# Patient Record
Sex: Female | Born: 1945 | Race: White | Hispanic: No | Marital: Married | Smoking: Former smoker
Health system: Southern US, Community
[De-identification: ages and names within clinical notes are randomized; demographics above are authoritative.]

## PROBLEM LIST (undated history)

## (undated) DIAGNOSIS — J449 Chronic obstructive pulmonary disease, unspecified: Secondary | ICD-10-CM

## (undated) DIAGNOSIS — D509 Iron deficiency anemia, unspecified: Secondary | ICD-10-CM

## (undated) DIAGNOSIS — R232 Flushing: Secondary | ICD-10-CM

## (undated) DIAGNOSIS — Z8489 Family history of other specified conditions: Secondary | ICD-10-CM

## (undated) DIAGNOSIS — R0600 Dyspnea, unspecified: Secondary | ICD-10-CM

## (undated) DIAGNOSIS — I10 Essential (primary) hypertension: Secondary | ICD-10-CM

## (undated) DIAGNOSIS — M1990 Unspecified osteoarthritis, unspecified site: Secondary | ICD-10-CM

## (undated) DIAGNOSIS — R51 Headache: Secondary | ICD-10-CM

## (undated) DIAGNOSIS — J189 Pneumonia, unspecified organism: Secondary | ICD-10-CM

## (undated) DIAGNOSIS — N189 Chronic kidney disease, unspecified: Secondary | ICD-10-CM

## (undated) DIAGNOSIS — K900 Celiac disease: Secondary | ICD-10-CM

## (undated) DIAGNOSIS — R519 Headache, unspecified: Secondary | ICD-10-CM

## (undated) HISTORY — DX: Unspecified osteoarthritis, unspecified site: M19.90

## (undated) HISTORY — PX: ECTOPIC PREGNANCY SURGERY: SHX613

## (undated) HISTORY — PX: COLONOSCOPY: SHX174

---

## 1998-09-01 ENCOUNTER — Other Ambulatory Visit: Admission: RE | Admit: 1998-09-01 | Discharge: 1998-09-01 | Payer: Self-pay | Admitting: Obstetrics and Gynecology

## 2003-03-18 ENCOUNTER — Encounter: Admission: RE | Admit: 2003-03-18 | Discharge: 2003-03-18 | Payer: Self-pay | Admitting: Family Medicine

## 2003-03-18 ENCOUNTER — Encounter: Payer: Self-pay | Admitting: Family Medicine

## 2003-05-05 ENCOUNTER — Encounter: Payer: Self-pay | Admitting: Family Medicine

## 2003-05-05 ENCOUNTER — Encounter: Admission: RE | Admit: 2003-05-05 | Discharge: 2003-05-05 | Payer: Self-pay | Admitting: Family Medicine

## 2004-05-27 ENCOUNTER — Other Ambulatory Visit: Admission: RE | Admit: 2004-05-27 | Discharge: 2004-05-27 | Payer: Self-pay | Admitting: Family Medicine

## 2005-07-17 ENCOUNTER — Other Ambulatory Visit: Admission: RE | Admit: 2005-07-17 | Discharge: 2005-07-17 | Payer: Self-pay | Admitting: Family Medicine

## 2006-07-23 ENCOUNTER — Other Ambulatory Visit: Admission: RE | Admit: 2006-07-23 | Discharge: 2006-07-23 | Payer: Self-pay | Admitting: Family Medicine

## 2007-07-30 ENCOUNTER — Other Ambulatory Visit: Admission: RE | Admit: 2007-07-30 | Discharge: 2007-07-30 | Payer: Self-pay | Admitting: Family Medicine

## 2008-09-08 ENCOUNTER — Other Ambulatory Visit: Admission: RE | Admit: 2008-09-08 | Discharge: 2008-09-08 | Payer: Self-pay | Admitting: Family Medicine

## 2009-09-09 ENCOUNTER — Other Ambulatory Visit: Admission: RE | Admit: 2009-09-09 | Discharge: 2009-09-09 | Payer: Self-pay | Admitting: Family Medicine

## 2009-09-21 ENCOUNTER — Encounter: Admission: RE | Admit: 2009-09-21 | Discharge: 2009-09-21 | Payer: Self-pay | Admitting: Family Medicine

## 2009-10-14 ENCOUNTER — Encounter: Admission: RE | Admit: 2009-10-14 | Discharge: 2009-10-14 | Payer: Self-pay | Admitting: Gastroenterology

## 2010-07-01 ENCOUNTER — Encounter: Admission: RE | Admit: 2010-07-01 | Discharge: 2010-07-01 | Payer: Self-pay | Admitting: Family Medicine

## 2010-07-25 ENCOUNTER — Encounter: Admission: RE | Admit: 2010-07-25 | Discharge: 2010-07-25 | Payer: Self-pay | Admitting: Family Medicine

## 2010-10-27 ENCOUNTER — Other Ambulatory Visit: Admission: RE | Admit: 2010-10-27 | Discharge: 2010-10-27 | Payer: Self-pay | Admitting: Family Medicine

## 2011-01-15 ENCOUNTER — Encounter: Payer: Self-pay | Admitting: Family Medicine

## 2011-02-08 ENCOUNTER — Other Ambulatory Visit: Payer: Self-pay | Admitting: Neurosurgery

## 2011-02-08 DIAGNOSIS — M545 Low back pain, unspecified: Secondary | ICD-10-CM

## 2011-02-08 DIAGNOSIS — M792 Neuralgia and neuritis, unspecified: Secondary | ICD-10-CM

## 2011-02-09 ENCOUNTER — Other Ambulatory Visit: Payer: Self-pay | Admitting: Neurosurgery

## 2011-02-09 ENCOUNTER — Ambulatory Visit
Admission: RE | Admit: 2011-02-09 | Discharge: 2011-02-09 | Disposition: A | Discharge status: Home / Self Care | Payer: BC Managed Care – PPO | Source: Ambulatory Visit | Attending: Neurosurgery | Admitting: Neurosurgery

## 2011-02-09 DIAGNOSIS — M545 Low back pain, unspecified: Secondary | ICD-10-CM

## 2011-02-09 DIAGNOSIS — M792 Neuralgia and neuritis, unspecified: Secondary | ICD-10-CM

## 2011-02-21 ENCOUNTER — Other Ambulatory Visit: Payer: Self-pay | Admitting: Neurosurgery

## 2011-02-21 DIAGNOSIS — M5410 Radiculopathy, site unspecified: Secondary | ICD-10-CM

## 2011-02-21 DIAGNOSIS — M549 Dorsalgia, unspecified: Secondary | ICD-10-CM

## 2011-02-21 DIAGNOSIS — M792 Neuralgia and neuritis, unspecified: Secondary | ICD-10-CM

## 2011-02-22 ENCOUNTER — Ambulatory Visit
Admission: RE | Admit: 2011-02-22 | Discharge: 2011-02-22 | Disposition: A | Discharge status: Home / Self Care | Payer: BC Managed Care – PPO | Source: Ambulatory Visit | Attending: Neurosurgery | Admitting: Neurosurgery

## 2011-02-22 DIAGNOSIS — M5410 Radiculopathy, site unspecified: Secondary | ICD-10-CM

## 2011-02-22 DIAGNOSIS — M549 Dorsalgia, unspecified: Secondary | ICD-10-CM

## 2011-02-22 DIAGNOSIS — M792 Neuralgia and neuritis, unspecified: Secondary | ICD-10-CM

## 2011-03-07 ENCOUNTER — Other Ambulatory Visit: Payer: Self-pay | Admitting: Neurosurgery

## 2011-03-07 DIAGNOSIS — M5410 Radiculopathy, site unspecified: Secondary | ICD-10-CM

## 2011-03-07 DIAGNOSIS — M549 Dorsalgia, unspecified: Secondary | ICD-10-CM

## 2011-03-10 ENCOUNTER — Other Ambulatory Visit: Payer: BC Managed Care – PPO

## 2011-04-27 ENCOUNTER — Inpatient Hospital Stay (HOSPITAL_COMMUNITY)
Admission: EM | Admit: 2011-04-27 | Discharge: 2011-04-29 | DRG: 316 | Disposition: A | Discharge status: Home / Self Care | Payer: BC Managed Care – PPO | Source: Ambulatory Visit | Attending: Family Medicine | Admitting: Family Medicine

## 2011-04-27 ENCOUNTER — Emergency Department (HOSPITAL_COMMUNITY): Payer: BC Managed Care – PPO

## 2011-04-27 DIAGNOSIS — I10 Essential (primary) hypertension: Secondary | ICD-10-CM | POA: Diagnosis present

## 2011-04-27 DIAGNOSIS — N179 Acute kidney failure, unspecified: Principal | ICD-10-CM | POA: Diagnosis present

## 2011-04-27 DIAGNOSIS — K900 Celiac disease: Secondary | ICD-10-CM | POA: Diagnosis present

## 2011-04-27 DIAGNOSIS — D539 Nutritional anemia, unspecified: Secondary | ICD-10-CM | POA: Diagnosis present

## 2011-04-27 DIAGNOSIS — G8929 Other chronic pain: Secondary | ICD-10-CM | POA: Diagnosis present

## 2011-04-27 DIAGNOSIS — M549 Dorsalgia, unspecified: Secondary | ICD-10-CM | POA: Diagnosis present

## 2011-04-27 DIAGNOSIS — Z880 Allergy status to penicillin: Secondary | ICD-10-CM

## 2011-04-27 LAB — BASIC METABOLIC PANEL
BUN: 70 mg/dL — ABNORMAL HIGH (ref 6–23)
CO2: 20 mEq/L (ref 19–32)
Calcium: 10.5 mg/dL (ref 8.4–10.5)
Chloride: 102 mEq/L (ref 96–112)
Creatinine, Ser: 5.65 mg/dL — ABNORMAL HIGH (ref 0.4–1.2)
GFR calc Af Amer: 9 mL/min — ABNORMAL LOW (ref >60)
GFR calc non Af Amer: 8 mL/min — ABNORMAL LOW (ref >60)
Glucose, Bld: 98 mg/dL (ref 70–99)
Potassium: 4.1 mEq/L (ref 3.5–5.1)
Sodium: 135 mEq/L (ref 135–145)

## 2011-04-27 LAB — CBC
HCT: 30.6 % — ABNORMAL LOW (ref 36.0–46.0)
Hemoglobin: 10.1 g/dL — ABNORMAL LOW (ref 12.0–15.0)
MCH: 33 pg (ref 26.0–34.0)
MCHC: 33 g/dL (ref 30.0–36.0)
MCV: 100 fL (ref 78.0–100.0)
Platelets: 230 10*3/uL (ref 150–400)
RBC: 3.06 MIL/uL — ABNORMAL LOW (ref 3.87–5.11)
RDW: 13 % (ref 11.5–15.5)
WBC: 9.2 10*3/uL (ref 4.0–10.5)

## 2011-04-27 LAB — URINALYSIS, ROUTINE W REFLEX MICROSCOPIC
Bilirubin Urine: NEGATIVE
Glucose, UA: NEGATIVE mg/dL
Hgb urine dipstick: NEGATIVE
Ketones, ur: NEGATIVE mg/dL
Nitrite: NEGATIVE
Protein, ur: NEGATIVE mg/dL
Specific Gravity, Urine: 1.017 (ref 1.005–1.030)
Urobilinogen, UA: 0.2 mg/dL (ref 0.0–1.0)
pH: 5.5 (ref 5.0–8.0)

## 2011-04-27 LAB — URINE MICROSCOPIC-ADD ON

## 2011-04-27 LAB — DIFFERENTIAL
Basophils Absolute: 0 10*3/uL (ref 0.0–0.1)
Basophils Relative: 0 % (ref 0–1)
Eosinophils Absolute: 0.7 10*3/uL (ref 0.0–0.7)
Eosinophils Relative: 7 % — ABNORMAL HIGH (ref 0–5)
Lymphocytes Relative: 30 % (ref 12–46)
Lymphs Abs: 2.8 10*3/uL (ref 0.7–4.0)
Monocytes Absolute: 0.8 10*3/uL (ref 0.1–1.0)
Monocytes Relative: 9 % (ref 3–12)
Neutro Abs: 4.9 10*3/uL (ref 1.7–7.7)
Neutrophils Relative %: 54 % (ref 43–77)

## 2011-04-28 ENCOUNTER — Other Ambulatory Visit: Payer: Self-pay | Admitting: Internal Medicine

## 2011-04-28 LAB — URINALYSIS, ROUTINE W REFLEX MICROSCOPIC
Bilirubin Urine: NEGATIVE
Glucose, UA: NEGATIVE mg/dL
Hgb urine dipstick: NEGATIVE
Ketones, ur: NEGATIVE mg/dL
Nitrite: NEGATIVE
Protein, ur: NEGATIVE mg/dL
Specific Gravity, Urine: 1.01 (ref 1.005–1.030)
Urobilinogen, UA: 0.2 mg/dL (ref 0.0–1.0)
pH: 6 (ref 5.0–8.0)

## 2011-04-28 LAB — URINE MICROSCOPIC-ADD ON

## 2011-04-28 LAB — BASIC METABOLIC PANEL
BUN: 66 mg/dL — ABNORMAL HIGH (ref 6–23)
CO2: 22 mEq/L (ref 19–32)
Calcium: 9.7 mg/dL (ref 8.4–10.5)
Chloride: 104 mEq/L (ref 96–112)
Creatinine, Ser: 5.18 mg/dL — ABNORMAL HIGH (ref 0.4–1.2)
GFR calc Af Amer: 10 mL/min — ABNORMAL LOW (ref >60)
GFR calc non Af Amer: 8 mL/min — ABNORMAL LOW (ref >60)
Glucose, Bld: 113 mg/dL — ABNORMAL HIGH (ref 70–99)
Potassium: 3.7 mEq/L (ref 3.5–5.1)
Sodium: 137 mEq/L (ref 135–145)

## 2011-04-28 LAB — CREATININE, URINE, RANDOM: Creatinine, Urine: 30.19 mg/dL

## 2011-04-28 LAB — CREATININE, URINE, 24 HOUR: Creatinine, Urine: 30.19 mg/dL

## 2011-04-28 LAB — VITAMIN B12: Vitamin B-12: 373 pg/mL (ref 211–911)

## 2011-04-28 LAB — SODIUM, URINE, RANDOM: Sodium, Ur: 61 mEq/L

## 2011-04-29 LAB — RENAL FUNCTION PANEL
Albumin: 3 g/dL — ABNORMAL LOW (ref 3.5–5.2)
BUN: 51 mg/dL — ABNORMAL HIGH (ref 6–23)
CO2: 21 mEq/L (ref 19–32)
Calcium: 9.7 mg/dL (ref 8.4–10.5)
Chloride: 112 mEq/L (ref 96–112)
Creatinine, Ser: 3.9 mg/dL — ABNORMAL HIGH (ref 0.4–1.2)
GFR calc Af Amer: 14 mL/min — ABNORMAL LOW (ref >60)
GFR calc non Af Amer: 12 mL/min — ABNORMAL LOW (ref >60)
Glucose, Bld: 101 mg/dL — ABNORMAL HIGH (ref 70–99)
Phosphorus: 4.8 mg/dL — ABNORMAL HIGH (ref 2.3–4.6)
Potassium: 4 mEq/L (ref 3.5–5.1)
Sodium: 142 mEq/L (ref 135–145)

## 2011-04-29 LAB — CBC
HCT: 27.9 % — ABNORMAL LOW (ref 36.0–46.0)
Hemoglobin: 8.9 g/dL — ABNORMAL LOW (ref 12.0–15.0)
MCH: 32.4 pg (ref 26.0–34.0)
MCHC: 31.9 g/dL (ref 30.0–36.0)
MCV: 101.5 fL — ABNORMAL HIGH (ref 78.0–100.0)
Platelets: 206 10*3/uL (ref 150–400)
RBC: 2.75 MIL/uL — ABNORMAL LOW (ref 3.87–5.11)
RDW: 13 % (ref 11.5–15.5)
WBC: 7 10*3/uL (ref 4.0–10.5)

## 2011-05-01 LAB — ANA: Anti Nuclear Antibody(ANA): NEGATIVE

## 2011-05-01 NOTE — H&P (Signed)
[name], [name]              ACCOUNT NO.:  [id number]  MEDICAL RECORD NO.:  [id number]           PATIENT TYPE:  E  LOCATION:  [facility]                         FACILITY:  [facility]  PHYSICIAN:  Orvan Falconer, MD           DATE OF BIRTH:  September 08, 1946  DATE OF ADMISSION:  04/27/2011 DATE OF DISCHARGE:                             HISTORY & PHYSICAL   PRIMARY CARE PHYSICIAN:  Milford Cage. Laurann Montana, MD  ADVANCE DIRECTIVES:  Full code.  REASON FOR ADMISSION:  Acute renal failure.  HISTORY OF PRESENT ILLNESS:  This is a 65 year old female with benign past medical history including only hypertension, chronic back pain, and recently diagnosed of celiac disease, presents to the emergency room at the recommendation of her primary care physician, Dr. [name], for acute renal failure.  She is rather asymptomatic.  She stated she had some left flank pain about 3-4 days ago and did go to the Urgent Care where reportedly had a negative urinalysis.  She said that no lab work was done at that time.  She did well with that and subsequently followed up with her primary care physician for routine followup.  She stated she was rather asymptomatic, but Dr. Laurann Montana has recommended that she get blood work every 6 months.  This routine lab work showed elevated creatinine, and she was recommended to go to the emergency room. She has been on Micardis HCT and reportedly no additional medication was added onto her regimen, but she did have an increase in the dosages a few months ago.  She denied headache, nausea, or vomiting.  She has no dysuria, polyuria, abdominal cramps or pain, nausea, or vomiting.  She denied any weight loss or any other symptomatology.  She has not taken any over-the-counter medications such as any nonsteroidal antiinflammatory drugs.  Evaluation in the emergency room showed a negative urinalysis (no casts, no protein, no red blood cells), creatinine of 5.65, BUN of 70, potassium of 4.1.   Her serum sodium was 135.  She has a normal white count of 9200 and hemoglobin of 10.1.  Her calcium is 10.5 and platelet count is 230,000.  Hospitalist was asked to admit the patient for acute renal failure.  PAST MEDICAL HISTORY:  Celiac sprue, hypertension, chronic back pain, but otherwise benign.  SOCIAL HISTORY:  She denied tobacco, alcohol, or drug use.  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  She has 1 daughter and several grandchildren.  REVIEW OF SYSTEMS:  Otherwise, unremarkable.  Note that she does not have any symptoms of congestive heart failure, chest pain, shortness of breath, any evidence of infection, and she has no peripheral edema.  CURRENT MEDICATIONS:  Benefiber, calcium, Estratest, gabapentin, glucosamine, hydrocodone, acetaminophen, Micardis HCT 80/12.5 mg per day.  Prior, she was on norethindrone acetate but this was discontinued, omeprazole, and probiotic.  PHYSICAL EXAMINATION:  VITAL SIGNS:  Blood pressure 120/90, pulse of 83, respiratory rate 16, temp 98.1. GENERAL:  Shows that she is alert and oriented, sitting up in her bed, eating her dinner. HEENT:  Her sclerae are nonicteric.  Pupils round  and reactive to light. Throat is clear.  She has no petechiae.  No periorbital edema. NECK:  Supple.  She has no thyromegaly. CARDIAC:  Revealed S1 and S2.  I did not hear any friction rub. LUNGS:  Clear bilaterally.  No rales. ABDOMEN:  Showed no renal bruit.  No palpable mass.  She does not have any flank tenderness. EXTREMITIES:  Showed no edema.  She has no calf tenderness.  She has good distal pulses bilaterally. SKIN:  Warm and dry.  No asterixis and no evidence of chronic liver disease.  OBJECTIVE FINDINGS:  Urinalysis showed 0-2 wbc's, 0-2 rbc's, rare bacteria, negative granular cast, specific gravity 1.017 with pH of 5.5. Serum sodium 135, potassium 4.1, glucose of 98, BUN 70, creatinine 5.65, calcium of 10.5.  White count of 9200,  hemoglobin 10.1, MCV of 100, platelet count 230,000.  IMPRESSION:  This is a 65 year old with hypertension, on Micardis HCT, presented with acute renal failure.  The cause is unclear and it could be prerenal, renal, or even postrenal.  Note that she is on Micardis HCT.  Her potassium is okay, but she does have elevated both BUN and creatinine.  She has no clinical evidence suggestive of multiple myeloma although she does have anemia and now acute renal failure.  I would like to get a renal ultrasound to exclude hydronephrosis.  She will need to have fluid challenge and will give normal saline.  We will stop her Micardis HCT.  If her creatinine does not improve or worsen, please get a Renal consult.  Cannot exclude malignancy, although her calcium is normal and she has no weight loss.  We also need to exclude bilateral renal stenosis also, but right now her creatinine is too high to do any meaningful tests.  As a reminder, she was diagnosed with celiac sprue and thus we will give her gluten-free diet.  I would like to do a UPEP and SPEP as well.  It is also possible that she could have a stone causing obstruction.  Currently, she is stable and will be admitted to team 6.  She is a full code.     Orvan Falconer, MD     PL/MEDQ  D:  04/27/2011  T:  04/27/2011  Job:  [id number]  cc:   Milford Cage. Laurann Montana, M.D.  Electronically Signed by Orvan Falconer  on 05/01/2011 03:39:25 AM

## 2011-05-02 LAB — UIFE/LIGHT CHAINS/TP QN, 24-HR UR
Albumin, U: DETECTED
Alpha 1, Urine: DETECTED — AB
Alpha 2, Urine: DETECTED — AB
Beta, Urine: DETECTED — AB
Free Kappa Lt Chains,Ur: 13.5 mg/dL — ABNORMAL HIGH (ref 0.04–1.51)
Free Lambda Lt Chains,Ur: 1.74 mg/dL — ABNORMAL HIGH (ref 0.08–1.01)
Gamma Globulin, Urine: DETECTED — AB
Total Protein, Urine: 16.3 mg/dL

## 2011-05-02 LAB — PROTEIN ELECTROPH W RFLX QUANT IMMUNOGLOBULINS
Albumin ELP: 54.1 % — ABNORMAL LOW (ref 55.8–66.1)
Alpha-1-Globulin: 4.1 % (ref 2.9–4.9)
Alpha-2-Globulin: 11 % (ref 7.1–11.8)
Beta 2: 4.3 % (ref 3.2–6.5)
Beta Globulin: 5 % (ref 4.7–7.2)
Gamma Globulin: 21.5 % — ABNORMAL HIGH (ref 11.1–18.8)
M-Spike, %: NOT DETECTED g/dL
Total Protein ELP: 7 g/dL (ref 6.0–8.3)

## 2011-05-02 LAB — PROTEIN ELECTROPHORESIS, SERUM
Albumin ELP: 54.8 % — ABNORMAL LOW (ref 55.8–66.1)
Alpha-1-Globulin: 4 % (ref 2.9–4.9)
Alpha-2-Globulin: 10.9 % (ref 7.1–11.8)
Beta 2: 4.7 % (ref 3.2–6.5)
Beta Globulin: 4.3 % — ABNORMAL LOW (ref 4.7–7.2)
Gamma Globulin: 21.3 % — ABNORMAL HIGH (ref 11.1–18.8)
M-Spike, %: NOT DETECTED g/dL
Total Protein ELP: 6.3 g/dL (ref 6.0–8.3)

## 2011-05-02 LAB — IMMUNOFIXATION ADD-ON

## 2011-05-03 LAB — IGG, IGA, IGM
IgA: 191 mg/dL (ref 68–378)
IgG (Immunoglobin G), Serum: 1630 mg/dL — ABNORMAL HIGH (ref 700–1600)
IgM, Serum: 53 mg/dL — ABNORMAL LOW (ref 60–263)

## 2011-05-08 NOTE — Discharge Summary (Signed)
[name], [name]              ACCOUNT NO.:  [id number]  MEDICAL RECORD NO.:  [id number]           PATIENT TYPE:  I  LOCATION:  4854                         FACILITY:  [facility]  PHYSICIAN:  Debbe Odea, M.D.     DATE OF BIRTH:  09-16-1946  DATE OF ADMISSION:  04/27/2011 DATE OF DISCHARGE:  04/29/2011                              DISCHARGE SUMMARY   PRIMARY CARE PHYSICIAN:  Milford Cage. Laurann Montana, MD  CONSULT:  Nephrology.  PRESENTING COMPLAINT:  Sent to the hospital for acute renal failure.  DISCHARGE DIAGNOSIS:  Acute renal failure.  Etiology uncertain.  PAST MEDICAL HISTORY: 1. Hypertension. 2. Celiac sprue. 3. Chronic back pain.  DISCHARGE MEDICATIONS:  The following medications have been held on recommendations by Nephrology; 1. Micardis HCT. 2. Omeprazole  Continue the following home meds; 1. Benefiber OTC 2 tablespoons each morning. 2. Calcium carbonate/vitamin D OTC 1 tab daily with meal. 3. Gabapentin 300 mg twice a day. 4. Glucosamine HCl 500 mg daily. 5. Hydrocodone/acetaminophen 5/325 one tab q.6 h. as needed for pain. 6. Probiotic OTC daily.  HOSPITAL COURSE:  This is a 65 year old female who went into her doctor's office for routine blood work and was noted to have acute renal failure and therefore referred to the ER.  In the ER, her BUN was noted to be 70 and creatinine 5.65.  The patient was admitted for further workup.  Further workup included ultrasound of her kidneys, which does not reveal any renal obstructions.  Kidney showed no significant atrophy, but do demonstrate suggestion of mildly increased cortical echogenicity, which may be consistent with chronic kidney disease.  The patient's BUN/creatinine have improved to 51 and 3.90.  On discharge today, she has been evaluated by Nephrology and as mentioned above current medications have been held.  Dr. Jonnie Finner is covering for [facility] today and states that the patient will receive a  call from the office regarding a followup appointment.  For now, she is to follow up with her primary care physician next week for repeat BUN and creatinine.  PERTINENT BLOOD WORK:  We were hydrating her since admission and a hemoglobin has dropped from 10.1 to 8.9 today.  LABORATORY DATA:  Urinary creatinine was obtained and found to be 30.19. Urine sodium was 61.  UA revealed small amount of leukocytes and rare bacteria.  PHYSICAL EXAMINATION:  On discharge; LUNGS:  Clear bilaterally. HEART:  Regular rate and rhythm.  No murmurs. ABDOMEN:  Soft, nontender, and nondistended.  Bowel sounds positive. EXTREMITIES:  No cyanosis, clubbing, or edema.  Of note, the patient does complain of left lower back pain, which she through was her kidney, but this is reproducible and changes with movement; therefore, is musculoskeletal.  CONDITION ON DISCHARGE:  Stable.  FOLLOWUP INSTRUCTIONS:  Followup with Dr. [name], next week for a metabolic panel.  In addition, blood pressure should be checked and medications appropriately prescribed if high.  Blood pressure during her hospital stay has been normal or low normal and this morning was 115/70.  TIME ON DISCHARGE:  45 minutes.     Debbe Odea, M.D.  SR/MEDQ  D:  04/29/2011  T:  04/29/2011  Job:  [id number]  cc:   Milford Cage. Laurann Montana, M.D.  Electronically Signed by Debbe Odea M.D. on 05/08/2011 11:17:20 AM

## 2011-05-18 NOTE — Consult Note (Signed)
[name], [name]              ACCOUNT NO.:  [id number]  MEDICAL RECORD NO.:  [id number]           PATIENT TYPE:  I  LOCATION:  5643                         FACILITY:  [facility]  PHYSICIAN:  Elmarie Shiley, MD          DATE OF BIRTH:  1946/05/26  DATE OF CONSULTATION:  04/28/2011 DATE OF DISCHARGE:                                [place]   Nephrology was consulted by Debbe Odea, MD for the evaluation and management of [name]'s acute renal insufficiency.  The patient's primary care provider is Dr. Margaretha Sheffield C. Laurann Montana, MD  HISTORY OF PRESENT ILLNESS:  [name] is a 65 year old Caucasian woman with past medical history significant for hypertension for several years, chronic back pain and history of celiac disease that was diagnosed about a year ago.  She was admitted last night after being found to have acute renal failure with an elevated creatinine at 5.6 (unknown baseline at this time as labs are unavailable).  The patient states that about 3-4 days prior to presentation for her labs, she was seen at a local Urgent [facility] for worsening of left flank pain which by urinalysis was found to be negative for urinary tract infection.  She states that was the only lab done at that visit.  Other than poor appetite, nausea and disguise she had for the last 2 weeks, [name] denies any newly emerging symptoms.  She explicitly denies any hematuria, foamy urine or dysuria/urgency or frequency.  She denies any prior history of acute renal failure, denies history or knowledge of proteinuria and denies any history of kidney stones.  She states that about 6 months ago, Micardis therapy was uptitrated to help get better blood pressure control and she states that she has so far tolerated it well.  Four  months ago, she was started on omeprazole for gastroesophageal reflux.  She denies any intercurrent use of NSAIDs or any recent intravenous contrast exposure.  She denies any  persistent diarrhea arising from her celiac disease and she states that she does not use any laxatives or enemas.  Other than her prescribed medications, she will occasionally uses probiotics.  PAST MEDICAL HISTORY: 1. Hypertension. 2. Celiac disease. 3. Chronic back pain.  MEDICATIONS: 1. Micardis/HCT 80/12.5 mg p.o. daily. 2. Omeprazole 40 mg p.o. daily. 3. Probiotic daily. 4. Hydrocodone/Tylenol 5/325 q.6 h. p.r.n. 5. Gabapentin 300 mg p.o. b.i.d. 6. Calcium carbonate/vitamin D over-the-counter 1 tablet daily 7. Glucosamine 500 mg p.o. daily  ALLERGIES:  PENICILLIN gives her a cutaneous rash.  SOCIAL HISTORY:  She is married and resides at home with her husband. 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  Has 1 daughter and 2 grandchildren of 19 and 52 years of age.  Denies any tobacco, alcohol or illicit drug abuse.  FAMILY HISTORY:  No significant history of kidney disease in the family. Several first-degree family members with high blood pressure.  REVIEW OF SYSTEMS:  Extensive review of systems was done (14 systems), negative other than the HPI above.  PHYSICAL EXAMINATION:  VITAL SIGNS:  Temperature of 98 degrees Fahrenheit, pulse of 91, respiratory rate  of 20, blood pressures spanning 118-129/52-68.  Oxygen saturation 99% on room air. GENERAL EVALUATION:  Middle-aged Caucasian woman of small-built, resting comfortably in bed. HEAD, NECK AND ENT SYSTEM:  Head is normocephalic and atraumatic. Pupils are bilaterally equal and reactive to light.  Extraocular muscle movements are normal.  Funduscopy was not done.  On examination of her neck, supple without any obvious JVD or goiter.  No lymphadenopathy.  No bruit. CARDIOVASCULAR ASSESSMENT:  Pulses regular in rate and rhythm.  Heart sounds S1 and S2 are normal without any murmurs, rubs or gallops. RESPIRATORY EVALUATION:  Both lung fields are clear to auscultation.  No rales, retractions or rhonchi. ABDOMEN:  Soft, flat,  nontender without any organomegaly and bowel sounds are normal.  Some left costovertebral angle tenderness is noted on percussion. EXTREMITIES:  No peculiar rashes.  No lumps.  No bumps.  No edema. SKIN:  Over her abdominal surface and thighs, no peculiar rashes are noted.  LABORATORY DATA:  Sodium 137, potassium 3.7, bicarbonate 22, BUN 66, creatinine 5.2, glucose 113, calcium 9.7.  Urinalysis, specific gravity 1010, pH of 6.0, negative protein, negative blood, small amount of leukocytes.  Urine microscopy reveals 3-6 white cells per high-power field and a few rare squamous epithelial cells.  Random urinary sodium is 61, urine creatinine of 31.  BNP yesterday showed a creatinine of 5.6 while the urinalysis sediment showed a granular cast.  CBC done on Apr 27, 2011, showed a hemoglobin of 10.1, hematocrit 30.6, white cell count 9200, platelet count 230,000%, and 7% eosinophils.  ASSESSMENT AND PLAN: 1. Acute renal failure.  I am awaiting labs to establish this     patient's renal baseline, however, per the patient, she apparently     has had a normal renal function 6 months ago when labs were last     checked.  No clear indications or history is obtained of volume     depletion, but I agree with holding her Micardis/HCT and giving her     intravenous fluids to help resuscitate any or occult volume     depletion.  I am most concerned with her eosinophilia on the CBC and leukocytes in her urine with a concern raised regarding acute interstitial nephritis likely from recently started omeprazole therapy.  This has been discontinued for now and I would recommend to continue holding this.  If creatinine does not consistently drop after holding this medication and resuscitating with intravenous fluids, there may be need for a kidney biopsy to justify corticosteroid use, particularly for AIN.  At this juncture, this is likely to be a challenging venture to undertake over the weekend.  I  reviewed her renal ultrasound that does not display any obstruction, but shows an increased cortical echogenicity that may point either to chronic kidney disease or an acute interstitial nephritis. Her renal size has been relatively stable since her previous renal ultrasound from 2010.  There is no hematuria, no red cells or red cell casts seen on the urinary sediment that would point towards a glomerulonephritis and as such the differential for this is very low. Urine studies also so far negative for proteinuria.  1. Hypertension.  She currently continues to have fairly acceptable     blood pressures of antihypertensive therapy.  We will continue to     monitor this and may use p.r.n. hydralazine for acute blood     pressure elevations.  1. Macrocytic anemia.  I agree with checking a folic acid level.  Vitamin B12 level checked was within fair limits.  No clinical     evidence is noted overt losses, however, I suspect that with her     celiac sprue she likely has chronic malabsorption.     Elmarie Shiley, MD     JP/MEDQ  D:  04/28/2011  T:  04/29/2011  Job:  [id number]  cc:   Milford Cage. Laurann Montana, M.D.  Electronically Signed by Elmarie Shiley MD on 05/18/2011 03:59:09 PM

## 2014-03-02 ENCOUNTER — Other Ambulatory Visit: Payer: Self-pay | Admitting: Family Medicine

## 2014-03-02 ENCOUNTER — Ambulatory Visit
Admission: RE | Admit: 2014-03-02 | Discharge: 2014-03-02 | Disposition: A | Discharge status: Home / Self Care | Payer: Commercial Managed Care - HMO | Source: Ambulatory Visit | Attending: Family Medicine | Admitting: Family Medicine

## 2014-03-02 DIAGNOSIS — T148XXA Other injury of unspecified body region, initial encounter: Secondary | ICD-10-CM

## 2014-08-03 ENCOUNTER — Encounter: Payer: Self-pay | Admitting: Podiatry

## 2014-08-03 ENCOUNTER — Ambulatory Visit (INDEPENDENT_AMBULATORY_CARE_PROVIDER_SITE_OTHER): Payer: Commercial Managed Care - HMO | Admitting: Podiatry

## 2014-08-03 ENCOUNTER — Ambulatory Visit (INDEPENDENT_AMBULATORY_CARE_PROVIDER_SITE_OTHER): Payer: Commercial Managed Care - HMO

## 2014-08-03 VITALS — BP 113/63 | HR 92 | Resp 18

## 2014-08-03 DIAGNOSIS — M79673 Pain in unspecified foot: Secondary | ICD-10-CM

## 2014-08-03 DIAGNOSIS — M79609 Pain in unspecified limb: Secondary | ICD-10-CM | POA: Diagnosis not present

## 2014-08-03 DIAGNOSIS — M722 Plantar fascial fibromatosis: Secondary | ICD-10-CM | POA: Diagnosis not present

## 2014-08-03 NOTE — Patient Instructions (Signed)
Replace athletic style shoe Insert over-the-counter soft pads in shoes If pain persists in arches and ball area return for custom foot orthotics

## 2014-08-03 NOTE — Progress Notes (Signed)
   Subjective:    Patient ID: [name], female    DOB: Nov 29, 1946, 68 y.o.   MRN: [id number]  HPI my feet hurt by mid afternoon and pin needles and burning and they hurt on the bottom of both feet and feel like I am losing the padding and been going on for about 4 weeks and throbbing and some shooting pain and my toenails are thick some    Review of Systems  Musculoskeletal:       Joint pain  Allergic/Immunologic: Positive for food allergies.  Hematological: Bruises/bleeds easily.  All other systems reviewed and are negative.      Objective:   Physical Exam  Orientated x3 white female  Vascular: DP and PT pulses 2/4 bilaterally  Neurological: Sensation to 10 gram monofilament wire intact 5/5 bilaterally Vibratory sensation intact bilaterally Ankle reflex equal and reactive bilaterally  Dermatological Mild atrophy of plantar fat pad MPJ bilaterally Hypertrophic left hallux toenail  Musculoskeletal: Mild palpable tenderness plantar second to fourth MPJ without any palpable lesions Palpable tenderness plantar fascia mid arch without any palpable lesions bilaterally  X-ray examination left foot  Intact bony structures without fracture and/or dislocation  HAV deformity  Tailor's bunion  Medial Transverse plaine position of the second toe  Radiographic impression:  No acute bony abnormality noted in the left foot    X-ray report weightbearing right foot  Intact bony structures fracture and/or dislocation  Metatarsus adductus  Mild HAV  Medial transverse plane positioning of the lateral toes  Radiographic impression:  No acute bony abnormality noted in the right foot     Assessment & Plan:    Assessment: Satisfactory neurovascular status bilaterally Atrophic fad pad MPJ bilaterally Metatarsalgia bilaterally Plantar fasciitis bilaterally  Plan: Patient is advised to purchase new athletic shoes and place soft over-the-counter arch pads in the  shoes.  If the MPJ and arch pain does not improvet return for custom orthotic which would include a metatarsal raise

## 2014-08-04 ENCOUNTER — Encounter: Payer: Self-pay | Admitting: Podiatry

## 2015-01-26 ENCOUNTER — Telehealth: Payer: Self-pay | Admitting: Hematology

## 2015-01-26 NOTE — Telephone Encounter (Signed)
LEFT PT VM IN REF TO NP APPT.

## 2015-01-26 NOTE — Telephone Encounter (Signed)
S/W PT IN REF TO NP APPT ON 02/04/15@10 :30 REFERRING DR GRIFFIN DX-ANEMIA,THROMBOCYTOPENIA

## 2015-02-04 ENCOUNTER — Ambulatory Visit (HOSPITAL_BASED_OUTPATIENT_CLINIC_OR_DEPARTMENT_OTHER): Payer: Commercial Managed Care - HMO | Admitting: Hematology

## 2015-02-04 ENCOUNTER — Encounter (INDEPENDENT_AMBULATORY_CARE_PROVIDER_SITE_OTHER): Payer: Self-pay

## 2015-02-04 ENCOUNTER — Telehealth: Payer: Self-pay | Admitting: Hematology

## 2015-02-04 ENCOUNTER — Ambulatory Visit (HOSPITAL_COMMUNITY)
Admission: RE | Admit: 2015-02-04 | Discharge: 2015-02-04 | Disposition: A | Discharge status: Home / Self Care | Payer: Commercial Managed Care - HMO | Source: Ambulatory Visit | Attending: Hematology | Admitting: Hematology

## 2015-02-04 ENCOUNTER — Ambulatory Visit: Payer: Commercial Managed Care - HMO

## 2015-02-04 ENCOUNTER — Encounter: Payer: Self-pay | Admitting: Hematology

## 2015-02-04 ENCOUNTER — Ambulatory Visit (HOSPITAL_BASED_OUTPATIENT_CLINIC_OR_DEPARTMENT_OTHER): Payer: Commercial Managed Care - HMO

## 2015-02-04 VITALS — BP 150/58 | HR 72 | Temp 98.0°F | Resp 17 | Ht 62.0 in | Wt 140.0 lb

## 2015-02-04 DIAGNOSIS — D509 Iron deficiency anemia, unspecified: Secondary | ICD-10-CM | POA: Diagnosis not present

## 2015-02-04 DIAGNOSIS — D696 Thrombocytopenia, unspecified: Secondary | ICD-10-CM

## 2015-02-04 DIAGNOSIS — N189 Chronic kidney disease, unspecified: Secondary | ICD-10-CM

## 2015-02-04 DIAGNOSIS — D649 Anemia, unspecified: Secondary | ICD-10-CM

## 2015-02-04 DIAGNOSIS — D631 Anemia in chronic kidney disease: Secondary | ICD-10-CM

## 2015-02-04 LAB — CBC & DIFF AND RETIC
BASO%: 0.1 % (ref 0.0–2.0)
Basophils Absolute: 0 10*3/uL (ref 0.0–0.1)
EOS%: 1.5 % (ref 0.0–7.0)
Eosinophils Absolute: 0.1 10*3/uL (ref 0.0–0.5)
HCT: 34.4 % — ABNORMAL LOW (ref 34.8–46.6)
HGB: 11.2 g/dL — ABNORMAL LOW (ref 11.6–15.9)
Immature Retic Fract: 7.4 % (ref 1.60–10.00)
LYMPH%: 39.4 % (ref 14.0–49.7)
MCH: 34.7 pg — ABNORMAL HIGH (ref 25.1–34.0)
MCHC: 32.6 g/dL (ref 31.5–36.0)
MCV: 106.5 fL — ABNORMAL HIGH (ref 79.5–101.0)
MONO#: 1 10*3/uL — ABNORMAL HIGH (ref 0.1–0.9)
MONO%: 11.2 % (ref 0.0–14.0)
NEUT#: 4.3 10*3/uL (ref 1.5–6.5)
NEUT%: 47.8 % (ref 38.4–76.8)
Platelets: 122 10*3/uL — ABNORMAL LOW (ref 145–400)
RBC: 3.23 10*6/uL — ABNORMAL LOW (ref 3.70–5.45)
RDW: 14.3 % (ref 11.2–14.5)
Retic %: 1.99 % (ref 0.70–2.10)
Retic Ct Abs: 64.28 10*3/uL (ref 33.70–90.70)
WBC: 9.1 10*3/uL (ref 3.9–10.3)
lymph#: 3.6 10*3/uL — ABNORMAL HIGH (ref 0.9–3.3)

## 2015-02-04 LAB — COMPREHENSIVE METABOLIC PANEL (CC13)
ALT: 19 U/L (ref 0–55)
AST: 27 U/L (ref 5–34)
Albumin: 4.4 g/dL (ref 3.5–5.0)
Alkaline Phosphatase: 62 U/L (ref 40–150)
Anion Gap: 13 mEq/L — ABNORMAL HIGH (ref 3–11)
BUN: 19.3 mg/dL (ref 7.0–26.0)
CO2: 24 mEq/L (ref 22–29)
Calcium: 10.1 mg/dL (ref 8.4–10.4)
Chloride: 105 mEq/L (ref 98–109)
Creatinine: 1.2 mg/dL — ABNORMAL HIGH (ref 0.6–1.1)
EGFR: 45 mL/min/{1.73_m2} — ABNORMAL LOW (ref >90)
Glucose: 137 mg/dl (ref 70–140)
Potassium: 3.9 mEq/L (ref 3.5–5.1)
Sodium: 143 mEq/L (ref 136–145)
Total Bilirubin: 0.54 mg/dL (ref 0.20–1.20)
Total Protein: 7.3 g/dL (ref 6.4–8.3)

## 2015-02-04 LAB — MORPHOLOGY
PLT EST: DECREASED
RBC Comments: NORMAL

## 2015-02-04 LAB — LACTATE DEHYDROGENASE (CC13): LDH: 230 U/L (ref 125–245)

## 2015-02-04 LAB — PROTIME-INR
INR: 1.08 (ref 0.00–1.49)
Prothrombin Time: 14.1 seconds (ref 11.6–15.2)

## 2015-02-04 NOTE — Progress Notes (Signed)
  Attending addendum  I have seen the patient, examined her with resident Dr. Alice Rieger.  I agree with the assessment and and plan and have edited the notes.   In summary, [name] is a 69 year old female without significant past medical history except arthritis, who was found to have anemia and thrombocytopenia since 2013. Her hemoglobin has a being in the range of 10.7-11.1, platelet count was 141 in August 2013, and it dropped to 89 on generally 28th 2016. She also has chronic renal failure with creatinine 1.33 on 01/21/2015.  The differential of her anemia and thrombocytopenia is brought, including anemia of chronic disease, megaloblastic anemia, MDS, multiple myeloma, hemochromatosis, liver disease. I recommend following workup. -lab: CBC with diff and ret, LDH, M03, folic acid, methylmalonic acid, epo, TSH, SPEP/UPEP, hemochromatosis DNA, and morphology -US liver and spleen -bone marrow biopsy by IR  I'll plan to see her back in 1 months to discuss the above test results.  I answered all her questions to her satisfaction. I spent a total of 45 minutes, >50% on face to face counseling.  Truitt Merle  02/04/2015

## 2015-02-04 NOTE — Progress Notes (Signed)
Checked in new pt with no financial concerns. Pt is here for a hematology concern so financial assistance may not be needed but she has my card for any billing questions or concerns.

## 2015-02-04 NOTE — Progress Notes (Signed)
[place]  [phone] Fax:([phone]) [place] consult Note   Patient Care Team: [name], MD as PCP - General (Family Medicine) 02/04/2015  CHIEF COMPLAINTS/PURPOSE OF CONSULTATION:  Low hemoglobin and low platelets count at her PCP's office   HISTORY OF PRESENTING ILLNESS: [name] 69 y.o. female is here because of she was found to have a low hemoglobin and low platelet count on her recent labs from Dr Delene Ruffini office. She was found to have abnormal CBC from 01/21/2015 with WBC of 7.0, hemoglobin of 10.7, and platelets of 89. She is otherwise asymptomatic. She has a history of iron deficiency anemia and celiac disease. Review of her labs indicates:  2013: normal vitamin B12 and folate acid level.  July 2015: ferritin 1429.2, iron 205, iron sats 82%, transferrin 179.  01/21/2015: creatinine 1.33 (she has chronic renal disease stage 3)  She denies recent chest pain on exertion, shortness of breath on minimal exertion, pre-syncopal episodes, or palpitations. She had not noticed any recent bleeding such as epistaxis, hematuria or hematochezia The patient denies over the counter NSAID ingestion. She takes Tylenol as needed for chronic low back pain. She is not on antiplatelets agents. Her last colonoscopy was 2010 and she states that it was normal. She is a repeat Colonoscopy in 2020 She had no prior history or diagnosis of cancer. Her age appropriate screening programs are up-to-date.  She denies any pica and eats a variety of diet. She takes wine with meals on a regular basis. She quit smoking several years ago.  She never donated blood or received blood transfusion The patient was prescribed oral iron supplements but she was told to stop 6 months ago.   MEDICAL HISTORY:  Past Medical History  Diagnosis Date  . Arthritis     SURGICAL HISTORY: No past surgical history on file.  SOCIAL HISTORY: History   Social History  .  Marital Status: Married    Spouse Name: N/A  . Number of Children: N/A  . Years of Education: N/A   Occupational History  . Not on file.   Social History Main Topics  . Smoking status: Never Smoker   . Smokeless tobacco: Never Used  . Alcohol Use: Yes  . Drug Use: No  . Sexual Activity: Not on file   Other Topics Concern  . Not on file   Social History Narrative  . No narrative on file    FAMILY HISTORY: No family history on file.  ALLERGIES:  is allergic to penicillins.  MEDICATIONS:  Current Outpatient Prescriptions  Medication Sig Dispense Refill  . acetaminophen (TYLENOL) 500 MG tablet Take 500 mg by mouth every 6 (six) hours as needed.    . B Complex-C (B-COMPLEX WITH VITAMIN C) tablet Take 1 tablet by mouth daily.    . cholecalciferol (VITAMIN D) 1000 UNITS tablet Take 1,000 Units by mouth daily.    Marland Kitchen gabapentin (NEURONTIN) 300 MG capsule Take 300 mg by mouth 2 (two) times daily.     . metoprolol succinate (TOPROL-XL) 50 MG 24 hr tablet Take 50 mg by mouth daily.     . Probiotic Product (PROBIOTIC DAILY PO) Take 1 capsule by mouth daily.    . valsartan (DIOVAN) 80 MG tablet Take 80 mg by mouth daily.     . Wheat Dextrin (BENEFIBER) TABS Take 1 tablet by mouth daily.     No current facility-administered medications for this visit.    REVIEW OF SYSTEMS:   Constitutional:  Denies fevers, chills or abnormal night sweats Eyes: Denies blurriness of vision, double vision or watery eyes Ears, nose, mouth, throat, and face: Denies mucositis or sore throat Respiratory: Denies cough, dyspnea or wheezes Cardiovascular: Denies palpitation, chest discomfort or lower extremity swelling Gastrointestinal:  Denies nausea, heartburn or change in bowel habits Skin: Denies abnormal skin rashes Lymphatics: Denies new lymphadenopath. She reports that she has easy bruising Neurological:Denies numbness, tingling or new weaknesses Behavioral/Psych: Mood is stable, no new changes  All  other systems were reviewed with the patient and are negative.  PHYSICAL EXAMINATION: ECOG PERFORMANCE STATUS: 0 - Asymptomatic  Filed Vitals:   02/04/15 1120  BP: 150/58  Pulse: 72  Temp: 98 F (36.7 C)  Resp: 17   Filed Weights   02/04/15 1119 02/04/15 1120  Weight: 140 lb (63.504 kg) 140 lb (63.504 kg)    GENERAL:alert, no distress and comfortable SKIN: skin color, texture, turgor are normal, no rashes or significant lesions EYES: normal, conjunctiva are pink and non-injected, sclera clear OROPHARYNX:no exudate, no erythema and lips, buccal mucosa, and tongue normal  NECK: supple LYMPH:  no palpable lymphadenopathy in the cervical, axillary  LUNGS: clear to auscultation and percussion with normal breathing effort HEART: regular rate & rhythm and no murmurs and no lower extremity edema ABDOMEN:abdomen soft, non-tender and normal bowel sounds Musculoskeletal:has chronic low back. no cyanosis of digits and no clubbing  PSYCH: alert & oriented x 3 with fluent speech NEURO: no focal motor/sensory deficits  LABORATORY DATA:  I have reviewed the data as listed CBC Latest Ref Rng 04/29/2011 04/27/2011  WBC 4.0 - 10.5 K/uL 7.0 9.2  Hemoglobin 12.0 - 15.0 g/dL 8.9(L) 10.1(L)  Hematocrit 36.0 - 46.0 % 27.9(L) 30.6(L)  Platelets 150 - 400 K/uL 206 230    CMP Latest Ref Rng 04/29/2011 04/28/2011 04/27/2011  Glucose 70 - 99 mg/dL 101(H) 113(H) 98  BUN 6 - 23 mg/dL 51(H) 66(H) 70(H)  Creatinine 0.4 - 1.2 mg/dL 3.90(H) 5.18(H) 5.65(H)  Sodium 135 - 145 mEq/L 142 137 135  Potassium 3.5 - 5.1 mEq/L 4.0 3.7 4.1  Chloride 96 - 112 mEq/L 112 104 102  CO2 19 - 32 mEq/L _0 Calcium 8.4 - 10.5 mg/dL 9.7 9.7 10.5     RADIOGRAPHIC STUDIES: I have personally reviewed the radiological images as listed and agreed with the findings in the report. No results found.  ASSESSMENT & PLAN:  No problem-specific assessment & plan notes found for this encounter.  Anemia and thrombocytopenia:  differential include anemia of chronic illness due to CKD and celiac disease especially given her elevated ferritin level. She states that her sister has Sjogren disease.  She will also, need to be evaluated for other causes including multiple myeloma and Myelodysplastic disorders. Due to very high ferritin level, I will evaluate her for hereditary hemochromatosis.   Plan  - anemia panel including iron level, ferritin, and folate acid, vitamin B12, homocysteine and methylmalonic acid, erythropoetin level, LDH level, and a peripheral smear. - she will ultimately require a bone marrow biopsy by IR. - I will also test for SPEP and UPEP to exclude MM - Hemachromatosis DNA PCR testing - will schedule another visit when we all all results.     All questions were answered. The patient knows to call the clinic with any problems, questions or concerns. I spent 40 minutes counseling the patient face to face. The total time spent in the appointment was 15 minutes and more than 50% was  on counseling.     Jessee Avers, MD 02/04/2015 12:24 PM

## 2015-02-04 NOTE — Telephone Encounter (Signed)
gv adn pritned appt sched and avs for pt for March....pt comming back to do lab

## 2015-02-05 LAB — TSH CHCC: TSH: 0.994 m(IU)/L (ref 0.308–3.960)

## 2015-02-05 LAB — IRON AND TIBC CHCC
%SAT: 67 % — ABNORMAL HIGH (ref 21–57)
Iron: 141 ug/dL (ref 41–142)
TIBC: 210 ug/dL — ABNORMAL LOW (ref 236–444)
UIBC: 69 ug/dL — ABNORMAL LOW (ref 120–384)

## 2015-02-05 LAB — FERRITIN CHCC: Ferritin: 1837 ng/ml — ABNORMAL HIGH (ref 9–269)

## 2015-02-08 LAB — SPEP & IFE WITH QIG
Albumin ELP: 64.5 % (ref 55.8–66.1)
Alpha-1-Globulin: 4 % (ref 2.9–4.9)
Alpha-2-Globulin: 10.3 % (ref 7.1–11.8)
Beta 2: 4.3 % (ref 3.2–6.5)
Beta Globulin: 4.9 % (ref 4.7–7.2)
Gamma Globulin: 12 % (ref 11.1–18.8)
IgA: 139 mg/dL (ref 69–380)
IgG (Immunoglobin G), Serum: 936 mg/dL (ref 690–1700)
IgM, Serum: 31 mg/dL — ABNORMAL LOW (ref 52–322)
Total Protein, Serum Electrophoresis: 7.5 g/dL (ref 6.0–8.3)

## 2015-02-08 LAB — HEAVY METALS, BLOOD
Arsenic: <3 mcg/L (ref <23)
Lead: <2 ug/dL (ref <10)
Mercury, B: <4 mcg/L (ref <=10)

## 2015-02-08 LAB — KAPPA/LAMBDA LIGHT CHAINS
Kappa free light chain: 2.58 mg/dL — ABNORMAL HIGH (ref 0.33–1.94)
Kappa:Lambda Ratio: 1.04 (ref 0.26–1.65)
Lambda Free Lght Chn: 2.48 mg/dL (ref 0.57–2.63)

## 2015-02-08 LAB — VITAMIN B12: Vitamin B-12: 647 pg/mL (ref 211–911)

## 2015-02-08 LAB — HAPTOGLOBIN: Haptoglobin: 128 mg/dL (ref 43–212)

## 2015-02-08 LAB — FOLATE RBC: RBC Folate: 1111 ng/mL (ref >280)

## 2015-02-08 LAB — SEDIMENTATION RATE: Sed Rate: 13 mm/hr (ref 0–30)

## 2015-02-08 LAB — ERYTHROPOIETIN: Erythropoietin: 15.8 m[IU]/mL (ref 2.6–18.5)

## 2015-02-08 LAB — METHYLMALONIC ACID, SERUM: Methylmalonic Acid, Quant: 177 nmol/L (ref 87–318)

## 2015-02-08 LAB — HEMOCHROMATOSIS DNA-PCR(C282Y,H63D)

## 2015-02-09 ENCOUNTER — Ambulatory Visit (HOSPITAL_COMMUNITY)
Admission: RE | Admit: 2015-02-09 | Discharge: 2015-02-09 | Disposition: A | Discharge status: Home / Self Care | Payer: Commercial Managed Care - HMO | Source: Ambulatory Visit | Attending: Hematology | Admitting: Hematology

## 2015-02-09 ENCOUNTER — Other Ambulatory Visit: Payer: Self-pay | Admitting: Radiology

## 2015-02-09 DIAGNOSIS — D649 Anemia, unspecified: Secondary | ICD-10-CM

## 2015-02-09 DIAGNOSIS — N281 Cyst of kidney, acquired: Secondary | ICD-10-CM | POA: Insufficient documentation

## 2015-02-09 DIAGNOSIS — D696 Thrombocytopenia, unspecified: Secondary | ICD-10-CM

## 2015-02-11 ENCOUNTER — Other Ambulatory Visit: Payer: Self-pay | Admitting: Radiology

## 2015-02-12 ENCOUNTER — Ambulatory Visit (HOSPITAL_COMMUNITY)
Admission: RE | Admit: 2015-02-12 | Discharge: 2015-02-12 | Disposition: A | Discharge status: Home / Self Care | Payer: Commercial Managed Care - HMO | Source: Ambulatory Visit | Attending: Hematology | Admitting: Hematology

## 2015-02-12 ENCOUNTER — Encounter (HOSPITAL_COMMUNITY): Payer: Self-pay

## 2015-02-12 DIAGNOSIS — D61818 Other pancytopenia: Secondary | ICD-10-CM | POA: Diagnosis not present

## 2015-02-12 DIAGNOSIS — D649 Anemia, unspecified: Secondary | ICD-10-CM | POA: Insufficient documentation

## 2015-02-12 DIAGNOSIS — D696 Thrombocytopenia, unspecified: Secondary | ICD-10-CM

## 2015-02-12 LAB — UIFE/LIGHT CHAINS/TP QN, 24-HR UR
Albumin, U: DETECTED
Alpha 1, Urine: DETECTED — AB
Alpha 2, Urine: DETECTED — AB
Beta, Urine: DETECTED — AB
Gamma Globulin, Urine: DETECTED — AB
Time: 24 hours
Total Protein, Urine: <4 mg/dL — ABNORMAL LOW (ref 5–24)
Volume, Urine: 2000 mL

## 2015-02-12 LAB — CBC
HCT: 33.6 % — ABNORMAL LOW (ref 36.0–46.0)
Hemoglobin: 10.8 g/dL — ABNORMAL LOW (ref 12.0–15.0)
MCH: 34.2 pg — ABNORMAL HIGH (ref 26.0–34.0)
MCHC: 32.1 g/dL (ref 30.0–36.0)
MCV: 106.3 fL — ABNORMAL HIGH (ref 78.0–100.0)
Platelets: 107 10*3/uL — ABNORMAL LOW (ref 150–400)
RBC: 3.16 MIL/uL — ABNORMAL LOW (ref 3.87–5.11)
RDW: 14 % (ref 11.5–15.5)
WBC: 6.9 10*3/uL (ref 4.0–10.5)

## 2015-02-12 LAB — APTT: aPTT: 25 seconds (ref 24–37)

## 2015-02-12 LAB — PROTIME-INR
INR: 1.08 (ref 0.00–1.49)
Prothrombin Time: 14.1 seconds (ref 11.6–15.2)

## 2015-02-12 LAB — 24 HR URINE,KAPPA/LAMBDA LIGHT CHAINS
24H Urine Volume: 2000 mL/24 h
Measured Kappa Chain: <0.4 mg/dL (ref <2.00)
Measured Lambda Chain: <0.4 mg/dL (ref <2.00)

## 2015-02-12 LAB — BONE MARROW EXAM

## 2015-02-12 MED ORDER — HYDROCODONE-ACETAMINOPHEN 5-325 MG PO TABS
1.0000 | ORAL_TABLET | ORAL | Status: DC | PRN
  Filled 2015-02-12: qty 2

## 2015-02-12 MED ORDER — FENTANYL CITRATE 0.05 MG/ML IJ SOLN
INTRAMUSCULAR | Status: AC | PRN
  Administered 2015-02-12: 25 ug via INTRAVENOUS
  Administered 2015-02-12: 50 ug via INTRAVENOUS
  Administered 2015-02-12: 25 ug via INTRAVENOUS

## 2015-02-12 MED ORDER — MIDAZOLAM HCL 2 MG/2ML IJ SOLN
INTRAMUSCULAR | Status: AC | PRN
Start: 2015-02-12 — End: 2015-02-12
  Administered 2015-02-12 (×2): 0.5 mg via INTRAVENOUS
  Administered 2015-02-12: 1 mg via INTRAVENOUS

## 2015-02-12 MED ORDER — FENTANYL CITRATE 0.05 MG/ML IJ SOLN
INTRAMUSCULAR | Status: AC
  Filled 2015-02-12: qty 4

## 2015-02-12 MED ORDER — SODIUM CHLORIDE 0.9 % IV SOLN
INTRAVENOUS | Status: DC
  Administered 2015-02-12: 09:00:00 via INTRAVENOUS

## 2015-02-12 MED ORDER — MIDAZOLAM HCL 2 MG/2ML IJ SOLN
INTRAMUSCULAR | Status: AC
  Filled 2015-02-12: qty 6

## 2015-02-12 NOTE — H&P (Signed)
Chief Complaint: Anemia; thrombocytopenia  Referring Physician(s): Feng,Yan  History of Present Illness: [name] is a 69 y.o. female   Worsening anemia and thrombocytopenia Request for bone marrow bx per Dr Burr Medico Known CKD  Past Medical History  Diagnosis Date  . Arthritis     History reviewed. No pertinent past surgical history.  Allergies: Penicillins  Medications: Prior to Admission medications   Medication Sig Start Date End Date Taking? Authorizing Provider  B Complex-C (B-COMPLEX WITH VITAMIN C) tablet Take 1 tablet by mouth daily.   Yes Historical Provider, MD  cholecalciferol (VITAMIN D) 1000 UNITS tablet Take 1,000 Units by mouth daily.   Yes Historical Provider, MD  gabapentin (NEURONTIN) 300 MG capsule Take 300 mg by mouth 2 (two) times daily.  07/21/14  Yes Historical Provider, MD  metoprolol succinate (TOPROL-XL) 50 MG 24 hr tablet Take 50 mg by mouth every morning.  07/21/14  Yes Historical Provider, MD  Probiotic Product (PROBIOTIC DAILY PO) Take 1 capsule by mouth daily.   Yes Historical Provider, MD  valsartan (DIOVAN) 80 MG tablet Take 80 mg by mouth every morning.  07/22/14  Yes Historical Provider, MD  Wheat Dextrin (BENEFIBER) TABS Take 1 tablet by mouth daily.   Yes Historical Provider, MD  acetaminophen (TYLENOL) 500 MG tablet Take 500 mg by mouth every 6 (six) hours as needed for mild pain.     Historical Provider, MD     History reviewed. No pertinent family history.  History   Social History  . Marital Status: Married    Spouse Name: N/A  . Number of Children: N/A  . Years of Education: N/A   Social History Main Topics  . Smoking status: Former Research scientist (life sciences)  . Smokeless tobacco: Never Used  . Alcohol Use: Yes  . Drug Use: No  . Sexual Activity: Not on file   Other Topics Concern  . None   Social History Narrative     Review of Systems: A 12 point ROS discussed and pertinent positives are indicated in the HPI above.  All other  systems are negative.  Review of Systems  Constitutional: Negative for activity change, appetite change and fatigue.  Respiratory: Negative for cough, chest tightness and shortness of breath.   Gastrointestinal: Negative for abdominal pain.  Skin: Negative for color change.  Neurological: Negative for weakness.  Psychiatric/Behavioral: Negative for behavioral problems and confusion.    Vital Signs: BP 136/56; P 78; R 20; T 97.6  Physical Exam  Constitutional: She is oriented to person, place, and time. She appears well-developed and well-nourished.  Cardiovascular: Normal rate and regular rhythm.  Exam reveals no friction rub.   No murmur heard. Pulmonary/Chest: Effort normal and breath sounds normal. She has no wheezes.  Abdominal: Soft. Bowel sounds are normal. There is no tenderness.  Musculoskeletal: Normal range of motion.  Neurological: She is alert and oriented to person, place, and time.  Skin: Skin is warm and dry.  Psychiatric: She has a normal mood and affect. Her behavior is normal. Judgment and thought content normal.  Nursing note and vitals reviewed.   Mallampati Score:  MD Evaluation Airway: WNL Heart: WNL Abdomen: WNL Chest/ Lungs: WNL ASA  Classification: 2 Mallampati/Airway Score: One  Imaging: US Abdomen Complete  02/09/2015   CLINICAL DATA:  Thrombocytopenia.  EXAM: ULTRASOUND ABDOMEN COMPLETE  COMPARISON:  None.  FINDINGS: Gallbladder: No gallstones or wall thickening visualized. No sonographic Murphy sign noted.  Common bile duct: Diameter: 4.9 mm  Liver:  0.6 x 0.5 x 0.7 cm echogenic tiny nodule noted in the liver consistent with tiny hemangioma. Within normal limits in parenchymal echogenicity.  IVC: No abnormality visualized.  Pancreas: Visualized portion unremarkable.  Spleen: Size and appearance within normal limits.  Right Kidney: Length: 9.3 cm. Mild renal cortical thinning. Echogenicity within normal limits. No significant mass or hydronephrosis  visualized. 2.7 x 2.5 x 2.5 cm simple cyst inferior portion left kidney.  Left Kidney: Length: 10.3 cm. Mild renal cortical thinning. Echogenicity within normal limits. No mass or hydronephrosis visualized.  Abdominal aorta: No aneurysm visualized.  Other findings: None.  IMPRESSION: 1. Mild renal cortical thinning.  Simple cyst left kidney. 2. No acute abnormality.   Electronically Signed   By: Marcello Moores  Register   On: 02/09/2015 10:35    Labs:  CBC:  Recent Labs  02/04/15 1509 02/12/15 0857  WBC 9.1 6.9  HGB 11.2* 10.8*  HCT 34.4* 33.6*  PLT 122* PENDING    COAGS:  Recent Labs  02/04/15 1533 02/12/15 0857  INR 1.08 1.08  APTT  --  25    BMP:  Recent Labs  02/04/15 1512  NA 143  K 3.9  CO2 24  GLUCOSE 137  BUN 19.3  CALCIUM 10.1  CREATININE 1.2*    LIVER FUNCTION TESTS:  Recent Labs  02/04/15 1512  BILITOT 0.54  AST 27  ALT 19  ALKPHOS 62  PROT 7.3  ALBUMIN 4.4    TUMOR MARKERS: No results for input(s): AFPTM, CEA, CA199, CHROMGRNA in the last 8760 hours.  Assessment and Plan:  Anemia;thrombocytopenia Now scheduled for BM bx Pt aware of procedure benefits and risks including but not limited to: Infection; bleeding; bone damage; damage to surrounding structures Agreeable to proceed Consent signed andin chart  Thank you for this interesting consult.  I greatly enjoyed meeting [name] and look forward to participating in their care.  Signed: Martavius Lusty A 02/12/2015, 10:05 AM   I spent a total of  20 Minutes   in face to face in clinical consultation, greater than 50% of which was counseling/coordinating care for BM bx

## 2015-02-12 NOTE — Procedures (Signed)
CT-guided  R iliac bone marrow aspiration and core biopsy No complication No blood loss. See complete dictation in [facility]

## 2015-02-12 NOTE — Discharge Instructions (Signed)
Leave dressing on for 24 hours.  You may shower after 24 hours.  Please remove the dressing before you shower.   ° °Bone Marrow Aspiration, Bone Marrow Biopsy °Care After °Read the instructions outlined below and refer to this sheet in the next few weeks. These discharge instructions provide you with general information on caring for yourself after you leave the hospital. Your caregiver may also give you specific instructions. While your treatment has been planned according to the most current medical practices available, unavoidable complications occasionally occur. If you have any problems or questions after discharge, call your caregiver. °FINDING OUT THE RESULTS OF YOUR TEST °Not all test results are available during your visit. If your test results are not back during the visit, make an appointment with your caregiver to find out the results. Do not assume everything is normal if you have not heard from your caregiver or the medical facility. It is important for you to follow up on all of your test results.  °HOME CARE INSTRUCTIONS  °You have had sedation and may be sleepy or dizzy. Your thinking may not be as clear as usual. For the next 24 hours: °· Only take over-the-counter or prescription medicines for pain, discomfort, and or fever as directed by your caregiver. °· Do not drink alcohol. °· Do not smoke. °· Do not drive. °· Do not make important legal decisions. °· Do not operate heavy machinery. °· Do not care for small children by yourself. °· Keep your dressing clean and dry. You may replace dressing with a bandage after 24 hours. °· You may take a bath or shower after 24 hours. °· Use an ice pack for 20 minutes every 2 hours while awake for pain as needed. °SEEK MEDICAL CARE IF:  °· There is redness, swelling, or increasing pain at the biopsy site. °· There is pus coming from the biopsy site. °· There is drainage from a biopsy site lasting longer than one day. °· An unexplained oral temperature above  102° F (38.9° C) develops. °SEEK IMMEDIATE MEDICAL CARE IF:  °· You develop a rash. °· You have difficulty breathing. °· You develop any reaction or side effects to medications given. °Document Released: 06/30/2005 Document Revised: 03/04/2012 Document Reviewed: 12/08/2008 °[employer]® Patient Information ©2015 [employer]. This information is not intended to replace advice given to you by your health care provider. Make sure you discuss any questions you have with your health care provider. °Conscious Sedation, Adult, Care After °Refer to this sheet in the next few weeks. These instructions provide you with information on caring for yourself after your procedure. Your health care provider may also give you more specific instructions. Your treatment has been planned according to current medical practices, but problems sometimes occur. Call your health care provider if you have any problems or questions after your procedure. °WHAT TO EXPECT AFTER THE PROCEDURE  °After your procedure: °· You may feel sleepy, clumsy, and have poor balance for several hours. °· Vomiting may occur if you eat too soon after the procedure. °HOME CARE INSTRUCTIONS °· Do not participate in any activities where you could become injured for at least 24 hours. Do not: °· Drive. °· Swim. °· Ride a bicycle. °· Operate heavy machinery. °· Cook. °· Use power tools. °· Climb ladders. °· Work from a high place. °· Do not make important decisions or sign legal documents until you are improved. °· If you vomit, drink water, juice, or soup when you can drink without vomiting.   Make sure you have little or no nausea before eating solid foods.  Only take over-the-counter or prescription medicines for pain, discomfort, or fever as directed by your health care provider.  Make sure you and your family fully understand everything about the medicines given to you, including what side effects may occur.  You should not drink alcohol, take sleeping pills,  or take medicines that cause drowsiness for at least 24 hours.  If you smoke, do not smoke without supervision.  If you are feeling better, you may resume normal activities 24 hours after you were sedated.  Keep all appointments with your health care provider. SEEK MEDICAL CARE IF:  Your skin is pale or bluish in color.  You continue to feel nauseous or vomit.  Your pain is getting worse and is not helped by medicine.  You have bleeding or swelling.  You are still sleepy or feeling clumsy after 24 hours. SEEK IMMEDIATE MEDICAL CARE IF:  You develop a rash.  You have difficulty breathing.  You develop any type of allergic problem.  You have a fever. MAKE SURE YOU:  Understand these instructions.  Will watch your condition.  Will get help right away if you are not doing well or get worse. Document Released: 10/01/2013 Document Reviewed: 10/01/2013 [facility] Patient Information 2015 [place], [place]. This information is not intended to replace advice given to you by your health care provider. Make sure you discuss any questions you have with your health care provider.

## 2015-02-18 ENCOUNTER — Other Ambulatory Visit: Payer: Self-pay | Admitting: Hematology

## 2015-02-18 ENCOUNTER — Encounter: Payer: Self-pay | Admitting: Hematology

## 2015-02-18 NOTE — Telephone Encounter (Signed)
Called pt & informed to try heat off & on to site.  She reports no swelling, redness, or drainage or fever.  She was informed to call back if symptoms worse or no better.  She was just concerned b/c she was told that the soreness would only last a couple of days.  She expresses understanding.

## 2015-02-19 ENCOUNTER — Encounter: Payer: Self-pay | Admitting: Hematology

## 2015-02-23 LAB — CHROMOSOME ANALYSIS, BONE MARROW

## 2015-03-05 ENCOUNTER — Telehealth: Payer: Self-pay | Admitting: Hematology

## 2015-03-05 ENCOUNTER — Telehealth: Payer: Self-pay | Admitting: Hematology and Oncology

## 2015-03-05 ENCOUNTER — Ambulatory Visit (HOSPITAL_BASED_OUTPATIENT_CLINIC_OR_DEPARTMENT_OTHER): Payer: Commercial Managed Care - HMO | Admitting: Hematology

## 2015-03-05 VITALS — BP 140/55 | HR 96 | Temp 98.2°F | Resp 18 | Ht 62.0 in | Wt 141.3 lb

## 2015-03-05 DIAGNOSIS — D649 Anemia, unspecified: Secondary | ICD-10-CM

## 2015-03-05 DIAGNOSIS — D696 Thrombocytopenia, unspecified: Secondary | ICD-10-CM

## 2015-03-05 NOTE — Telephone Encounter (Signed)
appts made and avs printed for pt,contrast given to pt  [name]

## 2015-03-05 NOTE — Progress Notes (Signed)
[place]  [phone] Fax:(336) [phone]  Clinic Follow up Note   Patient Care Team: [name], MD as PCP - General (Family Medicine) 03/11/2015  Diagnosis: Anemia  INTERVAL HISTORY: [name] returns for follow-up. She is clinically stable, no significant change since I saw her 4 months ago. She has mild fatigue, no pain or other complaints. She denied any bleeding episodes including hematochezia, melana, hemoptysis, hematuria or epitaxis. No mucosal bleeding or easy bruising.    REVIEW OF SYSTEMS:   Constitutional: Denies fevers, chills or abnormal weight loss, (+) fatigue Eyes: Denies blurriness of vision Ears, nose, mouth, throat, and face: Denies mucositis or sore throat Respiratory: Denies cough, dyspnea or wheezes Cardiovascular: Denies palpitation, chest discomfort or lower extremity swelling Gastrointestinal:  Denies nausea, heartburn or change in bowel habits Skin: Denies abnormal skin rashes Lymphatics: Denies new lymphadenopathy or easy bruising Neurological:Denies numbness, tingling or new weaknesses Behavioral/Psych: Mood is stable, no new changes  All other systems were reviewed with the patient and are negative.  MEDICAL HISTORY:  Past Medical History  Diagnosis Date  . Arthritis     SURGICAL HISTORY: History reviewed. No pertinent past surgical history.  I have reviewed the social history and family history with the patient and they are unchanged from previous note.  ALLERGIES:  is allergic to penicillins.  MEDICATIONS:  Current Outpatient Prescriptions  Medication Sig Dispense Refill  . acetaminophen (TYLENOL) 500 MG tablet Take 500 mg by mouth every 6 (six) hours as needed for mild pain.     . B Complex-C (B-COMPLEX WITH VITAMIN C) tablet Take 1 tablet by mouth daily.    . cholecalciferol (VITAMIN D) 1000 UNITS tablet Take 1,000 Units by mouth daily.    Marland Kitchen gabapentin (NEURONTIN) 300 MG capsule Take 300 mg by mouth 2  (two) times daily.     . metoprolol succinate (TOPROL-XL) 50 MG 24 hr tablet Take 50 mg by mouth every morning.     . Probiotic Product (PROBIOTIC DAILY PO) Take 1 capsule by mouth daily.    . valsartan (DIOVAN) 80 MG tablet Take 80 mg by mouth every morning.     . Wheat Dextrin (BENEFIBER) TABS Take 1 tablet by mouth daily.     No current facility-administered medications for this visit.    PHYSICAL EXAMINATION: ECOG PERFORMANCE STATUS: 1 - Symptomatic but completely ambulatory  Filed Vitals:   03/05/15 1337  BP: 140/55  Pulse: 96  Temp: 98.2 F (36.8 C)  Resp: 18   Filed Weights   03/05/15 1337  Weight: 141 lb 4.8 oz (64.093 kg)    GENERAL:alert, no distress and comfortable SKIN: skin color, texture, turgor are normal, no rashes or significant lesions EYES: normal, Conjunctiva are pink and non-injected, sclera clear OROPHARYNX:no exudate, no erythema and lips, buccal mucosa, and tongue normal  NECK: supple, thyroid normal size, non-tender, without nodularity LYMPH:  no palpable lymphadenopathy in the cervical, axillary or inguinal LUNGS: clear to auscultation and percussion with normal breathing effort HEART: regular rate & rhythm and no murmurs and no lower extremity edema ABDOMEN:abdomen soft, non-tender and normal bowel sounds Musculoskeletal:no cyanosis of digits and no clubbing  NEURO: alert & oriented x 3 with fluent speech, no focal motor/sensory deficits  LABORATORY DATA:  I have reviewed the data as listed CBC Latest Ref Rng 02/12/2015 02/04/2015 04/29/2011  WBC 4.0 - 10.5 K/uL 6.9 9.1 7.0  Hemoglobin 12.0 - 15.0 g/dL 10.8(L) 11.2(L) 8.9(L)  Hematocrit 36.0 - 46.0 % 33.6(L) 34.4(L) 27.9(L)  Platelets 150 - 400 K/uL 107(L) 122(L) 206     CMP Latest Ref Rng 02/04/2015 04/29/2011 04/28/2011  Glucose 70 - 140 mg/dl 137 101(H) 113(H)  BUN 7.0 - 26.0 mg/dL 19.3 51(H) 66(H)  Creatinine 0.6 - 1.1 mg/dL 1.2(H) 3.90(H) 5.18(H)  Sodium 136 - 145 mEq/L 143 142 137  Potassium  3.5 - 5.1 mEq/L 3.9 4.0 3.7  Chloride 96 - 112 mEq/L - 112 104  CO2 22 - 29 mEq/L _0 Calcium 8.4 - 10.4 mg/dL 10.1 9.7 9.7  Total Protein 6.4 - 8.3 g/dL 7.3 - -  Total Bilirubin 0.20 - 1.20 mg/dL 0.54 - -  Alkaline Phos 40 - 150 U/L 62 - -  AST 5 - 34 U/L 27 - -  ALT 0 - 55 U/L 19 - -   PATHOLOGY REPORT Bone Marrow, Aspirate,Biopsy, and Clot, right iliac 02/12/2015 - HYPERCELLULAR BONE MARROW FOR AGE WITH TRILINEAGE HEMATOPOIESIS. - SEVERAL SMALL LYMPHOID AGGREGATES ASSOCIATED WITH A MINOR POPULATION OF ABNORMAL B-CELLS . - SEE COMMENT. PERIPHERAL BLOOD: - MACROCYTIC ANEMIA. - THROMBOCYTOPENIA. Diagnosis Note The aspirate material is limited for accurate cytomorphologic evaluation. Nonetheless, scanty hematopoietic elements along with touch imprints show a mixture of myeloid cell types with progressive maturation and lack of significant dyspoiesis. In addition, there are several variably sized but predominantly small lymphoid aggregates mostly composed of small lymphocytes as represented on limited levels of sectioning. Flow cytometric analysis of the lymphoid population shows a small abnormal B cell population estimated at 9% of all cells with expression of pan B cell antigens including CD20 and CD23 with associated co-expression of CD5. Immunohistochemical stains were performed but the number and size of lymphoid aggregates in the clot and biopsy sections is further reduced on deeper sectioning. Nonetheless there is predominance of B-cells with possible weak co-expression of CD5. The limited morphologic and phenotypic lymphoid changes are suspicious for early involvement by a low grade B cell lymphoproliferative process, particularly small lymphocytic lymphoma/chronic lymphocytic leukemia. Correlation with cytogenetic studies is recommended. (BNS:kh 02-16-15)  Cytogenetics: normal     RADIOGRAPHIC STUDIES: I have personally reviewed the radiological images as listed and  agreed with the findings in the report. No results found.   ASSESSMENT & PLAN: 69 year old female with past medical history of arthritis, presented with anemia and mild thrombocytopenia  1. 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 SPEP and UPEP with immunofixation was negative. No lab evidence of hemolysis, erythropoietin level is normal. Sedimentation rate was normal, no evidence of chronic inflammation or anemia of chronic disease. -I reviewed her bone marrow biopsy results. There is several small lymphoid aggregation, suspicious for low-grade B cell lymphoproliferative process, especially SLL. Her peripheral white count is normal, no elevated lymphocytes.  -I recommend to obtain a CT of chest abdomen and pelvis to ruled out lymphadenopathy. If there is prominent lymphadenopathy, I would recommend biopsy if feasible. -If her CT scan is negative, I'll follow her clinically, and consider repeat a bone marrow biopsy if her blood counts get worse.  Plan -CT chest, abdomen and pelvis without contrast due to her history of renal failure -Return to clinic for follow-up In 3 weeks.   Orders Placed This Encounter  Procedures  . CT Abdomen Pelvis Wo Contrast    Standing Status: Future     Number of Occurrences:      Standing Expiration Date: 03/04/2016    Order Specific  Question:  Reason for Exam (SYMPTOM  OR DIAGNOSIS REQUIRED)    Answer:  rule out lymphoma    Order Specific Question:  Preferred imaging location?    Answer:  [facility]  . CT Chest Wo Contrast    Standing Status: Future     Number of Occurrences:      Standing Expiration Date: 03/04/2016    Order Specific Question:  Reason for Exam (SYMPTOM  OR DIAGNOSIS REQUIRED)    Answer:  rule out lymphoma    Order Specific Question:  Preferred imaging location?    Answer:  [facility]   All questions were  answered. The patient knows to call the clinic with any problems, questions or concerns. No barriers to learning was detected. I spent 20 minutes counseling the patient face to face. The total time spent in the appointment was 25 minutes and more than 50% was on counseling and review of test results     Truitt Merle, MD 03/05/2015 7:36 PM

## 2015-03-09 ENCOUNTER — Encounter: Payer: Self-pay | Admitting: Hematology

## 2015-03-11 ENCOUNTER — Encounter: Payer: Self-pay | Admitting: Hematology

## 2015-03-18 ENCOUNTER — Encounter: Payer: Self-pay | Admitting: Hematology

## 2015-03-19 ENCOUNTER — Other Ambulatory Visit: Payer: Self-pay | Admitting: Hematology

## 2015-03-19 NOTE — Telephone Encounter (Signed)
I called her back. She has some generalized weakness and muscle achiness in the morning, improves in the afternoon. No other new symptoms. She is not overly concerned. She knows to call if her symptoms gets worse. She is scheduled to have a CT scan on April 8 and see me back on April 11.  [name]  03/19/2015

## 2015-04-02 ENCOUNTER — Encounter (HOSPITAL_COMMUNITY): Payer: Self-pay

## 2015-04-02 ENCOUNTER — Ambulatory Visit (HOSPITAL_COMMUNITY)
Admission: RE | Admit: 2015-04-02 | Discharge: 2015-04-02 | Disposition: A | Discharge status: Home / Self Care | Payer: Commercial Managed Care - HMO | Source: Ambulatory Visit | Attending: Hematology | Admitting: Hematology

## 2015-04-02 DIAGNOSIS — E041 Nontoxic single thyroid nodule: Secondary | ICD-10-CM | POA: Diagnosis not present

## 2015-04-02 DIAGNOSIS — I700 Atherosclerosis of aorta: Secondary | ICD-10-CM | POA: Diagnosis not present

## 2015-04-02 DIAGNOSIS — R911 Solitary pulmonary nodule: Secondary | ICD-10-CM | POA: Diagnosis not present

## 2015-04-02 DIAGNOSIS — D649 Anemia, unspecified: Secondary | ICD-10-CM

## 2015-04-02 DIAGNOSIS — D696 Thrombocytopenia, unspecified: Secondary | ICD-10-CM

## 2015-04-02 DIAGNOSIS — C8590 Non-Hodgkin lymphoma, unspecified, unspecified site: Secondary | ICD-10-CM | POA: Diagnosis present

## 2015-04-02 HISTORY — DX: Essential (primary) hypertension: I10

## 2015-04-05 ENCOUNTER — Encounter: Payer: Self-pay | Admitting: Hematology

## 2015-04-05 ENCOUNTER — Ambulatory Visit (HOSPITAL_BASED_OUTPATIENT_CLINIC_OR_DEPARTMENT_OTHER): Payer: Commercial Managed Care - HMO | Admitting: Hematology

## 2015-04-05 ENCOUNTER — Other Ambulatory Visit: Payer: Commercial Managed Care - HMO

## 2015-04-05 ENCOUNTER — Telehealth: Payer: Self-pay | Admitting: Hematology

## 2015-04-05 ENCOUNTER — Other Ambulatory Visit (HOSPITAL_BASED_OUTPATIENT_CLINIC_OR_DEPARTMENT_OTHER): Payer: Commercial Managed Care - HMO

## 2015-04-05 ENCOUNTER — Ambulatory Visit (HOSPITAL_COMMUNITY): Payer: Commercial Managed Care - HMO

## 2015-04-05 VITALS — BP 140/64 | HR 85 | Temp 97.9°F | Resp 18 | Ht 62.0 in | Wt 137.0 lb

## 2015-04-05 DIAGNOSIS — D759 Disease of blood and blood-forming organs, unspecified: Secondary | ICD-10-CM

## 2015-04-05 DIAGNOSIS — M069 Rheumatoid arthritis, unspecified: Secondary | ICD-10-CM | POA: Diagnosis not present

## 2015-04-05 DIAGNOSIS — D649 Anemia, unspecified: Secondary | ICD-10-CM

## 2015-04-05 DIAGNOSIS — D696 Thrombocytopenia, unspecified: Secondary | ICD-10-CM

## 2015-04-05 LAB — COMPREHENSIVE METABOLIC PANEL (CC13)
ALT: 17 U/L (ref 0–55)
AST: 20 U/L (ref 5–34)
Albumin: 3.6 g/dL (ref 3.5–5.0)
Alkaline Phosphatase: 68 U/L (ref 40–150)
Anion Gap: 11 mEq/L (ref 3–11)
BUN: 22.7 mg/dL (ref 7.0–26.0)
CO2: 24 mEq/L (ref 22–29)
Calcium: 9.9 mg/dL (ref 8.4–10.4)
Chloride: 105 mEq/L (ref 98–109)
Creatinine: 1 mg/dL (ref 0.6–1.1)
EGFR: 61 mL/min/{1.73_m2} — ABNORMAL LOW (ref >90)
Glucose: 98 mg/dl (ref 70–140)
Potassium: 4.6 mEq/L (ref 3.5–5.1)
Sodium: 140 mEq/L (ref 136–145)
Total Bilirubin: 0.25 mg/dL (ref 0.20–1.20)
Total Protein: 7.3 g/dL (ref 6.4–8.3)

## 2015-04-05 LAB — CBC & DIFF AND RETIC
BASO%: 0.1 % (ref 0.0–2.0)
Basophils Absolute: 0 10*3/uL (ref 0.0–0.1)
EOS%: 0.4 % (ref 0.0–7.0)
Eosinophils Absolute: 0.1 10*3/uL (ref 0.0–0.5)
HCT: 32.9 % — ABNORMAL LOW (ref 34.8–46.6)
HGB: 10.6 g/dL — ABNORMAL LOW (ref 11.6–15.9)
Immature Retic Fract: 11.9 % — ABNORMAL HIGH (ref 1.60–10.00)
LYMPH%: 18 % (ref 14.0–49.7)
MCH: 33.1 pg (ref 25.1–34.0)
MCHC: 32.2 g/dL (ref 31.5–36.0)
MCV: 102.8 fL — ABNORMAL HIGH (ref 79.5–101.0)
MONO#: 1.2 10*3/uL — ABNORMAL HIGH (ref 0.1–0.9)
MONO%: 8.2 % (ref 0.0–14.0)
NEUT#: 10.6 10*3/uL — ABNORMAL HIGH (ref 1.5–6.5)
NEUT%: 73.3 % (ref 38.4–76.8)
Platelets: 235 10*3/uL (ref 145–400)
RBC: 3.2 10*6/uL — ABNORMAL LOW (ref 3.70–5.45)
RDW: 12.9 % (ref 11.2–14.5)
Retic %: 2.04 % (ref 0.70–2.10)
Retic Ct Abs: 65.28 10*3/uL (ref 33.70–90.70)
WBC: 14.4 10*3/uL — ABNORMAL HIGH (ref 3.9–10.3)
lymph#: 2.6 10*3/uL (ref 0.9–3.3)
nRBC: 0 % (ref 0–0)

## 2015-04-05 NOTE — Progress Notes (Signed)
[place]  Telephone:(336) [phone] Fax:(336) [phone]  Clinic Follow up Note   Patient Care Team: [name], MD as PCP - General (Family Medicine) 04/05/2015  Diagnosis: Anemia  INTERVAL HISTORY: [name] returns for follow-up. She recently had muscle achiness and  was treated with flexiril which did not help much, and started prednisone 2 days ago, prednisone helped with her left shoulder pain tremendously, and she is able to move it without limitation now.  No other new complains. She denies any fever chill, night sweats or recent weight loss.  REVIEW OF SYSTEMS:   Constitutional: Denies fevers, chills or abnormal weight loss, (+) fatigue Eyes: Denies blurriness of vision Ears, nose, mouth, throat, and face: Denies mucositis or sore throat Respiratory: Denies cough, dyspnea or wheezes Cardiovascular: Denies palpitation, chest discomfort or lower extremity swelling Gastrointestinal:  Denies nausea, heartburn or change in bowel habits Skin: Denies abnormal skin rashes Lymphatics: Denies new lymphadenopathy or easy bruising Neurological:Denies numbness, tingling or new weaknesses Behavioral/Psych: Mood is stable, no new changes  All other systems were reviewed with the patient and are negative.  MEDICAL HISTORY:  Past Medical History  Diagnosis Date  . Arthritis   . Hypertension     SURGICAL HISTORY: No past surgical history on file.  I have reviewed the social history and family history with the patient and they are unchanged from previous note.  ALLERGIES:  is allergic to penicillins.  MEDICATIONS:  Current Outpatient Prescriptions  Medication Sig Dispense Refill  . acetaminophen (TYLENOL) 500 MG tablet Take 500 mg by mouth every 6 (six) hours as needed for mild pain.     . B Complex-C (B-COMPLEX WITH VITAMIN C) tablet Take 1 tablet by mouth daily.    . cholecalciferol (VITAMIN D) 1000 UNITS tablet Take 1,000 Units by mouth daily.    Marland Kitchen gabapentin  (NEURONTIN) 300 MG capsule Take 300 mg by mouth 2 (two) times daily.     . metoprolol succinate (TOPROL-XL) 50 MG 24 hr tablet Take 50 mg by mouth every morning.     . Probiotic Product (PROBIOTIC DAILY PO) Take 1 capsule by mouth daily.    . valsartan (DIOVAN) 80 MG tablet Take 80 mg by mouth every morning.     . Wheat Dextrin (BENEFIBER) TABS Take 1 tablet by mouth daily.     No current facility-administered medications for this visit.    PHYSICAL EXAMINATION: ECOG PERFORMANCE STATUS: 1 - Symptomatic but completely ambulatory  Filed Vitals:   04/05/15 1016  BP: 140/64  Pulse: 85  Temp: 97.9 F (36.6 C)  Resp: 18   Filed Weights   04/05/15 1016  Weight: 137 lb (62.143 kg)    GENERAL:alert, no distress and comfortable SKIN: skin color, texture, turgor are normal, no rashes or significant lesions EYES: normal, Conjunctiva are pink and non-injected, sclera clear OROPHARYNX:no exudate, no erythema and lips, buccal mucosa, and tongue normal  NECK: supple, thyroid normal size, non-tender, without nodularity LYMPH:  no palpable lymphadenopathy in the cervical, axillary or inguinal LUNGS: clear to auscultation and percussion with normal breathing effort HEART: regular rate & rhythm and no murmurs and no lower extremity edema ABDOMEN:abdomen soft, non-tender and normal bowel sounds Musculoskeletal:no cyanosis of digits and no clubbing  NEURO: alert & oriented x 3 with fluent speech, no focal motor/sensory deficits  LABORATORY DATA:  I have reviewed the data as listed CBC Latest Ref Rng 02/12/2015 02/04/2015 04/29/2011  WBC 4.0 - 10.5 K/uL 6.9 9.1 7.0  Hemoglobin 12.0 -  15.0 g/dL 10.8(L) 11.2(L) 8.9(L)  Hematocrit 36.0 - 46.0 % 33.6(L) 34.4(L) 27.9(L)  Platelets 150 - 400 K/uL 107(L) 122(L) 206     CMP Latest Ref Rng 02/04/2015 04/29/2011 04/28/2011  Glucose 70 - 140 mg/dl 137 101(H) 113(H)  BUN 7.0 - 26.0 mg/dL 19.3 51(H) 66(H)  Creatinine 0.6 - 1.1 mg/dL 1.2(H) 3.90(H) 5.18(H)    Sodium 136 - 145 mEq/L 143 142 137  Potassium 3.5 - 5.1 mEq/L 3.9 4.0 3.7  Chloride 96 - 112 mEq/L - 112 104  CO2 22 - 29 mEq/L _0 Calcium 8.4 - 10.4 mg/dL 10.1 9.7 9.7  Total Protein 6.4 - 8.3 g/dL 7.3 - -  Total Bilirubin 0.20 - 1.20 mg/dL 0.54 - -  Alkaline Phos 40 - 150 U/L 62 - -  AST 5 - 34 U/L 27 - -  ALT 0 - 55 U/L 19 - -   PATHOLOGY REPORT Bone Marrow, Aspirate,Biopsy, and Clot, right iliac 02/12/2015 - HYPERCELLULAR BONE MARROW FOR AGE WITH TRILINEAGE HEMATOPOIESIS. - SEVERAL SMALL LYMPHOID AGGREGATES ASSOCIATED WITH A MINOR POPULATION OF ABNORMAL B-CELLS . - SEE COMMENT. PERIPHERAL BLOOD: - MACROCYTIC ANEMIA. - THROMBOCYTOPENIA. Diagnosis Note The aspirate material is limited for accurate cytomorphologic evaluation. Nonetheless, scanty hematopoietic elements along with touch imprints show a mixture of myeloid cell types with progressive maturation and lack of significant dyspoiesis. In addition, there are several variably sized but predominantly small lymphoid aggregates mostly composed of small lymphocytes as represented on limited levels of sectioning. Flow cytometric analysis of the lymphoid population shows a small abnormal B cell population estimated at 9% of all cells with expression of pan B cell antigens including CD20 and CD23 with associated co-expression of CD5. Immunohistochemical stains were performed but the number and size of lymphoid aggregates in the clot and biopsy sections is further reduced on deeper sectioning. Nonetheless there is predominance of B-cells with possible weak co-expression of CD5. The limited morphologic and phenotypic lymphoid changes are suspicious for early involvement by a low grade B cell lymphoproliferative process, particularly small lymphocytic lymphoma/chronic lymphocytic leukemia. Correlation with cytogenetic studies is recommended. (BNS:kh 02-16-15)  Cytogenetics: normal     RADIOGRAPHIC STUDIES: I have personally  reviewed the radiological images as listed and agreed with the findings in the report.  CT chest. Abdomen and pelvis 04/02/2015  IMPRESSION: 1. No evidence for mass or adenopathy within the chest abdomen or pelvis. 2. Negative for splenomegaly. 3. 4 mm anterior right upper lobe nodule is identified. If the patient is at high risk for bronchogenic carcinoma, follow-up chest CT at 1year is recommended. If the patient is at low risk, no follow-up is needed. This recommendation follows the consensus statement: Guidelines for Management of Small Pulmonary Nodules Detected on CT Scans: A Statement from the [employer] as published in Radiology 2005; 237:395-400. 4. Atherosclerosis 5. Thyroid nodule. Consider further evaluation with thyroid sonogram.  ASSESSMENT & PLAN: 70 year old female with past medical history of arthritis, presented with anemia and mild thrombocytopenia  1. 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 SPEP and UPEP with immunofixation was negative. No lab evidence of hemolysis, erythropoietin level is normal. Sedimentation rate was normal, no evidence of chronic inflammation. But she was recently diagnosed with rheumatoid arthritis, anemia of chronic disease is still possible and most likely. -I reviewed her bone marrow biopsy results. There is several small lymphoid aggregation, suspicious for low-grade B cell  lymphoproliferative process, especially SLL. Her peripheral white count is normal, no elevated lymphocytes.  -I reviewed her CT scan result, which was negative for adenopathy or spleen medically. -I do not think she has clinical evidence of small lymphocytic lymphoma at this point. I'll follow her clinically, and consider repeat a bone marrow biopsy only if her blood counts get worse.  2. Rheumatoid arthritis -She will continue follow-up with her  primary care physician and rheumatologist  3. Renal failure -Resolved, her creatinine is 1 today.  Plan -Return to follow-up in 3 months with lab.  All questions were answered. The patient knows to call the clinic with any problems, questions or concerns. No barriers to learning was detected. I spent 20 minutes counseling the patient face to face. The total time spent in the appointment was 25 minutes and more than 50% was on counseling and review of test results     Truitt Merle, MD 04/05/2015   10:21 AM

## 2015-04-05 NOTE — Telephone Encounter (Signed)
gave and printed appt sched and avs for pt for July.Marland KitchenMarland KitchenMarland Kitchen

## 2015-05-19 ENCOUNTER — Telehealth: Payer: Self-pay | Admitting: Hematology

## 2015-05-19 NOTE — Telephone Encounter (Signed)
Faxed pt medical records to [facility] [phone]

## 2015-07-05 ENCOUNTER — Other Ambulatory Visit: Payer: Self-pay | Admitting: *Deleted

## 2015-07-05 DIAGNOSIS — D696 Thrombocytopenia, unspecified: Secondary | ICD-10-CM

## 2015-07-06 ENCOUNTER — Ambulatory Visit (HOSPITAL_BASED_OUTPATIENT_CLINIC_OR_DEPARTMENT_OTHER): Payer: Commercial Managed Care - HMO | Admitting: Hematology

## 2015-07-06 ENCOUNTER — Other Ambulatory Visit (HOSPITAL_BASED_OUTPATIENT_CLINIC_OR_DEPARTMENT_OTHER): Payer: Commercial Managed Care - HMO

## 2015-07-06 ENCOUNTER — Telehealth: Payer: Self-pay | Admitting: Hematology

## 2015-07-06 ENCOUNTER — Encounter: Payer: Self-pay | Admitting: Hematology

## 2015-07-06 VITALS — BP 149/61 | HR 77 | Temp 98.2°F | Resp 18 | Ht 62.0 in | Wt 143.7 lb

## 2015-07-06 DIAGNOSIS — M069 Rheumatoid arthritis, unspecified: Secondary | ICD-10-CM

## 2015-07-06 DIAGNOSIS — D649 Anemia, unspecified: Secondary | ICD-10-CM

## 2015-07-06 DIAGNOSIS — D696 Thrombocytopenia, unspecified: Secondary | ICD-10-CM

## 2015-07-06 DIAGNOSIS — D638 Anemia in other chronic diseases classified elsewhere: Secondary | ICD-10-CM | POA: Insufficient documentation

## 2015-07-06 LAB — CBC WITH DIFFERENTIAL/PLATELET
BASO%: 0.3 % (ref 0.0–2.0)
Basophils Absolute: 0 10*3/uL (ref 0.0–0.1)
EOS%: 0.7 % (ref 0.0–7.0)
Eosinophils Absolute: 0.1 10*3/uL (ref 0.0–0.5)
HCT: 32.1 % — ABNORMAL LOW (ref 34.8–46.6)
HGB: 10.8 g/dL — ABNORMAL LOW (ref 11.6–15.9)
LYMPH%: 24.8 % (ref 14.0–49.7)
MCH: 35.5 pg — ABNORMAL HIGH (ref 25.1–34.0)
MCHC: 33.5 g/dL (ref 31.5–36.0)
MCV: 105.8 fL — ABNORMAL HIGH (ref 79.5–101.0)
MONO#: 0.8 10*3/uL (ref 0.1–0.9)
MONO%: 8.8 % (ref 0.0–14.0)
NEUT#: 6.1 10*3/uL (ref 1.5–6.5)
NEUT%: 65.4 % (ref 38.4–76.8)
Platelets: 132 10*3/uL — ABNORMAL LOW (ref 145–400)
RBC: 3.03 10*6/uL — ABNORMAL LOW (ref 3.70–5.45)
RDW: 15.6 % — ABNORMAL HIGH (ref 11.2–14.5)
WBC: 9.3 10*3/uL (ref 3.9–10.3)
lymph#: 2.3 10*3/uL (ref 0.9–3.3)

## 2015-07-06 NOTE — Telephone Encounter (Signed)
Gave and printed appt sched and avs for pt for Jan 2017

## 2015-07-06 NOTE — Progress Notes (Signed)
[place]  [phone] [phone]  Clinic Follow up Note   Patient Care Team: [name], MD as PCP - General (Family Medicine) 07/06/2015  Diagnosis: Anemia  INTERVAL HISTORY: [name] returns for follow-up. She had worsening rheumatoid arthritis related pain, was started on low-dose prednisone about 4-5 weeks ago, 10 mg once daily, and she is tapering it off gradually. She otherwise feels well, no other new symptoms. She has pretty good energy level, has gained some weight from steroids, denies any episodes of bleeding. No fever chills or night sweats.  REVIEW OF SYSTEMS:   Constitutional: Denies fevers, chills or abnormal weight loss, (+) fatigue Eyes: Denies blurriness of vision Ears, nose, mouth, throat, and face: Denies mucositis or sore throat Respiratory: Denies cough, dyspnea or wheezes Cardiovascular: Denies palpitation, chest discomfort or lower extremity swelling Gastrointestinal:  Denies nausea, heartburn or change in bowel habits Skin: Denies abnormal skin rashes Lymphatics: Denies new lymphadenopathy or easy bruising Neurological:Denies numbness, tingling or new weaknesses Behavioral/Psych: Mood is stable, no new changes  All other systems were reviewed with the patient and are negative.  MEDICAL HISTORY:  Past Medical History  Diagnosis Date  . Arthritis   . Hypertension     SURGICAL HISTORY: No past surgical history on file.  I have reviewed the social history and family history with the patient and they are unchanged from previous note.  ALLERGIES:  is allergic to penicillins.  MEDICATIONS:  Current Outpatient Prescriptions  Medication Sig Dispense Refill  . acetaminophen (TYLENOL) 500 MG tablet Take 500 mg by mouth every 6 (six) hours as needed for mild pain.     . B Complex-C (B-COMPLEX WITH VITAMIN C) tablet Take 1 tablet by mouth daily.    . cholecalciferol (VITAMIN D) 1000 UNITS tablet Take 1,000 Units by  mouth daily.    Marland Kitchen gabapentin (NEURONTIN) 300 MG capsule Take 300 mg by mouth 2 (two) times daily.     . metoprolol succinate (TOPROL-XL) 50 MG 24 hr tablet Take 50 mg by mouth every morning.     . predniSONE (DELTASONE) 1 MG tablet Takes 5 mg in AM,  4 mg  in  PM  .   Starting  7/23  -  takes   4 mg  BID.  3  . Probiotic Product (PROBIOTIC DAILY PO) Take 1 capsule by mouth daily.    . valsartan (DIOVAN) 80 MG tablet Take 80 mg by mouth every morning.     . Wheat Dextrin (BENEFIBER) TABS Take 1 tablet by mouth daily.     No current facility-administered medications for this visit.    PHYSICAL EXAMINATION: ECOG PERFORMANCE STATUS: 1 - Symptomatic but completely ambulatory  Filed Vitals:   07/06/15 1046  BP: 149/61  Pulse: 77  Temp: 98.2 F (36.8 C)  Resp: 18   Filed Weights   07/06/15 1046  Weight: 143 lb 11.2 oz (65.182 kg)    GENERAL:alert, no distress and comfortable SKIN: skin color, texture, turgor are normal, no rashes or significant lesions EYES: normal, Conjunctiva are pink and non-injected, sclera clear OROPHARYNX:no exudate, no erythema and lips, buccal mucosa, and tongue normal  NECK: supple, thyroid normal size, non-tender, without nodularity LYMPH:  no palpable lymphadenopathy in the cervical, axillary or inguinal LUNGS: clear to auscultation and percussion with normal breathing effort HEART: regular rate & rhythm and no murmurs and no lower extremity edema ABDOMEN:abdomen soft, non-tender and normal bowel sounds Musculoskeletal:no cyanosis of digits and no clubbing  NEURO: alert &  oriented x 3 with fluent speech, no focal motor/sensory deficits  LABORATORY DATA:  I have reviewed the data as listed CBC Latest Ref Rng 07/06/2015 04/05/2015 02/12/2015  WBC 3.9 - 10.3 10e3/uL 9.3 14.4(H) 6.9  Hemoglobin 11.6 - 15.9 g/dL 10.8(L) 10.6(L) 10.8(L)  Hematocrit 34.8 - 46.6 % 32.1(L) 32.9(L) 33.6(L)  Platelets 145 - 400 10e3/uL 132(L) 235 107(L)     CMP Latest Ref Rng  04/05/2015 02/04/2015 04/29/2011  Glucose 70 - 140 mg/dl 98 137 101(H)  BUN 7.0 - 26.0 mg/dL 22.7 19.3 51(H)  Creatinine 0.6 - 1.1 mg/dL 1.0 1.2(H) 3.90(H)  Sodium 136 - 145 mEq/L 140 143 142  Potassium 3.5 - 5.1 mEq/L 4.6 3.9 4.0  Chloride 96 - 112 mEq/L - - 112  CO2 22 - 29 mEq/L _0 Calcium 8.4 - 10.4 mg/dL 9.9 10.1 9.7  Total Protein 6.4 - 8.3 g/dL 7.3 7.3 -  Total Bilirubin 0.20 - 1.20 mg/dL 0.25 0.54 -  Alkaline Phos 40 - 150 U/L 68 62 -  AST 5 - 34 U/L 20 27 -  ALT 0 - 55 U/L 17 19 -   PATHOLOGY REPORT Bone Marrow, Aspirate,Biopsy, and Clot, right iliac 02/12/2015 - HYPERCELLULAR BONE MARROW FOR AGE WITH TRILINEAGE HEMATOPOIESIS. - SEVERAL SMALL LYMPHOID AGGREGATES ASSOCIATED WITH A MINOR POPULATION OF ABNORMAL B-CELLS . - SEE COMMENT. PERIPHERAL BLOOD: - MACROCYTIC ANEMIA. - THROMBOCYTOPENIA. Diagnosis Note The aspirate material is limited for accurate cytomorphologic evaluation. Nonetheless, scanty hematopoietic elements along with touch imprints show a mixture of myeloid cell types with progressive maturation and lack of significant dyspoiesis. In addition, there are several variably sized but predominantly small lymphoid aggregates mostly composed of small lymphocytes as represented on limited levels of sectioning. Flow cytometric analysis of the lymphoid population shows a small abnormal B cell population estimated at 9% of all cells with expression of pan B cell antigens including CD20 and CD23 with associated co-expression of CD5. Immunohistochemical stains were performed but the number and size of lymphoid aggregates in the clot and biopsy sections is further reduced on deeper sectioning. Nonetheless there is predominance of B-cells with possible weak co-expression of CD5. The limited morphologic and phenotypic lymphoid changes are suspicious for early involvement by a low grade B cell lymphoproliferative process, particularly small lymphocytic lymphoma/chronic  lymphocytic leukemia. Correlation with cytogenetic studies is recommended. (BNS:kh 02-16-15)  Cytogenetics: normal     RADIOGRAPHIC STUDIES: I have personally reviewed the radiological images as listed and agreed with the findings in the report.  CT chest. Abdomen and pelvis 04/02/2015  IMPRESSION: 1. No evidence for mass or adenopathy within the chest abdomen or pelvis. 2. Negative for splenomegaly. 3. 4 mm anterior right upper lobe nodule is identified. If the patient is at high risk for bronchogenic carcinoma, follow-up chest CT at 1year is recommended. If the patient is at low risk, no follow-up is needed. This recommendation follows the consensus statement: Guidelines for Management of Small Pulmonary Nodules Detected on CT Scans: A Statement from the [employer] as published in Radiology 2005; 237:395-400. 4. Atherosclerosis 5. Thyroid nodule. Consider further evaluation with thyroid sonogram.  ASSESSMENT & PLAN: 69 year old female with past medical history of arthritis, presented with anemia and mild thrombocytopenia  1. 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 SPEP and UPEP with immunofixation was negative. No lab evidence of hemolysis, erythropoietin level is  normal. Sedimentation rate was normal, no evidence of chronic inflammation. But she was recently diagnosed with rheumatoid arthritis, anemia of chronic disease is still possible and most likely. -Giving negative bone marrow biopsy, her history of rheumatoid arthritis, and fluctuation of her plt counts, this is likely ITP. No need for treatment given her mild thrombocytopenia. -I reviewed her bone marrow biopsy results. There is several small lymphoid aggregation, suspicious for low-grade B cell lymphoproliferative process, especially SLL. Her peripheral white  count is normal, no elevated lymphocytes.  -I reviewed her CT scan result, which was negative for adenopathy or spleen medically. -I do not think she has clinical evidence of small lymphocytic lymphoma at this point. I'll follow her clinically, and consider repeat a bone marrow biopsy only if her blood counts get worse. -She is clinically doing well, physical exam was negative for adenopathy.  2. Rheumatoid arthritis -She will continue follow-up with her primary care physician and rheumatologist, she is on tapering dose of prednisone  Plan -Return to follow-up in 6 months with lab CBC and diff   All questions were answered. The patient knows to call the clinic with any problems, questions or concerns. No barriers to learning was detected.  I spent 20 minutes counseling the patient face to face. The total time spent in the appointment was 25 minutes and more than 50% was on counseling and review of test results     Truitt Merle, MD 07/06/2015   11:03 AM

## 2016-01-03 ENCOUNTER — Other Ambulatory Visit: Payer: Self-pay | Admitting: *Deleted

## 2016-01-03 DIAGNOSIS — D638 Anemia in other chronic diseases classified elsewhere: Secondary | ICD-10-CM

## 2016-01-03 DIAGNOSIS — D696 Thrombocytopenia, unspecified: Secondary | ICD-10-CM

## 2016-01-04 ENCOUNTER — Ambulatory Visit (HOSPITAL_BASED_OUTPATIENT_CLINIC_OR_DEPARTMENT_OTHER): Payer: Commercial Managed Care - HMO | Admitting: Hematology

## 2016-01-04 ENCOUNTER — Other Ambulatory Visit (HOSPITAL_BASED_OUTPATIENT_CLINIC_OR_DEPARTMENT_OTHER): Payer: Commercial Managed Care - HMO

## 2016-01-04 ENCOUNTER — Encounter: Payer: Self-pay | Admitting: Hematology

## 2016-01-04 VITALS — BP 137/57 | HR 79 | Temp 97.7°F | Resp 18 | Ht 62.0 in | Wt 145.8 lb

## 2016-01-04 DIAGNOSIS — D638 Anemia in other chronic diseases classified elsewhere: Secondary | ICD-10-CM

## 2016-01-04 DIAGNOSIS — M069 Rheumatoid arthritis, unspecified: Secondary | ICD-10-CM | POA: Diagnosis not present

## 2016-01-04 DIAGNOSIS — D649 Anemia, unspecified: Secondary | ICD-10-CM

## 2016-01-04 DIAGNOSIS — D696 Thrombocytopenia, unspecified: Secondary | ICD-10-CM

## 2016-01-04 LAB — COMPREHENSIVE METABOLIC PANEL
ALT: 24 U/L (ref 0–55)
AST: 26 U/L (ref 5–34)
Albumin: 4 g/dL (ref 3.5–5.0)
Alkaline Phosphatase: 61 U/L (ref 40–150)
Anion Gap: 11 mEq/L (ref 3–11)
BUN: 21.7 mg/dL (ref 7.0–26.0)
CO2: 24 mEq/L (ref 22–29)
Calcium: 9.7 mg/dL (ref 8.4–10.4)
Chloride: 106 mEq/L (ref 98–109)
Creatinine: 1.2 mg/dL — ABNORMAL HIGH (ref 0.6–1.1)
EGFR: 44 mL/min/{1.73_m2} — ABNORMAL LOW (ref >90)
Glucose: 105 mg/dl (ref 70–140)
Potassium: 4.5 mEq/L (ref 3.5–5.1)
Sodium: 141 mEq/L (ref 136–145)
Total Bilirubin: 0.41 mg/dL (ref 0.20–1.20)
Total Protein: 7 g/dL (ref 6.4–8.3)

## 2016-01-04 LAB — CBC WITH DIFFERENTIAL/PLATELET
BASO%: 0.5 % (ref 0.0–2.0)
Basophils Absolute: 0.1 10*3/uL (ref 0.0–0.1)
EOS%: 1.6 % (ref 0.0–7.0)
Eosinophils Absolute: 0.2 10*3/uL (ref 0.0–0.5)
HCT: 32.8 % — ABNORMAL LOW (ref 34.8–46.6)
HGB: 10.6 g/dL — ABNORMAL LOW (ref 11.6–15.9)
LYMPH%: 18.3 % (ref 14.0–49.7)
MCH: 34.2 pg — ABNORMAL HIGH (ref 25.1–34.0)
MCHC: 32.4 g/dL (ref 31.5–36.0)
MCV: 105.8 fL — ABNORMAL HIGH (ref 79.5–101.0)
MONO#: 1.2 10*3/uL — ABNORMAL HIGH (ref 0.1–0.9)
MONO%: 9.4 % (ref 0.0–14.0)
NEUT#: 8.9 10*3/uL — ABNORMAL HIGH (ref 1.5–6.5)
NEUT%: 70.2 % (ref 38.4–76.8)
Platelets: 149 10*3/uL (ref 145–400)
RBC: 3.1 10*6/uL — ABNORMAL LOW (ref 3.70–5.45)
RDW: 15 % — ABNORMAL HIGH (ref 11.2–14.5)
WBC: 12.6 10*3/uL — ABNORMAL HIGH (ref 3.9–10.3)
lymph#: 2.3 10*3/uL (ref 0.9–3.3)

## 2016-01-04 NOTE — Progress Notes (Signed)
[place]  [phone] [phone]) [phone]  Clinic Follow up Note   Patient Care Team: [name], MD as PCP - General (Family Medicine) 01/04/2016  Diagnosis: Anemia and thrombocytopenia   INTERVAL HISTORY: [name] returns for follow-up. She is doing well overall. Her prednisone has been gradually tapering by her rheumatologist, she is currently on 6 mg daily now. She still has mild pain at multiple joints, but she is able to function well. She is otherwise doing well, denies any other pain, or other symptoms. No episodes of bleeding. Her energy level is decent.  REVIEW OF SYSTEMS:   Constitutional: Denies fevers, chills or abnormal weight loss, (+) mild fatigue Eyes: Denies blurriness of vision Ears, nose, mouth, throat, and face: Denies mucositis or sore throat Respiratory: Denies cough, dyspnea or wheezes Cardiovascular: Denies palpitation, chest discomfort or lower extremity swelling Gastrointestinal:  Denies nausea, heartburn or change in bowel habits Skin: Denies abnormal skin rashes Lymphatics: Denies new lymphadenopathy or easy bruising Neurological:Denies numbness, tingling or new weaknesses Behavioral/Psych: Mood is stable, no new changes  All other systems were reviewed with the patient and are negative.  MEDICAL HISTORY:  Past Medical History  Diagnosis Date  . Arthritis   . Hypertension     SURGICAL HISTORY: History reviewed. No pertinent past surgical history.  I have reviewed the social history and family history with the patient and they are unchanged from previous note.  ALLERGIES:  is allergic to penicillins.  MEDICATIONS:  Current Outpatient Prescriptions  Medication Sig Dispense Refill  . acetaminophen (TYLENOL) 500 MG tablet Take 500 mg by mouth every 6 (six) hours as needed for mild pain.     . B Complex-C (B-COMPLEX WITH VITAMIN C) tablet Take 1 tablet by mouth daily.    . cholecalciferol (VITAMIN D) 1000 UNITS  tablet Take 1,000 Units by mouth daily.    Marland Kitchen gabapentin (NEURONTIN) 300 MG capsule Take 300 mg by mouth 2 (two) times daily.     . metoprolol succinate (TOPROL-XL) 50 MG 24 hr tablet Take 50 mg by mouth every morning.     . predniSONE (DELTASONE) 1 MG tablet Takes 5 mg in AM,  4 mg  in  PM  .   Starting  7/23  -  takes   4 mg  BID.  3  . Probiotic Product (PROBIOTIC DAILY PO) Take 1 capsule by mouth daily.    . valsartan (DIOVAN) 80 MG tablet Take 80 mg by mouth every morning.     . Wheat Dextrin (BENEFIBER) TABS Take 1 tablet by mouth daily.     No current facility-administered medications for this visit.    PHYSICAL EXAMINATION: ECOG PERFORMANCE STATUS: 1 - Symptomatic but completely ambulatory  Filed Vitals:   01/04/16 1024  BP: 137/57  Pulse: 79  Temp: 97.7 F (36.5 C)  Resp: 18   Filed Weights   01/04/16 1024  Weight: 145 lb 12.8 oz (66.134 kg)    GENERAL:alert, no distress and comfortable SKIN: skin color, texture, turgor are normal, no rashes or significant lesions EYES: normal, Conjunctiva are pink and non-injected, sclera clear OROPHARYNX:no exudate, no erythema and lips, buccal mucosa, and tongue normal  NECK: supple, thyroid normal size, non-tender, without nodularity LYMPH:  no palpable lymphadenopathy in the cervical, axillary or inguinal LUNGS: clear to auscultation and percussion with normal breathing effort HEART: regular rate & rhythm and no murmurs and no lower extremity edema ABDOMEN:abdomen soft, non-tender and normal bowel sounds Musculoskeletal:no cyanosis of digits and  no clubbing  NEURO: alert & oriented x 3 with fluent speech, no focal motor/sensory deficits  LABORATORY DATA:  I have reviewed the data as listed CBC Latest Ref Rng 01/04/2016 07/06/2015 04/05/2015  WBC 3.9 - 10.3 10e3/uL 12.6(H) 9.3 14.4(H)  Hemoglobin 11.6 - 15.9 g/dL 10.6(L) 10.8(L) 10.6(L)  Hematocrit 34.8 - 46.6 % 32.8(L) 32.1(L) 32.9(L)  Platelets 145 - 400 10e3/uL 149 132(L) 235       CMP Latest Ref Rng 04/05/2015 02/04/2015 04/29/2011  Glucose 70 - 140 mg/dl 98 137 101(H)  BUN 7.0 - 26.0 mg/dL 22.7 19.3 51(H)  Creatinine 0.6 - 1.1 mg/dL 1.0 1.2(H) 3.90(H)  Sodium 136 - 145 mEq/L 140 143 142  Potassium 3.5 - 5.1 mEq/L 4.6 3.9 4.0  Chloride 96 - 112 mEq/L - - 112  CO2 22 - 29 mEq/L 24 24 21   Calcium 8.4 - 10.4 mg/dL 9.9 10.1 9.7  Total Protein 6.4 - 8.3 g/dL 7.3 7.3 -  Total Bilirubin 0.20 - 1.20 mg/dL 0.25 0.54 -  Alkaline Phos 40 - 150 U/L 68 62 -  AST 5 - 34 U/L 20 27 -  ALT 0 - 55 U/L 17 19 -   PATHOLOGY REPORT Bone Marrow, Aspirate,Biopsy, and Clot, right iliac 02/12/2015 - HYPERCELLULAR BONE MARROW FOR AGE WITH TRILINEAGE HEMATOPOIESIS. - SEVERAL SMALL LYMPHOID AGGREGATES ASSOCIATED WITH A MINOR POPULATION OF ABNORMAL B-CELLS . - SEE COMMENT. PERIPHERAL BLOOD: - MACROCYTIC ANEMIA. - THROMBOCYTOPENIA. Diagnosis Note The aspirate material is limited for accurate cytomorphologic evaluation. Nonetheless, scanty hematopoietic elements along with touch imprints show a mixture of myeloid cell types with progressive maturation and lack of significant dyspoiesis. In addition, there are several variably sized but predominantly small lymphoid aggregates mostly composed of small lymphocytes as represented on limited levels of sectioning. Flow cytometric analysis of the lymphoid population shows a small abnormal B cell population estimated at 9% of all cells with expression of pan B cell antigens including CD20 and CD23 with associated co-expression of CD5. Immunohistochemical stains were performed but the number and size of lymphoid aggregates in the clot and biopsy sections is further reduced on deeper sectioning. Nonetheless there is predominance of B-cells with possible weak co-expression of CD5. The limited morphologic and phenotypic lymphoid changes are suspicious for early involvement by a low grade B cell lymphoproliferative process, particularly  small lymphocytic lymphoma/chronic lymphocytic leukemia. Correlation with cytogenetic studies is recommended. (BNS:kh 02-16-15)  Cytogenetics: normal     RADIOGRAPHIC STUDIES: I have personally reviewed the radiological images as listed and agreed with the findings in the report.  CT chest. Abdomen and pelvis 04/02/2015  IMPRESSION: 1. No evidence for mass or adenopathy within the chest abdomen or pelvis. 2. Negative for splenomegaly. 3. 4 mm anterior right upper lobe nodule is identified. If the patient is at high risk for bronchogenic carcinoma, follow-up chest CT at 1year is recommended. If the patient is at low risk, no follow-up is needed. This recommendation follows the consensus statement: Guidelines for Management of Small Pulmonary Nodules Detected on CT Scans: A Statement from the [employer] as published in Radiology 2005; 237:395-400. 4. Atherosclerosis 5. Thyroid nodule. Consider further evaluation with thyroid sonogram.  ASSESSMENT & PLAN: 70 year old female with past medical history of arthritis, presented with anemia and mild thrombocytopenia  1. 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 SPEP and UPEP with immunofixation was negative. No  lab evidence of hemolysis, erythropoietin level is normal. Sedimentation rate was normal, no evidence of chronic inflammation. But she was recently diagnosed with rheumatoid arthritis, anemia of chronic disease is still possible and most likely. -Giving negative bone marrow biopsy, her history of rheumatoid arthritis, and fluctuation of her plt counts, this is likely ITP. No need for treatment given her mild thrombocytopenia. -Her bone marrow biopsy showed several small lymphoid aggregation, suspicious for low-grade B cell lymphoproliferative process, especially SLL. Her  peripheral white count is normal, no elevated lymphocytes.  -I reviewed her CT scan result, which was negative for adenopathy or spleen medically. -I do not think she has clinical evidence of small lymphocytic lymphoma at this point. I'll follow her clinically, and consider repeat a bone marrow biopsy only if her blood counts get worse. -She is clinically doing well, physical exam was negative for adenopathy. I reviewed her lab results with her today, which showed normal platelet count, mild and stable anemia, slightly elevated W BC, and absolute lymphocyte count is normal.  2. Rheumatoid arthritis -She will continue follow-up with her primary care physician and rheumatologist, she is on tapering dose of prednisone  Plan -She will see her primary care physician Dr. Laurann Montana next month, and every 6 months. I suggest her to have a repeated CBC with Dr. Delene Ruffini visit -I'll see her back in one year  All questions were answered. The patient knows to call the clinic with any problems, questions or concerns. No barriers to learning was detected.  I spent 20 minutes counseling the patient face to face. The total time spent in the appointment was 25 minutes and more than 50% was on counseling and review of test results     Truitt Merle, MD 01/04/2016   10:29 AM

## 2016-01-26 ENCOUNTER — Other Ambulatory Visit (HOSPITAL_COMMUNITY): Payer: Self-pay | Admitting: Respiratory Therapy

## 2016-01-26 DIAGNOSIS — R062 Wheezing: Secondary | ICD-10-CM

## 2016-01-26 DIAGNOSIS — R0602 Shortness of breath: Secondary | ICD-10-CM

## 2016-02-04 ENCOUNTER — Encounter (HOSPITAL_COMMUNITY): Payer: Commercial Managed Care - HMO

## 2016-02-04 ENCOUNTER — Ambulatory Visit (HOSPITAL_COMMUNITY)
Admission: RE | Admit: 2016-02-04 | Discharge: 2016-02-04 | Disposition: A | Discharge status: Home / Self Care | Payer: Commercial Managed Care - HMO | Source: Ambulatory Visit | Attending: Family Medicine | Admitting: Family Medicine

## 2016-02-04 DIAGNOSIS — R0602 Shortness of breath: Secondary | ICD-10-CM | POA: Diagnosis present

## 2016-02-04 DIAGNOSIS — R062 Wheezing: Secondary | ICD-10-CM | POA: Diagnosis not present

## 2016-02-04 LAB — PULMONARY FUNCTION TEST
DL/VA % pred: 70 %
DL/VA: 3.3 ml/min/mmHg/L
DLCO unc % pred: 56 %
DLCO unc: 12.99 ml/min/mmHg
FEF 25-75 Post: 1.21 L/sec
FEF 25-75 Pre: 0.91 L/sec
FEF2575-%Change-Post: 32 %
FEF2575-%Pred-Post: 64 %
FEF2575-%Pred-Pre: 48 %
FEV1-%Change-Post: 6 %
FEV1-%Pred-Post: 77 %
FEV1-%Pred-Pre: 72 %
FEV1-Post: 1.7 L
FEV1-Pre: 1.6 L
FEV1FVC-%Change-Post: 0 %
FEV1FVC-%Pred-Pre: 91 %
FEV6-%Change-Post: 5 %
FEV6-%Pred-Post: 87 %
FEV6-%Pred-Pre: 82 %
FEV6-Post: 2.42 L
FEV6-Pre: 2.3 L
FEV6FVC-%Change-Post: 0 %
FEV6FVC-%Pred-Post: 103 %
FEV6FVC-%Pred-Pre: 103 %
FVC-%Change-Post: 6 %
FVC-%Pred-Post: 84 %
FVC-%Pred-Pre: 79 %
FVC-Post: 2.45 L
FVC-Pre: 2.31 L
Post FEV1/FVC ratio: 69 %
Post FEV6/FVC ratio: 99 %
Pre FEV1/FVC ratio: 69 %
Pre FEV6/FVC Ratio: 99 %
RV % pred: 84 %
RV: 1.79 L
TLC % pred: 86 %
TLC: 4.25 L

## 2016-02-04 MED ORDER — ALBUTEROL SULFATE (2.5 MG/3ML) 0.083% IN NEBU
2.5000 mg | INHALATION_SOLUTION | Freq: Once | RESPIRATORY_TRACT | Status: AC
  Administered 2016-02-04: 2.5 mg via RESPIRATORY_TRACT

## 2016-02-15 ENCOUNTER — Other Ambulatory Visit: Payer: Self-pay | Admitting: Acute Care

## 2016-02-15 DIAGNOSIS — Z87891 Personal history of nicotine dependence: Secondary | ICD-10-CM

## 2016-02-21 ENCOUNTER — Ambulatory Visit (INDEPENDENT_AMBULATORY_CARE_PROVIDER_SITE_OTHER): Payer: Commercial Managed Care - HMO | Admitting: Acute Care

## 2016-02-21 ENCOUNTER — Encounter: Payer: Self-pay | Admitting: Acute Care

## 2016-02-21 ENCOUNTER — Ambulatory Visit (INDEPENDENT_AMBULATORY_CARE_PROVIDER_SITE_OTHER)
Admission: RE | Admit: 2016-02-21 | Discharge: 2016-02-21 | Disposition: A | Discharge status: Home / Self Care | Payer: Commercial Managed Care - HMO | Source: Ambulatory Visit | Attending: Internal Medicine | Admitting: Internal Medicine

## 2016-02-21 DIAGNOSIS — Z87891 Personal history of nicotine dependence: Secondary | ICD-10-CM | POA: Diagnosis not present

## 2016-02-21 NOTE — Progress Notes (Signed)
Shared Decision Making Visit Lung Cancer Screening Program [phone])   Eligibility:  Age 70 y.o.  Pack Years Smoking History Calculation 30-pack-year smoking history (# packs/per year x # years smoked)  Recent History of coughing up blood no  Unexplained weight loss? No ( >Than 15 pounds within the last 6 months )  Prior History Lung / other cancer:no (Diagnosis within the last 5 years already requiring surveillance chest CT Scans).  Smoking Status : Former smoker  Former Smokers: Years since quit: 12 years  Quit Date: August 2004  Visit Components:  Discussion included one or more decision making aids. Yes  Discussion included risk/benefits of screening. Yes  Discussion included potential follow up diagnostic testing for abnormal scans. Yes  Discussion included meaning and risk of over diagnosis. Yes  Discussion included meaning and risk of False Positives. Yes  Discussion included meaning of total radiation exposure. Yes  Counseling Included:  Importance of adherence to annual lung cancer LDCT screening. Yes  Impact of comorbidities on ability to participate in the program. Yes  Ability and willingness to under diagnostic treatment. Yes  Smoking Cessation Counseling:  Current Smokers:   Discussed importance of smoking cessation. Applicable former smoker  Information about tobacco cessation classes and interventions provided to patient. {Not applicable former smoker  Patient provided with "ticket" for LDCT Scan. Yes  Symptomatic Patient. No  Counseling not applicable former smoker  Diagnosis Code: Tobacco Use Z72.0  Asymptomatic Patient yes  Counseling not applicable former smoker  Former Smokers:   Discussed the importance of maintaining cigarette abstinence. Yes  Diagnosis Code: Personal History of Nicotine Dependence. Z48.270  Information about tobacco cessation classes and interventions provided to patient. Yes  Patient provided with "ticket"  for LDCT Scan. Yes Written Order for Lung Cancer Screening with LDCT placed in Epic. Yes  (CT Chest Lung Cancer Screening Low Dose W/O CM) [id number] Z12.2-Screening of respiratory organs Z87.891-Personal history of nicotine dependence   I spent 15 minutes of face to face time with Mrs. Spillerce discussing the risks and benefits of lung cancer screening. We viewed a power point together that explained in detail the above noted topics. We took the time to pause the power point at intervals to allow for questions to be asked and answered to ensure understanding. We discussed that she had  taken the single most powerful action possible to decrease her  risk of developing lung cancer when she quit smoking. I counseled her  to remain smoke free, and to contact me if she ever had the desire to smoke again so that I can provide resources and tools to help support the effort to remain smoke free. We discussed the time and location of the scan, and that either [place] or I will call with the results within  24-48 hours of receiving them. She has my card and contact information in the event she needs to speak with me, in addition to a copy of the power point we reviewed as a resource. She verbalized understanding of all of the above and had no further questions upon leaving the office.   Magdalen Spatz, NP

## 2016-02-22 ENCOUNTER — Other Ambulatory Visit: Payer: Self-pay | Admitting: Acute Care

## 2016-02-22 DIAGNOSIS — Z87891 Personal history of nicotine dependence: Secondary | ICD-10-CM

## 2016-03-06 ENCOUNTER — Other Ambulatory Visit: Payer: Self-pay | Admitting: Acute Care

## 2016-03-06 DIAGNOSIS — Z87891 Personal history of nicotine dependence: Secondary | ICD-10-CM

## 2016-07-27 ENCOUNTER — Other Ambulatory Visit: Payer: Self-pay

## 2016-07-27 DIAGNOSIS — Z720 Tobacco use: Secondary | ICD-10-CM

## 2016-11-03 ENCOUNTER — Other Ambulatory Visit: Payer: Self-pay | Admitting: Rheumatology

## 2016-11-03 DIAGNOSIS — M25511 Pain in right shoulder: Secondary | ICD-10-CM

## 2016-11-14 ENCOUNTER — Ambulatory Visit
Admission: RE | Admit: 2016-11-14 | Discharge: 2016-11-14 | Disposition: A | Discharge status: Home / Self Care | Payer: Commercial Managed Care - HMO | Source: Ambulatory Visit | Attending: Rheumatology | Admitting: Rheumatology

## 2016-11-14 DIAGNOSIS — M25511 Pain in right shoulder: Secondary | ICD-10-CM

## 2017-01-01 DIAGNOSIS — M25511 Pain in right shoulder: Secondary | ICD-10-CM | POA: Diagnosis not present

## 2017-01-01 DIAGNOSIS — M25512 Pain in left shoulder: Secondary | ICD-10-CM | POA: Diagnosis not present

## 2017-01-01 DIAGNOSIS — E790 Hyperuricemia without signs of inflammatory arthritis and tophaceous disease: Secondary | ICD-10-CM | POA: Diagnosis not present

## 2017-01-01 DIAGNOSIS — M353 Polymyalgia rheumatica: Secondary | ICD-10-CM | POA: Diagnosis not present

## 2017-01-01 DIAGNOSIS — Z79899 Other long term (current) drug therapy: Secondary | ICD-10-CM | POA: Diagnosis not present

## 2017-01-01 DIAGNOSIS — M0579 Rheumatoid arthritis with rheumatoid factor of multiple sites without organ or systems involvement: Secondary | ICD-10-CM | POA: Diagnosis not present

## 2017-01-01 DIAGNOSIS — Z6823 Body mass index (BMI) 23.0-23.9, adult: Secondary | ICD-10-CM | POA: Diagnosis not present

## 2017-01-03 ENCOUNTER — Encounter: Payer: Self-pay | Admitting: Hematology

## 2017-01-03 ENCOUNTER — Telehealth: Payer: Self-pay | Admitting: Hematology

## 2017-01-03 ENCOUNTER — Ambulatory Visit (HOSPITAL_BASED_OUTPATIENT_CLINIC_OR_DEPARTMENT_OTHER): Payer: Medicare Other

## 2017-01-03 ENCOUNTER — Ambulatory Visit (HOSPITAL_BASED_OUTPATIENT_CLINIC_OR_DEPARTMENT_OTHER): Payer: Medicare Other | Admitting: Hematology

## 2017-01-03 VITALS — BP 147/68 | HR 72 | Temp 97.9°F | Resp 18 | Ht 62.0 in | Wt 133.0 lb

## 2017-01-03 DIAGNOSIS — D649 Anemia, unspecified: Secondary | ICD-10-CM

## 2017-01-03 DIAGNOSIS — M069 Rheumatoid arthritis, unspecified: Secondary | ICD-10-CM

## 2017-01-03 DIAGNOSIS — D696 Thrombocytopenia, unspecified: Secondary | ICD-10-CM

## 2017-01-03 DIAGNOSIS — D638 Anemia in other chronic diseases classified elsewhere: Secondary | ICD-10-CM

## 2017-01-03 DIAGNOSIS — R5383 Other fatigue: Secondary | ICD-10-CM

## 2017-01-03 LAB — CBC & DIFF AND RETIC
BASO%: 0.1 % (ref 0.0–2.0)
Basophils Absolute: 0 10*3/uL (ref 0.0–0.1)
EOS%: 0.5 % (ref 0.0–7.0)
Eosinophils Absolute: 0.1 10*3/uL (ref 0.0–0.5)
HCT: 32.8 % — ABNORMAL LOW (ref 34.8–46.6)
HGB: 10.8 g/dL — ABNORMAL LOW (ref 11.6–15.9)
Immature Retic Fract: 6.5 % (ref 1.60–10.00)
LYMPH%: 27.3 % (ref 14.0–49.7)
MCH: 33.9 pg (ref 25.1–34.0)
MCHC: 32.9 g/dL (ref 31.5–36.0)
MCV: 102.8 fL — ABNORMAL HIGH (ref 79.5–101.0)
MONO#: 1.1 10*3/uL — ABNORMAL HIGH (ref 0.1–0.9)
MONO%: 10.1 % (ref 0.0–14.0)
NEUT#: 6.7 10*3/uL — ABNORMAL HIGH (ref 1.5–6.5)
NEUT%: 62 % (ref 38.4–76.8)
Platelets: 163 10*3/uL (ref 145–400)
RBC: 3.19 10*6/uL — ABNORMAL LOW (ref 3.70–5.45)
RDW: 14.3 % (ref 11.2–14.5)
Retic %: 2.06 % (ref 0.70–2.10)
Retic Ct Abs: 65.71 10*3/uL (ref 33.70–90.70)
WBC: 10.8 10*3/uL — ABNORMAL HIGH (ref 3.9–10.3)
lymph#: 2.9 10*3/uL (ref 0.9–3.3)

## 2017-01-03 NOTE — Progress Notes (Signed)
[facility]  Telephone:([phone]) [phone] Fax:([phone]) [phone]  Clinic Follow up Note   Patient Care Team: [name], MD as PCP - General (Family Medicine) 01/03/2017  Diagnosis: Anemia and thrombocytopenia   INTERVAL HISTORY: [name] returns for follow-up. She was last seen by me 1 year ago. She has been struggling with her bilateral shoulder pain, and she is going to have right shoulder surgery next month. She still on tapering dose prednisone, and Enbrel for her rheumatoid arthritis. She has mild fatigue, which is stable. No bleeding or other new complaints.  REVIEW OF SYSTEMS:   Constitutional: Denies fevers, chills or abnormal weight loss, (+) mild fatigue Eyes: Denies blurriness of vision Ears, nose, mouth, throat, and face: Denies mucositis or sore throat Respiratory: Denies cough, dyspnea or wheezes Cardiovascular: Denies palpitation, chest discomfort or lower extremity swelling Gastrointestinal:  Denies nausea, heartburn or change in bowel habits Skin: Denies abnormal skin rashes Lymphatics: Denies new lymphadenopathy or easy bruising Neurological:Denies numbness, tingling or new weaknesses Behavioral/Psych: Mood is stable, no new changes  All other systems were reviewed with the patient and are negative.  MEDICAL HISTORY:  Past Medical History:  Diagnosis Date  . Arthritis   . Hypertension     SURGICAL HISTORY: History reviewed. No pertinent surgical history.  I have reviewed the social history and family history with the patient and they are unchanged from previous note.  ALLERGIES:  is allergic to hydroxychloroquine and penicillins.  MEDICATIONS:  Current Outpatient Prescriptions  Medication Sig Dispense Refill  . acetaminophen (TYLENOL) 500 MG tablet Take 500 mg by mouth every 6 (six) hours as needed for mild pain.     . B Complex-C (B-COMPLEX WITH VITAMIN C) tablet Take 1 tablet by mouth daily.    . cholecalciferol (VITAMIN D) 1000 UNITS  tablet Take 1,000 Units by mouth daily.    Marland Kitchen etanercept (ENBREL SURECLICK) 50 MG/ML injection     . gabapentin (NEURONTIN) 300 MG capsule Take 300 mg by mouth 2 (two) times daily.     . Ipratropium-Albuterol (COMBIVENT RESPIMAT) 20-100 MCG/ACT AERS respimat Inhale 1 puff into the lungs every 6 (six) hours.    . metoprolol succinate (TOPROL-XL) 50 MG 24 hr tablet Take 50 mg by mouth every morning.     . predniSONE (DELTASONE) 1 MG tablet 01/03/15-taking 6 mg daily  3  . Probiotic Product (PROBIOTIC DAILY PO) Take 1 capsule by mouth daily.    . valsartan (DIOVAN) 80 MG tablet Take 80 mg by mouth every morning.     . Wheat Dextrin (BENEFIBER) TABS Take 1 tablet by mouth daily.     No current facility-administered medications for this visit.     PHYSICAL EXAMINATION: ECOG PERFORMANCE STATUS: 1 - Symptomatic but completely ambulatory  Vitals:   01/03/17 1131  BP: (!) 147/68  Pulse: 72  Resp: 18  Temp: 97.9 F (36.6 C)   Filed Weights   01/03/17 1131  Weight: 133 lb (60.3 kg)    GENERAL:alert, no distress and comfortable SKIN: skin color, texture, turgor are normal, no rashes or significant lesions EYES: normal, Conjunctiva are pink and non-injected, sclera clear OROPHARYNX:no exudate, no erythema and lips, buccal mucosa, and tongue normal  NECK: supple, thyroid normal size, non-tender, without nodularity LYMPH:  no palpable lymphadenopathy in the cervical, axillary or inguinal LUNGS: clear to auscultation and percussion with normal breathing effort HEART: regular rate & rhythm and no murmurs and no lower extremity edema ABDOMEN:abdomen soft, non-tender and normal bowel sounds Musculoskeletal:no cyanosis  of digits and no clubbing  NEURO: alert & oriented x 3 with fluent speech, no focal motor/sensory deficits  LABORATORY DATA:  I have reviewed the data as listed CBC Latest Ref Rng & Units 01/03/2017 01/04/2016 07/06/2015  WBC 3.9 - 10.3 10e3/uL 10.8(H) 12.6(H) 9.3  Hemoglobin 11.6  - 15.9 g/dL 10.8(L) 10.6(L) 10.8(L)  Hematocrit 34.8 - 46.6 % 32.8(L) 32.8(L) 32.1(L)  Platelets 145 - 400 10e3/uL 163 149 132(L)     CMP Latest Ref Rng & Units 01/04/2016 04/05/2015 02/04/2015  Glucose 70 - 140 mg/dl 105 98 137  BUN 7.0 - 26.0 mg/dL 21.7 22.7 19.3  Creatinine 0.6 - 1.1 mg/dL 1.2(H) 1.0 1.2(H)  Sodium 136 - 145 mEq/L 141 140 143  Potassium 3.5 - 5.1 mEq/L 4.5 4.6 3.9  Chloride 96 - 112 mEq/L - - -  CO2 22 - 29 mEq/L _0 Calcium 8.4 - 10.4 mg/dL 9.7 9.9 10.1  Total Protein 6.4 - 8.3 g/dL 7.0 7.3 7.3  Total Bilirubin 0.20 - 1.20 mg/dL 0.41 0.25 0.54  Alkaline Phos 40 - 150 U/L 61 68 62  AST 5 - 34 U/L _1 ALT 0 - 55 U/L _2 PATHOLOGY REPORT Bone Marrow, Aspirate,Biopsy, and Clot, right iliac 02/12/2015 - HYPERCELLULAR BONE MARROW FOR AGE WITH TRILINEAGE HEMATOPOIESIS. - SEVERAL SMALL LYMPHOID AGGREGATES ASSOCIATED WITH A MINOR POPULATION OF ABNORMAL B-CELLS . - SEE COMMENT. PERIPHERAL BLOOD: - MACROCYTIC ANEMIA. - THROMBOCYTOPENIA. Diagnosis Note The aspirate material is limited for accurate cytomorphologic evaluation. Nonetheless, scanty hematopoietic elements along with touch imprints show a mixture of myeloid cell types with progressive maturation and lack of significant dyspoiesis. In addition, there are several variably sized but predominantly small lymphoid aggregates mostly composed of small lymphocytes as represented on limited levels of sectioning. Flow cytometric analysis of the lymphoid population shows a small abnormal B cell population estimated at 9% of all cells with expression of pan B cell antigens including CD20 and CD23 with associated co-expression of CD5. Immunohistochemical stains were performed but the number and size of lymphoid aggregates in the clot and biopsy sections is further reduced on deeper sectioning. Nonetheless there is predominance of B-cells with possible weak co-expression of CD5. The limited morphologic and  phenotypic lymphoid changes are suspicious for early involvement by a low grade B cell lymphoproliferative process, particularly small lymphocytic lymphoma/chronic lymphocytic leukemia. Correlation with cytogenetic studies is recommended. (BNS:kh 02-16-15)  Cytogenetics: normal     RADIOGRAPHIC STUDIES: I have personally reviewed the radiological images as listed and agreed with the findings in the report.  CT chest. Abdomen and pelvis 04/02/2015  IMPRESSION: 1. No evidence for mass or adenopathy within the chest abdomen or pelvis. 2. Negative for splenomegaly. 3. 4 mm anterior right upper lobe nodule is identified. If the patient is at high risk for bronchogenic carcinoma, follow-up chest CT at 1year is recommended. If the patient is at low risk, no follow-up is needed. This recommendation follows the consensus statement: Guidelines for Management of Small Pulmonary Nodules Detected on CT Scans: A Statement from the [place] as published in Radiology 2005; 237:395-400. 4. Atherosclerosis 5. Thyroid nodule. Consider further evaluation with thyroid sonogram.  ASSESSMENT & PLAN: 71 y.o.  female with past medical history of arthritis, presented with anemia and mild thrombocytopenia  1. 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 SPEP and UPEP with immunofixation was negative. No lab evidence of hemolysis, erythropoietin level is normal. Sedimentation rate was normal, no evidence of chronic inflammation. But she was recently diagnosed with rheumatoid arthritis, anemia of chronic disease is still possible and most likely. -Giving negative bone marrow biopsy, her history of rheumatoid arthritis, and fluctuation of her plt counts, this is likely ITP. No need for treatment given her mild thrombocytopenia. -Her bone marrow  biopsy showed several small lymphoid aggregation, suspicious for low-grade B cell lymphoproliferative process, especially SLL. Her peripheral white count is normal, no elevated lymphocytes.  -I previously reviewed her CT scan result from 03/2015, which was negative for adenopathy or spleen medically. -I do not think she has clinical evidence of small lymphocytic lymphoma at this point. I'll follow her clinically, and consider repeat a bone marrow biopsy only if her blood counts get worse. -She is clinically doing well, physical exam was negative for adenopathy.  -Lab reviewed, her moderate anemia stable, mild leukopenia, absolute lymph count and normal, platelet count is normal today.    2. Rheumatoid arthritis -She will continue follow-up with her primary care physician and rheumatologist, she is on tapering dose of prednisone -she is scheduled to have right shoulder surgery next month  Plan -lab reviewed  -I'll see her back in one year  All questions were answered. The patient knows to call the clinic with any problems, questions or concerns. No barriers to learning was detected.  I spent 10 minutes counseling the patient face to face. The total time spent in the appointment was 15 minutes and more than 50% was on counseling and review of test results     Truitt Merle, MD 01/03/2017   12:31 PM

## 2017-01-03 NOTE — Telephone Encounter (Signed)
Appointments scheduled per 1/10 LOS. Patient given AVS report and calendars with future scheduled appointments. °

## 2017-01-16 NOTE — H&P (Signed)
  [name] is an 70 y.o. female.    Chief Complaint: right shoulder pain  HPI: Pt is a 71 y.o. female complaining of right shoulder pain for multiple years. Pain had continually increased since the beginning. X-rays in the clinic show end-stage arthritic changes of the right shoulder. Pt has tried various conservative treatments which have failed to alleviate their symptoms, including injections and therapy. Various options are discussed with the patient. Risks, benefits and expectations were discussed with the patient. Patient understand the risks, benefits and expectations and wishes to proceed with surgery.   PCP:  Osborne Casco, MD  D/C Plans: Home  PMH: Past Medical History:  Diagnosis Date  . Arthritis   . Hypertension     PSH: No past surgical history on file.  Social History:  reports that she quit smoking about 13 years ago. She has a 30.00 pack-year smoking history. She has never used smokeless tobacco. She reports that she drinks alcohol. She reports that she does not use drugs.  Allergies:  Allergies  Allergen Reactions  . Hydroxychloroquine Itching  . Penicillins Hives    Medications: No current facility-administered medications for this encounter.    Current Outpatient Prescriptions  Medication Sig Dispense Refill  . acetaminophen (TYLENOL) 500 MG tablet Take 500 mg by mouth every 6 (six) hours as needed for mild pain.     . B Complex-C (B-COMPLEX WITH VITAMIN C) tablet Take 1 tablet by mouth daily.    Marland Kitchen BIOTIN PO Take 1 tablet by mouth daily.    . cholecalciferol (VITAMIN D) 1000 UNITS tablet Take 1,000 Units by mouth daily.    Marland Kitchen etanercept (ENBREL SURECLICK) 50 MG/ML injection     . gabapentin (NEURONTIN) 300 MG capsule Take 300 mg by mouth 2 (two) times daily.     . Ipratropium-Albuterol (COMBIVENT RESPIMAT) 20-100 MCG/ACT AERS respimat Inhale 1 puff into the lungs every 6 (six) hours.    . metoprolol succinate (TOPROL-XL) 50 MG 24 hr tablet  Take 50 mg by mouth every morning.     Marland Kitchen OVER THE COUNTER MEDICATION Take 1 tablet by mouth daily. Tumeric    . predniSONE (DELTASONE) 1 MG tablet 01/03/15-taking 6 mg daily  3  . valsartan (DIOVAN) 80 MG tablet Take 80 mg by mouth every morning.     . Wheat Dextrin (BENEFIBER) TABS Take 1 tablet by mouth daily.      No results found for this or any previous visit (from the past 48 hour(s)). No results found.  ROS: Pain with rom of the right upper extremity  Physical Exam:  Alert and oriented 71 y.o. female in no acute distress Cranial nerves 2-12 intact Cervical spine: full rom with no tenderness, nv intact distally Chest: active breath sounds bilaterally, no wheeze rhonchi or rales Heart: regular rate and rhythm, no murmur Abd: non tender non distended with active bowel sounds Hip is stable with rom  Right shoulder with limited rom and weakness with ER and IR No rashes or signs of infection  Assessment/Plan Assessment: right shoulder cuff arthropathy  Plan: Patient will undergo a right reverse total shoulder by Dr. Veverly Fells at [facility]. Risks benefits and expectations were discussed with the patient. Patient understand risks, benefits and expectations and wishes to proceed.

## 2017-01-26 ENCOUNTER — Encounter (HOSPITAL_COMMUNITY)
Admission: RE | Admit: 2017-01-26 | Discharge: 2017-01-26 | Disposition: A | Discharge status: Home / Self Care | Payer: Medicare Other | Source: Ambulatory Visit | Attending: Orthopedic Surgery | Admitting: Orthopedic Surgery

## 2017-01-26 ENCOUNTER — Other Ambulatory Visit (HOSPITAL_COMMUNITY): Payer: Self-pay | Admitting: *Deleted

## 2017-01-26 ENCOUNTER — Encounter (HOSPITAL_COMMUNITY): Payer: Self-pay

## 2017-01-26 DIAGNOSIS — I10 Essential (primary) hypertension: Secondary | ICD-10-CM | POA: Diagnosis not present

## 2017-01-26 DIAGNOSIS — Z01812 Encounter for preprocedural laboratory examination: Secondary | ICD-10-CM | POA: Diagnosis not present

## 2017-01-26 DIAGNOSIS — Z01810 Encounter for preprocedural cardiovascular examination: Secondary | ICD-10-CM | POA: Diagnosis not present

## 2017-01-26 HISTORY — DX: Chronic kidney disease, unspecified: N18.9

## 2017-01-26 HISTORY — DX: Dyspnea, unspecified: R06.00

## 2017-01-26 HISTORY — DX: Headache: R51

## 2017-01-26 HISTORY — DX: Headache, unspecified: R51.9

## 2017-01-26 HISTORY — DX: Family history of other specified conditions: Z84.89

## 2017-01-26 HISTORY — DX: Flushing: R23.2

## 2017-01-26 HISTORY — DX: Iron deficiency anemia, unspecified: D50.9

## 2017-01-26 HISTORY — DX: Celiac disease: K90.0

## 2017-01-26 LAB — CBC
HCT: 33.2 % — ABNORMAL LOW (ref 36.0–46.0)
Hemoglobin: 10.6 g/dL — ABNORMAL LOW (ref 12.0–15.0)
MCH: 32.7 pg (ref 26.0–34.0)
MCHC: 31.9 g/dL (ref 30.0–36.0)
MCV: 102.5 fL — ABNORMAL HIGH (ref 78.0–100.0)
Platelets: 182 10*3/uL (ref 150–400)
RBC: 3.24 MIL/uL — ABNORMAL LOW (ref 3.87–5.11)
RDW: 14 % (ref 11.5–15.5)
WBC: 11.5 10*3/uL — ABNORMAL HIGH (ref 4.0–10.5)

## 2017-01-26 LAB — BASIC METABOLIC PANEL
Anion gap: 9 (ref 5–15)
BUN: 21 mg/dL — ABNORMAL HIGH (ref 6–20)
CO2: 23 mmol/L (ref 22–32)
Calcium: 9.6 mg/dL (ref 8.9–10.3)
Chloride: 102 mmol/L (ref 101–111)
Creatinine, Ser: 1.49 mg/dL — ABNORMAL HIGH (ref 0.44–1.00)
GFR calc Af Amer: 40 mL/min — ABNORMAL LOW (ref >60)
GFR calc non Af Amer: 34 mL/min — ABNORMAL LOW (ref >60)
Glucose, Bld: 134 mg/dL — ABNORMAL HIGH (ref 65–99)
Potassium: 4.5 mmol/L (ref 3.5–5.1)
Sodium: 134 mmol/L — ABNORMAL LOW (ref 135–145)

## 2017-01-26 LAB — SURGICAL PCR SCREEN
MRSA, PCR: NEGATIVE
Staphylococcus aureus: NEGATIVE

## 2017-01-26 NOTE — Progress Notes (Signed)
Pt denies cardiac history or chest pain. Pt has sob at times with exertion, hx of COPD. Pt has stopped her Enbrel already, but Dr. Trudie Reed (endocrinologist) wants her to continue her Prednisone.

## 2017-01-26 NOTE — Pre-Procedure Instructions (Signed)
[name]  01/26/2017    Your procedure is scheduled on Friday, February 02, 2017 at 10:00 AM.   Report to [facility] Entrance "A" Admitting Office at 8:00 AM.   Call this number if you have problems the morning of surgery: [phone]   Questions prior to day of surgery, please call [phone] between 8 & 4 PM.   Remember:  Do not eat food or drink liquids after midnight Thursday, 02/01/17.  Take these medicines the morning of surgery with A SIP OF WATER: Gabapentin (Neurontin), Loratadine (Claritin), Metoprolol (Toprol XL), Prednisone, Combivent inhaler, Tylenol - if needed  Stop Herbal medications (Biotin, Tumeric, etc) and Tylenol Sinus 7 days prior to surgery. Do not use Aspirin products of NSAIDS (Ibuprofen, Aleve, etc.) 5 days prior to surgery.   Do not wear jewelry, make-up or nail polish.  Do not wear lotions, powders, perfumes or deodorant.  Do not shave 48 hours prior to surgery.    Do not bring valuables to the hospital.  [facility] is not responsible for any belongings or valuables.  Contacts, dentures or bridgework may not be worn into surgery.  Leave your suitcase in the car.  After surgery it may be brought to your room.  For patients admitted to the hospital, discharge time will be determined by your treatment team.  Special instructions: [place] - Preparing for Surgery  Before surgery, you can play an important role.  Because skin is not sterile, your skin needs to be as free of germs as possible.  You can reduce the number of germs on you skin by washing with CHG (chlorahexidine gluconate) soap before surgery.  CHG is an antiseptic cleaner which kills germs and bonds with the skin to continue killing germs even after washing.  Please DO NOT use if you have an allergy to CHG or antibacterial soaps.  If your skin becomes reddened/irritated stop using the CHG and inform your nurse when you arrive at Short Stay.  Do not shave (including legs and  underarms) for at least 48 hours prior to the first CHG shower.  You may shave your face.  Please follow these instructions carefully:   1.  Shower with CHG Soap the night before surgery and the                    morning of Surgery.  2.  If you choose to wash your hair, wash your hair first as usual with your       normal shampoo.  3.  After you shampoo, rinse your hair and body thoroughly to remove the shampoo.  4.  Use CHG as you would any other liquid soap.  You can apply chg directly       to the skin and wash gently with scrungie or a clean washcloth.  5.  Apply the CHG Soap to your body ONLY FROM THE NECK DOWN.        Do not use on open wounds or open sores.  Avoid contact with your eyes, ears, mouth and genitals (private parts).  Wash genitals (private parts) with your normal soap.  6.  Wash thoroughly, paying special attention to the area where your surgery        will be performed.  7.  Thoroughly rinse your body with warm water from the neck down.  8.  DO NOT shower/wash with your normal soap after using and rinsing off       the  CHG Soap.  9.  Pat yourself dry with a clean towel.            10.  Wear clean pajamas.            11.  Place clean sheets on your bed the night of your first shower and do not        sleep with pets.  Day of Surgery  Do not apply any lotions/deodorants the morning of surgery.  Please wear clean clothes to the hospital.   Please read over the fact sheets that you were given.

## 2017-01-31 DIAGNOSIS — E7800 Pure hypercholesterolemia, unspecified: Secondary | ICD-10-CM | POA: Diagnosis not present

## 2017-01-31 DIAGNOSIS — I129 Hypertensive chronic kidney disease with stage 1 through stage 4 chronic kidney disease, or unspecified chronic kidney disease: Secondary | ICD-10-CM | POA: Diagnosis not present

## 2017-01-31 DIAGNOSIS — N183 Chronic kidney disease, stage 3 (moderate): Secondary | ICD-10-CM | POA: Diagnosis not present

## 2017-01-31 DIAGNOSIS — J449 Chronic obstructive pulmonary disease, unspecified: Secondary | ICD-10-CM | POA: Diagnosis not present

## 2017-02-01 MED ORDER — CLINDAMYCIN PHOSPHATE 900 MG/50ML IV SOLN
900.0000 mg | INTRAVENOUS | Status: AC
  Administered 2017-02-02: 900 mg via INTRAVENOUS
  Filled 2017-02-01: qty 50

## 2017-02-02 ENCOUNTER — Inpatient Hospital Stay (HOSPITAL_COMMUNITY): Payer: Medicare Other | Admitting: Anesthesiology

## 2017-02-02 ENCOUNTER — Encounter (HOSPITAL_COMMUNITY): Payer: Self-pay | Admitting: *Deleted

## 2017-02-02 ENCOUNTER — Inpatient Hospital Stay (HOSPITAL_COMMUNITY): Payer: Medicare Other

## 2017-02-02 ENCOUNTER — Encounter (HOSPITAL_COMMUNITY)
Admission: RE | Disposition: A | Discharge status: Home / Self Care | Payer: Self-pay | Source: Ambulatory Visit | Attending: Orthopedic Surgery

## 2017-02-02 ENCOUNTER — Inpatient Hospital Stay (HOSPITAL_COMMUNITY)
Admission: RE | Admit: 2017-02-02 | Discharge: 2017-02-03 | DRG: 483 | Disposition: A | Discharge status: Home / Self Care | Payer: Medicare Other | Source: Ambulatory Visit | Attending: Orthopedic Surgery | Admitting: Orthopedic Surgery

## 2017-02-02 DIAGNOSIS — N183 Chronic kidney disease, stage 3 (moderate): Secondary | ICD-10-CM | POA: Diagnosis present

## 2017-02-02 DIAGNOSIS — G8918 Other acute postprocedural pain: Secondary | ICD-10-CM | POA: Diagnosis not present

## 2017-02-02 DIAGNOSIS — Z888 Allergy status to other drugs, medicaments and biological substances status: Secondary | ICD-10-CM | POA: Diagnosis not present

## 2017-02-02 DIAGNOSIS — Z880 Allergy status to penicillin: Secondary | ICD-10-CM | POA: Diagnosis not present

## 2017-02-02 DIAGNOSIS — D696 Thrombocytopenia, unspecified: Secondary | ICD-10-CM | POA: Diagnosis not present

## 2017-02-02 DIAGNOSIS — Z96611 Presence of right artificial shoulder joint: Secondary | ICD-10-CM

## 2017-02-02 DIAGNOSIS — M19011 Primary osteoarthritis, right shoulder: Secondary | ICD-10-CM | POA: Diagnosis present

## 2017-02-02 DIAGNOSIS — Z87891 Personal history of nicotine dependence: Secondary | ICD-10-CM

## 2017-02-02 DIAGNOSIS — M069 Rheumatoid arthritis, unspecified: Secondary | ICD-10-CM | POA: Diagnosis not present

## 2017-02-02 DIAGNOSIS — Z79899 Other long term (current) drug therapy: Secondary | ICD-10-CM | POA: Diagnosis not present

## 2017-02-02 DIAGNOSIS — M75101 Unspecified rotator cuff tear or rupture of right shoulder, not specified as traumatic: Secondary | ICD-10-CM | POA: Diagnosis not present

## 2017-02-02 DIAGNOSIS — M06811 Other specified rheumatoid arthritis, right shoulder: Secondary | ICD-10-CM | POA: Diagnosis not present

## 2017-02-02 DIAGNOSIS — I129 Hypertensive chronic kidney disease with stage 1 through stage 4 chronic kidney disease, or unspecified chronic kidney disease: Secondary | ICD-10-CM | POA: Diagnosis present

## 2017-02-02 HISTORY — PX: REVERSE SHOULDER ARTHROPLASTY: SHX5054

## 2017-02-02 SURGERY — ARTHROPLASTY, SHOULDER, TOTAL, REVERSE
Anesthesia: General | Site: Shoulder | Laterality: Right

## 2017-02-02 MED ORDER — VITAMIN D 1000 UNITS PO TABS
1000.0000 [IU] | ORAL_TABLET | Freq: Every day | ORAL | Status: DC
  Administered 2017-02-02 – 2017-02-03 (×2): 1000 [IU] via ORAL
  Filled 2017-02-02 (×2): qty 1

## 2017-02-02 MED ORDER — PSEUDOEPHEDRINE-ACETAMINOPHEN 30-500 MG PO TABS: 1.0000 | ORAL_TABLET | Freq: Every day | ORAL | Status: DC | PRN

## 2017-02-02 MED ORDER — METOPROLOL SUCCINATE ER 50 MG PO TB24
50.0000 mg | ORAL_TABLET | Freq: Every morning | ORAL | Status: DC
  Administered 2017-02-02: 50 mg via ORAL
  Filled 2017-02-02 (×2): qty 1

## 2017-02-02 MED ORDER — LIDOCAINE 2% (20 MG/ML) 5 ML SYRINGE
INTRAMUSCULAR | Status: AC
  Filled 2017-02-02: qty 5

## 2017-02-02 MED ORDER — LORAZEPAM 2 MG/ML IJ SOLN: 1.0000 mg | Freq: Four times a day (QID) | INTRAMUSCULAR | Status: DC | PRN

## 2017-02-02 MED ORDER — IPRATROPIUM-ALBUTEROL 0.5-2.5 (3) MG/3ML IN SOLN
3.0000 mL | Freq: Two times a day (BID) | RESPIRATORY_TRACT | Status: DC
  Administered 2017-02-02 – 2017-02-03 (×2): 3 mL via RESPIRATORY_TRACT
  Filled 2017-02-02 (×2): qty 3

## 2017-02-02 MED ORDER — PREDNISONE 5 MG PO TABS
5.0000 mg | ORAL_TABLET | Freq: Every day | ORAL | Status: DC
  Administered 2017-02-02 – 2017-02-03 (×2): 5 mg via ORAL
  Filled 2017-02-02 (×2): qty 1

## 2017-02-02 MED ORDER — ONDANSETRON HCL 4 MG/2ML IJ SOLN: 4.0000 mg | Freq: Four times a day (QID) | INTRAMUSCULAR | Status: DC | PRN

## 2017-02-02 MED ORDER — ACETAMINOPHEN 500 MG PO TABS: 1000.0000 mg | ORAL_TABLET | Freq: Every day | ORAL | Status: DC | PRN

## 2017-02-02 MED ORDER — CHLORHEXIDINE GLUCONATE 4 % EX LIQD: 60.0000 mL | Freq: Once | CUTANEOUS | Status: DC

## 2017-02-02 MED ORDER — ADULT MULTIVITAMIN W/MINERALS CH
1.0000 | ORAL_TABLET | Freq: Every day | ORAL | Status: DC
  Administered 2017-02-02 – 2017-02-03 (×2): 1 via ORAL
  Filled 2017-02-02 (×2): qty 1

## 2017-02-02 MED ORDER — LORAZEPAM 1 MG PO TABS: 0.0000 mg | ORAL_TABLET | Freq: Two times a day (BID) | ORAL | Status: DC

## 2017-02-02 MED ORDER — BENEFIBER PO POWD: 1.0000 | Freq: Every day | ORAL | Status: DC

## 2017-02-02 MED ORDER — PHENOL 1.4 % MT LIQD: 1.0000 | OROMUCOSAL | Status: DC | PRN

## 2017-02-02 MED ORDER — TRAMADOL HCL 50 MG PO TABS
50.0000 mg | ORAL_TABLET | Freq: Four times a day (QID) | ORAL | Status: DC | PRN
  Administered 2017-02-02 – 2017-02-03 (×2): 50 mg via ORAL
  Filled 2017-02-02 (×3): qty 1

## 2017-02-02 MED ORDER — BUPIVACAINE HCL (PF) 0.25 % IJ SOLN
INTRAMUSCULAR | Status: AC
  Filled 2017-02-02: qty 30

## 2017-02-02 MED ORDER — SUGAMMADEX SODIUM 200 MG/2ML IV SOLN
INTRAVENOUS | Status: AC
  Filled 2017-02-02: qty 2

## 2017-02-02 MED ORDER — MIDAZOLAM HCL 2 MG/2ML IJ SOLN
INTRAMUSCULAR | Status: AC
  Administered 2017-02-02: 1 mg via INTRAVENOUS
  Filled 2017-02-02: qty 2

## 2017-02-02 MED ORDER — MIDAZOLAM HCL 2 MG/2ML IJ SOLN
1.0000 mg | Freq: Once | INTRAMUSCULAR | Status: AC
  Administered 2017-02-02: 1 mg via INTRAVENOUS

## 2017-02-02 MED ORDER — BIOTIN 1000 MCG PO TABS: 1000.0000 ug | ORAL_TABLET | Freq: Every day | ORAL | Status: DC

## 2017-02-02 MED ORDER — LIDOCAINE 2% (20 MG/ML) 5 ML SYRINGE
INTRAMUSCULAR | Status: DC | PRN
  Administered 2017-02-02: 60 mg via INTRAVENOUS

## 2017-02-02 MED ORDER — ROCURONIUM BROMIDE 50 MG/5ML IV SOSY
PREFILLED_SYRINGE | INTRAVENOUS | Status: AC
  Filled 2017-02-02: qty 5

## 2017-02-02 MED ORDER — PHENYLEPHRINE HCL 10 MG/ML IJ SOLN
INTRAMUSCULAR | Status: DC | PRN
  Administered 2017-02-02: 25 ug/min via INTRAVENOUS

## 2017-02-02 MED ORDER — METOCLOPRAMIDE HCL 5 MG/ML IJ SOLN: 5.0000 mg | Freq: Three times a day (TID) | INTRAMUSCULAR | Status: DC | PRN

## 2017-02-02 MED ORDER — MIDAZOLAM HCL 2 MG/2ML IJ SOLN
INTRAMUSCULAR | Status: AC
  Filled 2017-02-02: qty 2

## 2017-02-02 MED ORDER — FOLIC ACID 1 MG PO TABS
1.0000 mg | ORAL_TABLET | Freq: Every day | ORAL | Status: DC
  Administered 2017-02-02 – 2017-02-03 (×2): 1 mg via ORAL
  Filled 2017-02-02 (×2): qty 1

## 2017-02-02 MED ORDER — ONDANSETRON HCL 4 MG PO TABS: 4.0000 mg | ORAL_TABLET | Freq: Four times a day (QID) | ORAL | Status: DC | PRN

## 2017-02-02 MED ORDER — PROPOFOL 10 MG/ML IV BOLUS
INTRAVENOUS | Status: AC
  Filled 2017-02-02: qty 20

## 2017-02-02 MED ORDER — CLINDAMYCIN PHOSPHATE 600 MG/50ML IV SOLN
600.0000 mg | Freq: Four times a day (QID) | INTRAVENOUS | Status: AC
  Administered 2017-02-02 – 2017-02-03 (×3): 600 mg via INTRAVENOUS
  Filled 2017-02-02 (×3): qty 50

## 2017-02-02 MED ORDER — THIAMINE HCL 100 MG/ML IJ SOLN: 100.0000 mg | Freq: Every day | INTRAMUSCULAR | Status: DC

## 2017-02-02 MED ORDER — DOCUSATE SODIUM 100 MG PO CAPS
100.0000 mg | ORAL_CAPSULE | Freq: Two times a day (BID) | ORAL | Status: DC
  Administered 2017-02-02 – 2017-02-03 (×2): 100 mg via ORAL
  Filled 2017-02-02 (×2): qty 1

## 2017-02-02 MED ORDER — MENTHOL 3 MG MT LOZG: 1.0000 | LOZENGE | OROMUCOSAL | Status: DC | PRN

## 2017-02-02 MED ORDER — ROCURONIUM BROMIDE 10 MG/ML (PF) SYRINGE
PREFILLED_SYRINGE | INTRAVENOUS | Status: DC | PRN
  Administered 2017-02-02: 25 mg via INTRAVENOUS

## 2017-02-02 MED ORDER — OXYCODONE HCL 5 MG/5ML PO SOLN: 5.0000 mg | Freq: Once | ORAL | Status: DC | PRN

## 2017-02-02 MED ORDER — IRBESARTAN 150 MG PO TABS
75.0000 mg | ORAL_TABLET | Freq: Every day | ORAL | Status: DC
  Administered 2017-02-02 – 2017-02-03 (×2): 75 mg via ORAL
  Filled 2017-02-02 (×2): qty 1

## 2017-02-02 MED ORDER — EPINEPHRINE PF 1 MG/ML IJ SOLN
INTRAMUSCULAR | Status: AC
  Filled 2017-02-02: qty 1

## 2017-02-02 MED ORDER — ETANERCEPT 50 MG/ML ~~LOC~~ SOAJ: 50.0000 mg | SUBCUTANEOUS | Status: DC

## 2017-02-02 MED ORDER — HYDROCODONE-ACETAMINOPHEN 5-325 MG PO TABS: 0.5000 | ORAL_TABLET | Freq: Four times a day (QID) | ORAL | 0 refills | Status: DC | PRN

## 2017-02-02 MED ORDER — B COMPLEX-C PO TABS
1.0000 | ORAL_TABLET | Freq: Every day | ORAL | Status: DC
  Filled 2017-02-02: qty 1

## 2017-02-02 MED ORDER — GABAPENTIN 300 MG PO CAPS
300.0000 mg | ORAL_CAPSULE | Freq: Two times a day (BID) | ORAL | Status: DC
  Administered 2017-02-02 – 2017-02-03 (×2): 300 mg via ORAL
  Filled 2017-02-02 (×2): qty 1

## 2017-02-02 MED ORDER — ONDANSETRON HCL 4 MG/2ML IJ SOLN
INTRAMUSCULAR | Status: AC
  Filled 2017-02-02: qty 2

## 2017-02-02 MED ORDER — PROPOFOL 10 MG/ML IV BOLUS
INTRAVENOUS | Status: DC | PRN
  Administered 2017-02-02: 130 mg via INTRAVENOUS

## 2017-02-02 MED ORDER — TRAMADOL HCL 50 MG PO TABS: 50.0000 mg | ORAL_TABLET | Freq: Four times a day (QID) | ORAL | 0 refills | Status: DC | PRN

## 2017-02-02 MED ORDER — 0.9 % SODIUM CHLORIDE (POUR BTL) OPTIME
TOPICAL | Status: DC | PRN
  Administered 2017-02-02: 1000 mL

## 2017-02-02 MED ORDER — FENTANYL CITRATE (PF) 100 MCG/2ML IJ SOLN
INTRAMUSCULAR | Status: AC
  Administered 2017-02-02: 50 ug via INTRAVENOUS
  Filled 2017-02-02: qty 2

## 2017-02-02 MED ORDER — LORATADINE 10 MG PO TABS
5.0000 mg | ORAL_TABLET | Freq: Every day | ORAL | Status: DC
  Administered 2017-02-02 – 2017-02-03 (×2): 5 mg via ORAL
  Filled 2017-02-02 (×2): qty 1

## 2017-02-02 MED ORDER — IPRATROPIUM-ALBUTEROL 20-100 MCG/ACT IN AERS: 2.0000 | INHALATION_SPRAY | Freq: Two times a day (BID) | RESPIRATORY_TRACT | Status: DC

## 2017-02-02 MED ORDER — LORAZEPAM 1 MG PO TABS
0.0000 mg | ORAL_TABLET | Freq: Four times a day (QID) | ORAL | Status: DC
Start: 2017-02-02 — End: 2017-02-03
  Filled 2017-02-02: qty 2

## 2017-02-02 MED ORDER — ACETAMINOPHEN 650 MG RE SUPP: 650.0000 mg | Freq: Four times a day (QID) | RECTAL | Status: DC | PRN

## 2017-02-02 MED ORDER — VITAMIN B-1 100 MG PO TABS
100.0000 mg | ORAL_TABLET | Freq: Every day | ORAL | Status: DC
  Administered 2017-02-02: 100 mg via ORAL
  Filled 2017-02-02: qty 1

## 2017-02-02 MED ORDER — LORAZEPAM 1 MG PO TABS
1.0000 mg | ORAL_TABLET | Freq: Four times a day (QID) | ORAL | Status: DC | PRN
  Administered 2017-02-03: 1 mg via ORAL
  Filled 2017-02-02: qty 1

## 2017-02-02 MED ORDER — TURMERIC 500 MG PO CAPS: 47.5000 mg | ORAL_CAPSULE | Freq: Every day | ORAL | Status: DC

## 2017-02-02 MED ORDER — FENTANYL CITRATE (PF) 100 MCG/2ML IJ SOLN
INTRAMUSCULAR | Status: AC
  Filled 2017-02-02: qty 2

## 2017-02-02 MED ORDER — HYDROCODONE-ACETAMINOPHEN 5-325 MG PO TABS
0.5000 | ORAL_TABLET | ORAL | Status: DC | PRN
  Administered 2017-02-02 – 2017-02-03 (×4): 1 via ORAL
  Filled 2017-02-02 (×4): qty 1

## 2017-02-02 MED ORDER — SUGAMMADEX SODIUM 200 MG/2ML IV SOLN
INTRAVENOUS | Status: DC | PRN
  Administered 2017-02-02: 120 mg via INTRAVENOUS

## 2017-02-02 MED ORDER — LACTATED RINGERS IV SOLN
INTRAVENOUS | Status: DC
  Administered 2017-02-02: 08:00:00 via INTRAVENOUS

## 2017-02-02 MED ORDER — ONDANSETRON HCL 4 MG/2ML IJ SOLN
INTRAMUSCULAR | Status: DC | PRN
  Administered 2017-02-02: 4 mg via INTRAVENOUS

## 2017-02-02 MED ORDER — BUPIVACAINE-EPINEPHRINE 0.25% -1:200000 IJ SOLN
INTRAMUSCULAR | Status: DC | PRN
  Administered 2017-02-02: 6 mL

## 2017-02-02 MED ORDER — FENTANYL CITRATE (PF) 100 MCG/2ML IJ SOLN
50.0000 ug | Freq: Once | INTRAMUSCULAR | Status: AC
  Administered 2017-02-02: 50 ug via INTRAVENOUS

## 2017-02-02 MED ORDER — METOCLOPRAMIDE HCL 5 MG PO TABS: 5.0000 mg | ORAL_TABLET | Freq: Three times a day (TID) | ORAL | Status: DC | PRN

## 2017-02-02 MED ORDER — FENTANYL CITRATE (PF) 100 MCG/2ML IJ SOLN: 25.0000 ug | INTRAMUSCULAR | Status: DC | PRN

## 2017-02-02 MED ORDER — OXYCODONE HCL 5 MG PO TABS: 5.0000 mg | ORAL_TABLET | Freq: Once | ORAL | Status: DC | PRN

## 2017-02-02 MED ORDER — ACETAMINOPHEN 325 MG PO TABS: 650.0000 mg | ORAL_TABLET | Freq: Four times a day (QID) | ORAL | Status: DC | PRN

## 2017-02-02 MED ORDER — SODIUM CHLORIDE 0.9 % IV SOLN
INTRAVENOUS | Status: DC
  Administered 2017-02-02: 13:00:00 via INTRAVENOUS

## 2017-02-02 MED ORDER — BUPIVACAINE-EPINEPHRINE (PF) 0.5% -1:200000 IJ SOLN
INTRAMUSCULAR | Status: DC | PRN
  Administered 2017-02-02: 30 mL via PERINEURAL

## 2017-02-02 MED ORDER — POLYETHYLENE GLYCOL 3350 17 G PO PACK: 17.0000 g | PACK | Freq: Every day | ORAL | Status: DC | PRN

## 2017-02-02 SURGICAL SUPPLY — 65 items
BIT DRILL 5/64X5 DISP (BIT) IMPLANT
BLADE SAG 18X100X1.27 (BLADE) ×2 IMPLANT
BOWL SMART MIX CTS (DISPOSABLE) ×2 IMPLANT
CAPT SHLDR REVTOTAL 1 ×2 IMPLANT
CEMENT HV SMART SET (Cement) ×2 IMPLANT
COVER SURGICAL LIGHT HANDLE (MISCELLANEOUS) ×2 IMPLANT
DRAPE IMP U-DRAPE 54X76 (DRAPES) ×4 IMPLANT
DRAPE INCISE IOBAN 66X45 STRL (DRAPES) ×2 IMPLANT
DRAPE ORTHO SPLIT 77X108 STRL (DRAPES) ×2
DRAPE SURG ORHT 6 SPLT 77X108 (DRAPES) ×2 IMPLANT
DRAPE U-SHAPE 47X51 STRL (DRAPES) ×2 IMPLANT
DRAPE X-RAY CASS 24X20 (DRAPES) IMPLANT
DRSG ADAPTIC 3X8 NADH LF (GAUZE/BANDAGES/DRESSINGS) ×2 IMPLANT
DRSG PAD ABDOMINAL 8X10 ST (GAUZE/BANDAGES/DRESSINGS) ×2 IMPLANT
DURAPREP 26ML APPLICATOR (WOUND CARE) ×2 IMPLANT
ELECT BLADE 4.0 EZ CLEAN MEGAD (MISCELLANEOUS) ×2
ELECT NEEDLE TIP 2.8 STRL (NEEDLE) ×2 IMPLANT
ELECT REM PT RETURN 9FT ADLT (ELECTROSURGICAL) ×2
ELECTRODE BLDE 4.0 EZ CLN MEGD (MISCELLANEOUS) ×1 IMPLANT
ELECTRODE REM PT RTRN 9FT ADLT (ELECTROSURGICAL) ×1 IMPLANT
FILTER STRAW FLUID ASPIR (MISCELLANEOUS) ×2 IMPLANT
GAUZE SPONGE 4X4 12PLY STRL (GAUZE/BANDAGES/DRESSINGS) ×2 IMPLANT
GLOVE BIOGEL PI ORTHO PRO 7.5 (GLOVE) ×1
GLOVE BIOGEL PI ORTHO PRO SZ8 (GLOVE) ×1
GLOVE ORTHO TXT STRL SZ7.5 (GLOVE) ×2 IMPLANT
GLOVE PI ORTHO PRO STRL 7.5 (GLOVE) ×1 IMPLANT
GLOVE PI ORTHO PRO STRL SZ8 (GLOVE) ×1 IMPLANT
GLOVE SURG ORTHO 8.5 STRL (GLOVE) ×2 IMPLANT
GOWN STRL REUS W/ TWL LRG LVL3 (GOWN DISPOSABLE) ×1 IMPLANT
GOWN STRL REUS W/ TWL XL LVL3 (GOWN DISPOSABLE) ×2 IMPLANT
GOWN STRL REUS W/TWL LRG LVL3 (GOWN DISPOSABLE) ×1
GOWN STRL REUS W/TWL XL LVL3 (GOWN DISPOSABLE) ×2
HANDPIECE INTERPULSE COAX TIP (DISPOSABLE)
KIT BASIN OR (CUSTOM PROCEDURE TRAY) ×2 IMPLANT
KIT ROOM TURNOVER OR (KITS) ×2 IMPLANT
MANIFOLD NEPTUNE II (INSTRUMENTS) ×2 IMPLANT
NEEDLE 1/2 CIR MAYO (NEEDLE) ×2 IMPLANT
NEEDLE 25GX 5/8IN NON SAFETY (NEEDLE) ×2 IMPLANT
NEEDLE HYPO 25GX1X1/2 BEV (NEEDLE) ×2 IMPLANT
NS IRRIG 1000ML POUR BTL (IV SOLUTION) ×2 IMPLANT
PACK SHOULDER (CUSTOM PROCEDURE TRAY) ×2 IMPLANT
PAD ARMBOARD 7.5X6 YLW CONV (MISCELLANEOUS) ×4 IMPLANT
SET HNDPC FAN SPRY TIP SCT (DISPOSABLE) IMPLANT
SLING ARM LRG ADULT FOAM STRAP (SOFTGOODS) ×2 IMPLANT
SLING ARM MED ADULT FOAM STRAP (SOFTGOODS) IMPLANT
SPONGE LAP 18X18 X RAY DECT (DISPOSABLE) ×2 IMPLANT
SPONGE LAP 4X18 X RAY DECT (DISPOSABLE) ×2 IMPLANT
STRIP CLOSURE SKIN 1/2X4 (GAUZE/BANDAGES/DRESSINGS) ×2 IMPLANT
SUCTION FRAZIER HANDLE 10FR (MISCELLANEOUS) ×1
SUCTION TUBE FRAZIER 10FR DISP (MISCELLANEOUS) ×1 IMPLANT
SUT FIBERWIRE #2 38 T-5 BLUE (SUTURE) ×4
SUT MNCRL AB 4-0 PS2 18 (SUTURE) ×2 IMPLANT
SUT VIC AB 0 CT2 27 (SUTURE) ×2 IMPLANT
SUT VIC AB 2-0 CT1 27 (SUTURE) ×1
SUT VIC AB 2-0 CT1 TAPERPNT 27 (SUTURE) ×1 IMPLANT
SUT VICRYL 0 CT 1 36IN (SUTURE) ×2 IMPLANT
SUTURE FIBERWR #2 38 T-5 BLUE (SUTURE) ×2 IMPLANT
SYR CONTROL 10ML LL (SYRINGE) ×2 IMPLANT
SYR TB 1ML LUER SLIP (SYRINGE) ×2 IMPLANT
TOWEL OR 17X24 6PK STRL BLUE (TOWEL DISPOSABLE) ×2 IMPLANT
TOWEL OR 17X26 10 PK STRL BLUE (TOWEL DISPOSABLE) ×2 IMPLANT
TOWER CARTRIDGE SMART MIX (DISPOSABLE) IMPLANT
TRAY FOLEY CATH 16FRSI W/METER (SET/KITS/TRAYS/PACK) IMPLANT
WATER STERILE IRR 1000ML POUR (IV SOLUTION) ×2 IMPLANT
YANKAUER SUCT BULB TIP NO VENT (SUCTIONS) ×2 IMPLANT

## 2017-02-02 NOTE — Brief Op Note (Signed)
02/02/2017  11:30 AM  PATIENT:  [name]  71 y.o. female  PRE-OPERATIVE DIAGNOSIS:  Right shoulder Rheumatoid Arthritis, rotator cuff tear  POST-OPERATIVE DIAGNOSIS:  Right shoulder Rheumatoid Arthritis, rotator cuff tear  PROCEDURE:  Procedure(s): RIGHT REVERSE SHOULDER ARTHROPLASTY (Right)  DePuy Delta Xtend SURGEON:  Surgeon(s) and Role:    * Netta Cedars, MD - Primary  PHYSICIAN ASSISTANT:   ASSISTANTS: Ventura Bruns, PA-C   ANESTHESIA:   regional and general  EBL:  Total I/O In: 750 [I.V.:750] Out: 50 [Blood:50]  BLOOD ADMINISTERED:none  DRAINS: none   LOCAL MEDICATIONS USED:  MARCAINE     SPECIMEN:  No Specimen  DISPOSITION OF SPECIMEN:  N/A  COUNTS:  YES  TOURNIQUET:  * No tourniquets in log *  DICTATION: .Other Dictation: Dictation Number [id number]  PLAN OF CARE: Admit to inpatient   PATIENT DISPOSITION:  PACU - hemodynamically stable.   Delay start of Pharmacological VTE agent (>24hrs) due to surgical blood loss or risk of bleeding: yes

## 2017-02-02 NOTE — Discharge Instructions (Signed)
Ice to the shoulder as much as you can.  Keep the incision covered and clean and dry for one week, then ok to get it wet in the shower.  Use sling as you need to for support of the right arm.  Please get help getting up as your balance will be off.   Ok to use the right arm for very gentle activity - hand to face.  No push pull or lift with the arm.  Keep a pillow propped behind the right arm to keep the arm resting across your abdomen.  Follow up in two weeks in the office. [phone]

## 2017-02-02 NOTE — Anesthesia Postprocedure Evaluation (Addendum)
Anesthesia Post Note  Patient: [name]  Procedure(s) Performed: Procedure(s) (LRB): RIGHT REVERSE SHOULDER ARTHROPLASTY (Right)  Patient location during evaluation: PACU Anesthesia Type: General Level of consciousness: awake and alert and patient cooperative Pain management: pain level controlled Vital Signs Assessment: post-procedure vital signs reviewed and stable Respiratory status: spontaneous breathing and respiratory function stable Cardiovascular status: stable Anesthetic complications: no       Last Vitals:  Vitals:   02/02/17 0821 02/02/17 1130  BP: (!) 171/63   Pulse: (!) 50   Resp: 20   Temp: 36.6 C 36.2 C    Last Pain:  Vitals:   02/02/17 1130  TempSrc:   PainSc: 0-No pain                 Avinash Maltos S

## 2017-02-02 NOTE — Transfer of Care (Signed)
Immediate Anesthesia Transfer of Care Note  Patient: [name]  Procedure(s) Performed: Procedure(s): RIGHT REVERSE SHOULDER ARTHROPLASTY (Right)  Patient Location: PACU  Anesthesia Type:GA combined with regional for post-op pain  Level of Consciousness: awake, alert , oriented and patient cooperative  Airway & Oxygen Therapy: Patient Spontanous Breathing  Post-op Assessment: Report given to RN, Post -op Vital signs reviewed and stable and Patient moving all extremities  Post vital signs: Reviewed and stable  Last Vitals:  Vitals:   02/02/17 0821  BP: (!) 171/63  Pulse: (!) 50  Resp: 20  Temp: 36.6 C    Last Pain:  Vitals:   02/02/17 0822  TempSrc:   PainSc: 5       Patients Stated Pain Goal: 5 (79/49/97 1820)  Complications: No apparent anesthesia complications

## 2017-02-02 NOTE — Progress Notes (Signed)
PHARMACIST - PHYSICIAN ORDER COMMUNICATION  CONCERNING: P&T Medication Policy on Herbal Medications  DESCRIPTION:  This patient's order for:  Biotin + Tumeric  has been noted.  This product(s) is classified as an "herbal" or natural product. Due to a lack of definitive safety studies or FDA approval, nonstandard manufacturing practices, plus the potential risk of unknown drug-drug interactions while on inpatient medications, the Pharmacy and Therapeutics Committee does not permit the use of "herbal" or natural products of this type within [facility].   ACTION TAKEN: The pharmacy department is unable to verify this order at this time and your patient has been informed of this safety policy. Please reevaluate patient's clinical condition at discharge and address if the herbal or natural product(s) should be resumed at that time.  Thank you for allowing pharmacy to be a part of this patient's care.  Alycia Rossetti, PharmD, BCPS Clinical Pharmacist 02/02/2017 1:20 PM

## 2017-02-02 NOTE — Anesthesia Procedure Notes (Signed)
Anesthesia Regional Block:  Interscalene brachial plexus block  Pre-Anesthetic Checklist: ,, timeout performed, Correct Patient, Correct Site, Correct Laterality, Correct Procedure, Correct Position, site marked, Risks and benefits discussed,  Surgical consent,  Pre-op evaluation,  At surgeon's request and post-op pain management  Laterality: Right  Prep: chloraprep       Needles:  Injection technique: Single-shot  Needle Type: Echogenic Stimulator Needle     Needle Length: 5cm 5 cm Needle Gauge: 22 and 22 G    Additional Needles:  Procedures: ultrasound guided (picture in chart) and nerve stimulator Interscalene brachial plexus block  Nerve Stimulator or Paresthesia:  Response: biceps flexion, 0.45 mA,   Additional Responses:   Narrative:  Start time: 02/02/2017 8:51 AM End time: 02/02/2017 8:57 AM Injection made incrementally with aspirations every 5 mL.  Performed by: Personally  Anesthesiologist: Brylin Stopper  Additional Notes: Functioning IV was confirmed and monitors were applied.  A 20m 22ga Arrow echogenic stimulator needle was used. Sterile prep and drape,hand hygiene and sterile gloves were used.  Negative aspiration and negative test dose prior to incremental administration of local anesthetic. The patient tolerated the procedure well.  Ultrasound guidance: relevent anatomy identified, needle position confirmed, local anesthetic spread visualized around nerve(s), vascular puncture avoided.  Image printed for medical record.

## 2017-02-02 NOTE — Anesthesia Preprocedure Evaluation (Addendum)
Anesthesia Evaluation  Patient identified by MRN, date of birth, ID band Patient awake    Reviewed: Allergy & Precautions, H&P , NPO status , Patient's Chart, lab work & pertinent test results  Airway Mallampati: II   Neck ROM: full    Dental  (+) Teeth Intact, Dental Advisory Given   Pulmonary shortness of breath, former smoker,    breath sounds clear to auscultation       Cardiovascular hypertension, + Peripheral Vascular Disease   Rhythm:regular Rate:Normal     Neuro/Psych  Headaches,    GI/Hepatic   Endo/Other    Renal/GU Renal InsufficiencyRenal disease     Musculoskeletal  (+) Arthritis ,   Abdominal   Peds  Hematology  (+) anemia ,   Anesthesia Other Findings   Reproductive/Obstetrics                            Anesthesia Physical Anesthesia Plan  ASA: III  Anesthesia Plan: General   Post-op Pain Management:  Regional for Post-op pain   Induction: Intravenous  Airway Management Planned: Oral ETT  Additional Equipment:   Intra-op Plan:   Post-operative Plan:   Informed Consent: I have reviewed the patients History and Physical, chart, labs and discussed the procedure including the risks, benefits and alternatives for the proposed anesthesia with the patient or authorized representative who has indicated his/her understanding and acceptance.   Dental advisory given  Plan Discussed with: CRNA, Anesthesiologist and Surgeon  Anesthesia Plan Comments:        Anesthesia Quick Evaluation

## 2017-02-02 NOTE — Interval H&P Note (Signed)
History and Physical Interval Note:  02/02/2017 9:14 AM  [name]  has presented today for surgery, with the diagnosis of Right shoulder Rheumatoid Arthritis, rotator cuff tear  The various methods of treatment have been discussed with the patient and family. After consideration of risks, benefits and other options for treatment, the patient has consented to  Procedure(s): RIGHT REVERSE SHOULDER ARTHROPLASTY (Right) as a surgical intervention .  The patient's history has been reviewed, patient examined, no change in status, stable for surgery.  I have reviewed the patient's chart and labs.  Questions were answered to the patient's satisfaction.     [name],STEVEN R

## 2017-02-02 NOTE — Anesthesia Procedure Notes (Signed)
Procedure Name: Intubation Date/Time: 02/02/2017 9:55 AM Performed by: Julieta Bellini Pre-anesthesia Checklist: Patient identified, Emergency Drugs available, Suction available and Patient being monitored Patient Re-evaluated:Patient Re-evaluated prior to inductionOxygen Delivery Method: Circle system utilized Preoxygenation: Pre-oxygenation with 100% oxygen Intubation Type: IV induction Ventilation: Mask ventilation without difficulty Laryngoscope Size: Mac and 3 Grade View: Grade I Tube type: Oral Tube size: 7.0 mm Number of attempts: 1 Airway Equipment and Method: Stylet Placement Confirmation: ETT inserted through vocal cords under direct vision,  positive ETCO2 and breath sounds checked- equal and bilateral Secured at: 22 cm Tube secured with: Tape Dental Injury: Teeth and Oropharynx as per pre-operative assessment

## 2017-02-03 LAB — BASIC METABOLIC PANEL
Anion gap: 10 (ref 5–15)
BUN: 14 mg/dL (ref 6–20)
CO2: 26 mmol/L (ref 22–32)
Calcium: 8.6 mg/dL — ABNORMAL LOW (ref 8.9–10.3)
Chloride: 99 mmol/L — ABNORMAL LOW (ref 101–111)
Creatinine, Ser: 1.05 mg/dL — ABNORMAL HIGH (ref 0.44–1.00)
GFR calc Af Amer: >60 mL/min (ref >60)
GFR calc non Af Amer: 53 mL/min — ABNORMAL LOW (ref >60)
Glucose, Bld: 109 mg/dL — ABNORMAL HIGH (ref 65–99)
Potassium: 3.7 mmol/L (ref 3.5–5.1)
Sodium: 135 mmol/L (ref 135–145)

## 2017-02-03 LAB — HEMOGLOBIN AND HEMATOCRIT, BLOOD
HCT: 27.2 % — ABNORMAL LOW (ref 36.0–46.0)
Hemoglobin: 8.5 g/dL — ABNORMAL LOW (ref 12.0–15.0)

## 2017-02-03 NOTE — Evaluation (Signed)
Occupational Therapy Evaluation and Discharge Patient Details Name: [name] MRN: [id number] DOB: 07/20/1946 Today's Date: 02/03/2017    History of Present Illness Pt is a 71 y/o female s/p RIGHT REVERSE SHOULDER ARTHROPLASTY. Pt has a past medical history including Arthritis; Celiac disease; Chronic kidney disease; Dyspnea; Hypertension; and Iron deficiency anemia.    Clinical Impression   PTA Pt independent in ADL and mobility. Pt currently max A for BUE ADL and min guard for mobility. Pt's husband and caregiver received all education completely reviewing the entire OT shoulder handout and performing HEP as ordered by MD. Pt's husband able to don/doff brace independently. No questions or concerns at the end of the session. OT to sign off at this time, thank you for this referral.     Follow Up Recommendations  No OT follow up;Supervision/Assistance - 24 hour (progress rehab of shoulder as ordered by MD at follow up)    Equipment Recommendations  None recommended by OT    Recommendations for Other Services       Precautions / Restrictions Precautions Precautions: Shoulder Type of Shoulder Precautions: Conservative Protocol Shoulder Interventions: Shoulder sling/immobilizer;At all times;Off for dressing/bathing/exercises Precaution Booklet Issued: Yes (comment) Precaution Comments: OT shoulder handout reviewed in full Required Braces or Orthoses: Sling Restrictions Weight Bearing Restrictions: Yes RUE Weight Bearing: Non weight bearing Other Position/Activity Restrictions: Gentle ADL OK      Mobility Bed Mobility Overal bed mobility: Needs Assistance Bed Mobility: Supine to Sit     Supine to sit: Min guard;HOB elevated     General bed mobility comments: min guard for safety  Transfers Overall transfer level: Needs assistance Equipment used: None Transfers: Sit to/from Stand Sit to Stand: Min guard         General transfer comment: for saftey - no  assist needed    Balance Overall balance assessment: No apparent balance deficits (not formally assessed)                                          ADL Overall ADL's : Needs assistance/impaired                         Toilet Transfer: Min guard;Ambulation;Comfort height toilet Toilet Transfer Details (indicate cue type and reason): simulated in room   Toileting - Clothing Manipulation Details (indicate cue type and reason): Talked to Pt and husband about smart clothing choices for toileting once they get home     Functional mobility during ADLs: Min guard General ADL Comments: Pt's husband willing and able to assist with ADL as needed     Vision Vision Assessment?: No apparent visual deficits   Perception     Praxis      Pertinent Vitals/Pain Pain Assessment: 0-10 Pain Score: 5  Pain Location: R shoulder Pain Descriptors / Indicators: Grimacing;Guarding;Sore;Moaning Pain Intervention(s): Limited activity within patient's tolerance;Monitored during session;Repositioned;Ice applied     Hand Dominance Right   Extremity/Trunk Assessment Upper Extremity Assessment Upper Extremity Assessment: RUE deficits/detail RUE Deficits / Details: deficits in strength and ROM post-op RUE Sensation:  (Pt states could not feel elbow)   Lower Extremity Assessment Lower Extremity Assessment: Overall WFL for tasks assessed;Generalized weakness       Communication Communication Communication: No difficulties   Cognition Arousal/Alertness: Suspect due to medications (very sleepy, but WFL) Behavior During Therapy: WFL for tasks assessed/performed  Overall Cognitive Status: Within Functional Limits for tasks assessed                     General Comments       Exercises Exercises: Shoulder     Shoulder Instructions Shoulder Instructions Donning/doffing shirt without moving shoulder: Maximal assistance;Caregiver independent with task;Patient able to  independently direct caregiver Method for sponge bathing under operated UE: Modified independent Donning/doffing sling/immobilizer: Maximal assistance;Caregiver independent with task;Patient able to independently direct caregiver Correct positioning of sling/immobilizer: Moderate assistance;Caregiver independent with task ROM for elbow, wrist and digits of operated UE: Modified independent Sling wearing schedule (on at all times/off for ADL's): Modified independent Proper positioning of operated UE when showering: Minimal assistance;Caregiver independent with task Positioning of UE while sleeping: Moderate assistance;Caregiver independent with task;Patient able to independently direct caregiver (for positioning of pillows)    Home Living Family/patient expects to be discharged to:: Private residence Living Arrangements: Spouse/significant other Available Help at Discharge: Family;Available 24 hours/day Type of Home: Other(Comment) (townhome) Home Access: Stairs to enter [employer] of Steps: 3 Entrance Stairs-Rails: None Home Layout: Two level;Able to live on main level with bedroom/bathroom Alternate Level Stairs-Number of Steps: flight   Bathroom Shower/Tub: Walk-in shower   Bathroom Toilet: Handicapped height     Home Equipment: Environmental consultant - 2 wheels;Cane - single point;Shower seat - built in;Hand held shower head          Prior Functioning/Environment Level of Independence: Independent                 OT Problem List: Decreased activity tolerance;Decreased range of motion;Decreased strength;Impaired UE functional use;Decreased safety awareness;Pain   OT Treatment/Interventions:      OT Goals(Current goals can be found in the care plan section) Acute Rehab OT Goals Patient Stated Goal: to get back to getting out more OT Goal Formulation: With patient Time For Goal Achievement: 02/10/17 Potential to Achieve Goals: Good  OT Frequency:     Barriers to D/C:             Co-evaluation              End of Session Equipment Utilized During Treatment: Other (comment) (sling) Nurse Communication: Mobility status;Other (comment);Weight bearing status (OT education complete)  Activity Tolerance: Patient tolerated treatment well Patient left: in bed;with call bell/phone within reach;with family/visitor present   Time: 4163-8453 OT Time Calculation (min): 45 min Charges:  OT General Charges $OT Visit: 1 Procedure OT Evaluation $OT Eval Moderate Complexity: 1 Procedure OT Treatments $Self Care/Home Management : 8-22 mins $Therapeutic Exercise: 8-22 mins G-Codes:    Merri Ray Tzipporah Nagorski 02/23/2017, 10:48 AM  Hulda Humphrey OTR/L [phone]

## 2017-02-03 NOTE — Progress Notes (Signed)
Subjective: 1 Day Post-Op Procedure(s) (LRB): RIGHT REVERSE SHOULDER ARTHROPLASTY (Right) Patient reports pain as mild.  No numbness or tingling. Norco seems to be more effective than Ultram for pain for her with both that they have tried overnight. No nausea or other c/o. Would like to go home today and feels she will be able.  Objective: Vital signs in last 24 hours: Temp:  [97.2 F (36.2 C)-99.3 F (37.4 C)] 98.8 F (37.1 C) (02/10 0458) Pulse Rate:  [76-114] 81 (02/10 0458) Resp:  [16-18] 18 (02/09 2043) BP: (104-146)/(46-64) 104/46 (02/10 0458) SpO2:  [95 %-99 %] 95 % (02/10 0458)  Intake/Output from previous day: 02/09 0701 - 02/10 0700 In: 985.8 [I.V.:935.8; IV Piggyback:50] Out: 350 [Urine:300; Blood:50] Intake/Output this shift: Total I/O In: 240 [P.O.:240] Out: -    Recent Labs  02/03/17 0533  HGB 8.5*    Recent Labs  02/03/17 0533  HCT 27.2*    Recent Labs  02/03/17 0533  NA 135  K 3.7  CL 99*  CO2 26  BUN 14  CREATININE 1.05*  GLUCOSE 109*  CALCIUM 8.6*   No results for input(s): LABPT, INR in the last 72 hours.  Neurologically intact ABD soft Neurovascular intact Sensation intact distally Intact pulses distally Dorsiflexion/Plantar flexion intact Incision: dressing C/D/I and no drainage No cellulitis present Compartment soft no sign of DVT  Assessment/Plan: 1 Day Post-Op Procedure(s) (LRB): RIGHT REVERSE SHOULDER ARTHROPLASTY (Right) Advance diet Up with therapy D/C IV fluids  Plan D/C home today after PT/OT with Rx for Norco and Ultram Dressing change prior to D/C Follow up as outpt as instructed with Dr. Veverly Fells Please call my cell at [phone] if pt not ready for D/C today  Jaimon Bugaj M. 02/03/2017, 8:37 AM

## 2017-02-03 NOTE — Discharge Summary (Signed)
Patient ID: [name] MRN: [id number] DOB/AGE: September 23, 1946 71 y.o.  Admit date: 02/02/2017 Discharge date: 02/03/2017  Admission Diagnoses:  Active Problems:   S/P shoulder replacement, right   Discharge Diagnoses:  Same  Past Medical History:  Diagnosis Date  . Arthritis    Rheumatoid arthritis  . Celiac disease   . Chronic kidney disease    stage 3 from MD notes  . Dyspnea    with going up stairs  . Family history of adverse reaction to anesthesia    father had hard time waking up  . Headache    sinus headaches  . Hot flashes   . Hypertension   . Iron deficiency anemia     Surgeries: Procedure(s): RIGHT REVERSE SHOULDER ARTHROPLASTY on 02/02/2017   Consultants:   Discharged Condition: Improved  Hospital Course: [name] is an 71 y.o. female who was admitted 02/02/2017 for operative treatment of<principal problem not specified>. Patient has severe unremitting pain that affects sleep, daily activities, and work/hobbies. After pre-op clearance the patient was taken to the operating room on 02/02/2017 and underwent  Procedure(s): RIGHT REVERSE SHOULDER ARTHROPLASTY.    Patient was given perioperative antibiotics: Anti-infectives    Start     Dose/Rate Route Frequency Ordered Stop   02/02/17 1600  clindamycin (CLEOCIN) IVPB 600 mg     600 mg 100 mL/hr over 30 Minutes Intravenous Every 6 hours 02/02/17 1310 02/03/17 0332   02/02/17 0930  clindamycin (CLEOCIN) IVPB 900 mg     900 mg 100 mL/hr over 30 Minutes Intravenous To ShortStay Surgical 02/01/17 0931 02/02/17 1006       Patient was given sequential compression devices, early ambulation, and chemoprophylaxis to prevent DVT.  Patient benefited maximally from hospital stay and there were no complications.    Recent vital signs: Patient Vitals for the past 24 hrs:  BP Temp Temp src Pulse Resp SpO2  02/03/17 0458 (!) 104/46 98.8 F (37.1 C) Oral 81 - 95 %  02/03/17 0029 (!) 121/59 98.8 F (37.1 C) Oral  79 - 97 %  02/02/17 2126 - - - - - 95 %  02/02/17 2043 (!) 112/54 99.3 F (37.4 C) Oral (!) 114 18 95 %  02/02/17 1700 140/62 - - 76 - -  02/02/17 1300 (!) 146/62 97.5 F (36.4 C) - 79 16 99 %  02/02/17 1230 (!) 146/64 - - - - -  02/02/17 1130 - 97.2 F (36.2 C) - - - -     Recent laboratory studies:  Recent Labs  02/03/17 0533  HGB 8.5*  HCT 27.2*  NA 135  K 3.7  CL 99*  CO2 26  BUN 14  CREATININE 1.05*  GLUCOSE 109*  CALCIUM 8.6*     Discharge Medications:   Allergies as of 02/03/2017      Reactions   Gluten Meal Diarrhea   Penicillins Rash   Has patient had a PCN reaction causing immediate rash, facial/tongue/throat swelling, SOB or lightheadedness with hypotension: Yes Has patient had a PCN reaction causing severe rash involving mucus membranes or skin necrosis: No Has patient had a PCN reaction that required hospitalization No Has patient had a PCN reaction occurring within the last 10 years: No If all of the above answers are "NO", then may proceed with Cephalosporin use.   Alendronate Rash   Hydroxychloroquine Itching      Medication List    TAKE these medications   acetaminophen 500 MG tablet Commonly known as:  TYLENOL Take  1,000 mg by mouth daily as needed for mild pain.   B-complex with vitamin C tablet Take 1 tablet by mouth daily.   BENEFIBER Powd Take 1 Dose by mouth daily.   Biotin 1000 MCG tablet Take 1,000 mcg by mouth daily.   cholecalciferol 1000 units tablet Commonly known as:  VITAMIN D Take 1,000 Units by mouth daily.   COMBIVENT RESPIMAT 20-100 MCG/ACT Aers respimat Generic drug:  Ipratropium-Albuterol Inhale 2 puffs into the lungs 2 (two) times daily.   ENBREL SURECLICK 50 MG/ML injection Generic drug:  etanercept Inject 50 mg into the skin every Friday.   gabapentin 300 MG capsule Commonly known as:  NEURONTIN Take 300 mg by mouth 2 (two) times daily.   HYDROcodone-acetaminophen 5-325 MG tablet Commonly known as:   NORCO Take 0.5-1 tablets by mouth every 6 (six) hours as needed for moderate pain.   loratadine 10 MG tablet Commonly known as:  CLARITIN Take 5 mg by mouth daily.   metoprolol succinate 50 MG 24 hr tablet Commonly known as:  TOPROL-XL Take 50 mg by mouth every morning.   predniSONE 5 MG tablet Commonly known as:  DELTASONE Take 5 mg by mouth daily with breakfast.   pseudoephedrine-acetaminophen 30-500 MG Tabs tablet Commonly known as:  TYLENOL SINUS Take 1 tablet by mouth daily as needed (sinus headaches).   traMADol 50 MG tablet Commonly known as:  ULTRAM Take 1-2 tablets (50-100 mg total) by mouth every 6 (six) hours as needed for moderate pain.   TURMERIC PO Take 47.5 mg by mouth daily.   valsartan 80 MG tablet Commonly known as:  DIOVAN Take 80 mg by mouth every morning.       Diagnostic Studies: Dg Shoulder Right Port  Result Date: 02/02/2017 CLINICAL DATA:  Osteoarthritis of the glenohumeral joint. Status post total shoulder replacement. EXAM: PORTABLE RIGHT SHOULDER COMPARISON:  MRI dated 11/14/2016 FINDINGS: The patient has undergone right reverse shoulder arthroplasty. The components of the prosthesis appear in excellent position. No fractures. IMPRESSION: Satisfactory appearance of the right shoulder after reverse shoulder arthroplasty. Electronically Signed   By: Lorriane Shire M.D.   On: 02/02/2017 12:27    Disposition: 01-Home or Self Care  Discharge Instructions    Call MD / Call 911    Complete by:  As directed    If you experience chest pain or shortness of breath, CALL 911 and be transported to the hospital emergency room.  If you develope a fever above 101 F, pus (white drainage) or increased drainage or redness at the wound, or calf pain, call your surgeon's office.   Constipation Prevention    Complete by:  As directed    Drink plenty of fluids.  Prune juice may be helpful.  You may use a stool softener, such as Colace (over the counter) 100 mg twice  a day.  Use MiraLax (over the counter) for constipation as needed.   Diet - low sodium heart healthy    Complete by:  As directed    Increase activity slowly as tolerated    Complete by:  As directed       Follow-up Information    NORRIS,STEVEN R, MD. Call in 2 weeks.   Specialty:  Orthopedic Surgery Why:  [phone] Contact information: [address] [place] [address] [phone]            Signed: Cecilie Kicks. 02/03/2017, 8:40 AM

## 2017-02-05 ENCOUNTER — Encounter (HOSPITAL_COMMUNITY): Payer: Self-pay | Admitting: Orthopedic Surgery

## 2017-02-05 NOTE — Op Note (Signed)
[name], [name]              ACCOUNT NO.:  [id number]  MEDICAL RECORD NO.:  [id number]  LOCATION:                                 FACILITY:  PHYSICIAN:  Doran Heater. Veverly Fells, M.D. DATE OF BIRTH:  11/10/1946  DATE OF PROCEDURE:  02/02/2017 DATE OF DISCHARGE:                              OPERATIVE REPORT   PREOPERATIVE DIAGNOSIS:  Right shoulder rheumatoid arthritis and rotator cuff tear.  POSTOPERATIVE DIAGNOSIS:  Right shoulder rheumatoid arthritis and rotator cuff tear.  PROCEDURE PERFORMED:  Right reverse total shoulder arthroplasty using DePuy Delta Xtend prosthesis.  ATTENDING SURGEON:  Doran Heater. Veverly Fells, MD.  ASSISTANT:  Abbott Pao. Dixon, PA-C, who was scrubbed the entire procedure and necessary for satisfactory completion of surgery.  ANESTHESIA:  General anesthesia was used plus interscalene block.  ESTIMATED BLOOD LOSS:  Less than 100 mL.  FLUID REPLACEMENT:  1000 mL crystalloid.  INSTRUMENT COUNTS:  Correct.  COMPLICATIONS:  There were no complications.  ANTIBIOTICS:  Perioperative antibiotics were given.  INDICATIONS:  The patient is a 71 year old female with worsening right shoulder pain secondary to rheumatoid arthritis and severe rotator cuff tearing.  The patient has loss of fixed fulcrum mechanics and had progressive pain despite conservative management consistent with rotator cuff tear arthropathy.  The patient presents now for reverse total shoulder arthroplasty to relieve pain and restore function.  Informed consent obtained.  DESCRIPTION OF PROCEDURE:  After an adequate level of anesthesia achieved, the patient was positioned in a modified beach-chair position. Right shoulder correctly identified and sterilely prepped and draped in usual manner.  Time-out was called.  We initiated surgery using standard deltopectoral approach starting at the coracoid process extending down to the anterior humerus.  Dissection was down through the  subcutaneous tissues using Bovie electrocautery, identified the cephalic vein, took it laterally to the deltoid, pectoralis was taken medially, conjoint tendon identified and retracted medially.  The remnant of the subscap was released subperiosteally off the lesser tuberosity and tagged for protection of the axillary nerve.  The capsule was released off the inferior humeral neck with progressively externally rotated the humerus. The biceps was tenodesed in situ and then divided at the joint line. The tendon path thickened and torn, supraspinatus and infraspinatus tendons were removed in their entirety.  We left the teres minor intact. We extended the shoulder exposing the humeral head.  We entered the proximal humerus with a 6-mm reamer and reamed up to a size 10.  We then went ahead and placed our 10 intramedullary resection guide for the head resection.  We used an oscillating saw and resected the head at the appropriate level in 10 degrees of retroversion.  We then removed that guide and then removed excess osteophytes on the humeral side with a rongeur.  We then reamed for the metaphyseal component, which was the epi-1 right, set on the 0 setting and placed in 10 degrees of retroversion.  Once we had that milled or reamed, we then went ahead and impacted our trial stem in place with a 10 stem with an epi-1 right, set on the 0 setting, placed in 10 degrees of retroversion.  We then retained the implant in the  shoulder to protect our bone as her bone was extremely soft, definitely osteoporotic.  We then retracted the humerus posteriorly, went ahead and removed the biceps and the superior labrum. We did a 360-degree capsulolabral removal protecting the axillary nerve, inspected to make sure there was no other rotator cuff tissue that could cause impingement.  We then found the center point of the humeral head. We then removed the cartilage off the glenoid face.  This was an extremely  small glenoid in the 24-25 mm range.  We knew we would only get two screws in this, I really just drop the reamer and to begin trying to do some reaming over a central guidepin and immediately, there was a damage to the inferior bone just a little bit of subchondral plate, bone was removed.  This was a small area, maybe 2 x 3 mm, but at that point, we decided really basically do no more reaming.  We felt like there would be relatively good contact of the baseplate on her native glenoid as we could basically see through her subchondral plate there.  So, we drilled our central peg hole, impacted our metaglene into position.  We then went ahead and placed screws; a 48 inferiorly and a 30 at the base of the coracoid.  We had good purchase, I was thankful for that as I was concerned about the purchase of the screws as poor as her bone is and I did feel like we had a stable baseplate that was not rocking and was not loose.  We then placed a 38 standard glenoid in position, impacted that and screwed it secure.  Once that was secured, then we went ahead and trialed with a 38+ 3 poly and felt like that was the appropriate poly with.  We ranged her shoulder.  We had nice stability and tensioning of the conjoined was appropriate.  We removed the trial components from the humeral side, irrigated thoroughly, because of how the poor her bone was, I had made a decision to do a hybrid fixation, so we did vacuum mix the DePuy high-viscosity cement and placed a small amount of that in the metaphyseal section.  We irrigated and dried the bone and then just cemented the metaphyseal portion to leave the HA coating touching real bone proximally, so we inserted the HA stem, the 10 stem with the epi-1 right, set on 0 setting, placed in 10 degrees of retroversion, impacted that in with the cement and then went ahead and placed the real 38+ 3 poly, impacted that and reduced the shoulder.  We had good stability and  then removed the remnant of the subscapularis using the Bovie, irrigated thoroughly and then closed the deltopectoral interval with 0 Vicryl suture followed by 2-0 Vicryl for the subcutaneous closure and 4-0 Monocryl for skin. Steri-Strips applied followed by sterile dressing.  The patient tolerated the surgery well.     Doran Heater. Veverly Fells, M.D.   ______________________________ Doran Heater. Veverly Fells, M.D.    SRN/MEDQ  D:  02/02/2017  T:  02/03/2017  Job:  [id number]

## 2017-02-20 DIAGNOSIS — Z96611 Presence of right artificial shoulder joint: Secondary | ICD-10-CM | POA: Diagnosis not present

## 2017-02-20 DIAGNOSIS — Z471 Aftercare following joint replacement surgery: Secondary | ICD-10-CM | POA: Diagnosis not present

## 2017-02-27 DIAGNOSIS — E790 Hyperuricemia without signs of inflammatory arthritis and tophaceous disease: Secondary | ICD-10-CM | POA: Diagnosis not present

## 2017-02-27 DIAGNOSIS — M0579 Rheumatoid arthritis with rheumatoid factor of multiple sites without organ or systems involvement: Secondary | ICD-10-CM | POA: Diagnosis not present

## 2017-02-27 DIAGNOSIS — M353 Polymyalgia rheumatica: Secondary | ICD-10-CM | POA: Diagnosis not present

## 2017-02-27 DIAGNOSIS — Z6823 Body mass index (BMI) 23.0-23.9, adult: Secondary | ICD-10-CM | POA: Diagnosis not present

## 2017-02-27 DIAGNOSIS — Z79899 Other long term (current) drug therapy: Secondary | ICD-10-CM | POA: Diagnosis not present

## 2017-02-27 DIAGNOSIS — M2550 Pain in unspecified joint: Secondary | ICD-10-CM | POA: Diagnosis not present

## 2017-03-01 DIAGNOSIS — M25511 Pain in right shoulder: Secondary | ICD-10-CM | POA: Diagnosis not present

## 2017-03-06 DIAGNOSIS — M25511 Pain in right shoulder: Secondary | ICD-10-CM | POA: Diagnosis not present

## 2017-03-08 DIAGNOSIS — M25511 Pain in right shoulder: Secondary | ICD-10-CM | POA: Diagnosis not present

## 2017-03-09 DIAGNOSIS — M0579 Rheumatoid arthritis with rheumatoid factor of multiple sites without organ or systems involvement: Secondary | ICD-10-CM | POA: Diagnosis not present

## 2017-03-12 DIAGNOSIS — M25511 Pain in right shoulder: Secondary | ICD-10-CM | POA: Diagnosis not present

## 2017-03-15 DIAGNOSIS — M25511 Pain in right shoulder: Secondary | ICD-10-CM | POA: Diagnosis not present

## 2017-03-20 DIAGNOSIS — S46011D Strain of muscle(s) and tendon(s) of the rotator cuff of right shoulder, subsequent encounter: Secondary | ICD-10-CM | POA: Diagnosis not present

## 2017-03-20 DIAGNOSIS — M25511 Pain in right shoulder: Secondary | ICD-10-CM | POA: Diagnosis not present

## 2017-03-20 DIAGNOSIS — Z471 Aftercare following joint replacement surgery: Secondary | ICD-10-CM | POA: Diagnosis not present

## 2017-03-20 DIAGNOSIS — Z96611 Presence of right artificial shoulder joint: Secondary | ICD-10-CM | POA: Diagnosis not present

## 2017-03-20 DIAGNOSIS — M19011 Primary osteoarthritis, right shoulder: Secondary | ICD-10-CM | POA: Diagnosis not present

## 2017-03-22 DIAGNOSIS — M25511 Pain in right shoulder: Secondary | ICD-10-CM | POA: Diagnosis not present

## 2017-03-27 DIAGNOSIS — M25511 Pain in right shoulder: Secondary | ICD-10-CM | POA: Diagnosis not present

## 2017-03-29 DIAGNOSIS — M25511 Pain in right shoulder: Secondary | ICD-10-CM | POA: Diagnosis not present

## 2017-04-03 DIAGNOSIS — M25511 Pain in right shoulder: Secondary | ICD-10-CM | POA: Diagnosis not present

## 2017-04-04 ENCOUNTER — Ambulatory Visit (INDEPENDENT_AMBULATORY_CARE_PROVIDER_SITE_OTHER)
Admission: RE | Admit: 2017-04-04 | Discharge: 2017-04-04 | Disposition: A | Discharge status: Home / Self Care | Payer: Medicare Other | Source: Ambulatory Visit | Attending: Acute Care | Admitting: Acute Care

## 2017-04-04 DIAGNOSIS — Z87891 Personal history of nicotine dependence: Secondary | ICD-10-CM | POA: Diagnosis not present

## 2017-04-05 DIAGNOSIS — M25511 Pain in right shoulder: Secondary | ICD-10-CM | POA: Diagnosis not present

## 2017-04-06 DIAGNOSIS — M0579 Rheumatoid arthritis with rheumatoid factor of multiple sites without organ or systems involvement: Secondary | ICD-10-CM | POA: Diagnosis not present

## 2017-04-10 DIAGNOSIS — M25511 Pain in right shoulder: Secondary | ICD-10-CM | POA: Diagnosis not present

## 2017-04-11 ENCOUNTER — Other Ambulatory Visit: Payer: Self-pay | Admitting: Acute Care

## 2017-04-11 DIAGNOSIS — Z87891 Personal history of nicotine dependence: Secondary | ICD-10-CM

## 2017-04-13 DIAGNOSIS — M25511 Pain in right shoulder: Secondary | ICD-10-CM | POA: Diagnosis not present

## 2017-04-17 DIAGNOSIS — M25511 Pain in right shoulder: Secondary | ICD-10-CM | POA: Diagnosis not present

## 2017-04-19 DIAGNOSIS — M25511 Pain in right shoulder: Secondary | ICD-10-CM | POA: Diagnosis not present

## 2017-04-23 DIAGNOSIS — M25511 Pain in right shoulder: Secondary | ICD-10-CM | POA: Diagnosis not present

## 2017-04-26 DIAGNOSIS — M25511 Pain in right shoulder: Secondary | ICD-10-CM | POA: Diagnosis not present

## 2017-05-01 DIAGNOSIS — M19011 Primary osteoarthritis, right shoulder: Secondary | ICD-10-CM | POA: Diagnosis not present

## 2017-05-01 DIAGNOSIS — Z96611 Presence of right artificial shoulder joint: Secondary | ICD-10-CM | POA: Diagnosis not present

## 2017-05-01 DIAGNOSIS — Z471 Aftercare following joint replacement surgery: Secondary | ICD-10-CM | POA: Diagnosis not present

## 2017-05-31 DIAGNOSIS — E790 Hyperuricemia without signs of inflammatory arthritis and tophaceous disease: Secondary | ICD-10-CM | POA: Diagnosis not present

## 2017-05-31 DIAGNOSIS — Z79899 Other long term (current) drug therapy: Secondary | ICD-10-CM | POA: Diagnosis not present

## 2017-05-31 DIAGNOSIS — R634 Abnormal weight loss: Secondary | ICD-10-CM | POA: Diagnosis not present

## 2017-05-31 DIAGNOSIS — Z6822 Body mass index (BMI) 22.0-22.9, adult: Secondary | ICD-10-CM | POA: Diagnosis not present

## 2017-05-31 DIAGNOSIS — M2550 Pain in unspecified joint: Secondary | ICD-10-CM | POA: Diagnosis not present

## 2017-05-31 DIAGNOSIS — M0579 Rheumatoid arthritis with rheumatoid factor of multiple sites without organ or systems involvement: Secondary | ICD-10-CM | POA: Diagnosis not present

## 2017-06-01 DIAGNOSIS — Z79899 Other long term (current) drug therapy: Secondary | ICD-10-CM | POA: Diagnosis not present

## 2017-06-01 DIAGNOSIS — M0579 Rheumatoid arthritis with rheumatoid factor of multiple sites without organ or systems involvement: Secondary | ICD-10-CM | POA: Diagnosis not present

## 2017-06-01 DIAGNOSIS — D539 Nutritional anemia, unspecified: Secondary | ICD-10-CM | POA: Diagnosis not present

## 2017-06-01 DIAGNOSIS — R634 Abnormal weight loss: Secondary | ICD-10-CM | POA: Diagnosis not present

## 2017-06-12 DIAGNOSIS — D692 Other nonthrombocytopenic purpura: Secondary | ICD-10-CM | POA: Diagnosis not present

## 2017-07-03 DIAGNOSIS — Z471 Aftercare following joint replacement surgery: Secondary | ICD-10-CM | POA: Diagnosis not present

## 2017-07-03 DIAGNOSIS — G8929 Other chronic pain: Secondary | ICD-10-CM | POA: Diagnosis not present

## 2017-07-03 DIAGNOSIS — M25512 Pain in left shoulder: Secondary | ICD-10-CM | POA: Diagnosis not present

## 2017-07-03 DIAGNOSIS — Z96611 Presence of right artificial shoulder joint: Secondary | ICD-10-CM | POA: Diagnosis not present

## 2017-07-11 DIAGNOSIS — M0579 Rheumatoid arthritis with rheumatoid factor of multiple sites without organ or systems involvement: Secondary | ICD-10-CM | POA: Diagnosis not present

## 2017-07-11 DIAGNOSIS — Z79899 Other long term (current) drug therapy: Secondary | ICD-10-CM | POA: Diagnosis not present

## 2017-07-11 DIAGNOSIS — E790 Hyperuricemia without signs of inflammatory arthritis and tophaceous disease: Secondary | ICD-10-CM | POA: Diagnosis not present

## 2017-07-11 DIAGNOSIS — M25512 Pain in left shoulder: Secondary | ICD-10-CM | POA: Diagnosis not present

## 2017-07-11 DIAGNOSIS — Z6822 Body mass index (BMI) 22.0-22.9, adult: Secondary | ICD-10-CM | POA: Diagnosis not present

## 2017-07-11 DIAGNOSIS — M5431 Sciatica, right side: Secondary | ICD-10-CM | POA: Diagnosis not present

## 2017-07-11 DIAGNOSIS — G8929 Other chronic pain: Secondary | ICD-10-CM | POA: Diagnosis not present

## 2017-07-11 DIAGNOSIS — M2550 Pain in unspecified joint: Secondary | ICD-10-CM | POA: Diagnosis not present

## 2017-07-18 DIAGNOSIS — M25511 Pain in right shoulder: Secondary | ICD-10-CM | POA: Diagnosis not present

## 2017-07-18 DIAGNOSIS — Z96611 Presence of right artificial shoulder joint: Secondary | ICD-10-CM | POA: Diagnosis not present

## 2017-07-18 DIAGNOSIS — M19011 Primary osteoarthritis, right shoulder: Secondary | ICD-10-CM | POA: Diagnosis not present

## 2017-07-18 DIAGNOSIS — Z471 Aftercare following joint replacement surgery: Secondary | ICD-10-CM | POA: Diagnosis not present

## 2017-07-19 DIAGNOSIS — M0579 Rheumatoid arthritis with rheumatoid factor of multiple sites without organ or systems involvement: Secondary | ICD-10-CM | POA: Diagnosis not present

## 2017-08-02 DIAGNOSIS — M0579 Rheumatoid arthritis with rheumatoid factor of multiple sites without organ or systems involvement: Secondary | ICD-10-CM | POA: Diagnosis not present

## 2017-08-08 DIAGNOSIS — Z1231 Encounter for screening mammogram for malignant neoplasm of breast: Secondary | ICD-10-CM | POA: Diagnosis not present

## 2017-08-15 NOTE — Addendum Note (Signed)
Addendum  created 08/15/17 1146 by Albertha Ghee, MD   Sign clinical note

## 2017-08-17 DIAGNOSIS — N183 Chronic kidney disease, stage 3 (moderate): Secondary | ICD-10-CM | POA: Diagnosis not present

## 2017-08-17 DIAGNOSIS — K900 Celiac disease: Secondary | ICD-10-CM | POA: Diagnosis not present

## 2017-08-17 DIAGNOSIS — D638 Anemia in other chronic diseases classified elsewhere: Secondary | ICD-10-CM | POA: Diagnosis not present

## 2017-08-17 DIAGNOSIS — K219 Gastro-esophageal reflux disease without esophagitis: Secondary | ICD-10-CM | POA: Diagnosis not present

## 2017-08-17 DIAGNOSIS — E7800 Pure hypercholesterolemia, unspecified: Secondary | ICD-10-CM | POA: Diagnosis not present

## 2017-08-17 DIAGNOSIS — Z1389 Encounter for screening for other disorder: Secondary | ICD-10-CM | POA: Diagnosis not present

## 2017-08-17 DIAGNOSIS — Z0000 Encounter for general adult medical examination without abnormal findings: Secondary | ICD-10-CM | POA: Diagnosis not present

## 2017-08-17 DIAGNOSIS — M353 Polymyalgia rheumatica: Secondary | ICD-10-CM | POA: Diagnosis not present

## 2017-08-17 DIAGNOSIS — D693 Immune thrombocytopenic purpura: Secondary | ICD-10-CM | POA: Diagnosis not present

## 2017-08-17 DIAGNOSIS — I129 Hypertensive chronic kidney disease with stage 1 through stage 4 chronic kidney disease, or unspecified chronic kidney disease: Secondary | ICD-10-CM | POA: Diagnosis not present

## 2017-08-17 DIAGNOSIS — J449 Chronic obstructive pulmonary disease, unspecified: Secondary | ICD-10-CM | POA: Diagnosis not present

## 2017-08-17 DIAGNOSIS — M069 Rheumatoid arthritis, unspecified: Secondary | ICD-10-CM | POA: Diagnosis not present

## 2017-08-28 DIAGNOSIS — E790 Hyperuricemia without signs of inflammatory arthritis and tophaceous disease: Secondary | ICD-10-CM | POA: Diagnosis not present

## 2017-08-28 DIAGNOSIS — M0579 Rheumatoid arthritis with rheumatoid factor of multiple sites without organ or systems involvement: Secondary | ICD-10-CM | POA: Diagnosis not present

## 2017-08-28 DIAGNOSIS — M2550 Pain in unspecified joint: Secondary | ICD-10-CM | POA: Diagnosis not present

## 2017-08-28 DIAGNOSIS — Z6823 Body mass index (BMI) 23.0-23.9, adult: Secondary | ICD-10-CM | POA: Diagnosis not present

## 2017-08-28 DIAGNOSIS — Z79899 Other long term (current) drug therapy: Secondary | ICD-10-CM | POA: Diagnosis not present

## 2017-08-28 DIAGNOSIS — M150 Primary generalized (osteo)arthritis: Secondary | ICD-10-CM | POA: Diagnosis not present

## 2017-08-28 DIAGNOSIS — M12812 Other specific arthropathies, not elsewhere classified, left shoulder: Secondary | ICD-10-CM | POA: Diagnosis not present

## 2017-09-06 DIAGNOSIS — M0579 Rheumatoid arthritis with rheumatoid factor of multiple sites without organ or systems involvement: Secondary | ICD-10-CM | POA: Diagnosis not present

## 2017-09-17 DIAGNOSIS — Z23 Encounter for immunization: Secondary | ICD-10-CM | POA: Diagnosis not present

## 2017-10-15 DIAGNOSIS — D1801 Hemangioma of skin and subcutaneous tissue: Secondary | ICD-10-CM | POA: Diagnosis not present

## 2017-10-15 DIAGNOSIS — D692 Other nonthrombocytopenic purpura: Secondary | ICD-10-CM | POA: Diagnosis not present

## 2017-10-15 DIAGNOSIS — L853 Xerosis cutis: Secondary | ICD-10-CM | POA: Diagnosis not present

## 2017-10-15 DIAGNOSIS — L720 Epidermal cyst: Secondary | ICD-10-CM | POA: Diagnosis not present

## 2017-10-15 DIAGNOSIS — L821 Other seborrheic keratosis: Secondary | ICD-10-CM | POA: Diagnosis not present

## 2017-11-01 DIAGNOSIS — Z79899 Other long term (current) drug therapy: Secondary | ICD-10-CM | POA: Diagnosis not present

## 2017-11-01 DIAGNOSIS — M0579 Rheumatoid arthritis with rheumatoid factor of multiple sites without organ or systems involvement: Secondary | ICD-10-CM | POA: Diagnosis not present

## 2017-11-28 DIAGNOSIS — Z79899 Other long term (current) drug therapy: Secondary | ICD-10-CM | POA: Diagnosis not present

## 2017-11-28 DIAGNOSIS — Z6823 Body mass index (BMI) 23.0-23.9, adult: Secondary | ICD-10-CM | POA: Diagnosis not present

## 2017-11-28 DIAGNOSIS — M2550 Pain in unspecified joint: Secondary | ICD-10-CM | POA: Diagnosis not present

## 2017-11-28 DIAGNOSIS — M0579 Rheumatoid arthritis with rheumatoid factor of multiple sites without organ or systems involvement: Secondary | ICD-10-CM | POA: Diagnosis not present

## 2017-11-28 DIAGNOSIS — M150 Primary generalized (osteo)arthritis: Secondary | ICD-10-CM | POA: Diagnosis not present

## 2017-11-28 DIAGNOSIS — E790 Hyperuricemia without signs of inflammatory arthritis and tophaceous disease: Secondary | ICD-10-CM | POA: Diagnosis not present

## 2017-12-27 DIAGNOSIS — M0579 Rheumatoid arthritis with rheumatoid factor of multiple sites without organ or systems involvement: Secondary | ICD-10-CM | POA: Diagnosis not present

## 2017-12-31 NOTE — Progress Notes (Signed)
[facility]  [phone] [phone]  Clinic Follow up Note   Patient Care Team: [name], MD as PCP - General (Family Medicine)   Date of Service: 01/01/2018  Diagnosis: Anemia and thrombocytopenia   INTERVAL HISTORY:  [name] returns for follow-up. She was last seen by me 1 year ago. She presents to the clinic today noting she had right shoulder surgery in 01/2017. A lot of arthritis in that shoulder is no longer there, her ROM is back after PT. She is on low dose prednisone for her RA.    REVIEW OF SYSTEMS:   Constitutional: Denies fevers, chills or abnormal weight loss  Eyes: Denies blurriness of vision Ears, nose, mouth, throat, and face: Denies mucositis or sore throat Respiratory: Denies cough, dyspnea or wheezes Cardiovascular: Denies palpitation, chest discomfort or lower extremity swelling Gastrointestinal:  Denies nausea, heartburn or change in bowel habits Skin: Denies abnormal skin rashes Lymphatics: Denies new lymphadenopathy or easy bruising MSK: (+) Rheumatoid arthritis  Neurological:Denies numbness, tingling or new weaknesses Behavioral/Psych: Mood is stable, no new changes  All other systems were reviewed with the patient and are negative.  MEDICAL HISTORY:  Past Medical History:  Diagnosis Date  . Arthritis    Rheumatoid arthritis  . Celiac disease   . Chronic kidney disease    stage 3 from MD notes  . Dyspnea    with going up stairs  . Family history of adverse reaction to anesthesia    father had hard time waking up  . Headache    sinus headaches  . Hot flashes   . Hypertension   . Iron deficiency anemia     SURGICAL HISTORY: Past Surgical History:  Procedure Laterality Date  . COLONOSCOPY    . ECTOPIC PREGNANCY SURGERY      x 2  . REVERSE SHOULDER ARTHROPLASTY Right 02/02/2017   Procedure: RIGHT REVERSE SHOULDER ARTHROPLASTY;  Surgeon: Netta Cedars, MD;  Location: [facility];  Service: Orthopedics;   Laterality: Right;    I have reviewed the social history and family history with the patient and they are unchanged from previous note.  ALLERGIES:  is allergic to gluten meal; penicillins; alendronate; and hydroxychloroquine.  MEDICATIONS:  Current Outpatient Medications  Medication Sig Dispense Refill  . acetaminophen (TYLENOL) 500 MG tablet Take 1,000 mg by mouth daily as needed for mild pain.     . B Complex-C (B-COMPLEX WITH VITAMIN C) tablet Take 1 tablet by mouth daily.    . Biotin 1000 MCG tablet Take 1,000 mcg by mouth daily.    . cholecalciferol (VITAMIN D) 1000 UNITS tablet Take 1,000 Units by mouth daily.    . cyclobenzaprine (FLEXERIL) 5 MG tablet TK 1 T PO FOR MUSCLE SPASM MAY REPEAT IN 3 TO 4 HOURS. MAY TAKE UP TO 4 TS A DAY  2  . gabapentin (NEURONTIN) 300 MG capsule Take 300 mg by mouth 3 (three) times daily.     . InFLIXimab (REMICADE IV) Inject 10 mg into the vein as directed. Every 2 months    . Ipratropium-Albuterol (COMBIVENT RESPIMAT) 20-100 MCG/ACT AERS respimat Inhale 2 puffs into the lungs 2 (two) times daily.     Marland Kitchen loratadine (CLARITIN) 10 MG tablet Take 5 mg by mouth daily.    . metoprolol succinate (TOPROL-XL) 50 MG 24 hr tablet Take 50 mg by mouth every morning.     . naproxen (NAPROSYN) 500 MG tablet TK 1 T PO Q 12 H FOR 2 WEEKS  1  .  predniSONE (DELTASONE) 5 MG tablet Take 5 mg by mouth daily with breakfast.    . pseudoephedrine-acetaminophen (TYLENOL SINUS) 30-500 MG TABS tablet Take 1 tablet by mouth daily as needed (sinus headaches).    . TURMERIC PO Take 47.5 mg by mouth daily.    . valsartan (DIOVAN) 80 MG tablet Take 80 mg by mouth every morning.     . Wheat Dextrin (BENEFIBER) POWD Take 1 Dose by mouth daily.     No current facility-administered medications for this visit.     PHYSICAL EXAMINATION: ECOG PERFORMANCE STATUS: 1 - Symptomatic but completely ambulatory  Vitals:   01/01/18 1435  BP: 129/60  Pulse: 75  Resp: 18  Temp: 97.6 F  (36.4 C)  SpO2: 97%   Filed Weights   01/01/18 1435  Weight: 137 lb 3.2 oz (62.2 kg)    GENERAL:alert, no distress and comfortable SKIN: skin color, texture, turgor are normal, no rashes or significant lesions EYES: normal, Conjunctiva are pink and non-injected, sclera clear OROPHARYNX:no exudate, no erythema and lips, buccal mucosa, and tongue normal  NECK: supple, thyroid normal size, non-tender, without nodularity LYMPH:  no palpable lymphadenopathy in the cervical, axillary or inguinal LUNGS: clear to auscultation and percussion with normal breathing effort HEART: regular rate & rhythm and no murmurs and no lower extremity edema ABDOMEN:abdomen soft, non-tender and normal bowel sounds Musculoskeletal:no cyanosis of digits and no clubbing  NEURO: alert & oriented x 3 with fluent speech, no focal motor/sensory deficits  LABORATORY DATA:  I have reviewed the data as listed  CBC Latest Ref Rng & Units 01/01/2018 02/03/2017 01/26/2017  WBC 4.0 - 10.5 K/uL - - 11.5(H)  Hemoglobin 12.0 - 15.0 g/dL - 8.5(L) 10.6(L)  Hematocrit 34.8 - 46.6 % 33.1(L) 27.2(L) 33.2(L)  Platelets 150 - 400 K/uL - - 182     CMP Latest Ref Rng & Units 02/03/2017 01/26/2017 01/04/2016  Glucose 65 - 99 mg/dL 109(H) 134(H) 105  BUN 6 - 20 mg/dL 14 21(H) 21.7  Creatinine 0.44 - 1.00 mg/dL 1.05(H) 1.49(H) 1.2(H)  Sodium 135 - 145 mmol/L 135 134(L) 141  Potassium 3.5 - 5.1 mmol/L 3.7 4.5 4.5  Chloride 101 - 111 mmol/L 99(L) 102 -  CO2 22 - 32 mmol/L _0 Calcium 8.9 - 10.3 mg/dL 8.6(L) 9.6 9.7  Total Protein 6.4 - 8.3 g/dL - - 7.0  Total Bilirubin 0.20 - 1.20 mg/dL - - 0.41  Alkaline Phos 40 - 150 U/L - - 61  AST 5 - 34 U/L - - 26  ALT 0 - 55 U/L - - 24   PATHOLOGY REPORT Bone Marrow, Aspirate,Biopsy, and Clot, right iliac 02/12/2015 - HYPERCELLULAR BONE MARROW FOR AGE WITH TRILINEAGE HEMATOPOIESIS. - SEVERAL SMALL LYMPHOID AGGREGATES ASSOCIATED WITH A MINOR POPULATION OF ABNORMAL B-CELLS . - SEE  COMMENT. PERIPHERAL BLOOD: - MACROCYTIC ANEMIA. - THROMBOCYTOPENIA. Diagnosis Note The aspirate material is limited for accurate cytomorphologic evaluation. Nonetheless, scanty hematopoietic elements along with touch imprints show a mixture of myeloid cell types with progressive maturation and lack of significant dyspoiesis. In addition, there are several variably sized but predominantly small lymphoid aggregates mostly composed of small lymphocytes as represented on limited levels of sectioning. Flow cytometric analysis of the lymphoid population shows a small abnormal B cell population estimated at 9% of all cells with expression of pan B cell antigens including CD20 and CD23 with associated co-expression of CD5. Immunohistochemical stains were performed but the number and size of lymphoid aggregates in the clot  and biopsy sections is further reduced on deeper sectioning. Nonetheless there is predominance of B-cells with possible weak co-expression of CD5. The limited morphologic and phenotypic lymphoid changes are suspicious for early involvement by a low grade B cell lymphoproliferative process, particularly small lymphocytic lymphoma/chronic lymphocytic leukemia. Correlation with cytogenetic studies is recommended. (BNS:kh 02-16-15)  Cytogenetics: normal     RADIOGRAPHIC STUDIES: I have personally reviewed the radiological images as listed and agreed with the findings in the report.  CT Chest Lung Cancer Screening 04/04/17 IMPRESSION: 1. Lung-RADS Category 2, benign appearance or behavior. Continue annual screening with low-dose chest CT without contrast in 12 months 2. Diffuse bronchial wall thickening with emphysema, as above; imaging findings suggestive of underlying COPD. 3. Aortic atherosclerosis   CT chest. Abdomen and pelvis 04/02/2015  IMPRESSION: 1. No evidence for mass or adenopathy within the chest abdomen or pelvis. 2. Negative for splenomegaly. 3. 4 mm anterior  right upper lobe nodule is identified. If the patient is at high risk for bronchogenic carcinoma, follow-up chest CT at 1year is recommended. If the patient is at low risk, no follow-up is needed. This recommendation follows the consensus statement: Guidelines for Management of Small Pulmonary Nodules Detected on CT Scans: A Statement from the [place] as published in Radiology 2005; 237:395-400. 4. Atherosclerosis 5. Thyroid nodule. Consider further evaluation with thyroid sonogram.  ASSESSMENT & PLAN: 72 y.o.  female with past medical history of arthritis, presented with anemia and mild thrombocytopenia  1. 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 SPEP and UPEP with immunofixation was negative. No lab evidence of hemolysis, erythropoietin level is normal. Sedimentation rate was normal, no evidence of chronic inflammation. But she was recently diagnosed with rheumatoid arthritis, anemia of chronic disease is still possible and most likely. -Giving negative bone marrow biopsy, her history of rheumatoid arthritis, and fluctuation of her plt counts, this is likely ITP. No need for treatment given her mild thrombocytopenia. -Her bone marrow biopsy showed several small lymphoid aggregation, suspicious for low-grade B cell lymphoproliferative process, especially SLL. Her peripheral white count is normal, no elevated lymphocytes.  -I previously reviewed her CT scan result from 03/2015, which was negative for adenopathy or splenomegaly.  -I do not think she has clinical evidence of small lymphocytic lymphoma at this point. I'll follow her clinically, and consider repeat a bone marrow biopsy only if her blood counts get worse. -She had a CT Chest from 04/04/17 that was overall benign. -Labs reviewed, Thrombocytopenia nearly  resolved at 131K and anemia improved to 10.9, WBC normal  -She is clinically doing well, physical exam was negative for adenopathy.  -Given her stable counts, and her anemia being related to her RA, I discussed discharging her as she can follow up with her PCP. She is agreeable.  -F/u open    2. Rheumatoid arthritis -She will continue follow-up with her primary care physician and rheumatologist, she is on tapering dose of prednisone -she underwent have right shoulder surgery in 01/2017, arthritis in that location much improved. She continue low dose prednisone under care of her RA.   Plan She will f/u with her PCP for CBC 1-2 time a year -I will see her as needed in future   All questions were answered. The patient knows to call the clinic with any problems, questions or concerns. No barriers to learning was detected.  I spent 10 minutes counseling the patient face to face. The total time spent in the appointment was 15 minutes and more than 50% was on counseling and review of test results     Truitt Merle, MD 01/01/2018      This document serves as a record of services personally performed by Truitt Merle, MD. It was created on her behalf by Joslyn Devon, a trained medical scribe. The creation of this record is based on the scribe's personal observations and the provider's statements to them.    I have reviewed the above documentation for accuracy and completeness, and I agree with the above.

## 2018-01-01 ENCOUNTER — Inpatient Hospital Stay: Payer: Medicare Other | Attending: Hematology | Admitting: Hematology

## 2018-01-01 ENCOUNTER — Inpatient Hospital Stay: Payer: Medicare Other

## 2018-01-01 VITALS — BP 129/60 | HR 75 | Temp 97.6°F | Resp 18 | Ht 63.0 in | Wt 137.2 lb

## 2018-01-01 DIAGNOSIS — D649 Anemia, unspecified: Secondary | ICD-10-CM | POA: Diagnosis not present

## 2018-01-01 DIAGNOSIS — D696 Thrombocytopenia, unspecified: Secondary | ICD-10-CM | POA: Diagnosis not present

## 2018-01-01 DIAGNOSIS — M069 Rheumatoid arthritis, unspecified: Secondary | ICD-10-CM

## 2018-01-01 DIAGNOSIS — D638 Anemia in other chronic diseases classified elsewhere: Secondary | ICD-10-CM

## 2018-01-01 LAB — CBC WITH DIFFERENTIAL (CANCER CENTER ONLY)
Abs Granulocyte: 5.2 10*3/uL (ref 1.5–6.5)
Basophils Absolute: 0.1 10*3/uL (ref 0.0–0.1)
Basophils Relative: 1 %
Eosinophils Absolute: 0 10*3/uL (ref 0.0–0.5)
Eosinophils Relative: 1 %
HCT: 33.1 % — ABNORMAL LOW (ref 34.8–46.6)
Hemoglobin: 10.9 g/dL — ABNORMAL LOW (ref 11.6–15.9)
Lymphocytes Relative: 28 %
Lymphs Abs: 2.3 10*3/uL (ref 0.9–3.3)
MCH: 34 pg (ref 25.1–34.0)
MCHC: 32.9 g/dL (ref 31.5–36.0)
MCV: 103.5 fL — ABNORMAL HIGH (ref 79.5–101.0)
Monocytes Absolute: 0.6 10*3/uL (ref 0.1–0.9)
Monocytes Relative: 7 %
Neutro Abs: 5.2 10*3/uL (ref 1.5–6.5)
Neutrophils Relative %: 63 %
Platelet Count: 131 10*3/uL — ABNORMAL LOW (ref 145–400)
RBC: 3.2 MIL/uL — ABNORMAL LOW (ref 3.70–5.45)
RDW: 14.8 % (ref 11.2–16.1)
WBC Count: 8.1 10*3/uL (ref 4.0–10.3)

## 2018-01-01 LAB — RETICULOCYTES
RBC.: 3.22 MIL/uL — ABNORMAL LOW (ref 3.70–5.45)
Retic Count, Absolute: 64.4 10*3/uL (ref 33.7–90.7)
Retic Ct Pct: 2 % (ref 0.7–2.1)

## 2018-01-02 ENCOUNTER — Encounter: Payer: Self-pay | Admitting: Hematology

## 2018-01-25 DIAGNOSIS — H6692 Otitis media, unspecified, left ear: Secondary | ICD-10-CM | POA: Diagnosis not present

## 2018-01-25 DIAGNOSIS — H6122 Impacted cerumen, left ear: Secondary | ICD-10-CM | POA: Diagnosis not present

## 2018-02-07 DIAGNOSIS — M26623 Arthralgia of bilateral temporomandibular joint: Secondary | ICD-10-CM | POA: Diagnosis not present

## 2018-02-07 DIAGNOSIS — H9202 Otalgia, left ear: Secondary | ICD-10-CM | POA: Diagnosis not present

## 2018-02-18 DIAGNOSIS — K089 Disorder of teeth and supporting structures, unspecified: Secondary | ICD-10-CM | POA: Diagnosis not present

## 2018-02-18 DIAGNOSIS — I129 Hypertensive chronic kidney disease with stage 1 through stage 4 chronic kidney disease, or unspecified chronic kidney disease: Secondary | ICD-10-CM | POA: Diagnosis not present

## 2018-02-18 DIAGNOSIS — N183 Chronic kidney disease, stage 3 (moderate): Secondary | ICD-10-CM | POA: Diagnosis not present

## 2018-02-26 DIAGNOSIS — Z6824 Body mass index (BMI) 24.0-24.9, adult: Secondary | ICD-10-CM | POA: Diagnosis not present

## 2018-02-26 DIAGNOSIS — M0579 Rheumatoid arthritis with rheumatoid factor of multiple sites without organ or systems involvement: Secondary | ICD-10-CM | POA: Diagnosis not present

## 2018-02-26 DIAGNOSIS — M2550 Pain in unspecified joint: Secondary | ICD-10-CM | POA: Diagnosis not present

## 2018-02-26 DIAGNOSIS — Z79899 Other long term (current) drug therapy: Secondary | ICD-10-CM | POA: Diagnosis not present

## 2018-02-26 DIAGNOSIS — E790 Hyperuricemia without signs of inflammatory arthritis and tophaceous disease: Secondary | ICD-10-CM | POA: Diagnosis not present

## 2018-02-28 DIAGNOSIS — M0579 Rheumatoid arthritis with rheumatoid factor of multiple sites without organ or systems involvement: Secondary | ICD-10-CM | POA: Diagnosis not present

## 2018-04-05 ENCOUNTER — Ambulatory Visit (INDEPENDENT_AMBULATORY_CARE_PROVIDER_SITE_OTHER)
Admission: RE | Admit: 2018-04-05 | Discharge: 2018-04-05 | Disposition: A | Discharge status: Home / Self Care | Payer: Medicare Other | Source: Ambulatory Visit | Attending: Acute Care | Admitting: Acute Care

## 2018-04-05 DIAGNOSIS — Z87891 Personal history of nicotine dependence: Secondary | ICD-10-CM | POA: Diagnosis not present

## 2018-04-08 ENCOUNTER — Other Ambulatory Visit: Payer: Self-pay | Admitting: Acute Care

## 2018-04-08 DIAGNOSIS — Z87891 Personal history of nicotine dependence: Secondary | ICD-10-CM

## 2018-04-08 DIAGNOSIS — Z122 Encounter for screening for malignant neoplasm of respiratory organs: Secondary | ICD-10-CM

## 2018-04-25 DIAGNOSIS — M0579 Rheumatoid arthritis with rheumatoid factor of multiple sites without organ or systems involvement: Secondary | ICD-10-CM | POA: Diagnosis not present

## 2018-04-25 DIAGNOSIS — Z79899 Other long term (current) drug therapy: Secondary | ICD-10-CM | POA: Diagnosis not present

## 2018-06-20 DIAGNOSIS — M0579 Rheumatoid arthritis with rheumatoid factor of multiple sites without organ or systems involvement: Secondary | ICD-10-CM | POA: Diagnosis not present

## 2018-07-10 DIAGNOSIS — E790 Hyperuricemia without signs of inflammatory arthritis and tophaceous disease: Secondary | ICD-10-CM | POA: Diagnosis not present

## 2018-07-10 DIAGNOSIS — Z6824 Body mass index (BMI) 24.0-24.9, adult: Secondary | ICD-10-CM | POA: Diagnosis not present

## 2018-07-10 DIAGNOSIS — M0579 Rheumatoid arthritis with rheumatoid factor of multiple sites without organ or systems involvement: Secondary | ICD-10-CM | POA: Diagnosis not present

## 2018-07-10 DIAGNOSIS — Z79899 Other long term (current) drug therapy: Secondary | ICD-10-CM | POA: Diagnosis not present

## 2018-07-10 DIAGNOSIS — M2550 Pain in unspecified joint: Secondary | ICD-10-CM | POA: Diagnosis not present

## 2018-08-14 DIAGNOSIS — M810 Age-related osteoporosis without current pathological fracture: Secondary | ICD-10-CM | POA: Diagnosis not present

## 2018-08-14 DIAGNOSIS — Z1231 Encounter for screening mammogram for malignant neoplasm of breast: Secondary | ICD-10-CM | POA: Diagnosis not present

## 2018-08-14 DIAGNOSIS — M8589 Other specified disorders of bone density and structure, multiple sites: Secondary | ICD-10-CM | POA: Diagnosis not present

## 2018-08-15 DIAGNOSIS — M0579 Rheumatoid arthritis with rheumatoid factor of multiple sites without organ or systems involvement: Secondary | ICD-10-CM | POA: Diagnosis not present

## 2018-09-03 DIAGNOSIS — D509 Iron deficiency anemia, unspecified: Secondary | ICD-10-CM | POA: Diagnosis not present

## 2018-09-03 DIAGNOSIS — M810 Age-related osteoporosis without current pathological fracture: Secondary | ICD-10-CM | POA: Diagnosis not present

## 2018-09-03 DIAGNOSIS — Z23 Encounter for immunization: Secondary | ICD-10-CM | POA: Diagnosis not present

## 2018-09-03 DIAGNOSIS — E7800 Pure hypercholesterolemia, unspecified: Secondary | ICD-10-CM | POA: Diagnosis not present

## 2018-09-03 DIAGNOSIS — K219 Gastro-esophageal reflux disease without esophagitis: Secondary | ICD-10-CM | POA: Diagnosis not present

## 2018-09-03 DIAGNOSIS — N183 Chronic kidney disease, stage 3 (moderate): Secondary | ICD-10-CM | POA: Diagnosis not present

## 2018-09-03 DIAGNOSIS — Z0000 Encounter for general adult medical examination without abnormal findings: Secondary | ICD-10-CM | POA: Diagnosis not present

## 2018-09-03 DIAGNOSIS — I129 Hypertensive chronic kidney disease with stage 1 through stage 4 chronic kidney disease, or unspecified chronic kidney disease: Secondary | ICD-10-CM | POA: Diagnosis not present

## 2018-09-03 DIAGNOSIS — M353 Polymyalgia rheumatica: Secondary | ICD-10-CM | POA: Diagnosis not present

## 2018-09-03 DIAGNOSIS — J449 Chronic obstructive pulmonary disease, unspecified: Secondary | ICD-10-CM | POA: Diagnosis not present

## 2018-09-03 DIAGNOSIS — D693 Immune thrombocytopenic purpura: Secondary | ICD-10-CM | POA: Diagnosis not present

## 2018-09-03 DIAGNOSIS — Z1339 Encounter for screening examination for other mental health and behavioral disorders: Secondary | ICD-10-CM | POA: Diagnosis not present

## 2018-09-11 DIAGNOSIS — H9202 Otalgia, left ear: Secondary | ICD-10-CM | POA: Diagnosis not present

## 2018-10-01 DIAGNOSIS — H02413 Mechanical ptosis of bilateral eyelids: Secondary | ICD-10-CM | POA: Diagnosis not present

## 2018-10-10 DIAGNOSIS — M0579 Rheumatoid arthritis with rheumatoid factor of multiple sites without organ or systems involvement: Secondary | ICD-10-CM | POA: Diagnosis not present

## 2018-10-14 DIAGNOSIS — H25812 Combined forms of age-related cataract, left eye: Secondary | ICD-10-CM | POA: Diagnosis not present

## 2018-10-14 DIAGNOSIS — H25811 Combined forms of age-related cataract, right eye: Secondary | ICD-10-CM | POA: Diagnosis not present

## 2018-10-14 DIAGNOSIS — Z01818 Encounter for other preprocedural examination: Secondary | ICD-10-CM | POA: Diagnosis not present

## 2018-11-01 DIAGNOSIS — H2511 Age-related nuclear cataract, right eye: Secondary | ICD-10-CM | POA: Diagnosis not present

## 2018-11-01 DIAGNOSIS — H25811 Combined forms of age-related cataract, right eye: Secondary | ICD-10-CM | POA: Diagnosis not present

## 2018-11-18 DIAGNOSIS — D692 Other nonthrombocytopenic purpura: Secondary | ICD-10-CM | POA: Diagnosis not present

## 2018-11-18 DIAGNOSIS — L821 Other seborrheic keratosis: Secondary | ICD-10-CM | POA: Diagnosis not present

## 2018-11-18 DIAGNOSIS — D225 Melanocytic nevi of trunk: Secondary | ICD-10-CM | POA: Diagnosis not present

## 2018-12-05 DIAGNOSIS — Z79899 Other long term (current) drug therapy: Secondary | ICD-10-CM | POA: Diagnosis not present

## 2018-12-05 DIAGNOSIS — M0579 Rheumatoid arthritis with rheumatoid factor of multiple sites without organ or systems involvement: Secondary | ICD-10-CM | POA: Diagnosis not present

## 2019-01-02 DIAGNOSIS — H25812 Combined forms of age-related cataract, left eye: Secondary | ICD-10-CM | POA: Diagnosis not present

## 2019-01-02 DIAGNOSIS — H2512 Age-related nuclear cataract, left eye: Secondary | ICD-10-CM | POA: Diagnosis not present

## 2019-01-09 DIAGNOSIS — E663 Overweight: Secondary | ICD-10-CM | POA: Diagnosis not present

## 2019-01-09 DIAGNOSIS — Z79899 Other long term (current) drug therapy: Secondary | ICD-10-CM | POA: Diagnosis not present

## 2019-01-09 DIAGNOSIS — Z6825 Body mass index (BMI) 25.0-25.9, adult: Secondary | ICD-10-CM | POA: Diagnosis not present

## 2019-01-09 DIAGNOSIS — M19049 Primary osteoarthritis, unspecified hand: Secondary | ICD-10-CM | POA: Diagnosis not present

## 2019-01-09 DIAGNOSIS — M2550 Pain in unspecified joint: Secondary | ICD-10-CM | POA: Diagnosis not present

## 2019-01-09 DIAGNOSIS — M0579 Rheumatoid arthritis with rheumatoid factor of multiple sites without organ or systems involvement: Secondary | ICD-10-CM | POA: Diagnosis not present

## 2019-01-09 DIAGNOSIS — E790 Hyperuricemia without signs of inflammatory arthritis and tophaceous disease: Secondary | ICD-10-CM | POA: Diagnosis not present

## 2019-01-30 DIAGNOSIS — M0579 Rheumatoid arthritis with rheumatoid factor of multiple sites without organ or systems involvement: Secondary | ICD-10-CM | POA: Diagnosis not present

## 2019-03-17 DIAGNOSIS — H02539 Eyelid retraction unspecified eye, unspecified lid: Secondary | ICD-10-CM | POA: Diagnosis not present

## 2019-03-17 DIAGNOSIS — N183 Chronic kidney disease, stage 3 (moderate): Secondary | ICD-10-CM | POA: Diagnosis not present

## 2019-03-17 DIAGNOSIS — M810 Age-related osteoporosis without current pathological fracture: Secondary | ICD-10-CM | POA: Diagnosis not present

## 2019-03-17 DIAGNOSIS — I129 Hypertensive chronic kidney disease with stage 1 through stage 4 chronic kidney disease, or unspecified chronic kidney disease: Secondary | ICD-10-CM | POA: Diagnosis not present

## 2019-03-27 DIAGNOSIS — M0579 Rheumatoid arthritis with rheumatoid factor of multiple sites without organ or systems involvement: Secondary | ICD-10-CM | POA: Diagnosis not present

## 2019-05-22 DIAGNOSIS — M0579 Rheumatoid arthritis with rheumatoid factor of multiple sites without organ or systems involvement: Secondary | ICD-10-CM | POA: Diagnosis not present

## 2019-05-30 ENCOUNTER — Other Ambulatory Visit: Payer: Self-pay | Admitting: Acute Care

## 2019-05-30 DIAGNOSIS — Z87891 Personal history of nicotine dependence: Secondary | ICD-10-CM

## 2019-05-30 DIAGNOSIS — Z122 Encounter for screening for malignant neoplasm of respiratory organs: Secondary | ICD-10-CM

## 2019-06-26 DIAGNOSIS — M0579 Rheumatoid arthritis with rheumatoid factor of multiple sites without organ or systems involvement: Secondary | ICD-10-CM | POA: Diagnosis not present

## 2019-06-26 DIAGNOSIS — D539 Nutritional anemia, unspecified: Secondary | ICD-10-CM | POA: Diagnosis not present

## 2019-06-26 DIAGNOSIS — E790 Hyperuricemia without signs of inflammatory arthritis and tophaceous disease: Secondary | ICD-10-CM | POA: Diagnosis not present

## 2019-06-26 DIAGNOSIS — M1812 Unilateral primary osteoarthritis of first carpometacarpal joint, left hand: Secondary | ICD-10-CM | POA: Diagnosis not present

## 2019-06-26 DIAGNOSIS — Z79899 Other long term (current) drug therapy: Secondary | ICD-10-CM | POA: Diagnosis not present

## 2019-06-26 DIAGNOSIS — M2550 Pain in unspecified joint: Secondary | ICD-10-CM | POA: Diagnosis not present

## 2019-06-30 DIAGNOSIS — H0230 Blepharochalasis unspecified eye, unspecified eyelid: Secondary | ICD-10-CM | POA: Diagnosis not present

## 2019-07-04 ENCOUNTER — Telehealth: Payer: Self-pay | Admitting: Acute Care

## 2019-07-04 NOTE — Telephone Encounter (Signed)
Script Screening patients for COVID-19 and reviewing new operational procedures  Greeting - The reason I am calling is to share with you some new changes to our processes that are designed to help [place] keep everyone safe. Is now a good time to speak with you? Patient says "no' - ask them when you can call back and let them know it's important to do this prior to their appointment.  Patient says "yes" - Karryn, Kos[place] the first thing I need to do is ask you some screening Questions.  1. To the best of your knowledge, have you been in close contact with any one with a confirmed diagnosis of COVID 19? o No - proceed to next question  2. Have you had any one or more of the following: fever, chills, cough, shortness of breath or any flu-like symptoms? o No - proceed to next question  3. Have you been diagnosed with or have a previous diagnosis of COVID 19? o No - proceed to next question  4. I am going to go over a few other symptoms with you. Please let me know if you are experiencing any of the following: . Ear, nose or throat discomfort . A sore throat . Headache . Muscle pain . Diarrhea . Loss of taste or smell o No - proceed to next question  Thank you for answering these questions. Please know we will ask you these questions or similar questions when you arrive for your appointment and again it's how we are keeping everyone safe. Also, to keep you safe, please use the provided hand sanitizer when you enter the building. (Insert pt name), we are asking everyone in the building to wear a mask because they help [place] prevent the spread of germs. Do you have a mask of your own, if not, we are happy to provide one for you. The last thing I want to go over with you is the no visitor guidelines. This means no one can attend the appointment with you unless you need physical assistance. I understand this may be different from your past appointments and I know this may be difficult but please  know if someone is driving you we are happy to call them for you once your appointment is over.  [INSERT [place]  (Insert pt name) I've given you a lot of information, what questions do you have about what I've talked about today or your appointment tomorrow?  Benjie Karvonen, CMA

## 2019-07-07 ENCOUNTER — Ambulatory Visit (INDEPENDENT_AMBULATORY_CARE_PROVIDER_SITE_OTHER)
Admission: RE | Admit: 2019-07-07 | Discharge: 2019-07-07 | Disposition: A | Discharge status: Home / Self Care | Payer: Medicare Other | Source: Ambulatory Visit | Attending: Acute Care | Admitting: Acute Care

## 2019-07-07 ENCOUNTER — Other Ambulatory Visit: Payer: Self-pay

## 2019-07-07 DIAGNOSIS — Z87891 Personal history of nicotine dependence: Secondary | ICD-10-CM

## 2019-07-07 DIAGNOSIS — Z122 Encounter for screening for malignant neoplasm of respiratory organs: Secondary | ICD-10-CM

## 2019-07-09 ENCOUNTER — Ambulatory Visit (INDEPENDENT_AMBULATORY_CARE_PROVIDER_SITE_OTHER): Payer: Medicare Other | Admitting: Acute Care

## 2019-07-09 ENCOUNTER — Other Ambulatory Visit: Payer: Self-pay

## 2019-07-09 ENCOUNTER — Encounter: Payer: Self-pay | Admitting: Acute Care

## 2019-07-09 DIAGNOSIS — R918 Other nonspecific abnormal finding of lung field: Secondary | ICD-10-CM

## 2019-07-09 MED ORDER — DOXYCYCLINE HYCLATE 100 MG PO TABS: 100.0000 mg | ORAL_TABLET | Freq: Two times a day (BID) | ORAL | 0 refills | Status: AC

## 2019-07-09 NOTE — Progress Notes (Signed)
Virtual Visit via Telephone Note  I connected with [name] on 07/09/19 at  1:30 PM EDT by telephone and verified that I am speaking with the correct person using two identifiers.  I  confirmed date of birth and address to authenticate patient  Identity. My nurse Quentin Ore reviewed medications and ordered any refills required.   Location: Patient: At home Provider: At the office [address], [place], [place], [address].   I discussed the limitations, risks, security and privacy concerns of performing an evaluation and management service by telephone and the availability of in person appointments. I also discussed with the patient that there may be a patient responsible charge related to this service. The patient expressed understanding and agreed to proceed.  I  confirmed date of birth and address to authenticate patient  Identity. My nurse Quentin Ore reviewed medications and ordered any refills required.  Pt. Is a 73 year old former smoker , quit 15 years ago. This is her last screening scan as she would no longer qualify after this year, due to having quit smoking 15 years ago.  History of Present Illness: Pt. Is followed by the Lung Cancer Screening Program. Her last scan was a Lung RADS 2: nodules that are benign in appearance and behavior with a very low likelihood of becoming a clinically active cancer due to size or lack of growth. Recommendation per radiology is for a repeat LDCT in 12 months. 2020 scan was + for a Lung RADS 4 A : suspicious findings, either short term follow up in 3 months or alternatively  PET Scan evaluation may be considered when there is a solid component of  8 mm or larger. She presents for follow up to discuss the scan results. Because this scan has the appearance of infectious inflammatory   Did you feel sick when you had the scan? She states she has had some sinus issues. She states she does not have shortness of breath with  exercise.  Have you been having any trouble with swallowing? She denies any trouble swallowing.    Observations/Objective: LDCT 06/2019 Cardiovascular: Heart is normal in size.  No pericardial effusion. No evidence of thoracic aortic aneurysm. Atherosclerotic calcifications of the aortic arch. Mediastinum/Nodes: Small mediastinal lymph nodes which do not meet pathologic CT size criteria. Visualized thyroid is unremarkable. Lungs/Pleura: Biapical pleural-parenchymal scarring. Mild centrilobular emphysematous changes, upper lung predominant. Compressive atelectasis in the medial right lower lobe. 2.6 mm perifissural nodule along the minor fissure, benign. 2.6 mm calcified granuloma in the left lower lobe, benign. New patchy opacity in the lingula, measuring 7.8 mm, likely infectious (image 227) but technically indeterminate. Mild interlobular septal thickening with patchy opacity in the right lower lobe/lung base, also favoring mild infection. No pleural effusion or pneumothorax. Upper Abdomen: Visualized upper abdomen is grossly unremarkable, noting vascular calcifications. Musculoskeletal: Mild degenerative changes of the visualized thoracolumbar spine. Right shoulder arthroplasty, incompletely visualized.  IMPRESSION: Lung-RADS 4A, suspicious. Mild patchy lingular and right lower lobe opacities, favoring mild infection. Follow up low-dose chest CT without contrast in 3 months (please use the following order, "CT CHEST LCS NODULE FOLLOW-UP W/O CM") is recommended.  Assessment and Plan: Lung RADS 4 A : suspicious findings, either short term follow up in 3 months or alternatively  PET Scan evaluation may be considered when there is a solid component of  8 mm or larger. New patchy opacity in the lingula, measuring 7.8 mm, likely infectious (image 227) but technically indeterminate. Mild  interlobular septal thickening with patchy opacity in the right lower lobe/lung base,  also favoring mild infection. Favoring mild infection per radiology Plan Doxycycline 100 mg BID x 7 days Follow up low dose CT in 3 months to re-evaluate nodule   Worsening breathlessness with exertion Maintenance of Combivent Exercised daily No rescue inhaler Plan Consultation with one of the pulmonary doctors for PFT's and re-evaluation of appropriate inhaler therapy.  Follow Up Instructions: Doxycycline 100 mg BID x 7 days Follow up Televisit in 2 weeks Low Dose CT in 3 months Call if you need [place] sooner.    I discussed the assessment and treatment plan with the patient. The patient was provided an opportunity to ask questions and all were answered. The patient agreed with the plan and demonstrated an understanding of the instructions.   The patient was advised to call back or seek an in-person evaluation if the symptoms worsen or if the condition fails to improve as anticipated.  I provided 23 minutes of non-face-to-face time during this encounter.   Magdalen Spatz, NP 07/09/2019  1:42 PM

## 2019-07-09 NOTE — Patient Instructions (Signed)
It is good to talk with you today. Your CT chest showed an abnormal nodule. We will do a follow up CT in 3 months to re-evaluate. We will treat you with Doxycycline 100 mg twice daily for 7 days to treat what appears to be an infection in your lungs. Take doxycycline with a full glass of water. Use sun block if you are in direct sun. We will do a follow up phone visit in 2 weeks to see how you are doing. We will schedule you with a physician for a consult to evaluate your inhaler regimen. Please contact office for sooner follow up if symptoms do not improve or worsen or seek emergency care

## 2019-07-10 ENCOUNTER — Other Ambulatory Visit: Payer: Self-pay | Admitting: *Deleted

## 2019-07-10 DIAGNOSIS — Z87891 Personal history of nicotine dependence: Secondary | ICD-10-CM

## 2019-07-10 DIAGNOSIS — Z122 Encounter for screening for malignant neoplasm of respiratory organs: Secondary | ICD-10-CM

## 2019-07-22 ENCOUNTER — Telehealth: Payer: Self-pay

## 2019-07-22 DIAGNOSIS — M0579 Rheumatoid arthritis with rheumatoid factor of multiple sites without organ or systems involvement: Secondary | ICD-10-CM | POA: Diagnosis not present

## 2019-07-22 NOTE — Telephone Encounter (Signed)
LMTCB. Please have patient r/s her Consult appt ( BI is double booked at [facility] on 08/10). BI has openings the entire week of August 10th. Thanks.

## 2019-07-22 NOTE — Telephone Encounter (Signed)
Called & spoke w/ pt about Brittany's message below. Pt verbalized understanding and agreed to rescheduling her Consultation appointment w/ BI to 08/06/2019 at 11:00 AM. Appt has been scheduled. I am routing this message back to [place] as an [place]. Nothing further needed at this time.

## 2019-07-23 ENCOUNTER — Ambulatory Visit (INDEPENDENT_AMBULATORY_CARE_PROVIDER_SITE_OTHER): Payer: Medicare Other | Admitting: Acute Care

## 2019-07-23 ENCOUNTER — Encounter: Payer: Self-pay | Admitting: Acute Care

## 2019-07-23 ENCOUNTER — Other Ambulatory Visit: Payer: Self-pay

## 2019-07-23 DIAGNOSIS — R918 Other nonspecific abnormal finding of lung field: Secondary | ICD-10-CM

## 2019-07-23 DIAGNOSIS — R0600 Dyspnea, unspecified: Secondary | ICD-10-CM

## 2019-07-23 NOTE — Progress Notes (Signed)
Virtual Visit via Video Note  I connected with [name] on 07/23/19 at  2:00 PM EDT by a video enabled telemedicine application and verified that I am speaking with the correct person using two identifiers.  I  confirmed date of birth and address to authenticate patient  Identity. My nurse Quentin Ore reviewed medications and ordered any refills required.  Pt. Is a 73 year old former smoker , quit 15 years ago. This is her last screening scan as she would no longer qualify after this year, due to having quit smoking 15 years ago.She had a 30 pack year smoking history. Maintenance : Combivent  Location: Patient: At home Provider: In the office at [address] [place], [place], [address]   I discussed the limitations of evaluation and management by telemedicine and the availability of in person appointments. The patient expressed understanding and agreed to proceed.  I  confirmed date of birth and address to authenticate patient   Identity. My nurse Quentin Ore reviewed medications and ordered any refills required.  History of Present Illness: Pt was seen virtually 07/09/2019 for a change in her lung cancer screening CT. He scan had progressed from a Lung RADS 2 in 2019 : (nodules that are benign in appearance and behavior with a very low likelihood of becoming a clinically active cancer due to size or lack of growth. Recommendation per radiology is for a repeat LDCT in 12 months.) to a Lung RADS 4 A in 2020 : (suspicious findings, either short term follow up in 3 months or alternatively  PET Scan evaluation may be considered when there is a solid component of  8 mm or larger.)  The scan specified Mild patchy lingular and right lower lobe opacities, favoring mild infection.  Plan was to treat with Doxycycline, and do a follow up LDCT in 3 months to evaluate for resolution or progression of disease. Additionally we placed a formal consult for pulmonary, as she has never  had full  PFT's and pulmonary evaluation.   She presents today for follow up to evaluate for response to therapy. She states she completed her Doxycycline therapy. She states she feels better. She states she didn't realize she was sick, but now after treatment she feels so much better she  realized that she was sick.  She is compliant with her maintenance  Combivent . She feels she is at her baseline. She denies fever, chest pain, orthopnea or hemoptysis. Secretions are at baseline. She states her dyspnea is also better after treatment with the antibiotics.She is scheduled for follow up with Dr. Valeta Harms 08/06/2019.   Observations/Objective: LDCT 06/2019 Cardiovascular: Heart is normal in size. No pericardial effusion. No evidence of thoracic aortic aneurysm. Atherosclerotic calcifications of the aortic arch. Mediastinum/Nodes: Small mediastinal lymph nodes which do not meet pathologic CT size criteria. Visualized thyroid is unremarkable. Lungs/Pleura: Biapical pleural-parenchymal scarring. Mild centrilobular emphysematous changes, upper lung predominant. Compressive atelectasis in the medial right lower lobe. 2.6 mm perifissural nodule along the minor fissure, benign. 2.6 mm calcified granuloma in the left lower lobe, benign. New patchy opacity in the lingula, measuring 7.8 mm, likely infectious (image 227) but technically indeterminate. Mild interlobular septal thickening with patchy opacity in the right lower lobe/lung base, also favoring mild infection. No pleural effusion or pneumothorax. Upper Abdomen: Visualized upper abdomen is grossly unremarkable, noting vascular calcifications. Musculoskeletal: Mild degenerative changes of the visualized thoracolumbar spine. Right shoulder arthroplasty, incompletely visualized.  IMPRESSION: Lung-RADS 4A, suspicious. Mild patchy lingular  and right lower lobe opacities, favoring mild infection. Follow up low-dose chest CT without contrast  in 3 months (please use the following order, "CT CHEST LCS NODULE FOLLOW-UP W/O CM") is recommended.   Assessment and Plan: Lung RADS 4 A : suspicious findings, either short term follow up in 3 months or alternatively  PET Scan evaluation may be considered when there is a solid component of  8 mm or larger. New patchy opacity in the lingula, measuring 7.8 mm, likely infectious (image 227) but technically indeterminate. Mild interlobular septal thickening with patchy opacity in the right lower lobe/lung base, also favoring mild infection. Favoring mild infection per radiology Treated with Doxy x 7 days Plan Follow up low dose CT in 3 months to re-evaluate nodule Call with any worsening symptoms prior as needed   Worsening breathlessness with exertion COPD? No PFT's on file Maintenance of Combivent per PCP Exercised daily No rescue inhaler Plan Continue Combivent as you have been doing Appointment with Dr. Valeta Harms August 12th as is scheduled for formal consult. Will need PFT's and re-evaluation of inhaler therapy. Family Hx. Of COPD  Former Tobacco Abuse Has quit for 15 years so no longer qualifies for screening through the lung cancer screening program Has a 3 month follow up scheduled 09/2019 Is interested in annual screening through Dr. Valeta Harms moving forward Plan Follow up CT Chest after 09/2019 follow up per Dr. Valeta Harms Reinforced smoking abstinence  Follow Up Instructions: Follow up with Dr. Valeta Harms 08/06/2019 as is scheduled.   I discussed the assessment and treatment plan with the patient. The patient was provided an opportunity to ask questions and all were answered. The patient agreed with the plan and demonstrated an understanding of the instructions.   The patient was advised to call back or seek an in-person evaluation if the symptoms worsen or if the condition fails to improve as anticipated.  I provided 35  minutes of non-face-to-face time during this  encounter.   Magdalen Spatz, NP 07/23/2019 2:23 PM

## 2019-08-04 ENCOUNTER — Institutional Professional Consult (permissible substitution): Payer: Medicare Other | Admitting: Pulmonary Disease

## 2019-08-06 ENCOUNTER — Ambulatory Visit (INDEPENDENT_AMBULATORY_CARE_PROVIDER_SITE_OTHER): Payer: Medicare Other | Admitting: Pulmonary Disease

## 2019-08-06 ENCOUNTER — Other Ambulatory Visit: Payer: Self-pay

## 2019-08-06 ENCOUNTER — Encounter: Payer: Self-pay | Admitting: Pulmonary Disease

## 2019-08-06 VITALS — BP 162/88 | HR 111 | Temp 98.3°F | Ht 63.0 in | Wt 133.4 lb

## 2019-08-06 DIAGNOSIS — J449 Chronic obstructive pulmonary disease, unspecified: Secondary | ICD-10-CM | POA: Diagnosis not present

## 2019-08-06 DIAGNOSIS — R918 Other nonspecific abnormal finding of lung field: Secondary | ICD-10-CM | POA: Diagnosis not present

## 2019-08-06 MED ORDER — STIOLTO RESPIMAT 2.5-2.5 MCG/ACT IN AERS: 2.0000 | INHALATION_SPRAY | Freq: Every day | RESPIRATORY_TRACT | 0 refills | Status: DC

## 2019-08-06 MED ORDER — STIOLTO RESPIMAT 2.5-2.5 MCG/ACT IN AERS: 2.0000 | INHALATION_SPRAY | Freq: Every day | RESPIRATORY_TRACT | 6 refills | Status: AC

## 2019-08-06 MED ORDER — ALBUTEROL SULFATE HFA 108 (90 BASE) MCG/ACT IN AERS
2.0000 | INHALATION_SPRAY | Freq: Four times a day (QID) | RESPIRATORY_TRACT | 11 refills | Status: DC | PRN
Start: 2019-08-06 — End: 2019-10-14

## 2019-08-06 NOTE — Patient Instructions (Addendum)
Thank you for visiting Dr. Valeta Harms at [facility]. Today we recommend the following: Orders Placed This Encounter  Procedures  . CT Chest Wo Contrast   Meds ordered this encounter  Medications  . albuterol (VENTOLIN HFA) 108 (90 Base) MCG/ACT inhaler    Sig: Inhale 2 puffs into the lungs every 6 (six) hours as needed for wheezing or shortness of breath.    Dispense:  18 g    Refill:  11  . Tiotropium Bromide-Olodaterol (STIOLTO RESPIMAT) 2.5-2.5 MCG/ACT AERS    Sig: Inhale 2 puffs into the lungs daily.    Dispense:  4 g    Refill:  6   Return in about 3 months (around 10/27/2019) for with APP, Eric Form, NP or Dr. Valeta Harms .    Please do your part to reduce the spread of COVID-19.

## 2019-08-06 NOTE — Progress Notes (Signed)
Synopsis: Referred in August 2020 for shortness of breath by [name], MD  Subjective:   PATIENT ID: [name] GENDER: female DOB: [birth year], MRN: [id number]  Chief Complaint  Patient presents with  . Consult    Consult for possible COPD. Reports she recently had PNE. She reports SOB with exertion.     This is a 73 year old female past medical history of rheumatoid arthritis, celiac disease, CKD stage III, former smoker quit 15 years ago, 30+-pack-year history of smoking.  Lung RADS 4 a on most recent low-dose lung cancer screening.  Patient was scheduled to follow-up with me regarding abnormal lung nodule.  She does not have a current pulmonologist.  OV 08/06/2019: she has been smoking for many years, quit 16 years ago. She stopped 2 times in her life. She quit the first time in her 52s. She was enrolled LDCT and doing well.  She is concerned because she watched her sister have continued decline from COPD.  She also saw her mother died from COPD.  She is not been on any maintenance medications before.  She is only been using as needed short acting Combivent.  She does have exertional dyspnea.  She does try to exercise regularly.  She feels some shortness of breath with exercise but otherwise doing well.    Past Medical History:  Diagnosis Date  . Arthritis    Rheumatoid arthritis  . Celiac disease   . Chronic kidney disease    stage 3 from MD notes  . Dyspnea    with going up stairs  . Family history of adverse reaction to anesthesia    father had hard time waking up  . Headache    sinus headaches  . Hot flashes   . Hypertension   . Iron deficiency anemia      Family History  Problem Relation Age of Onset  . Peripheral Artery Disease Father   . Diabetes Father      Past Surgical History:  Procedure Laterality Date  . COLONOSCOPY    . ECTOPIC PREGNANCY SURGERY      x 2  . REVERSE SHOULDER ARTHROPLASTY Right 02/02/2017   Procedure: RIGHT REVERSE SHOULDER  ARTHROPLASTY;  Surgeon: Netta Cedars, MD;  Location: [place];  Service: Orthopedics;  Laterality: Right;    Social History   Socioeconomic History  . Marital status: Married    Spouse name: Not on file  . Number of children: Not on file  . Years of education: Not on file  . Highest education level: Not on file  Occupational History  . Not on file  Social Needs  . Financial resource strain: Not on file  . Food insecurity    Worry: Not on file    Inability: Not on file  . Transportation needs    Medical: Not on file    Non-medical: Not on file  Tobacco Use  . Smoking status: Former Smoker    Packs/day: 1.00    Years: 30.00    Pack years: 30.00    Types: Cigarettes    Quit date: 2005    Years since quitting: 15.6  . Smokeless tobacco: Never Used  Substance and Sexual Activity  . Alcohol use: Yes    Alcohol/week: 10.0 standard drinks    Types: 10 Glasses of wine per week  . Drug use: No  . Sexual activity: Not on file  Lifestyle  . Physical activity    Days per week: Not on file    Minutes  per session: Not on file  . Stress: Not on file  Relationships  . Social Herbalist on phone: Not on file    Gets together: Not on file    Attends religious service: Not on file    Active member of club or organization: Not on file    Attends meetings of clubs or organizations: Not on file    Relationship status: Not on file  . Intimate partner violence    Fear of current or ex partner: Not on file    Emotionally abused: Not on file    Physically abused: Not on file    Forced sexual activity: Not on file  Other Topics Concern  . Not on file  Social History Narrative  . Not on file     Allergies  Allergen Reactions  . Gluten Meal Diarrhea  . Penicillins Rash    Has patient had a PCN reaction causing immediate rash, facial/tongue/throat swelling, SOB or lightheadedness with hypotension: Yes Has patient had a PCN reaction causing severe rash involving mucus  membranes or skin necrosis: No Has patient had a PCN reaction that required hospitalization No Has patient had a PCN reaction occurring within the last 10 years: No If all of the above answers are "NO", then may proceed with Cephalosporin use.   . Alendronate Rash  . Hydroxychloroquine Itching     Outpatient Medications Prior to Visit  Medication Sig Dispense Refill  . acetaminophen (TYLENOL) 500 MG tablet Take 1,000 mg by mouth daily as needed for mild pain.     . B Complex-C (B-COMPLEX WITH VITAMIN C) tablet Take 1 tablet by mouth daily.    . cholecalciferol (VITAMIN D) 1000 UNITS tablet Take 1,000 Units by mouth daily.    Marland Kitchen gabapentin (NEURONTIN) 300 MG capsule Take 300 mg by mouth 3 (three) times daily.     . InFLIXimab (REMICADE IV) Inject 10 mg into the vein as directed. Every 2 months    . Ipratropium-Albuterol (COMBIVENT RESPIMAT) 20-100 MCG/ACT AERS respimat Inhale 2 puffs into the lungs 2 (two) times daily.     Marland Kitchen loratadine (CLARITIN) 10 MG tablet Take 5 mg by mouth daily.    Marland Kitchen losartan (COZAAR) 50 MG tablet TK 1 T PO QD    . metoprolol succinate (TOPROL-XL) 50 MG 24 hr tablet Take 50 mg by mouth every morning.     . predniSONE (DELTASONE) 5 MG tablet Take 5 mg by mouth every other day.     . pseudoephedrine-acetaminophen (TYLENOL SINUS) 30-500 MG TABS tablet Take 1 tablet by mouth daily as needed (sinus headaches).    . TURMERIC PO Take 47.5 mg by mouth daily.    . Wheat Dextrin (BENEFIBER) POWD Take 1 Dose by mouth daily.     No facility-administered medications prior to visit.     Review of Systems  Constitutional: Negative for chills, fever, malaise/fatigue and weight loss.  HENT: Negative for hearing loss, sore throat and tinnitus.   Eyes: Negative for blurred vision and double vision.  Respiratory: Positive for shortness of breath. Negative for cough, hemoptysis, sputum production, wheezing and stridor.   Cardiovascular: Negative for chest pain, palpitations,  orthopnea, leg swelling and PND.  Gastrointestinal: Negative for abdominal pain, constipation, diarrhea, heartburn, nausea and vomiting.  Genitourinary: Negative for dysuria, hematuria and urgency.  Musculoskeletal: Negative for joint pain and myalgias.  Skin: Negative for itching and rash.  Neurological: Negative for dizziness, tingling, weakness and headaches.  Endo/Heme/Allergies: Negative for environmental  allergies. Does not bruise/bleed easily.  Psychiatric/Behavioral: Negative for depression. The patient is not nervous/anxious and does not have insomnia.   All other systems reviewed and are negative.    Objective:  Physical Exam Vitals signs reviewed.  Constitutional:      General: She is not in acute distress.    Appearance: She is well-developed.  HENT:     Head: Normocephalic and atraumatic.     Mouth/Throat:     Pharynx: No oropharyngeal exudate.  Eyes:     Conjunctiva/sclera: Conjunctivae normal.     Pupils: Pupils are equal, round, and reactive to light.  Neck:     Vascular: No JVD.     Trachea: No tracheal deviation.     Comments: Loss of supraclavicular fat Cardiovascular:     Rate and Rhythm: Normal rate and regular rhythm.     Heart sounds: S1 normal and S2 normal.     Comments: Distant heart tones Pulmonary:     Effort: No tachypnea or accessory muscle usage.     Breath sounds: No stridor. Decreased breath sounds (throughout all lung fields) present. No wheezing, rhonchi or rales.  Abdominal:     General: Bowel sounds are normal. There is no distension.     Palpations: Abdomen is soft.     Tenderness: There is no abdominal tenderness.  Musculoskeletal:        General: Deformity (muscle wasting ) present.  Skin:    General: Skin is warm and dry.     Capillary Refill: Capillary refill takes less than 2 seconds.     Findings: No rash.  Neurological:     Mental Status: She is alert and oriented to person, place, and time.  Psychiatric:        Behavior:  Behavior normal.      Vitals:   08/06/19 1110  BP: (!) 162/88  Pulse: (!) 111  Temp: 98.3 F (36.8 C)  TempSrc: Oral  SpO2: 98%  Weight: 133 lb 6.4 oz (60.5 kg)  Height: 5' 3"  (1.6 m)   98% on RA BMI Readings from Last 3 Encounters:  08/06/19 23.63 kg/m  01/01/18 24.30 kg/m  01/26/17 23.52 kg/m   Wt Readings from Last 3 Encounters:  08/06/19 133 lb 6.4 oz (60.5 kg)  01/01/18 137 lb 3.2 oz (62.2 kg)  01/26/17 132 lb 12.8 oz (60.2 kg)     CBC    Component Value Date/Time   WBC 8.1 01/01/2018 1359   WBC 11.5 (H) 01/26/2017 1410   RBC 3.20 (L) 01/01/2018 1359   RBC 3.22 (L) 01/01/2018 1359   HGB 10.9 (L) 01/01/2018 1359   HGB 10.8 (L) 01/03/2017 1246   HCT 33.1 (L) 01/01/2018 1359   HCT 32.8 (L) 01/03/2017 1246   PLT 131 (L) 01/01/2018 1359   PLT 163 01/03/2017 1246   MCV 103.5 (H) 01/01/2018 1359   MCV 102.8 (H) 01/03/2017 1246   MCH 34.0 01/01/2018 1359   MCHC 32.9 01/01/2018 1359   RDW 14.8 01/01/2018 1359   RDW 14.3 01/03/2017 1246   LYMPHSABS 2.3 01/01/2018 1359   LYMPHSABS 2.9 01/03/2017 1246   MONOABS 0.6 01/01/2018 1359   MONOABS 1.1 (H) 01/03/2017 1246   EOSABS 0.0 01/01/2018 1359   EOSABS 0.1 01/03/2017 1246   BASOSABS 0.1 01/01/2018 1359   BASOSABS 0.0 01/03/2017 1246    Chest Imaging: 07/07/2019: CT lung cancer screening: Lung RADS 4 a, right lower lobe patchy opacities.  Significant emphysema.  Pulmonary Functions Testing Results: PFT Results  Latest Ref Rng & Units 01/26/2016  FVC-Pre L 2.31  FVC-Predicted Pre % 79  FVC-Post L 2.45  FVC-Predicted Post % 84  Pre FEV1/FVC % % 69  Post FEV1/FCV % % 69  FEV1-Pre L 1.60  FEV1-Predicted Pre % 72  FEV1-Post L 1.70  DLCO UNC% % 56  DLCO COR %Predicted % 70  TLC L 4.25  TLC % Predicted % 86  RV % Predicted % 84    FeNO: None   Pathology: None  Echocardiogram: None   Heart Catheterization: None     Assessment & Plan:     ICD-10-CM   1. Stage 2 moderate COPD by GOLD  classification (HCC)  J44.9 albuterol (VENTOLIN HFA) 108 (90 Base) MCG/ACT inhaler    Tiotropium Bromide-Olodaterol (STIOLTO RESPIMAT) 2.5-2.5 MCG/ACT AERS    CT Chest Wo Contrast    CANCELED: CT Chest Wo Contrast  2. Abnormal CT lung screening  R91.8 CT Chest Wo Contrast    CANCELED: CT Chest Wo Contrast    Discussion:  This is a 73 year old female with an abnormal lung cancer screening CT.  She was treated for pneumonia with inflammatory-like changes in the right lower lobe.  She does have some adjacent pleural thickening.  PFTs in the past with stage II COPD.  She has not had full PFTs since 2017.  At the time she has stage II disease.  She is currently taking as needed Combivent for her COPD management. We will give her samples today of Stiolto. We will also send prescription for Stiolto plus albuterol. She can stop the use of Combivent when she starts these 2 new medications. We will also have a repeat noncontrasted CT of the chest in 3 months to follow-up this lower lobe abnormality..  She can see myself or Judson Roch gross, NP in clinic in 3 months after the CT is complete.  Greater than 50% of this patient's 45-minute office visit was spent face-to-face discussing above recommendations and treatment plan as well as review of CT imaging, plans for follow-up and management of her COPD.  Current Outpatient Medications:  .  acetaminophen (TYLENOL) 500 MG tablet, Take 1,000 mg by mouth daily as needed for mild pain. , Disp: , Rfl:  .  B Complex-C (B-COMPLEX WITH VITAMIN C) tablet, Take 1 tablet by mouth daily., Disp: , Rfl:  .  cholecalciferol (VITAMIN D) 1000 UNITS tablet, Take 1,000 Units by mouth daily., Disp: , Rfl:  .  gabapentin (NEURONTIN) 300 MG capsule, Take 300 mg by mouth 3 (three) times daily. , Disp: , Rfl:  .  InFLIXimab (REMICADE IV), Inject 10 mg into the vein as directed. Every 2 months, Disp: , Rfl:  .  Ipratropium-Albuterol (COMBIVENT RESPIMAT) 20-100 MCG/ACT AERS  respimat, Inhale 2 puffs into the lungs 2 (two) times daily. , Disp: , Rfl:  .  loratadine (CLARITIN) 10 MG tablet, Take 5 mg by mouth daily., Disp: , Rfl:  .  losartan (COZAAR) 50 MG tablet, TK 1 T PO QD, Disp: , Rfl:  .  metoprolol succinate (TOPROL-XL) 50 MG 24 hr tablet, Take 50 mg by mouth every morning. , Disp: , Rfl:  .  predniSONE (DELTASONE) 5 MG tablet, Take 5 mg by mouth every other day. , Disp: , Rfl:  .  pseudoephedrine-acetaminophen (TYLENOL SINUS) 30-500 MG TABS tablet, Take 1 tablet by mouth daily as needed (sinus headaches)., Disp: , Rfl:  .  TURMERIC PO, Take 47.5 mg by mouth daily., Disp: , Rfl:  .  Wheat  Dextrin (BENEFIBER) POWD, Take 1 Dose by mouth daily., Disp: , Rfl:    Garner Nash, DO [facility] 08/06/2019 11:18 AM

## 2019-08-11 ENCOUNTER — Telehealth: Payer: Self-pay

## 2019-08-11 NOTE — Telephone Encounter (Signed)

## 2019-08-12 ENCOUNTER — Encounter: Payer: Self-pay | Admitting: Plastic Surgery

## 2019-08-12 ENCOUNTER — Other Ambulatory Visit: Payer: Self-pay

## 2019-08-12 ENCOUNTER — Ambulatory Visit (INDEPENDENT_AMBULATORY_CARE_PROVIDER_SITE_OTHER): Payer: Medicare Other | Admitting: Plastic Surgery

## 2019-08-12 DIAGNOSIS — H02831 Dermatochalasis of right upper eyelid: Secondary | ICD-10-CM

## 2019-08-12 DIAGNOSIS — H02834 Dermatochalasis of left upper eyelid: Secondary | ICD-10-CM

## 2019-08-12 DIAGNOSIS — H02839 Dermatochalasis of unspecified eye, unspecified eyelid: Secondary | ICD-10-CM | POA: Insufficient documentation

## 2019-08-12 NOTE — Progress Notes (Signed)
Patient ID: [name], female    DOB: 1946/05/25, 73 y.o.   MRN: [id number]   Chief Complaint  Patient presents with  . Skin Problem    The patient is a 73 year old female here for evaluation of her upper eyelids.  The patient states that it is becoming difficult for her to read without lifting her eyelids.  It is worse in the evening and worse when it is darker in the room.  This is been getting worse over the past several years she has not had any treatment to date.  She sees a eye doctor regularly and had her cataracts done.  She is 5 feet 3 inches tall and weighs 132 pounds.  She has a history of high blood pressure, neuropathy, osteoporosis and COPD.  She has a lot of laxity of her upper lids and hooding.  Her lids are resting on her pupil and it is in the morning.  She also mentioned hyperpigmentation of her cheeks.  This appears to be skin damage from sun.  She also does not like her tissue around her neck area.   Review of Systems  Constitutional: Positive for activity change. Negative for appetite change.  Eyes: Negative for discharge and redness.  Respiratory: Negative for chest tightness and shortness of breath.   Cardiovascular: Negative for leg swelling.  Endocrine: Negative.   Genitourinary: Negative.   Skin: Positive for color change. Negative for wound.  Hematological: Negative.   Psychiatric/Behavioral: Negative.     Past Medical History:  Diagnosis Date  . Arthritis    Rheumatoid arthritis  . Celiac disease   . Chronic kidney disease    stage 3 from MD notes  . Dyspnea    with going up stairs  . Family history of adverse reaction to anesthesia    father had hard time waking up  . Headache    sinus headaches  . Hot flashes   . Hypertension   . Iron deficiency anemia     Past Surgical History:  Procedure Laterality Date  . COLONOSCOPY    . ECTOPIC PREGNANCY SURGERY      x 2  . REVERSE SHOULDER ARTHROPLASTY Right 02/02/2017   Procedure: RIGHT  REVERSE SHOULDER ARTHROPLASTY;  Surgeon: Netta Cedars, MD;  Location: [place];  Service: Orthopedics;  Laterality: Right;      Current Outpatient Medications:  .  acetaminophen (TYLENOL) 500 MG tablet, Take 1,000 mg by mouth daily as needed for mild pain. , Disp: , Rfl:  .  albuterol (VENTOLIN HFA) 108 (90 Base) MCG/ACT inhaler, Inhale 2 puffs into the lungs every 6 (six) hours as needed for wheezing or shortness of breath., Disp: 18 g, Rfl: 11 .  B Complex-C (B-COMPLEX WITH VITAMIN C) tablet, Take 1 tablet by mouth daily., Disp: , Rfl:  .  cholecalciferol (VITAMIN D) 1000 UNITS tablet, Take 1,000 Units by mouth daily., Disp: , Rfl:  .  gabapentin (NEURONTIN) 300 MG capsule, Take 300 mg by mouth 3 (three) times daily. , Disp: , Rfl:  .  InFLIXimab (REMICADE IV), Inject 10 mg into the vein as directed. Every 2 months, Disp: , Rfl:  .  Ipratropium-Albuterol (COMBIVENT RESPIMAT) 20-100 MCG/ACT AERS respimat, Inhale 2 puffs into the lungs 2 (two) times daily. , Disp: , Rfl:  .  loratadine (CLARITIN) 10 MG tablet, Take 5 mg by mouth daily., Disp: , Rfl:  .  losartan (COZAAR) 50 MG tablet, TK 1 T PO QD, Disp: , Rfl:  .  metoprolol succinate (TOPROL-XL) 50 MG 24 hr tablet, Take 50 mg by mouth every morning. , Disp: , Rfl:  .  predniSONE (DELTASONE) 5 MG tablet, Take 5 mg by mouth every other day. , Disp: , Rfl:  .  pseudoephedrine-acetaminophen (TYLENOL SINUS) 30-500 MG TABS tablet, Take 1 tablet by mouth daily as needed (sinus headaches)., Disp: , Rfl:  .  Tiotropium Bromide-Olodaterol (STIOLTO RESPIMAT) 2.5-2.5 MCG/ACT AERS, Inhale 2 puffs into the lungs daily., Disp: 4 g, Rfl: 6 .  Tiotropium Bromide-Olodaterol (STIOLTO RESPIMAT) 2.5-2.5 MCG/ACT AERS, Inhale 2 puffs into the lungs daily. (Patient not taking: Reported on 08/12/2019), Disp: 1 g, Rfl: 0 .  TURMERIC PO, Take 47.5 mg by mouth daily., Disp: , Rfl:  .  Wheat Dextrin (BENEFIBER) POWD, Take 1 Dose by mouth daily., Disp: , Rfl:    Objective:    Vitals:   08/12/19 1059  BP: 128/77  Pulse: 89  Temp: 98.1 F (36.7 C)  SpO2: 96%    Physical Exam Vitals signs and nursing note reviewed.  Constitutional:      Appearance: Normal appearance.  HENT:     Head: Normocephalic and atraumatic.  Neck:     Musculoskeletal: Normal range of motion.  Cardiovascular:     Rate and Rhythm: Normal rate.     Pulses: Normal pulses.  Pulmonary:     Effort: Pulmonary effort is normal. No respiratory distress.  Abdominal:     General: Abdomen is flat. There is no distension.  Skin:    General: Skin is warm.  Neurological:     General: No focal deficit present.     Mental Status: She is alert and oriented to person, place, and time.  Psychiatric:        Mood and Affect: Mood normal.        Behavior: Behavior normal.        Thought Content: Thought content normal.     Assessment & Plan:  Dermatochalasis of both upper eyelids  Recommend bilateral upper lid blepharoplasty. Pictures were obtained of the patient and placed in the chart with the patient's or guardian's permission. Patient provided a quote for other areas of concern that are not covered under insurance.  [place], DO

## 2019-08-20 DIAGNOSIS — Z1231 Encounter for screening mammogram for malignant neoplasm of breast: Secondary | ICD-10-CM | POA: Diagnosis not present

## 2019-08-20 DIAGNOSIS — Z803 Family history of malignant neoplasm of breast: Secondary | ICD-10-CM | POA: Diagnosis not present

## 2019-09-06 DIAGNOSIS — Z23 Encounter for immunization: Secondary | ICD-10-CM | POA: Diagnosis not present

## 2019-09-16 DIAGNOSIS — M0579 Rheumatoid arthritis with rheumatoid factor of multiple sites without organ or systems involvement: Secondary | ICD-10-CM | POA: Diagnosis not present

## 2019-09-25 DIAGNOSIS — D693 Immune thrombocytopenic purpura: Secondary | ICD-10-CM | POA: Diagnosis not present

## 2019-09-25 DIAGNOSIS — J449 Chronic obstructive pulmonary disease, unspecified: Secondary | ICD-10-CM | POA: Diagnosis not present

## 2019-09-25 DIAGNOSIS — K900 Celiac disease: Secondary | ICD-10-CM | POA: Diagnosis not present

## 2019-09-25 DIAGNOSIS — E7800 Pure hypercholesterolemia, unspecified: Secondary | ICD-10-CM | POA: Diagnosis not present

## 2019-09-25 DIAGNOSIS — I129 Hypertensive chronic kidney disease with stage 1 through stage 4 chronic kidney disease, or unspecified chronic kidney disease: Secondary | ICD-10-CM | POA: Diagnosis not present

## 2019-09-25 DIAGNOSIS — D509 Iron deficiency anemia, unspecified: Secondary | ICD-10-CM | POA: Diagnosis not present

## 2019-09-25 DIAGNOSIS — M069 Rheumatoid arthritis, unspecified: Secondary | ICD-10-CM | POA: Diagnosis not present

## 2019-09-25 DIAGNOSIS — M353 Polymyalgia rheumatica: Secondary | ICD-10-CM | POA: Diagnosis not present

## 2019-09-25 DIAGNOSIS — M810 Age-related osteoporosis without current pathological fracture: Secondary | ICD-10-CM | POA: Diagnosis not present

## 2019-09-25 DIAGNOSIS — Z1159 Encounter for screening for other viral diseases: Secondary | ICD-10-CM | POA: Diagnosis not present

## 2019-09-25 DIAGNOSIS — Z1389 Encounter for screening for other disorder: Secondary | ICD-10-CM | POA: Diagnosis not present

## 2019-09-25 DIAGNOSIS — Z0000 Encounter for general adult medical examination without abnormal findings: Secondary | ICD-10-CM | POA: Diagnosis not present

## 2019-09-26 DIAGNOSIS — Z1211 Encounter for screening for malignant neoplasm of colon: Secondary | ICD-10-CM | POA: Diagnosis not present

## 2019-09-26 DIAGNOSIS — M810 Age-related osteoporosis without current pathological fracture: Secondary | ICD-10-CM | POA: Diagnosis not present

## 2019-09-26 DIAGNOSIS — Z1159 Encounter for screening for other viral diseases: Secondary | ICD-10-CM | POA: Diagnosis not present

## 2019-09-26 DIAGNOSIS — E7800 Pure hypercholesterolemia, unspecified: Secondary | ICD-10-CM | POA: Diagnosis not present

## 2019-10-02 DIAGNOSIS — Z1159 Encounter for screening for other viral diseases: Secondary | ICD-10-CM | POA: Diagnosis not present

## 2019-10-08 ENCOUNTER — Ambulatory Visit (INDEPENDENT_AMBULATORY_CARE_PROVIDER_SITE_OTHER)
Admission: RE | Admit: 2019-10-08 | Discharge: 2019-10-08 | Disposition: A | Discharge status: Home / Self Care | Payer: Medicare Other | Source: Ambulatory Visit | Attending: Acute Care | Admitting: Acute Care

## 2019-10-08 ENCOUNTER — Other Ambulatory Visit: Payer: Self-pay

## 2019-10-08 DIAGNOSIS — R918 Other nonspecific abnormal finding of lung field: Secondary | ICD-10-CM

## 2019-10-08 DIAGNOSIS — Z87891 Personal history of nicotine dependence: Secondary | ICD-10-CM

## 2019-10-08 DIAGNOSIS — Z122 Encounter for screening for malignant neoplasm of respiratory organs: Secondary | ICD-10-CM

## 2019-10-08 DIAGNOSIS — J439 Emphysema, unspecified: Secondary | ICD-10-CM | POA: Diagnosis not present

## 2019-10-09 DIAGNOSIS — R768 Other specified abnormal immunological findings in serum: Secondary | ICD-10-CM | POA: Diagnosis not present

## 2019-10-13 ENCOUNTER — Other Ambulatory Visit: Payer: Self-pay | Admitting: *Deleted

## 2019-10-13 DIAGNOSIS — Z9225 Personal history of immunosupression therapy: Secondary | ICD-10-CM | POA: Diagnosis not present

## 2019-10-13 DIAGNOSIS — D849 Immunodeficiency, unspecified: Secondary | ICD-10-CM | POA: Diagnosis not present

## 2019-10-13 DIAGNOSIS — R768 Other specified abnormal immunological findings in serum: Secondary | ICD-10-CM | POA: Diagnosis not present

## 2019-10-14 ENCOUNTER — Ambulatory Visit (INDEPENDENT_AMBULATORY_CARE_PROVIDER_SITE_OTHER): Payer: Medicare Other | Admitting: Nurse Practitioner

## 2019-10-14 ENCOUNTER — Encounter: Payer: Self-pay | Admitting: Nurse Practitioner

## 2019-10-14 ENCOUNTER — Other Ambulatory Visit: Payer: Self-pay

## 2019-10-14 VITALS — BP 156/75 | HR 85 | Temp 97.8°F | Ht 62.0 in | Wt 134.6 lb

## 2019-10-14 DIAGNOSIS — H02831 Dermatochalasis of right upper eyelid: Secondary | ICD-10-CM

## 2019-10-14 DIAGNOSIS — H02834 Dermatochalasis of left upper eyelid: Secondary | ICD-10-CM

## 2019-10-14 MED ORDER — HYDROCODONE-ACETAMINOPHEN 5-325 MG PO TABS: 1.0000 | ORAL_TABLET | Freq: Four times a day (QID) | ORAL | 0 refills | Status: AC | PRN

## 2019-10-14 NOTE — Progress Notes (Signed)
C.C.: pre-op visit  History of Present Illness: [name] is a 73 y.o.  female  with a history of dermatochalasis of both upper eyelids.  She presents for preoperative evaluation for upcoming procedure, bilateral upper lip blepharoplasty, scheduled for 10/27/19 with Dr. Marla Roe. Patient states she has difficulty seeing as a result of her eyelids.   Past medical history is significant for CKD stage 3, COPD stage 2, former smoker quit 15 years ago, Rheumatoid arthritis, HTN, celiac disease, and iron deficiency anemia. Patient states she has shortness of breath with certain exercises and walking up stairs. Patient is followed by [facility] and was prescribed daily inhalation medications in August 2020. Patient states her shortness of breath has improved since starting these medications. Previous surgical history includes; colonoscopy, ectopic pregnancy x2, and reverse shoulder arthroplasty. Patient denies any adverse reactions to anesthesia. Patient's father had " a hard time waking up" from surgery. Patient denies personal or family history of blood clots. Patient does not take any anticoagulants.   Patient's husband will assist on the day of surgery.    Past Medical History: Allergies: Allergies  Allergen Reactions  . Gluten Meal Diarrhea  . Penicillins Rash    Has patient had a PCN reaction causing immediate rash, facial/tongue/throat swelling, SOB or lightheadedness with hypotension: Yes Has patient had a PCN reaction causing severe rash involving mucus membranes or skin necrosis: No Has patient had a PCN reaction that required hospitalization No Has patient had a PCN reaction occurring within the last 10 years: No If all of the above answers are "NO", then may proceed with Cephalosporin use.   . Alendronate Rash  . Hydroxychloroquine Itching    Current Medications:  Current Outpatient Medications:  .  acetaminophen (TYLENOL) 500 MG tablet, Take 1,000 mg by mouth daily  as needed for mild pain. , Disp: , Rfl:  .  B Complex-C (B-COMPLEX WITH VITAMIN C) tablet, Take 1 tablet by mouth daily., Disp: , Rfl:  .  cholecalciferol (VITAMIN D) 1000 UNITS tablet, Take 1,000 Units by mouth daily., Disp: , Rfl:  .  gabapentin (NEURONTIN) 300 MG capsule, Take 300 mg by mouth 3 (three) times daily. , Disp: , Rfl:  .  InFLIXimab (REMICADE IV), Inject 10 mg into the vein as directed. Every 2 months, Disp: , Rfl:  .  loratadine (CLARITIN) 10 MG tablet, Take 5 mg by mouth daily., Disp: , Rfl:  .  losartan (COZAAR) 50 MG tablet, TK 1 T PO QD, Disp: , Rfl:  .  metoprolol succinate (TOPROL-XL) 50 MG 24 hr tablet, Take 50 mg by mouth every morning. , Disp: , Rfl:  .  predniSONE (DELTASONE) 5 MG tablet, Take 5 mg by mouth every other day. , Disp: , Rfl:  .  pseudoephedrine-acetaminophen (TYLENOL SINUS) 30-500 MG TABS tablet, Take 1 tablet by mouth daily as needed (sinus headaches)., Disp: , Rfl:  .  Tiotropium Bromide-Olodaterol (STIOLTO RESPIMAT) 2.5-2.5 MCG/ACT AERS, Inhale 2 puffs into the lungs daily., Disp: 4 g, Rfl: 6 .  TURMERIC PO, Take 47.5 mg by mouth daily., Disp: , Rfl:  .  Wheat Dextrin (BENEFIBER) POWD, Take 1 Dose by mouth daily., Disp: , Rfl:   Past Medical Problems: Past Medical History:  Diagnosis Date  . Arthritis    Rheumatoid arthritis  . Celiac disease   . Chronic kidney disease    stage 3 from MD notes  . Dyspnea    with going up stairs  . Family history of adverse reaction  to anesthesia    father had hard time waking up  . Headache    sinus headaches  . Hot flashes   . Hypertension   . Iron deficiency anemia     Past Surgical History: Past Surgical History:  Procedure Laterality Date  . COLONOSCOPY    . ECTOPIC PREGNANCY SURGERY      x 2  . REVERSE SHOULDER ARTHROPLASTY Right 02/02/2017   Procedure: RIGHT REVERSE SHOULDER ARTHROPLASTY;  Surgeon: Netta Cedars, MD;  Location: [facility];  Service: Orthopedics;  Laterality: Right;    The patient HAS  had anesthesia or sedation in the past.   The patient has NOT had problems with anesthesia.  The patient DOES have a family history of anesthesia problems. Father difficult to wake after surgery  Social History: Social History   Socioeconomic History  . Marital status: Married    Spouse name: Not on file  . Number of children: Not on file  . Years of education: Not on file  . Highest education level: Not on file  Occupational History  . Not on file  Social Needs  . Financial resource strain: Not on file  . Food insecurity    Worry: Not on file    Inability: Not on file  . Transportation needs    Medical: Not on file    Non-medical: Not on file  Tobacco Use  . Smoking status: Former Smoker    Packs/day: 1.00    Years: 30.00    Pack years: 30.00    Types: Cigarettes    Quit date: 2005    Years since quitting: 15.8  . Smokeless tobacco: Never Used  Substance and Sexual Activity  . Alcohol use: Yes    Alcohol/week: 10.0 standard drinks    Types: 10 Glasses of wine per week  . Drug use: No  . Sexual activity: Not on file  Lifestyle  . Physical activity    Days per week: Not on file    Minutes per session: Not on file  . Stress: Not on file  Relationships  . Social Herbalist on phone: Not on file    Gets together: Not on file    Attends religious service: Not on file    Active member of club or organization: Not on file    Attends meetings of clubs or organizations: Not on file    Relationship status: Not on file  . Intimate partner violence    Fear of current or ex partner: Not on file    Emotionally abused: Not on file    Physically abused: Not on file    Forced sexual activity: Not on file  Other Topics Concern  . Not on file  Social History Narrative  . Not on file    Family History: Family History  Problem Relation Age of Onset  . Peripheral Artery Disease Father   . Diabetes Father     Review of Systems: General ROS:  negative Dermatological ROS: negative Cardiovascular ROS: no chest pain Lungs: mild dyspnea on exertion ENT ROS: negative Gastrointestinal ROS: negative  Physical Exam: Vital Signs BP (!) 156/75 (BP Location: Left Arm, Patient Position: Sitting, Cuff Size: Normal)   Pulse 85   Temp 97.8 F (36.6 C) (Temporal)   Ht 5' 2"  (1.575 m)   Wt 134 lb 9.6 oz (61.1 kg)   SpO2 95%   BMI 24.62 kg/m  General: awake, alert, appears stated age, no acute distress HEENT: excess skin on  bilateral upper eyelids Neck: supple, full ROM Chest: symmetrical rise and fall Cardiac: regular rate and rhythm, +2 radial pulses bilaterally, +2 pedal pulses bilaterally Lungs: CTA throughout, no wheezes, rales, or rhonchi Abdomen: soft, non-distended, non-tender Musculoskeletal: MAE x4 Neuro: A&Ox3 Skin: no skin lesions or abnormalities  Assessment: 73 y.o.female  with a history of dermatochalasis of both upper eyelids. She has several chronic conditions that are well managed.   Caprini score=4, patient will require mechanical prophylaxis during surgery   Plan: Patient is scheduled for bilateral upper lip blepharoplasty on 10/27/19 with Dr. Marla Roe. Risks, benefits, and alternatives of procedure discussed, expected recovery, and questions answered. Discussed importance of increased protein and decreased carb and sugar intake for wound healing.   The risks that can be encountered with and after a blepharoplasty were discussed and include the following but no limited to these:  Asymmetry, dry eyes, lid lag, sensitivity to sun or bright light, difficulty closing your eyes, outward rolling of the eyelid, change in vision, fluid accumulation, firmness of the area, fat necrosis with death of fat tissue, bleeding, infection, delayed healing, anesthesia risks, skin sensation changes, injury to structures including nerves, blood vessels, and muscles which may be temporary or permanent, hair loss, allergies to tape,  suture materials and glues, blood products, topical preparations or injected agents, skin and contour irregularities, skin discoloration and swelling, deep vein thrombosis, cardiac and pulmonary complications, pain, which may persist, persistent pain, recurrence, poor healing of the incision, possible need for revisional surgery or staged procedures. Thiere can also be persistent swelling, poor wound healing, rippling or loose skin, swelling. Any change in weight fluctuations can alter the outcome.   COVID-19 pre-procedure test scheduled for 10/23/19. Post-op visit scheduled for 11/14/19.    Electronically signed by: Alfredo Batty, NP 10/14/2019 1:48 PM

## 2019-10-21 NOTE — Progress Notes (Signed)
[id number] [address], [address] [place] [address] Phone: [phone] Fax: [phone]      Your procedure is scheduled on October 27, 2019.  Report to [facility] Main Entrance "A" at 05:30 A.M., and check in at the Admitting office.  Call this number if you have problems the morning of surgery:  [phone]  Call [phone] if you have any questions prior to your surgery date Monday-Friday 8am-4pm    Remember:  Do not eat or drink after midnight the night before your surgery     Take these medicines the morning of surgery with A SIP OF WATER : Gabapentin (Neurontin) Acetaminophen (Tylenol) if needed Loratadine (Claritin) Metoprolol Succinate (Toprol-XL) Prednisone (Deltasone) Tiotropium Bromide-Olodaterol (Stiolto Respimat)  7 days prior to surgery STOP taking any Aspirin (unless otherwise instructed by your surgeon), Pseudoephedrine-Acetaminophen (Tylenol Sinus) Aleve, Naproxen, Ibuprofen, Motrin, Advil, Goody's, BC's, all herbal medications, fish oil, and all vitamins.    The Morning of Surgery  Do not wear jewelry, make-up or nail polish.  Do not wear lotions, powders, or perfumes/colognes, or deodorant  Do not shave 48 hours prior to surgery.  Do not bring valuables to the hospital.  [facility] is not responsible for any belongings or valuables.  If you are a smoker, DO NOT Smoke 24 hours prior to surgery  IF you wear a CPAP at night please bring your mask, tubing, and machine the morning of surgery   Remember that you must have someone to transport you home after your surgery, and remain with you for 24 hours if you are discharged the same day.   Contacts, glasses, hearing aids, dentures or bridgework may not be worn into surgery.    Leave your suitcase in the car.  After surgery it may be brought to your room.  For patients admitted to the hospital,  discharge time will be determined by your treatment team.  Patients discharged the day of surgery will not be allowed to drive home.    Special instructions:   [facility]- Preparing For Surgery  Before surgery, you can play an important role. Because skin is not sterile, your skin needs to be as free of germs as possible. You can reduce the number of germs on your skin by washing with CHG (chlorahexidine gluconate) Soap before surgery.  CHG is an antiseptic cleaner which kills germs and bonds with the skin to continue killing germs even after washing.    Oral Hygiene is also important to reduce your risk of infection.  Remember - BRUSH YOUR TEETH THE MORNING OF SURGERY WITH YOUR REGULAR TOOTHPASTE  Please do not use if you have an allergy to CHG or antibacterial soaps. If your skin becomes reddened/irritated stop using the CHG.  Do not shave (including legs and underarms) for at least 48 hours prior to first CHG shower. It is OK to shave your face.  Please follow these instructions carefully.   1. Shower the NIGHT BEFORE SURGERY and the MORNING OF SURGERY with CHG Soap.   2. If you chose to wash your hair, wash your hair first as usual with your normal shampoo.  3. After you shampoo, rinse your hair and body thoroughly to remove the shampoo.  4. Use CHG as you would any other liquid soap. You can apply CHG directly to the skin and wash gently with a scrungie or a clean washcloth.  5. Apply the CHG Soap to your body ONLY FROM THE NECK DOWN.  Do not use on open wounds or open sores. Avoid contact with your eyes, ears, mouth and genitals (private parts). Wash Face and genitals (private parts)  with your normal soap.   6. Wash thoroughly, paying special attention to the area where your surgery will be performed.  7. Thoroughly rinse your body with warm water from the neck down.  8. DO NOT shower/wash with your normal soap after using and rinsing off the CHG Soap.  9. Pat yourself dry  with a CLEAN TOWEL.  10. Wear CLEAN PAJAMAS to bed the night before surgery, wear comfortable clothes the morning of surgery  11. Place CLEAN SHEETS on your bed the night of your first shower and DO NOT SLEEP WITH PETS.    Day of Surgery:  Do not apply any deodorants/lotions. Please shower the morning of surgery with the CHG soap  Please wear clean clothes to the hospital/surgery center.   Remember to brush your teeth WITH YOUR REGULAR TOOTHPASTE.   Please read over the following fact sheets that you were given.

## 2019-10-22 ENCOUNTER — Other Ambulatory Visit: Payer: Self-pay

## 2019-10-22 ENCOUNTER — Encounter (HOSPITAL_COMMUNITY)
Admission: RE | Admit: 2019-10-22 | Discharge: 2019-10-22 | Disposition: A | Discharge status: Home / Self Care | Payer: Medicare Other | Source: Ambulatory Visit | Attending: Plastic Surgery | Admitting: Plastic Surgery

## 2019-10-22 ENCOUNTER — Encounter (HOSPITAL_COMMUNITY): Payer: Self-pay

## 2019-10-22 DIAGNOSIS — B1920 Unspecified viral hepatitis C without hepatic coma: Secondary | ICD-10-CM | POA: Diagnosis not present

## 2019-10-22 DIAGNOSIS — I129 Hypertensive chronic kidney disease with stage 1 through stage 4 chronic kidney disease, or unspecified chronic kidney disease: Secondary | ICD-10-CM | POA: Diagnosis not present

## 2019-10-22 DIAGNOSIS — N183 Chronic kidney disease, stage 3 unspecified: Secondary | ICD-10-CM | POA: Diagnosis not present

## 2019-10-22 DIAGNOSIS — Z01818 Encounter for other preprocedural examination: Secondary | ICD-10-CM | POA: Diagnosis not present

## 2019-10-22 DIAGNOSIS — J449 Chronic obstructive pulmonary disease, unspecified: Secondary | ICD-10-CM | POA: Diagnosis not present

## 2019-10-22 DIAGNOSIS — D649 Anemia, unspecified: Secondary | ICD-10-CM | POA: Diagnosis not present

## 2019-10-22 HISTORY — DX: Chronic obstructive pulmonary disease, unspecified: J44.9

## 2019-10-22 HISTORY — DX: Pneumonia, unspecified organism: J18.9

## 2019-10-22 LAB — BASIC METABOLIC PANEL
Anion gap: 12 (ref 5–15)
BUN: 16 mg/dL (ref 8–23)
CO2: 22 mmol/L (ref 22–32)
Calcium: 9.8 mg/dL (ref 8.9–10.3)
Chloride: 97 mmol/L — ABNORMAL LOW (ref 98–111)
Creatinine, Ser: 1.42 mg/dL — ABNORMAL HIGH (ref 0.44–1.00)
GFR calc Af Amer: 43 mL/min — ABNORMAL LOW (ref >60)
GFR calc non Af Amer: 37 mL/min — ABNORMAL LOW (ref >60)
Glucose, Bld: 104 mg/dL — ABNORMAL HIGH (ref 70–99)
Potassium: 4.4 mmol/L (ref 3.5–5.1)
Sodium: 131 mmol/L — ABNORMAL LOW (ref 135–145)

## 2019-10-22 LAB — CBC
HCT: 29.4 % — ABNORMAL LOW (ref 36.0–46.0)
Hemoglobin: 9.6 g/dL — ABNORMAL LOW (ref 12.0–15.0)
MCH: 35.4 pg — ABNORMAL HIGH (ref 26.0–34.0)
MCHC: 32.7 g/dL (ref 30.0–36.0)
MCV: 108.5 fL — ABNORMAL HIGH (ref 80.0–100.0)
Platelets: 113 10*3/uL — ABNORMAL LOW (ref 150–400)
RBC: 2.71 MIL/uL — ABNORMAL LOW (ref 3.87–5.11)
RDW: 15.3 % (ref 11.5–15.5)
WBC: 9.1 10*3/uL (ref 4.0–10.5)
nRBC: 0 % (ref 0.0–0.2)

## 2019-10-22 NOTE — Progress Notes (Signed)
PCP - Dr. Laurann Montana Cardiologist - patient denies  PPM/ICD - n/a Device Orders -  Rep Notified -   Chest x-ray - n/a EKG - 10/22/2019 Stress Test - patient denies ECHO - patient denies Cardiac Cath - patient denies  Sleep Study - patient denies CPAP -   Fasting Blood Sugar - n/a Checks Blood Sugar _____ times a day  Blood Thinner Instructions: n/a Aspirin Instructions:n/a  ERAS Protcol - n/a PRE-SURGERY Ensure or G2-   COVID TEST- scheduled for testing on 10/23/2019   Anesthesia review: n/a  Patient denies shortness of breath, fever, cough and chest pain at PAT appointment   All instructions explained to the patient, with a verbal understanding of the material. Patient agrees to go over the instructions while at home for a better understanding. Patient also instructed to self quarantine after being tested for COVID-19. The opportunity to ask questions was provided.

## 2019-10-23 ENCOUNTER — Telehealth: Payer: Self-pay | Admitting: Plastic Surgery

## 2019-10-23 ENCOUNTER — Inpatient Hospital Stay (HOSPITAL_COMMUNITY): Admission: RE | Admit: 2019-10-23 | Payer: Medicare Other | Source: Ambulatory Visit

## 2019-10-23 ENCOUNTER — Encounter (HOSPITAL_COMMUNITY): Payer: Self-pay | Admitting: Physician Assistant

## 2019-10-23 LAB — HEPATIC FUNCTION PANEL
ALT: 20 U/L (ref 0–44)
AST: 34 U/L (ref 15–41)
Albumin: 4.1 g/dL (ref 3.5–5.0)
Alkaline Phosphatase: 36 U/L — ABNORMAL LOW (ref 38–126)
Bilirubin, Direct: 0.2 mg/dL (ref 0.0–0.2)
Indirect Bilirubin: 0.7 mg/dL (ref 0.3–0.9)
Total Bilirubin: 0.9 mg/dL (ref 0.3–1.2)
Total Protein: 6.7 g/dL (ref 6.5–8.1)

## 2019-10-23 NOTE — Telephone Encounter (Signed)
Also, please advise patient that in regards to her covid test, she should go the testing site during the hours they told her. She would not have to make another appt.

## 2019-10-23 NOTE — Progress Notes (Signed)
Anesthesia Chart Review: Stable CKDIII, preop creatinine 1.42. Chronic anemia, baseline Hgb ~10.5. Hgb 9.6 on preop labs. LFTs WNL.  Recent diagnosis of Hepatitis C, positive Hep C antibodies, no detectable HCVRNA, follows at [facility].  Follows with Dr. Valeta Harms for stage II COPD, last seen 08/06/19.  EKG 10/22/19: Normal sinus rhythm. Rate 87. Normal ECG.   [facility]ynonia Musty [facility] Short Stay Center/Anesthesiology Phone [phone] 10/23/2019 1:14 PM

## 2019-10-23 NOTE — Anesthesia Preprocedure Evaluation (Deleted)
Anesthesia Evaluation    Airway        Dental   Pulmonary former smoker,           Cardiovascular hypertension,      Neuro/Psych    GI/Hepatic   Endo/Other    Renal/GU      Musculoskeletal   Abdominal   Peds  Hematology   Anesthesia Other Findings   Reproductive/Obstetrics                             Anesthesia Physical Anesthesia Plan  ASA:   Anesthesia Plan:    Post-op Pain Management:    Induction:   PONV Risk Score and Plan:   Airway Management Planned:   Additional Equipment:   Intra-op Plan:   Post-operative Plan:   Informed Consent:   Plan Discussed with:   Anesthesia Plan Comments: (Stable CKDIII, preop creatinine 1.42. Chronic anemia, baseline Hgb ~10.5. Hgb 9.6 on preop labs. LFTs WNL.  Recent diagnosis of Hepatitis C, positive Hep C antibodies, no detectable HCVRNA, follows at [facility].  Follows with Dr. Valeta Harms for stage II COPD, last seen 08/06/19.  EKG 10/22/19: Normal sinus rhythm. Rate 87. Normal ECG.   )       Anesthesia Quick Evaluation

## 2019-10-23 NOTE — Telephone Encounter (Signed)
[name] called to say that losarten was left off of her med list for her pre op and she wanted to know if she should avoid taking it prior to surgery or if it was just an oversight on the form. Please call her back to advise if it's safe to take this medication.

## 2019-10-24 ENCOUNTER — Other Ambulatory Visit (HOSPITAL_COMMUNITY)
Admission: RE | Admit: 2019-10-24 | Discharge: 2019-10-24 | Disposition: A | Discharge status: Home / Self Care | Payer: Medicare Other | Source: Ambulatory Visit | Attending: Plastic Surgery | Admitting: Plastic Surgery

## 2019-10-24 DIAGNOSIS — Z20828 Contact with and (suspected) exposure to other viral communicable diseases: Secondary | ICD-10-CM | POA: Diagnosis not present

## 2019-10-24 DIAGNOSIS — Z01812 Encounter for preprocedural laboratory examination: Secondary | ICD-10-CM | POA: Insufficient documentation

## 2019-10-25 LAB — NOVEL CORONAVIRUS, NAA (HOSP ORDER, SEND-OUT TO REF LAB; TAT 18-24 HRS): SARS-CoV-2, NAA: NOT DETECTED

## 2019-10-25 NOTE — Progress Notes (Signed)
Patient states she wants to cancel surgery. I contacted Dr. Marla Roe and case will be cancelled.

## 2019-10-27 ENCOUNTER — Ambulatory Visit (HOSPITAL_COMMUNITY): Admission: RE | Admit: 2019-10-27 | Payer: Medicare Other | Source: Home / Self Care | Admitting: Plastic Surgery

## 2019-10-27 SURGERY — BLEPHAROPLASTY
Anesthesia: General | Site: Eye | Laterality: Bilateral

## 2019-10-27 NOTE — Telephone Encounter (Addendum)
Patient's surgery was canceled for today.//AB/CMA

## 2019-11-04 ENCOUNTER — Encounter: Payer: Medicare Other | Admitting: Plastic Surgery

## 2019-11-06 ENCOUNTER — Ambulatory Visit: Payer: Medicare Other | Admitting: Pulmonary Disease

## 2019-11-09 DIAGNOSIS — S81812A Laceration without foreign body, left lower leg, initial encounter: Secondary | ICD-10-CM | POA: Diagnosis not present

## 2019-11-11 DIAGNOSIS — M0579 Rheumatoid arthritis with rheumatoid factor of multiple sites without organ or systems involvement: Secondary | ICD-10-CM | POA: Diagnosis not present

## 2019-11-12 ENCOUNTER — Encounter: Payer: Self-pay | Admitting: Pulmonary Disease

## 2019-11-12 ENCOUNTER — Ambulatory Visit (INDEPENDENT_AMBULATORY_CARE_PROVIDER_SITE_OTHER): Payer: Medicare Other | Admitting: Pulmonary Disease

## 2019-11-12 ENCOUNTER — Other Ambulatory Visit: Payer: Self-pay

## 2019-11-12 VITALS — BP 128/80 | HR 104 | Temp 98.4°F | Ht 63.0 in | Wt 132.0 lb

## 2019-11-12 DIAGNOSIS — M05741 Rheumatoid arthritis with rheumatoid factor of right hand without organ or systems involvement: Secondary | ICD-10-CM

## 2019-11-12 DIAGNOSIS — J849 Interstitial pulmonary disease, unspecified: Secondary | ICD-10-CM | POA: Diagnosis not present

## 2019-11-12 DIAGNOSIS — R0602 Shortness of breath: Secondary | ICD-10-CM

## 2019-11-12 DIAGNOSIS — J449 Chronic obstructive pulmonary disease, unspecified: Secondary | ICD-10-CM | POA: Diagnosis not present

## 2019-11-12 DIAGNOSIS — M05742 Rheumatoid arthritis with rheumatoid factor of left hand without organ or systems involvement: Secondary | ICD-10-CM | POA: Diagnosis not present

## 2019-11-12 DIAGNOSIS — Z79899 Other long term (current) drug therapy: Secondary | ICD-10-CM | POA: Diagnosis not present

## 2019-11-12 DIAGNOSIS — Z79620 Long term (current) use of immunosuppressive biologic: Secondary | ICD-10-CM

## 2019-11-12 NOTE — Progress Notes (Signed)
Synopsis: Referred in August 2020 for shortness of breath by [name], MD  Subjective:   PATIENT ID: [name] GENDER: female DOB: 1946/05/15, MRN: [id number]  Chief Complaint  Patient presents with  . Follow-up    Patient is here for follow up visit reguarding CT results and spot on lung    This is a 73 year old female past medical history of rheumatoid arthritis + remicade q8weeks, celiac disease, CKD stage III, former smoker quit 15 years ago, 30+-pack-year history of smoking, COPD Moderate GOLD 2, 77% predicted, 1.7L.  Lung RADS 4 a on most recent low-dose lung cancer screening.  Patient was scheduled to follow-up with me regarding abnormal lung nodule.  Follows with [facility].  OV 08/06/2019: she has been smoking for many years, quit 16 years ago. She stopped 2 times in her life. She quit the first time in her 12s. She was enrolled LDCT and doing well.  She is concerned because she watched her sister have continued decline from COPD.  She also saw her mother died from COPD.  She is not been on any maintenance medications before.  She is only been using as needed short acting Combivent.  She does have exertional dyspnea.  She does try to exercise regularly.  She feels some shortness of breath with exercise but otherwise doing well.  OV 11/12/2019: Low-dose lung cancer screening CT completed in October.  Lung nodule followed in the lingula has resolved. Overall, she is doing well today. Breathlessness is stable.  Overall doing well today.  She does feels her breathing is stable.  Today in the office we reviewed her CT imaging.  Her joints in her hands tend to bother her towards the last few weeks before its time to get her next Remicade injection.  She is currently taking prednisone 5 mg every other day.  She has been working with her rheumatologist to try to come off of this if at all possible.  Currently doing well with her inhaler regimen.  And glad that she was  able to switch.  Husband is currently out of town on a fishing/hunting trip in the [place].    Past Medical History:  Diagnosis Date  . Arthritis    Rheumatoid arthritis  . Celiac disease   . Chronic kidney disease    stage 3 from MD notes  . COPD (chronic obstructive pulmonary disease) ([place])   . Dyspnea    with going up stairs  . Family history of adverse reaction to anesthesia    father had hard time waking up  . Headache    sinus headaches  . Hot flashes   . Hypertension   . Iron deficiency anemia   . Pneumonia    per patient "I have walking pneumonia"     Family History  Problem Relation Age of Onset  . Peripheral Artery Disease Father   . Diabetes Father      Past Surgical History:  Procedure Laterality Date  . COLONOSCOPY    . ECTOPIC PREGNANCY SURGERY      x 2  . REVERSE SHOULDER ARTHROPLASTY Right 02/02/2017   Procedure: RIGHT REVERSE SHOULDER ARTHROPLASTY;  Surgeon: Netta Cedars, MD;  Location: [facility];  Service: Orthopedics;  Laterality: Right;    Social History   Socioeconomic History  . Marital status: Married    Spouse name: Not on file  . Number of children: Not on file  . Years of education: Not on file  . Highest education level:  Not on file  Occupational History  . Not on file  Social Needs  . Financial resource strain: Not on file  . Food insecurity    Worry: Not on file    Inability: Not on file  . Transportation needs    Medical: Not on file    Non-medical: Not on file  Tobacco Use  . Smoking status: Former Smoker    Packs/day: 1.00    Years: 30.00    Pack years: 30.00    Types: Cigarettes    Quit date: 2005    Years since quitting: 15.8  . Smokeless tobacco: Never Used  Substance and Sexual Activity  . Alcohol use: Yes    Alcohol/week: 12.0 - 14.0 standard drinks    Types: 12 - 14 Glasses of wine per week  . Drug use: No  . Sexual activity: Not on file  Lifestyle  . Physical activity    Days per week: Not on file    Minutes  per session: Not on file  . Stress: Not on file  Relationships  . Social Herbalist on phone: Not on file    Gets together: Not on file    Attends religious service: Not on file    Active member of club or organization: Not on file    Attends meetings of clubs or organizations: Not on file    Relationship status: Not on file  . Intimate partner violence    Fear of current or ex partner: Not on file    Emotionally abused: Not on file    Physically abused: Not on file    Forced sexual activity: Not on file  Other Topics Concern  . Not on file  Social History Narrative  . Not on file     Allergies  Allergen Reactions  . Gluten Meal Diarrhea  . Penicillins Rash    Has patient had a PCN reaction causing immediate rash, facial/tongue/throat swelling, SOB or lightheadedness with hypotension: Yes Has patient had a PCN reaction causing severe rash involving mucus membranes or skin necrosis: No Has patient had a PCN reaction that required hospitalization No Has patient had a PCN reaction occurring within the last 10 years: No If all of the above answers are "NO", then may proceed with Cephalosporin use.   . Alendronate Rash  . Hydroxychloroquine Itching     Outpatient Medications Prior to Visit  Medication Sig Dispense Refill  . acetaminophen (TYLENOL) 500 MG tablet Take 1,000 mg by mouth daily as needed for mild pain.     . B Complex-C (B-COMPLEX WITH VITAMIN C) tablet Take 1 tablet by mouth daily.    . Calcium Carb-Cholecalciferol (CALCIUM-VITAMIN D) 600-400 MG-UNIT TABS Take 2 tablets by mouth daily.    . cholecalciferol (VITAMIN D) 1000 UNITS tablet Take 1,000 Units by mouth daily.    Marland Kitchen denosumab (PROLIA) 60 MG/ML SOSY injection Inject 60 mg into the skin every 6 (six) months.    . gabapentin (NEURONTIN) 300 MG capsule Take 300 mg by mouth 3 (three) times daily.     . InFLIXimab (REMICADE IV) Inject 10 mg into the vein as directed. Every 2 months    . loratadine  (CLARITIN) 10 MG tablet Take 5 mg by mouth daily.    Marland Kitchen losartan (COZAAR) 50 MG tablet Take 50 mg by mouth daily.     . metoprolol succinate (TOPROL-XL) 50 MG 24 hr tablet Take 50 mg by mouth every morning.     Marland Kitchen  predniSONE (DELTASONE) 5 MG tablet Take 5 mg by mouth every other day.     . pseudoephedrine-acetaminophen (TYLENOL SINUS) 30-500 MG TABS tablet Take 1 tablet by mouth daily as needed (sinus headaches).    . RUTIN-50 PO Take 50 mg by mouth daily.    . Tiotropium Bromide-Olodaterol (STIOLTO RESPIMAT) 2.5-2.5 MCG/ACT AERS Inhale 2 puffs into the lungs daily.    . vitamin C (ASCORBIC ACID) 500 MG tablet Take 500 mg by mouth daily.    . Wheat Dextrin (BENEFIBER) POWD Take 1 Dose by mouth daily.    . Turmeric 500 MG CAPS Take 500 mg by mouth daily.      No facility-administered medications prior to visit.     Review of Systems  Constitutional: Negative for chills, fever, malaise/fatigue and weight loss.  HENT: Negative for hearing loss, sore throat and tinnitus.   Eyes: Negative for blurred vision and double vision.  Respiratory: Positive for cough and shortness of breath. Negative for hemoptysis, sputum production, wheezing and stridor.   Cardiovascular: Negative for chest pain, palpitations, orthopnea, leg swelling and PND.  Gastrointestinal: Negative for abdominal pain, constipation, diarrhea, heartburn, nausea and vomiting.  Genitourinary: Negative for dysuria, hematuria and urgency.  Musculoskeletal: Negative for joint pain and myalgias.  Skin: Negative for itching and rash.  Neurological: Negative for dizziness, tingling, weakness and headaches.  Endo/Heme/Allergies: Negative for environmental allergies. Does not bruise/bleed easily.  Psychiatric/Behavioral: Negative for depression. The patient is not nervous/anxious and does not have insomnia.   All other systems reviewed and are negative.    Objective:  Physical Exam Vitals signs reviewed.  Constitutional:      General:  She is not in acute distress.    Appearance: She is well-developed.  HENT:     Head: Normocephalic and atraumatic.     Mouth/Throat:     Pharynx: No oropharyngeal exudate.  Eyes:     Conjunctiva/sclera: Conjunctivae normal.     Pupils: Pupils are equal, round, and reactive to light.  Neck:     Vascular: No JVD.     Trachea: No tracheal deviation.     Comments: Loss of supraclavicular fat Cardiovascular:     Rate and Rhythm: Normal rate and regular rhythm.     Heart sounds: S1 normal and S2 normal.     Comments: Distant heart tones Pulmonary:     Effort: No tachypnea or accessory muscle usage.     Breath sounds: No stridor. Decreased breath sounds (throughout all lung fields) present. No wheezing, rhonchi or rales.     Comments: Crackles in bases  Abdominal:     General: Bowel sounds are normal. There is no distension.     Palpations: Abdomen is soft.     Tenderness: There is no abdominal tenderness.  Musculoskeletal:        General: Deformity (muscle wasting ) present.     Comments: Joint hypertrophy of the hand, history of RA  Skin:    General: Skin is warm and dry.     Capillary Refill: Capillary refill takes less than 2 seconds.     Findings: No rash.  Neurological:     Mental Status: She is alert and oriented to person, place, and time.  Psychiatric:        Behavior: Behavior normal.      Vitals:   11/12/19 1124  BP: 128/80  Pulse: (!) 104  Temp: 98.4 F (36.9 C)  TempSrc: Temporal  SpO2: 96%  Weight: 132 lb (59.9 kg)  Height: 5' 3"  (1.6 m)   96% on RA BMI Readings from Last 3 Encounters:  11/12/19 23.38 kg/m  10/22/19 31.23 kg/m  10/14/19 24.62 kg/m   Wt Readings from Last 3 Encounters:  11/12/19 132 lb (59.9 kg)  10/22/19 176 lb 4.8 oz (80 kg)  10/14/19 134 lb 9.6 oz (61.1 kg)     CBC    Component Value Date/Time   WBC 9.1 10/22/2019 1200   RBC 2.71 (L) 10/22/2019 1200   HGB 9.6 (L) 10/22/2019 1200   HGB 10.9 (L) 01/01/2018 1359   HGB  10.8 (L) 01/03/2017 1246   HCT 29.4 (L) 10/22/2019 1200   HCT 32.8 (L) 01/03/2017 1246   PLT 113 (L) 10/22/2019 1200   PLT 131 (L) 01/01/2018 1359   PLT 163 01/03/2017 1246   MCV 108.5 (H) 10/22/2019 1200   MCV 102.8 (H) 01/03/2017 1246   MCH 35.4 (H) 10/22/2019 1200   MCHC 32.7 10/22/2019 1200   RDW 15.3 10/22/2019 1200   RDW 14.3 01/03/2017 1246   LYMPHSABS 2.3 01/01/2018 1359   LYMPHSABS 2.9 01/03/2017 1246   MONOABS 0.6 01/01/2018 1359   MONOABS 1.1 (H) 01/03/2017 1246   EOSABS 0.0 01/01/2018 1359   EOSABS 0.1 01/03/2017 1246   BASOSABS 0.1 01/01/2018 1359   BASOSABS 0.0 01/03/2017 1246    Chest Imaging: 07/07/2019: CT lung cancer screening: Lung RADS 4 a, right lower lobe patchy opacities.  Significant emphysema.  10/08/2019 low-dose lung cancer screening CT: Lung RADS 2, inferior lingular nodule resolved.  Now quit greater than 15 years no additional need for low-dose lung cancer screening.  Septal thickening in the bases concerning for potential ILD.  Pulmonary Functions Testing Results: PFT Results Latest Ref Rng & Units 01/26/2016  FVC-Pre L 2.31  FVC-Predicted Pre % 79  FVC-Post L 2.45  FVC-Predicted Post % 84  Pre FEV1/FVC % % 69  Post FEV1/FCV % % 69  FEV1-Pre L 1.60  FEV1-Predicted Pre % 72  FEV1-Post L 1.70  DLCO UNC% % 56  DLCO COR %Predicted % 70  TLC L 4.25  TLC % Predicted % 86  RV % Predicted % 84    FeNO: None   Pathology: None  Echocardiogram: None   Heart Catheterization: None     Assessment & Plan:     ICD-10-CM   1. Stage 2 moderate COPD by GOLD classification (HCC)  J44.9   2. Shortness of breath  R06.02 HRCT ILD - CT CHEST HIGH RESOLUTION  3. ILD (interstitial lung disease) ([place])  J84.9   4. Rheumatoid arthritis involving both hands with positive rheumatoid factor (HCC)  M05.741    M05.742   5. Infliximab (Remicade) long-term use  Z79.899     Discussion:  This is a 73 year old female with a abnormal lung cancer screening CT  that was initially evaluated with follow-up.  The inflammatory lower lobe changes have disappeared.  She does have some interlobular septal thickening.  With her history of rheumatoid arthritis she could very well have a underlying development of ILD.  Plan: High-resolution CT scan of the chest ILD protocol in 3 months Also have pulmonary function test scheduled prior to the next office visit. Continue current inhaler regimen Infliximab plus prednisone, RA management per rheumatology. Continue Stiolto  Return to clinic in 3 months  Greater than 50% of this patient's 25-minute of visit was been face-to-face discussing above recommendations and treatment plan.    Current Outpatient Medications:  .  acetaminophen (TYLENOL) 500 MG tablet,  Take 1,000 mg by mouth daily as needed for mild pain. , Disp: , Rfl:  .  B Complex-C (B-COMPLEX WITH VITAMIN C) tablet, Take 1 tablet by mouth daily., Disp: , Rfl:  .  Calcium Carb-Cholecalciferol (CALCIUM-VITAMIN D) 600-400 MG-UNIT TABS, Take 2 tablets by mouth daily., Disp: , Rfl:  .  cholecalciferol (VITAMIN D) 1000 UNITS tablet, Take 1,000 Units by mouth daily., Disp: , Rfl:  .  denosumab (PROLIA) 60 MG/ML SOSY injection, Inject 60 mg into the skin every 6 (six) months., Disp: , Rfl:  .  gabapentin (NEURONTIN) 300 MG capsule, Take 300 mg by mouth 3 (three) times daily. , Disp: , Rfl:  .  InFLIXimab (REMICADE IV), Inject 10 mg into the vein as directed. Every 2 months, Disp: , Rfl:  .  loratadine (CLARITIN) 10 MG tablet, Take 5 mg by mouth daily., Disp: , Rfl:  .  losartan (COZAAR) 50 MG tablet, Take 50 mg by mouth daily. , Disp: , Rfl:  .  metoprolol succinate (TOPROL-XL) 50 MG 24 hr tablet, Take 50 mg by mouth every morning. , Disp: , Rfl:  .  predniSONE (DELTASONE) 5 MG tablet, Take 5 mg by mouth every other day. , Disp: , Rfl:  .  pseudoephedrine-acetaminophen (TYLENOL SINUS) 30-500 MG TABS tablet, Take 1 tablet by mouth daily as needed (sinus  headaches)., Disp: , Rfl:  .  RUTIN-50 PO, Take 50 mg by mouth daily., Disp: , Rfl:  .  Tiotropium Bromide-Olodaterol (STIOLTO RESPIMAT) 2.5-2.5 MCG/ACT AERS, Inhale 2 puffs into the lungs daily., Disp: , Rfl:  .  vitamin C (ASCORBIC ACID) 500 MG tablet, Take 500 mg by mouth daily., Disp: , Rfl:  .  Wheat Dextrin (BENEFIBER) POWD, Take 1 Dose by mouth daily., Disp: , Rfl:  .  Turmeric 500 MG CAPS, Take 500 mg by mouth daily. , Disp: , Rfl:    Garner Nash, DO [facility] 11/12/2019 11:29 AM

## 2019-11-12 NOTE — Patient Instructions (Signed)
Thank you for visiting Dr. Valeta Harms at [facility]. Today we recommend the following:  Orders Placed This Encounter  Procedures  . HRCT ILD - CT CHEST HIGH RESOLUTION   Please call with any refill needs.   Return in about 3 months (around 02/12/2020) for with APP or Dr. Valeta Harms.    Please do your part to reduce the spread of COVID-19.

## 2019-11-12 NOTE — Addendum Note (Signed)
Addended by: Lia Foyer R on: 11/12/2019 12:09 PM   Modules accepted: Orders

## 2019-11-19 DIAGNOSIS — S81812A Laceration without foreign body, left lower leg, initial encounter: Secondary | ICD-10-CM | POA: Diagnosis not present

## 2019-11-19 DIAGNOSIS — B999 Unspecified infectious disease: Secondary | ICD-10-CM | POA: Diagnosis not present

## 2019-11-24 DIAGNOSIS — S81812A Laceration without foreign body, left lower leg, initial encounter: Secondary | ICD-10-CM | POA: Diagnosis not present

## 2019-11-27 DIAGNOSIS — W19XXXD Unspecified fall, subsequent encounter: Secondary | ICD-10-CM | POA: Diagnosis not present

## 2019-11-27 DIAGNOSIS — S8012XD Contusion of left lower leg, subsequent encounter: Secondary | ICD-10-CM | POA: Diagnosis not present

## 2019-12-22 ENCOUNTER — Telehealth: Payer: Self-pay | Admitting: Hematology

## 2019-12-22 NOTE — Telephone Encounter (Signed)
Scheduled appt per 12/28 sch message - pt is aware of appt date and time   

## 2020-01-06 DIAGNOSIS — M0579 Rheumatoid arthritis with rheumatoid factor of multiple sites without organ or systems involvement: Secondary | ICD-10-CM | POA: Diagnosis not present

## 2020-01-07 NOTE — Progress Notes (Signed)
[facility]   [phone] Fax:([phone]   Clinic Follow up Note   Patient Care Team: [name], MD as PCP - General (Family Medicine) Gavin Pound, MD as Consulting Physician (Rheumatology)  Date of Service:  01/09/2020  CHIEF COMPLAINT: F/u of Anemia and thrombocytopenia    CURRENT THERAPY:  Observation  INTERVAL HISTORY:  [name] is here for a follow up. She was last seen by me 2 years ago. She presents to the clinic alone. She was notes her rheumatologist recommended she follow up with me given her lower blood counts recently. She notes flaring pain in her left hand. She is currently on Remicade and Prolia for treatment. She notes she take vitamin supplements. She denies any obvious bleeding lately.      REVIEW OF SYSTEMS:   Constitutional: Denies fevers, chills or abnormal weight loss Eyes: Denies blurriness of vision Ears, nose, mouth, throat, and face: Denies mucositis or sore throat Respiratory: Denies cough, dyspnea or wheezes Cardiovascular: Denies palpitation, chest discomfort or lower extremity swelling Gastrointestinal:  Denies nausea, heartburn or change in bowel habits Skin: Denies abnormal skin rashes MSK: (+) Left hand pain intermittent Lymphatics: Denies new lymphadenopathy or easy bruising Neurological:Denies numbness, tingling or new weaknesses Behavioral/Psych: Mood is stable, no new changes  All other systems were reviewed with the patient and are negative.  MEDICAL HISTORY:  Past Medical History:  Diagnosis Date  . Arthritis    Rheumatoid arthritis  . Celiac disease   . Chronic kidney disease    stage 3 from MD notes  . COPD (chronic obstructive pulmonary disease) ([place])   . Dyspnea    with going up stairs  . Family history of adverse reaction to anesthesia    father had hard time waking up  . Headache    sinus headaches  . Hot flashes   . Hypertension   . Iron deficiency anemia   . Pneumonia      per patient "I have walking pneumonia"    SURGICAL HISTORY: Past Surgical History:  Procedure Laterality Date  . COLONOSCOPY    . ECTOPIC PREGNANCY SURGERY      x 2  . REVERSE SHOULDER ARTHROPLASTY Right 02/02/2017   Procedure: RIGHT REVERSE SHOULDER ARTHROPLASTY;  Surgeon: Netta Cedars, MD;  Location: [facility];  Service: Orthopedics;  Laterality: Right;    I have reviewed the social history and family history with the patient and they are unchanged from previous note.  ALLERGIES:  is allergic to gluten meal; penicillins; alendronate; and hydroxychloroquine.  MEDICATIONS:  Current Outpatient Medications  Medication Sig Dispense Refill  . acetaminophen (TYLENOL) 500 MG tablet Take 1,000 mg by mouth daily as needed for mild pain.     . B Complex-C (B-COMPLEX WITH VITAMIN C) tablet Take 1 tablet by mouth daily.    . Calcium Carb-Cholecalciferol (CALCIUM-VITAMIN D) 600-400 MG-UNIT TABS Take 2 tablets by mouth daily.    . cholecalciferol (VITAMIN D) 1000 UNITS tablet Take 1,000 Units by mouth daily.    Marland Kitchen denosumab (PROLIA) 60 MG/ML SOSY injection Inject 60 mg into the skin every 6 (six) months.    . gabapentin (NEURONTIN) 300 MG capsule Take 300 mg by mouth 3 (three) times daily.     . InFLIXimab (REMICADE IV) Inject 10 mg into the vein as directed. Every 2 months    . loratadine (CLARITIN) 10 MG tablet Take 5 mg by mouth daily.    Marland Kitchen losartan (COZAAR) 50 MG tablet Take 50 mg  by mouth daily.     . metoprolol succinate (TOPROL-XL) 50 MG 24 hr tablet Take 50 mg by mouth every morning.     . predniSONE (DELTASONE) 5 MG tablet Take 5 mg by mouth every other day.     . pseudoephedrine-acetaminophen (TYLENOL SINUS) 30-500 MG TABS tablet Take 1 tablet by mouth daily as needed (sinus headaches).    . RUTIN-50 PO Take 50 mg by mouth daily.    . Tiotropium Bromide-Olodaterol (STIOLTO RESPIMAT) 2.5-2.5 MCG/ACT AERS Inhale 2 puffs into the lungs daily.    . vitamin C (ASCORBIC ACID) 500 MG tablet Take  500 mg by mouth daily.    . Wheat Dextrin (BENEFIBER) POWD Take 1 Dose by mouth daily.     No current facility-administered medications for this visit.    PHYSICAL EXAMINATION: ECOG PERFORMANCE STATUS: 1 - Symptomatic but completely ambulatory  Vitals:   01/09/20 1400  BP: (!) 173/76  Pulse: 78  Resp: 16  Temp: 98.2 F (36.8 C)  SpO2: 100%   Filed Weights   01/09/20 1400  Weight: 132 lb 12.8 oz (60.2 kg)    GENERAL:alert, no distress and comfortable SKIN: skin color, texture, turgor are normal, no rashes or significant lesions (+) Bruise of lower right anterior leg and forearms.  EYES: normal, Conjunctiva are pink and non-injected, sclera clear  NECK: supple, thyroid normal size, non-tender, without nodularity LYMPH:  no palpable lymphadenopathy in the cervical, axillary  LUNGS: clear to auscultation and percussion with normal breathing effort HEART: regular rate & rhythm and no murmurs and no lower extremity edema ABDOMEN:abdomen soft, non-tender and normal bowel sounds Musculoskeletal:no cyanosis of digits and no clubbing  NEURO: alert & oriented x 3 with fluent speech, no focal motor/sensory deficits  LABORATORY DATA:  I have reviewed the data as listed CBC Latest Ref Rng & Units 01/09/2020 10/22/2019 01/01/2018  WBC 4.0 - 10.5 K/uL 10.3 9.1 8.1  Hemoglobin 12.0 - 15.0 g/dL 10.0(L) 9.6(L) 10.9(L)  Hematocrit 36.0 - 46.0 % 30.5(L) 29.4(L) 33.1(L)  Platelets 150 - 400 K/uL 119(L) 113(L) 131(L)     CMP Latest Ref Rng & Units 01/09/2020 10/22/2019 02/03/2017  Glucose 70 - 99 mg/dL 87 104(H) 109(H)  BUN 8 - 23 mg/dL 16 16 14   Creatinine 0.44 - 1.00 mg/dL 1.12(H) 1.42(H) 1.05(H)  Sodium 135 - 145 mmol/L 137 131(L) 135  Potassium 3.5 - 5.1 mmol/L 4.0 4.4 3.7  Chloride 98 - 111 mmol/L 102 97(L) 99(L)  CO2 22 - 32 mmol/L 24 22 26   Calcium 8.9 - 10.3 mg/dL 9.4 9.8 8.6(L)  Total Protein 6.5 - 8.1 g/dL 7.1 6.7 -  Total Bilirubin 0.3 - 1.2 mg/dL 0.5 0.9 -  Alkaline Phos 38 -  126 U/L 49 36(L) -  AST 15 - 41 U/L 25 34 -  ALT 0 - 44 U/L 17 20 -      RADIOGRAPHIC STUDIES: I have personally reviewed the radiological images as listed and agreed with the findings in the report. No results found.   ASSESSMENT & PLAN:  ESPYN RADWAN is a 74 y.o. female with     1. 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 SPEP and UPEP with immunofixation was negative. No lab evidence of hemolysis, erythropoietin level is normal. Sedimentation rate was normal, no evidence of chronic inflammation. But she was diagnosed with rheumatoid  arthritis, anemia of chronic disease is still most likely. 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 No need for treatment given her mild thrombocytopenia. -Her bone marrow biopsy showed several small lymphoid aggregation, suspicious for low-grade B cell lymphoproliferative process, especially SLL. Her peripheral white count is normal, no elevated lymphocytes. I previously reviewed her CT scan result from 03/2015, which was negative for adenopathy or splenomegaly.  -I do not think she has clinical evidence of small lymphocytic lymphoma at this point. I'll follow her clinically, and consider repeat a bone marrow biopsy only if her blood counts get worse. -Since her last visit her blood counts have been fluctuating. Her Hg improved from 9.6 2 months ago to 10 today. plt mostly stable at 119K. Overall her counts are stable. She is asymptomatic with only very mild bruising, there is no indication for treatment at this time.  -I reviewed her low blood counts are related to her RA. She is currently on treatment for this. I encouraged her to continue to monitor her labs twice a year and f/u  with her other physicians.  -F/u in 1 year.      2. Rheumatoid arthritis -She will continue follow-up with her primary care physician Dr Coral Spikes and rheumatologist Dr Lenna Gilford. -She underwent right shoulder surgery in 01/2017, arthritis in that location much improved. -Her current flare of pain is in her left hand.  -She is currently on Remicade q65month. She also takes vitamin supplements. She gets prolia injection q631monthwith her PCP.    Plan -Labs reviewed, no treatment indicated at this time.  -Copy note to Dr. HaTrudie Reed-Lab and F/u in 1 year, she will repeat CBC with her PCP and Dr. HaLenna Gilfordn the interim    No problem-specific [place] found for this encounter.   No orders of the defined types were placed in this encounter.  All questions were answered. The patient knows to call the clinic with any problems, questions or concerns. No barriers to learning was detected. The total time spent in the appointment was 20 minutes.     YaTruitt MerleMD 01/09/2020   I, AmJoslyn Devonam acting as scribe for YaTruitt MerleMD.   I have reviewed the above documentation for accuracy and completeness, and I agree with the above.

## 2020-01-09 ENCOUNTER — Other Ambulatory Visit: Payer: Self-pay

## 2020-01-09 ENCOUNTER — Inpatient Hospital Stay: Payer: Medicare Other | Attending: Hematology | Admitting: Hematology

## 2020-01-09 ENCOUNTER — Inpatient Hospital Stay: Payer: Medicare Other

## 2020-01-09 ENCOUNTER — Encounter: Payer: Self-pay | Admitting: Hematology

## 2020-01-09 ENCOUNTER — Telehealth: Payer: Self-pay | Admitting: Hematology

## 2020-01-09 VITALS — BP 173/76 | HR 78 | Temp 98.2°F | Resp 16 | Ht 63.0 in | Wt 132.8 lb

## 2020-01-09 DIAGNOSIS — D638 Anemia in other chronic diseases classified elsewhere: Secondary | ICD-10-CM

## 2020-01-09 DIAGNOSIS — D696 Thrombocytopenia, unspecified: Secondary | ICD-10-CM | POA: Diagnosis not present

## 2020-01-09 DIAGNOSIS — D649 Anemia, unspecified: Secondary | ICD-10-CM | POA: Insufficient documentation

## 2020-01-09 DIAGNOSIS — M069 Rheumatoid arthritis, unspecified: Secondary | ICD-10-CM | POA: Insufficient documentation

## 2020-01-09 LAB — CBC WITH DIFFERENTIAL (CANCER CENTER ONLY)
Abs Immature Granulocytes: 0.04 10*3/uL (ref 0.00–0.07)
Basophils Absolute: 0 10*3/uL (ref 0.0–0.1)
Basophils Relative: 0 %
Eosinophils Absolute: 0.4 10*3/uL (ref 0.0–0.5)
Eosinophils Relative: 4 %
HCT: 30.5 % — ABNORMAL LOW (ref 36.0–46.0)
Hemoglobin: 10 g/dL — ABNORMAL LOW (ref 12.0–15.0)
Immature Granulocytes: 0 %
Lymphocytes Relative: 49 %
Lymphs Abs: 4.9 10*3/uL — ABNORMAL HIGH (ref 0.7–4.0)
MCH: 35.5 pg — ABNORMAL HIGH (ref 26.0–34.0)
MCHC: 32.8 g/dL (ref 30.0–36.0)
MCV: 108.2 fL — ABNORMAL HIGH (ref 80.0–100.0)
Monocytes Absolute: 1.6 10*3/uL — ABNORMAL HIGH (ref 0.1–1.0)
Monocytes Relative: 15 %
Neutro Abs: 3.3 10*3/uL (ref 1.7–7.7)
Neutrophils Relative %: 32 %
Platelet Count: 119 10*3/uL — ABNORMAL LOW (ref 150–400)
RBC: 2.82 MIL/uL — ABNORMAL LOW (ref 3.87–5.11)
RDW: 15.7 % — ABNORMAL HIGH (ref 11.5–15.5)
WBC Count: 10.3 10*3/uL (ref 4.0–10.5)
nRBC: 0 % (ref 0.0–0.2)

## 2020-01-09 LAB — CMP (CANCER CENTER ONLY)
ALT: 17 U/L (ref 0–44)
AST: 25 U/L (ref 15–41)
Albumin: 4.3 g/dL (ref 3.5–5.0)
Alkaline Phosphatase: 49 U/L (ref 38–126)
Anion gap: 11 (ref 5–15)
BUN: 16 mg/dL (ref 8–23)
CO2: 24 mmol/L (ref 22–32)
Calcium: 9.4 mg/dL (ref 8.9–10.3)
Chloride: 102 mmol/L (ref 98–111)
Creatinine: 1.12 mg/dL — ABNORMAL HIGH (ref 0.44–1.00)
GFR, Est AFR Am: 56 mL/min — ABNORMAL LOW (ref >60)
GFR, Estimated: 49 mL/min — ABNORMAL LOW (ref >60)
Glucose, Bld: 87 mg/dL (ref 70–99)
Potassium: 4 mmol/L (ref 3.5–5.1)
Sodium: 137 mmol/L (ref 135–145)
Total Bilirubin: 0.5 mg/dL (ref 0.3–1.2)
Total Protein: 7.1 g/dL (ref 6.5–8.1)

## 2020-01-09 LAB — RETICULOCYTES
Immature Retic Fract: 19.5 % — ABNORMAL HIGH (ref 2.3–15.9)
RBC.: 2.73 MIL/uL — ABNORMAL LOW (ref 3.87–5.11)
Retic Count, Absolute: 72.6 10*3/uL (ref 19.0–186.0)
Retic Ct Pct: 2.7 % (ref 0.4–3.1)

## 2020-01-09 NOTE — Telephone Encounter (Signed)
Scheduled appt per 1/15 los.  Sent a message to HIM pool to get a calendar mailed out.

## 2020-02-08 ENCOUNTER — Ambulatory Visit: Payer: Medicare Other

## 2020-02-10 DIAGNOSIS — D225 Melanocytic nevi of trunk: Secondary | ICD-10-CM | POA: Diagnosis not present

## 2020-02-10 DIAGNOSIS — D1801 Hemangioma of skin and subcutaneous tissue: Secondary | ICD-10-CM | POA: Diagnosis not present

## 2020-02-10 DIAGNOSIS — L821 Other seborrheic keratosis: Secondary | ICD-10-CM | POA: Diagnosis not present

## 2020-02-12 ENCOUNTER — Other Ambulatory Visit: Payer: Medicare Other

## 2020-02-18 ENCOUNTER — Ambulatory Visit: Payer: Medicare Other | Admitting: Pulmonary Disease

## 2020-02-18 NOTE — Progress Notes (Deleted)
Synopsis: Referred in August 2020 for shortness of breath by [name], MD  Subjective:   PATIENT ID: [name] GENDER: female DOB: August 05, 1946, MRN: [id number]  No chief complaint on file.   This is a 74 year old female past medical history of rheumatoid arthritis + remicade q8weeks, celiac disease, CKD stage III, former smoker quit 15 years ago, 30+-pack-year history of smoking, COPD Moderate GOLD 2, 77% predicted, 1.7L.  Lung RADS 4 a on most recent low-dose lung cancer screening.  Patient was scheduled to follow-up with me regarding abnormal lung nodule.  Follows with [facility].  OV 08/06/2019: she has been smoking for many years, quit 16 years ago. She stopped 2 times in her life. She quit the first time in her 73s. She was enrolled LDCT and doing well.  She is concerned because she watched her sister have continued decline from COPD.  She also saw her mother died from COPD.  She is not been on any maintenance medications before.  She is only been using as needed short acting Combivent.  She does have exertional dyspnea.  She does try to exercise regularly.  She feels some shortness of breath with exercise but otherwise doing well.  OV 11/12/2019: Low-dose lung cancer screening CT completed in October.  Lung nodule followed in the lingula has resolved. Overall, she is doing well today. Breathlessness is stable.  Overall doing well today.  She does feels her breathing is stable.  Today in the office we reviewed her CT imaging.  Her joints in her hands tend to bother her towards the last few weeks before its time to get her next Remicade injection.  She is currently taking prednisone 5 mg every other day.  She has been working with her rheumatologist to try to come off of this if at all possible.  Currently doing well with her inhaler regimen.  And glad that she was able to switch.  Husband is currently out of town on a fishing/hunting trip in the [place].  OV 02/18/2020:     Past Medical History:  Diagnosis Date  . Arthritis    Rheumatoid arthritis  . Celiac disease   . Chronic kidney disease    stage 3 from MD notes  . COPD (chronic obstructive pulmonary disease) ([facility])   . Dyspnea    with going up stairs  . Family history of adverse reaction to anesthesia    father had hard time waking up  . Headache    sinus headaches  . Hot flashes   . Hypertension   . Iron deficiency anemia   . Pneumonia    per patient "I have walking pneumonia"     Family History  Problem Relation Age of Onset  . Peripheral Artery Disease Father   . Diabetes Father      Past Surgical History:  Procedure Laterality Date  . COLONOSCOPY    . ECTOPIC PREGNANCY SURGERY      x 2  . REVERSE SHOULDER ARTHROPLASTY Right 02/02/2017   Procedure: RIGHT REVERSE SHOULDER ARTHROPLASTY;  Surgeon: Netta Cedars, MD;  Location: [facility];  Service: Orthopedics;  Laterality: Right;    Social History   Socioeconomic History  . Marital status: Married    Spouse name: Not on file  . Number of children: Not on file  . Years of education: Not on file  . Highest education level: Not on file  Occupational History  . Not on file  Tobacco Use  . Smoking status: Former  Smoker    Packs/day: 1.00    Years: 30.00    Pack years: 30.00    Types: Cigarettes    Quit date: 2005    Years since quitting: 16.1  . Smokeless tobacco: Never Used  Substance and Sexual Activity  . Alcohol use: Yes    Alcohol/week: 12.0 - 14.0 standard drinks    Types: 12 - 14 Glasses of wine per week  . Drug use: No  . Sexual activity: Not on file  Other Topics Concern  . Not on file  Social History Narrative  . Not on file   Social Determinants of Health   Financial Resource Strain:   . Difficulty of Paying Living Expenses: Not on file  Food Insecurity:   . Worried About Charity fundraiser in the Last Year: Not on file  . Ran Out of Food in the Last Year: Not on file  Transportation Needs:   . Lack of  Transportation (Medical): Not on file  . Lack of Transportation (Non-Medical): Not on file  Physical Activity:   . Days of Exercise per Week: Not on file  . Minutes of Exercise per Session: Not on file  Stress:   . Feeling of Stress : Not on file  Social Connections:   . Frequency of Communication with Friends and Family: Not on file  . Frequency of Social Gatherings with Friends and Family: Not on file  . Attends Religious Services: Not on file  . Active Member of Clubs or Organizations: Not on file  . Attends Archivist Meetings: Not on file  . Marital Status: Not on file  Intimate Partner Violence:   . Fear of Current or Ex-Partner: Not on file  . Emotionally Abused: Not on file  . Physically Abused: Not on file  . Sexually Abused: Not on file     Allergies  Allergen Reactions  . Gluten Meal Diarrhea  . Penicillins Rash    Has patient had a PCN reaction causing immediate rash, facial/tongue/throat swelling, SOB or lightheadedness with hypotension: Yes Has patient had a PCN reaction causing severe rash involving mucus membranes or skin necrosis: No Has patient had a PCN reaction that required hospitalization No Has patient had a PCN reaction occurring within the last 10 years: No If all of the above answers are "NO", then may proceed with Cephalosporin use.   . Alendronate Rash  . Hydroxychloroquine Itching     Outpatient Medications Prior to Visit  Medication Sig Dispense Refill  . acetaminophen (TYLENOL) 500 MG tablet Take 1,000 mg by mouth daily as needed for mild pain.     . B Complex-C (B-COMPLEX WITH VITAMIN C) tablet Take 1 tablet by mouth daily.    . Calcium Carb-Cholecalciferol (CALCIUM-VITAMIN D) 600-400 MG-UNIT TABS Take 2 tablets by mouth daily.    . cholecalciferol (VITAMIN D) 1000 UNITS tablet Take 1,000 Units by mouth daily.    Marland Kitchen denosumab (PROLIA) 60 MG/ML SOSY injection Inject 60 mg into the skin every 6 (six) months.    . gabapentin (NEURONTIN)  300 MG capsule Take 300 mg by mouth 3 (three) times daily.     . InFLIXimab (REMICADE IV) Inject 10 mg into the vein as directed. Every 2 months    . loratadine (CLARITIN) 10 MG tablet Take 5 mg by mouth daily.    Marland Kitchen losartan (COZAAR) 50 MG tablet Take 50 mg by mouth daily.     . metoprolol succinate (TOPROL-XL) 50 MG 24 hr tablet Take  50 mg by mouth every morning.     . predniSONE (DELTASONE) 5 MG tablet Take 5 mg by mouth every other day.     . pseudoephedrine-acetaminophen (TYLENOL SINUS) 30-500 MG TABS tablet Take 1 tablet by mouth daily as needed (sinus headaches).    . RUTIN-50 PO Take 50 mg by mouth daily.    . Tiotropium Bromide-Olodaterol (STIOLTO RESPIMAT) 2.5-2.5 MCG/ACT AERS Inhale 2 puffs into the lungs daily.    . vitamin C (ASCORBIC ACID) 500 MG tablet Take 500 mg by mouth daily.    . Wheat Dextrin (BENEFIBER) POWD Take 1 Dose by mouth daily.     No facility-administered medications prior to visit.    ROS   Objective:  Physical Exam   There were no vitals filed for this visit.   on RA BMI Readings from Last 3 Encounters:  01/09/20 23.52 kg/m  11/12/19 23.38 kg/m  10/22/19 31.23 kg/m   Wt Readings from Last 3 Encounters:  01/09/20 132 lb 12.8 oz (60.2 kg)  11/12/19 132 lb (59.9 kg)  10/22/19 176 lb 4.8 oz (80 kg)     CBC    Component Value Date/Time   WBC 10.3 01/09/2020 1339   WBC 9.1 10/22/2019 1200   RBC 2.73 (L) 01/09/2020 1340   RBC 2.82 (L) 01/09/2020 1339   HGB 10.0 (L) 01/09/2020 1339   HGB 10.8 (L) 01/03/2017 1246   HCT 30.5 (L) 01/09/2020 1339   HCT 32.8 (L) 01/03/2017 1246   PLT 119 (L) 01/09/2020 1339   PLT 163 01/03/2017 1246   MCV 108.2 (H) 01/09/2020 1339   MCV 102.8 (H) 01/03/2017 1246   MCH 35.5 (H) 01/09/2020 1339   MCHC 32.8 01/09/2020 1339   RDW 15.7 (H) 01/09/2020 1339   RDW 14.3 01/03/2017 1246   LYMPHSABS 4.9 (H) 01/09/2020 1339   LYMPHSABS 2.9 01/03/2017 1246   MONOABS 1.6 (H) 01/09/2020 1339   MONOABS 1.1 (H)  01/03/2017 1246   EOSABS 0.4 01/09/2020 1339   EOSABS 0.1 01/03/2017 1246   BASOSABS 0.0 01/09/2020 1339   BASOSABS 0.0 01/03/2017 1246    Chest Imaging: 07/07/2019: CT lung cancer screening: Lung RADS 4 a, right lower lobe patchy opacities.  Significant emphysema.  10/08/2019 low-dose lung cancer screening CT: Lung RADS 2, inferior lingular nodule resolved.  Now quit greater than 15 years no additional need for low-dose lung cancer screening.  Septal thickening in the bases concerning for potential ILD.  Pulmonary Functions Testing Results: PFT Results Latest Ref Rng & Units 01/26/2016  FVC-Pre L 2.31  FVC-Predicted Pre % 79  FVC-Post L 2.45  FVC-Predicted Post % 84  Pre FEV1/FVC % % 69  Post FEV1/FCV % % 69  FEV1-Pre L 1.60  FEV1-Predicted Pre % 72  FEV1-Post L 1.70  DLCO UNC% % 56  DLCO COR %Predicted % 70  TLC L 4.25  TLC % Predicted % 86  RV % Predicted % 84    FeNO: None   Pathology: None  Echocardiogram: None   Heart Catheterization: None     Assessment & Plan:   No diagnosis found.  Discussion:  This is a 74 year old female with a abnormal lung cancer screening CT that was initially evaluated with follow-up.  The inflammatory lower lobe changes have disappeared.  She does have some interlobular septal thickening.  With her history of rheumatoid arthritis she could very well have a underlying development of ILD.  Plan: High-resolution CT scan of the chest ILD protocol in 3 months Also  have pulmonary function test scheduled prior to the next office visit. Continue current inhaler regimen Infliximab plus prednisone, RA management per rheumatology. Continue Stiolto  Return to clinic in 3 months  Greater than 50% of this patient's 25-minute of visit was been face-to-face discussing above recommendations and treatment plan.    Current Outpatient Medications:  .  acetaminophen (TYLENOL) 500 MG tablet, Take 1,000 mg by mouth daily as needed for mild pain. ,  Disp: , Rfl:  .  B Complex-C (B-COMPLEX WITH VITAMIN C) tablet, Take 1 tablet by mouth daily., Disp: , Rfl:  .  Calcium Carb-Cholecalciferol (CALCIUM-VITAMIN D) 600-400 MG-UNIT TABS, Take 2 tablets by mouth daily., Disp: , Rfl:  .  cholecalciferol (VITAMIN D) 1000 UNITS tablet, Take 1,000 Units by mouth daily., Disp: , Rfl:  .  denosumab (PROLIA) 60 MG/ML SOSY injection, Inject 60 mg into the skin every 6 (six) months., Disp: , Rfl:  .  gabapentin (NEURONTIN) 300 MG capsule, Take 300 mg by mouth 3 (three) times daily. , Disp: , Rfl:  .  InFLIXimab (REMICADE IV), Inject 10 mg into the vein as directed. Every 2 months, Disp: , Rfl:  .  loratadine (CLARITIN) 10 MG tablet, Take 5 mg by mouth daily., Disp: , Rfl:  .  losartan (COZAAR) 50 MG tablet, Take 50 mg by mouth daily. , Disp: , Rfl:  .  metoprolol succinate (TOPROL-XL) 50 MG 24 hr tablet, Take 50 mg by mouth every morning. , Disp: , Rfl:  .  predniSONE (DELTASONE) 5 MG tablet, Take 5 mg by mouth every other day. , Disp: , Rfl:  .  pseudoephedrine-acetaminophen (TYLENOL SINUS) 30-500 MG TABS tablet, Take 1 tablet by mouth daily as needed (sinus headaches)., Disp: , Rfl:  .  RUTIN-50 PO, Take 50 mg by mouth daily., Disp: , Rfl:  .  Tiotropium Bromide-Olodaterol (STIOLTO RESPIMAT) 2.5-2.5 MCG/ACT AERS, Inhale 2 puffs into the lungs daily., Disp: , Rfl:  .  vitamin C (ASCORBIC ACID) 500 MG tablet, Take 500 mg by mouth daily., Disp: , Rfl:  .  Wheat Dextrin (BENEFIBER) POWD, Take 1 Dose by mouth daily., Disp: , Rfl:    Garner Nash, DO [facility] 02/18/2020 8:47 AM

## 2020-02-20 ENCOUNTER — Ambulatory Visit
Admission: RE | Admit: 2020-02-20 | Discharge: 2020-02-20 | Disposition: A | Discharge status: Home / Self Care | Payer: Medicare Other | Source: Ambulatory Visit | Attending: Pulmonary Disease | Admitting: Pulmonary Disease

## 2020-02-20 DIAGNOSIS — J432 Centrilobular emphysema: Secondary | ICD-10-CM | POA: Diagnosis not present

## 2020-02-20 DIAGNOSIS — J849 Interstitial pulmonary disease, unspecified: Secondary | ICD-10-CM

## 2020-02-20 DIAGNOSIS — R0602 Shortness of breath: Secondary | ICD-10-CM

## 2020-02-25 ENCOUNTER — Encounter: Payer: Self-pay | Admitting: Pulmonary Disease

## 2020-02-25 ENCOUNTER — Other Ambulatory Visit: Payer: Self-pay

## 2020-02-25 ENCOUNTER — Ambulatory Visit (INDEPENDENT_AMBULATORY_CARE_PROVIDER_SITE_OTHER): Payer: Medicare Other | Admitting: Pulmonary Disease

## 2020-02-25 VITALS — BP 154/78 | HR 93 | Temp 98.8°F | Ht 63.0 in | Wt 134.0 lb

## 2020-02-25 DIAGNOSIS — M05742 Rheumatoid arthritis with rheumatoid factor of left hand without organ or systems involvement: Secondary | ICD-10-CM | POA: Diagnosis not present

## 2020-02-25 DIAGNOSIS — R918 Other nonspecific abnormal finding of lung field: Secondary | ICD-10-CM | POA: Diagnosis not present

## 2020-02-25 DIAGNOSIS — Z79899 Other long term (current) drug therapy: Secondary | ICD-10-CM | POA: Diagnosis not present

## 2020-02-25 DIAGNOSIS — J449 Chronic obstructive pulmonary disease, unspecified: Secondary | ICD-10-CM

## 2020-02-25 DIAGNOSIS — M05741 Rheumatoid arthritis with rheumatoid factor of right hand without organ or systems involvement: Secondary | ICD-10-CM | POA: Diagnosis not present

## 2020-02-25 DIAGNOSIS — Z79620 Long term (current) use of immunosuppressive biologic: Secondary | ICD-10-CM

## 2020-02-25 MED ORDER — STIOLTO RESPIMAT 2.5-2.5 MCG/ACT IN AERS: 2.0000 | INHALATION_SPRAY | Freq: Every day | RESPIRATORY_TRACT | 11 refills | Status: DC

## 2020-02-25 NOTE — Patient Instructions (Addendum)
Thank you for visiting Dr. Valeta Harms at [facility]. Today we recommend the following:  Orders Placed This Encounter  Procedures  . CT Chest Wo Contrast   Continue stiolto  Call if you need anything   Return in about 1 year (around 02/24/2021). w/ Dr. Valeta Harms in 1 year to review CT images     Please do your part to reduce the spread of COVID-19.

## 2020-02-25 NOTE — Progress Notes (Signed)
Synopsis: Referred in August 2020 for shortness of breath by [name], MD  Subjective:   PATIENT ID: [name] GENDER: female DOB: 07/12/1946, MRN: [id number]  Chief Complaint  Patient presents with  . Follow-up    Patient is here for follow up after CT. Patient feels good otherwise    This is a 74 year old female past medical history of rheumatoid arthritis + remicade q8weeks, celiac disease, CKD stage III, former smoker quit 15 years ago, 30+-pack-year history of smoking, COPD Moderate GOLD 2, 77% predicted, 1.7L.  Lung RADS 4 a on most recent low-dose lung cancer screening.  Patient was scheduled to follow-up with me regarding abnormal lung nodule.  Follows with [facility].  OV 08/06/2019: she has been smoking for many years, quit 16 years ago. She stopped 2 times in her life. She quit the first time in her 67s. She was enrolled LDCT and doing well.  She is concerned because she watched her sister have continued decline from COPD.  She also saw her mother died from COPD.  She is not been on any maintenance medications before.  She is only been using as needed short acting Combivent.  She does have exertional dyspnea.  She does try to exercise regularly.  She feels some shortness of breath with exercise but otherwise doing well.  OV 11/12/2019: Low-dose lung cancer screening CT completed in October.  Lung nodule followed in the lingula has resolved. Overall, she is doing well today. Breathlessness is stable.  Overall doing well today.  She does feels her breathing is stable.  Today in the office we reviewed her CT imaging.  Her joints in her hands tend to bother her towards the last few weeks before its time to get her next Remicade injection.  She is currently taking prednisone 5 mg every other day.  She has been working with her rheumatologist to try to come off of this if at all possible.  Currently doing well with her inhaler regimen.  And glad that she was able  to switch.  Husband is currently out of town on a fishing/hunting trip in the [place].  OV 02/25/2020: Patient here today for follow-up after recent HRCT imaging.  Multiple small pulmonary nodules stable evidence of centrilobular and paraseptal emphysema.  No evidence of ILD.  Did review images today in the office.  Patient currently managed with Stiolto.  Breathing well at this time.  She does complain of thoracic back pain shoulder pain arm pain hand pain all the which is chronic for her and related to her prior rheumatoid arthritis history.  She still is receiving her Remicade infusions.  She did receive both of her COVID-19 vaccinations.    Past Medical History:  Diagnosis Date  . Arthritis    Rheumatoid arthritis  . Celiac disease   . Chronic kidney disease    stage 3 from MD notes  . COPD (chronic obstructive pulmonary disease) ([place])   . Dyspnea    with going up stairs  . Family history of adverse reaction to anesthesia    father had hard time waking up  . Headache    sinus headaches  . Hot flashes   . Hypertension   . Iron deficiency anemia   . Pneumonia    per patient "I have walking pneumonia"     Family History  Problem Relation Age of Onset  . Peripheral Artery Disease Father   . Diabetes Father      Past Surgical History:  Procedure Laterality Date  . COLONOSCOPY    . ECTOPIC PREGNANCY SURGERY      x 2  . REVERSE SHOULDER ARTHROPLASTY Right 02/02/2017   Procedure: RIGHT REVERSE SHOULDER ARTHROPLASTY;  Surgeon: Netta Cedars, MD;  Location: [place];  Service: Orthopedics;  Laterality: Right;    Social History   Socioeconomic History  . Marital status: Married    Spouse name: Not on file  . Number of children: Not on file  . Years of education: Not on file  . Highest education level: Not on file  Occupational History  . Not on file  Tobacco Use  . Smoking status: Former Smoker    Packs/day: 1.00    Years: 30.00    Pack years: 30.00    Types: Cigarettes     Quit date: 2005    Years since quitting: 16.1  . Smokeless tobacco: Never Used  Substance and Sexual Activity  . Alcohol use: Yes    Alcohol/week: 12.0 - 14.0 standard drinks    Types: 12 - 14 Glasses of wine per week  . Drug use: No  . Sexual activity: Not on file  Other Topics Concern  . Not on file  Social History Narrative  . Not on file   Social Determinants of Health   Financial Resource Strain:   . Difficulty of Paying Living Expenses: Not on file  Food Insecurity:   . Worried About Charity fundraiser in the Last Year: Not on file  . Ran Out of Food in the Last Year: Not on file  Transportation Needs:   . Lack of Transportation (Medical): Not on file  . Lack of Transportation (Non-Medical): Not on file  Physical Activity:   . Days of Exercise per Week: Not on file  . Minutes of Exercise per Session: Not on file  Stress:   . Feeling of Stress : Not on file  Social Connections:   . Frequency of Communication with Friends and Family: Not on file  . Frequency of Social Gatherings with Friends and Family: Not on file  . Attends Religious Services: Not on file  . Active Member of Clubs or Organizations: Not on file  . Attends Archivist Meetings: Not on file  . Marital Status: Not on file  Intimate Partner Violence:   . Fear of Current or Ex-Partner: Not on file  . Emotionally Abused: Not on file  . Physically Abused: Not on file  . Sexually Abused: Not on file     Allergies  Allergen Reactions  . Gluten Meal Diarrhea  . Penicillins Rash    Has patient had a PCN reaction causing immediate rash, facial/tongue/throat swelling, SOB or lightheadedness with hypotension: Yes Has patient had a PCN reaction causing severe rash involving mucus membranes or skin necrosis: No Has patient had a PCN reaction that required hospitalization No Has patient had a PCN reaction occurring within the last 10 years: No If all of the above answers are "NO", then may proceed  with Cephalosporin use.   . Alendronate Rash  . Hydroxychloroquine Itching     Outpatient Medications Prior to Visit  Medication Sig Dispense Refill  . acetaminophen (TYLENOL) 500 MG tablet Take 1,000 mg by mouth daily as needed for mild pain.     . B Complex-C (B-COMPLEX WITH VITAMIN C) tablet Take 1 tablet by mouth daily.    . Calcium Carb-Cholecalciferol (CALCIUM-VITAMIN D) 600-400 MG-UNIT TABS Take 2 tablets by mouth daily.    . cholecalciferol (VITAMIN  D) 1000 UNITS tablet Take 1,000 Units by mouth daily.    Marland Kitchen denosumab (PROLIA) 60 MG/ML SOSY injection Inject 60 mg into the skin every 6 (six) months.    . gabapentin (NEURONTIN) 300 MG capsule Take 300 mg by mouth 3 (three) times daily.     . InFLIXimab (REMICADE IV) Inject 10 mg into the vein as directed. Every 2 months    . loratadine (CLARITIN) 10 MG tablet Take 5 mg by mouth daily.    Marland Kitchen losartan (COZAAR) 50 MG tablet Take 50 mg by mouth daily.     . metoprolol succinate (TOPROL-XL) 50 MG 24 hr tablet Take 50 mg by mouth every morning.     . predniSONE (DELTASONE) 5 MG tablet Take 5 mg by mouth every other day.     . pseudoephedrine-acetaminophen (TYLENOL SINUS) 30-500 MG TABS tablet Take 1 tablet by mouth daily as needed (sinus headaches).    . RUTIN-50 PO Take 50 mg by mouth daily.    . Tiotropium Bromide-Olodaterol (STIOLTO RESPIMAT) 2.5-2.5 MCG/ACT AERS Inhale 2 puffs into the lungs daily.    . vitamin C (ASCORBIC ACID) 500 MG tablet Take 500 mg by mouth daily.    . Wheat Dextrin (BENEFIBER) POWD Take 1 Dose by mouth daily.     No facility-administered medications prior to visit.    Review of Systems  Constitutional: Negative for chills, fever, malaise/fatigue and weight loss.  HENT: Negative for hearing loss, sore throat and tinnitus.   Eyes: Negative for blurred vision and double vision.  Respiratory: Positive for shortness of breath. Negative for cough, hemoptysis, sputum production, wheezing and stridor.     Cardiovascular: Negative for chest pain, palpitations, orthopnea, leg swelling and PND.  Gastrointestinal: Negative for abdominal pain, constipation, diarrhea, heartburn, nausea and vomiting.  Genitourinary: Negative for dysuria, hematuria and urgency.  Musculoskeletal: Positive for back pain, joint pain, myalgias and neck pain.  Skin: Negative for itching and rash.  Neurological: Negative for dizziness, tingling, weakness and headaches.  Endo/Heme/Allergies: Negative for environmental allergies. Does not bruise/bleed easily.  Psychiatric/Behavioral: Negative for depression. The patient is not nervous/anxious and does not have insomnia.   All other systems reviewed and are negative.    Objective:  Physical Exam Vitals reviewed.  Constitutional:      General: She is not in acute distress.    Appearance: She is well-developed.  HENT:     Head: Normocephalic and atraumatic.  Eyes:     Conjunctiva/sclera: Conjunctivae normal.     Pupils: Pupils are equal, round, and reactive to light.  Neck:     Vascular: No JVD.     Trachea: No tracheal deviation.     Comments: Loss of supraclavicular fat Cardiovascular:     Rate and Rhythm: Normal rate and regular rhythm.     Heart sounds: S1 normal and S2 normal.     Comments: Distant heart tones Pulmonary:     Effort: No tachypnea or accessory muscle usage.     Breath sounds: No stridor. Decreased breath sounds (throughout all lung fields) present. No rhonchi or rales.  Abdominal:     General: Bowel sounds are normal.     Palpations: Abdomen is soft.  Musculoskeletal:        General: Deformity (muscle wasting ) present.     Comments: Thoracic kyphosis    Skin:    General: Skin is warm and dry.     Capillary Refill: Capillary refill takes less than 2 seconds.  Findings: No rash.  Neurological:     Mental Status: She is alert and oriented to person, place, and time.  Psychiatric:        Behavior: Behavior normal.      Vitals:    02/25/20 1204  BP: (!) 154/78  Pulse: 93  Temp: 98.8 F (37.1 C)  TempSrc: Temporal  SpO2: 96%  Weight: 134 lb (60.8 kg)  Height: 5' 3"  (1.6 m)   96% on RA BMI Readings from Last 3 Encounters:  02/25/20 23.74 kg/m  01/09/20 23.52 kg/m  11/12/19 23.38 kg/m   Wt Readings from Last 3 Encounters:  02/25/20 134 lb (60.8 kg)  01/09/20 132 lb 12.8 oz (60.2 kg)  11/12/19 132 lb (59.9 kg)     CBC    Component Value Date/Time   WBC 10.3 01/09/2020 1339   WBC 9.1 10/22/2019 1200   RBC 2.73 (L) 01/09/2020 1340   RBC 2.82 (L) 01/09/2020 1339   HGB 10.0 (L) 01/09/2020 1339   HGB 10.8 (L) 01/03/2017 1246   HCT 30.5 (L) 01/09/2020 1339   HCT 32.8 (L) 01/03/2017 1246   PLT 119 (L) 01/09/2020 1339   PLT 163 01/03/2017 1246   MCV 108.2 (H) 01/09/2020 1339   MCV 102.8 (H) 01/03/2017 1246   MCH 35.5 (H) 01/09/2020 1339   MCHC 32.8 01/09/2020 1339   RDW 15.7 (H) 01/09/2020 1339   RDW 14.3 01/03/2017 1246   LYMPHSABS 4.9 (H) 01/09/2020 1339   LYMPHSABS 2.9 01/03/2017 1246   MONOABS 1.6 (H) 01/09/2020 1339   MONOABS 1.1 (H) 01/03/2017 1246   EOSABS 0.4 01/09/2020 1339   EOSABS 0.1 01/03/2017 1246   BASOSABS 0.0 01/09/2020 1339   BASOSABS 0.0 01/03/2017 1246    Chest Imaging: 07/07/2019: CT lung cancer screening: Lung RADS 4 a, right lower lobe patchy opacities.  Significant emphysema.  10/08/2019 low-dose lung cancer screening CT: Lung RADS 2, inferior lingular nodule resolved.  Now quit greater than 15 years no additional need for low-dose lung cancer screening.  Septal thickening in the bases concerning for potential ILD.  02/20/2020 CT chest: Multiple small pulmonary nodules 5 mm or less in size.  Stable from previous imaging evidence of mild centrilobular and paraseptal emphysema. The patient's images have been independently reviewed by me.    Pulmonary Functions Testing Results: PFT Results Latest Ref Rng & Units 01/26/2016  FVC-Pre L 2.31  FVC-Predicted Pre % 79   FVC-Post L 2.45  FVC-Predicted Post % 84  Pre FEV1/FVC % % 69  Post FEV1/FCV % % 69  FEV1-Pre L 1.60  FEV1-Predicted Pre % 72  FEV1-Post L 1.70  DLCO UNC% % 56  DLCO COR %Predicted % 70  TLC L 4.25  TLC % Predicted % 86  RV % Predicted % 84    FeNO: None   Pathology: None  Echocardiogram: None   Heart Catheterization: None     Assessment & Plan:     ICD-10-CM   1. Stage 2 moderate COPD by GOLD classification (HCC)  J44.9 Tiotropium Bromide-Olodaterol (STIOLTO RESPIMAT) 2.5-2.5 MCG/ACT AERS  2. 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 Infliximab (Remicade) long-term use  Z79.899   4. Abnormal CT lung screening  R91.8   5. Multiple pulmonary nodules  R91.8 CT Chest Wo Contrast    Tiotropium Bromide-Olodaterol (STIOLTO RESPIMAT) 2.5-2.5 MCG/ACT AERS    Discussion:  This is a 74 year old female initially seen for abnormal lung cancer  screening CT.  She had multiple bilateral lung nodules.  Recently had HRCT imaging which revealed stability of these nodules.  She does have a significant smoking history and due to this would qualify as high risk and will need 1 year follow-up.  No significant evidence of interstitial lung disease even though she does have history of RA.  Currently managed by rheumatology.  Today in the office we reviewed this HRCT that was completed last month.  Please see interpretation above.  Plan: I have ordered a repeat noncontrasted CT of the chest. I have reordered full PFTs to be completed prior to next office visit. Continue current inhaler regimen Continue Stiolto, new refills for this and today. Continue infliximab plus prednisone RA management per rheumatology.  Return to clinic in 1 year or as needed based on symptoms.  Following CT imaging and PFTs.  Rater than 50% of this patient's 31-minute office visit was spent face-to-face discussing above recommendations and treatment  plan.    Current Outpatient Medications:  .  acetaminophen (TYLENOL) 500 MG tablet, Take 1,000 mg by mouth daily as needed for mild pain. , Disp: , Rfl:  .  B Complex-C (B-COMPLEX WITH VITAMIN C) tablet, Take 1 tablet by mouth daily., Disp: , Rfl:  .  Calcium Carb-Cholecalciferol (CALCIUM-VITAMIN D) 600-400 MG-UNIT TABS, Take 2 tablets by mouth daily., Disp: , Rfl:  .  cholecalciferol (VITAMIN D) 1000 UNITS tablet, Take 1,000 Units by mouth daily., Disp: , Rfl:  .  denosumab (PROLIA) 60 MG/ML SOSY injection, Inject 60 mg into the skin every 6 (six) months., Disp: , Rfl:  .  gabapentin (NEURONTIN) 300 MG capsule, Take 300 mg by mouth 3 (three) times daily. , Disp: , Rfl:  .  InFLIXimab (REMICADE IV), Inject 10 mg into the vein as directed. Every 2 months, Disp: , Rfl:  .  loratadine (CLARITIN) 10 MG tablet, Take 5 mg by mouth daily., Disp: , Rfl:  .  losartan (COZAAR) 50 MG tablet, Take 50 mg by mouth daily. , Disp: , Rfl:  .  metoprolol succinate (TOPROL-XL) 50 MG 24 hr tablet, Take 50 mg by mouth every morning. , Disp: , Rfl:  .  predniSONE (DELTASONE) 5 MG tablet, Take 5 mg by mouth every other day. , Disp: , Rfl:  .  pseudoephedrine-acetaminophen (TYLENOL SINUS) 30-500 MG TABS tablet, Take 1 tablet by mouth daily as needed (sinus headaches)., Disp: , Rfl:  .  RUTIN-50 PO, Take 50 mg by mouth daily., Disp: , Rfl:  .  Tiotropium Bromide-Olodaterol (STIOLTO RESPIMAT) 2.5-2.5 MCG/ACT AERS, Inhale 2 puffs into the lungs daily., Disp: , Rfl:  .  vitamin C (ASCORBIC ACID) 500 MG tablet, Take 500 mg by mouth daily., Disp: , Rfl:  .  Wheat Dextrin (BENEFIBER) POWD, Take 1 Dose by mouth daily., Disp: , Rfl:    Garner Nash, DO [facility] 02/25/2020 12:24 PM

## 2020-03-02 DIAGNOSIS — M0579 Rheumatoid arthritis with rheumatoid factor of multiple sites without organ or systems involvement: Secondary | ICD-10-CM | POA: Diagnosis not present

## 2020-03-04 DIAGNOSIS — Z6823 Body mass index (BMI) 23.0-23.9, adult: Secondary | ICD-10-CM | POA: Diagnosis not present

## 2020-03-04 DIAGNOSIS — M0579 Rheumatoid arthritis with rheumatoid factor of multiple sites without organ or systems involvement: Secondary | ICD-10-CM | POA: Diagnosis not present

## 2020-03-04 DIAGNOSIS — M2550 Pain in unspecified joint: Secondary | ICD-10-CM | POA: Diagnosis not present

## 2020-03-04 DIAGNOSIS — Z79899 Other long term (current) drug therapy: Secondary | ICD-10-CM | POA: Diagnosis not present

## 2020-03-04 DIAGNOSIS — M25512 Pain in left shoulder: Secondary | ICD-10-CM | POA: Diagnosis not present

## 2020-03-04 DIAGNOSIS — E790 Hyperuricemia without signs of inflammatory arthritis and tophaceous disease: Secondary | ICD-10-CM | POA: Diagnosis not present

## 2020-03-11 DIAGNOSIS — M79645 Pain in left finger(s): Secondary | ICD-10-CM | POA: Diagnosis not present

## 2020-03-11 DIAGNOSIS — Z6823 Body mass index (BMI) 23.0-23.9, adult: Secondary | ICD-10-CM | POA: Diagnosis not present

## 2020-03-11 DIAGNOSIS — M25512 Pain in left shoulder: Secondary | ICD-10-CM | POA: Diagnosis not present

## 2020-03-11 DIAGNOSIS — Z79899 Other long term (current) drug therapy: Secondary | ICD-10-CM | POA: Diagnosis not present

## 2020-03-11 DIAGNOSIS — M2550 Pain in unspecified joint: Secondary | ICD-10-CM | POA: Diagnosis not present

## 2020-03-11 DIAGNOSIS — M0579 Rheumatoid arthritis with rheumatoid factor of multiple sites without organ or systems involvement: Secondary | ICD-10-CM | POA: Diagnosis not present

## 2020-03-11 DIAGNOSIS — E790 Hyperuricemia without signs of inflammatory arthritis and tophaceous disease: Secondary | ICD-10-CM | POA: Diagnosis not present

## 2020-03-29 DIAGNOSIS — N1831 Chronic kidney disease, stage 3a: Secondary | ICD-10-CM | POA: Diagnosis not present

## 2020-03-29 DIAGNOSIS — M069 Rheumatoid arthritis, unspecified: Secondary | ICD-10-CM | POA: Diagnosis not present

## 2020-03-29 DIAGNOSIS — I129 Hypertensive chronic kidney disease with stage 1 through stage 4 chronic kidney disease, or unspecified chronic kidney disease: Secondary | ICD-10-CM | POA: Diagnosis not present

## 2020-03-29 DIAGNOSIS — M353 Polymyalgia rheumatica: Secondary | ICD-10-CM | POA: Diagnosis not present

## 2020-03-29 DIAGNOSIS — M810 Age-related osteoporosis without current pathological fracture: Secondary | ICD-10-CM | POA: Diagnosis not present

## 2020-03-29 DIAGNOSIS — M545 Low back pain: Secondary | ICD-10-CM | POA: Diagnosis not present

## 2020-04-27 DIAGNOSIS — M0579 Rheumatoid arthritis with rheumatoid factor of multiple sites without organ or systems involvement: Secondary | ICD-10-CM | POA: Diagnosis not present

## 2020-05-18 DIAGNOSIS — M5136 Other intervertebral disc degeneration, lumbar region: Secondary | ICD-10-CM | POA: Diagnosis not present

## 2020-05-26 ENCOUNTER — Ambulatory Visit
Admission: RE | Admit: 2020-05-26 | Discharge: 2020-05-26 | Disposition: A | Discharge status: Home / Self Care | Payer: Medicare Other | Source: Ambulatory Visit | Attending: Pulmonary Disease | Admitting: Pulmonary Disease

## 2020-05-26 ENCOUNTER — Other Ambulatory Visit: Payer: Self-pay

## 2020-05-26 DIAGNOSIS — R918 Other nonspecific abnormal finding of lung field: Secondary | ICD-10-CM | POA: Diagnosis not present

## 2020-06-02 ENCOUNTER — Telehealth: Payer: Self-pay | Admitting: Pulmonary Disease

## 2020-06-02 NOTE — Progress Notes (Signed)
Patient identification verified. Results of recent chest CT reviewed.  Per Dr. Valeta Harms, the lung nodules that we were following have resolved. No additional follow up needed.   Patient verbalized understanding of results.

## 2020-06-02 NOTE — Telephone Encounter (Signed)
Patient called, results provided, see result note.

## 2020-06-04 DIAGNOSIS — E790 Hyperuricemia without signs of inflammatory arthritis and tophaceous disease: Secondary | ICD-10-CM | POA: Diagnosis not present

## 2020-06-04 DIAGNOSIS — Z79899 Other long term (current) drug therapy: Secondary | ICD-10-CM | POA: Diagnosis not present

## 2020-06-04 DIAGNOSIS — Z6823 Body mass index (BMI) 23.0-23.9, adult: Secondary | ICD-10-CM | POA: Diagnosis not present

## 2020-06-04 DIAGNOSIS — M0579 Rheumatoid arthritis with rheumatoid factor of multiple sites without organ or systems involvement: Secondary | ICD-10-CM | POA: Diagnosis not present

## 2020-06-04 DIAGNOSIS — M25512 Pain in left shoulder: Secondary | ICD-10-CM | POA: Diagnosis not present

## 2020-06-04 DIAGNOSIS — M2550 Pain in unspecified joint: Secondary | ICD-10-CM | POA: Diagnosis not present

## 2020-07-26 DIAGNOSIS — M5416 Radiculopathy, lumbar region: Secondary | ICD-10-CM | POA: Diagnosis not present

## 2020-07-26 DIAGNOSIS — M5136 Other intervertebral disc degeneration, lumbar region: Secondary | ICD-10-CM | POA: Diagnosis not present

## 2020-08-17 DIAGNOSIS — Z23 Encounter for immunization: Secondary | ICD-10-CM | POA: Diagnosis not present

## 2020-08-17 DIAGNOSIS — M0579 Rheumatoid arthritis with rheumatoid factor of multiple sites without organ or systems involvement: Secondary | ICD-10-CM | POA: Diagnosis not present

## 2020-08-25 DIAGNOSIS — Z1231 Encounter for screening mammogram for malignant neoplasm of breast: Secondary | ICD-10-CM | POA: Diagnosis not present

## 2020-08-25 DIAGNOSIS — M8589 Other specified disorders of bone density and structure, multiple sites: Secondary | ICD-10-CM | POA: Diagnosis not present

## 2020-09-09 DIAGNOSIS — R928 Other abnormal and inconclusive findings on diagnostic imaging of breast: Secondary | ICD-10-CM | POA: Diagnosis not present

## 2020-09-09 DIAGNOSIS — R922 Inconclusive mammogram: Secondary | ICD-10-CM | POA: Diagnosis not present

## 2020-10-05 ENCOUNTER — Other Ambulatory Visit: Payer: Self-pay | Admitting: Radiology

## 2020-10-05 DIAGNOSIS — R921 Mammographic calcification found on diagnostic imaging of breast: Secondary | ICD-10-CM | POA: Diagnosis not present

## 2020-10-05 DIAGNOSIS — N6012 Diffuse cystic mastopathy of left breast: Secondary | ICD-10-CM | POA: Diagnosis not present

## 2020-10-08 DIAGNOSIS — J449 Chronic obstructive pulmonary disease, unspecified: Secondary | ICD-10-CM | POA: Diagnosis not present

## 2020-10-08 DIAGNOSIS — K219 Gastro-esophageal reflux disease without esophagitis: Secondary | ICD-10-CM | POA: Diagnosis not present

## 2020-10-08 DIAGNOSIS — K625 Hemorrhage of anus and rectum: Secondary | ICD-10-CM | POA: Diagnosis not present

## 2020-10-08 DIAGNOSIS — Z23 Encounter for immunization: Secondary | ICD-10-CM | POA: Diagnosis not present

## 2020-10-08 DIAGNOSIS — M069 Rheumatoid arthritis, unspecified: Secondary | ICD-10-CM | POA: Diagnosis not present

## 2020-10-08 DIAGNOSIS — M810 Age-related osteoporosis without current pathological fracture: Secondary | ICD-10-CM | POA: Diagnosis not present

## 2020-10-08 DIAGNOSIS — M353 Polymyalgia rheumatica: Secondary | ICD-10-CM | POA: Diagnosis not present

## 2020-10-08 DIAGNOSIS — Z0000 Encounter for general adult medical examination without abnormal findings: Secondary | ICD-10-CM | POA: Diagnosis not present

## 2020-10-08 DIAGNOSIS — E7800 Pure hypercholesterolemia, unspecified: Secondary | ICD-10-CM | POA: Diagnosis not present

## 2020-10-08 DIAGNOSIS — I129 Hypertensive chronic kidney disease with stage 1 through stage 4 chronic kidney disease, or unspecified chronic kidney disease: Secondary | ICD-10-CM | POA: Diagnosis not present

## 2020-10-08 DIAGNOSIS — D693 Immune thrombocytopenic purpura: Secondary | ICD-10-CM | POA: Diagnosis not present

## 2020-10-08 DIAGNOSIS — D509 Iron deficiency anemia, unspecified: Secondary | ICD-10-CM | POA: Diagnosis not present

## 2020-10-12 DIAGNOSIS — M0579 Rheumatoid arthritis with rheumatoid factor of multiple sites without organ or systems involvement: Secondary | ICD-10-CM | POA: Diagnosis not present

## 2020-10-21 DIAGNOSIS — K900 Celiac disease: Secondary | ICD-10-CM | POA: Diagnosis not present

## 2020-10-21 DIAGNOSIS — K625 Hemorrhage of anus and rectum: Secondary | ICD-10-CM | POA: Diagnosis not present

## 2020-10-21 DIAGNOSIS — K5730 Diverticulosis of large intestine without perforation or abscess without bleeding: Secondary | ICD-10-CM | POA: Diagnosis not present

## 2020-10-21 DIAGNOSIS — Z1211 Encounter for screening for malignant neoplasm of colon: Secondary | ICD-10-CM | POA: Diagnosis not present

## 2020-12-02 DIAGNOSIS — T169XXA Foreign body in ear, unspecified ear, initial encounter: Secondary | ICD-10-CM | POA: Diagnosis not present

## 2020-12-02 DIAGNOSIS — H6123 Impacted cerumen, bilateral: Secondary | ICD-10-CM | POA: Diagnosis not present

## 2020-12-02 DIAGNOSIS — I129 Hypertensive chronic kidney disease with stage 1 through stage 4 chronic kidney disease, or unspecified chronic kidney disease: Secondary | ICD-10-CM | POA: Diagnosis not present

## 2020-12-06 DIAGNOSIS — E790 Hyperuricemia without signs of inflammatory arthritis and tophaceous disease: Secondary | ICD-10-CM | POA: Diagnosis not present

## 2020-12-06 DIAGNOSIS — M150 Primary generalized (osteo)arthritis: Secondary | ICD-10-CM | POA: Diagnosis not present

## 2020-12-06 DIAGNOSIS — Z79899 Other long term (current) drug therapy: Secondary | ICD-10-CM | POA: Diagnosis not present

## 2020-12-06 DIAGNOSIS — Z6823 Body mass index (BMI) 23.0-23.9, adult: Secondary | ICD-10-CM | POA: Diagnosis not present

## 2020-12-06 DIAGNOSIS — M0579 Rheumatoid arthritis with rheumatoid factor of multiple sites without organ or systems involvement: Secondary | ICD-10-CM | POA: Diagnosis not present

## 2020-12-06 DIAGNOSIS — M2550 Pain in unspecified joint: Secondary | ICD-10-CM | POA: Diagnosis not present

## 2020-12-07 DIAGNOSIS — M0579 Rheumatoid arthritis with rheumatoid factor of multiple sites without organ or systems involvement: Secondary | ICD-10-CM | POA: Diagnosis not present

## 2020-12-28 DIAGNOSIS — M19112 Post-traumatic osteoarthritis, left shoulder: Secondary | ICD-10-CM | POA: Diagnosis not present

## 2020-12-28 DIAGNOSIS — M25512 Pain in left shoulder: Secondary | ICD-10-CM | POA: Diagnosis not present

## 2021-01-03 ENCOUNTER — Telehealth: Payer: Self-pay | Admitting: Hematology

## 2021-01-03 NOTE — Telephone Encounter (Signed)
Rescheduled appts per 1/10 email. Pt confirmed new appt date and time.

## 2021-01-07 ENCOUNTER — Other Ambulatory Visit: Payer: Medicare Other

## 2021-01-07 ENCOUNTER — Ambulatory Visit: Payer: Medicare Other | Admitting: Hematology

## 2021-01-18 ENCOUNTER — Other Ambulatory Visit: Payer: Self-pay | Admitting: Nurse Practitioner

## 2021-01-18 DIAGNOSIS — D638 Anemia in other chronic diseases classified elsewhere: Secondary | ICD-10-CM

## 2021-01-18 DIAGNOSIS — D696 Thrombocytopenia, unspecified: Secondary | ICD-10-CM

## 2021-01-19 NOTE — Progress Notes (Signed)
[facility]   [phone] Fax:(336) [phone]   Clinic Follow up Note   Patient Care Team: [name], MD as PCP - General (Family Medicine) Gavin Pound, MD as Consulting Physician (Rheumatology)  Date of Service:  01/20/2021  CHIEF COMPLAINT: F/u of Anemia and thrombocytopenia    CURRENT THERAPY:  Observation  INTERVAL HISTORY:  [name] is here for a follow up. She was last seen by me 1 year ago. She presents to the clinic alone. She is doing well overall She had right shoulder replacement 3 years ago, and feels she needs left shoulder replaced also, but will hold on it for now. She has chronic low back and knee pain, manageable  No bleeding  Energy is good, she exercise 30 mins a day She and her husband have been staying home due to the [place] pandemic   All other systems were reviewed with the patient and are negative.  MEDICAL HISTORY:  Past Medical History:  Diagnosis Date  . Arthritis    Rheumatoid arthritis  . Celiac disease   . Chronic kidney disease    stage 3 from MD notes  . COPD (chronic obstructive pulmonary disease) ([place])   . Dyspnea    with going up stairs  . Family history of adverse reaction to anesthesia    father had hard time waking up  . Headache    sinus headaches  . Hot flashes   . Hypertension   . Iron deficiency anemia   . Pneumonia    per patient "I have walking pneumonia"    SURGICAL HISTORY: Past Surgical History:  Procedure Laterality Date  . COLONOSCOPY    . ECTOPIC PREGNANCY SURGERY      x 2  . REVERSE SHOULDER ARTHROPLASTY Right 02/02/2017   Procedure: RIGHT REVERSE SHOULDER ARTHROPLASTY;  Surgeon: Netta Cedars, MD;  Location: [facility];  Service: Orthopedics;  Laterality: Right;    I have reviewed the social history and family history with the patient and they are unchanged from previous note.  ALLERGIES:  is allergic to gluten meal, penicillins, alendronate, and  hydroxychloroquine.  MEDICATIONS:  Current Outpatient Medications  Medication Sig Dispense Refill  . acetaminophen (TYLENOL) 500 MG tablet Take 1,000 mg by mouth daily as needed for mild pain.     . B Complex-C (B-COMPLEX WITH VITAMIN C) tablet Take 1 tablet by mouth daily.    . Calcium Carb-Cholecalciferol (CALCIUM-VITAMIN D) 600-400 MG-UNIT TABS Take 2 tablets by mouth daily.    . cholecalciferol (VITAMIN D) 1000 UNITS tablet Take 1,000 Units by mouth daily.    Marland Kitchen denosumab (PROLIA) 60 MG/ML SOSY injection Inject 60 mg into the skin every 6 (six) months.    . gabapentin (NEURONTIN) 300 MG capsule Take 300 mg by mouth 3 (three) times daily.     . InFLIXimab (REMICADE IV) Inject 10 mg into the vein as directed. Every 2 months    . loratadine (CLARITIN) 10 MG tablet Take 5 mg by mouth daily.    Marland Kitchen losartan (COZAAR) 50 MG tablet Take 50 mg by mouth daily.     . metoprolol succinate (TOPROL-XL) 50 MG 24 hr tablet Take 50 mg by mouth every morning.     . predniSONE (DELTASONE) 5 MG tablet Take 5 mg by mouth every other day.     . pseudoephedrine-acetaminophen (TYLENOL SINUS) 30-500 MG TABS tablet Take 1 tablet by mouth daily as needed (sinus headaches).    . RUTIN-50 PO Take 50 mg by  mouth daily.    . Tiotropium Bromide-Olodaterol (STIOLTO RESPIMAT) 2.5-2.5 MCG/ACT AERS Inhale 2 puffs into the lungs daily. 4 g 11  . vitamin C (ASCORBIC ACID) 500 MG tablet Take 500 mg by mouth daily.    . Wheat Dextrin (BENEFIBER) POWD Take 1 Dose by mouth daily.     No current facility-administered medications for this visit.    PHYSICAL EXAMINATION: ECOG PERFORMANCE STATUS: 1 - Symptomatic but completely ambulatory  Vitals:   01/20/21 1319  BP: (!) 141/73  Pulse: 96  Resp: 15  Temp: 97.9 F (36.6 C)  SpO2: 98%   Filed Weights   01/20/21 1319  Weight: 129 lb 11.2 oz (58.8 kg)    GENERAL:alert, no distress and comfortable SKIN: skin color, texture, turgor are normal, no rashes or significant  lesions EYES: normal, Conjunctiva are pink and non-injected, sclera clear Musculoskeletal:no cyanosis of digits and no clubbing  NEURO: alert & oriented x 3 with fluent speech, no focal motor/sensory deficits  LABORATORY DATA:  I have reviewed the data as listed CBC Latest Ref Rng & Units 01/20/2021 01/09/2020 10/22/2019  WBC 4.0 - 10.5 K/uL 7.8 10.3 9.1  Hemoglobin 12.0 - 15.0 g/dL 10.0(L) 10.0(L) 9.6(L)  Hematocrit 36.0 - 46.0 % 30.5(L) 30.5(L) 29.4(L)  Platelets 150 - 400 K/uL 110(L) 119(L) 113(L)     CMP Latest Ref Rng & Units 01/09/2020 10/22/2019 02/03/2017  Glucose 70 - 99 mg/dL 87 104(H) 109(H)  BUN 8 - 23 mg/dL _0 Creatinine 0.44 - 1.00 mg/dL 1.12(H) 1.42(H) 1.05(H)  Sodium 135 - 145 mmol/L 137 131(L) 135  Potassium 3.5 - 5.1 mmol/L 4.0 4.4 3.7  Chloride 98 - 111 mmol/L 102 97(L) 99(L)  CO2 22 - 32 mmol/L _1 Calcium 8.9 - 10.3 mg/dL 9.4 9.8 8.6(L)  Total Protein 6.5 - 8.1 g/dL 7.1 6.7 -  Total Bilirubin 0.3 - 1.2 mg/dL 0.5 0.9 -  Alkaline Phos 38 - 126 U/L 49 36(L) -  AST 15 - 41 U/L 25 34 -  ALT 0 - 44 U/L 17 20 -      RADIOGRAPHIC STUDIES: I have personally reviewed the radiological images as listed and agreed with the findings in the report. No results found.   ASSESSMENT & PLAN:  [name] is a 75 y.o. female with    1. 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 SPEP and UPEP with immunofixation was negative. No lab evidence of hemolysis, erythropoietin level is normal. Sedimentation rate was normal, no evidence of chronic inflammation. But she was diagnosed with rheumatoid arthritis, anemia of chronic disease is still most likely. 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 Her peripheral white count is normal, no elevated lymphocytes. Will consider repeat a bone marrow biopsy only if her blood counts get worse -CT scan from 03/2015, which was negative for adenopathy or splenomegaly. 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 No need for treatment given her mild thrombocytopenia. -She is clinically doing well, fatigue or bleeding -Lab reviewed, hemoglobin 10, platelet 110K, overall stable -Annual follow-up   2. Rheumatoid arthritis -She will continue follow-up with her primary care physician Dr Coral Spikes and rheumatologist Dr Lenna Gilford. -Sheunderwentright shoulder surgeryin  01/2017, arthritis in that location much improved. -Her current flare of pain is in her left hand.  -She is currently on Remicade q28month. She also takes vitamin supplements. She gets prolia injection q666monthwith her PCP.    Plan -lab and f/u with lacie in one year    No problem-specific Assessment & Plan notes found for this encounter.   No orders of the defined types were placed in this encounter.  All questions were answered. The patient knows to call the clinic with any problems, questions or concerns. No barriers to learning was detected. The total time spent in the appointment was 15 minutes.     YaTruitt MerleMD 01/20/2021   I, AmJoslyn Devonam acting as scribe for YaTruitt MerleMD.   I have reviewed the above documentation for accuracy and completeness, and I agree with the above.

## 2021-01-20 ENCOUNTER — Other Ambulatory Visit: Payer: Self-pay

## 2021-01-20 ENCOUNTER — Inpatient Hospital Stay (HOSPITAL_BASED_OUTPATIENT_CLINIC_OR_DEPARTMENT_OTHER): Payer: Medicare Other | Admitting: Hematology

## 2021-01-20 ENCOUNTER — Inpatient Hospital Stay: Payer: Medicare Other | Attending: Hematology

## 2021-01-20 VITALS — BP 141/73 | HR 96 | Temp 97.9°F | Resp 15 | Wt 129.7 lb

## 2021-01-20 DIAGNOSIS — D638 Anemia in other chronic diseases classified elsewhere: Secondary | ICD-10-CM

## 2021-01-20 DIAGNOSIS — D649 Anemia, unspecified: Secondary | ICD-10-CM | POA: Diagnosis not present

## 2021-01-20 DIAGNOSIS — Z96611 Presence of right artificial shoulder joint: Secondary | ICD-10-CM | POA: Diagnosis not present

## 2021-01-20 DIAGNOSIS — M069 Rheumatoid arthritis, unspecified: Secondary | ICD-10-CM | POA: Insufficient documentation

## 2021-01-20 DIAGNOSIS — D696 Thrombocytopenia, unspecified: Secondary | ICD-10-CM

## 2021-01-20 DIAGNOSIS — Z79899 Other long term (current) drug therapy: Secondary | ICD-10-CM | POA: Diagnosis not present

## 2021-01-20 LAB — CMP (CANCER CENTER ONLY)
ALT: 19 U/L (ref 0–44)
AST: 23 U/L (ref 15–41)
Albumin: 4.1 g/dL (ref 3.5–5.0)
Alkaline Phosphatase: 46 U/L (ref 38–126)
Anion gap: 9 (ref 5–15)
BUN: 20 mg/dL (ref 8–23)
CO2: 27 mmol/L (ref 22–32)
Calcium: 10 mg/dL (ref 8.9–10.3)
Chloride: 101 mmol/L (ref 98–111)
Creatinine: 1.13 mg/dL — ABNORMAL HIGH (ref 0.44–1.00)
GFR, Estimated: 51 mL/min — ABNORMAL LOW (ref >60)
Glucose, Bld: 111 mg/dL — ABNORMAL HIGH (ref 70–99)
Potassium: 4.5 mmol/L (ref 3.5–5.1)
Sodium: 137 mmol/L (ref 135–145)
Total Bilirubin: 0.9 mg/dL (ref 0.3–1.2)
Total Protein: 7 g/dL (ref 6.5–8.1)

## 2021-01-20 LAB — CBC WITH DIFFERENTIAL (CANCER CENTER ONLY)
Abs Immature Granulocytes: 0.07 10*3/uL (ref 0.00–0.07)
Basophils Absolute: 0 10*3/uL (ref 0.0–0.1)
Basophils Relative: 0 %
Eosinophils Absolute: 0 10*3/uL (ref 0.0–0.5)
Eosinophils Relative: 1 %
HCT: 30.5 % — ABNORMAL LOW (ref 36.0–46.0)
Hemoglobin: 10 g/dL — ABNORMAL LOW (ref 12.0–15.0)
Immature Granulocytes: 1 %
Lymphocytes Relative: 28 %
Lymphs Abs: 2.2 10*3/uL (ref 0.7–4.0)
MCH: 35.6 pg — ABNORMAL HIGH (ref 26.0–34.0)
MCHC: 32.8 g/dL (ref 30.0–36.0)
MCV: 108.5 fL — ABNORMAL HIGH (ref 80.0–100.0)
Monocytes Absolute: 1.6 10*3/uL — ABNORMAL HIGH (ref 0.1–1.0)
Monocytes Relative: 21 %
Neutro Abs: 3.9 10*3/uL (ref 1.7–7.7)
Neutrophils Relative %: 49 %
Platelet Count: 110 10*3/uL — ABNORMAL LOW (ref 150–400)
RBC: 2.81 MIL/uL — ABNORMAL LOW (ref 3.87–5.11)
RDW: 16.4 % — ABNORMAL HIGH (ref 11.5–15.5)
WBC Count: 7.8 10*3/uL (ref 4.0–10.5)
nRBC: 0 % (ref 0.0–0.2)

## 2021-01-20 LAB — RETICULOCYTES
Immature Retic Fract: 24.7 % — ABNORMAL HIGH (ref 2.3–15.9)
RBC.: 2.77 MIL/uL — ABNORMAL LOW (ref 3.87–5.11)
Retic Count, Absolute: 70.4 10*3/uL (ref 19.0–186.0)
Retic Ct Pct: 2.5 % (ref 0.4–3.1)

## 2021-01-21 ENCOUNTER — Telehealth: Payer: Self-pay | Admitting: Hematology

## 2021-01-21 ENCOUNTER — Encounter: Payer: Self-pay | Admitting: Hematology

## 2021-01-21 NOTE — Telephone Encounter (Signed)
Scheduled appointments per 1/27 los. Will mail updated calendar to patient.

## 2021-02-01 DIAGNOSIS — M0579 Rheumatoid arthritis with rheumatoid factor of multiple sites without organ or systems involvement: Secondary | ICD-10-CM | POA: Diagnosis not present

## 2021-02-14 ENCOUNTER — Telehealth: Payer: Self-pay | Admitting: Nurse Practitioner

## 2021-02-14 NOTE — Telephone Encounter (Signed)
Left message that upcoming appointment was changed to virtual and was moved per provider's template. Gave option to call back to reschedule if needed.

## 2021-03-28 DIAGNOSIS — D225 Melanocytic nevi of trunk: Secondary | ICD-10-CM | POA: Diagnosis not present

## 2021-03-28 DIAGNOSIS — I8311 Varicose veins of right lower extremity with inflammation: Secondary | ICD-10-CM | POA: Diagnosis not present

## 2021-03-28 DIAGNOSIS — I8312 Varicose veins of left lower extremity with inflammation: Secondary | ICD-10-CM | POA: Diagnosis not present

## 2021-03-28 DIAGNOSIS — L821 Other seborrheic keratosis: Secondary | ICD-10-CM | POA: Diagnosis not present

## 2021-03-28 DIAGNOSIS — L820 Inflamed seborrheic keratosis: Secondary | ICD-10-CM | POA: Diagnosis not present

## 2021-03-28 DIAGNOSIS — I872 Venous insufficiency (chronic) (peripheral): Secondary | ICD-10-CM | POA: Diagnosis not present

## 2021-03-28 DIAGNOSIS — D692 Other nonthrombocytopenic purpura: Secondary | ICD-10-CM | POA: Diagnosis not present

## 2021-03-31 ENCOUNTER — Telehealth: Payer: Self-pay | Admitting: Pulmonary Disease

## 2021-03-31 DIAGNOSIS — J449 Chronic obstructive pulmonary disease, unspecified: Secondary | ICD-10-CM

## 2021-03-31 DIAGNOSIS — R918 Other nonspecific abnormal finding of lung field: Secondary | ICD-10-CM

## 2021-03-31 MED ORDER — STIOLTO RESPIMAT 2.5-2.5 MCG/ACT IN AERS: 2.0000 | INHALATION_SPRAY | Freq: Every day | RESPIRATORY_TRACT | 0 refills | Status: DC

## 2021-03-31 NOTE — Telephone Encounter (Signed)
Sent in 1 months supply of Stiolto. Patient was last seen in office on 02/25/20. Added message on prescription that patient would need to schedule an appointment for any additional refills.  Nothing further needed at this time.

## 2021-04-05 DIAGNOSIS — R5382 Chronic fatigue, unspecified: Secondary | ICD-10-CM | POA: Diagnosis not present

## 2021-04-05 DIAGNOSIS — Z79899 Other long term (current) drug therapy: Secondary | ICD-10-CM | POA: Diagnosis not present

## 2021-04-05 DIAGNOSIS — M0579 Rheumatoid arthritis with rheumatoid factor of multiple sites without organ or systems involvement: Secondary | ICD-10-CM | POA: Diagnosis not present

## 2021-04-11 DIAGNOSIS — N1831 Chronic kidney disease, stage 3a: Secondary | ICD-10-CM | POA: Diagnosis not present

## 2021-04-11 DIAGNOSIS — D509 Iron deficiency anemia, unspecified: Secondary | ICD-10-CM | POA: Diagnosis not present

## 2021-04-11 DIAGNOSIS — M069 Rheumatoid arthritis, unspecified: Secondary | ICD-10-CM | POA: Diagnosis not present

## 2021-04-11 DIAGNOSIS — M810 Age-related osteoporosis without current pathological fracture: Secondary | ICD-10-CM | POA: Diagnosis not present

## 2021-04-11 DIAGNOSIS — E7800 Pure hypercholesterolemia, unspecified: Secondary | ICD-10-CM | POA: Diagnosis not present

## 2021-04-11 DIAGNOSIS — I129 Hypertensive chronic kidney disease with stage 1 through stage 4 chronic kidney disease, or unspecified chronic kidney disease: Secondary | ICD-10-CM | POA: Diagnosis not present

## 2021-04-28 DIAGNOSIS — M25552 Pain in left hip: Secondary | ICD-10-CM | POA: Diagnosis not present

## 2021-04-30 DIAGNOSIS — Z20822 Contact with and (suspected) exposure to covid-19: Secondary | ICD-10-CM | POA: Diagnosis not present

## 2021-05-03 ENCOUNTER — Ambulatory Visit (INDEPENDENT_AMBULATORY_CARE_PROVIDER_SITE_OTHER): Payer: Medicare Other | Admitting: Primary Care

## 2021-05-03 ENCOUNTER — Ambulatory Visit: Payer: Medicare Other | Admitting: Pulmonary Disease

## 2021-05-03 ENCOUNTER — Other Ambulatory Visit: Payer: Self-pay

## 2021-05-03 ENCOUNTER — Encounter: Payer: Self-pay | Admitting: Primary Care

## 2021-05-03 ENCOUNTER — Ambulatory Visit (INDEPENDENT_AMBULATORY_CARE_PROVIDER_SITE_OTHER): Payer: Medicare Other | Admitting: Pulmonary Disease

## 2021-05-03 DIAGNOSIS — R918 Other nonspecific abnormal finding of lung field: Secondary | ICD-10-CM

## 2021-05-03 DIAGNOSIS — J449 Chronic obstructive pulmonary disease, unspecified: Secondary | ICD-10-CM

## 2021-05-03 DIAGNOSIS — Z87898 Personal history of other specified conditions: Secondary | ICD-10-CM | POA: Diagnosis not present

## 2021-05-03 LAB — PULMONARY FUNCTION TEST
DL/VA % pred: 72 %
DL/VA: 2.99 ml/min/mmHg/L
DLCO cor % pred: 55 %
DLCO cor: 10.27 ml/min/mmHg
DLCO unc % pred: 55 %
DLCO unc: 10.27 ml/min/mmHg
FEF 25-75 Post: 0.99 L/sec
FEF 25-75 Pre: 0.77 L/sec
FEF2575-%Change-Post: 28 %
FEF2575-%Pred-Post: 59 %
FEF2575-%Pred-Pre: 46 %
FEV1-%Change-Post: 1 %
FEV1-%Pred-Post: 73 %
FEV1-%Pred-Pre: 72 %
FEV1-Post: 1.51 L
FEV1-Pre: 1.49 L
FEV1FVC-%Change-Post: 0 %
FEV1FVC-%Pred-Pre: 90 %
FEV6-%Change-Post: 2 %
FEV6-%Pred-Post: 85 %
FEV6-%Pred-Pre: 83 %
FEV6-Post: 2.21 L
FEV6-Pre: 2.17 L
FEV6FVC-%Change-Post: 0 %
FEV6FVC-%Pred-Post: 105 %
FEV6FVC-%Pred-Pre: 104 %
FVC-%Change-Post: 1 %
FVC-%Pred-Post: 80 %
FVC-%Pred-Pre: 80 %
FVC-Post: 2.21 L
FVC-Pre: 2.19 L
Post FEV1/FVC ratio: 68 %
Post FEV6/FVC ratio: 100 %
Pre FEV1/FVC ratio: 68 %
Pre FEV6/FVC Ratio: 99 %
RV % pred: 100 %
RV: 2.23 L
TLC % pred: 89 %
TLC: 4.39 L

## 2021-05-03 MED ORDER — STIOLTO RESPIMAT 2.5-2.5 MCG/ACT IN AERS: 2.0000 | INHALATION_SPRAY | Freq: Every day | RESPIRATORY_TRACT | 11 refills | Status: DC

## 2021-05-03 NOTE — Patient Instructions (Signed)
Full PFT performed.

## 2021-05-03 NOTE — Progress Notes (Signed)
@Patient  ID: [name], female    DOB: 1946/01/12, 75 y.o.   MRN: [id number]  Chief Complaint  Patient presents with  . Follow-up    Stairs causes sob,    Referring provider: [name], MD  HPI: 75 year old female, former smoker quit in 2005 (30 pack year hx). 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 Patient of Dr. Valeta Harms, last seen on 02/25/20. LDCT scan 10/08/19 showed lung RADS 2, no longer considered suitable candidate for lung cancer screening as she quit > 15 years ago. No evidence of ILD despite hx RA. \ Following with rheumatology. Maintained on Stiolto respimat.  05/03/2021- Interim hx  Patient presents today for annual follow-up. States that he breathing has been stable.  She does get out of breath of breath going up stairs. She reports throat clearing in the morning which last 45 mins and does not persist. She takes Claritin every day. She went a few days without stiolto because she ran out of refills. CT chest on 05/26/20 showed resolution of previous nodules, felt to be postinflammatory. Mild stable centrilobular emphysematous changes. Denies cough, chest tightness or wheezing.   Pulmonary function testing: 02/04/16 - FVC 2.45 (84%), FEV1 1.70 (77%), ratio 69, DLCOunc 12.99L (56%)  05/03/2021- FVC 2.21 (80%), FEV1 1.51 (73%), ratio 68, DLCOunc 10.27L (55%)  Allergies  Allergen Reactions  . Gluten Meal Diarrhea  . Penicillins Rash    Has patient had a PCN reaction causing immediate rash, facial/tongue/throat swelling, SOB or lightheadedness with hypotension: Yes Has patient had a PCN reaction causing severe rash involving mucus membranes or skin necrosis: No Has patient had a PCN reaction that required hospitalization No Has patient had a PCN reaction occurring within the last 10 years: No If all of the above answers are "NO", then may proceed with Cephalosporin use.   . Alendronate  Rash  . Hydroxychloroquine Itching    Immunization History  Administered Date(s) Administered  . DTaP / Hep B / IPV 09/03/2018, 03/17/2019, 09/26/2019, 03/29/2020, 10/08/2020, 04/11/2021  . Fluad Quad(high Dose 65+) 09/25/2019  . H1N1 11/17/2008  . Influenza Split 10/28/2009, 10/27/2010  . Influenza, High Dose Seasonal PF 10/05/2015, 09/15/2016, 09/17/2017, 09/03/2018, 09/06/2019, 10/08/2020  . Influenza,inj,quad, With Preservative 10/29/2014  . Influenza-Unspecified 09/19/2012, 10/15/2013  . PFIZER(Purple Top)SARS-COV-2 Vaccination 01/16/2020, 02/06/2020, 08/16/2020  . Pneumococcal Conjugate-13 07/14/2014  . Pneumococcal Polysaccharide-23 07/09/2012  . Td 05/27/2004  . Tdap 07/09/2012, 11/10/2019  . Zoster 08/22/2012    Past Medical History:  Diagnosis Date  . Arthritis    Rheumatoid arthritis  . Celiac disease   . Chronic kidney disease    stage 3 from MD notes  . COPD (chronic obstructive pulmonary disease) ([place])   . Dyspnea    with going up stairs  . Family history of adverse reaction to anesthesia    father had hard time waking up  . Headache    sinus headaches  . Hot flashes   . Hypertension   . Iron deficiency anemia   . Pneumonia    per patient "I have walking pneumonia"    Tobacco History: Social History   Tobacco Use  Smoking Status Former Smoker  . Packs/day: 1.00  . Years: 30.00  . Pack years: 30.00  . Types: Cigarettes  . Quit date: 2005  . Years since quitting: 17.3  Smokeless Tobacco Never Used   Counseling given: Not Answered   Outpatient Medications Prior  to Visit  Medication Sig Dispense Refill  . acetaminophen (TYLENOL) 500 MG tablet Take 1,000 mg by mouth daily as needed for mild pain.     . B Complex-C (B-COMPLEX WITH VITAMIN C) tablet Take 1 tablet by mouth daily.    . Calcium Carb-Cholecalciferol (CALCIUM-VITAMIN D) 600-400 MG-UNIT TABS Take 2 tablets by mouth daily.    Marland Kitchen denosumab (PROLIA) 60 MG/ML SOSY injection Inject 60 mg into  the skin every 6 (six) months.    . gabapentin (NEURONTIN) 300 MG capsule Take 300 mg by mouth 3 (three) times daily.     . InFLIXimab (REMICADE IV) Inject 10 mg into the vein as directed. Every 2 months    . loratadine (CLARITIN) 10 MG tablet Take 5 mg by mouth daily.    Marland Kitchen losartan (COZAAR) 50 MG tablet Take 50 mg by mouth daily.     . metoprolol succinate (TOPROL-XL) 50 MG 24 hr tablet Take 50 mg by mouth every morning.     . predniSONE (DELTASONE) 5 MG tablet Take 5 mg by mouth every other day.     . pseudoephedrine-acetaminophen (TYLENOL SINUS) 30-500 MG TABS tablet Take 1 tablet by mouth daily as needed (sinus headaches).    . vitamin C (ASCORBIC ACID) 500 MG tablet Take 500 mg by mouth daily.    . Wheat Dextrin (BENEFIBER) POWD Take 1 Dose by mouth daily.    . Tiotropium Bromide-Olodaterol (STIOLTO RESPIMAT) 2.5-2.5 MCG/ACT AERS Inhale 2 puffs into the lungs daily. 4 g 0   No facility-administered medications prior to visit.   Review of Systems  Review of Systems  Constitutional: Negative.   HENT: Positive for postnasal drip.   Respiratory: Negative for cough, chest tightness, shortness of breath and wheezing.        Dyspnea   Cardiovascular: Negative.    Physical Exam  BP 110/68 (BP Location: Left Arm, Patient Position: Sitting, Cuff Size: Normal)   Pulse 83   Temp 98 F (36.7 C) (Temporal)   Ht 5' 1.69" (1.567 m)   Wt 133 lb (60.3 kg)   SpO2 98%   BMI 24.57 kg/m  Physical Exam Constitutional:      Appearance: Normal appearance.  HENT:     Head: Normocephalic and atraumatic.     Mouth/Throat:     Mouth: Mucous membranes are moist.     Pharynx: Oropharynx is clear.  Cardiovascular:     Rate and Rhythm: Normal rate and regular rhythm.  Pulmonary:     Effort: Pulmonary effort is normal.     Breath sounds: Normal breath sounds. No wheezing, rhonchi or rales.  Musculoskeletal:        General: Normal range of motion.  Skin:    General: Skin is warm and dry.   Neurological:     General: No focal deficit present.     Mental Status: She is alert and oriented to person, place, and time. Mental status is at baseline.  Psychiatric:        Mood and Affect: Mood normal.        Behavior: Behavior normal.        Thought Content: Thought content normal.        Judgment: Judgment normal.      Lab Results:  CBC    Component Value Date/Time   WBC 7.8 01/20/2021 1249   WBC 9.1 10/22/2019 1200   RBC 2.81 (L) 01/20/2021 1249   RBC 2.77 (L) 01/20/2021 1249   HGB 10.0 (L) 01/20/2021 1249  HGB 10.8 (L) 01/03/2017 1246   HCT 30.5 (L) 01/20/2021 1249   HCT 32.8 (L) 01/03/2017 1246   PLT 110 (L) 01/20/2021 1249   PLT 163 01/03/2017 1246   MCV 108.5 (H) 01/20/2021 1249   MCV 102.8 (H) 01/03/2017 1246   MCH 35.6 (H) 01/20/2021 1249   MCHC 32.8 01/20/2021 1249   RDW 16.4 (H) 01/20/2021 1249   RDW 14.3 01/03/2017 1246   LYMPHSABS 2.2 01/20/2021 1249   LYMPHSABS 2.9 01/03/2017 1246   MONOABS 1.6 (H) 01/20/2021 1249   MONOABS 1.1 (H) 01/03/2017 1246   EOSABS 0.0 01/20/2021 1249   EOSABS 0.1 01/03/2017 1246   BASOSABS 0.0 01/20/2021 1249   BASOSABS 0.0 01/03/2017 1246    BMET    Component Value Date/Time   NA 137 01/20/2021 1249   NA 141 01/04/2016 1001   K 4.5 01/20/2021 1249   K 4.5 01/04/2016 1001   CL 101 01/20/2021 1249   CO2 27 01/20/2021 1249   CO2 24 01/04/2016 1001   GLUCOSE 111 (H) 01/20/2021 1249   GLUCOSE 105 01/04/2016 1001   BUN 20 01/20/2021 1249   BUN 21.7 01/04/2016 1001   CREATININE 1.13 (H) 01/20/2021 1249   CREATININE 1.2 (H) 01/04/2016 1001   CALCIUM 10.0 01/20/2021 1249   CALCIUM 9.7 01/04/2016 1001   GFRNONAA 51 (L) 01/20/2021 1249   GFRAA 56 (L) 01/09/2020 1339    BNP No results found for: BNP  ProBNP No results found for: PROBNP  Imaging: No results found.   Assessment & Plan:   Stage 2 moderate COPD by GOLD classification ([place]) - Stable interval; No acute respiratory symptoms. No SABA use.   -  PFTs today show moderate obstructive airway disease, appears stable compared to 2017/ FEV1 1.51 (73%)  - Continue Stiolto respimat two puffs once daily - FU 1 year with Dr. Valeta Harms or sooner if symptoms worsen   Hx of multiple pulmonary nodules - No longer meets qualification for annual LDCT  - Most recent CT chest on 05/26/20 showed resolution of previous nodules, felt to be related to post inflammatory    Martyn Ehrich, NP 05/03/2021

## 2021-05-03 NOTE — Assessment & Plan Note (Addendum)
-   Stable interval; No acute respiratory symptoms. No SABA use.   - PFTs today show moderate obstructive airway disease, appears stable compared to 2017/ FEV1 1.51 (73%)  - Continue Stiolto respimat two puffs once daily - FU 1 year with Dr. Valeta Harms or sooner if symptoms worsen

## 2021-05-03 NOTE — Assessment & Plan Note (Signed)
-   No longer meets qualification for annual LDCT  - Most recent CT chest on 05/26/20 showed resolution of previous nodules, felt to be related to post inflammatory

## 2021-05-03 NOTE — Patient Instructions (Addendum)
Nice seeing you today Ms Slagel  Pulmonary function testing showed that your lung function has remained relatively stable since 2017  Pulmonary nodules resolved on most recent CT chest, no additional follow-up needed   Recommendations: Continue Stiolto two puff once daily in the morning Continue Claritin 37m once daily for allergy symptoms If needed you can try over the counter Flonase nasal spray for nasal congestion   Rx: Stiolto prescription sent to [facility]  Follow-up: 1 year with Dr. IValeta Harms(recall already placed)

## 2021-05-03 NOTE — Progress Notes (Signed)
Full PFT performed today. °

## 2021-05-06 NOTE — Progress Notes (Signed)
PCCM:  Thanks for seeing her.   Garner Nash, DO [facility] 05/06/2021 12:03 PM

## 2021-05-11 DIAGNOSIS — M7062 Trochanteric bursitis, left hip: Secondary | ICD-10-CM | POA: Diagnosis not present

## 2021-06-08 ENCOUNTER — Other Ambulatory Visit: Payer: Self-pay | Admitting: Family Medicine

## 2021-06-08 DIAGNOSIS — R600 Localized edema: Secondary | ICD-10-CM

## 2021-06-08 DIAGNOSIS — Z8249 Family history of ischemic heart disease and other diseases of the circulatory system: Secondary | ICD-10-CM

## 2021-06-08 DIAGNOSIS — I129 Hypertensive chronic kidney disease with stage 1 through stage 4 chronic kidney disease, or unspecified chronic kidney disease: Secondary | ICD-10-CM

## 2021-06-08 DIAGNOSIS — E7800 Pure hypercholesterolemia, unspecified: Secondary | ICD-10-CM

## 2021-06-09 DIAGNOSIS — E790 Hyperuricemia without signs of inflammatory arthritis and tophaceous disease: Secondary | ICD-10-CM | POA: Diagnosis not present

## 2021-06-09 DIAGNOSIS — M5442 Lumbago with sciatica, left side: Secondary | ICD-10-CM | POA: Diagnosis not present

## 2021-06-09 DIAGNOSIS — M0579 Rheumatoid arthritis with rheumatoid factor of multiple sites without organ or systems involvement: Secondary | ICD-10-CM | POA: Diagnosis not present

## 2021-06-09 DIAGNOSIS — M5410 Radiculopathy, site unspecified: Secondary | ICD-10-CM | POA: Diagnosis not present

## 2021-06-09 DIAGNOSIS — M150 Primary generalized (osteo)arthritis: Secondary | ICD-10-CM | POA: Diagnosis not present

## 2021-06-09 DIAGNOSIS — M2550 Pain in unspecified joint: Secondary | ICD-10-CM | POA: Diagnosis not present

## 2021-06-09 DIAGNOSIS — Z6823 Body mass index (BMI) 23.0-23.9, adult: Secondary | ICD-10-CM | POA: Diagnosis not present

## 2021-06-09 DIAGNOSIS — M545 Low back pain, unspecified: Secondary | ICD-10-CM | POA: Diagnosis not present

## 2021-06-09 DIAGNOSIS — Z79899 Other long term (current) drug therapy: Secondary | ICD-10-CM | POA: Diagnosis not present

## 2021-06-13 DIAGNOSIS — M0579 Rheumatoid arthritis with rheumatoid factor of multiple sites without organ or systems involvement: Secondary | ICD-10-CM | POA: Diagnosis not present

## 2021-06-15 DIAGNOSIS — M5416 Radiculopathy, lumbar region: Secondary | ICD-10-CM | POA: Diagnosis not present

## 2021-06-21 ENCOUNTER — Ambulatory Visit
Admission: RE | Admit: 2021-06-21 | Discharge: 2021-06-21 | Disposition: A | Discharge status: Home / Self Care | Payer: Medicare Other | Source: Ambulatory Visit | Attending: Family Medicine | Admitting: Family Medicine

## 2021-06-21 DIAGNOSIS — M25552 Pain in left hip: Secondary | ICD-10-CM | POA: Diagnosis not present

## 2021-06-21 DIAGNOSIS — E7800 Pure hypercholesterolemia, unspecified: Secondary | ICD-10-CM

## 2021-06-21 DIAGNOSIS — R600 Localized edema: Secondary | ICD-10-CM

## 2021-06-21 DIAGNOSIS — M25551 Pain in right hip: Secondary | ICD-10-CM | POA: Diagnosis not present

## 2021-06-21 DIAGNOSIS — Z8249 Family history of ischemic heart disease and other diseases of the circulatory system: Secondary | ICD-10-CM

## 2021-06-21 DIAGNOSIS — R262 Difficulty in walking, not elsewhere classified: Secondary | ICD-10-CM | POA: Diagnosis not present

## 2021-06-21 DIAGNOSIS — I129 Hypertensive chronic kidney disease with stage 1 through stage 4 chronic kidney disease, or unspecified chronic kidney disease: Secondary | ICD-10-CM

## 2021-06-28 DIAGNOSIS — M545 Low back pain, unspecified: Secondary | ICD-10-CM | POA: Diagnosis not present

## 2021-06-30 DIAGNOSIS — M5441 Lumbago with sciatica, right side: Secondary | ICD-10-CM | POA: Diagnosis not present

## 2021-06-30 DIAGNOSIS — M545 Low back pain, unspecified: Secondary | ICD-10-CM | POA: Diagnosis not present

## 2021-06-30 DIAGNOSIS — M5442 Lumbago with sciatica, left side: Secondary | ICD-10-CM | POA: Diagnosis not present

## 2021-07-04 DIAGNOSIS — M25559 Pain in unspecified hip: Secondary | ICD-10-CM | POA: Diagnosis not present

## 2021-07-05 ENCOUNTER — Other Ambulatory Visit (HOSPITAL_COMMUNITY): Payer: Self-pay | Admitting: Orthopedic Surgery

## 2021-07-05 DIAGNOSIS — R1909 Other intra-abdominal and pelvic swelling, mass and lump: Secondary | ICD-10-CM

## 2021-07-05 DIAGNOSIS — M5416 Radiculopathy, lumbar region: Secondary | ICD-10-CM | POA: Diagnosis not present

## 2021-07-06 ENCOUNTER — Encounter (HOSPITAL_COMMUNITY): Payer: Self-pay | Admitting: Radiology

## 2021-07-06 NOTE — Progress Notes (Signed)
Patient Demographics  Patient Name  [name], [name] Legal Sex  Female DOB  [birth year] SSN  [id number] Address  [address], [place] [address] Phone  [phone] (Home) *Preferred*  [phone] (Mobile)     RE: US Aspiration Received: Today Sandi Mariscal, MD  Garth Bigness D OK for CT guided Bx of pre-sacral mass.   Pelvic MRI - 07/05/21 - image 16, series 12.   Cathren Harsh         Previous Messages    ----- Message -----  From: Garth Bigness D  Sent: 07/05/2021   6:25 PM EDT  To: Ir Procedure Requests  Subject: US Aspiration                                   Procedure:   US Aspiration   Reason:  Presacral mass   History:  MR Pelvis in PAC's, [facility]   Provider:  Frederik Pear   Provider Contact:  [phone]

## 2021-07-08 ENCOUNTER — Telehealth (HOSPITAL_COMMUNITY): Payer: Self-pay | Admitting: Radiology

## 2021-07-08 NOTE — Telephone Encounter (Signed)
Tried to call pt to reschedule her biopsy scheduled for 07/13/21 due to an IR procedure with Dr. Pascal Lux on 07/13/21. No answer and no VM. JM

## 2021-07-11 DIAGNOSIS — M5416 Radiculopathy, lumbar region: Secondary | ICD-10-CM | POA: Diagnosis not present

## 2021-07-13 ENCOUNTER — Ambulatory Visit (HOSPITAL_COMMUNITY): Admission: RE | Admit: 2021-07-13 | Payer: Medicare Other | Source: Ambulatory Visit

## 2021-07-14 DIAGNOSIS — M5416 Radiculopathy, lumbar region: Secondary | ICD-10-CM | POA: Diagnosis not present

## 2021-07-19 ENCOUNTER — Other Ambulatory Visit: Payer: Self-pay | Admitting: Radiology

## 2021-07-20 ENCOUNTER — Emergency Department (HOSPITAL_COMMUNITY): Payer: Medicare Other

## 2021-07-20 ENCOUNTER — Other Ambulatory Visit: Payer: Self-pay

## 2021-07-20 ENCOUNTER — Encounter (HOSPITAL_COMMUNITY): Payer: Self-pay

## 2021-07-20 ENCOUNTER — Emergency Department (HOSPITAL_COMMUNITY)
Admission: EM | Admit: 2021-07-20 | Discharge: 2021-07-20 | Disposition: A | Discharge status: Home / Self Care | Payer: Medicare Other | Attending: Student | Admitting: Student

## 2021-07-20 ENCOUNTER — Ambulatory Visit (HOSPITAL_COMMUNITY)
Admission: RE | Admit: 2021-07-20 | Discharge: 2021-07-20 | Disposition: A | Discharge status: Home / Self Care | Payer: Medicare Other | Source: Ambulatory Visit | Attending: Orthopedic Surgery | Admitting: Orthopedic Surgery

## 2021-07-20 ENCOUNTER — Encounter (HOSPITAL_COMMUNITY): Payer: Self-pay | Admitting: Emergency Medicine

## 2021-07-20 DIAGNOSIS — R1909 Other intra-abdominal and pelvic swelling, mass and lump: Secondary | ICD-10-CM | POA: Insufficient documentation

## 2021-07-20 DIAGNOSIS — I4891 Unspecified atrial fibrillation: Secondary | ICD-10-CM | POA: Diagnosis not present

## 2021-07-20 DIAGNOSIS — J9811 Atelectasis: Secondary | ICD-10-CM | POA: Diagnosis not present

## 2021-07-20 DIAGNOSIS — Z5321 Procedure and treatment not carried out due to patient leaving prior to being seen by health care provider: Secondary | ICD-10-CM | POA: Insufficient documentation

## 2021-07-20 LAB — CBC WITH DIFFERENTIAL/PLATELET
Abs Immature Granulocytes: 0.04 10*3/uL (ref 0.00–0.07)
Basophils Absolute: 0 10*3/uL (ref 0.0–0.1)
Basophils Relative: 0 %
Eosinophils Absolute: 0.1 10*3/uL (ref 0.0–0.5)
Eosinophils Relative: 1 %
HCT: 29.6 % — ABNORMAL LOW (ref 36.0–46.0)
Hemoglobin: 9.3 g/dL — ABNORMAL LOW (ref 12.0–15.0)
Immature Granulocytes: 1 %
Lymphocytes Relative: 31 %
Lymphs Abs: 2.1 10*3/uL (ref 0.7–4.0)
MCH: 35.4 pg — ABNORMAL HIGH (ref 26.0–34.0)
MCHC: 31.4 g/dL (ref 30.0–36.0)
MCV: 112.5 fL — ABNORMAL HIGH (ref 80.0–100.0)
Monocytes Absolute: 2 10*3/uL — ABNORMAL HIGH (ref 0.1–1.0)
Monocytes Relative: 29 %
Neutro Abs: 2.6 10*3/uL (ref 1.7–7.7)
Neutrophils Relative %: 38 %
Platelets: 81 10*3/uL — ABNORMAL LOW (ref 150–400)
RBC: 2.63 MIL/uL — ABNORMAL LOW (ref 3.87–5.11)
RDW: 17.3 % — ABNORMAL HIGH (ref 11.5–15.5)
Smear Review: NORMAL
WBC: 6.8 10*3/uL (ref 4.0–10.5)
nRBC: 0 % (ref 0.0–0.2)

## 2021-07-20 LAB — CBC
HCT: 30.1 % — ABNORMAL LOW (ref 36.0–46.0)
Hemoglobin: 9.6 g/dL — ABNORMAL LOW (ref 12.0–15.0)
MCH: 35.7 pg — ABNORMAL HIGH (ref 26.0–34.0)
MCHC: 31.9 g/dL (ref 30.0–36.0)
MCV: 111.9 fL — ABNORMAL HIGH (ref 80.0–100.0)
Platelets: 94 10*3/uL — ABNORMAL LOW (ref 150–400)
RBC: 2.69 MIL/uL — ABNORMAL LOW (ref 3.87–5.11)
RDW: 17.2 % — ABNORMAL HIGH (ref 11.5–15.5)
WBC: 8.2 10*3/uL (ref 4.0–10.5)
nRBC: 0 % (ref 0.0–0.2)

## 2021-07-20 LAB — COMPREHENSIVE METABOLIC PANEL
ALT: 15 U/L (ref 0–44)
AST: 25 U/L (ref 15–41)
Albumin: 3.4 g/dL — ABNORMAL LOW (ref 3.5–5.0)
Alkaline Phosphatase: 42 U/L (ref 38–126)
Anion gap: 7 (ref 5–15)
BUN: 19 mg/dL (ref 8–23)
CO2: 26 mmol/L (ref 22–32)
Calcium: 9.1 mg/dL (ref 8.9–10.3)
Chloride: 101 mmol/L (ref 98–111)
Creatinine, Ser: 1.1 mg/dL — ABNORMAL HIGH (ref 0.44–1.00)
GFR, Estimated: 53 mL/min — ABNORMAL LOW (ref >60)
Glucose, Bld: 93 mg/dL (ref 70–99)
Potassium: 4.1 mmol/L (ref 3.5–5.1)
Sodium: 134 mmol/L — ABNORMAL LOW (ref 135–145)
Total Bilirubin: 0.9 mg/dL (ref 0.3–1.2)
Total Protein: 6 g/dL — ABNORMAL LOW (ref 6.5–8.1)

## 2021-07-20 LAB — PROTIME-INR
INR: 1.1 (ref 0.8–1.2)
Prothrombin Time: 13.9 seconds (ref 11.4–15.2)

## 2021-07-20 LAB — TROPONIN I (HIGH SENSITIVITY): Troponin I (High Sensitivity): 12 ng/L (ref <18)

## 2021-07-20 MED ORDER — SODIUM CHLORIDE 0.9 % IV SOLN: INTRAVENOUS | Status: DC

## 2021-07-20 MED ORDER — FENTANYL CITRATE (PF) 100 MCG/2ML IJ SOLN
INTRAMUSCULAR | Status: AC
  Filled 2021-07-20: qty 2

## 2021-07-20 MED ORDER — LIDOCAINE-EPINEPHRINE 1 %-1:100000 IJ SOLN
INTRAMUSCULAR | Status: AC
  Filled 2021-07-20: qty 1

## 2021-07-20 MED ORDER — MIDAZOLAM HCL 2 MG/2ML IJ SOLN
INTRAMUSCULAR | Status: AC
  Filled 2021-07-20: qty 4

## 2021-07-20 NOTE — ED Notes (Signed)
Patient transported to X-ray 

## 2021-07-20 NOTE — Progress Notes (Signed)
   07/20/21 1105  Vital Signs  Pulse Rate (!) 165  BP (!) 153/101  MAP (mmHg) 110   Patient in CT department for scheduled biopsy of presacral mass. Patient assisted to the prone position. Patient heart rate 130-180s (a-fib). Patient states she feels fine and comfortable. Denies dizziness, chest pain, or shortness of breath. Patient states she has never been told that her heart rate was irregular or been diagnosed with afib. Dr. Pascal Lux informed of patient heart rate/rhythm. Patient taken back to radiology nurses station. 12 lead EKG ordered/obtained (Afib RVR). Decision made by Dr. Pascal Lux to cancel biopsy scheduled for today and to send patient to emergency department for further evaluation. Patient informed of plan by Dr. Pascal Lux and she states understanding. Patient transported to triage area as directed by ED charge nurse Magda Paganini and report given to [facility], RN. Patient placed on ED monitor. All of patient belongings (clothing, phone and cane) transported with patient to ER. Patient spouse Mayte Diers informed that biopsy could not be completed today because of elevated heart rate and wife is in the emergency department for further evaluation. Husband states understanding and on the way to hospital.

## 2021-07-20 NOTE — ED Notes (Signed)
Pt stated she wanted to leave. Pt warned of risk of leaving, pt still wanting to leave, says she feels fine. Pt stated she would follow up with her primary care and educated on how to create MyChart for results.

## 2021-07-20 NOTE — ED Provider Notes (Signed)
Emergency Medicine Provider Triage Evaluation Note  [name] , a 74 y.o. female  was evaluated in triage.  Pt complains of presents from IR for a biopsy of the lumbar spine, patient went supine she converting to A. fib with RVR, she then spontaneously converted back to sinus rhythm, she has no history of A. fib, she denies any chest pain, shortness of breath, denies heart palpitations.  She does note that she had slight left arm pain but this has since resolved.  She is no other complaints this time.  She is on anticoagulant.  She has not seen a cardiologist..  Review of Systems  Positive: A. fib, left arm pain Negative: Chest pain, shortness of breath  Physical Exam  BP (!) 155/70   Pulse 86   Temp 98.4 F (36.9 C) (Oral)   Resp 16   SpO2 99%  Gen:   Awake, no distress   Resp:  Normal effort  MSK:   Moves extremities without difficulty  Other:    Medical Decision Making  Medically screening exam initiated at 12:41 PM.  Appropriate orders placed.  [name] was informed that the remainder of the evaluation will be completed by another provider, this initial triage assessment does not replace that evaluation, and the importance of remaining in the ED until their evaluation is complete.  Presents with A. fib RVR which is since resolved patient will need further work-up in the emergency department.   Marcello Fennel, PA-C 07/20/21 1242    Elnora Morrison, MD 07/21/21 [phone]

## 2021-07-20 NOTE — H&P (Signed)
Chief Complaint: Pre-Sacral mass  Referring Physician(s): Frederik Pear  Supervising Physician: Sandi Mariscal  Patient Status: [facility] - Out-pt  History of Present Illness: [name] is a 75 y.o. female who is here today for image guided biopsy of a pre-sacral mass.  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 Other possibilities include presacral teratoma (very uncommon in older patients); extramedullary hematopoiesis (unlikely based on the lesion morphology), and liposarcoma (unlikely in this case given the appearance of encapsulation). Typically, presacral myelolipomas which are causing symptoms or are over 10 cm in size are treated surgically; others can be treated with conservative management although typically fine-needle aspiration for confirmation of typical pathologic findings is indicated.  She is NPO. No nausea/vomiting. No Fever/chills. ROS negative.   Past Medical History:  Diagnosis Date   Arthritis    Rheumatoid arthritis   Celiac disease    Chronic kidney disease    stage 3 from MD notes   COPD (chronic obstructive pulmonary disease) ([place])    Dyspnea    with going up stairs   Family history of adverse reaction to anesthesia    father had hard time waking up   Headache    sinus headaches   Hot flashes    Hypertension    Iron deficiency anemia    Pneumonia    per patient "I have walking pneumonia"    Past Surgical History:  Procedure Laterality Date   COLONOSCOPY     ECTOPIC PREGNANCY SURGERY      x 2   REVERSE SHOULDER ARTHROPLASTY Right 02/02/2017   Procedure: RIGHT REVERSE SHOULDER ARTHROPLASTY;  Surgeon: Netta Cedars, MD;  Location: [facility];  Service: Orthopedics;  Laterality: Right;    Allergies: Gluten  meal, Penicillins, Alendronate, and Hydroxychloroquine  Medications: Prior to Admission medications   Medication Sig Start Date End Date Taking? Authorizing Provider  acetaminophen (TYLENOL) 500 MG tablet Take 1,000 mg by mouth every 6 (six) hours as needed for mild pain.   Yes [provider]  Calcium-Magnesium-Vitamin D (CALCIUM 1200+D3 PO) Take 1 tablet by mouth daily.   Yes [provider]  gabapentin (NEURONTIN) 300 MG capsule Take 300 mg by mouth 3 (three) times daily.  07/21/14  Yes [provider]  losartan (COZAAR) 50 MG tablet Take 50 mg by mouth daily.  05/20/19  Yes [provider]  metoprolol succinate (TOPROL-XL) 50 MG 24 hr tablet Take 50 mg by mouth every morning.  07/21/14  Yes [provider]  Phenylephrine-Acetaminophen 5-325 MG TABS Take 1 tablet by mouth daily as needed (sinus headaches).   Yes [provider]  predniSONE (DELTASONE) 5 MG tablet Take 10 mg by mouth daily.   Yes [provider]  Tiotropium Bromide-Olodaterol (STIOLTO RESPIMAT) 2.5-2.5 MCG/ACT AERS Inhale 2 puffs into the lungs daily. 05/03/21  Yes Martyn Ehrich, NP  vitamin C (ASCORBIC ACID) 500 MG tablet Take 500 mg by mouth daily.   Yes [provider]  denosumab (PROLIA) 60 MG/ML SOSY injection Inject 60 mg into the skin every 6 (six) months.    [provider]  InFLIXimab (REMICADE IV) Inject 10 mg into the vein every 8 (eight) weeks.    [provider]     Family History  Problem Relation Age of Onset   Peripheral  Artery Disease Father    Diabetes Father     Social History   Socioeconomic History   Marital status: Married    Spouse name: Not on file   Number of children: Not on file   Years of education: Not on file   Highest education level: Not on file  Occupational History   Not on file  Tobacco Use   Smoking status: Former    Packs/day: 1.00    Years: 30.00    Pack years: 30.00    Types:  Cigarettes    Quit date: 2005    Years since quitting: 17.5   Smokeless tobacco: Never  Vaping Use   Vaping Use: Never used  Substance and Sexual Activity   Alcohol use: Yes    Alcohol/week: 12.0 - 14.0 standard drinks    Types: 12 - 14 Glasses of wine per week   Drug use: No   Sexual activity: Not on file  Other Topics Concern   Not on file  Social History Narrative   Not on file   Social Determinants of Health   Financial Resource Strain: Not on file  Food Insecurity: Not on file  Transportation Needs: Not on file  Physical Activity: Not on file  Stress: Not on file  Social Connections: Not on file     Review of Systems: A 12 point ROS discussed and pertinent positives are indicated in the HPI above.  All other systems are negative.  Review of Systems  Vital Signs: BP (!) 162/85 (BP Location: Right Arm)   Pulse 79   Temp 98.3 F (36.8 C) (Oral)   Resp 18   Ht 5' 1"  (1.549 m)   Wt 58.1 kg   SpO2 99%   BMI 24.19 kg/m   Physical Exam Vitals reviewed.  Constitutional:      Appearance: Normal appearance.  HENT:     Head: Normocephalic and atraumatic.  Eyes:     Extraocular Movements: Extraocular movements intact.  Cardiovascular:     Rate and Rhythm: Normal rate and regular rhythm.  Pulmonary:     Effort: Pulmonary effort is normal. No respiratory distress.     Breath sounds: Normal breath sounds.  Abdominal:     General: There is no distension.     Palpations: Abdomen is soft.     Tenderness: There is no abdominal tenderness.  Musculoskeletal:        General: Normal range of motion.  Skin:    General: Skin is warm and dry.  Neurological:     General: No focal deficit present.     Mental Status: She is alert and oriented to person, place, and time.  Psychiatric:        Mood and Affect: Mood normal.        Behavior: Behavior normal.        Thought Content: Thought content normal.        Judgment: Judgment normal.    Imaging: US ARTERIAL SEG  MULTIPLE LE (ABI, SEGMENTAL PRESSURES, PVR'S)  Result Date: 06/21/2021 CLINICAL DATA:  75 year old female with a history of hip pain and difficulty walking EXAM: NONINVASIVE PHYSIOLOGIC VASCULAR STUDY OF BILATERAL LOWER EXTREMITIES TECHNIQUE: Evaluation of both lower extremities was performed at rest, including calculation of ankle-brachial indices, multiple segmental pressure evaluation, segmental Doppler and segmental pulse volume recording. COMPARISON:  None. FINDINGS: Right: Resting ankle brachial index:  1.33 Segmental blood pressure: Upper extremity pressures are symmetric. Appropriate increased the thigh. No significant drop between segments Doppler: Segmental  Doppler demonstrates triphasic waveforms throughout Pulse volume recording: Segmental PVR maintained Left: Resting ankle brachial index: 1.31 Segmental blood pressure: Upper extremity pressures are symmetric. Appropriate increased the thigh. No significant drop between segments Doppler: Segmental Doppler demonstrates triphasic waveforms throughout Pulse volume recording: Segmental PVR maintained throughout Additional: IMPRESSION: Resting ABI the bilateral lower extremities within normal limits with the segmental exam demonstrating no significant arterial occlusive disease Signed, Dulcy Fanny. Dellia Nims, RPVI [facility] Electronically Signed   By: Corrie Mckusick D.O.   On: 06/21/2021 14:41    MRI: CLINICAL DATA: Low back pain with left hip and leg pain. Presacral mass on recent lumbar MRI.  EXAM: MRI PELVIS WITHOUT AND WITH CONTRAST  TECHNIQUE: Multiplanar multisequence MR imaging of the pelvis was performed both before and after administration of intravenous contrast.  CONTRAST: 12 cc MultiHance  COMPARISON: CT scan of 04/02/2015 and lumbar MRI from 06/28/2021  FINDINGS: Urinary Tract: Unremarkable  Bowel: Sigmoid colon diverticulosis.  Vascular/Lymphatic:  Unremarkable  Reproductive: Unremarkable  Other: Bilobed presacral encapsulated mass with peripheral macroscopic fatty elements and central nodular soft tissue density enhancing elements. In total the mass measures 7.3 by 4.3 by 6.7 cm (volume = 110 cm^3) which is increased from 04/02/2015 where the confluent nodularity measured 5.5 by 2.2 by 3.9 cm (volume = 25 cm^3). No adjacent bony erosion identified.  Musculoskeletal: Lower lumbar spondylosis and degenerative disc disease as shown on recent lumbar MRI.  IMPRESSION: 1. 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 Other possibilities include presacral teratoma (very uncommon in older patients); extramedullary hematopoiesis (unlikely based on the lesion morphology), and liposarcoma (unlikely in this case given the appearance of encapsulation). Typically, presacral myelolipomas which are causing symptoms or are over 10 cm in size are treated surgically; others can be treated with conservative management although typically fine-needle aspiration for confirmation of typical pathologic findings is indicated. 2. Lower lumbar spondylosis and degenerative disc disease. 3. Sigmoid colon diverticulosis.   Electronically Signed By: Van Clines M.D. On: 07/05/2021 12:20 Labs:  CBC: Recent Labs    01/20/21 1249  WBC 7.8  HGB 10.0*  HCT 30.5*  PLT 110*    COAGS: No results for input(s): INR, APTT in the last 8760 hours.  BMP: Recent Labs    01/20/21 1249  NA 137  K 4.5  CL 101  CO2 27  GLUCOSE 111*  BUN 20  CALCIUM 10.0  CREATININE 1.13*  GFRNONAA 51*    LIVER FUNCTION TESTS: Recent Labs    01/20/21 1249  BILITOT 0.9  AST 23  ALT 19  ALKPHOS 46  PROT 7.0  ALBUMIN 4.1    TUMOR MARKERS: No results for input(s): AFPTM, CEA,  CA199, CHROMGRNA in the last 8760 hours.  Assessment and Plan:  Pre-sacral mass  Will proceed with image guided biopsy today by Dr. Pascal Lux.  Risks and benefits of biopsy of the pre sacral mass was discussed with the patient and/or patient's family including, but not limited to bleeding, infection, damage to adjacent structures or low yield requiring additional tests.  All of the questions were answered and there is agreement to proceed.  Consent signed and in chart.   Thank you for this interesting consult.  I greatly enjoyed meeting [name] and look forward to participating in their care.  A copy of this report was sent to the  requesting provider on this date.  Electronically Signed: Murrell Redden, PA-C   07/20/2021, 10:20 AM      I spent a total of  15 Minutes  in face to face in clinical consultation, greater than 50% of which was counseling/coordinating care for biopsy of pre-sacral mass.

## 2021-07-20 NOTE — ED Triage Notes (Signed)
Pt brought over from IR where she was there for sacral mass biopsy. They stated that she was NSR and then jumped into Afib RVR. Pt denies hx of afib and not on blood thinners.  Denies CP or SOB

## 2021-07-20 NOTE — Progress Notes (Signed)
Patient ID: [name], female   DOB: 21-Sep-1946, 75 y.o.   MRN: [id number]  CT guided pre-sacral mass biopsy NOT performed secondary to patient's development of asymptomatic A-fib with RVR, not previously documented.  Patient was escorted to the ED for further evaluation and management.  Please re-order CT guided biopsy once deemed appropriate.  Ronny Bacon, MD Pager #: [phone]

## 2021-07-21 DIAGNOSIS — I4891 Unspecified atrial fibrillation: Secondary | ICD-10-CM | POA: Diagnosis not present

## 2021-08-05 DIAGNOSIS — M5416 Radiculopathy, lumbar region: Secondary | ICD-10-CM | POA: Diagnosis not present

## 2021-08-08 DIAGNOSIS — M0579 Rheumatoid arthritis with rheumatoid factor of multiple sites without organ or systems involvement: Secondary | ICD-10-CM | POA: Diagnosis not present

## 2021-08-31 ENCOUNTER — Ambulatory Visit
Admission: RE | Admit: 2021-08-31 | Discharge: 2021-08-31 | Disposition: A | Discharge status: Home / Self Care | Payer: Medicare Other | Source: Ambulatory Visit | Attending: Family Medicine | Admitting: Family Medicine

## 2021-08-31 ENCOUNTER — Other Ambulatory Visit: Payer: Self-pay | Admitting: Family Medicine

## 2021-08-31 DIAGNOSIS — J449 Chronic obstructive pulmonary disease, unspecified: Secondary | ICD-10-CM | POA: Diagnosis not present

## 2021-08-31 DIAGNOSIS — R059 Cough, unspecified: Secondary | ICD-10-CM

## 2021-08-31 DIAGNOSIS — J439 Emphysema, unspecified: Secondary | ICD-10-CM | POA: Diagnosis not present

## 2021-08-31 DIAGNOSIS — R918 Other nonspecific abnormal finding of lung field: Secondary | ICD-10-CM | POA: Diagnosis not present

## 2021-09-06 ENCOUNTER — Ambulatory Visit
Admission: RE | Admit: 2021-09-06 | Discharge: 2021-09-06 | Disposition: A | Discharge status: Home / Self Care | Payer: Medicare Other | Source: Ambulatory Visit | Attending: Family Medicine | Admitting: Family Medicine

## 2021-09-06 ENCOUNTER — Other Ambulatory Visit: Payer: Self-pay | Admitting: Family Medicine

## 2021-09-06 DIAGNOSIS — M25552 Pain in left hip: Secondary | ICD-10-CM | POA: Diagnosis not present

## 2021-09-06 DIAGNOSIS — M545 Low back pain, unspecified: Secondary | ICD-10-CM | POA: Diagnosis not present

## 2021-09-06 DIAGNOSIS — M25562 Pain in left knee: Secondary | ICD-10-CM | POA: Diagnosis not present

## 2021-09-07 DIAGNOSIS — R921 Mammographic calcification found on diagnostic imaging of breast: Secondary | ICD-10-CM | POA: Diagnosis not present

## 2021-09-12 DIAGNOSIS — M1712 Unilateral primary osteoarthritis, left knee: Secondary | ICD-10-CM | POA: Diagnosis not present

## 2021-09-22 DIAGNOSIS — M179 Osteoarthritis of knee, unspecified: Secondary | ICD-10-CM | POA: Diagnosis not present

## 2021-09-22 DIAGNOSIS — M5416 Radiculopathy, lumbar region: Secondary | ICD-10-CM | POA: Diagnosis not present

## 2021-09-22 DIAGNOSIS — M069 Rheumatoid arthritis, unspecified: Secondary | ICD-10-CM | POA: Diagnosis not present

## 2021-09-22 DIAGNOSIS — M353 Polymyalgia rheumatica: Secondary | ICD-10-CM | POA: Diagnosis not present

## 2021-09-22 DIAGNOSIS — M549 Dorsalgia, unspecified: Secondary | ICD-10-CM | POA: Diagnosis not present

## 2021-10-03 DIAGNOSIS — Z79899 Other long term (current) drug therapy: Secondary | ICD-10-CM | POA: Diagnosis not present

## 2021-10-03 DIAGNOSIS — M0579 Rheumatoid arthritis with rheumatoid factor of multiple sites without organ or systems involvement: Secondary | ICD-10-CM | POA: Diagnosis not present

## 2021-10-03 DIAGNOSIS — M5416 Radiculopathy, lumbar region: Secondary | ICD-10-CM | POA: Diagnosis not present

## 2021-10-04 ENCOUNTER — Other Ambulatory Visit: Payer: Self-pay

## 2021-10-04 ENCOUNTER — Ambulatory Visit (INDEPENDENT_AMBULATORY_CARE_PROVIDER_SITE_OTHER): Payer: Medicare Other | Admitting: Cardiovascular Disease

## 2021-10-04 ENCOUNTER — Encounter: Payer: Self-pay | Admitting: Cardiovascular Disease

## 2021-10-04 VITALS — BP 140/68 | HR 84 | Ht 61.0 in | Wt 130.0 lb

## 2021-10-04 DIAGNOSIS — I4891 Unspecified atrial fibrillation: Secondary | ICD-10-CM | POA: Diagnosis not present

## 2021-10-04 DIAGNOSIS — I480 Paroxysmal atrial fibrillation: Secondary | ICD-10-CM | POA: Diagnosis not present

## 2021-10-04 MED ORDER — APIXABAN 5 MG PO TABS
5.0000 mg | ORAL_TABLET | Freq: Two times a day (BID) | ORAL | 11 refills | Status: DC
Start: 2021-10-04 — End: 2022-03-27

## 2021-10-04 NOTE — Progress Notes (Signed)
Cardiology Office Note:    Date:  10/04/2021   ID:  [name], DOB [birth year], MRN [id number]  PCP:  [name], MD   [facility] Providers Cardiologist:  None     Referring MD: [name], MD   Chief Complaint  Patient presents with   Atrial Fibrillation    Oct. 11, 2022   [name] is a 75 y.o. female with a hx of back pain,  CKD, HLD, COPD, paroxysmal A. fib.  We are asked to see her today by Dr. Laurann Montana for further evaluation and management of her paroxysmal atrial fibrillation.  She has not yet been started on anticoagulation. CHADS2VASC is 7 ( female, age, HTN)   No hx of CVA, CHF, DM , no vascular disease    Asymptomatic  Cannot tell when her HR is irreg.  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 Laurann Montana indicate a previous GI bleed and colonoscopy with Dr Collene Mares - she does not recall this .   Past Medical History:  Diagnosis Date   Arthritis    Rheumatoid arthritis   Celiac disease    Chronic kidney disease    stage 3 from MD notes   COPD (chronic obstructive pulmonary disease) ([place])    Dyspnea    with going up stairs   Family history of adverse reaction to anesthesia    father had hard time waking up   Headache    sinus headaches   Hot flashes    Hypertension    Iron deficiency anemia    Pneumonia    per patient "I have walking pneumonia"    Past Surgical History:  Procedure Laterality Date   COLONOSCOPY     ECTOPIC PREGNANCY SURGERY      x 2   REVERSE SHOULDER ARTHROPLASTY Right 02/02/2017   Procedure: RIGHT REVERSE SHOULDER ARTHROPLASTY;  Surgeon: Netta Cedars, MD;  Location: [facility];  Service: Orthopedics;  Laterality: Right;    Current Medications: Current Meds  Medication Sig   acetaminophen (TYLENOL) 500 MG tablet Take 1,000 mg by  mouth every 6 (six) hours as needed for mild pain.   apixaban (ELIQUIS) 5 MG TABS tablet Take 1 tablet (5 mg total) by mouth 2 (two) times daily.   Calcium-Magnesium-Vitamin D (CALCIUM 1200+D3 PO) Take 1 tablet by mouth daily.   denosumab (PROLIA) 60 MG/ML SOSY injection Inject 60 mg into the skin every 6 (six) months.   gabapentin (NEURONTIN) 300 MG capsule Take 300 mg by mouth 3 (three) times daily.    InFLIXimab (REMICADE IV) Inject 10 mg into the vein every 8 (eight) weeks.   losartan (COZAAR) 50 MG tablet Take 50 mg by mouth daily.    metoprolol succinate (TOPROL-XL) 50 MG 24 hr tablet Take 50 mg by mouth every morning.    Phenylephrine-Acetaminophen 5-325 MG TABS Take 1 tablet by mouth daily as needed (sinus headaches).   predniSONE (DELTASONE) 5 MG tablet Take 10 mg by mouth daily.   Tiotropium Bromide-Olodaterol (STIOLTO RESPIMAT) 2.5-2.5 MCG/ACT AERS Inhale 2 puffs into the lungs daily.   vitamin C (ASCORBIC ACID) 500 MG tablet Take 500 mg by mouth daily.     Allergies:   Gluten meal, Penicillins, Alendronate, and Hydroxychloroquine   Social  History   Socioeconomic History   Marital status: Married    Spouse name: Not on file   Number of children: Not on file   Years of education: Not on file   Highest education level: Not on file  Occupational History   Not on file  Tobacco Use   Smoking status: Former    Packs/day: 1.00    Years: 30.00    Pack years: 30.00    Types: Cigarettes    Quit date: 2005    Years since quitting: 17.7   Smokeless tobacco: Never  Vaping Use   Vaping Use: Never used  Substance and Sexual Activity   Alcohol use: Yes    Alcohol/week: 12.0 - 14.0 standard drinks    Types: 12 - 14 Glasses of wine per week   Drug use: No   Sexual activity: Not on file  Other Topics Concern   Not on file  Social History Narrative   Not on file   Social Determinants of Health   Financial Resource Strain: Not on file  Food Insecurity: Not on file   Transportation Needs: Not on file  Physical Activity: Not on file  Stress: Not on file  Social Connections: Not on file     Family History: The patient's family history includes Diabetes in her father; Peripheral Artery Disease in her father.  ROS:   Please see the history of present illness.     All other systems reviewed and are negative.  EKGs/Labs/Other Studies Reviewed:    The following studies were reviewed today: - previous ecgs   EKG: EKG from July 20, 2021 reveals atrial fibrillation with a heart rate of 128.  EKG later that day revealed normal sinus rhythm at 83. EKG on October 04, 2021 in our office today reveals normal sinus rhythm with a rate of 84.  No ST or T wave changes.  Recent Labs: 07/20/2021: ALT 15; BUN 19; Creatinine, Ser 1.10; Hemoglobin 9.3; Platelets 81; Potassium 4.1; Sodium 134  Recent Lipid Panel No results found for: CHOL, TRIG, HDL, CHOLHDL, VLDL, LDLCALC, LDLDIRECT   Risk Assessment/Calculations:    CHA2DS2-VASc Score = 3  } This indicates a 3.2% annual risk of stroke. The patient's score is based upon: CHF History: 0 HTN History: 1 Diabetes History: 0 Stroke History: 0 Vascular Disease History: 0 Age Score: 1 Gender Score: 1         Physical Exam:    VS:  BP 140/68   Pulse 84   Ht 5' 1"  (1.549 m)   Wt 130 lb (59 kg)   SpO2 99%   BMI 24.56 kg/m     Wt Readings from Last 3 Encounters:  10/04/21 130 lb (59 kg)  07/20/21 128 lb (58.1 kg)  05/03/21 133 lb (60.3 kg)     GEN: Elderly, frail female, no acute distress well developed in no acute distress HEENT: Normal NECK: No JVD; No carotid bruits LYMPHATICS: No lymphadenopathy CARDIAC: RRR,  soft systolic murmur ratidating to the axilla  RESPIRATORY:  Clear to auscultation without rales, wheezing or rhonchi  ABDOMEN: Soft, non-tender, non-distended MUSCULOSKELETAL:  No edema; No deformity  SKIN: Warm and dry NEUROLOGIC:  Alert and oriented x 3 PSYCHIATRIC:  Normal affect    ASSESSMENT:    1. Atrial fibrillation, unspecified type (HCC)   2. Paroxysmal atrial fibrillation (HCC)    PLAN:      Atrial fib:   has PAF , she had a documented episode of atrial fibrillation when she  went into the hospital for of spinal biopsy in July.  She apparently only had atrial fibrillation for a brief time because the follow-up EKG later that day was in sinus rhythm. I recommended that we go ahead and start Eliquis 5 mg twice a day since she still is at risk for having a stroke.  She is concerned about the blood thinner given the fact that she only has atrial fibrillation intermittently and for a brief duration.  I explained that the risk of stroke persist even with paroxysmal atrial fibrillation and that I would recommend that she take Eliquis.  She will need to watch for any signs of bleeding.  We will get an echocardiogram for further assessment of her LV function and chamber size.  She also has a soft mitral valve regurgitation murmur which we will evaluate further with echo.  Heart rate is currently well controlled.  We will have her follow-up with an APP in 3 months for follow-up visit.  She will be going to see Dr. Laurann Montana for primary care visit soon.        Medication Adjustments/Labs and Tests Ordered: Current medicines are reviewed at length with the patient today.  Concerns regarding medicines are outlined above.  Orders Placed This Encounter  Procedures   EKG 12-Lead   ECHOCARDIOGRAM COMPLETE    Meds ordered this encounter  Medications   apixaban (ELIQUIS) 5 MG TABS tablet    Sig: Take 1 tablet (5 mg total) by mouth 2 (two) times daily.    Dispense:  60 tablet    Refill:  11     Patient Instructions  Medication Instructions:  1) START Eliquis 63m twice daily  *If you need a refill on your cardiac medications before your next appointment, please call your pharmacy*   Lab Work: None If you have labs (blood work) drawn today and your tests are  completely normal, you will receive your results only by: [place](if you have MyChart) OR A paper copy in the mail If you have any lab test that is abnormal or we need to change your treatment, we will call you to review the results.   Testing/Procedures: Your physician has requested that you have an echocardiogram. Echocardiography is a painless test that uses sound waves to create images of your heart. It provides your doctor with information about the size and shape of your heart and how well your heart's chambers and valves are working. This procedure takes approximately one hour. There are no restrictions for this procedure.   Follow-Up: [facility] our [facility].  As part of our continuing mission to provide you with exceptional heart care, we have created designated Provider Care Teams.  These Care Teams include your primary Cardiologist (physician) and Advanced Practice Providers (APPs -  Physician Assistants and Nurse Practitioners) who all work together to provide you with the care you need, when you need it.  We recommend signing up for the patient portal called "MyChart".  Sign up information is provided on this After Visit Summary.  MyChart is used to connect with patients for Virtual Visits (Telemedicine).  Patients are able to view lab/test results, encounter notes, upcoming appointments, etc.  Non-urgent messages can be sent to your provider as well.   To learn more about what you can do with MyChart, go to [email or web address]    Your next appointment:   3 month(s)  The format for your next appointment:   In Person  Provider:   You will see one of the following Advanced Practice Providers on your designated Care Team:   Richardson Dopp, PA-C Robbie Lis, [place]   Other Instructions     Signed, Mertie Moores, MD  10/04/2021 11:10 AM    [place]

## 2021-10-04 NOTE — Patient Instructions (Signed)
Medication Instructions:  1) START Eliquis 85m twice daily  *If you need a refill on your cardiac medications before your next appointment, please call your pharmacy*   Lab Work: None If you have labs (blood work) drawn today and your tests are completely normal, you will receive your results only by: [place](if you have MyChart) OR A paper copy in the mail If you have any lab test that is abnormal or we need to change your treatment, we will call you to review the results.   Testing/Procedures: Your physician has requested that you have an echocardiogram. Echocardiography is a painless test that uses sound waves to create images of your heart. It provides your doctor with information about the size and shape of your heart and how well your heart's chambers and valves are working. This procedure takes approximately one hour. There are no restrictions for this procedure.   Follow-Up: [facility] you and your health needs are our priority.  As part of our continuing mission to provide you with exceptional heart care, we have created designated Provider Care Teams.  These Care Teams include your primary Cardiologist (physician) and Advanced Practice Providers (APPs -  Physician Assistants and Nurse Practitioners) who all work together to provide you with the care you need, when you need it.  We recommend signing up for the patient portal called "MyChart".  Sign up information is provided on this After Visit Summary.  MyChart is used to connect with patients for Virtual Visits (Telemedicine).  Patients are able to view lab/test results, encounter notes, upcoming appointments, etc.  Non-urgent messages can be sent to your provider as well.   To learn more about what you can do with MyChart, go to [email or web address]    Your next appointment:   3 month(s)  The format for your next appointment:   In Person  Provider:   You will see one of the following Advanced Practice  Providers on your designated Care Team:   SRichardson Dopp PA-C VRobbie Lis [place]  Other Instructions

## 2021-10-10 DIAGNOSIS — K900 Celiac disease: Secondary | ICD-10-CM | POA: Diagnosis not present

## 2021-10-10 DIAGNOSIS — D509 Iron deficiency anemia, unspecified: Secondary | ICD-10-CM | POA: Diagnosis not present

## 2021-10-12 DIAGNOSIS — D509 Iron deficiency anemia, unspecified: Secondary | ICD-10-CM | POA: Diagnosis not present

## 2021-10-18 DIAGNOSIS — D638 Anemia in other chronic diseases classified elsewhere: Secondary | ICD-10-CM | POA: Diagnosis not present

## 2021-10-18 DIAGNOSIS — M810 Age-related osteoporosis without current pathological fracture: Secondary | ICD-10-CM | POA: Diagnosis not present

## 2021-10-18 DIAGNOSIS — M069 Rheumatoid arthritis, unspecified: Secondary | ICD-10-CM | POA: Diagnosis not present

## 2021-10-18 DIAGNOSIS — Z23 Encounter for immunization: Secondary | ICD-10-CM | POA: Diagnosis not present

## 2021-10-18 DIAGNOSIS — J449 Chronic obstructive pulmonary disease, unspecified: Secondary | ICD-10-CM | POA: Diagnosis not present

## 2021-10-18 DIAGNOSIS — Z1389 Encounter for screening for other disorder: Secondary | ICD-10-CM | POA: Diagnosis not present

## 2021-10-18 DIAGNOSIS — D693 Immune thrombocytopenic purpura: Secondary | ICD-10-CM | POA: Diagnosis not present

## 2021-10-18 DIAGNOSIS — E7800 Pure hypercholesterolemia, unspecified: Secondary | ICD-10-CM | POA: Diagnosis not present

## 2021-10-18 DIAGNOSIS — Z0000 Encounter for general adult medical examination without abnormal findings: Secondary | ICD-10-CM | POA: Diagnosis not present

## 2021-10-18 DIAGNOSIS — N1831 Chronic kidney disease, stage 3a: Secondary | ICD-10-CM | POA: Diagnosis not present

## 2021-10-18 DIAGNOSIS — I129 Hypertensive chronic kidney disease with stage 1 through stage 4 chronic kidney disease, or unspecified chronic kidney disease: Secondary | ICD-10-CM | POA: Diagnosis not present

## 2021-10-18 DIAGNOSIS — D509 Iron deficiency anemia, unspecified: Secondary | ICD-10-CM | POA: Diagnosis not present

## 2021-10-20 ENCOUNTER — Other Ambulatory Visit (HOSPITAL_COMMUNITY): Payer: Medicare Other

## 2021-10-20 DIAGNOSIS — M1712 Unilateral primary osteoarthritis, left knee: Secondary | ICD-10-CM | POA: Diagnosis not present

## 2021-10-25 ENCOUNTER — Encounter (HOSPITAL_COMMUNITY): Payer: Self-pay | Admitting: Radiology

## 2021-10-25 NOTE — Progress Notes (Signed)
Patient Name  [name], [name] Legal Sex  Female DOB  04-Dec-1946 SSN  [id number] Address  [address] [place] [address] Phone  [phone] (Home) *Preferred*  [phone] (Mobile)    RE: Eliquis, CT Biopsy Received: Today [name], Wonda Cheng, MD  Garth Bigness D Ms. Treanor may hold her Eliquis for 2 days prior to CT guided biopsy.   Restart as soon as possible following the biopsy.   PN        Previous Messages   ----- Message -----  From: Garth Bigness D  Sent: 10/25/2021  10:59 AM EDT  To: Thayer Headings, MD  Subject: Eliquis, CT Biopsy                             Dr. Acie Fredrickson, patient has an order in epic for a CT Biopsy that another office wants me to reschedule her. I noticed that  patient was just placed on Eliquis under you care. Patient will need to hold for 2 days prior to her biopsy. Please advise if okay to hold or not since patient has only been on it for almost a month. I understand if she needs to be on her Eliquis longer before stopping for 2 days.    Thanks Aniceto Boss

## 2021-10-27 ENCOUNTER — Ambulatory Visit (HOSPITAL_COMMUNITY): Payer: Medicare Other | Attending: Cardiovascular Disease

## 2021-10-27 ENCOUNTER — Other Ambulatory Visit: Payer: Self-pay

## 2021-10-27 DIAGNOSIS — I4891 Unspecified atrial fibrillation: Secondary | ICD-10-CM | POA: Insufficient documentation

## 2021-10-27 LAB — ECHOCARDIOGRAM COMPLETE
Area-P 1/2: 6.24 cm2
S' Lateral: 1.9 cm

## 2021-10-28 ENCOUNTER — Telehealth: Payer: Self-pay | Admitting: Cardiovascular Disease

## 2021-10-28 NOTE — Telephone Encounter (Signed)
Thayer Headings, MD  10/28/2021 11:56 AM EDT     Normal LV function  Mild MR     The patient has been notified of the result and verbalized understanding.  All questions (if any) were answered. Nuala Alpha, LPN [id number] 3:22 PM

## 2021-10-28 NOTE — Telephone Encounter (Signed)
Patient returning call for echo results. She says to call her home number: [phone]. She says she has trouble getting to the phone in time and that it is okay to leave a detailed voicemail.

## 2021-11-08 ENCOUNTER — Other Ambulatory Visit (HOSPITAL_COMMUNITY): Payer: Self-pay | Admitting: Physician Assistant

## 2021-11-08 DIAGNOSIS — Z23 Encounter for immunization: Secondary | ICD-10-CM | POA: Diagnosis not present

## 2021-11-09 ENCOUNTER — Other Ambulatory Visit: Payer: Self-pay | Admitting: Radiology

## 2021-11-09 ENCOUNTER — Ambulatory Visit (HOSPITAL_COMMUNITY)
Admission: RE | Admit: 2021-11-09 | Discharge: 2021-11-09 | Disposition: A | Discharge status: Home / Self Care | Payer: Medicare Other | Source: Ambulatory Visit | Attending: Orthopedic Surgery | Admitting: Orthopedic Surgery

## 2021-11-09 ENCOUNTER — Other Ambulatory Visit: Payer: Self-pay

## 2021-11-09 ENCOUNTER — Encounter (HOSPITAL_COMMUNITY): Payer: Self-pay

## 2021-11-09 DIAGNOSIS — I4891 Unspecified atrial fibrillation: Secondary | ICD-10-CM | POA: Diagnosis not present

## 2021-11-09 DIAGNOSIS — R1900 Intra-abdominal and pelvic swelling, mass and lump, unspecified site: Secondary | ICD-10-CM | POA: Diagnosis not present

## 2021-11-09 DIAGNOSIS — D704 Cyclic neutropenia: Secondary | ICD-10-CM | POA: Diagnosis not present

## 2021-11-09 DIAGNOSIS — C9110 Chronic lymphocytic leukemia of B-cell type not having achieved remission: Secondary | ICD-10-CM | POA: Diagnosis not present

## 2021-11-09 DIAGNOSIS — Z7901 Long term (current) use of anticoagulants: Secondary | ICD-10-CM | POA: Diagnosis not present

## 2021-11-09 DIAGNOSIS — C8590 Non-Hodgkin lymphoma, unspecified, unspecified site: Secondary | ICD-10-CM | POA: Insufficient documentation

## 2021-11-09 DIAGNOSIS — R1909 Other intra-abdominal and pelvic swelling, mass and lump: Secondary | ICD-10-CM | POA: Diagnosis present

## 2021-11-09 DIAGNOSIS — M549 Dorsalgia, unspecified: Secondary | ICD-10-CM | POA: Insufficient documentation

## 2021-11-09 LAB — PROTIME-INR
INR: 1.1 (ref 0.8–1.2)
Prothrombin Time: 14.3 seconds (ref 11.4–15.2)

## 2021-11-09 LAB — CBC
HCT: 28 % — ABNORMAL LOW (ref 36.0–46.0)
Hemoglobin: 8.4 g/dL — ABNORMAL LOW (ref 12.0–15.0)
MCH: 35.6 pg — ABNORMAL HIGH (ref 26.0–34.0)
MCHC: 30 g/dL (ref 30.0–36.0)
MCV: 118.6 fL — ABNORMAL HIGH (ref 80.0–100.0)
Platelets: 76 10*3/uL — ABNORMAL LOW (ref 150–400)
RBC: 2.36 MIL/uL — ABNORMAL LOW (ref 3.87–5.11)
RDW: 16.8 % — ABNORMAL HIGH (ref 11.5–15.5)
WBC: 7.1 10*3/uL (ref 4.0–10.5)
nRBC: 0 % (ref 0.0–0.2)

## 2021-11-09 MED ORDER — GELATIN ABSORBABLE 12-7 MM EX MISC
CUTANEOUS | Status: AC
  Filled 2021-11-09: qty 1

## 2021-11-09 MED ORDER — LIDOCAINE HCL 1 % IJ SOLN
INTRAMUSCULAR | Status: AC
  Filled 2021-11-09: qty 10

## 2021-11-09 MED ORDER — FENTANYL CITRATE (PF) 100 MCG/2ML IJ SOLN
INTRAMUSCULAR | Status: AC
  Filled 2021-11-09: qty 4

## 2021-11-09 MED ORDER — MIDAZOLAM HCL 2 MG/2ML IJ SOLN
INTRAMUSCULAR | Status: AC | PRN
  Administered 2021-11-09: 1 mg via INTRAVENOUS
  Administered 2021-11-09: .5 mg via INTRAVENOUS

## 2021-11-09 MED ORDER — MIDAZOLAM HCL 2 MG/2ML IJ SOLN
INTRAMUSCULAR | Status: AC
  Filled 2021-11-09: qty 4

## 2021-11-09 MED ORDER — FENTANYL CITRATE (PF) 100 MCG/2ML IJ SOLN
INTRAMUSCULAR | Status: AC | PRN
  Administered 2021-11-09 (×2): 25 ug via INTRAVENOUS
  Administered 2021-11-09: 50 ug via INTRAVENOUS

## 2021-11-09 MED ORDER — SODIUM CHLORIDE 0.9 % IV SOLN: INTRAVENOUS | Status: DC

## 2021-11-09 NOTE — H&P (Signed)
Chief Complaint: Patient was seen in consultation today for pre sacral mass biopsy at the request of [place]  Referring Physician(s): Rowan,Frank  Supervising Physician: Michaelle Birks  Patient Status: [facility] - Out-pt  History of Present Illness: [name] is a 75 y.o. female  Pt was scheduled for this procedure 07/20/21---- was noted to be in new Afib Procedure was cancelled. Pt has since seen Cardiologist and is on Eliquis daily Well controlled per pt LD Eliquis 3 days ago  Pt had seen PCP about back pain in July 2022 MRI 07/04/21:  Slowly enlarging presacral mass currently 110 cubic cm, with macroscopic peripheral fatty elements and central nodular enhancing soft tissue elements. 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 Other possibilities include presacral teratoma (very uncommon in older patients); extramedullary hematopoiesis (unlikely based on the lesion morphology), and liposarcoma (unlikely in this case given the appearance of encapsulation). Typically, presacral myelolipomas which are causing symptoms or are over 10 cm in size are treated surgically; others can be treated with conservative management although typically fine-needle aspiration for confirmation of typical pathologic findings is indicated.  Back pain is worsening  Request made for pre sacral mass biopsy Rescheduled for today    Past Medical History:  Diagnosis Date   Arthritis    Rheumatoid arthritis   Celiac disease    Chronic kidney disease    stage 3 from MD notes   COPD (chronic obstructive pulmonary disease) (HCC)    Dyspnea    with going up stairs   Family history of adverse reaction to anesthesia    father had hard time waking up   Headache    sinus headaches   Hot flashes    Hypertension    Iron deficiency anemia    Pneumonia    per patient "I have walking pneumonia"    Past Surgical History:  Procedure Laterality Date    COLONOSCOPY     ECTOPIC PREGNANCY SURGERY      x 2   REVERSE SHOULDER ARTHROPLASTY Right 02/02/2017   Procedure: RIGHT REVERSE SHOULDER ARTHROPLASTY;  Surgeon: Netta Cedars, MD;  Location: [place];  Service: Orthopedics;  Laterality: Right;    Allergies: Gluten meal, Penicillins, Fosamax [alendronate], and Hydroxychloroquine  Medications: Prior to Admission medications   Medication Sig Start Date End Date Taking? Authorizing Provider  acetaminophen (TYLENOL) 500 MG tablet Take 1,000 mg by mouth every 6 (six) hours as needed for mild pain.   Yes [provider]  Calcium-Magnesium-Vitamin D (CALCIUM 1200+D3 PO) Take 1 tablet by mouth daily.   Yes [provider]  gabapentin (NEURONTIN) 300 MG capsule Take 300 mg by mouth 3 (three) times daily.  07/21/14  Yes [provider]  losartan (COZAAR) 50 MG tablet Take 50 mg by mouth daily.  05/20/19  Yes [provider]  metoprolol succinate (TOPROL-XL) 50 MG 24 hr tablet Take 50 mg by mouth every morning.  07/21/14  Yes [provider]  predniSONE (DELTASONE) 10 MG tablet Take 10 mg by mouth daily.   Yes [provider]  Tiotropium Bromide-Olodaterol (STIOLTO RESPIMAT) 2.5-2.5 MCG/ACT AERS Inhale 2 puffs into the lungs daily. 05/03/21  Yes Martyn Ehrich, NP  vitamin C (ASCORBIC ACID) 500 MG tablet Take 500 mg by mouth daily.   Yes [provider]  apixaban (ELIQUIS) 5 MG TABS tablet Take 1 tablet (5 mg total) by mouth 2 (two) times daily. Patient not taking: No sig reported 10/04/21   [name], Wonda Cheng, MD  denosumab (PROLIA) 60 MG/ML SOSY injection Inject 60 mg into the skin every 6 (six) months.    [provider]  InFLIXimab (REMICADE IV) Inject 10 mg into the vein every 8 (eight) weeks.    [provider]     Family History  Problem Relation Age of Onset   Peripheral Artery Disease Father    Diabetes Father     Social History   Socioeconomic History   Marital  status: Married    Spouse name: Not on file   Number of children: Not on file   Years of education: Not on file   Highest education level: Not on file  Occupational History   Not on file  Tobacco Use   Smoking status: Former    Packs/day: 1.00    Years: 30.00    Pack years: 30.00    Types: Cigarettes    Quit date: 2005    Years since quitting: 17.8   Smokeless tobacco: Never  Vaping Use   Vaping Use: Never used  Substance and Sexual Activity   Alcohol use: Yes    Alcohol/week: 12.0 - 14.0 standard drinks    Types: 12 - 14 Glasses of wine per week   Drug use: No   Sexual activity: Not on file  Other Topics Concern   Not on file  Social History Narrative   Not on file   Social Determinants of Health   Financial Resource Strain: Not on file  Food Insecurity: Not on file  Transportation Needs: Not on file  Physical Activity: Not on file  Stress: Not on file  Social Connections: Not on file     Review of Systems: A 12 point ROS discussed and pertinent positives are indicated in the HPI above.  All other systems are negative.  Review of Systems  Constitutional:  Positive for activity change. Negative for appetite change, fatigue and fever.  Respiratory:  Negative for cough and shortness of breath.   Cardiovascular:  Negative for chest pain.  Gastrointestinal:  Negative for abdominal pain.  Musculoskeletal:  Positive for back pain.  Neurological:  Negative for weakness.  Psychiatric/Behavioral:  Negative for behavioral problems and confusion.    Vital Signs: BP (!) 176/87   Pulse 87   Temp 98.4 F (36.9 C) (Oral)   Ht 5' 3"  (1.6 m)   Wt 125 lb (56.7 kg)   SpO2 100%   BMI 22.14 kg/m   Physical Exam Vitals reviewed.  HENT:     Mouth/Throat:     Mouth: Mucous membranes are moist.  Cardiovascular:     Rate and Rhythm: Normal rate and regular rhythm.  Pulmonary:     Effort: Pulmonary effort is normal.     Breath sounds: Normal breath sounds.  Abdominal:      Palpations: Abdomen is soft.  Musculoskeletal:        General: Normal range of motion.  Skin:    General: Skin is warm.  Neurological:     Mental Status: She is alert and oriented to person, place, and time.  Psychiatric:        Behavior: Behavior normal.    Imaging: ECHOCARDIOGRAM COMPLETE  Result Date: 10/27/2021    ECHOCARDIOGRAM REPORT   Patient Name:   SHALAUNDA WEATHERHOLTZ [facility] Date of Exam: 10/27/2021 Medical Rec #:  [id number]        Height:       61.0 in Accession #:    [id number]       Weight:  130.0 lb Date of Birth:  August 04, 1946       BSA:          1.573 m Patient Age:    31 years         BP:           140/68 mmHg Patient Gender: F                HR:           92 bpm. Exam Location:  [place] Procedure: 2D Echo, Cardiac Doppler and Color Doppler Indications:    I48.91* Unspecified atrial fibrillation  History:        Patient has no prior history of Echocardiogram examinations.                 COPD, Signs/Symptoms:Dyspnea; Risk Factors:Hypertension. Anemia.                 Chronic kidney disease.  Sonographer:    Diamond Nickel RCS Referring Phys: [place]  Sonographer Comments: Technically difficult to position patient due to left hip pain. IMPRESSIONS  1. Left ventricular ejection fraction, by estimation, is 60 to 65%. The left ventricle has normal function. The left ventricle has no regional wall motion abnormalities. Left ventricular diastolic parameters were normal.  2. Right ventricular systolic function is normal. The right ventricular size is normal. There is normal pulmonary artery systolic pressure.  3. Left atrial size was moderately dilated.  4. Right atrial size was mildly dilated.  5. The mitral valve is normal in structure. Mild mitral valve regurgitation. No evidence of mitral stenosis.  6. The aortic valve is tricuspid. Aortic valve regurgitation is not visualized. No aortic stenosis is present.  7. The inferior vena cava is normal in size with greater than 50%  respiratory variability, suggesting right atrial pressure of 3 mmHg. FINDINGS  Left Ventricle: Left ventricular ejection fraction, by estimation, is 60 to 65%. The left ventricle has normal function. The left ventricle has no regional wall motion abnormalities. The left ventricular internal cavity size was normal in size. There is  no left ventricular hypertrophy. Left ventricular diastolic parameters were normal. Indeterminate filling pressures. Right Ventricle: The right ventricular size is normal. No increase in right ventricular wall thickness. Right ventricular systolic function is normal. There is normal pulmonary artery systolic pressure. The tricuspid regurgitant velocity is 2.73 m/s, and  with an assumed right atrial pressure of 3 mmHg, the estimated right ventricular systolic pressure is 62.3 mmHg. Left Atrium: Left atrial size was moderately dilated. Right Atrium: Right atrial size was mildly dilated. Pericardium: There is no evidence of pericardial effusion. Mitral Valve: The mitral valve is normal in structure. Mild mitral valve regurgitation. No evidence of mitral valve stenosis. Tricuspid Valve: The tricuspid valve is normal in structure. Tricuspid valve regurgitation is trivial. No evidence of tricuspid stenosis. Aortic Valve: The aortic valve is tricuspid. Aortic valve regurgitation is not visualized. No aortic stenosis is present. Pulmonic Valve: The pulmonic valve was normal in structure. Pulmonic valve regurgitation is trivial. No evidence of pulmonic stenosis. Aorta: The aortic root is normal in size and structure. Venous: The inferior vena cava is normal in size with greater than 50% respiratory variability, suggesting right atrial pressure of 3 mmHg. IAS/Shunts: No atrial level shunt detected by color flow Doppler.  LEFT VENTRICLE PLAX 2D LVIDd:         2.80 cm   Diastology LVIDs:         1.90  cm   LV e' medial:    9.98 cm/s LV PW:         1.30 cm   LV E/e' medial:  14.8 LV IVS:        1.00 cm    LV e' lateral:   13.07 cm/s LVOT diam:     1.75 cm   LV E/e' lateral: 11.3 LV SV:         70 LV SV Index:   45 LVOT Area:     2.41 cm  RIGHT VENTRICLE RV Basal diam:  3.20 cm RV S prime:     15.20 cm/s TAPSE (M-mode): 2.1 cm RVSP:           32.8 mmHg LEFT ATRIUM             Index        RIGHT ATRIUM           Index LA diam:        3.40 cm 2.16 cm/m   RA Pressure: 3.00 mmHg LA Vol (A2C):   59.3 ml 37.70 ml/m  RA Area:     18.20 cm LA Vol (A4C):   55.9 ml 35.54 ml/m  RA Volume:   49.50 ml  31.47 ml/m LA Biplane Vol: 57.7 ml 36.69 ml/m  AORTIC VALVE LVOT Vmax:   148.67 cm/s LVOT Vmean:  90.867 cm/s LVOT VTI:    0.292 m  AORTA Ao Root diam: 2.90 cm MITRAL VALVE                TRICUSPID VALVE MV Area (PHT): 6.24 cm     TR Peak grad:   29.8 mmHg MV Decel Time: 122 msec     TR Vmax:        273.00 cm/s MV E velocity: 147.33 cm/s  Estimated RAP:  3.00 mmHg MV A velocity: 94.20 cm/s   RVSP:           32.8 mmHg MV E/A ratio:  1.56                             SHUNTS                             Systemic VTI:  0.29 m                             Systemic Diam: 1.75 cm Skeet Latch MD Electronically signed by Skeet Latch MD Signature Date/Time: 10/27/2021/5:58:45 PM    Final     Labs:  CBC: Recent Labs    01/20/21 1249 07/20/21 0903 07/20/21 1241  WBC 7.8 8.2 6.8  HGB 10.0* 9.6* 9.3*  HCT 30.5* 30.1* 29.6*  PLT 110* 94* 81*    COAGS: Recent Labs    07/20/21 0903  INR 1.1    BMP: Recent Labs    01/20/21 1249 07/20/21 1241  NA 137 134*  K 4.5 4.1  CL 101 101  CO2 27 26  GLUCOSE 111* 93  BUN 20 19  CALCIUM 10.0 9.1  CREATININE 1.13* 1.10*  GFRNONAA 51* 53*    LIVER FUNCTION TESTS: Recent Labs    01/20/21 1249 07/20/21 1241  BILITOT 0.9 0.9  AST 23 25  ALT 19 15  ALKPHOS 46 42  PROT 7.0 6.0*  ALBUMIN 4.1 3.4*    TUMOR MARKERS: No results  for input(s): AFPTM, CEA, CA199, CHROMGRNA in the last 8760 hours.  Assessment and Plan:  Rescheduled today for pre sacral  mass bx Risks and benefits of pre sacral mass biopsy was discussed with the patient and/or patient's family including, but not limited to bleeding, infection, damage to adjacent structures or low yield requiring additional tests.  All of the questions were answered and there is agreement to proceed. Consent signed and in chart.   Thank you for this interesting consult.  I greatly enjoyed meeting STAR RESLER and look forward to participating in their care.  A copy of this report was sent to the requesting provider on this date.  Electronically Signed: Lavonia Drafts, PA-C 11/09/2021, 10:06 AM   I spent a total of    25 Minutes in face to face in clinical consultation, greater than 50% of which was counseling/coordinating care for pre sacral mass bx

## 2021-11-09 NOTE — Procedures (Signed)
Vascular and Interventional Radiology Procedure Note  Patient: AMARAH BROSSMAN DOB: 1946/10/01 Medical Record Number: [id number] Note Date/Time: 11/09/21 12:12 PM   Performing Physician: Michaelle Birks, MD Assistant(s): None  Diagnosis: Pre sacral mass  Procedure:  CT-GUIDED PRE SACRAL PELVIS MAS BIOPSY  Anesthesia: Conscious Sedation Complications: None Estimated Blood Loss:  0 mL Specimens: Sent for Pathology  Findings:  Successful CT-guided biopsy of pre sacral pelvic mass. A total of 5 samples were obtained.  18G x5 cores. 2 sent in NS and 3 in Formalin Hemostasis of the tract was achieved using Gelfoam Slurry Embolization.  Plan: Bed rest for 1 hours.  See detailed procedure note with images in PACS. The patient tolerated the procedure well without incident or complication and was returned to Recovery in stable condition.    Michaelle Birks, MD [facility]   Pager. [place]

## 2021-11-10 DIAGNOSIS — Z1211 Encounter for screening for malignant neoplasm of colon: Secondary | ICD-10-CM | POA: Diagnosis not present

## 2021-11-10 DIAGNOSIS — K900 Celiac disease: Secondary | ICD-10-CM | POA: Diagnosis not present

## 2021-11-10 DIAGNOSIS — K625 Hemorrhage of anus and rectum: Secondary | ICD-10-CM | POA: Diagnosis not present

## 2021-11-10 DIAGNOSIS — K5730 Diverticulosis of large intestine without perforation or abscess without bleeding: Secondary | ICD-10-CM | POA: Diagnosis not present

## 2021-11-14 DIAGNOSIS — M25562 Pain in left knee: Secondary | ICD-10-CM | POA: Diagnosis not present

## 2021-11-16 LAB — SURGICAL PATHOLOGY

## 2021-11-25 ENCOUNTER — Telehealth: Payer: Self-pay | Admitting: Hematology

## 2021-11-25 DIAGNOSIS — H353131 Nonexudative age-related macular degeneration, bilateral, early dry stage: Secondary | ICD-10-CM | POA: Diagnosis not present

## 2021-11-25 NOTE — Telephone Encounter (Signed)
R/s pt's January appt to a sooner date per 11/29 referral and Dr. Burr Medico. Pt is aware of new appt date and time.

## 2021-11-28 DIAGNOSIS — M0579 Rheumatoid arthritis with rheumatoid factor of multiple sites without organ or systems involvement: Secondary | ICD-10-CM | POA: Diagnosis not present

## 2021-12-01 DIAGNOSIS — S83242A Other tear of medial meniscus, current injury, left knee, initial encounter: Secondary | ICD-10-CM | POA: Diagnosis not present

## 2021-12-01 DIAGNOSIS — M25561 Pain in right knee: Secondary | ICD-10-CM | POA: Diagnosis not present

## 2021-12-08 ENCOUNTER — Telehealth: Payer: Self-pay

## 2021-12-08 ENCOUNTER — Inpatient Hospital Stay: Payer: Medicare Other

## 2021-12-08 ENCOUNTER — Other Ambulatory Visit: Payer: Self-pay

## 2021-12-08 ENCOUNTER — Inpatient Hospital Stay: Payer: Medicare Other | Attending: Nurse Practitioner | Admitting: Nurse Practitioner

## 2021-12-08 VITALS — BP 129/53 | HR 98 | Temp 98.1°F | Resp 19 | Ht 63.0 in | Wt 126.6 lb

## 2021-12-08 DIAGNOSIS — D696 Thrombocytopenia, unspecified: Secondary | ICD-10-CM | POA: Insufficient documentation

## 2021-12-08 DIAGNOSIS — C8300 Small cell B-cell lymphoma, unspecified site: Secondary | ICD-10-CM | POA: Insufficient documentation

## 2021-12-08 DIAGNOSIS — Z7901 Long term (current) use of anticoagulants: Secondary | ICD-10-CM | POA: Diagnosis not present

## 2021-12-08 DIAGNOSIS — M25562 Pain in left knee: Secondary | ICD-10-CM | POA: Insufficient documentation

## 2021-12-08 DIAGNOSIS — M549 Dorsalgia, unspecified: Secondary | ICD-10-CM | POA: Diagnosis not present

## 2021-12-08 DIAGNOSIS — Z79899 Other long term (current) drug therapy: Secondary | ICD-10-CM | POA: Diagnosis not present

## 2021-12-08 DIAGNOSIS — D649 Anemia, unspecified: Secondary | ICD-10-CM | POA: Diagnosis not present

## 2021-12-08 DIAGNOSIS — I4891 Unspecified atrial fibrillation: Secondary | ICD-10-CM | POA: Diagnosis not present

## 2021-12-08 DIAGNOSIS — D638 Anemia in other chronic diseases classified elsewhere: Secondary | ICD-10-CM

## 2021-12-08 LAB — CBC WITH DIFFERENTIAL (CANCER CENTER ONLY)
Abs Immature Granulocytes: 0.07 10*3/uL (ref 0.00–0.07)
Basophils Absolute: 0 10*3/uL (ref 0.0–0.1)
Basophils Relative: 0 %
Eosinophils Absolute: 0 10*3/uL (ref 0.0–0.5)
Eosinophils Relative: 1 %
HCT: 31.9 % — ABNORMAL LOW (ref 36.0–46.0)
Hemoglobin: 10.3 g/dL — ABNORMAL LOW (ref 12.0–15.0)
Immature Granulocytes: 1 %
Lymphocytes Relative: 14 %
Lymphs Abs: 1.2 10*3/uL (ref 0.7–4.0)
MCH: 36.1 pg — ABNORMAL HIGH (ref 26.0–34.0)
MCHC: 32.3 g/dL (ref 30.0–36.0)
MCV: 111.9 fL — ABNORMAL HIGH (ref 80.0–100.0)
Monocytes Absolute: 0.8 10*3/uL (ref 0.1–1.0)
Monocytes Relative: 9 %
Neutro Abs: 6.8 10*3/uL (ref 1.7–7.7)
Neutrophils Relative %: 75 %
Platelet Count: 80 10*3/uL — ABNORMAL LOW (ref 150–400)
RBC: 2.85 MIL/uL — ABNORMAL LOW (ref 3.87–5.11)
RDW: 15 % (ref 11.5–15.5)
WBC Count: 8.9 10*3/uL (ref 4.0–10.5)
nRBC: 0 % (ref 0.0–0.2)

## 2021-12-08 LAB — RETICULOCYTES
Immature Retic Fract: 17.6 % — ABNORMAL HIGH (ref 2.3–15.9)
RBC.: 2.83 MIL/uL — ABNORMAL LOW (ref 3.87–5.11)
Retic Count, Absolute: 97.6 K/uL (ref 19.0–186.0)
Retic Ct Pct: 3.5 % — ABNORMAL HIGH (ref 0.4–3.1)

## 2021-12-08 LAB — CMP (CANCER CENTER ONLY)
ALT: 15 U/L (ref 0–44)
AST: 21 U/L (ref 15–41)
Albumin: 4.3 g/dL (ref 3.5–5.0)
Alkaline Phosphatase: 49 U/L (ref 38–126)
Anion gap: 13 (ref 5–15)
BUN: 19 mg/dL (ref 8–23)
CO2: 21 mmol/L — ABNORMAL LOW (ref 22–32)
Calcium: 9.6 mg/dL (ref 8.9–10.3)
Chloride: 103 mmol/L (ref 98–111)
Creatinine: 1.19 mg/dL — ABNORMAL HIGH (ref 0.44–1.00)
GFR, Estimated: 48 mL/min — ABNORMAL LOW (ref >60)
Glucose, Bld: 212 mg/dL — ABNORMAL HIGH (ref 70–99)
Potassium: 4.7 mmol/L (ref 3.5–5.1)
Sodium: 137 mmol/L (ref 135–145)
Total Bilirubin: 0.6 mg/dL (ref 0.3–1.2)
Total Protein: 7.1 g/dL (ref 6.5–8.1)

## 2021-12-08 NOTE — Progress Notes (Addendum)
[place]   [phone] [phone]   Clinic Follow up Note   Patient Care Team: [name], MD as PCP - General (Family Medicine) Gavin Pound, MD as Consulting Physician (Rheumatology) 12/08/2021  CHIEF COMPLAINT: Follow-up anemia and thrombocytopenia  CURRENT THERAPY: Observation  INTERVAL HISTORY: Ms. [name] returns with her husband for follow-up as scheduled, sitting in a wheelchair.  Last seen by Dr. Burr Medico 01/20/2021. Her macrocytic anemia and thrombocytopenia worsened over the past year from hemoglobin 10 down to 8.4 and platelet count 119 down to 76 on 11/09/2021.  Due to worsening back pain, ortho Dr. Mayer Camel obtained imaging that found a mass in her spine. She was due to undergo biopsy in July but was found to be in Afib so it was postponed. She she was started on Eliquis 09/2021. She had 1 episode of rectal bleeding on 10/29/21 and saw Dr. Collene Mares, planning colonoscopy in early 2023. 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   Today she presents in a wheelchair, with her husband. Her pain is worse in left knee more than her back. She hope she can get left knee tear repaired. She notes ortho will not operate until hem/onc issue is addressed. She denies fever, night sweats, unintentional weight loss, or adenopathy. She is limiting her po intake because she can not exercise due to her pain. She thinks if she can exercise this would really improve her condition.     MEDICAL HISTORY:  Past Medical History:  Diagnosis Date   Arthritis    Rheumatoid arthritis   Celiac disease    Chronic kidney disease    stage 3 from MD notes   COPD (chronic obstructive pulmonary disease) ([place])    Dyspnea    with going up stairs   Family history of adverse reaction to anesthesia    father had hard time waking up   Headache     sinus headaches   Hot flashes    Hypertension    Iron deficiency anemia    Pneumonia    per patient "I have walking pneumonia"    SURGICAL HISTORY: Past Surgical History:  Procedure Laterality Date   COLONOSCOPY     ECTOPIC PREGNANCY SURGERY      x 2   REVERSE SHOULDER ARTHROPLASTY Right 02/02/2017   Procedure: RIGHT REVERSE SHOULDER ARTHROPLASTY;  Surgeon: Netta Cedars, MD;  Location: [place];  Service: Orthopedics;  Laterality: Right;    I have reviewed the social history and family history with the patient and they are unchanged from previous note.  ALLERGIES:  is allergic to gluten meal, penicillins, fosamax [alendronate], and hydroxychloroquine.  MEDICATIONS:  Current Outpatient Medications  Medication Sig Dispense Refill   acetaminophen (TYLENOL) 500 MG tablet Take 1,000 mg by mouth every 6 (six) hours as needed for mild pain.     apixaban (ELIQUIS) 5 MG TABS tablet Take 1 tablet (5 mg total) by mouth 2 (two) times daily. (Patient not taking: No sig reported) 60 tablet 11   Calcium-Magnesium-Vitamin D (CALCIUM 1200+D3 PO) Take 1 tablet by mouth daily.     denosumab (PROLIA) 60 MG/ML SOSY injection Inject 60 mg into the skin every 6 (six) months.     gabapentin (NEURONTIN) 300 MG capsule Take 300 mg by mouth 3 (three) times daily.      InFLIXimab (REMICADE IV) Inject 10 mg into the vein every 8 (eight) weeks.  losartan (COZAAR) 50 MG tablet Take 50 mg by mouth daily.      metoprolol succinate (TOPROL-XL) 50 MG 24 hr tablet Take 50 mg by mouth every morning.      predniSONE (DELTASONE) 10 MG tablet Take 10 mg by mouth daily.     Tiotropium Bromide-Olodaterol (STIOLTO RESPIMAT) 2.5-2.5 MCG/ACT AERS Inhale 2 puffs into the lungs daily. 4 g 11   vitamin C (ASCORBIC ACID) 500 MG tablet Take 500 mg by mouth daily.     No current facility-administered medications for this visit.    PHYSICAL EXAMINATION: ECOG PERFORMANCE STATUS: 2 - Symptomatic, <50% confined to bed  Vitals:    12/08/21 1053  BP: (!) 129/53  Pulse: 98  Resp: 19  Temp: 98.1 F (36.7 C)  SpO2: 96%   Filed Weights   12/08/21 1053  Weight: 126 lb 9.6 oz (57.4 kg)    GENERAL:alert, no distress and comfortable SKIN: no rash  EYES:  sclera clear LYMPH:  no palpable cervical, supraclavicular, axillary, or inguinal lymphadenopathy  LUNGS: clear with normal breathing effort HEART: regular rate & rhythm, no lower extremity edema ABDOMEN:abdomen soft, non-tender and normal bowel sounds Musculoskeletal: tenderness in the sacral spine  NEURO: alert & oriented x 3 with fluent speech  LABORATORY DATA:  I have reviewed the data as listed CBC Latest Ref Rng & Units 12/08/2021 11/09/2021 07/20/2021  WBC 4.0 - 10.5 K/uL 8.9 7.1 6.8  Hemoglobin 12.0 - 15.0 g/dL 10.3(L) 8.4(L) 9.3(L)  Hematocrit 36.0 - 46.0 % 31.9(L) 28.0(L) 29.6(L)  Platelets 150 - 400 K/uL 80(L) 76(L) 81(L)     CMP Latest Ref Rng & Units 12/08/2021 07/20/2021 01/20/2021  Glucose 70 - 99 mg/dL 212(H) 93 111(H)  BUN 8 - 23 mg/dL 19 19 20   Creatinine 0.44 - 1.00 mg/dL 1.19(H) 1.10(H) 1.13(H)  Sodium 135 - 145 mmol/L 137 134(L) 137  Potassium 3.5 - 5.1 mmol/L 4.7 4.1 4.5  Chloride 98 - 111 mmol/L 103 101 101  CO2 22 - 32 mmol/L 21(L) 26 27  Calcium 8.9 - 10.3 mg/dL 9.6 9.1 10.0  Total Protein 6.5 - 8.1 g/dL 7.1 6.0(L) 7.0  Total Bilirubin 0.3 - 1.2 mg/dL 0.6 0.9 0.9  Alkaline Phos 38 - 126 U/L 49 42 46  AST 15 - 41 U/L 21 25 23   ALT 0 - 44 U/L 15 15 19       RADIOGRAPHIC STUDIES: I have personally reviewed the radiological images as listed and agreed with the findings in the report. No results found.   ASSESSMENT & PLAN: 75 yo female with     1. 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 SPEP and UPEP with immunofixation were negative. No lab evidence of  hemolysis, erythropoietin level is normal.  -Sedimentation rate was normal, she does have chronic inflammation from RA. 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 Her peripheral white count has been normal, no elevated lymphocytes -CT scan from 03/2015, which was negative for adenopathy or splenomegaly. 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 It has not required treatment.  -she has been under observation -She developed worsening  back pain in Summer 2022 and underwent work up, imaging showed mass in the presacral space. I have requested those records.  -CT biopsy 11/09/21 showed monoclonal B cell population with co-expression of CD5 comprising 17% of all lymphocytes, and myeloblast population comprising 2%. 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  -Mr. Glaeser appears stable. She has no B symptoms or adenopathy on exam. Her main concern is left knee pain and back pain. We reviewed the possibility her back pain is from SLL mass.  -She had worsening anemia and thrombocytopenia lately lowest hgb 8.4 and plt 76. Hgb is better today, 10.3, plt 80 -we recommend staging work up with CT CAP and bone marrow biopsy if path is not able to run cytogenetics on soft tissue bx.  -we will see her back 1 week after CT and bone marrow, to review results and finalize a treatment plan.  -Patient seen with Dr. Burr Medico.      2. Rheumatoid arthritis -on Remicade q8 weeks per rheumatologist Dr. Trudie Reed. We reviewed the risk of lymphoma and immunosupression on Remicade -I will cc my note to Dr. Trudie Reed to consider an alternative given the new diagnosis  -She underwent right shoulder surgery  in 01/2017, arthritis in that location much improved. -Her current flare of pain is in left knee, hopeful she can have it repaired. Ok from hematology standpoint. But more invasive procedure such as knee replacement would not be ideal at this time.  -She takes vitamin supplements and gets prolia injection q91month with her PCP.    PLAN: -Lab, biopsy reviewed -SLL staging CT CAP in 1-2 weeks -Will check with path if they can add molecular studies/cytogenetics to soft tissue bx 11/09/21 -If not will proceed with bone marrow biopsy -F/up 1 week after CT/bx to review results and finalize treatment plan -Cc note to Dr. HTrudie Reedre: remicade and new dx CLL/SLL -Cc note to Dr. RMayer Camelre: ok to proceed with left knee repair in the interim -If she does undergo [facility]o surgery[facility]  -Patient seen with Dr. FBurr Medico  Orders Placed This Encounter  Procedures   CT CHEST ABDOMEN PELVIS W CONTRAST    Standing Status:   Future    Standing Expiration Date:   12/08/2022    Order Specific Question:   [facility]    [facility]   Order Specific Question:   Is Oral Contrast requested for this exam?    Answer:   Yes, Per Radiology protocol   CT BONE MARROW BIOPSY & ASPIRATION    Standing Status:   Future    Standing Expiration Date:   12/08/2022    Order Specific Question:   Reason for Exam (SYMPTOM  OR DIAGNOSIS REQUIRED)    Answer:   soft tissue biopsy confirmed CLL/SLL, previous bone marrow biopsy 2016    Order Specific Question:   [facility]    [facility]   All questions were answered. The patient knows to call the clinic with any problems, questions or concerns. No barriers to learning were detected.     LAlla Feeling NP 12/08/21    Addendum I have seen the patient, examined her. I agree with the assessment and and plan and have edited the notes.   SRaymiewas found to have a presacral mass and biopsy  showed CLL/SLL. This is unusual that she does not have lymphocytosis. No palpable  adenopathy on exam today. I recommend staging CT CAP with contrast and bone marrow biopsy to complete staging and molecular testing. Her chronic mild anemia and thrombocytopenia are stable, not sure it's related to CLL/SLL. I briefly discussed the indication for CLL/SLL. Although she does not have B symptoms, she is symptomatic from the presacral mass. She will likely need treatment. Given her advanced age, targeted therapy such as BTK inhibitor is probably preferred. Will finalized her treatment plan after the above work up. All questions were answered.   Truitt Merle  12/08/2021

## 2021-12-08 NOTE — Telephone Encounter (Signed)
VM left for HIM at [facility] requesting imaging, procedures and bx. I am awaiting a return call. I have also provided our fax number.

## 2021-12-09 ENCOUNTER — Encounter: Payer: Self-pay | Admitting: Nurse Practitioner

## 2021-12-12 DIAGNOSIS — M0579 Rheumatoid arthritis with rheumatoid factor of multiple sites without organ or systems involvement: Secondary | ICD-10-CM | POA: Diagnosis not present

## 2021-12-12 DIAGNOSIS — M545 Low back pain, unspecified: Secondary | ICD-10-CM | POA: Diagnosis not present

## 2021-12-12 DIAGNOSIS — M150 Primary generalized (osteo)arthritis: Secondary | ICD-10-CM | POA: Diagnosis not present

## 2021-12-12 DIAGNOSIS — Z6822 Body mass index (BMI) 22.0-22.9, adult: Secondary | ICD-10-CM | POA: Diagnosis not present

## 2021-12-12 DIAGNOSIS — E790 Hyperuricemia without signs of inflammatory arthritis and tophaceous disease: Secondary | ICD-10-CM | POA: Diagnosis not present

## 2021-12-12 DIAGNOSIS — Z79899 Other long term (current) drug therapy: Secondary | ICD-10-CM | POA: Diagnosis not present

## 2021-12-12 DIAGNOSIS — C9110 Chronic lymphocytic leukemia of B-cell type not having achieved remission: Secondary | ICD-10-CM | POA: Diagnosis not present

## 2021-12-13 ENCOUNTER — Telehealth: Payer: Self-pay | Admitting: Hematology

## 2021-12-13 NOTE — Telephone Encounter (Signed)
Left message with follow-up appointment per 12/15 los.

## 2021-12-14 ENCOUNTER — Other Ambulatory Visit: Payer: Self-pay

## 2021-12-16 ENCOUNTER — Other Ambulatory Visit: Payer: Self-pay

## 2021-12-23 ENCOUNTER — Other Ambulatory Visit: Payer: Self-pay

## 2021-12-23 ENCOUNTER — Telehealth: Payer: Self-pay | Admitting: *Deleted

## 2021-12-23 ENCOUNTER — Ambulatory Visit (HOSPITAL_COMMUNITY)
Admission: RE | Admit: 2021-12-23 | Discharge: 2021-12-23 | Disposition: A | Discharge status: Home / Self Care | Payer: Medicare Other | Source: Ambulatory Visit | Attending: Nurse Practitioner | Admitting: Nurse Practitioner

## 2021-12-23 DIAGNOSIS — J432 Centrilobular emphysema: Secondary | ICD-10-CM | POA: Diagnosis not present

## 2021-12-23 DIAGNOSIS — J8410 Pulmonary fibrosis, unspecified: Secondary | ICD-10-CM | POA: Diagnosis not present

## 2021-12-23 DIAGNOSIS — K5730 Diverticulosis of large intestine without perforation or abscess without bleeding: Secondary | ICD-10-CM | POA: Diagnosis not present

## 2021-12-23 DIAGNOSIS — C9110 Chronic lymphocytic leukemia of B-cell type not having achieved remission: Secondary | ICD-10-CM | POA: Diagnosis not present

## 2021-12-23 DIAGNOSIS — C8590 Non-Hodgkin lymphoma, unspecified, unspecified site: Secondary | ICD-10-CM | POA: Diagnosis not present

## 2021-12-23 DIAGNOSIS — C8300 Small cell B-cell lymphoma, unspecified site: Secondary | ICD-10-CM | POA: Diagnosis not present

## 2021-12-23 DIAGNOSIS — N281 Cyst of kidney, acquired: Secondary | ICD-10-CM | POA: Diagnosis not present

## 2021-12-23 MED ORDER — SODIUM CHLORIDE (PF) 0.9 % IJ SOLN
INTRAMUSCULAR | Status: AC
  Filled 2021-12-23: qty 50

## 2021-12-23 MED ORDER — IOHEXOL 350 MG/ML SOLN
80.0000 mL | Freq: Once | INTRAVENOUS | Status: AC | PRN
  Administered 2021-12-23: 13:00:00 80 mL via INTRAVENOUS

## 2021-12-23 NOTE — Telephone Encounter (Signed)
° °  Pre-operative Risk Assessment    Patient Name: [name]  DOB: [birth year] MRN: [id number]      Request for Surgical Clearance    Procedure:   COLONOSCOPY  Date of Surgery:  Clearance 01/25/22                                 Surgeon:  DR. MANN Surgeon's Group or Practice Name:  [facility] Phone number:  [phone] Fax number:  [phone]   Type of Clearance Requested:   - Medical  - Pharmacy:  Hold Apixaban (Eliquis)     Type of Anesthesia:   PROPOFOL   Additional requests/questions:    Jiles Prows   12/23/2021, 1:53 PM

## 2021-12-23 NOTE — Telephone Encounter (Signed)
Patient Name: [name]  DOB: 1946-05-10 MRN: [id number]   Request for medical clearance as well as request to hold Eliquis for colonoscopy on 01/25/22. She was started on Eliqius 10/04/21 by Dr. Acie Fredrickson for PAF. She has a follow-up appointment with him on 01/10/22. Will forward to Pharm D for recommendations regarding Eliquis and will notify patient and requesting office of patient's upcoming appointment.

## 2021-12-23 NOTE — Telephone Encounter (Signed)
° °  Primary Cardiologist: None  Chart reviewed as part of pre-operative protocol coverage. Given past medical history and time since last visit, based on ACC/AHA guidelines, [name] would be at acceptable risk for the planned procedure without further cardiovascular testing.   Her RCRI risk of MACE is low, Class I 0.4%.  Patient was contacted on 12/23/21. She reports she is not active due to a herniated disc and arthritis. She denies chest discomfort, dyspnea, orthopnea, palpitations, PND, presyncope or syncope. She has an appointment on 01/10/22 with Dr. Acie Fredrickson.   Patient was advised that if she develops new symptoms prior to surgery to contact our office to arrange a follow-up appointment.  He verbalized understanding.  I will route this recommendation to the requesting party via Epic fax function and remove from pre-op pool.  Please call with questions.  Emmaline Life, NP-C    12/23/2021, 2:38 PM [place] [address], [address] Office [phone] Fax [phone]

## 2021-12-23 NOTE — Telephone Encounter (Signed)
Patient with diagnosis of A Fib on Eliquis for anticoagulation.    Procedure: colonoscopy Date of procedure: 01/25/22   CHA2DS2-VASc Score = 4  This indicates a 4.8% annual risk of stroke. The patient's score is based upon: CHF History: 0 HTN History: 1 Diabetes History: 0 Stroke History: 0 Vascular Disease History: 0 Age Score: 2 Gender Score: 1   CrCl 37 mL/min Platelet count 80K  Per office protocol, patient can hold Eliquis for 2 days prior to procedure.

## 2021-12-27 DIAGNOSIS — M25562 Pain in left knee: Secondary | ICD-10-CM | POA: Diagnosis not present

## 2022-01-04 ENCOUNTER — Other Ambulatory Visit: Payer: Self-pay | Admitting: Radiology

## 2022-01-04 NOTE — H&P (Signed)
Chief Complaint: Patient was seen in consultation today for bone marrow aspiration/biopsy.  Referring Physician(s): Burton,Lacie K  Supervising Physician: Ruthann Cancer  Patient Status: [facility] - Out-pt  History of Present Illness: [name] is a 76 y.o. female with a past medical history significant for RA, HTN, CKD, COPD, anemia and thrombocytopenia who presents today for a bone marrow aspiration/biopsy. [name] has been followed by hematology for anemia and thrombocytopenia since 2016. 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 She has been referred to IR for a bone marrow aspiration/biopsy for molecular studies and staging purposes.  Past Medical History:  Diagnosis Date   Arthritis    Rheumatoid arthritis   Celiac disease    Chronic kidney disease    stage 3 from MD notes   COPD (chronic obstructive pulmonary disease) ([place])    Dyspnea    with going up stairs   Family history of adverse reaction to anesthesia    father had hard time waking up   Headache    sinus headaches   Hot flashes    Hypertension    Iron deficiency anemia    Pneumonia    per patient "I have walking pneumonia"    Past Surgical History:  Procedure Laterality Date   COLONOSCOPY     ECTOPIC PREGNANCY SURGERY      x 2   REVERSE SHOULDER ARTHROPLASTY Right 02/02/2017   Procedure: RIGHT REVERSE SHOULDER ARTHROPLASTY;  Surgeon: Netta Cedars, MD;  Location: [place];  Service: Orthopedics;  Laterality: Right;    Allergies: Gluten meal, Penicillins, Fosamax [alendronate], and Hydroxychloroquine  Medications: Prior to Admission medications   Medication Sig Start Date End Date Taking? Authorizing Provider  acetaminophen (TYLENOL) 500 MG tablet Take 1,000 mg by mouth every 6 (six) hours as needed for mild pain.     [provider]  apixaban (ELIQUIS) 5 MG TABS tablet Take 1 tablet (5 mg total) by mouth 2 (two) times daily. Patient not taking: No sig reported 10/04/21   [name], Wonda Cheng, MD  Calcium-Magnesium-Vitamin D (CALCIUM 1200+D3 PO) Take 1 tablet by mouth daily.    [provider]  denosumab (PROLIA) 60 MG/ML SOSY injection Inject 60 mg into the skin every 6 (six) months.    [provider]  gabapentin (NEURONTIN) 300 MG capsule Take 300 mg by mouth 3 (three) times daily.  07/21/14   [provider]  InFLIXimab (REMICADE IV) Inject 10 mg into the vein every 8 (eight) weeks.    [provider]  losartan (COZAAR) 50 MG tablet Take 50 mg by mouth daily.  05/20/19   [provider]  metoprolol succinate (TOPROL-XL) 50 MG 24 hr tablet Take 50 mg by mouth every morning.  07/21/14   [provider]  predniSONE (DELTASONE) 5 MG tablet Take 5 mg by mouth daily. TAKE 1 TABLET ORALLY ONCE A DAY FOR 30 DAYS 12/12/21   Gavin Pound, MD  Tiotropium Bromide-Olodaterol (STIOLTO RESPIMAT) 2.5-2.5 MCG/ACT AERS Inhale 2 puffs into the lungs daily. 05/03/21   Martyn Ehrich, NP  vitamin C (ASCORBIC ACID) 500 MG tablet Take 500 mg by mouth daily.    [provider]     Family History  Problem Relation Age of Onset   Peripheral Artery Disease Father    Diabetes Father     Social History  Socioeconomic History   Marital status: Married    Spouse name: Not on file   Number of children: Not on file   Years of education: Not on file   Highest education level: Not on file  Occupational History   Not on file  Tobacco Use   Smoking status: Former    Packs/day: 1.00    Years: 30.00    Pack years: 30.00    Types: Cigarettes    Quit date: 2005    Years since quitting: 18.0   Smokeless tobacco: Never  Vaping Use   Vaping Use: Never used  Substance and Sexual Activity   Alcohol use: Yes    Alcohol/week: 12.0 - 14.0 standard drinks     Types: 12 - 14 Glasses of wine per week   Drug use: No   Sexual activity: Not on file  Other Topics Concern   Not on file  Social History Narrative   Not on file   Social Determinants of Health   Financial Resource Strain: Not on file  Food Insecurity: Not on file  Transportation Needs: Not on file  Physical Activity: Not on file  Stress: Not on file  Social Connections: Not on file     Review of Systems: A 12 point ROS discussed and pertinent positives are indicated in the HPI above.  All other systems are negative.  Review of Systems  Constitutional:  Negative for chills and fever.  Respiratory:  Negative for cough and shortness of breath.   Cardiovascular:  Negative for chest pain.  Gastrointestinal:  Negative for abdominal pain, nausea and vomiting.  Musculoskeletal:  Positive for arthralgias (shoulder/knee - chronic, needs replacement).  Neurological:  Positive for weakness. Negative for headaches.   Vital Signs: BP 134/65    Pulse 78    Temp 98.1 F (36.7 C) (Oral)    Resp 16    SpO2 96%   Physical Exam Vitals and nursing note reviewed.  Constitutional:      General: She is not in acute distress. HENT:     Head: Normocephalic.     Mouth/Throat:     Mouth: Mucous membranes are moist.     Pharynx: Oropharynx is clear. No oropharyngeal exudate or posterior oropharyngeal erythema.  Cardiovascular:     Rate and Rhythm: Normal rate. Rhythm irregular.     Heart sounds: Murmur (? mild if present) heard.  Pulmonary:     Effort: Pulmonary effort is normal.     Breath sounds: Normal breath sounds.  Abdominal:     General: There is no distension.     Palpations: Abdomen is soft.     Tenderness: There is no abdominal tenderness.  Skin:    General: Skin is warm and dry.  Neurological:     Mental Status: She is alert and oriented to person, place, and time.  Psychiatric:        Mood and Affect: Mood normal.        Behavior: Behavior normal.        Thought Content:  Thought content normal.        Judgment: Judgment normal.     MD Evaluation Airway: WNL Heart: WNL Abdomen: WNL Chest/ Lungs: WNL ASA  Classification: 3 Mallampati/Airway Score: One   Imaging: CT CHEST ABDOMEN PELVIS W CONTRAST  Result Date: 12/27/2021 CLINICAL DATA:  CLL/SLL staging, biopsy-proven presacral mass EXAM: CT CHEST, ABDOMEN, AND PELVIS WITH CONTRAST TECHNIQUE: Multidetector CT imaging of the chest, abdomen and pelvis was performed following the standard  protocol during bolus administration of intravenous contrast. CONTRAST:  55m OMNIPAQUE IOHEXOL 350 MG/ML SOLN, additional oral enteric contrast COMPARISON:  MR pelvis, 07/04/2021, CT chest, 06/25/2021, 02/20/2020 FINDINGS: CT CHEST FINDINGS Cardiovascular: Aortic atherosclerosis. Normal heart size. No pericardial effusion. Mediastinum/Nodes: No enlarged mediastinal, hilar, or axillary lymph nodes. Thyroid gland, trachea, and esophagus demonstrate no significant findings. Lungs/Pleura: Mild centrilobular and paraseptal emphysema. There is mild, bibasilar predominant pulmonary fibrosis in a pattern featuring irregular peripheral interstitial opacity, septal thickening, but without clear evidence of subpleural bronchiolectasis or honeycombing, with a somewhat asymmetric distribution most conspicuously involving the right lower lobe and lingula (series 6, image 115). Pleural nodule of the dependent right lower lobe overlying the posterior right tenth rib measuring 2.0 x 0.9 cm, previously 1.6 x 0.9 cm (series 2, image 47). Pleural or paraspinous soft tissue mass overlying the right aspect the T10 vertebral body and measuring 4.2 x 2.1 cm, previously 4.0 x 1.6 cm (series 2, image 46). No pleural effusion or pneumothorax. Musculoskeletal: No chest wall mass or suspicious bone lesions identified. CT ABDOMEN PELVIS FINDINGS Hepatobiliary: No solid liver abnormality is seen. No gallstones, gallbladder wall thickening, or biliary dilatation.  Pancreas: Unremarkable. No pancreatic ductal dilatation or surrounding inflammatory changes. Spleen: Normal in size without significant abnormality. Adrenals/Urinary Tract: Adrenal glands are unremarkable. Multiple small renal cysts. Kidneys are otherwise normal, without renal calculi, solid lesion, or hydronephrosis. Bladder is unremarkable. Stomach/Bowel: Stomach is within normal limits. Small incidental diverticulum of the descending duodenum. Appendix appears normal. No evidence of bowel wall thickening, distention, or inflammatory changes. Descending and sigmoid diverticulosis. Vascular/Lymphatic: Aortic atherosclerosis. No enlarged abdominal or pelvic lymph nodes. Reproductive: No mass or other abnormality. Other: No abdominal wall hernia or abnormality. No abdominopelvic ascites. Musculoskeletal: No acute osseous findings. Slight interval enlargement of a presacral soft tissue mass measuring 7.2 x 4.7 cm, previously 6.9 x 4.1 cm (series 2, image 105). IMPRESSION: 1. Slight interval enlargement of a presacral soft tissue mass measuring 7.2 x 4.7 cm, previously 6.9 x 4.1 cm on prior MR of the pelvis dated 07/04/2021. By report, this represents a biopsy proven lymphoma. 2. Pleural nodule of the dependent right lower lobe overlying the posterior right tenth rib and pleural or paraspinous soft tissue mass overlying the right aspect of the T10 vertebral body, very slightly enlarged compared to prior examination of the chest dated 06/25/2020, consistent with additional sites of lymphomatous involvement given very indolent growth. These could be better assessed for metabolic activity by FDG PET/CT if desired. 3. There is mild, bibasilar predominant pulmonary fibrosis in a pattern featuring irregular peripheral interstitial opacity, septal thickening, but without clear evidence of subpleural bronchiolectasis or honeycombing, with a somewhat asymmetric distribution most conspicuously involving the right lower lobe and  lingula. These findings are significantly worsened when compared to prior examination dated 06/25/2020, particularly in the right lower lobe. Given interval change, this may reflect sequelae of interval infection or aspiration, however appearance is generally suspicious for fibrotic interstitial lung disease, and if characterized by ATS pulmonary fibrosis is in an "indeterminate for UIP" pattern, differential considerations including both UIP and NSIP. 4. Emphysema. Aortic Atherosclerosis (ICD10-I70.0) and Emphysema (ICD10-J43.9). Electronically Signed   By: ADelanna AhmadiM.D.   On: 12/27/2021 09:11    Labs:  CBC: Recent Labs    07/20/21 0903 07/20/21 1241 11/09/21 0927 12/08/21 1029  WBC 8.2 6.8 7.1 8.9  HGB 9.6* 9.3* 8.4* 10.3*  HCT 30.1* 29.6* 28.0* 31.9*  PLT 94* 81* 76* 80*  COAGS: Recent Labs    07/20/21 0903 11/09/21 0927  INR 1.1 1.1    BMP: Recent Labs    01/20/21 1249 07/20/21 1241 12/08/21 1029  NA 137 134* 137  K 4.5 4.1 4.7  CL 101 101 103  CO2 27 26 21*  GLUCOSE 111* 93 212*  BUN _0 CALCIUM 10.0 9.1 9.6  CREATININE 1.13* 1.10* 1.19*  GFRNONAA 51* 53* 48*    LIVER FUNCTION TESTS: Recent Labs    01/20/21 1249 07/20/21 1241 12/08/21 1029  BILITOT 0.9 0.9 0.6  AST _1 ALT _2 ALKPHOS 46 42 49  PROT 7.0 6.0* 7.1  ALBUMIN 4.1 3.4* 4.3    TUMOR MARKERS: No results for input(s): AFPTM, CEA, CA199, CHROMGRNA in the last 8760 hours.  Assessment and Plan:  76 y/o F with recently diagnosed CLL/SLL who presents today for a bone marrow aspiration/biopsy for molecular testing/staging.   Risks and benefits of bone marrow aspiration/biopsy was discussed with the patient and/or patient's family including, but not limited to bleeding, infection, damage to adjacent structures or low yield requiring additional tests.  All of the questions were answered and there is agreement to proceed.  Consent signed and in chart.   Thank you for  this interesting consult.  I greatly enjoyed meeting [name] and look forward to participating in their care.  A copy of this report was sent to the requesting provider on this date.  Electronically Signed: Joaquim Nam, PA-C 01/04/2022, 11:44 AM   I spent a total of  25 Minutes in face to face in clinical consultation, greater than 50% of which was counseling/coordinating care for bone marrow aspiration/biopsy.

## 2022-01-05 ENCOUNTER — Ambulatory Visit (HOSPITAL_COMMUNITY)
Admission: RE | Admit: 2022-01-05 | Discharge: 2022-01-05 | Disposition: A | Discharge status: Home / Self Care | Payer: Medicare Other | Source: Ambulatory Visit | Attending: Nurse Practitioner | Admitting: Nurse Practitioner

## 2022-01-05 ENCOUNTER — Other Ambulatory Visit: Payer: Self-pay | Admitting: Nurse Practitioner

## 2022-01-05 ENCOUNTER — Other Ambulatory Visit: Payer: Self-pay

## 2022-01-05 ENCOUNTER — Encounter (HOSPITAL_COMMUNITY): Payer: Self-pay

## 2022-01-05 DIAGNOSIS — I129 Hypertensive chronic kidney disease with stage 1 through stage 4 chronic kidney disease, or unspecified chronic kidney disease: Secondary | ICD-10-CM | POA: Diagnosis not present

## 2022-01-05 DIAGNOSIS — M069 Rheumatoid arthritis, unspecified: Secondary | ICD-10-CM | POA: Diagnosis not present

## 2022-01-05 DIAGNOSIS — D649 Anemia, unspecified: Secondary | ICD-10-CM | POA: Diagnosis not present

## 2022-01-05 DIAGNOSIS — C8300 Small cell B-cell lymphoma, unspecified site: Secondary | ICD-10-CM

## 2022-01-05 DIAGNOSIS — M25569 Pain in unspecified knee: Secondary | ICD-10-CM | POA: Diagnosis not present

## 2022-01-05 DIAGNOSIS — N183 Chronic kidney disease, stage 3 unspecified: Secondary | ICD-10-CM | POA: Insufficient documentation

## 2022-01-05 DIAGNOSIS — R531 Weakness: Secondary | ICD-10-CM | POA: Insufficient documentation

## 2022-01-05 DIAGNOSIS — J449 Chronic obstructive pulmonary disease, unspecified: Secondary | ICD-10-CM | POA: Diagnosis not present

## 2022-01-05 DIAGNOSIS — G8929 Other chronic pain: Secondary | ICD-10-CM | POA: Diagnosis not present

## 2022-01-05 DIAGNOSIS — D696 Thrombocytopenia, unspecified: Secondary | ICD-10-CM | POA: Diagnosis not present

## 2022-01-05 DIAGNOSIS — M25519 Pain in unspecified shoulder: Secondary | ICD-10-CM | POA: Diagnosis not present

## 2022-01-05 DIAGNOSIS — D72821 Monocytosis (symptomatic): Secondary | ICD-10-CM | POA: Diagnosis not present

## 2022-01-05 DIAGNOSIS — C9110 Chronic lymphocytic leukemia of B-cell type not having achieved remission: Secondary | ICD-10-CM | POA: Insufficient documentation

## 2022-01-05 DIAGNOSIS — D539 Nutritional anemia, unspecified: Secondary | ICD-10-CM | POA: Diagnosis not present

## 2022-01-05 DIAGNOSIS — C8350 Lymphoblastic (diffuse) lymphoma, unspecified site: Secondary | ICD-10-CM | POA: Diagnosis not present

## 2022-01-05 LAB — CBC WITH DIFFERENTIAL/PLATELET
Abs Immature Granulocytes: 0.13 10*3/uL — ABNORMAL HIGH (ref 0.00–0.07)
Basophils Absolute: 0 10*3/uL (ref 0.0–0.1)
Basophils Relative: 0 %
Eosinophils Absolute: 0.1 10*3/uL (ref 0.0–0.5)
Eosinophils Relative: 1 %
HCT: 30.6 % — ABNORMAL LOW (ref 36.0–46.0)
Hemoglobin: 9.9 g/dL — ABNORMAL LOW (ref 12.0–15.0)
Immature Granulocytes: 1 %
Lymphocytes Relative: 20 %
Lymphs Abs: 2.1 10*3/uL (ref 0.7–4.0)
MCH: 36.3 pg — ABNORMAL HIGH (ref 26.0–34.0)
MCHC: 32.4 g/dL (ref 30.0–36.0)
MCV: 112.1 fL — ABNORMAL HIGH (ref 80.0–100.0)
Monocytes Absolute: 3.5 10*3/uL — ABNORMAL HIGH (ref 0.1–1.0)
Monocytes Relative: 33 %
Neutro Abs: 4.6 10*3/uL (ref 1.7–7.7)
Neutrophils Relative %: 45 %
Platelets: 83 10*3/uL — ABNORMAL LOW (ref 150–400)
RBC: 2.73 MIL/uL — ABNORMAL LOW (ref 3.87–5.11)
RDW: 14.7 % (ref 11.5–15.5)
WBC: 10.5 10*3/uL (ref 4.0–10.5)
nRBC: 0 % (ref 0.0–0.2)

## 2022-01-05 MED ORDER — NALOXONE HCL 0.4 MG/ML IJ SOLN
INTRAMUSCULAR | Status: AC
  Filled 2022-01-05: qty 1

## 2022-01-05 MED ORDER — LIDOCAINE HCL (PF) 1 % IJ SOLN
INTRAMUSCULAR | Status: AC | PRN
  Administered 2022-01-05: 10 mL via INTRADERMAL

## 2022-01-05 MED ORDER — FENTANYL CITRATE (PF) 100 MCG/2ML IJ SOLN
INTRAMUSCULAR | Status: AC
  Filled 2022-01-05: qty 2

## 2022-01-05 MED ORDER — MIDAZOLAM HCL 2 MG/2ML IJ SOLN
INTRAMUSCULAR | Status: AC | PRN
  Administered 2022-01-05: 1 mg via INTRAVENOUS

## 2022-01-05 MED ORDER — MIDAZOLAM HCL 2 MG/2ML IJ SOLN
INTRAMUSCULAR | Status: AC | PRN
  Administered 2022-01-05: .5 mg via INTRAVENOUS

## 2022-01-05 MED ORDER — MIDAZOLAM HCL 2 MG/2ML IJ SOLN
INTRAMUSCULAR | Status: AC
  Filled 2022-01-05: qty 2

## 2022-01-05 MED ORDER — FENTANYL CITRATE (PF) 100 MCG/2ML IJ SOLN
INTRAMUSCULAR | Status: AC | PRN
  Administered 2022-01-05: 50 ug via INTRAVENOUS

## 2022-01-05 MED ORDER — FENTANYL CITRATE (PF) 100 MCG/2ML IJ SOLN
INTRAMUSCULAR | Status: AC | PRN
  Administered 2022-01-05: 25 ug via INTRAVENOUS

## 2022-01-05 MED ORDER — FLUMAZENIL 0.5 MG/5ML IV SOLN
INTRAVENOUS | Status: AC
  Filled 2022-01-05: qty 5

## 2022-01-05 MED ORDER — SODIUM CHLORIDE 0.9 % IV SOLN: INTRAVENOUS | Status: DC

## 2022-01-05 NOTE — Procedures (Signed)
Interventional Radiology Procedure Note  Procedure: CT guided aspirate and core biopsy of right iliac bone  Complications: None  Recommendations: - Bedrest supine x 1 hrs - Hydrocodone PRN  Pain - Follow biopsy results   Margarita Croke, MD   

## 2022-01-09 LAB — SURGICAL PATHOLOGY

## 2022-01-10 ENCOUNTER — Ambulatory Visit (INDEPENDENT_AMBULATORY_CARE_PROVIDER_SITE_OTHER): Payer: Medicare Other | Admitting: Cardiovascular Disease

## 2022-01-10 ENCOUNTER — Encounter: Payer: Self-pay | Admitting: Cardiovascular Disease

## 2022-01-10 ENCOUNTER — Other Ambulatory Visit: Payer: Self-pay

## 2022-01-10 VITALS — BP 138/70 | HR 67 | Ht 63.0 in | Wt 127.0 lb

## 2022-01-10 DIAGNOSIS — I480 Paroxysmal atrial fibrillation: Secondary | ICD-10-CM

## 2022-01-10 LAB — SURGICAL PATHOLOGY

## 2022-01-10 NOTE — Telephone Encounter (Signed)
Will send note back to pre-op pool for completion of any notes.

## 2022-01-10 NOTE — Telephone Encounter (Signed)
Eliquis hold instructions for upcoming colonoscopy per Dr. Acie Fredrickson.  Below was written on pts AVS from where Dr. Acie Fredrickson saw her in clinic today.    Hold her Eliquis for 2 days prior to your colonoscopy.  Resume the Eliquis as soon as it safe from a GI standpoint.  This is usually the day after your colonoscopy.

## 2022-01-10 NOTE — Telephone Encounter (Signed)
° °  Primary Cardiologist: Mertie Moores, MD  Chart reviewed as part of pre-operative protocol coverage. Given past medical history and time since last visit, based on ACC/AHA guidelines, [name] would be at acceptable risk for the planned procedure without further cardiovascular testing.   Patient with diagnosis of A Fib on Eliquis for anticoagulation.     Procedure: colonoscopy Date of procedure: 01/25/22     CHA2DS2-VASc Score = 4  This indicates a 4.8% annual risk of stroke. The patient's score is based upon: CHF History: 0 HTN History: 1 Diabetes History: 0 Stroke History: 0 Vascular Disease History: 0 Age Score: 2 Gender Score: 1     CrCl 37 mL/min Platelet count 80K   Per office protocol, patient can hold Eliquis for 2 days prior to procedure.   I will route this recommendation to the requesting party via Epic fax function and remove from pre-op pool.  Please call with questions.  [name]. Sherry Nettleton NP-C    01/10/2022, 12:53 PM [place] [address] Office [phone] Fax [phone]

## 2022-01-10 NOTE — Patient Instructions (Signed)
Medication Instructions:   Your physician recommends that you continue on your current medications as directed. Please refer to the Current Medication list given to you today.  *If you need a refill on your cardiac medications before your next appointment, please call your pharmacy*   Follow-Up: At [facility], you and your health needs are our priority.  As part of our continuing mission to provide you with exceptional heart care, we have created designated Provider Care Teams.  These Care Teams include your primary Cardiologist (physician) and Advanced Practice Providers (APPs -  Physician Assistants and Nurse Practitioners) who all work together to provide you with the care you need, when you need it.  We recommend signing up for the patient portal called "MyChart".  Sign up information is provided on this After Visit Summary.  MyChart is used to connect with patients for Virtual Visits (Telemedicine).  Patients are able to view lab/test results, encounter notes, upcoming appointments, etc.  Non-urgent messages can be sent to your provider as well.   To learn more about what you can do with MyChart, go to [email or web address].    Your next appointment:   1 YEAR  The format for your next appointment:   In Person  Provider:   DR. Acie Fredrickson OR AN EXTENDER IN THE OFFICE      HOLD INSTRUCTIONS FOR UPCOMING COLONOSCOPY PER DR. Acie Fredrickson--  Hold her Eliquis for 2 days prior to your colonoscopy.  Resume the Eliquis as soon as it safe from a GI standpoint.  This is usually the day after your colonoscopy.

## 2022-01-10 NOTE — Progress Notes (Signed)
Cardiology Office Note:    Date:  01/10/2022   ID:  STARNISHA BATREZ, DOB 1946-09-16, MRN [id number]  PCP:  [name], MD   [facility] Providers Cardiologist:  Mertie Moores, MD     Referring MD: [name], MD   Chief Complaint  Patient presents with   Atrial Fibrillation    Oct. 11, 2022   [name] is a 76 y.o. female with a hx of back pain,  CKD, HLD, COPD, paroxysmal A. fib.  We are asked to see her today by Dr. Laurann Montana for further evaluation and management of her paroxysmal atrial fibrillation.  She has not yet been started on anticoagulation. CHADS2VASC is 68 ( female, age, HTN)   No hx of CVA, CHF, DM , no vascular disease    Asymptomatic  Cannot tell when her HR is irreg.  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 Laurann Montana indicate a previous GI bleed and colonoscopy with Dr Collene Mares - she does not recall this .   Jan. 17, 2023 : Seen for her PAF  Is due for her colonoscopy   Instructed her to hold eliquis for 2 days  No symptoms that she could say is Afib     Past Medical History:  Diagnosis Date   Arthritis    Rheumatoid arthritis   Celiac disease    Chronic kidney disease    stage 3 from MD notes   COPD (chronic obstructive pulmonary disease) ([place])    Dyspnea    with going up stairs   Family history of adverse reaction to anesthesia    father had hard time waking up   Headache    sinus headaches   Hot flashes    Hypertension    Iron deficiency anemia    Pneumonia    per patient "I have walking pneumonia"    Past Surgical History:  Procedure Laterality Date   COLONOSCOPY     ECTOPIC PREGNANCY SURGERY      x 2   REVERSE SHOULDER ARTHROPLASTY Right 02/02/2017   Procedure: RIGHT REVERSE SHOULDER ARTHROPLASTY;  Surgeon: Netta Cedars, MD;   Location: [place];  Service: Orthopedics;  Laterality: Right;    Current Medications: Current Meds  Medication Sig   acetaminophen (TYLENOL) 500 MG tablet Take 1,000 mg by mouth every 6 (six) hours as needed for mild pain.   apixaban (ELIQUIS) 5 MG TABS tablet Take 1 tablet (5 mg total) by mouth 2 (two) times daily.   Calcium-Magnesium-Vitamin D (CALCIUM 1200+D3 PO) Take 1 tablet by mouth daily.   denosumab (PROLIA) 60 MG/ML SOSY injection Inject 60 mg into the skin every 6 (six) months.   gabapentin (NEURONTIN) 300 MG capsule Take 300 mg by mouth 3 (three) times daily.    InFLIXimab (REMICADE IV) Inject 10 mg into the vein every 8 (eight) weeks.   losartan (COZAAR) 50 MG tablet Take 50 mg by mouth daily.    metoprolol succinate (TOPROL-XL) 50 MG 24 hr tablet Take 50 mg by mouth every morning.    predniSONE (DELTASONE) 5 MG tablet Take 5 mg by mouth daily. TAKE 1 TABLET ORALLY ONCE A DAY FOR 30 DAYS   Sod Picosulfate-Mag Ox-Cit Acd (CLENPIQ) 10-3.5-12 MG-GM -GM/160ML SOLN 160 mL orally  twice for 2   Tiotropium Bromide-Olodaterol (STIOLTO RESPIMAT) 2.5-2.5 MCG/ACT AERS Inhale 2 puffs into the lungs daily.   vitamin C (ASCORBIC ACID) 500 MG tablet Take 500 mg by mouth daily.     Allergies:   Gluten meal, Penicillins, Fosamax [alendronate], and Hydroxychloroquine   Social History   Socioeconomic History   Marital status: Married    Spouse name: Not on file   Number of children: Not on file   Years of education: Not on file   Highest education level: Not on file  Occupational History   Not on file  Tobacco Use   Smoking status: Former    Packs/day: 1.00    Years: 30.00    Pack years: 30.00    Types: Cigarettes    Quit date: 2005    Years since quitting: 18.0   Smokeless tobacco: Never  Vaping Use   Vaping Use: Never used  Substance and Sexual Activity   Alcohol use: Yes    Alcohol/week: 12.0 - 14.0 standard drinks    Types: 12 - 14 Glasses of wine per week   Drug use: No    Sexual activity: Not on file  Other Topics Concern   Not on file  Social History Narrative   Not on file   Social Determinants of Health   Financial Resource Strain: Not on file  Food Insecurity: Not on file  Transportation Needs: Not on file  Physical Activity: Not on file  Stress: Not on file  Social Connections: Not on file     Family History: The patient's family history includes Diabetes in her father; Peripheral Artery Disease in her father.  ROS:   Please see the history of present illness.     All other systems reviewed and are negative.  EKGs/Labs/Other Studies Reviewed:    The following studies were reviewed today: - previous ecgs     Recent Labs: 12/08/2021: ALT 15; BUN 19; Creatinine 1.19; Potassium 4.7; Sodium 137 01/05/2022: Hemoglobin 9.9; Platelets 83  Recent Lipid Panel No results found for: CHOL, TRIG, HDL, CHOLHDL, VLDL, LDLCALC, LDLDIRECT   Risk Assessment/Calculations:    CHA2DS2-VASc Score = 4  } This indicates a 4.8% annual risk of stroke. The patient's score is based upon: CHF History: 0 HTN History: 1 Diabetes History: 0 Stroke History: 0 Vascular Disease History: 0 Age Score: 2 Gender Score: 1          Physical Exam:    Physical Exam: Blood pressure 138/70, pulse 67, height 5' 3"  (1.6 m), weight 127 lb (57.6 kg), SpO2 96 %.  GEN:  Well nourished, well developed in no acute distress HEENT: Normal NECK: No JVD; No carotid bruits LYMPHATICS: No lymphadenopathy CARDIAC: RRR,  occasional premature beats ,  soft systolic murmur , no rubs, gallops RESPIRATORY:  Clear to auscultation without rales, wheezing or rhonchi  ABDOMEN: Soft, non-tender, non-distended MUSCULOSKELETAL:  No edema; No deformity  SKIN: Warm and dry NEUROLOGIC:  Alert and oriented x 3  EKG:    Jan. 17, 2023:   NSR , no ST or T wave changes .  ASSESSMENT:    1. Paroxysmal atrial fibrillation (HCC)     PLAN:      Atrial fib:   has PAF ,  she needs a  colonoscopy soon ,  will have her hold her eliquis for 2 days prior to her colonoscopy.    Will see her in 1 year          Medication Adjustments/Labs and  Tests Ordered: Current medicines are reviewed at length with the patient today.  Concerns regarding medicines are outlined above.  Orders Placed This Encounter  Procedures   EKG 12-Lead    No orders of the defined types were placed in this encounter.    Patient Instructions  Medication Instructions:   Your physician recommends that you continue on your current medications as directed. Please refer to the Current Medication list given to you today.  *If you need a refill on your cardiac medications before your next appointment, please call your pharmacy*   Follow-Up: At [facility], you and your health needs are our priority.  As part of our continuing mission to provide you with exceptional heart care, we have created designated Provider Care Teams.  These Care Teams include your primary Cardiologist (physician) and Advanced Practice Providers (APPs -  Physician Assistants and Nurse Practitioners) who all work together to provide you with the care you need, when you need it.  We recommend signing up for the patient portal called "MyChart".  Sign up information is provided on this After Visit Summary.  MyChart is used to connect with patients for Virtual Visits (Telemedicine).  Patients are able to view lab/test results, encounter notes, upcoming appointments, etc.  Non-urgent messages can be sent to your provider as well.   To learn more about what you can do with MyChart, go to [email or web address].    Your next appointment:   1 YEAR  The format for your next appointment:   In Person  Provider:   DR. Acie Fredrickson OR AN EXTENDER IN THE OFFICE      HOLD INSTRUCTIONS FOR UPCOMING COLONOSCOPY PER DR. Acie Fredrickson--  Hold her Eliquis for 2 days prior to your colonoscopy.  Resume the Eliquis as soon as it safe from a GI  standpoint.  This is usually the day after your colonoscopy.   Signed, Mertie Moores, MD  01/10/2022 1:48 PM    [facility]

## 2022-01-12 ENCOUNTER — Other Ambulatory Visit: Payer: Self-pay

## 2022-01-12 ENCOUNTER — Inpatient Hospital Stay: Payer: Medicare Other | Attending: Nurse Practitioner | Admitting: Hematology

## 2022-01-12 VITALS — BP 150/62 | HR 74 | Temp 98.6°F | Resp 18 | Ht 63.0 in | Wt 126.5 lb

## 2022-01-12 DIAGNOSIS — M069 Rheumatoid arthritis, unspecified: Secondary | ICD-10-CM | POA: Insufficient documentation

## 2022-01-12 DIAGNOSIS — J449 Chronic obstructive pulmonary disease, unspecified: Secondary | ICD-10-CM | POA: Insufficient documentation

## 2022-01-12 DIAGNOSIS — Z96611 Presence of right artificial shoulder joint: Secondary | ICD-10-CM | POA: Insufficient documentation

## 2022-01-12 DIAGNOSIS — C931 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: Secondary | ICD-10-CM | POA: Diagnosis not present

## 2022-01-12 DIAGNOSIS — I10 Essential (primary) hypertension: Secondary | ICD-10-CM | POA: Insufficient documentation

## 2022-01-12 NOTE — Progress Notes (Signed)
[place]   [phone] [phone]   Clinic Follow up Note   Patient Care Team: [name], MD as PCP - General (Family Medicine) [name], Wonda Cheng, MD as PCP - Cardiology (Cardiology) Gavin Pound, MD as Consulting Physician (Rheumatology) Truitt Merle, MD as Consulting Physician (Hematology) Juanita Craver, MD as Consulting Physician (Gastroenterology) Garner Nash, DO as Consulting Physician (Pulmonary Disease)  Date of Service:  01/12/2022  CHIEF COMPLAINT: discuss bone marrow biopsy results  CURRENT THERAPY:  PENDING Azacitadine infusion   ASSESSMENT & PLAN:  [name] is a 75 y.o. female with   1. 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 SPEP and UPEP with immunofixation were negative. No lab evidence of hemolysis, erythropoietin level is normal.  -Her prior bone marrow biopsy in 2016 was negative except several small lymphoid aggregation, suspicious for low-grade B cell lymphoproliferative process, especially SLL. Her peripheral white count has been normal, no elevated lymphocytes -CT scan from 03/2015 was negative for adenopathy or splenomegaly. no B symptoms  -she has been under observation -She developed worsening back pain in Summer 2022 and underwent work up, pelvic MRI 07/04/21 showed a mass in the presacral space. -biopsy of the mass on 11/09/21 showed monoclonal B cell population with co-expression of CD5 comprising 17% of all lymphocytes, and myeloblast population comprising 2%.  -CT CAP on 12/23/21 showed enlargement of presacral mass; pleural nodule of RLL overlying right 10th rib and soft tissue mass overlying right aspect of T10 vertebral body, very slightly enlarged since prior scan in 06/2020. -bone marrow biopsy on 01/05/22 showed hypercellular marrow with features of myelodysplastic/myeloproliferative neoplasm,  increased 12% blasts, most consistent with CMML-2. This is consider high risk CMML -I spoke with pathologist Dr. Gari Crown. Per my request, he reviewed her previous presacral soft tissue biopsy which showed same CMML as her bone marrow biopsy -I reviewed the results and work up with them today. -Given her high risk and CMML and symptomatic presacral mass, I recommend treatment for her CMML. her I discussed different treatment options, including bone marrow transplant (which would be curative), Azacitadine infusion, and injection. I do not think she is a candidate for bone marrow transplant due to her advanced age and multiple medical comorbidities.  I did offer her the referral to bone marrow transplant team at [facility], she will think about it.   -After discussion of indications and side effects, she agrees to proceed with azacitidine infusion. --Chemotherapy consent: Side effects including but does not not limited to, fatigue, nausea, vomiting, diarrhea, hair loss, neuropathy, fluid retention, renal and kidney dysfunction, neutropenic fever, needed for blood transfusion, bleeding, were discussed with patient in great detail. She agrees to proceed. -The goal of therapy is palliative, for disease control and prolong her life. -I recommend port placement to make infusion easier. We will plan to begin treatment in about 2-3 weeks.    2. Rheumatoid arthritis -s/p right shoulder replacement 01/2017 -on Remicade q8 weeks per rheumatologist Dr. Trudie Reed. We reviewed the risk of lymphoma and immunosupression on Remicade -I will reach out to Dr. Trudie Reed to discuss her case. -She takes vitamin supplements and gets prolia injection q9month with her PCP.    3. COPD, AF, HTN -f/u with her PCP and continue medical treatments   PLAN: -Cc note to Dr. HTrudie Reedre: remicade and new dx CMML -chemo education class -port placement -  lab and f/u on 01/30/22 -Azacitadine infusion daily 2/6-2/10 and  2/13-2/14   No problem-specific Assessment & Plan notes found for this encounter.   SUMMARY OF ONCOLOGIC HISTORY: Oncology History  CMML (chronic myelomonocytic leukemia) ([place])  11/09/2021 Initial Biopsy   DIAGNOSIS:   -  Monoclonal B-cell population with co-expression of CD5 comprises 17%  of all lymphocytes  -  See comment   COMMENT:  In addition to the clonal B-cell population, there is a myeloblast  population (CD34, CD38, HLA-DR, CD117, CD123 and CD33) that comprises 2% of the total cellular events.  Please see concurrent tissue biopsy (below) for additional work-up and final diagnosis.    FINAL MICROSCOPIC DIAGNOSIS:   A. SOFT TISSUE MASS, PRE SACRAL, NEEDLE CORE BIOPSY:  -  Chronic lymphocytic leukemia/small lymphocytic lymphoma  -  Extra medullary hematopoiesis  -  See comment   COMMENT:  The biopsy consists of multiple soft tissue cores with lymphoid nodules and a dense hematopoietic infiltrate consistent with extra medullary hematopoiesis.  MPO and E-cadherin highlight myeloid and erythroid precursors respectively.  CD34 highlights increased vasculature and is also positive within the cytoplasm of megakaryocytes.  A few small, immature mononuclear cells appear to be positive for CD34 and CD117. TdT shows rare, scattered positive cells.  CD20 highlights aggregates of B cells which are admixed with CD3 positive T cells.  T cells are an admixture of CD4 and CD8.  The B cells are also positive for CD5, CD23 and Bcl-2.  The B cells do not show significant staining for CD10, BCL6 or cyclin D1.  CD138 highlights scattered plasma cells which are polytypic by kappa and lambda in situ hybridization.  Flow cytometry performed on the sample (see WL [id number]) identified a kappa restricted CD5 positive B-cell population comprising 70% of lymphocytes.  In addition, a small myeloblast population comprised 2% of the total cellular events.   Overall, the findings are consistent with soft  tissue involvement by  chronic lymphocytic leukemia/small lymphocytic lymphoma and extra medullary hematopoiesis. In reviewing the patient's CBC data (macrocytic anemia and thrombocytopenia), I would recommend a bone marrow biopsy to assess for marrow involvement by CLL/SLL.    12/23/2021 Imaging   EXAM: CT CHEST, ABDOMEN, AND PELVIS WITH CONTRAST  IMPRESSION: 1. Slight interval enlargement of a presacral soft tissue mass measuring 7.2 x 4.7 cm, previously 6.9 x 4.1 cm on prior MR of the pelvis dated 07/04/2021. By report, this represents a biopsy proven lymphoma. 2. Pleural nodule of the dependent right lower lobe overlying the posterior right tenth rib and pleural or paraspinous soft tissue mass overlying the right aspect of the T10 vertebral body, very slightly enlarged compared to prior examination of the chest dated 06/25/2020, consistent with additional sites of lymphomatous involvement given very indolent growth. These could be better assessed for metabolic activity by FDG PET/CT if desired. 3. There is mild, bibasilar predominant pulmonary fibrosis in a pattern featuring irregular peripheral interstitial opacity, septal thickening, but without clear evidence of subpleural bronchiolectasis or honeycombing, with a somewhat asymmetric distribution most conspicuously involving the right lower lobe and lingula. These findings are significantly worsened when compared to prior examination dated 06/25/2020, particularly in the right lower lobe. Given interval change, this may reflect sequelae of interval infection or aspiration, however appearance is generally suspicious for fibrotic interstitial lung disease, and if characterized by ATS pulmonary fibrosis is in an "indeterminate for UIP" pattern, differential considerations including both UIP and NSIP. 4. Emphysema.   Aortic Atherosclerosis (ICD10-I70.0) and Emphysema (  ICD10-J43.9).   01/05/2022 Pathology Results   DIAGNOSIS:   BONE  MARROW, ASPIRATE, CLOT, CORE:  -Hypercellular bone marrow for age with features of  myelodysplastic/myeloproliferative neoplasm  -Minor abnormal B-cell population  -See comment   PERIPHERAL BLOOD:  -Macrocytic anemia  -Neutrophilic left shift and monocytosis  -Thrombocytopenia   COMMENT:  The bone marrow is hypercellular for age with dyspoietic changes  involving myeloid cell lines associated with monocytosis and increased number of blastic cells (12%) as primarily seen by morphology, many of which display monocytic features.  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  In this background, there are several predominantly small lymphoid aggregates mostly composed of small lymphoid cells.  By flow cytometry, a minor abnormal B-cell population expressing CD5 is seen and representing 2% of all cells.  This correlate with previously known B-cell lymphoproliferative process.  Correlation with cytogenetic and FISH studies is strongly recommended.    DIAGNOSIS:   -Increased number of monocytic cells present (25%)  -Minor abnormal B-cell population identified.  -See comment   COMMENT:  Flow cytometric analysis shows increased number of monocytic cells representing 25% of all cells but without aberrant phenotype or CD34 expression.  A significant CD34-positive blastic population is not identified.  The lymphoid population shows a minor B-cell population representing 2% of all cells and expressing B-cell antigens including CD20 associated with CD5, CD200 and possibly dim kappa expression.  The latter findings are abnormal and correlate with previously known B-cell lymphoproliferative process.  No significant T-cell phenotypic abnormalities identified.    01/12/2022 Initial Diagnosis   CMML (chronic myelomonocytic leukemia)  ([place])   01/12/2022 Cancer Staging   Staging form: Chronic Myeloid Leukemia, AJCC 8th Edition - Clinical stage from 01/12/2022: Bone marrow blast count (%): 12, Additional clonal changes: Unknown - Signed by Truitt Merle, MD on 01/12/2022 Stage prefix: Initial diagnosis       INTERVAL HISTORY:  [name] is here for a follow up of CMML. She was last seen by me on 12/08/21 with NP Lacie. She presents to the clinic accompanied by her husband. She reports pain to her back and left leg.   All other systems were reviewed with the patient and are negative.  MEDICAL HISTORY:  Past Medical History:  Diagnosis Date   Arthritis    Rheumatoid arthritis   Celiac disease    Chronic kidney disease    stage 3 from MD notes   COPD (chronic obstructive pulmonary disease) ([place])    Dyspnea    with going up stairs   Family history of adverse reaction to anesthesia    father had hard time waking up   Headache    sinus headaches   Hot flashes    Hypertension    Iron deficiency anemia    Pneumonia    per patient "I have walking pneumonia"    SURGICAL HISTORY: Past Surgical History:  Procedure Laterality Date   COLONOSCOPY     ECTOPIC PREGNANCY SURGERY      x 2   REVERSE SHOULDER ARTHROPLASTY Right 02/02/2017   Procedure: RIGHT REVERSE SHOULDER ARTHROPLASTY;  Surgeon: Netta Cedars, MD;  Location: [place];  Service: Orthopedics;  Laterality: Right;    I have reviewed the social history and family history with the patient and they are unchanged from previous note.  ALLERGIES:  is allergic to gluten meal, penicillins, fosamax [alendronate], and hydroxychloroquine.  MEDICATIONS:  Current Outpatient Medications  Medication Sig Dispense Refill   acetaminophen (TYLENOL) 500 MG tablet Take 1,000 mg by mouth every 6 (six) hours as needed for mild pain.     apixaban (ELIQUIS) 5 MG TABS tablet Take 1 tablet (5 mg total) by mouth 2 (two) times daily. 60 tablet 11   Calcium-Magnesium-Vitamin D  (CALCIUM 1200+D3 PO) Take 1 tablet by mouth daily.     denosumab (PROLIA) 60 MG/ML SOSY injection Inject 60 mg into the skin every 6 (six) months.     gabapentin (NEURONTIN) 300 MG capsule Take 300 mg by mouth 3 (three) times daily.      InFLIXimab (REMICADE IV) Inject 10 mg into the vein every 8 (eight) weeks.     losartan (COZAAR) 50 MG tablet Take 50 mg by mouth daily.      metoprolol succinate (TOPROL-XL) 50 MG 24 hr tablet Take 50 mg by mouth every morning.      predniSONE (DELTASONE) 5 MG tablet Take 5 mg by mouth daily. TAKE 1 TABLET ORALLY ONCE A DAY FOR 30 DAYS     Sod Picosulfate-Mag Ox-Cit Acd (CLENPIQ) 10-3.5-12 MG-GM -GM/160ML SOLN 160 mL orally twice for 2     Tiotropium Bromide-Olodaterol (STIOLTO RESPIMAT) 2.5-2.5 MCG/ACT AERS Inhale 2 puffs into the lungs daily. 4 g 11   vitamin C (ASCORBIC ACID) 500 MG tablet Take 500 mg by mouth daily.     No current facility-administered medications for this visit.    PHYSICAL EXAMINATION: ECOG PERFORMANCE STATUS: 3 - Symptomatic, >50% confined to bed  Vitals:   01/12/22 1415  BP: (!) 150/62  Pulse: 74  Resp: 18  Temp: 98.6 F (37 C)  SpO2: 96%   Wt Readings from Last 3 Encounters:  01/12/22 126 lb 8 oz (57.4 kg)  01/10/22 127 lb (57.6 kg)  12/08/21 126 lb 9.6 oz (57.4 kg)     GENERAL:alert, no distress and comfortable SKIN: skin color normal, no rashes or significant lesions EYES: normal, Conjunctiva are pink and non-injected, sclera clear  NEURO: alert & oriented x 3 with fluent speech  LABORATORY DATA:  I have reviewed the data as listed CBC Latest Ref Rng & Units 01/05/2022 12/08/2021 11/09/2021  WBC 4.0 - 10.5 K/uL 10.5 8.9 7.1  Hemoglobin 12.0 - 15.0 g/dL 9.9(L) 10.3(L) 8.4(L)  Hematocrit 36.0 - 46.0 % 30.6(L) 31.9(L) 28.0(L)  Platelets 150 - 400 K/uL 83(L) 80(L) 76(L)     CMP Latest Ref Rng & Units 12/08/2021 07/20/2021 01/20/2021  Glucose 70 - 99 mg/dL 212(H) 93 111(H)  BUN 8 - 23 mg/dL 19 19 20   Creatinine  0.44 - 1.00 mg/dL 1.19(H) 1.10(H) 1.13(H)  Sodium 135 - 145 mmol/L 137 134(L) 137  Potassium 3.5 - 5.1 mmol/L 4.7 4.1 4.5  Chloride 98 - 111 mmol/L 103 101 101  CO2 22 - 32 mmol/L 21(L) 26 27  Calcium 8.9 - 10.3 mg/dL 9.6 9.1 10.0  Total Protein 6.5 - 8.1 g/dL 7.1 6.0(L) 7.0  Total Bilirubin 0.3 - 1.2 mg/dL 0.6 0.9 0.9  Alkaline Phos 38 - 126 U/L 49 42 46  AST 15 - 41 U/L 21 25 23   ALT 0 - 44 U/L 15 15 19       RADIOGRAPHIC STUDIES: I have personally reviewed the radiological images as listed and agreed with the findings in the report. No results found.    No orders of the defined types were placed in this encounter.  All questions were answered. The patient knows to call the  clinic with any problems, questions or concerns. No barriers to learning was detected. The total time spent in the appointment was 50 minutes.     Truitt Merle, MD 01/12/2022   I, Wilburn Mylar, am acting as scribe for Truitt Merle, MD.   I have reviewed the above documentation for accuracy and completeness, and I agree with the above.

## 2022-01-13 ENCOUNTER — Encounter (HOSPITAL_COMMUNITY): Payer: Self-pay | Admitting: Hematology

## 2022-01-13 ENCOUNTER — Other Ambulatory Visit: Payer: Self-pay

## 2022-01-15 ENCOUNTER — Encounter: Payer: Self-pay | Admitting: Hematology

## 2022-01-15 MED ORDER — ONDANSETRON HCL 8 MG PO TABS: 8.0000 mg | ORAL_TABLET | Freq: Two times a day (BID) | ORAL | 1 refills | Status: DC | PRN

## 2022-01-15 MED ORDER — LIDOCAINE-PRILOCAINE 2.5-2.5 % EX CREA: TOPICAL_CREAM | CUTANEOUS | 3 refills | Status: DC

## 2022-01-15 MED ORDER — PROCHLORPERAZINE MALEATE 10 MG PO TABS: 10.0000 mg | ORAL_TABLET | Freq: Four times a day (QID) | ORAL | 1 refills | Status: DC | PRN

## 2022-01-15 NOTE — Progress Notes (Signed)
START OFF PATHWAY REGIMEN - Other   [id number]:Azacitidine 75 mg/m2 IV D1-7 q28 Days:   A cycle is every 28 days:     Azacitidine   **Always confirm dose/schedule in your pharmacy ordering system**  Patient Characteristics: Intent of Therapy: Non-Curative / Palliative Intent, Discussed with Patient

## 2022-01-15 NOTE — Progress Notes (Signed)
Patient on plan of care prior to pathways. 

## 2022-01-16 ENCOUNTER — Telehealth: Payer: Self-pay | Admitting: Hematology

## 2022-01-16 NOTE — Telephone Encounter (Signed)
Scheduled follow-up appointments per 1/19 los. Patient is aware.

## 2022-01-17 ENCOUNTER — Encounter (HOSPITAL_COMMUNITY): Payer: Self-pay | Admitting: Hematology

## 2022-01-17 DIAGNOSIS — M25562 Pain in left knee: Secondary | ICD-10-CM | POA: Diagnosis not present

## 2022-01-18 ENCOUNTER — Other Ambulatory Visit: Payer: Self-pay

## 2022-01-18 NOTE — Progress Notes (Signed)
Contacted Dr. Gavin Pound @[facility] [phone] regarding any contraindications for pt starting Azacitadine Injections when the pt is currently taking Remicade.  Awaiting Dr. Trudie Reed response.  Faxed last office note and pathology report to Dr. Doristine Devoid @[facility]  [place] T: [phone]  F: [phone] for 2nd opinion.

## 2022-01-19 ENCOUNTER — Other Ambulatory Visit: Payer: Self-pay

## 2022-01-19 ENCOUNTER — Other Ambulatory Visit: Payer: Self-pay | Admitting: Hematology

## 2022-01-19 ENCOUNTER — Inpatient Hospital Stay: Payer: Medicare Other

## 2022-01-19 DIAGNOSIS — C931 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: Secondary | ICD-10-CM

## 2022-01-19 NOTE — Progress Notes (Signed)
Contacted Dr. Gavin Pound @[email or web address] [phone]. Dr. Trudie Reed stated the Remicade would be the best option for the pt to remain on with Azacitidine.  Dr. Trudie Reed stated Remicade does not cause bone marrow suppression and does not effect LFTs & RFTs.  Dr. Trudie Reed stated she does not recommend any of the older immunosuppressive medications like Methotrexate or Humira etc. D/t their increased effects on bone marrow suppression, LFTs and RFTs.  Dr. Trudie Reed stated that if Dr. Burr Medico feel that she would like the pt to remain off Remicade, Dr. Trudie Reed would have to increase the pt's steroid dose (Prednisone) which Dr. Trudie Reed really does not feel is the best options d/t side effects of high dose prednisone use.   Informed Dr. Burr Medico of Dr. Trudie Reed recommendation.  Dr. Burr Medico stated based on Dr. Trudie Reed recommendation pt will continue on Remicade and start Azacitidine as planned.  Contacted Dr. Trudie Reed office to inform Dr. Trudie Reed that Dr. Burr Medico is in agreement with their treatment plan; therefore, pt can receive Remicade infusion scheduled for 01/20/2022.

## 2022-01-23 ENCOUNTER — Other Ambulatory Visit: Payer: Medicare Other

## 2022-01-23 ENCOUNTER — Telehealth: Payer: Medicare Other | Admitting: Nurse Practitioner

## 2022-01-23 ENCOUNTER — Ambulatory Visit: Payer: Medicare Other | Admitting: Nurse Practitioner

## 2022-01-23 DIAGNOSIS — C931 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: Secondary | ICD-10-CM | POA: Diagnosis not present

## 2022-01-25 DIAGNOSIS — Z1211 Encounter for screening for malignant neoplasm of colon: Secondary | ICD-10-CM | POA: Diagnosis not present

## 2022-01-25 DIAGNOSIS — K625 Hemorrhage of anus and rectum: Secondary | ICD-10-CM | POA: Diagnosis not present

## 2022-01-25 DIAGNOSIS — K5730 Diverticulosis of large intestine without perforation or abscess without bleeding: Secondary | ICD-10-CM | POA: Diagnosis not present

## 2022-01-25 NOTE — Progress Notes (Signed)
Pharmacist Chemotherapy Monitoring - Initial Assessment    Anticipated start date: 02/01/22   The following has been reviewed per standard work regarding the patient's treatment regimen: The patient's diagnosis, treatment plan and drug doses, and organ/hematologic function Lab orders and baseline tests specific to treatment regimen  The treatment plan start date, drug sequencing, and pre-medications Prior authorization status  Patient's documented medication list, including drug-drug interaction screen and prescriptions for anti-emetics and supportive care specific to the treatment regimen The drug concentrations, fluid compatibility, administration routes, and timing of the medications to be used The patient's access for treatment and lifetime cumulative dose history, if applicable  The patient's medication allergies and previous infusion related reactions, if applicable   Changes made to treatment plan:  N/A  Follow up needed:  Pending authorization for treatment     Philomena Course, RPH, 01/25/2022  1:32 PM

## 2022-01-26 ENCOUNTER — Other Ambulatory Visit: Payer: Self-pay | Admitting: Gastroenterology

## 2022-01-26 ENCOUNTER — Telehealth: Payer: Self-pay

## 2022-01-26 DIAGNOSIS — K5790 Diverticulosis of intestine, part unspecified, without perforation or abscess without bleeding: Secondary | ICD-10-CM

## 2022-01-26 DIAGNOSIS — Q438 Other specified congenital malformations of intestine: Secondary | ICD-10-CM

## 2022-01-26 DIAGNOSIS — C931 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: Secondary | ICD-10-CM | POA: Diagnosis not present

## 2022-01-26 DIAGNOSIS — M069 Rheumatoid arthritis, unspecified: Secondary | ICD-10-CM | POA: Diagnosis not present

## 2022-01-26 DIAGNOSIS — M0579 Rheumatoid arthritis with rheumatoid factor of multiple sites without organ or systems involvement: Secondary | ICD-10-CM | POA: Diagnosis not present

## 2022-01-26 NOTE — Telephone Encounter (Signed)
This nurse reached out to patient related to appointment schedule. Reason for appointment change is due to providers deciding to hold treatment for one week.  Left a message to return call if there are any concerns and questions.  No further questions or concerns at this time.

## 2022-01-30 ENCOUNTER — Inpatient Hospital Stay: Payer: Medicare Other

## 2022-01-30 ENCOUNTER — Inpatient Hospital Stay: Payer: Medicare Other | Admitting: Hematology

## 2022-01-30 ENCOUNTER — Encounter: Payer: Self-pay | Admitting: Hematology

## 2022-01-30 ENCOUNTER — Other Ambulatory Visit: Payer: Self-pay | Admitting: Student

## 2022-01-30 NOTE — Progress Notes (Signed)
Called pt to introduce myself as her Arboriculturist.  Pt has 2 insurances so copay assistance isn't needed.  I informed her of the [employer] and went over what it covers but she declined it wanting to leave it for someone in greater need.  I will request for the registration staff leave her my card in case she changes her mind in the future.

## 2022-01-31 ENCOUNTER — Inpatient Hospital Stay: Payer: Medicare Other

## 2022-01-31 ENCOUNTER — Encounter (HOSPITAL_COMMUNITY): Payer: Self-pay

## 2022-01-31 ENCOUNTER — Ambulatory Visit (HOSPITAL_COMMUNITY)
Admission: RE | Admit: 2022-01-31 | Discharge: 2022-01-31 | Disposition: A | Discharge status: Home / Self Care | Payer: Medicare Other | Source: Ambulatory Visit | Attending: Hematology | Admitting: Hematology

## 2022-01-31 ENCOUNTER — Other Ambulatory Visit: Payer: Self-pay

## 2022-01-31 ENCOUNTER — Other Ambulatory Visit: Payer: Medicare Other

## 2022-01-31 ENCOUNTER — Ambulatory Visit: Payer: Medicare Other

## 2022-01-31 ENCOUNTER — Inpatient Hospital Stay: Payer: Medicare Other | Attending: Nurse Practitioner

## 2022-01-31 DIAGNOSIS — D649 Anemia, unspecified: Secondary | ICD-10-CM | POA: Insufficient documentation

## 2022-01-31 DIAGNOSIS — K900 Celiac disease: Secondary | ICD-10-CM | POA: Diagnosis not present

## 2022-01-31 DIAGNOSIS — Z5111 Encounter for antineoplastic chemotherapy: Secondary | ICD-10-CM | POA: Insufficient documentation

## 2022-01-31 DIAGNOSIS — M069 Rheumatoid arthritis, unspecified: Secondary | ICD-10-CM | POA: Insufficient documentation

## 2022-01-31 DIAGNOSIS — J449 Chronic obstructive pulmonary disease, unspecified: Secondary | ICD-10-CM | POA: Diagnosis not present

## 2022-01-31 DIAGNOSIS — C8590 Non-Hodgkin lymphoma, unspecified, unspecified site: Secondary | ICD-10-CM | POA: Diagnosis not present

## 2022-01-31 DIAGNOSIS — I129 Hypertensive chronic kidney disease with stage 1 through stage 4 chronic kidney disease, or unspecified chronic kidney disease: Secondary | ICD-10-CM | POA: Diagnosis not present

## 2022-01-31 DIAGNOSIS — C931 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: Secondary | ICD-10-CM | POA: Diagnosis not present

## 2022-01-31 DIAGNOSIS — N183 Chronic kidney disease, stage 3 unspecified: Secondary | ICD-10-CM | POA: Insufficient documentation

## 2022-01-31 DIAGNOSIS — I480 Paroxysmal atrial fibrillation: Secondary | ICD-10-CM | POA: Diagnosis not present

## 2022-01-31 DIAGNOSIS — D696 Thrombocytopenia, unspecified: Secondary | ICD-10-CM | POA: Insufficient documentation

## 2022-01-31 DIAGNOSIS — Z452 Encounter for adjustment and management of vascular access device: Secondary | ICD-10-CM | POA: Diagnosis not present

## 2022-01-31 HISTORY — PX: IR IMAGING GUIDED PORT INSERTION: IMG5740

## 2022-01-31 MED ORDER — FENTANYL CITRATE (PF) 100 MCG/2ML IJ SOLN
INTRAMUSCULAR | Status: AC | PRN
  Administered 2022-01-31: 25 ug via INTRAVENOUS

## 2022-01-31 MED ORDER — LIDOCAINE HCL 1 % IJ SOLN
INTRAMUSCULAR | Status: AC
  Filled 2022-01-31: qty 20

## 2022-01-31 MED ORDER — MIDAZOLAM HCL 2 MG/2ML IJ SOLN
INTRAMUSCULAR | Status: AC
  Filled 2022-01-31: qty 2

## 2022-01-31 MED ORDER — FENTANYL CITRATE (PF) 100 MCG/2ML IJ SOLN
INTRAMUSCULAR | Status: AC
  Filled 2022-01-31: qty 2

## 2022-01-31 MED ORDER — MIDAZOLAM HCL 2 MG/2ML IJ SOLN
INTRAMUSCULAR | Status: AC | PRN
  Administered 2022-01-31: 1 mg via INTRAVENOUS

## 2022-01-31 MED ORDER — LIDOCAINE-EPINEPHRINE 1 %-1:100000 IJ SOLN
INTRAMUSCULAR | Status: AC
  Filled 2022-01-31: qty 1

## 2022-01-31 MED ORDER — HEPARIN SOD (PORK) LOCK FLUSH 100 UNIT/ML IV SOLN
INTRAVENOUS | Status: AC | PRN
  Administered 2022-01-31: 500 [IU] via INTRAVENOUS

## 2022-01-31 MED ORDER — LIDOCAINE-EPINEPHRINE 1 %-1:100000 IJ SOLN
INTRAMUSCULAR | Status: AC | PRN
  Administered 2022-01-31: 10 mL via INTRADERMAL

## 2022-01-31 MED ORDER — FENTANYL CITRATE (PF) 100 MCG/2ML IJ SOLN
INTRAMUSCULAR | Status: AC | PRN
  Administered 2022-01-31: 50 ug via INTRAVENOUS

## 2022-01-31 MED ORDER — HEPARIN SOD (PORK) LOCK FLUSH 100 UNIT/ML IV SOLN
INTRAVENOUS | Status: AC
  Filled 2022-01-31: qty 5

## 2022-01-31 MED ORDER — SODIUM CHLORIDE 0.9 % IV SOLN: INTRAVENOUS | Status: DC

## 2022-01-31 NOTE — H&P (Signed)
Referring Physician(s): Feng,Yan  Supervising Physician: Mir, Sharen Heck  Patient Status:  [facility]  Chief Complaint:  "I'm getting a port a cath"  Subjective: Patient familiar to IR service from bone marrow biopsy in 2016, presacral pelvic mass biopsy in 2022 and bone marrow biopsy on 01/05/2022.  She has a history of newly diagnosed CMML and presents today for Port-A-Cath placement to assist with treatment.  She currently denies fever, headache, chest pain, dyspnea, cough, abdominal pain, nausea, vomiting or bleeding.  She does have chronic back pain.  Additional medical history as below.  Past Medical History:  Diagnosis Date   Arthritis    Rheumatoid arthritis   Celiac disease    Chronic kidney disease    stage 3 from MD notes   COPD (chronic obstructive pulmonary disease) ([place])    Dyspnea    with going up stairs   Family history of adverse reaction to anesthesia    father had hard time waking up   Headache    sinus headaches   Hot flashes    Hypertension    Iron deficiency anemia    Pneumonia    per patient "I have walking pneumonia"   Past Surgical History:  Procedure Laterality Date   COLONOSCOPY     ECTOPIC PREGNANCY SURGERY      x 2   REVERSE SHOULDER ARTHROPLASTY Right 02/02/2017   Procedure: RIGHT REVERSE SHOULDER ARTHROPLASTY;  Surgeon: Netta Cedars, MD;  Location: [facility];  Service: Orthopedics;  Laterality: Right;      Allergies: Gluten meal, Penicillins, Fosamax [alendronate], and Hydroxychloroquine  Medications: Prior to Admission medications   Medication Sig Start Date End Date Taking? Authorizing Provider  acetaminophen (TYLENOL) 500 MG tablet Take 1,000 mg by mouth every 6 (six) hours as needed for mild pain.   Yes [provider]  apixaban (ELIQUIS) 5 MG TABS tablet Take 1 tablet (5 mg total) by mouth 2 (two) times daily. 10/04/21  Yes [name], Wonda Cheng, MD  Calcium-Magnesium-Vitamin D (CALCIUM 1200+D3 PO) Take 1 tablet by mouth daily.   Yes  [provider]  gabapentin (NEURONTIN) 300 MG capsule Take 300 mg by mouth 3 (three) times daily.  07/21/14  Yes [provider]  InFLIXimab (REMICADE IV) Inject 10 mg into the vein every 8 (eight) weeks.   Yes [provider]  losartan (COZAAR) 50 MG tablet Take 50 mg by mouth daily.  05/20/19  Yes [provider]  metoprolol succinate (TOPROL-XL) 50 MG 24 hr tablet Take 50 mg by mouth every morning.  07/21/14  Yes [provider]  predniSONE (DELTASONE) 5 MG tablet Take 5 mg by mouth daily. TAKE 1 TABLET ORALLY ONCE A DAY FOR 30 DAYS 12/12/21  Yes Gavin Pound, MD  Sod Picosulfate-Mag Ox-Cit Acd [facility]) 10-3.5-12 MG-GM -GM/160ML SOLN 160 mL orally twice for 2 01/06/22  Yes [provider]  Tiotropium Bromide-Olodaterol (STIOLTO RESPIMAT) 2.5-2.5 MCG/ACT AERS Inhale 2 puffs into the lungs daily. 05/03/21  Yes Martyn Ehrich, NP  denosumab (PROLIA) 60 MG/ML SOSY injection Inject 60 mg into the skin every 6 (six) months.    [provider]  lidocaine-prilocaine (EMLA) cream Apply to affected area once 01/15/22   Truitt Merle, MD  ondansetron (ZOFRAN) 8 MG tablet Take 1 tablet (8 mg total) by mouth 2 (two) times daily as needed for refractory nausea / vomiting. Start on the third day after last day of chemotherapy. 01/15/22   Truitt Merle, MD  prochlorperazine (COMPAZINE) 10 MG  tablet Take 1 tablet (10 mg total) by mouth every 6 (six) hours as needed (Nausea or vomiting). 01/15/22   Truitt Merle, MD  vitamin C (ASCORBIC ACID) 500 MG tablet Take 500 mg by mouth daily.    [provider]     Vital Signs: BP (!) 140/103    Pulse 97    Temp 98.4 F (36.9 C) (Oral)    Resp 18    Ht _0  (1.6 m)    Wt 126 lb 8 oz (57.4 kg)    SpO2 96%    BMI 22.41 kg/m   Physical Exam awake, alert.  Chest clear to auscultation bilaterally.  Heart with irregularly irregular rhythm,  soft murmur; abdomen soft, positive bowel sounds, nontender. Ext with  FROM.  Imaging: No results found.  Labs:  CBC: Recent Labs    07/20/21 1241 11/09/21 0927 12/08/21 1029 01/05/22 0839  WBC 6.8 7.1 8.9 10.5  HGB 9.3* 8.4* 10.3* 9.9*  HCT 29.6* 28.0* 31.9* 30.6*  PLT 81* 76* 80* 83*    COAGS: Recent Labs    07/20/21 0903 11/09/21 0927  INR 1.1 1.1    BMP: Recent Labs    07/20/21 1241 12/08/21 1029  NA 134* 137  K 4.1 4.7  CL 101 103  CO2 26 21*  GLUCOSE 93 212*  BUN 19 19  CALCIUM 9.1 9.6  CREATININE 1.10* 1.19*  GFRNONAA 53* 48*    LIVER FUNCTION TESTS: Recent Labs    07/20/21 1241 12/08/21 1029  BILITOT 0.9 0.6  AST 25 21  ALT 15 15  ALKPHOS 42 49  PROT 6.0* 7.1  ALBUMIN 3.4* 4.3    Assessment and Plan: Patient familiar to IR service from bone marrow biopsy in 2016, presacral pelvic mass biopsy in 2022 and bone marrow biopsy on 01/05/2022.  She has a history of newly diagnosed CMML and presents today for Port-A-Cath placement to assist with treatment. PMH also sig for PAF, CKD, celiac dz, COPD, HTN,anemia,RA. Risks and benefits of image guided port-a-catheter placement was discussed with the patient including, but not limited to bleeding, infection, pneumothorax, or fibrin sheath development and need for additional procedures.  All of the patient's questions were answered, patient is agreeable to proceed. Consent signed and in chart.    Electronically Signed: D. Rowe Robert, PA-C 01/31/2022, 12:51 PM   I spent a total of 25 Minutes at the the patient's bedside AND on the patient's hospital floor or unit, greater than 50% of which was counseling/coordinating care for port a cath placement

## 2022-01-31 NOTE — Procedures (Signed)
Interventional Radiology Procedure Note  Procedure: Chest port  Indication: CML  Findings: Please refer to procedural dictation for full description.  Complications: None  EBL: < 10 mL  Miachel Roux, MD [phone]

## 2022-01-31 NOTE — Discharge Instructions (Signed)
Interventional radiology phone numbers [phone] After hours [phone]    You have skin glue (dermabond) over your new port. Do not use the lidocaine cream (EMLA cream) over the skin glue until it has healed. The petroleum in the lidocaine cream will dissolve the skin glue resulting in an infection of your new port. Use ice in a zip lock bag for 1-2 minutes over your new port before the cancer center nurses access your port.   Implanted Port Insertion, Care After This sheet gives you information about how to care for yourself after your procedure. Your health care provider may also give you more specific instructions. If you have problems or questions, contact your health care provider. What can I expect after the procedure? After the procedure, it is common to have: Discomfort at the port insertion site. Bruising on the skin over the port. This should improve over 3-4 days. Follow these instructions at home: [facility], The care After your port is placed, you will get a manufacturer's information card. The card has information about your port. Keep this card with you at all times. Take care of the port as told by your health care provider. Ask your health care provider if you or a family member can get training for taking care of the port at home. A home health care nurse may also take care of the port. Make sure to remember what type of port you have. Incision care Follow instructions from your health care provider about how to take care of your port insertion site. Make sure you: Wash your hands with soap and water before and after you change your bandage (dressing). If soap and water are not available, use hand sanitizer. Change your dressing as told by your health care provider. Leave skin glue in place. These skin closures may need to stay in place for 2 weeks or longer.  Check your port insertion site every day for signs of infection. Check for: Redness, swelling, or pain. Fluid or  blood. Warmth. Pus or a bad smell.      Activity Return to your normal activities as told by your health care provider. Ask your health care provider what activities are safe for you. Do not lift anything that is heavier than 10 lb (4.5 kg), or the limit that you are told, until your health care provider says that it is safe. General instructions Take over-the-counter and prescription medicines only as told by your health care provider. Do not take baths, swim, or use a hot tub until your health care provider approves.You may remove your dressing tomorrow and shower 24 hours after your procedure. Do not drive for 24 hours if you were given a sedative during your procedure. Wear a medical alert bracelet in case of an emergency. This will tell any health care providers that you have a port. Keep all follow-up visits as told by your health care provider. This is important. Contact a health care provider if: You cannot flush your port with saline as directed, or you cannot draw blood from the port. You have a fever or chills. You have redness, swelling, or pain around your port insertion site. You have fluid or blood coming from your port insertion site. Your port insertion site feels warm to the touch. You have pus or a bad smell coming from the port insertion site. Get help right away if: You have chest pain or shortness of breath. You have bleeding from your port that you cannot control. Summary Take care of  the port as told by your health care provider. Keep the manufacturer's information card with you at all times. Change your dressing as told by your health care provider. Contact a health care provider if you have a fever or chills or if you have redness, swelling, or pain around your port insertion site. Keep all follow-up visits as told by your health care provider. This information is not intended to replace advice given to you by your health care provider. Make sure you discuss any  questions you have with your health care provider. Document Revised: 07/09/2018 Document Reviewed: 07/09/2018 Elsevier Patient Education  2021 [place].    Moderate Conscious Sedation, Adult, Care After This sheet gives you information about how to care for yourself after your procedure. Your health care provider may also give you more specific instructions. If you have problems or questions, contact your health care provider. What can I expect after the procedure? After the procedure, it is common to have: Sleepiness for several hours. Impaired judgment for several hours. Difficulty with balance. Vomiting if you eat too soon. Follow these instructions at home: For the time period you were told by your health care provider: Rest. Do not participate in activities where you could fall or become injured. Do not drive or use machinery. Do not drink alcohol. Do not take sleeping pills or medicines that cause drowsiness. Do not make important decisions or sign legal documents. Do not take care of children on your own.      Eating and drinking Follow the diet recommended by your health care provider. Drink enough fluid to keep your urine pale yellow. If you vomit: Drink water, juice, or soup when you can drink without vomiting. Make sure you have little or no nausea before eating solid foods.   General instructions Take over-the-counter and prescription medicines only as told by your health care provider. Have a responsible adult stay with you for the time you are told. It is important to have someone help care for you until you are awake and alert. Do not smoke. Keep all follow-up visits as told by your health care provider. This is important. Contact a health care provider if: You are still sleepy or having trouble with balance after 24 hours. You feel light-headed. You keep feeling nauseous or you keep vomiting. You develop a rash. You have a fever. You have redness or  swelling around the IV site. Get help right away if: You have trouble breathing. You have new-onset confusion at home. Summary After the procedure, it is common to feel sleepy, have impaired judgment, or feel nauseous if you eat too soon. Rest after you get home. Know the things you should not do after the procedure. Follow the diet recommended by your health care provider and drink enough fluid to keep your urine pale yellow. Get help right away if you have trouble breathing or new-onset confusion at home. This information is not intended to replace advice given to you by your health care provider. Make sure you discuss any questions you have with your health care provider. Document Revised: 04/09/2020 Document Reviewed: 11/06/2019 Elsevier Patient Education  2021 [employer].

## 2022-02-01 ENCOUNTER — Inpatient Hospital Stay: Payer: Medicare Other

## 2022-02-01 DIAGNOSIS — C931 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: Secondary | ICD-10-CM | POA: Diagnosis not present

## 2022-02-02 ENCOUNTER — Inpatient Hospital Stay: Payer: Medicare Other

## 2022-02-03 ENCOUNTER — Inpatient Hospital Stay: Payer: Medicare Other

## 2022-02-03 ENCOUNTER — Telehealth: Payer: Self-pay | Admitting: Student

## 2022-02-03 MED FILL — Dexamethasone Sodium Phosphate Inj 100 MG/10ML: INTRAMUSCULAR | Qty: 1 | Status: AC

## 2022-02-03 NOTE — Telephone Encounter (Signed)
Message received that patient's husband had questions regarding port-a catheter.   Called husband, no answer.  Please call radiology for questions and concerns.   Tera Mater PA-C 02/03/2022 12:35 PM

## 2022-02-06 ENCOUNTER — Inpatient Hospital Stay: Payer: Medicare Other

## 2022-02-06 ENCOUNTER — Encounter: Payer: Self-pay | Admitting: Hematology

## 2022-02-06 ENCOUNTER — Inpatient Hospital Stay (HOSPITAL_BASED_OUTPATIENT_CLINIC_OR_DEPARTMENT_OTHER): Payer: Medicare Other | Admitting: Hematology

## 2022-02-06 ENCOUNTER — Other Ambulatory Visit: Payer: Self-pay

## 2022-02-06 ENCOUNTER — Encounter (HOSPITAL_COMMUNITY): Payer: Self-pay | Admitting: Hematology

## 2022-02-06 VITALS — BP 165/73 | HR 86 | Temp 97.9°F | Resp 18 | Ht 63.0 in | Wt 126.2 lb

## 2022-02-06 DIAGNOSIS — C931 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: Secondary | ICD-10-CM

## 2022-02-06 DIAGNOSIS — Z95828 Presence of other vascular implants and grafts: Secondary | ICD-10-CM | POA: Insufficient documentation

## 2022-02-06 DIAGNOSIS — D649 Anemia, unspecified: Secondary | ICD-10-CM | POA: Diagnosis not present

## 2022-02-06 DIAGNOSIS — D696 Thrombocytopenia, unspecified: Secondary | ICD-10-CM | POA: Diagnosis not present

## 2022-02-06 DIAGNOSIS — Z5111 Encounter for antineoplastic chemotherapy: Secondary | ICD-10-CM | POA: Diagnosis not present

## 2022-02-06 LAB — COMPREHENSIVE METABOLIC PANEL
ALT: 14 U/L (ref 0–44)
AST: 20 U/L (ref 15–41)
Albumin: 4.4 g/dL (ref 3.5–5.0)
Alkaline Phosphatase: 43 U/L (ref 38–126)
Anion gap: 10 (ref 5–15)
BUN: 24 mg/dL — ABNORMAL HIGH (ref 8–23)
CO2: 25 mmol/L (ref 22–32)
Calcium: 9.7 mg/dL (ref 8.9–10.3)
Chloride: 101 mmol/L (ref 98–111)
Creatinine, Ser: 0.98 mg/dL (ref 0.44–1.00)
GFR, Estimated: >60 mL/min (ref >60)
Glucose, Bld: 115 mg/dL — ABNORMAL HIGH (ref 70–99)
Potassium: 4.3 mmol/L (ref 3.5–5.1)
Sodium: 136 mmol/L (ref 135–145)
Total Bilirubin: 0.9 mg/dL (ref 0.3–1.2)
Total Protein: 6.6 g/dL (ref 6.5–8.1)

## 2022-02-06 LAB — CBC WITH DIFFERENTIAL/PLATELET
Abs Immature Granulocytes: 0.07 10*3/uL (ref 0.00–0.07)
Basophils Absolute: 0 10*3/uL (ref 0.0–0.1)
Basophils Relative: 0 %
Eosinophils Absolute: 0 10*3/uL (ref 0.0–0.5)
Eosinophils Relative: 0 %
HCT: 26.9 % — ABNORMAL LOW (ref 36.0–46.0)
Hemoglobin: 8.5 g/dL — ABNORMAL LOW (ref 12.0–15.0)
Immature Granulocytes: 1 %
Lymphocytes Relative: 22 %
Lymphs Abs: 1.6 10*3/uL (ref 0.7–4.0)
MCH: 35.3 pg — ABNORMAL HIGH (ref 26.0–34.0)
MCHC: 31.6 g/dL (ref 30.0–36.0)
MCV: 111.6 fL — ABNORMAL HIGH (ref 80.0–100.0)
Monocytes Absolute: 1.5 10*3/uL — ABNORMAL HIGH (ref 0.1–1.0)
Monocytes Relative: 21 %
Neutro Abs: 3.9 10*3/uL (ref 1.7–7.7)
Neutrophils Relative %: 56 %
Platelets: 88 10*3/uL — ABNORMAL LOW (ref 150–400)
RBC: 2.41 MIL/uL — ABNORMAL LOW (ref 3.87–5.11)
RDW: 15.5 % (ref 11.5–15.5)
WBC: 7 10*3/uL (ref 4.0–10.5)
nRBC: 0 % (ref 0.0–0.2)

## 2022-02-06 MED ORDER — SODIUM CHLORIDE 0.9 % IV SOLN
10.0000 mg | Freq: Once | INTRAVENOUS | Status: AC
  Administered 2022-02-06: 10 mg via INTRAVENOUS
  Filled 2022-02-06: qty 10

## 2022-02-06 MED ORDER — SODIUM CHLORIDE 0.9 % IV SOLN: Freq: Once | INTRAVENOUS | Status: AC

## 2022-02-06 MED ORDER — PALONOSETRON HCL INJECTION 0.25 MG/5ML
0.2500 mg | Freq: Once | INTRAVENOUS | Status: AC
  Administered 2022-02-06: 0.25 mg via INTRAVENOUS
  Filled 2022-02-06: qty 5

## 2022-02-06 MED ORDER — ACETAMINOPHEN-CODEINE #3 300-30 MG PO TABS: 1.0000 | ORAL_TABLET | Freq: Four times a day (QID) | ORAL | 0 refills | Status: DC | PRN

## 2022-02-06 MED ORDER — SODIUM CHLORIDE 0.9 % IV SOLN
75.0000 mg/m2 | Freq: Once | INTRAVENOUS | Status: AC
  Administered 2022-02-06: 120 mg via INTRAVENOUS
  Filled 2022-02-06: qty 12

## 2022-02-06 MED ORDER — HEPARIN SOD (PORK) LOCK FLUSH 100 UNIT/ML IV SOLN
500.0000 [IU] | Freq: Once | INTRAVENOUS | Status: AC | PRN
  Administered 2022-02-06: 500 [IU]

## 2022-02-06 MED ORDER — SODIUM CHLORIDE 0.9% FLUSH
10.0000 mL | INTRAVENOUS | Status: DC | PRN
  Administered 2022-02-06: 10 mL

## 2022-02-06 MED ORDER — SODIUM CHLORIDE 0.9% FLUSH
10.0000 mL | Freq: Once | INTRAVENOUS | Status: AC
  Administered 2022-02-06: 10 mL

## 2022-02-06 MED FILL — Dexamethasone Sodium Phosphate Inj 100 MG/10ML: INTRAMUSCULAR | Qty: 1 | Status: AC

## 2022-02-06 NOTE — Patient Instructions (Signed)
[facility] ONCOLOGY  Discharge Instructions: Thank you for choosing [facility] to provide your oncology and hematology care.   If you have a lab appointment with the [facility], please go directly to the [facility] and check in at the registration area.   Wear comfortable clothing and clothing appropriate for easy access to any Portacath or PICC line.   We strive to give you quality time with your provider. You may need to reschedule your appointment if you arrive late (15 or more minutes).  Arriving late affects you and other patients whose appointments are after yours.  Also, if you miss three or more appointments without notifying the office, you may be dismissed from the clinic at the providers discretion.      For prescription refill requests, have your pharmacy contact our office and allow 72 hours for refills to be completed.    Today you received the following chemotherapy and/or immunotherapy agents: Vidaza   To help prevent nausea and vomiting after your treatment, we encourage you to take your nausea medication as directed.  BELOW ARE SYMPTOMS THAT SHOULD BE REPORTED IMMEDIATELY: *FEVER GREATER THAN 100.4 F (38 C) OR HIGHER *CHILLS OR SWEATING *NAUSEA AND VOMITING THAT IS NOT CONTROLLED WITH YOUR NAUSEA MEDICATION *UNUSUAL SHORTNESS OF BREATH *UNUSUAL BRUISING OR BLEEDING *URINARY PROBLEMS (pain or burning when urinating, or frequent urination) *BOWEL PROBLEMS (unusual diarrhea, constipation, pain near the anus) TENDERNESS IN MOUTH AND THROAT WITH OR WITHOUT PRESENCE OF ULCERS (sore throat, sores in mouth, or a toothache) UNUSUAL RASH, SWELLING OR PAIN  UNUSUAL VAGINAL DISCHARGE OR ITCHING   Items with * indicate a potential emergency and should be followed up as soon as possible or go to the Emergency Department if any problems should occur.  Please show the CHEMOTHERAPY ALERT CARD or IMMUNOTHERAPY ALERT CARD at check-in to the  Emergency Department and triage nurse.  Should you have questions after your visit or need to cancel or reschedule your appointment, please contact [facility]  Dept: [phone]  and follow the prompts.  Office hours are 8:00 a.m. to 4:30 p.m. Monday - Friday. Please note that voicemails left after 4:00 p.m. may not be returned until the following business day.  We are closed weekends and major holidays. You have access to a nurse at all times for urgent questions. Please call the main number to the clinic Dept: [phone] and follow the prompts.   For any non-urgent questions, you may also contact your provider using MyChart. We now offer e-Visits for anyone 26 and older to request care online for non-urgent symptoms. For details visit [email or web address].   Also download the MyChart app! Go to the app store, search "MyChart", open the app, select [facility], and log in with your MyChart username and password.  Due to Covid, a mask is required upon entering the hospital/clinic. If you do not have a mask, one will be given to you upon arrival. For doctor visits, patients may have 1 support person aged 5 or older with them. For treatment visits, patients cannot have anyone with them due to current Covid guidelines and our immunocompromised population.   Azacitidine suspension for injection (subcutaneous use) What is this medication? AZACITIDINE ([place]) is a chemotherapy drug. This medicine reduces the growth of cancer cells and can suppress the immune system. It is used for treating myelodysplastic syndrome or some types of leukemia. This medicine may be used for  other purposes; ask your health care provider or pharmacist if you have questions. COMMON BRAND NAME(S): Vidaza What should I tell my care team before I take this medication? They need to know if you have any of these conditions: kidney disease liver disease liver tumors an unusual or  allergic reaction to azacitidine, mannitol, other medicines, foods, dyes, or preservatives pregnant or trying to get pregnant breast-feeding How should I use this medication? This medicine is for injection under the skin. It is administered in a hospital or clinic by a specially trained health care professional. Talk to your pediatrician regarding the use of this medicine in children. While this drug may be prescribed for selected conditions, precautions do apply. Overdosage: If you think you have taken too much of this medicine contact a poison control center or emergency room at once. NOTE: This medicine is only for you. Do not share this medicine with others. What if I miss a dose? It is important not to miss your dose. Call your doctor or health care professional if you are unable to keep an appointment. What may interact with this medication? Interactions have not been studied. Give your health care provider a list of all the medicines, herbs, non-prescription drugs, or dietary supplements you use. Also tell them if you smoke, drink alcohol, or use illegal drugs. Some items may interact with your medicine. This list may not describe all possible interactions. Give your health care provider a list of all the medicines, herbs, non-prescription drugs, or dietary supplements you use. Also tell them if you smoke, drink alcohol, or use illegal drugs. Some items may interact with your medicine. What should I watch for while using this medication? Visit your doctor for checks on your progress. This drug may make you feel generally unwell. This is not uncommon, as chemotherapy can affect healthy cells as well as cancer cells. Report any side effects. Continue your course of treatment even though you feel ill unless your doctor tells you to stop. In some cases, you may be given additional medicines to help with side effects. Follow all directions for their use. Call your doctor or health care  professional for advice if you get a fever, chills or sore throat, or other symptoms of a cold or flu. Do not treat yourself. This drug decreases your body's ability to fight infections. Try to avoid being around people who are sick. This medicine may increase your risk to bruise or bleed. Call your doctor or health care professional if you notice any unusual bleeding. You may need blood work done while you are taking this medicine. Do not become pregnant while taking this medicine and for 6 months after the last dose. Women should inform their doctor if they wish to become pregnant or think they might be pregnant. Men should not father a child while taking this medicine and for 3 months after the last dose. There is a potential for serious side effects to an unborn child. Talk to your health care professional or pharmacist for more information. Do not breast-feed an infant while taking this medicine and for 1 week after the last dose. This medicine may interfere with the ability to have a child. Talk with your doctor or health care professional if you are concerned about your fertility. What side effects may I notice from receiving this medication? Side effects that you should report to your doctor or health care professional as soon as possible: allergic reactions like skin rash, itching or hives, swelling of  the face, lips, or tongue low blood counts - this medicine may decrease the number of white blood cells, red blood cells and platelets. You may be at increased risk for infections and bleeding. signs of infection - fever or chills, cough, sore throat, pain passing urine signs of decreased platelets or bleeding - bruising, pinpoint red spots on the skin, black, tarry stools, blood in the urine signs of decreased red blood cells - unusually weak or tired, fainting spells, lightheadedness signs and symptoms of kidney injury like trouble passing urine or change in the amount of urine signs and  symptoms of liver injury like dark yellow or brown urine; general ill feeling or flu-like symptoms; light-colored stools; loss of appetite; nausea; right upper belly pain; unusually weak or tired; yellowing of the eyes or skin Side effects that usually do not require medical attention (report to your doctor or health care professional if they continue or are bothersome): constipation diarrhea nausea, vomiting pain or redness at the injection site unusually weak or tired This list may not describe all possible side effects. Call your doctor for medical advice about side effects. You may report side effects to FDA at 1-800-FDA-1088. Where should I keep my medication? This drug is given in a hospital or clinic and will not be stored at home. NOTE: This sheet is a summary. It may not cover all possible information. If you have questions about this medicine, talk to your doctor, pharmacist, or health care provider.  2022 Elsevier/Gold Standard (2017-01-10 00:00:00)

## 2022-02-06 NOTE — Progress Notes (Signed)
OK to trt w/ Plts 88 per Dr. Burr Medico

## 2022-02-06 NOTE — Progress Notes (Signed)
[facility]   [phone] [phone]) [phone]   Clinic Follow up Note   Patient Care Team: [name], MD as PCP - General (Family Medicine) [name], Wonda Cheng, MD as PCP - Cardiology (Cardiology) Gavin Pound, MD as Consulting Physician (Rheumatology) Truitt Merle, MD as Consulting Physician (Hematology) Juanita Craver, MD as Consulting Physician (Gastroenterology) Garner Nash, DO as Consulting Physician (Pulmonary Disease)  Date of Service:  02/06/2022  CHIEF COMPLAINT: f/u of CMML  CURRENT THERAPY:  Azacitadine, daily x7 every 28 days, starting 02/06/22 Irradiated PRBC if Hg<8 and plt transfusion if plt<20K   ASSESSMENT & PLAN:  [name] is a 76 y.o. female with   1. 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 SPEP and UPEP with immunofixation were negative. No lab evidence of hemolysis, erythropoietin level is normal.  -Her prior bone marrow biopsy in 2016 was negative except several small lymphoid aggregation, suspicious for low-grade B cell lymphoproliferative process, especially SLL. Her peripheral white count has been normal, no elevated lymphocytes -CT scan from 03/2015 was negative for adenopathy or splenomegaly. no B symptoms  -she has been under observation -She developed worsening back pain in Summer 2022 and underwent work up, pelvic MRI 07/04/21 showed a mass in the presacral space. -bone marrow biopsy on 01/05/22 showed hypercellular marrow with features of myelodysplastic/myeloproliferative neoplasm, increased 12% blasts, most consistent with CMML-2. Her BM biopsy was reviewed at [facility] and was felt to be CMML-1 with 5% blasts. Her previous presacral soft tissue biopsy which showed same CMML as her bone marrow biopsy. -she met Dr. Linus Orn at [facility] on 01/26/22. Vidaza was also recommended  -labs reviewed, overall stable. We will proceed with  azacitadine today and daily for 7 days (5+2) every 28 days. I again reviewed side effects with her.  -f/u in 2 weeks.     2. Rheumatoid arthritis -s/p right shoulder replacement 01/2017 -on Remicade q8 weeks per rheumatologist Dr. Trudie Reed. We reviewed the risk of lymphoma and immunosupression on Remicade -She takes vitamin supplements and gets prolia injection q61month with her PCP.    3. COPD, AF, HTN -f/u with her PCP and continue medical treatments    PLAN: -proceed with Azacitadine infusion daily today through 2/17 and 2/20-2/21 -lab weekly x6 starting 2/20 -f/u in 2 and 4 weeks -cycle 2 Azacitadine daily 3/13-3/17, 3/20-3/21. -nutrition consult    No problem-specific Assessment & Plan notes found for this encounter.   SUMMARY OF ONCOLOGIC HISTORY: Oncology History  CMML (chronic myelomonocytic leukemia) ([place]  11/09/2021 Initial Biopsy   DIAGNOSIS:   -  Monoclonal B-cell population with co-expression of CD5 comprises 17%  of all lymphocytes  -  See comment   COMMENT:  In addition to the clonal B-cell population, there is a myeloblast  population (CD34, CD38, HLA-DR, CD117, CD123 and CD33) that comprises 2% of the total cellular events.  Please see concurrent tissue biopsy (below) for additional work-up and final diagnosis.    FINAL MICROSCOPIC DIAGNOSIS:   A. SOFT TISSUE MASS, PRE SACRAL, NEEDLE CORE BIOPSY:  -  Chronic lymphocytic leukemia/small lymphocytic lymphoma  -  Extra medullary hematopoiesis  -  See comment   COMMENT:  The biopsy consists of multiple soft tissue cores with lymphoid nodules and a dense hematopoietic infiltrate consistent with extra medullary hematopoiesis.  MPO and E-cadherin highlight myeloid and erythroid precursors respectively.  CD34 highlights increased vasculature and  is also positive within the cytoplasm of megakaryocytes.  A few small, immature mononuclear cells appear to be positive for CD34 and CD117. TdT shows rare, scattered  positive cells.  CD20 highlights aggregates of B cells which are admixed with CD3 positive T cells.  T cells are an admixture of CD4 and CD8.  The B cells are also positive for CD5, CD23 and Bcl-2.  The B cells do not show significant staining for CD10, BCL6 or cyclin D1.  CD138 highlights scattered plasma cells which are polytypic by kappa and lambda in situ hybridization.  Flow cytometry performed on the sample (see WL [id number]) identified a kappa restricted CD5 positive B-cell population comprising 70% of lymphocytes.  In addition, a small myeloblast population comprised 2% of the total cellular events.   Overall, the findings are consistent with soft tissue involvement by  chronic lymphocytic leukemia/small lymphocytic lymphoma and extra medullary hematopoiesis. In reviewing the patient's CBC data (macrocytic anemia and thrombocytopenia), I would recommend a bone marrow biopsy to assess for marrow involvement by CLL/SLL.    12/23/2021 Imaging   EXAM: CT CHEST, ABDOMEN, AND PELVIS WITH CONTRAST  IMPRESSION: 1. Slight interval enlargement of a presacral soft tissue mass measuring 7.2 x 4.7 cm, previously 6.9 x 4.1 cm on prior MR of the pelvis dated 07/04/2021. By report, this represents a biopsy proven lymphoma. 2. Pleural nodule of the dependent right lower lobe overlying the posterior right tenth rib and pleural or paraspinous soft tissue mass overlying the right aspect of the T10 vertebral body, very slightly enlarged compared to prior examination of the chest dated 06/25/2020, consistent with additional sites of lymphomatous involvement given very indolent growth. These could be better assessed for metabolic activity by FDG PET/CT if desired. 3. There is mild, bibasilar predominant pulmonary fibrosis in a pattern featuring irregular peripheral interstitial opacity, septal thickening, but without clear evidence of subpleural bronchiolectasis or honeycombing, with a somewhat  asymmetric distribution most conspicuously involving the right lower lobe and lingula. These findings are significantly worsened when compared to prior examination dated 06/25/2020, particularly in the right lower lobe. Given interval change, this may reflect sequelae of interval infection or aspiration, however appearance is generally suspicious for fibrotic interstitial lung disease, and if characterized by ATS pulmonary fibrosis is in an "indeterminate for UIP" pattern, differential considerations including both UIP and NSIP. 4. Emphysema.   Aortic Atherosclerosis (ICD10-I70.0) and Emphysema (ICD10-J43.9).   01/05/2022 Pathology Results   DIAGNOSIS:   BONE MARROW, ASPIRATE, CLOT, CORE:  -Hypercellular bone marrow for age with features of  myelodysplastic/myeloproliferative neoplasm  -Minor abnormal B-cell population  -See comment   PERIPHERAL BLOOD:  -Macrocytic anemia  -Neutrophilic left shift and monocytosis  -Thrombocytopenia   COMMENT:  The bone marrow is hypercellular for age with dyspoietic changes  involving myeloid cell lines associated with monocytosis and increased number of blastic cells (12%) as primarily seen by morphology, many of which display monocytic features.  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  In this background, there are several predominantly small lymphoid aggregates mostly composed of small lymphoid cells.  By flow cytometry, a minor abnormal B-cell population expressing CD5 is seen and representing 2% of all cells.  This correlate with previously known B-cell lymphoproliferative process.  Correlation with cytogenetic and FISH studies is strongly recommended.    DIAGNOSIS:   -Increased number of monocytic cells present (25%)  -Minor abnormal B-cell  population identified.  -See  comment   COMMENT:  Flow cytometric analysis shows increased number of monocytic cells representing 25% of all cells but without aberrant phenotype or CD34 expression.  A significant CD34-positive blastic population is not identified.  The lymphoid population shows a minor B-cell population representing 2% of all cells and expressing B-cell antigens including CD20 associated with CD5, CD200 and possibly dim kappa expression.  The latter findings are abnormal and correlate with previously known B-cell lymphoproliferative process.  No significant T-cell phenotypic abnormalities identified.    01/12/2022 Initial Diagnosis   CMML (chronic myelomonocytic leukemia) ([place])   01/12/2022 Cancer Staging   Staging form: Chronic Myeloid Leukemia, AJCC 8th Edition - Clinical stage from 01/12/2022: Bone marrow blast count (%): 12, Additional clonal changes: Unknown - Signed by Truitt Merle, MD on 01/12/2022 Stage prefix: Initial diagnosis    02/06/2022 -  Chemotherapy   Patient is on Treatment Plan : MYELODYSPLASIA  Azacitidine IV D1-7 q28d        INTERVAL HISTORY:  [name] is here for a follow up of CMML. She was last seen by me on 01/15/22. She presents to the clinic accompanied by her husband. She reports her pain level fluctuates; she manages with tylenol.   All other systems were reviewed with the patient and are negative.  MEDICAL HISTORY:  Past Medical History:  Diagnosis Date   Arthritis    Rheumatoid arthritis   Celiac disease    Chronic kidney disease    stage 3 from MD notes   COPD (chronic obstructive pulmonary disease) ([place])    Dyspnea    with going up stairs   Family history of adverse reaction to anesthesia    father had hard time waking up   Headache    sinus headaches   Hot flashes    Hypertension    Iron deficiency anemia    Pneumonia    per patient "I have walking pneumonia"    SURGICAL HISTORY: Past Surgical History:  Procedure Laterality Date   COLONOSCOPY      ECTOPIC PREGNANCY SURGERY      x 2   IR IMAGING GUIDED PORT INSERTION  01/31/2022   REVERSE SHOULDER ARTHROPLASTY Right 02/02/2017   Procedure: RIGHT REVERSE SHOULDER ARTHROPLASTY;  Surgeon: Netta Cedars, MD;  Location: [facility];  Service: Orthopedics;  Laterality: Right;    I have reviewed the social history and family history with the patient and they are unchanged from previous note.  ALLERGIES:  is allergic to gluten meal, penicillins, fosamax [alendronate], and hydroxychloroquine.  MEDICATIONS:  Current Outpatient Medications  Medication Sig Dispense Refill   acetaminophen-codeine (TYLENOL #3) 300-30 MG tablet Take 1 tablet by mouth every 6 (six) hours as needed for moderate pain. 15 tablet 0   acetaminophen (TYLENOL) 500 MG tablet Take 1,000 mg by mouth every 6 (six) hours as needed for mild pain.     apixaban (ELIQUIS) 5 MG TABS tablet Take 1 tablet (5 mg total) by mouth 2 (two) times daily. 60 tablet 11   Calcium-Magnesium-Vitamin D (CALCIUM 1200+D3 PO) Take 1 tablet by mouth daily.     denosumab (PROLIA) 60 MG/ML SOSY injection Inject 60 mg into the skin every 6 (six) months.     gabapentin (NEURONTIN) 300 MG capsule Take 300 mg by mouth 3 (three) times daily.      InFLIXimab (REMICADE IV) Inject 10 mg into the vein every 8 (eight) weeks.     lidocaine-prilocaine (EMLA) cream Apply to affected area  once 30 g 3   losartan (COZAAR) 50 MG tablet Take 50 mg by mouth daily.      metoprolol succinate (TOPROL-XL) 50 MG 24 hr tablet Take 50 mg by mouth every morning.      ondansetron (ZOFRAN) 8 MG tablet Take 1 tablet (8 mg total) by mouth 2 (two) times daily as needed for refractory nausea / vomiting. Start on the third day after last day of chemotherapy. 30 tablet 1   predniSONE (DELTASONE) 5 MG tablet Take 5 mg by mouth daily. TAKE 1 TABLET ORALLY ONCE A DAY FOR 30 DAYS     prochlorperazine (COMPAZINE) 10 MG tablet Take 1 tablet (10 mg total) by mouth every 6 (six) hours as needed (Nausea  or vomiting). 30 tablet 1   Sod Picosulfate-Mag Ox-Cit Acd (CLENPIQ) 10-3.5-12 MG-GM -GM/160ML SOLN 160 mL orally twice for 2     Tiotropium Bromide-Olodaterol (STIOLTO RESPIMAT) 2.5-2.5 MCG/ACT AERS Inhale 2 puffs into the lungs daily. 4 g 11   vitamin C (ASCORBIC ACID) 500 MG tablet Take 500 mg by mouth daily.     No current facility-administered medications for this visit.   Facility-Administered Medications Ordered in Other Visits  Medication Dose Route Frequency Provider Last Rate Last Admin   sodium chloride flush (NS) 0.9 % injection 10 mL  10 mL Intracatheter PRN Truitt Merle, MD   10 mL at 02/06/22 1642    PHYSICAL EXAMINATION: ECOG PERFORMANCE STATUS: 2 - Symptomatic, <50% confined to bed  Vitals:   02/06/22 1427  BP: (!) 165/73  Pulse: 86  Resp: 18  Temp: 97.9 F (36.6 C)  SpO2: 100%   Wt Readings from Last 3 Encounters:  02/06/22 126 lb 3.2 oz (57.2 kg)  01/31/22 126 lb 8 oz (57.4 kg)  01/12/22 126 lb 8 oz (57.4 kg)     GENERAL:alert, no distress and comfortable SKIN: skin color normal, no rashes or significant lesions EYES: normal, Conjunctiva are pink and non-injected, sclera clear  NEURO: alert & oriented x 3 with fluent speech  LABORATORY DATA:  I have reviewed the data as listed CBC Latest Ref Rng & Units 02/06/2022 01/05/2022 12/08/2021  WBC 4.0 - 10.5 K/uL 7.0 10.5 8.9  Hemoglobin 12.0 - 15.0 g/dL 8.5(L) 9.9(L) 10.3(L)  Hematocrit 36.0 - 46.0 % 26.9(L) 30.6(L) 31.9(L)  Platelets 150 - 400 K/uL 88(L) 83(L) 80(L)     CMP Latest Ref Rng & Units 02/06/2022 12/08/2021 07/20/2021  Glucose 70 - 99 mg/dL 115(H) 212(H) 93  BUN 8 - 23 mg/dL 24(H) 19 19  Creatinine 0.44 - 1.00 mg/dL 0.98 1.19(H) 1.10(H)  Sodium 135 - 145 mmol/L 136 137 134(L)  Potassium 3.5 - 5.1 mmol/L 4.3 4.7 4.1  Chloride 98 - 111 mmol/L 101 103 101  CO2 22 - 32 mmol/L 25 21(L) 26  Calcium 8.9 - 10.3 mg/dL 9.7 9.6 9.1  Total Protein 6.5 - 8.1 g/dL 6.6 7.1 6.0(L)  Total Bilirubin 0.3 - 1.2  mg/dL 0.9 0.6 0.9  Alkaline Phos 38 - 126 U/L 43 49 42  AST 15 - 41 U/L _0 ALT 0 - 44 U/L _1 RADIOGRAPHIC STUDIES: I have personally reviewed the radiological images as listed and agreed with the findings in the report. No results found.    Orders Placed This Encounter  Procedures   Ambulatory Referral to [facility]    Referral Priority:   Routine    Referral Type:   Consultation  Referral Reason:   Specialty Services Required    Number of Visits Requested:   1   All questions were answered. The patient knows to call the clinic with any problems, questions or concerns. No barriers to learning was detected. The total time spent in the appointment was 30 minutes.     Truitt Merle, MD 02/06/2022   I, Wilburn Mylar, am acting as scribe for Truitt Merle, MD.   I have reviewed the above documentation for accuracy and completeness, and I agree with the above.

## 2022-02-07 ENCOUNTER — Inpatient Hospital Stay: Payer: Medicare Other

## 2022-02-07 ENCOUNTER — Other Ambulatory Visit: Payer: Self-pay

## 2022-02-07 VITALS — BP 142/53 | HR 81 | Temp 97.6°F | Resp 18

## 2022-02-07 DIAGNOSIS — C931 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: Secondary | ICD-10-CM

## 2022-02-07 DIAGNOSIS — D696 Thrombocytopenia, unspecified: Secondary | ICD-10-CM | POA: Diagnosis not present

## 2022-02-07 DIAGNOSIS — Z5111 Encounter for antineoplastic chemotherapy: Secondary | ICD-10-CM | POA: Diagnosis not present

## 2022-02-07 DIAGNOSIS — D649 Anemia, unspecified: Secondary | ICD-10-CM | POA: Diagnosis not present

## 2022-02-07 MED ORDER — SODIUM CHLORIDE 0.9 % IV SOLN: Freq: Once | INTRAVENOUS | Status: AC

## 2022-02-07 MED ORDER — SODIUM CHLORIDE 0.9 % IV SOLN
75.0000 mg/m2 | Freq: Once | INTRAVENOUS | Status: AC
  Administered 2022-02-07: 120 mg via INTRAVENOUS
  Filled 2022-02-07: qty 12

## 2022-02-07 MED ORDER — HEPARIN SOD (PORK) LOCK FLUSH 100 UNIT/ML IV SOLN: 500.0000 [IU] | Freq: Once | INTRAVENOUS | Status: DC | PRN

## 2022-02-07 MED ORDER — SODIUM CHLORIDE 0.9% FLUSH: 10.0000 mL | INTRAVENOUS | Status: DC | PRN

## 2022-02-07 MED ORDER — SODIUM CHLORIDE 0.9 % IV SOLN
10.0000 mg | Freq: Once | INTRAVENOUS | Status: AC
  Administered 2022-02-07: 10 mg via INTRAVENOUS
  Filled 2022-02-07: qty 10

## 2022-02-07 MED FILL — Dexamethasone Sodium Phosphate Inj 100 MG/10ML: INTRAMUSCULAR | Qty: 1 | Status: AC

## 2022-02-07 NOTE — Patient Instructions (Signed)
[facility]  Discharge Instructions: Thank you for choosing [facility] to provide your oncology and hematology care.   If you have a lab appointment with the Cancer Center, please go directly to the Cancer Center and check in at the registration area.   Wear comfortable clothing and clothing appropriate for easy access to any Portacath or PICC line.   We strive to give you quality time with your provider. You may need to reschedule your appointment if you arrive late (15 or more minutes).  Arriving late affects you and other patients whose appointments are after yours.  Also, if you miss three or more appointments without notifying the office, you may be dismissed from the clinic at the provider's discretion.      For prescription refill requests, have your pharmacy contact our office and allow 72 hours for refills to be completed.    Today you received the following chemotherapy and/or immunotherapy agents vidaza      To help prevent nausea and vomiting after your treatment, we encourage you to take your nausea medication as directed.  BELOW ARE SYMPTOMS THAT SHOULD BE REPORTED IMMEDIATELY: . *FEVER GREATER THAN 100.4 F (38 C) OR HIGHER . *CHILLS OR SWEATING . *NAUSEA AND VOMITING THAT IS NOT CONTROLLED WITH YOUR NAUSEA MEDICATION . *UNUSUAL SHORTNESS OF BREATH . *UNUSUAL BRUISING OR BLEEDING . *URINARY PROBLEMS (pain or burning when urinating, or frequent urination) . *BOWEL PROBLEMS (unusual diarrhea, constipation, pain near the anus) . TENDERNESS IN MOUTH AND THROAT WITH OR WITHOUT PRESENCE OF ULCERS (sore throat, sores in mouth, or a toothache) . UNUSUAL RASH, SWELLING OR PAIN  . UNUSUAL VAGINAL DISCHARGE OR ITCHING   Items with * indicate a potential emergency and should be followed up as soon as possible or go to the Emergency Department if any problems should occur.  Please show the CHEMOTHERAPY ALERT CARD or IMMUNOTHERAPY ALERT CARD  at check-in to the Emergency Department and triage nurse.  Should you have questions after your visit or need to cancel or reschedule your appointment, please contact [facility]  Dept: [phone]  and follow the prompts.  Office hours are 8:00 a.m. to 4:30 p.m. Monday - Friday. Please note that voicemails left after 4:00 p.m. may not be returned until the following business day.  We are closed weekends and major holidays. You have access to a nurse at all times for urgent questions. Please call the main number to the clinic Dept: [phone] and follow the prompts.   For any non-urgent questions, you may also contact your provider using MyChart. We now offer e-Visits for anyone 18 and older to request care online for non-urgent symptoms. For details visit [email or web address].   Also download the MyChart app! Go to the app store, search "MyChart", open the app, select , and log in with your MyChart username and password.  Due to Covid, a mask is required upon entering the hospital/clinic. If you do not have a mask, one will be given to you upon arrival. For doctor visits, patients may have 1 support person aged 18 or older with them. For treatment visits, patients cannot have anyone with them due to current Covid guidelines and our immunocompromised population.   

## 2022-02-08 ENCOUNTER — Inpatient Hospital Stay: Payer: Medicare Other

## 2022-02-08 VITALS — BP 159/75 | HR 80 | Temp 98.1°F | Resp 18

## 2022-02-08 DIAGNOSIS — C931 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: Secondary | ICD-10-CM

## 2022-02-08 DIAGNOSIS — Z5111 Encounter for antineoplastic chemotherapy: Secondary | ICD-10-CM | POA: Diagnosis not present

## 2022-02-08 DIAGNOSIS — D649 Anemia, unspecified: Secondary | ICD-10-CM | POA: Diagnosis not present

## 2022-02-08 DIAGNOSIS — D696 Thrombocytopenia, unspecified: Secondary | ICD-10-CM | POA: Diagnosis not present

## 2022-02-08 MED ORDER — SODIUM CHLORIDE 0.9 % IV SOLN
10.0000 mg | Freq: Once | INTRAVENOUS | Status: AC
  Administered 2022-02-08: 10 mg via INTRAVENOUS
  Filled 2022-02-08: qty 10

## 2022-02-08 MED ORDER — SODIUM CHLORIDE 0.9 % IV SOLN
75.0000 mg/m2 | Freq: Once | INTRAVENOUS | Status: AC
  Administered 2022-02-08: 120 mg via INTRAVENOUS
  Filled 2022-02-08: qty 12

## 2022-02-08 MED ORDER — HEPARIN SOD (PORK) LOCK FLUSH 100 UNIT/ML IV SOLN
500.0000 [IU] | Freq: Once | INTRAVENOUS | Status: AC | PRN
  Administered 2022-02-08: 500 [IU]

## 2022-02-08 MED ORDER — SODIUM CHLORIDE 0.9% FLUSH
10.0000 mL | INTRAVENOUS | Status: DC | PRN
  Administered 2022-02-08: 10 mL

## 2022-02-08 MED ORDER — SODIUM CHLORIDE 0.9 % IV SOLN: Freq: Once | INTRAVENOUS | Status: AC

## 2022-02-08 MED ORDER — PALONOSETRON HCL INJECTION 0.25 MG/5ML
0.2500 mg | Freq: Once | INTRAVENOUS | Status: AC
  Administered 2022-02-08: 0.25 mg via INTRAVENOUS
  Filled 2022-02-08: qty 5

## 2022-02-08 MED FILL — Dexamethasone Sodium Phosphate Inj 100 MG/10ML: INTRAMUSCULAR | Qty: 1 | Status: AC

## 2022-02-08 NOTE — Patient Instructions (Signed)
[name]  Discharge Instructions: Thank you for choosing [facility] to provide your oncology and hematology care.   If you have a lab appointment with the Cancer Center, please go directly to the Cancer Center and check in at the registration area.   Wear comfortable clothing and clothing appropriate for easy access to any Portacath or PICC line.   We strive to give you quality time with your provider. You may need to reschedule your appointment if you arrive late (15 or more minutes).  Arriving late affects you and other patients whose appointments are after yours.  Also, if you miss three or more appointments without notifying the office, you may be dismissed from the clinic at the provider's discretion.      For prescription refill requests, have your pharmacy contact our office and allow 72 hours for refills to be completed.    Today you received the following chemotherapy and/or immunotherapy agents vidaza      To help prevent nausea and vomiting after your treatment, we encourage you to take your nausea medication as directed.  BELOW ARE SYMPTOMS THAT SHOULD BE REPORTED IMMEDIATELY: . *FEVER GREATER THAN 100.4 F (38 C) OR HIGHER . *CHILLS OR SWEATING . *NAUSEA AND VOMITING THAT IS NOT CONTROLLED WITH YOUR NAUSEA MEDICATION . *UNUSUAL SHORTNESS OF BREATH . *UNUSUAL BRUISING OR BLEEDING . *URINARY PROBLEMS (pain or burning when urinating, or frequent urination) . *BOWEL PROBLEMS (unusual diarrhea, constipation, pain near the anus) . TENDERNESS IN MOUTH AND THROAT WITH OR WITHOUT PRESENCE OF ULCERS (sore throat, sores in mouth, or a toothache) . UNUSUAL RASH, SWELLING OR PAIN  . UNUSUAL VAGINAL DISCHARGE OR ITCHING   Items with * indicate a potential emergency and should be followed up as soon as possible or go to the Emergency Department if any problems should occur.  Please show the CHEMOTHERAPY ALERT CARD or IMMUNOTHERAPY ALERT CARD  at check-in to the Emergency Department and triage nurse.  Should you have questions after your visit or need to cancel or reschedule your appointment, please contact [facility]  Dept: [phone]  and follow the prompts.  Office hours are 8:00 a.m. to 4:30 p.m. Monday - Friday. Please note that voicemails left after 4:00 p.m. may not be returned until the following business day.  We are closed weekends and major holidays. You have access to a nurse at all times for urgent questions. Please call the main number to the clinic Dept: [phone] and follow the prompts.   For any non-urgent questions, you may also contact your provider using MyChart. We now offer e-Visits for anyone 18 and older to request care online for non-urgent symptoms. For details visit [email or web address].   Also download the MyChart app! Go to the app store, search "MyChart", open the app, select [facility], and log in with your MyChart username and password.  Due to Covid, a mask is required upon entering the hospital/clinic. If you do not have a mask, one will be given to you upon arrival. For doctor visits, patients may have 1 support person aged 18 or older with them. For treatment visits, patients cannot have anyone with them due to current Covid guidelines and our immunocompromised population.   

## 2022-02-09 ENCOUNTER — Other Ambulatory Visit: Payer: Self-pay

## 2022-02-09 ENCOUNTER — Inpatient Hospital Stay: Payer: Medicare Other

## 2022-02-09 VITALS — BP 125/58 | HR 81 | Temp 97.8°F | Resp 18

## 2022-02-09 DIAGNOSIS — D649 Anemia, unspecified: Secondary | ICD-10-CM | POA: Diagnosis not present

## 2022-02-09 DIAGNOSIS — Z95828 Presence of other vascular implants and grafts: Secondary | ICD-10-CM

## 2022-02-09 DIAGNOSIS — C931 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: Secondary | ICD-10-CM

## 2022-02-09 DIAGNOSIS — D696 Thrombocytopenia, unspecified: Secondary | ICD-10-CM | POA: Diagnosis not present

## 2022-02-09 DIAGNOSIS — Z5111 Encounter for antineoplastic chemotherapy: Secondary | ICD-10-CM | POA: Diagnosis not present

## 2022-02-09 LAB — CMP (CANCER CENTER ONLY)
ALT: 16 U/L (ref 0–44)
AST: 20 U/L (ref 15–41)
Albumin: 4.1 g/dL (ref 3.5–5.0)
Alkaline Phosphatase: 36 U/L — ABNORMAL LOW (ref 38–126)
Anion gap: 8 (ref 5–15)
BUN: 43 mg/dL — ABNORMAL HIGH (ref 8–23)
CO2: 25 mmol/L (ref 22–32)
Calcium: 9.1 mg/dL (ref 8.9–10.3)
Chloride: 100 mmol/L (ref 98–111)
Creatinine: 1.16 mg/dL — ABNORMAL HIGH (ref 0.44–1.00)
GFR, Estimated: 49 mL/min — ABNORMAL LOW (ref >60)
Glucose, Bld: 202 mg/dL — ABNORMAL HIGH (ref 70–99)
Potassium: 3.8 mmol/L (ref 3.5–5.1)
Sodium: 133 mmol/L — ABNORMAL LOW (ref 135–145)
Total Bilirubin: 0.6 mg/dL (ref 0.3–1.2)
Total Protein: 6.6 g/dL (ref 6.5–8.1)

## 2022-02-09 LAB — CBC WITH DIFFERENTIAL/PLATELET
Abs Immature Granulocytes: 0.08 10*3/uL — ABNORMAL HIGH (ref 0.00–0.07)
Basophils Absolute: 0 10*3/uL (ref 0.0–0.1)
Basophils Relative: 0 %
Eosinophils Absolute: 0 10*3/uL (ref 0.0–0.5)
Eosinophils Relative: 0 %
HCT: 27.4 % — ABNORMAL LOW (ref 36.0–46.0)
Hemoglobin: 8.7 g/dL — ABNORMAL LOW (ref 12.0–15.0)
Immature Granulocytes: 1 %
Lymphocytes Relative: 18 %
Lymphs Abs: 2.2 10*3/uL (ref 0.7–4.0)
MCH: 35.5 pg — ABNORMAL HIGH (ref 26.0–34.0)
MCHC: 31.8 g/dL (ref 30.0–36.0)
MCV: 111.8 fL — ABNORMAL HIGH (ref 80.0–100.0)
Monocytes Absolute: 4.4 10*3/uL — ABNORMAL HIGH (ref 0.1–1.0)
Monocytes Relative: 37 %
Neutro Abs: 5.4 10*3/uL (ref 1.7–7.7)
Neutrophils Relative %: 44 %
Platelets: 99 10*3/uL — ABNORMAL LOW (ref 150–400)
RBC: 2.45 MIL/uL — ABNORMAL LOW (ref 3.87–5.11)
RDW: 15.2 % (ref 11.5–15.5)
WBC: 12.1 10*3/uL — ABNORMAL HIGH (ref 4.0–10.5)
nRBC: 0.2 % (ref 0.0–0.2)

## 2022-02-09 LAB — SAMPLE TO BLOOD BANK

## 2022-02-09 MED ORDER — SODIUM CHLORIDE 0.9% FLUSH
10.0000 mL | INTRAVENOUS | Status: DC | PRN
  Administered 2022-02-09: 10 mL

## 2022-02-09 MED ORDER — HEPARIN SOD (PORK) LOCK FLUSH 100 UNIT/ML IV SOLN
500.0000 [IU] | Freq: Once | INTRAVENOUS | Status: AC | PRN
  Administered 2022-02-09: 500 [IU]

## 2022-02-09 MED ORDER — SODIUM CHLORIDE 0.9 % IV SOLN: Freq: Once | INTRAVENOUS | Status: AC

## 2022-02-09 MED ORDER — SODIUM CHLORIDE 0.9% FLUSH
10.0000 mL | Freq: Once | INTRAVENOUS | Status: AC
  Administered 2022-02-09: 10 mL

## 2022-02-09 MED ORDER — SODIUM CHLORIDE 0.9 % IV SOLN
10.0000 mg | Freq: Once | INTRAVENOUS | Status: AC
  Administered 2022-02-09: 10 mg via INTRAVENOUS
  Filled 2022-02-09: qty 1
  Filled 2022-02-09: qty 10

## 2022-02-09 MED ORDER — SODIUM CHLORIDE 0.9 % IV SOLN
75.0000 mg/m2 | Freq: Once | INTRAVENOUS | Status: AC
  Administered 2022-02-09: 120 mg via INTRAVENOUS
  Filled 2022-02-09: qty 12

## 2022-02-09 MED FILL — Dexamethasone Sodium Phosphate Inj 100 MG/10ML: INTRAMUSCULAR | Qty: 1 | Status: AC

## 2022-02-09 NOTE — Patient Instructions (Signed)
[facility]  Discharge Instructions: Thank you for choosing [facility] to provide your oncology and hematology care.   If you have a lab appointment with the Cancer Center, please go directly to the Cancer Center and check in at the registration area.   Wear comfortable clothing and clothing appropriate for easy access to any Portacath or PICC line.   We strive to give you quality time with your provider. You may need to reschedule your appointment if you arrive late (15 or more minutes).  Arriving late affects you and other patients whose appointments are after yours.  Also, if you miss three or more appointments without notifying the office, you may be dismissed from the clinic at the provider's discretion.      For prescription refill requests, have your pharmacy contact our office and allow 72 hours for refills to be completed.    Today you received the following chemotherapy and/or immunotherapy agents vidaza      To help prevent nausea and vomiting after your treatment, we encourage you to take your nausea medication as directed.  BELOW ARE SYMPTOMS THAT SHOULD BE REPORTED IMMEDIATELY: . *FEVER GREATER THAN 100.4 F (38 C) OR HIGHER . *CHILLS OR SWEATING . *NAUSEA AND VOMITING THAT IS NOT CONTROLLED WITH YOUR NAUSEA MEDICATION . *UNUSUAL SHORTNESS OF BREATH . *UNUSUAL BRUISING OR BLEEDING . *URINARY PROBLEMS (pain or burning when urinating, or frequent urination) . *BOWEL PROBLEMS (unusual diarrhea, constipation, pain near the anus) . TENDERNESS IN MOUTH AND THROAT WITH OR WITHOUT PRESENCE OF ULCERS (sore throat, sores in mouth, or a toothache) . UNUSUAL RASH, SWELLING OR PAIN  . UNUSUAL VAGINAL DISCHARGE OR ITCHING   Items with * indicate a potential emergency and should be followed up as soon as possible or go to the Emergency Department if any problems should occur.  Please show the CHEMOTHERAPY ALERT CARD or IMMUNOTHERAPY ALERT CARD  at check-in to the Emergency Department and triage nurse.  Should you have questions after your visit or need to cancel or reschedule your appointment, please contact [facility]  Dept: [phone]  and follow the prompts.  Office hours are 8:00 a.m. to 4:30 p.m. Monday - Friday. Please note that voicemails left after 4:00 p.m. may not be returned until the following business day.  We are closed weekends and major holidays. You have access to a nurse at all times for urgent questions. Please call the main number to the clinic Dept: [phone] and follow the prompts.   For any non-urgent questions, you may also contact your provider using MyChart. We now offer e-Visits for anyone 18 and older to request care online for non-urgent symptoms. For details visit [email or web address].   Also download the MyChart app! Go to the app store, search "MyChart", open the app, select [facility], and log in with your MyChart username and password.  Due to Covid, a mask is required upon entering the hospital/clinic. If you do not have a mask, one will be given to you upon arrival. For doctor visits, patients may have 1 support person aged 18 or older with them. For treatment visits, patients cannot have anyone with them due to current Covid guidelines and our immunocompromised population.   

## 2022-02-10 ENCOUNTER — Inpatient Hospital Stay: Payer: Medicare Other

## 2022-02-10 ENCOUNTER — Other Ambulatory Visit: Payer: Self-pay

## 2022-02-10 VITALS — BP 157/88 | HR 66 | Temp 97.9°F | Resp 17

## 2022-02-10 DIAGNOSIS — Z5111 Encounter for antineoplastic chemotherapy: Secondary | ICD-10-CM | POA: Diagnosis not present

## 2022-02-10 DIAGNOSIS — D649 Anemia, unspecified: Secondary | ICD-10-CM | POA: Diagnosis not present

## 2022-02-10 DIAGNOSIS — D696 Thrombocytopenia, unspecified: Secondary | ICD-10-CM | POA: Diagnosis not present

## 2022-02-10 DIAGNOSIS — C931 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: Secondary | ICD-10-CM

## 2022-02-10 MED ORDER — SODIUM CHLORIDE 0.9 % IV SOLN
10.0000 mg | Freq: Once | INTRAVENOUS | Status: AC
  Administered 2022-02-10: 10 mg via INTRAVENOUS
  Filled 2022-02-10: qty 10
  Filled 2022-02-10: qty 1

## 2022-02-10 MED ORDER — SODIUM CHLORIDE 0.9 % IV SOLN: Freq: Once | INTRAVENOUS | Status: AC

## 2022-02-10 MED ORDER — PALONOSETRON HCL INJECTION 0.25 MG/5ML
0.2500 mg | Freq: Once | INTRAVENOUS | Status: AC
  Administered 2022-02-10: 0.25 mg via INTRAVENOUS
  Filled 2022-02-10: qty 5

## 2022-02-10 MED ORDER — HEPARIN SOD (PORK) LOCK FLUSH 100 UNIT/ML IV SOLN
500.0000 [IU] | Freq: Once | INTRAVENOUS | Status: AC | PRN
  Administered 2022-02-10: 500 [IU]

## 2022-02-10 MED ORDER — SODIUM CHLORIDE 0.9 % IV SOLN
75.0000 mg/m2 | Freq: Once | INTRAVENOUS | Status: AC
  Administered 2022-02-10: 120 mg via INTRAVENOUS
  Filled 2022-02-10: qty 12

## 2022-02-10 MED ORDER — SODIUM CHLORIDE 0.9% FLUSH
10.0000 mL | INTRAVENOUS | Status: DC | PRN
  Administered 2022-02-10: 10 mL

## 2022-02-10 MED FILL — Dexamethasone Sodium Phosphate Inj 100 MG/10ML: INTRAMUSCULAR | Qty: 1 | Status: AC

## 2022-02-10 NOTE — Patient Instructions (Signed)
[facility]  Discharge Instructions: Thank you for choosing [facility] to provide your oncology and hematology care.   If you have a lab appointment with the Cancer Center, please go directly to the Cancer Center and check in at the registration area.   Wear comfortable clothing and clothing appropriate for easy access to any Portacath or PICC line.   We strive to give you quality time with your provider. You may need to reschedule your appointment if you arrive late (15 or more minutes).  Arriving late affects you and other patients whose appointments are after yours.  Also, if you miss three or more appointments without notifying the office, you may be dismissed from the clinic at the provider's discretion.      For prescription refill requests, have your pharmacy contact our office and allow 72 hours for refills to be completed.   Today you received the following chemotherapy and/or immunotherapy agents Vidaza     To help prevent nausea and vomiting after your treatment, we encourage you to take your nausea medication as directed.  BELOW ARE SYMPTOMS THAT SHOULD BE REPORTED IMMEDIATELY: *FEVER GREATER THAN 100.4 F (38 C) OR HIGHER *CHILLS OR SWEATING *NAUSEA AND VOMITING THAT IS NOT CONTROLLED WITH YOUR NAUSEA MEDICATION *UNUSUAL SHORTNESS OF BREATH *UNUSUAL BRUISING OR BLEEDING *URINARY PROBLEMS (pain or burning when urinating, or frequent urination) *BOWEL PROBLEMS (unusual diarrhea, constipation, pain near the anus) TENDERNESS IN MOUTH AND THROAT WITH OR WITHOUT PRESENCE OF ULCERS (sore throat, sores in mouth, or a toothache) UNUSUAL RASH, SWELLING OR PAIN  UNUSUAL VAGINAL DISCHARGE OR ITCHING   Items with * indicate a potential emergency and should be followed up as soon as possible or go to the Emergency Department if any problems should occur.  Please show the CHEMOTHERAPY ALERT CARD or IMMUNOTHERAPY ALERT CARD at check-in to the  Emergency Department and triage nurse.  Should you have questions after your visit or need to cancel or reschedule your appointment, please contact [facility]  Dept: [phone]  and follow the prompts.  Office hours are 8:00 a.m. to 4:30 p.m. Monday - Friday. Please note that voicemails left after 4:00 p.m. may not be returned until the following business day.  We are closed weekends and major holidays. You have access to a nurse at all times for urgent questions. Please call the main number to the clinic Dept: [phone] and follow the prompts.   For any non-urgent questions, you may also contact your provider using MyChart. We now offer e-Visits for anyone 18 and older to request care online for non-urgent symptoms. For details visit [email or web address].   Also download the MyChart app! Go to the app store, search "MyChart", open the app, select [facility], and log in with your MyChart username and password.  Due to Covid, a mask is required upon entering the hospital/clinic. If you do not have a mask, one will be given to you upon arrival. For doctor visits, patients may have 1 support Shaniqua Guillot aged 18 or older with them. For treatment visits, patients cannot have anyone with them due to current Covid guidelines and our immunocompromised population.   

## 2022-02-13 ENCOUNTER — Other Ambulatory Visit: Payer: Self-pay

## 2022-02-13 ENCOUNTER — Inpatient Hospital Stay: Payer: Medicare Other | Admitting: Nutrition

## 2022-02-13 ENCOUNTER — Inpatient Hospital Stay: Payer: Medicare Other

## 2022-02-13 VITALS — BP 124/78 | HR 66 | Temp 98.0°F | Resp 18 | Wt 122.5 lb

## 2022-02-13 DIAGNOSIS — D696 Thrombocytopenia, unspecified: Secondary | ICD-10-CM | POA: Diagnosis not present

## 2022-02-13 DIAGNOSIS — D649 Anemia, unspecified: Secondary | ICD-10-CM | POA: Diagnosis not present

## 2022-02-13 DIAGNOSIS — C931 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: Secondary | ICD-10-CM | POA: Diagnosis not present

## 2022-02-13 DIAGNOSIS — Z5111 Encounter for antineoplastic chemotherapy: Secondary | ICD-10-CM | POA: Diagnosis not present

## 2022-02-13 DIAGNOSIS — Z95828 Presence of other vascular implants and grafts: Secondary | ICD-10-CM

## 2022-02-13 LAB — CBC WITH DIFFERENTIAL/PLATELET
Abs Immature Granulocytes: 0.1 10*3/uL — ABNORMAL HIGH (ref 0.00–0.07)
Basophils Absolute: 0 10*3/uL (ref 0.0–0.1)
Basophils Relative: 0 %
Eosinophils Absolute: 0 10*3/uL (ref 0.0–0.5)
Eosinophils Relative: 0 %
HCT: 28.3 % — ABNORMAL LOW (ref 36.0–46.0)
Hemoglobin: 9.1 g/dL — ABNORMAL LOW (ref 12.0–15.0)
Immature Granulocytes: 1 %
Lymphocytes Relative: 17 %
Lymphs Abs: 2.2 10*3/uL (ref 0.7–4.0)
MCH: 35.3 pg — ABNORMAL HIGH (ref 26.0–34.0)
MCHC: 32.2 g/dL (ref 30.0–36.0)
MCV: 109.7 fL — ABNORMAL HIGH (ref 80.0–100.0)
Monocytes Absolute: 2.3 10*3/uL — ABNORMAL HIGH (ref 0.1–1.0)
Monocytes Relative: 18 %
Neutro Abs: 8.1 10*3/uL — ABNORMAL HIGH (ref 1.7–7.7)
Neutrophils Relative %: 64 %
Platelets: 77 10*3/uL — ABNORMAL LOW (ref 150–400)
RBC: 2.58 MIL/uL — ABNORMAL LOW (ref 3.87–5.11)
RDW: 14.9 % (ref 11.5–15.5)
WBC: 12.7 10*3/uL — ABNORMAL HIGH (ref 4.0–10.5)
nRBC: 0.2 % (ref 0.0–0.2)

## 2022-02-13 LAB — COMPREHENSIVE METABOLIC PANEL
ALT: 13 U/L (ref 0–44)
AST: 13 U/L — ABNORMAL LOW (ref 15–41)
Albumin: 4.1 g/dL (ref 3.5–5.0)
Alkaline Phosphatase: 41 U/L (ref 38–126)
Anion gap: 9 (ref 5–15)
BUN: 26 mg/dL — ABNORMAL HIGH (ref 8–23)
CO2: 26 mmol/L (ref 22–32)
Calcium: 10.3 mg/dL (ref 8.9–10.3)
Chloride: 99 mmol/L (ref 98–111)
Creatinine, Ser: 1.14 mg/dL — ABNORMAL HIGH (ref 0.44–1.00)
GFR, Estimated: 50 mL/min — ABNORMAL LOW (ref >60)
Glucose, Bld: 121 mg/dL — ABNORMAL HIGH (ref 70–99)
Potassium: 4.1 mmol/L (ref 3.5–5.1)
Sodium: 134 mmol/L — ABNORMAL LOW (ref 135–145)
Total Bilirubin: 1.1 mg/dL (ref 0.3–1.2)
Total Protein: 6.2 g/dL — ABNORMAL LOW (ref 6.5–8.1)

## 2022-02-13 MED ORDER — SODIUM CHLORIDE 0.9 % IV SOLN: Freq: Once | INTRAVENOUS | Status: AC

## 2022-02-13 MED ORDER — HEPARIN SOD (PORK) LOCK FLUSH 100 UNIT/ML IV SOLN
500.0000 [IU] | Freq: Once | INTRAVENOUS | Status: AC | PRN
  Administered 2022-02-13: 500 [IU]

## 2022-02-13 MED ORDER — SODIUM CHLORIDE 0.9% FLUSH
10.0000 mL | Freq: Once | INTRAVENOUS | Status: AC
  Administered 2022-02-13: 10 mL

## 2022-02-13 MED ORDER — SODIUM CHLORIDE 0.9% FLUSH
10.0000 mL | INTRAVENOUS | Status: DC | PRN
  Administered 2022-02-13: 10 mL

## 2022-02-13 MED ORDER — PALONOSETRON HCL INJECTION 0.25 MG/5ML
0.2500 mg | Freq: Once | INTRAVENOUS | Status: AC
  Administered 2022-02-13: 0.25 mg via INTRAVENOUS
  Filled 2022-02-13: qty 5

## 2022-02-13 MED ORDER — SODIUM CHLORIDE 0.9 % IV SOLN
10.0000 mg | Freq: Once | INTRAVENOUS | Status: AC
  Administered 2022-02-13: 10 mg via INTRAVENOUS
  Filled 2022-02-13: qty 10

## 2022-02-13 MED ORDER — SODIUM CHLORIDE 0.9 % IV SOLN
75.0000 mg/m2 | Freq: Once | INTRAVENOUS | Status: AC
  Administered 2022-02-13: 120 mg via INTRAVENOUS
  Filled 2022-02-13: qty 12

## 2022-02-13 MED FILL — Dexamethasone Sodium Phosphate Inj 100 MG/10ML: INTRAMUSCULAR | Qty: 1 | Status: AC

## 2022-02-13 NOTE — Progress Notes (Signed)
Per Dr.Feng ok to proceed with labs from 02/09/22 and ok to proceed with plts of 99.   Labs are back from 02/13/22 and plts are 77, per Dr.Feng ok to treat.

## 2022-02-13 NOTE — Patient Instructions (Signed)
[facility]  Discharge Instructions: Thank you for choosing [facility] to provide your oncology and hematology care.   If you have a lab appointment with the Cancer Center, please go directly to the Cancer Center and check in at the registration area.   Wear comfortable clothing and clothing appropriate for easy access to any Portacath or PICC line.   We strive to give you quality time with your provider. You may need to reschedule your appointment if you arrive late (15 or more minutes).  Arriving late affects you and other patients whose appointments are after yours.  Also, if you miss three or more appointments without notifying the office, you may be dismissed from the clinic at the provider's discretion.      For prescription refill requests, have your pharmacy contact our office and allow 72 hours for refills to be completed.   Today you received the following chemotherapy and/or immunotherapy agents Vidaza     To help prevent nausea and vomiting after your treatment, we encourage you to take your nausea medication as directed.  BELOW ARE SYMPTOMS THAT SHOULD BE REPORTED IMMEDIATELY: *FEVER GREATER THAN 100.4 F (38 C) OR HIGHER *CHILLS OR SWEATING *NAUSEA AND VOMITING THAT IS NOT CONTROLLED WITH YOUR NAUSEA MEDICATION *UNUSUAL SHORTNESS OF BREATH *UNUSUAL BRUISING OR BLEEDING *URINARY PROBLEMS (pain or burning when urinating, or frequent urination) *BOWEL PROBLEMS (unusual diarrhea, constipation, pain near the anus) TENDERNESS IN MOUTH AND THROAT WITH OR WITHOUT PRESENCE OF ULCERS (sore throat, sores in mouth, or a toothache) UNUSUAL RASH, SWELLING OR PAIN  UNUSUAL VAGINAL DISCHARGE OR ITCHING   Items with * indicate a potential emergency and should be followed up as soon as possible or go to the Emergency Department if any problems should occur.  Please show the CHEMOTHERAPY ALERT CARD or IMMUNOTHERAPY ALERT CARD at check-in to the  Emergency Department and triage nurse.  Should you have questions after your visit or need to cancel or reschedule your appointment, please contact [facility]  Dept: [phone]  and follow the prompts.  Office hours are 8:00 a.m. to 4:30 p.m. Monday - Friday. Please note that voicemails left after 4:00 p.m. may not be returned until the following business day.  We are closed weekends and major holidays. You have access to a nurse at all times for urgent questions. Please call the main number to the clinic Dept: [phone] and follow the prompts.   For any non-urgent questions, you may also contact your provider using MyChart. We now offer e-Visits for anyone 18 and older to request care online for non-urgent symptoms. For details visit [email or web address].   Also download the MyChart app! Go to the app store, search "MyChart", open the app, select [facility], and log in with your MyChart username and password.  Due to Covid, a mask is required upon entering the hospital/clinic. If you do not have a mask, one will be given to you upon arrival. For doctor visits, patients may have 1 support person aged 18 or older with them. For treatment visits, patients cannot have anyone with them due to current Covid guidelines and our immunocompromised population.   

## 2022-02-13 NOTE — Progress Notes (Signed)
Pt requested to stay accessed due to having infusion today and also tomorrow. (02/14/2022) [name] and biopatch applied.

## 2022-02-13 NOTE — Progress Notes (Signed)
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 Burr Medico.  Patient is receiving Vidaza.  Past medical history includes celiac disease, chronic kidney disease stage III, COPD, hypertension, iron deficiency anemia, and rheumatoid arthritis.  Medications include calcium with magnesium and vitamin D, Remicade, Zofran, prednisone, Compazine, magnesium oxide, and vitamin C.  Labs include sodium 133, glucose 202, BUN 43, creatinine 1.16.  Height: 5 feet 3 inches. Weight: 126.2 pounds February 13. Usual body weight: Approximately 130 pounds. BMI: 22.36.  Patient reports she has a decreased appetite.  She follows a gluten-free diet.  She loves raw vegetables and fruit.  She avoids grapefruit secondary to medication interactions.  Reports multiple loose stools today after taking 3 different types of laxatives.  She drinks 1 boost nutrition supplement daily.  She has not been as active secondary to a torn cartilage in her knee. Patient eats eggs with some type of breakfast meats such as bacon or ham, 2 fruit, and bread at breakfast.  She does not eat many meals the rest of the day.  She snacks on pork grinds and tortilla chips.  She drinks water with her meals and reports she now gets 8 ounces of water daily.  Nutrition diagnosis: Inadequate oral intake related to decreased appetite as evidenced by 4 pound weight loss from usual body weight.  Intervention: Educated on the importance of smaller more frequent meals and snacks. Continue boost oral nutrition supplement once daily. Continue 64 ounces of water daily as tolerated. Provided nutrition facts sheets on ways to increase calories and protein and poor appetite.  Provided name and phone number for questions.  Monitoring, evaluation, goals: Patient will tolerate adequate calories and protein to minimize further weight loss.  Next visit: Monday, March 13 during infusion with [facility].   **Disclaimer: This note was dictated with voice  recognition software. Similar sounding words can inadvertently be transcribed and this note may contain transcription errors which may not have been corrected upon publication of note.**

## 2022-02-14 ENCOUNTER — Other Ambulatory Visit: Payer: Self-pay | Admitting: Hematology

## 2022-02-14 ENCOUNTER — Inpatient Hospital Stay: Payer: Medicare Other

## 2022-02-14 VITALS — BP 124/85 | HR 71 | Temp 98.6°F | Resp 18

## 2022-02-14 DIAGNOSIS — C931 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: Secondary | ICD-10-CM

## 2022-02-14 DIAGNOSIS — D649 Anemia, unspecified: Secondary | ICD-10-CM | POA: Diagnosis not present

## 2022-02-14 DIAGNOSIS — Z5111 Encounter for antineoplastic chemotherapy: Secondary | ICD-10-CM | POA: Diagnosis not present

## 2022-02-14 DIAGNOSIS — D696 Thrombocytopenia, unspecified: Secondary | ICD-10-CM | POA: Diagnosis not present

## 2022-02-14 MED ORDER — HEPARIN SOD (PORK) LOCK FLUSH 100 UNIT/ML IV SOLN
500.0000 [IU] | Freq: Once | INTRAVENOUS | Status: AC | PRN
  Administered 2022-02-14: 500 [IU]

## 2022-02-14 MED ORDER — SODIUM CHLORIDE 0.9 % IV SOLN
10.0000 mg | Freq: Once | INTRAVENOUS | Status: AC
  Administered 2022-02-14: 10 mg via INTRAVENOUS
  Filled 2022-02-14: qty 10

## 2022-02-14 MED ORDER — SODIUM CHLORIDE 0.9 % IV SOLN: Freq: Once | INTRAVENOUS | Status: AC

## 2022-02-14 MED ORDER — SODIUM CHLORIDE 0.9 % IV SOLN
75.0000 mg/m2 | Freq: Once | INTRAVENOUS | Status: AC
  Administered 2022-02-14: 120 mg via INTRAVENOUS
  Filled 2022-02-14: qty 12

## 2022-02-14 MED ORDER — SODIUM CHLORIDE 0.9% FLUSH
10.0000 mL | INTRAVENOUS | Status: DC | PRN
  Administered 2022-02-14: 10 mL

## 2022-02-14 NOTE — Patient Instructions (Signed)
[facility]   Discharge Instructions: Thank you for choosing [facility] to provide your oncology and hematology care.   If you have a lab appointment with the [facility], please go directly to the [facility] and check in at the registration area.   Wear comfortable clothing and clothing appropriate for easy access to any Portacath or PICC line.   We strive to give you quality time with your provider. You may need to reschedule your appointment if you arrive late (15 or more minutes).  Arriving late affects you and other patients whose appointments are after yours.  Also, if you miss three or more appointments without notifying the office, you may be dismissed from the clinic at the providers discretion.      For prescription refill requests, have your pharmacy contact our office and allow 72 hours for refills to be completed.    Today you received the following chemotherapy and/or immunotherapy agents: Azacitidine (Vidaza)      To help prevent nausea and vomiting after your treatment, we encourage you to take your nausea medication as directed.  BELOW ARE SYMPTOMS THAT SHOULD BE REPORTED IMMEDIATELY: *FEVER GREATER THAN 100.4 F (38 C) OR HIGHER *CHILLS OR SWEATING *NAUSEA AND VOMITING THAT IS NOT CONTROLLED WITH YOUR NAUSEA MEDICATION *UNUSUAL SHORTNESS OF BREATH *UNUSUAL BRUISING OR BLEEDING *URINARY PROBLEMS (pain or burning when urinating, or frequent urination) *BOWEL PROBLEMS (unusual diarrhea, constipation, pain near the anus) TENDERNESS IN MOUTH AND THROAT WITH OR WITHOUT PRESENCE OF ULCERS (sore throat, sores in mouth, or a toothache) UNUSUAL RASH, SWELLING OR PAIN  UNUSUAL VAGINAL DISCHARGE OR ITCHING   Items with * indicate a potential emergency and should be followed up as soon as possible or go to the Emergency Department if any problems should occur.  Please show the CHEMOTHERAPY ALERT CARD or IMMUNOTHERAPY ALERT CARD at  check-in to the Emergency Department and triage nurse.  Should you have questions after your visit or need to cancel or reschedule your appointment, please contact [facility]  Dept: [phone]  and follow the prompts.  Office hours are 8:00 a.m. to 4:30 p.m. Monday - Friday. Please note that voicemails left after 4:00 p.m. may not be returned until the following business day.  We are closed weekends and major holidays. You have access to a nurse at all times for urgent questions. Please call the main number to the clinic Dept: [phone] and follow the prompts.   For any non-urgent questions, you may also contact your provider using MyChart. We now offer e-Visits for anyone 70 and older to request care online for non-urgent symptoms. For details visit [email or web address].   Also download the MyChart app! Go to the app store, search "MyChart", open the app, select [facility], and log in with your MyChart username and password.  Due to Covid, a mask is required upon entering the hospital/clinic. If you do not have a mask, one will be given to you upon arrival. For doctor visits, patients may have 1 support person aged 28 or older with them. For treatment visits, patients cannot have anyone with them due to current Covid guidelines and our immunocompromised population.

## 2022-02-15 ENCOUNTER — Encounter: Payer: Self-pay | Admitting: Hematology

## 2022-02-19 NOTE — Progress Notes (Addendum)
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  Please see concurrent tissue biopsy (below) for additional work-up and final diagnosis.    FINAL MICROSCOPIC DIAGNOSIS:   A. SOFT TISSUE MASS, PRE SACRAL, NEEDLE CORE BIOPSY:  -  Chronic lymphocytic leukemia/small lymphocytic lymphoma  -  Extra medullary hematopoiesis  -  See comment   COMMENT:  The biopsy consists of multiple soft tissue cores with lymphoid nodules and a dense hematopoietic infiltrate consistent with extra medullary hematopoiesis.  MPO and E-cadherin highlight myeloid and erythroid precursors respectively.  CD34 highlights increased vasculature and is also positive within the cytoplasm of megakaryocytes.  A few small, immature mononuclear cells appear to be positive for CD34 and CD117. TdT shows rare, scattered positive cells.  CD20 highlights aggregates of B cells which are admixed with CD3 positive T cells.  T cells are an admixture of CD4 and CD8.  The B cells are also positive for CD5, CD23 and Bcl-2.  The B cells do not show significant  staining for CD10, BCL6 or cyclin D1.  CD138 highlights scattered plasma cells which are polytypic by kappa and lambda in situ hybridization.  Flow cytometry performed on the sample (see WL [id number]) identified a kappa restricted CD5 positive B-cell population comprising 70% of lymphocytes.  In addition, a small myeloblast population comprised 2% of the total cellular events.   Overall, the findings are consistent with soft tissue involvement by  chronic lymphocytic leukemia/small lymphocytic lymphoma and extra medullary hematopoiesis. In reviewing the patient's CBC data (macrocytic anemia and thrombocytopenia), I would recommend a bone marrow biopsy to assess for marrow involvement by CLL/SLL.    12/23/2021 Imaging   EXAM: CT CHEST, ABDOMEN, AND PELVIS WITH CONTRAST  IMPRESSION: 1. Slight interval enlargement of a presacral soft tissue mass measuring 7.2 x 4.7 cm, previously 6.9 x 4.1 cm on prior MR of the pelvis dated 07/04/2021. By report, this represents a biopsy proven lymphoma. 2. Pleural nodule of the dependent right lower lobe overlying the posterior right tenth rib and pleural or paraspinous soft tissue mass overlying the right aspect of the T10 vertebral body, very slightly enlarged compared to prior examination of the chest dated 06/25/2020, consistent with additional sites of lymphomatous involvement given very indolent growth. These could be better assessed for metabolic activity by FDG PET/CT if desired. 3. There is mild, bibasilar predominant pulmonary fibrosis in a pattern featuring irregular peripheral interstitial opacity, septal thickening, but without clear evidence of subpleural bronchiolectasis or honeycombing, with a somewhat asymmetric distribution most conspicuously involving the right lower lobe and lingula. These findings are significantly worsened when compared to prior examination dated 06/25/2020, particularly in the right lower lobe. Given  interval change,  this may reflect sequelae of interval infection or aspiration, however appearance is generally suspicious for fibrotic interstitial lung disease, and if characterized by ATS pulmonary fibrosis is in an "indeterminate for UIP" pattern, differential considerations including both UIP and NSIP. 4. Emphysema.   Aortic Atherosclerosis (ICD10-I70.0) and Emphysema (ICD10-J43.9).   01/05/2022 Pathology Results   DIAGNOSIS:   BONE MARROW, ASPIRATE, CLOT, CORE:  -Hypercellular bone marrow for age with features of  myelodysplastic/myeloproliferative neoplasm  -Minor abnormal B-cell population  -See comment   PERIPHERAL BLOOD:  -Macrocytic anemia  -Neutrophilic left shift and monocytosis  -Thrombocytopenia   COMMENT:  The bone marrow is hypercellular for age with dyspoietic changes  involving myeloid cell lines associated with monocytosis and increased number of blastic cells (12%) as primarily seen by morphology, many of which display monocytic features.  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  In this background, there are several predominantly small lymphoid aggregates mostly composed of small lymphoid cells.  By flow cytometry, a minor abnormal B-cell population expressing CD5 is seen and representing 2% of all cells.  This correlate with previously known B-cell lymphoproliferative process.  Correlation with cytogenetic and FISH studies is strongly recommended.    DIAGNOSIS:   -Increased number of monocytic cells present (25%)  -Minor abnormal B-cell population identified.  -See comment   COMMENT:  Flow cytometric analysis shows increased number of monocytic cells representing 25% of all cells but without aberrant phenotype or CD34 expression.  A significant CD34-positive blastic population is not identified.  The  lymphoid population shows a minor B-cell population representing 2% of all cells and expressing B-cell antigens including CD20 associated with CD5, CD200 and possibly dim kappa expression.  The latter findings are abnormal and correlate with previously known B-cell lymphoproliferative process.  No significant T-cell phenotypic abnormalities identified.    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 Last seen by Dr. Burr Medico 02/06/22 and began vidaza 2/13 - 2/17, 2/20 and 2/21. She is here for follow up after treatment.  She developed abdominal cramps 10 days ago that were debilitating at first, she was also not having BMs at the time.  She took laxatives as instructed, now on 2 Colace per day with 1-2 BMs daily.  Cramps have improved but not resolved.  She has not taking anything for it.  She is eating and drinking, remains up and active at home.  She has low energy but does not feel weak, breathing is labored at times.  Denies worsening bruising/bleeding.  Denies dizziness, lightheadedness, dyspnea cough, chest pain, fever, chills, or any other new specific complaints.  She notes she had a colonoscopy a couple weeks ago that noted a "blockage," she is scheduled for repeat colonoscopy 3/6 by Dr. Collene Mares.   MEDICAL HISTORY:  Past Medical  History:  Diagnosis Date   Arthritis    Rheumatoid arthritis   Celiac  disease    Chronic kidney disease    stage 3 from MD notes   COPD (chronic obstructive pulmonary disease) ([facility])    Dyspnea    with going up stairs   Family history of adverse reaction to anesthesia    father had hard time waking up   Headache    sinus headaches   Hot flashes    Hypertension    Iron deficiency anemia    Pneumonia    per patient "I have walking pneumonia"    SURGICAL HISTORY: Past Surgical History:  Procedure Laterality Date   COLONOSCOPY     ECTOPIC PREGNANCY SURGERY      x 2   IR IMAGING GUIDED PORT INSERTION  01/31/2022   REVERSE SHOULDER ARTHROPLASTY Right 02/02/2017   Procedure: RIGHT REVERSE SHOULDER ARTHROPLASTY;  Surgeon: Netta Cedars, MD;  Location: [facility];  Service: Orthopedics;  Laterality: Right;    I have reviewed the social history and family history with the patient and they are unchanged from previous note.  ALLERGIES:  is allergic to gluten meal, penicillins, fosamax [alendronate], and hydroxychloroquine.  MEDICATIONS:  Current Outpatient Medications  Medication Sig Dispense Refill   apixaban (ELIQUIS) 5 MG TABS tablet Take 1 tablet (5 mg total) by mouth 2 (two) times daily. 60 tablet 11   denosumab (PROLIA) 60 MG/ML SOSY injection Inject 60 mg into the skin every 6 (six) months.     dicyclomine (BENTYL) 10 MG capsule Take 1 capsule (10 mg total) by mouth 3 times/day as needed-between meals & bedtime for spasms. 30 capsule 0   gabapentin (NEURONTIN) 300 MG capsule Take 300 mg by mouth 3 (three) times daily.      InFLIXimab (REMICADE IV) Inject 10 mg into the vein every 8 (eight) weeks.     losartan (COZAAR) 50 MG tablet Take 50 mg by mouth daily.      metoprolol succinate (TOPROL-XL) 50 MG 24 hr tablet Take 50 mg by mouth every morning.      predniSONE (DELTASONE) 5 MG tablet Take 5 mg by mouth daily. TAKE 1 TABLET ORALLY ONCE A DAY FOR 30 DAYS     Tiotropium Bromide-Olodaterol (STIOLTO RESPIMAT) 2.5-2.5 MCG/ACT AERS Inhale 2 puffs into the lungs  daily. 4 g 11   vitamin C (ASCORBIC ACID) 500 MG tablet Take 500 mg by mouth daily.     acetaminophen (TYLENOL) 500 MG tablet Take 1,000 mg by mouth every 6 (six) hours as needed for mild pain.     acetaminophen-codeine (TYLENOL #3) 300-30 MG tablet Take 1 tablet by mouth every 6 (six) hours as needed for moderate pain. 15 tablet 0   Calcium-Magnesium-Vitamin D (CALCIUM 1200+D3 PO) Take 1 tablet by mouth daily.     lidocaine-prilocaine (EMLA) cream Apply to affected area once 30 g 3   ondansetron (ZOFRAN) 8 MG tablet Take 1 tablet (8 mg total) by mouth 2 (two) times daily as needed for refractory nausea / vomiting. Start on the third day after last day of chemotherapy. 30 tablet 1   prochlorperazine (COMPAZINE) 10 MG tablet Take 1 tablet (10 mg total) by mouth every 6 (six) hours as needed (Nausea or vomiting). 30 tablet 1   Sod Picosulfate-Mag Ox-Cit Acd (CLENPIQ) 10-3.5-12 MG-GM -GM/160ML SOLN 160 mL orally twice for 2     No current facility-administered medications for this visit.    PHYSICAL EXAMINATION: ECOG PERFORMANCE STATUS: 1 -  Symptomatic but completely ambulatory  Vitals:   02/20/22 1116  BP: 94/69  Pulse: 100  Resp: 17  Temp: 98.2 F (36.8 C)  SpO2: 100%   Filed Weights   02/20/22 1116  Weight: 124 lb 7 oz (56.4 kg)    GENERAL:alert, no distress and comfortable SKIN: No rash EYES: sclera clear NECK: sup without mass LUNGS: clear with normal breathing effort HEART: regular rate & rhythm, no lower extremity edema ABDOMEN: abdomen soft, non-tender and normal bowel sounds NEURO: alert & oriented x 3 with fluent speech, no focal motor/sensory deficits PAC site closed, healing with dried blood  LABORATORY DATA:  I have reviewed the data as listed CBC Latest Ref Rng & Units 02/20/2022 02/13/2022 02/09/2022  WBC 4.0 - 10.5 K/uL 4.2 12.7(H) 12.1(H)  Hemoglobin 12.0 - 15.0 g/dL 7.6(L) 9.1(L) 8.7(L)  Hematocrit 36.0 - 46.0 % 23.4(L) 28.3(L) 27.4(L)  Platelets 150 - 400  K/uL 37(L) 77(L) 99(L)     CMP Latest Ref Rng & Units 02/20/2022 02/13/2022 02/09/2022  Glucose 70 - 99 mg/dL 121(H) 121(H) 202(H)  BUN 8 - 23 mg/dL 19 26(H) 43(H)  Creatinine 0.44 - 1.00 mg/dL 0.94 1.14(H) 1.16(H)  Sodium 135 - 145 mmol/L 133(L) 134(L) 133(L)  Potassium 3.5 - 5.1 mmol/L 4.7 4.1 3.8  Chloride 98 - 111 mmol/L 99 99 100  CO2 22 - 32 mmol/L 26 26 25   Calcium 8.9 - 10.3 mg/dL 9.6 10.3 9.1  Total Protein 6.5 - 8.1 g/dL 6.1(L) 6.2(L) 6.6  Total Bilirubin 0.3 - 1.2 mg/dL 0.7 1.1 0.6  Alkaline Phos 38 - 126 U/L 36(L) 41 36(L)  AST 15 - 41 U/L 15 13(L) 20  ALT 0 - 44 U/L 12 13 16       RADIOGRAPHIC STUDIES: I have personally reviewed the radiological images as listed and agreed with the findings in the report. No results found.   ASSESSMENT & PLAN: [name] is a 76 y.o. female with    1. 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 SPEP and UPEP with immunofixation were negative. No lab evidence of hemolysis, erythropoietin level is normal.  -Her prior bone marrow biopsy in 2016 was negative except several small lymphoid aggregation, suspicious for low-grade B cell lymphoproliferative process, especially SLL. Her peripheral white count has been normal, no elevated lymphocytes -CT scan from 03/2015 was negative for adenopathy or splenomegaly. no B symptoms  -she has been under observation -She developed worsening back pain in Summer 2022 and underwent work up, pelvic MRI 07/04/21 showed a mass in the presacral space. -bone marrow biopsy on 01/05/22 showed hypercellular marrow with features of myelodysplastic/myeloproliferative neoplasm, increased 12% blasts, most consistent with CMML-2. Her BM biopsy was reviewed at [facility] and was felt to be CMML-1 with 5% blasts. Her previous presacral soft tissue biopsy which showed same CMML as her bone marrow biopsy. -she met Dr. Linus Orn at  [facility] on 01/26/22. Vidaza was also recommended  -She began azacitadine daily for 7 days (5+2) every 28 days, starting 02/06/22   2. Symptom management: abdominal cramps, anemia, thrombocytopenia -She developed abdominal cramps during the first week of by Daza, debilitating at first when not having BMs. -She corrected constipation, cramps have improved but not resolved. -Continue symptom management with Tylenol, heat, and start Bentyl as needed.  Avoid NSAIDs due to thrombocytopenia -She has worsening cytopenias after starting treatment, today's Hgb 7.6, platelet 37 K.  She is mildly symptomatic of anemia with fatigue -We will transfuse 1 unit irradiated red blood cells 2/28 -Repeat CBC 3/2 to see if she needs additional blood or platelets over the weekend  3. Rheumatoid arthritis -on Remicade q8 weeks per rheumatologist Dr. Trudie Reed. We reviewed the risk of lymphoma and immunosupression on Remicade -I will cc'd my note to Dr. Trudie Reed to consider an alternative given the new diagnosis  -she continues remidcade -She takes vitamin supplements and gets prolia injection q71month with her PCP.   4. COPD, AF, HTN -on resp meds and eliquis; continue per PCP  Disposition:  Ms. SSandallappears stable. She began weekly vidaza on 02/06/22, tolerating well thus far except abdominal cramps. We reviewed symptom management with tylenol, heat, and bentyl PRN. She is able to recover and function well at home.   Labs reviewed, she has worsening anemia Hgb 7.6 and thrombocytopenia plt 37K. I recommend 1 unit irradiated RBCs this week, reviewed potential risk, side effect, and benefit; she agrees to proceed and consented.  No evidence of TLS.  We will repeat CBC later this week to see if she needs additional transfusions over the weekend, then lab 3/6 as scheduled.  Follow-up with Dr. FBurr Medico3/13 prior to cycle 2.  I will CC my note to Dr. MCollene Maresre: repeat colonoscopy   Orders Placed This Encounter  Procedures   Uric  acid    Standing Status:   Standing    Number of Occurrences:   1    Standing Expiration Date:   02/20/2023   Phosphorus    Standing Status:   Standing    Number of Occurrences:   1    Standing Expiration Date:   02/20/2023   Informed Consent Details: Physician/Practitioner Attestation; Transcribe to consent form and obtain patient signature    Standing Status:   Future    Standing Expiration Date:   02/20/2023    Order Specific Question:   Physician/Practitioner attestation of informed consent for blood and or blood product transfusion    Answer:   I, the physician/practitioner, attest that I have discussed with the patient the benefits, risks, side effects, alternatives, likelihood of achieving goals and potential problems during recovery for the procedure that I have provided informed consent.    Order Specific Question:   Product(s)    Answer:   All Product(s)   Care order/instruction    Transfuse Parameters    Standing Status:   Future    Standing Expiration Date:   02/20/2023   Type and screen         Standing Status:   Future    Number of Occurrences:   1    Standing Expiration Date:   02/20/2023   All questions were answered. The patient knows to call the clinic with any problems, questions or concerns. No barriers to learning were detected.      LAlla Feeling NP 02/20/22

## 2022-02-20 ENCOUNTER — Encounter: Payer: Self-pay | Admitting: Nurse Practitioner

## 2022-02-20 ENCOUNTER — Other Ambulatory Visit: Payer: Self-pay

## 2022-02-20 ENCOUNTER — Inpatient Hospital Stay (HOSPITAL_BASED_OUTPATIENT_CLINIC_OR_DEPARTMENT_OTHER): Payer: Medicare Other | Admitting: Nurse Practitioner

## 2022-02-20 ENCOUNTER — Inpatient Hospital Stay: Payer: Medicare Other

## 2022-02-20 VITALS — BP 94/69 | HR 100 | Temp 98.2°F | Resp 17 | Wt 124.4 lb

## 2022-02-20 DIAGNOSIS — C931 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: Secondary | ICD-10-CM

## 2022-02-20 DIAGNOSIS — Z95828 Presence of other vascular implants and grafts: Secondary | ICD-10-CM

## 2022-02-20 DIAGNOSIS — D649 Anemia, unspecified: Secondary | ICD-10-CM | POA: Diagnosis not present

## 2022-02-20 DIAGNOSIS — D696 Thrombocytopenia, unspecified: Secondary | ICD-10-CM | POA: Diagnosis not present

## 2022-02-20 DIAGNOSIS — Z5111 Encounter for antineoplastic chemotherapy: Secondary | ICD-10-CM | POA: Diagnosis not present

## 2022-02-20 LAB — CBC WITH DIFFERENTIAL/PLATELET
Abs Immature Granulocytes: 0.06 10*3/uL (ref 0.00–0.07)
Basophils Absolute: 0 10*3/uL (ref 0.0–0.1)
Basophils Relative: 0 %
Eosinophils Absolute: 0 10*3/uL (ref 0.0–0.5)
Eosinophils Relative: 0 %
HCT: 23.4 % — ABNORMAL LOW (ref 36.0–46.0)
Hemoglobin: 7.6 g/dL — ABNORMAL LOW (ref 12.0–15.0)
Immature Granulocytes: 1 %
Lymphocytes Relative: 21 %
Lymphs Abs: 0.9 10*3/uL (ref 0.7–4.0)
MCH: 36.2 pg — ABNORMAL HIGH (ref 26.0–34.0)
MCHC: 32.5 g/dL (ref 30.0–36.0)
MCV: 111.4 fL — ABNORMAL HIGH (ref 80.0–100.0)
Monocytes Absolute: 0.6 10*3/uL (ref 0.1–1.0)
Monocytes Relative: 14 %
Neutro Abs: 2.6 10*3/uL (ref 1.7–7.7)
Neutrophils Relative %: 64 %
Platelets: 37 10*3/uL — ABNORMAL LOW (ref 150–400)
RBC: 2.1 MIL/uL — ABNORMAL LOW (ref 3.87–5.11)
RDW: 15 % (ref 11.5–15.5)
WBC: 4.2 10*3/uL (ref 4.0–10.5)
nRBC: 0 % (ref 0.0–0.2)

## 2022-02-20 LAB — URIC ACID: Uric Acid, Serum: 6.5 mg/dL (ref 2.5–7.1)

## 2022-02-20 LAB — COMPREHENSIVE METABOLIC PANEL
ALT: 12 U/L (ref 0–44)
AST: 15 U/L (ref 15–41)
Albumin: 3.9 g/dL (ref 3.5–5.0)
Alkaline Phosphatase: 36 U/L — ABNORMAL LOW (ref 38–126)
Anion gap: 8 (ref 5–15)
BUN: 19 mg/dL (ref 8–23)
CO2: 26 mmol/L (ref 22–32)
Calcium: 9.6 mg/dL (ref 8.9–10.3)
Chloride: 99 mmol/L (ref 98–111)
Creatinine, Ser: 0.94 mg/dL (ref 0.44–1.00)
GFR, Estimated: >60 mL/min (ref >60)
Glucose, Bld: 121 mg/dL — ABNORMAL HIGH (ref 70–99)
Potassium: 4.7 mmol/L (ref 3.5–5.1)
Sodium: 133 mmol/L — ABNORMAL LOW (ref 135–145)
Total Bilirubin: 0.7 mg/dL (ref 0.3–1.2)
Total Protein: 6.1 g/dL — ABNORMAL LOW (ref 6.5–8.1)

## 2022-02-20 LAB — PHOSPHORUS: Phosphorus: 3.6 mg/dL (ref 2.5–4.6)

## 2022-02-20 LAB — ABO/RH: ABO/RH(D): B POS

## 2022-02-20 MED ORDER — HEPARIN SOD (PORK) LOCK FLUSH 100 UNIT/ML IV SOLN
500.0000 [IU] | INTRAVENOUS | Status: AC | PRN
  Administered 2022-02-20: 500 [IU]

## 2022-02-20 MED ORDER — SODIUM CHLORIDE 0.9% FLUSH
10.0000 mL | Freq: Once | INTRAVENOUS | Status: AC
  Administered 2022-02-20: 10 mL

## 2022-02-20 MED ORDER — DICYCLOMINE HCL 10 MG PO CAPS: 10.0000 mg | ORAL_CAPSULE | Freq: Two times a day (BID) | ORAL | 0 refills | Status: DC | PRN

## 2022-02-21 ENCOUNTER — Telehealth: Payer: Self-pay | Admitting: Hematology

## 2022-02-21 ENCOUNTER — Inpatient Hospital Stay: Payer: Medicare Other

## 2022-02-21 DIAGNOSIS — C931 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: Secondary | ICD-10-CM | POA: Diagnosis not present

## 2022-02-21 DIAGNOSIS — D696 Thrombocytopenia, unspecified: Secondary | ICD-10-CM | POA: Diagnosis not present

## 2022-02-21 DIAGNOSIS — Z5111 Encounter for antineoplastic chemotherapy: Secondary | ICD-10-CM | POA: Diagnosis not present

## 2022-02-21 DIAGNOSIS — D649 Anemia, unspecified: Secondary | ICD-10-CM | POA: Diagnosis not present

## 2022-02-21 MED ORDER — SODIUM CHLORIDE 0.9% FLUSH
10.0000 mL | INTRAVENOUS | Status: AC | PRN
  Administered 2022-02-21: 10 mL

## 2022-02-21 MED ORDER — SODIUM CHLORIDE 0.9% IV SOLUTION
250.0000 mL | Freq: Once | INTRAVENOUS | Status: AC
  Administered 2022-02-21: 250 mL via INTRAVENOUS

## 2022-02-21 MED ORDER — HEPARIN SOD (PORK) LOCK FLUSH 100 UNIT/ML IV SOLN
500.0000 [IU] | Freq: Every day | INTRAVENOUS | Status: AC | PRN
  Administered 2022-02-21: 500 [IU]

## 2022-02-21 NOTE — Telephone Encounter (Signed)
Scheduled follow-up appointments per 2/27 los. Patient is aware.

## 2022-02-21 NOTE — Patient Instructions (Signed)
Blood Transfusion, Adult A blood transfusion is a procedure in which you receive blood or a type of blood cell (blood component) through an IV. You may need a blood transfusion when your blood level is low. This may result from a bleeding disorder, illness, injury, or surgery. The blood may come from a donor. You may also be able to donate blood for yourself (autologous blood donation) before a planned surgery. The blood given in a transfusion is made up of different blood components. You may receive: Red blood cells. These carry oxygen to the cells in the body. Platelets. These help your blood to clot. Plasma. This is the liquid part of your blood. It carries proteins and other substances throughout the body. White blood cells. These help you fight infections. If you have hemophilia or another clotting disorder, you may also receive other types of blood products. Tell a health care provider about: Any blood disorders you have. Any previous reactions you have had during a blood transfusion. Any allergies you have. All medicines you are taking, including vitamins, herbs, eye drops, creams, and over-the-counter medicines. Any surgeries you have had. Any medical conditions you have, including any recent fever or cold symptoms. Whether you are pregnant or may be pregnant. What are the risks? Generally, this is a safe procedure. However, problems may occur. The most common problems include: A mild allergic reaction, such as red, swollen areas of skin (hives) and itching. Fever or chills. This may be the body's response to new blood cells received. This may occur during or up to 4 hours after the transfusion. More serious problems may include: Transfusion-associated circulatory overload (TACO), or too much fluid in the lungs. This may cause breathing problems. A serious allergic reaction, such as difficulty breathing or swelling around the face and lips. Transfusion-related acute lung injury  (TRALI), which causes breathing difficulty and low oxygen in the blood. This can occur within hours of the transfusion or several days later. Iron overload. This can happen after receiving many blood transfusions over a period of time. Infection or virus being transmitted. This is rare because donated blood is carefully tested before it is given. Hemolytic transfusion reaction. This is rare. It happens when your body's defense system (immune system)tries to attack the new blood cells. Symptoms may include fever, chills, nausea, low blood pressure, and low back or chest pain. Transfusion-associated graft-versus-host disease (TAGVHD). This is rare. It happens when donated cells attack your body's healthy tissues. What happens before the procedure? Medicines Ask your health care provider about: Changing or stopping your regular medicines. This is especially important if you are taking diabetes medicines or blood thinners. Taking medicines such as aspirin and ibuprofen. These medicines can thin your blood. Do not take these medicines unless your health care provider tells you to take them. Taking over-the-counter medicines, vitamins, herbs, and supplements. General instructions Follow instructions from your health care provider about eating and drinking restrictions. You will have a blood test to determine your blood type. This is necessary to know what kind of blood your body will accept and to match it to the donor blood. If you are going to have a planned surgery, you may be able to do an autologous blood donation. This may be done in case you need to have a transfusion. You will have your temperature, blood pressure, and pulse monitored before the transfusion. If you have had an allergic reaction to a transfusion in the past, you may be given medicine to help prevent  a reaction. This medicine may be given to you by mouth (orally) or through an IV. Set aside time for the blood transfusion. This  procedure generally takes 1-4 hours to complete. What happens during the procedure?  An IV will be inserted into one of your veins. The bag of donated blood will be attached to your IV. The blood will then enter through your vein. Your temperature, blood pressure, and pulse will be monitored regularly during the transfusion. This monitoring is done to detect early signs of a transfusion reaction. Tell your nurse right away if you have any of these symptoms during the transfusion: Shortness of breath or trouble breathing. Chest or back pain. Fever or chills. Hives or itching. If you have any signs or symptoms of a reaction, your transfusion will be stopped and you may be given medicine. When the transfusion is complete, your IV will be removed. Pressure may be applied to the IV site for a few minutes. A bandage (dressing)will be applied. The procedure may vary among health care providers and hospitals. What happens after the procedure? Your temperature, blood pressure, pulse, breathing rate, and blood oxygen level will be monitored until you leave the hospital or clinic. Your blood may be tested to see how you are responding to the transfusion. You may be warmed with fluids or blankets to maintain a normal body temperature. If you receive your blood transfusion in an outpatient setting, you will be told whom to contact to report any reactions. Where to find more information For more information on blood transfusions, visit the [employer]: redcross.org Summary A blood transfusion is a procedure in which you receive blood or a type of blood cell (blood component) through an IV. The blood you receive may come from a donor or be donated by yourself (autologous blood donation) before a planned surgery. The blood given in a transfusion is made up of different blood components. You may receive red blood cells, platelets, plasma, or white blood cells depending on the condition treated. Your  temperature, blood pressure, and pulse will be monitored before, during, and after the transfusion. After the transfusion, your blood may be tested to see how your body has responded. This information is not intended to replace advice given to you by your health care provider. Make sure you discuss any questions you have with your health care provider. Document Revised: 10/16/2019 Document Reviewed: 06/05/2019 Elsevier Patient Education  [place].

## 2022-02-22 ENCOUNTER — Telehealth: Payer: Self-pay

## 2022-02-22 ENCOUNTER — Other Ambulatory Visit: Payer: Self-pay

## 2022-02-22 LAB — TYPE AND SCREEN
ABO/RH(D): B POS
Antibody Screen: NEGATIVE
Unit division: 0
Unit division: 0

## 2022-02-22 LAB — BPAM RBC
Blood Product Expiration Date: 202303182359
Blood Product Expiration Date: 202303182359
ISSUE DATE / TIME: 202302280506
ISSUE DATE / TIME: 202302280906
Unit Type and Rh: 7300
Unit Type and Rh: 7300

## 2022-02-22 NOTE — Telephone Encounter (Signed)
This nurse received message from patient stating that at last visit the provider told her that she should not do her colonoscopy however she still sees it on her schedule and she just needs to be sure on what to do.  This nurse advised patient that the provider has reached out to Dr. Collene Mares who placed the order for the Colonoscopy for collaboration about the exam.  Advised that this nurse will return call to her with an update from the provider.  Patient acknowledged understanding.   ?

## 2022-02-23 ENCOUNTER — Telehealth: Payer: Self-pay

## 2022-02-23 ENCOUNTER — Other Ambulatory Visit: Payer: Self-pay

## 2022-02-23 ENCOUNTER — Inpatient Hospital Stay: Payer: Medicare Other | Attending: Nurse Practitioner

## 2022-02-23 DIAGNOSIS — Z95828 Presence of other vascular implants and grafts: Secondary | ICD-10-CM

## 2022-02-23 DIAGNOSIS — C931 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: Secondary | ICD-10-CM | POA: Insufficient documentation

## 2022-02-23 DIAGNOSIS — Z5111 Encounter for antineoplastic chemotherapy: Secondary | ICD-10-CM | POA: Insufficient documentation

## 2022-02-23 LAB — CBC WITH DIFFERENTIAL/PLATELET
Abs Immature Granulocytes: 0.04 10*3/uL (ref 0.00–0.07)
Basophils Absolute: 0 10*3/uL (ref 0.0–0.1)
Basophils Relative: 0 %
Eosinophils Absolute: 0 10*3/uL (ref 0.0–0.5)
Eosinophils Relative: 0 %
HCT: 28.7 % — ABNORMAL LOW (ref 36.0–46.0)
Hemoglobin: 9.3 g/dL — ABNORMAL LOW (ref 12.0–15.0)
Immature Granulocytes: 1 %
Lymphocytes Relative: 19 %
Lymphs Abs: 0.9 10*3/uL (ref 0.7–4.0)
MCH: 34.6 pg — ABNORMAL HIGH (ref 26.0–34.0)
MCHC: 32.4 g/dL (ref 30.0–36.0)
MCV: 106.7 fL — ABNORMAL HIGH (ref 80.0–100.0)
Monocytes Absolute: 0.9 10*3/uL (ref 0.1–1.0)
Monocytes Relative: 20 %
Neutro Abs: 2.7 10*3/uL (ref 1.7–7.7)
Neutrophils Relative %: 60 %
Platelets: 43 10*3/uL — ABNORMAL LOW (ref 150–400)
RBC: 2.69 MIL/uL — ABNORMAL LOW (ref 3.87–5.11)
RDW: 18.9 % — ABNORMAL HIGH (ref 11.5–15.5)
WBC: 4.6 10*3/uL (ref 4.0–10.5)
nRBC: 0 % (ref 0.0–0.2)

## 2022-02-23 MED ORDER — HEPARIN SOD (PORK) LOCK FLUSH 100 UNIT/ML IV SOLN
500.0000 [IU] | Freq: Once | INTRAVENOUS | Status: AC
  Administered 2022-02-23: 500 [IU] via INTRAVENOUS

## 2022-02-23 MED ORDER — SODIUM CHLORIDE 0.9% FLUSH
10.0000 mL | Freq: Once | INTRAVENOUS | Status: AC
  Administered 2022-02-23: 10 mL

## 2022-02-23 NOTE — Telephone Encounter (Signed)
Attempted to reach patient related to colonoscopy appointment.  No answer. Will attempt to reach out again. ?

## 2022-02-23 NOTE — Telephone Encounter (Signed)
This nurse reached out to Dr. Lorie Apley office related to patients scheduled Colonoscopy.  Was advised  to leave a message for the medical assistant.  Left a message as well as faxed over last office visit notes for this patient.  The nurse will follow up and update patient.  No further concerns at this time.   ?

## 2022-02-27 ENCOUNTER — Ambulatory Visit: Payer: Medicare Other

## 2022-02-27 ENCOUNTER — Inpatient Hospital Stay: Payer: Medicare Other

## 2022-02-27 ENCOUNTER — Other Ambulatory Visit: Payer: Self-pay

## 2022-02-27 DIAGNOSIS — Z5111 Encounter for antineoplastic chemotherapy: Secondary | ICD-10-CM | POA: Diagnosis not present

## 2022-02-27 DIAGNOSIS — C931 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: Secondary | ICD-10-CM

## 2022-02-27 DIAGNOSIS — Z95828 Presence of other vascular implants and grafts: Secondary | ICD-10-CM

## 2022-02-27 LAB — CBC WITH DIFFERENTIAL/PLATELET
Abs Immature Granulocytes: 0.02 10*3/uL (ref 0.00–0.07)
Basophils Absolute: 0 10*3/uL (ref 0.0–0.1)
Basophils Relative: 0 %
Eosinophils Absolute: 0 10*3/uL (ref 0.0–0.5)
Eosinophils Relative: 0 %
HCT: 28.7 % — ABNORMAL LOW (ref 36.0–46.0)
Hemoglobin: 9.2 g/dL — ABNORMAL LOW (ref 12.0–15.0)
Immature Granulocytes: 1 %
Lymphocytes Relative: 27 %
Lymphs Abs: 1.1 10*3/uL (ref 0.7–4.0)
MCH: 34.6 pg — ABNORMAL HIGH (ref 26.0–34.0)
MCHC: 32.1 g/dL (ref 30.0–36.0)
MCV: 107.9 fL — ABNORMAL HIGH (ref 80.0–100.0)
Monocytes Absolute: 0.5 10*3/uL (ref 0.1–1.0)
Monocytes Relative: 12 %
Neutro Abs: 2.3 10*3/uL (ref 1.7–7.7)
Neutrophils Relative %: 60 %
Platelets: 27 10*3/uL — ABNORMAL LOW (ref 150–400)
RBC: 2.66 MIL/uL — ABNORMAL LOW (ref 3.87–5.11)
RDW: 17 % — ABNORMAL HIGH (ref 11.5–15.5)
WBC: 3.9 10*3/uL — ABNORMAL LOW (ref 4.0–10.5)
nRBC: 0 % (ref 0.0–0.2)

## 2022-02-27 LAB — COMPREHENSIVE METABOLIC PANEL
ALT: 15 U/L (ref 0–44)
AST: 16 U/L (ref 15–41)
Albumin: 4.1 g/dL (ref 3.5–5.0)
Alkaline Phosphatase: 39 U/L (ref 38–126)
Anion gap: 10 (ref 5–15)
BUN: 16 mg/dL (ref 8–23)
CO2: 24 mmol/L (ref 22–32)
Calcium: 10 mg/dL (ref 8.9–10.3)
Chloride: 100 mmol/L (ref 98–111)
Creatinine, Ser: 1.06 mg/dL — ABNORMAL HIGH (ref 0.44–1.00)
GFR, Estimated: 55 mL/min — ABNORMAL LOW (ref >60)
Glucose, Bld: 173 mg/dL — ABNORMAL HIGH (ref 70–99)
Potassium: 4.3 mmol/L (ref 3.5–5.1)
Sodium: 134 mmol/L — ABNORMAL LOW (ref 135–145)
Total Bilirubin: 0.8 mg/dL (ref 0.3–1.2)
Total Protein: 6.3 g/dL — ABNORMAL LOW (ref 6.5–8.1)

## 2022-02-27 MED ORDER — HEPARIN SOD (PORK) LOCK FLUSH 100 UNIT/ML IV SOLN
500.0000 [IU] | Freq: Once | INTRAVENOUS | Status: AC
  Administered 2022-02-27: 500 [IU] via INTRAVENOUS

## 2022-02-27 MED ORDER — SODIUM CHLORIDE 0.9% FLUSH
10.0000 mL | Freq: Once | INTRAVENOUS | Status: AC
  Administered 2022-02-27: 10 mL

## 2022-02-27 MED ORDER — HEPARIN SOD (PORK) LOCK FLUSH 100 UNIT/ML IV SOLN: 500.0000 [IU] | Freq: Once | INTRAVENOUS | Status: DC

## 2022-03-02 DIAGNOSIS — R600 Localized edema: Secondary | ICD-10-CM | POA: Diagnosis not present

## 2022-03-02 DIAGNOSIS — R0602 Shortness of breath: Secondary | ICD-10-CM | POA: Diagnosis not present

## 2022-03-03 MED FILL — Dexamethasone Sodium Phosphate Inj 100 MG/10ML: INTRAMUSCULAR | Qty: 1 | Status: AC

## 2022-03-06 ENCOUNTER — Inpatient Hospital Stay: Payer: Medicare Other

## 2022-03-06 ENCOUNTER — Other Ambulatory Visit: Payer: Self-pay

## 2022-03-06 ENCOUNTER — Encounter: Payer: Self-pay | Admitting: Hematology

## 2022-03-06 ENCOUNTER — Inpatient Hospital Stay (HOSPITAL_BASED_OUTPATIENT_CLINIC_OR_DEPARTMENT_OTHER): Payer: Medicare Other | Admitting: Hematology

## 2022-03-06 VITALS — BP 128/95 | HR 66 | Temp 98.5°F | Resp 16 | Wt 127.7 lb

## 2022-03-06 DIAGNOSIS — C931 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: Secondary | ICD-10-CM | POA: Diagnosis not present

## 2022-03-06 DIAGNOSIS — Z95828 Presence of other vascular implants and grafts: Secondary | ICD-10-CM

## 2022-03-06 DIAGNOSIS — Z5111 Encounter for antineoplastic chemotherapy: Secondary | ICD-10-CM | POA: Diagnosis not present

## 2022-03-06 LAB — CBC WITH DIFFERENTIAL/PLATELET
Abs Immature Granulocytes: 0.02 10*3/uL (ref 0.00–0.07)
Basophils Absolute: 0 10*3/uL (ref 0.0–0.1)
Basophils Relative: 1 %
Eosinophils Absolute: 0 10*3/uL (ref 0.0–0.5)
Eosinophils Relative: 0 %
HCT: 27.9 % — ABNORMAL LOW (ref 36.0–46.0)
Hemoglobin: 8.9 g/dL — ABNORMAL LOW (ref 12.0–15.0)
Immature Granulocytes: 1 %
Lymphocytes Relative: 24 %
Lymphs Abs: 0.9 10*3/uL (ref 0.7–4.0)
MCH: 34.8 pg — ABNORMAL HIGH (ref 26.0–34.0)
MCHC: 31.9 g/dL (ref 30.0–36.0)
MCV: 109 fL — ABNORMAL HIGH (ref 80.0–100.0)
Monocytes Absolute: 0.3 10*3/uL (ref 0.1–1.0)
Monocytes Relative: 8 %
Neutro Abs: 2.4 10*3/uL (ref 1.7–7.7)
Neutrophils Relative %: 66 %
Platelets: 46 10*3/uL — ABNORMAL LOW (ref 150–400)
RBC: 2.56 MIL/uL — ABNORMAL LOW (ref 3.87–5.11)
RDW: 16 % — ABNORMAL HIGH (ref 11.5–15.5)
WBC: 3.7 10*3/uL — ABNORMAL LOW (ref 4.0–10.5)
nRBC: 0 % (ref 0.0–0.2)

## 2022-03-06 LAB — COMPREHENSIVE METABOLIC PANEL
ALT: 13 U/L (ref 0–44)
AST: 17 U/L (ref 15–41)
Albumin: 4.1 g/dL (ref 3.5–5.0)
Alkaline Phosphatase: 38 U/L (ref 38–126)
Anion gap: 8 (ref 5–15)
BUN: 19 mg/dL (ref 8–23)
CO2: 25 mmol/L (ref 22–32)
Calcium: 9.7 mg/dL (ref 8.9–10.3)
Chloride: 103 mmol/L (ref 98–111)
Creatinine, Ser: 0.87 mg/dL (ref 0.44–1.00)
GFR, Estimated: >60 mL/min (ref >60)
Glucose, Bld: 115 mg/dL — ABNORMAL HIGH (ref 70–99)
Potassium: 4.3 mmol/L (ref 3.5–5.1)
Sodium: 136 mmol/L (ref 135–145)
Total Bilirubin: 0.9 mg/dL (ref 0.3–1.2)
Total Protein: 6 g/dL — ABNORMAL LOW (ref 6.5–8.1)

## 2022-03-06 MED ORDER — SODIUM CHLORIDE 0.9% FLUSH
10.0000 mL | INTRAVENOUS | Status: DC | PRN
  Administered 2022-03-06: 10 mL

## 2022-03-06 MED ORDER — HEPARIN SOD (PORK) LOCK FLUSH 100 UNIT/ML IV SOLN
500.0000 [IU] | Freq: Once | INTRAVENOUS | Status: AC | PRN
  Administered 2022-03-06: 500 [IU]

## 2022-03-06 MED ORDER — SODIUM CHLORIDE 0.9% FLUSH
10.0000 mL | Freq: Once | INTRAVENOUS | Status: AC
  Administered 2022-03-06: 10 mL

## 2022-03-06 MED ORDER — PALONOSETRON HCL INJECTION 0.25 MG/5ML
0.2500 mg | Freq: Once | INTRAVENOUS | Status: AC
  Administered 2022-03-06: 0.25 mg via INTRAVENOUS
  Filled 2022-03-06: qty 5

## 2022-03-06 MED ORDER — SODIUM CHLORIDE 0.9 % IV SOLN
10.0000 mg | Freq: Once | INTRAVENOUS | Status: AC
  Administered 2022-03-06: 10 mg via INTRAVENOUS
  Filled 2022-03-06: qty 10

## 2022-03-06 MED ORDER — SODIUM CHLORIDE 0.9 % IV SOLN
75.0000 mg/m2 | Freq: Once | INTRAVENOUS | Status: AC
  Administered 2022-03-06: 120 mg via INTRAVENOUS
  Filled 2022-03-06: qty 12

## 2022-03-06 MED ORDER — SODIUM CHLORIDE 0.9 % IV SOLN: Freq: Once | INTRAVENOUS | Status: AC

## 2022-03-06 NOTE — Progress Notes (Signed)
Pt requested to stay accessed overnight due to having infusion tomorrow (03/07/2022) as well. Home dressing and biopatch applied.  ?

## 2022-03-06 NOTE — Patient Instructions (Signed)
[facility]   ?Discharge Instructions: ?Thank you for choosing [facility] to provide your oncology and hematology care.  ? ?If you have a lab appointment with the [facility], please go directly to the [facility] and check in at the registration area. ?  ?Wear comfortable clothing and clothing appropriate for easy access to any Portacath or PICC line.  ? ?We strive to give you quality time with your provider. You may need to reschedule your appointment if you arrive late (15 or more minutes).  Arriving late affects you and other patients whose appointments are after yours.  Also, if you miss three or more appointments without notifying the office, you may be dismissed from the clinic at the provider?s discretion.    ?  ?For prescription refill requests, have your pharmacy contact our office and allow 72 hours for refills to be completed.   ? ?Today you received the following chemotherapy and/or immunotherapy agents: Azacitidine (Vidaza)    ?  ?To help prevent nausea and vomiting after your treatment, we encourage you to take your nausea medication as directed. ? ?BELOW ARE SYMPTOMS THAT SHOULD BE REPORTED IMMEDIATELY: ?*FEVER GREATER THAN 100.4 F (38 ?C) OR HIGHER ?*CHILLS OR SWEATING ?*NAUSEA AND VOMITING THAT IS NOT CONTROLLED WITH YOUR NAUSEA MEDICATION ?*UNUSUAL SHORTNESS OF BREATH ?*UNUSUAL BRUISING OR BLEEDING ?*URINARY PROBLEMS (pain or burning when urinating, or frequent urination) ?*BOWEL PROBLEMS (unusual diarrhea, constipation, pain near the anus) ?TENDERNESS IN MOUTH AND THROAT WITH OR WITHOUT PRESENCE OF ULCERS (sore throat, sores in mouth, or a toothache) ?UNUSUAL RASH, SWELLING OR PAIN  ?UNUSUAL VAGINAL DISCHARGE OR ITCHING  ? ?Items with * indicate a potential emergency and should be followed up as soon as possible or go to the Emergency Department if any problems should occur. ? ?Please show the CHEMOTHERAPY ALERT CARD or IMMUNOTHERAPY ALERT CARD at  check-in to the Emergency Department and triage nurse. ? ?Should you have questions after your visit or need to cancel or reschedule your appointment, please contact [facility]  Dept: [phone]  and follow the prompts.  Office hours are 8:00 a.m. to 4:30 p.m. Monday - Friday. Please note that voicemails left after 4:00 p.m. may not be returned until the following business day.  We are closed weekends and major holidays. You have access to a nurse at all times for urgent questions. Please call the main number to the clinic Dept: [phone] and follow the prompts. ? ? ?For any non-urgent questions, you may also contact your provider using MyChart. We now offer e-Visits for anyone 16 and older to request care online for non-urgent symptoms. For details visit [email or web address]. ?  ?Also download the MyChart app! Go to the app store, search "MyChart", open the app, select [facility], and log in with your MyChart username and password. ? ?Due to Covid, a mask is required upon entering the hospital/clinic. If you do not have a mask, one will be given to you upon arrival. For doctor visits, patients may have 1 support Sebastien Jackson aged 76 or older with them. For treatment visits, patients cannot have anyone with them due to current Covid guidelines and our immunocompromised population.  ? ?

## 2022-03-06 NOTE — Progress Notes (Signed)
[facility]   Telephone:(336) [phone] Fax:(336) [phone]   Clinic Follow up Note   Patient Care Team: [name], MD as PCP - General (Family Medicine) [name], Wonda Cheng, MD as PCP - Cardiology (Cardiology) Gavin Pound, MD as Consulting Physician (Rheumatology) Truitt Merle, MD as Consulting Physician (Hematology) Juanita Craver, MD as Consulting Physician (Gastroenterology) Garner Nash, DO as Consulting Physician (Pulmonary Disease)  Date of Service:  03/06/2022  CHIEF COMPLAINT: f/u of CMML  CURRENT THERAPY:  Azacitadine inj daily x7 days q28 days, starting 02/06/22 Irradiated blood products, RBC if Hg <8 and plt transfusion if plt <20  ASSESSMENT & PLAN:  [name] is a 76 y.o. female with   1. 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 SPEP and UPEP with immunofixation were negative. No lab evidence of hemolysis, erythropoietin level is normal.  -Her prior bone marrow biopsy in 2016 was negative except several small lymphoid aggregation, suspicious for low-grade B cell lymphoproliferative process, especially SLL. Her peripheral white count has been normal, no elevated lymphocytes -CT scan from 03/2015 was negative for adenopathy or splenomegaly. no B symptoms  -she has been under observation -She developed worsening back pain in Summer 2022 and underwent work up, pelvic MRI 07/04/21 showed a mass in the presacral space. -bone marrow biopsy on 01/05/22 showed hypercellular marrow with features of myelodysplastic/myeloproliferative neoplasm, increased 12% blasts, most consistent with CMML-2. Her BM biopsy was reviewed at [facility] and was felt to be CMML-1 with 5% blasts. Her previous presacral soft tissue biopsy which showed same CMML as her bone marrow biopsy. -she met Dr. Linus Orn at [facility] on 01/26/22. Vidaza was also recommended  -She began azacitadine daily for 7 days (5+2) every  28 days, starting 02/06/22. -she is scheduled for second cycle of azacitadine today. Labs reviewed, hgb 8.9, plt 46k today. Overall adequate to proceed with treatment, but I discussed she may require blood transfusion this week or next. -we will plan for restaging CT after cycle 3.   2. Symptom management: abdominal cramps, anemia, thrombocytopenia -She developed abdominal cramps during the first week of by Daza, debilitating at first when not having BMs. -She corrected constipation, cramps have improved but not resolved. -Continue symptom management with Tylenol, heat, and start Bentyl as needed.  Avoid NSAIDs due to thrombocytopenia -She has worsening cytopenias after starting treatment, received 1 unit irradiated red blood cells 2/28 -today's Hgb 8.9, platelet 46K.  She is mildly symptomatic of anemia with fatigue   3. Rheumatoid arthritis -on Remicade q8 weeks per rheumatologist Dr. Trudie Reed. We reviewed the risk of lymphoma and immunosupression on Remicade -She takes vitamin supplements and gets prolia injection q81month with her PCP.    4. COPD, AF, HTN -on resp meds and eliquis; continue per PCP   PLAN: -proceed with azacitadine today and daily (M-F) x7days -lab and f/u on 3/27 with NP Lacie -lab and flush weekly x5 starting 4/3 -f/u with me and azacitadine starting 4/10   No problem-specific Assessment & Plan notes found for this encounter.   SUMMARY OF ONCOLOGIC HISTORY: Oncology History  CMML (chronic myelomonocytic leukemia) ([place]  11/09/2021 Initial Biopsy   DIAGNOSIS:   -  Monoclonal B-cell population with co-expression of CD5 comprises 17%  of all lymphocytes  -  See comment   COMMENT:  In addition to the clonal B-cell population, there is a myeloblast  population (CD34, CD38, HLA-DR, CD117,  CD123 and CD33) that comprises 2% of the total cellular events.  Please see concurrent tissue biopsy (below) for additional work-up and final diagnosis.    FINAL MICROSCOPIC  DIAGNOSIS:   A. SOFT TISSUE MASS, PRE SACRAL, NEEDLE CORE BIOPSY:  -  Chronic lymphocytic leukemia/small lymphocytic lymphoma  -  Extra medullary hematopoiesis  -  See comment   COMMENT:  The biopsy consists of multiple soft tissue cores with lymphoid nodules and a dense hematopoietic infiltrate consistent with extra medullary hematopoiesis.  MPO and E-cadherin highlight myeloid and erythroid precursors respectively.  CD34 highlights increased vasculature and is also positive within the cytoplasm of megakaryocytes.  A few small, immature mononuclear cells appear to be positive for CD34 and CD117. TdT shows rare, scattered positive cells.  CD20 highlights aggregates of B cells which are admixed with CD3 positive T cells.  T cells are an admixture of CD4 and CD8.  The B cells are also positive for CD5, CD23 and Bcl-2.  The B cells do not show significant staining for CD10, BCL6 or cyclin D1.  CD138 highlights scattered plasma cells which are polytypic by kappa and lambda in situ hybridization.  Flow cytometry performed on the sample (see WL [id number]) identified a kappa restricted CD5 positive B-cell population comprising 70% of lymphocytes.  In addition, a small myeloblast population comprised 2% of the total cellular events.   Overall, the findings are consistent with soft tissue involvement by  chronic lymphocytic leukemia/small lymphocytic lymphoma and extra medullary hematopoiesis. In reviewing the patient's CBC data (macrocytic anemia and thrombocytopenia), I would recommend a bone marrow biopsy to assess for marrow involvement by CLL/SLL.    12/23/2021 Imaging   EXAM: CT CHEST, ABDOMEN, AND PELVIS WITH CONTRAST  IMPRESSION: 1. Slight interval enlargement of a presacral soft tissue mass measuring 7.2 x 4.7 cm, previously 6.9 x 4.1 cm on prior MR of the pelvis dated 07/04/2021. By report, this represents a biopsy proven lymphoma. 2. Pleural nodule of the dependent right lower lobe overlying  the posterior right tenth rib and pleural or paraspinous soft tissue mass overlying the right aspect of the T10 vertebral body, very slightly enlarged compared to prior examination of the chest dated 06/25/2020, consistent with additional sites of lymphomatous involvement given very indolent growth. These could be better assessed for metabolic activity by FDG PET/CT if desired. 3. There is mild, bibasilar predominant pulmonary fibrosis in a pattern featuring irregular peripheral interstitial opacity, septal thickening, but without clear evidence of subpleural bronchiolectasis or honeycombing, with a somewhat asymmetric distribution most conspicuously involving the right lower lobe and lingula. These findings are significantly worsened when compared to prior examination dated 06/25/2020, particularly in the right lower lobe. Given interval change, this may reflect sequelae of interval infection or aspiration, however appearance is generally suspicious for fibrotic interstitial lung disease, and if characterized by ATS pulmonary fibrosis is in an "indeterminate for UIP" pattern, differential considerations including both UIP and NSIP. 4. Emphysema.   Aortic Atherosclerosis (ICD10-I70.0) and Emphysema (ICD10-J43.9).   01/05/2022 Pathology Results   DIAGNOSIS:   BONE MARROW, ASPIRATE, CLOT, CORE:  -Hypercellular bone marrow for age with features of  myelodysplastic/myeloproliferative neoplasm  -Minor abnormal B-cell population  -See comment   PERIPHERAL BLOOD:  -Macrocytic anemia  -Neutrophilic left shift and monocytosis  -Thrombocytopenia   COMMENT:  The bone marrow is hypercellular for age with dyspoietic changes  involving myeloid cell lines associated with monocytosis and increased number of blastic cells (12%) as primarily seen by morphology, many of  which display monocytic features.  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  In this background, there are several predominantly small lymphoid aggregates mostly composed of small lymphoid cells.  By flow cytometry, a minor abnormal B-cell population expressing CD5 is seen and representing 2% of all cells.  This correlate with previously known B-cell lymphoproliferative process.  Correlation with cytogenetic and FISH studies is strongly recommended.    DIAGNOSIS:   -Increased number of monocytic cells present (25%)  -Minor abnormal B-cell population identified.  -See comment   COMMENT:  Flow cytometric analysis shows increased number of monocytic cells representing 25% of all cells but without aberrant phenotype or CD34 expression.  A significant CD34-positive blastic population is not identified.  The lymphoid population shows a minor B-cell population representing 2% of all cells and expressing B-cell antigens including CD20 associated with CD5, CD200 and possibly dim kappa expression.  The latter findings are abnormal and correlate with previously known B-cell lymphoproliferative process.  No significant T-cell phenotypic abnormalities identified.    01/12/2022 Initial Diagnosis   CMML (chronic myelomonocytic leukemia) ([facility])   01/12/2022 Cancer Staging   Staging form: Chronic Myeloid Leukemia, AJCC 8th Edition - Clinical stage from 01/12/2022: Bone marrow blast count (%): 12, Additional clonal changes: Unknown - Signed by Truitt Merle, MD on 01/12/2022 Stage prefix: Initial diagnosis    02/06/2022 -  Chemotherapy   Patient is on Treatment Plan : MYELODYSPLASIA  Azacitidine IV D1-7 q28d        INTERVAL HISTORY:  [name] is here for a follow up of CMML. She was last seen by NP Lacie on 02/20/22. She presents to the clinic accompanied by her husband. She reports new shortness of breath with minimal exertion for the last week.  She notes she is  able to do everything on her own and does not require a walker or cane. She does report persistent LE edema, but she notes it does go away after she sits down and puts her feet up. She endorses wearing compression socks.   All other systems were reviewed with the patient and are negative.  MEDICAL HISTORY:  Past Medical History:  Diagnosis Date   Arthritis    Rheumatoid arthritis   Celiac disease    Chronic kidney disease    stage 3 from MD notes   COPD (chronic obstructive pulmonary disease) ([facility])    Dyspnea    with going up stairs   Family history of adverse reaction to anesthesia    father had hard time waking up   Headache    sinus headaches   Hot flashes    Hypertension    Iron deficiency anemia    Pneumonia    per patient "I have walking pneumonia"    SURGICAL HISTORY: Past Surgical History:  Procedure Laterality Date   COLONOSCOPY     ECTOPIC PREGNANCY SURGERY      x 2   IR IMAGING GUIDED PORT INSERTION  01/31/2022   REVERSE SHOULDER ARTHROPLASTY Right 02/02/2017   Procedure: RIGHT REVERSE SHOULDER ARTHROPLASTY;  Surgeon: Netta Cedars, MD;  Location: [facility];  Service: Orthopedics;  Laterality: Right;    I have reviewed the social history and family history with the patient and they are unchanged from previous note.  ALLERGIES:  is allergic to gluten meal, penicillins, fosamax [alendronate], and hydroxychloroquine.  MEDICATIONS:  Current Outpatient Medications  Medication Sig Dispense  Refill   acetaminophen (TYLENOL) 500 MG tablet Take 1,000 mg by mouth every 6 (six) hours as needed for mild pain.     acetaminophen-codeine (TYLENOL #3) 300-30 MG tablet Take 1 tablet by mouth every 6 (six) hours as needed for moderate pain. 15 tablet 0   apixaban (ELIQUIS) 5 MG TABS tablet Take 1 tablet (5 mg total) by mouth 2 (two) times daily. 60 tablet 11   Calcium-Magnesium-Vitamin D (CALCIUM 1200+D3 PO) Take 1 tablet by mouth daily.     denosumab (PROLIA) 60 MG/ML SOSY injection  Inject 60 mg into the skin every 6 (six) months.     dicyclomine (BENTYL) 10 MG capsule Take 1 capsule (10 mg total) by mouth 3 times/day as needed-between meals & bedtime for spasms. 30 capsule 0   gabapentin (NEURONTIN) 300 MG capsule Take 300 mg by mouth 3 (three) times daily.      InFLIXimab (REMICADE IV) Inject 10 mg into the vein every 8 (eight) weeks.     lidocaine-prilocaine (EMLA) cream Apply to affected area once 30 g 3   losartan (COZAAR) 50 MG tablet Take 50 mg by mouth daily.      metoprolol succinate (TOPROL-XL) 50 MG 24 hr tablet Take 50 mg by mouth every morning.      ondansetron (ZOFRAN) 8 MG tablet Take 1 tablet (8 mg total) by mouth 2 (two) times daily as needed for refractory nausea / vomiting. Start on the third day after last day of chemotherapy. 30 tablet 1   predniSONE (DELTASONE) 5 MG tablet Take 5 mg by mouth daily. TAKE 1 TABLET ORALLY ONCE A DAY FOR 30 DAYS     prochlorperazine (COMPAZINE) 10 MG tablet Take 1 tablet (10 mg total) by mouth every 6 (six) hours as needed (Nausea or vomiting). 30 tablet 1   Sod Picosulfate-Mag Ox-Cit Acd (CLENPIQ) 10-3.5-12 MG-GM -GM/160ML SOLN 160 mL orally twice for 2     Tiotropium Bromide-Olodaterol (STIOLTO RESPIMAT) 2.5-2.5 MCG/ACT AERS Inhale 2 puffs into the lungs daily. 4 g 11   vitamin C (ASCORBIC ACID) 500 MG tablet Take 500 mg by mouth daily.     No current facility-administered medications for this visit.    PHYSICAL EXAMINATION: ECOG PERFORMANCE STATUS: 3 - Symptomatic, >50% confined to bed  Vitals:   03/06/22 1350  BP: (!) 128/95  Pulse: 66  Resp: 16  Temp: 98.5 F (36.9 C)  SpO2: 94%   Wt Readings from Last 3 Encounters:  03/06/22 127 lb 11.2 oz (57.9 kg)  02/20/22 124 lb 7 oz (56.4 kg)  02/13/22 122 lb 8 oz (55.6 kg)     GENERAL:alert, no distress and comfortable SKIN: skin color normal, no rashes or significant lesions EYES: normal, Conjunctiva are pink and non-injected, sclera clear  NEURO: alert &  oriented x 3 with fluent speech  LABORATORY DATA:  I have reviewed the data as listed CBC Latest Ref Rng & Units 03/06/2022 02/27/2022 02/23/2022  WBC 4.0 - 10.5 K/uL 3.7(L) 3.9(L) 4.6  Hemoglobin 12.0 - 15.0 g/dL 8.9(L) 9.2(L) 9.3(L)  Hematocrit 36.0 - 46.0 % 27.9(L) 28.7(L) 28.7(L)  Platelets 150 - 400 K/uL 46(L) 27(L) 43(L)     CMP Latest Ref Rng & Units 03/06/2022 02/27/2022 02/20/2022  Glucose 70 - 99 mg/dL 115(H) 173(H) 121(H)  BUN 8 - 23 mg/dL 19 16 19   Creatinine 0.44 - 1.00 mg/dL 0.87 1.06(H) 0.94  Sodium 135 - 145 mmol/L 136 134(L) 133(L)  Potassium 3.5 - 5.1 mmol/L 4.3 4.3 4.7  Chloride 98 - 111 mmol/L 103 100 99  CO2 22 - 32 mmol/L 25 24 26   Calcium 8.9 - 10.3 mg/dL 9.7 10.0 9.6  Total Protein 6.5 - 8.1 g/dL 6.0(L) 6.3(L) 6.1(L)  Total Bilirubin 0.3 - 1.2 mg/dL 0.9 0.8 0.7  Alkaline Phos 38 - 126 U/L 38 39 36(L)  AST 15 - 41 U/L 17 16 15   ALT 0 - 44 U/L 13 15 12       RADIOGRAPHIC STUDIES: I have personally reviewed the radiological images as listed and agreed with the findings in the report. No results found.    No orders of the defined types were placed in this encounter.  All questions were answered. The patient knows to call the clinic with any problems, questions or concerns. No barriers to learning was detected. The total time spent in the appointment was 30 minutes.     Truitt Merle, MD 03/06/2022   I, Wilburn Mylar, am acting as scribe for Truitt Merle, MD.   I have reviewed the above documentation for accuracy and completeness, and I agree with the above.

## 2022-03-06 NOTE — Progress Notes (Signed)
Nutrition Follow-up: ? ?Patient with CMML followed by Dr Burr Medico. ?Patient receiving vidaza.   ? ?Met with patient during treatment.  Reports that appetite is good. Denies any problems with nutrition impact symptoms.  Reports that she usually eats at least 2 meals per day.  Drinks oral nutrition supplements.  Uses gluten free products due to celiac disease.   ? ? ? ?Medications: reviewed ? ?Labs: reviewed ? ?Anthropometrics:  ? ?Weight 127 lb today ? ?126 lb 2/13 ?UBW of 130 lb ? ? ?NUTRITION DIAGNOSIS: Inadequate oral intake improving ? ? ?INTERVENTION:  ?Continue to encourage well balanced diet including good sources of protein.  ?Continue oral nutrition supplements as able to prevent weight loss ?Patient with question regarding carbohydrate rich foods. ?  ? ?MONITORING, EVALUATION, GOAL: weight trends, intake ? ? ?NEXT VISIT: to be determined with treatment ? ?Zackaria Burkey B. Zenia Resides, RD, LDN ?Registered Dietitian ?[phone] (mobile) ? ? ?

## 2022-03-07 ENCOUNTER — Inpatient Hospital Stay: Payer: Medicare Other

## 2022-03-07 ENCOUNTER — Telehealth: Payer: Self-pay | Admitting: Hematology

## 2022-03-07 VITALS — BP 111/69 | HR 82 | Temp 98.4°F | Resp 22

## 2022-03-07 DIAGNOSIS — C931 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: Secondary | ICD-10-CM | POA: Diagnosis not present

## 2022-03-07 DIAGNOSIS — Z5111 Encounter for antineoplastic chemotherapy: Secondary | ICD-10-CM | POA: Diagnosis not present

## 2022-03-07 MED ORDER — SODIUM CHLORIDE 0.9% FLUSH
10.0000 mL | INTRAVENOUS | Status: DC | PRN
  Administered 2022-03-07: 10 mL

## 2022-03-07 MED ORDER — HEPARIN SOD (PORK) LOCK FLUSH 100 UNIT/ML IV SOLN
500.0000 [IU] | Freq: Once | INTRAVENOUS | Status: AC | PRN
  Administered 2022-03-07: 500 [IU]

## 2022-03-07 MED ORDER — SODIUM CHLORIDE 0.9 % IV SOLN
10.0000 mg | Freq: Once | INTRAVENOUS | Status: AC
  Administered 2022-03-07: 10 mg via INTRAVENOUS
  Filled 2022-03-07: qty 10

## 2022-03-07 MED ORDER — SODIUM CHLORIDE 0.9 % IV SOLN: Freq: Once | INTRAVENOUS | Status: AC

## 2022-03-07 MED ORDER — SODIUM CHLORIDE 0.9 % IV SOLN
75.0000 mg/m2 | Freq: Once | INTRAVENOUS | Status: AC
  Administered 2022-03-07: 120 mg via INTRAVENOUS
  Filled 2022-03-07: qty 12

## 2022-03-07 NOTE — Patient Instructions (Signed)
[facility]   ?Discharge Instructions: ?Thank you for choosing [facility] to provide your oncology and hematology care.  ? ?If you have a lab appointment with the [facility], please go directly to the [facility] and check in at the registration area. ?  ?Wear comfortable clothing and clothing appropriate for easy access to any Portacath or PICC line.  ? ?We strive to give you quality time with your provider. You may need to reschedule your appointment if you arrive late (15 or more minutes).  Arriving late affects you and other patients whose appointments are after yours.  Also, if you miss three or more appointments without notifying the office, you may be dismissed from the clinic at the provider?s discretion.    ?  ?For prescription refill requests, have your pharmacy contact our office and allow 72 hours for refills to be completed.   ? ?Today you received the following chemotherapy and/or immunotherapy agents: Azacitidine (Vidaza)    ?  ?To help prevent nausea and vomiting after your treatment, we encourage you to take your nausea medication as directed. ? ?BELOW ARE SYMPTOMS THAT SHOULD BE REPORTED IMMEDIATELY: ?*FEVER GREATER THAN 100.4 F (38 ?C) OR HIGHER ?*CHILLS OR SWEATING ?*NAUSEA AND VOMITING THAT IS NOT CONTROLLED WITH YOUR NAUSEA MEDICATION ?*UNUSUAL SHORTNESS OF BREATH ?*UNUSUAL BRUISING OR BLEEDING ?*URINARY PROBLEMS (pain or burning when urinating, or frequent urination) ?*BOWEL PROBLEMS (unusual diarrhea, constipation, pain near the anus) ?TENDERNESS IN MOUTH AND THROAT WITH OR WITHOUT PRESENCE OF ULCERS (sore throat, sores in mouth, or a toothache) ?UNUSUAL RASH, SWELLING OR PAIN  ?UNUSUAL VAGINAL DISCHARGE OR ITCHING  ? ?Items with * indicate a potential emergency and should be followed up as soon as possible or go to the Emergency Department if any problems should occur. ? ?Please show the CHEMOTHERAPY ALERT CARD or IMMUNOTHERAPY ALERT CARD at  check-in to the Emergency Department and triage nurse. ? ?Should you have questions after your visit or need to cancel or reschedule your appointment, please contact [facility]  Dept: [phone]  and follow the prompts.  Office hours are 8:00 a.m. to 4:30 p.m. Monday - Friday. Please note that voicemails left after 4:00 p.m. may not be returned until the following business day.  We are closed weekends and major holidays. You have access to a nurse at all times for urgent questions. Please call the main number to the clinic Dept: [phone] and follow the prompts. ? ? ?For any non-urgent questions, you may also contact your provider using MyChart. We now offer e-Visits for anyone 81 and older to request care online for non-urgent symptoms. For details visit [email or web address]. ?  ?Also download the MyChart app! Go to the app store, search "MyChart", open the app, select [facility], and log in with your MyChart username and password. ? ?Due to Covid, a mask is required upon entering the hospital/clinic. If you do not have a mask, one will be given to you upon arrival. For doctor visits, patients may have 1 support person aged 73 or older with them. For treatment visits, patients cannot have anyone with them due to current Covid guidelines and our immunocompromised population.  ? ?

## 2022-03-07 NOTE — Telephone Encounter (Signed)
Scheduled follow-up appointments per 3/13 los. Patient is aware. ?

## 2022-03-08 ENCOUNTER — Inpatient Hospital Stay: Payer: Medicare Other

## 2022-03-08 ENCOUNTER — Other Ambulatory Visit: Payer: Self-pay

## 2022-03-08 VITALS — BP 105/56 | HR 100 | Temp 98.4°F | Resp 18

## 2022-03-08 DIAGNOSIS — C931 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: Secondary | ICD-10-CM

## 2022-03-08 DIAGNOSIS — Z5111 Encounter for antineoplastic chemotherapy: Secondary | ICD-10-CM | POA: Diagnosis not present

## 2022-03-08 MED ORDER — SODIUM CHLORIDE 0.9% FLUSH
10.0000 mL | INTRAVENOUS | Status: DC | PRN
  Administered 2022-03-08: 10 mL

## 2022-03-08 MED ORDER — HEPARIN SOD (PORK) LOCK FLUSH 100 UNIT/ML IV SOLN
500.0000 [IU] | Freq: Once | INTRAVENOUS | Status: AC | PRN
  Administered 2022-03-08: 500 [IU]

## 2022-03-08 MED ORDER — SODIUM CHLORIDE 0.9 % IV SOLN: Freq: Once | INTRAVENOUS | Status: AC

## 2022-03-08 MED ORDER — SODIUM CHLORIDE 0.9 % IV SOLN
10.0000 mg | Freq: Once | INTRAVENOUS | Status: AC
  Administered 2022-03-08: 10 mg via INTRAVENOUS
  Filled 2022-03-08: qty 10

## 2022-03-08 MED ORDER — SODIUM CHLORIDE 0.9 % IV SOLN
75.0000 mg/m2 | Freq: Once | INTRAVENOUS | Status: AC
  Administered 2022-03-08: 120 mg via INTRAVENOUS
  Filled 2022-03-08: qty 12

## 2022-03-08 MED ORDER — PALONOSETRON HCL INJECTION 0.25 MG/5ML
0.2500 mg | Freq: Once | INTRAVENOUS | Status: AC
  Administered 2022-03-08: 0.25 mg via INTRAVENOUS
  Filled 2022-03-08: qty 5

## 2022-03-08 MED FILL — Dexamethasone Sodium Phosphate Inj 100 MG/10ML: INTRAMUSCULAR | Qty: 1 | Status: AC

## 2022-03-08 NOTE — Patient Instructions (Signed)
[facility]   ?Discharge Instructions: ?Thank you for choosing [facility] to provide your oncology and hematology care.  ? ?If you have a lab appointment with the [facility], please go directly to the [facility] and check in at the registration area. ?  ?Wear comfortable clothing and clothing appropriate for easy access to any Portacath or PICC line.  ? ?We strive to give you quality time with your provider. You may need to reschedule your appointment if you arrive late (15 or more minutes).  Arriving late affects you and other patients whose appointments are after yours.  Also, if you miss three or more appointments without notifying the office, you may be dismissed from the clinic at the provider?s discretion.    ?  ?For prescription refill requests, have your pharmacy contact our office and allow 72 hours for refills to be completed.   ? ?Today you received the following chemotherapy and/or immunotherapy agents: Azacitidine (Vidaza)    ?  ?To help prevent nausea and vomiting after your treatment, we encourage you to take your nausea medication as directed. ? ?BELOW ARE SYMPTOMS THAT SHOULD BE REPORTED IMMEDIATELY: ?*FEVER GREATER THAN 100.4 F (38 ?C) OR HIGHER ?*CHILLS OR SWEATING ?*NAUSEA AND VOMITING THAT IS NOT CONTROLLED WITH YOUR NAUSEA MEDICATION ?*UNUSUAL SHORTNESS OF BREATH ?*UNUSUAL BRUISING OR BLEEDING ?*URINARY PROBLEMS (pain or burning when urinating, or frequent urination) ?*BOWEL PROBLEMS (unusual diarrhea, constipation, pain near the anus) ?TENDERNESS IN MOUTH AND THROAT WITH OR WITHOUT PRESENCE OF ULCERS (sore throat, sores in mouth, or a toothache) ?UNUSUAL RASH, SWELLING OR PAIN  ?UNUSUAL VAGINAL DISCHARGE OR ITCHING  ? ?Items with * indicate a potential emergency and should be followed up as soon as possible or go to the Emergency Department if any problems should occur. ? ?Please show the CHEMOTHERAPY ALERT CARD or IMMUNOTHERAPY ALERT CARD at  check-in to the Emergency Department and triage nurse. ? ?Should you have questions after your visit or need to cancel or reschedule your appointment, please contact [facility]  Dept: [phone]  and follow the prompts.  Office hours are 8:00 a.m. to 4:30 p.m. Monday - Friday. Please note that voicemails left after 4:00 p.m. may not be returned until the following business day.  We are closed weekends and major holidays. You have access to a nurse at all times for urgent questions. Please call the main number to the clinic Dept: [phone] and follow the prompts. ? ? ?For any non-urgent questions, you may also contact your provider using MyChart. We now offer e-Visits for anyone 70 and older to request care online for non-urgent symptoms. For details visit [email or web address]. ?  ?Also download the MyChart app! Go to the app store, search "MyChart", open the app, select [facility], and log in with your MyChart username and password. ? ?Due to Covid, a mask is required upon entering the hospital/clinic. If you do not have a mask, one will be given to you upon arrival. For doctor visits, patients may have 1 support person aged 38 or older with them. For treatment visits, patients cannot have anyone with them due to current Covid guidelines and our immunocompromised population.  ? ?

## 2022-03-09 ENCOUNTER — Inpatient Hospital Stay: Payer: Medicare Other

## 2022-03-09 VITALS — BP 114/67 | HR 100 | Temp 98.4°F | Resp 17

## 2022-03-09 DIAGNOSIS — C931 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: Secondary | ICD-10-CM | POA: Diagnosis not present

## 2022-03-09 DIAGNOSIS — Z5111 Encounter for antineoplastic chemotherapy: Secondary | ICD-10-CM | POA: Diagnosis not present

## 2022-03-09 MED ORDER — SODIUM CHLORIDE 0.9 % IV SOLN: Freq: Once | INTRAVENOUS | Status: AC

## 2022-03-09 MED ORDER — HEPARIN SOD (PORK) LOCK FLUSH 100 UNIT/ML IV SOLN
500.0000 [IU] | Freq: Once | INTRAVENOUS | Status: AC | PRN
  Administered 2022-03-09: 500 [IU]

## 2022-03-09 MED ORDER — SODIUM CHLORIDE 0.9 % IV SOLN
10.0000 mg | Freq: Once | INTRAVENOUS | Status: AC
  Administered 2022-03-09: 10 mg via INTRAVENOUS
  Filled 2022-03-09: qty 10

## 2022-03-09 MED ORDER — SODIUM CHLORIDE 0.9% FLUSH
10.0000 mL | INTRAVENOUS | Status: DC | PRN
  Administered 2022-03-09: 10 mL

## 2022-03-09 MED ORDER — SODIUM CHLORIDE 0.9 % IV SOLN
75.0000 mg/m2 | Freq: Once | INTRAVENOUS | Status: AC
  Administered 2022-03-09: 120 mg via INTRAVENOUS
  Filled 2022-03-09: qty 12

## 2022-03-09 NOTE — Patient Instructions (Signed)
[facility]  Discharge Instructions: ?Thank you for choosing [facility] to provide your oncology and hematology care.  ? ?If you have a lab appointment with the [facility], please go directly to the [facility] and check in at the registration area. ?  ?Wear comfortable clothing and clothing appropriate for easy access to any Portacath or PICC line.  ? ?We strive to give you quality time with your provider. You may need to reschedule your appointment if you arrive late (15 or more minutes).  Arriving late affects you and other patients whose appointments are after yours.  Also, if you miss three or more appointments without notifying the office, you may be dismissed from the clinic at the provider?s discretion.    ?  ?For prescription refill requests, have your pharmacy contact our office and allow 72 hours for refills to be completed.   ? ?Today you received the following chemotherapy and/or immunotherapy agent: Vidaza    ?  ?To help prevent nausea and vomiting after your treatment, we encourage you to take your nausea medication as directed. ? ?BELOW ARE SYMPTOMS THAT SHOULD BE REPORTED IMMEDIATELY: ?*FEVER GREATER THAN 100.4 F (38 ?C) OR HIGHER ?*CHILLS OR SWEATING ?*NAUSEA AND VOMITING THAT IS NOT CONTROLLED WITH YOUR NAUSEA MEDICATION ?*UNUSUAL SHORTNESS OF BREATH ?*UNUSUAL BRUISING OR BLEEDING ?*URINARY PROBLEMS (pain or burning when urinating, or frequent urination) ?*BOWEL PROBLEMS (unusual diarrhea, constipation, pain near the anus) ?TENDERNESS IN MOUTH AND THROAT WITH OR WITHOUT PRESENCE OF ULCERS (sore throat, sores in mouth, or a toothache) ?UNUSUAL RASH, SWELLING OR PAIN  ?UNUSUAL VAGINAL DISCHARGE OR ITCHING  ? ?Items with * indicate a potential emergency and should be followed up as soon as possible or go to the Emergency Department if any problems should occur. ? ?Please show the CHEMOTHERAPY ALERT CARD or IMMUNOTHERAPY ALERT CARD at check-in to the  Emergency Department and triage nurse. ? ?Should you have questions after your visit or need to cancel or reschedule your appointment, please contact [facility]  Dept: [phone]  and follow the prompts.  Office hours are 8:00 a.m. to 4:30 p.m. Monday - Friday. Please note that voicemails left after 4:00 p.m. may not be returned until the following business day.  We are closed weekends and major holidays. You have access to a nurse at all times for urgent questions. Please call the main number to the clinic Dept: [phone] and follow the prompts. ? ? ?For any non-urgent questions, you may also contact your provider using MyChart. We now offer e-Visits for anyone 39 and older to request care online for non-urgent symptoms. For details visit [email or web address]. ?  ?Also download the MyChart app! Go to the app store, search "MyChart", open the app, select [facility], and log in with your MyChart username and password. ? ?Due to Covid, a mask is required upon entering the hospital/clinic. If you do not have a mask, one will be given to you upon arrival. For doctor visits, patients may have 1 support person aged 51 or older with them. For treatment visits, patients cannot have anyone with them due to current Covid guidelines and our immunocompromised population.  ? ?

## 2022-03-10 ENCOUNTER — Ambulatory Visit (INDEPENDENT_AMBULATORY_CARE_PROVIDER_SITE_OTHER): Payer: Medicare Other | Admitting: Pulmonary Disease

## 2022-03-10 ENCOUNTER — Encounter: Payer: Self-pay | Admitting: Pulmonary Disease

## 2022-03-10 ENCOUNTER — Inpatient Hospital Stay: Payer: Medicare Other

## 2022-03-10 ENCOUNTER — Other Ambulatory Visit: Payer: Self-pay

## 2022-03-10 VITALS — BP 118/72 | HR 101 | Temp 97.7°F | Ht 63.0 in | Wt 127.0 lb

## 2022-03-10 VITALS — BP 134/97 | HR 76 | Temp 97.8°F | Resp 18

## 2022-03-10 DIAGNOSIS — I480 Paroxysmal atrial fibrillation: Secondary | ICD-10-CM | POA: Diagnosis not present

## 2022-03-10 DIAGNOSIS — C931 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: Secondary | ICD-10-CM | POA: Diagnosis not present

## 2022-03-10 DIAGNOSIS — R918 Other nonspecific abnormal finding of lung field: Secondary | ICD-10-CM | POA: Diagnosis not present

## 2022-03-10 DIAGNOSIS — J449 Chronic obstructive pulmonary disease, unspecified: Secondary | ICD-10-CM | POA: Diagnosis not present

## 2022-03-10 DIAGNOSIS — Z5111 Encounter for antineoplastic chemotherapy: Secondary | ICD-10-CM | POA: Diagnosis not present

## 2022-03-10 MED ORDER — SODIUM CHLORIDE 0.9% FLUSH: 10.0000 mL | INTRAVENOUS | Status: DC | PRN

## 2022-03-10 MED ORDER — BREZTRI AEROSPHERE 160-9-4.8 MCG/ACT IN AERO: 2.0000 | INHALATION_SPRAY | Freq: Two times a day (BID) | RESPIRATORY_TRACT | 0 refills | Status: DC

## 2022-03-10 MED ORDER — SODIUM CHLORIDE 0.9 % IV SOLN: Freq: Once | INTRAVENOUS | Status: AC

## 2022-03-10 MED ORDER — SODIUM CHLORIDE 0.9 % IV SOLN
10.0000 mg | Freq: Once | INTRAVENOUS | Status: AC
  Administered 2022-03-10: 10 mg via INTRAVENOUS
  Filled 2022-03-10: qty 10

## 2022-03-10 MED ORDER — SODIUM CHLORIDE 0.9 % IV SOLN
75.0000 mg/m2 | Freq: Once | INTRAVENOUS | Status: AC
  Administered 2022-03-10: 120 mg via INTRAVENOUS
  Filled 2022-03-10: qty 12

## 2022-03-10 MED ORDER — HEPARIN SOD (PORK) LOCK FLUSH 100 UNIT/ML IV SOLN: 500.0000 [IU] | Freq: Once | INTRAVENOUS | Status: DC | PRN

## 2022-03-10 MED ORDER — PALONOSETRON HCL INJECTION 0.25 MG/5ML
0.2500 mg | Freq: Once | INTRAVENOUS | Status: AC
  Administered 2022-03-10: 0.25 mg via INTRAVENOUS
  Filled 2022-03-10: qty 5

## 2022-03-10 NOTE — Patient Instructions (Signed)
Thank you for visiting Dr. Valeta Harms at [facility]. ?Today we recommend the following: ? ?Breztri samples today  ?Let us know if they help and you want a new prescription  ? ?Return in about 6 months (around 09/10/2022) for with APP or Dr. Valeta Harms. ? ? ? ?Please do your part to reduce the spread of COVID-19.  ? ?

## 2022-03-10 NOTE — Progress Notes (Signed)
? ?Synopsis: Referred in August 2020 for shortness of breath by [name], MD ? ?Subjective:  ? ?PATIENT ID: [name] GENDER: female DOB: 05-23-1946, MRN: [id number] ? ?Chief Complaint  ?Patient presents with  ? Follow-up  ?  Patient is here to talk about shortness of breath.   ? ? 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  Lung RADS 4 a on most recent low-dose lung cancer screening.  Patient was scheduled to follow-up with me regarding abnormal lung nodule.  Follows with [facility]. ? ?OV 08/06/2019: she has been smoking for many years, quit 16 years ago. She stopped 2 times in her life. She quit the first time in her 89s. She was enrolled LDCT and doing well.  She is concerned because she watched her sister have continued decline from COPD.  She also saw her mother died from COPD.  She is not been on any maintenance medications before.  She is only been using as needed short acting Combivent.  She does have exertional dyspnea.  She does try to exercise regularly.  She feels some shortness of breath with exercise but otherwise doing well. ? ?OV 11/12/2019: Low-dose lung cancer screening CT completed in October.  Lung nodule followed in the lingula has resolved. Overall, she is doing well today. Breathlessness is stable.  Overall doing well today.  She does feels her breathing is stable.  Today in the office we reviewed her CT imaging.  Her joints in her hands tend to bother her towards the last few weeks before its time to get her next Remicade injection.  She is currently taking prednisone 5 mg every other day.  She has been working with her rheumatologist to try to come off of this if at all possible.  Currently doing well with her inhaler regimen.  And glad that she was able to switch.  Husband  is currently out of town on a fishing/hunting trip in the [place]. ? ?OV 02/25/2020: Patient here today for follow-up after recent HRCT imaging.  Multiple small pulmonary nodules stable evidence of centrilobular and paraseptal emphysema.  No evidence of ILD.  Did review images today in the office.  Patient currently managed with Stiolto.  Breathing well at this time.  She does complain of thoracic back pain shoulder pain arm pain hand pain all the which is chronic for her and related to her prior rheumatoid arthritis history.  She still is receiving her Remicade infusions.  She did receive both of her COVID-19 vaccinations. ? ?OV 03/10/2022: Here today for follow-up.  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  From a respiratory standpoint she is doing okay.  But she does feel short of breath with exertion. ? ? ? ?Past Medical History:  ?Diagnosis Date  ? Arthritis   ? Rheumatoid arthritis  ? Celiac disease   ? Chronic kidney disease   ? stage 3 from MD notes  ? COPD (chronic obstructive pulmonary disease) ([place])   ? Dyspnea   ? with going up stairs  ? Family history of adverse reaction to anesthesia   ? father had hard time waking up  ? Headache   ? sinus headaches  ? Hot flashes   ? Hypertension   ? Iron deficiency anemia   ?  Pneumonia   ? per patient "I have walking pneumonia"  ?  ? ?Family History  ?Problem Relation Age of Onset  ? Peripheral Artery Disease Father   ? Diabetes Father   ?  ? ?Past Surgical History:  ?Procedure Laterality Date  ? COLONOSCOPY    ? ECTOPIC PREGNANCY SURGERY    ?  x 2  ? IR IMAGING GUIDED PORT INSERTION  01/31/2022  ? REVERSE SHOULDER ARTHROPLASTY Right 02/02/2017  ? Procedure: RIGHT REVERSE SHOULDER ARTHROPLASTY;  Surgeon: Netta Cedars, MD;  Location: [facility];  Service: Orthopedics;  Laterality: Right;  ? ? ?Social History  ? ?Socioeconomic History  ? Marital status: Married  ?  Spouse name: Not on file  ? Number of  children: Not on file  ? Years of education: Not on file  ? Highest education level: Not on file  ?Occupational History  ? Not on file  ?Tobacco Use  ? Smoking status: Former  ?  Packs/day: 1.00  ?  Years: 30.00  ?  Pack years: 30.00  ?  Types: Cigarettes  ?  Quit date: 2005  ?  Years since quitting: 18.2  ? Smokeless tobacco: Never  ?Vaping Use  ? Vaping Use: Never used  ?Substance and Sexual Activity  ? Alcohol use: Yes  ?  Alcohol/week: 12.0 - 14.0 standard drinks  ?  Types: 12 - 14 Glasses of wine per week  ? Drug use: No  ? Sexual activity: Not on file  ?Other Topics Concern  ? Not on file  ?Social History Narrative  ? Not on file  ? ?Social Determinants of Health  ? ?Financial Resource Strain: Not on file  ?Food Insecurity: Not on file  ?Transportation Needs: Not on file  ?Physical Activity: Not on file  ?Stress: Not on file  ?Social Connections: Not on file  ?Intimate Partner Violence: Not on file  ?  ? ?Allergies  ?Allergen Reactions  ? Gluten Meal Diarrhea  ? Penicillins Rash  ?  Many years ago ?  ? Fosamax [Alendronate] Rash  ? Hydroxychloroquine Rash  ?  ? ?Outpatient Medications Prior to Visit  ?Medication Sig Dispense Refill  ? acetaminophen (TYLENOL) 500 MG tablet Take 1,000 mg by mouth every 6 (six) hours as needed for mild pain.    ? apixaban (ELIQUIS) 5 MG TABS tablet Take 1 tablet (5 mg total) by mouth 2 (two) times daily. 60 tablet 11  ? Calcium-Magnesium-Vitamin D (CALCIUM 1200+D3 PO) Take 1 tablet by mouth daily.    ? denosumab (PROLIA) 60 MG/ML SOSY injection Inject 60 mg into the skin every 6 (six) months.    ? gabapentin (NEURONTIN) 300 MG capsule Take 300 mg by mouth 3 (three) times daily.     ? InFLIXimab (REMICADE IV) Inject 10 mg into the vein every 8 (eight) weeks.    ? lidocaine-prilocaine (EMLA) cream Apply to affected area once 30 g 3  ? losartan (COZAAR) 50 MG tablet Take 50 mg by mouth daily.     ? metoprolol succinate (TOPROL-XL) 50 MG 24 hr tablet Take 50 mg by mouth every  morning.     ? predniSONE (DELTASONE) 5 MG tablet Take 5 mg by mouth daily. TAKE 1 TABLET ORALLY ONCE A DAY FOR 30 DAYS    ? Tiotropium Bromide-Olodaterol (STIOLTO RESPIMAT) 2.5-2.5 MCG/ACT AERS Inhale 2 puffs into the lungs daily. 4 g 11  ? vitamin C (ASCORBIC ACID) 500 MG tablet Take 500 mg by mouth daily.    ? acetaminophen-codeine (  TYLENOL #3) 300-30 MG tablet Take 1 tablet by mouth every 6 (six) hours as needed for moderate pain. (Patient not taking: Reported on 03/10/2022) 15 tablet 0  ? dicyclomine (BENTYL) 10 MG capsule Take 1 capsule (10 mg total) by mouth 3 times/day as needed-between meals & bedtime for spasms. (Patient not taking: Reported on 03/10/2022) 30 capsule 0  ? ondansetron (ZOFRAN) 8 MG tablet Take 1 tablet (8 mg total) by mouth 2 (two) times daily as needed for refractory nausea / vomiting. Start on the third day after last day of chemotherapy. (Patient not taking: Reported on 03/10/2022) 30 tablet 1  ? prochlorperazine (COMPAZINE) 10 MG tablet Take 1 tablet (10 mg total) by mouth every 6 (six) hours as needed (Nausea or vomiting). (Patient not taking: Reported on 03/10/2022) 30 tablet 1  ? Sod Picosulfate-Mag Ox-Cit Acd (CLENPIQ) 10-3.5-12 MG-GM -GM/160ML SOLN 160 mL orally twice for 2 (Patient not taking: Reported on 03/10/2022)    ? ?No facility-administered medications prior to visit.  ? ? ?Review of Systems  ?Constitutional:  Negative for chills, fever, malaise/fatigue and weight loss.  ?HENT:  Negative for hearing loss, sore throat and tinnitus.   ?Eyes:  Negative for blurred vision and double vision.  ?Respiratory:  Positive for cough and shortness of breath. Negative for hemoptysis, sputum production, wheezing and stridor.   ?Cardiovascular:  Negative for chest pain, palpitations, orthopnea, leg swelling and PND.  ?Gastrointestinal:  Negative for abdominal pain, constipation, diarrhea, heartburn, nausea and vomiting.  ?Genitourinary:  Negative for dysuria, hematuria and urgency.   ?Musculoskeletal:  Negative for joint pain and myalgias.  ?Skin:  Negative for itching and rash.  ?Neurological:  Negative for dizziness, tingling, weakness and headaches.  ?Endo/Heme/Allergies:  Negative for environmental a

## 2022-03-10 NOTE — Addendum Note (Signed)
Addended by: Fran Lowes on: 03/10/2022 02:14 PM ? ? Modules accepted: Orders ? ?

## 2022-03-13 ENCOUNTER — Inpatient Hospital Stay: Payer: Medicare Other

## 2022-03-13 ENCOUNTER — Other Ambulatory Visit: Payer: Self-pay

## 2022-03-13 VITALS — BP 127/76 | HR 99 | Temp 98.9°F | Resp 17 | Wt 127.5 lb

## 2022-03-13 DIAGNOSIS — C931 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: Secondary | ICD-10-CM

## 2022-03-13 DIAGNOSIS — Z5111 Encounter for antineoplastic chemotherapy: Secondary | ICD-10-CM | POA: Diagnosis not present

## 2022-03-13 DIAGNOSIS — Z95828 Presence of other vascular implants and grafts: Secondary | ICD-10-CM

## 2022-03-13 LAB — CBC WITH DIFFERENTIAL/PLATELET
Abs Immature Granulocytes: 0.03 10*3/uL (ref 0.00–0.07)
Basophils Absolute: 0 10*3/uL (ref 0.0–0.1)
Basophils Relative: 0 %
Eosinophils Absolute: 0 10*3/uL (ref 0.0–0.5)
Eosinophils Relative: 0 %
HCT: 28.5 % — ABNORMAL LOW (ref 36.0–46.0)
Hemoglobin: 9.3 g/dL — ABNORMAL LOW (ref 12.0–15.0)
Immature Granulocytes: 1 %
Lymphocytes Relative: 32 %
Lymphs Abs: 1.1 10*3/uL (ref 0.7–4.0)
MCH: 35.4 pg — ABNORMAL HIGH (ref 26.0–34.0)
MCHC: 32.6 g/dL (ref 30.0–36.0)
MCV: 108.4 fL — ABNORMAL HIGH (ref 80.0–100.0)
Monocytes Absolute: 0.3 10*3/uL (ref 0.1–1.0)
Monocytes Relative: 9 %
Neutro Abs: 2 10*3/uL (ref 1.7–7.7)
Neutrophils Relative %: 58 %
Platelets: 85 10*3/uL — ABNORMAL LOW (ref 150–400)
RBC: 2.63 MIL/uL — ABNORMAL LOW (ref 3.87–5.11)
RDW: 15.6 % — ABNORMAL HIGH (ref 11.5–15.5)
WBC: 3.4 10*3/uL — ABNORMAL LOW (ref 4.0–10.5)
nRBC: 1.2 % — ABNORMAL HIGH (ref 0.0–0.2)

## 2022-03-13 LAB — COMPREHENSIVE METABOLIC PANEL
ALT: 23 U/L (ref 0–44)
AST: 13 U/L — ABNORMAL LOW (ref 15–41)
Albumin: 3.9 g/dL (ref 3.5–5.0)
Alkaline Phosphatase: 36 U/L — ABNORMAL LOW (ref 38–126)
Anion gap: 7 (ref 5–15)
BUN: 33 mg/dL — ABNORMAL HIGH (ref 8–23)
CO2: 27 mmol/L (ref 22–32)
Calcium: 10.1 mg/dL (ref 8.9–10.3)
Chloride: 101 mmol/L (ref 98–111)
Creatinine, Ser: 1.12 mg/dL — ABNORMAL HIGH (ref 0.44–1.00)
GFR, Estimated: 51 mL/min — ABNORMAL LOW (ref >60)
Glucose, Bld: 134 mg/dL — ABNORMAL HIGH (ref 70–99)
Potassium: 4.2 mmol/L (ref 3.5–5.1)
Sodium: 135 mmol/L (ref 135–145)
Total Bilirubin: 1.2 mg/dL (ref 0.3–1.2)
Total Protein: 5.9 g/dL — ABNORMAL LOW (ref 6.5–8.1)

## 2022-03-13 MED ORDER — HEPARIN SOD (PORK) LOCK FLUSH 100 UNIT/ML IV SOLN
500.0000 [IU] | Freq: Once | INTRAVENOUS | Status: AC | PRN
  Administered 2022-03-13: 500 [IU]

## 2022-03-13 MED ORDER — SODIUM CHLORIDE 0.9 % IV SOLN
75.0000 mg/m2 | Freq: Once | INTRAVENOUS | Status: AC
  Administered 2022-03-13: 120 mg via INTRAVENOUS
  Filled 2022-03-13: qty 12

## 2022-03-13 MED ORDER — SODIUM CHLORIDE 0.9% FLUSH
10.0000 mL | Freq: Once | INTRAVENOUS | Status: AC
  Administered 2022-03-13: 10 mL

## 2022-03-13 MED ORDER — PALONOSETRON HCL INJECTION 0.25 MG/5ML
0.2500 mg | Freq: Once | INTRAVENOUS | Status: AC
  Administered 2022-03-13: 0.25 mg via INTRAVENOUS
  Filled 2022-03-13: qty 5

## 2022-03-13 MED ORDER — SODIUM CHLORIDE 0.9 % IV SOLN
10.0000 mg | Freq: Once | INTRAVENOUS | Status: AC
  Administered 2022-03-13: 10 mg via INTRAVENOUS
  Filled 2022-03-13: qty 10

## 2022-03-13 MED ORDER — SODIUM CHLORIDE 0.9 % IV SOLN: Freq: Once | INTRAVENOUS | Status: AC

## 2022-03-13 MED ORDER — SODIUM CHLORIDE 0.9% FLUSH
10.0000 mL | INTRAVENOUS | Status: DC | PRN
  Administered 2022-03-13: 10 mL

## 2022-03-13 NOTE — Progress Notes (Signed)
Patient is having infusion today and wanted to stay accessed for infusion on 3/21. ? ?

## 2022-03-13 NOTE — Patient Instructions (Signed)
[facility]  Discharge Instructions: Thank you for choosing [facility] to provide your oncology and hematology care.   If you have a lab appointment with the Cancer Center, please go directly to the Cancer Center and check in at the registration area.   Wear comfortable clothing and clothing appropriate for easy access to any Portacath or PICC line.   We strive to give you quality time with your provider. You may need to reschedule your appointment if you arrive late (15 or more minutes).  Arriving late affects you and other patients whose appointments are after yours.  Also, if you miss three or more appointments without notifying the office, you may be dismissed from the clinic at the provider's discretion.      For prescription refill requests, have your pharmacy contact our office and allow 72 hours for refills to be completed.   Today you received the following chemotherapy and/or immunotherapy agents Vidaza     To help prevent nausea and vomiting after your treatment, we encourage you to take your nausea medication as directed.  BELOW ARE SYMPTOMS THAT SHOULD BE REPORTED IMMEDIATELY: *FEVER GREATER THAN 100.4 F (38 C) OR HIGHER *CHILLS OR SWEATING *NAUSEA AND VOMITING THAT IS NOT CONTROLLED WITH YOUR NAUSEA MEDICATION *UNUSUAL SHORTNESS OF BREATH *UNUSUAL BRUISING OR BLEEDING *URINARY PROBLEMS (pain or burning when urinating, or frequent urination) *BOWEL PROBLEMS (unusual diarrhea, constipation, pain near the anus) TENDERNESS IN MOUTH AND THROAT WITH OR WITHOUT PRESENCE OF ULCERS (sore throat, sores in mouth, or a toothache) UNUSUAL RASH, SWELLING OR PAIN  UNUSUAL VAGINAL DISCHARGE OR ITCHING   Items with * indicate a potential emergency and should be followed up as soon as possible or go to the Emergency Department if any problems should occur.  Please show the CHEMOTHERAPY ALERT CARD or IMMUNOTHERAPY ALERT CARD at check-in to the  Emergency Department and triage nurse.  Should you have questions after your visit or need to cancel or reschedule your appointment, please contact [facility]  Dept: [phone]  and follow the prompts.  Office hours are 8:00 a.m. to 4:30 p.m. Monday - Friday. Please note that voicemails left after 4:00 p.m. may not be returned until the following business day.  We are closed weekends and major holidays. You have access to a nurse at all times for urgent questions. Please call the main number to the clinic Dept: [phone] and follow the prompts.   For any non-urgent questions, you may also contact your provider using MyChart. We now offer e-Visits for anyone 18 and older to request care online for non-urgent symptoms. For details visit [email or web address].   Also download the MyChart app! Go to the app store, search "MyChart", open the app, select [facility], and log in with your MyChart username and password.  Due to Covid, a mask is required upon entering the hospital/clinic. If you do not have a mask, one will be given to you upon arrival. For doctor visits, patients may have 1 support person aged 18 or older with them. For treatment visits, patients cannot have anyone with them due to current Covid guidelines and our immunocompromised population.   

## 2022-03-14 ENCOUNTER — Inpatient Hospital Stay: Payer: Medicare Other

## 2022-03-14 ENCOUNTER — Other Ambulatory Visit: Payer: Self-pay

## 2022-03-14 VITALS — BP 133/86 | HR 93 | Temp 98.9°F | Resp 18

## 2022-03-14 DIAGNOSIS — Z5111 Encounter for antineoplastic chemotherapy: Secondary | ICD-10-CM | POA: Diagnosis not present

## 2022-03-14 DIAGNOSIS — E222 Syndrome of inappropriate secretion of antidiuretic hormone: Secondary | ICD-10-CM

## 2022-03-14 DIAGNOSIS — C931 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: Secondary | ICD-10-CM

## 2022-03-14 DIAGNOSIS — R9431 Abnormal electrocardiogram [ECG] [EKG]: Secondary | ICD-10-CM

## 2022-03-14 MED ORDER — SODIUM CHLORIDE 0.9 % IV SOLN
75.0000 mg/m2 | Freq: Once | INTRAVENOUS | Status: AC
  Administered 2022-03-14: 120 mg via INTRAVENOUS
  Filled 2022-03-14: qty 12

## 2022-03-14 MED ORDER — SODIUM CHLORIDE 0.9 % IV SOLN
10.0000 mg | Freq: Once | INTRAVENOUS | Status: AC
  Administered 2022-03-14: 10 mg via INTRAVENOUS
  Filled 2022-03-14: qty 10

## 2022-03-14 MED ORDER — SODIUM CHLORIDE 0.9 % IV SOLN: Freq: Once | INTRAVENOUS | Status: AC

## 2022-03-14 MED ORDER — HEPARIN SOD (PORK) LOCK FLUSH 100 UNIT/ML IV SOLN
500.0000 [IU] | Freq: Once | INTRAVENOUS | Status: AC | PRN
  Administered 2022-03-14: 500 [IU]

## 2022-03-14 MED ORDER — SODIUM CHLORIDE 0.9% FLUSH
10.0000 mL | INTRAVENOUS | Status: DC | PRN
  Administered 2022-03-14: 10 mL

## 2022-03-14 NOTE — Progress Notes (Unsigned)
Burr Medico MD and [name] MD consulted on EKG results. Feng MD instructed pt to take 1 extra 79m Metoprolol pill when she gets home. Feng MD to call pt in the morning. Pt asymptomatic, reporting no chest pain/SOB/headache/dizziness. RN advised pt to seek emergency care if she becomes symptomatic. Pt expressed understanding of plan.  ?

## 2022-03-14 NOTE — Progress Notes (Unsigned)
Per Burr Medico MD, continue with treatment today despite pulse in the 150s and results of EKG. MD has contacted pt cardiologist.  ?

## 2022-03-14 NOTE — Patient Instructions (Signed)
[facility]  Discharge Instructions: Thank you for choosing [facility] to provide your oncology and hematology care.   If you have a lab appointment with the Cancer Center, please go directly to the Cancer Center and check in at the registration area.   Wear comfortable clothing and clothing appropriate for easy access to any Portacath or PICC line.   We strive to give you quality time with your provider. You may need to reschedule your appointment if you arrive late (15 or more minutes).  Arriving late affects you and other patients whose appointments are after yours.  Also, if you miss three or more appointments without notifying the office, you may be dismissed from the clinic at the provider's discretion.      For prescription refill requests, have your pharmacy contact our office and allow 72 hours for refills to be completed.   Today you received the following chemotherapy and/or immunotherapy agents Vidaza     To help prevent nausea and vomiting after your treatment, we encourage you to take your nausea medication as directed.  BELOW ARE SYMPTOMS THAT SHOULD BE REPORTED IMMEDIATELY: *FEVER GREATER THAN 100.4 F (38 C) OR HIGHER *CHILLS OR SWEATING *NAUSEA AND VOMITING THAT IS NOT CONTROLLED WITH YOUR NAUSEA MEDICATION *UNUSUAL SHORTNESS OF BREATH *UNUSUAL BRUISING OR BLEEDING *URINARY PROBLEMS (pain or burning when urinating, or frequent urination) *BOWEL PROBLEMS (unusual diarrhea, constipation, pain near the anus) TENDERNESS IN MOUTH AND THROAT WITH OR WITHOUT PRESENCE OF ULCERS (sore throat, sores in mouth, or a toothache) UNUSUAL RASH, SWELLING OR PAIN  UNUSUAL VAGINAL DISCHARGE OR ITCHING   Items with * indicate a potential emergency and should be followed up as soon as possible or go to the Emergency Department if any problems should occur.  Please show the CHEMOTHERAPY ALERT CARD or IMMUNOTHERAPY ALERT CARD at check-in to the  Emergency Department and triage nurse.  Should you have questions after your visit or need to cancel or reschedule your appointment, please contact [facility]  Dept: [phone]  and follow the prompts.  Office hours are 8:00 a.m. to 4:30 p.m. Monday - Friday. Please note that voicemails left after 4:00 p.m. may not be returned until the following business day.  We are closed weekends and major holidays. You have access to a nurse at all times for urgent questions. Please call the main number to the clinic Dept: [phone] and follow the prompts.   For any non-urgent questions, you may also contact your provider using MyChart. We now offer e-Visits for anyone 18 and older to request care online for non-urgent symptoms. For details visit [email or web address].   Also download the MyChart app! Go to the app store, search "MyChart", open the app, select [facility], and log in with your MyChart username and password.  Due to Covid, a mask is required upon entering the hospital/clinic. If you do not have a mask, one will be given to you upon arrival. For doctor visits, patients may have 1 support person aged 18 or older with them. For treatment visits, patients cannot have anyone with them due to current Covid guidelines and our immunocompromised population.   

## 2022-03-14 NOTE — Progress Notes (Unsigned)
Per Burr Medico MD, ok to administer Vidaza today with irregular pulse (pt has hx afib). MD notified of pulse today fluctuating between 40bpm and 150 bpm. MD ordered EKG to be done while pt is in the infusion center.  ?

## 2022-03-15 ENCOUNTER — Telehealth: Payer: Self-pay

## 2022-03-15 NOTE — Telephone Encounter (Signed)
This nurse reached out to patient to see how she was feeling after an episode of rapid A-Fib.  Patient states that she is feeling well today.  She has been keeping and eye on her heart rate and it has been hanging between 95-98.  She states that she was under some stress yesterday and feels that may have been the reason.  But she will keep an eye on her heart rate and anything changes she will call the clinic. No further questions or concerns at this time.   ?

## 2022-03-19 ENCOUNTER — Other Ambulatory Visit: Payer: Self-pay | Admitting: Nurse Practitioner

## 2022-03-19 DIAGNOSIS — C931 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: Secondary | ICD-10-CM

## 2022-03-19 NOTE — Progress Notes (Signed)
?[place]   ?[phone] [phone]   ?Clinic Follow up Note  ? ?Patient Care Team: ?[name], MD as PCP - General (Family Medicine) ?[name], Wonda Cheng, MD as PCP - Cardiology (Cardiology) ?Gavin Pound, MD as Consulting Physician (Rheumatology) ?Truitt Merle, MD as Consulting Physician (Hematology) ?Juanita Craver, MD as Consulting Physician (Gastroenterology) ?Garner Nash, DO as Consulting Physician (Pulmonary Disease) ?03/20/2022 ? ?CHIEF COMPLAINT: Follow up CMML ? 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 DIAGNOSIS:  ? ?-  Monoclonal B-cell population with co-expression of CD5 comprises 17%  ?of all lymphocytes  ?-  See comment  ? ?COMMENT:  ?In addition to the clonal B-cell population, there is a myeloblast  ?population (CD34, CD38, HLA-DR, CD117, CD123 and CD33) that comprises 2% of the total cellular events.  Please see concurrent tissue biopsy (below) for additional work-up and final diagnosis.  ? ? ?FINAL MICROSCOPIC DIAGNOSIS:  ? ?A. SOFT TISSUE MASS, PRE SACRAL, NEEDLE CORE BIOPSY:  ?-  Chronic lymphocytic leukemia/small lymphocytic lymphoma  ?-  Extra medullary hematopoiesis  ?-  See comment  ? ?COMMENT:  ?The biopsy consists of multiple soft tissue cores with lymphoid nodules and a dense hematopoietic infiltrate consistent with extra medullary hematopoiesis.  MPO and E-cadherin highlight myeloid and erythroid precursors respectively.  CD34 highlights increased vasculature and is also positive within the cytoplasm of megakaryocytes.  A few small, immature mononuclear cells appear to be positive for CD34 and CD117. TdT shows rare, scattered positive cells.  CD20 highlights aggregates of B cells which are admixed with CD3 positive T cells.  T cells are an admixture of CD4 and CD8.  The B cells are also positive for CD5, CD23 and Bcl-2.  The B cells do not show significant  staining for CD10, BCL6 or cyclin D1.  CD138 highlights scattered plasma cells which are polytypic by kappa and lambda in situ hybridization.  Flow cytometry performed on the sample (see [facility] [id number]) identified a kappa restricted CD5 positive B-cell population comprising 70% of lymphocytes.  In addition, a small myeloblast population comprised 2% of the total cellular events.  ? ?Overall, the findings are consistent with soft tissue involvement by  ?chronic lymphocytic leukemia/small lymphocytic lymphoma and extra medullary hematopoiesis. In reviewing the patient's CBC data (macrocytic anemia and thrombocytopenia), I would recommend a bone marrow biopsy to assess for marrow involvement by CLL/SLL.  ?  ?12/23/2021 Imaging  ? EXAM: ?CT CHEST, ABDOMEN, AND PELVIS WITH CONTRAST ? ?IMPRESSION: ?1. Slight interval enlargement of a presacral soft tissue mass ?measuring 7.2 x 4.7 cm, previously 6.9 x 4.1 cm on prior MR of the ?pelvis dated 07/04/2021. By report, this represents a biopsy proven ?lymphoma. ?2. Pleural nodule of the dependent right lower lobe overlying the ?posterior right tenth rib and pleural or paraspinous soft tissue ?mass overlying the right aspect of the T10 vertebral body, very ?slightly enlarged compared to prior examination of the chest dated ?06/25/2020, consistent with additional sites of lymphomatous ?involvement given very indolent growth. These could be better ?assessed for metabolic activity by FDG PET/CT if desired. ?3. There is mild, bibasilar predominant pulmonary fibrosis in a ?pattern featuring irregular peripheral interstitial opacity, septal ?thickening, but without clear evidence of subpleural ?bronchiolectasis or honeycombing, with a somewhat asymmetric ?distribution most conspicuously involving the right lower lobe and ?lingula. These findings are significantly worsened when compared to prior examination dated 06/25/2020, particularly in the right lower lobe. Given  interval change,  this may reflect sequelae of interval infection or aspiration, however appearance is generally suspicious for fibrotic interstitial lung disease, and if characterized by ATS pulmonary fibrosis is in an "indeterminate for UIP" pattern, ?differential considerations including both UIP and NSIP. ?4. Emphysema. ?  ?Aortic Atherosclerosis (ICD10-I70.0) and Emphysema (ICD10-J43.9). ?  ?01/05/2022 Pathology Results  ? DIAGNOSIS:  ? 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 ?PERIPHERAL BLOOD:  ?-Macrocytic anemia  ?-Neutrophilic left shift and monocytosis  ?-Thrombocytopenia  ? ?COMMENT:  ?The bone marrow is hypercellular for age with dyspoietic changes  ?involving myeloid cell lines associated with monocytosis and increased number of blastic cells (12%) as primarily seen by morphology, many of which display monocytic features.  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  In this background, there are several predominantly small lymphoid aggregates mostly composed of small lymphoid cells.  By flow cytometry, a minor abnormal B-cell population expressing CD5 is seen and representing 2% of all cells.  This correlate with previously known B-cell lymphoproliferative process.  Correlation with cytogenetic and FISH studies is strongly recommended.  ? ? ?DIAGNOSIS:  ? ?-Increased number of monocytic cells present (25%)  ?-Minor abnormal B-cell population identified.  ?-See comment  ? ?COMMENT:  ?Flow cytometric analysis shows increased number of monocytic cells representing 25% of all cells but without aberrant phenotype or CD34 expression.  A significant CD34-positive blastic population is not identified.  The  lymphoid population shows a minor B-cell population representing 2% of all cells and expressing B-cell antigens including CD20 associated with CD5, CD200 and possibly dim kappa expression.  The latter findings are abnormal and correlate with previously known B-cell lymphoproliferative process.  No significant T-cell phenotypic abnormalities identified.  ?  ?01/12/2022 Initial Diagnosis  ? 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  ?01/12/2022 Cancer Staging  ? Staging form: Chronic Myeloid Leukemia, AJCC 8th Edition ?- Clinical stage from 01/12/2022: Bone marrow blast count (%): 12, Additional clonal changes: Unknown - Signed by Truitt Merle, MD on 01/12/2022 ?Stage prefix: Initial diagnosis ? ?  ?02/06/2022 -  Chemotherapy  ? Patient is on Treatment Plan : MYELODYSPLASIA  Azacitidine IV D1-7 q28d  ?   ? ? ?CURRENT THERAPY:  ?Azacitadine inj daily x7 days q28 days, starting 02/06/22 ?Irradiated blood products, RBC for Hg <8 and plt <20 ? ?INTERVAL HISTORY: Ms. Bortner returns for follow up as scheduled. Last seen by Dr. Burr Medico 03/06/22 and began cycle 2 Azacitadine (daily for 7 days). She had an episode of rapid Afib on 3/21. She is here for lab and toxicity check, presents accompanied by her spouse, she is in a wheelchair.  She notes that she is losing energy and has dyspnea on exertion with short distances. She attributes some of this to difficulty sleeping. She may sleep a few hours in the evening, then gets to bed late, and may have early morning awakening. She denies dizziness or fall.  Bruising is stable but she noticed an episode of blood in the toilet bowl.  This happened 1 other time but she cannot recall the date.  She was constipated but but is now having diarrhea. She continues to have stomach cramps that fluctuate in severity but never really resolve.  She stopped the stool softener.  Denies recent antibiotic use.  She  has leg swelling in the past few weeks.  Heart rate usually stable in the 70s-80s with  oxygen 95-98%.  She continues Eliquis twice daily.  She does not take NSAIDs.  Denies fever, chills, cough, chest pain, signs of UTI, or any other new specific complaints. ? ?All other systems were reviewed with th

## 2022-03-20 ENCOUNTER — Ambulatory Visit: Payer: Medicare Other

## 2022-03-20 ENCOUNTER — Other Ambulatory Visit: Payer: Self-pay

## 2022-03-20 ENCOUNTER — Inpatient Hospital Stay (HOSPITAL_BASED_OUTPATIENT_CLINIC_OR_DEPARTMENT_OTHER): Payer: Medicare Other | Admitting: Nurse Practitioner

## 2022-03-20 ENCOUNTER — Inpatient Hospital Stay: Payer: Medicare Other

## 2022-03-20 ENCOUNTER — Encounter: Payer: Self-pay | Admitting: Nurse Practitioner

## 2022-03-20 VITALS — BP 103/73 | HR 70 | Temp 97.9°F | Resp 16 | Wt 125.6 lb

## 2022-03-20 DIAGNOSIS — T451X5A Adverse effect of antineoplastic and immunosuppressive drugs, initial encounter: Secondary | ICD-10-CM | POA: Diagnosis not present

## 2022-03-20 DIAGNOSIS — D61810 Antineoplastic chemotherapy induced pancytopenia: Secondary | ICD-10-CM | POA: Diagnosis not present

## 2022-03-20 DIAGNOSIS — Z5111 Encounter for antineoplastic chemotherapy: Secondary | ICD-10-CM | POA: Diagnosis not present

## 2022-03-20 DIAGNOSIS — C931 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: Secondary | ICD-10-CM

## 2022-03-20 LAB — CBC WITH DIFFERENTIAL/PLATELET
Abs Immature Granulocytes: 0 10*3/uL (ref 0.00–0.07)
Basophils Absolute: 0 10*3/uL (ref 0.0–0.1)
Basophils Relative: 0 %
Eosinophils Absolute: 0 10*3/uL (ref 0.0–0.5)
Eosinophils Relative: 0 %
HCT: 23.7 % — ABNORMAL LOW (ref 36.0–46.0)
Hemoglobin: 7.7 g/dL — ABNORMAL LOW (ref 12.0–15.0)
Immature Granulocytes: 0 %
Lymphocytes Relative: 43 %
Lymphs Abs: 0.4 10*3/uL — ABNORMAL LOW (ref 0.7–4.0)
MCH: 34.7 pg — ABNORMAL HIGH (ref 26.0–34.0)
MCHC: 32.5 g/dL (ref 30.0–36.0)
MCV: 106.8 fL — ABNORMAL HIGH (ref 80.0–100.0)
Monocytes Absolute: 0.1 10*3/uL (ref 0.1–1.0)
Monocytes Relative: 9 %
Neutro Abs: 0.5 10*3/uL — ABNORMAL LOW (ref 1.7–7.7)
Neutrophils Relative %: 48 %
Platelets: 22 10*3/uL — ABNORMAL LOW (ref 150–400)
RBC: 2.22 MIL/uL — ABNORMAL LOW (ref 3.87–5.11)
RDW: 15.4 % (ref 11.5–15.5)
WBC: 1 10*3/uL — ABNORMAL LOW (ref 4.0–10.5)
nRBC: 0 % (ref 0.0–0.2)

## 2022-03-20 LAB — SAMPLE TO BLOOD BANK

## 2022-03-20 LAB — COMPREHENSIVE METABOLIC PANEL
ALT: 12 U/L (ref 0–44)
AST: 13 U/L — ABNORMAL LOW (ref 15–41)
Albumin: 3.6 g/dL (ref 3.5–5.0)
Alkaline Phosphatase: 34 U/L — ABNORMAL LOW (ref 38–126)
Anion gap: 10 (ref 5–15)
BUN: 27 mg/dL — ABNORMAL HIGH (ref 8–23)
CO2: 25 mmol/L (ref 22–32)
Calcium: 8.9 mg/dL (ref 8.9–10.3)
Chloride: 100 mmol/L (ref 98–111)
Creatinine, Ser: 1.03 mg/dL — ABNORMAL HIGH (ref 0.44–1.00)
GFR, Estimated: 57 mL/min — ABNORMAL LOW (ref >60)
Glucose, Bld: 155 mg/dL — ABNORMAL HIGH (ref 70–99)
Potassium: 3.9 mmol/L (ref 3.5–5.1)
Sodium: 135 mmol/L (ref 135–145)
Total Bilirubin: 1 mg/dL (ref 0.3–1.2)
Total Protein: 5.8 g/dL — ABNORMAL LOW (ref 6.5–8.1)

## 2022-03-20 LAB — PREPARE RBC (CROSSMATCH)

## 2022-03-20 MED ORDER — HEPARIN SOD (PORK) LOCK FLUSH 100 UNIT/ML IV SOLN
500.0000 [IU] | INTRAVENOUS | Status: AC | PRN
  Administered 2022-03-20: 500 [IU]

## 2022-03-20 MED ORDER — SODIUM CHLORIDE 0.9% IV SOLUTION
250.0000 mL | Freq: Once | INTRAVENOUS | Status: AC
  Administered 2022-03-20: 250 mL via INTRAVENOUS

## 2022-03-20 MED ORDER — FUROSEMIDE 10 MG/ML IJ SOLN
20.0000 mg | Freq: Once | INTRAMUSCULAR | Status: AC
  Administered 2022-03-20: 20 mg via INTRAVENOUS

## 2022-03-20 MED ORDER — SODIUM CHLORIDE 0.9% FLUSH
10.0000 mL | INTRAVENOUS | Status: AC | PRN
  Administered 2022-03-20: 10 mL

## 2022-03-20 MED ORDER — SODIUM CHLORIDE 0.9% FLUSH
10.0000 mL | INTRAVENOUS | Status: DC | PRN
  Administered 2022-03-20: 10 mL via INTRAVENOUS

## 2022-03-20 MED ORDER — HEPARIN SOD (PORK) LOCK FLUSH 100 UNIT/ML IV SOLN
500.0000 [IU] | Freq: Once | INTRAVENOUS | Status: AC
  Administered 2022-03-20: 500 [IU] via INTRAVENOUS

## 2022-03-20 MED ORDER — HEPARIN SOD (PORK) LOCK FLUSH 100 UNIT/ML IV SOLN: 250.0000 [IU] | INTRAVENOUS | Status: DC | PRN

## 2022-03-20 NOTE — Progress Notes (Signed)
This nurse spoke with infusion nurse and was asked to contact this patients Cardiologist to schedule an appointment related to elevated heart rate.  This nurse reached out to Dr. Elmarie Shiley office and setup an appointment for Friday March 31st at 10:40 am at the [facility] location.  Patient made aware of appointment today.  No further concerns at this time.   ?

## 2022-03-20 NOTE — Patient Instructions (Signed)
Blood Transfusion, Adult, Care After ?This sheet gives you information about how to care for yourself after your procedure. Your doctor may also give you more specific instructions. If you have problems or questions, contact your doctor. ?What can I expect after the procedure? ?After the procedure, it is common to have: ?Bruising and soreness at the IV site. ?A headache. ?Follow these instructions at home: ?Insertion site care ?  ?Follow instructions from your doctor about how to take care of your insertion site. This is where an IV tube was put into your vein. Make sure you: ?Wash your hands with soap and water before and after you change your bandage (dressing). If you cannot use soap and water, use hand sanitizer. ?Change your bandage as told by your doctor. ?Check your insertion site every day for signs of infection. Check for: ?Redness, swelling, or pain. ?Bleeding from the site. ?Warmth. ?Pus or a bad smell. ?General instructions ?Take over-the-counter and prescription medicines only as told by your doctor. ?Rest as told by your doctor. ?Go back to your normal activities as told by your doctor. ?Keep all follow-up visits as told by your doctor. This is important. ?Contact a doctor if: ?You have itching or red, swollen areas of skin (hives). ?You feel worried or nervous (anxious). ?You feel weak after doing your normal activities. ?You have redness, swelling, warmth, or pain around the insertion site. ?You have blood coming from the insertion site, and the blood does not stop with pressure. ?You have pus or a bad smell coming from the insertion site. ?Get help right away if: ?You have signs of a serious reaction. This may be coming from an allergy or the body's defense system (immune system). Signs include: ?Trouble breathing or shortness of breath. ?Swelling of the face or feeling warm (flushed). ?Fever or chills. ?Head, chest, or back pain. ?Dark pee (urine) or blood in the pee. ?Widespread rash. ?Fast  heartbeat. ?Feeling dizzy or light-headed. ?You may receive your blood transfusion in an outpatient setting. If so, you will be told whom to contact to report any reactions. ?These symptoms may be an emergency. Do not wait to see if the symptoms will go away. Get medical help right away. Call your local emergency services (911 in the [place] Do not drive yourself to the hospital. ?Summary ?Bruising and soreness at the IV site are common. ?Check your insertion site every day for signs of infection. ?Rest as told by your doctor. Go back to your normal activities as told by your doctor. ?Get help right away if you have signs of a serious reaction. ?This information is not intended to replace advice given to you by your health care provider. Make sure you discuss any questions you have with your health care provider. ?Thrombocytopenia ?Thrombocytopenia means that you have a low number of platelets in your blood. Platelets are tiny cells in the blood. When you bleed, they clump together at the cut or injury to stop the bleeding. This is called blood clotting. ?If you do not have enough platelets, your blood may have trouble clotting. This may cause you to bleed and bruise very easily. ?What are the causes? ?This condition is caused by a low number of platelets in your blood. There are three main reasons for this: ?Your body not making enough platelets. This may be caused by: ?Bone marrow diseases. ?Disorders that are passed from parent to child (inherited). ?Certain cancer medicines or treatments. ?Infection from germs (bacteria or viruses). ?Alcoholism. ?Platelets not being released  in the blood. This can be caused by: ?Having a spleen that is larger than normal. ?A condition called Gaucher disease. ?Your body destroying platelets too quickly. This may be caused by: ?Certain autoimmune diseases. ?Some medicines that thin your blood. ?Certain blood clotting disorders. ?Certain bleeding disorders. ?Exposure to harmful  (toxic) chemicals. ?Pregnancy. ?What are the signs or symptoms? ?Bruising easily. ?Bleeding from the nose or mouth. ?Heavy menstrual periods. ?Blood in the pee (urine), poop (stool), or vomit. ?A purple-red color to the skin (purpura). ?A rash that looks like pinpoint, purple-red spots (petechiae) on the lower legs. ?How is this treated? ?Treatment depends on the cause. Treatment may include: ?Treatment of another condition that is causing the low platelet count. ?Medicines to help protect your platelets from being destroyed. ?A replacement (transfusion) of platelets to stop or prevent bleeding. ?Surgery to take out the spleen. ?Follow these instructions at home: ?Medicines ?Take over-the-counter and prescription medicines only as told by your doctor. ?Do not take any medicines that have aspirin or NSAIDs, such as ibuprofen. ?Activity ?Avoid doing things that could hurt or bruise you. Take action to prevent falls. ?Do not play contact sports. ?Ask your doctor what activities are safe for you. ?Take care not to burn yourself: ?When you use an iron. ?When you cook. ?Take care not to cut yourself: ?When you shave. ?When you use scissors, needles, knives, or other tools. ?General instructions ? ?Check your skin and the inside of your mouth for bruises or blood as told by your doctor. ?Wear a medical alert bracelet that says that you have a bleeding disorder. ?Check to see if there is blood in your pee and poop. Do this as told by your doctor. ?Do not drink alcohol. If you do drink, limit the amount that you drink. ?Stay away from harmful (toxic) chemicals. ?Tell all of your doctors that you have this condition. Be sure to tell your dentist and eye doctor. Tell your dentist about your condition before you have your teeth cleaned. ?Keep all follow-up visits. ?Contact a doctor if: ?You have bruises and you do not know why. ?You have new symptoms. ?You have symptoms that get worse. ?You have a fever. ?Get help right away  if: ?You have very bad bleeding anywhere on your body. ?You have blood in your vomit, pee, or poop. ?You have an injury to your head. ?You have a sudden, very bad headache. ?Summary ?Thrombocytopenia means that you have a low number of platelets in your blood. ?Platelets stick together to form a clot. ?Symptoms of this condition include getting bruises easily, bleeding from the mouth and nose, a purple-red color to the skin, and a rash. ?Take care not to cut or burn yourself. ?This information is not intended to replace advice given to you by your health care provider. Make sure you discuss any questions you have with your health care provider. ?Document Revised: 05/26/2021 Document Reviewed: 05/26/2021 ?Elsevier Patient Education ? [place]. ? ?Document Revised: 04/07/2021 Document Reviewed: 06/05/2019 ?Elsevier Patient Education ? [place]. ? ?

## 2022-03-20 NOTE — Progress Notes (Signed)
Pt. heart rate 144-156 and apical check 114. Pt. denies complaints of chest pain, dizziness, and no respiratory distress noted. Cira Rue, NP notified and ok to continue with blood transfusion, no other orders received. Pt. to follow up with Cardiologist. ?

## 2022-03-20 NOTE — Addendum Note (Signed)
Addended by: Bertram Millard A on: 03/20/2022 03:41 PM ? ? Modules accepted: Orders ? ?

## 2022-03-21 LAB — BPAM RBC
Blood Product Expiration Date: 202304162359
ISSUE DATE / TIME: 202303271255
Unit Type and Rh: 7300

## 2022-03-21 LAB — TYPE AND SCREEN
ABO/RH(D): B POS
Antibody Screen: NEGATIVE
Unit division: 0

## 2022-03-21 LAB — BPAM PLATELET PHERESIS
Blood Product Expiration Date: 202303292359
ISSUE DATE / TIME: 202303271138
Unit Type and Rh: 5100

## 2022-03-21 LAB — PREPARE PLATELET PHERESIS: Unit division: 0

## 2022-03-22 ENCOUNTER — Telehealth: Payer: Self-pay

## 2022-03-22 ENCOUNTER — Telehealth: Payer: Self-pay | Admitting: Nurse Practitioner

## 2022-03-22 NOTE — Telephone Encounter (Signed)
Scheduled follow-up appointments per 3/27 los. Patient's husband is aware. ?

## 2022-03-22 NOTE — Telephone Encounter (Signed)
Pt's husband called stating pt is SOB, hot, and real fatigue.  Pt has a hx of Afib. Pt's HR is 60 at time of episode.  Pt's spouse confirmed that pt took her Eliquis.  Pt's spouse stated the pt has these episodes and then feel just fine about 2 to 3 hours later.  Pt has a follow-up appt scheduled on 03/24/2022 with her cardiologist office.   ?

## 2022-03-23 ENCOUNTER — Inpatient Hospital Stay: Payer: Medicare Other | Admitting: Nurse Practitioner

## 2022-03-23 ENCOUNTER — Telehealth: Payer: Self-pay | Admitting: Cardiovascular Disease

## 2022-03-23 ENCOUNTER — Other Ambulatory Visit: Payer: Medicare Other

## 2022-03-23 ENCOUNTER — Inpatient Hospital Stay: Payer: Medicare Other

## 2022-03-23 ENCOUNTER — Other Ambulatory Visit: Payer: Self-pay

## 2022-03-23 DIAGNOSIS — E790 Hyperuricemia without signs of inflammatory arthritis and tophaceous disease: Secondary | ICD-10-CM | POA: Diagnosis not present

## 2022-03-23 DIAGNOSIS — C931 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: Secondary | ICD-10-CM

## 2022-03-23 DIAGNOSIS — M0579 Rheumatoid arthritis with rheumatoid factor of multiple sites without organ or systems involvement: Secondary | ICD-10-CM | POA: Diagnosis not present

## 2022-03-23 DIAGNOSIS — Z95828 Presence of other vascular implants and grafts: Secondary | ICD-10-CM

## 2022-03-23 DIAGNOSIS — M1991 Primary osteoarthritis, unspecified site: Secondary | ICD-10-CM | POA: Diagnosis not present

## 2022-03-23 DIAGNOSIS — C9110 Chronic lymphocytic leukemia of B-cell type not having achieved remission: Secondary | ICD-10-CM | POA: Diagnosis not present

## 2022-03-23 DIAGNOSIS — Z79899 Other long term (current) drug therapy: Secondary | ICD-10-CM | POA: Diagnosis not present

## 2022-03-23 DIAGNOSIS — Z5111 Encounter for antineoplastic chemotherapy: Secondary | ICD-10-CM | POA: Diagnosis not present

## 2022-03-23 LAB — CBC WITH DIFFERENTIAL/PLATELET
Abs Immature Granulocytes: 0.05 10*3/uL (ref 0.00–0.07)
Basophils Absolute: 0 10*3/uL (ref 0.0–0.1)
Basophils Relative: 0 %
Eosinophils Absolute: 0 10*3/uL (ref 0.0–0.5)
Eosinophils Relative: 0 %
HCT: 26.9 % — ABNORMAL LOW (ref 36.0–46.0)
Hemoglobin: 8.7 g/dL — ABNORMAL LOW (ref 12.0–15.0)
Immature Granulocytes: 3 %
Lymphocytes Relative: 50 %
Lymphs Abs: 0.7 10*3/uL (ref 0.7–4.0)
MCH: 33.6 pg (ref 26.0–34.0)
MCHC: 32.3 g/dL (ref 30.0–36.0)
MCV: 103.9 fL — ABNORMAL HIGH (ref 80.0–100.0)
Monocytes Absolute: 0.1 10*3/uL (ref 0.1–1.0)
Monocytes Relative: 7 %
Neutro Abs: 0.6 10*3/uL — ABNORMAL LOW (ref 1.7–7.7)
Neutrophils Relative %: 40 %
Platelets: 31 10*3/uL — ABNORMAL LOW (ref 150–400)
RBC: 2.59 MIL/uL — ABNORMAL LOW (ref 3.87–5.11)
RDW: 17.3 % — ABNORMAL HIGH (ref 11.5–15.5)
WBC: 1.5 10*3/uL — ABNORMAL LOW (ref 4.0–10.5)
nRBC: 0 % (ref 0.0–0.2)

## 2022-03-23 MED ORDER — SODIUM CHLORIDE 0.9% FLUSH
10.0000 mL | Freq: Once | INTRAVENOUS | Status: AC
  Administered 2022-03-23: 10 mL

## 2022-03-23 NOTE — Telephone Encounter (Signed)
Spoke with patient regarding SOB. ? ?Patient states she has been more short of breath with activity over the last 3-4 days. SOB is relieved with rest. Patient states she has chronic swelling of the ankles, denies any recent increase in swelling. Patient denies CP/dizziness/fatigue, no change in appetite reported. ? ?No vitals or weights to report. ? ?Patient has appointment with Laurann Montana, NP 03/24/22. ? ?Patient states she does not need to be seen today and will see NP tomorrow to discuss her SOB. ?

## 2022-03-23 NOTE — Telephone Encounter (Signed)
Pt c/o Shortness Of Breath: STAT if SOB developed within the last 24 hours or pt is noticeably SOB on the phone ? ?1. Are you currently SOB (can you hear that pt is SOB on the phone)? no ? ?2. How long have you been experiencing SOB? 3 - 4 days  ? ?3. Are you SOB when sitting or when up moving around? Only on exertion  ? ?4. Are you currently experiencing any other symptoms? No  ? ?

## 2022-03-24 ENCOUNTER — Encounter (HOSPITAL_BASED_OUTPATIENT_CLINIC_OR_DEPARTMENT_OTHER): Payer: Self-pay | Admitting: Family

## 2022-03-24 ENCOUNTER — Ambulatory Visit (INDEPENDENT_AMBULATORY_CARE_PROVIDER_SITE_OTHER): Payer: Medicare Other

## 2022-03-24 ENCOUNTER — Ambulatory Visit (INDEPENDENT_AMBULATORY_CARE_PROVIDER_SITE_OTHER): Payer: Medicare Other | Admitting: Family

## 2022-03-24 VITALS — BP 105/64 | HR 78 | Ht 63.0 in | Wt 126.8 lb

## 2022-03-24 DIAGNOSIS — D6859 Other primary thrombophilia: Secondary | ICD-10-CM | POA: Diagnosis not present

## 2022-03-24 DIAGNOSIS — I4891 Unspecified atrial fibrillation: Secondary | ICD-10-CM

## 2022-03-24 DIAGNOSIS — I10 Essential (primary) hypertension: Secondary | ICD-10-CM

## 2022-03-24 DIAGNOSIS — C931 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: Secondary | ICD-10-CM | POA: Diagnosis not present

## 2022-03-24 DIAGNOSIS — D61818 Other pancytopenia: Secondary | ICD-10-CM

## 2022-03-24 DIAGNOSIS — I480 Paroxysmal atrial fibrillation: Secondary | ICD-10-CM | POA: Diagnosis not present

## 2022-03-24 DIAGNOSIS — J449 Chronic obstructive pulmonary disease, unspecified: Secondary | ICD-10-CM | POA: Diagnosis not present

## 2022-03-24 MED ORDER — LOSARTAN POTASSIUM 25 MG PO TABS: 25.0000 mg | ORAL_TABLET | Freq: Every day | ORAL | 1 refills | Status: DC

## 2022-03-24 NOTE — Patient Instructions (Addendum)
Medication Instructions:  ? ?Your physician has recommended you make the following change in your medication:   ? ?REDUCE Losartan to 69m daily ? ?CLoel Dubonnet NP will reach out to Dr. NAcie Fredricksonand your oncology team so we can coordinate whether to continue Eliquis. In the meantime, continue once per day ? ?*If you need a refill on your cardiac medications before your next appointment, please call your pharmacy* ? ?Lab Work: ?None ordered today.  ? ?Testing/Procedures: ?Your physician has recommended that you wear a Zio monitor.  ? ?This monitor is a medical device that records the heart?s electrical activity. Doctors most often use these monitors to diagnose arrhythmias. Arrhythmias are problems with the speed or rhythm of the heartbeat. The monitor is a small device applied to your chest. You can wear one while you do your normal daily activities. While wearing this monitor if you have any symptoms to push the button and record what you felt. Once you have worn this monitor for the period of time provider prescribed (for 3 day), you will return the monitor device in the postage paid box. Once it is returned they will download the data collected and provide u[place]ore[place]ith a report which the provider will then review and we will call you with those results. Important tips: ? ?Avoid showering during the first 24 hours of wearing the monitor. ?Avoid excessive sweating to help maximize wear time. ?Do not submerge the device, no hot tubs, and no swimming pools. ?Keep any lotions or oils away from the patch. ?After 24 hours you may shower with the patch on. Take brief showers with your back facing the shower head.  ?Do not remove patch once it has been placed because that will interrupt data and decrease adhesive wear time. ?Push the button when you have any symptoms and write down what you were feeling. ?Once you have completed wearing your monitor, remove and place into box which has postage paid and place in your  outgoing mailbox.  ?If for some reason you have misplaced your box then call our office and we can provide another box and/or mail it off for you. ? ?Follow-Up: ?[facility] and [facility] are our [facility].  As part of our continuing mission to provide you with exceptional heart care, we have created designated Provider Care Teams.  These Care Teams include your primary Cardiologist (physician) and Advanced Practice Providers (APPs -  Physician Assistants and Nurse Practitioners) who all work together to provide you with the care you need, when you need it. ? ?We recommend signing up for the patient portal called "MyChart".  Sign up information is provided on this After Visit Summary.  MyChart is used to connect with patients for Virtual Visits (Telemedicine).  Patients are able to view lab/test results, encounter notes, upcoming appointments, etc.  Non-urgent messages can be sent to your provider as well.   ?To learn more about what you can do with MyChart, go to [email or web address]   ? ?Your next appointment:   ?In May with Dr. NAcie Fredrickson ?

## 2022-03-24 NOTE — Progress Notes (Signed)
? ?Office Visit  ?  ?Patient Name: [name] ?Date of Encounter: 03/24/2022 ? ?PCP:  [name], MD ?  ?[facility]  ?Cardiologist:  Mertie Moores, MD  ?Advanced Practice Provider:  No care team member to display ?Electrophysiologist:  None    ? ?Chief Complaint  ?  ?[name] is a 76 y.o. female with a hx of CMML, PAF, HTN, COPD presents today for shortness of breath, tachycardia.  ? ?Past Medical History  ?  ?Past Medical History:  ?Diagnosis Date  ? Arthritis   ? Rheumatoid arthritis  ? Celiac disease   ? Chronic kidney disease   ? stage 3 from MD notes  ? COPD (chronic obstructive pulmonary disease) ([place])   ? Dyspnea   ? with going up stairs  ? Family history of adverse reaction to anesthesia   ? father had hard time waking up  ? Headache   ? sinus headaches  ? Hot flashes   ? Hypertension   ? Iron deficiency anemia   ? Pneumonia   ? per patient "I have walking pneumonia"  ? ?Past Surgical History:  ?Procedure Laterality Date  ? COLONOSCOPY    ? ECTOPIC PREGNANCY SURGERY    ?  x 2  ? IR IMAGING GUIDED PORT INSERTION  01/31/2022  ? REVERSE SHOULDER ARTHROPLASTY Right 02/02/2017  ? Procedure: RIGHT REVERSE SHOULDER ARTHROPLASTY;  Surgeon: Netta Cedars, MD;  Location: [facility];  Service: Orthopedics;  Laterality: Right;  ? ? ?Allergies ? ?Allergies  ?Allergen Reactions  ? Gluten Meal Diarrhea  ? Penicillins Rash  ?  Many years ago ?  ? Fosamax [Alendronate] Rash  ? Hydroxychloroquine Rash  ? ? ?History of Present Illness  ?  ?[name] is a 76 y.o. female with a hx of paroxysmal atrial fibrillation, CKD, HLD, COPD, CMML last seen 01/10/22 by Dr. Acie Fredrickson. ? ?She was seen in consult by Dr. Acie Fredrickson 09/2021 for atrial fibrillation after referral from primary care. She was overall unaware of her atrial fibrillation and not yet anticoagulated. There was notation of previous GI bleed and colonoscopy by PCP. She was started on Eliquis 60m BID and echocardiogram obtained 10/27/21  revealed normal LVEF 60-65%, no RWMA, normal diastolic parameters, LA moderately dilated, RA mildly dilated, mild MR. She was seen by Dr. NAcie Fredrickson1/17/23 and clearance provided for colonoscopy.  ? ?She is followed by oncology due to CMML with Azacitadine injection daily x 7 days every 28 days after pelvic MRI 06/2021 with mass in presacral space and bone marrow boipsy 01/05/22 started 02/06/22 and irradiated blood product with RBC for Hb <8 and Plt <20. Plan to restage after cycle 3.  ? ?Seen by pulmonology 03/10/21 due to COPD stage 2. She noted shortness of breath with exertion. [facility] ? ?She had an episode of rapid atrial fibrillation on 03/14/22 at oncology office. EKG atrial fib 150 bpm with heart rate per documentation 40bpm - 150bpm. She was instructed to take extra 538mMetoprolol in the evening. When seen 03/20/22 noted episode of blood in toilet bowl and given Hb 7.7, Plt 22K she was given 1u PRBC, 1u platelets. Her Eliquis was reduced to 67m38mID. Labs yesterday with Hb 8.7, Plt 31K.  ? ?She presents today for follow up with her husband. Denies palpitations, orthopnea, PND. Her lower extremity edema is similar compared to previous. She wears compression stockings daily and while elevation does not resolve her swelling, does prevent  it from worsening. She and her husband note one week history of increasing dyspnea with activity such as walking to the bathroom.  Takes her about 5 minutes to recover.  No coughing or wheezing. Her husband does note she coughs in the evening. HR today 90 but heart rate usually in 70s when checked at home.Takes her Metoprolol, Losartan, and Eliquis in the morning. At the direction of her oncology team she is taking her Eliquis only once per day. Shares with me that she cut out her vitamins as they were "too much distraction". ? ? ?EKGs/Labs/Other Studies Reviewed:  ? ?The following studies were reviewed today: ? ?Echo 10/27/21 ? 1. Left  ventricular ejection fraction, by estimation, is 60 to 65%. The  ?left ventricle has normal function. The left ventricle has no regional  ?wall motion abnormalities. Left ventricular diastolic parameters were  ?normal.  ? 2. Right ventricular systolic function is normal. The right ventricular  ?size is normal. There is normal pulmonary artery systolic pressure.  ? 3. Left atrial size was moderately dilated.  ? 4. Right atrial size was mildly dilated.  ? 5. The mitral valve is normal in structure. Mild mitral valve  ?regurgitation. No evidence of mitral stenosis.  ? 6. The aortic valve is tricuspid. Aortic valve regurgitation is not  ?visualized. No aortic stenosis is present.  ? 7. The inferior vena cava is normal in size with greater than 50%  ?respiratory variability, suggesting right atrial pressure of 3 mmHg.  ? ?EKG:  EKG is not ordered today.  The ekg independently reviewed from 03/14/22 demonstrated atrial fib 150 bpm with no acute St/T wave change.  ? ?Recent Labs: ?03/20/2022: ALT 12; BUN 27; Creatinine, Ser 1.03; Potassium 3.9; Sodium 135 ?03/23/2022: Hemoglobin 8.7; Platelets 31  ?Recent Lipid Panel ?No results found for: CHOL, TRIG, HDL, CHOLHDL, VLDL, LDLCALC, LDLDIRECT ? ?Risk Assessment/Calculations:  ? ?CHA2DS2-VASc Score = 4  ? This indicates a 4.8% annual risk of stroke. ?The patient's score is based upon: ?CHF History: 0 ?HTN History: 1 ?Diabetes History: 0 ?Stroke History: 0 ?Vascular Disease History: 0 ?Age Score: 2 ?Gender Score: 1 ?  ? ? ?Home Medications  ? ?Current Meds  ?Medication Sig  ? acetaminophen (TYLENOL) 500 MG tablet Take 1,000 mg by mouth every 6 (six) hours as needed for mild pain.  ? apixaban (ELIQUIS) 5 MG TABS tablet Take 1 tablet (5 mg total) by mouth 2 (two) times daily.  ? Budeson-Glycopyrrol-Formoterol (BREZTRI AEROSPHERE) 160-9-4.8 MCG/ACT AERO Inhale 2 puffs into the lungs in the morning and at bedtime.  ? Calcium-Magnesium-Vitamin D (CALCIUM 1200+D3 PO) Take 1 tablet by  mouth daily.  ? denosumab (PROLIA) 60 MG/ML SOSY injection Inject 60 mg into the skin every 6 (six) months.  ? gabapentin (NEURONTIN) 300 MG capsule Take 300 mg by mouth 3 (three) times daily.   ? InFLIXimab (REMICADE IV) Inject 10 mg into the vein every 8 (eight) weeks.  ? lidocaine-prilocaine (EMLA) cream Apply to affected area once  ? losartan (COZAAR) 50 MG tablet Take 50 mg by mouth daily.   ? metoprolol succinate (TOPROL-XL) 50 MG 24 hr tablet Take 50 mg by mouth every morning.   ? predniSONE (DELTASONE) 5 MG tablet Take 5 mg by mouth daily. TAKE 1 TABLET ORALLY ONCE A DAY FOR 30 DAYS  ? Tiotropium Bromide-Olodaterol (STIOLTO RESPIMAT) 2.5-2.5 MCG/ACT AERS Inhale 2 puffs into the lungs daily.  ? vitamin C (ASCORBIC ACID) 500 MG tablet Take 500 mg by mouth daily.  ?  ? ?  Review of Systems  ?    ?All other systems reviewed and are otherwise negative except as noted above. ? ?Physical Exam  ?  ?VS:  BP 105/64 (BP Location: Right Arm, Patient Position: Standing, Cuff Size: Normal)   Pulse 78   Ht 5' 3"  (1.6 m)   Wt 126 lb 12.8 oz (57.5 kg)   SpO2 97%   BMI 22.46 kg/m?  , BMI Body mass index is 22.46 kg/m?. ? ?Wt Readings from Last 3 Encounters:  ?03/24/22 126 lb 12.8 oz (57.5 kg)  ?03/20/22 125 lb 9.6 oz (57 kg)  ?03/13/22 127 lb 8 oz (57.8 kg)  ?  ? ?GEN: Frail appearing, well developed, in no acute distress. ?HEENT: normal. ?Neck: Supple, no JVD, carotid bruits, or masses. ?Cardiac: RRR, no murmurs, rubs, or gallops. No clubbing, cyanosis.  Radials/PT 2+ and equal bilaterally. Bilateral LE with nonpitting edema, wearing compression stockings today.  ?Respiratory:  Respirations regular and unlabored, clear to auscultation bilaterally. ?GI: Soft, nontender, nondistended. ?MS: No deformity or atrophy. ?Skin: Warm and dry, no rash. ?Neuro:  Strength and sensation are intact. ?Psych: Normal affect. ? ?Assessment & Plan  ?  ?PAF / Hypercoagulable state - SR by auscultation today. Recent EKG 03/14/22 at oncology  with atrial fib 150 bpm. She is asymptomatic in regards to her atrial fibrillation. Echo 10/2021 normal LVEF, mild MR, atria dilated. ZIO monitor placed today for 3 days to assess burden of atrial fibrillation, rate control.

## 2022-03-25 NOTE — Progress Notes (Addendum)
?[facility]   ?[phone] [phone]   ?Clinic Follow up Note  ? ?Patient Care Team: ?[name], MD as PCP - General (Family Medicine) ?[name], Wonda Cheng, MD as PCP - Cardiology (Cardiology) ?Gavin Pound, MD as Consulting Physician (Rheumatology) ?Truitt Merle, MD as Consulting Physician (Hematology) ?Juanita Craver, MD as Consulting Physician (Gastroenterology) ?Garner Nash, DO as Consulting Physician (Pulmonary Disease) ?03/27/2022 ? ?CHIEF COMPLAINT: Follow up CMML, pancytopenia ? ?SUMMARY OF ONCOLOGIC HISTORY: ?Oncology History  ?CMML (chronic myelomonocytic leukemia) ([place])  ?11/09/2021 Initial Biopsy  ? DIAGNOSIS:  ? ?-  Monoclonal B-cell population with co-expression of CD5 comprises 17%  ?of all lymphocytes  ?-  See comment  ? ?COMMENT:  ?In addition to the clonal B-cell population, there is a myeloblast  ?population (CD34, CD38, HLA-DR, CD117, CD123 and CD33) that comprises 2% of the total cellular events.  Please see concurrent tissue biopsy (below) for additional work-up and final diagnosis.  ? ? ?FINAL MICROSCOPIC DIAGNOSIS:  ? ?A. SOFT TISSUE MASS, PRE SACRAL, NEEDLE CORE BIOPSY:  ?-  Chronic lymphocytic leukemia/small lymphocytic lymphoma  ?-  Extra medullary hematopoiesis  ?-  See comment  ? ?COMMENT:  ?The biopsy consists of multiple soft tissue cores with lymphoid nodules and a dense hematopoietic infiltrate consistent with extra medullary hematopoiesis.  MPO and E-cadherin highlight myeloid and erythroid precursors respectively.  CD34 highlights increased vasculature and is also positive within the cytoplasm of megakaryocytes.  A few small, immature mononuclear cells appear to be positive for CD34 and CD117. TdT shows rare, scattered positive cells.  CD20 highlights aggregates of B cells which are admixed with CD3 positive T cells.  T cells are an admixture of CD4 and CD8.  The B cells are also positive for CD5, CD23 and Bcl-2.  The B cells do not show  significant staining for CD10, BCL6 or cyclin D1.  CD138 highlights scattered plasma cells which are polytypic by kappa and lambda in situ hybridization.  Flow cytometry performed on the sample (see WL [id number]) identified a kappa restricted CD5 positive B-cell population comprising 70% of lymphocytes.  In addition, a small myeloblast population comprised 2% of the total cellular events.  ? ?Overall, the findings are consistent with soft tissue involvement by  ?chronic lymphocytic leukemia/small lymphocytic lymphoma and extra medullary hematopoiesis. In reviewing the patient's CBC data (macrocytic anemia and thrombocytopenia), I would recommend a bone marrow biopsy to assess for marrow involvement by CLL/SLL.  ?  ?12/23/2021 Imaging  ? EXAM: ?CT CHEST, ABDOMEN, AND PELVIS WITH CONTRAST ? ?IMPRESSION: ?1. Slight interval enlargement of a presacral soft tissue mass ?measuring 7.2 x 4.7 cm, previously 6.9 x 4.1 cm on prior MR of the ?pelvis dated 07/04/2021. By report, this represents a biopsy proven ?lymphoma. ?2. Pleural nodule of the dependent right lower lobe overlying the ?posterior right tenth rib and pleural or paraspinous soft tissue ?mass overlying the right aspect of the T10 vertebral body, very ?slightly enlarged compared to prior examination of the chest dated ?06/25/2020, consistent with additional sites of lymphomatous ?involvement given very indolent growth. These could be better ?assessed for metabolic activity by FDG PET/CT if desired. ?3. There is mild, bibasilar predominant pulmonary fibrosis in a ?pattern featuring irregular peripheral interstitial opacity, septal ?thickening, but without clear evidence of subpleural ?bronchiolectasis or honeycombing, with a somewhat asymmetric ?distribution most conspicuously involving the right lower lobe and ?lingula. These findings are significantly worsened when compared to prior examination dated 06/25/2020, particularly in the right lower lobe.  Given  interval change, this may reflect sequelae of interval infection or aspiration, however appearance is generally suspicious for fibrotic interstitial lung disease, and if characterized by ATS pulmonary fibrosis is in an "indeterminate for UIP" pattern, ?differential considerations including both UIP and NSIP. ?4. Emphysema. ?  ?Aortic Atherosclerosis (ICD10-I70.0) and Emphysema (ICD10-J43.9). ?  ?01/05/2022 Pathology Results  ? DIAGNOSIS:  ? 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 ?PERIPHERAL BLOOD:  ?-Macrocytic anemia  ?-Neutrophilic left shift and monocytosis  ?-Thrombocytopenia  ? ?COMMENT:  ?The bone marrow is hypercellular for age with dyspoietic changes  ?involving myeloid cell lines associated with monocytosis and increased number of blastic cells (12%) as primarily seen by morphology, many of which display monocytic features.  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  In this background, there are several predominantly small lymphoid aggregates mostly composed of small lymphoid cells.  By flow cytometry, a minor abnormal B-cell population expressing CD5 is seen and representing 2% of all cells.  This correlate with previously known B-cell lymphoproliferative process.  Correlation with cytogenetic and FISH studies is strongly recommended.  ? ? ?DIAGNOSIS:  ? ?-Increased number of monocytic cells present (25%)  ?-Minor abnormal B-cell population identified.  ?-See comment  ? ?COMMENT:  ?Flow cytometric analysis shows increased number of monocytic cells representing 25% of all cells but without aberrant phenotype or CD34 expression.  A significant CD34-positive blastic population is not  identified.  The lymphoid population shows a minor B-cell population representing 2% of all cells and expressing B-cell antigens including CD20 associated with CD5, CD200 and possibly dim kappa expression.  The latter findings are abnormal and correlate with previously known B-cell lymphoproliferative process.  No significant T-cell phenotypic abnormalities identified.  ?  ?01/12/2022 Initial Diagnosis  ? 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  ?01/12/2022 Cancer Staging  ? Staging form: Chronic Myeloid Leukemia, AJCC 8th Edition ?- Clinical stage from 01/12/2022: Bone marrow blast count (%): 12, Additional clonal changes: Unknown - Signed by Truitt Merle, MD on 01/12/2022 ?Stage prefix: Initial diagnosis ? ?  ?02/06/2022 -  Chemotherapy  ? Patient is on Treatment Plan : MYELODYSPLASIA  Azacitidine IV D1-7 q28d  ?   ? ? ?CURRENT THERAPY: Azacitidine x7 days q28 days, s/p cycle 2 ? ?INTERVAL HISTORY: [name] returns for close f/up for pancytopenia and recent bleeding. She was transfused 1 unit RBCs and 1 u PLTs last week. Her 3/30 CBC showed improvement to hgb 8.7 and plt 31. I reduced eliquis to 5 mg daily given her active bleeding. She saw cardiology for recent Afib episodes, a monitor was placed 03/24/22. Losartan was reduced and eliquis was stopped but she took a dose this morning.  ? ?Today, she presents with her spouse, in a wheelchair. She has progressive dyspnea over the weekend. Dyspnea was exertional at first but for past 2 days her husband reports 2 episodes where she was not breathing well at rest. At times difficult to speak due to dyspnea. She struggles to breath with minimal exertion such as standing up, now husband has to help her. Heart rate also fluctuating with variable duration. Denies chest pain or significant cough. She is unaware when she is in Afib. Leg edema still present, compression stockings are too tight. Her appetite is low and she is  gluten free, does not have many appealing options.  She had soup and a nutrition supplement today. Bowels normal except 1 episode of diarrhea. Denies n/v. She still has abdominal cramping. Denies fever or chills.  ? ?All other systems were reviewed with the patien

## 2022-03-27 ENCOUNTER — Inpatient Hospital Stay: Payer: Medicare Other

## 2022-03-27 ENCOUNTER — Encounter: Payer: Self-pay | Admitting: Nurse Practitioner

## 2022-03-27 ENCOUNTER — Other Ambulatory Visit (HOSPITAL_BASED_OUTPATIENT_CLINIC_OR_DEPARTMENT_OTHER): Payer: Self-pay | Admitting: Family

## 2022-03-27 ENCOUNTER — Inpatient Hospital Stay: Payer: Medicare Other | Attending: Nurse Practitioner

## 2022-03-27 ENCOUNTER — Telehealth: Payer: Self-pay

## 2022-03-27 ENCOUNTER — Telehealth: Payer: Self-pay | Admitting: Nurse Practitioner

## 2022-03-27 ENCOUNTER — Telehealth (HOSPITAL_BASED_OUTPATIENT_CLINIC_OR_DEPARTMENT_OTHER): Payer: Self-pay

## 2022-03-27 ENCOUNTER — Other Ambulatory Visit: Payer: Self-pay

## 2022-03-27 ENCOUNTER — Inpatient Hospital Stay (HOSPITAL_BASED_OUTPATIENT_CLINIC_OR_DEPARTMENT_OTHER): Payer: Medicare Other | Admitting: Nurse Practitioner

## 2022-03-27 VITALS — BP 109/64 | HR 88 | Temp 98.4°F | Resp 18 | Wt 127.4 lb

## 2022-03-27 DIAGNOSIS — S9032XA Contusion of left foot, initial encounter: Secondary | ICD-10-CM | POA: Diagnosis not present

## 2022-03-27 DIAGNOSIS — R0602 Shortness of breath: Secondary | ICD-10-CM | POA: Diagnosis not present

## 2022-03-27 DIAGNOSIS — J189 Pneumonia, unspecified organism: Secondary | ICD-10-CM | POA: Diagnosis not present

## 2022-03-27 DIAGNOSIS — R609 Edema, unspecified: Secondary | ICD-10-CM | POA: Diagnosis not present

## 2022-03-27 DIAGNOSIS — J441 Chronic obstructive pulmonary disease with (acute) exacerbation: Secondary | ICD-10-CM | POA: Diagnosis present

## 2022-03-27 DIAGNOSIS — T451X5A Adverse effect of antineoplastic and immunosuppressive drugs, initial encounter: Secondary | ICD-10-CM | POA: Diagnosis present

## 2022-03-27 DIAGNOSIS — E871 Hypo-osmolality and hyponatremia: Secondary | ICD-10-CM | POA: Diagnosis not present

## 2022-03-27 DIAGNOSIS — I5022 Chronic systolic (congestive) heart failure: Secondary | ICD-10-CM | POA: Diagnosis not present

## 2022-03-27 DIAGNOSIS — Z95828 Presence of other vascular implants and grafts: Secondary | ICD-10-CM

## 2022-03-27 DIAGNOSIS — Y92230 Patient room in hospital as the place of occurrence of the external cause: Secondary | ICD-10-CM | POA: Diagnosis present

## 2022-03-27 DIAGNOSIS — Z20822 Contact with and (suspected) exposure to covid-19: Secondary | ICD-10-CM | POA: Diagnosis not present

## 2022-03-27 DIAGNOSIS — D61818 Other pancytopenia: Secondary | ICD-10-CM | POA: Diagnosis not present

## 2022-03-27 DIAGNOSIS — R2689 Other abnormalities of gait and mobility: Secondary | ICD-10-CM | POA: Diagnosis not present

## 2022-03-27 DIAGNOSIS — R0600 Dyspnea, unspecified: Secondary | ICD-10-CM | POA: Diagnosis not present

## 2022-03-27 DIAGNOSIS — J810 Acute pulmonary edema: Secondary | ICD-10-CM | POA: Diagnosis not present

## 2022-03-27 DIAGNOSIS — C931 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: Secondary | ICD-10-CM

## 2022-03-27 DIAGNOSIS — R0609 Other forms of dyspnea: Secondary | ICD-10-CM | POA: Diagnosis not present

## 2022-03-27 DIAGNOSIS — R109 Unspecified abdominal pain: Secondary | ICD-10-CM | POA: Diagnosis not present

## 2022-03-27 DIAGNOSIS — R278 Other lack of coordination: Secondary | ICD-10-CM | POA: Diagnosis not present

## 2022-03-27 DIAGNOSIS — J449 Chronic obstructive pulmonary disease, unspecified: Secondary | ICD-10-CM | POA: Diagnosis not present

## 2022-03-27 DIAGNOSIS — Z87898 Personal history of other specified conditions: Secondary | ICD-10-CM | POA: Diagnosis not present

## 2022-03-27 DIAGNOSIS — R846 Abnormal cytological findings in specimens from respiratory organs and thorax: Secondary | ICD-10-CM | POA: Diagnosis not present

## 2022-03-27 DIAGNOSIS — R41841 Cognitive communication deficit: Secondary | ICD-10-CM | POA: Diagnosis not present

## 2022-03-27 DIAGNOSIS — I429 Cardiomyopathy, unspecified: Secondary | ICD-10-CM | POA: Diagnosis not present

## 2022-03-27 DIAGNOSIS — I472 Ventricular tachycardia, unspecified: Secondary | ICD-10-CM | POA: Diagnosis not present

## 2022-03-27 DIAGNOSIS — J90 Pleural effusion, not elsewhere classified: Secondary | ICD-10-CM | POA: Diagnosis not present

## 2022-03-27 DIAGNOSIS — E440 Moderate protein-calorie malnutrition: Secondary | ICD-10-CM | POA: Diagnosis not present

## 2022-03-27 DIAGNOSIS — Z743 Need for continuous supervision: Secondary | ICD-10-CM | POA: Diagnosis not present

## 2022-03-27 DIAGNOSIS — I4891 Unspecified atrial fibrillation: Secondary | ICD-10-CM | POA: Diagnosis not present

## 2022-03-27 DIAGNOSIS — I340 Nonrheumatic mitral (valve) insufficiency: Secondary | ICD-10-CM | POA: Diagnosis present

## 2022-03-27 DIAGNOSIS — R5381 Other malaise: Secondary | ICD-10-CM | POA: Diagnosis not present

## 2022-03-27 DIAGNOSIS — I480 Paroxysmal atrial fibrillation: Secondary | ICD-10-CM

## 2022-03-27 DIAGNOSIS — M6281 Muscle weakness (generalized): Secondary | ICD-10-CM | POA: Diagnosis not present

## 2022-03-27 DIAGNOSIS — D61810 Antineoplastic chemotherapy induced pancytopenia: Secondary | ICD-10-CM | POA: Diagnosis not present

## 2022-03-27 DIAGNOSIS — M7989 Other specified soft tissue disorders: Secondary | ICD-10-CM | POA: Diagnosis not present

## 2022-03-27 DIAGNOSIS — R918 Other nonspecific abnormal finding of lung field: Secondary | ICD-10-CM | POA: Diagnosis present

## 2022-03-27 DIAGNOSIS — R079 Chest pain, unspecified: Secondary | ICD-10-CM | POA: Diagnosis not present

## 2022-03-27 DIAGNOSIS — M47816 Spondylosis without myelopathy or radiculopathy, lumbar region: Secondary | ICD-10-CM | POA: Diagnosis not present

## 2022-03-27 DIAGNOSIS — E876 Hypokalemia: Secondary | ICD-10-CM | POA: Diagnosis not present

## 2022-03-27 DIAGNOSIS — R531 Weakness: Secondary | ICD-10-CM | POA: Diagnosis not present

## 2022-03-27 DIAGNOSIS — J440 Chronic obstructive pulmonary disease with acute lower respiratory infection: Secondary | ICD-10-CM | POA: Diagnosis present

## 2022-03-27 DIAGNOSIS — I4819 Other persistent atrial fibrillation: Secondary | ICD-10-CM | POA: Diagnosis not present

## 2022-03-27 DIAGNOSIS — Y848 Other medical procedures as the cause of abnormal reaction of the patient, or of later complication, without mention of misadventure at the time of the procedure: Secondary | ICD-10-CM | POA: Diagnosis present

## 2022-03-27 DIAGNOSIS — K5900 Constipation, unspecified: Secondary | ICD-10-CM | POA: Diagnosis present

## 2022-03-27 DIAGNOSIS — M069 Rheumatoid arthritis, unspecified: Secondary | ICD-10-CM | POA: Diagnosis present

## 2022-03-27 DIAGNOSIS — I130 Hypertensive heart and chronic kidney disease with heart failure and stage 1 through stage 4 chronic kidney disease, or unspecified chronic kidney disease: Secondary | ICD-10-CM | POA: Diagnosis not present

## 2022-03-27 DIAGNOSIS — J9600 Acute respiratory failure, unspecified whether with hypoxia or hypercapnia: Secondary | ICD-10-CM | POA: Diagnosis not present

## 2022-03-27 DIAGNOSIS — N1831 Chronic kidney disease, stage 3a: Secondary | ICD-10-CM | POA: Diagnosis not present

## 2022-03-27 DIAGNOSIS — K5903 Drug induced constipation: Secondary | ICD-10-CM | POA: Diagnosis not present

## 2022-03-27 LAB — CBC WITH DIFFERENTIAL/PLATELET
Abs Immature Granulocytes: 0 10*3/uL (ref 0.00–0.07)
Basophils Absolute: 0 10*3/uL (ref 0.0–0.1)
Basophils Relative: 1 %
Eosinophils Absolute: 0 10*3/uL (ref 0.0–0.5)
Eosinophils Relative: 1 %
HCT: 26.9 % — ABNORMAL LOW (ref 36.0–46.0)
Hemoglobin: 8.4 g/dL — ABNORMAL LOW (ref 12.0–15.0)
Immature Granulocytes: 0 %
Lymphocytes Relative: 56 %
Lymphs Abs: 0.5 10*3/uL — ABNORMAL LOW (ref 0.7–4.0)
MCH: 33.3 pg (ref 26.0–34.0)
MCHC: 31.2 g/dL (ref 30.0–36.0)
MCV: 106.7 fL — ABNORMAL HIGH (ref 80.0–100.0)
Monocytes Absolute: 0.1 10*3/uL (ref 0.1–1.0)
Monocytes Relative: 7 %
Neutro Abs: 0.3 10*3/uL — CL (ref 1.7–7.7)
Neutrophils Relative %: 35 %
Platelets: 41 10*3/uL — ABNORMAL LOW (ref 150–400)
RBC: 2.52 MIL/uL — ABNORMAL LOW (ref 3.87–5.11)
RDW: 16.5 % — ABNORMAL HIGH (ref 11.5–15.5)
WBC: 0.9 10*3/uL — CL (ref 4.0–10.5)
nRBC: 0 % (ref 0.0–0.2)

## 2022-03-27 LAB — SAMPLE TO BLOOD BANK

## 2022-03-27 MED ORDER — SODIUM CHLORIDE 0.9% FLUSH
10.0000 mL | Freq: Once | INTRAVENOUS | Status: AC
  Administered 2022-03-27: 10 mL

## 2022-03-27 MED ORDER — HEPARIN SOD (PORK) LOCK FLUSH 100 UNIT/ML IV SOLN
500.0000 [IU] | INTRAVENOUS | Status: AC | PRN
  Administered 2022-03-27: 500 [IU]

## 2022-03-27 MED ORDER — METOPROLOL SUCCINATE ER 50 MG PO TB24: 75.0000 mg | ORAL_TABLET | Freq: Every morning | ORAL | 1 refills | Status: DC

## 2022-03-27 MED ORDER — METOPROLOL TARTRATE 25 MG PO TABS: ORAL_TABLET | ORAL | 2 refills | Status: DC

## 2022-03-27 NOTE — Telephone Encounter (Signed)
.  Called patient to schedule appointment per 3/29 inbasket, patient is aware of date and time.   ?

## 2022-03-27 NOTE — Telephone Encounter (Addendum)
Patient scheduled for Tuesday April 11th at 10am at the [facility].  ? ? ?----- Message from Loel Dubonnet, NP sent at 03/27/2022  1:47 PM EDT ----- ?Discussed with her oncology team that her heart rate was up during their treatment today. Dr. Burr Medico reviewed to increase Toprol to 79m daily and start PRN Metoprolol Tartrate 284mfor heart rate >100bpm. ? ?At pulmonology request I have also ordered echocardiogram to evaluate her newly worsened dyspnea. Please let her know and assist to schedule. ?

## 2022-03-27 NOTE — Progress Notes (Signed)
Received note from Dr. Burr Medico that patient was in atrial fib 120-130bpm at clinic visit. Discussed plan to increase Toprol to 75 mg daily and add Metoprolol Tartrate 4m PRN for heart rate >100bpm. Rx sent to pharmacy. Dr. FBurr Medicokindly reviewing instructions with patient at her visit.  ? ?Pulmonology request for echo due to dyspnea - will order.  ? ?CLoel Dubonnet NP  ?

## 2022-03-27 NOTE — Telephone Encounter (Signed)
CRITICAL VALUE STICKER ? ?CRITICAL VALUE: WBC = 0.9 ? ?RECEIVER (on-site recipient of call): Yetta Glassman, CMA ? ?DATE & TIME NOTIFIED: 03/27/22 at 12:06pm ? ?MESSENGER (representative from lab): Rosann Auerbach ? ?MD NOTIFIED: Burr Medico ? ?TIME OF NOTIFICATION: 03/27/22 at 12:08pm ? ?RESPONSE: Notification provided to Dr. Grant Ruts and Tammi Sou, RN for follow-up with pt. ? ?

## 2022-03-28 ENCOUNTER — Telehealth (HOSPITAL_BASED_OUTPATIENT_CLINIC_OR_DEPARTMENT_OTHER): Payer: Self-pay

## 2022-03-28 ENCOUNTER — Encounter: Payer: Self-pay | Admitting: Nurse Practitioner

## 2022-03-28 NOTE — Telephone Encounter (Addendum)
RN returned call to patient and informed her of the updates.  ? ? ? ?----- Message from Loel Dubonnet, NP sent at 03/28/2022 11:25 AM EDT ----- ?Thank you Dr. Acie Fredrickson! ? ?[name] - will you please call [name] to make her aware to stop her Eliquis? Dr. Cathie Olden and I have discussed this recommendation.She should keep the prescription on hand in case we are able to resume in the future. This is to help prevent bleeding complications.  ?----- Message ----- ?From: Thayer Headings, MD ?Sent: 03/27/2022   5:51 PM EDT ?To: Truitt Merle, MD, Loel Dubonnet, NP ? ?Thanks [place], ? ?It looks like her pancytopenia has acutely worsened over the past several weeks . ?AT this point, I think we should hold her Eliquis completely .   Agree with Zio monitor to assess atrial fib burden.    ?Perhaps she can be started on something from a hematology standpoint to improve her pancytopenia and reduce the risk of restarting her eliquis .  Platelet count is very low ( 26K)  ? ? ?Thanks ? ?Phil ? ? ? ?----- Message ----- ?From: Loel Dubonnet, NP ?Sent: 03/24/2022   9:20 PM EDT ?To: Thayer Headings, MD, Truitt Merle, MD ? ?Hi Dr. Acie Fredrickson and Dr. Burr Medico, ?I saw [name] today for follow up due to atrial fib during recent infusion, shortness of breath. Dr. Acie Fredrickson - since last seen she has been diagnosed with CMML with associated pancytopenia. She is receiving PRBC for Hb <8. At last oncology visit her Eliquis was reduced to 59m QD. Unfortunately daily dosing does not provide adequate protection form CVA but given her pancytopenia the risks may outweigh benefit at this point? I wanted to get both of your input. I did place a 3 day monitor to assess burden, rate. I reduced Losartan due to hypotension. Input or guidance much appreciated. ?Best,  ?CLoel Dubonnet NP  ? ? ?

## 2022-03-29 ENCOUNTER — Inpatient Hospital Stay (HOSPITAL_COMMUNITY)
Admission: EM | Admit: 2022-03-29 | Discharge: 2022-04-13 | DRG: 308 | Disposition: A | Discharge status: Skilled Nursing Facility | Payer: Medicare Other | Attending: Internal Medicine | Admitting: Internal Medicine

## 2022-03-29 ENCOUNTER — Encounter: Payer: Self-pay | Admitting: Nurse Practitioner

## 2022-03-29 ENCOUNTER — Other Ambulatory Visit: Payer: Self-pay

## 2022-03-29 ENCOUNTER — Telehealth: Payer: Self-pay

## 2022-03-29 ENCOUNTER — Encounter: Payer: Self-pay | Admitting: Cardiovascular Disease

## 2022-03-29 ENCOUNTER — Emergency Department (HOSPITAL_COMMUNITY): Payer: Medicare Other

## 2022-03-29 ENCOUNTER — Encounter (HOSPITAL_BASED_OUTPATIENT_CLINIC_OR_DEPARTMENT_OTHER): Payer: Self-pay

## 2022-03-29 ENCOUNTER — Other Ambulatory Visit (HOSPITAL_COMMUNITY): Payer: Self-pay

## 2022-03-29 ENCOUNTER — Encounter (HOSPITAL_COMMUNITY): Payer: Self-pay

## 2022-03-29 DIAGNOSIS — R109 Unspecified abdominal pain: Secondary | ICD-10-CM | POA: Diagnosis not present

## 2022-03-29 DIAGNOSIS — G8929 Other chronic pain: Secondary | ICD-10-CM | POA: Diagnosis present

## 2022-03-29 DIAGNOSIS — J9600 Acute respiratory failure, unspecified whether with hypoxia or hypercapnia: Secondary | ICD-10-CM | POA: Diagnosis not present

## 2022-03-29 DIAGNOSIS — Z833 Family history of diabetes mellitus: Secondary | ICD-10-CM

## 2022-03-29 DIAGNOSIS — M549 Dorsalgia, unspecified: Secondary | ICD-10-CM | POA: Diagnosis present

## 2022-03-29 DIAGNOSIS — M069 Rheumatoid arthritis, unspecified: Secondary | ICD-10-CM | POA: Diagnosis present

## 2022-03-29 DIAGNOSIS — I429 Cardiomyopathy, unspecified: Secondary | ICD-10-CM | POA: Diagnosis present

## 2022-03-29 DIAGNOSIS — R0600 Dyspnea, unspecified: Secondary | ICD-10-CM | POA: Diagnosis not present

## 2022-03-29 DIAGNOSIS — Z8249 Family history of ischemic heart disease and other diseases of the circulatory system: Secondary | ICD-10-CM

## 2022-03-29 DIAGNOSIS — I5022 Chronic systolic (congestive) heart failure: Secondary | ICD-10-CM

## 2022-03-29 DIAGNOSIS — Z79899 Other long term (current) drug therapy: Secondary | ICD-10-CM

## 2022-03-29 DIAGNOSIS — R918 Other nonspecific abnormal finding of lung field: Secondary | ICD-10-CM | POA: Diagnosis present

## 2022-03-29 DIAGNOSIS — E876 Hypokalemia: Secondary | ICD-10-CM

## 2022-03-29 DIAGNOSIS — J189 Pneumonia, unspecified organism: Secondary | ICD-10-CM | POA: Diagnosis present

## 2022-03-29 DIAGNOSIS — Z95828 Presence of other vascular implants and grafts: Secondary | ICD-10-CM

## 2022-03-29 DIAGNOSIS — J810 Acute pulmonary edema: Secondary | ICD-10-CM

## 2022-03-29 DIAGNOSIS — Y92230 Patient room in hospital as the place of occurrence of the external cause: Secondary | ICD-10-CM | POA: Diagnosis present

## 2022-03-29 DIAGNOSIS — I130 Hypertensive heart and chronic kidney disease with heart failure and stage 1 through stage 4 chronic kidney disease, or unspecified chronic kidney disease: Secondary | ICD-10-CM | POA: Diagnosis present

## 2022-03-29 DIAGNOSIS — I340 Nonrheumatic mitral (valve) insufficiency: Secondary | ICD-10-CM | POA: Diagnosis present

## 2022-03-29 DIAGNOSIS — J440 Chronic obstructive pulmonary disease with acute lower respiratory infection: Secondary | ICD-10-CM | POA: Diagnosis present

## 2022-03-29 DIAGNOSIS — I5033 Acute on chronic diastolic (congestive) heart failure: Secondary | ICD-10-CM

## 2022-03-29 DIAGNOSIS — C931 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: Secondary | ICD-10-CM | POA: Diagnosis present

## 2022-03-29 DIAGNOSIS — R079 Chest pain, unspecified: Secondary | ICD-10-CM | POA: Diagnosis not present

## 2022-03-29 DIAGNOSIS — R7989 Other specified abnormal findings of blood chemistry: Secondary | ICD-10-CM | POA: Diagnosis present

## 2022-03-29 DIAGNOSIS — R5381 Other malaise: Secondary | ICD-10-CM | POA: Diagnosis not present

## 2022-03-29 DIAGNOSIS — J449 Chronic obstructive pulmonary disease, unspecified: Secondary | ICD-10-CM | POA: Diagnosis not present

## 2022-03-29 DIAGNOSIS — N1831 Chronic kidney disease, stage 3a: Secondary | ICD-10-CM | POA: Diagnosis present

## 2022-03-29 DIAGNOSIS — I959 Hypotension, unspecified: Secondary | ICD-10-CM | POA: Diagnosis present

## 2022-03-29 DIAGNOSIS — E440 Moderate protein-calorie malnutrition: Secondary | ICD-10-CM | POA: Diagnosis not present

## 2022-03-29 DIAGNOSIS — R2689 Other abnormalities of gait and mobility: Secondary | ICD-10-CM | POA: Diagnosis not present

## 2022-03-29 DIAGNOSIS — Z7952 Long term (current) use of systemic steroids: Secondary | ICD-10-CM

## 2022-03-29 DIAGNOSIS — T451X5A Adverse effect of antineoplastic and immunosuppressive drugs, initial encounter: Secondary | ICD-10-CM | POA: Diagnosis present

## 2022-03-29 DIAGNOSIS — D61810 Antineoplastic chemotherapy induced pancytopenia: Secondary | ICD-10-CM | POA: Diagnosis not present

## 2022-03-29 DIAGNOSIS — Z87898 Personal history of other specified conditions: Secondary | ICD-10-CM | POA: Diagnosis not present

## 2022-03-29 DIAGNOSIS — R0602 Shortness of breath: Secondary | ICD-10-CM | POA: Diagnosis present

## 2022-03-29 DIAGNOSIS — R41841 Cognitive communication deficit: Secondary | ICD-10-CM | POA: Diagnosis not present

## 2022-03-29 DIAGNOSIS — R531 Weakness: Secondary | ICD-10-CM | POA: Diagnosis not present

## 2022-03-29 DIAGNOSIS — I472 Ventricular tachycardia, unspecified: Secondary | ICD-10-CM | POA: Diagnosis not present

## 2022-03-29 DIAGNOSIS — S9032XA Contusion of left foot, initial encounter: Secondary | ICD-10-CM | POA: Diagnosis present

## 2022-03-29 DIAGNOSIS — K5903 Drug induced constipation: Secondary | ICD-10-CM | POA: Diagnosis not present

## 2022-03-29 DIAGNOSIS — I4819 Other persistent atrial fibrillation: Secondary | ICD-10-CM | POA: Diagnosis not present

## 2022-03-29 DIAGNOSIS — Y848 Other medical procedures as the cause of abnormal reaction of the patient, or of later complication, without mention of misadventure at the time of the procedure: Secondary | ICD-10-CM | POA: Diagnosis present

## 2022-03-29 DIAGNOSIS — Z888 Allergy status to other drugs, medicaments and biological substances status: Secondary | ICD-10-CM

## 2022-03-29 DIAGNOSIS — R609 Edema, unspecified: Secondary | ICD-10-CM | POA: Diagnosis not present

## 2022-03-29 DIAGNOSIS — Z79620 Long term (current) use of immunosuppressive biologic: Secondary | ICD-10-CM

## 2022-03-29 DIAGNOSIS — Z6821 Body mass index (BMI) 21.0-21.9, adult: Secondary | ICD-10-CM

## 2022-03-29 DIAGNOSIS — R846 Abnormal cytological findings in specimens from respiratory organs and thorax: Secondary | ICD-10-CM | POA: Diagnosis not present

## 2022-03-29 DIAGNOSIS — Z87891 Personal history of nicotine dependence: Secondary | ICD-10-CM

## 2022-03-29 DIAGNOSIS — Z91018 Allergy to other foods: Secondary | ICD-10-CM

## 2022-03-29 DIAGNOSIS — D61818 Other pancytopenia: Secondary | ICD-10-CM

## 2022-03-29 DIAGNOSIS — I480 Paroxysmal atrial fibrillation: Secondary | ICD-10-CM | POA: Diagnosis not present

## 2022-03-29 DIAGNOSIS — E871 Hypo-osmolality and hyponatremia: Secondary | ICD-10-CM | POA: Diagnosis not present

## 2022-03-29 DIAGNOSIS — M47816 Spondylosis without myelopathy or radiculopathy, lumbar region: Secondary | ICD-10-CM | POA: Diagnosis not present

## 2022-03-29 DIAGNOSIS — J90 Pleural effusion, not elsewhere classified: Secondary | ICD-10-CM

## 2022-03-29 DIAGNOSIS — Z743 Need for continuous supervision: Secondary | ICD-10-CM | POA: Diagnosis not present

## 2022-03-29 DIAGNOSIS — Z20822 Contact with and (suspected) exposure to covid-19: Secondary | ICD-10-CM | POA: Diagnosis not present

## 2022-03-29 DIAGNOSIS — J441 Chronic obstructive pulmonary disease with (acute) exacerbation: Secondary | ICD-10-CM | POA: Diagnosis present

## 2022-03-29 DIAGNOSIS — R0609 Other forms of dyspnea: Secondary | ICD-10-CM | POA: Insufficient documentation

## 2022-03-29 DIAGNOSIS — I5023 Acute on chronic systolic (congestive) heart failure: Secondary | ICD-10-CM

## 2022-03-29 DIAGNOSIS — K5900 Constipation, unspecified: Secondary | ICD-10-CM

## 2022-03-29 DIAGNOSIS — J188 Other pneumonia, unspecified organism: Secondary | ICD-10-CM

## 2022-03-29 DIAGNOSIS — M6281 Muscle weakness (generalized): Secondary | ICD-10-CM | POA: Diagnosis not present

## 2022-03-29 DIAGNOSIS — Z880 Allergy status to penicillin: Secondary | ICD-10-CM

## 2022-03-29 DIAGNOSIS — I4891 Unspecified atrial fibrillation: Secondary | ICD-10-CM | POA: Insufficient documentation

## 2022-03-29 DIAGNOSIS — Z96611 Presence of right artificial shoulder joint: Secondary | ICD-10-CM | POA: Diagnosis present

## 2022-03-29 DIAGNOSIS — M7989 Other specified soft tissue disorders: Secondary | ICD-10-CM | POA: Diagnosis not present

## 2022-03-29 DIAGNOSIS — R278 Other lack of coordination: Secondary | ICD-10-CM | POA: Diagnosis not present

## 2022-03-29 LAB — CBC WITH DIFFERENTIAL/PLATELET
Abs Immature Granulocytes: 0 10*3/uL (ref 0.00–0.07)
Basophils Absolute: 0 10*3/uL (ref 0.0–0.1)
Basophils Relative: 3 %
Eosinophils Absolute: 0 10*3/uL (ref 0.0–0.5)
Eosinophils Relative: 1 %
HCT: 29.6 % — ABNORMAL LOW (ref 36.0–46.0)
Hemoglobin: 9.4 g/dL — ABNORMAL LOW (ref 12.0–15.0)
Lymphocytes Relative: 65 %
Lymphs Abs: 0.6 10*3/uL — ABNORMAL LOW (ref 0.7–4.0)
MCH: 33.8 pg (ref 26.0–34.0)
MCHC: 31.8 g/dL (ref 30.0–36.0)
MCV: 106.5 fL — ABNORMAL HIGH (ref 80.0–100.0)
Monocytes Absolute: 0.1 10*3/uL (ref 0.1–1.0)
Monocytes Relative: 8 %
Neutro Abs: 0.2 10*3/uL — CL (ref 1.7–7.7)
Neutrophils Relative %: 23 %
Platelets: 49 10*3/uL — ABNORMAL LOW (ref 150–400)
RBC: 2.78 MIL/uL — ABNORMAL LOW (ref 3.87–5.11)
RDW: 15.9 % — ABNORMAL HIGH (ref 11.5–15.5)
WBC: 0.9 10*3/uL — CL (ref 4.0–10.5)
nRBC: 0 % (ref 0.0–0.2)

## 2022-03-29 LAB — TROPONIN I (HIGH SENSITIVITY)
Troponin I (High Sensitivity): 20 ng/L — ABNORMAL HIGH (ref <18)
Troponin I (High Sensitivity): 18 ng/L — ABNORMAL HIGH (ref <18)

## 2022-03-29 LAB — COMPREHENSIVE METABOLIC PANEL
ALT: 13 U/L (ref 0–44)
AST: 16 U/L (ref 15–41)
Albumin: 3.5 g/dL (ref 3.5–5.0)
Alkaline Phosphatase: 37 U/L — ABNORMAL LOW (ref 38–126)
Anion gap: 8 (ref 5–15)
BUN: 27 mg/dL — ABNORMAL HIGH (ref 8–23)
CO2: 23 mmol/L (ref 22–32)
Calcium: 9.2 mg/dL (ref 8.9–10.3)
Chloride: 107 mmol/L (ref 98–111)
Creatinine, Ser: 0.93 mg/dL (ref 0.44–1.00)
GFR, Estimated: >60 mL/min (ref >60)
Glucose, Bld: 126 mg/dL — ABNORMAL HIGH (ref 70–99)
Potassium: 4 mmol/L (ref 3.5–5.1)
Sodium: 138 mmol/L (ref 135–145)
Total Bilirubin: 1.1 mg/dL (ref 0.3–1.2)
Total Protein: 6.4 g/dL — ABNORMAL LOW (ref 6.5–8.1)

## 2022-03-29 LAB — HEPARIN LEVEL (UNFRACTIONATED): Heparin Unfractionated: 0.27 IU/mL — ABNORMAL LOW (ref 0.30–0.70)

## 2022-03-29 LAB — BRAIN NATRIURETIC PEPTIDE: B Natriuretic Peptide: 474.3 pg/mL — ABNORMAL HIGH (ref 0.0–100.0)

## 2022-03-29 LAB — RESP PANEL BY RT-PCR (FLU A&B, COVID) ARPGX2
Influenza A by PCR: NEGATIVE
Influenza B by PCR: NEGATIVE
SARS Coronavirus 2 by RT PCR: NEGATIVE

## 2022-03-29 LAB — MRSA NEXT GEN BY PCR, NASAL: MRSA by PCR Next Gen: NOT DETECTED

## 2022-03-29 LAB — D-DIMER, QUANTITATIVE: D-Dimer, Quant: 2.36 ug/mL-FEU — ABNORMAL HIGH (ref 0.00–0.50)

## 2022-03-29 LAB — GLUCOSE, CAPILLARY: Glucose-Capillary: 151 mg/dL — ABNORMAL HIGH (ref 70–99)

## 2022-03-29 LAB — PROTIME-INR
INR: 1.2 (ref 0.8–1.2)
Prothrombin Time: 15.4 seconds — ABNORMAL HIGH (ref 11.4–15.2)

## 2022-03-29 LAB — APTT: aPTT: 29 seconds (ref 24–36)

## 2022-03-29 LAB — T4, FREE: Free T4: 1.47 ng/dL — ABNORMAL HIGH (ref 0.61–1.12)

## 2022-03-29 LAB — TSH: TSH: 0.731 u[IU]/mL (ref 0.350–4.500)

## 2022-03-29 MED ORDER — METOPROLOL TARTRATE 5 MG/5ML IV SOLN
5.0000 mg | Freq: Once | INTRAVENOUS | Status: AC
  Administered 2022-03-29: 5 mg via INTRAVENOUS
  Filled 2022-03-29: qty 5

## 2022-03-29 MED ORDER — BUDESONIDE 0.5 MG/2ML IN SUSP
2.0000 mg | Freq: Two times a day (BID) | RESPIRATORY_TRACT | Status: DC
  Administered 2022-03-29 – 2022-04-05 (×15): 2 mg via RESPIRATORY_TRACT
  Filled 2022-03-29 (×16): qty 8

## 2022-03-29 MED ORDER — SODIUM CHLORIDE 0.9 % IV SOLN
500.0000 mg | INTRAVENOUS | Status: AC
  Administered 2022-03-29 – 2022-04-02 (×5): 500 mg via INTRAVENOUS
  Filled 2022-03-29 (×5): qty 5

## 2022-03-29 MED ORDER — ONDANSETRON HCL 4 MG/2ML IJ SOLN: 4.0000 mg | Freq: Four times a day (QID) | INTRAMUSCULAR | Status: DC | PRN

## 2022-03-29 MED ORDER — ALBUTEROL SULFATE (2.5 MG/3ML) 0.083% IN NEBU
2.5000 mg | INHALATION_SOLUTION | RESPIRATORY_TRACT | Status: DC | PRN
  Administered 2022-03-31 – 2022-04-02 (×2): 2.5 mg via RESPIRATORY_TRACT
  Filled 2022-03-29 (×3): qty 3

## 2022-03-29 MED ORDER — METHYLPREDNISOLONE SODIUM SUCC 125 MG IJ SOLR
60.0000 mg | Freq: Two times a day (BID) | INTRAMUSCULAR | Status: DC
  Administered 2022-03-29 – 2022-03-31 (×4): 60 mg via INTRAVENOUS
  Filled 2022-03-29 (×4): qty 2

## 2022-03-29 MED ORDER — ONDANSETRON HCL 4 MG PO TABS: 4.0000 mg | ORAL_TABLET | Freq: Four times a day (QID) | ORAL | Status: DC | PRN

## 2022-03-29 MED ORDER — SODIUM CHLORIDE 0.9 % IV SOLN: 2.0000 g | Freq: Two times a day (BID) | INTRAVENOUS | Status: DC

## 2022-03-29 MED ORDER — SODIUM CHLORIDE 0.9 % IV SOLN
2.0000 g | Freq: Three times a day (TID) | INTRAVENOUS | Status: DC
  Administered 2022-03-30: 2 g via INTRAVENOUS
  Filled 2022-03-29: qty 12.5

## 2022-03-29 MED ORDER — HEPARIN BOLUS VIA INFUSION
2000.0000 [IU] | Freq: Once | INTRAVENOUS | Status: AC
  Administered 2022-03-29: 2000 [IU] via INTRAVENOUS
  Filled 2022-03-29: qty 2000

## 2022-03-29 MED ORDER — CHLORHEXIDINE GLUCONATE CLOTH 2 % EX PADS
6.0000 | MEDICATED_PAD | Freq: Every day | CUTANEOUS | Status: DC
  Administered 2022-03-30 – 2022-04-13 (×13): 6 via TOPICAL

## 2022-03-29 MED ORDER — SODIUM CHLORIDE 0.9 % IV SOLN
2.0000 g | Freq: Once | INTRAVENOUS | Status: AC
  Administered 2022-03-29: 2 g via INTRAVENOUS
  Filled 2022-03-29: qty 12.5

## 2022-03-29 MED ORDER — ARFORMOTEROL TARTRATE 15 MCG/2ML IN NEBU
15.0000 ug | INHALATION_SOLUTION | Freq: Two times a day (BID) | RESPIRATORY_TRACT | Status: DC
Start: 2022-03-29 — End: 2022-04-13
  Administered 2022-03-29 – 2022-04-13 (×30): 15 ug via RESPIRATORY_TRACT
  Filled 2022-03-29 (×29): qty 2

## 2022-03-29 MED ORDER — LOSARTAN POTASSIUM 25 MG PO TABS
25.0000 mg | ORAL_TABLET | Freq: Every day | ORAL | Status: DC
  Filled 2022-03-29: qty 1

## 2022-03-29 MED ORDER — HEPARIN (PORCINE) 25000 UT/250ML-% IV SOLN
900.0000 [IU]/h | INTRAVENOUS | Status: DC
  Administered 2022-03-29: 800 [IU]/h via INTRAVENOUS
  Administered 2022-03-30: 900 [IU]/h via INTRAVENOUS
  Filled 2022-03-29 (×2): qty 250

## 2022-03-29 MED ORDER — DILTIAZEM HCL-DEXTROSE 125-5 MG/125ML-% IV SOLN (PREMIX)
5.0000 mg/h | INTRAVENOUS | Status: DC
  Administered 2022-03-29: 5 mg/h via INTRAVENOUS
  Administered 2022-03-30: 15 mg/h via INTRAVENOUS
  Administered 2022-03-30: 10 mg/h via INTRAVENOUS
  Administered 2022-03-30: 15 mg/h via INTRAVENOUS
  Filled 2022-03-29 (×4): qty 125

## 2022-03-29 MED ORDER — IOHEXOL 350 MG/ML SOLN
75.0000 mL | Freq: Once | INTRAVENOUS | Status: AC | PRN
  Administered 2022-03-29: 75 mL via INTRAVENOUS

## 2022-03-29 MED ORDER — ORAL CARE MOUTH RINSE
15.0000 mL | Freq: Two times a day (BID) | OROMUCOSAL | Status: DC
  Administered 2022-03-29: 15 mL via OROMUCOSAL

## 2022-03-29 MED ORDER — ACETAMINOPHEN 650 MG RE SUPP
650.0000 mg | Freq: Four times a day (QID) | RECTAL | Status: DC | PRN
Start: 2022-03-29 — End: 2022-04-13

## 2022-03-29 MED ORDER — PREDNISONE 5 MG PO TABS
5.0000 mg | ORAL_TABLET | Freq: Every day | ORAL | Status: DC
Start: 2022-03-30 — End: 2022-03-29

## 2022-03-29 MED ORDER — ACETAMINOPHEN 325 MG PO TABS
650.0000 mg | ORAL_TABLET | Freq: Four times a day (QID) | ORAL | Status: DC | PRN
  Administered 2022-03-30 – 2022-04-10 (×8): 650 mg via ORAL
  Filled 2022-03-29 (×8): qty 2

## 2022-03-29 MED ORDER — DILTIAZEM HCL 25 MG/5ML IV SOLN
20.0000 mg | Freq: Once | INTRAVENOUS | Status: AC
  Administered 2022-03-29: 20 mg via INTRAVENOUS
  Filled 2022-03-29: qty 5

## 2022-03-29 MED ORDER — UMECLIDINIUM BROMIDE 62.5 MCG/ACT IN AEPB
1.0000 | INHALATION_SPRAY | Freq: Every day | RESPIRATORY_TRACT | Status: DC
Start: 2022-03-29 — End: 2022-04-13
  Administered 2022-03-30 – 2022-04-13 (×12): 1 via RESPIRATORY_TRACT
  Filled 2022-03-29 (×3): qty 7

## 2022-03-29 MED ORDER — METOPROLOL SUCCINATE ER 50 MG PO TB24
75.0000 mg | ORAL_TABLET | Freq: Every morning | ORAL | Status: DC
  Filled 2022-03-29: qty 1.5

## 2022-03-29 NOTE — Progress Notes (Signed)
Pharmacy Antibiotic Note ? ?[name] is a 76 y.o. female admitted on 03/29/2022 with febrile neutropenia.  Pharmacy has been consulted for Cefepime dosing. ? ?Plan: ?Cefepime 2g IV q8h ?Follow up renal function & cultures ? ?Height: 5' 4"  (162.6 cm) ?Weight: 57.6 kg (127 lb) ?IBW/kg (Calculated) : 54.7 ? ?Temp (24hrs), Avg:98.9 ?F (37.2 ?C), Min:98.9 ?F (37.2 ?C), Max:98.9 ?F (37.2 ?C) ? ?Recent Labs  ?Lab 03/23/22 ?1511 03/27/22 ?1153 03/29/22 ?1403  ?WBC 1.5* 0.9* 0.9*  ?CREATININE  --   --  0.93  ?  ?Estimated Creatinine Clearance: 45.1 mL/min (by C-G formula based on SCr of 0.93 mg/dL).   ? ?Allergies  ?Allergen Reactions  ? Gluten Meal Diarrhea  ? Penicillins Rash and Other (See Comments)  ?  'Many years ago' ?  ? Alendronate Rash  ? Hydroxychloroquine Rash  ? ? ?Antimicrobials this admission: ?4/5 Cefepime >> ? ?Dose adjustments this admission: ? ?Microbiology results: ?none ? ?Thank you for allowing pharmacy to be a part of this patient?s care. ? ?Peggyann Juba, PharmD, BCPS ?Pharmacy: [phone] ?03/29/2022 5:56 PM ? ?

## 2022-03-29 NOTE — ED Provider Notes (Addendum)
?[facility] DEPT ?Provider Note ? ? ?CSN: [id number] ?Arrival date & time: 03/29/22  1343 ? ?  ? ?History ? ?Chief Complaint  ?Patient presents with  ? Shortness of Breath  ? ? ?[name] is a 76 y.o. female. ? ? ?Shortness of Breath ? ? 76 year old female with a hx of COPD, HTN, CMML, currently on chemotherapy, pancytopenia, rheumatoid arthritis on Remicade, paroxysmal A-fib (previously on Eliquis, stopped 2d ago due to bleeding complications previously) presenting to the ED with shortness of breath. The patient endorses chronic shortness of breath that has acutely worsened over the past few days. She states that she has checked her HR at home and it has been normal. She endorses new bilateral lower extremity edema. She denies chest pain, cough, fevers or chills. She endorses new dyspnea on exertion. ? ?Home Medications ?Prior to Admission medications   ?Medication Sig Start Date End Date Taking? Authorizing Provider  ?acetaminophen (TYLENOL) 500 MG tablet Take 1,000 mg by mouth every 6 (six) hours as needed for mild pain.    [provider]  ?Budeson-Glycopyrrol-Formoterol (BREZTRI AEROSPHERE) 160-9-4.8 MCG/ACT AERO Inhale 2 puffs into the lungs in the morning and at bedtime. 03/10/22   Garner Nash, DO  ?Calcium-Magnesium-Vitamin D (CALCIUM 1200+D3 PO) Take 1 tablet by mouth daily.    [provider]  ?denosumab (PROLIA) 60 MG/ML SOSY injection Inject 60 mg into the skin every 6 (six) months.    [provider]  ?gabapentin (NEURONTIN) 300 MG capsule Take 300 mg by mouth 3 (three) times daily.  07/21/14   [provider]  ?InFLIXimab (REMICADE IV) Inject 10 mg into the vein every 8 (eight) weeks.    [provider]  ?lidocaine-prilocaine (EMLA) cream Apply to affected area once 01/15/22   Truitt Merle, MD  ?losartan (COZAAR) 25 MG tablet Take 1 tablet (25 mg total) by mouth daily. 03/24/22 09/20/22  Loel Dubonnet, NP  ?metoprolol  succinate (TOPROL-XL) 50 MG 24 hr tablet Take 1.5 tablets (75 mg total) by mouth every morning. 03/27/22   Loel Dubonnet, NP  ?metoprolol tartrate (LOPRESSOR) 25 MG tablet Take one tablet as needed for heart rate greater than 100bpm. If heart rate still greater than 100bpm after 30 minutes, take a second tablet. 03/27/22   Loel Dubonnet, NP  ?predniSONE (DELTASONE) 5 MG tablet Take 5 mg by mouth daily. TAKE 1 TABLET ORALLY ONCE A DAY FOR 30 DAYS 12/12/21   Gavin Pound, MD  ?Tiotropium Bromide-Olodaterol (STIOLTO RESPIMAT) 2.5-2.5 MCG/ACT AERS Inhale 2 puffs into the lungs daily. 05/03/21   Martyn Ehrich, NP  ?vitamin C (ASCORBIC ACID) 500 MG tablet Take 500 mg by mouth daily.    [provider]  ?   ? ?Allergies    ?Gluten meal, Penicillins, Fosamax [alendronate], and Hydroxychloroquine   ? ?Review of Systems   ?Review of Systems  ?Respiratory:  Positive for shortness of breath.   ?Cardiovascular:  Positive for leg swelling.  ?All other systems reviewed and are negative. ? ?Physical Exam ?Updated Vital Signs ?BP 121/77   Pulse 73   Temp 98.9 ?F (37.2 ?C) (Oral)   Resp 19   Ht 5' 4"  (1.626 m)   Wt 57.6 kg   SpO2 97%   BMI 21.80 kg/m?  ?Physical Exam ?Vitals and nursing note reviewed.  ?Constitutional:   ?   General: She is not in acute distress. ?   Appearance: She is well-developed. She is ill-appearing.  ?  HENT:  ?   Head: Normocephalic and atraumatic.  ?Eyes:  ?   Conjunctiva/sclera: Conjunctivae normal.  ?Neck:  ?   Vascular: No JVD.  ?Cardiovascular:  ?   Rate and Rhythm: Tachycardia present. Rhythm irregular.  ?   Heart sounds: No murmur heard. ?Pulmonary:  ?   Effort: Pulmonary effort is normal. No tachypnea or respiratory distress.  ?   Breath sounds: Examination of the right-lower field reveals rales. Examination of the left-lower field reveals rales. Rales present.  ?Abdominal:  ?   Palpations: Abdomen is soft.  ?   Tenderness: There is no abdominal tenderness.  ?Musculoskeletal:      ?   General: No swelling.  ?   Cervical back: Neck supple.  ?   Right lower leg: Edema present.  ?   Left lower leg: Edema present.  ?   Comments: 3+ pitting edema in the bilateral lower extremities  ?Skin: ?   General: Skin is warm and dry.  ?   Capillary Refill: Capillary refill takes less than 2 seconds.  ?Neurological:  ?   Mental Status: She is alert.  ?Psychiatric:     ?   Mood and Affect: Mood normal.  ? ? ?ED Results / Procedures / Treatments   ?Labs ?(all labs ordered are listed, but only abnormal results are displayed) ?Labs Reviewed - No data to display ? ?EKG ?None ? ?Radiology ?No results found. ? ?Procedures ?Marland KitchenCritical Care ?Performed by: Regan Lemming, MD ?Authorized by: Regan Lemming, MD  ? ?Critical care provider statement:  ?  Critical care time (minutes):  30 ?  Critical care was necessary to treat or prevent imminent or life-threatening deterioration of the following conditions:  Circulatory failure ?  Critical care was time spent personally by me on the following activities:  Ordering and performing treatments and interventions, ordering and review of laboratory studies, ordering and review of radiographic studies, pulse oximetry, re-evaluation of patient's condition, review of old charts, development of treatment plan with patient or surrogate, evaluation of patient's response to treatment, examination of patient and obtaining history from patient or surrogate  ? ? ?Medications Ordered in ED ?Medications - No data to display ? ?ED Course/ Medical Decision Making/ A&P ?  ?                        ?Medical Decision Making ?Amount and/or Complexity of Data Reviewed ?Labs: ordered. ?Radiology: ordered. ? ?Risk ?Prescription drug management. ?Decision regarding hospitalization. ? ? ?76 year old female with a hx of COPD, HTN, CMML, currently on chemotherapy, pancytopenia, rheumatoid arthritis on Remicade, paroxysmal A-fib (previously on Eliquis, stopped 2d ago due to bleeding complications  previously) presenting to the ED with shortness of breath. The patient endorses chronic shortness of breath that has acutely worsened over the past few days. She states that she has checked her HR at home and it has been normal. She endorses new bilateral lower extremity edema. She denies chest pain, cough, fevers or chills. She endorses new dyspnea on exertion. ? ?On arrival the patient was tachycardic with rates close to 200, irregularly irregular on exam with atrial fibrillation with RVR on the cardiac monitor. HDS. Not hypoxic. Evidence of volume overload with bilateral LE edema and rales on exam. 29m of IV Diltiazem administered on arrival with good improvement in HR to the 130s, BP remained stable. Started pt on a Diltiazem gtt and heparin per pharmacy.  ? ?Pt without symptoms of ACS, PE,  PNA. Possible new CHF or PE given exam findings. CXR with evidence of pulmonary edema and bilateral pleural effusions. Will obtain CTA to further investigate. Initial workup to include screening labs concerning for BNP 474, dimer 2.36, CMP without electrolyte abnormality, Cr normal. CBC and troponins pending. CTA pending. ? ?Signout given to Dr. Johnney Killian at 1500 with plan to follow-up labs and CTA, likely admission. ? ? ?Final Clinical Impression(s) / ED Diagnoses ?Final diagnoses:  ?None  ? ? ?Rx / DC Orders ?ED Discharge Orders   ? ? None  ? ?  ? ? ?  ?Regan Lemming, MD ?03/29/22 1927 ? ?  ?Regan Lemming, MD ?03/29/22 1928 ? ?

## 2022-03-29 NOTE — Telephone Encounter (Signed)
Routed to Laurann Montana, NP for review  ?

## 2022-03-29 NOTE — Progress Notes (Signed)
ANTICOAGULATION CONSULT NOTE - Initial Consult ? ?Pharmacy Consult for Heparin ?Indication: atrial fibrillation ? ?Allergies  ?Allergen Reactions  ? Gluten Meal Diarrhea  ? Penicillins Rash and Other (See Comments)  ?  'Many years ago' ?  ? Alendronate Rash  ? Hydroxychloroquine Rash  ? ? ?Patient Measurements: ?Height: 5' 4"  (162.6 cm) ?Weight: 57.6 kg (127 lb) ?IBW/kg (Calculated) : 54.7 ?Heparin Dosing Weight: 57.6 kg ? ?Vital Signs: ?Temp: 98.9 ?F (37.2 ?C) (04/05 1351) ?Temp Source: Oral (04/05 1351) ?BP: 106/81 (04/05 1530) ?Pulse Rate: 148 (04/05 1530) ? ?Labs: ?Recent Labs  ?  03/27/22 ?1153 03/29/22 ?1403  ?HGB 8.4* 9.4*  ?HCT 26.9* 29.6*  ?PLT 41* 49*  ?CREATININE  --  0.93  ?TROPONINIHS  --  20*  ? ? ?Estimated Creatinine Clearance: 45.1 mL/min (by C-G formula based on SCr of 0.93 mg/dL). ? ? ?Medical History: ?Past Medical History:  ?Diagnosis Date  ? Arthritis   ? Rheumatoid arthritis  ? Celiac disease   ? Chronic kidney disease   ? stage 3 from MD notes  ? COPD (chronic obstructive pulmonary disease) ([place])   ? Dyspnea   ? with going up stairs  ? Family history of adverse reaction to anesthesia   ? father had hard time waking up  ? Headache   ? sinus headaches  ? Hot flashes   ? Hypertension   ? Iron deficiency anemia   ? Pneumonia   ? per patient "I have walking pneumonia"  ? ? ?Medications:  ?Scheduled:  ? heparin  2,000 Units Intravenous Once  ? ?Infusions:  ? diltiazem (CARDIZEM) infusion 10 mg/hr (03/29/22 1558)  ? heparin    ? ?PRN:  ? 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  Has know hx afib on metoprolol and previously on Eliquis which was stopped on 4/3 due to pancytopenia and an episode of blood in the toilet bowl noted in 3/27 Oncology notes.  Pharmacy is consulted to dose IV heparin for afib. ? ?Last dose of Eliquis taken Monday 4/3 ? ?Plts 49  (improved from 22 on 3/27 when platelet transfusion given) ? ?Baseline aPTT = 29, Heparin level = 0.27, PT/INR = 15.4/1.2 ? ?Goal of Therapy:  ?Heparin level 0.3-0.7 units/ml ?PTT 66-102 seconds ?Monitor platelets by anticoagulation protocol: Yes ?  ?Plan:  ?Give 2000 units bolus x 1 ?Start heparin infusion at 800 units/hr ?Continue to monitor H&H and platelets ?Check aPTT and heparin level 8hrs after starting - will monitor both since HL will be falsely elevated with recent eliquis use; once levels begin to correlate, can monitor using HL only ? ?Peggyann Juba, PharmD, BCPS ?Pharmacy: [phone] ?03/29/2022,4:13 PM ? ? ?

## 2022-03-29 NOTE — ED Notes (Signed)
Critical from lab  ?Wbc 0.9  ?Neutrophil >0.5  ?Johnney Killian, MD notified ?

## 2022-03-29 NOTE — Telephone Encounter (Signed)
Spoke with pt's husband regarding MyChart message sent today.  Pt's husband stated that the pt is very fatigue and sleeping a lot.  Pt's Beth[place] on 03/27/2022 was 0.3.  Pt's husband denied any fevers.  Pt is c/o of SOB mostly when standing and would like supplemental oxygen.  Pt's husband said they have reached out to Dr. Juline Patch office but has not heard anything.  Pt's husband stated they have not reached out to Dr. Acie Fredrickson.  Pt has a hx of AFIB with has been giving her problems for several weeks.  Instructed pt and spouse to go to the Emergency Room for further evaluation.  Spouse stated he will check with the pt to see if she wants to go to the ER.  Instructed spouse to give Dr. Ernestina Penna office a returned call with the pt's decision so we can notify the ER Charge Nurse of the pt's arrival.  Instructed spouse to be sure to bring the chemotherapy wallet card with them to show to the ED check-in and Triage Nurse so it can expedite the waiting time.  Pt's spouse verbalized understanding of instructions and will call Dr. Ernestina Penna office with the pt's decision on whether to go to the ED or not. ?

## 2022-03-29 NOTE — ED Provider Notes (Signed)
Eliquis stopped 2 days ago due to bleeding complications.  Patient has known history of atrial fibrillation.  Patient's rate was at 200 given Cardizem bolus and drip.  Rate is down to 120s.  No chest pain.  Patient does have pulmonary edema and peripheral edema plan for admission. ?Physical Exam  ?BP 116/80   Pulse (!) 156   Temp 98.9 ?F (37.2 ?C) (Oral)   Resp 16   Ht 5' 4"  (1.626 m)   Wt 57.6 kg   SpO2 97%   BMI 21.80 kg/m?  ? ?Physical Exam ?Constitutional:   ?   Comments: Patient is alert.  Mild increased work of breathing at rest.  Patient has a chronically ill appearance.  ?HENT:  ?   Mouth/Throat:  ?   Pharynx: Oropharynx is clear.  ?Eyes:  ?   Extraocular Movements: Extraocular movements intact.  ?Cardiovascular:  ?   Comments: Tachycardia irregularly irregular ?Pulmonary:  ?   Comments: Mild increased work of breathing at rest.  Speaking in full sentences.  Airflow slightly soft at bases.  Crackles at bases cleared with deep inspiration. ?Musculoskeletal:  ?   Comments: 2+ pitting edema bilateral lower extremities soft  ? ? ?Procedures  ?Procedures ?CRITICAL CARE ?Performed by: Charlesetta Shanks ? ? ?Total critical care time: 30 minutes ? ?Critical care time was exclusive of separately billable procedures and treating other patients. ? ?Critical care was necessary to treat or prevent imminent or life-threatening deterioration. ? ?Critical care was time spent personally by me on the following activities: development of treatment plan with patient and/or surrogate as well as nursing, discussions with consultants, evaluation of patient's response to treatment, examination of patient, obtaining history from patient or surrogate, ordering and performing treatments and interventions, ordering and review of laboratory studies, ordering and review of radiographic studies, pulse oximetry and re-evaluation of patient's condition.  ?ED Course / MDM  ?  ?Medical Decision Making ?Amount and/or Complexity of Data  Reviewed ?Labs: ordered. ?Radiology: ordered. ? ?Risk ?Prescription drug management. ?Decision regarding hospitalization. ? ? ?Reassess 17: 15 patient is on a Cardizem drip at 15 mg/h.  Heart rates are up to 140s.  Patient denies feeling symptomatic.  She denies she has chest pain or that she subjectively feels short of breath.  She is alert.  She reports she would like to have something to eat. ? ?Patient generally reports she has not been feeling badly for the past couple of days.  She denies feeling noticeably more short of breath even though she has had documented rapid atrial fibrillation.  She reports she chronically has a lot of swelling in her legs. ? ?CT scan has findings concerning for pneumonia as well as possible other mass or tumor. ? ?Consult:Dr. Inda Merlin give antibiotic coverage. Add Dr. Burr Medico to treatment team. ? ?  ?Charlesetta Shanks, MD ?03/29/22 1734 ? ?

## 2022-03-29 NOTE — ED Triage Notes (Signed)
Pt c/o shob for a while now that has got progressively worse. Pt reports she has COPD. Shob is causing pt to be able to walk or stand.  ? ?HR elevated in triage ? ?A/Ox4 ? Brought in by husband.  ?

## 2022-03-29 NOTE — Telephone Encounter (Signed)
Spoke with Estill Bamberg in [facility] regarding pt's arrival to ER.  ER Charge Nurse not available at time of call but Estill Bamberg stated she will notify of the pt's expected arrival and current status (ANC 0.3, SOB at rest & standing, and hx of AFIB).  Estill Bamberg stated the ER currently had 3 rooms available but they may not be available at the time of the pt's arrival. Notified Estill Bamberg that pt is neutropenic and currently on chemotherapy.  Asked if when and if a bed is available would they be sure to place the pt in one of these rooms.  Estill Bamberg stated she will notify the ER Charge Nurse of the pt's arrival & status. ?

## 2022-03-29 NOTE — H&P (Signed)
?History and Physical  ? ? ?[name]:[id number] DOB: May 24, 1946 DOA: 03/29/2022 ? ?PCP: [name], MD  ? ?Patient coming from: Home ? ?I have personally briefly reviewed patient's old medical records in [place] ? ?Chief Complaint: Shortness of breath ? ?HPI: [name] is a 76 y.o. female with medical history significant of  CMML, currently on chemotherapy, pancytopenia, rheumatoid arthritis on Remicade, paroxysmal A-fib (off of Eliquis for the last 2 days?  Secondary to bleeding complications versus pancytopenia), hypertension, COPD (follows up with pulmonary as an outpatient) bilateral pulmonary nodules presented with worsening shortness of breath.  Patient has had progressively worsening shortness of breath over the last few weeks/months.  She was evaluated by pulmonary and her Stiolto was switched to brezteri which she could not tolerate and she was switched back to [employer].  Patient denies any chest pain, palpitations, fever, nausea, vomiting, diarrhea, abdominal pain, dysuria, loss of consciousness or seizures.  She complains of intermittent dry cough.  She was recently seen at oncology clinic and was found to have A-fib with RVR for which cardiology had increased the dose of Toprol-XL.  Pulmonary consultation as an outpatient was requested. ? ?ED Course: She was found to be in A-fib with RVR with heart rate going up to 167.  She was started on IV Cardizem drip and heparin drip. CTA chest was negative for pulmonary embolism but showed bilateral pleural effusion, more on the right side with findings suggestive of multifocal pneumonia with numerous pleural-based right-sided masses, increased in size compared to previous CT.  ED provider consulted Dr. Mohamed/oncology who recommended patient be started on antibiotics and Dr. Burr Medico will evaluate the patient in the morning.  ED provider has also reached out to cardiology and is awaiting callback. ?Hospitalist service was called to  evaluate the patient. ? ?Review of Systems: As per HPI otherwise all other systems were reviewed and are negative. ? ? ?Past Medical History:  ?Diagnosis Date  ? Arthritis   ? Rheumatoid arthritis  ? Celiac disease   ? Chronic kidney disease   ? stage 3 from MD notes  ? COPD (chronic obstructive pulmonary disease) ([place])   ? Dyspnea   ? with going up stairs  ? Family history of adverse reaction to anesthesia   ? father had hard time waking up  ? Headache   ? sinus headaches  ? Hot flashes   ? Hypertension   ? Iron deficiency anemia   ? Pneumonia   ? per patient "I have walking pneumonia"  ? ? ?Past Surgical History:  ?Procedure Laterality Date  ? COLONOSCOPY    ? ECTOPIC PREGNANCY SURGERY    ?  x 2  ? IR IMAGING GUIDED PORT INSERTION  01/31/2022  ? REVERSE SHOULDER ARTHROPLASTY Right 02/02/2017  ? Procedure: RIGHT REVERSE SHOULDER ARTHROPLASTY;  Surgeon: Netta Cedars, MD;  Location: [facility];  Service: Orthopedics;  Laterality: Right;  ? ?Social history ? reports that she quit smoking about 18 years ago. Her smoking use included cigarettes. She has a 30.00 pack-year smoking history. She has never used smokeless tobacco. She reports current alcohol use of about 12.0 - 14.0 standard drinks per week. She reports that she does not use drugs. ? ?Allergies  ?Allergen Reactions  ? Gluten Meal Diarrhea  ? Penicillins Rash and Other (See Comments)  ?  'Many years ago' ?  ? Alendronate Rash  ? Hydroxychloroquine Rash  ? ? ?Family History  ?Problem Relation Age of Onset  ?  Peripheral Artery Disease Father   ? Diabetes Father   ? ? ?Prior to Admission medications   ?Medication Sig Start Date End Date Taking? Authorizing Provider  ?acetaminophen (TYLENOL) 500 MG tablet Take 1,000 mg by mouth in the morning.   Yes [provider]  ?Calcium-Magnesium-Vitamin D (CALCIUM 1200+D3 PO) Take 1 tablet by mouth daily.   Yes [provider]  ?denosumab (PROLIA) 60 MG/ML SOSY injection Inject 60 mg into the skin every 6 (six)  months.   Yes [provider]  ?gabapentin (NEURONTIN) 300 MG capsule Take 300 mg by mouth in the morning. 07/21/14  Yes [provider]  ?InFLIXimab (REMICADE IV) Inject 10 mg into the vein every 2 (two) months.   Yes [provider]  ?lidocaine-prilocaine (EMLA) cream Apply to affected area once ?Patient taking differently: Apply 1 application. topically See admin instructions. Use as directed before infusions only 01/15/22  Yes Truitt Merle, MD  ?losartan (COZAAR) 25 MG tablet Take 1 tablet (25 mg total) by mouth daily. 03/24/22 09/20/22 Yes Loel Dubonnet, NP  ?metoprolol succinate (TOPROL-XL) 50 MG 24 hr tablet Take 1.5 tablets (75 mg total) by mouth every morning. 03/27/22  Yes Loel Dubonnet, NP  ?metoprolol tartrate (LOPRESSOR) 25 MG tablet Take one tablet as needed for heart rate greater than 100bpm. If heart rate still greater than 100bpm after 30 minutes, take a second tablet. ?Patient taking differently: Take 25 mg by mouth See admin instructions. Take 25 mg by mouth as needed for a heart rate greater than 100 BPM and an additional 25 mg if heart rate remains above 100 BPM 30 minutes after the initial dose 03/27/22  Yes Loel Dubonnet, NP  ?predniSONE (DELTASONE) 5 MG tablet Take 5 mg by mouth daily with breakfast. 12/12/21  Yes Gavin Pound, MD  ?Tiotropium Bromide-Olodaterol (STIOLTO RESPIMAT) 2.5-2.5 MCG/ACT AERS Inhale 2 puffs into the lungs daily. 05/03/21  Yes Martyn Ehrich, NP  ?vitamin C (ASCORBIC ACID) 500 MG tablet Take 500 mg by mouth daily.   Yes [provider]  ?Budeson-Glycopyrrol-Formoterol (BREZTRI AEROSPHERE) 160-9-4.8 MCG/ACT AERO Inhale 2 puffs into the lungs in the morning and at bedtime. ?Patient not taking: Reported on 03/29/2022 03/10/22   Garner Nash, DO  ? ? ?Physical Exam: ?Vitals:  ? 03/29/22 1530 03/29/22 1627 03/29/22 1645 03/29/22 1745  ?BP: 106/81 120/69 120/72 116/78  ?Pulse: (!) 148 (!) 154 (!) 144 (!) 30  ?Resp: 20 (!) 30 (!) 26  (!) 24  ?Temp:      ?TempSrc:      ?SpO2: 94% 94% 90% 95%  ?Weight:      ?Height:      ? ? ?Constitutional: NAD, calm, comfortable.  Looks chronically ill and deconditioned.  Currently on room air. ?Vitals:  ? 03/29/22 1530 03/29/22 1627 03/29/22 1645 03/29/22 1745  ?BP: 106/81 120/69 120/72 116/78  ?Pulse: (!) 148 (!) 154 (!) 144 (!) 30  ?Resp: 20 (!) 30 (!) 26 (!) 24  ?Temp:      ?TempSrc:      ?SpO2: 94% 94% 90% 95%  ?Weight:      ?Height:      ? ?Eyes: PERRL, lids and conjunctivae normal ?ENMT: Mucous membranes are moist. Posterior pharynx clear of any exudate or lesions. ?Neck: normal, supple, no masses, no thyromegaly ?Respiratory: bilateral decreased breath sounds at bases, with some scattered crackles.  Tachypneic.  Able to complete sentences.  No accessory muscle use.  ?Cardiovascular: S1 S2 positive, tachycardic.  Bilateral lower extremity edema present, more on the left side.  2+ pedal pulses.  ?Abdomen: no tenderness, no masses palpated. No hepatosplenomegaly. Bowel sounds positive.  ?Musculoskeletal: no clubbing / cyanosis. No joint deformity upper and lower extremities.  ?Skin: no obvious ecchymosis/lesions no induration ?Neurologic: CN 2-12 grossly intact. Moving extremities. No focal neurologic deficits.  ?Psychiatric: Affect is mostly flat.  No signs of agitation.  Alert and oriented x 3.  ? ?Labs on Admission: I have personally reviewed following labs and imaging studies ? ?CBC: ?Recent Labs  ?Lab 03/23/22 ?1511 03/27/22 ?1153 03/29/22 ?1403  ?WBC 1.5* 0.9* 0.9*  ?NEUTROABS 0.6* 0.3* 0.2*  ?HGB 8.7* 8.4* 9.4*  ?HCT 26.9* 26.9* 29.6*  ?MCV 103.9* 106.7* 106.5*  ?PLT 31* 41* 49*  ? ?Basic Metabolic Panel: ?Recent Labs  ?Lab 03/29/22 ?1403  ?NA 138  ?K 4.0  ?CL 107  ?CO2 23  ?GLUCOSE 126*  ?BUN 27*  ?CREATININE 0.93  ?CALCIUM 9.2  ? ?GFR: ?Estimated Creatinine Clearance: 45.1 mL/min (by C-G formula based on SCr of 0.93 mg/dL). ?Liver Function Tests: ?Recent Labs  ?Lab 03/29/22 ?1403  ?AST 16  ?ALT 13   ?ALKPHOS 37*  ?BILITOT 1.1  ?PROT 6.4*  ?ALBUMIN 3.5  ? ?No results for input(s): LIPASE, AMYLASE in the last 168 hours. ?No results for input(s): AMMONIA in the last 168 hours. ?Coagulation Profile: ?Recen

## 2022-03-30 ENCOUNTER — Encounter (HOSPITAL_COMMUNITY): Payer: Self-pay | Admitting: Internal Medicine

## 2022-03-30 ENCOUNTER — Inpatient Hospital Stay (HOSPITAL_COMMUNITY): Payer: Medicare Other

## 2022-03-30 ENCOUNTER — Encounter: Payer: Self-pay | Admitting: Hematology

## 2022-03-30 DIAGNOSIS — C931 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: Secondary | ICD-10-CM | POA: Diagnosis not present

## 2022-03-30 DIAGNOSIS — I429 Cardiomyopathy, unspecified: Secondary | ICD-10-CM | POA: Diagnosis not present

## 2022-03-30 DIAGNOSIS — R609 Edema, unspecified: Secondary | ICD-10-CM | POA: Diagnosis not present

## 2022-03-30 DIAGNOSIS — M7989 Other specified soft tissue disorders: Secondary | ICD-10-CM

## 2022-03-30 DIAGNOSIS — J90 Pleural effusion, not elsewhere classified: Secondary | ICD-10-CM | POA: Diagnosis not present

## 2022-03-30 DIAGNOSIS — I4891 Unspecified atrial fibrillation: Secondary | ICD-10-CM | POA: Diagnosis not present

## 2022-03-30 DIAGNOSIS — I480 Paroxysmal atrial fibrillation: Secondary | ICD-10-CM | POA: Diagnosis not present

## 2022-03-30 DIAGNOSIS — Z87898 Personal history of other specified conditions: Secondary | ICD-10-CM | POA: Diagnosis not present

## 2022-03-30 LAB — CBC
HCT: 25.7 % — ABNORMAL LOW (ref 36.0–46.0)
Hemoglobin: 8.1 g/dL — ABNORMAL LOW (ref 12.0–15.0)
MCH: 34 pg (ref 26.0–34.0)
MCHC: 31.5 g/dL (ref 30.0–36.0)
MCV: 108 fL — ABNORMAL HIGH (ref 80.0–100.0)
Platelets: 43 10*3/uL — ABNORMAL LOW (ref 150–400)
RBC: 2.38 MIL/uL — ABNORMAL LOW (ref 3.87–5.11)
RDW: 16.2 % — ABNORMAL HIGH (ref 11.5–15.5)
WBC: 0.5 10*3/uL — CL (ref 4.0–10.5)
nRBC: 0 % (ref 0.0–0.2)

## 2022-03-30 LAB — COMPREHENSIVE METABOLIC PANEL
ALT: 14 U/L (ref 0–44)
AST: 18 U/L (ref 15–41)
Albumin: 3.3 g/dL — ABNORMAL LOW (ref 3.5–5.0)
Alkaline Phosphatase: 34 U/L — ABNORMAL LOW (ref 38–126)
Anion gap: 9 (ref 5–15)
BUN: 27 mg/dL — ABNORMAL HIGH (ref 8–23)
CO2: 20 mmol/L — ABNORMAL LOW (ref 22–32)
Calcium: 8.4 mg/dL — ABNORMAL LOW (ref 8.9–10.3)
Chloride: 105 mmol/L (ref 98–111)
Creatinine, Ser: 0.83 mg/dL (ref 0.44–1.00)
GFR, Estimated: >60 mL/min (ref >60)
Glucose, Bld: 166 mg/dL — ABNORMAL HIGH (ref 70–99)
Potassium: 4.1 mmol/L (ref 3.5–5.1)
Sodium: 134 mmol/L — ABNORMAL LOW (ref 135–145)
Total Bilirubin: 0.8 mg/dL (ref 0.3–1.2)
Total Protein: 5.8 g/dL — ABNORMAL LOW (ref 6.5–8.1)

## 2022-03-30 LAB — HEPARIN LEVEL (UNFRACTIONATED)
Heparin Unfractionated: 0.36 IU/mL (ref 0.30–0.70)
Heparin Unfractionated: 0.42 IU/mL (ref 0.30–0.70)

## 2022-03-30 LAB — LACTATE DEHYDROGENASE, PLEURAL OR PERITONEAL FLUID: LD, Fluid: 66 U/L — ABNORMAL HIGH (ref 3–23)

## 2022-03-30 LAB — ECHOCARDIOGRAM COMPLETE
AR max vel: 2.23 cm2
AV Peak grad: 6.8 mmHg
Ao pk vel: 1.3 m/s
Area-P 1/2: 5.02 cm2
Calc EF: 48 %
Height: 64 in
MV M vel: 3.84 m/s
MV Peak grad: 59 mmHg
S' Lateral: 3.2 cm
Single Plane A2C EF: 49.2 %
Single Plane A4C EF: 48 %
Weight: 2014.12 oz

## 2022-03-30 LAB — BODY FLUID CELL COUNT WITH DIFFERENTIAL
Eos, Fluid: 0 %
Lymphs, Fluid: 67 %
Monocyte-Macrophage-Serous Fluid: 33 % — ABNORMAL LOW (ref 50–90)
Neutrophil Count, Fluid: 0 % (ref 0–25)
Total Nucleated Cell Count, Fluid: UNDETERMINED cu mm (ref 0–1000)

## 2022-03-30 LAB — PROTEIN, PLEURAL OR PERITONEAL FLUID: Total protein, fluid: <3 g/dL

## 2022-03-30 LAB — PROCALCITONIN: Procalcitonin: <0.1 ng/mL

## 2022-03-30 LAB — APTT
aPTT: 51 seconds — ABNORMAL HIGH (ref 24–36)
aPTT: 59 seconds — ABNORMAL HIGH (ref 24–36)

## 2022-03-30 LAB — MAGNESIUM: Magnesium: 1.9 mg/dL (ref 1.7–2.4)

## 2022-03-30 MED ORDER — LIDOCAINE HCL 1 % IJ SOLN
INTRAMUSCULAR | Status: AC
Start: 2022-03-30 — End: 2022-03-30
  Filled 2022-03-30: qty 20

## 2022-03-30 MED ORDER — METOPROLOL SUCCINATE ER 25 MG PO TB24
75.0000 mg | ORAL_TABLET | Freq: Every day | ORAL | Status: DC
  Administered 2022-03-30: 75 mg via ORAL
  Filled 2022-03-30: qty 3

## 2022-03-30 MED ORDER — SODIUM CHLORIDE 0.9 % IV SOLN
2.0000 g | Freq: Two times a day (BID) | INTRAVENOUS | Status: DC
  Administered 2022-03-30 – 2022-04-03 (×8): 2 g via INTRAVENOUS
  Filled 2022-03-30 (×8): qty 12.5

## 2022-03-30 MED ORDER — SODIUM CHLORIDE 0.9% FLUSH
10.0000 mL | Freq: Two times a day (BID) | INTRAVENOUS | Status: DC
  Administered 2022-03-30 – 2022-04-10 (×17): 10 mL

## 2022-03-30 MED ORDER — TRAZODONE HCL 50 MG PO TABS
50.0000 mg | ORAL_TABLET | Freq: Every evening | ORAL | Status: DC | PRN
  Administered 2022-03-30 – 2022-03-31 (×2): 50 mg via ORAL
  Filled 2022-03-30 (×2): qty 1

## 2022-03-30 MED ORDER — SODIUM CHLORIDE 0.9% FLUSH: 10.0000 mL | INTRAVENOUS | Status: DC | PRN

## 2022-03-30 MED ORDER — SODIUM CHLORIDE 0.9 % IV SOLN: INTRAVENOUS | Status: DC | PRN

## 2022-03-30 MED ORDER — FUROSEMIDE 10 MG/ML IJ SOLN
20.0000 mg | Freq: Once | INTRAMUSCULAR | Status: AC
  Administered 2022-03-30: 20 mg via INTRAVENOUS
  Filled 2022-03-30: qty 2

## 2022-03-30 MED ORDER — MELATONIN 3 MG PO TABS
3.0000 mg | ORAL_TABLET | Freq: Once | ORAL | Status: AC
  Administered 2022-03-30: 3 mg via ORAL
  Filled 2022-03-30: qty 1

## 2022-03-30 NOTE — Consult Note (Addendum)
?Cardiology Consultation:  ? ?Patient ID: [name] ?MRN: [id number]; DOB: 02-18-1946 ? ?Admit date: 03/29/2022 ?Date of Consult: 03/30/2022 ? ?PCP:  [name], MD ?  ?[facility] ?Cardiologist:  Mertie Moores, MD  ? ?Patient Profile:  ? ?[name] is a 76 y.o. female with a history of  paroxysmal atrial fibrillation not on anticoagulation, hypertension, hyperlipidemia, CKD stage III, COPD, CMML with assocaited pancytopenia, rheumatoid arthritis, and chronic back pain who is being seen today for evaluation of rapid atrial fibrillation at the request of Dr. Starla Link. ? ?History of Present Illness:  ? ?Ms. [name] is a 76 year old female with the above history who is followed by Dr. Acie Fredrickson.  Patient was referred to Dr. Acie Fredrickson in 09/2021 for further evaluation of atrial fibrillation which was originally diagnosed when she was in the hospital for spinal biopsy few months prior.  However, this seemed to be of very brief episode because follow-up EKG later that same day showed sinus rhythm.  She was maintaining sinus rhythm at visit with Dr. Acie Fredrickson.  She was concerned about anticoagulation but Dr. Acie Fredrickson explained stroke risk and patient agreed to start Eliquis.  Echo was ordered and showed FF 60-65% with normal wall motion and normal diastolic function as well as mild MR.   ? ?She was diagnosed with CMML associated pancytopenia in 10/2021 and was been started on chemotherapy.  Patient was noted to have a recurrent episode of rapid atrial fibrillation on 03/14/2022 at her Oncology office.  EKG at that time showed atrial fibrillation with rate of 150 bpm.  She was instructed to take an extra dose of her Metoprolol.  When she was seen back on 03/20/2022, she noted an episode of blood in the toilet bowl.  Hemoglobin was 7.7 and platelets were 22,000; therefore, she was transfused 1 unit of packed red blood cells and 1 unit of platelets.  She was seen by Laurann Montana, NP, on 03/24/2022 for follow-up  and denied any palpitations but did report increasing dyspnea with minimal activity such as walking to the bathroom.  She does have a history of COPD and follows with pulmonology for this.  EKG was not ordered at this visit but she was felt to be in sinus rhythm based on exam.  Her Oncologist had recommended she reduce her Eliquis to once daily.  Zio monitor was ordered to assess atrial fibrillation burden and plan was to discuss anticoagulation with both Dr. Acie Fredrickson and Dr. Burr Medico.  Her losartan was also reduced due to hypotension.  Dr. Acie Fredrickson recommended stopping Eliquis completely for now given worsening pancytopenia with platelets of 26,000.  She had another episode of elevated heart rates at her Oncology office on 03/27/2022 for another infusion and Metoprolol was again increased.  Due to progressive dyspnea on exertion, patient was ultimately advised to go to the ED for further evaluation. ? ?Patient presented to [facility] ED on 03/29/2022 as directed.  Upon arrival to the ED, EKG showed atrial fibrillation with a rate of 97 bpm.  High-sensitivity troponin essentially negative at 20 >> 18.  BNP elevated at 474.  Chest x-ray showed increased markings in the perihilar regions and lower lung fields suggestive of pulmonary edema or interstitial pneumonia as well as small bilateral pleural effusions.  D-dimer elevated at 2.36. Chest CTA was negative for PE but showed moderate right and trace left pleural effusions with patchy airspace opacities within the right lower lobe and dependent portion of the left upper lobe suspicious for multifocal  pneumonia as well as numerous right-sided pleural-based masses which have increased in size and number compared to previous CT. WBC 0.9, Hgb 9.4, Plts 49. Na 138, K 4.0, Glucose 126, BUN 27, Cr 0.93. AST 16, ALT 13, Alk Phos 37. TSH normal but free T4 mildly elevated at 1.47. Patient was started on IV Cardizem and IV Heparin for atrial fibrillation and was started on antibiotics  due to concern for pneumonia. Cardiology consulted for assistance.  ? ?At the time of this evaluation, patient is receiving breathing treatment.  She is in no acute distress.  Patient that she is completely unaware of her atrial fibrillation.  She denies any palpitations even when her heart rate was going almost 200 bpm.  She states the only thing that brought her in was her breathing.  She describes dyspnea on exertion for about the past week which is new for her.  She notes significant shortness of breath with minimal activities like walking to the bathroom.  She denies any shortness of breath at rest.  No orthopnea or PND.  She does report some lower extremity edema (left greater than right) for the last couple of months.  Denies any chest pain, lightheadedness, dizziness, syncope.  She denies any fevers or nasal congestion.  She does report a nonproductive cough for the past 1 to 2 weeks.  No GI symptoms including nausea, vomiting, diarrhea, abdominal pain.  She has had 2 episodes where she noticed blood in the toilet bowl.  This did not occur when she was urinating or having a bowel movement so unclear exactly where it was coming from.  Last episode occurred on 03/20/2022.  No other abnormal bleeding. ? ?She get up to ambulate to the bathroom earlier in noted slight improvement in her dyspnea on exertion with this.  She states she did not get as tired as she has been and dyspnea did not last as long. ? ?Past Medical History:  ?Diagnosis Date  ? Arthritis   ? Rheumatoid arthritis  ? Celiac disease   ? Chronic kidney disease   ? stage 3 from MD notes  ? COPD (chronic obstructive pulmonary disease) ([place])   ? Dyspnea   ? with going up stairs  ? Family history of adverse reaction to anesthesia   ? father had hard time waking up  ? Headache   ? sinus headaches  ? Hot flashes   ? Hypertension   ? Iron deficiency anemia   ? Pneumonia   ? per patient "I have walking pneumonia"  ? ? ?Past Surgical History:  ?Procedure  Laterality Date  ? COLONOSCOPY    ? ECTOPIC PREGNANCY SURGERY    ?  x 2  ? IR IMAGING GUIDED PORT INSERTION  01/31/2022  ? REVERSE SHOULDER ARTHROPLASTY Right 02/02/2017  ? Procedure: RIGHT REVERSE SHOULDER ARTHROPLASTY;  Surgeon: Netta Cedars, MD;  Location: [facility];  Service: Orthopedics;  Laterality: Right;  ?  ? ?Home Medications:  ?Prior to Admission medications   ?Medication Sig Start Date End Date Taking? Authorizing Provider  ?acetaminophen (TYLENOL) 500 MG tablet Take 1,000 mg by mouth in the morning.   Yes [provider]  ?Calcium-Magnesium-Vitamin D (CALCIUM 1200+D3 PO) Take 1 tablet by mouth daily.   Yes [provider]  ?denosumab (PROLIA) 60 MG/ML SOSY injection Inject 60 mg into the skin every 6 (six) months.   Yes [provider]  ?gabapentin (NEURONTIN) 300 MG capsule Take 300 mg by mouth in the morning. 07/21/14  Yes [provider]  ?InFLIXimab (REMICADE IV) Inject 10 mg into the vein every 2 (two) months.   Yes [provider]  ?lidocaine-prilocaine (EMLA) cream Apply to affected area once ?Patient taking differently: Apply 1 application. topically See admin instructions. Use as directed before infusions only 01/15/22  Yes Truitt Merle, MD  ?losartan (COZAAR) 25 MG tablet Take 1 tablet (25 mg total) by mouth daily. 03/24/22 09/20/22 Yes Loel Dubonnet, NP  ?metoprolol succinate (TOPROL-XL) 50 MG 24 hr tablet Take 1.5 tablets (75 mg total) by mouth every morning. 03/27/22  Yes Loel Dubonnet, NP  ?metoprolol tartrate (LOPRESSOR) 25 MG tablet Take one tablet as needed for heart rate greater than 100bpm. If heart rate still greater than 100bpm after 30 minutes, take a second tablet. ?Patient taking differently: Take 25 mg by mouth See admin instructions. Take 25 mg by mouth as needed for a heart rate greater than 100 BPM and an additional 25 mg if heart rate remains above 100 BPM 30 minutes after the initial dose 03/27/22  Yes Loel Dubonnet, NP  ?predniSONE  (DELTASONE) 5 MG tablet Take 5 mg by mouth daily with breakfast. 12/12/21  Yes Gavin Pound, MD  ?Tiotropium Bromide-Olodaterol (STIOLTO RESPIMAT) 2.5-2.5 MCG/ACT AERS Inhale 2 puffs into the lungs daily.

## 2022-03-30 NOTE — Progress Notes (Signed)
?PROGRESS NOTE ? ? ? ?[name]  XBD:[id number] DOB: [birth year] DOA: 03/29/2022 ?PCP: [name], MD  ? ?Brief Narrative:  ?76 y.o. female with medical history significant of  CMML, currently on chemotherapy, pancytopenia, rheumatoid arthritis on Remicade, paroxysmal A-fib (off of Eliquis for the last 2 days prior to presentation?  Secondary to bleeding complications versus pancytopenia), hypertension, COPD (follows up with pulmonary as an outpatient), bilateral pulmonary nodules presented with worsening shortness of breath.  On presentation, she was found to be in A-fib with RVR and was started on IV Cardizem and heparin drips. CTA chest was negative for pulmonary embolism but showed bilateral pleural effusion, more on the right side with findings suggestive of multifocal pneumonia with numerous pleural-based right-sided masses, increased in size compared to previous CT. She was empirically started on IV antibiotics as per oncology recommendations.  She was put on IV steroids.  Cardiology was consulted by ED provider. ? ?Assessment & Plan: ?  ?Paroxysmal A-fib with RVR ?-Presented with heart rates up to 160s.  No complaints of palpitation.  Currently on Cardizem drip with persistent tachycardia but improving.   ?-Awaiting cardiology recommendations ?-Hold home Toprol-XL because of blood pressure being on the lower side ?-Continue heparin drip.  Eliquis was recently held for the last few days because of?  Pancytopenia/bleeding complications.  Monitor platelets ?  ?Dyspnea ?COPD with possible exacerbation ??  Possible bacterial multifocal pneumonia ?Bilateral pleural effusion, more on the right side ?Multiple pulmonary nodules ?-Patient has had progressively worsening dyspnea over the last few weeks/months.  Apparently did not tolerate brezteri as an outpatient and was switched back to Stiolto by pulmonary.   ?-Continue current inhaled regimen.  Continue Solu-Medrol 60 mg IV every 12 hours.   ?-Currently on  cefepime and Zithromax.  Procalcitonin less than 0.1.  Unclear if her symptoms are because of bacterial pneumonia versus pleural effusion (?  Malignant in origin) ?-We will request ultrasound-guided thoracentesis and pleural fluid analysis including cytology.  Patient is agreeable. ?-BNP not significantly elevated.  Troponins have not trended up ?-Might need to check 2D echo once heart rate improves ?  ?CMML, currently on chemotherapy ?Pancytopenia ?-Follows up with Dr. Burr Medico as an outpatient: Follow recommendations.  I have communicated with Dr. Burr Medico via secure chat and she is also agreeable for proceeding with thoracentesis. ?-Still pancytopenic, platelets 43 today.  No signs of bleeding.   ? ?Hypertension ?-Blood pressure on the lower side.  Monitor. ?  ?History of rheumatoid arthritis ?-Outpatient follow-up with rheumatology.  Is on Remicade as an outpatient. ?  ?Bilateral lower extremity swelling, more on the left side ?-Lower extremity duplex was negative for DVT. ? ? ? ?DVT prophylaxis: Heparin drip ?Code Status: Full ?Family Communication: Husband at bedside ?Disposition Plan: ?Status is: Inpatient ?Remains inpatient appropriate because: Of severity of illness ? ? ? ?Consultants: Cardiology/oncology ? ?Procedures: None ? ?Antimicrobials:  ?Anti-infectives (From admission, onward)  ? ? Start     Dose/Rate Route Frequency Ordered Stop  ? 03/30/22 0800  ceFEPIme (MAXIPIME) 2 g in sodium chloride 0.9 % 100 mL IVPB  Status:  Discontinued       ? 2 g ?200 mL/hr over 30 Minutes Intravenous Every 12 hours 03/29/22 1754 03/29/22 1757  ? 03/30/22 0200  ceFEPIme (MAXIPIME) 2 g in sodium chloride 0.9 % 100 mL IVPB       ? 2 g ?200 mL/hr over 30 Minutes Intravenous Every 8 hours 03/29/22 1757    ? 03/29/22 1800  azithromycin (  ZITHROMAX) 500 mg in sodium chloride 0.9 % 250 mL IVPB       ? 500 mg ?250 mL/hr over 60 Minutes Intravenous Every 24 hours 03/29/22 1747 04/03/22 1759  ? 03/29/22 1745  ceFEPIme (MAXIPIME) 2 g in  sodium chloride 0.9 % 100 mL IVPB       ? 2 g ?200 mL/hr over 30 Minutes Intravenous  Once 03/29/22 1735 03/29/22 2132  ? ?  ? ? ? ?Subjective: ?Patient seen and examined at bedside.  Breathing feels slightly better.  Did not sleep at all last night despite melatonin.  Denies any palpitations.  No overnight fever or vomiting reported. ? ?Objective: ?Vitals:  ? 03/30/22 0730 03/30/22 0800 03/30/22 0805 03/30/22 0900  ?BP:  (!) 113/46  114/62  ?Pulse: (!) 110 99  (!) 118  ?Resp: (!) 26 (!) 28  (!) 29  ?Temp:   98.2 ?F (36.8 ?C)   ?TempSrc:   Oral   ?SpO2: 91% 93%  94%  ?Weight:      ?Height:      ? ? ?Intake/Output Summary (Last 24 hours) at 03/30/2022 1000 ?Last data filed at 03/30/2022 0700 ?Gross per 24 hour  ?Intake 1171.02 ml  ?Output 500 ml  ?Net 671.02 ml  ? ?Filed Weights  ? 03/29/22 1351 03/29/22 1845 03/30/22 0500  ?Weight: 57.6 kg 55.9 kg 57.1 kg  ? ? ?Examination: ? ?General exam: Appears calm and comfortable.  Looks chronically ill and deconditioned.  Currently on room air. ?Respiratory system: Bilateral decreased breath sounds at bases with scattered crackles ?Cardiovascular system: S1 & S2 heard, tachycardic  ?gastrointestinal system: Abdomen is nondistended, soft and nontender. Normal bowel sounds heard. ?Extremities: No cyanosis, clubbing; bilateral lower extremity edema present, more on the left side  ?Central nervous system: Alert and oriented. No focal neurological deficits. Moving extremities ?Skin: No obvious ecchymosis/rashes  ?psychiatry: Not agitated.  Mostly flat affect ? ? ? ?Data Reviewed: I have personally reviewed following labs and imaging studies ? ?CBC: ?Recent Labs  ?Lab 03/23/22 ?1511 03/27/22 ?1153 03/29/22 ?1403 03/30/22 ?2440  ?WBC 1.5* 0.9* 0.9* 0.5*  ?NEUTROABS 0.6* 0.3* 0.2*  --   ?HGB 8.7* 8.4* 9.4* 8.1*  ?HCT 26.9* 26.9* 29.6* 25.7*  ?MCV 103.9* 106.7* 106.5* 108.0*  ?PLT 31* 41* 49* 43*  ? ?Basic Metabolic Panel: ?Recent Labs  ?Lab 03/29/22 ?1403 03/30/22 ?1027  ?NA 138 134*  ?K  4.0 4.1  ?CL 107 105  ?CO2 23 20*  ?GLUCOSE 126* 166*  ?BUN 27* 27*  ?CREATININE 0.93 0.83  ?CALCIUM 9.2 8.4*  ?MG  --  1.9  ? ?GFR: ?Estimated Creatinine Clearance: 50.6 mL/min (by C-G formula based on SCr of 0.83 mg/dL). ?Liver Function Tests: ?Recent Labs  ?Lab 03/29/22 ?1403 03/30/22 ?2536  ?AST 16 18  ?ALT 13 14  ?ALKPHOS 37* 34*  ?BILITOT 1.1 0.8  ?PROT 6.4* 5.8*  ?ALBUMIN 3.5 3.3*  ? ?No results for input(s): LIPASE, AMYLASE in the last 168 hours. ?No results for input(s): AMMONIA in the last 168 hours. ?Coagulation Profile: ?Recent Labs  ?Lab 03/29/22 ?1403  ?INR 1.2  ? ?Cardiac Enzymes: ?No results for input(s): CKTOTAL, CKMB, CKMBINDEX, TROPONINI in the last 168 hours. ?BNP (last 3 results) ?No results for input(s): PROBNP in the last 8760 hours. ?HbA1C: ?No results for input(s): HGBA1C in the last 72 hours. ?CBG: ?Recent Labs  ?Lab 03/29/22 ?2042  ?GLUCAP 151*  ? ?Lipid Profile: ?No results for input(s): CHOL, HDL, LDLCALC, TRIG, CHOLHDL, LDLDIRECT in the last  72 hours. ?Thyroid Function Tests: ?Recent Labs  ?  03/29/22 ?1403  ?TSH 0.731  ?FREET4 1.47*  ? ?Anemia Panel: ?No results for input(s): VITAMINB12, FOLATE, FERRITIN, TIBC, IRON, RETICCTPCT in the last 72 hours. ?Sepsis Labs: ?Recent Labs  ?Lab 03/30/22 ?1610  ?PROCALCITON <0.10  ? ? ?Recent Results (from the past 240 hour(s))  ?Resp Panel by RT-PCR (Flu A&B, Covid) Nasopharyngeal Swab     Status: None  ? Collection Time: 03/29/22  5:38 PM  ? Specimen: Nasopharyngeal Swab; Nasopharyngeal(NP) swabs in vial transport medium  ?Result Value Ref Range Status  ? SARS Coronavirus 2 by RT PCR NEGATIVE NEGATIVE Final  ?  Comment: (NOTE) ?SARS-CoV-2 target nucleic acids are NOT DETECTED. ? ?The SARS-CoV-2 RNA is generally detectable in upper respiratory ?specimens during the acute phase of infection. The lowest ?concentration of SARS-CoV-2 viral copies this assay can detect is ?138 copies/mL. A negative result does not preclude SARS-Cov-2 ?infection and  should not be used as the sole basis for treatment or ?other patient management decisions. A negative result may occur with  ?improper specimen collection/handling, submission of specimen other ?than naso

## 2022-03-30 NOTE — Progress Notes (Signed)
ANTICOAGULATION CONSULT NOTE - follow up ? ?Pharmacy Consult for Heparin ?Indication: atrial fibrillation ? ?Allergies  ?Allergen Reactions  ? Gluten Meal Diarrhea  ? Penicillins Rash and Other (See Comments)  ?  'Many years ago' ?  ? Alendronate Rash  ? Hydroxychloroquine Rash  ? ? ?Patient Measurements: ?Height: 5' 4"  (162.6 cm) ?Weight: 55.9 kg (123 lb 3.8 oz) ?IBW/kg (Calculated) : 54.7 ?Heparin Dosing Weight: 57.6 kg ? ?Vital Signs: ?Temp: 97.8 ?F (36.6 ?C) (04/06 0000) ?Temp Source: Oral (04/06 0000) ?BP: 115/54 (04/06 0000) ?Pulse Rate: 117 (04/06 0000) ? ?Labs: ?Recent Labs  ?  03/27/22 ?1153 03/29/22 ?1403 03/29/22 ?1603 03/30/22 ?0923  ?HGB 8.4* 9.4*  --  8.1*  ?HCT 26.9* 29.6*  --  25.7*  ?PLT 41* 49*  --  43*  ?APTT  --  29  --  51*  ?LABPROT  --  15.4*  --   --   ?INR  --  1.2  --   --   ?HEPARINUNFRC  --  0.27*  --  0.36  ?CREATININE  --  0.93  --  0.83  ?TROPONINIHS  --  20* 18*  --   ? ? ? ?Estimated Creatinine Clearance: 50.6 mL/min (by C-G formula based on SCr of 0.83 mg/dL). ? ? ?Medical History: ?Past Medical History:  ?Diagnosis Date  ? Arthritis   ? Rheumatoid arthritis  ? Celiac disease   ? Chronic kidney disease   ? stage 3 from MD notes  ? COPD (chronic obstructive pulmonary disease) ([place])   ? Dyspnea   ? with going up stairs  ? Family history of adverse reaction to anesthesia   ? father had hard time waking up  ? Headache   ? sinus headaches  ? Hot flashes   ? Hypertension   ? Iron deficiency anemia   ? Pneumonia   ? per patient "I have walking pneumonia"  ? ? ?Medications:  ?Scheduled:  ? arformoterol  15 mcg Nebulization BID  ? And  ? umeclidinium bromide  1 puff Inhalation Daily  ? budesonide (PULMICORT) nebulizer solution  2 mg Nebulization Q12H  ? Chlorhexidine Gluconate Cloth  6 each Topical Daily  ? losartan  25 mg Oral Daily  ? mouth rinse  15 mL Mouth Rinse BID  ? methylPREDNISolone (SOLU-MEDROL) injection  60 mg Intravenous Q12H  ? metoprolol succinate  75 mg Oral q morning   ? ?Infusions:  ? azithromycin Stopped (03/29/22 2307)  ? ceFEPime (MAXIPIME) IV 2 g (03/30/22 0122)  ? diltiazem (CARDIZEM) infusion 15 mg/hr (03/30/22 0100)  ? heparin 800 Units/hr (03/30/22 0100)  ? ?PRN:  ? 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  Has know hx afib on metoprolol and previously on Eliquis which was stopped on 4/3 due to pancytopenia and an episode of blood in the toilet bowl noted in 3/27 Oncology notes.  Pharmacy is consulted to dose IV heparin for afib. ? ?Last dose of Eliquis taken Monday 4/3 ? ?Plts 49 (improved from 22 on 3/27 when platelet transfusion given) ? ?Baseline aPTT = 29, Heparin level = 0.27, PT/INR = 15.4/1.2 ? ?03/30/2022 ?aPTT 51 subtherapeutic on 800 units/hr ?HL 0.36 ?Hgb down to 8.1, plts down to 43 ?Per RN no bleeding ? ?Goal of Therapy:  ?Heparin level 0.3-0.7 units/ml ?PTT 66-102 seconds ?Monitor platelets by anticoagulation protocol: Yes ?  ?  Plan:  ?Increase heparin drip to 900 units/hr ?Aptt and heparin level in 8  hours ?Daily CBC ? ?Dolly Rias RPh ?03/30/2022, 1:45 AM ? ? ?

## 2022-03-30 NOTE — Progress Notes (Addendum)
HEMATOLOGY-ONCOLOGY PROGRESS NOTE  ASSESSMENT AND PLAN: 1.  CMML 2.  Pancytopenia secondary to #1 and recent chemotherapy 3.  Pleural effusion 4.  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  Possible bacterial pneumonia 6.  Hypertension 7.  History of RA 8.  Bilateral lower extremity edema  -We will continue to hold azacitidine.  We will consider reaching back out to [facility] for further recommendations regarding chemotherapy in the future. -Monitor CBC closely.  Recommend PRBC transfusion for hemoglobin less than 7.5.  Recommend platelet transfusion for platelet count less than 10,000.  -Recommend ultrasound-guided thoracentesis and send pleural fluid for cytology. -Was previously on Eliquis and has been placed on a heparin drip.  Okay to continue heparin unless platelet count is less than 30,000 or active bleeding. -Antibiotics per hospitalist for possible pneumonia.  Clenton Pare, DNP, AGPCNP-BC, AOCNP  SUBJECTIVE: [name] is followed by our office for CMML.  She has been on treatment with azacitidine and is status post 2 cycles.  She was due to begin cycle #3 on 03/27/2022 but this was held due to pancytopenia and poor performance status.  She has developed progressive dyspnea and came to the emergency department for further evaluation.  In the ED, she was found to be in A-fib with RVR.  Heart rate was in the 160s.  She has been started on IV Cardizem and heparin drip.  A CTA chest was performed which was negative for PE but did show moderate right and trace left pleural effusions with patchy airspace opacities within the right lower lobe and dependent portion of the left upper lobe which is suspicious for multifocal pneumonia.  She also had numerous right-sided pleural-based masses which have increased in size and number compared to the prior CT.  She has been seen by cardiology and they are continuing the Cardizem drip and restarting her home Toprol-XL.  They are also planning to check an  echocardiogram and give 1 dose of Lasix to see if that helps her dyspnea given her elevated BNP.  Today, the patient reports that she has improvement in her shortness of breath at rest.  However, she has not been up moving around.  She is not having any fevers.  She has no bleeding.  She currently denies chest pain.  Oncology History  CMML (chronic myelomonocytic leukemia) (HCC)  11/09/2021 Initial Biopsy   DIAGNOSIS:   -  Monoclonal B-cell population with co-expression of CD5 comprises 17%  of all lymphocytes  -  See comment   COMMENT:  In addition to the clonal B-cell population, there is a myeloblast  population (CD34, CD38, HLA-DR, CD117, CD123 and CD33) that comprises 2% of the total cellular events.  Please see concurrent tissue biopsy (below) for additional work-up and final diagnosis.    FINAL MICROSCOPIC DIAGNOSIS:   A. SOFT TISSUE MASS, PRE SACRAL, NEEDLE CORE BIOPSY:  -  Chronic lymphocytic leukemia/small lymphocytic lymphoma  -  Extra medullary hematopoiesis  -  See comment   COMMENT:  The biopsy consists of multiple soft tissue cores with lymphoid nodules and a dense hematopoietic infiltrate consistent with extra medullary hematopoiesis.  MPO and E-cadherin highlight myeloid and erythroid precursors respectively.  CD34 highlights increased vasculature and is also positive within the cytoplasm of megakaryocytes.  A few small, immature mononuclear cells appear to be positive for CD34 and CD117. TdT shows rare, scattered positive cells.  CD20 highlights aggregates of B cells which are admixed with CD3 positive T cells.  T cells are an admixture of  CD4 and CD8.  The B cells are also positive for CD5, CD23 and Bcl-2.  The B cells do not show significant staining for CD10, BCL6 or cyclin D1.  CD138 highlights scattered plasma cells which are polytypic by kappa and lambda in situ hybridization.  Flow cytometry performed on the sample (see [facility] [id number]) identified a kappa restricted  CD5 positive B-cell population comprising 70% of lymphocytes.  In addition, a small myeloblast population comprised 2% of the total cellular events.   Overall, the findings are consistent with soft tissue involvement by  chronic lymphocytic leukemia/small lymphocytic lymphoma and extra medullary hematopoiesis. In reviewing the patient's CBC data (macrocytic anemia and thrombocytopenia), I would recommend a bone marrow biopsy to assess for marrow involvement by CLL/SLL.    12/23/2021 Imaging   EXAM: CT CHEST, ABDOMEN, AND PELVIS WITH CONTRAST  IMPRESSION: 1. Slight interval enlargement of a presacral soft tissue mass measuring 7.2 x 4.7 cm, previously 6.9 x 4.1 cm on prior MR of the pelvis dated 07/04/2021. By report, this represents a biopsy proven lymphoma. 2. Pleural nodule of the dependent right lower lobe overlying the posterior right tenth rib and pleural or paraspinous soft tissue mass overlying the right aspect of the T10 vertebral body, very slightly enlarged compared to prior examination of the chest dated 06/25/2020, consistent with additional sites of lymphomatous involvement given very indolent growth. These could be better assessed for metabolic activity by FDG PET/CT if desired. 3. There is mild, bibasilar predominant pulmonary fibrosis in a pattern featuring irregular peripheral interstitial opacity, septal thickening, but without clear evidence of subpleural bronchiolectasis or honeycombing, with a somewhat asymmetric distribution most conspicuously involving the right lower lobe and lingula. These findings are significantly worsened when compared to prior examination dated 06/25/2020, particularly in the right lower lobe. Given interval change, this may reflect sequelae of interval infection or aspiration, however appearance is generally suspicious for fibrotic interstitial lung disease, and if characterized by ATS pulmonary fibrosis is in an "indeterminate for UIP"  pattern, differential considerations including both UIP and NSIP. 4. Emphysema.   Aortic Atherosclerosis (ICD10-I70.0) and Emphysema (ICD10-J43.9).   01/05/2022 Pathology Results   DIAGNOSIS:   BONE MARROW, ASPIRATE, CLOT, CORE:  -Hypercellular bone marrow for age with features of  myelodysplastic/myeloproliferative neoplasm  -Minor abnormal B-cell population  -See comment   PERIPHERAL BLOOD:  -Macrocytic anemia  -Neutrophilic left shift and monocytosis  -Thrombocytopenia   COMMENT:  The bone marrow is hypercellular for age with dyspoietic changes  involving myeloid cell lines associated with monocytosis and increased number of blastic cells (12%) as primarily seen by morphology, many of which display monocytic features.  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  In this background, there are several predominantly small lymphoid aggregates mostly composed of small lymphoid cells.  By flow cytometry, a minor abnormal B-cell population expressing CD5 is seen and representing 2% of all cells.  This correlate with previously known B-cell lymphoproliferative process.  Correlation with cytogenetic and FISH studies is strongly recommended.    DIAGNOSIS:   -Increased number of monocytic cells present (25%)  -Minor abnormal B-cell population identified.  -See comment   COMMENT:  Flow cytometric analysis shows increased number of monocytic cells representing 25% of all cells but without aberrant phenotype or CD34 expression.  A significant CD34-positive blastic population is not identified.  The lymphoid population shows a minor B-cell population representing 2% of  all cells and expressing B-cell antigens including CD20 associated with CD5, CD200 and possibly dim kappa expression.  The latter findings are abnormal and  correlate with previously known B-cell lymphoproliferative process.  No significant T-cell phenotypic abnormalities identified.    01/12/2022 Initial Diagnosis   CMML (chronic myelomonocytic leukemia) (HCC)   01/12/2022 Cancer Staging   Staging form: Chronic Myeloid Leukemia, AJCC 8th Edition - Clinical stage from 01/12/2022: Bone marrow blast count (%): 12, Additional clonal changes: Unknown - Signed by Malachy Mood, MD on 01/12/2022 Stage prefix: Initial diagnosis    02/06/2022 -  Chemotherapy   Patient is on Treatment Plan : MYELODYSPLASIA  Azacitidine IV D1-7 q28d        REVIEW OF SYSTEMS:   Review of Systems  Constitutional:  Positive for malaise/fatigue. Negative for chills and fever.  HENT: Negative.    Respiratory:         Reports dyspnea with exertion  Cardiovascular:  Negative for chest pain and palpitations.  Gastrointestinal:  Negative for abdominal pain, nausea and vomiting.  Skin: Negative.   Neurological: Negative.   Endo/Heme/Allergies: Negative.   Psychiatric/Behavioral: Negative.     I have reviewed the past medical history, past surgical history, social history and family history with the patient and they are unchanged from previous note.   PHYSICAL EXAMINATION: ECOG PERFORMANCE STATUS: 3 - Symptomatic, >50% confined to bed  Vitals:   03/30/22 0805 03/30/22 0900  BP:  114/62  Pulse:  (!) 118  Resp:  (!) 29  Temp: 98.2 F (36.8 C)   SpO2:  94%   Filed Weights   03/29/22 1351 03/29/22 1845 03/30/22 0500  Weight: 57.6 kg 55.9 kg 57.1 kg    Intake/Output from previous day: 04/05 0701 - 04/06 0700 In: 1171 [P.O.:360; I.V.:360.8; IV Piggyback:450.2] Out: 500 [Urine:500]  Physical Exam Vitals reviewed.  HENT:     Head: Normocephalic.  Eyes:     General: No scleral icterus.    Conjunctiva/sclera: Conjunctivae normal.  Cardiovascular:     Rate and Rhythm: Tachycardia present. Rhythm irregular.     Comments: 1+ edema Pulmonary:     Effort: Pulmonary  effort is normal. No respiratory distress.     Breath sounds: Rales present.  Abdominal:     General: There is no distension.     Palpations: Abdomen is soft.     Tenderness: There is no abdominal tenderness.  Skin:    General: Skin is warm and dry.     Findings: Bruising present.  Neurological:     Mental Status: She is alert. Mental status is at baseline.    LABORATORY DATA:  I have reviewed the data as listed    Latest Ref Rng & Units 03/30/2022   12:42 AM 03/29/2022    2:03 PM 03/20/2022    9:47 AM  CMP  Glucose 70 - 99 mg/dL 409   811   914    BUN 8 - 23 mg/dL 27   27   27     Creatinine 0.44 - 1.00 mg/dL 7.82   9.56   2.13    Sodium 135 - 145 mmol/L 134   138   135    Potassium 3.5 - 5.1 mmol/L 4.1   4.0   3.9    Chloride 98 - 111 mmol/L 105   107   100    CO2 22 - 32 mmol/L 20   23   25     Calcium 8.9 - 10.3 mg/dL 8.4   9.2  8.9    Total Protein 6.5 - 8.1 g/dL 5.8   6.4   5.8    Total Bilirubin 0.3 - 1.2 mg/dL 0.8   1.1   1.0    Alkaline Phos 38 - 126 U/L 34   37   34    AST 15 - 41 U/L 18   16   13     ALT 0 - 44 U/L 14   13   12       Lab Results  Component Value Date   WBC 0.5 (LL) 03/30/2022   HGB 8.1 (L) 03/30/2022   HCT 25.7 (L) 03/30/2022   MCV 108.0 (H) 03/30/2022   PLT 43 (L) 03/30/2022   NEUTROABS 0.2 (LL) 03/29/2022    No results found for: CEA1, CEA, WGN562, CA125, PSA1  CT Angio Chest PE W and/or Wo Contrast  Result Date: 03/29/2022 CLINICAL DATA:  Tachycardia, shortness of breath. Pulmonary embolism (PE) suspected, high prob. History of chronic myelomonocytic leukemia EXAM: CT ANGIOGRAPHY CHEST WITH CONTRAST TECHNIQUE: Multidetector CT imaging of the chest was performed using the standard protocol during bolus administration of intravenous contrast. Multiplanar CT image reconstructions and MIPs were obtained to evaluate the vascular anatomy. RADIATION DOSE REDUCTION: This exam was performed according to the departmental dose-optimization program which  includes automated exposure control, adjustment of the mA and/or kV according to patient size and/or use of iterative reconstruction technique. CONTRAST:  75mL OMNIPAQUE IOHEXOL 350 MG/ML SOLN COMPARISON:  X-ray 03/29/2022, CT 12/23/2021 FINDINGS: Cardiovascular: Satisfactory opacification of the pulmonary arteries to the segmental level. No evidence of pulmonary embolism. Thoracic aorta is nonaneurysmal. Atherosclerotic calcifications of the aorta and coronary arteries. Mild cardiomegaly. No pericardial effusion. Right-sided chest port in place. Mediastinum/Nodes: No enlarged mediastinal, hilar, or axillary lymph nodes. Thyroid gland, trachea, and esophagus demonstrate no significant findings. Lungs/Pleura: Moderate right and trace left pleural effusions. Patchy airspace opacities within the right lower lobe and dependent portion of the left upper lobe. There is also mild left lower lobe opacity. No pneumothorax. Numerous right-sided pleural based masses, which have increased in size and number compared to the previous CT. Reference mass includes 2.7 x 1.0 cm mass overlying the posterior right tenth rib (series 4, image 105), previously 2.0 x 0.9 cm. Upper Abdomen: Stable cyst in the anterior left hepatic lobe. No acute findings within the included upper abdomen. Musculoskeletal: Right-sided paraspinal soft tissue mass at the T10 level measures approximately 4.7 x 2.0 cm (series 4, image 103), previously 4.2 x 2.1 cm. No acute bony findings are seen. Review of the MIP images confirms the above findings. IMPRESSION: 1. No evidence of acute pulmonary embolism. 2. Moderate right and trace left pleural effusions with patchy airspace opacities within the right lower lobe and dependent portion of the left upper lobe, suspicious for multifocal pneumonia. 3. Numerous right-sided pleural based masses, which have increased in size and number compared to the previous CT. 4. Right-sided paraspinal soft tissue mass at the T10  level has slightly increased in size and number compared to the previous CT. 5. Aortic and coronary artery atherosclerosis (ICD10-I70.0). Electronically Signed   By: Duanne Guess D.O.   On: 03/29/2022 16:17   DG Chest Portable 1 View  Result Date: 03/29/2022 CLINICAL DATA:  Atrial fibrillation, shortness of breath EXAM: PORTABLE CHEST 1 VIEW COMPARISON:  08/31/2021 FINDINGS: Transverse diameter of heart is increased. Increased interstitial markings are seen in the parahilar regions and lower lung fields. There is blunting of both lateral CP angles. There  is no pneumothorax. Tip of right IJ chest port is seen at the junction of superior vena cava and right atrium. There is previous right shoulder arthroplasty. IMPRESSION: Increased markings in the parahilar regions and lower lung fields suggest pulmonary edema or interstitial pneumonia. Small bilateral pleural effusions are seen. Electronically Signed   By: Ernie Avena M.D.   On: 03/29/2022 15:30   VAS [place] LOWER EXTREMITY VENOUS (DVT)  Result Date: 03/30/2022  Lower Venous DVT Study Patient Name:  [name]  Date of Exam:   03/30/2022 Medical Rec #: [id number]         Accession #:    [id number] Date of Birth: Mar 05, 1946        Patient Gender: F Patient Age:   49 years Exam Location:  [facility] Procedure:      VAS [place] LOWER EXTREMITY VENOUS (DVT) Referring Phys: Glade Lloyd --------------------------------------------------------------------------------  Indications: Swelling, and Edema.  Comparison Study: no prior Performing Technologist: [place] Ponder RVS  Examination Guidelines: A complete evaluation includes B-mode imaging, spectral Doppler, color Doppler, and power Doppler as needed of all accessible portions of each vessel. Bilateral testing is considered an integral part of a complete examination. Limited examinations for reoccurring indications may be performed as noted. The reflux portion of the exam is performed with the  patient in reverse Trendelenburg.  +---------+---------------+---------+-----------+----------+--------------+ RIGHT    CompressibilityPhasicitySpontaneityPropertiesThrombus Aging +---------+---------------+---------+-----------+----------+--------------+ CFV      Full           Yes      Yes                                 +---------+---------------+---------+-----------+----------+--------------+ SFJ      Full                                                        +---------+---------------+---------+-----------+----------+--------------+ FV Prox  Full                                                        +---------+---------------+---------+-----------+----------+--------------+ FV Mid   Full                                                        +---------+---------------+---------+-----------+----------+--------------+ FV DistalFull                                                        +---------+---------------+---------+-----------+----------+--------------+ PFV      Full                                                        +---------+---------------+---------+-----------+----------+--------------+  POP      Full           Yes      Yes                                 +---------+---------------+---------+-----------+----------+--------------+ PTV      Full                                                        +---------+---------------+---------+-----------+----------+--------------+ PERO     Full                                                        +---------+---------------+---------+-----------+----------+--------------+   +---------+---------------+---------+-----------+----------+--------------+ LEFT     CompressibilityPhasicitySpontaneityPropertiesThrombus Aging +---------+---------------+---------+-----------+----------+--------------+ CFV      Full           Yes      Yes                                  +---------+---------------+---------+-----------+----------+--------------+ SFJ      Full                                                        +---------+---------------+---------+-----------+----------+--------------+ FV Prox  Full                                                        +---------+---------------+---------+-----------+----------+--------------+ FV Mid   Full                                                        +---------+---------------+---------+-----------+----------+--------------+ FV DistalFull                                                        +---------+---------------+---------+-----------+----------+--------------+ PFV      Full                                                        +---------+---------------+---------+-----------+----------+--------------+ POP      Full           Yes      Yes                                 +---------+---------------+---------+-----------+----------+--------------+  PTV      Full                                                        +---------+---------------+---------+-----------+----------+--------------+ PERO     Full                                                        +---------+---------------+---------+-----------+----------+--------------+     Summary: BILATERAL: - No evidence of deep vein thrombosis seen in the lower extremities, bilaterally. -No evidence of popliteal cyst, bilaterally.   *See table(s) above for measurements and observations.    Preliminary      Future Appointments  Date Time Provider Department Center  03/30/2022  3:00 PM [facility] 1-[facility]  03/31/2022 10:30 AM Noreene Larsson, RD [facility] None  04/03/2022  1:00 PM [facility] FLUSH [facility] None  04/03/2022  1:40 PM Malachy Mood, MD [facility] None  04/03/2022  2:30 PM [facility] INFUSION [facility] None  04/03/2022  3:15 PM Alphonse Guild, RD [facility] None  04/04/2022 10:00 AM [facility]  [facility]  04/10/2022  2:30 PM [facility] FLUSH [facility] None  04/10/2022  3:30 PM [facility] INFUSION [facility] None  04/11/2022  3:30 PM [facility] INFUSION [facility] None  04/12/2022  3:15 PM [facility] INFUSION [facility] None  04/13/2022  3:30 PM [facility] INFUSION [facility] None  04/14/2022  3:30 PM [facility] INFUSION [facility] None  04/17/2022  3:00 PM [facility] FLUSH [facility] None  04/24/2022  3:00 PM [facility] FLUSH [facility] None  04/28/2022 10:20 AM [name], Deloris Ping, MD [facility]      LOS: 1 day   Addendum  I have seen the patient, examined her. I agree with the assessment and and plan and have edited the notes.   I have reviewed her CT scan images from yesterday which showed moderate right pleural effusion with patchy airspace opacities.  She has numerous right-sided pleural-based masses, and paraspinal soft tissue masses, both increased compared to prior CT scan, this is concerning for CMML progression.  I agree with right thoracentesis, will send cytology.  She has had 2 cycles of azacitidine for CMML, this is concerning for resistant disease. I will discuss with Dr. Sharyne Richters at [facility] for other treatment options. Appreciate cardiac and hospitalist's care for her, I will f/u when she is in hospital.  Blood transfusion as needed, she has severe neutropenia, will hold on G-CSF unless she develops fever, due to the nature of her CMML disease.   Malachy Mood  03/30/2022

## 2022-03-30 NOTE — Progress Notes (Signed)
PHARMACY NOTE:  ANTIMICROBIAL RENAL DOSAGE ADJUSTMENT ? ?Current antimicrobial regimen includes a mismatch between antimicrobial dosage and estimated renal function.  As per policy approved by the Pharmacy & Therapeutics and Medical Executive Committees, the antimicrobial dosage will be adjusted accordingly. ? ?Current antimicrobial dosage:  Cefepime 2g IV q8 ? ?Indication: PNA, FN ? ?Renal Function: ? ?Estimated Creatinine Clearance: 50.6 mL/min (by C-G formula based on SCr of 0.83 mg/dL). ?[]      On intermittent HD, scheduled: ?[]      On CRRT ?   ?Antimicrobial dosage has been changed to:  Cefepime 2g IV q12 ? ?Additional comments: ? ? ?Thank you for allowing pharmacy to be a part of this patient's care. ? ?Kara Mead, [facility] ?03/30/2022 10:23 AM ?

## 2022-03-30 NOTE — Progress Notes (Signed)
ANTICOAGULATION CONSULT NOTE - follow up ? ?Pharmacy Consult for Heparin ?Indication: atrial fibrillation ? ?Allergies  ?Allergen Reactions  ? Gluten Meal Diarrhea  ? Penicillins Rash and Other (See Comments)  ?  'Many years ago' ?  ? Alendronate Rash  ? Hydroxychloroquine Rash  ? ? ?Patient Measurements: ?Height: 5' 4"  (162.6 cm) ?Weight: 57.1 kg (125 lb 14.1 oz) ?IBW/kg (Calculated) : 54.7 ?Heparin Dosing Weight: 57.6 kg ? ?Vital Signs: ?Temp: 98.2 ?F (36.8 ?C) (04/06 0805) ?Temp Source: Oral (04/06 0805) ?BP: 113/64 (04/06 1045) ?Pulse Rate: 108 (04/06 1045) ? ?Labs: ?Recent Labs  ?  03/27/22 ?1153 03/29/22 ?1403 03/29/22 ?1603 03/30/22 ?3557 03/30/22 ?0946  ?HGB 8.4* 9.4*  --  8.1*  --   ?HCT 26.9* 29.6*  --  25.7*  --   ?PLT 41* 49*  --  43*  --   ?APTT  --  29  --  51* 59*  ?LABPROT  --  15.4*  --   --   --   ?INR  --  1.2  --   --   --   ?HEPARINUNFRC  --  0.27*  --  0.36 0.42  ?CREATININE  --  0.93  --  0.83  --   ?TROPONINIHS  --  20* 18*  --   --   ? ? ? ?Estimated Creatinine Clearance: 50.6 mL/min (by C-G formula based on SCr of 0.83 mg/dL). ? ? ?Medical History: ?Past Medical History:  ?Diagnosis Date  ? Arthritis   ? Rheumatoid arthritis  ? Celiac disease   ? Chronic kidney disease   ? stage 3 from MD notes  ? COPD (chronic obstructive pulmonary disease) ([place])   ? Dyspnea   ? with going up stairs  ? Family history of adverse reaction to anesthesia   ? father had hard time waking up  ? Headache   ? sinus headaches  ? Hot flashes   ? Hypertension   ? Iron deficiency anemia   ? Pneumonia   ? per patient "I have walking pneumonia"  ? ? ?Medications:  ?Scheduled:  ? arformoterol  15 mcg Nebulization BID  ? And  ? umeclidinium bromide  1 puff Inhalation Daily  ? budesonide (PULMICORT) nebulizer solution  2 mg Nebulization Q12H  ? Chlorhexidine Gluconate Cloth  6 each Topical Daily  ? methylPREDNISolone (SOLU-MEDROL) injection  60 mg Intravenous Q12H  ? metoprolol succinate  75 mg Oral Daily  ? ?Infusions:  ?  azithromycin Stopped (03/29/22 2307)  ? ceFEPime (MAXIPIME) IV    ? diltiazem (CARDIZEM) infusion 15 mg/hr (03/30/22 0700)  ? heparin 900 Units/hr (03/30/22 0700)  ? ?PRN:  ? 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  Has known hx afib on metoprolol and previously on Eliquis which was stopped on 4/3 due to pancytopenia and an episode of blood in the toilet bowl noted in 3/27 Oncology notes.  Pharmacy is consulted to dose IV heparin for afib. ? ?Last dose of Eliquis taken Monday 4/3 ? ?Plts 43 - improved from 22 on 3/27 when platelet transfusion given but down from 49k yestrday ? ?Baseline aPTT = 29, Heparin level = 0.27, PT/INR = 15.4/1.2 ? ?03/30/2022 ?Heparin level therapeutic on current IV heparin rate of 900 units/hr ?Hgb down to 8.1, plts down to 43 - cards aware, low threshold to stop heparin ?Per RN,  ? ?  Goal of Therapy:  ?Heparin level 0.3-0.7 units/ml ?Monitor platelets by anticoagulation protocol: Yes ?  ?Plan:  ?Continue IV heparin drip at current rate of 900 units/hr ?Daily CBC and heparin level ? ? ?Adrian Saran, PharmD, BCPS ?Secure Chat if ?s ?03/30/2022 11:02 AM ? ? ?

## 2022-03-30 NOTE — Progress Notes (Signed)
Lower extremity venous has been completed.  ? ?Preliminary results in CV Proc.  ? ?Tavoris Brisk Holle Sprick ?03/30/2022 8:41 AM    ?

## 2022-03-30 NOTE — Procedures (Signed)
Ultrasound-guided diagnostic and therapeutic right thoracentesis performed yielding 570 cc of blood-tinged amber fluid. No immediate complications. Follow-up chest x-ray pending. The fluid was sent to the lab for preordered studies. EBL none.      ? ?

## 2022-03-30 NOTE — Clinical Social Work Note (Signed)
?  Transition of Care (TOC) Screening Note ? ? ?Patient Details  ?Name: [name] ?Date of Birth: 1946/05/27 ? ? ?Transition of Care (TOC) CM/SW Contact:    ?Trish Mage, LCSW ?Phone Number: ?03/30/2022, 8:20 AM ? ? ? ?[facility] of [facility]) has reviewed patient and no TOC needs have been identified at this time. We will continue to monitor patient advancement through interdisciplinary progression rounds. If new patient transition needs arise, please place a TOC consult. ? ? ?

## 2022-03-30 NOTE — Plan of Care (Signed)
?  Problem: Education: ?Goal: Knowledge of disease or condition will improve ?Outcome: Progressing ?Goal: Knowledge of the prescribed therapeutic regimen will improve ?Outcome: Progressing ?Goal: Individualized Educational Video(s) ?Outcome: Progressing ?  ?Problem: Activity: ?Goal: Ability to tolerate increased activity will improve ?Outcome: Progressing ?Goal: Will verbalize the importance of balancing activity with adequate rest periods ?Outcome: Progressing ?  ?Problem: Respiratory: ?Goal: Ability to maintain a clear airway will improve ?Outcome: Progressing ?Goal: Levels of oxygenation will improve ?Outcome: Progressing ?Goal: Ability to maintain adequate ventilation will improve ?Outcome: Progressing ?  ?Problem: Activity: ?Goal: Ability to tolerate increased activity will improve ?Outcome: Progressing ?  ?Problem: Clinical Measurements: ?Goal: Ability to maintain a body temperature in the normal range will improve ?Outcome: Progressing ?  ?Problem: Respiratory: ?Goal: Ability to maintain adequate ventilation will improve ?Outcome: Progressing ?Goal: Ability to maintain a clear airway will improve ?Outcome: Progressing ?  ?

## 2022-03-31 ENCOUNTER — Inpatient Hospital Stay: Payer: Medicare Other | Admitting: Dietician

## 2022-03-31 ENCOUNTER — Telehealth: Payer: Self-pay | Admitting: Cardiovascular Disease

## 2022-03-31 DIAGNOSIS — I4891 Unspecified atrial fibrillation: Secondary | ICD-10-CM | POA: Diagnosis not present

## 2022-03-31 DIAGNOSIS — C931 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: Secondary | ICD-10-CM | POA: Diagnosis not present

## 2022-03-31 DIAGNOSIS — J189 Pneumonia, unspecified organism: Secondary | ICD-10-CM | POA: Diagnosis not present

## 2022-03-31 DIAGNOSIS — I429 Cardiomyopathy, unspecified: Secondary | ICD-10-CM | POA: Diagnosis not present

## 2022-03-31 DIAGNOSIS — J90 Pleural effusion, not elsewhere classified: Secondary | ICD-10-CM | POA: Diagnosis not present

## 2022-03-31 DIAGNOSIS — I480 Paroxysmal atrial fibrillation: Secondary | ICD-10-CM | POA: Diagnosis not present

## 2022-03-31 LAB — RESPIRATORY PANEL BY PCR

## 2022-03-31 LAB — CBC
HCT: 21.8 % — ABNORMAL LOW (ref 36.0–46.0)
Hemoglobin: 7 g/dL — ABNORMAL LOW (ref 12.0–15.0)
MCH: 34.1 pg — ABNORMAL HIGH (ref 26.0–34.0)
MCHC: 32.1 g/dL (ref 30.0–36.0)
MCV: 106.3 fL — ABNORMAL HIGH (ref 80.0–100.0)
Platelets: 44 10*3/uL — ABNORMAL LOW (ref 150–400)
RBC: 2.05 MIL/uL — ABNORMAL LOW (ref 3.87–5.11)
RDW: 15.8 % — ABNORMAL HIGH (ref 11.5–15.5)
WBC: 0.8 10*3/uL — CL (ref 4.0–10.5)
nRBC: 0 % (ref 0.0–0.2)

## 2022-03-31 LAB — BASIC METABOLIC PANEL
Anion gap: 9 (ref 5–15)
BUN: 37 mg/dL — ABNORMAL HIGH (ref 8–23)
CO2: 21 mmol/L — ABNORMAL LOW (ref 22–32)
Calcium: 8 mg/dL — ABNORMAL LOW (ref 8.9–10.3)
Chloride: 103 mmol/L (ref 98–111)
Creatinine, Ser: 1.17 mg/dL — ABNORMAL HIGH (ref 0.44–1.00)
GFR, Estimated: 49 mL/min — ABNORMAL LOW (ref >60)
Glucose, Bld: 174 mg/dL — ABNORMAL HIGH (ref 70–99)
Potassium: 4.3 mmol/L (ref 3.5–5.1)
Sodium: 133 mmol/L — ABNORMAL LOW (ref 135–145)

## 2022-03-31 LAB — HEMOGLOBIN AND HEMATOCRIT, BLOOD
HCT: 27.8 % — ABNORMAL LOW (ref 36.0–46.0)
Hemoglobin: 9 g/dL — ABNORMAL LOW (ref 12.0–15.0)

## 2022-03-31 LAB — HEPARIN LEVEL (UNFRACTIONATED): Heparin Unfractionated: 0.42 IU/mL (ref 0.30–0.70)

## 2022-03-31 LAB — PREPARE RBC (CROSSMATCH)

## 2022-03-31 LAB — PROCALCITONIN: Procalcitonin: <0.1 ng/mL

## 2022-03-31 LAB — MAGNESIUM: Magnesium: 2 mg/dL (ref 1.7–2.4)

## 2022-03-31 MED ORDER — POLYETHYLENE GLYCOL 3350 17 G PO PACK
17.0000 g | PACK | Freq: Every day | ORAL | Status: DC | PRN
  Administered 2022-04-02 – 2022-04-03 (×3): 17 g via ORAL
  Filled 2022-03-31 (×3): qty 1

## 2022-03-31 MED ORDER — FUROSEMIDE 10 MG/ML IJ SOLN
20.0000 mg | Freq: Once | INTRAMUSCULAR | Status: AC
  Administered 2022-03-31: 20 mg via INTRAVENOUS
  Filled 2022-03-31: qty 2

## 2022-03-31 MED ORDER — METOPROLOL SUCCINATE ER 25 MG PO TB24
100.0000 mg | ORAL_TABLET | Freq: Every day | ORAL | Status: DC
  Administered 2022-03-31: 100 mg via ORAL
  Filled 2022-03-31 (×2): qty 4

## 2022-03-31 MED ORDER — DILTIAZEM HCL-DEXTROSE 125-5 MG/125ML-% IV SOLN (PREMIX)
5.0000 mg/h | INTRAVENOUS | Status: DC
  Administered 2022-03-31: 5 mg/h via INTRAVENOUS
  Administered 2022-04-01: 10 mg/h via INTRAVENOUS
  Administered 2022-04-01 – 2022-04-02 (×3): 12.5 mg/h via INTRAVENOUS
  Administered 2022-04-03: 15 mg/h via INTRAVENOUS
  Administered 2022-04-03: 7.5 mg/h via INTRAVENOUS
  Administered 2022-04-03 – 2022-04-04 (×2): 15 mg/h via INTRAVENOUS
  Administered 2022-04-04 – 2022-04-05 (×2): 5 mg/h via INTRAVENOUS
  Filled 2022-03-31 (×11): qty 125

## 2022-03-31 MED ORDER — SODIUM CHLORIDE 0.9% IV SOLUTION: Freq: Once | INTRAVENOUS | Status: AC

## 2022-03-31 MED ORDER — METHYLPREDNISOLONE SODIUM SUCC 40 MG IJ SOLR
40.0000 mg | Freq: Two times a day (BID) | INTRAMUSCULAR | Status: DC
  Administered 2022-03-31 – 2022-04-02 (×4): 40 mg via INTRAVENOUS
  Filled 2022-03-31 (×4): qty 1

## 2022-03-31 MED FILL — Dexamethasone Sodium Phosphate Inj 100 MG/10ML: INTRAMUSCULAR | Qty: 1 | Status: AC

## 2022-03-31 NOTE — Progress Notes (Addendum)
ANTICOAGULATION CONSULT NOTE - follow up ? ?Pharmacy Consult for Heparin ?Indication: atrial fibrillation ? ?Allergies  ?Allergen Reactions  ? Gluten Meal Diarrhea  ? Penicillins Rash and Other (See Comments)  ?  'Many years ago' ?  ? Alendronate Rash  ? Hydroxychloroquine Rash  ? ? ?Patient Measurements: ?Height: 5' 4"  (162.6 cm) ?Weight: 57.1 kg (125 lb 14.1 oz) ?IBW/kg (Calculated) : 54.7 ?Heparin Dosing Weight: 57.6 kg ? ?Vital Signs: ?Temp: 97.5 ?F (36.4 ?C) (04/07 0000) ?Temp Source: Oral (04/07 0000) ?BP: 95/38 (04/07 0100) ?Pulse Rate: 69 (04/07 0230) ? ?Labs: ?Recent Labs  ?  03/29/22 ?1403 03/29/22 ?1603 03/30/22 ?0240 03/30/22 ?9735 03/31/22 ?0245  ?HGB 9.4*  --  8.1*  --  7.0*  ?HCT 29.6*  --  25.7*  --  21.8*  ?PLT 49*  --  43*  --  44*  ?APTT 29  --  51* 59*  --   ?LABPROT 15.4*  --   --   --   --   ?INR 1.2  --   --   --   --   ?HEPARINUNFRC 0.27*  --  0.36 0.42 0.42  ?CREATININE 0.93  --  0.83  --  1.17*  ?TROPONINIHS 20* 18*  --   --   --   ? ? ? ?Estimated Creatinine Clearance: 35.9 mL/min (A) (by C-G formula based on SCr of 1.17 mg/dL (H)). ? ? ?Medical History: ?Past Medical History:  ?Diagnosis Date  ? Arthritis   ? Rheumatoid arthritis  ? Celiac disease   ? Chronic kidney disease   ? stage 3 from MD notes  ? COPD (chronic obstructive pulmonary disease) ([place])   ? Dyspnea   ? with going up stairs  ? Family history of adverse reaction to anesthesia   ? father had hard time waking up  ? Headache   ? sinus headaches  ? Hot flashes   ? Hypertension   ? Iron deficiency anemia   ? Pneumonia   ? per patient "I have walking pneumonia"  ? ? ?Medications:  ?Scheduled:  ? arformoterol  15 mcg Nebulization BID  ? And  ? umeclidinium bromide  1 puff Inhalation Daily  ? budesonide (PULMICORT) nebulizer solution  2 mg Nebulization Q12H  ? Chlorhexidine Gluconate Cloth  6 each Topical Daily  ? methylPREDNISolone (SOLU-MEDROL) injection  60 mg Intravenous Q12H  ? metoprolol succinate  75 mg Oral Daily  ? sodium  chloride flush  10-40 mL Intracatheter Q12H  ? ?Infusions:  ? sodium chloride 10 mL/hr at 03/31/22 0200  ? azithromycin Stopped (03/30/22 1829)  ? ceFEPime (MAXIPIME) IV 2 g (03/31/22 0241)  ? diltiazem (CARDIZEM) infusion 5 mg/hr (03/31/22 0240)  ? heparin 900 Units/hr (03/31/22 0200)  ? ?PRN:  ? 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  Has known hx afib on metoprolol and previously on Eliquis which was stopped on 4/3 due to pancytopenia and an episode of blood in the toilet bowl noted in 3/27 Oncology notes.  Pharmacy is consulted to dose IV heparin for afib. ? ?Last dose of Eliquis taken Monday 4/3 ? ?Plts 43 - improved from 22 on 3/27 when platelet transfusion given but down from 49k yestrday ? ?Baseline aPTT = 29, Heparin level = 0.27, PT/INR = 15.4/1.2 ? ?03/31/2022 ?Heparin level therapeutic on current IV heparin rate of 900  units/hr ?Hgb down to 7, plts 44 - cards aware, low threshold to stop heparin ?Per RN, no bleeding ? ?Goal of Therapy:  ?Heparin level 0.3-0.7 units/ml ?Monitor platelets by anticoagulation protocol: Yes ?  ?Plan:  ?Continue IV heparin drip at current rate of 900 units/hr ?Daily CBC and heparin level ? ? ?Dolly Rias RPh ?03/31/2022, 3:21 AM ? ?-Overnight NP concerned with low Hgb of 7, requested heparin to be stopped ?-f/u if to resume ? ?Dolly Rias RPh ?03/31/2022, 5:01 AM ? ? ?

## 2022-03-31 NOTE — Progress Notes (Addendum)
? ?Progress Note ? ?Patient Name: [name] ?Date of Encounter: 03/31/2022 ? ?[facility] Cardiologist: Mertie Moores, MD  ? ?Subjective  ? ?Patient is receiving a breathing treatment. Denies any chest pain, palpitations, dizziness, near syncope/syncope. Breathing has improved  ? ?Inpatient Medications  ?  ?Scheduled Meds: ? sodium chloride   Intravenous Once  ? arformoterol  15 mcg Nebulization BID  ? And  ? umeclidinium bromide  1 puff Inhalation Daily  ? budesonide (PULMICORT) nebulizer solution  2 mg Nebulization Q12H  ? Chlorhexidine Gluconate Cloth  6 each Topical Daily  ? methylPREDNISolone (SOLU-MEDROL) injection  60 mg Intravenous Q12H  ? metoprolol succinate  75 mg Oral Daily  ? sodium chloride flush  10-40 mL Intracatheter Q12H  ? ?Continuous Infusions: ? sodium chloride 10 mL/hr at 03/31/22 0200  ? azithromycin Stopped (03/30/22 1829)  ? ceFEPime (MAXIPIME) IV Stopped (03/31/22 0315)  ? diltiazem (CARDIZEM) infusion 5 mg/hr (03/31/22 0240)  ? ?PRN Meds: ?sodium chloride, acetaminophen **OR** acetaminophen, albuterol, ondansetron **OR** ondansetron (ZOFRAN) IV, sodium chloride flush, traZODone  ? ?Vital Signs  ?  ?Vitals:  ? 03/31/22 0515 03/31/22 0530 03/31/22 0600 03/31/22 0700  ?BP:   (!) 108/57 122/71  ?Pulse: (!) 120 (!) 113 72 (!) 46  ?Resp: 17 18 20 20   ?Temp:      ?TempSrc:      ?SpO2: 94% 95% 95% 95%  ?Weight:      ?Height:      ? ? ?Intake/Output Summary (Last 24 hours) at 03/31/2022 0737 ?Last data filed at 03/31/2022 0455 ?Gross per 24 hour  ?Intake 1284.61 ml  ?Output 675 ml  ?Net 609.61 ml  ? ? ?  03/31/2022  ?  5:00 AM 03/30/2022  ?  5:00 AM 03/29/2022  ?  6:45 PM  ?Last 3 Weights  ?Weight (lbs) 130 lb 8.2 oz 125 lb 14.1 oz 123 lb 3.8 oz  ?Weight (kg) 59.2 kg 57.1 kg 55.9 kg  ?   ? ?Telemetry  ?  ?Atrial fibrillation. HR is predominantly in the 90s-100s. Brief and infrequent elevations to the 120s - Personally Reviewed ? ?ECG  ?  ?No new tracings - Personally Reviewed ? ?Physical Exam   ? ?GEN: No acute distress.  Sitting up in bed receiving a breathing treatment  ?Neck: No JVD ?Cardiac: Irregular rate and rhythm, no murmurs, rubs, or gallops. Radial pulses 2+ bilaterally  ?Respiratory: Crackles in bilateral lung bases, more prominent in right. No increased WOB  ?GI: Soft, nontender, non-distended  ?MS: 2+ pitting edema bilaterally; No deformity. ?Neuro:  Nonfocal  ?Psych: Normal affect  ? ?Labs  ?  ?High Sensitivity Troponin:   ?Recent Labs  ?Lab 03/29/22 ?1403 03/29/22 ?1603  ?TROPONINIHS 20* 18*  ?   ?Chemistry ?Recent Labs  ?Lab 03/29/22 ?1403 03/30/22 ?3254 03/31/22 ?0245  ?NA 138 134* 133*  ?K 4.0 4.1 4.3  ?CL 107 105 103  ?CO2 23 20* 21*  ?GLUCOSE 126* 166* 174*  ?BUN 27* 27* 37*  ?CREATININE 0.93 0.83 1.17*  ?CALCIUM 9.2 8.4* 8.0*  ?MG  --  1.9 2.0  ?PROT 6.4* 5.8*  --   ?ALBUMIN 3.5 3.3*  --   ?AST 16 18  --   ?ALT 13 14  --   ?ALKPHOS 37* 34*  --   ?BILITOT 1.1 0.8  --   ?GFRNONAA >60 >60 49*  ?ANIONGAP 8 9 9   ?  ?Lipids No results for input(s): CHOL, TRIG, HDL, LABVLDL, LDLCALC, CHOLHDL in the last 168  hours.  ?Hematology ?Recent Labs  ?Lab 03/29/22 ?1403 03/30/22 ?1224 03/31/22 ?0245  ?WBC 0.9* 0.5* 0.8*  ?RBC 2.78* 2.38* 2.05*  ?HGB 9.4* 8.1* 7.0*  ?HCT 29.6* 25.7* 21.8*  ?MCV 106.5* 108.0* 106.3*  ?MCH 33.8 34.0 34.1*  ?MCHC 31.8 31.5 32.1  ?RDW 15.9* 16.2* 15.8*  ?PLT 49* 43* 44*  ? ?Thyroid  ?Recent Labs  ?Lab 03/29/22 ?1403  ?TSH 0.731  ?FREET4 1.47*  ?  ?BNP ?Recent Labs  ?Lab 03/29/22 ?1406  ?BNP 474.3*  ?  ?DDimer  ?Recent Labs  ?Lab 03/29/22 ?1403  ?DDIMER 2.36*  ?  ? ?Radiology  ?  ?DG Chest 1 View ? ?Result Date: 03/30/2022 ?CLINICAL DATA:  Status post thoracentesis EXAM: CHEST  1 VIEW COMPARISON:  03/29/2022 FINDINGS: Interval reduction in volume of a right pleural effusion, now minimal. No pneumothorax. Unchanged small left pleural effusion. Cardiomegaly. Right chest port catheter. Right shoulder reverse arthroplasty. IMPRESSION: Interval reduction in volume of a right  pleural effusion, now minimal. No pneumothorax. Unchanged small left pleural effusion. Electronically Signed   By: Delanna Ahmadi M.D.   On: 03/30/2022 12:45  ? ?CT Angio Chest PE W and/or Wo Contrast ? ?Result Date: 03/29/2022 ?CLINICAL DATA:  Tachycardia, shortness of breath. Pulmonary embolism (PE) suspected, high prob. History of chronic myelomonocytic leukemia EXAM: CT ANGIOGRAPHY CHEST WITH CONTRAST TECHNIQUE: Multidetector CT imaging of the chest was performed using the standard protocol during bolus administration of intravenous contrast. Multiplanar CT image reconstructions and MIPs were obtained to evaluate the vascular anatomy. RADIATION DOSE REDUCTION: This exam was performed according to the departmental dose-optimization program which includes automated exposure control, adjustment of the mA and/or kV according to patient size and/or use of iterative reconstruction technique. CONTRAST:  28m OMNIPAQUE IOHEXOL 350 MG/ML SOLN COMPARISON:  X-ray 03/29/2022, CT 12/23/2021 FINDINGS: Cardiovascular: Satisfactory opacification of the pulmonary arteries to the segmental level. No evidence of pulmonary embolism. Thoracic aorta is nonaneurysmal. Atherosclerotic calcifications of the aorta and coronary arteries. Mild cardiomegaly. No pericardial effusion. Right-sided chest port in place. Mediastinum/Nodes: No enlarged mediastinal, hilar, or axillary lymph nodes. Thyroid gland, trachea, and esophagus demonstrate no significant findings. Lungs/Pleura: Moderate right and trace left pleural effusions. Patchy airspace opacities within the right lower lobe and dependent portion of the left upper lobe. There is also mild left lower lobe opacity. No pneumothorax. Numerous right-sided pleural based masses, which have increased in size and number compared to the previous CT. Reference mass includes 2.7 x 1.0 cm mass overlying the posterior right tenth rib (series 4, image 105), previously 2.0 x 0.9 cm. Upper Abdomen: Stable  cyst in the anterior left hepatic lobe. No acute findings within the included upper abdomen. Musculoskeletal: Right-sided paraspinal soft tissue mass at the T10 level measures approximately 4.7 x 2.0 cm (series 4, image 103), previously 4.2 x 2.1 cm. No acute bony findings are seen. Review of the MIP images confirms the above findings. IMPRESSION: 1. No evidence of acute pulmonary embolism. 2. Moderate right and trace left pleural effusions with patchy airspace opacities within the right lower lobe and dependent portion of the left upper lobe, suspicious for multifocal pneumonia. 3. Numerous right-sided pleural based masses, which have increased in size and number compared to the previous CT. 4. Right-sided paraspinal soft tissue mass at the T10 level has slightly increased in size and number compared to the previous CT. 5. Aortic and coronary artery atherosclerosis (ICD10-I70.0). Electronically Signed   By: NDavina PokeD.O.   On: 03/29/2022 16:17  ? ?  DG Chest Portable 1 View ? ?Result Date: 03/29/2022 ?CLINICAL DATA:  Atrial fibrillation, shortness of breath EXAM: PORTABLE CHEST 1 VIEW COMPARISON:  08/31/2021 FINDINGS: Transverse diameter of heart is increased. Increased interstitial markings are seen in the parahilar regions and lower lung fields. There is blunting of both lateral CP angles. There is no pneumothorax. Tip of right IJ chest port is seen at the junction of superior vena cava and right atrium. There is previous right shoulder arthroplasty. IMPRESSION: Increased markings in the parahilar regions and lower lung fields suggest pulmonary edema or interstitial pneumonia. Small bilateral pleural effusions are seen. Electronically Signed   By: Elmer Picker M.D.   On: 03/29/2022 15:30  ? ?ECHOCARDIOGRAM COMPLETE ? ?Result Date: 03/30/2022 ?   ECHOCARDIOGRAM REPORT   Patient Name:   HADIA MINIER [facility] Date of Exam: 03/30/2022 Medical Rec #:  [id number]        Height:       64.0 in Accession #:    [id number]        Weight:       125.9 lb Date of Birth:  [birth year]       BSA:          1.607 m? Patient Age:    68 years         BP:           114/62 mmHg Patient Gender: F                HR:           105 bpm. Exam Locati

## 2022-03-31 NOTE — Progress Notes (Signed)
[name]   DOB:06-26-1946   JQ#:[id number]   WIO#:[id number] ? ?Heme-onc follow-up note ? ?Subjective: [name] underwent a right thoracentesis yesterday, her dyspnea slightly improved after procedure, but she still has dyspnea today, she has not gotten out of bed today, has not had BM for several days.  Her hemoglobin dropped to 7 this morning, no overt bleeding, she received blood transfusion this morning. ? ? ?Objective:  ?Vitals:  ? 03/31/22 1600 03/31/22 1630  ?BP: 124/83 121/70  ?Pulse: (!) 101   ?Resp: (!) 24 (!) 23  ?Temp: 97.9 ?F (36.6 ?C)   ?SpO2: 96%   ?  Body mass index is 22.4 kg/m?. ? ?Intake/Output Summary (Last 24 hours) at 03/31/2022 1707 ?Last data filed at 03/31/2022 1507 ?Gross per 24 hour  ?Intake 1213.86 ml  ?Output 875 ml  ?Net 338.86 ml  ? ? ? Sclerae unicteric ? Oropharynx clear ? No peripheral adenopathy ? Lungs slightly decreased breath sound on lung bases ? Heart regular rate and rhythm ? Abdomen benign ? MSK no focal spinal tenderness, no peripheral edema ? Neuro nonfocal ?  ? ?CBG (last 3)  ?Recent Labs  ?  03/29/22 ?2042  ?GLUCAP 151*  ? ? ? ?Labs:  ? ?Urine Studies ?No results for input(s): UHGB, CRYS in the last 72 hours. ? ?Invalid input(s): UACOL, UAPR, USPG, UPH, UTP, UGL, UKET, UBIL, UNIT, UROB, ULEU, UEPI, UWBC, URBC, UBAC, CAST, UCOM, BILUA ? ?Basic Metabolic Panel: ?Recent Labs  ?Lab 03/29/22 ?1403 03/30/22 ?4268 03/31/22 ?0245  ?NA 138 134* 133*  ?K 4.0 4.1 4.3  ?CL 107 105 103  ?CO2 23 20* 21*  ?GLUCOSE 126* 166* 174*  ?BUN 27* 27* 37*  ?CREATININE 0.93 0.83 1.17*  ?CALCIUM 9.2 8.4* 8.0*  ?MG  --  1.9 2.0  ? ?GFR ?Estimated Creatinine Clearance: 35.9 mL/min (A) (by C-G formula based on SCr of 1.17 mg/dL (H)). ?Liver Function Tests: ?Recent Labs  ?Lab 03/29/22 ?1403 03/30/22 ?3419  ?AST 16 18  ?ALT 13 14  ?ALKPHOS 37* 34*  ?BILITOT 1.1 0.8  ?PROT 6.4* 5.8*  ?ALBUMIN 3.5 3.3*  ? ?No results for input(s): LIPASE, AMYLASE in the last 168 hours. ?No results for input(s): AMMONIA  in the last 168 hours. ?Coagulation profile ?Recent Labs  ?Lab 03/29/22 ?1403  ?INR 1.2  ? ? ?CBC: ?Recent Labs  ?Lab 03/27/22 ?1153 03/29/22 ?1403 03/30/22 ?6222 03/31/22 ?0245 03/31/22 ?1111  ?WBC 0.9* 0.9* 0.5* 0.8*  --   ?NEUTROABS 0.3* 0.2*  --   --   --   ?HGB 8.4* 9.4* 8.1* 7.0* 9.0*  ?HCT 26.9* 29.6* 25.7* 21.8* 27.8*  ?MCV 106.7* 106.5* 108.0* 106.3*  --   ?PLT 41* 49* 43* 44*  --   ? ?Cardiac Enzymes: ?No results for input(s): CKTOTAL, CKMB, CKMBINDEX, TROPONINI in the last 168 hours. ?BNP: ?Invalid input(s): POCBNP ?CBG: ?Recent Labs  ?Lab 03/29/22 ?2042  ?GLUCAP 151*  ? ?D-Dimer ?Recent Labs  ?  03/29/22 ?1403  ?DDIMER 2.36*  ? ?Hgb A1c ?No results for input(s): HGBA1C in the last 72 hours. ?Lipid Profile ?No results for input(s): CHOL, HDL, LDLCALC, TRIG, CHOLHDL, LDLDIRECT in the last 72 hours. ?Thyroid function studies ?Recent Labs  ?  03/29/22 ?1403  ?TSH 0.731  ? ?Anemia work up ?No results for input(s): VITAMINB12, FOLATE, FERRITIN, TIBC, IRON, RETICCTPCT in the last 72 hours. ?Microbiology ?Recent Results (from the past 240 hour(s))  ?Resp Panel by RT-PCR (Flu A&B, Covid) Nasopharyngeal Swab     Status: None  ?  Collection Time: 03/29/22  5:38 PM  ? Specimen: Nasopharyngeal Swab; Nasopharyngeal(NP) swabs in vial transport medium  ?Result Value Ref Range Status  ? SARS Coronavirus 2 by RT PCR NEGATIVE NEGATIVE Final  ?  Comment: (NOTE) ?SARS-CoV-2 target nucleic acids are NOT DETECTED. ? ?The SARS-CoV-2 RNA is generally detectable in upper respiratory ?specimens during the acute phase of infection. The lowest ?concentration of SARS-CoV-2 viral copies this assay can detect is ?138 copies/mL. A negative result does not preclude SARS-Cov-2 ?infection and should not be used as the sole basis for treatment or ?other patient management decisions. A negative result may occur with  ?improper specimen collection/handling, submission of specimen other ?than nasopharyngeal swab, presence of viral mutation(s)  within the ?areas targeted by this assay, and inadequate number of viral ?copies(<138 copies/mL). A negative result must be combined with ?clinical observations, patient history, and epidemiological ?information. The expected result is Negative. ? ?Fact Sheet for Patients:  ?EntrepreneurPulse.com.au ? ?Fact Sheet for Healthcare Providers:  ?IncredibleEmployment.be ? ?This test is no t yet approved or cleared by the [place] FDA and  ?has been authorized for detection and/or diagnosis of SARS-CoV-2 by ?FDA under an Emergency Use Authorization (EUA). This EUA will remain  ?in effect (meaning this test can be used) for the duration of the ?COVID-19 declaration under Section 564(b)(1) of the Act, 21 ?U.S.C.section 360bbb-3(b)(1), unless the authorization is terminated  ?or revoked sooner.  ? ? ?  ? Influenza A by PCR NEGATIVE NEGATIVE Final  ? Influenza B by PCR NEGATIVE NEGATIVE Final  ?  Comment: (NOTE) ?The Xpert Xpress SARS-CoV-2/FLU/RSV plus assay is intended as an aid ?in the diagnosis of influenza from Nasopharyngeal swab specimens and ?should not be used as a sole basis for treatment. Nasal washings and ?aspirates are unacceptable for Xpert Xpress SARS-CoV-2/FLU/RSV ?testing. ? ?Fact Sheet for Patients: ?EntrepreneurPulse.com.au ? ?Fact Sheet for Healthcare Providers: ?IncredibleEmployment.be ? ?This test is not yet approved or cleared by the [place] FDA and ?has been authorized for detection and/or diagnosis of SARS-CoV-2 by ?FDA under an Emergency Use Authorization (EUA). This EUA will remain ?in effect (meaning this test can be used) for the duration of the ?COVID-19 declaration under Section 564(b)(1) of the Act, 21 U.S.C. ?section 360bbb-3(b)(1), unless the authorization is terminated or ?revoked. ? ?Performed at [facility], [address] ?[place], [place] [address] ?  ?MRSA Next Gen by PCR, Nasal      Status: None  ? Collection Time: 03/29/22  6:58 PM  ? Specimen: Nasal Mucosa; Nasal Swab  ?Result Value Ref Range Status  ? MRSA by PCR Next Gen NOT DETECTED NOT DETECTED Final  ?  Comment: (NOTE) ?The GeneXpert MRSA Assay (FDA approved for NASAL specimens only), ?is one component of a comprehensive MRSA colonization surveillance ?program. It is not intended to diagnose MRSA infection nor to guide ?or monitor treatment for MRSA infections. ?Test performance is not FDA approved in patients less than 2 years ?old. ?Performed at [facility], [address] ?[place],  [address] ?  ?Respiratory (~20 pathogens) panel by PCR     Status: None  ? Collection Time: 03/31/22  9:02 AM  ? Specimen: Nasopharyngeal Swab; Respiratory  ?Result Value Ref Range Status  ? Adenovirus NOT DETECTED NOT DETECTED Final  ? Coronavirus 229E NOT DETECTED NOT DETECTED Final  ?  Comment: (NOTE) ?The Coronavirus on the Respiratory Panel, DOES NOT test for the novel  ?Coronavirus (2019 nCoV) ?  ? Coronavirus HKU1  NOT DETECTED NOT DETECTED Final  ? Coronavirus NL63 NOT DETECTED NOT DETECTED Final  ? Coronavirus OC43 NOT DETECTED NOT DETECTED Final  ? Metapneumovirus NOT DETECTED NOT DETECTED Final  ? Rhinovirus / Enterovirus NOT DETECTED NOT DETECTED Final  ? Influenza A NOT DETECTED NOT DETECTED Final  ? Influenza B NOT DETECTED NOT DETECTED Final  ? Parainfluenza Virus 1 NOT DETECTED NOT DETECTED Final  ? Parainfluenza Virus 2 NOT DETECTED NOT DETECTED Final  ? Parainfluenza Virus 3 NOT DETECTED NOT DETECTED Final  ? Parainfluenza Virus 4 NOT DETECTED NOT DETECTED Final  ? Respiratory Syncytial Virus NOT DETECTED NOT DETECTED Final  ? Bordetella pertussis NOT DETECTED NOT DETECTED Final  ? Bordetella Parapertussis NOT DETECTED NOT DETECTED Final  ? Chlamydophila pneumoniae NOT DETECTED NOT DETECTED Final  ? Mycoplasma pneumoniae NOT DETECTED NOT DETECTED Final  ?  Comment: Performed at [facility], [place]  [address] [place], [place] [address]  ? ? ? ? ?Studies:  ?DG Chest 1 View ? ?Result Date: 03/30/2022 ?CLINICAL DATA:  Status post thoracentesis EXAM: CHEST  1 VIEW COMPARISON:  03/29/2022 FINDINGS: Interval reducti

## 2022-03-31 NOTE — Telephone Encounter (Signed)
[employer] is calling to let triage know patient has abnormal monitor results. ?

## 2022-03-31 NOTE — Progress Notes (Signed)
Patient scheduled for telephone visit today. Chart reviewed. Patient is currently admitted to hospital. Nutrition appointment cancelled. Noted patient with previously scheduled nutrition f/u  on 4/10.  ?

## 2022-03-31 NOTE — Progress Notes (Addendum)
Patient noted with drop in hgb from 8.1 to 7.0 without obvious source of bleeding. On review of her chart, she reported back on 03/20/2022 that she noted an episode of blood in the toilet bowl.  Hemoglobin was 7.7 and platelets were 22,000; therefore, she was transfused 1 unit of packed red blood cells and 1 unit of platelets. She underwent US thoracentesis yesterday yielding 570 cc of blood tinged amber fluid. She was on Heparin gtt for Afib as Eliquis was being held. Due to sudden drop with no obvious source, will hold Heparin gtt and transfuse with 1 unit prbcs. Recheck H&H post transfusion. ? ? ? ?Rufina Falco, DNP, CCRN, FNP-C, AGACNP-BC ?Acute Care Nurse Practitioner  ?[facility] ?Pager: [phone] ?[place] [facility] ? ?

## 2022-03-31 NOTE — Telephone Encounter (Signed)
Patient currently admitted and cardiology following. Will defer to rounding team. 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  ? ?Laurann Montana, NP

## 2022-03-31 NOTE — Progress Notes (Addendum)
?PROGRESS NOTE ? ? ? ?[name]  ZOX:[id number] DOB: 11-28-1946 DOA: 03/29/2022 ?PCP: [name], MD  ? ?Brief Narrative:  ?76 y.o. female with medical history significant of  CMML, currently on chemotherapy, pancytopenia, rheumatoid arthritis on Remicade, paroxysmal A-fib (off of Eliquis for the last 2 days prior to presentation?  Secondary to bleeding complications versus pancytopenia), hypertension, COPD (follows up with pulmonary as an outpatient), bilateral pulmonary nodules presented with worsening shortness of breath.  On presentation, she was found to be in A-fib with RVR and was started on IV Cardizem and heparin drips. CTA chest was negative for pulmonary embolism but showed bilateral pleural effusion, more on the right side with findings suggestive of multifocal pneumonia with numerous pleural-based right-sided masses, increased in size compared to previous CT. She was empirically started on IV antibiotics as per oncology recommendations.  She was put on IV steroids.  Cardiology was consulted.  She underwent ultrasound-guided right-sided thoracentesis on 03/30/2022 ? ?Assessment & Plan: ?  ?Paroxysmal A-fib with RVR ?-Presented with heart rates up to 160s.   ?-Still on Cardizem drip; continues to have intermittent tachycardia ?-Cardiology following.  Continue Toprol-XL ?-Heparin drip held earlier this morning because of anemia.  Eliquis was recently held for the last few days because of?  Pancytopenia/bleeding complications.  Monitor platelets ?  ?Dyspnea ?COPD with possible exacerbation ??  Possible bacterial multifocal pneumonia ?Bilateral pleural effusion, more on the right side ?Multiple pulmonary nodules ?-Patient has had progressively worsening dyspnea over the last few weeks/months.  Apparently did not tolerate brezteri as an outpatient and was switched back to Stiolto by pulmonary.   ?-Continue current inhaled regimen.  Decrease Solu-Medrol to 40 mg IV every 12 hours. ?-Currently on  cefepime and Zithromax.  Procalcitonin less than 0.1.  Unclear if her symptoms are because of bacterial pneumonia versus pleural effusion (?  Malignant in origin).  Check respiratory virus panel. ?-Status post ultrasound-guided thoracentesis on 03/30/2022 and removal of 570 cc.  Follow fluid analysis including cytology ?-BNP not significantly elevated.  Troponins have not trended up ? ?New onset cardiomyopathy ?Mild to moderate mitral valve regurgitation ?-Echo showed EF of 45 to 50% with mild to moderate mitral valve regurgitation.  Patient was given 1 dose of IV Lasix by cardiology on 03/30/2022.  Continue strict input and output.  Daily weights.  Fluid restriction.  Follow cardiology recommendations. ? ?CKD IIIa ?-Creatinine at baseline. Monitor ? ?  ?CMML, currently on chemotherapy ?Pancytopenia ?-Oncology following. ?-Still pancytopenic, platelets 44 today.  Hemoglobin 7 today.  Transfuse 1 unit packed red cells.  Monitor H&H.  Heparin drip plan as above. ? ?Hypertension ?-Blood pressure on the lower side.  Monitor.  Currently on Cardizem drip along with oral metoprolol. ?  ?History of rheumatoid arthritis ?-Outpatient follow-up with rheumatology.  Is on Remicade as an outpatient. ?  ?Bilateral lower extremity swelling, more on the left side ?-Lower extremity duplex was negative for DVT. ? ? ? ?DVT prophylaxis: Heparin drip discontinued in the morning of 03/31/2022 ?Code Status: Full ?Family Communication: Husband at bedside on 03/30/2022 ?Disposition Plan: ?Status is: Inpatient ?Remains inpatient appropriate because: Of severity of illness ? ? ? ?Consultants: Cardiology/oncology ? ?Procedures: Ultrasound-guided thoracentesis on 03/30/2022/echo ? ?Antimicrobials:  ?Anti-infectives (From admission, onward)  ? ? Start     Dose/Rate Route Frequency Ordered Stop  ? 03/30/22 1400  ceFEPIme (MAXIPIME) 2 g in sodium chloride 0.9 % 100 mL IVPB       ? 2 g ?200 mL/hr over 30  Minutes Intravenous Every 12 hours 03/30/22 1025    ?  03/30/22 0800  ceFEPIme (MAXIPIME) 2 g in sodium chloride 0.9 % 100 mL IVPB  Status:  Discontinued       ? 2 g ?200 mL/hr over 30 Minutes Intravenous Every 12 hours 03/29/22 1754 03/29/22 1757  ? 03/30/22 0200  ceFEPIme (MAXIPIME) 2 g in sodium chloride 0.9 % 100 mL IVPB  Status:  Discontinued       ? 2 g ?200 mL/hr over 30 Minutes Intravenous Every 8 hours 03/29/22 1757 03/30/22 1025  ? 03/29/22 1800  azithromycin (ZITHROMAX) 500 mg in sodium chloride 0.9 % 250 mL IVPB       ? 500 mg ?250 mL/hr over 60 Minutes Intravenous Every 24 hours 03/29/22 1747 04/03/22 1759  ? 03/29/22 1745  ceFEPIme (MAXIPIME) 2 g in sodium chloride 0.9 % 100 mL IVPB       ? 2 g ?200 mL/hr over 30 Minutes Intravenous  Once 03/29/22 1735 03/29/22 2132  ? ?  ? ? ? ?Subjective: ?Patient seen and examined at bedside.  No fever, vomiting, chest pain reported.  Still short of breath with minimal exertion. Slept better last night.  Still feels weak. ?Objective: ?Vitals:  ? 03/31/22 0515 03/31/22 0530 03/31/22 0600 03/31/22 0700  ?BP:   (!) 108/57 122/71  ?Pulse: (!) 120 (!) 113 72 (!) 46  ?Resp: 17 18 20 20   ?Temp:      ?TempSrc:      ?SpO2: 94% 95% 95% 95%  ?Weight:      ?Height:      ? ? ?Intake/Output Summary (Last 24 hours) at 03/31/2022 0753 ?Last data filed at 03/31/2022 0455 ?Gross per 24 hour  ?Intake 1284.61 ml  ?Output 675 ml  ?Net 609.61 ml  ? ? ?Filed Weights  ? 03/29/22 1845 03/30/22 0500 03/31/22 0500  ?Weight: 55.9 kg 57.1 kg 59.2 kg  ? ? ?Examination: ? ?General: On room air.  No distress.  Chronically ill and deconditioned looking. ?ENT/neck: No thyromegaly.  JVD is not elevated  ?respiratory: Decreased breath sounds at bases bilaterally with some crackles; no wheezing  ?CVS: S1-S2 heard, tachycardic  ?abdominal: Soft, nontender, slightly distended; no organomegaly, bowel sounds are heard  ?extremities: lower extremity edema present more on the left side; no cyanosis  ?CNS: Awake and alert.  No focal neurologic deficit.  Moves  extremities ?Lymph: No obvious lymphadenopathy ?Skin: No obvious petechiae/lesions  ?psych: Flat affect.  No signs of agitation currently.   ?Musculoskeletal: No obvious joint swelling/deformity ? ? ? ? ?Data Reviewed: I have personally reviewed following labs and imaging studies ? ?CBC: ?Recent Labs  ?Lab 03/27/22 ?1153 03/29/22 ?1403 03/30/22 ?9678 03/31/22 ?0245  ?WBC 0.9* 0.9* 0.5* 0.8*  ?NEUTROABS 0.3* 0.2*  --   --   ?HGB 8.4* 9.4* 8.1* 7.0*  ?HCT 26.9* 29.6* 25.7* 21.8*  ?MCV 106.7* 106.5* 108.0* 106.3*  ?PLT 41* 49* 43* 44*  ? ? ?Basic Metabolic Panel: ?Recent Labs  ?Lab 03/29/22 ?1403 03/30/22 ?9381 03/31/22 ?0245  ?NA 138 134* 133*  ?K 4.0 4.1 4.3  ?CL 107 105 103  ?CO2 23 20* 21*  ?GLUCOSE 126* 166* 174*  ?BUN 27* 27* 37*  ?CREATININE 0.93 0.83 1.17*  ?CALCIUM 9.2 8.4* 8.0*  ?MG  --  1.9 2.0  ? ? ?GFR: ?Estimated Creatinine Clearance: 35.9 mL/min (A) (by C-G formula based on SCr of 1.17 mg/dL (H)). ?Liver Function Tests: ?Recent Labs  ?Lab 03/29/22 ?1403 03/30/22 ?0175  ?  AST 16 18  ?ALT 13 14  ?ALKPHOS 37* 34*  ?BILITOT 1.1 0.8  ?PROT 6.4* 5.8*  ?ALBUMIN 3.5 3.3*  ? ? ?No results for input(s): LIPASE, AMYLASE in the last 168 hours. ?No results for input(s): AMMONIA in the last 168 hours. ?Coagulation Profile: ?Recent Labs  ?Lab 03/29/22 ?1403  ?INR 1.2  ? ? ?Cardiac Enzymes: ?No results for input(s): CKTOTAL, CKMB, CKMBINDEX, TROPONINI in the last 168 hours. ?BNP (last 3 results) ?No results for input(s): PROBNP in the last 8760 hours. ?HbA1C: ?No results for input(s): HGBA1C in the last 72 hours. ?CBG: ?Recent Labs  ?Lab 03/29/22 ?2042  ?GLUCAP 151*  ? ? ?Lipid Profile: ?No results for input(s): CHOL, HDL, LDLCALC, TRIG, CHOLHDL, LDLDIRECT in the last 72 hours. ?Thyroid Function Tests: ?Recent Labs  ?  03/29/22 ?1403  ?TSH 0.731  ?FREET4 1.47*  ? ? ?Anemia Panel: ?No results for input(s): VITAMINB12, FOLATE, FERRITIN, TIBC, IRON, RETICCTPCT in the last 72 hours. ?Sepsis Labs: ?Recent Labs  ?Lab  03/30/22 ?9381 03/31/22 ?0245  ?PROCALCITON <0.10 <0.10  ? ? ? ?Recent Results (from the past 240 hour(s))  ?Resp Panel by RT-PCR (Flu A&B, Covid) Nasopharyngeal Swab     Status: None  ? Collection Time: 03/29/22  5:38 PM

## 2022-03-31 NOTE — Telephone Encounter (Signed)
Called EOS reports. ?Pt wore 3 day monitor. ? ?Showed continuous AFib for 3 days, rates b/t 120 - 252 bpm ?One episode of VT, avg rate of 202.  ? ?Will forward note to Laurann Montana, NP & Dr. Acie Fredrickson for their [facility] and to be on look out for final report.  ?

## 2022-04-01 ENCOUNTER — Encounter (HOSPITAL_COMMUNITY): Payer: Self-pay | Admitting: Internal Medicine

## 2022-04-01 DIAGNOSIS — Z87898 Personal history of other specified conditions: Secondary | ICD-10-CM | POA: Diagnosis not present

## 2022-04-01 DIAGNOSIS — J90 Pleural effusion, not elsewhere classified: Secondary | ICD-10-CM | POA: Diagnosis not present

## 2022-04-01 DIAGNOSIS — C931 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: Secondary | ICD-10-CM | POA: Diagnosis not present

## 2022-04-01 DIAGNOSIS — I4891 Unspecified atrial fibrillation: Secondary | ICD-10-CM | POA: Diagnosis not present

## 2022-04-01 LAB — BASIC METABOLIC PANEL
Anion gap: 10 (ref 5–15)
BUN: 43 mg/dL — ABNORMAL HIGH (ref 8–23)
CO2: 19 mmol/L — ABNORMAL LOW (ref 22–32)
Calcium: 8.3 mg/dL — ABNORMAL LOW (ref 8.9–10.3)
Chloride: 106 mmol/L (ref 98–111)
Creatinine, Ser: 1.1 mg/dL — ABNORMAL HIGH (ref 0.44–1.00)
GFR, Estimated: 52 mL/min — ABNORMAL LOW (ref >60)
Glucose, Bld: 183 mg/dL — ABNORMAL HIGH (ref 70–99)
Potassium: 4 mmol/L (ref 3.5–5.1)
Sodium: 135 mmol/L (ref 135–145)

## 2022-04-01 LAB — CBC
HCT: 26.7 % — ABNORMAL LOW (ref 36.0–46.0)
Hemoglobin: 8.6 g/dL — ABNORMAL LOW (ref 12.0–15.0)
MCH: 33 pg (ref 26.0–34.0)
MCHC: 32.2 g/dL (ref 30.0–36.0)
MCV: 102.3 fL — ABNORMAL HIGH (ref 80.0–100.0)
Platelets: 65 10*3/uL — ABNORMAL LOW (ref 150–400)
RBC: 2.61 MIL/uL — ABNORMAL LOW (ref 3.87–5.11)
RDW: 17.9 % — ABNORMAL HIGH (ref 11.5–15.5)
WBC: 1.1 10*3/uL — CL (ref 4.0–10.5)
nRBC: 0 % (ref 0.0–0.2)

## 2022-04-01 LAB — HEPARIN LEVEL (UNFRACTIONATED): Heparin Unfractionated: <0.1 IU/mL — ABNORMAL LOW (ref 0.30–0.70)

## 2022-04-01 LAB — MAGNESIUM: Magnesium: 2.3 mg/dL (ref 1.7–2.4)

## 2022-04-01 MED ORDER — METOPROLOL SUCCINATE ER 25 MG PO TB24
100.0000 mg | ORAL_TABLET | Freq: Two times a day (BID) | ORAL | Status: DC
  Administered 2022-04-01 – 2022-04-03 (×4): 100 mg via ORAL
  Filled 2022-04-01 (×4): qty 4

## 2022-04-01 MED ORDER — TRAZODONE HCL 100 MG PO TABS
100.0000 mg | ORAL_TABLET | Freq: Every evening | ORAL | Status: DC | PRN
  Administered 2022-04-01 – 2022-04-11 (×7): 100 mg via ORAL
  Filled 2022-04-01: qty 1
  Filled 2022-04-01: qty 2
  Filled 2022-04-01: qty 1
  Filled 2022-04-01: qty 2
  Filled 2022-04-01 (×2): qty 1
  Filled 2022-04-01: qty 2

## 2022-04-01 NOTE — Progress Notes (Signed)
? ?Progress Note ? ?Patient Name: [name] ?Date of Encounter: 04/01/2022 ? ?[facility] Cardiologist: Mertie Moores, MD  ? ?Subjective  ? ?Feeling better with breathing improved.  Remains in atrial fibrillation with rapid rates. ? ? ?Inpatient Medications  ?  ?Scheduled Meds: ? arformoterol  15 mcg Nebulization BID  ? And  ? umeclidinium bromide  1 puff Inhalation Daily  ? budesonide (PULMICORT) nebulizer solution  2 mg Nebulization Q12H  ? Chlorhexidine Gluconate Cloth  6 each Topical Daily  ? methylPREDNISolone (SOLU-MEDROL) injection  40 mg Intravenous Q12H  ? metoprolol succinate  100 mg Oral BID  ? sodium chloride flush  10-40 mL Intracatheter Q12H  ? ?Continuous Infusions: ? sodium chloride 10 mL/hr at 04/01/22 0800  ? azithromycin Stopped (03/31/22 1825)  ? ceFEPime (MAXIPIME) IV Stopped (04/01/22 0324)  ? diltiazem (CARDIZEM) infusion 10 mg/hr (04/01/22 0800)  ? ?PRN Meds: ?sodium chloride, acetaminophen **OR** acetaminophen, albuterol, ondansetron **OR** ondansetron (ZOFRAN) IV, polyethylene glycol, sodium chloride flush, traZODone  ? ?Vital Signs  ?  ?Vitals:  ? 04/01/22 0730 04/01/22 0734 04/01/22 0800 04/01/22 0830  ?BP: 125/80  121/74 (!) 121/59  ?Pulse: 66  (!) 127 98  ?Resp: (!) 25  (!) 25 19  ?Temp:   97.7 ?F (36.5 ?C)   ?TempSrc:   Axillary   ?SpO2: 97% 99% 97% 97%  ?Weight:      ?Height:      ? ? ?Intake/Output Summary (Last 24 hours) at 04/01/2022 1007 ?Last data filed at 04/01/2022 0800 ?Gross per 24 hour  ?Intake 843.69 ml  ?Output 1000 ml  ?Net -156.31 ml  ? ? ? ?  04/01/2022  ?  5:00 AM 03/31/2022  ?  5:00 AM 03/30/2022  ?  5:00 AM  ?Last 3 Weights  ?Weight (lbs) 134 lb 14.7 oz 130 lb 8.2 oz 125 lb 14.1 oz  ?Weight (kg) 61.2 kg 59.2 kg 57.1 kg  ?   ? ?Telemetry  ?  ?Atrial fibrillation-personally reviewed ? ?ECG  ?  ?None new ? ?Physical Exam  ? ?GEN: Well nourished, well developed, in no acute distress  ?HEENT: normal  ?Neck: no JVD, carotid bruits, or masses ?Cardiac: Irregular tachycardic;  no murmurs, rubs, or gallops, + edema  ?Respiratory:  clear to auscultation bilaterally, normal work of breathing ?GI: soft, nontender, nondistended, + BS ?MS: no deformity or atrophy  ?Skin: warm and dry ?Neuro:  Strength and sensation are intact ?Psych: euthymic mood, full affect  ? ? ?Labs  ?  ?High Sensitivity Troponin:   ?Recent Labs  ?Lab 03/29/22 ?1403 03/29/22 ?1603  ?TROPONINIHS 20* 18*  ? ?   ?Chemistry ?Recent Labs  ?Lab 03/29/22 ?1403 03/30/22 ?5462 03/31/22 ?0245 04/01/22 ?0500  ?NA 138 134* 133* 135  ?K 4.0 4.1 4.3 4.0  ?CL 107 105 103 106  ?CO2 23 20* 21* 19*  ?GLUCOSE 126* 166* 174* 183*  ?BUN 27* 27* 37* 43*  ?CREATININE 0.93 0.83 1.17* 1.10*  ?CALCIUM 9.2 8.4* 8.0* 8.3*  ?MG  --  1.9 2.0 2.3  ?PROT 6.4* 5.8*  --   --   ?ALBUMIN 3.5 3.3*  --   --   ?AST 16 18  --   --   ?ALT 13 14  --   --   ?ALKPHOS 37* 34*  --   --   ?BILITOT 1.1 0.8  --   --   ?GFRNONAA >60 >60 49* 52*  ?ANIONGAP 8 9 9 10   ? ?  ?Lipids No  results for input(s): CHOL, TRIG, HDL, LABVLDL, LDLCALC, CHOLHDL in the last 168 hours.  ?Hematology ?Recent Labs  ?Lab 03/30/22 ?7253 03/31/22 ?0245 03/31/22 ?1111 04/01/22 ?0500  ?WBC 0.5* 0.8*  --  1.1*  ?RBC 2.38* 2.05*  --  2.61*  ?HGB 8.1* 7.0* 9.0* 8.6*  ?HCT 25.7* 21.8* 27.8* 26.7*  ?MCV 108.0* 106.3*  --  102.3*  ?MCH 34.0 34.1*  --  33.0  ?MCHC 31.5 32.1  --  32.2  ?RDW 16.2* 15.8*  --  17.9*  ?PLT 43* 44*  --  65*  ? ? ?Thyroid  ?Recent Labs  ?Lab 03/29/22 ?1403  ?TSH 0.731  ?FREET4 1.47*  ? ?  ?BNP ?Recent Labs  ?Lab 03/29/22 ?1406  ?BNP 474.3*  ? ?  ?DDimer  ?Recent Labs  ?Lab 03/29/22 ?1403  ?DDIMER 2.36*  ? ?  ? ?Radiology  ?  ?DG Chest 1 View ? ?Result Date: 03/30/2022 ?CLINICAL DATA:  Status post thoracentesis EXAM: CHEST  1 VIEW COMPARISON:  03/29/2022 FINDINGS: Interval reduction in volume of a right pleural effusion, now minimal. No pneumothorax. Unchanged small left pleural effusion. Cardiomegaly. Right chest port catheter. Right shoulder reverse arthroplasty. IMPRESSION:  Interval reduction in volume of a right pleural effusion, now minimal. No pneumothorax. Unchanged small left pleural effusion. Electronically Signed   By: Delanna Ahmadi M.D.   On: 03/30/2022 12:45  ? ?ECHOCARDIOGRAM COMPLETE ? ?Result Date: 03/30/2022 ?   ECHOCARDIOGRAM REPORT   Patient Name:   [name] [facility] Date of Exam: 03/30/2022 Medical Rec #:  [id number]        Height:       64.0 in Accession #:    [id number]       Weight:       125.9 lb Date of Birth:  1946/08/12       BSA:          1.607 m? Patient Age:    76 years         BP:           114/62 mmHg Patient Gender: F                HR:           105 bpm. Exam Location:  Inpatient Procedure: 2D Echo, Cardiac Doppler and Color Doppler Indications:    Afib  History:        Patient has prior history of Echocardiogram examinations, most                 recent 10/27/2021. COPD; Arrythmias:Atrial Fibrillation.  Sonographer:    Jyl Heinz Referring Phys: [id number] [place]  1. Left ventricular ejection fraction, by estimation, is 45 to 50%. Left ventricular ejection fraction by 2D MOD biplane is 48.0 %. The left ventricle has mildly decreased function. The left ventricle demonstrates global hypokinesis. Left ventricular diastolic function could not be evaluated.  2. Right ventricular systolic function is mildly reduced. The right ventricular size is normal. There is mildly elevated pulmonary artery systolic pressure. The estimated right ventricular systolic pressure is 88.4 mmHg.  3. The mitral valve is abnormal. Mild to moderate mitral valve regurgitation.  4. The aortic valve is tricuspid. Aortic valve regurgitation is not visualized. Aortic valve sclerosis is present, with no evidence of aortic valve stenosis.  5. The inferior vena cava is dilated in size with <50% respiratory variability, suggesting right atrial pressure of 15 mmHg. Comparison(s): Changes from prior study are noted. 10/27/2021: LVEF 60-65%. FINDINGS  Left Ventricle: Left  ventricular ejection fraction, by estimation, is 45 to 50%. Left ventricular ejection fraction by 2D MOD biplane is 48.0 %. The left ventricle has mildly decreased function. The left ventricle demonstrates global hypokinesis. The left ventricular internal cavity size was normal in size. There is no left ventricular hypertrophy. Left ventricular diastolic function could not be evaluated due to atrial fibrillation. Left ventricular diastolic function could not be evaluated. Right Ventricle: The right ventricular size is normal. No increase in right ventricular wall thickness. Right ventricular systolic function is mildly reduced. There is mildly elevated pulmonary artery systolic pressure. The tricuspid regurgitant velocity  is 2.41 m/s, and with an assumed right atrial pressure of 15 mmHg, the estimated right ventricular systolic pressure is 21.9 mmHg. Left Atrium: Left atrial size was normal in size. Right Atrium: Right atrial size was normal in size. Pericardium: There is no evidence of pericardial effusion. Mitral Valve: The mitral valve is abnormal. There is mild thickening of the anterior and posterior mitral valve leaflet(s). Mild to moderate mitral valve regurgitation, with posteriorly-directed jet. Tricuspid Valve: The tricuspid valve is grossly normal. Tricuspid valve regurgitation is mild. Aortic Valve: The aortic valve is tricuspid. Aortic valve regurgitation is not visualized. Aortic valve sclerosis is present, with no evidence of aortic valve stenosis. Aortic valve peak gradient measures 6.8 mmHg. Pulmonic Valve: The pulmonic valve was grossly normal. Pulmonic valve regurgitation is trivial. Aorta: The aortic root and ascending aorta are structurally normal, with no evidence of dilitation. Venous: The inferior vena cava is dilated in size with less than 50% respiratory variability, suggesting right atrial pressure of 15 mmHg. IAS/Shunts: No atrial level shunt detected by color flow Doppler.  LEFT  VENTRICLE PLAX 2D                        Biplane EF (MOD) LVIDd:         4.20 cm         LV Biplane EF:   Left LVIDs:         3.20 cm                          ventricular LV PW:         0.90 cm

## 2022-04-01 NOTE — Progress Notes (Signed)
Pharmacy Antibiotic Note ? ?[name] is a 76 y.o. female admitted on 03/29/2022 with febrile neutropenia.  Pharmacy was consulted for Cefepime dosing. ? ?Plan: ?Continue cefepime 2 g IV every 12 hours ?Monitor clinical progress, renal function, absolute neutrophil count, LOT ? ? ?Height: 5' 4"  (162.6 cm) ?Weight: 61.2 kg (134 lb 14.7 oz) ?IBW/kg (Calculated) : 54.7 ? ?Temp (24hrs), Avg:97.6 ?F (36.4 ?C), Min:97.2 ?F (36.2 ?C), Max:97.9 ?F (36.6 ?C) ? ?Recent Labs  ?Lab 03/27/22 ?1153 03/29/22 ?1403 03/30/22 ?1696 03/31/22 ?0245 04/01/22 ?0500  ?WBC 0.9* 0.9* 0.5* 0.8* 1.1*  ?CREATININE  --  0.93 0.83 1.17* 1.10*  ? ?  ?Estimated Creatinine Clearance: 38.2 mL/min (A) (by C-G formula based on SCr of 1.1 mg/dL (H)).   ? ?Allergies  ?Allergen Reactions  ? Gluten Meal Diarrhea  ? Penicillins Rash and Other (See Comments)  ?  'Many years ago' ?  ? Alendronate Rash  ? Hydroxychloroquine Rash  ? ? ?Antimicrobials this admission: ?4/5 Cefepime >> ? ?Dose adjustments this admission: ?4/6 cefepime 2 g IV q8h changed to q12h ? ?Microbiology results: ?N/A ? ?Thank you for allowing pharmacy to be a part of this patient?s care. ? ?Royetta Asal, PharmD, BCPS ?Clinical Pharmacist ?[place] ?Please utilize Amion for appropriate phone number to reach the unit pharmacist ([place]) ?04/01/2022 8:39 AM ? ? ?

## 2022-04-01 NOTE — Progress Notes (Signed)
?PROGRESS NOTE ? ? ? ?[name]  KCM:[id number] DOB: [birth year] DOA: 03/29/2022 ?PCP: [name], MD  ? ?Brief Narrative:  ?76 y.o. female with medical history significant of  CMML, currently on chemotherapy, pancytopenia, rheumatoid arthritis on Remicade, paroxysmal A-fib (off of Eliquis for the last 2 days prior to presentation?  Secondary to bleeding complications versus pancytopenia), hypertension, COPD (follows up with pulmonary as an outpatient), bilateral pulmonary nodules presented with worsening shortness of breath.  On presentation, she was found to be in A-fib with RVR and was started on IV Cardizem and heparin drips. CTA chest was negative for pulmonary embolism but showed bilateral pleural effusion, more on the right side with findings suggestive of multifocal pneumonia with numerous pleural-based right-sided masses, increased in size compared to previous CT. She was empirically started on IV antibiotics as per oncology recommendations.  She was put on IV steroids.  Cardiology was consulted.  She underwent ultrasound-guided right-sided thoracentesis on 03/30/2022.  Heparin drip was also added on 03/31/2022 due to drop in hemoglobin.  She was transfused 1 unit packed red cells on 03/31/2022. ? ?Assessment & Plan: ?  ?Paroxysmal A-fib with RVR ?-Presented with heart rates up to 160s.   ?-continues to have intermittent tachycardia.  Cardizem drip has been restarted. ?-Cardiology following.  Continue Toprol-XL ?-Heparin drip on hold since 03/31/2022 because drop in hemoglobin.  Eliquis was recently held for the last few days because of?  Pancytopenia/bleeding complications.  Monitor platelets.  Will not resume anticoagulation till cleared by oncology/cardiology ?  ?Dyspnea ?COPD with possible exacerbation ??  Possible bacterial multifocal pneumonia ?Bilateral pleural effusion, more on the right side ?Multiple pulmonary nodules ?-Patient has had progressively worsening dyspnea over the last few  weeks/months.  Apparently did not tolerate brezteri as an outpatient and was switched back to Stiolto by pulmonary.   ?-Continue current inhaled regimen.  Continue Solu-Medrol 40 mg IV every 12 hours. ?-Currently on cefepime and Zithromax.  Procalcitonin less than 0.1.  Unclear if her symptoms are because of bacterial pneumonia versus pleural effusion (?  Malignant in origin).  Respiratory virus panel negative. ?-Status post ultrasound-guided thoracentesis on 03/30/2022 and removal of 570 cc.  Follow fluid analysis including cytology ?-BNP not significantly elevated.  Troponins have not trended up ? ?New onset cardiomyopathy ?Mild to moderate mitral valve regurgitation ?-Echo showed EF of 45 to 50% with mild to moderate mitral valve regurgitation.  Continue strict input and output.  Daily weights.  Fluid restriction.  Follow cardiology recommendations.  Patient received another dose of Lasix on 03/31/2022 as per cardiology. ? ?CKD IIIa ?-Creatinine at baseline. Monitor ? ?Hyponatremia ?-Mild, improved ?  ?CMML, currently on chemotherapy ?Pancytopenia ?-Oncology following. ?-Still pancytopenic, platelets 65 today.  Status post 1 unit packed red cell transfusion on 03/31/2022.  Hemoglobin 8.6 this morning.  Monitor H&H.   ? ?Hypertension ?-Blood pressure currently stable.  Monitor.  Currently on Cardizem drip along with oral metoprolol. ?  ?History of rheumatoid arthritis ?-Outpatient follow-up with rheumatology.  Is on Remicade as an outpatient. ?  ?Bilateral lower extremity swelling, more on the left side ?-Lower extremity duplex was negative for DVT. ? ? ? ?DVT prophylaxis: Heparin drip discontinued in the morning of 03/31/2022 ?Code Status: Full ?Family Communication: Husband at bedside on 03/30/2022 ?Disposition Plan: ?Status is: Inpatient ?Remains inpatient appropriate because: Of severity of illness ? ? ? ?Consultants: Cardiology/oncology ? ?Procedures: Ultrasound-guided thoracentesis on 03/30/2022/echo ? ?Antimicrobials:   ?Anti-infectives (From admission, onward)  ? ? Start  Dose/Rate Route Frequency Ordered Stop  ? 03/30/22 1400  ceFEPIme (MAXIPIME) 2 g in sodium chloride 0.9 % 100 mL IVPB       ? 2 g ?200 mL/hr over 30 Minutes Intravenous Every 12 hours 03/30/22 1025    ? 03/30/22 0800  ceFEPIme (MAXIPIME) 2 g in sodium chloride 0.9 % 100 mL IVPB  Status:  Discontinued       ? 2 g ?200 mL/hr over 30 Minutes Intravenous Every 12 hours 03/29/22 1754 03/29/22 1757  ? 03/30/22 0200  ceFEPIme (MAXIPIME) 2 g in sodium chloride 0.9 % 100 mL IVPB  Status:  Discontinued       ? 2 g ?200 mL/hr over 30 Minutes Intravenous Every 8 hours 03/29/22 1757 03/30/22 1025  ? 03/29/22 1800  azithromycin (ZITHROMAX) 500 mg in sodium chloride 0.9 % 250 mL IVPB       ? 500 mg ?250 mL/hr over 60 Minutes Intravenous Every 24 hours 03/29/22 1747 04/03/22 1759  ? 03/29/22 1745  ceFEPIme (MAXIPIME) 2 g in sodium chloride 0.9 % 100 mL IVPB       ? 2 g ?200 mL/hr over 30 Minutes Intravenous  Once 03/29/22 1735 03/29/22 2132  ? ?  ? ? ? ?Subjective: ?Patient seen and examined at bedside.  No overnight vomiting, fever, worsening chest pain or diarrhea reported.  Still short of breath with exertion.  Did not get much sleep last night as well ? ?Objective: ?Vitals:  ? 04/01/22 0600 04/01/22 0700 04/01/22 0730 04/01/22 0734  ?BP: 118/76 114/76 125/80   ?Pulse: 75  66   ?Resp: (!) 21 (!) 21 (!) 25   ?Temp:      ?TempSrc:      ?SpO2: 96% 96% 97% 99%  ?Weight:      ?Height:      ? ? ?Intake/Output Summary (Last 24 hours) at 04/01/2022 0744 ?Last data filed at 04/01/2022 0700 ?Gross per 24 hour  ?Intake 1349.52 ml  ?Output 1000 ml  ?Net 349.52 ml  ? ? ?Filed Weights  ? 03/30/22 0500 03/31/22 0500 04/01/22 0500  ?Weight: 57.1 kg 59.2 kg 61.2 kg  ? ? ?Examination: ? ?General: No acute distress.  On room air currently.  Chronically ill and deconditioned looking. ?ENT/neck: JVD not elevated.  No palpable thyromegaly ?respiratory: Bilateral decreased breath sounds at bases  with scattered crackles and intermittent tachypnea  ?CVS: Mild intermittent tachycardia present; S1-S2 heard  ?abdominal: Soft, nontender, distended mildly, no organomegaly, normal bowel sounds heard ?extremities: No clubbing; trace lower extremity edema present, more on the left side ?CNS: Alert and oriented.  Slow to respond.  Poor historian.  No focal neurologic deficit.  Moving extremities ?Lymph: No palpable lymphadenopathy ?Skin: No obvious ecchymosis/rashes ?psych: Affect is flat.  Currently not agitated  ?musculoskeletal: No obvious joint tenderness/swelling ? ? ? ?Data Reviewed: I have personally reviewed following labs and imaging studies ? ?CBC: ?Recent Labs  ?Lab 03/27/22 ?1153 03/29/22 ?1403 03/30/22 ?0539 03/31/22 ?0245 03/31/22 ?1111 04/01/22 ?0500  ?WBC 0.9* 0.9* 0.5* 0.8*  --  1.1*  ?NEUTROABS 0.3* 0.2*  --   --   --   --   ?HGB 8.4* 9.4* 8.1* 7.0* 9.0* 8.6*  ?HCT 26.9* 29.6* 25.7* 21.8* 27.8* 26.7*  ?MCV 106.7* 106.5* 108.0* 106.3*  --  102.3*  ?PLT 41* 49* 43* 44*  --  65*  ? ? ?Basic Metabolic Panel: ?Recent Labs  ?Lab 03/29/22 ?1403 03/30/22 ?7673 03/31/22 ?0245 04/01/22 ?0500  ?NA 138 134* 133*  135  ?K 4.0 4.1 4.3 4.0  ?CL 107 105 103 106  ?CO2 23 20* 21* 19*  ?GLUCOSE 126* 166* 174* 183*  ?BUN 27* 27* 37* 43*  ?CREATININE 0.93 0.83 1.17* 1.10*  ?CALCIUM 9.2 8.4* 8.0* 8.3*  ?MG  --  1.9 2.0 2.3  ? ? ?GFR: ?Estimated Creatinine Clearance: 38.2 mL/min (A) (by C-G formula based on SCr of 1.1 mg/dL (H)). ?Liver Function Tests: ?Recent Labs  ?Lab 03/29/22 ?1403 03/30/22 ?9485  ?AST 16 18  ?ALT 13 14  ?ALKPHOS 37* 34*  ?BILITOT 1.1 0.8  ?PROT 6.4* 5.8*  ?ALBUMIN 3.5 3.3*  ? ? ?No results for input(s): LIPASE, AMYLASE in the last 168 hours. ?No results for input(s): AMMONIA in the last 168 hours. ?Coagulation Profile: ?Recent Labs  ?Lab 03/29/22 ?1403  ?INR 1.2  ? ? ?Cardiac Enzymes: ?No results for input(s): CKTOTAL, CKMB, CKMBINDEX, TROPONINI in the last 168 hours. ?BNP (last 3 results) ?No results  for input(s): PROBNP in the last 8760 hours. ?HbA1C: ?No results for input(s): HGBA1C in the last 72 hours. ?CBG: ?Recent Labs  ?Lab 03/29/22 ?2042  ?GLUCAP 151*  ? ? ?Lipid Profile: ?No results for input(s): CHOL

## 2022-04-02 DIAGNOSIS — J90 Pleural effusion, not elsewhere classified: Secondary | ICD-10-CM | POA: Diagnosis not present

## 2022-04-02 DIAGNOSIS — Z87898 Personal history of other specified conditions: Secondary | ICD-10-CM | POA: Diagnosis not present

## 2022-04-02 DIAGNOSIS — I4891 Unspecified atrial fibrillation: Secondary | ICD-10-CM | POA: Diagnosis not present

## 2022-04-02 DIAGNOSIS — C931 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: Secondary | ICD-10-CM | POA: Diagnosis not present

## 2022-04-02 LAB — BASIC METABOLIC PANEL
Anion gap: 8 (ref 5–15)
BUN: 49 mg/dL — ABNORMAL HIGH (ref 8–23)
CO2: 21 mmol/L — ABNORMAL LOW (ref 22–32)
Calcium: 8.1 mg/dL — ABNORMAL LOW (ref 8.9–10.3)
Chloride: 105 mmol/L (ref 98–111)
Creatinine, Ser: 1.09 mg/dL — ABNORMAL HIGH (ref 0.44–1.00)
GFR, Estimated: 53 mL/min — ABNORMAL LOW (ref >60)
Glucose, Bld: 182 mg/dL — ABNORMAL HIGH (ref 70–99)
Potassium: 4.2 mmol/L (ref 3.5–5.1)
Sodium: 134 mmol/L — ABNORMAL LOW (ref 135–145)

## 2022-04-02 LAB — BPAM RBC
Blood Product Expiration Date: 202304282359
ISSUE DATE / TIME: 202304070826
Unit Type and Rh: 7300

## 2022-04-02 LAB — TYPE AND SCREEN
ABO/RH(D): B POS
Antibody Screen: NEGATIVE
Unit division: 0

## 2022-04-02 LAB — CBC
HCT: 26.1 % — ABNORMAL LOW (ref 36.0–46.0)
Hemoglobin: 8.2 g/dL — ABNORMAL LOW (ref 12.0–15.0)
MCH: 32.7 pg (ref 26.0–34.0)
MCHC: 31.4 g/dL (ref 30.0–36.0)
MCV: 104 fL — ABNORMAL HIGH (ref 80.0–100.0)
Platelets: 81 10*3/uL — ABNORMAL LOW (ref 150–400)
RBC: 2.51 MIL/uL — ABNORMAL LOW (ref 3.87–5.11)
RDW: 17.4 % — ABNORMAL HIGH (ref 11.5–15.5)
WBC: 1 10*3/uL — CL (ref 4.0–10.5)
nRBC: 1.9 % — ABNORMAL HIGH (ref 0.0–0.2)

## 2022-04-02 LAB — MAGNESIUM: Magnesium: 2.4 mg/dL (ref 1.7–2.4)

## 2022-04-02 MED ORDER — FUROSEMIDE 10 MG/ML IJ SOLN
40.0000 mg | Freq: Once | INTRAMUSCULAR | Status: AC
  Administered 2022-04-02: 40 mg via INTRAVENOUS
  Filled 2022-04-02: qty 4

## 2022-04-02 MED ORDER — SENNOSIDES-DOCUSATE SODIUM 8.6-50 MG PO TABS
1.0000 | ORAL_TABLET | Freq: Two times a day (BID) | ORAL | Status: DC
  Administered 2022-04-02 – 2022-04-12 (×20): 1 via ORAL
  Filled 2022-04-02 (×23): qty 1

## 2022-04-02 MED ORDER — METHYLPREDNISOLONE SODIUM SUCC 40 MG IJ SOLR
40.0000 mg | INTRAMUSCULAR | Status: DC
  Administered 2022-04-03 – 2022-04-04 (×2): 40 mg via INTRAVENOUS
  Filled 2022-04-02 (×2): qty 1

## 2022-04-02 NOTE — Evaluation (Addendum)
Physical Therapy Evaluation ?Patient Details ?Name: [name] ?MRN: [id number] ?DOB: 1946-07-18 ?Today's Date: 04/02/2022 ? ?History of Present Illness ? y.o. female with medical history significant of  CMML, currently on chemotherapy, pancytopenia, rheumatoid arthritis on Remicade, paroxysmal A-fib (off of Eliquis for the last 2 days prior to presentation?  Secondary to bleeding complications versus pancytopenia), hypertension, COPD (follows up with pulmonary as an outpatient), bilateral pulmonary nodules presented with worsening shortness of breath. Found to be in A-fib with RVR.. CTA chest was negative for pulmonary embolism but showed bilateral pleural effusion, more on the right side with findings suggestive of multifocal pneumonia with numerous pleural-based right-sided masses  ?Clinical Impression ?  ? [name] is a 76 year old woman who presents with generalized weakness, decreased activity tolerance and impaired cardiopulmonary endurance . At baseline she lives her with husband, is typically modified independent with ADLs, able to cook her herself meals (celiac diet) and ambulate in lower level of home. Reports hasn't been upstairs recently due to right knee pain. On evaluation she is min guard for transfers and ambulation with RW and O2 sat mostly 96-100% on RA but with patient reporting SOB and panting. HR up to 131 with approx 10 feet of ambulation. Patient would benefit from follow up rehab at SNF level to maximize IND and safety prior to dc home with limited assist.   ? ?Recommendations for follow up therapy are one component of a multi-disciplinary discharge planning process, led by the attending physician.  Recommendations may be updated based on patient status, additional functional criteria and insurance authorization. ? ?Follow Up Recommendations SNF ? ?  ?Assistance Recommended at Discharge Frequent or constant Supervision/Assistance  ?Patient can return home with the following ? A  little help with walking and/or transfers;A little help with bathing/dressing/bathroom;Assistance with cooking/housework;Assist for transportation;Help with stairs or ramp for entrance ? ?  ?Equipment Recommendations None recommended by PT  ?Recommendations for Other Services ?    ?  ?Functional Status Assessment Patient has had a recent decline in their functional status and demonstrates the ability to make significant improvements in function in a reasonable and predictable amount of time.  ? ?  ?Precautions / Restrictions Precautions ?Precautions: Other (comment) ?Precaution Comments: monitor HR and sat ?Restrictions ?Weight Bearing Restrictions: No  ? ?  ? ?Mobility ? Bed Mobility ?Overal bed mobility: Needs Assistance ?Bed Mobility: Supine to Sit ?  ?  ?Supine to sit: Supervision, HOB elevated ?  ?  ?General bed mobility comments: Increased time with use of bed rail ?  ? ?Transfers ?Overall transfer level: Needs assistance ?Equipment used: Rolling walker (2 wheels) ?Transfers: Sit to/from Stand, Bed to chair/wheelchair/BSC ?Sit to Stand: Min guard, From elevated surface, +2 safety/equipment ?  ?Step pivot transfers: Min guard, +2 safety/equipment ?  ?  ?  ?General transfer comment: Steady assist and use of RW ?  ? ?Ambulation/Gait ?Ambulation/Gait assistance: Min assist, +2 safety/equipment ?Gait Distance (Feet): 18 Feet ?Assistive device: Rolling walker (2 wheels) ?Gait Pattern/deviations: Step-to pattern, Step-through pattern, Decreased step length - right, Decreased step length - left, Shuffle, Trunk flexed ?Gait velocity: decr ?  ?  ?General Gait Details: Increased time with cues for posture and position from RW and multiple standing rest breaks ? ?Stairs ?  ?  ?  ?  ?  ? ?Wheelchair Mobility ?  ? ?Modified Rankin (Stroke Patients Only) ?  ? ?  ? ?Balance Overall balance assessment: Mild deficits observed, not formally tested ?  ?  ?  ?  ?  ?  ?  ?  ?  ?  ?  ?  ?  ?  ?  ?  ?  ?  ?   ? ? ? ?  Pertinent  Vitals/Pain Pain Assessment ?Pain Assessment: No/denies pain  ? ? ?Home Living Family/patient expects to be discharged to:: Private residence ?Living Arrangements: Spouse/significant other ?Available Help at Discharge: Family;Available 24 hours/day ?Type of Home: House ?Home Access: Stairs to enter ?Entrance Stairs-Rails: None ?Entrance Stairs-Number of Steps: 3 ?Alternate Level Stairs-Number of Steps: flight ?Home Layout: Two level;1/2 bath on main level ?Home Equipment: Conservation officer, nature (2 wheels);Shower seat;Hand held shower head ?Additional Comments: has been sleeping downstairs in recliner and doing sink baths in the half bath. reports problems with knee limiting her ability to get upstairs.  ?  ?Prior Function Prior Level of Function : Independent/Modified Independent ?  ?  ?  ?  ?  ?  ?Mobility Comments: had been using cane recently ?ADLs Comments: Mod I with ADLs, sink baths, slip on shoes, cooks for her self ?  ? ? ?Hand Dominance  ? Dominant Hand: Right ? ?  ?Extremity/Trunk Assessment  ? Upper Extremity Assessment ?Upper Extremity Assessment: Defer to OT evaluation ?RUE Deficits / Details: WFL ROM, 5/5 strength ?RUE Sensation: WNL ?RUE Coordination: WNL ?LUE Deficits / Details: WFL ROM, 3+/5 shoulder strength otherwise 5/5 ?LUE Sensation: WNL ?LUE Coordination: WNL ?  ? ?Lower Extremity Assessment ?Lower Extremity Assessment: Generalized weakness ?  ? ?Cervical / Trunk Assessment ?Cervical / Trunk Assessment: Normal  ?Communication  ? Communication: No difficulties  ?Cognition Arousal/Alertness: Awake/alert ?Behavior During Therapy: [facility] for tasks assessed/performed ?Overall Cognitive Status: Within Functional Limits for tasks assessed ?  ?  ?  ?  ?  ?  ?  ?  ?  ?  ?  ?  ?  ?  ?  ?  ?  ?  ?  ? ?  ?General Comments   ? ?  ?Exercises    ? ?Assessment/Plan  ?  ?PT Assessment Patient needs continued PT services  ?PT Problem List Decreased strength;Decreased activity tolerance;Decreased balance;Decreased  mobility;Decreased knowledge of use of DME ? ?   ?  ?PT Treatment Interventions DME instruction;Gait training;Stair training;Functional mobility training;Therapeutic activities;Therapeutic exercise;Balance training;Patient/family education   ? ?PT Goals (Current goals can be found in the Care Plan section)  ?Acute Rehab PT Goals ?Patient Stated Goal: Regain IND and not have to use O2 at home ?PT Goal Formulation: With patient ?Time For Goal Achievement: 04/16/22 ?Potential to Achieve Goals: Fair ? ?  ?Frequency Min 3X/week ?  ? ? ?Co-evaluation   ?  ?  ?  ?  ? ? ?  ?AM-PAC PT "6 Clicks" Mobility  ?Outcome Measure Help needed turning from your back to your side while in a flat bed without using bedrails?: A Little ?Help needed moving from lying on your back to sitting on the side of a flat bed without using bedrails?: A Little ?Help needed moving to and from a bed to a chair (including a wheelchair)?: A Little ?Help needed standing up from a chair using your arms (e.g., wheelchair or bedside chair)?: A Little ?Help needed to walk in hospital room?: A Lot ?Help needed climbing 3-5 steps with a railing? : Total ?6 Click Score: 15 ? ?  ?End of Session Equipment Utilized During Treatment: Gait belt;Oxygen ?Activity Tolerance: Patient limited by fatigue ?Patient left: in chair;with call bell/phone within reach;with chair alarm set;with family/visitor present ?Nurse Communication: Mobility status ?PT Visit Diagnosis: Difficulty in walking, not elsewhere classified (R26.2);Muscle weakness (generalized) (M62.81);Unsteadiness on feet (R26.81) ?  ? ?Time: 7026-3785 ?PT Time Calculation (min) (ACUTE ONLY): 39 min ? ? ?Charges:  PT Evaluation ?$PT Eval Low Complexity: 1 Low ?  ?  ?   ? ? ?Debe Coder PT ?Acute Rehabilitation Services ?Pager [phone] ?Office [phone] ? ? ?Yoniel Arkwright ?04/02/2022, 12:55 PM ? ?

## 2022-04-02 NOTE — Evaluation (Addendum)
Occupational Therapy Evaluation Patient Details Name: [name] MRN: [id number] DOB: [birth year] Today's Date: 04/02/2022   History of Present Illness y.o. female with medical history significant of  CMML, currently on chemotherapy, pancytopenia, rheumatoid arthritis on Remicade, paroxysmal A-fib (off of Eliquis for the last 2 days prior to presentation?  Secondary to bleeding complications versus pancytopenia), hypertension, COPD (follows up with pulmonary as an outpatient), bilateral pulmonary nodules presented with worsening shortness of breath. Found to be in A-fib with RVR.. CTA chest was negative for pulmonary embolism but showed bilateral pleural effusion, more on the right side with findings suggestive of multifocal pneumonia with numerous pleural-based right-sided masses   Clinical Impression   Mrs. [name] is a 76 year old woman who presents with generalized weakness, decreased activity tolerance and impaired cardiopulmonary endurance . At baseline she lives her with husband, is typically modified independent with ADLs, able to cook her herself meals (celiac diet) and ambulate in lower level of home. Reports hasn't been upstairs recently due to right knee pain. On evaluation she is min guard for transfers and ambulation with RW and needing increased assistance for LB ADLs due to fatigue and dyspnea. O2 sat mostly 96-100% on RA and 2L but with patient reporting shortness of breath and panting. HR up to 131 with approx 10 feet of ambulation. Patient will benefit from skilled OT services while in hospital to improve deficits and learn compensatory strategies as needed in order to return to PLOF.  Due to deficits and poor activity tolerance recommend short term rehab at discharge in order maximize physical and functional abilities prior to return home.      Recommendations for follow up therapy are one component of a multi-disciplinary discharge planning process, led by the attending  physician.  Recommendations may be updated based on patient status, additional functional criteria and insurance authorization.   Follow Up Recommendations  SNF    Assistance Recommended at Discharge Frequent Supervision   Patient can return home with the following A little help with walking and/or transfers;A little help with bathing/dressing/bathroom;Assistance with cooking/housework;Help with stairs or ramp for entrance    Functional Status Assessment  Patient has had a recent decline in their functional status and demonstrates the ability to make significant improvements in function in a reasonable and predictable amount of time.  Equipment Recommendations  None recommended by OT    Recommendations for Other Services       Precautions / Restrictions Precautions Precautions: Other (comment) Precaution Comments: monitor HR and sat Restrictions Weight Bearing Restrictions: No      Mobility Bed Mobility Overal bed mobility: Needs Assistance Bed Mobility: Supine to Sit     Supine to sit: Supervision, HOB elevated          Transfers Overall transfer level: Needs assistance Equipment used: Rolling walker (2 wheels) Transfers: Sit to/from Stand, Bed to chair/wheelchair/BSC Sit to Stand: Min guard, From elevated surface, +2 safety/equipment     Step pivot transfers: Min guard, +2 safety/equipment     General transfer comment: ABle to ambulate short distance in hallway but reports fatigue and shortness of breath and weak arms despite normal o2 sat. HR up to 131      Balance Overall balance assessment: Mild deficits observed, not formally tested  ADL either performed or assessed with clinical judgement   ADL Overall ADL's : Needs assistance/impaired Eating/Feeding: Set up;Sitting   Grooming: Set up;Sitting   Upper Body Bathing: Set up;Sitting   Lower Body Bathing: Minimal assistance;Sit to/from stand    Upper Body Dressing : Set up;Sitting   Lower Body Dressing: Moderate assistance;Sit to/from stand   Toilet Transfer: Min guard;Rolling walker (2 wheels);BSC/3in1   Toileting- Architect and Hygiene: Moderate assistance;Sit to/from stand       Functional mobility during ADLs: Min guard;Rolling walker (2 wheels) General ADL Comments: Patient limited by dyspnea and poor activity tolerance.     Vision Patient Visual Report: No change from baseline Vision Assessment?: No apparent visual deficits     Perception     Praxis      Pertinent Vitals/Pain Pain Assessment Pain Assessment: No/denies pain     Hand Dominance Right   Extremity/Trunk Assessment Upper Extremity Assessment Upper Extremity Assessment: RUE deficits/detail;LUE deficits/detail RUE Deficits / Details: WFL ROM, 5/5 strength RUE Sensation: WNL RUE Coordination: WNL LUE Deficits / Details: WFL ROM, 3+/5 shoulder strength otherwise 5/5 LUE Sensation: WNL LUE Coordination: WNL   Lower Extremity Assessment Lower Extremity Assessment: Defer to PT evaluation   Cervical / Trunk Assessment Cervical / Trunk Assessment: Normal   Communication Communication Communication: No difficulties   Cognition Arousal/Alertness: Awake/alert Behavior During Therapy: WFL for tasks assessed/performed Overall Cognitive Status: Within Functional Limits for tasks assessed                                       General Comments       Exercises     Shoulder Instructions      Home Living Family/patient expects to be discharged to:: Private residence Living Arrangements: Spouse/significant other Available Help at Discharge: Family;Available 24 hours/day Type of Home:  (townhome) Home Access: Stairs to enter [employer] of Steps: 3 Entrance Stairs-Rails: None Home Layout: Two level;1/2 bath on main level   Alternate Level Stairs-Rails: Right Bathroom Shower/Tub: Chartered loss adjuster Toilet: Handicapped height     Home Equipment: Agricultural consultant (2 wheels);Shower seat;Hand held shower head   Additional Comments: has been sleeping downstairs in recliner and doing sink baths in the half bath. reports problems with knee limiting her ability to get upstairs.      Prior Functioning/Environment Prior Level of Function : Independent/Modified Independent               ADLs Comments: Mod I with ADLs, sink baths, slip on shoes, cooks for her self        OT Problem List: Decreased strength;Decreased activity tolerance;Cardiopulmonary status limiting activity;Impaired balance (sitting and/or standing)      OT Treatment/Interventions: Self-care/ADL training;DME and/or AE instruction;Therapeutic activities;Balance training;Patient/family education;Therapeutic exercise    OT Goals(Current goals can be found in the care plan section) Acute Rehab OT Goals Patient Stated Goal: to be able to cook her meals and walk across two rooms in home without a break OT Goal Formulation: With patient Time For Goal Achievement: 04/16/22 Potential to Achieve Goals: Good  OT Frequency: Min 2X/week    Co-evaluation              AM-PAC OT "6 Clicks" Daily Activity     Outcome Measure Help from another person eating meals?: None Help from another person taking care of personal grooming?: A Little Help from another person  toileting, which includes using toliet, bedpan, or urinal?: A Lot Help from another person bathing (including washing, rinsing, drying)?: A Little Help from another person to put on and taking off regular upper body clothing?: A Little Help from another person to put on and taking off regular lower body clothing?: A Lot 6 Click Score: 17   End of Session Equipment Utilized During Treatment: Rolling walker (2 wheels);Oxygen Nurse Communication: Mobility status  Activity Tolerance: Patient limited by fatigue Patient left: in chair;with family/visitor  present  OT Visit Diagnosis: Muscle weakness (generalized) (M62.81)                Time: 1610-9604 OT Time Calculation (min): 37 min Charges:  OT General Charges $OT Visit: 1 Visit OT Evaluation $OT Eval Low Complexity: 1 Low  Faustine Tates, OTR/L Acute Care Rehab Services  Office [phone] Pager: [phone]   Kelli Churn 04/02/2022, 10:52 AM

## 2022-04-02 NOTE — Progress Notes (Signed)
?PROGRESS NOTE ? ? ? ?[name]  YYQ:[id number] DOB: March 16, 1946 DOA: 03/29/2022 ?PCP: [name], MD  ? ?Brief Narrative:  ?76 y.o. female with medical history significant of  CMML, currently on chemotherapy, pancytopenia, rheumatoid arthritis on Remicade, paroxysmal A-fib (off of Eliquis for the last 2 days prior to presentation?  Secondary to bleeding complications versus pancytopenia), hypertension, COPD (follows up with pulmonary as an outpatient), bilateral pulmonary nodules presented with worsening shortness of breath.  On presentation, she was found to be in A-fib with RVR and was started on IV Cardizem and heparin drips. CTA chest was negative for pulmonary embolism but showed bilateral pleural effusion, more on the right side with findings suggestive of multifocal pneumonia with numerous pleural-based right-sided masses, increased in size compared to previous CT. She was empirically started on IV antibiotics as per oncology recommendations.  She was put on IV steroids.  Cardiology was consulted.  She underwent ultrasound-guided right-sided thoracentesis on 03/30/2022.  Heparin drip was also added on 03/31/2022 due to drop in hemoglobin.  She was transfused 1 unit packed red cells on 03/31/2022. ? ?Assessment & Plan: ?  ?Paroxysmal A-fib with RVR ?-Rate mostly controlled currently. ?-Cardiology following.  Continue Toprol-XL, increased dose. ?-Heparin drip on hold since 03/31/2022 because drop in hemoglobin.  Eliquis was recently held for the last few days because of?  Pancytopenia/bleeding complications.  Monitor platelets.  Will not resume anticoagulation till cleared by oncology/cardiology ?  ?Dyspnea ?COPD with possible exacerbation ??  Possible bacterial multifocal pneumonia ?Bilateral pleural effusion, more on the right side ?Multiple pulmonary nodules ?-Patient has had progressively worsening dyspnea over the last few weeks/months.  Apparently did not tolerate brezteri as an outpatient and was  switched back to Stiolto by pulmonary.   ?-Continue current inhaled regimen.  Decrease limited to 40 mg IV every 24 hours. ?-Currently on cefepime and Zithromax.  Complete 5-day course of therapy.  Procalcitonin less than 0.1.  Unclear if her symptoms are because of bacterial pneumonia versus pleural effusion (?  Malignant in origin).  Respiratory virus panel negative. ?-Status post ultrasound-guided thoracentesis on 03/30/2022 and removal of 570 cc.  Follow fluid analysis including cytology ?-BNP not significantly elevated.  Troponins have not trended up ? ?New onset cardiomyopathy ?Mild to moderate mitral valve regurgitation ?-Echo showed EF of 45 to 50% with mild to moderate mitral valve regurgitation.  Continue strict input and output.  Daily weights.  Fluid restriction.  Follow cardiology recommendations.  Patient received few doses of IV Lasix.  Further doses as per cardiology. ? ?CKD IIIa ?-Creatinine at baseline. Monitor ? ?Hyponatremia ?-Mild, monitor ?  ?CMML, currently on chemotherapy ?Pancytopenia ?-Oncology following. ?-Still pancytopenic, platelets 81 today.  Status post 1 unit packed red cell transfusion on 03/31/2022.  Hemoglobin 8.2 this morning.  Monitor H&H.   ? ?Hypertension ?-Blood pressure currently stable.  Monitor.  Continue Toprol-XL. ? ?History of rheumatoid arthritis ?-Outpatient follow-up with rheumatology.  Is on Remicade as an outpatient. ?  ?Bilateral lower extremity swelling, more on the left side ?-Lower extremity duplex was negative for DVT. ? ? ? ?DVT prophylaxis: Heparin drip discontinued in the morning of 03/31/2022 ?Code Status: Full ?Family Communication: Husband at bedside on 03/30/2022 ?Disposition Plan: ?Status is: Inpatient ?Remains inpatient appropriate because: Of severity of illness ? ? ? ?Consultants: Cardiology/oncology ? ?Procedures: Ultrasound-guided thoracentesis on 03/30/2022/echo ? ?Antimicrobials:  ?Anti-infectives (From admission, onward)  ? ? Start     Dose/Rate Route  Frequency Ordered Stop  ? 03/30/22 1400  ceFEPIme (MAXIPIME) 2 g in sodium chloride 0.9 % 100 mL IVPB       ? 2 g ?200 mL/hr over 30 Minutes Intravenous Every 12 hours 03/30/22 1025    ? 03/30/22 0800  ceFEPIme (MAXIPIME) 2 g in sodium chloride 0.9 % 100 mL IVPB  Status:  Discontinued       ? 2 g ?200 mL/hr over 30 Minutes Intravenous Every 12 hours 03/29/22 1754 03/29/22 1757  ? 03/30/22 0200  ceFEPIme (MAXIPIME) 2 g in sodium chloride 0.9 % 100 mL IVPB  Status:  Discontinued       ? 2 g ?200 mL/hr over 30 Minutes Intravenous Every 8 hours 03/29/22 1757 03/30/22 1025  ? 03/29/22 1800  azithromycin (ZITHROMAX) 500 mg in sodium chloride 0.9 % 250 mL IVPB       ? 500 mg ?250 mL/hr over 60 Minutes Intravenous Every 24 hours 03/29/22 1747 04/03/22 1759  ? 03/29/22 1745  ceFEPIme (MAXIPIME) 2 g in sodium chloride 0.9 % 100 mL IVPB       ? 2 g ?200 mL/hr over 30 Minutes Intravenous  Once 03/29/22 1735 03/29/22 2132  ? ?  ? ? ? ?Subjective: ?Patient seen and examined at bedside.  Denies worsening fever, nausea, vomiting or chest pain.  Still short of breath with exertion and feels weak.  Slept better last night. ?Objective: ?Vitals:  ? 04/02/22 0400 04/02/22 0500 04/02/22 0600 04/02/22 0700  ?BP:  (!) 104/54 117/70 (!) 116/54  ?Pulse: 75 74 73 83  ?Resp: 19 14 14 19   ?Temp:      ?TempSrc:      ?SpO2: 99% 100% 99% 99%  ?Weight:  60.9 kg    ?Height:      ? ? ?Intake/Output Summary (Last 24 hours) at 04/02/2022 0739 ?Last data filed at 04/02/2022 0600 ?Gross per 24 hour  ?Intake 815.16 ml  ?Output 700 ml  ?Net 115.16 ml  ? ? ?Filed Weights  ? 03/31/22 0500 04/01/22 0500 04/02/22 0500  ?Weight: 59.2 kg 61.2 kg 60.9 kg  ? ? ?Examination: ? ?General: On room air.  No distress.  Chronically ill and deconditioned looking. ?ENT/neck: No thyromegaly.  JVD is not elevated  ?respiratory: Decreased breath sounds at bases bilaterally with some crackles  ?CVS: S1-S2 heard; currently rate controlled ?abdominal: Soft, nontender, slightly  distended; no organomegaly, bowel sounds are heard  ?extremities: Lower extremity edema bilaterally, more on the left side; no cyanosis  ?CNS: Still slow to respond.  Awake.  Poor historian.  No focal neurologic deficit.  Moves extremities  ?lymph: No cervical lymphadenopathy ?Skin: No obvious petechiae/lesions  ?psych: No agitation currently.  Flat affect mostly  ?musculoskeletal: No obvious joint deformity/tenderness ? ? ?Data Reviewed: I have personally reviewed following labs and imaging studies ? ?CBC: ?Recent Labs  ?Lab 03/27/22 ?1153 03/29/22 ?1403 03/30/22 ?2376 03/31/22 ?0245 03/31/22 ?1111 04/01/22 ?0500 04/02/22 ?0610  ?WBC 0.9* 0.9* 0.5* 0.8*  --  1.1* 1.0*  ?NEUTROABS 0.3* 0.2*  --   --   --   --   --   ?HGB 8.4* 9.4* 8.1* 7.0* 9.0* 8.6* 8.2*  ?HCT 26.9* 29.6* 25.7* 21.8* 27.8* 26.7* 26.1*  ?MCV 106.7* 106.5* 108.0* 106.3*  --  102.3* 104.0*  ?PLT 41* 49* 43* 44*  --  65* 81*  ? ? ?Basic Metabolic Panel: ?Recent Labs  ?Lab 03/29/22 ?1403 03/30/22 ?2831 03/31/22 ?0245 04/01/22 ?0500 04/02/22 ?0610  ?NA 138 134* 133* 135 134*  ?K 4.0 4.1 4.3 4.0  4.2  ?CL 107 105 103 106 105  ?CO2 23 20* 21* 19* 21*  ?GLUCOSE 126* 166* 174* 183* 182*  ?BUN 27* 27* 37* 43* 49*  ?CREATININE 0.93 0.83 1.17* 1.10* 1.09*  ?CALCIUM 9.2 8.4* 8.0* 8.3* 8.1*  ?MG  --  1.9 2.0 2.3 2.4  ? ? ?GFR: ?Estimated Creatinine Clearance: 38.5 mL/min (A) (by C-G formula based on SCr of 1.09 mg/dL (H)). ?Liver Function Tests: ?Recent Labs  ?Lab 03/29/22 ?1403 03/30/22 ?7125  ?AST 16 18  ?ALT 13 14  ?ALKPHOS 37* 34*  ?BILITOT 1.1 0.8  ?PROT 6.4* 5.8*  ?ALBUMIN 3.5 3.3*  ? ? ?No results for input(s): LIPASE, AMYLASE in the last 168 hours. ?No results for input(s): AMMONIA in the last 168 hours. ?Coagulation Profile: ?Recent Labs  ?Lab 03/29/22 ?1403  ?INR 1.2  ? ? ?Cardiac Enzymes: ?No results for input(s): CKTOTAL, CKMB, CKMBINDEX, TROPONINI in the last 168 hours. ?BNP (last 3 results) ?No results for input(s): PROBNP in the last 8760  hours. ?HbA1C: ?No results for input(s): HGBA1C in the last 72 hours. ?CBG: ?Recent Labs  ?Lab 03/29/22 ?2042  ?GLUCAP 151*  ? ? ?Lipid Profile: ?No results for input(s): CHOL, HDL, LDLCALC, TRIG, CHOLHDL, LDLDIRECT in the

## 2022-04-02 NOTE — Progress Notes (Addendum)
? ?Progress Note ? ?Patient Name: [name] ?Date of Encounter: 04/02/2022 ? ?[facility] Cardiologist: Mertie Moores, MD  ? ?Subjective  ? ?Rates in atrial fibrillation are improved.  She still remains short of breath. ? ? ?Inpatient Medications  ?  ?Scheduled Meds: ? arformoterol  15 mcg Nebulization BID  ? And  ? umeclidinium bromide  1 puff Inhalation Daily  ? budesonide (PULMICORT) nebulizer solution  2 mg Nebulization Q12H  ? Chlorhexidine Gluconate Cloth  6 each Topical Daily  ? [START ON 04/03/2022] methylPREDNISolone (SOLU-MEDROL) injection  40 mg Intravenous Q24H  ? metoprolol succinate  100 mg Oral BID  ? senna-docusate  1 tablet Oral BID  ? sodium chloride flush  10-40 mL Intracatheter Q12H  ? ?Continuous Infusions: ? sodium chloride Stopped (04/01/22 1557)  ? azithromycin Stopped (04/01/22 1924)  ? ceFEPime (MAXIPIME) IV Stopped (04/02/22 0202)  ? diltiazem (CARDIZEM) infusion 12.5 mg/hr (04/02/22 0600)  ? ?PRN Meds: ?sodium chloride, acetaminophen **OR** acetaminophen, albuterol, ondansetron **OR** ondansetron (ZOFRAN) IV, polyethylene glycol, sodium chloride flush, traZODone  ? ?Vital Signs  ?  ?Vitals:  ? 04/02/22 0741 04/02/22 0743 04/02/22 0800 04/02/22 0830  ?BP:   132/65 123/66  ?Pulse:   82 (!) 126  ?Resp:   (!) 25 (!) 26  ?Temp:      ?TempSrc:      ?SpO2: 99% 99% 98% 99%  ?Weight:      ?Height:      ? ? ?Intake/Output Summary (Last 24 hours) at 04/02/2022 0909 ?Last data filed at 04/02/2022 0748 ?Gross per 24 hour  ?Intake 795.17 ml  ?Output 1300 ml  ?Net -504.83 ml  ? ? ? ?  04/02/2022  ?  5:00 AM 04/01/2022  ?  5:00 AM 03/31/2022  ?  5:00 AM  ?Last 3 Weights  ?Weight (lbs) 134 lb 4.2 oz 134 lb 14.7 oz 130 lb 8.2 oz  ?Weight (kg) 60.9 kg 61.2 kg 59.2 kg  ?   ? ?Telemetry  ?  ?Atrial fibrillation-personally reviewed ? ?ECG  ?  ?None new ? ?Physical Exam  ? ?GEN: Well nourished, well developed, in no acute distress  ?HEENT: normal  ?Neck: no JVD, carotid bruits, or masses ?Cardiac: irregular; no  murmurs, rubs, or gallops,no edema  ?Respiratory:  clear to auscultation bilaterally, normal work of breathing ?GI: soft, nontender, nondistended, + BS ?MS: no deformity or atrophy  ?Skin: warm and dry,  ?Neuro:  Strength and sensation are intact ?Psych: euthymic mood, full affect  ? ? ?Labs  ?  ?High Sensitivity Troponin:   ?Recent Labs  ?Lab 03/29/22 ?1403 03/29/22 ?1603  ?TROPONINIHS 20* 18*  ? ?   ?Chemistry ?Recent Labs  ?Lab 03/29/22 ?1403 03/29/22 ?1403 03/30/22 ?2876 03/31/22 ?0245 04/01/22 ?0500 04/02/22 ?0610  ?NA 138  --  134* 133* 135 134*  ?K 4.0  --  4.1 4.3 4.0 4.2  ?CL 107  --  105 103 106 105  ?CO2 23  --  20* 21* 19* 21*  ?GLUCOSE 126*  --  166* 174* 183* 182*  ?BUN 27*  --  27* 37* 43* 49*  ?CREATININE 0.93  --  0.83 1.17* 1.10* 1.09*  ?CALCIUM 9.2  --  8.4* 8.0* 8.3* 8.1*  ?MG  --    < > 1.9 2.0 2.3 2.4  ?PROT 6.4*  --  5.8*  --   --   --   ?ALBUMIN 3.5  --  3.3*  --   --   --   ?  AST 16  --  18  --   --   --   ?ALT 13  --  14  --   --   --   ?ALKPHOS 37*  --  34*  --   --   --   ?BILITOT 1.1  --  0.8  --   --   --   ?GFRNONAA >60  --  >60 49* 52* 53*  ?ANIONGAP 8  --  9 9 10 8   ? < > = values in this interval not displayed.  ? ?  ?Lipids No results for input(s): CHOL, TRIG, HDL, LABVLDL, LDLCALC, CHOLHDL in the last 168 hours.  ?Hematology ?Recent Labs  ?Lab 03/31/22 ?0245 03/31/22 ?1111 04/01/22 ?0500 04/02/22 ?0610  ?WBC 0.8*  --  1.1* 1.0*  ?RBC 2.05*  --  2.61* 2.51*  ?HGB 7.0* 9.0* 8.6* 8.2*  ?HCT 21.8* 27.8* 26.7* 26.1*  ?MCV 106.3*  --  102.3* 104.0*  ?MCH 34.1*  --  33.0 32.7  ?MCHC 32.1  --  32.2 31.4  ?RDW 15.8*  --  17.9* 17.4*  ?PLT 44*  --  65* 81*  ? ? ?Thyroid  ?Recent Labs  ?Lab 03/29/22 ?1403  ?TSH 0.731  ?FREET4 1.47*  ? ?  ?BNP ?Recent Labs  ?Lab 03/29/22 ?1406  ?BNP 474.3*  ? ?  ?DDimer  ?Recent Labs  ?Lab 03/29/22 ?1403  ?DDIMER 2.36*  ? ?  ? ?Radiology  ?  ?No results found. ? ?Cardiac Studies  ? ?Echocardiogram 03/30/22  ?1. Left ventricular ejection fraction, by estimation,  is 45 to 50%. Left  ?ventricular ejection fraction by 2D MOD biplane is 48.0 %. The left  ?ventricle has mildly decreased function. The left ventricle demonstrates  ?global hypokinesis. Left ventricular  ?diastolic function could not be evaluated.  ? 2. Right ventricular systolic function is mildly reduced. The right  ?ventricular size is normal. There is mildly elevated pulmonary artery  ?systolic pressure. The estimated right ventricular systolic pressure is  ?83.2 mmHg.  ? 3. The mitral valve is abnormal. Mild to moderate mitral valve  ?regurgitation.  ? 4. The aortic valve is tricuspid. Aortic valve regurgitation is not  ?visualized. Aortic valve sclerosis is present, with no evidence of aortic  ?valve stenosis.  ? 5. The inferior vena cava is dilated in size with <50% respiratory  ?variability, suggesting right atrial pressure of 15 mmHg.  ? ?Comparison(s): Changes from prior study are noted. 10/27/2021: LVEF 60-65 ? ?Patient Profile  ?   ?76 y.o. female with a history of  paroxysmal atrial fibrillation not on anticoagulation, hypertension, hyperlipidemia, CKD stage III, COPD, CMML with associated pancytopenia, rheumatoid arthritis, and chronic back pain who was seen for evaluation of rapid atrial fibrillation at the request of Dr. Starla Link ? ?Assessment & Plan  ?  ?1.  Paroxysmal atrial fibrillation: Unfortunately she does remain in atrial fibrillation.  Rate control is much improved on Toprol-XL 100 mg twice daily.  CHA2DS2-VASc of 4.  Would anticoagulate when she can tolerate it as she is pancytopenic. ? ?2.  Acute Systolic heart failure: Ejection fraction mildly reduced with mildly elevated BNP.  We Nicholas Trompeter give her a dose of IV Lasix to see if this improves her respiratory status. ? ?3.  Hypertension: Blood pressure currently well controlled ? ?4.  CML: Has pancytopenia.  Plan per primary team. ? ? ?For questions or updates, please contact [place] ?Please consult [email or web address] for contact info under  ? ?   ?   ?  Signed, ?Malosi Hemstreet Meredith Leeds, MD  ?04/02/2022, 9:09 AM   ? ? ?

## 2022-04-03 ENCOUNTER — Encounter: Payer: Self-pay | Admitting: Hematology

## 2022-04-03 ENCOUNTER — Inpatient Hospital Stay: Payer: Medicare Other

## 2022-04-03 ENCOUNTER — Inpatient Hospital Stay: Payer: Medicare Other | Admitting: Hematology

## 2022-04-03 ENCOUNTER — Encounter (HOSPITAL_COMMUNITY): Payer: Self-pay | Admitting: Internal Medicine

## 2022-04-03 DIAGNOSIS — Z87898 Personal history of other specified conditions: Secondary | ICD-10-CM | POA: Diagnosis not present

## 2022-04-03 DIAGNOSIS — I4891 Unspecified atrial fibrillation: Secondary | ICD-10-CM | POA: Diagnosis not present

## 2022-04-03 DIAGNOSIS — J90 Pleural effusion, not elsewhere classified: Secondary | ICD-10-CM | POA: Diagnosis not present

## 2022-04-03 DIAGNOSIS — C931 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: Secondary | ICD-10-CM | POA: Diagnosis not present

## 2022-04-03 LAB — CBC
HCT: 27.2 % — ABNORMAL LOW (ref 36.0–46.0)
Hemoglobin: 8.5 g/dL — ABNORMAL LOW (ref 12.0–15.0)
MCH: 32.6 pg (ref 26.0–34.0)
MCHC: 31.3 g/dL (ref 30.0–36.0)
MCV: 104.2 fL — ABNORMAL HIGH (ref 80.0–100.0)
Platelets: 101 10*3/uL — ABNORMAL LOW (ref 150–400)
RBC: 2.61 MIL/uL — ABNORMAL LOW (ref 3.87–5.11)
RDW: 17 % — ABNORMAL HIGH (ref 11.5–15.5)
WBC: 1.7 10*3/uL — ABNORMAL LOW (ref 4.0–10.5)
nRBC: 0 % (ref 0.0–0.2)

## 2022-04-03 LAB — BASIC METABOLIC PANEL
Anion gap: 8 (ref 5–15)
BUN: 51 mg/dL — ABNORMAL HIGH (ref 8–23)
CO2: 20 mmol/L — ABNORMAL LOW (ref 22–32)
Calcium: 8.2 mg/dL — ABNORMAL LOW (ref 8.9–10.3)
Chloride: 108 mmol/L (ref 98–111)
Creatinine, Ser: 1.11 mg/dL — ABNORMAL HIGH (ref 0.44–1.00)
GFR, Estimated: 52 mL/min — ABNORMAL LOW (ref >60)
Glucose, Bld: 167 mg/dL — ABNORMAL HIGH (ref 70–99)
Potassium: 3.8 mmol/L (ref 3.5–5.1)
Sodium: 136 mmol/L (ref 135–145)

## 2022-04-03 LAB — HEPARIN LEVEL (UNFRACTIONATED): Heparin Unfractionated: 0.27 IU/mL — ABNORMAL LOW (ref 0.30–0.70)

## 2022-04-03 LAB — MAGNESIUM: Magnesium: 2.5 mg/dL — ABNORMAL HIGH (ref 1.7–2.4)

## 2022-04-03 LAB — CYTOLOGY - NON PAP

## 2022-04-03 MED ORDER — HEPARIN (PORCINE) 25000 UT/250ML-% IV SOLN
1000.0000 [IU]/h | INTRAVENOUS | Status: DC
  Administered 2022-04-03: 900 [IU]/h via INTRAVENOUS
  Administered 2022-04-04: 1000 [IU]/h via INTRAVENOUS
  Filled 2022-04-03 (×2): qty 250

## 2022-04-03 MED ORDER — METOPROLOL SUCCINATE ER 50 MG PO TB24
150.0000 mg | ORAL_TABLET | Freq: Two times a day (BID) | ORAL | Status: DC
  Administered 2022-04-03 – 2022-04-12 (×17): 150 mg via ORAL
  Filled 2022-04-03: qty 6
  Filled 2022-04-03 (×6): qty 1
  Filled 2022-04-03: qty 6
  Filled 2022-04-03: qty 1
  Filled 2022-04-03: qty 6
  Filled 2022-04-03 (×2): qty 1
  Filled 2022-04-03: qty 6
  Filled 2022-04-03 (×3): qty 1
  Filled 2022-04-03 (×2): qty 6

## 2022-04-03 MED ORDER — FUROSEMIDE 10 MG/ML IJ SOLN
40.0000 mg | Freq: Once | INTRAMUSCULAR | Status: AC
  Administered 2022-04-03: 40 mg via INTRAVENOUS
  Filled 2022-04-03: qty 4

## 2022-04-03 MED ORDER — MIRTAZAPINE 15 MG PO TABS
7.5000 mg | ORAL_TABLET | Freq: Every day | ORAL | Status: DC
  Administered 2022-04-03 – 2022-04-12 (×10): 7.5 mg via ORAL
  Filled 2022-04-03 (×10): qty 1

## 2022-04-03 MED ORDER — AMIODARONE HCL 200 MG PO TABS: 200.0000 mg | ORAL_TABLET | Freq: Every day | ORAL | Status: DC

## 2022-04-03 MED ORDER — AMIODARONE HCL 200 MG PO TABS: 400.0000 mg | ORAL_TABLET | Freq: Two times a day (BID) | ORAL | Status: DC

## 2022-04-03 NOTE — Progress Notes (Signed)
ANTICOAGULATION CONSULT NOTE  ?Pharmacy Consult for Heparin ?Indication: atrial fibrillation ? ?Allergies  ?Allergen Reactions  ? Gluten Meal Diarrhea  ? Penicillins Rash and Other (See Comments)  ?  'Many years ago' ?  ? Alendronate Rash  ? Hydroxychloroquine Rash  ? ? ?Patient Measurements: ?Height: 5' 4"  (162.6 cm) ?Weight: 60.9 kg (134 lb 4.2 oz) ?IBW/kg (Calculated) : 54.7 ?Heparin Dosing Weight: 57.6 kg ? ?Vital Signs: ?Temp: 97.8 ?F (36.6 ?C) (04/10 0400) ?Temp Source: Axillary (04/10 0400) ?BP: 125/70 (04/10 0400) ?Pulse Rate: 79 (04/10 0600) ? ?Labs: ?Recent Labs  ?  04/01/22 ?0500 04/02/22 ?0610 04/03/22 ?0453  ?HGB 8.6* 8.2* 8.5*  ?HCT 26.7* 26.1* 27.2*  ?PLT 65* 81* 101*  ?HEPARINUNFRC <0.10*  --   --   ?CREATININE 1.10* 1.09* 1.11*  ? ? ? ?Estimated Creatinine Clearance: 37.8 mL/min (A) (by C-G formula based on SCr of 1.11 mg/dL (H)). ? ? 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  Has known hx afib on metoprolol and previously on Eliquis which was stopped on 4/3 due to pancytopenia and an episode of blood in the toilet bowl noted in 3/27 Oncology notes.  Pharmacy is consulted to dose IV heparin for afib. ? ?Last dose of Eliquis taken Monday 4/3 ? ?4/5: Baseline aPTT = 29, Heparin level = 0.27, PT/INR = 15.4/1.2 ? ?04/03/2022 ?Pharmacy consulted to resume heparin drip 4/10 am. ?Heparin was previously stopped on 4/7. ?OK to resume per Dr Burr Medico.  No bolus per d/w Dr Starla Link ?Hg 8.5, PLT up to 101, SCr 1.11 ?No bleeding reported ? ?Goal of Therapy:  ?Heparin level 0.3-0.7 units/ml ?Monitor platelets by anticoagulation protocol: Yes ?  ?Plan:  ?Resume heparin drip at 900 units/hr and check 8 hour heparin level ?No heparin bolus per d/w [facility] MD ?Daily CBC and heparin level ?F/u ability to resume PTA apixaban ? ? ?Eudelia Bunch, Pharm.D ?04/03/2022 8:21 AM ? ?

## 2022-04-03 NOTE — Progress Notes (Addendum)
? ?Progress Note ? ?Patient Name: [name] ?Date of Encounter: 04/03/2022 ? ?[facility] Cardiologist: Mertie Moores, MD  ? ?Subjective  ? ?Patient continues to be short of breath, some improvement with breathing treatments. Denies any chest pain. Occasional palpitations. Dizziness/feeling like her ears are going to pop  ? ?Inpatient Medications  ?  ?Scheduled Meds: ? arformoterol  15 mcg Nebulization BID  ? And  ? umeclidinium bromide  1 puff Inhalation Daily  ? budesonide (PULMICORT) nebulizer solution  2 mg Nebulization Q12H  ? Chlorhexidine Gluconate Cloth  6 each Topical Daily  ? methylPREDNISolone (SOLU-MEDROL) injection  40 mg Intravenous Q24H  ? metoprolol succinate  100 mg Oral BID  ? senna-docusate  1 tablet Oral BID  ? sodium chloride flush  10-40 mL Intracatheter Q12H  ? ?Continuous Infusions: ? sodium chloride Stopped (04/01/22 1557)  ? ceFEPime (MAXIPIME) IV Stopped (04/03/22 0865)  ? diltiazem (CARDIZEM) infusion 12.5 mg/hr (04/03/22 0355)  ? ?PRN Meds: ?sodium chloride, acetaminophen **OR** acetaminophen, albuterol, ondansetron **OR** ondansetron (ZOFRAN) IV, polyethylene glycol, sodium chloride flush, traZODone  ? ?Vital Signs  ?  ?Vitals:  ? 04/03/22 0000 04/03/22 0400 04/03/22 0500 04/03/22 0600  ?BP: (!) 120/52 125/70    ?Pulse: 68 (!) 38 95 79  ?Resp: 16 (!) 24 (!) 22 (!) 21  ?Temp:  97.8 ?F (36.6 ?C)    ?TempSrc:  Axillary    ?SpO2: 95% 92% 95% 95%  ?Weight:   60.9 kg   ?Height:      ? ? ?Intake/Output Summary (Last 24 hours) at 04/03/2022 0734 ?Last data filed at 04/03/2022 0507 ?Gross per 24 hour  ?Intake 646.31 ml  ?Output 1325 ml  ?Net -678.69 ml  ? ? ?  04/03/2022  ?  5:00 AM 04/02/2022  ?  5:00 AM 04/01/2022  ?  5:00 AM  ?Last 3 Weights  ?Weight (lbs) 134 lb 4.2 oz 134 lb 4.2 oz 134 lb 14.7 oz  ?Weight (kg) 60.9 kg 60.9 kg 61.2 kg  ?   ? ?Telemetry  ?  ?Atrial fibrillation, HR in the 80s - Personally Reviewed ? ?ECG  ?  ?No new tracings since 4/6 - Personally Reviewed ? ?Physical Exam   ? ?GEN: No acute distress, sitting comfortably in the bed  ?Neck: No JVD ?Cardiac: Irregular rate and rhythm, no murmurs, rubs, or gallops.  ?Respiratory: Fine crackles in left lung base ?GI: Soft, nontender, non-distended  ?MS: 1+ pedal edema in BLE  ?Neuro:  Nonfocal  ?Psych: Normal affect  ? ?Labs  ?  ?High Sensitivity Troponin:   ?Recent Labs  ?Lab 03/29/22 ?1403 03/29/22 ?1603  ?TROPONINIHS 20* 18*  ?   ?Chemistry ?Recent Labs  ?Lab 03/29/22 ?1403 03/30/22 ?7846 03/31/22 ?0245 04/01/22 ?0500 04/02/22 ?0610 04/03/22 ?0453  ?NA 138 134*   < > 135 134* 136  ?K 4.0 4.1   < > 4.0 4.2 3.8  ?CL 107 105   < > 106 105 108  ?CO2 23 20*   < > 19* 21* 20*  ?GLUCOSE 126* 166*   < > 183* 182* 167*  ?BUN 27* 27*   < > 43* 49* 51*  ?CREATININE 0.93 0.83   < > 1.10* 1.09* 1.11*  ?CALCIUM 9.2 8.4*   < > 8.3* 8.1* 8.2*  ?MG  --  1.9   < > 2.3 2.4 2.5*  ?PROT 6.4* 5.8*  --   --   --   --   ?ALBUMIN 3.5 3.3*  --   --   --   --   ?  AST 16 18  --   --   --   --   ?ALT 13 14  --   --   --   --   ?ALKPHOS 37* 34*  --   --   --   --   ?BILITOT 1.1 0.8  --   --   --   --   ?GFRNONAA >60 >60   < > 52* 53* 52*  ?ANIONGAP 8 9   < > 10 8 8   ? < > = values in this interval not displayed.  ?  ?Lipids No results for input(s): CHOL, TRIG, HDL, LABVLDL, LDLCALC, CHOLHDL in the last 168 hours.  ?Hematology ?Recent Labs  ?Lab 04/01/22 ?0500 04/02/22 ?0610 04/03/22 ?0453  ?WBC 1.1* 1.0* 1.7*  ?RBC 2.61* 2.51* 2.61*  ?HGB 8.6* 8.2* 8.5*  ?HCT 26.7* 26.1* 27.2*  ?MCV 102.3* 104.0* 104.2*  ?MCH 33.0 32.7 32.6  ?MCHC 32.2 31.4 31.3  ?RDW 17.9* 17.4* 17.0*  ?PLT 65* 81* 101*  ? ?Thyroid  ?Recent Labs  ?Lab 03/29/22 ?1403  ?TSH 0.731  ?FREET4 1.47*  ?  ?BNP ?Recent Labs  ?Lab 03/29/22 ?1406  ?BNP 474.3*  ?  ?DDimer  ?Recent Labs  ?Lab 03/29/22 ?1403  ?DDIMER 2.36*  ?  ? ?Radiology  ?  ?No results found. ? ?Cardiac Studies  ? ?Echocardiogram 03/30/22 ? 1. Left ventricular ejection fraction, by estimation, is 45 to 50%. Left  ?ventricular ejection fraction by  2D MOD biplane is 48.0 %. The left  ?ventricle has mildly decreased function. The left ventricle demonstrates  ?global hypokinesis. Left ventricular  ?diastolic function could not be evaluated.  ? 2. Right ventricular systolic function is mildly reduced. The right  ?ventricular size is normal. There is mildly elevated pulmonary artery  ?systolic pressure. The estimated right ventricular systolic pressure is  ?76.8 mmHg.  ? 3. The mitral valve is abnormal. Mild to moderate mitral valve  ?regurgitation.  ? 4. The aortic valve is tricuspid. Aortic valve regurgitation is not  ?visualized. Aortic valve sclerosis is present, with no evidence of aortic  ?valve stenosis.  ? 5. The inferior vena cava is dilated in size with <50% respiratory  ?variability, suggesting right atrial pressure of 15 mmHg.  ? ?Comparison(s): Changes from prior study are noted. 10/27/2021: LVEF 60-65%.  ? ?Patient Profile  ?   ?76 y.o. female with a history of  paroxysmal atrial fibrillation not on anticoagulation, hypertension, hyperlipidemia, CKD stage III, COPD, CMML with associated pancytopenia, rheumatoid arthritis, and chronic back pain who was seen for evaluation of rapid atrial fibrillation at the request of Dr. Starla Link ? ?Assessment & Plan  ?  ?Paroxysmal Atrial Fibrillation  ?- Per telemetry- patient remains in atrial fibrillation with HR predominantly in the 90s overnight.100s-130s this AM  ?- Suspect tachycardia is related to anemia/pancytopenia and PNA ?- Echocardiogram findings above  ?- Patient has been on IV diltiazem. Ideally would like to transition off of diltiazem given reduced EF. See MD note. ?- Continue metoprolol 100 mg BID  ?- CHA2DS-VASc = 4 (HTN, age x2, female). Patient was on Eliquis at home but this was recently stopped due to worsening pancytopenia. She was started on IV Heparin here. However, noted to have a drop in hemoglobin from 8.1>7.0 and was given 1 unit PRBC. Heparin held  ?- Starting back on IV heparin today   ? ?Dyspnea On Exertion ?Cardiomyopathy  ?- BNP elevated at 474.  ?- Chest CTA was negative for PE but showed moderate  right and trace left pleural effusions with patchy airspace opacities within the right lower lobe and dependent portion of the left upper lobe suspicious for multifocal pneumonia.  ?- On antibiotics, solumedrol, and breathing treatments per primary team  ?- Echocardiogram showed a mildly reduced EF compared to previous ?- Will manage cardiomyopathy medically and will reassess following resolution of her acute illness  ?- Continue metoprolol 100 mg BID  ?- BP soft, will restart home losartan when BP tolerates  ?- Given 40 mg IV lasix x1 yesterday, output 1.325 L urine. ?- Patient continues to be SOB, swelling in feet, crackles in lungs. I will give an additional dose of lasix today. I am hesitant to schedule daily lasix as patient's creatinine has been higher than her baseline (1.11 today, usually 0.8-0.9)  ?  ?Hypertension ?- BP well controlled. ?- Continue IV diltiazem for now- possibly able to transition to PO later today/tomorrow  ?- Continue metoprolol 100 mg BID  ?- Holding home Losartan for now to allow for more rate control if needed. ?  ?CMML ?Pancytopenia ?Secondary to CMML. She has had worsening of her pancytopenia recently.  ?- Management per Oncology and primary team ?  ?CKD Stage III ?- Avoid nephrotoxic medications  ?  ?Otherwise, per primary team: ?- Pneumonia ?- COPD ?- Rheumatoid Arthritis ?- Chronic back pain  ? ?   ? ?For questions or updates, please contact [place] ?Please consult [email or web address] for contact info under  ? ?  ?   ?Signed, ?Margie Billet, PA-C  ?04/03/2022, 7:34 AM   ? ?Patient seen and examined with KJ PA.  Agree as above, with the following exceptions and changes as noted below.  Patient feels dizzy today which she feels is a new symptom as of this morning.  No palpitations, no chest pain, continued shortness of breath.. Gen: NAD, CV: iRRR, no murmurs,  Lungs: clear, Abd: soft, Extrem: Warm, well perfused, 1+ pedal edema, neuro/Psych: alert and oriented x 3, normal mood and affect. All available labs, radiology testing, previous records reviewed.  I ha

## 2022-04-03 NOTE — Progress Notes (Signed)
ANTICOAGULATION CONSULT NOTE  ? ?Pharmacy Consult for Heparin ?Indication: atrial fibrillation ? ?Allergies  ?Allergen Reactions  ? Gluten Meal Diarrhea  ? Penicillins Rash and Other (See Comments)  ?  'Many years ago' ?  ? Alendronate Rash  ? Hydroxychloroquine Rash  ? ? ?Patient Measurements: ?Height: 5' 4"  (162.6 cm) ?Weight: 60.9 kg (134 lb 4.2 oz) ?IBW/kg (Calculated) : 54.7 ?Heparin Dosing Weight: 57.6 kg ? ?Vital Signs: ?Temp: 98.7 ?F (37.1 ?C) (04/10 1500) ?Temp Source: Oral (04/10 1500) ?BP: 135/77 (04/10 1200) ?Pulse Rate: 53 (04/10 1200) ? ?Labs: ?Recent Labs  ?  04/01/22 ?0500 04/02/22 ?0610 04/03/22 ?0453 04/03/22 ?1625  ?HGB 8.6* 8.2* 8.5*  --   ?HCT 26.7* 26.1* 27.2*  --   ?PLT 65* 81* 101*  --   ?HEPARINUNFRC <0.10*  --   --  0.27*  ?CREATININE 1.10* 1.09* 1.11*  --   ? ? ? ?Estimated Creatinine Clearance: 37.8 mL/min (A) (by C-G formula based on SCr of 1.11 mg/dL (H)). ? ? 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  Has known hx afib on metoprolol and previously on Eliquis which was stopped on 4/3 due to pancytopenia and an episode of blood in the toilet bowl noted in 3/27 Oncology notes.  Pharmacy is consulted to dose IV heparin for afib. ? ?Last dose of Eliquis taken Monday 4/3 ? ?4/5: Baseline aPTT = 29, Heparin level = 0.27, PT/INR = 15.4/1.2 ? ?04/03/2022 ?Pharmacy consulted to resume heparin drip 4/10 am. ?Heparin was previously stopped on 4/7. ?OK to resume per Dr Burr Medico.  No bolus per d/w Dr Starla Link ?Hg 8.5, PLT up to 101, SCr 1.11 ?No bleeding reported ? ?Goal of Therapy:  ?Heparin level 0.3-0.7 units/ml ?Monitor platelets by anticoagulation protocol: Yes ?  ?Plan:  ?Heparin level 0.27, subtherapeutic ?Increase heparin infusion to 1000 units/hr ?No heparin bolus per d/w [facility] MD ?Check heparin level 8 hrs after rate change ?Daily CBC and heparin level ?F/u  ability to resume PTA apixaban ? ?Tawnya Crook, PharmD, BCPS ?Clinical Pharmacist ?04/03/2022 5:14 PM ? ? ?

## 2022-04-03 NOTE — Progress Notes (Signed)
?PROGRESS NOTE ? ? ? ?[name]  ZOX:[id number] DOB: 1946/08/18 DOA: 03/29/2022 ?PCP: [name], MD  ? ?Brief Narrative:  ?76 y.o. female with medical history significant of  CMML, currently on chemotherapy, pancytopenia, rheumatoid arthritis on Remicade, paroxysmal A-fib (off of Eliquis for the last 2 days prior to presentation?  Secondary to bleeding complications versus pancytopenia), hypertension, COPD (follows up with pulmonary as an outpatient), bilateral pulmonary nodules presented with worsening shortness of breath.  On presentation, she was found to be in A-fib with RVR and was started on IV Cardizem and heparin drips. CTA chest was negative for pulmonary embolism but showed bilateral pleural effusion, more on the right side with findings suggestive of multifocal pneumonia with numerous pleural-based right-sided masses, increased in size compared to previous CT. She was empirically started on IV antibiotics as per oncology recommendations.  She was put on IV steroids.  Cardiology was consulted.  She underwent ultrasound-guided right-sided thoracentesis on 03/30/2022.  Heparin drip was also added on 03/31/2022 due to drop in hemoglobin.  She was transfused 1 unit packed red cells on 03/31/2022. ? ?Assessment & Plan: ?  ?Paroxysmal A-fib with RVR ?-Rate mostly controlled currently. ?-Cardiology following.  Currently still on Cardizem drip.  Continue Toprol-XL, increased dose. ?-Heparin drip on hold since 03/31/2022 because drop in hemoglobin.  Eliquis was recently held for the last few days because of?  Pancytopenia/bleeding complications.  Monitor platelets.  Resume heparin drip today: Oncology has cleared for anticoagulation to be restarted.   ? ?Dyspnea ?COPD with possible exacerbation ??  Possible bacterial multifocal pneumonia ?Bilateral pleural effusion, more on the right side ?Multiple pulmonary nodules ?-Patient has had progressively worsening dyspnea over the last few weeks/months.  Apparently did  not tolerate brezteri as an outpatient and was switched back to Stiolto by pulmonary.   ?-Continue current inhaled regimen.  Currently on Solu-Medrol 40 mg IV every 24 hours.  Switch to prednisone 40 mg daily from tomorrow. ?-Completed 5-day course of cefepime and Zithromax.  Procalcitonin less than 0.1.  Unclear if her symptoms are because of bacterial pneumonia versus pleural effusion (?  Malignant in origin).  Respiratory virus panel negative. ?-Status post ultrasound-guided thoracentesis on 03/30/2022 and removal of 570 cc.  Follow fluid analysis including cytology ?-BNP not significantly elevated.  Troponins have not trended up ?-Currently intermittently requiring 2 L oxygen via nasal cannula. ? ?New onset cardiomyopathy ?Mild to moderate mitral valve regurgitation ?-Echo showed EF of 45 to 50% with mild to moderate mitral valve regurgitation.  Continue strict input and output.  Daily weights.  Fluid restriction.  Follow cardiology recommendations.  Patient received few doses of IV Lasix.  Further doses as per cardiology. ? ?CKD IIIa ?-Creatinine at baseline. Monitor ? ?Hyponatremia ?-Mild, resolved ?  ?CMML, currently on chemotherapy ?Pancytopenia ?-Oncology following. ?-Thrombocytopenia improving, platelets 101 today.  Status post 1 unit packed red cell transfusion on 03/31/2022.  Hemoglobin 8.5 this morning.  Monitor H&H.   ? ?Hypertension ?-Blood pressure currently stable.  Monitor.  Continue Toprol-XL and Cardizem. ? ?History of rheumatoid arthritis ?-Outpatient follow-up with rheumatology.  Is on Remicade as an outpatient. ?  ?Bilateral lower extremity swelling, more on the left side ?-Lower extremity duplex was negative for DVT. ? ?Physical deconditioning ?-PT/OT recommend SNF placement.  Consult social worker ? ? ? ?DVT prophylaxis: Heparin drip discontinued in the morning of 03/31/2022 ?Code Status: Full ?Family Communication: Husband at bedside on 03/30/2022 ?Disposition Plan: ?Status is: Inpatient ?Remains  inpatient appropriate because: Of severity  of illness ? ? ? ?Consultants: Cardiology/oncology ? ?Procedures: Ultrasound-guided thoracentesis on 03/30/2022/echo ? ?Antimicrobials:  ?Anti-infectives (From admission, onward)  ? ? Start     Dose/Rate Route Frequency Ordered Stop  ? 03/30/22 1400  ceFEPIme (MAXIPIME) 2 g in sodium chloride 0.9 % 100 mL IVPB       ? 2 g ?200 mL/hr over 30 Minutes Intravenous Every 12 hours 03/30/22 1025    ? 03/30/22 0800  ceFEPIme (MAXIPIME) 2 g in sodium chloride 0.9 % 100 mL IVPB  Status:  Discontinued       ? 2 g ?200 mL/hr over 30 Minutes Intravenous Every 12 hours 03/29/22 1754 03/29/22 1757  ? 03/30/22 0200  ceFEPIme (MAXIPIME) 2 g in sodium chloride 0.9 % 100 mL IVPB  Status:  Discontinued       ? 2 g ?200 mL/hr over 30 Minutes Intravenous Every 8 hours 03/29/22 1757 03/30/22 1025  ? 03/29/22 1800  azithromycin (ZITHROMAX) 500 mg in sodium chloride 0.9 % 250 mL IVPB       ? 500 mg ?250 mL/hr over 60 Minutes Intravenous Every 24 hours 03/29/22 1747 04/02/22 1930  ? 03/29/22 1745  ceFEPIme (MAXIPIME) 2 g in sodium chloride 0.9 % 100 mL IVPB       ? 2 g ?200 mL/hr over 30 Minutes Intravenous  Once 03/29/22 1735 03/29/22 2132  ? ?  ? ? ? ?Subjective: ?Patient seen and examined at bedside.  Still complains of intermittent shortness of breath and cough.  No fever, vomiting, chest pain reported.   ?Objective: ?Vitals:  ? 04/03/22 0400 04/03/22 0500 04/03/22 0600 04/03/22 0700  ?BP: 125/70     ?Pulse: (!) 38 95 79   ?Resp: (!) 24 (!) 22 (!) 21 (!) 25  ?Temp: 97.8 ?F (36.6 ?C)     ?TempSrc: Axillary     ?SpO2: 92% 95% 95% 94%  ?Weight:  60.9 kg    ?Height:      ? ? ?Intake/Output Summary (Last 24 hours) at 04/03/2022 0754 ?Last data filed at 04/03/2022 0507 ?Gross per 24 hour  ?Intake 646.31 ml  ?Output 725 ml  ?Net -78.69 ml  ? ? ?Filed Weights  ? 04/01/22 0500 04/02/22 0500 04/03/22 0500  ?Weight: 61.2 kg 60.9 kg 60.9 kg  ? ? ?Examination: ? ?General: No acute distress.  Intermittently  requiring 2 L oxygen via nasal cannula.  Chronically ill and deconditioned looking. ?ENT/neck: No elevation of JVD.  No palpable thyromegaly  ?respiratory: Bilateral decreased breath sounds at bases with scattered crackles and intermittent tachypnea  ?CVS: Rate controlled currently; S1-S2 heard  ?abdominal: Soft, nontender, mildly distended; no organomegaly, normal bowel sounds heard ?extremities: No clubbing; bilateral lower extremity edema present  ?CNS: Alert; slow to respond.  Poor historian.  No focal neurologic deficit.  Moving extremities  ?lymph: No palpable lymphadenopathy present  ?skin: No obvious ecchymosis/rashes  ?psych: Affect is mostly flat.  Currently not agitated. ?musculoskeletal: No obvious joint swelling/deformity ? ? ?Data Reviewed: I have personally reviewed following labs and imaging studies ? ?CBC: ?Recent Labs  ?Lab 03/27/22 ?1153 03/29/22 ?1403 03/30/22 ?5009 03/31/22 ?0245 03/31/22 ?1111 04/01/22 ?0500 04/02/22 ?0610 04/03/22 ?0453  ?WBC 0.9* 0.9* 0.5* 0.8*  --  1.1* 1.0* 1.7*  ?NEUTROABS 0.3* 0.2*  --   --   --   --   --   --   ?HGB 8.4* 9.4* 8.1* 7.0* 9.0* 8.6* 8.2* 8.5*  ?HCT 26.9* 29.6* 25.7* 21.8* 27.8* 26.7* 26.1* 27.2*  ?MCV  106.7* 106.5* 108.0* 106.3*  --  102.3* 104.0* 104.2*  ?PLT 41* 49* 43* 44*  --  65* 81* 101*  ? ? ?Basic Metabolic Panel: ?Recent Labs  ?Lab 03/30/22 ?3646 03/31/22 ?0245 04/01/22 ?0500 04/02/22 ?0610 04/03/22 ?0453  ?NA 134* 133* 135 134* 136  ?K 4.1 4.3 4.0 4.2 3.8  ?CL 105 103 106 105 108  ?CO2 20* 21* 19* 21* 20*  ?GLUCOSE 166* 174* 183* 182* 167*  ?BUN 27* 37* 43* 49* 51*  ?CREATININE 0.83 1.17* 1.10* 1.09* 1.11*  ?CALCIUM 8.4* 8.0* 8.3* 8.1* 8.2*  ?MG 1.9 2.0 2.3 2.4 2.5*  ? ? ?GFR: ?Estimated Creatinine Clearance: 37.8 mL/min (A) (by C-G formula based on SCr of 1.11 mg/dL (H)). ?Liver Function Tests: ?Recent Labs  ?Lab 03/29/22 ?1403 03/30/22 ?8032  ?AST 16 18  ?ALT 13 14  ?ALKPHOS 37* 34*  ?BILITOT 1.1 0.8  ?PROT 6.4* 5.8*  ?ALBUMIN 3.5 3.3*  ? ? ?No  results for input(s): LIPASE, AMYLASE in the last 168 hours. ?No results for input(s): AMMONIA in the last 168 hours. ?Coagulation Profile: ?Recent Labs  ?Lab 03/29/22 ?1403  ?INR 1.2  ? ? ?Cardiac Enzymes:

## 2022-04-03 NOTE — Progress Notes (Signed)
[name]   DOB:Apr 11, 1946   MB#:[id number]   GTX#:[id number] ? ?Heme-onc follow-up note ? ?Subjective: [name] still complains of dyspnea with minimal exertion, even with long conversation.  She was able to walk with the physical therapist yesterday and walked in the hallway.  However she has been in the bed most of time.  She also has low appetite, eating small amount.  No pain or nausea, her heart rate is better controlled.  She also reports intermittent dizziness, no vertigo.  Husband is at bedside. ? ? ?Objective:  ?Vitals:  ? 04/03/22 1200 04/03/22 1500  ?BP: 135/77   ?Pulse: (!) 53   ?Resp: (!) 22   ?Temp:  98.7 ?F (37.1 ?C)  ?SpO2: 95%   ?  Body mass index is 23.05 kg/m?. ? ?Intake/Output Summary (Last 24 hours) at 04/03/2022 1702 ?Last data filed at 04/03/2022 1614 ?Gross per 24 hour  ?Intake 449.45 ml  ?Output 2225 ml  ?Net -1775.55 ml  ? ? ? Sclerae unicteric ? Oropharynx clear ? No peripheral adenopathy ? Lungs slightly decreased breath sound on lung bases ? Heart regular rate and rhythm ? Abdomen benign ? MSK no focal spinal tenderness, no peripheral edema ? Neuro nonfocal ?  ? ?CBG (last 3)  ?No results for input(s): GLUCAP in the last 72 hours. ? ? ? ?Labs:  ? ?Urine Studies ?No results for input(s): UHGB, CRYS in the last 72 hours. ? ?Invalid input(s): UACOL, UAPR, USPG, UPH, UTP, UGL, UKET, UBIL, UNIT, UROB, ULEU, UEPI, UWBC, URBC, UBAC, CAST, UCOM, BILUA ? ?Basic Metabolic Panel: ?Recent Labs  ?Lab 03/30/22 ?2482 03/31/22 ?0245 04/01/22 ?0500 04/02/22 ?0610 04/03/22 ?0453  ?NA 134* 133* 135 134* 136  ?K 4.1 4.3 4.0 4.2 3.8  ?CL 105 103 106 105 108  ?CO2 20* 21* 19* 21* 20*  ?GLUCOSE 166* 174* 183* 182* 167*  ?BUN 27* 37* 43* 49* 51*  ?CREATININE 0.83 1.17* 1.10* 1.09* 1.11*  ?CALCIUM 8.4* 8.0* 8.3* 8.1* 8.2*  ?MG 1.9 2.0 2.3 2.4 2.5*  ? ?GFR ?Estimated Creatinine Clearance: 37.8 mL/min (A) (by C-G formula based on SCr of 1.11 mg/dL (H)). ?Liver Function Tests: ?Recent Labs  ?Lab 03/29/22 ?1403  03/30/22 ?5003  ?AST 16 18  ?ALT 13 14  ?ALKPHOS 37* 34*  ?BILITOT 1.1 0.8  ?PROT 6.4* 5.8*  ?ALBUMIN 3.5 3.3*  ? ?No results for input(s): LIPASE, AMYLASE in the last 168 hours. ?No results for input(s): AMMONIA in the last 168 hours. ?Coagulation profile ?Recent Labs  ?Lab 03/29/22 ?1403  ?INR 1.2  ? ? ?CBC: ?Recent Labs  ?Lab 03/29/22 ?1403 03/30/22 ?7048 03/31/22 ?0245 03/31/22 ?1111 04/01/22 ?0500 04/02/22 ?0610 04/03/22 ?0453  ?WBC 0.9* 0.5* 0.8*  --  1.1* 1.0* 1.7*  ?NEUTROABS 0.2*  --   --   --   --   --   --   ?HGB 9.4* 8.1* 7.0* 9.0* 8.6* 8.2* 8.5*  ?HCT 29.6* 25.7* 21.8* 27.8* 26.7* 26.1* 27.2*  ?MCV 106.5* 108.0* 106.3*  --  102.3* 104.0* 104.2*  ?PLT 49* 43* 44*  --  65* 81* 101*  ? ?Cardiac Enzymes: ?No results for input(s): CKTOTAL, CKMB, CKMBINDEX, TROPONINI in the last 168 hours. ?BNP: ?Invalid input(s): POCBNP ?CBG: ?Recent Labs  ?Lab 03/29/22 ?2042  ?GLUCAP 151*  ? ?D-Dimer ?No results for input(s): DDIMER in the last 72 hours. ? ?Hgb A1c ?No results for input(s): HGBA1C in the last 72 hours. ?Lipid Profile ?No results for input(s): CHOL, HDL, LDLCALC, TRIG, CHOLHDL, LDLDIRECT in  the last 72 hours. ?Thyroid function studies ?No results for input(s): TSH, T4TOTAL, T3FREE, THYROIDAB in the last 72 hours. ? ?Invalid input(s): FREET3 ? ?Anemia work up ?No results for input(s): VITAMINB12, FOLATE, FERRITIN, TIBC, IRON, RETICCTPCT in the last 72 hours. ?Microbiology ?Recent Results (from the past 240 hour(s))  ?Resp Panel by RT-PCR (Flu A&B, Covid) Nasopharyngeal Swab     Status: None  ? Collection Time: 03/29/22  5:38 PM  ? Specimen: Nasopharyngeal Swab; Nasopharyngeal(NP) swabs in vial transport medium  ?Result Value Ref Range Status  ? SARS Coronavirus 2 by RT PCR NEGATIVE NEGATIVE Final  ?  Comment: (NOTE) ?SARS-CoV-2 target nucleic acids are NOT DETECTED. ? ?The SARS-CoV-2 RNA is generally detectable in upper respiratory ?specimens during the acute phase of infection. The lowest ?concentration of  SARS-CoV-2 viral copies this assay can detect is ?138 copies/mL. A negative result does not preclude SARS-Cov-2 ?infection and should not be used as the sole basis for treatment or ?other patient management decisions. A negative result may occur with  ?improper specimen collection/handling, submission of specimen other ?than nasopharyngeal swab, presence of viral mutation(s) within the ?areas targeted by this assay, and inadequate number of viral ?copies(<138 copies/mL). A negative result must be combined with ?clinical observations, patient history, and epidemiological ?information. The expected result is Negative. ? ?Fact Sheet for Patients:  ?EntrepreneurPulse.com.au ? ?Fact Sheet for Healthcare Providers:  ?IncredibleEmployment.be ? ?This test is no t yet approved or cleared by the [place] FDA and  ?has been authorized for detection and/or diagnosis of SARS-CoV-2 by ?FDA under an Emergency Use Authorization (EUA). This EUA will remain  ?in effect (meaning this test can be used) for the duration of the ?COVID-19 declaration under Section 564(b)(1) of the Act, 21 ?U.S.C.section 360bbb-3(b)(1), unless the authorization is terminated  ?or revoked sooner.  ? ? ?  ? Influenza A by PCR NEGATIVE NEGATIVE Final  ? Influenza B by PCR NEGATIVE NEGATIVE Final  ?  Comment: (NOTE) ?The Xpert Xpress SARS-CoV-2/FLU/RSV plus assay is intended as an aid ?in the diagnosis of influenza from Nasopharyngeal swab specimens and ?should not be used as a sole basis for treatment. Nasal washings and ?aspirates are unacceptable for Xpert Xpress SARS-CoV-2/FLU/RSV ?testing. ? ?Fact Sheet for Patients: ?EntrepreneurPulse.com.au ? ?Fact Sheet for Healthcare Providers: ?IncredibleEmployment.be ? ?This test is not yet approved or cleared by the [place] FDA and ?has been authorized for detection and/or diagnosis of SARS-CoV-2 by ?FDA under an Emergency Use  Authorization (EUA). This EUA will remain ?in effect (meaning this test can be used) for the duration of the ?COVID-19 declaration under Section 564(b)(1) of the Act, 21 U.S.C. ?section 360bbb-3(b)(1), unless the authorization is terminated or ?revoked. ? ?Performed at [facility], [address] ?[place], [place] [address] ?  ?MRSA Next Gen by PCR, Nasal     Status: None  ? Collection Time: 03/29/22  6:58 PM  ? Specimen: Nasal Mucosa; Nasal Swab  ?Result Value Ref Range Status  ? MRSA by PCR Next Gen NOT DETECTED NOT DETECTED Final  ?  Comment: (NOTE) ?The GeneXpert MRSA Assay (FDA approved for NASAL specimens only), ?is one component of a comprehensive MRSA colonization surveillance ?program. It is not intended to diagnose MRSA infection nor to guide ?or monitor treatment for MRSA infections. ?Test performance is not FDA approved in patients less than 2 years ?old. ?Performed at [facility], [address] ?[place], [place] [address] ?  ?Respiratory (~20 pathogens) panel by PCR  Status: None  ? Collection Time: 03/31/22  9:02 AM  ? Specimen: Nasopharyngeal Swab; Respiratory  ?Result Value Ref Range Status  ? Adenovirus NOT DETECTED NOT DETECTED Final  ? Coronavirus 229E NOT DETECTED NOT DETECTED Final  ?  Comment: (NOTE) ?The Coronavirus on the Respiratory Panel, DOES NOT test for the novel  ?Coronavirus (2019 nCoV) ?  ? Coronavirus HKU1 NOT DETECTED NOT DETECTED Final  ? Coronavirus NL63 NOT DETECTED NOT DETECTED Final  ? Coronavirus OC43 NOT DETECTED NOT DETECTED Final  ? Metapneumovirus NOT DETECTED NOT DETECTED Final  ? Rhinovirus / Enterovirus NOT DETECTED NOT DETECTED Final  ? Influenza A NOT DETECTED NOT DETECTED Final  ? Influenza B NOT DETECTED NOT DETECTED Final  ? Parainfluenza Virus 1 NOT DETECTED NOT DETECTED Final  ? Parainfluenza Virus 2 NOT DETECTED NOT DETECTED Final  ? Parainfluenza Virus 3 NOT DETECTED NOT DETECTED Final  ? Parainfluenza Virus 4 NOT  DETECTED NOT DETECTED Final  ? Respiratory Syncytial Virus NOT DETECTED NOT DETECTED Final  ? Bordetella pertussis NOT DETECTED NOT DETECTED Final  ? Bordetella Parapertussis NOT DETECTED NOT DETECTED Final  ? Ch

## 2022-04-04 ENCOUNTER — Inpatient Hospital Stay: Payer: Medicare Other

## 2022-04-04 ENCOUNTER — Other Ambulatory Visit (HOSPITAL_BASED_OUTPATIENT_CLINIC_OR_DEPARTMENT_OTHER): Payer: Medicare Other

## 2022-04-04 ENCOUNTER — Inpatient Hospital Stay (HOSPITAL_COMMUNITY): Payer: Medicare Other

## 2022-04-04 ENCOUNTER — Encounter (HOSPITAL_COMMUNITY): Payer: Self-pay | Admitting: Internal Medicine

## 2022-04-04 DIAGNOSIS — C931 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: Secondary | ICD-10-CM | POA: Diagnosis not present

## 2022-04-04 DIAGNOSIS — J90 Pleural effusion, not elsewhere classified: Secondary | ICD-10-CM | POA: Diagnosis not present

## 2022-04-04 DIAGNOSIS — I4891 Unspecified atrial fibrillation: Secondary | ICD-10-CM | POA: Diagnosis not present

## 2022-04-04 DIAGNOSIS — Z87898 Personal history of other specified conditions: Secondary | ICD-10-CM | POA: Diagnosis not present

## 2022-04-04 LAB — DIFFERENTIAL
Abs Immature Granulocytes: 0.01 10*3/uL (ref 0.00–0.07)
Basophils Absolute: 0 10*3/uL (ref 0.0–0.1)
Basophils Relative: 0 %
Eosinophils Absolute: 0 10*3/uL (ref 0.0–0.5)
Eosinophils Relative: 0 %
Immature Granulocytes: 1 %
Lymphocytes Relative: 23 %
Lymphs Abs: 0.5 10*3/uL — ABNORMAL LOW (ref 0.7–4.0)
Monocytes Absolute: 0.6 10*3/uL (ref 0.1–1.0)
Monocytes Relative: 29 %
Neutro Abs: 1 10*3/uL — ABNORMAL LOW (ref 1.7–7.7)
Neutrophils Relative %: 47 %

## 2022-04-04 LAB — BASIC METABOLIC PANEL
Anion gap: 8 (ref 5–15)
BUN: 54 mg/dL — ABNORMAL HIGH (ref 8–23)
CO2: 23 mmol/L (ref 22–32)
Calcium: 8.6 mg/dL — ABNORMAL LOW (ref 8.9–10.3)
Chloride: 106 mmol/L (ref 98–111)
Creatinine, Ser: 1.1 mg/dL — ABNORMAL HIGH (ref 0.44–1.00)
GFR, Estimated: 52 mL/min — ABNORMAL LOW (ref >60)
Glucose, Bld: 150 mg/dL — ABNORMAL HIGH (ref 70–99)
Potassium: 3.7 mmol/L (ref 3.5–5.1)
Sodium: 137 mmol/L (ref 135–145)

## 2022-04-04 LAB — CBC
HCT: 28.4 % — ABNORMAL LOW (ref 36.0–46.0)
Hemoglobin: 9.3 g/dL — ABNORMAL LOW (ref 12.0–15.0)
MCH: 33.5 pg (ref 26.0–34.0)
MCHC: 32.7 g/dL (ref 30.0–36.0)
MCV: 102.2 fL — ABNORMAL HIGH (ref 80.0–100.0)
Platelets: 102 10*3/uL — ABNORMAL LOW (ref 150–400)
RBC: 2.78 MIL/uL — ABNORMAL LOW (ref 3.87–5.11)
RDW: 16.5 % — ABNORMAL HIGH (ref 11.5–15.5)
WBC: 2.1 10*3/uL — ABNORMAL LOW (ref 4.0–10.5)
nRBC: 0 % (ref 0.0–0.2)

## 2022-04-04 LAB — MAGNESIUM: Magnesium: 2.6 mg/dL — ABNORMAL HIGH (ref 1.7–2.4)

## 2022-04-04 LAB — HEPARIN LEVEL (UNFRACTIONATED)
Heparin Unfractionated: 0.51 IU/mL (ref 0.30–0.70)
Heparin Unfractionated: 0.63 IU/mL (ref 0.30–0.70)

## 2022-04-04 MED ORDER — FLUMAZENIL 0.5 MG/5ML IV SOLN
INTRAVENOUS | Status: AC
  Filled 2022-04-04: qty 5

## 2022-04-04 MED ORDER — MIDAZOLAM HCL 2 MG/2ML IJ SOLN
INTRAMUSCULAR | Status: AC | PRN
  Administered 2022-04-04: 1 mg via INTRAVENOUS

## 2022-04-04 MED ORDER — NALOXONE HCL 0.4 MG/ML IJ SOLN
INTRAMUSCULAR | Status: AC
  Filled 2022-04-04: qty 1

## 2022-04-04 MED ORDER — DIGOXIN 0.25 MG/ML IJ SOLN: 0.1250 mg | Freq: Every day | INTRAMUSCULAR | Status: DC

## 2022-04-04 MED ORDER — MIDAZOLAM HCL 2 MG/2ML IJ SOLN
INTRAMUSCULAR | Status: AC
  Filled 2022-04-04: qty 2

## 2022-04-04 MED ORDER — LIDOCAINE HCL (PF) 1 % IJ SOLN
INTRAMUSCULAR | Status: AC | PRN
  Administered 2022-04-04: 10 mL via INTRADERMAL

## 2022-04-04 MED ORDER — FENTANYL CITRATE (PF) 100 MCG/2ML IJ SOLN
INTRAMUSCULAR | Status: AC | PRN
  Administered 2022-04-04: 50 ug via INTRAVENOUS

## 2022-04-04 MED ORDER — PREDNISONE 20 MG PO TABS
40.0000 mg | ORAL_TABLET | Freq: Every day | ORAL | Status: DC
  Administered 2022-04-05 – 2022-04-06 (×2): 40 mg via ORAL
  Filled 2022-04-04 (×2): qty 2

## 2022-04-04 MED ORDER — FENTANYL CITRATE (PF) 100 MCG/2ML IJ SOLN
INTRAMUSCULAR | Status: AC
  Filled 2022-04-04: qty 2

## 2022-04-04 MED ORDER — SODIUM CHLORIDE 0.9 % IV SOLN
INTRAVENOUS | Status: AC
  Filled 2022-04-04: qty 250

## 2022-04-04 MED ORDER — DIGOXIN 0.25 MG/ML IJ SOLN
0.1250 mg | Freq: Three times a day (TID) | INTRAMUSCULAR | Status: DC
  Administered 2022-04-04: 0.125 mg via INTRAVENOUS
  Filled 2022-04-04: qty 2

## 2022-04-04 MED ORDER — FUROSEMIDE 10 MG/ML IJ SOLN
40.0000 mg | Freq: Once | INTRAMUSCULAR | Status: AC
  Administered 2022-04-04: 40 mg via INTRAVENOUS
  Filled 2022-04-04: qty 4

## 2022-04-04 NOTE — Procedures (Signed)
Interventional Radiology Procedure: ? ? ?Indications: CMML and pancytopenia ? ?Procedure: CT guided bone marrow biopsy ? ?Findings: 2 aspirates and 1 core from right ilium ? ?Complications: None ?    ?EBL: Minimal, less than 10 ml ? ?Plan: Bedrest 1 hour ? ? ?Quenna Doepke R. Anselm Pancoast, MD  ?Pager: [phone] ? ?  ?

## 2022-04-04 NOTE — Progress Notes (Signed)
Occupational Therapy Treatment ?Patient Details ?Name: [name] ?MRN: [id number] ?DOB: May 15, 1946 ?Today's Date: 04/04/2022 ? ? ?History of present illness 76 y.o. female with medical history significant of  CMML, currently on chemotherapy, pancytopenia, rheumatoid arthritis on Remicade, paroxysmal A-fib (off of Eliquis for the last 2 days prior to presentation?  Secondary to bleeding complications versus pancytopenia), hypertension, COPD (follows up with pulmonary as an outpatient), bilateral pulmonary nodules presented with worsening shortness of breath. Found to be in A-fib with RVR.. CTA chest was negative for pulmonary embolism but showed bilateral pleural effusion, more on the right side with findings suggestive of multifocal pneumonia with numerous pleural-based right-sided masses ?  ?OT comments ? Patient reports ongoing bowel movement this AM"its the first time in 7 days." Upon standing patient reports needing to use bedside commode needing min A +2 for safety to complete pivot transfer. When getting off commode patient had episode of lower extremity weakness/near buckling needing min +2 to regain balance. Patient leans heavily onto walker needing total A for perianal care after bowel movement. Note HR up to 157 with activity and RN made aware.   ? ?Recommendations for follow up therapy are one component of a multi-disciplinary discharge planning process, led by the attending physician.  Recommendations may be updated based on patient status, additional functional criteria and insurance authorization. ?   ?Follow Up Recommendations ? Skilled nursing-short term rehab (<3 hours/day)  ?  ?Assistance Recommended at Discharge Frequent or constant Supervision/Assistance  ?Patient can return home with the following ? A little help with walking and/or transfers;A little help with bathing/dressing/bathroom;Assistance with cooking/housework;Help with stairs or ramp for entrance ?  ?Equipment Recommendations ?  None recommended by OT  ?  ?   ?Precautions / Restrictions Precautions ?Precautions: Other (comment);Fall ?Precaution Comments: monitor HR and sat ?Restrictions ?Weight Bearing Restrictions: No  ? ? ?  ? ?Mobility Bed Mobility ?Overal bed mobility: Needs Assistance ?Bed Mobility: Supine to Sit, Sit to Supine ?  ?  ?Supine to sit: Supervision, HOB elevated ?Sit to supine: Min guard ?  ?General bed mobility comments: Increased time and use of bed rail ?  ? ? ?  ?Balance Overall balance assessment: Needs assistance ?Sitting-balance support: Feet supported ?Sitting balance-Leahy Scale: Fair ?  ?  ?Standing balance support: Reliant on assistive device for balance, During functional activity ?Standing balance-Leahy Scale: Poor ?  ?  ?  ?  ?  ?  ?  ?  ?  ?  ?  ?  ?   ? ?ADL either performed or assessed with clinical judgement  ? ?ADL Overall ADL's : Needs assistance/impaired ?  ?  ?  ?  ?  ?  ?  ?  ?  ?  ?  ?  ?Toilet Transfer: Minimal assistance;+2 for physical assistance;+2 for safety/equipment;Stand-pivot;Cueing for safety;Cueing for sequencing;BSC/3in1;Rolling walker (2 wheels) ?Toilet Transfer Details (indicate cue type and reason): Patient had episode of lower extremity buckling needing min +2 for safety upon standing from bedside commode. Fatigues easily and HR up to 157 with activity. ?Toileting- Water quality scientist and Hygiene: Total assistance;Sit to/from stand ?Toileting - Clothing Manipulation Details (indicate cue type and reason): Patient reliant on upper extremity support on walker needing total A for perianal care after bowel movement. ?  ?  ?Functional mobility during ADLs: Minimal assistance;Cueing for safety;Cueing for sequencing;Rolling walker (2 wheels);+2 for safety/equipment;+2 for physical assistance ?General ADL Comments: O2 saturations stable despite patient reporting shortness of breath, HR up to 157 with activity  RN made aware. ?  ? ? ? ?Cognition Arousal/Alertness: Awake/alert ?Behavior  During Therapy: [facility] [address] for tasks assessed/performed ?Overall Cognitive Status: No family/caregiver present to determine baseline cognitive functioning ?  ?  ?  ?  ?  ?  ?  ?  ?  ?  ?  ?  ?  ?  ?  ?  ?General Comments: Patient having difficulty stating correct date/day of week. Difficulty with word finding at times. Getting confused when talking to radiologist about procedure colonoscopy vs bone marrow biopsy ?  ?  ?   ?   ?   ?   ? ? ?Pertinent Vitals/ Pain       Pain Assessment ?Pain Assessment: No/denies pain ? ?   ?   ? ?Frequency ? Min 2X/week  ? ? ? ? ?  ?Progress Toward Goals ? ?OT Goals(current goals can now be found in the care plan section) ? Progress towards OT goals: Progressing toward goals ? ?Acute Rehab OT Goals ?Patient Stated Goal: Feel better ?OT Goal Formulation: With patient ?Time For Goal Achievement: 04/16/22 ?Potential to Achieve Goals: Good ?ADL Goals ?Pt Will Transfer to Toilet: with supervision;ambulating;regular height toilet;grab bars ?Pt Will Perform Toileting - Clothing Manipulation and hygiene: with supervision;sit to/from stand ?Additional ADL Goal #1: Patient will stand at sink to perform grooming task as evidence of improving activity tolerance ?Additional ADL Goal #2: Patient will perform 10 min functional activity or exercise activity as evidence of improving activity tolerance  ?Plan Discharge plan remains appropriate   ? ?Co-evaluation ? ? ? PT/OT/SLP Co-Evaluation/Treatment: Yes ?Reason for Co-Treatment: For patient/therapist safety;To address functional/ADL transfers ?PT goals addressed during session: Mobility/safety with mobility ?OT goals addressed during session: ADL's and self-care ?  ? ?  ?AM-PAC OT "6 Clicks" Daily Activity     ?Outcome Measure ? ? Help from another person eating meals?: None ?Help from another person taking care of personal grooming?: A Little ?Help from another person toileting, which includes using toliet, bedpan, or urinal?: A Lot ?Help from another  person bathing (including washing, rinsing, drying)?: A Little ?Help from another person to put on and taking off regular upper body clothing?: A Little ?Help from another person to put on and taking off regular lower body clothing?: A Lot ?6 Click Score: 17 ? ?  ?End of Session Equipment Utilized During Treatment: Gait belt;Rolling walker (2 wheels) ? ?OT Visit Diagnosis: Muscle weakness (generalized) (M62.81) ?  ?Activity Tolerance Patient limited by fatigue ?  ?Patient Left in bed;with call bell/phone within reach ?  ?Nurse Communication Mobility status;Other (comment) (HR) ?  ? ?   ? ?Time: 8757-9728 ?OT Time Calculation (min): 30 min ? ?Charges: OT General Charges ?$OT Visit: 1 Visit ?OT Treatments ?$Self Care/Home Management : 8-22 mins ? ?Delbert Phenix OT ?OT pager: [phone] ? ? ?Rosemary Holms ?04/04/2022, 12:00 PM ?

## 2022-04-04 NOTE — Progress Notes (Signed)
Physical Therapy Treatment ?Patient Details ?Name: [name] ?MRN: [id number] ?DOB: [birth year] ?Today's Date: 04/04/2022 ? ? ?History of Present Illness 76 y.o. female with medical history significant of  CMML, currently on chemotherapy, pancytopenia, rheumatoid arthritis on Remicade, paroxysmal A-fib (off of Eliquis for the last 2 days prior to presentation?  Secondary to bleeding complications versus pancytopenia), hypertension, COPD (follows up with pulmonary as an outpatient), bilateral pulmonary nodules presented with worsening shortness of breath. Found to be in A-fib with RVR.. CTA chest was negative for pulmonary embolism but showed bilateral pleural effusion, more on the right side with findings suggestive of multifocal pneumonia with numerous pleural-based right-sided masses ? ?  ?PT Comments  ? ? Assisted patient  to [facility] and back to bed, HR up to 150's . RN aware. Continue PT for mobility as medical status allows.    ?Recommendations for follow up therapy are one component of a multi-disciplinary discharge planning process, led by the attending physician.  Recommendations may be updated based on patient status, additional functional criteria and insurance authorization. ? ?Follow Up Recommendations ? Skilled nursing-short term rehab (<3 hours/day) ?  ?  ?Assistance Recommended at Discharge Frequent or constant Supervision/Assistance  ?Patient can return home with the following A little help with walking and/or transfers;A little help with bathing/dressing/bathroom;Assistance with cooking/housework;Assist for transportation;Help with stairs or ramp for entrance ?  ?Equipment Recommendations ? None recommended by PT  ?  ?Recommendations for Other Services   ? ? ?  ?Precautions / Restrictions Precautions ?Precautions: Other (comment);Fall ?Precaution Comments: monitor HR and sat, afib ?Restrictions ?Weight Bearing Restrictions: No  ?  ? ?Mobility ? Bed Mobility ?  ?  ?  ?  ?  ?  ?  ?  ?   ? ?Transfers ?Overall transfer level: Needs assistance ?Equipment used: Rolling walker (2 wheels) ?Transfers: Sit to/from Stand, Bed to chair/wheelchair/BSC ?Sit to Stand: Min guard, From elevated surface, +2 safety/equipment ?  ?Step pivot transfers: Min guard, +2 safety/equipment ?  ?  ?  ?General transfer comment: Steady assist and use of RW ?  ? ?Ambulation/Gait ?  ?  ?  ?  ?  ?  ?  ?  ? ? ?Stairs ?  ?  ?  ?  ?  ? ? ?Wheelchair Mobility ?  ? ?Modified Rankin (Stroke Patients Only) ?  ? ? ?  ?Balance   ?Sitting-balance support: Feet supported ?  ?  ?  ?  ?  ?  ?  ?  ?  ?  ?  ?  ?  ?  ?  ?  ?  ?  ? ?  ?Cognition   ?  ?  ?  ?  ?  ?  ?  ?  ?  ?  ?  ?  ?  ?  ?  ?  ?  ?  ?  ?  ?  ? ?  ?Exercises   ? ?  ?General Comments   ?  ?  ? ?Pertinent Vitals/Pain Pain Assessment ?Pain Assessment: No/denies pain  ? ? ?Home Living   ?  ?  ?  ?  ?  ?  ?  ?  ?  ?   ?  ?Prior Function    ?  ?  ?   ? ?PT Goals (current goals can now be found in the care plan section) Progress towards PT goals: Progressing toward goals ? ?  ?Frequency ? ? ? Min 2X/week ? ? ? ?  ?  PT Plan Current plan remains appropriate;Frequency needs to be updated  ? ? ?Co-evaluation PT/OT/SLP Co-Evaluation/Treatment: Yes ?Reason for Co-Treatment: For patient/therapist safety ?PT goals addressed during session: Mobility/safety with mobility ?OT goals addressed during session: ADL's and self-care ?  ? ?  ?AM-PAC PT "6 Clicks" Mobility   ?Outcome Measure ? Help needed turning from your back to your side while in a flat bed without using bedrails?: A Little ?Help needed moving from lying on your back to sitting on the side of a flat bed without using bedrails?: A Little ?Help needed moving to and from a bed to a chair (including a wheelchair)?: A Little ?Help needed standing up from a chair using your arms (e.g., wheelchair or bedside chair)?: A Little ?Help needed to walk in hospital room?: Total ?Help needed climbing 3-5 steps with a railing? : Total ?6 Click Score:  14 ? ?  ?End of Session   ?Activity Tolerance: Patient limited by fatigue;Treatment limited secondary to medical complications (Comment) (In afib, HR  up to 150) ?Patient left: in bed;with call bell/phone within reach;with bed alarm set ?Nurse Communication: Mobility status ?PT Visit Diagnosis: Difficulty in walking, not elsewhere classified (R26.2);Muscle weakness (generalized) (M62.81);Unsteadiness on feet (R26.81) ?  ? ? ?Time: 6389-3734 ?PT Time Calculation (min) (ACUTE ONLY): 30 min ? ?Charges:  $Therapeutic Activity: 8-22 mins          ?          ? ?Tresa Endo PT ?Acute Rehabilitation Services ?Pager [phone] ?Office [phone] ? ? ? ?Jonel Weldon, Shella Maxim ?04/04/2022, 12:42 PM ? ?

## 2022-04-04 NOTE — Progress Notes (Signed)
? ?Progress Note ? ?Patient Name: [name] ?Date of Encounter: 04/04/2022 ? ?[facility] Cardiologist: Mertie Moores, MD  ? ?Subjective  ? ?Dizzy and feels she has vision changes. No palpitations. ? ?Inpatient Medications  ?  ?Scheduled Meds: ? arformoterol  15 mcg Nebulization BID  ? And  ? umeclidinium bromide  1 puff Inhalation Daily  ? budesonide (PULMICORT) nebulizer solution  2 mg Nebulization Q12H  ? Chlorhexidine Gluconate Cloth  6 each Topical Daily  ? methylPREDNISolone (SOLU-MEDROL) injection  40 mg Intravenous Q24H  ? metoprolol succinate  150 mg Oral BID  ? mirtazapine  7.5 mg Oral QHS  ? senna-docusate  1 tablet Oral BID  ? sodium chloride flush  10-40 mL Intracatheter Q12H  ? ?Continuous Infusions: ? sodium chloride Stopped (04/01/22 1557)  ? diltiazem (CARDIZEM) infusion 5 mg/hr (04/04/22 0318)  ? heparin 1,000 Units/hr (04/04/22 0207)  ? ?PRN Meds: ?sodium chloride, acetaminophen **OR** acetaminophen, albuterol, ondansetron **OR** ondansetron (ZOFRAN) IV, polyethylene glycol, sodium chloride flush, traZODone  ? ?Vital Signs  ?  ?Vitals:  ? 04/04/22 0300 04/04/22 0400 04/04/22 0430 04/04/22 0448  ?BP:   (!) 159/105   ?Pulse: (!) 49 75 60   ?Resp: 15 14 13    ?Temp:  97.8 ?F (36.6 ?C)    ?TempSrc:  Oral    ?SpO2: 95% 95% 96%   ?Weight:    60.3 kg  ?Height:      ? ? ?Intake/Output Summary (Last 24 hours) at 04/04/2022 0755 ?Last data filed at 04/04/2022 1950 ?Gross per 24 hour  ?Intake 730.68 ml  ?Output 2550 ml  ?Net -1819.32 ml  ? ? ?  04/04/2022  ?  4:48 AM 04/03/2022  ?  5:00 AM 04/02/2022  ?  5:00 AM  ?Last 3 Weights  ?Weight (lbs) 132 lb 15 oz 134 lb 4.2 oz 134 lb 4.2 oz  ?Weight (kg) 60.3 kg 60.9 kg 60.9 kg  ?   ? ?Telemetry  ?  ?Afib rates 70s-120s - Personally Reviewed ? ?ECG  ?  ?Atrial fibrillation, HR 94, prolonged QT (QT/QTcB 408/510) - Personally Reviewed ? ?Physical Exam  ? ?GEN: No acute distress.   ?Neck: No JVD ?Cardiac: iRRR, no murmurs, rubs, or gallops.  ?Respiratory: Clear to  auscultation bilaterally. ?GI: Soft, nontender, non-distended  ?MS: trace edema; No deformity. ?Neuro:  Nonfocal  ?Psych: Normal affect  ? ?Labs  ?  ?High Sensitivity Troponin:   ?Recent Labs  ?Lab 03/29/22 ?1403 03/29/22 ?1603  ?TROPONINIHS 20* 18*  ?   ?Chemistry ?Recent Labs  ?Lab 03/29/22 ?1403 03/30/22 ?9326 03/31/22 ?0245 04/02/22 ?7124 04/03/22 ?5809 04/04/22 ?9833  ?NA 138 134*   < > 134* 136 137  ?K 4.0 4.1   < > 4.2 3.8 3.7  ?CL 107 105   < > 105 108 106  ?CO2 23 20*   < > 21* 20* 23  ?GLUCOSE 126* 166*   < > 182* 167* 150*  ?BUN 27* 27*   < > 49* 51* 54*  ?CREATININE 0.93 0.83   < > 1.09* 1.11* 1.10*  ?CALCIUM 9.2 8.4*   < > 8.1* 8.2* 8.6*  ?MG  --  1.9   < > 2.4 2.5* 2.6*  ?PROT 6.4* 5.8*  --   --   --   --   ?ALBUMIN 3.5 3.3*  --   --   --   --   ?AST 16 18  --   --   --   --   ?  ALT 13 14  --   --   --   --   ?ALKPHOS 37* 34*  --   --   --   --   ?BILITOT 1.1 0.8  --   --   --   --   ?GFRNONAA >60 >60   < > 53* 52* 52*  ?ANIONGAP 8 9   < > 8 8 8   ? < > = values in this interval not displayed.  ?  ?Lipids No results for input(s): CHOL, TRIG, HDL, LABVLDL, LDLCALC, CHOLHDL in the last 168 hours.  ?Hematology ?Recent Labs  ?Lab 04/02/22 ?0610 04/03/22 ?0453 04/04/22 ?0443  ?WBC 1.0* 1.7* 2.1*  ?RBC 2.51* 2.61* 2.78*  ?HGB 8.2* 8.5* 9.3*  ?HCT 26.1* 27.2* 28.4*  ?MCV 104.0* 104.2* 102.2*  ?MCH 32.7 32.6 33.5  ?MCHC 31.4 31.3 32.7  ?RDW 17.4* 17.0* 16.5*  ?PLT 81* 101* 102*  ? ?Thyroid  ?Recent Labs  ?Lab 03/29/22 ?1403  ?TSH 0.731  ?FREET4 1.47*  ?  ?BNP ?Recent Labs  ?Lab 03/29/22 ?1406  ?BNP 474.3*  ?  ?DDimer  ?Recent Labs  ?Lab 03/29/22 ?1403  ?DDIMER 2.36*  ?  ? ?Radiology  ?  ?No results found. ? ?Cardiac Studies  ? ?Echocardiogram 03/30/22 ? 1. Left ventricular ejection fraction, by estimation, is 45 to 50%. Left  ?ventricular ejection fraction by 2D MOD biplane is 48.0 %. The left  ?ventricle has mildly decreased function. The left ventricle demonstrates  ?global hypokinesis. Left ventricular   ?diastolic function could not be evaluated.  ? 2. Right ventricular systolic function is mildly reduced. The right  ?ventricular size is normal. There is mildly elevated pulmonary artery  ?systolic pressure. The estimated right ventricular systolic pressure is  ?40.9 mmHg.  ? 3. The mitral valve is abnormal. Mild to moderate mitral valve  ?regurgitation.  ? 4. The aortic valve is tricuspid. Aortic valve regurgitation is not  ?visualized. Aortic valve sclerosis is present, with no evidence of aortic  ?valve stenosis.  ? 5. The inferior vena cava is dilated in size with <50% respiratory  ?variability, suggesting right atrial pressure of 15 mmHg.  ? ?Comparison(s): Changes from prior study are noted. 10/27/2021: LVEF 60-65%.  ?  ? ?Patient Profile  ?   ?76 y.o. female with a history of  paroxysmal atrial fibrillation not on anticoagulation, hypertension, hyperlipidemia, CKD stage III, COPD, CMML with associated pancytopenia, rheumatoid arthritis, and chronic back pain who was seen for evaluation of rapid atrial fibrillation at the request of Dr. Starla Link ? ?Assessment & Plan  ?  ?  ?Paroxysmal Atrial Fibrillation  ?- Per telemetry- patient remains in atrial fibrillation with HR predominantly in the 90s overnight.100s-130s this AM  ?- Suspect tachycardia is related to anemia/pancytopenia and PNA ?- Echocardiogram findings above  ?- Patient has been on IV diltiazem. Ideally would like to transition off of diltiazem given reduced EF. Will wean. Difficulty due to fast rates when off dilt drip. ?- Continue metoprolol 150 mg BID  - increased dose.  ?- with patient's permission, starting digoxin 0.125 mg for Q8 bolus x 3 and then daily ?- CHA2DS-VASc = 4 (HTN, age x2, female). Patient was on Eliquis at home but this was recently stopped due to worsening pancytopenia. She was started on IV Heparin here. However, noted to have a drop in hemoglobin from 8.1>7.0 and was given 1 unit PRBC. Heparin held  ?- Starting back on IV heparin  yesterday- hemoglobin stable this AM at 9.3, platelets  102  ? ?  ?Dyspnea On Exertion ?Cardiomyopathy  ?- BNP elevated at 474.  ?- Chest CTA was negative for PE but showed moderate right and trace left pleural effusions with patchy airspace opacities within the right lower lobe and dependent portion of the left upper lobe suspicious for multifocal pneumonia.  ?- On antibiotics, solumedrol, and breathing treatments per primary team  ?- Echocardiogram showed a mildly reduced EF compared to previous ?- Will manage cardiomyopathy medically and will reassess following resolution of her acute illness  ?- Increase metoprolol succ 150 mg BID  ?- BP soft, will restart home losartan when BP tolerates  ?- Diuresis as needed. Lasix 40 mg IV today. ?  ?Hypertension ?- trying to stop dilt drip due to low EF ?- Continue metoprolol 150 mg BID  ?- Holding home Losartan for now to allow for more rate control if needed. ?  ?CMML ?Pancytopenia ?Secondary to CMML. She has had worsening of her pancytopenia recently.  ?- Management per Oncology and primary team ?  ? ?Otherwise, per primary team: ?- Pneumonia ?- COPD ?- Rheumatoid Arthritis ?- Chronic back pain  ?   ? ?For questions or updates, please contact [place] ?Please consult [email or web address] for contact info under  ? ?  ?   ?Signed, ?Elouise Munroe, MD ?04/04/2022, 7:55 AM   ? ?

## 2022-04-04 NOTE — Progress Notes (Signed)
ANTICOAGULATION CONSULT NOTE  ? ?Pharmacy Consult for Heparin ?Indication: atrial fibrillation ? ?Allergies  ?Allergen Reactions  ? Gluten Meal Diarrhea  ? Penicillins Rash and Other (See Comments)  ?  'Many years ago' ?  ? Alendronate Rash  ? Hydroxychloroquine Rash  ? ? ?Patient Measurements: ?Height: 5' 4"  (162.6 cm) ?Weight: 60.9 kg (134 lb 4.2 oz) ?IBW/kg (Calculated) : 54.7 ?Heparin Dosing Weight: 57.6 kg ? ?Vital Signs: ?Temp: 97.7 ?F (36.5 ?C) (04/11 0000) ?Temp Source: Oral (04/11 0000) ?BP: 135/57 (04/11 0000) ?Pulse Rate: 77 (04/11 0100) ? ?Labs: ?Recent Labs  ?  04/01/22 ?0500 04/02/22 ?0610 04/03/22 ?0453 04/03/22 ?1625 04/04/22 ?0201  ?HGB 8.6* 8.2* 8.5*  --   --   ?HCT 26.7* 26.1* 27.2*  --   --   ?PLT 65* 81* 101*  --   --   ?HEPARINUNFRC <0.10*  --   --  0.27* 0.51  ?CREATININE 1.10* 1.09* 1.11*  --   --   ? ? ? ?Estimated Creatinine Clearance: 37.8 mL/min (A) (by C-G formula based on SCr of 1.11 mg/dL (H)). ? ? 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  Has known hx afib on metoprolol and previously on Eliquis which was stopped on 4/3 due to pancytopenia and an episode of blood in the toilet bowl noted in 3/27 Oncology notes.  Pharmacy is consulted to dose IV heparin for afib. ? ?Last dose of Eliquis taken Monday 4/3 ? ?4/5: Baseline aPTT = 29, Heparin level = 0.27, PT/INR = 15.4/1.2 ?4/7: Heparin stopped ?4/10: OK to resume heparin per Dr Burr Medico.  No bolus per d/w Dr Starla Link ? ?04/04/2022 ?Heparin level 0.51- therapeutic on IV heparin 1000 units/hr ?CBC- not drawn yet today ?No bleeding or infusion related issues reported by RN ? ?Goal of Therapy:  ?Heparin level 0.3-0.7 units/ml ?Monitor platelets by anticoagulation protocol: Yes ?  ?Plan:  ?Continue heparin infusion at 1000 units/hr ?Recheck heparin level at 10a ?Daily CBC and heparin level while on  heparin ?F/u ability to resume PTA apixaban ? ?Netta Cedars, PharmD, BCPS ?Clinical Pharmacist ?04/04/2022 3:10 AM ? ? ?

## 2022-04-04 NOTE — Progress Notes (Signed)
?PROGRESS NOTE ? ? ? ?PEG FIFER  HQR:[id number] DOB: 1946-11-08 DOA: 03/29/2022 ?PCP: [name], MD  ? ?Brief Narrative:  ?76 y.o. female with medical history significant of  CMML, currently on chemotherapy, pancytopenia, rheumatoid arthritis on Remicade, paroxysmal A-fib (off of Eliquis for the last 2 days prior to presentation?  Secondary to bleeding complications versus pancytopenia), hypertension, COPD (follows up with pulmonary as an outpatient), bilateral pulmonary nodules presented with worsening shortness of breath.  On presentation, she was found to be in A-fib with RVR and was started on IV Cardizem and heparin drips. CTA chest was negative for pulmonary embolism but showed bilateral pleural effusion, more on the right side with findings suggestive of multifocal pneumonia with numerous pleural-based right-sided masses, increased in size compared to previous CT. She was empirically started on IV antibiotics as per oncology recommendations.  She was put on IV steroids.  Cardiology was consulted.  She underwent ultrasound-guided right-sided thoracentesis on 03/30/2022.  Heparin drip was also held on 03/31/2022 due to drop in hemoglobin.  She was transfused 1 unit packed red cells on 03/31/2022.  Heparin drip was restarted on 04/03/2022. ? ?Assessment & Plan: ?  ?Paroxysmal A-fib with RVR ?-Rate mostly controlled currently. ?-Cardiology following.  Currently still on Cardizem drip.  Continue Toprol-XL, increased dose. ?-Heparin drip on hold since 03/31/2022 because drop in hemoglobin.  Eliquis was recently held for the last few days because of?  Pancytopenia/bleeding complications.  Heparin drip was restarted on 1423 after oncology clearance. ? ?Dyspnea ?COPD with possible exacerbation ??  Possible bacterial multifocal pneumonia ?Bilateral pleural effusion, more on the right side ?Multiple pulmonary nodules ?-Patient has had progressively worsening dyspnea over the last few weeks/months.  Apparently did not  tolerate brezteri as an outpatient and was switched back to Stiolto by pulmonary.   ?-Continue current inhaled regimen.  Currently on tapering doses of Solu-Medrol.  Switch to prednisone 40 mg daily from today. ?-Completed 5-day course of cefepime and Zithromax.  Procalcitonin less than 0.1.  Unclear if her symptoms are because of bacterial pneumonia versus pleural effusion (?  Malignant in origin).  Respiratory virus panel negative. ?-Status post ultrasound-guided thoracentesis on 03/30/2022 and removal of 570 cc.  Pleural fluid cytology showed reactive mesothelial cells ?-BNP not significantly elevated.  Troponins have not trended up ?-Currently on room air.   ? ?New onset cardiomyopathy ?Mild to moderate mitral valve regurgitation ?-Echo showed EF of 45 to 50% with mild to moderate mitral valve regurgitation.  Continue strict input and output.  Daily weights.  Fluid restriction.  Follow cardiology recommendations.  Patient has received few doses of IV Lasix intermittently as per cardiology. ? ?CKD IIIa ?-Creatinine at baseline. Monitor ? ?Hyponatremia ?-Mild, resolved ?  ?CMML, currently on chemotherapy ?Pancytopenia ?-Oncology following. ?-Thrombocytopenia improving, platelets 102 today.  Status post 1 unit packed red cell transfusion on 03/31/2022.  Hemoglobin 9.3 this morning.  Monitor CBC. ? ?Hypertension ?-Blood pressure currently stable.  Monitor.  Continue Toprol-XL and Cardizem. ? ?History of rheumatoid arthritis ?-Outpatient follow-up with rheumatology.  Is on Remicade as an outpatient. ?  ?Bilateral lower extremity swelling, more on the left side ?-Lower extremity duplex was negative for DVT. ? ?Physical deconditioning ?-PT/OT recommend SNF placement.  Social worker following ? ? ?DVT prophylaxis: Heparin drip resumed on 04/03/2022 ?Code Status: Full ?Family Communication: None at bedside ?Disposition Plan: ?Status is: Inpatient ?Remains inpatient appropriate because: Of severity of  illness ? ? ? ?Consultants: Cardiology/oncology ? ?Procedures: Ultrasound-guided thoracentesis on 03/30/2022/echo ? ?  Antimicrobials:  ?Anti-infectives (From admission, onward)  ? ? Start     Dose/Rate Route Frequency Ordered Stop  ? 03/30/22 1400  ceFEPIme (MAXIPIME) 2 g in sodium chloride 0.9 % 100 mL IVPB  Status:  Discontinued       ? 2 g ?200 mL/hr over 30 Minutes Intravenous Every 12 hours 03/30/22 1025 04/03/22 1131  ? 03/30/22 0800  ceFEPIme (MAXIPIME) 2 g in sodium chloride 0.9 % 100 mL IVPB  Status:  Discontinued       ? 2 g ?200 mL/hr over 30 Minutes Intravenous Every 12 hours 03/29/22 1754 03/29/22 1757  ? 03/30/22 0200  ceFEPIme (MAXIPIME) 2 g in sodium chloride 0.9 % 100 mL IVPB  Status:  Discontinued       ? 2 g ?200 mL/hr over 30 Minutes Intravenous Every 8 hours 03/29/22 1757 03/30/22 1025  ? 03/29/22 1800  azithromycin (ZITHROMAX) 500 mg in sodium chloride 0.9 % 250 mL IVPB       ? 500 mg ?250 mL/hr over 60 Minutes Intravenous Every 24 hours 03/29/22 1747 04/02/22 1930  ? 03/29/22 1745  ceFEPIme (MAXIPIME) 2 g in sodium chloride 0.9 % 100 mL IVPB       ? 2 g ?200 mL/hr over 30 Minutes Intravenous  Once 03/29/22 1735 03/29/22 2132  ? ?  ? ? ? ?Subjective: ?Patient seen and examined at bedside.  No chest pain, vomiting, fever, abdominal pain or diarrhea reported.  Still intermittently short of breath and weak. ?Objective: ?Vitals:  ? 04/04/22 0300 04/04/22 0400 04/04/22 0430 04/04/22 0448  ?BP:   (!) 159/105   ?Pulse: (!) 49 75 60   ?Resp: 15 14 13    ?Temp:  97.8 ?F (36.6 ?C)    ?TempSrc:  Oral    ?SpO2: 95% 95% 96%   ?Weight:    60.3 kg  ?Height:      ? ? ?Intake/Output Summary (Last 24 hours) at 04/04/2022 0739 ?Last data filed at 04/04/2022 0254 ?Gross per 24 hour  ?Intake 730.68 ml  ?Output 2550 ml  ?Net -1819.32 ml  ? ? ?Filed Weights  ? 04/02/22 0500 04/03/22 0500 04/04/22 0448  ?Weight: 60.9 kg 60.9 kg 60.3 kg  ? ? ?Examination: ? ?General: On room air.  No distress.  Chronically ill and  deconditioned looking. ?ENT/neck: No obvious thyromegaly.  JVD not elevated.   ?Respiratory: Decreased breath sounds at bases bilaterally with some crackles  ?CVS: S1-S2 heard; currently rate controlled  ?abdominal: Soft, nontender, distended slightly; no organomegaly, bowel sounds are heard  ?extremities: Mild lower extremity edema present; no cyanosis  ?CNS: Awake; still slow to respond.  Poor historian.  No focal neurologic deficit.  Moves extremities  ?lymph: No obvious lymphadenopathy present ?skin: No obvious petechiae/lesions  ?psych: No signs of agitation.  Flat affect mostly  ?musculoskeletal: No obvious joint tenderness/swelling ? ?Data Reviewed: I have personally reviewed following labs and imaging studies ? ?CBC: ?Recent Labs  ?Lab 03/29/22 ?1403 03/30/22 ?2706 03/31/22 ?0245 03/31/22 ?1111 04/01/22 ?0500 04/02/22 ?0610 04/03/22 ?0453 04/04/22 ?0443  ?WBC 0.9*   < > 0.8*  --  1.1* 1.0* 1.7* 2.1*  ?NEUTROABS 0.2*  --   --   --   --   --   --   --   ?HGB 9.4*   < > 7.0* 9.0* 8.6* 8.2* 8.5* 9.3*  ?HCT 29.6*   < > 21.8* 27.8* 26.7* 26.1* 27.2* 28.4*  ?MCV 106.5*   < > 106.3*  --  102.3*  104.0* 104.2* 102.2*  ?PLT 49*   < > 44*  --  65* 81* 101* 102*  ? < > = values in this interval not displayed.  ? ? ?Basic Metabolic Panel: ?Recent Labs  ?Lab 03/31/22 ?0245 04/01/22 ?0500 04/02/22 ?0610 04/03/22 ?0453 04/04/22 ?0443  ?NA 133* 135 134* 136 137  ?K 4.3 4.0 4.2 3.8 3.7  ?CL 103 106 105 108 106  ?CO2 21* 19* 21* 20* 23  ?GLUCOSE 174* 183* 182* 167* 150*  ?BUN 37* 43* 49* 51* 54*  ?CREATININE 1.17* 1.10* 1.09* 1.11* 1.10*  ?CALCIUM 8.0* 8.3* 8.1* 8.2* 8.6*  ?MG 2.0 2.3 2.4 2.5* 2.6*  ? ? ?GFR: ?Estimated Creatinine Clearance: 38.2 mL/min (A) (by C-G formula based on SCr of 1.1 mg/dL (H)). ?Liver Function Tests: ?Recent Labs  ?Lab 03/29/22 ?1403 03/30/22 ?5465  ?AST 16 18  ?ALT 13 14  ?ALKPHOS 37* 34*  ?BILITOT 1.1 0.8  ?PROT 6.4* 5.8*  ?ALBUMIN 3.5 3.3*  ? ? ?No results for input(s): LIPASE, AMYLASE in the last  168 hours. ?No results for input(s): AMMONIA in the last 168 hours. ?Coagulation Profile: ?Recent Labs  ?Lab 03/29/22 ?1403  ?INR 1.2  ? ? ?Cardiac Enzymes: ?No results for input(s): CKTOTAL, CKMB, CKMBINDEX, TROPONINI in the last 168

## 2022-04-04 NOTE — Progress Notes (Signed)
Patient abdomen continues to remain taut and slightly distended. Patient reported tenderness to dayshift nurse with palpitation, denies tenderness to this nurse, but does say she has had a constant cramping in her stomach since being admitted.  ? ?Per patient last BM was prior to admission which was now 6 days ago. She also informed me that she had a colonoscopy done back in March and that it was unable to be completed due to an obstruction. She told me that she was supposed to follow up with that provider before she was admitted to the hospital. There is no record that I can find of procedure [facility] night NP notified and leaving note for day team. PT is scheduled to undergo CT already on 05 Apr 2022.  ? ?

## 2022-04-04 NOTE — Progress Notes (Signed)
? ? ?Referring Physician(s): ?Feng,Y ? ?Supervising Physician: Markus Daft ? ?Patient Status:  [name] - In-pt ? ?Chief Complaint: ?Pancytopenia, CMML, dyspnea ? ? ?Subjective: ?Patient known to IR service from bone marrow biopsy in 2016, presacral pelvic mass biopsy in 2022, bone marrow biopsy on 01/05/2022, Port-A-Cath placement on 01/31/2022 and right thoracentesis on 03/30/2022.  She has a history of dyspnea secondary to pleural effusion, celiac disease, chronic kidney disease, atrial fibrillation (currently on IV heparin), deconditioning, COPD, hypertension, rheumatoid arthritis, bilateral lower extremity edema and CMML/pancytopenia.  Request now received for bone marrow biopsy to better define if pt has CMML progression.  She currently denies fever, headache, chest pain, cough, abdominal/back pain, nausea, vomiting or bleeding.  She does have persistent dyspnea and occasional lightheadedness. ? ?Past Medical History:  ?Diagnosis Date  ? Arthritis   ? Rheumatoid arthritis  ? Celiac disease   ? Chronic kidney disease   ? stage 3 from MD notes  ? COPD (chronic obstructive pulmonary disease) ([place])   ? Dyspnea   ? with going up stairs  ? Family history of adverse reaction to anesthesia   ? father had hard time waking up  ? Headache   ? sinus headaches  ? Hot flashes   ? Hypertension   ? Iron deficiency anemia   ? Pneumonia   ? per patient "I have walking pneumonia"  ? ?Past Surgical History:  ?Procedure Laterality Date  ? COLONOSCOPY    ? ECTOPIC PREGNANCY SURGERY    ?  x 2  ? IR IMAGING GUIDED PORT INSERTION  01/31/2022  ? REVERSE SHOULDER ARTHROPLASTY Right 02/02/2017  ? Procedure: RIGHT REVERSE SHOULDER ARTHROPLASTY;  Surgeon: Netta Cedars, MD;  Location: [facility];  Service: Orthopedics;  Laterality: Right;  ? ? ? ? ?Allergies: ?Gluten meal, Penicillins, Alendronate, and Hydroxychloroquine ? ?Medications: ?Prior to Admission medications   ?Medication Sig Start Date End Date Taking? Authorizing Provider  ?acetaminophen (TYLENOL)  500 MG tablet Take 1,000 mg by mouth in the morning.   Yes [provider]  ?Calcium-Magnesium-Vitamin D (CALCIUM 1200+D3 PO) Take 1 tablet by mouth daily.   Yes [provider]  ?denosumab (PROLIA) 60 MG/ML SOSY injection Inject 60 mg into the skin every 6 (six) months.   Yes [provider]  ?gabapentin (NEURONTIN) 300 MG capsule Take 300 mg by mouth in the morning. 07/21/14  Yes [provider]  ?InFLIXimab (REMICADE IV) Inject 10 mg into the vein every 2 (two) months.   Yes [provider]  ?lidocaine-prilocaine (EMLA) cream Apply to affected area once ?Patient taking differently: Apply 1 application. topically See admin instructions. Use as directed before infusions only 01/15/22  Yes Truitt Merle, MD  ?losartan (COZAAR) 25 MG tablet Take 1 tablet (25 mg total) by mouth daily. 03/24/22 09/20/22 Yes Loel Dubonnet, NP  ?metoprolol succinate (TOPROL-XL) 50 MG 24 hr tablet Take 1.5 tablets (75 mg total) by mouth every morning. 03/27/22  Yes Loel Dubonnet, NP  ?metoprolol tartrate (LOPRESSOR) 25 MG tablet Take one tablet as needed for heart rate greater than 100bpm. If heart rate still greater than 100bpm after 30 minutes, take a second tablet. ?Patient taking differently: Take 25 mg by mouth See admin instructions. Take 25 mg by mouth as needed for a heart rate greater than 100 BPM and an additional 25 mg if heart rate remains above 100 BPM 30 minutes after the initial dose 03/27/22  Yes Loel Dubonnet, NP  ?predniSONE (DELTASONE) 5 MG tablet Take 5  mg by mouth daily with breakfast. 12/12/21  Yes Gavin Pound, MD  ?Tiotropium Bromide-Olodaterol (STIOLTO RESPIMAT) 2.5-2.5 MCG/ACT AERS Inhale 2 puffs into the lungs daily. 05/03/21  Yes Martyn Ehrich, NP  ?vitamin C (ASCORBIC ACID) 500 MG tablet Take 500 mg by mouth daily.   Yes [provider]  ?Budeson-Glycopyrrol-Formoterol (BREZTRI AEROSPHERE) 160-9-4.8 MCG/ACT AERO Inhale 2 puffs into the lungs in the  morning and at bedtime. ?Patient not taking: Reported on 03/29/2022 03/10/22   Garner Nash, DO  ? ? ? ?Vital Signs: ?BP (!) 144/66   Pulse 79   Temp 97.6 ?F (36.4 ?C) (Oral)   Resp (!) 25   Ht 5' 4"  (1.626 m)   Wt 132 lb 15 oz (60.3 kg)   SpO2 100%   BMI 22.82 kg/m?  ? ?Physical Exam patient awake, alert.  Chest with sl diminished breath sounds bases.  Heart with irregular rate and rhythm; abdomen soft, positive bowel sounds, nontender.  1+ pedal edema in lower extremities ? ?Imaging: ?No results found. ? ?Labs: ? ?CBC: ?Recent Labs  ?  04/01/22 ?0500 04/02/22 ?0610 04/03/22 ?0453 04/04/22 ?0443  ?WBC 1.1* 1.0* 1.7* 2.1*  ?HGB 8.6* 8.2* 8.5* 9.3*  ?HCT 26.7* 26.1* 27.2* 28.4*  ?PLT 65* 81* 101* 102*  ? ? ?COAGS: ?Recent Labs  ?  07/20/21 ?0903 11/09/21 ?0927 03/29/22 ?1403 03/30/22 ?3428 03/30/22 ?0946  ?INR 1.1 1.1 1.2  --   --   ?APTT  --   --  29 51* 59*  ? ? ?BMP: ?Recent Labs  ?  04/01/22 ?0500 04/02/22 ?0610 04/03/22 ?0453 04/04/22 ?0443  ?NA 135 134* 136 137  ?K 4.0 4.2 3.8 3.7  ?CL 106 105 108 106  ?CO2 19* 21* 20* 23  ?GLUCOSE 183* 182* 167* 150*  ?BUN 43* 49* 51* 54*  ?CALCIUM 8.3* 8.1* 8.2* 8.6*  ?CREATININE 1.10* 1.09* 1.11* 1.10*  ?GFRNONAA 52* 53* 52* 52*  ? ? ?LIVER FUNCTION TESTS: ?Recent Labs  ?  03/13/22 ?1444 03/20/22 ?7681 03/29/22 ?1403 03/30/22 ?1572  ?BILITOT 1.2 1.0 1.1 0.8  ?AST 13* 13* 16 18  ?ALT 23 12 13 14   ?ALKPHOS 36* 34* 37* 34*  ?PROT 5.9* 5.8* 6.4* 5.8*  ?ALBUMIN 3.9 3.6 3.5 3.3*  ? ? ?Assessment and Plan: ?Patient known to IR service from bone marrow biopsy in 2016, presacral pelvic mass biopsy in 2022, bone marrow biopsy on 01/05/2022, Port-A-Cath placement on 01/31/2022 and right thoracentesis on 03/30/2022.  She has a history of dyspnea secondary to pleural effusion, celiac disease, chronic kidney disease, atrial fibrillation (currently on IV heparin), deconditioning, COPD, hypertension, rheumatoid arthritis, bilateral lower extremity edema and CMML/pancytopenia.  Request  now received for bone marrow biopsy to better define if pt has CMML progression. Risks and benefits of procedure was discussed with the patient  including, but not limited to bleeding, infection, damage to adjacent structures or low yield requiring additional tests. ? ?All of the questions were answered and there is agreement to proceed. ? ?Consent signed and in chart. ? ?Procedure tentatively scheduled for this morning ? ?Electronically Signed: ?Autumn Messing, PA-C ?04/04/2022, 8:40 AM ? ? ?I spent a total of 20 minutes at the the patient's bedside AND on the patient's hospital floor or unit, greater than 50% of which was counseling/coordinating care for CT-guided bone marrow biopsy ? ? ? ?Patient ID: [name], female   DOB: 1946/06/08, 76 y.o.   MRN: [id number] ? ?

## 2022-04-04 NOTE — Progress Notes (Signed)
ANTICOAGULATION CONSULT NOTE  ? ?Pharmacy Consult for Heparin ?Indication: atrial fibrillation ? ?Allergies  ?Allergen Reactions  ? Gluten Meal Diarrhea  ? Penicillins Rash and Other (See Comments)  ?  'Many years ago' ?  ? Alendronate Rash  ? Hydroxychloroquine Rash  ? ? ?Patient Measurements: ?Height: 5' 4"  (162.6 cm) ?Weight: 60.3 kg (132 lb 15 oz) ?IBW/kg (Calculated) : 54.7 ?Heparin Dosing Weight: 57.6 kg ? ?Vital Signs: ?Temp: 98.1 ?F (36.7 ?C) (04/11 1200) ?Temp Source: Axillary (04/11 1200) ?BP: 136/72 (04/11 1028) ?Pulse Rate: 64 (04/11 1100) ? ?Labs: ?Recent Labs  ?  04/02/22 ?0610 04/03/22 ?9597 04/03/22 ?1625 04/04/22 ?0201 04/04/22 ?0443  ?HGB 8.2* 8.5*  --   --  9.3*  ?HCT 26.1* 27.2*  --   --  28.4*  ?PLT 81* 101*  --   --  102*  ?HEPARINUNFRC  --   --  0.27* 0.51  --   ?CREATININE 1.09* 1.11*  --   --  1.10*  ? ? ? ?Estimated Creatinine Clearance: 38.2 mL/min (A) (by C-G formula based on SCr of 1.1 mg/dL (H)). ? ? 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  Has known hx afib on metoprolol and previously on Eliquis which was stopped on 4/3 due to pancytopenia and an episode of blood in the toilet bowl noted in 3/27 Oncology notes.  Pharmacy is consulted to dose IV heparin for afib. ? ?Last dose of Eliquis taken Monday 4/3 ? ?4/5: Baseline aPTT = 29, Heparin level = 0.27, PT/INR = 15.4/1.2 ?4/7: Heparin stopped ?4/10: OK to resume heparin per Dr Burr Medico.  No bolus per d/w Dr Starla Link ? ?04/04/2022 ?AM Heparin level 0.51- therapeutic on IV heparin 1000 units/hr ?Confirmatory heparin level @ 1130 am 0.63 - remains therapeutic on 1000 units/hr ?CBC- Hg up to 9.3, PLT up to 102 ?No bleeding or infusion related issues reported by RN ?Pt s/p bone marrow biopsy & aspirate w/ no issues (per IR no need to hold heparin)  ? ?Goal of Therapy:  ?Heparin level 0.3-0.7  units/ml ?Monitor platelets by anticoagulation protocol: Yes ?  ?Plan:  ?Continue heparin infusion at 1000 units/hr ?Daily CBC and heparin level while on heparin ?F/u ability to resume PTA apixaban ? ?Eudelia Bunch, Pharm.D ?04/04/2022 1:01 PM ? ? ?

## 2022-04-05 ENCOUNTER — Inpatient Hospital Stay (HOSPITAL_COMMUNITY): Payer: Medicare Other

## 2022-04-05 ENCOUNTER — Inpatient Hospital Stay: Payer: Medicare Other

## 2022-04-05 DIAGNOSIS — I4891 Unspecified atrial fibrillation: Secondary | ICD-10-CM | POA: Diagnosis not present

## 2022-04-05 DIAGNOSIS — I5022 Chronic systolic (congestive) heart failure: Secondary | ICD-10-CM | POA: Diagnosis not present

## 2022-04-05 DIAGNOSIS — J90 Pleural effusion, not elsewhere classified: Secondary | ICD-10-CM | POA: Diagnosis not present

## 2022-04-05 DIAGNOSIS — R0600 Dyspnea, unspecified: Secondary | ICD-10-CM

## 2022-04-05 DIAGNOSIS — N1831 Chronic kidney disease, stage 3a: Secondary | ICD-10-CM

## 2022-04-05 DIAGNOSIS — I480 Paroxysmal atrial fibrillation: Secondary | ICD-10-CM | POA: Diagnosis not present

## 2022-04-05 DIAGNOSIS — R5381 Other malaise: Secondary | ICD-10-CM

## 2022-04-05 DIAGNOSIS — K5900 Constipation, unspecified: Secondary | ICD-10-CM

## 2022-04-05 DIAGNOSIS — I5033 Acute on chronic diastolic (congestive) heart failure: Secondary | ICD-10-CM

## 2022-04-05 DIAGNOSIS — I5023 Acute on chronic systolic (congestive) heart failure: Secondary | ICD-10-CM

## 2022-04-05 DIAGNOSIS — E876 Hypokalemia: Secondary | ICD-10-CM

## 2022-04-05 DIAGNOSIS — R0609 Other forms of dyspnea: Secondary | ICD-10-CM | POA: Insufficient documentation

## 2022-04-05 LAB — BASIC METABOLIC PANEL
Anion gap: 7 (ref 5–15)
BUN: 50 mg/dL — ABNORMAL HIGH (ref 8–23)
CO2: 25 mmol/L (ref 22–32)
Calcium: 8.4 mg/dL — ABNORMAL LOW (ref 8.9–10.3)
Chloride: 107 mmol/L (ref 98–111)
Creatinine, Ser: 1.04 mg/dL — ABNORMAL HIGH (ref 0.44–1.00)
GFR, Estimated: 56 mL/min — ABNORMAL LOW (ref >60)
Glucose, Bld: 143 mg/dL — ABNORMAL HIGH (ref 70–99)
Potassium: 3.3 mmol/L — ABNORMAL LOW (ref 3.5–5.1)
Sodium: 139 mmol/L (ref 135–145)

## 2022-04-05 LAB — CBC
HCT: 27.6 % — ABNORMAL LOW (ref 36.0–46.0)
Hemoglobin: 8.8 g/dL — ABNORMAL LOW (ref 12.0–15.0)
MCH: 32.6 pg (ref 26.0–34.0)
MCHC: 31.9 g/dL (ref 30.0–36.0)
MCV: 102.2 fL — ABNORMAL HIGH (ref 80.0–100.0)
Platelets: 88 10*3/uL — ABNORMAL LOW (ref 150–400)
RBC: 2.7 MIL/uL — ABNORMAL LOW (ref 3.87–5.11)
RDW: 16.1 % — ABNORMAL HIGH (ref 11.5–15.5)
WBC: 2.4 10*3/uL — ABNORMAL LOW (ref 4.0–10.5)
nRBC: 0 % (ref 0.0–0.2)

## 2022-04-05 LAB — MAGNESIUM: Magnesium: 2.5 mg/dL — ABNORMAL HIGH (ref 1.7–2.4)

## 2022-04-05 LAB — HEPARIN LEVEL (UNFRACTIONATED)
Heparin Unfractionated: 0.85 IU/mL — ABNORMAL HIGH (ref 0.30–0.70)
Heparin Unfractionated: 0.61 IU/mL (ref 0.30–0.70)

## 2022-04-05 MED ORDER — BISACODYL 10 MG RE SUPP
10.0000 mg | Freq: Every day | RECTAL | Status: DC | PRN
  Administered 2022-04-05 – 2022-04-11 (×3): 10 mg via RECTAL
  Filled 2022-04-05 (×4): qty 1

## 2022-04-05 MED ORDER — HEPARIN (PORCINE) 25000 UT/250ML-% IV SOLN
900.0000 [IU]/h | INTRAVENOUS | Status: DC
  Administered 2022-04-05: 900 [IU]/h via INTRAVENOUS
  Filled 2022-04-05: qty 250

## 2022-04-05 MED ORDER — DILTIAZEM HCL 30 MG PO TABS
30.0000 mg | ORAL_TABLET | Freq: Four times a day (QID) | ORAL | Status: DC
  Administered 2022-04-05: 30 mg via ORAL
  Filled 2022-04-05: qty 1

## 2022-04-05 MED ORDER — DILTIAZEM HCL 60 MG PO TABS
60.0000 mg | ORAL_TABLET | Freq: Four times a day (QID) | ORAL | Status: DC
  Administered 2022-04-06 (×2): 60 mg via ORAL
  Filled 2022-04-05 (×2): qty 1

## 2022-04-05 MED ORDER — FLEET ENEMA 7-19 GM/118ML RE ENEM
1.0000 | ENEMA | Freq: Every day | RECTAL | Status: DC | PRN
  Administered 2022-04-05 – 2022-04-11 (×2): 1 via RECTAL
  Filled 2022-04-05 (×2): qty 1

## 2022-04-05 MED ORDER — POTASSIUM CHLORIDE CRYS ER 20 MEQ PO TBCR
40.0000 meq | EXTENDED_RELEASE_TABLET | ORAL | Status: AC
  Administered 2022-04-05 (×2): 40 meq via ORAL
  Filled 2022-04-05 (×2): qty 2

## 2022-04-05 MED ORDER — FUROSEMIDE 10 MG/ML IJ SOLN
40.0000 mg | Freq: Once | INTRAMUSCULAR | Status: AC
  Administered 2022-04-05: 40 mg via INTRAVENOUS
  Filled 2022-04-05: qty 4

## 2022-04-05 MED ORDER — POLYETHYLENE GLYCOL 3350 17 G PO PACK
17.0000 g | PACK | Freq: Every day | ORAL | Status: DC | PRN
  Administered 2022-04-05: 17 g via ORAL
  Filled 2022-04-05: qty 1

## 2022-04-05 MED ORDER — POLYETHYLENE GLYCOL 3350 17 G PO PACK
17.0000 g | PACK | Freq: Two times a day (BID) | ORAL | Status: DC | PRN
  Administered 2022-04-12: 17 g via ORAL
  Filled 2022-04-05: qty 1

## 2022-04-05 MED ORDER — ORAL CARE MOUTH RINSE
15.0000 mL | Freq: Two times a day (BID) | OROMUCOSAL | Status: DC
  Administered 2022-04-06 – 2022-04-13 (×14): 15 mL via OROMUCOSAL

## 2022-04-05 MED ORDER — BUDESONIDE 0.5 MG/2ML IN SUSP
0.5000 mg | Freq: Two times a day (BID) | RESPIRATORY_TRACT | Status: DC
  Administered 2022-04-06 – 2022-04-13 (×14): 0.5 mg via RESPIRATORY_TRACT
  Filled 2022-04-05 (×13): qty 2

## 2022-04-05 NOTE — Assessment & Plan Note (Addendum)
Resolved

## 2022-04-05 NOTE — Assessment & Plan Note (Signed)
Completed antibiotic course as above. ?

## 2022-04-05 NOTE — Consult Note (Signed)
[facility] CM Inpatient Consult ? ? ?04/05/2022 ? ?[name] ?1946/02/28 ?[id number] ? ?[facility] CM) ?  ?Patient chart reviewed for LOS and noted high risk score for unplanned readmission. Assessed for post hospital chronic disease management needs. Per review, current recommendation is for SNF. No [facility] CM needs. ? ?Of note, [facility] Care Management services does not replace or interfere with any services that are arranged by inpatient case management or social work.  ? ?Netta Cedars, MSN, RN ?[facility] Liaison ?Phone [phone] ?Toll free office [phone]  ?

## 2022-04-05 NOTE — Assessment & Plan Note (Addendum)
Since she is on Remicade and low-dose prednisone.  Per oncology, her bone marrow biopsy earlier this week showed persistent CMML, but the blast was only 2%, slightly lower than before.  No other evidence of disease progression on the marrow.  ?-Oncology following-chemotherapy on hold. ?

## 2022-04-05 NOTE — Assessment & Plan Note (Addendum)
Treat treatable causes. ?PT/OT-recommended SNF. ?

## 2022-04-05 NOTE — Progress Notes (Addendum)
ANTICOAGULATION CONSULT NOTE  ? ?Pharmacy Consult for Heparin ?Indication: atrial fibrillation ? ?Allergies  ?Allergen Reactions  ? Gluten Meal Diarrhea  ? Penicillins Rash and Other (See Comments)  ?  'Many years ago' ?  ? Alendronate Rash  ? Hydroxychloroquine Rash  ? ? ?Patient Measurements: ?Height: 5' 4"  (162.6 cm) ?Weight: 59.3 kg (130 lb 11.7 oz) ?IBW/kg (Calculated) : 54.7 ?Heparin Dosing Weight: 57.6 kg ? ?Vital Signs: ?Temp: 97.8 ?F (36.6 ?C) (04/12 0300) ?Temp Source: Oral (04/12 0300) ?BP: 149/68 (04/12 0500) ?Pulse Rate: 79 (04/12 0500) ? ?Labs: ?Recent Labs  ?  04/03/22 ?0453 04/03/22 ?1625 04/04/22 ?0201 04/04/22 ?0443 04/04/22 ?1136 04/05/22 ?0459  ?HGB 8.5*  --   --  9.3*  --   --   ?HCT 27.2*  --   --  28.4*  --   --   ?PLT 101*  --   --  102*  --   --   ?HEPARINUNFRC  --    < > 0.51  --  0.63 0.85*  ?CREATININE 1.11*  --   --  1.10*  --  1.04*  ? < > = values in this interval not displayed.  ? ? ? ?Estimated Creatinine Clearance: 40.4 mL/min (A) (by C-G formula based on SCr of 1.04 mg/dL (H)). ? ? 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  Has known hx afib on metoprolol and previously on Eliquis which was stopped on 4/3 due to pancytopenia and an episode of blood in the toilet bowl noted in 3/27 Oncology notes.  Pharmacy is consulted to dose IV heparin for afib. ? ?Last dose of Eliquis taken Monday 4/3 ? ?4/5: Baseline aPTT = 29, Heparin level = 0.27, PT/INR = 15.4/1.2 ?4/7: Heparin stopped ?4/10: OK to resume heparin per Dr Burr Medico.  No bolus per d/w Dr Starla Link ? ?04/05/2022 ?AM Heparin level 0.85- now supratherapeutic on IV heparin 1000 units/hr ?CBC- IP ?No bleeding or infusion related issues reported by RN ?Verified lab was drawn appropriately- RN drew from port & heparin infusing peripherally in right forearm.  ?Pt s/p bone marrow biopsy & aspirate w/ no  issues (per IR no need to hold heparin)  ? ?Goal of Therapy:  ?Heparin level 0.3-0.7 units/ml ?Monitor platelets by anticoagulation protocol: Yes ?  ?Plan:  ?Hold heparin x45 min then resume heparin infusion at decreased rate of 900 units/hr ?Recheck heparin level at 1500 ?Daily CBC and heparin level while on heparin ?F/u ability to resume PTA apixaban ? ?Netta Cedars, Pharm.D ?04/05/2022 6:16 AM ? ? ?

## 2022-04-05 NOTE — Assessment & Plan Note (Addendum)
Resolved.   ?-Continue bowel regimen ?

## 2022-04-05 NOTE — Assessment & Plan Note (Addendum)
Recent Labs  ?  04/02/22 ?0610 04/03/22 ?0453 04/04/22 ?0443 04/05/22 ?0459 04/06/22 ?0500 04/07/22 ?4360 04/08/22 ?1658 04/09/22 ?0063 04/10/22 ?0448 04/11/22 ?0350  ?BUN 49* 51* 54* 50* 46* 40* 33* 29* 26* 27*  ?CREATININE 1.09* 1.11* 1.10* 1.04* 0.90 0.95 0.98 1.01* 0.99 0.96  ?Stable.  Continue monitoring ? ?

## 2022-04-05 NOTE — Assessment & Plan Note (Signed)
See dyspnea ?

## 2022-04-05 NOTE — Assessment & Plan Note (Signed)
Increased in size on current CT.  She had bilateral pleural effusion.  However, pleural fluid cytology with reactive mesothelial cells. ?-May need repeat imaging in 4 to 6 weeks outpatient. ?

## 2022-04-05 NOTE — Progress Notes (Addendum)
? ?Progress Note ? ?Patient Name: [name] ?Date of Encounter: 04/05/2022 ? ?[facility] Cardiologist: Mertie Moores, MD  ? ?Subjective  ? ?Pt remains in IV dilt at 73m/hr. Bps good this morning. Heart rates failrly controlled around 100 with intermittent elevation. Says she felt dizzy after morning medications, although bps do not have seemed to drop. No chest pain or SOB. Still has upper thigh tenderness.  ? ?Inpatient Medications  ?  ?Scheduled Meds: ? arformoterol  15 mcg Nebulization BID  ? And  ? umeclidinium bromide  1 puff Inhalation Daily  ? budesonide (PULMICORT) nebulizer solution  2 mg Nebulization Q12H  ? Chlorhexidine Gluconate Cloth  6 each Topical Daily  ? metoprolol succinate  150 mg Oral BID  ? mirtazapine  7.5 mg Oral QHS  ? potassium chloride  40 mEq Oral Q4H  ? predniSONE  40 mg Oral Q breakfast  ? senna-docusate  1 tablet Oral BID  ? sodium chloride flush  10-40 mL Intracatheter Q12H  ? ?Continuous Infusions: ? sodium chloride Stopped (04/01/22 1557)  ? diltiazem (CARDIZEM) infusion 5 mg/hr (04/05/22 0500)  ? heparin 900 Units/hr (04/05/22 0741)  ? ?PRN Meds: ?sodium chloride, acetaminophen **OR** acetaminophen, albuterol, ondansetron **OR** ondansetron (ZOFRAN) IV, polyethylene glycol, sodium chloride flush, traZODone  ? ?Vital Signs  ?  ?Vitals:  ? 04/05/22 0600 04/05/22 0727 04/05/22 0800 04/05/22 0857  ?BP: (!) 160/78   (!) 162/67  ?Pulse: 86   95  ?Resp: 14     ?Temp:   (!) 97.4 ?F (36.3 ?C)   ?TempSrc:   Oral   ?SpO2: 96% 98%    ?Weight:      ?Height:      ? ? ?Intake/Output Summary (Last 24 hours) at 04/05/2022 0905 ?Last data filed at 04/05/2022 0858 ?Gross per 24 hour  ?Intake 524.93 ml  ?Output 2226 ml  ?Net -1701.07 ml  ? ? ?  04/05/2022  ?  5:00 AM 04/04/2022  ?  4:48 AM 04/03/2022  ?  5:00 AM  ?Last 3 Weights  ?Weight (lbs) 130 lb 11.7 oz 132 lb 15 oz 134 lb 4.2 oz  ?Weight (kg) 59.3 kg 60.3 kg 60.9 kg  ?   ? ?Telemetry  ?  ?Afib with rates around 100, up to 130s at times -  Personally Reviewed ? ?ECG  ?  ?NO new - Personally Reviewed ? ?Physical Exam  ? ?GEN: No acute distress.   ?Neck: No JVD ?Cardiac: Irreg Irreg, no murmurs, rubs, or gallops.  ?Respiratory: crackles at bases. ?GI: Soft, nontender, non-distended  ?MS: No edema; No deformity. ?Neuro:  Nonfocal  ?Psych: Normal affect  ? ?Labs  ?  ?High Sensitivity Troponin:   ?Recent Labs  ?Lab 03/29/22 ?1403 03/29/22 ?1603  ?TROPONINIHS 20* 18*  ?   ?Chemistry ?Recent Labs  ?Lab 03/29/22 ?1403 03/30/22 ?0761904/07/23 ?0245 04/03/22 ?0509304/11/23 ?0267104/12/23 ?0459  ?NA 138 134*   < > 136 137 139  ?K 4.0 4.1   < > 3.8 3.7 3.3*  ?CL 107 105   < > 108 106 107  ?CO2 23 20*   < > 20* 23 25  ?GLUCOSE 126* 166*   < > 167* 150* 143*  ?BUN 27* 27*   < > 51* 54* 50*  ?CREATININE 0.93 0.83   < > 1.11* 1.10* 1.04*  ?CALCIUM 9.2 8.4*   < > 8.2* 8.6* 8.4*  ?MG  --  1.9   < > 2.5* 2.6* 2.5*  ?PROT 6.4*  5.8*  --   --   --   --   ?ALBUMIN 3.5 3.3*  --   --   --   --   ?AST 16 18  --   --   --   --   ?ALT 13 14  --   --   --   --   ?ALKPHOS 37* 34*  --   --   --   --   ?BILITOT 1.1 0.8  --   --   --   --   ?GFRNONAA >60 >60   < > 52* 52* 56*  ?ANIONGAP 8 9   < > _0 ? < > = values in this interval not displayed.  ?  ?Lipids No results for input(s): CHOL, TRIG, HDL, LABVLDL, LDLCALC, CHOLHDL in the last 168 hours.  ?Hematology ?Recent Labs  ?Lab 04/03/22 ?6387 04/04/22 ?0443 04/05/22 ?5643  ?WBC 1.7* 2.1* 2.4*  ?RBC 2.61* 2.78* 2.70*  ?HGB 8.5* 9.3* 8.8*  ?HCT 27.2* 28.4* 27.6*  ?MCV 104.2* 102.2* 102.2*  ?MCH 32.6 33.5 32.6  ?MCHC 31.3 32.7 31.9  ?RDW 17.0* 16.5* 16.1*  ?PLT 101* 102* 88*  ? ?Thyroid  ?Recent Labs  ?Lab 03/29/22 ?1403  ?TSH 0.731  ?FREET4 1.47*  ?  ?BNP ?Recent Labs  ?Lab 03/29/22 ?1406  ?BNP 474.3*  ?  ?DDimer  ?Recent Labs  ?Lab 03/29/22 ?1403  ?DDIMER 2.36*  ?  ? ?Radiology  ?  ?CT BIOPSY ? 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 EXAM: CT GUIDED BONE MARROW ASPIRATES AND BIOPSY Physician:  Stephan Minister. Henn, MD MEDICATIONS: Moderate sedation ANESTHESIA/SEDATION: Moderate (conscious) sedation was employed during this procedure. A total of Versed 1.58m and fentanyl 50 mcg was administered intravenously at the order of the provider performing the procedure. Total intra-service moderate sedation time: 10 minutes. Patient's level of consciousness and vital signs were monitored continuously by radiology nurse throughout the procedure under the supervision of the provider performing the procedure. COMPLICATIONS: None immediate. PROCEDURE: The procedure was explained to the patient. The risks and benefits of the procedure were discussed and the patient's questions were addressed. Informed consent was obtained from the patient. The patient was placed prone on CT table. Images of the pelvis were obtained. The right side of back was prepped and draped in sterile fashion. The skin and right posterior ilium were anesthetized with 1% lidocaine. 11 gauge bone needle was directed into the right ilium with CT guidance. Two aspirates and one core biopsy were obtained. Bandage placed over the puncture site. RADIATION DOSE REDUCTION: This exam was performed according to the departmental dose-optimization program which includes automated exposure control, adjustment of the mA and/or kV according to patient size and/or use of iterative reconstruction technique. FINDINGS: Again noted is the presacral soft tissue mass. Bone needle directed into the posterior right ilium. IMPRESSION: CT guided bone marrow aspiration and core biopsy. Electronically Signed   By: AMarkus DaftM.D.   On: 04/04/2022 12:51  ? ?CT BONE MARROW BIOPSY & ASPIRATION ? 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 EXAM: CT GUIDED BONE MARROW ASPIRATES AND BIOPSY Physician: AStephan Minister Henn, MD MEDICATIONS: Moderate sedation ANESTHESIA/SEDATION: Moderate (conscious) sedation was employed during this procedure. A total of Versed 1.052m and fentanyl 50 mcg was administered intravenously at the order of the provider performing the procedure. Total intra-service moderate sedation time: 10 minutes. Patient's level of consciousness and vital signs were monitored continuously by radiology nurse  throughout the procedure under the supervision of the provider performing the procedure. COMPLICATIONS: None immediate. PROCEDURE: The procedure was explained to the patient. The risks and benefits of the procedure were discussed and the patient's questions were addressed. Informed consent was obtained from the patient. The patient was placed prone on CT table. Images of the pelvis were obtained. The right side of back was prepped and draped in sterile fashion. The skin and right posterior ilium were anesthetized with 1% lidocaine. 11 gauge bone needle was directed into the right ilium with CT guidance. Two aspirates and one core biopsy were obtained. Bandage placed over the puncture site. RADIATION DOSE REDUCTION: This exam was performed according to the departmental dose-optimization program which includes automated exposure control, adjustment of the mA and/or kV according to patient size and/or use of iterative reconstruction technique. FINDINGS: Again noted is the presacral soft tissue mass. Bone needle directed into the posterior right ilium. IMPRESSION: CT guided bone marrow aspiration and core biopsy. Electronically Signed   By: Markus Daft M.D.   On: 04/04/2022 12:51   ? ?Cardiac Studies  ? ?Echocardiogram 03/30/22 ? 1. Left ventricular ejection fraction, by estimation, is 45 to 50%. Left  ?ventricular ejection fraction by 2D MOD biplane is 48.0 %. The left  ?ventricle has mildly decreased function. The left ventricle demonstrates  ?global hypokinesis. Left ventricular  ?diastolic function could not be evaluated.  ? 2. Right ventricular systolic function is mildly reduced. The right  ?ventricular size is normal. There is mildly elevated pulmonary artery   ?systolic pressure. The estimated right ventricular systolic pressure is  ?42.5 mmHg.  ? 3. The mitral valve is abnormal. Mild to moderate mitral valve  ?regurgitation.  ? 4. The aortic valve is tricuspid.

## 2022-04-05 NOTE — Progress Notes (Signed)
?PROGRESS NOTE ? ?[name] IRS:[id number] DOB: 03/09/1946  ? ?PCP: [name], MD ? ?Patient is from: Home ? ?DOA: 03/29/2022 LOS: 7 ? ?Chief complaints ?Chief Complaint  ?Patient presents with  ? Shortness of Breath  ?  ? ?Brief Narrative / Interim history: ? ?76 y.o. female with medical history significant of  CMML, currently on chemotherapy, pancytopenia, rheumatoid arthritis on Remicade, paroxysmal A-fib (off of Eliquis for the last 2 days prior to presentation?  Secondary to bleeding complications versus pancytopenia), hypertension, COPD (follows up with pulmonary as an outpatient), bilateral pulmonary nodules presented with worsening shortness of breath.  On presentation, she was found to be in A-fib with RVR and was started on IV Cardizem and heparin drips. CTA chest was negative for pulmonary embolism but showed bilateral pleural effusion, more on the right side with findings suggestive of multifocal pneumonia with numerous pleural-based right-sided masses, increased in size compared to previous CT. She was empirically started on IV antibiotics as per oncology recommendations.  She was put on IV steroids.  Cardiology was consulted.  She underwent ultrasound-guided right-sided thoracentesis on 03/30/2022.  Heparin drip was also held on 03/31/2022 due to drop in hemoglobin.  She was transfused 1 unit packed red cells on 03/31/2022.  Heparin drip was restarted on 04/03/2022.  ? ?Subjective: ?Seen and examined earlier this morning.  No major events overnight of this morning.  She is complaining of constipation and difficulty having bowel movements.  She states last bowel movement was about a week ago.  She denies chest pain, dyspnea, nausea or vomiting. ? ?Objective: ?Vitals:  ? 04/05/22 0727 04/05/22 0800 04/05/22 0857 04/05/22 1126  ?BP:   (!) 162/67   ?Pulse:   95   ?Resp:      ?Temp:  (!) 97.4 ?F (36.3 ?C)  98.4 ?F (36.9 ?C)  ?TempSrc:  Oral  Axillary  ?SpO2: 98%     ?Weight:      ?Height:       ? ? ?Examination: ? ?GENERAL: No apparent distress.  Nontoxic. ?HEENT: MMM.  Vision and hearing grossly intact.  ?NECK: Supple.  No apparent JVD.  ?RESP:  No IWOB.  Fair aeration bilaterally. ?CVS: Irregular rhythm at 120-130.  Heart sounds normal.  ?ABD/GI/GU: BS+. Abd soft, NTND.  ?MSK/EXT:  Moves extremities. No apparent deformity.  Trace BLE edema. ?SKIN: no apparent skin lesion or wound ?NEURO: Awake, alert and oriented appropriately.  No apparent focal neuro deficit. ?PSYCH: Calm. Normal affect.  ? ?Procedures:  ?4/6-right thoracocentesis ? ?Microbiology summarized: ?COVID-19 and influenza PCR nonreactive. ?MRSA PCR nonreactive. ?Full RVP nonreactive. ? ?Assessment and Plan: ?* Paroxysmal atrial fibrillation with RVR ([place]) ?Remains in RVR with HR ranging from 100s to 130s.  TTE with LVEF of 45 to 50%, GH, indeterminate DD and RVSP of 38.  ?-On Toprol XL 150 mg twice daily ?-Cardiology weaning of Cardizem drip. ?-On IV heparin for anticoagulation.  ? ?Chronic systolic CHF/cardiomyopathy/DOE ?TTE as above.  CTA chest negative for PE but bilateral pleural effusion, Rt>Lt. Otherwise, appears euvolemic on exam. ?-Diuretics as needed per cardiology ?-Monitor fluid and respiratory status ?-Monitor renal functions and electrolytes. ? ?Constipation ?Bowel regimen including scheduled Senokot-S twice daily, as needed MiraLAX, Dulcolax and enema ? ?Chronic kidney disease, stage 3a ([place]) ?Recent Labs  ?  03/13/22 ?1444 03/20/22 ?0093 03/29/22 ?1403 03/30/22 ?8182 03/31/22 ?0245 04/01/22 ?0500 04/02/22 ?9937 04/03/22 ?1696 04/04/22 ?7893 04/05/22 ?0459  ?BUN 33* 27* 27* 27* 37* 43* 49* 51* 54* 50*  ?CREATININE 1.12*  1.03* 0.93 0.83 1.17* 1.10* 1.09* 1.11* 1.10* 1.04*  ?Stable.  Continue monitoring ? ? ?Physical deconditioning ?PT/OT-recommended SNF. ? ?Hypokalemia ?K3.3.  Mg 2.5. ?-P.o. KCl 40x2 ?-Recheck in the morning ? ?Bilateral pleural effusion ?See dyspnea ? ?Pancytopenia ([place]) ?Likely from chemotherapy.  Leukopenia  improving.  Platelets slightly down.  Hgb relatively stable. ?-Continue monitoring ?-Oncology on board. ? ?Multifocal pneumonia ?Completed antibiotic course as above. ? ?CMML (chronic myelomonocytic leukemia) ([place]) ?Since she is on Remicade and low-dose prednisone ?-Oncology following. ? ?Hx of multiple pulmonary nodules ?Increased in size on current CT.  She had bilateral pleural effusion.  However, pleural fluid cytology with reactive mesothelial cells. ?-May need repeat imaging in 4 to 6 weeks outpatient. ? ?Stage 2 moderate COPD by GOLD classification ([place]) ?See dyspnea ? ? ? ?DVT prophylaxis:  ?Place and maintain sequential compression device Start: 04/02/22 1610 ? ?Code Status: Full code ?Family Communication: Patient and/or RN. Available if any question.  ?Level of care: Stepdown ?Status is: Inpatient ?Remains inpatient appropriate because: A-fib with RVR requiring Cardizem drip and close monitoring ? ? ?Final disposition: SNF ?Consultants:  ?Cardiology ?Oncology ? ?Sch Meds:  ?Scheduled Meds: ? arformoterol  15 mcg Nebulization BID  ? And  ? umeclidinium bromide  1 puff Inhalation Daily  ? budesonide (PULMICORT) nebulizer solution  2 mg Nebulization Q12H  ? Chlorhexidine Gluconate Cloth  6 each Topical Daily  ? metoprolol succinate  150 mg Oral BID  ? mirtazapine  7.5 mg Oral QHS  ? predniSONE  40 mg Oral Q breakfast  ? senna-docusate  1 tablet Oral BID  ? sodium chloride flush  10-40 mL Intracatheter Q12H  ? ?Continuous Infusions: ? sodium chloride Stopped (04/01/22 1557)  ? diltiazem (CARDIZEM) infusion 5 mg/hr (04/05/22 0500)  ? heparin 900 Units/hr (04/05/22 0741)  ? ?PRN Meds:.sodium chloride, acetaminophen **OR** acetaminophen, albuterol, bisacodyl, ondansetron **OR** ondansetron (ZOFRAN) IV, polyethylene glycol, sodium chloride flush, sodium phosphate, traZODone ? ?Antimicrobials: ?Anti-infectives (From admission, onward)  ? ? Start     Dose/Rate Route Frequency Ordered Stop  ? 03/30/22 1400  ceFEPIme  (MAXIPIME) 2 g in sodium chloride 0.9 % 100 mL IVPB  Status:  Discontinued       ? 2 g ?200 mL/hr over 30 Minutes Intravenous Every 12 hours 03/30/22 1025 04/03/22 1131  ? 03/30/22 0800  ceFEPIme (MAXIPIME) 2 g in sodium chloride 0.9 % 100 mL IVPB  Status:  Discontinued       ? 2 g ?200 mL/hr over 30 Minutes Intravenous Every 12 hours 03/29/22 1754 03/29/22 1757  ? 03/30/22 0200  ceFEPIme (MAXIPIME) 2 g in sodium chloride 0.9 % 100 mL IVPB  Status:  Discontinued       ? 2 g ?200 mL/hr over 30 Minutes Intravenous Every 8 hours 03/29/22 1757 03/30/22 1025  ? 03/29/22 1800  azithromycin (ZITHROMAX) 500 mg in sodium chloride 0.9 % 250 mL IVPB       ? 500 mg ?250 mL/hr over 60 Minutes Intravenous Every 24 hours 03/29/22 1747 04/02/22 1930  ? 03/29/22 1745  ceFEPIme (MAXIPIME) 2 g in sodium chloride 0.9 % 100 mL IVPB       ? 2 g ?200 mL/hr over 30 Minutes Intravenous  Once 03/29/22 1735 03/29/22 2132  ? ?  ? ? ? ?I have personally reviewed the following labs and images: ?CBC: ?Recent Labs  ?Lab 04/01/22 ?0500 04/02/22 ?6644 04/03/22 ?0347 04/04/22 ?4259 04/05/22 ?0459  ?WBC 1.1* 1.0* 1.7* 2.1* 2.4*  ?NEUTROABS  --   --   --  1.0*  --   ?HGB 8.6* 8.2* 8.5* 9.3* 8.8*  ?HCT 26.7* 26.1* 27.2* 28.4* 27.6*  ?MCV 102.3* 104.0* 104.2* 102.2* 102.2*  ?PLT 65* 81* 101* 102* 88*  ? ?BMP &GFR ?Recent Labs  ?Lab 04/01/22 ?0500 04/02/22 ?4859 04/03/22 ?2763 04/04/22 ?9432 04/05/22 ?0459  ?NA 135 134* 136 137 139  ?K 4.0 4.2 3.8 3.7 3.3*  ?CL 106 105 108 106 107  ?CO2 19* 21* 20* 23 25  ?GLUCOSE 183* 182* 167* 150* 143*  ?BUN 43* 49* 51* 54* 50*  ?CREATININE 1.10* 1.09* 1.11* 1.10* 1.04*  ?CALCIUM 8.3* 8.1* 8.2* 8.6* 8.4*  ?MG 2.3 2.4 2.5* 2.6* 2.5*  ? ?Estimated Creatinine Clearance: 40.4 mL/min (A) (by C-G formula based on SCr of 1.04 mg/dL (H)). ?Liver & Pancreas: ?Recent Labs  ?Lab 03/30/22 ?0037  ?AST 18  ?ALT 14  ?ALKPHOS 34*  ?BILITOT 0.8  ?PROT 5.8*  ?ALBUMIN 3.3*  ? ?No results for input(s): LIPASE, AMYLASE in the last 168  hours. ?No results for input(s): AMMONIA in the last 168 hours. ?Diabetic: ?No results for input(s): HGBA1C in the last 72 hours. ?Recent Labs  ?Lab 03/29/22 ?2042  ?GLUCAP 151*  ? ?Cardiac Enzymes: ?No results fo

## 2022-04-05 NOTE — Plan of Care (Signed)
?  Problem: Clinical Measurements: ?Goal: Ability to maintain a body temperature in the normal range will improve ?Outcome: Progressing ?  ?Problem: Respiratory: ?Goal: Ability to maintain adequate ventilation will improve ?Outcome: Progressing ?Goal: Ability to maintain a clear airway will improve ?Outcome: Progressing ?  ?Problem: Activity: ?Goal: Ability to tolerate increased activity will improve ?Outcome: Not Progressing ?  ?

## 2022-04-05 NOTE — Progress Notes (Signed)
ANTICOAGULATION CONSULT NOTE  ? ?Pharmacy Consult for Heparin ?Indication: atrial fibrillation ? ?Allergies  ?Allergen Reactions  ? Gluten Meal Diarrhea  ? Penicillins Rash and Other (See Comments)  ?  'Many years ago' ?  ? Alendronate Rash  ? Hydroxychloroquine Rash  ? ? ?Patient Measurements: ?Height: 5' 4"  (162.6 cm) ?Weight: 59.3 kg (130 lb 11.7 oz) ?IBW/kg (Calculated) : 54.7 ?Heparin Dosing Weight: 57.6 kg ? ?Vital Signs: ?Temp: 98.4 ?F (36.9 ?C) (04/12 1126) ?Temp Source: Axillary (04/12 1126) ?BP: 148/57 (04/12 0900) ?Pulse Rate: 62 (04/12 1400) ? ?Labs: ?Recent Labs  ?  04/03/22 ?0453 04/03/22 ?1625 04/04/22 ?0443 04/04/22 ?1136 04/05/22 ?0459 04/05/22 ?1500  ?HGB 8.5*  --  9.3*  --  8.8*  --   ?HCT 27.2*  --  28.4*  --  27.6*  --   ?PLT 101*  --  102*  --  88*  --   ?HEPARINUNFRC  --    < >  --  0.63 0.85* 0.61  ?CREATININE 1.11*  --  1.10*  --  1.04*  --   ? < > = values in this interval not displayed.  ? ? ? ?Estimated Creatinine Clearance: 40.4 mL/min (A) (by C-G formula based on SCr of 1.04 mg/dL (H)). ? ? 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  Has known hx afib on metoprolol and previously on Eliquis which was stopped on 4/3 due to pancytopenia and an episode of blood in the toilet bowl noted in 3/27 Oncology notes.  Pharmacy is consulted to dose IV heparin for afib. ? ?Last dose of Eliquis taken Monday 4/3 ? ?4/5: Baseline aPTT = 29, Heparin level = 0.27, PT/INR = 15.4/1.2 ?4/7: Heparin stopped ?4/10: OK to resume heparin per Dr Burr Medico.  No bolus per d/w Dr Starla Link ? ?04/05/2022 ?1500 Heparin level 0.61 - therapeutic on IV heparin infusing at 900 units/hr ?Hgb low/steady at 8.8, PLT low/steady 88 ?No bleeding or infusion related issues reported by RN ? ?Goal of Therapy:  ?Heparin level 0.3-0.7 units/ml ?Monitor platelets by anticoagulation protocol: Yes ?   ?Plan:  ?Continue heparin infusion at 900 units/hr ?Recheck confirmatory heparin level at 2300 ?Daily CBC and heparin level while on heparin ?F/u ability to resume PTA apixaban ? ?Royetta Asal, PharmD, BCPS ?Clinical Pharmacist ?[place] ?Please utilize Amion for appropriate phone number to reach the unit pharmacist ([place]) ?04/05/2022 3:55 PM ? ? ? ?

## 2022-04-05 NOTE — Hospital Course (Addendum)
?  76 y.o. female with medical history significant of  CMML POA, pancytopenia, RA on Remicade, paroxysmal A-fib off Eliquis due to pancytopenia, HTN, COPD, bilateral pulmonary nodules presented with worsening shortness of breath.  On presentation, she was found to be in A-fib with RVR and was started on IV Cardizem and heparin drips.  ? ?CTA chest was negative for PE but showed bilateral pleural effusion, more on the right with findings suggestive of multifocal pneumonia with numerous pleural-based right-sided masses, increased in size compared to previous CT. She was empirically started on IV antibiotics per oncology.  She was also started on IV steroid.  She underwent US-guided right-sided thoracentesis on 03/30/2022 with removal of 570 cc transudative fluid.  Fluid culture was not sent.  Cytology with reactive mesothelial cells.  Patient completed course of antibiotics and steroid.  Respiratory symptoms resolved except for DOE. ? ?Patient remains in A-fib with RVR although seems to be improving.  Cardiology following.  Heparin discontinued on 4/15 due to left dorsal foot hematoma and thrombocytopenia. ? ?Oncology following for CMML and pancytopenia.  Therapy recommended SNF. ?

## 2022-04-05 NOTE — Assessment & Plan Note (Addendum)
TTE as above.  CTA chest negative for PE but bilateral pleural effusion, Rt>Lt. Intermittently diuresed with IV Lasix.  Good urine output.  Appears euvolemic. ?-Diuretics per cardiology-getting Lasix as needed. ?-Monitor fluid and respiratory status ?-Monitor renal functions and electrolytes. ?

## 2022-04-05 NOTE — TOC Progression Note (Signed)
Transition of Care (TOC) - Progression Note  ? ? ?Patient Details  ?Name: [name] ?MRN: [id number] ?Date of Birth: 1946-09-16 ? ?Transition of Care (TOC) CM/SW Contact  ?Tawanna Cooler, RN ?Phone Number: ?04/05/2022, 12:59 PM ? ?Clinical Narrative:    ? ?Patient still on Cardizem drip, but weaning.  ?PT currently recommending SNF for rehab.  ?Will send FL2 once patient more medically stable for discharge, but submitted for PASRR and received number [id number].   ? ?  ?

## 2022-04-05 NOTE — Assessment & Plan Note (Addendum)
Likely from chemotherapy.  Leukopenia resolved.  Hemoglobin and platelet downtrending. ?-Heparin held on 4/15 due to left foot hematoma and worsening thrombocytopenia ?-Per heme-onc, transfuse for Hgb less than 7.5 and platelet less than 20 K or bleeding ?-Hgb slowly drifting down.  7.8 this morning.  Given fatigue and DOE, will transfuse 1 unit ?-Continue monitoring ?

## 2022-04-05 NOTE — Assessment & Plan Note (Addendum)
TTE with LVEF of 45 to 50%, GH, ?DD and RVSP of 38.  TSH within normal.  Remains in A-fib.  HR mostly in 70s to 110s but goes as high as 130s. ?-Cardiology managing- ?-Cardizem CD 360 mg daily and Toprol-XL 150 mg daily ?-Cardiology started digoxin. ?-Discontinued heparin on 4/15 due to left foot hematoma and worsening thrombocytopenia ?-Optimize electrolytes ?

## 2022-04-06 ENCOUNTER — Inpatient Hospital Stay: Payer: Medicare Other

## 2022-04-06 DIAGNOSIS — I5022 Chronic systolic (congestive) heart failure: Secondary | ICD-10-CM | POA: Diagnosis not present

## 2022-04-06 DIAGNOSIS — E440 Moderate protein-calorie malnutrition: Secondary | ICD-10-CM | POA: Insufficient documentation

## 2022-04-06 DIAGNOSIS — R0602 Shortness of breath: Secondary | ICD-10-CM

## 2022-04-06 DIAGNOSIS — N1831 Chronic kidney disease, stage 3a: Secondary | ICD-10-CM | POA: Diagnosis not present

## 2022-04-06 DIAGNOSIS — J90 Pleural effusion, not elsewhere classified: Secondary | ICD-10-CM | POA: Diagnosis not present

## 2022-04-06 DIAGNOSIS — I480 Paroxysmal atrial fibrillation: Secondary | ICD-10-CM | POA: Diagnosis not present

## 2022-04-06 LAB — HEPARIN LEVEL (UNFRACTIONATED)
Heparin Unfractionated: 0.38 [IU]/mL (ref 0.30–0.70)
Heparin Unfractionated: 0.42 [IU]/mL (ref 0.30–0.70)
Heparin Unfractionated: 0.8 IU/mL — ABNORMAL HIGH (ref 0.30–0.70)

## 2022-04-06 LAB — MAGNESIUM: Magnesium: 2.4 mg/dL (ref 1.7–2.4)

## 2022-04-06 LAB — CBC
HCT: 27.4 % — ABNORMAL LOW (ref 36.0–46.0)
Hemoglobin: 9.1 g/dL — ABNORMAL LOW (ref 12.0–15.0)
MCH: 33.6 pg (ref 26.0–34.0)
MCHC: 33.2 g/dL (ref 30.0–36.0)
MCV: 101.1 fL — ABNORMAL HIGH (ref 80.0–100.0)
Platelets: 76 10*3/uL — ABNORMAL LOW (ref 150–400)
RBC: 2.71 MIL/uL — ABNORMAL LOW (ref 3.87–5.11)
RDW: 15.9 % — ABNORMAL HIGH (ref 11.5–15.5)
WBC: 3.4 10*3/uL — ABNORMAL LOW (ref 4.0–10.5)
nRBC: 0 % (ref 0.0–0.2)

## 2022-04-06 LAB — SURGICAL PATHOLOGY

## 2022-04-06 LAB — RENAL FUNCTION PANEL
Albumin: 2.8 g/dL — ABNORMAL LOW (ref 3.5–5.0)
Anion gap: 6 (ref 5–15)
BUN: 46 mg/dL — ABNORMAL HIGH (ref 8–23)
CO2: 27 mmol/L (ref 22–32)
Calcium: 8.7 mg/dL — ABNORMAL LOW (ref 8.9–10.3)
Chloride: 104 mmol/L (ref 98–111)
Creatinine, Ser: 0.9 mg/dL (ref 0.44–1.00)
GFR, Estimated: >60 mL/min (ref >60)
Glucose, Bld: 111 mg/dL — ABNORMAL HIGH (ref 70–99)
Phosphorus: 2.5 mg/dL (ref 2.5–4.6)
Potassium: 4 mmol/L (ref 3.5–5.1)
Sodium: 137 mmol/L (ref 135–145)

## 2022-04-06 MED ORDER — HEPARIN (PORCINE) 25000 UT/250ML-% IV SOLN
750.0000 [IU]/h | INTRAVENOUS | Status: DC
  Administered 2022-04-06 – 2022-04-07 (×2): 750 [IU]/h via INTRAVENOUS
  Filled 2022-04-06: qty 250

## 2022-04-06 MED ORDER — ADULT MULTIVITAMIN W/MINERALS CH
1.0000 | ORAL_TABLET | Freq: Every day | ORAL | Status: DC
  Administered 2022-04-06 – 2022-04-13 (×8): 1 via ORAL
  Filled 2022-04-06 (×8): qty 1

## 2022-04-06 MED ORDER — ENSURE ENLIVE PO LIQD: 237.0000 mL | Freq: Two times a day (BID) | ORAL | Status: DC

## 2022-04-06 MED ORDER — DILTIAZEM HCL ER COATED BEADS 180 MG PO CP24
180.0000 mg | ORAL_CAPSULE | Freq: Every day | ORAL | Status: DC
  Administered 2022-04-06: 180 mg via ORAL
  Filled 2022-04-06: qty 1

## 2022-04-06 MED ORDER — DILTIAZEM HCL 60 MG PO TABS
60.0000 mg | ORAL_TABLET | Freq: Four times a day (QID) | ORAL | Status: DC
  Administered 2022-04-06 – 2022-04-07 (×4): 60 mg via ORAL
  Filled 2022-04-06 (×4): qty 1

## 2022-04-06 NOTE — Progress Notes (Signed)
IVT arrived to reaccess port.  Pt states she called out for assistance to use restroom and not the best time.  IVT will return shortly. ?

## 2022-04-06 NOTE — Progress Notes (Signed)
ANTICOAGULATION CONSULT NOTE  ? ?Pharmacy Consult for Heparin ?Indication: atrial fibrillation ? ?Allergies  ?Allergen Reactions  ? Gluten Meal Diarrhea  ? Penicillins Rash and Other (See Comments)  ?  'Many years ago' ?  ? Alendronate Rash  ? Hydroxychloroquine Rash  ? ? ?Patient Measurements: ?Height: 5' 4"  (162.6 cm) ?Weight: 57.4 kg (126 lb 8.7 oz) ?IBW/kg (Calculated) : 54.7 ?Heparin Dosing Weight: 57.6 kg ? ?Vital Signs: ?Temp: 97.9 ?F (36.6 ?C) (04/13 1126) ?Temp Source: Oral (04/13 1126) ?BP: 138/64 (04/13 0700) ?Pulse Rate: 85 (04/13 0700) ? ?Labs: ?Recent Labs  ?  04/04/22 ?0443 04/04/22 ?1136 04/05/22 ?0459 04/05/22 ?1500 04/05/22 ?2355 04/06/22 ?0500 04/06/22 ?1038  ?HGB 9.3*  --  8.8*  --   --  9.1*  --   ?HCT 28.4*  --  27.6*  --   --  27.4*  --   ?PLT 102*  --  88*  --   --  76*  --   ?HEPARINUNFRC  --    < > 0.85* 0.61 0.80*  --  0.42  ?CREATININE 1.10*  --  1.04*  --   --  0.90  --   ? < > = values in this interval not displayed.  ? ? ? ?Estimated Creatinine Clearance: 46.6 mL/min (by C-G formula based on SCr of 0.9 mg/dL). ? ? 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  Has known hx afib on metoprolol and previously on Eliquis which was stopped on 4/3 due to pancytopenia and an episode of blood in the toilet bowl noted in 3/27 Oncology notes.  Pharmacy is consulted to dose IV heparin for afib. ? ?Last dose of Eliquis taken Monday 4/3 ? ?4/5: Baseline aPTT = 29, Heparin level = 0.27, PT/INR = 15.4/1.2 ?4/7: Heparin stopped ?4/10: OK to resume heparin per Dr Burr Medico.  No bolus per d/w Dr Starla Link ? ?04/06/2022 ?Heparin level 0.42 - therapeutic on IV heparin infusing at 750 units/hr ?Hgb stable ~ 9, PLT trending down (per Onc note, plan to hold anticoagulation for plt < 50) ?No bleeding or infusion related issues noted ? ?Goal of Therapy:  ?Heparin level 0.3-0.7  units/ml ?Monitor platelets by anticoagulation protocol: Yes ?  ?Plan:  ?Continue heparin infusion at 750 units/hr ?Check 8 hr heparin level to confirm ?Daily CBC and heparin level while on heparin ?F/u ability to resume PTA apixaban ? ?Tawnya Crook, PharmD, BCPS ?Clinical Pharmacist ?04/06/2022 11:30 AM ? ? ? ? ?

## 2022-04-06 NOTE — Progress Notes (Signed)
ANTICOAGULATION CONSULT NOTE  ? ?Pharmacy Consult for Heparin ?Indication: atrial fibrillation ? ?Allergies  ?Allergen Reactions  ? Gluten Meal Diarrhea  ? Penicillins Rash and Other (See Comments)  ?  'Many years ago' ?  ? Alendronate Rash  ? Hydroxychloroquine Rash  ? ? ?Patient Measurements: ?Height: 5' 4"  (162.6 cm) ?Weight: 59.3 kg (130 lb 11.7 oz) ?IBW/kg (Calculated) : 54.7 ?Heparin Dosing Weight: 57.6 kg ? ?Vital Signs: ?Temp: 97.6 ?F (36.4 ?C) (04/13 0008) ?Temp Source: Oral (04/13 0008) ?BP: 127/75 (04/12 2200) ?Pulse Rate: 51 (04/12 2200) ? ?Labs: ?Recent Labs  ?  04/03/22 ?0453 04/03/22 ?1625 04/04/22 ?0443 04/04/22 ?1136 04/05/22 ?0459 04/05/22 ?1500 04/05/22 ?2355  ?HGB 8.5*  --  9.3*  --  8.8*  --   --   ?HCT 27.2*  --  28.4*  --  27.6*  --   --   ?PLT 101*  --  102*  --  88*  --   --   ?HEPARINUNFRC  --    < >  --    < > 0.85* 0.61 0.80*  ?CREATININE 1.11*  --  1.10*  --  1.04*  --   --   ? < > = values in this interval not displayed.  ? ? ? ?Estimated Creatinine Clearance: 40.4 mL/min (A) (by C-G formula based on SCr of 1.04 mg/dL (H)). ? ? 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  Has known hx afib on metoprolol and previously on Eliquis which was stopped on 4/3 due to pancytopenia and an episode of blood in the toilet bowl noted in 3/27 Oncology notes.  Pharmacy is consulted to dose IV heparin for afib. ? ?Last dose of Eliquis taken Monday 4/3 ? ?4/5: Baseline aPTT = 29, Heparin level = 0.27, PT/INR = 15.4/1.2 ?4/7: Heparin stopped ?4/10: OK to resume heparin per Dr Burr Medico.  No bolus per d/w Dr Starla Link ? ?04/06/2022 ?Heparin level 0.8 - now supratherapeutic on IV heparin infusing at 900 units/hr ?Hgb low/steady at 8.8, PLT low/steady 88 ?No bleeding or infusion related issues reported by RN ? ?Goal of Therapy:  ?Heparin level 0.3-0.7 units/ml ?Monitor platelets  by anticoagulation protocol: Yes ?  ?Plan:  ?Hold heparin infusion x1 hr then resume at reduced rate of 750 units/hr ?Check heparin level 8h after resuming ?Daily CBC and heparin level while on heparin ?F/u ability to resume PTA apixaban ? ?Netta Cedars, PharmD, BCPS ?Clinical Pharmacist ?[place] ?Please utilize Amion for appropriate phone number to reach the unit pharmacist ([place]) ?04/06/2022 12:34 AM ? ? ? ?

## 2022-04-06 NOTE — Progress Notes (Signed)
ANTICOAGULATION CONSULT NOTE  ? ?Pharmacy Consult for Heparin ?Indication: atrial fibrillation ? ?Allergies  ?Allergen Reactions  ? Gluten Meal Diarrhea  ? Penicillins Rash and Other (See Comments)  ?  'Many years ago' ?  ? Alendronate Rash  ? Hydroxychloroquine Rash  ? ? ?Patient Measurements: ?Height: 5' 4"  (162.6 cm) ?Weight: 57.4 kg (126 lb 8.7 oz) ?IBW/kg (Calculated) : 54.7 ?Heparin Dosing Weight: 57.6 kg ? ?Vital Signs: ?Temp: 98.9 ?F (37.2 ?C) (04/13 1526) ?Temp Source: Oral (04/13 1526) ?BP: 141/75 (04/13 1526) ?Pulse Rate: 86 (04/13 1526) ? ?Labs: ?Recent Labs  ?  04/04/22 ?0443 04/04/22 ?1136 04/05/22 ?0459 04/05/22 ?1500 04/05/22 ?2355 04/06/22 ?0500 04/06/22 ?1038 04/06/22 ?1955  ?HGB 9.3*  --  8.8*  --   --  9.1*  --   --   ?HCT 28.4*  --  27.6*  --   --  27.4*  --   --   ?PLT 102*  --  88*  --   --  76*  --   --   ?HEPARINUNFRC  --    < > 0.85*   < > 0.80*  --  0.42 0.38  ?CREATININE 1.10*  --  1.04*  --   --  0.90  --   --   ? < > = values in this interval not displayed.  ? ? ? ?Estimated Creatinine Clearance: 46.6 mL/min (by C-G formula based on SCr of 0.9 mg/dL). ? ? 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  Has known hx afib on metoprolol and previously on Eliquis which was stopped on 4/3 due to pancytopenia and an episode of blood in the toilet bowl noted in 3/27 Oncology notes.  Pharmacy is consulted to dose IV heparin for afib. ? ?Last dose of Eliquis taken Monday 4/3 ? ?4/5: Baseline aPTT = 29, Heparin level = 0.27, PT/INR = 15.4/1.2 ?4/7: Heparin stopped ?4/10: OK to resume heparin per Dr Burr Medico.  No bolus per d/w Dr Starla Link ? ?04/06/2022 PM UPDATE ?Heparin level 0.38 - therapeutic on IV heparin infusing at 750 units/hr ?Hgb stable ~ 9, PLT trending down (per Onc note, plan to hold anticoagulation for plt < 50) ?No bleeding or infusion related issues  noted ? ?Goal of Therapy:  ?Heparin level 0.3-0.7 units/ml ?Monitor platelets by anticoagulation protocol: Yes ?  ?Plan:  ?Continue heparin infusion at 750 units/hr ?Daily CBC and heparin level while on heparin ?F/u ability to resume PTA apixaban ? ?Dimple Nanas, PharmD ?04/06/2022 8:18 PM ? ? ?

## 2022-04-06 NOTE — Progress Notes (Signed)
OT Cancellation Note ? ?Patient Details ?Name: [name] ?MRN: [id number] ?DOB: 10-Aug-1946 ? ? ?Cancelled Treatment:    Reason Eval/Treat Not Completed: Medical issues which prohibited therapy. Patient with elevated HR. Will follow.  ? ?Laasya Peyton L Khaidyn Staebell ?04/06/2022, 2:21 PM ?

## 2022-04-06 NOTE — Progress Notes (Signed)
Port deaccessed, EMLA cream applied per pt request for re-access. Will return to reaccess port in 44mn/1hr. Bedside RN aware ?

## 2022-04-06 NOTE — Assessment & Plan Note (Signed)
Body mass index is 21.72 kg/m?Marland Kitchen   ?Nutrition Problem: Moderate Malnutrition ?Etiology: chronic illness, cancer and cancer related treatments ?Signs/Symptoms: mild fat depletion, moderate muscle depletion, severe muscle depletion ?Interventions: Ensure Enlive (each supplement provides 350kcal and 20 grams of protein) ?

## 2022-04-06 NOTE — Progress Notes (Addendum)
? ?Progress Note ? ?Patient Name: [name] ?Date of Encounter: 04/06/2022 ? ?[facility] Cardiologist: Mertie Moores, MD  ? ?Subjective  ? ?She is unaware of her rhythm. She describes dizziness in bed for the last three days, but also describes blurry vision.  ? ?Inpatient Medications  ?  ?Scheduled Meds: ? arformoterol  15 mcg Nebulization BID  ? And  ? umeclidinium bromide  1 puff Inhalation Daily  ? budesonide (PULMICORT) nebulizer solution  0.5 mg Nebulization Q12H  ? Chlorhexidine Gluconate Cloth  6 each Topical Daily  ? diltiazem  60 mg Oral Q6H  ? mouth rinse  15 mL Mouth Rinse BID  ? metoprolol succinate  150 mg Oral BID  ? mirtazapine  7.5 mg Oral QHS  ? predniSONE  40 mg Oral Q breakfast  ? senna-docusate  1 tablet Oral BID  ? sodium chloride flush  10-40 mL Intracatheter Q12H  ? ?Continuous Infusions: ? sodium chloride Stopped (04/01/22 1557)  ? diltiazem (CARDIZEM) infusion Stopped (04/06/22 0224)  ? heparin 750 Units/hr (04/06/22 0400)  ? ?PRN Meds: ?sodium chloride, acetaminophen **OR** acetaminophen, albuterol, bisacodyl, ondansetron **OR** ondansetron (ZOFRAN) IV, polyethylene glycol, sodium chloride flush, sodium phosphate, traZODone  ? ?Vital Signs  ?  ?Vitals:  ? 04/06/22 0600 04/06/22 0700 04/06/22 0727 04/06/22 0753  ?BP: (!) 128/49 138/64    ?Pulse: 81 85    ?Resp: 13 14    ?Temp:    (!) 97.4 ?F (36.3 ?C)  ?TempSrc:    Oral  ?SpO2: 97% 95% 95%   ?Weight:      ?Height:      ? ? ?Intake/Output Summary (Last 24 hours) at 04/06/2022 0950 ?Last data filed at 04/06/2022 0400 ?Gross per 24 hour  ?Intake 231.13 ml  ?Output 1850 ml  ?Net -1618.87 ml  ? ? ?  04/06/2022  ?  5:00 AM 04/05/2022  ?  5:00 AM 04/04/2022  ?  4:48 AM  ?Last 3 Weights  ?Weight (lbs) 126 lb 8.7 oz 130 lb 11.7 oz 132 lb 15 oz  ?Weight (kg) 57.4 kg 59.3 kg 60.3 kg  ?   ? ?Telemetry  ?  ?Afib with rates in the 80-100s - Personally Reviewed ? ?ECG  ?  ?No new tracings - Personally Reviewed ? ?Physical Exam  ? ?GEN: No acute  distress.   ?Neck: No JVD ?Cardiac: iRRR ?Respiratory: Clear to auscultation bilaterally. ?GI: Soft, nontender, non-distended  ?MS: B LE tender to soft palpiation ?Neuro:  Nonfocal  ?Psych: Normal affect  ? ?Labs  ?  ?High Sensitivity Troponin:   ?Recent Labs  ?Lab 03/29/22 ?1403 03/29/22 ?1603  ?TROPONINIHS 20* 18*  ?   ?Chemistry ?Recent Labs  ?Lab 04/04/22 ?0443 04/05/22 ?0459 04/06/22 ?0500  ?NA 137 139 137  ?K 3.7 3.3* 4.0  ?CL 106 107 104  ?CO2 23 25 27   ?GLUCOSE 150* 143* 111*  ?BUN 54* 50* 46*  ?CREATININE 1.10* 1.04* 0.90  ?CALCIUM 8.6* 8.4* 8.7*  ?MG 2.6* 2.5* 2.4  ?ALBUMIN  --   --  2.8*  ?GFRNONAA 52* 56* >60  ?ANIONGAP 8 7 6   ?  ?Lipids No results for input(s): CHOL, TRIG, HDL, LABVLDL, LDLCALC, CHOLHDL in the last 168 hours.  ?Hematology ?Recent Labs  ?Lab 04/04/22 ?0443 04/05/22 ?0459 04/06/22 ?0500  ?WBC 2.1* 2.4* 3.4*  ?RBC 2.78* 2.70* 2.71*  ?HGB 9.3* 8.8* 9.1*  ?HCT 28.4* 27.6* 27.4*  ?MCV 102.2* 102.2* 101.1*  ?MCH 33.5 32.6 33.6  ?MCHC 32.7 31.9 33.2  ?  RDW 16.5* 16.1* 15.9*  ?PLT 102* 88* 76*  ? ?Thyroid No results for input(s): TSH, FREET4 in the last 168 hours.  ?BNPNo results for input(s): BNP, PROBNP in the last 168 hours.  ?DDimer No results for input(s): DDIMER in the last 168 hours.  ? ?Radiology  ?  ?CT BIOPSY ? 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 EXAM: CT GUIDED BONE MARROW ASPIRATES AND BIOPSY Physician: Stephan Minister. Henn, MD MEDICATIONS: Moderate sedation ANESTHESIA/SEDATION: Moderate (conscious) sedation was employed during this procedure. A total of Versed 1.81m and fentanyl 50 mcg was administered intravenously at the order of the provider performing the procedure. Total intra-service moderate sedation time: 10 minutes. Patient's level of consciousness and vital signs were monitored continuously by radiology nurse throughout the procedure under the supervision of the provider performing the procedure. COMPLICATIONS: None immediate. PROCEDURE: The  procedure was explained to the patient. The risks and benefits of the procedure were discussed and the patient's questions were addressed. Informed consent was obtained from the patient. The patient was placed prone on CT table. Images of the pelvis were obtained. The right side of back was prepped and draped in sterile fashion. The skin and right posterior ilium were anesthetized with 1% lidocaine. 11 gauge bone needle was directed into the right ilium with CT guidance. Two aspirates and one core biopsy were obtained. Bandage placed over the puncture site. RADIATION DOSE REDUCTION: This exam was performed according to the departmental dose-optimization program which includes automated exposure control, adjustment of the mA and/or kV according to patient size and/or use of iterative reconstruction technique. FINDINGS: Again noted is the presacral soft tissue mass. Bone needle directed into the posterior right ilium. IMPRESSION: CT guided bone marrow aspiration and core biopsy. Electronically Signed   By: AMarkus DaftM.D.   On: 04/04/2022 12:51  ? ?DG Abd Portable 1V ? ?Result Date: 04/05/2022 ?CLINICAL DATA:  Abdominal pain EXAM: PORTABLE ABDOMEN - 1 VIEW COMPARISON:  None. FINDINGS: Possible small right pleural effusion. No bowel dilatation to suggest obstruction. No evidence of pneumoperitoneum, portal venous gas or pneumatosis. No pathologic calcifications along the expected course of the ureters. No acute osseous abnormality. IMPRESSION: 1. No bowel obstruction. 2. Possible small right pleural effusion. Electronically Signed   By: HKathreen DevoidM.D.   On: 04/05/2022 13:38  ? ?CT BONE MARROW BIOPSY & ASPIRATION ? 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 EXAM: CT GUIDED BONE MARROW ASPIRATES AND BIOPSY Physician: AStephan Minister Henn, MD MEDICATIONS: Moderate sedation ANESTHESIA/SEDATION: Moderate (conscious) sedation was employed during this procedure. A total of Versed 1.032mand fentanyl  50 mcg was administered intravenously at the order of the provider performing the procedure. Total intra-service moderate sedation time: 10 minutes. Patient's level of consciousness and vital signs were monitored continuously by radiology nurse throughout the procedure under the supervision of the provider performing the procedure. COMPLICATIONS: None immediate. PROCEDURE: The procedure was explained to the patient. The risks and benefits of the procedure were discussed and the patient's questions were addressed. Informed consent was obtained from the patient. The patient was placed prone on CT table. Images of the pelvis were obtained. The right side of back was prepped and draped in sterile fashion. The skin and right posterior ilium were anesthetized with 1% lidocaine. 11 gauge bone needle was directed into the right ilium with CT guidance. Two aspirates and one core biopsy were obtained. Bandage placed over the puncture site. RADIATION DOSE REDUCTION: This exam was performed  according to the departmental dose-optimization program which includes automated exposure control, adjustment of the mA and/or kV according to patient size and/or use of iterative reconstruction technique. FINDINGS: Again noted is the presacral soft tissue mass. Bone needle directed into the posterior right ilium. IMPRESSION: CT guided bone marrow aspiration and core biopsy. Electronically Signed   By: Markus Daft M.D.   On: 04/04/2022 12:51  ? ?LONG TERM MONITOR (3-14 DAYS) ? ?Result Date: 04/05/2022 ?? Atrial fib ? Single episode of nonsustained Ventricular tachycardia ( 17 beats , avg HR was 202)  Patch Wear Time:  3 days and 1 hours (2023-03-31T11:57:01-399 to 2023-04-03T13:45:17-0400) 1 run of Ventricular Tachycardia occurred lasting 17 beats with a max rate of 240 bpm (avg 202 bpm). Atrial Fibrillation occurred continuously (100% burden), ranging from 120-252 bpm (avg of 168 bpm). Isolated VEs were rare (<1.0%), and no VE Couplets or   VE Triplets were present. MD notification criteria for Rapid Atrial Fibrillation met - notified Sherri P on 31 March 2022 at 9:08 AM CDT (BD).   ? ?Cardiac Studies  ? ?Echocardiogram 03/30/22 ? 1. Left ven

## 2022-04-06 NOTE — Progress Notes (Signed)
?PROGRESS NOTE ? ?[name] QMG:[id number] DOB: 1946-10-19  ? ?PCP: [name], MD ? ?Patient is from: Home ? ?DOA: 03/29/2022 LOS: 8 ? ?Chief complaints ?Chief Complaint  ?Patient presents with  ? Shortness of Breath  ?  ? ?Brief Narrative / Interim history: ? ?76 y.o. female with medical history significant of  CMML, currently on chemotherapy, pancytopenia, rheumatoid arthritis on Remicade, paroxysmal A-fib (off of Eliquis for the last 2 days prior to presentation?  Secondary to bleeding complications versus pancytopenia), hypertension, COPD (follows up with pulmonary as an outpatient), bilateral pulmonary nodules presented with worsening shortness of breath.  On presentation, she was found to be in A-fib with RVR and was started on IV Cardizem and heparin drips. CTA chest was negative for pulmonary embolism but showed bilateral pleural effusion, more on the right side with findings suggestive of multifocal pneumonia with numerous pleural-based right-sided masses, increased in size compared to previous CT. She was empirically started on IV antibiotics as per oncology recommendations.  She was put on IV steroids.  Cardiology was consulted.  She underwent ultrasound-guided right-sided thoracentesis on 03/30/2022.  Heparin drip was also held on 03/31/2022 due to drop in hemoglobin.  She was transfused 1 unit packed red cells on 03/31/2022.  Heparin drip was restarted on 04/03/2022.  ? ?Subjective: ?Seen and examined earlier this morning.  No major events overnight of this morning.  No complaints.  She denies chest pain, dyspnea, palpitation, GI or UTI symptoms.  Constipation resolved. ? ?Objective: ?Vitals:  ? 04/06/22 0727 04/06/22 0753 04/06/22 1126 04/06/22 1300  ?BP:    (!) 150/80  ?Pulse:      ?Resp:    19  ?Temp:  (!) 97.4 ?F (36.3 ?C) 97.9 ?F (36.6 ?C)   ?TempSrc:  Oral Oral   ?SpO2: 95%   95%  ?Weight:      ?Height:      ? ? ?Examination: ? ?GENERAL: No apparent distress.  Nontoxic. ?HEENT: MMM.  Vision  and hearing grossly intact.  ?NECK: Supple.  No apparent JVD.  ?RESP:  No IWOB.  Fair aeration bilaterally. ?CVS: Irregular rhythm.  HR ranges from 70s to 100.  RRR. Heart sounds normal.  ?ABD/GI/GU: BS+. Abd soft, NTND.  ?MSK/EXT:  Moves extremities. No apparent deformity. No edema.  ?SKIN: no apparent skin lesion or wound ?NEURO: Awake and alert. Oriented appropriately.  No apparent focal neuro deficit. ?PSYCH: Calm. Normal affect.  ? ?Procedures:  ?4/6-right thoracocentesis ? ?Microbiology summarized: ?COVID-19 and influenza PCR nonreactive. ?MRSA PCR nonreactive. ?Full RVP nonreactive. ? ?Assessment and Plan: ?* Paroxysmal atrial fibrillation with RVR ([place]) ?RVR improved.  HR ranges from 70s to 100.  Still irregular.  TTE with LVEF of 45 to 50%, GH, indeterminate DD and RVSP of 38.  ?-Cardiology managing ?-Off Cardizem drip.  Consolidated Cardizem to Cardizem CD 180 mg daily. ?-On Toprol XL 150 mg twice daily ?-On IV heparin for anticoagulation.  Defer to long-term anticoagulation to cardiology ? ?Dyspnea ?Multifactorial including COPD exacerbation, bilateral pleural effusion, A-fib with RVR, cardiomyopathy and possible multifocal pneumonia.   BNP not significantly elevated.  Seems to be improving.  She denies respiratory symptoms this morning.  Lung exam reassuring. Currently on room air. ?-Treat treatable causes.  ?-Completed antibiotic course for possible pneumonia ?-S/p right thoracentesis with removal of 570 cc fluid on 4/6.  Cytology shows reactive mesothelial cells. ?-Stop steroid.  Reportedly did not tolerate Breztri, and has been on [employer] ?-Continue nebulizers, incentive spirometry, OOB, PT/OT ? ?Chronic systolic  CHF/cardiomyopathy/DOE ?TTE as above.  CTA chest negative for PE but bilateral pleural effusion, Rt>Lt. Otherwise, appears euvolemic on exam.  Intermittently diuresed with IV Lasix.  About 1.9 L UOP overnight. ?-Diuretics as needed per cardiology ?-Monitor fluid and respiratory  status ?-Monitor renal functions and electrolytes. ? ?Malnutrition of moderate degree ?Body mass index is 21.72 kg/m?Marland Kitchen   ?Nutrition Problem: Moderate Malnutrition ?Etiology: chronic illness, cancer and cancer related treatments ?Signs/Symptoms: mild fat depletion, moderate muscle depletion, severe muscle depletion ?Interventions: Ensure Enlive (each supplement provides 350kcal and 20 grams of protein) ? ?Constipation ?Resolved.   ?-Continue bowel regimen ? ?Chronic kidney disease, stage 3a ([place]) ?Recent Labs  ?  03/20/22 ?6222 03/29/22 ?1403 03/30/22 ?9798 03/31/22 ?0245 04/01/22 ?0500 04/02/22 ?0610 04/03/22 ?0453 04/04/22 ?0443 04/05/22 ?0459 04/06/22 ?0500  ?BUN 27* 27* 27* 37* 43* 49* 51* 54* 50* 46*  ?CREATININE 1.03* 0.93 0.83 1.17* 1.10* 1.09* 1.11* 1.10* 1.04* 0.90  ?Stable.  Continue monitoring ? ? ?Physical deconditioning ?PT/OT-recommended SNF. ? ?Hypokalemia ?Resolved. ? ?Bilateral pleural effusion ?See dyspnea ? ?Pancytopenia ([place]) ?Likely from chemotherapy.  Leukopenia improving.  Platelets slightly down.  Hgb relatively stable. ?-Continue monitoring ?-Per oncology. ? ?Multifocal pneumonia ?Completed antibiotic course as above. ? 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 ? ?Hx of multiple pulmonary nodules ?Increased in size on current CT.  She had bilateral pleural effusion.  However, pleural fluid cytology with reactive mesothelial cells. ?-May need repeat imaging in 4 to 6 weeks outpatient. ? ?Stage 2 moderate COPD by GOLD classification ([place]) ?See dyspnea ? ? ? ?DVT prophylaxis:  ?Place and maintain sequential compression device Start: 04/02/22 1610 ? ?Code Status: Full code ?Family Communication: Patient and/or RN. Available if any question.  ?Level of care: Telemetry ?Status is: Inpatient ?Remains inpatient appropriate because: A-fib with RVR and safe disposition ? ? ?Final disposition: SNF ?Consultants:   ?Cardiology ?Oncology ? ?Sch Meds:  ?Scheduled Meds: ? arformoterol  15 mcg Nebulization BID  ? And  ? umeclidinium bromide  1 puff Inhalation Daily  ? budesonide (PULMICORT) nebulizer solution  0.5 mg Nebulization Q12H  ? Chlorhexidine Gluconate Cloth  6 each Topical Daily  ? diltiazem  180 mg Oral Daily  ? feeding supplement  237 mL Oral BID BM  ? mouth rinse  15 mL Mouth Rinse BID  ? metoprolol succinate  150 mg Oral BID  ? mirtazapine  7.5 mg Oral QHS  ? multivitamin with minerals  1 tablet Oral Daily  ? senna-docusate  1 tablet Oral BID  ? sodium chloride flush  10-40 mL Intracatheter Q12H  ? ?Continuous Infusions: ? sodium chloride Stopped (04/01/22 1557)  ? heparin 750 Units/hr (04/06/22 0400)  ? ?PRN Meds:.sodium chloride, acetaminophen **OR** acetaminophen, albuterol, bisacodyl, ondansetron **OR** ondansetron (ZOFRAN) IV, polyethylene glycol, sodium chloride flush, sodium phosphate, traZODone ? ?Antimicrobials: ?Anti-infectives (From admission, onward)  ? ? Start     Dose/Rate Route Frequency Ordered Stop  ? 03/30/22 1400  ceFEPIme (MAXIPIME) 2 g in sodium chloride 0.9 % 100 mL IVPB  Status:  Discontinued       ? 2 g ?200 mL/hr over 30 Minutes Intravenous Every 12 hours 03/30/22 1025 04/03/22 1131  ? 03/30/22 0800  ceFEPIme (MAXIPIME) 2 g in sodium chloride 0.9 % 100 mL IVPB  Status:  Discontinued       ? 2 g ?200 mL/hr over 30 Minutes Intravenous Every 12 hours 03/29/22 1754 03/29/22 1757  ? 03/30/22 0200  ceFEPIme (MAXIPIME)  2 g in sodium chloride 0.9 % 100 mL IVPB  Status:  Discontinued       ? 2 g ?200 mL/hr over 30 Minutes Intravenous Every 8 hours 03/29/22 1757 03/30/22 1025  ? 03/29/22 1800  azithromycin (ZITHROMAX) 500 mg in sodium chloride 0.9 % 250 mL IVPB       ? 500 mg ?250 mL/hr over 60 Minutes Intravenous Every 24 hours 03/29/22 1747 04/02/22 1930  ? 03/29/22 1745  ceFEPIme (MAXIPIME) 2 g in sodium chloride 0.9 % 100 mL IVPB       ? 2 g ?200 mL/hr over 30 Minutes Intravenous  Once 03/29/22  1735 03/29/22 2132  ? ?  ? ? ? ?I have personally reviewed the following labs and images: ?CBC: ?Recent Labs  ?Lab 04/02/22 ?0610 04/03/22 ?0453 04/04/22 ?0443 04/05/22 ?0459 04/06/22 ?0500  ?WBC 1.0* 1.7* 2.1* 2.4* 3.4*  ?NEUTROABS  --   --

## 2022-04-06 NOTE — Progress Notes (Signed)
Mrs. Jobst noted to be in AF with RVR rate of 160bpm, sustaining HR in 140s. Patient denies shortness of breath, palpitations, and chest pain.  ?BP 140/60.  ?Dr. Margaretann Loveless with Cardiology notified of findings. Afib will be managed with oral meds per Margaretann Loveless.  ?Dr. Cyndia Skeeters notified.  ?Patient will be transferred to progressive care 4W 1426 report called with Verneita Griffes RN ?

## 2022-04-06 NOTE — Progress Notes (Signed)
PT Cancellation Note ? ?Patient Details ?Name: [name] ?MRN: [id number] ?DOB: September 17, 1946 ? ? ?Cancelled Treatment:    Reason Eval/Treat Not Completed: Medical issues which prohibited therapy (HR in 140s. Will follow.) ? ? ?Blondell Reveal Kistler PT 04/06/2022  ?Acute Rehabilitation Services ?Pager [phone] ?Office [phone] ? ?

## 2022-04-06 NOTE — Progress Notes (Signed)
Initial Nutrition Assessment ? ?DOCUMENTATION CODES:  ? ?Non-severe (moderate) malnutrition in context of chronic illness ? ?INTERVENTION:  ?- will order Ensure Plus High Protein BID, each supplement provides 350 kcal and 20 grams of protein. ?- will order 1 tablet multivitamin with minerals daily. ? ? ?NUTRITION DIAGNOSIS:  ? ?Moderate Malnutrition related to chronic illness, cancer and cancer related treatments as evidenced by mild fat depletion, moderate muscle depletion, severe muscle depletion. ? ?GOAL:  ? ?Patient will meet greater than or equal to 90% of their needs ? ?MONITOR:  ? ?PO intake, Supplement acceptance, Labs, Weight trends ? ?REASON FOR ASSESSMENT:  ? ?Consult ?Assessment of nutrition requirement/status, Poor PO ? ?ASSESSMENT:  ? ?76 y.o. female with medical history of CMML currently on chemo, pancytopenia, rheumatoid arthritis on Remicade, A-fib, HTN, COPD, and bilateral pulmonary nodules. She presented to the ED due to worsening shortness of breath and was found to be in afib with RVR and started on IV Cardizem and heparin drips. CTA chest was negative for pulmonary embolism but showed bilateral pleural effusion (R>L), findings suggestive of multifocal PNA with numerous pleural-based R-sided masses, increased in size compared to previous CT. ? ?Patient sitting up in bed with no visitors present at the time of RD visit. Patient was seen by a RD at the [facility] on 03/06/22. Patient shares that since that time she has overall had a fair to good appetite but that appetite has been "zero"/poor since admission. ? ?For breakfast this AM she ate 50% of a muffin, 50% of an orange, and had some Boost that her husband brought in from home. He has been bringing these supplements in for her throughout the past week.  ? ?Patient reports that at home she has a walker to aid in balance and ambulation. She shares that at home she walks around frequently and for the past week has been mainly in bed while at  the hospital, only getting out of bed 3 times since she has been here.  ? ?Patient reports that she has also had constipation since admission and that yesterday was her first BM since PTA. She was experiencing cramping, pressure-like abdominal pain which is now mainly resolved.  ? ?She does report some taste alteration (lack of taste) which has impacted intakes slightly.  ? ?Weight today is 126 lb, weight on 4/5 was 123 lb, and weight has been stable overall since 02/06/22. ? ? ?Labs reviewed; BUN: 46 mg/dl, Ca: 8.7 mg/dl. ?Medications reviewed; 40 mg deltasone/day, 1 tablet senokot BID. ?  ? ?NUTRITION - FOCUSED PHYSICAL EXAM: ? ?Flowsheet Row Most Recent Value  ?Orbital Region No depletion  ?Upper Arm Region Mild depletion  ?Thoracic and Lumbar Region Unable to assess  ?Buccal Region Mild depletion  ?Temple Region No depletion  ?Clavicle Bone Region Mild depletion  ?Clavicle and Acromion Bone Region No depletion  ?Scapular Bone Region No depletion  ?Dorsal Hand Severe depletion  ?Patellar Region Severe depletion  ?Anterior Thigh Region Moderate depletion  ?Posterior Calf Region Moderate depletion  ?Edema (RD Assessment) None  ?Hair Reviewed  ?Eyes Reviewed  ?Mouth Reviewed  ?Skin Reviewed  ?Nails Reviewed  ? ?  ? ? ?Diet Order:   ?Diet Order   ? ?       ?  Diet Heart Room service appropriate? Yes; Fluid consistency: Thin; Fluid restriction: 1500 mL Fluid  Diet effective now       ?  ? ?  ?  ? ?  ? ? ?EDUCATION NEEDS:  ? ?  Education needs have been addressed ? ?Skin:  Skin Assessment: Reviewed RN Assessment ? ?Last BM:  4/12 (type 2 x1, medium amount; type 6 x2, both large amounts) ? ?Height:  ? ?Ht Readings from Last 1 Encounters:  ?03/29/22 5' 4"  (1.626 m)  ? ? ?Weight:  ? ?Wt Readings from Last 1 Encounters:  ?04/06/22 57.4 kg  ? ? ? ?BMI:  Body mass index is 21.72 kg/m?. ? ?Estimated Nutritional Needs:  ?Kcal:  1800-2000 kcal ?Protein:  90-105 grams ?Fluid:  >/= 2 L/day ? ? ? ? ?Jarome Matin, MS, RD,  LDN ?Registered Dietitian II ?Inpatient Clinical Nutrition ?RD pager # and on-call/weekend pager # available in [place]  ? ?

## 2022-04-06 NOTE — Assessment & Plan Note (Addendum)
Multifactorial including COPD, pleural effusion, A-fib with RVR, cardiomyopathy, anemia, pneumonia and physical deconditioning. Denies cardiopulmonary symptoms at rest.  Improving. ?-Treat treatable causes.  ?-Completed antibiotic course for possible pneumonia and steroid course for COPD ?-S/p right thoracentesis with removal of 570 cc transudative fluid on 4/6.  Cytology-reactive mesothelial cells. ?-Reportedly did not tolerate Breztri, and has been on [employer] ?-Continue nebulizers, incentive spirometry, OOB, PT/OT ?

## 2022-04-07 ENCOUNTER — Inpatient Hospital Stay: Payer: Medicare Other

## 2022-04-07 DIAGNOSIS — I480 Paroxysmal atrial fibrillation: Secondary | ICD-10-CM | POA: Diagnosis not present

## 2022-04-07 DIAGNOSIS — J90 Pleural effusion, not elsewhere classified: Secondary | ICD-10-CM | POA: Diagnosis not present

## 2022-04-07 DIAGNOSIS — I5022 Chronic systolic (congestive) heart failure: Secondary | ICD-10-CM | POA: Diagnosis not present

## 2022-04-07 DIAGNOSIS — N1831 Chronic kidney disease, stage 3a: Secondary | ICD-10-CM | POA: Diagnosis not present

## 2022-04-07 LAB — RENAL FUNCTION PANEL
Albumin: 2.9 g/dL — ABNORMAL LOW (ref 3.5–5.0)
Anion gap: 6 (ref 5–15)
BUN: 40 mg/dL — ABNORMAL HIGH (ref 8–23)
CO2: 28 mmol/L (ref 22–32)
Calcium: 9.1 mg/dL (ref 8.9–10.3)
Chloride: 105 mmol/L (ref 98–111)
Creatinine, Ser: 0.95 mg/dL (ref 0.44–1.00)
GFR, Estimated: >60 mL/min (ref >60)
Glucose, Bld: 101 mg/dL — ABNORMAL HIGH (ref 70–99)
Phosphorus: 3 mg/dL (ref 2.5–4.6)
Potassium: 3.7 mmol/L (ref 3.5–5.1)
Sodium: 139 mmol/L (ref 135–145)

## 2022-04-07 LAB — CBC
HCT: 26.1 % — ABNORMAL LOW (ref 36.0–46.0)
Hemoglobin: 8.8 g/dL — ABNORMAL LOW (ref 12.0–15.0)
MCH: 34 pg (ref 26.0–34.0)
MCHC: 33.7 g/dL (ref 30.0–36.0)
MCV: 100.8 fL — ABNORMAL HIGH (ref 80.0–100.0)
Platelets: 70 10*3/uL — ABNORMAL LOW (ref 150–400)
RBC: 2.59 MIL/uL — ABNORMAL LOW (ref 3.87–5.11)
RDW: 15.8 % — ABNORMAL HIGH (ref 11.5–15.5)
WBC: 4.1 10*3/uL (ref 4.0–10.5)
nRBC: 0 % (ref 0.0–0.2)

## 2022-04-07 LAB — MAGNESIUM: Magnesium: 2.4 mg/dL (ref 1.7–2.4)

## 2022-04-07 LAB — HEPARIN LEVEL (UNFRACTIONATED): Heparin Unfractionated: 0.41 IU/mL (ref 0.30–0.70)

## 2022-04-07 MED ORDER — POTASSIUM CHLORIDE CRYS ER 20 MEQ PO TBCR
40.0000 meq | EXTENDED_RELEASE_TABLET | Freq: Once | ORAL | Status: AC
  Administered 2022-04-07: 40 meq via ORAL
  Filled 2022-04-07: qty 2

## 2022-04-07 MED ORDER — FUROSEMIDE 40 MG PO TABS
40.0000 mg | ORAL_TABLET | Freq: Once | ORAL | Status: AC
  Administered 2022-04-07: 40 mg via ORAL
  Filled 2022-04-07: qty 1

## 2022-04-07 MED ORDER — DILTIAZEM HCL 60 MG PO TABS
90.0000 mg | ORAL_TABLET | Freq: Four times a day (QID) | ORAL | Status: DC
  Administered 2022-04-07 – 2022-04-10 (×10): 90 mg via ORAL
  Filled 2022-04-07 (×10): qty 1

## 2022-04-07 MED ORDER — DICYCLOMINE HCL 10 MG PO CAPS
10.0000 mg | ORAL_CAPSULE | Freq: Three times a day (TID) | ORAL | Status: DC | PRN
  Administered 2022-04-07 – 2022-04-09 (×4): 10 mg via ORAL
  Filled 2022-04-07 (×4): qty 1

## 2022-04-07 NOTE — Progress Notes (Signed)
[name]   DOB:[birth year]   NG#:[id number]   GMW#:[id number] ? ?Heme-onc follow-up note ? ?Subjective: [name] has been moved out of ICU, still complains profound fatigue and weakness, not able to walk. She also reports constipation and low abdominal cramps. Her nurse reports that she had good bowel movement this morning, but the patient does not remember.  She wants something for her abdominal cramps/pain.  ? ? ?Objective:  ?Vitals:  ? 04/07/22 1137 04/07/22 1230  ?BP:  137/85  ?Pulse:  85  ?Resp:  16  ?Temp:  98.1 ?F (36.7 ?C)  ?SpO2: 92% 92%  ?  Body mass index is 21.15 kg/m?. ? ?Intake/Output Summary (Last 24 hours) at 04/07/2022 1921 ?Last data filed at 04/07/2022 1449 ?Gross per 24 hour  ?Intake 360 ml  ?Output 2750 ml  ?Net -2390 ml  ? ? ? Sclerae unicteric ? Oropharynx clear ? No peripheral adenopathy ? Lungs slightly decreased breath sound on lung bases ? Heart regular rate and rhythm ? Abdomen benign ? MSK no focal spinal tenderness, no peripheral edema ? Neuro nonfocal ?  ? ?CBG (last 3)  ?No results for input(s): GLUCAP in the last 72 hours. ? ? ? ?Labs:  ? ?Urine Studies ?No results for input(s): UHGB, CRYS in the last 72 hours. ? ?Invalid input(s): UACOL, UAPR, USPG, UPH, UTP, UGL, UKET, UBIL, UNIT, UROB, ULEU, UEPI, UWBC, URBC, UBAC, CAST, UCOM, BILUA ? ?Basic Metabolic Panel: ?Recent Labs  ?Lab 04/03/22 ?0453 04/04/22 ?0443 04/05/22 ?0459 04/06/22 ?0500 04/07/22 ?0315  ?NA 136 137 139 137 139  ?K 3.8 3.7 3.3* 4.0 3.7  ?CL 108 106 107 104 105  ?CO2 20* 23 25 27 28   ?GLUCOSE 167* 150* 143* 111* 101*  ?BUN 51* 54* 50* 46* 40*  ?CREATININE 1.11* 1.10* 1.04* 0.90 0.95  ?CALCIUM 8.2* 8.6* 8.4* 8.7* 9.1  ?MG 2.5* 2.6* 2.5* 2.4 2.4  ?PHOS  --   --   --  2.5 3.0  ? ?GFR ?Estimated Creatinine Clearance: 44.2 mL/min (by C-G formula based on SCr of 0.95 mg/dL). ?Liver Function Tests: ?Recent Labs  ?Lab 04/06/22 ?0500 04/07/22 ?0315  ?ALBUMIN 2.8* 2.9*  ? ?No results for input(s): LIPASE, AMYLASE in the last  168 hours. ?No results for input(s): AMMONIA in the last 168 hours. ?Coagulation profile ?No results for input(s): INR, PROTIME in the last 168 hours. ? ? ?CBC: ?Recent Labs  ?Lab 04/03/22 ?0453 04/04/22 ?0443 04/05/22 ?0459 04/06/22 ?0500 04/07/22 ?0315  ?WBC 1.7* 2.1* 2.4* 3.4* 4.1  ?NEUTROABS  --  1.0*  --   --   --   ?HGB 8.5* 9.3* 8.8* 9.1* 8.8*  ?HCT 27.2* 28.4* 27.6* 27.4* 26.1*  ?MCV 104.2* 102.2* 102.2* 101.1* 100.8*  ?PLT 101* 102* 88* 76* 70*  ? ?Cardiac Enzymes: ?No results for input(s): CKTOTAL, CKMB, CKMBINDEX, TROPONINI in the last 168 hours. ?BNP: ?Invalid input(s): POCBNP ?CBG: ?No results for input(s): GLUCAP in the last 168 hours. ? ?D-Dimer ?No results for input(s): DDIMER in the last 72 hours. ? ?Hgb A1c ?No results for input(s): HGBA1C in the last 72 hours. ?Lipid Profile ?No results for input(s): CHOL, HDL, LDLCALC, TRIG, CHOLHDL, LDLDIRECT in the last 72 hours. ?Thyroid function studies ?No results for input(s): TSH, T4TOTAL, T3FREE, THYROIDAB in the last 72 hours. ? ?Invalid input(s): FREET3 ? ?Anemia work up ?No results for input(s): VITAMINB12, FOLATE, FERRITIN, TIBC, IRON, RETICCTPCT in the last 72 hours. ?Microbiology ?Recent Results (from the past 240 hour(s))  ?Resp Panel by RT-PCR (Flu  A&B, Covid) Nasopharyngeal Swab     Status: None  ? Collection Time: 03/29/22  5:38 PM  ? Specimen: Nasopharyngeal Swab; Nasopharyngeal(NP) swabs in vial transport medium  ?Result Value Ref Range Status  ? SARS Coronavirus 2 by RT PCR NEGATIVE NEGATIVE Final  ?  Comment: (NOTE) ?SARS-CoV-2 target nucleic acids are NOT DETECTED. ? ?The SARS-CoV-2 RNA is generally detectable in upper respiratory ?specimens during the acute phase of infection. The lowest ?concentration of SARS-CoV-2 viral copies this assay can detect is ?138 copies/mL. A negative result does not preclude SARS-Cov-2 ?infection and should not be used as the sole basis for treatment or ?other patient management decisions. A negative result  may occur with  ?improper specimen collection/handling, submission of specimen other ?than nasopharyngeal swab, presence of viral mutation(s) within the ?areas targeted by this assay, and inadequate number of viral ?copies(<138 copies/mL). A negative result must be combined with ?clinical observations, patient history, and epidemiological ?information. The expected result is Negative. ? ?Fact Sheet for Patients:  ?EntrepreneurPulse.com.au ? ?Fact Sheet for Healthcare Providers:  ?IncredibleEmployment.be ? ?This test is no t yet approved or cleared by the [place] FDA and  ?has been authorized for detection and/or diagnosis of SARS-CoV-2 by ?FDA under an Emergency Use Authorization (EUA). This EUA will remain  ?in effect (meaning this test can be used) for the duration of the ?COVID-19 declaration under Section 564(b)(1) of the Act, 21 ?U.S.C.section 360bbb-3(b)(1), unless the authorization is terminated  ?or revoked sooner.  ? ? ?  ? Influenza A by PCR NEGATIVE NEGATIVE Final  ? Influenza B by PCR NEGATIVE NEGATIVE Final  ?  Comment: (NOTE) ?The Xpert Xpress SARS-CoV-2/FLU/RSV plus assay is intended as an aid ?in the diagnosis of influenza from Nasopharyngeal swab specimens and ?should not be used as a sole basis for treatment. Nasal washings and ?aspirates are unacceptable for Xpert Xpress SARS-CoV-2/FLU/RSV ?testing. ? ?Fact Sheet for Patients: ?EntrepreneurPulse.com.au ? ?Fact Sheet for Healthcare Providers: ?IncredibleEmployment.be ? ?This test is not yet approved or cleared by the [place] FDA and ?has been authorized for detection and/or diagnosis of SARS-CoV-2 by ?FDA under an Emergency Use Authorization (EUA). This EUA will remain ?in effect (meaning this test can be used) for the duration of the ?COVID-19 declaration under Section 564(b)(1) of the Act, 21 U.S.C. ?section 360bbb-3(b)(1), unless the authorization is terminated  or ?revoked. ? ?Performed at [facility], [address] ?[place], [place] [address] ?  ?MRSA Next Gen by PCR, Nasal     Status: None  ? Collection Time: 03/29/22  6:58 PM  ? Specimen: Nasal Mucosa; Nasal Swab  ?Result Value Ref Range Status  ? MRSA by PCR Next Gen NOT DETECTED NOT DETECTED Final  ?  Comment: (NOTE) ?The GeneXpert MRSA Assay (FDA approved for NASAL specimens only), ?is one component of a comprehensive MRSA colonization surveillance ?program. It is not intended to diagnose MRSA infection nor to guide ?or monitor treatment for MRSA infections. ?Test performance is not FDA approved in patients less than 2 years ?old. ?Performed at [facility], [address] ?[place], [place] [address] ?  ?Respiratory (~20 pathogens) panel by PCR     Status: None  ? Collection Time: 03/31/22  9:02 AM  ? Specimen: Nasopharyngeal Swab; Respiratory  ?Result Value Ref Range Status  ? Adenovirus NOT DETECTED NOT DETECTED Final  ? Coronavirus 229E NOT DETECTED NOT DETECTED Final  ?  Comment: (NOTE) ?The Coronavirus on the Respiratory Panel, DOES NOT test  for the novel  ?Coronavirus (2019 nCoV) ?  ? Coronavirus HKU1 NOT DETECTED NOT DETECTED Final  ? Coronavirus NL63 NOT DETECTED NOT DETECTED Final  ? Coronavirus OC43 NOT DETECTED NOT DETECTED Final  ? Metapneumovirus NOT DETECTED NOT DETECTED Final  ? Rhinovirus / Enterovirus NOT DETECTED NOT DETECTED Final  ? Influenza A NOT DETECTED NOT DETECTED Final  ? Influenza B NOT DETECTED NOT DETECTED Final  ? Parainfluenza Virus 1 NOT DETECTED NOT DETECTED Final  ? Parainfluenza Virus 2 NOT DETECTED NOT DETECTED Final  ? Parainfluenza Virus 3 NOT DETECTED NOT DETECTED Final  ? Parainfluenza Virus 4 NOT DETECTED NOT DETECTED Final  ? Respiratory Syncytial Virus NOT DETECTED NOT DETECTED Final  ? Bordetella pertussis NOT DETECTED NOT DETECTED Final  ? Bordetella Parapertussis NOT DETECTED NOT DETECTED Final  ? Chlamydophila pneumoniae NOT  DETECTED NOT DETECTED Final  ? Mycoplasma pneumoniae NOT DETECTED NOT DETECTED Final  ?  Comment: Performed at [facility], [place] [address] [place], [place] [address]  ? ? ? ? ?Studies:  ?No results fou

## 2022-04-07 NOTE — TOC Progression Note (Signed)
Transition of Care (TOC) - Progression Note  ? ? ?Patient Details  ?Name: [name] ?MRN: [id number] ?Date of Birth: 10-12-1946 ? ?Transition of Care (TOC) CM/SW Contact  ?Purcell Mouton, RN ?Phone Number: ?04/07/2022, 2:51 PM ? ?Clinical Narrative:    ? ?FL2 completed and faxed to SNF.  ? ? ?  ?  ? ?Expected Discharge Plan and Services ?  ?  ?  ?  ?  ?                ?  ?  ?  ?  ?  ?  ?  ?  ?  ?  ? ? ?Social Determinants of Health (SDOH) Interventions ?  ? ?Readmission Risk Interventions ?   ? View : No data to display.  ?  ?  ?  ? ? ?

## 2022-04-07 NOTE — NC FL2 (Signed)
?[place] MEDICAID FL2 LEVEL OF CARE SCREENING TOOL  ?  ? ?IDENTIFICATION  ?Patient Name: ?[name] Birthdate: [birth year] Sex: female Admission Date (Current Location): ?03/29/2022  ?[place] and [place] Number: ? [place] ?  Facility and Address:  ?[facility],  [place], [place] ?     Provider Number: ?[id number]  ?Attending Physician Name and Address:  ?Mercy Riding, MD ? Relative Name and Phone Number:  ?Calinda, [place] [phone] home,  [phone] cell ?   ?Current Level of Care: ?Hospital Recommended Level of Care: ?[place] Prior Approval Number: ?  ? ?Date Approved/Denied: ?  PASRR Number: ?[id number] ? ?Discharge Plan: ?SNF ?  ? ?Current Diagnoses: ?Patient Active Problem List  ? Diagnosis Date Noted  ? Malnutrition of moderate degree 04/06/2022  ? Chronic systolic CHF/cardiomyopathy/DOE 04/05/2022  ? Dyspnea 04/05/2022  ? Hypokalemia 04/05/2022  ? Physical deconditioning 04/05/2022  ? Chronic kidney disease, stage 3a ([place]) 04/05/2022  ? Constipation 04/05/2022  ? Paroxysmal atrial fibrillation with RVR ([place]) 03/29/2022  ? Multifocal pneumonia 03/29/2022  ? Pancytopenia ([place]) 03/29/2022  ? Bilateral pleural effusion 03/29/2022  ? Port-A-Cath in place 02/06/2022  ? 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 Atrial fibrillation ([place]) 10/04/2021  ? Stage 2 moderate COPD by GOLD classification ([place]) 05/03/2021  ? Hx of multiple pulmonary nodules 05/03/2021  ? Dermatochalasis 08/12/2019  ? 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 Anemia of chronic disease 07/06/2015  ? Thrombocytopenia ([place]) 02/04/2015  ? ? ?Orientation RESPIRATION BLADDER Height & Weight   ?  ?Self, Situation, Time, Place ? Normal Continent Weight: 55.9 kg ?Height:  5' 4"  (162.6 cm)  ?BEHAVIORAL SYMPTOMS/MOOD NEUROLOGICAL BOWEL NUTRITION STATUS  ?    Continent Diet (Heart Healthy)  ?AMBULATORY STATUS COMMUNICATION OF NEEDS Skin   ?Extensive Assist Verbally  Normal ?  ?  ?  ?    ?     ?     ? ? ?Personal Care Assistance Level of Assistance  ?Bathing, Feeding, Dressing Bathing Assistance: Maximum assistance ?Feeding assistance: Limited assistance ?Dressing Assistance: Maximum assistance ?   ? ?Functional Limitations Info  ?Sight, Hearing, Speech Sight Info: Impaired ?Hearing Info: Adequate ?Speech Info: Adequate  ? ? ?SPECIAL CARE FACTORS FREQUENCY  ?PT (By licensed PT), OT (By licensed OT)   ?  ?PT Frequency: x5 week ?OT Frequency: x5 week ?  ?  ?  ?   ? ? ?Contractures Contractures Info: Not present  ? ? ?Additional Factors Info  ?Code Status, Allergies Code Status Info: FULL ?Allergies Info: Gluten Meal, Penicillins, Alendronate, Hydroxychloroquine ?  ?  ?  ?   ? ?Current Medications (04/07/2022):  This is the current hospital active medication list ?Current Facility-Administered Medications  ?Medication Dose Route Frequency Provider Last Rate Last Admin  ? 0.9 %  sodium chloride infusion   Intravenous PRN Mercy Riding, MD   Stopped at 04/01/22 1557  ? acetaminophen (TYLENOL) tablet 650 mg  650 mg Oral Q6H PRN Mercy Riding, MD   650 mg at 04/05/22 1001  ? Or  ? acetaminophen (TYLENOL) suppository 650 mg  650 mg Rectal Q6H PRN Gonfa, Taye T, MD      ? albuterol (PROVENTIL) (2.5 MG/3ML) 0.083% nebulizer solution 2.5 mg  2.5 mg Nebulization Q2H PRN Wendee Beavers T, MD   2.5 mg at 04/02/22 1436  ? arformoterol (BROVANA) nebulizer solution 15 mcg  15 mcg Nebulization BID Mercy Riding, MD   15 mcg at  04/07/22 1135  ? And  ? umeclidinium bromide (INCRUSE ELLIPTA) 62.5 MCG/ACT 1 puff  1 puff Inhalation Daily Mercy Riding, MD   1 puff at 04/06/22 0726  ? bisacodyl (DULCOLAX) suppository 10 mg  10 mg Rectal Daily PRN Wendee Beavers T, MD   10 mg at 04/05/22 1026  ? budesonide (PULMICORT) nebulizer solution 0.5 mg  0.5 mg Nebulization Q12H Wendee Beavers T, MD   0.5 mg at 04/07/22 1136  ? Chlorhexidine Gluconate Cloth 2 % PADS 6 each  6 each Topical Daily Wendee Beavers T, MD   6 each  at 04/07/22 0908  ? diltiazem (CARDIZEM) tablet 90 mg  90 mg Oral Q6H Cherlynn Kaiser A, MD      ? feeding supplement (ENSURE ENLIVE / ENSURE PLUS) liquid 237 mL  237 mL Oral BID BM Gonfa, Taye T, MD      ? heparin ADULT infusion 100 units/mL (25000 units/251m)  750 Units/hr Intravenous Continuous GWendee BeaversT, MD 7.5 mL/hr at 04/07/22 0331 750 Units/hr at 04/07/22 0331  ? MEDLINE mouth rinse  15 mL Mouth Rinse BID GWendee BeaversT, MD   15 mL at 04/07/22 0908  ? metoprolol succinate (TOPROL-XL) 24 hr tablet 150 mg  150 mg Oral BID GWendee BeaversT, MD   150 mg at 04/07/22 0905  ? mirtazapine (REMERON) tablet 7.5 mg  7.5 mg Oral QHS GWendee BeaversT, MD   7.5 mg at 04/06/22 2141  ? multivitamin with minerals tablet 1 tablet  1 tablet Oral Daily GMercy Riding MD   1 tablet at 04/07/22 0905  ? ondansetron (ZOFRAN) tablet 4 mg  4 mg Oral Q6H PRN GWendee BeaversT, MD      ? Or  ? ondansetron (ZOFRAN) injection 4 mg  4 mg Intravenous Q6H PRN Gonfa, Taye T, MD      ? polyethylene glycol (MIRALAX / GLYCOLAX) packet 17 g  17 g Oral BID PRN GMercy Riding MD      ? senna-docusate (Senokot-S) tablet 1 tablet  1 tablet Oral BID GMercy Riding MD   1 tablet at 04/07/22 0905  ? sodium chloride flush (NS) 0.9 % injection 10-40 mL  10-40 mL Intracatheter Q12H GWendee BeaversT, MD   10 mL at 04/06/22 2142  ? sodium chloride flush (NS) 0.9 % injection 10-40 mL  10-40 mL Intracatheter PRN GWendee BeaversT, MD      ? sodium phosphate (FLEET) 7-19 GM/118ML enema 1 enema  1 enema Rectal Daily PRN GMercy Riding MD   1 enema at 04/05/22 1007  ? traZODone (DESYREL) tablet 100 mg  100 mg Oral QHS PRN GWendee BeaversT, MD   100 mg at 04/06/22 0005  ? ?Facility-Administered Medications Ordered in Other Encounters  ?Medication Dose Route Frequency Provider Last Rate Last Admin  ? sodium chloride flush (NS) 0.9 % injection 10 mL  10 mL Intracatheter PRN FTruitt Merle MD   10 mL at 03/14/22 1709  ? ? ? ?Discharge Medications: ?Please see discharge summary for a  list of discharge medications. ? ?Relevant Imaging Results: ? ?Relevant Lab Results: ? ? ?Additional Information ?SRV#[id number]? ?Endiya Klahr, RN ? ? ? ? ?

## 2022-04-07 NOTE — Progress Notes (Addendum)
? ?Progress Note ? ?Patient Name: [name] ?Date of Encounter: 04/07/2022 ? ?[facility] Cardiologist: Mertie Moores, MD  ? ?Subjective  ? ?Patient doing well this AM. Has some sob on exertion, but denies any dyspnea at rest or orthopnea. Continues to have some lower extremity edema and tenderness. Denies chest pain, palpitations, dizziness.  ? ?Inpatient Medications  ?  ?Scheduled Meds: ? arformoterol  15 mcg Nebulization BID  ? And  ? umeclidinium bromide  1 puff Inhalation Daily  ? budesonide (PULMICORT) nebulizer solution  0.5 mg Nebulization Q12H  ? Chlorhexidine Gluconate Cloth  6 each Topical Daily  ? diltiazem  60 mg Oral Q6H  ? feeding supplement  237 mL Oral BID BM  ? mouth rinse  15 mL Mouth Rinse BID  ? metoprolol succinate  150 mg Oral BID  ? mirtazapine  7.5 mg Oral QHS  ? multivitamin with minerals  1 tablet Oral Daily  ? senna-docusate  1 tablet Oral BID  ? sodium chloride flush  10-40 mL Intracatheter Q12H  ? ?Continuous Infusions: ? sodium chloride Stopped (04/01/22 1557)  ? heparin 750 Units/hr (04/07/22 0331)  ? ?PRN Meds: ?sodium chloride, acetaminophen **OR** acetaminophen, albuterol, bisacodyl, ondansetron **OR** ondansetron (ZOFRAN) IV, polyethylene glycol, sodium chloride flush, sodium phosphate, traZODone  ? ?Vital Signs  ?  ?Vitals:  ? 04/06/22 2052 04/06/22 2302 04/07/22 0500 04/07/22 0505  ?BP: (!) 142/75 120/90  (!) 141/75  ?Pulse: 84 (!) 107  86  ?Resp: 18   18  ?Temp: 97.9 ?F (36.6 ?C)   97.9 ?F (36.6 ?C)  ?TempSrc: Oral   Oral  ?SpO2: 93%   97%  ?Weight:   55.9 kg   ?Height:      ? ? ?Intake/Output Summary (Last 24 hours) at 04/07/2022 0910 ?Last data filed at 04/07/2022 0815 ?Gross per 24 hour  ?Intake 693.73 ml  ?Output 1550 ml  ?Net -856.27 ml  ? ? ?  04/07/2022  ?  5:00 AM 04/06/2022  ?  5:00 AM 04/05/2022  ?  5:00 AM  ?Last 3 Weights  ?Weight (lbs) 123 lb 3.8 oz 126 lb 8.7 oz 130 lb 11.7 oz  ?Weight (kg) 55.9 kg 57.4 kg 59.3 kg  ?   ? ?Telemetry  ?  ?Atrial fibrillation,  HR 70s-80s overnight with a few pauses all less than 2 seconds- Personally Reviewed ? ?ECG  ?  ?No new tracings since 4/11 - Personally Reviewed ? ?Physical Exam  ? ?GEN: No acute distress.  Laying comfortably in the bed  ?Neck: No JVD ?Cardiac: Irregular rate and rhythm, no murmurs, rubs, or gallops.  ?Respiratory: Clear to auscultation bilaterally. ?GI: Soft, nontender, non-distended  ?MS: Trace edema in BLE, tender to soft palpation  ?Neuro:  Nonfocal  ?Psych: Normal affect  ? ?Labs  ?  ?High Sensitivity Troponin:   ?Recent Labs  ?Lab 03/29/22 ?1403 03/29/22 ?1603  ?TROPONINIHS 20* 18*  ?   ?Chemistry ?Recent Labs  ?Lab 04/05/22 ?4982 04/06/22 ?0500 04/07/22 ?0315  ?NA 139 137 139  ?K 3.3* 4.0 3.7  ?CL 107 104 105  ?CO2 25 27 28   ?GLUCOSE 143* 111* 101*  ?BUN 50* 46* 40*  ?CREATININE 1.04* 0.90 0.95  ?CALCIUM 8.4* 8.7* 9.1  ?MG 2.5* 2.4 2.4  ?ALBUMIN  --  2.8* 2.9*  ?GFRNONAA 56* >60 >60  ?ANIONGAP 7 6 6   ?  ?Lipids No results for input(s): CHOL, TRIG, HDL, LABVLDL, LDLCALC, CHOLHDL in the last 168 hours.  ?Hematology ?Recent Labs  ?  Lab 04/05/22 ?3546 04/06/22 ?0500 04/07/22 ?0315  ?WBC 2.4* 3.4* 4.1  ?RBC 2.70* 2.71* 2.59*  ?HGB 8.8* 9.1* 8.8*  ?HCT 27.6* 27.4* 26.1*  ?MCV 102.2* 101.1* 100.8*  ?MCH 32.6 33.6 34.0  ?MCHC 31.9 33.2 33.7  ?RDW 16.1* 15.9* 15.8*  ?PLT 88* 76* 70*  ? ?Thyroid No results for input(s): TSH, FREET4 in the last 168 hours.  ?BNPNo results for input(s): BNP, PROBNP in the last 168 hours.  ?DDimer No results for input(s): DDIMER in the last 168 hours.  ? ?Radiology  ?  ?DG Abd Portable 1V ? ?Result Date: 04/05/2022 ?CLINICAL DATA:  Abdominal pain EXAM: PORTABLE ABDOMEN - 1 VIEW COMPARISON:  None. FINDINGS: Possible small right pleural effusion. No bowel dilatation to suggest obstruction. No evidence of pneumoperitoneum, portal venous gas or pneumatosis. No pathologic calcifications along the expected course of the ureters. No acute osseous abnormality. IMPRESSION: 1. No bowel obstruction.  2. Possible small right pleural effusion. Electronically Signed   By: Kathreen Devoid M.D.   On: 04/05/2022 13:38   ? ?Cardiac Studies  ? ?Echocardiogram 03/30/22 ? 1. Left ventricular ejection fraction, by estimation, is 45 to 50%. Left  ?ventricular ejection fraction by 2D MOD biplane is 48.0 %. The left  ?ventricle has mildly decreased function. The left ventricle demonstrates  ?global hypokinesis. Left ventricular  ?diastolic function could not be evaluated.  ? 2. Right ventricular systolic function is mildly reduced. The right  ?ventricular size is normal. There is mildly elevated pulmonary artery  ?systolic pressure. The estimated right ventricular systolic pressure is  ?56.8 mmHg.  ? 3. The mitral valve is abnormal. Mild to moderate mitral valve  ?regurgitation.  ? 4. The aortic valve is tricuspid. Aortic valve regurgitation is not  ?visualized. Aortic valve sclerosis is present, with no evidence of aortic  ?valve stenosis.  ? 5. The inferior vena cava is dilated in size with <50% respiratory  ?variability, suggesting right atrial pressure of 15 mmHg.  ? ?Comparison(s): Changes from prior study are noted. 10/27/2021: LVEF 60-65%.  ? ?Patient Profile  ?   ?76 y.o. female with a history of  paroxysmal atrial fibrillation not on anticoagulation, hypertension, hyperlipidemia, CKD stage III, COPD, CMML with associated pancytopenia, rheumatoid arthritis, and chronic back pain who was seen for evaluation of rapid atrial fibrillation. ? ?Assessment & Plan  ?  ?Paroxysmal Atrial Fibrillation  ?- Patient has been difficult to rate control this admission. Also does not have many treatment options as she has a prolonged QT and intermittently does not tolerate anticoagulation due to pancytopenia ?- Patient currently on 60 mg cardizem PO Q6hrs and 150 mg metoprolol BID. Although cardizem is not ideal given reduced EF, patient has very limited treatment options and cardizem+metoprolol are controlling her heart rate well   ?- Per  telemetry- Patient's HR was in the 70s-80s overnight with a few pauses all less than 2 seconds. Now patient is maintaining HR in the 80s-90s ?- Continues to be on IV heparin, hemoglobin and platelets stable (anticoagulation to be controlled by oncology)  ? ?Dyspnea on exertion ?Cardiomyopathy ?Hypertension ?- LVEF 40-45% this admission  ?- CTA chest negative for PE but showed moderate right and trace left pleural effusions with signs suspicious for multifocal PNA ?- ABX, solumedrol, and nebs per RT and primary ?- GDMT includes toprol - losartan on hold for now  ?- cardizem generally contraindicated with CM, but her rate control options are limited. BP is OK after transition to PO cardizem, but would  not uptitrate cardizem further  ?- Plan to add back losartan as BP tolerates, I would like to check her BP after she receives her morning medications before deciding to start today  ?- has had intermittent diuresis - has some lower extremity edema today, will give one dose of PO lasix 40 mg today  ?- will likely need access to PRN lasix for weight gain, maintain low sodium diet ? ?CMML ?Pancytopenia ?- Hb stable on heparin gtt ?- Oncology following  ?  ?Deconditioned ?- unclear what her functional status is currently ?- continue working with PT and evaluate for ongoing dizziness with ambulation ?- may need to consider orthostatics given reports of dizziness/blurry vision while in bed - consider CT head as well. ?   ? ?For questions or updates, please contact [place] ?Please consult [email or web address] for contact info under  ? ?  ?   ?Signed, ?Margie Billet, PA-C  ?04/07/2022, 9:10 AM   ? ?Patient seen and examined with KJ PA.  Agree as above, with the following exceptions and changes as noted below. Remains on heparin gtt. Off diltiazem infusion, increasing dose of oral diltiazem with the understanding that this is not the optimal therapy but there are no other alternative options for rate or rhythm control that  would be better.. Gen: NAD, CV: iRRR, no murmurs, Lungs: clear, Abd: soft, Extrem: Warm, well perfused, trace edema, Neuro/Psych: alert and oriented x 3, normal mood and affect. All available labs, radio

## 2022-04-07 NOTE — Progress Notes (Signed)
ANTICOAGULATION CONSULT NOTE  ? ?Pharmacy Consult for Heparin ?Indication: atrial fibrillation ? ?Allergies  ?Allergen Reactions  ? Gluten Meal Diarrhea  ? Penicillins Rash and Other (See Comments)  ?  'Many years ago' ?  ? Alendronate Rash  ? Hydroxychloroquine Rash  ? ? ?Patient Measurements: ?Height: 5' 4"  (162.6 cm) ?Weight: 55.9 kg (123 lb 3.8 oz) ?IBW/kg (Calculated) : 54.7 ?Heparin Dosing Weight: 57.6 kg ? ?Vital Signs: ?Temp: 97.9 ?F (36.6 ?C) (04/14 0505) ?Temp Source: Oral (04/14 0505) ?BP: 141/75 (04/14 0505) ?Pulse Rate: 86 (04/14 0505) ? ?Labs: ?Recent Labs  ?  04/05/22 ?0459 04/05/22 ?1500 04/06/22 ?0500 04/06/22 ?1038 04/06/22 ?1955 04/07/22 ?0315  ?HGB 8.8*  --  9.1*  --   --  8.8*  ?HCT 27.6*  --  27.4*  --   --  26.1*  ?PLT 88*  --  76*  --   --  70*  ?HEPARINUNFRC 0.85*   < >  --  0.42 0.38 0.41  ?CREATININE 1.04*  --  0.90  --   --  0.95  ? < > = values in this interval not displayed.  ? ? ? ?Estimated Creatinine Clearance: 44.2 mL/min (by C-G formula based on SCr of 0.95 mg/dL). ? ? 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  Has known hx afib on metoprolol and previously on Eliquis which was stopped on 4/3 due to pancytopenia and an episode of blood in the toilet bowl noted in 3/27 Oncology notes.  Pharmacy is consulted to dose IV heparin for afib. ? ?Last dose of Eliquis taken Monday 4/3 ? ?4/5: Baseline aPTT = 29, Heparin level = 0.27, PT/INR = 15.4/1.2 ?4/7: Heparin stopped ?4/10: OK to resume heparin per Dr Burr Medico.  No bolus per d/w Dr Starla Link ? ?04/07/2022  ?Heparin level 0.41 - therapeutic on IV heparin infusing at 750 units/hr ?Hgb stable ~ 9, PLT trending down (per Onc note, plan to hold anticoagulation for plt < 50) ?No bleeding or infusion related issues noted ? ?Goal of Therapy:  ?Heparin level 0.3-0.7 units/ml ?Monitor platelets by anticoagulation  protocol: Yes ?  ?Plan:  ?Continue heparin infusion at 750 units/hr ?Daily CBC and heparin level while on heparin ?F/u ability to resume PTA apixaban now that s/p bone marrow biopsy - TR[facility] deferring to Cardiology, Cardiology deferring to Oncology who last saw patient 4/10. ? ?Peggyann Juba, PharmD, BCPS ?Pharmacy: [phone] ?04/07/2022 6:54 AM ? ? ?

## 2022-04-07 NOTE — Care Management Important Message (Signed)
Important Message ? ?Patient Details IM Letter given to the Patient. ?Name: [name] ?MRN: [id number] ?Date of Birth: 11-26-1946 ? ? ?Medicare Important Message Given:  Yes ? ? ? ? ?Kerin Salen ?04/07/2022, 10:56 AM ?

## 2022-04-07 NOTE — Progress Notes (Signed)
?PROGRESS NOTE ? ?[name]:[id number] DOB: [birth year]  ? ?PCP: [name], MD ? ?Patient is from: Home ? ?DOA: 03/29/2022 LOS: 9 ? ?Chief complaints ?Chief Complaint  ?Patient presents with  ? Shortness of Breath  ?  ? ?Brief Narrative / Interim history: ? ?76 y.o. female with medical history significant of  CMML, currently on chemotherapy, pancytopenia, rheumatoid arthritis on Remicade, paroxysmal A-fib (off of Eliquis for the last 2 days prior to presentation?  Secondary to bleeding complications versus pancytopenia), hypertension, COPD (follows up with pulmonary as an outpatient), bilateral pulmonary nodules presented with worsening shortness of breath.  On presentation, she was found to be in A-fib with RVR and was started on IV Cardizem and heparin drips.  Cardiology consulted.  ? ?CTA chest was negative for PE but showed bilateral pleural effusion, more on the right with findings suggestive of multifocal pneumonia with numerous pleural-based right-sided masses, increased in size compared to previous CT. She was empirically started on IV antibiotics per oncology.  She was also started on IV steroid.  She underwent ultrasound-guided right-sided thoracentesis on 03/30/2022 with removal of 570 cc transudative fluid.  Fluid culture was not sent.  Cytology with reactive mesothelial cells.   ? ?Patient is stable on room air from respiratory standpoint.  However, she remains in A-fib with RVR although seems to be improving.  Cardiology following. ? ?Oncology following for CMML and pancytopenia  ? ?Subjective: ?Seen and examined earlier this morning.  No major events overnight of this morning.  No complaints other than lack of sleep due to multiple interruptions.  She denies chest pain, dyspnea, palpitation, dizziness, GI or UTI symptoms. ? ?Objective: ?Vitals:  ? 04/07/22 0500 04/07/22 0505 04/07/22 1137 04/07/22 1230  ?BP:  (!) 141/75  137/85  ?Pulse:  86  85  ?Resp:  18  16  ?Temp:  97.9 ?F (36.6 ?C)   98.1 ?F (36.7 ?C)  ?TempSrc:  Oral  Oral  ?SpO2:  97% 92% 92%  ?Weight: 55.9 kg     ?Height:      ? ? ?Examination: ? ?GENERAL: No apparent distress.  Nontoxic. ?HEENT: MMM.  Vision and hearing grossly intact.  ?NECK: Supple.  No apparent JVD.  ?RESP: Regular rhythm.  HR ranges from 80s to 100.  No IWOB.  Fair aeration bilaterally. ?CVS:  RRR. Heart sounds normal.  ?ABD/GI/GU: BS+. Abd soft, NTND.  ?MSK/EXT:  Moves extremities. No apparent deformity.  Trace BLE edema. ?SKIN: no apparent skin lesion or wound ?NEURO: Awake and alert. Oriented appropriately.  No apparent focal neuro deficit. ?PSYCH: Calm. Normal affect.  ? ?Procedures:  ?4/6-right thoracocentesis ? ?Microbiology summarized: ?COVID-19 and influenza PCR nonreactive. ?MRSA PCR nonreactive. ?Full RVP nonreactive. ? ?Assessment and Plan: ?* Paroxysmal atrial fibrillation with RVR ([place]) ?HR ranges from 80s to 100 this morning.  Still irregular.  TTE with LVEF of 45 to 50%, GH, indeterminate DD and RVSP of 38.  ?-Cardiology managing-increased p.o. Cardizem to 90 mg 4 times daily ?-Continue Toprol XL 150 mg twice daily ?-On IV heparin for anticoagulation.  Defer to long-term anticoagulation to cardiology and oncology ? ?Dyspnea on exertion ?Multifactorial including COPD, pleural effusion, A-fib with RVR, cardiomyopathy, anemia, pneumonia and physical deconditioning.  Improved.  Denies cardiopulmonary symptoms at rest. ?-Treat treatable causes.  ?-Completed antibiotic course for possible pneumonia and steroid course for COPD ?-S/p right thoracentesis with removal of 570 cc transudative fluid on 4/6.  Cytology-reactive mesothelial cells. ?-Reportedly did not tolerate Breztri, and has been on  Stiolto ?-Continue nebulizers, incentive spirometry, OOB, PT/OT ? ?Chronic systolic CHF/cardiomyopathy/DOE ?TTE as above.  CTA chest negative for PE but bilateral pleural effusion, Rt>Lt. Otherwise, appears euvolemic on exam.  Intermittently diuresed with IV Lasix.   Excellent urine output without diuretics. ?-Diuretics per cardiology-getting Lasix as needed. ?-Monitor fluid and respiratory status ?-Monitor renal functions and electrolytes. ? ?Malnutrition of moderate degree ?Body mass index is 21.72 kg/m?Marland Kitchen   ?Nutrition Problem: Moderate Malnutrition ?Etiology: chronic illness, cancer and cancer related treatments ?Signs/Symptoms: mild fat depletion, moderate muscle depletion, severe muscle depletion ?Interventions: Ensure Enlive (each supplement provides 350kcal and 20 grams of protein) ? ?Chronic kidney disease, stage 3a ([place]) ?Recent Labs  ?  03/29/22 ?1403 03/30/22 ?2878 03/31/22 ?0245 04/01/22 ?0500 04/02/22 ?0610 04/03/22 ?0453 04/04/22 ?0443 04/05/22 ?0459 04/06/22 ?0500 04/07/22 ?0315  ?BUN 27* 27* 37* 43* 49* 51* 54* 50* 46* 40*  ?CREATININE 0.93 0.83 1.17* 1.10* 1.09* 1.11* 1.10* 1.04* 0.90 0.95  ?Stable.  Continue monitoring ? ? ?Physical deconditioning ?PT/OT-recommended SNF. ? ?Pancytopenia ([place]) ?Likely from chemotherapy.  Leukopenia resolved.  Hgb stable.  Platelets slightly down. ?-Continue monitoring ?-Per oncology. ? 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 ? ?Constipation ?Resolved.   ?-Continue bowel regimen ? ?Hypokalemia ?K3.7.  Mg 2.4. ?-P.o. KCl 40x1 ? ?Bilateral pleural effusion ?See dyspnea ? ?Multifocal pneumonia ?Completed antibiotic course as above. ? ?Hx of multiple pulmonary nodules ?Increased in size on current CT.  She had bilateral pleural effusion.  However, pleural fluid cytology with reactive mesothelial cells. ?-May need repeat imaging in 4 to 6 weeks outpatient. ? ?Stage 2 moderate COPD by GOLD classification ([place]) ?See dyspnea ? ? ? ?DVT prophylaxis:  ?Place and maintain sequential compression device Start: 04/02/22 1610 ? ?Code Status: Full code ?Family Communication: Patient and/or RN. Available if any question.  ?Level of care: Telemetry ?Status is:  Inpatient ?Remains inpatient appropriate because: A-fib with RVR and safe disposition ? ? ?Final disposition: SNF ?Consultants:  ?Cardiology ?Oncology ? ?Sch Meds:  ?Scheduled Meds: ? arformoterol  15 mcg Nebulization BID  ? And  ? umeclidinium bromide  1 puff Inhalation Daily  ? budesonide (PULMICORT) nebulizer solution  0.5 mg Nebulization Q12H  ? Chlorhexidine Gluconate Cloth  6 each Topical Daily  ? diltiazem  90 mg Oral Q6H  ? feeding supplement  237 mL Oral BID BM  ? mouth rinse  15 mL Mouth Rinse BID  ? metoprolol succinate  150 mg Oral BID  ? mirtazapine  7.5 mg Oral QHS  ? multivitamin with minerals  1 tablet Oral Daily  ? senna-docusate  1 tablet Oral BID  ? sodium chloride flush  10-40 mL Intracatheter Q12H  ? ?Continuous Infusions: ? sodium chloride Stopped (04/01/22 1557)  ? heparin 750 Units/hr (04/07/22 0331)  ? ?PRN Meds:.sodium chloride, acetaminophen **OR** acetaminophen, albuterol, bisacodyl, ondansetron **OR** ondansetron (ZOFRAN) IV, polyethylene glycol, sodium chloride flush, sodium phosphate, traZODone ? ?Antimicrobials: ?Anti-infectives (From admission, onward)  ? ? Start     Dose/Rate Route Frequency Ordered Stop  ? 03/30/22 1400  ceFEPIme (MAXIPIME) 2 g in sodium chloride 0.9 % 100 mL IVPB  Status:  Discontinued       ? 2 g ?200 mL/hr over 30 Minutes Intravenous Every 12 hours 03/30/22 1025 04/03/22 1131  ? 03/30/22 0800  ceFEPIme (MAXIPIME) 2 g in sodium chloride 0.9 % 100 mL IVPB  Status:  Discontinued       ? 2 g ?200 mL/hr over 30  Minutes Intravenous Every 12 hours 03/29/22 1754 03/29/22 1757  ? 03/30/22 0200  ceFEPIme (MAXIPIME) 2 g in sodium chloride 0.9 % 100 mL IVPB  Status:  Discontinued       ? 2 g ?200 mL/hr over 30 Minutes Intravenous Every 8 hours 03/29/22 1757 03/30/22 1025  ? 03/29/22 1800  azithromycin (ZITHROMAX) 500 mg in sodium chloride 0.9 % 250 mL IVPB       ? 500 mg ?250 mL/hr over 60 Minutes Intravenous Every 24 hours 03/29/22 1747 04/02/22 1930  ? 03/29/22 1745   ceFEPIme (MAXIPIME) 2 g in sodium chloride 0.9 % 100 mL IVPB       ? 2 g ?200 mL/hr over 30 Minutes Intravenous  Once 03/29/22 1735 03/29/22 2132  ? ?  ? ? ? ?I have personally reviewed the following labs and images: ?CBC: ?Recent

## 2022-04-08 ENCOUNTER — Inpatient Hospital Stay (HOSPITAL_COMMUNITY): Payer: Medicare Other

## 2022-04-08 DIAGNOSIS — J90 Pleural effusion, not elsewhere classified: Secondary | ICD-10-CM | POA: Diagnosis not present

## 2022-04-08 DIAGNOSIS — S9032XA Contusion of left foot, initial encounter: Secondary | ICD-10-CM

## 2022-04-08 DIAGNOSIS — I480 Paroxysmal atrial fibrillation: Secondary | ICD-10-CM | POA: Diagnosis not present

## 2022-04-08 DIAGNOSIS — I5022 Chronic systolic (congestive) heart failure: Secondary | ICD-10-CM | POA: Diagnosis not present

## 2022-04-08 DIAGNOSIS — N1831 Chronic kidney disease, stage 3a: Secondary | ICD-10-CM | POA: Diagnosis not present

## 2022-04-08 LAB — CBC
HCT: 27.4 % — ABNORMAL LOW (ref 36.0–46.0)
Hemoglobin: 9 g/dL — ABNORMAL LOW (ref 12.0–15.0)
MCH: 33.2 pg (ref 26.0–34.0)
MCHC: 32.8 g/dL (ref 30.0–36.0)
MCV: 101.1 fL — ABNORMAL HIGH (ref 80.0–100.0)
Platelets: 65 10*3/uL — ABNORMAL LOW (ref 150–400)
RBC: 2.71 MIL/uL — ABNORMAL LOW (ref 3.87–5.11)
RDW: 15.9 % — ABNORMAL HIGH (ref 11.5–15.5)
WBC: 5.3 10*3/uL (ref 4.0–10.5)
nRBC: 0 % (ref 0.0–0.2)

## 2022-04-08 LAB — RENAL FUNCTION PANEL
Albumin: 3 g/dL — ABNORMAL LOW (ref 3.5–5.0)
Anion gap: 7 (ref 5–15)
BUN: 33 mg/dL — ABNORMAL HIGH (ref 8–23)
CO2: 30 mmol/L (ref 22–32)
Calcium: 9.5 mg/dL (ref 8.9–10.3)
Chloride: 101 mmol/L (ref 98–111)
Creatinine, Ser: 0.98 mg/dL (ref 0.44–1.00)
GFR, Estimated: >60 mL/min (ref >60)
Glucose, Bld: 94 mg/dL (ref 70–99)
Phosphorus: 3.7 mg/dL (ref 2.5–4.6)
Potassium: 3.7 mmol/L (ref 3.5–5.1)
Sodium: 138 mmol/L (ref 135–145)

## 2022-04-08 LAB — MAGNESIUM: Magnesium: 2.1 mg/dL (ref 1.7–2.4)

## 2022-04-08 LAB — HEPARIN LEVEL (UNFRACTIONATED): Heparin Unfractionated: 0.42 IU/mL (ref 0.30–0.70)

## 2022-04-08 MED ORDER — BOOST PLUS PO LIQD
237.0000 mL | Freq: Three times a day (TID) | ORAL | Status: DC
  Administered 2022-04-08 – 2022-04-13 (×13): 237 mL via ORAL
  Filled 2022-04-08 (×16): qty 237

## 2022-04-08 MED ORDER — POTASSIUM CHLORIDE CRYS ER 20 MEQ PO TBCR
40.0000 meq | EXTENDED_RELEASE_TABLET | Freq: Once | ORAL | Status: AC
  Administered 2022-04-08: 40 meq via ORAL
  Filled 2022-04-08: qty 2

## 2022-04-08 MED ORDER — GADOBUTROL 1 MMOL/ML IV SOLN
5.0000 mL | Freq: Once | INTRAVENOUS | Status: AC | PRN
  Administered 2022-04-08: 5 mL via INTRAVENOUS

## 2022-04-08 NOTE — Progress Notes (Signed)
Heparin stopped per MD. SRP, RN ?

## 2022-04-08 NOTE — Progress Notes (Signed)
ANTICOAGULATION CONSULT NOTE  ? ?Pharmacy Consult for Heparin ?Indication: atrial fibrillation ? ?Allergies  ?Allergen Reactions  ? Gluten Meal Diarrhea  ? Penicillins Rash and Other (See Comments)  ?  'Many years ago' ?  ? Alendronate Rash  ? Hydroxychloroquine Rash  ? ? ?Patient Measurements: ?Height: 5' 4" (162.6 cm) ?Weight: 52.1 kg (114 lb 13.8 oz) ?IBW/kg (Calculated) : 54.7 ?Heparin Dosing Weight: 57.6 kg ? ?Vital Signs: ?Temp: 98.4 ?F (36.9 ?C) (04/15 0432) ?Temp Source: Oral (04/15 0432) ?BP: 133/70 (04/15 0432) ?Pulse Rate: 55 (04/15 0432) ? ?Labs: ?Recent Labs  ?  04/06/22 ?0500 04/06/22 ?1038 04/06/22 ?1955 04/07/22 ?0315 04/08/22 ?0520  ?HGB 9.1*  --   --  8.8* 9.0*  ?HCT 27.4*  --   --  26.1* 27.4*  ?PLT 76*  --   --  70* 65*  ?HEPARINUNFRC  --    < > 0.38 0.41 0.42  ?CREATININE 0.90  --   --  0.95 0.98  ? < > = values in this interval not displayed.  ? ? ? ?Estimated Creatinine Clearance: 40.8 mL/min (by C-G formula based on SCr of 0.98 mg/dL). ? ? ?Assessment: ?75 yo female with CMML s/p 2 cycles azacitidine - last dose given 3/21 who presents with shortness of breath and HR ~200 for which she has been started on cardiazem drip.  Has known hx afib on metoprolol and previously on Eliquis which was stopped on 4/3 due to pancytopenia and an episode of blood in the toilet bowl noted in 3/27 Oncology notes.  Pharmacy is consulted to dose IV heparin for afib. ? ?Last dose of Eliquis taken Monday 4/3 ? ?4/5: Baseline aPTT = 29, Heparin level = 0.27, PT/INR = 15.4/1.2 ?4/7: Heparin stopped ?4/10: OK to resume heparin per Dr Feng.  No bolus per d/w Dr Alekh ? ?04/08/2022  ?Heparin level 0.42 - therapeutic on IV heparin infusing at 750 units/hr ?Hgb stable ~ 9, PLT trending down (per Onc note, plan to hold anticoagulation for plt < 50) ?No bleeding or infusion related issues noted ? ?Goal of Therapy:  ?Heparin level 0.3-0.7 units/ml ?Monitor platelets by anticoagulation protocol: Yes ?  ?Plan:  ?Continue  heparin infusion at 750 units/hr ?Daily CBC and heparin level while on heparin ?F/u ability to resume PTA apixaban now that s/p bone marrow biopsy - [facility] deferring to Cardiology, Cardiology deferring to Oncology. ? ? , PharmD, BCPS ?Pharmacy: [phone] ?04/08/2022 8:47 AM ? ? ?

## 2022-04-08 NOTE — Progress Notes (Signed)
Chart reviewed, patient not seen. D/w Dr. Cyndia Skeeters. ? ?Rates reasonable with diltiazem 90 mg Q6H. Continue today.  ? ?Concern for foot hematoma while on heparin. IV heparin has been held. Anticoagulation to be determined by Heme/Onc and IM, however from afib standpoint would favor anticoagulation when counts are stable and there are no concerns of bleeding for stroke risk reduction. Discussed earlier this week with Dr. Burr Medico who will primarily manage anticoagulation given underlying malignancy.  ? ?Cardiology will see tomorrow.  ?

## 2022-04-08 NOTE — Progress Notes (Signed)
?PROGRESS NOTE ? ?[name] WLN:[id number] DOB: 1946/08/29  ? ?PCP: [name], MD ? ?Patient is from: Home ? ?DOA: 03/29/2022 LOS: 10 ? ?Chief complaints ?Chief Complaint  ?Patient presents with  ? Shortness of Breath  ?  ? ?Brief Narrative / Interim history: ? ?76 y.o. female with medical history significant of  CMML, currently on chemotherapy, pancytopenia, rheumatoid arthritis on Remicade, paroxysmal A-fib (off of Eliquis for the last 2 days prior to presentation?  Secondary to bleeding complications versus pancytopenia), hypertension, COPD (follows up with pulmonary as an outpatient), bilateral pulmonary nodules presented with worsening shortness of breath.  On presentation, she was found to be in A-fib with RVR and was started on IV Cardizem and heparin drips. ? ?CTA chest was negative for PE but showed bilateral pleural effusion, more on the right with findings suggestive of multifocal pneumonia with numerous pleural-based right-sided masses, increased in size compared to previous CT. She was empirically started on IV antibiotics per oncology.  She was also started on IV steroid.  She underwent ultrasound-guided right-sided thoracentesis on 03/30/2022 with removal of 570 cc transudative fluid.  Fluid culture was not sent.  Cytology with reactive mesothelial cells.   ? ?Patient is stable on room air from respiratory standpoint.  However, she remains in A-fib with RVR although seems to be improving.  Cardiology following.  Heparin discontinued on 4/15 due to left dorsal foot hematoma and thrombocytopenia. ? ?Oncology following for CMML and pancytopenia  ? ?Subjective: ?Seen and examined earlier this morning.  No major events overnight of this morning.  No complaints other than pain in the left foot.  She has significant hematoma in left foot.  She denies trauma or injury.  She was on heparin for A-fib.  She has worsening thrombocytopenia.  She also have some bruising/hematoma under her tongue on the  right side and in the right gum. ? ?Objective: ?Vitals:  ? 04/08/22 0457 04/08/22 0601 04/08/22 0800 04/08/22 1239  ?BP:   124/64 118/62  ?Pulse:   61 95  ?Resp:   16 16  ?Temp:   98.5 ?F (36.9 ?C) 98.6 ?F (37 ?C)  ?TempSrc:   Oral Oral  ?SpO2:  95% 94% 92%  ?Weight: 52.1 kg     ?Height:      ? ? ?Examination: ? ?GENERAL: No apparent distress.  Nontoxic. ?HEENT: Small hematoma in her tongue on the right and right lower gum.  Vision and hearing grossly intact.  ?NECK: Supple.  No apparent JVD.  ?RESP:  No IWOB.  Fair aeration bilaterally.  Crackles over right lower lung field. ?CVS:  RRR. Heart sounds normal.  ?ABD/GI/GU: BS+. Abd soft, NTND.  ?MSK/EXT:  Moves extremities.  Hematoma over the dorsal aspect of left foot.  Fair and symmetric strength in both legs.  Significant muscle mass and subcu fat loss. ?SKIN: Hematoma over the dorsal aspect of left foot ?NEURO: Awake and alert. Oriented appropriately.  No apparent focal neuro deficit. ?PSYCH: Calm. Normal affect.  ? ?Procedures:  ?4/6-right thoracocentesis ? ?Microbiology summarized: ?COVID-19 and influenza PCR nonreactive. ?MRSA PCR nonreactive. ?Full RVP nonreactive. ? ?Assessment and Plan: ?* Paroxysmal atrial fibrillation with RVR ([place]) ?HR ranges from 80s to 100 this morning.  Still irregular.  TTE with LVEF of 45 to 50%, GH, indeterminate DD and RVSP of 38.  She is high risk for stroke but having hematoma and worsening thrombocytopenia on IV heparin. ?-Cardiology managing-increased p.o. Cardizem to 90 mg 4 times daily ?-Continue Toprol XL 150 mg  twice daily ?-Discontinue heparin ?-Optimize electrolytes ? ?Dyspnea on exertion ?Multifactorial including COPD, pleural effusion, A-fib with RVR, cardiomyopathy, anemia, pneumonia and physical deconditioning. Denies cardiopulmonary symptoms at rest.  Improving. ?-Treat treatable causes.  ?-Completed antibiotic course for possible pneumonia and steroid course for COPD ?-S/p right thoracentesis with removal of 570 cc  transudative fluid on 4/6.  Cytology-reactive mesothelial cells. ?-Reportedly did not tolerate Breztri, and has been on [employer] ?-Continue nebulizers, incentive spirometry, OOB, PT/OT ? ?Chronic systolic CHF/cardiomyopathy/DOE ?TTE as above.  CTA chest negative for PE but bilateral pleural effusion, Rt>Lt. Otherwise, appears euvolemic on exam.  Intermittently diuresed with IV Lasix.  Excellent urine output without diuretics. ?-Diuretics per cardiology-getting Lasix as needed. ?-Monitor fluid and respiratory status ?-Monitor renal functions and electrolytes. ? ?Hematoma of left foot ?See picture above. ?-Discontinue heparin. ?-Monitor closely ? ?Malnutrition of moderate degree ?Body mass index is 21.72 kg/m?Marland Kitchen   ?Nutrition Problem: Moderate Malnutrition ?Etiology: chronic illness, cancer and cancer related treatments ?Signs/Symptoms: mild fat depletion, moderate muscle depletion, severe muscle depletion ?Interventions: Ensure Enlive (each supplement provides 350kcal and 20 grams of protein) ? ?Chronic kidney disease, stage 3a ([place]) ?Recent Labs  ?  03/30/22 ?8786 03/31/22 ?0245 04/01/22 ?0500 04/02/22 ?0610 04/03/22 ?0453 04/04/22 ?0443 04/05/22 ?0459 04/06/22 ?0500 04/07/22 ?7672 04/08/22 ?0947  ?BUN 27* 37* 43* 49* 51* 54* 50* 46* 40* 33*  ?CREATININE 0.83 1.17* 1.10* 1.09* 1.11* 1.10* 1.04* 0.90 0.95 0.98  ?Stable.  Continue monitoring ? ? ?Physical deconditioning ?PT/OT-recommended SNF. ? ?Pancytopenia ([place]) ?Likely from chemotherapy.  Leukopenia resolved.  Hgb stable.  Platelets slowly trending down. ?-Discontinued heparin ?-Continue monitoring ?-Hematology/oncology on board. ? 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  Per oncology, her bone marrow biopsy earlier this week showed persistent CMML, but the blast was only 2%, slightly lower than before.  No other evidence of disease progression on the marrow ?-Oncology following. ? ?Constipation ?Resolved.    ?-Continue bowel regimen ? ?Hypokalemia ?K3.7.  Mg 2.1 ?-P.o. KCl 40x1 ? ?Bilateral pleural effusion ?See dyspnea ? ?Multifocal pneumonia ?Completed antibiotic course as above. ? ?Hx of multiple pulmonary nodules ?Increased in size on current CT.  She had bilateral pleural effusion.  However, pleural fluid cytology with reactive mesothelial cells. ?-May need repeat imaging in 4 to 6 weeks outpatient. ? ?Stage 2 moderate COPD by GOLD classification ([place]) ?See dyspnea ? ? ? ?DVT prophylaxis:  ?Place and maintain sequential compression device Start: 04/02/22 1610 ? ?Code Status: Full code ?Family Communication: Patient and/or RN. Available if any question.  ?Level of care: Telemetry ?Status is: Inpatient ?Remains inpatient appropriate because: A-fib with RVR and safe disposition ? ? ?Final disposition: SNF ?Consultants:  ?Cardiology ?Oncology ? ?Sch Meds:  ?Scheduled Meds: ? arformoterol  15 mcg Nebulization BID  ? And  ? umeclidinium bromide  1 puff Inhalation Daily  ? budesonide (PULMICORT) nebulizer solution  0.5 mg Nebulization Q12H  ? Chlorhexidine Gluconate Cloth  6 each Topical Daily  ? diltiazem  90 mg Oral Q6H  ? lactose free nutrition  237 mL Oral TID WC  ? mouth rinse  15 mL Mouth Rinse BID  ? metoprolol succinate  150 mg Oral BID  ? mirtazapine  7.5 mg Oral QHS  ? multivitamin with minerals  1 tablet Oral Daily  ? potassium chloride  40 mEq Oral Once  ? senna-docusate  1 tablet Oral BID  ? sodium chloride flush  10-40 mL Intracatheter Q12H  ? ?Continuous Infusions: ? sodium  chloride Stopped (04/01/22 1557)  ? ?PRN Meds:.sodium chloride, acetaminophen **OR** acetaminophen, albuterol, bisacodyl, dicyclomine, ondansetron **OR** ondansetron (ZOFRAN) IV, polyethylene glycol, sodium chloride flush, sodium phosphate, traZODone ? ?Antimicrobials: ?Anti-infectives (From admission, onward)  ? ? Start     Dose/Rate Route Frequency Ordered Stop  ? 03/30/22 1400  ceFEPIme (MAXIPIME) 2 g in sodium chloride 0.9 % 100 mL  IVPB  Status:  Discontinued       ? 2 g ?200 mL/hr over 30 Minutes Intravenous Every 12 hours 03/30/22 1025 04/03/22 1131  ? 03/30/22 0800  ceFEPIme (MAXIPIME) 2 g in sodium chloride 0.9 % 100 mL IVPB  Stat

## 2022-04-08 NOTE — Progress Notes (Signed)
Physical Therapy Treatment ?Patient Details ?Name: [name] ?MRN: [id number] ?DOB: Apr 04, 1946 ?Today's Date: 04/08/2022 ? ? ?History of Present Illness 76 y.o. female with medical history significant of  CMML, currently on chemotherapy, pancytopenia, rheumatoid arthritis on Remicade, paroxysmal A-fib (off of Eliquis for the last 2 days prior to presentation?  Secondary to bleeding complications versus pancytopenia), hypertension, COPD (follows up with pulmonary as an outpatient), bilateral pulmonary nodules presented with worsening shortness of breath. Found to be in A-fib with RVR.. CTA chest was negative for pulmonary embolism but showed bilateral pleural effusion, more on the right side with findings suggestive of multifocal pneumonia with numerous pleural-based right-sided masses ? ?  ?PT Comments  ? ? Patient initially reporting some bleeding from mouth. Patient has  bruising on her tongue. RN came I to inspect. PT bagan assisting  patient with mobility, patient reported that the left foot was painful. Sock on foot, noted edema on dorsum. Removed sock and noted  the appearance of a hematoma. RN notified. Mobility stopped to wait for MD to see patient's foot. Continue PT.  ?Recommendations for follow up therapy are one component of a multi-disciplinary discharge planning process, led by the attending physician.  Recommendations may be updated based on patient status, additional functional criteria and insurance authorization. ? ?Follow Up Recommendations ? Skilled nursing-short term rehab (<3 hours/day) ?  ?  ?Assistance Recommended at Discharge Frequent or constant Supervision/Assistance  ?Patient can return home with the following A little help with walking and/or transfers;A little help with bathing/dressing/bathroom;Assistance with cooking/housework;Assist for transportation;Help with stairs or ramp for entrance ?  ?Equipment Recommendations ?    ?  ?Recommendations for Other Services   ? ? ?   ?Precautions / Restrictions Precautions ?Precaution Comments: monitor HR and sat, afib,has a hematoma L dorsum foot  ?  ? ?Mobility ? Bed Mobility ?  ?Bed Mobility: Supine to Sit ?  ?  ?  ?  ?  ?General bed mobility comments: initiated moving to the bed edge when patient reported that left foot was painful, Removed sock and noted edema and hematoma looking area. RN notified and no further mobility untiil MD in ?  ? ?Transfers ?  ?  ?  ?  ?  ?  ?  ?  ?  ?  ?  ? ?Ambulation/Gait ?  ?  ?  ?  ?  ?  ?  ?  ? ? ?Stairs ?  ?  ?  ?  ?  ? ? ?Wheelchair Mobility ?  ? ?Modified Rankin (Stroke Patients Only) ?  ? ? ?  ?Balance   ?  ?  ?  ?  ?  ?  ?  ?  ?  ?  ?  ?  ?  ?  ?  ?  ?  ?  ?  ? ?  ?Cognition Arousal/Alertness: Awake/alert ?Behavior During Therapy: Anxious ?  ?  ?  ?  ?  ?  ?  ?  ?  ?  ?  ?  ?  ?  ?  ?  ?  ?General Comments: about the   bleeding from mouth and L foot pain ?  ?  ? ?  ?Exercises   ? ?  ?General Comments General comments (skin integrity, edema, etc.): sits aself upright in bed with no back support ?  ?  ? ?Pertinent Vitals/Pain Pain Assessment ?Pain Assessment: Faces ?Faces Pain Scale: Hurts even more ?Pain Location: L foot, noted hamatoma, has  bruising  on tongue and painful ?Pain Descriptors / Indicators: Discomfort ?Pain Intervention(s): Monitored during session  ? ? ?Home Living   ?  ?  ?  ?  ?  ?  ?  ?  ?  ?   ?  ?Prior Function    ?  ?  ?   ? ?PT Goals (current goals can now be found in the care plan section) Progress towards PT goals: Progressing toward goals ? ?  ?Frequency ? ? ? Min 2X/week ? ? ? ?  ?PT Plan Current plan remains appropriate;Frequency needs to be updated  ? ? ?Co-evaluation   ?  ?  ?  ?  ? ?  ?AM-PAC PT "6 Clicks" Mobility   ?Outcome Measure ? Help needed turning from your back to your side while in a flat bed without using bedrails?: A Little ?Help needed moving from lying on your back to sitting on the side of a flat bed without using bedrails?: A Little ?Help needed moving to  and from a bed to a chair (including a wheelchair)?: A Lot ?Help needed standing up from a chair using your arms (e.g., wheelchair or bedside chair)?: A Lot ?Help needed to walk in hospital room?: Total ?Help needed climbing 3-5 steps with a railing? : Total ?6 Click Score: 12 ? ?  ?End of Session   ?Activity Tolerance: Treatment limited secondary to medical complications (Comment) ?Patient left: in bed;with call bell/phone within reach;with bed alarm set ?Nurse Communication: Mobility status (left foot hematoma and pt reports bleeding from her tongue) ?PT Visit Diagnosis: Difficulty in walking, not elsewhere classified (R26.2);Muscle weakness (generalized) (M62.81);Unsteadiness on feet (R26.81) ?  ? ? ?Time: 7741-4239 ?PT Time Calculation (min) (ACUTE ONLY): 28 min ? ?Charges:  $Therapeutic Activity: 23-37 mins          ?          ? ?Tresa Endo PT ?Acute Rehabilitation Services ?Pager [phone] ?Office [phone] ? ? ? ?Christian Borgerding, Shella Maxim ?04/08/2022, 12:21 PM ? ?

## 2022-04-08 NOTE — Progress Notes (Addendum)
Pt noted with mouth blood bruising under tongue on right side and right gum near bridge set, blood bruise is at gum line of bridge. Left dorsal foot large blood bruise. SRP, RN ?

## 2022-04-08 NOTE — Assessment & Plan Note (Addendum)
Likely from heparin in the setting of thrombocytopenia.  Hematoma seems to be improving clinically. ?-Heparin on hold as of 4/15. ?-Tylenol as needed for pain ?-Lidoderm patch ?-Monitor closely ?

## 2022-04-09 DIAGNOSIS — I5022 Chronic systolic (congestive) heart failure: Secondary | ICD-10-CM | POA: Diagnosis not present

## 2022-04-09 DIAGNOSIS — J90 Pleural effusion, not elsewhere classified: Secondary | ICD-10-CM | POA: Diagnosis not present

## 2022-04-09 DIAGNOSIS — N1831 Chronic kidney disease, stage 3a: Secondary | ICD-10-CM | POA: Diagnosis not present

## 2022-04-09 DIAGNOSIS — I480 Paroxysmal atrial fibrillation: Secondary | ICD-10-CM | POA: Diagnosis not present

## 2022-04-09 LAB — CBC
HCT: 27.6 % — ABNORMAL LOW (ref 36.0–46.0)
Hemoglobin: 8.9 g/dL — ABNORMAL LOW (ref 12.0–15.0)
MCH: 32.8 pg (ref 26.0–34.0)
MCHC: 32.2 g/dL (ref 30.0–36.0)
MCV: 101.8 fL — ABNORMAL HIGH (ref 80.0–100.0)
Platelets: 63 10*3/uL — ABNORMAL LOW (ref 150–400)
RBC: 2.71 MIL/uL — ABNORMAL LOW (ref 3.87–5.11)
RDW: 15.9 % — ABNORMAL HIGH (ref 11.5–15.5)
WBC: 5.6 10*3/uL (ref 4.0–10.5)
nRBC: 0.4 % — ABNORMAL HIGH (ref 0.0–0.2)

## 2022-04-09 LAB — RENAL FUNCTION PANEL
Albumin: 3 g/dL — ABNORMAL LOW (ref 3.5–5.0)
Anion gap: 6 (ref 5–15)
BUN: 29 mg/dL — ABNORMAL HIGH (ref 8–23)
CO2: 28 mmol/L (ref 22–32)
Calcium: 9.4 mg/dL (ref 8.9–10.3)
Chloride: 102 mmol/L (ref 98–111)
Creatinine, Ser: 1.01 mg/dL — ABNORMAL HIGH (ref 0.44–1.00)
GFR, Estimated: 58 mL/min — ABNORMAL LOW (ref >60)
Glucose, Bld: 140 mg/dL — ABNORMAL HIGH (ref 70–99)
Phosphorus: 4.5 mg/dL (ref 2.5–4.6)
Potassium: 4.1 mmol/L (ref 3.5–5.1)
Sodium: 136 mmol/L (ref 135–145)

## 2022-04-09 LAB — MAGNESIUM: Magnesium: 1.9 mg/dL (ref 1.7–2.4)

## 2022-04-09 MED ORDER — SODIUM CHLORIDE 0.9% FLUSH: 10.0000 mL | INTRAVENOUS | Status: DC | PRN

## 2022-04-09 MED ORDER — LIDOCAINE 5 % EX PTCH
1.0000 | MEDICATED_PATCH | CUTANEOUS | Status: DC
  Administered 2022-04-11 – 2022-04-13 (×2): 1 via TRANSDERMAL
  Filled 2022-04-09 (×5): qty 1

## 2022-04-09 NOTE — TOC Progression Note (Signed)
Transition of Care (TOC) - Progression Note  ? ? ?Patient Details  ?Name: KANOE WANNER ?MRN: [id number] ?Date of Birth: 04-05-1946 ? ?Transition of Care (TOC) CM/SW Contact  ?Roseanne Kaufman, RN ?Phone Number: ?04/09/2022, 9:55 AM ? ?Clinical Narrative:    ?RNCM spoke with Edyth Gunnels patient's husband [phone] to advise of current SNF bed offers: [facility], [facility], [facility], [facility]. This RNCM advised patient's husband to review ratings on [email or web address]. Patient 's husband asking if he can pay more to have patient go to another facility, this RNCM advised this is the current bed offer. If he's interested in taking her to a different facility he can outreach that SNF. RNCM encouraged patient's husband to use review Medicare.gov as a Theatre manager.  ? ?No additional questions at this time. ?  ?  ? ?Expected Discharge Plan and Services ?  ? TOC Consult ?Discharge to SNF ?  ?  ? ?Social Determinants of Health (SDOH) Interventions ?  ? ?Readmission Risk Interventions ?   ? View : No data to display.  ?  ?  ?  ? ? ?

## 2022-04-09 NOTE — Progress Notes (Signed)
Pt up to [facility] had large BM post Dulcolax suppository, will transfer to chair after for dinner. Large inct urine output in bed, pure wick slipped. SRP, RN ?

## 2022-04-09 NOTE — Progress Notes (Signed)
?PROGRESS NOTE ? ?[name] IRS:[id number] DOB: May 19, 1946  ? ?PCP: [name], MD ? ?Patient is from: Home ? ?DOA: 03/29/2022 LOS: 11 ? ?Chief complaints ?Chief Complaint  ?Patient presents with  ? Shortness of Breath  ?  ? ?Brief Narrative / Interim history: ? ?76 y.o. female with medical history significant of  CMML, currently on chemotherapy, pancytopenia, rheumatoid arthritis on Remicade, paroxysmal A-fib (off of Eliquis for the last 2 days prior to presentation?  Secondary to bleeding complications versus pancytopenia), hypertension, COPD (follows up with pulmonary as an outpatient), bilateral pulmonary nodules presented with worsening shortness of breath.  On presentation, she was found to be in A-fib with RVR and was started on IV Cardizem and heparin drips. ? ?CTA chest was negative for PE but showed bilateral pleural effusion, more on the right with findings suggestive of multifocal pneumonia with numerous pleural-based right-sided masses, increased in size compared to previous CT. She was empirically started on IV antibiotics per oncology.  She was also started on IV steroid.  She underwent ultrasound-guided right-sided thoracentesis on 03/30/2022 with removal of 570 cc transudative fluid.  Fluid culture was not sent.  Cytology with reactive mesothelial cells.   ? ?Patient is stable on room air from respiratory standpoint.  However, she remains in A-fib with RVR although seems to be improving.  Cardiology following.  Heparin discontinued on 4/15 due to left dorsal foot hematoma and thrombocytopenia. ? ?Oncology following for CMML and pancytopenia  ? ?Subjective: ?Seen and examined earlier this morning.  No major events overnight of this morning.  No complaints other than left foot pain with weightbearing and some abdominal cramps.  Hematoma/swelling has improved.  She denies chest pain, dyspnea, palpitation or dizziness. ? ?Objective: ?Vitals:  ? 04/08/22 2034 04/08/22 2254 04/09/22 0500 04/09/22  0517  ?BP: 102/63 118/65  111/66  ?Pulse: 99 65  98  ?Resp: 16   16  ?Temp: 98.3 ?F (36.8 ?C)   98.1 ?F (36.7 ?C)  ?TempSrc: Oral   Oral  ?SpO2: 95%   96%  ?Weight:   54.3 kg   ?Height:      ? ? ?Examination: ? ?GENERAL: No apparent distress.  Nontoxic. ?HEENT: small hematoma in her tongue and right lower gum.  Vision and hearing grossly intact.  ?NECK: Supple.  No apparent JVD.  ?RESP:  No IWOB.  Fair aeration bilaterally. ?CVS:  RRR. Heart sounds normal.  ?ABD/GI/GU: BS+. Abd soft, NTND.  ?MSK/EXT:  Moves extremities.  Hematoma over the dorsal aspect of left foot (seems to be improving). ?SKIN: Hematoma as above. ?NEURO: Awake and alert. Oriented appropriately.  No apparent focal neuro deficit. ?PSYCH: Calm. Normal affect.  ? ?Procedures:  ?4/6-right thoracocentesis ? ?Microbiology summarized: ?COVID-19 and influenza PCR nonreactive. ?MRSA PCR nonreactive. ?Full RVP nonreactive. ? ?Assessment and Plan: ?* Paroxysmal atrial fibrillation with RVR ([place]) ?HR ranges 90s to 120s despite maximum dose of nodal blocking agents.  Remains in A-fib.  TTE with LVEF of 45 to 50%, GH, ?DD and RVSP of 38.  ?-Cardiology managing ?-Continue Toprol XL 150 mg twice daily and Cardizem 90 mg every 6 hours. ?-Discontinued heparin on 4/15 due to left foot hematoma ?-Optimize electrolytes ? ?Dyspnea on exertion ?Multifactorial including COPD, pleural effusion, A-fib with RVR, cardiomyopathy, anemia, pneumonia and physical deconditioning. Denies cardiopulmonary symptoms at rest.  Improving. ?-Treat treatable causes.  ?-Completed antibiotic course for possible pneumonia and steroid course for COPD ?-S/p right thoracentesis with removal of 570 cc transudative fluid on 4/6.  Cytology-reactive mesothelial cells. ?-Reportedly did not tolerate Breztri, and has been on [employer] ?-Continue nebulizers, incentive spirometry, OOB, PT/OT ? ?Chronic systolic CHF/cardiomyopathy/DOE ?TTE as above.  CTA chest negative for PE but bilateral pleural effusion,  Rt>Lt. Intermittently diuresed with IV Lasix.  Good urine output.  Appears euvolemic. ?-Diuretics per cardiology-getting Lasix as needed. ?-Monitor fluid and respiratory status ?-Monitor renal functions and electrolytes. ? ?Hematoma of left foot ?Likely from heparin in the setting of thrombocytopenia.  Improving. ?-Discontinued heparin on 4/15. ?-Tylenol as needed for pain ?-Monitor closely ? ?Malnutrition of moderate degree ?Body mass index is 21.72 kg/m?Marland Kitchen   ?Nutrition Problem: Moderate Malnutrition ?Etiology: chronic illness, cancer and cancer related treatments ?Signs/Symptoms: mild fat depletion, moderate muscle depletion, severe muscle depletion ?Interventions: Ensure Enlive (each supplement provides 350kcal and 20 grams of protein) ? ?Chronic kidney disease, stage 3a ([place]) ?Recent Labs  ?  03/31/22 ?0245 04/01/22 ?0500 04/02/22 ?0610 04/03/22 ?0453 04/04/22 ?0443 04/05/22 ?0459 04/06/22 ?0500 04/07/22 ?4970 04/08/22 ?2637 04/09/22 ?0844  ?BUN 37* 43* 49* 51* 54* 50* 46* 40* 33* 29*  ?CREATININE 1.17* 1.10* 1.09* 1.11* 1.10* 1.04* 0.90 0.95 0.98 1.01*  ?Stable.  Continue monitoring ? ? ?Physical deconditioning ?PT/OT-recommended SNF. ? ?Pancytopenia ([place]) ?Likely from chemotherapy.  Leukopenia resolved.  Hgb stable.  Platelets slowly trending down. ?-Discontinued heparin on 4/15 ?-Continue monitoring ?-Heme-onc on board ? ?CMML (chronic myelomonocytic leukemia) ([place]) ?Since she is on Remicade and low-dose prednisone.  Per oncology, her bone marrow biopsy earlier this week showed persistent CMML, but the blast was only 2%, slightly lower than before.  No other evidence of disease progression on the marrow.  ?-Oncology following. ? ?Constipation ?Resolved.   ?-Continue bowel regimen ? ?Hypokalemia ?Resolved. ? ?Bilateral pleural effusion ?See dyspnea ? ?Multifocal pneumonia ?Completed antibiotic course as above. ? ?Hx of multiple pulmonary nodules ?Increased in size on current CT.  She had bilateral pleural  effusion.  However, pleural fluid cytology with reactive mesothelial cells. ?-May need repeat imaging in 4 to 6 weeks outpatient. ? ?Stage 2 moderate COPD by GOLD classification ([place]) ?See dyspnea ? ? ? ?DVT prophylaxis:  ?Place and maintain sequential compression device Start: 04/02/22 1610 ? ?Code Status: Full code ?Family Communication: Updated patient's husband at bedside ?Level of care: Telemetry ?Status is: Inpatient ?Remains inpatient appropriate because: A-fib with RVR and safe disposition ? ? ?Final disposition: SNF ?Consultants:  ?Cardiology ?Oncology ? ?Sch Meds:  ?Scheduled Meds: ? arformoterol  15 mcg Nebulization BID  ? And  ? umeclidinium bromide  1 puff Inhalation Daily  ? budesonide (PULMICORT) nebulizer solution  0.5 mg Nebulization Q12H  ? Chlorhexidine Gluconate Cloth  6 each Topical Daily  ? diltiazem  90 mg Oral Q6H  ? lactose free nutrition  237 mL Oral TID WC  ? lidocaine  1 patch Transdermal Q24H  ? mouth rinse  15 mL Mouth Rinse BID  ? metoprolol succinate  150 mg Oral BID  ? mirtazapine  7.5 mg Oral QHS  ? multivitamin with minerals  1 tablet Oral Daily  ? senna-docusate  1 tablet Oral BID  ? sodium chloride flush  10-40 mL Intracatheter Q12H  ? ?Continuous Infusions: ? sodium chloride Stopped (04/01/22 1557)  ? ?PRN Meds:.sodium chloride, acetaminophen **OR** acetaminophen, albuterol, bisacodyl, dicyclomine, ondansetron **OR** ondansetron (ZOFRAN) IV, polyethylene glycol, sodium chloride flush, sodium chloride flush, sodium phosphate, traZODone ? ?Antimicrobials: ?Anti-infectives (From admission, onward)  ? ? Start     Dose/Rate Route Frequency Ordered Stop  ? 03/30/22 1400  ceFEPIme (MAXIPIME)  2 g in sodium chloride 0.9 % 100 mL IVPB  Status:  Discontinued       ? 2 g ?200 mL/hr over 30 Minutes Intravenous Every 12 hours 03/30/22 1025 04/03/22 1131  ? 03/30/22 0800  ceFEPIme (MAXIPIME) 2 g in sodium chloride 0.9 % 100 mL IVPB  Status:  Discontinued       ? 2 g ?200 mL/hr over 30 Minutes  Intravenous Every 12 hours 03/29/22 1754 03/29/22 1757  ? 03/30/22 0200  ceFEPIme (MAXIPIME) 2 g in sodium chloride 0.9 % 100 mL IVPB  Status:  Discontinued       ? 2 g ?200 mL/hr over 30 Minutes Intraven

## 2022-04-09 NOTE — Progress Notes (Signed)
RNCM received inbound call from patient's husband Elasia Furnish. Patient's husband states he will pay out of pocket for a better facility. Mr. 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 This RNCM advised patient to call tomorrow, due to the requested facilities don't have any available admissions coordinators today. Patient's husband agreed.  ? ?TOC will continue to follow ?

## 2022-04-09 NOTE — Plan of Care (Signed)
?  Problem: Education: ?Goal: Knowledge of disease or condition will improve ?Outcome: Progressing ?Goal: Knowledge of the prescribed therapeutic regimen will improve ?Outcome: Progressing ?Goal: Individualized Educational Video(s) ?Outcome: Progressing ?  ?Problem: Activity: ?Goal: Ability to tolerate increased activity will improve ?Outcome: Progressing ?Goal: Will verbalize the importance of balancing activity with adequate rest periods ?Outcome: Progressing ?  ?

## 2022-04-09 NOTE — Progress Notes (Signed)
? ?Progress Note ? ?Patient Name: [name] ?Date of Encounter: 04/09/2022 ? ?[facility] Cardiologist: Mertie Moores, MD  ? ?Subjective  ? ?Denies chest pain, palpitations, dizziness.  ? ?Inpatient Medications  ?  ?Scheduled Meds: ? arformoterol  15 mcg Nebulization BID  ? And  ? umeclidinium bromide  1 puff Inhalation Daily  ? budesonide (PULMICORT) nebulizer solution  0.5 mg Nebulization Q12H  ? Chlorhexidine Gluconate Cloth  6 each Topical Daily  ? diltiazem  90 mg Oral Q6H  ? lactose free nutrition  237 mL Oral TID WC  ? lidocaine  1 patch Transdermal Q24H  ? mouth rinse  15 mL Mouth Rinse BID  ? metoprolol succinate  150 mg Oral BID  ? mirtazapine  7.5 mg Oral QHS  ? multivitamin with minerals  1 tablet Oral Daily  ? senna-docusate  1 tablet Oral BID  ? sodium chloride flush  10-40 mL Intracatheter Q12H  ? ?Continuous Infusions: ? sodium chloride Stopped (04/01/22 1557)  ? ?PRN Meds: ?sodium chloride, acetaminophen **OR** acetaminophen, albuterol, bisacodyl, dicyclomine, ondansetron **OR** ondansetron (ZOFRAN) IV, polyethylene glycol, sodium chloride flush, sodium chloride flush, sodium phosphate, traZODone  ? ?Vital Signs  ?  ?Vitals:  ? 04/08/22 2034 04/08/22 2254 04/09/22 0500 04/09/22 0517  ?BP: 102/63 118/65  111/66  ?Pulse: 99 65  98  ?Resp: 16   16  ?Temp: 98.3 ?F (36.8 ?C)   98.1 ?F (36.7 ?C)  ?TempSrc: Oral   Oral  ?SpO2: 95%   96%  ?Weight:   54.3 kg   ?Height:      ? ? ?Intake/Output Summary (Last 24 hours) at 04/09/2022 1157 ?Last data filed at 04/09/2022 4481 ?Gross per 24 hour  ?Intake --  ?Output 1000 ml  ?Net -1000 ml  ? ? ?  04/09/2022  ?  5:00 AM 04/08/2022  ?  4:57 AM 04/07/2022  ?  5:00 AM  ?Last 3 Weights  ?Weight (lbs) 119 lb 11.4 oz 114 lb 13.8 oz 123 lb 3.8 oz  ?Weight (kg) 54.3 kg 52.1 kg 55.9 kg  ?   ? ?Telemetry  ?  ?Atrial fibrillation, HR 70s-80s overnight with a few pauses all less than 2 seconds- Personally Reviewed ? ?ECG  ?  ?No new tracings since 4/11 - Personally  Reviewed ? ?Physical Exam  ? ?GEN: No acute distress.  Laying comfortably in the bed  ?Neck: No JVD ?Cardiac: Irregular rate and rhythm, no murmurs, rubs, or gallops.  ?Respiratory: Clear to auscultation bilaterally. ?GI: Soft, nontender, non-distended  ?MS: Trace edema in BLE, tender to soft palpation. Left dorsum of foot hematoma. ?Neuro:  Nonfocal  ?Psych: Normal affect  ? ?Labs  ?  ?High Sensitivity Troponin:   ?Recent Labs  ?Lab 03/29/22 ?1403 03/29/22 ?1603  ?TROPONINIHS 20* 18*  ?   ?Chemistry ?Recent Labs  ?Lab 04/07/22 ?8563 04/08/22 ?1497 04/09/22 ?0844  ?NA 139 138 136  ?K 3.7 3.7 4.1  ?CL 105 101 102  ?CO2 28 30 28   ?GLUCOSE 101* 94 140*  ?BUN 40* 33* 29*  ?CREATININE 0.95 0.98 1.01*  ?CALCIUM 9.1 9.5 9.4  ?MG 2.4 2.1 1.9  ?ALBUMIN 2.9* 3.0* 3.0*  ?GFRNONAA >60 >60 58*  ?ANIONGAP 6 7 6   ?  ?Lipids No results for input(s): CHOL, TRIG, HDL, LABVLDL, LDLCALC, CHOLHDL in the last 168 hours.  ?Hematology ?Recent Labs  ?Lab 04/07/22 ?0263 04/08/22 ?7858 04/09/22 ?0844  ?WBC 4.1 5.3 5.6  ?RBC 2.59* 2.71* 2.71*  ?HGB 8.8* 9.0* 8.9*  ?  HCT 26.1* 27.4* 27.6*  ?MCV 100.8* 101.1* 101.8*  ?MCH 34.0 33.2 32.8  ?MCHC 33.7 32.8 32.2  ?RDW 15.8* 15.9* 15.9*  ?PLT 70* 65* 63*  ? ?Thyroid No results for input(s): TSH, FREET4 in the last 168 hours.  ?BNPNo results for input(s): BNP, PROBNP in the last 168 hours.  ?DDimer No results for input(s): DDIMER in the last 168 hours.  ? ?Radiology  ?  ?MR Lumbar Spine W Wo Contrast ? ?Result Date: 04/08/2022 ?CLINICAL DATA:  76 year old female with bilateral lower extremity weakness. Chronic leukemia. Presacral mass. Recent CT-guided right ilium bone marrow aspiration. EXAM: MRI LUMBAR SPINE WITHOUT AND WITH CONTRAST TECHNIQUE: Multiplanar and multiecho pulse sequences of the lumbar spine were obtained without and with intravenous contrast. CONTRAST:  58m GADAVIST GADOBUTROL 1 MMOL/ML IV SOLN COMPARISON:  Lumbar MRI 06/28/2021. CT-guided bone marrow biopsy 04/04/2022. FINDINGS:  Segmentation: Lumbar segmentation appears to be normal and is the same numbering system used on the MRI last year. Alignment: Chronic dextroconvex lumbar scoliosis. Stable lumbar lordosis from last year. Vertebrae: Degenerative endplate changes but no suspicious or enhancing marrow lesion identified in the visible lower thoracic or lumbar spine. Visible sacrum bone marrow signal also appears to remain normal, although there is a partially visible lobulated and enhancing at least 5.5 cm presacral soft tissue mass abutting the ventral sacral cortex (series 14 image 10). Linear edema and enhancement of the right ilium from recent bone marrow aspiration (series 14, image 20). Conus medullaris and cauda equina: Conus extends to the L1 level. No lower spinal cord or conus signal abnormality. There is no visible lower thoracic spinal stenosis. No abnormal intradural enhancement or dural thickening identified. Paraspinal and other soft tissues: Negative visible paraspinal soft tissues outside of the presacral mass described above. Diverticulosis of large bowel in the pelvis. Disc levels: Advanced lumbar spine degeneration and multifactorial spinal, lateral recess, and foraminal stenosis at L2-L3 and L3-L4 has not significantly changed from last year, in part related to chronic left caudal disc extrusion at L2-L3 (series 11, image 17). Similar stable chronic right lateral recess stenosis and right foraminal stenosis at L4-L5. IMPRESSION: 1. No metastatic disease identified in the lower thoracic or lumbar spine. Chronic presacral soft tissue tumor, but the visible sacral bone marrow signal remains normal. 2. Normal visible lower thoracic spinal cord at T11 and T12. Normal conus at L1. 3. Advanced chronic lumbar spine degeneration not significantly changed from last year, with chronic disc herniation contributing to spinal, lateral recess, and foraminal stenosis at L2-L3, L3-L4, and L4-L5. Electronically Signed   By: HGenevie Ann M.D.   On: 04/08/2022 12:18   ? ?Cardiac Studies  ? ?Echocardiogram 03/30/22 ? 1. Left ventricular ejection fraction, by estimation, is 45 to 50%. Left  ?ventricular ejection fraction by 2D MOD biplane is 48.0 %. The left  ?ventricle has mildly decreased function. The left ventricle demonstrates  ?global hypokinesis. Left ventricular  ?diastolic function could not be evaluated.  ? 2. Right ventricular systolic function is mildly reduced. The right  ?ventricular size is normal. There is mildly elevated pulmonary artery  ?systolic pressure. The estimated right ventricular systolic pressure is  ?393.7mmHg.  ? 3. The mitral valve is abnormal. Mild to moderate mitral valve  ?regurgitation.  ? 4. The aortic valve is tricuspid. Aortic valve regurgitation is not  ?visualized. Aortic valve sclerosis is present, with no evidence of aortic  ?valve stenosis.  ? 5. The inferior vena cava is dilated in size with <50%  respiratory  ?variability, suggesting right atrial pressure of 15 mmHg.  ? ?Comparison(s): Changes from prior study are noted. 10/27/2021: LVEF 60-65%.  ? ?Patient Profile  ?   ?76 y.o. female with a history of  paroxysmal atrial fibrillation not on anticoagulation, hypertension, hyperlipidemia, CKD stage III, COPD, CMML with associated pancytopenia, rheumatoid arthritis, and chronic back pain who was seen for evaluation of rapid atrial fibrillation. ? ?Assessment & Plan  ?  ?Paroxysmal Atrial Fibrillation  ?- Patient has been difficult to rate control this admission. Also does not have many treatment options as she has a prolonged QT and intermittently does not tolerate anticoagulation due to pancytopenia and now has concerns of bleeding with foot hematoma and some oral bleeding. ?- Patient currently on 90 mg cardizem PO Q6hrs and 150 mg metoprolol BID. Although cardizem is not ideal given reduced EF, patient has very limited treatment options and cardizem+metoprolol are controlling her heart rate reasonably well. Does  have elevated rates at times, permissible of BP remains stable. ?- off heparin for concern of bleeding. ?- dizziness may be related to afib when in RVR. ? ?Dyspnea on exertion ?Cardiomyopathy ?Hypertension ?- LVEF

## 2022-04-10 ENCOUNTER — Inpatient Hospital Stay: Payer: Medicare Other

## 2022-04-10 DIAGNOSIS — I480 Paroxysmal atrial fibrillation: Secondary | ICD-10-CM | POA: Diagnosis not present

## 2022-04-10 DIAGNOSIS — I5022 Chronic systolic (congestive) heart failure: Secondary | ICD-10-CM | POA: Diagnosis not present

## 2022-04-10 DIAGNOSIS — J90 Pleural effusion, not elsewhere classified: Secondary | ICD-10-CM | POA: Diagnosis not present

## 2022-04-10 DIAGNOSIS — N1831 Chronic kidney disease, stage 3a: Secondary | ICD-10-CM | POA: Diagnosis not present

## 2022-04-10 LAB — CBC
HCT: 24.7 % — ABNORMAL LOW (ref 36.0–46.0)
HCT: 23.7 % — ABNORMAL LOW (ref 36.0–46.0)
Hemoglobin: 7.8 g/dL — ABNORMAL LOW (ref 12.0–15.0)
Hemoglobin: 7.7 g/dL — ABNORMAL LOW (ref 12.0–15.0)
MCH: 32.5 pg (ref 26.0–34.0)
MCH: 33.5 pg (ref 26.0–34.0)
MCHC: 31.6 g/dL (ref 30.0–36.0)
MCHC: 32.5 g/dL (ref 30.0–36.0)
MCV: 102.9 fL — ABNORMAL HIGH (ref 80.0–100.0)
MCV: 103 fL — ABNORMAL HIGH (ref 80.0–100.0)
Platelets: 56 10*3/uL — ABNORMAL LOW (ref 150–400)
Platelets: 55 10*3/uL — ABNORMAL LOW (ref 150–400)
RBC: 2.4 MIL/uL — ABNORMAL LOW (ref 3.87–5.11)
RBC: 2.3 MIL/uL — ABNORMAL LOW (ref 3.87–5.11)
RDW: 15.9 % — ABNORMAL HIGH (ref 11.5–15.5)
RDW: 16 % — ABNORMAL HIGH (ref 11.5–15.5)
WBC: 4.8 10*3/uL (ref 4.0–10.5)
WBC: 5.7 10*3/uL (ref 4.0–10.5)
nRBC: 0 % (ref 0.0–0.2)
nRBC: 0 % (ref 0.0–0.2)

## 2022-04-10 LAB — RENAL FUNCTION PANEL
Albumin: 2.8 g/dL — ABNORMAL LOW (ref 3.5–5.0)
Anion gap: 7 (ref 5–15)
BUN: 26 mg/dL — ABNORMAL HIGH (ref 8–23)
CO2: 28 mmol/L (ref 22–32)
Calcium: 9.3 mg/dL (ref 8.9–10.3)
Chloride: 102 mmol/L (ref 98–111)
Creatinine, Ser: 0.99 mg/dL (ref 0.44–1.00)
GFR, Estimated: 59 mL/min — ABNORMAL LOW (ref >60)
Glucose, Bld: 95 mg/dL (ref 70–99)
Phosphorus: 5.5 mg/dL — ABNORMAL HIGH (ref 2.5–4.6)
Potassium: 4.2 mmol/L (ref 3.5–5.1)
Sodium: 137 mmol/L (ref 135–145)

## 2022-04-10 LAB — MAGNESIUM: Magnesium: 1.9 mg/dL (ref 1.7–2.4)

## 2022-04-10 MED ORDER — DILTIAZEM HCL 60 MG PO TABS
90.0000 mg | ORAL_TABLET | Freq: Four times a day (QID) | ORAL | Status: AC
  Administered 2022-04-10 – 2022-04-11 (×3): 90 mg via ORAL
  Filled 2022-04-10 (×3): qty 1

## 2022-04-10 MED ORDER — DILTIAZEM HCL ER COATED BEADS 180 MG PO CP24
360.0000 mg | ORAL_CAPSULE | Freq: Every day | ORAL | Status: DC
  Administered 2022-04-11 – 2022-04-13 (×3): 360 mg via ORAL
  Filled 2022-04-10 (×3): qty 2

## 2022-04-10 NOTE — Progress Notes (Addendum)
? ?Progress Note ? ?Patient Name: [name] ?Date of Encounter: 04/10/2022 ? ?[facility] Cardiologist: Mertie Moores, MD  ? ?Subjective  ? ?Denies any chest pain, shortness of breath, palpitations or dizziness.  She does complain of ongoing fatigue ? ?Inpatient Medications  ?  ?Scheduled Meds: ? arformoterol  15 mcg Nebulization BID  ? And  ? umeclidinium bromide  1 puff Inhalation Daily  ? budesonide (PULMICORT) nebulizer solution  0.5 mg Nebulization Q12H  ? Chlorhexidine Gluconate Cloth  6 each Topical Daily  ? diltiazem  90 mg Oral Q6H  ? lactose free nutrition  237 mL Oral TID WC  ? lidocaine  1 patch Transdermal Q24H  ? mouth rinse  15 mL Mouth Rinse BID  ? metoprolol succinate  150 mg Oral BID  ? mirtazapine  7.5 mg Oral QHS  ? multivitamin with minerals  1 tablet Oral Daily  ? senna-docusate  1 tablet Oral BID  ? sodium chloride flush  10-40 mL Intracatheter Q12H  ? ?Continuous Infusions: ? sodium chloride Stopped (04/01/22 1557)  ? ?PRN Meds: ?sodium chloride, acetaminophen **OR** acetaminophen, albuterol, bisacodyl, dicyclomine, ondansetron **OR** ondansetron (ZOFRAN) IV, polyethylene glycol, sodium chloride flush, sodium chloride flush, sodium phosphate, traZODone  ? ?Vital Signs  ?  ?Vitals:  ? 04/10/22 0412 04/10/22 0500 04/10/22 0807 04/10/22 0927  ?BP:  117/74  (!) 113/59  ?Pulse:    79  ?Resp:      ?Temp:      ?TempSrc:      ?SpO2:   95%   ?Weight: 56 kg     ?Height:      ? ? ?Intake/Output Summary (Last 24 hours) at 04/10/2022 1056 ?Last data filed at 04/10/2022 7106 ?Gross per 24 hour  ?Intake 240 ml  ?Output 250 ml  ?Net -10 ml  ? ? ? ?  04/10/2022  ?  4:12 AM 04/09/2022  ?  5:00 AM 04/08/2022  ?  4:57 AM  ?Last 3 Weights  ?Weight (lbs) 123 lb 7.3 oz 119 lb 11.4 oz 114 lb 13.8 oz  ?Weight (kg) 56 kg 54.3 kg 52.1 kg  ?   ? ?Telemetry  ?  ?Atrial fibrillation with controlled ventricular response with heart rate 95 to 108 bpm- Personally Reviewed ? ?ECG  ?  ?No new tracings since 4/11 -  Personally Reviewed ? ?Physical Exam  ? ?GEN: Well nourished, well developed in no acute distress ?HEENT: Normal ?NECK: No JVD; No carotid bruits ?LYMPHATICS: No lymphadenopathy ?CARDIAC: Irregularly irregular, no murmurs, rubs, gallops ?RESPIRATORY:  Clear to auscultation without rales, wheezing or rhonchi  ?ABDOMEN: Soft, non-tender, non-distended ?MUSCULOSKELETAL:  No edema; No deformity  ?SKIN: Warm and dry.  Left foot with hematoma on the dorsal surface ?NEUROLOGIC:  Alert and oriented x 3 ?PSYCHIATRIC:  Normal affect   ?Labs  ?  ?High Sensitivity Troponin:   ?Recent Labs  ?Lab 03/29/22 ?1403 03/29/22 ?1603  ?TROPONINIHS 20* 18*  ? ?   ?Chemistry ?Recent Labs  ?Lab 04/08/22 ?2694 04/09/22 ?8546 04/10/22 ?0448  ?NA 138 136 137  ?K 3.7 4.1 4.2  ?CL 101 102 102  ?CO2 30 28 28   ?GLUCOSE 94 140* 95  ?BUN 33* 29* 26*  ?CREATININE 0.98 1.01* 0.99  ?CALCIUM 9.5 9.4 9.3  ?MG 2.1 1.9 1.9  ?ALBUMIN 3.0* 3.0* 2.8*  ?GFRNONAA >60 58* 59*  ?ANIONGAP 7 6 7   ? ?  ?Lipids No results for input(s): CHOL, TRIG, HDL, LABVLDL, LDLCALC, CHOLHDL in the last 168 hours.  ?Hematology ?  Recent Labs  ?Lab 04/09/22 ?1610 04/10/22 ?0448 04/10/22 ?1014  ?WBC 5.6 4.8 5.7  ?RBC 2.71* 2.40* 2.30*  ?HGB 8.9* 7.8* 7.7*  ?HCT 27.6* 24.7* 23.7*  ?MCV 101.8* 102.9* 103.0*  ?MCH 32.8 32.5 33.5  ?MCHC 32.2 31.6 32.5  ?RDW 15.9* 15.9* 16.0*  ?PLT 63* 56* 55*  ? ? ?Thyroid No results for input(s): TSH, FREET4 in the last 168 hours.  ?BNPNo results for input(s): BNP, PROBNP in the last 168 hours.  ?DDimer No results for input(s): DDIMER in the last 168 hours.  ? ?Radiology  ?  ?MR Lumbar Spine W Wo Contrast ? ?Result Date: 04/08/2022 ?CLINICAL DATA:  76 year old female with bilateral lower extremity weakness. Chronic leukemia. Presacral mass. Recent CT-guided right ilium bone marrow aspiration. EXAM: MRI LUMBAR SPINE WITHOUT AND WITH CONTRAST TECHNIQUE: Multiplanar and multiecho pulse sequences of the lumbar spine were obtained without and with intravenous  contrast. CONTRAST:  86m GADAVIST GADOBUTROL 1 MMOL/ML IV SOLN COMPARISON:  Lumbar MRI 06/28/2021. CT-guided bone marrow biopsy 04/04/2022. FINDINGS: Segmentation: Lumbar segmentation appears to be normal and is the same numbering system used on the MRI last year. Alignment: Chronic dextroconvex lumbar scoliosis. Stable lumbar lordosis from last year. Vertebrae: Degenerative endplate changes but no suspicious or enhancing marrow lesion identified in the visible lower thoracic or lumbar spine. Visible sacrum bone marrow signal also appears to remain normal, although there is a partially visible lobulated and enhancing at least 5.5 cm presacral soft tissue mass abutting the ventral sacral cortex (series 14 image 10). Linear edema and enhancement of the right ilium from recent bone marrow aspiration (series 14, image 20). Conus medullaris and cauda equina: Conus extends to the L1 level. No lower spinal cord or conus signal abnormality. There is no visible lower thoracic spinal stenosis. No abnormal intradural enhancement or dural thickening identified. Paraspinal and other soft tissues: Negative visible paraspinal soft tissues outside of the presacral mass described above. Diverticulosis of large bowel in the pelvis. Disc levels: Advanced lumbar spine degeneration and multifactorial spinal, lateral recess, and foraminal stenosis at L2-L3 and L3-L4 has not significantly changed from last year, in part related to chronic left caudal disc extrusion at L2-L3 (series 11, image 17). Similar stable chronic right lateral recess stenosis and right foraminal stenosis at L4-L5. IMPRESSION: 1. No metastatic disease identified in the lower thoracic or lumbar spine. Chronic presacral soft tissue tumor, but the visible sacral bone marrow signal remains normal. 2. Normal visible lower thoracic spinal cord at T11 and T12. Normal conus at L1. 3. Advanced chronic lumbar spine degeneration not significantly changed from last year, with  chronic disc herniation contributing to spinal, lateral recess, and foraminal stenosis at L2-L3, L3-L4, and L4-L5. Electronically Signed   By: HGenevie AnnM.D.   On: 04/08/2022 12:18   ? ?Cardiac Studies  ? ?Echocardiogram 03/30/22 ? 1. Left ventricular ejection fraction, by estimation, is 45 to 50%. Left  ?ventricular ejection fraction by 2D MOD biplane is 48.0 %. The left  ?ventricle has mildly decreased function. The left ventricle demonstrates  ?global hypokinesis. Left ventricular  ?diastolic function could not be evaluated.  ? 2. Right ventricular systolic function is mildly reduced. The right  ?ventricular size is normal. There is mildly elevated pulmonary artery  ?systolic pressure. The estimated right ventricular systolic pressure is  ?396.0mmHg.  ? 3. The mitral valve is abnormal. Mild to moderate mitral valve  ?regurgitation.  ? 4. The aortic valve is tricuspid. Aortic valve regurgitation is not  ?  visualized. Aortic valve sclerosis is present, with no evidence of aortic  ?valve stenosis.  ? 5. The inferior vena cava is dilated in size with <50% respiratory  ?variability, suggesting right atrial pressure of 15 mmHg.  ? ?Comparison(s): Changes from prior study are noted. 10/27/2021: LVEF 60-65%.  ? ?Patient Profile  ?   ?76 y.o. female with a history of  paroxysmal atrial fibrillation not on anticoagulation, hypertension, hyperlipidemia, CKD stage III, COPD, CMML with associated pancytopenia, rheumatoid arthritis, and chronic back pain who was seen for evaluation of rapid atrial fibrillation. ? ?Assessment & Plan  ?  ?Paroxysmal Atrial Fibrillation  ?- Patient has been difficult to rate control this admission. Also does not have many treatment options as she has a prolonged QT and intermittently does not tolerate anticoagulation due to pancytopenia and now has concerns of bleeding with foot hematoma and some oral bleeding. ?- Heart rate actually has improved and is now in the 70 to low 100s on current medical  regimen. ?- We will consolidate Cardizem to CD 360 mg daily and transition metoprolol tartrate 150 mg twice daily to ? Toprol-XL 162m BID (due to DCM)  ?- Although cardizem is not ideal given reduced EF, patient

## 2022-04-10 NOTE — Progress Notes (Signed)
?PROGRESS NOTE ? ?[name]:[id number] DOB: 10/01/1946  ? ?PCP: [name], MD ? ?Patient is from: Home ? ?DOA: 03/29/2022 LOS: 12 ? ?Chief complaints ?Chief Complaint  ?Patient presents with  ? Shortness of Breath  ?  ? ?Brief Narrative / Interim history: ? ?76 y.o. female with medical history significant of  CMML, currently on chemotherapy, pancytopenia, rheumatoid arthritis on Remicade, paroxysmal A-fib (off of Eliquis for the last 2 days prior to presentation?  Secondary to bleeding complications versus pancytopenia), hypertension, COPD (follows up with pulmonary as an outpatient), bilateral pulmonary nodules presented with worsening shortness of breath.  On presentation, she was found to be in A-fib with RVR and was started on IV Cardizem and heparin drips. ? ?CTA chest was negative for PE but showed bilateral pleural effusion, more on the right with findings suggestive of multifocal pneumonia with numerous pleural-based right-sided masses, increased in size compared to previous CT. She was empirically started on IV antibiotics per oncology.  She was also started on IV steroid.  She underwent ultrasound-guided right-sided thoracentesis on 03/30/2022 with removal of 570 cc transudative fluid.  Fluid culture was not sent.  Cytology with reactive mesothelial cells.   ? ?Patient is stable on room air from respiratory standpoint.  However, she remains in A-fib with RVR although seems to be improving.  Cardiology following.  Heparin discontinued on 4/15 due to left dorsal foot hematoma and thrombocytopenia. ? ?Oncology following for CMML and pancytopenia  ? ?Subjective: ?Seen and examined earlier this morning.  No major events overnight or this morning.  No complaints.  She denies pain, shortness of breath, palpitation, dizziness, GI or UTI symptoms.  No pain in left foot but tender to touch. ? ?Objective: ?Vitals:  ? 04/10/22 0500 04/10/22 0807 04/10/22 0927 04/10/22 1350  ?BP: 117/74  (!) 113/59 102/60   ?Pulse:   79 61  ?Resp:    20  ?Temp:    97.6 ?F (36.4 ?C)  ?TempSrc:    Oral  ?SpO2:  95%  97%  ?Weight:      ?Height:      ? ? ?Examination: ? ?GENERAL: No apparent distress.  Nontoxic. ?HEENT: MMM.  Tongue and right lower gum hematoma seems to have resolved.  Vision and hearing grossly intact.  ?NECK: Supple.  No apparent JVD.  ?RESP:  No IWOB.  Fair aeration bilaterally. ?CVS: HR ranges from 70s to 90s.  Regular rhythm. Heart sounds normal.  ?ABD/GI/GU: BS+. Abd soft, NTND.  ?MSK/EXT:  Moves extremities. No apparent deformity. No edema.  Left foot hematoma improving ?SKIN: no apparent skin lesion or wound ?NEURO: Awake and alert. Oriented appropriately.  No apparent focal neuro deficit. ?PSYCH: Calm. Normal affect.  ? ?Procedures:  ?4/6-right thoracocentesis ? ?Microbiology summarized: ?COVID-19 and influenza PCR nonreactive. ?MRSA PCR nonreactive. ?Full RVP nonreactive. ? ?Assessment and Plan: ?* Paroxysmal atrial fibrillation with RVR ([place]) ?Remains in A-fib.  HR ranges 70s to 90s.  TTE with LVEF of 45 to 50%, GH, ?DD and RVSP of 38.  ?-Cardiology managing-consolidated Cardizem to Cardizem CD 360 mg daily ?-Continue Toprol XL 150 mg twice daily ?-Discontinued heparin on 4/15 due to left foot hematoma and worsening thrombocytopenia ?-Optimize electrolytes ? ?Dyspnea on exertion ?Multifactorial including COPD, pleural effusion, A-fib with RVR, cardiomyopathy, anemia, pneumonia and physical deconditioning. Denies cardiopulmonary symptoms at rest.  Improving. ?-Treat treatable causes.  ?-Completed antibiotic course for possible pneumonia and steroid course for COPD ?-S/p right thoracentesis with removal of 570 cc transudative fluid on  4/6.  Cytology-reactive mesothelial cells. ?-Reportedly did not tolerate Breztri, and has been on [employer] ?-Continue nebulizers, incentive spirometry, OOB, PT/OT ? ?Chronic systolic CHF/cardiomyopathy/DOE ?TTE as above.  CTA chest negative for PE but bilateral pleural effusion,  Rt>Lt. Intermittently diuresed with IV Lasix.  Good urine output.  Appears euvolemic. ?-Diuretics per cardiology-getting Lasix as needed. ?-Monitor fluid and respiratory status ?-Monitor renal functions and electrolytes. ? ?Hematoma of left foot ?Likely from heparin in the setting of thrombocytopenia.  Hematoma seems to be improving clinically. ?-Heparin on hold as of 4/15. ?-Tylenol as needed for pain ?-Monitor closely ? ?Malnutrition of moderate degree ?Body mass index is 21.72 kg/m?[place] Kitchen   ?Nutrition Problem: Moderate Malnutrition ?Etiology: chronic illness, cancer and cancer related treatments ?Signs/Symptoms: mild fat depletion, moderate muscle depletion, severe muscle depletion ?Interventions: Ensure Enlive (each supplement provides 350kcal and 20 grams of protein) ? ?Chronic kidney disease, stage 3a ([place]) ?Recent Labs  ?  04/01/22 ?0500 04/02/22 ?0610 04/03/22 ?0453 04/04/22 ?0443 04/05/22 ?0459 04/06/22 ?0500 04/07/22 ?7096 04/08/22 ?2836 04/09/22 ?6294 04/10/22 ?0448  ?BUN 43* 49* 51* 54* 50* 46* 40* 33* 29* 26*  ?CREATININE 1.10* 1.09* 1.11* 1.10* 1.04* 0.90 0.95 0.98 1.01* 0.99  ?Stable.  Continue monitoring ? ? ?Physical deconditioning ?PT/OT-recommended SNF. ? ?Pancytopenia ([place]) ?Likely from chemotherapy.  Leukopenia resolved.  Hemoglobin and platelet downtrending. ?-Heparin held on 4/15 due to left foot hematoma and worsening thrombocytopenia ?-Per heme-onc, transfuse for Hgb less than 7.5 and platelet less than 20 K or bleeding ?-Continue monitoring ? ?CMML (chronic myelomonocytic leukemia) ([place]) ?Since she is on Remicade and low-dose prednisone.  Per oncology, her bone marrow biopsy earlier this week showed persistent CMML, but the blast was only 2%, slightly lower than before.  No other evidence of disease progression on the marrow.  ?-Oncology following. ? ?Constipation ?Resolved.   ?-Continue bowel regimen ? ?Hypokalemia ?Resolved. ? ?Bilateral pleural effusion ?See dyspnea ? ?Multifocal  pneumonia ?Completed antibiotic course as above. ? ?Hx of multiple pulmonary nodules ?Increased in size on current CT.  She had bilateral pleural effusion.  However, pleural fluid cytology with reactive mesothelial cells. ?-May need repeat imaging in 4 to 6 weeks outpatient. ? ?Stage 2 moderate COPD by GOLD classification ([place]) ?See dyspnea ? ? ? ?DVT prophylaxis:  ?Place and maintain sequential compression device Start: 04/02/22 1610 ? ?Code Status: Full code ?Family Communication: Updated patient's husband at bedside ?Level of care: Telemetry ?Status is: Inpatient ?Remains inpatient appropriate because: A-fib with RVR and safe disposition ? ? ?Final disposition: SNF ?Consultants:  ?Cardiology ?Oncology ? ?Sch Meds:  ?Scheduled Meds: ? arformoterol  15 mcg Nebulization BID  ? And  ? umeclidinium bromide  1 puff Inhalation Daily  ? budesonide (PULMICORT) nebulizer solution  0.5 mg Nebulization Q12H  ? Chlorhexidine Gluconate Cloth  6 each Topical Daily  ? [START ON 04/11/2022] diltiazem  360 mg Oral Daily  ? diltiazem  90 mg Oral Q6H  ? lactose free nutrition  237 mL Oral TID WC  ? lidocaine  1 patch Transdermal Q24H  ? mouth rinse  15 mL Mouth Rinse BID  ? metoprolol succinate  150 mg Oral BID  ? mirtazapine  7.5 mg Oral QHS  ? multivitamin with minerals  1 tablet Oral Daily  ? senna-docusate  1 tablet Oral BID  ? sodium chloride flush  10-40 mL Intracatheter Q12H  ? ?Continuous Infusions: ? sodium chloride Stopped (04/01/22 1557)  ? ?PRN Meds:.sodium chloride, acetaminophen **OR** acetaminophen, albuterol, bisacodyl, dicyclomine, ondansetron **OR** ondansetron (ZOFRAN)  IV, polyethylene glycol, sodium chloride flush, sodium chloride flush, sodium phosphate, traZODone ? ?Antimicrobials: ?Anti-infectives (From admission, onward)  ? ? Start     Dose/Rate Route Frequency Ordered Stop  ? 03/30/22 1400  ceFEPIme (MAXIPIME) 2 g in sodium chloride 0.9 % 100 mL IVPB  Status:  Discontinued       ? 2 g ?200 mL/hr over 30  Minutes Intravenous Every 12 hours 03/30/22 1025 04/03/22 1131  ? 03/30/22 0800  ceFEPIme (MAXIPIME) 2 g in sodium chloride 0.9 % 100 mL IVPB  Status:  Discontinued       ? 2 g ?200 mL/hr over 30 Minutes Intravenous Every

## 2022-04-10 NOTE — TOC Progression Note (Signed)
Transition of Care (TOC) - Progression Note  ? ? ?Patient Details  ?Name: [name] ?MRN: [id number] ?Date of Birth: [birth year] ? ?Transition of Care (TOC) CM/SW Contact  ?Purcell Mouton, RN ?Phone Number: ?04/10/2022, 3:05 PM ? ?Clinical Narrative:    ? ?Spoke with pt's husband concerning bed SNF bed offers. Mr [name] asked for [facility] or [facility] for SNF. FL2 faxed to [facility].  ? ?  ?  ? ?Expected Discharge Plan and Services ?  ?  ?  ?  ?  ?                ?  ?  ?  ?  ?  ?  ?  ?  ?  ?  ? ? ?Social Determinants of Health (SDOH) Interventions ?  ? ?Readmission Risk Interventions ?   ? View : No data to display.  ?  ?  ?  ? ? ?

## 2022-04-10 NOTE — Progress Notes (Signed)
[name]   DOB:05-Feb-1946   TI#:[id number]   QQP#:[id number] ? ?Heme-onc follow-up note ? ?Subjective: [name] has been moved out of ICU, still complains profound fatigue and weakness, not able to walk. She also reports constipation and low abdominal cramps. Her nurse reports that she had good bowel movement this morning, but the patient does not remember.  She wants something for her abdominal cramps/pain.  ? ? ?Objective:  ?Vitals:  ? 04/10/22 0500 04/10/22 0807  ?BP: 117/74   ?Pulse:    ?Resp:    ?Temp:    ?SpO2:  95%  ?  Body mass index is 21.19 kg/m?. ? ?Intake/Output Summary (Last 24 hours) at 04/10/2022 0819 ?Last data filed at 04/10/2022 7124 ?Gross per 24 hour  ?Intake 240 ml  ?Output 250 ml  ?Net -10 ml  ? ? ? Sclerae unicteric ? Oropharynx clear ? No peripheral adenopathy ? Lungs slightly decreased breath sound on lung bases ? Heart regular rate and rhythm ? Abdomen benign ? MSK no focal spinal tenderness, no peripheral edema ? Neuro nonfocal ?  ? ?CBG (last 3)  ?No results for input(s): GLUCAP in the last 72 hours. ? ? ? ?Labs:  ? ?Urine Studies ?No results for input(s): UHGB, CRYS in the last 72 hours. ? ?Invalid input(s): UACOL, UAPR, USPG, UPH, UTP, UGL, UKET, UBIL, UNIT, UROB, ULEU, UEPI, UWBC, URBC, UBAC, CAST, UCOM, BILUA ? ?Basic Metabolic Panel: ?Recent Labs  ?Lab 04/06/22 ?0500 04/07/22 ?5809 04/08/22 ?9833 04/09/22 ?8250 04/10/22 ?0448  ?NA 137 139 138 136 137  ?K 4.0 3.7 3.7 4.1 4.2  ?CL 104 105 101 102 102  ?CO2 _0 ?GLUCOSE 111* 101* 94 140* 95  ?BUN 46* 40* 33* 29* 26*  ?CREATININE 0.90 0.95 0.98 1.01* 0.99  ?CALCIUM 8.7* 9.1 9.5 9.4 9.3  ?MG 2.4 2.4 2.1 1.9 1.9  ?PHOS 2.5 3.0 3.7 4.5 5.5*  ? ?GFR ?Estimated Creatinine Clearance: 42.4 mL/min (by C-G formula based on SCr of 0.99 mg/dL). ?Liver Function Tests: ?Recent Labs  ?Lab 04/06/22 ?0500 04/07/22 ?5397 04/08/22 ?6734 04/09/22 ?1937 04/10/22 ?0448  ?ALBUMIN 2.8* 2.9* 3.0* 3.0* 2.8*  ? ?No results for input(s): LIPASE,  AMYLASE in the last 168 hours. ?No results for input(s): AMMONIA in the last 168 hours. ?Coagulation profile ?No results for input(s): INR, PROTIME in the last 168 hours. ? ? ?CBC: ?Recent Labs  ?Lab 04/04/22 ?0443 04/05/22 ?0459 04/06/22 ?0500 04/07/22 ?9024 04/08/22 ?0973 04/09/22 ?5329 04/10/22 ?0448  ?WBC 2.1*   < > 3.4* 4.1 5.3 5.6 4.8  ?NEUTROABS 1.0*  --   --   --   --   --   --   ?HGB 9.3*   < > 9.1* 8.8* 9.0* 8.9* 7.8*  ?HCT 28.4*   < > 27.4* 26.1* 27.4* 27.6* 24.7*  ?MCV 102.2*   < > 101.1* 100.8* 101.1* 101.8* 102.9*  ?PLT 102*   < > 76* 70* 65* 63* 56*  ? < > = values in this interval not displayed.  ? ?Cardiac Enzymes: ?No results for input(s): CKTOTAL, CKMB, CKMBINDEX, TROPONINI in the last 168 hours. ?BNP: ?Invalid input(s): POCBNP ?CBG: ?No results for input(s): GLUCAP in the last 168 hours. ? ?D-Dimer ?No results for input(s): DDIMER in the last 72 hours. ? ?Hgb A1c ?No results for input(s): HGBA1C in the last 72 hours. ?Lipid Profile ?No results for input(s): CHOL, HDL, LDLCALC, TRIG, CHOLHDL, LDLDIRECT in the last 72 hours. ?Thyroid function studies ?No results for input(s):  TSH, T4TOTAL, T3FREE, THYROIDAB in the last 72 hours. ? ?Invalid input(s): FREET3 ? ?Anemia work up ?No results for input(s): VITAMINB12, FOLATE, FERRITIN, TIBC, IRON, RETICCTPCT in the last 72 hours. ?Microbiology ?Recent Results (from the past 240 hour(s))  ?Respiratory (~20 pathogens) panel by PCR     Status: None  ? Collection Time: 03/31/22  9:02 AM  ? Specimen: Nasopharyngeal Swab; Respiratory  ?Result Value Ref Range Status  ? Adenovirus NOT DETECTED NOT DETECTED Final  ? Coronavirus 229E NOT DETECTED NOT DETECTED Final  ?  Comment: (NOTE) ?The Coronavirus on the Respiratory Panel, DOES NOT test for the novel  ?Coronavirus (2019 nCoV) ?  ? Coronavirus HKU1 NOT DETECTED NOT DETECTED Final  ? Coronavirus NL63 NOT DETECTED NOT DETECTED Final  ? Coronavirus OC43 NOT DETECTED NOT DETECTED Final  ? Metapneumovirus NOT  DETECTED NOT DETECTED Final  ? Rhinovirus / Enterovirus NOT DETECTED NOT DETECTED Final  ? Influenza A NOT DETECTED NOT DETECTED Final  ? Influenza B NOT DETECTED NOT DETECTED Final  ? Parainfluenza Virus 1 NOT DETECTED NOT DETECTED Final  ? Parainfluenza Virus 2 NOT DETECTED NOT DETECTED Final  ? Parainfluenza Virus 3 NOT DETECTED NOT DETECTED Final  ? Parainfluenza Virus 4 NOT DETECTED NOT DETECTED Final  ? Respiratory Syncytial Virus NOT DETECTED NOT DETECTED Final  ? Bordetella pertussis NOT DETECTED NOT DETECTED Final  ? Bordetella Parapertussis NOT DETECTED NOT DETECTED Final  ? Chlamydophila pneumoniae NOT DETECTED NOT DETECTED Final  ? Mycoplasma pneumoniae NOT DETECTED NOT DETECTED Final  ?  Comment: Performed at [facility], [place] [address], [place], [place] [address]  ? ? ? ? ?Studies:  ?MR Lumbar Spine W Wo Contrast ? ?Result Date: 04/08/2022 ?CLINICAL DATA:  76 year old female with bilateral lower extremity weakness. Chronic leukemia. Presacral mass. Recent CT-guided right ilium bone marrow aspiration. EXAM: MRI LUMBAR SPINE WITHOUT AND WITH CONTRAST TECHNIQUE: Multiplanar and multiecho pulse sequences of the lumbar spine were obtained without and with intravenous contrast. CONTRAST:  47m GADAVIST GADOBUTROL 1 MMOL/ML IV SOLN COMPARISON:  Lumbar MRI 06/28/2021. CT-guided bone marrow biopsy 04/04/2022. FINDINGS: Segmentation: Lumbar segmentation appears to be normal and is the same numbering system used on the MRI last year. Alignment: Chronic dextroconvex lumbar scoliosis. Stable lumbar lordosis from last year. Vertebrae: Degenerative endplate changes but no suspicious or enhancing marrow lesion identified in the visible lower thoracic or lumbar spine. Visible sacrum bone marrow signal also appears to remain normal, although there is a partially visible lobulated and enhancing at least 5.5 cm presacral soft tissue mass abutting the ventral sacral cortex (series 14 image 10). Linear edema and  enhancement of the right ilium from recent bone marrow aspiration (series 14, image 20). Conus medullaris and cauda equina: Conus extends to the L1 level. No lower spinal cord or conus signal abnormality. There is no visible lower thoracic spinal stenosis. No abnormal intradural enhancement or dural thickening identified. Paraspinal and other soft tissues: Negative visible paraspinal soft tissues outside of the presacral mass described above. Diverticulosis of large bowel in the pelvis. Disc levels: Advanced lumbar spine degeneration and multifactorial spinal, lateral recess, and foraminal stenosis at L2-L3 and L3-L4 has not significantly changed from last year, in part related to chronic left caudal disc extrusion at L2-L3 (series 11, image 17). Similar stable chronic right lateral recess stenosis and right foraminal stenosis at L4-L5. IMPRESSION: 1. No metastatic disease identified in the lower thoracic or lumbar spine. Chronic presacral soft tissue tumor, but the visible sacral  bone marrow signal remains normal. 2. Normal visible lower thoracic spinal cord at T11 and T12. Normal conus at L1. 3. Advanced chronic lumbar spine degeneration not significantly changed from last year, with chronic disc herniation contributing to spinal, lateral recess, and foraminal stenosis at L2-L3, L3-L4, and L4-L5. Electronically Signed   By: Genevie Ann M.D.   On: 04/08/2022 12:18   ? ?Assessment: 76 y.o. female  ? ?1.  dyspnea secondary to pleural effusion, rapid A-fib, and deconditioning ?2.  CMML and Pancytopenia  ?3.  Pleural effusion, new, status post right thoracentesis, cytology (-) ?4.  COPD with possible exacerbation ?5.  Possible bacterial pneumonia ?6.  Hypertension ?7.  History of RA ?8.  Bilateral lower extremity edema and weakness  ?9. Left foot hematoma  ? ?Plan:  ?-I reviewed her lumbar MRI findings with patient and her husband this morning, it showed chronic presacral soft tissue tumor, which is stable, no other new  findings. ?-Patient is clinically stable, still complains of dyspnea on minimal exertion.  She developed hematoma on left foot secondary to anticoagulation, heparin drip has been held.  ?-Patient and her husband would l

## 2022-04-11 ENCOUNTER — Inpatient Hospital Stay: Payer: Medicare Other

## 2022-04-11 DIAGNOSIS — J90 Pleural effusion, not elsewhere classified: Secondary | ICD-10-CM | POA: Diagnosis not present

## 2022-04-11 DIAGNOSIS — I5022 Chronic systolic (congestive) heart failure: Secondary | ICD-10-CM | POA: Diagnosis not present

## 2022-04-11 DIAGNOSIS — N1831 Chronic kidney disease, stage 3a: Secondary | ICD-10-CM | POA: Diagnosis not present

## 2022-04-11 DIAGNOSIS — I480 Paroxysmal atrial fibrillation: Secondary | ICD-10-CM | POA: Diagnosis not present

## 2022-04-11 LAB — RENAL FUNCTION PANEL
Albumin: 2.8 g/dL — ABNORMAL LOW (ref 3.5–5.0)
Anion gap: 7 (ref 5–15)
BUN: 27 mg/dL — ABNORMAL HIGH (ref 8–23)
CO2: 28 mmol/L (ref 22–32)
Calcium: 9.6 mg/dL (ref 8.9–10.3)
Chloride: 101 mmol/L (ref 98–111)
Creatinine, Ser: 0.96 mg/dL (ref 0.44–1.00)
GFR, Estimated: >60 mL/min (ref >60)
Glucose, Bld: 104 mg/dL — ABNORMAL HIGH (ref 70–99)
Phosphorus: 5.8 mg/dL — ABNORMAL HIGH (ref 2.5–4.6)
Potassium: 4 mmol/L (ref 3.5–5.1)
Sodium: 136 mmol/L (ref 135–145)

## 2022-04-11 LAB — CBC
HCT: 23.7 % — ABNORMAL LOW (ref 36.0–46.0)
Hemoglobin: 7.8 g/dL — ABNORMAL LOW (ref 12.0–15.0)
MCH: 33.8 pg (ref 26.0–34.0)
MCHC: 32.9 g/dL (ref 30.0–36.0)
MCV: 102.6 fL — ABNORMAL HIGH (ref 80.0–100.0)
Platelets: 60 10*3/uL — ABNORMAL LOW (ref 150–400)
RBC: 2.31 MIL/uL — ABNORMAL LOW (ref 3.87–5.11)
RDW: 15.9 % — ABNORMAL HIGH (ref 11.5–15.5)
WBC: 6.6 10*3/uL (ref 4.0–10.5)
nRBC: 0 % (ref 0.0–0.2)

## 2022-04-11 LAB — HEMOGLOBIN AND HEMATOCRIT, BLOOD
HCT: 29.2 % — ABNORMAL LOW (ref 36.0–46.0)
Hemoglobin: 9.7 g/dL — ABNORMAL LOW (ref 12.0–15.0)

## 2022-04-11 LAB — PREPARE RBC (CROSSMATCH)

## 2022-04-11 LAB — MAGNESIUM: Magnesium: 1.8 mg/dL (ref 1.7–2.4)

## 2022-04-11 MED ORDER — DIGOXIN 125 MCG PO TABS
0.1250 mg | ORAL_TABLET | Freq: Every day | ORAL | Status: DC
  Administered 2022-04-12 – 2022-04-13 (×2): 0.125 mg via ORAL
  Filled 2022-04-11 (×2): qty 1

## 2022-04-11 MED ORDER — MAGNESIUM SULFATE 2 GM/50ML IV SOLN
2.0000 g | Freq: Once | INTRAVENOUS | Status: AC
  Administered 2022-04-11: 2 g via INTRAVENOUS
  Filled 2022-04-11: qty 50

## 2022-04-11 MED ORDER — DIGOXIN 0.25 MG/ML IJ SOLN
0.2500 mg | Freq: Once | INTRAMUSCULAR | Status: AC
  Administered 2022-04-11: 0.25 mg via INTRAVENOUS
  Filled 2022-04-11: qty 2

## 2022-04-11 MED ORDER — SODIUM CHLORIDE 0.9% IV SOLUTION: Freq: Once | INTRAVENOUS | Status: AC

## 2022-04-11 NOTE — TOC Progression Note (Signed)
Transition of Care (TOC) - Progression Note  ? ? ?Patient Details  ?Name: [name] ?MRN: [id number] ?Date of Birth: [birth year] ? ?Transition of Care (TOC) CM/SW Contact  ?Purcell Mouton, RN ?Phone Number: ?04/11/2022, 1:15 PM ? ?Clinical Narrative:    ? ?A call was made to several SNF's asking them to review pt again. [facility], [facility], [facility] and [facility]. ? ?  ?  ? ?Expected Discharge Plan and Services ?  ?  ?  ?  ?  ?                ?  ?  ?  ?  ?  ?  ?  ?  ?  ?  ? ? ?Social Determinants of Health (SDOH) Interventions ?  ? ?Readmission Risk Interventions ?   ? View : No data to display.  ?  ?  ?  ? ? ?

## 2022-04-11 NOTE — Progress Notes (Signed)
? ?Progress Note ? ?Patient Name: [name] ?Date of Encounter: 04/11/2022 ? ?[facility] Cardiologist: Mertie Moores, MD  ? ?Subjective  ? ?No complaints of CP, SOB or palpitations ? ?Inpatient Medications  ?  ?Scheduled Meds: ? sodium chloride   Intravenous Once  ? arformoterol  15 mcg Nebulization BID  ? And  ? umeclidinium bromide  1 puff Inhalation Daily  ? budesonide (PULMICORT) nebulizer solution  0.5 mg Nebulization Q12H  ? Chlorhexidine Gluconate Cloth  6 each Topical Daily  ? diltiazem  360 mg Oral Daily  ? lactose free nutrition  237 mL Oral TID WC  ? lidocaine  1 patch Transdermal Q24H  ? mouth rinse  15 mL Mouth Rinse BID  ? metoprolol succinate  150 mg Oral BID  ? mirtazapine  7.5 mg Oral QHS  ? multivitamin with minerals  1 tablet Oral Daily  ? senna-docusate  1 tablet Oral BID  ? ?Continuous Infusions: ? sodium chloride Stopped (04/01/22 1557)  ? ?PRN Meds: ?sodium chloride, acetaminophen **OR** acetaminophen, albuterol, bisacodyl, dicyclomine, ondansetron **OR** ondansetron (ZOFRAN) IV, polyethylene glycol, sodium chloride flush, sodium chloride flush, sodium phosphate, traZODone  ? ?Vital Signs  ?  ?Vitals:  ? 04/11/22 0500 04/11/22 0732 04/11/22 0739 04/11/22 0910  ?BP:      ?Pulse:    (!) 110  ?Resp: 19 (!) 29    ?Temp:      ?TempSrc:      ?SpO2:  94% 95%   ?Weight: 53.7 kg     ?Height:      ? ? ?Intake/Output Summary (Last 24 hours) at 04/11/2022 1019 ?Last data filed at 04/11/2022 0452 ?Gross per 24 hour  ?Intake --  ?Output 950 ml  ?Net -950 ml  ? ? ? ?  04/11/2022  ?  5:00 AM 04/10/2022  ?  4:12 AM 04/09/2022  ?  5:00 AM  ?Last 3 Weights  ?Weight (lbs) 118 lb 6.2 oz 123 lb 7.3 oz 119 lb 11.4 oz  ?Weight (kg) 53.7 kg 56 kg 54.3 kg  ?   ? ?Telemetry  ?  ?Atrial fibrillation with CVR but HR up in the 140's at times- Personally Reviewed ? ?ECG  ?  ?No new tracings since 4/11 - Personally Reviewed ? ?Physical Exam  ? ?GEN: Well nourished, well developed in no acute distress ?HEENT:  Normal ?NECK: No JVD; No carotid bruits ?LYMPHATICS: No lymphadenopathy ?CARDIAC:irregularly irregular, no murmurs, rubs, gallops ?RESPIRATORY:  Clear to auscultation without rales, wheezing or rhonchi  ?ABDOMEN: Soft, non-tender, non-distended ?MUSCULOSKELETAL:  No edema; No deformity  ?SKIN: Warm and dry ?NEUROLOGIC:  Alert and oriented x 3 ?PSYCHIATRIC:  Normal affect   ?Labs  ?  ?High Sensitivity Troponin:   ?Recent Labs  ?Lab 03/29/22 ?1403 03/29/22 ?1603  ?TROPONINIHS 20* 18*  ? ?   ?Chemistry ?Recent Labs  ?Lab 04/09/22 ?4332 04/10/22 ?0448 04/11/22 ?0350  ?NA 136 137 136  ?K 4.1 4.2 4.0  ?CL 102 102 101  ?CO2 28 28 28   ?GLUCOSE 140* 95 104*  ?BUN 29* 26* 27*  ?CREATININE 1.01* 0.99 0.96  ?CALCIUM 9.4 9.3 9.6  ?MG 1.9 1.9 1.8  ?ALBUMIN 3.0* 2.8* 2.8*  ?GFRNONAA 58* 59* >60  ?ANIONGAP 6 7 7   ? ?  ?Lipids No results for input(s): CHOL, TRIG, HDL, LABVLDL, LDLCALC, CHOLHDL in the last 168 hours.  ?Hematology ?Recent Labs  ?Lab 04/10/22 ?0448 04/10/22 ?1014 04/11/22 ?0350  ?WBC 4.8 5.7 6.6  ?RBC 2.40* 2.30* 2.31*  ?HGB 7.8*  7.7* 7.8*  ?HCT 24.7* 23.7* 23.7*  ?MCV 102.9* 103.0* 102.6*  ?MCH 32.5 33.5 33.8  ?MCHC 31.6 32.5 32.9  ?RDW 15.9* 16.0* 15.9*  ?PLT 56* 55* 60*  ? ? ?Thyroid No results for input(s): TSH, FREET4 in the last 168 hours.  ?BNPNo results for input(s): BNP, PROBNP in the last 168 hours.  ?DDimer No results for input(s): DDIMER in the last 168 hours.  ? ?Radiology  ?  ?No results found. ? ?Cardiac Studies  ? ?Echocardiogram 03/30/22 ? 1. Left ventricular ejection fraction, by estimation, is 45 to 50%. Left  ?ventricular ejection fraction by 2D MOD biplane is 48.0 %. The left  ?ventricle has mildly decreased function. The left ventricle demonstrates  ?global hypokinesis. Left ventricular  ?diastolic function could not be evaluated.  ? 2. Right ventricular systolic function is mildly reduced. The right  ?ventricular size is normal. There is mildly elevated pulmonary artery  ?systolic pressure. The  estimated right ventricular systolic pressure is  ?47.6 mmHg.  ? 3. The mitral valve is abnormal. Mild to moderate mitral valve  ?regurgitation.  ? 4. The aortic valve is tricuspid. Aortic valve regurgitation is not  ?visualized. Aortic valve sclerosis is present, with no evidence of aortic  ?valve stenosis.  ? 5. The inferior vena cava is dilated in size with <50% respiratory  ?variability, suggesting right atrial pressure of 15 mmHg.  ? ?Comparison(s): Changes from prior study are noted. 10/27/2021: LVEF 60-65%.  ? ?Patient Profile  ?   ?76 y.o. female with a history of  paroxysmal atrial fibrillation not on anticoagulation, hypertension, hyperlipidemia, CKD stage III, COPD, CMML with associated pancytopenia, rheumatoid arthritis, and chronic back pain who was seen for evaluation of rapid atrial fibrillation. ? ?Assessment & Plan  ?  ?Paroxysmal Atrial Fibrillation  ?- Patient has been difficult to rate control this admission. Also does not have many treatment options as she has a prolonged QT and intermittently does not tolerate anticoagulation due to pancytopenia and now has concerns of bleeding with foot hematoma and some oral bleeding. ?- Heart rate in low 100's but at times goes up into the 140's ?- continue Cardizem CD 334m daily ?-Cardizem to CD 360 mg daily and Toprol XL 1553mBID ?-BP too soft for uptitration of BB further ?-cannot use Amio due to prolonged QTc and risk of conversion to NSR off anticoagulation ?-will add digoxin 0.2555mV now and then 0.125m107mily starting tomorrow ?-Although cardizem is not ideal given reduced EF, patient has very limited treatment options and cardizem+metoprolol are controlling her heart rate reasonably well. ?-off heparin for concern of bleeding and therefore not a candidate for TEE/DCCV ?-dizziness may be related to afib when in RVR. ?-Timing to restart anti-coagulation to be determined by oncology as platelet count still remains low ? ?Dyspnea on  exertion ?Cardiomyopathy ?Hypertension ?- LVEF 40-45% this admission  ?- CTA chest negative for PE but showed moderate right and trace left pleural effusions with signs suspicious for multifocal PNA ?- ABX, solumedrol, and nebs per RT and primary ?- GDMT includes toprol - losartan on hold for now due to soft BP ?- cardizem generally contraindicated with CM, but her rate control options are limited.  ?- has had intermittent diuresis but does not currently appear volume overloaded ?- will likely need access to PRN lasix for weight gain, maintain low sodium diet ? ?CMML ?Pancytopenia ?- Hb stable at 7.8 ?- Oncology following  ?  ?Deconditioned ?- unclear what her functional status is currently ?-  continue working with PT and evaluate for ongoing dizziness with ambulation ? ?   ? ?For questions or updates, please contact [facility] ?Please consult [email or web address] for contact info under  ? ?  ?   ?Signed, ?Fransico Him, MD  ?04/11/2022, 10:19 AM   ? ? ? ?  ?

## 2022-04-11 NOTE — Progress Notes (Signed)
?Physical Therapy Treatment ?Patient Details ?Name: [name] ?MRN: [id number] ?DOB: 04/20/1946 ?Today's Date: 04/11/2022 ? ? ?History of Present Illness 76 y.o. female with medical history significant of  CMML, currently on chemotherapy, pancytopenia, rheumatoid arthritis on Remicade, paroxysmal A-fib (off of Eliquis for the last 2 days prior to presentation?  Secondary to bleeding complications versus pancytopenia), hypertension, COPD (follows up with pulmonary as an outpatient), bilateral pulmonary nodules presented with worsening shortness of breath. Found to be in A-fib with RVR.. CTA chest was negative for pulmonary embolism but showed bilateral pleural effusion, more on the right side with findings suggestive of multifocal pneumonia with numerous pleural-based right-sided masses ? ?  ?PT Comments  ? ? The patient demonstrates improved mobility and endurance. Patient ambulated x 32' then 84' with  RW and a seated rest break. HR 104-151 not sustained  in high numbers.  Patient reports her arms tire from ambulation, while pushing on RW.  ?Continue progressive activity.   ?Recommendations for follow up therapy are one component of a multi-disciplinary discharge planning process, led by the attending physician.  Recommendations may be updated based on patient status, additional functional criteria and insurance authorization. ? ?Follow Up Recommendations ? Skilled nursing-short term rehab (<3 hours/day) ?  ?  ?Assistance Recommended at Discharge Intermittent Supervision/Assistance  ?Patient can return home with the following A little help with walking and/or transfers;A little help with bathing/dressing/bathroom;Assistance with cooking/housework;Assist for transportation;Help with stairs or ramp for entrance ?  ?Equipment Recommendations ? None recommended by PT  ?  ?Recommendations for Other Services   ? ? ?  ?Precautions / Restrictions Precautions ?Precaution Comments: monitor HR and sat, afib,has a  hematoma L dorsum foot  ?  ? ?Mobility ? Bed Mobility ?  ?  ?  ?  ?  ?  ?  ?General bed mobility comments: in recliner ?  ? ?Transfers ?  ?Equipment used: Rolling walker (2 wheels) ?  ?Sit to Stand: Min assist ?  ?  ?  ?  ?  ?General transfer comment: min assist to power up fro recliner with pillows in seat. ?  ? ?Ambulation/Gait ?Ambulation/Gait assistance: Min guard, Min assist ?Gait Distance (Feet): 32 Feet (then 82') ?Assistive device: Rolling walker (2 wheels) ?Gait Pattern/deviations: Step-through pattern ?Gait velocity: decr ?  ?  ?General Gait Details: much imprived gait  and use of Rw ? ? ?Stairs ?  ?  ?  ?  ?  ? ? ?Wheelchair Mobility ?  ? ?Modified Rankin (Stroke Patients Only) ?  ? ? ?  ?Balance   ?Sitting-balance support: Feet supported ?Sitting balance-Leahy Scale: Fair ?  ?  ?Standing balance support: During functional activity, Bilateral upper extremity supported, Reliant on assistive device for balance ?Standing balance-Leahy Scale: Fair ?  ?  ?  ?  ?  ?  ?  ?  ?  ?  ?  ?  ?  ? ?  ?Cognition Arousal/Alertness: Awake/alert ?Behavior During Therapy: [facility] for tasks assessed/performed ?Overall Cognitive Status: Within Functional Limits for tasks assessed ?  ?  ?  ?  ?  ?  ?  ?  ?  ?  ?  ?  ?  ?  ?  ?  ?General Comments: cheerful and talkative today ?  ?  ? ?  ?Exercises   ? ?  ?General Comments   ?  ?  ? ?Pertinent Vitals/Pain Pain Assessment ?Faces Pain Scale: Hurts a little bit ?Pain Location: arms when pushing RW and "bottom  from sitting"  ? ? ?Home Living   ?  ?  ?  ?  ?  ?  ?  ?  ?  ?   ?  ?Prior Function    ?  ?  ?   ? ?PT Goals (current goals can now be found in the care plan section) Progress towards PT goals: Progressing toward goals ? ?  ?Frequency ? ? ? Min 2X/week ? ? ? ?  ?PT Plan Current plan remains appropriate;Frequency needs to be updated  ? ? ?Co-evaluation PT/OT/SLP Co-Evaluation/Treatment: Yes ?Reason for Co-Treatment: For patient/therapist safety ?PT goals addressed during session:  Mobility/safety with mobility ?  ?  ? ?  ?AM-PAC PT "6 Clicks" Mobility   ?Outcome Measure ? Help needed turning from your back to your side while in a flat bed without using bedrails?: A Little ?Help needed moving from lying on your back to sitting on the side of a flat bed without using bedrails?: A Little ?Help needed moving to and from a bed to a chair (including a wheelchair)?: A Little ?Help needed standing up from a chair using your arms (e.g., wheelchair or bedside chair)?: A Little ?Help needed to walk in hospital room?: A Little ?Help needed climbing 3-5 steps with a railing? : A Lot ?6 Click Score: 17 ? ?  ?End of Session Equipment Utilized During Treatment: Gait belt ?Activity Tolerance: Patient tolerated treatment well ?Patient left: in chair;with call bell/phone within reach (w/OT) ?Nurse Communication: Mobility status ?PT Visit Diagnosis: Difficulty in walking, not elsewhere classified (R26.2);Muscle weakness (generalized) (M62.81);Unsteadiness on feet (R26.81) ?  ? ? ?Time: 9842-1031 ?PT Time Calculation (min) (ACUTE ONLY): 24 min ? ?Charges:  $Gait Training: 8-22 mins          ?          ? ?Tresa Endo PT ?Acute Rehabilitation Services ?Pager [phone] ?Office [phone] ? ? ? ?[name], [name] ?04/11/2022, 11:42 AM ? ?

## 2022-04-11 NOTE — Progress Notes (Signed)
Occupational Therapy Treatment ?Patient Details ?Name: [name] ?MRN: [id number] ?DOB: Mar 30, 1946 ?Today's Date: 04/11/2022 ? ? ?History of present illness 76 y.o. female with medical history significant of  CMML, currently on chemotherapy, pancytopenia, rheumatoid arthritis on Remicade, paroxysmal A-fib (off of Eliquis for the last 2 days prior to presentation?  Secondary to bleeding complications versus pancytopenia), hypertension, COPD (follows up with pulmonary as an outpatient), bilateral pulmonary nodules presented with worsening shortness of breath. Found to be in A-fib with RVR.. CTA chest was negative for pulmonary embolism but showed bilateral pleural effusion, more on the right side with findings suggestive of multifocal pneumonia with numerous pleural-based right-sided masses ?  ?OT comments ? Pt demonstrating significant improvement from previous sessions, only requiring assist with chair follow during hallway mobility focusing on improved activity tolerance for ADL and other meaningful occupations. SpO2 WFL and HR fluctuated from 104-151 but did not sustain at high number for substantial amount of time. Pt's arms fatigued, at end of session provided green theraband, and demonstrated 2 different exercises with band, as well as chair push ups for Pt to perform to work on strengthening and to assist with functional transfers. OT will continue to follow acutely and POC remains appropriate at this time.   ? ?Recommendations for follow up therapy are one component of a multi-disciplinary discharge planning process, led by the attending physician.  Recommendations may be updated based on patient status, additional functional criteria and insurance authorization. ?   ?Follow Up Recommendations ? Skilled nursing-short term rehab (<3 hours/day)  ?  ?Assistance Recommended at Discharge Frequent or constant Supervision/Assistance  ?Patient can return home with the following ? A little help with walking  and/or transfers;A little help with bathing/dressing/bathroom;Assistance with cooking/housework;Help with stairs or ramp for entrance ?  ?Equipment Recommendations ?    ?  ?Recommendations for Other Services   ? ?  ?Precautions / Restrictions Precautions ?Precautions: Other (comment);Fall ?Precaution Comments: monitor HR and sat, afib,has a hematoma L dorsum foot ?Restrictions ?Weight Bearing Restrictions: No  ? ? ?  ? ?Mobility Bed Mobility ?  ?  ?  ?  ?  ?  ?  ?General bed mobility comments: in recliner ?  ? ?Transfers ?Overall transfer level: Needs assistance ?Equipment used: Rolling walker (2 wheels) ?Transfers: Sit to/from Stand ?Sit to Stand: Min assist ?  ?  ?  ?  ?  ?General transfer comment: min assist to power up fro recliner with pillows in seat. ?  ?  ?Balance Overall balance assessment: Needs assistance ?Sitting-balance support: Feet supported ?Sitting balance-Leahy Scale: Fair ?  ?  ?Standing balance support: During functional activity, Bilateral upper extremity supported, Reliant on assistive device for balance ?Standing balance-Leahy Scale: Fair ?  ?  ?  ?  ?  ?  ?  ?  ?  ?  ?  ?  ?   ? ?ADL either performed or assessed with clinical judgement  ? ?ADL Overall ADL's : Needs assistance/impaired ?  ?  ?  ?  ?  ?  ?  ?  ?  ?  ?  ?  ?Toilet Transfer: Minimal assistance;Ambulation;Rolling walker (2 wheels) ?Toilet Transfer Details (indicate cue type and reason): no buckling this session ?Toileting- Clothing Manipulation and Hygiene: Minimal assistance;Sitting/lateral lean ?Toileting - Clothing Manipulation Details (indicate cue type and reason): Pt able to perform her own peri care with min A for standing balance ?  ?  ?Functional mobility during ADLs: Minimal assistance;Rolling walker (2 wheels) ?General ADL  Comments: Hr fluctuated throughout session 154 highest witnessed, comes down quickly ?  ? ?Extremity/Trunk Assessment Upper Extremity Assessment ?Upper Extremity Assessment: Generalized weakness ?  ?   ?  ?  ?  ? ?Vision   ?  ?  ?Perception   ?  ?Praxis   ?  ? ?Cognition Arousal/Alertness: Awake/alert ?Behavior During Therapy: [facility] for tasks assessed/performed ?Overall Cognitive Status: Within Functional Limits for tasks assessed ?  ?  ?  ?  ?  ?  ?  ?  ?  ?  ?  ?  ?  ?  ?  ?  ?General Comments: cheerful and talkative today ?  ?  ?   ?Exercises Exercises: General Upper Extremity ?General Exercises - Upper Extremity ?Shoulder Flexion: AROM, Strengthening, Both, 5 reps, Seated, Theraband ?Theraband Level (Shoulder Flexion): Level 3 (Green) ?Shoulder Horizontal ABduction: AROM, Strengthening, Both, 5 reps, Seated, Theraband ?Theraband Level (Shoulder Horizontal Abduction): Level 3 (Green) ?Chair Push Up: AROM, Strengthening, Both, 5 reps ? ?  ?Shoulder Instructions   ? ? ?  ?General Comments    ? ? ?Pertinent Vitals/ Pain       Pain Assessment ?Pain Assessment: Faces ?Faces Pain Scale: Hurts a little bit ?Pain Location: arms when pushing RW and "bottom from sitting" ?Pain Descriptors / Indicators: Discomfort ?Pain Intervention(s): Monitored during session, Repositioned ? ?Home Living   ?  ?  ?  ?  ?  ?  ?  ?  ?  ?  ?  ?  ?  ?  ?  ?  ?  ?  ? ?  ?Prior Functioning/Environment    ?  ?  ?  ?   ? ?Frequency ? Min 2X/week  ? ? ? ? ?  ?Progress Toward Goals ? ?OT Goals(current goals can now be found in the care plan section) ? Progress towards OT goals: Progressing toward goals ? ?Acute Rehab OT Goals ?Patient Stated Goal: eventually visit [place] again ?OT Goal Formulation: With patient ?Time For Goal Achievement: 04/16/22 ?Potential to Achieve Goals: Good  ?Plan Discharge plan remains appropriate   ? ?Co-evaluation ? ? ? PT/OT/SLP Co-Evaluation/Treatment: Yes ?Reason for Co-Treatment: For patient/therapist safety;To address functional/ADL transfers ?PT goals addressed during session: Mobility/safety with mobility;Balance;Proper use of DME;Strengthening/ROM ?OT goals addressed during session: ADL's and self-care;Proper  use of Adaptive equipment and DME;Strengthening/ROM ?  ? ?  ?AM-PAC OT "6 Clicks" Daily Activity     ?Outcome Measure ? ? Help from another person eating meals?: None ?Help from another person taking care of personal grooming?: A Little ?Help from another person toileting, which includes using toliet, bedpan, or urinal?: A Lot ?Help from another person bathing (including washing, rinsing, drying)?: A Little ?Help from another person to put on and taking off regular upper body clothing?: A Little ?Help from another person to put on and taking off regular lower body clothing?: A Lot ?6 Click Score: 17 ? ?  ?End of Session Equipment Utilized During Treatment: Gait belt;Rolling walker (2 wheels) ? ?OT Visit Diagnosis: Muscle weakness (generalized) (M62.81) ?  ?Activity Tolerance Patient tolerated treatment well ?  ?Patient Left in chair;with call bell/phone within reach;with chair alarm set ?  ?Nurse Communication Mobility status ?  ? ?   ? ?Time: 3151-7616 ?OT Time Calculation (min): 27 min ? ?Charges: OT General Charges ?$OT Visit: 1 Visit ?OT Treatments ?$Therapeutic Activity: 8-22 mins ? ?Jesse Sans OTR/L ?Acute Rehabilitation Services ?Pager: [phone] ?Office: [phone] ? ?Merri Ray Javarion Douty ?04/11/2022, 12:42 PM ?

## 2022-04-11 NOTE — Progress Notes (Signed)
?PROGRESS NOTE ? ?ANALAYA HOEY KKX:[id number] DOB: Sep 14, 1946  ? ?PCP: [name], MD ? ?Patient is from: Home ? ?DOA: 03/29/2022 LOS: 13 ? ?Chief complaints ?Chief Complaint  ?Patient presents with  ? Shortness of Breath  ?  ? ?Brief Narrative / Interim history: ? ?76 y.o. female with medical history significant of  CMML POA, pancytopenia, RA on Remicade, paroxysmal A-fib off Eliquis due to pancytopenia, HTN, COPD, bilateral pulmonary nodules presented with worsening shortness of breath.  On presentation, she was found to be in A-fib with RVR and was started on IV Cardizem and heparin drips.  ? ?CTA chest was negative for PE but showed bilateral pleural effusion, more on the right with findings suggestive of multifocal pneumonia with numerous pleural-based right-sided masses, increased in size compared to previous CT. She was empirically started on IV antibiotics per oncology.  She was also started on IV steroid.  She underwent US-guided right-sided thoracentesis on 03/30/2022 with removal of 570 cc transudative fluid.  Fluid culture was not sent.  Cytology with reactive mesothelial cells.  Patient completed course of antibiotics and steroid.  Respiratory symptoms resolved except for DOE. ? ?Patient remains in A-fib with RVR although seems to be improving.  Cardiology following.  Heparin discontinued on 4/15 due to left dorsal foot hematoma and thrombocytopenia. ? ?Oncology following for CMML and pancytopenia.  Therapy recommended SNF.  ? ?Subjective: ?Seen and examined earlier this morning.  No major events overnight or this morning.  She denies pain, dyspnea, chest pain, palpitation and dizziness.  She reports abdominal cramp when she has not a bowel movement.  ? ?Objective: ?Vitals:  ? 04/11/22 0739 04/11/22 0910 04/11/22 1058 04/11/22 1326  ?BP:   102/80 105/70  ?Pulse:  (!) 110 (!) 109 75  ?Resp:   20 20  ?Temp:   97.9 ?F (36.6 ?C)   ?TempSrc:   Oral   ?SpO2: 95%   97%  ?Weight:      ?Height:       ? ? ?Examination: ? ?GENERAL: No apparent distress.  Nontoxic. ?HEENT: MMM.  Vision and hearing grossly intact.  ?NECK: Supple.  No apparent JVD.  ?RESP: On RA.  No IWOB.  Fair aeration bilaterally. ?CVS: Irregular rhythm.  HR ranges from 70s to 130s.  Heart sounds normal.  ?ABD/GI/GU: BS+. Abd soft, NTND.  ?MSK/EXT:  Moves extremities. No apparent deformity. No edema.  Left foot hematoma improving. ?SKIN: no apparent skin lesion or wound ?NEURO: Awake and alert. Oriented appropriately.  No apparent focal neuro deficit. ?PSYCH: Calm. Normal affect.  ? ?Procedures:  ?4/6-right thoracocentesis ? ?Microbiology summarized: ?COVID-19 and influenza PCR nonreactive. ?MRSA PCR nonreactive. ?Full RVP nonreactive. ? ?Assessment and Plan: ?* Paroxysmal atrial fibrillation with RVR ([place]) ?TTE with LVEF of 45 to 50%, GH, ?DD and RVSP of 38.  TSH within normal.  Remains in A-fib.  HR mostly in 70s to 110s but goes as high as 130s. ?-Cardiology managing- ?-Cardizem CD 360 mg daily and Toprol-XL 150 mg daily ?-Cardiology started digoxin. ?-Discontinued heparin on 4/15 due to left foot hematoma and worsening thrombocytopenia ?-Optimize electrolytes ? ?Dyspnea on exertion ?Multifactorial including COPD, pleural effusion, A-fib with RVR, cardiomyopathy, anemia, pneumonia and physical deconditioning. Denies cardiopulmonary symptoms at rest.  Improving. ?-Treat treatable causes.  ?-Completed antibiotic course for possible pneumonia and steroid course for COPD ?-S/p right thoracentesis with removal of 570 cc transudative fluid on 4/6.  Cytology-reactive mesothelial cells. ?-Reportedly did not tolerate Breztri, and has been on Stiolto ?-Continue nebulizers,  incentive spirometry, OOB, PT/OT ? ?Chronic systolic CHF/cardiomyopathy/DOE ?TTE as above.  CTA chest negative for PE but bilateral pleural effusion, Rt>Lt. Intermittently diuresed with IV Lasix.  Good urine output.  Appears euvolemic. ?-Diuretics per cardiology-getting Lasix as  needed. ?-Monitor fluid and respiratory status ?-Monitor renal functions and electrolytes. ? ?Hematoma of left foot ?Likely from heparin in the setting of thrombocytopenia.  Hematoma seems to be improving clinically. ?-Heparin on hold as of 4/15. ?-Tylenol as needed for pain ?-Lidoderm patch ?-Monitor closely ? ?Malnutrition of moderate degree ?Body mass index is 21.72 kg/m?Marland Kitchen   ?Nutrition Problem: Moderate Malnutrition ?Etiology: chronic illness, cancer and cancer related treatments ?Signs/Symptoms: mild fat depletion, moderate muscle depletion, severe muscle depletion ?Interventions: Ensure Enlive (each supplement provides 350kcal and 20 grams of protein) ? ?Chronic kidney disease, stage 3a ([place]) ?Recent Labs  ?  04/02/22 ?0610 04/03/22 ?0453 04/04/22 ?0443 04/05/22 ?0459 04/06/22 ?0500 04/07/22 ?2633 04/08/22 ?3545 04/09/22 ?6256 04/10/22 ?0448 04/11/22 ?0350  ?BUN 49* 51* 54* 50* 46* 40* 33* 29* 26* 27*  ?CREATININE 1.09* 1.11* 1.10* 1.04* 0.90 0.95 0.98 1.01* 0.99 0.96  ?Stable.  Continue monitoring ? ? ?Physical deconditioning ?Treat treatable causes. ?PT/OT-recommended SNF. ? ?Pancytopenia ([place]) ?Likely from chemotherapy.  Leukopenia resolved.  Hemoglobin and platelet downtrending. ?-Heparin held on 4/15 due to left foot hematoma and worsening thrombocytopenia ?-Per heme-onc, transfuse for Hgb less than 7.5 and platelet less than 20 K or bleeding ?-Hgb slowly drifting down.  7.8 this morning.  Given fatigue and DOE, will transfuse 1 unit ?-Continue monitoring ? ?CMML (chronic myelomonocytic leukemia) ([place]) ?Since she is on Remicade and low-dose prednisone.  Per oncology, her bone marrow biopsy earlier this week showed persistent CMML, but the blast was only 2%, slightly lower than before.  No other evidence of disease progression on the marrow.  ?-Oncology following-chemotherapy on hold. ? ?Constipation ?Resolved.   ?-Continue bowel regimen ? ?Hypokalemia ?Resolved. ? ?Bilateral pleural effusion ?See  dyspnea ? ?Multifocal pneumonia ?Completed antibiotic course as above. ? ?Hx of multiple pulmonary nodules ?Increased in size on current CT.  She had bilateral pleural effusion.  However, pleural fluid cytology with reactive mesothelial cells. ?-May need repeat imaging in 4 to 6 weeks outpatient. ? ?Stage 2 moderate COPD by GOLD classification ([place]) ?See dyspnea ? ? ? ?DVT prophylaxis:  ?Place and maintain sequential compression device Start: 04/02/22 1610 ? ?Code Status: Full code ?Family Communication: Updated patient's husband at bedside ?Level of care: Telemetry ?Status is: Inpatient ?Remains inpatient appropriate because: A-fib with RVR and safe disposition ? ? ?Final disposition: SNF ?Consultants:  ?Cardiology ?Oncology ? ?Sch Meds:  ?Scheduled Meds: ? sodium chloride   Intravenous Once  ? arformoterol  15 mcg Nebulization BID  ? And  ? umeclidinium bromide  1 puff Inhalation Daily  ? budesonide (PULMICORT) nebulizer solution  0.5 mg Nebulization Q12H  ? Chlorhexidine Gluconate Cloth  6 each Topical Daily  ? [START ON 04/12/2022] digoxin  0.125 mg Oral Daily  ? diltiazem  360 mg Oral Daily  ? lactose free nutrition  237 mL Oral TID WC  ? lidocaine  1 patch Transdermal Q24H  ? mouth rinse  15 mL Mouth Rinse BID  ? metoprolol succinate  150 mg Oral BID  ? mirtazapine  7.5 mg Oral QHS  ? multivitamin with minerals  1 tablet Oral Daily  ? senna-docusate  1 tablet Oral BID  ? ?Continuous Infusions: ? sodium chloride Stopped (04/01/22 1557)  ? ?PRN Meds:.sodium chloride, acetaminophen **OR** acetaminophen, albuterol, bisacodyl,  dicyclomine, ondansetron **OR** ondansetron (ZOFRAN) IV, polyethylene glycol, sodium chloride flush, sodium chloride flush, sodium phosphate, traZODone ? ?Antimicrobials: ?Anti-infectives (From admission, onward)  ? ? Start     Dose/Rate Route Frequency Ordered Stop  ? 03/30/22 1400  ceFEPIme (MAXIPIME) 2 g in sodium chloride 0.9 % 100 mL IVPB  Status:  Discontinued       ? 2 g ?200 mL/hr over  30 Minutes Intravenous Every 12 hours 03/30/22 1025 04/03/22 1131  ? 03/30/22 0800  ceFEPIme (MAXIPIME) 2 g in sodium chloride 0.9 % 100 mL IVPB  Status:  Discontinued       ? 2 g ?200 mL/hr over 30 Minutes Intravenous Ev

## 2022-04-12 ENCOUNTER — Inpatient Hospital Stay: Payer: Medicare Other

## 2022-04-12 DIAGNOSIS — R0609 Other forms of dyspnea: Secondary | ICD-10-CM

## 2022-04-12 DIAGNOSIS — I480 Paroxysmal atrial fibrillation: Secondary | ICD-10-CM | POA: Diagnosis not present

## 2022-04-12 DIAGNOSIS — J810 Acute pulmonary edema: Secondary | ICD-10-CM | POA: Diagnosis not present

## 2022-04-12 DIAGNOSIS — N1831 Chronic kidney disease, stage 3a: Secondary | ICD-10-CM | POA: Diagnosis not present

## 2022-04-12 DIAGNOSIS — I4891 Unspecified atrial fibrillation: Secondary | ICD-10-CM | POA: Diagnosis not present

## 2022-04-12 LAB — TYPE AND SCREEN
ABO/RH(D): B POS
Antibody Screen: NEGATIVE
Unit division: 0

## 2022-04-12 LAB — BPAM RBC
Blood Product Expiration Date: 202305032359
ISSUE DATE / TIME: 202304181424
Unit Type and Rh: 7300

## 2022-04-12 MED ORDER — METOPROLOL SUCCINATE ER 25 MG PO TB24
25.0000 mg | ORAL_TABLET | Freq: Once | ORAL | Status: AC
  Administered 2022-04-12: 25 mg via ORAL
  Filled 2022-04-12: qty 1

## 2022-04-12 MED ORDER — METOPROLOL SUCCINATE ER 50 MG PO TB24
175.0000 mg | ORAL_TABLET | Freq: Two times a day (BID) | ORAL | Status: DC
  Administered 2022-04-12 – 2022-04-13 (×2): 175 mg via ORAL
  Filled 2022-04-12 (×2): qty 1

## 2022-04-12 NOTE — Plan of Care (Signed)
?  Problem: Activity: ?Goal: Ability to tolerate increased activity will improve ?Outcome: Progressing ?  ?Problem: Respiratory: ?Goal: Levels of oxygenation will improve ?Outcome: Progressing ?  ?

## 2022-04-12 NOTE — Progress Notes (Signed)
? ?Progress Note ? ?Patient Name: [name] ?Date of Encounter: 04/12/2022 ? ?[facility] Cardiologist: Mertie Moores, MD  ? ?Subjective  ? ?Denies any chest pain shortness of breath or palpitations. ? ?Inpatient Medications  ?  ?Scheduled Meds: ? arformoterol  15 mcg Nebulization BID  ? And  ? umeclidinium bromide  1 puff Inhalation Daily  ? budesonide (PULMICORT) nebulizer solution  0.5 mg Nebulization Q12H  ? Chlorhexidine Gluconate Cloth  6 each Topical Daily  ? digoxin  0.125 mg Oral Daily  ? diltiazem  360 mg Oral Daily  ? lactose free nutrition  237 mL Oral TID WC  ? lidocaine  1 patch Transdermal Q24H  ? mouth rinse  15 mL Mouth Rinse BID  ? metoprolol succinate  150 mg Oral BID  ? mirtazapine  7.5 mg Oral QHS  ? multivitamin with minerals  1 tablet Oral Daily  ? senna-docusate  1 tablet Oral BID  ? ?Continuous Infusions: ? sodium chloride Stopped (04/01/22 1557)  ? ?PRN Meds: ?sodium chloride, acetaminophen **OR** acetaminophen, albuterol, bisacodyl, dicyclomine, ondansetron **OR** ondansetron (ZOFRAN) IV, polyethylene glycol, sodium chloride flush, sodium chloride flush, sodium phosphate, traZODone  ? ?Vital Signs  ?  ?Vitals:  ? 04/12/22 0540 04/12/22 0738 04/12/22 0852 04/12/22 1024  ?BP: 119/69  (!) 101/48 119/81  ?Pulse: 97  (!) 41 91  ?Resp: 18  17   ?Temp: 98.5 ?F (36.9 ?C)  97.7 ?F (36.5 ?C)   ?TempSrc: Oral  Oral   ?SpO2: 94% 94% 96%   ?Weight:      ?Height:      ? ? ?Intake/Output Summary (Last 24 hours) at 04/12/2022 1102 ?Last data filed at 04/11/2022 1750 ?Gross per 24 hour  ?Intake 315 ml  ?Output --  ?Net 315 ml  ? ? ? ?  04/11/2022  ?  5:00 AM 04/10/2022  ?  4:12 AM 04/09/2022  ?  5:00 AM  ?Last 3 Weights  ?Weight (lbs) 118 lb 6.2 oz 123 lb 7.3 oz 119 lb 11.4 oz  ?Weight (kg) 53.7 kg 56 kg 54.3 kg  ?   ? ?Telemetry  ?  ?Atrial fibrillation with RVR up to 120 bpm at times- Personally Reviewed ? ?ECG  ?  ?No new tracings since 4/11 - Personally Reviewed ? ?Physical Exam  ? ?GEN: Well  nourished, well developed in no acute distress ?HEENT: Normal ?NECK: No JVD; No carotid bruits ?LYMPHATICS: No lymphadenopathy ?CARDIAC: Irregularly irregular and tachycardic, no murmurs, rubs, gallops ?RESPIRATORY:  Clear to auscultation without rales, wheezing or rhonchi  ?ABDOMEN: Soft, non-tender, non-distended ?MUSCULOSKELETAL:  No edema; No deformity  ?SKIN: Warm and dry ?NEUROLOGIC:  Alert and oriented x 3 ?PSYCHIATRIC:  Normal affect   ?Labs  ?  ?High Sensitivity Troponin:   ?Recent Labs  ?Lab 03/29/22 ?1403 03/29/22 ?1603  ?TROPONINIHS 20* 18*  ? ?   ?Chemistry ?Recent Labs  ?Lab 04/09/22 ?5697 04/10/22 ?0448 04/11/22 ?0350  ?NA 136 137 136  ?K 4.1 4.2 4.0  ?CL 102 102 101  ?CO2 28 28 28   ?GLUCOSE 140* 95 104*  ?BUN 29* 26* 27*  ?CREATININE 1.01* 0.99 0.96  ?CALCIUM 9.4 9.3 9.6  ?MG 1.9 1.9 1.8  ?ALBUMIN 3.0* 2.8* 2.8*  ?GFRNONAA 58* 59* >60  ?ANIONGAP 6 7 7   ? ?  ?Lipids No results for input(s): CHOL, TRIG, HDL, LABVLDL, LDLCALC, CHOLHDL in the last 168 hours.  ?Hematology ?Recent Labs  ?Lab 04/10/22 ?0448 04/10/22 ?1014 04/11/22 ?0350 04/11/22 ?1955  ?WBC 4.8  5.7 6.6  --   ?RBC 2.40* 2.30* 2.31*  --   ?HGB 7.8* 7.7* 7.8* 9.7*  ?HCT 24.7* 23.7* 23.7* 29.2*  ?MCV 102.9* 103.0* 102.6*  --   ?MCH 32.5 33.5 33.8  --   ?MCHC 31.6 32.5 32.9  --   ?RDW 15.9* 16.0* 15.9*  --   ?PLT 56* 55* 60*  --   ? ? ?Thyroid No results for input(s): TSH, FREET4 in the last 168 hours.  ?BNPNo results for input(s): BNP, PROBNP in the last 168 hours.  ?DDimer No results for input(s): DDIMER in the last 168 hours.  ? ?Radiology  ?  ?No results found. ? ?Cardiac Studies  ? ?Echocardiogram 03/30/22 ? 1. Left ventricular ejection fraction, by estimation, is 45 to 50%. Left  ?ventricular ejection fraction by 2D MOD biplane is 48.0 %. The left  ?ventricle has mildly decreased function. The left ventricle demonstrates  ?global hypokinesis. Left ventricular  ?diastolic function could not be evaluated.  ? 2. Right ventricular systolic  function is mildly reduced. The right  ?ventricular size is normal. There is mildly elevated pulmonary artery  ?systolic pressure. The estimated right ventricular systolic pressure is  ?85.2 mmHg.  ? 3. The mitral valve is abnormal. Mild to moderate mitral valve  ?regurgitation.  ? 4. The aortic valve is tricuspid. Aortic valve regurgitation is not  ?visualized. Aortic valve sclerosis is present, with no evidence of aortic  ?valve stenosis.  ? 5. The inferior vena cava is dilated in size with <50% respiratory  ?variability, suggesting right atrial pressure of 15 mmHg.  ? ?Comparison(s): Changes from prior study are noted. 10/27/2021: LVEF 60-65%.  ? ?Patient Profile  ?   ?76 y.o. female with a history of  paroxysmal atrial fibrillation not on anticoagulation, hypertension, hyperlipidemia, CKD stage III, COPD, CMML with associated pancytopenia, rheumatoid arthritis, and chronic back pain who was seen for evaluation of rapid atrial fibrillation. ? ?Assessment & Plan  ?  ?Paroxysmal Atrial Fibrillation  ?- Patient has been difficult to rate control this admission. Also does not have many treatment options as she has a prolonged QT and intermittently does not tolerate anticoagulation due to pancytopenia and now has concerns of bleeding with foot hematoma and some oral bleeding. ?-Added digoxin yesterday for better heart rate control ?-Heart rate remains in the low 100s to 120s. ?-cannot use Amio due to prolonged QTc and risk of conversion to NSR off anticoagulation ?-Although cardizem is not ideal given reduced EF, patient has very limited treatment options and cardizem+metoprolol are controlling her heart rate reasonably well. ?-off heparin for concern of bleeding and therefore not a candidate for TEE/DCCV ?-dizziness may be related to afib when in RVR. ?-Timing to restart anti-coagulation to be determined by oncology as platelet count still remains low ?-continue digoxin 0.125 mg daily, Cardizem CD 360 mg daily  ?-BP  is improved so we will increase Toprol-XL to 175 mg twice daily for better heart rate control  ? ?Dyspnea on exertion ?Cardiomyopathy ?Hypertension ?- LVEF 40-45% this admission  ?- CTA chest negative for PE but showed moderate right and trace left pleural effusions with signs suspicious for multifocal PNA ?- ABX, solumedrol, and nebs per RT and primary ?- GDMT includes toprol - losartan on hold for now due to soft BP ?- cardizem generally contraindicated with CM, but her rate control options are limited.  ?- has had intermittent diuresis but does not currently appear volume overloaded ?- will likely need access to PRN lasix for  weight gain, maintain low sodium diet ?-Continue Toprol-XL and Cardizem  ? ?CMML ?Pancytopenia ?- Hb stable at 9.7 after transfusion what is on his dose of Toprol dose of the highest dose of Toprol XL ?- Oncology following  ?  ?Deconditioned ?- unclear what her functional status is currently ?- continue working with PT and evaluate for ongoing dizziness with ambulation ? ?   ? ?For questions or updates, please contact [place] ?Please consult [email or web address] for contact info under  ? ?  ?   ?Signed, ?Fransico Him, MD  ?04/12/2022, 11:02 AM   ? ? ? ?  ?

## 2022-04-12 NOTE — TOC Progression Note (Signed)
Transition of Care (TOC) - Progression Note  ? ? ?Patient Details  ?Name: HANNE KEGG ?MRN: [id number] ?Date of Birth: 05-Mar-1946 ? ?Transition of Care (TOC) CM/SW Contact  ?Purcell Mouton, RN ?Phone Number: ?04/12/2022, 3:14 PM ? ?Clinical Narrative:    ?Pt's husband selected Engineer, water. Referral given to [facility]. Pt may discharge in the AM. ? ? ?  ?  ? ?Expected Discharge Plan and Services ?  ?  ?  ?  ?  ?                ?  ?  ?  ?  ?  ?  ?  ?  ?  ?  ? ? ?Social Determinants of Health (SDOH) Interventions ?  ? ?Readmission Risk Interventions ?   ? View : No data to display.  ?  ?  ?  ? ? ?

## 2022-04-12 NOTE — Progress Notes (Signed)
[facility] ?PROGRESS NOTE ? ? ? ?Progress Note  ?[name]  VHS:[id number] DOB: 1946-06-08 DOA: 03/29/2022 ?PCP: [name], MD  ? ? ? ?Brief Narrative:  ? ?[name] is an 76 y.o. 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  Started empirically on antibiotics per oncology, she was also started on IV steroids underwent ultrasound guided right-sided thoracocentesis on 03/30/2022 with removal of 500 cc of transudate fluid cytology showed mesothelial cells completed course of antibiotics respiratory symptoms resolved.  She.  Remained in persistent A-fib cardiology discontinued heparin on 04/08/2022 due to dorsal foot hematoma and thrombocytopenia. ? ?Oncology following for CMML and pancytopenia, patient will need to go to skilled nursing facility. ? ? ? ?Assessment/Plan:  ? ?Paroxysmal atrial fibrillation with RVR ([place]) ?Transthoracic echo showed an EF of 45%, TSH is normal she remains rate controlled in A-fib. ?Currently on Cardizem 360 CD, digoxin and metoprolol 150. ?Heparin discontinued due to left foot hematoma and worsening thrombocytopenia.  Not a candidate for TEE DCCV ?Optimizing electrolytes. ?Cannot his amiodarone due to prolonged QTc and risk of conversion to sinus rhythm started on dig. ?Titration of rate controlling medication per cardiology heart rate in the high 90s to low 100s at times she goes to 130s. ? ?Dyspnea on exertion ?Multifactorial in the setting of COPD  pleural effusion A-fib with RVR and cardiomyopathy anemia possible pneumonia and deconditioning. ?She relates her symptoms are improved. ?She completed her course of steroids and antibiotics. ?She is status post thoracocentesis with removal of 500 cc of transudate fluid on 03/30/2022, with cytology showing reactive mesothelial cells. ?Currently on Stiolto, continue nebulizers incentive spirometry. ?She continues to work with physical therapy. ? ?Chronic systolic CHF/cardiomyopathy/DOE ?Transthoracic echo as above. ?Intermittently placed on Lasix with good urine output. ?Appears euvolemic on physical exam. ?Monitor and replete electrolytes as needed. ?Further diuresis per cardiology. ?She is now off Lasix will likely need Lasix as needed if weight gain. ? ?Hematoma of the left foot: ?Likely in the setting of thrombocytopenia and heparin. ?Heparin discontinued 04/08/2022 ?Tylenol for pain as needed. ? ?Moderate protein caloric malnutrition/body max index of 21. ? ?Nutrition Problem: Moderate Malnutrition ?Etiology: chronic illness, cancer and cancer related treatments ?Signs/Symptoms: mild fat depletion, moderate muscle depletion, severe muscle depletion ?Interventions: Ensure Enlive (each supplement provides 350kcal and 20 grams of protein) ? ?Pancytopenia ([place]) ?Follow-up with oncology as an outpatient. ?In the setting of chemotherapy. ?Transfuse if hemoglobin less than 7 and platelets less than 20 or bleeding. ?She status post 1 unit of packed red blood cells yesterday, due to dyspnea on exertion. ?Physical deconditioning ?PT OT has evaluated the patient, she will need to go to skilled nursing facility.  Hemoglobin this morning is 9.7. ? ?CMML: ?On Remicade and low-dose prednisone per oncology. ?Bone marrow biopsy showed persistent CMML no other deep disease evidence of progression in bone marrow. ?Oncology holding on chemotherapy. ? ?Constipation: ?Now resolved. ? ?Hypokalemia: ?Repleted now resolved. ? ?Bilateral  pleural effusion: ?See dyspnea on exertion. ? ?Multifocal pneumonia: ?Completed course of antibiotics in house. ? ?History of  multiple pulmonary nodules: ?Increased in size on CT will need a follow-up imaging in 4 to 6 weeks to reevaluate. ? 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 ?Estimated body mass index is 20.32 kg/m? as calculated from the following: ?  Height as of this encounter: _0  (1.626 m). ?  Weight as of this encounter: 53.7 kg. ? ? ? ?DVT prophylaxis: SCD ?Family Communication:none ?Status is: Inpatient ?Remains inpatient appropriate because: For A-fib with RVR heart controlled not a candidate for anticoagulation ? ? ? ?Code Status:  ? ?  ?Code Status Orders  ?(From admission, onward)  ?  ? ? ?  ? ?  Start     Ordered  ? 03/29/22 1746  Full code  Continuous       ? 03/29/22 1747  ? ?  ?  ? ?  ? ?Code Status History   ? ? Date Active Date Inactive Code Status Order ID Comments User Context  ? 02/02/2017 1310 02/03/2017 1445 Full Code [id number]  Netta Cedars, MD Inpatient  ? 02/12/2015 1125 02/13/2015 0343 Full Code [id number]  Arne Cleveland, MD HOV  ? ?  ? ? ? ? ?IV Access:  ? ?Peripheral IV ? ? ?Procedures and diagnostic studies:  ? ?No results found. ? ? ?Medical Consultants:  ? ?None. ? ? ?Subjective:  ? ? ?[name]  ? ?Objective:  ? ? ?Vitals:  ? 04/11/22 1750 04/11/22 2042 04/12/22 0540 04/12/22 0738  ?BP: 133/64 128/78 119/69   ?Pulse: 89 77 97   ?Resp:  18 18   ?Temp: 97.9 ?F (36.6 ?C) 98.1 ?F (36.7 ?C) 98.5 ?F (36.9 ?C)   ?TempSrc: Oral Oral Oral   ?SpO2: 96% 96% 94% 94%  ?Weight:      ?Height:      ? ?SpO2: 94 % ?O2 Flow Rate (L/min): 3 L/min ? ? ?Intake/Output Summary (Last 24 hours) at 04/12/2022 0749 ?Last data filed at 04/11/2022 1750 ?Gross per 24 hour  ?Intake 315 ml  ?Output --  ?Net 315 ml  ? ?Filed Weights  ? 04/09/22 0500 04/10/22 0412 04/11/22 0500  ?Weight: 54.3 kg 56 kg 53.7 kg  ? ? ?Exam: ?General exam: In no acute distress. ?Respiratory system: Good air movement  and clear to auscultation. ?Cardiovascular system: S1 & S2 heard, RRR. No JVD. ?Gastrointestinal system: Abdomen is nondistended, soft and nontender.  ?Extremities: No pedal edema. ?Skin: No rashes, lesions or ulcers ?Psychiatry: Judgement and insight appear normal. Mood & affect appropriate.  ? ? ?Data Reviewed:  ? ? ?Labs: ?Basic Metabolic Panel: ?Recent Labs  ?Lab 04/07/22 ?9480 04/08/22 ?1655 04/09/22 ?3748 04/10/22 ?0448 04/11/22 ?0350  ?NA 139 138 136 137 136  ?K 3.7 3.7 4.1 4.2 4.0  ?CL 105 101 102 102 101  ?CO2 _1 ?GLUCOSE 101* 94 140* 95 104*  ?BUN 40* 33* 29* 26* 27*  ?CREATININE 0.95 0.98 1.01* 0.99 0.96  ?CALCIUM 9.1 9.5 9.4 9.3 9.6  ?MG 2.4 2.1 1.9 1.9 1.8  ?PHOS 3.0 3.7 4.5 5.5* 5.8*  ? ?GFR ?Estimated Creatinine Clearance: 42.9 mL/min (by C-G formula based on SCr of 0.96 mg/dL). ?Liver Function Tests: ?Recent Labs  ?Lab 04/07/22 ?2707 04/08/22 ?8675 04/09/22 ?4492 04/10/22 ?0448 04/11/22 ?0350  ?ALBUMIN 2.9* 3.0* 3.0* 2.8* 2.8*  ? ?No results for input(s): LIPASE, AMYLASE in the last 168 hours. ?No results for input(s): AMMONIA in the last 168 hours. ?Coagulation profile ?No results for input(s): INR, PROTIME in the last 168  hours. ?COVID-19 Labs ? ?No results for input(s): DDIMER, FERRITIN, LDH, CRP in the last 72 hours. ? ?Lab Results  ?Component Value Date  ? [place] NEGATIVE 03/29/2022  ? [place] NOT DETECTED 10/24/2019  ? ? ?CBC: ?Recent Labs  ?Lab 04/08/22 ?7366 04/09/22 ?8159 04/10/22 ?0448 04/10/22 ?1014 04/11/22 ?0350 04/11/22 ?1955  ?WBC 5.3 5.6 4.8 5.7 6.6  --   ?HGB 9.0* 8.9* 7.8* 7.7* 7.8* 9.7*  ?HCT 27.4* 27.6* 24.7* 23.7* 23.7* 29.2*  ?MCV 101.1* 101.8* 102.9* 103.0* 102.6*  --   ?PLT 65* 63* 56* 55* 60*  --   ? ?Cardiac Enzymes: ?No results for input(s): CKTOTAL, CKMB, CKMBINDEX, TROPONINI in the last 168 hours. ?BNP (last 3 results) ?No results for input(s): PROBNP in the last 8760 hours. ?CBG: ?No results for input(s): GLUCAP in the last 168 hours. ?D-Dimer: ?No  results for input(s): DDIMER in the last 72 hours. ?Hgb A1c: ?No results for input(s): HGBA1C in the last 72 hours. ?Lipid Profile: ?No results for input(s): CHOL, HDL, LDLCALC, TRIG, CHOLHDL, LDLDIRECT in

## 2022-04-13 ENCOUNTER — Inpatient Hospital Stay: Payer: Medicare Other

## 2022-04-13 ENCOUNTER — Encounter (HOSPITAL_COMMUNITY): Payer: Self-pay | Admitting: Hematology

## 2022-04-13 DIAGNOSIS — J189 Pneumonia, unspecified organism: Secondary | ICD-10-CM | POA: Diagnosis not present

## 2022-04-13 DIAGNOSIS — S9032XA Contusion of left foot, initial encounter: Secondary | ICD-10-CM | POA: Diagnosis not present

## 2022-04-13 DIAGNOSIS — J159 Unspecified bacterial pneumonia: Secondary | ICD-10-CM | POA: Diagnosis not present

## 2022-04-13 DIAGNOSIS — C931 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: Secondary | ICD-10-CM | POA: Diagnosis not present

## 2022-04-13 DIAGNOSIS — M7981 Nontraumatic hematoma of soft tissue: Secondary | ICD-10-CM | POA: Diagnosis not present

## 2022-04-13 DIAGNOSIS — R2689 Other abnormalities of gait and mobility: Secondary | ICD-10-CM | POA: Diagnosis not present

## 2022-04-13 DIAGNOSIS — J441 Chronic obstructive pulmonary disease with (acute) exacerbation: Secondary | ICD-10-CM | POA: Diagnosis not present

## 2022-04-13 DIAGNOSIS — R531 Weakness: Secondary | ICD-10-CM | POA: Diagnosis not present

## 2022-04-13 DIAGNOSIS — S92302A Fracture of unspecified metatarsal bone(s), left foot, initial encounter for closed fracture: Secondary | ICD-10-CM | POA: Diagnosis not present

## 2022-04-13 DIAGNOSIS — E440 Moderate protein-calorie malnutrition: Secondary | ICD-10-CM | POA: Diagnosis not present

## 2022-04-13 DIAGNOSIS — D61818 Other pancytopenia: Secondary | ICD-10-CM | POA: Diagnosis not present

## 2022-04-13 DIAGNOSIS — I5022 Chronic systolic (congestive) heart failure: Secondary | ICD-10-CM | POA: Diagnosis not present

## 2022-04-13 DIAGNOSIS — I4891 Unspecified atrial fibrillation: Secondary | ICD-10-CM | POA: Diagnosis not present

## 2022-04-13 DIAGNOSIS — C9210 Chronic myeloid leukemia, BCR/ABL-positive, not having achieved remission: Secondary | ICD-10-CM | POA: Diagnosis not present

## 2022-04-13 DIAGNOSIS — M25572 Pain in left ankle and joints of left foot: Secondary | ICD-10-CM | POA: Diagnosis not present

## 2022-04-13 DIAGNOSIS — I480 Paroxysmal atrial fibrillation: Secondary | ICD-10-CM | POA: Diagnosis not present

## 2022-04-13 DIAGNOSIS — K5903 Drug induced constipation: Secondary | ICD-10-CM | POA: Diagnosis not present

## 2022-04-13 DIAGNOSIS — R0600 Dyspnea, unspecified: Secondary | ICD-10-CM | POA: Diagnosis not present

## 2022-04-13 DIAGNOSIS — J449 Chronic obstructive pulmonary disease, unspecified: Secondary | ICD-10-CM | POA: Diagnosis not present

## 2022-04-13 DIAGNOSIS — J90 Pleural effusion, not elsewhere classified: Secondary | ICD-10-CM | POA: Diagnosis not present

## 2022-04-13 DIAGNOSIS — L03119 Cellulitis of unspecified part of limb: Secondary | ICD-10-CM | POA: Diagnosis not present

## 2022-04-13 DIAGNOSIS — R278 Other lack of coordination: Secondary | ICD-10-CM | POA: Diagnosis not present

## 2022-04-13 DIAGNOSIS — J810 Acute pulmonary edema: Secondary | ICD-10-CM | POA: Diagnosis not present

## 2022-04-13 DIAGNOSIS — N1831 Chronic kidney disease, stage 3a: Secondary | ICD-10-CM | POA: Diagnosis not present

## 2022-04-13 DIAGNOSIS — R21 Rash and other nonspecific skin eruption: Secondary | ICD-10-CM | POA: Diagnosis not present

## 2022-04-13 DIAGNOSIS — R911 Solitary pulmonary nodule: Secondary | ICD-10-CM | POA: Diagnosis not present

## 2022-04-13 DIAGNOSIS — Z743 Need for continuous supervision: Secondary | ICD-10-CM | POA: Diagnosis not present

## 2022-04-13 DIAGNOSIS — R41841 Cognitive communication deficit: Secondary | ICD-10-CM | POA: Diagnosis not present

## 2022-04-13 DIAGNOSIS — E876 Hypokalemia: Secondary | ICD-10-CM | POA: Diagnosis not present

## 2022-04-13 DIAGNOSIS — D508 Other iron deficiency anemias: Secondary | ICD-10-CM | POA: Diagnosis not present

## 2022-04-13 DIAGNOSIS — M6281 Muscle weakness (generalized): Secondary | ICD-10-CM | POA: Diagnosis not present

## 2022-04-13 MED ORDER — METOPROLOL SUCCINATE ER 25 MG PO TB24: 175.0000 mg | ORAL_TABLET | Freq: Two times a day (BID) | ORAL | 0 refills | Status: DC

## 2022-04-13 MED ORDER — PREDNISONE 5 MG PO TABS
5.0000 mg | ORAL_TABLET | Freq: Every day | ORAL | Status: DC
  Administered 2022-04-13: 5 mg via ORAL
  Filled 2022-04-13: qty 1

## 2022-04-13 MED ORDER — DIGOXIN 125 MCG PO TABS: 0.1250 mg | ORAL_TABLET | Freq: Every day | ORAL | 0 refills | Status: DC

## 2022-04-13 MED ORDER — METOPROLOL SUCCINATE ER 50 MG PO TB24: 175.0000 mg | ORAL_TABLET | Freq: Two times a day (BID) | ORAL | 1 refills | Status: DC

## 2022-04-13 MED ORDER — HEPARIN SOD (PORK) LOCK FLUSH 100 UNIT/ML IV SOLN
500.0000 [IU] | INTRAVENOUS | Status: AC | PRN
  Administered 2022-04-13: 500 [IU]
  Filled 2022-04-13: qty 5

## 2022-04-13 MED ORDER — BISACODYL 10 MG RE SUPP: 10.0000 mg | Freq: Every day | RECTAL | 0 refills | Status: DC | PRN

## 2022-04-13 MED ORDER — DILTIAZEM HCL ER COATED BEADS 360 MG PO CP24
360.0000 mg | ORAL_CAPSULE | Freq: Every day | ORAL | 0 refills | Status: DC
Start: 2022-04-13 — End: 2022-05-31

## 2022-04-13 NOTE — Progress Notes (Signed)
Nutrition Follow-up ? ?DOCUMENTATION CODES:  ? ?Non-severe (moderate) malnutrition in context of chronic illness ? ?INTERVENTION:  ?- continue Boost Plus TID. ?- will communicate with RD at [facility]. ? ? ?NUTRITION DIAGNOSIS:  ? ?Moderate Malnutrition related to chronic illness, cancer and cancer related treatments as evidenced by mild fat depletion, moderate muscle depletion, severe muscle depletion. -ongoing ? ?GOAL:  ? ?Patient will meet greater than or equal to 90% of their needs -unmet on average ? ?MONITOR:  ? ?PO intake, Supplement acceptance, Labs, Weight trends ? ? ?ASSESSMENT:  ? ?76 y.o. female with medical history of CMML currently on chemo, pancytopenia, rheumatoid arthritis on Remicade, A-fib, HTN, COPD, and bilateral pulmonary nodules. She presented to the ED due to worsening shortness of breath and was found to be in afib with RVR and started on IV Cardizem and heparin drips. CTA chest was negative for pulmonary embolism but showed bilateral pleural effusion (R>L), findings suggestive of multifocal PNA with numerous pleural-based R-sided masses, increased in size compared to previous CT. ? ?Limited meal completions documented this hospitalization: 0% of breakfast on 4/10, 10% of breakfast and 50% of lunch on 4/12, 10% of breakfast on 4/14. Per review of order, she has accepted all bottles of Boost Plus offered. ? ?Weight has been mainly stable over the past 1 week. Non-pitting edema to RUE and mild pitting edema to LUE and BLE documented in the edema section of flow sheet yesterday evening.  ? ?Discharge order and discharge summary entered earlier today for d/c to SNF.  ? ? ?Labs reviewed; BUN: 27 mg/dl, Phos: 5.8 mg/dl. ?Medications reviewed; 1 tablet multivitamin with minerals/day, 5 mg deltasone/day, 1 tablet senokot BID. ? ? ? ?Diet Order:   ?Diet Order   ? ?       ?  Diet - low sodium heart healthy       ?  ?  Diet Heart Room service appropriate? Yes; Fluid consistency: Thin; Fluid  restriction: 1500 mL Fluid  Diet effective now       ?  ? ?  ?  ? ?  ? ? ?EDUCATION NEEDS:  ? ?Education needs have been addressed ? ?Skin:  Skin Assessment: Reviewed RN Assessment ? ?Last BM:  4/19 (type 6 x2, medium amount and large amount) ? ?Height:  ? ?Ht Readings from Last 1 Encounters:  ?03/29/22 5' 4"  (1.626 m)  ? ? ?Weight:  ? ?Wt Readings from Last 1 Encounters:  ?04/13/22 55.2 kg  ? ? ? ?BMI:  Body mass index is 20.89 kg/m?. ? ?Estimated Nutritional Needs:  ?Kcal:  1800-2000 kcal ?Protein:  90-105 grams ?Fluid:  >/= 2 L/day ? ? ? ? ? ? ?Jarome Matin, MS, RD, LDN ?Registered Dietitian II ?Inpatient Clinical Nutrition ?RD pager # and on-call/weekend pager # available in [place]  ? ?

## 2022-04-13 NOTE — Progress Notes (Signed)
Occupational Therapy Treatment ?Patient Details ?Name: [name] ?MRN: [id number] ?DOB: 20-Sep-1946 ?Today's Date: 04/13/2022 ? ? ?History of present illness 76 y.o. female with medical history significant of  CMML, currently on chemotherapy, pancytopenia, rheumatoid arthritis on Remicade, paroxysmal A-fib (off of Eliquis for the last 2 days prior to presentation?  Secondary to bleeding complications versus pancytopenia), hypertension, COPD (follows up with pulmonary as an outpatient), bilateral pulmonary nodules presented with worsening shortness of breath. Found to be in A-fib with RVR.. CTA chest was negative for pulmonary embolism but showed bilateral pleural effusion, more on the right side with findings suggestive of multifocal pneumonia with numerous pleural-based right-sided masses ?  ?OT comments ? Treatment focused on advancing self care abilities, functional mobility and education in regards to POC. Patient tolerated limited ambulation today due to pain in left foot from hematoma. She is flexed over walker and bearing weight through her forearms instead of her hands. She is overall min assist to power up from seated position. Improved ability to assist with toileting today - managing pericare - therapist assisted with managing clothing. Spouse in room had questions in regards to POC and why she needed SNF and therapist answered all questions. Spouse agreeable to SNF at end of treatment.   ? ?Recommendations for follow up therapy are one component of a multi-disciplinary discharge planning process, led by the attending physician.  Recommendations may be updated based on patient status, additional functional criteria and insurance authorization. ?   ?Follow Up Recommendations ? Skilled nursing-short term rehab (<3 hours/day)  ?  ?Assistance Recommended at Discharge Frequent or constant Supervision/Assistance  ?Patient can return home with the following ? A little help with walking and/or  transfers;Assistance with cooking/housework;Help with stairs or ramp for entrance;A lot of help with bathing/dressing/bathroom ?  ?Equipment Recommendations ? None recommended by OT  ?  ?Recommendations for Other Services   ? ?  ?Precautions / Restrictions Precautions ?Precautions: None ?Precaution Comments: monitor HR and sat, afib,has a hematoma L dorsum foot ?Restrictions ?Weight Bearing Restrictions: No  ? ? ?  ? ?Mobility Bed Mobility ?  ?  ?  ?  ?  ?  ?  ?  ?  ? ?Transfers ?  ?  ?  ?  ?  ?  ?  ?  ?  ?  ?  ?  ?Balance Overall balance assessment: Needs assistance ?Sitting-balance support: No upper extremity supported ?Sitting balance-Leahy Scale: Good ?  ?  ?Standing balance support: During functional activity, Bilateral upper extremity supported, Reliant on assistive device for balance ?Standing balance-Leahy Scale: Poor ?Standing balance comment: reliant on walker ?  ?  ?  ?  ?  ?  ?  ?  ?  ?  ?  ?   ? ?ADL either performed or assessed with clinical judgement  ? ?ADL Overall ADL's : Needs assistance/impaired ?  ?  ?Grooming: Set up;Wash/dry face;Wash/dry hands;Sitting ?  ?  ?  ?  ?  ?  ?  ?  ?  ?Toilet Transfer: BSC/3in1;Minimal assistance;Rolling walker (2 wheels) ?Toilet Transfer Details (indicate cue type and reason): Min assist for power up. Ambulated 6 feet to [facility]. ?Toileting- Clothing Manipulation and Hygiene: Minimal assistance;Sitting/lateral lean ?Toileting - Clothing Manipulation Details (indicate cue type and reason): min assist for clothing management. Patient able to clean herself in seated position. ?  ?  ?Functional mobility during ADLs: Minimal assistance;Rolling walker (2 wheels) ?General ADL Comments: Min assist for power up. Min guard for ambulation with  walker. Patient exhibiting difficulty with staying uprgint - she is flexed over walker and bearing down through forearms instead of walker. Reports pain in left foot with weight bearing. ?  ? ?Extremity/Trunk Assessment Upper Extremity  Assessment ?Upper Extremity Assessment: Generalized weakness ?  ?  ?  ?  ?  ? ?Vision Patient Visual Report: No change from baseline ?  ?  ?Perception   ?  ?Praxis   ?  ? ?Cognition Arousal/Alertness: Awake/alert ?Behavior During Therapy: [facility] for tasks assessed/performed ?Overall Cognitive Status: Within Functional Limits for tasks assessed ?  ?  ?  ?  ?  ?  ?  ?  ?  ?  ?  ?  ?  ?  ?  ?  ?  ?  ?  ?   ?Exercises   ? ?  ?Shoulder Instructions   ? ? ?  ?General Comments    ? ? ?Pertinent Vitals/ Pain       Pain Assessment ?Pain Assessment: Faces ?Faces Pain Scale: Hurts even more ?Pain Location: Foot - with weight bearing ?Pain Descriptors / Indicators: Grimacing, Aching, Guarding ?Pain Intervention(s): Limited activity within patient's tolerance ? ?Home Living   ?  ?  ?  ?  ?  ?  ?  ?  ?  ?  ?  ?  ?  ?  ?  ?  ?  ?  ? ?  ?Prior Functioning/Environment    ?  ?  ?  ?   ? ?Frequency ? Min 2X/week  ? ? ? ? ?  ?Progress Toward Goals ? ?OT Goals(current goals can now be found in the care plan section) ? Progress towards OT goals: Progressing toward goals ? ?Acute Rehab OT Goals ?Patient Stated Goal: get stronger ?OT Goal Formulation: With patient ?Time For Goal Achievement: 04/16/22 ?Potential to Achieve Goals: Good  ?Plan Discharge plan remains appropriate   ? ?Co-evaluation ? ? ?   ?  ?  ?OT goals addressed during session: ADL's and self-care ?  ? ?  ?AM-PAC OT "6 Clicks" Daily Activity     ?Outcome Measure ? ? Help from another person eating meals?: None ?Help from another person taking care of personal grooming?: A Little ?Help from another person toileting, which includes using toliet, bedpan, or urinal?: A Little ?Help from another person bathing (including washing, rinsing, drying)?: A Little ?Help from another person to put on and taking off regular upper body clothing?: A Little ?Help from another person to put on and taking off regular lower body clothing?: A Lot ?6 Click Score: 18 ? ?  ?End of Session Equipment  Utilized During Treatment: Rolling walker (2 wheels) ? ?OT Visit Diagnosis: Muscle weakness (generalized) (M62.81) ?  ?Activity Tolerance Patient limited by pain ?  ?Patient Left in chair;with call bell/phone within reach;with chair alarm set;with family/visitor present ?  ?Nurse Communication Mobility status ?  ? ?   ? ?Time: 1610-9604 ?OT Time Calculation (min): 28 min ? ?Charges: OT General Charges ?$OT Visit: 1 Visit ?OT Treatments ?$Self Care/Home Management : 23-37 mins ? ?Danyal Adorno, OTR/L ?Acute Care Rehab Services  ?Office [phone] ?Pager: [phone]  ? ?Kandy Towery L Demario Faniel ?04/13/2022, 11:27 AM ?

## 2022-04-13 NOTE — TOC Progression Note (Signed)
Transition of Care (TOC) - Progression Note  ? ? ?Patient Details  ?Name: [name] ?MRN: [id number] ?Date of Birth: August 06, 1946 ? ?Transition of Care (TOC) CM/SW Contact  ?Purcell Mouton, RN ?Phone Number: ?04/13/2022, 10:13 AM ? ?Clinical Narrative:    ? ?[facility] was called, RN, pt and pt's husband are aware.  ? ?  ?  ? ?Expected Discharge Plan and Services ?  ?  ?  ?  ?  ?Expected Discharge Date: 04/13/22               ?  ?  ?  ?  ?  ?  ?  ?  ?  ?  ? ? ?Social Determinants of Health (SDOH) Interventions ?  ? ?Readmission Risk Interventions ?   ? View : No data to display.  ?  ?  ?  ? ? ?

## 2022-04-13 NOTE — Plan of Care (Signed)
?  Problem: Education: ?Goal: Knowledge of disease or condition will improve ?Outcome: Adequate for Discharge ?Goal: Knowledge of the prescribed therapeutic regimen will improve ?Outcome: Adequate for Discharge ?Goal: Individualized Educational Video(s) ?Outcome: Adequate for Discharge ?  ?Problem: Activity: ?Goal: Ability to tolerate increased activity will improve ?Outcome: Adequate for Discharge ?Goal: Will verbalize the importance of balancing activity with adequate rest periods ?Outcome: Adequate for Discharge ?  ?Problem: Respiratory: ?Goal: Ability to maintain a clear airway will improve ?Outcome: Adequate for Discharge ?Goal: Levels of oxygenation will improve ?Outcome: Adequate for Discharge ?Goal: Ability to maintain adequate ventilation will improve ?Outcome: Adequate for Discharge ?  ?Problem: Activity: ?Goal: Ability to tolerate increased activity will improve ?Outcome: Adequate for Discharge ?  ?Problem: Clinical Measurements: ?Goal: Ability to maintain a body temperature in the normal range will improve ?Outcome: Adequate for Discharge ?  ?Problem: Respiratory: ?Goal: Ability to maintain adequate ventilation will improve ?Outcome: Adequate for Discharge ?Goal: Ability to maintain a clear airway will improve ?Outcome: Adequate for Discharge ?  ?

## 2022-04-13 NOTE — Discharge Summary (Addendum)
Physician Discharge Summary  ?[name] SJG:[id number] DOB: 07/16/1946 DOA: 03/29/2022 ? ?PCP: [name], MD ? ?Admit date: 03/29/2022 ?Discharge date: 04/13/2022 ? ?Admitted From: Home ?Disposition:  SNF ? ?Recommendations for Outpatient Follow-up:  ?Follow up with Cards in 1-2 weeks, evaluated Lasix needs to be started as an outpatient. ?Please obtain BMP/CBC in one week ?Follow-up with PCP in 4-6 weeks you will need a follow-up CT scan to follow-up on pulmonary nodules. ? ? ?Home Health:No ?Equipment/Devices:None ? ?Discharge Condition:Stable ?CODE STATUS: Full ?Diet recommendation: Heart Healthy  ? ?Brief/Interim Summary: ?76 y.o. 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  Started empirically on antibiotics per oncology, she was also started on IV steroids underwent ultrasound guided right-sided thoracocentesis on 03/30/2022 with removal of 500 cc of transudate fluid cytology showed mesothelial cells completed course of antibiotics respiratory symptoms resolved.  She.  Remained in persistent A-fib cardiology discontinued heparin on 04/08/2022 due to dorsal foot hematoma and thrombocytopenia. ?  ?Oncology following for CMML and pancytopenia, patient will need to go to skilled nursing facility. ? ?Discharge Diagnoses:  ?Paroxysmal atrial fibrillation with RVR ([place]) ? transthoracic echo showed an EF of 45%, TSH within normal limits. ?She was started on IV diltiazem and metoprolol cardiology was  consulted recommended to add dig. ?Her metoprolol was increased to 175, Cardizem maxed out at 360 and digoxin was added. ?She was started initially on IV heparin but had to be discontinued due to thrombocytopenia and left foot hematoma she is no longer a candidate for anticoagulation. ?Cardiology deemed her not a candidate for DCCV. ?We cannot use amiodarone due to prolonged QTc and the risk of conversion to sinus rhythm. ?Physical therapy evaluated the patient and recommended home health PT. ?Cardiology will see her in 1 to 2 weeks ? ?Dyspnea on exertion: ?Multifactorial in the setting of COPD, pleural effusion A-fib with RVR and cardiomyopathy possibly pneumonia and deconditioning. ?She completed her course of steroids and antibiotics in house. ?She status post ?Thoracocentesis with removal of 500 cc of transudate fluid on 03/30/2022 cytology showed reactive mesothelial cells. ?Continue Stiolto nebulizers. ?Physical therapy recommended short-term rehab. ? ?Chronic systolic heart failure ?Transthoracic echo showed an EF of 45%, she was intermittently placed on Lasix with good urine output. ?She appears euvolemic on physical exam. ?She will follow-up with cardiology as an outpatient and start Lasix as needed. ? ?Left foot hematoma: ?Likely due to thrombocytopenia and heparin use. ?Heparin was discontinued continue Tylenol for pain now resolved. ? ?Protein caloric malnutrition: ? ?CMML/pancytopenia: ?Follow-up with oncology as an outpatient in the setting of chemotherapy. ?On Remicade and low-dose prednisone per oncology. ? ?Constipation: ?Resolved with stool softeners. ? ?Hypokalemia: ?Repleted orally now resolved. ? ?Bilateral pleural effusion: ?Severe dyspnea on exertion for further details. ? ?Multifocal pneumonia: ?She completed course of antibiotics in house. ? ?History of multiple pulmonary nodules: ?Increasing size she will need a follow-up CT scan in 4 to 6 weeks. ? ?Stage II moderate COPD Gold. ? ? ?Discharge  Instructions ? ?Discharge Instructions   ? ? Diet - low sodium heart  healthy   Complete by: As directed ?  ? Increase activity slowly   Complete by: As directed ?  ? No wound care   Complete by: As directed ?  ? ?  ? ?Allergies as of 04/13/2022   ? ?   Reactions  ? Gluten Meal Diarrhea  ? Penicillins Rash, Other (See Comments)  ? 'Many years ago'  ? Alendronate Rash  ? Hydroxychloroquine Rash  ? ?  ? ?  ?Medication List  ?  ? ?STOP taking these medications   ? ?losartan 25 MG tablet ?Commonly known as: COZAAR ?  ?metoprolol tartrate 25 MG tablet ?Commonly known as: LOPRESSOR ?  ?vitamin C 500 MG tablet ?Commonly known as: ASCORBIC ACID ?  ? ?  ? ?TAKE these medications   ? ?acetaminophen 500 MG tablet ?Commonly known as: TYLENOL ?Take 1,000 mg by mouth in the morning. ?  ?bisacodyl 10 MG suppository ?Commonly known as: DULCOLAX ?Place 1 suppository (10 mg total) rectally daily as needed for moderate constipation. ?  ?Breztri Aerosphere 160-9-4.8 MCG/ACT Aero ?Generic drug: Budeson-Glycopyrrol-Formoterol ?Inhale 2 puffs into the lungs in the morning and at bedtime. ?  ?CALCIUM 1200+D3 PO ?Take 1 tablet by mouth daily. ?  ?denosumab 60 MG/ML Sosy injection ?Commonly known as: PROLIA ?Inject 60 mg into the skin every 6 (six) months. ?  ?digoxin 0.125 MG tablet ?Commonly known as: LANOXIN ?Take 1 tablet (0.125 mg total) by mouth daily. ?  ?diltiazem 360 MG 24 hr capsule ?Commonly known as: CARDIZEM CD ?Take 1 capsule (360 mg total) by mouth daily. ?  ?gabapentin 300 MG capsule ?Commonly known as: NEURONTIN ?Take 300 mg by mouth in the morning. ?  ?lidocaine-prilocaine cream ?Commonly known as: EMLA ?Apply to affected area once ?What changed:  ?how much to take ?how to take this ?when to take this ?additional instructions ?  ?metoprolol succinate 50 MG 24 hr tablet ?Commonly known as: TOPROL-XL ?Take 3.5 tablets (175 mg total) by mouth 2 (two) times daily. ?What changed:  ?how much to take ?when to take this ?   ?predniSONE 5 MG tablet ?Commonly known as: DELTASONE ?Take 5 mg by mouth daily with breakfast. ?  ?REMICADE IV ?Inject 10 mg into the vein every 2 (two) months. ?  ?Stiolto Respimat 2.5-2.5 MCG/ACT Aers ?Generic drug: Tiotropium Bromide-Olodaterol ?Inhale 2 puffs into the lungs daily. ?  ? ?  ? ? Contact information for follow-up providers   ? ? [name], Wonda Cheng, MD Follow up on 04/28/2022.   ?Specialty: Cardiology ?Why: at 10:20AM for your cardiology follow up appointment ?Contact information: ?[address] ?[address] ?[place] [address] ?[phone] ? ? ?  ?  ? ?  ?  ? ? Contact information for after-discharge care   ? ? Destination   ? ? [facility] .   ?Service: Skilled Nursing ?Contact information: ?[facility] ?[place] ?[phone] ? ?  ?  ? ?  ?  ? ?  ?  ? ?  ? ?Allergies  ?Allergen Reactions  ? Gluten Meal Diarrhea  ? Penicillins Rash and Other (See Comments)  ?  'Many years ago' ?  ? Alendronate Rash  ? Hydroxychloroquine Rash  ? ? ?Consultations: ?Cardiology ? ? ?Procedures/Studies: ?DG Chest 1 View ? ?Result Date: 03/30/2022 ?CLINICAL DATA:  Status post thoracentesis EXAM: CHEST  1 VIEW COMPARISON:  03/29/2022 FINDINGS: Interval reduction in volume of a right pleural effusion, now minimal. No pneumothorax. Unchanged small left  pleural effusion. Cardiomegaly. Right chest port catheter. Right shoulder reverse arthroplasty. IMPRESSION: Interval reduction in volume of a right pleural effusion, now minimal. No pneumothorax. Unchanged small left pleural effusion. Electronically Signed   By: Delanna Ahmadi M.D.   On: 03/30/2022 12:45  ? ?CT Angio Chest PE W and/or Wo Contrast ? ?Result Date: 03/29/2022 ?CLINICAL DATA:  Tachycardia, shortness of breath. Pulmonary embolism (PE) suspected, high prob. History of chronic myelomonocytic leukemia EXAM: CT ANGIOGRAPHY CHEST WITH CONTRAST TECHNIQUE: Multidetector CT imaging of the chest was performed using the  standard protocol during bolus administration of intravenous contrast. Multiplanar CT image reconstructions and MIPs were obtained to evaluate the vascular anatomy. RADIATION DOSE REDUCTION: This exam was perfor

## 2022-04-13 NOTE — Progress Notes (Signed)
Attempted to call for report to receiving facility (SNF) twice but unable to get hold of the unit manager [place]. Able to talk to the reception [place]. Will re attempt to call again latter. ?

## 2022-04-14 ENCOUNTER — Inpatient Hospital Stay: Payer: Medicare Other

## 2022-04-14 DIAGNOSIS — D61818 Other pancytopenia: Secondary | ICD-10-CM | POA: Diagnosis not present

## 2022-04-14 DIAGNOSIS — J90 Pleural effusion, not elsewhere classified: Secondary | ICD-10-CM | POA: Diagnosis not present

## 2022-04-14 DIAGNOSIS — J159 Unspecified bacterial pneumonia: Secondary | ICD-10-CM | POA: Diagnosis not present

## 2022-04-14 DIAGNOSIS — E440 Moderate protein-calorie malnutrition: Secondary | ICD-10-CM | POA: Diagnosis not present

## 2022-04-14 DIAGNOSIS — L03119 Cellulitis of unspecified part of limb: Secondary | ICD-10-CM | POA: Diagnosis not present

## 2022-04-14 DIAGNOSIS — M7981 Nontraumatic hematoma of soft tissue: Secondary | ICD-10-CM | POA: Diagnosis not present

## 2022-04-14 DIAGNOSIS — J441 Chronic obstructive pulmonary disease with (acute) exacerbation: Secondary | ICD-10-CM | POA: Diagnosis not present

## 2022-04-14 DIAGNOSIS — C9210 Chronic myeloid leukemia, BCR/ABL-positive, not having achieved remission: Secondary | ICD-10-CM | POA: Diagnosis not present

## 2022-04-14 DIAGNOSIS — R0600 Dyspnea, unspecified: Secondary | ICD-10-CM | POA: Diagnosis not present

## 2022-04-14 DIAGNOSIS — I5022 Chronic systolic (congestive) heart failure: Secondary | ICD-10-CM | POA: Diagnosis not present

## 2022-04-14 DIAGNOSIS — R911 Solitary pulmonary nodule: Secondary | ICD-10-CM | POA: Diagnosis not present

## 2022-04-14 DIAGNOSIS — I480 Paroxysmal atrial fibrillation: Secondary | ICD-10-CM | POA: Diagnosis not present

## 2022-04-17 ENCOUNTER — Other Ambulatory Visit: Payer: Self-pay | Admitting: *Deleted

## 2022-04-17 ENCOUNTER — Inpatient Hospital Stay: Payer: Medicare Other

## 2022-04-17 NOTE — Patient Outreach (Signed)
[facility] Post- Acute Care Coordinator follow up. Per [place] eligible member currently resides in [facility].  Screening for care coordination/care management services.  ? ?Mrs. [name] admitted to SNF on 04/13/22 after hospitalization. ? ?Member's PCP at [place] [facility] has [facility] care management services. ? ?Will continue to follow while member resides in SNF and plan to send notification to [facility] care management upon SNF discharge.  ? ?Mrs. [name], per chart, has medical history of CHF, COPD, CMML on chemotherapy, PAF. ? ?Will send secure notification to [facility] team when member transitions to home from SNF if needed.  ? ? ?Sherry Rolling, MSN, RN,BSN ?[facility] Coordinator ?[phone] [facility]) ?[phone]  (Toll free office)   ?

## 2022-04-19 ENCOUNTER — Encounter: Payer: Self-pay | Admitting: Hematology

## 2022-04-19 DIAGNOSIS — I5022 Chronic systolic (congestive) heart failure: Secondary | ICD-10-CM | POA: Diagnosis not present

## 2022-04-19 DIAGNOSIS — L03119 Cellulitis of unspecified part of limb: Secondary | ICD-10-CM | POA: Diagnosis not present

## 2022-04-19 DIAGNOSIS — E440 Moderate protein-calorie malnutrition: Secondary | ICD-10-CM | POA: Diagnosis not present

## 2022-04-19 DIAGNOSIS — D61818 Other pancytopenia: Secondary | ICD-10-CM | POA: Diagnosis not present

## 2022-04-19 DIAGNOSIS — I480 Paroxysmal atrial fibrillation: Secondary | ICD-10-CM | POA: Diagnosis not present

## 2022-04-19 DIAGNOSIS — J90 Pleural effusion, not elsewhere classified: Secondary | ICD-10-CM | POA: Diagnosis not present

## 2022-04-19 DIAGNOSIS — R21 Rash and other nonspecific skin eruption: Secondary | ICD-10-CM | POA: Diagnosis not present

## 2022-04-19 DIAGNOSIS — C9210 Chronic myeloid leukemia, BCR/ABL-positive, not having achieved remission: Secondary | ICD-10-CM | POA: Diagnosis not present

## 2022-04-19 DIAGNOSIS — S92302A Fracture of unspecified metatarsal bone(s), left foot, initial encounter for closed fracture: Secondary | ICD-10-CM | POA: Diagnosis not present

## 2022-04-19 DIAGNOSIS — R0600 Dyspnea, unspecified: Secondary | ICD-10-CM | POA: Diagnosis not present

## 2022-04-19 DIAGNOSIS — J159 Unspecified bacterial pneumonia: Secondary | ICD-10-CM | POA: Diagnosis not present

## 2022-04-19 DIAGNOSIS — M7981 Nontraumatic hematoma of soft tissue: Secondary | ICD-10-CM | POA: Diagnosis not present

## 2022-04-20 DIAGNOSIS — J90 Pleural effusion, not elsewhere classified: Secondary | ICD-10-CM | POA: Diagnosis not present

## 2022-04-20 DIAGNOSIS — I480 Paroxysmal atrial fibrillation: Secondary | ICD-10-CM | POA: Diagnosis not present

## 2022-04-20 DIAGNOSIS — D61818 Other pancytopenia: Secondary | ICD-10-CM | POA: Diagnosis not present

## 2022-04-20 DIAGNOSIS — S92302A Fracture of unspecified metatarsal bone(s), left foot, initial encounter for closed fracture: Secondary | ICD-10-CM | POA: Diagnosis not present

## 2022-04-20 DIAGNOSIS — I5022 Chronic systolic (congestive) heart failure: Secondary | ICD-10-CM | POA: Diagnosis not present

## 2022-04-20 DIAGNOSIS — C9210 Chronic myeloid leukemia, BCR/ABL-positive, not having achieved remission: Secondary | ICD-10-CM | POA: Diagnosis not present

## 2022-04-20 DIAGNOSIS — R0600 Dyspnea, unspecified: Secondary | ICD-10-CM | POA: Diagnosis not present

## 2022-04-20 DIAGNOSIS — E440 Moderate protein-calorie malnutrition: Secondary | ICD-10-CM | POA: Diagnosis not present

## 2022-04-20 DIAGNOSIS — J159 Unspecified bacterial pneumonia: Secondary | ICD-10-CM | POA: Diagnosis not present

## 2022-04-20 DIAGNOSIS — J441 Chronic obstructive pulmonary disease with (acute) exacerbation: Secondary | ICD-10-CM | POA: Diagnosis not present

## 2022-04-20 DIAGNOSIS — M7981 Nontraumatic hematoma of soft tissue: Secondary | ICD-10-CM | POA: Diagnosis not present

## 2022-04-20 DIAGNOSIS — R911 Solitary pulmonary nodule: Secondary | ICD-10-CM | POA: Diagnosis not present

## 2022-04-21 ENCOUNTER — Ambulatory Visit (INDEPENDENT_AMBULATORY_CARE_PROVIDER_SITE_OTHER): Payer: Medicare Other

## 2022-04-21 ENCOUNTER — Ambulatory Visit (INDEPENDENT_AMBULATORY_CARE_PROVIDER_SITE_OTHER): Payer: Medicare Other | Admitting: Physician Assistant

## 2022-04-21 ENCOUNTER — Telehealth: Payer: Self-pay | Admitting: Physician Assistant

## 2022-04-21 DIAGNOSIS — M25572 Pain in left ankle and joints of left foot: Secondary | ICD-10-CM

## 2022-04-21 DIAGNOSIS — S92302A Fracture of unspecified metatarsal bone(s), left foot, initial encounter for closed fracture: Secondary | ICD-10-CM | POA: Insufficient documentation

## 2022-04-21 NOTE — Telephone Encounter (Signed)
Received call from [name] asking what is the next plan of care for the patient. I read note stating patient was to wear compression sock which patient said she had a home and to not bear weight on foot as much as possible. Patient is scheduled to see Dr. Sharol [name] next week.   The number to [name] is  [phone]   ?

## 2022-04-21 NOTE — Progress Notes (Signed)
? ?Office Visit Note ?  ?Patient: [name]           ?Date of Birth: 23-Feb-1946           ?MRN: [id number] ?Visit Date: 04/21/2022 ?             ?Requested by: [name], MD ?[place] ?[address] ?[place],  [place] [address] ?PCP: [name], MD ? ?Chief Complaint  ?Patient presents with  ? Left Foot - Pain  ? ? ? ? ?HPI: ?Patient is a pleasant 75 year old woman who is accompanied by her husband.  She presents with a chief complaint of left foot discoloration and pain.  Her husband states that she was hospitalized in the ICU a few weeks ago.  This was for her cardiac condition.  When she went to the rehab they noticed that her foot was discolored and bruised.  An x-ray at the rehab have per her report showed that her foot was fractured.  She is not wearing any particular shoe wear today.  She has history of CMML. ? ?Assessment & Plan: ?Visit Diagnoses:  ?1. Pain in left ankle and joints of left foot   ? ? ?Plan: Transverse fracture of the proximal third of the second metatarsal.  She does have some early callus formation.  I would like for her to go into a postop shoe.  She has significant bruising about the foot and I would recommend a compression sock which she says she has at home.  I would like her to have close follow-up next week with Dr. Sharol Given I would like her to limit her weightbearing is much as possible ? ?Follow-Up Instructions: No follow-ups on file.  ? ?Ortho Exam ? ?Patient is alert, oriented, no adenopathy, well-dressed, normal affect, normal respiratory effort. ?Examination of her left foot.  She does have a palpable dorsalis pedis pulse.  She has significant swelling and bruising over the dorsum and plantar surface of her foot.  She does have tenderness over the midfoot.  She has ecchymosis into the toes.  Compartments are soft and compressible she has good capillary refill of her toes ? ?Imaging: ?No results found. ?No images are attached to the encounter. ? ?Labs: ?Lab  Results  ?Component Value Date  ? ESRSEDRATE 13 02/04/2015  ? LABURIC 6.5 02/20/2022  ? ? ? ?Lab Results  ?Component Value Date  ? ALBUMIN 2.8 (L) 04/11/2022  ? ALBUMIN 2.8 (L) 04/10/2022  ? ALBUMIN 3.0 (L) 04/09/2022  ? ? ?Lab Results  ?Component Value Date  ? MG 1.8 04/11/2022  ? MG 1.9 04/10/2022  ? MG 1.9 04/09/2022  ? ?No results found for: VD25OH ? ?No results found for: PREALBUMIN ? ?  Latest Ref Rng & Units 04/11/2022  ?  7:55 PM 04/11/2022  ?  3:50 AM 04/10/2022  ? 10:14 AM  ?CBC EXTENDED  ?WBC 4.0 - 10.5 K/uL  6.6   5.7    ?RBC 3.87 - 5.11 MIL/uL  2.31   2.30    ?Hemoglobin 12.0 - 15.0 g/dL 9.7   7.8   7.7    ?HCT 36.0 - 46.0 % 29.2   23.7   23.7    ?Platelets 150 - 400 K/uL  60   55    ? ? ? ?There is no height or weight on file to calculate BMI. ? ?Orders:  ?Orders Placed This Encounter  ?Procedures  ? XR Foot Complete Left  ? ?No orders of the defined types were  placed in this encounter. ? ? ? Procedures: ?No procedures performed ? ?Clinical Data: ?No additional findings. ? ?ROS: ? ?All other systems negative, except as noted in the HPI. ?Review of Systems ? ?Objective: ?Vital Signs: There were no vitals taken for this visit. ? ?Specialty Comments:  ?No specialty comments available. ? ?PMFS History: ?Patient Active Problem List  ? Diagnosis Date Noted  ? Hematoma of left foot 04/08/2022  ? Malnutrition of moderate degree 04/06/2022  ? Chronic systolic CHF/cardiomyopathy/DOE 04/05/2022  ? Dyspnea on exertion 04/05/2022  ? Hypokalemia 04/05/2022  ? Physical deconditioning 04/05/2022  ? Chronic kidney disease, stage 3a ([place]) 04/05/2022  ? Constipation 04/05/2022  ? Paroxysmal atrial fibrillation with RVR ([place]) 03/29/2022  ? Multifocal pneumonia 03/29/2022  ? Pancytopenia ([place]) 03/29/2022  ? Bilateral pleural effusion 03/29/2022  ? Port-A-Cath in place 02/06/2022  ? CMML (chronic myelomonocytic leukemia) ([place]) 01/12/2022  ? Stage 2 moderate COPD by GOLD classification ([place]) 05/03/2021  ? Hx of multiple  pulmonary nodules 05/03/2021  ? Dermatochalasis 08/12/2019  ? 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 Anemia of chronic disease 07/06/2015  ? ?Past Medical History:  ?Diagnosis Date  ? Arthritis   ? Rheumatoid arthritis  ? Celiac disease   ? Chronic kidney disease   ? stage 3 from MD notes  ? COPD (chronic obstructive pulmonary disease) ([place])   ? Dyspnea   ? with going up stairs  ? Family history of adverse reaction to anesthesia   ? father had hard time waking up  ? Headache   ? sinus headaches  ? Hot flashes   ? Hypertension   ? Iron deficiency anemia   ? Pneumonia   ? per patient "I have walking pneumonia"  ?  ?Family History  ?Problem Relation Age of Onset  ? Peripheral Artery Disease Father   ? Diabetes Father   ?  ?Past Surgical History:  ?Procedure Laterality Date  ? COLONOSCOPY    ? ECTOPIC PREGNANCY SURGERY    ?  x 2  ? IR IMAGING GUIDED PORT INSERTION  01/31/2022  ? REVERSE SHOULDER ARTHROPLASTY Right 02/02/2017  ? Procedure: RIGHT REVERSE SHOULDER ARTHROPLASTY;  Surgeon: Netta Cedars, MD;  Location: [facility];  Service: Orthopedics;  Laterality: Right;  ? ?Social History  ? ?Occupational History  ? Not on file  ?Tobacco Use  ? Smoking status: Former  ?  Packs/day: 1.00  ?  Years: 30.00  ?  Pack years: 30.00  ?  Types: Cigarettes  ?  Quit date: 2005  ?  Years since quitting: 18.3  ? Smokeless tobacco: Never  ?Vaping Use  ? Vaping Use: Never used  ?Substance and Sexual Activity  ? Alcohol use: Yes  ?  Alcohol/week: 12.0 - 14.0 standard drinks  ?  Types: 12 - 14 Glasses of wine per week  ? Drug use: No  ? Sexual activity: Not on file  ? ? ? ? ? ?

## 2022-04-24 ENCOUNTER — Other Ambulatory Visit: Payer: Medicare Other

## 2022-04-24 DIAGNOSIS — R911 Solitary pulmonary nodule: Secondary | ICD-10-CM | POA: Diagnosis not present

## 2022-04-24 DIAGNOSIS — J90 Pleural effusion, not elsewhere classified: Secondary | ICD-10-CM | POA: Diagnosis not present

## 2022-04-24 DIAGNOSIS — C9210 Chronic myeloid leukemia, BCR/ABL-positive, not having achieved remission: Secondary | ICD-10-CM | POA: Diagnosis not present

## 2022-04-24 DIAGNOSIS — E440 Moderate protein-calorie malnutrition: Secondary | ICD-10-CM | POA: Diagnosis not present

## 2022-04-24 DIAGNOSIS — I5022 Chronic systolic (congestive) heart failure: Secondary | ICD-10-CM | POA: Diagnosis not present

## 2022-04-24 DIAGNOSIS — I480 Paroxysmal atrial fibrillation: Secondary | ICD-10-CM | POA: Diagnosis not present

## 2022-04-24 DIAGNOSIS — D508 Other iron deficiency anemias: Secondary | ICD-10-CM | POA: Diagnosis not present

## 2022-04-24 DIAGNOSIS — S92302A Fracture of unspecified metatarsal bone(s), left foot, initial encounter for closed fracture: Secondary | ICD-10-CM | POA: Diagnosis not present

## 2022-04-24 DIAGNOSIS — J159 Unspecified bacterial pneumonia: Secondary | ICD-10-CM | POA: Diagnosis not present

## 2022-04-24 DIAGNOSIS — J441 Chronic obstructive pulmonary disease with (acute) exacerbation: Secondary | ICD-10-CM | POA: Diagnosis not present

## 2022-04-24 DIAGNOSIS — R0600 Dyspnea, unspecified: Secondary | ICD-10-CM | POA: Diagnosis not present

## 2022-04-24 DIAGNOSIS — M7981 Nontraumatic hematoma of soft tissue: Secondary | ICD-10-CM | POA: Diagnosis not present

## 2022-04-26 DIAGNOSIS — M7981 Nontraumatic hematoma of soft tissue: Secondary | ICD-10-CM | POA: Diagnosis not present

## 2022-04-26 DIAGNOSIS — J90 Pleural effusion, not elsewhere classified: Secondary | ICD-10-CM | POA: Diagnosis not present

## 2022-04-26 DIAGNOSIS — S92302A Fracture of unspecified metatarsal bone(s), left foot, initial encounter for closed fracture: Secondary | ICD-10-CM | POA: Diagnosis not present

## 2022-04-26 DIAGNOSIS — I480 Paroxysmal atrial fibrillation: Secondary | ICD-10-CM | POA: Diagnosis not present

## 2022-04-26 DIAGNOSIS — E440 Moderate protein-calorie malnutrition: Secondary | ICD-10-CM | POA: Diagnosis not present

## 2022-04-26 DIAGNOSIS — R911 Solitary pulmonary nodule: Secondary | ICD-10-CM | POA: Diagnosis not present

## 2022-04-26 DIAGNOSIS — R0600 Dyspnea, unspecified: Secondary | ICD-10-CM | POA: Diagnosis not present

## 2022-04-26 DIAGNOSIS — C9210 Chronic myeloid leukemia, BCR/ABL-positive, not having achieved remission: Secondary | ICD-10-CM | POA: Diagnosis not present

## 2022-04-26 DIAGNOSIS — I5022 Chronic systolic (congestive) heart failure: Secondary | ICD-10-CM | POA: Diagnosis not present

## 2022-04-26 DIAGNOSIS — J441 Chronic obstructive pulmonary disease with (acute) exacerbation: Secondary | ICD-10-CM | POA: Diagnosis not present

## 2022-04-26 DIAGNOSIS — D508 Other iron deficiency anemias: Secondary | ICD-10-CM | POA: Diagnosis not present

## 2022-04-26 DIAGNOSIS — J159 Unspecified bacterial pneumonia: Secondary | ICD-10-CM | POA: Diagnosis not present

## 2022-04-27 ENCOUNTER — Encounter: Payer: Self-pay | Admitting: Cardiovascular Disease

## 2022-04-27 ENCOUNTER — Ambulatory Visit (INDEPENDENT_AMBULATORY_CARE_PROVIDER_SITE_OTHER): Payer: Medicare Other | Admitting: Orthopedic Surgery

## 2022-04-27 DIAGNOSIS — M25572 Pain in left ankle and joints of left foot: Secondary | ICD-10-CM

## 2022-04-27 NOTE — Progress Notes (Signed)
?Cardiology Office Note:   ? ?Date:  04/28/2022  ? ?ID:  [name], DOB 02/23/1946, MRN [id number] ? ?PCP:  [name], MD ?  ?[facility] Providers ?Cardiologist:  Mertie Moores, MD    ? ?Referring MD: [name], MD  ? ?Chief Complaint  ?Patient presents with  ? Atrial Fibrillation  ? ? ?Oct. 11, 2022  ? ?[name] is a 76 y.o. female with a hx of back pain,  CKD, HLD, COPD, paroxysmal A. fib.  We are asked to see her today by Dr. Laurann Montana for further evaluation and management of her paroxysmal atrial fibrillation. ? ?She has not yet been started on anticoagulation. ?CHADS2VASC is 51 ( female, age, HTN)  ? ?No hx of CVA, CHF, DM , no vascular disease  ? ? ?Asymptomatic  ?Cannot tell when her HR is irreg.  ?Has bilateral keep issues ? ?Has a hx of COPE but does not get short of breath  ?Takes inhaler for COPD  ?Is not able to get much exercise for the past 2 months due to knee pain  ?Has a herniated disc in her back  ?Notes from Dr. Laurann Montana indicate a previous GI bleed and colonoscopy with Dr Collene Mares - she does not recall this .  ? ?Jan. 17, 2023 : ?Seen for her PAF  ?Is due for her colonoscopy   ?Instructed her to hold eliquis for 2 days  ?No symptoms that she could say is Afib  ? ?Apr 28, 2022: ? ?[name] is seen for follow up of her PAF. Hx of COPD ?She has CML ?Was recently hospitalized with anemia , rapid atrial fib, pleural effusions ? ?Has been in the hospital , now in rehab ?Was In rapid Afib  ?HR was eventually controlled with high dose toprol, Digoxin and breast lungs reduce your risk of having developing pneumonia from fluid we will get with that admission is many 100s of pages long do not at the least to some risk I will have time to go through all but but that you are you are seen by my partner since it was there is no need and recreating what happened further you were there for weeks it looks like you are 10 days or 9 days ? ?Past Medical History:  ?Diagnosis Date  ? Arthritis   ?  Rheumatoid arthritis  ? Celiac disease   ? Chronic kidney disease   ? stage 3 from MD notes  ? COPD (chronic obstructive pulmonary disease) ([place])   ? Dyspnea   ? with going up stairs  ? Family history of adverse reaction to anesthesia   ? father had hard time waking up  ? Headache   ? sinus headaches  ? Hot flashes   ? Hypertension   ? Iron deficiency anemia   ? Pneumonia   ? per patient "I have walking pneumonia"  ? ? ?Past Surgical History:  ?Procedure Laterality Date  ? COLONOSCOPY    ? ECTOPIC PREGNANCY SURGERY    ?  x 2  ? IR IMAGING GUIDED PORT INSERTION  01/31/2022  ? REVERSE SHOULDER ARTHROPLASTY Right 02/02/2017  ? Procedure: RIGHT REVERSE SHOULDER ARTHROPLASTY;  Surgeon: Netta Cedars, MD;  Location: [facility];  Service: Orthopedics;  Laterality: Right;  ? ? ?Current Medications: ?Current Meds  ?Medication Sig  ? acetaminophen (TYLENOL) 500 MG tablet Take 1,000 mg by mouth in the morning.  ? bisacodyl (DULCOLAX) 10 MG suppository Place 1 suppository (10 mg total) rectally daily as needed for  moderate constipation.  ? Calcium-Magnesium-Vitamin D (CALCIUM 1200+D3 PO) Take 1 tablet by mouth daily.  ? denosumab (PROLIA) 60 MG/ML SOSY injection Inject 60 mg into the skin every 6 (six) months.  ? digoxin (LANOXIN) 0.125 MG tablet Take 1 tablet (0.125 mg total) by mouth daily.  ? diltiazem (CARDIZEM CD) 360 MG 24 hr capsule Take 1 capsule (360 mg total) by mouth daily.  ? gabapentin (NEURONTIN) 300 MG capsule Take 300 mg by mouth in the morning.  ? InFLIXimab (REMICADE IV) Inject 10 mg into the vein every 2 (two) months.  ? lidocaine-prilocaine (EMLA) cream Apply to affected area once  ? metoprolol succinate (TOPROL-XL) 50 MG 24 hr tablet Take 3.5 tablets (175 mg total) by mouth 2 (two) times daily.  ? predniSONE (DELTASONE) 5 MG tablet Take 5 mg by mouth daily with breakfast.  ?  ? ?Allergies:   Gluten meal, Penicillins, Alendronate, and Hydroxychloroquine  ? ?Social History  ? ?Socioeconomic History  ? Marital status:  Married  ?  Spouse name: Not on file  ? Number of children: Not on file  ? Years of education: Not on file  ? Highest education level: Not on file  ?Occupational History  ? Not on file  ?Tobacco Use  ? Smoking status: Former  ?  Packs/day: 1.00  ?  Years: 30.00  ?  Pack years: 30.00  ?  Types: Cigarettes  ?  Quit date: 2005  ?  Years since quitting: 18.3  ? Smokeless tobacco: Never  ?Vaping Use  ? Vaping Use: Never used  ?Substance and Sexual Activity  ? Alcohol use: Yes  ?  Alcohol/week: 12.0 - 14.0 standard drinks  ?  Types: 12 - 14 Glasses of wine per week  ? Drug use: No  ? Sexual activity: Not on file  ?Other Topics Concern  ? Not on file  ?Social History Narrative  ? Not on file  ? ?Social Determinants of Health  ? ?Financial Resource Strain: Not on file  ?Food Insecurity: Not on file  ?Transportation Needs: Not on file  ?Physical Activity: Not on file  ?Stress: Not on file  ?Social Connections: Not on file  ?  ? ?Family History: ?The patient's family history includes Diabetes in her father; Peripheral Artery Disease in her father. ? ?ROS:   ?Please see the history of present illness.    ? All other systems reviewed and are negative. ? ?EKGs/Labs/Other Studies Reviewed:   ? ?The following studies were reviewed today: ?- previous ecgs  ? ? ? ?Recent Labs: ?03/29/2022: B Natriuretic Peptide 474.3; TSH 0.731 ?03/30/2022: ALT 14 ?04/11/2022: BUN 27; Creatinine, Ser 0.96; Hemoglobin 9.7; Magnesium 1.8; Platelets 60; Potassium 4.0; Sodium 136  ?Recent Lipid Panel ?No results found for: CHOL, TRIG, HDL, CHOLHDL, VLDL, LDLCALC, LDLDIRECT ? ? ?Risk Assessment/Calculations:   ? ?CHA2DS2-VASc Score = 4  ?} This indicates a 4.8% annual risk of stroke. ?The patient's score is based upon: ?CHF History: 0 ?HTN History: 1 ?Diabetes History: 0 ?Stroke History: 0 ?Vascular Disease History: 0 ?Age Score: 2 ?Gender Score: 1 ?  ? ?    ? ?Physical Exam:   ? ?Physical Exam: ?Blood pressure 124/70, pulse 80, height 5' 4"  (1.626 m), weight  116 lb 6.4 oz (52.8 kg), SpO2 96 %. ? ?GEN:  Well nourished, well developed in no acute distress ?HEENT: Normal ?NECK: No JVD; No carotid bruits ?LYMPHATICS: No lymphadenopathy ?CARDIAC: irreg. Irreg.  ?RESPIRATORY:  Clear to auscultation without rales, wheezing or rhonchi  ?  ABDOMEN: Soft, non-tender, non-distended ?MUSCULOSKELETAL:  No edema; No deformity  ?SKIN: Warm and dry ?NEUROLOGIC:  Alert and oriented x 3 ? ? ?EKG:    ? ?ASSESSMENT:   ? ?1. Paroxysmal atrial fibrillation with RVR ([place])   ? ? ? ?PLAN:   ?  ? ?Atrial fib:    ? ?HR is well cont ?There is a bit of confusion about her medications but to the best of our determinations she is on high-dose Toprol-XL 350 mg a day divided into 2 doses, Cardizem CD 360 mg a day and Lanoxin 0.125 mg a day.  Her husband will verify that her list is correct when he goes home and will notify us if there is any discrepancy. ? ?He states that she drinks about a bottle of wine every day.  I suspect this may be due to some of her decompensation.  I have asked her to moderate this. ? ?We will have her see our APP in 6 months. ? ? ?   ? ? ?Medication Adjustments/Labs and Tests Ordered: ?Current medicines are reviewed at length with the patient today.  Concerns regarding medicines are outlined above.  ?No orders of the defined types were placed in this encounter. ? ? ?No orders of the defined types were placed in this encounter. ? ? ? ?Patient Instructions  ?Medication Instructions:  ?Your physician recommends that you continue on your current medications as directed. Please refer to the Current Medication list given to you today. ? ?*If you need a refill on your cardiac medications before your next appointment, please call your pharmacy* ? ?Follow-Up: ?At [facility], you and your health needs are our priority.  As part of our continuing mission to provide you with exceptional heart care, we have created designated Provider Care Teams.  These Care Teams include your primary  Cardiologist (physician) and Advanced Practice Providers (APPs -  Physician Assistants and Nurse Practitioners) who all work together to provide you with the care you need, when you need it. ? ?Your next

## 2022-04-28 ENCOUNTER — Ambulatory Visit (INDEPENDENT_AMBULATORY_CARE_PROVIDER_SITE_OTHER): Payer: Medicare Other | Admitting: Cardiovascular Disease

## 2022-04-28 ENCOUNTER — Telehealth: Payer: Self-pay | Admitting: Physician Assistant

## 2022-04-28 ENCOUNTER — Encounter: Payer: Self-pay | Admitting: Cardiovascular Disease

## 2022-04-28 ENCOUNTER — Other Ambulatory Visit: Payer: Self-pay | Admitting: *Deleted

## 2022-04-28 VITALS — BP 124/70 | HR 80 | Ht 64.0 in | Wt 116.4 lb

## 2022-04-28 DIAGNOSIS — I480 Paroxysmal atrial fibrillation: Secondary | ICD-10-CM | POA: Diagnosis not present

## 2022-04-28 NOTE — Telephone Encounter (Signed)
.  Called patient to schedule appointment per 5/3 inbasket, patient is aware of date and time.   ?

## 2022-04-28 NOTE — Patient Outreach (Signed)
[facility] Post- Acute Care Coordinator follow up.  ? ?Verified in [facility] Mrs. Luger discharged from [facility] on 04/28/22 to home. She will have home health. ? ?Mrs. Thang's PCP [facility] has [facility] care management. Will send secre notification to [facility].  ? ?Mrs. Motl has medical history of CHF, COPD, PAF.  ? ? ?Marthenia Rolling, MSN, RN,BSN ?[place] Coordinator ?[phone] [facility]) ?[phone]  (Toll free office)  ?

## 2022-04-28 NOTE — Patient Instructions (Signed)
Medication Instructions:  ?Your physician recommends that you continue on your current medications as directed. Please refer to the Current Medication list given to you today. ? ?*If you need a refill on your cardiac medications before your next appointment, please call your pharmacy* ? ?Follow-Up: ?At [facility], you and your health needs are our priority.  As part of our continuing mission to provide you with exceptional heart care, we have created designated Provider Care Teams.  These Care Teams include your primary Cardiologist (physician) and Advanced Practice Providers (APPs -  Physician Assistants and Nurse Practitioners) who all work together to provide you with the care you need, when you need it. ? ?Your next appointment:   ?6 month(s) ? ?The format for your next appointment:   ?In Person ? ?Provider:   ?Dr. Acie Fredrickson or APP{ ? ?Important Information About [place] ? ? ? ? ?  ?

## 2022-04-30 DIAGNOSIS — E440 Moderate protein-calorie malnutrition: Secondary | ICD-10-CM | POA: Diagnosis not present

## 2022-04-30 DIAGNOSIS — J159 Unspecified bacterial pneumonia: Secondary | ICD-10-CM | POA: Diagnosis not present

## 2022-04-30 DIAGNOSIS — Z87891 Personal history of nicotine dependence: Secondary | ICD-10-CM | POA: Diagnosis not present

## 2022-04-30 DIAGNOSIS — I480 Paroxysmal atrial fibrillation: Secondary | ICD-10-CM | POA: Diagnosis not present

## 2022-04-30 DIAGNOSIS — J441 Chronic obstructive pulmonary disease with (acute) exacerbation: Secondary | ICD-10-CM | POA: Diagnosis not present

## 2022-04-30 DIAGNOSIS — I130 Hypertensive heart and chronic kidney disease with heart failure and stage 1 through stage 4 chronic kidney disease, or unspecified chronic kidney disease: Secondary | ICD-10-CM | POA: Diagnosis not present

## 2022-04-30 DIAGNOSIS — Z96611 Presence of right artificial shoulder joint: Secondary | ICD-10-CM | POA: Diagnosis not present

## 2022-04-30 DIAGNOSIS — Z7952 Long term (current) use of systemic steroids: Secondary | ICD-10-CM | POA: Diagnosis not present

## 2022-04-30 DIAGNOSIS — C9210 Chronic myeloid leukemia, BCR/ABL-positive, not having achieved remission: Secondary | ICD-10-CM | POA: Diagnosis not present

## 2022-04-30 DIAGNOSIS — D508 Other iron deficiency anemias: Secondary | ICD-10-CM | POA: Diagnosis not present

## 2022-04-30 DIAGNOSIS — I429 Cardiomyopathy, unspecified: Secondary | ICD-10-CM | POA: Diagnosis not present

## 2022-04-30 DIAGNOSIS — I2699 Other pulmonary embolism without acute cor pulmonale: Secondary | ICD-10-CM | POA: Diagnosis not present

## 2022-04-30 DIAGNOSIS — K900 Celiac disease: Secondary | ICD-10-CM | POA: Diagnosis not present

## 2022-04-30 DIAGNOSIS — M069 Rheumatoid arthritis, unspecified: Secondary | ICD-10-CM | POA: Diagnosis not present

## 2022-04-30 DIAGNOSIS — N1831 Chronic kidney disease, stage 3a: Secondary | ICD-10-CM | POA: Diagnosis not present

## 2022-04-30 DIAGNOSIS — I5022 Chronic systolic (congestive) heart failure: Secondary | ICD-10-CM | POA: Diagnosis not present

## 2022-04-30 DIAGNOSIS — Z9181 History of falling: Secondary | ICD-10-CM | POA: Diagnosis not present

## 2022-04-30 DIAGNOSIS — J90 Pleural effusion, not elsewhere classified: Secondary | ICD-10-CM | POA: Diagnosis not present

## 2022-04-30 DIAGNOSIS — S9032XD Contusion of left foot, subsequent encounter: Secondary | ICD-10-CM | POA: Diagnosis not present

## 2022-04-30 DIAGNOSIS — D61818 Other pancytopenia: Secondary | ICD-10-CM | POA: Diagnosis not present

## 2022-04-30 DIAGNOSIS — R32 Unspecified urinary incontinence: Secondary | ICD-10-CM | POA: Diagnosis not present

## 2022-05-02 ENCOUNTER — Encounter: Payer: Self-pay | Admitting: Orthopedic Surgery

## 2022-05-02 DIAGNOSIS — R5383 Other fatigue: Secondary | ICD-10-CM | POA: Diagnosis not present

## 2022-05-02 DIAGNOSIS — Z79899 Other long term (current) drug therapy: Secondary | ICD-10-CM | POA: Diagnosis not present

## 2022-05-02 DIAGNOSIS — M0579 Rheumatoid arthritis with rheumatoid factor of multiple sites without organ or systems involvement: Secondary | ICD-10-CM | POA: Diagnosis not present

## 2022-05-02 DIAGNOSIS — Z111 Encounter for screening for respiratory tuberculosis: Secondary | ICD-10-CM | POA: Diagnosis not present

## 2022-05-02 NOTE — Progress Notes (Signed)
? ?Office Visit Note ?  ?Patient: [name]           ?Date of Birth: May 31, 1946           ?MRN: [id number] ?Visit Date: 04/27/2022 ?             ?Requested by: [name], MD ?[place] ?[address] ?[place],  [place] [address] ?PCP: [name], MD ? ?Chief Complaint  ?Patient presents with  ? Left Foot - Follow-up  ? ? ? ? ?HPI: ?Patient is a 76 year old woman is seen for initial evaluation for fractures across the base of the second and third metatarsals.  Patient is currently in a compression sock and a postoperative shoe.  Patient has previously been in the ICU with diagnoses with both A-fib and CLL. ? ?Assessment & Plan: ?Visit Diagnoses:  ?1. Pain in left ankle and joints of left foot   ? ? ?Plan: Patient was unable to use the postoperative shoe we have placed her in a short fracture boot.  Recommended elevation and compression for the venous insufficiency.  And will repeat three-view radiographs of the left foot at follow-up. ? ?Follow-Up Instructions: Return in about 4 weeks (around 05/25/2022).  ? ?Ortho Exam ? ?Patient is alert, oriented, no adenopathy, well-dressed, normal affect, normal respiratory effort. ?Examination patient has a good dorsalis pedis pulse she does have swelling in the foot with venous stasis insufficiency.  Radiograph shows good callus formation around the fracture base of the second metatarsal.  Patient is still tender to palpation. ? ?Imaging: ?No results found. ?No images are attached to the encounter. ? ?Labs: ?Lab Results  ?Component Value Date  ? ESRSEDRATE 13 02/04/2015  ? LABURIC 6.5 02/20/2022  ? ? ? ?Lab Results  ?Component Value Date  ? ALBUMIN 2.8 (L) 04/11/2022  ? ALBUMIN 2.8 (L) 04/10/2022  ? ALBUMIN 3.0 (L) 04/09/2022  ? ? ?Lab Results  ?Component Value Date  ? MG 1.8 04/11/2022  ? MG 1.9 04/10/2022  ? MG 1.9 04/09/2022  ? ?No results found for: VD25OH ? ?No results found for: PREALBUMIN ? ?  Latest Ref Rng & Units 04/11/2022  ?  7:55 PM 04/11/2022  ?   3:50 AM 04/10/2022  ? 10:14 AM  ?CBC EXTENDED  ?WBC 4.0 - 10.5 K/uL  6.6   5.7    ?RBC 3.87 - 5.11 MIL/uL  2.31   2.30    ?Hemoglobin 12.0 - 15.0 g/dL 9.7   7.8   7.7    ?HCT 36.0 - 46.0 % 29.2   23.7   23.7    ?Platelets 150 - 400 K/uL  60   55    ? ? ? ?There is no height or weight on file to calculate BMI. ? ?Orders:  ?No orders of the defined types were placed in this encounter. ? ?No orders of the defined types were placed in this encounter. ? ? ? Procedures: ?No procedures performed ? ?Clinical Data: ?No additional findings. ? ?ROS: ? ?All other systems negative, except as noted in the HPI. ?Review of Systems ? ?Objective: ?Vital Signs: There were no vitals taken for this visit. ? ?Specialty Comments:  ?No specialty comments available. ? ?PMFS History: ?Patient Active Problem List  ? Diagnosis Date Noted  ? Closed fracture of metatarsal bone of left foot 04/21/2022  ? Hematoma of left foot 04/08/2022  ? Malnutrition of moderate degree 04/06/2022  ? Chronic systolic CHF/cardiomyopathy/DOE 04/05/2022  ? Dyspnea on exertion 04/05/2022  ? Hypokalemia  04/05/2022  ? Physical deconditioning 04/05/2022  ? Chronic kidney disease, stage 3a ([place]) 04/05/2022  ? Constipation 04/05/2022  ? Paroxysmal atrial fibrillation with RVR ([place]) 03/29/2022  ? Multifocal pneumonia 03/29/2022  ? Pancytopenia ([place]) 03/29/2022  ? Bilateral pleural effusion 03/29/2022  ? Port-A-Cath in place 02/06/2022  ? 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 Stage 2 moderate COPD by GOLD classification ([place]) 05/03/2021  ? Hx of multiple pulmonary nodules 05/03/2021  ? Dermatochalasis 08/12/2019  ? 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 Anemia of chronic disease 07/06/2015  ? ?Past Medical History:  ?Diagnosis Date  ? Arthritis   ? Rheumatoid arthritis  ? Celiac disease   ? Chronic kidney disease   ? stage 3 from MD notes  ? COPD (chronic obstructive pulmonary disease) ([place])   ? Dyspnea   ? with going up stairs  ? Family history  of adverse reaction to anesthesia   ? father had hard time waking up  ? Headache   ? sinus headaches  ? Hot flashes   ? Hypertension   ? Iron deficiency anemia   ? Pneumonia   ? per patient "I have walking pneumonia"  ?  ?Family History  ?Problem Relation Age of Onset  ? Peripheral Artery Disease Father   ? Diabetes Father   ?  ?Past Surgical History:  ?Procedure Laterality Date  ? COLONOSCOPY    ? ECTOPIC PREGNANCY SURGERY    ?  x 2  ? IR IMAGING GUIDED PORT INSERTION  01/31/2022  ? REVERSE SHOULDER ARTHROPLASTY Right 02/02/2017  ? Procedure: RIGHT REVERSE SHOULDER ARTHROPLASTY;  Surgeon: Netta Cedars, MD;  Location: [facility];  Service: Orthopedics;  Laterality: Right;  ? ?Social History  ? ?Occupational History  ? Not on file  ?Tobacco Use  ? Smoking status: Former  ?  Packs/day: 1.00  ?  Years: 30.00  ?  Pack years: 30.00  ?  Types: Cigarettes  ?  Quit date: 2005  ?  Years since quitting: 18.3  ? Smokeless tobacco: Never  ?Vaping Use  ? Vaping Use: Never used  ?Substance and Sexual Activity  ? Alcohol use: Yes  ?  Alcohol/week: 12.0 - 14.0 standard drinks  ?  Types: 12 - 14 Glasses of wine per week  ? Drug use: No  ? Sexual activity: Not on file  ? ? ? ? ? ?

## 2022-05-03 DIAGNOSIS — I130 Hypertensive heart and chronic kidney disease with heart failure and stage 1 through stage 4 chronic kidney disease, or unspecified chronic kidney disease: Secondary | ICD-10-CM | POA: Diagnosis not present

## 2022-05-03 DIAGNOSIS — D508 Other iron deficiency anemias: Secondary | ICD-10-CM | POA: Diagnosis not present

## 2022-05-03 DIAGNOSIS — I5022 Chronic systolic (congestive) heart failure: Secondary | ICD-10-CM | POA: Diagnosis not present

## 2022-05-03 DIAGNOSIS — E440 Moderate protein-calorie malnutrition: Secondary | ICD-10-CM | POA: Diagnosis not present

## 2022-05-03 DIAGNOSIS — C9210 Chronic myeloid leukemia, BCR/ABL-positive, not having achieved remission: Secondary | ICD-10-CM | POA: Diagnosis not present

## 2022-05-03 DIAGNOSIS — N1831 Chronic kidney disease, stage 3a: Secondary | ICD-10-CM | POA: Diagnosis not present

## 2022-05-05 DIAGNOSIS — I130 Hypertensive heart and chronic kidney disease with heart failure and stage 1 through stage 4 chronic kidney disease, or unspecified chronic kidney disease: Secondary | ICD-10-CM | POA: Diagnosis not present

## 2022-05-05 DIAGNOSIS — D508 Other iron deficiency anemias: Secondary | ICD-10-CM | POA: Diagnosis not present

## 2022-05-05 DIAGNOSIS — R21 Rash and other nonspecific skin eruption: Secondary | ICD-10-CM | POA: Diagnosis not present

## 2022-05-05 DIAGNOSIS — N1831 Chronic kidney disease, stage 3a: Secondary | ICD-10-CM | POA: Diagnosis not present

## 2022-05-05 DIAGNOSIS — I5022 Chronic systolic (congestive) heart failure: Secondary | ICD-10-CM | POA: Diagnosis not present

## 2022-05-05 DIAGNOSIS — E440 Moderate protein-calorie malnutrition: Secondary | ICD-10-CM | POA: Diagnosis not present

## 2022-05-05 DIAGNOSIS — C9210 Chronic myeloid leukemia, BCR/ABL-positive, not having achieved remission: Secondary | ICD-10-CM | POA: Diagnosis not present

## 2022-05-06 DIAGNOSIS — R21 Rash and other nonspecific skin eruption: Secondary | ICD-10-CM | POA: Diagnosis not present

## 2022-05-07 ENCOUNTER — Encounter: Payer: Self-pay | Admitting: Pulmonary Disease

## 2022-05-07 ENCOUNTER — Encounter: Payer: Self-pay | Admitting: Cardiovascular Disease

## 2022-05-07 DIAGNOSIS — R21 Rash and other nonspecific skin eruption: Secondary | ICD-10-CM

## 2022-05-08 DIAGNOSIS — D508 Other iron deficiency anemias: Secondary | ICD-10-CM | POA: Diagnosis not present

## 2022-05-08 DIAGNOSIS — I130 Hypertensive heart and chronic kidney disease with heart failure and stage 1 through stage 4 chronic kidney disease, or unspecified chronic kidney disease: Secondary | ICD-10-CM | POA: Diagnosis not present

## 2022-05-08 DIAGNOSIS — C9210 Chronic myeloid leukemia, BCR/ABL-positive, not having achieved remission: Secondary | ICD-10-CM | POA: Diagnosis not present

## 2022-05-08 DIAGNOSIS — E440 Moderate protein-calorie malnutrition: Secondary | ICD-10-CM | POA: Diagnosis not present

## 2022-05-08 DIAGNOSIS — I5022 Chronic systolic (congestive) heart failure: Secondary | ICD-10-CM | POA: Diagnosis not present

## 2022-05-08 DIAGNOSIS — N1831 Chronic kidney disease, stage 3a: Secondary | ICD-10-CM | POA: Diagnosis not present

## 2022-05-08 NOTE — Telephone Encounter (Signed)
Really tough situation since she has very limited treatment options. Beta blockers, diltiazem and digoxin can all cause rash. CCB and BB are not knew but they are both now at very high doses. ? ?She would benefit from seeing a dermatologist. ? ?Digoxin is the newest medication, not a sign of toxicity, but is known to cause a rash. ?Due to the complexity of this case, will need to defer to MD.   ?

## 2022-05-08 NOTE — Telephone Encounter (Signed)
I agree with Hughie Closs   Pt needs an urgent derm referral befor making signficant cahnges in meds  ?

## 2022-05-08 NOTE — Telephone Encounter (Addendum)
Called and spoke to patient who states that since her admission into the hospital last month, she has had a rash that began on her back, then worked around to her stomach and arms. Pt states while in the hospital, she was using a "liquid medication" on it that seemed to help decently. States that the rash was only mild and partly dissipated until a few days ago when it seemed to flare up. Now says rash is down legs and into scalp. Describes the rash as intense itching with raised, red dots. Has seen PCP and was prescribed Prednisone 74m to take 3 tabs for 2 days, 2 tabs for 2 days, then 1 tablet for 2 days (she is currently on her second day of the 2 tablets dose) and is not getting relief. [facility] and they added Cimetadine 2024mto take three times daily. Pt still has not had relief. Believes it's one of her medications that's causing it, because she cannot attribute it to any lifestyle change. Educated pt that the Eliquis she was formerly on would not be a replacement for Digoxin as it is factor X inhibitor and the meds she's questioning are antiarrhymics/calcium channel blocker/beta blockers. She may be allergic to one of the three, or something else entirely, but will route to pharmD and covering MD (RHarrington Challengerfor thoughts. Of note, seen in chart that pt also recently tried a new inhaler called Breztri. ?

## 2022-05-08 NOTE — Addendum Note (Signed)
Addended by: Ma Hillock on: 05/08/2022 03:56 PM ? ? Modules accepted: Orders ? ?

## 2022-05-08 NOTE — Telephone Encounter (Signed)
Called and spoke with patient. She states that she has a dermatologist and is due for upcoming annual appt she believes in the next few weeks. She requested, and husband, that we place an urgent referral on her behalf so that she can hopefully be seen sooner. ?

## 2022-05-09 ENCOUNTER — Telehealth: Payer: Self-pay | Admitting: Hematology

## 2022-05-09 NOTE — Progress Notes (Deleted)
[facility] OFFICE PROGRESS NOTE  [name], MD [place] [address] [place] [address]  DIAGNOSIS: CMML (chronic myelomonocytic leukemia)   Oncology History  CMML (chronic myelomonocytic leukemia) ([place])  11/09/2021 Initial Biopsy   DIAGNOSIS:   -  Monoclonal B-cell population with co-expression of CD5 comprises 17%  of all lymphocytes  -  See comment   COMMENT:  In addition to the clonal B-cell population, there is a myeloblast  population (CD34, CD38, HLA-DR, CD117, CD123 and CD33) that comprises 2% of the total cellular events.  Please see concurrent tissue biopsy (below) for additional work-up and final diagnosis.    FINAL MICROSCOPIC DIAGNOSIS:   A. SOFT TISSUE MASS, PRE SACRAL, NEEDLE CORE BIOPSY:  -  Chronic lymphocytic leukemia/small lymphocytic lymphoma  -  Extra medullary hematopoiesis  -  See comment   COMMENT:  The biopsy consists of multiple soft tissue cores with lymphoid nodules and a dense hematopoietic infiltrate consistent with extra medullary hematopoiesis.  MPO and E-cadherin highlight myeloid and erythroid precursors respectively.  CD34 highlights increased vasculature and is also positive within the cytoplasm of megakaryocytes.  A few small, immature mononuclear cells appear to be positive for CD34 and CD117. TdT shows rare, scattered positive cells.  CD20 highlights aggregates of B cells which are admixed with CD3 positive T cells.  T cells are an admixture of CD4 and CD8.  The B cells are also positive for CD5, CD23 and Bcl-2.  The B cells do not show significant staining for CD10, BCL6 or cyclin D1.  CD138 highlights scattered plasma cells which are polytypic by kappa and lambda in situ hybridization.  Flow cytometry performed on the sample (see WL [id number]) identified a kappa restricted CD5 positive B-cell population comprising 70% of lymphocytes.  In addition, a small myeloblast population comprised 2% of the total cellular  events.   Overall, the findings are consistent with soft tissue involvement by  chronic lymphocytic leukemia/small lymphocytic lymphoma and extra medullary hematopoiesis. In reviewing the patient's CBC data (macrocytic anemia and thrombocytopenia), I would recommend a bone marrow biopsy to assess for marrow involvement by CLL/SLL.    12/23/2021 Imaging   EXAM: CT CHEST, ABDOMEN, AND PELVIS WITH CONTRAST  IMPRESSION: 1. Slight interval enlargement of a presacral soft tissue mass measuring 7.2 x 4.7 cm, previously 6.9 x 4.1 cm on prior MR of the pelvis dated 07/04/2021. By report, this represents a biopsy proven lymphoma. 2. Pleural nodule of the dependent right lower lobe overlying the posterior right tenth rib and pleural or paraspinous soft tissue mass overlying the right aspect of the T10 vertebral body, very slightly enlarged compared to prior examination of the chest dated 06/25/2020, consistent with additional sites of lymphomatous involvement given very indolent growth. These could be better assessed for metabolic activity by FDG PET/CT if desired. 3. There is mild, bibasilar predominant pulmonary fibrosis in a pattern featuring irregular peripheral interstitial opacity, septal thickening, but without clear evidence of subpleural bronchiolectasis or honeycombing, with a somewhat asymmetric distribution most conspicuously involving the right lower lobe and lingula. These findings are significantly worsened when compared to prior examination dated 06/25/2020, particularly in the right lower lobe. Given interval change, this may reflect sequelae of interval infection or aspiration, however appearance is generally suspicious for fibrotic interstitial lung disease, and if characterized by ATS pulmonary fibrosis is in an "indeterminate for UIP" pattern, differential considerations including both UIP and NSIP. 4. Emphysema.   Aortic Atherosclerosis (ICD10-I70.0) and Emphysema  (ICD10-J43.9).  01/05/2022 Pathology Results   DIAGNOSIS:   BONE MARROW, ASPIRATE, CLOT, CORE:  -Hypercellular bone marrow for age with features of  myelodysplastic/myeloproliferative neoplasm  -Minor abnormal B-cell population  -See comment   PERIPHERAL BLOOD:  -Macrocytic anemia  -Neutrophilic left shift and monocytosis  -Thrombocytopenia   COMMENT:  The bone marrow is hypercellular for age with dyspoietic changes  involving myeloid cell lines associated with monocytosis and increased number of blastic cells (12%) as primarily seen by morphology, many of which display monocytic features.  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  In this background, there are several predominantly small lymphoid aggregates mostly composed of small lymphoid cells.  By flow cytometry, a minor abnormal B-cell population expressing CD5 is seen and representing 2% of all cells.  This correlate with previously known B-cell lymphoproliferative process.  Correlation with cytogenetic and FISH studies is strongly recommended.    DIAGNOSIS:   -Increased number of monocytic cells present (25%)  -Minor abnormal B-cell population identified.  -See comment   COMMENT:  Flow cytometric analysis shows increased number of monocytic cells representing 25% of all cells but without aberrant phenotype or CD34 expression.  A significant CD34-positive blastic population is not identified.  The lymphoid population shows a minor B-cell population representing 2% of all cells and expressing B-cell antigens including CD20 associated with CD5, CD200 and possibly dim kappa expression.  The latter findings are abnormal and correlate with previously known B-cell lymphoproliferative process.  No significant T-cell phenotypic abnormalities identified.     01/12/2022 Initial Diagnosis   CMML (chronic myelomonocytic leukemia) ([place])    01/12/2022 Cancer Staging   Staging form: Chronic Myeloid Leukemia, AJCC 8th Edition - Clinical stage from 01/12/2022: Bone marrow blast count (%): 12, Additional clonal changes: Unknown - Signed by Truitt Merle, MD on 01/12/2022 Stage prefix: Initial diagnosis    02/06/2022 -  Chemotherapy   Patient is on Treatment Plan : MYELODYSPLASIA  Azacitidine IV D1-7 q28d         CURRENT THERAPY: Azacitidine x7 days q28 days, s/p cycle 2.  Cycle #3 was held due to prolonged hospitalization for atrial fibrillation, COPD, multifocal pneumonia, bilateral pleural effusions, congestive heart failure, and pancytopenia.   INTERVAL HISTORY: [name] 76 y.o. female returns to the clinic today for follow-up visit.  The patient was last seen in the clinic on 03/27/2022.  Since last being seen, the patient was hospitalized from 03/29/2022 to 04/13/2022 for A-fib with RVR, bilateral pleural effusions, multifocal pneumonia, COPD, and pancytopenia.  She also has congestive heart failure.  She was discharged to a SNF.  From a oncology standpoint, the patient is clinically stable Dr. 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  She receives blood transfusions if her hemoglobin is less than 7.5 and platelets if her platelet count is less than 20,000 and active bleeding.  Still on a blood thinner?  Abnormal bleeding or bruising?  Recent signs and symptoms of infection?  Fevers, chills, night sweats, or unexplained weight loss.  Still in a SNF?   Complaining of rash, bb, ditizem and dignozin can all cause rash. Describes the rash as intense itching with raised, red dots. Has seen PCP and was prescribed Prednisone 47m to take 3 tabs for 2 days, 2 tabs for 2 days, then 1 tablet for 2 days (she is currently on her second day  of the 2 tablets dose) and is not getting relief. Went to [facility] walk-in  clinic 5/13 and they added Cimetadine 297m to take three times daily. Pt still has not had relief. Believes it's one of her medications that's causing it, because she cannot attribute it to any lifestyle change. Educated pt that the Eliquis she was formerly on would not be a replacement for Digoxin as it is factor X inhibitor and the meds she's questioning are antiarrhymics/calcium channel blocker/beta blockers. She may be allergic to one of the three, or something else entirely, but will route to pharmD and covering MD (Harrington Challenger for thoughts. Of note, seen in chart that pt also recently tried a new inhaler called Breztri.  MEDICAL HISTORY: Past Medical History:  Diagnosis Date   Arthritis    Rheumatoid arthritis   Celiac disease    Chronic kidney disease    stage 3 from MD notes   [place]    Dyspnea    with going up stairs   Family history of adverse reaction to anesthesia    father had hard time waking up   Headache    sinus headaches   Hot flashes    Hypertension    Iron deficiency anemia    Pneumonia    per patient "I have walking pneumonia"    ALLERGIES:  is allergic to gluten meal, penicillins, alendronate, and hydroxychloroquine.  MEDICATIONS:  Current Outpatient Medications  Medication Sig Dispense Refill   acetaminophen (TYLENOL) 500 MG tablet Take 1,000 mg by mouth in the morning.     bisacodyl (DULCOLAX) 10 MG suppository Place 1 suppository (10 mg total) rectally daily as needed for moderate constipation. 12 suppository 0   Budeson-Glycopyrrol-Formoterol (BREZTRI AEROSPHERE) 160-9-4.8 MCG/ACT AERO Inhale 2 puffs into the lungs in the morning and at bedtime. (Patient not taking: Reported on 04/28/2022) 10.7 g 0   Calcium-Magnesium-Vitamin D (CALCIUM 1200+D3 PO) Take 1 tablet by mouth daily.     denosumab (PROLIA) 60 MG/ML SOSY injection Inject 60 mg into the skin every 6 (six) months.     digoxin (LANOXIN) 0.125 MG tablet Take 1  tablet (0.125 mg total) by mouth daily. 30 tablet 0   diltiazem (CARDIZEM CD) 360 MG 24 hr capsule Take 1 capsule (360 mg total) by mouth daily. 30 capsule 0   gabapentin (NEURONTIN) 300 MG capsule Take 300 mg by mouth in the morning.     InFLIXimab (REMICADE IV) Inject 10 mg into the vein every 2 (two) months.     lidocaine-prilocaine (EMLA) cream Apply to affected area once 30 g 3   metoprolol succinate (TOPROL-XL) 50 MG 24 hr tablet Take 3.5 tablets (175 mg total) by mouth 2 (two) times daily. 105 tablet 1   predniSONE (DELTASONE) 5 MG tablet Take 5 mg by mouth daily with breakfast.     Tiotropium Bromide-Olodaterol (STIOLTO RESPIMAT) 2.5-2.5 MCG/ACT AERS Inhale 2 puffs into the lungs daily. (Patient not taking: Reported on 04/28/2022) 4 g 11   No current facility-administered medications for this visit.   Facility-Administered Medications Ordered in Other Visits  Medication Dose Route Frequency Provider Last Rate Last Admin   sodium chloride flush (NS) 0.9 % injection 10 mL  10 mL Intracatheter PRN FTruitt Merle MD   10 mL at 03/14/22 1709    SURGICAL HISTORY:  Past Surgical History:  Procedure Laterality Date   COLONOSCOPY     ECTOPIC PREGNANCY SURGERY      x 2   IR  IMAGING GUIDED PORT INSERTION  01/31/2022   REVERSE SHOULDER ARTHROPLASTY Right 02/02/2017   Procedure: RIGHT REVERSE SHOULDER ARTHROPLASTY;  Surgeon: Netta Cedars, MD;  Location: [facility];  Service: Orthopedics;  Laterality: Right;    REVIEW OF SYSTEMS:   Review of Systems  Constitutional: Negative for appetite change, chills, fatigue, fever and unexpected weight change.  HENT:   Negative for mouth sores, nosebleeds, sore throat and trouble swallowing.   Eyes: Negative for eye problems and icterus.  Respiratory: Negative for cough, hemoptysis, shortness of breath and wheezing.   Cardiovascular: Negative for chest pain and leg swelling.  Gastrointestinal: Negative for abdominal pain, constipation, diarrhea, nausea and vomiting.   Genitourinary: Negative for bladder incontinence, difficulty urinating, dysuria, frequency and hematuria.   Musculoskeletal: Negative for back pain, gait problem, neck pain and neck stiffness.  Skin: Negative for itching and rash.  Neurological: Negative for dizziness, extremity weakness, gait problem, headaches, light-headedness and seizures.  Hematological: Negative for adenopathy. Does not bruise/bleed easily.  Psychiatric/Behavioral: Negative for confusion, depression and sleep disturbance. The patient is not nervous/anxious.     PHYSICAL EXAMINATION:  There were no vitals taken for this visit.  ECOG PERFORMANCE STATUS: {CHL ONC ECOG [id number]  Physical Exam  Constitutional: Oriented to person, place, and time and well-developed, well-nourished, and in no distress. No distress.  HENT:  Head: Normocephalic and atraumatic.  Mouth/Throat: Oropharynx is clear and moist. No oropharyngeal exudate.  Eyes: Conjunctivae are normal. Right eye exhibits no discharge. Left eye exhibits no discharge. No scleral icterus.  Neck: Normal range of motion. Neck supple.  Cardiovascular: Normal rate, regular rhythm, normal heart sounds and intact distal pulses.   Pulmonary/Chest: Effort normal and breath sounds normal. No respiratory distress. No wheezes. No rales.  Abdominal: Soft. Bowel sounds are normal. Exhibits no distension and no mass. There is no tenderness.  Musculoskeletal: Normal range of motion. Exhibits no edema.  Lymphadenopathy:    No cervical adenopathy.  Neurological: Alert and oriented to person, place, and time. Exhibits normal muscle tone. Gait normal. Coordination normal.  Skin: Skin is warm and dry. No rash noted. Not diaphoretic. No erythema. No pallor.  Psychiatric: Mood, memory and judgment normal.  Vitals reviewed.  LABORATORY DATA: Lab Results  Component Value Date   WBC 6.6 04/11/2022   HGB 9.7 (L) 04/11/2022   HCT 29.2 (L) 04/11/2022   MCV 102.6 (H) 04/11/2022    PLT 60 (L) 04/11/2022      Chemistry      Component Value Date/Time   NA 136 04/11/2022 0350   NA 141 01/04/2016 1001   K 4.0 04/11/2022 0350   K 4.5 01/04/2016 1001   CL 101 04/11/2022 0350   CO2 28 04/11/2022 0350   CO2 24 01/04/2016 1001   BUN 27 (H) 04/11/2022 0350   BUN 21.7 01/04/2016 1001   CREATININE 0.96 04/11/2022 0350   CREATININE 1.16 (H) 02/09/2022 1445   CREATININE 1.2 (H) 01/04/2016 1001      Component Value Date/Time   CALCIUM 9.6 04/11/2022 0350   CALCIUM 9.7 01/04/2016 1001   ALKPHOS 34 (L) 03/30/2022 0042   ALKPHOS 61 01/04/2016 1001   AST 18 03/30/2022 0042   AST 20 02/09/2022 1445   AST 26 01/04/2016 1001   ALT 14 03/30/2022 0042   ALT 16 02/09/2022 1445   ALT 24 01/04/2016 1001   BILITOT 0.8 03/30/2022 0042   BILITOT 0.6 02/09/2022 1445   BILITOT 0.41 01/04/2016 1001       RADIOGRAPHIC STUDIES:  XR Foot Complete Left  Result Date: 04/21/2022 Radiographs of her foot demonstrate horizontal fracture across the second metatarsal proximal third.  She does have some callus formation about the bone.  Minimally displaced.    ASSESSMENT/PLAN:  MARTIE MUHLBAUER is a 76 y.o. female with    1. 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 SPEP and UPEP with immunofixation were negative. No lab evidence of hemolysis, erythropoietin level is normal.  -Her prior bone marrow biopsy in 2016 was negative except several small lymphoid aggregation, suspicious for low-grade B cell lymphoproliferative process, especially SLL. Her peripheral white count has been normal, no elevated lymphocytes -CT scan from 03/2015 was negative for adenopathy or splenomegaly. no B symptoms  -she has been under observation -She developed worsening back pain in Summer 2022 and underwent work up, pelvic MRI 07/04/21 showed a mass in the presacral space. -bone marrow biopsy on 01/05/22  showed hypercellular marrow with features of myelodysplastic/myeloproliferative neoplasm, increased 12% blasts, most consistent with CMML-2. Her BM biopsy was reviewed at [facility] and was felt to be CMML-1 with 5% blasts. Her previous presacral soft tissue biopsy which showed same CMML as her bone marrow biopsy. -she met Dr. Linus Orn at [facility] on 01/26/22. Vidaza was also recommended  -She began azacitadine daily for 7 days (5+2) every 28 days, starting 02/06/22. She has completed 2 cycles.  -Has pancytopenia *** and low PS. The patient was seen with Dr. Burr Medico today. Hospice***???   -Due to pancytopenia and low PS, Dr. Burr Medico who recommended to postpone C3 by a week and reduce to vidaza 5 days (rather than 7).  -Recently hospitalized due to atrial fibrillation, bilateral pleural effusions, pneumonia, pancytopenia, congestive heart failure.  Discharged to a SNF. -f/u?   2. Symptom management/Comorbitdities (COPD, Pneumonia, CHF, AFIB): abdominal cramps, insomnia, pancytopenia, dyspnea, weakness, low appetite  -She developed abdominal cramps during the first week of vidaza, debilitating at first when not having BMs. -She corrected constipation, cramps have improved but not resolved. -Continue symptom management with Tylenol, heat, and start Bentyl as needed.  Avoid NSAIDs due to thrombocytopenia -She has worsening cytopenias after starting treatment, she received irradiated blood transfusion on 02/21/2022 and 3/27 (blood and plts) -Anemia and thrombocytopenia improved with transfusions, she has persistent pancytopenia. We reviewed neutropenic precautions.  -Pt seen with Dr. Burr Medico who recommended to postpone C3 by a week and reduce to vidaza 5 days (rather than 7).  -Ms. [name] appears deconditioned and weak, secondary to poor nutrition, treatment, and cardiopulmonary symptoms. They previously discussed nutrition, maximizing protein and caloric intake. She is gluten free, does not feel like she has variety of options.   -Lacie previously discussed starting appetite stimulant with mirtazapine. Pt is reluctant due to being on a lot of medication already -Dr. Burr Medico and lacie placed urgent referral to nutritionist.  -her leg edema is also multifactorial, due to poor nutrition and immobility. She does not tolerate tight compression stockings -We discussed repeating echo with cardiology who will reach out to her -She was hospitalized from 03/29/2022 to 04/13/2022.  She was discharged to a SNF.  She was admitted for A-fib with RVR, bilateral pleural effusions, multifocal pneumonia, COPD, pancytopenia, congestive heart failure.  -She was given a blood transfusion while admitted to the hospital.   3. COPD, AF, HTN -on resp meds and eliquis; continue per PCP, cardiology, and pulmonology  - she recently changed inhalers from  stiolto to breztri, her HR and dyspnea issues worsened since then -we spoke to Dr. Valeta Harms today, he doesn't feel strongly this is causing Afib/cardiac issues. OK to switch back to stiolto. His office will call pt to clarify -on 3/27 due to thrombocytopenia plt 22 and active bleeding, I decreased eliquis to 5 mg daily, given that this is subtherapeutic dose, cardiology stopped it 3/31 which is reasonable. Her plt remain <50 -due to her rapid Afib and irregular HR 100 - 140 bpm in clinic, Laurann Montana NP started metoprolol    4. Rheumatoid arthritis -on Remicade q8 weeks per rheumatologist Dr. Trudie Reed. We reviewed the risk of lymphoma and immunosupression on Remicade -Lacie and Dr. Burr Medico previously cc'd my note to Dr. Trudie Reed to consider an alternative given the new diagnosis.  -she continues remidcade at low dose -She takes vitamin supplements and gets prolia injection q48month with her PCP.   5. Rash *** Seeing dermatology. Limited options for her Afib from cardiology stand point.  -BB, diltiazem and dig can all cause rash     PLAN: -labs reviewed, pancytopenic, continue  precautions*** -transfusions *** -postpone Vidaza an extra week and reduce to 5 days*** -F/up in 1 week***    No orders of the defined types were placed in this encounter.    I spent {CHL ONC TIME VISIT - SCXFQH:[id number]}counseling the patient face to face. The total time spent in the appointment was {CHL ONC TIME VISIT - SFPOIP:[id number]}  Lesa Vandall L [name] Loyd, PA-C 05/09/22

## 2022-05-09 NOTE — Telephone Encounter (Signed)
Scheduled appointment per provider request. Patient aware.  ?

## 2022-05-10 ENCOUNTER — Other Ambulatory Visit: Payer: Self-pay

## 2022-05-10 ENCOUNTER — Inpatient Hospital Stay: Payer: Medicare Other | Attending: Nurse Practitioner

## 2022-05-10 ENCOUNTER — Encounter: Payer: Self-pay | Admitting: Nurse Practitioner

## 2022-05-10 ENCOUNTER — Inpatient Hospital Stay (HOSPITAL_BASED_OUTPATIENT_CLINIC_OR_DEPARTMENT_OTHER): Payer: Medicare Other | Admitting: Nurse Practitioner

## 2022-05-10 VITALS — BP 120/50 | HR 67 | Temp 97.8°F | Resp 17 | Wt 119.4 lb

## 2022-05-10 DIAGNOSIS — C9210 Chronic myeloid leukemia, BCR/ABL-positive, not having achieved remission: Secondary | ICD-10-CM | POA: Diagnosis not present

## 2022-05-10 DIAGNOSIS — Z5111 Encounter for antineoplastic chemotherapy: Secondary | ICD-10-CM | POA: Diagnosis not present

## 2022-05-10 DIAGNOSIS — N1831 Chronic kidney disease, stage 3a: Secondary | ICD-10-CM | POA: Diagnosis not present

## 2022-05-10 DIAGNOSIS — I130 Hypertensive heart and chronic kidney disease with heart failure and stage 1 through stage 4 chronic kidney disease, or unspecified chronic kidney disease: Secondary | ICD-10-CM | POA: Diagnosis not present

## 2022-05-10 DIAGNOSIS — C931 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: Secondary | ICD-10-CM | POA: Diagnosis not present

## 2022-05-10 DIAGNOSIS — D508 Other iron deficiency anemias: Secondary | ICD-10-CM | POA: Diagnosis not present

## 2022-05-10 DIAGNOSIS — I5022 Chronic systolic (congestive) heart failure: Secondary | ICD-10-CM | POA: Diagnosis not present

## 2022-05-10 DIAGNOSIS — E440 Moderate protein-calorie malnutrition: Secondary | ICD-10-CM | POA: Diagnosis not present

## 2022-05-10 LAB — CBC WITH DIFFERENTIAL/PLATELET
Abs Immature Granulocytes: 0.28 10*3/uL — ABNORMAL HIGH (ref 0.00–0.07)
Basophils Absolute: 0 10*3/uL (ref 0.0–0.1)
Basophils Relative: 0 %
Eosinophils Absolute: 0.2 10*3/uL (ref 0.0–0.5)
Eosinophils Relative: 2 %
HCT: 25.2 % — ABNORMAL LOW (ref 36.0–46.0)
Hemoglobin: 8.2 g/dL — ABNORMAL LOW (ref 12.0–15.0)
Immature Granulocytes: 2 %
Lymphocytes Relative: 12 %
Lymphs Abs: 1.4 10*3/uL (ref 0.7–4.0)
MCH: 33.7 pg (ref 26.0–34.0)
MCHC: 32.5 g/dL (ref 30.0–36.0)
MCV: 103.7 fL — ABNORMAL HIGH (ref 80.0–100.0)
Monocytes Absolute: 2.3 10*3/uL — ABNORMAL HIGH (ref 0.1–1.0)
Monocytes Relative: 20 %
Neutro Abs: 7.7 10*3/uL (ref 1.7–7.7)
Neutrophils Relative %: 64 %
Platelets: 82 10*3/uL — ABNORMAL LOW (ref 150–400)
RBC: 2.43 MIL/uL — ABNORMAL LOW (ref 3.87–5.11)
RDW: 20.6 % — ABNORMAL HIGH (ref 11.5–15.5)
WBC: 11.9 10*3/uL — ABNORMAL HIGH (ref 4.0–10.5)
nRBC: 1.3 % — ABNORMAL HIGH (ref 0.0–0.2)

## 2022-05-10 LAB — COMPREHENSIVE METABOLIC PANEL
ALT: 23 U/L (ref 0–44)
AST: 19 U/L (ref 15–41)
Albumin: 3.7 g/dL (ref 3.5–5.0)
Alkaline Phosphatase: 49 U/L (ref 38–126)
Anion gap: 11 (ref 5–15)
BUN: 27 mg/dL — ABNORMAL HIGH (ref 8–23)
CO2: 26 mmol/L (ref 22–32)
Calcium: 9.3 mg/dL (ref 8.9–10.3)
Chloride: 101 mmol/L (ref 98–111)
Creatinine, Ser: 1.37 mg/dL — ABNORMAL HIGH (ref 0.44–1.00)
GFR, Estimated: 40 mL/min — ABNORMAL LOW (ref >60)
Glucose, Bld: 132 mg/dL — ABNORMAL HIGH (ref 70–99)
Potassium: 3.7 mmol/L (ref 3.5–5.1)
Sodium: 138 mmol/L (ref 135–145)
Total Bilirubin: 1 mg/dL (ref 0.3–1.2)
Total Protein: 5.8 g/dL — ABNORMAL LOW (ref 6.5–8.1)

## 2022-05-10 LAB — SAMPLE TO BLOOD BANK

## 2022-05-10 NOTE — Progress Notes (Signed)
?[name]   ?[phone] [phone]   ?Clinic Follow up Note  ? ?Patient Care Team: ?[name], MD as PCP - General (Family Medicine) ?[name], Wonda Cheng, MD as PCP - Cardiology (Cardiology) ?Gavin Pound, MD as Consulting Physician (Rheumatology) ?Truitt Merle, MD as Consulting Physician (Hematology) ?Juanita Craver, MD as Consulting Physician (Gastroenterology) ?Garner Nash, DO as Consulting Physician (Pulmonary Disease) ?05/10/2022 ? ?CHIEF COMPLAINT: Follow-up CMML and recent hospitalization ? 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 DIAGNOSIS:  ? ?-  Monoclonal B-cell population with co-expression of CD5 comprises 17%  ?of all lymphocytes  ?-  See comment  ? ?COMMENT:  ?In addition to the clonal B-cell population, there is a myeloblast  ?population (CD34, CD38, HLA-DR, CD117, CD123 and CD33) that comprises 2% of the total cellular events.  Please see concurrent tissue biopsy (below) for additional work-up and final diagnosis.  ? ? ?FINAL MICROSCOPIC DIAGNOSIS:  ? ?A. SOFT TISSUE MASS, PRE SACRAL, NEEDLE CORE BIOPSY:  ?-  Chronic lymphocytic leukemia/small lymphocytic lymphoma  ?-  Extra medullary hematopoiesis  ?-  See comment  ? ?COMMENT:  ?The biopsy consists of multiple soft tissue cores with lymphoid nodules and a dense hematopoietic infiltrate consistent with extra medullary hematopoiesis.  MPO and E-cadherin highlight myeloid and erythroid precursors respectively.  CD34 highlights increased vasculature and is also positive within the cytoplasm of megakaryocytes.  A few small, immature mononuclear cells appear to be positive for CD34 and CD117. TdT shows rare, scattered positive cells.  CD20 highlights aggregates of B cells which are admixed with CD3 positive T cells.  T cells are an admixture of CD4 and CD8.  The B cells are also positive for CD5, CD23 and Bcl-2.  The B cells  do not show significant staining for CD10, BCL6 or cyclin D1.  CD138 highlights scattered plasma cells which are polytypic by kappa and lambda in situ hybridization.  Flow cytometry performed on the sample (see WL [id number]) identified a kappa restricted CD5 positive B-cell population comprising 70% of lymphocytes.  In addition, a small myeloblast population comprised 2% of the total cellular events.  ? ?Overall, the findings are consistent with soft tissue involvement by  ?chronic lymphocytic leukemia/small lymphocytic lymphoma and extra medullary hematopoiesis. In reviewing the patient's CBC data (macrocytic anemia and thrombocytopenia), I would recommend a bone marrow biopsy to assess for marrow involvement by CLL/SLL.  ?  ?12/23/2021 Imaging  ? EXAM: ?CT CHEST, ABDOMEN, AND PELVIS WITH CONTRAST ? ?IMPRESSION: ?1. Slight interval enlargement of a presacral soft tissue mass ?measuring 7.2 x 4.7 cm, previously 6.9 x 4.1 cm on prior MR of the ?pelvis dated 07/04/2021. By report, this represents a biopsy proven ?lymphoma. ?2. Pleural nodule of the dependent right lower lobe overlying the ?posterior right tenth rib and pleural or paraspinous soft tissue ?mass overlying the right aspect of the T10 vertebral body, very ?slightly enlarged compared to prior examination of the chest dated ?06/25/2020, consistent with additional sites of lymphomatous ?involvement given very indolent growth. These could be better ?assessed for metabolic activity by FDG PET/CT if desired. ?3. There is mild, bibasilar predominant pulmonary fibrosis in a ?pattern featuring irregular peripheral interstitial opacity, septal ?thickening, but without clear evidence of subpleural ?bronchiolectasis or honeycombing, with a somewhat asymmetric ?distribution most conspicuously involving the right lower lobe and ?lingula. These findings are significantly worsened when compared to prior examination dated 06/25/2020, particularly in the right lower  lobe.  Given interval change, this may reflect sequelae of interval infection or aspiration, however appearance is generally suspicious for fibrotic interstitial lung disease, and if characterized by ATS pulmonary fibrosis is in an "indeterminate for UIP" pattern, ?differential considerations including both UIP and NSIP. ?4. Emphysema. ?  ?Aortic Atherosclerosis (ICD10-I70.0) and Emphysema (ICD10-J43.9). ?  ?01/05/2022 Pathology Results  ? DIAGNOSIS:  ? 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 ?PERIPHERAL BLOOD:  ?-Macrocytic anemia  ?-Neutrophilic left shift and monocytosis  ?-Thrombocytopenia  ? ?COMMENT:  ?The bone marrow is hypercellular for age with dyspoietic changes  ?involving myeloid cell lines associated with monocytosis and increased number of blastic cells (12%) as primarily seen by morphology, many of which display monocytic features.  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  In this background, there are several predominantly small lymphoid aggregates mostly composed of small lymphoid cells.  By flow cytometry, a minor abnormal B-cell population expressing CD5 is seen and representing 2% of all cells.  This correlate with previously known B-cell lymphoproliferative process.  Correlation with cytogenetic and FISH studies is strongly recommended.  ? ? ?DIAGNOSIS:  ? ?-Increased number of monocytic cells present (25%)  ?-Minor abnormal B-cell population identified.  ?-See comment  ? ?COMMENT:  ?Flow cytometric analysis shows increased number of monocytic cells representing 25% of all cells but without aberrant phenotype or CD34 expression.  A significant CD34-positive blastic population is  not identified.  The lymphoid population shows a minor B-cell population representing 2% of all cells and expressing B-cell antigens including CD20 associated with CD5, CD200 and possibly dim kappa expression.  The latter findings are abnormal and correlate with previously known B-cell lymphoproliferative process.  No significant T-cell phenotypic abnormalities identified.  ?  ?01/12/2022 Initial Diagnosis  ? 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 ?  ?01/12/2022 Cancer Staging  ? Staging form: Chronic Myeloid Leukemia, AJCC 8th Edition ?- Clinical stage from 01/12/2022: Bone marrow blast count (%): 12, Additional clonal changes: Unknown - Signed by Truitt Merle, MD on 01/12/2022 ?Stage prefix: Initial diagnosis ? ?  ?02/06/2022 -  Chemotherapy  ? Patient is on Treatment Plan : MYELODYSPLASIA  Azacitidine IV D1-7 q28d  ? ?   ? ? ?CURRENT THERAPY: Previously on Vidaza x7 days monthly ? ?INTERVAL HISTORY: [name] returns for follow-up as scheduled.  Last chemo was cycle 2 Vidaza on 03/06/2022. Last seen by me 03/27/2022.  She was subsequently hospitalized 03/29/2022 - 04/11/2022 for dyspnea, deconditioning, and A-fib with RVR.  She was found to have pleural effusion and underwent thoracentesis 03/30/2022, cytology was negative for malignant cells.  Bone marrow biopsy 04/04/2022 showed only slightly decreased blasts.  Lumbar MRI showed stable presacral soft tissue mass.  Heart rate controlled with addition of digoxin and cardizem.  She was discharged from rehab after 16 days.  She could do all the exercises at home.  She feels stronger, fixes meals and goes to the bathroom by herself.  She ambulates with a cane, denies fall.  Can ambulate from room to room at home without much dyspnea.  In the last 4-5 days she does notice some if she exerts herself a lot but "not like before", no cough or chest pain.  Legs swell if she is on her feet but this resolves when supine.  Her appetite is slightly low, not drinking that well.   Abdominal cramping is chronic, she does not notice it much lately.  Bowels moving well.  Denies bleeding.  She is on prednisone taper for a rash reaction to something she received in the hospital.  She will

## 2022-05-11 ENCOUNTER — Inpatient Hospital Stay: Payer: Medicare Other

## 2022-05-11 ENCOUNTER — Inpatient Hospital Stay: Payer: Medicare Other | Admitting: Physician Assistant

## 2022-05-11 ENCOUNTER — Other Ambulatory Visit: Payer: Self-pay | Admitting: Hematology

## 2022-05-15 ENCOUNTER — Inpatient Hospital Stay: Payer: Medicare Other

## 2022-05-15 ENCOUNTER — Other Ambulatory Visit: Payer: Self-pay

## 2022-05-15 VITALS — BP 113/44 | HR 65 | Temp 97.9°F | Resp 20 | Wt 120.8 lb

## 2022-05-15 DIAGNOSIS — L853 Xerosis cutis: Secondary | ICD-10-CM | POA: Diagnosis not present

## 2022-05-15 DIAGNOSIS — C931 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: Secondary | ICD-10-CM

## 2022-05-15 DIAGNOSIS — L309 Dermatitis, unspecified: Secondary | ICD-10-CM | POA: Diagnosis not present

## 2022-05-15 DIAGNOSIS — D1801 Hemangioma of skin and subcutaneous tissue: Secondary | ICD-10-CM | POA: Diagnosis not present

## 2022-05-15 DIAGNOSIS — L821 Other seborrheic keratosis: Secondary | ICD-10-CM | POA: Diagnosis not present

## 2022-05-15 DIAGNOSIS — D225 Melanocytic nevi of trunk: Secondary | ICD-10-CM | POA: Diagnosis not present

## 2022-05-15 DIAGNOSIS — Z5111 Encounter for antineoplastic chemotherapy: Secondary | ICD-10-CM | POA: Diagnosis not present

## 2022-05-15 LAB — COMPREHENSIVE METABOLIC PANEL
ALT: 19 U/L (ref 0–44)
AST: 16 U/L (ref 15–41)
Albumin: 3.5 g/dL (ref 3.5–5.0)
Alkaline Phosphatase: 42 U/L (ref 38–126)
Anion gap: 6 (ref 5–15)
BUN: 25 mg/dL — ABNORMAL HIGH (ref 8–23)
CO2: 30 mmol/L (ref 22–32)
Calcium: 9 mg/dL (ref 8.9–10.3)
Chloride: 103 mmol/L (ref 98–111)
Creatinine, Ser: 1.18 mg/dL — ABNORMAL HIGH (ref 0.44–1.00)
GFR, Estimated: 48 mL/min — ABNORMAL LOW (ref >60)
Glucose, Bld: 116 mg/dL — ABNORMAL HIGH (ref 70–99)
Potassium: 3.9 mmol/L (ref 3.5–5.1)
Sodium: 139 mmol/L (ref 135–145)
Total Bilirubin: 0.9 mg/dL (ref 0.3–1.2)
Total Protein: 5.6 g/dL — ABNORMAL LOW (ref 6.5–8.1)

## 2022-05-15 LAB — CBC WITH DIFFERENTIAL/PLATELET
Abs Immature Granulocytes: 0.24 10*3/uL — ABNORMAL HIGH (ref 0.00–0.07)
Basophils Absolute: 0 10*3/uL (ref 0.0–0.1)
Basophils Relative: 0 %
Eosinophils Absolute: 0.1 10*3/uL (ref 0.0–0.5)
Eosinophils Relative: 1 %
HCT: 24.7 % — ABNORMAL LOW (ref 36.0–46.0)
Hemoglobin: 8 g/dL — ABNORMAL LOW (ref 12.0–15.0)
Immature Granulocytes: 2 %
Lymphocytes Relative: 29 %
Lymphs Abs: 3.2 10*3/uL (ref 0.7–4.0)
MCH: 34.2 pg — ABNORMAL HIGH (ref 26.0–34.0)
MCHC: 32.4 g/dL (ref 30.0–36.0)
MCV: 105.6 fL — ABNORMAL HIGH (ref 80.0–100.0)
Monocytes Absolute: 4.2 10*3/uL — ABNORMAL HIGH (ref 0.1–1.0)
Monocytes Relative: 38 %
Neutro Abs: 3.3 10*3/uL (ref 1.7–7.7)
Neutrophils Relative %: 30 %
Platelets: 69 10*3/uL — ABNORMAL LOW (ref 150–400)
RBC: 2.34 MIL/uL — ABNORMAL LOW (ref 3.87–5.11)
RDW: 22.6 % — ABNORMAL HIGH (ref 11.5–15.5)
Smear Review: NORMAL
WBC: 11.1 10*3/uL — ABNORMAL HIGH (ref 4.0–10.5)
nRBC: 0.8 % — ABNORMAL HIGH (ref 0.0–0.2)

## 2022-05-15 MED ORDER — SODIUM CHLORIDE 0.9 % IV SOLN
10.0000 mg | Freq: Once | INTRAVENOUS | Status: AC
  Administered 2022-05-15: 10 mg via INTRAVENOUS
  Filled 2022-05-15: qty 10

## 2022-05-15 MED ORDER — SODIUM CHLORIDE 0.9 % IV SOLN: Freq: Once | INTRAVENOUS | Status: AC

## 2022-05-15 MED ORDER — SODIUM CHLORIDE 0.9% FLUSH
10.0000 mL | INTRAVENOUS | Status: DC | PRN
  Administered 2022-05-15: 10 mL

## 2022-05-15 MED ORDER — SODIUM CHLORIDE 0.9 % IV SOLN
75.0000 mg/m2 | Freq: Once | INTRAVENOUS | Status: AC
  Administered 2022-05-15: 120 mg via INTRAVENOUS
  Filled 2022-05-15: qty 12

## 2022-05-15 MED ORDER — PALONOSETRON HCL INJECTION 0.25 MG/5ML
0.2500 mg | Freq: Once | INTRAVENOUS | Status: AC
  Administered 2022-05-15: 0.25 mg via INTRAVENOUS
  Filled 2022-05-15: qty 5

## 2022-05-15 MED ORDER — HEPARIN SOD (PORK) LOCK FLUSH 100 UNIT/ML IV SOLN
500.0000 [IU] | Freq: Once | INTRAVENOUS | Status: AC | PRN
  Administered 2022-05-15: 500 [IU]

## 2022-05-15 MED FILL — Dexamethasone Sodium Phosphate Inj 100 MG/10ML: INTRAMUSCULAR | Qty: 1 | Status: AC

## 2022-05-15 NOTE — Progress Notes (Signed)
Per Dr. Burr Medico, ok for treatment today with Platelets 69 K/uL.

## 2022-05-15 NOTE — Patient Instructions (Signed)
[facility] MEDICAL [facility]COLOG[facility]  Discharge Instructions: Thank you for choosing [facility] to provide your oncology and hematology care.   If you have a lab appointment with the Cancer Center, please go directly to the Cancer Center and check in at the registration area.   Wear comfortable clothing and clothing appropriate for easy access to any Portacath or PICC line.   We strive to give you quality time with your provider. You may need to reschedule your appointment if you arrive late (15 or more minutes).  Arriving late affects you and other patients whose appointments are after yours.  Also, if you miss three or more appointments without notifying the office, you may be dismissed from the clinic at the provider's discretion.      For prescription refill requests, have your pharmacy contact our office and allow 72 hours for refills to be completed.    Today you received the following chemotherapy and/or immunotherapy agent: Azacitidine (Vidaza)      To help prevent nausea and vomiting after your treatment, we encourage you to take your nausea medication as directed.  BELOW ARE SYMPTOMS THAT SHOULD BE REPORTED IMMEDIATELY: . *FEVER GREATER THAN 100.4 F (38 C) OR HIGHER . *CHILLS OR SWEATING . *NAUSEA AND VOMITING THAT IS NOT CONTROLLED WITH YOUR NAUSEA MEDICATION . *UNUSUAL SHORTNESS OF BREATH . *UNUSUAL BRUISING OR BLEEDING . *URINARY PROBLEMS (pain or burning when urinating, or frequent urination) . *BOWEL PROBLEMS (unusual diarrhea, constipation, pain near the anus) . TENDERNESS IN MOUTH AND THROAT WITH OR WITHOUT PRESENCE OF ULCERS (sore throat, sores in mouth, or a toothache) . UNUSUAL RASH, SWELLING OR PAIN  . UNUSUAL VAGINAL DISCHARGE OR ITCHING   Items with * indicate a potential emergency and should be followed up as soon as possible or go to the Emergency Department if any problems should occur.  Please show the CHEMOTHERAPY ALERT CARD or  IMMUNOTHERAPY ALERT CARD at check-in to the Emergency Department and triage nurse.  Should you have questions after your visit or need to cancel or reschedule your appointment, please contact [facility]  Dept: [phone]  and follow the prompts.  Office hours are 8:00 a.m. to 4:30 p.m. Monday - Friday. Please note that voicemails left after 4:00 p.m. may not be returned until the following business day.  We are closed weekends and major holidays. You have access to a nurse at all times for urgent questions. Please call the main number to the clinic Dept: [phone] and follow the prompts.   For any non-urgent questions, you may also contact your provider using MyChart. We now offer e-Visits for anyone 18 and older to request care online for non-urgent symptoms. For details visit [email or web address].   Also download the MyChart app! Go to the app store, search "MyChart", open the app, select [facility], and log in with your MyChart username and password.  Due to Covid, a mask is required upon entering the hospital/clinic. If you do not have a mask, one will be given to you upon arrival. For doctor visits, patients may have 1 support person aged 18 or older with them. For treatment visits, patients cannot have anyone with them due to current Covid guidelines and our immunocompromised population.   

## 2022-05-16 ENCOUNTER — Telehealth: Payer: Self-pay | Admitting: Hematology

## 2022-05-16 ENCOUNTER — Inpatient Hospital Stay: Payer: Medicare Other

## 2022-05-16 ENCOUNTER — Encounter: Payer: Self-pay | Admitting: Cardiovascular Disease

## 2022-05-16 VITALS — BP 106/86 | HR 76 | Temp 97.9°F | Resp 17

## 2022-05-16 DIAGNOSIS — C931 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: Secondary | ICD-10-CM | POA: Diagnosis present

## 2022-05-16 DIAGNOSIS — Z5111 Encounter for antineoplastic chemotherapy: Secondary | ICD-10-CM | POA: Diagnosis present

## 2022-05-16 DIAGNOSIS — I130 Hypertensive heart and chronic kidney disease with heart failure and stage 1 through stage 4 chronic kidney disease, or unspecified chronic kidney disease: Secondary | ICD-10-CM | POA: Diagnosis not present

## 2022-05-16 DIAGNOSIS — D508 Other iron deficiency anemias: Secondary | ICD-10-CM | POA: Diagnosis not present

## 2022-05-16 DIAGNOSIS — I5022 Chronic systolic (congestive) heart failure: Secondary | ICD-10-CM | POA: Diagnosis not present

## 2022-05-16 DIAGNOSIS — C9210 Chronic myeloid leukemia, BCR/ABL-positive, not having achieved remission: Secondary | ICD-10-CM | POA: Diagnosis not present

## 2022-05-16 DIAGNOSIS — E440 Moderate protein-calorie malnutrition: Secondary | ICD-10-CM | POA: Diagnosis not present

## 2022-05-16 DIAGNOSIS — N1831 Chronic kidney disease, stage 3a: Secondary | ICD-10-CM | POA: Diagnosis not present

## 2022-05-16 MED ORDER — HEPARIN SOD (PORK) LOCK FLUSH 100 UNIT/ML IV SOLN
500.0000 [IU] | Freq: Once | INTRAVENOUS | Status: AC | PRN
  Administered 2022-05-16: 500 [IU]

## 2022-05-16 MED ORDER — SODIUM CHLORIDE 0.9% FLUSH
10.0000 mL | INTRAVENOUS | Status: DC | PRN
  Administered 2022-05-16: 10 mL

## 2022-05-16 MED ORDER — SODIUM CHLORIDE 0.9 % IV SOLN
10.0000 mg | Freq: Once | INTRAVENOUS | Status: AC
  Administered 2022-05-16: 10 mg via INTRAVENOUS
  Filled 2022-05-16: qty 10

## 2022-05-16 MED ORDER — SODIUM CHLORIDE 0.9 % IV SOLN
75.0000 mg/m2 | Freq: Once | INTRAVENOUS | Status: AC
  Administered 2022-05-16: 120 mg via INTRAVENOUS
  Filled 2022-05-16: qty 12

## 2022-05-16 MED ORDER — SODIUM CHLORIDE 0.9 % IV SOLN: Freq: Once | INTRAVENOUS | Status: AC

## 2022-05-16 MED FILL — Dexamethasone Sodium Phosphate Inj 100 MG/10ML: INTRAMUSCULAR | Qty: 1 | Status: AC

## 2022-05-16 NOTE — Telephone Encounter (Signed)
Left detailed message on home answering machine and sent MyChart message as well. Recommendation is to stop the last med started, which appears to be Digoxin. Awaiting confirmation from patient.   Marcelle Overlie D, RPH-CPP  You 1 hour ago (3:00 PM)   She can just stop digoxin. Should keep eye on heart rate.     You  Cv Div Pharmd 2 hours ago (2:31 PM)   Are there any recommendations that pt should follow when stopping digoxin? Trying to determine cause of rash, but is abrupt removal okay/safe?

## 2022-05-16 NOTE — Telephone Encounter (Signed)
Scheduled follow-up appointments per 5/17 los. Patient is aware.

## 2022-05-16 NOTE — Telephone Encounter (Signed)
I am covering for Dr Acie Fredrickson  Need to stop drug that is likely culprit for rash   Per Hughie Closs, digoxin was last med added Please confirm with patient   If that is then I would stop   Follow up with response   Shold get better   If doesn't then need to assess other meds

## 2022-05-16 NOTE — Patient Instructions (Signed)
[facility]ICAL [facility]COLOG[facility]  Discharge Instructions: Thank you for choosing [facility] to provide your oncology and hematology care.   If you have a lab appointment with the Cancer Center, please go directly to the Cancer Center and check in at the registration area.   Wear comfortable clothing and clothing appropriate for easy access to any Portacath or PICC line.   We strive to give you quality time with your provider. You may need to reschedule your appointment if you arrive late (15 or more minutes).  Arriving late affects you and other patients whose appointments are after yours.  Also, if you miss three or more appointments without notifying the office, you may be dismissed from the clinic at the provider's discretion.      For prescription refill requests, have your pharmacy contact our office and allow 72 hours for refills to be completed.   Today you received the following chemotherapy and/or immunotherapy agents Vidaza     To help prevent nausea and vomiting after your treatment, we encourage you to take your nausea medication as directed.  BELOW ARE SYMPTOMS THAT SHOULD BE REPORTED IMMEDIATELY: *FEVER GREATER THAN 100.4 F (38 C) OR HIGHER *CHILLS OR SWEATING *NAUSEA AND VOMITING THAT IS NOT CONTROLLED WITH YOUR NAUSEA MEDICATION *UNUSUAL SHORTNESS OF BREATH *UNUSUAL BRUISING OR BLEEDING *URINARY PROBLEMS (pain or burning when urinating, or frequent urination) *BOWEL PROBLEMS (unusual diarrhea, constipation, pain near the anus) TENDERNESS IN MOUTH AND THROAT WITH OR WITHOUT PRESENCE OF ULCERS (sore throat, sores in mouth, or a toothache) UNUSUAL RASH, SWELLING OR PAIN  UNUSUAL VAGINAL DISCHARGE OR ITCHING   Items with * indicate a potential emergency and should be followed up as soon as possible or go to the Emergency Department if any problems should occur.  Please show the CHEMOTHERAPY ALERT CARD or IMMUNOTHERAPY ALERT CARD at check-in to the  Emergency Department and triage nurse.  Should you have questions after your visit or need to cancel or reschedule your appointment, please contact [facility]  Dept: [phone]  and follow the prompts.  Office hours are 8:00 a.m. to 4:30 p.m. Monday - Friday. Please note that voicemails left after 4:00 p.m. may not be returned until the following business day.  We are closed weekends and major holidays. You have access to a nurse at all times for urgent questions. Please call the main number to the clinic Dept: [phone] and follow the prompts.   For any non-urgent questions, you may also contact your provider using MyChart. We now offer e-Visits for anyone 18 and older to request care online for non-urgent symptoms. For details visit [email or web address].   Also download the MyChart app! Go to the app store, search "MyChart", open the app, select [facility], and log in with your MyChart username and password.  Due to Covid, a mask is required upon entering the hospital/clinic. If you do not have a mask, one will be given to you upon arrival. For doctor visits, patients may have 1 support person aged 18 or older with them. For treatment visits, patients cannot have anyone with them due to current Covid guidelines and our immunocompromised population.   

## 2022-05-17 ENCOUNTER — Inpatient Hospital Stay: Payer: Medicare Other

## 2022-05-17 ENCOUNTER — Encounter: Payer: Self-pay | Admitting: Hematology

## 2022-05-17 ENCOUNTER — Other Ambulatory Visit: Payer: Self-pay

## 2022-05-17 DIAGNOSIS — C9210 Chronic myeloid leukemia, BCR/ABL-positive, not having achieved remission: Secondary | ICD-10-CM | POA: Diagnosis not present

## 2022-05-17 DIAGNOSIS — R112 Nausea with vomiting, unspecified: Secondary | ICD-10-CM

## 2022-05-17 DIAGNOSIS — I130 Hypertensive heart and chronic kidney disease with heart failure and stage 1 through stage 4 chronic kidney disease, or unspecified chronic kidney disease: Secondary | ICD-10-CM | POA: Diagnosis not present

## 2022-05-17 DIAGNOSIS — I5022 Chronic systolic (congestive) heart failure: Secondary | ICD-10-CM | POA: Diagnosis not present

## 2022-05-17 DIAGNOSIS — E440 Moderate protein-calorie malnutrition: Secondary | ICD-10-CM | POA: Diagnosis not present

## 2022-05-17 DIAGNOSIS — D508 Other iron deficiency anemias: Secondary | ICD-10-CM | POA: Diagnosis not present

## 2022-05-17 DIAGNOSIS — N1831 Chronic kidney disease, stage 3a: Secondary | ICD-10-CM | POA: Diagnosis not present

## 2022-05-17 MED ORDER — ONDANSETRON HCL 8 MG PO TABS: 8.0000 mg | ORAL_TABLET | Freq: Three times a day (TID) | ORAL | 1 refills | Status: DC | PRN

## 2022-05-17 MED FILL — Dexamethasone Sodium Phosphate Inj 100 MG/10ML: INTRAMUSCULAR | Qty: 1 | Status: AC

## 2022-05-18 ENCOUNTER — Inpatient Hospital Stay: Payer: Medicare Other

## 2022-05-18 VITALS — BP 142/60 | HR 65 | Temp 97.9°F | Resp 18

## 2022-05-18 DIAGNOSIS — C931 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: Secondary | ICD-10-CM

## 2022-05-18 DIAGNOSIS — Z5111 Encounter for antineoplastic chemotherapy: Secondary | ICD-10-CM | POA: Diagnosis not present

## 2022-05-18 MED ORDER — SODIUM CHLORIDE 0.9 % IV SOLN
10.0000 mg | Freq: Once | INTRAVENOUS | Status: AC
  Administered 2022-05-18: 10 mg via INTRAVENOUS
  Filled 2022-05-18: qty 1

## 2022-05-18 MED ORDER — SODIUM CHLORIDE 0.9% FLUSH
10.0000 mL | INTRAVENOUS | Status: DC | PRN
  Administered 2022-05-18: 10 mL

## 2022-05-18 MED ORDER — HEPARIN SOD (PORK) LOCK FLUSH 100 UNIT/ML IV SOLN
500.0000 [IU] | Freq: Once | INTRAVENOUS | Status: AC | PRN
  Administered 2022-05-18: 500 [IU]

## 2022-05-18 MED ORDER — SODIUM CHLORIDE 0.9 % IV SOLN: Freq: Once | INTRAVENOUS | Status: AC

## 2022-05-18 MED ORDER — SODIUM CHLORIDE 0.9 % IV SOLN
75.0000 mg/m2 | Freq: Once | INTRAVENOUS | Status: AC
  Administered 2022-05-18: 120 mg via INTRAVENOUS
  Filled 2022-05-18: qty 12

## 2022-05-18 MED ORDER — PALONOSETRON HCL INJECTION 0.25 MG/5ML
0.2500 mg | Freq: Once | INTRAVENOUS | Status: AC
  Administered 2022-05-18: 0.25 mg via INTRAVENOUS
  Filled 2022-05-18: qty 5

## 2022-05-18 MED FILL — Dexamethasone Sodium Phosphate Inj 100 MG/10ML: INTRAMUSCULAR | Qty: 1 | Status: AC

## 2022-05-18 NOTE — Patient Instructions (Signed)
[facility]COL[facility]  Discharge Instructions: Thank you for choosing [facility] to provide your oncology and hematology care.   If you have a lab appointment with the Cancer Center, please go directly to the Cancer Center and check in at the registration area.   Wear comfortable clothing and clothing appropriate for easy access to any Portacath or PICC line.   We strive to give you quality time with your provider. You may need to reschedule your appointment if you arrive late (15 or more minutes).  Arriving late affects you and other patients whose appointments are after yours.  Also, if you miss three or more appointments without notifying the office, you may be dismissed from the clinic at the provider's discretion.      For prescription refill requests, have your pharmacy contact our office and allow 72 hours for refills to be completed.   Today you received the following chemotherapy and/or immunotherapy agents Vidaza     To help prevent nausea and vomiting after your treatment, we encourage you to take your nausea medication as directed.  BELOW ARE SYMPTOMS THAT SHOULD BE REPORTED IMMEDIATELY: *FEVER GREATER THAN 100.4 F (38 C) OR HIGHER *CHILLS OR SWEATING *NAUSEA AND VOMITING THAT IS NOT CONTROLLED WITH YOUR NAUSEA MEDICATION *UNUSUAL SHORTNESS OF BREATH *UNUSUAL BRUISING OR BLEEDING *URINARY PROBLEMS (pain or burning when urinating, or frequent urination) *BOWEL PROBLEMS (unusual diarrhea, constipation, pain near the anus) TENDERNESS IN MOUTH AND THROAT WITH OR WITHOUT PRESENCE OF ULCERS (sore throat, sores in mouth, or a toothache) UNUSUAL RASH, SWELLING OR PAIN  UNUSUAL VAGINAL DISCHARGE OR ITCHING   Items with * indicate a potential emergency and should be followed up as soon as possible or go to the Emergency Department if any problems should occur.  Please show the CHEMOTHERAPY ALERT CARD or IMMUNOTHERAPY ALERT CARD at check-in to the  Emergency Department and triage nurse.  Should you have questions after your visit or need to cancel or reschedule your appointment, please contact [facility]  Dept: [phone]  and follow the prompts.  Office hours are 8:00 a.m. to 4:30 p.m. Monday - Friday. Please note that voicemails left after 4:00 p.m. may not be returned until the following business day.  We are closed weekends and major holidays. You have access to a nurse at all times for urgent questions. Please call the main number to the clinic Dept: [phone] and follow the prompts.   For any non-urgent questions, you may also contact your provider using MyChart. We now offer e-Visits for anyone 18 and older to request care online for non-urgent symptoms. For details visit [email or web address].   Also download the MyChart app! Go to the app store, search "MyChart", open the app, select [facility], and log in with your MyChart username and password.  Due to Covid, a mask is required upon entering the hospital/clinic. If you do not have a mask, one will be given to you upon arrival. For doctor visits, patients may have 1 support person aged 18 or older with them. For treatment visits, patients cannot have anyone with them due to current Covid guidelines and our immunocompromised population.   

## 2022-05-19 ENCOUNTER — Inpatient Hospital Stay: Payer: Medicare Other

## 2022-05-19 ENCOUNTER — Encounter: Payer: Self-pay | Admitting: Pulmonary Disease

## 2022-05-19 ENCOUNTER — Encounter (HOSPITAL_BASED_OUTPATIENT_CLINIC_OR_DEPARTMENT_OTHER): Payer: Self-pay

## 2022-05-19 ENCOUNTER — Emergency Department (HOSPITAL_BASED_OUTPATIENT_CLINIC_OR_DEPARTMENT_OTHER): Payer: Medicare Other | Admitting: Radiology

## 2022-05-19 ENCOUNTER — Other Ambulatory Visit: Payer: Self-pay

## 2022-05-19 ENCOUNTER — Inpatient Hospital Stay (HOSPITAL_BASED_OUTPATIENT_CLINIC_OR_DEPARTMENT_OTHER)
Admission: EM | Admit: 2022-05-19 | Discharge: 2022-05-21 | DRG: 291 | Disposition: A | Discharge status: Home Health | Payer: Medicare Other | Attending: Internal Medicine | Admitting: Internal Medicine

## 2022-05-19 VITALS — BP 139/61 | HR 108 | Resp 18

## 2022-05-19 DIAGNOSIS — I130 Hypertensive heart and chronic kidney disease with heart failure and stage 1 through stage 4 chronic kidney disease, or unspecified chronic kidney disease: Principal | ICD-10-CM | POA: Diagnosis present

## 2022-05-19 DIAGNOSIS — D696 Thrombocytopenia, unspecified: Secondary | ICD-10-CM | POA: Diagnosis present

## 2022-05-19 DIAGNOSIS — C931 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: Secondary | ICD-10-CM

## 2022-05-19 DIAGNOSIS — J811 Chronic pulmonary edema: Secondary | ICD-10-CM | POA: Diagnosis not present

## 2022-05-19 DIAGNOSIS — J9811 Atelectasis: Secondary | ICD-10-CM | POA: Diagnosis not present

## 2022-05-19 DIAGNOSIS — I480 Paroxysmal atrial fibrillation: Secondary | ICD-10-CM | POA: Diagnosis not present

## 2022-05-19 DIAGNOSIS — J9601 Acute respiratory failure with hypoxia: Secondary | ICD-10-CM | POA: Diagnosis not present

## 2022-05-19 DIAGNOSIS — E876 Hypokalemia: Secondary | ICD-10-CM | POA: Diagnosis present

## 2022-05-19 DIAGNOSIS — D638 Anemia in other chronic diseases classified elsewhere: Secondary | ICD-10-CM | POA: Diagnosis not present

## 2022-05-19 DIAGNOSIS — J449 Chronic obstructive pulmonary disease, unspecified: Secondary | ICD-10-CM | POA: Diagnosis not present

## 2022-05-19 DIAGNOSIS — Z91018 Allergy to other foods: Secondary | ICD-10-CM | POA: Diagnosis not present

## 2022-05-19 DIAGNOSIS — Z87891 Personal history of nicotine dependence: Secondary | ICD-10-CM

## 2022-05-19 DIAGNOSIS — D631 Anemia in chronic kidney disease: Secondary | ICD-10-CM | POA: Diagnosis present

## 2022-05-19 DIAGNOSIS — Z7952 Long term (current) use of systemic steroids: Secondary | ICD-10-CM | POA: Diagnosis not present

## 2022-05-19 DIAGNOSIS — M069 Rheumatoid arthritis, unspecified: Secondary | ICD-10-CM | POA: Diagnosis present

## 2022-05-19 DIAGNOSIS — I4891 Unspecified atrial fibrillation: Secondary | ICD-10-CM | POA: Diagnosis present

## 2022-05-19 DIAGNOSIS — M1991 Primary osteoarthritis, unspecified site: Secondary | ICD-10-CM | POA: Diagnosis present

## 2022-05-19 DIAGNOSIS — J90 Pleural effusion, not elsewhere classified: Secondary | ICD-10-CM | POA: Diagnosis not present

## 2022-05-19 DIAGNOSIS — R9431 Abnormal electrocardiogram [ECG] [EKG]: Secondary | ICD-10-CM | POA: Diagnosis not present

## 2022-05-19 DIAGNOSIS — N183 Chronic kidney disease, stage 3 unspecified: Secondary | ICD-10-CM | POA: Diagnosis present

## 2022-05-19 DIAGNOSIS — R0602 Shortness of breath: Secondary | ICD-10-CM | POA: Diagnosis not present

## 2022-05-19 DIAGNOSIS — I5033 Acute on chronic diastolic (congestive) heart failure: Secondary | ICD-10-CM | POA: Diagnosis not present

## 2022-05-19 DIAGNOSIS — Z79899 Other long term (current) drug therapy: Secondary | ICD-10-CM

## 2022-05-19 DIAGNOSIS — Z96611 Presence of right artificial shoulder joint: Secondary | ICD-10-CM | POA: Diagnosis not present

## 2022-05-19 DIAGNOSIS — Z888 Allergy status to other drugs, medicaments and biological substances status: Secondary | ICD-10-CM | POA: Diagnosis not present

## 2022-05-19 DIAGNOSIS — I5022 Chronic systolic (congestive) heart failure: Secondary | ICD-10-CM | POA: Diagnosis present

## 2022-05-19 DIAGNOSIS — Z880 Allergy status to penicillin: Secondary | ICD-10-CM | POA: Diagnosis not present

## 2022-05-19 DIAGNOSIS — I5023 Acute on chronic systolic (congestive) heart failure: Secondary | ICD-10-CM | POA: Diagnosis present

## 2022-05-19 LAB — CBC WITH DIFFERENTIAL/PLATELET
Abs Immature Granulocytes: 0.1 10*3/uL — ABNORMAL HIGH (ref 0.00–0.07)
Basophils Absolute: 0 10*3/uL (ref 0.0–0.1)
Basophils Relative: 0 %
Eosinophils Absolute: 0 10*3/uL (ref 0.0–0.5)
Eosinophils Relative: 0 %
HCT: 22.7 % — ABNORMAL LOW (ref 36.0–46.0)
Hemoglobin: 7.4 g/dL — ABNORMAL LOW (ref 12.0–15.0)
Immature Granulocytes: 1 %
Lymphocytes Relative: 9 %
Lymphs Abs: 0.8 10*3/uL (ref 0.7–4.0)
MCH: 33.9 pg (ref 26.0–34.0)
MCHC: 32.6 g/dL (ref 30.0–36.0)
MCV: 104.1 fL — ABNORMAL HIGH (ref 80.0–100.0)
Monocytes Absolute: 0.6 10*3/uL (ref 0.1–1.0)
Monocytes Relative: 7 %
Neutro Abs: 7 10*3/uL (ref 1.7–7.7)
Neutrophils Relative %: 83 %
Platelets: 82 10*3/uL — ABNORMAL LOW (ref 150–400)
RBC: 2.18 MIL/uL — ABNORMAL LOW (ref 3.87–5.11)
RDW: 21.5 % — ABNORMAL HIGH (ref 11.5–15.5)
WBC: 8.5 10*3/uL (ref 4.0–10.5)
nRBC: 0.6 % — ABNORMAL HIGH (ref 0.0–0.2)

## 2022-05-19 LAB — COMPREHENSIVE METABOLIC PANEL
ALT: 20 U/L (ref 0–44)
AST: 14 U/L — ABNORMAL LOW (ref 15–41)
Albumin: 3.9 g/dL (ref 3.5–5.0)
Alkaline Phosphatase: 38 U/L (ref 38–126)
Anion gap: 12 (ref 5–15)
BUN: 34 mg/dL — ABNORMAL HIGH (ref 8–23)
CO2: 24 mmol/L (ref 22–32)
Calcium: 9.1 mg/dL (ref 8.9–10.3)
Chloride: 100 mmol/L (ref 98–111)
Creatinine, Ser: 0.96 mg/dL (ref 0.44–1.00)
GFR, Estimated: >60 mL/min (ref >60)
Glucose, Bld: 162 mg/dL — ABNORMAL HIGH (ref 70–99)
Potassium: 3.9 mmol/L (ref 3.5–5.1)
Sodium: 136 mmol/L (ref 135–145)
Total Bilirubin: 1.2 mg/dL (ref 0.3–1.2)
Total Protein: 5.9 g/dL — ABNORMAL LOW (ref 6.5–8.1)

## 2022-05-19 LAB — BRAIN NATRIURETIC PEPTIDE: B Natriuretic Peptide: 762.2 pg/mL — ABNORMAL HIGH (ref 0.0–100.0)

## 2022-05-19 LAB — TROPONIN I (HIGH SENSITIVITY)
Troponin I (High Sensitivity): 19 ng/L — ABNORMAL HIGH (ref <18)
Troponin I (High Sensitivity): 19 ng/L — ABNORMAL HIGH (ref <18)

## 2022-05-19 MED ORDER — SODIUM CHLORIDE 0.9% FLUSH
10.0000 mL | INTRAVENOUS | Status: DC | PRN
  Administered 2022-05-19: 10 mL

## 2022-05-19 MED ORDER — FUROSEMIDE 10 MG/ML IJ SOLN
40.0000 mg | Freq: Once | INTRAMUSCULAR | Status: AC
  Administered 2022-05-19: 40 mg via INTRAVENOUS
  Filled 2022-05-19: qty 4

## 2022-05-19 MED ORDER — ACETAMINOPHEN 325 MG PO TABS
650.0000 mg | ORAL_TABLET | Freq: Once | ORAL | Status: AC
  Administered 2022-05-19: 650 mg via ORAL
  Filled 2022-05-19: qty 2

## 2022-05-19 MED ORDER — HEPARIN SOD (PORK) LOCK FLUSH 100 UNIT/ML IV SOLN
500.0000 [IU] | Freq: Once | INTRAVENOUS | Status: AC | PRN
  Administered 2022-05-19: 500 [IU]

## 2022-05-19 MED ORDER — METOPROLOL SUCCINATE ER 50 MG PO TB24
150.0000 mg | ORAL_TABLET | Freq: Two times a day (BID) | ORAL | Status: DC
  Administered 2022-05-19 – 2022-05-21 (×4): 150 mg via ORAL
  Filled 2022-05-19 (×4): qty 1

## 2022-05-19 MED ORDER — POTASSIUM CHLORIDE 20 MEQ PO PACK
20.0000 meq | PACK | Freq: Every day | ORAL | Status: DC
  Administered 2022-05-20 – 2022-05-21 (×2): 20 meq via ORAL
  Filled 2022-05-19 (×2): qty 1

## 2022-05-19 MED ORDER — TRAZODONE HCL 50 MG PO TABS: 50.0000 mg | ORAL_TABLET | Freq: Every evening | ORAL | Status: DC | PRN

## 2022-05-19 MED ORDER — ALBUTEROL SULFATE (2.5 MG/3ML) 0.083% IN NEBU: 2.5000 mL | INHALATION_SOLUTION | Freq: Four times a day (QID) | RESPIRATORY_TRACT | Status: DC | PRN

## 2022-05-19 MED ORDER — DILTIAZEM HCL ER COATED BEADS 180 MG PO CP24
360.0000 mg | ORAL_CAPSULE | Freq: Every day | ORAL | Status: DC
  Administered 2022-05-20 – 2022-05-21 (×2): 360 mg via ORAL
  Filled 2022-05-19 (×2): qty 2

## 2022-05-19 MED ORDER — ONDANSETRON HCL 4 MG PO TABS: 4.0000 mg | ORAL_TABLET | Freq: Four times a day (QID) | ORAL | Status: DC | PRN

## 2022-05-19 MED ORDER — ACETAMINOPHEN 650 MG RE SUPP: 650.0000 mg | Freq: Four times a day (QID) | RECTAL | Status: DC | PRN

## 2022-05-19 MED ORDER — ACETAMINOPHEN 325 MG PO TABS
650.0000 mg | ORAL_TABLET | Freq: Four times a day (QID) | ORAL | Status: DC | PRN
  Administered 2022-05-20 – 2022-05-21 (×2): 650 mg via ORAL
  Filled 2022-05-19 (×2): qty 2

## 2022-05-19 MED ORDER — PREDNISONE 5 MG PO TABS
5.0000 mg | ORAL_TABLET | Freq: Every day | ORAL | Status: DC
  Administered 2022-05-20 – 2022-05-21 (×2): 5 mg via ORAL
  Filled 2022-05-19 (×2): qty 1

## 2022-05-19 MED ORDER — SODIUM CHLORIDE 0.9 % IV SOLN
75.0000 mg/m2 | Freq: Once | INTRAVENOUS | Status: AC
  Administered 2022-05-19: 120 mg via INTRAVENOUS
  Filled 2022-05-19: qty 12

## 2022-05-19 MED ORDER — ONDANSETRON HCL 4 MG/2ML IJ SOLN: 4.0000 mg | Freq: Four times a day (QID) | INTRAMUSCULAR | Status: DC | PRN

## 2022-05-19 MED ORDER — SODIUM CHLORIDE 0.9% FLUSH
10.0000 mL | Freq: Two times a day (BID) | INTRAVENOUS | Status: DC
  Administered 2022-05-19 – 2022-05-20 (×2): 10 mL

## 2022-05-19 MED ORDER — FUROSEMIDE 10 MG/ML IJ SOLN
40.0000 mg | Freq: Two times a day (BID) | INTRAMUSCULAR | Status: DC
  Administered 2022-05-20 – 2022-05-21 (×3): 40 mg via INTRAVENOUS
  Filled 2022-05-19 (×3): qty 4

## 2022-05-19 MED ORDER — ARFORMOTEROL TARTRATE 15 MCG/2ML IN NEBU
15.0000 ug | INHALATION_SOLUTION | Freq: Two times a day (BID) | RESPIRATORY_TRACT | Status: DC
  Administered 2022-05-20 – 2022-05-21 (×3): 15 ug via RESPIRATORY_TRACT
  Filled 2022-05-19 (×3): qty 2

## 2022-05-19 MED ORDER — UMECLIDINIUM BROMIDE 62.5 MCG/ACT IN AEPB
1.0000 | INHALATION_SPRAY | Freq: Every day | RESPIRATORY_TRACT | Status: DC
  Administered 2022-05-20 – 2022-05-21 (×2): 1 via RESPIRATORY_TRACT
  Filled 2022-05-19: qty 7

## 2022-05-19 MED ORDER — CHLORHEXIDINE GLUCONATE CLOTH 2 % EX PADS
6.0000 | MEDICATED_PAD | Freq: Every day | CUTANEOUS | Status: DC
  Administered 2022-05-20 – 2022-05-21 (×2): 6 via TOPICAL

## 2022-05-19 MED ORDER — SODIUM CHLORIDE 0.9 % IV SOLN: Freq: Once | INTRAVENOUS | Status: AC

## 2022-05-19 MED ORDER — SODIUM CHLORIDE 0.9 % IV SOLN
10.0000 mg | Freq: Once | INTRAVENOUS | Status: AC
  Administered 2022-05-19: 10 mg via INTRAVENOUS
  Filled 2022-05-19 (×2): qty 1

## 2022-05-19 MED FILL — Azacitidine For Inj 100 MG: INTRAMUSCULAR | Qty: 12 | Status: AC

## 2022-05-19 MED FILL — Dexamethasone Sodium Phosphate Inj 100 MG/10ML: INTRAMUSCULAR | Qty: 1 | Status: AC

## 2022-05-19 NOTE — Assessment & Plan Note (Signed)
Continue chronic prednisone 5 mg daily.

## 2022-05-19 NOTE — H&P (Signed)
History and Physical    [name]:[id number] DOB: 1946/03/15 DOA: 05/19/2022  PCP: [name], MD  Patient coming from: Home  I have personally briefly reviewed patient's old medical records in [place]  Chief Complaint: Shortness of breath  HPI: CHANDELL [name] is a 76 y.o. 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  Patient recently admitted 03/29/2022-04/13/2022 for atrial fibrillation with RVR started on IV Cardizem and heparin drip.  She was found to have bilateral pleural effusions with possible right-sided multifocal pneumonia and numerous right side masses.  She was treated on empiric antibiotics and IV steroids.  She underwent right-sided thoracentesis on 03/30/2022 with removal of 500 cc transudate fluid, cytology showed mesothelial cells.  Heparin discontinued on 4/15 due to dorsal foot hematoma and thrombocytopenia.    She remained in atrial fibrillation requiring uptitration of Toprol-XL 175 mg twice daily, Cardizem 360 mg daily, and started on digoxin 0.125 mg daily.  She was also treated for CHF with intermittent Lasix during admission with good urine output with plan to follow-up with cardiology as an outpatient to start Lasix as needed.  Patient ultimately discharged to SNF.  It does not appear that patient required supplemental oxygen on discharge.  Patient states she developed a body rash shortly after leaving the hospital.  She was instructed to hold digoxin for 1 week as potential cause of the rash.  She says her rash did clear up therefore she has not been on digoxin since that time.  For the last 2 days she has been having progressively  worsening exertional dyspnea.  She has had some increased swelling in both of her legs.  She denies any cough, chest pain, nausea, vomiting, abdominal pain, dysuria.  She was concerned that she was reaccumulating fluid on her lungs therefore went to the ED for further evaluation.  She has not seen any obvious bleeding.  She says she normally ambulates with the use of a cane due to left knee arthritis.  ED Course  Labs/Imaging on admission: I have personally reviewed following labs and imaging studies.  Initial vitals showed BP 125/71, pulse 64, RR 18, temp 98.1 F, SPO2 93% on 4 L O2 via [place].  Per ED triage documentation, SPO2 was 85% on room air on arrival.  Labs showed sodium 136, potassium 3.9, bicarb 24, BUN 34, creatinine 0.96, serum glucose 162, WBC 8.5, hemoglobin 7.4, platelets 82,000, BNP 762.2, troponin I 19x2.  2 view chest x-ray showed bilateral pleural effusions, greater on the left and increased in the interim.  Bibasilar atelectasis also noted.  Patient was given IV Lasix 40 mg and the hospitalist service was consulted to admit for further evaluation and management.  Review of Systems: All systems reviewed and are negative except as documented in history of present illness above.   Past Medical History:  Diagnosis Date   Arthritis    Rheumatoid arthritis   Celiac disease    Chronic kidney disease    stage 3 from MD notes   COPD (chronic obstructive pulmonary disease) ([place])    Dyspnea    with going up stairs   Family history of adverse reaction to anesthesia    father had hard time waking up   Headache  sinus headaches   Hot flashes    Hypertension    Iron deficiency anemia    Pneumonia    per patient "I have walking pneumonia"    Past Surgical History:  Procedure Laterality Date   COLONOSCOPY     ECTOPIC PREGNANCY SURGERY      x 2   IR IMAGING GUIDED PORT INSERTION  01/31/2022   REVERSE SHOULDER ARTHROPLASTY Right 02/02/2017   Procedure: RIGHT REVERSE SHOULDER  ARTHROPLASTY;  Surgeon: Netta Cedars, MD;  Location: [facility];  Service: Orthopedics;  Laterality: Right;    Social History:  reports that she quit smoking about 18 years ago. Her smoking use included cigarettes. She has a 30.00 pack-year smoking history. She has never used smokeless tobacco. She reports current alcohol use of about 12.0 - 14.0 standard drinks per week. She reports that she does not use drugs.  Allergies  Allergen Reactions   Gluten Meal Diarrhea   Penicillins Rash and Other (See Comments)      Did it involve swelling of the face/tongue/throat, SOB, or low BP? No Did it involve sudden or severe rash/hives, skin peeling, or any reaction on the inside of your mouth or nose? Yes Did you need to seek medical attention at a hospital or doctor's office? No When did it last happen?    "Many Years Ago"  If all above answers are "NO", may proceed with cephalosporin use.      Alendronate Rash   Hydroxychloroquine Rash    Family History  Problem Relation Age of Onset   Peripheral Artery Disease Father    Diabetes Father      Prior to Admission medications   Medication Sig Start Date End Date Taking? Authorizing Provider  acetaminophen (TYLENOL) 500 MG tablet Take 1,000 mg by mouth in the morning.    [provider]  Budeson-Glycopyrrol-Formoterol (BREZTRI AEROSPHERE) 160-9-4.8 MCG/ACT AERO Inhale 2 puffs into the lungs in the morning and at bedtime. Patient not taking: Reported on 04/28/2022 03/10/22   Garner Nash, DO  Calcium-Magnesium-Vitamin D (CALCIUM 1200+D3 PO) Take 1 tablet by mouth daily.    [provider]  denosumab (PROLIA) 60 MG/ML SOSY injection Inject 60 mg into the skin every 6 (six) months.    [provider]  digoxin (LANOXIN) 0.125 MG tablet Take 1 tablet (0.125 mg total) by mouth daily. 04/13/22   Charlynne Cousins, MD  diltiazem (CARDIZEM CD) 360 MG 24 hr capsule Take 1 capsule (360 mg total) by mouth daily. 04/13/22   Charlynne Cousins, MD  gabapentin (NEURONTIN) 300 MG capsule Take 300 mg by mouth in the morning. 07/21/14   [provider]  InFLIXimab (REMICADE IV) Inject 10 mg into the vein every 2 (two) months.    [provider]  lidocaine-prilocaine (EMLA) cream Apply to affected area once 01/15/22   Truitt Merle, MD  metoprolol succinate (TOPROL-XL) 50 MG 24 hr tablet Take 3.5 tablets (175 mg total) by mouth 2 (two) times daily. 04/13/22   Charlynne Cousins, MD  ondansetron (ZOFRAN) 8 MG tablet Take 1 tablet (8 mg total) by mouth every 8 (eight) hours as needed for nausea or vomiting. 05/17/22   Truitt Merle, MD  predniSONE (DELTASONE) 5 MG tablet Take 5 mg by mouth daily with breakfast. 12/12/21   Gavin Pound, MD  Tiotropium Bromide-Olodaterol (STIOLTO RESPIMAT) 2.5-2.5 MCG/ACT AERS Inhale 2 puffs into the lungs daily. Patient not taking: Reported on 04/28/2022 05/03/21   Martyn Ehrich, NP  Physical Exam: Vitals:   05/19/22 1430 05/19/22 1500 05/19/22 1843 05/19/22 1847  BP: (!) 118/45 (!) 125/58 128/67   Pulse: (!) 54 (!) 45 77 67  Resp: 17 (!) 28 17   Temp:   97.6 F (36.4 C)   TempSrc:   Oral   SpO2: 97% 94% 98% 96%  Weight:    54.6 kg  Height:    5' 3"  (1.6 m)   Constitutional: Resting in bed with head elevated, NAD, calm, comfortable Eyes: EOMI, lids and conjunctivae normal ENMT: Mucous membranes are moist. Posterior pharynx clear of any exudate or lesions.Normal dentition.  Neck: normal, supple, no masses. Respiratory: Bibasilar inspiratory crackles. Normal respiratory effort. No accessory muscle use.  Cardiovascular: Irregularly irregular, no murmurs / rubs / gallops.  +1 bilateral lower extremity edema. 2+ pedal pulses. Abdomen: no tenderness, no masses palpated. No hepatosplenomegaly.  Musculoskeletal: no clubbing / cyanosis. No joint deformity upper and lower extremities. Good ROM, no contractures. Normal muscle tone.  Skin: no rashes, lesions, ulcers. No  induration Neurologic: Sensation intact. Strength 5/5 in all 4.  Psychiatric: Normal judgment and insight. Alert and oriented x 3. Normal mood.   EKG: Personally reviewed. Atrial fibrillation with slow ventricular response, rate 43.  Previous EKG 04/04/2022 showed A-fib with RVR, rate 127.  Assessment/Plan Principal Problem:   Acute on chronic heart failure with preserved ejection fraction (HFpEF) (HCC) Active Problems:   Acute respiratory failure with hypoxia (HCC)   Atrial fibrillation with slow ventricular response (HCC)   Stage 2 moderate COPD by GOLD classification (HCC)   CMML (chronic myelomonocytic leukemia) (HCC)   Thrombocytopenia (HCC)   Anemia of chronic disease   Bilateral pleural effusion   Rheumatoid arthritis (HCC)   [name] is a 76 y.o. 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  Assessment and Plan: * Acute on chronic heart failure with preserved ejection fraction (HFpEF) (HCC) Bilateral pleural effusions Presenting with progressive exertional dyspnea with increased lower extremity edema.  BNP 762.2.  CXR shows interval increase in bilateral pleural effusions compared to post paracentesis x-ray 4/6.  Has not had Lasix as an outpatient.  TTE 03/30/2022 showed EF 45-50%. -Continue on IV Lasix 40 mg twice daily -Reduce Toprol-XL from 175 BID to 150 BID given bradycardic episode -Strict I/O's and daily weights  Acute respiratory failure with hypoxia (HCC) SPO2 85% on room air prior to arrival.  Initially required 4 L via [place] weaned down to 2 L in the ED, now on room air while at rest. -Continue diuresis as above -Supplemental oxygen as needed -Incentive spirometer  Atrial  fibrillation with slow ventricular response ([place]) Remains in atrial fibrillation.  EKG showed bradycardia with heart rate low 40s, now maintaining in the 60s on arrival to the floor. -Reduce Toprol-XL from 175 mg BID to 150 mg BID -Continue Cardizem 360 mg daily -Has been off of digoxin 0.125 mg daily recently due to suspected drug rash which has resolved -Keep on telemetry  Stage 2 moderate COPD by GOLD classification ([place]) Stable without wheezing on admission.  Continue Stiolto and albuterol as needed.  CMML (chronic myelomonocytic leukemia) [facility]) Following with oncology, Dr. Burr Medico.  On active therapy with azacitidine, last infusion 5/25.  Rheumatoid arthritis (HCC) Continue chronic prednisone 5  mg daily.  Anemia of chronic disease In setting of CMML.  Hemoglobin 7.4 on admission.  No longer on anticoagulation and denies any active bleeding.  She is near blood transfusion threshold (<7.5-8.0) but will hold off for now until volume status is more optimized.  Thrombocytopenia ([place]) Platelets 82,000 on admission, improved from prior.  Continue to monitor.  DVT prophylaxis: SCDs Start: 05/19/22 1959 Code Status: Full code, confirmed with patient on admission. Family Communication: Discussed with patient, she has discussed with family. Disposition Plan: From home and likely discharge to home pending clinical progress Consults called: None Severity of Illness: The appropriate patient status for this patient is INPATIENT. Inpatient status is judged to be reasonable and necessary in order to provide the required intensity of service to ensure the patient's safety. The patient's presenting symptoms, physical exam findings, and initial radiographic and laboratory data in the context of their chronic comorbidities is felt to place them at high risk for further clinical deterioration. Furthermore, it is not anticipated that the patient will be medically stable for discharge from the hospital within  2 midnights of admission.   * I certify that at the point of admission it is my clinical judgment that the patient will require inpatient hospital care spanning beyond 2 midnights from the point of admission due to high intensity of service, high risk for further deterioration and high frequency of surveillance required.Zada Finders MD [facility]  If 7PM-7AM, please contact night-coverage [email or web address]  05/19/2022, 8:22 PM

## 2022-05-19 NOTE — ED Notes (Signed)
Pt came to ED with Post access. Pt stated that she had chemo yesterday and they left the [facility] accessed. Pt stated that they had heparinized before leaving and that her port was ok to use.

## 2022-05-19 NOTE — ED Provider Notes (Signed)
[facility] EMERGENCY DEPT Provider Note   CSN: [id number] Arrival date & time: 05/19/22  1258     History  Chief Complaint  Patient presents with   Shortness of Breath    '     [name] is a 76 y.o. female.  Presents to ER due to concern for shortness of breath.  Patient states that over the past couple weeks she has been having steadily worsening shortness of breath.  Seems to be worse in the mornings, worse with exertion.  Right now actually does not have major breathing issues.  She says that she has leg swelling that seems to come and go but today does not seem to be going away even with elevation.  She is not on any diuretic.  Reviewed last discharge summary from Dr. Olevia Bowens.  Extensive past medical history including history of CML, pancytopenia, rheumatoid arthritis, A-fib, heart failure.  EF last echocardiogram per my review was 45%.  HPI     Home Medications Prior to Admission medications   Medication Sig Start Date End Date Taking? Authorizing Provider  acetaminophen (TYLENOL) 500 MG tablet Take 1,000 mg by mouth in the morning.    [provider]  Budeson-Glycopyrrol-Formoterol (BREZTRI AEROSPHERE) 160-9-4.8 MCG/ACT AERO Inhale 2 puffs into the lungs in the morning and at bedtime. Patient not taking: Reported on 04/28/2022 03/10/22   Garner Nash, DO  Calcium-Magnesium-Vitamin D (CALCIUM 1200+D3 PO) Take 1 tablet by mouth daily.    [provider]  denosumab (PROLIA) 60 MG/ML SOSY injection Inject 60 mg into the skin every 6 (six) months.    [provider]  digoxin (LANOXIN) 0.125 MG tablet Take 1 tablet (0.125 mg total) by mouth daily. 04/13/22   Charlynne Cousins, MD  diltiazem (CARDIZEM CD) 360 MG 24 hr capsule Take 1 capsule (360 mg total) by mouth daily. 04/13/22   Charlynne Cousins, MD  gabapentin (NEURONTIN) 300 MG capsule Take 300 mg by mouth in the morning. 07/21/14   [provider]  InFLIXimab  (REMICADE IV) Inject 10 mg into the vein every 2 (two) months.    [provider]  lidocaine-prilocaine (EMLA) cream Apply to affected area once 01/15/22   Truitt Merle, MD  metoprolol succinate (TOPROL-XL) 50 MG 24 hr tablet Take 3.5 tablets (175 mg total) by mouth 2 (two) times daily. 04/13/22   Charlynne Cousins, MD  ondansetron (ZOFRAN) 8 MG tablet Take 1 tablet (8 mg total) by mouth every 8 (eight) hours as needed for nausea or vomiting. 05/17/22   Truitt Merle, MD  predniSONE (DELTASONE) 5 MG tablet Take 5 mg by mouth daily with breakfast. 12/12/21   Gavin Pound, MD  Tiotropium Bromide-Olodaterol (STIOLTO RESPIMAT) 2.5-2.5 MCG/ACT AERS Inhale 2 puffs into the lungs daily. Patient not taking: Reported on 04/28/2022 05/03/21   Martyn Ehrich, NP      Allergies    Gluten meal, Penicillins, Alendronate, and Hydroxychloroquine    Review of Systems   Review of Systems  Constitutional:  Positive for fatigue. Negative for chills and fever.  HENT:  Negative for ear pain and sore throat.   Eyes:  Negative for pain and visual disturbance.  Respiratory:  Positive for shortness of breath. Negative for cough.   Cardiovascular:  Negative for chest pain and palpitations.  Gastrointestinal:  Negative for abdominal pain and vomiting.  Genitourinary:  Negative for dysuria and hematuria.  Musculoskeletal:  Negative for arthralgias and back pain.  Skin:  Negative for color  change and rash.  Neurological:  Negative for seizures and syncope.  All other systems reviewed and are negative.  Physical Exam Updated Vital Signs BP (!) 125/58   Pulse (!) 45   Temp 98.1 F (36.7 C) (Oral)   Resp (!) 28   Ht 5' 4"  (1.626 m)   Wt 54.4 kg   SpO2 94%   BMI 20.60 kg/m  Physical Exam Vitals and nursing note reviewed.  Constitutional:      General: She is not in acute distress.    Appearance: She is well-developed.  HENT:     Head: Normocephalic and atraumatic.  Eyes:     Conjunctiva/sclera:  Conjunctivae normal.  Cardiovascular:     Rate and Rhythm: Normal rate and regular rhythm.     Heart sounds: No murmur heard. Pulmonary:     Effort: Pulmonary effort is normal. No respiratory distress.     Comments: Lung sounds somewhat diminished at bases, slight crackles noted bilaterally, speaking full sentences, not in distress Abdominal:     Palpations: Abdomen is soft.     Tenderness: There is no abdominal tenderness.  Musculoskeletal:        General: No swelling.     Cervical back: Neck supple.     Comments: Bilateral pitting edema to level of knee  Skin:    General: Skin is warm and dry.     Capillary Refill: Capillary refill takes less than 2 seconds.  Neurological:     Mental Status: She is alert.  Psychiatric:        Mood and Affect: Mood normal.    ED Results / Procedures / Treatments   Labs (all labs ordered are listed, but only abnormal results are displayed) Labs Reviewed  CBC WITH DIFFERENTIAL/PLATELET - Abnormal; Notable for the following components:      Result Value   RBC 2.18 (*)    Hemoglobin 7.4 (*)    HCT 22.7 (*)    MCV 104.1 (*)    RDW 21.5 (*)    Platelets 82 (*)    nRBC 0.6 (*)    Abs Immature Granulocytes 0.10 (*)    All other components within normal limits  COMPREHENSIVE METABOLIC PANEL - Abnormal; Notable for the following components:   Glucose, Bld 162 (*)    BUN 34 (*)    Total Protein 5.9 (*)    AST 14 (*)    All other components within normal limits  BRAIN NATRIURETIC PEPTIDE - Abnormal; Notable for the following components:   B Natriuretic Peptide 762.2 (*)    All other components within normal limits  TROPONIN I (HIGH SENSITIVITY) - Abnormal; Notable for the following components:   Troponin I (High Sensitivity) 19 (*)    All other components within normal limits  TROPONIN I (HIGH SENSITIVITY)    EKG None  Radiology DG Chest 2 View  Result Date: 05/19/2022 CLINICAL DATA:  sob EXAM: CHEST - 2 VIEW COMPARISON:  March 30, 2022  FINDINGS: Again seen is the right transjugular chest port catheter with its tip at the caval atrial junction. Mild cardiomegaly. Bilateral pleural effusion greater on the left side. Bibasilar atelectasis greater on the left. Mild pulmonary vascular congestion. The visualized skeletal structures are unremarkable. Stable, right shoulder prosthesis. IMPRESSION: Bilateral pleural effusion greater on the left and has increased in the interim. Bibasilar atelectasis greater on the left and is more conspicuous in the present study. Electronically Signed   By: Frazier Richards M.D.   On: 05/19/2022 13:43  Procedures .Critical Care Performed by: Lucrezia Starch, MD Authorized by: Lucrezia Starch, MD   Critical care provider statement:    Critical care time (minutes):  37   Critical care was necessary to treat or prevent imminent or life-threatening deterioration of the following conditions:  Respiratory failure   Critical care was time spent personally by me on the following activities:  Development of treatment plan with patient or surrogate, discussions with consultants, evaluation of patient's response to treatment, examination of patient, ordering and review of laboratory studies, ordering and review of radiographic studies, ordering and performing treatments and interventions, pulse oximetry, re-evaluation of patient's condition and review of old charts    Medications Ordered in ED Medications  acetaminophen (TYLENOL) tablet 650 mg (650 mg Oral Given 05/19/22 1425)  furosemide (LASIX) injection 40 mg (40 mg Intravenous Given 05/19/22 1532)    ED Course/ Medical Decision Making/ A&P                           Medical Decision Making Amount and/or Complexity of Data Reviewed Labs: ordered. Radiology: ordered.  Risk OTC drugs. Prescription drug management. Decision regarding hospitalization.   76 year old lady with extensive past medical history including history of CML, pancytopenia,  rheumatoid arthritis, A-fib, heart failure presenting to ER for shortness of breath.  On exam patient was well-appearing but was mildly hypoxic.  Also noted bilateral pitting edema in her legs.  CXR was obtained, I independently reviewed and interpreted results, bilateral pleural effusions with some pulmonary vascular congestion.  Her BNP is elevated.  Her EKG demonstrates A-fib.  Slight elevation in troponin.  Anemia, appears to be chronic when compared to prior values.  Based on all of these findings I suspect her symptoms most likely are related to fluid overload state, heart failure.  Not on diuretic therapy currently.  We will start her on IV Lasix, admit to medicine for further management.  Placed on supplemental nasal cannula for now.  Not in distress, doing well.  Patient and husband agreeable to admission.  Discussed with Dr. Eliberto Ivory who will accept patient.        Final Clinical Impression(s) / ED Diagnoses Final diagnoses:  Acute respiratory failure with hypoxia (HCC)  Pleural effusion    Rx / DC Orders ED Discharge Orders     None         Lucrezia Starch, MD 05/19/22 1547

## 2022-05-19 NOTE — ED Triage Notes (Addendum)
Pt reports ShOB upon waking for the last 2 days. Pt is concerned she has fluid on her lungs. Pt was in the ICU 3 weeks for pulmonary edema. Pt denies ShOB at this time. SpO2 on R/A 85%. Pt able to speak in complete sentences between breath.

## 2022-05-19 NOTE — Assessment & Plan Note (Signed)
In setting of CMML.  Hemoglobin 7.4 on admission.  No longer on anticoagulation and denies any active bleeding.  She is near blood transfusion threshold (<7.5-8.0) but will hold off for now until volume status is more optimized.

## 2022-05-19 NOTE — Assessment & Plan Note (Addendum)
Bilateral pleural effusions Presenting with progressive exertional dyspnea with increased lower extremity edema.  BNP 762.2.  CXR shows interval increase in bilateral pleural effusions compared to post paracentesis x-ray 4/6.  Has not had Lasix as an outpatient.  TTE 03/30/2022 showed EF 45-50%. -Continue on IV Lasix 40 mg twice daily -Reduce Toprol-XL from 175 BID to 150 BID given bradycardic episode -Strict I/O's and daily weights

## 2022-05-19 NOTE — ED Notes (Signed)
2 attempts to call report no answer

## 2022-05-19 NOTE — Assessment & Plan Note (Addendum)
Following with oncology, Dr. Burr Medico.  On active therapy with azacitidine, last infusion 5/25.

## 2022-05-19 NOTE — ED Notes (Signed)
Patient transported to X-ray 

## 2022-05-19 NOTE — Assessment & Plan Note (Signed)
Stable without wheezing on admission.  Continue Stiolto and albuterol as needed.

## 2022-05-19 NOTE — Progress Notes (Signed)
Plan of Care Note for accepted transfer   Patient: [name] MRN: [id number]   DOA: 05/19/2022  Facility requesting transfer: [facility] Requesting Provider: Dr. Roslynn Amble Reason for transfer: hypoxia/pleural effusion Facility course: 76 y.o. female past medical history significant for CML on chemotherapy, pancytopenia, rheumatoid arthritis on Remicade, newly diagnosed CHF with EF of 45-50%, PAF not on AC, paroxysmal atrial fibrillation, pulmonary nodules who presented to ED with complaints of SOB. Admitted in April for afib with RVR and pleural effusion s/p right thoracentesis on 03/30/22.  She also receives infusions for her CML, due again tomorrow.   Vitals: requiring 4L oxygen [place] to maintain oxygenation, now down to 2L. Presented with oxygen of 85%, no distress.   CXR: bilateral pleural effusion greater on left and increased in interim. Bibasilar atelectasis > on left and more conspicuous in present study. Mild pulm. Vascular congestion.  Labs: hgb of 7.4, platelets 82, bnp: 762.   Given 73m IV lasix in ED. May need repeat thoracentesis vs. Diuresis.   Plan of care: The patient [facility]    Author: AOrma Flaming MD 05/19/2022  Check [email or web address] for on-call coverage.  Nursing staff, Please call [email or web address] on Amion as soon as patient's arrival, so appropriate admitting provider can evaluate the pt.

## 2022-05-19 NOTE — Assessment & Plan Note (Signed)
Platelets 82,000 on admission, improved from prior.  Continue to monitor.

## 2022-05-19 NOTE — ED Notes (Signed)
Attempted to report no answer

## 2022-05-19 NOTE — Hospital Course (Signed)
[name] is a 76 y.o. 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

## 2022-05-19 NOTE — Assessment & Plan Note (Signed)
Remains in atrial fibrillation.  EKG showed bradycardia with heart rate low 40s, now maintaining in the 60s on arrival to the floor. -Reduce Toprol-XL from 175 mg BID to 150 mg BID -Continue Cardizem 360 mg daily -Has been off of digoxin 0.125 mg daily recently due to suspected drug rash which has resolved -Keep on telemetry

## 2022-05-19 NOTE — Assessment & Plan Note (Signed)
SPO2 85% on room air prior to arrival.  Initially required 4 L via [place] weaned down to 2 L in the ED, now on room air while at rest. -Continue diuresis as above -Supplemental oxygen as needed -Incentive spirometer

## 2022-05-20 ENCOUNTER — Inpatient Hospital Stay: Payer: Medicare Other

## 2022-05-20 DIAGNOSIS — I4891 Unspecified atrial fibrillation: Secondary | ICD-10-CM | POA: Diagnosis not present

## 2022-05-20 DIAGNOSIS — I5033 Acute on chronic diastolic (congestive) heart failure: Secondary | ICD-10-CM | POA: Diagnosis not present

## 2022-05-20 DIAGNOSIS — D638 Anemia in other chronic diseases classified elsewhere: Secondary | ICD-10-CM | POA: Diagnosis not present

## 2022-05-20 DIAGNOSIS — J9601 Acute respiratory failure with hypoxia: Secondary | ICD-10-CM

## 2022-05-20 LAB — CBC
HCT: 21.3 % — ABNORMAL LOW (ref 36.0–46.0)
Hemoglobin: 7.1 g/dL — ABNORMAL LOW (ref 12.0–15.0)
MCH: 34.6 pg — ABNORMAL HIGH (ref 26.0–34.0)
MCHC: 33.3 g/dL (ref 30.0–36.0)
MCV: 103.9 fL — ABNORMAL HIGH (ref 80.0–100.0)
Platelets: 80 10*3/uL — ABNORMAL LOW (ref 150–400)
RBC: 2.05 MIL/uL — ABNORMAL LOW (ref 3.87–5.11)
RDW: 21.2 % — ABNORMAL HIGH (ref 11.5–15.5)
WBC: 5.6 10*3/uL (ref 4.0–10.5)
nRBC: 0.5 % — ABNORMAL HIGH (ref 0.0–0.2)

## 2022-05-20 LAB — BASIC METABOLIC PANEL
Anion gap: 8 (ref 5–15)
BUN: 38 mg/dL — ABNORMAL HIGH (ref 8–23)
CO2: 30 mmol/L (ref 22–32)
Calcium: 8.9 mg/dL (ref 8.9–10.3)
Chloride: 98 mmol/L (ref 98–111)
Creatinine, Ser: 0.91 mg/dL (ref 0.44–1.00)
GFR, Estimated: >60 mL/min (ref >60)
Glucose, Bld: 156 mg/dL — ABNORMAL HIGH (ref 70–99)
Potassium: 3.2 mmol/L — ABNORMAL LOW (ref 3.5–5.1)
Sodium: 136 mmol/L (ref 135–145)

## 2022-05-20 LAB — MAGNESIUM: Magnesium: 2.1 mg/dL (ref 1.7–2.4)

## 2022-05-20 LAB — PREPARE RBC (CROSSMATCH)

## 2022-05-20 MED ORDER — SODIUM CHLORIDE 0.9% IV SOLUTION: Freq: Once | INTRAVENOUS | Status: AC

## 2022-05-20 MED ORDER — BUDESONIDE 0.25 MG/2ML IN SUSP
0.2500 mg | Freq: Two times a day (BID) | RESPIRATORY_TRACT | Status: DC
  Administered 2022-05-20 – 2022-05-21 (×2): 0.25 mg via RESPIRATORY_TRACT
  Filled 2022-05-20 (×2): qty 2

## 2022-05-20 MED ORDER — GABAPENTIN 300 MG PO CAPS
300.0000 mg | ORAL_CAPSULE | Freq: Every day | ORAL | Status: DC
Start: 2022-05-20 — End: 2022-05-21
  Administered 2022-05-20 – 2022-05-21 (×2): 300 mg via ORAL
  Filled 2022-05-20 (×2): qty 1

## 2022-05-20 MED ORDER — POTASSIUM CHLORIDE CRYS ER 20 MEQ PO TBCR
40.0000 meq | EXTENDED_RELEASE_TABLET | ORAL | Status: AC
  Administered 2022-05-20 (×2): 40 meq via ORAL
  Filled 2022-05-20 (×2): qty 2

## 2022-05-20 NOTE — Progress Notes (Signed)
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  Found to be in decompensated CHF and admitted for IV diuretics.  Also noted to have worsening anemia and was transfused 1 unit PRBC.   Assessment & Plan:   Principal Problem:   Acute on chronic heart failure with preserved ejection fraction (HFpEF) (HCC) Active Problems:   Acute respiratory failure with hypoxia (HCC)   Atrial fibrillation with slow ventricular response (HCC)   Stage 2 moderate COPD by GOLD classification (HCC)   CMML (chronic myelomonocytic leukemia) (HCC)   Thrombocytopenia (HCC)   Anemia of chronic disease   Bilateral pleural effusion   Rheumatoid arthritis (HCC)   Acute on chronic systolic congestive heart failure -Echo from 03/2022 shows EF of 45 to 50% -BNP 762 on admission -Started on IV Lasix with good urine output -Overall lower extremity edema is better -Renal function is currently stable -We will continue IV Lasix for another 24 hours -Continue Toprol  Acute respiratory failure with hypoxia -Oxygen saturations of 85% on room air with associated tachypnea -Overall respiratory status appears to have improved with diuresis -She has been weaned off of oxygen and is breathing comfortably on room air  COPD -No wheezing at this time -Continue inhaled steroids, LABA  Atrial fibrillation -Continue on Toprol and Cardizem -Was previously on anticoagulation, but this was discontinued due to thrombocytopenia and previous issues with bleeding/hematoma -Can readdress anticoagulation as an outpatient when she follows up with cardiology -Digoxin discontinued recently due to concerns for rash  CMML -Follows with oncology -Last infusion  5/25  Rheumatoid arthritis -On chronic prednisone therapy -She also takes infliximab  Anemia of chronic disease -Hemoglobin down to 7.1 -With her fatigue and shortness of breath, would likely benefit from transfusion of PRBC -Patient/husband are in agreement  Thrombocytopenia -Platelet count 80,000, near baseline -Continue to monitor  Hypokalemia -Likely related to diuretics -Magnesium 2.1 -Replace potassium   DVT prophylaxis: SCDs Start: 05/19/22 1959  Code Status: Full code Family Communication: Updated patient's husband over the phone Disposition Plan: Status is: Inpatient Remains inpatient appropriate because: Transfusing PRBC for symptomatic anemia, continue IV diuresis     Consultants:    Procedures:    Antimicrobials:      Subjective: She says she is feeling better.  Overall shortness of breath and lower extremity edema improving  Objective: Vitals:   05/20/22 0504 05/20/22 0829 05/20/22 0925 05/20/22 1342  BP: 139/61  139/67 (!) 132/59  Pulse:   75 (!) 54  Resp: 16  14 14   Temp: 98.2 F (36.8 C)  98.2 F (36.8 C) 98.5 F (36.9 C)  TempSrc: Oral  Oral Oral  SpO2: 92% 95% 94% 92%  Weight:      Height:        Intake/Output Summary (Last 24 hours) at 05/20/2022 1533 Last data filed at 05/20/2022 1350 Gross per 24 hour  Intake 360 ml  Output 3350 ml  Net -2990 ml   Filed Weights   05/19/22 1313 05/19/22 1847 05/20/22 0500  Weight: 54.4 kg 54.6 kg 59.8 kg    Examination:  General exam: Appears calm and comfortable  Respiratory system: Clear to auscultation. Respiratory effort normal. Cardiovascular system: S1 & S2 heard, RRR. No JVD, murmurs, rubs, gallops or clicks. No  pedal edema. Gastrointestinal system: Abdomen is nondistended, soft and nontender. No organomegaly or masses felt. Normal bowel sounds heard. Central nervous system: Alert and oriented. No focal neurological deficits. Extremities: Symmetric 5 x 5 power. Skin: No rashes,  lesions or ulcers Psychiatry: Judgement and insight appear normal. Mood & affect appropriate.     Data Reviewed: I have personally reviewed following labs and imaging studies  CBC: Recent Labs  Lab 05/15/22 1302 05/19/22 1320 05/20/22 0350  WBC 11.1* 8.5 5.6  NEUTROABS 3.3 7.0  --   HGB 8.0* 7.4* 7.1*  HCT 24.7* 22.7* 21.3*  MCV 105.6* 104.1* 103.9*  PLT 69* 82* 80*   Basic Metabolic Panel: Recent Labs  Lab 05/15/22 1354 05/19/22 1320 05/20/22 0350  NA 139 136 136  K 3.9 3.9 3.2*  CL 103 100 98  CO2 30 24 30   GLUCOSE 116* 162* 156*  BUN 25* 34* 38*  CREATININE 1.18* 0.96 0.91  CALCIUM 9.0 9.1 8.9  MG  --   --  2.1   GFR: Estimated Creatinine Clearance: 44.2 mL/min (by C-G formula based on SCr of 0.91 mg/dL). Liver Function Tests: Recent Labs  Lab 05/15/22 1354 05/19/22 1320  AST 16 14*  ALT 19 20  ALKPHOS 42 38  BILITOT 0.9 1.2  PROT 5.6* 5.9*  ALBUMIN 3.5 3.9   No results for input(s): LIPASE, AMYLASE in the last 168 hours. No results for input(s): AMMONIA in the last 168 hours. Coagulation Profile: No results for input(s): INR, PROTIME in the last 168 hours. Cardiac Enzymes: No results for input(s): CKTOTAL, CKMB, CKMBINDEX, TROPONINI in the last 168 hours. BNP (last 3 results) No results for input(s): PROBNP in the last 8760 hours. HbA1C: No results for input(s): HGBA1C in the last 72 hours. CBG: No results for input(s): GLUCAP in the last 168 hours. Lipid Profile: No results for input(s): CHOL, HDL, LDLCALC, TRIG, CHOLHDL, LDLDIRECT in the last 72 hours. Thyroid Function Tests: No results for input(s): TSH, T4TOTAL, FREET4, T3FREE, THYROIDAB in the last 72 hours. Anemia Panel: No results for input(s): VITAMINB12, FOLATE, FERRITIN, TIBC, IRON, RETICCTPCT in the last 72 hours. Sepsis Labs: No results for input(s): PROCALCITON, LATICACIDVEN in the last 168 hours.  No results found for this or any previous visit (from the past 240 hour(s)).        Radiology Studies: DG Chest 2 View  Result Date: 05/19/2022 CLINICAL DATA:  sob EXAM: CHEST - 2 VIEW COMPARISON:  March 30, 2022 FINDINGS: Again seen is the right transjugular chest port catheter with its tip at the caval atrial junction. Mild cardiomegaly. Bilateral pleural effusion greater on the left side. Bibasilar atelectasis greater on the left. Mild pulmonary vascular congestion. The visualized skeletal structures are unremarkable. Stable, right shoulder prosthesis. IMPRESSION: Bilateral pleural effusion greater on the left and has increased in the interim. Bibasilar atelectasis greater on the left and is more conspicuous in the present study. Electronically Signed   By: Frazier Richards M.D.   On: 05/19/2022 13:43        Scheduled Meds:  sodium chloride   Intravenous Once   arformoterol  15 mcg Nebulization BID   And   umeclidinium bromide  1 puff Inhalation Daily   Chlorhexidine Gluconate Cloth  6 each Topical Daily   diltiazem  360 mg Oral Daily   furosemide  40 mg Intravenous BID   metoprolol succinate  150 mg Oral BID   potassium chloride  20 mEq Oral Daily   potassium chloride  40  mEq Oral Q4H   predniSONE  5 mg Oral Q breakfast   sodium chloride flush  10-40 mL Intracatheter Q12H   Continuous Infusions:   LOS: 1 day    Time spent: 57mns    JKathie Dike MD [facility]   If 7PM-7AM, please contact night-coverage [email or web address]  05/20/2022, 3:33 PM

## 2022-05-21 DIAGNOSIS — I4891 Unspecified atrial fibrillation: Secondary | ICD-10-CM | POA: Diagnosis not present

## 2022-05-21 DIAGNOSIS — D638 Anemia in other chronic diseases classified elsewhere: Secondary | ICD-10-CM | POA: Diagnosis not present

## 2022-05-21 DIAGNOSIS — I5033 Acute on chronic diastolic (congestive) heart failure: Secondary | ICD-10-CM | POA: Diagnosis not present

## 2022-05-21 DIAGNOSIS — J9601 Acute respiratory failure with hypoxia: Secondary | ICD-10-CM | POA: Diagnosis not present

## 2022-05-21 LAB — CBC
HCT: 28.6 % — ABNORMAL LOW (ref 36.0–46.0)
Hemoglobin: 9.2 g/dL — ABNORMAL LOW (ref 12.0–15.0)
MCH: 32.7 pg (ref 26.0–34.0)
MCHC: 32.2 g/dL (ref 30.0–36.0)
MCV: 101.8 fL — ABNORMAL HIGH (ref 80.0–100.0)
Platelets: 81 10*3/uL — ABNORMAL LOW (ref 150–400)
RBC: 2.81 MIL/uL — ABNORMAL LOW (ref 3.87–5.11)
RDW: 21.4 % — ABNORMAL HIGH (ref 11.5–15.5)
WBC: 12.4 10*3/uL — ABNORMAL HIGH (ref 4.0–10.5)
nRBC: 0.2 % (ref 0.0–0.2)

## 2022-05-21 LAB — BRAIN NATRIURETIC PEPTIDE: B Natriuretic Peptide: 148.4 pg/mL — ABNORMAL HIGH (ref 0.0–100.0)

## 2022-05-21 LAB — TYPE AND SCREEN
ABO/RH(D): B POS
Antibody Screen: NEGATIVE
Unit division: 0

## 2022-05-21 LAB — BPAM RBC
Blood Product Expiration Date: 202306142359
ISSUE DATE / TIME: 202305271540
Unit Type and Rh: 7300

## 2022-05-21 LAB — BASIC METABOLIC PANEL
Anion gap: 9 (ref 5–15)
BUN: 36 mg/dL — ABNORMAL HIGH (ref 8–23)
CO2: 31 mmol/L (ref 22–32)
Calcium: 8.8 mg/dL — ABNORMAL LOW (ref 8.9–10.3)
Chloride: 95 mmol/L — ABNORMAL LOW (ref 98–111)
Creatinine, Ser: 1 mg/dL (ref 0.44–1.00)
GFR, Estimated: 59 mL/min — ABNORMAL LOW (ref >60)
Glucose, Bld: 110 mg/dL — ABNORMAL HIGH (ref 70–99)
Potassium: 3.7 mmol/L (ref 3.5–5.1)
Sodium: 135 mmol/L (ref 135–145)

## 2022-05-21 MED ORDER — BISACODYL 5 MG PO TBEC
10.0000 mg | DELAYED_RELEASE_TABLET | Freq: Once | ORAL | Status: AC
  Administered 2022-05-21: 10 mg via ORAL
  Filled 2022-05-21: qty 2

## 2022-05-21 MED ORDER — METOPROLOL SUCCINATE ER 50 MG PO TB24: 150.0000 mg | ORAL_TABLET | Freq: Two times a day (BID) | ORAL | 1 refills | Status: DC

## 2022-05-21 MED ORDER — FUROSEMIDE 20 MG PO TABS: 20.0000 mg | ORAL_TABLET | Freq: Every day | ORAL | 11 refills | Status: AC

## 2022-05-21 MED ORDER — HEPARIN SOD (PORK) LOCK FLUSH 100 UNIT/ML IV SOLN
500.0000 [IU] | INTRAVENOUS | Status: DC | PRN
  Filled 2022-05-21: qty 5

## 2022-05-21 NOTE — Discharge Summary (Signed)
Physician Discharge Summary  [name] SEG:[id number] DOB: 09-17-1946 DOA: 05/19/2022  PCP: [name], MD  Admit date: 05/19/2022 Discharge date: 05/21/2022  Admitted From: Home Disposition: Home  Recommendations for Outpatient Follow-up:  Follow up with PCP in 1-2 weeks Please obtain BMP/CBC in one week Follow-up with cardiology, will as cardiology to arrange this Follow-up with oncology as previously scheduled  Home Health: Home health PT, OT, RN Equipment/Devices:  Discharge Condition: Stable CODE STATUS: Full code Diet recommendation: Heart healthy  Brief/Interim Summary: 76 year old female with history of 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  Found to be in decompensated CHF and admitted for IV diuretics.  Also noted to have worsening anemia and was transfused 1 unit PRBC.  Discharge Diagnoses:  Principal Problem:   Acute on chronic heart failure with preserved ejection fraction (HFpEF) (HCC) Active Problems:   Acute respiratory failure with hypoxia (HCC)   Atrial fibrillation with slow ventricular response (HCC)   Stage 2 moderate COPD by GOLD classification (HCC)   CMML (chronic myelomonocytic leukemia) (HCC)   Thrombocytopenia (HCC)   Anemia of chronic disease   Bilateral pleural effusion   Rheumatoid arthritis (HCC)  Acute on chronic systolic congestive heart failure -Echo from 03/2022 shows EF of 45 to 50% -BNP 762 on admission -> improved to 148 with diuresis -Started on IV Lasix with good urine output -Overall lower extremity edema is better -Renal function is currently stable -She has been transitioned to Lasix 20 mg p.o. daily as maintenance therapy -Continue Toprol   Acute respiratory failure with hypoxia -Oxygen saturations of 85% on room air with associated tachypnea -Overall respiratory status appears to have improved with  diuresis -She has been weaned off of oxygen and is breathing comfortably on room air   COPD -No wheezing at this time -Continue inhaled steroids, LABA   Atrial fibrillation -Continue on Toprol and Cardizem -Was previously on anticoagulation, but this was discontinued due to thrombocytopenia and previous issues with bleeding/hematoma -Can readdress anticoagulation as an outpatient when she follows up with cardiology -Digoxin discontinued recently due to concerns for rash   CMML -Follows with oncology -Last infusion 5/25   Rheumatoid arthritis -On chronic prednisone therapy -She also takes infliximab   Anemia of chronic disease -Hemoglobin down to 7.1 -With her fatigue and shortness of breath, would likely benefit from transfusion of PRBC -Posttransfusion CBC noted to be improved to 9.2   Thrombocytopenia -Platelet count 80,000, near baseline -Continue to monitor   Hypokalemia -Likely related to diuretics -Magnesium 2.1 -Replaced  Discharge Instructions  Discharge Instructions     Diet - low sodium heart healthy   Complete by: As directed    Increase activity slowly   Complete by: As directed       Allergies as of 05/21/2022       Reactions   Gluten Meal Diarrhea   Penicillins Rash, Other (See Comments)   Did it involve swelling of the face/tongue/throat, SOB, or low BP? No Did it involve sudden or severe rash/hives, skin peeling, or any reaction on the inside of your mouth or nose? Yes Did you need to seek medical attention at a hospital or doctor's office? No When did it last happen?    "Many Years Ago"  If all above answers are "NO", may proceed with cephalosporin use.   Alendronate Rash   Hydroxychloroquine Rash        Medication List  STOP taking these medications    Breztri Aerosphere 160-9-4.8 MCG/ACT Aero Generic drug: Budeson-Glycopyrrol-Formoterol   digoxin 0.125 MG tablet Commonly known as: LANOXIN       TAKE these medications     acetaminophen 500 MG tablet Commonly known as: TYLENOL Take 1,000 mg by mouth in the morning.   Calcium 600/Vitamin D 600-10 MG-MCG Chew Generic drug: Calcium Carb-Cholecalciferol Chew 2 tablets by mouth daily.   denosumab 60 MG/ML Sosy injection Commonly known as: PROLIA Inject 60 mg into the skin every 6 (six) months.   diltiazem 360 MG 24 hr capsule Commonly known as: CARDIZEM CD Take 1 capsule (360 mg total) by mouth daily.   furosemide 20 MG tablet Commonly known as: Lasix Take 1 tablet (20 mg total) by mouth daily.   gabapentin 300 MG capsule Commonly known as: NEURONTIN Take 300 mg by mouth in the morning.   lidocaine-prilocaine cream Commonly known as: EMLA Apply to affected area once   metoprolol succinate 50 MG 24 hr tablet Commonly known as: TOPROL-XL Take 3 tablets (150 mg total) by mouth 2 (two) times daily. What changed: how much to take   ondansetron 8 MG tablet Commonly known as: ZOFRAN Take 1 tablet (8 mg total) by mouth every 8 (eight) hours as needed for nausea or vomiting.   predniSONE 5 MG tablet Commonly known as: DELTASONE Take 5 mg by mouth daily with breakfast.   REMICADE IV Inject 10 mg into the vein every 2 (two) months.   Stiolto Respimat 2.5-2.5 MCG/ACT Aers Generic drug: Tiotropium Bromide-Olodaterol Inhale 2 puffs into the lungs daily.   vitamin C 500 MG tablet Commonly known as: ASCORBIC ACID Take 500 mg by mouth daily.        Follow-up Information     [facility] [place] Follow up.   Specialty: [place] Why: [facility] is currently open to you and will contact you to continue the in home visits. Contact information: [address] [place] [address] [phone]                Allergies  Allergen Reactions   Gluten Meal Diarrhea   Penicillins Rash and Other (See Comments)      Did it involve swelling of the face/tongue/throat, SOB, or low BP? No Did it involve sudden or  severe rash/hives, skin peeling, or any reaction on the inside of your mouth or nose? Yes Did you need to seek medical attention at a hospital or doctor's office? No When did it last happen?    "Many Years Ago"  If all above answers are "NO", may proceed with cephalosporin use.      Alendronate Rash   Hydroxychloroquine Rash    Consultations:    Procedures/Studies: DG Chest 2 View  Result Date: 05/19/2022 CLINICAL DATA:  sob EXAM: CHEST - 2 VIEW COMPARISON:  March 30, 2022 FINDINGS: Again seen is the right transjugular chest port catheter with its tip at the caval atrial junction. Mild cardiomegaly. Bilateral pleural effusion greater on the left side. Bibasilar atelectasis greater on the left. Mild pulmonary vascular congestion. The visualized skeletal structures are unremarkable. Stable, right shoulder prosthesis. IMPRESSION: Bilateral pleural effusion greater on the left and has increased in the interim. Bibasilar atelectasis greater on the left and is more conspicuous in the present study. Electronically Signed   By: Frazier Richards M.D.   On: 05/19/2022 13:43      Subjective: Feels better.  Shortness of breath improved.  Discharge Exam:  Vitals:   05/21/22 0500 05/21/22 0500 05/21/22 0753 05/21/22 1252  BP:  (!) 145/78  (!) 141/75  Pulse:  60  81  Resp:  17  14  Temp:  98.1 F (36.7 C)  98 F (36.7 C)  TempSrc:  Oral  Oral  SpO2:  94% 98% 95%  Weight: 46.7 kg     Height:        General: Pt is alert, awake, not in acute distress Cardiovascular: RRR, S1/S2 +, no rubs, no gallops Respiratory: CTA bilaterally, no wheezing, no rhonchi Abdominal: Soft, NT, ND, bowel sounds + Extremities: no edema, no cyanosis    The results of significant diagnostics from this hospitalization (including imaging, microbiology, ancillary and laboratory) are listed below for reference.     Microbiology: No results found for this or any previous visit (from the past 240 hour(s)).    Labs: BNP (last 3 results) Recent Labs    03/29/22 1406 05/19/22 1320 05/21/22 0345  BNP 474.3* 762.2* 235.3*   Basic Metabolic Panel: Recent Labs  Lab 05/15/22 1354 05/19/22 1320 05/20/22 0350 05/21/22 0345  NA 139 136 136 135  K 3.9 3.9 3.2* 3.7  CL 103 100 98 95*  CO2 30 24 30 31   GLUCOSE 116* 162* 156* 110*  BUN 25* 34* 38* 36*  CREATININE 1.18* 0.96 0.91 1.00  CALCIUM 9.0 9.1 8.9 8.8*  MG  --   --  2.1  --    Liver Function Tests: Recent Labs  Lab 05/15/22 1354 05/19/22 1320  AST 16 14*  ALT 19 20  ALKPHOS 42 38  BILITOT 0.9 1.2  PROT 5.6* 5.9*  ALBUMIN 3.5 3.9   No results for input(s): LIPASE, AMYLASE in the last 168 hours. No results for input(s): AMMONIA in the last 168 hours. CBC: Recent Labs  Lab 05/15/22 1302 05/19/22 1320 05/20/22 0350 05/21/22 0345  WBC 11.1* 8.5 5.6 12.4*  NEUTROABS 3.3 7.0  --   --   HGB 8.0* 7.4* 7.1* 9.2*  HCT 24.7* 22.7* 21.3* 28.6*  MCV 105.6* 104.1* 103.9* 101.8*  PLT 69* 82* 80* 81*   Cardiac Enzymes: No results for input(s): CKTOTAL, CKMB, CKMBINDEX, TROPONINI in the last 168 hours. BNP: Invalid input(s): POCBNP CBG: No results for input(s): GLUCAP in the last 168 hours. D-Dimer No results for input(s): DDIMER in the last 72 hours. Hgb A1c No results for input(s): HGBA1C in the last 72 hours. Lipid Profile No results for input(s): CHOL, HDL, LDLCALC, TRIG, CHOLHDL, LDLDIRECT in the last 72 hours. Thyroid function studies No results for input(s): TSH, T4TOTAL, T3FREE, THYROIDAB in the last 72 hours.  Invalid input(s): FREET3 Anemia work up No results for input(s): VITAMINB12, FOLATE, FERRITIN, TIBC, IRON, RETICCTPCT in the last 72 hours. Urinalysis    Component Value Date/Time   COLORURINE YELLOW 04/28/2011 1859   APPEARANCEUR CLEAR 04/28/2011 1859   LABSPEC 1.010 04/28/2011 1859   PHURINE 6.0 04/28/2011 1859   GLUCOSEU NEGATIVE 04/28/2011 1859   HGBUR NEGATIVE 04/28/2011 1859   BILIRUBINUR  NEGATIVE 04/28/2011 1859   KETONESUR NEGATIVE 04/28/2011 1859   PROTEINUR NEGATIVE 04/28/2011 1859   UROBILINOGEN 0.2 04/28/2011 1859   NITRITE NEGATIVE 04/28/2011 1859   LEUKOCYTESUR SMALL (A) 04/28/2011 1859   Sepsis Labs Invalid input(s): PROCALCITONIN,  WBC,  LACTICIDVEN Microbiology No results found for this or any previous visit (from the past 240 hour(s)).   Time coordinating discharge: 79mns  SIGNED:   JKathie Dike MD  [facility] 05/21/2022, 3:35 PM  If 7PM-7AM, please contact night-coverage [email or web address]

## 2022-05-21 NOTE — TOC Transition Note (Signed)
Transition of Care [facility]) - CM/SW Discharge Note   Patient Details  Name: [name] MRN: [id number] Date of Birth: 1946-08-15  Transition of [facility]) CM/SW Contact:  Ross Ludwig, LCSW Phone Number: 05/21/2022, 3:24 PM   Clinical Narrative:     CSW was informed that patient is currently open to home health.  CSW contacted patient's husband to find out which agency is currently following her.  Per patient's husband [facility] is following.  CSW contacted [place] and spoke to [place] who confirmed patient is receiving HH PT, OT, and RN.  CSW updated MD and requested new [place] orders for patient since she was admitted as an inpatient.  Physician put new orders in, patient plans to discharge home with home health with husband.   Final next level of care: [place] Barriers to Discharge: Barriers Resolved   Patient Goals and CMS Choice  Patient to return back home with home health.      Discharge Placement  Patient discharging back home with home health.                     Discharge Plan and Services                          HH Arranged: RN, PT, OT [facility] Agency: [place] Date [facility] Agency Contacted: 05/21/22 Time [place]: [id number] Representative spoke with at [place]: [place] ([place]) Interventions     Readmission Risk Interventions     View : No data to display.

## 2022-05-21 NOTE — Evaluation (Signed)
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  Found to be in decompensated CHF and admitted for IV diuretics.  Also noted to have worsening anemia and was transfused 1 unit PRBC.  Clinical Impression      Patient evaluated by Physical Therapy with no further acute PT needs identified. All education has been completed and the patient has no further questions.  Ms [name] is near or at her baseline for functional mobility, reports her husband can assist if needed. She has all DME including a chair lift they have had installed so she can get up to her second level to the full bath. No further needs at this time.   See below for any follow-up Physical Therapy or equipment needs. PT is signing off. Thank you for this referral.    Recommendations for follow up therapy are one component of a multi-disciplinary discharge planning process, led by the attending physician.  Recommendations may be updated based on patient status, additional functional criteria and insurance authorization.  Follow Up Recommendations  No f/u recommended     Assistance Recommended at Discharge PRN  Patient can return home with the following  Assistance with cooking/housework;Help with stairs or ramp for entrance;Assist for transportation    Equipment Recommendations None recommended by PT  Recommendations for Other Services       Functional Status Assessment Patient has not had a recent decline in their functional status     Precautions / Restrictions Precautions Precautions: Fall Precaution Comments: denies hx fall Restrictions Weight Bearing Restrictions: No      Mobility  Bed Mobility Overal bed mobility: Modified  Independent             General bed mobility comments: HOB elevated, no physical assist    Transfers Overall transfer level: Needs assistance Equipment used: Rolling walker (2 wheels) Transfers: Sit to/from Stand Sit to Stand: Supervision           General transfer comment: for safety on initial standing ( pt has not been OOB). no physical assist    Ambulation/Gait Ambulation/Gait assistance: Min guard, Supervision Gait Distance (Feet): 160 Feet Assistive device: Rolling walker (2 wheels) Gait Pattern/deviations: Step-through pattern, Decreased stride length, Narrow base of support       General Gait Details: supervision for safety, no LOB. pt reports slight knee soreness end of distance. no buckling or over instability  Stairs            Wheelchair Mobility    Modified Rankin (Stroke Patients Only)       Balance Overall balance assessment: Needs assistance Sitting-balance support: No upper extremity supported, Feet supported Sitting balance-Leahy Scale: Good     Standing balance support: During functional activity, Single extremity supported, Bilateral upper extremity supported, Reliant on assistive device for balance Standing balance-Leahy Scale: Fair Standing balance comment: able to static stand without UE support                             Pertinent Vitals/Pain Pain Assessment Pain Assessment: No/denies pain (some knee pain at baseline with incr activity)    Home Living Family/patient expects to be discharged to:: Private residence Living Arrangements: Spouse/significant other Available Help at Discharge: Family;Available 24  hours/day Type of Home: House Home Access: Stairs to enter Entrance Stairs-Rails: None Entrance Stairs-Number of Steps: 3 Alternate Level Stairs-Number of Steps: flight--has chair lift Home Layout: Two level;1/2 bath on main level Home Equipment: Conservation officer, nature (2 wheels);Shower seat;Hand held shower head       Prior Function Prior Level of Function : Independent/Modified Independent             Mobility Comments: amb with walker or no AD ADLs Comments: Mod I with ADLs per pt. slip on shoes, cooks for herself. husband can help as needed     Hand Dominance        Extremity/Trunk Assessment   Upper Extremity Assessment Upper Extremity Assessment: Overall WFL for tasks assessed    Lower Extremity Assessment Lower Extremity Assessment: Overall WFL for tasks assessed (notable atrophy bil LEs, WFL for purposes of basic mobiilty)       Communication   Communication: No difficulties  Cognition Arousal/Alertness: Awake/alert Behavior During Therapy: WFL for tasks assessed/performed Overall Cognitive Status: Within Functional Limits for tasks assessed                                          General Comments      Exercises     Assessment/Plan    PT Assessment Patient does not need any further PT services  PT Problem List         PT Treatment Interventions      PT Goals (Current goals can be found in the Care Plan section)  Acute Rehab PT Goals Patient Stated Goal: home soon PT Goal Formulation: All assessment and education complete, DC therapy    Frequency       Co-evaluation               AM-PAC PT "6 Clicks" Mobility  Outcome Measure Help needed turning from your back to your side while in a flat bed without using bedrails?: None Help needed moving from lying on your back to sitting on the side of a flat bed without using bedrails?: None   Help needed standing up from a chair using your arms (e.g., wheelchair or bedside chair)?: None Help needed to walk in hospital room?: A Little Help needed climbing 3-5 steps with a railing? : A Little 6 Click Score: 18    End of Session Equipment Utilized During Treatment: Gait belt Activity Tolerance: Patient tolerated treatment well Patient left: in chair;with call bell/phone within  reach;with chair alarm set;with family/visitor present   PT Visit Diagnosis: Difficulty in walking, not elsewhere classified (R26.2)    Time: 0354-6568 PT Time Calculation (min) (ACUTE ONLY): 19 min   Charges:   PT Evaluation $PT Eval Low Complexity: [place], PT  Acute Rehab Dept ([place]) [phone] Pager [phone]  05/21/2022   [facility] 05/21/2022, 2:58 PM

## 2022-05-21 NOTE — Progress Notes (Signed)
AVS given to patient and explained at the bedside. Medications and follow up appointments have been explained with pt verbalizing understanding.  

## 2022-05-21 NOTE — Plan of Care (Signed)
  Problem: Education: Goal: Ability to demonstrate management of disease process will improve Outcome: Progressing Goal: Ability to verbalize understanding of medication therapies will improve Outcome: Progressing   Problem: Education: Goal: Knowledge of General Education information will improve Description: Including pain rating scale, medication(s)/side effects and non-pharmacologic comfort measures Outcome: Progressing   Problem: Coping: Goal: Level of anxiety will decrease Outcome: Progressing   Problem: Elimination: Goal: Will not experience complications related to urinary retention Outcome: Progressing   Problem: Pain Managment: Goal: General experience of comfort will improve Outcome: Progressing   Problem: Safety: Goal: Ability to remain free from injury will improve Outcome: Progressing   Problem: Skin Integrity: Goal: Risk for impaired skin integrity will decrease Outcome: Progressing

## 2022-05-23 ENCOUNTER — Other Ambulatory Visit: Payer: Self-pay

## 2022-05-23 ENCOUNTER — Inpatient Hospital Stay: Payer: Medicare Other

## 2022-05-23 DIAGNOSIS — Z5111 Encounter for antineoplastic chemotherapy: Secondary | ICD-10-CM | POA: Diagnosis not present

## 2022-05-23 DIAGNOSIS — C931 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: Secondary | ICD-10-CM | POA: Diagnosis not present

## 2022-05-23 LAB — CBC WITH DIFFERENTIAL/PLATELET
Abs Immature Granulocytes: 0.07 10*3/uL (ref 0.00–0.07)
Basophils Absolute: 0 10*3/uL (ref 0.0–0.1)
Basophils Relative: 0 %
Eosinophils Absolute: 0 10*3/uL (ref 0.0–0.5)
Eosinophils Relative: 0 %
HCT: 28.9 % — ABNORMAL LOW (ref 36.0–46.0)
Hemoglobin: 9.4 g/dL — ABNORMAL LOW (ref 12.0–15.0)
Immature Granulocytes: 1 %
Lymphocytes Relative: 11 %
Lymphs Abs: 1.2 10*3/uL (ref 0.7–4.0)
MCH: 33.3 pg (ref 26.0–34.0)
MCHC: 32.5 g/dL (ref 30.0–36.0)
MCV: 102.5 fL — ABNORMAL HIGH (ref 80.0–100.0)
Monocytes Absolute: 0.9 10*3/uL (ref 0.1–1.0)
Monocytes Relative: 8 %
Neutro Abs: 9.1 10*3/uL — ABNORMAL HIGH (ref 1.7–7.7)
Neutrophils Relative %: 80 %
Platelets: 42 10*3/uL — ABNORMAL LOW (ref 150–400)
RBC: 2.82 MIL/uL — ABNORMAL LOW (ref 3.87–5.11)
RDW: 20.1 % — ABNORMAL HIGH (ref 11.5–15.5)
WBC: 11.3 10*3/uL — ABNORMAL HIGH (ref 4.0–10.5)
nRBC: 0 % (ref 0.0–0.2)

## 2022-05-23 LAB — CMP (CANCER CENTER ONLY)
ALT: 18 U/L (ref 0–44)
AST: 13 U/L — ABNORMAL LOW (ref 15–41)
Albumin: 3.6 g/dL (ref 3.5–5.0)
Alkaline Phosphatase: 46 U/L (ref 38–126)
Anion gap: 11 (ref 5–15)
BUN: 32 mg/dL — ABNORMAL HIGH (ref 8–23)
CO2: 29 mmol/L (ref 22–32)
Calcium: 9.5 mg/dL (ref 8.9–10.3)
Chloride: 96 mmol/L — ABNORMAL LOW (ref 98–111)
Creatinine: 1.05 mg/dL — ABNORMAL HIGH (ref 0.44–1.00)
GFR, Estimated: 55 mL/min — ABNORMAL LOW (ref >60)
Glucose, Bld: 236 mg/dL — ABNORMAL HIGH (ref 70–99)
Potassium: 3.5 mmol/L (ref 3.5–5.1)
Sodium: 136 mmol/L (ref 135–145)
Total Bilirubin: 1 mg/dL (ref 0.3–1.2)
Total Protein: 5.7 g/dL — ABNORMAL LOW (ref 6.5–8.1)

## 2022-05-23 NOTE — Telephone Encounter (Signed)
Pt developed generalized, total body rash with severe itching after beginning Digoxin in the hospital (AFIB w/ RVR). Hospital was treating her itching, but it progressed along with a rash, after discharge home. Medication was stopped on 05/17/22 as trial to clarify which drug was causing the rash. She saw a dermatologist and received topical steroids, but little-to-no relief. She had been increased on her other medications, but digoxin was the last newly-added drug. Appears the stop fixed the rash. Pt had ED admission on 05/19/22-05/21/22 for acute on chronic HFpEF and medication was stopped per AVS discharge paperwork. Pt's current medication regimen: Toprol XL 147m twice daily and Cardizem 360 daily.   Do we need to start something in place of Digoxin or just have pt continue to monitor HR?

## 2022-05-24 DIAGNOSIS — I130 Hypertensive heart and chronic kidney disease with heart failure and stage 1 through stage 4 chronic kidney disease, or unspecified chronic kidney disease: Secondary | ICD-10-CM | POA: Diagnosis not present

## 2022-05-24 DIAGNOSIS — D508 Other iron deficiency anemias: Secondary | ICD-10-CM | POA: Diagnosis not present

## 2022-05-24 DIAGNOSIS — C9210 Chronic myeloid leukemia, BCR/ABL-positive, not having achieved remission: Secondary | ICD-10-CM | POA: Diagnosis not present

## 2022-05-24 DIAGNOSIS — I5022 Chronic systolic (congestive) heart failure: Secondary | ICD-10-CM | POA: Diagnosis not present

## 2022-05-24 DIAGNOSIS — E440 Moderate protein-calorie malnutrition: Secondary | ICD-10-CM | POA: Diagnosis not present

## 2022-05-24 DIAGNOSIS — N1831 Chronic kidney disease, stage 3a: Secondary | ICD-10-CM | POA: Diagnosis not present

## 2022-05-29 ENCOUNTER — Inpatient Hospital Stay: Payer: Medicare Other | Attending: Nurse Practitioner

## 2022-05-29 ENCOUNTER — Other Ambulatory Visit: Payer: Self-pay

## 2022-05-29 DIAGNOSIS — Z5111 Encounter for antineoplastic chemotherapy: Secondary | ICD-10-CM | POA: Insufficient documentation

## 2022-05-29 DIAGNOSIS — C931 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: Secondary | ICD-10-CM | POA: Insufficient documentation

## 2022-05-29 DIAGNOSIS — Z79899 Other long term (current) drug therapy: Secondary | ICD-10-CM | POA: Insufficient documentation

## 2022-05-29 LAB — CMP (CANCER CENTER ONLY)
ALT: 17 U/L (ref 0–44)
AST: 15 U/L (ref 15–41)
Albumin: 3.7 g/dL (ref 3.5–5.0)
Alkaline Phosphatase: 60 U/L (ref 38–126)
Anion gap: 9 (ref 5–15)
BUN: 23 mg/dL (ref 8–23)
CO2: 29 mmol/L (ref 22–32)
Calcium: 8.7 mg/dL — ABNORMAL LOW (ref 8.9–10.3)
Chloride: 97 mmol/L — ABNORMAL LOW (ref 98–111)
Creatinine: 0.98 mg/dL (ref 0.44–1.00)
GFR, Estimated: >60 mL/min (ref >60)
Glucose, Bld: 125 mg/dL — ABNORMAL HIGH (ref 70–99)
Potassium: 4.2 mmol/L (ref 3.5–5.1)
Sodium: 135 mmol/L (ref 135–145)
Total Bilirubin: 0.9 mg/dL (ref 0.3–1.2)
Total Protein: 5.7 g/dL — ABNORMAL LOW (ref 6.5–8.1)

## 2022-05-29 LAB — CBC WITH DIFFERENTIAL/PLATELET
Abs Immature Granulocytes: 0.05 10*3/uL (ref 0.00–0.07)
Basophils Absolute: 0 10*3/uL (ref 0.0–0.1)
Basophils Relative: 1 %
Eosinophils Absolute: 0 10*3/uL (ref 0.0–0.5)
Eosinophils Relative: 0 %
HCT: 25.1 % — ABNORMAL LOW (ref 36.0–46.0)
Hemoglobin: 8.4 g/dL — ABNORMAL LOW (ref 12.0–15.0)
Immature Granulocytes: 1 %
Lymphocytes Relative: 12 %
Lymphs Abs: 0.7 10*3/uL (ref 0.7–4.0)
MCH: 34 pg (ref 26.0–34.0)
MCHC: 33.5 g/dL (ref 30.0–36.0)
MCV: 101.6 fL — ABNORMAL HIGH (ref 80.0–100.0)
Monocytes Absolute: 0.9 10*3/uL (ref 0.1–1.0)
Monocytes Relative: 15 %
Neutro Abs: 4.1 10*3/uL (ref 1.7–7.7)
Neutrophils Relative %: 71 %
Platelets: 46 10*3/uL — ABNORMAL LOW (ref 150–400)
RBC: 2.47 MIL/uL — ABNORMAL LOW (ref 3.87–5.11)
RDW: 20.1 % — ABNORMAL HIGH (ref 11.5–15.5)
WBC: 5.8 10*3/uL (ref 4.0–10.5)
nRBC: 0.3 % — ABNORMAL HIGH (ref 0.0–0.2)

## 2022-05-30 DIAGNOSIS — J9601 Acute respiratory failure with hypoxia: Secondary | ICD-10-CM | POA: Diagnosis not present

## 2022-05-30 DIAGNOSIS — I5033 Acute on chronic diastolic (congestive) heart failure: Secondary | ICD-10-CM | POA: Diagnosis not present

## 2022-05-30 DIAGNOSIS — R32 Unspecified urinary incontinence: Secondary | ICD-10-CM | POA: Diagnosis not present

## 2022-05-30 DIAGNOSIS — I130 Hypertensive heart and chronic kidney disease with heart failure and stage 1 through stage 4 chronic kidney disease, or unspecified chronic kidney disease: Secondary | ICD-10-CM | POA: Diagnosis not present

## 2022-05-30 DIAGNOSIS — I5022 Chronic systolic (congestive) heart failure: Secondary | ICD-10-CM | POA: Diagnosis not present

## 2022-05-30 DIAGNOSIS — D508 Other iron deficiency anemias: Secondary | ICD-10-CM | POA: Diagnosis not present

## 2022-05-30 DIAGNOSIS — K900 Celiac disease: Secondary | ICD-10-CM | POA: Diagnosis not present

## 2022-05-30 DIAGNOSIS — K5904 Chronic idiopathic constipation: Secondary | ICD-10-CM | POA: Diagnosis not present

## 2022-05-30 DIAGNOSIS — D630 Anemia in neoplastic disease: Secondary | ICD-10-CM | POA: Diagnosis not present

## 2022-05-30 DIAGNOSIS — Z96611 Presence of right artificial shoulder joint: Secondary | ICD-10-CM | POA: Diagnosis not present

## 2022-05-30 DIAGNOSIS — I2699 Other pulmonary embolism without acute cor pulmonale: Secondary | ICD-10-CM | POA: Diagnosis not present

## 2022-05-30 DIAGNOSIS — Z87891 Personal history of nicotine dependence: Secondary | ICD-10-CM | POA: Diagnosis not present

## 2022-05-30 DIAGNOSIS — K5730 Diverticulosis of large intestine without perforation or abscess without bleeding: Secondary | ICD-10-CM | POA: Diagnosis not present

## 2022-05-30 DIAGNOSIS — J159 Unspecified bacterial pneumonia: Secondary | ICD-10-CM | POA: Diagnosis not present

## 2022-05-30 DIAGNOSIS — C931 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: Secondary | ICD-10-CM | POA: Diagnosis not present

## 2022-05-30 DIAGNOSIS — R140 Abdominal distension (gaseous): Secondary | ICD-10-CM | POA: Diagnosis not present

## 2022-05-30 DIAGNOSIS — M069 Rheumatoid arthritis, unspecified: Secondary | ICD-10-CM | POA: Diagnosis not present

## 2022-05-30 DIAGNOSIS — D61818 Other pancytopenia: Secondary | ICD-10-CM | POA: Diagnosis not present

## 2022-05-30 DIAGNOSIS — J440 Chronic obstructive pulmonary disease with acute lower respiratory infection: Secondary | ICD-10-CM | POA: Diagnosis not present

## 2022-05-30 DIAGNOSIS — Z7952 Long term (current) use of systemic steroids: Secondary | ICD-10-CM | POA: Diagnosis not present

## 2022-05-30 DIAGNOSIS — D631 Anemia in chronic kidney disease: Secondary | ICD-10-CM | POA: Diagnosis not present

## 2022-05-30 DIAGNOSIS — N1831 Chronic kidney disease, stage 3a: Secondary | ICD-10-CM | POA: Diagnosis not present

## 2022-05-30 DIAGNOSIS — I480 Paroxysmal atrial fibrillation: Secondary | ICD-10-CM | POA: Diagnosis not present

## 2022-05-30 DIAGNOSIS — E440 Moderate protein-calorie malnutrition: Secondary | ICD-10-CM | POA: Diagnosis not present

## 2022-05-30 DIAGNOSIS — J441 Chronic obstructive pulmonary disease with (acute) exacerbation: Secondary | ICD-10-CM | POA: Diagnosis not present

## 2022-05-30 DIAGNOSIS — I429 Cardiomyopathy, unspecified: Secondary | ICD-10-CM | POA: Diagnosis not present

## 2022-05-30 DIAGNOSIS — D696 Thrombocytopenia, unspecified: Secondary | ICD-10-CM | POA: Diagnosis not present

## 2022-05-30 DIAGNOSIS — R634 Abnormal weight loss: Secondary | ICD-10-CM | POA: Diagnosis not present

## 2022-05-31 ENCOUNTER — Other Ambulatory Visit: Payer: Self-pay

## 2022-05-31 ENCOUNTER — Inpatient Hospital Stay: Payer: Medicare Other

## 2022-05-31 ENCOUNTER — Encounter: Payer: Self-pay | Admitting: Cardiovascular Disease

## 2022-05-31 ENCOUNTER — Telehealth: Payer: Self-pay | Admitting: Cardiovascular Disease

## 2022-05-31 ENCOUNTER — Inpatient Hospital Stay (HOSPITAL_BASED_OUTPATIENT_CLINIC_OR_DEPARTMENT_OTHER): Payer: Medicare Other | Admitting: Oncology

## 2022-05-31 VITALS — BP 107/58 | HR 102 | Temp 97.9°F | Resp 16 | Ht 63.0 in | Wt 113.0 lb

## 2022-05-31 DIAGNOSIS — D638 Anemia in other chronic diseases classified elsewhere: Secondary | ICD-10-CM | POA: Diagnosis not present

## 2022-05-31 DIAGNOSIS — D696 Thrombocytopenia, unspecified: Secondary | ICD-10-CM | POA: Diagnosis not present

## 2022-05-31 DIAGNOSIS — Z79899 Other long term (current) drug therapy: Secondary | ICD-10-CM | POA: Diagnosis not present

## 2022-05-31 DIAGNOSIS — C931 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: Secondary | ICD-10-CM

## 2022-05-31 DIAGNOSIS — Z5111 Encounter for antineoplastic chemotherapy: Secondary | ICD-10-CM | POA: Diagnosis not present

## 2022-05-31 LAB — CMP (CANCER CENTER ONLY)
ALT: 16 U/L (ref 0–44)
AST: 15 U/L (ref 15–41)
Albumin: 3.6 g/dL (ref 3.5–5.0)
Alkaline Phosphatase: 58 U/L (ref 38–126)
Anion gap: 9 (ref 5–15)
BUN: 18 mg/dL (ref 8–23)
CO2: 30 mmol/L (ref 22–32)
Calcium: 9 mg/dL (ref 8.9–10.3)
Chloride: 98 mmol/L (ref 98–111)
Creatinine: 0.92 mg/dL (ref 0.44–1.00)
GFR, Estimated: >60 mL/min (ref >60)
Glucose, Bld: 121 mg/dL — ABNORMAL HIGH (ref 70–99)
Potassium: 3.5 mmol/L (ref 3.5–5.1)
Sodium: 137 mmol/L (ref 135–145)
Total Bilirubin: 0.7 mg/dL (ref 0.3–1.2)
Total Protein: 5.6 g/dL — ABNORMAL LOW (ref 6.5–8.1)

## 2022-05-31 LAB — CBC WITH DIFFERENTIAL/PLATELET
Abs Immature Granulocytes: 0.05 10*3/uL (ref 0.00–0.07)
Basophils Absolute: 0 10*3/uL (ref 0.0–0.1)
Basophils Relative: 0 %
Eosinophils Absolute: 0.2 10*3/uL (ref 0.0–0.5)
Eosinophils Relative: 3 %
HCT: 26.3 % — ABNORMAL LOW (ref 36.0–46.0)
Hemoglobin: 8.6 g/dL — ABNORMAL LOW (ref 12.0–15.0)
Immature Granulocytes: 1 %
Lymphocytes Relative: 16 %
Lymphs Abs: 0.7 10*3/uL (ref 0.7–4.0)
MCH: 34.5 pg — ABNORMAL HIGH (ref 26.0–34.0)
MCHC: 32.7 g/dL (ref 30.0–36.0)
MCV: 105.6 fL — ABNORMAL HIGH (ref 80.0–100.0)
Monocytes Absolute: 0.9 10*3/uL (ref 0.1–1.0)
Monocytes Relative: 19 %
Neutro Abs: 2.7 10*3/uL (ref 1.7–7.7)
Neutrophils Relative %: 61 %
Platelets: 52 10*3/uL — ABNORMAL LOW (ref 150–400)
RBC: 2.49 MIL/uL — ABNORMAL LOW (ref 3.87–5.11)
RDW: 21 % — ABNORMAL HIGH (ref 11.5–15.5)
WBC: 4.5 10*3/uL (ref 4.0–10.5)
nRBC: 0.7 % — ABNORMAL HIGH (ref 0.0–0.2)

## 2022-05-31 MED ORDER — DILTIAZEM HCL ER COATED BEADS 360 MG PO CP24: 360.0000 mg | ORAL_CAPSULE | Freq: Every day | ORAL | 3 refills | Status: DC

## 2022-05-31 NOTE — Telephone Encounter (Signed)
*  STAT* If patient is at the pharmacy, call can be transferred to refill team.   1. Which medications need to be refilled? (please list name of each medication and dose if known) diltiazem (CARDIZEM CD) 360 MG 24 hr capsule  2. Which pharmacy/location (including street and city if local pharmacy) is medication to be sent to? [place], [place]  3. Do they need a 30 day or 90 day supply? 90 day  Patient is out of medication.

## 2022-05-31 NOTE — Telephone Encounter (Signed)
Refill sent to pharmacy on file

## 2022-05-31 NOTE — Telephone Encounter (Signed)
Pt is requesting a refill on diltiazem 360 mg capsule. Dr. Acie Fredrickson did not prescribe this medication. Would Dr. Acie Fredrickson like to refill this medication? Please address

## 2022-05-31 NOTE — Progress Notes (Signed)
[place]   [phone] [phone]   Clinic Follow up Note   Patient Care Team: [name], MD as PCP - General (Family Medicine) [name], Wonda Cheng, MD as PCP - Cardiology (Cardiology) Gavin Pound, MD as Consulting Physician (Rheumatology) Truitt Merle, MD as Consulting Physician (Hematology) Juanita Craver, MD as Consulting Physician (Gastroenterology) Garner Nash, DO as Consulting Physician (Pulmonary Disease) 05/31/2022  CHIEF COMPLAINT: Follow-up CMML and recent hospitalization  SUMMARY OF ONCOLOGIC HISTORY: Oncology History  CMML (chronic myelomonocytic leukemia) ([place])  11/09/2021 Initial Biopsy   DIAGNOSIS:   -  Monoclonal B-cell population with co-expression of CD5 comprises 17%  of all lymphocytes  -  See comment   COMMENT:  In addition to the clonal B-cell population, there is a myeloblast  population (CD34, CD38, HLA-DR, CD117, CD123 and CD33) that comprises 2% of the total cellular events.  Please see concurrent tissue biopsy (below) for additional work-up and final diagnosis.    FINAL MICROSCOPIC DIAGNOSIS:   A. SOFT TISSUE MASS, PRE SACRAL, NEEDLE CORE BIOPSY:  -  Chronic lymphocytic leukemia/small lymphocytic lymphoma  -  Extra medullary hematopoiesis  -  See comment   COMMENT:  The biopsy consists of multiple soft tissue cores with lymphoid nodules and a dense hematopoietic infiltrate consistent with extra medullary hematopoiesis.  MPO and E-cadherin highlight myeloid and erythroid precursors respectively.  CD34 highlights increased vasculature and is also positive within the cytoplasm of megakaryocytes.  A few small, immature mononuclear cells appear to be positive for CD34 and CD117. TdT shows rare, scattered positive cells.  CD20 highlights aggregates of B cells which are admixed with CD3 positive T cells.  T cells are an admixture of CD4 and CD8.  The B cells are also positive for CD5, CD23 and Bcl-2.  The B cells do  not show significant staining for CD10, BCL6 or cyclin D1.  CD138 highlights scattered plasma cells which are polytypic by kappa and lambda in situ hybridization.  Flow cytometry performed on the sample (see WL [id number]) identified a kappa restricted CD5 positive B-cell population comprising 70% of lymphocytes.  In addition, a small myeloblast population comprised 2% of the total cellular events.   Overall, the findings are consistent with soft tissue involvement by  chronic lymphocytic leukemia/small lymphocytic lymphoma and extra medullary hematopoiesis. In reviewing the patient's CBC data (macrocytic anemia and thrombocytopenia), I would recommend a bone marrow biopsy to assess for marrow involvement by CLL/SLL.    12/23/2021 Imaging   EXAM: CT CHEST, ABDOMEN, AND PELVIS WITH CONTRAST  IMPRESSION: 1. Slight interval enlargement of a presacral soft tissue mass measuring 7.2 x 4.7 cm, previously 6.9 x 4.1 cm on prior MR of the pelvis dated 07/04/2021. By report, this represents a biopsy proven lymphoma. 2. Pleural nodule of the dependent right lower lobe overlying the posterior right tenth rib and pleural or paraspinous soft tissue mass overlying the right aspect of the T10 vertebral body, very slightly enlarged compared to prior examination of the chest dated 06/25/2020, consistent with additional sites of lymphomatous involvement given very indolent growth. These could be better assessed for metabolic activity by FDG PET/CT if desired. 3. There is mild, bibasilar predominant pulmonary fibrosis in a pattern featuring irregular peripheral interstitial opacity, septal thickening, but without clear evidence of subpleural bronchiolectasis or honeycombing, with a somewhat asymmetric distribution most conspicuously involving the right lower lobe and lingula. These findings are significantly worsened when compared to prior examination dated 06/25/2020, particularly in the right lower  lobe.  Given interval change, this may reflect sequelae of interval infection or aspiration, however appearance is generally suspicious for fibrotic interstitial lung disease, and if characterized by ATS pulmonary fibrosis is in an "indeterminate for UIP" pattern, differential considerations including both UIP and NSIP. 4. Emphysema.   Aortic Atherosclerosis (ICD10-I70.0) and Emphysema (ICD10-J43.9).   01/05/2022 Pathology Results   DIAGNOSIS:   BONE MARROW, ASPIRATE, CLOT, CORE:  -Hypercellular bone marrow for age with features of  myelodysplastic/myeloproliferative neoplasm  -Minor abnormal B-cell population  -See comment   PERIPHERAL BLOOD:  -Macrocytic anemia  -Neutrophilic left shift and monocytosis  -Thrombocytopenia   COMMENT:  The bone marrow is hypercellular for age with dyspoietic changes  involving myeloid cell lines associated with monocytosis and increased number of blastic cells (12%) as primarily seen by morphology, many of which display monocytic features.  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  In this background, there are several predominantly small lymphoid aggregates mostly composed of small lymphoid cells.  By flow cytometry, a minor abnormal B-cell population expressing CD5 is seen and representing 2% of all cells.  This correlate with previously known B-cell lymphoproliferative process.  Correlation with cytogenetic and FISH studies is strongly recommended.    DIAGNOSIS:   -Increased number of monocytic cells present (25%)  -Minor abnormal B-cell population identified.  -See comment   COMMENT:  Flow cytometric analysis shows increased number of monocytic cells representing 25% of all cells but without aberrant phenotype or CD34 expression.  A significant CD34-positive blastic population is  not identified.  The lymphoid population shows a minor B-cell population representing 2% of all cells and expressing B-cell antigens including CD20 associated with CD5, CD200 and possibly dim kappa expression.  The latter findings are abnormal and correlate with previously known B-cell lymphoproliferative process.  No significant T-cell phenotypic abnormalities identified.    01/12/2022 Initial Diagnosis   CMML (chronic myelomonocytic leukemia) ([place])    01/12/2022 Cancer Staging   Staging form: Chronic Myeloid Leukemia, AJCC 8th Edition - Clinical stage from 01/12/2022: Bone marrow blast count (%): 12, Additional clonal changes: Unknown - Signed by Truitt Merle, MD on 01/12/2022 Stage prefix: Initial diagnosis    02/06/2022 -  Chemotherapy   Patient is on Treatment Plan : MYELODYSPLASIA  Azacitidine IV D1-7 q28d        CURRENT THERAPY: Previously on Vidaza x5 days monthly  INTERVAL HISTORY: [name] is a 76 year old female who returns for follow-up after restarting treatment with Vidaza and hospitalization. Started cycle 3 on 05/15/2022 and completed 4 doses prior to being admitted to the hospital due to symptomatic anemia and heart failure exacerbation.  [name] received a unit of PRBC and given IV Lasix with improvement of [name] symptoms.  Since discharge [name] has felt significantly better.  Complains of right knee pain that affects [name] ability to get around.  Denies any recent falls.  Appetite is improving.  Has been having some abdominal pain but states [name] PCP recently took [name] off [name] bowel stimulant which has helped with the pain.    MEDICAL HISTORY:  Past Medical History:  Diagnosis Date   Arthritis    Rheumatoid arthritis   Celiac disease    Chronic kidney disease    stage 3 from MD notes   COPD (chronic obstructive pulmonary disease) ([place])    Dyspnea    with going up stairs   Family history of  adverse reaction to anesthesia    father had hard time waking up   Headache    sinus  headaches   Hot flashes    Hypertension    Iron deficiency anemia    Pneumonia    per patient "I have walking pneumonia"    SURGICAL HISTORY: Past Surgical History:  Procedure Laterality Date   COLONOSCOPY     ECTOPIC PREGNANCY SURGERY      x 2   IR IMAGING GUIDED PORT INSERTION  01/31/2022   REVERSE SHOULDER ARTHROPLASTY Right 02/02/2017   Procedure: RIGHT REVERSE SHOULDER ARTHROPLASTY;  Surgeon: Netta Cedars, MD;  Location: [facility];  Service: Orthopedics;  Laterality: Right;    I have reviewed the social history and family history with the patient and they are unchanged from previous note.  ALLERGIES:  is allergic to digoxin and related, gluten meal, penicillins, alendronate, and hydroxychloroquine.  MEDICATIONS:  Current Outpatient Medications  Medication Sig Dispense Refill   acetaminophen (TYLENOL) 500 MG tablet Take 1,000 mg by mouth in the morning.     CALCIUM 600/VITAMIN D 600-10 MG-MCG CHEW Chew 2 tablets by mouth daily.     denosumab (PROLIA) 60 MG/ML SOSY injection Inject 60 mg into the skin every 6 (six) months.     diltiazem (CARDIZEM CD) 360 MG 24 hr capsule Take 1 capsule (360 mg total) by mouth daily. 90 capsule 3   furosemide (LASIX) 20 MG tablet Take 1 tablet (20 mg total) by mouth daily. 30 tablet 11   gabapentin (NEURONTIN) 300 MG capsule Take 300 mg by mouth in the morning.     InFLIXimab (REMICADE IV) Inject 10 mg into the vein every 2 (two) months.     lidocaine-prilocaine (EMLA) cream Apply to affected area once 30 g 3   metoprolol succinate (TOPROL-XL) 50 MG 24 hr tablet Take 3 tablets (150 mg total) by mouth 2 (two) times daily. 105 tablet 1   ondansetron (ZOFRAN) 8 MG tablet Take 1 tablet (8 mg total) by mouth every 8 (eight) hours as needed for nausea or vomiting. 20 tablet 1   predniSONE (DELTASONE) 5 MG tablet Take 5 mg by mouth daily with breakfast.     Tiotropium Bromide-Olodaterol (STIOLTO RESPIMAT) 2.5-2.5 MCG/ACT AERS Inhale 2 puffs into the lungs  daily.     vitamin C (ASCORBIC ACID) 500 MG tablet Take 500 mg by mouth daily.     No current facility-administered medications for this visit.   Facility-Administered Medications Ordered in Other Visits  Medication Dose Route Frequency Provider Last Rate Last Admin   sodium chloride flush (NS) 0.9 % injection 10 mL  10 mL Intracatheter PRN Truitt Merle, MD   10 mL at 03/14/22 1709    PHYSICAL EXAMINATION: ECOG PERFORMANCE STATUS: 1 - Symptomatic but completely ambulatory  Vitals:   05/31/22 1052  BP: (!) 107/58  Pulse: (!) 102  Resp: 16  Temp: 97.9 F (36.6 C)  SpO2: 99%   Filed Weights   05/31/22 1052  Weight: 113 lb (51.3 kg)   Physical Exam Constitutional:      Appearance: Normal appearance.  Cardiovascular:     Rate and Rhythm: Normal rate and regular rhythm.  Pulmonary:     Effort: Pulmonary effort is normal.     Breath sounds: Normal breath sounds.  Abdominal:     General: Abdomen is flat. Bowel sounds are normal.     Palpations: Abdomen is soft.  Neurological:     Mental Status: [name] is alert and oriented  to person, place, and time.    LABORATORY DATA:  I have reviewed the data as listed    Latest Ref Rng & Units 05/31/2022   10:36 AM 05/29/2022    1:13 PM 05/23/2022    2:03 PM  CBC  WBC 4.0 - 10.5 K/uL 4.5   5.8   11.3    Hemoglobin 12.0 - 15.0 g/dL 8.6   8.4   9.4    Hematocrit 36.0 - 46.0 % 26.3   25.1   28.9    Platelets 150 - 400 K/uL 52   46   42          Latest Ref Rng & Units 05/29/2022    1:13 PM 05/23/2022    2:03 PM 05/21/2022    3:45 AM  CMP  Glucose 70 - 99 mg/dL 125   236   110    BUN 8 - 23 mg/dL 23   32   36    Creatinine 0.44 - 1.00 mg/dL 0.98   1.05   1.00    Sodium 135 - 145 mmol/L 135   136   135    Potassium 3.5 - 5.1 mmol/L 4.2   3.5   3.7    Chloride 98 - 111 mmol/L 97   96   95    CO2 22 - 32 mmol/L 29   29   31     Calcium 8.9 - 10.3 mg/dL 8.7   9.5   8.8    Total Protein 6.5 - 8.1 g/dL 5.7   5.7     Total Bilirubin 0.3 - 1.2  mg/dL 0.9   1.0     Alkaline Phos 38 - 126 U/L 60   46     AST 15 - 41 U/L 15   13     ALT 0 - 44 U/L 17   18         RADIOGRAPHIC STUDIES: I have personally reviewed the radiological images as listed and agreed with the findings in the report. No results found.   ASSESSMENT & PLAN: [name] is a 76 y.o. female with    1. 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 SPEP and UPEP with immunofixation were negative. No lab evidence of hemolysis, erythropoietin level is normal.  -[name] prior bone marrow biopsy in 2016 was negative except several small lymphoid aggregation, suspicious for low-grade B cell lymphoproliferative process, especially SLL. [name] peripheral white count has been normal, no elevated lymphocytes -CT scan from 03/2015 was negative for adenopathy or splenomegaly. no B symptoms  -[name] has been under observation -[name] developed worsening back pain in Summer 2022 and underwent work up, pelvic MRI 07/04/21 showed a mass in the presacral space. -bone marrow biopsy on 01/05/22 showed hypercellular marrow with features of myelodysplastic/myeloproliferative neoplasm, increased 12% blasts, most consistent with CMML-2. [name] BM biopsy was reviewed at [facility] and was felt to be CMML-1 with 5% blasts. [name] previous presacral soft tissue biopsy which showed same CMML as [name] bone marrow biopsy. -[name] met Dr. Linus Orn at [facility] on 01/26/22. Vidaza was also recommended  -[name] began azacitadine daily for 7 days (5+2) every 28 days, starting 02/06/22. [name] has completed 2 cycles -[name] was hospitalized 03/29/2022 - 04/13/2022 and went to 2 weeks of rehab.  Oncology work-up during hospitalization showed stable chronic presacral soft tissue tumor, no other new imaging findings.  Repeat bone marrow biopsy showed persistent CMML with only 2% blasts, slightly lower than before.  There is no other evidence of disease  progression in the marrow.  -Started cycle 3 Vidaza on 05/15/22 and received 4 of 5 treatments prior to being hospitalized for symptomatic anemia, acute respiratory failure and heart failure exacerbation. -Treated with IV Lasix and given 1 unit PRBC.  On inhaled corticosteroids. -Chest x-ray showed bilateral pleural effusion left greater than right. -Reviewed lab work with patient.  Hemoglobin is stable at 8.6.  Platelet count 52,000 which is an improvement from last week.  [name] is scheduled to return to clinic next week for labs only and in 2 weeks for labs and cycle 4 of Vidaza.  Thrombocytopenia- -Improvement of platelet count to 52,000 (46,000) today.   -No active bleeding . -Continue to monitor.  Anemia- -Has received several units of PRBC in the past. -Current hemoglobin is 8.6 (8.4).   -Likely secondary to treatment and chronic comorbidities. -Monitor for now.  Plan: -Reviewed recent hospitalization. -Reviewed labs with patient -Continue labs weekly. -Return to clinic in 2 weeks for cycle 4 Vidaza.  I spent 25 minutes dedicated to the care of this patient (face-to-face and non-face-to-face) on the date of the encounter to include what is described in the assessment and plan.     Jacquelin Hawking, NP 05/31/22

## 2022-06-01 ENCOUNTER — Ambulatory Visit (INDEPENDENT_AMBULATORY_CARE_PROVIDER_SITE_OTHER): Payer: Medicare Other | Admitting: Nurse Practitioner

## 2022-06-01 ENCOUNTER — Encounter: Payer: Self-pay | Admitting: Nurse Practitioner

## 2022-06-01 VITALS — BP 102/74 | HR 88 | Ht 63.0 in | Wt 112.2 lb

## 2022-06-01 DIAGNOSIS — I480 Paroxysmal atrial fibrillation: Secondary | ICD-10-CM

## 2022-06-01 DIAGNOSIS — E876 Hypokalemia: Secondary | ICD-10-CM

## 2022-06-01 DIAGNOSIS — J449 Chronic obstructive pulmonary disease, unspecified: Secondary | ICD-10-CM

## 2022-06-01 DIAGNOSIS — D638 Anemia in other chronic diseases classified elsewhere: Secondary | ICD-10-CM | POA: Diagnosis not present

## 2022-06-01 MED ORDER — METOPROLOL SUCCINATE ER 50 MG PO TB24: 150.0000 mg | ORAL_TABLET | Freq: Two times a day (BID) | ORAL | 3 refills | Status: DC

## 2022-06-01 NOTE — Patient Instructions (Addendum)
Medication Instructions:  Your physician recommends that you continue on your current medications as directed. Please refer to the Current Medication list given to you today.  *If you need a refill on your cardiac medications before your next appointment, please call your pharmacy*  Lab Work: If you have labs (blood work) drawn today and your tests are completely normal, you will receive your results only by: [place] (if you have MyChart) OR A paper copy in the mail If you have any lab test that is abnormal or we need to change your treatment, we will call you to review the results.  Follow-Up: At [facility], you and your health needs are our priority.  As part of our continuing mission to provide you with exceptional heart care, we have created designated Provider Care Teams.  These Care Teams include your primary Cardiologist (physician) and Advanced Practice Providers (APPs -  Physician Assistants and Nurse Practitioners) who all work together to provide you with the care you need, when you need it.  We recommend signing up for the patient portal called "MyChart".  Sign up information is provided on this After Visit Summary.  MyChart is used to connect with patients for Virtual Visits (Telemedicine).  Patients are able to view lab/test results, encounter notes, upcoming appointments, etc.  Non-urgent messages can be sent to your provider as well.   To learn more about what you can do with MyChart, go to [email or web address].    Your next appointment:   3 month(s)  The format for your next appointment:   In Person  Provider:   Mertie Moores, MD {  Important Information About Sugar

## 2022-06-01 NOTE — Progress Notes (Signed)
Cardiology Office Note:    Date:  06/02/2022   ID:  [name], DOB 07-Aug-1946, MRN [id number]  PCP:  [name], MD   [facility] Providers Cardiologist:  Mertie Moores, MD     Referring MD: [name], MD   Chief Complaint: hospital follow-up CHF  History of Present Illness:    [name] is a pleasant 76 y.o. female with a hx of CKD, COPD, PAF, hyperlipidemia, CKD, CMML, and RA.   She was seen in consult by Dr. Acie Fredrickson October 2022 for atrial fibrillation after referral from primary care.  She was overall unaware of her atrial fibrillation and not yet anticoagulated.  There was notation of previous GI bleed and colonoscopy by PCP.  She was started on Eliquis 5 mg twice daily and echocardiogram obtained 10/27/2021 revealed normal LVEF 60 to 65%, no RWMA, normal diastolic parameters, LA moderately dilated, RA mildly dilated, mild MR.  She was seen by Dr. Acie Fredrickson 01/10/22 for clearance for colonoscopy.  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  She had an episode of rapid atrial fibrillation on 03/14/2022 at oncology office. EKG atrial fib 150 bpm with heart rate per documentation 40 bpm to 150 bpm.  She was instructed to take extra 50 mg metoprolol in the evening.  When seen 03/20/2022 noted episode of blood in toilet bowl and given hemoglobin 7.7, platelet 22 K she was given 1 unit PRBC, 1 unit platelets.  She was advised to take Eliquis 5 mg only once daily by oncology.  Seen by Laurann Montana, NP on 03/24/22 and 3 day Zio monitor was placed to assess a fib burden. She was admitted to [facility] with difficult to control a fib and newly reduced LVEF.  She did not tolerate amiodarone due to prolonged QTc, not a candidate for TEE/DCCV due to off heparin for concern of bleeding.  Heart rate eventually controlled with high-dose Toprol, and digoxin.    She was last seen in our office by Dr. Acie Fredrickson on 04/28/22 following a lengthy  hospital admission and transfer to rehab. She reported drinking a bottle of wine daily and was asked to reduce this as it was felt to contribute to rapid a fib.  She was advised to follow-up in 6 months.  Not on anticoagulation due to thrombocytopenia.   Admission 5/26-5/28/23 for shortness of breath, found to be in decompensated CHF and worsening anemia.  BNP 762 on admission, oxygen saturations 85% on room air with associated tachypnea.  Digoxin discontinued recently due to concerns for rash.  Discharged on diltiazem 360 mg, furosemide 20 mg ,Toprol 150 mg twice daily.  Today, she is here with her husband. She is in a wheelchair. She reports gradual weight gain since coming home from hospital. Mild bilateral lower extremity edema. Denies orthopnea and PND. She is a poor historian and does not initially clearly explain her symptoms.  When I explained the importance of careful monitoring of fluid overload, she reports she is feeling better since hospital discharge and feels that her breathing and edema has improved. She denies chest pain, palpitations, melena, hematuria, hemoptysis, diaphoresis, weakness, presyncope, and syncope.   Past Medical History:  Diagnosis Date   Arthritis    Rheumatoid arthritis   Celiac disease    Chronic kidney disease    stage 3 from MD notes   COPD (chronic obstructive pulmonary disease) (HCC)    Dyspnea    with going up stairs  Family history of adverse reaction to anesthesia    father had hard time waking up   Headache    sinus headaches   Hot flashes    Hypertension    Iron deficiency anemia    Pneumonia    per patient "I have walking pneumonia"    Past Surgical History:  Procedure Laterality Date   COLONOSCOPY     ECTOPIC PREGNANCY SURGERY      x 2   IR IMAGING GUIDED PORT INSERTION  01/31/2022   REVERSE SHOULDER ARTHROPLASTY Right 02/02/2017   Procedure: RIGHT REVERSE SHOULDER ARTHROPLASTY;  Surgeon: Netta Cedars, MD;  Location: [place];  Service:  Orthopedics;  Laterality: Right;    Current Medications: Current Meds  Medication Sig   acetaminophen (TYLENOL) 500 MG tablet Take 1,000 mg by mouth in the morning.   denosumab (PROLIA) 60 MG/ML SOSY injection Inject 60 mg into the skin every 6 (six) months.   diltiazem (CARDIZEM CD) 360 MG 24 hr capsule Take 1 capsule (360 mg total) by mouth daily.   furosemide (LASIX) 20 MG tablet Take 1 tablet (20 mg total) by mouth daily.   gabapentin (NEURONTIN) 300 MG capsule Take 300 mg by mouth in the morning.   InFLIXimab (REMICADE IV) Inject 10 mg into the vein every 2 (two) months.   predniSONE (DELTASONE) 5 MG tablet Take 5 mg by mouth daily with breakfast.   Tiotropium Bromide-Olodaterol (STIOLTO RESPIMAT) 2.5-2.5 MCG/ACT AERS Inhale 2 puffs into the lungs daily.   [DISCONTINUED] metoprolol succinate (TOPROL-XL) 50 MG 24 hr tablet Take 3 tablets (150 mg total) by mouth 2 (two) times daily.     Allergies:   Digoxin and related, Gluten meal, Penicillins, Alendronate, and Hydroxychloroquine   Social History   Socioeconomic History   Marital status: Married    Spouse name: Not on file   Number of children: Not on file   Years of education: Not on file   Highest education level: Not on file  Occupational History   Not on file  Tobacco Use   Smoking status: Former    Packs/day: 1.00    Years: 30.00    Total pack years: 30.00    Types: Cigarettes    Quit date: 2005    Years since quitting: 18.4   Smokeless tobacco: Never  Vaping Use   Vaping Use: Never used  Substance and Sexual Activity   Alcohol use: Yes    Alcohol/week: 12.0 - 14.0 standard drinks of alcohol    Types: 12 - 14 Glasses of wine per week   Drug use: No   Sexual activity: Not on file  Other Topics Concern   Not on file  Social History Narrative   Not on file   Social Determinants of Health   Financial Resource Strain: Not on file  Food Insecurity: Not on file  Transportation Needs: Not on file  Physical  Activity: Not on file  Stress: Not on file  Social Connections: Not on file     Family History: The patient's family history includes Diabetes in her father; Peripheral Artery Disease in her father.  ROS:   Please see the history of present illness.   All other systems reviewed and are negative.  Labs/Other Studies Reviewed:    The following studies were reviewed today:  Cardiac Monitor 04/05/22 Atrial fib Single episode of nonsustained Ventricular tachycardia ( 17 beats , avg HR was 202)   Echo 03/30/22  1. Left ventricular ejection fraction, by estimation, is 45  to 50%. Left  ventricular ejection fraction by 2D MOD biplane is 48.0 %. The left  ventricle has mildly decreased function. The left ventricle demonstrates  global hypokinesis. Left ventricular  diastolic function could not be evaluated.   2. Right ventricular systolic function is mildly reduced. The right  ventricular size is normal. There is mildly elevated pulmonary artery  systolic pressure. The estimated right ventricular systolic pressure is  20.3 mmHg.   3. The mitral valve is abnormal. Mild to moderate mitral valve  regurgitation.   4. The aortic valve is tricuspid. Aortic valve regurgitation is not  visualized. Aortic valve sclerosis is present, with no evidence of aortic  valve stenosis.   5. The inferior vena cava is dilated in size with <50% respiratory  variability, suggesting right atrial pressure of 15 mmHg.   Comparison(s): Changes from prior study are noted. 10/27/2021: LVEF 60-65%.   Recent Labs: 03/29/2022: TSH 0.731 05/20/2022: Magnesium 2.1 05/21/2022: B Natriuretic Peptide 148.4 05/31/2022: ALT 16; BUN 18; Creatinine 0.92; Hemoglobin 8.6; Platelets 52; Potassium 3.5; Sodium 137  Recent Lipid Panel No results found for: "CHOL", "TRIG", "HDL", "CHOLHDL", "VLDL", "LDLCALC", "LDLDIRECT"   Risk Assessment/Calculations:    CHA2DS2-VASc Score = 4  {This indicates a 4.8% annual risk of stroke. The  patient's score is based upon: CHF History: 0 HTN History: 1 Diabetes History: 0 Stroke History: 0 Vascular Disease History: 0 Age Score: 2 Gender Score: 1    Physical Exam:    VS:  BP 102/74   Pulse 88   Ht 5' 3"  (1.6 m)   Wt 112 lb 3.2 oz (50.9 kg)   SpO2 99%   BMI 19.88 kg/m     Wt Readings from Last 3 Encounters:  06/01/22 112 lb 3.2 oz (50.9 kg)  05/31/22 113 lb (51.3 kg)  05/21/22 102 lb 15.3 oz (46.7 kg)     GEN: Chronically ill appearing female in no acute distress HEENT: Normal NECK: No JVD; No carotid bruits CARDIAC: Irregular RR, no murmurs, rubs, gallops RESPIRATORY:  Diminished bilaterally without rales, wheezing, crackles or rhonchi, no tachypnea ABDOMEN: Soft, non-tender, non-distended MUSCULOSKELETAL:  Mild bilateral LE edema, non-pitting. No deformity. 2+ pedal pulses, equal bilaterally SKIN: Warm and dry NEUROLOGIC:  Alert and oriented x 3 PSYCHIATRIC:  Flat affect   EKG:  EKG is not ordered today.   Diagnoses:    1. Chronic obstructive pulmonary disease, unspecified COPD type ([place])   2. Paroxysmal atrial fibrillation ([place])   3. Anemia of chronic disease   4. Hypokalemia    Assessment and Plan:     PAF: Heart rate irregular on exam, rate is well-controlled. She is not on anticoagulation in the setting of thrombocytopenia and previous issues with bleeding.  Hemoglobin 8.6 checked by oncology on 6/7. Continue to hold DOAC.  Continue diltiazem, metoprolol.   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 She responded well to diuretics during hospitalization. Appears euvolemic on exam. Continue furosemide. Pleural effusion on CXR 5/26, no significant dyspnea, orthopnea, PND on exam, lung sounds are diminished equally bilaterally without crackles. Encouraged low sodium diet and 64 oz fluid restriction. Advised her to closely monitor symptoms of worsening dyspnea, edema, orthopnea, or PND. And to monitor  daily weight and may take additional furosemide 20 mg if weigh > 2 lbs in 24 hours or > 5 lbs in one week.  Anemia: Closely followed by oncology with lab work on 6/7.   COPD: She feels that  her dyspnea is stable. No concerns since hospital discharge. No wheezing on exam.   Hypokalemia: Normal K+ on 6/7. She needs oral supplementation at 20 mEq daily.   Disposition:   3 months with Dr. Acie Fredrickson   Medication Adjustments/Labs and Tests Ordered: Current medicines are reviewed at length with the patient today.  Concerns regarding medicines are outlined above.  No orders of the defined types were placed in this encounter.  Meds ordered this encounter  Medications   metoprolol succinate (TOPROL-XL) 50 MG 24 hr tablet    Sig: Take 3 tablets (150 mg total) by mouth 2 (two) times daily.    Dispense:  180 tablet    Refill:  3    Patient Instructions  Medication Instructions:  Your physician recommends that you continue on your current medications as directed. Please refer to the Current Medication list given to you today.  *If you need a refill on your cardiac medications before your next appointment, please call your pharmacy*  Lab Work: If you have labs (blood work) drawn today and your tests are completely normal, you will receive your results only by: [place] (if you have MyChart) OR A paper copy in the mail If you have any lab test that is abnormal or we need to change your treatment, we will call you to review the results.  Follow-Up: At [facility], you and your health needs are our priority.  As part of our continuing mission to provide you with exceptional heart care, we have created designated Provider Care Teams.  These Care Teams include your primary Cardiologist (physician) and Advanced Practice Providers (APPs -  Physician Assistants and Nurse Practitioners) who all work together to provide you with the care you need, when you need it.  We recommend signing up for  the patient portal called "MyChart".  Sign up information is provided on this After Visit Summary.  MyChart is used to connect with patients for Virtual Visits (Telemedicine).  Patients are able to view lab/test results, encounter notes, upcoming appointments, etc.  Non-urgent messages can be sent to your provider as well.   To learn more about what you can do with MyChart, go to [email or web address].    Your next appointment:   3 month(s)  The format for your next appointment:   In Person  Provider:   Mertie Moores, MD {  Important Information About Sugar         Signed, Emmaline Life, NP  06/02/2022 12:52 PM    [place]

## 2022-06-02 ENCOUNTER — Encounter: Payer: Self-pay | Admitting: Nurse Practitioner

## 2022-06-02 ENCOUNTER — Telehealth: Payer: Self-pay | Admitting: Cardiovascular Disease

## 2022-06-02 DIAGNOSIS — I130 Hypertensive heart and chronic kidney disease with heart failure and stage 1 through stage 4 chronic kidney disease, or unspecified chronic kidney disease: Secondary | ICD-10-CM | POA: Diagnosis not present

## 2022-06-02 DIAGNOSIS — D630 Anemia in neoplastic disease: Secondary | ICD-10-CM | POA: Diagnosis not present

## 2022-06-02 DIAGNOSIS — N1831 Chronic kidney disease, stage 3a: Secondary | ICD-10-CM | POA: Diagnosis not present

## 2022-06-02 DIAGNOSIS — I5033 Acute on chronic diastolic (congestive) heart failure: Secondary | ICD-10-CM | POA: Diagnosis not present

## 2022-06-02 DIAGNOSIS — C931 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: Secondary | ICD-10-CM | POA: Diagnosis not present

## 2022-06-02 DIAGNOSIS — I5022 Chronic systolic (congestive) heart failure: Secondary | ICD-10-CM | POA: Diagnosis not present

## 2022-06-02 MED ORDER — POTASSIUM CHLORIDE CRYS ER 20 MEQ PO TBCR: 20.0000 meq | EXTENDED_RELEASE_TABLET | Freq: Every day | ORAL | 3 refills | Status: DC

## 2022-06-02 NOTE — Telephone Encounter (Signed)
RN calling to say patient had a weight gain of 3.2lbs over night. Bp is low 90/60. Please advise

## 2022-06-02 NOTE — Telephone Encounter (Deleted)
Sar lath

## 2022-06-02 NOTE — Telephone Encounter (Signed)
Returned call to patient and spoke with husband. Advised him that yes, it would be okay for her to take the extra dose now since her BP is 98/58 and she is asymptomatic, but advised that since its already after 4pm, she will likely be up most of the night running to the bathroom. Pt can wait until the morning to take the additional dose if she chooses, but should again check her BP to ensure it's " about 100" as Dr Acie Fredrickson stated. Reiterated that she should weigh herself in the morning as well. Declares understanding.

## 2022-06-02 NOTE — Telephone Encounter (Signed)
Husband called back to say that patient bp is 98/58. Pharmacy told him to call office to see if it would be okay to give patient another half a pill. Please advise

## 2022-06-02 NOTE — Telephone Encounter (Signed)
Pts husband says they went ot the Pharmacy and her BP is 98/55 and HR 80.. he says she is feeling well and he is unsure what to do since her BP is so close to 100.... he is asking to talk with Judson Roch again.. I reiterated he last note but she says he feels more comfortable talking to her since she knows what is going on with them.   I will forward the message to her.

## 2022-06-02 NOTE — Telephone Encounter (Signed)
[name], Sherry Cheng, MD  Shellia Cleverly, RN 5 hours ago (10:03 AM)    BP is too low to take additional lasix this am  Hold diltiazem if she has not take it already  If her SBP gets about 100, she should take an extra lasix this afternoon  If she continues to decline, she will have to return to the ER    Called and spoke with patient (and spouse on speakerphone) who states that she has already taken her Diltiazem today. She states that she doesn't have a BP kit at home, they have ordered one, but that she can go to the drug store and get her BP checked. Both patient and spouse state that they understand to get her pressure checked and if top number is 100 or greater, she needs to take an extra furosemide tablet for fluid retention. Pt states she will likely wait until tomorrow and check her weight again, then her BP and take the lasix if needed. Advised her that if her weight has gone up anymore over what it is currently and her SBP is still below 174mhg (prohibiting extra lasix), she should go to ED for IV diuresis. Pt understands that she needs to pick up and start taking potassium RX daily that I've sent to pharmacy on file.

## 2022-06-02 NOTE — Telephone Encounter (Signed)
Please also send potassium chloride 20 mEq for her to take one daily. I did not realize during the office visit that she was not on oral potassium.

## 2022-06-05 ENCOUNTER — Other Ambulatory Visit: Payer: Self-pay

## 2022-06-05 ENCOUNTER — Inpatient Hospital Stay: Payer: Medicare Other

## 2022-06-05 DIAGNOSIS — C931 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: Secondary | ICD-10-CM | POA: Diagnosis not present

## 2022-06-05 DIAGNOSIS — I480 Paroxysmal atrial fibrillation: Secondary | ICD-10-CM

## 2022-06-05 DIAGNOSIS — Z79899 Other long term (current) drug therapy: Secondary | ICD-10-CM | POA: Diagnosis not present

## 2022-06-05 DIAGNOSIS — Z5111 Encounter for antineoplastic chemotherapy: Secondary | ICD-10-CM | POA: Diagnosis not present

## 2022-06-05 LAB — CBC WITH DIFFERENTIAL/PLATELET
Abs Immature Granulocytes: 0.04 10*3/uL (ref 0.00–0.07)
Basophils Absolute: 0.1 10*3/uL (ref 0.0–0.1)
Basophils Relative: 2 %
Eosinophils Absolute: 0.1 10*3/uL (ref 0.0–0.5)
Eosinophils Relative: 3 %
HCT: 26.2 % — ABNORMAL LOW (ref 36.0–46.0)
Hemoglobin: 8.2 g/dL — ABNORMAL LOW (ref 12.0–15.0)
Immature Granulocytes: 1 %
Lymphocytes Relative: 27 %
Lymphs Abs: 1.4 10*3/uL (ref 0.7–4.0)
MCH: 33.6 pg (ref 26.0–34.0)
MCHC: 31.3 g/dL (ref 30.0–36.0)
MCV: 107.4 fL — ABNORMAL HIGH (ref 80.0–100.0)
Monocytes Absolute: 0.9 10*3/uL (ref 0.1–1.0)
Monocytes Relative: 17 %
Neutro Abs: 2.7 10*3/uL (ref 1.7–7.7)
Neutrophils Relative %: 50 %
Platelets: 44 10*3/uL — ABNORMAL LOW (ref 150–400)
RBC: 2.44 MIL/uL — ABNORMAL LOW (ref 3.87–5.11)
RDW: 22.4 % — ABNORMAL HIGH (ref 11.5–15.5)
WBC: 5.3 10*3/uL (ref 4.0–10.5)
nRBC: 0.4 % — ABNORMAL HIGH (ref 0.0–0.2)

## 2022-06-05 LAB — CMP (CANCER CENTER ONLY)
ALT: 15 U/L (ref 0–44)
AST: 17 U/L (ref 15–41)
Albumin: 3.6 g/dL (ref 3.5–5.0)
Alkaline Phosphatase: 52 U/L (ref 38–126)
Anion gap: 9 (ref 5–15)
BUN: 16 mg/dL (ref 8–23)
CO2: 26 mmol/L (ref 22–32)
Calcium: 8.6 mg/dL — ABNORMAL LOW (ref 8.9–10.3)
Chloride: 100 mmol/L (ref 98–111)
Creatinine: 0.98 mg/dL (ref 0.44–1.00)
GFR, Estimated: >60 mL/min (ref >60)
Glucose, Bld: 107 mg/dL — ABNORMAL HIGH (ref 70–99)
Potassium: 4.4 mmol/L (ref 3.5–5.1)
Sodium: 135 mmol/L (ref 135–145)
Total Bilirubin: 0.8 mg/dL (ref 0.3–1.2)
Total Protein: 5.5 g/dL — ABNORMAL LOW (ref 6.5–8.1)

## 2022-06-05 MED ORDER — METOPROLOL SUCCINATE ER 100 MG PO TB24: 150.0000 mg | ORAL_TABLET | Freq: Two times a day (BID) | ORAL | 1 refills | Status: DC

## 2022-06-05 MED ORDER — PREDNISONE 20 MG PO TABS: 20.0000 mg | ORAL_TABLET | Freq: Every day | ORAL | 0 refills | Status: DC

## 2022-06-05 NOTE — Progress Notes (Unsigned)
Re-Rash  Rash: Patient complains of rash involving the face, neck, and Right and left forearm . Rash started 3 hours ago. Appearance of rash at onset: Color of lesion(s): pink, Texture of lesion(s): raised. Rash has changed over time Initial distribution: face.  Discomfort associated with rash: is pruritic.  Associated symptoms: none. Denies: abdominal pain, congestion, cough, fever, myalgia, nausea, and vomiting. Patient has not had previous evaluation of rash. Patient has not had previous treatment.  Patient has not had contacts with similar rash. Patient has not identified precipitant. Patient has not had new exposures (soaps, lotions, laundry detergents, foods, medications, plants, insects or animals.).  Discussed in detail new medications.  Was recently started on Lasix and potassium supplements.  Admits to eating fresh fruit this morning although she denies any new types of fruits.  No history of allergic reaction to foods or medications.  Has not tried anything over-the-counter given onset was in route to the cancer center.  Plan- Recommend over-the-counter antihistamine such as Claritin/Allegra daily.  Benadryl 25 mg at bedtime. Rx prednisone 20 mg daily x5. She already has follow-up with Silvio Clayman, NP on Monday. Please let us know if symptoms worsen or fail to improve.  Faythe Casa, NP 06/05/2022 11:46 AM

## 2022-06-06 DIAGNOSIS — N1831 Chronic kidney disease, stage 3a: Secondary | ICD-10-CM | POA: Diagnosis not present

## 2022-06-06 DIAGNOSIS — I130 Hypertensive heart and chronic kidney disease with heart failure and stage 1 through stage 4 chronic kidney disease, or unspecified chronic kidney disease: Secondary | ICD-10-CM | POA: Diagnosis not present

## 2022-06-06 DIAGNOSIS — I5022 Chronic systolic (congestive) heart failure: Secondary | ICD-10-CM | POA: Diagnosis not present

## 2022-06-06 DIAGNOSIS — D630 Anemia in neoplastic disease: Secondary | ICD-10-CM | POA: Diagnosis not present

## 2022-06-06 DIAGNOSIS — I5033 Acute on chronic diastolic (congestive) heart failure: Secondary | ICD-10-CM | POA: Diagnosis not present

## 2022-06-06 DIAGNOSIS — C931 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: Secondary | ICD-10-CM | POA: Diagnosis not present

## 2022-06-07 DIAGNOSIS — I5022 Chronic systolic (congestive) heart failure: Secondary | ICD-10-CM | POA: Diagnosis not present

## 2022-06-07 DIAGNOSIS — I5033 Acute on chronic diastolic (congestive) heart failure: Secondary | ICD-10-CM | POA: Diagnosis not present

## 2022-06-07 DIAGNOSIS — N1831 Chronic kidney disease, stage 3a: Secondary | ICD-10-CM | POA: Diagnosis not present

## 2022-06-07 DIAGNOSIS — D630 Anemia in neoplastic disease: Secondary | ICD-10-CM | POA: Diagnosis not present

## 2022-06-07 DIAGNOSIS — I130 Hypertensive heart and chronic kidney disease with heart failure and stage 1 through stage 4 chronic kidney disease, or unspecified chronic kidney disease: Secondary | ICD-10-CM | POA: Diagnosis not present

## 2022-06-07 DIAGNOSIS — C931 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: Secondary | ICD-10-CM | POA: Diagnosis not present

## 2022-06-09 MED FILL — Dexamethasone Sodium Phosphate Inj 100 MG/10ML: INTRAMUSCULAR | Qty: 1 | Status: AC

## 2022-06-10 ENCOUNTER — Other Ambulatory Visit: Payer: Self-pay | Admitting: Primary Care

## 2022-06-11 NOTE — Progress Notes (Unsigned)
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  Please see concurrent tissue biopsy (below) for additional work-up and final diagnosis.    FINAL MICROSCOPIC DIAGNOSIS:   A. SOFT TISSUE MASS, PRE SACRAL, NEEDLE CORE BIOPSY:  -  Chronic lymphocytic leukemia/small lymphocytic lymphoma  -  Extra medullary hematopoiesis  -  See comment   COMMENT:  The biopsy consists of multiple soft tissue cores with lymphoid nodules and a dense hematopoietic infiltrate consistent with extra medullary hematopoiesis.  MPO and E-cadherin highlight myeloid and erythroid precursors respectively.  CD34 highlights increased vasculature and is also positive within the cytoplasm of megakaryocytes.  A few small, immature mononuclear cells appear to be positive for CD34 and CD117. TdT shows rare, scattered positive cells.  CD20 highlights aggregates of B cells which are admixed with CD3 positive T cells.  T cells are an admixture of CD4 and CD8.  The B cells are also positive for CD5, CD23 and Bcl-2.  The B cells  do not show significant staining for CD10, BCL6 or cyclin D1.  CD138 highlights scattered plasma cells which are polytypic by kappa and lambda in situ hybridization.  Flow cytometry performed on the sample (see WL [id number]) identified a kappa restricted CD5 positive B-cell population comprising 70% of lymphocytes.  In addition, a small myeloblast population comprised 2% of the total cellular events.   Overall, the findings are consistent with soft tissue involvement by  chronic lymphocytic leukemia/small lymphocytic lymphoma and extra medullary hematopoiesis. In reviewing the patient's CBC data (macrocytic anemia and thrombocytopenia), I would recommend a bone marrow biopsy to assess for marrow involvement by CLL/SLL.    12/23/2021 Imaging   EXAM: CT CHEST, ABDOMEN, AND PELVIS WITH CONTRAST  IMPRESSION: 1. Slight interval enlargement of a presacral soft tissue mass measuring 7.2 x 4.7 cm, previously 6.9 x 4.1 cm on prior MR of the pelvis dated 07/04/2021. By report, this represents a biopsy proven lymphoma. 2. Pleural nodule of the dependent right lower lobe overlying the posterior right tenth rib and pleural or paraspinous soft tissue mass overlying the right aspect of the T10 vertebral body, very slightly enlarged compared to prior examination of the chest dated 06/25/2020, consistent with additional sites of lymphomatous involvement given very indolent growth. These could be better assessed for metabolic activity by FDG PET/CT if desired. 3. There is mild, bibasilar predominant pulmonary fibrosis in a pattern featuring irregular peripheral interstitial opacity, septal thickening, but without clear evidence of subpleural bronchiolectasis or honeycombing, with a somewhat asymmetric distribution most conspicuously involving the right lower lobe and lingula. These findings are significantly worsened when compared to prior examination dated 06/25/2020, particularly in the  right lower lobe.  Given interval change, this may reflect sequelae of interval infection or aspiration, however appearance is generally suspicious for fibrotic interstitial lung disease, and if characterized by ATS pulmonary fibrosis is in an "indeterminate for UIP" pattern, differential considerations including both UIP and NSIP. 4. Emphysema.   Aortic Atherosclerosis (ICD10-I70.0) and Emphysema (ICD10-J43.9).   01/05/2022 Pathology Results   DIAGNOSIS:   BONE MARROW, ASPIRATE, CLOT, CORE:  -Hypercellular bone marrow for age with features of  myelodysplastic/myeloproliferative neoplasm  -Minor abnormal B-cell population  -See comment   PERIPHERAL BLOOD:  -Macrocytic anemia  -Neutrophilic left shift and monocytosis  -Thrombocytopenia   COMMENT:  The bone marrow is hypercellular for age with dyspoietic changes  involving myeloid cell lines associated with monocytosis and increased number of blastic cells (12%) as primarily seen by morphology, many of which display monocytic features.  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  In this background, there are several predominantly small lymphoid aggregates mostly composed of small lymphoid cells.  By flow cytometry, a minor abnormal B-cell population expressing CD5 is seen and representing 2% of all cells.  This correlate with previously known B-cell lymphoproliferative process.  Correlation with cytogenetic and FISH studies is strongly recommended.    DIAGNOSIS:   -Increased number of monocytic cells present (25%)  -Minor abnormal B-cell population identified.  -See comment   COMMENT:  Flow cytometric analysis shows increased number of monocytic cells representing 25% of all cells but without aberrant phenotype or CD34 expression.  A significant CD34-positive blastic population is  not identified.  The lymphoid population shows a minor B-cell population representing 2% of all cells and expressing B-cell antigens including CD20 associated with CD5, CD200 and possibly dim kappa expression.  The latter findings are abnormal and correlate with previously known B-cell lymphoproliferative process.  No significant T-cell phenotypic abnormalities identified.    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 Last seen by me 05/10/22. She subsequently began cycle 3 vidaza 05/15/22. She completed 4 doses prior to being re-admitted for symptomatic anemia and HF exacerbation. She was transfused 1 unit and given IV lasix with improvement of her symptoms. She was discharged 5/28 and was seen by my colleague Sherry Casa, [name] on 05/31/22 and [name] 06/01/22. She is here today prior to cycle 4 vidaza.   She presents today in a wheelchair, accompanied by her spouse.  She feels better overall except today has low energy.  She has home visits from PT once a week but does not feel she is getting stronger.  She feels generally weak, can ambulate inside her home with a walker or cane.  Denies recent fall.  Arthritis in knees is worse today due to chance of rain.  She usually has ankle edema that resolves with leg elevation but in the last day she has leg swelling up  to her knees.  She takes daily Lasix and potassium.  She does not wear compression stockings because they  are too tight.  She is gaining weight but within Sherry parameters.  Denies new cough or shortness of breath.  Abdominal cramping is stable, bowels moving normally.  Denies recent fever, chills, bleeding.    All other systems were reviewed with the patient and are negative.  MEDICAL HISTORY:  Past Medical History:  Diagnosis Date   Arthritis    Rheumatoid arthritis   Celiac disease    Chronic kidney disease    stage 3 from MD notes   COPD (chronic obstructive pulmonary disease) ([place])    Dyspnea    with going up stairs   Family history of adverse reaction to anesthesia    father had hard time waking up   Headache    sinus headaches   Hot flashes    Hypertension    Iron deficiency anemia    Pneumonia    per patient "I have walking pneumonia"    SURGICAL HISTORY: Past Surgical History:  Procedure Laterality Date   COLONOSCOPY     ECTOPIC PREGNANCY SURGERY      x 2   IR IMAGING GUIDED PORT INSERTION  01/31/2022   REVERSE SHOULDER ARTHROPLASTY Right 02/02/2017   Procedure: RIGHT REVERSE SHOULDER ARTHROPLASTY;  Surgeon: Netta Cedars, MD;  Location: [facility];  Service: Orthopedics;  Laterality: Right;    I have reviewed the social history and family history with the patient and they are unchanged from previous note.  ALLERGIES:  is allergic to digoxin and related, gluten meal, penicillins, alendronate, and hydroxychloroquine.  MEDICATIONS:  Current Outpatient Medications  Medication Sig Dispense Refill   acetaminophen (TYLENOL) 500 MG tablet Take 1,000 mg by mouth in the morning.     denosumab (PROLIA) 60 MG/ML SOSY injection Inject 60 mg into the skin every 6 (six) months.     diltiazem (CARDIZEM CD) 360 MG 24 hr capsule Take 1 capsule (360 mg total) by mouth daily. 90 capsule 3   furosemide (LASIX) 20 MG tablet Take 1 tablet (20 mg total) by mouth daily. 30 tablet 11   gabapentin (NEURONTIN) 300 MG capsule Take 300 mg by mouth in the morning.     InFLIXimab (REMICADE IV)  Inject 10 mg into the vein every 2 (two) months.     metoprolol succinate (TOPROL-XL) 100 MG 24 hr tablet Take 1.5 tablets (150 mg total) by mouth 2 (two) times daily. 270 tablet 1   potassium chloride SA (KLOR-CON M20) 20 MEQ tablet Take 1 tablet (20 mEq total) by mouth daily. Take with furosemide 90 tablet 3   predniSONE (DELTASONE) 5 MG tablet Take 5 mg by mouth daily with breakfast.     Tiotropium Bromide-Olodaterol (STIOLTO RESPIMAT) 2.5-2.5 MCG/ACT AERS Inhale 2 puffs into the lungs daily.     No current facility-administered medications for this visit.   Facility-Administered Medications Ordered in Other Visits  Medication Dose Route Frequency Provider Last Rate Last Admin   0.9 %  sodium chloride infusion   Intravenous Once Truitt Merle, MD       azaCITIDine [facility]) 120 mg in sodium chloride 0.9 % 50 mL chemo infusion  75 mg/m2 (Treatment Plan Recorded) Intravenous Once Truitt Merle, MD       dexamethasone (DECADRON) 10 mg in sodium chloride 0.9 % 50 mL IVPB  10 mg Intravenous Once Truitt Merle, MD       palonosetron (ALOXI) injection 0.25 mg  0.25  mg Intravenous Once Truitt Merle, MD       sodium chloride flush (NS) 0.9 % injection 10 mL  10 mL Intracatheter PRN Truitt Merle, MD   10 mL at 03/14/22 1709    PHYSICAL EXAMINATION: ECOG PERFORMANCE STATUS: 1-2  Vitals:   06/12/22 0958  BP: (!) 94/59  Pulse: 90  Resp: 15  Temp: 97.7 F (36.5 C)  SpO2: 98%   Filed Weights   06/12/22 0958  Weight: 121 lb 4.8 oz (55 kg)    GENERAL:alert, no distress and comfortable SKIN: Dry with scattered ecchymoses EYES:  sclera clear LUNGS: clear with normal breathing effort HEART: A-fib, regular rate.  4+ pitting bilateral lower extremity edema  ABDOMEN:abdomen soft, non-tender and normal bowel sounds NEURO: alert & oriented x 3 with fluent speech   LABORATORY DATA:  I have reviewed the data as listed    Latest Ref Rng & Units 06/12/2022    9:32 AM 06/05/2022   11:05 AM 05/31/2022   10:36 AM  CBC   WBC 4.0 - 10.5 K/uL 7.1  5.3  4.5   Hemoglobin 12.0 - 15.0 g/dL 8.7  8.2  8.6   Hematocrit 36.0 - 46.0 % 26.7  26.2  26.3   Platelets 150 - 400 K/uL 98  44  52         Latest Ref Rng & Units 06/12/2022    9:32 AM 06/05/2022   11:05 AM 05/31/2022   10:36 AM  CMP  Glucose 70 - 99 mg/dL 102  107  121   BUN 8 - 23 mg/dL 16  16  18    Creatinine 0.44 - 1.00 mg/dL 0.95  0.98  0.92   Sodium 135 - 145 mmol/L 134  135  137   Potassium 3.5 - 5.1 mmol/L 4.0  4.4  3.5   Chloride 98 - 111 mmol/L 99  100  98   CO2 22 - 32 mmol/L 26  26  30    Calcium 8.9 - 10.3 mg/dL 9.1  8.6  9.0   Total Protein 6.5 - 8.1 g/dL 5.5  5.5  5.6   Total Bilirubin 0.3 - 1.2 mg/dL 0.8  0.8  0.7   Alkaline Phos 38 - 126 U/L 54  52  58   AST 15 - 41 U/L 18  17  15    ALT 0 - 44 U/L 17  15  16        RADIOGRAPHIC STUDIES: I have personally reviewed the radiological images as listed and agreed with the findings in the report. No results found.   ASSESSMENT & PLAN: [name] is a 76 y.o. female with    1. 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 SPEP and UPEP with immunofixation were negative. No lab evidence of hemolysis, erythropoietin level is normal.  -Her prior bone marrow biopsy in 2016 was negative except several small lymphoid aggregation, suspicious for low-grade B cell lymphoproliferative process, especially SLL. Her peripheral white count has been normal, no elevated lymphocytes -CT scan from 03/2015 was negative for adenopathy or splenomegaly. no B symptoms  -she has been under observation -She developed worsening back pain in Summer 2022 and underwent work up, pelvic MRI 07/04/21 showed a mass in the presacral space. -bone marrow biopsy on 01/05/22 showed hypercellular marrow with features of myelodysplastic/myeloproliferative neoplasm, increased 12% blasts, most consistent with CMML-2. Her BM biopsy  was reviewed  at [facility] and was felt to be CMML-1 with 5% blasts. Her previous presacral soft tissue biopsy which showed same CMML as her bone marrow biopsy. -she met Dr. Linus Orn at [facility] on 01/26/22. Vidaza was also recommended  -She began azacitadine daily for 7 days (5+2) every 28 days, starting 02/06/22. Tolerated mostly well -She was hospitalized 03/29/2022 - 04/13/2022 and went to 2 weeks of rehab.  Oncology work-up during hospitalization showed stable chronic presacral soft tissue tumor, no other new imaging findings.  Repeat bone marrow biopsy showed persistent CMML with only 2% blasts, slightly lower than before.  There is no other evidence of disease progression in the marrow -She resumed dose-reduced Vidaza to 5 days (q28 days) starting 5/22 -she completed 4 days and was readmitted 5/26 - 5/28 for CHF exacerbation. She was transfused 1 unit and diuresed. She improved and has been doing PT at home. She does not feel much stronger -[name] appears stable.  She has recurrent leg edema, no cough or dyspnea.  Hesitant to increase Lasix given soft BP.  She continues PT at home for generalized deconditioning.  CBC has improved to baseline -Patient seen with Dr. Burr Medico who recommends to proceed with cycle 4 Vidaza daily x5 starting today and restage with MRI after this cycle.  -Labs every 2 weeks -Follow-up in 4 weeks to review MRI and next cycle   2.  Hospitalization 04/01/2022 - 04/13/2022 for dyspnea, deconditioning, and A-fib with RVR; recurrent hospitalization 5/26-28 for CHF exacerbation -  She was found to have pleural effusion s/p thoracentesis 03/30/2022, cytology was negative for malignant cells.  Dyspnea resolved after hospitalization.  In the past 4-5 days she has mild exertional dyspnea, okay to monitor for now.  We will repeat chest x-ray and possible Thora if she has recurrent pleural effusion - Bone marrow biopsy 04/04/2022 showed persistent CMML, decreased blasts 2%.  No disease progression in the  marrow - Lumbar MRI showed stable presacral soft tissue mass.  - Heart rate controlled with high dose metoprolol, digoxin and diltiazem -She was discharged to rehab, PS improved.  Vidaza was dose reduced to 5 days with cycle 3 -She was readmitted after cycle 3-day 4 from 5/26-28 for CHF exacerbation.  Transfuse 1 unit and diuresed, symptoms improved -Home PT for now -Continue close follow-up   3. Symptom management: abdominal cramps, insomnia, pancytopenia, dyspnea, weakness, low appetite  -She developed abdominal cramps during the first week of vidaza, debilitating at first when not having BMs. -She corrected constipation, cramps have improved but not resolved.  On Bentyl as needed -I recommended mirtazapine for low appetite, she previously declined. I encouraged supplements  -She has worsening cytopenias after starting treatment, she receives irradiated blood products as needed  -Due to pancytopenia and deconditioning cycle 3 Vidaza was postponed but she was subsequently hospitalized 03/29/22 - 04/13/22 -Vidaza was reduced from 7 days monthly to 5 days monthly with cycle 3, she completed 4 days before readmission for CHF exacerbation 05/19/2022 - 05/21/2022   4. COPD, AF, HTN -on resp meds and eliquis; continue per PCP, Sherry, and pulmonology  - she recently changed inhalers from stiolto to breztri, her HR and dyspnea issues worsened since then.  Switched back to [employer] in 03/2022 -Due to thrombocytopenia, Eliquis has been on hold -Continue PCP, Sherry, and pulmonology follow-up   5. Rheumatoid arthritis -on Remicade q8 weeks per rheumatologist Dr. Trudie Reed. We reviewed the risk of lymphoma and immunosupression on Remicade -I previously cc'd my note to Dr. Trudie Reed to  consider an alternative given the new diagnosis  -she continues remidcade -She takes vitamin supplements and gets prolia injection q63month with her PCP.     PLAN: -hospital course and recent labs reviewed -proceed with  cycle 4 daily vidaza x5 starting today -lab q2 weeks -restaging MRI after this cycle, in 3 weeks -Pt seen with Dr. FBurr Medico-message to Sherry for med adjustment/management of fluid status   Orders Placed This Encounter  Procedures   MR Lumbar Spine W Wo Contrast    Standing Status:   Future    Standing Expiration Date:   06/13/2023    Order Specific Question:   If indicated for the ordered procedure, I authorize the administration of contrast media per Radiology protocol    Answer:   Yes    Order Specific Question:   What is the patient's sedation requirement?    Answer:   No Sedation    Order Specific Question:   Does the patient have a pacemaker or implanted devices?    Answer:   No    Order Specific Question:   Use SRS Protocol?    Answer:   No    Order Specific Question:   [facility]?    [facility] - [facility] lbs[facility]   All questions were answered. The patient knows to call the clinic with any problems, questions or concerns. No barriers to learning were detected.     [name] 06/12/22    Addendum  I have seen the patient, examined her. I agree with the assessment and and plan and have edited the notes.   [name] clinically stable, still fatigued, with limited performance status.  She looks better than she was in the hospital when I saw her last time.  She was recently hospitalized again for COPD exacerbation, she has recovered well.  Complains of bilateral lower extremity edema is getting worse, I encouraged her to follow-up with Sherry.  Lab reviewed, plan to continue 5 days a injection, I have reduced the dose to 5 days infusion every 28 days. Plan to repeat MRI to evaluate the presacral soft tissue mass after this cycle of chemo.. all questions were answered.   [name] 06/12/2022

## 2022-06-12 ENCOUNTER — Other Ambulatory Visit: Payer: Self-pay

## 2022-06-12 ENCOUNTER — Encounter: Payer: Self-pay | Admitting: Pulmonary Disease

## 2022-06-12 ENCOUNTER — Inpatient Hospital Stay: Payer: Medicare Other

## 2022-06-12 ENCOUNTER — Encounter: Payer: Self-pay | Admitting: Nurse Practitioner

## 2022-06-12 ENCOUNTER — Inpatient Hospital Stay (HOSPITAL_BASED_OUTPATIENT_CLINIC_OR_DEPARTMENT_OTHER): Payer: Medicare Other | Admitting: Nurse Practitioner

## 2022-06-12 ENCOUNTER — Telehealth: Payer: Self-pay

## 2022-06-12 ENCOUNTER — Encounter: Payer: Self-pay | Admitting: Cardiovascular Disease

## 2022-06-12 VITALS — BP 94/59 | HR 90 | Temp 97.7°F | Resp 15 | Ht 63.0 in | Wt 121.3 lb

## 2022-06-12 DIAGNOSIS — C931 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: Secondary | ICD-10-CM

## 2022-06-12 DIAGNOSIS — Z5111 Encounter for antineoplastic chemotherapy: Secondary | ICD-10-CM | POA: Diagnosis not present

## 2022-06-12 DIAGNOSIS — Z79899 Other long term (current) drug therapy: Secondary | ICD-10-CM | POA: Diagnosis not present

## 2022-06-12 LAB — CBC WITH DIFFERENTIAL/PLATELET
Abs Immature Granulocytes: 0.06 10*3/uL (ref 0.00–0.07)
Basophils Absolute: 0.1 10*3/uL (ref 0.0–0.1)
Basophils Relative: 1 %
Eosinophils Absolute: 0.1 10*3/uL (ref 0.0–0.5)
Eosinophils Relative: 2 %
HCT: 26.7 % — ABNORMAL LOW (ref 36.0–46.0)
Hemoglobin: 8.7 g/dL — ABNORMAL LOW (ref 12.0–15.0)
Immature Granulocytes: 1 %
Lymphocytes Relative: 27 %
Lymphs Abs: 1.9 10*3/uL (ref 0.7–4.0)
MCH: 35.4 pg — ABNORMAL HIGH (ref 26.0–34.0)
MCHC: 32.6 g/dL (ref 30.0–36.0)
MCV: 108.5 fL — ABNORMAL HIGH (ref 80.0–100.0)
Monocytes Absolute: 1.8 10*3/uL — ABNORMAL HIGH (ref 0.1–1.0)
Monocytes Relative: 25 %
Neutro Abs: 3.2 10*3/uL (ref 1.7–7.7)
Neutrophils Relative %: 44 %
Platelets: 98 10*3/uL — ABNORMAL LOW (ref 150–400)
RBC: 2.46 MIL/uL — ABNORMAL LOW (ref 3.87–5.11)
RDW: 21.5 % — ABNORMAL HIGH (ref 11.5–15.5)
WBC: 7.1 10*3/uL (ref 4.0–10.5)
nRBC: 0.8 % — ABNORMAL HIGH (ref 0.0–0.2)

## 2022-06-12 LAB — CMP (CANCER CENTER ONLY)
ALT: 17 U/L (ref 0–44)
AST: 18 U/L (ref 15–41)
Albumin: 3.5 g/dL (ref 3.5–5.0)
Alkaline Phosphatase: 54 U/L (ref 38–126)
Anion gap: 9 (ref 5–15)
BUN: 16 mg/dL (ref 8–23)
CO2: 26 mmol/L (ref 22–32)
Calcium: 9.1 mg/dL (ref 8.9–10.3)
Chloride: 99 mmol/L (ref 98–111)
Creatinine: 0.95 mg/dL (ref 0.44–1.00)
GFR, Estimated: >60 mL/min (ref >60)
Glucose, Bld: 102 mg/dL — ABNORMAL HIGH (ref 70–99)
Potassium: 4 mmol/L (ref 3.5–5.1)
Sodium: 134 mmol/L — ABNORMAL LOW (ref 135–145)
Total Bilirubin: 0.8 mg/dL (ref 0.3–1.2)
Total Protein: 5.5 g/dL — ABNORMAL LOW (ref 6.5–8.1)

## 2022-06-12 MED ORDER — PALONOSETRON HCL INJECTION 0.25 MG/5ML
0.2500 mg | Freq: Once | INTRAVENOUS | Status: AC
  Administered 2022-06-12: 0.25 mg via INTRAVENOUS
  Filled 2022-06-12: qty 5

## 2022-06-12 MED ORDER — SODIUM CHLORIDE 0.9 % IV SOLN
10.0000 mg | Freq: Once | INTRAVENOUS | Status: AC
  Administered 2022-06-12: 10 mg via INTRAVENOUS
  Filled 2022-06-12: qty 10

## 2022-06-12 MED ORDER — SODIUM CHLORIDE 0.9 % IV SOLN: Freq: Once | INTRAVENOUS | Status: AC

## 2022-06-12 MED ORDER — SODIUM CHLORIDE 0.9 % IV SOLN
75.0000 mg/m2 | Freq: Once | INTRAVENOUS | Status: AC
  Administered 2022-06-12: 120 mg via INTRAVENOUS
  Filled 2022-06-12: qty 12

## 2022-06-12 MED ORDER — STIOLTO RESPIMAT 2.5-2.5 MCG/ACT IN AERS: 2.0000 | INHALATION_SPRAY | Freq: Every day | RESPIRATORY_TRACT | 5 refills | Status: DC

## 2022-06-12 MED FILL — Dexamethasone Sodium Phosphate Inj 100 MG/10ML: INTRAMUSCULAR | Qty: 1 | Status: AC

## 2022-06-12 NOTE — Patient Instructions (Signed)
[facility] ONCOLOGY  Discharge Instructions: Thank you for choosing [facility] to provide your oncology and hematology care.   If you have a lab appointment with the [facility], please go directly to the [facility] and check in at the registration area.   Wear comfortable clothing and clothing appropriate for easy access to any Portacath or PICC line.   We strive to give you quality time with your provider. You may need to reschedule your appointment if you arrive late (15 or more minutes).  Arriving late affects you and other patients whose appointments are after yours.  Also, if you miss three or more appointments without notifying the office, you may be dismissed from the clinic at the provider's discretion.      For prescription refill requests, have your pharmacy contact our office and allow 72 hours for refills to be completed.    Today you received the following chemotherapy and/or immunotherapy agents: Azacitidine.       To help prevent nausea and vomiting after your treatment, we encourage you to take your nausea medication as directed.  BELOW ARE SYMPTOMS THAT SHOULD BE REPORTED IMMEDIATELY: *FEVER GREATER THAN 100.4 F (38 C) OR HIGHER *CHILLS OR SWEATING *NAUSEA AND VOMITING THAT IS NOT CONTROLLED WITH YOUR NAUSEA MEDICATION *UNUSUAL SHORTNESS OF BREATH *UNUSUAL BRUISING OR BLEEDING *URINARY PROBLEMS (pain or burning when urinating, or frequent urination) *BOWEL PROBLEMS (unusual diarrhea, constipation, pain near the anus) TENDERNESS IN MOUTH AND THROAT WITH OR WITHOUT PRESENCE OF ULCERS (sore throat, sores in mouth, or a toothache) UNUSUAL RASH, SWELLING OR PAIN  UNUSUAL VAGINAL DISCHARGE OR ITCHING   Items with * indicate a potential emergency and should be followed up as soon as possible or go to the Emergency Department if any problems should occur.  Please show the CHEMOTHERAPY ALERT CARD or IMMUNOTHERAPY ALERT CARD at check-in  to the Emergency Department and triage nurse.  Should you have questions after your visit or need to cancel or reschedule your appointment, please contact [facility]  Dept: [phone]  and follow the prompts.  Office hours are 8:00 a.m. to 4:30 p.m. Monday - Friday. Please note that voicemails left after 4:00 p.m. may not be returned until the following business day.  We are closed weekends and major holidays. You have access to a nurse at all times for urgent questions. Please call the main number to the clinic Dept: [phone] and follow the prompts.   For any non-urgent questions, you may also contact your provider using MyChart. We now offer e-Visits for anyone 64 and older to request care online for non-urgent symptoms. For details visit [email or web address].   Also download the MyChart app! Go to the app store, search "MyChart", open the app, select [facility], and log in with your MyChart username and password.  Masks are optional in the cancer centers. If you would like for your care team to wear a mask while they are taking care of you, please let them know. For doctor visits, patients may have with them one support person who is at least 76 years old. At this time, visitors are not allowed in the infusion area.

## 2022-06-12 NOTE — Telephone Encounter (Signed)
(  See other note). Called and spoke to patient and husband. Recommendations have been made to increase Lasix & K for 2 days

## 2022-06-12 NOTE — Telephone Encounter (Signed)
-----   Message from Thayer Headings, MD sent at 06/12/2022  1:37 PM EDT ----- Myles Lipps have [name] increase her lasix to 20 PO BID for 2 days and Kdur to 20 meq BID for 2 days  She needs to elevated her legs above her heart for several hours a day . She needs compression hose ( put on before she gets up from her leg elevation positon )  Avoid all extra salt .   Cont to check bp   PN   ----- Message ----- From: Alla Feeling, NP Sent: 06/12/2022  11:15 AM EDT To: Thayer Headings, MD; Emmaline Life, NP  Hi Dr. Acie Fredrickson and Sharyn Lull,  I saw [name] today for CMML follow up and treatment. She has recurrent significant leg edema up to her knees, worse in the past day. Her husband messaged you today but it appears the pt didn't accurately convey the swelling to him. They are tracking her weight and she says she's within parameters. She was 112 at last cardiology f/up and 121 today. She would probably benefit from increasing lasix but BP is soft (94/59 in clinic) and I would appreciate your assistance.   Thanks, Regan Rakers, NP

## 2022-06-12 NOTE — Progress Notes (Signed)
Per Lacie NP, ok to treat with PLT 98 today.

## 2022-06-12 NOTE — Telephone Encounter (Signed)
Called and gave recommendation to both patient and husband via speakerphone to increase furosemide AND potassium to twice daily for 2 days only, then resume regular once daily dosing. Both agree to understanding the plan. No further questions.

## 2022-06-12 NOTE — Telephone Encounter (Signed)
Explained to patient to cut down on dietary sodium. Educated on compression socks and need to wear them, but she states she has tried "several different types and sizes and all of them hurt so bad!." Pt states she has bought a larger size than recommended, but still couldn't wear them "I don't know why they make them so tight." Advised her that if she truly won't wear the compression socks, she can attempt to wrap her lower legs with an ACE wrap, but that it needs to be tight enough to prevent fluid build-up. Explained that it should not be painful and she should not wrap tight enough that she feels tingling or color changes to her toes and still able to wiggle them, but that it could be a good substitute. Husband and pt declare understanding

## 2022-06-12 NOTE — Progress Notes (Signed)
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  Note recent hospitalizations.  Patient receiving vidaza.  Met with patient during infusion.  Says that she is eating and weight is coming back.  Monitoring weight for fluid gain.  Typically is eating about 2 meals per day.  Drinks ensure/boost daily.  Has been trying to monitor sodium content of food.      Medications: reviewed  Labs: reviewed  Anthropometrics:   Weight 121 lb  113 lb on 6/7 131 lb on 5/27 127 lb on 3/13   NUTRITION DIAGNOSIS: Inadequate oral intake improving   INTERVENTION:  Encouraged good sources of lean protein at meals/snacks Continue oral nutrition supplements Monitor weight for fluid gain    MONITORING, EVALUATION, GOAL: weight trends, intake   NEXT VISIT: as needed  Elliotte Marsalis B. Zenia Resides, [place], [place] Registered Dietitian [phone]

## 2022-06-13 ENCOUNTER — Inpatient Hospital Stay: Payer: Medicare Other

## 2022-06-13 ENCOUNTER — Encounter: Payer: Self-pay | Admitting: Hematology

## 2022-06-13 VITALS — BP 95/54 | HR 74 | Temp 97.8°F | Resp 18

## 2022-06-13 DIAGNOSIS — D630 Anemia in neoplastic disease: Secondary | ICD-10-CM | POA: Diagnosis not present

## 2022-06-13 DIAGNOSIS — I130 Hypertensive heart and chronic kidney disease with heart failure and stage 1 through stage 4 chronic kidney disease, or unspecified chronic kidney disease: Secondary | ICD-10-CM | POA: Diagnosis not present

## 2022-06-13 DIAGNOSIS — I5022 Chronic systolic (congestive) heart failure: Secondary | ICD-10-CM | POA: Diagnosis not present

## 2022-06-13 DIAGNOSIS — N1831 Chronic kidney disease, stage 3a: Secondary | ICD-10-CM | POA: Diagnosis not present

## 2022-06-13 DIAGNOSIS — C931 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: Secondary | ICD-10-CM | POA: Diagnosis not present

## 2022-06-13 DIAGNOSIS — I5033 Acute on chronic diastolic (congestive) heart failure: Secondary | ICD-10-CM | POA: Diagnosis not present

## 2022-06-13 DIAGNOSIS — Z79899 Other long term (current) drug therapy: Secondary | ICD-10-CM | POA: Diagnosis not present

## 2022-06-13 DIAGNOSIS — Z5111 Encounter for antineoplastic chemotherapy: Secondary | ICD-10-CM | POA: Diagnosis not present

## 2022-06-13 MED ORDER — SODIUM CHLORIDE 0.9 % IV SOLN
10.0000 mg | Freq: Once | INTRAVENOUS | Status: AC
  Administered 2022-06-13: 10 mg via INTRAVENOUS
  Filled 2022-06-13: qty 10

## 2022-06-13 MED ORDER — SODIUM CHLORIDE 0.9 % IV SOLN
75.0000 mg/m2 | Freq: Once | INTRAVENOUS | Status: AC
  Administered 2022-06-13: 120 mg via INTRAVENOUS
  Filled 2022-06-13: qty 12

## 2022-06-13 MED ORDER — SODIUM CHLORIDE 0.9 % IV SOLN: Freq: Once | INTRAVENOUS | Status: AC

## 2022-06-13 MED ORDER — HEPARIN SOD (PORK) LOCK FLUSH 100 UNIT/ML IV SOLN
500.0000 [IU] | Freq: Once | INTRAVENOUS | Status: AC | PRN
  Administered 2022-06-13: 500 [IU]

## 2022-06-13 MED ORDER — SODIUM CHLORIDE 0.9% FLUSH
10.0000 mL | INTRAVENOUS | Status: DC | PRN
  Administered 2022-06-13: 10 mL

## 2022-06-13 MED FILL — Dexamethasone Sodium Phosphate Inj 100 MG/10ML: INTRAMUSCULAR | Qty: 1 | Status: AC

## 2022-06-13 NOTE — Patient Instructions (Signed)
[facility]  Discharge Instructions: Thank you for choosing [facility] to provide your oncology and hematology care.   If you have a lab appointment with the [facility], please go directly to the [facility] and check in at the registration area.   Wear comfortable clothing and clothing appropriate for easy access to any Portacath or PICC line.   We strive to give you quality time with your provider. You may need to reschedule your appointment if you arrive late (15 or more minutes).  Arriving late affects you and other patients whose appointments are after yours.  Also, if you miss three or more appointments without notifying the office, you may be dismissed from the clinic at the provider's discretion.      For prescription refill requests, have your pharmacy contact our office and allow 72 hours for refills to be completed.    Today you received the following chemotherapy and/or immunotherapy agents: Azacitidine.       To help prevent nausea and vomiting after your treatment, we encourage you to take your nausea medication as directed.  BELOW ARE SYMPTOMS THAT SHOULD BE REPORTED IMMEDIATELY: *FEVER GREATER THAN 100.4 F (38 C) OR HIGHER *CHILLS OR SWEATING *NAUSEA AND VOMITING THAT IS NOT CONTROLLED WITH YOUR NAUSEA MEDICATION *UNUSUAL SHORTNESS OF BREATH *UNUSUAL BRUISING OR BLEEDING *URINARY PROBLEMS (pain or burning when urinating, or frequent urination) *BOWEL PROBLEMS (unusual diarrhea, constipation, pain near the anus) TENDERNESS IN MOUTH AND THROAT WITH OR WITHOUT PRESENCE OF ULCERS (sore throat, sores in mouth, or a toothache) UNUSUAL RASH, SWELLING OR PAIN  UNUSUAL VAGINAL DISCHARGE OR ITCHING   Items with * indicate a potential emergency and should be followed up as soon as possible or go to the Emergency Department if any problems should occur.  Please show the CHEMOTHERAPY ALERT CARD or IMMUNOTHERAPY ALERT CARD at check-in  to the Emergency Department and triage nurse.  Should you have questions after your visit or need to cancel or reschedule your appointment, please contact [facility]  Dept: [phone]  and follow the prompts.  Office hours are 8:00 a.m. to 4:30 p.m. Monday - Friday. Please note that voicemails left after 4:00 p.m. may not be returned until the following business day.  We are closed weekends and major holidays. You have access to a nurse at all times for urgent questions. Please call the main number to the clinic Dept: [phone] and follow the prompts.   For any non-urgent questions, you may also contact your provider using MyChart. We now offer e-Visits for anyone 103 and older to request care online for non-urgent symptoms. For details visit [email or web address].   Also download the MyChart app! Go to the app store, search "MyChart", open the app, select [facility], and log in with your MyChart username and password.  Masks are optional in the cancer centers. If you would like for your care team to wear a mask while they are taking care of you, please let them know. For doctor visits, patients may have with them one support person who is at least 76 years old. At this time, visitors are not allowed in the infusion area.

## 2022-06-14 ENCOUNTER — Other Ambulatory Visit: Payer: Self-pay

## 2022-06-14 ENCOUNTER — Inpatient Hospital Stay: Payer: Medicare Other

## 2022-06-14 VITALS — BP 114/74 | HR 92 | Temp 97.7°F | Resp 18

## 2022-06-14 DIAGNOSIS — C931 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: Secondary | ICD-10-CM | POA: Diagnosis not present

## 2022-06-14 DIAGNOSIS — Z79899 Other long term (current) drug therapy: Secondary | ICD-10-CM | POA: Diagnosis not present

## 2022-06-14 DIAGNOSIS — Z5111 Encounter for antineoplastic chemotherapy: Secondary | ICD-10-CM | POA: Diagnosis not present

## 2022-06-14 MED ORDER — SODIUM CHLORIDE 0.9 % IV SOLN: Freq: Once | INTRAVENOUS | Status: AC

## 2022-06-14 MED ORDER — SODIUM CHLORIDE 0.9% FLUSH
10.0000 mL | INTRAVENOUS | Status: DC | PRN
  Administered 2022-06-14: 10 mL

## 2022-06-14 MED ORDER — SODIUM CHLORIDE 0.9 % IV SOLN
10.0000 mg | Freq: Once | INTRAVENOUS | Status: AC
  Administered 2022-06-14: 10 mg via INTRAVENOUS
  Filled 2022-06-14: qty 10

## 2022-06-14 MED ORDER — SODIUM CHLORIDE 0.9 % IV SOLN
75.0000 mg/m2 | Freq: Once | INTRAVENOUS | Status: AC
  Administered 2022-06-14: 120 mg via INTRAVENOUS
  Filled 2022-06-14: qty 12

## 2022-06-14 MED ORDER — HEPARIN SOD (PORK) LOCK FLUSH 100 UNIT/ML IV SOLN
500.0000 [IU] | Freq: Once | INTRAVENOUS | Status: AC | PRN
  Administered 2022-06-14: 500 [IU]

## 2022-06-14 MED ORDER — PALONOSETRON HCL INJECTION 0.25 MG/5ML
0.2500 mg | Freq: Once | INTRAVENOUS | Status: AC
  Administered 2022-06-14: 0.25 mg via INTRAVENOUS
  Filled 2022-06-14: qty 5

## 2022-06-14 MED FILL — Dexamethasone Sodium Phosphate Inj 100 MG/10ML: INTRAMUSCULAR | Qty: 1 | Status: AC

## 2022-06-14 NOTE — Patient Instructions (Signed)
[facility]  Discharge Instructions: Thank you for choosing [facility] to provide your oncology and hematology care.   If you have a lab appointment with the [facility], please go directly to the [facility] and check in at the registration area.   Wear comfortable clothing and clothing appropriate for easy access to any Portacath or PICC line.   We strive to give you quality time with your provider. You may need to reschedule your appointment if you arrive late (15 or more minutes).  Arriving late affects you and other patients whose appointments are after yours.  Also, if you miss three or more appointments without notifying the office, you may be dismissed from the clinic at the provider's discretion.      For prescription refill requests, have your pharmacy contact our office and allow 72 hours for refills to be completed.    Today you received the following chemotherapy and/or immunotherapy agents: Azacitidine.       To help prevent nausea and vomiting after your treatment, we encourage you to take your nausea medication as directed.  BELOW ARE SYMPTOMS THAT SHOULD BE REPORTED IMMEDIATELY: *FEVER GREATER THAN 100.4 F (38 C) OR HIGHER *CHILLS OR SWEATING *NAUSEA AND VOMITING THAT IS NOT CONTROLLED WITH YOUR NAUSEA MEDICATION *UNUSUAL SHORTNESS OF BREATH *UNUSUAL BRUISING OR BLEEDING *URINARY PROBLEMS (pain or burning when urinating, or frequent urination) *BOWEL PROBLEMS (unusual diarrhea, constipation, pain near the anus) TENDERNESS IN MOUTH AND THROAT WITH OR WITHOUT PRESENCE OF ULCERS (sore throat, sores in mouth, or a toothache) UNUSUAL RASH, SWELLING OR PAIN  UNUSUAL VAGINAL DISCHARGE OR ITCHING   Items with * indicate a potential emergency and should be followed up as soon as possible or go to the Emergency Department if any problems should occur.  Please show the CHEMOTHERAPY ALERT CARD or IMMUNOTHERAPY ALERT CARD at check-in  to the Emergency Department and triage nurse.  Should you have questions after your visit or need to cancel or reschedule your appointment, please contact [facility]  Dept: [phone]  and follow the prompts.  Office hours are 8:00 a.m. to 4:30 p.m. Monday - Friday. Please note that voicemails left after 4:00 p.m. may not be returned until the following business day.  We are closed weekends and major holidays. You have access to a nurse at all times for urgent questions. Please call the main number to the clinic Dept: [phone] and follow the prompts.   For any non-urgent questions, you may also contact your provider using MyChart. We now offer e-Visits for anyone 66 and older to request care online for non-urgent symptoms. For details visit [email or web address].   Also download the MyChart app! Go to the app store, search "MyChart", open the app, select [facility], and log in with your MyChart username and password.  Masks are optional in the cancer centers. If you would like for your care team to wear a mask while they are taking care of you, please let them know. For doctor visits, patients may have with them one support person who is at least 76 years old. At this time, visitors are not allowed in the infusion area.

## 2022-06-14 NOTE — Progress Notes (Signed)
Spoke to Dr. Burr Medico about patient variability in heart rate (28-154 with no sustained highs or lows). Patient took her heart meds this am. Patient feeling normal except fatigue. Ok to treat today per Dr. Burr Medico. Dr. Burr Medico to see her in infusion. Patient heart sounds do sound like A.fib. No pain or discomfort in her chest, able to take deep breaths. BP and oxygen sats are good.  BP (!) 116/94 (BP Location: Right Arm, Patient Position: Sitting)   Pulse 88 Comment: HR varying from 28-154 (no sustained high or low- MD contacted)  Temp 98.1 F (36.7 C) (Oral)   Resp 18   SpO2 98%

## 2022-06-15 ENCOUNTER — Inpatient Hospital Stay: Payer: Medicare Other

## 2022-06-15 ENCOUNTER — Telehealth: Payer: Self-pay | Admitting: Hematology

## 2022-06-15 VITALS — BP 125/93 | HR 93 | Temp 97.7°F | Resp 18

## 2022-06-15 DIAGNOSIS — I5033 Acute on chronic diastolic (congestive) heart failure: Secondary | ICD-10-CM | POA: Diagnosis not present

## 2022-06-15 DIAGNOSIS — Z5111 Encounter for antineoplastic chemotherapy: Secondary | ICD-10-CM | POA: Diagnosis not present

## 2022-06-15 DIAGNOSIS — N1831 Chronic kidney disease, stage 3a: Secondary | ICD-10-CM | POA: Diagnosis not present

## 2022-06-15 DIAGNOSIS — C931 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: Secondary | ICD-10-CM

## 2022-06-15 DIAGNOSIS — I130 Hypertensive heart and chronic kidney disease with heart failure and stage 1 through stage 4 chronic kidney disease, or unspecified chronic kidney disease: Secondary | ICD-10-CM | POA: Diagnosis not present

## 2022-06-15 DIAGNOSIS — I5022 Chronic systolic (congestive) heart failure: Secondary | ICD-10-CM | POA: Diagnosis not present

## 2022-06-15 DIAGNOSIS — Z79899 Other long term (current) drug therapy: Secondary | ICD-10-CM | POA: Diagnosis not present

## 2022-06-15 DIAGNOSIS — D630 Anemia in neoplastic disease: Secondary | ICD-10-CM | POA: Diagnosis not present

## 2022-06-15 MED ORDER — SODIUM CHLORIDE 0.9 % IV SOLN
75.0000 mg/m2 | Freq: Once | INTRAVENOUS | Status: AC
  Administered 2022-06-15: 120 mg via INTRAVENOUS
  Filled 2022-06-15: qty 12

## 2022-06-15 MED ORDER — HEPARIN SOD (PORK) LOCK FLUSH 100 UNIT/ML IV SOLN
500.0000 [IU] | Freq: Once | INTRAVENOUS | Status: AC | PRN
  Administered 2022-06-15: 500 [IU]

## 2022-06-15 MED ORDER — SODIUM CHLORIDE 0.9 % IV SOLN: Freq: Once | INTRAVENOUS | Status: AC

## 2022-06-15 MED ORDER — SODIUM CHLORIDE 0.9% FLUSH
10.0000 mL | INTRAVENOUS | Status: DC | PRN
  Administered 2022-06-15: 10 mL

## 2022-06-15 MED ORDER — SODIUM CHLORIDE 0.9 % IV SOLN
10.0000 mg | Freq: Once | INTRAVENOUS | Status: AC
  Administered 2022-06-15: 10 mg via INTRAVENOUS
  Filled 2022-06-15: qty 10

## 2022-06-15 NOTE — Patient Instructions (Signed)
[facility]COL[facility]  Discharge Instructions: Thank you for choosing [facility] to provide your oncology and hematology care.   If you have a lab appointment with the [facility], please go directly to the [facility] and check in at the registration area.   Wear comfortable clothing and clothing appropriate for easy access to any Portacath or PICC line.   We strive to give you quality time with your provider. You may need to reschedule your appointment if you arrive late (15 or more minutes).  Arriving late affects you and other patients whose appointments are after yours.  Also, if you miss three or more appointments without notifying the office, you may be dismissed from the clinic at the provider's discretion.      For prescription refill requests, have your pharmacy contact our office and allow 72 hours for refills to be completed.    Today you received the following chemotherapy and/or immunotherapy agents: Azacitidine.       To help prevent nausea and vomiting after your treatment, we encourage you to take your nausea medication as directed.  BELOW ARE SYMPTOMS THAT SHOULD BE REPORTED IMMEDIATELY: *FEVER GREATER THAN 100.4 F (38 C) OR HIGHER *CHILLS OR SWEATING *NAUSEA AND VOMITING THAT IS NOT CONTROLLED WITH YOUR NAUSEA MEDICATION *UNUSUAL SHORTNESS OF BREATH *UNUSUAL BRUISING OR BLEEDING *URINARY PROBLEMS (pain or burning when urinating, or frequent urination) *BOWEL PROBLEMS (unusual diarrhea, constipation, pain near the anus) TENDERNESS IN MOUTH AND THROAT WITH OR WITHOUT PRESENCE OF ULCERS (sore throat, sores in mouth, or a toothache) UNUSUAL RASH, SWELLING OR PAIN  UNUSUAL VAGINAL DISCHARGE OR ITCHING   Items with * indicate a potential emergency and should be followed up as soon as possible or go to the Emergency Department if any problems should occur.  Please show the CHEMOTHERAPY ALERT CARD or IMMUNOTHERAPY ALERT CARD at check-in  to the Emergency Department and triage nurse.  Should you have questions after your visit or need to cancel or reschedule your appointment, please contact [facility]  Dept: [phone]  and follow the prompts.  Office hours are 8:00 a.m. to 4:30 p.m. Monday - Friday. Please note that voicemails left after 4:00 p.m. may not be returned until the following business day.  We are closed weekends and major holidays. You have access to a nurse at all times for urgent questions. Please call the main number to the clinic Dept: [phone] and follow the prompts.   For any non-urgent questions, you may also contact your provider using MyChart. We now offer e-Visits for anyone 12 and older to request care online for non-urgent symptoms. For details visit [email or web address].   Also download the MyChart app! Go to the app store, search "MyChart", open the app, select [facility], and log in with your MyChart username and password.  Masks are optional in the cancer centers. If you would like for your care team to wear a mask while they are taking care of you, please let them know. For doctor visits, patients may have with them one support person who is at least 76 years old. At this time, visitors are not allowed in the infusion area.

## 2022-06-15 NOTE — Telephone Encounter (Signed)
Scheduled follow-up appointments per 6/19 los. Patient is aware.

## 2022-06-16 ENCOUNTER — Inpatient Hospital Stay: Payer: Medicare Other

## 2022-06-16 ENCOUNTER — Other Ambulatory Visit: Payer: Self-pay

## 2022-06-16 ENCOUNTER — Encounter: Payer: Self-pay | Admitting: Cardiovascular Disease

## 2022-06-16 VITALS — BP 126/76 | HR 75 | Temp 97.8°F | Resp 18 | Wt 119.8 lb

## 2022-06-16 DIAGNOSIS — R933 Abnormal findings on diagnostic imaging of other parts of digestive tract: Secondary | ICD-10-CM | POA: Diagnosis not present

## 2022-06-16 DIAGNOSIS — I4891 Unspecified atrial fibrillation: Secondary | ICD-10-CM | POA: Diagnosis not present

## 2022-06-16 DIAGNOSIS — N390 Urinary tract infection, site not specified: Secondary | ICD-10-CM | POA: Diagnosis present

## 2022-06-16 DIAGNOSIS — I5033 Acute on chronic diastolic (congestive) heart failure: Secondary | ICD-10-CM | POA: Diagnosis not present

## 2022-06-16 DIAGNOSIS — D6959 Other secondary thrombocytopenia: Secondary | ICD-10-CM | POA: Diagnosis not present

## 2022-06-16 DIAGNOSIS — D630 Anemia in neoplastic disease: Secondary | ICD-10-CM | POA: Diagnosis not present

## 2022-06-16 DIAGNOSIS — J449 Chronic obstructive pulmonary disease, unspecified: Secondary | ICD-10-CM | POA: Diagnosis present

## 2022-06-16 DIAGNOSIS — K5903 Drug induced constipation: Secondary | ICD-10-CM | POA: Diagnosis not present

## 2022-06-16 DIAGNOSIS — D539 Nutritional anemia, unspecified: Secondary | ICD-10-CM | POA: Diagnosis not present

## 2022-06-16 DIAGNOSIS — R197 Diarrhea, unspecified: Secondary | ICD-10-CM | POA: Diagnosis not present

## 2022-06-16 DIAGNOSIS — Z6820 Body mass index (BMI) 20.0-20.9, adult: Secondary | ICD-10-CM | POA: Diagnosis not present

## 2022-06-16 DIAGNOSIS — I5042 Chronic combined systolic (congestive) and diastolic (congestive) heart failure: Secondary | ICD-10-CM | POA: Diagnosis not present

## 2022-06-16 DIAGNOSIS — C931 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: Secondary | ICD-10-CM | POA: Diagnosis not present

## 2022-06-16 DIAGNOSIS — Z20822 Contact with and (suspected) exposure to covid-19: Secondary | ICD-10-CM | POA: Diagnosis not present

## 2022-06-16 DIAGNOSIS — I779 Disorder of arteries and arterioles, unspecified: Secondary | ICD-10-CM | POA: Diagnosis present

## 2022-06-16 DIAGNOSIS — I130 Hypertensive heart and chronic kidney disease with heart failure and stage 1 through stage 4 chronic kidney disease, or unspecified chronic kidney disease: Secondary | ICD-10-CM | POA: Diagnosis not present

## 2022-06-16 DIAGNOSIS — J811 Chronic pulmonary edema: Secondary | ICD-10-CM | POA: Diagnosis not present

## 2022-06-16 DIAGNOSIS — B952 Enterococcus as the cause of diseases classified elsewhere: Secondary | ICD-10-CM | POA: Diagnosis not present

## 2022-06-16 DIAGNOSIS — D61810 Antineoplastic chemotherapy induced pancytopenia: Secondary | ICD-10-CM | POA: Diagnosis not present

## 2022-06-16 DIAGNOSIS — K5900 Constipation, unspecified: Secondary | ICD-10-CM | POA: Diagnosis not present

## 2022-06-16 DIAGNOSIS — E1122 Type 2 diabetes mellitus with diabetic chronic kidney disease: Secondary | ICD-10-CM | POA: Diagnosis not present

## 2022-06-16 DIAGNOSIS — R1084 Generalized abdominal pain: Secondary | ICD-10-CM | POA: Diagnosis not present

## 2022-06-16 DIAGNOSIS — R338 Other retention of urine: Secondary | ICD-10-CM | POA: Diagnosis not present

## 2022-06-16 DIAGNOSIS — T451X5A Adverse effect of antineoplastic and immunosuppressive drugs, initial encounter: Secondary | ICD-10-CM | POA: Diagnosis present

## 2022-06-16 DIAGNOSIS — E1165 Type 2 diabetes mellitus with hyperglycemia: Secondary | ICD-10-CM | POA: Diagnosis present

## 2022-06-16 DIAGNOSIS — N1831 Chronic kidney disease, stage 3a: Secondary | ICD-10-CM | POA: Diagnosis not present

## 2022-06-16 DIAGNOSIS — R109 Unspecified abdominal pain: Secondary | ICD-10-CM | POA: Diagnosis not present

## 2022-06-16 DIAGNOSIS — E440 Moderate protein-calorie malnutrition: Secondary | ICD-10-CM | POA: Diagnosis not present

## 2022-06-16 DIAGNOSIS — M069 Rheumatoid arthritis, unspecified: Secondary | ICD-10-CM | POA: Diagnosis present

## 2022-06-16 DIAGNOSIS — D649 Anemia, unspecified: Secondary | ICD-10-CM | POA: Diagnosis not present

## 2022-06-16 DIAGNOSIS — I4819 Other persistent atrial fibrillation: Secondary | ICD-10-CM | POA: Diagnosis not present

## 2022-06-16 DIAGNOSIS — T380X5A Adverse effect of glucocorticoids and synthetic analogues, initial encounter: Secondary | ICD-10-CM | POA: Diagnosis present

## 2022-06-16 DIAGNOSIS — I7090 Unspecified atherosclerosis: Secondary | ICD-10-CM | POA: Diagnosis not present

## 2022-06-16 DIAGNOSIS — N179 Acute kidney failure, unspecified: Secondary | ICD-10-CM | POA: Diagnosis not present

## 2022-06-16 DIAGNOSIS — J90 Pleural effusion, not elsewhere classified: Secondary | ICD-10-CM | POA: Diagnosis not present

## 2022-06-16 DIAGNOSIS — I129 Hypertensive chronic kidney disease with stage 1 through stage 4 chronic kidney disease, or unspecified chronic kidney disease: Secondary | ICD-10-CM | POA: Diagnosis not present

## 2022-06-16 DIAGNOSIS — D638 Anemia in other chronic diseases classified elsewhere: Secondary | ICD-10-CM | POA: Diagnosis not present

## 2022-06-16 DIAGNOSIS — I5022 Chronic systolic (congestive) heart failure: Secondary | ICD-10-CM | POA: Diagnosis not present

## 2022-06-16 DIAGNOSIS — N189 Chronic kidney disease, unspecified: Secondary | ICD-10-CM | POA: Diagnosis not present

## 2022-06-16 MED ORDER — PALONOSETRON HCL INJECTION 0.25 MG/5ML
0.2500 mg | Freq: Once | INTRAVENOUS | Status: AC
  Administered 2022-06-16: 0.25 mg via INTRAVENOUS
  Filled 2022-06-16: qty 5

## 2022-06-16 MED ORDER — SODIUM CHLORIDE 0.9 % IV SOLN: Freq: Once | INTRAVENOUS | Status: AC

## 2022-06-16 MED ORDER — SODIUM CHLORIDE 0.9% FLUSH
10.0000 mL | INTRAVENOUS | Status: DC | PRN
  Administered 2022-06-16: 10 mL

## 2022-06-16 MED ORDER — SODIUM CHLORIDE 0.9 % IV SOLN
10.0000 mg | Freq: Once | INTRAVENOUS | Status: AC
  Administered 2022-06-16: 10 mg via INTRAVENOUS
  Filled 2022-06-16: qty 10

## 2022-06-16 MED ORDER — HEPARIN SOD (PORK) LOCK FLUSH 100 UNIT/ML IV SOLN
500.0000 [IU] | Freq: Once | INTRAVENOUS | Status: AC | PRN
  Administered 2022-06-16: 500 [IU]

## 2022-06-16 MED ORDER — SODIUM CHLORIDE 0.9 % IV SOLN
75.0000 mg/m2 | Freq: Once | INTRAVENOUS | Status: AC
  Administered 2022-06-16: 120 mg via INTRAVENOUS
  Filled 2022-06-16: qty 12

## 2022-06-19 ENCOUNTER — Other Ambulatory Visit: Payer: Self-pay

## 2022-06-19 ENCOUNTER — Encounter (HOSPITAL_BASED_OUTPATIENT_CLINIC_OR_DEPARTMENT_OTHER): Payer: Self-pay | Admitting: Obstetrics and Gynecology

## 2022-06-19 ENCOUNTER — Emergency Department (HOSPITAL_BASED_OUTPATIENT_CLINIC_OR_DEPARTMENT_OTHER): Payer: Medicare Other

## 2022-06-19 ENCOUNTER — Inpatient Hospital Stay (HOSPITAL_BASED_OUTPATIENT_CLINIC_OR_DEPARTMENT_OTHER)
Admission: EM | Admit: 2022-06-19 | Discharge: 2022-06-23 | DRG: 308 | Disposition: A | Discharge status: Home Health | Payer: Medicare Other | Attending: Internal Medicine | Admitting: Internal Medicine

## 2022-06-19 DIAGNOSIS — R338 Other retention of urine: Secondary | ICD-10-CM | POA: Diagnosis not present

## 2022-06-19 DIAGNOSIS — Z20822 Contact with and (suspected) exposure to covid-19: Secondary | ICD-10-CM | POA: Diagnosis present

## 2022-06-19 DIAGNOSIS — Z79899 Other long term (current) drug therapy: Secondary | ICD-10-CM

## 2022-06-19 DIAGNOSIS — Z87891 Personal history of nicotine dependence: Secondary | ICD-10-CM

## 2022-06-19 DIAGNOSIS — Z888 Allergy status to other drugs, medicaments and biological substances status: Secondary | ICD-10-CM

## 2022-06-19 DIAGNOSIS — R109 Unspecified abdominal pain: Principal | ICD-10-CM

## 2022-06-19 DIAGNOSIS — Z96611 Presence of right artificial shoulder joint: Secondary | ICD-10-CM | POA: Diagnosis present

## 2022-06-19 DIAGNOSIS — Z8249 Family history of ischemic heart disease and other diseases of the circulatory system: Secondary | ICD-10-CM

## 2022-06-19 DIAGNOSIS — J811 Chronic pulmonary edema: Secondary | ICD-10-CM | POA: Diagnosis not present

## 2022-06-19 DIAGNOSIS — J449 Chronic obstructive pulmonary disease, unspecified: Secondary | ICD-10-CM | POA: Diagnosis present

## 2022-06-19 DIAGNOSIS — J90 Pleural effusion, not elsewhere classified: Secondary | ICD-10-CM | POA: Diagnosis present

## 2022-06-19 DIAGNOSIS — N390 Urinary tract infection, site not specified: Secondary | ICD-10-CM | POA: Diagnosis present

## 2022-06-19 DIAGNOSIS — M069 Rheumatoid arthritis, unspecified: Secondary | ICD-10-CM | POA: Diagnosis present

## 2022-06-19 DIAGNOSIS — T380X5A Adverse effect of glucocorticoids and synthetic analogues, initial encounter: Secondary | ICD-10-CM | POA: Diagnosis present

## 2022-06-19 DIAGNOSIS — K5903 Drug induced constipation: Secondary | ICD-10-CM | POA: Diagnosis not present

## 2022-06-19 DIAGNOSIS — E440 Moderate protein-calorie malnutrition: Secondary | ICD-10-CM | POA: Diagnosis present

## 2022-06-19 DIAGNOSIS — E1165 Type 2 diabetes mellitus with hyperglycemia: Secondary | ICD-10-CM | POA: Diagnosis present

## 2022-06-19 DIAGNOSIS — I4891 Unspecified atrial fibrillation: Secondary | ICD-10-CM | POA: Diagnosis present

## 2022-06-19 DIAGNOSIS — E1122 Type 2 diabetes mellitus with diabetic chronic kidney disease: Secondary | ICD-10-CM | POA: Diagnosis present

## 2022-06-19 DIAGNOSIS — K5900 Constipation, unspecified: Secondary | ICD-10-CM | POA: Diagnosis present

## 2022-06-19 DIAGNOSIS — I7090 Unspecified atherosclerosis: Secondary | ICD-10-CM | POA: Diagnosis present

## 2022-06-19 DIAGNOSIS — Z880 Allergy status to penicillin: Secondary | ICD-10-CM

## 2022-06-19 DIAGNOSIS — N179 Acute kidney failure, unspecified: Secondary | ICD-10-CM | POA: Diagnosis present

## 2022-06-19 DIAGNOSIS — M62838 Other muscle spasm: Secondary | ICD-10-CM | POA: Diagnosis present

## 2022-06-19 DIAGNOSIS — Z6820 Body mass index (BMI) 20.0-20.9, adult: Secondary | ICD-10-CM

## 2022-06-19 DIAGNOSIS — E119 Type 2 diabetes mellitus without complications: Secondary | ICD-10-CM | POA: Insufficient documentation

## 2022-06-19 DIAGNOSIS — I4819 Other persistent atrial fibrillation: Principal | ICD-10-CM | POA: Diagnosis present

## 2022-06-19 DIAGNOSIS — I5033 Acute on chronic diastolic (congestive) heart failure: Secondary | ICD-10-CM | POA: Diagnosis not present

## 2022-06-19 DIAGNOSIS — D630 Anemia in neoplastic disease: Secondary | ICD-10-CM | POA: Diagnosis present

## 2022-06-19 DIAGNOSIS — D638 Anemia in other chronic diseases classified elsewhere: Secondary | ICD-10-CM | POA: Diagnosis present

## 2022-06-19 DIAGNOSIS — E861 Hypovolemia: Secondary | ICD-10-CM | POA: Diagnosis present

## 2022-06-19 DIAGNOSIS — I779 Disorder of arteries and arterioles, unspecified: Secondary | ICD-10-CM | POA: Diagnosis present

## 2022-06-19 DIAGNOSIS — R1084 Generalized abdominal pain: Secondary | ICD-10-CM | POA: Diagnosis not present

## 2022-06-19 DIAGNOSIS — I5042 Chronic combined systolic (congestive) and diastolic (congestive) heart failure: Secondary | ICD-10-CM | POA: Diagnosis present

## 2022-06-19 DIAGNOSIS — Z9181 History of falling: Secondary | ICD-10-CM

## 2022-06-19 DIAGNOSIS — Z91018 Allergy to other foods: Secondary | ICD-10-CM

## 2022-06-19 DIAGNOSIS — I5022 Chronic systolic (congestive) heart failure: Secondary | ICD-10-CM | POA: Diagnosis not present

## 2022-06-19 DIAGNOSIS — Z8701 Personal history of pneumonia (recurrent): Secondary | ICD-10-CM

## 2022-06-19 DIAGNOSIS — D539 Nutritional anemia, unspecified: Secondary | ICD-10-CM | POA: Diagnosis present

## 2022-06-19 DIAGNOSIS — I129 Hypertensive chronic kidney disease with stage 1 through stage 4 chronic kidney disease, or unspecified chronic kidney disease: Secondary | ICD-10-CM | POA: Diagnosis not present

## 2022-06-19 DIAGNOSIS — D61810 Antineoplastic chemotherapy induced pancytopenia: Secondary | ICD-10-CM | POA: Diagnosis present

## 2022-06-19 DIAGNOSIS — Z79620 Long term (current) use of immunosuppressive biologic: Secondary | ICD-10-CM

## 2022-06-19 DIAGNOSIS — D61818 Other pancytopenia: Secondary | ICD-10-CM | POA: Diagnosis present

## 2022-06-19 DIAGNOSIS — B952 Enterococcus as the cause of diseases classified elsewhere: Secondary | ICD-10-CM | POA: Diagnosis present

## 2022-06-19 DIAGNOSIS — D6959 Other secondary thrombocytopenia: Secondary | ICD-10-CM | POA: Diagnosis present

## 2022-06-19 DIAGNOSIS — Z833 Family history of diabetes mellitus: Secondary | ICD-10-CM

## 2022-06-19 DIAGNOSIS — D696 Thrombocytopenia, unspecified: Secondary | ICD-10-CM | POA: Diagnosis present

## 2022-06-19 DIAGNOSIS — T451X5A Adverse effect of antineoplastic and immunosuppressive drugs, initial encounter: Secondary | ICD-10-CM | POA: Diagnosis present

## 2022-06-19 DIAGNOSIS — R197 Diarrhea, unspecified: Secondary | ICD-10-CM | POA: Diagnosis not present

## 2022-06-19 DIAGNOSIS — I130 Hypertensive heart and chronic kidney disease with heart failure and stage 1 through stage 4 chronic kidney disease, or unspecified chronic kidney disease: Secondary | ICD-10-CM | POA: Diagnosis present

## 2022-06-19 DIAGNOSIS — C931 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: Secondary | ICD-10-CM | POA: Diagnosis present

## 2022-06-19 DIAGNOSIS — I5023 Acute on chronic systolic (congestive) heart failure: Secondary | ICD-10-CM | POA: Diagnosis present

## 2022-06-19 DIAGNOSIS — N1831 Chronic kidney disease, stage 3a: Secondary | ICD-10-CM | POA: Diagnosis present

## 2022-06-19 DIAGNOSIS — E876 Hypokalemia: Secondary | ICD-10-CM | POA: Diagnosis present

## 2022-06-19 DIAGNOSIS — D649 Anemia, unspecified: Secondary | ICD-10-CM | POA: Diagnosis not present

## 2022-06-19 DIAGNOSIS — N189 Chronic kidney disease, unspecified: Secondary | ICD-10-CM | POA: Diagnosis not present

## 2022-06-19 DIAGNOSIS — R0989 Other specified symptoms and signs involving the circulatory and respiratory systems: Secondary | ICD-10-CM | POA: Diagnosis present

## 2022-06-19 DIAGNOSIS — R933 Abnormal findings on diagnostic imaging of other parts of digestive tract: Secondary | ICD-10-CM | POA: Diagnosis not present

## 2022-06-19 DIAGNOSIS — E785 Hyperlipidemia, unspecified: Secondary | ICD-10-CM | POA: Diagnosis present

## 2022-06-19 DIAGNOSIS — Z7952 Long term (current) use of systemic steroids: Secondary | ICD-10-CM

## 2022-06-19 LAB — COMPREHENSIVE METABOLIC PANEL
ALT: 14 U/L (ref 0–44)
AST: 12 U/L — ABNORMAL LOW (ref 15–41)
Albumin: 3.6 g/dL (ref 3.5–5.0)
Alkaline Phosphatase: 40 U/L (ref 38–126)
Anion gap: 10 (ref 5–15)
BUN: 31 mg/dL — ABNORMAL HIGH (ref 8–23)
CO2: 27 mmol/L (ref 22–32)
Calcium: 8.8 mg/dL — ABNORMAL LOW (ref 8.9–10.3)
Chloride: 99 mmol/L (ref 98–111)
Creatinine, Ser: 1.05 mg/dL — ABNORMAL HIGH (ref 0.44–1.00)
GFR, Estimated: 55 mL/min — ABNORMAL LOW (ref >60)
Glucose, Bld: 148 mg/dL — ABNORMAL HIGH (ref 70–99)
Potassium: 4 mmol/L (ref 3.5–5.1)
Sodium: 136 mmol/L (ref 135–145)
Total Bilirubin: 1.4 mg/dL — ABNORMAL HIGH (ref 0.3–1.2)
Total Protein: 5.4 g/dL — ABNORMAL LOW (ref 6.5–8.1)

## 2022-06-19 LAB — CBC
HCT: 29.4 % — ABNORMAL LOW (ref 36.0–46.0)
Hemoglobin: 9.3 g/dL — ABNORMAL LOW (ref 12.0–15.0)
MCH: 34.2 pg — ABNORMAL HIGH (ref 26.0–34.0)
MCHC: 31.6 g/dL (ref 30.0–36.0)
MCV: 108.1 fL — ABNORMAL HIGH (ref 80.0–100.0)
Platelets: 63 10*3/uL — ABNORMAL LOW (ref 150–400)
RBC: 2.72 MIL/uL — ABNORMAL LOW (ref 3.87–5.11)
RDW: 19.9 % — ABNORMAL HIGH (ref 11.5–15.5)
WBC: 14.8 10*3/uL — ABNORMAL HIGH (ref 4.0–10.5)
nRBC: 0.1 % (ref 0.0–0.2)

## 2022-06-19 LAB — DIFFERENTIAL
Abs Immature Granulocytes: 0.1 10*3/uL — ABNORMAL HIGH (ref 0.00–0.07)
Basophils Absolute: 0 10*3/uL (ref 0.0–0.1)
Basophils Relative: 0 %
Eosinophils Absolute: 0.1 10*3/uL (ref 0.0–0.5)
Eosinophils Relative: 1 %
Immature Granulocytes: 1 %
Lymphocytes Relative: 9 %
Lymphs Abs: 1.3 10*3/uL (ref 0.7–4.0)
Monocytes Absolute: 2.8 10*3/uL — ABNORMAL HIGH (ref 0.1–1.0)
Monocytes Relative: 19 %
Neutro Abs: 10.3 10*3/uL — ABNORMAL HIGH (ref 1.7–7.7)
Neutrophils Relative %: 70 %

## 2022-06-19 LAB — RETICULOCYTES
Immature Retic Fract: 20 % — ABNORMAL HIGH (ref 2.3–15.9)
RBC.: 2.75 MIL/uL — ABNORMAL LOW (ref 3.87–5.11)
Retic Count, Absolute: 69 10*3/uL (ref 19.0–186.0)
Retic Ct Pct: 2.5 % (ref 0.4–3.1)

## 2022-06-19 LAB — LACTIC ACID, PLASMA
Lactic Acid, Venous: 0.7 mmol/L (ref 0.5–1.9)
Lactic Acid, Venous: 1.9 mmol/L (ref 0.5–1.9)

## 2022-06-19 LAB — TROPONIN I (HIGH SENSITIVITY)
Troponin I (High Sensitivity): 18 ng/L — ABNORMAL HIGH (ref <18)
Troponin I (High Sensitivity): 24 ng/L — ABNORMAL HIGH (ref <18)

## 2022-06-19 LAB — MAGNESIUM: Magnesium: 2.1 mg/dL (ref 1.7–2.4)

## 2022-06-19 LAB — FOLATE: Folate: 11.5 ng/mL (ref >5.9)

## 2022-06-19 LAB — LIPASE, BLOOD: Lipase: 32 U/L (ref 11–51)

## 2022-06-19 LAB — PHOSPHORUS: Phosphorus: 3.1 mg/dL (ref 2.5–4.6)

## 2022-06-19 MED ORDER — ACETAMINOPHEN 650 MG RE SUPP: 650.0000 mg | Freq: Four times a day (QID) | RECTAL | Status: DC | PRN

## 2022-06-19 MED ORDER — INSULIN ASPART 100 UNIT/ML IJ SOLN
0.0000 [IU] | INTRAMUSCULAR | Status: DC
  Administered 2022-06-20 – 2022-06-21 (×3): 1 [IU] via SUBCUTANEOUS

## 2022-06-19 MED ORDER — ONDANSETRON HCL 4 MG PO TABS: 4.0000 mg | ORAL_TABLET | Freq: Four times a day (QID) | ORAL | Status: DC | PRN

## 2022-06-19 MED ORDER — HYDROCODONE-ACETAMINOPHEN 5-325 MG PO TABS
1.0000 | ORAL_TABLET | ORAL | Status: DC | PRN
  Administered 2022-06-20: 1 via ORAL
  Filled 2022-06-19: qty 1

## 2022-06-19 MED ORDER — ACETAMINOPHEN 325 MG PO TABS
650.0000 mg | ORAL_TABLET | Freq: Four times a day (QID) | ORAL | Status: DC | PRN
  Administered 2022-06-20 – 2022-06-22 (×2): 650 mg via ORAL
  Filled 2022-06-19 (×2): qty 2

## 2022-06-19 MED ORDER — METHYLPREDNISOLONE SODIUM SUCC 40 MG IJ SOLR: 10.0000 mg | Freq: Every day | INTRAMUSCULAR | Status: DC

## 2022-06-19 MED ORDER — IOHEXOL 350 MG/ML SOLN
100.0000 mL | Freq: Once | INTRAVENOUS | Status: AC | PRN
  Administered 2022-06-19: 75 mL via INTRAVENOUS

## 2022-06-19 MED ORDER — FENTANYL CITRATE PF 50 MCG/ML IJ SOSY: 12.5000 ug | PREFILLED_SYRINGE | INTRAMUSCULAR | Status: DC | PRN

## 2022-06-19 MED ORDER — ALBUTEROL SULFATE (2.5 MG/3ML) 0.083% IN NEBU: 2.5000 mg | INHALATION_SOLUTION | RESPIRATORY_TRACT | Status: DC | PRN

## 2022-06-19 MED ORDER — ONDANSETRON HCL 4 MG/2ML IJ SOLN: 4.0000 mg | Freq: Four times a day (QID) | INTRAMUSCULAR | Status: DC | PRN

## 2022-06-19 MED ORDER — TAMSULOSIN HCL 0.4 MG PO CAPS
0.4000 mg | ORAL_CAPSULE | Freq: Every day | ORAL | Status: DC
  Administered 2022-06-20 – 2022-06-22 (×3): 0.4 mg via ORAL
  Filled 2022-06-19 (×3): qty 1

## 2022-06-19 MED ORDER — DILTIAZEM HCL-DEXTROSE 125-5 MG/125ML-% IV SOLN (PREMIX)
5.0000 mg/h | INTRAVENOUS | Status: DC
  Administered 2022-06-19 (×2): 5 mg/h via INTRAVENOUS
  Filled 2022-06-19: qty 125

## 2022-06-19 MED ORDER — SODIUM CHLORIDE 0.9 % IV SOLN: INTRAVENOUS | Status: DC

## 2022-06-19 MED ORDER — SODIUM CHLORIDE 0.9 % IV BOLUS
500.0000 mL | Freq: Once | INTRAVENOUS | Status: AC
  Administered 2022-06-19: 500 mL via INTRAVENOUS

## 2022-06-19 MED ORDER — DILTIAZEM LOAD VIA INFUSION
10.0000 mg | Freq: Once | INTRAVENOUS | Status: AC
  Administered 2022-06-19: 10 mg via INTRAVENOUS
  Filled 2022-06-19: qty 10

## 2022-06-19 NOTE — ED Notes (Signed)
[facility] arrived to transport pt. Pt stable at time of departure ?

## 2022-06-19 NOTE — ED Triage Notes (Signed)
Patient reports to the ER for abdominal pain. Patient reports she has been having severe cramps. Patient reports she had an episode of diarrhea last night and she took a stool softener and suppository yesterday. Patient reports x4 days with no BM prior.

## 2022-06-19 NOTE — Subjective & Objective (Signed)
Patient reported severe abdominal cramping she have had some diarrhea last night's took stool softener prior to this she has not had a bowel movement for at least 4 days. Reports pain is acute and severe 10 out of 10 not radiating no melena no hematochezia no nausea or vomiting On arrival to [facility] patient was noted to be in A-fib with RVR up to 154 patient has history of A-fib not on anticoagulation but she does take Cardizem and metoprolol. Otherwise not endorsing any chest pain no chills no cough no fever

## 2022-06-19 NOTE — Assessment & Plan Note (Signed)
History of rheumatoid arthritis while n.p.o. changed to low-dose Solu-Medrol as patient is chronically on steroids

## 2022-06-19 NOTE — ED Notes (Signed)
Cardizem rate decreased to 2.5mg /hr per MD's VO r/t BP of 107/45 (65).

## 2022-06-20 DIAGNOSIS — I7090 Unspecified atherosclerosis: Secondary | ICD-10-CM | POA: Diagnosis present

## 2022-06-20 DIAGNOSIS — I5033 Acute on chronic diastolic (congestive) heart failure: Secondary | ICD-10-CM

## 2022-06-20 DIAGNOSIS — R338 Other retention of urine: Secondary | ICD-10-CM | POA: Diagnosis present

## 2022-06-20 DIAGNOSIS — I4891 Unspecified atrial fibrillation: Secondary | ICD-10-CM | POA: Diagnosis not present

## 2022-06-20 LAB — TROPONIN I (HIGH SENSITIVITY)
Troponin I (High Sensitivity): 26 ng/L — ABNORMAL HIGH (ref <18)
Troponin I (High Sensitivity): 24 ng/L — ABNORMAL HIGH (ref <18)

## 2022-06-20 LAB — CBC
HCT: 23.3 % — ABNORMAL LOW (ref 36.0–46.0)
Hemoglobin: 7.8 g/dL — ABNORMAL LOW (ref 12.0–15.0)
MCH: 36.6 pg — ABNORMAL HIGH (ref 26.0–34.0)
MCHC: 33.5 g/dL (ref 30.0–36.0)
MCV: 109.4 fL — ABNORMAL HIGH (ref 80.0–100.0)
Platelets: 36 10*3/uL — ABNORMAL LOW (ref 150–400)
RBC: 2.13 MIL/uL — ABNORMAL LOW (ref 3.87–5.11)
RDW: 19.9 % — ABNORMAL HIGH (ref 11.5–15.5)
WBC: 9.7 10*3/uL (ref 4.0–10.5)
nRBC: 0 % (ref 0.0–0.2)

## 2022-06-20 LAB — COMPREHENSIVE METABOLIC PANEL
ALT: 16 U/L (ref 0–44)
AST: 11 U/L — ABNORMAL LOW (ref 15–41)
Albumin: 2.5 g/dL — ABNORMAL LOW (ref 3.5–5.0)
Alkaline Phosphatase: 34 U/L — ABNORMAL LOW (ref 38–126)
Anion gap: 6 (ref 5–15)
BUN: 20 mg/dL (ref 8–23)
CO2: 27 mmol/L (ref 22–32)
Calcium: 7.6 mg/dL — ABNORMAL LOW (ref 8.9–10.3)
Chloride: 104 mmol/L (ref 98–111)
Creatinine, Ser: 0.8 mg/dL (ref 0.44–1.00)
GFR, Estimated: >60 mL/min (ref >60)
Glucose, Bld: 92 mg/dL (ref 70–99)
Potassium: 3 mmol/L — ABNORMAL LOW (ref 3.5–5.1)
Sodium: 137 mmol/L (ref 135–145)
Total Bilirubin: 1.2 mg/dL (ref 0.3–1.2)
Total Protein: 4.3 g/dL — ABNORMAL LOW (ref 6.5–8.1)

## 2022-06-20 LAB — GLUCOSE, CAPILLARY
Glucose-Capillary: 134 mg/dL — ABNORMAL HIGH (ref 70–99)
Glucose-Capillary: 140 mg/dL — ABNORMAL HIGH (ref 70–99)
Glucose-Capillary: 91 mg/dL (ref 70–99)
Glucose-Capillary: 91 mg/dL (ref 70–99)
Glucose-Capillary: 91 mg/dL (ref 70–99)
Glucose-Capillary: 95 mg/dL (ref 70–99)

## 2022-06-20 LAB — URINALYSIS, COMPLETE (UACMP) WITH MICROSCOPIC
Bilirubin Urine: NEGATIVE
Glucose, UA: NEGATIVE mg/dL
Hgb urine dipstick: NEGATIVE
Ketones, ur: NEGATIVE mg/dL
Leukocytes,Ua: NEGATIVE
Nitrite: NEGATIVE
Protein, ur: NEGATIVE mg/dL
Specific Gravity, Urine: 1.031 — ABNORMAL HIGH (ref 1.005–1.030)
pH: 6 (ref 5.0–8.0)

## 2022-06-20 LAB — IRON AND TIBC
Iron: 16 ug/dL — ABNORMAL LOW (ref 28–170)
Saturation Ratios: 10 % — ABNORMAL LOW (ref 10.4–31.8)
TIBC: 157 ug/dL — ABNORMAL LOW (ref 250–450)
UIBC: 141 ug/dL

## 2022-06-20 LAB — LACTIC ACID, PLASMA
Lactic Acid, Venous: 1.1 mmol/L (ref 0.5–1.9)
Lactic Acid, Venous: 1 mmol/L (ref 0.5–1.9)

## 2022-06-20 LAB — VITAMIN B12: Vitamin B-12: 270 pg/mL (ref 180–914)

## 2022-06-20 LAB — MAGNESIUM: Magnesium: 2.1 mg/dL (ref 1.7–2.4)

## 2022-06-20 LAB — CK: Total CK: 13 U/L — ABNORMAL LOW (ref 38–234)

## 2022-06-20 LAB — FERRITIN: Ferritin: 1584 ng/mL — ABNORMAL HIGH (ref 11–307)

## 2022-06-20 LAB — PREALBUMIN: Prealbumin: 22.1 mg/dL (ref 18–38)

## 2022-06-20 LAB — SARS CORONAVIRUS 2 BY RT PCR: SARS Coronavirus 2 by RT PCR: NEGATIVE

## 2022-06-20 MED ORDER — HYDRALAZINE HCL 20 MG/ML IJ SOLN: 10.0000 mg | INTRAMUSCULAR | Status: DC | PRN

## 2022-06-20 MED ORDER — DEXTROSE-NACL 5-0.45 % IV SOLN: INTRAVENOUS | Status: AC

## 2022-06-20 MED ORDER — DICYCLOMINE HCL 10 MG PO CAPS: 10.0000 mg | ORAL_CAPSULE | Freq: Three times a day (TID) | ORAL | Status: DC | PRN

## 2022-06-20 MED ORDER — METOPROLOL SUCCINATE ER 50 MG PO TB24
150.0000 mg | ORAL_TABLET | Freq: Two times a day (BID) | ORAL | Status: DC
  Administered 2022-06-20 – 2022-06-21 (×3): 150 mg via ORAL
  Filled 2022-06-20 (×3): qty 1

## 2022-06-20 MED ORDER — GUAIFENESIN 100 MG/5ML PO LIQD: 5.0000 mL | ORAL | Status: DC | PRN

## 2022-06-20 MED ORDER — TRAZODONE HCL 50 MG PO TABS: 50.0000 mg | ORAL_TABLET | Freq: Every evening | ORAL | Status: DC | PRN

## 2022-06-20 MED ORDER — DICLOFENAC SODIUM 1 % EX GEL
2.0000 g | Freq: Four times a day (QID) | CUTANEOUS | Status: DC
  Administered 2022-06-20 – 2022-06-21 (×7): 2 g via TOPICAL
  Filled 2022-06-20: qty 100

## 2022-06-20 MED ORDER — PREDNISONE 5 MG PO TABS
5.0000 mg | ORAL_TABLET | Freq: Every day | ORAL | Status: DC
  Administered 2022-06-21 – 2022-06-23 (×3): 5 mg via ORAL
  Filled 2022-06-20 (×4): qty 1

## 2022-06-20 MED ORDER — ENSURE ENLIVE PO LIQD
237.0000 mL | ORAL | Status: DC
  Administered 2022-06-22: 237 mL via ORAL

## 2022-06-20 MED ORDER — SENNOSIDES-DOCUSATE SODIUM 8.6-50 MG PO TABS: 1.0000 | ORAL_TABLET | Freq: Every evening | ORAL | Status: DC | PRN

## 2022-06-20 MED ORDER — IPRATROPIUM-ALBUTEROL 0.5-2.5 (3) MG/3ML IN SOLN: 3.0000 mL | RESPIRATORY_TRACT | Status: DC | PRN

## 2022-06-20 MED ORDER — CHLORHEXIDINE GLUCONATE CLOTH 2 % EX PADS
6.0000 | MEDICATED_PAD | Freq: Every day | CUTANEOUS | Status: DC
  Administered 2022-06-20 – 2022-06-23 (×4): 6 via TOPICAL

## 2022-06-20 MED ORDER — POTASSIUM CHLORIDE 10 MEQ/100ML IV SOLN
10.0000 meq | INTRAVENOUS | Status: AC
  Administered 2022-06-20 (×4): 10 meq via INTRAVENOUS
  Filled 2022-06-20 (×4): qty 100

## 2022-06-20 MED ORDER — BACLOFEN 10 MG PO TABS
10.0000 mg | ORAL_TABLET | Freq: Three times a day (TID) | ORAL | Status: DC | PRN
Start: 2022-06-20 — End: 2022-06-23

## 2022-06-20 MED ORDER — FUROSEMIDE 10 MG/ML IJ SOLN
20.0000 mg | Freq: Once | INTRAMUSCULAR | Status: AC
Start: 2022-06-20 — End: 2022-06-20
  Administered 2022-06-20: 20 mg via INTRAVENOUS
  Filled 2022-06-20: qty 2

## 2022-06-20 MED ORDER — GABAPENTIN 300 MG PO CAPS
300.0000 mg | ORAL_CAPSULE | Freq: Every morning | ORAL | Status: DC
  Administered 2022-06-20 – 2022-06-23 (×4): 300 mg via ORAL
  Filled 2022-06-20 (×4): qty 1

## 2022-06-20 MED ORDER — DILTIAZEM HCL 60 MG PO TABS
60.0000 mg | ORAL_TABLET | Freq: Three times a day (TID) | ORAL | Status: DC
  Administered 2022-06-20 – 2022-06-22 (×7): 60 mg via ORAL
  Filled 2022-06-20 (×7): qty 1

## 2022-06-20 MED ORDER — METOPROLOL TARTRATE 5 MG/5ML IV SOLN: 5.0000 mg | INTRAVENOUS | Status: DC | PRN

## 2022-06-20 NOTE — Assessment & Plan Note (Signed)
Chronic recurrent currently not on oxygen

## 2022-06-20 NOTE — Assessment & Plan Note (Signed)
Start Flomax Foley placed Would benefit from urology consult in AM.  Send message to urology at night for epic

## 2022-06-20 NOTE — Evaluation (Signed)
Physical Therapy Evaluation Patient Details Name: [name] MRN: [id number] DOB: 1946/08/15 Today's Date: 06/20/2022  History of Present Illness  Pt is a 76 y.o. female admitted 06/19/22 due to worsening abdominal pain. Workup for afib with RVR. Per general surgery consult, abdominal tenderness secondary to urinary retention; not consistent with appendicitis. 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   Clinical Impression  Pt presents with an overall decrease in functional mobility secondary to above. PTA, pt ambulatory with RW vs SPC, lives with husband who assists with ADL/iADLs as needed. Today, pt able to transfer and ambulate with RW and intermittent min guard for balance. Pt's husband present and supportive, interested in more info regarding PCA assist for home. Pt would benefit from continued acute PT services to maximize functional mobility and independence prior to d/c with continued HHPT services.     SpO2 98% on RA HR briefly 156-164 afib with ambulation    Recommendations for follow up therapy are one component of a multi-disciplinary discharge planning process, led by the attending physician.  Recommendations may be updated based on patient status, additional functional criteria and insurance authorization.  Follow Up Recommendations Home health PT (husband interested in info regarding PCA/CNA assist at home)      Assistance Recommended at Discharge Intermittent Supervision/Assistance  Patient can return home with the following  A little help with bathing/dressing/bathroom;Assistance with cooking/housework;Assist for transportation;Help with stairs or ramp for entrance    Equipment Recommendations None recommended by PT  Recommendations for Other Services   Occupational Therapy   Functional Status Assessment Patient has had a recent decline in their functional status and demonstrates the ability to make significant  improvements in function in a reasonable and predictable amount of time.     Precautions / Restrictions Precautions Precautions: Fall;Other (comment) Precaution Comments: watch HR Restrictions Weight Bearing Restrictions: No      Mobility  Bed Mobility Overal bed mobility: Modified Independent             General bed mobility comments: HOB elevated    Transfers Overall transfer level: Needs assistance Equipment used: Rolling walker (2 wheels) Transfers: Sit to/from Stand Sit to Stand: Min guard                Ambulation/Gait Ambulation/Gait assistance: Min guard Gait Distance (Feet): 120 Feet Assistive device: Rolling walker (2 wheels) Gait Pattern/deviations: Step-through pattern, Decreased stride length, Trunk flexed Gait velocity: Decreased     General Gait Details: slow, fatigued gait with RW and intermittent min guard for balance; pt endorses bilateral calf numbness/tingling and BUE fatigue limiting further distance  Stairs            Wheelchair Mobility    Modified Rankin (Stroke Patients Only)       Balance Overall balance assessment: Needs assistance   Sitting balance-Leahy Scale: Good     Standing balance support: No upper extremity supported Standing balance-Leahy Scale: Fair Standing balance comment: can static stand without UE support; preference for UE support                             Pertinent Vitals/Pain Pain Assessment Pain Assessment: Faces Faces Pain Scale: Hurts little more Pain Location: lower abdomen, bilateral knees (R>L) Pain Descriptors / Indicators: Discomfort, Sore Pain Intervention(s): Monitored during session    Home Living Family/patient expects to be discharged to:: Private residence Living Arrangements: Spouse/significant other Available  Help at Discharge: Family;Available 24 hours/day Type of Home: House Home Access: Stairs to enter Entrance Stairs-Rails: None Entrance Stairs-Number of  Steps: 3 Alternate Level Stairs-Number of Steps: flight--has chair lift Home Layout: Two level;1/2 bath on main level Home Equipment: Agricultural consultant (2 wheels);Shower seat;Hand held shower head Additional Comments: husband assists as needed, but is interested in in-home care resources to allow him to check on property he owns out of town    Prior Function Prior Level of Function : Needs assist             Mobility Comments: Reports limited ambulatory mod indep with SPC or RW (prefers [facility]); prior to 11/2021, was working, but has had worsening bilateral knee pain limiting mobility. 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 Husband assists as needed     Hand Dominance        Extremity/Trunk Assessment   Upper Extremity Assessment Upper Extremity Assessment: Generalized weakness    Lower Extremity Assessment Lower Extremity Assessment: Generalized weakness (noted bilateral lower leg edema, increased with ambulation)    Cervical / Trunk Assessment Cervical / Trunk Assessment: Kyphotic  Communication   Communication: No difficulties  Cognition Arousal/Alertness: Awake/alert Behavior During Therapy: WFL for tasks assessed/performed, Flat affect Overall Cognitive Status: Within Functional Limits for tasks assessed                                 General Comments: WFL for simple tasks, not formally assessed        General Comments General comments (skin integrity, edema, etc.): SpO2 98% on RA; HR briefly to 156-164 with ambulation. Pt's husband present and supportive    Exercises     Assessment/Plan    PT Assessment Patient needs continued PT services  PT Problem List Decreased strength;Decreased activity tolerance;Decreased balance;Decreased mobility;Cardiopulmonary status limiting activity;Pain       PT Treatment Interventions DME  instruction;Gait training;Stair training;Functional mobility training;Therapeutic activities;Therapeutic exercise;Balance training;Patient/family education    PT Goals (Current goals can be found in the Care Plan section)  Acute Rehab PT Goals Patient Stated Goal: "get solutions for what's going on" PT Goal Formulation: With patient Time For Goal Achievement: 07/04/22 Potential to Achieve Goals: Good    Frequency Min 3X/week     Co-evaluation               AM-PAC PT "6 Clicks" Mobility  Outcome Measure Help needed turning from your back to your side while in a flat bed without using bedrails?: None Help needed moving from lying on your back to sitting on the side of a flat bed without using bedrails?: A Little Help needed moving to and from a bed to a chair (including a wheelchair)?: A Little Help needed standing up from a chair using your arms (e.g., wheelchair or bedside chair)?: A Little Help needed to walk in hospital room?: A Little Help needed climbing 3-5 steps with a railing? : A Little 6 Click Score: 19    End of Session Equipment Utilized During Treatment: Gait belt Activity Tolerance: Patient tolerated treatment well Patient left: in chair;with call bell/phone within reach;with nursing/sitter in room;with family/visitor present;Other (comment) (with MD present) Nurse Communication: Mobility status PT Visit Diagnosis: Other abnormalities of gait and mobility (R26.89);Muscle weakness (generalized) (M62.81)    Time: 2956-2130 PT Time Calculation (min) (ACUTE ONLY): 20 min  Charges:   PT Evaluation $PT Eval Moderate Complexity: 1 Mod        Ina Homes, PT, DPT Acute Rehabilitation Services  Personal: Secure Chat Rehab Office: [phone]  [name] 06/20/2022, 8:44 AM

## 2022-06-20 NOTE — Plan of Care (Signed)
  Problem: Clinical Measurements: Goal: Respiratory complications will improve Outcome: Progressing   

## 2022-06-20 NOTE — Assessment & Plan Note (Signed)
Chronic-stable.

## 2022-06-21 DIAGNOSIS — I4891 Unspecified atrial fibrillation: Secondary | ICD-10-CM | POA: Diagnosis not present

## 2022-06-21 DIAGNOSIS — I4819 Other persistent atrial fibrillation: Secondary | ICD-10-CM | POA: Diagnosis not present

## 2022-06-21 LAB — COMPREHENSIVE METABOLIC PANEL
ALT: 15 U/L (ref 0–44)
AST: 13 U/L — ABNORMAL LOW (ref 15–41)
Albumin: 2.3 g/dL — ABNORMAL LOW (ref 3.5–5.0)
Alkaline Phosphatase: 29 U/L — ABNORMAL LOW (ref 38–126)
Anion gap: 6 (ref 5–15)
BUN: 11 mg/dL (ref 8–23)
CO2: 25 mmol/L (ref 22–32)
Calcium: 7.1 mg/dL — ABNORMAL LOW (ref 8.9–10.3)
Chloride: 103 mmol/L (ref 98–111)
Creatinine, Ser: 0.7 mg/dL (ref 0.44–1.00)
GFR, Estimated: >60 mL/min (ref >60)
Glucose, Bld: 110 mg/dL — ABNORMAL HIGH (ref 70–99)
Potassium: 3.6 mmol/L (ref 3.5–5.1)
Sodium: 134 mmol/L — ABNORMAL LOW (ref 135–145)
Total Bilirubin: 0.9 mg/dL (ref 0.3–1.2)
Total Protein: 3.9 g/dL — ABNORMAL LOW (ref 6.5–8.1)

## 2022-06-21 LAB — CBC
HCT: 20.3 % — ABNORMAL LOW (ref 36.0–46.0)
Hemoglobin: 6.5 g/dL — CL (ref 12.0–15.0)
MCH: 35.7 pg — ABNORMAL HIGH (ref 26.0–34.0)
MCHC: 32 g/dL (ref 30.0–36.0)
MCV: 111.5 fL — ABNORMAL HIGH (ref 80.0–100.0)
Platelets: 26 10*3/uL — CL (ref 150–400)
RBC: 1.82 MIL/uL — ABNORMAL LOW (ref 3.87–5.11)
RDW: 19.6 % — ABNORMAL HIGH (ref 11.5–15.5)
WBC: 7.2 10*3/uL (ref 4.0–10.5)
nRBC: 0 % (ref 0.0–0.2)

## 2022-06-21 LAB — GLUCOSE, CAPILLARY
Glucose-Capillary: 105 mg/dL — ABNORMAL HIGH (ref 70–99)
Glucose-Capillary: 108 mg/dL — ABNORMAL HIGH (ref 70–99)
Glucose-Capillary: 116 mg/dL — ABNORMAL HIGH (ref 70–99)
Glucose-Capillary: 123 mg/dL — ABNORMAL HIGH (ref 70–99)
Glucose-Capillary: 125 mg/dL — ABNORMAL HIGH (ref 70–99)
Glucose-Capillary: 139 mg/dL — ABNORMAL HIGH (ref 70–99)
Glucose-Capillary: 98 mg/dL (ref 70–99)

## 2022-06-21 LAB — URINE CULTURE: Culture: >=100000 — AB

## 2022-06-21 LAB — PREPARE RBC (CROSSMATCH)

## 2022-06-21 LAB — OCCULT BLOOD X 1 CARD TO LAB, STOOL: Fecal Occult Bld: NEGATIVE

## 2022-06-21 LAB — MAGNESIUM: Magnesium: 1.8 mg/dL (ref 1.7–2.4)

## 2022-06-21 MED ORDER — METOPROLOL TARTRATE 50 MG PO TABS
50.0000 mg | ORAL_TABLET | Freq: Four times a day (QID) | ORAL | Status: DC
  Administered 2022-06-21 – 2022-06-23 (×8): 50 mg via ORAL
  Filled 2022-06-21 (×8): qty 1

## 2022-06-21 MED ORDER — SODIUM CHLORIDE 0.9% FLUSH: 10.0000 mL | INTRAVENOUS | Status: DC | PRN

## 2022-06-21 MED ORDER — SODIUM CHLORIDE 0.9% FLUSH
10.0000 mL | Freq: Two times a day (BID) | INTRAVENOUS | Status: DC
  Administered 2022-06-21 – 2022-06-23 (×5): 10 mL

## 2022-06-21 MED ORDER — POLYETHYLENE GLYCOL 3350 17 G PO PACK
17.0000 g | PACK | Freq: Every day | ORAL | Status: DC
  Administered 2022-06-21: 17 g via ORAL
  Filled 2022-06-21 (×2): qty 1

## 2022-06-21 MED ORDER — SENNA 8.6 MG PO TABS
1.0000 | ORAL_TABLET | Freq: Two times a day (BID) | ORAL | Status: DC
  Administered 2022-06-21: 8.6 mg via ORAL
  Filled 2022-06-21 (×3): qty 1

## 2022-06-21 MED ORDER — SODIUM CHLORIDE 0.9% IV SOLUTION
Freq: Once | INTRAVENOUS | Status: AC
Start: 2022-06-21 — End: 2022-06-21

## 2022-06-21 MED ORDER — FOSFOMYCIN TROMETHAMINE 3 G PO PACK
3.0000 g | PACK | Freq: Once | ORAL | Status: AC
Start: 2022-06-21 — End: 2022-06-21
  Administered 2022-06-21: 3 g via ORAL
  Filled 2022-06-21: qty 3

## 2022-06-21 NOTE — Progress Notes (Signed)
Progress Note  Patient Name: [name] Date of Encounter: 06/21/2022  [facility] Cardiologist: Mertie Moores, MD   Subjective   She still has some abdominal pain she attributes to constipation. No palpitations  Inpatient Medications    Scheduled Meds:  Chlorhexidine Gluconate Cloth  6 each Topical Daily   diclofenac Sodium  2 g Topical QID   diltiazem  60 mg Oral Q8H   feeding supplement  237 mL Oral Q24H   gabapentin  300 mg Oral q AM   insulin aspart  0-9 Units Subcutaneous Q4H   metoprolol succinate  150 mg Oral BID   predniSONE  5 mg Oral Q breakfast   sodium chloride flush  10-40 mL Intracatheter Q12H   tamsulosin  0.4 mg Oral QPC supper   Continuous Infusions:  diltiazem (CARDIZEM) infusion Stopped (06/20/22 1409)   PRN Meds: acetaminophen **OR** acetaminophen, baclofen, dicyclomine, fentaNYL (SUBLIMAZE) injection, guaiFENesin, hydrALAZINE, HYDROcodone-acetaminophen, ipratropium-albuterol, metoprolol tartrate, ondansetron **OR** ondansetron (ZOFRAN) IV, senna-docusate, sodium chloride flush, traZODone   Vital Signs    Vitals:   06/21/22 0006 06/21/22 0336 06/21/22 0545 06/21/22 0734  BP: (!) 100/53 (!) 96/54 112/67 (!) 102/57  Pulse: 86 87  83  Resp: 17 18  18   Temp: 98 F (36.7 C) 98.6 F (37 C)  98.3 F (36.8 C)  TempSrc: Oral Oral  Oral  SpO2: 95% 93%  97%  Weight:      Height:        Intake/Output Summary (Last 24 hours) at 06/21/2022 1005 Last data filed at 06/21/2022 0736 Gross per 24 hour  Intake 1237.26 ml  Output 1500 ml  Net -262.74 ml      06/19/2022    4:21 PM 06/16/2022   10:51 AM 06/12/2022    9:58 AM  Last 3 Weights  Weight (lbs) 116 lb 119 lb 12.8 oz 121 lb 4.8 oz  Weight (kg) 52.617 kg 54.341 kg 55.021 kg      Telemetry    Atrial fibrillation with rapid ventricular rate- Personally Reviewed  ECG    N/a  Physical Exam   GEN: No acute distress.   Neck: No JVD Cardiac: Irregularly irregular tachycardic, no  murmurs, rubs, or gallops.  Respiratory: Diminished breath sounds. GI: Soft, nontender, non-distended  MS: no significant edema Neuro:  Nonfocal  Psych: Normal affect   Labs    High Sensitivity Troponin:   Recent Labs  Lab 06/19/22 1510 06/19/22 1838 06/20/22 0125 06/20/22 0342  TROPONINIHS 18* 24* 26* 24*     Chemistry Recent Labs  Lab 06/19/22 1221 06/19/22 1510 06/20/22 0125 06/20/22 0342 06/21/22 0305  NA 136  --  137  --  134*  K 4.0  --  3.0*  --  3.6  CL 99  --  104  --  103  CO2 27  --  27  --  25  GLUCOSE 148*  --  92  --  110*  BUN 31*  --  20  --  11  CREATININE 1.05*  --  0.80  --  0.70  CALCIUM 8.8*  --  7.6*  --  7.1*  MG  --  2.1  --  2.1 1.8  PROT 5.4*  --  4.3*  --  3.9*  ALBUMIN 3.6  --  2.5*  --  2.3*  AST 12*  --  11*  --  13*  ALT 14  --  16  --  15  ALKPHOS 40  --  34*  --  29*  BILITOT 1.4*  --  1.2  --  0.9  GFRNONAA 55*  --  >60  --  >60  ANIONGAP 10  --  6  --  6     Hematology Recent Labs  Lab 06/19/22 1221 06/20/22 0125 06/21/22 0305  WBC 14.8* 9.7 7.2  RBC 2.72*  2.75* 2.13* 1.82*  HGB 9.3* 7.8* 6.5*  HCT 29.4* 23.3* 20.3*  MCV 108.1* 109.4* 111.5*  MCH 34.2* 36.6* 35.7*  MCHC 31.6 33.5 32.0  RDW 19.9* 19.9* 19.6*  PLT 63* 36* 26*    Radiology    CT Angio Abd/Pel W and/or Wo Contrast  Result Date: 06/19/2022 CLINICAL DATA:  Mesenteric ischemia Severe abdominal pain and cramping Diarrhea and constipation Leukemia EXAM: CTA ABDOMEN AND PELVIS WITHOUT AND WITH CONTRAST TECHNIQUE: Multidetector CT imaging of the abdomen and pelvis was performed using the standard protocol during bolus administration of intravenous contrast. Multiplanar reconstructed images and MIPs were obtained and reviewed to evaluate the vascular anatomy. RADIATION DOSE REDUCTION: This exam was performed according to the departmental dose-optimization program which includes automated exposure control, adjustment of the mA and/or kV according to patient  size and/or use of iterative reconstruction technique. CONTRAST:  75 mL OMNIPAQUE IOHEXOL 350 MG/ML SOLN COMPARISON:  CT chest, abdomen, and pelvis 12/23/2021 FINDINGS: VASCULAR Aorta: Diffuse atherosclerotic calcified plaque without aneurysm or flow-limiting stenosis. 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 SMA: No significant stenosis of the superior mesenteric artery. The hepatic artery is replaced radiating from the superior mesenteric artery. There are multiple irregular branches in the region of the pancreatic body which appear to be communicating between the common hepatic and splenic arteries. A small aneurysm originating from 1 of these branches measures approximately 4 mm and is located within the pancreatic body, best seen on image 102 of series 8 and image 38 of series 4. Renals: All narrowing at the origin of the left main renal artery. Moderate to severe stenosis of the origin of the right main renal artery. 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 IMA: Moderate stenosis of the origin of the inferior mesenteric artery. Inflow: Patent without evidence of aneurysm, dissection, vasculitis or significant stenosis. Proximal Outflow: Bilateral common femoral and visualized portions of the superficial and profunda femoral arteries are patent without evidence of aneurysm, dissection, vasculitis or significant stenosis. Veins: Hepatic, portal, superior mesenteric veins are patent. Review of the MIP images confirms the above findings. NON-VASCULAR Lower chest: Trace left and small right pleural effusion. Previously seen pleural implant of the posterior right pleural space is slightly larger on today's examination measuring approximately 2.5 x 1.2 cm compared to 2.0 x 0.9 cm on the prior  study. Additional soft tissue implant in the right paraspinal region, best seen on image 4 of series 11 is similar in size to slightly smaller since prior examination. Hepatobiliary: 1.6 cm simple cyst present in the left hepatic lobe. Liver, gallbladder common bile duct otherwise normal. The Pancreas: Unremarkable. No pancreatic ductal dilatation or surrounding inflammatory changes. Spleen: Normal in size without focal abnormality. Adrenals/Urinary Tract: Adrenal glands are normal. 3.5 cm simple cyst present in the lower pole of the right kidney. 1.3 cm simple cyst in the upper pole of the left kidney. Additional subcentimeter renal hypodensities are too small to fully  characterize. Kidneys, adrenal glands, the adrenal glands, and bladder otherwise unremarkable. Stomach/Bowel: No significant abnormality of the stomach. Small duodenal diverticulum. There is mild thickening of the distal appendix best seen on image 57 of series 7 with the diameter measuring up to 7 mm. More distally within the appendix, best seen on image 53 of series 7, there are small foci of air which appears to be within the wall of the appendix or just adjacent to it. Extensive diverticulosis of the descending and sigmoid colon without evidence of acute diverticulitis. No dilated bowel loops to indicate ileus or obstruction. 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 No enlarged mesenteric, retroperitoneal, or pelvic lymph. Reproductive: Uterus and bilateral adnexa are unremarkable. Other: No abdominal wall hernia or abnormality. No abdominopelvic ascites. Musculoskeletal: Dextroconvex scoliosis of the thoracolumbar spine with multilevel degenerative changes seen throughout the lumbar segments. IMPRESSION: VASCULAR 1. 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 2. No significant stenosis of the superior mesenteric artery. Common hepatic artery is replaced, originating from the superior mesenteric  artery. 3. 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 4. Abnormal tangle of arteries communicating between the common hepatic and splenic artery, traversing the pancreatic body. 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 5. Moderate to severe stenosis at the origin of the right renal artery. 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 NON-VASCULAR 1. Borderline dilatation of the distal appendix with a small focus of air which appears to be within the wall of the appendix or just adjacent to it. These findings are suspicious for early appendicitis. 2. Extensive distal colonic diverticulosis without definitive evidence of acute diverticulitis. 3. Unchanged large presacral mass. 4. Interval development of small right pleural effusion with interval enlargement of posterior pleural implant. Electronically Signed   By: Miachel Roux M.D.   On: 06/19/2022 16:41   DG Chest Portable 1 View  Result Date: 06/19/2022 CLINICAL DATA:  Atrial fibrillation EXAM: PORTABLE CHEST 1 VIEW COMPARISON:  05/19/2022 FINDINGS: Bilateral mild interstitial thickening. Trace bilateral pleural effusions. No pneumothorax. Stable cardiomegaly. No acute osseous abnormality. Right-sided Port-A-Cath in satisfactory position. Thoracic aortic atherosclerosis. Total reverse right shoulder arthroplasty. No acute osseous abnormality. IMPRESSION: 1. Mild pulmonary vascular congestion. Electronically Signed   By: Kathreen Devoid M.D.   On: 06/19/2022 15:25    Cardiac Studies   Echo 03/2022 1. Left ventricular ejection fraction, by estimation, is 45 to 50%. Left  ventricular ejection fraction by 2D MOD biplane is 48.0 %. The left  ventricle has mildly decreased function. The left ventricle demonstrates  global hypokinesis. Left ventricular  diastolic function could not be evaluated.   2. Right ventricular systolic function is mildly  reduced. The right  ventricular size is normal. There is mildly elevated pulmonary artery  systolic pressure. The estimated right ventricular systolic pressure is  26.8 mmHg.   3. The mitral valve is abnormal. Mild to moderate mitral valve  regurgitation.   4. The aortic valve is tricuspid. Aortic valve regurgitation is not  visualized. Aortic valve sclerosis is present, with no evidence of aortic  valve stenosis.   5. The inferior vena cava is dilated in size with <50% respiratory  variability, suggesting right atrial pressure of 15 mmHg.   Comparison(s): Changes from prior study are noted. 10/27/2021: LVEF 60-65%.   Patient Profile     76 y.o.  female with history of paroxysmal atrial fibrillation, not on chronic anticoagulation due to thrombocytopenia and prior bleeding with anemia, CKD, COPD, hyperlipidemia, CML and rheumatoid arthritis presented for evaluation of worsening abdominal pain.  Cardiology is asked for evaluation of the patient relation with rapid ventricular rate.  Assessment & Plan    1.  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 I discussed this with the family. We will see how the transfusion helps her rates - no drug-drug interaction with current chemo regimen and cardiac med including amiodarone if needed -Longstanding history with limited option and long admission 03/2022.  Unable to place on antiarrhythmic drug due unable to anticoagulate or unable to DCCV  2.  Acute on chronic HFpEF -Echo 03/2022 with LV function of 45 to 50% (was  previously 60 to 65%).  Felt likely tachycardia mediated -CXR with mild pulmonary vascular congestion -Mild volume overload on exam -s/p IV lasix briefly - she is euvolemic on the hypovolemic side - ok with IV fluid hydration  3.  Anemia -Management per primary team  4.  Abdominal pain -Surgery team signed off      For questions or updates, please contact [place] Please consult [email or web address] for contact info under        Signed, Janina Mayo, MD  06/21/2022, 10:05 AM

## 2022-06-21 NOTE — Plan of Care (Signed)
  Problem: Clinical Measurements: Goal: Respiratory complications will improve Outcome: Progressing   Problem: Activity: Goal: Risk for activity intolerance will decrease Outcome: Progressing   

## 2022-06-21 NOTE — Progress Notes (Signed)
PROGRESS NOTE  [name]  PQZ:[id number] DOB: 1946/03/08 DOA: 06/19/2022 PCP: [name], MD   Brief Narrative:  Patient is a 76 year old female with history of paroxysmal A-fib not on anticoagulation due to chronic thrombo-cytopenia, COPD, hyperlipidemia, CMML, rheumatoid arthritis on prednisone who presented with severe abdominal pain, diarrhea.  On presentation she was in A-fib with RVR and was started on Cardizem drip.  CT abdomen/pelvis showed possible early acute appendicitis was ruled out by general surgery later.  Cardiology following.  Hospital course remarkable for severe anemia requiring blood transfusion, hematology also following.   Assessment & Plan:  Principal Problem:   Atrial fibrillation with RVR (HCC) Active Problems:   Pancytopenia (HCC)   Malnutrition of moderate degree   Chronic systolic CHF (congestive heart failure) (HCC)   Stage 2 moderate COPD by GOLD classification (HCC)   CMML (chronic myelomonocytic leukemia) (HCC)   Thrombocytopenia (HCC)   Anemia of chronic disease   Bilateral pleural effusion   Constipation   Rheumatoid arthritis (HCC)   Acute urinary retention   Arterial vascular disease  A-fib with RVR: History of paroxysmal A-fib but not on anticoagulation due to thrombocytopenia, anemia from CML.  Started on Cardizem drip, now on oral Cardizem and heart rate is better.  Echo in April 2023 showed EF of 50%.  Also on Toprol.  Currently candidate for DCCV or antiarrhythmic drug.  Acute on chronic systolic congestive heart failure: Echo on 4/23 showed EF of 45 to 50%.  Chest x-ray showed mild pulmonary vascular congestion.  Was given IV Lasix.  Currently she is almost euvolemic.  Has fine bibasilar crackles  CMML/thrombocytopenia/anemia: Follows with oncology, receiving chemotherapy with azacitidine.  Status post 4 cycles.  Oncology recommended to follow-up as an outpatient for continued chemotherapy.  Hemoglobin dropped to the range of 6  today, transfused with 2 unit of PRBC.  Monitor CBC  Abdominal pain/suspicion for already had acute appendicitis: General surgery was also consulted, no further work-up initiated.  Abdomen symptoms have resolved.  History of COPD: Not in exacerbation.  Continue bronchodilators as needed  Constipation: Continue bowel regimen  Rheumatoid Arthritis: On outpatient Remicade.  On prednisone 5 mg daily  Chronic bilateral pleural effusion: Currently on room air.  No respiratory symptoms.  Debility/deconditioning:PT /OT saw the patient and recommended home health on discharge     Nutrition Problem: Moderate Malnutrition Etiology: chronic illness (COPD, CHF)    DVT prophylaxis:SCDs Start: 06/19/22 2301     Code Status: Full Code  Family Communication: Husband at bedside on 6/28  Patient status:Inpatient  Patient is from :Home  Anticipated discharge FH:[facility]  Estimated DC date:Not sure at the moment   Consultants: Cardiology, oncology  Procedures: None  Antimicrobials:  Anti-infectives (From admission, onward)    Start     Dose/Rate Route Frequency Ordered Stop   06/21/22 1230  fosfomycin (MONUROL) packet 3 g        3 g Oral  Once 06/21/22 1131 06/21/22 1213       Subjective: Patient seen and examined at the bedside this morning.  Hemodynamically stable but EKG monitor shows A-fib rhythm in the range of 120s.  Not in any Distress.  Comfortable, on room air.  Complains of some abdominal cramps.  Any shortness of breath or chest pain.  Objective: Vitals:   06/21/22 1202 06/21/22 1257 06/21/22 1313 06/21/22 1343  BP: 118/65 107/69 115/76 (!) 119/98  Pulse: 99 (!) 105 (!) 109 (!) 102  Resp: 18 18 18 18   Temp:  97.7 F (36.5 C) 98.4 F (36.9 C) 98.2 F (36.8 C) (!) 97.5 F (36.4 C)  TempSrc: Oral Oral Oral Oral  SpO2: 96% 95% 98% 98%  Weight:      Height:        Intake/Output Summary (Last 24 hours) at 06/21/2022 1349 Last data filed at 06/21/2022 1330 Gross per  24 hour  Intake 1943.26 ml  Output 1200 ml  Net 743.26 ml   Filed Weights   06/19/22 1621  Weight: 52.6 kg    Examination:  General exam: Overall comfortable, not in distress HEENT: PERRL Respiratory system:  no wheezes or crackles  Cardiovascular system: Irregularly irregular rhythm, Chemo-Port on the right chest Gastrointestinal system: Abdomen is nondistended, soft and nontender. Central nervous system: Alert and oriented Extremities: No edema, no clubbing ,no cyanosis Skin: No rashes, no ulcers,no icterus     Data Reviewed: I have personally reviewed following labs and imaging studies  CBC: Recent Labs  Lab 06/19/22 1221 06/20/22 0125 06/21/22 0305  WBC 14.8* 9.7 7.2  NEUTROABS 10.3*  --   --   HGB 9.3* 7.8* 6.5*  HCT 29.4* 23.3* 20.3*  MCV 108.1* 109.4* 111.5*  PLT 63* 36* 26*   Basic Metabolic Panel: Recent Labs  Lab 06/19/22 1221 06/19/22 1510 06/20/22 0125 06/20/22 0342 06/21/22 0305  NA 136  --  137  --  134*  K 4.0  --  3.0*  --  3.6  CL 99  --  104  --  103  CO2 27  --  27  --  25  GLUCOSE 148*  --  92  --  110*  BUN 31*  --  20  --  11  CREATININE 1.05*  --  0.80  --  0.70  CALCIUM 8.8*  --  7.6*  --  7.1*  MG  --  2.1  --  2.1 1.8  PHOS 3.1  --   --   --   --      Recent Results (from the past 240 hour(s))  SARS Coronavirus 2 by RT PCR (hospital order, performed in [facility] hospital lab) *cepheid single result test* Urine, Catheterized     Status: None   Collection Time: 06/19/22 11:30 PM   Specimen: Urine, Catheterized; Nasal Swab  Result Value Ref Range Status   SARS Coronavirus 2 by RT PCR NEGATIVE NEGATIVE Final    Comment: (NOTE) SARS-CoV-2 target nucleic acids are NOT DETECTED.  The SARS-CoV-2 RNA is generally detectable in upper and lower respiratory specimens during the acute phase of infection. The lowest concentration of SARS-CoV-2 viral copies this assay can detect is 250 copies / mL. A negative result does not preclude  SARS-CoV-2 infection and should not be used as the sole basis for treatment or other patient management decisions.  A negative result may occur with improper specimen collection / handling, submission of specimen other than nasopharyngeal swab, presence of viral mutation(s) within the areas targeted by this assay, and inadequate number of viral copies (<250 copies / mL). A negative result must be combined with clinical observations, patient history, and epidemiological information.  Fact Sheet for Patients:   https://www.patel.info/  Fact Sheet for Healthcare Providers: https://hall.com/  This test is not yet approved or  cleared by the [place] FDA and has been authorized for detection and/or diagnosis of SARS-CoV-2 by FDA under an Emergency Use Authorization (EUA).  This EUA will remain in effect (meaning this test can be used) for the duration of  the COVID-19 declaration under Section 564(b)(1) of the Act, 21 U.S.C. section 360bbb-3(b)(1), unless the authorization is terminated or revoked sooner.  Performed at [facility], [place] [address], [place], [place] [address]   Urine Culture     Status: Abnormal   Collection Time: 06/19/22 11:45 PM   Specimen: In/Out Cath Urine  Result Value Ref Range Status   Specimen Description IN/OUT CATH URINE  Final   Special Requests   Final    NONE Performed at [facility], [place] [address] [place],  [address]    Culture >=100,000 COLONIES/mL ENTEROCOCCUS FAECALIS (A)  Final   Report Status 06/21/2022 FINAL  Final   Organism ID, Bacteria ENTEROCOCCUS FAECALIS (A)  Final      Susceptibility   Enterococcus faecalis - MIC*    AMPICILLIN <=2 SENSITIVE Sensitive     NITROFURANTOIN <=16 SENSITIVE Sensitive     VANCOMYCIN 1 SENSITIVE Sensitive     * >=100,000 COLONIES/mL ENTEROCOCCUS FAECALIS     Radiology Studies: CT Angio Abd/Pel W and/or Wo Contrast  Result Date:  06/19/2022 CLINICAL DATA:  Mesenteric ischemia Severe abdominal pain and cramping Diarrhea and constipation Leukemia EXAM: CTA ABDOMEN AND PELVIS WITHOUT AND WITH CONTRAST TECHNIQUE: Multidetector CT imaging of the abdomen and pelvis was performed using the standard protocol during bolus administration of intravenous contrast. Multiplanar reconstructed images and MIPs were obtained and reviewed to evaluate the vascular anatomy. RADIATION DOSE REDUCTION: This exam was performed according to the departmental dose-optimization program which includes automated exposure control, adjustment of the mA and/or kV according to patient size and/or use of iterative reconstruction technique. CONTRAST:  75 mL OMNIPAQUE IOHEXOL 350 MG/ML SOLN COMPARISON:  CT chest, abdomen, and pelvis 12/23/2021 FINDINGS: VASCULAR Aorta: Diffuse atherosclerotic calcified plaque without aneurysm or flow-limiting stenosis. 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 SMA: No significant stenosis of the superior mesenteric artery. The hepatic artery is replaced radiating from the superior mesenteric artery. There are multiple irregular branches in the region of the pancreatic body which appear to be communicating between the common hepatic and splenic arteries. A small aneurysm originating from 1 of these branches measures approximately 4 mm and is located within the pancreatic body, best seen on image 102 of series 8 and image 38 of series 4. Renals: All narrowing at the origin of the left main renal artery. Moderate to severe stenosis of the origin of the right main renal artery. 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 IMA: Moderate stenosis of the origin of the inferior mesenteric artery. Inflow: Patent without evidence of  aneurysm, dissection, vasculitis or significant stenosis. Proximal Outflow: Bilateral common femoral and visualized portions of the superficial and profunda femoral arteries are patent without evidence of aneurysm, dissection, vasculitis or significant stenosis. Veins: Hepatic, portal, superior mesenteric veins are patent. Review of the MIP images confirms the above findings. NON-VASCULAR Lower chest: Trace left and small right pleural effusion. Previously seen pleural implant of the posterior right pleural space is slightly larger on today's examination measuring approximately 2.5 x 1.2 cm compared to 2.0 x 0.9 cm on the prior study. Additional soft tissue implant in the right paraspinal region, best seen on image 4 of series 11 is similar in size to slightly smaller since prior examination. Hepatobiliary: 1.6 cm  simple cyst present in the left hepatic lobe. Liver, gallbladder common bile duct otherwise normal. The Pancreas: Unremarkable. No pancreatic ductal dilatation or surrounding inflammatory changes. Spleen: Normal in size without focal abnormality. Adrenals/Urinary Tract: Adrenal glands are normal. 3.5 cm simple cyst present in the lower pole of the right kidney. 1.3 cm simple cyst in the upper pole of the left kidney. Additional subcentimeter renal hypodensities are too small to fully characterize. Kidneys, adrenal glands, the adrenal glands, and bladder otherwise unremarkable. Stomach/Bowel: No significant abnormality of the stomach. Small duodenal diverticulum. There is mild thickening of the distal appendix best seen on image 57 of series 7 with the diameter measuring up to 7 mm. More distally within the appendix, best seen on image 53 of series 7, there are small foci of air which appears to be within the wall of the appendix or just adjacent to it. Extensive diverticulosis of the descending and sigmoid colon without evidence of acute diverticulitis. No dilated bowel loops to indicate ileus or  obstruction. 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 No enlarged mesenteric, retroperitoneal, or pelvic lymph. Reproductive: Uterus and bilateral adnexa are unremarkable. Other: No abdominal wall hernia or abnormality. No abdominopelvic ascites. Musculoskeletal: Dextroconvex scoliosis of the thoracolumbar spine with multilevel degenerative changes seen throughout the lumbar segments. IMPRESSION: VASCULAR 1. 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 2. No significant stenosis of the superior mesenteric artery. Common hepatic artery is replaced, originating from the superior mesenteric artery. 3. 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 4. Abnormal tangle of arteries communicating between the common hepatic and splenic artery, traversing the pancreatic body. 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 5. Moderate to severe stenosis at the origin of the right renal artery. 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 NON-VASCULAR 1. Borderline dilatation of the distal appendix with a small focus of air which appears to be within the wall of the appendix or just adjacent to it. These findings are suspicious for early appendicitis. 2. Extensive distal colonic diverticulosis without definitive evidence of acute diverticulitis. 3. Unchanged large presacral mass. 4. Interval development of small right pleural effusion with interval enlargement of posterior pleural implant. Electronically Signed   By: Miachel Roux M.D.   On: 06/19/2022 16:41   DG Chest Portable 1 View  Result Date: 06/19/2022 CLINICAL DATA:  Atrial fibrillation EXAM: PORTABLE CHEST 1 VIEW COMPARISON:  05/19/2022 FINDINGS: Bilateral mild interstitial thickening. Trace bilateral pleural effusions. No pneumothorax. Stable cardiomegaly. No acute osseous abnormality. Right-sided Port-A-Cath in  satisfactory position. Thoracic aortic atherosclerosis. Total reverse right shoulder arthroplasty. No acute osseous abnormality. IMPRESSION: 1. Mild pulmonary vascular congestion. Electronically Signed   By: Kathreen Devoid M.D.   On: 06/19/2022 15:25    Scheduled Meds:  Chlorhexidine Gluconate Cloth  6 each Topical Daily   diclofenac Sodium  2 g Topical QID   diltiazem  60 mg Oral Q8H   feeding supplement  237 mL Oral Q24H   gabapentin  300 mg Oral q AM   insulin aspart  0-9 Units Subcutaneous Q4H   metoprolol tartrate  50 mg Oral Q6H   polyethylene glycol  17 g Oral Daily   predniSONE  5 mg Oral Q breakfast   senna  1 tablet Oral BID   sodium chloride flush  10-40 mL Intracatheter Q12H   tamsulosin  0.4 mg Oral QPC supper   Continuous Infusions:  diltiazem (CARDIZEM) infusion Stopped (06/20/22 1409)     LOS: 2 days   Shelly Coss, MD [facility] P6/28/2023, 1:49 PM

## 2022-06-21 NOTE — Progress Notes (Signed)
Physical Therapy Treatment Patient Details Name: [name] MRN: [id number] DOB: 1946/04/03 Today's Date: 06/21/2022   History of Present Illness Pt is a 76 y.o. female admitted 06/19/22 due to worsening abdominal pain. Workup for afib with RVR. Per general surgery consult, abdominal tenderness secondary to urinary retention; not consistent with appendicitis. 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    PT Comments    Pt received supine and agreeable to session with encouragement needed for OOB mobility. Pt agreeable to standing therex for increased strength, ROM and endurance with good tolerance with no LOB. Pt able to demonstrate ambulation and transfers with min guard for safety with RW. Encouraged pt to continue OOB mobility throughout day as tolerated with pt verbalizing understanding of benefits. Pt continues to benefit from skilled PT services to progress toward functional mobility goals.   SpO2 >96% on RA  HR briefly 135-149 afib with activity   Recommendations for follow up therapy are one component of a multi-disciplinary discharge planning process, led by the attending physician.  Recommendations may be updated based on patient status, additional functional criteria and insurance authorization.  Follow Up Recommendations  Home health PT     Assistance Recommended at Discharge Intermittent Supervision/Assistance  Patient can return home with the following A little help with bathing/dressing/bathroom;Assistance with cooking/housework;Assist for transportation;Help with stairs or ramp for entrance   Equipment Recommendations  None recommended by PT    Recommendations for Other Services       Precautions / Restrictions Precautions Precautions: Fall;Other (comment) Precaution Comments: watch HR Restrictions Weight Bearing Restrictions: No     Mobility  Bed Mobility Overal bed mobility: Modified Independent              General bed mobility comments: HOB elevated    Transfers Overall transfer level: Needs assistance Equipment used: Rolling walker (2 wheels), None Transfers: Sit to/from Stand Sit to Stand: Min guard, Min assist           General transfer comment: min assist to steady when standing without AD, min guard from [facility] with RW    Ambulation/Gait Ambulation/Gait assistance: Min guard Gait Distance (Feet): 30 Feet Assistive device: Rolling walker (2 wheels) Gait Pattern/deviations: Step-through pattern, Decreased stride length, Trunk flexed Gait velocity: Decreased     General Gait Details: slow, fatigued gait with RW and intermittent min guard for balance;   Stairs             Wheelchair Mobility    Modified Rankin (Stroke Patients Only)       Balance Overall balance assessment: Needs assistance   Sitting balance-Leahy Scale: Good     Standing balance support: No upper extremity supported, Bilateral upper extremity supported Standing balance-Leahy Scale: Fair Standing balance comment: Pt was able to stadn with no BUE support but increase in back pain when not using a walker to support                            Cognition Arousal/Alertness: Awake/alert Behavior During Therapy: WFL for tasks assessed/performed, Flat affect Overall Cognitive Status: Within Functional Limits for tasks assessed                                 General Comments: WFL for simple tasks, not formally assessed        Exercises Other Exercises Other  Exercises: STANDING: marching x20 reps x2 sets, mini sqauts x10, hip abduction x10 ea side, heel raises x15, hip extension x10 ea side, modified push ups x10, cervical flexion/extension x10, rotation R/L x 10, diagonal up/down x10 ea side    General Comments General comments (skin integrity, edema, etc.): SpO2 >96% on RA; HR briefly to 135 bom withstanding exercise. Pt's husband present and supportive       Pertinent Vitals/Pain Pain Assessment Pain Assessment: Faces Faces Pain Scale: Hurts little more Pain Location: lower abdomen, bilateral knees (R>L) Pain Descriptors / Indicators: Discomfort, Sore Pain Intervention(s): Monitored during session, Limited activity within patient's tolerance    Home Living                          Prior Function            PT Goals (current goals can now be found in the care plan section) Acute Rehab PT Goals Patient Stated Goal: "get solutions for what's going on" PT Goal Formulation: With patient Time For Goal Achievement: 07/04/22    Frequency    Min 3X/week      PT Plan      Co-evaluation              AM-PAC PT "6 Clicks" Mobility   Outcome Measure  Help needed turning from your back to your side while in a flat bed without using bedrails?: None Help needed moving from lying on your back to sitting on the side of a flat bed without using bedrails?: A Little Help needed moving to and from a bed to a chair (including a wheelchair)?: A Little Help needed standing up from a chair using your arms (e.g., wheelchair or bedside chair)?: A Little Help needed to walk in hospital room?: A Little Help needed climbing 3-5 steps with a railing? : A Little 6 Click Score: 19    End of Session   Activity Tolerance: Patient tolerated treatment well Patient left: with call bell/phone within reach;with family/visitor present;Other (comment);in bed (with MD present) Nurse Communication: Mobility status PT Visit Diagnosis: Other abnormalities of gait and mobility (R26.89);Muscle weakness (generalized) (M62.81)     Time: 7673-4193 PT Time Calculation (min) (ACUTE ONLY): 26 min  Charges:  $Therapeutic Exercise: 8-22 mins $Therapeutic Activity: 8-22 mins                     Edynn Gillock R. PTA Acute Rehabilitation Services Office: [place] 06/21/2022, 10:14 AM

## 2022-06-22 ENCOUNTER — Other Ambulatory Visit: Payer: Self-pay

## 2022-06-22 DIAGNOSIS — I4891 Unspecified atrial fibrillation: Secondary | ICD-10-CM | POA: Diagnosis not present

## 2022-06-22 LAB — TYPE AND SCREEN
ABO/RH(D): B POS
Antibody Screen: NEGATIVE
Unit division: 0
Unit division: 0

## 2022-06-22 LAB — COMPREHENSIVE METABOLIC PANEL
ALT: 19 U/L (ref 0–44)
AST: 18 U/L (ref 15–41)
Albumin: 2.3 g/dL — ABNORMAL LOW (ref 3.5–5.0)
Alkaline Phosphatase: 35 U/L — ABNORMAL LOW (ref 38–126)
Anion gap: 6 (ref 5–15)
BUN: 12 mg/dL (ref 8–23)
CO2: 24 mmol/L (ref 22–32)
Calcium: 7.8 mg/dL — ABNORMAL LOW (ref 8.9–10.3)
Chloride: 106 mmol/L (ref 98–111)
Creatinine, Ser: 0.7 mg/dL (ref 0.44–1.00)
GFR, Estimated: >60 mL/min (ref >60)
Glucose, Bld: 105 mg/dL — ABNORMAL HIGH (ref 70–99)
Potassium: 3.7 mmol/L (ref 3.5–5.1)
Sodium: 136 mmol/L (ref 135–145)
Total Bilirubin: 1.5 mg/dL — ABNORMAL HIGH (ref 0.3–1.2)
Total Protein: 4.3 g/dL — ABNORMAL LOW (ref 6.5–8.1)

## 2022-06-22 LAB — CBC
HCT: 28.3 % — ABNORMAL LOW (ref 36.0–46.0)
Hemoglobin: 9.3 g/dL — ABNORMAL LOW (ref 12.0–15.0)
MCH: 33.6 pg (ref 26.0–34.0)
MCHC: 32.9 g/dL (ref 30.0–36.0)
MCV: 102.2 fL — ABNORMAL HIGH (ref 80.0–100.0)
Platelets: 25 10*3/uL — CL (ref 150–400)
RBC: 2.77 MIL/uL — ABNORMAL LOW (ref 3.87–5.11)
RDW: 22.4 % — ABNORMAL HIGH (ref 11.5–15.5)
WBC: 5.6 10*3/uL (ref 4.0–10.5)
nRBC: 0 % (ref 0.0–0.2)

## 2022-06-22 LAB — BPAM RBC
Blood Product Expiration Date: 202307172359
Blood Product Expiration Date: 202307172359
ISSUE DATE / TIME: 202306281015
ISSUE DATE / TIME: 202306281329
Unit Type and Rh: 7300
Unit Type and Rh: 7300

## 2022-06-22 LAB — HEMOGLOBIN A1C
Hgb A1c MFr Bld: 5.2 % (ref 4.8–5.6)
Mean Plasma Glucose: 103 mg/dL

## 2022-06-22 LAB — GLUCOSE, CAPILLARY
Glucose-Capillary: 101 mg/dL — ABNORMAL HIGH (ref 70–99)
Glucose-Capillary: 107 mg/dL — ABNORMAL HIGH (ref 70–99)
Glucose-Capillary: 112 mg/dL — ABNORMAL HIGH (ref 70–99)
Glucose-Capillary: 124 mg/dL — ABNORMAL HIGH (ref 70–99)
Glucose-Capillary: 159 mg/dL — ABNORMAL HIGH (ref 70–99)

## 2022-06-22 MED ORDER — DILTIAZEM HCL 60 MG PO TABS
60.0000 mg | ORAL_TABLET | Freq: Four times a day (QID) | ORAL | Status: DC
  Administered 2022-06-22 – 2022-06-23 (×4): 60 mg via ORAL
  Filled 2022-06-22 (×4): qty 1

## 2022-06-22 MED ORDER — INSULIN ASPART 100 UNIT/ML IJ SOLN: 0.0000 [IU] | Freq: Three times a day (TID) | INTRAMUSCULAR | Status: DC

## 2022-06-22 NOTE — Care Management Important Message (Signed)
Important Message  Patient Details  Name: [name] MRN: [id number] Date of Birth: 08/24/1946   Medicare Important Message Given:  Yes     Shelda Altes 06/22/2022, 8:27 AM

## 2022-06-22 NOTE — Progress Notes (Signed)
Physical Therapy Treatment Patient Details Name: [name] MRN: [id number] DOB: 18-Jun-1946 Today's Date: 06/22/2022   History of Present Illness Pt is a 76 y.o. female admitted 06/19/22 due to worsening abdominal pain. Workup for afib with RVR. Per general surgery consult, abdominal tenderness secondary to urinary retention; not consistent with appendicitis. 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    PT Comments    Pt received supine and agreeable to session with continued progress towards goals, however pt continues to fatigue quickly. Pt able to demonstrate transfers and ambulation with grossly min guard with RW with light cues for RW proximity. HR stable throughout ambulation, 88 bpm on arrival at rest increasing to 112 bpm during activity . Pt able to static stand for extended time for peri-care at beside without fault. Educated pt re; HEP and compliance, energy conservation, activity recommendations and increasing activity slowly. Pt and spouse verbalizing understanding. Pt requesting HEP, plan to print and bring back to room at later time today. Pt continues to benefit from skilled PT services to progress toward functional mobility goals.    Recommendations for follow up therapy are one component of a multi-disciplinary discharge planning process, led by the attending physician.  Recommendations may be updated based on patient status, additional functional criteria and insurance authorization.  Follow Up Recommendations  Home health PT     Assistance Recommended at Discharge Intermittent Supervision/Assistance  Patient can return home with the following A little help with bathing/dressing/bathroom;Assistance with cooking/housework;Assist for transportation;Help with stairs or ramp for entrance   Equipment Recommendations  None recommended by PT    Recommendations for Other Services       Precautions / Restrictions  Precautions Precautions: Fall;Other (comment) Precaution Comments: watch HR Restrictions Weight Bearing Restrictions: No     Mobility  Bed Mobility Overal bed mobility: Modified Independent             General bed mobility comments: HOB elevated    Transfers Overall transfer level: Needs assistance Equipment used: Rolling walker (2 wheels), None Transfers: Sit to/from Stand Sit to Stand: Min guard           General transfer comment: min guard for safety    Ambulation/Gait Ambulation/Gait assistance: Min guard Gait Distance (Feet): 136 Feet Assistive device: Rolling walker (2 wheels) Gait Pattern/deviations: Step-through pattern, Decreased stride length, Trunk flexed Gait velocity: Decreased     General Gait Details: slow, fatigued gait with RW and intermittent min guard for balance;   Stairs             Wheelchair Mobility    Modified Rankin (Stroke Patients Only)       Balance Overall balance assessment: Needs assistance   Sitting balance-Leahy Scale: Good     Standing balance support: No upper extremity supported, Bilateral upper extremity supported Standing balance-Leahy Scale: Fair Standing balance comment: Pt was able to stadn with no BUE support for peri care                            Cognition Arousal/Alertness: Awake/alert Behavior During Therapy: WFL for tasks assessed/performed, Flat affect Overall Cognitive Status: Within Functional Limits for tasks assessed                                 General Comments: WFL for simple tasks, not formally assessed  Exercises      General Comments General comments (skin integrity, edema, etc.): VSS on RA      Pertinent Vitals/Pain Pain Assessment Pain Assessment: No/denies pain Pain Intervention(s): Monitored during session    Home Living                          Prior Function            PT Goals (current goals can now be found in  the care plan section) Acute Rehab PT Goals PT Goal Formulation: With patient Time For Goal Achievement: 07/04/22    Frequency    Min 3X/week      PT Plan      Co-evaluation              AM-PAC PT "6 Clicks" Mobility   Outcome Measure  Help needed turning from your back to your side while in a flat bed without using bedrails?: None Help needed moving from lying on your back to sitting on the side of a flat bed without using bedrails?: A Little Help needed moving to and from a bed to a chair (including a wheelchair)?: A Little Help needed standing up from a chair using your arms (e.g., wheelchair or bedside chair)?: A Little Help needed to walk in hospital room?: A Little Help needed climbing 3-5 steps with a railing? : A Little 6 Click Score: 19    End of Session Equipment Utilized During Treatment: Gait belt Activity Tolerance: Patient tolerated treatment well Patient left: with call bell/phone within reach;with family/visitor present;in chair Nurse Communication: Mobility status PT Visit Diagnosis: Other abnormalities of gait and mobility (R26.89);Muscle weakness (generalized) (M62.81)     Time: 1655-3748 PT Time Calculation (min) (ACUTE ONLY): 28 min  Charges:  $Gait Training: 8-22 mins $Therapeutic Activity: 8-22 mins                     [name] R. PTA Acute Rehabilitation Services Office: [place] 06/22/2022, 10:40 AM

## 2022-06-22 NOTE — Progress Notes (Signed)
Mobility Specialist: Progress Note   06/22/22 1734  Mobility  Activity Ambulated with assistance in hallway  Level of Assistance Standby assist, set-up cues, supervision of patient - no hands on  Assistive Device Front wheel walker  Distance Ambulated (ft) 180 ft  Activity Response Tolerated well  $Mobility charge 1 Mobility   Pre-Mobility: 109 HR, 95% SpO2 During Mobility: 130s-150s HR Post-Mobility: 117 HR  Pt received in the bed and agreeable to mobility. C/o BUE fatigue during session, otherwise asymptomatic. Pt sitting EOB after session with call bell at her side.   [facility] [name] Mobility Specialist Mobility Specialist [address]: [phone]

## 2022-06-22 NOTE — Progress Notes (Addendum)
Progress Note  Patient Name: [name] Date of Encounter: 06/22/2022  [facility] Cardiologist: Mertie Moores, MD   Subjective   She walked with PT. She did well, has some deconditioning.  Inpatient Medications    Scheduled Meds:  Chlorhexidine Gluconate Cloth  6 each Topical Daily   diclofenac Sodium  2 g Topical QID   diltiazem  60 mg Oral Q8H   feeding supplement  237 mL Oral Q24H   gabapentin  300 mg Oral q AM   insulin aspart  0-9 Units Subcutaneous Q4H   metoprolol tartrate  50 mg Oral Q6H   polyethylene glycol  17 g Oral Daily   predniSONE  5 mg Oral Q breakfast   senna  1 tablet Oral BID   sodium chloride flush  10-40 mL Intracatheter Q12H   tamsulosin  0.4 mg Oral QPC supper   Continuous Infusions:  diltiazem (CARDIZEM) infusion Stopped (06/20/22 1409)   PRN Meds: acetaminophen **OR** acetaminophen, baclofen, dicyclomine, fentaNYL (SUBLIMAZE) injection, guaiFENesin, hydrALAZINE, HYDROcodone-acetaminophen, ipratropium-albuterol, metoprolol tartrate, ondansetron **OR** ondansetron (ZOFRAN) IV, senna-docusate, sodium chloride flush, traZODone   Vital Signs    Vitals:   06/21/22 2301 06/22/22 0515 06/22/22 0538 06/22/22 0837  BP: 115/75 112/78 126/74 115/62  Pulse: 96 100 (!) 104 73  Resp: 17 18  17   Temp: 97.6 F (36.4 C) 97.8 F (36.6 C)  98.1 F (36.7 C)  TempSrc: Oral Oral  Oral  SpO2: 96% 96%  97%  Weight:      Height:        Intake/Output Summary (Last 24 hours) at 06/22/2022 0957 Last data filed at 06/22/2022 0841 Gross per 24 hour  Intake 1498 ml  Output 1325 ml  Net 173 ml      06/19/2022    4:21 PM 06/16/2022   10:51 AM 06/12/2022    9:58 AM  Last 3 Weights  Weight (lbs) 116 lb 119 lb 12.8 oz 121 lb 4.8 oz  Weight (kg) 52.617 kg 54.341 kg 55.021 kg      Telemetry    Atrial fibrillation rate controlled predominantly <120 Personally Reviewed  ECG    Afib rates 120s  Physical Exam   GEN: No acute distress.   Neck: No  JVD Cardiac: Irregularly irregular tachycardic, no murmurs, rubs, or gallops.  Respiratory: Diminished breath sounds. GI: Soft, nontender, non-distended  MS: no significant edema Neuro:  Nonfocal  Psych: Normal affect   Labs    High Sensitivity Troponin:   Recent Labs  Lab 06/19/22 1510 06/19/22 1838 06/20/22 0125 06/20/22 0342  TROPONINIHS 18* 24* 26* 24*     Chemistry Recent Labs  Lab 06/19/22 1510 06/20/22 0125 06/20/22 0342 06/21/22 0305 06/22/22 0425  NA  --  137  --  134* 136  K  --  3.0*  --  3.6 3.7  CL  --  104  --  103 106  CO2  --  27  --  25 24  GLUCOSE  --  92  --  110* 105*  BUN  --  20  --  11 12  CREATININE  --  0.80  --  0.70 0.70  CALCIUM  --  7.6*  --  7.1* 7.8*  MG 2.1  --  2.1 1.8  --   PROT  --  4.3*  --  3.9* 4.3*  ALBUMIN  --  2.5*  --  2.3* 2.3*  AST  --  11*  --  13* 18  ALT  --  16  --  15 19  ALKPHOS  --  34*  --  29* 35*  BILITOT  --  1.2  --  0.9 1.5*  GFRNONAA  --  >60  --  >60 >60  ANIONGAP  --  6  --  6 6     Hematology Recent Labs  Lab 06/20/22 0125 06/21/22 0305 06/22/22 0425  WBC 9.7 7.2 5.6  RBC 2.13* 1.82* 2.77*  HGB 7.8* 6.5* 9.3*  HCT 23.3* 20.3* 28.3*  MCV 109.4* 111.5* 102.2*  MCH 36.6* 35.7* 33.6  MCHC 33.5 32.0 32.9  RDW 19.9* 19.6* 22.4*  PLT 36* 26* 25*    Radiology    No results found.  Cardiac Studies   Echo 03/2022 1. Left ventricular ejection fraction, by estimation, is 45 to 50%. Left  ventricular ejection fraction by 2D MOD biplane is 48.0 %. The left  ventricle has mildly decreased function. The left ventricle demonstrates  global hypokinesis. Left ventricular  diastolic function could not be evaluated.   2. Right ventricular systolic function is mildly reduced. The right  ventricular size is normal. There is mildly elevated pulmonary artery  systolic pressure. The estimated right ventricular systolic pressure is  78.6 mmHg.   3. The mitral valve is abnormal. Mild to moderate mitral  valve  regurgitation.   4. The aortic valve is tricuspid. Aortic valve regurgitation is not  visualized. Aortic valve sclerosis is present, with no evidence of aortic  valve stenosis.   5. The inferior vena cava is dilated in size with <50% respiratory  variability, suggesting right atrial pressure of 15 mmHg.   Comparison(s): Changes from prior study are noted. 10/27/2021: LVEF 60-65%.   Patient Profile     76 y.o. female with history of paroxysmal atrial fibrillation, not on chronic anticoagulation due to thrombocytopenia and prior bleeding with anemia, CKD, COPD, hyperlipidemia, CML and rheumatoid arthritis presented for evaluation of worsening abdominal pain.  Cardiology is asked for evaluation of the patient relation with rapid ventricular rate.  Assessment & Plan    1.  Persistent Atrial fibrillation with rapid regular rate; persistent - not on AC 2/2 thrombocytopenia in the setting of CMML on Azacitidine - PRBCs helped to improve rates. Goal Hgb at least >7 - will continue metop 50 mg q6H ( holding home XL 150 mg BID) to determine if we can get better rate control, increased diltiazem 60 mg  to q6H;  Can change to metoprolol 150 mg tartrate BID tomorrow, Diltiazem 240 mg daily.  - she is planned for PRBCs with anemia this can help her rates - would like to avoid amiodarone with risk of chemical coonversion and stroke. I discussed this with the family.  - no drug-drug interaction with current chemo regimen and cardiac med including amiodarone if needed -Longstanding history with limited options and long admission 03/2022.  Unable to place on antiarrhythmic drug due unable to anticoagulate or unable to DCCV  2.  Acute on chronic HFpEF -Echo 03/2022 with LV function of 45 to 50% (was previously 60 to 65%).  Felt likely tachycardia mediated -CXR with mild pulmonary vascular congestion -Mild volume overload on exam -s/p IV lasix briefly - she is euvolemic on the hypovolemic side - ok  with IV fluid hydration  3.  CMML/Thrombocytopenia/Anemia - ongoing cytotoxic chemo -s/p PRBCs and platelets  4.  Abdominal pain -Surgery team signed off   She is safe for discharge from a cardiac standpoint tomorrow.  For questions or updates, please contact  [facility] Please consult [email or web address] for contact info under        Signed, Janina Mayo, MD  06/22/2022, 9:57 AM

## 2022-06-22 NOTE — Progress Notes (Addendum)
PROGRESS NOTE  [name]  ZOX:[id number] DOB: [birth year] DOA: 06/19/2022 PCP: [name], MD   Brief Narrative:  Patient is a 76 year old female with history of paroxysmal A-fib not on anticoagulation due to chronic thrombo-cytopenia, COPD, hyperlipidemia, CMML, rheumatoid arthritis on prednisone who presented with severe abdominal pain, diarrhea.  On presentation she was in A-fib with RVR and was started on Cardizem drip.  CT abdomen/pelvis showed possible early acute appendicitis was ruled out by general surgery later.  Cardiology following.  Hospital course remarkable for severe anemia requiring blood transfusion, hematology also following.  Plan for discharge tomorrow if remains hemodynamically stable.   Assessment & Plan:  Principal Problem:   Atrial fibrillation with RVR (HCC) Active Problems:   Pancytopenia (HCC)   Malnutrition of moderate degree   Chronic systolic CHF (congestive heart failure) (HCC)   Stage 2 moderate COPD by GOLD classification (HCC)   CMML (chronic myelomonocytic leukemia) (HCC)   Thrombocytopenia (HCC)   Anemia of chronic disease   Bilateral pleural effusion   Constipation   Rheumatoid arthritis (HCC)   Acute urinary retention   Arterial vascular disease  A-fib with RVR: History of paroxysmal A-fib but not on anticoagulation due to thrombocytopenia, anemia from CML.  Started on Cardizem drip, now on oral Cardizem and heart rate is better.  Echo in April 2023 showed EF of 50%.  Also on metoprolol.  Currently not a candidate for DCCV or antiarrhythmic drug.  Acute on chronic systolic congestive heart failure: Echo on 4/23 showed EF of 45 to 50%.  Chest x-ray showed mild pulmonary vascular congestion.  Was given IV Lasix.  Currently she is almost euvolemic.    CMML/thrombocytopenia/anemia: Follows with oncology, receiving chemotherapy with azacitidine.  Status post 4 cycles.  Oncology recommended to follow-up as an outpatient for continued  chemotherapy.  Hemoglobin dropped to the range of 6, she was transfused with 2 unit of PRBC.  Hemoglobin stable in the range of 9 today, platelets in the range of 25,000.  Check CBC tomorrow  Abdominal pain/suspicion for already had acute appendicitis: General surgery was also consulted, no further work-up initiated.  Abdomen symptoms have resolved.  History of COPD: Not in exacerbation.  Continue bronchodilators as needed  Constipation: Continue bowel regimen  Rheumatoid Arthritis: On outpatient Remicade.  On prednisone 5 mg daily  Chronic bilateral pleural effusion: Currently on room air.  No respiratory symptoms.  Enterococcus UTI: Denies any dysuria.  Treated with a dose of fosfomycin.  Afebrile, no leukocytosis  Debility/deconditioning:PT /OT saw the patient and recommended home health on discharge     Nutrition Problem: Moderate Malnutrition Etiology: chronic illness (COPD, CHF)    DVT prophylaxis:SCDs Start: 06/19/22 2301     Code Status: Full Code  Family Communication: Husband at bedside on 6/29  Patient status:Inpatient  Patient is from :Home  Anticipated discharge WJ:XBJY  Estimated DC date: Tomorrow   Consultants: Cardiology, oncology  Procedures: None  Antimicrobials:  Anti-infectives (From admission, onward)    Start     Dose/Rate Route Frequency Ordered Stop   06/21/22 1230  fosfomycin (MONUROL) packet 3 g        3 g Oral  Once 06/21/22 1131 06/21/22 1213       Subjective: Patient seen and examined the bedside this morning.  Hemodynamically stable.  Heart rate is well controlled today in the range of 80s, still in A-fib.  Very eager to go home.  We discussed about monitoring her 1 more night before discharge  Objective:  Vitals:   06/22/22 0515 06/22/22 0538 06/22/22 0837 06/22/22 1144  BP: 112/78 126/74 115/62 139/87  Pulse: 100 (!) 104 73 90  Resp: 18  17 17   Temp: 97.8 F (36.6 C)  98.1 F (36.7 C) (!) 97.4 F (36.3 C)  TempSrc: Oral   Oral Oral  SpO2: 96%  97% 100%  Weight:      Height:        Intake/Output Summary (Last 24 hours) at 06/22/2022 1153 Last data filed at 06/22/2022 0841 Gross per 24 hour  Intake 1183 ml  Output 1325 ml  Net -142 ml   Filed Weights   06/19/22 1621  Weight: 52.6 kg    Examination:   General exam: Overall comfortable, not in distress, pleasant female HEENT: PERRL Respiratory system:  no wheezes or crackles  Cardiovascular system: Irregularly irregular rhythm, Chemo-Port on the right chest Gastrointestinal system: Abdomen is nondistended, soft and nontender. Central nervous system: Alert and oriented Extremities: No edema, no clubbing ,no cyanosis Skin: No rashes, no ulcers,no icterus     Data Reviewed: I have personally reviewed following labs and imaging studies  CBC: Recent Labs  Lab 06/19/22 1221 06/20/22 0125 06/21/22 0305 06/22/22 0425  WBC 14.8* 9.7 7.2 5.6  NEUTROABS 10.3*  --   --   --   HGB 9.3* 7.8* 6.5* 9.3*  HCT 29.4* 23.3* 20.3* 28.3*  MCV 108.1* 109.4* 111.5* 102.2*  PLT 63* 36* 26* 25*   Basic Metabolic Panel: Recent Labs  Lab 06/19/22 1221 06/19/22 1510 06/20/22 0125 06/20/22 0342 06/21/22 0305 06/22/22 0425  NA 136  --  137  --  134* 136  K 4.0  --  3.0*  --  3.6 3.7  CL 99  --  104  --  103 106  CO2 27  --  27  --  25 24  GLUCOSE 148*  --  92  --  110* 105*  BUN 31*  --  20  --  11 12  CREATININE 1.05*  --  0.80  --  0.70 0.70  CALCIUM 8.8*  --  7.6*  --  7.1* 7.8*  MG  --  2.1  --  2.1 1.8  --   PHOS 3.1  --   --   --   --   --      Recent Results (from the past 240 hour(s))  SARS Coronavirus 2 by RT PCR (hospital order, performed in [facility] hospital lab) *cepheid single result test* Urine, Catheterized     Status: None   Collection Time: 06/19/22 11:30 PM   Specimen: Urine, Catheterized; Nasal Swab  Result Value Ref Range Status   SARS Coronavirus 2 by RT PCR NEGATIVE NEGATIVE Final    Comment: (NOTE) SARS-CoV-2 target  nucleic acids are NOT DETECTED.  The SARS-CoV-2 RNA is generally detectable in upper and lower respiratory specimens during the acute phase of infection. The lowest concentration of SARS-CoV-2 viral copies this assay can detect is 250 copies / mL. A negative result does not preclude SARS-CoV-2 infection and should not be used as the sole basis for treatment or other patient management decisions.  A negative result may occur with improper specimen collection / handling, submission of specimen other than nasopharyngeal swab, presence of viral mutation(s) within the areas targeted by this assay, and inadequate number of viral copies (<250 copies / mL). A negative result must be combined with clinical observations, patient history, and epidemiological information.  Fact Sheet for Patients:  https://www.patel.info/  Fact Sheet for Healthcare Providers: https://hall.com/  This test is not yet approved or  cleared by the [place] FDA and has been authorized for detection and/or diagnosis of SARS-CoV-2 by FDA under an Emergency Use Authorization (EUA).  This EUA will remain in effect (meaning this test can be used) for the duration of the COVID-19 declaration under Section 564(b)(1) of the Act, 21 U.S.C. section 360bbb-3(b)(1), unless the authorization is terminated or revoked sooner.  Performed at [facility], [place] [address], [place], [place] [address]   Urine Culture     Status: Abnormal   Collection Time: 06/19/22 11:45 PM   Specimen: In/Out Cath Urine  Result Value Ref Range Status   Specimen Description IN/OUT CATH URINE  Final   Special Requests   Final    NONE Performed at [facility], [place] [address], [place], [place] [address]    Culture >=100,000 COLONIES/mL ENTEROCOCCUS FAECALIS (A)  Final   Report Status 06/21/2022 FINAL  Final   Organism ID, Bacteria ENTEROCOCCUS FAECALIS (A)  Final      Susceptibility    Enterococcus faecalis - MIC*    AMPICILLIN <=2 SENSITIVE Sensitive     NITROFURANTOIN <=16 SENSITIVE Sensitive     VANCOMYCIN 1 SENSITIVE Sensitive     * >=100,000 COLONIES/mL ENTEROCOCCUS FAECALIS     Radiology Studies: No results found.  Scheduled Meds:  Chlorhexidine Gluconate Cloth  6 each Topical Daily   diclofenac Sodium  2 g Topical QID   diltiazem  60 mg Oral Q6H   feeding supplement  237 mL Oral Q24H   gabapentin  300 mg Oral q AM   insulin aspart  0-9 Units Subcutaneous Q4H   metoprolol tartrate  50 mg Oral Q6H   polyethylene glycol  17 g Oral Daily   predniSONE  5 mg Oral Q breakfast   senna  1 tablet Oral BID   sodium chloride flush  10-40 mL Intracatheter Q12H   tamsulosin  0.4 mg Oral QPC supper   Continuous Infusions:     LOS: 3 days   Shelly Coss, MD [facility] P6/29/2023, 11:53 AM

## 2022-06-22 NOTE — Progress Notes (Signed)
Notified by CCMD that patient's HR went up to 150s.   Assessed patient. No acute distress. Denies pain except cramping that has been ongoing.  EKG done; Afib RVR. Notified on call Cardiology. Advised by MD to monitor patient. Scheduled metoprolol and cardizem given @[facility] .   Will continue to monitor

## 2022-06-23 DIAGNOSIS — I4891 Unspecified atrial fibrillation: Secondary | ICD-10-CM | POA: Diagnosis not present

## 2022-06-23 LAB — CBC
HCT: 29.6 % — ABNORMAL LOW (ref 36.0–46.0)
Hemoglobin: 9.8 g/dL — ABNORMAL LOW (ref 12.0–15.0)
MCH: 33.6 pg (ref 26.0–34.0)
MCHC: 33.1 g/dL (ref 30.0–36.0)
MCV: 101.4 fL — ABNORMAL HIGH (ref 80.0–100.0)
Platelets: 21 10*3/uL — CL (ref 150–400)
RBC: 2.92 MIL/uL — ABNORMAL LOW (ref 3.87–5.11)
RDW: 21.2 % — ABNORMAL HIGH (ref 11.5–15.5)
WBC: 3.6 10*3/uL — ABNORMAL LOW (ref 4.0–10.5)
nRBC: 0 % (ref 0.0–0.2)

## 2022-06-23 LAB — GLUCOSE, CAPILLARY
Glucose-Capillary: 127 mg/dL — ABNORMAL HIGH (ref 70–99)
Glucose-Capillary: 97 mg/dL (ref 70–99)

## 2022-06-23 MED ORDER — METOPROLOL TARTRATE 75 MG PO TABS: 150.0000 mg | ORAL_TABLET | Freq: Two times a day (BID) | ORAL | 0 refills | Status: DC

## 2022-06-23 MED ORDER — TAMSULOSIN HCL 0.4 MG PO CAPS: 0.4000 mg | ORAL_CAPSULE | Freq: Every day | ORAL | 1 refills | Status: DC

## 2022-06-23 MED ORDER — INSULIN ASPART 100 UNIT/ML IJ SOLN
0.0000 [IU] | Freq: Three times a day (TID) | INTRAMUSCULAR | Status: DC
  Administered 2022-06-23: 1 [IU] via SUBCUTANEOUS

## 2022-06-23 MED ORDER — METOPROLOL TARTRATE 50 MG PO TABS
150.0000 mg | ORAL_TABLET | Freq: Two times a day (BID) | ORAL | Status: DC
  Filled 2022-06-23: qty 1

## 2022-06-23 MED ORDER — METOPROLOL TARTRATE 50 MG PO TABS
150.0000 mg | ORAL_TABLET | Freq: Two times a day (BID) | ORAL | Status: DC
  Administered 2022-06-23: 150 mg via ORAL

## 2022-06-23 MED ORDER — DILTIAZEM HCL ER COATED BEADS 120 MG PO CP24
240.0000 mg | ORAL_CAPSULE | Freq: Every day | ORAL | Status: DC
Start: 2022-06-23 — End: 2022-06-23
  Administered 2022-06-23: 240 mg via ORAL

## 2022-06-23 MED ORDER — DICYCLOMINE HCL 10 MG PO CAPS: 10.0000 mg | ORAL_CAPSULE | Freq: Three times a day (TID) | ORAL | 0 refills | Status: DC | PRN

## 2022-06-23 MED ORDER — DILTIAZEM HCL ER COATED BEADS 120 MG PO CP24
240.0000 mg | ORAL_CAPSULE | Freq: Every day | ORAL | Status: DC
  Filled 2022-06-23: qty 2

## 2022-06-23 MED ORDER — HEPARIN SOD (PORK) LOCK FLUSH 100 UNIT/ML IV SOLN
500.0000 [IU] | INTRAVENOUS | Status: AC | PRN
  Administered 2022-06-23: 500 [IU]

## 2022-06-23 MED ORDER — DILTIAZEM HCL ER COATED BEADS 240 MG PO CP24: 240.0000 mg | ORAL_CAPSULE | Freq: Every day | ORAL | 1 refills | Status: DC

## 2022-06-23 NOTE — TOC Transition Note (Addendum)
Transition of Care (TOC) - CM/SW Discharge Note Marvetta Gibbons RN, BSN Transitions of Care Unit 4E- RN Case Manager See Treatment Team for direct phone #    Patient Details  Name: [name] MRN: [id number] Date of Birth: [birth year]  Transition of Care [facility]) CM/SW Contact:  Dawayne Patricia, RN Phone Number: 06/23/2022, 11:27 AM   Clinical Narrative:    Pt stable for transition home today. Orders have been placed for HHPT/OT- pt active with [facility] and will resume services with them. Brandi with [facility] notified for resumption of services.   No further TOC needs noted.   1400- received msg from OT that spouse was asking about private duty assistance. Patient already discharged from room, TC made to spouse to provide info. Spouse states he is still downstairs at front entrance, CM went to meet pt at car to provide with some brochures and printed "google search" list per spouse request. Spouse voices appreciation for information.    Final next level of care: Home w Home Health Services Barriers to Discharge: No Barriers Identified   Patient Goals and CMS Choice    Resumption of [place] services    Discharge Placement                 Home w/ [facility]      Discharge Plan and Services   Discharge Planning Services: CM Consult Post Acute Care Choice: Home Health, Resumption of Svcs/PTA Provider          DME Arranged: N/A DME Agency: NA       HH Arranged: PT, OT HH Agency: [place] Date [place]: 06/23/22 Time [place]: 1127 Representative spoke with at [place]: [place] ([place]) Interventions     Readmission Risk Interventions    06/23/2022   11:27 AM  Readmission Risk Prevention Plan  Transportation Screening Complete  Medication Review Press photographer) Complete  PCP or Specialist appointment within 3-5 days of discharge Complete  HRI or [place] Complete  SW Recovery  Care/Counseling Consult Complete  [place] Not Applicable

## 2022-06-23 NOTE — Discharge Summary (Signed)
Physician Discharge Summary  [name] ALP:[id number] DOB: 03/25/1946 DOA: 06/19/2022  PCP: Sherry Pillar, MD  Admit date: 06/19/2022 Discharge date: 06/23/2022  Admitted From: Home Disposition:  Home  Discharge Condition:Stable CODE STATUS:FULL Diet recommendation: Heart Healthy  Brief/Interim Summary: Patient is a 76 year old female with history of paroxysmal A-fib not on anticoagulation due to chronic thrombo-cytopenia, COPD, hyperlipidemia, CMML, rheumatoid arthritis on prednisone who presented with severe abdominal pain, diarrhea.  On presentation she was in A-fib with RVR and was started on Cardizem drip.  CT abdomen/pelvis showed possible early acute appendicitis was ruled out by general surgery later.  Cardiology was following.  Hospital course remarkable for severe anemia requiring blood transfusion, hematology were also  following.  This morning , she remains comfortable.  Her heart rate is well controlled.  Cardiology cleared for discharge.  We recommend to follow-up with cardiology in her oncologist as an outpatient.  Medically stable for discharge today.  Following problems were addressed  during her hospitalization:  A-fib with RVR: History of paroxysmal A-fib but not on anticoagulation due to thrombocytopenia, anemia from CML.  Started on Cardizem drip, now on oral Cardizem,metoprolol and heart rate is better.  Echo in April 2023 showed EF of 50%.  Currently not a candidate for DCCV or antiarrhythmic drug.   Acute on chronic systolic congestive heart failure: Echo on 4/23 showed EF of 45 to 50%.  Chest x-ray showed mild pulmonary vascular congestion.  Was given IV Lasix.  Currently she is almost euvolemic.     CMML/thrombocytopenia/anemia: Follows with oncology, receiving chemotherapy with azacitidine.  Status post 4 cycles.  Oncology recommended to follow-up as an outpatient for continued chemotherapy.  Hemoglobin dropped to the range of 6, she was transfused with 2 unit  of PRBC.  Hemoglobin stable in the range of 9 today, platelets in the range of 20,000.    Abdominal pain/suspicion for already had acute appendicitis: General surgery was also consulted, no further work-up initiated.  Abdomen symptoms have proved.  Continue Bentyl for spasms.   History of COPD: Not in exacerbation.  Continue bronchodilators as needed   Constipation: Continue bowel regimen   Rheumatoid Arthritis: On outpatient Remicade.  On prednisone 5 mg daily   Chronic bilateral pleural effusion: Currently on room air.  No respiratory symptoms.   Enterococcus UTI: Denies any dysuria.  Treated with a dose of fosfomycin.  Afebrile, no leukocytosis   Debility/deconditioning:PT /OT saw the patient and recommended home health on discharge      Discharge Diagnoses:  Principal Problem:   Atrial fibrillation with RVR ([name]) Active Problems:   Pancytopenia (HCC)   Malnutrition of moderate degree   Chronic systolic CHF (congestive heart failure) (HCC)   Stage 2 moderate COPD by GOLD classification (HCC)   CMML (chronic myelomonocytic leukemia) (HCC)   Thrombocytopenia (HCC)   Anemia of chronic disease   Bilateral pleural effusion   Constipation   Rheumatoid arthritis (HCC)   Acute urinary retention   Arterial vascular disease    Discharge Instructions  Discharge Instructions     Diet - low sodium heart healthy   Complete by: As directed    Discharge instructions   Complete by: As directed    1)Please take prescribed medications as instructed 2)Follow up with your PCP in a week.  Do a CBC test to check your platelets level 3)Follow up with your cardiologist and oncologist.   Increase activity slowly   Complete by: As directed       Allergies as  of 06/23/2022       Reactions   Digoxin And Related Rash   Generalized total body rash with immense itching   Gluten Meal Diarrhea   Penicillins Rash, Other (See Comments)   Did it involve swelling of the face/tongue/throat,  SOB, or low BP? No Did it involve sudden or severe rash/hives, skin peeling, or any reaction on the inside of your mouth or nose? Yes Did you need to seek medical attention at a hospital or doctor's office? No When did it last happen?    "Many Years Ago"  If all above answers are "NO", may proceed with cephalosporin use.   Alendronate Rash   Hydroxychloroquine Rash        Medication List     STOP taking these medications    metoprolol succinate 100 MG 24 hr tablet Commonly known as: TOPROL-XL       TAKE these medications    denosumab 60 MG/ML Sosy injection Commonly known as: PROLIA Inject 60 mg into the skin every 6 (six) months.   dicyclomine 10 MG capsule Commonly known as: BENTYL Take 1 capsule (10 mg total) by mouth 3 (three) times daily as needed for spasms.   diltiazem 240 MG 24 hr capsule Commonly known as: CARDIZEM CD Take 1 capsule (240 mg total) by mouth daily. What changed:  medication strength how much to take   furosemide 20 MG tablet Commonly known as: Lasix Take 1 tablet (20 mg total) by mouth daily.   gabapentin 300 MG capsule Commonly known as: NEURONTIN Take 300 mg by mouth in the morning.   Metoprolol Tartrate 75 MG Tabs Take 150 mg by mouth 2 (two) times daily.   potassium chloride SA 20 MEQ tablet Commonly known as: Klor-Con M20 Take 1 tablet (20 mEq total) by mouth daily. Take with furosemide   predniSONE 5 MG tablet Commonly known as: DELTASONE Take 5 mg by mouth daily with breakfast.   REMICADE IV Inject 10 mg into the vein every 2 (two) months.   Stiolto Respimat 2.5-2.5 MCG/ACT Aers Generic drug: Tiotropium Bromide-Olodaterol Inhale 2 puffs into the lungs daily.   tamsulosin 0.4 MG Caps capsule Commonly known as: FLOMAX Take 1 capsule (0.4 mg total) by mouth daily after supper.        Follow-up Information     Sherry Pillar, MD. Schedule an appointment as soon as possible for a visit in 1 week(s).   Specialty:  Family Medicine Contact information: [address] [place] [address] [phone]                Allergies  Allergen Reactions   Digoxin And Related Rash    Generalized total body rash with immense itching   Gluten Meal Diarrhea   Penicillins Rash and Other (See Comments)      Did it involve swelling of the face/tongue/throat, SOB, or low BP? No Did it involve sudden or severe rash/hives, skin peeling, or any reaction on the inside of your mouth or nose? Yes Did you need to seek medical attention at a hospital or doctor's office? No When did it last happen?    "Many Years Ago"  If all above answers are "NO", may proceed with cephalosporin use.      Alendronate Rash   Hydroxychloroquine Rash    Consultations: Cardiology, oncology   Procedures/Studies: CT Angio Abd/Pel W and/or Wo Contrast  Result Date: 06/19/2022 CLINICAL DATA:  Mesenteric ischemia Severe abdominal pain and cramping Diarrhea and constipation  Leukemia EXAM: CTA ABDOMEN AND PELVIS WITHOUT AND WITH CONTRAST TECHNIQUE: Multidetector CT imaging of the abdomen and pelvis was performed using the standard protocol during bolus administration of intravenous contrast. Multiplanar reconstructed images and MIPs were obtained and reviewed to evaluate the vascular anatomy. RADIATION DOSE REDUCTION: This exam was performed according to the departmental dose-optimization program which includes automated exposure control, adjustment of the mA and/or kV according to patient size and/or use of iterative reconstruction technique. CONTRAST:  75 mL OMNIPAQUE IOHEXOL 350 MG/ML SOLN COMPARISON:  CT chest, abdomen, and pelvis 12/23/2021 FINDINGS: VASCULAR Aorta: Diffuse atherosclerotic calcified plaque without aneurysm or flow-limiting stenosis. 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 SMA: No significant stenosis of the superior mesenteric artery. The hepatic artery is replaced radiating from the superior mesenteric artery. There are multiple irregular branches in the region of the pancreatic body which appear to be communicating between the common hepatic and splenic arteries. A small aneurysm originating from 1 of these branches measures approximately 4 mm and is located within the pancreatic body, best seen on image 102 of series 8 and image 38 of series 4. Renals: All narrowing at the origin of the left main renal artery. Moderate to severe stenosis of the origin of the right main renal artery. 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 IMA: Moderate stenosis of the origin of the inferior mesenteric artery. Inflow: Patent without evidence of aneurysm, dissection, vasculitis or significant stenosis. Proximal Outflow: Bilateral common femoral and visualized portions of the superficial and profunda femoral arteries are patent without evidence of aneurysm, dissection, vasculitis or significant stenosis. Veins: Hepatic, portal, superior mesenteric veins are patent. Review of the MIP images confirms the above findings. NON-VASCULAR Lower chest: Trace left and small right pleural effusion. Previously seen pleural implant of the posterior right pleural space is slightly larger on today's examination measuring approximately 2.5 x 1.2 cm compared to 2.0 x 0.9 cm on the prior study. Additional soft tissue implant in the right paraspinal region, best seen on image 4 of series 11 is similar in size to slightly smaller since prior examination. Hepatobiliary: 1.6 cm simple cyst present in the left hepatic lobe. Liver, gallbladder common bile duct otherwise normal. The Pancreas: Unremarkable. No pancreatic ductal dilatation or surrounding inflammatory changes. Spleen: Normal in size without focal abnormality.  Adrenals/Urinary Tract: Adrenal glands are normal. 3.5 cm simple cyst present in the lower pole of the right kidney. 1.3 cm simple cyst in the upper pole of the left kidney. Additional subcentimeter renal hypodensities are too small to fully characterize. Kidneys, adrenal glands, the adrenal glands, and bladder otherwise unremarkable. Stomach/Bowel: No significant abnormality of the stomach. Small duodenal diverticulum. There is mild thickening of the distal appendix best seen on image 57 of series 7 with the diameter measuring up to 7 mm. More distally within the appendix, best seen on image 53 of series 7, there are small foci of air which appears to be within the wall of the appendix or just adjacent to it. Extensive diverticulosis of the descending and sigmoid colon without evidence of acute diverticulitis. No dilated bowel loops to indicate ileus or obstruction. 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 No enlarged mesenteric, retroperitoneal, or pelvic lymph. Reproductive: Uterus and bilateral adnexa are  unremarkable. Other: No abdominal wall hernia or abnormality. No abdominopelvic ascites. Musculoskeletal: Dextroconvex scoliosis of the thoracolumbar spine with multilevel degenerative changes seen throughout the lumbar segments. IMPRESSION: VASCULAR 1. 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 2. No significant stenosis of the superior mesenteric artery. Common hepatic artery is replaced, originating from the superior mesenteric artery. 3. 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 4. Abnormal tangle of arteries communicating between the common hepatic and splenic artery, traversing the pancreatic body. 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 5. Moderate to severe stenosis at the origin of the right renal artery. 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 NON-VASCULAR 1. Borderline dilatation of the distal appendix with a small focus of air which appears to be within the wall of the appendix or just adjacent to it. These findings are suspicious for early appendicitis. 2. Extensive distal colonic diverticulosis without definitive evidence of acute diverticulitis. 3. Unchanged large presacral mass. 4. Interval development of small right pleural effusion with interval enlargement of posterior pleural implant. Electronically Signed   By: Miachel Roux M.D.   On: 06/19/2022 16:41   DG Chest Portable 1 View  Result Date: 06/19/2022 CLINICAL DATA:  Atrial fibrillation EXAM: PORTABLE CHEST 1 VIEW COMPARISON:  05/19/2022 FINDINGS: Bilateral mild interstitial thickening. Trace bilateral pleural effusions. No pneumothorax. Stable cardiomegaly. No acute osseous abnormality. Right-sided Port-A-Cath in satisfactory position. Thoracic aortic atherosclerosis. Total reverse right shoulder arthroplasty. No acute osseous abnormality. IMPRESSION: 1. Mild pulmonary vascular congestion. Electronically Signed   By: Kathreen Devoid M.D.   On: 06/19/2022 15:25      Subjective: Patient seen and examined at the bedside this morning.  Hemodynamically stable for discharge.  Discharge planning discussed with husband at bedside  Discharge Exam: Vitals:   06/23/22 0426 06/23/22 0700  BP: 119/63 114/71  Pulse: 84 90  Resp: 16 16  Temp: 97.9 F (36.6 C) 98.3 F (36.8 C)  SpO2: 94% 98%   Vitals:   06/22/22 1948 06/22/22 2350 06/23/22 0426 06/23/22 0700  BP: 118/83 (!) 147/77 119/63 114/71  Pulse: 100 (!) 102 84 90  Resp: 18 18 16 16   Temp: 97.9 F (36.6 C) 98.2 F (36.8 C) 97.9 F (36.6 C) 98.3 F (36.8 C)  TempSrc: Oral Oral Oral Oral  SpO2: 97% 95% 94% 98%  Weight:      Height:        General: Pt is alert, awake, not in acute distress Cardiovascular: Irregularly irregular rhythm, S1/S2 +, no rubs, no gallops Respiratory: CTA bilaterally, no  wheezing, no rhonchi Abdominal: Soft, NT, ND, bowel sounds + Extremities: no edema, no cyanosis    The results of significant diagnostics from this hospitalization (including imaging, microbiology, ancillary and laboratory) are listed below for reference.     Microbiology: Recent Results (from the past 240 hour(s))  SARS Coronavirus 2 by RT PCR (hospital order, performed in [facility] hospital lab) *cepheid single result test* Urine, Catheterized     Status: None   Collection Time: 06/19/22 11:30 PM   Specimen: Urine, Catheterized; Nasal Swab  Result Value Ref Range Status   SARS Coronavirus 2 by RT PCR NEGATIVE NEGATIVE Final    Comment: (NOTE) SARS-CoV-2 target nucleic acids are NOT DETECTED.  The SARS-CoV-2 RNA is generally detectable in upper and lower respiratory specimens during the acute phase of infection. The lowest concentration of  SARS-CoV-2 viral copies this assay can detect is 250 copies / mL. A negative result does not preclude SARS-CoV-2 infection and should not be used as the sole basis for treatment or other patient management decisions.  A negative result may occur with improper specimen collection / handling, submission of specimen other than nasopharyngeal swab, presence of viral mutation(s) within the areas targeted by this assay, and inadequate number of viral copies (<250 copies / mL). A negative result must be combined with clinical observations, patient history, and epidemiological information.  Fact Sheet for Patients:   https://www.patel.info/  Fact Sheet for Healthcare Providers: https://hall.com/  This test is not yet approved or  cleared by the [place] FDA and has been authorized for detection and/or diagnosis of SARS-CoV-2 by FDA under an Emergency Use Authorization (EUA).  This EUA will remain in effect (meaning this test can be used) for the duration of the COVID-19 declaration under Section  564(b)(1) of the Act, 21 U.S.C. section 360bbb-3(b)(1), unless the authorization is terminated or revoked sooner.  Performed at [facility], [place] [address] [place], [place] [address]   Urine Culture     Status: Abnormal   Collection Time: 06/19/22 11:45 PM   Specimen: In/Out Cath Urine  Result Value Ref Range Status   Specimen Description IN/OUT CATH URINE  Final   Special Requests   Final    NONE Performed at [facility], [place] [address], [place], [place] [address]    Culture >=100,000 COLONIES/mL ENTEROCOCCUS FAECALIS (A)  Final   Report Status 06/21/2022 FINAL  Final   Organism ID, Bacteria ENTEROCOCCUS FAECALIS (A)  Final      Susceptibility   Enterococcus faecalis - MIC*    AMPICILLIN <=2 SENSITIVE Sensitive     NITROFURANTOIN <=16 SENSITIVE Sensitive     VANCOMYCIN 1 SENSITIVE Sensitive     * >=100,000 COLONIES/mL ENTEROCOCCUS FAECALIS     Labs: BNP (last 3 results) Recent Labs    03/29/22 1406 05/19/22 1320 05/21/22 0345  BNP 474.3* 762.2* 098.1*   Basic Metabolic Panel: Recent Labs  Lab 06/19/22 1221 06/19/22 1510 06/20/22 0125 06/20/22 0342 06/21/22 0305 06/22/22 0425  NA 136  --  137  --  134* 136  K 4.0  --  3.0*  --  3.6 3.7  CL 99  --  104  --  103 106  CO2 27  --  27  --  25 24  GLUCOSE 148*  --  92  --  110* 105*  BUN 31*  --  20  --  11 12  CREATININE 1.05*  --  0.80  --  0.70 0.70  CALCIUM 8.8*  --  7.6*  --  7.1* 7.8*  MG  --  2.1  --  2.1 1.8  --   PHOS 3.1  --   --   --   --   --    Liver Function Tests: Recent Labs  Lab 06/19/22 1221 06/20/22 0125 06/21/22 0305 06/22/22 0425  AST 12* 11* 13* 18  ALT 14 16 15 19   ALKPHOS 40 34* 29* 35*  BILITOT 1.4* 1.2 0.9 1.5*  PROT 5.4* 4.3* 3.9* 4.3*  ALBUMIN 3.6 2.5* 2.3* 2.3*   Recent Labs  Lab 06/19/22 1221  LIPASE 32   No results for input(s): "AMMONIA" in the last 168 hours. CBC: Recent Labs  Lab 06/19/22 1221 06/20/22 0125 06/21/22 0305 06/22/22 0425  06/23/22 0807  WBC 14.8* 9.7 7.2 5.6 3.6*  NEUTROABS 10.3*  --   --   --   --   HGB 9.3* 7.8* 6.5* 9.3* 9.8*  HCT 29.4* 23.3* 20.3* 28.3* 29.6*  MCV 108.1* 109.4* 111.5* 102.2* 101.4*  PLT 63* 36* 26* 25* 21*   Cardiac Enzymes: Recent Labs  Lab 06/20/22 0125  CKTOTAL 13*   BNP: Invalid input(s): "POCBNP" CBG: Recent Labs  Lab 06/22/22 0839 06/22/22 1147 06/22/22 2034 06/22/22 2357 06/23/22 0815  GLUCAP 159* 112* 101* 124* 127*   D-Dimer No results for input(s): "DDIMER" in the last 72 hours. Hgb A1c No results for input(s): "HGBA1C" in the last 72 hours. Lipid Profile No results for input(s): "CHOL", "HDL", "LDLCALC", "TRIG", "CHOLHDL", "LDLDIRECT" in the last 72 hours. Thyroid function studies No results for input(s): "TSH", "T4TOTAL", "T3FREE", "THYROIDAB" in the last 72 hours.  Invalid input(s): "FREET3" Anemia work up No results for input(s): "VITAMINB12", "FOLATE", "FERRITIN", "TIBC", "IRON", "RETICCTPCT" in the last 72 hours. Urinalysis    Component Value Date/Time   COLORURINE YELLOW 06/19/2022 2259   APPEARANCEUR CLEAR 06/19/2022 2259   LABSPEC 1.031 (H) 06/19/2022 2259   PHURINE 6.0 06/19/2022 2259   GLUCOSEU NEGATIVE 06/19/2022 2259   HGBUR NEGATIVE 06/19/2022 2259   BILIRUBINUR NEGATIVE 06/19/2022 2259   KETONESUR NEGATIVE 06/19/2022 2259   PROTEINUR NEGATIVE 06/19/2022 2259   UROBILINOGEN 0.2 04/28/2011 1859   NITRITE NEGATIVE 06/19/2022 2259   LEUKOCYTESUR NEGATIVE 06/19/2022 2259   Sepsis Labs Recent Labs  Lab 06/20/22 0125 06/21/22 0305 06/22/22 0425 06/23/22 0807  WBC 9.7 7.2 5.6 3.6*   Microbiology Recent Results (from the past 240 hour(s))  SARS Coronavirus 2 by RT PCR (hospital order, performed in [facility] hospital lab) *cepheid single result test* Urine, Catheterized     Status: None   Collection Time: 06/19/22 11:30 PM   Specimen: Urine, Catheterized; Nasal Swab  Result Value Ref Range Status   SARS Coronavirus 2 by RT PCR  NEGATIVE NEGATIVE Final    Comment: (NOTE) SARS-CoV-2 target nucleic acids are NOT DETECTED.  The SARS-CoV-2 RNA is generally detectable in upper and lower respiratory specimens during the acute phase of infection. The lowest concentration of SARS-CoV-2 viral copies this assay can detect is 250 copies / mL. A negative result does not preclude SARS-CoV-2 infection and should not be used as the sole basis for treatment or other patient management decisions.  A negative result may occur with improper specimen collection / handling, submission of specimen other than nasopharyngeal swab, presence of viral mutation(s) within the areas targeted by this assay, and inadequate number of viral copies (<250 copies / mL). A negative result must be combined with clinical observations, patient history, and epidemiological information.  Fact Sheet for Patients:   https://www.patel.info/  Fact Sheet for Healthcare Providers: https://hall.com/  This test is not yet approved or  cleared by the [place] FDA and has been authorized for detection and/or diagnosis of SARS-CoV-2 by FDA under an Emergency Use Authorization (EUA).  This EUA will remain in effect (meaning this test can be used) for the duration of the COVID-19 declaration under Section 564(b)(1) of the Act, 21 U.S.C. section 360bbb-3(b)(1), unless the authorization is terminated or revoked sooner.  Performed at [facility], [place] [address], [place], [place] [address]   Urine Culture     Status: Abnormal   Collection Time: 06/19/22 11:45 PM   Specimen: In/Out Cath Urine  Result Value Ref Range Status   Specimen Description IN/OUT CATH URINE  Final   Special  Requests   Final    NONE Performed at [facility], [place] [address], [place], [place] [address]    Culture >=100,000 COLONIES/mL ENTEROCOCCUS FAECALIS (A)  Final   Report Status 06/21/2022 FINAL  Final   Organism ID,  Bacteria ENTEROCOCCUS FAECALIS (A)  Final      Susceptibility   Enterococcus faecalis - MIC*    AMPICILLIN <=2 SENSITIVE Sensitive     NITROFURANTOIN <=16 SENSITIVE Sensitive     VANCOMYCIN 1 SENSITIVE Sensitive     * >=100,000 COLONIES/mL ENTEROCOCCUS FAECALIS    Please note: You were cared for by a hospitalist during your hospital stay. Once you are discharged, your primary care physician will handle any further medical issues. Please note that NO REFILLS for any discharge medications will be authorized once you are discharged, as it is imperative that you return to your primary care physician (or establish a relationship with a primary care physician if you do not have one) for your post hospital discharge needs so that they can reassess your need for medications and monitor your lab values.    Time coordinating discharge: 40 minutes  SIGNED:   Shelly Coss, MD  [facility] 06/23/2022, 10:58 AM Pager [phone]  If 7PM-7AM, please contact night-coverage [email or web address] Password [email or web address]

## 2022-06-23 NOTE — Progress Notes (Signed)
Mobility Specialist Progress Note:   06/23/22 0949  Mobility  Activity Refused mobility   Pt having cramps right now and not wanting to ambulate. Will follow-up as time allows.   [facility] Mishawn Hemann Mobility Specialist

## 2022-06-23 NOTE — Plan of Care (Addendum)
Will plan to change to her to metop 150 mg BID tartrate. Will consolidate her diltiazem today to 240 mg  daily. Otherwise she is stable from a cardiac standpoint for discharge today.

## 2022-06-23 NOTE — Progress Notes (Signed)
Occupational Therapy Treatment Patient Details Name: [name] MRN: [id number] DOB: 02/19/1946 Today's Date: 06/23/2022   History of present illness Pt is a 76 y.o. female admitted 06/19/22 due to worsening abdominal pain. Workup for afib with RVR. Per general surgery consult, abdominal tenderness secondary to urinary retention; not consistent with appendicitis. 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   OT comments  Pt progressing towards OT goals this session. Pt able to complete BSC transfer at supervision level, manage own peri care in standing with LOB. Pt supervision for UB dressing, and min A for LB dressing. Pt fully dressed at the end of session. Husband "Hezzie Bump" asking about extra caregiving for the home environment - messaged CM. Current OT plan remains appropriate.    Recommendations for follow up therapy are one component of a multi-disciplinary discharge planning process, led by the attending physician.  Recommendations may be updated based on patient status, additional functional criteria and insurance authorization.    Follow Up Recommendations  Other (comment) ([place] aide)    Assistance Recommended at Discharge Frequent or constant Supervision/Assistance  Patient can return home with the following  A little help with walking and/or transfers;A little help with bathing/dressing/bathroom;Assistance with cooking/housework;Assist for transportation   Equipment Recommendations  None recommended by OT (Pt has appropriate DME)    Recommendations for Other Services      Precautions / Restrictions Precautions Precautions: Fall;Other (comment) Precaution Comments: watch HR Restrictions Weight Bearing Restrictions: No       Mobility Bed Mobility Overal bed mobility: Modified Independent             General bed mobility comments: HOB elevated    Transfers Overall transfer level: Needs assistance Equipment used:  Rolling walker (2 wheels), None Transfers: Sit to/from Stand, Bed to chair/wheelchair/BSC Sit to Stand: Min guard     Step pivot transfers: Min guard     General transfer comment: min guard for safety     Balance Overall balance assessment: Needs assistance   Sitting balance-Leahy Scale: Good     Standing balance support: No upper extremity supported, Bilateral upper extremity supported Standing balance-Leahy Scale: Fair Standing balance comment: Pt was able to stand with no BUE support for peri care and dressing tasks                           ADL either performed or assessed with clinical judgement   ADL Overall ADL's : Needs assistance/impaired     Grooming: Wash/dry face;Set up;Sitting           Upper Body Dressing : Set up;Sitting Upper Body Dressing Details (indicate cue type and reason): donning 2 shirts Lower Body Dressing: Minimal assistance;Cueing for safety;Cueing for sequencing;Sit to/from stand Lower Body Dressing Details (indicate cue type and reason): to don briefs, pants, and shoes Toilet Transfer: Supervision/safety;BSC/3in1;Stand-pivot Toilet Transfer Details (indicate cue type and reason): chose BSC in lieu of bathroom toilet Toileting- Clothing Manipulation and Hygiene: Min guard;Cueing for safety;Cueing for sequencing;Sit to/from stand       Functional mobility during ADLs: Min guard (stand pivot transfers only) General ADL Comments: DOE 2/4 after transfers and dressing    Extremity/Trunk Assessment Upper Extremity Assessment Upper Extremity Assessment: Generalized weakness   Lower Extremity Assessment Lower Extremity Assessment: Generalized weakness (edema)        Vision       Perception     Praxis  Cognition Arousal/Alertness: Awake/alert Behavior During Therapy: WFL for tasks assessed/performed, Flat affect Overall Cognitive Status: Within Functional Limits for tasks assessed                                  General Comments: WFL for simple tasks, not formally assessed        Exercises Exercises: Other exercises    Shoulder Instructions       General Comments VSS On RA    Pertinent Vitals/ Pain       Pain Assessment Pain Assessment: No/denies pain Pain Intervention(s): Monitored during session, Repositioned  Home Living                                          Prior Functioning/Environment              Frequency  Min 2X/week        Progress Toward Goals  OT Goals(current goals can now be found in the care plan section)  Progress towards OT goals: Progressing toward goals  Acute Rehab OT Goals Patient Stated Goal: get home OT Goal Formulation: With patient Time For Goal Achievement: 07/04/22 Potential to Achieve Goals: Good ADL Goals Pt Will Perform Upper Body Bathing: Independently;sitting Pt Will Perform Lower Body Bathing: with modified independence;sit to/from stand;with adaptive equipment Pt Will Transfer to Toilet: with supervision;ambulating;regular height toilet  Plan Discharge plan remains appropriate    Co-evaluation                 AM-PAC OT "6 Clicks" Daily Activity     Outcome Measure   Help from another person eating meals?: None Help from another person taking care of personal grooming?: None Help from another person toileting, which includes using toliet, bedpan, or urinal?: A Little Help from another person bathing (including washing, rinsing, drying)?: A Little Help from another person to put on and taking off regular upper body clothing?: None Help from another person to put on and taking off regular lower body clothing?: A Little 6 Click Score: 21    End of Session    OT Visit Diagnosis: Unsteadiness on feet (R26.81);Other abnormalities of gait and mobility (R26.89);Muscle weakness (generalized) (M62.81)   Activity Tolerance Patient tolerated treatment well   Patient Left in chair;with call  bell/phone within reach;with family/visitor present   Nurse Communication Mobility status;Other (comment) (dressed and ready for DC, husband asking about caregiving services)        Time: 3094-0768 OT Time Calculation (min): 30 min  Charges: OT General Charges $OT Visit: 1 Visit OT Treatments $Self Care/Home Management : 23-37 mins  Jesse Sans OTR/L Acute Rehabilitation Services Office: [place] 06/23/2022, 12:55 PM

## 2022-06-23 NOTE — Progress Notes (Signed)
Foley catheter removed per MD order without difficulty.

## 2022-06-23 NOTE — Progress Notes (Signed)
Order received to discharge patient.  Telemetry monitor removed and CCMD notified.  Port-a-cath deaccessed by IV team.  Discharge instructions, follow up, medications and instructions for their use discussed with patient and husband at bedside.

## 2022-06-24 DIAGNOSIS — C931 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: Secondary | ICD-10-CM | POA: Diagnosis not present

## 2022-06-24 DIAGNOSIS — I130 Hypertensive heart and chronic kidney disease with heart failure and stage 1 through stage 4 chronic kidney disease, or unspecified chronic kidney disease: Secondary | ICD-10-CM | POA: Diagnosis not present

## 2022-06-24 DIAGNOSIS — N1831 Chronic kidney disease, stage 3a: Secondary | ICD-10-CM | POA: Diagnosis not present

## 2022-06-24 DIAGNOSIS — I5033 Acute on chronic diastolic (congestive) heart failure: Secondary | ICD-10-CM | POA: Diagnosis not present

## 2022-06-24 DIAGNOSIS — D630 Anemia in neoplastic disease: Secondary | ICD-10-CM | POA: Diagnosis not present

## 2022-06-24 DIAGNOSIS — I5022 Chronic systolic (congestive) heart failure: Secondary | ICD-10-CM | POA: Diagnosis not present

## 2022-06-26 ENCOUNTER — Other Ambulatory Visit: Payer: Self-pay

## 2022-06-26 ENCOUNTER — Inpatient Hospital Stay: Payer: Medicare Other | Attending: Nurse Practitioner

## 2022-06-26 DIAGNOSIS — D696 Thrombocytopenia, unspecified: Secondary | ICD-10-CM | POA: Diagnosis not present

## 2022-06-26 DIAGNOSIS — Z79899 Other long term (current) drug therapy: Secondary | ICD-10-CM | POA: Insufficient documentation

## 2022-06-26 DIAGNOSIS — Z5111 Encounter for antineoplastic chemotherapy: Secondary | ICD-10-CM | POA: Diagnosis not present

## 2022-06-26 DIAGNOSIS — C931 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: Secondary | ICD-10-CM | POA: Diagnosis not present

## 2022-06-26 DIAGNOSIS — M069 Rheumatoid arthritis, unspecified: Secondary | ICD-10-CM | POA: Diagnosis not present

## 2022-06-26 DIAGNOSIS — D638 Anemia in other chronic diseases classified elsewhere: Secondary | ICD-10-CM | POA: Diagnosis not present

## 2022-06-26 DIAGNOSIS — I480 Paroxysmal atrial fibrillation: Secondary | ICD-10-CM | POA: Diagnosis not present

## 2022-06-26 DIAGNOSIS — I5023 Acute on chronic systolic (congestive) heart failure: Secondary | ICD-10-CM | POA: Diagnosis not present

## 2022-06-26 LAB — CBC WITH DIFFERENTIAL/PLATELET
Abs Immature Granulocytes: 0.02 10*3/uL (ref 0.00–0.07)
Basophils Absolute: 0 10*3/uL (ref 0.0–0.1)
Basophils Relative: 1 %
Eosinophils Absolute: 0.2 10*3/uL (ref 0.0–0.5)
Eosinophils Relative: 4 %
HCT: 30.2 % — ABNORMAL LOW (ref 36.0–46.0)
Hemoglobin: 10 g/dL — ABNORMAL LOW (ref 12.0–15.0)
Immature Granulocytes: 0 %
Lymphocytes Relative: 21 %
Lymphs Abs: 1 10*3/uL (ref 0.7–4.0)
MCH: 33.8 pg (ref 26.0–34.0)
MCHC: 33.1 g/dL (ref 30.0–36.0)
MCV: 102 fL — ABNORMAL HIGH (ref 80.0–100.0)
Monocytes Absolute: 0.7 10*3/uL (ref 0.1–1.0)
Monocytes Relative: 15 %
Neutro Abs: 2.8 10*3/uL (ref 1.7–7.7)
Neutrophils Relative %: 59 %
Platelets: 29 10*3/uL — ABNORMAL LOW (ref 150–400)
RBC: 2.96 MIL/uL — ABNORMAL LOW (ref 3.87–5.11)
RDW: 19.5 % — ABNORMAL HIGH (ref 11.5–15.5)
WBC: 4.8 10*3/uL (ref 4.0–10.5)
nRBC: 0 % (ref 0.0–0.2)

## 2022-06-26 LAB — CMP (CANCER CENTER ONLY)
ALT: 17 U/L (ref 0–44)
AST: 14 U/L — ABNORMAL LOW (ref 15–41)
Albumin: 3.4 g/dL — ABNORMAL LOW (ref 3.5–5.0)
Alkaline Phosphatase: 43 U/L (ref 38–126)
Anion gap: 8 (ref 5–15)
BUN: 15 mg/dL (ref 8–23)
CO2: 26 mmol/L (ref 22–32)
Calcium: 9.1 mg/dL (ref 8.9–10.3)
Chloride: 103 mmol/L (ref 98–111)
Creatinine: 1.02 mg/dL — ABNORMAL HIGH (ref 0.44–1.00)
GFR, Estimated: 57 mL/min — ABNORMAL LOW (ref >60)
Glucose, Bld: 105 mg/dL — ABNORMAL HIGH (ref 70–99)
Potassium: 4.8 mmol/L (ref 3.5–5.1)
Sodium: 137 mmol/L (ref 135–145)
Total Bilirubin: 1 mg/dL (ref 0.3–1.2)
Total Protein: 5.4 g/dL — ABNORMAL LOW (ref 6.5–8.1)

## 2022-06-28 ENCOUNTER — Encounter: Payer: Self-pay | Admitting: Cardiovascular Disease

## 2022-06-28 DIAGNOSIS — M0579 Rheumatoid arthritis with rheumatoid factor of multiple sites without organ or systems involvement: Secondary | ICD-10-CM | POA: Diagnosis not present

## 2022-06-28 NOTE — Progress Notes (Unsigned)
Cardiology Office Note:    Date:  06/28/2022   ID:  [name], DOB 05/29/1946, MRN [id number]  PCP:  Sherry Pillar, MD   [facility] Providers Cardiologist:  Sherry Moores, MD     Referring MD: Sherry Pillar, MD   Chief Complaint  Patient presents with   Atrial Fibrillation         Oct. 11, 2022   [name] is a 75 y.o. female with a hx of back pain,  CKD, HLD, COPD, paroxysmal A. fib.  We are asked to see her today by Dr. Laurann [name] for further evaluation and management of her paroxysmal atrial fibrillation.  She has not yet been started on anticoagulation. CHADS2VASC is 17 ( female, age, HTN)   No hx of CVA, CHF, DM , no vascular disease    Asymptomatic  Cannot tell when her HR is irreg.  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 Laurann [name] indicate a previous GI bleed and colonoscopy with Dr [name] - she does not recall this .   Jan. 17, 2023 : Seen for her PAF  Is due for her colonoscopy   Instructed her to hold eliquis for 2 days  No symptoms that she could say is Afib   Apr 28, 2022:  [name] is seen for follow up of her PAF. 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 Hx of COPD Drinks wine She is not on DOAC due to chronic thrombocytopenia Hx of HLD, DML Has severe anemia - received transfusions  EF is 45-50%   Past Medical History:  Diagnosis Date   Arthritis    Rheumatoid arthritis   Celiac disease    Chronic kidney disease    stage 3 from MD notes   COPD (chronic obstructive pulmonary disease) (HCC)    Dyspnea     with going up stairs   Family history of adverse reaction to anesthesia    father had hard time waking up   Headache    sinus headaches   Hot flashes    Hypertension    Iron deficiency anemia    Pneumonia    per patient "I have walking pneumonia"    Past Surgical History:  Procedure Laterality Date   COLONOSCOPY     ECTOPIC PREGNANCY SURGERY      x 2   IR IMAGING GUIDED PORT INSERTION  01/31/2022   REVERSE SHOULDER ARTHROPLASTY Right 02/02/2017   Procedure: RIGHT REVERSE SHOULDER ARTHROPLASTY;  Surgeon: Sherry Cedars, MD;  Location: [place];  Service: Orthopedics;  Laterality: Right;    Current Medications: No outpatient medications have been marked as taking for the 06/29/22 encounter (Office Visit) with [name], Sherry Cheng, MD.     Allergies:   Digoxin and related, Gluten meal, Penicillins, Alendronate, and Hydroxychloroquine   Social History   Socioeconomic History   Marital status: Married    Spouse name: Not on file   Number of children: Not on file   Years  of education: Not on file   Highest education level: Not on file  Occupational History   Not on file  Tobacco Use   Smoking status: Former    Packs/day: 1.00    Years: 30.00    Total pack years: 30.00    Types: Cigarettes    Quit date: 2005    Years since quitting: 18.5   Smokeless tobacco: Never  Vaping Use   Vaping Use: Never used  Substance and Sexual Activity   Alcohol use: Yes    Alcohol/week: 12.0 - 14.0 standard drinks of alcohol    Types: 12 - 14 Glasses of wine per week   Drug use: No   Sexual activity: Not Currently  Other Topics Concern   Not on file  Social History Narrative   Not on file   Social Determinants of Health   Financial Resource Strain: Not on file  Food Insecurity: Not on file  Transportation Needs: Not on file  Physical Activity: Not on file  Stress: Not on file  Social Connections: Not on file     Family History: The patient's family history includes Diabetes in her father;  Peripheral Artery Disease in her father.  ROS:   Please see the history of present illness.     All other systems reviewed and are negative.  EKGs/Labs/Other Studies Reviewed:    The following studies were reviewed today: - previous ecgs     Recent Labs: 03/29/2022: TSH 0.731 05/21/2022: B Natriuretic Peptide 148.4 06/21/2022: Magnesium 1.8 06/26/2022: ALT 17; BUN 15; Creatinine 1.02; Hemoglobin 10.0; Platelets 29; Potassium 4.8; Sodium 137  Recent Lipid Panel No results found for: "CHOL", "TRIG", "HDL", "CHOLHDL", "VLDL", "LDLCALC", "LDLDIRECT"   Risk Assessment/Calculations:    CHA2DS2-VASc Score = 4  } This indicates a 4.8% annual risk of stroke. The patient's score is based upon: CHF History: 0 HTN History: 1 Diabetes History: 0 Stroke History: 0 Vascular Disease History: 0 Age Score: 2 Gender Score: 1         Physical Exam:    Physical Exam: There were no vitals taken for this visit.  GEN:  Well nourished, well developed in no acute distress HEENT: Normal NECK: No JVD; No carotid bruits LYMPHATICS: No lymphadenopathy CARDIAC: RRR ***, no murmurs, rubs, gallops RESPIRATORY:  Clear to auscultation without rales, wheezing or rhonchi  ABDOMEN: Soft, non-tender, non-distended MUSCULOSKELETAL:  No edema; No deformity  SKIN: Warm and dry NEUROLOGIC:  Alert and oriented x 3    EKG:     ASSESSMENT:    No diagnosis found.    PLAN:      Atrial fib:             Medication Adjustments/Labs and Tests Ordered: Current medicines are reviewed at length with the patient today.  Concerns regarding medicines are outlined above.  No orders of the defined types were placed in this encounter.   No orders of the defined types were placed in this encounter.    There are no Patient Instructions on file for this visit.   Signed, Sherry Moores, MD  06/28/2022 8:44 PM    [place]

## 2022-06-29 ENCOUNTER — Encounter: Payer: Self-pay | Admitting: Cardiovascular Disease

## 2022-06-29 ENCOUNTER — Ambulatory Visit (INDEPENDENT_AMBULATORY_CARE_PROVIDER_SITE_OTHER): Payer: Medicare Other | Admitting: Cardiovascular Disease

## 2022-06-29 VITALS — BP 96/58 | HR 93 | Ht 63.0 in | Wt 118.2 lb

## 2022-06-29 DIAGNOSIS — E440 Moderate protein-calorie malnutrition: Secondary | ICD-10-CM | POA: Diagnosis not present

## 2022-06-29 DIAGNOSIS — B952 Enterococcus as the cause of diseases classified elsewhere: Secondary | ICD-10-CM | POA: Diagnosis not present

## 2022-06-29 DIAGNOSIS — C931 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: Secondary | ICD-10-CM | POA: Diagnosis not present

## 2022-06-29 DIAGNOSIS — I4891 Unspecified atrial fibrillation: Secondary | ICD-10-CM | POA: Diagnosis not present

## 2022-06-29 DIAGNOSIS — D539 Nutritional anemia, unspecified: Secondary | ICD-10-CM | POA: Diagnosis not present

## 2022-06-29 DIAGNOSIS — K900 Celiac disease: Secondary | ICD-10-CM | POA: Diagnosis not present

## 2022-06-29 DIAGNOSIS — R627 Adult failure to thrive: Secondary | ICD-10-CM

## 2022-06-29 DIAGNOSIS — I429 Cardiomyopathy, unspecified: Secondary | ICD-10-CM | POA: Diagnosis not present

## 2022-06-29 DIAGNOSIS — I5022 Chronic systolic (congestive) heart failure: Secondary | ICD-10-CM | POA: Diagnosis not present

## 2022-06-29 DIAGNOSIS — I130 Hypertensive heart and chronic kidney disease with heart failure and stage 1 through stage 4 chronic kidney disease, or unspecified chronic kidney disease: Secondary | ICD-10-CM | POA: Diagnosis not present

## 2022-06-29 DIAGNOSIS — I1310 Hypertensive heart and chronic kidney disease without heart failure, with stage 1 through stage 4 chronic kidney disease, or unspecified chronic kidney disease: Secondary | ICD-10-CM | POA: Diagnosis not present

## 2022-06-29 DIAGNOSIS — I5043 Acute on chronic combined systolic (congestive) and diastolic (congestive) heart failure: Secondary | ICD-10-CM | POA: Diagnosis not present

## 2022-06-29 DIAGNOSIS — N1831 Chronic kidney disease, stage 3a: Secondary | ICD-10-CM | POA: Diagnosis not present

## 2022-06-29 DIAGNOSIS — I480 Paroxysmal atrial fibrillation: Secondary | ICD-10-CM | POA: Diagnosis not present

## 2022-06-29 DIAGNOSIS — J9601 Acute respiratory failure with hypoxia: Secondary | ICD-10-CM | POA: Diagnosis not present

## 2022-06-29 DIAGNOSIS — D508 Other iron deficiency anemias: Secondary | ICD-10-CM | POA: Diagnosis not present

## 2022-06-29 DIAGNOSIS — D630 Anemia in neoplastic disease: Secondary | ICD-10-CM | POA: Diagnosis not present

## 2022-06-29 DIAGNOSIS — D696 Thrombocytopenia, unspecified: Secondary | ICD-10-CM | POA: Diagnosis not present

## 2022-06-29 DIAGNOSIS — J449 Chronic obstructive pulmonary disease, unspecified: Secondary | ICD-10-CM | POA: Diagnosis not present

## 2022-06-29 DIAGNOSIS — D61818 Other pancytopenia: Secondary | ICD-10-CM | POA: Diagnosis not present

## 2022-06-29 DIAGNOSIS — E1122 Type 2 diabetes mellitus with diabetic chronic kidney disease: Secondary | ICD-10-CM | POA: Diagnosis not present

## 2022-06-29 DIAGNOSIS — M069 Rheumatoid arthritis, unspecified: Secondary | ICD-10-CM | POA: Diagnosis not present

## 2022-06-29 DIAGNOSIS — D631 Anemia in chronic kidney disease: Secondary | ICD-10-CM | POA: Diagnosis not present

## 2022-06-29 DIAGNOSIS — K5900 Constipation, unspecified: Secondary | ICD-10-CM | POA: Diagnosis not present

## 2022-06-29 DIAGNOSIS — R339 Retention of urine, unspecified: Secondary | ICD-10-CM | POA: Diagnosis not present

## 2022-06-29 DIAGNOSIS — N390 Urinary tract infection, site not specified: Secondary | ICD-10-CM | POA: Diagnosis not present

## 2022-06-29 DIAGNOSIS — R32 Unspecified urinary incontinence: Secondary | ICD-10-CM | POA: Diagnosis not present

## 2022-06-29 DIAGNOSIS — I2699 Other pulmonary embolism without acute cor pulmonale: Secondary | ICD-10-CM | POA: Diagnosis not present

## 2022-06-29 DIAGNOSIS — Z7952 Long term (current) use of systemic steroids: Secondary | ICD-10-CM | POA: Diagnosis not present

## 2022-06-29 NOTE — Patient Instructions (Addendum)
Medication Instructions:  Your physician recommends that you continue on your current medications as directed. Please refer to the Current Medication list given to you today.  *If you need a refill on your cardiac medications before your next appointment, please call your pharmacy*   Follow-Up: At [facility], you and your health needs are our priority.  As part of our continuing mission to provide you with exceptional heart care, we have created designated Provider Care Teams.  These Care Teams include your primary Cardiologist (physician) and Advanced Practice Providers (APPs -  Physician Assistants and Nurse Practitioners) who all work together to provide you with the care you need, when you need it.     Your next appointment:   6 mths  The format for your next appointment:   In Person  Provider:   Mertie Moores, MD    Important Information About Sugar

## 2022-07-04 DIAGNOSIS — D631 Anemia in chronic kidney disease: Secondary | ICD-10-CM | POA: Diagnosis not present

## 2022-07-04 DIAGNOSIS — I5043 Acute on chronic combined systolic (congestive) and diastolic (congestive) heart failure: Secondary | ICD-10-CM | POA: Diagnosis not present

## 2022-07-04 DIAGNOSIS — E1122 Type 2 diabetes mellitus with diabetic chronic kidney disease: Secondary | ICD-10-CM | POA: Diagnosis not present

## 2022-07-04 DIAGNOSIS — N1831 Chronic kidney disease, stage 3a: Secondary | ICD-10-CM | POA: Diagnosis not present

## 2022-07-04 DIAGNOSIS — I130 Hypertensive heart and chronic kidney disease with heart failure and stage 1 through stage 4 chronic kidney disease, or unspecified chronic kidney disease: Secondary | ICD-10-CM | POA: Diagnosis not present

## 2022-07-04 DIAGNOSIS — N390 Urinary tract infection, site not specified: Secondary | ICD-10-CM | POA: Diagnosis not present

## 2022-07-07 ENCOUNTER — Ambulatory Visit (HOSPITAL_COMMUNITY)
Admission: RE | Admit: 2022-07-07 | Discharge: 2022-07-07 | Disposition: A | Discharge status: Home / Self Care | Payer: Medicare Other | Source: Ambulatory Visit | Attending: Nurse Practitioner | Admitting: Nurse Practitioner

## 2022-07-07 ENCOUNTER — Other Ambulatory Visit: Payer: Self-pay | Admitting: Nurse Practitioner

## 2022-07-07 DIAGNOSIS — N1831 Chronic kidney disease, stage 3a: Secondary | ICD-10-CM | POA: Diagnosis not present

## 2022-07-07 DIAGNOSIS — E1122 Type 2 diabetes mellitus with diabetic chronic kidney disease: Secondary | ICD-10-CM | POA: Diagnosis not present

## 2022-07-07 DIAGNOSIS — M5126 Other intervertebral disc displacement, lumbar region: Secondary | ICD-10-CM | POA: Diagnosis not present

## 2022-07-07 DIAGNOSIS — C931 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: Secondary | ICD-10-CM | POA: Diagnosis not present

## 2022-07-07 DIAGNOSIS — M4186 Other forms of scoliosis, lumbar region: Secondary | ICD-10-CM | POA: Diagnosis not present

## 2022-07-07 DIAGNOSIS — D492 Neoplasm of unspecified behavior of bone, soft tissue, and skin: Secondary | ICD-10-CM | POA: Diagnosis not present

## 2022-07-07 DIAGNOSIS — M48061 Spinal stenosis, lumbar region without neurogenic claudication: Secondary | ICD-10-CM | POA: Diagnosis not present

## 2022-07-07 DIAGNOSIS — N390 Urinary tract infection, site not specified: Secondary | ICD-10-CM | POA: Diagnosis not present

## 2022-07-07 DIAGNOSIS — D631 Anemia in chronic kidney disease: Secondary | ICD-10-CM | POA: Diagnosis not present

## 2022-07-07 DIAGNOSIS — I130 Hypertensive heart and chronic kidney disease with heart failure and stage 1 through stage 4 chronic kidney disease, or unspecified chronic kidney disease: Secondary | ICD-10-CM | POA: Diagnosis not present

## 2022-07-07 DIAGNOSIS — I5043 Acute on chronic combined systolic (congestive) and diastolic (congestive) heart failure: Secondary | ICD-10-CM | POA: Diagnosis not present

## 2022-07-07 MED ORDER — GADOBUTROL 1 MMOL/ML IV SOLN
5.0000 mL | Freq: Once | INTRAVENOUS | Status: AC | PRN
  Administered 2022-07-07: 5 mL via INTRAVENOUS

## 2022-07-07 MED FILL — Dexamethasone Sodium Phosphate Inj 100 MG/10ML: INTRAMUSCULAR | Qty: 1 | Status: AC

## 2022-07-09 NOTE — Progress Notes (Unsigned)
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  Please see concurrent tissue biopsy (below) for additional work-up and final diagnosis.    FINAL MICROSCOPIC DIAGNOSIS:   A. SOFT TISSUE MASS, PRE SACRAL, NEEDLE CORE BIOPSY:  -  Chronic lymphocytic leukemia/small lymphocytic lymphoma  -  Extra medullary hematopoiesis  -  See comment   COMMENT:  The biopsy consists of multiple soft tissue cores with lymphoid nodules and a dense hematopoietic infiltrate consistent with extra medullary hematopoiesis.  MPO and E-cadherin highlight myeloid and erythroid precursors respectively.  CD34 highlights increased vasculature and is also positive within the cytoplasm of megakaryocytes.  A few small, immature mononuclear cells appear to be positive for CD34 and CD117. TdT shows rare, scattered positive cells.  CD20 highlights aggregates of B cells which are admixed with CD3 positive T cells.  T cells are an admixture of CD4 and CD8.  The B cells are also positive for CD5, CD23 and Bcl-2.  The B cells do  not show significant staining for CD10, BCL6 or cyclin D1.  CD138 highlights scattered plasma cells which are polytypic by kappa and lambda in situ hybridization.  Flow cytometry performed on the sample (see WL [id number]) identified a kappa restricted CD5 positive B-cell population comprising 70% of lymphocytes.  In addition, a small myeloblast population comprised 2% of the total cellular events.   Overall, the findings are consistent with soft tissue involvement by  chronic lymphocytic leukemia/small lymphocytic lymphoma and extra medullary hematopoiesis. In reviewing the patient's CBC data (macrocytic anemia and thrombocytopenia), I would recommend a bone marrow biopsy to assess for marrow involvement by CLL/SLL.    12/23/2021 Imaging   EXAM: CT CHEST, ABDOMEN, AND PELVIS WITH CONTRAST  IMPRESSION: 1. Slight interval enlargement of a presacral soft tissue mass measuring 7.2 x 4.7 cm, previously 6.9 x 4.1 cm on prior MR of the pelvis dated 07/04/2021. By report, this represents a biopsy proven lymphoma. 2. Pleural nodule of the dependent right lower lobe overlying the posterior right tenth rib and pleural or paraspinous soft tissue mass overlying the right aspect of the T10 vertebral body, very slightly enlarged compared to prior examination of the chest dated 06/25/2020, consistent with additional sites of lymphomatous involvement given very indolent growth. These could be better assessed for metabolic activity by FDG PET/CT if desired. 3. There is mild, bibasilar predominant pulmonary fibrosis in a pattern featuring irregular peripheral interstitial opacity, septal thickening, but without clear evidence of subpleural bronchiolectasis or honeycombing, with a somewhat asymmetric distribution most conspicuously involving the right lower lobe and lingula. These findings are significantly worsened when compared to prior examination dated 06/25/2020, particularly in the right lower lobe.  Given interval change, this may reflect sequelae of interval infection or aspiration, however appearance is generally suspicious for fibrotic interstitial lung disease, and if characterized by ATS pulmonary fibrosis is in an "indeterminate for UIP" pattern, differential considerations including both UIP and NSIP. 4. Emphysema.   Aortic Atherosclerosis (ICD10-I70.0) and Emphysema (ICD10-J43.9).   01/05/2022 Pathology Results   DIAGNOSIS:   BONE MARROW, ASPIRATE, CLOT, CORE:  -Hypercellular bone marrow for age with features of  myelodysplastic/myeloproliferative neoplasm  -Minor abnormal B-cell population  -See comment   PERIPHERAL BLOOD:  -Macrocytic anemia  -Neutrophilic left shift and monocytosis  -Thrombocytopenia   COMMENT:  The bone marrow is hypercellular for age with dyspoietic changes  involving myeloid cell lines associated with monocytosis and increased number of blastic cells (12%) as primarily seen by morphology, many of which display monocytic features.  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  In this background, there are several predominantly small lymphoid aggregates mostly composed of small lymphoid cells.  By flow cytometry, a minor abnormal B-cell population expressing CD5 is seen and representing 2% of all cells.  This correlate with previously known B-cell lymphoproliferative process.  Correlation with cytogenetic and FISH studies is strongly recommended.    DIAGNOSIS:   -Increased number of monocytic cells present (25%)  -Minor abnormal B-cell population identified.  -See comment   COMMENT:  Flow cytometric analysis shows increased number of monocytic cells representing 25% of all cells but without aberrant phenotype or CD34 expression.  A significant CD34-positive blastic population is  not identified.  The lymphoid population shows a minor B-cell population representing 2% of all cells and expressing B-cell antigens including CD20 associated with CD5, CD200 and possibly dim kappa expression.  The latter findings are abnormal and correlate with previously known B-cell lymphoproliferative process.  No significant T-cell phenotypic abnormalities identified.    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 Haney returns for follow up as scheduled. Last seen by me 06/12/22 and completed cycle 4 Azacitadine 6/19 - 23. She was admitted 6/26 for abdominal pain, upon arrival to ED found to be in Afib with RVR. She was started on cardizem drip. CXR showed pulmonary vascular congestion, given IV lasix. CT AP suspicious for early appendicitis but surgery ruled this out. She required blood transfusion for Hgb 6.5, thrombocytopenia worsened to 21 K. She was discharged 6/30, saw cardiology as outpatient 06/29/22 where it was felt that the rapid afib was a result of acute drop in hgb.   Ms. Frith returns with her spouse, in a wheelchair. She feels stronger and better at home.  She has mild abdominal cramping prior to BM but no significant pain like what brought her to ED.  She has bruising but no bleeding. Her weight has been stable lately, breathing has significantly improved.  Denies fever, chills, or any new specific complaints.  All other systems were reviewed with the patient and are negative.  MEDICAL HISTORY:  Past Medical History:  Diagnosis Date    Arthritis    Rheumatoid arthritis   Celiac disease    Chronic kidney disease    stage 3 from MD notes   COPD (chronic obstructive pulmonary disease) ([facility])    Dyspnea    with going up stairs   Family history of adverse reaction to anesthesia    father had hard time waking up   Headache    sinus headaches   Hot flashes    Hypertension    Iron deficiency anemia    Pneumonia    per patient "I have walking pneumonia"    SURGICAL HISTORY: Past Surgical History:  Procedure Laterality Date   COLONOSCOPY     ECTOPIC PREGNANCY SURGERY      x 2   IR IMAGING GUIDED PORT INSERTION  01/31/2022   REVERSE SHOULDER ARTHROPLASTY Right 02/02/2017   Procedure: RIGHT REVERSE SHOULDER ARTHROPLASTY;  Surgeon: Netta Cedars, MD;  Location: [facility];  Service: Orthopedics;  Laterality: Right;    I have reviewed the social history and family history with the patient and they are unchanged from previous note.  ALLERGIES:  is allergic to digoxin and related, gluten meal, penicillins, alendronate, and hydroxychloroquine.  MEDICATIONS:  Current Outpatient Medications  Medication Sig Dispense Refill   denosumab (PROLIA) 60 MG/ML SOSY injection Inject 60 mg into the skin every 6 (six) months.     dicyclomine (BENTYL) 10 MG capsule Take 1 capsule (10 mg total) by mouth 3 (three) times daily as needed for spasms. (Patient not taking: Reported on 06/29/2022) 30 capsule 0   diltiazem (CARDIZEM CD) 240 MG 24 hr capsule Take 1 capsule (240 mg total) by mouth daily. 30 capsule 1   furosemide (LASIX) 20 MG tablet Take 1 tablet (20 mg total) by mouth daily. 30 tablet 11   gabapentin (NEURONTIN) 300 MG capsule Take 300 mg by mouth in the morning.     InFLIXimab (REMICADE IV) Inject 10 mg into the vein every 2 (two) months.     Metoprolol Tartrate 75 MG TABS Take 150 mg by mouth 2 (two) times daily. 120 tablet 0   potassium chloride SA (KLOR-CON M20) 20 MEQ tablet Take 1 tablet (20 mEq total) by mouth daily. Take with  furosemide 90 tablet 3   predniSONE (DELTASONE) 5 MG tablet Take 5 mg by mouth daily with breakfast.     tamsulosin (FLOMAX) 0.4 MG CAPS capsule Take 1 capsule (0.4 mg total) by mouth daily after supper. (Patient not taking: Reported on 06/29/2022) 30 capsule 1   Tiotropium Bromide-Olodaterol (STIOLTO RESPIMAT) 2.5-2.5 MCG/ACT AERS Inhale 2 puffs into the lungs daily. 4 g 5   vitamin C (ASCORBIC ACID) 500 MG tablet Take 500 mg by mouth daily.     No current facility-administered medications for this visit.   Facility-Administered Medications Ordered in Other Visits  Medication Dose Route Frequency Provider Last Rate Last Admin   sodium chloride flush (NS) 0.9 % injection 10 mL  10 mL Intracatheter PRN Truitt Merle, MD   10 mL at 03/14/22 1709    PHYSICAL EXAMINATION: ECOG PERFORMANCE STATUS: 1-2  Vitals:   07/10/22 1008  BP: (!) 109/53  Pulse: 61  Resp: 15  Temp: 98.6 F (37 C)  SpO2: 95%   Filed Weights   07/10/22 1008  Weight: 115 lb 4.8 oz (52.3 kg)    GENERAL:alert, no distress and comfortable SKIN: Ecchymoses to bilateral forearms EYES: sclera clear LUNGS: Decreased throughout with normal breathing effort HEART: Irregular rhythm, moderate bilateral  ower extremity edema ABDOMEN:abdomen soft, non-tender and normal bowel sounds NEURO: alert & oriented x 3 with fluent speech PAC without erythema  LABORATORY DATA:  I have reviewed the data as listed    Latest Ref Rng & Units 07/10/2022    9:50 AM 06/26/2022   11:16 AM 06/23/2022    8:07 AM  CBC  WBC 4.0 - 10.5 K/uL 9.2  4.8  3.6   Hemoglobin 12.0 - 15.0 g/dL 9.6  10.0  9.8   Hematocrit 36.0 - 46.0 % 28.6  30.2  29.6   Platelets 150 - 400 K/uL 53  29  21         Latest Ref Rng & Units 07/10/2022    9:50 AM 06/26/2022   11:16 AM 06/22/2022    4:25 AM  CMP  Glucose 70 - 99 mg/dL 108  105  105   BUN 8 - 23 mg/dL 20  15  12    Creatinine 0.44 - 1.00 mg/dL 0.87  1.02  0.70   Sodium 135 - 145 mmol/L 137  137  136    Potassium 3.5 - 5.1 mmol/L 4.5  4.8  3.7   Chloride 98 - 111 mmol/L 99  103  106   CO2 22 - 32 mmol/L 32  26  24   Calcium 8.9 - 10.3 mg/dL 9.6  9.1  7.8   Total Protein 6.5 - 8.1 g/dL 5.7  5.4  4.3   Total Bilirubin 0.3 - 1.2 mg/dL 0.9  1.0  1.5   Alkaline Phos 38 - 126 U/L 49  43  35   AST 15 - 41 U/L 19  14  18    ALT 0 - 44 U/L 18  17  19        RADIOGRAPHIC STUDIES: I have personally reviewed the radiological images as listed and agreed with the findings in the report. No results found.   ASSESSMENT & PLAN: GILLIAN MEEUWSEN is a 76 y.o. female with    1. 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 SPEP and UPEP with immunofixation were negative. No lab evidence of hemolysis, erythropoietin level is normal.  -Her prior bone marrow biopsy in 2016 was negative except several small lymphoid aggregation, suspicious for low-grade B cell lymphoproliferative process, especially SLL. Her peripheral white count has been normal, no elevated lymphocytes -CT scan from 03/2015 was negative for adenopathy or splenomegaly. no B symptoms  -she has been under observation -She developed worsening back pain in Summer 2022 and underwent work up, pelvic MRI 07/04/21 showed a mass in the presacral space. -bone marrow biopsy on 01/05/22 showed hypercellular marrow with features of myelodysplastic/myeloproliferative neoplasm, increased 12% blasts, most consistent with CMML-2. Her BM biopsy was reviewed at [facility] and was felt to be CMML-1 with 5% blasts. Her previous presacral soft tissue biopsy which showed same CMML as her bone marrow biopsy. -she met Dr. Linus Orn at [facility] on 01/26/22. Vidaza was also recommended  -She began azacitadine daily for 7 days (5+2) every 28 days, starting 02/06/22. Tolerated mostly well -She was hospitalized 03/29/2022 - 04/13/2022 and went to 2 weeks of rehab.  Oncology work-up during  hospitalization showed stable chronic presacral soft tissue tumor, no other new imaging findings.  Repeat bone marrow biopsy showed persistent CMML with only 2% blasts, slightly lower than before.  There is no other evidence of disease progression in the marrow -She  resumed dose-reduced Vidaza to 5 days (q28 days) starting with cycle 3 on 5/22 -she completed 4 days and was readmitted 5/26 - 5/28 for CHF exacerbation, and again 6/26 - 6/30 -Ms. Larkin appears stable.  S/p cycle 4 6/19 - 6/23, she experienced chemo related anemia and thrombocytopenia, otherwise tolerated fairly well.  She was hospitalized again for recurrent CHF exacerbation but has recovered. -I reviewed her restaging lumbar MRI which shows chronic stable presacral soft tissue tumor, otherwise no metastatic disease. -Labs reviewed, anemia at baseline, platelets 53 K, CMP unremarkable. -Proceed with cycle 5-day 1 azacitidine today as planned, x5 days -Weekly lab to see if she needs additional transfusions -Follow-up in 2 weeks for toxicity check in 4 weeks with cycle 6   2.  Hospitalization 04/01/2022 - 04/13/2022 for dyspnea, deconditioning, and A-fib with RVR; recurrent hospitalization 5/26-28 for CHF exacerbation, repeat admission 06/19/1929 for CHF -  She was found to have pleural effusion s/p thoracentesis 03/30/2022, cytology was negative for malignant cells.  Dyspnea resolved after hospitalization.  In the past 4-5 days she has mild exertional dyspnea, okay to monitor for now.  We will repeat chest x-ray and possible Thora if she has recurrent pleural effusion - Bone marrow biopsy 04/04/2022 showed persistent CMML, decreased blasts 2%.  No disease progression in the marrow - Lumbar MRI showed stable presacral soft tissue mass.  - Heart rate controlled with high dose metoprolol, digoxin and diltiazem -She was discharged to rehab, PS improved.  Vidaza was dose reduced to 5 days with cycle 3 -She was readmitted after cycle 3-day 4 from  5/26-28 for CHF exacerbation.  Transfuse 1 unit and diuresed, symptoms improved; Home PT for now -She completed cycle 4 vidaza 6/19 - 6/23 and was readmitted 6/26- 6/30 for CHF exacerbation, A-fib in RVR, and abdominal pain.  Found to have acute on chronic cytopenias, transfused 1 unit and diuresed.  Improved symptomatically and discharged home.  Cardiology follow-up 7/6   3. Symptom management: abdominal cramps, insomnia, pancytopenia, dyspnea, weakness, low appetite  -She developed abdominal cramps during the first week of vidaza, debilitating at first when not having BMs. -She corrected constipation, cramps have improved but not resolved.  On Bentyl as needed -I recommended mirtazapine for low appetite, she previously declined. I encouraged supplements  -She has worsening cytopenias after starting treatment, she receives irradiated blood products as needed  -Due to pancytopenia and deconditioning cycle 3 Vidaza was postponed but she was subsequently hospitalized 03/29/22 - 04/13/22 -Vidaza was reduced from 7 days monthly to 5 days monthly with cycle 3  4. COPD, AF, HTN -on resp meds and eliquis; continue per PCP, cardiology, and pulmonology  - she recently changed inhalers from stiolto to breztri, her HR and dyspnea issues worsened since then.  Switched back to [employer] in 03/2022 -Due to thrombocytopenia, Eliquis has been on hold -Continue PCP, cardiology, and pulmonology follow-up   5. Rheumatoid arthritis -on Remicade q8 weeks per rheumatologist Dr. Trudie Reed. We reviewed the risk of lymphoma and immunosupression on Remicade -I previously cc'd my note to Dr. Trudie Reed to consider an alternative given the new diagnosis  -she continues remidcade -She takes vitamin supplements and gets prolia injection q62month with her PCP.   PLAN: -[facility] and today's labs reviewed -Proceed with cycle 5 azacitidine daily x5 days -Weekly lab -Follow-up in 2 weeks for toxicity check in 4 weeks with next  cycle -Plan discussed with Dr. FBurr Medico  All questions were answered. The patient knows to call the clinic  with any problems, questions or concerns. No barriers to learning was detected. I spent 20 minutes counseling the patient face to face. The total time spent in the appointment was 30 minutes and more than 50% was on counseling and review of test results.     Alla Feeling, NP 07/10/22

## 2022-07-10 ENCOUNTER — Inpatient Hospital Stay: Payer: Medicare Other

## 2022-07-10 ENCOUNTER — Encounter: Payer: Self-pay | Admitting: Nurse Practitioner

## 2022-07-10 ENCOUNTER — Inpatient Hospital Stay (HOSPITAL_BASED_OUTPATIENT_CLINIC_OR_DEPARTMENT_OTHER): Payer: Medicare Other | Admitting: Nurse Practitioner

## 2022-07-10 ENCOUNTER — Other Ambulatory Visit: Payer: Self-pay

## 2022-07-10 VITALS — BP 109/53 | HR 61 | Temp 98.6°F | Resp 15 | Ht 63.0 in | Wt 115.3 lb

## 2022-07-10 DIAGNOSIS — Z95828 Presence of other vascular implants and grafts: Secondary | ICD-10-CM

## 2022-07-10 DIAGNOSIS — Z5111 Encounter for antineoplastic chemotherapy: Secondary | ICD-10-CM | POA: Diagnosis not present

## 2022-07-10 DIAGNOSIS — C931 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: Secondary | ICD-10-CM | POA: Diagnosis not present

## 2022-07-10 DIAGNOSIS — Z79899 Other long term (current) drug therapy: Secondary | ICD-10-CM | POA: Diagnosis not present

## 2022-07-10 LAB — CBC WITH DIFFERENTIAL/PLATELET
Abs Immature Granulocytes: 0.11 10*3/uL — ABNORMAL HIGH (ref 0.00–0.07)
Basophils Absolute: 0.1 10*3/uL (ref 0.0–0.1)
Basophils Relative: 1 %
Eosinophils Absolute: 0.1 10*3/uL (ref 0.0–0.5)
Eosinophils Relative: 1 %
HCT: 28.6 % — ABNORMAL LOW (ref 36.0–46.0)
Hemoglobin: 9.6 g/dL — ABNORMAL LOW (ref 12.0–15.0)
Immature Granulocytes: 1 %
Lymphocytes Relative: 16 %
Lymphs Abs: 1.5 10*3/uL (ref 0.7–4.0)
MCH: 34.4 pg — ABNORMAL HIGH (ref 26.0–34.0)
MCHC: 33.6 g/dL (ref 30.0–36.0)
MCV: 102.5 fL — ABNORMAL HIGH (ref 80.0–100.0)
Monocytes Absolute: 1.9 10*3/uL — ABNORMAL HIGH (ref 0.1–1.0)
Monocytes Relative: 20 %
Neutro Abs: 5.6 10*3/uL (ref 1.7–7.7)
Neutrophils Relative %: 61 %
Platelets: 53 10*3/uL — ABNORMAL LOW (ref 150–400)
RBC: 2.79 MIL/uL — ABNORMAL LOW (ref 3.87–5.11)
RDW: 18.6 % — ABNORMAL HIGH (ref 11.5–15.5)
WBC: 9.2 10*3/uL (ref 4.0–10.5)
nRBC: 0.2 % (ref 0.0–0.2)

## 2022-07-10 LAB — CMP (CANCER CENTER ONLY)
ALT: 18 U/L (ref 0–44)
AST: 19 U/L (ref 15–41)
Albumin: 3.8 g/dL (ref 3.5–5.0)
Alkaline Phosphatase: 49 U/L (ref 38–126)
Anion gap: 6 (ref 5–15)
BUN: 20 mg/dL (ref 8–23)
CO2: 32 mmol/L (ref 22–32)
Calcium: 9.6 mg/dL (ref 8.9–10.3)
Chloride: 99 mmol/L (ref 98–111)
Creatinine: 0.87 mg/dL (ref 0.44–1.00)
GFR, Estimated: >60 mL/min (ref >60)
Glucose, Bld: 108 mg/dL — ABNORMAL HIGH (ref 70–99)
Potassium: 4.5 mmol/L (ref 3.5–5.1)
Sodium: 137 mmol/L (ref 135–145)
Total Bilirubin: 0.9 mg/dL (ref 0.3–1.2)
Total Protein: 5.7 g/dL — ABNORMAL LOW (ref 6.5–8.1)

## 2022-07-10 MED ORDER — HEPARIN SOD (PORK) LOCK FLUSH 100 UNIT/ML IV SOLN
500.0000 [IU] | Freq: Once | INTRAVENOUS | Status: AC | PRN
  Administered 2022-07-10: 500 [IU]

## 2022-07-10 MED ORDER — SODIUM CHLORIDE 0.9% FLUSH
10.0000 mL | INTRAVENOUS | Status: DC | PRN
  Administered 2022-07-10: 10 mL

## 2022-07-10 MED ORDER — PALONOSETRON HCL INJECTION 0.25 MG/5ML
0.2500 mg | Freq: Once | INTRAVENOUS | Status: AC
  Administered 2022-07-10: 0.25 mg via INTRAVENOUS
  Filled 2022-07-10: qty 5

## 2022-07-10 MED ORDER — SODIUM CHLORIDE 0.9% FLUSH
10.0000 mL | Freq: Once | INTRAVENOUS | Status: AC
  Administered 2022-07-10: 10 mL

## 2022-07-10 MED ORDER — SODIUM CHLORIDE 0.9 % IV SOLN: Freq: Once | INTRAVENOUS | Status: AC

## 2022-07-10 MED ORDER — SODIUM CHLORIDE 0.9 % IV SOLN
10.0000 mg | Freq: Once | INTRAVENOUS | Status: AC
  Administered 2022-07-10: 10 mg via INTRAVENOUS
  Filled 2022-07-10: qty 10

## 2022-07-10 MED ORDER — SODIUM CHLORIDE 0.9 % IV SOLN
75.0000 mg/m2 | Freq: Once | INTRAVENOUS | Status: AC
  Administered 2022-07-10: 120 mg via INTRAVENOUS
  Filled 2022-07-10: qty 12

## 2022-07-10 MED FILL — Dexamethasone Sodium Phosphate Inj 100 MG/10ML: INTRAMUSCULAR | Qty: 1 | Status: AC

## 2022-07-10 NOTE — Progress Notes (Signed)
Per treatment plan parameters, ok to treat with PLT of 53 K/uL today

## 2022-07-10 NOTE — Patient Instructions (Signed)
[facility]COL[facility]  Discharge Instructions: Thank you for choosing [facility] to provide your oncology and hematology care.   If you have a lab appointment with the [facility], please go directly to the [facility] and check in at the registration area.   Wear comfortable clothing and clothing appropriate for easy access to any Portacath or PICC line.   We strive to give you quality time with your provider. You may need to reschedule your appointment if you arrive late (15 or more minutes).  Arriving late affects you and other patients whose appointments are after yours.  Also, if you miss three or more appointments without notifying the office, you may be dismissed from the clinic at the provider's discretion.      For prescription refill requests, have your pharmacy contact our office and allow 72 hours for refills to be completed.    Today you received the following chemotherapy and/or immunotherapy agent: Vidaza      To help prevent nausea and vomiting after your treatment, we encourage you to take your nausea medication as directed.  BELOW ARE SYMPTOMS THAT SHOULD BE REPORTED IMMEDIATELY: *FEVER GREATER THAN 100.4 F (38 C) OR HIGHER *CHILLS OR SWEATING *NAUSEA AND VOMITING THAT IS NOT CONTROLLED WITH YOUR NAUSEA MEDICATION *UNUSUAL SHORTNESS OF BREATH *UNUSUAL BRUISING OR BLEEDING *URINARY PROBLEMS (pain or burning when urinating, or frequent urination) *BOWEL PROBLEMS (unusual diarrhea, constipation, pain near the anus) TENDERNESS IN MOUTH AND THROAT WITH OR WITHOUT PRESENCE OF ULCERS (sore throat, sores in mouth, or a toothache) UNUSUAL RASH, SWELLING OR PAIN  UNUSUAL VAGINAL DISCHARGE OR ITCHING   Items with * indicate a potential emergency and should be followed up as soon as possible or go to the Emergency Department if any problems should occur.  Please show the CHEMOTHERAPY ALERT CARD or IMMUNOTHERAPY ALERT CARD at check-in to the  Emergency Department and triage nurse.  Should you have questions after your visit or need to cancel or reschedule your appointment, please contact [facility]  Dept: [phone]  and follow the prompts.  Office hours are 8:00 a.m. to 4:30 p.m. Monday - Friday. Please note that voicemails left after 4:00 p.m. may not be returned until the following business day.  We are closed weekends and major holidays. You have access to a nurse at all times for urgent questions. Please call the main number to the clinic Dept: [phone] and follow the prompts.   For any non-urgent questions, you may also contact your provider using MyChart. We now offer e-Visits for anyone 9 and older to request care online for non-urgent symptoms. For details visit [email or web address].   Also download the MyChart app! Go to the app store, search "MyChart", open the app, select [facility], and log in with your MyChart username and password.  Masks are optional in the cancer centers. If you would like for your care team to wear a mask while they are taking care of you, please let them know. For doctor visits, patients may have with them one support person who is at least 76 years old. At this time, visitors are not allowed in the infusion area.

## 2022-07-11 ENCOUNTER — Inpatient Hospital Stay: Payer: Medicare Other | Admitting: Dietician

## 2022-07-11 ENCOUNTER — Telehealth: Payer: Self-pay | Admitting: Hematology

## 2022-07-11 ENCOUNTER — Inpatient Hospital Stay: Payer: Medicare Other

## 2022-07-11 VITALS — BP 96/64 | HR 60 | Temp 98.3°F | Resp 17

## 2022-07-11 DIAGNOSIS — N390 Urinary tract infection, site not specified: Secondary | ICD-10-CM | POA: Diagnosis not present

## 2022-07-11 DIAGNOSIS — E1122 Type 2 diabetes mellitus with diabetic chronic kidney disease: Secondary | ICD-10-CM | POA: Diagnosis not present

## 2022-07-11 DIAGNOSIS — Z79899 Other long term (current) drug therapy: Secondary | ICD-10-CM | POA: Diagnosis not present

## 2022-07-11 DIAGNOSIS — D631 Anemia in chronic kidney disease: Secondary | ICD-10-CM | POA: Diagnosis not present

## 2022-07-11 DIAGNOSIS — Z5111 Encounter for antineoplastic chemotherapy: Secondary | ICD-10-CM | POA: Diagnosis not present

## 2022-07-11 DIAGNOSIS — N1831 Chronic kidney disease, stage 3a: Secondary | ICD-10-CM | POA: Diagnosis not present

## 2022-07-11 DIAGNOSIS — C931 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: Secondary | ICD-10-CM | POA: Diagnosis not present

## 2022-07-11 DIAGNOSIS — I130 Hypertensive heart and chronic kidney disease with heart failure and stage 1 through stage 4 chronic kidney disease, or unspecified chronic kidney disease: Secondary | ICD-10-CM | POA: Diagnosis not present

## 2022-07-11 DIAGNOSIS — I5043 Acute on chronic combined systolic (congestive) and diastolic (congestive) heart failure: Secondary | ICD-10-CM | POA: Diagnosis not present

## 2022-07-11 MED ORDER — SODIUM CHLORIDE 0.9 % IV SOLN
10.0000 mg | Freq: Once | INTRAVENOUS | Status: AC
  Administered 2022-07-11: 10 mg via INTRAVENOUS
  Filled 2022-07-11: qty 10

## 2022-07-11 MED ORDER — SODIUM CHLORIDE 0.9 % IV SOLN: Freq: Once | INTRAVENOUS | Status: AC

## 2022-07-11 MED ORDER — HEPARIN SOD (PORK) LOCK FLUSH 100 UNIT/ML IV SOLN
500.0000 [IU] | Freq: Once | INTRAVENOUS | Status: AC | PRN
  Administered 2022-07-11: 500 [IU]

## 2022-07-11 MED ORDER — SODIUM CHLORIDE 0.9 % IV SOLN
75.0000 mg/m2 | Freq: Once | INTRAVENOUS | Status: AC
  Administered 2022-07-11: 120 mg via INTRAVENOUS
  Filled 2022-07-11: qty 12

## 2022-07-11 MED ORDER — SODIUM CHLORIDE 0.9% FLUSH
10.0000 mL | INTRAVENOUS | Status: DC | PRN
  Administered 2022-07-11: 10 mL

## 2022-07-11 MED FILL — Dexamethasone Sodium Phosphate Inj 100 MG/10ML: INTRAMUSCULAR | Qty: 1 | Status: AC

## 2022-07-11 NOTE — Telephone Encounter (Signed)
Scheduled follow-up appointments per 7/17 los. Patient is aware.

## 2022-07-11 NOTE — Progress Notes (Signed)
Nutrition Follow-up:  Patient with CMML. She is receiving azacitidine q28d.   Noted 6/26-6/30 hospitalization with abdominal pain, Afib with RVR  Met with patient in infusion. She reports appetite is not as good as it used to be, but trying to eat better. Patient reports difficulties enjoying food with dietary restrictions including low sodium and gluten free. Patient states she used to live to eat and now she eats to live. Her best meal is breakfast. She has 2 fruits, a carb, and a protein. Patient normally does not eat lunch. Recalls lots of fish, shrimp, chicken for dinner. She is tired of chicken. Patient is drinking one Ensure. Patient denies nausea, vomiting, diarrhea, constipation.   Medications: reviewed  Labs: reviewed   Anthropometrics: Weight 115 lb 4.8 oz on 7/17 decreased   7/6 - 118 lb 3.2 oz  6/23 - 119 lb 12.8 oz 6/08 112 lb 3.2 oz   NUTRITION DIAGNOSIS: Inadequate oral intake ongoing    INTERVENTION:  Encouraged high calorie high protein snacks in between meals and at bedtime - handout with ideas provided Recommend drinking 2 Ensure Plus/equivalent to promote weight gain Contact information provided     MONITORING, EVALUATION, GOAL: weight trends, intake    NEXT VISIT: Monday August 14 during infusion with Pamala Hurry

## 2022-07-12 ENCOUNTER — Encounter: Payer: Self-pay | Admitting: Nurse Practitioner

## 2022-07-12 ENCOUNTER — Inpatient Hospital Stay: Payer: Medicare Other

## 2022-07-12 ENCOUNTER — Other Ambulatory Visit: Payer: Self-pay

## 2022-07-12 VITALS — BP 119/85 | HR 63 | Temp 98.1°F | Resp 18 | Wt 118.0 lb

## 2022-07-12 DIAGNOSIS — Z79899 Other long term (current) drug therapy: Secondary | ICD-10-CM | POA: Diagnosis not present

## 2022-07-12 DIAGNOSIS — C931 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: Secondary | ICD-10-CM

## 2022-07-12 DIAGNOSIS — Z5111 Encounter for antineoplastic chemotherapy: Secondary | ICD-10-CM | POA: Diagnosis not present

## 2022-07-12 MED ORDER — SODIUM CHLORIDE 0.9% FLUSH
10.0000 mL | INTRAVENOUS | Status: DC | PRN
  Administered 2022-07-12: 10 mL

## 2022-07-12 MED ORDER — SODIUM CHLORIDE 0.9 % IV SOLN
10.0000 mg | Freq: Once | INTRAVENOUS | Status: AC
  Administered 2022-07-12: 10 mg via INTRAVENOUS
  Filled 2022-07-12: qty 10

## 2022-07-12 MED ORDER — SODIUM CHLORIDE 0.9 % IV SOLN
75.0000 mg/m2 | Freq: Once | INTRAVENOUS | Status: AC
  Administered 2022-07-12: 120 mg via INTRAVENOUS
  Filled 2022-07-12: qty 12

## 2022-07-12 MED ORDER — PALONOSETRON HCL INJECTION 0.25 MG/5ML
0.2500 mg | Freq: Once | INTRAVENOUS | Status: AC
  Administered 2022-07-12: 0.25 mg via INTRAVENOUS
  Filled 2022-07-12: qty 5

## 2022-07-12 MED ORDER — SODIUM CHLORIDE 0.9 % IV SOLN: Freq: Once | INTRAVENOUS | Status: AC

## 2022-07-12 MED ORDER — HEPARIN SOD (PORK) LOCK FLUSH 100 UNIT/ML IV SOLN
500.0000 [IU] | Freq: Once | INTRAVENOUS | Status: AC | PRN
  Administered 2022-07-12: 500 [IU]

## 2022-07-12 MED FILL — Dexamethasone Sodium Phosphate Inj 100 MG/10ML: INTRAMUSCULAR | Qty: 1 | Status: AC

## 2022-07-12 NOTE — Progress Notes (Signed)
**  Per Lacie NP, take pt weight during each treatment day this week**  Pt arrived for tx today c/o increased SOB, increased weakness and increased fatigue. Weight was 118 lbs, 3 pounds higher than weight taken on 7/17. Burr Medico MD notified. Lacie NP came to bedside to assess pt. NP advised pt to increase home Lasix and Potassium and notified Cardiologist of changes. Pt expressed understanding. Per NP, ok to treat today despite symptoms. VSS and no additional complaints at time of discharge.

## 2022-07-12 NOTE — Progress Notes (Unsigned)
[name] was seen and examined in the infusion room prior to [place], last seen by me on Monday 7/17. She expressed a stressful encounter with cable company, being busy at home, and worrying about spouse's macular degeneration and likely need for home care soon. She can ambulate in the house without significant dyspnea. She is wrapping her legs and taking routine meds. She has gained 3 lbs in 2 days. Can't tell when in Afib.   Exam shows her sitting up awake/alert in no distress. No dyspnea or tachypnea. Lung sounds clear. HR 98 apical, in Afib, 98% on RA, legs 3+ edema bilaterally.  I recommend to proceed with vidaza today. Increase lasix and KCL to BID x3 days. Daily weights at [facility]. I will cc note to cardiology.   She knows to go to ED if her condition acutely changes overnight/after hours.   Will follow her closely. No other needs at this time.   Cira Rue, NP

## 2022-07-13 ENCOUNTER — Inpatient Hospital Stay: Payer: Medicare Other

## 2022-07-13 VITALS — BP 109/69 | HR 75 | Temp 97.7°F | Resp 18 | Wt 122.5 lb

## 2022-07-13 DIAGNOSIS — C931 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: Secondary | ICD-10-CM | POA: Diagnosis not present

## 2022-07-13 DIAGNOSIS — Z79899 Other long term (current) drug therapy: Secondary | ICD-10-CM | POA: Diagnosis not present

## 2022-07-13 DIAGNOSIS — Z5111 Encounter for antineoplastic chemotherapy: Secondary | ICD-10-CM | POA: Diagnosis not present

## 2022-07-13 MED ORDER — SODIUM CHLORIDE 0.9 % IV SOLN
75.0000 mg/m2 | Freq: Once | INTRAVENOUS | Status: AC
  Administered 2022-07-13: 120 mg via INTRAVENOUS
  Filled 2022-07-13: qty 12

## 2022-07-13 MED ORDER — HEPARIN SOD (PORK) LOCK FLUSH 100 UNIT/ML IV SOLN: 500.0000 [IU] | Freq: Once | INTRAVENOUS | Status: DC | PRN

## 2022-07-13 MED ORDER — SODIUM CHLORIDE 0.9 % IV SOLN: Freq: Once | INTRAVENOUS | Status: AC

## 2022-07-13 MED ORDER — SODIUM CHLORIDE 0.9 % IV SOLN
10.0000 mg | Freq: Once | INTRAVENOUS | Status: AC
  Administered 2022-07-13: 10 mg via INTRAVENOUS
  Filled 2022-07-13: qty 10

## 2022-07-13 MED ORDER — SODIUM CHLORIDE 0.9% FLUSH: 10.0000 mL | INTRAVENOUS | Status: DC | PRN

## 2022-07-13 MED ORDER — ONDANSETRON HCL 8 MG PO TABS
8.0000 mg | ORAL_TABLET | Freq: Once | ORAL | Status: AC
  Administered 2022-07-13: 8 mg via ORAL
  Filled 2022-07-13: qty 1

## 2022-07-13 MED FILL — Dexamethasone Sodium Phosphate Inj 100 MG/10ML: INTRAMUSCULAR | Qty: 1 | Status: AC

## 2022-07-14 ENCOUNTER — Inpatient Hospital Stay: Payer: Medicare Other

## 2022-07-14 VITALS — BP 107/75 | HR 89 | Temp 97.9°F | Resp 17 | Wt 114.8 lb

## 2022-07-14 DIAGNOSIS — C931 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: Secondary | ICD-10-CM

## 2022-07-14 DIAGNOSIS — Z79899 Other long term (current) drug therapy: Secondary | ICD-10-CM | POA: Diagnosis not present

## 2022-07-14 DIAGNOSIS — Z5111 Encounter for antineoplastic chemotherapy: Secondary | ICD-10-CM | POA: Diagnosis not present

## 2022-07-14 MED ORDER — SODIUM CHLORIDE 0.9% FLUSH
10.0000 mL | INTRAVENOUS | Status: DC | PRN
  Administered 2022-07-14: 10 mL

## 2022-07-14 MED ORDER — SODIUM CHLORIDE 0.9 % IV SOLN: Freq: Once | INTRAVENOUS | Status: AC

## 2022-07-14 MED ORDER — SODIUM CHLORIDE 0.9 % IV SOLN
10.0000 mg | Freq: Once | INTRAVENOUS | Status: AC
  Administered 2022-07-14: 10 mg via INTRAVENOUS
  Filled 2022-07-14: qty 10

## 2022-07-14 MED ORDER — PALONOSETRON HCL INJECTION 0.25 MG/5ML
0.2500 mg | Freq: Once | INTRAVENOUS | Status: AC
  Administered 2022-07-14: 0.25 mg via INTRAVENOUS
  Filled 2022-07-14: qty 5

## 2022-07-14 MED ORDER — HEPARIN SOD (PORK) LOCK FLUSH 100 UNIT/ML IV SOLN
500.0000 [IU] | Freq: Once | INTRAVENOUS | Status: AC | PRN
  Administered 2022-07-14: 500 [IU]

## 2022-07-14 MED ORDER — SODIUM CHLORIDE 0.9 % IV SOLN
75.0000 mg/m2 | Freq: Once | INTRAVENOUS | Status: AC
  Administered 2022-07-14: 120 mg via INTRAVENOUS
  Filled 2022-07-14: qty 12

## 2022-07-14 NOTE — Patient Instructions (Signed)
[facility]  Discharge Instructions: Thank you for choosing [facility] to provide your oncology and hematology care.   If you have a lab appointment with the [facility], please go directly to the [facility] and check in at the registration area.   Wear comfortable clothing and clothing appropriate for easy access to any Portacath or PICC line.   We strive to give you quality time with your provider. You may need to reschedule your appointment if you arrive late (15 or more minutes).  Arriving late affects you and other patients whose appointments are after yours.  Also, if you miss three or more appointments without notifying the office, you may be dismissed from the clinic at the provider's discretion.      For prescription refill requests, have your pharmacy contact our office and allow 72 hours for refills to be completed.    Today you received the following chemotherapy and/or immunotherapy agent: Vidaza      To help prevent nausea and vomiting after your treatment, we encourage you to take your nausea medication as directed.  BELOW ARE SYMPTOMS THAT SHOULD BE REPORTED IMMEDIATELY: *FEVER GREATER THAN 100.4 F (38 C) OR HIGHER *CHILLS OR SWEATING *NAUSEA AND VOMITING THAT IS NOT CONTROLLED WITH YOUR NAUSEA MEDICATION *UNUSUAL SHORTNESS OF BREATH *UNUSUAL BRUISING OR BLEEDING *URINARY PROBLEMS (pain or burning when urinating, or frequent urination) *BOWEL PROBLEMS (unusual diarrhea, constipation, pain near the anus) TENDERNESS IN MOUTH AND THROAT WITH OR WITHOUT PRESENCE OF ULCERS (sore throat, sores in mouth, or a toothache) UNUSUAL RASH, SWELLING OR PAIN  UNUSUAL VAGINAL DISCHARGE OR ITCHING   Items with * indicate a potential emergency and should be followed up as soon as possible or go to the Emergency Department if any problems should occur.  Please show the CHEMOTHERAPY ALERT CARD or IMMUNOTHERAPY ALERT CARD at check-in to the  Emergency Department and triage nurse.  Should you have questions after your visit or need to cancel or reschedule your appointment, please contact [facility]  Dept: [phone]  and follow the prompts.  Office hours are 8:00 a.m. to 4:30 p.m. Monday - Friday. Please note that voicemails left after 4:00 p.m. may not be returned until the following business day.  We are closed weekends and major holidays. You have access to a nurse at all times for urgent questions. Please call the main number to the clinic Dept: [phone] and follow the prompts.   For any non-urgent questions, you may also contact your provider using MyChart. We now offer e-Visits for anyone 64 and older to request care online for non-urgent symptoms. For details visit [email or web address].   Also download the MyChart app! Go to the app store, search "MyChart", open the app, select [facility], and log in with your MyChart username and password.  Masks are optional in the cancer centers. If you would like for your care team to wear a mask while they are taking care of you, please let them know. For doctor visits, patients may have with them one support Mozell Hardacre who is at least 76 years old. At this time, visitors are not allowed in the infusion area.

## 2022-07-17 ENCOUNTER — Inpatient Hospital Stay: Payer: Medicare Other

## 2022-07-17 ENCOUNTER — Other Ambulatory Visit: Payer: Self-pay

## 2022-07-17 VITALS — BP 120/61 | HR 93 | Temp 98.0°F | Resp 17

## 2022-07-17 DIAGNOSIS — C931 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: Secondary | ICD-10-CM | POA: Diagnosis not present

## 2022-07-17 DIAGNOSIS — Z5111 Encounter for antineoplastic chemotherapy: Secondary | ICD-10-CM | POA: Diagnosis not present

## 2022-07-17 DIAGNOSIS — Z79899 Other long term (current) drug therapy: Secondary | ICD-10-CM | POA: Diagnosis not present

## 2022-07-17 DIAGNOSIS — Z95828 Presence of other vascular implants and grafts: Secondary | ICD-10-CM

## 2022-07-17 LAB — CBC WITH DIFFERENTIAL/PLATELET
Abs Immature Granulocytes: 0.07 10*3/uL (ref 0.00–0.07)
Basophils Absolute: 0 10*3/uL (ref 0.0–0.1)
Basophils Relative: 0 %
Eosinophils Absolute: 0 10*3/uL (ref 0.0–0.5)
Eosinophils Relative: 0 %
HCT: 29.7 % — ABNORMAL LOW (ref 36.0–46.0)
Hemoglobin: 10.1 g/dL — ABNORMAL LOW (ref 12.0–15.0)
Immature Granulocytes: 1 %
Lymphocytes Relative: 10 %
Lymphs Abs: 1.2 10*3/uL (ref 0.7–4.0)
MCH: 34.5 pg — ABNORMAL HIGH (ref 26.0–34.0)
MCHC: 34 g/dL (ref 30.0–36.0)
MCV: 101.4 fL — ABNORMAL HIGH (ref 80.0–100.0)
Monocytes Absolute: 1.7 10*3/uL — ABNORMAL HIGH (ref 0.1–1.0)
Monocytes Relative: 15 %
Neutro Abs: 8.9 10*3/uL — ABNORMAL HIGH (ref 1.7–7.7)
Neutrophils Relative %: 74 %
Platelets: 69 10*3/uL — ABNORMAL LOW (ref 150–400)
RBC: 2.93 MIL/uL — ABNORMAL LOW (ref 3.87–5.11)
RDW: 17.2 % — ABNORMAL HIGH (ref 11.5–15.5)
WBC: 11.9 10*3/uL — ABNORMAL HIGH (ref 4.0–10.5)
nRBC: 0 % (ref 0.0–0.2)

## 2022-07-17 LAB — CMP (CANCER CENTER ONLY)
ALT: 20 U/L (ref 0–44)
AST: 14 U/L — ABNORMAL LOW (ref 15–41)
Albumin: 3.8 g/dL (ref 3.5–5.0)
Alkaline Phosphatase: 47 U/L (ref 38–126)
Anion gap: 8 (ref 5–15)
BUN: 32 mg/dL — ABNORMAL HIGH (ref 8–23)
CO2: 31 mmol/L (ref 22–32)
Calcium: 9.4 mg/dL (ref 8.9–10.3)
Chloride: 96 mmol/L — ABNORMAL LOW (ref 98–111)
Creatinine: 0.95 mg/dL (ref 0.44–1.00)
GFR, Estimated: >60 mL/min (ref >60)
Glucose, Bld: 130 mg/dL — ABNORMAL HIGH (ref 70–99)
Potassium: 4.1 mmol/L (ref 3.5–5.1)
Sodium: 135 mmol/L (ref 135–145)
Total Bilirubin: 1.4 mg/dL — ABNORMAL HIGH (ref 0.3–1.2)
Total Protein: 5.7 g/dL — ABNORMAL LOW (ref 6.5–8.1)

## 2022-07-17 MED ORDER — HEPARIN SOD (PORK) LOCK FLUSH 100 UNIT/ML IV SOLN
500.0000 [IU] | Freq: Once | INTRAVENOUS | Status: AC
  Administered 2022-07-17: 500 [IU] via INTRAVENOUS

## 2022-07-17 MED ORDER — SODIUM CHLORIDE 0.9% FLUSH
10.0000 mL | Freq: Once | INTRAVENOUS | Status: AC
  Administered 2022-07-17: 10 mL

## 2022-07-18 DIAGNOSIS — N390 Urinary tract infection, site not specified: Secondary | ICD-10-CM | POA: Diagnosis not present

## 2022-07-18 DIAGNOSIS — D631 Anemia in chronic kidney disease: Secondary | ICD-10-CM | POA: Diagnosis not present

## 2022-07-18 DIAGNOSIS — I130 Hypertensive heart and chronic kidney disease with heart failure and stage 1 through stage 4 chronic kidney disease, or unspecified chronic kidney disease: Secondary | ICD-10-CM | POA: Diagnosis not present

## 2022-07-18 DIAGNOSIS — I5043 Acute on chronic combined systolic (congestive) and diastolic (congestive) heart failure: Secondary | ICD-10-CM | POA: Diagnosis not present

## 2022-07-18 DIAGNOSIS — N1831 Chronic kidney disease, stage 3a: Secondary | ICD-10-CM | POA: Diagnosis not present

## 2022-07-18 DIAGNOSIS — E1122 Type 2 diabetes mellitus with diabetic chronic kidney disease: Secondary | ICD-10-CM | POA: Diagnosis not present

## 2022-07-20 ENCOUNTER — Encounter: Payer: Self-pay | Admitting: Nurse Practitioner

## 2022-07-23 NOTE — Progress Notes (Unsigned)
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  Please see concurrent tissue biopsy (below) for additional work-up and final diagnosis.    FINAL MICROSCOPIC DIAGNOSIS:   A. SOFT TISSUE MASS, PRE SACRAL, NEEDLE CORE BIOPSY:  -  Chronic lymphocytic leukemia/small lymphocytic lymphoma  -  Extra medullary hematopoiesis  -  See comment   COMMENT:  The biopsy consists of multiple soft tissue cores with lymphoid nodules and a dense hematopoietic infiltrate consistent with extra medullary hematopoiesis.  MPO and E-cadherin highlight myeloid and erythroid precursors respectively.  CD34 highlights increased vasculature and is also positive within the cytoplasm of megakaryocytes.  A few small, immature mononuclear cells appear to be positive for CD34 and CD117. TdT shows rare, scattered positive cells.  CD20 highlights aggregates of B cells which are admixed with CD3 positive T cells.  T cells are an admixture of CD4 and CD8.  The B cells are also positive for CD5, CD23 and Bcl-2.  The B cells do not show significant  staining for CD10, BCL6 or cyclin D1.  CD138 highlights scattered plasma cells which are polytypic by kappa and lambda in situ hybridization.  Flow cytometry performed on the sample (see WL [id number]) identified a kappa restricted CD5 positive B-cell population comprising 70% of lymphocytes.  In addition, a small myeloblast population comprised 2% of the total cellular events.   Overall, the findings are consistent with soft tissue involvement by  chronic lymphocytic leukemia/small lymphocytic lymphoma and extra medullary hematopoiesis. In reviewing the patient's CBC data (macrocytic anemia and thrombocytopenia), I would recommend a bone marrow biopsy to assess for marrow involvement by CLL/SLL.    12/23/2021 Imaging   EXAM: CT CHEST, ABDOMEN, AND PELVIS WITH CONTRAST  IMPRESSION: 1. Slight interval enlargement of a presacral soft tissue mass measuring 7.2 x 4.7 cm, previously 6.9 x 4.1 cm on prior MR of the pelvis dated 07/04/2021. By report, this represents a biopsy proven lymphoma. 2. Pleural nodule of the dependent right lower lobe overlying the posterior right tenth rib and pleural or paraspinous soft tissue mass overlying the right aspect of the T10 vertebral body, very slightly enlarged compared to prior examination of the chest dated 06/25/2020, consistent with additional sites of lymphomatous involvement given very indolent growth. These could be better assessed for metabolic activity by FDG PET/CT if desired. 3. There is mild, bibasilar predominant pulmonary fibrosis in a pattern featuring irregular peripheral interstitial opacity, septal thickening, but without clear evidence of subpleural bronchiolectasis or honeycombing, with a somewhat asymmetric distribution most conspicuously involving the right lower lobe and lingula. These findings are significantly worsened when compared to prior examination dated 06/25/2020, particularly in the right lower lobe. Given  interval change,  this may reflect sequelae of interval infection or aspiration, however appearance is generally suspicious for fibrotic interstitial lung disease, and if characterized by ATS pulmonary fibrosis is in an "indeterminate for UIP" pattern, differential considerations including both UIP and NSIP. 4. Emphysema.   Aortic Atherosclerosis (ICD10-I70.0) and Emphysema (ICD10-J43.9).   01/05/2022 Pathology Results   DIAGNOSIS:   BONE MARROW, ASPIRATE, CLOT, CORE:  -Hypercellular bone marrow for age with features of  myelodysplastic/myeloproliferative neoplasm  -Minor abnormal B-cell population  -See comment   PERIPHERAL BLOOD:  -Macrocytic anemia  -Neutrophilic left shift and monocytosis  -Thrombocytopenia   COMMENT:  The bone marrow is hypercellular for age with dyspoietic changes  involving myeloid cell lines associated with monocytosis and increased number of blastic cells (12%) as primarily seen by morphology, many of which display monocytic features.  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  In this background, there are several predominantly small lymphoid aggregates mostly composed of small lymphoid cells.  By flow cytometry, a minor abnormal B-cell population expressing CD5 is seen and representing 2% of all cells.  This correlate with previously known B-cell lymphoproliferative process.  Correlation with cytogenetic and FISH studies is strongly recommended.    DIAGNOSIS:   -Increased number of monocytic cells present (25%)  -Minor abnormal B-cell population identified.  -See comment   COMMENT:  Flow cytometric analysis shows increased number of monocytic cells representing 25% of all cells but without aberrant phenotype or CD34 expression.  A significant CD34-positive blastic population is not identified.  The  lymphoid population shows a minor B-cell population representing 2% of all cells and expressing B-cell antigens including CD20 associated with CD5, CD200 and possibly dim kappa expression.  The latter findings are abnormal and correlate with previously known B-cell lymphoproliferative process.  No significant T-cell phenotypic abnormalities identified.    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 Legler returns for follow up. Last seen by me 7/17 after recent hospitalization. She had recovered and began cycle 5 vidaza which she completed 7/21. She had issues with fatigue, mild shortness of breath, weight gain, and leg edema during the 5 days of treatment. Lasix was increased to BID x3 days and her weight returned to baseline. Today she feels well, back to lasix daily. Her breathing is "not an issue."  She and her husband are concerned because she ran out of metoprolol, today is her first day out of the medication, and they are worried she may go into another A-fib exacerbation.  She cannot tell when in A-fib with RVR. Leg edema appears slightly improved.  She has a large bruise on the front of the right leg, denies bleeding.  Bowels moving.  Her weight is down she attributes to lack of taste.  Denies nausea/vomiting, fever, chills, cough, chest pain, dyspnea.   All other systems were reviewed with the patient and are negative.  MEDICAL HISTORY:  Past Medical History:  Diagnosis Date   Arthritis    Rheumatoid arthritis   Celiac  disease    Chronic kidney disease    stage 3 from MD notes   COPD (chronic obstructive pulmonary disease) (HCC)    Dyspnea    with going up stairs   Family history of adverse reaction to anesthesia    father had hard time waking up   Headache    sinus headaches   Hot flashes    Hypertension    Iron deficiency anemia    Pneumonia    per patient "I have walking pneumonia"    SURGICAL HISTORY: Past Surgical History:  Procedure Laterality Date   COLONOSCOPY     ECTOPIC PREGNANCY SURGERY      x 2   IR IMAGING GUIDED PORT INSERTION  01/31/2022   REVERSE SHOULDER ARTHROPLASTY Right 02/02/2017   Procedure: RIGHT REVERSE SHOULDER ARTHROPLASTY;  Surgeon: Netta Cedars, MD;  Location: [facility];  Service: Orthopedics;  Laterality: Right;    I have reviewed the social history and family history with the patient and they are unchanged from previous note.  ALLERGIES:  is allergic to digoxin and related, gluten meal, penicillins, alendronate, and hydroxychloroquine.  MEDICATIONS:  Current Outpatient Medications  Medication Sig Dispense Refill   denosumab (PROLIA) 60 MG/ML SOSY injection Inject 60 mg into the skin every 6 (six) months.     dicyclomine (BENTYL) 10 MG capsule Take 1 capsule (10 mg total) by mouth 3 (three) times daily as needed for spasms. (Patient not taking: Reported on 06/29/2022) 30 capsule 0   diltiazem (CARDIZEM CD) 240 MG 24 hr capsule Take 1 capsule (240 mg total) by mouth daily. 30 capsule 1   furosemide (LASIX) 20 MG tablet Take 1 tablet (20 mg total) by mouth daily. 30 tablet 11   gabapentin (NEURONTIN) 300 MG capsule Take 300 mg by mouth in the morning.     InFLIXimab (REMICADE IV) Inject 10 mg into the vein every 2 (two) months.     Metoprolol Tartrate 75 MG TABS Take 150 mg by mouth 2 (two) times daily. 360 tablet 3   potassium chloride SA (KLOR-CON M20) 20 MEQ tablet Take 1 tablet (20 mEq total) by mouth daily. Take with furosemide 90 tablet 3   predniSONE (DELTASONE) 5  MG tablet Take 5 mg by mouth daily with breakfast.     tamsulosin (FLOMAX) 0.4 MG CAPS capsule Take 1 capsule (0.4 mg total) by mouth daily after supper. (Patient not taking: Reported on 06/29/2022) 30 capsule 1   Tiotropium Bromide-Olodaterol (STIOLTO RESPIMAT) 2.5-2.5 MCG/ACT AERS Inhale 2 puffs into the lungs daily. 4 g 5   vitamin C (ASCORBIC ACID) 500 MG tablet Take 500 mg by mouth daily.     No current facility-administered medications for this visit.   Facility-Administered Medications Ordered in Other Visits  Medication Dose Route Frequency Provider Last Rate Last Admin   sodium chloride flush (NS) 0.9 % injection 10 mL  10 mL Intracatheter PRN Truitt Merle, MD   10 mL at 03/14/22 1709    PHYSICAL EXAMINATION: ECOG PERFORMANCE STATUS: 1 - Symptomatic but completely ambulatory  Vitals:   07/24/22 1236  BP: 112/87  Pulse: 88  Resp: 18  Temp: 98.7 F (37.1 C)  SpO2: 99%   Filed Weights   07/24/22 1236  Weight: 111 lb 8 oz (50.6 kg)    GENERAL:alert, no distress and comfortable SKIN: Scattered ecchymoses.  No rash EYES: sclera clear LUNGS: clear with normal breathing effort HEART:  Irregular rate and rhythm, 119 apical in A-fib, mild bilateral lower extremity edema ABDOMEN:abdomen soft, non-tender and normal bowel sounds NEURO: alert & oriented x 3 with fluent speech PAC without erythema  LABORATORY DATA:  I have reviewed the data as listed    Latest Ref Rng & Units 07/24/2022   12:21 PM 07/17/2022    3:21 PM 07/10/2022    9:50 AM  CBC  WBC 4.0 - 10.5 K/uL 3.8  11.9  9.2   Hemoglobin 12.0 - 15.0 g/dL 8.1  10.1  9.6   Hematocrit 36.0 - 46.0 % 24.6  29.7  28.6   Platelets 150 - 400 K/uL 47  69  53         Latest Ref Rng & Units 07/24/2022   12:21 PM 07/17/2022    3:21 PM 07/10/2022    9:50 AM  CMP  Glucose 70 - 99 mg/dL 130  130  108   BUN 8 - 23 mg/dL 25  32  20   Creatinine 0.44 - 1.00 mg/dL 0.88  0.95  0.87   Sodium 135 - 145 mmol/L 136  135  137   Potassium  3.5 - 5.1 mmol/L 4.2  4.1  4.5   Chloride 98 - 111 mmol/L 99  96  99   CO2 22 - 32 mmol/L 30  31  32   Calcium 8.9 - 10.3 mg/dL 9.2  9.4  9.6   Total Protein 6.5 - 8.1 g/dL 5.8  5.7  5.7   Total Bilirubin 0.3 - 1.2 mg/dL 1.0  1.4  0.9   Alkaline Phos 38 - 126 U/L 43  47  49   AST 15 - 41 U/L _0 ALT 0 - 44 U/L _1 RADIOGRAPHIC STUDIES: I have personally reviewed the radiological images as listed and agreed with the findings in the report. No results found.   ASSESSMENT & PLAN:  [name] is a 76 y.o. female with    1. 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 SPEP and UPEP with immunofixation were negative. No lab evidence of hemolysis, erythropoietin level is normal.  -Her prior bone marrow biopsy in 2016 was negative except several small lymphoid aggregation, suspicious for low-grade B cell lymphoproliferative process, especially SLL. Her peripheral white count has been normal, no elevated lymphocytes -CT scan from 03/2015 was negative for adenopathy or splenomegaly. no B symptoms  -she has been under observation -She developed worsening back pain in Summer 2022 and underwent work up, pelvic MRI 07/04/21 showed a mass in the presacral space. -bone marrow biopsy on 01/05/22 showed hypercellular marrow with features of myelodysplastic/myeloproliferative neoplasm, increased 12% blasts, most consistent with CMML-2. Her BM biopsy was reviewed at [facility] and was felt to be CMML-1 with 5% blasts. Her previous presacral soft tissue biopsy which showed same CMML as her bone marrow biopsy. -she met Dr. Linus Orn at [facility] on 01/26/22. Vidaza was also recommended  -She began azacitadine daily for 7 days (5+2) every 28 days, starting 02/06/22. Tolerated mostly well -She was hospitalized 03/29/2022 - 04/13/2022 and went to 2 weeks of rehab.  Oncology work-up during hospitalization showed  stable chronic presacral soft tissue tumor, no other new imaging findings.  Repeat bone marrow biopsy showed persistent CMML with only 2% blasts, slightly lower than before.  There  is no other evidence of disease progression in the marrow -She resumed dose-reduced Vidaza to 5 days (q28 days) starting with cycle 3 on 5/22 -she completed 4 days and was readmitted 5/26 - 5/28 for CHF exacerbation, and again 6/26 - 6/30 -S/p cycle 4 6/19 - 6/23, she experienced chemo related anemia and thrombocytopenia, otherwise tolerated fairly well.  She was hospitalized again for recurrent CHF exacerbation but has recovered. 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 -She proceeded with cycle 5 azacitidine x5 days from 7/17-21, she tolerated mostly well except increased leg edema, weight gain, and mild fatigue and dyspnea.  Symptoms resolved with increased Lasix x3 days -Ms. Ching appears stable.  She has tolerated treatment well thus far, main complaint is decreased taste.  We reviewed symptom management and encourage p.o. -Labs reviewed, Hgb 8.1, platelet 47 K.  She has a large bruise on the front of her leg, denies bleeding.  We reviewed that her A-fib and CHF are exacerbated by severe anemia.  We discussed proceeding with a transfusion versus close monitoring, patient prefers to be closely followed for now. -We will repeat lab in 2 days, with blood transfusion if anemia worsens or fails to improve. She agrees.  -lab again next week, then f/up and next cycle in 2 weeks   2.  Hospitalization 04/01/2022 - 04/13/2022 for dyspnea, deconditioning, and A-fib with RVR; recurrent hospitalization 5/26-28 for CHF exacerbation, repeat admission 06/19/1929 for CHF -  She was found to have pleural effusion s/p thoracentesis 03/30/2022, cytology was negative for malignant cells.  Dyspnea resolved after hospitalization.  In the past 4-5 days she has mild exertional  dyspnea, okay to monitor for now.  We will repeat chest x-ray and possible [place] if she has recurrent pleural effusion - Bone marrow biopsy 04/04/2022 showed persistent CMML, decreased blasts 2%.  No disease progression in the marrow - Lumbar MRI showed stable presacral soft tissue mass.  - Heart rate controlled with high dose metoprolol, digoxin and diltiazem -She was discharged to rehab, PS improved.  Vidaza was dose reduced to 5 days with cycle 3 -She was readmitted after cycle 3-day 4 from 5/26-28 for CHF exacerbation.  Transfuse 1 unit and diuresed, symptoms improved; Home PT for now -She completed cycle 4 vidaza 6/19 - 6/23 and was readmitted 6/26- 6/30 for CHF exacerbation, A-fib in RVR, and abdominal pain.  Found to have acute on chronic cytopenias, transfused 1 unit and diuresed.  Improved symptomatically and discharged home.  Cardiology follow-up 7/6 -HR 119 today, in Afib. She ran out of metoprolol, cardiology refilling to pick up today   3. Symptom management: abdominal cramps, insomnia, pancytopenia, dyspnea, weakness, low appetite  -She developed abdominal cramps during the first week of vidaza, debilitating at first when not having BMs. -She corrected constipation, cramps have improved but not resolved.  On Bentyl as needed -I recommended mirtazapine for low appetite, she previously declined. I encouraged supplements  -She has worsening cytopenias after starting treatment, she receives irradiated blood products as needed  -Due to pancytopenia and deconditioning cycle 3 Vidaza was postponed but she was subsequently hospitalized 03/29/22 - 04/13/22 -Vidaza was reduced from 7 days monthly to 5 days monthly with cycle 3   4. COPD, AF, HTN -on resp meds and eliquis; continue per PCP, cardiology, and pulmonology  - she recently changed inhalers from stiolto to breztri, her HR and dyspnea issues worsened since then.  Switched back to [employer] in 03/2022 -Due  to thrombocytopenia, Eliquis has  been on hold -Continue PCP, cardiology, and pulmonology follow-up   5. Rheumatoid arthritis -on Remicade q8 weeks per rheumatologist Dr. Trudie Reed. We reviewed the risk of lymphoma and immunosupression on Remicade -I previously cc'd my note to Dr. Trudie Reed to consider an alternative given the new diagnosis  -she continues remidcade -She takes vitamin supplements and gets prolia injection q5month with her PCP.   PLAN: -labs reviewed, hgb 8.1, plt 47. Continue precautions -lab and 1u pRBC transfusion 8/2 if anemia worsens or fail to improve -lab weekly -F/up in 2 weeks with next cycle -cardiology refilling metoprolol   Orders Placed This Encounter  Procedures   Sample to Blood Bank    Standing Status:   Future    Standing Expiration Date:   07/25/2023   All questions were answered. The patient knows to call the clinic with any problems, questions or concerns. No barriers to learning was detected. I spent 20 minutes counseling the patient face to face. The total time spent in the appointment was 30 minutes and more than 50% was on counseling and review of test results and coordination of care.     LAlla Feeling NP 07/24/22

## 2022-07-24 ENCOUNTER — Encounter: Payer: Self-pay | Admitting: Nurse Practitioner

## 2022-07-24 ENCOUNTER — Other Ambulatory Visit: Payer: Self-pay

## 2022-07-24 ENCOUNTER — Inpatient Hospital Stay: Payer: Medicare Other

## 2022-07-24 ENCOUNTER — Inpatient Hospital Stay (HOSPITAL_BASED_OUTPATIENT_CLINIC_OR_DEPARTMENT_OTHER): Payer: Medicare Other | Admitting: Nurse Practitioner

## 2022-07-24 ENCOUNTER — Telehealth: Payer: Self-pay | Admitting: *Deleted

## 2022-07-24 ENCOUNTER — Other Ambulatory Visit: Payer: Self-pay | Admitting: *Deleted

## 2022-07-24 VITALS — BP 112/87 | HR 88 | Temp 98.7°F | Resp 18 | Ht 63.0 in | Wt 111.5 lb

## 2022-07-24 DIAGNOSIS — C931 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: Secondary | ICD-10-CM | POA: Diagnosis not present

## 2022-07-24 DIAGNOSIS — Z79899 Other long term (current) drug therapy: Secondary | ICD-10-CM | POA: Diagnosis not present

## 2022-07-24 DIAGNOSIS — Z5111 Encounter for antineoplastic chemotherapy: Secondary | ICD-10-CM | POA: Diagnosis not present

## 2022-07-24 DIAGNOSIS — Z95828 Presence of other vascular implants and grafts: Secondary | ICD-10-CM

## 2022-07-24 LAB — COMPREHENSIVE METABOLIC PANEL
ALT: 19 U/L (ref 0–44)
AST: 15 U/L (ref 15–41)
Albumin: 3.7 g/dL (ref 3.5–5.0)
Alkaline Phosphatase: 43 U/L (ref 38–126)
Anion gap: 7 (ref 5–15)
BUN: 25 mg/dL — ABNORMAL HIGH (ref 8–23)
CO2: 30 mmol/L (ref 22–32)
Calcium: 9.2 mg/dL (ref 8.9–10.3)
Chloride: 99 mmol/L (ref 98–111)
Creatinine, Ser: 0.88 mg/dL (ref 0.44–1.00)
GFR, Estimated: >60 mL/min (ref >60)
Glucose, Bld: 130 mg/dL — ABNORMAL HIGH (ref 70–99)
Potassium: 4.2 mmol/L (ref 3.5–5.1)
Sodium: 136 mmol/L (ref 135–145)
Total Bilirubin: 1 mg/dL (ref 0.3–1.2)
Total Protein: 5.8 g/dL — ABNORMAL LOW (ref 6.5–8.1)

## 2022-07-24 LAB — CBC WITH DIFFERENTIAL/PLATELET
Abs Immature Granulocytes: 0.03 10*3/uL (ref 0.00–0.07)
Basophils Absolute: 0 10*3/uL (ref 0.0–0.1)
Basophils Relative: 0 %
Eosinophils Absolute: 0 10*3/uL (ref 0.0–0.5)
Eosinophils Relative: 1 %
HCT: 24.6 % — ABNORMAL LOW (ref 36.0–46.0)
Hemoglobin: 8.1 g/dL — ABNORMAL LOW (ref 12.0–15.0)
Immature Granulocytes: 1 %
Lymphocytes Relative: 16 %
Lymphs Abs: 0.6 10*3/uL — ABNORMAL LOW (ref 0.7–4.0)
MCH: 34.2 pg — ABNORMAL HIGH (ref 26.0–34.0)
MCHC: 32.9 g/dL (ref 30.0–36.0)
MCV: 103.8 fL — ABNORMAL HIGH (ref 80.0–100.0)
Monocytes Absolute: 0.9 10*3/uL (ref 0.1–1.0)
Monocytes Relative: 23 %
Neutro Abs: 2.2 10*3/uL (ref 1.7–7.7)
Neutrophils Relative %: 59 %
Platelets: 47 10*3/uL — ABNORMAL LOW (ref 150–400)
RBC: 2.37 MIL/uL — ABNORMAL LOW (ref 3.87–5.11)
RDW: 17.2 % — ABNORMAL HIGH (ref 11.5–15.5)
WBC: 3.8 10*3/uL — ABNORMAL LOW (ref 4.0–10.5)
nRBC: 0 % (ref 0.0–0.2)

## 2022-07-24 MED ORDER — METOPROLOL TARTRATE 75 MG PO TABS: 150.0000 mg | ORAL_TABLET | Freq: Two times a day (BID) | ORAL | 3 refills | Status: DC

## 2022-07-24 MED ORDER — SODIUM CHLORIDE 0.9% FLUSH
10.0000 mL | Freq: Once | INTRAVENOUS | Status: AC
  Administered 2022-07-24: 10 mL

## 2022-07-24 NOTE — Telephone Encounter (Signed)
Detailed message left that new script for Metoprolol tart 75 mg 2 tabs twice daily was sent to [facility] at pt's  request .Verified with pharmacy that they received refill request and this will be ready in a few hours for pick up ./cy

## 2022-07-25 ENCOUNTER — Other Ambulatory Visit: Payer: Self-pay

## 2022-07-25 ENCOUNTER — Encounter: Payer: Self-pay | Admitting: Hematology

## 2022-07-25 ENCOUNTER — Telehealth: Payer: Self-pay

## 2022-07-25 DIAGNOSIS — C931 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: Secondary | ICD-10-CM

## 2022-07-25 NOTE — Telephone Encounter (Signed)
This nurse reached out to patient to schedule blood transfusion.  Offered appointment time of 130 pm on Wed. 8/2 with port flush with labs at 1230 pm.  Patient is in agreement with time.  No questions or concerns at this time.

## 2022-07-26 ENCOUNTER — Inpatient Hospital Stay: Payer: Medicare Other | Attending: Nurse Practitioner

## 2022-07-26 ENCOUNTER — Inpatient Hospital Stay: Payer: Medicare Other

## 2022-07-26 ENCOUNTER — Other Ambulatory Visit: Payer: Self-pay

## 2022-07-26 VITALS — BP 118/82 | HR 92 | Temp 97.6°F | Resp 17

## 2022-07-26 DIAGNOSIS — C931 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: Secondary | ICD-10-CM | POA: Insufficient documentation

## 2022-07-26 DIAGNOSIS — Z79899 Other long term (current) drug therapy: Secondary | ICD-10-CM | POA: Diagnosis not present

## 2022-07-26 DIAGNOSIS — Z5111 Encounter for antineoplastic chemotherapy: Secondary | ICD-10-CM | POA: Diagnosis not present

## 2022-07-26 DIAGNOSIS — D638 Anemia in other chronic diseases classified elsewhere: Secondary | ICD-10-CM

## 2022-07-26 DIAGNOSIS — Z95828 Presence of other vascular implants and grafts: Secondary | ICD-10-CM

## 2022-07-26 LAB — CBC WITH DIFFERENTIAL/PLATELET
Abs Immature Granulocytes: 0.02 10*3/uL (ref 0.00–0.07)
Basophils Absolute: 0 10*3/uL (ref 0.0–0.1)
Basophils Relative: 0 %
Eosinophils Absolute: 0.1 10*3/uL (ref 0.0–0.5)
Eosinophils Relative: 2 %
HCT: 23.5 % — ABNORMAL LOW (ref 36.0–46.0)
Hemoglobin: 7.8 g/dL — ABNORMAL LOW (ref 12.0–15.0)
Immature Granulocytes: 1 %
Lymphocytes Relative: 22 %
Lymphs Abs: 0.8 10*3/uL (ref 0.7–4.0)
MCH: 35 pg — ABNORMAL HIGH (ref 26.0–34.0)
MCHC: 33.2 g/dL (ref 30.0–36.0)
MCV: 105.4 fL — ABNORMAL HIGH (ref 80.0–100.0)
Monocytes Absolute: 0.6 10*3/uL (ref 0.1–1.0)
Monocytes Relative: 16 %
Neutro Abs: 2.1 10*3/uL (ref 1.7–7.7)
Neutrophils Relative %: 59 %
Platelets: 49 10*3/uL — ABNORMAL LOW (ref 150–400)
RBC: 2.23 MIL/uL — ABNORMAL LOW (ref 3.87–5.11)
RDW: 17.5 % — ABNORMAL HIGH (ref 11.5–15.5)
WBC: 3.6 10*3/uL — ABNORMAL LOW (ref 4.0–10.5)
nRBC: 0 % (ref 0.0–0.2)

## 2022-07-26 LAB — COMPREHENSIVE METABOLIC PANEL
ALT: 18 U/L (ref 0–44)
AST: 15 U/L (ref 15–41)
Albumin: 3.6 g/dL (ref 3.5–5.0)
Alkaline Phosphatase: 44 U/L (ref 38–126)
Anion gap: 8 (ref 5–15)
BUN: 23 mg/dL (ref 8–23)
CO2: 28 mmol/L (ref 22–32)
Calcium: 8.8 mg/dL — ABNORMAL LOW (ref 8.9–10.3)
Chloride: 101 mmol/L (ref 98–111)
Creatinine, Ser: 0.97 mg/dL (ref 0.44–1.00)
GFR, Estimated: >60 mL/min (ref >60)
Glucose, Bld: 173 mg/dL — ABNORMAL HIGH (ref 70–99)
Potassium: 4.1 mmol/L (ref 3.5–5.1)
Sodium: 137 mmol/L (ref 135–145)
Total Bilirubin: 0.8 mg/dL (ref 0.3–1.2)
Total Protein: 5.4 g/dL — ABNORMAL LOW (ref 6.5–8.1)

## 2022-07-26 LAB — PREPARE RBC (CROSSMATCH)

## 2022-07-26 LAB — SAMPLE TO BLOOD BANK

## 2022-07-26 MED ORDER — ACETAMINOPHEN 325 MG PO TABS
650.0000 mg | ORAL_TABLET | Freq: Once | ORAL | Status: AC
  Administered 2022-07-26: 650 mg via ORAL
  Filled 2022-07-26: qty 2

## 2022-07-26 MED ORDER — SODIUM CHLORIDE 0.9% FLUSH
10.0000 mL | Freq: Once | INTRAVENOUS | Status: AC
  Administered 2022-07-26: 10 mL

## 2022-07-26 MED ORDER — SODIUM CHLORIDE 0.9% IV SOLUTION
250.0000 mL | Freq: Once | INTRAVENOUS | Status: AC
  Administered 2022-07-26: 250 mL via INTRAVENOUS

## 2022-07-26 MED ORDER — DIPHENHYDRAMINE HCL 25 MG PO CAPS
25.0000 mg | ORAL_CAPSULE | Freq: Once | ORAL | Status: AC
  Administered 2022-07-26: 25 mg via ORAL
  Filled 2022-07-26: qty 1

## 2022-07-26 MED ORDER — HEPARIN SOD (PORK) LOCK FLUSH 100 UNIT/ML IV SOLN
500.0000 [IU] | Freq: Once | INTRAVENOUS | Status: AC
  Administered 2022-07-26: 500 [IU] via INTRAVENOUS

## 2022-07-26 NOTE — Patient Instructions (Signed)
Blood Transfusion, Adult, Care After This sheet gives you information about how to care for yourself after your procedure. Your doctor may also give you more specific instructions. If you have problems or questions, contact your doctor. What can I expect after the procedure? After the procedure, it is common to have: Bruising and soreness at the IV site. A headache. Follow these instructions at home: Insertion site care     Follow instructions from your doctor about how to take care of your insertion site. This is where an IV tube was put into your vein. Make sure you: Wash your hands with soap and water before and after you change your bandage (dressing). If you cannot use soap and water, use hand sanitizer. Change your bandage as told by your doctor. Check your insertion site every day for signs of infection. Check for: Redness, swelling, or pain. Bleeding from the site. Warmth. Pus or a bad smell. General instructions Take over-the-counter and prescription medicines only as told by your doctor. Rest as told by your doctor. Go back to your normal activities as told by your doctor. Keep all follow-up visits as told by your doctor. This is important. Contact a doctor if: You have itching or red, swollen areas of skin (hives). You feel worried or nervous (anxious). You feel weak after doing your normal activities. You have redness, swelling, warmth, or pain around the insertion site. You have blood coming from the insertion site, and the blood does not stop with pressure. You have pus or a bad smell coming from the insertion site. Get help right away if: You have signs of a serious reaction. This may be coming from an allergy or the body's defense system (immune system). Signs include: Trouble breathing or shortness of breath. Swelling of the face or feeling warm (flushed). Fever or chills. Head, chest, or back pain. Dark pee (urine) or blood in the pee. Widespread rash. Fast  heartbeat. Feeling dizzy or light-headed. You may receive your blood transfusion in an outpatient setting. If so, you will be told whom to contact to report any reactions. These symptoms may be an emergency. Do not wait to see if the symptoms will go away. Get medical help right away. Call your local emergency services (911 in the [place] Do not drive yourself to the hospital. Summary Bruising and soreness at the IV site are common. Check your insertion site every day for signs of infection. Rest as told by your doctor. Go back to your normal activities as told by your doctor. Get help right away if you have signs of a serious reaction. This information is not intended to replace advice given to you by your health care provider. Make sure you discuss any questions you have with your health care provider. Document Revised: 04/07/2021 Document Reviewed: 06/05/2019 Elsevier Patient Education  [place].

## 2022-07-27 LAB — TYPE AND SCREEN
ABO/RH(D): B POS
Antibody Screen: NEGATIVE
Unit division: 0

## 2022-07-27 LAB — BPAM RBC
Blood Product Expiration Date: 202308252359
ISSUE DATE / TIME: 202308021511
Unit Type and Rh: 7300

## 2022-07-31 ENCOUNTER — Inpatient Hospital Stay: Payer: Medicare Other

## 2022-07-31 DIAGNOSIS — Z79899 Other long term (current) drug therapy: Secondary | ICD-10-CM | POA: Diagnosis not present

## 2022-07-31 DIAGNOSIS — Z95828 Presence of other vascular implants and grafts: Secondary | ICD-10-CM

## 2022-07-31 DIAGNOSIS — C931 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: Secondary | ICD-10-CM | POA: Diagnosis not present

## 2022-07-31 DIAGNOSIS — Z5111 Encounter for antineoplastic chemotherapy: Secondary | ICD-10-CM | POA: Diagnosis not present

## 2022-07-31 LAB — CBC WITH DIFFERENTIAL/PLATELET
Abs Immature Granulocytes: 0.03 10*3/uL (ref 0.00–0.07)
Basophils Absolute: 0 10*3/uL (ref 0.0–0.1)
Basophils Relative: 0 %
Eosinophils Absolute: 0 10*3/uL (ref 0.0–0.5)
Eosinophils Relative: 0 %
HCT: 28.6 % — ABNORMAL LOW (ref 36.0–46.0)
Hemoglobin: 9.5 g/dL — ABNORMAL LOW (ref 12.0–15.0)
Immature Granulocytes: 1 %
Lymphocytes Relative: 16 %
Lymphs Abs: 0.7 10*3/uL (ref 0.7–4.0)
MCH: 34.1 pg — ABNORMAL HIGH (ref 26.0–34.0)
MCHC: 33.2 g/dL (ref 30.0–36.0)
MCV: 102.5 fL — ABNORMAL HIGH (ref 80.0–100.0)
Monocytes Absolute: 0.9 10*3/uL (ref 0.1–1.0)
Monocytes Relative: 19 %
Neutro Abs: 3 10*3/uL (ref 1.7–7.7)
Neutrophils Relative %: 64 %
Platelets: 29 10*3/uL — ABNORMAL LOW (ref 150–400)
RBC: 2.79 MIL/uL — ABNORMAL LOW (ref 3.87–5.11)
RDW: 18.2 % — ABNORMAL HIGH (ref 11.5–15.5)
WBC: 4.7 10*3/uL (ref 4.0–10.5)
nRBC: 0 % (ref 0.0–0.2)

## 2022-07-31 LAB — CMP (CANCER CENTER ONLY)
ALT: 18 U/L (ref 0–44)
AST: 17 U/L (ref 15–41)
Albumin: 3.7 g/dL (ref 3.5–5.0)
Alkaline Phosphatase: 45 U/L (ref 38–126)
Anion gap: 5 (ref 5–15)
BUN: 22 mg/dL (ref 8–23)
CO2: 30 mmol/L (ref 22–32)
Calcium: 8.4 mg/dL — ABNORMAL LOW (ref 8.9–10.3)
Chloride: 101 mmol/L (ref 98–111)
Creatinine: 0.84 mg/dL (ref 0.44–1.00)
GFR, Estimated: >60 mL/min (ref >60)
Glucose, Bld: 149 mg/dL — ABNORMAL HIGH (ref 70–99)
Potassium: 4.2 mmol/L (ref 3.5–5.1)
Sodium: 136 mmol/L (ref 135–145)
Total Bilirubin: 0.9 mg/dL (ref 0.3–1.2)
Total Protein: 5.7 g/dL — ABNORMAL LOW (ref 6.5–8.1)

## 2022-07-31 MED ORDER — SODIUM CHLORIDE 0.9% FLUSH
10.0000 mL | Freq: Once | INTRAVENOUS | Status: AC
  Administered 2022-07-31: 10 mL

## 2022-08-02 ENCOUNTER — Other Ambulatory Visit: Payer: Self-pay

## 2022-08-04 ENCOUNTER — Other Ambulatory Visit: Payer: Self-pay

## 2022-08-04 ENCOUNTER — Other Ambulatory Visit: Payer: Medicare Other

## 2022-08-04 ENCOUNTER — Inpatient Hospital Stay: Payer: Medicare Other

## 2022-08-04 ENCOUNTER — Telehealth: Payer: Self-pay | Admitting: Cardiovascular Disease

## 2022-08-04 DIAGNOSIS — C931 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: Secondary | ICD-10-CM | POA: Diagnosis not present

## 2022-08-04 DIAGNOSIS — Z95828 Presence of other vascular implants and grafts: Secondary | ICD-10-CM

## 2022-08-04 DIAGNOSIS — D638 Anemia in other chronic diseases classified elsewhere: Secondary | ICD-10-CM

## 2022-08-04 DIAGNOSIS — Z79899 Other long term (current) drug therapy: Secondary | ICD-10-CM | POA: Diagnosis not present

## 2022-08-04 DIAGNOSIS — D696 Thrombocytopenia, unspecified: Secondary | ICD-10-CM

## 2022-08-04 DIAGNOSIS — Z5111 Encounter for antineoplastic chemotherapy: Secondary | ICD-10-CM | POA: Diagnosis not present

## 2022-08-04 LAB — CBC WITH DIFFERENTIAL/PLATELET
Abs Immature Granulocytes: 0.07 10*3/uL (ref 0.00–0.07)
Basophils Absolute: 0.2 10*3/uL — ABNORMAL HIGH (ref 0.0–0.1)
Basophils Relative: 3 %
Eosinophils Absolute: 0.1 10*3/uL (ref 0.0–0.5)
Eosinophils Relative: 1 %
HCT: 29.1 % — ABNORMAL LOW (ref 36.0–46.0)
Hemoglobin: 9.6 g/dL — ABNORMAL LOW (ref 12.0–15.0)
Immature Granulocytes: 1 %
Lymphocytes Relative: 21 %
Lymphs Abs: 1.2 10*3/uL (ref 0.7–4.0)
MCH: 34.2 pg — ABNORMAL HIGH (ref 26.0–34.0)
MCHC: 33 g/dL (ref 30.0–36.0)
MCV: 103.6 fL — ABNORMAL HIGH (ref 80.0–100.0)
Monocytes Absolute: 1 10*3/uL (ref 0.1–1.0)
Monocytes Relative: 16 %
Neutro Abs: 3.5 10*3/uL (ref 1.7–7.7)
Neutrophils Relative %: 58 %
Platelets: 32 10*3/uL — ABNORMAL LOW (ref 150–400)
RBC: 2.81 MIL/uL — ABNORMAL LOW (ref 3.87–5.11)
RDW: 18.4 % — ABNORMAL HIGH (ref 11.5–15.5)
WBC: 6 10*3/uL (ref 4.0–10.5)
nRBC: 0 % (ref 0.0–0.2)

## 2022-08-04 MED ORDER — SODIUM CHLORIDE 0.9% FLUSH
10.0000 mL | Freq: Once | INTRAVENOUS | Status: AC
  Administered 2022-08-04: 10 mL

## 2022-08-04 MED FILL — Dexamethasone Sodium Phosphate Inj 100 MG/10ML: INTRAMUSCULAR | Qty: 1 | Status: AC

## 2022-08-04 NOTE — Telephone Encounter (Signed)
Returned call to [name] at number on file and left message that we do not manage home health. She will need to contact PCP for them. This was handwritten and faxed back on their original request forms.

## 2022-08-04 NOTE — Telephone Encounter (Signed)
Colletta Maryland is calling regarding two orders they are needing signed and faxed back. She states they were sent to [phone] on 06/14 and have also been re-faxed today. They are regarding resumption of care and labs. Please advise.

## 2022-08-05 ENCOUNTER — Other Ambulatory Visit: Payer: Self-pay

## 2022-08-05 ENCOUNTER — Inpatient Hospital Stay: Payer: Medicare Other

## 2022-08-05 VITALS — BP 117/73 | HR 89 | Temp 98.4°F | Resp 17

## 2022-08-05 DIAGNOSIS — Z5111 Encounter for antineoplastic chemotherapy: Secondary | ICD-10-CM | POA: Diagnosis not present

## 2022-08-05 DIAGNOSIS — C931 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: Secondary | ICD-10-CM | POA: Diagnosis not present

## 2022-08-05 DIAGNOSIS — Z95828 Presence of other vascular implants and grafts: Secondary | ICD-10-CM

## 2022-08-05 DIAGNOSIS — Z79899 Other long term (current) drug therapy: Secondary | ICD-10-CM | POA: Diagnosis not present

## 2022-08-05 DIAGNOSIS — D638 Anemia in other chronic diseases classified elsewhere: Secondary | ICD-10-CM

## 2022-08-05 DIAGNOSIS — D696 Thrombocytopenia, unspecified: Secondary | ICD-10-CM

## 2022-08-05 MED ORDER — SODIUM CHLORIDE 0.9% IV SOLUTION
250.0000 mL | Freq: Once | INTRAVENOUS | Status: AC
  Administered 2022-08-05: 250 mL via INTRAVENOUS

## 2022-08-05 MED ORDER — ACETAMINOPHEN 325 MG PO TABS
650.0000 mg | ORAL_TABLET | Freq: Once | ORAL | Status: AC
  Administered 2022-08-05: 650 mg via ORAL

## 2022-08-05 MED ORDER — SODIUM CHLORIDE 0.9% FLUSH
10.0000 mL | Freq: Once | INTRAVENOUS | Status: AC
  Administered 2022-08-05: 10 mL

## 2022-08-05 MED ORDER — DIPHENHYDRAMINE HCL 25 MG PO CAPS
ORAL_CAPSULE | ORAL | Status: AC
  Filled 2022-08-05: qty 1

## 2022-08-05 MED ORDER — HEPARIN SOD (PORK) LOCK FLUSH 100 UNIT/ML IV SOLN
500.0000 [IU] | Freq: Once | INTRAVENOUS | Status: AC
  Administered 2022-08-05: 500 [IU] via INTRAVENOUS

## 2022-08-05 MED ORDER — ACETAMINOPHEN 325 MG PO TABS
ORAL_TABLET | ORAL | Status: AC
  Filled 2022-08-05: qty 2

## 2022-08-05 MED ORDER — DIPHENHYDRAMINE HCL 25 MG PO CAPS
25.0000 mg | ORAL_CAPSULE | Freq: Once | ORAL | Status: AC
  Administered 2022-08-05: 25 mg via ORAL

## 2022-08-05 NOTE — Patient Instructions (Signed)

## 2022-08-07 ENCOUNTER — Inpatient Hospital Stay: Payer: Medicare Other

## 2022-08-07 ENCOUNTER — Inpatient Hospital Stay: Payer: Medicare Other | Admitting: Nutrition

## 2022-08-07 ENCOUNTER — Other Ambulatory Visit: Payer: Self-pay

## 2022-08-07 ENCOUNTER — Inpatient Hospital Stay (HOSPITAL_BASED_OUTPATIENT_CLINIC_OR_DEPARTMENT_OTHER): Payer: Medicare Other | Admitting: Hematology

## 2022-08-07 ENCOUNTER — Encounter: Payer: Self-pay | Admitting: Hematology

## 2022-08-07 VITALS — BP 114/74 | HR 97 | Temp 97.6°F | Resp 14 | Ht 63.0 in | Wt 116.7 lb

## 2022-08-07 DIAGNOSIS — C931 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: Secondary | ICD-10-CM

## 2022-08-07 DIAGNOSIS — Z79899 Other long term (current) drug therapy: Secondary | ICD-10-CM | POA: Diagnosis not present

## 2022-08-07 DIAGNOSIS — Z95828 Presence of other vascular implants and grafts: Secondary | ICD-10-CM

## 2022-08-07 DIAGNOSIS — Z5111 Encounter for antineoplastic chemotherapy: Secondary | ICD-10-CM | POA: Diagnosis not present

## 2022-08-07 LAB — CBC WITH DIFFERENTIAL/PLATELET
Abs Immature Granulocytes: 0.08 10*3/uL — ABNORMAL HIGH (ref 0.00–0.07)
Basophils Absolute: 0.1 10*3/uL (ref 0.0–0.1)
Basophils Relative: 1 %
Eosinophils Absolute: 0 10*3/uL (ref 0.0–0.5)
Eosinophils Relative: 1 %
HCT: 29.1 % — ABNORMAL LOW (ref 36.0–46.0)
Hemoglobin: 9.6 g/dL — ABNORMAL LOW (ref 12.0–15.0)
Immature Granulocytes: 1 %
Lymphocytes Relative: 35 %
Lymphs Abs: 2 10*3/uL (ref 0.7–4.0)
MCH: 34.2 pg — ABNORMAL HIGH (ref 26.0–34.0)
MCHC: 33 g/dL (ref 30.0–36.0)
MCV: 103.6 fL — ABNORMAL HIGH (ref 80.0–100.0)
Monocytes Absolute: 1.8 10*3/uL — ABNORMAL HIGH (ref 0.1–1.0)
Monocytes Relative: 31 %
Neutro Abs: 1.7 10*3/uL (ref 1.7–7.7)
Neutrophils Relative %: 31 %
Platelets: 51 10*3/uL — ABNORMAL LOW (ref 150–400)
RBC: 2.81 MIL/uL — ABNORMAL LOW (ref 3.87–5.11)
RDW: 18.4 % — ABNORMAL HIGH (ref 11.5–15.5)
WBC: 5.7 10*3/uL (ref 4.0–10.5)
nRBC: 0.4 % — ABNORMAL HIGH (ref 0.0–0.2)

## 2022-08-07 LAB — CMP (CANCER CENTER ONLY)
ALT: 17 U/L (ref 0–44)
AST: 15 U/L (ref 15–41)
Albumin: 4 g/dL (ref 3.5–5.0)
Alkaline Phosphatase: 44 U/L (ref 38–126)
Anion gap: 6 (ref 5–15)
BUN: 18 mg/dL (ref 8–23)
CO2: 32 mmol/L (ref 22–32)
Calcium: 9.6 mg/dL (ref 8.9–10.3)
Chloride: 99 mmol/L (ref 98–111)
Creatinine: 0.89 mg/dL (ref 0.44–1.00)
GFR, Estimated: >60 mL/min (ref >60)
Glucose, Bld: 112 mg/dL — ABNORMAL HIGH (ref 70–99)
Potassium: 3.7 mmol/L (ref 3.5–5.1)
Sodium: 137 mmol/L (ref 135–145)
Total Bilirubin: 0.9 mg/dL (ref 0.3–1.2)
Total Protein: 6 g/dL — ABNORMAL LOW (ref 6.5–8.1)

## 2022-08-07 LAB — PREPARE PLATELET PHERESIS
Unit division: 0
Unit division: 0

## 2022-08-07 LAB — BPAM PLATELET PHERESIS
Blood Product Expiration Date: 202308142359
Blood Product Expiration Date: 202308152359
ISSUE DATE / TIME: 202308120939
ISSUE DATE / TIME: 202308121103
Unit Type and Rh: 6200
Unit Type and Rh: 8400

## 2022-08-07 MED ORDER — SODIUM CHLORIDE 0.9 % IV SOLN
75.0000 mg/m2 | Freq: Once | INTRAVENOUS | Status: AC
  Administered 2022-08-07: 120 mg via INTRAVENOUS
  Filled 2022-08-07: qty 12

## 2022-08-07 MED ORDER — SODIUM CHLORIDE 0.9% FLUSH
10.0000 mL | Freq: Once | INTRAVENOUS | Status: AC
  Administered 2022-08-07: 10 mL

## 2022-08-07 MED ORDER — SODIUM CHLORIDE 0.9% FLUSH
10.0000 mL | INTRAVENOUS | Status: DC | PRN
  Administered 2022-08-07: 10 mL

## 2022-08-07 MED ORDER — SODIUM CHLORIDE 0.9 % IV SOLN: Freq: Once | INTRAVENOUS | Status: AC

## 2022-08-07 MED ORDER — PALONOSETRON HCL INJECTION 0.25 MG/5ML
INTRAVENOUS | Status: AC
  Filled 2022-08-07: qty 5

## 2022-08-07 MED ORDER — SODIUM CHLORIDE 0.9 % IV SOLN
10.0000 mg | Freq: Once | INTRAVENOUS | Status: AC
  Administered 2022-08-07: 10 mg via INTRAVENOUS
  Filled 2022-08-07: qty 10

## 2022-08-07 MED ORDER — HEPARIN SOD (PORK) LOCK FLUSH 100 UNIT/ML IV SOLN
500.0000 [IU] | Freq: Once | INTRAVENOUS | Status: AC | PRN
  Administered 2022-08-07: 500 [IU]

## 2022-08-07 MED ORDER — PALONOSETRON HCL INJECTION 0.25 MG/5ML
0.2500 mg | Freq: Once | INTRAVENOUS | Status: AC
  Administered 2022-08-07: 0.25 mg via INTRAVENOUS

## 2022-08-07 MED FILL — Dexamethasone Sodium Phosphate Inj 100 MG/10ML: INTRAMUSCULAR | Qty: 1 | Status: AC

## 2022-08-07 NOTE — Patient Instructions (Signed)
[facility]  Discharge Instructions: Thank you for choosing [facility] to provide your oncology and hematology care.   If you have a lab appointment with the [facility], please go directly to the [facility] and check in at the registration area.   Wear comfortable clothing and clothing appropriate for easy access to any Portacath or PICC line.   We strive to give you quality time with your provider. You may need to reschedule your appointment if you arrive late (15 or more minutes).  Arriving late affects you and other patients whose appointments are after yours.  Also, if you miss three or more appointments without notifying the office, you may be dismissed from the clinic at the provider's discretion.      For prescription refill requests, have your pharmacy contact our office and allow 72 hours for refills to be completed.    Today you received the following chemotherapy and/or immunotherapy agents: vidaza      To help prevent nausea and vomiting after your treatment, we encourage you to take your nausea medication as directed.  BELOW ARE SYMPTOMS THAT SHOULD BE REPORTED IMMEDIATELY: *FEVER GREATER THAN 100.4 F (38 C) OR HIGHER *CHILLS OR SWEATING *NAUSEA AND VOMITING THAT IS NOT CONTROLLED WITH YOUR NAUSEA MEDICATION *UNUSUAL SHORTNESS OF BREATH *UNUSUAL BRUISING OR BLEEDING *URINARY PROBLEMS (pain or burning when urinating, or frequent urination) *BOWEL PROBLEMS (unusual diarrhea, constipation, pain near the anus) TENDERNESS IN MOUTH AND THROAT WITH OR WITHOUT PRESENCE OF ULCERS (sore throat, sores in mouth, or a toothache) UNUSUAL RASH, SWELLING OR PAIN  UNUSUAL VAGINAL DISCHARGE OR ITCHING   Items with * indicate a potential emergency and should be followed up as soon as possible or go to the Emergency Department if any problems should occur.  Please show the CHEMOTHERAPY ALERT CARD or IMMUNOTHERAPY ALERT CARD at check-in to the  Emergency Department and triage nurse.  Should you have questions after your visit or need to cancel or reschedule your appointment, please contact [facility]  Dept: [phone]  and follow the prompts.  Office hours are 8:00 a.m. to 4:30 p.m. Monday - Friday. Please note that voicemails left after 4:00 p.m. may not be returned until the following business day.  We are closed weekends and major holidays. You have access to a nurse at all times for urgent questions. Please call the main number to the clinic Dept: [phone] and follow the prompts.   For any non-urgent questions, you may also contact your provider using MyChart. We now offer e-Visits for anyone 71 and older to request care online for non-urgent symptoms. For details visit [email or web address].   Also download the MyChart app! Go to the app store, search "MyChart", open the app, select [facility], and log in with your MyChart username and password.  Masks are optional in the cancer centers. If you would like for your care team to wear a mask while they are taking care of you, please let them know. You may have one support person who is at least 76 years old accompany you for your appointments.

## 2022-08-07 NOTE — Progress Notes (Signed)
[facility]   [phone] Fax:([phone]) [phone]   Clinic Follow up Note   Patient Care Team: [name], MD as PCP - General (Family Medicine) [name], Wonda Cheng, MD as PCP - Cardiology (Cardiology) Gavin Pound, MD as Consulting Physician (Rheumatology) Truitt Merle, MD as Consulting Physician (Hematology) Juanita Craver, MD as Consulting Physician (Gastroenterology) Garner Nash, DO as Consulting Physician (Pulmonary Disease)  Date of Service:  08/07/2022  CHIEF COMPLAINT: f/u of CMML  CURRENT THERAPY:  Azacitadine, days 1-5 q28d, starting 02/06/22  ASSESSMENT & PLAN:  [name] is a 76 y.o. female with   1. 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 SPEP and UPEP with immunofixation were negative. No lab evidence of hemolysis, erythropoietin level is normal.  -Her prior bone marrow biopsy in 2016 was negative except several small lymphoid aggregation, suspicious for low-grade B cell lymphoproliferative process, especially SLL. Her peripheral white count has been normal, no elevated lymphocytes -CT scan from 03/2015 was negative for adenopathy or splenomegaly. no B symptoms  --She developed worsening back pain, pelvic MRI 07/04/21 showed a mass in the presacral space. 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 Her BM biopsy was reviewed at [facility] and was felt to be CMML-1 with 5% blasts.  -She began azacitadine on 02/06/22.  -She was hospitalized 03/29/22 - 04/13/22 for A.fib with RVR. Repeat bone marrow biopsy during hospitalization showed persistent CMML with only 2% blasts, slightly lower than before.  There is no other evidence of disease progression in the marrow -she was readmitted 5/26  - 5/28 and 6/26 - 6/30 for CHF exacerbation. 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 -she reports she is getting around better lately. She continues to tolerate treatment moderately well overall with reduced dose. -Labs reviewed, Hgb 9.6, platelet 51K, overall stable. Will proceed with C6D1 azacitadine today.   2.  A-fib with RVR; CHF -hospitalized three times in 2023 -thoracentesis for PE 03/30/22, cytology was negative for malignant cells.   - Heart rate controlled with high dose metoprolol, digoxin and diltiazem   3. COPD, AF, HTN -on resp meds and eliquis; continue per PCP, cardiology, and pulmonology  - she recently changed inhalers from stiolto to breztri, her HR and dyspnea issues worsened since then.  Switched back to [employer] in 03/2022 -Due to thrombocytopenia, Eliquis has been on hold -Continue PCP, cardiology, and pulmonology follow-up   4. Rheumatoid arthritis -on Remicade q8 weeks per rheumatologist Dr. Trudie Reed.  -She takes vitamin supplements and gets prolia injection q67month with her PCP.     PLAN: -proceed with C6D1 azacitadine today and daily through 8/18 -weekly lab to see if she needs blood transfusion  -lab, flush, f/u, and C7 azacitadine starting 09/04/22 -I encouraged her to f/u with Dr. PLinus [facility], and reached out to him    No problem-specific Assessment & Plan notes found for this encounter.   SUMMARY OF ONCOLOGIC HISTORY: Oncology History  CM[facility] (ch[facility]ocytic [place]) [place]  11/09/2021 Initial Biopsy   DIAGNOSIS:   -  Monoclonal B-cell population with co-expression of CD5 comprises 17%  of all lymphocytes  -  See comment   COMMENT:  In addition to the clonal B-cell population, there is a myeloblast  population (  CD34, CD38, HLA-DR, CD117, CD123 and CD33) that comprises 2% of the total cellular events.  Please see concurrent tissue biopsy (below) for additional  work-up and final diagnosis.    FINAL MICROSCOPIC DIAGNOSIS:   A. SOFT TISSUE MASS, PRE SACRAL, NEEDLE CORE BIOPSY:  -  Chronic lymphocytic leukemia/small lymphocytic lymphoma  -  Extra medullary hematopoiesis  -  See comment   COMMENT:  The biopsy consists of multiple soft tissue cores with lymphoid nodules and a dense hematopoietic infiltrate consistent with extra medullary hematopoiesis.  MPO and E-cadherin highlight myeloid and erythroid precursors respectively.  CD34 highlights increased vasculature and is also positive within the cytoplasm of megakaryocytes.  A few small, immature mononuclear cells appear to be positive for CD34 and CD117. TdT shows rare, scattered positive cells.  CD20 highlights aggregates of B cells which are admixed with CD3 positive T cells.  T cells are an admixture of CD4 and CD8.  The B cells are also positive for CD5, CD23 and Bcl-2.  The B cells do not show significant staining for CD10, BCL6 or cyclin D1.  CD138 highlights scattered plasma cells which are polytypic by kappa and lambda in situ hybridization.  Flow cytometry performed on the sample (see WL [id number]) identified a kappa restricted CD5 positive B-cell population comprising 70% of lymphocytes.  In addition, a small myeloblast population comprised 2% of the total cellular events.   Overall, the findings are consistent with soft tissue involvement by  chronic lymphocytic leukemia/small lymphocytic lymphoma and extra medullary hematopoiesis. In reviewing the patient's CBC data (macrocytic anemia and thrombocytopenia), I would recommend a bone marrow biopsy to assess for marrow involvement by CLL/SLL.    12/23/2021 Imaging   EXAM: CT CHEST, ABDOMEN, AND PELVIS WITH CONTRAST  IMPRESSION: 1. Slight interval enlargement of a presacral soft tissue mass measuring 7.2 x 4.7 cm, previously 6.9 x 4.1 cm on prior MR of the pelvis dated 07/04/2021. By report, this represents a biopsy proven lymphoma. 2.  Pleural nodule of the dependent right lower lobe overlying the posterior right tenth rib and pleural or paraspinous soft tissue mass overlying the right aspect of the T10 vertebral body, very slightly enlarged compared to prior examination of the chest dated 06/25/2020, consistent with additional sites of lymphomatous involvement given very indolent growth. These could be better assessed for metabolic activity by FDG PET/CT if desired. 3. There is mild, bibasilar predominant pulmonary fibrosis in a pattern featuring irregular peripheral interstitial opacity, septal thickening, but without clear evidence of subpleural bronchiolectasis or honeycombing, with a somewhat asymmetric distribution most conspicuously involving the right lower lobe and lingula. These findings are significantly worsened when compared to prior examination dated 06/25/2020, particularly in the right lower lobe. Given interval change, this may reflect sequelae of interval infection or aspiration, however appearance is generally suspicious for fibrotic interstitial lung disease, and if characterized by ATS pulmonary fibrosis is in an "indeterminate for UIP" pattern, differential considerations including both UIP and NSIP. 4. Emphysema.   Aortic Atherosclerosis (ICD10-I70.0) and Emphysema (ICD10-J43.9).   01/05/2022 Pathology Results   DIAGNOSIS:   BONE MARROW, ASPIRATE, CLOT, CORE:  -Hypercellular bone marrow for age with features of  myelodysplastic/myeloproliferative neoplasm  -Minor abnormal B-cell population  -See comment   PERIPHERAL BLOOD:  -Macrocytic anemia  -Neutrophilic left shift and monocytosis  -Thrombocytopenia   COMMENT:  The bone marrow is hypercellular for age with dyspoietic changes  involving myeloid cell lines associated with monocytosis and increased number of blastic cells (12%) as primarily seen  by morphology, many of which display monocytic features.  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  In this background, there are several predominantly small lymphoid aggregates mostly composed of small lymphoid cells.  By flow cytometry, a minor abnormal B-cell population expressing CD5 is seen and representing 2% of all cells.  This correlate with previously known B-cell lymphoproliferative process.  Correlation with cytogenetic and FISH studies is strongly recommended.    DIAGNOSIS:   -Increased number of monocytic cells present (25%)  -Minor abnormal B-cell population identified.  -See comment   COMMENT:  Flow cytometric analysis shows increased number of monocytic cells representing 25% of all cells but without aberrant phenotype or CD34 expression.  A significant CD34-positive blastic population is not identified.  The lymphoid population shows a minor B-cell population representing 2% of all cells and expressing B-cell antigens including CD20 associated with CD5, CD200 and possibly dim kappa expression.  The latter findings are abnormal and correlate with previously known B-cell lymphoproliferative process.  No significant T-cell phenotypic abnormalities identified.    01/12/2022 Initial Diagnosis   CMML (chronic myelomonocytic leukemia) ([facility])   01/12/2022 Cancer Staging   Staging form: Chronic Myeloid Leukemia, AJCC 8th Edition - Clinical stage from 01/12/2022: Bone marrow blast count (%): 12, Additional clonal changes: Unknown - Signed by Truitt Merle, MD on 01/12/2022 Stage prefix: Initial diagnosis   02/06/2022 -  Chemotherapy   Patient is on Treatment Plan : MYELODYSPLASIA  Azacitidine IV D1-7 q28d        INTERVAL HISTORY:  [name] is here for a follow up of CMML. She was last seen by NP Lacie on 07/24/22. She presents to the clinic accompanied by her husband. She reports "life is pretty  boring." She reports she is getting around better and doing well overall.   All other systems were reviewed with the patient and are negative.  MEDICAL HISTORY:  Past Medical History:  Diagnosis Date   Arthritis    Rheumatoid arthritis   Celiac disease    Chronic kidney disease    stage 3 from MD notes   COPD (chronic obstructive pulmonary disease) ([place])    Dyspnea    with going up stairs   Family history of adverse reaction to anesthesia    father had hard time waking up   Headache    sinus headaches   Hot flashes    Hypertension    Iron deficiency anemia    Pneumonia    per patient "I have walking pneumonia"    SURGICAL HISTORY: Past Surgical History:  Procedure Laterality Date   COLONOSCOPY     ECTOPIC PREGNANCY SURGERY      x 2   IR IMAGING GUIDED PORT INSERTION  01/31/2022   REVERSE SHOULDER ARTHROPLASTY Right 02/02/2017   Procedure: RIGHT REVERSE SHOULDER ARTHROPLASTY;  Surgeon: Netta Cedars, MD;  Location: [facility];  Service: Orthopedics;  Laterality: Right;    I have reviewed the social history and family history with the patient and they are unchanged from previous note.  ALLERGIES:  is allergic to digoxin and related, gluten meal, penicillins, alendronate, and hydroxychloroquine.  MEDICATIONS:  Current Outpatient Medications  Medication Sig Dispense Refill   denosumab (PROLIA) 60 MG/ML SOSY injection Inject 60 mg into the skin every 6 (six) months.     dicyclomine (BENTYL) 10 MG capsule Take 1 capsule (10 mg total) by mouth 3 (three)  times daily as needed for spasms. (Patient not taking: Reported on 06/29/2022) 30 capsule 0   diltiazem (CARDIZEM CD) 240 MG 24 hr capsule Take 1 capsule (240 mg total) by mouth daily. 30 capsule 1   furosemide (LASIX) 20 MG tablet Take 1 tablet (20 mg total) by mouth daily. 30 tablet 11   gabapentin (NEURONTIN) 300 MG capsule Take 300 mg by mouth in the morning.     InFLIXimab (REMICADE IV) Inject 10 mg into the vein every 2 (two)  months.     Metoprolol Tartrate 75 MG TABS Take 150 mg by mouth 2 (two) times daily. 360 tablet 3   potassium chloride SA (KLOR-CON M20) 20 MEQ tablet Take 1 tablet (20 mEq total) by mouth daily. Take with furosemide 90 tablet 3   predniSONE (DELTASONE) 5 MG tablet Take 5 mg by mouth daily with breakfast.     Tiotropium Bromide-Olodaterol (STIOLTO RESPIMAT) 2.5-2.5 MCG/ACT AERS Inhale 2 puffs into the lungs daily. 4 g 5   No current facility-administered medications for this visit.   Facility-Administered Medications Ordered in Other Visits  Medication Dose Route Frequency Provider Last Rate Last Admin   acetaminophen (TYLENOL) 325 MG tablet            diphenhydrAMINE (BENADRYL) 25 mg capsule            sodium chloride flush (NS) 0.9 % injection 10 mL  10 mL Intracatheter PRN Truitt Merle, MD   10 mL at 03/14/22 1709    PHYSICAL EXAMINATION: ECOG PERFORMANCE STATUS: 3 - Symptomatic, >50% confined to bed  Vitals:   08/07/22 1122  BP: 114/74  Pulse: 97  Resp: 14  Temp: 97.6 F (36.4 C)  SpO2: 99%   Wt Readings from Last 3 Encounters:  08/07/22 116 lb 11.2 oz (52.9 kg)  07/24/22 111 lb 8 oz (50.6 kg)  07/14/22 114 lb 12 oz (52.1 kg)     GENERAL:alert, no distress and comfortable SKIN: skin color normal, no rashes or significant lesions EYES: normal, Conjunctiva are pink and non-injected, sclera clear  NEURO: alert & oriented x 3 with fluent speech  LABORATORY DATA:  I have reviewed the data as listed    Latest Ref Rng & Units 08/07/2022   11:02 AM 08/04/2022   11:00 AM 07/31/2022   11:37 AM  CBC  WBC 4.0 - 10.5 K/uL 5.7  6.0  4.7   Hemoglobin 12.0 - 15.0 g/dL 9.6  9.6  9.5   Hematocrit 36.0 - 46.0 % 29.1  29.1  28.6   Platelets 150 - 400 K/uL 51  32  29         Latest Ref Rng & Units 08/07/2022   11:02 AM 07/31/2022   11:37 AM 07/26/2022   12:50 PM  CMP  Glucose 70 - 99 mg/dL 112  149  173   BUN 8 - 23 mg/dL 18  22  23    Creatinine 0.44 - 1.00 mg/dL 0.89  0.84  0.97    Sodium 135 - 145 mmol/L 137  136  137   Potassium 3.5 - 5.1 mmol/L 3.7  4.2  4.1   Chloride 98 - 111 mmol/L 99  101  101   CO2 22 - 32 mmol/L 32  30  28   Calcium 8.9 - 10.3 mg/dL 9.6  8.4  8.8   Total Protein 6.5 - 8.1 g/dL 6.0  5.7  5.4   Total Bilirubin 0.3 - 1.2 mg/dL 0.9  0.9  0.8  Alkaline Phos 38 - 126 U/L 44  45  44   AST 15 - 41 U/L 15  17  15    ALT 0 - 44 U/L 17  18  18        RADIOGRAPHIC STUDIES: I have personally reviewed the radiological images as listed and agreed with the findings in the report. No results found.    No orders of the defined types were placed in this encounter.  All questions were answered. The patient knows to call the clinic with any problems, questions or concerns. No barriers to learning was detected. The total time spent in the appointment was 30 minutes.     Truitt Merle, MD 08/07/2022   I, Wilburn Mylar, am acting as scribe for Truitt Merle, MD.   I have reviewed the above documentation for accuracy and completeness, and I agree with the above.

## 2022-08-07 NOTE — Progress Notes (Signed)
Nutrition follow-up completed with patient during infusion for CMML.  Weight was documented as 116 pounds 11 ounces on August 14 this is improved from 111 pounds 8 ounces on July 31. Patient states her appetite has improved.  She is having some taste alterations but these also seem to be getting better. She drinks 1 Ensure plus nutrition shake daily. She tries to drink more water but it is a struggle. Patient has some lower extremity edema.  Nutrition diagnosis: Inadequate oral intake improved  Intervention: Encouraged patient to continue strategies for increasing calories and protein and having between meal snacks. Continue a minimum of 1 carton of Ensure Plus high-protein daily. Work on continuing to increase fluids.  Monitoring, evaluation, goals: Patient will tolerate increased calories and protein to minimize loss of lean body mass.  Next visit: To be scheduled as needed.  **Disclaimer: This note was dictated with voice recognition software. Similar sounding words can inadvertently be transcribed and this note may contain transcription errors which may not have been corrected upon publication of note.**

## 2022-08-08 ENCOUNTER — Inpatient Hospital Stay: Payer: Medicare Other

## 2022-08-08 VITALS — BP 116/68 | HR 89 | Temp 98.3°F | Resp 18

## 2022-08-08 DIAGNOSIS — C931 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: Secondary | ICD-10-CM

## 2022-08-08 DIAGNOSIS — Z79899 Other long term (current) drug therapy: Secondary | ICD-10-CM | POA: Diagnosis not present

## 2022-08-08 DIAGNOSIS — Z5111 Encounter for antineoplastic chemotherapy: Secondary | ICD-10-CM | POA: Diagnosis not present

## 2022-08-08 MED ORDER — HEPARIN SOD (PORK) LOCK FLUSH 100 UNIT/ML IV SOLN
500.0000 [IU] | Freq: Once | INTRAVENOUS | Status: AC | PRN
  Administered 2022-08-08: 500 [IU]

## 2022-08-08 MED ORDER — SODIUM CHLORIDE 0.9 % IV SOLN
75.0000 mg/m2 | Freq: Once | INTRAVENOUS | Status: AC
  Administered 2022-08-08: 120 mg via INTRAVENOUS
  Filled 2022-08-08: qty 12

## 2022-08-08 MED ORDER — SODIUM CHLORIDE 0.9% FLUSH
10.0000 mL | INTRAVENOUS | Status: DC | PRN
  Administered 2022-08-08: 10 mL

## 2022-08-08 MED ORDER — SODIUM CHLORIDE 0.9 % IV SOLN
10.0000 mg | Freq: Once | INTRAVENOUS | Status: AC
  Administered 2022-08-08: 10 mg via INTRAVENOUS
  Filled 2022-08-08: qty 10

## 2022-08-08 MED ORDER — SODIUM CHLORIDE 0.9 % IV SOLN: Freq: Once | INTRAVENOUS | Status: AC

## 2022-08-08 MED FILL — Dexamethasone Sodium Phosphate Inj 100 MG/10ML: INTRAMUSCULAR | Qty: 1 | Status: AC

## 2022-08-08 NOTE — Patient Instructions (Signed)
[facility]  Discharge Instructions: Thank you for choosing [facility] to provide your oncology and hematology care.   If you have a lab appointment with the [facility], please go directly to the [facility] and check in at the registration area.   Wear comfortable clothing and clothing appropriate for easy access to any Portacath or PICC line.   We strive to give you quality time with your provider. You may need to reschedule your appointment if you arrive late (15 or more minutes).  Arriving late affects you and other patients whose appointments are after yours.  Also, if you miss three or more appointments without notifying the office, you may be dismissed from the clinic at the provider's discretion.      For prescription refill requests, have your pharmacy contact our office and allow 72 hours for refills to be completed.    Today you received the following chemotherapy and/or immunotherapy agents: Vidaza      To help prevent nausea and vomiting after your treatment, we encourage you to take your nausea medication as directed.  BELOW ARE SYMPTOMS THAT SHOULD BE REPORTED IMMEDIATELY: *FEVER GREATER THAN 100.4 F (38 C) OR HIGHER *CHILLS OR SWEATING *NAUSEA AND VOMITING THAT IS NOT CONTROLLED WITH YOUR NAUSEA MEDICATION *UNUSUAL SHORTNESS OF BREATH *UNUSUAL BRUISING OR BLEEDING *URINARY PROBLEMS (pain or burning when urinating, or frequent urination) *BOWEL PROBLEMS (unusual diarrhea, constipation, pain near the anus) TENDERNESS IN MOUTH AND THROAT WITH OR WITHOUT PRESENCE OF ULCERS (sore throat, sores in mouth, or a toothache) UNUSUAL RASH, SWELLING OR PAIN  UNUSUAL VAGINAL DISCHARGE OR ITCHING   Items with * indicate a potential emergency and should be followed up as soon as possible or go to the Emergency Department if any problems should occur.  Please show the CHEMOTHERAPY ALERT CARD or IMMUNOTHERAPY ALERT CARD at check-in to the  Emergency Department and triage nurse.  Should you have questions after your visit or need to cancel or reschedule your appointment, please contact [facility]  Dept: [phone]  and follow the prompts.  Office hours are 8:00 a.m. to 4:30 p.m. Monday - Friday. Please note that voicemails left after 4:00 p.m. may not be returned until the following business day.  We are closed weekends and major holidays. You have access to a nurse at all times for urgent questions. Please call the main number to the clinic Dept: [phone] and follow the prompts.   For any non-urgent questions, you may also contact your provider using MyChart. We now offer e-Visits for anyone 76 and older to request care online for non-urgent symptoms. For details visit [email or web address].   Also download the MyChart app! Go to the app store, search "MyChart", open the app, select [facility], and log in with your MyChart username and password.  Masks are optional in the cancer centers. If you would like for your care team to wear a mask while they are taking care of you, please let them know. You may have one support person who is at least 76 years old accompany you for your appointments.

## 2022-08-09 ENCOUNTER — Inpatient Hospital Stay: Payer: Medicare Other

## 2022-08-09 ENCOUNTER — Other Ambulatory Visit: Payer: Self-pay

## 2022-08-09 VITALS — BP 112/76 | HR 88 | Resp 17

## 2022-08-09 DIAGNOSIS — Z79899 Other long term (current) drug therapy: Secondary | ICD-10-CM | POA: Diagnosis not present

## 2022-08-09 DIAGNOSIS — C931 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: Secondary | ICD-10-CM

## 2022-08-09 DIAGNOSIS — Z5111 Encounter for antineoplastic chemotherapy: Secondary | ICD-10-CM | POA: Diagnosis not present

## 2022-08-09 MED ORDER — SODIUM CHLORIDE 0.9% FLUSH
10.0000 mL | INTRAVENOUS | Status: DC | PRN
  Administered 2022-08-09: 10 mL

## 2022-08-09 MED ORDER — SODIUM CHLORIDE 0.9 % IV SOLN
10.0000 mg | Freq: Once | INTRAVENOUS | Status: AC
  Administered 2022-08-09: 10 mg via INTRAVENOUS
  Filled 2022-08-09: qty 10

## 2022-08-09 MED ORDER — PALONOSETRON HCL INJECTION 0.25 MG/5ML
0.2500 mg | Freq: Once | INTRAVENOUS | Status: AC
  Administered 2022-08-09: 0.25 mg via INTRAVENOUS

## 2022-08-09 MED ORDER — SODIUM CHLORIDE 0.9 % IV SOLN: Freq: Once | INTRAVENOUS | Status: AC

## 2022-08-09 MED ORDER — HEPARIN SOD (PORK) LOCK FLUSH 100 UNIT/ML IV SOLN
500.0000 [IU] | Freq: Once | INTRAVENOUS | Status: AC | PRN
  Administered 2022-08-09: 500 [IU]

## 2022-08-09 MED ORDER — SODIUM CHLORIDE 0.9 % IV SOLN
75.0000 mg/m2 | Freq: Once | INTRAVENOUS | Status: AC
  Administered 2022-08-09: 120 mg via INTRAVENOUS
  Filled 2022-08-09: qty 12

## 2022-08-09 MED FILL — Dexamethasone Sodium Phosphate Inj 100 MG/10ML: INTRAMUSCULAR | Qty: 1 | Status: AC

## 2022-08-09 NOTE — Progress Notes (Signed)
Pt states she has bilateral lower leg edema that has been occurring for a few weeks, MD aware, ok to proceed.

## 2022-08-09 NOTE — Patient Instructions (Signed)
[facility]  Discharge Instructions: Thank you for choosing [facility] to provide your oncology and hematology care.   If you have a lab appointment with the [facility], please go directly to the [facility] and check in at the registration area.   Wear comfortable clothing and clothing appropriate for easy access to any Portacath or PICC line.   We strive to give you quality time with your provider. You may need to reschedule your appointment if you arrive late (15 or more minutes).  Arriving late affects you and other patients whose appointments are after yours.  Also, if you miss three or more appointments without notifying the office, you may be dismissed from the clinic at the provider's discretion.      For prescription refill requests, have your pharmacy contact our office and allow 72 hours for refills to be completed.    Today you received the following chemotherapy and/or immunotherapy agents: Vidaza      To help prevent nausea and vomiting after your treatment, we encourage you to take your nausea medication as directed.  BELOW ARE SYMPTOMS THAT SHOULD BE REPORTED IMMEDIATELY: *FEVER GREATER THAN 100.4 F (38 C) OR HIGHER *CHILLS OR SWEATING *NAUSEA AND VOMITING THAT IS NOT CONTROLLED WITH YOUR NAUSEA MEDICATION *UNUSUAL SHORTNESS OF BREATH *UNUSUAL BRUISING OR BLEEDING *URINARY PROBLEMS (pain or burning when urinating, or frequent urination) *BOWEL PROBLEMS (unusual diarrhea, constipation, pain near the anus) TENDERNESS IN MOUTH AND THROAT WITH OR WITHOUT PRESENCE OF ULCERS (sore throat, sores in mouth, or a toothache) UNUSUAL RASH, SWELLING OR PAIN  UNUSUAL VAGINAL DISCHARGE OR ITCHING   Items with * indicate a potential emergency and should be followed up as soon as possible or go to the Emergency Department if any problems should occur.  Please show the CHEMOTHERAPY ALERT CARD or IMMUNOTHERAPY ALERT CARD at check-in to the  Emergency Department and triage nurse.  Should you have questions after your visit or need to cancel or reschedule your appointment, please contact [facility]  Dept: [phone]  and follow the prompts.  Office hours are 8:00 a.m. to 4:30 p.m. Monday - Friday. Please note that voicemails left after 4:00 p.m. may not be returned until the following business day.  We are closed weekends and major holidays. You have access to a nurse at all times for urgent questions. Please call the main number to the clinic Dept: [phone] and follow the prompts.   For any non-urgent questions, you may also contact your provider using MyChart. We now offer e-Visits for anyone 57 and older to request care online for non-urgent symptoms. For details visit [email or web address].   Also download the MyChart app! Go to the app store, search "MyChart", open the app, select [facility], and log in with your MyChart username and password.  Masks are optional in the cancer centers. If you would like for your care team to wear a mask while they are taking care of you, please let them know. You may have one support person who is at least 76 years old accompany you for your appointments.

## 2022-08-10 ENCOUNTER — Inpatient Hospital Stay: Payer: Medicare Other

## 2022-08-10 VITALS — BP 122/80 | HR 96 | Temp 98.6°F | Resp 18

## 2022-08-10 DIAGNOSIS — Z5111 Encounter for antineoplastic chemotherapy: Secondary | ICD-10-CM | POA: Diagnosis not present

## 2022-08-10 DIAGNOSIS — C931 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: Secondary | ICD-10-CM | POA: Diagnosis not present

## 2022-08-10 DIAGNOSIS — Z79899 Other long term (current) drug therapy: Secondary | ICD-10-CM | POA: Diagnosis not present

## 2022-08-10 MED ORDER — HEPARIN SOD (PORK) LOCK FLUSH 100 UNIT/ML IV SOLN
500.0000 [IU] | Freq: Once | INTRAVENOUS | Status: AC | PRN
  Administered 2022-08-10: 500 [IU]

## 2022-08-10 MED ORDER — SODIUM CHLORIDE 0.9 % IV SOLN: Freq: Once | INTRAVENOUS | Status: AC

## 2022-08-10 MED ORDER — SODIUM CHLORIDE 0.9% FLUSH
10.0000 mL | INTRAVENOUS | Status: DC | PRN
  Administered 2022-08-10: 10 mL

## 2022-08-10 MED ORDER — SODIUM CHLORIDE 0.9 % IV SOLN
75.0000 mg/m2 | Freq: Once | INTRAVENOUS | Status: AC
  Administered 2022-08-10: 120 mg via INTRAVENOUS
  Filled 2022-08-10: qty 12

## 2022-08-10 MED ORDER — SODIUM CHLORIDE 0.9 % IV SOLN
10.0000 mg | Freq: Once | INTRAVENOUS | Status: AC
  Administered 2022-08-10: 10 mg via INTRAVENOUS
  Filled 2022-08-10: qty 10

## 2022-08-10 MED FILL — Dexamethasone Sodium Phosphate Inj 100 MG/10ML: INTRAMUSCULAR | Qty: 1 | Status: AC

## 2022-08-10 NOTE — Patient Instructions (Signed)
[facility]  Discharge Instructions: Thank you for choosing [facility] to provide your oncology and hematology care.   If you have a lab appointment with the [facility], please go directly to the [facility] and check in at the registration area.   Wear comfortable clothing and clothing appropriate for easy access to any Portacath or PICC line.   We strive to give you quality time with your provider. You may need to reschedule your appointment if you arrive late (15 or more minutes).  Arriving late affects you and other patients whose appointments are after yours.  Also, if you miss three or more appointments without notifying the office, you may be dismissed from the clinic at the provider's discretion.      For prescription refill requests, have your pharmacy contact our office and allow 72 hours for refills to be completed.    Today you received the following chemotherapy and/or immunotherapy agents: Vidaza      To help prevent nausea and vomiting after your treatment, we encourage you to take your nausea medication as directed.  BELOW ARE SYMPTOMS THAT SHOULD BE REPORTED IMMEDIATELY: *FEVER GREATER THAN 100.4 F (38 C) OR HIGHER *CHILLS OR SWEATING *NAUSEA AND VOMITING THAT IS NOT CONTROLLED WITH YOUR NAUSEA MEDICATION *UNUSUAL SHORTNESS OF BREATH *UNUSUAL BRUISING OR BLEEDING *URINARY PROBLEMS (pain or burning when urinating, or frequent urination) *BOWEL PROBLEMS (unusual diarrhea, constipation, pain near the anus) TENDERNESS IN MOUTH AND THROAT WITH OR WITHOUT PRESENCE OF ULCERS (sore throat, sores in mouth, or a toothache) UNUSUAL RASH, SWELLING OR PAIN  UNUSUAL VAGINAL DISCHARGE OR ITCHING   Items with * indicate a potential emergency and should be followed up as soon as possible or go to the Emergency Department if any problems should occur.  Please show the CHEMOTHERAPY ALERT CARD or IMMUNOTHERAPY ALERT CARD at check-in to the  Emergency Department and triage nurse.  Should you have questions after your visit or need to cancel or reschedule your appointment, please contact [facility]  Dept: [phone]  and follow the prompts.  Office hours are 8:00 a.m. to 4:30 p.m. Monday - Friday. Please note that voicemails left after 4:00 p.m. may not be returned until the following business day.  We are closed weekends and major holidays. You have access to a nurse at all times for urgent questions. Please call the main number to the clinic Dept: [phone] and follow the prompts.   For any non-urgent questions, you may also contact your provider using MyChart. We now offer e-Visits for anyone 38 and older to request care online for non-urgent symptoms. For details visit [email or web address].   Also download the MyChart app! Go to the app store, search "MyChart", open the app, select [facility], and log in with your MyChart username and password.  Masks are optional in the cancer centers. If you would like for your care team to wear a mask while they are taking care of you, please let them know. You may have one support person who is at least 76 years old accompany you for your appointments.

## 2022-08-11 ENCOUNTER — Inpatient Hospital Stay: Payer: Medicare Other

## 2022-08-11 ENCOUNTER — Other Ambulatory Visit: Payer: Self-pay

## 2022-08-11 VITALS — BP 127/71 | HR 97 | Temp 97.9°F | Resp 18

## 2022-08-11 DIAGNOSIS — Z5111 Encounter for antineoplastic chemotherapy: Secondary | ICD-10-CM | POA: Diagnosis not present

## 2022-08-11 DIAGNOSIS — C931 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: Secondary | ICD-10-CM | POA: Diagnosis not present

## 2022-08-11 DIAGNOSIS — Z79899 Other long term (current) drug therapy: Secondary | ICD-10-CM | POA: Diagnosis not present

## 2022-08-11 MED ORDER — SODIUM CHLORIDE 0.9 % IV SOLN
75.0000 mg/m2 | Freq: Once | INTRAVENOUS | Status: AC
  Administered 2022-08-11: 120 mg via INTRAVENOUS
  Filled 2022-08-11: qty 12

## 2022-08-11 MED ORDER — SODIUM CHLORIDE 0.9 % IV SOLN
10.0000 mg | Freq: Once | INTRAVENOUS | Status: AC
  Administered 2022-08-11: 10 mg via INTRAVENOUS
  Filled 2022-08-11: qty 10

## 2022-08-11 MED ORDER — HEPARIN SOD (PORK) LOCK FLUSH 100 UNIT/ML IV SOLN
500.0000 [IU] | Freq: Once | INTRAVENOUS | Status: AC | PRN
  Administered 2022-08-11: 500 [IU]

## 2022-08-11 MED ORDER — PALONOSETRON HCL INJECTION 0.25 MG/5ML
0.2500 mg | Freq: Once | INTRAVENOUS | Status: AC
  Administered 2022-08-11: 0.25 mg via INTRAVENOUS
  Filled 2022-08-11: qty 5

## 2022-08-11 MED ORDER — SODIUM CHLORIDE 0.9% FLUSH
10.0000 mL | INTRAVENOUS | Status: DC | PRN
  Administered 2022-08-11: 10 mL

## 2022-08-11 NOTE — Patient Instructions (Signed)
[facility]  Discharge Instructions: Thank you for choosing [facility] to provide your oncology and hematology care.   If you have a lab appointment with the [facility], please go directly to the [facility] and check in at the registration area.   Wear comfortable clothing and clothing appropriate for easy access to any Portacath or PICC line.   We strive to give you quality time with your provider. You may need to reschedule your appointment if you arrive late (15 or more minutes).  Arriving late affects you and other patients whose appointments are after yours.  Also, if you miss three or more appointments without notifying the office, you may be dismissed from the clinic at the provider's discretion.      For prescription refill requests, have your pharmacy contact our office and allow 72 hours for refills to be completed.    Today you received the following chemotherapy and/or immunotherapy agents vidaza       To help prevent nausea and vomiting after your treatment, we encourage you to take your nausea medication as directed.  BELOW ARE SYMPTOMS THAT SHOULD BE REPORTED IMMEDIATELY: *FEVER GREATER THAN 100.4 F (38 C) OR HIGHER *CHILLS OR SWEATING *NAUSEA AND VOMITING THAT IS NOT CONTROLLED WITH YOUR NAUSEA MEDICATION *UNUSUAL SHORTNESS OF BREATH *UNUSUAL BRUISING OR BLEEDING *URINARY PROBLEMS (pain or burning when urinating, or frequent urination) *BOWEL PROBLEMS (unusual diarrhea, constipation, pain near the anus) TENDERNESS IN MOUTH AND THROAT WITH OR WITHOUT PRESENCE OF ULCERS (sore throat, sores in mouth, or a toothache) UNUSUAL RASH, SWELLING OR PAIN  UNUSUAL VAGINAL DISCHARGE OR ITCHING   Items with * indicate a potential emergency and should be followed up as soon as possible or go to the Emergency Department if any problems should occur.  Please show the CHEMOTHERAPY ALERT CARD or IMMUNOTHERAPY ALERT CARD at check-in to  the Emergency Department and triage nurse.  Should you have questions after your visit or need to cancel or reschedule your appointment, please contact [facility]  Dept: [phone]  and follow the prompts.  Office hours are 8:00 a.m. to 4:30 p.m. Monday - Friday. Please note that voicemails left after 4:00 p.m. may not be returned until the following business day.  We are closed weekends and major holidays. You have access to a nurse at all times for urgent questions. Please call the main number to the clinic Dept: [phone] and follow the prompts.   For any non-urgent questions, you may also contact your provider using MyChart. We now offer e-Visits for anyone 35 and older to request care online for non-urgent symptoms. For details visit [email or web address].   Also download the MyChart app! Go to the app store, search "MyChart", open the app, select [facility], and log in with your MyChart username and password.  Masks are optional in the cancer centers. If you would like for your care team to wear a mask while they are taking care of you, please let them know. You may have one support person who is at least 76 years old accompany you for your appointments.

## 2022-08-20 ENCOUNTER — Telehealth: Payer: Self-pay | Admitting: Physician Assistant

## 2022-08-20 MED ORDER — DILTIAZEM HCL ER COATED BEADS 240 MG PO CP24: 240.0000 mg | ORAL_CAPSULE | Freq: Every day | ORAL | 6 refills | Status: DC

## 2022-08-20 NOTE — Telephone Encounter (Signed)
   Pt called because she is out of her Cardizem CD 240 mg.   I was filled at her hospitalization in June with 1 refill.  She has done well on this dose, no changes when she saw Dr Acie Fredrickson in July.   Med refilled x 6 months.  Keep upcoming appt with Dr Acie Fredrickson,  as scheduled.  Rosaria Ferries, PA-C 08/20/2022 12:33 PM

## 2022-08-23 DIAGNOSIS — M0579 Rheumatoid arthritis with rheumatoid factor of multiple sites without organ or systems involvement: Secondary | ICD-10-CM | POA: Diagnosis not present

## 2022-08-24 DIAGNOSIS — C931 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: Secondary | ICD-10-CM | POA: Diagnosis not present

## 2022-08-24 DIAGNOSIS — Z79899 Other long term (current) drug therapy: Secondary | ICD-10-CM | POA: Diagnosis not present

## 2022-08-25 ENCOUNTER — Other Ambulatory Visit: Payer: Self-pay | Admitting: Internal Medicine

## 2022-08-25 ENCOUNTER — Ambulatory Visit
Admission: RE | Admit: 2022-08-25 | Discharge: 2022-08-25 | Disposition: A | Discharge status: Home / Self Care | Payer: Medicare Other | Source: Ambulatory Visit | Attending: Internal Medicine | Admitting: Internal Medicine

## 2022-08-25 DIAGNOSIS — S9031XA Contusion of right foot, initial encounter: Secondary | ICD-10-CM

## 2022-08-25 DIAGNOSIS — R296 Repeated falls: Secondary | ICD-10-CM | POA: Diagnosis not present

## 2022-08-25 DIAGNOSIS — L98491 Non-pressure chronic ulcer of skin of other sites limited to breakdown of skin: Secondary | ICD-10-CM | POA: Diagnosis not present

## 2022-08-25 DIAGNOSIS — M7989 Other specified soft tissue disorders: Secondary | ICD-10-CM | POA: Diagnosis not present

## 2022-08-25 DIAGNOSIS — M170 Bilateral primary osteoarthritis of knee: Secondary | ICD-10-CM | POA: Diagnosis not present

## 2022-08-28 ENCOUNTER — Encounter: Payer: Self-pay | Admitting: Cardiovascular Disease

## 2022-08-28 NOTE — Progress Notes (Signed)
Cardiology Office Note:    Date:  08/29/2022   ID:  [name], DOB 03/13/1946, MRN [id number]  PCP:  [name], MD   [facility] Providers Cardiologist:  Mertie Moores, MD     Referring MD: [name], MD   Chief Complaint  Patient presents with   Atrial Fibrillation         Oct. 11, 2022   [name] is a 76 y.o. female with a hx of back pain,  CKD, HLD, COPD, paroxysmal A. fib.  We are asked to see her today by Dr. Laurann Montana for further evaluation and management of her paroxysmal atrial fibrillation.  She has not yet been started on anticoagulation. CHADS2VASC is 79 ( female, age, HTN)   No hx of CVA, CHF, DM , no vascular disease    Asymptomatic  Cannot tell when her HR is irreg.  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 Laurann Montana indicate a previous GI bleed and colonoscopy with Dr Collene Mares - she does not recall this .   Jan. 17, 2023 : Seen for her PAF  Is due for her colonoscopy   Instructed her to hold eliquis for 2 days  No symptoms that she could say is Afib   Apr 28, 2022:  [name] is seen for follow up of her PAF. 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 Hx of COPD   Was in the hospital 2 weeks ago .  Found to have severe anemia and rapid afib .  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 5, 2023 [name] is seen for follow up of her atrial fib,  anemia, failure to  thrive, COPD,  CML Wt is 114 lbs.   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       Past Medical History:  Diagnosis Date   Arthritis    Rheumatoid arthritis   Celiac disease    Chronic kidney disease    stage 3 from MD notes   COPD (chronic obstructive pulmonary disease) (HCC)    Dyspnea    with going up stairs   Family history of adverse reaction to anesthesia    father had hard time waking up   Headache    sinus headaches   Hot flashes    Hypertension    Iron deficiency anemia    Pneumonia    per patient "I have walking pneumonia"    Past Surgical History:  Procedure Laterality Date   COLONOSCOPY     ECTOPIC PREGNANCY SURGERY      x 2   IR IMAGING GUIDED PORT INSERTION  01/31/2022   REVERSE SHOULDER ARTHROPLASTY Right 02/02/2017  Procedure: RIGHT REVERSE SHOULDER ARTHROPLASTY;  Surgeon: Netta Cedars, MD;  Location: [facility];  Service: Orthopedics;  Laterality: Right;    Current Medications: Current Meds  Medication Sig   denosumab (PROLIA) 60 MG/ML SOSY injection Inject 60 mg into the skin every 6 (six) months.   diltiazem (CARDIZEM CD) 240 MG 24 hr capsule Take 1 capsule (240 mg total) by mouth daily.   furosemide (LASIX) 20 MG tablet Take 1 tablet (20 mg total) by mouth daily.   gabapentin (NEURONTIN) 300 MG capsule Take 300 mg by mouth in the morning.   InFLIXimab (REMICADE IV) Inject 10 mg into the vein every 2 (two) months.   losartan (COZAAR) 50 MG tablet Take 50 mg by mouth daily.   Metoprolol Tartrate 75 MG TABS Take 150 mg by mouth 2 (two) times daily.   potassium chloride SA (KLOR-CON M20) 20 MEQ tablet Take 1 tablet (20 mEq total) by mouth daily. Take with furosemide   predniSONE (DELTASONE) 5 MG tablet Take 5 mg by mouth daily with breakfast.   Tiotropium Bromide-Olodaterol (STIOLTO RESPIMAT) 2.5-2.5 MCG/ACT AERS Inhale 2 puffs into the lungs daily.      Allergies:   Digoxin and related, Gluten meal, Penicillins, Alendronate, and Hydroxychloroquine   Social History   Socioeconomic History   Marital status: Married    Spouse name: Not on file   Number of children: Not on file   Years of education: Not on file   Highest education level: Not on file  Occupational History   Not on file  Tobacco Use   Smoking status: Former    Packs/day: 1.00    Years: 30.00    Total pack years: 30.00    Types: Cigarettes    Quit date: 2005    Years since quitting: 18.6   Smokeless tobacco: Never  Vaping Use   Vaping Use: Never used  Substance and Sexual Activity   Alcohol use: Yes    Alcohol/week: 12.0 - 14.0 standard drinks of alcohol    Types: 12 - 14 Glasses of wine per week   Drug use: No   Sexual activity: Not Currently  Other Topics Concern   Not on file  Social History Narrative   Not on file   Social Determinants of Health   Financial Resource Strain: Not on file  Food Insecurity: Not on file  Transportation Needs: Not on file  Physical Activity: Not on file  Stress: Not on file  Social Connections: Not on file     Family History: The patient's family history includes Diabetes in her father; Peripheral Artery Disease in her father.  ROS:   Please see the history of present illness.     All other systems reviewed and are negative.  EKGs/Labs/Other Studies Reviewed:    The following studies were reviewed today: - previous ecgs     Recent Labs: 03/29/2022: TSH 0.731 05/21/2022: B Natriuretic Peptide 148.4 06/21/2022: Magnesium 1.8 08/07/2022: ALT 17; BUN 18; Creatinine 0.89; Hemoglobin 9.6; Platelets 51; Potassium 3.7; Sodium 137  Recent Lipid Panel No results found for: "CHOL", "TRIG", "HDL", "CHOLHDL", "VLDL", "LDLCALC", "LDLDIRECT"   Risk Assessment/Calculations:    CHA2DS2-VASc Score = 4  } This indicates a 4.8% annual risk of stroke. The patient's score is based upon: CHF History: 0 HTN History:  1 Diabetes History: 0 Stroke History: 0 Vascular Disease History: 0 Age Score: 2 Gender Score: 1         Physical Exam:      Physical Exam:  Blood pressure 104/60, pulse 77, height 5' 3"  (1.6 m), weight 114 lb 12.8 oz (52.1 kg), SpO2 97 %.       GEN: Chronically ill-appearing patient, examined in the wheelchair in no acute distress HEENT: Normal NECK: No JVD; No carotid bruits LYMPHATICS: No lymphadenopathy CARDIAC:  irreg. Irreg  RESPIRATORY:  Clear to auscultation without rales, wheezing or rhonchi  ABDOMEN: Soft, non-tender, non-distended MUSCULOSKELETAL:  2-3 + leg edema  Dorsum of her R foot is very bruised   SKIN: Warm and dry NEUROLOGIC:  Alert and oriented x 3   EKG:     ASSESSMENT:    1. Persistent atrial fibrillation ([facility])   2. Failure to thrive in adult       PLAN:      Atrial fib:   She is not a good candidate for anticoagulation given her recent acute anemia / unknown bleed. Remains in Afib. We are very limited in what we are able to do for her      2.  Leg edema:  she has very soft pitting edema in her legs.   He overall health is poor.   Not eating very well.   Spends much of the day in her chair  We discussed leg elevation, improving diet, avoiding salty foods ( she still eats out quite a bit)  Leg swelling improves overnight while she is in bed    3.  Generalized failure to thrive:  unclear reason why she has generalized failure to thrive.  ? Due to her CML  Gets chemo every 3 weeks which sets her back for a while   4.  Anemia :   was admitted to the hospital with acute weakness, hypotension fast HR . Was found to be anemic with a faling Hb.   Apparently no source of bleeding was found . Was transfused Stabilized Dilt CD was reduced              Medication Adjustments/Labs and Tests Ordered: Current medicines are reviewed at length with the patient today.  Concerns regarding medicines are outlined above.  No orders of the  defined types were placed in this encounter.   No orders of the defined types were placed in this encounter.    Patient Instructions  Medication Instructions:  Your physician recommends that you continue on your current medications as directed. Please refer to the Current Medication list given to you today.  *If you need a refill on your cardiac medications before your next appointment, please call your pharmacy*   Lab Work: NONE If you have labs (blood work) drawn today and your tests are completely normal, you will receive your results only by: [facility] (if you have MyChart) OR A paper copy in the mail If you have any lab test that is abnormal or we need to change your treatment, we will call you to review the results.   Testing/Procedures: NONE   Follow-Up: At [facility], you and your health needs are our priority.  As part of our continuing mission to provide you with exceptional heart care, we have created designated Provider Care Teams.  These Care Teams include your primary Cardiologist (physician) and Advanced Practice Providers (APPs -  Physician Assistants and Nurse Practitioners) who all work together to provide you with the care you need, when you need it.  We recommend signing up for the patient portal called "MyChart".  Sign up information is provided on this After Visit Summary.  MyChart is used  to connect with patients for Virtual Visits (Telemedicine).  Patients are able to view lab/test results, encounter notes, upcoming appointments, etc.  Non-urgent messages can be sent to your provider as well.   To learn more about what you can do with MyChart, go to [email or web address].    Your next appointment:   6 month(s)  The format for your next appointment:   In Person  Provider:   Mertie Moores, MD       Important Information About Sugar         Signed, Mertie Moores, MD  08/29/2022 5:47 PM    [facility]

## 2022-08-29 ENCOUNTER — Encounter: Payer: Self-pay | Admitting: Cardiovascular Disease

## 2022-08-29 ENCOUNTER — Ambulatory Visit: Payer: Medicare Other | Attending: Cardiovascular Disease | Admitting: Cardiovascular Disease

## 2022-08-29 VITALS — BP 104/60 | HR 77 | Ht 63.0 in | Wt 114.8 lb

## 2022-08-29 DIAGNOSIS — R627 Adult failure to thrive: Secondary | ICD-10-CM | POA: Diagnosis not present

## 2022-08-29 DIAGNOSIS — I4819 Other persistent atrial fibrillation: Secondary | ICD-10-CM | POA: Insufficient documentation

## 2022-08-29 NOTE — Patient Instructions (Signed)
Medication Instructions:  Your physician recommends that you continue on your current medications as directed. Please refer to the Current Medication list given to you today.  *If you need a refill on your cardiac medications before your next appointment, please call your pharmacy*   Lab Work: NONE If you have labs (blood work) drawn today and your tests are completely normal, you will receive your results only by: [facility] (if you have MyChart) OR A paper copy in the mail If you have any lab test that is abnormal or we need to change your treatment, we will call you to review the results.   Testing/Procedures: NONE   Follow-Up: At [facility], you and your health needs are our priority.  As part of our continuing mission to provide you with exceptional heart care, we have created designated Provider Care Teams.  These Care Teams include your primary Cardiologist (physician) and Advanced Practice Providers (APPs -  Physician Assistants and Nurse Practitioners) who all work together to provide you with the care you need, when you need it.  We recommend signing up for the patient portal called "MyChart".  Sign up information is provided on this After Visit Summary.  MyChart is used to connect with patients for Virtual Visits (Telemedicine).  Patients are able to view lab/test results, encounter notes, upcoming appointments, etc.  Non-urgent messages can be sent to your provider as well.   To learn more about what you can do with MyChart, go to [email or web address].    Your next appointment:   6 month(s)  The format for your next appointment:   In Person  Provider:   Mertie Moores, MD       Important Information About Sugar

## 2022-08-30 ENCOUNTER — Other Ambulatory Visit: Payer: Self-pay

## 2022-08-31 DIAGNOSIS — D631 Anemia in chronic kidney disease: Secondary | ICD-10-CM | POA: Diagnosis not present

## 2022-08-31 DIAGNOSIS — M810 Age-related osteoporosis without current pathological fracture: Secondary | ICD-10-CM | POA: Diagnosis not present

## 2022-08-31 DIAGNOSIS — Z96611 Presence of right artificial shoulder joint: Secondary | ICD-10-CM | POA: Diagnosis not present

## 2022-08-31 DIAGNOSIS — I4891 Unspecified atrial fibrillation: Secondary | ICD-10-CM | POA: Diagnosis not present

## 2022-08-31 DIAGNOSIS — M353 Polymyalgia rheumatica: Secondary | ICD-10-CM | POA: Diagnosis not present

## 2022-08-31 DIAGNOSIS — Z87891 Personal history of nicotine dependence: Secondary | ICD-10-CM | POA: Diagnosis not present

## 2022-08-31 DIAGNOSIS — M069 Rheumatoid arthritis, unspecified: Secondary | ICD-10-CM | POA: Diagnosis not present

## 2022-08-31 DIAGNOSIS — J449 Chronic obstructive pulmonary disease, unspecified: Secondary | ICD-10-CM | POA: Diagnosis not present

## 2022-08-31 DIAGNOSIS — N1831 Chronic kidney disease, stage 3a: Secondary | ICD-10-CM | POA: Diagnosis not present

## 2022-08-31 DIAGNOSIS — Z7952 Long term (current) use of systemic steroids: Secondary | ICD-10-CM | POA: Diagnosis not present

## 2022-08-31 DIAGNOSIS — Z9181 History of falling: Secondary | ICD-10-CM | POA: Diagnosis not present

## 2022-08-31 DIAGNOSIS — C9590 Leukemia, unspecified not having achieved remission: Secondary | ICD-10-CM | POA: Diagnosis not present

## 2022-08-31 DIAGNOSIS — E7800 Pure hypercholesterolemia, unspecified: Secondary | ICD-10-CM | POA: Diagnosis not present

## 2022-08-31 DIAGNOSIS — Z85828 Personal history of other malignant neoplasm of skin: Secondary | ICD-10-CM | POA: Diagnosis not present

## 2022-08-31 DIAGNOSIS — I5022 Chronic systolic (congestive) heart failure: Secondary | ICD-10-CM | POA: Diagnosis not present

## 2022-08-31 DIAGNOSIS — I130 Hypertensive heart and chronic kidney disease with heart failure and stage 1 through stage 4 chronic kidney disease, or unspecified chronic kidney disease: Secondary | ICD-10-CM | POA: Diagnosis not present

## 2022-08-31 DIAGNOSIS — S81811D Laceration without foreign body, right lower leg, subsequent encounter: Secondary | ICD-10-CM | POA: Diagnosis not present

## 2022-08-31 DIAGNOSIS — M170 Bilateral primary osteoarthritis of knee: Secondary | ICD-10-CM | POA: Diagnosis not present

## 2022-08-31 DIAGNOSIS — I1310 Hypertensive heart and chronic kidney disease without heart failure, with stage 1 through stage 4 chronic kidney disease, or unspecified chronic kidney disease: Secondary | ICD-10-CM | POA: Diagnosis not present

## 2022-09-01 ENCOUNTER — Other Ambulatory Visit: Payer: Self-pay | Admitting: Hematology

## 2022-09-01 DIAGNOSIS — C931 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: Secondary | ICD-10-CM

## 2022-09-04 ENCOUNTER — Other Ambulatory Visit: Payer: Self-pay

## 2022-09-04 ENCOUNTER — Encounter: Payer: Self-pay | Admitting: Hematology

## 2022-09-04 ENCOUNTER — Inpatient Hospital Stay: Payer: Medicare Other

## 2022-09-04 ENCOUNTER — Inpatient Hospital Stay: Payer: Medicare Other | Attending: Nurse Practitioner | Admitting: Hematology

## 2022-09-04 VITALS — BP 100/71 | HR 98 | Temp 99.0°F | Resp 15 | Ht 63.0 in | Wt 116.0 lb

## 2022-09-04 DIAGNOSIS — C931 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: Secondary | ICD-10-CM

## 2022-09-04 DIAGNOSIS — Z5111 Encounter for antineoplastic chemotherapy: Secondary | ICD-10-CM | POA: Insufficient documentation

## 2022-09-04 DIAGNOSIS — I4891 Unspecified atrial fibrillation: Secondary | ICD-10-CM | POA: Insufficient documentation

## 2022-09-04 DIAGNOSIS — J449 Chronic obstructive pulmonary disease, unspecified: Secondary | ICD-10-CM | POA: Diagnosis not present

## 2022-09-04 DIAGNOSIS — I110 Hypertensive heart disease with heart failure: Secondary | ICD-10-CM | POA: Insufficient documentation

## 2022-09-04 DIAGNOSIS — Z95828 Presence of other vascular implants and grafts: Secondary | ICD-10-CM

## 2022-09-04 DIAGNOSIS — Z79899 Other long term (current) drug therapy: Secondary | ICD-10-CM | POA: Diagnosis not present

## 2022-09-04 DIAGNOSIS — M069 Rheumatoid arthritis, unspecified: Secondary | ICD-10-CM | POA: Diagnosis not present

## 2022-09-04 DIAGNOSIS — I509 Heart failure, unspecified: Secondary | ICD-10-CM | POA: Diagnosis not present

## 2022-09-04 LAB — CBC WITH DIFFERENTIAL/PLATELET
Abs Immature Granulocytes: 0.07 10*3/uL (ref 0.00–0.07)
Basophils Absolute: 0.1 10*3/uL (ref 0.0–0.1)
Basophils Relative: 1 %
Eosinophils Absolute: 0 10*3/uL (ref 0.0–0.5)
Eosinophils Relative: 0 %
HCT: 26.3 % — ABNORMAL LOW (ref 36.0–46.0)
Hemoglobin: 8.3 g/dL — ABNORMAL LOW (ref 12.0–15.0)
Immature Granulocytes: 1 %
Lymphocytes Relative: 16 %
Lymphs Abs: 0.8 10*3/uL (ref 0.7–4.0)
MCH: 35.3 pg — ABNORMAL HIGH (ref 26.0–34.0)
MCHC: 31.6 g/dL (ref 30.0–36.0)
MCV: 111.9 fL — ABNORMAL HIGH (ref 80.0–100.0)
Monocytes Absolute: 1.5 10*3/uL — ABNORMAL HIGH (ref 0.1–1.0)
Monocytes Relative: 28 %
Neutro Abs: 2.9 10*3/uL (ref 1.7–7.7)
Neutrophils Relative %: 54 %
Platelets: 68 10*3/uL — ABNORMAL LOW (ref 150–400)
RBC: 2.35 MIL/uL — ABNORMAL LOW (ref 3.87–5.11)
RDW: 18.6 % — ABNORMAL HIGH (ref 11.5–15.5)
WBC: 5.4 10*3/uL (ref 4.0–10.5)
nRBC: 0.4 % — ABNORMAL HIGH (ref 0.0–0.2)

## 2022-09-04 LAB — CMP (CANCER CENTER ONLY)
ALT: 11 U/L (ref 0–44)
AST: 15 U/L (ref 15–41)
Albumin: 3.6 g/dL (ref 3.5–5.0)
Alkaline Phosphatase: 46 U/L (ref 38–126)
Anion gap: 5 (ref 5–15)
BUN: 24 mg/dL — ABNORMAL HIGH (ref 8–23)
CO2: 28 mmol/L (ref 22–32)
Calcium: 8.7 mg/dL — ABNORMAL LOW (ref 8.9–10.3)
Chloride: 102 mmol/L (ref 98–111)
Creatinine: 1.03 mg/dL — ABNORMAL HIGH (ref 0.44–1.00)
GFR, Estimated: 57 mL/min — ABNORMAL LOW (ref >60)
Glucose, Bld: 149 mg/dL — ABNORMAL HIGH (ref 70–99)
Potassium: 4 mmol/L (ref 3.5–5.1)
Sodium: 135 mmol/L (ref 135–145)
Total Bilirubin: 0.7 mg/dL (ref 0.3–1.2)
Total Protein: 5.5 g/dL — ABNORMAL LOW (ref 6.5–8.1)

## 2022-09-04 MED ORDER — SODIUM CHLORIDE 0.9% FLUSH
10.0000 mL | Freq: Once | INTRAVENOUS | Status: AC
  Administered 2022-09-04: 10 mL

## 2022-09-04 MED ORDER — HEPARIN SOD (PORK) LOCK FLUSH 100 UNIT/ML IV SOLN
500.0000 [IU] | INTRAVENOUS | Status: AC | PRN
  Administered 2022-09-04: 500 [IU]

## 2022-09-04 MED ORDER — MIRTAZAPINE 7.5 MG PO TABS: 7.5000 mg | ORAL_TABLET | Freq: Every day | ORAL | 0 refills | Status: DC

## 2022-09-04 NOTE — Progress Notes (Signed)
[facility]   [phone] [phone]   Clinic Follow up Note   Patient Care Team: [name], MD as PCP - General (Family Medicine) [name], Wonda Cheng, MD as PCP - Cardiology (Cardiology) Gavin Pound, MD as Consulting Physician (Rheumatology) Truitt Merle, MD as Consulting Physician (Hematology) Juanita Craver, MD as Consulting Physician (Gastroenterology) Garner Nash, DO as Consulting Physician (Pulmonary Disease)  Date of Service:  09/04/2022  CHIEF COMPLAINT: f/u of CMML  CURRENT THERAPY:  Azacitadine, days 1-5 q28d, starting 02/06/22  ASSESSMENT & PLAN:  [name] is a 76 y.o. female with   1. 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 SPEP and UPEP with immunofixation were negative. No lab evidence of hemolysis, erythropoietin level is normal.  -Her prior bone marrow biopsy in 2016 was negative except several small lymphoid aggregation, suspicious for low-grade B cell lymphoproliferative process, especially SLL. Her peripheral white count has been normal, no elevated lymphocytes -CT scan from 03/2015 was negative for adenopathy or splenomegaly. no B symptoms  --She developed worsening back pain, pelvic MRI 07/04/21 showed a mass in the presacral space. 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 Her BM biopsy was reviewed at [facility] and was felt to be CMML-1 with 5% blasts.  -She began azacitadine on 02/06/22.  -She was hospitalized 03/29/22 - 04/13/22 for A.fib with RVR. Repeat bone marrow biopsy during hospitalization showed persistent CMML with only 2% blasts, slightly lower than before. Cytology from thoracentesis was negative. 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 -she fell last week and has been feeling weaker in general. I recommend holding treatment this week. Plan to restart in 2 weeks and move to every 6 weeks after.  -labs reviewed, Hgb 8.3, platelet 68K, overall stable. Will monitor to see if she needs blood transfusion.  2. Symptom Management: Weakness, Low appetite -secondary to treatment -we will delay her azacitadine by 2 weeks, then plan to continue every 6 weeks. -I will call in mirtazapine for appetite. This may also help some of her sleep issues.   3.  A-fib with RVR; CHF -hospitalized three times in 2023 for CHF exacerbation -s/p thoracentesis for PE 03/30/22 - Heart rate controlled with high dose metoprolol, digoxin and diltiazem   4. COPD, AF, HTN -on resp meds and eliquis; continue per PCP, cardiology, and pulmonology  -Due to thrombocytopenia, Eliquis has been on hold -Continue PCP, cardiology, and pulmonology follow-up   5. Rheumatoid arthritis -on Remicade q8 weeks per rheumatologist Dr. Trudie Reed.  -She takes vitamin supplements and gets prolia injection q31month with her PCP.      PLAN: -hold azacitadine today due to recent fall and fatigue -I called in mirtazapine -lab, flush, f/u, and azacitadine in 2 and 8 weeks   No problem-specific Assessment & Plan notes found for this encounter.   SUMMARY OF ONCOLOGIC HISTORY: Oncology History  CMML (chronic myelomonocytic leukemia) [place]  11/09/2021 Initial Biopsy   DIAGNOSIS:   -  Monoclonal B-cell population with co-expression of CD5 comprises 17%  of all lymphocytes  -  See comment   COMMENT:  In addition to the clonal B-cell population, there is a myeloblast  population (CD34, CD38, HLA-DR, CD117, CD123 and CD33) that comprises 2% of  the total cellular events.  Please see concurrent tissue biopsy (below) for additional work-up and final diagnosis.    FINAL MICROSCOPIC DIAGNOSIS:   A. SOFT TISSUE  MASS, PRE SACRAL, NEEDLE CORE BIOPSY:  -  Chronic lymphocytic leukemia/small lymphocytic lymphoma  -  Extra medullary hematopoiesis  -  See comment   COMMENT:  The biopsy consists of multiple soft tissue cores with lymphoid nodules and a dense hematopoietic infiltrate consistent with extra medullary hematopoiesis.  MPO and E-cadherin highlight myeloid and erythroid precursors respectively.  CD34 highlights increased vasculature and is also positive within the cytoplasm of megakaryocytes.  A few small, immature mononuclear cells appear to be positive for CD34 and CD117. TdT shows rare, scattered positive cells.  CD20 highlights aggregates of B cells which are admixed with CD3 positive T cells.  T cells are an admixture of CD4 and CD8.  The B cells are also positive for CD5, CD23 and Bcl-2.  The B cells do not show significant staining for CD10, BCL6 or cyclin D1.  CD138 highlights scattered plasma cells which are polytypic by kappa and lambda in situ hybridization.  Flow cytometry performed on the sample (see WL [id number]) identified a kappa restricted CD5 positive B-cell population comprising 70% of lymphocytes.  In addition, a small myeloblast population comprised 2% of the total cellular events.   Overall, the findings are consistent with soft tissue involvement by  chronic lymphocytic leukemia/small lymphocytic lymphoma and extra medullary hematopoiesis. In reviewing the patient's CBC data (macrocytic anemia and thrombocytopenia), I would recommend a bone marrow biopsy to assess for marrow involvement by CLL/SLL.    12/23/2021 Imaging   EXAM: CT CHEST, ABDOMEN, AND PELVIS WITH CONTRAST  IMPRESSION: 1. Slight interval enlargement of a presacral soft tissue mass measuring 7.2 x 4.7 cm, previously 6.9 x 4.1 cm on prior MR of the pelvis dated 07/04/2021. By report, this represents a biopsy proven lymphoma. 2. Pleural nodule of the dependent right lower lobe overlying the posterior right tenth rib  and pleural or paraspinous soft tissue mass overlying the right aspect of the T10 vertebral body, very slightly enlarged compared to prior examination of the chest dated 06/25/2020, consistent with additional sites of lymphomatous involvement given very indolent growth. These could be better assessed for metabolic activity by FDG PET/CT if desired. 3. There is mild, bibasilar predominant pulmonary fibrosis in a pattern featuring irregular peripheral interstitial opacity, septal thickening, but without clear evidence of subpleural bronchiolectasis or honeycombing, with a somewhat asymmetric distribution most conspicuously involving the right lower lobe and lingula. These findings are significantly worsened when compared to prior examination dated 06/25/2020, particularly in the right lower lobe. Given interval change, this may reflect sequelae of interval infection or aspiration, however appearance is generally suspicious for fibrotic interstitial lung disease, and if characterized by ATS pulmonary fibrosis is in an "indeterminate for UIP" pattern, differential considerations including both UIP and NSIP. 4. Emphysema.   Aortic Atherosclerosis (ICD10-I70.0) and Emphysema (ICD10-J43.9).   01/05/2022 Pathology Results   DIAGNOSIS:   BONE MARROW, ASPIRATE, CLOT, CORE:  -Hypercellular bone marrow for age with features of  myelodysplastic/myeloproliferative neoplasm  -Minor abnormal B-cell population  -See comment   PERIPHERAL BLOOD:  -Macrocytic anemia  -Neutrophilic left shift and monocytosis  -Thrombocytopenia   COMMENT:  The bone marrow is hypercellular for age with dyspoietic changes  involving myeloid cell lines associated with monocytosis and increased number of blastic cells (12%) as primarily seen by morphology, many of which display monocytic features.  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  In this background, there are several predominantly small lymphoid aggregates mostly composed of small lymphoid cells.  By flow cytometry, a minor abnormal B-cell population expressing CD5 is seen and representing 2% of all cells.  This correlate with previously known B-cell lymphoproliferative process.  Correlation with cytogenetic and FISH studies is strongly recommended.    DIAGNOSIS:   -Increased number of monocytic cells present (25%)  -Minor abnormal B-cell population identified.  -See comment   COMMENT:  Flow cytometric analysis shows increased number of monocytic cells representing 25% of all cells but without aberrant phenotype or CD34 expression.  A significant CD34-positive blastic population is not identified.  The lymphoid population shows a minor B-cell population representing 2% of all cells and expressing B-cell antigens including CD20 associated with CD5, CD200 and possibly dim kappa expression.  The latter findings are abnormal and correlate with previously known B-cell lymphoproliferative process.  No significant T-cell phenotypic abnormalities identified.    01/12/2022 Initial Diagnosis   CMML (chronic myelomonocytic leukemia) ([place])   01/12/2022 Cancer Staging   Staging form: Chronic Myeloid Leukemia, AJCC 8th Edition - Clinical stage from 01/12/2022: Bone marrow blast count (%): 12, Additional clonal changes: Unknown - Signed by Truitt Merle, MD on 01/12/2022 Stage prefix: Initial diagnosis   02/06/2022 - 08/11/2022 Chemotherapy   Patient is on Treatment Plan : MYELODYSPLASIA  Azacitidine IV D1-7 q28d     02/06/2022 -  Chemotherapy   Patient is on Treatment Plan : MYELODYSPLASIA  Azacitidine IV D1-7 q28d        INTERVAL HISTORY:  [name] is here for a follow up of CMML. She was last seen by me on 08/07/22. She presents to the clinic accompanied by her husband. She  tells me she fell on concrete last week. She notes she was using a cane, plan to use a walker from now on. She denies pain to the bruise on her face. Her husband expressed concern about her weakness.   All other systems were reviewed with the patient and are negative.  MEDICAL HISTORY:  Past Medical History:  Diagnosis Date   Arthritis    Rheumatoid arthritis   Celiac disease    Chronic kidney disease    stage 3 from MD notes   COPD (chronic obstructive pulmonary disease) ([place])    Dyspnea    with going up stairs   Family history of adverse reaction to anesthesia    father had hard time waking up   Headache    sinus headaches   Hot flashes    Hypertension    Iron deficiency anemia    Pneumonia    per patient "I have walking pneumonia"    SURGICAL HISTORY: Past Surgical History:  Procedure Laterality Date   COLONOSCOPY     ECTOPIC PREGNANCY SURGERY      x 2   IR IMAGING GUIDED PORT INSERTION  01/31/2022   REVERSE SHOULDER ARTHROPLASTY Right 02/02/2017   Procedure: RIGHT REVERSE SHOULDER ARTHROPLASTY;  Surgeon: Netta Cedars, MD;  Location: [facility];  Service: Orthopedics;  Laterality: Right;    I have reviewed the social history and family history with the patient and they are unchanged from previous note.  ALLERGIES:  is allergic to digoxin and related, gluten meal, penicillins, alendronate, and hydroxychloroquine.  MEDICATIONS:  Current Outpatient Medications  Medication Sig Dispense Refill   mirtazapine (REMERON)  7.5 MG tablet Take 1 tablet (7.5 mg total) by mouth at bedtime. 30 tablet 0   denosumab (PROLIA) 60 MG/ML SOSY injection Inject 60 mg into the skin every 6 (six) months.     diltiazem (CARDIZEM CD) 240 MG 24 hr capsule Take 1 capsule (240 mg total) by mouth daily. 30 capsule 6   furosemide (LASIX) 20 MG tablet Take 1 tablet (20 mg total) by mouth daily. 30 tablet 11   gabapentin (NEURONTIN) 300 MG capsule Take 300 mg by mouth in the morning.     InFLIXimab  (REMICADE IV) Inject 10 mg into the vein every 2 (two) months.     losartan (COZAAR) 50 MG tablet Take 50 mg by mouth daily.     Metoprolol Tartrate 75 MG TABS Take 150 mg by mouth 2 (two) times daily. 360 tablet 3   potassium chloride SA (KLOR-CON M20) 20 MEQ tablet Take 1 tablet (20 mEq total) by mouth daily. Take with furosemide 90 tablet 3   predniSONE (DELTASONE) 5 MG tablet Take 5 mg by mouth daily with breakfast.     Tiotropium Bromide-Olodaterol (STIOLTO RESPIMAT) 2.5-2.5 MCG/ACT AERS Inhale 2 puffs into the lungs daily. 4 g 5   No current facility-administered medications for this visit.   Facility-Administered Medications Ordered in Other Visits  Medication Dose Route Frequency Provider Last Rate Last Admin   acetaminophen (TYLENOL) 325 MG tablet            diphenhydrAMINE (BENADRYL) 25 mg capsule             PHYSICAL EXAMINATION: ECOG PERFORMANCE STATUS: 3 - Symptomatic, >50% confined to bed  Vitals:   09/04/22 1237  BP: 100/71  Pulse: 98  Resp: 15  Temp: 99 F (37.2 C)  SpO2: 91%   Wt Readings from Last 3 Encounters:  09/04/22 116 lb (52.6 kg)  08/29/22 114 lb 12.8 oz (52.1 kg)  08/07/22 116 lb 11.2 oz (52.9 kg)     GENERAL:alert, no distress and comfortable SKIN: skin color normal, no rashes or significant lesions EYES: normal, Conjunctiva are pink and non-injected, sclera clear  NEURO: alert & oriented x 3 with fluent speech  LABORATORY DATA:  I have reviewed the data as listed    Latest Ref Rng & Units 09/04/2022   11:50 AM 08/07/2022   11:02 AM 08/04/2022   11:00 AM  CBC  WBC 4.0 - 10.5 K/uL 5.4  5.7  6.0   Hemoglobin 12.0 - 15.0 g/dL 8.3  9.6  9.6   Hematocrit 36.0 - 46.0 % 26.3  29.1  29.1   Platelets 150 - 400 K/uL 68  51  32         Latest Ref Rng & Units 09/04/2022   11:50 AM 08/07/2022   11:02 AM 07/31/2022   11:37 AM  CMP  Glucose 70 - 99 mg/dL 149  112  149   BUN 8 - 23 mg/dL _0 Creatinine 0.44 - 1.00 mg/dL 1.03  0.89  0.84    Sodium 135 - 145 mmol/L 135  137  136   Potassium 3.5 - 5.1 mmol/L 4.0  3.7  4.2   Chloride 98 - 111 mmol/L 102  99  101   CO2 22 - 32 mmol/L 28  32  30   Calcium 8.9 - 10.3 mg/dL 8.7  9.6  8.4   Total Protein 6.5 - 8.1 g/dL 5.5  6.0  5.7   Total Bilirubin 0.3 -  1.2 mg/dL 0.7  0.9  0.9   Alkaline Phos 38 - 126 U/L 46  44  45   AST 15 - 41 U/L _0 ALT 0 - 44 U/L _1 RADIOGRAPHIC STUDIES: I have personally reviewed the radiological images as listed and agreed with the findings in the report. No results found.    No orders of the defined types were placed in this encounter.  All questions were answered. The patient knows to call the clinic with any problems, questions or concerns. No barriers to learning was detected. The total time spent in the appointment was 30 minutes.     Truitt Merle, MD 09/04/2022   I, Wilburn Mylar, am acting as scribe for Truitt Merle, MD.   I have reviewed the above documentation for accuracy and completeness, and I agree with the above.

## 2022-09-05 ENCOUNTER — Inpatient Hospital Stay: Payer: Medicare Other

## 2022-09-05 DIAGNOSIS — N1831 Chronic kidney disease, stage 3a: Secondary | ICD-10-CM | POA: Diagnosis not present

## 2022-09-05 DIAGNOSIS — I5022 Chronic systolic (congestive) heart failure: Secondary | ICD-10-CM | POA: Diagnosis not present

## 2022-09-05 DIAGNOSIS — C9590 Leukemia, unspecified not having achieved remission: Secondary | ICD-10-CM | POA: Diagnosis not present

## 2022-09-05 DIAGNOSIS — I130 Hypertensive heart and chronic kidney disease with heart failure and stage 1 through stage 4 chronic kidney disease, or unspecified chronic kidney disease: Secondary | ICD-10-CM | POA: Diagnosis not present

## 2022-09-05 DIAGNOSIS — D631 Anemia in chronic kidney disease: Secondary | ICD-10-CM | POA: Diagnosis not present

## 2022-09-05 DIAGNOSIS — J449 Chronic obstructive pulmonary disease, unspecified: Secondary | ICD-10-CM | POA: Diagnosis not present

## 2022-09-06 ENCOUNTER — Inpatient Hospital Stay: Payer: Medicare Other

## 2022-09-07 ENCOUNTER — Inpatient Hospital Stay: Payer: Medicare Other

## 2022-09-07 DIAGNOSIS — C9590 Leukemia, unspecified not having achieved remission: Secondary | ICD-10-CM | POA: Diagnosis not present

## 2022-09-07 DIAGNOSIS — N1831 Chronic kidney disease, stage 3a: Secondary | ICD-10-CM | POA: Diagnosis not present

## 2022-09-07 DIAGNOSIS — J449 Chronic obstructive pulmonary disease, unspecified: Secondary | ICD-10-CM | POA: Diagnosis not present

## 2022-09-07 DIAGNOSIS — D631 Anemia in chronic kidney disease: Secondary | ICD-10-CM | POA: Diagnosis not present

## 2022-09-07 DIAGNOSIS — I5022 Chronic systolic (congestive) heart failure: Secondary | ICD-10-CM | POA: Diagnosis not present

## 2022-09-07 DIAGNOSIS — I130 Hypertensive heart and chronic kidney disease with heart failure and stage 1 through stage 4 chronic kidney disease, or unspecified chronic kidney disease: Secondary | ICD-10-CM | POA: Diagnosis not present

## 2022-09-08 ENCOUNTER — Telehealth: Payer: Self-pay | Admitting: Hematology

## 2022-09-08 ENCOUNTER — Ambulatory Visit
Admission: RE | Admit: 2022-09-08 | Discharge: 2022-09-08 | Disposition: A | Discharge status: Home / Self Care | Payer: Medicare Other | Source: Ambulatory Visit | Attending: Family Medicine | Admitting: Family Medicine

## 2022-09-08 ENCOUNTER — Other Ambulatory Visit: Payer: Self-pay | Admitting: Family Medicine

## 2022-09-08 ENCOUNTER — Inpatient Hospital Stay: Payer: Medicare Other

## 2022-09-08 ENCOUNTER — Telehealth: Payer: Self-pay | Admitting: Internal Medicine

## 2022-09-08 DIAGNOSIS — R0600 Dyspnea, unspecified: Secondary | ICD-10-CM

## 2022-09-08 DIAGNOSIS — J90 Pleural effusion, not elsewhere classified: Secondary | ICD-10-CM | POA: Diagnosis not present

## 2022-09-08 DIAGNOSIS — Z20822 Contact with and (suspected) exposure to covid-19: Secondary | ICD-10-CM | POA: Diagnosis not present

## 2022-09-08 DIAGNOSIS — T148XXA Other injury of unspecified body region, initial encounter: Secondary | ICD-10-CM | POA: Diagnosis not present

## 2022-09-08 DIAGNOSIS — J449 Chronic obstructive pulmonary disease, unspecified: Secondary | ICD-10-CM | POA: Diagnosis not present

## 2022-09-08 DIAGNOSIS — R0602 Shortness of breath: Secondary | ICD-10-CM | POA: Diagnosis not present

## 2022-09-08 NOTE — Telephone Encounter (Signed)
error 

## 2022-09-08 NOTE — Telephone Encounter (Signed)
Left message with follow-up appointments per 9/11 los.

## 2022-09-11 ENCOUNTER — Encounter: Payer: Self-pay | Admitting: Hematology

## 2022-09-11 DIAGNOSIS — Z1231 Encounter for screening mammogram for malignant neoplasm of breast: Secondary | ICD-10-CM | POA: Diagnosis not present

## 2022-09-14 DIAGNOSIS — N1831 Chronic kidney disease, stage 3a: Secondary | ICD-10-CM | POA: Diagnosis not present

## 2022-09-14 DIAGNOSIS — D631 Anemia in chronic kidney disease: Secondary | ICD-10-CM | POA: Diagnosis not present

## 2022-09-14 DIAGNOSIS — J449 Chronic obstructive pulmonary disease, unspecified: Secondary | ICD-10-CM | POA: Diagnosis not present

## 2022-09-14 DIAGNOSIS — I130 Hypertensive heart and chronic kidney disease with heart failure and stage 1 through stage 4 chronic kidney disease, or unspecified chronic kidney disease: Secondary | ICD-10-CM | POA: Diagnosis not present

## 2022-09-14 DIAGNOSIS — C9590 Leukemia, unspecified not having achieved remission: Secondary | ICD-10-CM | POA: Diagnosis not present

## 2022-09-14 DIAGNOSIS — I5022 Chronic systolic (congestive) heart failure: Secondary | ICD-10-CM | POA: Diagnosis not present

## 2022-09-15 DIAGNOSIS — L97509 Non-pressure chronic ulcer of other part of unspecified foot with unspecified severity: Secondary | ICD-10-CM | POA: Diagnosis not present

## 2022-09-15 DIAGNOSIS — L97519 Non-pressure chronic ulcer of other part of right foot with unspecified severity: Secondary | ICD-10-CM | POA: Diagnosis not present

## 2022-09-15 DIAGNOSIS — C931 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: Secondary | ICD-10-CM | POA: Diagnosis not present

## 2022-09-15 DIAGNOSIS — Z23 Encounter for immunization: Secondary | ICD-10-CM | POA: Diagnosis not present

## 2022-09-15 DIAGNOSIS — M069 Rheumatoid arthritis, unspecified: Secondary | ICD-10-CM | POA: Diagnosis not present

## 2022-09-18 ENCOUNTER — Inpatient Hospital Stay (HOSPITAL_BASED_OUTPATIENT_CLINIC_OR_DEPARTMENT_OTHER): Payer: Medicare Other | Admitting: Adult Health

## 2022-09-18 ENCOUNTER — Inpatient Hospital Stay: Payer: Medicare Other

## 2022-09-18 ENCOUNTER — Other Ambulatory Visit: Payer: Self-pay

## 2022-09-18 VITALS — BP 110/65 | HR 88 | Temp 97.7°F | Resp 18 | Ht 63.0 in | Wt 111.4 lb

## 2022-09-18 DIAGNOSIS — C931 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: Secondary | ICD-10-CM

## 2022-09-18 DIAGNOSIS — I110 Hypertensive heart disease with heart failure: Secondary | ICD-10-CM | POA: Diagnosis not present

## 2022-09-18 DIAGNOSIS — I4891 Unspecified atrial fibrillation: Secondary | ICD-10-CM | POA: Diagnosis not present

## 2022-09-18 DIAGNOSIS — I509 Heart failure, unspecified: Secondary | ICD-10-CM | POA: Diagnosis not present

## 2022-09-18 DIAGNOSIS — J449 Chronic obstructive pulmonary disease, unspecified: Secondary | ICD-10-CM | POA: Diagnosis not present

## 2022-09-18 DIAGNOSIS — Z5111 Encounter for antineoplastic chemotherapy: Secondary | ICD-10-CM | POA: Diagnosis not present

## 2022-09-18 DIAGNOSIS — Z95828 Presence of other vascular implants and grafts: Secondary | ICD-10-CM

## 2022-09-18 LAB — CBC WITH DIFFERENTIAL (CANCER CENTER ONLY)
Abs Immature Granulocytes: 0.12 10*3/uL — ABNORMAL HIGH (ref 0.00–0.07)
Basophils Absolute: 0 10*3/uL (ref 0.0–0.1)
Basophils Relative: 0 %
Eosinophils Absolute: 0 10*3/uL (ref 0.0–0.5)
Eosinophils Relative: 0 %
HCT: 31.5 % — ABNORMAL LOW (ref 36.0–46.0)
Hemoglobin: 10 g/dL — ABNORMAL LOW (ref 12.0–15.0)
Immature Granulocytes: 1 %
Lymphocytes Relative: 10 %
Lymphs Abs: 1 10*3/uL (ref 0.7–4.0)
MCH: 34.6 pg — ABNORMAL HIGH (ref 26.0–34.0)
MCHC: 31.7 g/dL (ref 30.0–36.0)
MCV: 109 fL — ABNORMAL HIGH (ref 80.0–100.0)
Monocytes Absolute: 2.4 10*3/uL — ABNORMAL HIGH (ref 0.1–1.0)
Monocytes Relative: 26 %
Neutro Abs: 5.8 10*3/uL (ref 1.7–7.7)
Neutrophils Relative %: 63 %
Platelet Count: 172 10*3/uL (ref 150–400)
RBC: 2.89 MIL/uL — ABNORMAL LOW (ref 3.87–5.11)
RDW: 17.4 % — ABNORMAL HIGH (ref 11.5–15.5)
WBC Count: 9.2 10*3/uL (ref 4.0–10.5)
nRBC: 0.2 % (ref 0.0–0.2)

## 2022-09-18 LAB — BASIC METABOLIC PANEL - CANCER CENTER ONLY
Anion gap: 7 (ref 5–15)
BUN: 32 mg/dL — ABNORMAL HIGH (ref 8–23)
CO2: 27 mmol/L (ref 22–32)
Calcium: 8.8 mg/dL — ABNORMAL LOW (ref 8.9–10.3)
Chloride: 101 mmol/L (ref 98–111)
Creatinine: 1.15 mg/dL — ABNORMAL HIGH (ref 0.44–1.00)
GFR, Estimated: 50 mL/min — ABNORMAL LOW (ref >60)
Glucose, Bld: 189 mg/dL — ABNORMAL HIGH (ref 70–99)
Potassium: 4.3 mmol/L (ref 3.5–5.1)
Sodium: 135 mmol/L (ref 135–145)

## 2022-09-18 MED ORDER — SODIUM CHLORIDE 0.9% FLUSH
10.0000 mL | Freq: Once | INTRAVENOUS | Status: AC
  Administered 2022-09-18: 10 mL

## 2022-09-18 MED ORDER — SODIUM CHLORIDE 0.9 % IV SOLN
75.0000 mg/m2 | Freq: Once | INTRAVENOUS | Status: AC
  Administered 2022-09-18: 115 mg via INTRAVENOUS
  Filled 2022-09-18: qty 11.5

## 2022-09-18 MED ORDER — HEPARIN SOD (PORK) LOCK FLUSH 100 UNIT/ML IV SOLN
500.0000 [IU] | Freq: Once | INTRAVENOUS | Status: AC | PRN
  Administered 2022-09-18: 500 [IU]

## 2022-09-18 MED ORDER — SODIUM CHLORIDE 0.9 % IV SOLN: Freq: Once | INTRAVENOUS | Status: AC

## 2022-09-18 MED ORDER — ONDANSETRON HCL 8 MG PO TABS
8.0000 mg | ORAL_TABLET | Freq: Once | ORAL | Status: AC
  Administered 2022-09-18: 8 mg via ORAL
  Filled 2022-09-18: qty 1

## 2022-09-18 MED ORDER — SODIUM CHLORIDE 0.9% FLUSH
10.0000 mL | INTRAVENOUS | Status: DC | PRN
  Administered 2022-09-18: 10 mL

## 2022-09-18 NOTE — Patient Instructions (Signed)
[facility]  Discharge Instructions: Thank you for choosing [facility] to provide your oncology and hematology care.   If you have a lab appointment with the [facility], please go directly to the [facility] and check in at the registration area.   Wear comfortable clothing and clothing appropriate for easy access to any Portacath or PICC line.   We strive to give you quality time with your provider. You may need to reschedule your appointment if you arrive late (15 or more minutes).  Arriving late affects you and other patients whose appointments are after yours.  Also, if you miss three or more appointments without notifying the office, you may be dismissed from the clinic at the provider's discretion.      For prescription refill requests, have your pharmacy contact our office and allow 72 hours for refills to be completed.    Today you received the following chemotherapy and/or immunotherapy agents vidaza       To help prevent nausea and vomiting after your treatment, we encourage you to take your nausea medication as directed.  BELOW ARE SYMPTOMS THAT SHOULD BE REPORTED IMMEDIATELY: *FEVER GREATER THAN 100.4 F (38 C) OR HIGHER *CHILLS OR SWEATING *NAUSEA AND VOMITING THAT IS NOT CONTROLLED WITH YOUR NAUSEA MEDICATION *UNUSUAL SHORTNESS OF BREATH *UNUSUAL BRUISING OR BLEEDING *URINARY PROBLEMS (pain or burning when urinating, or frequent urination) *BOWEL PROBLEMS (unusual diarrhea, constipation, pain near the anus) TENDERNESS IN MOUTH AND THROAT WITH OR WITHOUT PRESENCE OF ULCERS (sore throat, sores in mouth, or a toothache) UNUSUAL RASH, SWELLING OR PAIN  UNUSUAL VAGINAL DISCHARGE OR ITCHING   Items with * indicate a potential emergency and should be followed up as soon as possible or go to the Emergency Department if any problems should occur.  Please show the CHEMOTHERAPY ALERT CARD or IMMUNOTHERAPY ALERT CARD at check-in to  the Emergency Department and triage nurse.  Should you have questions after your visit or need to cancel or reschedule your appointment, please contact [facility]  Dept: [phone]  and follow the prompts.  Office hours are 8:00 a.m. to 4:30 p.m. Monday - Friday. Please note that voicemails left after 4:00 p.m. may not be returned until the following business day.  We are closed weekends and major holidays. You have access to a nurse at all times for urgent questions. Please call the main number to the clinic Dept: [phone] and follow the prompts.   For any non-urgent questions, you may also contact your provider using MyChart. We now offer e-Visits for anyone 26 and older to request care online for non-urgent symptoms. For details visit [email or web address].   Also download the MyChart app! Go to the app store, search "MyChart", open the app, select [facility], and log in with your MyChart username and password.  Masks are optional in the cancer centers. If you would like for your care team to wear a mask while they are taking care of you, please let them know. You may have one support Filmore Molyneux who is at least 76 years old accompany you for your appointments.

## 2022-09-18 NOTE — Progress Notes (Signed)
[facility] Follow up:    Sherry Pillar, MD 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  Please see concurrent tissue biopsy (below) for additional work-up and final diagnosis.    FINAL MICROSCOPIC DIAGNOSIS:   A. SOFT TISSUE MASS, PRE SACRAL, NEEDLE CORE BIOPSY:  -  Chronic lymphocytic leukemia/small lymphocytic lymphoma  -  Extra medullary hematopoiesis  -  See comment   COMMENT:  The biopsy consists of multiple soft tissue cores with lymphoid nodules and a dense hematopoietic infiltrate consistent with extra medullary hematopoiesis.  MPO and E-cadherin highlight myeloid and erythroid precursors respectively.  CD34 highlights increased vasculature and is also positive within the cytoplasm of megakaryocytes.  A few small, immature mononuclear cells appear to be positive for CD34 and CD117. TdT shows rare, scattered positive cells.  CD20 highlights aggregates of B cells which are admixed with CD3 positive T cells.  T cells are an admixture of CD4 and CD8.  The B cells are also positive for CD5, CD23 and Bcl-2.  The B cells do not show significant staining for CD10, BCL6 or cyclin D1.  CD138 highlights scattered plasma cells which are  polytypic by kappa and lambda in situ hybridization.  Flow cytometry performed on the sample (see WL [id number]) identified a kappa restricted CD5 positive B-cell population comprising 70% of lymphocytes.  In addition, a small myeloblast population comprised 2% of the total cellular events.   Overall, the findings are consistent with soft tissue involvement by  chronic lymphocytic leukemia/small lymphocytic lymphoma and extra medullary hematopoiesis. In reviewing the patient's CBC data (macrocytic anemia and thrombocytopenia), I would recommend a bone marrow biopsy to assess for marrow involvement by CLL/SLL.    12/23/2021 Imaging   EXAM: CT CHEST, ABDOMEN, AND PELVIS WITH CONTRAST  IMPRESSION: 1. Slight interval enlargement of a presacral soft tissue mass measuring 7.2 x 4.7 cm, previously 6.9 x 4.1 cm on prior MR of the pelvis dated 07/04/2021. By report, this represents a biopsy proven lymphoma. 2. Pleural nodule of the dependent right lower lobe overlying the posterior right tenth rib and pleural or paraspinous soft tissue mass overlying the right aspect of the T10 vertebral body, very slightly enlarged compared to prior examination of the chest dated 06/25/2020, consistent with additional sites of lymphomatous involvement given very indolent growth. These could be better assessed for metabolic activity by FDG PET/CT if desired. 3. There is mild, bibasilar predominant pulmonary fibrosis in a pattern featuring irregular peripheral interstitial opacity, septal thickening, but without clear evidence of subpleural bronchiolectasis or honeycombing, with a somewhat asymmetric distribution most conspicuously involving the right lower lobe and lingula. These findings are significantly worsened when compared to prior examination dated 06/25/2020, particularly in the right lower lobe. Given interval change, this may reflect sequelae of  interval infection or aspiration, however appearance is  generally suspicious for fibrotic interstitial lung disease, and if characterized by ATS pulmonary fibrosis is in an "indeterminate for UIP" pattern, differential considerations including both UIP and NSIP. 4. Emphysema.   Aortic Atherosclerosis (ICD10-I70.0) and Emphysema (ICD10-J43.9).   01/05/2022 Pathology Results   DIAGNOSIS:   BONE MARROW, ASPIRATE, CLOT, CORE:  -Hypercellular bone marrow for age with features of  myelodysplastic/myeloproliferative neoplasm  -Minor abnormal B-cell population  -See comment   PERIPHERAL BLOOD:  -Macrocytic anemia  -Neutrophilic left shift and monocytosis  -Thrombocytopenia   COMMENT:  The bone marrow is hypercellular for age with dyspoietic changes  involving myeloid cell lines associated with monocytosis and increased number of blastic cells (12%) as primarily seen by morphology, many of which display monocytic features.  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  In this background, there are several predominantly small lymphoid aggregates mostly composed of small lymphoid cells.  By flow cytometry, a minor abnormal B-cell population expressing CD5 is seen and representing 2% of all cells.  This correlate with previously known B-cell lymphoproliferative process.  Correlation with cytogenetic and FISH studies is strongly recommended.    DIAGNOSIS:   -Increased number of monocytic cells present (25%)  -Minor abnormal B-cell population identified.  -See comment   COMMENT:  Flow cytometric analysis shows increased number of monocytic cells representing 25% of all cells but without aberrant phenotype or CD34 expression.  A significant CD34-positive blastic population is not identified.  The lymphoid population shows a minor B-cell population representing 2% of all cells and  expressing B-cell antigens including CD20 associated with CD5, CD200 and possibly dim kappa expression.  The latter findings are abnormal and correlate with previously known B-cell lymphoproliferative process.  No significant T-cell phenotypic abnormalities identified.    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 female returns for    Patient Active Problem List   Diagnosis Date Noted  . Failure to thrive in adult 06/29/2022  . Acute urinary retention 06/20/2022  . Arterial vascular disease 06/20/2022  . Atrial fibrillation with RVR ([place]) 06/19/2022  . Acute respiratory failure with hypoxia ([place]) 05/19/2022  . Rheumatoid arthritis ([place]) 05/19/2022  . Closed fracture of metatarsal bone of left foot 04/21/2022  . Hematoma of left foot 04/08/2022  . Malnutrition of moderate degree 04/06/2022  . Chronic systolic CHF (congestive heart failure) ([place]) 04/05/2022  . Dyspnea on exertion 04/05/2022  . Hypokalemia 04/05/2022  . Physical deconditioning 04/05/2022  . Chronic kidney disease, stage 3a ([place]) 04/05/2022  . Constipation 04/05/2022  . Atrial fibrillation with slow ventricular response ([place]) 03/29/2022  . Multifocal pneumonia 03/29/2022  . Pancytopenia ([place]) 03/29/2022  . Bilateral pleural effusion 03/29/2022  . Port-A-Cath in place 02/06/2022  . 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 Stage  2 moderate COPD by GOLD classification ([employer]) 05/03/2021  . Hx of multiple pulmonary nodules 05/03/2021  . Dermatochalasis 08/12/2019  . S/P shoulder replacement, 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  . Anemia of chronic disease 07/06/2015  . Thrombocytopenia ([place]) 02/04/2015    is allergic to digoxin and related, gluten meal, penicillins, alendronate, and hydroxychloroquine.  MEDICAL HISTORY: Past Medical History:  Diagnosis Date  . Arthritis    Rheumatoid arthritis  . Celiac disease   . Chronic kidney disease    stage 3 from MD notes  . COPD (chronic obstructive pulmonary disease) ([place])   . Dyspnea    with going up stairs  . Family history of adverse reaction to anesthesia    father had hard time waking up  . Headache    sinus headaches  . Hot flashes   . Hypertension   . Iron deficiency anemia   . Pneumonia    per patient "I have walking pneumonia"    SURGICAL HISTORY: Past Surgical History:  Procedure Laterality Date  . COLONOSCOPY    . ECTOPIC PREGNANCY SURGERY      x 2  . IR IMAGING GUIDED PORT INSERTION  01/31/2022  . REVERSE SHOULDER ARTHROPLASTY Right 02/02/2017   Procedure: RIGHT REVERSE SHOULDER ARTHROPLASTY;  Surgeon: Netta Cedars, MD;  Location: [facility];  Service: Orthopedics;  Laterality: Right;    SOCIAL HISTORY: Social History   Socioeconomic History  . Marital status: Married    Spouse name: Not on file  . Number of children: Not on file  . Years of education: Not on file  . Highest education level: Not on file  Occupational History  . Not on file  Tobacco Use  . Smoking status: Former    Packs/day: 1.00    Years: 30.00    Total pack years: 30.00    Types: Cigarettes    Quit date: 2005    Years since quitting: 18.7  . Smokeless tobacco: Never  Vaping Use  . Vaping Use: Never used  Substance and Sexual Activity  . Alcohol use: Yes    Alcohol/week: 12.0 - 14.0 standard drinks of alcohol    Types: 12 - 14 Glasses of wine per week  . Drug use: No  . Sexual  activity: Not Currently  Other Topics Concern  . Not on file  Social History Narrative  . Not on file   Social Determinants of Health   Financial Resource Strain: Not on file  Food Insecurity: Not on file  Transportation Needs: Not on file  Physical Activity: Not on file  Stress: Not on file  Social Connections: Not on file  Intimate Partner Violence: Not on file    FAMILY HISTORY: Family History  Problem Relation Age of Onset  . Peripheral Artery Disease Father   . Diabetes Father     Review of Systems - Oncology    PHYSICAL EXAMINATION  ECOG PERFORMANCE STATUS: {CHL ONC ECOG BJ:[id number]}  There were no vitals filed for this visit.  Physical Exam  LABORATORY DATA:  CBC    Component Value Date/Time   WBC 5.4 09/04/2022 1150   RBC 2.35 (L) 09/04/2022 1150   HGB 8.3 (L) 09/04/2022 1150   HGB 10.3 (L) 12/08/2021 1029   HGB 10.8 (L) 01/03/2017 1246   HCT 26.3 (L) 09/04/2022 1150   HCT 32.8 (L) 01/03/2017 1246   PLT 68 (L) 09/04/2022 1150   PLT 80 (L) 12/08/2021 1029   PLT 163 01/03/2017 1246   MCV 111.9 (H) 09/04/2022 1150  MCV 102.8 (H) 01/03/2017 1246   MCH 35.3 (H) 09/04/2022 1150   MCHC 31.6 09/04/2022 1150   RDW 18.6 (H) 09/04/2022 1150   RDW 14.3 01/03/2017 1246   LYMPHSABS 0.8 09/04/2022 1150   LYMPHSABS 2.9 01/03/2017 1246   MONOABS 1.5 (H) 09/04/2022 1150   MONOABS 1.1 (H) 01/03/2017 1246   EOSABS 0.0 09/04/2022 1150   EOSABS 0.1 01/03/2017 1246   BASOSABS 0.1 09/04/2022 1150   BASOSABS 0.0 01/03/2017 1246    CMP     Component Value Date/Time   NA 135 09/04/2022 1150   NA 141 01/04/2016 1001   K 4.0 09/04/2022 1150   K 4.5 01/04/2016 1001   CL 102 09/04/2022 1150   CO2 28 09/04/2022 1150   CO2 24 01/04/2016 1001   GLUCOSE 149 (H) 09/04/2022 1150   GLUCOSE 105 01/04/2016 1001   BUN 24 (H) 09/04/2022 1150   BUN 21.7 01/04/2016 1001   CREATININE 1.03 (H) 09/04/2022 1150   CREATININE 1.2 (H) 01/04/2016 1001   CALCIUM 8.7 (L)  09/04/2022 1150   CALCIUM 9.7 01/04/2016 1001   PROT 5.5 (L) 09/04/2022 1150   PROT 7.0 01/04/2016 1001   ALBUMIN 3.6 09/04/2022 1150   ALBUMIN 4.0 01/04/2016 1001   AST 15 09/04/2022 1150   AST 26 01/04/2016 1001   ALT 11 09/04/2022 1150   ALT 24 01/04/2016 1001   ALKPHOS 46 09/04/2022 1150   ALKPHOS 61 01/04/2016 1001   BILITOT 0.7 09/04/2022 1150   BILITOT 0.41 01/04/2016 1001   GFRNONAA 57 (L) 09/04/2022 1150   GFRAA 56 (L) 01/09/2020 1339       PENDING LABS:   RADIOGRAPHIC STUDIES:  No results found.   PATHOLOGY:     ASSESSMENT and THERAPY PLAN:   No problem-specific Assessment & Plan notes found for this encounter.   No orders of the defined types were placed in this encounter.   All questions were answered. The patient knows to call the clinic with any problems, questions or concerns. We can certainly see the patient much sooner if necessary. This note was electronically signed. Scot Dock, NP 09/18/2022

## 2022-09-19 ENCOUNTER — Telehealth: Payer: Self-pay | Admitting: Adult Health

## 2022-09-19 ENCOUNTER — Other Ambulatory Visit: Payer: Self-pay

## 2022-09-19 ENCOUNTER — Inpatient Hospital Stay (HOSPITAL_BASED_OUTPATIENT_CLINIC_OR_DEPARTMENT_OTHER): Payer: Medicare Other | Admitting: Physician Assistant

## 2022-09-19 ENCOUNTER — Inpatient Hospital Stay: Payer: Medicare Other

## 2022-09-19 VITALS — BP 116/65 | HR 77 | Temp 97.5°F | Resp 18

## 2022-09-19 DIAGNOSIS — Z5111 Encounter for antineoplastic chemotherapy: Secondary | ICD-10-CM | POA: Diagnosis not present

## 2022-09-19 DIAGNOSIS — Z95828 Presence of other vascular implants and grafts: Secondary | ICD-10-CM

## 2022-09-19 DIAGNOSIS — I509 Heart failure, unspecified: Secondary | ICD-10-CM | POA: Diagnosis not present

## 2022-09-19 DIAGNOSIS — I4891 Unspecified atrial fibrillation: Secondary | ICD-10-CM | POA: Diagnosis not present

## 2022-09-19 DIAGNOSIS — C931 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: Secondary | ICD-10-CM

## 2022-09-19 DIAGNOSIS — J449 Chronic obstructive pulmonary disease, unspecified: Secondary | ICD-10-CM | POA: Diagnosis not present

## 2022-09-19 DIAGNOSIS — R21 Rash and other nonspecific skin eruption: Secondary | ICD-10-CM | POA: Diagnosis not present

## 2022-09-19 DIAGNOSIS — I110 Hypertensive heart disease with heart failure: Secondary | ICD-10-CM | POA: Diagnosis not present

## 2022-09-19 MED ORDER — FAMOTIDINE IN NACL 20-0.9 MG/50ML-% IV SOLN
20.0000 mg | Freq: Once | INTRAVENOUS | Status: AC
  Administered 2022-09-19: 20 mg via INTRAVENOUS
  Filled 2022-09-19: qty 50

## 2022-09-19 MED ORDER — ONDANSETRON HCL 8 MG PO TABS
8.0000 mg | ORAL_TABLET | Freq: Once | ORAL | Status: AC
  Administered 2022-09-19: 8 mg via ORAL
  Filled 2022-09-19: qty 1

## 2022-09-19 MED ORDER — HEPARIN SOD (PORK) LOCK FLUSH 100 UNIT/ML IV SOLN
500.0000 [IU] | Freq: Once | INTRAVENOUS | Status: AC | PRN
  Administered 2022-09-19: 500 [IU]

## 2022-09-19 MED ORDER — SODIUM CHLORIDE 0.9 % IV SOLN: Freq: Once | INTRAVENOUS | Status: AC

## 2022-09-19 MED ORDER — SODIUM CHLORIDE 0.9 % IV SOLN
75.0000 mg/m2 | Freq: Once | INTRAVENOUS | Status: AC
  Administered 2022-09-19: 115 mg via INTRAVENOUS
  Filled 2022-09-19: qty 11.5

## 2022-09-19 MED ORDER — SODIUM CHLORIDE 0.9% FLUSH
10.0000 mL | INTRAVENOUS | Status: DC | PRN
  Administered 2022-09-19: 10 mL

## 2022-09-19 NOTE — Progress Notes (Unsigned)
Symptom Management Consult note [facility]    Patient Care Team: [name], MD as PCP - General (Family Medicine) [name], Wonda Cheng, MD as PCP - Cardiology (Cardiology) Gavin Pound, MD as Consulting Physician (Rheumatology) Truitt Merle, MD as Consulting Physician (Hematology) Juanita Craver, MD as Consulting Physician (Gastroenterology) Garner Nash, DO as Consulting Physician (Pulmonary Disease)    Name of the patient: [name]  [id number]  04/29/1946   Date of visit: 09/19/2022   Chief Complaint/Reason for visit: rash   Current Therapy: Vidaza  Last treatment:  Day 2   Cycle 2 today   ASSESSMENT & PLAN: Patient is a 76 y.o. 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 Burr Medico.  I have viewed most recent oncology note and lab work.    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 -CBC from 09/18/22 with platelet count 172. She denies any easy bleeding or bruising. -Patient given IV Pepcid here and encouraged to take daily Claritin.  She has visits daily for the remainder of the week.   -Will recheck patient tomorrow during infusion and will consider checking CBC if rash is worsening. -Discussed with Dr. Burr Medico who agrees with plan of care. - Strict ED precautions discussed should symptoms worsen.       Heme/Onc History: Oncology History  CMML (chronic myelomonocytic leukemia) ([place])  11/09/2021 Initial Biopsy   DIAGNOSIS:   -  Monoclonal B-cell population with co-expression of CD5 comprises 17%  of all lymphocytes  -  See comment   COMMENT:  In addition to the clonal B-cell population, there is a myeloblast  population (CD34, CD38, HLA-DR, CD117, CD123 and CD33) that comprises 2% of the total cellular events.  Please see concurrent tissue biopsy (below) for additional  work-up and final diagnosis.    FINAL MICROSCOPIC DIAGNOSIS:   A. SOFT TISSUE MASS, PRE SACRAL, NEEDLE CORE BIOPSY:  -  Chronic lymphocytic leukemia/small lymphocytic lymphoma  -  Extra medullary hematopoiesis  -  See comment   COMMENT:  The biopsy consists of multiple soft tissue cores with lymphoid nodules and a dense hematopoietic infiltrate consistent with extra medullary hematopoiesis.  MPO and E-cadherin highlight myeloid and erythroid precursors respectively.  CD34 highlights increased vasculature and is also positive within the cytoplasm of megakaryocytes.  A few small, immature mononuclear cells appear to be positive for CD34 and CD117. TdT shows rare, scattered positive cells.  CD20 highlights aggregates of B cells which are admixed with CD3 positive T cells.  T cells are an admixture of CD4 and CD8.  The B cells are also positive for CD5, CD23 and Bcl-2.  The B cells do not show significant staining for CD10, BCL6 or cyclin D1.  CD138 highlights scattered plasma cells which are polytypic by kappa and lambda in situ hybridization.  Flow cytometry performed on the sample (see WL [id number]) identified a kappa restricted CD5 positive B-cell population comprising 70% of lymphocytes.  In addition, a small myeloblast population comprised 2% of the total cellular events.   Overall, the findings are consistent with soft tissue involvement by  chronic lymphocytic leukemia/small lymphocytic lymphoma and extra medullary hematopoiesis. In reviewing the patient's CBC data (macrocytic anemia and thrombocytopenia), I would recommend a bone marrow biopsy to assess for marrow involvement by CLL/SLL.  12/23/2021 Imaging   EXAM: CT CHEST, ABDOMEN, AND PELVIS WITH CONTRAST  IMPRESSION: 1. Slight interval enlargement of a presacral soft tissue mass measuring 7.2 x 4.7 cm, previously 6.9 x 4.1 cm on prior MR of the pelvis dated 07/04/2021. By report, this represents a biopsy proven lymphoma. 2.  Pleural nodule of the dependent right lower lobe overlying the posterior right tenth rib and pleural or paraspinous soft tissue mass overlying the right aspect of the T10 vertebral body, very slightly enlarged compared to prior examination of the chest dated 06/25/2020, consistent with additional sites of lymphomatous involvement given very indolent growth. These could be better assessed for metabolic activity by FDG PET/CT if desired. 3. There is mild, bibasilar predominant pulmonary fibrosis in a pattern featuring irregular peripheral interstitial opacity, septal thickening, but without clear evidence of subpleural bronchiolectasis or honeycombing, with a somewhat asymmetric distribution most conspicuously involving the right lower lobe and lingula. These findings are significantly worsened when compared to prior examination dated 06/25/2020, particularly in the right lower lobe. Given interval change, this may reflect sequelae of interval infection or aspiration, however appearance is generally suspicious for fibrotic interstitial lung disease, and if characterized by ATS pulmonary fibrosis is in an "indeterminate for UIP" pattern, differential considerations including both UIP and NSIP. 4. Emphysema.   Aortic Atherosclerosis (ICD10-I70.0) and Emphysema (ICD10-J43.9).   01/05/2022 Pathology Results   DIAGNOSIS:   BONE MARROW, ASPIRATE, CLOT, CORE:  -Hypercellular bone marrow for age with features of  myelodysplastic/myeloproliferative neoplasm  -Minor abnormal B-cell population  -See comment   PERIPHERAL BLOOD:  -Macrocytic anemia  -Neutrophilic left shift and monocytosis  -Thrombocytopenia   COMMENT:  The bone marrow is hypercellular for age with dyspoietic changes  involving myeloid cell lines associated with monocytosis and increased number of blastic cells (12%) as primarily seen by morphology, many of which display monocytic features.  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  In this background, there are several predominantly small lymphoid aggregates mostly composed of small lymphoid cells.  By flow cytometry, a minor abnormal B-cell population expressing CD5 is seen and representing 2% of all cells.  This correlate with previously known B-cell lymphoproliferative process.  Correlation with cytogenetic and FISH studies is strongly recommended.    DIAGNOSIS:   -Increased number of monocytic cells present (25%)  -Minor abnormal B-cell population identified.  -See comment   COMMENT:  Flow cytometric analysis shows increased number of monocytic cells representing 25% of all cells but without aberrant phenotype or CD34 expression.  A significant CD34-positive blastic population is not identified.  The lymphoid population shows a minor B-cell population representing 2% of all cells and expressing B-cell antigens including CD20 associated with CD5, CD200 and possibly dim kappa expression.  The latter findings are abnormal and correlate with previously known B-cell lymphoproliferative process.  No significant T-cell phenotypic abnormalities identified.    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 female with oncologic history  as above seen in the infusion center today with chief complaint of rash x 3 days, Patient reports rash started on her abdomen. She describes the rash as itching red splotches that itch. She took a Claritin this morning per her husband's recommendation, she is unsure if it is helping yet. She denies history of similar rash. She also has redness on her mons pubis she describes as "a diaper rash" and has been applying OTC ointment. She denies any new soaps, medications, lotions, laundry detergent. No one in house has similar rash. She has a bruise on her left cheek from a fall, otherwise denies any abnormal bleeding or unexplained bruises. Denies any URI symptoms, fever, chills, difficulty breathing, nausea.    ROS  All other systems are reviewed and are negative for acute change except as noted in the HPI.    Allergies  Allergen Reactions   Digoxin And Related Rash    Generalized total body rash with immense itching   Gluten Meal Diarrhea   Penicillins Rash and Other (See Comments)      Did it involve swelling of the face/tongue/throat, SOB, or low BP? No Did it involve sudden or severe rash/hives, skin peeling, or any reaction on the inside of your mouth or nose? Yes Did you need to seek medical attention at a hospital or doctor's office? No When did it last happen?    "Many Years Ago"  If all above answers are "NO", may proceed with cephalosporin use.      Alendronate Rash   Hydroxychloroquine Rash     Past Medical History:  Diagnosis Date   Arthritis    Rheumatoid arthritis   Celiac disease    Chronic kidney disease    stage 3 from MD notes   COPD (chronic obstructive pulmonary disease) (HCC)    Dyspnea    with going up stairs   Family history of adverse reaction to anesthesia    father had hard time waking up   Headache    sinus headaches   Hot flashes    Hypertension    Iron deficiency anemia    Pneumonia    per patient "I have walking pneumonia"     Past  Surgical History:  Procedure Laterality Date   COLONOSCOPY     ECTOPIC PREGNANCY SURGERY      x 2   IR IMAGING GUIDED PORT INSERTION  01/31/2022   REVERSE SHOULDER ARTHROPLASTY Right 02/02/2017   Procedure: RIGHT REVERSE SHOULDER ARTHROPLASTY;  Surgeon: Netta Cedars, MD;  Location: [facility];  Service: Orthopedics;  Laterality: Right;    Social History   Socioeconomic History   Marital status: Married    Spouse name: Not on file   Number of children: Not on file   Years of education: Not on file   Highest education level: Not on file  Occupational History   Not on file  Tobacco Use   Smoking status: Former    Packs/day: 1.00    Years: 30.00    Total pack years: 30.00    Types: Cigarettes    Quit date: 2005    Years since quitting: 18.7   Smokeless tobacco: Never  Vaping Use   Vaping Use: Never used  Substance and Sexual Activity   Alcohol use: Yes    Alcohol/week: 12.0 - 14.0 standard drinks of alcohol  Types: 12 - 14 Glasses of wine per week   Drug use: No   Sexual activity: Not Currently  Other Topics Concern   Not on file  Social History Narrative   Not on file   Social Determinants of Health   Financial Resource Strain: Not on file  Food Insecurity: Not on file  Transportation Needs: Not on file  Physical Activity: Not on file  Stress: Not on file  Social Connections: Not on file  Intimate Partner Violence: Not on file    Family History  Problem Relation Age of Onset   Peripheral Artery Disease Father    Diabetes Father      Current Outpatient Medications:    denosumab (PROLIA) 60 MG/ML SOSY injection, Inject 60 mg into the skin every 6 (six) months., Disp: , Rfl:    diltiazem (CARDIZEM CD) 240 MG 24 hr capsule, Take 1 capsule (240 mg total) by mouth daily., Disp: 30 capsule, Rfl: 6   furosemide (LASIX) 20 MG tablet, Take 1 tablet (20 mg total) by mouth daily., Disp: 30 tablet, Rfl: 11   gabapentin (NEURONTIN) 300 MG capsule, Take 300 mg by mouth in the  morning., Disp: , Rfl:    InFLIXimab (REMICADE IV), Inject 10 mg into the vein every 2 (two) months., Disp: , Rfl:    Metoprolol Tartrate 75 MG TABS, Take 150 mg by mouth 2 (two) times daily., Disp: 360 tablet, Rfl: 3   mirtazapine (REMERON) 7.5 MG tablet, Take 1 tablet (7.5 mg total) by mouth at bedtime., Disp: 30 tablet, Rfl: 0   potassium chloride SA (KLOR-CON M20) 20 MEQ tablet, Take 1 tablet (20 mEq total) by mouth daily. Take with furosemide, Disp: 90 tablet, Rfl: 3   predniSONE (DELTASONE) 5 MG tablet, Take 5 mg by mouth daily with breakfast., Disp: , Rfl:    Tiotropium Bromide-Olodaterol (STIOLTO RESPIMAT) 2.5-2.5 MCG/ACT AERS, Inhale 2 puffs into the lungs daily., Disp: 4 g, Rfl: 5 No current facility-administered medications for this visit.  Facility-Administered Medications Ordered in Other Visits:    acetaminophen (TYLENOL) 325 MG tablet, , , ,    diphenhydrAMINE (BENADRYL) 25 mg capsule, , , ,    sodium chloride flush (NS) 0.9 % injection 10 mL, 10 mL, Intracatheter, PRN, Truitt Merle, MD, 10 mL at 09/19/22 1542  PHYSICAL EXAM: ECOG FS:1 - Symptomatic but completely ambulatory  T: 97.5     BP: 116/65    HR: 77    Resp: 17   O2: 94% on RA Physical Exam Vitals and nursing note reviewed.  Constitutional:      Appearance: She is well-developed. She is not ill-appearing or toxic-appearing.  HENT:     Head: Normocephalic.     Comments: Yellow and green bruise on left cheek    Nose: Nose normal.  Eyes:     Conjunctiva/sclera: Conjunctivae normal.  Neck:     Vascular: No JVD.  Cardiovascular:     Rate and Rhythm: Normal rate and regular rhythm.     Pulses: Normal pulses.     Heart sounds: Normal heart sounds.  Pulmonary:     Effort: Pulmonary effort is normal.     Breath sounds: Normal breath sounds.  Abdominal:     General: There is no distension.  Musculoskeletal:     Cervical back: Normal range of motion.  Skin:    General: Skin is warm and dry.     Findings: Rash  present.     Comments: Please see media below.  Faint erythematous macular rash on torso. Blanches.   Neurological:     Mental Status: She is oriented to person, place, and time.           LABORATORY DATA: I have reviewed the data as listed    Latest Ref Rng & Units 09/18/2022   12:17 PM 09/04/2022   11:50 AM 08/07/2022   11:02 AM  CBC  WBC 4.0 - 10.5 K/uL 9.2  5.4  5.7   Hemoglobin 12.0 - 15.0 g/dL 10.0  8.3  9.6   Hematocrit 36.0 - 46.0 % 31.5  26.3  29.1   Platelets 150 - 400 K/uL 172  68  51         Latest Ref Rng & Units 09/18/2022   12:17 PM 09/04/2022   11:50 AM 08/07/2022   11:02 AM  CMP  Glucose 70 - 99 mg/dL 189  149  112   BUN 8 - 23 mg/dL 32  24  18   Creatinine 0.44 - 1.00 mg/dL 1.15  1.03  0.89   Sodium 135 - 145 mmol/L 135  135  137   Potassium 3.5 - 5.1 mmol/L 4.3  4.0  3.7   Chloride 98 - 111 mmol/L 101  102  99   CO2 22 - 32 mmol/L 27  28  32   Calcium 8.9 - 10.3 mg/dL 8.8  8.7  9.6   Total Protein 6.5 - 8.1 g/dL  5.5  6.0   Total Bilirubin 0.3 - 1.2 mg/dL  0.7  0.9   Alkaline Phos 38 - 126 U/L  46  44   AST 15 - 41 U/L  15  15   ALT 0 - 44 U/L  11  17        RADIOGRAPHIC STUDIES (from last 24 hours if applicable) I have personally reviewed the radiological images as listed and agreed with the findings in the report. No results found.      Visit Diagnosis: No diagnosis found.   No orders of the defined types were placed in this encounter.   All questions were answered. The patient knows to call the clinic with any problems, questions or concerns. No barriers to learning was detected.  I have spent a total of 20 minutes minutes of face-to-face and non-face-to-face time, preparing to see the patient, obtaining and/or reviewing separately obtained history, performing a medically appropriate examination, counseling and educating the patient, ordering tests, documenting clinical information in the electronic health record, and care  coordination (communications with other health care professionals or caregivers).    Thank you for allowing me to participate in the care of this patient.    Barrie Folk, PA-C Department of Hematology/Oncology [facility] Phone: [phone]  [phone]    09/19/2022 5:21 PM

## 2022-09-19 NOTE — Patient Instructions (Signed)
[facility]  Discharge Instructions: Thank you for choosing [facility] to provide your oncology and hematology care.   If you have a lab appointment with the [facility], please go directly to the [facility] and check in at the registration area.   Wear comfortable clothing and clothing appropriate for easy access to any Portacath or PICC line.   We strive to give you quality time with your provider. You may need to reschedule your appointment if you arrive late (15 or more minutes).  Arriving late affects you and other patients whose appointments are after yours.  Also, if you miss three or more appointments without notifying the office, you may be dismissed from the clinic at the provider's discretion.      For prescription refill requests, have your pharmacy contact our office and allow 72 hours for refills to be completed.    Today you received the following chemotherapy and/or immunotherapy agents: Vidaza.       To help prevent nausea and vomiting after your treatment, we encourage you to take your nausea medication as directed.  BELOW ARE SYMPTOMS THAT SHOULD BE REPORTED IMMEDIATELY: *FEVER GREATER THAN 100.4 F (38 C) OR HIGHER *CHILLS OR SWEATING *NAUSEA AND VOMITING THAT IS NOT CONTROLLED WITH YOUR NAUSEA MEDICATION *UNUSUAL SHORTNESS OF BREATH *UNUSUAL BRUISING OR BLEEDING *URINARY PROBLEMS (pain or burning when urinating, or frequent urination) *BOWEL PROBLEMS (unusual diarrhea, constipation, pain near the anus) TENDERNESS IN MOUTH AND THROAT WITH OR WITHOUT PRESENCE OF ULCERS (sore throat, sores in mouth, or a toothache) UNUSUAL RASH, SWELLING OR PAIN  UNUSUAL VAGINAL DISCHARGE OR ITCHING   Items with * indicate a potential emergency and should be followed up as soon as possible or go to the Emergency Department if any problems should occur.  Please show the CHEMOTHERAPY ALERT CARD or IMMUNOTHERAPY ALERT CARD at check-in to  the Emergency Department and triage nurse.  Should you have questions after your visit or need to cancel or reschedule your appointment, please contact [facility]  Dept: [phone]  and follow the prompts.  Office hours are 8:00 a.m. to 4:30 p.m. Monday - Friday. Please note that voicemails left after 4:00 p.m. may not be returned until the following business day.  We are closed weekends and major holidays. You have access to a nurse at all times for urgent questions. Please call the main number to the clinic Dept: [phone] and follow the prompts.   For any non-urgent questions, you may also contact your provider using MyChart. We now offer e-Visits for anyone 49 and older to request care online for non-urgent symptoms. For details visit [email or web address].   Also download the MyChart app! Go to the app store, search "MyChart", open the app, select , and log in with your MyChart username and password.  Masks are optional in the cancer centers. If you would like for your care team to wear a mask while they are taking care of you, please let them know. You may have one support person who is at least 76 years old accompany you for your appointments.

## 2022-09-19 NOTE — Progress Notes (Signed)
Following completion of Vidaza infusion, pt began c/o itching on back and chest. Pt said she had experienced a rash in these areas since 9/25, but did not let RN know prior to completing infusion. Kaitlyn PA notified. PA came to bedside to assess rash. IV Pepcid given per PA. Pt to take Claritin at home and notify clinic if rash worsens. Pt expressed understanding.

## 2022-09-19 NOTE — Telephone Encounter (Signed)
Confirmed 10/12 appointment

## 2022-09-20 ENCOUNTER — Encounter: Payer: Self-pay | Admitting: Adult Health

## 2022-09-20 ENCOUNTER — Encounter (HOSPITAL_BASED_OUTPATIENT_CLINIC_OR_DEPARTMENT_OTHER): Payer: Medicare Other | Attending: Physician Assistant | Admitting: General Surgery

## 2022-09-20 ENCOUNTER — Inpatient Hospital Stay: Payer: Medicare Other

## 2022-09-20 ENCOUNTER — Encounter: Payer: Self-pay | Admitting: Hematology

## 2022-09-20 ENCOUNTER — Inpatient Hospital Stay (HOSPITAL_BASED_OUTPATIENT_CLINIC_OR_DEPARTMENT_OTHER): Payer: Medicare Other | Admitting: Physician Assistant

## 2022-09-20 VITALS — BP 110/75 | HR 91 | Temp 98.0°F | Resp 16

## 2022-09-20 DIAGNOSIS — J449 Chronic obstructive pulmonary disease, unspecified: Secondary | ICD-10-CM | POA: Diagnosis not present

## 2022-09-20 DIAGNOSIS — M069 Rheumatoid arthritis, unspecified: Secondary | ICD-10-CM | POA: Insufficient documentation

## 2022-09-20 DIAGNOSIS — S9031XA Contusion of right foot, initial encounter: Secondary | ICD-10-CM | POA: Insufficient documentation

## 2022-09-20 DIAGNOSIS — L97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 evidence of necrosis: Secondary | ICD-10-CM | POA: Diagnosis not present

## 2022-09-20 DIAGNOSIS — I5022 Chronic systolic (congestive) heart failure: Secondary | ICD-10-CM | POA: Diagnosis not present

## 2022-09-20 DIAGNOSIS — Z7952 Long term (current) use of systemic steroids: Secondary | ICD-10-CM | POA: Insufficient documentation

## 2022-09-20 DIAGNOSIS — S80811A Abrasion, right lower leg, initial encounter: Secondary | ICD-10-CM | POA: Diagnosis not present

## 2022-09-20 DIAGNOSIS — C931 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: Secondary | ICD-10-CM | POA: Diagnosis not present

## 2022-09-20 DIAGNOSIS — I130 Hypertensive heart and chronic kidney disease with heart failure and stage 1 through stage 4 chronic kidney disease, or unspecified chronic kidney disease: Secondary | ICD-10-CM | POA: Diagnosis not present

## 2022-09-20 DIAGNOSIS — R21 Rash and other nonspecific skin eruption: Secondary | ICD-10-CM

## 2022-09-20 DIAGNOSIS — E440 Moderate protein-calorie malnutrition: Secondary | ICD-10-CM | POA: Insufficient documentation

## 2022-09-20 DIAGNOSIS — I4891 Unspecified atrial fibrillation: Secondary | ICD-10-CM | POA: Insufficient documentation

## 2022-09-20 DIAGNOSIS — N183 Chronic kidney disease, stage 3 unspecified: Secondary | ICD-10-CM | POA: Insufficient documentation

## 2022-09-20 DIAGNOSIS — I110 Hypertensive heart disease with heart failure: Secondary | ICD-10-CM | POA: Diagnosis not present

## 2022-09-20 DIAGNOSIS — L97512 Non-pressure chronic ulcer of other part of right foot with fat layer exposed: Secondary | ICD-10-CM | POA: Diagnosis not present

## 2022-09-20 DIAGNOSIS — L97812 Non-pressure chronic ulcer of other part of right lower leg with fat layer exposed: Secondary | ICD-10-CM | POA: Diagnosis not present

## 2022-09-20 DIAGNOSIS — I509 Heart failure, unspecified: Secondary | ICD-10-CM | POA: Diagnosis not present

## 2022-09-20 DIAGNOSIS — Z5111 Encounter for antineoplastic chemotherapy: Secondary | ICD-10-CM | POA: Diagnosis not present

## 2022-09-20 DIAGNOSIS — I872 Venous insufficiency (chronic) (peripheral): Secondary | ICD-10-CM | POA: Diagnosis not present

## 2022-09-20 DIAGNOSIS — D696 Thrombocytopenia, unspecified: Secondary | ICD-10-CM

## 2022-09-20 LAB — CBC WITH DIFFERENTIAL/PLATELET
Abs Immature Granulocytes: 0.07 10*3/uL (ref 0.00–0.07)
Basophils Absolute: 0 10*3/uL (ref 0.0–0.1)
Basophils Relative: 0 %
Eosinophils Absolute: 0 10*3/uL (ref 0.0–0.5)
Eosinophils Relative: 0 %
HCT: 28.6 % — ABNORMAL LOW (ref 36.0–46.0)
Hemoglobin: 9.1 g/dL — ABNORMAL LOW (ref 12.0–15.0)
Immature Granulocytes: 1 %
Lymphocytes Relative: 12 %
Lymphs Abs: 1 10*3/uL (ref 0.7–4.0)
MCH: 34.6 pg — ABNORMAL HIGH (ref 26.0–34.0)
MCHC: 31.8 g/dL (ref 30.0–36.0)
MCV: 108.7 fL — ABNORMAL HIGH (ref 80.0–100.0)
Monocytes Absolute: 1.8 10*3/uL — ABNORMAL HIGH (ref 0.1–1.0)
Monocytes Relative: 21 %
Neutro Abs: 5.6 10*3/uL (ref 1.7–7.7)
Neutrophils Relative %: 66 %
Platelets: 123 10*3/uL — ABNORMAL LOW (ref 150–400)
RBC: 2.63 MIL/uL — ABNORMAL LOW (ref 3.87–5.11)
RDW: 17 % — ABNORMAL HIGH (ref 11.5–15.5)
WBC: 8.5 10*3/uL (ref 4.0–10.5)
nRBC: 0 % (ref 0.0–0.2)

## 2022-09-20 MED ORDER — SODIUM CHLORIDE 0.9 % IV SOLN
75.0000 mg/m2 | Freq: Once | INTRAVENOUS | Status: AC
  Administered 2022-09-20: 115 mg via INTRAVENOUS
  Filled 2022-09-20: qty 11.5

## 2022-09-20 MED ORDER — FAMOTIDINE IN NACL 20-0.9 MG/50ML-% IV SOLN
20.0000 mg | Freq: Once | INTRAVENOUS | Status: AC
  Administered 2022-09-20: 20 mg via INTRAVENOUS
  Filled 2022-09-20: qty 50

## 2022-09-20 MED ORDER — SODIUM CHLORIDE 0.9% FLUSH
10.0000 mL | INTRAVENOUS | Status: DC | PRN
  Administered 2022-09-20: 10 mL

## 2022-09-20 MED ORDER — HEPARIN SOD (PORK) LOCK FLUSH 100 UNIT/ML IV SOLN
500.0000 [IU] | Freq: Once | INTRAVENOUS | Status: AC | PRN
  Administered 2022-09-20: 500 [IU]

## 2022-09-20 MED ORDER — SODIUM CHLORIDE 0.9 % IV SOLN: Freq: Once | INTRAVENOUS | Status: AC

## 2022-09-20 NOTE — Progress Notes (Signed)
Symptom Management Consult note [facility]    Patient Care Team: [name], MD as PCP - General (Family Medicine) [name], Wonda Cheng, MD as PCP - Cardiology (Cardiology) Gavin Pound, MD as Consulting Physician (Rheumatology) Truitt Merle, MD as Consulting Physician (Hematology) Juanita Craver, MD as Consulting Physician (Gastroenterology) Garner Nash, DO as Consulting Physician (Pulmonary Disease)    Name of the patient: [name]  [id number]  1946/03/18   Date of visit: 09/20/2022   Chief Complaint/Reason for visit: recheck rash   Current Therapy: Vidaza  Last treatment:  Day 2   Cycle 2 on 09/19/22   ASSESSMENT & PLAN: Patient is a 76 y.o. 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 Burr Medico.  I have viewed most recent oncology note and lab work.    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 Patient with out signs of active/easy bleeding, no abnormal bruising. 123 is improved compared to last month range was 29-68 -Will continue to be monitored at toxicity checks.   #) Rsh -Patient non toxic appearing. Rash on chest improved, still present on back.  -Rash improving with PO Claritin. She received IV Pepcid in infusion today. -Recommend she continue Claritin until rash resolves. -Strict ED precautions discussed should symptoms worsen.     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  Please see concurrent tissue biopsy (below) for  additional work-up and final diagnosis.    FINAL MICROSCOPIC DIAGNOSIS:   A. SOFT TISSUE MASS, PRE SACRAL, NEEDLE CORE BIOPSY:  -  Chronic lymphocytic leukemia/small lymphocytic lymphoma  -  Extra medullary hematopoiesis  -  See comment   COMMENT:  The biopsy consists of multiple soft tissue cores with lymphoid nodules and a dense hematopoietic infiltrate consistent with extra medullary hematopoiesis.  MPO and E-cadherin highlight myeloid and erythroid precursors respectively.  CD34 highlights increased vasculature and is also positive within the cytoplasm of megakaryocytes.  A few small, immature mononuclear cells appear to be positive for CD34 and CD117. TdT shows rare, scattered positive cells.  CD20 highlights aggregates of B cells which are admixed with CD3 positive T cells.  T cells are an admixture of CD4 and CD8.  The B cells are also positive for CD5, CD23 and Bcl-2.  The B cells do not show significant staining for CD10, BCL6 or cyclin D1.  CD138 highlights scattered plasma cells which are polytypic by kappa and lambda in situ hybridization.  Flow cytometry performed on the sample (see WL [id number]) identified a kappa restricted CD5 positive B-cell population comprising 70% of lymphocytes.  In addition, a small myeloblast population comprised 2% of the total cellular events.   Overall, the findings are consistent with soft tissue involvement by  chronic lymphocytic leukemia/small lymphocytic lymphoma and extra medullary hematopoiesis. In reviewing the patient's CBC data (macrocytic anemia and thrombocytopenia), I would recommend a bone marrow biopsy to assess for marrow involvement by CLL/SLL.    12/23/2021 Imaging   EXAM: CT CHEST, ABDOMEN,  AND PELVIS WITH CONTRAST  IMPRESSION: 1. Slight interval enlargement of a presacral soft tissue mass measuring 7.2 x 4.7 cm, previously 6.9 x 4.1 cm on prior MR of the pelvis dated 07/04/2021. By report, this represents a biopsy  proven lymphoma. 2. Pleural nodule of the dependent right lower lobe overlying the posterior right tenth rib and pleural or paraspinous soft tissue mass overlying the right aspect of the T10 vertebral body, very slightly enlarged compared to prior examination of the chest dated 06/25/2020, consistent with additional sites of lymphomatous involvement given very indolent growth. These could be better assessed for metabolic activity by FDG PET/CT if desired. 3. There is mild, bibasilar predominant pulmonary fibrosis in a pattern featuring irregular peripheral interstitial opacity, septal thickening, but without clear evidence of subpleural bronchiolectasis or honeycombing, with a somewhat asymmetric distribution most conspicuously involving the right lower lobe and lingula. These findings are significantly worsened when compared to prior examination dated 06/25/2020, particularly in the right lower lobe. Given interval change, this may reflect sequelae of interval infection or aspiration, however appearance is generally suspicious for fibrotic interstitial lung disease, and if characterized by ATS pulmonary fibrosis is in an "indeterminate for UIP" pattern, differential considerations including both UIP and NSIP. 4. Emphysema.   Aortic Atherosclerosis (ICD10-I70.0) and Emphysema (ICD10-J43.9).   01/05/2022 Pathology Results   DIAGNOSIS:   BONE MARROW, ASPIRATE, CLOT, CORE:  -Hypercellular bone marrow for age with features of  myelodysplastic/myeloproliferative neoplasm  -Minor abnormal B-cell population  -See comment   PERIPHERAL BLOOD:  -Macrocytic anemia  -Neutrophilic left shift and monocytosis  -Thrombocytopenia   COMMENT:  The bone marrow is hypercellular for age with dyspoietic changes  involving myeloid cell lines associated with monocytosis and increased number of blastic cells (12%) as primarily seen by morphology, many of which display monocytic features.  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  In this background, there are several predominantly small lymphoid aggregates mostly composed of small lymphoid cells.  By flow cytometry, a minor abnormal B-cell population expressing CD5 is seen and representing 2% of all cells.  This correlate with previously known B-cell lymphoproliferative process.  Correlation with cytogenetic and FISH studies is strongly recommended.    DIAGNOSIS:   -Increased number of monocytic cells present (25%)  -Minor abnormal B-cell population identified.  -See comment   COMMENT:  Flow cytometric analysis shows increased number of monocytic cells representing 25% of all cells but without aberrant phenotype or CD34 expression.  A significant CD34-positive blastic population is not identified.  The lymphoid population shows a minor B-cell population representing 2% of all cells and expressing B-cell antigens including CD20 associated with CD5, CD200 and possibly dim kappa expression.  The latter findings are abnormal and correlate with previously known B-cell lymphoproliferative process.  No significant T-cell phenotypic abnormalities identified.    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 female with  oncologic history as above seen in the infusion center today with chief complaint of recheck rash. Patient seen yesterday in infusion for rash on torso with associated pruritis. Patient reports rash on her chest and abdomen has improved. She did not notice it this morning when getting dressed. It is still present on her back although itching has improved. She took Claritin this morning. She denies any abnormal bruising or easy bleeding. She feels "well overall" she tells me. She is happy the rash is improving.    ROS  All other systems are reviewed and are negative for acute change except as noted in the HPI.    Allergies  Allergen Reactions   Digoxin And Related Rash    Generalized total body rash with immense itching   Gluten Meal Diarrhea   Penicillins Rash and Other (See Comments)      Did it involve swelling of the face/tongue/throat, SOB, or low BP? No Did it involve sudden or severe rash/hives, skin peeling, or any reaction on the inside of your mouth or nose? Yes Did you need to seek medical attention at a hospital or doctor's office? No When did it last happen?    "Many Years Ago"  If all above answers are "NO", may proceed with cephalosporin use.      Alendronate Rash   Hydroxychloroquine Rash     Past Medical History:  Diagnosis Date   Arthritis    Rheumatoid arthritis   Celiac disease    Chronic kidney disease    stage 3 from MD notes   COPD (chronic obstructive pulmonary disease) (HCC)    Dyspnea    with going up stairs   Family history of adverse reaction to anesthesia    father had hard time waking up   Headache    sinus headaches   Hot flashes    Hypertension    Iron deficiency anemia    Pneumonia    per patient "I have walking pneumonia"     Past Surgical History:  Procedure Laterality Date   COLONOSCOPY     ECTOPIC PREGNANCY SURGERY      x 2   IR IMAGING GUIDED PORT INSERTION  01/31/2022   REVERSE SHOULDER ARTHROPLASTY Right 02/02/2017    Procedure: RIGHT REVERSE SHOULDER ARTHROPLASTY;  Surgeon: Netta Cedars, MD;  Location: [facility];  Service: Orthopedics;  Laterality: Right;    Social History   Socioeconomic History   Marital status: Married    Spouse name: Not on file   Number of children: Not on file   Years of education: Not on file   Highest education level: Not on file  Occupational History   Not on file  Tobacco Use   Smoking status: Former    Packs/day: 1.00    Years: 30.00    Total pack years: 30.00    Types: Cigarettes    Quit date: 2005    Years since quitting: 18.7   Smokeless tobacco: Never  Vaping Use   Vaping Use: Never used  Substance and Sexual Activity   Alcohol use: Yes    Alcohol/week: 12.0 - 14.0 standard drinks of alcohol    Types: 12 - 14 Glasses of wine per week   Drug use: No   Sexual activity: Not Currently  Other Topics Concern   Not on file  Social History Narrative   Not on file   Social Determinants of Health   Financial Resource  Strain: Not on file  Food Insecurity: Not on file  Transportation Needs: Not on file  Physical Activity: Not on file  Stress: Not on file  Social Connections: Not on file  Intimate Partner Violence: Not on file    Family History  Problem Relation Age of Onset   Peripheral Artery Disease Father    Diabetes Father      Current Outpatient Medications:    denosumab (PROLIA) 60 MG/ML SOSY injection, Inject 60 mg into the skin every 6 (six) months., Disp: , Rfl:    diltiazem (CARDIZEM CD) 240 MG 24 hr capsule, Take 1 capsule (240 mg total) by mouth daily., Disp: 30 capsule, Rfl: 6   furosemide (LASIX) 20 MG tablet, Take 1 tablet (20 mg total) by mouth daily., Disp: 30 tablet, Rfl: 11   gabapentin (NEURONTIN) 300 MG capsule, Take 300 mg by mouth in the morning., Disp: , Rfl:    InFLIXimab (REMICADE IV), Inject 10 mg into the vein every 2 (two) months., Disp: , Rfl:    Metoprolol Tartrate 75 MG TABS, Take 150 mg by mouth 2 (two) times daily., Disp:  360 tablet, Rfl: 3   mirtazapine (REMERON) 7.5 MG tablet, Take 1 tablet (7.5 mg total) by mouth at bedtime., Disp: 30 tablet, Rfl: 0   potassium chloride SA (KLOR-CON M20) 20 MEQ tablet, Take 1 tablet (20 mEq total) by mouth daily. Take with furosemide, Disp: 90 tablet, Rfl: 3   predniSONE (DELTASONE) 5 MG tablet, Take 5 mg by mouth daily with breakfast., Disp: , Rfl:    Tiotropium Bromide-Olodaterol (STIOLTO RESPIMAT) 2.5-2.5 MCG/ACT AERS, Inhale 2 puffs into the lungs daily., Disp: 4 g, Rfl: 5 No current facility-administered medications for this visit.  Facility-Administered Medications Ordered in Other Visits:    acetaminophen (TYLENOL) 325 MG tablet, , , ,    diphenhydrAMINE (BENADRYL) 25 mg capsule, , , ,    sodium chloride flush (NS) 0.9 % injection 10 mL, 10 mL, Intracatheter, PRN, Truitt Merle, MD, 10 mL at 09/20/22 1445  PHYSICAL EXAM: ECOG FS:1 - Symptomatic but completely ambulatory  T: 98   BP: 110/75   HR: 91   Resp: 16   O2: 98% RA Physical Exam Vitals and nursing note reviewed.  Constitutional:      Appearance: She is well-developed. She is not ill-appearing or toxic-appearing.  HENT:     Head: Normocephalic.     Comments: Yellow and green bruise covering left cheek    Nose: Nose normal.     Mouth/Throat:     Comments: No oral lesions Eyes:     Conjunctiva/sclera: Conjunctivae normal.  Neck:     Vascular: No JVD.  Cardiovascular:     Rate and Rhythm: Normal rate and regular rhythm.     Pulses: Normal pulses.     Heart sounds: Normal heart sounds.  Pulmonary:     Effort: Pulmonary effort is normal.     Breath sounds: Normal breath sounds.  Abdominal:     General: There is no distension.  Musculoskeletal:     Cervical back: Normal range of motion.  Skin:    General: Skin is warm and dry.     Findings: Rash (faint macular rash on back) present.  Neurological:     Mental Status: She is oriented to person, place, and time.        LABORATORY DATA: I have  reviewed the data as listed    Latest Ref Rng & Units 09/20/2022    1:59 PM  09/18/2022   12:17 PM 09/04/2022   11:50 AM  CBC  WBC 4.0 - 10.5 K/uL 8.5  9.2  5.4   Hemoglobin 12.0 - 15.0 g/dL 9.1  10.0  8.3   Hematocrit 36.0 - 46.0 % 28.6  31.5  26.3   Platelets 150 - 400 K/uL 123  172  68         Latest Ref Rng & Units 09/18/2022   12:17 PM 09/04/2022   11:50 AM 08/07/2022   11:02 AM  CMP  Glucose 70 - 99 mg/dL 189  149  112   BUN 8 - 23 mg/dL 32  24  18   Creatinine 0.44 - 1.00 mg/dL 1.15  1.03  0.89   Sodium 135 - 145 mmol/L 135  135  137   Potassium 3.5 - 5.1 mmol/L 4.3  4.0  3.7   Chloride 98 - 111 mmol/L 101  102  99   CO2 22 - 32 mmol/L 27  28  32   Calcium 8.9 - 10.3 mg/dL 8.8  8.7  9.6   Total Protein 6.5 - 8.1 g/dL  5.5  6.0   Total Bilirubin 0.3 - 1.2 mg/dL  0.7  0.9   Alkaline Phos 38 - 126 U/L  46  44   AST 15 - 41 U/L  15  15   ALT 0 - 44 U/L  11  17        RADIOGRAPHIC STUDIES (from last 24 hours if applicable) I have personally reviewed the radiological images as listed and agreed with the findings in the report. No results found.      Visit Diagnosis: 1. Rash   2. 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 ([place])   3. Thrombocytopenia ([place])      No orders of the defined types were placed in this encounter.   All questions were answered. The patient knows to call the clinic with any problems, questions or concerns. No barriers to learning was detected.  I have spent a total of 20 minutes minutes of face-to-face and non-face-to-face time, preparing to see the patient, obtaining and/or reviewing separately obtained history, performing a medically appropriate examination, counseling and educating the patient, ordering tests, documenting clinical information in the electronic health record.  Thank you for allowing me to participate in the care of this patient.    Barrie Folk, PA-C Department of Hematology/Oncology [facility] at [facility] Phone: [phone]  [phone]    09/20/2022 4:01 PM

## 2022-09-20 NOTE — Progress Notes (Signed)
Patient states that she does not want premedications today. She states that "they don't work well with my body."

## 2022-09-20 NOTE — Patient Instructions (Signed)
[facility]  Discharge Instructions: Thank you for choosing [facility] to provide your oncology and hematology care.   If you have a lab appointment with the [facility], please go directly to the [facility] and check in at the registration area.   Wear comfortable clothing and clothing appropriate for easy access to any Portacath or PICC line.   We strive to give you quality time with your provider. You may need to reschedule your appointment if you arrive late (15 or more minutes).  Arriving late affects you and other patients whose appointments are after yours.  Also, if you miss three or more appointments without notifying the office, you may be dismissed from the clinic at the provider's discretion.      For prescription refill requests, have your pharmacy contact our office and allow 72 hours for refills to be completed.    Today you received the following chemotherapy and/or immunotherapy agents: Vidaza.       To help prevent nausea and vomiting after your treatment, we encourage you to take your nausea medication as directed.  BELOW ARE SYMPTOMS THAT SHOULD BE REPORTED IMMEDIATELY: *FEVER GREATER THAN 100.4 F (38 C) OR HIGHER *CHILLS OR SWEATING *NAUSEA AND VOMITING THAT IS NOT CONTROLLED WITH YOUR NAUSEA MEDICATION *UNUSUAL SHORTNESS OF BREATH *UNUSUAL BRUISING OR BLEEDING *URINARY PROBLEMS (pain or burning when urinating, or frequent urination) *BOWEL PROBLEMS (unusual diarrhea, constipation, pain near the anus) TENDERNESS IN MOUTH AND THROAT WITH OR WITHOUT PRESENCE OF ULCERS (sore throat, sores in mouth, or a toothache) UNUSUAL RASH, SWELLING OR PAIN  UNUSUAL VAGINAL DISCHARGE OR ITCHING   Items with * indicate a potential emergency and should be followed up as soon as possible or go to the Emergency Department if any problems should occur.  Please show the CHEMOTHERAPY ALERT CARD or IMMUNOTHERAPY ALERT CARD at check-in to  the Emergency Department and triage nurse.  Should you have questions after your visit or need to cancel or reschedule your appointment, please contact [facility]  Dept: [phone]  and follow the prompts.  Office hours are 8:00 a.m. to 4:30 p.m. Monday - Friday. Please note that voicemails left after 4:00 p.m. may not be returned until the following business day.  We are closed weekends and major holidays. You have access to a nurse at all times for urgent questions. Please call the main number to the clinic Dept: [phone] and follow the prompts.   For any non-urgent questions, you may also contact your provider using MyChart. We now offer e-Visits for anyone 82 and older to request care online for non-urgent symptoms. For details visit [email or web address].   Also download the MyChart app! Go to the app store, search "MyChart", open the app, select [facility], and log in with your MyChart username and password.  Masks are optional in the cancer centers. If you would like for your care team to wear a mask while they are taking care of you, please let them know. You may have one support person who is at least 76 years old accompany you for your appointments.

## 2022-09-20 NOTE — Assessment & Plan Note (Addendum)
[name] is a 77 year old woman with CMML currently undergoing treatment with azacitidine.  [name] is due to receive her azacitidine today.  Her labs are stable with slightly decreased platelet count.  I reviewed her labs with Dr. Morey Hummingbird along with the ulceration on the top of her foot that does not appear acutely infected.  Dr. Morey Hummingbird looked at the picture and reviewed her labs with me and patient will proceed with treatment today.  [name] will return in 2 weeks for labs and follow-up.

## 2022-09-21 ENCOUNTER — Inpatient Hospital Stay: Payer: Medicare Other

## 2022-09-21 ENCOUNTER — Inpatient Hospital Stay: Payer: Medicare Other | Admitting: Dietician

## 2022-09-21 VITALS — BP 124/69 | HR 76 | Temp 97.5°F | Resp 17

## 2022-09-21 DIAGNOSIS — C931 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: Secondary | ICD-10-CM

## 2022-09-21 DIAGNOSIS — Z5111 Encounter for antineoplastic chemotherapy: Secondary | ICD-10-CM | POA: Diagnosis not present

## 2022-09-21 DIAGNOSIS — C9590 Leukemia, unspecified not having achieved remission: Secondary | ICD-10-CM | POA: Diagnosis not present

## 2022-09-21 DIAGNOSIS — I509 Heart failure, unspecified: Secondary | ICD-10-CM | POA: Diagnosis not present

## 2022-09-21 DIAGNOSIS — I4891 Unspecified atrial fibrillation: Secondary | ICD-10-CM | POA: Diagnosis not present

## 2022-09-21 DIAGNOSIS — J449 Chronic obstructive pulmonary disease, unspecified: Secondary | ICD-10-CM | POA: Diagnosis not present

## 2022-09-21 DIAGNOSIS — I130 Hypertensive heart and chronic kidney disease with heart failure and stage 1 through stage 4 chronic kidney disease, or unspecified chronic kidney disease: Secondary | ICD-10-CM | POA: Diagnosis not present

## 2022-09-21 DIAGNOSIS — I110 Hypertensive heart disease with heart failure: Secondary | ICD-10-CM | POA: Diagnosis not present

## 2022-09-21 DIAGNOSIS — I5022 Chronic systolic (congestive) heart failure: Secondary | ICD-10-CM | POA: Diagnosis not present

## 2022-09-21 DIAGNOSIS — D631 Anemia in chronic kidney disease: Secondary | ICD-10-CM | POA: Diagnosis not present

## 2022-09-21 DIAGNOSIS — N1831 Chronic kidney disease, stage 3a: Secondary | ICD-10-CM | POA: Diagnosis not present

## 2022-09-21 MED ORDER — SODIUM CHLORIDE 0.9 % IV SOLN
75.0000 mg/m2 | Freq: Once | INTRAVENOUS | Status: AC
  Administered 2022-09-21: 115 mg via INTRAVENOUS
  Filled 2022-09-21: qty 11.5

## 2022-09-21 MED ORDER — HEPARIN SOD (PORK) LOCK FLUSH 100 UNIT/ML IV SOLN
500.0000 [IU] | Freq: Once | INTRAVENOUS | Status: AC | PRN
  Administered 2022-09-21: 500 [IU]

## 2022-09-21 MED ORDER — SODIUM CHLORIDE 0.9% FLUSH
10.0000 mL | INTRAVENOUS | Status: DC | PRN
  Administered 2022-09-21: 10 mL

## 2022-09-21 MED ORDER — SODIUM CHLORIDE 0.9 % IV SOLN: Freq: Once | INTRAVENOUS | Status: AC

## 2022-09-21 NOTE — Patient Instructions (Signed)
[facility]  Discharge Instructions: Thank you for choosing [facility] to provide your oncology and hematology care.   If you have a lab appointment with the [facility], please go directly to the [facility] and check in at the registration area.   Wear comfortable clothing and clothing appropriate for easy access to any Portacath or PICC line.   We strive to give you quality time with your provider. You may need to reschedule your appointment if you arrive late (15 or more minutes).  Arriving late affects you and other patients whose appointments are after yours.  Also, if you miss three or more appointments without notifying the office, you may be dismissed from the clinic at the provider's discretion.      For prescription refill requests, have your pharmacy contact our office and allow 72 hours for refills to be completed.    Today you received the following chemotherapy and/or immunotherapy agents: Vidaza      To help prevent nausea and vomiting after your treatment, we encourage you to take your nausea medication as directed.  BELOW ARE SYMPTOMS THAT SHOULD BE REPORTED IMMEDIATELY: *FEVER GREATER THAN 100.4 F (38 C) OR HIGHER *CHILLS OR SWEATING *NAUSEA AND VOMITING THAT IS NOT CONTROLLED WITH YOUR NAUSEA MEDICATION *UNUSUAL SHORTNESS OF BREATH *UNUSUAL BRUISING OR BLEEDING *URINARY PROBLEMS (pain or burning when urinating, or frequent urination) *BOWEL PROBLEMS (unusual diarrhea, constipation, pain near the anus) TENDERNESS IN MOUTH AND THROAT WITH OR WITHOUT PRESENCE OF ULCERS (sore throat, sores in mouth, or a toothache) UNUSUAL RASH, SWELLING OR PAIN  UNUSUAL VAGINAL DISCHARGE OR ITCHING   Items with * indicate a potential emergency and should be followed up as soon as possible or go to the Emergency Department if any problems should occur.  Please show the CHEMOTHERAPY ALERT CARD or IMMUNOTHERAPY ALERT CARD at check-in to the  Emergency Department and triage nurse.  Should you have questions after your visit or need to cancel or reschedule your appointment, please contact [facility]  Dept: [phone]  and follow the prompts.  Office hours are 8:00 a.m. to 4:30 p.m. Monday - Friday. Please note that voicemails left after 4:00 p.m. may not be returned until the following business day.  We are closed weekends and major holidays. You have access to a nurse at all times for urgent questions. Please call the main number to the clinic Dept: [phone] and follow the prompts.   For any non-urgent questions, you may also contact your provider using MyChart. We now offer e-Visits for anyone 11 and older to request care online for non-urgent symptoms. For details visit [email or web address].   Also download the MyChart app! Go to the app store, search "MyChart", open the app, select [facility], and log in with your MyChart username and password.  Masks are optional in the cancer centers. If you would like for your care team to wear a mask while they are taking care of you, please let them know. You may have one support person who is at least 76 years old accompany you for your appointments.

## 2022-09-21 NOTE — Progress Notes (Signed)
Nutrition Follow-up:  Patient with CMML. She is receiving azacitidine q28d.   Met with patient during infusion. Observed bruising to left orbital/buccal area. Patient states she fell in a parking lot last week. Patient says she has fallen 3x in the last month. The other 2 were at home. Patient says her knees are weak and keep buckling. She is currently working with PT. Patient with paper towel wrapped with painters tape around right foot. She reports fluid is leaking from her wound s/p visit to wound clinic yesterday. She has asked RN to apply dressing to this before leaving infusion. Patient reports her appetite is the same. She is eating 2 meals and drinking one Boost. Patient sometimes will snack on pork rinds. Yesterday she had blueberry pancakes, kiwi, eggs for breakfast, fried fish, fried okra/squash, and rice for supper. Patient denies nutrition impact symptoms.  Medications: reviewed   Labs: 9/27 - Hgb 9.1  Anthropometrics: Wt 111 lb 6.4 oz on 9/25 decreased  9/5 - 114 lb 12.8 oz 8/14 - 116 lb 11.2 oz   ~3% wt loss in last 20 days; significant   NUTRITION DIAGNOSIS: Inadequate oral intake continues    INTERVENTION:  Educated on increased calorie/protein needs to support healing Recommend increasing Boost - 2x/day Encouraged high protein snacks in between meals and at bedtime - handout with ideas provided     MONITORING, EVALUATION, GOAL: weight trends, intake    NEXT VISIT: Thursday November 9 during infusion     

## 2022-09-21 NOTE — Progress Notes (Signed)
Pt declines zofran today

## 2022-09-22 ENCOUNTER — Encounter (HOSPITAL_BASED_OUTPATIENT_CLINIC_OR_DEPARTMENT_OTHER): Payer: Medicare Other | Admitting: General Surgery

## 2022-09-22 ENCOUNTER — Inpatient Hospital Stay: Payer: Medicare Other

## 2022-09-22 VITALS — BP 112/67 | HR 72 | Temp 97.8°F | Resp 17

## 2022-09-22 DIAGNOSIS — L97812 Non-pressure chronic ulcer of other part of right lower leg with fat layer exposed: Secondary | ICD-10-CM | POA: Diagnosis not present

## 2022-09-22 DIAGNOSIS — N183 Chronic kidney disease, stage 3 unspecified: Secondary | ICD-10-CM | POA: Diagnosis not present

## 2022-09-22 DIAGNOSIS — C931 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: Secondary | ICD-10-CM | POA: Diagnosis not present

## 2022-09-22 DIAGNOSIS — M069 Rheumatoid arthritis, unspecified: Secondary | ICD-10-CM | POA: Diagnosis not present

## 2022-09-22 DIAGNOSIS — I5022 Chronic systolic (congestive) heart failure: Secondary | ICD-10-CM | POA: Diagnosis not present

## 2022-09-22 DIAGNOSIS — J449 Chronic obstructive pulmonary disease, unspecified: Secondary | ICD-10-CM | POA: Diagnosis not present

## 2022-09-22 DIAGNOSIS — Z5111 Encounter for antineoplastic chemotherapy: Secondary | ICD-10-CM | POA: Diagnosis not present

## 2022-09-22 DIAGNOSIS — I130 Hypertensive heart and chronic kidney disease with heart failure and stage 1 through stage 4 chronic kidney disease, or unspecified chronic kidney disease: Secondary | ICD-10-CM | POA: Diagnosis not present

## 2022-09-22 DIAGNOSIS — I509 Heart failure, unspecified: Secondary | ICD-10-CM | POA: Diagnosis not present

## 2022-09-22 DIAGNOSIS — I4891 Unspecified atrial fibrillation: Secondary | ICD-10-CM | POA: Diagnosis not present

## 2022-09-22 DIAGNOSIS — L97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 evidence of necrosis: Secondary | ICD-10-CM | POA: Diagnosis not present

## 2022-09-22 DIAGNOSIS — I110 Hypertensive heart disease with heart failure: Secondary | ICD-10-CM | POA: Diagnosis not present

## 2022-09-22 MED ORDER — SODIUM CHLORIDE 0.9 % IV SOLN
75.0000 mg/m2 | Freq: Once | INTRAVENOUS | Status: AC
  Administered 2022-09-22: 115 mg via INTRAVENOUS
  Filled 2022-09-22: qty 11.5

## 2022-09-22 MED ORDER — ONDANSETRON HCL 8 MG PO TABS
8.0000 mg | ORAL_TABLET | Freq: Once | ORAL | Status: DC
  Filled 2022-09-22: qty 1

## 2022-09-22 MED ORDER — SODIUM CHLORIDE 0.9 % IV SOLN: Freq: Once | INTRAVENOUS | Status: AC

## 2022-09-22 NOTE — Patient Instructions (Signed)
[facility]  Discharge Instructions: Thank you for choosing [facility] to provide your oncology and hematology care.   If you have a lab appointment with the [facility], please go directly to the [facility] and check in at the registration area.   Wear comfortable clothing and clothing appropriate for easy access to any Portacath or PICC line.   We strive to give you quality time with your provider. You may need to reschedule your appointment if you arrive late (15 or more minutes).  Arriving late affects you and other patients whose appointments are after yours.  Also, if you miss three or more appointments without notifying the office, you may be dismissed from the clinic at the provider's discretion.      For prescription refill requests, have your pharmacy contact our office and allow 72 hours for refills to be completed.    Today you received the following chemotherapy and/or immunotherapy agents: Vidaza      To help prevent nausea and vomiting after your treatment, we encourage you to take your nausea medication as directed.  BELOW ARE SYMPTOMS THAT SHOULD BE REPORTED IMMEDIATELY: *FEVER GREATER THAN 100.4 F (38 C) OR HIGHER *CHILLS OR SWEATING *NAUSEA AND VOMITING THAT IS NOT CONTROLLED WITH YOUR NAUSEA MEDICATION *UNUSUAL SHORTNESS OF BREATH *UNUSUAL BRUISING OR BLEEDING *URINARY PROBLEMS (pain or burning when urinating, or frequent urination) *BOWEL PROBLEMS (unusual diarrhea, constipation, pain near the anus) TENDERNESS IN MOUTH AND THROAT WITH OR WITHOUT PRESENCE OF ULCERS (sore throat, sores in mouth, or a toothache) UNUSUAL RASH, SWELLING OR PAIN  UNUSUAL VAGINAL DISCHARGE OR ITCHING   Items with * indicate a potential emergency and should be followed up as soon as possible or go to the Emergency Department if any problems should occur.  Please show the CHEMOTHERAPY ALERT CARD or IMMUNOTHERAPY ALERT CARD at check-in to the  Emergency Department and triage nurse.  Should you have questions after your visit or need to cancel or reschedule your appointment, please contact [facility]  Dept: [phone]  and follow the prompts.  Office hours are 8:00 a.m. to 4:30 p.m. Monday - Friday. Please note that voicemails left after 4:00 p.m. may not be returned until the following business day.  We are closed weekends and major holidays. You have access to a nurse at all times for urgent questions. Please call the main number to the clinic Dept: [phone] and follow the prompts.   For any non-urgent questions, you may also contact your provider using MyChart. We now offer e-Visits for anyone 19 and older to request care online for non-urgent symptoms. For details visit [email or web address].   Also download the MyChart app! Go to the app store, search "MyChart", open the app, select [facility], and log in with your MyChart username and password.  Masks are optional in the cancer centers. If you would like for your care team to wear a mask while they are taking care of you, please let them know. You may have one support person who is at least 76 years old accompany you for your appointments.

## 2022-09-22 NOTE — Progress Notes (Signed)
[name], [name] ([id number]) Visit Report for 09/20/2022 Allergy List Details Patient Name: Date of Service: [name], [name] 09/20/2022 10:30 A M Medical Record Number: [id number] Patient Account Number: [id number] Date of Birth/Sex: Treating RN: 06/27/1946 (76 y.o. [name] Primary Care [name]: [name] Other Clinician: Referring [name]: Treating [name]/Extender: [name] in Treatment: 0 Allergies Active Allergies penicillin Reaction: rash Severity: Moderate Plaquenil Reaction: rash Severity: Moderate alendronate sodium Reaction: extreme rash gluten Reaction: diarrhea digoxin Reaction: rash Severity: Moderate hydroxychloroquine Reaction: rash Severity: Moderate Allergy Notes Electronic Signature(s) Signed: 09/22/2022 4:38:59 PM By: Sherry East RN Entered By: [name] on 09/20/2022 10:58:57 -------------------------------------------------------------------------------- Arrival Information Details Patient Name: Date of Service: [name] [name] 09/20/2022 10:30 A M Medical Record Number: [id number] Patient Account Number: [id number] Date of Birth/Sex: Treating RN: November 13, 1946 (76 y.o. [name] Primary Care [name]: [name] Other Clinician: Referring [name]: Treating [name]/Extender: [name] in Treatment: 0 Visit Information Patient Arrived: Wheel Chair Arrival Time: 10:46 Accompanied By: self Transfer Assistance: EasyPivot Patient Lift Patient Identification Verified: Yes Secondary Verification Process Completed: Yes Patient Requires Transmission-Based Precautions: No Patient Has Alerts: No Electronic Signature(s) Signed: 09/22/2022 4:38:59 PM By: Sherry East RN Entered By: [name] on 09/20/2022 10:46:49 -------------------------------------------------------------------------------- Clinic Level of Care Assessment Details Patient Name:  Date of Service: [name], [name] 09/20/2022 10:30 A M Medical Record Number: [id number] Patient Account Number: [id number] Date of Birth/Sex: Treating RN: 1946/09/19 (76 y.o. [name] Primary Care Tyreshia Ingman: [name] Other Clinician: Referring [name]: Treating [name]/Extender: [name] in Treatment: 0 Clinic Level of Care Assessment Items TOOL 1 Quantity Score X- 1 0 Use when EandM and Procedure is performed on INITIAL visit ASSESSMENTS - Nursing Assessment / Reassessment X- 1 20 General Physical Exam (combine w/ comprehensive assessment (listed just below) when performed on new pt. evals) X- 1 25 Comprehensive Assessment (HX, ROS, Risk Assessments, Wounds Hx, etc.) ASSESSMENTS - Wound and Skin Assessment / Reassessment []  - 0 Dermatologic / Skin Assessment (not related to wound area) ASSESSMENTS - Ostomy and/or Continence Assessment and Care []  - 0 Incontinence Assessment and Management []  - 0 Ostomy Care Assessment and Management (repouching, etc.) PROCESS - Coordination of Care X - Simple Patient / Family Education for ongoing care 1 15 []  - 0 Complex (extensive) Patient / Family Education for ongoing care X- 1 10 Staff obtains Programmer, systems, Records, T Results / Process Orders est X- 1 10 Staff telephones HHA, Nursing Homes / Clarify orders / etc []  - 0 Routine Transfer to another Facility (non-emergent condition) []  - 0 Routine Hospital Admission (non-emergent condition) []  - 0 New Admissions / Biomedical engineer / Ordering NPWT Apligraf, etc. , []  - 0 Emergency Hospital Admission (emergent condition) PROCESS - Special Needs []  - 0 Pediatric / Minor Patient Management []  - 0 Isolation Patient Management []  - 0 Hearing / Language / Visual special needs []  - 0 Assessment of Community assistance (transportation, D/C planning, etc.) []  - 0 Additional assistance / Altered mentation []  - 0 Support Surface(s)  Assessment (bed, cushion, seat, etc.) INTERVENTIONS - Miscellaneous []  - 0 External ear exam []  - 0 Patient Transfer (multiple staff / Civil Service fast streamer / Similar devices) []  - 0 Simple Staple / Suture removal (25 or less) []  - 0 Complex Staple / Suture removal (26 or more) []  - 0 Hypo/Hyperglycemic Management (do not check if billed separately) X- 1 15 Ankle / Brachial  Index (ABI) - do not check if billed separately Has the patient been seen at the hospital within the last three years: Yes Total Score: 95 Level Of Care: New/Established - Level 3 Electronic Signature(s) Signed: 09/22/2022 4:38:59 PM By: Sherry East RN Entered By: [name] on 09/20/2022 13:49:36 -------------------------------------------------------------------------------- Encounter Discharge Information Details Patient Name: Date of Service: [name] [name] 09/20/2022 10:30 A M Medical Record Number: [id number] Patient Account Number: [id number] Date of Birth/Sex: Treating RN: 01/21/1946 (76 y.o. [name] Primary Care [name]: [name] Other Clinician: Referring [name]: Treating [name]/Extender: [name] in Treatment: 0 Encounter Discharge Information Items Post Procedure Vitals Discharge Condition: Stable Temperature (F): 97.9 Ambulatory Status: Wheelchair Pulse (bpm): 76 Discharge Destination: Home Respiratory Rate (breaths/min): 16 Transportation: Private Auto Blood Pressure (mmHg): 103/70 Accompanied By: self Schedule Follow-up Appointment: Yes Clinical Summary of Care: Electronic Signature(s) Signed: 09/22/2022 4:38:59 PM By: Sherry East RN Entered By: [name] on 09/20/2022 12:26:29 -------------------------------------------------------------------------------- Lower Extremity Assessment Details Patient Name: Date of Service: [name], [name] 09/20/2022 10:30 A M Medical Record Number: [id number] Patient Account Number:  [id number] Date of Birth/Sex: Treating RN: [birth year] (76 y.o. [name] Primary Care Ludie Pavlik: [name] Other Clinician: Referring [name]: Treating [name]/Extender: [name] in Treatment: 0 Edema Assessment Assessed: [Left: No] [Right: No] [Left: Edema] [Right: :] Calf Left: Right: Point of Measurement: From Medial Instep 34 cm Ankle Left: Right: Point of Measurement: From Medial Instep 24 cm Vascular Assessment Pulses: Dorsalis Pedis Palpable: [Right:Yes] Doppler Audible: [Right:Yes] Blood Pressure: Brachial: [Right:103] Ankle: [Right:Dorsalis Pedis: 160 1.55] Electronic Signature(s) Signed: 09/22/2022 4:38:59 PM By: Sherry East RN Entered By: [name] on 09/20/2022 11:17:59 -------------------------------------------------------------------------------- Multi Wound Chart Details Patient Name: Date of Service: [name] [name] 09/20/2022 10:30 A M Medical Record Number: [id number] Patient Account Number: [id number] Date of Birth/Sex: Treating RN: December 01, 1946 (76 y.o. F) Primary Care Amiaya Mcneeley: [name] Other Clinician: Referring Yamin Swingler: Treating Marielys Trinidad/Extender: [name] in Treatment: 0 Vital Signs Height(in): 6 Pulse(bpm): 18 Weight(lbs): 111 Blood Pressure(mmHg): 103/70 Body Mass Index(BMI): 19.1 Temperature(F): 97.9 Respiratory Rate(breaths/min): 16 Photos: [2:No Photos] [N/A:N/A] Right, Dorsal Foot Right, Lateral Lower Leg N/A Wound Location: Blister Skin Tear/Laceration N/A Wounding Event: Venous Leg Ulcer Abrasion N/A Primary Etiology: Asthma, Chronic Obstructive Asthma, Chronic Obstructive N/A Comorbid History: Pulmonary Disease (COPD), Pulmonary Disease (COPD), Congestive Heart Failure, Congestive Heart Failure, Hypertension, Rheumatoid Arthritis, Hypertension, Rheumatoid Arthritis, Neuropathy, Received Chemotherapy Neuropathy, Received  Chemotherapy 08/23/2022 09/20/2022 N/A Date Acquired: 0 0 N/A Weeks of Treatment: Open Open N/A Wound Status: No No N/A Wound Recurrence: 1.1x1.2x0.6 1.5x0.6x0.1 N/A Measurements L x W x D (cm) 1.037 0.707 N/A A (cm) : rea 0.622 0.071 N/A Volume (cm) : 12 Starting Position 1 (o'clock): 12 Ending Position 1 (o'clock): 3 Maximum Distance 1 (cm): Yes No N/A Undermining: Full Thickness Without Exposed Full Thickness Without Exposed N/A Classification: Support Structures Support Structures Medium Medium N/A Exudate Amount: Serosanguineous Serosanguineous N/A Exudate Type: red, brown red, brown N/A Exudate Color: Distinct, outline attached Distinct, outline attached N/A Wound Margin: Small (1-33%) Medium (34-66%) N/A Granulation Amount: Red Red N/A Granulation Quality: Large (67-100%) Medium (34-66%) N/A Necrotic Amount: Eschar, Adherent Slough Eschar, Adherent Slough N/A Necrotic Tissue: Fat Layer (Subcutaneous Tissue): Yes Fat Layer (Subcutaneous Tissue): Yes N/A Exposed Structures: Fascia: No Fascia: No Tendon: No Tendon: No Muscle: No Muscle: No Joint: No Joint: No Bone: No None None N/A Epithelialization: Debridement - Excisional  Debridement - Excisional N/A Debridement: Pre-procedure Verification/Time Out 11:33 11:33 N/A Taken: Lidocaine 4% Topical Solution Lidocaine 4% Topical Solution N/A Pain Control: Other, Subcutaneous Subcutaneous, Slough N/A Tissue Debrided: Skin/Subcutaneous Tissue Skin/Subcutaneous Tissue N/A Level: 1.32 0.9 N/A Debridement A (sq cm): rea Curette, Forceps, Scissors Curette N/A Instrument: Minimum Minimum N/A Bleeding: Pressure Pressure N/A Hemostasis A chieved: 0 0 N/A Procedural Pain: 0 0 N/A Post Procedural Pain: Procedure was tolerated well Procedure was tolerated well N/A Debridement Treatment Response: 1.1x1.2x0.6 1.5x0.6x0.1 N/A Post Debridement Measurements L x W x D (cm) 0.622 0.071 N/A Post  Debridement Volume: (cm) Debridement Debridement N/A Procedures Performed: Treatment Notes Electronic Signature(s) Signed: 09/20/2022 12:10:02 PM By: Fredirick Maudlin MD FACS Entered By: Fredirick Maudlin on 09/20/2022 12:10:02 -------------------------------------------------------------------------------- Multi-Disciplinary Care Plan Details Patient Name: Date of Service: [name] [name] 09/20/2022 10:30 A M Medical Record Number: [id number] Patient Account Number: [id number] Date of Birth/Sex: Treating RN: 12-Oct-1946 (76 y.o. [name] Primary Care Yoselyn Mcglade: [name] Other Clinician: Referring Kayla Weekes: Treating Jarmar Rousseau/Extender: [name] in Treatment: 0 Active Inactive Peripheral Neuropathy Nursing Diagnoses: Potential alteration in peripheral tissue perfusion (select prior to confirmation of diagnosis) Goals: Patient/caregiver will verbalize understanding of disease process and disease management Date Initiated: 09/20/2022 Target Resolution Date: 10/18/2022 Goal Status: Active Interventions: Provide education on Management of Neuropathy and Related Ulcers Treatment Activities: Patient referred for customized footwear/offloading : 09/20/2022 Notes: Wound/Skin Impairment Nursing Diagnoses: Knowledge deficit related to ulceration/compromised skin integrity Goals: Ulcer/skin breakdown will have a volume reduction of 30% by week 4 Date Initiated: 09/20/2022 Target Resolution Date: 10/18/2022 Goal Status: Active Interventions: Assess ulceration(s) every visit Treatment Activities: Skin care regimen initiated : 09/20/2022 Topical wound management initiated : 09/20/2022 Notes: Electronic Signature(s) Signed: 09/22/2022 4:38:59 PM By: Sherry East RN Entered By: [name] on 09/20/2022 11:23:05 -------------------------------------------------------------------------------- Pain Assessment Details Patient Name: Date of  Service: [name] [name] 09/20/2022 10:30 A M Medical Record Number: [id number] Patient Account Number: [id number] Date of Birth/Sex: Treating RN: Sep 05, 1946 (76 y.o. [name] Primary Care Kalmen Lollar: [name] Other Clinician: Referring Anayi Bricco: Treating Stayce Delancy/Extender: [name] in Treatment: 0 Active Problems Location of Pain Severity and Description of Pain Patient Has Paino No Site Locations Pain Management and Medication Current Pain Management: Electronic Signature(s) Signed: 09/22/2022 4:38:59 PM By: Sherry East RN Entered By: [name] on 09/20/2022 11:20:18 -------------------------------------------------------------------------------- Patient/Caregiver Education Details Patient Name: Date of Service: [name] 9/27/2023andnbsp10:30 A M Medical Record Number: [id number] Patient Account Number: [id number] Date of Birth/Gender: Treating RN: 06-May-1946 (76 y.o. [name] Primary Care Physician: [name] Other Clinician: Referring Physician: Treating Physician/Extender: [name] in Treatment: 0 Education Assessment Education Provided To: Patient Education Topics Provided Peripheral Neuropathy: Handouts: Neuropathy Methods: Explain/Verbal Responses: Reinforcements needed, State content correctly [place]: o Handouts: Welcome T [facility] o Methods: Explain/Verbal Responses: Reinforcements needed, State content correctly Wound/Skin Impairment: Handouts: Caring for Your Ulcer Methods: Explain/Verbal Responses: Reinforcements needed, State content correctly Electronic Signature(s) Signed: 09/22/2022 4:38:59 PM By: Sherry East RN Entered By: [name] on 09/20/2022 11:24:00 -------------------------------------------------------------------------------- Wound Assessment Details Patient Name: Date of  Service: [name] [name] 09/20/2022 10:30 A M Medical Record Number: [id number] Patient Account Number: [id number] Date of Birth/Sex: Treating RN: 05-20-1946 (76 y.o. [name] Primary Care Cornelius Marullo: [name] Other Clinician: Referring Theresa Wedel: Treating Deandre Brannan/Extender: [name] in Treatment: 0  Wound Status Wound Number: 1 Primary Venous Leg Ulcer Etiology: Wound Location: Right, Dorsal Foot Wound Open Wounding Event: Blister Status: Date Acquired: 08/23/2022 Comorbid Asthma, Chronic Obstructive Pulmonary Disease (COPD), Weeks Of Treatment: 0 History: Congestive Heart Failure, Hypertension, Rheumatoid Arthritis, Clustered Wound: No Neuropathy, Received Chemotherapy Photos Wound Measurements Length: (cm) 1.1 Width: (cm) 1.2 Depth: (cm) 0.6 Area: (cm) 1.037 Volume: (cm) 0.622 % Reduction in Area: % Reduction in Volume: Epithelialization: None Tunneling: No Undermining: Yes Starting Position (o'clock): 12 Ending Position (o'clock): 12 Maximum Distance: (cm) 3 Wound Description Classification: Full Thickness Without Exposed Support Structures Wound Margin: Distinct, outline attached Exudate Amount: Medium Exudate Type: Serosanguineous Exudate Color: red, brown Foul Odor After Cleansing: No Slough/Fibrino Yes Wound Bed Granulation Amount: Small (1-33%) Exposed Structure Granulation Quality: Red Fascia Exposed: No Necrotic Amount: Large (67-100%) Fat Layer (Subcutaneous Tissue) Exposed: Yes Necrotic Quality: Eschar, Adherent Slough Tendon Exposed: No Muscle Exposed: No Joint Exposed: No Electronic Signature(s) Signed: 09/22/2022 4:38:59 PM By: Sherry East RN Entered By: [name] on 09/20/2022 11:52:01 -------------------------------------------------------------------------------- Wound Assessment Details Patient Name: Date of Service: [name] [name] 09/20/2022 10:30 A M Medical Record Number:  [id number] Patient Account Number: [id number] Date of Birth/Sex: Treating RN: Mar 16, 1946 (76 y.o. [name], [name] Primary Care Alvera Tourigny: [name] Other Clinician: Referring Shikha Bibb: Treating Anastasia Tompson/Extender: [name] in Treatment: 0 Wound Status Wound Number: 2 Primary Abrasion Etiology: Wound Location: Right, Lateral Lower Leg Wound Open Wounding Event: Skin Tear/Laceration Status: Date Acquired: 09/20/2022 Comorbid Asthma, Chronic Obstructive Pulmonary Disease (COPD), Weeks Of Treatment: 0 History: Congestive Heart Failure, Hypertension, Rheumatoid Arthritis, Clustered Wound: No Neuropathy, Received Chemotherapy Wound Measurements Length: (cm) 1.5 Width: (cm) 0.6 Depth: (cm) 0.1 Area: (cm) 0.707 Volume: (cm) 0.071 % Reduction in Area: % Reduction in Volume: Epithelialization: None Tunneling: No Undermining: No Wound Description Classification: Full Thickness Without Exposed Support Structures Wound Margin: Distinct, outline attached Exudate Amount: Medium Exudate Type: Serosanguineous Exudate Color: red, brown Foul Odor After Cleansing: No Slough/Fibrino Yes Wound Bed Granulation Amount: Medium (34-66%) Exposed Structure Granulation Quality: Red Fascia Exposed: No Necrotic Amount: Medium (34-66%) Fat Layer (Subcutaneous Tissue) Exposed: Yes Necrotic Quality: Eschar, Adherent Slough Tendon Exposed: No Muscle Exposed: No Joint Exposed: No Bone Exposed: No Electronic Signature(s) Signed: 09/22/2022 4:38:59 PM By: Sherry East RN Entered By: [name] on 09/20/2022 11:50:22 -------------------------------------------------------------------------------- Walker Details Patient Name: Date of Service: [name] [name] 09/20/2022 10:30 A M Medical Record Number: [id number] Patient Account Number: [id number] Date of Birth/Sex: Treating RN: 1946/07/10 (76 y.o. [name], [name] Primary Care Letasha Kershaw: [name] Other Clinician: Referring Jennise Both: Treating Britainy Kozub/Extender: [name] in Treatment: 0 Vital Signs Time Taken: 10:46 Temperature (F): 97.9 Height (in): 64 Pulse (bpm): 76 Source: Stated Respiratory Rate (breaths/min): 16 Weight (lbs): 111 Blood Pressure (mmHg): 103/70 Source: Stated Reference Range: 80 - 120 mg / dl Body Mass Index (BMI): 19.1 Electronic Signature(s) Signed: 09/22/2022 4:38:59 PM By: Sherry East RN Entered By: [name] on 09/20/2022 10:55:52

## 2022-09-22 NOTE — Progress Notes (Signed)
[name] ([id number]) Visit Report for 09/20/2022 Chief Complaint Document Details Patient Name: Date of Service: [name], [name] 09/20/2022 10:30 A M Medical Record Number: [id number] Patient Account Number: [id number] Date of Birth/Sex: Treating RN: [birth year] (76 y.o. F) Primary Care Provider: [name] Other Clinician: Referring Provider: Treating Provider/Extender: [name] in Treatment: 0 Information Obtained from: Patient Chief Complaint Patient seen for complaints of Non-Healing Wounds. Electronic Signature(s) Signed: 09/20/2022 12:11:16 PM By: Fredirick Maudlin MD FACS Entered By: Fredirick Maudlin on 09/20/2022 12:11:16 -------------------------------------------------------------------------------- Debridement Details Patient Name: Date of Service: Sherry Masson [name] 09/20/2022 10:30 A M Medical Record Number: [id number] Patient Account Number: [id number] Date of Birth/Sex: Treating RN: 06-18-1946 (76 y.o. Iver [name], Sherry Primary Care Provider: [name] Other Clinician: Referring Provider: Treating Provider/Extender: [name] in Treatment: 0 Debridement Performed for Assessment: Wound #1 Right,Dorsal Foot Performed By: Physician Fredirick Maudlin, MD Debridement Type: Debridement Severity of Tissue Pre Debridement: Fat layer exposed Level of Consciousness (Pre-procedure): Awake and Alert Pre-procedure Verification/Time Out Yes - 11:33 Taken: Start Time: 11:34 Pain Control: Lidocaine 4% T opical Solution T Area Debrided (L x W): otal 1.1 (cm) x 1.2 (cm) = 1.32 (cm) Tissue and other material debrided: Viable, Non-Viable, Subcutaneous, Other: hematoma Level: Skin/Subcutaneous Tissue Debridement Description: Excisional Instrument: Curette, Forceps, Scissors Bleeding: Minimum Hemostasis Achieved: Pressure Procedural Pain: 0 Post Procedural Pain: 0 Response to Treatment: Procedure  was tolerated well Level of Consciousness (Post- Awake and Alert procedure): Post Debridement Measurements of Total Wound Length: (cm) 1.1 Width: (cm) 1.2 Depth: (cm) 0.6 Volume: (cm) 0.622 Character of Wound/Ulcer Post Debridement: Requires Further Debridement Severity of Tissue Post Debridement: Fat layer exposed Post Procedure Diagnosis Same as Pre-procedure Notes Scribed for Dr. [name] by Blanche East, RN Electronic Signature(s) Signed: 09/20/2022 12:22:25 PM By: Fredirick Maudlin MD FACS Signed: 09/22/2022 4:38:59 PM By: Blanche East RN Entered By: Blanche East on 09/20/2022 11:45:19 -------------------------------------------------------------------------------- Debridement Details Patient Name: Date of Service: Sherry Masson [name] 09/20/2022 10:30 A M Medical Record Number: [id number] Patient Account Number: [id number] Date of Birth/Sex: Treating RN: 13-Sep-1946 (76 y.o. Iver [name], Sherry Primary Care Provider: [name] Other Clinician: Referring Provider: Treating Provider/Extender: [name] in Treatment: 0 Debridement Performed for Assessment: Wound #2 Right,Lateral Lower Leg Performed By: Physician Fredirick Maudlin, MD Debridement Type: Debridement Level of Consciousness (Pre-procedure): Awake and Alert Pre-procedure Verification/Time Out Yes - 11:33 Taken: Start Time: 11:34 Pain Control: Lidocaine 4% T opical Solution T Area Debrided (L x W): otal 1.5 (cm) x 0.6 (cm) = 0.9 (cm) Tissue and other material debrided: Viable, Non-Viable, Slough, Subcutaneous, Slough Level: Skin/Subcutaneous Tissue Debridement Description: Excisional Instrument: Curette Bleeding: Minimum Hemostasis Achieved: Pressure Procedural Pain: 0 Post Procedural Pain: 0 Response to Treatment: Procedure was tolerated well Level of Consciousness (Post- Awake and Alert procedure): Post Debridement Measurements of Total Wound Length: (cm) 1.5 Width:  (cm) 0.6 Depth: (cm) 0.1 Volume: (cm) 0.071 Character of Wound/Ulcer Post Debridement: Improved Post Procedure Diagnosis Same as Pre-procedure Notes scribed for Dr. [name] by Blanche East, RN Electronic Signature(s) Signed: 09/20/2022 1:02:06 PM By: Fredirick Maudlin MD FACS Signed: 09/22/2022 4:38:59 PM By: Blanche East RN Previous Signature: 09/20/2022 12:22:25 PM Version By: Fredirick Maudlin MD FACS Entered By: Blanche East on 09/20/2022 12:25:38 -------------------------------------------------------------------------------- HPI Details Patient Name: Date of Service: Sherry Masson [name] 09/20/2022 10:30 A M Medical Record Number: [id number] Patient Account Number: [id number] Date of Birth/Sex: Treating RN: Jun 16, 1946 (  76 y.o. F) Primary Care Provider: [name] Other Clinician: Referring Provider: Treating Provider/Extender: [name] in Treatment: 0 History of Present Illness HPI Description: ADMISSION 09/20/2022 This is a 76 year old woman with a past medical history significant for congestive heart failure, atrial fibrillation, rheumatoid arthritis on long-term steroid treatment, CMML currently receiving chemotherapy and moderate malnutrition. She presents to clinic today with a wound on the dorsum of her right foot. She says it started out as a bruise and then developed a bump which subsequently broke open. She says she was sent to our clinic by her oncologist. She is not diabetic and does not smoke. ABI in clinic today was 1.55. She has had formal segmental arterial Dopplers done, a little over a year ago, which did not show any evidence of occlusive vascular disease. 09/20/2022: On the dorsal aspect of the right foot, there is a wound with a lot of old hematoma, slough, eschar, and and nonviable subcutaneous tissue. T the o best of my ability to discern, it does appear to involve the muscle layer, but there is no evidence of necrosis. The  wound is pouring serous fluid; the patient has 3+ pitting edema to the bilateral lower extremities. Electronic Signature(s) Signed: 09/20/2022 12:15:55 PM By: Fredirick Maudlin MD FACS Entered By: Fredirick Maudlin on 09/20/2022 12:15:55 -------------------------------------------------------------------------------- Physical Exam Details Patient Name: Date of Service: Sherry Masson [name] 09/20/2022 10:30 A M Medical Record Number: [id number] Patient Account Number: [id number] Date of Birth/Sex: Treating RN: May 30, 1946 (76 y.o. F) Primary Care Provider: [name] Other Clinician: Referring Provider: Treating Provider/Extender: [name] in Treatment: 0 Constitutional . . . . No acute distress; facial bruising secondary to recent fall.Marland Kitchen Respiratory Normal work of breathing on room air.. Cardiovascular 3+ pitting edema from her toes to at least above the knee.. Notes 09/20/2022: On the dorsal aspect of the right foot, there is a wound with a lot of old hematoma, slough, eschar, and and nonviable subcutaneous tissue. T the o best of my ability to discern, it does appear to involve the muscle layer, but there is no evidence of necrosis. The wound is pouring serous fluid. Electronic Signature(s) Signed: 09/20/2022 12:18:18 PM By: Fredirick Maudlin MD FACS Entered By: Fredirick Maudlin on 09/20/2022 12:18:18 -------------------------------------------------------------------------------- Physician Orders Details Patient Name: Date of Service: Sherry Masson [name] 09/20/2022 10:30 A M Medical Record Number: [id number] Patient Account Number: [id number] Date of Birth/Sex: Treating RN: 03/01/1946 (76 y.o. Marta Lamas Primary Care Provider: [name] Other Clinician: Referring Provider: Treating Provider/Extender: [name] in Treatment: 0 Verbal / Phone Orders: No Diagnosis Coding Follow-up Appointments ppointment  in 1 week. - Dr. [name] Rm 4 Return A Friday 09/29/22 at 3:15 Nurse Visit: - friday Bathing/ Shower/ Hygiene May shower with protection but do not get wound dressing(s) wet. Edema Control - Lymphedema / SCD / Other Left Lower Extremity Elevate legs to the level of the heart or above for 30 minutes daily and/or when sitting, a frequency of: Avoid standing for long periods of time. Patient to wear own compression stockings every day. Compression stocking or Garment 20-30 mm/Hg pressure to: - left leg Home Health Admit to Home Health for wound care. May utilize formulary equivalent dressing for wound treatment orders unless otherwise specified. New wound care orders this week; continue Home Health for wound care. May utilize formulary equivalent dressing for wound treatment orders unless otherwise specified. Wound Treatment Wound #1 - Foot Wound Laterality:  Dorsal, Right Cleanser: Soap and Water 1 x Per Day/30 Days Discharge Instructions: May shower and wash wound with dial antibacterial soap and water prior to dressing change. Cleanser: Wound Cleanser 1 x Per Day/30 Days Discharge Instructions: Cleanse the wound with wound cleanser prior to applying a clean dressing using gauze sponges, not tissue or cotton balls. Peri-Wound Care: Sween Lotion (Moisturizing lotion) 1 x Per Day/30 Days Discharge Instructions: Apply moisturizing lotion as directed Prim Dressing: Iodoform packing strip 1/2 (in) (DME) (Dispense As Written) 1 x Per Day/30 Days ary Discharge Instructions: Lightly pack as instructed Secondary Dressing: ABD Pad, 5x9 (DME) (Generic) 1 x Per Day/30 Days Discharge Instructions: Apply over primary dressing as directed. Secondary Dressing: Zetuvit Plus 4x8 in (DME) (Generic) 1 x Per Day/30 Days Discharge Instructions: Apply over primary dressing as directed. Secured With: Transpore Surgical Tape, 2x10 (in/yd) (Generic) 1 x Per Day/30 Days Discharge Instructions: Secure dressing with  tape as directed. Compression Wrap: Kerlix Roll 4.5x3.1 (in/yd) (DME) (Generic) 1 x Per Day/30 Days Discharge Instructions: Apply Kerlix and Coban compression as directed. Compression Wrap: Coban Self-Adherent Wrap 4x5 (in/yd) (DME) (Generic) 1 x Per Day/30 Days Discharge Instructions: Apply over Kerlix as directed. Wound #2 - Lower Leg Wound Laterality: Right, Lateral Cleanser: Soap and Water 1 x Per Day/30 Days Discharge Instructions: May shower and wash wound with dial antibacterial soap and water prior to dressing change. Cleanser: Wound Cleanser 1 x Per Day/30 Days Discharge Instructions: Cleanse the wound with wound cleanser prior to applying a clean dressing using gauze sponges, not tissue or cotton balls. Peri-Wound Care: Sween Lotion (Moisturizing lotion) 1 x Per Day/30 Days Discharge Instructions: Apply moisturizing lotion as directed Prim Dressing: KerraCel Ag Gelling Fiber Dressing, 2x2 in (silver alginate) (DME) (Generic) 1 x Per Day/30 Days ary Discharge Instructions: Apply silver alginate to wound bed as instructed Secondary Dressing: ABD Pad, 5x9 (DME) (Generic) 1 x Per Day/30 Days Discharge Instructions: Apply over primary dressing as directed. Secondary Dressing: Woven Gauze Sponge, Non-Sterile 4x4 in (DME) (Generic) 1 x Per Day/30 Days Discharge Instructions: Apply over primary dressing as directed. Secured With: Transpore Surgical Tape, 2x10 (in/yd) 1 x Per Day/30 Days Discharge Instructions: Secure dressing with tape as directed. Compression Wrap: Kerlix Roll 4.5x3.1 (in/yd) (Generic) 1 x Per Day/30 Days Discharge Instructions: Apply Kerlix and Coban compression as directed. Compression Wrap: Coban Self-Adherent Wrap 4x5 (in/yd) 1 x Per Day/30 Days Discharge Instructions: Apply over Kerlix as directed. Patient Medications llergies: penicillin, Plaquenil, digoxin, hydroxychloroquine, alendronate sodium, gluten A Notifications Medication Indication Start End prior to  debridement 09/20/2022 lidocaine DOSE topical 4 % cream - cream topical Electronic Signature(s) Signed: 09/22/2022 12:47:41 PM By: Fredirick Maudlin MD FACS Signed: 09/22/2022 4:38:59 PM By: Blanche East RN Previous Signature: 09/20/2022 1:25:47 PM Version By: Fredirick Maudlin MD FACS Previous Signature: 09/20/2022 12:22:25 PM Version By: Fredirick Maudlin MD FACS Entered By: Blanche East on 09/22/2022 12:33:23 -------------------------------------------------------------------------------- Problem List Details Patient Name: Date of Service: Sherry Masson [name] 09/20/2022 10:30 A M Medical Record Number: [id number] Patient Account Number: [id number] Date of Birth/Sex: Treating RN: Apr 05, 1946 (76 y.o. 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 09/20/2022 No Yes without evidence of necrosis L97.812 Non-pressure chronic ulcer of other part of right lower leg with fat layer 09/20/2022 No Yes exposed  Y65.03 Chronic systolic (congestive) heart failure 09/20/2022 No Yes M06.9 Rheumatoid arthritis, unspecified 09/20/2022 No Yes Z79.52 Long term (current) use of systemic steroids 09/20/2022 No Yes I70.90 Unspecified atherosclerosis 09/20/2022 No Yes 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 09/20/2022 10:30 A M Medical Record Number: [id number] Patient Account Number: [id number] Date of Birth/Sex: Treating RN: 1946/04/07 (76 y.o. F) Primary Care Provider: [name] Other Clinician: Referring Provider: Treating Provider/Extender: [name] in Treatment: 0 Subjective Chief Complaint Information obtained from Patient Patient seen for complaints of Non-Healing Wounds. History of Present Illness (HPI) ADMISSION 09/20/2022 This is a 76 year old woman with a past medical history significant for congestive heart failure, atrial fibrillation, rheumatoid arthritis on long-term steroid treatment, CMML currently receiving chemotherapy and moderate malnutrition. She presents to clinic today with a wound on the dorsum of her right foot. She says it started out as a bruise and then developed a bump which subsequently broke open. She says she was sent to our clinic by her oncologist. She is not diabetic and does not smoke. ABI in clinic today was 1.55. She has had formal segmental arterial Dopplers done, a little over a year ago, which did not show any evidence of occlusive vascular disease. 09/20/2022: On the dorsal aspect of the right foot, there is a wound with a lot of old hematoma, slough, eschar, and and nonviable subcutaneous tissue. T the o best of my ability to discern, it does appear to involve the muscle layer, but there is no evidence of necrosis. The wound is pouring serous fluid; the patient has 3+ pitting edema to the bilateral lower extremities. Patient History Information obtained from Patient. Allergies penicillin (Severity: Moderate, Reaction: rash), Plaquenil (Severity: Moderate, Reaction: rash), alendronate sodium (Reaction: extreme rash), gluten (Reaction: diarrhea),  digoxin (Severity: Moderate, Reaction: rash), hydroxychloroquine (Severity: Moderate, Reaction: rash) Family History Unknown History, Diabetes - Father, Heart Disease - Father, No family history of Cancer. Social History Former smoker - quit 2003, Marital Status - Married, Alcohol Use - Daily, Drug Use - Prior History, Caffeine Use - Daily. Medical History Eyes Denies history of Cataracts, Glaucoma, Optic Neuritis Ear/Nose/Mouth/Throat Denies history of Chronic sinus problems/congestion, Middle ear problems Respiratory Patient has history of Asthma, Chronic Obstructive Pulmonary Disease (COPD) Cardiovascular Patient has history of Congestive Heart Failure, Hypertension Denies history of Coronary Artery Disease, Deep Vein Thrombosis, Hypotension, Myocardial Infarction Endocrine Denies history of Type I Diabetes, Type II Diabetes Immunological Denies history of Lupus Erythematosus, Raynaudoos, Scleroderma Integumentary (Skin) Denies history of History of Burn Musculoskeletal Patient has history of Rheumatoid Arthritis Denies history of Gout Neurologic Patient has history of Neuropathy Oncologic Patient has history of Received Chemotherapy - currently receiving Hospitalization/Surgery History - reverse shoulder arthroplasty Rt- 2018. Medical A Surgical History Notes nd Cardiovascular AFIB Genitourinary Stage 3 CKD Review of Systems (ROS) Constitutional Symptoms (General Health) Denies complaints or symptoms of Fatigue, Fever, Chills, Marked Weight Change. Eyes Denies complaints or symptoms of Dry Eyes, Glasses / Contacts. Respiratory Denies complaints or symptoms of Chronic or frequent coughs, Shortness of Breath. Cardiovascular Denies complaints or symptoms of Chest pain. Gastrointestinal Denies complaints or symptoms of  Frequent diarrhea, Nausea, Vomiting. Endocrine Denies complaints or symptoms of Heat/cold intolerance. Integumentary (Skin) Rt dorsal  ft Oncologic leukemia Psychiatric Denies complaints or symptoms of Claustrophobia. Objective Constitutional No acute distress; facial bruising secondary to recent fall.. Vitals Time Taken: 10:46 AM, Height: 64 in, Source: Stated, Weight: 111 lbs, Source: Stated, BMI: 19.1, Temperature: 97.9 F, Pulse: 76 bpm, Respiratory Rate: 16 breaths/min, Blood Pressure: 103/70 mmHg. Respiratory Normal work of breathing on room air.. Cardiovascular 3+ pitting edema from her toes to at least above the knee.. General Notes: 09/20/2022: On the dorsal aspect of the right foot, there is a wound with a lot of old hematoma, slough, eschar, and and nonviable subcutaneous tissue. T the best of my ability to discern, it does appear to involve the muscle layer, but there is no evidence of necrosis. The wound is pouring serous fluid. o Integumentary (Hair, Skin) Wound #1 status is Open. Original cause of wound was Blister. The date acquired was: 08/23/2022. The wound is located on the Right,Dorsal Foot. The wound measures 1.1cm length x 1.2cm width x 0.6cm depth; 1.037cm^2 area and 0.622cm^3 volume. There is Fat Layer (Subcutaneous Tissue) exposed. There is no tunneling noted, however, there is undermining starting at 12:00 and ending at 12:00 with a maximum distance of 3cm. There is a medium amount of serosanguineous drainage noted. The wound margin is distinct with the outline attached to the wound base. There is small (1-33%) red granulation within the wound bed. There is a large (67-100%) amount of necrotic tissue within the wound bed including Eschar and Adherent Slough. Wound #2 status is Open. Original cause of wound was Skin T ear/Laceration. The date acquired was: 09/20/2022. The wound is located on the Right,Lateral Lower Leg. The wound measures 1.5cm length x 0.6cm width x 0.1cm depth; 0.707cm^2 area and 0.071cm^3 volume. There is Fat Layer (Subcutaneous Tissue) exposed. There is no tunneling or undermining  noted. There is a medium amount of serosanguineous drainage noted. The wound margin is distinct with the outline attached to the wound base. There is medium (34-66%) red granulation within the wound bed. There is a medium (34-66%) amount of necrotic tissue within the wound bed including Eschar and Adherent Slough. 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 There was a Excisional Skin/Subcutaneous Tissue Debridement with a total area of 1.32 sq cm performed by Fredirick Maudlin, MD. With the following instrument(s): Curette, Forceps, and Scissors to remove Viable and Non-Viable tissue/material. Material removed includes Subcutaneous Tissue and Other: hematoma after achieving pain control using Lidocaine 4% Topical Solution. No specimens were taken. A time out was conducted at 11:33, prior to the start of the procedure. A Minimum amount of bleeding was controlled with Pressure. The procedure was tolerated well with a pain level of 0 throughout and a pain level of 0 following the procedure. Post Debridement Measurements: 1.1cm length x 1.2cm width x 0.6cm depth; 0.622cm^3 volume. Character of Wound/Ulcer Post Debridement requires further debridement. Severity of Tissue Post Debridement is: Fat layer exposed. Post procedure Diagnosis Wound #1: Same as Pre-Procedure  General Notes: Scribed for Dr. [name] by Blanche East, RN. Wound  #2 Pre-procedure diagnosis of Wound #2 is an Abrasion located on the Right,Lateral Lower Leg . There was a Excisional Skin/Subcutaneous Tissue Debridement with a total area of 0.9 sq cm performed by Fredirick Maudlin, MD. With the following instrument(s): Curette to remove Viable and Non-Viable tissue/material. Material removed includes Subcutaneous Tissue, Slough, and Other: hematoma after achieving pain control using Lidocaine 4% T opical Solution. No specimens were taken. A time out was conducted at 11:33, prior to the start of the procedure. A Minimum amount of bleeding was controlled with Pressure. The procedure was tolerated well with a pain level of 0 throughout and a pain level of 0 following the procedure. Post Debridement Measurements: 1.5cm length x 0.6cm width x 0.1cm depth; 0.071cm^3 volume. Character of Wound/Ulcer Post Debridement is improved. Post procedure Diagnosis Wound #2: Same as Pre-Procedure General Notes: scribed for Dr. [name] by Violeta Gelinas, RN. Plan Follow-up Appointments: Return Appointment in 1 week. - Dr. Alvera Singh 4 Nurse Visit: - Friday Anesthetic: Wound #1 Right,Dorsal Foot: (In clinic) Topical Lidocaine 4% applied to wound bed - in clinic, prior to debridement Bathing/ Shower/ Hygiene: May shower with protection but do not get wound dressing(s) wet. Edema Control - Lymphedema / SCD / Other: Elevate legs to the level of the heart or above for 30 minutes daily and/or when sitting, a frequency of: Avoid standing for long periods of time. Patient to wear own compression stockings every day. Compression stocking or Garment 20-30 mm/Hg pressure to: - left leg Home Health: Admit to [facility] for wound care. May utilize formulary equivalent dressing for wound treatment orders unless otherwise specified. New wound care orders this week; continue Home Health for wound care. May utilize formulary equivalent dressing for wound treatment orders unless otherwise  specified. The following medication(s) was prescribed: lidocaine topical 4 % cream cream topical for prior to debridement was prescribed at facility WOUND #1: - Foot Wound Laterality: Dorsal, Right Cleanser: Soap and Water 1 x Per Day/30 Days Discharge Instructions: May shower and wash wound with dial antibacterial soap and water prior to dressing change. Cleanser: Wound Cleanser 1 x Per Day/30 Days Discharge Instructions: Cleanse the wound with wound cleanser prior to applying a clean dressing using gauze sponges, not tissue or cotton balls. Peri-Wound Care: Sween Lotion (Moisturizing lotion) 1 x Per Day/30 Days Discharge Instructions: Apply moisturizing lotion as directed Prim Dressing: Iodoform packing strip 1/2 (in) 1 x Per Day/30 Days ary Discharge Instructions: Lightly pack as instructed Secondary Dressing: ABD Pad, 5x9 1 x Per Day/30 Days Discharge Instructions: Apply over primary dressing as directed. Secondary Dressing: Zetuvit Plus 4x8 in 1 x Per Day/30 Days Discharge Instructions: Apply over primary dressing as directed. Secured With: Transpore Surgical T ape, 2x10 (in/yd) 1 x Per Day/30 Days Discharge Instructions: Secure dressing with tape as directed. Com pression Wrap: Kerlix Roll 4.5x3.1 (in/yd) 1 x Per Day/30 Days Discharge Instructions: Apply Kerlix and Coban compression as directed. Com pression Wrap: Coban Self-Adherent Wrap 4x5 (in/yd) 1 x Per Day/30 Days Discharge Instructions: Apply over Kerlix as directed. WOUND #2: - Lower Leg Wound Laterality: Right, Lateral Cleanser: Soap and Water 1 x Per Day/30 Days Discharge Instructions: May shower and wash wound with dial antibacterial soap and water prior to dressing change. Cleanser: Wound Cleanser 1 x Per Day/30 Days Discharge Instructions: Cleanse the wound with wound cleanser prior to applying a clean dressing using gauze sponges, not tissue or cotton balls.  Peri-Wound Care: Sween Lotion (Moisturizing lotion) 1 x Per  Day/30 Days Discharge Instructions: Apply moisturizing lotion as directed Prim Dressing: KerraCel Ag Gelling Fiber Dressing, 2x2 in (silver alginate) 1 x Per Day/30 Days ary Discharge Instructions: Apply silver alginate to wound bed as instructed Secondary Dressing: ABD Pad, 5x9 1 x Per Day/30 Days Discharge Instructions: Apply over primary dressing as directed. Secondary Dressing: Woven Gauze Sponge, Non-Sterile 4x4 in 1 x Per Day/30 Days Discharge Instructions: Apply over primary dressing as directed. Secured With: Transpore Surgical T ape, 2x10 (in/yd) 1 x Per Day/30 Days Discharge Instructions: Secure dressing with tape as directed. Com pression Wrap: Kerlix Roll 4.5x3.1 (in/yd) 1 x Per Day/30 Days Discharge Instructions: Apply Kerlix and Coban compression as directed. Com pression Wrap: Coban Self-Adherent Wrap 4x5 (in/yd) 1 x Per Day/30 Days Discharge Instructions: Apply over Kerlix as directed. 09/20/2022: On the dorsal aspect of the right foot, there is a wound with a lot of old hematoma, slough, eschar, and and nonviable subcutaneous tissue. T the o best of my ability to discern, it does appear to involve the muscle layer, but there is no evidence of necrosis. The wound is pouring serous fluid. I used a curette to debride slough, eschar, old hematoma, and nonviable subcutaneous tissue from the wound on her right foot. It continued to pour serous fluid throughout the procedure. She also had some eschar on her lower leg that I removed with a curette which revealed an open wound. We will apply silver alginate to the leg and pack the dorsal foot wound with Iodoform packing strips. We will use Zetuvit to try and help control the degree of drainage. I would like to have her come back on Friday for a nurse visit due to the copious drainage. I encouraged her to contact her cardiologist, as well. She is only taking 20 mg of Lasix daily and she is very clearly quite fluid overloaded. Electronic  Signature(s) Signed: 09/20/2022 12:21:26 PM By: Fredirick Maudlin MD FACS Entered By: Fredirick Maudlin on 09/20/2022 12:21:26 -------------------------------------------------------------------------------- HxROS Details Patient Name: Date of Service: Sherry Masson [name] 09/20/2022 10:30 A M Medical Record Number: [id number] Patient Account Number: [id number] Date of Birth/Sex: Treating RN: [birth year] (76 y.o. Marta Lamas Primary Care Provider: [name] Other Clinician: Referring Provider: Treating Provider/Extender: [name] in Treatment: 0 Information Obtained From Patient Constitutional Symptoms (General Health) Complaints and Symptoms: Negative for: Fatigue; Fever; Chills; Marked Weight Change Eyes Complaints and Symptoms: Negative for: Dry Eyes; Glasses / Contacts Medical History: Negative for: Cataracts; Glaucoma; Optic Neuritis Respiratory Complaints and Symptoms: Negative for: Chronic or frequent coughs; Shortness of Breath Medical History: Positive for: Asthma; Chronic Obstructive Pulmonary Disease (COPD) Cardiovascular Complaints and Symptoms: Negative for: Chest pain Medical History: Positive for: Congestive Heart Failure; Hypertension Negative for: Coronary Artery Disease; Deep Vein Thrombosis; Hypotension; Myocardial Infarction Past Medical History Notes: AFIB Gastrointestinal Complaints and Symptoms: Negative for: Frequent diarrhea; Nausea; Vomiting Endocrine Complaints and Symptoms: Negative for: Heat/cold intolerance Medical History: Negative for: Type I Diabetes; Type II Diabetes Psychiatric Complaints and Symptoms: Negative for: Claustrophobia Ear/Nose/Mouth/Throat Medical History: Negative for: Chronic sinus problems/congestion; Middle ear problems Genitourinary Medical History: Past Medical History Notes: Stage 3 CKD Immunological Medical History: Negative for: Lupus Erythematosus; Raynauds;  Scleroderma Integumentary (Skin) Complaints and Symptoms: Review of System Notes: Rt dorsal ft Medical History: Negative for: History of Burn Musculoskeletal Medical History: Positive for: Rheumatoid Arthritis Negative for: Gout Neurologic Medical History: Positive for: Neuropathy Oncologic Complaints and  Symptoms: Review of System Notes: leukemia Medical History: Positive for: Received Chemotherapy - currently receiving Immunizations Pneumococcal Vaccine: Received Pneumococcal Vaccination: Yes Received Pneumococcal Vaccination On or After 60th Birthday: Yes Implantable Devices None Hospitalization / Surgery History Type of Hospitalization/Surgery reverse shoulder arthroplasty Rt- 2018 Family and Social History Unknown History: Yes; Cancer: No; Diabetes: Yes - Father; Heart Disease: Yes - Father; Former smoker - quit 2003; Marital Status - Married; Alcohol Use: Daily; Drug Use: Prior History; Caffeine Use: Daily; Financial Concerns: No; Food, Clothing or Shelter Needs: No; Support System Lacking: No; Transportation Concerns: No Physician Affirmation I have reviewed and agree with the above information. Electronic Signature(s) Signed: 09/20/2022 12:22:25 PM By: Fredirick Maudlin MD FACS Signed: 09/22/2022 4:38:59 PM By: Blanche East RN Entered By: Blanche East on 09/20/2022 11:26:22 -------------------------------------------------------------------------------- SuperBill Details Patient Name: Date of Service: CAMILE, ESTERS RO Ovidio Hanger 09/20/2022 Medical Record Number: [id number] Patient Account Number: [id number] Date of Birth/Sex: Treating RN: September 22, 1946 (76 y.o. 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 Facility Procedures CPT4 Code: 14431540 Description: [place] VISIT-LEV 3 EST PT Modifier: 25 Quantity: 1 CPT4 Code: 08676195 Description: [facility] - DEB SUBQ TISSUE 20 SQ CM/< ICD-10 Diagnosis Description 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 w L97.812 Non-pressure chronic ulcer of other part of right lower leg with fat layer expos Modifier: ithout evidence of n ed Quantity: 1 ecrosis Physician Procedures : CPT4 Code Description Modifier [id number] - WC PHYS LEVEL 4 - NEW PT 25 ICD-10 Diagnosis Description 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 evidence of n L97.812 Non-pressure chronic ulcer of  other part of right lower leg with fat layer exposed Z79.52 Long term (current) use of systemic steroids D98.33 Chronic systolic (congestive) heart failure Quantity: 1 ecrosis : [id number] - WC PHYS SUBQ TISS 20 SQ CM ICD-10 Diagnosis Description 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

## 2022-09-22 NOTE — Progress Notes (Signed)
[name], [name] ([id number]) Visit Report for 09/20/2022 Abuse Risk Screen Details Patient Name: Date of Service: [name], [name] 09/20/2022 10:30 A M Medical Record Number: [id number] Patient Account Number: [id number] Date of Birth/Sex: Treating RN: 04-11-1946 (76 y.o. [name] Primary Care Dayanne Yiu: [name] Other Clinician: Referring Dugan Vanhoesen: Treating Aynslee Mulhall/Extender: [name] in Treatment: 0 Abuse Risk Screen Items Answer ABUSE RISK SCREEN: Has anyone close to you tried to hurt or harm you recentlyo No Do you feel uncomfortable with anyone in your familyo No Has anyone forced you do things that you didnt want to doo No Electronic Signature(s) Signed: 09/22/2022 4:38:59 PM By: Blanche East RN Entered By: Blanche East on 09/20/2022 11:07:29 -------------------------------------------------------------------------------- Activities of Daily Living Details Patient Name: Date of Service: [name] 09/20/2022 10:30 A M Medical Record Number: [id number] Patient Account Number: [id number] Date of Birth/Sex: Treating RN: Dec 23, 1946 (76 y.o. [name] Primary Care Anahita Cua: [name] Other Clinician: Referring Kanon Novosel: Treating Georgenia Salim/Extender: [name] in Treatment: 0 Activities of Daily Living Items Answer Activities of Daily Living (Please select one for each item) Drive Automobile Completely Able T Medications ake Completely Able Use T elephone Completely Able Care for Appearance Completely Able Use T oilet Completely Able Bath / Shower Completely Able Dress Self Completely Able Feed Self Completely Able Walk Need Assistance Get In / Out Bed Need Assistance Housework Need Assistance Prepare Meals Completely Wirt Completely Able Shop for Self Need Assistance Electronic Signature(s) Signed: 09/22/2022 4:38:59 PM By: Blanche East RN Entered By:  Blanche East on 09/20/2022 11:08:14 -------------------------------------------------------------------------------- Education Screening Details Patient Name: Date of Service: Sherry Masson [name] 09/20/2022 10:30 A M Medical Record Number: [id number] Patient Account Number: [id number] Date of Birth/Sex: Treating RN: 12-16-1946 (76 y.o. [name] Primary Care Shastina Rua: [name] Other Clinician: Referring Karington Zarazua: Treating Murle Otting/Extender: [name] in Treatment: 0 Primary Learner Assessed: Patient Learning Preferences/Education Level/Primary Language Learning Preference: Explanation Highest Education Level: High School Preferred Language: English Cognitive Barrier Language Barrier: No Translator Needed: No Memory Deficit: No Emotional Barrier: No Cultural/Religious Beliefs Affecting Medical Care: No Physical Barrier Impaired Vision: No Impaired Hearing: No Decreased Hand dexterity: No Knowledge/Comprehension Knowledge Level: High Comprehension Level: High Ability to understand written instructions: High Ability to understand verbal instructions: High Motivation Anxiety Level: Calm Cooperation: Cooperative Interest in Health Problems: Asks Questions Perception: Coherent Willingness to Engage in Self-Management High Activities: Readiness to Engage in Self-Management High Activities: Electronic Signature(s) Signed: 09/22/2022 4:38:59 PM By: Blanche East RN Entered By: Blanche East on 09/20/2022 11:08:53 -------------------------------------------------------------------------------- Fall Risk Assessment Details Patient Name: Date of Service: Sherry Masson [name] 09/20/2022 10:30 A M Medical Record Number: [id number] Patient Account Number: [id number] Date of Birth/Sex: Treating RN: 10/05/1946 (76 y.o. [name] Primary Care Caldwell Kronenberger: [name] Other Clinician: Referring Kaio Kuhlman: Treating  Sandro Burgo/Extender: [name] in Treatment: 0 Fall Risk Assessment Items Have you had 2 or more falls in the last 12 monthso 0 Yes Have you had any fall that resulted in injury in the last 12 monthso 0 Yes FALLS RISK SCREEN History of falling - immediate or within 3 months 25 Yes Secondary diagnosis (Do you have 2 or more medical diagnoseso) 15 Yes Ambulatory aid None/bed rest/wheelchair/nurse 0 No Crutches/cane/walker 15 Yes Furniture 0 No Intravenous therapy Access/Saline/Heparin Lock 20 Yes Gait/Transferring Normal/ bed rest/ wheelchair 0 No Weak (short steps with or without  shuffle, stooped but able to lift head while walking, may seek 10 Yes support from furniture) Impaired (short steps with shuffle, may have difficulty arising from chair, head down, impaired 0 No balance) Mental Status Oriented to own ability 0 Yes Electronic Signature(s) Signed: 09/22/2022 4:38:59 PM By: Blanche East RN Entered By: Blanche East on 09/20/2022 11:09:44 -------------------------------------------------------------------------------- Foot Assessment Details Patient Name: Date of Service: Sherry Masson [name] 09/20/2022 10:30 A M Medical Record Number: [id number] Patient Account Number: [id number] Date of Birth/Sex: Treating RN: September 10, 1946 (76 y.o. [name] Primary Care Brigitta Pricer: [name] Other Clinician: Referring Evah Rashid: Treating Ayomikun Starling/Extender: [name] in Treatment: 0 Foot Assessment Items Site Locations + = Sensation present, - = Sensation absent, C = Callus, U = Ulcer R = Redness, W = Warmth, M = Maceration, PU = Pre-ulcerative lesion F = Fissure, S = Swelling, D = Dryness Assessment Right: Left: Other Deformity: No No Prior Foot Ulcer: No No Prior Amputation: No No Charcot Joint: No No Ambulatory Status: Gait: Electronic Signature(s) Signed: 09/22/2022 4:38:59 PM By: Blanche East RN Entered By:  Blanche East on 09/20/2022 11:12:18 -------------------------------------------------------------------------------- Nutrition Risk Screening Details Patient Name: Date of Service: [name] 09/20/2022 10:30 A M Medical Record Number: [id number] Patient Account Number: [id number] Date of Birth/Sex: Treating RN: Nov 07, 1946 (76 y.o. [name] Primary Care Chrishon Martino: [name] Other Clinician: Referring Idara Woodside: Treating Julane Crock/Extender: [name] in Treatment: 0 Height (in): 64 Weight (lbs): 111 Body Mass Index (BMI): 19.1 Nutrition Risk Screening Items Score Screening NUTRITION RISK SCREEN: I have an illness or condition that made me change the kind and/or amount of food I eat 0 No I eat fewer than two meals per day 0 No I eat few fruits and vegetables, or milk products 2 Yes I have three or more drinks of beer, liquor or wine almost every day 2 Yes I have tooth or mouth problems that make it hard for me to eat 0 No I don't always have enough money to buy the food I need 0 No I eat alone most of the time 0 No I take three or more different prescribed or over-the-counter drugs a day 1 Yes Without wanting to, I have lost or gained 10 pounds in the last six months 0 No I am not always physically able to shop, cook and/or feed myself 0 No Nutrition Protocols Good Risk Protocol Moderate Risk Protocol 0 Provide education on nutrition High Risk Proctocol Risk Level: Moderate Risk Score: 5 Electronic Signature(s) Signed: 09/22/2022 4:38:59 PM By: Blanche East RN Entered By: Blanche East on 09/20/2022 11:10:42

## 2022-09-22 NOTE — Progress Notes (Signed)
[name], [name] ([id number]) Visit Report for 09/22/2022 SuperBill Details Patient Name: Date of Service: [name], [name] 09/22/2022 Medical Record Number: [id number] Patient Account Number: [id number] Date of Birth/Sex: Treating RN: 03/23/1946 (77 y.o. 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

## 2022-09-22 NOTE — Progress Notes (Signed)
[name], [name] ([id number]) Visit Report for 09/22/2022 Arrival Information Details Patient Name: Date of Service: [name], [name] 09/22/2022 7:30 A M Medical Record Number: [id number] Patient Account Number: [id number] Date of Birth/Sex: Treating RN: 12-15-1946 (76 y.o. [name] Primary Care Seferina Brokaw: [name] Other Clinician: Referring Sayward Horvath: Treating Shavell Nored/Extender: [name] in Treatment: 0 Visit Information History Since Last Visit All ordered tests and consults were completed: Yes Patient Arrived: Wheel Chair Added or deleted any medications: No Arrival Time: 07:35 Any new allergies or adverse reactions: No Accompanied By: husband Had a fall or experienced change in No Transfer Assistance: None activities of daily living that may affect Patient Identification Verified: Yes risk of falls: Secondary Verification Process Completed: Yes Signs or symptoms of abuse/neglect since last visito No Patient Requires Transmission-Based Precautions: No Hospitalized since last visit: No Patient Has Alerts: No Implantable device outside of the clinic excluding No cellular tissue based products placed in the center since last visit: Has Dressing in Place as Prescribed: Yes Pain Present Now: Yes Electronic Signature(s) Signed: 09/22/2022 4:38:59 PM By: Blanche East RN Entered By: Blanche East on 09/22/2022 07:36:27 -------------------------------------------------------------------------------- Clinic Level of Care Assessment Details Patient Name: Date of Service: [name] 09/22/2022 7:30 A M Medical Record Number: [id number] Patient Account Number: [id number] Date of Birth/Sex: Treating RN: 29-Jul-1946 (76 y.o. [name], [name] Primary Care Weslee Fogg: [name] Other Clinician: Referring Codey Burling: Treating Saladin Petrelli/Extender: [name] in Treatment: 0 Clinic Level of Care  Assessment Items TOOL 4 Quantity Score X- 1 0 Use when only an EandM is performed on FOLLOW-UP visit ASSESSMENTS - Nursing Assessment / Reassessment X- 1 10 Reassessment of Co-morbidities (includes updates in patient status) X- 1 5 Reassessment of Adherence to Treatment Plan ASSESSMENTS - Wound and Skin A ssessment / Reassessment []  - 0 Simple Wound Assessment / Reassessment - one wound X- 1 5 Complex Wound Assessment / Reassessment - multiple wounds []  - 0 Dermatologic / Skin Assessment (not related to wound area) ASSESSMENTS - Focused Assessment []  - 0 Circumferential Edema Measurements - multi extremities []  - 0 Nutritional Assessment / Counseling / Intervention []  - 0 Lower Extremity Assessment (monofilament, tuning fork, pulses) []  - 0 Peripheral Arterial Disease Assessment (using hand held doppler) ASSESSMENTS - Ostomy and/or Continence Assessment and Care []  - 0 Incontinence Assessment and Management []  - 0 Ostomy Care Assessment and Management (repouching, etc.) PROCESS - Coordination of Care []  - 0 Simple Patient / Family Education for ongoing care X- 1 20 Complex (extensive) Patient / Family Education for ongoing care X- 1 10 Staff obtains Programmer, systems, Records, T Results / Process Orders est X- 1 10 Staff telephones HHA, Nursing Homes / Clarify orders / etc []  - 0 Routine Transfer to another Facility (non-emergent condition) []  - 0 Routine Hospital Admission (non-emergent condition) []  - 0 New Admissions / Biomedical engineer / Ordering NPWT Apligraf, etc. , []  - 0 Emergency Hospital Admission (emergent condition) []  - 0 Simple Discharge Coordination []  - 0 Complex (extensive) Discharge Coordination PROCESS - Special Needs []  - 0 Pediatric / Minor Patient Management []  - 0 Isolation Patient Management []  - 0 Hearing / Language / Visual special needs []  - 0 Assessment of Community assistance (transportation, D/C planning, etc.) []  -  0 Additional assistance / Altered mentation []  - 0 Support Surface(s) Assessment (bed, cushion, seat, etc.) INTERVENTIONS - Wound Cleansing / Measurement []  - 0 Simple Wound Cleansing - one wound  X- 1 5 Complex Wound Cleansing - multiple wounds []  - 0 Wound Imaging (photographs - any number of wounds) []  - 0 Wound Tracing (instead of photographs) X- 1 5 Simple Wound Measurement - one wound []  - 0 Complex Wound Measurement - multiple wounds INTERVENTIONS - Wound Dressings []  - 0 Small Wound Dressing one or multiple wounds X- 1 15 Medium Wound Dressing one or multiple wounds []  - 0 Large Wound Dressing one or multiple wounds []  - 0 Application of Medications - topical []  - 0 Application of Medications - injection INTERVENTIONS - Miscellaneous []  - 0 External ear exam []  - 0 Specimen Collection (cultures, biopsies, blood, body fluids, etc.) []  - 0 Specimen(s) / Culture(s) sent or taken to Lab for analysis []  - 0 Patient Transfer (multiple staff / Civil Service fast streamer / Similar devices) []  - 0 Simple Staple / Suture removal (25 or less) []  - 0 Complex Staple / Suture removal (26 or more) []  - 0 Hypo / Hyperglycemic Management (close monitor of Blood Glucose) []  - 0 Ankle / Brachial Index (ABI) - do not check if billed separately X- 1 5 Vital Signs Has the patient been seen at the hospital within the last three years: Yes Total Score: 90 Level Of Care: New/Established - Level 3 Electronic Signature(s) Signed: 09/22/2022 4:38:59 PM By: Blanche East RN Entered By: Blanche East on 09/22/2022 08:02:10 -------------------------------------------------------------------------------- Encounter Discharge Information Details Patient Name: Date of Service: Sherry Masson [name] 09/22/2022 7:30 A M Medical Record Number: [id number] Patient Account Number: [id number] Date of Birth/Sex: Treating RN: July 25, 1946 (76 y.o. [name] Primary Care Robertlee Rogacki: [name] Other  Clinician: Referring Jerrion Tabbert: Treating Esti Demello/Extender: [name] in Treatment: 0 Encounter Discharge Information Items Discharge Condition: Stable Ambulatory Status: Wheelchair Discharge Destination: Home Transportation: Private Auto Accompanied By: husband Schedule Follow-up Appointment: Yes Clinical Summary of Care: Electronic Signature(s) Signed: 09/22/2022 4:38:59 PM By: Blanche East RN Entered By: Blanche East on 09/22/2022 07:54:38 -------------------------------------------------------------------------------- Patient/Caregiver Education Details Patient Name: Date of Service: [name] 9/29/2023andnbsp7:30 A M Medical Record Number: [id number] Patient Account Number: [id number] Date of Birth/Gender: Treating RN: [birth year] (76 y.o. [name] Primary Care Physician: [name] Other Clinician: Referring Physician: Treating Physician/Extender: [name] in Treatment: 0 Education Assessment Education Provided To: Patient Education Topics Provided Peripheral Neuropathy: Methods: Explain/Verbal Responses: Reinforcements needed Wound/Skin Impairment: Methods: Explain/Verbal Responses: Reinforcements needed, State content correctly Electronic Signature(s) Signed: 09/22/2022 4:38:59 PM By: Blanche East RN Entered By: Blanche East on 09/22/2022 07:54:13 -------------------------------------------------------------------------------- Wound Assessment Details Patient Name: Date of Service: Sherry Masson [name] 09/22/2022 7:30 A M Medical Record Number: [id number] Patient Account Number: [id number] Date of Birth/Sex: Treating RN: June 08, 1946 (76 y.o. [name], [name] Primary Care Amari Zagal: [name] Other Clinician: Referring Teejay Meader: Treating Robt Okuda/Extender: [name] in Treatment: 0 Wound Status Wound Number: 1 Primary  Lymphedema Etiology: Wound Location: Right, Dorsal Foot Wound Open Wounding Event: Blister Status: Date Acquired: 08/23/2022 Comorbid Asthma, Chronic Obstructive Pulmonary Disease (COPD), Weeks Of Treatment: 0 History: Congestive Heart Failure, Hypertension, Rheumatoid Arthritis, Clustered Wound: No Neuropathy, Received Chemotherapy Wound Measurements Length: (cm) 1.1 Width: (cm) 1.2 Depth: (cm) 0.6 Area: (cm) 1.037 Volume: (cm) 0.622 % Reduction in Area: 0% % Reduction in Volume: 0% Epithelialization: None Tunneling: No Undermining: Yes Starting Position (o'clock): 12 Ending Position (o'clock): 12 Maximum Distance: (cm) 3.5 Wound Description Classification: Full Thickness Without Exposed Support Structures Wound Margin: Distinct, outline attached Exudate  Amount: Medium Exudate Type: Serosanguineous Exudate Color: red, brown Foul Odor After Cleansing: No Slough/Fibrino Yes Wound Bed Granulation Amount: Small (1-33%) Exposed Structure Granulation Quality: Red Fascia Exposed: No Necrotic Amount: Large (67-100%) Fat Layer (Subcutaneous Tissue) Exposed: Yes Necrotic Quality: Eschar, Adherent Slough Tendon Exposed: No Muscle Exposed: No Joint Exposed: No Treatment Notes Wound #1 (Foot) Wound Laterality: Dorsal, Right Cleanser Soap and Water Discharge Instruction: May shower and wash wound with dial antibacterial soap and water prior to dressing change. Wound Cleanser Discharge Instruction: Cleanse the wound with wound cleanser prior to applying a clean dressing using gauze sponges, not tissue or cotton balls. Peri-Wound Care Sween Lotion (Moisturizing lotion) Discharge Instruction: Apply moisturizing lotion as directed Topical Primary Dressing Iodoform packing strip 1/2 (in) Discharge Instruction: Lightly pack as instructed Secondary Dressing ABD Pad, 5x9 Discharge Instruction: Apply over primary dressing as directed. Zetuvit Plus 4x8 in Discharge  Instruction: Apply over primary dressing as directed. Secured With Transpore Surgical Tape, 2x10 (in/yd) Discharge Instruction: Secure dressing with tape as directed. Compression Wrap Kerlix Roll 4.5x3.1 (in/yd) Discharge Instruction: Apply Kerlix and Coban compression as directed. Coban Self-Adherent Wrap 4x5 (in/yd) Discharge Instruction: Apply over Kerlix as directed. Compression Stockings Add-Ons Electronic Signature(s) Signed: 09/22/2022 4:38:59 PM By: Blanche East RN Entered By: Blanche East on 09/22/2022 07:53:45 -------------------------------------------------------------------------------- Wound Assessment Details Patient Name: Date of Service: Sherry Masson [name] 09/22/2022 7:30 A M Medical Record Number: [id number] Patient Account Number: [id number] Date of Birth/Sex: Treating RN: [birth year] (76 y.o. [name], [name] Primary Care Quention Mcneill: [name] Other Clinician: Referring Albany Winslow: Treating Shakedra Beam/Extender: [name] in Treatment: 0 Wound Status Wound Number: 2 Primary Abrasion Etiology: Wound Location: Right, Lateral Lower Leg Wound Open Wounding Event: Skin Tear/Laceration Status: Date Acquired: 09/20/2022 Comorbid Asthma, Chronic Obstructive Pulmonary Disease (COPD), Weeks Of Treatment: 0 History: Congestive Heart Failure, Hypertension, Rheumatoid Arthritis, Clustered Wound: No Neuropathy, Received Chemotherapy Wound Measurements Length: (cm) 1.5 Width: (cm) 0.6 Depth: (cm) 0.1 Area: (cm) 0.707 Volume: (cm) 0.071 % Reduction in Area: 0% % Reduction in Volume: 0% Epithelialization: None Tunneling: No Undermining: No Wound Description Classification: Full Thickness Without Exposed Support Structures Wound Margin: Distinct, outline attached Exudate Amount: Medium Exudate Type: Serosanguineous Exudate Color: red, brown Foul Odor After Cleansing: No Slough/Fibrino Yes Wound Bed Granulation Amount: Medium  (34-66%) Exposed Structure Granulation Quality: Red Fascia Exposed: No Necrotic Amount: Medium (34-66%) Fat Layer (Subcutaneous Tissue) Exposed: Yes Necrotic Quality: Eschar, Adherent Slough Tendon Exposed: No Muscle Exposed: No Joint Exposed: No Bone Exposed: No Treatment Notes Wound #2 (Lower Leg) Wound Laterality: Right, Lateral Cleanser Soap and Water Discharge Instruction: May shower and wash wound with dial antibacterial soap and water prior to dressing change. Wound Cleanser Discharge Instruction: Cleanse the wound with wound cleanser prior to applying a clean dressing using gauze sponges, not tissue or cotton balls. Peri-Wound Care Sween Lotion (Moisturizing lotion) Discharge Instruction: Apply moisturizing lotion as directed Topical Primary Dressing KerraCel Ag Gelling Fiber Dressing, 2x2 in (silver alginate) Discharge Instruction: Apply silver alginate to wound bed as instructed Secondary Dressing ABD Pad, 5x9 Discharge Instruction: Apply over primary dressing as directed. Woven Gauze Sponge, Non-Sterile 4x4 in Discharge Instruction: Apply over primary dressing as directed. Secured With Transpore Surgical Tape, 2x10 (in/yd) Discharge Instruction: Secure dressing with tape as directed. Compression Wrap Kerlix Roll 4.5x3.1 (in/yd) Discharge Instruction: Apply Kerlix and Coban compression as directed. Coban Self-Adherent Wrap 4x5 (in/yd) Discharge Instruction: Apply over Kerlix as directed. Compression Stockings Add-Ons Electronic Signature(s) Signed: 09/22/2022 4:38:59  PM By: Blanche East RN Entered By: Blanche East on 09/22/2022 07:53:51 -------------------------------------------------------------------------------- [place] Details Patient Name: Date of Service: Sherry Masson [name] 09/22/2022 7:30 A M Medical Record Number: [id number] Patient Account Number: [id number] Date of Birth/Sex: Treating RN: [birth year] (76 y.o. [name], [name] Primary Care  Tyaira Heward: [name] Other Clinician: Referring Ashlee Bewley: Treating Regla Fitzgibbon/Extender: [name] in Treatment: 0 Vital Signs Time Taken: 07:36 Temperature (F): 97.6 Height (in): 64 Pulse (bpm): 103 Weight (lbs): 111 Respiratory Rate (breaths/min): 18 Body Mass Index (BMI): 19.1 Blood Pressure (mmHg): 121/87 Reference Range: 80 - 120 mg / dl Electronic Signature(s) Signed: 09/22/2022 4:38:59 PM By: Blanche East RN Entered By: Blanche East on 09/22/2022 07:53:04

## 2022-09-26 DIAGNOSIS — S81801D Unspecified open wound, right lower leg, subsequent encounter: Secondary | ICD-10-CM | POA: Diagnosis not present

## 2022-09-26 DIAGNOSIS — Z79899 Other long term (current) drug therapy: Secondary | ICD-10-CM | POA: Diagnosis not present

## 2022-09-26 DIAGNOSIS — M0579 Rheumatoid arthritis with rheumatoid factor of multiple sites without organ or systems involvement: Secondary | ICD-10-CM | POA: Diagnosis not present

## 2022-09-26 DIAGNOSIS — E790 Hyperuricemia without signs of inflammatory arthritis and tophaceous disease: Secondary | ICD-10-CM | POA: Diagnosis not present

## 2022-09-26 DIAGNOSIS — M1991 Primary osteoarthritis, unspecified site: Secondary | ICD-10-CM | POA: Diagnosis not present

## 2022-09-26 DIAGNOSIS — Z681 Body mass index (BMI) 19 or less, adult: Secondary | ICD-10-CM | POA: Diagnosis not present

## 2022-09-26 DIAGNOSIS — C9110 Chronic lymphocytic leukemia of B-cell type not having achieved remission: Secondary | ICD-10-CM | POA: Diagnosis not present

## 2022-09-27 ENCOUNTER — Ambulatory Visit (INDEPENDENT_AMBULATORY_CARE_PROVIDER_SITE_OTHER): Payer: Medicare Other | Admitting: Pulmonary Disease

## 2022-09-27 ENCOUNTER — Encounter (HOSPITAL_BASED_OUTPATIENT_CLINIC_OR_DEPARTMENT_OTHER): Payer: Medicare Other | Admitting: General Surgery

## 2022-09-27 ENCOUNTER — Ambulatory Visit (HOSPITAL_BASED_OUTPATIENT_CLINIC_OR_DEPARTMENT_OTHER): Payer: Medicare Other | Admitting: General Surgery

## 2022-09-27 ENCOUNTER — Ambulatory Visit: Payer: Medicare Other

## 2022-09-27 ENCOUNTER — Encounter: Payer: Self-pay | Admitting: Pulmonary Disease

## 2022-09-27 VITALS — BP 98/75 | HR 78 | Ht 63.0 in | Wt 111.0 lb

## 2022-09-27 DIAGNOSIS — I480 Paroxysmal atrial fibrillation: Secondary | ICD-10-CM | POA: Diagnosis not present

## 2022-09-27 DIAGNOSIS — D631 Anemia in chronic kidney disease: Secondary | ICD-10-CM | POA: Diagnosis not present

## 2022-09-27 DIAGNOSIS — R918 Other nonspecific abnormal finding of lung field: Secondary | ICD-10-CM | POA: Diagnosis not present

## 2022-09-27 DIAGNOSIS — I5022 Chronic systolic (congestive) heart failure: Secondary | ICD-10-CM | POA: Diagnosis not present

## 2022-09-27 DIAGNOSIS — J189 Pneumonia, unspecified organism: Secondary | ICD-10-CM

## 2022-09-27 DIAGNOSIS — Z87898 Personal history of other specified conditions: Secondary | ICD-10-CM

## 2022-09-27 DIAGNOSIS — J449 Chronic obstructive pulmonary disease, unspecified: Secondary | ICD-10-CM

## 2022-09-27 DIAGNOSIS — N1831 Chronic kidney disease, stage 3a: Secondary | ICD-10-CM | POA: Diagnosis not present

## 2022-09-27 DIAGNOSIS — C931 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: Secondary | ICD-10-CM

## 2022-09-27 DIAGNOSIS — C9590 Leukemia, unspecified not having achieved remission: Secondary | ICD-10-CM | POA: Diagnosis not present

## 2022-09-27 DIAGNOSIS — I130 Hypertensive heart and chronic kidney disease with heart failure and stage 1 through stage 4 chronic kidney disease, or unspecified chronic kidney disease: Secondary | ICD-10-CM | POA: Diagnosis not present

## 2022-09-27 NOTE — Patient Instructions (Signed)
Thank you for visiting Dr. Valeta Harms at [facility]. Today we recommend the following:  Orders Placed This Encounter  Procedures   DG Chest 2 View   Follow up with CXR 2v Prior to next office visit.  Continue stiolto   Return in about 4 weeks (around 10/25/2022) for with APP.    Please do your part to reduce the spread of COVID-19.

## 2022-09-27 NOTE — Progress Notes (Signed)
Synopsis: Referred in August 2020 for shortness of breath by [name], MD  Subjective:   PATIENT ID: [name] GENDER: female DOB: Jul 26, 1946, MRN: [id number]  Chief Complaint  Patient presents with   Follow-up    This is a 76 year old female past medical history of rheumatoid arthritis + remicade q8weeks, celiac disease, CKD stage III, former smoker quit 15 years ago, 30+-pack-year history of smoking, COPD Moderate GOLD 2, 77% predicted, 1.7L.  Lung RADS 4 a on most recent low-dose lung cancer screening.  Patient was scheduled to follow-up with me regarding abnormal lung nodule.  Follows with [facility].  OV 08/06/2019: she has been smoking for many years, quit 16 years ago. She stopped 2 times in her life. She quit the first time in her 85s. She was enrolled LDCT and doing well.  She is concerned because she watched her sister have continued decline from COPD.  She also saw her mother died from COPD.  She is not been on any maintenance medications before.  She is only been using as needed short acting Combivent.  She does have exertional dyspnea.  She does try to exercise regularly.  She feels some shortness of breath with exercise but otherwise doing well.  OV 11/12/2019: Low-dose lung cancer screening CT completed in October.  Lung nodule followed in the lingula has resolved. Overall, she is doing well today. Breathlessness is stable.  Overall doing well today.  She does feels her breathing is stable.  Today in the office we reviewed her CT imaging.  Her joints in her hands tend to bother her towards the last few weeks before its time to get her next Remicade injection.  She is currently taking prednisone 5 mg every other day.  She has been working with her rheumatologist to try to come off of this if at all possible.  Currently doing well with her inhaler regimen.  And glad that she was able to switch.  Husband is currently out of town on a fishing/hunting trip in  the [place].  OV 02/25/2020: Patient here today for follow-up after recent HRCT imaging.  Multiple small pulmonary nodules stable evidence of centrilobular and paraseptal emphysema.  No evidence of ILD.  Did review images today in the office.  Patient currently managed with Stiolto.  Breathing well at this time.  She does complain of thoracic back pain shoulder pain arm pain hand pain all the which is chronic for her and related to her prior rheumatoid arthritis history.  She still is receiving her Remicade infusions.  She did receive both of her COVID-19 vaccinations.  OV 03/10/2022: Here today for follow-up.  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  From a respiratory standpoint she is doing okay.  But she does feel short of breath with exertion.  OV 09/27/2022: Here today for follow-up.  Last seen in the office in March.  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  Currently undergoing chemotherapy treatments.  She is also receiving infliximab infusions with rheumatology.  Initially was seen for lung nodules.  She does have a 30+ pack year history of smoking.  She had a CT scan of the chest completed in June 2023.  Was reviewed today in the office.She had numerous right-sided pleural-based metastasis seen in April 2023 CT chest.  She had a right sided paraspinal soft tissue mass at the level of T10.     Past Medical History:  Diagnosis Date   Arthritis    Rheumatoid arthritis   Celiac disease    Chronic kidney disease    stage 3 from MD notes   COPD (chronic obstructive pulmonary disease) (HCC)    Dyspnea    with going up stairs   Family history of adverse reaction to anesthesia    father had hard time waking up   Headache    sinus headaches   Hot flashes    Hypertension    Iron deficiency anemia    Pneumonia    per patient "I have walking pneumonia"     Family History  Problem Relation Age of Onset   Peripheral  Artery Disease Father    Diabetes Father      Past Surgical History:  Procedure Laterality Date   COLONOSCOPY     ECTOPIC PREGNANCY SURGERY      x 2   IR IMAGING GUIDED PORT INSERTION  01/31/2022   REVERSE SHOULDER ARTHROPLASTY Right 02/02/2017   Procedure: RIGHT REVERSE SHOULDER ARTHROPLASTY;  Surgeon: Netta Cedars, MD;  Location: [facility];  Service: Orthopedics;  Laterality: Right;    Social History   Socioeconomic History   Marital status: Married    Spouse name: Not on file   Number of children: Not on file   Years of education: Not on file   Highest education level: Not on file  Occupational History   Not on file  Tobacco Use   Smoking status: Former    Packs/day: 1.00    Years: 30.00    Total pack years: 30.00    Types: Cigarettes    Quit date: 2005    Years since quitting: 18.7   Smokeless tobacco: Never  Vaping Use   Vaping Use: Never used  Substance and Sexual Activity   Alcohol use: Yes    Alcohol/week: 12.0 - 14.0 standard drinks of alcohol    Types: 12 - 14 Glasses of wine per week   Drug use: No   Sexual activity: Not Currently  Other Topics Concern   Not on file  Social History Narrative   Not on file   Social Determinants of Health   Financial Resource Strain: Not on file  Food Insecurity: Not on file  Transportation Needs: Not on file  Physical Activity: Not on file  Stress: Not on file  Social Connections: Not on file  Intimate Partner Violence: Not on file     Allergies  Allergen Reactions   Digoxin And Related Rash    Generalized total body rash with immense itching   Gluten Meal Diarrhea   Penicillins Rash and Other (See Comments)      Did it involve swelling of the face/tongue/throat, SOB, or low BP? No Did it involve sudden or severe rash/hives, skin peeling, or any reaction on the inside of your mouth or nose? Yes Did you need to seek medical attention at a hospital or doctor's office? No When did it last happen?    "Many Years Ago"   If all above answers are "NO", may proceed with cephalosporin use.      Alendronate Rash   Hydroxychloroquine Rash     Outpatient Medications Prior to Visit  Medication Sig Dispense Refill   predniSONE (DELTASONE) 5 MG tablet Take 5 mg by mouth daily with breakfast.     Tiotropium Bromide-Olodaterol (STIOLTO RESPIMAT) 2.5-2.5 MCG/ACT AERS Inhale 2 puffs into the lungs daily. 4 g 5   denosumab (PROLIA) 60 MG/ML SOSY injection Inject 60 mg into  the skin every 6 (six) months.     diltiazem (CARDIZEM CD) 240 MG 24 hr capsule Take 1 capsule (240 mg total) by mouth daily. 30 capsule 6   furosemide (LASIX) 20 MG tablet Take 1 tablet (20 mg total) by mouth daily. 30 tablet 11   gabapentin (NEURONTIN) 300 MG capsule Take 300 mg by mouth in the morning.     InFLIXimab (REMICADE IV) Inject 10 mg into the vein every 2 (two) months.     Metoprolol Tartrate 75 MG TABS Take 150 mg by mouth 2 (two) times daily. 360 tablet 3   mirtazapine (REMERON) 7.5 MG tablet Take 1 tablet (7.5 mg total) by mouth at bedtime. 30 tablet 0   potassium chloride SA (KLOR-CON M20) 20 MEQ tablet Take 1 tablet (20 mEq total) by mouth daily. Take with furosemide 90 tablet 3   Facility-Administered Medications Prior to Visit  Medication Dose Route Frequency Provider Last Rate Last Admin   acetaminophen (TYLENOL) 325 MG tablet            diphenhydrAMINE (BENADRYL) 25 mg capsule             Review of Systems  Constitutional:  Positive for malaise/fatigue. Negative for chills, fever and weight loss.  HENT:  Negative for hearing loss, sore throat and tinnitus.   Eyes:  Negative for blurred vision and double vision.  Respiratory:  Positive for shortness of breath. Negative for cough, hemoptysis, sputum production, wheezing and stridor.   Cardiovascular:  Negative for chest pain, palpitations, orthopnea, leg swelling and PND.  Gastrointestinal:  Negative for abdominal pain, constipation, diarrhea, heartburn, nausea and  vomiting.  Genitourinary:  Negative for dysuria, hematuria and urgency.  Musculoskeletal:  Negative for joint pain and myalgias.  Skin:  Negative for itching and rash.  Neurological:  Negative for dizziness, tingling, weakness and headaches.  Endo/Heme/Allergies:  Negative for environmental allergies. Does not bruise/bleed easily.  Psychiatric/Behavioral:  Negative for depression. The patient is not nervous/anxious and does not have insomnia.   All other systems reviewed and are negative.    Objective:  Physical Exam Vitals reviewed.  Constitutional:      General: She is not in acute distress.    Appearance: She is well-developed.  HENT:     Head: Normocephalic and atraumatic.  Eyes:     General: No scleral icterus.    Conjunctiva/sclera: Conjunctivae normal.     Pupils: Pupils are equal, round, and reactive to light.  Neck:     Vascular: No JVD.     Trachea: No tracheal deviation.  Cardiovascular:     Rate and Rhythm: Normal rate and regular rhythm.     Heart sounds: Normal heart sounds. No murmur heard. Pulmonary:     Effort: Pulmonary effort is normal. No tachypnea, accessory muscle usage or respiratory distress.     Breath sounds: No stridor. No wheezing, rhonchi or rales.  Abdominal:     General: There is no distension.     Palpations: Abdomen is soft.     Tenderness: There is no abdominal tenderness.  Musculoskeletal:        General: No tenderness.     Cervical back: Neck supple.  Lymphadenopathy:     Cervical: No cervical adenopathy.  Skin:    General: Skin is warm and dry.     Capillary Refill: Capillary refill takes less than 2 seconds.     Findings: No rash.  Neurological:     Mental Status: She is alert and oriented  to person, place, and time.  Psychiatric:        Behavior: Behavior normal.      Vitals:   09/27/22 1323  BP: 98/75  Pulse: 78  SpO2: 98%  Weight: 111 lb (50.3 kg)  Height: 5' 3"  (1.6 m)   98% on RA BMI Readings from Last 3  Encounters:  09/27/22 19.66 kg/m  09/18/22 19.73 kg/m  09/04/22 20.55 kg/m   Wt Readings from Last 3 Encounters:  09/27/22 111 lb (50.3 kg)  09/18/22 111 lb 6.4 oz (50.5 kg)  09/04/22 116 lb (52.6 kg)     CBC    Component Value Date/Time   WBC 8.5 09/20/2022 1359   RBC 2.63 (L) 09/20/2022 1359   HGB 9.1 (L) 09/20/2022 1359   HGB 10.0 (L) 09/18/2022 1217   HGB 10.8 (L) 01/03/2017 1246   HCT 28.6 (L) 09/20/2022 1359   HCT 32.8 (L) 01/03/2017 1246   PLT 123 (L) 09/20/2022 1359   PLT 172 09/18/2022 1217   PLT 163 01/03/2017 1246   MCV 108.7 (H) 09/20/2022 1359   MCV 102.8 (H) 01/03/2017 1246   MCH 34.6 (H) 09/20/2022 1359   MCHC 31.8 09/20/2022 1359   RDW 17.0 (H) 09/20/2022 1359   RDW 14.3 01/03/2017 1246   LYMPHSABS 1.0 09/20/2022 1359   LYMPHSABS 2.9 01/03/2017 1246   MONOABS 1.8 (H) 09/20/2022 1359   MONOABS 1.1 (H) 01/03/2017 1246   EOSABS 0.0 09/20/2022 1359   EOSABS 0.1 01/03/2017 1246   BASOSABS 0.0 09/20/2022 1359   BASOSABS 0.0 01/03/2017 1246    Chest Imaging: 07/07/2019: CT lung cancer screening: Lung RADS 4 a, right lower lobe patchy opacities.  Significant emphysema.  10/08/2019 low-dose lung cancer screening CT: Lung RADS 2, inferior lingular nodule resolved.  Now quit greater than 15 years no additional need for low-dose lung cancer screening.  Septal thickening in the bases concerning for potential ILD.  02/20/2020 CT chest: Multiple small pulmonary nodules 5 mm or less in size.  Stable from previous imaging evidence of mild centrilobular and paraseptal emphysema. The patient's images have been independently reviewed by me.    12/23/2021 CT chest abdomen pelvis: Interval enlargement in the 7.2 cm soft tissue presacral mass.  Pleural-based nodule. Also with a paraspinous soft tissue mass overlying the right aspect of the T10 vertebral body. The patient's images have been independently reviewed by me.    April 2023 CT chest: Pleural-based lesions as  well as T10 paraspinal soft tissue lesion. The patient's images have been independently reviewed by me.     Pulmonary Functions Testing Results:    Latest Ref Rng & Units 05/03/2021    1:55 PM 01/26/2016    3:31 PM  PFT Results  FVC-Pre L 2.19  2.31   FVC-Predicted Pre % 80  79   FVC-Post L 2.21  2.45   FVC-Predicted Post % 80  84   Pre FEV1/FVC % % 68  69   Post FEV1/FCV % % 68  69   FEV1-Pre L 1.49  1.60   FEV1-Predicted Pre % 72  72   FEV1-Post L 1.51  1.70   DLCO uncorrected ml/min/mmHg 10.27  12.99   DLCO UNC% % 55  56   DLCO corrected ml/min/mmHg 10.27    DLCO COR %Predicted % 55    DLVA Predicted % 72  70   TLC L 4.39  4.25   TLC % Predicted % 89  86   RV % Predicted % 100  84     FeNO: None   Pathology: None  Echocardiogram: None   Heart Catheterization: None     Assessment & Plan:     ICD-10-CM   1. Stage 2 moderate COPD by GOLD classification (HCC)  J44.9     2. Multiple pulmonary nodules  R91.8     3. 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 Hx of multiple pulmonary nodules  Z87.898     5. Paroxysmal atrial fibrillation (HCC)  I48.0     6. Community acquired pneumonia of right upper lobe of lung  J18.9        Discussion:  This is a 76 year old female initially seen for an abnormal lung cancer screening CT with multiple pulmonary nodules.  Had a recent HRCT with stability of those nodules subsequent follow-up with enlargement of a new presacral mass and a paraspinal soft tissue density underwent CT-guided diagnosis confirming CMML.  Currently undergoing treatments with medical oncology.  She does have stage II COPD currently managed with Stiolto.  Plan: Continue Stiolto. She recently had community-acquired pneumonia worse on the right side than the left. We will have a repeat chest x-ray to be completed 6 weeks after her previous. Patient to return to see [place] in approximately 4 weeks with a two-view chest  x-ray. She is overall feeling better. Repeat chest x-ray and return to clinic in 4 weeks.    Current Outpatient Medications:    predniSONE (DELTASONE) 5 MG tablet, Take 5 mg by mouth daily with breakfast., Disp: , Rfl:    Tiotropium Bromide-Olodaterol (STIOLTO RESPIMAT) 2.5-2.5 MCG/ACT AERS, Inhale 2 puffs into the lungs daily., Disp: 4 g, Rfl: 5   denosumab (PROLIA) 60 MG/ML SOSY injection, Inject 60 mg into the skin every 6 (six) months., Disp: , Rfl:    diltiazem (CARDIZEM CD) 240 MG 24 hr capsule, Take 1 capsule (240 mg total) by mouth daily., Disp: 30 capsule, Rfl: 6   furosemide (LASIX) 20 MG tablet, Take 1 tablet (20 mg total) by mouth daily., Disp: 30 tablet, Rfl: 11   gabapentin (NEURONTIN) 300 MG capsule, Take 300 mg by mouth in the morning., Disp: , Rfl:    InFLIXimab (REMICADE IV), Inject 10 mg into the vein every 2 (two) months., Disp: , Rfl:    Metoprolol Tartrate 75 MG TABS, Take 150 mg by mouth 2 (two) times daily., Disp: 360 tablet, Rfl: 3   mirtazapine (REMERON) 7.5 MG tablet, Take 1 tablet (7.5 mg total) by mouth at bedtime., Disp: 30 tablet, Rfl: 0   potassium chloride SA (KLOR-CON M20) 20 MEQ tablet, Take 1 tablet (20 mEq total) by mouth daily. Take with furosemide, Disp: 90 tablet, Rfl: 3 No current facility-administered medications for this visit.  Facility-Administered Medications Ordered in Other Visits:    acetaminophen (TYLENOL) 325 MG tablet, , , ,    diphenhydrAMINE (BENADRYL) 25 mg capsule, , , ,    Garner Nash, DO [facility] 09/27/2022 1:31 PM

## 2022-09-28 DIAGNOSIS — D631 Anemia in chronic kidney disease: Secondary | ICD-10-CM | POA: Diagnosis not present

## 2022-09-28 DIAGNOSIS — N1831 Chronic kidney disease, stage 3a: Secondary | ICD-10-CM | POA: Diagnosis not present

## 2022-09-28 DIAGNOSIS — I130 Hypertensive heart and chronic kidney disease with heart failure and stage 1 through stage 4 chronic kidney disease, or unspecified chronic kidney disease: Secondary | ICD-10-CM | POA: Diagnosis not present

## 2022-09-28 DIAGNOSIS — I5022 Chronic systolic (congestive) heart failure: Secondary | ICD-10-CM | POA: Diagnosis not present

## 2022-09-28 DIAGNOSIS — C9590 Leukemia, unspecified not having achieved remission: Secondary | ICD-10-CM | POA: Diagnosis not present

## 2022-09-28 DIAGNOSIS — J449 Chronic obstructive pulmonary disease, unspecified: Secondary | ICD-10-CM | POA: Diagnosis not present

## 2022-09-29 ENCOUNTER — Encounter: Payer: Self-pay | Admitting: Cardiovascular Disease

## 2022-09-29 ENCOUNTER — Encounter (HOSPITAL_BASED_OUTPATIENT_CLINIC_OR_DEPARTMENT_OTHER): Payer: Medicare Other | Attending: General Surgery | Admitting: General Surgery

## 2022-09-29 DIAGNOSIS — I7090 Unspecified atherosclerosis: Secondary | ICD-10-CM | POA: Diagnosis not present

## 2022-09-29 DIAGNOSIS — N183 Chronic kidney disease, stage 3 unspecified: Secondary | ICD-10-CM | POA: Insufficient documentation

## 2022-09-29 DIAGNOSIS — M069 Rheumatoid arthritis, unspecified: Secondary | ICD-10-CM | POA: Diagnosis not present

## 2022-09-29 DIAGNOSIS — I5022 Chronic systolic (congestive) heart failure: Secondary | ICD-10-CM | POA: Insufficient documentation

## 2022-09-29 DIAGNOSIS — L97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 evidence of necrosis: Secondary | ICD-10-CM | POA: Insufficient documentation

## 2022-09-29 DIAGNOSIS — S81811A Laceration without foreign body, right lower leg, initial encounter: Secondary | ICD-10-CM | POA: Diagnosis not present

## 2022-09-29 DIAGNOSIS — E11622 Type 2 diabetes mellitus with other skin ulcer: Secondary | ICD-10-CM | POA: Insufficient documentation

## 2022-09-29 DIAGNOSIS — E1122 Type 2 diabetes mellitus with diabetic chronic kidney disease: Secondary | ICD-10-CM | POA: Diagnosis not present

## 2022-09-29 DIAGNOSIS — E11621 Type 2 diabetes mellitus with foot ulcer: Secondary | ICD-10-CM | POA: Insufficient documentation

## 2022-09-29 DIAGNOSIS — C931 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: Secondary | ICD-10-CM | POA: Diagnosis not present

## 2022-09-29 DIAGNOSIS — I130 Hypertensive heart and chronic kidney disease with heart failure and stage 1 through stage 4 chronic kidney disease, or unspecified chronic kidney disease: Secondary | ICD-10-CM | POA: Diagnosis not present

## 2022-09-29 DIAGNOSIS — L97812 Non-pressure chronic ulcer of other part of right lower leg with fat layer exposed: Secondary | ICD-10-CM | POA: Insufficient documentation

## 2022-09-29 DIAGNOSIS — I890 Lymphedema, not elsewhere classified: Secondary | ICD-10-CM | POA: Diagnosis not present

## 2022-09-29 DIAGNOSIS — L97512 Non-pressure chronic ulcer of other part of right foot with fat layer exposed: Secondary | ICD-10-CM | POA: Diagnosis not present

## 2022-09-29 NOTE — Progress Notes (Signed)
CELINES, FEMIA ([id number]) Visit Report for 09/29/2022 Arrival Information Details Patient Name: Date of Service: [name], [name] 09/29/2022 2:30 PM Medical Record Number: [id number] Patient Account Number: [id number] Date of Birth/Sex: Treating RN: Aug 27, 1946 (76 y.o. [name], [name] Primary Care Keyante Durio: [name] Other Clinician: Referring Sarah Zerby: Treating Shagun Wordell/Extender: [name] in Treatment: 1 Visit Information History Since Last Visit All ordered tests and consults were completed: Yes Patient Arrived: Ambulatory Added or deleted any medications: No Arrival Time: 14:34 Any new allergies or adverse reactions: No Accompanied By: husband Had a fall or experienced change in No Transfer Assistance: None activities of daily living that may affect Patient Identification Verified: Yes risk of falls: Secondary Verification Process Completed: Yes Signs or symptoms of abuse/neglect since last visito No Patient Requires Transmission-Based Precautions: No Hospitalized since last visit: No Patient Has Alerts: No Implantable device outside of the clinic excluding No cellular tissue based products placed in the center since last visit: Has Dressing in Place as Prescribed: Yes Pain Present Now: Yes Electronic Signature(s) Signed: 09/29/2022 5:02:24 PM By: Blanche East RN Entered By: Blanche East on 09/29/2022 14:35:08 -------------------------------------------------------------------------------- Encounter Discharge Information Details Patient Name: Date of Service: Sherry Masson [name] 09/29/2022 2:30 PM Medical Record Number: [id number] Patient Account Number: [id number] Date of Birth/Sex: Treating RN: 02-Jul-1946 (76 y.o. Marta Lamas Primary Care Danielle Mink: [name] Other Clinician: Referring Kayelynn Abdou: Treating Karysa Heft/Extender: [name] in Treatment: 1 Encounter Discharge Information  Items Post Procedure Vitals Discharge Condition: Stable Temperature (F): 98.7 Ambulatory Status: Wheelchair Pulse (bpm): 98 Discharge Destination: Home Respiratory Rate (breaths/min): 18 Transportation: Private Auto Blood Pressure (mmHg): 134/75 Accompanied By: husband Schedule Follow-up Appointment: Yes Clinical Summary of Care: Electronic Signature(s) Signed: 09/29/2022 5:02:24 PM By: Blanche East RN Entered By: Blanche East on 09/29/2022 15:22:19 -------------------------------------------------------------------------------- Lower Extremity Assessment Details Patient Name: Date of Service: [name], Sherry [name] 09/29/2022 2:30 PM Medical Record Number: [id number] Patient Account Number: [id number] Date of Birth/Sex: Treating RN: 1946/11/13 (76 y.o. Marta Lamas Primary Care Kenyanna Grzesiak: [name] Other Clinician: Referring Zakry Caso: Treating Dejion Grillo/Extender: [name] in Treatment: 1 Edema Assessment Assessed: [Left: No] [Right: No] E[Left: dema] [Right: :] Calf Left: Right: Point of Measurement: From Medial Instep 36 cm Ankle Left: Right: Point of Measurement: From Medial Instep 21 cm Vascular Assessment Pulses: Dorsalis Pedis Palpable: [Right:Yes] Electronic Signature(s) Signed: 09/29/2022 5:02:24 PM By: Blanche East RN Entered By: Blanche East on 09/29/2022 14:41:50 -------------------------------------------------------------------------------- Multi Wound Chart Details Patient Name: Date of Service: Sherry Masson RO Ovidio Hanger. 09/29/2022 2:30 PM Medical Record Number: [id number] Patient Account Number: [id number] Date of Birth/Sex: Treating RN: 29-May-1946 (76 y.o. F) Primary Care Kayce Chismar: [name] Other Clinician: Referring Renn Dirocco: Treating Maryjayne Kleven/Extender: [name] in Treatment: 1 Vital Signs Height(in): 78 Pulse(bpm): 44 Weight(lbs): 111 Blood Pressure(mmHg): 134/75 Body  Mass Index(BMI): 19.1 Temperature(F): 98.7 Respiratory Rate(breaths/min): 18 Photos: [N/A:N/A] Right, Dorsal Foot Right, Lateral Lower Leg N/A Wound Location: Blister Skin Tear/Laceration N/A Wounding Event: Lymphedema Abrasion N/A Primary Etiology: Asthma, Chronic Obstructive Asthma, Chronic Obstructive N/A Comorbid History: Pulmonary Disease (COPD), Pulmonary Disease (COPD), Congestive Heart Failure, Congestive Heart Failure, Hypertension, Rheumatoid Arthritis, Hypertension, Rheumatoid Arthritis, Neuropathy, Received Chemotherapy Neuropathy, Received Chemotherapy 08/23/2022 09/20/2022 N/A Date Acquired: 1 1 N/A Weeks of Treatment: Open Open N/A Wound Status: No No N/A Wound Recurrence: 1.1x1.1x0.6 1.5x0.5x0.1 N/A Measurements L x W x D (cm) 0.95 0.589 N/A A (cm) : rea 0.57 0.059  N/A Volume (cm) : 8.40% 16.70% N/A % Reduction in A rea: 8.40% 16.90% N/A % Reduction in Volume: 12 Position 1 (o'clock): 2.4 Maximum Distance 1 (cm): 12 Starting Position 1 (o'clock): 12 Ending Position 1 (o'clock): 0.4 Maximum Distance 1 (cm): Yes No N/A Tunneling: Yes No N/A Undermining: Full Thickness Without Exposed Full Thickness Without Exposed N/A Classification: Support Structures Support Structures Medium Medium N/A Exudate A mount: Serosanguineous Serosanguineous N/A Exudate Type: red, brown red, brown N/A Exudate Color: Distinct, outline attached Distinct, outline attached N/A Wound Margin: Small (1-33%) Medium (34-66%) N/A Granulation A mount: Red Red N/A Granulation Quality: Large (67-100%) Medium (34-66%) N/A Necrotic A mount: Eschar, Adherent Slough Eschar, Adherent Slough N/A Necrotic Tissue: Fat Layer (Subcutaneous Tissue): Yes Fat Layer (Subcutaneous Tissue): Yes N/A Exposed Structures: Fascia: No Fascia: No Tendon: No Tendon: No Muscle: No Muscle: No Joint: No Joint: No Bone: No None None N/A Epithelialization: Debridement - Excisional  N/A N/A Debridement: Pre-procedure Verification/Time Out 14:56 N/A N/A Taken: Lidocaine 4% Topical Solution N/A N/A Pain Control: Subcutaneous, Slough N/A N/A Tissue Debrided: Skin/Subcutaneous Tissue N/A N/A Level: 1.21 N/A N/A Debridement A (sq cm): rea Curette N/A N/A Instrument: Minimum N/A N/A Bleeding: Pressure N/A N/A Hemostasis A chieved: 0 N/A N/A Procedural Pain: 0 N/A N/A Post Procedural Pain: Procedure was tolerated well N/A N/A Debridement Treatment Response: 1.1x1.1x0.6 N/A N/A Post Debridement Measurements L x W x D (cm) 0.57 N/A N/A Post Debridement Volume: (cm) Maceration: Yes Dry/Scaly: Yes N/A Periwound Skin Moisture: Dry/Scaly: Yes Debridement N/A N/A Procedures Performed: Treatment Notes Wound #1 (Foot) Wound Laterality: Dorsal, Right Cleanser Soap and Water Discharge Instruction: May shower and wash wound with dial antibacterial soap and water prior to dressing change. Wound Cleanser Discharge Instruction: Cleanse the wound with wound cleanser prior to applying a clean dressing using gauze sponges, not tissue or cotton balls. Peri-Wound Care Sween Lotion (Moisturizing lotion) Discharge Instruction: Apply moisturizing lotion as directed Topical Primary Dressing Iodoform packing strip 1/2 (in) Discharge Instruction: Lightly pack as instructed Secondary Dressing ABD Pad, 5x9 Discharge Instruction: Apply over primary dressing as directed. Zetuvit Plus 4x8 in Discharge Instruction: Apply over primary dressing as directed. Secured With Transpore Surgical Tape, 2x10 (in/yd) Discharge Instruction: Secure dressing with tape as directed. Compression Wrap Kerlix Roll 4.5x3.1 (in/yd) Discharge Instruction: Apply Kerlix and Coban compression as directed. Coban Self-Adherent Wrap 4x5 (in/yd) Discharge Instruction: Apply over Kerlix as directed. Compression Stockings Add-Ons Wound #2 (Lower Leg) Wound Laterality: Right, Lateral Cleanser Soap  and Water Discharge Instruction: May shower and wash wound with dial antibacterial soap and water prior to dressing change. Wound Cleanser Discharge Instruction: Cleanse the wound with wound cleanser prior to applying a clean dressing using gauze sponges, not tissue or cotton balls. Peri-Wound Care Sween Lotion (Moisturizing lotion) Discharge Instruction: Apply moisturizing lotion as directed Topical Primary Dressing KerraCel Ag Gelling Fiber Dressing, 2x2 in (silver alginate) Discharge Instruction: Apply silver alginate to wound bed as instructed Secondary Dressing ABD Pad, 5x9 Discharge Instruction: Apply over primary dressing as directed. Woven Gauze Sponge, Non-Sterile 4x4 in Discharge Instruction: Apply over primary dressing as directed. Secured With Transpore Surgical Tape, 2x10 (in/yd) Discharge Instruction: Secure dressing with tape as directed. Compression Wrap Kerlix Roll 4.5x3.1 (in/yd) Discharge Instruction: Apply Kerlix and Coban compression as directed. Coban Self-Adherent Wrap 4x5 (in/yd) Discharge Instruction: Apply over Kerlix as directed. Compression Stockings Add-Ons Electronic Signature(s) Signed: 09/29/2022 3:23:38 PM By: Fredirick Maudlin MD FACS Entered By: Fredirick Maudlin on 09/29/2022 15:23:37 -------------------------------------------------------------------------------- [place]  Details Patient Name: Date of Service: [name], [name] 09/29/2022 2:30 PM Medical Record Number: [id number] Patient Account Number: [id number] Date of Birth/Sex: Treating RN: Dec 06, 1946 (76 y.o. Marta Lamas Primary Care [name] [name]: Other Clinician: [name] Referring Maurisa Tesmer: Treating Izaiah Tabb/Extender: [name] in Treatment: 1 Active Inactive Peripheral Neuropathy Nursing Diagnoses: Potential alteration in peripheral tissue perfusion (select prior to confirmation of diagnosis) Goals: Patient/caregiver  will verbalize understanding of disease process and disease management Date Initiated: 09/20/2022 Target Resolution Date: 10/18/2022 Goal Status: Active Interventions: Provide education on Management of Neuropathy and Related Ulcers Treatment Activities: Patient referred for customized footwear/offloading : 09/20/2022 Notes: Wound/Skin Impairment Nursing Diagnoses: Knowledge deficit related to ulceration/compromised skin integrity Goals: Ulcer/skin breakdown will have a volume reduction of 30% by week 4 Date Initiated: 09/20/2022 Target Resolution Date: 10/18/2022 Goal Status: Active Interventions: Assess ulceration(s) every visit Treatment Activities: Skin care regimen initiated : 09/20/2022 Topical wound management initiated : 09/20/2022 Notes: Electronic Signature(s) Signed: 09/29/2022 5:02:24 PM By: Blanche East RN Entered By: Blanche East on 09/29/2022 14:59:32 -------------------------------------------------------------------------------- Pain Assessment Details Patient Name: Date of Service: Sherry Masson [name] 09/29/2022 2:30 PM Medical Record Number: [id number] Patient Account Number: [id number] Date of Birth/Sex: Treating RN: 21-Jan-1946 (76 y.o. Marta Lamas Primary Care Chriss Mannan: [name] Other Clinician: Referring Tanekia Ryans: Treating Nakai Yard/Extender: [name] in Treatment: 1 Active Problems Location of Pain Severity and Description of Pain Patient Has Paino Yes Site Locations Pain Location: Pain Location: Pain in Ulcers Rate the pain. Current Pain Level: 10 Pain Management and Medication Current Pain Management: Electronic Signature(s) Signed: 09/29/2022 5:02:24 PM By: Blanche East RN Entered By: Blanche East on 09/29/2022 14:40:51 -------------------------------------------------------------------------------- Patient/Caregiver Education Details Patient Name: Date of Service: Alroy Dust  10/6/2023andnbsp2:30 PM Medical Record Number: [id number] Patient Account Number: [id number] Date of Birth/Gender: Treating RN: August 03, 1946 (76 y.o. Marta Lamas Primary Care Physician: [name] Other Clinician: Referring Physician: Treating Physician/Extender: [name] in Treatment: 1 Education Assessment Education Provided To: Patient Education Topics Provided Peripheral Neuropathy: Methods: Explain/Verbal Responses: Reinforcements needed, State content correctly Electronic Signature(s) Signed: 09/29/2022 5:02:24 PM By: Blanche East RN Entered By: Blanche East on 09/29/2022 14:50:54 -------------------------------------------------------------------------------- Wound Assessment Details Patient Name: Date of Service: Sherry Masson [name] 09/29/2022 2:30 PM Medical Record Number: [id number] Patient Account Number: [id number] Date of Birth/Sex: Treating RN: 06-22-1946 (76 y.o. [name], Blanchard Primary Care Montavius Subramaniam: [name] Other Clinician: Referring Bowie Doiron: Treating Milania Haubner/Extender: [name] in Treatment: 1 Wound Status Wound Number: 1 Primary Lymphedema Etiology: Wound Location: Right, Dorsal Foot Wound Open Wounding Event: Blister Status: Date Acquired: 08/23/2022 Comorbid Asthma, Chronic Obstructive Pulmonary Disease (COPD), Weeks Of Treatment: 1 History: Congestive Heart Failure, Hypertension, Rheumatoid Arthritis, Clustered Wound: No Neuropathy, Received Chemotherapy Photos Wound Measurements Length: (cm) 1.1 Width: (cm) 1.1 Depth: (cm) 0.6 Area: (cm) 0.95 Volume: (cm) 0.57 % Reduction in Area: 8.4% % Reduction in Volume: 8.4% Epithelialization: None Tunneling: Yes Position (o'clock): 12 Maximum Distance: (cm) 2.4 Undermining: Yes Starting Position (o'clock): 12 Ending Position (o'clock): 12 Maximum Distance: (cm) 0.4 Wound Description Classification: Full Thickness  Without Exposed Support Structures Wound Margin: Distinct, outline attached Exudate Amount: Medium Exudate Type: Serosanguineous Exudate Color: red, brown Foul Odor After Cleansing: No Slough/Fibrino Yes Wound Bed Granulation Amount: Small (1-33%) Exposed Structure Granulation Quality: Red Fascia Exposed: No Necrotic Amount: Large (67-100%) Fat Layer (Subcutaneous Tissue) Exposed: Yes Necrotic Quality: Eschar, Adherent Slough Tendon Exposed:  No Muscle Exposed: No Joint Exposed: No Periwound Skin Texture Texture Color No Abnormalities Noted: No No Abnormalities Noted: No Moisture No Abnormalities Noted: No Dry / Scaly: Yes Maceration: Yes Treatment Notes Wound #1 (Foot) Wound Laterality: Dorsal, Right Cleanser Soap and Water Discharge Instruction: May shower and wash wound with dial antibacterial soap and water prior to dressing change. Wound Cleanser Discharge Instruction: Cleanse the wound with wound cleanser prior to applying a clean dressing using gauze sponges, not tissue or cotton balls. Peri-Wound Care Sween Lotion (Moisturizing lotion) Discharge Instruction: Apply moisturizing lotion as directed Topical Primary Dressing Iodoform packing strip 1/2 (in) Discharge Instruction: Lightly pack as instructed Secondary Dressing ABD Pad, 5x9 Discharge Instruction: Apply over primary dressing as directed. Zetuvit Plus 4x8 in Discharge Instruction: Apply over primary dressing as directed. Secured With Transpore Surgical Tape, 2x10 (in/yd) Discharge Instruction: Secure dressing with tape as directed. Compression Wrap Kerlix Roll 4.5x3.1 (in/yd) Discharge Instruction: Apply Kerlix and Coban compression as directed. Coban Self-Adherent Wrap 4x5 (in/yd) Discharge Instruction: Apply over Kerlix as directed. Compression Stockings Add-Ons Electronic Signature(s) Signed: 09/29/2022 5:02:24 PM By: Blanche East RN Entered By: Blanche East on 09/29/2022  14:47:26 -------------------------------------------------------------------------------- Wound Assessment Details Patient Name: Date of Service: [name], Sherry [name] 09/29/2022 2:30 PM Medical Record Number: [id number] Patient Account Number: [id number] Date of Birth/Sex: Treating RN: December 23, 1946 (76 y.o. [name], [name] Primary Care Nahomi Hegner: [name] Other Clinician: Referring Yaritzy Huser: Treating Otila Starn/Extender: [name] in Treatment: 1 Wound Status Wound Number: 2 Primary Abrasion Etiology: Wound Location: Right, Lateral Lower Leg Wound Open Wounding Event: Skin Tear/Laceration Status: Date Acquired: 09/20/2022 Comorbid Asthma, Chronic Obstructive Pulmonary Disease (COPD), Weeks Of Treatment: 1 History: Congestive Heart Failure, Hypertension, Rheumatoid Arthritis, Clustered Wound: No Neuropathy, Received Chemotherapy Photos Wound Measurements Length: (cm) 1.5 Width: (cm) 0.5 Depth: (cm) 0.1 Area: (cm) 0.589 Volume: (cm) 0.059 % Reduction in Area: 16.7% % Reduction in Volume: 16.9% Epithelialization: None Tunneling: No Undermining: No Wound Description Classification: Full Thickness Without Exposed Support Structures Wound Margin: Distinct, outline attached Exudate Amount: Medium Exudate Type: Serosanguineous Exudate Color: red, brown Foul Odor After Cleansing: No Slough/Fibrino Yes Wound Bed Granulation Amount: Medium (34-66%) Exposed Structure Granulation Quality: Red Fascia Exposed: No Necrotic Amount: Medium (34-66%) Fat Layer (Subcutaneous Tissue) Exposed: Yes Necrotic Quality: Eschar, Adherent Slough Tendon Exposed: No Muscle Exposed: No Joint Exposed: No Bone Exposed: No Periwound Skin Texture Texture Color No Abnormalities Noted: No No Abnormalities Noted: No Moisture No Abnormalities Noted: No Dry / Scaly: Yes Treatment Notes Wound #2 (Lower Leg) Wound Laterality: Right, Lateral Cleanser Soap and  Water Discharge Instruction: May shower and wash wound with dial antibacterial soap and water prior to dressing change. Wound Cleanser Discharge Instruction: Cleanse the wound with wound cleanser prior to applying a clean dressing using gauze sponges, not tissue or cotton balls. Peri-Wound Care Sween Lotion (Moisturizing lotion) Discharge Instruction: Apply moisturizing lotion as directed Topical Primary Dressing KerraCel Ag Gelling Fiber Dressing, 2x2 in (silver alginate) Discharge Instruction: Apply silver alginate to wound bed as instructed Secondary Dressing ABD Pad, 5x9 Discharge Instruction: Apply over primary dressing as directed. Woven Gauze Sponge, Non-Sterile 4x4 in Discharge Instruction: Apply over primary dressing as directed. Secured With Transpore Surgical Tape, 2x10 (in/yd) Discharge Instruction: Secure dressing with tape as directed. Compression Wrap Kerlix Roll 4.5x3.1 (in/yd) Discharge Instruction: Apply Kerlix and Coban compression as directed. Coban Self-Adherent Wrap 4x5 (in/yd) Discharge Instruction: Apply over Kerlix as directed. Compression Stockings Add-Ons Electronic Signature(s) Signed: 09/29/2022 5:02:24 PM  By: Blanche East RN Entered By: Blanche East on 09/29/2022 14:48:08 -------------------------------------------------------------------------------- [place] Details Patient Name: Date of Service: Sherry Masson [name] 09/29/2022 2:30 PM Medical Record Number: [id number] Patient Account Number: [id number] Date of Birth/Sex: Treating RN: 1946/02/02 (76 y.o. [name], [name] Primary Care Zola Runion: [name] Other Clinician: Referring Dmani Mizer: Treating Lanier Felty/Extender: [name] in Treatment: 1 Vital Signs Time Taken: 14:35 Temperature (F): 98.7 Height (in): 64 Pulse (bpm): 98 Weight (lbs): 111 Respiratory Rate (breaths/min): 18 Body Mass Index (BMI): 19.1 Blood Pressure (mmHg): 134/75 Reference  Range: 80 - 120 mg / dl Electronic Signature(s) Signed: 09/29/2022 5:02:24 PM By: Blanche East RN Entered By: Blanche East on 09/29/2022 15:22:21

## 2022-10-02 NOTE — Progress Notes (Signed)
Sherry, [name] ([id number]) [id number] Page 1 of 10 Visit Report for 09/29/2022 Chief Complaint Document Details Patient Name: Date of Service: [name], [name] 09/29/2022 2:30 PM Medical Record Number: [id number] Patient Account Number: [id number] Date of Birth/Sex: Treating RN: 02-Dec-1946 (76 y.o. F) Primary Care Provider: [name] Other Clinician: Referring Provider: Treating Provider/Extender: [name] in Treatment: 1 Information Obtained from: Patient Chief Complaint Patient seen for complaints of Non-Healing Wounds. Electronic Signature(s) Signed: 09/29/2022 3:23:43 PM By: Fredirick Maudlin MD FACS Entered By: Fredirick Maudlin on 09/29/2022 15:23:43 -------------------------------------------------------------------------------- Debridement Details Patient Name: Date of Service: [name] [name] 09/29/2022 2:30 PM Medical Record Number: [id number] Patient Account Number: [id number] Date of Birth/Sex: Treating RN: 02/20/1946 (76 y.o. Iver [name], Sherry Primary Care Provider: [name] Other Clinician: Referring Provider: Treating Provider/Extender: [name] in Treatment: 1 Debridement Performed for Assessment: Wound #1 Right,Dorsal Foot Performed By: Physician Fredirick Maudlin, MD Debridement Type: Debridement Level of Consciousness (Pre-procedure): Awake and Alert Pre-procedure Verification/Time Out Yes - 14:56 Taken: Start Time: 01:57 Pain Control: Lidocaine 4% T opical Solution T Area Debrided (L x W): otal 1.1 (cm) x 1.1 (cm) = 1.21 (cm) Tissue and other material debrided: Viable, Non-Viable, Slough, Subcutaneous, Slough Level: Skin/Subcutaneous Tissue Debridement Description: Excisional Instrument: Curette Bleeding: Minimum Hemostasis Achieved: Pressure Procedural Pain: 0 Post Procedural Pain: 0 Response to Treatment: Procedure was tolerated well Level of  Consciousness (Post- Awake and Alert procedure): Post Debridement Measurements of Total Wound Length: (cm) 1.1 Width: (cm) 1.1 Depth: (cm) 0.6 Volume: (cm) 0.57 Character of Wound/Ulcer Post Debridement: Requires Further Debridement Post Procedure Diagnosis Same as Pre-procedure Zenola, [name] ([id number]) [phone] Page 2 of 10 Notes Scribed for Dr. [name] by Blanche East, RN Electronic Signature(s) Signed: 09/29/2022 4:42:57 PM By: Fredirick Maudlin MD FACS Signed: 09/29/2022 5:02:24 PM By: Blanche East RN Entered By: Blanche East on 09/29/2022 14:58:54 -------------------------------------------------------------------------------- HPI Details Patient Name: Date of Service: [name] [name] 09/29/2022 2:30 PM Medical Record Number: [id number] Patient Account Number: [id number] Date of Birth/Sex: Treating RN: 07/25/1946 (76 y.o. F) Primary Care Provider: [name] Other Clinician: Referring Provider: Treating Provider/Extender: [name] in Treatment: 1 History of Present Illness HPI Description: ADMISSION 09/20/2022 This is a 76 year old woman with a past medical history significant for congestive heart failure, atrial fibrillation, rheumatoid arthritis on long-term steroid treatment, CMML currently receiving chemotherapy and moderate malnutrition. She presents to clinic today with a wound on the dorsum of her right foot. She says it started out as a bruise and then developed a bump which subsequently broke open. She says she was sent to our clinic by her oncologist. She is not diabetic and does not smoke. ABI in clinic today was 1.55. She has had formal segmental arterial Dopplers done, a little over a year ago, which did not show any evidence of occlusive vascular disease. On the dorsal aspect of the right foot, there is a wound with a lot of old hematoma, slough, eschar, and and nonviable subcutaneous  tissue. T the best of my o ability to discern, it does appear to involve the muscle layer, but there is no evidence of necrosis. The wound is pouring serous fluid; the patient has 3+ pitting edema to the bilateral lower extremities. 09/29/2022: The patient came to clinic today with nothing but a T overlying her wound. She is accompanied by her husband today. She has not elfa communicated with her cardiologist regarding her fluid  overload. She still has 3+ pitting edema bilaterally. The wound is still pouring serous fluid, although not quite as effusively as at her initial visit. There is still quite a bit of nonviable tissue and hematoma present. Electronic Signature(s) Signed: 09/29/2022 3:25:31 PM By: Fredirick Maudlin MD FACS Entered By: Fredirick Maudlin on 09/29/2022 15:25:31 -------------------------------------------------------------------------------- Physical Exam Details Patient Name: Date of Service: [name] [name] 09/29/2022 2:30 PM Medical Record Number: [id number] Patient Account Number: [id number] Date of Birth/Sex: Treating RN: April 01, 1946 (76 y.o. F) Primary Care Provider: [name] Other Clinician: Referring Provider: Treating Provider/Extender: [name] in Treatment: 1 Constitutional . . . . No acute distress.Marland Kitchen Respiratory Normal work of breathing on room air.. Notes 09/29/2022: The wound is still pouring serous fluid, although not quite as effusively as at her initial visit. There is still quite a bit of nonviable tissue and hematoma present. [name], [name] ([id number]) [id number] Page 3 of 10 Electronic Signature(s) Signed: 09/29/2022 3:27:32 PM By: Fredirick Maudlin MD FACS Entered By: Fredirick Maudlin on 09/29/2022 15:27:32 -------------------------------------------------------------------------------- Physician Orders Details Patient Name: Date of Service: [name] [name] 09/29/2022 2:30  PM Medical Record Number: [id number] Patient Account Number: [id number] Date of Birth/Sex: Treating RN: 1945-12-29 (76 y.o. 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 - Dr. [name] Rm 2 Return A Friday Nurse Visit: - Nurse visit rm 3 Tuesday 10/03/22 at 2:00PM Anesthetic Wound #1 Right,Dorsal Foot (In clinic) Topical Lidocaine 4% applied to wound bed - in clinic, prior to debridement Bathing/ Shower/ Hygiene May shower with protection but do not get wound dressing(s) wet. Edema Control - Lymphedema / SCD / Other Left Lower Extremity Elevate legs to the level of the heart or above for 30 minutes daily and/or when sitting, a frequency of: Avoid standing for long periods of time. Patient to wear own compression stockings every day. Compression stocking or Garment 20-30 mm/Hg pressure to: - left leg Home Health Admit to Home Health for wound care. May utilize formulary equivalent dressing for wound treatment orders unless otherwise specified. New wound care orders this week; continue Home Health for wound care. May utilize formulary equivalent dressing for wound treatment orders unless otherwise specified. Wound Treatment Wound #1 - Foot Wound Laterality: Dorsal, Right Cleanser: Soap and  Water 1 x Per Day/30 Days Discharge Instructions: May shower and wash wound with dial antibacterial soap and water prior to dressing change. Cleanser: Wound Cleanser 1 x Per Day/30 Days Discharge Instructions: Cleanse the wound with wound cleanser prior to applying a clean dressing using gauze sponges, not tissue or cotton balls. Peri-Wound Care: Sween Lotion (Moisturizing lotion) 1 x Per Day/30 Days Discharge Instructions: Apply moisturizing lotion as directed Prim Dressing: Iodoform packing strip 1/2 (in) (Dispense As Written) 1 x Per Day/30 Days ary Discharge Instructions: Lightly pack as instructed Secondary Dressing: ABD Pad, 5x9 (Generic) 1 x Per Day/30 Days Discharge Instructions: Apply over primary dressing as directed. Roupp, Lalita H (  [id number]) [id number] Page 4 of 10 Secondary Dressing: Zetuvit Plus 4x8 in (Generic) 1 x Per Day/30 Days Discharge Instructions: Apply over primary dressing as directed. Secured With: Transpore Surgical Tape, 2x10 (in/yd) (Generic) 1 x Per Day/30 Days Discharge Instructions: Secure dressing with tape as directed. Compression Wrap: Kerlix Roll 4.5x3.1 (in/yd) (Generic) 1 x Per Day/30 Days Discharge Instructions: Apply Kerlix and Coban compression as directed. Compression Wrap: Coban Self-Adherent Wrap 4x5 (in/yd) (Generic) 1 x Per Day/30 Days Discharge Instructions: Apply over Kerlix as directed. Wound #2 - Lower Leg Wound Laterality: Right, Lateral Cleanser: Soap and Water 1 x Per Day/30 Days Discharge Instructions: May shower and wash wound with dial antibacterial soap and water prior to dressing change. Cleanser: Wound Cleanser 1 x Per Day/30 Days Discharge Instructions: Cleanse the wound with wound cleanser prior to applying a clean dressing using gauze sponges, not tissue or cotton balls. Peri-Wound Care: Sween Lotion (Moisturizing lotion) 1 x Per Day/30 Days Discharge Instructions: Apply moisturizing lotion as  directed Prim Dressing: KerraCel Ag Gelling Fiber Dressing, 2x2 in (silver alginate) (Generic) 1 x Per Day/30 Days ary Discharge Instructions: Apply silver alginate to wound bed as instructed Secondary Dressing: ABD Pad, 5x9 (Generic) 1 x Per Day/30 Days Discharge Instructions: Apply over primary dressing as directed. Secondary Dressing: Woven Gauze Sponge, Non-Sterile 4x4 in (Generic) 1 x Per Day/30 Days Discharge Instructions: Apply over primary dressing as directed. Secured With: Transpore Surgical Tape, 2x10 (in/yd) 1 x Per Day/30 Days Discharge Instructions: Secure dressing with tape as directed. Compression Wrap: Kerlix Roll 4.5x3.1 (in/yd) (Generic) 1 x Per Day/30 Days Discharge Instructions: Apply Kerlix and Coban compression as directed. Compression Wrap: Coban Self-Adherent Wrap 4x5 (in/yd) 1 x Per Day/30 Days Discharge Instructions: Apply over Kerlix as directed. Electronic Signature(s) Signed: 09/29/2022 5:02:24 PM By: Blanche East RN Signed: 10/02/2022 10:14:13 AM By: Fredirick Maudlin MD FACS Previous Signature: 09/29/2022 4:42:57 PM Version By: Fredirick Maudlin MD FACS Entered By: Blanche East on 09/29/2022 16:49:25 -------------------------------------------------------------------------------- Problem List Details Patient Name: Date of Service: [name], [name] [name] 09/29/2022 2:30 PM Medical Record Number: [id number] Patient Account Number: [id number] Date of Birth/Sex: Treating RN: January 21, 1946 (76 y.o. 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 09/20/2022 No Yes without evidence of necrosis YOCELYN, BROCIOUS ([id number]) [phone].pdf Page 5 of  10 [phone] Non-pressure chronic ulcer of other part of right lower leg with fat layer 09/20/2022 No Yes exposed U63.33 Chronic systolic (congestive) heart failure 09/20/2022 No Yes M06.9 Rheumatoid arthritis, unspecified 09/20/2022 No Yes Z79.52 Long term (current) use of systemic steroids 09/20/2022 No Yes I70.90 Unspecified atherosclerosis 09/20/2022 No Yes 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 09/29/2022 2:30 PM Medical Record Number: [id number] Patient Account Number: [id number] Date of Birth/Sex: Treating RN: March 25, 1946 (76 y.o. F) Primary Care Provider: [name] Other Clinician: Referring Provider: Treating Provider/Extender: [name] in Treatment: 1 Subjective Chief Complaint Information obtained from Patient Patient seen for complaints of Non-Healing Wounds. History of Present Illness (HPI) ADMISSION 09/20/2022 This is a 76 year old woman with a past medical  history significant for congestive heart failure, atrial fibrillation, rheumatoid arthritis on long-term steroid treatment, CMML currently receiving chemotherapy and moderate malnutrition. She presents to clinic today with a wound on the dorsum of her right foot. She says it started out as a bruise and then developed a bump which subsequently broke open. She says she was sent to our clinic by her oncologist. She is not diabetic and does not smoke. ABI in clinic today was 1.55. She has had formal segmental arterial Dopplers done, a little over a year ago, which did not show any evidence of occlusive vascular disease. On the  dorsal aspect of the right foot, there is a wound with a lot of old hematoma, slough, eschar, and and nonviable subcutaneous tissue. T the best of my o ability to discern, it does appear to involve the muscle layer, but there is no evidence of necrosis. The wound is pouring serous fluid; the patient has 3+ pitting edema to the bilateral lower extremities. 09/29/2022: The patient came to clinic today with nothing but a T overlying her wound. She is accompanied by her husband today. She has not elfa communicated with her cardiologist regarding her fluid overload. She still has 3+ pitting edema bilaterally. The wound is still pouring serous fluid, although not quite as effusively as at her initial visit. There is still quite a bit of nonviable tissue and hematoma present. Patient History Information obtained from Patient. LILIE, VEZINA ([id number]) [id number] Page 6 of 10 Family History Unknown History, Diabetes - Father, Heart Disease - Father, No family history of Cancer. Social History Former smoker - quit 2003, Marital Status - Married, Alcohol Use - Daily, Drug Use - Prior History, Caffeine Use - Daily. Medical History Eyes Denies history of Cataracts, Glaucoma, Optic Neuritis Ear/Nose/Mouth/Throat Denies history of Chronic sinus problems/congestion, Middle ear problems Respiratory Patient has history of Asthma, Chronic Obstructive Pulmonary Disease (COPD) Cardiovascular Patient has history of Congestive Heart Failure, Hypertension Denies history of Coronary Artery Disease, Deep Vein Thrombosis, Hypotension, Myocardial Infarction Endocrine Denies history of Type I Diabetes, Type II Diabetes Immunological Denies history of Lupus Erythematosus, Raynaudoos, Scleroderma Integumentary (Skin) Denies history of History of Burn Musculoskeletal Patient has history of Rheumatoid Arthritis Denies history of Gout Neurologic Patient has history of  Neuropathy Oncologic Patient has history of Received Chemotherapy - currently receiving Hospitalization/Surgery History - reverse shoulder arthroplasty Rt- 2018. Medical A Surgical History Notes nd Cardiovascular AFIB Genitourinary Stage 3 CKD Objective Constitutional No acute distress.. Vitals Time Taken: 2:35 PM, Height: 64 in, Weight: 111 lbs, BMI: 19.1, Temperature: 98.7 F, Pulse: 98 bpm, Respiratory Rate: 18 breaths/min, Blood Pressure: 134/75 mmHg. Respiratory Normal work of breathing on room air.. General Notes: 09/29/2022: The wound is still pouring serous fluid, although not quite as effusively as at her initial visit. There is still quite a bit of nonviable tissue and hematoma present. Integumentary (Hair, Skin) Wound #1 status is Open. Original cause of wound was Blister. The date acquired was: 08/23/2022. The wound has been in treatment 1 weeks. The wound is located on the Right,Dorsal Foot. The wound measures 1.1cm length x 1.1cm width x 0.6cm depth; 0.95cm^2 area and 0.57cm^3 volume. There is Fat Layer (Subcutaneous Tissue) exposed. Tunneling has been noted at 12:00 with a maximum distance of 2.4cm. Undermining begins at 12:00 and ends at 12:00 with a maximum distance of 0.4cm. There is a medium amount of serosanguineous drainage noted. The wound margin is distinct with the outline attached to the wound base. There is  small (1-33%) red granulation within the wound bed. There is a large (67-100%) amount of necrotic tissue within the wound bed including Eschar and Adherent Slough. The periwound skin appearance exhibited: Dry/Scaly, Maceration. Wound #2 status is Open. Original cause of wound was Skin T ear/Laceration. The date acquired was: 09/20/2022. The wound has been in treatment 1 weeks. The wound is located on the Right,Lateral Lower Leg. The wound measures 1.5cm length x 0.5cm width x 0.1cm depth; 0.589cm^2 area and 0.059cm^3 volume. There is Fat Layer (Subcutaneous  Tissue) exposed. There is no tunneling or undermining noted. There is a medium amount of serosanguineous drainage noted. The wound margin is distinct with the outline attached to the wound base. There is medium (34-66%) red granulation within the wound bed. There is a medium (34-66%) amount of necrotic tissue within the wound bed including Eschar and Adherent Slough. The periwound skin appearance exhibited: Dry/Scaly. 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 There was a Excisional Skin/Subcutaneous Tissue Debridement with a total area of 1.21 sq cm performed by Fredirick Maudlin, MD. With the following instrument(s): Curette to remove Viable and Non-Viable tissue/material. Material removed includes Subcutaneous Tissue and Slough and after achieving pain control using Lidocaine 4% T opical Solution. No specimens were taken. A time out was conducted at 14:56, prior to the start of the procedure. A Minimum amount of bleeding was controlled with Pressure. The procedure was tolerated well with a pain level of 0 throughout and a pain level of 0 following the procedure. Post Debridement Measurements: 1.1cm length x 1.1cm width x 0.6cm depth; 0.57cm^3 volume. Character of Wound/Ulcer Post Debridement requires further debridement. Post procedure Diagnosis Wound #1: Same as Pre-Procedure General Notes: Scribed for Dr. [name] by  Blanche East, RN. Plan Follow-up Appointments: Return Appointment in 1 week. - Dr. [name] Rm 2 Friday Nurse Visit: - Nurse visit rm 3 Tuesday 10/03/22 at 2:00PM Anesthetic: Wound #1 Right,Dorsal Foot: (In clinic) Topical Lidocaine 4% applied to wound bed - in clinic, prior to debridement Bathing/ Shower/ Hygiene: May shower with protection but do not get wound dressing(s) wet. Edema Control - Lymphedema / SCD / Other: Elevate legs to the level of the heart or above for 30 minutes daily and/or when sitting, a frequency of: Avoid standing for long periods of time. Patient to wear own compression stockings every day. Compression stocking or Garment 20-30 mm/Hg pressure to: - left leg Home Health: Admit to [facility] for wound care. May utilize formulary equivalent dressing for wound treatment orders unless otherwise specified. New wound care orders this week; continue Home Health for wound care. May utilize formulary equivalent dressing for wound treatment orders unless otherwise specified. WOUND #1: - Foot Wound Laterality: Dorsal, Right Cleanser: Soap and Water 1 x Per Day/30 Days Discharge Instructions: May shower and wash wound with dial antibacterial soap and water prior to dressing change. Cleanser: Wound Cleanser 1 x Per Day/30 Days Discharge Instructions: Cleanse the wound with wound cleanser prior to applying a clean dressing using gauze sponges, not tissue or cotton balls. Peri-Wound Care:  Sween Lotion (Moisturizing lotion) 1 x Per Day/30 Days Discharge Instructions: Apply moisturizing lotion as directed Prim Dressing: Iodoform packing strip 1/2 (in) (Dispense As Written) 1 x Per Day/30 Days ary Discharge Instructions: Lightly pack as instructed Secondary Dressing: ABD Pad, 5x9 (Generic) 1 x Per Day/30 Days Discharge Instructions: Apply over primary dressing as directed. Secondary Dressing: Zetuvit Plus 4x8 in (Generic) 1 x Per Day/30 Days Discharge Instructions: Apply over  primary dressing as directed. Secured With: Transpore Surgical T ape, 2x10 (in/yd) (Generic) 1 x Per Day/30 Days Discharge Instructions: Secure dressing with tape as directed. Com pression Wrap: Kerlix Roll 4.5x3.1 (in/yd) (Generic) 1 x Per Day/30 Days Discharge Instructions: Apply Kerlix and Coban compression as directed. Com pression Wrap: Coban Self-Adherent Wrap 4x5 (in/yd) (Generic) 1 x Per Day/30 Days Discharge Instructions: Apply over Kerlix as directed. WOUND #2: - Lower Leg Wound Laterality: Right, Lateral Cleanser: Soap and Water 1 x Per Day/30 Days Discharge Instructions: May shower and wash wound with dial antibacterial soap and water prior to dressing change. Cleanser: Wound Cleanser 1 x Per Day/30 Days Discharge Instructions: Cleanse the wound with wound cleanser prior to applying a clean dressing using gauze sponges, not tissue or cotton balls. Peri-Wound Care: Sween Lotion (Moisturizing lotion) 1 x Per Day/30 Days Discharge Instructions: Apply moisturizing lotion as directed Prim Dressing: KerraCel Ag Gelling Fiber Dressing, 2x2 in (silver alginate) (Generic) 1 x Per Day/30 Days ary Discharge Instructions: Apply silver alginate to wound bed as instructed Secondary Dressing: ABD Pad, 5x9 (Generic) 1 x Per Day/30 Days Discharge Instructions: Apply over primary dressing as directed. Secondary Dressing: Woven Gauze Sponge, Non-Sterile 4x4 in (Generic) 1 x Per Day/30 Days Discharge Instructions: Apply over primary dressing as directed. Secured With: Transpore Surgical T ape, 2x10 (in/yd) 1 x Per Day/30 Days Discharge Instructions: Secure dressing with tape as directed. Com pression Wrap: Kerlix Roll 4.5x3.1 (in/yd) (Generic) 1 x Per Day/30 Days Discharge Instructions: Apply Kerlix and Coban compression as directed. Com pression Wrap: Coban Self-Adherent Wrap 4x5 (in/yd) 1 x Per Day/30 Days Discharge Instructions: Apply over Kerlix as directed. 09/29/2022: The wound is still  pouring serous fluid, although not quite as effusively as at her initial visit. There is still quite a bit of nonviable tissue and hematoma present. RALPH, BENAVIDEZ ([id number]) [id number] Page 8 of 10 I used a curette to debride slough and nonviable subcutaneous tissue from her wound. I had a frank discussion with the patient and her husband that her wound is not going to progress and less her fluid status is properly addressed. Her husband indicated that he would contact the patient's cardiologist regarding this. We will continue to pack the site with iodoform packing strips and use multiple absorbent layers to minimize tissue maceration from the tremendous amount of drainage she has. I think her entire leg needs to be wrapped, but the patient and her husband say she does not tolerate compression well so we will try Kerlix and Ace bandage wrapping. She needs to be wearing compression stockings but again says she cannot tolerate them so I recommended they wrap her left leg with an Ace bandage, as well. Reinforced the need to keep her legs elevated. She will have a nurse visit early in the week for a dressing change and then see me in 1 week's time. Electronic Signature(s) Signed: 09/29/2022 5:02:24 PM By: Blanche East RN Signed: 10/02/2022 10:14:13 AM By: Fredirick Maudlin MD FACS Previous Signature: 09/29/2022 3:30:37 PM Version By: Fredirick Maudlin MD FACS Entered By:  Blanche East on 09/29/2022 16:49:42 -------------------------------------------------------------------------------- HxROS Details Patient Name: Date of Service: [name], [name] 09/29/2022 2:30 PM Medical Record Number: [id number] Patient Account Number: [id number] Date of Birth/Sex: Treating RN: Nov 20, 1946 (76 y.o. F) Primary Care Provider: [name] Other Clinician: Referring Provider: Treating Provider/Extender: [name] in Treatment: 1 Information  Obtained From Patient Eyes Medical History: Negative for: Cataracts; Glaucoma; Optic Neuritis Ear/Nose/Mouth/Throat Medical History: Negative for: Chronic sinus problems/congestion; Middle ear problems Respiratory Medical History: Positive for: Asthma; Chronic Obstructive Pulmonary Disease (COPD) Cardiovascular Medical History: Positive for: Congestive Heart Failure; Hypertension Negative for: Coronary Artery Disease; Deep Vein Thrombosis; Hypotension; Myocardial Infarction Past Medical History Notes: AFIB Endocrine Medical History: Negative for: Type I Diabetes; Type II Diabetes Genitourinary Medical History: Past Medical History Notes: Stage 3 CKD Immunological Medical History: Negative for: Lupus Erythematosus; Raynauds; Scleroderma Integumentary (Skin) Medical HistoryCHERIKA, [name] ([id number]) [id number] Page 9 of 10 Negative for: History of Burn Musculoskeletal Medical History: Positive for: Rheumatoid Arthritis Negative for: Gout Neurologic Medical History: Positive for: Neuropathy Oncologic Medical History: Positive for: Received Chemotherapy - currently receiving Immunizations Pneumococcal Vaccine: Received Pneumococcal Vaccination: Yes Received Pneumococcal Vaccination On or After 60th Birthday: Yes Implantable Devices None Hospitalization / Surgery History Type of Hospitalization/Surgery reverse shoulder arthroplasty Rt- 2018 Family and Social History Unknown History: Yes; Cancer: No; Diabetes: Yes - Father; Heart Disease: Yes - Father; Former smoker - quit 2003; Marital Status - Married; Alcohol Use: Daily; Drug Use: Prior History; Caffeine Use: Daily; Financial Concerns: No; Food, Clothing or Shelter Needs: No; Support System Lacking: No; Transportation Concerns: No Electronic Signature(s) Signed: 09/29/2022 4:42:57 PM By: Fredirick Maudlin MD FACS Entered By: Fredirick Maudlin on 09/29/2022  15:25:36 -------------------------------------------------------------------------------- SuperBill Details Patient Name: Date of Service: [name] RO Ovidio Hanger 09/29/2022 Medical Record Number: [id number] Patient Account Number: [id number] Date of Birth/Sex: Treating RN: August 08, 1946 (76 y.o. 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 Facility Procedures : JENNAVIEVE, ARRICK Code: [id number] HARON H [id number] ICD L9 Description: 11042 - DEB SUBQ TISSUE 20 SQ CM/< 353) [phone] -10 Diagnosis Description 7.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 withou Modifier: [id number].pd t evidence of necr Quantity: 1 f Page 10 of 10 osis Physician Procedures : CPT4 Code Description Modifier [id number] 41740 - WC PHYS LEVEL 3 - EST PT 25 ICD-10 Diagnosis Description 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

## 2022-10-03 ENCOUNTER — Encounter (HOSPITAL_BASED_OUTPATIENT_CLINIC_OR_DEPARTMENT_OTHER): Payer: Medicare Other | Admitting: General Surgery

## 2022-10-03 DIAGNOSIS — L97812 Non-pressure chronic ulcer of other part of right lower leg with fat layer exposed: Secondary | ICD-10-CM | POA: Diagnosis not present

## 2022-10-03 DIAGNOSIS — E1122 Type 2 diabetes mellitus with diabetic chronic kidney disease: Secondary | ICD-10-CM | POA: Diagnosis not present

## 2022-10-03 DIAGNOSIS — M069 Rheumatoid arthritis, unspecified: Secondary | ICD-10-CM | POA: Diagnosis not present

## 2022-10-03 DIAGNOSIS — L97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 evidence of necrosis: Secondary | ICD-10-CM | POA: Diagnosis not present

## 2022-10-03 DIAGNOSIS — E11621 Type 2 diabetes mellitus with foot ulcer: Secondary | ICD-10-CM | POA: Diagnosis not present

## 2022-10-03 DIAGNOSIS — E11622 Type 2 diabetes mellitus with other skin ulcer: Secondary | ICD-10-CM | POA: Diagnosis not present

## 2022-10-03 NOTE — Progress Notes (Signed)
[name], Sherry ([id number]) [id number] Page 1 of 7 Visit Report for 10/03/2022 Arrival Information Details Patient Name: Date of Service: [name], [name] 10/03/2022 2:00 PM Medical Record Number: [id number] Patient Account Number: [id number] Date of Birth/Sex: Treating RN: 09-Apr-1946 (76 y.o. [name] Primary Care Kimyata Milich: [name] Other Clinician: Referring Jeston Junkins: Treating Marykathleen Russi/Extender: [name] in Treatment: 1 Visit Information History Since Last Visit Added or deleted any medications: No Patient Arrived: Wheel Chair Any new allergies or adverse reactions: No Arrival Time: 14:28 Had a fall or experienced change in No Accompanied By: spouse activities of daily living that may affect Transfer Assistance: None risk of falls: Patient Identification Verified: Yes Signs or symptoms of abuse/neglect since last visito No Patient Requires Transmission-Based Precautions: No Hospitalized since last visit: No Patient Has Alerts: No Implantable device outside of the clinic excluding No cellular tissue based products placed in the center since last visit: Has Dressing in Place as Prescribed: Yes Has Compression in Place as Prescribed: Yes Pain Present Now: No Electronic Signature(s) Signed: 10/03/2022 4:47:04 PM By: Dellie Catholic RN Entered By: Dellie Catholic on 10/03/2022 14:34:02 -------------------------------------------------------------------------------- Clinic Level of Care Assessment Details Patient Name: Date of Service: Sherry, [name] 10/03/2022 2:00 PM Medical Record Number: [id number] Patient Account Number: [id number] Date of Birth/Sex: Treating RN: 1945-12-26 (76 y.o. [name] Primary Care Shafter Jupin: [name] Other Clinician: Referring Rashawd Laskaris: Treating Charity Tessier/Extender: [name] in Treatment: 1 Clinic Level of Care  Assessment Items TOOL 4 Quantity Score X- 1 0 Use when only an EandM is performed on FOLLOW-UP visit ASSESSMENTS - Nursing Assessment / Reassessment X- 1 10 Reassessment of Co-morbidities (includes updates in patient status) X- 1 5 Reassessment of Adherence to Treatment Plan ASSESSMENTS - Wound and Skin A ssessment / Reassessment []  - 0 Simple Wound Assessment / Reassessment - one wound X- 2 5 Complex Wound Assessment / Reassessment - multiple wounds X- 1 10 Dermatologic / Skin Assessment (not related to wound area) ASSESSMENTS - Focused Assessment []  - 0 Circumferential Edema Measurements - multi extremities []  - 0 Nutritional Assessment / Counseling / Intervention SYRIANNA, [name] ([id number]) [id number] Page 2 of 7 []  - 0 Lower Extremity Assessment (monofilament, tuning fork, pulses) []  - 0 Peripheral Arterial Disease Assessment (using hand held doppler) ASSESSMENTS - Ostomy and/or Continence Assessment and Care []  - 0 Incontinence Assessment and Management []  - 0 Ostomy Care Assessment and Management (repouching, etc.) PROCESS - Coordination of Care X - Simple Patient / Family Education for ongoing care 1 15 []  - 0 Complex (extensive) Patient / Family Education for ongoing care X- 1 10 Staff obtains Programmer, systems, Records, T Results / Process Orders est X- 1 10 Staff telephones HHA, Nursing Homes / Clarify orders / etc []  - 0 Routine Transfer to another Facility (non-emergent condition) []  - 0 Routine Hospital Admission (non-emergent condition) []  - 0 New Admissions / Biomedical engineer / Ordering NPWT Apligraf, etc. , []  - 0 Emergency Hospital Admission (emergent condition) X- 1 10 Simple Discharge Coordination []  - 0 Complex (extensive) Discharge Coordination PROCESS - Special Needs []  - 0 Pediatric / Minor Patient Management []  - 0 Isolation Patient Management []  - 0 Hearing / Language / Visual special needs []  -  0 Assessment of Community assistance (transportation, D/C planning, etc.) []  - 0 Additional assistance / Altered mentation []  - 0 Support Surface(s) Assessment (bed, cushion, seat, etc.) INTERVENTIONS - Wound Cleansing / Measurement []  -  0 Simple Wound Cleansing - one wound []  - 0 Complex Wound Cleansing - multiple wounds []  - 0 Wound Imaging (photographs - any number of wounds) []  - 0 Wound Tracing (instead of photographs) []  - 0 Simple Wound Measurement - one wound []  - 0 Complex Wound Measurement - multiple wounds INTERVENTIONS - Wound Dressings []  - 0 Small Wound Dressing one or multiple wounds []  - 0 Medium Wound Dressing one or multiple wounds []  - 0 Large Wound Dressing one or multiple wounds []  - 0 Application of Medications - topical []  - 0 Application of Medications - injection INTERVENTIONS - Miscellaneous []  - 0 External ear exam []  - 0 Specimen Collection (cultures, biopsies, blood, body fluids, etc.) []  - 0 Specimen(s) / Culture(s) sent or taken to Lab for analysis []  - 0 Patient Transfer (multiple staff / Civil Service fast streamer / Similar devices) []  - 0 Simple Staple / Suture removal (25 or less) []  - 0 Complex Staple / Suture removal (26 or more) []  - 0 Hypo / Hyperglycemic Management (close monitor of Blood Glucose) Mccary, Allisson H ([id number]) [id number].pdf Page 3 of 7 []  - 0 Ankle / Brachial Index (ABI) - do not check if billed separately X- 1 5 Vital Signs Has the patient been seen at the hospital within the last three years: Yes Total Score: 85 Level Of Care: New/Established - Level 3 Electronic Signature(s) Signed: 10/03/2022 4:47:04 PM By: Dellie Catholic RN Entered By: Dellie Catholic on 10/03/2022 16:31:34 -------------------------------------------------------------------------------- Encounter Discharge Information Details Patient Name: Date of Service: [name] [name] 10/03/2022 2:00 PM Medical Record Number:  [id number] Patient Account Number: [id number] Date of Birth/Sex: Treating RN: 12-19-1946 (76 y.o. [name] Primary Care Bulmaro Feagans: [name] Other Clinician: Referring Cordelro Gautreau: Treating Samai Corea/Extender: [name] in Treatment: 1 Encounter Discharge Information Items Discharge Condition: Stable Ambulatory Status: Wheelchair Discharge Destination: Home Transportation: Private Auto Accompanied By: spouse Schedule Follow-up Appointment: Yes Clinical Summary of Care: Patient Declined Electronic Signature(s) Signed: 10/03/2022 4:47:04 PM By: Dellie Catholic RN Entered By: Dellie Catholic on 10/03/2022 16:32:57 -------------------------------------------------------------------------------- Patient/Caregiver Education Details Patient Name: Date of Service: Alroy Dust 10/10/2023andnbsp2:00 PM Medical Record Number: [id number] Patient Account Number: [id number] Date of Birth/Gender: Treating RN: Jun 06, 1946 (76 y.o. [name] Primary Care Physician: [name] Other Clinician: Referring Physician: Treating Physician/Extender: [name] in Treatment: 1 Education Assessment Education Provided To: Patient Education Topics Provided Wound/Skin Impairment: Methods: Explain/Verbal Responses: Return demonstration correctly Electronic Signature(s) Signed: 10/03/2022 4:47:04 PM By: Dellie Catholic RN Verlee Monte, [name] ([id number]) [id number] Page 4 of 7 Entered By: Dellie Catholic on 10/03/2022 16:32:01 -------------------------------------------------------------------------------- Wound Assessment Details Patient Name: Date of Service: KEREN, ALVERIO 10/03/2022 2:00 PM Medical Record Number: [id number] Patient Account Number: [id number] Date of Birth/Sex: Treating RN: 09-01-1946 (75 y.o. [name] Primary Care Ercelle Winkles: [name] Other  Clinician: Referring Abagayle Klutts: Treating Adriann Thau/Extender: [name] in Treatment: 1 Wound Status Wound Number: 1 Primary Lymphedema Etiology: Wound Location: Right, Dorsal Foot Wound Open Wounding Event: Blister Status: Date Acquired: 08/23/2022 Comorbid Asthma, Chronic Obstructive Pulmonary Disease (COPD), Weeks Of Treatment: 1 History: Congestive Heart Failure, Hypertension, Rheumatoid Arthritis, Clustered Wound: No Neuropathy, Received Chemotherapy Wound Measurements Length: (cm) 1.1 Width: (cm) 1.1 Depth: (cm) 0.6 Area: (cm) 0.95 Volume: (cm) 0.57 % Reduction in Area: 8.4% % Reduction in Volume: 8.4% Epithelialization: None Tunneling: Yes Position (o'clock): 12 Maximum Distance: (cm) 2.4 Undermining: Yes Starting Position (o'clock): 12 Ending  Position (o'clock): 12 Maximum Distance: (cm) 0.4 Wound Description Classification: Full Thickness Without Exposed Suppor Wound Margin: Distinct, outline attached Exudate Amount: Medium Exudate Type: Serosanguineous Exudate Color: red, brown t Structures Foul Odor After Cleansing: No Slough/Fibrino Yes Wound Bed Granulation Amount: Small (1-33%) Exposed Structure Granulation Quality: Red Fascia Exposed: No Necrotic Amount: Large (67-100%) Fat Layer (Subcutaneous Tissue) Exposed: Yes Necrotic Quality: Eschar, Adherent Slough Tendon Exposed: No Muscle Exposed: No Joint Exposed: No Bone Exposed: No Periwound Skin Texture Texture Color No Abnormalities Noted: Yes No Abnormalities Noted: Yes Moisture Temperature / Pain No Abnormalities Noted: Yes Temperature: No Abnormality Treatment Notes Wound #1 (Foot) Wound Laterality: Dorsal, Right Cleanser Soap and Water Discharge Instruction: May shower and wash wound with dial antibacterial soap and water prior to dressing change. Wound Cleanser Discharge Instruction: Cleanse the wound with wound cleanser prior to applying a clean dressing  using gauze sponges, not tissue or cotton balls. KHLOEI, SPIKER ([id number]) [id number] Page 5 of 7 Peri-Wound Care Sween Lotion (Moisturizing lotion) Discharge Instruction: Apply moisturizing lotion as directed Topical Primary Dressing Iodoform packing strip 1/2 (in) Discharge Instruction: Lightly pack as instructed Secondary Dressing ABD Pad, 5x9 Discharge Instruction: Apply over primary dressing as directed. Zetuvit Plus 4x8 in Discharge Instruction: Apply over primary dressing as directed. Secured With Transpore Surgical Tape, 2x10 (in/yd) Discharge Instruction: Secure dressing with tape as directed. Compression Wrap Kerlix Roll 4.5x3.1 (in/yd) Discharge Instruction: Apply Kerlix and Coban compression as directed. Coban Self-Adherent Wrap 4x5 (in/yd) Discharge Instruction: Apply over Kerlix as directed. Compression Stockings Add-Ons Electronic Signature(s) Signed: 10/03/2022 4:47:04 PM By: Dellie Catholic RN Entered By: Dellie Catholic on 10/03/2022 16:19:33 -------------------------------------------------------------------------------- Wound Assessment Details Patient Name: Date of Service: ANNALYN, BLECHER [name] 10/03/2022 2:00 PM Medical Record Number: [id number] Patient Account Number: [id number] Date of Birth/Sex: Treating RN: [birth year] (76 y.o. [name] Primary Care Azani Brogdon: [name] Other Clinician: Referring Verneda Hollopeter: Treating Jennett Tarbell/Extender: [name] in Treatment: 1 Wound Status Wound Number: 2 Primary Abrasion Etiology: Wound Location: Right, Lateral Lower Leg Wound Open Wounding Event: Skin Tear/Laceration Status: Date Acquired: 09/20/2022 Comorbid Asthma, Chronic Obstructive Pulmonary Disease (COPD), Weeks Of Treatment: 1 History: Congestive Heart Failure, Hypertension, Rheumatoid Arthritis, Clustered Wound: No Neuropathy, Received Chemotherapy Wound Measurements Length:  (cm) 1.5 Width: (cm) 0.5 Depth: (cm) 0.1 Area: (cm) 0.589 Volume: (cm) 0.059 % Reduction in Area: 16.7% % Reduction in Volume: 16.9% Epithelialization: None Tunneling: No Undermining: No Wound Description Classification: Full Thickness Without Exposed Support Structures Wound Margin: Distinct, outline attached Exudate Amount: Medium Exudate Type: Serosanguineous Exudate Color: red, brown Bajaj, Anakaren H ([id number]) Wound Bed Granulation Amount: Medium (34-66%) Granulation Quality: Red Necrotic Amount: Medium (34-66%) Necrotic Quality: Eschar, Adherent Slough Foul Odor After Cleansing: No Slough/Fibrino Yes [id number] Page 6 of 7 Exposed Structure Fascia Exposed: No Fat Layer (Subcutaneous Tissue) Exposed: Yes Tendon Exposed: No Muscle Exposed: No Joint Exposed: No Bone Exposed: No Periwound Skin Texture Texture Color No Abnormalities Noted: Yes No Abnormalities Noted: Yes Moisture Temperature / Pain No Abnormalities Noted: Yes Temperature: No Abnormality Treatment Notes Wound #2 (Lower Leg) Wound Laterality: Right, Lateral Cleanser Soap and Water Discharge Instruction: May shower and wash wound with dial antibacterial soap and water prior to dressing change. Wound Cleanser Discharge Instruction: Cleanse the wound with wound cleanser prior to applying a clean dressing using gauze sponges, not tissue or cotton balls. Peri-Wound Care Sween Lotion (Moisturizing lotion) Discharge Instruction: Apply moisturizing lotion as directed Topical Primary Dressing KerraCel Ag Gelling Fiber Dressing,  2x2 in (silver alginate) Discharge Instruction: Apply silver alginate to wound bed as instructed Secondary Dressing ABD Pad, 5x9 Discharge Instruction: Apply over primary dressing as directed. Woven Gauze Sponge, Non-Sterile 4x4 in Discharge Instruction: Apply over primary dressing as directed. Secured With Transpore Surgical Tape, 2x10  (in/yd) Discharge Instruction: Secure dressing with tape as directed. Compression Wrap Kerlix Roll 4.5x3.1 (in/yd) Discharge Instruction: Apply Kerlix and Coban compression as directed. Coban Self-Adherent Wrap 4x5 (in/yd) Discharge Instruction: Apply over Kerlix as directed. Compression Stockings Add-Ons Electronic Signature(s) Signed: 10/03/2022 4:47:04 PM By: Dellie Catholic RN Entered By: Dellie Catholic on 10/03/2022 16:20:30 -------------------------------------------------------------------------------- Vitals Details Patient Name: Date of Service: [name] [name] 10/03/2022 2:00 PM Medical Record Number: [id number] Patient Account Number: [id number] Date of Birth/Sex: Treating RN: 1946/06/12 (76 y.o. [address] [place], [place] H ([id number]) [phone].pdf Page 7 of 7 Primary Care Ekansh Sherk: [name] Other Clinician: Referring Jeremaine Maraj: Treating Kaven Cumbie/Extender: [name] in Treatment: 1 Vital Signs Time Taken: 14:29 Temperature (F): 97.9 Height (in): 64 Pulse (bpm): 100 Weight (lbs): 111 Respiratory Rate (breaths/min): 16 Body Mass Index (BMI): 19.1 Blood Pressure (mmHg): 116/79 Reference Range: 80 - 120 mg / dl Electronic Signature(s) Signed: 10/03/2022 4:47:04 PM By: Dellie Catholic RN Entered By: Dellie Catholic on 10/03/2022 14:34:28

## 2022-10-04 NOTE — Progress Notes (Signed)
GRAYCEN, DEGAN ([id number]) [id number] Page 1 of 1 Visit Report for 10/03/2022 SuperBill Details Patient Name: Date of Service: [name], TONDREAU 10/03/2022 Medical Record Number: [id number] Patient Account Number: [id number] Date of Birth/Sex: Treating RN: Dec 09, 1946 (76 y.o. 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

## 2022-10-05 ENCOUNTER — Inpatient Hospital Stay (HOSPITAL_BASED_OUTPATIENT_CLINIC_OR_DEPARTMENT_OTHER): Payer: Medicare Other | Admitting: Adult Health

## 2022-10-05 ENCOUNTER — Other Ambulatory Visit: Payer: Self-pay

## 2022-10-05 ENCOUNTER — Encounter: Payer: Self-pay | Admitting: Adult Health

## 2022-10-05 ENCOUNTER — Inpatient Hospital Stay: Payer: Medicare Other | Attending: Nurse Practitioner

## 2022-10-05 VITALS — BP 84/57 | HR 143 | Temp 98.1°F | Resp 18 | Ht 63.0 in | Wt 103.9 lb

## 2022-10-05 DIAGNOSIS — R5383 Other fatigue: Secondary | ICD-10-CM | POA: Insufficient documentation

## 2022-10-05 DIAGNOSIS — M353 Polymyalgia rheumatica: Secondary | ICD-10-CM | POA: Insufficient documentation

## 2022-10-05 DIAGNOSIS — Z87891 Personal history of nicotine dependence: Secondary | ICD-10-CM | POA: Diagnosis not present

## 2022-10-05 DIAGNOSIS — C931 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: Secondary | ICD-10-CM

## 2022-10-05 DIAGNOSIS — K219 Gastro-esophageal reflux disease without esophagitis: Secondary | ICD-10-CM | POA: Insufficient documentation

## 2022-10-05 DIAGNOSIS — K900 Celiac disease: Secondary | ICD-10-CM | POA: Insufficient documentation

## 2022-10-05 DIAGNOSIS — E7800 Pure hypercholesterolemia, unspecified: Secondary | ICD-10-CM | POA: Insufficient documentation

## 2022-10-05 DIAGNOSIS — I4891 Unspecified atrial fibrillation: Secondary | ICD-10-CM | POA: Insufficient documentation

## 2022-10-05 LAB — CBC WITH DIFFERENTIAL/PLATELET
Abs Immature Granulocytes: 0.03 10*3/uL (ref 0.00–0.07)
Basophils Absolute: 0 10*3/uL (ref 0.0–0.1)
Basophils Relative: 1 %
Eosinophils Absolute: 0.1 10*3/uL (ref 0.0–0.5)
Eosinophils Relative: 2 %
HCT: 29.6 % — ABNORMAL LOW (ref 36.0–46.0)
Hemoglobin: 9.5 g/dL — ABNORMAL LOW (ref 12.0–15.0)
Immature Granulocytes: 1 %
Lymphocytes Relative: 22 %
Lymphs Abs: 1 10*3/uL (ref 0.7–4.0)
MCH: 35.2 pg — ABNORMAL HIGH (ref 26.0–34.0)
MCHC: 32.1 g/dL (ref 30.0–36.0)
MCV: 109.6 fL — ABNORMAL HIGH (ref 80.0–100.0)
Monocytes Absolute: 1.3 10*3/uL — ABNORMAL HIGH (ref 0.1–1.0)
Monocytes Relative: 28 %
Neutro Abs: 2.1 10*3/uL (ref 1.7–7.7)
Neutrophils Relative %: 46 %
Platelets: 41 10*3/uL — ABNORMAL LOW (ref 150–400)
RBC: 2.7 MIL/uL — ABNORMAL LOW (ref 3.87–5.11)
RDW: 17.5 % — ABNORMAL HIGH (ref 11.5–15.5)
WBC: 4.6 10*3/uL (ref 4.0–10.5)
nRBC: 0 % (ref 0.0–0.2)

## 2022-10-05 LAB — CMP (CANCER CENTER ONLY)
ALT: 20 U/L (ref 0–44)
AST: 21 U/L (ref 15–41)
Albumin: 3.7 g/dL (ref 3.5–5.0)
Alkaline Phosphatase: 49 U/L (ref 38–126)
Anion gap: 8 (ref 5–15)
BUN: 20 mg/dL (ref 8–23)
CO2: 28 mmol/L (ref 22–32)
Calcium: 9 mg/dL (ref 8.9–10.3)
Chloride: 101 mmol/L (ref 98–111)
Creatinine: 0.94 mg/dL (ref 0.44–1.00)
GFR, Estimated: >60 mL/min (ref >60)
Glucose, Bld: 128 mg/dL — ABNORMAL HIGH (ref 70–99)
Potassium: 4.3 mmol/L (ref 3.5–5.1)
Sodium: 137 mmol/L (ref 135–145)
Total Bilirubin: 0.9 mg/dL (ref 0.3–1.2)
Total Protein: 6 g/dL — ABNORMAL LOW (ref 6.5–8.1)

## 2022-10-05 NOTE — Progress Notes (Signed)
[facility] Follow up:    Sherry Pillar, MD 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  Please see concurrent tissue biopsy (below) for additional work-up and final diagnosis.    FINAL MICROSCOPIC DIAGNOSIS:   A. SOFT TISSUE MASS, PRE SACRAL, NEEDLE CORE BIOPSY:  -  Chronic lymphocytic leukemia/small lymphocytic lymphoma  -  Extra medullary hematopoiesis  -  See comment   COMMENT:  The biopsy consists of multiple soft tissue cores with lymphoid nodules and a dense hematopoietic infiltrate consistent with extra medullary hematopoiesis.  MPO and E-cadherin highlight myeloid and erythroid precursors respectively.  CD34 highlights increased vasculature and is also positive within the cytoplasm of megakaryocytes.  A few small, immature mononuclear cells appear to be positive for CD34 and CD117. TdT shows rare, scattered positive cells.  CD20 highlights aggregates of B cells which are admixed with CD3 positive T cells.  T cells are an admixture of CD4 and CD8.  The B cells are also positive for CD5, CD23 and Bcl-2.  The B cells do not show significant staining for CD10, BCL6 or cyclin D1.  CD138 highlights scattered plasma cells which  are polytypic by kappa and lambda in situ hybridization.  Flow cytometry performed on the sample (see WL [id number]) identified a kappa restricted CD5 positive B-cell population comprising 70% of lymphocytes.  In addition, a small myeloblast population comprised 2% of the total cellular events.   Overall, the findings are consistent with soft tissue involvement by  chronic lymphocytic leukemia/small lymphocytic lymphoma and extra medullary hematopoiesis. In reviewing the patient's CBC data (macrocytic anemia and thrombocytopenia), I would recommend a bone marrow biopsy to assess for marrow involvement by CLL/SLL.    12/23/2021 Imaging   EXAM: CT CHEST, ABDOMEN, AND PELVIS WITH CONTRAST  IMPRESSION: 1. Slight interval enlargement of a presacral soft tissue mass measuring 7.2 x 4.7 cm, previously 6.9 x 4.1 cm on prior MR of the pelvis dated 07/04/2021. By report, this represents a biopsy proven lymphoma. 2. Pleural nodule of the dependent right lower lobe overlying the posterior right tenth rib and pleural or paraspinous soft tissue mass overlying the right aspect of the T10 vertebral body, very slightly enlarged compared to prior examination of the chest dated 06/25/2020, consistent with additional sites of lymphomatous involvement given very indolent growth. These could be better assessed for metabolic activity by FDG PET/CT if desired. 3. There is mild, bibasilar predominant pulmonary fibrosis in a pattern featuring irregular peripheral interstitial opacity, septal thickening, but without clear evidence of subpleural bronchiolectasis or honeycombing, with a somewhat asymmetric distribution most conspicuously involving the right lower lobe and lingula. These findings are significantly worsened when compared to prior examination dated 06/25/2020, particularly in the right lower lobe. Given interval change, this may reflect sequelae  of interval infection or aspiration, however appearance is  generally suspicious for fibrotic interstitial lung disease, and if characterized by ATS pulmonary fibrosis is in an "indeterminate for UIP" pattern, differential considerations including both UIP and NSIP. 4. Emphysema.   Aortic Atherosclerosis (ICD10-I70.0) and Emphysema (ICD10-J43.9).   01/05/2022 Pathology Results   DIAGNOSIS:   BONE MARROW, ASPIRATE, CLOT, CORE:  -Hypercellular bone marrow for age with features of  myelodysplastic/myeloproliferative neoplasm  -Minor abnormal B-cell population  -See comment   PERIPHERAL BLOOD:  -Macrocytic anemia  -Neutrophilic left shift and monocytosis  -Thrombocytopenia   COMMENT:  The bone marrow is hypercellular for age with dyspoietic changes  involving myeloid cell lines associated with monocytosis and increased number of blastic cells (12%) as primarily seen by morphology, many of which display monocytic features.  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  In this background, there are several predominantly small lymphoid aggregates mostly composed of small lymphoid cells.  By flow cytometry, a minor abnormal B-cell population expressing CD5 is seen and representing 2% of all cells.  This correlate with previously known B-cell lymphoproliferative process.  Correlation with cytogenetic and FISH studies is strongly recommended.    DIAGNOSIS:   -Increased number of monocytic cells present (25%)  -Minor abnormal B-cell population identified.  -See comment   COMMENT:  Flow cytometric analysis shows increased number of monocytic cells representing 25% of all cells but without aberrant phenotype or CD34 expression.  A significant CD34-positive blastic population is not identified.  The lymphoid population shows a minor B-cell population representing 2% of all cells and  expressing B-cell antigens including CD20 associated with CD5, CD200 and possibly dim kappa expression.  The latter findings are abnormal and correlate with previously known B-cell lymphoproliferative process.  No significant T-cell phenotypic abnormalities identified.    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 female returns for f/u after receiving Azacitidine 2 weeks ago.  She tolerated treatment well.  We are following a rash on her foot that was present at her appointment on chemo day.  It did not appear acutely infected therefore she was treated and is here to check in today.    She has gone to the wound center twice this past week and has f/u tomorrow for her foot ulcer.  During her visits she undergoes dressing changes.  She has intermittent throbbing and she took tylenol earlier today around 11 am.    She previously saw Anda Kraft in symptom management for a rash she developed during treatment.  This resolved and she stopped taking claritin.  Her blood pressure was initially decreased upon her arrival to clinic, however upon manual recheck it is 92/50.    Patient Active Problem List   Diagnosis Date Noted   Celiac disease 10/05/2022  Polymyalgia rheumatica ([place]) 10/05/2022   Gastro-esophageal reflux disease without esophagitis 10/05/2022   Pure hypercholesterolemia 10/05/2022   Acute  urinary retention 06/20/2022   Arterial vascular disease 06/20/2022   Atrial fibrillation with RVR ([place]) 06/19/2022   Acute respiratory failure with hypoxia ([place]) 05/19/2022   Rheumatoid arthritis ([place]) 05/19/2022   Closed fracture of metatarsal bone of left foot 04/21/2022   Hematoma of left foot 04/08/2022   Malnutrition of moderate degree 18/84/1660   Chronic systolic CHF (congestive heart failure) ([place]) 04/05/2022   Dyspnea on exertion 04/05/2022   Hypokalemia 04/05/2022   Physical deconditioning 04/05/2022   Chronic kidney disease, stage 3a ([place]) 04/05/2022   Constipation 04/05/2022   Atrial fibrillation with slow ventricular response ([place]) 03/29/2022   Multifocal pneumonia 03/29/2022   Pancytopenia ([place]) 03/29/2022   Bilateral pleural effusion 03/29/2022   Port-A-Cath in place 02/06/2022   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  MEDICAL HISTORY: Past Medical History:  Diagnosis Date   Arthritis    Rheumatoid arthritis   Celiac disease    Chronic kidney disease    stage 3 from MD notes   COPD (chronic obstructive pulmonary disease) ([place])    Dyspnea    with going up stairs   Family history of adverse reaction to anesthesia    father had hard time waking up   Headache    sinus headaches   Hot flashes    Hypertension    Iron deficiency anemia    Pneumonia    per patient "I have walking pneumonia"    SURGICAL HISTORY: Past Surgical History:  Procedure Laterality Date   COLONOSCOPY     ECTOPIC PREGNANCY SURGERY      x 2   IR IMAGING GUIDED PORT INSERTION  01/31/2022   REVERSE SHOULDER ARTHROPLASTY Right 02/02/2017   Procedure: RIGHT REVERSE SHOULDER  ARTHROPLASTY;  Surgeon: Netta Cedars, MD;  Location: [facility];  Service: Orthopedics;  Laterality: Right;    SOCIAL HISTORY: Social History   Socioeconomic History   Marital status: Married    Spouse name: Not on file   Number of children: Not on file   Years of education: Not on file   Highest education level: Not on file  Occupational History   Not on file  Tobacco Use   Smoking status: Former    Packs/day: 1.00    Years: 30.00    Total pack years: 30.00    Types: Cigarettes    Quit date: 2005    Years since quitting: 18.7   Smokeless tobacco: Never  Vaping Use   Vaping Use: Never used  Substance and Sexual Activity   Alcohol use: Yes    Alcohol/week: 12.0 - 14.0 standard drinks of alcohol    Types: 12 - 14 Glasses of wine per week   Drug use: No   Sexual activity: Not Currently  Other Topics Concern   Not on file  Social History Narrative   Not on file   Social Determinants of Health   Financial Resource Strain: Not on file  Food Insecurity: Not on file  Transportation Needs: Not on file  Physical Activity: Not on file  Stress: Not on file  Social Connections: Not on file  Intimate Partner Violence: Not  on file    FAMILY HISTORY: Family History  Problem Relation Age of Onset   Peripheral Artery Disease Father    Diabetes Father     Review of Systems  Constitutional:  Negative for appetite change, chills, fatigue, fever and unexpected weight change.  HENT:   Negative for hearing loss, lump/mass and trouble swallowing.   Eyes:  Negative for eye problems and icterus.  Respiratory:  Negative for chest tightness, cough and shortness of breath.   Cardiovascular:  Negative for chest pain, leg swelling and palpitations.  Gastrointestinal:  Negative for abdominal distention, abdominal pain, constipation, diarrhea, nausea and vomiting.  Endocrine: Negative for hot flashes.  Genitourinary:  Negative for difficulty urinating.   Musculoskeletal:  Negative for  arthralgias.  Skin:  Negative for itching and rash.  Neurological:  Negative for dizziness, extremity weakness, headaches and numbness.  Hematological:  Negative for adenopathy. Does not bruise/bleed easily.  Psychiatric/Behavioral:  Negative for depression. The patient is not nervous/anxious.       PHYSICAL EXAMINATION  ECOG PERFORMANCE STATUS: 2 - Symptomatic, <50% confined to bed  Vitals:   10/05/22 1236  BP: (!) 84/57  Pulse: (!) 143  Resp: 18  Temp: 98.1 F (36.7 C)  SpO2: 98%  BP 92/50 manual, HR 98--irregular, patient has chronic A fib  Physical Exam Constitutional:      General: She is not in acute distress.    Appearance: Normal appearance. She is not toxic-appearing.  HENT:     Head: Normocephalic and atraumatic.  Eyes:     General: No scleral icterus. Cardiovascular:     Rate and Rhythm: Normal rate. Rhythm irregular.     Pulses: Normal pulses.     Heart sounds: Normal heart sounds.  Pulmonary:     Effort: Pulmonary effort is normal.     Breath sounds: Normal breath sounds.  Abdominal:     General: Abdomen is flat. Bowel sounds are normal. There is no distension.     Palpations: Abdomen is soft.     Tenderness: There is no abdominal tenderness.  Musculoskeletal:        General: No swelling.     Cervical back: Neck supple.  Lymphadenopathy:     Cervical: No cervical adenopathy.  Skin:    General: Skin is warm and dry.     Findings: No rash.  Neurological:     General: No focal deficit present.     Mental Status: She is alert.  Psychiatric:        Mood and Affect: Mood normal.        Behavior: Behavior normal.     LABORATORY DATA:  CBC    Component Value Date/Time   WBC 4.6 10/05/2022 1144   RBC 2.70 (L) 10/05/2022 1144   HGB 9.5 (L) 10/05/2022 1144   HGB 10.0 (L) 09/18/2022 1217   HGB 10.8 (L) 01/03/2017 1246   HCT 29.6 (L) 10/05/2022 1144   HCT 32.8 (L) 01/03/2017 1246   PLT 41 (L) 10/05/2022 1144   PLT 172 09/18/2022 1217   PLT 163  01/03/2017 1246   MCV 109.6 (H) 10/05/2022 1144   MCV 102.8 (H) 01/03/2017 1246   MCH 35.2 (H) 10/05/2022 1144   MCHC 32.1 10/05/2022 1144   RDW 17.5 (H) 10/05/2022 1144   RDW 14.3 01/03/2017 1246   LYMPHSABS 1.0 10/05/2022 1144   LYMPHSABS 2.9 01/03/2017 1246   MONOABS 1.3 (H) 10/05/2022 1144   MONOABS 1.1 (H) 01/03/2017 1246   EOSABS 0.1 10/05/2022 1144  EOSABS 0.1 01/03/2017 1246   BASOSABS 0.0 10/05/2022 1144   BASOSABS 0.0 01/03/2017 1246    CMP     Component Value Date/Time   NA 137 10/05/2022 1144   NA 141 01/04/2016 1001   K 4.3 10/05/2022 1144   K 4.5 01/04/2016 1001   CL 101 10/05/2022 1144   CO2 28 10/05/2022 1144   CO2 24 01/04/2016 1001   GLUCOSE 128 (H) 10/05/2022 1144   GLUCOSE 105 01/04/2016 1001   BUN 20 10/05/2022 1144   BUN 21.7 01/04/2016 1001   CREATININE 0.94 10/05/2022 1144   CREATININE 1.2 (H) 01/04/2016 1001   CALCIUM 9.0 10/05/2022 1144   CALCIUM 9.7 01/04/2016 1001   PROT 6.0 (L) 10/05/2022 1144   PROT 7.0 01/04/2016 1001   ALBUMIN 3.7 10/05/2022 1144   ALBUMIN 4.0 01/04/2016 1001   AST 21 10/05/2022 1144   AST 26 01/04/2016 1001   ALT 20 10/05/2022 1144   ALT 24 01/04/2016 1001   ALKPHOS 49 10/05/2022 1144   ALKPHOS 61 01/04/2016 1001   BILITOT 0.9 10/05/2022 1144   BILITOT 0.41 01/04/2016 1001   GFRNONAA >60 10/05/2022 1144   GFRAA 56 (L) 01/09/2020 1339       ASSESSMENT and THERAPY PLAN:   CMML (chronic myelomonocytic leukemia) ([facility]) [name] is here today for f/u after undergoing azacitidine 2 weeks ago.  She tolerated treatment relatively well other than experiencing some fatigue.  Her labs are stable and I reviewed them with her in detail.    Her foot ulcer is healing and she continues to f/u with the wound center 3 times per week.  Her blood pressure and pulse were read inaccurately as she is in A fib.  Manually her bp and HR were much better.    [name] will return on 11/6 for labs, f/u with Dr. Burr Medico, and her next  Azacitidine.    All questions were answered. The patient knows to call the clinic with any problems, questions or concerns. We can certainly see the patient much sooner if necessary.  Total encounter time:20 minutes*in face-to-face visit time, chart review, lab review, care coordination, order entry, and documentation of the encounter time.  Wilber Bihari, NP 10/08/22 5:16 PM Medical Oncology and Hematology [facility] [place], [place] [address] Tel. [phone]    Fax. [phone]  *Total Encounter Time as defined by the Centers for Medicare and Medicaid Services includes, in addition to the face-to-face time of a patient visit (documented in the note above) non-face-to-face time: obtaining and reviewing outside history, ordering and reviewing medications, tests or procedures, care coordination (communications with other health care professionals or caregivers) and documentation in the medical record.

## 2022-10-06 ENCOUNTER — Encounter (HOSPITAL_BASED_OUTPATIENT_CLINIC_OR_DEPARTMENT_OTHER): Payer: Medicare Other | Admitting: Internal Medicine

## 2022-10-06 DIAGNOSIS — E11622 Type 2 diabetes mellitus with other skin ulcer: Secondary | ICD-10-CM | POA: Diagnosis not present

## 2022-10-06 DIAGNOSIS — E11621 Type 2 diabetes mellitus with foot ulcer: Secondary | ICD-10-CM | POA: Diagnosis not present

## 2022-10-06 DIAGNOSIS — E1122 Type 2 diabetes mellitus with diabetic chronic kidney disease: Secondary | ICD-10-CM | POA: Diagnosis not present

## 2022-10-06 DIAGNOSIS — S80821A Blister (nonthermal), right lower leg, initial encounter: Secondary | ICD-10-CM | POA: Diagnosis not present

## 2022-10-06 DIAGNOSIS — L97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 evidence of necrosis: Secondary | ICD-10-CM | POA: Diagnosis not present

## 2022-10-06 DIAGNOSIS — L97812 Non-pressure chronic ulcer of other part of right lower leg with fat layer exposed: Secondary | ICD-10-CM | POA: Diagnosis not present

## 2022-10-06 DIAGNOSIS — M069 Rheumatoid arthritis, unspecified: Secondary | ICD-10-CM | POA: Diagnosis not present

## 2022-10-06 DIAGNOSIS — S90821A Blister (nonthermal), right foot, initial encounter: Secondary | ICD-10-CM | POA: Diagnosis not present

## 2022-10-08 ENCOUNTER — Encounter: Payer: Self-pay | Admitting: Hematology

## 2022-10-08 NOTE — Assessment & Plan Note (Signed)
[name] is here today for f/u after undergoing azacitidine 2 weeks ago.  She tolerated treatment relatively well other than experiencing some fatigue.  Her labs are stable and I reviewed them with her in detail.    Her foot ulcer is healing and she continues to f/u with the wound center 3 times per week.  Her blood pressure and pulse were read inaccurately as she is in A fib.  Manually her bp and HR were much better.    [name] will return on 11/6 for labs, f/u with Dr. Burr Medico, and her next Azacitidine.

## 2022-10-09 NOTE — Progress Notes (Signed)
[name], [name] ([id number]) [id number] Page 1 of 10 Visit Report for 10/06/2022 Arrival Information Details Patient Name: Date of Service: [name], [name] 10/06/2022 9:15 A M Medical Record Number: [id number] Patient Account Number: [id number] Date of Birth/Sex: Treating RN: 1946/10/21 (76 y.o. [name] Primary Care [name]: [name] Other Clinician: Referring [name]: Treating [name]/Extender: [name] in Treatment: 2 Visit Information History Since Last Visit Added or deleted any medications: No Patient Arrived: Wheel Chair Any new allergies or adverse reactions: No Arrival Time: 09:22 Had a fall or experienced change in No Accompanied By: husband activities of daily living that may affect Transfer Assistance: None risk of falls: Patient Identification Verified: Yes Signs or symptoms of abuse/neglect since last visito No Secondary Verification Process Completed: Yes Hospitalized since last visit: No Patient Requires Transmission-Based Precautions: No Implantable device outside of the clinic excluding No Patient Has Alerts: No cellular tissue based products placed in the center since last visit: Has Dressing in Place as Prescribed: Yes Has Compression in Place as Prescribed: Yes Pain Present Now: Yes Electronic Signature(s) Signed: 10/06/2022 4:55:30 PM By: Adline Peals Entered By: Adline Peals on 10/06/2022 09:28:03 -------------------------------------------------------------------------------- Clinic Level of Care Assessment Details Patient Name: Date of Service: [name] 10/06/2022 9:15 A M Medical Record Number: [id number] Patient Account Number: [id number] Date of Birth/Sex: Treating RN: 06-04-1946 (76 y.o. [name] Primary Care [name]: [name] Other Clinician: Referring [name]: Treating [name]/Extender: [name] in Treatment: 2 Clinic Level of Care Assessment Items TOOL 4 Quantity Score X- 1 0 Use when only an EandM is performed on FOLLOW-UP visit ASSESSMENTS - Nursing Assessment / Reassessment X- 1 10 Reassessment of Co-morbidities (includes updates in patient status) X- 1 5 Reassessment of Adherence to Treatment Plan ASSESSMENTS - Wound and Skin A ssessment / Reassessment X - Simple Wound Assessment / Reassessment - one wound 1 5 []  - 0 Complex Wound Assessment / Reassessment - multiple wounds []  - 0 Dermatologic / Skin Assessment (not related to wound area) ASSESSMENTS - Focused Assessment X- 1 5 Circumferential Edema Measurements - multi extremities []  - 0 Nutritional Assessment / Counseling / Intervention [name], [name] ([id number]) [id number] Page 2 of 10 X- 1 5 Lower Extremity Assessment (monofilament, tuning fork, pulses) []  - 0 Peripheral Arterial Disease Assessment (using hand held doppler) ASSESSMENTS - Ostomy and/or Continence Assessment and Care []  - 0 Incontinence Assessment and Management []  - 0 Ostomy Care Assessment and Management (repouching, etc.) PROCESS - Coordination of Care X - Simple Patient / Family Education for ongoing care 1 15 []  - 0 Complex (extensive) Patient / Family Education for ongoing care X- 1 10 Staff obtains Programmer, systems, Records, T Results / Process Orders est []  - 0 Staff telephones HHA, Nursing Homes / Clarify orders / etc []  - 0 Routine Transfer to another Facility (non-emergent condition) []  - 0 Routine Hospital Admission (non-emergent condition) X- 1 15 New Admissions / Biomedical engineer / Ordering NPWT Apligraf, etc. , []  - 0 Emergency Hospital Admission (emergent condition) X- 1 10 Simple Discharge Coordination []  - 0 Complex (extensive) Discharge Coordination PROCESS - Special Needs []  - 0 Pediatric / Minor Patient Management []  - 0 Isolation Patient Management []  -  0 Hearing / Language / Visual special needs []  - 0 Assessment of Community assistance (transportation, D/C planning, etc.) []  - 0 Additional assistance / Altered mentation []  - 0 Support Surface(s) Assessment (bed, cushion, seat, etc.) INTERVENTIONS -  Wound Cleansing / Measurement X - Simple Wound Cleansing - one wound 1 5 []  - 0 Complex Wound Cleansing - multiple wounds X- 1 5 Wound Imaging (photographs - any number of wounds) []  - 0 Wound Tracing (instead of photographs) X- 1 5 Simple Wound Measurement - one wound []  - 0 Complex Wound Measurement - multiple wounds INTERVENTIONS - Wound Dressings []  - 0 Small Wound Dressing one or multiple wounds X- 1 15 Medium Wound Dressing one or multiple wounds []  - 0 Large Wound Dressing one or multiple wounds []  - 0 Application of Medications - topical []  - 0 Application of Medications - injection INTERVENTIONS - Miscellaneous []  - 0 External ear exam []  - 0 Specimen Collection (cultures, biopsies, blood, body fluids, etc.) []  - 0 Specimen(s) / Culture(s) sent or taken to Lab for analysis []  - 0 Patient Transfer (multiple staff / Civil Service fast streamer / Similar devices) []  - 0 Simple Staple / Suture removal (25 or less) []  - 0 Complex Staple / Suture removal (26 or more) []  - 0 Hypo / Hyperglycemic Management (close monitor of Blood Glucose) [name], [name] ([id number]) [id number] Page 3 of 10 []  - 0 Ankle / Brachial Index (ABI) - do not check if billed separately X- 1 5 Vital Signs Has the patient been seen at the hospital within the last three years: Yes Total Score: 115 Level Of Care: New/Established - Level 3 Electronic Signature(s) Signed: 10/06/2022 4:55:30 PM By: Adline Peals Entered By: Adline Peals on 10/06/2022 12:46:37 -------------------------------------------------------------------------------- Encounter Discharge Information Details Patient Name: Date of Service: [name]. 10/06/2022 9:15 A M Medical Record Number: [id number] Patient Account Number: [id number] Date of Birth/Sex: Treating RN: May 05, 1946 (76 y.o. [name] Primary Care [name]: [name] Other Clinician: Referring Dilana Mcphie: Treating Makaylen Thieme/Extender: [name] in Treatment: 2 Encounter Discharge Information Items Discharge Condition: Stable Ambulatory Status: Wheelchair Discharge Destination: Home Transportation: Private Auto Accompanied By: self Schedule Follow-up Appointment: Yes Clinical Summary of Care: Patient Declined Electronic Signature(s) Signed: 10/06/2022 4:55:30 PM By: Sabas Sous By: Adline Peals on 10/06/2022 12:47:35 -------------------------------------------------------------------------------- Lower Extremity Assessment Details Patient Name: Date of Service: [name], [name] 10/06/2022 9:15 A M Medical Record Number: [id number] Patient Account Number: [id number] Date of Birth/Sex: Treating RN: Apr 29, 1946 (76 y.o. [name] Primary Care Lashawne Dura: [name] Other Clinician: Referring Maysie Parkhill: Treating Loyal Holzheimer/Extender: [name] in Treatment: 2 Edema Assessment Assessed: [Left: No] [Right: No] [Left: Edema] [Right: :] Calf Left: Right: Point of Measurement: From Medial Instep 27 cm Ankle Left: Right: Point of Measurement: From Medial Instep 20.1 cm Vascular Assessment Left: [[id number] Page 4 of 10Right:] Pulses: Dorsalis Pedis Palpable: [[id number] Page 4 of 10Yes] Electronic Signature(s) Signed: 10/06/2022 4:55:30 PM By: Sabas Sous By: Adline Peals on 10/06/2022 09:34:41 -------------------------------------------------------------------------------- Multi Wound Chart Details Patient Name: Date of Service: [name]. 10/06/2022 9:15 A  M Medical Record Number: [id number] Patient Account Number: [id number] Date of Birth/Sex: Treating RN: 28-May-1946 (77 y.o. F) Primary Care Breckin Zafar: [name] Other Clinician: Referring Regginald Pask: Treating Gerturde Kuba/Extender: [name] in Treatment: 2 Vital Signs Height(in): 31 Pulse(bpm): 36 Weight(lbs): 111 Blood Pressure(mmHg): 105/74 Body Mass Index(BMI): 19.1 Temperature(F): 97.9 Respiratory Rate(breaths/min): 16 [1:Photos:] [N/A:N/A] Right, Dorsal Foot Right, Lateral Lower Leg N/A Wound Location: Blister Skin Tear/Laceration N/A Wounding Event: Lymphedema Abrasion N/A Primary Etiology: Asthma, Chronic Obstructive Asthma, Chronic Obstructive N/A Comorbid History: Pulmonary Disease (COPD), Pulmonary Disease (COPD), Congestive  Heart Failure, Congestive Heart Failure, Hypertension, Rheumatoid Arthritis, Hypertension, Rheumatoid Arthritis, Neuropathy, Received Chemotherapy Neuropathy, Received Chemotherapy 08/23/2022 09/20/2022 N/A Date Acquired: 2 2 N/A Weeks of Treatment: Open Open N/A Wound Status: No No N/A Wound Recurrence: 1.5x1.1x0.7 0.1x0.1x0.1 N/A Measurements L x W x D (cm) 1.296 0.008 N/A A (cm) : rea 0.907 0.001 N/A Volume (cm) : -25.00% 98.90% N/A % Reduction in A rea: -45.80% 98.60% N/A % Reduction in Volume: 12 Starting Position 1 (o'clock): 12 Ending Position 1 (o'clock): 0.9 Maximum Distance 1 (cm): Yes No N/A Undermining: Full Thickness Without Exposed Full Thickness Without Exposed N/A Classification: Support Structures Support Structures Medium None Present N/A Exudate A mount: Serosanguineous N/A N/A Exudate Type: red, brown N/A N/A Exudate Color: Distinct, outline attached Distinct, outline attached N/A Wound Margin: Large (67-100%) Small (1-33%) N/A Granulation Amount: Red, Pink Red N/A Granulation Quality: Small (1-33%) Large (67-100%) N/A Necrotic Amount: Adherent Slough Eschar  N/A Necrotic Tissue: Fat Layer (Subcutaneous Tissue): Yes Fat Layer (Subcutaneous Tissue): Yes N/A Exposed Structures: Fascia: No Fascia: No Tendon: No Tendon: No [name], Tamaya [name] ([id number]) [phone].pdf Page 5 of 10 Muscle: No Muscle: No Joint: No Joint: No Bone: No Bone: No None Large (67-100%) N/A Epithelialization: No Abnormalities Noted No Abnormalities Noted N/A Periwound Skin Texture: Maceration: Yes Dry/Scaly: Yes N/A Periwound Skin Moisture: Dry/Scaly: Yes No Abnormalities Noted No Abnormalities Noted N/A Periwound Skin Color: No Abnormality No Abnormality N/A Temperature: Treatment Notes Electronic Signature(s) Signed: 10/08/2022 7:19:32 PM By: Linton Ham MD Entered By: Linton Ham on 10/06/2022 10:20:13 -------------------------------------------------------------------------------- Multi-Disciplinary Care Plan Details Patient Name: Date of Service: [name]. 10/06/2022 9:15 A M Medical Record Number: [id number] Patient Account Number: [id number] Date of Birth/Sex: Treating RN: 10-11-1946 (76 y.o. [name] Primary Care Anyely Cunning: [name] Other Clinician: Referring Brittian Renaldo: Treating Laquasha Groome/Extender: [name] in Treatment: 2 Active Inactive Peripheral Neuropathy Nursing Diagnoses: Potential alteration in peripheral tissue perfusion (select prior to confirmation of diagnosis) Goals: Patient/caregiver will verbalize understanding of disease process and disease management Date Initiated: 09/20/2022 Target Resolution Date: 10/18/2022 Goal Status: Active Interventions: Provide education on Management of Neuropathy and Related Ulcers Treatment Activities: Patient referred for customized footwear/offloading : 09/20/2022 Notes: Wound/Skin Impairment Nursing Diagnoses: Knowledge deficit related to ulceration/compromised skin integrity Goals: Ulcer/skin breakdown  will have a volume reduction of 30% by week 4 Date Initiated: 09/20/2022 Target Resolution Date: 10/18/2022 Goal Status: Active Interventions: Assess ulceration(s) every visit Treatment Activities: Skin care regimen initiated : 09/20/2022 Topical wound management initiated : 09/20/2022 Notes: Electronic Signature(s) Signed: 10/06/2022 4:55:30 PM By: Cathren Laine, [name] ([id number]) [id number] Page 6 of 10 Entered By: Adline Peals on 10/06/2022 11:44:58 -------------------------------------------------------------------------------- Pain Assessment Details Patient Name: Date of Service: [name], [name] 10/06/2022 9:15 A M Medical Record Number: [id number] Patient Account Number: [id number] Date of Birth/Sex: Treating RN: 1946/10/26 (76 y.o. [name] Primary Care Raydon Chappuis: [name] Other Clinician: Referring Jillene Wehrenberg: Treating Kaizer Dissinger/Extender: [name] in Treatment: 2 Active Problems Location of Pain Severity and Description of Pain Patient Has Paino Yes Site Locations Pain Location: Pain in Ulcers Duration of the Pain. Constant / Intermittento Constant Rate the pain. Current Pain Level: 4 Character of Pain Describe the Pain: Aching Pain Management and Medication Current Pain Management: Medication: Yes Electronic Signature(s) Signed: 10/06/2022 4:55:30 PM By: Adline Peals Entered By: Adline Peals on 10/06/2022 09:28:32 -------------------------------------------------------------------------------- Patient/Caregiver Education Details Patient Name: Date of Service: [name]. 10/13/2023andnbsp9:15  A M Medical Record Number: [id number] Patient Account Number: [id number] Date of Birth/Gender: Treating RN: 06-02-1946 (76 y.o. [name] Primary Care Physician: [name] Other Clinician: Referring Physician: Treating  Physician/Extender: [name] in Treatment: 2 Education Assessment Education Provided To: [name], [name] ([id number]) [id number] Page 7 of 10 Patient Education Topics Provided Wound/Skin Impairment: Methods: Explain/Verbal Responses: Reinforcements needed, State content correctly Electronic Signature(s) Signed: 10/06/2022 4:55:30 PM By: Adline Peals Entered By: Adline Peals on 10/06/2022 11:45:30 -------------------------------------------------------------------------------- Wound Assessment Details Patient Name: Date of Service: [name], Sherry RO N [name]. 10/06/2022 9:15 A M Medical Record Number: [id number] Patient Account Number: [id number] Date of Birth/Sex: Treating RN: 01/09/1946 (76 y.o. [name] Primary Care Rita Vialpando: [name] Other Clinician: Referring Mohanad Carsten: Treating Anaja Monts/Extender: [name] in Treatment: 2 Wound Status Wound Number: 1 Primary Lymphedema Etiology: Wound Location: Right, Dorsal Foot Wound Open Wounding Event: Blister Status: Date Acquired: 08/23/2022 Comorbid Asthma, Chronic Obstructive Pulmonary Disease (COPD), Weeks Of Treatment: 2 History: Congestive Heart Failure, Hypertension, Rheumatoid Arthritis, Clustered Wound: No Neuropathy, Received Chemotherapy Photos Wound Measurements Length: (cm) 1.5 Width: (cm) 1.1 Depth: (cm) 0.7 Area: (cm) 1.296 Volume: (cm) 0.907 % Reduction in Area: -25% % Reduction in Volume: -45.8% Epithelialization: None Tunneling: No Undermining: Yes Starting Position (o'clock): 12 Ending Position (o'clock): 12 Maximum Distance: (cm) 0.9 Wound Description Classification: Full Thickness Without Exposed Suppor Wound Margin: Distinct, outline attached Exudate Amount: Medium Exudate Type: Serosanguineous Exudate Color: red, brown t Structures Foul Odor After Cleansing: No Slough/Fibrino  Yes Wound Bed Granulation Amount: Large (67-100%) Exposed Structure Granulation Quality: Red, Pink Fascia Exposed: No Necrotic Amount: Small (1-33%) Fat Layer (Subcutaneous Tissue) Exposed: Yes Dirden, Sheilyn [name] ([id number]) [id number].pdf Page 8 of 10 Necrotic Quality: Adherent Slough Tendon Exposed: No Muscle Exposed: No Joint Exposed: No Bone Exposed: No Periwound Skin Texture Texture Color No Abnormalities Noted: Yes No Abnormalities Noted: Yes Moisture Temperature / Pain No Abnormalities Noted: Yes Temperature: No Abnormality Treatment Notes Wound #1 (Foot) Wound Laterality: Dorsal, Right Cleanser Soap and Water Discharge Instruction: May shower and wash wound with dial antibacterial soap and water prior to dressing change. Wound Cleanser Discharge Instruction: Cleanse the wound with wound cleanser prior to applying a clean dressing using gauze sponges, not tissue or cotton balls. Peri-Wound Care Sween Lotion (Moisturizing lotion) Discharge Instruction: Apply moisturizing lotion as directed Topical Skintegrity Hydrogel 4 (oz) Discharge Instruction: Apply hydrogel as directed Primary Dressing Promogran Prisma Matrix, 4.34 (sq in) (silver collagen) Discharge Instruction: Moisten collagen with saline or hydrogel Secondary Dressing ABD Pad, 5x9 Discharge Instruction: Apply over primary dressing as directed. Zetuvit Plus 4x8 in Discharge Instruction: Apply over primary dressing as directed. Secured With Transpore Surgical Tape, 2x10 (in/yd) Discharge Instruction: Secure dressing with tape as directed. Compression Wrap Kerlix Roll 4.5x3.1 (in/yd) Discharge Instruction: Apply Kerlix and Coban compression as directed. Coban Self-Adherent Wrap 4x5 (in/yd) Discharge Instruction: Apply over Kerlix as directed. Compression Stockings Add-Ons Electronic Signature(s) Signed: 10/06/2022 4:55:30 PM By: Adline Peals Entered By: Adline Peals on  10/06/2022 09:39:45 -------------------------------------------------------------------------------- Wound Assessment Details Patient Name: Date of Service: [name], Sherry RO N [name]. 10/06/2022 9:15 A M Medical Record Number: [id number] Patient Account Number: [id number] Date of Birth/Sex: Treating RN: 06/26/1946 (76 y.o. [name] Primary Care Emmer Lillibridge: [name] Other Clinician: Referring Myriam Brandhorst: Treating Hisae Decoursey/Extender: [name] in Treatment: [place], [name] ([id number]) [id number] Page 9 of 10 Wound Status Wound Number: 2 Primary Abrasion Etiology: Wound Location: Right, Lateral  Lower Leg Wound Open Wounding Event: Skin Tear/Laceration Status: Date Acquired: 09/20/2022 Comorbid Asthma, Chronic Obstructive Pulmonary Disease (COPD), Weeks Of Treatment: 2 History: Congestive Heart Failure, Hypertension, Rheumatoid Arthritis, Clustered Wound: No Neuropathy, Received Chemotherapy Photos Wound Measurements Length: (cm) 0.1 Width: (cm) 0.1 Depth: (cm) 0.1 Area: (cm) 0.008 Volume: (cm) 0.001 % Reduction in Area: 98.9% % Reduction in Volume: 98.6% Epithelialization: Large (67-100%) Tunneling: No Undermining: No Wound Description Classification: Full Thickness Without Exposed Support Structures Wound Margin: Distinct, outline attached Exudate Amount: None Present Foul Odor After Cleansing: No Slough/Fibrino No Wound Bed Granulation Amount: Small (1-33%) Exposed Structure Granulation Quality: Red Fascia Exposed: No Necrotic Amount: Large (67-100%) Fat Layer (Subcutaneous Tissue) Exposed: Yes Necrotic Quality: Eschar Tendon Exposed: No Muscle Exposed: No Joint Exposed: No Bone Exposed: No Periwound Skin Texture Texture Color No Abnormalities Noted: Yes No Abnormalities Noted: Yes Moisture Temperature / Pain No Abnormalities Noted: Yes Temperature: No Abnormality Treatment Notes Wound #2  (Lower Leg) Wound Laterality: Right, Lateral Cleanser Soap and Water Discharge Instruction: May shower and wash wound with dial antibacterial soap and water prior to dressing change. Wound Cleanser Discharge Instruction: Cleanse the wound with wound cleanser prior to applying a clean dressing using gauze sponges, not tissue or cotton balls. Peri-Wound Care Sween Lotion (Moisturizing lotion) Discharge Instruction: Apply moisturizing lotion as directed Topical Skintegrity Hydrogel 4 (oz) Discharge Instruction: Apply hydrogel as directed Primary Dressing Promogran Prisma Matrix, 4.34 (sq in) (silver collagen) Discharge Instruction: Moisten collagen with saline or hydrogel Scroggins, Collie [name] ([id number]) [phone] Page 10 of 10 Secondary Dressing ABD Pad, 5x9 Discharge Instruction: Apply over primary dressing as directed. Woven Gauze Sponge, Non-Sterile 4x4 in Discharge Instruction: Apply over primary dressing as directed. Secured With Transpore Surgical Tape, 2x10 (in/yd) Discharge Instruction: Secure dressing with tape as directed. Compression Wrap Kerlix Roll 4.5x3.1 (in/yd) Discharge Instruction: Apply Kerlix and Coban compression as directed. Coban Self-Adherent Wrap 4x5 (in/yd) Discharge Instruction: Apply over Kerlix as directed. Compression Stockings Add-Ons Electronic Signature(s) Signed: 10/06/2022 4:55:30 PM By: Adline Peals Entered By: Adline Peals on 10/06/2022 09:40:13 -------------------------------------------------------------------------------- Vitals Details Patient Name: Date of Service: [name]. 10/06/2022 9:15 A M Medical Record Number: [id number] Patient Account Number: [id number] Date of Birth/Sex: Treating RN: January 15, 1946 (76 y.o. [name] Primary Care Rizwan Kuyper: [name] Other Clinician: Referring Ebin Palazzi: Treating Santiaga Butzin/Extender: [name] in  Treatment: 2 Vital Signs Time Taken: 09:28 Temperature (F): 97.9 Height (in): 64 Pulse (bpm): 90 Weight (lbs): 111 Respiratory Rate (breaths/min): 16 Body Mass Index (BMI): 19.1 Blood Pressure (mmHg): 105/74 Reference Range: 80 - 120 mg / dl Electronic Signature(s) Signed: 10/06/2022 4:55:30 PM By: Adline Peals Entered By: Adline Peals on 10/06/2022 01:02:72

## 2022-10-09 NOTE — Progress Notes (Signed)
ISEL, SKUFCA ([id number]) [id number] Page 1 of 6 Visit Report for 10/06/2022 HPI Details Patient Name: Date of Service: [name], [name] 10/06/2022 9:15 A M Medical Record Number: [id number] Patient Account Number: [id number] Date of Birth/Sex: Treating RN: [birth year] (76 y.o. F) Primary Care Provider: [name] Other Clinician: Referring Provider: Treating Provider/Extender: [name] in Treatment: 2 History of Present Illness HPI Description: ADMISSION 09/20/2022 This is a 76 year old woman with a past medical history significant for congestive heart failure, atrial fibrillation, rheumatoid arthritis on long-term steroid treatment, CMML currently receiving chemotherapy and moderate malnutrition. She presents to clinic today with a wound on the dorsum of her right foot. She says it started out as a bruise and then developed a bump which subsequently broke open. She says she was sent to our clinic by her oncologist. She is not diabetic and does not smoke. ABI in clinic today was 1.55. She has had formal segmental arterial Dopplers done, a little over a year ago, which did not show any evidence of occlusive vascular disease. On the dorsal aspect of the right foot, there is a wound with a lot of old hematoma, slough, eschar, and and nonviable subcutaneous tissue. T the best of my o ability to discern, it does appear to involve the muscle layer, but there is no evidence of necrosis. The wound is pouring serous fluid; the patient has 3+ pitting edema to the bilateral lower extremities. 09/29/2022: The patient came to clinic today with nothing but a T overlying her wound. She is accompanied by her husband today. She has not elfa communicated with her cardiologist regarding her fluid overload. She still has 3+ pitting edema bilaterally. The wound is still pouring serous fluid, although not quite as effusively as at her initial  visit. There is still quite a bit of nonviable tissue and hematoma present. 10/13; patient's wound on the right lateral lower leg his eschared over and may well be on its way to healing. The real problem here is on the right dorsal foot just proximal to her toes. The patient is still puzzled by the cause of this. Looking at a picture on the patient's smart phone there is a suggestion that this might have been a hematoma which seems to fit what Dr. [name] had felt on her initial evaluation. They have been using iodoform packing kerlix and Coban. Electronic Signature(s) Signed: 10/08/2022 7:19:32 PM By: Linton Ham MD Entered By: Linton Ham on 10/06/2022 10:21:39 -------------------------------------------------------------------------------- Physical Exam Details Patient Name: Date of Service: Sherry Masson [name] 10/06/2022 9:15 A M Medical Record Number: [id number] Patient Account Number: [id number] Date of Birth/Sex: Treating RN: 11-Nov-1946 (76 y.o. F) Primary Care Provider: [name] Other Clinician: Referring Provider: Treating Provider/Extender: [name] in Treatment: 2 Constitutional Sitting or standing Blood Pressure is within target range for patient.. Pulse regular and within target range for patient.Marland Kitchen Respirations regular, non-labored and within target range.. Temperature is normal and within the target range for the patient.Marland Kitchen Appears in no distress. Notes Wound exam; punched out wound on the right dorsal foot just proximal to her toes. Fortunately the surface of this does not look too bad with reasonably healthy looking granulation but there is considerable undermining especially medially. The residual hematoma and necrotic tissue seems to have been previously resolved. No debridement was necessary there is no evidence of surrounding infection. Electronic Signature(s) Signed: 10/08/2022 7:19:32 PM By: Linton Ham MD Entered By:  Linton Ham on 10/06/2022  10:22:56 [name], [name] ([id number]) [id number] Page 2 of 6 -------------------------------------------------------------------------------- Physician Orders Details Patient Name: Date of Service: [name], [name] 10/06/2022 9:15 A M Medical Record Number: [id number] Patient Account Number: [id number] Date of Birth/Sex: Treating RN: June 10, 1946 (76 y.o. [name] Primary Care Provider: [name] Other Clinician: Referring Provider: Treating Provider/Extender: [name] in Treatment: 2 Verbal / Phone Orders: No Diagnosis Coding Follow-up Appointments ppointment in 1 week. - Dr. [name] Rm 4 Return A Nurse Visit: - Nurse visit rm 3 Anesthetic Wound #1 Right,Dorsal Foot (In clinic) Topical Lidocaine 5% applied to wound bed Bathing/ Shower/ Hygiene May shower with protection but do not get wound dressing(s) wet. Edema Control - Lymphedema / SCD / Other Left Lower Extremity Elevate legs to the level of the heart or above for 30 minutes daily and/or when sitting, a frequency of: Avoid standing for long periods of time. Patient to wear own compression stockings every day. Compression stocking or Garment 20-30 mm/Hg pressure to: - left leg Home Health dmit to Home Health for wound care. May utilize formulary equivalent dressing for wound treatment orders unless otherwise specified. - A For skilled nursing for wound care. New wound care orders this week; continue Home Health for wound care. May utilize formulary equivalent dressing for wound treatment orders unless otherwise specified. Wound Treatment Wound #1 - Foot Wound Laterality: Dorsal, Right Cleanser: Soap and Water 1 x Per Day/30 Days Discharge Instructions: May shower and wash wound with dial antibacterial soap and water prior to dressing change. Cleanser: Wound Cleanser 1 x Per Day/30 Days Discharge Instructions:  Cleanse the wound with wound cleanser prior to applying a clean dressing using gauze sponges, not tissue or cotton balls. Peri-Wound Care: Sween Lotion (Moisturizing lotion) 1 x Per Day/30 Days Discharge Instructions: Apply moisturizing lotion as directed Topical: Skintegrity Hydrogel 4 (oz) 1 x Per Day/30 Days Discharge Instructions: Apply hydrogel as directed Prim Dressing: Promogran Prisma Matrix, 4.34 (sq in) (silver collagen) 1 x Per Day/30 Days ary Discharge Instructions: Moisten collagen with saline or hydrogel Secondary Dressing: ABD Pad, 5x9 (Generic) 1 x Per Day/30 Days Discharge Instructions: Apply over primary dressing as directed. Secondary Dressing: Zetuvit Plus 4x8 in (Generic) 1 x Per Day/30 Days Discharge Instructions: Apply over primary dressing as directed. Secured With: Transpore Surgical Tape, 2x10 (in/yd) (Generic) 1 x Per Day/30 Days Discharge Instructions: Secure dressing with tape as directed. Compression Wrap: Kerlix Roll 4.5x3.1 (in/yd) (Generic) 1 x Per Day/30 Days Discharge Instructions: Apply Kerlix and Coban compression as directed. Compression Wrap: Coban Self-Adherent Wrap 4x5 (in/yd) (Generic) 1 x Per Day/30 Days Discharge Instructions: Apply over Kerlix as directed. Wound #2 - Lower Leg Wound Laterality: Right, Lateral Cleanser: Soap and Water 1 x Per Day/30 Days ELMA, SHANDS ([id number]) [id number].pdf Page 3 of 6 Discharge Instructions: May shower and wash wound with dial antibacterial soap and water prior to dressing change. Cleanser: Wound Cleanser 1 x Per Day/30 Days Discharge Instructions: Cleanse the wound with wound cleanser prior to applying a clean dressing using gauze sponges, not tissue or cotton balls. Peri-Wound Care: Sween Lotion (Moisturizing lotion) 1 x Per Day/30 Days Discharge Instructions: Apply moisturizing lotion as directed Topical: Skintegrity Hydrogel 4 (oz) 1 x Per Day/30 Days Discharge Instructions:  Apply hydrogel as directed Prim Dressing: Promogran Prisma Matrix, 4.34 (sq in) (silver collagen) 1 x Per Day/30 Days ary Discharge Instructions: Moisten collagen with saline or hydrogel Secondary Dressing: ABD Pad, 5x9 (Generic) 1 x Per Day/30  Days Discharge Instructions: Apply over primary dressing as directed. Secondary Dressing: Woven Gauze Sponge, Non-Sterile 4x4 in (Generic) 1 x Per Day/30 Days Discharge Instructions: Apply over primary dressing as directed. Secured With: Transpore Surgical Tape, 2x10 (in/yd) 1 x Per Day/30 Days Discharge Instructions: Secure dressing with tape as directed. Compression Wrap: Kerlix Roll 4.5x3.1 (in/yd) (Generic) 1 x Per Day/30 Days Discharge Instructions: Apply Kerlix and Coban compression as directed. Compression Wrap: Coban Self-Adherent Wrap 4x5 (in/yd) 1 x Per Day/30 Days Discharge Instructions: Apply over Kerlix as directed. Electronic Signature(s) Signed: 10/12/2022 4:29:29 PM By: Linton Ham MD Signed: 10/12/2022 5:12:18 PM By: Baruch Gouty RN, BSN Previous Signature: 10/06/2022 4:55:30 PM Version By: Adline Peals Previous Signature: 10/08/2022 7:19:32 PM Version By: Linton Ham MD Entered By: Baruch Gouty on 10/12/2022 12:34:51 -------------------------------------------------------------------------------- Problem List Details Patient Name: Date of Service: [name], Sherry [name] 10/06/2022 9:15 A M Medical Record Number: [id number] Patient Account Number: [id number] Date of Birth/Sex: Treating RN: 11/05/1946 (76 y.o. 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 09/20/2022 No Yes without evidence of necrosis L97.812 Non-pressure chronic  ulcer of other part of right lower leg with fat layer 09/20/2022 No Yes exposed O96.29 Chronic systolic (congestive) heart failure 09/20/2022 No Yes M06.9 Rheumatoid arthritis, unspecified 09/20/2022 No Yes Pukalani, Rylei H ([id number]) [phone].pdf Page 4 of 6 Z79.52 Long term (current) use of systemic steroids 09/20/2022 No Yes I70.90 Unspecified atherosclerosis 09/20/2022 No Yes 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 10/06/2022 9:15 A M Medical Record Number: [id number] Patient Account Number: [id number] Date of Birth/Sex: Treating RN: 03-Oct-1946 (76 y.o. F) Primary Care Provider: [name] Other Clinician: Referring Provider: Treating Provider/Extender: [name] in Treatment: 2 Subjective History of Present Illness (HPI) ADMISSION 09/20/2022 This is a 76 year old woman with a past medical history significant for congestive heart failure, atrial fibrillation, rheumatoid arthritis on long-term steroid treatment, CMML currently receiving chemotherapy and moderate malnutrition. She presents to clinic today with a wound on the dorsum of her right foot. She says it started out as a bruise and then developed a bump which subsequently broke open. She says she was sent to our clinic by her oncologist. She is not diabetic and does not smoke. ABI in clinic today was 1.55. She has had formal segmental arterial Dopplers done, a little over a year ago, which did not show any evidence of occlusive vascular disease. On the dorsal aspect of the right foot, there is a wound with a  lot of old hematoma, slough, eschar, and and nonviable subcutaneous tissue. T the best of my o ability to discern, it does appear to involve the muscle layer, but there is no evidence of necrosis. The wound is pouring serous fluid; the patient has 3+ pitting edema to the bilateral lower extremities. 09/29/2022: The patient came to clinic today with nothing but a T overlying her wound. She is accompanied by her husband today. She has not elfa communicated with her cardiologist regarding her fluid overload. She still has 3+ pitting edema bilaterally. The wound is still pouring serous  fluid, although not quite as effusively as at her initial visit. There is still quite a bit of nonviable tissue and hematoma present. 10/13; patient's wound on the right lateral lower leg his eschared over and may well be on its way to healing. The real problem here is on the right dorsal foot just proximal to her toes. The patient is still puzzled by the cause of this. Looking at a picture on the patient's smart phone there is a suggestion that this might have been a hematoma which seems to fit what Dr. [name] had felt on her initial evaluation. They have been using iodoform packing kerlix and Coban. Objective Constitutional Sitting or standing Blood Pressure is within target range for patient.. Pulse regular and within target range for patient.Marland Kitchen Respirations regular, non-labored and within target range.. Temperature is normal and within the target range for the patient.Marland Kitchen Appears in no distress. Vitals Time Taken: 9:28 AM, Height: 64 in, Weight: 111 lbs, BMI: 19.1, Temperature: 97.9 F, Pulse: 90 bpm, Respiratory Rate: 16 breaths/min, Blood Pressure: 105/74 mmHg. [name], [name] ([id number]) [id number] Page 5 of 6 General Notes: Wound exam; punched out wound on the right dorsal foot just proximal to her toes. Fortunately the surface of this does not look too bad with reasonably healthy  looking granulation but there is considerable undermining especially medially. The residual hematoma and necrotic tissue seems to have been previously resolved. No debridement was necessary there is no evidence of surrounding infection. Integumentary (Hair, Skin) Wound #1 status is Open. Original cause of wound was Blister. The date acquired was: 08/23/2022. The wound has been in treatment 2 weeks. The wound is located on the Right,Dorsal Foot. The wound measures 1.5cm length x 1.1cm width x 0.7cm depth; 1.296cm^2 area and 0.907cm^3 volume. There is Fat Layer (Subcutaneous Tissue) exposed. There is no tunneling noted, however, there is undermining starting at 12:00 and ending at 12:00 with a maximum distance of 0.9cm. There is a medium amount of serosanguineous drainage noted. The wound margin is distinct with the outline attached to the wound base. There is large (67-100%) red, pink granulation within the wound bed. There is a small (1-33%) amount of necrotic tissue within the wound bed including Adherent Slough. The periwound skin appearance had no abnormalities noted for texture. The periwound skin appearance had no abnormalities noted for moisture. The periwound skin appearance had no abnormalities noted for color. Periwound temperature was noted as No Abnormality. Wound #2 status is Open. Original cause of wound was Skin T ear/Laceration. The date acquired was: 09/20/2022. The wound has been in treatment 2 weeks. The wound is located on the Right,Lateral Lower Leg. The wound measures 0.1cm length x 0.1cm width x 0.1cm depth; 0.008cm^2 area and 0.001cm^3 volume. There is Fat Layer (Subcutaneous Tissue) exposed. There is no tunneling or undermining noted. There is a none present amount of drainage noted. The wound margin is distinct with the outline attached to the wound base. There is small (1-33%) red granulation within the wound bed. There is a large (67-100%) amount of necrotic tissue within the  wound bed including Eschar. The periwound skin appearance had no abnormalities noted for texture. The periwound skin appearance had no abnormalities noted for moisture. The periwound skin appearance had no abnormalities noted for color. Periwound temperature was noted as No Abnormality. 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 - Dr. Alvera Singh 4 Nurse Visit: - Nurse visit rm 3 Anesthetic: Wound #1 Right,Dorsal Foot: (In clinic) Topical Lidocaine 5% applied to wound bed Bathing/ Shower/ Hygiene: May shower with protection but do not get wound dressing(s) wet. Edema Control - Lymphedema / SCD / Other: Elevate legs to the level of the heart or above for 30 minutes daily and/or when sitting, a frequency of: Avoid standing for long periods of time. Patient to wear own compression stockings every day. Compression stocking or Garment 20-30 mm/Hg pressure to: - left leg Home Health: Admit to [facility] for wound care. May utilize formulary equivalent dressing for wound treatment orders unless otherwise specified. New wound care orders this week; continue Home Health for wound care. May utilize formulary equivalent dressing for wound treatment orders unless otherwise specified. WOUND #1: - Foot Wound Laterality: Dorsal, Right Cleanser: Soap and Water 1 x Per Day/30 Days Discharge Instructions: May shower and wash wound with dial antibacterial soap and water prior to dressing change. Cleanser: Wound Cleanser 1 x Per Day/30 Days Discharge  Instructions: Cleanse the wound with wound cleanser prior to applying a clean dressing using gauze sponges, not tissue or cotton balls. Peri-Wound Care: Sween Lotion (Moisturizing lotion) 1 x Per Day/30 Days Discharge Instructions: Apply moisturizing lotion as directed Topical: Skintegrity Hydrogel 4 (oz) 1 x Per Day/30 Days Discharge Instructions: Apply hydrogel as directed Prim Dressing: Promogran Prisma Matrix, 4.34 (sq in) (silver collagen) 1 x Per Day/30 Days ary Discharge Instructions: Moisten collagen with saline or hydrogel Secondary Dressing: ABD Pad, 5x9 (Generic) 1 x Per Day/30 Days Discharge Instructions: Apply over primary dressing as directed. Secondary Dressing: Zetuvit Plus 4x8 in (Generic) 1 x Per Day/30 Days Discharge Instructions: Apply over primary dressing as directed. Secured With: Transpore Surgical T ape, 2x10 (in/yd) (Generic) 1 x Per Day/30 Days Discharge Instructions: Secure dressing with tape as directed. Com pression Wrap: Kerlix Roll 4.5x3.1 (in/yd) (Generic) 1 x Per Day/30 Days Discharge Instructions: Apply Kerlix and Coban compression as directed. Com pression Wrap: Coban Self-Adherent Wrap 4x5 (in/yd) (Generic) 1 x Per Day/30 Days Discharge Instructions: Apply over Kerlix as directed. WOUND #2: - Lower Leg Wound Laterality: Right, Lateral Cleanser: Soap and Water 1 x Per Day/30 Days Discharge Instructions: May shower and wash wound with dial antibacterial soap and water prior to dressing change. Cleanser: Wound Cleanser 1 x Per Day/30 Days Discharge Instructions: Cleanse the wound with wound cleanser prior to applying a clean dressing using gauze sponges, not tissue or cotton balls. Peri-Wound Care: Sween Lotion (Moisturizing lotion) 1 x Per Day/30 Days Discharge Instructions: Apply moisturizing lotion as directed [name], [name] ([id number]) [phone].pdf Page 6 of 6 Topical: Skintegrity Hydrogel 4 (oz) 1 x Per Day/30  Days Discharge Instructions: Apply hydrogel as directed Prim Dressing: Promogran Prisma Matrix, 4.34 (sq in) (silver collagen) 1 x Per Day/30 Days ary Discharge Instructions: Moisten collagen with saline or hydrogel Secondary Dressing: ABD Pad, 5x9 (Generic) 1 x Per Day/30 Days Discharge Instructions: Apply over primary dressing as directed. Secondary Dressing: Woven Gauze Sponge, Non-Sterile 4x4 in (Generic) 1 x Per Day/30 Days Discharge Instructions: Apply over primary dressing as directed. Secured With: Transpore Surgical T ape, 2x10 (in/yd) 1 x Per Day/30 Days Discharge Instructions: Secure dressing with tape as directed. Com pression Wrap: Kerlix  Roll 4.5x3.1 (in/yd) (Generic) 1 x Per Day/30 Days Discharge Instructions: Apply Kerlix and Coban compression as directed. Com pression Wrap: Coban Self-Adherent Wrap 4x5 (in/yd) 1 x Per Day/30 Days Discharge Instructions: Apply over Kerlix as directed. 1. I change the primary dressing to Prisma hydrogel hopefully to stimulate some more granulation 2. A snap VAC through her insurance. The hope year would be 8 to stimulate granulation especially in the undermining areas 3. Fortunately no need for debridement today and no evidence of infection from that point of view the wound seems better. Electronic Signature(s) Signed: 10/08/2022 7:19:32 PM By: Linton Ham MD Entered By: Linton Ham on 10/06/2022 10:24:07 -------------------------------------------------------------------------------- SuperBill Details Patient Name: Date of Service: Sherry Masson RO Ovidio Hanger 10/06/2022 Medical Record Number: [id number] Patient Account Number: [id number] Date of Birth/Sex: Treating RN: 05/11/1946 (77 y.o. 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 Facility Procedures : CPT4 Code: [id number] Description: [place] VISIT-LEV 3 EST PT Modifier: Quantity: 1 Physician Procedures : CPT4 Code Description Modifier [id number] - WC PHYS LEVEL 3 - EST PT ICD-10 Diagnosis Description 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

## 2022-10-10 ENCOUNTER — Encounter (HOSPITAL_BASED_OUTPATIENT_CLINIC_OR_DEPARTMENT_OTHER): Payer: Medicare Other | Admitting: General Surgery

## 2022-10-10 DIAGNOSIS — E11621 Type 2 diabetes mellitus with foot ulcer: Secondary | ICD-10-CM | POA: Diagnosis not present

## 2022-10-10 DIAGNOSIS — L97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 evidence of necrosis: Secondary | ICD-10-CM | POA: Diagnosis not present

## 2022-10-10 DIAGNOSIS — M069 Rheumatoid arthritis, unspecified: Secondary | ICD-10-CM | POA: Diagnosis not present

## 2022-10-10 DIAGNOSIS — E1122 Type 2 diabetes mellitus with diabetic chronic kidney disease: Secondary | ICD-10-CM | POA: Diagnosis not present

## 2022-10-10 DIAGNOSIS — E11622 Type 2 diabetes mellitus with other skin ulcer: Secondary | ICD-10-CM | POA: Diagnosis not present

## 2022-10-10 DIAGNOSIS — L97812 Non-pressure chronic ulcer of other part of right lower leg with fat layer exposed: Secondary | ICD-10-CM | POA: Diagnosis not present

## 2022-10-11 NOTE — Progress Notes (Signed)
[name], [name] ([id number]) [id number] Page 1 of 1 Visit Report for 10/10/2022 SuperBill Details Patient Name: Date of Service: [name], [name] 10/10/2022 Medical Record Number: [id number] Patient Account Number: [id number] Date of Birth/Sex: Treating RN: 1946-02-13 (76 y.o. 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

## 2022-10-11 NOTE — Progress Notes (Signed)
[name], [name] ([id number]) [id number] Page 1 of 6 Visit Report for 10/10/2022 Arrival Information Details Patient Name: Date of Service: [name], [name] 10/10/2022 11:30 A M Medical Record Number: [id number] Patient Account Number: [id number] Date of Birth/Sex: Treating RN: [birth year] (76 y.o. [name] Primary Care Floyd Lusignan: [name] Other Clinician: Referring Manoj Enriquez: Treating Jasmin Winberry/Extender: [name] in Treatment: 2 Visit Information History Since Last Visit Added or deleted any medications: No Patient Arrived: Wheel Chair Any new allergies or adverse reactions: No Arrival Time: 11:45 Had a fall or experienced change in No Accompanied By: spouse activities of daily living that may affect Transfer Assistance: None risk of falls: Patient Identification Verified: Yes Signs or symptoms of abuse/neglect since last visito No Secondary Verification Process Completed: Yes Hospitalized since last visit: No Patient Requires Transmission-Based Precautions: No Implantable device outside of the clinic excluding No Patient Has Alerts: No cellular tissue based products placed in the center since last visit: Has Dressing in Place as Prescribed: Yes Has Compression in Place as Prescribed: Yes Pain Present Now: Yes Electronic Signature(s) Signed: 10/10/2022 5:34:41 PM By: Baruch Gouty RN, BSN Entered By: Baruch Gouty on 10/10/2022 11:48:33 -------------------------------------------------------------------------------- Clinic Level of Care Assessment Details Patient Name: Date of Service: [name], Sherry RO N [name]. 10/10/2022 11:30 A M Medical Record Number: [id number] Patient Account Number: [id number] Date of Birth/Sex: Treating RN: [birth year] (76 y.o. [name] Primary Care Terrianna Holsclaw: [name] Other Clinician: Referring Ladeana Laplant: Treating Andrina Locken/Extender: [name] in Treatment: 2 Clinic Level of Care Assessment Items TOOL 4 Quantity Score []  - 0 Use when only an EandM is performed on FOLLOW-UP visit ASSESSMENTS - Nursing Assessment / Reassessment X- 1 10 Reassessment of Co-morbidities (includes updates in patient status) X- 1 5 Reassessment of Adherence to Treatment Plan ASSESSMENTS - Wound and Skin A ssessment / Reassessment X - Simple Wound Assessment / Reassessment - one wound 1 5 []  - 0 Complex Wound Assessment / Reassessment - multiple wounds []  - 0 Dermatologic / Skin Assessment (not related to wound area) ASSESSMENTS - Focused Assessment []  - 0 Circumferential Edema Measurements - multi extremities []  - 0 Nutritional Assessment / Counseling / Intervention ERIEL, DUNCKEL ([id number]) [id number] Page 2 of 6 []  - 0 Lower Extremity Assessment (monofilament, tuning fork, pulses) []  - 0 Peripheral Arterial Disease Assessment (using hand held doppler) ASSESSMENTS - Ostomy and/or Continence Assessment and Care []  - 0 Incontinence Assessment and Management []  - 0 Ostomy Care Assessment and Management (repouching, etc.) PROCESS - Coordination of Care X - Simple Patient / Family Education for ongoing care 1 15 []  - 0 Complex (extensive) Patient / Family Education for ongoing care X- 1 10 Staff obtains Programmer, systems, Records, T Results / Process Orders est []  - 0 Staff telephones HHA, Nursing Homes / Clarify orders / etc []  - 0 Routine Transfer to another Facility (non-emergent condition) []  - 0 Routine Hospital Admission (non-emergent condition) []  - 0 New Admissions / Biomedical engineer / Ordering NPWT Apligraf, etc. , []  - 0 Emergency Hospital Admission (emergent condition) X- 1 10 Simple Discharge Coordination []  - 0 Complex (extensive) Discharge Coordination PROCESS - Special Needs []  - 0 Pediatric / Minor Patient Management []  - 0 Isolation Patient Management []  - 0 Hearing  / Language / Visual special needs []  - 0 Assessment of Community assistance (transportation, D/C planning, etc.) []  - 0 Additional assistance / Altered mentation []  - 0 Support Surface(s) Assessment (bed, cushion, seat,  etc.) INTERVENTIONS - Wound Cleansing / Measurement X - Simple Wound Cleansing - one wound 1 5 []  - 0 Complex Wound Cleansing - multiple wounds X- 1 5 Wound Imaging (photographs - any number of wounds) []  - 0 Wound Tracing (instead of photographs) []  - 0 Simple Wound Measurement - one wound []  - 0 Complex Wound Measurement - multiple wounds INTERVENTIONS - Wound Dressings []  - 0 Small Wound Dressing one or multiple wounds X- 1 15 Medium Wound Dressing one or multiple wounds []  - 0 Large Wound Dressing one or multiple wounds []  - 0 Application of Medications - topical []  - 0 Application of Medications - injection INTERVENTIONS - Miscellaneous []  - 0 External ear exam []  - 0 Specimen Collection (cultures, biopsies, blood, body fluids, etc.) []  - 0 Specimen(s) / Culture(s) sent or taken to Lab for analysis []  - 0 Patient Transfer (multiple staff / Civil Service fast streamer / Similar devices) []  - 0 Simple Staple / Suture removal (25 or less) []  - 0 Complex Staple / Suture removal (26 or more) []  - 0 Hypo / Hyperglycemic Management (close monitor of Blood Glucose) [name], [name] ([id number]) [id number] Page 3 of 6 []  - 0 Ankle / Brachial Index (ABI) - do not check if billed separately X- 1 5 Vital Signs Has the patient been seen at the hospital within the last three years: Yes Total Score: 85 Level Of Care: New/Established - Level 3 Electronic Signature(s) Signed: 10/10/2022 5:34:41 PM By: Baruch Gouty RN, BSN Entered By: Baruch Gouty on 10/10/2022 12:07:07 -------------------------------------------------------------------------------- Encounter Discharge Information Details Patient Name: Date of Service: Sherry Masson RO N [name].  10/10/2022 11:30 A M Medical Record Number: [id number] Patient Account Number: [id number] Date of Birth/Sex: Treating RN: [birth year] (76 y.o. [name] Primary Care Raia Amico: [name] Other Clinician: Referring Celia Friedland: Treating Justine Dines/Extender: [name] in Treatment: 2 Encounter Discharge Information Items Discharge Condition: Stable Ambulatory Status: Wheelchair Discharge Destination: Home Transportation: Private Auto Accompanied By: spouse Schedule Follow-up Appointment: Yes Clinical Summary of Care: Patient Declined Electronic Signature(s) Signed: 10/10/2022 5:34:41 PM By: Baruch Gouty RN, BSN Entered By: Baruch Gouty on 10/10/2022 12:08:04 -------------------------------------------------------------------------------- Patient/Caregiver Education Details Patient Name: Date of Service: [name] 10/17/2023andnbsp11:30 A M Medical Record Number: [id number] Patient Account Number: [id number] Date of Birth/Gender: Treating RN: 10/25/1946 (76 y.o. [name] Primary Care Physician: [name] Other Clinician: Referring Physician: Treating Physician/Extender: [name] in Treatment: 2 Education Assessment Education Provided To: Patient Education Topics Provided Venous: Methods: Explain/Verbal Responses: Reinforcements needed, State content correctly Electronic Signature(s) Signed: 10/10/2022 5:34:41 PM By: Baruch Gouty RN, BSN Lavina, Morton [name] ([id number]) [id number] Page 4 of 6 Entered By: Baruch Gouty on 10/10/2022 12:07:50 -------------------------------------------------------------------------------- Wound Assessment Details Patient Name: Date of Service: [name], [name] 10/10/2022 11:30 A M Medical Record Number: [id number] Patient Account Number: [id number] Date of Birth/Sex: Treating RN: December 13, 1946 (76 y.o. [name],  [name] Primary Care Skilynn Durney: [name] Other Clinician: Referring Hady Niemczyk: Treating Emika Tiano/Extender: [name] in Treatment: 2 Wound Status Wound Number: 1 Primary Etiology: Lymphedema Wound Location: Right, Dorsal Foot Wound Status: Open Wounding Event: Blister Date Acquired: 08/23/2022 Weeks Of Treatment: 2 Clustered Wound: No Wound Measurements Length: (cm) 1.5 Width: (cm) 1.1 Depth: (cm) 0.7 Area: (cm) 1.296 Volume: (cm) 0.907 % Reduction in Area: -25% % Reduction in Volume: -45.8% Wound Description Classification: Full Thickness Without Exposed Suppor Exudate Amount: Medium Exudate Type: Serosanguineous Exudate Color: red, brown  t Structures Periwound Skin Texture Texture Color No Abnormalities Noted: No No Abnormalities Noted: No Moisture No Abnormalities Noted: No Treatment Notes Wound #1 (Foot) Wound Laterality: Dorsal, Right Cleanser Soap and Water Discharge Instruction: May shower and wash wound with dial antibacterial soap and water prior to dressing change. Wound Cleanser Discharge Instruction: Cleanse the wound with wound cleanser prior to applying a clean dressing using gauze sponges, not tissue or cotton balls. Peri-Wound Care Sween Lotion (Moisturizing lotion) Discharge Instruction: Apply moisturizing lotion as directed Topical Skintegrity Hydrogel 4 (oz) Discharge Instruction: Apply hydrogel as directed Primary Dressing Promogran Prisma Matrix, 4.34 (sq in) (silver collagen) Discharge Instruction: Moisten collagen with saline or hydrogel Secondary Dressing Zetuvit Plus 4x8 in Discharge Instruction: Apply over primary dressing as directed. MATISON, NUCCIO ([id number]) [id number] Page 5 of 6 Secured With Compression Wrap Kerlix Roll 4.5x3.1 (in/yd) Discharge Instruction: Apply Kerlix and Coban compression as directed. Coban Self-Adherent Wrap 4x5 (in/yd) Discharge  Instruction: Apply over Kerlix as directed. Compression Stockings Add-Ons Electronic Signature(s) Signed: 10/10/2022 5:34:41 PM By: Baruch Gouty RN, BSN Entered By: Baruch Gouty on 10/10/2022 11:52:38 -------------------------------------------------------------------------------- Wound Assessment Details Patient Name: Date of Service: Sherry Masson RO N [name]. 10/10/2022 11:30 A M Medical Record Number: [id number] Patient Account Number: [id number] Date of Birth/Sex: Treating RN: January 31, 1946 (76 y.o. [name], [name] Primary Care Ijanae Macapagal: [name] Other Clinician: Referring [name]: Treating [name]/Extender: [name] in Treatment: 2 Wound Status Wound Number: 2 Primary Etiology: Abrasion Wound Location: Right, Lateral Lower Leg Wound Status: Open Wounding Event: Skin Tear/Laceration Date Acquired: 09/20/2022 Weeks Of Treatment: 2 Clustered Wound: No Wound Measurements Length: (cm) 0.1 Width: (cm) 0.1 Depth: (cm) 0.1 Area: (cm) 0.008 Volume: (cm) 0.001 % Reduction in Area: 98.9% % Reduction in Volume: 98.6% Wound Description Classification: Full Thickness Without Exposed Support S Exudate Amount: None Present tructures Periwound Skin Texture Texture Color No Abnormalities Noted: No No Abnormalities Noted: No Moisture No Abnormalities Noted: No Treatment Notes Wound #2 (Lower Leg) Wound Laterality: Right, Lateral Cleanser Soap and Water Discharge Instruction: May shower and wash wound with dial antibacterial soap and water prior to dressing change. Wound Cleanser Discharge Instruction: Cleanse the wound with wound cleanser prior to applying a clean dressing using gauze sponges, not tissue or cotton balls. Peri-Wound Care Sween Lotion (Moisturizing lotion) Discharge Instruction: Apply moisturizing lotion as directed [name], [name] ([id number]) [phone].pdf Page 6 of 6 Topical Skintegrity  Hydrogel 4 (oz) Discharge Instruction: Apply hydrogel as directed Primary Dressing Promogran Prisma Matrix, 4.34 (sq in) (silver collagen) Discharge Instruction: Moisten collagen with saline or hydrogel Secondary Dressing Woven Gauze Sponge, Non-Sterile 4x4 in Discharge Instruction: Apply over primary dressing as directed. Secured With Compression Wrap Kerlix Roll 4.5x3.1 (in/yd) Discharge Instruction: Apply Kerlix and Coban compression as directed. Coban Self-Adherent Wrap 4x5 (in/yd) Discharge Instruction: Apply over Kerlix as directed. Compression Stockings Add-Ons Electronic Signature(s) Signed: 10/10/2022 5:34:41 PM By: Baruch Gouty RN, BSN Entered By: Baruch Gouty on 10/10/2022 11:52:38 -------------------------------------------------------------------------------- Vitals Details Patient Name: Date of Service: Sherry Masson RO N [name]. 10/10/2022 11:30 A M Medical Record Number: [id number] Patient Account Number: [id number] Date of Birth/Sex: Treating RN: September 25, 1946 (76 y.o. [name] Primary Care Cameren Odwyer: [name] Other Clinician: Referring [name]: Treating Kazim Corrales/Extender: [name] in Treatment: 2 Vital Signs Time Taken: 11:48 Temperature (F): 98 Height (in): 64 Pulse (bpm): 71 Weight (lbs): 111 Respiratory Rate (breaths/min): 18 Body Mass Index (BMI): 19.1 Blood Pressure (mmHg): 97/68 Reference Range: 80 - 120 mg /  dl Electronic Signature(s) Signed: 10/10/2022 5:34:41 PM By: Baruch Gouty RN, BSN Entered By: Baruch Gouty on 10/10/2022 11:48:51

## 2022-10-13 ENCOUNTER — Encounter (HOSPITAL_BASED_OUTPATIENT_CLINIC_OR_DEPARTMENT_OTHER): Payer: Medicare Other | Admitting: Internal Medicine

## 2022-10-13 DIAGNOSIS — E11622 Type 2 diabetes mellitus with other skin ulcer: Secondary | ICD-10-CM | POA: Diagnosis not present

## 2022-10-13 DIAGNOSIS — E11621 Type 2 diabetes mellitus with foot ulcer: Secondary | ICD-10-CM | POA: Diagnosis not present

## 2022-10-13 DIAGNOSIS — S81811A Laceration without foreign body, right lower leg, initial encounter: Secondary | ICD-10-CM | POA: Diagnosis not present

## 2022-10-13 DIAGNOSIS — E1122 Type 2 diabetes mellitus with diabetic chronic kidney disease: Secondary | ICD-10-CM | POA: Diagnosis not present

## 2022-10-13 DIAGNOSIS — M069 Rheumatoid arthritis, unspecified: Secondary | ICD-10-CM | POA: Diagnosis not present

## 2022-10-13 DIAGNOSIS — L97512 Non-pressure chronic ulcer of other part of right foot with fat layer exposed: Secondary | ICD-10-CM | POA: Diagnosis not present

## 2022-10-13 DIAGNOSIS — L97812 Non-pressure chronic ulcer of other part of right lower leg with fat layer exposed: Secondary | ICD-10-CM | POA: Diagnosis not present

## 2022-10-13 DIAGNOSIS — L97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 evidence of necrosis: Secondary | ICD-10-CM | POA: Diagnosis not present

## 2022-10-13 NOTE — Progress Notes (Signed)
SUELLYN, MEENAN ([id number]) [id number] Page 1 of 9 Visit Report for 10/13/2022 Arrival Information Details Patient Name: Date of Service: [name], [name] 10/13/2022 12:30 PM Medical Record Number: [id number] Patient Account Number: [id number] Date of Birth/Sex: Treating RN: November 24, 1946 (76 y.o. [name] Primary Care Macaela Presas: [name] Other Clinician: Referring Anayelli Lai: Treating Tanara Turvey/Extender: [name] in Treatment: 3 Visit Information History Since Last Visit Added or deleted any medications: No Patient Arrived: Wheel Chair Any new allergies or adverse reactions: No Arrival Time: 12:38 Had a fall or experienced change in No Accompanied By: self activities of daily living that may affect Transfer Assistance: None risk of falls: Patient Identification Verified: Yes Signs or symptoms of abuse/neglect since last visito No Secondary Verification Process Completed: Yes Hospitalized since last visit: No Patient Requires Transmission-Based Precautions: No Implantable device outside of the clinic excluding No Patient Has Alerts: No cellular tissue based products placed in the center since last visit: Has Dressing in Place as Prescribed: Yes Has Compression in Place as Prescribed: Yes Pain Present Now: Yes Electronic Signature(s) Signed: 10/13/2022 3:35:50 PM By: Adline Peals Entered By: Adline Peals on 10/13/2022 12:39:15 -------------------------------------------------------------------------------- Encounter Discharge Information Details Patient Name: Date of Service: [place], [place]. 10/13/2022 12:30 PM Medical Record Number: [id number] Patient Account Number: [id number] Date of Birth/Sex: Treating RN: 1946/05/18 (76 y.o. [name] Primary Care Lokelani Lutes: [name] Other Clinician: Referring Bran Aldridge: Treating Jaicob Dia/Extender: [name] in Treatment: 3 Encounter Discharge Information Items Discharge Condition: Stable Ambulatory Status: Wheelchair Discharge Destination: Home Transportation: Private Auto Accompanied By: self Schedule Follow-up Appointment: Yes Clinical Summary of Care: Patient Declined Electronic Signature(s) Signed: 10/13/2022 3:35:50 PM By: Sabas Sous By: Adline Peals on 10/13/2022 13:43:54 Weich, [name] ([id number]) [id number] Page 2 of 9 -------------------------------------------------------------------------------- Lower Extremity Assessment Details Patient Name: Date of Service: CALIANNA, KIM 10/13/2022 12:30 PM Medical Record Number: [id number] Patient Account Number: [id number] Date of Birth/Sex: Treating RN: 02-06-1946 (76 y.o. [name] Primary Care Nthony Lefferts: [name] Other Clinician: Referring Ruble Buttler: Treating Bindu Docter/Extender: [name] in Treatment: 3 Edema Assessment Assessed: Shirlyn Goltz: No] [Right: No] [Left: Edema] [Right: :] Calf Left: Right: Point of Measurement: From Medial Instep 25.5 cm Ankle Left: Right: Point of Measurement: From Medial Instep 18.1 cm Vascular Assessment Pulses: Dorsalis Pedis Palpable: [Right:Yes] Electronic Signature(s) Signed: 10/13/2022 3:35:50 PM By: Adline Peals Entered By: Adline Peals on 10/13/2022 12:45:51 -------------------------------------------------------------------------------- Multi Wound Chart Details Patient Name: Date of Service: [name] 10/13/2022 12:30 PM Medical Record Number: [id number] Patient Account Number: [id number] Date of Birth/Sex: Treating RN: 1946-09-25 (76 y.o. F) Primary Care Renso Swett: [name] Other Clinician: Referring Tate Jerkins: Treating Sherene Plancarte/Extender: [name] in Treatment: 3 Vital Signs Height(in): 64 Pulse(bpm):  103 Weight(lbs): 111 Blood Pressure(mmHg): 103/70 Body Mass Index(BMI): 19.1 Temperature(F): 98.3 Respiratory Rate(breaths/min): 18 [1:Photos:] [N/A:N/A [id number] Page 3 of 9] Right, Dorsal Foot Right, Lateral Lower Leg N/A Wound Location: Blister Skin T ear/Laceration N/A Wounding Event: Lymphedema Abrasion N/A Primary Etiology: Asthma, Chronic Obstructive Asthma, Chronic Obstructive N/A Comorbid History: Pulmonary Disease (COPD), Pulmonary Disease (COPD), Congestive Heart Failure, Congestive Heart Failure, Hypertension, Rheumatoid Arthritis, Hypertension, Rheumatoid Arthritis, Neuropathy, Received Chemotherapy Neuropathy, Received Chemotherapy 08/23/2022 09/20/2022 N/A Date Acquired: 3 3 N/A Weeks of Treatment: Open Open N/A Wound Status: No No N/A Wound Recurrence: 1.3x1.2x0.8 0x0x0 N/A Measurements L x W x D (cm) 1.225 0 N/A A (cm) : rea  0.98 0 N/A Volume (cm) : -18.10% 100.00% N/A % Reduction in A rea: -57.60% 100.00% N/A % Reduction in Volume: 12 Starting Position 1 (o'clock): 6 Ending Position 1 (o'clock): 1.3 Maximum Distance 1 (cm): Yes No N/A Undermining: Full Thickness Without Exposed Full Thickness Without Exposed N/A Classification: Support Structures Support Structures Medium None Present N/A Exudate Amount: Serosanguineous N/A N/A Exudate Type: red, brown N/A N/A Exudate Color: Distinct, outline attached Distinct, outline attached N/A Wound Margin: Medium (34-66%) None Present (0%) N/A Granulation Amount: Red N/A N/A Granulation Quality: Medium (34-66%) None Present (0%) N/A Necrotic Amount: Fat Layer (Subcutaneous Tissue): Yes Fascia: No N/A Exposed Structures: Fascia: No Fat Layer (Subcutaneous Tissue): No Tendon: No Tendon: No Muscle: No Muscle: No Joint: No Joint: No Bone: No Bone: No None None N/A Epithelialization: No Abnormalities Noted No Abnormalities Noted N/A Periwound Skin  Texture: No Abnormalities Noted No Abnormalities Noted N/A Periwound Skin Moisture: Erythema: Yes No Abnormalities Noted N/A Periwound Skin Color: No Abnormality No Abnormality N/A Temperature: Treatment Notes Electronic Signature(s) Signed: 10/13/2022 4:47:23 PM By: Linton Ham MD Entered By: Linton Ham on 10/13/2022 13:16:18 -------------------------------------------------------------------------------- Multi-Disciplinary Care Plan Details Patient Name: Date of Service: SHAKERRIA, PARRAN [name] 10/13/2022 12:30 PM Medical Record Number: [id number] Patient Account Number: [id number] Date of Birth/Sex: Treating RN: Aug 29, 1946 (76 y.o. [name] Primary Care Tiegan Terpstra: [name] Other Clinician: Referring Aailyah Dunbar: Treating Jermane Brayboy/Extender: [name] in Treatment: 3 Active Inactive Peripheral Neuropathy Nursing Diagnoses: Potential alteration in peripheral tissue perfusion (select prior to confirmation of diagnosis) Goals: Patient/caregiver will verbalize understanding of disease process and disease management Date Initiated: 09/20/2022 Target Resolution Date: 11/24/2022 Goal Status: Active DANNELL, RACZKOWSKI ([id number]) [id number] Page 4 of 9 Interventions: Provide education on Management of Neuropathy and Related Ulcers Treatment Activities: Patient referred for customized footwear/offloading : 09/20/2022 Notes: Wound/Skin Impairment Nursing Diagnoses: Knowledge deficit related to ulceration/compromised skin integrity Goals: Ulcer/skin breakdown will have a volume reduction of 30% by week 4 Date Initiated: 09/20/2022 Target Resolution Date: 11/24/2022 Goal Status: Active Interventions: Assess ulceration(s) every visit Treatment Activities: Skin care regimen initiated : 09/20/2022 Topical wound management initiated : 09/20/2022 Notes: Electronic Signature(s) Signed: 10/13/2022 3:35:50 PM By:  Adline Peals Entered By: Adline Peals on 10/13/2022 13:09:03 -------------------------------------------------------------------------------- Negative Pressure Wound Therapy Application (NPWT) Details Patient Name: Date of Service: MILLA, WAHLBERG 10/13/2022 12:30 PM Medical Record Number: [id number] Patient Account Number: [id number] Date of Birth/Sex: Treating RN: June 08, 1946 (76 y.o. [name] Primary Care Sophiagrace Benbrook: [name] Other Clinician: Referring Melika Reder: Treating Atleigh Gruen/Extender: [name] in Treatment: 3 NPWT Application Performed for: Wound #1 Right, Dorsal Foot Performed By: Adline Peals, RN Type: VAC System Coverage Size (sq cm): 1.56 Pressure Type: Constant Pressure Setting: 125 mmHG Drain Type: None Quantity of Sponges/Gauze Inserted: 1 Sponge/Dressing Type: Foam, Black Date Initiated: 10/13/2022 Post Procedure Diagnosis Same as Pre-procedure Electronic Signature(s) Signed: 10/13/2022 3:35:50 PM By: Adline Peals Entered By: Adline Peals on 10/13/2022 13:42:12 Pain Assessment Details -------------------------------------------------------------------------------- [name] ([id number]) [id number] Page 5 of 9 Patient Name: Date of Service: SHAMARRA, WARDA 10/13/2022 12:30 PM Medical Record Number: [id number] Patient Account Number: [id number] Date of Birth/Sex: Treating RN: [birth year] (76 y.o. [name] Primary Care Arbell Wycoff: [name] Other Clinician: Referring Dyllin Gulley: Treating [name]/Extender: [name] in Treatment: 3 Active Problems Location of Pain Severity and Description of Pain Patient Has Paino Yes Site Locations Pain Location: Pain in Ulcers Duration of  the Pain. Constant / Intermittento Intermittent Rate the pain. Current Pain Level: 3 Character of Pain Describe the Pain:  Burning Pain Management and Medication Current Pain Management: Electronic Signature(s) Signed: 10/13/2022 3:35:50 PM By: Adline Peals Entered By: Adline Peals on 10/13/2022 12:39:35 -------------------------------------------------------------------------------- Patient/Caregiver Education Details Patient Name: Date of Service: Alroy Dust 10/20/2023andnbsp12:30 PM Medical Record Number: [id number] Patient Account Number: [id number] Date of Birth/Gender: Treating RN: 1946-09-20 (76 y.o. [name] Primary Care Physician: [name] Other Clinician: Referring Physician: Treating Physician/Extender: [name] in Treatment: 3 Education Assessment Education Provided To: Patient Education Topics Provided Wound/Skin Impairment: Methods: Explain/Verbal Responses: Reinforcements needed, State content correctly Electronic Signature(s) Signed: 10/13/2022 3:35:50 PM By: Adline Peals Entered By: Adline Peals on 10/13/2022 13:09:15 [place], [name] ([id number]) [id number] Page 6 of 9 -------------------------------------------------------------------------------- Wound Assessment Details Patient Name: Date of Service: HAMDA, KLUTTS 10/13/2022 12:30 PM Medical Record Number: [id number] Patient Account Number: [id number] Date of Birth/Sex: Treating RN: [birth year] (76 y.o. [name] Primary Care Vong Garringer: [name] Other Clinician: Referring Moet Mikulski: Treating Ranferi Clingan/Extender: [name] in Treatment: 3 Wound Status Wound Number: 1 Primary Lymphedema Etiology: Wound Location: Right, Dorsal Foot Wound Open Wounding Event: Blister Status: Date Acquired: 08/23/2022 Comorbid Asthma, Chronic Obstructive Pulmonary Disease (COPD), Weeks Of Treatment: 3 History: Congestive Heart Failure, Hypertension, Rheumatoid Arthritis, Clustered  Wound: No Neuropathy, Received Chemotherapy Photos Wound Measurements Length: (cm) 1.3 Width: (cm) 1.2 Depth: (cm) 0.8 Area: (cm) 1.225 Volume: (cm) 0.98 % Reduction in Area: -18.1% % Reduction in Volume: -57.6% Epithelialization: None Tunneling: No Undermining: Yes Starting Position (o'clock): 12 Ending Position (o'clock): 6 Maximum Distance: (cm) 1.3 Wound Description Classification: Full Thickness Without Exposed Suppor Wound Margin: Distinct, outline attached Exudate Amount: Medium Exudate Type: Serosanguineous Exudate Color: red, brown t Structures Foul Odor After Cleansing: No Slough/Fibrino Yes Wound Bed Granulation Amount: Medium (34-66%) Exposed Structure Granulation Quality: Red Fascia Exposed: No Necrotic Amount: Medium (34-66%) Fat Layer (Subcutaneous Tissue) Exposed: Yes Necrotic Quality: Adherent Slough Tendon Exposed: No Muscle Exposed: No Joint Exposed: No Bone Exposed: No Periwound Skin Texture Texture Color No Abnormalities Noted: Yes No Abnormalities Noted: No Erythema: Yes Moisture No Abnormalities Noted: Yes Temperature / Pain Temperature: No Abnormality [place], Pierce ([id number]) [id number] Page 7 of 9 Treatment Notes Wound #1 (Foot) Wound Laterality: Dorsal, Right Cleanser Soap and Water Discharge Instruction: May shower and wash wound with dial antibacterial soap and water prior to dressing change. Wound Cleanser Discharge Instruction: Cleanse the wound with wound cleanser prior to applying a clean dressing using gauze sponges, not tissue or cotton balls. Peri-Wound Care Topical Skintegrity Hydrogel 4 (oz) Discharge Instruction: Apply hydrogel as directed Primary Dressing Promogran Prisma Matrix, 4.34 (sq in) (silver collagen) Discharge Instruction: Moisten collagen with saline or hydrogel Secondary Dressing Secured With Transpore Surgical Tape, 2x10 (in/yd) Discharge Instruction: Secure dressing with  tape as directed. Compression Wrap Kerlix Roll 4.5x3.1 (in/yd) Discharge Instruction: Apply Kerlix and Coban compression as directed. Compression Stockings Add-Ons Electronic Signature(s) Signed: 10/13/2022 3:35:50 PM By: Adline Peals Entered By: Adline Peals on 10/13/2022 12:49:27 -------------------------------------------------------------------------------- Wound Assessment Details Patient Name: Date of Service: ELON, LOMELI [name] 10/13/2022 12:30 PM Medical Record Number: [id number] Patient Account Number: [id number] Date of Birth/Sex: Treating RN: July 24, 1946 (76 y.o. [name] Primary Care Erinne Gillentine: [name] Other Clinician: Referring Jamyah Folk: Treating Ruta Capece/Extender: [name] in Treatment: 3 Wound Status Wound Number: 2 Primary Abrasion Etiology: Wound Location: Right,  Lateral Lower Leg Wound Open Wounding Event: Skin Tear/Laceration Status: Date Acquired: 09/20/2022 Comorbid Asthma, Chronic Obstructive Pulmonary Disease (COPD), Weeks Of Treatment: 3 History: Congestive Heart Failure, Hypertension, Rheumatoid Arthritis, Clustered Wound: No Neuropathy, Received Chemotherapy Photos VERENISE, MOULIN ([id number]) [id number] Page 8 of 9 Wound Measurements Length: (cm) Width: (cm) Depth: (cm) Area: (cm) Volume: (cm) 0 % Reduction in Area: 100% 0 % Reduction in Volume: 100% 0 Epithelialization: None 0 Tunneling: No 0 Undermining: No Wound Description Classification: Full Thickness Without Exposed Support Structures Wound Margin: Distinct, outline attached Exudate Amount: None Present Foul Odor After Cleansing: No Slough/Fibrino No Wound Bed Granulation Amount: None Present (0%) Exposed Structure Necrotic Amount: None Present (0%) Fascia Exposed: No Fat Layer (Subcutaneous Tissue) Exposed: No Tendon Exposed: No Muscle Exposed: No Joint Exposed: No Bone Exposed:  No Periwound Skin Texture Texture Color No Abnormalities Noted: Yes No Abnormalities Noted: Yes Moisture Temperature / Pain No Abnormalities Noted: Yes Temperature: No Abnormality Electronic Signature(s) Signed: 10/13/2022 3:35:50 PM By: Adline Peals Entered By: Adline Peals on 10/13/2022 12:50:01 -------------------------------------------------------------------------------- Vitals Details Patient Name: Date of Service: [name] 10/13/2022 12:30 PM Medical Record Number: [id number] Patient Account Number: [id number] Date of Birth/Sex: Treating RN: 06/16/1946 (76 y.o. [name] Primary Care Zoiey Christy: [name] Other Clinician: Referring Rebacca Votaw: Treating Cameryn Chrisley/Extender: [name] in Treatment: 3 Vital Signs Time Taken: 12:40 Temperature (F): 98.3 Height (in): 64 Pulse (bpm): 103 Weight (lbs): 111 Respiratory Rate (breaths/min): 18 Body Mass Index (BMI): 19.1 Blood Pressure (mmHg): 103/70 Reference Range: 80 - 120 mg / dl Electronic Signature(s) Signed: 10/13/2022 3:35:50 PM By: Adline Peals Entered By: Adline Peals on 10/13/2022 12:40:48 [place], [name] ([id number]) [id number] Page 9 of 9

## 2022-10-13 NOTE — Progress Notes (Signed)
KHILEY, LIESER ([id number]) [id number] Page 1 of 6 Visit Report for 10/13/2022 HPI Details Patient Name: Date of Service: [name], [name] 10/13/2022 12:30 PM Medical Record Number: [id number] Patient Account Number: [id number] Date of Birth/Sex: Treating RN: August 04, 1946 (76 y.o. F) Primary Care Provider: [name] Other Clinician: Referring Provider: Treating Provider/Extender: [name] in Treatment: 3 History of Present Illness HPI Description: ADMISSION 09/20/2022 This is a 76 year old woman with a past medical history significant for congestive heart failure, atrial fibrillation, rheumatoid arthritis on long-term steroid treatment, CMML currently receiving chemotherapy and moderate malnutrition. She presents to clinic today with a wound on the dorsum of her right foot. She says it started out as a bruise and then developed a bump which subsequently broke open. She says she was sent to our clinic by her oncologist. She is not diabetic and does not smoke. ABI in clinic today was 1.55. She has had formal segmental arterial Dopplers done, a little over a year ago, which did not show any evidence of occlusive vascular disease. On the dorsal aspect of the right foot, there is a wound with a lot of old hematoma, slough, eschar, and and nonviable subcutaneous tissue. T the best of my o ability to discern, it does appear to involve the muscle layer, but there is no evidence of necrosis. The wound is pouring serous fluid; the patient has 3+ pitting edema to the bilateral lower extremities. 09/29/2022: The patient came to clinic today with nothing but a T overlying her wound. She is accompanied by her husband today. She has not elfa communicated with her cardiologist regarding her fluid overload. She still has 3+ pitting edema bilaterally. The wound is still pouring serous fluid, although not quite as effusively as at her initial  visit. There is still quite a bit of nonviable tissue and hematoma present. 10/13; patient's wound on the right lateral lower leg his eschared over and may well be on its way to healing. The real problem here is on the right dorsal foot just proximal to her toes. The patient is still puzzled by the cause of this. Looking at a picture on the patient's smart phone there is a suggestion that this might have been a hematoma which seems to fit what Dr. [name] had felt on her initial evaluation. They have been using iodoform packing kerlix and Coban. 10/20; right lower leg is healed. We are approved for snap VAC on the right foot. We will try to arrange home health but until then she is going to need a nurse visit on Tuesday Electronic Signature(s) Signed: 10/13/2022 4:47:23 PM By: Linton Ham MD Entered By: Linton Ham on 10/13/2022 13:16:54 -------------------------------------------------------------------------------- Physical Exam Details Patient Name: Date of Service: Sherry Masson [name] 10/13/2022 12:30 PM Medical Record Number: [id number] Patient Account Number: [id number] Date of Birth/Sex: Treating RN: 07-30-1946 (76 y.o. F) Primary Care Provider: [name] Other Clinician: Referring Provider: Treating Provider/Extender: [name] in Treatment: 3 Constitutional Sitting or standing Blood Pressure is within target range for patient.. Pulse regular and within target range for patient.Marland Kitchen Respirations regular, non-labored and within target range.. Temperature is normal and within the target range for the patient.Marland Kitchen Appears in no distress. Notes Wound exam; punched out wound on the right dorsal foot just proximal to her toes there is significant undermining especially medially but no palpable deep structures. There is no evidence of infection what I can see of the wound bed shows healthy granulation  tissue. No surrounding soft tissue  problems Electronic Signature(s) Signed: 10/13/2022 4:47:23 PM By: Linton Ham MD Entered By: Linton Ham on 10/13/2022 13:17:43 Cerulean, [name] ([id number]) [phone].pdf Page 2 of 6 -------------------------------------------------------------------------------- Physician Orders Details Patient Name: Date of Service: [name], [name] 10/13/2022 12:30 PM Medical Record Number: [id number] Patient Account Number: [id number] Date of Birth/Sex: Treating RN: 21-Jan-1946 (76 y.o. [name] Primary Care Provider: [name] Other Clinician: Referring Provider: Treating Provider/Extender: [name] in Treatment: 3 Verbal / Phone Orders: No Diagnosis Coding Follow-up Appointments ppointment in 1 week. - Dr. [name] Rm 4 Return A Nurse Visit: - tuesday Anesthetic Wound #1 Right,Dorsal Foot (In clinic) Topical Lidocaine 4% applied to wound bed Bathing/ Shower/ Hygiene May shower with protection but do not get wound dressing(s) wet. Negative Presssure Wound Therapy SNAP Vac to wound continuously at 158m/hg pressure Blue Foam Edema Control - Lymphedema / SCD / Other Left Lower Extremity Elevate legs to the level of the heart or above for 30 minutes daily and/or when sitting, a frequency of: Avoid standing for long periods of time. Patient to wear own compression stockings every day. Compression stocking or Garment 20-30 mm/Hg pressure to: - left leg Home Health dmit to Home Health for wound care. May utilize formulary equivalent dressing for wound treatment orders unless otherwise specified. - A For skilled nursing for wound care. New wound care orders this week; continue Home Health for wound care. May utilize formulary equivalent dressing for wound treatment orders unless otherwise specified. Wound Treatment Wound #1 - Foot Wound Laterality: Dorsal, Right Cleanser: Soap and Water 1 x Per Day/30  Days Discharge Instructions: May shower and wash wound with dial antibacterial soap and water prior to dressing change. Cleanser: Wound Cleanser 1 x Per Day/30 Days Discharge Instructions: Cleanse the wound with wound cleanser prior to applying a clean dressing using gauze sponges, not tissue or cotton balls. Topical: Skintegrity Hydrogel 4 (oz) 1 x Per Day/30 Days Discharge Instructions: Apply hydrogel as directed Prim Dressing: Promogran Prisma Matrix, 4.34 (sq in) (silver collagen) 1 x Per Day/30 Days ary Discharge Instructions: Moisten collagen with saline or hydrogel Secured With: Transpore Surgical Tape, 2x10 (in/yd) 1 x Per Day/30 Days Discharge Instructions: Secure dressing with tape as directed. Compression Wrap: Kerlix Roll 4.5x3.1 (in/yd) (Generic) 1 x Per Day/30 Days Discharge Instructions: Apply Kerlix and Coban compression as directed. Patient Medications llergies: penicillin, Plaquenil, digoxin, hydroxychloroquine, alendronate sodium, gluten A Notifications Medication Indication Start End 10/13/2022 lidocaine DOSE topical 4 % cream - cream topical Bryner, Jaxson H ([id number] [phone] Page 3 of 6 Electronic Signature(s) Signed: 10/13/2022 3:35:50 PM By: HAdline PealsSigned: 10/13/2022 4:47:23 PM By: RLinton HamMD Entered By: HAdline Pealson 10/13/2022 13:14:30 -------------------------------------------------------------------------------- Problem List Details Patient Name: Date of Service: [name], Sherry N H. 10/13/2022 12:30 PM Medical Record Number: [id number] Account Number: [id number] of Birth/Sex: Treating RN: 1Sep 25, 1947(76y.o. 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 09/20/2022 No Yes without evidence of necrosis L97.812 Non-pressure chronic ulcer of other part of right lower leg with fat layer 09/20/2022 No Yes exposed IY04.59Chronic systolic (congestive) heart failure 09/20/2022 No Yes M06.9 Rheumatoid arthritis, unspecified 09/20/2022 No Yes Z79.52 Long term (current) use of systemic steroids 09/20/2022 No Yes  I70.90 Unspecified atherosclerosis 09/20/2022 No Yes 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 F) Primary Care Provider: [name] Other Clinician: Referring Provider: Treating Provider/Extender: [name] in Treatment: 3 Subjective History of Present Illness (HPI) ADMISSION 09/20/2022 This is a 76 year old woman with a past medical history significant for congestive heart failure, atrial fibrillation, rheumatoid arthritis on long-term steroid treatment, CMML currently receiving chemotherapy and moderate malnutrition. She presents to clinic today with a wound on the dorsum of her right foot. She says it started out as a bruise and then developed a bump which subsequently broke open. She says she was sent to our clinic by her oncologist. She is not diabetic and does not smoke. ABI in clinic today was 1.55. She has had formal segmental arterial Dopplers done, a little over a year  ago, which did not show any evidence of occlusive vascular disease. On the dorsal aspect of the right foot, there is a wound with a lot of old hematoma, slough, eschar, and and nonviable subcutaneous tissue. T the best of my o ability to discern, it does appear to involve the muscle layer, but there is no evidence of necrosis. The wound is pouring serous fluid; the patient has 3+ pitting edema to the bilateral lower extremities. 09/29/2022: The patient came to clinic today with nothing but a T overlying her wound. She is accompanied by her husband today. She has not elfa communicated with her cardiologist regarding her fluid overload. She still has 3+ pitting edema bilaterally. The wound is still pouring serous fluid, although not quite as effusively as at her initial visit. There is still quite a bit of nonviable tissue and hematoma present. 10/13; patient's wound on the right lateral lower leg his eschared over and may well be on its way to healing. The real problem here is on the right dorsal foot just proximal to her toes. The patient is still puzzled by the cause of this. Looking at a picture on the patient's smart phone there is a suggestion that this might have been a hematoma which seems to fit what Dr. [name] had felt on her initial evaluation. They have been using iodoform packing kerlix and Coban. 10/20; right lower leg is healed. We are approved for snap VAC on the right foot. We will try to arrange home health but until then she is going to need a nurse visit on Tuesday Objective Constitutional Sitting or standing Blood Pressure is within target range for patient.. Pulse regular and within target range for patient.Marland Kitchen Respirations regular, non-labored and within target range.. Temperature is normal and within the target range for the patient.Marland Kitchen Appears in no distress. Vitals Time Taken: 12:40 PM, Height: 64 in, Weight: 111 lbs, BMI: 19.1, Temperature: 98.3 F, Pulse: 103 bpm, Respiratory  Rate: 18 breaths/min, Blood Pressure: 103/70 mmHg. General Notes: Wound exam; punched out wound on the right dorsal foot just proximal to her toes there is significant undermining especially medially but no palpable deep structures. There is no evidence of infection what I can see of the wound bed shows healthy granulation tissue. No surrounding soft  tissue problems Integumentary (Hair, Skin) Wound #1 status is Open. Original cause of wound was Blister. The date acquired was: 08/23/2022. The wound has been in treatment 3 weeks. The wound is located on the Right,Dorsal Foot. The wound measures 1.3cm length x 1.2cm width x 0.8cm depth; 1.225cm^2 area and 0.98cm^3 volume. There is Fat Layer (Subcutaneous Tissue) exposed. There is no tunneling noted, however, there is undermining starting at 12:00 and ending at 6:00 with a maximum distance of 1.3cm. There is a medium amount of serosanguineous drainage noted. The wound margin is distinct with the outline attached to the wound base. There is medium (34-66%) red granulation within the wound bed. There is a medium (34-66%) amount of necrotic tissue within the wound bed including Adherent Slough. The periwound skin appearance had no abnormalities noted for texture. The periwound skin appearance had no abnormalities noted for moisture. The periwound skin appearance exhibited: Erythema. The surrounding wound skin color is noted with erythema. Periwound temperature was noted as No Abnormality. Wound #2 status is Open. Original cause of wound was Skin T ear/Laceration. The date acquired was: 09/20/2022. The wound has been in treatment 3 weeks. The wound is located on the Right,Lateral Lower Leg. The wound measures 0cm length x 0cm width x 0cm depth; 0cm^2 area and 0cm^3 volume. There is no tunneling or undermining noted. There is a none present amount of drainage noted. The wound margin is distinct with the outline attached to the wound base. There is no  granulation within the wound bed. There is no necrotic tissue within the wound bed. The periwound skin appearance had no abnormalities noted for texture. The periwound skin appearance had no abnormalities noted for moisture. The periwound skin appearance had no abnormalities noted for color. Periwound temperature was noted as No Abnormality. 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 - Dr. Alvera Singh 4 Nurse Visit: - tuesday Anesthetic: Wound #1 Right,Dorsal Foot: (In clinic) Topical Lidocaine 4% applied to wound bed Bathing/ Shower/ Hygiene: May shower with protection but do not get wound dressing(s) wet. Negative Presssure Wound Therapy: SNAP Vac to wound continuously at 113m/hg pressure Blue Foam Edema Control - Lymphedema / SCD / Other: Elevate legs to the level of the heart or above for 30 minutes daily and/or when sitting, a frequency of: Avoid standing for long periods of time. Patient to wear own compression stockings every day. Compression stocking or Garment 20-30 mm/Hg pressure to: - left leg [facility] May utilize formulary equivalent dressing for wound treatment orders unless otherwise specified. - For skilled nursing for wound care. New wound care orders this week; continue Home Health for wound care. May utilize formulary equivalent dressing for wound treatment orders unless otherwise specified. The  following medication(s) was prescribed: lidocaine topical 4 % cream cream topical was prescribed at facility WOUND #1: - Foot Wound Laterality: Dorsal, Right Cleanser: Soap and Water 1 x Per Day/30 Days Discharge Instructions: May shower and wash wound with dial antibacterial soap and water prior to dressing change. Cleanser: Wound Cleanser 1 x Per Day/30 Days Discharge Instructions:  Cleanse the wound with wound cleanser prior to applying a clean dressing using gauze sponges, not tissue or cotton balls. Topical: Skintegrity Hydrogel 4 (oz) 1 x Per Day/30 Days Discharge Instructions: Apply hydrogel as directed Prim Dressing: Promogran Prisma Matrix, 4.34 (sq in) (silver collagen) 1 x Per Day/30 Days ary Discharge Instructions: Moisten collagen with saline or hydrogel Secured With: Transpore Surgical T ape, 2x10 (in/yd) 1 x Per Day/30 Days Discharge Instructions: Secure dressing with tape as directed. Com pression Wrap: Kerlix Roll 4.5x3.1 (in/yd) (Generic) 1 x Per Day/30 Days Discharge Instructions: Apply Kerlix and Coban compression as directed. 1. Collagen under the snap VAC. 2. The area on the right leg is healed 3. We are going to try to get home health out to change this twice although I do not know which home health companies handle SnapVac this may be a problem. 4 I see no evidence of infection Electronic Signature(s) Signed: 10/13/2022 4:47:23 PM By: Linton Ham MD Entered By: Linton Ham on 10/13/2022 13:19:57 -------------------------------------------------------------------------------- SuperBill Details Patient Name: Date of Service: [name], [name] 10/13/2022 Medical Record Number: [id number] Patient Account Number: [id number] Date of Birth/Sex: Treating RN: 1946/02/26 (76 y.o. 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 L97.812 Non-pressure chronic ulcer of other part of right lower leg with fat layer exposed O06.00 Chronic systolic (congestive) heart failure M06.9 Rheumatoid arthritis, unspecified Z79.52 Long term (current) use of systemic steroids I70.90 Unspecified atherosclerosis 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 Facility Procedures : CPT4 Code: [id number] Description: 42395 NEG PRESS WND TX <=50 SQ CM ICD-10 Diagnosis Description 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

## 2022-10-18 ENCOUNTER — Encounter (HOSPITAL_BASED_OUTPATIENT_CLINIC_OR_DEPARTMENT_OTHER): Payer: Medicare Other | Admitting: General Surgery

## 2022-10-18 DIAGNOSIS — L97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 evidence of necrosis: Secondary | ICD-10-CM | POA: Diagnosis not present

## 2022-10-18 DIAGNOSIS — E1122 Type 2 diabetes mellitus with diabetic chronic kidney disease: Secondary | ICD-10-CM | POA: Diagnosis not present

## 2022-10-18 DIAGNOSIS — M069 Rheumatoid arthritis, unspecified: Secondary | ICD-10-CM | POA: Diagnosis not present

## 2022-10-18 DIAGNOSIS — L97812 Non-pressure chronic ulcer of other part of right lower leg with fat layer exposed: Secondary | ICD-10-CM | POA: Diagnosis not present

## 2022-10-18 DIAGNOSIS — E11622 Type 2 diabetes mellitus with other skin ulcer: Secondary | ICD-10-CM | POA: Diagnosis not present

## 2022-10-18 DIAGNOSIS — E11621 Type 2 diabetes mellitus with foot ulcer: Secondary | ICD-10-CM | POA: Diagnosis not present

## 2022-10-18 NOTE — Progress Notes (Signed)
TEZRA, [name] ([id number]) [id number] Page 1 of 4 Visit Report for 10/18/2022 Arrival Information Details Patient Name: Date of Service: [name], [name] 10/18/2022 11:30 A M Medical Record Number: [id number] Patient Account Number: [id number] Date of Birth/Sex: Treating RN: Dec 10, 1946 (76 y.o. [name] Primary Care [name]: [name] Other Clinician: Referring Reise Gladney: Treating [name] in Treatment: 4 Visit Information History Since Last Visit Added or deleted any medications: No Patient Arrived: Wheel Chair Any new allergies or adverse reactions: No Arrival Time: 11:37 Had a fall or experienced change in No Accompanied By: self activities of daily living that may affect Transfer Assistance: None risk of falls: Patient Identification Verified: Yes Signs or symptoms of abuse/neglect since last visito No Secondary Verification Process Completed: Yes Hospitalized since last visit: No Patient Requires Transmission-Based Precautions: No Implantable device outside of the clinic excluding No Patient Has Alerts: No cellular tissue based products placed in the center since last visit: Has Dressing in Place as Prescribed: Yes Pain Present Now: Yes Electronic Signature(s) Signed: 10/18/2022 4:57:46 PM By: Adline Peals Entered By: Adline Peals on 10/18/2022 11:38:11 -------------------------------------------------------------------------------- Encounter Discharge Information Details Patient Name: Date of Service: Sherry Masson [name] 10/18/2022 11:30 A M Medical Record Number: [id number] Patient Account Number: [id number] Date of Birth/Sex: Treating RN: 1946/01/06 (76 y.o. [name] Primary Care Kaimana Neuzil: [name] Other Clinician: Referring Omarii Scalzo: Treating [name]/Extender: Tonna [name] in Treatment: 4 Encounter  Discharge Information Items Discharge Condition: Stable Ambulatory Status: Wheelchair Discharge Destination: Home Transportation: Private Auto Accompanied By: self Schedule Follow-up Appointment: Yes Clinical Summary of Care: Patient Declined Electronic Signature(s) Signed: 10/18/2022 4:57:46 PM By: Adline Peals Entered By: Adline Peals on 10/18/2022 16:47:45 [name], [name] ([id number]) [id number] Page 2 of 4 -------------------------------------------------------------------------------- Negative Pressure Wound Therapy Maintenance (NPWT) Details Patient Name: Date of Service: [name], [name] 10/18/2022 11:30 A M Medical Record Number: [id number] Patient Account Number: [id number] Date of Birth/Sex: Treating RN: 1946-08-19 (76 y.o. [name] Primary Care Alexas Basulto: [name] Other Clinician: Referring Therman Hughlett: Treating [name]/Extender: Tonna [name] in Treatment: 4 NPWT Maintenance Performed for: Wound #1 Right, Dorsal Foot Performed By: Adline Peals, RN Type: VAC System Coverage Size (sq cm): 1.56 Pressure Type: Constant Pressure Setting: 125 mmHG Drain Type: None Sponge/Dressing Type: Foam, Blue Date Initiated: 10/13/2022 Dressing Removed: Yes Quantity of Sponges/Gauze Removed: 1 Canister Changed: Yes Dressing Reapplied: Yes Quantity of Sponges/Gauze Inserted: 1 Days On NPWT : 6 Electronic Signature(s) Signed: 10/18/2022 4:57:46 PM By: Adline Peals Entered By: Adline Peals on 10/18/2022 16:47:23 -------------------------------------------------------------------------------- Patient/Caregiver Education Details Patient Name: Date of Service: [name] 10/25/2023andnbsp11:30 A M Medical Record Number: [id number] Patient Account Number: [id number] Date of Birth/Gender: Treating RN: 1946-07-02 (76 y.o. [name] Primary Care Physician:  [name] Other Clinician: Referring Physician: Treating Physician/Extender: Tonna [name] in Treatment: 4 Education Assessment Education Provided To: Patient Education Topics Provided Wound/Skin Impairment: Methods: Explain/Verbal Responses: Reinforcements needed, State content correctly Electronic Signature(s) Signed: 10/18/2022 4:57:46 PM By: Adline Peals Entered By: Adline Peals on 10/18/2022 16:47:35 [name], [name] ([id number]) [id number] Page 3 of 4 -------------------------------------------------------------------------------- Wound Assessment Details Patient Name: Date of Service: [name], [name] 10/18/2022 11:30 A M Medical Record Number: [id number] Patient Account Number: [id number] Date of Birth/Sex: Treating RN: 1946/06/01 (76 y.o. [name] Primary Care Monica Codd: [name] Other Clinician: Referring Jyl Chico: Treating Zakyia Gagan/Extender: Tonna [name]  in Treatment: 4 Wound Status Wound Number: 1 Primary Lymphedema Etiology: Wound Location: Right, Dorsal Foot Wound Open Wounding Event: Blister Status: Date Acquired: 08/23/2022 Comorbid Asthma, Chronic Obstructive Pulmonary Disease (COPD), Weeks Of Treatment: 4 History: Congestive Heart Failure, Hypertension, Rheumatoid Arthritis, Clustered Wound: No Neuropathy, Received Chemotherapy Wound Measurements Length: (cm) 1.3 Width: (cm) 1.2 Depth: (cm) 0.8 Area: (cm) 1.225 Volume: (cm) 0.98 % Reduction in Area: -18.1% % Reduction in Volume: -57.6% Epithelialization: None Wound Description Classification: Full Thickness Without Exposed Suppor Wound Margin: Distinct, outline attached Exudate Amount: Medium Exudate Type: Serosanguineous Exudate Color: red, brown t Structures Foul Odor After Cleansing: No Slough/Fibrino Yes Wound Bed Granulation Amount: Medium (34-66%) Exposed  Structure Granulation Quality: Red Fascia Exposed: No Necrotic Amount: Medium (34-66%) Fat Layer (Subcutaneous Tissue) Exposed: Yes Necrotic Quality: Adherent Slough Tendon Exposed: No Muscle Exposed: No Joint Exposed: No Bone Exposed: No Periwound Skin Texture Texture Color No Abnormalities Noted: Yes No Abnormalities Noted: No Erythema: Yes Moisture No Abnormalities Noted: Yes Temperature / Pain Temperature: No Abnormality Treatment Notes Wound #1 (Foot) Wound Laterality: Dorsal, Right Cleanser Soap and Water Discharge Instruction: May shower and wash wound with dial antibacterial soap and water prior to dressing change. Wound Cleanser Discharge Instruction: Cleanse the wound with wound cleanser prior to applying a clean dressing using gauze sponges, not tissue or cotton balls. Peri-Wound Care Topical Skintegrity Hydrogel 4 (oz) Discharge Instruction: Apply hydrogel as directed Primary Dressing Promogran Prisma Matrix, 4.34 (sq in) (silver collagen) Discharge Instruction: Moisten collagen with saline or hydrogel Simkin, Josue H ([id number]) [phone].pdf Page 4 of 4 Secondary Dressing Secured With Transpore Surgical Tape, 2x10 (in/yd) Discharge Instruction: Secure dressing with tape as directed. Compression Wrap Kerlix Roll 4.5x3.1 (in/yd) Discharge Instruction: Apply Kerlix and Coban compression as directed. Compression Stockings Add-Ons Electronic Signature(s) Signed: 10/18/2022 4:57:46 PM By: Adline Peals Entered By: Adline Peals on 10/18/2022 16:46:58 -------------------------------------------------------------------------------- Vitals Details Patient Name: Date of Service: Sherry Masson [name] 10/18/2022 11:30 A M Medical Record Number: [id number] Patient Account Number: [id number] Date of Birth/Sex: Treating RN: January 28, 1946 (76 y.o. [name] Primary Care Momina Hunton: [name] Other Clinician: Referring  Nakoa Ganus: Treating Kendrew Paci/Extender: Tonna [name] in Treatment: 4 Vital Signs Time Taken: 11:38 Temperature (F): 98.6 Height (in): 64 Pulse (bpm): 151 Weight (lbs): 111 Respiratory Rate (breaths/min): 20 Body Mass Index (BMI): 19.1 Blood Pressure (mmHg): 114/66 Reference Range: 80 - 120 mg / dl Electronic Signature(s) Signed: 10/18/2022 4:57:46 PM By: Adline Peals Entered By: Adline Peals on 10/18/2022 11:40:11

## 2022-10-19 NOTE — Progress Notes (Signed)
KOLBEE, STALLMAN ([id number]) [id number] Page 1 of 1 Visit Report for 10/18/2022 SuperBill Details Patient Name: Date of Service: [name], [name] 10/18/2022 Medical Record Number: [id number] Patient Account Number: [id number] Date of Birth/Sex: Treating RN: 1946-09-24 (76 y.o. 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

## 2022-10-20 ENCOUNTER — Encounter (HOSPITAL_BASED_OUTPATIENT_CLINIC_OR_DEPARTMENT_OTHER): Payer: Medicare Other | Admitting: General Surgery

## 2022-10-20 DIAGNOSIS — M069 Rheumatoid arthritis, unspecified: Secondary | ICD-10-CM | POA: Diagnosis not present

## 2022-10-20 DIAGNOSIS — L97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 evidence of necrosis: Secondary | ICD-10-CM | POA: Diagnosis not present

## 2022-10-20 DIAGNOSIS — L97512 Non-pressure chronic ulcer of other part of right foot with fat layer exposed: Secondary | ICD-10-CM | POA: Diagnosis not present

## 2022-10-20 DIAGNOSIS — L97812 Non-pressure chronic ulcer of other part of right lower leg with fat layer exposed: Secondary | ICD-10-CM | POA: Diagnosis not present

## 2022-10-20 DIAGNOSIS — I890 Lymphedema, not elsewhere classified: Secondary | ICD-10-CM | POA: Diagnosis not present

## 2022-10-20 DIAGNOSIS — E1122 Type 2 diabetes mellitus with diabetic chronic kidney disease: Secondary | ICD-10-CM | POA: Diagnosis not present

## 2022-10-20 DIAGNOSIS — E11622 Type 2 diabetes mellitus with other skin ulcer: Secondary | ICD-10-CM | POA: Diagnosis not present

## 2022-10-20 DIAGNOSIS — E11621 Type 2 diabetes mellitus with foot ulcer: Secondary | ICD-10-CM | POA: Diagnosis not present

## 2022-10-23 DIAGNOSIS — B354 Tinea corporis: Secondary | ICD-10-CM | POA: Diagnosis not present

## 2022-10-23 DIAGNOSIS — D849 Immunodeficiency, unspecified: Secondary | ICD-10-CM | POA: Diagnosis not present

## 2022-10-23 DIAGNOSIS — B356 Tinea cruris: Secondary | ICD-10-CM | POA: Diagnosis not present

## 2022-10-24 NOTE — Progress Notes (Signed)
BRIEANNA, NAU ([id number]) [id number] Page 1 of 8 Visit Report for 10/20/2022 Arrival Information Details Patient Name: Date of Service: [name], [name] 10/20/2022 1:00 PM Medical Record Number: [id number] Patient Account Number: [id number] Date of Birth/Sex: Treating RN: [birth year] (76 y.o. [name], [name] Primary Care [name]: [name] Other Clinician: Referring [name]: Treating [name]/Extender: [name] in Treatment: 4 Visit Information History Since Last Visit Added or deleted any medications: No Patient Arrived: Wheel Chair Any new allergies or adverse reactions: No Arrival Time: 13:07 Had a fall or experienced change in No Accompanied By: husband activities of daily living that may affect Transfer Assistance: EasyPivot Patient Lift risk of falls: Patient Requires Transmission-Based Precautions: No Signs or symptoms of abuse/neglect since last visito No Patient Has Alerts: No Hospitalized since last visit: No Implantable device outside of the clinic excluding No cellular tissue based products placed in the center since last visit: Has Dressing in Place as Prescribed: Yes Pain Present Now: Yes Electronic Signature(s) Signed: 10/24/2022 4:16:20 PM By: Sherry East RN Entered By: [name] on 10/20/2022 13:07:38 -------------------------------------------------------------------------------- Encounter Discharge Information Details Patient Name: Date of Service: [name], [place]. 10/20/2022 1:00 PM Medical Record Number: [id number] Patient Account Number: [id number] Date of Birth/Sex: Treating RN: 04-07-1946 (76 y.o. [name] Primary Care Delayne Sanzo: [name] Other Clinician: Referring Chase Knebel: Treating Rylynne Schicker/Extender: [name] in Treatment: 4 Encounter Discharge Information Items Post Procedure Vitals Discharge Condition:  Stable Temperature (F): 98.2 Ambulatory Status: Wheelchair Pulse (bpm): 92 Discharge Destination: Home Respiratory Rate (breaths/min): 20 Transportation: Private Auto Blood Pressure (mmHg): 126/77 Accompanied By: husband Schedule Follow-up Appointment: Yes Clinical Summary of Care: Electronic Signature(s) Signed: 10/24/2022 4:16:20 PM By: Sherry East RN Entered By: [name] on 10/20/2022 15:37:01 [name], Sherry Reeds ([id number]) [id number] Page 2 of 8 -------------------------------------------------------------------------------- Lower Extremity Assessment Details Patient Name: Date of Service: [name] 10/20/2022 1:00 PM Medical Record Number: [id number] Patient Account Number: [id number] Date of Birth/Sex: Treating RN: [birth year] (76 y.o. [name] Primary Care Tyshun Tuckerman: [name] Other Clinician: Referring Rica Heather: Treating Sofia Jaquith/Extender: [name] in Treatment: 4 Edema Assessment Assessed: [Left: No] [Right: No] [Left: Edema] [Right: :] Calf Left: Right: Point of Measurement: From Medial Instep 26.6 cm Ankle Left: Right: Point of Measurement: From Medial Instep 19.5 cm Vascular Assessment Pulses: Dorsalis Pedis Palpable: [Right:Yes] Electronic Signature(s) Signed: 10/24/2022 4:16:20 PM By: Sherry East RN Entered By: [name] on 10/20/2022 13:11:39 -------------------------------------------------------------------------------- Multi Wound Chart Details Patient Name: Date of Service: [name]. 10/20/2022 1:00 PM Medical Record Number: [id number] Patient Account Number: [id number] Date of Birth/Sex: Treating RN: 1946-04-10 (76 y.o. F) Primary Care Chase Arnall: [name] Other Clinician: Referring Raniyah Curenton: Treating Senica Crall/Extender: [name] in Treatment: 4 Vital Signs Height(in): 64 Pulse(bpm): 18 Weight(lbs):  111 Blood Pressure(mmHg): 126/77 Body Mass Index(BMI): 19.1 Temperature(F): 98.2 Respiratory Rate(breaths/min): 20 [1:Photos:] [N/A:N/A] Right, Dorsal Foot N/A N/A Wound Location: Blister N/A N/A Wounding Event: Lymphedema N/A N/A Primary Etiology: Asthma, Chronic Obstructive N/A N/A Comorbid History: Pulmonary Disease (COPD), Congestive Heart Failure, Hypertension, Rheumatoid Arthritis, Neuropathy, Received Chemotherapy 08/23/2022 N/A N/A Date Acquired: 4 N/A N/A Weeks of Treatment: Open N/A N/A Wound Status: No N/A N/A Wound Recurrence: 1.2x1.2x0.5 N/A N/A Measurements L x W x D (cm) 1.131 N/A N/A A (cm) : rea 0.565 N/A N/A Volume (cm) : -9.10% N/A N/A % Reduction in A rea: 9.20% N/A N/A % Reduction in  Volume: 12 Position 1 (o'clock): 0.8 Maximum Distance 1 (cm): Yes N/A N/A Tunneling: Full Thickness Without Exposed N/A N/A Classification: Support Structures Medium N/A N/A Exudate A mount: Serosanguineous N/A N/A Exudate Type: red, brown N/A N/A Exudate Color: Distinct, outline attached N/A N/A Wound Margin: Medium (34-66%) N/A N/A Granulation A mount: Red N/A N/A Granulation Quality: Medium (34-66%) N/A N/A Necrotic A mount: Fat Layer (Subcutaneous Tissue): Yes N/A N/A Exposed Structures: Fascia: No Tendon: No Muscle: No Joint: No Bone: No None N/A N/A Epithelialization: Debridement - Selective/Open Wound N/A N/A Debridement: Pre-procedure Verification/Time Out 13:25 N/A N/A Taken: Lidocaine 5% topical ointment N/A N/A Pain Control: Slough N/A N/A Tissue Debrided: Non-Viable Tissue N/A N/A Level: 0.64 N/A N/A Debridement A (sq cm): rea Curette N/A N/A Instrument: Minimum N/A N/A Bleeding: Pressure N/A N/A Hemostasis A chieved: 0 N/A N/A Procedural Pain: 0 N/A N/A Post Procedural Pain: Procedure was tolerated well N/A N/A Debridement Treatment Response: 1.2x1.2x0.8 N/A N/A Post Debridement Measurements L x W x D  (cm) 0.905 N/A N/A Post Debridement Volume: (cm) No Abnormalities Noted N/A N/A Periwound Skin Texture: No Abnormalities Noted N/A N/A Periwound Skin Moisture: Erythema: Yes N/A N/A Periwound Skin Color: No Abnormality N/A N/A Temperature: Debridement N/A N/A Procedures Performed: Negative Pressure Wound Therapy Maintenance (NPWT) Treatment Notes Electronic Signature(s) Signed: 10/20/2022 1:41:08 PM By: Fredirick Maudlin MD FACS Entered By: Fredirick Maudlin on 10/20/2022 13:41:08 -------------------------------------------------------------------------------- Multi-Disciplinary Care Plan Details Patient Name: Date of Service: [name]. 10/20/2022 1:00 PM Medical Record Number: [id number] Patient Account Number: [id number] Date of Birth/Sex: Treating RN: October 09, 1946 (76 y.o. [name] Primary Care Xzavien Harada: [name] Other Clinician: Referring Trenita Hulme: Treating Tranika Scholler/Extender: Veneta Penton [place], [place] ([id number]) [id number] Page 4 of 8 Weeks in Treatment: 4 Active Inactive Peripheral Neuropathy Nursing Diagnoses: Potential alteration in peripheral tissue perfusion (select prior to confirmation of diagnosis) Goals: Patient/caregiver will verbalize understanding of disease process and disease management Date Initiated: 09/20/2022 Target Resolution Date: 11/24/2022 Goal Status: Active Interventions: Provide education on Management of Neuropathy and Related Ulcers Treatment Activities: Patient referred for customized footwear/offloading : 09/20/2022 Notes: Wound/Skin Impairment Nursing Diagnoses: Knowledge deficit related to ulceration/compromised skin integrity Goals: Ulcer/skin breakdown will have a volume reduction of 30% by week 4 Date Initiated: 09/20/2022 Target Resolution Date: 11/24/2022 Goal Status: Active Interventions: Assess ulceration(s) every visit Treatment Activities: Skin  care regimen initiated : 09/20/2022 Topical wound management initiated : 09/20/2022 Notes: Electronic Signature(s) Signed: 10/24/2022 4:16:20 PM By: Sherry East RN Entered By: [name] on 10/20/2022 13:21:34 -------------------------------------------------------------------------------- Negative Pressure Wound Therapy Maintenance (NPWT) Details Patient Name: Date of Service: [name], [name] 10/20/2022 1:00 PM Medical Record Number: [id number] Patient Account Number: [id number] Date of Birth/Sex: Treating RN: Nov 27, 1946 (76 y.o. [name] Primary Care Vrinda Heckstall: [name] Other Clinician: Referring Rhesa Forsberg: Treating Graycen Degan/Extender: [name] in Treatment: 4 NPWT Maintenance Performed for: Wound #1 Right, Dorsal Foot Performed By: Sherry East, RN Type: VAC System Coverage Size (sq cm): 1.44 Pressure Type: Constant Pressure Setting: 125 mmHG Drain Type: None Primary Contact: Other : Prisma Sponge/Dressing Type: Foam, Blue Date Initiated: 10/13/2022 Dressing Removed: No Quantity of Sponges/Gauze Removed: 1 Canister Changed: No Canister Exudate Volume: [place], [place] ([id number]) [id number] Page 5 of 8 Dressing Reapplied: No Quantity of Sponges/Gauze Inserted: 1 Respones T Treatment: o tolerated well Days On NPWT : 8 Post Procedure Diagnosis Same as Pre-procedure Electronic Signature(s) Signed: 10/24/2022 4:16:20 PM By: Sherry East RN  Entered By: [name] on 10/20/2022 13:29:05 -------------------------------------------------------------------------------- Pain Assessment Details Patient Name: Date of Service: [name], [name] 10/20/2022 1:00 PM Medical Record Number: [id number] Patient Account Number: [id number] Date of Birth/Sex: Treating RN: [birth year] (76 y.o. [name] Primary Care Little Bashore: [name] Other Clinician: Referring Esther Bradstreet: Treating  Nicklaus Alviar/Extender: [name] in Treatment: 4 Active Problems Location of Pain Severity and Description of Pain Patient Has Paino Yes Site Locations Rate the pain. Current Pain Level: 3 Character of Pain Describe the Pain: Burning Pain Management and Medication Current Pain Management: Electronic Signature(s) Signed: 10/24/2022 4:16:20 PM By: Sherry East RN Entered By: [name] on 10/20/2022 13:08:54 Patient/Caregiver Education Details -------------------------------------------------------------------------------- [name] ([id number]) [id number] Page 6 of 8 Patient Name: Date of Service: [name], [name] 10/27/2023andnbsp1:00 PM Medical Record Number: [id number] Patient Account Number: [id number] Date of Birth/Gender: Treating RN: 21-Jun-1946 (76 y.o. [name] Primary Care Physician: [name] Other Clinician: Referring Physician: Treating Physician/Extender: [name] in Treatment: 4 Education Assessment Education Provided To: Patient Education Topics Provided Peripheral Neuropathy: Methods: Explain/Verbal Responses: Reinforcements needed, State content correctly Electronic Signature(s) Signed: 10/24/2022 4:16:20 PM By: Sherry East RN Entered By: [name] on 10/20/2022 13:21:47 -------------------------------------------------------------------------------- Wound Assessment Details Patient Name: Date of Service: [name]. 10/20/2022 1:00 PM Medical Record Number: [id number] Patient Account Number: [id number] Date of Birth/Sex: Treating RN: 03/29/1946 (76 y.o. [name], [name] Primary Care Yahel Fuston: [name] Other Clinician: Referring Ailee Pates: Treating Shavonta Gossen/Extender: [name] in Treatment: 4 Wound Status Wound Number: 1 Primary Lymphedema Etiology: Wound Location: Right, Dorsal Foot Wound  Open Wounding Event: Blister Status: Date Acquired: 08/23/2022 Comorbid Asthma, Chronic Obstructive Pulmonary Disease (COPD), Weeks Of Treatment: 4 History: Congestive Heart Failure, Hypertension, Rheumatoid Arthritis, Clustered Wound: No Neuropathy, Received Chemotherapy Photos Wound Measurements Length: (cm) 1.2 Width: (cm) 1.2 Depth: (cm) 0.5 Area: (cm) 1.131 Volume: (cm) 0.565 % Reduction in Area: -9.1% % Reduction in Volume: 9.2% Epithelialization: None Tunneling: Yes Position (o'clock): 12 Maximum Distance: (cm) 0.8 Undermining: No Wound Description [place], [name] ([id number]) Classification: Full Thickness Without Exposed Support Structures Wound Margin: Distinct, outline attached Exudate Amount: Medium Exudate Type: Serosanguineous Exudate Color: red, brown [id number] Page 7 of 8 Foul Odor After Cleansing: No Slough/Fibrino Yes Wound Bed Granulation Amount: Medium (34-66%) Exposed Structure Granulation Quality: Red Fascia Exposed: No Necrotic Amount: Medium (34-66%) Fat Layer (Subcutaneous Tissue) Exposed: Yes Necrotic Quality: Adherent Slough Tendon Exposed: No Muscle Exposed: No Joint Exposed: No Bone Exposed: No Periwound Skin Texture Texture Color No Abnormalities Noted: Yes No Abnormalities Noted: No Erythema: Yes Moisture No Abnormalities Noted: Yes Temperature / Pain Temperature: No Abnormality Treatment Notes Wound #1 (Foot) Wound Laterality: Dorsal, Right Cleanser Soap and Water Discharge Instruction: May shower and wash wound with dial antibacterial soap and water prior to dressing change. Wound Cleanser Discharge Instruction: Cleanse the wound with wound cleanser prior to applying a clean dressing using gauze sponges, not tissue or cotton balls. Peri-Wound Care Topical Skintegrity Hydrogel 4 (oz) Discharge Instruction: Apply hydrogel as directed Primary Dressing Promogran Prisma Matrix, 4.34 (sq in)  (silver collagen) Discharge Instruction: Moisten collagen with saline or hydrogel Secondary Dressing Secured With Transpore Surgical Tape, 2x10 (in/yd) Discharge Instruction: Secure dressing with tape as directed. Compression Wrap Kerlix Roll 4.5x3.1 (in/yd) Discharge Instruction: Apply Kerlix and Coban compression as directed. Compression Stockings Add-Ons Electronic Signature(s) Signed: 10/24/2022 4:16:20 PM By: Sherry East RN Entered By: [name]  on 10/20/2022 13:20:17 -------------------------------------------------------------------------------- Vitals Details Patient Name: Date of Service: [name], [name] 10/20/2022 1:00 PM Medical Record Number: [id number] Patient Account Number: [id number] Date of Birth/Sex: Treating RN: 1946-03-26 (76 y.o. [name] Primary Care Linsi Humann: [name] Other Clinician: Rossie [name] ([id number]) [id number] Page 8 of 8 Referring Valdis Bevill: Treating Mahogony Gilchrest/Extender: [name] in Treatment: 4 Vital Signs Time Taken: 13:07 Temperature (F): 98.2 Height (in): 64 Pulse (bpm): 92 Weight (lbs): 111 Respiratory Rate (breaths/min): 20 Body Mass Index (BMI): 19.1 Blood Pressure (mmHg): 126/77 Reference Range: 80 - 120 mg / dl Electronic Signature(s) Signed: 10/24/2022 4:16:20 PM By: Sherry East RN Entered By: [name] on 10/20/2022 13:08:39

## 2022-10-24 NOTE — Progress Notes (Signed)
[name] ([id number]) [id number] Page 1 of 9 Visit Report for 10/20/2022 Chief Complaint Document Details Patient Name: Date of Service: [name], [name] 10/20/2022 1:00 PM Medical Record Number: [id number] Patient Account Number: [id number] Date of Birth/Sex: Treating RN: Jul 15, 1946 (76 y.o. F) Primary Care Provider: [name] Other Clinician: Referring Provider: Treating Provider/Extender: [name] in Treatment: 4 Information Obtained from: Patient Chief Complaint Patient seen for complaints of Non-Healing Wounds. Electronic Signature(s) Signed: 10/20/2022 1:41:14 PM By: Fredirick Maudlin MD FACS Entered By: Fredirick Maudlin on 10/20/2022 13:41:14 -------------------------------------------------------------------------------- Debridement Details Patient Name: Date of Service: [name] [name] 10/20/2022 1:00 PM Medical Record Number: [id number] Patient Account Number: [id number] Date of Birth/Sex: Treating RN: 1946/07/19 (76 y.o. [name], [name] Primary Care Provider: [name] Other Clinician: Referring Provider: Treating Provider/Extender: [name] in Treatment: 4 Debridement Performed for Assessment: Wound #1 Right,Dorsal Foot Performed By: Physician Fredirick Maudlin, MD Debridement Type: Debridement Level of Consciousness (Pre-procedure): Awake and Alert Pre-procedure Verification/Time Out Yes - 13:25 Taken: Start Time: 13:26 Pain Control: Lidocaine 5% topical ointment T Area Debrided (L x W): otal 0.8 (cm) x 0.8 (cm) = 0.64 (cm) Tissue and other material debrided: [place] Level: Non-Viable Tissue Debridement Description: Selective/Open Wound Instrument: Curette Bleeding: Minimum Hemostasis Achieved: Pressure Procedural Pain: 0 Post Procedural Pain: 0 Response to Treatment: Procedure was tolerated well Level of Consciousness (Post- Awake  and Alert procedure): Post Debridement Measurements of Total Wound Length: (cm) 1.2 Width: (cm) 1.2 Depth: (cm) 0.8 Volume: (cm) 0.905 Character of Wound/Ulcer Post Debridement: Requires Further Debridement Post Procedure Diagnosis Same as Pre-procedure NIRVI, BOEHLER H ([id number]) [phone].pdf Page 2 of 9 Notes Scribed for Dr. [name] by Blanche East, RN Electronic Signature(s) Signed: 10/20/2022 2:19:05 PM By: Fredirick Maudlin MD FACS Signed: 10/24/2022 4:16:20 PM By: Blanche East RN Entered By: Blanche East on 10/20/2022 13:28:03 -------------------------------------------------------------------------------- HPI Details Patient Name: Date of Service: [name] [name] 10/20/2022 1:00 PM Medical Record Number: [id number] Patient Account Number: [id number] Date of Birth/Sex: Treating RN: Feb 05, 1946 (76 y.o. F) Primary Care Provider: [name] Other Clinician: Referring Provider: Treating Provider/Extender: [name] in Treatment: 4 History of Present Illness HPI Description: ADMISSION 09/20/2022 This is a 76 year old woman with a past medical history significant for congestive heart failure, atrial fibrillation, rheumatoid arthritis on long-term steroid treatment, CMML currently receiving chemotherapy and moderate malnutrition. She presents to clinic today with a wound on the dorsum of her right foot. She says it started out as a bruise and then developed a bump which subsequently broke open. She says she was sent to our clinic by her oncologist. She is not diabetic and does not smoke. ABI in clinic today was 1.55. She has had formal segmental arterial Dopplers done, a little over a year ago, which did not show any evidence of occlusive vascular disease. On the dorsal aspect of the right foot, there is a wound with a lot of old hematoma, slough, eschar, and and nonviable subcutaneous tissue. T the best of  my o ability to discern, it does appear to involve the muscle layer, but there is no evidence of necrosis. The wound is pouring serous fluid; the patient has 3+ pitting edema to the bilateral lower extremities. 09/29/2022: The patient came to clinic today with nothing but a T overlying her wound. She is accompanied by her husband today. She has not elfa communicated with her cardiologist regarding her fluid overload. She still  has 3+ pitting edema bilaterally. The wound is still pouring serous fluid, although not quite as effusively as at her initial visit. There is still quite a bit of nonviable tissue and hematoma present. 10/13; patient's wound on the right lateral lower leg his eschared over and may well be on its way to healing. The real problem here is on the right dorsal foot just proximal to her toes. The patient is still puzzled by the cause of this. Looking at a picture on the patient's smart phone there is a suggestion that this might have been a hematoma which seems to fit what Dr. [name] had felt on her initial evaluation. They have been using iodoform packing kerlix and Coban. 10/20; right lower leg is healed. We are approved for snap VAC on the right foot. We will try to arrange home health but until then she is going to need a nurse visit on Tuesday 10/20/2022: It has been 2 weeks since I have seen the wound. It has improved significantly since then. She is currently in a snap VAC and I think this has made quite a difference. The overall wound measurements are smaller. There is no massive drainage, as there had been previously. There is some good granulation tissue forming underneath a bit of slough. Electronic Signature(s) Signed: 10/20/2022 1:43:00 PM By: Fredirick Maudlin MD FACS Entered By: Fredirick Maudlin on 10/20/2022 13:43:00 -------------------------------------------------------------------------------- Physical Exam Details Patient Name: Date of Service: [name] RO  N H. 10/20/2022 1:00 PM Medical Record Number: [id number] Patient Account Number: [id number] Date of Birth/Sex: Treating RN: [birth year] (76 y.o. F) Primary Care Provider: [name] Other Clinician: Referring Provider: Treating Provider/Extender: [name] in Treatment: [address], [place] ([id number]) [id number] Page 3 of 9 Constitutional . . . . No acute distress.Marland Kitchen Respiratory Normal work of breathing on room air.. Notes 10/20/2022: It has been 2 weeks since I have seen the wound. It has improved significantly since then. The overall wound measurements are smaller. There is no massive drainage, as there had been previously. There is some good granulation tissue forming underneath a bit of slough. Electronic Signature(s) Signed: 10/20/2022 1:45:36 PM By: Fredirick Maudlin MD FACS Entered By: Fredirick Maudlin on 10/20/2022 13:45:36 -------------------------------------------------------------------------------- Physician Orders Details Patient Name: Date of Service: [name] [name] 10/20/2022 1:00 PM Medical Record Number: [id number] Patient Account Number: [id number] Date of Birth/Sex: Treating RN: 03/11/1946 (76 y.o. 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 - Dr. [name] Rm 4 Mon. Nov. 6th at 3:15pm Return A Bathing/ Shower/ Hygiene May shower with protection but do not get wound dressing(s) wet. Negative Presssure Wound Therapy SNAP Vac to wound continuously at 121m/hg pressure Blue Foam Edema Control - Lymphedema / SCD / Other Left Lower Extremity Elevate legs to the level of the heart or above for 30 minutes daily and/or when sitting, a frequency of: Avoid standing for long periods of time. Patient to wear own compression stockings  every day. Compression stocking or Garment 20-30 mm/Hg pressure to: - left leg Home Health dmit to Home Health for wound care. May utilize formulary equivalent dressing for wound treatment orders unless otherwise specified. - A For skilled nursing for wound care. New wound care orders this week; continue Home Health for wound care. May utilize formulary equivalent dressing for wound treatment orders unless otherwise specified. - Skilled nursing referral for wound care Wound Treatment Wound #1 - Foot Wound Laterality: Dorsal, Right Cleanser: Soap and Water 1 x Per Day/30 Days Discharge Instructions: May shower and wash wound with dial antibacterial soap and water prior to dressing change. Cleanser: Wound Cleanser 1 x Per Day/30 Days Discharge Instructions: Cleanse the wound with wound cleanser prior to applying a clean dressing using gauze sponges, not tissue or cotton balls. BREANNAH, KRATT ([id number]) [id number] Page 4 of 9 Topical: Skintegrity Hydrogel 4 (oz) 1 x Per Day/30 Days Discharge Instructions: Apply hydrogel as directed Prim Dressing: Promogran Prisma Matrix, 4.34 (sq in) (silver collagen) 1 x Per Day/30 Days ary Discharge Instructions: Moisten collagen with saline or hydrogel Secured With: Transpore Surgical Tape, 2x10 (in/yd) 1 x Per Day/30 Days Discharge Instructions: Secure dressing with tape as  directed. Compression Wrap: Kerlix Roll 4.5x3.1 (in/yd) (Generic) 1 x Per Day/30 Days Discharge Instructions: Apply Kerlix and Coban compression as directed. Electronic Signature(s) Signed: 10/20/2022 4:44:40 PM By: Dellie Catholic RN Signed: 10/23/2022 7:37:42 AM By: Fredirick Maudlin MD FACS Previous Signature: 10/20/2022 2:19:05 PM Version By: Fredirick Maudlin MD FACS Entered By: Dellie Catholic on 10/20/2022 15:59:45 -------------------------------------------------------------------------------- Problem List Details Patient Name: Date of Service: CHERYLYNN, LISZEWSKI [name] 10/20/2022 1:00 PM Medical Record Number: [id number] Patient Account Number: [id number] Date of Birth/Sex: Treating RN: [birth year] (76 y.o. 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 09/20/2022 No Yes without evidence of necrosis L97.812 Non-pressure chronic ulcer of other part of right lower leg with fat layer 09/20/2022 No Yes exposed N82.95 Chronic systolic (congestive) heart failure 09/20/2022 No Yes M06.9 Rheumatoid arthritis, unspecified 09/20/2022 No Yes Z79.52 Long term (current) use of systemic steroids 09/20/2022 No Yes I70.90 Unspecified atherosclerosis 09/20/2022 No Yes 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 10/20/2022 1:00 PM Medical Record Number: [id number] Patient Account Number: [id number] Date of Birth/Sex: Treating RN: 1946-01-25 (76 y.o. F) Primary Care Provider: [name] Other Clinician: Referring Provider: Treating Provider/Extender: [name] in Treatment: 4 Subjective Chief Complaint Information obtained from Patient Patient seen for complaints of Non-Healing Wounds. History of Present Illness (HPI) ADMISSION 09/20/2022 This is a 76 year old woman with a past medical history significant for congestive heart failure, atrial fibrillation, rheumatoid arthritis on long-term steroid treatment, CMML currently receiving chemotherapy and moderate malnutrition. She presents to clinic today with a wound on the dorsum of her right foot. She says it started out as a bruise and then developed a bump which subsequently broke open. She says she was sent to our clinic by her oncologist. She is not diabetic and does not  smoke. ABI in clinic today was 1.55. She has had formal segmental arterial Dopplers done, a little over a year ago, which did not show any evidence of occlusive vascular disease. On the dorsal aspect of the right foot, there is a wound with a lot of old hematoma, slough, eschar, and and nonviable subcutaneous tissue. T the best of my o ability to discern, it does appear to involve the muscle layer, but there is no evidence of necrosis. The wound is pouring serous fluid; the patient has 3+ pitting edema to the bilateral lower extremities. 09/29/2022: The patient came to clinic today with nothing but a T overlying her wound. She is accompanied by her husband today. She has not elfa communicated with her cardiologist regarding her fluid overload. She still has 3+ pitting edema bilaterally. The wound is still pouring serous fluid, although not quite as effusively as at her initial visit. There is still quite  a bit of nonviable tissue and hematoma present. 10/13; patient's wound on the right lateral lower leg his eschared over and may well be on its way to healing. The real problem here is on the right dorsal foot just proximal to her toes. The patient is still puzzled by the cause of this. Looking at a picture on the patient's smart phone there is a suggestion that this might have been a hematoma which seems to fit what Dr. [name] had felt on her initial evaluation. They have been using iodoform packing kerlix and Coban. 10/20; right lower leg is healed. We are approved for snap VAC on the right foot. We will try to arrange home health but until then she is going to need a nurse visit on Tuesday 10/20/2022: It has been 2 weeks since I have seen the wound. It has improved significantly since then. She is currently in a snap VAC and I think this has made quite a difference. The overall wound measurements are smaller. There is no massive drainage, as there had been previously. There is some good granulation tissue forming underneath a bit of slough. Patient History Information obtained from Patient. Family History Unknown History, Diabetes - Father, Heart Disease - Father, No family history of Cancer. Social History Former smoker - quit 2003, Marital Status - Married, Alcohol Use - Daily, Drug Use - Prior History, Caffeine Use - Daily. Medical History Eyes Denies history of Cataracts, Glaucoma, Optic Neuritis Ear/Nose/Mouth/Throat Denies history of Chronic sinus problems/congestion, Middle ear problems Respiratory Patient has history of Asthma, Chronic Obstructive Pulmonary Disease (COPD) Cardiovascular Patient has history of Congestive Heart Failure, Hypertension Denies history of Coronary Artery Disease, Deep Vein Thrombosis, Hypotension, Myocardial Infarction Endocrine Denies history of Type I Diabetes, Type II Diabetes Immunological Denies history of Lupus Erythematosus, Raynaudoos,  Scleroderma Integumentary (Skin) Denies history of History of Burn Musculoskeletal DIORA, BELLIZZI ([id number]) [id number] Page 6 of 9 Patient has history of Rheumatoid Arthritis Denies history of Gout Neurologic Patient has history of Neuropathy Oncologic Patient has history of Received Chemotherapy - currently receiving Hospitalization/Surgery History - reverse shoulder arthroplasty Rt- 2018. Medical A Surgical History Notes nd Cardiovascular AFIB Genitourinary Stage 3 CKD Objective Constitutional No acute distress.. Vitals Time Taken: 1:07 PM, Height: 64 in, Weight: 111 lbs, BMI: 19.1, Temperature: 98.2 F, Pulse: 92 bpm, Respiratory Rate: 20 breaths/min, Blood Pressure: 126/77 mmHg. Respiratory Normal work of breathing on room air.. General Notes: 10/20/2022: It has been 2 weeks since I have seen the wound. It has improved significantly since then. The overall wound measurements are  smaller. There is no massive drainage, as there had been previously. There is some good granulation tissue forming underneath a bit of slough. Integumentary (Hair, Skin) Wound #1 status is Open. Original cause of wound was Blister. The date acquired was: 08/23/2022. The wound has been in treatment 4 weeks. The wound is located on the Right,Dorsal Foot. The wound measures 1.2cm length x 1.2cm width x 0.5cm depth; 1.131cm^2 area and 0.565cm^3 volume. There is Fat Layer (Subcutaneous Tissue) exposed. There is no undermining noted, however, there is tunneling at 12:00 with a maximum distance of 0.8cm. There is a medium amount of serosanguineous drainage noted. The wound margin is distinct with the outline attached to the wound base. There is medium (34-66%) red granulation within the wound bed. There is a medium (34-66%) amount of necrotic tissue within the wound bed including Adherent Slough. The periwound skin appearance had no abnormalities noted for texture. The periwound  skin appearance had no abnormalities noted for moisture. The periwound skin appearance exhibited: Erythema. The surrounding wound skin color is noted with erythema. Periwound temperature was noted as No Abnormality. 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 There was a Selective/Open Wound Non-Viable Tissue Debridement with a total area of 0.64 sq cm performed by Fredirick Maudlin, MD. With the following instrument(s): Curette Material removed includes [facility] after achieving pain control using Lidocaine 5% topical ointment. No specimens were taken. A time out was conducted at 13:25, prior to the start of the procedure. A Minimum amount of bleeding was controlled with Pressure. The procedure was tolerated well with a pain level of 0 throughout and a pain level of 0 following the procedure. Post Debridement Measurements: 1.2cm length x 1.2cm width x 0.8cm depth; 0.905cm^3 volume. Character of Wound/Ulcer Post Debridement requires further debridement. Post procedure Diagnosis Wound #1: Same as Pre-Procedure General Notes: Scribed for Dr. [name] by Blanche East, RN. Plan LYNESSA, ALMANZAR H ([id number]) [id number] Page 7 of 9 Follow-up Appointments: Return Appointment in 1 week. - Dr. [name] Rm 4 Mon. Nov. 6th at 3:15pm Bathing/ Shower/ Hygiene: May shower with protection but do not get wound dressing(s) wet. Negative Presssure Wound Therapy: SNAP Vac to wound continuously  at 1108m/hg pressure Blue Foam Edema Control - Lymphedema / SCD / Other: Elevate legs to the level of the heart or above for 30 minutes daily and/or when sitting, a frequency of: Avoid standing for long periods of time. Patient to wear own compression stockings every day. Compression stocking or Garment 20-30 mm/Hg pressure to: - left leg [facility] May utilize formulary equivalent dressing for wound treatment orders unless otherwise specified. - For skilled nursing for wound care. New wound care orders this week; continue Home Health for wound care. May utilize formulary equivalent dressing for wound treatment orders unless otherwise specified. - Skilled nursing referral for wound care WOUND #1: - Foot Wound Laterality: Dorsal, Right Cleanser: Soap and Water 1 x Per Day/30 Days Discharge Instructions: May shower and wash wound with dial antibacterial soap and water prior to dressing change. Cleanser: Wound Cleanser 1 x Per Day/30  Days Discharge Instructions: Cleanse the wound with wound cleanser prior to applying a clean dressing using gauze sponges, not tissue or cotton balls. Topical: Skintegrity Hydrogel 4 (oz) 1 x Per Day/30 Days Discharge Instructions: Apply hydrogel as directed Prim Dressing: Promogran Prisma Matrix, 4.34 (sq in) (silver collagen) 1 x Per Day/30 Days ary Discharge Instructions: Moisten collagen with saline or hydrogel Secured With: Transpore Surgical T ape, 2x10 (in/yd) 1 x Per Day/30 Days Discharge Instructions: Secure dressing with tape as directed. Com pression Wrap: Kerlix Roll 4.5x3.1 (in/yd) (Generic) 1 x Per Day/30 Days Discharge Instructions: Apply Kerlix and Coban compression as directed. 10/20/2022: It has been 2 weeks since I have seen the wound. It has improved significantly since then. The overall wound measurements are smaller. There is no massive drainage, as there had been previously. There is some good  granulation tissue forming underneath a bit of slough. I used a curette to debride slough from the wound surface. We will continue to use Prisma silver collagen with the snap VAC. Apparently her rheumatologist has questions about whether or not she can receive her Remicade infusion; we discussed the pros and cons of doing this and the patient asked me to contact Dr. Trudie Reed. I will try to do that before her next appointment with me. Follow-up in 1 week. Electronic Signature(s) Signed: 10/20/2022 4:44:40 PM By: Dellie Catholic RN Signed: 10/23/2022 7:37:42 AM By: Fredirick Maudlin MD FACS Previous Signature: 10/20/2022 1:47:44 PM Version By: Fredirick Maudlin MD FACS Entered By: Dellie Catholic on 10/20/2022 16:00:04 -------------------------------------------------------------------------------- HxROS Details Patient Name: Date of Service: [name] [name] 10/20/2022 1:00 PM Medical Record Number: [id number] Patient Account Number: [id number] Date of Birth/Sex: Treating RN: Jul 31, 1946 (76 y.o. F) Primary Care Provider: [name] Other Clinician: Referring Provider: Treating Provider/Extender: [name] in Treatment: 4 Information Obtained From Patient Eyes Medical History: Negative for: Cataracts; Glaucoma; Optic Neuritis Ear/Nose/Mouth/Throat Medical History: Negative for: Chronic sinus problems/congestion; Middle ear problems Respiratory Medical HistoryTERRIANNE, CAVNESS ([id number]) [id number] Page 8 of 9 Positive for: Asthma; Chronic Obstructive Pulmonary Disease (COPD) Cardiovascular Medical History: Positive for: Congestive Heart Failure; Hypertension Negative for: Coronary Artery Disease; Deep Vein Thrombosis; Hypotension; Myocardial Infarction Past Medical History Notes: AFIB Endocrine Medical History: Negative for: Type I Diabetes; Type II Diabetes Genitourinary Medical History: Past Medical History  Notes: Stage 3 CKD Immunological Medical History: Negative for: Lupus Erythematosus; Raynauds; Scleroderma Integumentary (Skin) Medical History: Negative for: History of Burn Musculoskeletal Medical History: Positive for: Rheumatoid Arthritis Negative for: Gout Neurologic Medical History: Positive for: Neuropathy Oncologic Medical History: Positive for: Received Chemotherapy - currently receiving Immunizations Pneumococcal Vaccine: Received Pneumococcal Vaccination: Yes Received Pneumococcal Vaccination On or After 60th Birthday: Yes Implantable Devices None Hospitalization / Surgery History Type of Hospitalization/Surgery reverse shoulder arthroplasty Rt- 2018 Family and Social History Unknown History: Yes; Cancer: No; Diabetes: Yes - Father; Heart Disease: Yes - Father; Former smoker - quit 2003; Marital Status - Married; Alcohol Use: Daily; Drug Use: Prior History; Caffeine Use: Daily; Financial Concerns: No; Food, Clothing or Shelter Needs: No; Support System Lacking: No; Transportation Concerns: No Electronic Signature(s) Signed: 10/20/2022 2:19:05 PM By: Fredirick Maudlin MD FACS Entered By: Fredirick [place] on 10/20/2022 13:44:53 Scheidt, [name] ([id number]) [id number] Page 9 of 9 -------------------------------------------------------------------------------- SuperBill Details Patient Name: Date of Service: OVEDA, DADAMO 10/20/2022 Medical Record Number: [id number] Patient Account Number: [id number] Date of Birth/Sex: Treating RN: 1946-05-09 (76 y.o. 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 Facility Procedures : CPT4 Code: 25003704 Description: 88891 - DEBRIDE WOUND 1ST 20 SQ CM OR < ICD-10 Diagnosis Description 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 wit Modifier: hout evidence of n Quantity: 1 ecrosis Physician Procedures : CPT4 Code Description Modifier [id number] - WC PHYS LEVEL 4 - EST PT 25 ICD-10 Diagnosis Description 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 evidence of ne K34.91 Chronic systolic (congestive)  heart failure M06.9 Rheumatoid arthritis, unspecified Z79.52 Long term (current) use of systemic steroids Quantity: 1 crosis : [id number] - WC PHYS DEBR WO ANESTH 20 SQ CM ICD-10 Diagnosis Description 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

## 2022-10-25 ENCOUNTER — Ambulatory Visit (INDEPENDENT_AMBULATORY_CARE_PROVIDER_SITE_OTHER): Payer: Medicare Other

## 2022-10-25 ENCOUNTER — Ambulatory Visit (INDEPENDENT_AMBULATORY_CARE_PROVIDER_SITE_OTHER): Payer: Medicare Other | Admitting: Nurse Practitioner

## 2022-10-25 ENCOUNTER — Encounter: Payer: Self-pay | Admitting: Nurse Practitioner

## 2022-10-25 VITALS — BP 98/64 | HR 79 | Ht 62.0 in | Wt 104.0 lb

## 2022-10-25 DIAGNOSIS — J189 Pneumonia, unspecified organism: Secondary | ICD-10-CM

## 2022-10-25 DIAGNOSIS — J449 Chronic obstructive pulmonary disease, unspecified: Secondary | ICD-10-CM

## 2022-10-25 DIAGNOSIS — J90 Pleural effusion, not elsewhere classified: Secondary | ICD-10-CM | POA: Diagnosis not present

## 2022-10-25 DIAGNOSIS — J9811 Atelectasis: Secondary | ICD-10-CM | POA: Diagnosis not present

## 2022-10-25 NOTE — Patient Instructions (Addendum)
Continue Stiolto 2 puffs daily  Continue Albuterol inhaler 2 puffs or 3 mL neb every 6 hours as needed for shortness of breath or wheezing. Notify if symptoms persist despite rescue inhaler/neb use.  Follow up in 6 months with Dr. Valeta Harms. If symptoms worsen, please contact office for sooner follow up or seek emergency care.

## 2022-10-25 NOTE — Assessment & Plan Note (Signed)
Diagnosed 9/15. Completed abx therapy. Radiographic and clinical improvement. She has some mild residual atelectasis; activity and deep breathing exercises encouraged.   Patient Instructions  Continue Stiolto 2 puffs daily  Continue Albuterol inhaler 2 puffs or 3 mL neb every 6 hours as needed for shortness of breath or wheezing. Notify if symptoms persist despite rescue inhaler/neb use.  Follow up in 6 months with Dr. Valeta Harms. If symptoms worsen, please contact office for sooner follow up or seek emergency care.

## 2022-10-25 NOTE — Progress Notes (Signed)
@Patient  ID: [name], female    DOB: 06-18-1946, 76 y.o.   MRN: [id number]  Chief Complaint  Patient presents with   Follow-up    Pt for COPD, denies SOB, coughing/wheezing. No concerns at the moment    Referring provider: [name], MD  HPI: 76 year old female, former smoker followed for COPD Gold 2 and lung nodules. She is a patient of Dr. Juline Patch and last seen in office 09/27/2022. Past medical history significant for rheumatoid arthritis on remicade and daily prednisone, PAF, CHF, celiac disease, GERD, CKD stage 3. She is followed by Dr. Trudie Reed with rheumatology. 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 Morey Hummingbird.   TEST/EVENTS:  05/03/2021 PFT: FVC 80, FEV1 73, ratio 68, TLC 89, DLCOcor 55. No BD 03/29/2022 CTA chest: mild cardiomegaly. moderate right and trace left pleural effusions. Patchy airspace opacities in RLL and LUL; mild opacity in LLL. Numerous right-sided pleural based mases increased in size. Right sided paraspinal soft tissue mass at the T10 level, now 4.7x2. 03/30/2022 echo: 45-50%. Global hypokinesis. RV function mildly reduced. RV size nl. Mildly elevated PASP. Mild to moderate MR.   09/27/2022: OV with Dr. Valeta Harms. Last seen in March. 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 She also had a paraspinal soft tissue mass at the level of T10. Diagnosed via CT guided tissue biopsy. Currently undergoing chemo treatments for CMML. Recently had b/l pneumonia, worse on right than left. Plan to repeat CXR in 4 weeks for 6 week f/u.   10/25/2022: Today - follow up Patient presents today for follow up to assess for resolution of previously treated CAP. She is doing quite well from a pulmonary standpoint. No significant dyspnea, cough or chest congestion. She does have some fatigue but attributes that to her CMML treatments; unchanged. No fevers, chills, hemoptysis. Continues on Stiolto daily. Rarely uses  albuterol. Currently undergoing treatment for a non-healing RLE wound.   Allergies  Allergen Reactions   Digoxin And Related Rash    Generalized total body rash with immense itching   Gluten Meal Diarrhea   Penicillins Rash and Other (See Comments)      Did it involve swelling of the face/tongue/throat, SOB, or low BP? No Did it involve sudden or severe rash/hives, skin peeling, or any reaction on the inside of your mouth or nose? Yes Did you need to seek medical attention at a hospital or doctor's office? No When did it last happen?    "Many Years Ago"  If all above answers are "NO", may proceed with cephalosporin use.      Alendronate Rash   Hydroxychloroquine Rash    Immunization History  Administered Date(s) Administered   DTaP / Hep B / IPV 09/03/2018, 03/17/2019, 09/26/2019, 03/29/2020, 10/08/2020, 04/11/2021   Fluad Quad(high Dose 65+) 09/25/2019   H1N1 11/17/2008   Influenza Split 10/28/2009, 10/27/2010   Influenza, High Dose Seasonal PF 10/05/2015, 09/15/2016, 09/17/2017, 09/03/2018, 09/06/2019, 10/08/2020   Influenza,inj,quad, With Preservative 10/29/2014   Influenza-Unspecified 09/19/2012, 10/15/2013   PFIZER(Purple Top)SARS-COV-2 Vaccination 01/16/2020, 02/06/2020, 08/16/2020   Pneumococcal Conjugate-13 07/14/2014   Pneumococcal Polysaccharide-23 07/09/2012   Td 05/27/2004   Tdap 07/09/2012, 11/10/2019   Zoster, Live 08/22/2012    Past Medical History:  Diagnosis Date   Arthritis    Rheumatoid arthritis   Celiac disease    Chronic kidney disease    stage 3 from MD notes   COPD (chronic obstructive pulmonary disease) ([place])  Dyspnea    with going up stairs   Family history of adverse reaction to anesthesia    father had hard time waking up   Headache    sinus headaches   Hot flashes    Hypertension    Iron deficiency anemia    Pneumonia    per patient "I have walking pneumonia"    Tobacco History: Social History   Tobacco Use  Smoking Status  Former   Packs/day: 1.00   Years: 30.00   Total pack years: 30.00   Types: Cigarettes   Quit date: 2005   Years since quitting: 18.8  Smokeless Tobacco Never   Counseling given: Not Answered   Outpatient Medications Prior to Visit  Medication Sig Dispense Refill   denosumab (PROLIA) 60 MG/ML SOSY injection Inject 60 mg into the skin every 6 (six) months.     diltiazem (CARDIZEM CD) 240 MG 24 hr capsule Take 1 capsule (240 mg total) by mouth daily. 30 capsule 6   fluconazole (DIFLUCAN) 150 MG tablet Take 150 mg by mouth daily.     furosemide (LASIX) 20 MG tablet Take 1 tablet (20 mg total) by mouth daily. 30 tablet 11   gabapentin (NEURONTIN) 300 MG capsule Take 300 mg by mouth in the morning.     InFLIXimab (REMICADE IV) Inject 10 mg into the vein every 2 (two) months.     Metoprolol Tartrate 75 MG TABS Take 150 mg by mouth 2 (two) times daily. 360 tablet 3   nystatin cream (MYCOSTATIN) Apply topically 2 (two) times daily.     potassium chloride SA (KLOR-CON M20) 20 MEQ tablet Take 1 tablet (20 mEq total) by mouth daily. Take with furosemide 90 tablet 3   predniSONE (DELTASONE) 5 MG tablet Take 5 mg by mouth daily with breakfast.     Tiotropium Bromide-Olodaterol (STIOLTO RESPIMAT) 2.5-2.5 MCG/ACT AERS Inhale 2 puffs into the lungs daily. 4 g 5   mirtazapine (REMERON) 7.5 MG tablet Take 1 tablet (7.5 mg total) by mouth at bedtime. 30 tablet 0   Facility-Administered Medications Prior to Visit  Medication Dose Route Frequency Provider Last Rate Last Admin   acetaminophen (TYLENOL) 325 MG tablet            diphenhydrAMINE (BENADRYL) 25 mg capsule              Review of Systems:   Constitutional: No weight loss or gain, night sweats, fevers, chills. +fatigue, lassitude. HEENT: No headaches, difficulty swallowing, tooth/dental problems, or sore throat. No sneezing, itching, ear ache, nasal congestion, or post nasal drip CV:  No chest pain, orthopnea, PND, swelling in lower  extremities, anasarca, dizziness, palpitations, syncope Resp: No shortness of breath with exertion or at rest. No excess mucus or change in color of mucus. No productive or non-productive. No hemoptysis. No wheezing.  No chest wall deformity Skin: +unhealing RLL wound; [place] in place.  Neuro: No dizziness or lightheadedness.  Psych: No depression or anxiety. Mood stable.     Physical Exam:  BP 98/64   Pulse 79   Ht 5' 2"  (1.575 m)   Wt 104 lb (47.2 kg)   SpO2 94%   BMI 19.02 kg/m   GEN: Pleasant, interactive, chronically-ill appearing; elderly, frail; in no acute distress. HEENT:  Normocephalic and atraumatic. PERRLA. Sclera white. Nasal turbinates pink, moist and patent bilaterally. No rhinorrhea present. Oropharynx pink and moist, without exudate or edema. No lesions, ulcerations, or postnasal drip.  NECK:  Supple w/ fair ROM. No  JVD present. Normal carotid impulses w/o bruits. Thyroid symmetrical with no goiter or nodules palpated. No lymphadenopathy.   CV: RRR, no m/r/g, no peripheral edema. Pulses intact, +2 bilaterally. No cyanosis, pallor or clubbing. PULMONARY:  Unlabored, regular breathing. Clear bilaterally A&P w/o wheezes/rales/rhonchi. No accessory muscle use.  GI: BS present and normoactive. Soft, non-tender to palpation. No organomegaly or masses detected.  MSK: No deformities or joint swelling noted.  Neuro: A/Ox3. No focal deficits noted.   Psych: Normal affect and behavior. Judgement and thought content appropriate.     Lab Results:  CBC    Component Value Date/Time   WBC 4.6 10/05/2022 1144   RBC 2.70 (L) 10/05/2022 1144   HGB 9.5 (L) 10/05/2022 1144   HGB 10.0 (L) 09/18/2022 1217   HGB 10.8 (L) 01/03/2017 1246   HCT 29.6 (L) 10/05/2022 1144   HCT 32.8 (L) 01/03/2017 1246   PLT 41 (L) 10/05/2022 1144   PLT 172 09/18/2022 1217   PLT 163 01/03/2017 1246   MCV 109.6 (H) 10/05/2022 1144   MCV 102.8 (H) 01/03/2017 1246   MCH 35.2 (H) 10/05/2022 1144   MCHC  32.1 10/05/2022 1144   RDW 17.5 (H) 10/05/2022 1144   RDW 14.3 01/03/2017 1246   LYMPHSABS 1.0 10/05/2022 1144   LYMPHSABS 2.9 01/03/2017 1246   MONOABS 1.3 (H) 10/05/2022 1144   MONOABS 1.1 (H) 01/03/2017 1246   EOSABS 0.1 10/05/2022 1144   EOSABS 0.1 01/03/2017 1246   BASOSABS 0.0 10/05/2022 1144   BASOSABS 0.0 01/03/2017 1246    BMET    Component Value Date/Time   NA 137 10/05/2022 1144   NA 141 01/04/2016 1001   K 4.3 10/05/2022 1144   K 4.5 01/04/2016 1001   CL 101 10/05/2022 1144   CO2 28 10/05/2022 1144   CO2 24 01/04/2016 1001   GLUCOSE 128 (H) 10/05/2022 1144   GLUCOSE 105 01/04/2016 1001   BUN 20 10/05/2022 1144   BUN 21.7 01/04/2016 1001   CREATININE 0.94 10/05/2022 1144   CREATININE 1.2 (H) 01/04/2016 1001   CALCIUM 9.0 10/05/2022 1144   CALCIUM 9.7 01/04/2016 1001   GFRNONAA >60 10/05/2022 1144   GFRAA 56 (L) 01/09/2020 1339    BNP    Component Value Date/Time   BNP 148.4 (H) 05/21/2022 0345     Imaging:  DG Chest 2 View  Result Date: 10/25/2022 CLINICAL DATA:  Pneumonia. EXAM: CHEST - 2 VIEW COMPARISON:  September 08, 2022. FINDINGS: Stable cardiomediastinal silhouette. Right internal jugular Port-A-Cath is unchanged. Status post right shoulder arthroplasty. Minimal bibasilar subsegmental atelectasis is noted. Small bilateral pleural effusions are noted. IMPRESSION: Minimal bibasilar subsegmental atelectasis is noted with small bilateral pleural effusions. Electronically Signed   By: Marijo Conception M.D.   On: 10/25/2022 11:54    azaCITIDine (VIDAZA) 115 mg in sodium chloride 0.9 % 50 mL chemo infusion     Date Action Dose Route User   09/18/2022 1434 Infusion Verify (none) Intravenous Person, Blaine Hamper, RN   09/18/2022 1433 Rate/Dose Change (none) Intravenous Person, Blaine Hamper, RN   09/18/2022 1433 New Bag/Given 115 mg Intravenous Person, Brooke A, RN      azaCITIDine (VIDAZA) 115 mg in sodium chloride 0.9 % 50 mL chemo infusion     Date Action  Dose Route User   09/19/2022 1457 Rate/Dose Change (none) Intravenous Rafael Bihari, RN   09/19/2022 1457 Rate/Dose Change (none) Intravenous Rafael Bihari, RN   09/19/2022 1442 Rate/Dose Change (none) Intravenous Kerscher,  Lacey Jensen, RN   09/19/2022 1442 New Bag/Given 115 mg Intravenous Rafael Bihari, RN      azaCITIDine (VIDAZA) 115 mg in sodium chloride 0.9 % 50 mL chemo infusion     Date Action Dose Route User   09/20/2022 1424 Infusion Verify (none) Intravenous Juanetta Gosling, RN   09/20/2022 1424 Rate/Dose Change (none) Intravenous Juanetta Gosling, RN   09/20/2022 1423 New Bag/Given 115 mg Intravenous Juanetta Gosling, RN      azaCITIDine (VIDAZA) 115 mg in sodium chloride 0.9 % 50 mL chemo infusion     Date Action Dose Route User   09/21/2022 1457 Infusion Verify (none) Intravenous Charleston Poot, RN   09/21/2022 1457 Rate/Dose Change (none) Intravenous Charleston Poot, RN   09/21/2022 1457 New Bag/Given 115 mg Intravenous Charleston Poot, RN      azaCITIDine (VIDAZA) 115 mg in sodium chloride 0.9 % 50 mL chemo infusion     Date Action Dose Route User   09/22/2022 1435 Infusion Verify (none) Intravenous Dionne Ano, RN   09/22/2022 1421 Rate/Dose Change (none) Intravenous Dionne Ano, RN   09/22/2022 1420 New Bag/Given 115 mg Intravenous Dionne Ano, RN      famotidine (PEPCID) IVPB 20 mg premix     Date Action Dose Route User   09/19/2022 1534 Infusion Verify (none) Intravenous Rafael Bihari, RN   09/19/2022 1523 Rate/Dose Change (none) Intravenous Rafael Bihari, RN   09/19/2022 1523 New Bag/Given 20 mg Intravenous Rafael Bihari, RN      famotidine (PEPCID) IVPB 20 mg premix     Date Action Dose Route User   09/20/2022 1401 Rate/Dose Change (none) Intravenous Juanetta Gosling, RN   09/20/2022 1401 New Bag/Given 20 mg Intravenous Juanetta Gosling, RN      heparin lock flush 100 unit/mL     Date Action Dose Route User   09/04/2022 1257  Given 500 Units Intracatheter Gobble, Jasmine A, LPN      heparin lock flush 100 unit/mL     Date Action Dose Route User   09/18/2022 1452 Given 500 Units Intracatheter Person, Brooke A, RN      heparin lock flush 100 unit/mL     Date Action Dose Route User   09/19/2022 1542 Given 500 Units Intracatheter Rafael Bihari, RN      heparin lock flush 100 unit/mL     Date Action Dose Route User   09/20/2022 1445 Given 500 Units Intracatheter Juanetta Gosling, RN      heparin lock flush 100 unit/mL     Date Action Dose Route User   09/21/2022 1514 Given 500 Units Intracatheter Charleston Poot, RN      ondansetron [facility]) tablet 8 mg     Date Action Dose Route User   09/18/2022 1346 Given 8 mg Oral Person, Brooke A, RN      ondansetron [facility]) tablet 8 mg     Date Action Dose Route User   09/19/2022 1403 Given 8 mg Oral Rafael Bihari, RN      0.9 %  sodium chloride infusion     Date Action Dose Route User   09/18/2022 1457 Rate/Dose Change (none) Intravenous Person, Blaine Hamper, RN   09/18/2022 1452 Rate/Dose Change (none) Intravenous Person, Blaine Hamper, RN   09/18/2022 1342 New Bag/Given (none) Intravenous Person, Brooke A, RN      0.9 %  sodium chloride infusion     Date  Action Dose Route User   09/19/2022 1505 Rate/Dose Change (none) Intravenous Rafael Bihari, RN   09/19/2022 1500 Rate/Dose Change (none) Intravenous Rafael Bihari, RN   09/19/2022 1447 Rate/Dose Change (none) Intravenous Rafael Bihari, RN   09/19/2022 1434 Infusion Verify (none) Intravenous Rafael Bihari, RN   09/19/2022 1434 Infusion Verify (none) Intravenous Rafael Bihari, RN      0.9 %  sodium chloride infusion     Date Action Dose Route User   09/20/2022 1441 Rate/Dose Change (none) Intravenous Juanetta Gosling, RN   09/20/2022 1422 Infusion Verify (none) Intravenous Juanetta Gosling, RN   09/20/2022 1422 Rate/Dose Change (none) Intravenous Juanetta Gosling, RN   09/20/2022 1417  Rate/Dose Change (none) Intravenous Juanetta Gosling, RN   09/20/2022 1416 Restarted (none) Intravenous Juanetta Gosling, RN      0.9 %  sodium chloride infusion     Date Action Dose Route User   09/21/2022 1520 Rate/Dose Change (none) Intravenous Charleston Poot, RN   09/21/2022 1514 Rate/Dose Change (none) Intravenous Charleston Poot, RN   09/21/2022 1356 New Bag/Given (none) Intravenous Charleston Poot, RN      0.9 %  sodium chloride infusion     Date Action Dose Route User   09/22/2022 1444 Rate/Dose Change (none) Intravenous Dionne Ano, RN   09/22/2022 1439 Rate/Dose Change (none) Intravenous Dionne Ano, RN   09/22/2022 1329 New Bag/Given (none) Intravenous Dionne Ano, RN      sodium chloride flush (NS) 0.9 % injection 10 mL     Date Action Dose Route User   09/04/2022 1200 Given 10 mL Intracatheter Cates, Porsche L, LPN      sodium chloride flush (NS) 0.9 % injection 10 mL     Date Action Dose Route User   09/18/2022 1227 Given 10 mL Intracatheter Cates, Porsche L, LPN      sodium chloride flush (NS) 0.9 % injection 10 mL     Date Action Dose Route User   09/18/2022 1452 Given 10 mL Intracatheter Person, Brooke A, RN      sodium chloride flush (NS) 0.9 % injection 10 mL     Date Action Dose Route User   09/19/2022 1542 Given 10 mL Intracatheter Rafael Bihari, RN      sodium chloride flush (NS) 0.9 % injection 10 mL     Date Action Dose Route User   09/20/2022 1445 Given 10 mL Intracatheter Alinda Sierras M, RN      sodium chloride flush (NS) 0.9 % injection 10 mL     Date Action Dose Route User   09/21/2022 1514 Given 10 mL Intracatheter Charleston Poot, RN          Latest Ref Rng & Units 05/03/2021    1:55 PM 01/26/2016    3:31 PM  PFT Results  FVC-Pre L 2.19  2.31   FVC-Predicted Pre % 80  79   FVC-Post L 2.21  2.45   FVC-Predicted Post % 80  84   Pre FEV1/FVC % % 68  69   Post FEV1/FCV % % 68  69   FEV1-Pre L 1.49  1.60    FEV1-Predicted Pre % 72  72   FEV1-Post L 1.51  1.70   DLCO uncorrected ml/min/mmHg 10.27  12.99   DLCO UNC% % 55  56   DLCO corrected ml/min/mmHg 10.27    DLCO COR %Predicted % 55    DLVA Predicted %  72  70   TLC L 4.39  4.25   TLC % Predicted % 89  86   RV % Predicted % 100  84     No results found for: "NITRICOXIDE"      Assessment & Plan:   Multifocal pneumonia Diagnosed 9/15. Completed abx therapy. Radiographic and clinical improvement. She has some mild residual atelectasis; activity and deep breathing exercises encouraged.   Patient Instructions  Continue Stiolto 2 puffs daily  Continue Albuterol inhaler 2 puffs or 3 mL neb every 6 hours as needed for shortness of breath or wheezing. Notify if symptoms persist despite rescue inhaler/neb use.  Follow up in 6 months with Dr. Valeta Harms. If symptoms worsen, please contact office for sooner follow up or seek emergency care.     Stage 2 moderate COPD by GOLD classification (HCC) Moderate obstruction. Relatively low symptom burden. She is compensated on current regimen with Stiolto.   I spent 25 minutes of dedicated to the care of this patient on the date of this encounter to include pre-visit review of records, face-to-face time with the patient discussing conditions above, post visit ordering of testing, clinical documentation with the electronic health record, making appropriate referrals as documented, and communicating necessary findings to members of the patients care team.  Clayton Bibles, NP 10/25/2022  Pt aware and understands NP's role.

## 2022-10-25 NOTE — Assessment & Plan Note (Signed)
Moderate obstruction. Relatively low symptom burden. She is compensated on current regimen with Stiolto.

## 2022-10-30 ENCOUNTER — Inpatient Hospital Stay (HOSPITAL_BASED_OUTPATIENT_CLINIC_OR_DEPARTMENT_OTHER): Payer: Medicare Other | Admitting: Hematology

## 2022-10-30 ENCOUNTER — Inpatient Hospital Stay: Payer: Medicare Other | Attending: Nurse Practitioner

## 2022-10-30 ENCOUNTER — Encounter: Payer: Self-pay | Admitting: Hematology

## 2022-10-30 ENCOUNTER — Inpatient Hospital Stay: Payer: Medicare Other

## 2022-10-30 ENCOUNTER — Encounter (HOSPITAL_BASED_OUTPATIENT_CLINIC_OR_DEPARTMENT_OTHER): Payer: Medicare Other | Attending: General Surgery | Admitting: General Surgery

## 2022-10-30 ENCOUNTER — Ambulatory Visit: Payer: Medicare Other | Admitting: Cardiovascular Disease

## 2022-10-30 VITALS — BP 107/62 | HR 69 | Temp 97.7°F | Resp 18 | Ht 62.0 in | Wt 107.6 lb

## 2022-10-30 DIAGNOSIS — L97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 evidence of necrosis: Secondary | ICD-10-CM | POA: Insufficient documentation

## 2022-10-30 DIAGNOSIS — L97812 Non-pressure chronic ulcer of other part of right lower leg with fat layer exposed: Secondary | ICD-10-CM | POA: Diagnosis not present

## 2022-10-30 DIAGNOSIS — Z87891 Personal history of nicotine dependence: Secondary | ICD-10-CM | POA: Insufficient documentation

## 2022-10-30 DIAGNOSIS — Z95828 Presence of other vascular implants and grafts: Secondary | ICD-10-CM

## 2022-10-30 DIAGNOSIS — E1122 Type 2 diabetes mellitus with diabetic chronic kidney disease: Secondary | ICD-10-CM | POA: Diagnosis not present

## 2022-10-30 DIAGNOSIS — I4891 Unspecified atrial fibrillation: Secondary | ICD-10-CM | POA: Insufficient documentation

## 2022-10-30 DIAGNOSIS — C931 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: Secondary | ICD-10-CM | POA: Diagnosis not present

## 2022-10-30 DIAGNOSIS — J449 Chronic obstructive pulmonary disease, unspecified: Secondary | ICD-10-CM | POA: Insufficient documentation

## 2022-10-30 DIAGNOSIS — I5022 Chronic systolic (congestive) heart failure: Secondary | ICD-10-CM | POA: Diagnosis not present

## 2022-10-30 DIAGNOSIS — I890 Lymphedema, not elsewhere classified: Secondary | ICD-10-CM | POA: Diagnosis not present

## 2022-10-30 DIAGNOSIS — E11621 Type 2 diabetes mellitus with foot ulcer: Secondary | ICD-10-CM | POA: Diagnosis not present

## 2022-10-30 DIAGNOSIS — E1140 Type 2 diabetes mellitus with diabetic neuropathy, unspecified: Secondary | ICD-10-CM | POA: Insufficient documentation

## 2022-10-30 DIAGNOSIS — N183 Chronic kidney disease, stage 3 unspecified: Secondary | ICD-10-CM | POA: Diagnosis not present

## 2022-10-30 DIAGNOSIS — R21 Rash and other nonspecific skin eruption: Secondary | ICD-10-CM | POA: Diagnosis not present

## 2022-10-30 DIAGNOSIS — Z5111 Encounter for antineoplastic chemotherapy: Secondary | ICD-10-CM | POA: Insufficient documentation

## 2022-10-30 DIAGNOSIS — M069 Rheumatoid arthritis, unspecified: Secondary | ICD-10-CM | POA: Diagnosis not present

## 2022-10-30 DIAGNOSIS — Z7952 Long term (current) use of systemic steroids: Secondary | ICD-10-CM | POA: Diagnosis not present

## 2022-10-30 DIAGNOSIS — L97512 Non-pressure chronic ulcer of other part of right foot with fat layer exposed: Secondary | ICD-10-CM | POA: Diagnosis not present

## 2022-10-30 DIAGNOSIS — I130 Hypertensive heart and chronic kidney disease with heart failure and stage 1 through stage 4 chronic kidney disease, or unspecified chronic kidney disease: Secondary | ICD-10-CM | POA: Insufficient documentation

## 2022-10-30 DIAGNOSIS — I7090 Unspecified atherosclerosis: Secondary | ICD-10-CM | POA: Diagnosis not present

## 2022-10-30 LAB — BASIC METABOLIC PANEL - CANCER CENTER ONLY
Anion gap: 8 (ref 5–15)
BUN: 17 mg/dL (ref 8–23)
CO2: 26 mmol/L (ref 22–32)
Calcium: 9.1 mg/dL (ref 8.9–10.3)
Chloride: 100 mmol/L (ref 98–111)
Creatinine: 0.87 mg/dL (ref 0.44–1.00)
GFR, Estimated: >60 mL/min (ref >60)
Glucose, Bld: 128 mg/dL — ABNORMAL HIGH (ref 70–99)
Potassium: 5.1 mmol/L (ref 3.5–5.1)
Sodium: 134 mmol/L — ABNORMAL LOW (ref 135–145)

## 2022-10-30 LAB — CBC WITH DIFFERENTIAL (CANCER CENTER ONLY)
Abs Immature Granulocytes: 0.08 10*3/uL — ABNORMAL HIGH (ref 0.00–0.07)
Basophils Absolute: 0.2 10*3/uL — ABNORMAL HIGH (ref 0.0–0.1)
Basophils Relative: 2 %
Eosinophils Absolute: 0.6 10*3/uL — ABNORMAL HIGH (ref 0.0–0.5)
Eosinophils Relative: 6 %
HCT: 32.7 % — ABNORMAL LOW (ref 36.0–46.0)
Hemoglobin: 10.8 g/dL — ABNORMAL LOW (ref 12.0–15.0)
Immature Granulocytes: 1 %
Lymphocytes Relative: 11 %
Lymphs Abs: 1.2 10*3/uL (ref 0.7–4.0)
MCH: 36.1 pg — ABNORMAL HIGH (ref 26.0–34.0)
MCHC: 33 g/dL (ref 30.0–36.0)
MCV: 109.4 fL — ABNORMAL HIGH (ref 80.0–100.0)
Monocytes Absolute: 2.4 10*3/uL — ABNORMAL HIGH (ref 0.1–1.0)
Monocytes Relative: 22 %
Neutro Abs: 6.6 10*3/uL (ref 1.7–7.7)
Neutrophils Relative %: 58 %
Platelet Count: 122 10*3/uL — ABNORMAL LOW (ref 150–400)
RBC: 2.99 MIL/uL — ABNORMAL LOW (ref 3.87–5.11)
RDW: 15.5 % (ref 11.5–15.5)
WBC Count: 11 10*3/uL — ABNORMAL HIGH (ref 4.0–10.5)
nRBC: 0 % (ref 0.0–0.2)

## 2022-10-30 MED ORDER — SODIUM CHLORIDE 0.9 % IV SOLN: Freq: Once | INTRAVENOUS | Status: AC

## 2022-10-30 MED ORDER — HEPARIN SOD (PORK) LOCK FLUSH 100 UNIT/ML IV SOLN
500.0000 [IU] | Freq: Once | INTRAVENOUS | Status: AC | PRN
  Administered 2022-10-30: 500 [IU]

## 2022-10-30 MED ORDER — SODIUM CHLORIDE 0.9% FLUSH
10.0000 mL | INTRAVENOUS | Status: DC | PRN
  Administered 2022-10-30: 10 mL

## 2022-10-30 MED ORDER — SODIUM CHLORIDE 0.9 % IV SOLN
75.0000 mg/m2 | Freq: Once | INTRAVENOUS | Status: AC
  Administered 2022-10-30: 115 mg via INTRAVENOUS
  Filled 2022-10-30: qty 11.5

## 2022-10-30 MED ORDER — SODIUM CHLORIDE 0.9% FLUSH
10.0000 mL | Freq: Once | INTRAVENOUS | Status: AC
  Administered 2022-10-30: 10 mL

## 2022-10-30 NOTE — Progress Notes (Signed)
REDA, CITRON ([id number]) [id number] Page 1 of 9 Visit Report for 10/30/2022 Chief Complaint Document Details Patient Name: Date of Service: [name], [name] 10/30/2022 3:15 PM Medical Record Number: [id number] Patient Account Number: [id number] Date of Birth/Sex: Treating RN: 10-03-1946 (76 y.o. F) Primary Care Provider: [name] Other Clinician: Referring Provider: Treating Provider/Extender: [name] in Treatment: 5 Information Obtained from: Patient Chief Complaint Patient seen for complaints of Non-Healing Wounds. Electronic Signature(s) Signed: 10/30/2022 3:59:48 PM By: Fredirick Maudlin MD FACS Entered By: Fredirick Maudlin on 10/30/2022 15:59:48 -------------------------------------------------------------------------------- Debridement Details Patient Name: Date of Service: [name] [name] 10/30/2022 3:15 PM Medical Record Number: [id number] Patient Account Number: [id number] Date of Birth/Sex: Treating RN: 01-Aug-1946 (76 y.o. [name], [name] Primary Care Provider: [name] Other Clinician: Referring Provider: Treating Provider/Extender: [name] in Treatment: 5 Debridement Performed for Assessment: Wound #1 Right,Dorsal Foot Performed By: Physician Fredirick Maudlin, MD Debridement Type: Debridement Level of Consciousness (Pre-procedure): Awake and Alert Pre-procedure Verification/Time Out Yes - 15:45 Taken: Start Time: 15:46 Pain Control: Lidocaine 5% topical ointment T Area Debrided (L x W): otal 1.2 (cm) x 1 (cm) = 1.2 (cm) Tissue and other material debrided: Non-Viable, Slough, Slough Level: Non-Viable Tissue Debridement Description: Selective/Open Wound Instrument: Curette Bleeding: Minimum Hemostasis Achieved: Pressure Procedural Pain: 0 Post Procedural Pain: 0 Response to Treatment: Procedure was tolerated well Level of Consciousness (Post-  Awake and Alert procedure): Post Debridement Measurements of Total Wound Length: (cm) 1.2 Width: (cm) 1 Depth: (cm) 0.3 Volume: (cm) 0.283 Character of Wound/Ulcer Post Debridement: Requires Further Debridement Post Procedure Diagnosis Same as Pre-procedure Ary, Rudnick Enyla H ([id number]) [phone].pdf Page 2 of 9 Notes Scribed for Dr. [name] by Blanche East, RN Electronic Signature(s) Signed: 10/30/2022 4:03:30 PM By: Fredirick Maudlin MD FACS Signed: 10/30/2022 4:40:13 PM By: Blanche East RN Entered By: Blanche East on 10/30/2022 15:47:19 -------------------------------------------------------------------------------- HPI Details Patient Name: Date of Service: [name] [name] 10/30/2022 3:15 PM Medical Record Number: [id number] Patient Account Number: [id number] Date of Birth/Sex: Treating RN: 1946-07-07 (76 y.o. F) Primary Care Provider: [name] Other Clinician: Referring Provider: Treating Provider/Extender: [name] in Treatment: 5 History of Present Illness HPI Description: ADMISSION 09/20/2022 This is a 77 year old woman with a past medical history significant for congestive heart failure, atrial fibrillation, rheumatoid arthritis on long-term steroid treatment, CMML currently receiving chemotherapy and moderate malnutrition. She presents to clinic today with a wound on the dorsum of her right foot. She says it started out as a bruise and then developed a bump which subsequently broke open. She says she was sent to our clinic by her oncologist. She is not diabetic and does not smoke. ABI in clinic today was 1.55. She has had formal segmental arterial Dopplers done, a little over a year ago, which did not show any evidence of occlusive vascular disease. On the dorsal aspect of the right foot, there is a wound with a lot of old hematoma, slough, eschar, and and nonviable subcutaneous tissue. T the best of  my o ability to discern, it does appear to involve the muscle layer, but there is no evidence of necrosis. The wound is pouring serous fluid; the patient has 3+ pitting edema to the bilateral lower extremities. 09/29/2022: The patient came to clinic today with nothing but a T overlying her wound. She is accompanied by her husband today. She has not elfa communicated with her cardiologist regarding her fluid overload. She  still has 3+ pitting edema bilaterally. The wound is still pouring serous fluid, although not quite as effusively as at her initial visit. There is still quite a bit of nonviable tissue and hematoma present. 10/13; patient's wound on the right lateral lower leg his eschared over and may well be on its way to healing. The real problem here is on the right dorsal foot just proximal to her toes. The patient is still puzzled by the cause of this. Looking at a picture on the patient's smart phone there is a suggestion that this might have been a hematoma which seems to fit what Dr. [name] had felt on her initial evaluation. They have been using iodoform packing kerlix and Coban. 10/20; right lower leg is healed. We are approved for snap VAC on the right foot. We will try to arrange home health but until then she is going to need a nurse visit on Tuesday 10/20/2022: It has been 2 weeks since I have seen the wound. It has improved significantly since then. She is currently in a snap VAC and I think this has made quite a difference. The overall wound measurements are smaller. There is no massive drainage, as there had been previously. There is some good granulation tissue forming underneath a bit of slough. 10/30/2022: The wound continues to improve with use of the snap VAC. The swelling in her foot is better. The undermining continues to close in. There is a layer of slough overlying good granulation tissue. Electronic Signature(s) Signed: 10/30/2022 4:00:35 PM By: Fredirick Maudlin MD  FACS Entered By: Fredirick Maudlin on 10/30/2022 16:00:34 -------------------------------------------------------------------------------- Physical Exam Details Patient Name: Date of Service: [name] [name] 10/30/2022 3:15 PM Medical Record Number: [id number] Patient Account Number: [id number] Date of Birth/Sex: Treating RN: 01-11-1946 (75 y.o. F) Primary Care Provider: [name] Other Clinician: Referring Provider: Treating Provider/Extender: [name] in Treatment: [address] [place] ([id number]) [id number] Page 3 of 9 Constitutional . . . . No acute distress.Marland Kitchen Respiratory Normal work of breathing on room air.. Notes 10/30/2022: The wound continues to improve with use of the snap VAC. The swelling in her foot is better. The undermining continues to close in. There is a layer of slough overlying good granulation tissue. Electronic Signature(s) Signed: 10/30/2022 4:00:59 PM By: Fredirick Maudlin MD FACS Entered By: Fredirick Maudlin on 10/30/2022 16:00:59 -------------------------------------------------------------------------------- Physician Orders Details Patient Name: Date of Service: [name] [name] 10/30/2022 3:15 PM Medical Record Number: [id number] Patient Account Number: [id number] Date of Birth/Sex: Treating RN: 09-Aug-1946 (76 y.o. 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 - Dr. [name] Rm 4 Return A Bathing/ Shower/ Hygiene May shower with protection but do not get wound dressing(s) wet. Negative Presssure Wound Therapy SNAP Vac to wound continuously at 178m/hg pressure Blue Foam Edema Control - Lymphedema / SCD / Other Left Lower Extremity Elevate legs to the level of the heart or above for 30 minutes  daily and/or when sitting, a frequency of: Avoid standing for long periods of time. Patient to wear own compression stockings every day. Compression stocking or Garment 20-30 mm/Hg pressure to: - left leg Home Health dmit to Home Health for wound care. May utilize formulary equivalent dressing for wound treatment orders unless otherwise specified. - A For skilled nursing for wound care. New wound care orders this week; continue Home Health for wound care. May utilize formulary equivalent dressing for wound treatment orders unless otherwise specified. - Skilled nursing referral for wound care Wound Treatment Wound #1 - Foot Wound Laterality: Dorsal, Right Cleanser: Soap and Water 1 x Per Day/30 Days Discharge Instructions: May shower and wash wound with dial antibacterial soap and water prior to dressing change. Cleanser: Wound Cleanser 1 x Per Day/30 Days RONNETTA, CURRINGTON ([id number]) [phone].pdf Page 4 of 9 Discharge Instructions: Cleanse the wound with wound cleanser prior to applying a clean dressing using gauze sponges, not tissue or cotton balls. Topical: Skintegrity Hydrogel 4 (oz) 1 x Per Day/30 Days Discharge Instructions: Apply hydrogel as directed Prim Dressing: Promogran Prisma Matrix, 4.34 (sq in) (silver collagen) 1 x Per Day/30 Days ary Discharge Instructions: Moisten collagen with saline or hydrogel Secured With: Transpore Surgical Tape,  2x10 (in/yd) 1 x Per Day/30 Days Discharge Instructions: Secure dressing with tape as directed. Compression Wrap: Kerlix Roll 4.5x3.1 (in/yd) (Generic) 1 x Per Day/30 Days Discharge Instructions: Apply Kerlix and Coban compression as directed. Electronic Signature(s) Signed: 10/30/2022 4:03:30 PM By: Fredirick Maudlin MD FACS Entered By: Fredirick Maudlin on 10/30/2022 16:01:22 -------------------------------------------------------------------------------- Problem List Details Patient Name: Date of Service: [name] [name] 10/30/2022 3:15 PM Medical Record Number: [id number] Patient Account Number: [id number] Date of Birth/Sex: Treating RN: 10-Sep-1946 (76 y.o. 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 09/20/2022 No Yes without evidence of necrosis L97.812 Non-pressure chronic ulcer of other part of right lower leg with fat layer 09/20/2022 No Yes exposed F12.19 Chronic systolic (congestive) heart failure 09/20/2022 No Yes M06.9 Rheumatoid arthritis, unspecified 09/20/2022 No Yes Z79.52 Long term (current) use of systemic steroids 09/20/2022 No Yes I70.90 Unspecified atherosclerosis 09/20/2022 No Yes 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  10/30/2022 3:15 PM Medical Record Number: [id number] Patient Account Number: [id number] Date of Birth/Sex: Treating RN: 08/07/1946 (76 y.o. F) Primary Care Provider: [name] Other Clinician: Referring Provider: Treating Provider/Extender: [name] in Treatment: 5 Subjective Chief Complaint Information obtained from Patient Patient seen for complaints of Non-Healing Wounds. History of Present Illness (HPI) ADMISSION 09/20/2022 This is a 76 year old woman with a past medical history significant for congestive heart failure, atrial fibrillation, rheumatoid arthritis on long-term steroid treatment, CMML currently receiving chemotherapy and moderate malnutrition. She presents to clinic today with a wound on the dorsum of her right foot. She says it started out as a bruise and then developed a bump which subsequently broke open. She says she was sent to our clinic by her oncologist. She is not diabetic and does  not smoke. ABI in clinic today was 1.55. She has had formal segmental arterial Dopplers done, a little over a year ago, which did not show any evidence of occlusive vascular disease. On the dorsal aspect of the right foot, there is a wound with a lot of old hematoma, slough, eschar, and and nonviable subcutaneous tissue. T the best of my o ability to discern, it does appear to involve the muscle layer, but there is no evidence of necrosis. The wound is pouring serous fluid; the patient has 3+ pitting edema to the bilateral lower extremities. 09/29/2022: The patient came to clinic today with nothing but a T overlying her wound. She is accompanied by her husband today. She has not elfa communicated with her cardiologist regarding her fluid overload. She still has 3+ pitting edema bilaterally. The wound is still pouring serous fluid, although not quite as effusively as at her initial visit. There is still quite a bit of nonviable tissue and hematoma  present. 10/13; patient's wound on the right lateral lower leg his eschared over and may well be on its way to healing. The real problem here is on the right dorsal foot just proximal to her toes. The patient is still puzzled by the cause of this. Looking at a picture on the patient's smart phone there is a suggestion that this might have been a hematoma which seems to fit what Dr. [name] had felt on her initial evaluation. They have been using iodoform packing kerlix and Coban. 10/20; right lower leg is healed. We are approved for snap VAC on the right foot. We will try to arrange home health but until then she is going to need a nurse visit on Tuesday 10/20/2022: It has been 2 weeks since I have seen the wound. It has improved significantly since then. She is currently in a snap VAC and I think this has made quite a difference. The overall wound measurements are smaller. There is no massive drainage, as there had been previously. There is some good granulation tissue forming underneath a bit of slough. 10/30/2022: The wound continues to improve with use of the snap VAC. The swelling in her foot is better. The undermining continues to close in. There is a layer of slough overlying good granulation tissue. Patient History Information obtained from Patient. Family History Unknown History, Diabetes - Father, Heart Disease - Father, No family history of Cancer. Social History Former smoker - quit 2003, Marital Status - Married, Alcohol Use - Daily, Drug Use - Prior History, Caffeine Use - Daily. Medical History Eyes Denies history of Cataracts, Glaucoma, Optic Neuritis Ear/Nose/Mouth/Throat Denies history of Chronic sinus problems/congestion, Middle ear problems Respiratory Patient has history of Asthma, Chronic Obstructive Pulmonary Disease (COPD) Cardiovascular Patient has history of Congestive Heart Failure, Hypertension Denies history of Coronary Artery Disease, Deep Vein Thrombosis,  Hypotension, Myocardial Infarction Endocrine Denies history of Type I Diabetes, Type II Diabetes Immunological GARRY, BOCHICCHIO ([id number]) [phone].pdf Page 6 of 9 Denies history of Lupus Erythematosus, Raynaudoos, Scleroderma Integumentary (Skin) Denies history of History of Burn Musculoskeletal Patient has history of Rheumatoid Arthritis Denies history of Gout Neurologic Patient has history of Neuropathy Oncologic Patient has history of Received Chemotherapy - currently receiving Hospitalization/Surgery History - reverse shoulder arthroplasty Rt- 2018. Medical A Surgical History Notes nd Cardiovascular AFIB Genitourinary Stage 3 CKD Objective Constitutional No acute distress.. Vitals Time Taken: 3:15 AM, Height: 64 in, Weight: 111 lbs, BMI: 19.1, Temperature: 98.0 F, Pulse: 71 bpm, Respiratory Rate: 20 breaths/min, Blood  Pressure: 123/79 mmHg. Respiratory Normal work of breathing on room air.. General Notes: 10/30/2022: The wound continues to improve with use of the snap VAC. The swelling in her foot is better. The undermining continues to close in. There is a layer of slough overlying good granulation tissue. Integumentary (Hair, Skin) Wound #1 status is Open. Original cause of wound was Blister. The date acquired was: 08/23/2022. The wound has been in treatment 5 weeks. The wound is located on the Right,Dorsal Foot. The wound measures 1.2cm length x 1cm width x 0.3cm depth; 0.942cm^2 area and 0.283cm^3 volume. There is Fat Layer (Subcutaneous Tissue) exposed. There is a medium amount of serosanguineous drainage noted. The wound margin is distinct with the outline attached to the wound base. There is medium (34-66%) red granulation within the wound bed. There is a medium (34-66%) amount of necrotic tissue within the wound bed including Adherent Slough. The periwound skin appearance had no abnormalities noted for texture. The periwound skin  appearance had no abnormalities noted for moisture. The periwound skin appearance exhibited: Erythema. The surrounding wound skin color is noted with erythema. Periwound temperature was noted as No Abnormality. 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 There was a Selective/Open Wound Non-Viable Tissue Debridement with a total area of 1.2 sq cm performed by Fredirick Maudlin, MD. With the following instrument(s): Curette to remove Non-Viable tissue/material. Material removed includes [facility] after achieving pain control using Lidocaine 5% topical ointment. A time out was conducted at 15:45, prior to the start of the procedure. A Minimum amount of bleeding was controlled with Pressure. The procedure was tolerated well with a pain level of 0 throughout and a pain level of 0 following the procedure. Post Debridement Measurements: 1.2cm length x 1cm width x 0.3cm depth; 0.283cm^3 volume. Character of Wound/Ulcer Post Debridement requires further debridement. Post procedure Diagnosis Wound #1: Same as Pre-Procedure General Notes: Scribed for Dr. [name] by Blanche East, RN. JENNETTE, LEASK ([id number]) [id number] Page 7 of 9 Plan Follow-up Appointments: Return Appointment in 1 week. - Dr. [name] Rm 4 Bathing/ Shower/ Hygiene: May shower with protection but do not get wound dressing(s) wet. Negative Presssure Wound Therapy: SNAP Vac to wound continuously at 127m/hg  pressure Blue Foam Edema Control - Lymphedema / SCD / Other: Elevate legs to the level of the heart or above for 30 minutes daily and/or when sitting, a frequency of: Avoid standing for long periods of time. Patient to wear own compression stockings every day. Compression stocking or Garment 20-30 mm/Hg pressure to: - left leg Home Health: [facility]. May utilize formulary equivalent dressing for wound treatment orders unless otherwise specified. - For skilled nursing for wound care. New wound care orders this week; continue Home Health for wound care. May utilize formulary equivalent dressing for wound treatment orders unless otherwise specified. - Skilled nursing referral for wound care WOUND #1: - Foot Wound Laterality: Dorsal, Right Cleanser: Soap and Water 1 x Per Day/30 Days Discharge Instructions: May shower and wash wound with dial antibacterial soap and water prior to dressing change. Cleanser: Wound Cleanser 1  x Per Day/30 Days Discharge Instructions: Cleanse the wound with wound cleanser prior to applying a clean dressing using gauze sponges, not tissue or cotton balls. Topical: Skintegrity Hydrogel 4 (oz) 1 x Per Day/30 Days Discharge Instructions: Apply hydrogel as directed Prim Dressing: Promogran Prisma Matrix, 4.34 (sq in) (silver collagen) 1 x Per Day/30 Days ary Discharge Instructions: Moisten collagen with saline or hydrogel Secured With: Transpore Surgical T ape, 2x10 (in/yd) 1 x Per Day/30 Days Discharge Instructions: Secure dressing with tape as directed. Com pression Wrap: Kerlix Roll 4.5x3.1 (in/yd) (Generic) 1 x Per Day/30 Days Discharge Instructions: Apply Kerlix and Coban compression as directed. 10/30/2022: The wound continues to improve with use of the snap VAC. The swelling in her foot is better. The undermining continues to close in. There is a layer of slough overlying good granulation tissue. I used a curette to debride the slough  from her wound. We will continue to use the snap VAC. Follow-up in 1 week. Electronic Signature(s) Signed: 10/30/2022 4:02:13 PM By: Fredirick Maudlin MD FACS Entered By: Fredirick Maudlin on 10/30/2022 16:02:12 -------------------------------------------------------------------------------- HxROS Details Patient Name: Date of Service: [name] [name] 10/30/2022 3:15 PM Medical Record Number: [id number] Patient Account Number: [id number] Date of Birth/Sex: Treating RN: 1946-05-22 (76 y.o. F) Primary Care Provider: [name] Other Clinician: Referring Provider: Treating Provider/Extender: [name] in Treatment: 5 Information Obtained From Patient Eyes Medical History: Negative for: Cataracts; Glaucoma; Optic Neuritis Ear/Nose/Mouth/Throat Medical History: Negative for: Chronic sinus problems/congestion; Middle ear problems Respiratory Analyse, Angst Aline H ([id number]) [phone].pdf Page 8 of 9 Medical History: Positive for: Asthma; Chronic Obstructive Pulmonary Disease (COPD) Cardiovascular Medical History: Positive for: Congestive Heart Failure; Hypertension Negative for: Coronary Artery Disease; Deep Vein Thrombosis; Hypotension; Myocardial Infarction Past Medical History Notes: AFIB Endocrine Medical History: Negative for: Type I Diabetes; Type II Diabetes Genitourinary Medical History: Past Medical History Notes: Stage 3 CKD Immunological Medical History: Negative for: Lupus Erythematosus; Raynauds; Scleroderma Integumentary (Skin) Medical History: Negative for: History of Burn Musculoskeletal Medical History: Positive for: Rheumatoid Arthritis Negative for: Gout Neurologic Medical History: Positive for: Neuropathy Oncologic Medical History: Positive for: Received Chemotherapy - currently receiving Immunizations Pneumococcal Vaccine: Received Pneumococcal Vaccination: Yes Received Pneumococcal  Vaccination On or After 60th Birthday: Yes Implantable Devices None Hospitalization / Surgery History Type of Hospitalization/Surgery reverse shoulder arthroplasty Rt- 2018 Family and Social History Unknown History: Yes; Cancer: No; Diabetes: Yes - Father; Heart Disease: Yes - Father; Former smoker - quit 2003; Marital Status - Married; Alcohol Use: Daily; Drug Use: Prior History; Caffeine Use: Daily; Financial Concerns: No; Food, Clothing or Shelter Needs: No; Support System Lacking: No; Transportation Concerns: No Electronic Signature(s) Signed: 10/30/2022 4:03:30 PM By: Fredirick Maudlin MD FACS Entered By: Fredirick Maudlin on 10/30/2022 16:00:39 Verlee Monte, [name] ([id number]) [id number] Page 9 of 9 -------------------------------------------------------------------------------- SuperBill Details Patient Name: Date of Service: TESSA, SEABERRY 10/30/2022 Medical Record Number: [id number] Patient Account Number: [id number] Date of Birth/Sex: Treating RN: 1946-10-20 (76 y.o. 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 Facility Procedures : CPT4 Code: 16742552 Description: 58948 - DEBRIDE WOUND 1ST 20 SQ CM OR < ICD-10 Diagnosis Description 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 wit Modifier:  hout evidence of n Quantity: 1 ecrosis : CPT4 Code: 34758307 Description: 46002 - WOUND VAC-50 SQ CM OR LESS Modifier: Quantity: 1 Physician Procedures : CPT4 Code Description Modifier [id number] - WC PHYS LEVEL 4 - EST PT 25 ICD-10 Diagnosis Description 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

## 2022-10-30 NOTE — Progress Notes (Signed)
[facility]   [phone] [phone]   Clinic Follow up Note   Patient Care Team: [name], MD as PCP - General (Family Medicine) [name], Wonda Cheng, MD as PCP - Cardiology (Cardiology) Gavin Pound, MD as Consulting Physician (Rheumatology) Truitt Merle, MD as Consulting Physician (Hematology) Juanita Craver, MD as Consulting Physician (Gastroenterology) Garner Nash, DO as Consulting Physician (Pulmonary Disease)  Date of Service:  10/30/2022  CHIEF COMPLAINT: f/u of CMML  CURRENT THERAPY:  Azacitadine, days 1-5 q6weeks, starting 02/06/22   ASSESSMENT & PLAN:  [name] is a 76 y.o. female with   1. 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 SPEP and UPEP with immunofixation were negative. No lab evidence of hemolysis, erythropoietin level is normal.  -Her prior bone marrow biopsy in 2016 was negative except several small lymphoid aggregation, suspicious for low-grade B cell lymphoproliferative process, especially SLL. Her peripheral white count has been normal, no elevated lymphocytes -CT scan from 03/2015 was negative for adenopathy or splenomegaly. no B symptoms  --She developed worsening back pain, pelvic MRI 07/04/21 showed a mass in the presacral space. 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 Her BM biopsy was reviewed at [facility] and was felt to be CMML-1 with 5% blasts.  -She began azacitadine on 02/06/22.  -She was hospitalized 03/29/22 - 04/13/22 for A.fib with RVR. Repeat bone marrow biopsy during hospitalization showed persistent CMML with only 2% blasts, slightly lower than before. Cytology from thoracentesis was negative. 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 -she is currently receiving azacitadine every 6 weeks following a fall in early 08/2022 and increased weakness. -labs reviewed, improved with increased time between treatments-- Hgb 10.8, platelet 122K, otherwise stable.    2. Symptom Management: Rash, right foot wound -she reports a new rash to her groin area and thighs. She was prescribed a cream and referred to dermatology by her PCP. -foot wound being managed by wound care center.     PLAN: -proceed with azacitadine today and daily this week  -nutrition to see her 11/9 -lab/flush in 2 and 4 weeks to see if she needs blood transfusion  -lab, flush, f/u, and azacitadine in 6 weeks   No problem-specific Assessment & Plan notes found for this encounter.   SUMMARY OF ONCOLOGIC HISTORY: Oncology History  CMML (chronic myelomonocytic leukemia) ([place])  11/09/2021 Initial Biopsy   DIAGNOSIS:   -  Monoclonal B-cell population with co-expression of CD5 comprises 17%  of all lymphocytes  -  See comment   COMMENT:  In addition to the clonal B-cell population, there is a myeloblast  population (CD34, CD38, HLA-DR, CD117, CD123 and CD33) that comprises 2% of the total cellular events.  Please see concurrent tissue biopsy (below) for additional work-up and final diagnosis.    FINAL MICROSCOPIC DIAGNOSIS:   A. SOFT TISSUE MASS, PRE SACRAL, NEEDLE CORE BIOPSY:  -  Chronic lymphocytic leukemia/small lymphocytic lymphoma  -  Extra medullary hematopoiesis  -  See comment   COMMENT:  The biopsy consists of multiple soft tissue cores with lymphoid nodules and a dense hematopoietic infiltrate consistent with extra medullary hematopoiesis.  MPO and E-cadherin highlight myeloid and erythroid precursors respectively.  CD34 highlights increased vasculature and  is also positive within the cytoplasm of megakaryocytes.  A few small, immature mononuclear cells appear to be positive for  CD34 and CD117. TdT shows rare, scattered positive cells.  CD20 highlights aggregates of B cells which are admixed with CD3 positive T cells.  T cells are an admixture of CD4 and CD8.  The B cells are also positive for CD5, CD23 and Bcl-2.  The B cells do not show significant staining for CD10, BCL6 or cyclin D1.  CD138 highlights scattered plasma cells which are polytypic by kappa and lambda in situ hybridization.  Flow cytometry performed on the sample (see WL [id number]) identified a kappa restricted CD5 positive B-cell population comprising 70% of lymphocytes.  In addition, a small myeloblast population comprised 2% of the total cellular events.   Overall, the findings are consistent with soft tissue involvement by  chronic lymphocytic leukemia/small lymphocytic lymphoma and extra medullary hematopoiesis. In reviewing the patient's CBC data (macrocytic anemia and thrombocytopenia), I would recommend a bone marrow biopsy to assess for marrow involvement by CLL/SLL.    12/23/2021 Imaging   EXAM: CT CHEST, ABDOMEN, AND PELVIS WITH CONTRAST  IMPRESSION: 1. Slight interval enlargement of a presacral soft tissue mass measuring 7.2 x 4.7 cm, previously 6.9 x 4.1 cm on prior MR of the pelvis dated 07/04/2021. By report, this represents a biopsy proven lymphoma. 2. Pleural nodule of the dependent right lower lobe overlying the posterior right tenth rib and pleural or paraspinous soft tissue mass overlying the right aspect of the T10 vertebral body, very slightly enlarged compared to prior examination of the chest dated 06/25/2020, consistent with additional sites of lymphomatous involvement given very indolent growth. These could be better assessed for metabolic activity by FDG PET/CT if desired. 3. There is mild, bibasilar predominant pulmonary fibrosis in a pattern featuring irregular peripheral interstitial opacity, septal thickening, but without clear evidence of subpleural bronchiolectasis or  honeycombing, with a somewhat asymmetric distribution most conspicuously involving the right lower lobe and lingula. These findings are significantly worsened when compared to prior examination dated 06/25/2020, particularly in the right lower lobe. Given interval change, this may reflect sequelae of interval infection or aspiration, however appearance is generally suspicious for fibrotic interstitial lung disease, and if characterized by ATS pulmonary fibrosis is in an "indeterminate for UIP" pattern, differential considerations including both UIP and NSIP. 4. Emphysema.   Aortic Atherosclerosis (ICD10-I70.0) and Emphysema (ICD10-J43.9).   01/05/2022 Pathology Results   DIAGNOSIS:   BONE MARROW, ASPIRATE, CLOT, CORE:  -Hypercellular bone marrow for age with features of  myelodysplastic/myeloproliferative neoplasm  -Minor abnormal B-cell population  -See comment   PERIPHERAL BLOOD:  -Macrocytic anemia  -Neutrophilic left shift and monocytosis  -Thrombocytopenia   COMMENT:  The bone marrow is hypercellular for age with dyspoietic changes  involving myeloid cell lines associated with monocytosis and increased number of blastic cells (12%) as primarily seen by morphology, many of which display monocytic features.  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  In this background, there are several predominantly small lymphoid aggregates mostly composed of small lymphoid cells.  By flow cytometry, a minor abnormal B-cell population expressing CD5 is seen and representing 2% of all cells.  This correlate with previously known B-cell lymphoproliferative process.  Correlation with cytogenetic and FISH studies is strongly recommended.    DIAGNOSIS:   -Increased number of monocytic cells present (25%)  -Minor abnormal B-cell  population identified.  -See comment   COMMENT:  Flow cytometric analysis shows increased number of monocytic cells representing 25% of all cells but without aberrant phenotype or CD34 expression.  A significant CD34-positive blastic population is not identified.  The lymphoid population shows a minor B-cell population representing 2% of all cells and expressing B-cell antigens including CD20 associated with CD5, CD200 and possibly dim kappa expression.  The latter findings are abnormal and correlate with previously known B-cell lymphoproliferative process.  No significant T-cell phenotypic abnormalities identified.    01/12/2022 Initial Diagnosis   CMML (chronic myelomonocytic leukemia) ([place])   01/12/2022 Cancer Staging   Staging form: Chronic Myeloid Leukemia, AJCC 8th Edition - Clinical stage from 01/12/2022: Bone marrow blast count (%): 12, Additional clonal changes: Unknown - Signed by Truitt Merle, MD on 01/12/2022 Stage prefix: Initial diagnosis   02/06/2022 - 08/11/2022 Chemotherapy   Patient is on Treatment Plan : MYELODYSPLASIA  Azacitidine IV D1-7 q28d     02/06/2022 -  Chemotherapy   Patient is on Treatment Plan : MYELODYSPLASIA  Azacitidine IV D1-7 q28d        INTERVAL HISTORY:  [name] is here for a follow up of CMML. She was last seen by NP Mendel Ryder on 10/05/22. She presents to the clinic accompanied by her husband. She reports she has developed a rash to her groin. She explains she was prescribed a cream and referred to dermatology   All other systems were reviewed with the patient and are negative.  MEDICAL HISTORY:  Past Medical History:  Diagnosis Date   Arthritis    Rheumatoid arthritis   Celiac disease    Chronic kidney disease    stage 3 from MD notes   COPD (chronic obstructive pulmonary disease) ([place])    Dyspnea    with going up stairs   Family history of adverse reaction to anesthesia    father had hard time waking up   Headache    sinus headaches    Hot flashes    Hypertension    Iron deficiency anemia    Pneumonia    per patient "I have walking pneumonia"    SURGICAL HISTORY: Past Surgical History:  Procedure Laterality Date   COLONOSCOPY     ECTOPIC PREGNANCY SURGERY      x 2   IR IMAGING GUIDED PORT INSERTION  01/31/2022   REVERSE SHOULDER ARTHROPLASTY Right 02/02/2017   Procedure: RIGHT REVERSE SHOULDER ARTHROPLASTY;  Surgeon: Netta Cedars, MD;  Location: [facility];  Service: Orthopedics;  Laterality: Right;    I have reviewed the social history and family history with the patient and they are unchanged from previous note.  ALLERGIES:  is allergic to digoxin and related, gluten meal, penicillins, alendronate, and hydroxychloroquine.  MEDICATIONS:  Current Outpatient Medications  Medication Sig Dispense Refill   denosumab (PROLIA) 60 MG/ML SOSY injection Inject 60 mg into the skin every 6 (six) months.     diltiazem (CARDIZEM CD) 240 MG 24 hr capsule Take 1 capsule (240 mg total) by mouth daily. 30 capsule 6   fluconazole (DIFLUCAN) 150 MG tablet Take 150 mg by mouth daily.     furosemide (LASIX) 20 MG tablet Take 1 tablet (20 mg total) by mouth daily. 30 tablet 11   gabapentin (NEURONTIN) 300 MG capsule Take 300 mg by mouth in the morning.     InFLIXimab (REMICADE IV) Inject 10 mg into the vein every 2 (two) months.     Metoprolol Tartrate 75 MG TABS  Take 150 mg by mouth 2 (two) times daily. 360 tablet 3   mirtazapine (REMERON) 7.5 MG tablet Take 1 tablet (7.5 mg total) by mouth at bedtime. 30 tablet 0   nystatin cream (MYCOSTATIN) Apply topically 2 (two) times daily.     potassium chloride SA (KLOR-CON M20) 20 MEQ tablet Take 1 tablet (20 mEq total) by mouth daily. Take with furosemide 90 tablet 3   predniSONE (DELTASONE) 5 MG tablet Take 5 mg by mouth daily with breakfast.     Tiotropium Bromide-Olodaterol (STIOLTO RESPIMAT) 2.5-2.5 MCG/ACT AERS Inhale 2 puffs into the lungs daily. 4 g 5   No current facility-administered  medications for this visit.   Facility-Administered Medications Ordered in Other Visits  Medication Dose Route Frequency Provider Last Rate Last Admin   acetaminophen (TYLENOL) 325 MG tablet            diphenhydrAMINE (BENADRYL) 25 mg capsule             PHYSICAL EXAMINATION: ECOG PERFORMANCE STATUS: 3 - Symptomatic, >50% confined to bed  Vitals:   10/30/22 1137  BP: 107/62  Pulse: 69  Resp: 18  Temp: 97.7 F (36.5 C)  SpO2: 96%   Wt Readings from Last 3 Encounters:  10/30/22 107 lb 9.6 oz (48.8 kg)  10/25/22 104 lb (47.2 kg)  10/05/22 103 lb 14.4 oz (47.1 kg)     GENERAL:alert, no distress and comfortable SKIN: skin color normal, very dry skin, (+) rash to bilateral groin, low buttock, and thighs, (+) wound to right foot, covered EYES: normal, Conjunctiva are pink and non-injected, sclera clear  NEURO: alert & oriented x 3 with fluent speech  LABORATORY DATA:  I have reviewed the data as listed    Latest Ref Rng & Units 10/30/2022   11:05 AM 10/05/2022   11:44 AM 09/20/2022    1:59 PM  CBC  WBC 4.0 - 10.5 K/uL 11.0  4.6  8.5   Hemoglobin 12.0 - 15.0 g/dL 10.8  9.5  9.1   Hematocrit 36.0 - 46.0 % 32.7  29.6  28.6   Platelets 150 - 400 K/uL 122  41  123         Latest Ref Rng & Units 10/30/2022   11:05 AM 10/05/2022   11:44 AM 09/18/2022   12:17 PM  CMP  Glucose 70 - 99 mg/dL 128  128  189   BUN 8 - 23 mg/dL 17  20  32   Creatinine 0.44 - 1.00 mg/dL 0.87  0.94  1.15   Sodium 135 - 145 mmol/L 134  137  135   Potassium 3.5 - 5.1 mmol/L 5.1  4.3  4.3   Chloride 98 - 111 mmol/L 100  101  101   CO2 22 - 32 mmol/L _0 Calcium 8.9 - 10.3 mg/dL 9.1  9.0  8.8   Total Protein 6.5 - 8.1 g/dL  6.0    Total Bilirubin 0.3 - 1.2 mg/dL  0.9    Alkaline Phos 38 - 126 U/L  49    AST 15 - 41 U/L  21    ALT 0 - 44 U/L  20        RADIOGRAPHIC STUDIES: I have personally reviewed the radiological images as listed and agreed with the findings in the report. No  results found.    Orders Placed This Encounter  Procedures   CBC with Differential ([place] Only)    Standing Status:  Future    Standing Expiration Date:   16/38/4665   Basic Metabolic Panel - [place] Only    Standing Status:   Future    Standing Expiration Date:   12/12/2023   All questions were answered. The patient knows to call the clinic with any problems, questions or concerns. No barriers to learning was detected. The total time spent in the appointment was 30 minutes.     Truitt Merle, MD 10/30/2022   I, Wilburn Mylar, am acting as scribe for Truitt Merle, MD.   I have reviewed the above documentation for accuracy and completeness, and I agree with the above.

## 2022-10-30 NOTE — Progress Notes (Signed)
[name] ([id number]) [id number] Page 1 of 8 Visit Report for 10/30/2022 Arrival Information Details Patient Name: Date of Service: [name], [name] 10/30/2022 3:15 PM Medical Record Number: [id number] Patient Account Number: [id number] Date of Birth/Sex: Treating RN: Aug 05, 1946 (76 y.o. F) Primary Care [name]: [name] Other Clinician: Referring [name]: Treating [name]/Extender: [name] in Treatment: 5 Visit Information History Since Last Visit All ordered tests and consults were completed: No Patient Arrived: Wheel Chair Added or deleted any medications: No Arrival Time: 15:25 Any new allergies or adverse reactions: No Transfer Assistance: None Had a fall or experienced change in No Patient Identification Verified: Yes activities of daily living that may affect Secondary Verification Process Completed: Yes risk of falls: Patient Requires Transmission-Based Precautions: No Signs or symptoms of abuse/neglect since last visito No Patient Has Alerts: No Hospitalized since last visit: No Implantable device outside of the clinic excluding No cellular tissue based products placed in the center since last visit: Pain Present Now: No Electronic Signature(s) Signed: 10/30/2022 4:10:49 PM By: [name] Entered By: [name] on 10/30/2022 15:26:15 -------------------------------------------------------------------------------- Encounter Discharge Information Details Patient Name: Date of Service: [name] RO N [name]. 10/30/2022 3:15 PM Medical Record Number: [id number] Patient Account Number: [id number] Date of Birth/Sex: Treating RN: 09-Dec-1946 (76 y.o. [name] Primary Care Raelee Rossmann: [name] Other Clinician: Referring [name]: Treating [name]/Extender: [name] in Treatment: 5 Encounter Discharge Information Items Post Procedure Vitals Discharge  Condition: Stable Temperature (F): 98.0 Ambulatory Status: Wheelchair Pulse (bpm): 71 Discharge Destination: Home Respiratory Rate (breaths/min): 20 Transportation: Private Auto Blood Pressure (mmHg): 123/79 Accompanied By: husband Schedule Follow-up Appointment: Yes Clinical Summary of Care: Electronic Signature(s) Signed: 10/30/2022 4:40:13 PM By: Sherry East RN Entered By: [name] on 10/30/2022 15:50:21 [name], [name] ([id number]) [id number] Page 2 of 8 -------------------------------------------------------------------------------- Lower Extremity Assessment Details Patient Name: Date of Service: [name] 10/30/2022 3:15 PM Medical Record Number: [id number] Patient Account Number: [id number] Date of Birth/Sex: Treating RN: Nov 23, 1946 (76 y.o. [name] Primary Care Naveya Ellerman: [name] Other Clinician: Referring Adriano Bischof: Treating [name]/Extender: [name] in Treatment: 5 Edema Assessment Assessed: [Left: No] [Right: No] [Left: Edema] [Right: :] Calf Left: Right: Point of Measurement: From Medial Instep 28.5 cm Ankle Left: Right: Point of Measurement: From Medial Instep 20 cm Vascular Assessment Pulses: Dorsalis Pedis Palpable: [Right:Yes] Electronic Signature(s) Signed: 10/30/2022 4:40:13 PM By: Sherry East RN Entered By: [name] on 10/30/2022 15:42:53 -------------------------------------------------------------------------------- Multi Wound Chart Details Patient Name: Date of Service: [name] RO N [name]. 10/30/2022 3:15 PM Medical Record Number: [id number] Patient Account Number: [id number] Date of Birth/Sex: Treating RN: [birth year] (76 y.o. F) Primary Care Rozella Servello: [name] Other Clinician: Referring Marlisa Caridi: Treating Amarie Tarte/Extender: [name] in Treatment: 5 Vital Signs Height(in): 64 Pulse(bpm): 20 Weight(lbs):  111 Blood Pressure(mmHg): 123/79 Body Mass Index(BMI): 19.1 Temperature(F): 98.0 Respiratory Rate(breaths/min): 20 [1:Photos:] [N/A:N/A] Right, Dorsal Foot N/A N/A Wound Location: Blister N/A N/A Wounding Event: Lymphedema N/A N/A Primary Etiology: Asthma, Chronic Obstructive N/A N/A Comorbid History: Pulmonary Disease (COPD), Congestive Heart Failure, Hypertension, Rheumatoid Arthritis, Neuropathy, Received Chemotherapy 08/23/2022 N/A N/A Date Acquired: 5 N/A N/A Weeks of Treatment: Open N/A N/A Wound Status: No N/A N/A Wound Recurrence: 1.2x1x0.3 N/A N/A Measurements L x W x D (cm) 0.942 N/A N/A A (cm) : rea 0.283 N/A N/A Volume (cm) : 9.20% N/A N/A % Reduction in A rea: 54.50% N/A N/A %  Reduction in Volume: Full Thickness Without Exposed N/A N/A Classification: Support Structures Medium N/A N/A Exudate A mount: Serosanguineous N/A N/A Exudate Type: red, brown N/A N/A Exudate Color: Distinct, outline attached N/A N/A Wound Margin: Medium (34-66%) N/A N/A Granulation A mount: Red N/A N/A Granulation Quality: Medium (34-66%) N/A N/A Necrotic A mount: Fat Layer (Subcutaneous Tissue): Yes N/A N/A Exposed Structures: Fascia: No Tendon: No Muscle: No Joint: No Bone: No None N/A N/A Epithelialization: Debridement - Selective/Open Wound N/A N/A Debridement: Pre-procedure Verification/Time Out 15:45 N/A N/A Taken: Lidocaine 5% topical ointment N/A N/A Pain Control: Slough N/A N/A Tissue Debrided: Non-Viable Tissue N/A N/A Level: 1.2 N/A N/A Debridement A (sq cm): rea Curette N/A N/A Instrument: Minimum N/A N/A Bleeding: Pressure N/A N/A Hemostasis A chieved: 0 N/A N/A Procedural Pain: 0 N/A N/A Post Procedural Pain: Procedure was tolerated well N/A N/A Debridement Treatment Response: 1.2x1x0.3 N/A N/A Post Debridement Measurements L x W x D (cm) 0.283 N/A N/A Post Debridement Volume: (cm) No Abnormalities Noted N/A  N/A Periwound Skin Texture: No Abnormalities Noted N/A N/A Periwound Skin Moisture: Erythema: Yes N/A N/A Periwound Skin Color: No Abnormality N/A N/A Temperature: Debridement N/A N/A Procedures Performed: Treatment Notes Wound #1 (Foot) Wound Laterality: Dorsal, Right Cleanser Soap and Water Discharge Instruction: May shower and wash wound with dial antibacterial soap and water prior to dressing change. Wound Cleanser Discharge Instruction: Cleanse the wound with wound cleanser prior to applying a clean dressing using gauze sponges, not tissue or cotton balls. Peri-Wound Care Topical Skintegrity Hydrogel 4 (oz) Discharge Instruction: Apply hydrogel as directed Primary Dressing Promogran Prisma Matrix, 4.34 (sq in) (silver collagen) Discharge Instruction: Moisten collagen with saline or hydrogel Secondary Dressing Secured With Transpore Surgical Tape, 2x10 (in/yd) Discharge Instruction: Secure dressing with tape as directed. [name], [name] ([id number]) [id number] Page 4 of 8 Compression Wrap Kerlix Roll 4.5x3.1 (in/yd) Discharge Instruction: Apply Kerlix and Coban compression as directed. Compression Stockings Add-Ons Electronic Signature(s) Signed: 10/30/2022 3:59:42 PM By: Fredirick Maudlin MD FACS Entered By: Fredirick Maudlin on 10/30/2022 15:59:42 -------------------------------------------------------------------------------- Multi-Disciplinary Care Plan Details Patient Name: Date of Service: BRIAH, NARY RO N [name]. 10/30/2022 3:15 PM Medical Record Number: [id number] Patient Account Number: [id number] Date of Birth/Sex: Treating RN: Dec 25, 1946 (76 y.o. [name] Primary Care Oryan Winterton: [name] Other Clinician: Referring Female Iafrate: Treating Jatavious Peppard/Extender: [name] in Treatment: 5 Active Inactive Peripheral Neuropathy Nursing Diagnoses: Potential alteration in peripheral tissue perfusion  (select prior to confirmation of diagnosis) Goals: Patient/caregiver will verbalize understanding of disease process and disease management Date Initiated: 09/20/2022 Target Resolution Date: 11/24/2022 Goal Status: Active Interventions: Provide education on Management of Neuropathy and Related Ulcers Treatment Activities: Patient referred for customized footwear/offloading : 09/20/2022 Notes: Wound/Skin Impairment Nursing Diagnoses: Knowledge deficit related to ulceration/compromised skin integrity Goals: Ulcer/skin breakdown will have a volume reduction of 30% by week 4 Date Initiated: 09/20/2022 Target Resolution Date: 11/24/2022 Goal Status: Active Interventions: Assess ulceration(s) every visit Treatment Activities: Skin care regimen initiated : 09/20/2022 Topical wound management initiated : 09/20/2022 Notes: Electronic Signature(s) Signed: 10/30/2022 4:40:13 PM By: Sherry East RN Entered By: [name] on 10/30/2022 15:47:50 [name], [name] ([id number]) [id number] Page 5 of 8 -------------------------------------------------------------------------------- Negative Pressure Wound Therapy Maintenance (NPWT) Details Patient Name: Date of Service: [name], [name] 10/30/2022 3:15 PM Medical Record Number: [id number] Patient Account Number: [id number] Date of Birth/Sex: Treating RN: 1946-09-25 (76 y.o. [name] Primary Care Dane Bloch: [name] Other Clinician: Referring Meya Clutter: Treating Antavia Tandy/Extender: [name]  Derrek Monaco, Margaretha Sheffield Weeks in Treatment: 5 NPWT Maintenance Performed for: Wound #1 Right, Dorsal Foot Performed By: Sherry East, RN Type: VAC System Coverage Size (sq cm): 1.2 Pressure Type: Constant Pressure Setting: 125 mmHG Drain Type: Channel Primary Contact: Other : Prisma Sponge/Dressing Type: Foam, Blue Date Initiated: 10/13/2022 Dressing Removed: No Quantity of Sponges/Gauze Removed: 1 Canister  Changed: Yes Canister Exudate Volume: 15 Dressing Reapplied: No Quantity of Sponges/Gauze Inserted: 1 Respones T Treatment: o tolerated well Days On NPWT : 18 Post Procedure Diagnosis Same as Pre-procedure Electronic Signature(s) Signed: 10/30/2022 4:40:13 PM By: Sherry East RN Entered By: [name] on 10/30/2022 16:00:36 -------------------------------------------------------------------------------- Pain Assessment Details Patient Name: Date of Service: [name], Sherry RO N [name]. 10/30/2022 3:15 PM Medical Record Number: [id number] Patient Account Number: [id number] Date of Birth/Sex: Treating RN: 1946/07/29 (76 y.o. F) Primary Care Exodus Kutzer: [name] Other Clinician: Referring Abygail Galeno: Treating Anwen Cannedy/Extender: [name] in Treatment: 5 Active Problems Location of Pain Severity and Description of Pain Patient Has Paino No Site Locations [place], [place] ([id number]) [id number] Page 6 of 8 Pain Management and Medication Current Pain Management: Electronic Signature(s) Signed: 10/30/2022 4:10:49 PM By: [name] Entered By: [name] on 10/30/2022 15:26:54 -------------------------------------------------------------------------------- Patient/Caregiver Education Details Patient Name: Date of Service: [name] 11/6/2023andnbsp3:15 PM Medical Record Number: [id number] Patient Account Number: [id number] Date of Birth/Gender: Treating RN: 15-Sep-1946 (76 y.o. [name] Primary Care Physician: [name] Other Clinician: Referring Physician: Treating Physician/Extender: [name] in Treatment: 5 Education Assessment Education Provided To: Patient Education Topics Provided Peripheral Neuropathy: Methods: Explain/Verbal Responses: Reinforcements needed, State content correctly Electronic Signature(s) Signed: 10/30/2022 4:40:13 PM By: Sherry East  RN Entered By: [name] on 10/30/2022 15:48:03 -------------------------------------------------------------------------------- Wound Assessment Details Patient Name: Date of Service: [name], Sherry RO N [name]. 10/30/2022 3:15 PM Medical Record Number: [id number] Patient Account Number: [id number] Date of Birth/Sex: Treating RN: 1946/05/22 (76 y.o. F) Primary Care Colston Pyle: [name] Other Clinician: Referring Caley Ciaramitaro: Treating Damier Disano/Extender: Veneta Penton [place], [place] ([id number]) [phone].pdf Page 7 of 8 Weeks in Treatment: 5 Wound Status Wound Number: 1 Primary Lymphedema Etiology: Wound Location: Right, Dorsal Foot Wound Open Wounding Event: Blister Status: Date Acquired: 08/23/2022 Comorbid Asthma, Chronic Obstructive Pulmonary Disease (COPD), Weeks Of Treatment: 5 History: Congestive Heart Failure, Hypertension, Rheumatoid Arthritis, Clustered Wound: No Neuropathy, Received Chemotherapy Photos Wound Measurements Length: (cm) 1.2 Width: (cm) 1 Depth: (cm) 0.3 Area: (cm) 0.942 Volume: (cm) 0.283 % Reduction in Area: 9.2% % Reduction in Volume: 54.5% Epithelialization: None Wound Description Classification: Full Thickness Without Exposed Support Structures Wound Margin: Distinct, outline attached Exudate Amount: Medium Exudate Type: Serosanguineous Exudate Color: red, brown Foul Odor After Cleansing: No Slough/Fibrino Yes Wound Bed Granulation Amount: Medium (34-66%) Exposed Structure Granulation Quality: Red Fascia Exposed: No Necrotic Amount: Medium (34-66%) Fat Layer (Subcutaneous Tissue) Exposed: Yes Necrotic Quality: Adherent Slough Tendon Exposed: No Muscle Exposed: No Joint Exposed: No Bone Exposed: No Periwound Skin Texture Texture Color No Abnormalities Noted: Yes No Abnormalities Noted: No Erythema: Yes Moisture No Abnormalities Noted: Yes Temperature / Pain Temperature: No  Abnormality Treatment Notes Wound #1 (Foot) Wound Laterality: Dorsal, Right Cleanser Soap and Water Discharge Instruction: May shower and wash wound with dial antibacterial soap and water prior to dressing change. Wound Cleanser Discharge Instruction: Cleanse the wound with wound cleanser prior to applying a clean dressing using gauze sponges, not tissue or cotton balls. Peri-Wound Care Topical Skintegrity Hydrogel 4 (oz) Discharge Instruction:  Apply hydrogel as directed Primary Dressing Promogran Prisma Matrix, 4.34 (sq in) (silver collagen) [name], [name] ([id number]) [phone].pdf Page 8 of 8 Discharge Instruction: Moisten collagen with saline or hydrogel Secondary Dressing Secured With Transpore Surgical Tape, 2x10 (in/yd) Discharge Instruction: Secure dressing with tape as directed. Compression Wrap Kerlix Roll 4.5x3.1 (in/yd) Discharge Instruction: Apply Kerlix and Coban compression as directed. Compression Stockings Add-Ons Electronic Signature(s) Signed: 10/30/2022 4:10:49 PM By: [name] Entered By: [name] on 10/30/2022 15:33:34 -------------------------------------------------------------------------------- Vitals Details Patient Name: Date of Service: [name] RO N [name]. 10/30/2022 3:15 PM Medical Record Number: [id number] Patient Account Number: [id number] Date of Birth/Sex: Treating RN: 11-Jun-1946 (76 y.o. F) Primary Care Bocephus Cali: [name] Other Clinician: Referring Alastair Hennes: Treating Angeli Demilio/Extender: [name] in Treatment: 5 Vital Signs Time Taken: 03:15 Temperature (F): 98.0 Height (in): 64 Pulse (bpm): 71 Weight (lbs): 111 Respiratory Rate (breaths/min): 20 Body Mass Index (BMI): 19.1 Blood Pressure (mmHg): 123/79 Reference Range: 80 - 120 mg / dl Electronic Signature(s) Signed: 10/30/2022 4:10:49 PM By: [name] Entered By: [name] on 10/30/2022 15:26:42

## 2022-10-30 NOTE — Patient Instructions (Signed)
[facility]  Discharge Instructions: Thank you for choosing [facility] to provide your oncology and hematology care.   If you have a lab appointment with the [facility], please go directly to the [facility] and check in at the registration area.   Wear comfortable clothing and clothing appropriate for easy access to any Portacath or PICC line.   We strive to give you quality time with your provider. You may need to reschedule your appointment if you arrive late (15 or more minutes).  Arriving late affects you and other patients whose appointments are after yours.  Also, if you miss three or more appointments without notifying the office, you may be dismissed from the clinic at the provider's discretion.      For prescription refill requests, have your pharmacy contact our office and allow 72 hours for refills to be completed.    Today you received the following chemotherapy and/or immunotherapy agents: Vidaza      To help prevent nausea and vomiting after your treatment, we encourage you to take your nausea medication as directed.  BELOW ARE SYMPTOMS THAT SHOULD BE REPORTED IMMEDIATELY: *FEVER GREATER THAN 100.4 F (38 C) OR HIGHER *CHILLS OR SWEATING *NAUSEA AND VOMITING THAT IS NOT CONTROLLED WITH YOUR NAUSEA MEDICATION *UNUSUAL SHORTNESS OF BREATH *UNUSUAL BRUISING OR BLEEDING *URINARY PROBLEMS (pain or burning when urinating, or frequent urination) *BOWEL PROBLEMS (unusual diarrhea, constipation, pain near the anus) TENDERNESS IN MOUTH AND THROAT WITH OR WITHOUT PRESENCE OF ULCERS (sore throat, sores in mouth, or a toothache) UNUSUAL RASH, SWELLING OR PAIN  UNUSUAL VAGINAL DISCHARGE OR ITCHING   Items with * indicate a potential emergency and should be followed up as soon as possible or go to the Emergency Department if any problems should occur.  Please show the CHEMOTHERAPY ALERT CARD or IMMUNOTHERAPY ALERT CARD at check-in to the  Emergency Department and triage nurse.  Should you have questions after your visit or need to cancel or reschedule your appointment, please contact [facility]  Dept: [phone]  and follow the prompts.  Office hours are 8:00 a.m. to 4:30 p.m. Monday - Friday. Please note that voicemails left after 4:00 p.m. may not be returned until the following business day.  We are closed weekends and major holidays. You have access to a nurse at all times for urgent questions. Please call the main number to the clinic Dept: [phone] and follow the prompts.   For any non-urgent questions, you may also contact your provider using MyChart. We now offer e-Visits for anyone 2 and older to request care online for non-urgent symptoms. For details visit [email or web address].   Also download the MyChart app! Go to the app store, search "MyChart", open the app, select [facility], and log in with your MyChart username and password.  Masks are optional in the cancer centers. If you would like for your care team to wear a mask while they are taking care of you, please let them know. You may have one support person who is at least 76 years old accompany you for your appointments.

## 2022-10-31 ENCOUNTER — Other Ambulatory Visit: Payer: Self-pay | Admitting: Hematology and Oncology

## 2022-10-31 ENCOUNTER — Inpatient Hospital Stay: Payer: Medicare Other

## 2022-10-31 ENCOUNTER — Other Ambulatory Visit: Payer: Self-pay

## 2022-10-31 VITALS — BP 102/76 | HR 75 | Temp 97.7°F | Resp 18

## 2022-10-31 DIAGNOSIS — R21 Rash and other nonspecific skin eruption: Secondary | ICD-10-CM | POA: Diagnosis not present

## 2022-10-31 DIAGNOSIS — C931 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: Secondary | ICD-10-CM

## 2022-10-31 DIAGNOSIS — Z87891 Personal history of nicotine dependence: Secondary | ICD-10-CM | POA: Diagnosis not present

## 2022-10-31 DIAGNOSIS — Z5111 Encounter for antineoplastic chemotherapy: Secondary | ICD-10-CM | POA: Diagnosis not present

## 2022-10-31 MED ORDER — SODIUM CHLORIDE 0.9 % IV SOLN
75.0000 mg/m2 | Freq: Once | INTRAVENOUS | Status: AC
  Administered 2022-10-31: 115 mg via INTRAVENOUS
  Filled 2022-10-31: qty 11.5

## 2022-10-31 MED ORDER — SODIUM CHLORIDE 0.9% FLUSH
10.0000 mL | INTRAVENOUS | Status: DC | PRN
  Administered 2022-10-31: 10 mL

## 2022-10-31 MED ORDER — ONDANSETRON HCL 4 MG/2ML IJ SOLN
8.0000 mg | Freq: Once | INTRAMUSCULAR | Status: AC
  Administered 2022-10-31: 8 mg via INTRAVENOUS
  Filled 2022-10-31: qty 4

## 2022-10-31 MED ORDER — SODIUM CHLORIDE 0.9 % IV SOLN: 8.0000 mg | Freq: Once | INTRAVENOUS | Status: DC

## 2022-10-31 MED ORDER — SODIUM CHLORIDE 0.9 % IV SOLN: Freq: Once | INTRAVENOUS | Status: AC

## 2022-10-31 MED ORDER — HEPARIN SOD (PORK) LOCK FLUSH 100 UNIT/ML IV SOLN
500.0000 [IU] | Freq: Once | INTRAVENOUS | Status: AC | PRN
  Administered 2022-10-31: 500 [IU]

## 2022-10-31 NOTE — Patient Instructions (Signed)
[facility]  Discharge Instructions: Thank you for choosing [facility] to provide your oncology and hematology care.   If you have a lab appointment with the [facility], please go directly to the [facility] and check in at the registration area.   Wear comfortable clothing and clothing appropriate for easy access to any Portacath or PICC line.   We strive to give you quality time with your provider. You may need to reschedule your appointment if you arrive late (15 or more minutes).  Arriving late affects you and other patients whose appointments are after yours.  Also, if you miss three or more appointments without notifying the office, you may be dismissed from the clinic at the provider's discretion.      For prescription refill requests, have your pharmacy contact our office and allow 72 hours for refills to be completed.    Today you received the following chemotherapy and/or immunotherapy agent: Azacitidine (Vidaza)   To help prevent nausea and vomiting after your treatment, we encourage you to take your nausea medication as directed.  BELOW ARE SYMPTOMS THAT SHOULD BE REPORTED IMMEDIATELY: *FEVER GREATER THAN 100.4 F (38 C) OR HIGHER *CHILLS OR SWEATING *NAUSEA AND VOMITING THAT IS NOT CONTROLLED WITH YOUR NAUSEA MEDICATION *UNUSUAL SHORTNESS OF BREATH *UNUSUAL BRUISING OR BLEEDING *URINARY PROBLEMS (pain or burning when urinating, or frequent urination) *BOWEL PROBLEMS (unusual diarrhea, constipation, pain near the anus) TENDERNESS IN MOUTH AND THROAT WITH OR WITHOUT PRESENCE OF ULCERS (sore throat, sores in mouth, or a toothache) UNUSUAL RASH, SWELLING OR PAIN  UNUSUAL VAGINAL DISCHARGE OR ITCHING   Items with * indicate a potential emergency and should be followed up as soon as possible or go to the Emergency Department if any problems should occur.  Please show the CHEMOTHERAPY ALERT CARD or IMMUNOTHERAPY ALERT CARD at  check-in to the Emergency Department and triage nurse.  Should you have questions after your visit or need to cancel or reschedule your appointment, please contact [facility]  Dept: [phone]  and follow the prompts.  Office hours are 8:00 a.m. to 4:30 p.m. Monday - Friday. Please note that voicemails left after 4:00 p.m. may not be returned until the following business day.  We are closed weekends and major holidays. You have access to a nurse at all times for urgent questions. Please call the main number to the clinic Dept: [phone] and follow the prompts.   For any non-urgent questions, you may also contact your provider using MyChart. We now offer e-Visits for anyone 53 and older to request care online for non-urgent symptoms. For details visit [email or web address].   Also download the MyChart app! Go to the app store, search "MyChart", open the app, select [facility], and log in with your MyChart username and password.  Masks are optional in the cancer centers. If you would like for your care team to wear a mask while they are taking care of you, please let them know. You may have one support person who is at least 76 years old accompany you for your appointments. Azacitidine Injection What is this medication? AZACITIDINE (ay [place]) treats blood and bone marrow cancers. It works by slowing down the growth of cancer cells. This medicine may be used for other purposes; ask your health care provider or pharmacist if you have questions. COMMON BRAND NAME(S): Vidaza What should I tell my care team before I take this medication? They  need to know if you have any of these conditions: Kidney disease Liver disease Low blood cell levels, such as low white cells, platelets, or red blood cells Low levels of albumin in the blood Low levels of bicarbonate in the blood An unusual or allergic reaction to azacitidine, mannitol, other medications, foods,  dyes, or preservatives If you or your partner are pregnant or trying to get pregnant Breast-feeding How should I use this medication? This medication is injected into a vein or under the skin. It is given by your care team in a hospital or clinic setting. Talk to your care team about the use of this medication in children. While it may be prescribed for children as young as 1 month for selected conditions, precautions do apply. Overdosage: If you think you have taken too much of this medicine contact a poison control center or emergency room at once. NOTE: This medicine is only for you. Do not share this medicine with others. What if I miss a dose? Keep appointments for follow-up doses. It is important not to miss your dose. Call your care team if you are unable to keep an appointment. What may interact with this medication? Interactions are not expected. This list may not describe all possible interactions. Give your health care provider a list of all the medicines, herbs, non-prescription drugs, or dietary supplements you use. Also tell them if you smoke, drink alcohol, or use illegal drugs. Some items may interact with your medicine. What should I watch for while using this medication? Your condition will be monitored carefully while you are receiving this medication. You may need blood work while taking this medication. This medication may make you feel generally unwell. This is not uncommon as chemotherapy can affect healthy cells as well as cancer cells. Report any side effects. Continue your course of treatment even though you feel ill unless your care team tells you to stop. Other product types may be available that contain the medication azacitidine. The injection and oral products should not be used in place of one another. Talk to your care team if you have questions. This medication can cause serious side effects. To reduce the risk, your care team may give you other medications to take  before receiving this one. Be sure to follow the directions from your care team. This medication may increase your risk of getting an infection. Call your care team for advice if you get a fever, chills, sore throat, or other symptoms of a cold or flu. Do not treat yourself. Try to avoid being around people who are sick. Avoid taking medications that contain aspirin, acetaminophen, ibuprofen, naproxen, or ketoprofen unless instructed by your care team. These medications may hide a fever. This medication may increase your risk to bruise or bleed. Call your care team if you notice any unusual bleeding. Be careful brushing or flossing your teeth or using a toothpick because you may get an infection or bleed more easily. If you have any dental work done, tell your dentist you are receiving this medication. Talk to your care team if you or your partner may be pregnant. Serious birth defects can occur if you take this medication during pregnancy and for 6 months after the last dose. You will need a negative pregnancy test before starting this medication. Contraception is recommended while taking his medication and for 6 months after the last dose. Your care team can help you find the option that works for you. If your partner can get  pregnant, use a condom during sex while taking this medication and for 3 months after the last dose. Do not breastfeed while taking this medication and for 1 week after the last dose. This medication may cause infertility. Talk to your care team if you are concerned about your fertility. What side effects may I notice from receiving this medication? Side effects that you should report to your care team as soon as possible: Allergic reactions--skin rash, itching, hives, swelling of the face, lips, tongue, or throat Infection--fever, chills, cough, sore throat, wounds that don't heal, pain or trouble when passing urine, general feeling of discomfort or being unwell Kidney  injury--decrease in the amount of urine, swelling of the ankles, hands, or feet Liver injury--right upper belly pain, loss of appetite, nausea, light-colored stool, dark yellow or brown urine, yellowing skin or eyes, unusual weakness or fatigue Low red blood cell level--unusual weakness or fatigue, dizziness, headache, trouble breathing Tumor lysis syndrome (TLS)--nausea, vomiting, diarrhea, decrease in the amount of urine, dark urine, unusual weakness or fatigue, confusion, muscle pain or cramps, fast or irregular heartbeat, joint pain Unusual bruising or bleeding Side effects that usually do not require medical attention (report to your care team if they continue or are bothersome): Constipation Diarrhea Nausea Pain, redness, or irritation at injection site Vomiting This list may not describe all possible side effects. Call your doctor for medical advice about side effects. You may report side effects to FDA at 1-800-FDA-1088. Where should I keep my medication? This medication is given in a hospital or clinic. It will not be stored at home. NOTE: This sheet is a summary. It may not cover all possible information. If you have questions about this medicine, talk to your doctor, pharmacist, or health care provider.  2023 Elsevier/Gold Standard (2022-04-27 00:00:00) Ondansetron Injection What is this medication? ONDANSETRON (on DAN se tron) prevents nausea and vomiting from chemotherapy, radiation, or surgery. It works by blocking substances in the body that may cause nausea or vomiting. It belongs to a class of medications called antiemetics. This medicine may be used for other purposes; ask your health care provider or pharmacist if you have questions. COMMON BRAND NAME(S): Zofran, Zofran in Dextrose, Zofran Solution What should I tell my care team before I take this medication? They need to know if you have any of these conditions: Heart disease History of irregular heartbeat Liver  disease Low levels of magnesium or potassium in the blood An unusual or allergic reaction to ondansetron, granisetron, other medications, foods, dyes, or preservatives Pregnant or trying to get pregnant Breast-feeding How should I use this medication? This medication is injected into a vein. It is given by your care team in a hospital or clinic setting. Talk to your care team about the use of this medication in children. Special care may be needed. Overdosage: If you think you have taken too much of this medicine contact a poison control center or emergency room at once. NOTE: This medicine is only for you. Do not share this medicine with others. What if I miss a dose? This does not apply. What may interact with this medication? Do not take this medication with any of the following: Apomorphine Certain medications for fungal infections, such as fluconazole, itraconazole, ketoconazole, posaconazole, voriconazole Cisapride Dronedarone Pimozide Thioridazine This medication may also interact with the following: Carbamazepine Certain medications for depression, anxiety, or mental health conditions Fentanyl Linezolid MAOIs, such as Carbex, Eldepryl, Marplan, Nardil, and Parnate Methylene blue (injected into a vein) Other  medications that cause heart rhythm changes, such as dofetilide, ziprasidone Phenytoin Rifampicin Tramadol This list may not describe all possible interactions. Give your health care provider a list of all the medicines, herbs, non-prescription drugs, or dietary supplements you use. Also tell them if you smoke, drink alcohol, or use illegal drugs. Some items may interact with your medicine. What should I watch for while using this medication? Your condition will be monitored carefully while you are receiving this medication. What side effects may I notice from receiving this medication? Side effects that you should report to your care team as soon as possible: Allergic  reactions--skin rash, itching, hives, swelling of the face, lips, tongue, or throat Bowel blockage--stomach cramping, unable to have a bowel movement or pass gas, loss of appetite, vomiting Chest pain (angina)--pain, pressure, or tightness in the chest, neck, back, or arms Heart rhythm changes--fast or irregular heartbeat, dizziness, feeling faint or lightheaded, chest pain, trouble breathing Irritability, confusion, fast or irregular heartbeat, muscle stiffness, twitching muscles, sweating, high fever, seizure, chills, vomiting, diarrhea, which may be signs of serotonin syndrome Side effects that usually do not require medical attention (report to your care team if they continue or are bothersome): Constipation Diarrhea General discomfort and fatigue Headache This list may not describe all possible side effects. Call your doctor for medical advice about side effects. You may report side effects to FDA at 1-800-FDA-1088. Where should I keep my medication? This medication is given in a hospital or clinic and will not be stored at home. NOTE: This sheet is a summary. It may not cover all possible information. If you have questions about this medicine, talk to your doctor, pharmacist, or health care provider.  2023 Elsevier/Gold Standard (2004-12-30 00:00:00)

## 2022-10-31 NOTE — Progress Notes (Signed)
Pt. complained of nausea and abdominal cramping. States she had it after treatment yesterday and during the night. States she does not want to receive further treatment without nausea medication. MD notified, updated treatment plan with new order received.

## 2022-11-01 ENCOUNTER — Inpatient Hospital Stay: Payer: Medicare Other

## 2022-11-01 ENCOUNTER — Other Ambulatory Visit: Payer: Self-pay

## 2022-11-01 ENCOUNTER — Telehealth: Payer: Self-pay

## 2022-11-01 VITALS — BP 108/63 | HR 84 | Temp 97.7°F | Resp 17

## 2022-11-01 DIAGNOSIS — R21 Rash and other nonspecific skin eruption: Secondary | ICD-10-CM | POA: Diagnosis not present

## 2022-11-01 DIAGNOSIS — Z87891 Personal history of nicotine dependence: Secondary | ICD-10-CM | POA: Diagnosis not present

## 2022-11-01 DIAGNOSIS — Z5111 Encounter for antineoplastic chemotherapy: Secondary | ICD-10-CM | POA: Diagnosis not present

## 2022-11-01 DIAGNOSIS — C931 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: Secondary | ICD-10-CM

## 2022-11-01 MED ORDER — SODIUM CHLORIDE 0.9 % IV SOLN
75.0000 mg/m2 | Freq: Once | INTRAVENOUS | Status: AC
  Administered 2022-11-01: 115 mg via INTRAVENOUS
  Filled 2022-11-01: qty 11.5

## 2022-11-01 MED ORDER — ONDANSETRON HCL 4 MG/2ML IJ SOLN
8.0000 mg | Freq: Once | INTRAMUSCULAR | Status: AC
  Administered 2022-11-01: 8 mg via INTRAVENOUS
  Filled 2022-11-01: qty 4

## 2022-11-01 MED ORDER — HEPARIN SOD (PORK) LOCK FLUSH 100 UNIT/ML IV SOLN
500.0000 [IU] | Freq: Once | INTRAVENOUS | Status: AC | PRN
  Administered 2022-11-01: 500 [IU]

## 2022-11-01 MED ORDER — SODIUM CHLORIDE 0.9% FLUSH
10.0000 mL | INTRAVENOUS | Status: DC | PRN
  Administered 2022-11-01: 10 mL

## 2022-11-01 MED ORDER — SODIUM CHLORIDE 0.9 % IV SOLN: Freq: Once | INTRAVENOUS | Status: AC

## 2022-11-01 NOTE — Telephone Encounter (Signed)
Pt called asking if she could get the same premeds she was receiving with her previous chemo treatment.  Pt stated she's having nausea and today diarrhea and she have more chemo tx this week.  Pt stated she would like to get premeds if possible with tomorrow and Friday's treatment.  Notified Dr. Burr Medico of the pt's request.

## 2022-11-02 ENCOUNTER — Inpatient Hospital Stay: Payer: Medicare Other | Admitting: Dietician

## 2022-11-02 ENCOUNTER — Other Ambulatory Visit: Payer: Self-pay | Admitting: Hematology

## 2022-11-02 ENCOUNTER — Inpatient Hospital Stay: Payer: Medicare Other

## 2022-11-02 ENCOUNTER — Inpatient Hospital Stay (HOSPITAL_BASED_OUTPATIENT_CLINIC_OR_DEPARTMENT_OTHER): Payer: Medicare Other | Admitting: Physician Assistant

## 2022-11-02 VITALS — BP 110/66 | HR 85 | Temp 97.7°F | Resp 16

## 2022-11-02 DIAGNOSIS — C931 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: Secondary | ICD-10-CM

## 2022-11-02 DIAGNOSIS — R21 Rash and other nonspecific skin eruption: Secondary | ICD-10-CM

## 2022-11-02 DIAGNOSIS — Z5111 Encounter for antineoplastic chemotherapy: Secondary | ICD-10-CM | POA: Diagnosis not present

## 2022-11-02 DIAGNOSIS — Z87891 Personal history of nicotine dependence: Secondary | ICD-10-CM | POA: Diagnosis not present

## 2022-11-02 MED ORDER — FAMOTIDINE IN NACL 20-0.9 MG/50ML-% IV SOLN
20.0000 mg | Freq: Once | INTRAVENOUS | Status: AC
  Administered 2022-11-02: 20 mg via INTRAVENOUS
  Filled 2022-11-02: qty 50

## 2022-11-02 MED ORDER — METHYLPREDNISOLONE 4 MG PO TBPK: ORAL_TABLET | ORAL | 0 refills | Status: DC

## 2022-11-02 MED ORDER — ONDANSETRON HCL 4 MG/2ML IJ SOLN
8.0000 mg | Freq: Once | INTRAMUSCULAR | Status: AC
  Administered 2022-11-02: 8 mg via INTRAVENOUS
  Filled 2022-11-02: qty 4

## 2022-11-02 MED ORDER — SODIUM CHLORIDE 0.9 % IV SOLN: Freq: Once | INTRAVENOUS | Status: AC

## 2022-11-02 MED ORDER — SODIUM CHLORIDE 0.9 % IV SOLN
75.0000 mg/m2 | Freq: Once | INTRAVENOUS | Status: AC
  Administered 2022-11-02: 115 mg via INTRAVENOUS
  Filled 2022-11-02: qty 11.5

## 2022-11-02 NOTE — Progress Notes (Addendum)
Symptom Management Consult note [facility]    Patient Care Team: [name], MD as PCP - General (Family Medicine) [name], Wonda Cheng, MD as PCP - Cardiology (Cardiology) Gavin Pound, MD as Consulting Physician (Rheumatology) Truitt Merle, MD as Consulting Physician (Hematology) Juanita Craver, MD as Consulting Physician (Gastroenterology) Garner Nash, DO as Consulting Physician (Pulmonary Disease)    Name of the patient: [name]  [id number]  [birth year]   Date of visit: 11/02/2022   Chief Complaint/Reason for visit: rash   Current Therapy: Vidaza  Last treatment:  Day 3   Cycle 3 on 11/01/22   ASSESSMENT & PLAN: Patient is a 76 y.o. 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 Alvy Bimler.  I have viewed most recent oncology note and lab work.    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 Has extensive history of rashes. -No significant improvement with fluconazole from PCP. A cream is helping, she does not remember the name. -Discussed with Dr. Burr Medico who agrees with prescribing Medrol dosepak. She does not feel this is related to chemotherapy. -Encouraged patient to follow up with dermatologist of pcp for recheck.       Heme/Onc History: Oncology History  CMML (chronic myelomonocytic leukemia) ([place])  11/09/2021 Initial Biopsy   DIAGNOSIS:   -  Monoclonal B-cell population with co-expression of CD5 comprises 17%  of all lymphocytes  -  See comment   COMMENT:  In addition to the clonal B-cell population, there is a myeloblast  population (CD34, CD38, HLA-DR, CD117, CD123 and CD33) that comprises 2% of the total cellular events.  Please see concurrent tissue biopsy (below) for additional work-up and final diagnosis.    FINAL MICROSCOPIC DIAGNOSIS:   A. SOFT TISSUE MASS, PRE SACRAL,  NEEDLE CORE BIOPSY:  -  Chronic lymphocytic leukemia/small lymphocytic lymphoma  -  Extra medullary hematopoiesis  -  See comment   COMMENT:  The biopsy consists of multiple soft tissue cores with lymphoid nodules and a dense hematopoietic infiltrate consistent with extra medullary hematopoiesis.  MPO and E-cadherin highlight myeloid and erythroid precursors respectively.  CD34 highlights increased vasculature and is also positive within the cytoplasm of megakaryocytes.  A few small, immature mononuclear cells appear to be positive for CD34 and CD117. TdT shows rare, scattered positive cells.  CD20 highlights aggregates of B cells which are admixed with CD3 positive T cells.  T cells are an admixture of CD4 and CD8.  The B cells are also positive for CD5, CD23 and Bcl-2.  The B cells do not show significant staining for CD10, BCL6 or cyclin D1.  CD138 highlights scattered plasma cells which are polytypic by kappa and lambda in situ hybridization.  Flow cytometry performed on the sample (see WL [id number]) identified a kappa restricted CD5 positive B-cell population comprising 70% of lymphocytes.  In addition, a small myeloblast population comprised 2% of the total cellular events.   Overall, the findings are consistent with soft tissue involvement by  chronic lymphocytic leukemia/small lymphocytic lymphoma and extra medullary hematopoiesis. In reviewing the patient's CBC data (macrocytic anemia and thrombocytopenia), I would recommend a bone marrow biopsy to assess for marrow involvement by CLL/SLL.    12/23/2021 Imaging   EXAM: CT CHEST, ABDOMEN, AND PELVIS WITH CONTRAST  IMPRESSION: 1. Slight interval enlargement of a presacral  soft tissue mass measuring 7.2 x 4.7 cm, previously 6.9 x 4.1 cm on prior MR of the pelvis dated 07/04/2021. By report, this represents a biopsy proven lymphoma. 2. Pleural nodule of the dependent right lower lobe overlying the posterior right tenth rib and pleural or  paraspinous soft tissue mass overlying the right aspect of the T10 vertebral body, very slightly enlarged compared to prior examination of the chest dated 06/25/2020, consistent with additional sites of lymphomatous involvement given very indolent growth. These could be better assessed for metabolic activity by FDG PET/CT if desired. 3. There is mild, bibasilar predominant pulmonary fibrosis in a pattern featuring irregular peripheral interstitial opacity, septal thickening, but without clear evidence of subpleural bronchiolectasis or honeycombing, with a somewhat asymmetric distribution most conspicuously involving the right lower lobe and lingula. These findings are significantly worsened when compared to prior examination dated 06/25/2020, particularly in the right lower lobe. Given interval change, this may reflect sequelae of interval infection or aspiration, however appearance is generally suspicious for fibrotic interstitial lung disease, and if characterized by ATS pulmonary fibrosis is in an "indeterminate for UIP" pattern, differential considerations including both UIP and NSIP. 4. Emphysema.   Aortic Atherosclerosis (ICD10-I70.0) and Emphysema (ICD10-J43.9).   01/05/2022 Pathology Results   DIAGNOSIS:   BONE MARROW, ASPIRATE, CLOT, CORE:  -Hypercellular bone marrow for age with features of  myelodysplastic/myeloproliferative neoplasm  -Minor abnormal B-cell population  -See comment   PERIPHERAL BLOOD:  -Macrocytic anemia  -Neutrophilic left shift and monocytosis  -Thrombocytopenia   COMMENT:  The bone marrow is hypercellular for age with dyspoietic changes  involving myeloid cell lines associated with monocytosis and increased number of blastic cells (12%) as primarily seen by morphology, many of which display monocytic features.  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  In this background, there are several predominantly small lymphoid aggregates mostly composed of small lymphoid cells.  By flow cytometry, a minor abnormal B-cell population expressing CD5 is seen and representing 2% of all cells.  This correlate with previously known B-cell lymphoproliferative process.  Correlation with cytogenetic and FISH studies is strongly recommended.    DIAGNOSIS:   -Increased number of monocytic cells present (25%)  -Minor abnormal B-cell population identified.  -See comment   COMMENT:  Flow cytometric analysis shows increased number of monocytic cells representing 25% of all cells but without aberrant phenotype or CD34 expression.  A significant CD34-positive blastic population is not identified.  The lymphoid population shows a minor B-cell population representing 2% of all cells and expressing B-cell antigens including CD20 associated with CD5, CD200 and possibly dim kappa expression.  The latter findings are abnormal and correlate with previously known B-cell lymphoproliferative process.  No significant T-cell phenotypic abnormalities identified.    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  female with oncologic history as above presenting to [facility] today with chief complaint of rash x 1 week.  States the rash  started on her groin and has spread to her torso and thighs.  The rash itches.  She saw PCP and was prescribed fluconazole and a cream.  She has been taking these and thinks the cream is helping.  She feels like the rash is spreading.  She is unsure of the cause of the rash.  She denies any new lotions, soaps, detergents or clothes.  No one at home has similar rash.  She has been taking Claritin daily.  Patient admits having history of rashes in the past and is unsure exactly of the cause of them either.  She has not been evaluated by dermatology before.  She denies any fever or chills.        ROS  All other systems are reviewed and are negative for acute change except as noted in the HPI.    Allergies  Allergen Reactions   Digoxin And Related Rash    Generalized total body rash with immense itching   Gluten Meal Diarrhea   Penicillins Rash and Other (See Comments)      Did it involve swelling of the face/tongue/throat, SOB, or low BP? No Did it involve sudden or severe rash/hives, skin peeling, or any reaction on the inside of your mouth or nose? Yes Did you need to seek medical attention at a hospital or doctor's office? No When did it last happen?    "Many Years Ago"  If all above answers are "NO", may proceed with cephalosporin use.      Alendronate Rash   Hydroxychloroquine Rash     Past Medical History:  Diagnosis Date   Arthritis    Rheumatoid arthritis   Celiac disease    Chronic kidney disease    stage 3 from MD notes   COPD (chronic obstructive pulmonary disease) (HCC)    Dyspnea    with going up stairs   Family history of adverse reaction to anesthesia    father had hard time waking up   Headache    sinus headaches   Hot flashes    Hypertension    Iron deficiency anemia    Pneumonia    per patient "I have walking pneumonia"     Past Surgical History:  Procedure Laterality Date   COLONOSCOPY     ECTOPIC PREGNANCY SURGERY      x 2   IR IMAGING  GUIDED PORT INSERTION  01/31/2022   REVERSE SHOULDER ARTHROPLASTY Right 02/02/2017   Procedure: RIGHT REVERSE SHOULDER ARTHROPLASTY;  Surgeon: Netta Cedars, MD;  Location: [place];  Service: Orthopedics;  Laterality: Right;    Social History   Socioeconomic History   Marital status: Married    Spouse name: Not on file   Number of children: Not on file   Years of education: Not on file   Highest education level: Not on file  Occupational History   Not on file  Tobacco Use   Smoking status: Former    Packs/day: 1.00    Years: 30.00    Total pack years: 30.00    Types: Cigarettes    Quit date: 2005    Years since quitting: 18.8   Smokeless tobacco: Never  Vaping Use   Vaping Use: Never used  Substance and Sexual Activity   Alcohol use: Yes    Alcohol/week: 12.0 - 14.0 standard drinks of alcohol    Types:  12 - 14 Glasses of wine per week   Drug use: No   Sexual activity: Not Currently  Other Topics Concern   Not on file  Social History Narrative   Not on file   Social Determinants of Health   Financial Resource Strain: Not on file  Food Insecurity: Not on file  Transportation Needs: Not on file  Physical Activity: Not on file  Stress: Not on file  Social Connections: Not on file  Intimate Partner Violence: Not on file    Family History  Problem Relation Age of Onset   Peripheral Artery Disease Father    Diabetes Father      Current Outpatient Medications:    methylPREDNISolone (MEDROL DOSEPAK) 4 MG TBPK tablet, Take 6 pills by mouth day 1, 5 on day 2, 4 on day 3, 3 on day 4, 2 on day 5, 1 on day 6, Disp: 21 tablet, Rfl: 0   denosumab (PROLIA) 60 MG/ML SOSY injection, Inject 60 mg into the skin every 6 (six) months., Disp: , Rfl:    diltiazem (CARDIZEM CD) 240 MG 24 hr capsule, Take 1 capsule (240 mg total) by mouth daily., Disp: 30 capsule, Rfl: 6   fluconazole (DIFLUCAN) 150 MG tablet, Take 150 mg by mouth daily., Disp: , Rfl:    furosemide (LASIX) 20 MG tablet,  Take 1 tablet (20 mg total) by mouth daily., Disp: 30 tablet, Rfl: 11   gabapentin (NEURONTIN) 300 MG capsule, Take 300 mg by mouth in the morning., Disp: , Rfl:    InFLIXimab (REMICADE IV), Inject 10 mg into the vein every 2 (two) months., Disp: , Rfl:    Metoprolol Tartrate 75 MG TABS, Take 150 mg by mouth 2 (two) times daily., Disp: 360 tablet, Rfl: 3   mirtazapine (REMERON) 7.5 MG tablet, Take 1 tablet (7.5 mg total) by mouth at bedtime., Disp: 30 tablet, Rfl: 0   nystatin cream (MYCOSTATIN), Apply topically 2 (two) times daily., Disp: , Rfl:    potassium chloride SA (KLOR-CON M20) 20 MEQ tablet, Take 1 tablet (20 mEq total) by mouth daily. Take with furosemide, Disp: 90 tablet, Rfl: 3   predniSONE (DELTASONE) 5 MG tablet, Take 5 mg by mouth daily with breakfast., Disp: , Rfl:    Tiotropium Bromide-Olodaterol (STIOLTO RESPIMAT) 2.5-2.5 MCG/ACT AERS, Inhale 2 puffs into the lungs daily., Disp: 4 g, Rfl: 5 No current facility-administered medications for this visit.  Facility-Administered Medications Ordered in Other Visits:    acetaminophen (TYLENOL) 325 MG tablet, , , ,    diphenhydrAMINE (BENADRYL) 25 mg capsule, , , ,   PHYSICAL EXAM: ECOG FS:1 - Symptomatic but completely ambulatory   T: 97.7    BP: 110/56    HR: 85   Resp: 18   O2: 95% Physical Exam Vitals and nursing note reviewed.  Constitutional:      Appearance: She is well-developed. She is not ill-appearing or toxic-appearing.  HENT:     Head: Normocephalic.     Nose: Nose normal.     Mouth/Throat:     Comments: No oral lesions Eyes:     Conjunctiva/sclera: Conjunctivae normal.  Neck:     Vascular: No JVD.  Cardiovascular:     Rate and Rhythm: Normal rate and regular rhythm.     Pulses: Normal pulses.     Heart sounds: Normal heart sounds.  Pulmonary:     Effort: Pulmonary effort is normal.     Breath sounds: Normal breath sounds.  Abdominal:  General: There is no distension.  Musculoskeletal:     Cervical  back: Normal range of motion.  Skin:    General: Skin is warm and dry.     Findings: Rash present.     Comments: Dry flaking skin on majority of body except face. Macular rash on torso, groin, arms. No rash on palms  Please see media below  Neurological:     Mental Status: She is oriented to person, place, and time.        LABORATORY DATA: I have reviewed the data as listed    Latest Ref Rng & Units 10/30/2022   11:05 AM 10/05/2022   11:44 AM 09/20/2022    1:59 PM  CBC  WBC 4.0 - 10.5 K/uL 11.0  4.6  8.5   Hemoglobin 12.0 - 15.0 g/dL 10.8  9.5  9.1   Hematocrit 36.0 - 46.0 % 32.7  29.6  28.6   Platelets 150 - 400 K/uL 122  41  123         Latest Ref Rng & Units 10/30/2022   11:05 AM 10/05/2022   11:44 AM 09/18/2022   12:17 PM  CMP  Glucose 70 - 99 mg/dL 128  128  189   BUN 8 - 23 mg/dL 17  20  32   Creatinine 0.44 - 1.00 mg/dL 0.87  0.94  1.15   Sodium 135 - 145 mmol/L 134  137  135   Potassium 3.5 - 5.1 mmol/L 5.1  4.3  4.3   Chloride 98 - 111 mmol/L 100  101  101   CO2 22 - 32 mmol/L _0 Calcium 8.9 - 10.3 mg/dL 9.1  9.0  8.8   Total Protein 6.5 - 8.1 g/dL  6.0    Total Bilirubin 0.3 - 1.2 mg/dL  0.9    Alkaline Phos 38 - 126 U/L  49    AST 15 - 41 U/L  21    ALT 0 - 44 U/L  20         RADIOGRAPHIC STUDIES (from last 24 hours if applicable) I have personally reviewed the radiological images as listed and agreed with the findings in the report. No results found.      Visit Diagnosis: 1. Rash   2. 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 ([place])      No orders of the defined types were placed in this encounter.   All questions were answered. The patient knows to call the clinic with any problems, questions or concerns. No barriers to learning was detected.  I have spent a total of 20 minutes minutes of face-to-face and non-face-to-face time, preparing to see the patient, obtaining and/or reviewing separately obtained  history, performing a medically appropriate examination, counseling and educating the patient, prescribing medications, documenting clinical information in the electronic health record, and care coordination (communications with other health care professionals or caregivers).    Thank you for allowing me to participate in the care of this patient.    Barrie Folk, PA-C Department of Hematology/Oncology [facility] Phone: [phone]  [phone]    11/02/2022 5:14 PM

## 2022-11-02 NOTE — Progress Notes (Signed)
Nutrition Follow-up:  Patient with CMML. She is currently receiving azacitidine q28d.   Met with patient during infusion. She reports not doing as well as she had hoped. Patient is observed scratching her hands/arms secondary to rash. Patient says it started on her belly and has now spread to chest and bilateral arms. Her PCP has prescribed a cream. This is working well for her. She says her hair is falling out. Patient endorses good appetite. She is eating 2 meals and drinking one Boost. Patient did not increase this as recommended. States she forgot about doing this. Patient enjoys breakfast the most (various fruits, sausage, eggs, pancakes). She drinks a Boost during this time as well. Patient says the Boost are filling and does not eat again until supper. She had jambalaya for dinner.   Medications: reviewed   Labs: Na 134, glucose 128  Anthropometrics: Weight 107 lb 9.6 oz today increased  10/12 - 103 lb 14.4 oz  9/25 - 111 lb 6.4 oz    NUTRITION DIAGNOSIS: Inadequate oral intake continues    INTERVENTION:  Drink 2 boost plus/equivalent twice daily (before lunch, after dinner) this was written down for pt    MONITORING, EVALUATION, GOAL: weight trends, intake   NEXT VISIT: Tuesday December 19 during infusion

## 2022-11-03 ENCOUNTER — Inpatient Hospital Stay: Payer: Medicare Other

## 2022-11-03 VITALS — BP 91/81 | HR 82 | Temp 97.8°F | Resp 17

## 2022-11-03 DIAGNOSIS — Z87891 Personal history of nicotine dependence: Secondary | ICD-10-CM | POA: Diagnosis not present

## 2022-11-03 DIAGNOSIS — C931 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: Secondary | ICD-10-CM | POA: Diagnosis not present

## 2022-11-03 DIAGNOSIS — R21 Rash and other nonspecific skin eruption: Secondary | ICD-10-CM | POA: Diagnosis not present

## 2022-11-03 DIAGNOSIS — Z5111 Encounter for antineoplastic chemotherapy: Secondary | ICD-10-CM | POA: Diagnosis not present

## 2022-11-03 MED ORDER — HEPARIN SOD (PORK) LOCK FLUSH 100 UNIT/ML IV SOLN
500.0000 [IU] | Freq: Once | INTRAVENOUS | Status: AC | PRN
  Administered 2022-11-03: 500 [IU]

## 2022-11-03 MED ORDER — SODIUM CHLORIDE 0.9 % IV SOLN: Freq: Once | INTRAVENOUS | Status: AC

## 2022-11-03 MED ORDER — SODIUM CHLORIDE 0.9 % IV SOLN
75.0000 mg/m2 | Freq: Once | INTRAVENOUS | Status: AC
  Administered 2022-11-03: 115 mg via INTRAVENOUS
  Filled 2022-11-03: qty 11.5

## 2022-11-03 MED ORDER — SODIUM CHLORIDE 0.9% FLUSH
10.0000 mL | INTRAVENOUS | Status: DC | PRN
  Administered 2022-11-03: 10 mL

## 2022-11-03 MED ORDER — ONDANSETRON HCL 4 MG/2ML IJ SOLN
8.0000 mg | Freq: Once | INTRAMUSCULAR | Status: AC
  Administered 2022-11-03: 8 mg via INTRAVENOUS
  Filled 2022-11-03: qty 4

## 2022-11-06 ENCOUNTER — Encounter (HOSPITAL_BASED_OUTPATIENT_CLINIC_OR_DEPARTMENT_OTHER): Payer: Medicare Other | Admitting: General Surgery

## 2022-11-06 DIAGNOSIS — E11621 Type 2 diabetes mellitus with foot ulcer: Secondary | ICD-10-CM | POA: Diagnosis not present

## 2022-11-06 DIAGNOSIS — I5022 Chronic systolic (congestive) heart failure: Secondary | ICD-10-CM | POA: Diagnosis not present

## 2022-11-06 DIAGNOSIS — L97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 evidence of necrosis: Secondary | ICD-10-CM | POA: Diagnosis not present

## 2022-11-06 DIAGNOSIS — M069 Rheumatoid arthritis, unspecified: Secondary | ICD-10-CM | POA: Diagnosis not present

## 2022-11-06 DIAGNOSIS — L97812 Non-pressure chronic ulcer of other part of right lower leg with fat layer exposed: Secondary | ICD-10-CM | POA: Diagnosis not present

## 2022-11-06 DIAGNOSIS — L97512 Non-pressure chronic ulcer of other part of right foot with fat layer exposed: Secondary | ICD-10-CM | POA: Diagnosis not present

## 2022-11-06 DIAGNOSIS — Z7952 Long term (current) use of systemic steroids: Secondary | ICD-10-CM | POA: Diagnosis not present

## 2022-11-06 DIAGNOSIS — I890 Lymphedema, not elsewhere classified: Secondary | ICD-10-CM | POA: Diagnosis not present

## 2022-11-06 NOTE — Progress Notes (Signed)
[name], [name] ([id number]) [id number] Page 1 of 9 Visit Report for 11/06/2022 Chief Complaint Document Details Patient Name: Date of Service: [name], [name] 11/06/2022 9:45 A M Medical Record Number: [id number] Patient Account Number: [id number] Date of Birth/Sex: Treating RN: 10/31/1946 (76 y.o. F) Primary Care Provider: [name] Other Clinician: Referring Provider: Treating Provider/Extender: [name] in Treatment: 6 Information Obtained from: Patient Chief Complaint Patient seen for complaints of Non-Healing Wounds. Electronic Signature(s) Signed: 11/06/2022 10:28:17 AM By: Fredirick Maudlin MD FACS Previous Signature: 11/06/2022 10:26:30 AM Version By: Fredirick Maudlin MD FACS Entered By: Fredirick Maudlin on 11/06/2022 10:28:17 -------------------------------------------------------------------------------- Debridement Details Patient Name: Date of Service: Sherry Masson RO N [name]. 11/06/2022 9:45 A M Medical Record Number: [id number] Patient Account Number: [id number] Date of Birth/Sex: Treating RN: Sep 15, 1946 (76 y.o. [name], [name] Primary Care Provider: [name] Other Clinician: Referring Provider: Treating Provider/Extender: [name] in Treatment: 6 Debridement Performed for Assessment: Wound #1 Right,Dorsal Foot Performed By: Physician Fredirick Maudlin, MD Debridement Type: Debridement Level of Consciousness (Pre-procedure): Awake and Alert Pre-procedure Verification/Time Out Yes - 10:15 Taken: Start Time: 10:16 Pain Control: Lidocaine 5% topical ointment T Area Debrided (L x W): otal 1.1 (cm) x 1 (cm) = 1.1 (cm) Tissue and other material debrided: Viable, Non-Viable, Slough, Subcutaneous, Slough Level: Skin/Subcutaneous Tissue Debridement Description: Excisional Instrument: Curette Bleeding: Minimum Hemostasis Achieved: Pressure Procedural Pain:  0 Post Procedural Pain: 0 Response to Treatment: Procedure was tolerated well Level of Consciousness (Post- Awake and Alert procedure): Post Debridement Measurements of Total Wound Length: (cm) 1.1 Width: (cm) 1 Depth: (cm) 0.2 Volume: (cm) 0.173 Character of Wound/Ulcer Post Debridement: Requires Further Debridement Post Procedure Diagnosis [name], [name] [name] ([id number]) [id number] Page 2 of 9 Same as Pre-procedure Notes Scribed for Dr. [name] by Blanche East, RN Electronic Signature(s) Signed: 11/06/2022 10:59:09 AM By: Fredirick Maudlin MD FACS Signed: 11/06/2022 5:04:49 PM By: Blanche East RN Entered By: Blanche East on 11/06/2022 10:18:16 -------------------------------------------------------------------------------- HPI Details Patient Name: Date of Service: Sherry Masson RO N [name]. 11/06/2022 9:45 A M Medical Record Number: [id number] Patient Account Number: [id number] Date of Birth/Sex: Treating RN: January 09, 1946 (75 y.o. F) Primary Care Provider: [name] Other Clinician: Referring Provider: Treating Provider/Extender: [name] in Treatment: 6 History of Present Illness HPI Description: ADMISSION 09/20/2022 This is a 76 year old woman with a past medical history significant for congestive heart failure, atrial fibrillation, rheumatoid arthritis on long-term steroid treatment, CMML currently receiving chemotherapy and moderate malnutrition. She presents to clinic today with a wound on the dorsum of her right foot. She says it started out as a bruise and then developed a bump which subsequently broke open. She says she was sent to our clinic by her oncologist. She is not diabetic and does not smoke. ABI in clinic today was 1.55. She has had formal segmental arterial Dopplers done, a little over a year ago, which did not show any evidence of occlusive vascular disease. On the dorsal aspect of the right foot,  there is a wound with a lot of old hematoma, slough, eschar, and and nonviable subcutaneous tissue. T the best of my o ability to discern, it does appear to involve the muscle layer, but there is no evidence of necrosis. The wound is pouring serous fluid; the patient has 3+ pitting edema to the bilateral lower extremities. 09/29/2022: The patient came to clinic today with nothing but a T overlying her wound. She is accompanied by her  husband today. She has not elfa communicated with her cardiologist regarding her fluid overload. She still has 3+ pitting edema bilaterally. The wound is still pouring serous fluid, although not quite as effusively as at her initial visit. There is still quite a bit of nonviable tissue and hematoma present. 10/13; patient's wound on the right lateral lower leg his eschared over and may well be on its way to healing. The real problem here is on the right dorsal foot just proximal to her toes. The patient is still puzzled by the cause of this. Looking at a picture on the patient's smart phone there is a suggestion that this might have been a hematoma which seems to fit what Dr. [name] had felt on her initial evaluation. They have been using iodoform packing kerlix and Coban. 10/20; right lower leg is healed. We are approved for snap VAC on the right foot. We will try to arrange home health but until then she is going to need a nurse visit on Tuesday 10/20/2022: It has been 2 weeks since I have seen the wound. It has improved significantly since then. She is currently in a snap VAC and I think this has made quite a difference. The overall wound measurements are smaller. There is no massive drainage, as there had been previously. There is some good granulation tissue forming underneath a bit of slough. 10/30/2022: The wound continues to improve with use of the snap VAC. The swelling in her foot is better. The undermining continues to close in. There is a layer of slough  overlying good granulation tissue. 11/06/2022: The wound has contracted further. The undermining is still present but the wound surface is better and her edema control is good. Electronic Signature(s) Signed: 11/06/2022 10:28:47 AM By: Fredirick Maudlin MD FACS Entered By: Fredirick Maudlin on 11/06/2022 10:28:45 -------------------------------------------------------------------------------- Physical Exam Details Patient Name: Date of Service: Sherry Masson RO N [name]. 11/06/2022 9:45 A M Medical Record Number: [id number] Patient Account Number: [id number] Date of Birth/Sex: Treating RN: Jun 11, 1946 (76 y.o. [name], [name] ([id number]) [id number] Page 3 of 9 Primary Care Provider: [name] Other Clinician: Referring Provider: Treating Provider/Extender: [name] in Treatment: 6 Constitutional . . . . No acute distress.Marland Kitchen Respiratory Normal work of breathing on room air.. Notes 11/06/2022: The wound has contracted further. The undermining is still present but the wound surface is better and her edema control is good. Electronic Signature(s) Signed: 11/06/2022 10:29:11 AM By: Fredirick Maudlin MD FACS Entered By: Fredirick Maudlin on 11/06/2022 10:29:10 -------------------------------------------------------------------------------- Physician Orders Details Patient Name: Date of Service: Sherry Masson RO N [name]. 11/06/2022 9:45 A M Medical Record Number: [id number] Patient Account Number: [id number] Date of Birth/Sex: Treating RN: 1946/11/08 (76 y.o. 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 - Dr. [name] Rm 4 Return A Bathing/ Shower/ Hygiene May shower with protection but do not get wound dressing(s) wet. Negative Presssure Wound Therapy SNAP Vac to wound continuously at  130m/hg pressure Blue Foam Edema Control - Lymphedema / SCD / Other Left Lower Extremity Elevate legs to the level of the heart or above for 30 minutes daily and/or when sitting, a frequency of: Avoid standing for long periods of time. Patient to wear own compression stockings every day. Compression stocking or Garment 20-30 mm/Hg pressure to: - left leg Home Health dmit to Home Health for wound care. May utilize formulary equivalent dressing for wound treatment orders unless otherwise specified. - A For skilled nursing for wound care. New wound care orders this week; continue Home Health for wound care. May utilize formulary equivalent dressing for wound treatment orders unless otherwise specified. - Skilled nursing referral for wound care Wound Treatment Wound #1 - Foot Wound Laterality: Dorsal, Right Cleanser: Soap and Water 1 x Per Day/30 Days Discharge Instructions: May shower and wash wound with dial antibacterial soap and water prior to dressing change. SJENNAMARIE, GOINGS([id number] Page 4 of 9 Cleanser: Wound Cleanser 1 x Per Day/30 Days Discharge Instructions: Cleanse the wound with wound cleanser prior to applying a clean dressing using gauze sponges, not tissue or cotton balls. Topical: Skintegrity Hydrogel 4 (oz) 1 x Per Day/30 Days Discharge Instructions: Apply hydrogel as  directed Prim Dressing: Promogran Prisma Matrix, 4.34 (sq in) (silver collagen) 1 x Per Day/30 Days ary Discharge Instructions: Moisten collagen with saline or hydrogel Secured With: Transpore Surgical Tape, 2x10 (in/yd) 1 x Per Day/30 Days Discharge Instructions: Secure dressing with tape as directed. Compression Wrap: Kerlix Roll 4.5x3.1 (in/yd) (Generic) 1 x Per Day/30 Days Discharge Instructions: Apply Kerlix and Coban compression as directed. Electronic Signature(s) Signed: 11/06/2022 10:59:09 AM By: CFredirick MaudlinMD FACS Entered By: CFredirick Maudlinon 11/06/2022 10:29:23 -------------------------------------------------------------------------------- Problem List Details Patient Name: Date of Service: SLars MassonRO N [name]. 11/06/2022 9:45 A M Medical Record Number: [id number] Account Number: [id number] of Birth/Sex: Treating RN: 1February 20, 1947(76y.o. 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 09/20/2022 No Yes without evidence of necrosis L97.812 Non-pressure chronic ulcer of other part of right lower leg with fat layer 09/20/2022 No Yes exposed IT62.26Chronic systolic (congestive) heart failure 09/20/2022 No Yes M06.9 Rheumatoid arthritis, unspecified 09/20/2022 No Yes Z79.52 Long term (current) use of systemic steroids 09/20/2022 No Yes I70.90 Unspecified atherosclerosis 09/20/2022 No Yes 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 11/06/2022 9:45 A M Medical Record Number: [id number] Account Number: [id number] of Birth/Sex: Treating RN: 1February 10, 1947(76y.o. F) Primary Care Provider: GKelton PillarOther Clinician: Referring Provider: Treating Provider/Extender: CTonna Cornerin Treatment: 6 Subjective Chief Complaint Information obtained from Patient Patient seen for complaints of Non-Healing Wounds. History of Present Illness (HPI) ADMISSION 09/20/2022 This is a 76year old woman with a past medical history significant for congestive heart failure, atrial fibrillation, rheumatoid arthritis on long-term steroid treatment, CMML currently receiving chemotherapy and moderate malnutrition. She presents to clinic today with a wound on the  dorsum of her right foot. She says it started out as a bruise and then developed a bump which subsequently broke open. She says she was sent to our clinic by her oncologist. She is not diabetic and does not smoke. ABI in clinic today was 1.55. She has had formal segmental arterial Dopplers done, a little over a year ago, which did not show any evidence of occlusive vascular disease. On the dorsal aspect of the right foot, there is a wound with a lot of old hematoma, slough, eschar, and and nonviable subcutaneous tissue. T the best of my o ability to discern, it does appear to involve the muscle layer, but there is no evidence of necrosis. The wound is pouring serous fluid; the patient has 3+ pitting edema to the bilateral lower extremities. 09/29/2022: The patient came to clinic today with nothing but a T overlying her wound. She is accompanied by her husband today. She has  not elfa communicated with her cardiologist regarding her fluid overload. She still has 3+ pitting edema bilaterally. The wound is still pouring serous fluid, although not quite as effusively as at her initial visit. There is still quite a bit of nonviable tissue and hematoma present. 10/13; patient's wound on the right lateral lower leg his eschared over and may well be on its way to healing. The real problem here is on the right dorsal foot just proximal to her toes. The patient is still puzzled by the cause of this. Looking at a picture on the patient's smart phone there is a suggestion that this might have been a hematoma which seems to fit what Dr. [name] had felt on her initial evaluation. They have been using iodoform packing kerlix and Coban. 10/20; right lower leg is healed. We are approved for snap VAC on the right foot. We will try to arrange home health but until then she is going to need a nurse visit on Tuesday 10/20/2022: It has been 2 weeks since I have seen the wound. It has improved significantly since then. She is currently in a snap VAC and I think this has made quite a difference. The overall wound measurements are smaller. There is no massive drainage, as there had been previously. There is some good granulation tissue forming underneath a bit of slough. 10/30/2022: The wound continues to improve with use of the snap VAC. The swelling in her foot is better. The undermining continues to close in. There is a layer of slough overlying good granulation tissue. 11/06/2022: The wound has contracted further. The undermining is still present but the wound surface is better and her edema control is good. Patient History Information obtained from Patient. Family History Unknown History, Diabetes - Father, Heart Disease - Father, No family history of Cancer. Social History Former smoker - quit 2003, Marital Status - Married, Alcohol Use - Daily, Drug Use - Prior History, Caffeine Use -  Daily. Medical History Eyes Denies history of Cataracts, Glaucoma, Optic Neuritis Ear/Nose/Mouth/Throat Denies history of Chronic sinus problems/congestion, Middle ear problems Respiratory Patient has history of Asthma, Chronic Obstructive Pulmonary Disease (COPD) [name], [name] ([id number]) [id number] Page 6 of 9 Cardiovascular Patient has history of Congestive Heart Failure, Hypertension Denies history of Coronary Artery Disease, Deep Vein Thrombosis, Hypotension, Myocardial Infarction Endocrine Denies history of Type I Diabetes, Type II Diabetes Immunological Denies history of Lupus Erythematosus, Raynaudoos, Scleroderma Integumentary (Skin) Denies history of History of Burn Musculoskeletal Patient has history of Rheumatoid Arthritis Denies history of Gout Neurologic Patient has  history of Neuropathy Oncologic Patient has history of Received Chemotherapy - currently receiving Hospitalization/Surgery History - reverse shoulder arthroplasty Rt- 2018. Medical A Surgical History Notes nd Cardiovascular AFIB Genitourinary Stage 3 CKD Objective Constitutional No acute distress.. Vitals Time Taken: 9:55 AM, Height: 64 in, Weight: 111 lbs, BMI: 19.1, Temperature: 97.6 F, Pulse: 78 bpm, Respiratory Rate: 16 breaths/min, Blood Pressure: 111/74 mmHg. Respiratory Normal work of breathing on room air.. General Notes: 11/06/2022: The wound has contracted further. The undermining is still present but the wound surface is better and her edema control is good. Integumentary (Hair, Skin) Wound #1 status is Open. Original cause of wound was Blister. The date acquired was: 08/23/2022. The wound has been in treatment 6 weeks. The wound is located on the Right,Dorsal Foot. The wound measures 1.1cm length x 1cm width x 0.2cm depth; 0.864cm^2 area and 0.173cm^3 volume. There is Fat Layer (Subcutaneous Tissue) exposed. Tunneling has been noted at 3:00 with a  maximum distance of 0.8cm. Undermining begins at 10:00 and ends at 2:00 with a maximum distance of 0.5cm. There is a medium amount of serosanguineous drainage noted. The wound margin is distinct with the outline attached to the wound base. There is medium (34-66%) red granulation within the wound bed. There is a medium (34-66%) amount of necrotic tissue within the wound bed including Adherent Slough. The periwound skin appearance had no abnormalities noted for texture. The periwound skin appearance had no abnormalities noted for moisture. The periwound skin appearance exhibited: Erythema. The surrounding wound skin color is noted with erythema. Periwound temperature was noted as No Abnormality. 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 There was a Excisional Skin/Subcutaneous Tissue Debridement with a total area of 1.1 sq cm performed by Fredirick Maudlin, MD. With the following instrument(s): Curette to remove Viable and Non-Viable tissue/material. Material removed includes Subcutaneous Tissue and Slough and after achieving pain control using Lidocaine 5% topical ointment. No specimens were taken. A time out was conducted at 10:15, prior to the start of the procedure. A Minimum amount of bleeding was controlled with Pressure. The procedure was tolerated [name], [name] ([id number]) [phone].pdf Page 7 of 9 well with a pain level of 0 throughout and a pain level of 0  following the procedure. Post Debridement Measurements: 1.1cm length x 1cm width x 0.2cm depth; 0.173cm^3 volume. Character of Wound/Ulcer Post Debridement requires further debridement. Post procedure Diagnosis Wound #1: Same as Pre-Procedure General Notes: Scribed for Dr. [name] by Blanche East, RN. Plan Follow-up Appointments: Return Appointment in 1 week. - Dr. [name] Rm 4 Bathing/ Shower/ Hygiene: May shower with protection but do not get wound dressing(s) wet. Negative Presssure Wound Therapy: SNAP Vac to wound continuously at 166m/hg pressure Blue Foam Edema Control - Lymphedema / SCD / Other: Elevate legs to the level of the heart or above for 30 minutes daily and/or when sitting, a frequency of: Avoid standing for long periods of time. Patient to wear own compression stockings every day. Compression stocking or Garment 20-30 mm/Hg pressure to: - left leg [facility] May utilize formulary  equivalent dressing for wound treatment orders unless otherwise specified. - For skilled nursing for wound care. New wound care orders this week; continue Home Health for wound care. May utilize formulary equivalent dressing for wound treatment orders unless otherwise specified. - Skilled nursing referral for wound care WOUND #1: - Foot Wound Laterality: Dorsal, Right Cleanser: Soap and Water 1 x Per Day/30 Days Discharge Instructions: May shower and wash wound with dial antibacterial soap and water prior to dressing change. Cleanser: Wound Cleanser 1 x Per Day/30 Days Discharge Instructions: Cleanse the wound with wound cleanser prior to applying a clean dressing using gauze sponges, not tissue or cotton balls. Topical: Skintegrity Hydrogel 4 (oz) 1 x Per Day/30 Days Discharge Instructions: Apply hydrogel as directed Prim Dressing: Promogran Prisma Matrix, 4.34 (sq in) (silver collagen) 1 x Per Day/30 Days ary Discharge Instructions: Moisten collagen  with saline or hydrogel Secured With: Transpore Surgical T ape, 2x10 (in/yd) 1 x Per Day/30 Days Discharge Instructions: Secure dressing with tape as directed. Com pression Wrap: Kerlix Roll 4.5x3.1 (in/yd) (Generic) 1 x Per Day/30 Days Discharge Instructions: Apply Kerlix and Coban compression as directed. 11/06/2022: The wound has contracted further. The undermining is still present but the wound surface is better and her edema control is good. I used a curette to debride slough and nonviable subcutaneous tissue from the patient's wound. We will continue to use Prisma silver collagen and the snap VAC. Follow-up in 1 week. Electronic Signature(s) Signed: 11/06/2022 10:29:57 AM By: Fredirick Maudlin MD FACS Entered By: Fredirick Maudlin on 11/06/2022 10:29:57 -------------------------------------------------------------------------------- HxROS Details Patient Name: Date of Service: Sherry Masson RO N [name]. 11/06/2022 9:45 A M Medical Record Number: [id number] Patient Account Number: [id number] Date of Birth/Sex: Treating RN: May 15, 1946 (76 y.o. F) Primary Care Provider: [name] Other Clinician: Referring Provider: Treating Provider/Extender: [name] in Treatment: 6 Information Obtained From Patient Eyes Medical History: Negative for: Cataracts; Glaucoma; Optic Neuritis [name], [name] ([id number]) [phone].pdf Page 8 of 9 Ear/Nose/Mouth/Throat Medical History: Negative for: Chronic sinus problems/congestion; Middle ear problems Respiratory Medical History: Positive for: Asthma; Chronic Obstructive Pulmonary Disease (COPD) Cardiovascular Medical History: Positive for: Congestive Heart Failure; Hypertension Negative for: Coronary Artery Disease; Deep Vein Thrombosis; Hypotension; Myocardial Infarction Past Medical History Notes: AFIB Endocrine Medical History: Negative for: Type I Diabetes; Type II  Diabetes Genitourinary Medical History: Past Medical History Notes: Stage 3 CKD Immunological Medical History: Negative for: Lupus Erythematosus; Raynauds; Scleroderma Integumentary (Skin) Medical History: Negative for: History of Burn Musculoskeletal Medical History: Positive for: Rheumatoid Arthritis Negative for: Gout Neurologic Medical History: Positive for: Neuropathy Oncologic Medical History: Positive for: Received Chemotherapy - currently receiving Immunizations Pneumococcal Vaccine: Received Pneumococcal Vaccination: Yes Received Pneumococcal Vaccination On or After 60th Birthday: Yes Implantable Devices None Hospitalization / Surgery History Type of Hospitalization/Surgery reverse shoulder arthroplasty Rt- 2018 Family and Social History Unknown History: Yes; Cancer: No; Diabetes: Yes - Father; Heart Disease: Yes - Father; Former smoker - quit 2003; Marital Status - Married; Alcohol Use: Daily; Drug Use: Prior History; Caffeine Use: Daily; Financial Concerns: No; Food, Clothing or Shelter Needs: No; Support System Lacking: No; Transportation Concerns: No Electronic Signature(s) Signed: 11/06/2022 10:59:09 AM By: Fredirick Maudlin MD FACS Entered By: Fredirick Maudlin on 11/06/2022 10:28:52 [facility], [name] ([id number]) [id number] Page 9 of 9 -------------------------------------------------------------------------------- SuperBill Details Patient Name: Date of Service: [name], [name] 11/06/2022 Medical Record Number: [id number] Patient Account Number: [id number] Date of Birth/Sex: Treating RN: 08-06-1946 (76 y.o. 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 Facility Procedures : CPT4 Code: [id number] Description: 11042 - DEB SUBQ TISSUE 20 SQ CM/< ICD-10 Diagnosis Description 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 Modifier: without evidence of n Quantity: 1 ecrosis Physician Procedures : CPT4 Code Description Modifier [id number] - WC PHYS LEVEL 4 - EST PT 25 ICD-10 Diagnosis Description 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

## 2022-11-06 NOTE — Progress Notes (Signed)
SHERRON, MAPP ([id number]) [id number] Page 1 of 8 Visit Report for 11/06/2022 Arrival Information Details Patient Name: Date of Service: [name], ULREY 11/06/2022 9:45 A M Medical Record Number: [id number] Patient Account Number: [id number] Date of Birth/Sex: Treating RN: 1946/08/13 (76 y.o. [name], [name] Primary Care Nalee Lightle: [name] Other Clinician: Referring Deontez Klinke: Treating Meshell Abdulaziz/Extender: [name] in Treatment: 6 Visit Information History Since Last Visit Added or deleted any medications: No Patient Arrived: Wheel Chair Any new allergies or adverse reactions: No Arrival Time: 09:57 Had a fall or experienced change in No Accompanied By: Husband activities of daily living that may affect Transfer Assistance: EasyPivot Patient Lift risk of falls: Patient Requires Transmission-Based Precautions: No Signs or symptoms of abuse/neglect since last visito No Patient Has Alerts: No Hospitalized since last visit: No Implantable device outside of the clinic excluding No cellular tissue based products placed in the center since last visit: Has Dressing in Place as Prescribed: Yes Pain Present Now: No Electronic Signature(s) Signed: 11/06/2022 5:04:49 PM By: Blanche East RN Entered By: Blanche East on 11/06/2022 09:57:27 -------------------------------------------------------------------------------- Encounter Discharge Information Details Patient Name: Date of Service: Sherry Masson [name] 11/06/2022 9:45 A M Medical Record Number: [id number] Patient Account Number: [id number] Date of Birth/Sex: Treating RN: 1946-06-14 (76 y.o. Marta Lamas Primary Care Lashonda Sonneborn: [name] Other Clinician: Referring Lourie Retz: Treating Ernst Cumpston/Extender: [name] in Treatment: 6 Encounter Discharge Information Items Post Procedure Vitals Discharge Condition:  Stable Temperature (F): 97.6 Ambulatory Status: Wheelchair Pulse (bpm): 78 Discharge Destination: Home Respiratory Rate (breaths/min): 16 Transportation: Private Auto Blood Pressure (mmHg): 111/74 Accompanied By: husband Schedule Follow-up Appointment: Yes Clinical Summary of Care: Electronic Signature(s) Signed: 11/06/2022 5:04:49 PM By: Blanche East RN Entered By: Blanche East on 11/06/2022 10:41:19 [name] ([id number]) [id number] Page 2 of 8 -------------------------------------------------------------------------------- Lower Extremity Assessment Details Patient Name: Date of Service: [name], WINFREE 11/06/2022 9:45 A M Medical Record Number: [id number] Patient Account Number: [id number] Date of Birth/Sex: Treating RN: May 16, 1946 (76 y.o. Marta Lamas Primary Care Erina Hamme: [name] Other Clinician: Referring Raneisha Bress: Treating Zelia Yzaguirre/Extender: [name] in Treatment: 6 Edema Assessment Assessed: [Left: No] [Right: No] [Left: Edema] [Right: :] Calf Left: Right: Point of Measurement: From Medial Instep 30 cm Ankle Left: Right: Point of Measurement: From Medial Instep 19.5 cm Vascular Assessment Pulses: Dorsalis Pedis Palpable: [Right:Yes] Electronic Signature(s) Signed: 11/06/2022 5:04:49 PM By: Blanche East RN Entered By: Blanche East on 11/06/2022 09:58:04 -------------------------------------------------------------------------------- Multi Wound Chart Details Patient Name: Date of Service: Sherry Masson [name] 11/06/2022 9:45 A M Medical Record Number: [id number] Patient Account Number: [id number] Date of Birth/Sex: Treating RN: June 15, 1946 (76 y.o. F) Primary Care Saraiya Kozma: [name] Other Clinician: Referring Wei Newbrough: Treating Davene Jobin/Extender: [name] in Treatment: 6 Vital Signs Height(in): 64 Pulse(bpm): 41 Weight(lbs):  111 Blood Pressure(mmHg): 111/74 Body Mass Index(BMI): 19.1 Temperature(F): 97.6 Respiratory Rate(breaths/min): 16 [1:Photos:] [N/A:N/A] Right, Dorsal Foot N/A N/A Wound Location: Blister N/A N/A Wounding Event: Lymphedema N/A N/A Primary Etiology: Asthma, Chronic Obstructive N/A N/A Comorbid History: Pulmonary Disease (COPD), Congestive Heart Failure, Hypertension, Rheumatoid Arthritis, Neuropathy, Received Chemotherapy 08/23/2022 N/A N/A Date Acquired: 6 N/A N/A Weeks of Treatment: Open N/A N/A Wound Status: No N/A N/A Wound Recurrence: 1.1x1x0.2 N/A N/A Measurements L x W x D (cm) 0.864 N/A N/A A (cm) : rea 0.173 N/A N/A Volume (cm) : 16.70% N/A N/A % Reduction in A rea: 72.20% N/A  N/A % Reduction in Volume: 3 Position 1 (o'clock): 0.8 Maximum Distance 1 (cm): 10 Starting Position 1 (o'clock): 2 Ending Position 1 (o'clock): 0.5 Maximum Distance 1 (cm): Yes N/A N/A Tunneling: Yes N/A N/A Undermining: Full Thickness Without Exposed N/A N/A Classification: Support Structures Medium N/A N/A Exudate A mount: Serosanguineous N/A N/A Exudate Type: red, brown N/A N/A Exudate Color: Distinct, outline attached N/A N/A Wound Margin: Medium (34-66%) N/A N/A Granulation A mount: Red N/A N/A Granulation Quality: Medium (34-66%) N/A N/A Necrotic A mount: Fat Layer (Subcutaneous Tissue): Yes N/A N/A Exposed Structures: Fascia: No Tendon: No Muscle: No Joint: No Bone: No None N/A N/A Epithelialization: Debridement - Excisional N/A N/A Debridement: Pre-procedure Verification/Time Out 10:15 N/A N/A Taken: Lidocaine 5% topical ointment N/A N/A Pain Control: Subcutaneous, Slough N/A N/A Tissue Debrided: Skin/Subcutaneous Tissue N/A N/A Level: 1.1 N/A N/A Debridement A (sq cm): rea Curette N/A N/A Instrument: Minimum N/A N/A Bleeding: Pressure N/A N/A Hemostasis A chieved: 0 N/A N/A Procedural Pain: 0 N/A N/A Post Procedural  Pain: Procedure was tolerated well N/A N/A Debridement Treatment Response: 1.1x1x0.2 N/A N/A Post Debridement Measurements L x W x D (cm) 0.173 N/A N/A Post Debridement Volume: (cm) No Abnormalities Noted N/A N/A Periwound Skin Texture: No Abnormalities Noted N/A N/A Periwound Skin Moisture: Erythema: Yes N/A N/A Periwound Skin Color: No Abnormality N/A N/A Temperature: Debridement N/A N/A Procedures Performed: Treatment Notes Electronic Signature(s) Signed: 11/06/2022 10:28:08 AM By: Fredirick Maudlin MD FACS Previous Signature: 11/06/2022 10:26:23 AM Version By: Fredirick Maudlin MD FACS Entered By: Fredirick Maudlin on 11/06/2022 10:28:08 -------------------------------------------------------------------------------- Multi-Disciplinary Care Plan Details Patient Name: Date of Service: Sherry Masson [name] 11/06/2022 9:45 A M Medical Record Number: [id number] Patient Account Number: [id number] JOANIE, DUPREY H ([id number]) [id number] Page 4 of 8 Date of Birth/Sex: Treating RN: 1946-03-08 (76 y.o. Marta Lamas Primary Care Heath Tesler: Other Clinician: [name] Referring Sakiya Stepka: Treating Adyn Serna/Extender: [name] in Treatment: 6 Active Inactive Peripheral Neuropathy Nursing Diagnoses: Potential alteration in peripheral tissue perfusion (select prior to confirmation of diagnosis) Goals: Patient/caregiver will verbalize understanding of disease process and disease management Date Initiated: 09/20/2022 Target Resolution Date: 11/24/2022 Goal Status: Active Interventions: Provide education on Management of Neuropathy and Related Ulcers Treatment Activities: Patient referred for customized footwear/offloading : 09/20/2022 Notes: Wound/Skin Impairment Nursing Diagnoses: Knowledge deficit related to ulceration/compromised skin integrity Goals: Ulcer/skin breakdown will have a volume reduction of 30% by  week 4 Date Initiated: 09/20/2022 Target Resolution Date: 11/24/2022 Goal Status: Active Interventions: Assess ulceration(s) every visit Treatment Activities: Skin care regimen initiated : 09/20/2022 Topical wound management initiated : 09/20/2022 Notes: Electronic Signature(s) Signed: 11/06/2022 5:04:49 PM By: Blanche East RN Entered By: Blanche East on 11/06/2022 10:03:18 -------------------------------------------------------------------------------- Negative Pressure Wound Therapy Maintenance (NPWT) Details Patient Name: Date of Service: [name], MANDILE 11/06/2022 9:45 A M Medical Record Number: [id number] Patient Account Number: [id number] Date of Birth/Sex: Treating RN: Feb 11, 1946 (76 y.o. Marta Lamas Primary Care Nell Schrack: [name] Other Clinician: Referring Farrell Broerman: Treating Rivers Gassmann/Extender: [name] in Treatment: 6 NPWT Maintenance Performed for: Wound #1 Right, Dorsal Foot Performed By: Blanche East, RN Type: VAC System Coverage Size (sq cm): 1.1 Pressure Type: Constant Pressure Setting: 125 mmHG Drain Type: Channel Primary Contact: Other : prisma Sponge/Dressing Type: Foam, Blue Date Initiated: 10/13/2022 Dressing Removed: No Lambing, Zakyah H ([id number]) [id number] Page 5 of 8 Quantity of Sponges/Gauze Removed: 1 Canister Changed: No Canister Exudate Volume: 10 Dressing Reapplied: No Quantity of  Sponges/Gauze Inserted: 2 Respones T Treatment: o tolerated well Days On NPWT : 25 Post Procedure Diagnosis Same as Pre-procedure Electronic Signature(s) Signed: 11/06/2022 5:04:49 PM By: Blanche East RN Entered By: Blanche East on 11/06/2022 10:31:04 -------------------------------------------------------------------------------- Pain Assessment Details Patient Name: Date of Service: Sherry Masson [name] 11/06/2022 9:45 A M Medical Record Number: [id number] Patient Account Number:  [id number] Date of Birth/Sex: Treating RN: April 15, 1946 (76 y.o. Marta Lamas Primary Care Ekaterina Denise: [name] Other Clinician: Referring Abanoub Hanken: Treating Twinkle Sockwell/Extender: [name] in Treatment: 6 Active Problems Location of Pain Severity and Description of Pain Patient Has Paino No Site Locations Pain Management and Medication Current Pain Management: Electronic Signature(s) Signed: 11/06/2022 5:04:49 PM By: Blanche East RN Entered By: Blanche East on 11/06/2022 09:57:53 -------------------------------------------------------------------------------- Patient/Caregiver Education Details Patient Name: Date of Service: [name] Dust 11/13/2023andnbsp9:45 [name] JIA, [name] ([id number]) [id number] Page 6 of 8 Medical Record Number: [id number] Patient Account Number: [id number] Date of Birth/Gender: Treating RN: 11/21/1946 (76 y.o. Marta Lamas Primary Care Physician: [name] Other Clinician: Referring Physician: Treating Physician/Extender: [name] in Treatment: 6 Education Assessment Education Provided To: Patient Education Topics Provided Peripheral Neuropathy: Methods: Explain/Verbal Responses: Reinforcements needed, State content correctly Electronic Signature(s) Signed: 11/06/2022 5:04:49 PM By: Blanche East RN Entered By: Blanche East on 11/06/2022 10:03:30 -------------------------------------------------------------------------------- Wound Assessment Details Patient Name: Date of Service: Sherry Masson [name] 11/06/2022 9:45 A M Medical Record Number: [id number] Patient Account Number: [id number] Date of Birth/Sex: Treating RN: 1946-11-02 (76 y.o. [name], [name] Primary Care Delmer Kowalski: [name] Other Clinician: Referring Asheley Hellberg: Treating Tevin Shillingford/Extender: [name] in Treatment: 6 Wound Status Wound  Number: 1 Primary Lymphedema Etiology: Wound Location: Right, Dorsal Foot Wound Open Wounding Event: Blister Status: Date Acquired: 08/23/2022 Comorbid Asthma, Chronic Obstructive Pulmonary Disease (COPD), Weeks Of Treatment: 6 History: Congestive Heart Failure, Hypertension, Rheumatoid Arthritis, Clustered Wound: No Neuropathy, Received Chemotherapy Photos Wound Measurements Length: (cm) 1.1 Width: (cm) 1 Depth: (cm) 0.2 Area: (cm) 0.864 Volume: (cm) 0.173 % Reduction in Area: 16.7% % Reduction in Volume: 72.2% Epithelialization: None Tunneling: Yes Position (o'clock): 3 Maximum Distance: (cm) 0.8 Undermining: Yes Starting Position (o'clock): 10 Ending Position (o'clock): 2 Maximum Distance: (cm) 0.5 Vester, Lacretia H ([id number]) [id number] Page 7 of 8 Wound Description Classification: Full Thickness Without Exposed Support Structures Wound Margin: Distinct, outline attached Exudate Amount: Medium Exudate Type: Serosanguineous Exudate Color: red, brown Foul Odor After Cleansing: No Slough/Fibrino Yes Wound Bed Granulation Amount: Medium (34-66%) Exposed Structure Granulation Quality: Red Fascia Exposed: No Necrotic Amount: Medium (34-66%) Fat Layer (Subcutaneous Tissue) Exposed: Yes Necrotic Quality: Adherent Slough Tendon Exposed: No Muscle Exposed: No Joint Exposed: No Bone Exposed: No Periwound Skin Texture Texture Color No Abnormalities Noted: Yes No Abnormalities Noted: No Erythema: Yes Moisture No Abnormalities Noted: Yes Temperature / Pain Temperature: No Abnormality Treatment Notes Wound #1 (Foot) Wound Laterality: Dorsal, Right Cleanser Peri-Wound Care Topical Primary Dressing Secondary Dressing Secured With Compression Wrap Compression Stockings Add-Ons Electronic Signature(s) Signed: 11/06/2022 5:04:49 PM By: Blanche East RN Entered By: Blanche East on 11/06/2022  10:04:52 -------------------------------------------------------------------------------- Vitals Details Patient Name: Date of Service: Sherry Masson [name] 11/06/2022 9:45 A M Medical Record Number: [id number] Patient Account Number: [id number] Date of Birth/Sex: Treating RN: September 06, 1946 (76 y.o. Marta Lamas Primary Care Asad Keeven: [name] Other Clinician: Referring Jowell Bossi: Treating Keven Soucy/Extender: [name] in Treatment: 6 Vital Signs Time Taken: 09:55 Temperature (F):  97.6 Height (in): 64 Pulse (bpm): 78 Weight (lbs): 111 Respiratory Rate (breaths/min): 16 Body Mass Index (BMI): 19.1 Blood Pressure (mmHg): 111/74 Reference Range: 80 - 120 mg / dl Gaccione, Dream H ([id number]) [id number] Page 8 of 8 Electronic Signature(s) Signed: 11/06/2022 5:04:49 PM By: Blanche East RN Entered By: Blanche East on 11/06/2022 09:57:47

## 2022-11-11 IMAGING — MR MR LUMBAR SPINE WO/W CM
6 of 8 series · 29 of 48 positions shown · IV contrast (5 ML GDAVIST)
Comparison: Lumbar MRI 06/28/2021. CT-guided bone marrow biopsy
04/04/2022.

CLINICAL DATA: 75-year-old female with bilateral lower extremity
weakness. Chronic leukemia. Presacral mass. Recent CT-guided right
ilium bone marrow aspiration.

EXAM:
MRI LUMBAR SPINE WITHOUT AND WITH CONTRAST
TECHNIQUE: Multiplanar and multiecho pulse sequences of the lumbar spine were
obtained without and with intravenous contrast.
CONTRAST:  5mL GADAVIST GADOBUTROL 1 MMOL/ML IV SOLN

[Series 9: T1 · sagittal · 4.0mm · 0.81mm/px · 3 of 20 slices shown (1 of 2)]
[im 1/20]
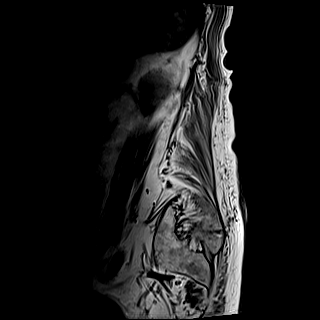
[im 10/20]
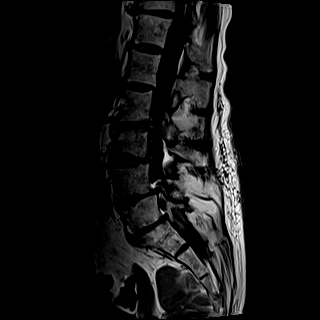
[im 20/20]
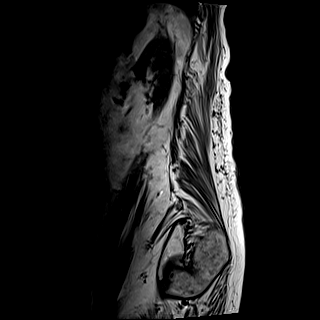

[Series 10: STIR · sagittal · 4.0mm · 0.51mm/px · 2 of 20 slices shown]
[im 1/20]
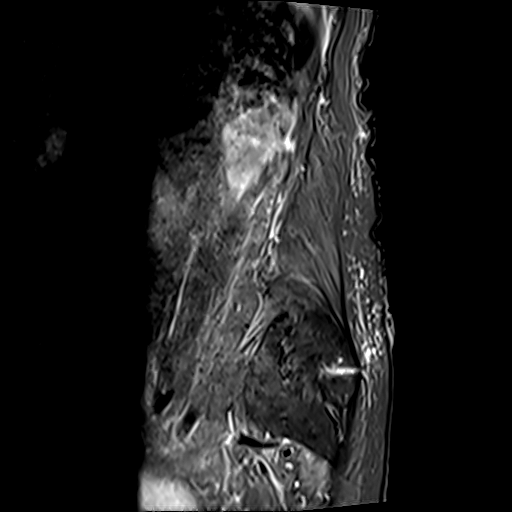
[im 7/20]
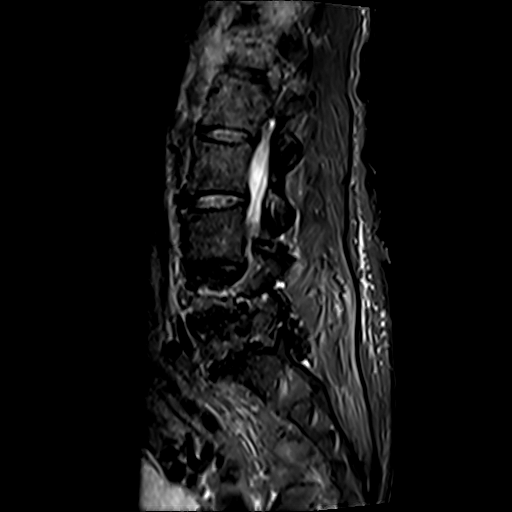

[Series 11: T2 · axial · 4.0mm · 0.62mm/px · z∈[-5,+199]mm · 8 of 42 slices shown]
[im 1/42]
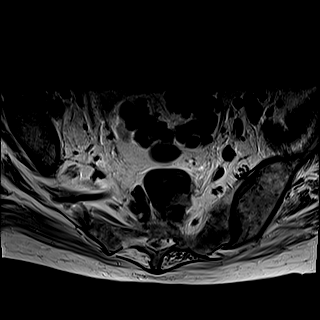
[im 6/42]
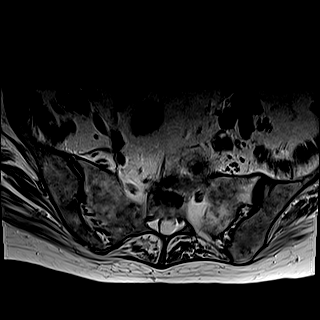
[im 12/42]
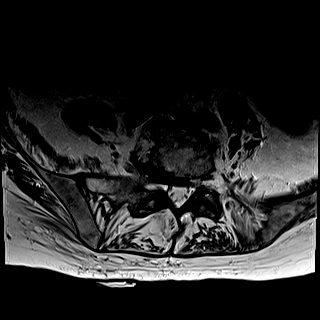
[im 18/42]
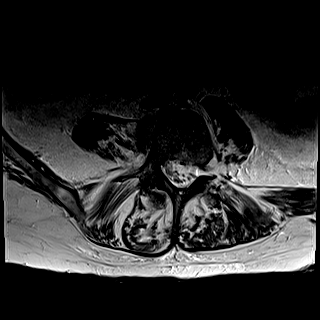
[im 24/42]
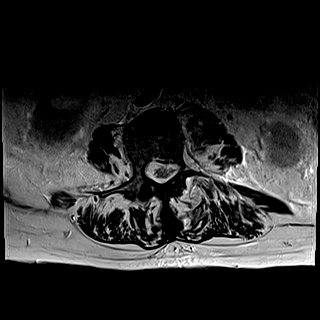
[im 30/42]
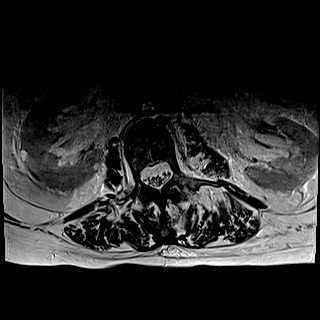
[im 36/42]
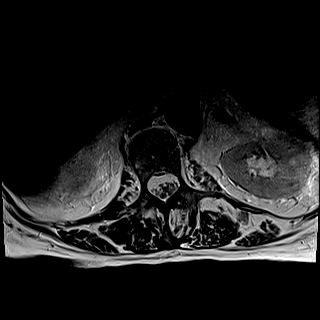
[im 42/42]
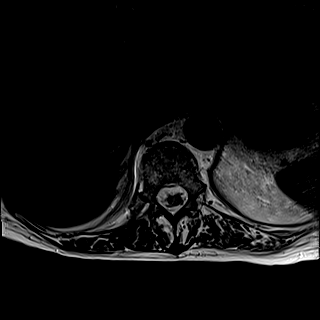

[Series 12: T1 · axial · 4.0mm · 0.39mm/px · z∈[-5,+199]mm · 8 of 42 slices shown (2 of 2)]
[im 1/42]
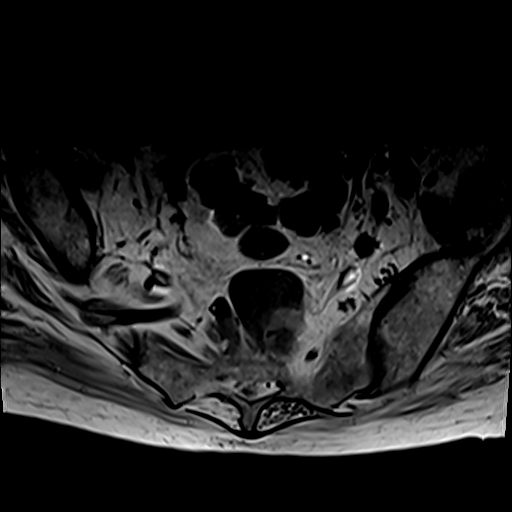
[im 6/42]
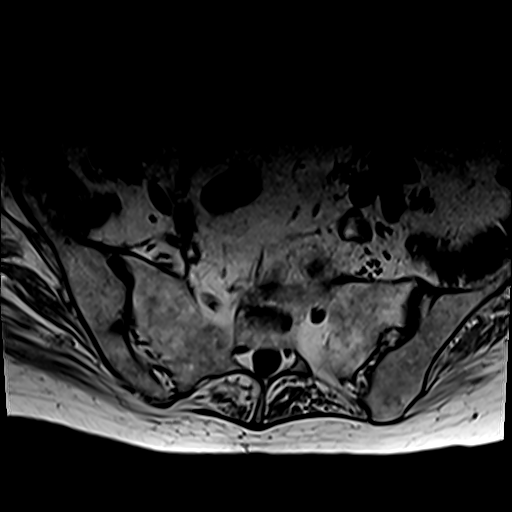
[im 12/42]
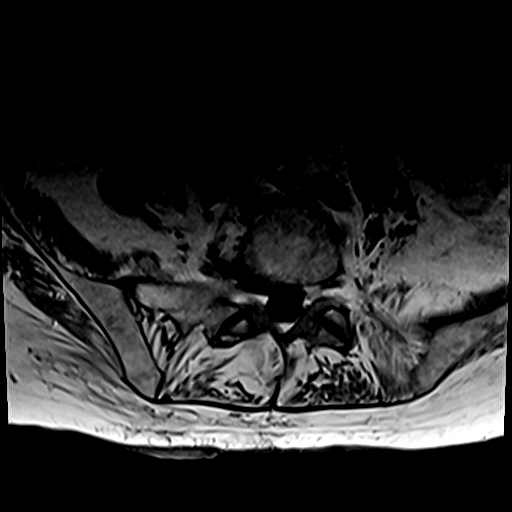
[im 18/42]
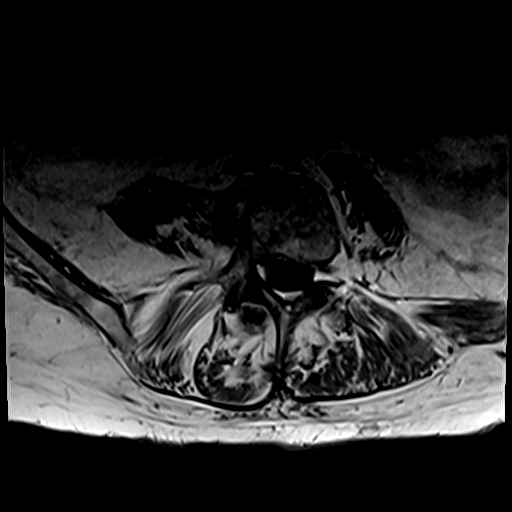
[im 24/42]
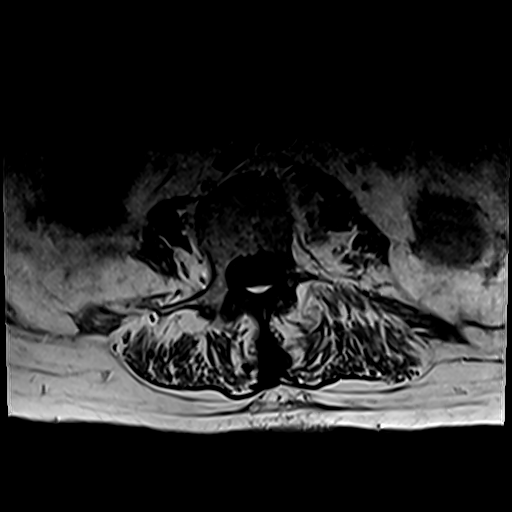
[im 30/42]
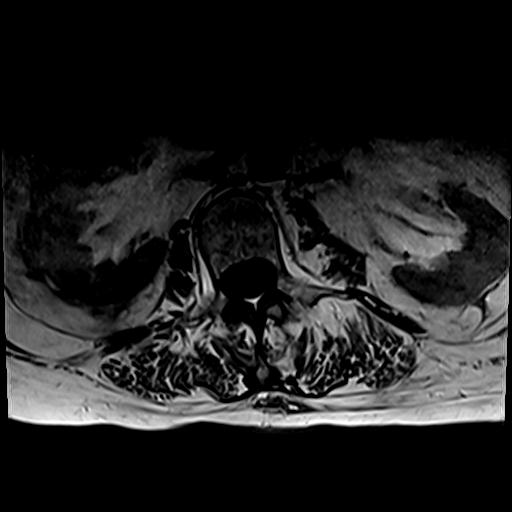
[im 36/42]
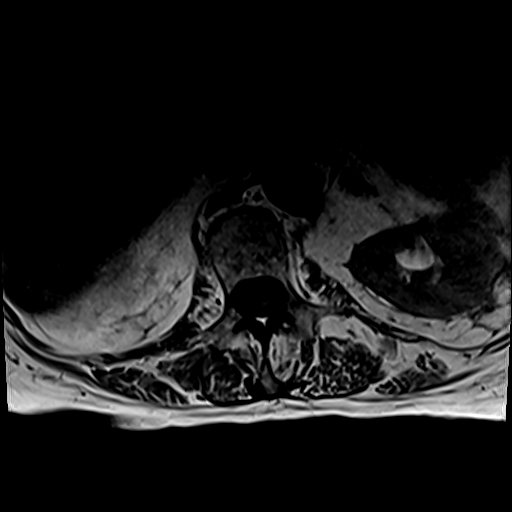
[im 42/42]
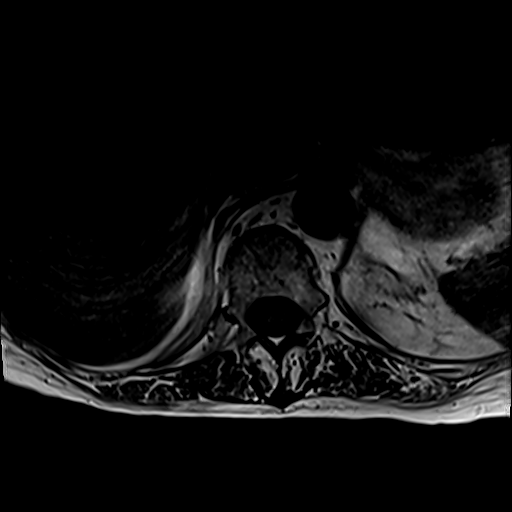

[Series 13: T2 post-contrast · sagittal · 4.0mm · 0.81mm/px · 4 of 20 slices shown]
[im 1/20]
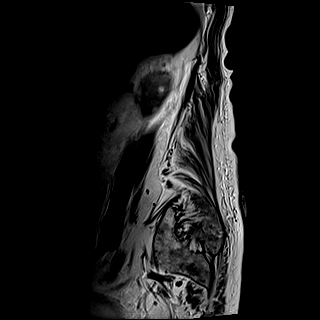
[im 7/20]
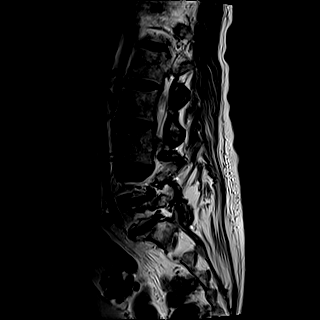
[im 13/20]
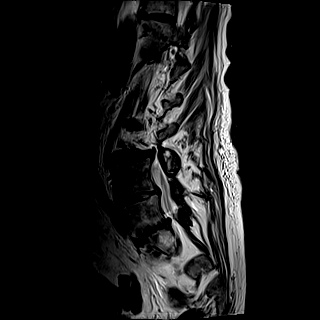
[im 20/20]
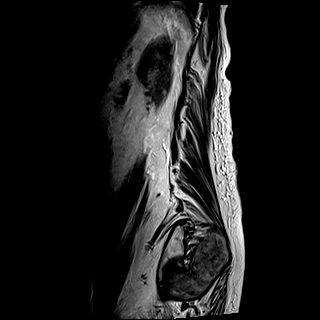

[Series 14: T1 fat-sat post-contrast · sagittal · 4.0mm · 0.81mm/px · 4 of 20 slices shown]
[im 1/20]
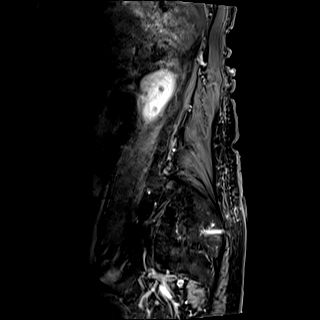
[im 7/20]
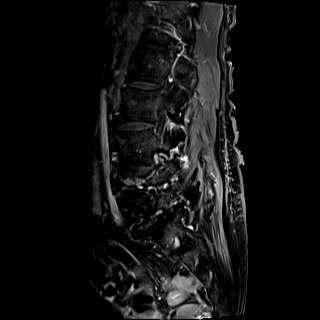
[im 13/20]
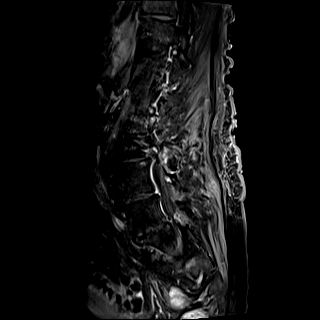
[im 20/20]
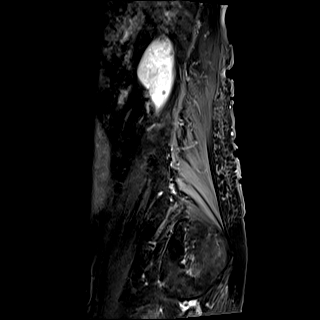

[29 of 48 positions shown; findings below may reference images not displayed]

FINDINGS: Segmentation: Lumbar segmentation appears to be normal and is the
same numbering system used on the MRI last year.

Alignment: Chronic dextroconvex lumbar scoliosis. Stable lumbar
lordosis from last year.

Vertebrae: Degenerative endplate changes but no suspicious or
enhancing marrow lesion identified in the visible lower thoracic or
lumbar spine. Visible sacrum bone marrow signal also appears to
remain normal, although there is a partially visible lobulated and
enhancing at least 5.5 cm presacral soft tissue mass abutting the
ventral sacral cortex (series 14 image 10).

Linear edema and enhancement of the right ilium from recent bone
marrow aspiration (series 14, image 20).

Conus medullaris and cauda equina: Conus extends to the L1 level. No
lower spinal cord or conus signal abnormality. There is no visible
lower thoracic spinal stenosis. No abnormal intradural enhancement
or dural thickening identified.

Paraspinal and other soft tissues: Negative visible paraspinal soft
tissues outside of the presacral mass described above.
Diverticulosis of large bowel in the pelvis.

Disc levels:

Advanced lumbar spine degeneration and multifactorial spinal,
lateral recess, and foraminal stenosis at L2-L3 and L3-L4 has not
significantly changed from last year, in part related to chronic
left caudal disc extrusion at L2-L3 (series 11, image 17). Similar
stable chronic right lateral recess stenosis and right foraminal
stenosis at L4-L5.
IMPRESSION: 1. No metastatic disease identified in the lower thoracic or lumbar
spine. Chronic presacral soft tissue tumor, but the visible sacral
bone marrow signal remains normal.

2. Normal visible lower thoracic spinal cord at T11 and T12. Normal
conus at L1.

3. Advanced chronic lumbar spine degeneration not significantly
changed from last year, with chronic disc herniation contributing to
spinal, lateral recess, and foraminal stenosis at L2-L3, L3-L4, and
L4-L5.

## 2022-11-13 ENCOUNTER — Encounter (HOSPITAL_BASED_OUTPATIENT_CLINIC_OR_DEPARTMENT_OTHER): Payer: Medicare Other | Admitting: General Surgery

## 2022-11-13 DIAGNOSIS — M069 Rheumatoid arthritis, unspecified: Secondary | ICD-10-CM | POA: Diagnosis not present

## 2022-11-13 DIAGNOSIS — L97812 Non-pressure chronic ulcer of other part of right lower leg with fat layer exposed: Secondary | ICD-10-CM | POA: Diagnosis not present

## 2022-11-13 DIAGNOSIS — L97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 evidence of necrosis: Secondary | ICD-10-CM | POA: Diagnosis not present

## 2022-11-13 DIAGNOSIS — E11621 Type 2 diabetes mellitus with foot ulcer: Secondary | ICD-10-CM | POA: Diagnosis not present

## 2022-11-13 DIAGNOSIS — I890 Lymphedema, not elsewhere classified: Secondary | ICD-10-CM | POA: Diagnosis not present

## 2022-11-13 DIAGNOSIS — L97512 Non-pressure chronic ulcer of other part of right foot with fat layer exposed: Secondary | ICD-10-CM | POA: Diagnosis not present

## 2022-11-13 DIAGNOSIS — Z7952 Long term (current) use of systemic steroids: Secondary | ICD-10-CM | POA: Diagnosis not present

## 2022-11-13 DIAGNOSIS — I5022 Chronic systolic (congestive) heart failure: Secondary | ICD-10-CM | POA: Diagnosis not present

## 2022-11-13 NOTE — Progress Notes (Signed)
Sherry, [name] ([id number]) [id number] Page 1 of 7 Visit Report for 11/13/2022 Arrival Information Details Patient Name: Date of Service: [name], [name] 11/13/2022 2:30 PM Medical Record Number: [id number] Patient Account Number: [id number] Date of Birth/Sex: Treating RN: 08/17/1946 (76 y.o. [name], [name] Primary Care Irwin Toran: [name] Other Clinician: Referring Tawania Daponte: Treating Liset Mcmonigle/Extender: [name] in Treatment: 7 Visit Information History Since Last Visit Added or deleted any medications: No Patient Arrived: Wheel Chair Any new allergies or adverse reactions: No Arrival Time: 14:55 Had a fall or experienced change in No Accompanied By: husband activities of daily living that may affect Transfer Assistance: None risk of falls: Patient Identification Verified: Yes Signs or symptoms of abuse/neglect since last visito No Secondary Verification Process Completed: Yes Hospitalized since last visit: No Patient Requires Transmission-Based Precautions: No Implantable device outside of the clinic excluding No Patient Has Alerts: No cellular tissue based products placed in the center since last visit: Has Dressing in Place as Prescribed: Yes Pain Present Now: No Electronic Signature(s) Signed: 11/13/2022 4:43:27 PM By: Blanche East RN Entered By: Blanche East on 11/13/2022 14:55:45 -------------------------------------------------------------------------------- Encounter Discharge Information Details Patient Name: Date of Service: [name] [name] 11/13/2022 2:30 PM Medical Record Number: [id number] Patient Account Number: [id number] Date of Birth/Sex: Treating RN: [birth year] (76 y.o. Marta Lamas Primary Care Madesyn Ast: [name] Other Clinician: Referring Desma Wilkowski: Treating Taaliyah Delpriore/Extender: [name] in Treatment: 7 Encounter Discharge Information  Items Post Procedure Vitals Discharge Condition: Stable Temperature (F): 97.8 Ambulatory Status: Wheelchair Pulse (bpm): 73 Discharge Destination: Home Respiratory Rate (breaths/min): 16 Transportation: Private Auto Blood Pressure (mmHg): 106/57 Accompanied By: self Schedule Follow-up Appointment: Yes Clinical Summary of Care: Electronic Signature(s) Signed: 11/13/2022 4:43:27 PM By: Blanche East RN Entered By: Blanche East on 11/13/2022 16:38:16 Butler, [name] ([id number]) [id number] Page 2 of 7 -------------------------------------------------------------------------------- Lower Extremity Assessment Details Patient Name: Date of Service: Sherry, [name] 11/13/2022 2:30 PM Medical Record Number: [id number] Patient Account Number: [id number] Date of Birth/Sex: Treating RN: 22-Apr-1946 (76 y.o. Marta Lamas Primary Care Mechel Haggard: [name] Other Clinician: Referring Jeslie Lowe: Treating Denice Cardon/Extender: [name] in Treatment: 7 Edema Assessment Assessed: [Left: No] [Right: No] [Left: Edema] [Right: :] Calf Left: Right: Point of Measurement: From Medial Instep 27 cm Ankle Left: Right: Point of Measurement: From Medial Instep 21 cm Vascular Assessment Pulses: Dorsalis Pedis Palpable: [Right:Yes] Electronic Signature(s) Signed: 11/13/2022 4:43:27 PM By: Blanche East RN Entered By: Blanche East on 11/13/2022 14:56:22 -------------------------------------------------------------------------------- Multi Wound Chart Details Patient Name: Date of Service: [name] [name] 11/13/2022 2:30 PM Medical Record Number: [id number] Patient Account Number: [id number] Date of Birth/Sex: Treating RN: Jan 04, 1946 (76 y.o. F) Primary Care Allisen Pidgeon: [name] Other Clinician: Referring Alexey Rhoads: Treating Fredi Hurtado/Extender: [name] in Treatment: 7 Vital  Signs Height(in): 63 Pulse(bpm): 4 Weight(lbs): 111 Blood Pressure(mmHg): 106/57 Body Mass Index(BMI): 19.1 Temperature(F): 97.8 Respiratory Rate(breaths/min): 16 [1:Photos:] [N/A:N/A] Right, Dorsal Foot N/A N/A Wound Location: Blister N/A N/A Wounding Event: Lymphedema N/A N/A Primary Etiology: Asthma, Chronic Obstructive N/A N/A Comorbid History: Pulmonary Disease (COPD), Congestive Heart Failure, Hypertension, Rheumatoid Arthritis, Neuropathy, Received Chemotherapy 08/23/2022 N/A N/A Date Acquired: 7 N/A N/A Weeks of Treatment: Open N/A N/A Wound Status: No N/A N/A Wound Recurrence: 1x1x0.2 N/A N/A Measurements L x W x D (cm) 0.785 N/A N/A A (cm) : rea 0.157 N/A N/A Volume (cm) : 24.30% N/A N/A % Reduction in A  rea: 74.80% N/A N/A % Reduction in Volume: 12 Starting Position 1 (o'clock): 12 Ending Position 1 (o'clock): 0.4 Maximum Distance 1 (cm): Yes N/A N/A Undermining: Full Thickness Without Exposed N/A N/A Classification: Support Structures Medium N/A N/A Exudate A mount: Serosanguineous N/A N/A Exudate Type: red, brown N/A N/A Exudate Color: Distinct, outline attached N/A N/A Wound Margin: Medium (34-66%) N/A N/A Granulation A mount: Red N/A N/A Granulation Quality: Medium (34-66%) N/A N/A Necrotic A mount: Fat Layer (Subcutaneous Tissue): Yes N/A N/A Exposed Structures: Fascia: No Tendon: No Muscle: No Joint: No Bone: No None N/A N/A Epithelialization: Debridement - Excisional N/A N/A Debridement: Pre-procedure Verification/Time Out 15:03 N/A N/A Taken: Subcutaneous, Slough N/A N/A Tissue Debrided: Skin/Subcutaneous Tissue N/A N/A Level: 1 N/A N/A Debridement A (sq cm): rea Curette N/A N/A Instrument: Minimum N/A N/A Bleeding: Pressure N/A N/A Hemostasis A chieved: Procedure was tolerated well N/A N/A Debridement Treatment Response: 1x1x0.2 N/A N/A Post Debridement Measurements L x W x D (cm) 0.157 N/A  N/A Post Debridement Volume: (cm) No Abnormalities Noted N/A N/A Periwound Skin Texture: No Abnormalities Noted N/A N/A Periwound Skin Moisture: Erythema: Yes N/A N/A Periwound Skin Color: No Abnormality N/A N/A Temperature: Debridement N/A N/A Procedures Performed: Treatment Notes Electronic Signature(s) Signed: 11/13/2022 3:30:00 PM By: Fredirick Maudlin MD FACS Entered By: Fredirick Maudlin on 11/13/2022 15:30:00 -------------------------------------------------------------------------------- Multi-Disciplinary Care Plan Details Patient Name: Date of Service: [name] [name] 11/13/2022 2:30 PM Medical Record Number: [id number] Patient Account Number: [id number] Date of Birth/Sex: Treating RN: 1946/01/02 (76 y.o. Marta Lamas Primary Care Joeann Steppe: [name] Other Clinician: Referring Katalena Malveaux: Treating Cayetano Mikita/Extender: [name] in Treatment: [address], [place] ([id number]) [id number] Page 4 of 7 Active Inactive Peripheral Neuropathy Nursing Diagnoses: Potential alteration in peripheral tissue perfusion (select prior to confirmation of diagnosis) Goals: Patient/caregiver will verbalize understanding of disease process and disease management Date Initiated: 09/20/2022 Target Resolution Date: 11/24/2022 Goal Status: Active Interventions: Provide education on Management of Neuropathy and Related Ulcers Treatment Activities: Patient referred for customized footwear/offloading : 09/20/2022 Notes: Wound/Skin Impairment Nursing Diagnoses: Knowledge deficit related to ulceration/compromised skin integrity Goals: Ulcer/skin breakdown will have a volume reduction of 30% by week 4 Date Initiated: 09/20/2022 Target Resolution Date: 11/24/2022 Goal Status: Active Interventions: Assess ulceration(s) every visit Treatment Activities: Skin care regimen initiated : 09/20/2022 Topical wound management initiated :  09/20/2022 Notes: Electronic Signature(s) Signed: 11/13/2022 4:43:27 PM By: Blanche East RN Entered By: Blanche East on 11/13/2022 14:57:16 -------------------------------------------------------------------------------- Pain Assessment Details Patient Name: Date of Service: KARYNA, BESSLER [name] 11/13/2022 2:30 PM Medical Record Number: [id number] Patient Account Number: [id number] Date of Birth/Sex: Treating RN: Sep 16, 1946 (76 y.o. Marta Lamas Primary Care Vitali Seibert: [name] Other Clinician: Referring Gwendelyn Lanting: Treating Lakesia Dahle/Extender: [name] in Treatment: 7 Active Problems Location of Pain Severity and Description of Pain Patient Has Paino No Site Locations Rate the pain. TAHIRA, OLIVAREZ ([id number]) [id number] Page 5 of 7 Rate the pain. Current Pain Level: 0 Pain Management and Medication Current Pain Management: Electronic Signature(s) Signed: 11/13/2022 4:43:27 PM By: Blanche East RN Entered By: Blanche East on 11/13/2022 14:56:13 -------------------------------------------------------------------------------- Patient/Caregiver Education Details Patient Name: Date of Service: Alroy Dust 11/20/2023andnbsp2:30 PM Medical Record Number: [id number] Patient Account Number: [id number] Date of Birth/Gender: Treating RN: [birth year] (76 y.o. Marta Lamas Primary Care Physician: [name] Other Clinician: Referring Physician: Treating Physician/Extender: [name] in Treatment: 7 Education Assessment Education Provided To: Patient  Education Topics Provided Peripheral Neuropathy: Methods: Explain/Verbal Responses: Reinforcements needed, State content correctly Electronic Signature(s) Signed: 11/13/2022 4:43:27 PM By: Blanche East RN Entered By: Blanche East on 11/13/2022  14:57:32 -------------------------------------------------------------------------------- Wound Assessment Details Patient Name: Date of Service: TAKIYA, BELMARES [name] 11/13/2022 2:30 PM Medical Record Number: [id number] Patient Account Number: [id number] Date of Birth/Sex: Treating RN: [birth year] (76 y.o. Marta Lamas Primary Care Dania Marsan: [name] Other Clinician: Referring Antuan Limes: Treating Shakai Dolley/Extender: Veneta Penton King, [place] ([id number]) [id number] Page 6 of 7 Weeks in Treatment: 7 Wound Status Wound Number: 1 Primary Lymphedema Etiology: Wound Location: Right, Dorsal Foot Wound Open Wounding Event: Blister Status: Date Acquired: 08/23/2022 Comorbid Asthma, Chronic Obstructive Pulmonary Disease (COPD), Weeks Of Treatment: 7 History: Congestive Heart Failure, Hypertension, Rheumatoid Arthritis, Clustered Wound: No Neuropathy, Received Chemotherapy Photos Wound Measurements Length: (cm) 1 Width: (cm) 1 Depth: (cm) 0.2 Area: (cm) 0.785 Volume: (cm) 0.157 % Reduction in Area: 24.3% % Reduction in Volume: 74.8% Epithelialization: None Tunneling: No Undermining: Yes Starting Position (o'clock): 12 Ending Position (o'clock): 12 Maximum Distance: (cm) 0.4 Wound Description Classification: Full Thickness Without Exposed Support Structures Wound Margin: Distinct, outline attached Exudate Amount: Medium Exudate Type: Serosanguineous Exudate Color: red, brown Foul Odor After Cleansing: No Slough/Fibrino Yes Wound Bed Granulation Amount: Medium (34-66%) Exposed Structure Granulation Quality: Red Fascia Exposed: No Necrotic Amount: Medium (34-66%) Fat Layer (Subcutaneous Tissue) Exposed: Yes Necrotic Quality: Adherent Slough Tendon Exposed: No Muscle Exposed: No Joint Exposed: No Bone Exposed: No Periwound Skin Texture Texture Color No Abnormalities Noted: Yes No Abnormalities Noted:  No Erythema: Yes Moisture No Abnormalities Noted: Yes Temperature / Pain Temperature: No Abnormality Treatment Notes Wound #1 (Foot) Wound Laterality: Dorsal, Right Cleanser Soap and Water Discharge Instruction: May shower and wash wound with dial antibacterial soap and water prior to dressing change. Wound Cleanser Discharge Instruction: Cleanse the wound with wound cleanser prior to applying a clean dressing using gauze sponges, not tissue or cotton balls. Peri-Wound Care Topical Skintegrity Hydrogel 4 (oz) Discharge Instruction: Apply hydrogel as directed AAMINA, SKIFF ([id number]) [phone].pdf Page 7 of 7 Primary Dressing Promogran Prisma Matrix, 4.34 (sq in) (silver collagen) Discharge Instruction: Moisten collagen with saline or hydrogel Secondary Dressing Secured With Transpore Surgical Tape, 2x10 (in/yd) Discharge Instruction: Secure dressing with tape as directed. Compression Wrap Kerlix Roll 4.5x3.1 (in/yd) Discharge Instruction: Apply Kerlix and Coban compression as directed. Compression Stockings Add-Ons Electronic Signature(s) Signed: 11/13/2022 4:43:27 PM By: Blanche East RN Entered By: Blanche East on 11/13/2022 14:59:19 -------------------------------------------------------------------------------- [place] Details Patient Name: Date of Service: [name] [name] 11/13/2022 2:30 PM Medical Record Number: [id number] Patient Account Number: [id number] Date of Birth/Sex: Treating RN: 1946-03-17 (76 y.o. [name], [name] Primary Care Aadhya Bustamante: [name] Other Clinician: Referring Catia Todorov: Treating Lincon Sahlin/Extender: [name] in Treatment: 7 Vital Signs Time Taken: 14:55 Temperature (F): 97.8 Height (in): 64 Pulse (bpm): 73 Weight (lbs): 111 Respiratory Rate (breaths/min): 16 Body Mass Index (BMI): 19.1 Blood Pressure (mmHg): 106/57 Reference Range: 80 - 120 mg / dl Electronic  Signature(s) Signed: 11/13/2022 4:43:27 PM By: Blanche East RN Entered By: Blanche East on 11/13/2022 14:56:00

## 2022-11-13 NOTE — Progress Notes (Signed)
TISHIA, MAESTRE ([id number]) [id number] Page 1 of 9 Visit Report for 11/13/2022 Chief Complaint Document Details Patient Name: Date of Service: [name], [name] 11/13/2022 2:30 PM Medical Record Number: [id number] Patient Account Number: [id number] Date of Birth/Sex: Treating RN: 02-15-1946 (76 y.o. F) Primary Care Provider: [name] Other Clinician: Referring Provider: Treating Provider/Extender: [name] in Treatment: 7 Information Obtained from: Patient Chief Complaint Patient seen for complaints of Non-Healing Wounds. Electronic Signature(s) Signed: 11/13/2022 3:30:07 PM By: Fredirick Maudlin MD FACS Entered By: Fredirick Maudlin on 11/13/2022 15:30:07 -------------------------------------------------------------------------------- Debridement Details Patient Name: Date of Service: [name] RO N [name]. 11/13/2022 2:30 PM Medical Record Number: [id number] Patient Account Number: [id number] Date of Birth/Sex: Treating RN: September 04, 1946 (76 y.o. Marta Lamas Primary Care Provider: [name] Other Clinician: Referring Provider: Treating Provider/Extender: [name] in Treatment: 7 Debridement Performed for Assessment: Wound #1 Right,Dorsal Foot Performed By: Physician Fredirick Maudlin, MD Debridement Type: Debridement Level of Consciousness (Pre-procedure): Awake and Alert Pre-procedure Verification/Time Out Yes - 15:03 Taken: Start Time: 15:04 T Area Debrided (L x W): otal 1 (cm) x 1 (cm) = 1 (cm) Tissue and other material debrided: Slough, Subcutaneous, Slough Level: Skin/Subcutaneous Tissue Debridement Description: Excisional Instrument: Curette Bleeding: Minimum Hemostasis Achieved: Pressure Response to Treatment: Procedure was tolerated well Level of Consciousness (Post- Awake and Alert procedure): Post Debridement Measurements of Total Wound Length: (cm)  1 Width: (cm) 1 Depth: (cm) 0.2 Volume: (cm) 0.157 Character of Wound/Ulcer Post Debridement: Requires Further Debridement Post Procedure Diagnosis Same as Pre-procedure Notes [name] ([id number]) [phone].pdf Page 2 of 9 Scribed for Dr. [name] by Blanche East, RN Electronic Signature(s) Signed: 11/13/2022 4:40:34 PM By: Fredirick Maudlin MD FACS Signed: 11/13/2022 4:43:27 PM By: Blanche East RN Entered By: Blanche East on 11/13/2022 15:05:20 -------------------------------------------------------------------------------- HPI Details Patient Name: Date of Service: [name] RO N [name]. 11/13/2022 2:30 PM Medical Record Number: [id number] Patient Account Number: [id number] Date of Birth/Sex: Treating RN: 03-19-1946 (76 y.o. F) Primary Care Provider: [name] Other Clinician: Referring Provider: Treating Provider/Extender: [name] in Treatment: 7 History of Present Illness HPI Description: ADMISSION 09/20/2022 This is a 76 year old woman with a past medical history significant for congestive heart failure, atrial fibrillation, rheumatoid arthritis on long-term steroid treatment, CMML currently receiving chemotherapy and moderate malnutrition. She presents to clinic today with a wound on the dorsum of her right foot. She says it started out as a bruise and then developed a bump which subsequently broke open. She says she was sent to our clinic by her oncologist. She is not diabetic and does not smoke. ABI in clinic today was 1.55. She has had formal segmental arterial Dopplers done, a little over a year ago, which did not show any evidence of occlusive vascular disease. On the dorsal aspect of the right foot, there is a wound with a lot of old hematoma, slough, eschar, and and nonviable subcutaneous tissue. T the best of my o ability to discern, it does appear to involve the muscle layer, but there is no  evidence of necrosis. The wound is pouring serous fluid; the patient has 3+ pitting edema to the bilateral lower extremities. 09/29/2022: The patient came to clinic today with nothing but a T overlying her wound. She is accompanied by her husband today. She has not elfa communicated with her cardiologist regarding her fluid overload. She still has 3+ pitting edema bilaterally. The wound is still pouring serous fluid, although  not quite as effusively as at her initial visit. There is still quite a bit of nonviable tissue and hematoma present. 10/13; patient's wound on the right lateral lower leg his eschared over and may well be on its way to healing. The real problem here is on the right dorsal foot just proximal to her toes. The patient is still puzzled by the cause of this. Looking at a picture on the patient's smart phone there is a suggestion that this might have been a hematoma which seems to fit what Dr. [name] had felt on her initial evaluation. They have been using iodoform packing kerlix and Coban. 10/20; right lower leg is healed. We are approved for snap VAC on the right foot. We will try to arrange home health but until then she is going to need a nurse visit on Tuesday 10/20/2022: It has been 2 weeks since I have seen the wound. It has improved significantly since then. She is currently in a snap VAC and I think this has made quite a difference. The overall wound measurements are smaller. There is no massive drainage, as there had been previously. There is some good granulation tissue forming underneath a bit of slough. 10/30/2022: The wound continues to improve with use of the snap VAC. The swelling in her foot is better. The undermining continues to close in. There is a layer of slough overlying good granulation tissue. 11/06/2022: The wound has contracted further. The undermining is still present but the wound surface is better and her edema control is good. 11/13/2022: The wound is  smaller again and the undermining has decreased as well. There is slough accumulation over a nice bed of granulation tissue. Electronic Signature(s) Signed: 11/13/2022 3:30:55 PM By: Fredirick Maudlin MD FACS Entered By: Fredirick Maudlin on 11/13/2022 15:30:55 -------------------------------------------------------------------------------- Physical Exam Details Patient Name: Date of Service: [name] RO N [name]. 11/13/2022 2:30 PM Medical Record Number: [id number] Patient Account Number: [id number] Date of Birth/Sex: Treating RN: 03/21/1946 (76 y.o. F) Primary Care Provider: [name] Other Clinician: Referring Provider: Treating Provider/Extender: [name] in Treatment: [address] [place] ([id number]) [id number] Page 3 of 9 Constitutional . . . . No acute distress. Respiratory Normal work of breathing on room air. Notes 11/13/2022: The wound is smaller again and the undermining has decreased as well. There is slough accumulation over a nice bed of granulation tissue. Electronic Signature(s) Signed: 11/13/2022 3:31:31 PM By: Fredirick Maudlin MD FACS Entered By: Fredirick Maudlin on 11/13/2022 15:31:31 -------------------------------------------------------------------------------- Physician Orders Details Patient Name: Date of Service: [name] RO N [name]. 11/13/2022 2:30 PM Medical Record Number: [id number] Patient Account Number: [id number] Date of Birth/Sex: Treating RN: 09-18-1946 (76 y.o. 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 - Dr. [name] Rm 4 Return A Bathing/ Shower/ Hygiene May shower with protection but do not get wound dressing(s) wet. Negative Presssure Wound Therapy SNAP Vac to wound continuously at 117m/hg pressure Blue Foam Edema Control -  Lymphedema / SCD / Other Left Lower Extremity Elevate legs to the level of the heart or above for 30 minutes daily and/or when sitting, a frequency of: Avoid standing for long periods of time. Patient to wear own compression stockings every day. Compression stocking or Garment 20-30 mm/Hg pressure to: - left leg Home Health dmit to Home Health for wound care. May utilize formulary equivalent dressing for wound treatment orders unless otherwise specified. - A For skilled nursing for wound care. New wound care orders this week; continue Home Health for wound care. May utilize formulary equivalent dressing for wound treatment orders unless otherwise specified. - Skilled nursing referral for wound care Wound Treatment Wound #1 - Foot Wound Laterality: Dorsal, Right Cleanser: Soap and Water 1 x Per Day/30 Days Discharge Instructions: May shower and wash wound with dial antibacterial soap and water prior to dressing change. Cleanser: Wound Cleanser 1 x Per Day/30 Days Discharge Instructions: Cleanse the wound with wound cleanser prior to applying a clean dressing using gauze sponges, not tissue or cotton balls. ROSHANA, SHUFFIELD ([id number]) [id number] Page 4 of 9 Topical: Skintegrity Hydrogel 4 (oz) 1 x Per Day/30 Days Discharge Instructions: Apply hydrogel as directed Prim Dressing: Promogran Prisma Matrix, 4.34 (sq  in) (silver collagen) 1 x Per Day/30 Days ary Discharge Instructions: Moisten collagen with saline or hydrogel Secured With: Transpore Surgical Tape, 2x10 (in/yd) 1 x Per Day/30 Days Discharge Instructions: Secure dressing with tape as directed. Compression Wrap: Kerlix Roll 4.5x3.1 (in/yd) (Generic) 1 x Per Day/30 Days Discharge Instructions: Apply Kerlix and Coban compression as directed. Notes SNAP vac held for the week due to lack of stock in clinic. Pt provided with supplies to get through the week till next clinic appointment. Electronic Signature(s) Signed: 11/13/2022 4:40:34 PM By: Fredirick Maudlin MD FACS Signed: 11/13/2022 4:43:27 PM By: Blanche East RN Entered By: Blanche East on 11/13/2022 16:20:19 -------------------------------------------------------------------------------- Problem List Details Patient Name: Date of Service: MEGHA, AGNES RO N [name]. 11/13/2022 2:30 PM Medical Record Number: [id number] Patient Account Number: [id number] Date of Birth/Sex: Treating RN: [birth year] (76 y.o. 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 09/20/2022 No Yes without evidence of necrosis L97.812 Non-pressure chronic ulcer of other part of right lower leg with fat layer 09/20/2022 No Yes exposed C58.52 Chronic systolic (congestive) heart failure 09/20/2022 No Yes M06.9 Rheumatoid arthritis, unspecified 09/20/2022 No Yes Z79.52 Long term (current) use of systemic steroids 09/20/2022 No Yes I70.90 Unspecified atherosclerosis 09/20/2022 No Yes 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 11/13/2022 2:30 PM Medical Record Number: [id number] Patient Account Number: [id number] Date of Birth/Sex: Treating RN: 05/19/1946 (76 y.o. F) Primary Care Provider: [name] Other Clinician: Referring Provider: Treating Provider/Extender: [name] in Treatment: 7 Subjective Chief Complaint Information obtained from Patient Patient seen for complaints of Non-Healing Wounds. History of Present Illness (HPI) ADMISSION 09/20/2022 This is a 76 year old woman with a past medical history significant for congestive heart failure, atrial fibrillation, rheumatoid arthritis on long-term steroid treatment, CMML currently  receiving chemotherapy and moderate malnutrition. She presents to clinic today with a wound on the dorsum of her right foot. She says it started out as a bruise and then developed a bump which subsequently broke open. She says she was sent to our clinic by her oncologist. She is not diabetic and does not smoke. ABI in clinic today was 1.55. She has had formal segmental arterial Dopplers done, a little over a year ago, which did not show any evidence of occlusive vascular disease. On the dorsal aspect of the right foot, there is a wound with a lot of old hematoma, slough, eschar, and and nonviable subcutaneous tissue. T the best of my o ability to discern, it does appear to involve the muscle layer, but there is no evidence of necrosis. The wound is pouring serous fluid; the patient has 3+ pitting edema to the bilateral lower extremities. 09/29/2022: The patient came to clinic today with nothing but a T overlying her  wound. She is accompanied by her husband today. She has not elfa communicated with her cardiologist regarding her fluid overload. She still has 3+ pitting edema bilaterally. The wound is still pouring serous fluid, although not quite as effusively as at her initial visit. There is still quite a bit of nonviable tissue and hematoma present. 10/13; patient's wound on the right lateral lower leg his eschared over and may well be on its way to healing. The real problem here is on the right dorsal foot just proximal to her toes. The patient is still puzzled by the cause of this. Looking at a picture on the patient's smart phone there is a suggestion that this might have been a hematoma which seems to fit what Dr. [name] had felt on her initial evaluation. They have been using iodoform packing kerlix and Coban. 10/20; right lower leg is healed. We are approved for snap VAC on the right foot. We will try to arrange home health but until then she is going to need a nurse visit on Tuesday 10/20/2022: It has been 2 weeks since I have seen the wound. It has improved significantly since then. She is currently in a snap VAC and I think this has made quite a difference. The overall wound measurements are smaller. There is no massive drainage, as there had been previously. There is some good granulation tissue forming underneath a bit of slough. 10/30/2022: The wound continues to improve with use of the snap VAC. The swelling in her foot is better. The undermining continues to close in. There is a layer of slough overlying good granulation tissue. 11/06/2022: The wound has contracted further. The undermining is still present but the wound surface is better and her edema control is good. 11/13/2022: The wound is smaller again and the undermining has decreased as well. There is slough accumulation over a nice bed of granulation tissue. Patient History Information obtained from Patient. Family History Unknown  History, Diabetes - Father, Heart Disease - Father, No family history of Cancer. Social History Former smoker - quit 2003, Marital Status - Married, Alcohol Use - Daily, Drug Use - Prior History, Caffeine Use - Daily. Medical History Eyes Denies history of Cataracts, Glaucoma, Optic Neuritis Ear/Nose/Mouth/Throat Denies history of Chronic sinus problems/congestion, Middle ear problems Respiratory [place], [place] ([id number]) [id number] Page 6 of 9 Patient has history of Asthma, Chronic Obstructive Pulmonary Disease (COPD) Cardiovascular Patient has history of Congestive Heart Failure, Hypertension Denies history of Coronary Artery Disease, Deep Vein Thrombosis, Hypotension, Myocardial Infarction Endocrine  Denies history of Type I Diabetes, Type II Diabetes Immunological Denies history of Lupus Erythematosus, Raynaudoos, Scleroderma Integumentary (Skin) Denies history of History of Burn Musculoskeletal Patient has history of Rheumatoid Arthritis Denies history of Gout Neurologic Patient has history of Neuropathy Oncologic Patient has history of Received Chemotherapy - currently receiving Hospitalization/Surgery History - reverse shoulder arthroplasty Rt- 2018. Medical A Surgical History Notes nd Cardiovascular AFIB Genitourinary Stage 3 CKD Objective Constitutional No acute distress. Vitals Time Taken: 2:55 PM, Height: 64 in, Weight: 111 lbs, BMI: 19.1, Temperature: 97.8 F, Pulse: 73 bpm, Respiratory Rate: 16 breaths/min, Blood Pressure: 106/57 mmHg. Respiratory Normal work of breathing on room air. General Notes: 11/13/2022: The wound is smaller again and the undermining has decreased as well. There is slough accumulation over a nice bed of granulation tissue. Integumentary (Hair, Skin) Wound #1 status is Open. Original cause of wound was Blister. The date acquired was: 08/23/2022. The wound has been in treatment 7 weeks. The wound  is located on the Right,Dorsal Foot. The wound measures 1cm length x 1cm width x 0.2cm depth; 0.785cm^2 area and 0.157cm^3 volume. There is Fat Layer (Subcutaneous Tissue) exposed. There is no tunneling noted, however, there is undermining starting at 12:00 and ending at 12:00 with a maximum distance of 0.4cm. There is a medium amount of serosanguineous drainage noted. The wound margin is distinct with the outline attached to the wound base. There is medium (34-66%) red granulation within the wound bed. There is a medium (34-66%) amount of necrotic tissue within the wound bed including Adherent Slough. The periwound skin appearance had no abnormalities noted for texture. The periwound skin appearance had no abnormalities noted for moisture. The periwound skin appearance exhibited: Erythema. The surrounding wound skin color is noted with erythema. Periwound temperature was noted as No Abnormality. 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 There was a Excisional Skin/Subcutaneous Tissue Debridement with a total area of 1 sq cm performed by Fredirick Maudlin, MD. With the following instrument(s): Curette Material removed includes Subcutaneous Tissue and Slough and. No specimens were taken. A time out was conducted at 15:03, prior to the start of the procedure. A Minimum amount of bleeding was controlled SOLEDAD, BUDREAU ([id number]) [phone].pdf Page 7 of 9 with Pressure. The  procedure was tolerated well. Post Debridement Measurements: 1cm length x 1cm width x 0.2cm depth; 0.157cm^3 volume. Character of Wound/Ulcer Post Debridement requires further debridement. Post procedure Diagnosis Wound #1: Same as Pre-Procedure General Notes: Scribed for Dr. [name] by Blanche East, RN. Plan Follow-up Appointments: Return Appointment in 1 week. - Dr. [name] Rm 4 Bathing/ Shower/ Hygiene: May shower with protection but do not get wound dressing(s) wet. Negative Presssure Wound Therapy: SNAP Vac to wound continuously at 144m/hg pressure Blue Foam Edema Control - Lymphedema / SCD / Other: Elevate legs to the level of the heart or above for 30 minutes daily and/or when sitting, a frequency of: Avoid standing for long periods of time. Patient to wear own compression stockings every day. Compression stocking or Garment 20-30 mm/Hg pressure to: - left leg Home Health: [facility]. May utilize formulary equivalent dressing for wound treatment orders unless otherwise specified. - For skilled nursing for wound care. New wound care orders this week; continue Home Health for wound care. May utilize formulary equivalent dressing for wound treatment orders unless otherwise specified. - Skilled nursing referral for wound care General Notes: SNAP vac held for the week due to lack of stock in clinic. Pt provided with supplies to get through the week till next clinic appointment. WOUND #1: - Foot Wound Laterality: Dorsal, Right Cleanser: Soap and Water 1 x Per Day/30 Days Discharge Instructions: May shower and wash wound with dial antibacterial soap and water prior to dressing change. Cleanser: Wound Cleanser 1 x Per Day/30 Days Discharge Instructions: Cleanse the wound with wound cleanser prior to applying a clean dressing using gauze sponges, not tissue or cotton balls. Topical: Skintegrity Hydrogel 4 (oz) 1 x Per Day/30 Days Discharge Instructions: Apply  hydrogel as directed Prim Dressing: Promogran Prisma Matrix, 4.34 (sq in) (silver collagen) 1 x Per Day/30 Days ary Discharge Instructions: Moisten collagen with saline or hydrogel Secured With: Transpore Surgical T ape, 2x10 (in/yd) 1 x Per Day/30 Days Discharge Instructions: Secure dressing with tape as directed. Com pression Wrap: Kerlix Roll 4.5x3.1 (in/yd) (Generic) 1 x Per Day/30 Days Discharge Instructions: Apply Kerlix and Coban compression as directed. 11/13/2022: The wound is smaller again and the undermining has decreased as well. There is slough accumulation over a nice bed of granulation tissue. I used a curette to debride slough and nonviable subcutaneous tissue from her wound. Due to lack of snap VAC stock in clinic, we were unable to apply it this week. She will use Prisma silver collagen to pack her wound for the next week and hopefully at her upcoming visit in 1 week's time, we will have our supply in again and can reapply the [facility]. Electronic Signature(s) Signed: 11/13/2022 3:34:01 PM By: Fredirick Maudlin MD FACS Entered By: Fredirick Maudlin on 11/13/2022 15:34:01 -------------------------------------------------------------------------------- HxROS Details Patient Name: Date of Service: [name] RO N [name]. 11/13/2022 2:30 PM Medical Record Number: [id number] Patient Account Number: [id number] Date of Birth/Sex: Treating RN: 1946-05-21 (76 y.o. F) Primary Care Provider: [name] Other Clinician: Referring Provider: Treating Provider/Extender: [name] in Treatment: 7 Information Obtained From Patient Eyes Medical History: Negative for: Cataracts; Glaucoma; Optic Neuritis HAN, LYSNE ([id number]) [phone].pdf Page 8 of 9 Ear/Nose/Mouth/Throat Medical History: Negative for: Chronic sinus problems/congestion; Middle ear problems Respiratory Medical History: Positive for: Asthma; Chronic  Obstructive Pulmonary Disease (COPD) Cardiovascular Medical History: Positive for: Congestive Heart Failure; Hypertension Negative for: Coronary Artery Disease; Deep Vein Thrombosis; Hypotension; Myocardial Infarction Past Medical History Notes: AFIB Endocrine Medical History: Negative for: Type I Diabetes; Type II Diabetes Genitourinary Medical History: Past Medical History Notes: Stage 3 CKD Immunological Medical History: Negative for: Lupus Erythematosus; Raynauds; Scleroderma Integumentary (Skin) Medical History: Negative for: History of Burn Musculoskeletal Medical History: Positive for: Rheumatoid Arthritis Negative for: Gout Neurologic Medical History: Positive for: Neuropathy Oncologic Medical History: Positive for: Received Chemotherapy - currently receiving Immunizations Pneumococcal Vaccine: Received Pneumococcal Vaccination: Yes Received Pneumococcal Vaccination On or After 60th Birthday: Yes Implantable Devices None Hospitalization / Surgery History Type of Hospitalization/Surgery reverse shoulder arthroplasty Rt- 2018 Family and Social History Unknown History: Yes; Cancer: No; Diabetes: Yes - Father; Heart Disease: Yes - Father; Former smoker - quit 2003; Marital Status - Married; Alcohol Use: Daily; Drug Use: Prior History; Caffeine Use: Daily; Financial Concerns: No; Food, Clothing or Shelter Needs:  No; Support System Lacking: No; Transportation Concerns: No Electronic Signature(s) Signed: 11/13/2022 4:40:34 PM By: Fredirick Maudlin MD [place], [place] ([id number]) [id number] Page 9 of 9 Entered By: Fredirick Maudlin on 11/13/2022 15:31:01 -------------------------------------------------------------------------------- SuperBill Details Patient Name: Date of Service: PARA, COSSEY 11/13/2022 Medical Record Number: [id number] Patient Account Number: [id number] Date of Birth/Sex: Treating RN: 1946-10-17 (76 y.o.  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 Facility Procedures : CPT4 Code: [id number] Description: 11042 - DEB SUBQ TISSUE 20 SQ CM/< ICD-10 Diagnosis Description 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 Modifier: without evidence of n Quantity: 1 ecrosis Physician Procedures : CPT4 Code Description Modifier [id number] 96924 - WC PHYS LEVEL 4 - EST PT 25 ICD-10 Diagnosis Description 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

## 2022-11-14 ENCOUNTER — Inpatient Hospital Stay: Payer: Medicare Other

## 2022-11-14 DIAGNOSIS — C931 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: Secondary | ICD-10-CM

## 2022-11-14 DIAGNOSIS — Z5111 Encounter for antineoplastic chemotherapy: Secondary | ICD-10-CM | POA: Diagnosis not present

## 2022-11-14 DIAGNOSIS — Z87891 Personal history of nicotine dependence: Secondary | ICD-10-CM | POA: Diagnosis not present

## 2022-11-14 DIAGNOSIS — R21 Rash and other nonspecific skin eruption: Secondary | ICD-10-CM | POA: Diagnosis not present

## 2022-11-14 LAB — CBC WITH DIFFERENTIAL/PLATELET
Abs Immature Granulocytes: 0.05 10*3/uL (ref 0.00–0.07)
Basophils Absolute: 0 10*3/uL (ref 0.0–0.1)
Basophils Relative: 1 %
Eosinophils Absolute: 0.2 10*3/uL (ref 0.0–0.5)
Eosinophils Relative: 3 %
HCT: 31.8 % — ABNORMAL LOW (ref 36.0–46.0)
Hemoglobin: 10.5 g/dL — ABNORMAL LOW (ref 12.0–15.0)
Immature Granulocytes: 1 %
Lymphocytes Relative: 17 %
Lymphs Abs: 1.1 10*3/uL (ref 0.7–4.0)
MCH: 35.8 pg — ABNORMAL HIGH (ref 26.0–34.0)
MCHC: 33 g/dL (ref 30.0–36.0)
MCV: 108.5 fL — ABNORMAL HIGH (ref 80.0–100.0)
Monocytes Absolute: 1.2 10*3/uL — ABNORMAL HIGH (ref 0.1–1.0)
Monocytes Relative: 19 %
Neutro Abs: 3.9 10*3/uL (ref 1.7–7.7)
Neutrophils Relative %: 59 %
Platelets: 44 10*3/uL — ABNORMAL LOW (ref 150–400)
RBC: 2.93 MIL/uL — ABNORMAL LOW (ref 3.87–5.11)
RDW: 15.1 % (ref 11.5–15.5)
WBC: 6.5 10*3/uL (ref 4.0–10.5)
nRBC: 0 % (ref 0.0–0.2)

## 2022-11-14 LAB — CMP (CANCER CENTER ONLY)
ALT: 29 U/L (ref 0–44)
AST: 24 U/L (ref 15–41)
Albumin: 4 g/dL (ref 3.5–5.0)
Alkaline Phosphatase: 51 U/L (ref 38–126)
Anion gap: 7 (ref 5–15)
BUN: 16 mg/dL (ref 8–23)
CO2: 32 mmol/L (ref 22–32)
Calcium: 9.9 mg/dL (ref 8.9–10.3)
Chloride: 98 mmol/L (ref 98–111)
Creatinine: 0.98 mg/dL (ref 0.44–1.00)
GFR, Estimated: >60 mL/min (ref >60)
Glucose, Bld: 111 mg/dL — ABNORMAL HIGH (ref 70–99)
Potassium: 4.9 mmol/L (ref 3.5–5.1)
Sodium: 137 mmol/L (ref 135–145)
Total Bilirubin: 0.9 mg/dL (ref 0.3–1.2)
Total Protein: 6.2 g/dL — ABNORMAL LOW (ref 6.5–8.1)

## 2022-11-20 ENCOUNTER — Encounter (HOSPITAL_BASED_OUTPATIENT_CLINIC_OR_DEPARTMENT_OTHER): Payer: Medicare Other | Admitting: General Surgery

## 2022-11-20 DIAGNOSIS — I890 Lymphedema, not elsewhere classified: Secondary | ICD-10-CM | POA: Diagnosis not present

## 2022-11-20 DIAGNOSIS — Z7952 Long term (current) use of systemic steroids: Secondary | ICD-10-CM | POA: Diagnosis not present

## 2022-11-20 DIAGNOSIS — L97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 evidence of necrosis: Secondary | ICD-10-CM | POA: Diagnosis not present

## 2022-11-20 DIAGNOSIS — E11621 Type 2 diabetes mellitus with foot ulcer: Secondary | ICD-10-CM | POA: Diagnosis not present

## 2022-11-20 DIAGNOSIS — I5022 Chronic systolic (congestive) heart failure: Secondary | ICD-10-CM | POA: Diagnosis not present

## 2022-11-20 DIAGNOSIS — M069 Rheumatoid arthritis, unspecified: Secondary | ICD-10-CM | POA: Diagnosis not present

## 2022-11-20 DIAGNOSIS — L97812 Non-pressure chronic ulcer of other part of right lower leg with fat layer exposed: Secondary | ICD-10-CM | POA: Diagnosis not present

## 2022-11-20 NOTE — Progress Notes (Signed)
CHALYN, AMESCUA ([id number]) [id number] Page 1 of 8 Visit Report for 11/20/2022 Arrival Information Details Patient Name: Date of Service: TOSHA, BELGARDE 11/20/2022 12:30 PM Medical Record Number: [id number] Patient Account Number: [id number] Date of Birth/Sex: Treating RN: 1946-11-14 (76 y.o. [name], [name] Primary Care Suleika Donavan: [name] Other Clinician: Referring Nicolus Ose: Treating Cloyce Paterson/Extender: [name] in Treatment: 8 Visit Information History Since Last Visit Added or deleted any medications: No Patient Arrived: Wheel Chair Any new allergies or adverse reactions: No Arrival Time: 12:47 Had a fall or experienced change in No Accompanied By: husband activities of daily living that may affect Transfer Assistance: None risk of falls: Patient Requires Transmission-Based Precautions: No Signs or symptoms of abuse/neglect since last visito No Patient Has Alerts: No Hospitalized since last visit: No Implantable device outside of the clinic excluding No cellular tissue based products placed in the center since last visit: Has Dressing in Place as Prescribed: Yes Pain Present Now: No Electronic Signature(s) Signed: 11/20/2022 4:55:50 PM By: Blanche East RN Entered By: Blanche East on 11/20/2022 12:47:52 -------------------------------------------------------------------------------- Encounter Discharge Information Details Patient Name: Date of Service: [place], [place]. 11/20/2022 12:30 PM Medical Record Number: [id number] Patient Account Number: [id number] Date of Birth/Sex: Treating RN: [birth year] (76 y.o. Marta Lamas Primary Care Fannie Alomar: [name] Other Clinician: Referring Gevork Ayyad: Treating Shealee Yordy/Extender: [name] in Treatment: 8 Encounter Discharge Information Items Post Procedure Vitals Discharge Condition: Stable Temperature (F):  98.0 Ambulatory Status: Ambulatory Pulse (bpm): 74 Discharge Destination: Home Respiratory Rate (breaths/min): 16 Transportation: Private Auto Blood Pressure (mmHg): 119/52 Accompanied By: self Schedule Follow-up Appointment: Yes Clinical Summary of Care: Electronic Signature(s) Signed: 11/20/2022 4:55:50 PM By: Blanche East RN Entered By: Blanche East on 11/20/2022 16:49:37 Blount, [name] ([id number]) [id number] Page 2 of 8 -------------------------------------------------------------------------------- Lower Extremity Assessment Details Patient Name: Date of Service: STARSHA, MORNING 11/20/2022 12:30 PM Medical Record Number: [id number] Patient Account Number: [id number] Date of Birth/Sex: Treating RN: 13-Feb-1946 (76 y.o. Marta Lamas Primary Care Torsten Weniger: [name] Other Clinician: Referring Ilse Billman: Treating Hilliary Jock/Extender: [name] in Treatment: 8 Edema Assessment Assessed: [Left: No] [Right: No] [Left: Edema] [Right: :] Calf Left: Right: Point of Measurement: From Medial Instep 27 cm Ankle Left: Right: Point of Measurement: From Medial Instep 20 cm Vascular Assessment Pulses: Dorsalis Pedis Palpable: [Right:Yes] Electronic Signature(s) Signed: 11/20/2022 4:55:50 PM By: Blanche East RN Entered By: Blanche East on 11/20/2022 12:48:37 -------------------------------------------------------------------------------- Multi Wound Chart Details Patient Name: Date of Service: [name] 11/20/2022 12:30 PM Medical Record Number: [id number] Patient Account Number: [id number] Date of Birth/Sex: Treating RN: June 05, 1946 (76 y.o. F) Primary Care Laranda Burkemper: [name] Other Clinician: Referring Taitum Menton: Treating Abbigail Anstey/Extender: [name] in Treatment: 8 Vital Signs Height(in): 82 Pulse(bpm): 33 Weight(lbs): 111 Blood Pressure(mmHg):  119/52 Body Mass Index(BMI): 19.1 Temperature(F): 98.0 Respiratory Rate(breaths/min): 16 [1:Photos:] [N/A:N/A] Right, Dorsal Foot N/A N/A Wound Location: Blister N/A N/A Wounding Event: Lymphedema N/A N/A Primary Etiology: Asthma, Chronic Obstructive N/A N/A Comorbid History: Pulmonary Disease (COPD), Congestive Heart Failure, Hypertension, Rheumatoid Arthritis, Neuropathy, Received Chemotherapy 08/23/2022 N/A N/A Date Acquired: 8 N/A N/A Weeks of Treatment: Open N/A N/A Wound Status: No N/A N/A Wound Recurrence: 1x0.8x0.2 N/A N/A Measurements L x W x D (cm) 0.628 N/A N/A A (cm) : rea 0.126 N/A N/A Volume (cm) : 39.40% N/A N/A % Reduction in A rea: 79.70% N/A N/A % Reduction in Volume: 6  Starting Position 1 (o'clock): 3 Ending Position 1 (o'clock): 1.1 Maximum Distance 1 (cm): Yes N/A N/A Undermining: Full Thickness Without Exposed N/A N/A Classification: Support Structures Medium N/A N/A Exudate A mount: Serosanguineous N/A N/A Exudate Type: red, brown N/A N/A Exudate Color: Distinct, outline attached N/A N/A Wound Margin: Medium (34-66%) N/A N/A Granulation A mount: Pink N/A N/A Granulation Quality: Medium (34-66%) N/A N/A Necrotic A mount: Fat Layer (Subcutaneous Tissue): Yes N/A N/A Exposed Structures: Fascia: No Tendon: No Muscle: No Joint: No Bone: No None N/A N/A Epithelialization: Debridement - Excisional N/A N/A Debridement: Pre-procedure Verification/Time Out 13:02 N/A N/A Taken: Lidocaine 4% Topical Solution N/A N/A Pain Control: Subcutaneous, Slough N/A N/A Tissue Debrided: Skin/Subcutaneous Tissue N/A N/A Level: 0.8 N/A N/A Debridement A (sq cm): rea Curette N/A N/A Instrument: Minimum N/A N/A Bleeding: Pressure N/A N/A Hemostasis A chieved: Procedure was tolerated well N/A N/A Debridement Treatment Response: 1x0.8x0.2 N/A N/A Post Debridement Measurements L x W x D (cm) 0.126 N/A N/A Post Debridement  Volume: (cm) No Abnormalities Noted N/A N/A Periwound Skin Texture: Dry/Scaly: Yes N/A N/A Periwound Skin Moisture: Erythema: No N/A N/A Periwound Skin Color: No Abnormality N/A N/A Temperature: Debridement N/A N/A Procedures Performed: Negative Pressure Wound Therapy Maintenance (NPWT) Treatment Notes Electronic Signature(s) Signed: 11/20/2022 1:29:23 PM By: Fredirick Maudlin MD FACS Entered By: Fredirick Maudlin on 11/20/2022 13:29:22 -------------------------------------------------------------------------------- Multi-Disciplinary Care Plan Details Patient Name: Date of Service: [place], [place] 11/20/2022 12:30 PM Medical Record Number: [id number] Patient Account Number: [id number] Date of Birth/Sex: Treating RN: [birth year] (76 y.o. Marta Lamas Primary Care Thuy Atilano: [name] Other Clinician: Referring Hanadi Stanly: Treating Khristen Cheyney/Extender: [name] in Treatment: [address], [place] ([id number]) [id number] Page 4 of 8 Active Inactive Peripheral Neuropathy Nursing Diagnoses: Potential alteration in peripheral tissue perfusion (select prior to confirmation of diagnosis) Goals: Patient/caregiver will verbalize understanding of disease process and disease management Date Initiated: 09/20/2022 Target Resolution Date: 11/24/2022 Goal Status: Active Interventions: Provide education on Management of Neuropathy and Related Ulcers Treatment Activities: Patient referred for customized footwear/offloading : 09/20/2022 Notes: Wound/Skin Impairment Nursing Diagnoses: Knowledge deficit related to ulceration/compromised skin integrity Goals: Ulcer/skin breakdown will have a volume reduction of 30% by week 4 Date Initiated: 09/20/2022 Target Resolution Date: 11/24/2022 Goal Status: Active Interventions: Assess ulceration(s) every visit Treatment Activities: Skin care regimen initiated : 09/20/2022 Topical wound  management initiated : 09/20/2022 Notes: Electronic Signature(s) Signed: 11/20/2022 4:55:50 PM By: Blanche East RN Entered By: Blanche East on 11/20/2022 12:49:26 -------------------------------------------------------------------------------- Negative Pressure Wound Therapy Maintenance (NPWT) Details Patient Name: Date of Service: DEJAE, BERNET 11/20/2022 12:30 PM Medical Record Number: [id number] Patient Account Number: [id number] Date of Birth/Sex: Treating RN: Apr 07, 1946 (76 y.o. Marta Lamas Primary Care Jadaya [place]mer[place]: [name] Other Clinician: Referring Jaszmine Navejas: Treating Taariq Leitz/Extender: [name] in Treatment: 8 NPWT Maintenance Performed for: Wound #1 Right, Dorsal Foot Performed By: Blanche East, RN Type: VAC System Coverage Size (sq cm): 0.8 Pressure Type: Constant Pressure Setting: 125 mmHG Drain Type: Channel Primary Contact: Non-Adherent Sponge/Dressing Type: Foam, Blue Date Initiated: 10/13/2022 Date Suspended: 11/13/2022 Date Resumed: 11/20/2022 Dressing Removed: No Canister Changed: No Canister Exudate Volume: 0 Strohman, [place] ([id number]) [id number].pdf Page 5 of 8 Dressing Reapplied: No Respones T Treatment: o tolerated well Days On NPWT : 33 Post Procedure Diagnosis Same as Pre-procedure Electronic Signature(s) Signed: 11/20/2022 4:55:50 PM By: Blanche East RN Entered By: Blanche East on 11/20/2022 13:06:57 -------------------------------------------------------------------------------- Pain Assessment Details Patient Name: Date  of Service: KAYLINA, CAHUE 11/20/2022 12:30 PM Medical Record Number: [id number] Patient Account Number: [id number] Date of Birth/Sex: Treating RN: 09-05-1946 (76 y.o. Marta Lamas Primary Care Miabella Shannahan: [name] Other Clinician: Referring Rondy Krupinski: Treating Lanika Colgate/Extender: [name] in Treatment:  8 Active Problems Location of Pain Severity and Description of Pain Patient Has Paino No Site Locations Pain Management and Medication Current Pain Management: Electronic Signature(s) Signed: 11/20/2022 4:55:50 PM By: Blanche East RN Entered By: Blanche East on 11/20/2022 12:48:11 -------------------------------------------------------------------------------- Patient/Caregiver Education Details Patient Name: Date of Service: Alroy Dust 11/27/2023andnbsp12:30 PM Medical Record Number: [id number] Patient Account Number: [id number] Date of Birth/Gender: Treating RN: 1946-05-08 (76 y.o. Marta Lamas Primary Care Physician: [name] Other Clinician: Referring Physician: Treating Physician/Extender: Veneta Penton [facility], [place] ([id number]) [id number] Page 6 of 8 Weeks in Treatment: 8 Education Assessment Education Provided To: Patient Education Topics Provided Peripheral Neuropathy: Methods: Explain/Verbal Responses: Reinforcements needed, State content correctly Electronic Signature(s) Signed: 11/20/2022 4:55:50 PM By: Blanche East RN Entered By: Blanche East on 11/20/2022 12:49:40 -------------------------------------------------------------------------------- Wound Assessment Details Patient Name: Date of Service: JAZZLENE, HUOT [name] 11/20/2022 12:30 PM Medical Record Number: [id number] Patient Account Number: [id number] Date of Birth/Sex: Treating RN: 09-20-1946 (76 y.o. [name], [name] Primary Care Anirudh Baiz: [name] Other Clinician: Referring Pari Lombard: Treating Kiley Torrence/Extender: [name] in Treatment: 8 Wound Status Wound Number: 1 Primary Lymphedema Etiology: Wound Location: Right, Dorsal Foot Wound Open Wounding Event: Blister Status: Date Acquired: 08/23/2022 Comorbid Asthma, Chronic Obstructive Pulmonary Disease (COPD), Weeks Of Treatment:  8 History: Congestive Heart Failure, Hypertension, Rheumatoid Arthritis, Clustered Wound: No Neuropathy, Received Chemotherapy Photos Wound Measurements Length: (cm) 1 Width: (cm) 0.8 Depth: (cm) 0.2 Area: (cm) 0.628 Volume: (cm) 0.126 % Reduction in Area: 39.4% % Reduction in Volume: 79.7% Epithelialization: None Tunneling: No Undermining: Yes Starting Position (o'clock): 6 Ending Position (o'clock): 3 Maximum Distance: (cm) 1.1 Wound Description Classification: Full Thickness Without Exposed Support Wound Margin: Distinct, outline attached Exudate Amount: Medium [place], Kellen H ([id number]) Exudate Type: Serosanguineous Exudate Color: red, brown Structures Foul Odor After Cleansing: No Slough/Fibrino Yes [phone].pdf Page 7 of 8 Wound Bed Granulation Amount: Medium (34-66%) Exposed Structure Granulation Quality: Pink Fascia Exposed: No Necrotic Amount: Medium (34-66%) Fat Layer (Subcutaneous Tissue) Exposed: Yes Necrotic Quality: Adherent Slough Tendon Exposed: No Muscle Exposed: No Joint Exposed: No Bone Exposed: No Periwound Skin Texture Texture Color No Abnormalities Noted: Yes No Abnormalities Noted: No Erythema: No Moisture No Abnormalities Noted: No Temperature / Pain Dry / Scaly: Yes Temperature: No Abnormality Treatment Notes Wound #1 (Foot) Wound Laterality: Dorsal, Right Cleanser Soap and Water Discharge Instruction: May shower and wash wound with dial antibacterial soap and water prior to dressing change. Wound Cleanser Discharge Instruction: Cleanse the wound with wound cleanser prior to applying a clean dressing using gauze sponges, not tissue or cotton balls. Peri-Wound Care Topical Skintegrity Hydrogel 4 (oz) Discharge Instruction: Apply hydrogel as directed Primary Dressing Promogran Prisma Matrix, 4.34 (sq in) (silver collagen) Discharge Instruction: Moisten collagen with saline or hydrogel Secondary  Dressing Secured With Transpore Surgical Tape, 2x10 (in/yd) Discharge Instruction: Secure dressing with tape as directed. Compression Wrap Kerlix Roll 4.5x3.1 (in/yd) Discharge Instruction: Apply Kerlix and Coban compression as directed. Compression Stockings Add-Ons Electronic Signature(s) Signed: 11/20/2022 4:55:50 PM By: Blanche East RN Entered By: Blanche East on 11/20/2022 12:56:45 -------------------------------------------------------------------------------- [place] Details Patient Name: Date of Service: [name] 11/20/2022 12:30 PM  Medical Record Number: [id number] Patient Account Number: [id number] Date of Birth/Sex: Treating RN: November 24, 1946 (77 y.o. Marta Lamas Primary Care Debany Vantol: [name] Other Clinician: Referring Angel Weedon: Treating Nao Linz/Extender: [name] in Treatment: [address] [place] ([id number]) [id number] Page 8 of 8 Vital Signs Time Taken: 12:47 Temperature (F): 98.0 Height (in): 64 Pulse (bpm): 74 Weight (lbs): 111 Respiratory Rate (breaths/min): 16 Body Mass Index (BMI): 19.1 Blood Pressure (mmHg): 119/52 Reference Range: 80 - 120 mg / dl Electronic Signature(s) Signed: 11/20/2022 4:55:50 PM By: Blanche East RN Entered By: Blanche East on 11/20/2022 12:48:06

## 2022-11-20 NOTE — Progress Notes (Signed)
BARABARA, [name] ([id number]) [id number] Page 1 of 9 Visit Report for 11/20/2022 Chief Complaint Document Details Patient Name: Date of Service: [name], [name] 11/20/2022 12:30 PM Medical Record Number: [id number] Patient Account Number: [id number] Date of Birth/Sex: Treating RN: 1946-04-06 (76 y.o. F) Primary Care Provider: [name] Other Clinician: Referring Provider: Treating Provider/Extender: [name] in Treatment: 8 Information Obtained from: Patient Chief Complaint Patient seen for complaints of Non-Healing Wounds. Electronic Signature(s) Signed: 11/20/2022 1:32:43 PM By: Fredirick Maudlin MD FACS Entered By: Fredirick Maudlin on 11/20/2022 13:32:43 -------------------------------------------------------------------------------- Debridement Details Patient Name: Date of Service: [name] [name] 11/20/2022 12:30 PM Medical Record Number: [id number] Patient Account Number: [id number] Date of Birth/Sex: Treating RN: 1946/12/10 (76 y.o. [name], [name] Primary Care Provider: [name] Other Clinician: Referring Provider: Treating Provider/Extender: [name] in Treatment: 8 Debridement Performed for Assessment: Wound #1 Right,Dorsal Foot Performed By: Physician Fredirick Maudlin, MD Debridement Type: Debridement Level of Consciousness (Pre-procedure): Awake and Alert Pre-procedure Verification/Time Out Yes - 13:02 Taken: Start Time: 13:03 Pain Control: Lidocaine 4% T opical Solution T Area Debrided (L x W): otal 1 (cm) x 0.8 (cm) = 0.8 (cm) Tissue and other material debrided: Viable, Non-Viable, Slough, Subcutaneous, Slough Level: Skin/Subcutaneous Tissue Debridement Description: Excisional Instrument: Curette Bleeding: Minimum Hemostasis Achieved: Pressure Response to Treatment: Procedure was tolerated well Level of Consciousness (Post- Awake and  Alert procedure): Post Debridement Measurements of Total Wound Length: (cm) 1 Width: (cm) 0.8 Depth: (cm) 0.2 Volume: (cm) 0.126 Character of Wound/Ulcer Post Debridement: Requires Further Debridement Post Procedure Diagnosis Same as Pre-procedure Angely, Dietz Wapello ([id number]) [phone].pdf Page 2 of 9 Notes Scribed for Dr. [name] by Blanche East, RN Electronic Signature(s) Signed: 11/20/2022 4:55:50 PM By: Blanche East RN Signed: 11/20/2022 4:56:20 PM By: Fredirick Maudlin MD FACS Entered By: Blanche East on 11/20/2022 13:04:49 -------------------------------------------------------------------------------- HPI Details Patient Name: Date of Service: [name] [name] 11/20/2022 12:30 PM Medical Record Number: [id number] Patient Account Number: [id number] Date of Birth/Sex: Treating RN: 04/06/1946 (76 y.o. F) Primary Care Provider: [name] Other Clinician: Referring Provider: Treating Provider/Extender: [name] in Treatment: 8 History of Present Illness HPI Description: ADMISSION 09/20/2022 This is a 76 year old woman with a past medical history significant for congestive heart failure, atrial fibrillation, rheumatoid arthritis on long-term steroid treatment, CMML currently receiving chemotherapy and moderate malnutrition. She presents to clinic today with a wound on the dorsum of her right foot. She says it started out as a bruise and then developed a bump which subsequently broke open. She says she was sent to our clinic by her oncologist. She is not diabetic and does not smoke. ABI in clinic today was 1.55. She has had formal segmental arterial Dopplers done, a little over a year ago, which did not show any evidence of occlusive vascular disease. On the dorsal aspect of the right foot, there is a wound with a lot of old hematoma, slough, eschar, and and nonviable subcutaneous tissue. T the best of  my o ability to discern, it does appear to involve the muscle layer, but there is no evidence of necrosis. The wound is pouring serous fluid; the patient has 3+ pitting edema to the bilateral lower extremities. 09/29/2022: The patient came to clinic today with nothing but a T overlying her wound. She is accompanied by her husband today. She has not elfa communicated with her cardiologist regarding her fluid overload. She still has 3+ pitting edema  bilaterally. The wound is still pouring serous fluid, although not quite as effusively as at her initial visit. There is still quite a bit of nonviable tissue and hematoma present. 10/13; patient's wound on the right lateral lower leg his eschared over and may well be on its way to healing. The real problem here is on the right dorsal foot just proximal to her toes. The patient is still puzzled by the cause of this. Looking at a picture on the patient's smart phone there is a suggestion that this might have been a hematoma which seems to fit what Dr. [name] had felt on her initial evaluation. They have been using iodoform packing kerlix and Coban. 10/20; right lower leg is healed. We are approved for snap VAC on the right foot. We will try to arrange home health but until then she is going to need a nurse visit on Tuesday 10/20/2022: It has been 2 weeks since I have seen the wound. It has improved significantly since then. She is currently in a snap VAC and I think this has made quite a difference. The overall wound measurements are smaller. There is no massive drainage, as there had been previously. There is some good granulation tissue forming underneath a bit of slough. 10/30/2022: The wound continues to improve with use of the snap VAC. The swelling in her foot is better. The undermining continues to close in. There is a layer of slough overlying good granulation tissue. 11/06/2022: The wound has contracted further. The undermining is still present but  the wound surface is better and her edema control is good. 11/13/2022: The wound is smaller again and the undermining has decreased as well. There is slough accumulation over a nice bed of granulation tissue. 11/20/2022: We did not have any snap VAC devices in clinic last week so the wound was just packed. It did well and is smaller with less undermining today. There is still slough present on the surface. Electronic Signature(s) Signed: 11/20/2022 1:34:58 PM By: Fredirick Maudlin MD FACS Entered By: Fredirick Maudlin on 11/20/2022 13:34:58 -------------------------------------------------------------------------------- Physical Exam Details Patient Name: Date of Service: [name] [name] 11/20/2022 12:30 PM Medical Record Number: [id number] Patient Account Number: [id number] MIRJANA, TARLETON ([id number]) [phone].pdf Page 3 of 9 Date of Birth/Sex: Treating RN: Jun 16, 1946 (76 y.o. F) Primary Care Provider: Other Clinician: [name] Referring Provider: Treating Provider/Extender: [name] in Treatment: 8 Constitutional . . . . No acute distress. Respiratory Normal work of breathing on room air. Notes 11/20/2022: The wound is smaller with less undermining today. There is still slough present on the surface. Electronic Signature(s) Signed: 11/20/2022 1:35:34 PM By: Fredirick Maudlin MD FACS Entered By: Fredirick Maudlin on 11/20/2022 13:35:34 -------------------------------------------------------------------------------- Physician Orders Details Patient Name: Date of Service: [name] [name] 11/20/2022 12:30 PM Medical Record Number: [id number] Patient Account Number: [id number] Date of Birth/Sex: Treating RN: [birth year] (76 y.o. 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 - Dr. [name] Rm 4 Return A Bathing/ Shower/ Hygiene May shower with protection but do not get wound dressing(s) wet. Negative Presssure Wound Therapy SNAP Vac to wound continuously at 167m/hg pressure Blue Foam Edema Control - Lymphedema / SCD / Other Left Lower Extremity Elevate legs to the level of the heart or above for 30 minutes daily and/or when sitting, a frequency of: Avoid standing for long periods of time. Patient to wear own compression stockings every day. Compression stocking or Garment 20-30 mm/Hg pressure to: - left leg Home Health New wound care orders this week; continue Home Health for wound care. May utilize formulary equivalent dressing for wound treatment orders unless otherwise specified. - Skilled nursing referral for wound care Wound Treatment Wound #1 - Foot Wound Laterality: Dorsal, Right Cleanser: Soap and Water 1 x Per Day/30 Days Discharge Instructions: May shower and wash wound with dial antibacterial soap and water prior to dressing change. SBETRICE, WANAT([id number] Page 4 of 9 Cleanser: Wound Cleanser 1 x Per Day/30 Days Discharge Instructions: Cleanse the wound with wound cleanser prior to applying a clean dressing using gauze sponges, not tissue or cotton balls. Topical: Skintegrity Hydrogel 4 (oz) 1 x Per Day/30 Days Discharge Instructions: Apply  hydrogel as directed Prim Dressing: Promogran Prisma Matrix, 4.34 (sq in) (silver collagen) 1 x Per Day/30 Days ary Discharge Instructions: Moisten collagen with saline or hydrogel Secured With: Transpore Surgical Tape, 2x10 (in/yd) 1 x Per Day/30 Days Discharge Instructions: Secure dressing with tape as directed. Compression Wrap: Kerlix Roll 4.5x3.1 (in/yd) (Generic) 1 x Per Day/30 Days Discharge Instructions: Apply Kerlix and Coban compression as directed. Electronic Signature(s) Signed: 11/20/2022 4:56:20 PM By: CFredirick MaudlinMD FACS Entered By: CFredirick Maudlinon 11/20/2022 13:35:46 -------------------------------------------------------------------------------- Problem List Details Patient Name: Date of Service: SLars MassonRO N H. 11/20/2022 12:30 PM Medical Record Number: [id number] Account Number: [id number] of Birth/Sex: Treating RN: 116-Mar-1947(76y.o. 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 09/20/2022 No Yes without evidence of necrosis L97.812 Non-pressure chronic ulcer of other part of right lower leg with fat layer 09/20/2022 No Yes exposed IJ88.41Chronic systolic (congestive) heart failure 09/20/2022 No Yes M06.9 Rheumatoid arthritis, unspecified 09/20/2022 No Yes Z79.52 Long term (current) use of systemic steroids 09/20/2022 No Yes I70.90 Unspecified atherosclerosis 09/20/2022 No Yes 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 11/20/2022 12:30 PM Medical Record Number: [id number] Account Number: [id number] of Birth/Sex: Treating RN: [birth year](76y.o. F) Primary Care Provider: GKelton PillarOther Clinician: Referring Provider: Treating Provider/Extender: CTonna Cornerin Treatment: 8 Subjective Chief Complaint Information obtained from Patient Patient seen for complaints of Non-Healing Wounds. History of Present Illness (HPI) ADMISSION 09/20/2022 This is a 76year old woman with a past medical history significant for congestive heart failure, atrial fibrillation, rheumatoid arthritis on long-term steroid treatment, CMML currently receiving chemotherapy and moderate malnutrition. She presents to clinic today with a wound on the dorsum of her right foot. She says  it started out as a bruise and then developed a bump which subsequently broke open. She says she was sent to our clinic by her oncologist. She is not diabetic and does not smoke. ABI in clinic today was 1.55. She has had formal segmental arterial Dopplers done, a little over a year ago, which did not show any evidence of occlusive vascular disease. On the dorsal aspect of the right foot, there is a wound with a lot of old hematoma, slough, eschar, and and nonviable subcutaneous tissue. T the best of my o ability to discern, it does appear to involve the muscle layer, but there is no evidence of necrosis. The wound is pouring serous fluid; the patient has 3+ pitting edema to the bilateral lower extremities. 09/29/2022: The patient came to clinic today with nothing but a T overlying her wound. She is accompanied by her husband today. She has not elfa communicated with her cardiologist regarding her fluid  overload. She still has 3+ pitting edema bilaterally. The wound is still pouring serous fluid, although not quite as effusively as at her initial visit. There is still quite a bit of nonviable tissue and hematoma present. 10/13; patient's wound on the right lateral lower leg his eschared over and may well be on its way to healing. The real problem here is on the right dorsal foot just proximal to her toes. The patient is still puzzled by the cause of this. Looking at a picture on the patient's smart phone there is a suggestion that this might have been a hematoma which seems to fit what Dr. [name] had felt on her initial evaluation. They have been using iodoform packing kerlix and Coban. 10/20; right lower leg is healed. We are approved for snap VAC on the right foot. We will try to arrange home health but until then she is going to need a nurse visit on Tuesday 10/20/2022: It has been 2 weeks since I have seen the wound. It has improved significantly since then. She is currently in a snap VAC and I think this has made quite a difference. The overall wound measurements are smaller. There is no massive drainage, as there had been previously. There is some good granulation tissue forming underneath a bit of slough. 10/30/2022: The wound continues to improve with use of the snap VAC. The swelling in her foot is better. The undermining continues to close in. There is a layer of slough overlying good granulation tissue. 11/06/2022: The wound has contracted further. The undermining is still present but the wound surface is better and her edema control is good. 11/13/2022: The wound is smaller again and the undermining has decreased as well. There is slough accumulation over a nice bed of granulation tissue. 11/20/2022: We did not have any snap VAC devices in clinic last week so the wound was just packed. It did well and is smaller with less undermining today. There is still slough present on the  surface. Patient History Information obtained from Patient. Family History Unknown History, Diabetes - Father, Heart Disease - Father, No family history of Cancer. Social History Former smoker - quit 2003, Marital Status - Married, Alcohol Use - Daily, Drug Use - Prior History, Caffeine Use - Daily. Medical History Eyes Denies history of Cataracts, Glaucoma, Optic Neuritis EUNA, ARMON ([id number]) [id number] Page 6 of 9 Ear/Nose/Mouth/Throat Denies history of Chronic sinus problems/congestion, Middle ear problems Respiratory Patient has history of Asthma, Chronic Obstructive Pulmonary Disease (COPD) Cardiovascular Patient has history of Congestive Heart  Failure, Hypertension Denies history of Coronary Artery Disease, Deep Vein Thrombosis, Hypotension, Myocardial Infarction Endocrine Denies history of Type I Diabetes, Type II Diabetes Immunological Denies history of Lupus Erythematosus, Raynaudoos, Scleroderma Integumentary (Skin) Denies history of History of Burn Musculoskeletal Patient has history of Rheumatoid Arthritis Denies history of Gout Neurologic Patient has history of Neuropathy Oncologic Patient has history of Received Chemotherapy - currently receiving Hospitalization/Surgery History - reverse shoulder arthroplasty Rt- 2018. Medical A Surgical History Notes nd Cardiovascular AFIB Genitourinary Stage 3 CKD Objective Constitutional No acute distress. Vitals Time Taken: 12:47 PM, Height: 64 in, Weight: 111 lbs, BMI: 19.1, Temperature: 98.0 F, Pulse: 74 bpm, Respiratory Rate: 16 breaths/min, Blood Pressure: 119/52 mmHg. Respiratory Normal work of breathing on room air. General Notes: 11/20/2022: The wound is smaller with less undermining today. There is still slough present on the surface. Integumentary (Hair, Skin) Wound #1 status is Open. Original cause of wound was Blister. The date acquired was: 08/23/2022. The wound has  been in treatment 8 weeks. The wound is located on the Right,Dorsal Foot. The wound measures 1cm length x 0.8cm width x 0.2cm depth; 0.628cm^2 area and 0.126cm^3 volume. There is Fat Layer (Subcutaneous Tissue) exposed. There is no tunneling noted, however, there is undermining starting at 6:00 and ending at 3:00 with a maximum distance of 1.1cm. There is a medium amount of serosanguineous drainage noted. The wound margin is distinct with the outline attached to the wound base. There is medium (34-66%) pink granulation within the wound bed. There is a medium (34-66%) amount of necrotic tissue within the wound bed including Adherent Slough. The periwound skin appearance had no abnormalities noted for texture. The periwound skin appearance exhibited: Dry/Scaly. The periwound skin appearance did not exhibit: Erythema. Periwound temperature was noted as No Abnormality. 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 There was a Excisional Skin/Subcutaneous Tissue Debridement with INDIGO, BARBIAN ([id number]) [phone].pdf Page 7 of 9 a total area of 0.8 sq cm performed by Fredirick Maudlin, MD. With the following instrument(s): Curette to remove Viable and Non-Viable tissue/material. Material removed includes Subcutaneous Tissue and Slough and after achieving pain control using Lidocaine 4% T opical Solution. No specimens were taken. A time out was conducted at 13:02, prior to the  start of the procedure. A Minimum amount of bleeding was controlled with Pressure. The procedure was tolerated well. Post Debridement Measurements: 1cm length x 0.8cm width x 0.2cm depth; 0.126cm^3 volume. Character of Wound/Ulcer Post Debridement requires further debridement. Post procedure Diagnosis Wound #1: Same as Pre-Procedure General Notes: Scribed for Dr. [name] by Blanche East, RN. Plan Follow-up Appointments: Return Appointment in 1 week. - Dr. [name] Rm 4 Bathing/ Shower/ Hygiene: May shower with protection but do not get wound dressing(s) wet. Negative Presssure Wound Therapy: SNAP Vac to wound continuously at 178m/hg pressure Blue Foam Edema Control - Lymphedema / SCD / Other: Elevate legs to the level of the heart or above for 30 minutes daily and/or when sitting, a frequency of: Avoid standing for long periods of time. Patient to wear own compression stockings every day. Compression stocking or Garment 20-30  mm/Hg pressure to: - left leg Home Health: New wound care orders this week; continue Home Health for wound care. May utilize formulary equivalent dressing for wound treatment orders unless otherwise specified. - Skilled nursing referral for wound care WOUND #1: - Foot Wound Laterality: Dorsal, Right Cleanser: Soap and Water 1 x Per Day/30 Days Discharge Instructions: May shower and wash wound with dial antibacterial soap and water prior to dressing change. Cleanser: Wound Cleanser 1 x Per Day/30 Days Discharge Instructions: Cleanse the wound with wound cleanser prior to applying a clean dressing using gauze sponges, not tissue or cotton balls. Topical: Skintegrity Hydrogel 4 (oz) 1 x Per Day/30 Days Discharge Instructions: Apply hydrogel as directed Prim Dressing: Promogran Prisma Matrix, 4.34 (sq in) (silver collagen) 1 x Per Day/30 Days ary Discharge Instructions: Moisten collagen with saline or hydrogel Secured With: Transpore Surgical T ape, 2x10 (in/yd) 1 x Per  Day/30 Days Discharge Instructions: Secure dressing with tape as directed. Com pression Wrap: Kerlix Roll 4.5x3.1 (in/yd) (Generic) 1 x Per Day/30 Days Discharge Instructions: Apply Kerlix and Coban compression as directed. 11/20/2022: The wound is smaller with less undermining today. There is still slough present on the surface. I used a curette to debride slough and nonviable subcutaneous tissue from the wound. We will continue to apply Prisma silver collagen to the wound bed and use the snap VAC, as we now have devices available in clinic. She will follow-up in 1 week. Electronic Signature(s) Signed: 11/20/2022 1:37:25 PM By: Fredirick Maudlin MD FACS Entered By: Fredirick Maudlin on 11/20/2022 13:37:25 -------------------------------------------------------------------------------- HxROS Details Patient Name: Date of Service: [name] [name] 11/20/2022 12:30 PM Medical Record Number: [id number] Patient Account Number: [id number] Date of Birth/Sex: Treating RN: 23-Mar-1946 (76 y.o. F) Primary Care Provider: [name] Other Clinician: Referring Provider: Treating Provider/Extender: [name] in Treatment: 8 Information Obtained From Patient Eyes Medical History: Negative for: Cataracts; Glaucoma; Optic Neuritis HEDY, GARRO ([id number]) [id number] Page 8 of 9 Ear/Nose/Mouth/Throat Medical History: Negative for: Chronic sinus problems/congestion; Middle ear problems Respiratory Medical History: Positive for: Asthma; Chronic Obstructive Pulmonary Disease (COPD) Cardiovascular Medical History: Positive for: Congestive Heart Failure; Hypertension Negative for: Coronary Artery Disease; Deep Vein Thrombosis; Hypotension; Myocardial Infarction Past Medical History Notes: AFIB Endocrine Medical History: Negative for: Type I Diabetes; Type II Diabetes Genitourinary Medical History: Past Medical History  Notes: Stage 3 CKD Immunological Medical History: Negative for: Lupus Erythematosus; Raynauds; Scleroderma Integumentary (Skin) Medical History: Negative for: History of Burn Musculoskeletal Medical History: Positive for: Rheumatoid Arthritis Negative for: Gout Neurologic Medical History: Positive for: Neuropathy Oncologic Medical History: Positive for: Received Chemotherapy - currently receiving Immunizations Pneumococcal Vaccine: Received Pneumococcal Vaccination: Yes Received Pneumococcal Vaccination On or After 60th Birthday: Yes Implantable Devices None Hospitalization / Surgery History Type of Hospitalization/Surgery reverse shoulder arthroplasty Rt- 2018 Family and Social History Unknown History: Yes; Cancer: No; Diabetes: Yes - Father; Heart Disease: Yes - Father; Former smoker - quit 2003; Marital Status - Married; Alcohol Use: Daily; Drug Use: Prior History; Caffeine Use: Daily; Financial Concerns: No; Food, Clothing or Shelter Needs: No; Support System Lacking: No; Transportation Concerns: No Electronic Signature(s) Signed: 11/20/2022 4:56:20 PM By: Fredirick Maudlin MD FACS Entered By: Fredirick Maudlin on 11/20/2022 13:35:03 Dunnigan, [name] ([id number]) [id number] Page 9 of 9 -------------------------------------------------------------------------------- SuperBill Details Patient Name: Date of Service: CANDI, PROFIT 11/20/2022 Medical Record Number: [id number] Patient Account Number: [id number] Date of Birth/Sex: Treating RN: 1946/10/09 (76 y.o. 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 Facility Procedures : CPT4 Code: 12248250 Description: [facility] - DEB SUBQ TISSUE 20 SQ CM/< ICD-10 Diagnosis Description 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 w Modifier: ithout evidence of n Quantity: 1 ecrosis : CPT4 Code: 03704888 Description: 91694 - WOUND VAC-50 SQ CM OR LESS Modifier: Quantity: 1 Physician Procedures : CPT4 Code Description Modifier [id number] 80034 - WC PHYS LEVEL 4 - EST PT 25 ICD-10 Diagnosis Description 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

## 2022-11-21 ENCOUNTER — Encounter: Payer: Self-pay | Admitting: Hematology

## 2022-11-24 ENCOUNTER — Encounter (HOSPITAL_BASED_OUTPATIENT_CLINIC_OR_DEPARTMENT_OTHER): Payer: Medicare Other | Attending: General Surgery | Admitting: General Surgery

## 2022-11-24 DIAGNOSIS — C931 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: Secondary | ICD-10-CM | POA: Insufficient documentation

## 2022-11-24 DIAGNOSIS — Z7952 Long term (current) use of systemic steroids: Secondary | ICD-10-CM | POA: Diagnosis not present

## 2022-11-24 DIAGNOSIS — L97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 evidence of necrosis: Secondary | ICD-10-CM | POA: Insufficient documentation

## 2022-11-24 DIAGNOSIS — M069 Rheumatoid arthritis, unspecified: Secondary | ICD-10-CM | POA: Diagnosis not present

## 2022-11-24 DIAGNOSIS — L97812 Non-pressure chronic ulcer of other part of right lower leg with fat layer exposed: Secondary | ICD-10-CM | POA: Insufficient documentation

## 2022-11-24 DIAGNOSIS — I5022 Chronic systolic (congestive) heart failure: Secondary | ICD-10-CM | POA: Insufficient documentation

## 2022-11-24 DIAGNOSIS — I7090 Unspecified atherosclerosis: Secondary | ICD-10-CM | POA: Insufficient documentation

## 2022-11-24 DIAGNOSIS — I130 Hypertensive heart and chronic kidney disease with heart failure and stage 1 through stage 4 chronic kidney disease, or unspecified chronic kidney disease: Secondary | ICD-10-CM | POA: Insufficient documentation

## 2022-11-24 DIAGNOSIS — N183 Chronic kidney disease, stage 3 unspecified: Secondary | ICD-10-CM | POA: Insufficient documentation

## 2022-11-24 NOTE — Progress Notes (Signed)
ELANOR, CALE ([id number]) [id number] Page 1 of 4 Visit Report for 11/24/2022 Arrival Information Details Patient Name: Date of Service: [name], [name] 11/24/2022 7:45 A M Medical Record Number: [id number] Patient Account Number: [id number] Date of Birth/Sex: Treating RN: 02/09/1946 (76 y.o. [name] Primary Care [name]: [name] Other Clinician: Referring [name]: Treating [name]/Extender: [name] in Treatment: 9 Visit Information History Since Last Visit Added or deleted any medications: No Patient Arrived: Wheel Chair Any new allergies or adverse reactions: No Arrival Time: 07:48 Had a fall or experienced change in No Accompanied By: husband activities of daily living that may affect Transfer Assistance: None risk of falls: Patient Identification Verified: Yes Signs or symptoms of abuse/neglect since last visito No Secondary Verification Process Completed: Yes Hospitalized since last visit: No Patient Requires Transmission-Based Precautions: No Implantable device outside of the clinic excluding No Patient Has Alerts: No cellular tissue based products placed in the center since last visit: Has Dressing in Place as Prescribed: Yes Pain Present Now: No Electronic Signature(s) Signed: 11/24/2022 3:37:43 PM By: [name] Entered By: [name] on 11/24/2022 07:49:06 -------------------------------------------------------------------------------- Encounter Discharge Information Details Patient Name: Date of Service: Sherry Masson RO N [name]. 11/24/2022 7:45 A M Medical Record Number: [id number] Patient Account Number: [id number] Date of Birth/Sex: Treating RN: 1946/02/26 (76 y.o. [name] Primary Care Jafet Wissing: [name] Other Clinician: Referring [name]: Treating [name]/Extender: [name] in Treatment: 9 Encounter  Discharge Information Items Discharge Condition: Stable Ambulatory Status: Wheelchair Discharge Destination: Home Transportation: Private Auto Accompanied By: husband Schedule Follow-up Appointment: Yes Clinical Summary of Care: Patient Declined Electronic Signature(s) Signed: 11/24/2022 3:37:43 PM By: [name] Entered By: [name] on 11/24/2022 08:31:22 [name], [name] ([id number]) [id number] Page 2 of 4 -------------------------------------------------------------------------------- Negative Pressure Wound Therapy Maintenance (NPWT) Details Patient Name: Date of Service: [name], [name] 11/24/2022 7:45 A M Medical Record Number: [id number] Patient Account Number: [id number] Date of Birth/Sex: Treating RN: [birth year] (76 y.o. [name] Primary Care [name]: [name] Other Clinician: Referring [name]: Treating [name]/Extender: [name] in Treatment: 9 NPWT Maintenance Performed for: Wound #1 Right, Dorsal Foot Performed By: Sherry Peals, RN Type: VAC System Coverage Size (sq cm): 0.8 Pressure Type: Constant Pressure Setting: 125 mmHG Drain Type: Channel Primary Contact: Other : Sponge/Dressing Type: Foam, Blue Date Initiated: 10/13/2022 Dressing Removed: Yes Quantity of Sponges/Gauze Removed: 1 Canister Changed: No Dressing Reapplied: Yes Quantity of Sponges/Gauze Inserted: 1 Days On NPWT : 37 Electronic Signature(s) Signed: 11/24/2022 3:37:43 PM By: [name] Entered By: [name] on 11/24/2022 08:30:40 -------------------------------------------------------------------------------- Patient/Caregiver Education Details Patient Name: Date of Service: [name] 12/1/2023andnbsp7:45 A M Medical Record Number: [id number] Patient Account Number: [id number] Date of Birth/Gender: Treating RN: [birth year] (76 y.o. [name] Primary Care Physician: [name] Other Clinician: Referring Physician: Treating Physician/Extender: [name] in Treatment: 9 Education Assessment Education Provided To: Patient Education Topics Provided Safety: Methods: Explain/Verbal Responses: Reinforcements needed, State content correctly Electronic Signature(s) Signed: 11/24/2022 3:37:43 PM By: [name] Entered By: [name] on 11/24/2022 08:31:00 [name], [name] ([id number]) [id number] Page 3 of 4 -------------------------------------------------------------------------------- Wound Assessment Details Patient Name: Date of Service: [name], [name] 11/24/2022 7:45 A M Medical Record Number: [id number] Patient Account Number: [id number] Date of Birth/Sex: Treating RN: [birth year] (76 y.o. [name] Primary Care [name]: [name] Other Clinician: Referring Dashana Guizar: Treating Sherry Palazzi/Extender: [name]  [name] Weeks in Treatment: 9 Wound Status Wound Number: 1 Primary Lymphedema Etiology: Wound Location: Right, Dorsal Foot Wound Open Wounding Event: Blister Status: Date Acquired: 08/23/2022 Comorbid Asthma, Chronic Obstructive Pulmonary Disease (COPD), Weeks Of Treatment: 9 History: Congestive Heart Failure, Hypertension, Rheumatoid Arthritis, Clustered Wound: No Neuropathy, Received Chemotherapy Wound Measurements Length: (cm) 1 Width: (cm) 0.8 Depth: (cm) 0.2 Area: (cm) 0.628 Volume: (cm) 0.126 % Reduction in Area: 39.4% % Reduction in Volume: 79.7% Epithelialization: None Wound Description Classification: Full Thickness Without Exposed Suppor Wound Margin: Distinct, outline attached Exudate Amount: Medium Exudate Type: Serosanguineous Exudate Color: red, brown t Structures Foul Odor After Cleansing: No Slough/Fibrino Yes Wound Bed Granulation Amount: Medium (34-66%) Exposed  Structure Granulation Quality: Pink Fascia Exposed: No Necrotic Amount: Medium (34-66%) Fat Layer (Subcutaneous Tissue) Exposed: Yes Necrotic Quality: Adherent Slough Tendon Exposed: No Muscle Exposed: No Joint Exposed: No Bone Exposed: No Periwound Skin Texture Texture Color No Abnormalities Noted: Yes No Abnormalities Noted: No Erythema: No Moisture No Abnormalities Noted: No Temperature / Pain Dry / Scaly: Yes Temperature: No Abnormality Treatment Notes Wound #1 (Foot) Wound Laterality: Dorsal, Right Cleanser Soap and Water Discharge Instruction: May shower and wash wound with dial antibacterial soap and water prior to dressing change. Wound Cleanser Discharge Instruction: Cleanse the wound with wound cleanser prior to applying a clean dressing using gauze sponges, not tissue or cotton balls. Peri-Wound Care Topical Skintegrity Hydrogel 4 (oz) Discharge Instruction: Apply hydrogel as directed Primary Dressing Promogran Prisma Matrix, 4.34 (sq in) (silver collagen) Discharge Instruction: Moisten collagen with saline or hydrogel [name], [name] ([id number]) [phone] Page 4 of 4 Secondary Dressing Secured With Transpore Surgical Tape, 2x10 (in/yd) Discharge Instruction: Secure dressing with tape as directed. Compression Wrap Kerlix Roll 4.5x3.1 (in/yd) Discharge Instruction: Apply Kerlix and Coban compression as directed. Compression Stockings Add-Ons Electronic Signature(s) Signed: 11/24/2022 3:37:43 PM By: [name] Entered By: [name] on 11/24/2022 07:53:20 -------------------------------------------------------------------------------- Vitals Details Patient Name: Date of Service: Sherry Masson RO N [name]. 11/24/2022 7:45 A M Medical Record Number: [id number] Patient Account Number: [id number] Date of Birth/Sex: Treating RN: 10/01/1946 (76 y.o. [name] Primary Care Rhegan Trunnell: [name] Other  Clinician: Referring Stephana Morell: Treating Kristy Schomburg/Extender: [name] in Treatment: 9 Vital Signs Time Taken: 07:52 Temperature (F): 98.2 Height (in): 64 Pulse (bpm): 110 Weight (lbs): 111 Respiratory Rate (breaths/min): 16 Body Mass Index (BMI): 19.1 Blood Pressure (mmHg): 141/79 Reference Range: 80 - 120 mg / dl Electronic Signature(s) Signed: 11/24/2022 3:37:43 PM By: [name] Entered By: [name] on 11/24/2022 07:53:05

## 2022-11-24 NOTE — Progress Notes (Signed)
KEYARI, KLEEMAN ([id number]) [id number] Page 1 of 1 Visit Report for 11/24/2022 SuperBill Details Patient Name: Date of Service: [name], [name] 11/24/2022 Medical Record Number: [id number] Patient Account Number: [id number] Date of Birth/Sex: Treating RN: July 29, 1946 (76 y.o. 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

## 2022-11-28 ENCOUNTER — Inpatient Hospital Stay: Payer: Medicare Other | Attending: Nurse Practitioner

## 2022-11-28 DIAGNOSIS — C931 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: Secondary | ICD-10-CM | POA: Diagnosis not present

## 2022-11-28 DIAGNOSIS — Z5111 Encounter for antineoplastic chemotherapy: Secondary | ICD-10-CM | POA: Diagnosis not present

## 2022-11-28 LAB — CMP (CANCER CENTER ONLY)
ALT: 23 U/L (ref 0–44)
AST: 19 U/L (ref 15–41)
Albumin: 3.8 g/dL (ref 3.5–5.0)
Alkaline Phosphatase: 55 U/L (ref 38–126)
Anion gap: 10 (ref 5–15)
BUN: 17 mg/dL (ref 8–23)
CO2: 30 mmol/L (ref 22–32)
Calcium: 9.5 mg/dL (ref 8.9–10.3)
Chloride: 100 mmol/L (ref 98–111)
Creatinine: 0.94 mg/dL (ref 0.44–1.00)
GFR, Estimated: >60 mL/min (ref >60)
Glucose, Bld: 96 mg/dL (ref 70–99)
Potassium: 3.6 mmol/L (ref 3.5–5.1)
Sodium: 140 mmol/L (ref 135–145)
Total Bilirubin: 1 mg/dL (ref 0.3–1.2)
Total Protein: 5.6 g/dL — ABNORMAL LOW (ref 6.5–8.1)

## 2022-11-28 LAB — CBC WITH DIFFERENTIAL/PLATELET
Abs Immature Granulocytes: 0.1 10*3/uL — ABNORMAL HIGH (ref 0.00–0.07)
Basophils Absolute: 0.1 10*3/uL (ref 0.0–0.1)
Basophils Relative: 1 %
Eosinophils Absolute: 0.2 10*3/uL (ref 0.0–0.5)
Eosinophils Relative: 2 %
HCT: 33.9 % — ABNORMAL LOW (ref 36.0–46.0)
Hemoglobin: 11.1 g/dL — ABNORMAL LOW (ref 12.0–15.0)
Immature Granulocytes: 1 %
Lymphocytes Relative: 10 %
Lymphs Abs: 0.9 10*3/uL (ref 0.7–4.0)
MCH: 35.6 pg — ABNORMAL HIGH (ref 26.0–34.0)
MCHC: 32.7 g/dL (ref 30.0–36.0)
MCV: 108.7 fL — ABNORMAL HIGH (ref 80.0–100.0)
Monocytes Absolute: 1 10*3/uL (ref 0.1–1.0)
Monocytes Relative: 10 %
Neutro Abs: 7.4 10*3/uL (ref 1.7–7.7)
Neutrophils Relative %: 76 %
Platelets: 72 10*3/uL — ABNORMAL LOW (ref 150–400)
RBC: 3.12 MIL/uL — ABNORMAL LOW (ref 3.87–5.11)
RDW: 15.1 % (ref 11.5–15.5)
WBC: 9.7 10*3/uL (ref 4.0–10.5)
nRBC: 0 % (ref 0.0–0.2)

## 2022-11-29 ENCOUNTER — Encounter (HOSPITAL_BASED_OUTPATIENT_CLINIC_OR_DEPARTMENT_OTHER): Payer: Medicare Other | Admitting: General Surgery

## 2022-11-29 DIAGNOSIS — L97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 evidence of necrosis: Secondary | ICD-10-CM | POA: Diagnosis not present

## 2022-11-29 DIAGNOSIS — L97812 Non-pressure chronic ulcer of other part of right lower leg with fat layer exposed: Secondary | ICD-10-CM | POA: Diagnosis not present

## 2022-11-29 DIAGNOSIS — M069 Rheumatoid arthritis, unspecified: Secondary | ICD-10-CM | POA: Diagnosis not present

## 2022-11-29 DIAGNOSIS — N183 Chronic kidney disease, stage 3 unspecified: Secondary | ICD-10-CM | POA: Diagnosis not present

## 2022-11-29 DIAGNOSIS — I130 Hypertensive heart and chronic kidney disease with heart failure and stage 1 through stage 4 chronic kidney disease, or unspecified chronic kidney disease: Secondary | ICD-10-CM | POA: Diagnosis not present

## 2022-11-29 DIAGNOSIS — I5022 Chronic systolic (congestive) heart failure: Secondary | ICD-10-CM | POA: Diagnosis not present

## 2022-11-29 DIAGNOSIS — L97512 Non-pressure chronic ulcer of other part of right foot with fat layer exposed: Secondary | ICD-10-CM | POA: Diagnosis not present

## 2022-11-29 DIAGNOSIS — I890 Lymphedema, not elsewhere classified: Secondary | ICD-10-CM | POA: Diagnosis not present

## 2022-11-30 DIAGNOSIS — N1831 Chronic kidney disease, stage 3a: Secondary | ICD-10-CM | POA: Diagnosis not present

## 2022-11-30 DIAGNOSIS — K900 Celiac disease: Secondary | ICD-10-CM | POA: Diagnosis not present

## 2022-11-30 DIAGNOSIS — D509 Iron deficiency anemia, unspecified: Secondary | ICD-10-CM | POA: Diagnosis not present

## 2022-11-30 DIAGNOSIS — M069 Rheumatoid arthritis, unspecified: Secondary | ICD-10-CM | POA: Diagnosis not present

## 2022-11-30 DIAGNOSIS — D693 Immune thrombocytopenic purpura: Secondary | ICD-10-CM | POA: Diagnosis not present

## 2022-11-30 DIAGNOSIS — J449 Chronic obstructive pulmonary disease, unspecified: Secondary | ICD-10-CM | POA: Diagnosis not present

## 2022-11-30 DIAGNOSIS — E7800 Pure hypercholesterolemia, unspecified: Secondary | ICD-10-CM | POA: Diagnosis not present

## 2022-11-30 DIAGNOSIS — Z0000 Encounter for general adult medical examination without abnormal findings: Secondary | ICD-10-CM | POA: Diagnosis not present

## 2022-11-30 DIAGNOSIS — M810 Age-related osteoporosis without current pathological fracture: Secondary | ICD-10-CM | POA: Diagnosis not present

## 2022-11-30 DIAGNOSIS — I129 Hypertensive chronic kidney disease with stage 1 through stage 4 chronic kidney disease, or unspecified chronic kidney disease: Secondary | ICD-10-CM | POA: Diagnosis not present

## 2022-11-30 DIAGNOSIS — D638 Anemia in other chronic diseases classified elsewhere: Secondary | ICD-10-CM | POA: Diagnosis not present

## 2022-11-30 DIAGNOSIS — I480 Paroxysmal atrial fibrillation: Secondary | ICD-10-CM | POA: Diagnosis not present

## 2022-11-30 DIAGNOSIS — R634 Abnormal weight loss: Secondary | ICD-10-CM | POA: Diagnosis not present

## 2022-11-30 NOTE — Progress Notes (Addendum)
KAI, RAILSBACK ([id number]) [id number] Page 1 of 8 Visit Report for 11/29/2022 Arrival Information Details Patient Name: Date of Service: [name], [name] 11/29/2022 3:15 PM Medical Record Number: [id number] Patient Account Number: [id number] Date of Birth/Sex: Treating RN: September 16, 1946 (76 y.o. Marta Lamas Primary Care Ellizabeth Dacruz: [name] Other Clinician: Referring Pecola Haxton: Treating Jailon Schaible/Extender: [name] in Treatment: 10 Visit Information History Since Last Visit Added or deleted any medications: No Patient Arrived: Wheel Chair Any new allergies or adverse reactions: No Arrival Time: 15:20 Had a fall or experienced change in No Accompanied By: husband activities of daily living that may affect Transfer Assistance: None risk of falls: Patient Identification Verified: Yes Signs or symptoms of abuse/neglect since last visito No Secondary Verification Process Completed: Yes Hospitalized since last visit: No Patient Requires Transmission-Based Precautions: No Implantable device outside of the clinic excluding No Patient Has Alerts: No cellular tissue based products placed in the center since last visit: Has Footwear/Offloading in Place as Prescribed: Yes Right: Other:surgical shoe Pain Present Now: No Electronic Signature(s) Signed: 11/30/2022 4:16:33 PM By: Blanche East RN Previous Signature: 11/29/2022 4:13:08 PM Version By: Blanche East RN Entered By: Blanche East on 11/29/2022 16:35:49 -------------------------------------------------------------------------------- Encounter Discharge Information Details Patient Name: Date of Service: [name]. 11/29/2022 3:15 PM Medical Record Number: [id number] Patient Account Number: [id number] Date of Birth/Sex: Treating RN: [birth year] (76 y.o. Marta Lamas Primary Care Jackie Littlejohn: [name] Other Clinician: Referring Chatham Howington: Treating  Marcy Bogosian/Extender: [name] in Treatment: 10 Encounter Discharge Information Items Post Procedure Vitals Discharge Condition: Stable Temperature (F): 97.5 Ambulatory Status: Wheelchair Pulse (bpm): 88 Discharge Destination: Home Respiratory Rate (breaths/min): 18 Transportation: Other Blood Pressure (mmHg): 131/74 Accompanied By: self Schedule Follow-up Appointment: Yes Clinical Summary of Care: Electronic Signature(s) Signed: 11/29/2022 4:13:08 PM By: Blanche East RN Entered By: Blanche East on 11/29/2022 16:08:38 [place], [name] ([id number]) [id number] Page 2 of 8 -------------------------------------------------------------------------------- Lower Extremity Assessment Details Patient Name: Date of Service: [name] 11/29/2022 3:15 PM Medical Record Number: [id number] Patient Account Number: [id number] Date of Birth/Sex: Treating RN: Jul 19, 1946 (76 y.o. Marta Lamas Primary Care Bradlee Heitman: [name] Other Clinician: Referring Kelleen Stolze: Treating Hamp Moreland/Extender: [name] in Treatment: 10 Edema Assessment Assessed: [Left: No] [Right: No] [Left: Edema] [Right: :] Calf Left: Right: Point of Measurement: From Medial Instep 27.5 cm Ankle Left: Right: Point of Measurement: From Medial Instep 19.5 cm Vascular Assessment Pulses: Dorsalis Pedis Palpable: [Right:Yes] Electronic Signature(s) Signed: 11/29/2022 4:13:08 PM By: Blanche East RN Entered By: Blanche East on 11/29/2022 15:25:32 -------------------------------------------------------------------------------- Multi Wound Chart Details Patient Name: Date of Service: [name]. 11/29/2022 3:15 PM Medical Record Number: [id number] Patient Account Number: [id number] Date of Birth/Sex: Treating RN: May 19, 1946 (75 y.o. F) Primary Care Dioselina Brumbaugh: [name] Other Clinician: Referring  Lizmary Nader: Treating Zamzam Whinery/Extender: [name] in Treatment: 10 Vital Signs Height(in): 64 Pulse(bpm): 88 Weight(lbs): 111 Blood Pressure(mmHg): 131/74 Body Mass Index(BMI): 19.1 Temperature(F): 97.5 Respiratory Rate(breaths/min): 18 [1:Photos:] [N/A:N/A] Right, Dorsal Foot N/A N/A Wound Location: Blister N/A N/A Wounding Event: LASHUNTA, FRIEDEN ([id number]) [id number] Page 3 of 8 Lymphedema N/A N/A Primary Etiology: Asthma, Chronic Obstructive N/A N/A Comorbid History: Pulmonary Disease (COPD), Congestive Heart Failure, Hypertension, Rheumatoid Arthritis, Neuropathy, Received Chemotherapy 08/23/2022 N/A N/A Date Acquired: 10 N/A N/A Weeks of Treatment: Open N/A N/A Wound Status: No N/A N/A Wound Recurrence: 0.9x0.7x0.2 N/A N/A Measurements L x W x D (  cm) 0.495 N/A N/A A (cm) : rea 0.099 N/A N/A Volume (cm) : 52.30% N/A N/A % Reduction in A rea: 84.10% N/A N/A % Reduction in Volume: Full Thickness Without Exposed N/A N/A Classification: Support Structures Medium N/A N/A Exudate A mount: Serosanguineous N/A N/A Exudate Type: red, brown N/A N/A Exudate Color: Distinct, outline attached N/A N/A Wound Margin: Medium (34-66%) N/A N/A Granulation A mount: Pink N/A N/A Granulation Quality: Medium (34-66%) N/A N/A Necrotic A mount: Fat Layer (Subcutaneous Tissue): Yes N/A N/A Exposed Structures: Fascia: No Tendon: No Muscle: No Joint: No Bone: No None N/A N/A Epithelialization: Debridement - Selective/Open Wound N/A N/A Debridement: Pre-procedure Verification/Time Out 15:34 N/A N/A Taken: Lidocaine 5% topical ointment N/A N/A Pain Control: Slough N/A N/A Tissue Debrided: Non-Viable Tissue N/A N/A Level: 0.63 N/A N/A Debridement A (sq cm): rea Curette N/A N/A Instrument: Minimum N/A N/A Bleeding: Pressure N/A N/A Hemostasis A chieved: Procedure was tolerated well N/A  N/A Debridement Treatment Response: 0.9x0.7x0.2 N/A N/A Post Debridement Measurements L x W x D (cm) 0.099 N/A N/A Post Debridement Volume: (cm) No Abnormalities Noted N/A N/A Periwound Skin Texture: Dry/Scaly: Yes N/A N/A Periwound Skin Moisture: Erythema: No N/A N/A Periwound Skin Color: No Abnormality N/A N/A Temperature: Debridement N/A N/A Procedures Performed: Negative Pressure Wound Therapy Maintenance (NPWT) Treatment Notes Wound #1 (Foot) Wound Laterality: Dorsal, Right Cleanser Soap and Water Discharge Instruction: May shower and wash wound with dial antibacterial soap and water prior to dressing change. Wound Cleanser Discharge Instruction: Cleanse the wound with wound cleanser prior to applying a clean dressing using gauze sponges, not tissue or cotton balls. Peri-Wound Care Topical Skintegrity Hydrogel 4 (oz) Discharge Instruction: Apply hydrogel as directed Primary Dressing Promogran Prisma Matrix, 4.34 (sq in) (silver collagen) Discharge Instruction: Moisten collagen with saline or hydrogel Secondary Dressing Secured With Transpore Surgical Tape, 2x10 (in/yd) Discharge Instruction: Secure dressing with tape as directed. Compression Wrap Kerlix Roll 4.5x3.1 (in/yd) Discharge Instruction: Apply Kerlix and Coban compression as directed. CHRYSTINE, FROGGE ([id number]) [id number] Page 4 of 8 Compression Stockings Add-Ons Electronic Signature(s) Signed: 11/29/2022 5:00:06 PM By: Fredirick Maudlin MD FACS Entered By: Fredirick Maudlin on 11/29/2022 17:00:06 -------------------------------------------------------------------------------- Multi-Disciplinary Care Plan Details Patient Name: Date of Service: KOSISOCHUKWU, GOLDBERG [name] 11/29/2022 3:15 PM Medical Record Number: [id number] Patient Account Number: [id number] Date of Birth/Sex: Treating RN: [birth year] (76 y.o. Marta Lamas Primary Care Ledger Heindl: [name] Other  Clinician: Referring Leslye Puccini: Treating Karrine Kluttz/Extender: [name] in Treatment: 10 Active Inactive Peripheral Neuropathy Nursing Diagnoses: Potential alteration in peripheral tissue perfusion (select prior to confirmation of diagnosis) Goals: Patient/caregiver will verbalize understanding of disease process and disease management Date Initiated: 09/20/2022 Target Resolution Date: 01/17/2023 Goal Status: Active Interventions: Provide education on Management of Neuropathy and Related Ulcers Treatment Activities: Patient referred for customized footwear/offloading : 09/20/2022 Notes: Wound/Skin Impairment Nursing Diagnoses: Knowledge deficit related to ulceration/compromised skin integrity Goals: Ulcer/skin breakdown will have a volume reduction of 30% by week 4 Date Initiated: 09/20/2022 Date Inactivated: 11/29/2022 Target Resolution Date: 11/30/2022 Goal Status: Met Ulcer/skin breakdown will have a volume reduction of 50% by week 8 Date Initiated: 11/29/2022 Target Resolution Date: 01/24/2023 Goal Status: Active Interventions: Assess ulceration(s) every visit Treatment Activities: Skin care regimen initiated : 09/20/2022 Topical wound management initiated : 09/20/2022 Notes: Electronic Signature(s) Signed: 11/29/2022 4:13:08 PM By: Blanche East RN Entered By: Blanche East on 11/29/2022 16:00:24 [name] ([id number]) [id number] Page 5 of 8 -------------------------------------------------------------------------------- Negative Pressure Wound Therapy Maintenance (NPWT)  Details Patient Name: Date of Service: ISSIS, LINDSETH 11/29/2022 3:15 PM Medical Record Number: [id number] Patient Account Number: [id number] Date of Birth/Sex: Treating RN: [birth year] (76 y.o. Marta Lamas Primary Care Ashlin Hidalgo: [name] Other Clinician: Referring Talaya Lamprecht: Treating Ami Thornsberry/Extender: [name] in Treatment: 10 NPWT Maintenance Performed for: Wound #1 Right, Dorsal Foot Performed By: Blanche East, RN Type: VAC System Coverage Size (sq cm): 0.63 Pressure Type: Constant Pressure Setting: 125 mmHG Drain Type: Channel Primary Contact: Other : prisma Sponge/Dressing Type: Foam, Blue Date Initiated: 10/13/2022 Dressing Removed: No Quantity of Sponges/Gauze Removed: 1 Canister Changed: No Canister Exudate Volume: 10 Dressing Reapplied: No Quantity of Sponges/Gauze Inserted: 1 Respones T Treatment: o pt tolerated well Days On NPWT : 48 Post Procedure Diagnosis Same as Pre-procedure Electronic Signature(s) Signed: 11/29/2022 4:13:08 PM By: Blanche East RN Entered By: Blanche East on 11/29/2022 15:36:53 -------------------------------------------------------------------------------- Pain Assessment Details Patient Name: Date of Service: BETTYMAE, YOTT [name] 11/29/2022 3:15 PM Medical Record Number: [id number] Patient Account Number: [id number] Date of Birth/Sex: Treating RN: [birth year] (76 y.o. Marta Lamas Primary Care Kerolos Nehme: [name] Other Clinician: Referring Ardelia Wrede: Treating Keyla Milone/Extender: [name] in Treatment: 10 Active Problems Location of Pain Severity and Description of Pain Patient Has Paino No Site Locations [place], [place] ([id number]) [id number] Page 6 of 8 Pain Management and Medication Current Pain Management: Electronic Signature(s) Signed: 11/29/2022 4:13:08 PM By: Blanche East RN Entered By: Blanche East on 11/29/2022 15:22:15 -------------------------------------------------------------------------------- Patient/Caregiver Education Details Patient Name: Date of Service: Alroy Dust 12/6/2023andnbsp3:15 PM Medical Record Number: [id number] Patient Account Number: [id number] Date of Birth/Gender: Treating RN: February 03, 1946 (76 y.o. Marta Lamas Primary Care Physician: [name] Other Clinician: Referring Physician: Treating Physician/Extender: [name] in Treatment: 10 Education Assessment Education Provided To: Patient Education Topics Provided Peripheral Neuropathy: Methods: Explain/Verbal Responses: Reinforcements needed, State content correctly Wound/Skin Impairment: Methods: Explain/Verbal Responses: Reinforcements needed, State content correctly Electronic Signature(s) Signed: 11/29/2022 4:13:08 PM By: Blanche East RN Entered By: Blanche East on 11/29/2022 16:01:57 -------------------------------------------------------------------------------- Wound Assessment Details Patient Name: Date of Service: Sherry Masson [name] 11/29/2022 3:15 PM [name] ([id number]) [id number] Page 7 of 8 Medical Record Number: [id number] Patient Account Number: [id number] Date of Birth/Sex: Treating RN: February 25, 1946 (76 y.o. [name], [name] Primary Care Jerlisa Diliberto: [name] Other Clinician: Referring Bristal Steffy: Treating Sherrell Farish/Extender: [name] in Treatment: 10 Wound Status Wound Number: 1 Primary Lymphedema Etiology: Wound Location: Right, Dorsal Foot Wound Open Wounding Event: Blister Status: Date Acquired: 08/23/2022 Comorbid Asthma, Chronic Obstructive Pulmonary Disease (COPD), Weeks Of Treatment: 10 History: Congestive Heart Failure, Hypertension, Rheumatoid Arthritis, Clustered Wound: No Neuropathy, Received Chemotherapy Photos Wound Measurements Length: (cm) 0.9 Width: (cm) 0.7 Depth: (cm) 0.2 Area: (cm) 0.495 Volume: (cm) 0.099 % Reduction in Area: 52.3% % Reduction in Volume: 84.1% Epithelialization: None Tunneling: No Undermining: No Wound Description Classification: Full Thickness Without Exposed Support Structures Wound Margin: Distinct, outline attached Exudate Amount: Medium Exudate Type:  Serosanguineous Exudate Color: red, brown Foul Odor After Cleansing: No Slough/Fibrino Yes Wound Bed Granulation Amount: Medium (34-66%) Exposed Structure Granulation Quality: Pink Fascia Exposed: No Necrotic Amount: Medium (34-66%) Fat Layer (Subcutaneous Tissue) Exposed: Yes Necrotic Quality: Adherent Slough Tendon Exposed: No Muscle Exposed: No Joint Exposed: No Bone Exposed: No Periwound Skin Texture Texture Color No Abnormalities Noted: Yes No Abnormalities Noted: No Erythema: No Moisture No Abnormalities Noted: No Temperature / Pain Dry /  Scaly: Yes Temperature: No Abnormality Treatment Notes Wound #1 (Foot) Wound Laterality: Dorsal, Right Cleanser Soap and Water Discharge Instruction: May shower and wash wound with dial antibacterial soap and water prior to dressing change. Wound Cleanser Discharge Instruction: Cleanse the wound with wound cleanser prior to applying a clean dressing using gauze sponges, not tissue or cotton balls. Peri-Wound Care Topical Skintegrity Hydrogel 4 (oz) Discharge Instruction: Apply hydrogel as directed LEAFY, MOTSINGER ([id number]) [phone].pdf Page 8 of 8 Primary Dressing Promogran Prisma Matrix, 4.34 (sq in) (silver collagen) Discharge Instruction: Moisten collagen with saline or hydrogel Secondary Dressing Secured With Transpore Surgical Tape, 2x10 (in/yd) Discharge Instruction: Secure dressing with tape as directed. Compression Wrap Kerlix Roll 4.5x3.1 (in/yd) Discharge Instruction: Apply Kerlix and Coban compression as directed. Compression Stockings Add-Ons Electronic Signature(s) Signed: 11/29/2022 4:13:08 PM By: Blanche East RN Entered By: Blanche East on 11/29/2022 15:30:02 -------------------------------------------------------------------------------- Vitals Details Patient Name: Date of Service: Sherry Masson [name] 11/29/2022 3:15 PM Medical Record Number: [id number] Patient Account  Number: [id number] Date of Birth/Sex: Treating RN: [birth year] (76 y.o. [name], [name] Primary Care Sebastain Fishbaugh: [name] Other Clinician: Referring Yumiko Alkins: Treating Broox Lonigro/Extender: [name] in Treatment: 10 Vital Signs Time Taken: 15:21 Temperature (F): 97.5 Height (in): 64 Pulse (bpm): 88 Weight (lbs): 111 Respiratory Rate (breaths/min): 18 Body Mass Index (BMI): 19.1 Blood Pressure (mmHg): 131/74 Reference Range: 80 - 120 mg / dl Electronic Signature(s) Signed: 11/29/2022 4:13:08 PM By: Blanche East RN Entered By: Blanche East on 11/29/2022 15:22:07

## 2022-11-30 NOTE — Progress Notes (Signed)
[place], [place] ([id number]) [id number] Page 1 of 9 Visit Report for 11/29/2022 Chief Complaint Document Details Patient Name: Date of Service: [name], [name] 11/29/2022 3:15 PM Medical Record Number: [id number] Patient Account Number: [id number] Date of Birth/Sex: Treating RN: 09-04-1946 (76 y.o. F) Primary Care Provider: [name] Other Clinician: Referring Provider: Treating Provider/Extender: [name] in Treatment: 10 Information Obtained from: Patient Chief Complaint Patient seen for complaints of Non-Healing Wounds. Electronic Signature(s) Signed: 11/29/2022 5:00:12 PM By: Fredirick Maudlin MD FACS Entered By: Fredirick Maudlin on 11/29/2022 17:00:12 -------------------------------------------------------------------------------- Debridement Details Patient Name: Date of Service: Sherry Masson RO N [name]. 11/29/2022 3:15 PM Medical Record Number: [id number] Patient Account Number: [id number] Date of Birth/Sex: Treating RN: 16-Nov-1946 (76 y.o. [name], [name] Primary Care Provider: [name] Other Clinician: Referring Provider: Treating Provider/Extender: [name] in Treatment: 10 Debridement Performed for Assessment: Wound #1 Right,Dorsal Foot Performed By: Physician Fredirick Maudlin, MD Debridement Type: Debridement Level of Consciousness (Pre-procedure): Awake and Alert Pre-procedure Verification/Time Out Yes - 15:34 Taken: Start Time: 15:34 Pain Control: Lidocaine 5% topical ointment T Area Debrided (L x W): otal 0.9 (cm) x 0.7 (cm) = 0.63 (cm) Tissue and other material debrided: Non-Viable, Slough, Slough Level: Non-Viable Tissue Debridement Description: Selective/Open Wound Instrument: Curette Bleeding: Minimum Hemostasis Achieved: Pressure Response to Treatment: Procedure was tolerated well Level of Consciousness (Post- Awake and Alert procedure): Post  Debridement Measurements of Total Wound Length: (cm) 0.9 Width: (cm) 0.7 Depth: (cm) 0.2 Volume: (cm) 0.099 Character of Wound/Ulcer Post Debridement: Requires Further Debridement Post Procedure Diagnosis Same as Pre-procedure [name], [name] ([id number]) [id number] Page 2 of 9 Notes Scribed for Dr. [name] by Blanche East, RN Electronic Signature(s) Signed: 11/29/2022 4:13:08 PM By: Blanche East RN Signed: 11/29/2022 5:07:18 PM By: Fredirick Maudlin MD FACS Entered By: Blanche East on 11/29/2022 15:36:00 -------------------------------------------------------------------------------- HPI Details Patient Name: Date of Service: Sherry Masson RO N [name]. 11/29/2022 3:15 PM Medical Record Number: [id number] Patient Account Number: [id number] Date of Birth/Sex: Treating RN: 05-24-1946 (76 y.o. F) Primary Care Provider: [name] Other Clinician: Referring Provider: Treating Provider/Extender: [name] in Treatment: 10 History of Present Illness HPI Description: ADMISSION 09/20/2022 This is a 76 year old woman with a past medical history significant for congestive heart failure, atrial fibrillation, rheumatoid arthritis on long-term steroid treatment, CMML currently receiving chemotherapy and moderate malnutrition. She presents to clinic today with a wound on the dorsum of her right foot. She says it started out as a bruise and then developed a bump which subsequently broke open. She says she was sent to our clinic by her oncologist. She is not diabetic and does not smoke. ABI in clinic today was 1.55. She has had formal segmental arterial Dopplers done, a little over a year ago, which did not show any evidence of occlusive vascular disease. On the dorsal aspect of the right foot, there is a wound with a lot of old hematoma, slough, eschar, and and nonviable subcutaneous tissue. T the best of my o ability to discern, it does  appear to involve the muscle layer, but there is no evidence of necrosis. The wound is pouring serous fluid; the patient has 3+ pitting edema to the bilateral lower extremities. 09/29/2022: The patient came to clinic today with nothing but a T overlying her wound. She is accompanied by her husband today. She has not elfa communicated with her cardiologist regarding her fluid overload. She still has 3+ pitting edema bilaterally. The  wound is still pouring serous fluid, although not quite as effusively as at her initial visit. There is still quite a bit of nonviable tissue and hematoma present. 10/13; patient's wound on the right lateral lower leg his eschared over and may well be on its way to healing. The real problem here is on the right dorsal foot just proximal to her toes. The patient is still puzzled by the cause of this. Looking at a picture on the patient's smart phone there is a suggestion that this might have been a hematoma which seems to fit what Dr. [name] had felt on her initial evaluation. They have been using iodoform packing kerlix and Coban. 10/20; right lower leg is healed. We are approved for snap VAC on the right foot. We will try to arrange home health but until then she is going to need a nurse visit on Tuesday 10/20/2022: It has been 2 weeks since I have seen the wound. It has improved significantly since then. She is currently in a snap VAC and I think this has made quite a difference. The overall wound measurements are smaller. There is no massive drainage, as there had been previously. There is some good granulation tissue forming underneath a bit of slough. 10/30/2022: The wound continues to improve with use of the snap VAC. The swelling in her foot is better. The undermining continues to close in. There is a layer of slough overlying good granulation tissue. 11/06/2022: The wound has contracted further. The undermining is still present but the wound surface is better and her  edema control is good. 11/13/2022: The wound is smaller again and the undermining has decreased as well. There is slough accumulation over a nice bed of granulation tissue. 11/20/2022: We did not have any snap VAC devices in clinic last week so the wound was just packed. It did well and is smaller with less undermining today. There is still slough present on the surface. 11/29/2022: The wound has contracted considerably and the undermining has resolved. There is still some slough on the wound surface. Electronic Signature(s) Signed: 11/29/2022 5:00:47 PM By: Fredirick Maudlin MD FACS Entered By: Fredirick Maudlin on 11/29/2022 17:00:46 Physical Exam Details -------------------------------------------------------------------------------- [name] ([id number]) [id number] Page 3 of 9 Patient Name: Date of Service: [name], [name] 11/29/2022 3:15 PM Medical Record Number: [id number] Patient Account Number: [id number] Date of Birth/Sex: Treating RN: 1946-02-26 (76 y.o. F) Primary Care Provider: [name] Other Clinician: Referring Provider: Treating Provider/Extender: [name] in Treatment: 10 Constitutional . . . . No acute distress. Respiratory Normal work of breathing on room air. Notes 11/29/2022: The wound has contracted considerably and the undermining has resolved. There is still some slough on the wound surface. Electronic Signature(s) Signed: 11/29/2022 5:01:10 PM By: Fredirick Maudlin MD FACS Entered By: Fredirick Maudlin on 11/29/2022 17:01:10 -------------------------------------------------------------------------------- Physician Orders Details Patient Name: Date of Service: [name], Sherry RO N [name]. 11/29/2022 3:15 PM Medical Record Number: [id number] Patient Account Number: [id number] Date of Birth/Sex: Treating RN: December 16, 1946 (76 y.o. 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 - Dr. [name] Return A Bathing/ Shower/ Hygiene May shower with protection but do not get wound dressing(s) wet. Negative Presssure Wound Therapy SNAP Vac to wound continuously at 115m/hg pressure Blue Foam Edema Control - Lymphedema / SCD / Other Left Lower Extremity Elevate legs to the level of the heart or above for 30 minutes daily and/or when sitting, a frequency of: Avoid standing for long periods of time. Patient to wear own compression stockings every day. Compression stocking or Garment 20-30 mm/Hg pressure to: - left leg Home Health New wound care orders this week; continue Home Health for wound care. May utilize formulary equivalent dressing for wound treatment orders unless otherwise specified. - Skilled nursing referral for wound care Wound Treatment Wound #1 - Foot Wound Laterality: Dorsal, Right [name], [name] ([id number] Page 4 of 9 Cleanser: Soap and Water 1 x Per Day/30 Days Discharge Instructions: May shower and wash wound with dial antibacterial soap and water prior to dressing change. Cleanser: Wound Cleanser 1 x Per Day/30 Days Discharge Instructions: Cleanse the wound with wound cleanser prior to applying a clean dressing using gauze  sponges, not tissue or cotton balls. Topical: Skintegrity Hydrogel 4 (oz) 1 x Per Day/30 Days Discharge Instructions: Apply hydrogel as directed Prim Dressing: Promogran Prisma Matrix, 4.34 (sq in) (silver collagen) 1 x Per Day/30 Days ary Discharge Instructions: Moisten collagen with saline or hydrogel Secured With: Transpore Surgical Tape, 2x10 (in/yd) 1 x Per Day/30 Days Discharge Instructions: Secure dressing with tape as directed. Compression Wrap: Kerlix Roll 4.5x3.1 (in/yd) (Generic) 1 x Per Day/30 Days Discharge Instructions: Apply Kerlix and Coban compression as directed. Electronic Signature(s) Signed: 11/29/2022 5:07:18 PM By: CFredirick MaudlinMD FACS Previous Signature: 11/29/2022 4:13:08 PM Version By: ZBlanche EastRN Entered By: CFredirick Maudlinon 11/29/2022 17:01:52 -------------------------------------------------------------------------------- Problem List Details Patient Name: Date of Service: SLars MassonRO N [name]. 11/29/2022 3:15 PM Medical Record Number: [id number] Account Number: [id number] of Birth/Sex: Treating RN: 111-22-1947(76y.o. 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 09/20/2022 No Yes without evidence of necrosis L97.812 Non-pressure chronic ulcer of other part of right lower leg with fat layer 09/20/2022 No Yes exposed IZ00.92Chronic systolic (congestive) heart failure 09/20/2022 No Yes M06.9 Rheumatoid arthritis, unspecified 09/20/2022 No Yes Z79.52 Long term (current) use of systemic steroids 09/20/2022 No Yes I70.90 Unspecified atherosclerosis 09/20/2022 No Yes 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 11/29/2022 3:15 PM Medical Record Number: [id number] Account Number: [id number] of Birth/Sex: Treating RN: [birth year](76y.o. F) Primary Care Provider: GKelton PillarOther Clinician: Referring Provider: Treating Provider/Extender: CTonna Cornerin Treatment: 10 Subjective Chief Complaint Information obtained from Patient Patient seen for complaints of Non-Healing Wounds. History of Present Illness (HPI) ADMISSION 09/20/2022 This is a 76year old woman with a past medical history significant for congestive heart failure, atrial fibrillation, rheumatoid  arthritis on long-term steroid treatment, CMML currently receiving chemotherapy and moderate malnutrition. She presents to clinic today with a wound on the dorsum of her right foot. She says it started out as a bruise and then developed a bump which subsequently broke open. She says she was sent to our clinic by her oncologist. She is not diabetic and does not smoke. ABI in clinic today was 1.55. She has had formal segmental arterial Dopplers done, a little over a year ago, which did not show any evidence of occlusive vascular disease. On the dorsal aspect of the right foot, there is a wound with a lot of old hematoma, slough, eschar, and and nonviable subcutaneous tissue. T the best of my o ability to discern, it does appear to involve the muscle layer, but there is no evidence of necrosis. The wound is pouring serous fluid; the patient has 3+ pitting edema to the bilateral lower extremities. 09/29/2022: The  patient came to clinic today with nothing but a T overlying her wound. She is accompanied by her husband today. She has not elfa communicated with her cardiologist regarding her fluid overload. She still has 3+ pitting edema bilaterally. The wound is still pouring serous fluid, although not quite as effusively as at her initial visit. There is still quite a bit of nonviable tissue and hematoma present. 10/13; patient's wound on the right lateral lower leg his eschared over and may well be on its way to healing. The real problem here is on the right dorsal foot just proximal to her toes. The patient is still puzzled by the cause of this. Looking at a picture on the patient's smart phone there is a suggestion that this might have been a hematoma which seems to fit what Dr. [name] had felt on her initial evaluation. They have been using iodoform packing kerlix and Coban. 10/20; right lower leg is healed. We are approved for snap VAC on the right foot. We will try to arrange home health but until then she is going to need a nurse visit on Tuesday 10/20/2022: It has been 2 weeks since I have seen the wound. It has improved significantly since then. She is currently in a snap VAC and I think this has made quite a difference. The overall wound measurements are smaller. There is no massive drainage, as there had been previously. There is some good granulation tissue forming underneath a bit of slough. 10/30/2022: The wound continues to improve with use of the snap VAC. The swelling in her foot is better. The undermining continues to close in. There is a layer of slough overlying good granulation tissue. 11/06/2022: The wound has contracted further. The undermining is still present but the wound surface is better and her edema control is good. 11/13/2022: The wound is smaller again and the undermining has decreased as well. There is slough accumulation over a nice bed of granulation tissue. 11/20/2022: We did  not have any snap VAC devices in clinic last week so the wound was just packed. It did well and is smaller with less undermining today. There is still slough present on the surface. 11/29/2022: The wound has contracted considerably and the undermining has resolved. There is still some slough on the wound surface. Patient History Information obtained from Patient. Family History Unknown History, Diabetes - Father, Heart Disease - Father, No family history of Cancer. Social History Former smoker - quit 2003, Marital Status - Married, Alcohol Use - Daily, Drug Use - Prior History, Caffeine Use -  Daily. [name], [name] ([id number]) [id number] Page 6 of 9 Medical History Eyes Denies history of Cataracts, Glaucoma, Optic Neuritis Ear/Nose/Mouth/Throat Denies history of Chronic sinus problems/congestion, Middle ear problems Respiratory Patient has history of Asthma, Chronic Obstructive Pulmonary Disease (COPD) Cardiovascular Patient has history of Congestive Heart Failure, Hypertension Denies history of Coronary Artery Disease, Deep Vein Thrombosis, Hypotension, Myocardial Infarction Endocrine Denies history of Type I Diabetes, Type II Diabetes Immunological Denies history of Lupus Erythematosus, Raynaudoos, Scleroderma Integumentary (Skin) Denies history of History of Burn Musculoskeletal Patient has history of Rheumatoid Arthritis Denies history of Gout Neurologic Patient has history of Neuropathy Oncologic Patient has history of Received Chemotherapy - currently receiving Hospitalization/Surgery History - reverse shoulder arthroplasty Rt- 2018. Medical A Surgical History Notes nd Cardiovascular AFIB Genitourinary Stage 3 CKD Objective Constitutional No acute distress. Vitals Time Taken: 3:21 PM, Height: 64 in, Weight: 111 lbs, BMI: 19.1, Temperature: 97.5 F, Pulse: 88 bpm, Respiratory Rate: 18 breaths/min, Blood Pressure: 131/74  mmHg. Respiratory Normal work of breathing on room air. General Notes: 11/29/2022: The wound has contracted considerably and the undermining has resolved. There is still some slough on the wound surface. Integumentary (Hair, Skin) Wound #1 status is Open. Original cause of wound was Blister. The date acquired was: 08/23/2022. The wound has been in treatment 10 weeks. The wound is located on the Right,Dorsal Foot. The wound measures 0.9cm length x 0.7cm width x 0.2cm depth; 0.495cm^2 area and 0.099cm^3 volume. There is Fat Layer (Subcutaneous Tissue) exposed. There is no tunneling or undermining noted. There is a medium amount of serosanguineous drainage noted. The wound margin is distinct with the outline attached to the wound base. There is medium (34-66%) pink granulation within the wound bed. There is a medium (34-66%) amount of necrotic tissue within the wound bed including Adherent Slough. The periwound skin appearance had no abnormalities noted for texture. The periwound skin appearance exhibited: Dry/Scaly. The periwound skin appearance did not exhibit: Erythema. Periwound temperature was noted as No Abnormality. 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 There was a Selective/Open Wound Non-Viable Tissue Debridement with a total area of 0.63 sq cm performed by Fredirick Maudlin, MD. With the following  instrument(s): Curette to remove Non-Viable tissue/material. Material removed includes [facility] after achieving pain control using Lidocaine 5% topical ointment. A time out was conducted at 15:34, prior to the start of the procedure. A Minimum amount of bleeding was controlled with Pressure. The procedure was tolerated well. Post Debridement Measurements: 0.9cm length x 0.7cm width x 0.2cm depth; 0.099cm^3 volume. Character of Wound/Ulcer Post Debridement requires further debridement. Post procedure Diagnosis Wound #1: Same as Pre-Procedure General Notes: Scribed for Dr. [name] by Blanche East, RN. Plan Follow-up Appointments: Return Appointment in 1 week. - Dr. [name] Bathing/ Shower/ Hygiene: May shower with protection but do not get wound dressing(s) wet. Negative Presssure Wound Therapy: SNAP Vac to wound continuously at 148m/hg pressure Blue Foam Edema Control - Lymphedema / SCD / Other: Elevate legs to the level of the heart or above for 30 minutes daily and/or when  sitting, a frequency of: Avoid standing for long periods of time. Patient to wear own compression stockings every day. Compression stocking or Garment 20-30 mm/Hg pressure to: - left leg Home Health: New wound care orders this week; continue Home Health for wound care. May utilize formulary equivalent dressing for wound treatment orders unless otherwise specified. - Skilled nursing referral for wound care WOUND #1: - Foot Wound Laterality: Dorsal, Right Cleanser: Soap and Water 1 x Per Day/30 Days Discharge Instructions: May shower and wash wound with dial antibacterial soap and water prior to dressing change. Cleanser: Wound Cleanser 1 x Per Day/30 Days Discharge Instructions: Cleanse the wound with wound cleanser prior to applying a clean dressing using gauze sponges, not tissue or cotton balls. Topical: Skintegrity Hydrogel 4 (oz) 1 x Per Day/30 Days Discharge Instructions: Apply hydrogel as directed Prim Dressing:  Promogran Prisma Matrix, 4.34 (sq in) (silver collagen) 1 x Per Day/30 Days ary Discharge Instructions: Moisten collagen with saline or hydrogel Secured With: Transpore Surgical T ape, 2x10 (in/yd) 1 x Per Day/30 Days Discharge Instructions: Secure dressing with tape as directed. Com pression Wrap: Kerlix Roll 4.5x3.1 (in/yd) (Generic) 1 x Per Day/30 Days Discharge Instructions: Apply Kerlix and Coban compression as directed. 11/29/2022: The wound has contracted considerably and the undermining has resolved. There is still some slough on the wound surface. I used a curette to debride the slough from the wound surface. We will continue Prisma silver collagen underneath the snap VAC. I anticipate that by next week, the wound will be too superficial to accommodate the VAC. Electronic Signature(s) Signed: 11/29/2022 5:02:56 PM By: Fredirick Maudlin MD FACS Entered By: Fredirick Maudlin on 11/29/2022 17:02:55 -------------------------------------------------------------------------------- HxROS Details Patient Name: Date of Service: Sherry Masson RO N [name]. 11/29/2022 3:15 PM Medical Record Number: [id number] Patient Account Number: [id number] Date of Birth/Sex: Treating RN: [birth year] (76 y.o. F) Primary Care Provider: [name] Other Clinician: Referring Provider: Treating Provider/Extender: [name] in Treatment: 10 Information Obtained From Patient Eyes Medical History: Negative for: Cataracts; Glaucoma; Optic Neuritis [name], [name] ([id number]) [id number] Page 8 of 9 Ear/Nose/Mouth/Throat Medical History: Negative for: Chronic sinus problems/congestion; Middle ear problems Respiratory Medical History: Positive for: Asthma; Chronic Obstructive Pulmonary Disease (COPD) Cardiovascular Medical History: Positive for: Congestive Heart Failure; Hypertension Negative for: Coronary Artery Disease; Deep Vein Thrombosis;  Hypotension; Myocardial Infarction Past Medical History Notes: AFIB Endocrine Medical History: Negative for: Type I Diabetes; Type II Diabetes Genitourinary Medical History: Past Medical History Notes: Stage 3 CKD Immunological Medical History: Negative for: Lupus Erythematosus; Raynauds; Scleroderma Integumentary (Skin) Medical History: Negative for: History of Burn Musculoskeletal Medical History: Positive for: Rheumatoid Arthritis Negative for: Gout Neurologic Medical History: Positive for: Neuropathy Oncologic Medical History: Positive for: Received Chemotherapy - currently receiving Immunizations Pneumococcal Vaccine: Received Pneumococcal Vaccination: Yes Received Pneumococcal Vaccination On or After 60th Birthday: Yes Implantable Devices None Hospitalization / Surgery History Type of Hospitalization/Surgery reverse shoulder arthroplasty Rt- 2018 Family and Social History Unknown History: Yes; Cancer: No; Diabetes: Yes - Father; Heart Disease: Yes - Father; Former smoker - quit 2003; Marital Status - Married; Alcohol Use: Daily; Drug Use: Prior History; Caffeine Use: Daily; Financial Concerns: No; Food, Clothing or Shelter Needs: No; Support System Lacking: No; Transportation Concerns: No Electronic Signature(s) Signed: 11/29/2022 5:07:18 PM By: Fredirick Maudlin MD FACS [place], [place] ([id number]) [id number] Page 9 of 9 Entered By: Fredirick Maudlin on 11/29/2022 17:00:52 -------------------------------------------------------------------------------- SuperBill Details Patient Name: Date of Service: [name], POLLITT. 11/29/2022 Medical  Record Number: [id number] Patient Account Number: [id number] Date of Birth/Sex: Treating RN: 09/29/1946 (76 y.o. 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 Facility Procedures : CPT4 Code: 60600459 Description: 97741 - DEBRIDE WOUND 1ST 20 SQ CM OR < ICD-10 Diagnosis Description 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 wit Modifier: hout evidence of n Quantity: 1 ecrosis : CPT4 Code: 42395320 Description: 23343 - WOUND VAC-50 SQ CM OR LESS Modifier: Quantity: 1 Physician Procedures : CPT4 Code Description Modifier [id number] 37290 - WC PHYS LEVEL 4 - EST PT 25 ICD-10 Diagnosis Description 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

## 2022-12-06 ENCOUNTER — Encounter (HOSPITAL_BASED_OUTPATIENT_CLINIC_OR_DEPARTMENT_OTHER): Payer: Medicare Other | Admitting: Internal Medicine

## 2022-12-06 DIAGNOSIS — L97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 evidence of necrosis: Secondary | ICD-10-CM | POA: Diagnosis not present

## 2022-12-06 DIAGNOSIS — I890 Lymphedema, not elsewhere classified: Secondary | ICD-10-CM | POA: Diagnosis not present

## 2022-12-06 DIAGNOSIS — L97512 Non-pressure chronic ulcer of other part of right foot with fat layer exposed: Secondary | ICD-10-CM | POA: Diagnosis not present

## 2022-12-06 DIAGNOSIS — L97812 Non-pressure chronic ulcer of other part of right lower leg with fat layer exposed: Secondary | ICD-10-CM | POA: Diagnosis not present

## 2022-12-06 DIAGNOSIS — I5022 Chronic systolic (congestive) heart failure: Secondary | ICD-10-CM | POA: Diagnosis not present

## 2022-12-06 DIAGNOSIS — I130 Hypertensive heart and chronic kidney disease with heart failure and stage 1 through stage 4 chronic kidney disease, or unspecified chronic kidney disease: Secondary | ICD-10-CM | POA: Diagnosis not present

## 2022-12-06 DIAGNOSIS — M069 Rheumatoid arthritis, unspecified: Secondary | ICD-10-CM | POA: Diagnosis not present

## 2022-12-06 DIAGNOSIS — N183 Chronic kidney disease, stage 3 unspecified: Secondary | ICD-10-CM | POA: Diagnosis not present

## 2022-12-07 NOTE — Progress Notes (Signed)
[name] ([id number]) [id number] Page 1 of 7 Visit Report for 12/06/2022 Debridement Details Patient Name: Date of Service: [name], [name] 12/06/2022 8:00 A M Medical Record Number: [id number] Patient Account Number: [id number] Date of Birth/Sex: Treating RN: 11-11-1946 (76 y.o. F) Primary Care Provider: [name] Other Clinician: Referring Provider: Treating Provider/Extender: [name] in Treatment: 11 Debridement Performed for Assessment: Wound #1 Right,Dorsal Foot Performed By: Physician Ricard Dillon., MD Debridement Type: Debridement Level of Consciousness (Pre-procedure): Awake and Alert Pre-procedure Verification/Time Out Yes - 08:25 Taken: Start Time: 08:25 Pain Control: Lidocaine T Area Debrided (L x W): otal 0.9 (cm) x 0.5 (cm) = 0.45 (cm) Tissue and other material debrided: Viable, Non-Viable, Slough, Slough Level: Non-Viable Tissue Debridement Description: Selective/Open Wound Instrument: Curette Bleeding: Minimum Hemostasis Achieved: Pressure End Time: 08:25 Procedural Pain: 0 Post Procedural Pain: 0 Response to Treatment: Procedure was tolerated well Level of Consciousness (Post- Awake and Alert procedure): Post Debridement Measurements of Total Wound Length: (cm) 0.9 Width: (cm) 0.5 Depth: (cm) 0.1 Volume: (cm) 0.035 Character of Wound/Ulcer Post Debridement: Improved Post Procedure Diagnosis Same as Pre-procedure Electronic Signature(s) Signed: 12/06/2022 5:47:10 PM By: Linton Ham MD Entered By: Linton Ham on 12/06/2022 08:27:47 -------------------------------------------------------------------------------- HPI Details Patient Name: Date of Service: [name] [name] 12/06/2022 8:00 A M Medical Record Number: [id number] Patient Account Number: [id number] Date of Birth/Sex: Treating RN: 07/06/1946 (76 y.o. F) Primary Care Provider: [name] Other  Clinician: Referring Provider: Treating Provider/Extender: [name] in Treatment: 11 History of Present Illness HPI Description: ADMISSION MARLINDA, [name] ([id number]) [id number] Page 2 of 7 09/20/2022 This is a 76 year old woman with a past medical history significant for congestive heart failure, atrial fibrillation, rheumatoid arthritis on long-term steroid treatment, CMML currently receiving chemotherapy and moderate malnutrition. She presents to clinic today with a wound on the dorsum of her right foot. She says it started out as a bruise and then developed a bump which subsequently broke open. She says she was sent to our clinic by her oncologist. She is not diabetic and does not smoke. ABI in clinic today was 1.55. She has had formal segmental arterial Dopplers done, a little over a year ago, which did not show any evidence of occlusive vascular disease. On the dorsal aspect of the right foot, there is a wound with a lot of old hematoma, slough, eschar, and and nonviable subcutaneous tissue. T the best of my o ability to discern, it does appear to involve the muscle layer, but there is no evidence of necrosis. The wound is pouring serous fluid; the patient has 3+ pitting edema to the bilateral lower extremities. 09/29/2022: The patient came to clinic today with nothing but a T overlying her wound. She is accompanied by her husband today. She has not elfa communicated with her cardiologist regarding her fluid overload. She still has 3+ pitting edema bilaterally. The wound is still pouring serous fluid, although not quite as effusively as at her initial visit. There is still quite a bit of nonviable tissue and hematoma present. 10/13; patient's wound on the right lateral lower leg his eschared over and may well be on its way to healing. The real problem here is on the right dorsal foot just proximal to her toes. The patient is still  puzzled by the cause of this. Looking at a picture on the patient's smart phone there is a suggestion that this might have been a hematoma which seems  to fit what Dr. [name] had felt on her initial evaluation. They have been using iodoform packing kerlix and Coban. 10/20; right lower leg is healed. We are approved for snap VAC on the right foot. We will try to arrange home health but until then she is going to need a nurse visit on Tuesday 10/20/2022: It has been 2 weeks since I have seen the wound. It has improved significantly since then. She is currently in a snap VAC and I think this has made quite a difference. The overall wound measurements are smaller. There is no massive drainage, as there had been previously. There is some good granulation tissue forming underneath a bit of slough. 10/30/2022: The wound continues to improve with use of the snap VAC. The swelling in her foot is better. The undermining continues to close in. There is a layer of slough overlying good granulation tissue. 11/06/2022: The wound has contracted further. The undermining is still present but the wound surface is better and her edema control is good. 11/13/2022: The wound is smaller again and the undermining has decreased as well. There is slough accumulation over a nice bed of granulation tissue. 11/20/2022: We did not have any snap VAC devices in clinic last week so the wound was just packed. It did well and is smaller with less undermining today. There is still slough present on the surface. 11/29/2022: The wound has contracted considerably and the undermining has resolved. There is still some slough on the wound surface. 12/13; slough on the surface of the wound required debridement however afterwards the granulation looks healthy the wound has been contracting. She has been using a snap VAC with collagen Electronic Signature(s) Signed: 12/06/2022 5:47:10 PM By: Linton Ham MD Entered By: Linton Ham on  12/06/2022 08:28:49 -------------------------------------------------------------------------------- Physical Exam Details Patient Name: Date of Service: [name] [name] 12/06/2022 8:00 A M Medical Record Number: [id number] Patient Account Number: [id number] Date of Birth/Sex: Treating RN: 06-Aug-1946 (76 y.o. F) Primary Care Provider: [name] Other Clinician: Referring Provider: Treating Provider/Extender: [name] in Treatment: 11 Constitutional Sitting or standing Blood Pressure is within target range for patient.. Pulse regular and within target range for patient.Marland Kitchen Respirations regular, non-labored and within target range.. Temperature is normal and within the target range for the patient.Marland Kitchen Appears in no distress. Notes Wound exam; healthy looking wound after debridement of surface slough for which I used a #3 curette there was no bleeding. There is no evidence of infection. The wound is small with virtually no depth Electronic Signature(s) Signed: 12/06/2022 5:47:10 PM By: Linton Ham MD Entered By: Linton Ham on 12/06/2022 08:30:11 Bean, [name] ([id number]) [id number] Page 3 of 7 -------------------------------------------------------------------------------- Physician Orders Details Patient Name: Date of Service: ALLICIA, CULLEY 12/06/2022 8:00 A M Medical Record Number: [id number] Patient Account Number: [id number] Date of Birth/Sex: Treating RN: 1946-01-04 (76 y.o. Tonita Phoenix, Lauren Primary Care Provider: [name] Other Clinician: Referring Provider: Treating Provider/Extender: [name] in Treatment: 11 Verbal / Phone Orders: No Diagnosis Coding Follow-up Appointments ppointment in 1 week. - Dr. [name] Return A Bathing/ Shower/ Hygiene May shower with protection but do not get wound dressing(s) wet. Edema Control - Lymphedema / SCD /  Other Left Lower Extremity Elevate legs to the level of the heart or above for 30 minutes daily and/or when sitting, a frequency of: Avoid standing for long periods of time. Patient to wear own compression stockings every day. Compression stocking  or Garment 20-30 mm/Hg pressure to: - left leg Wound Treatment Wound #1 - Foot Wound Laterality: Dorsal, Right Cleanser: Soap and Water Every Other Day/15 Days Discharge Instructions: May shower and wash wound with dial antibacterial soap and water prior to dressing change. Cleanser: Wound Cleanser (DME) (Generic) Every Other Day/15 Days Discharge Instructions: Cleanse the wound with wound cleanser prior to applying a clean dressing using gauze sponges, not tissue or cotton balls. Prim Dressing: Hydrofera Blue Ready Transfer Foam, 2.5x2.5 (in/in) Every Other Day/15 Days ary Discharge Instructions: Apply directly to wound bed as directed Secondary Dressing: Woven Gauze Sponge, Non-Sterile 4x4 in (DME) (Generic) Every Other Day/15 Days Discharge Instructions: Apply over primary dressing as directed. Secondary Dressing: Zetuvit Plus Silicone Border Dressing 4x4 (in/in) (DME) (Generic) Every Other Day/15 Days Discharge Instructions: Apply silicone border over primary dressing as directed. Compression Wrap: 41M ACE Elastic Bandage With VELCRO Brand Closure, 4 (in) (DME) (Generic) Every Other Day/15 Days Electronic Signature(s) Signed: 12/06/2022 5:47:10 PM By: Linton Ham MD Signed: 12/07/2022 5:16:09 PM By: Rhae Hammock RN Entered By: Rhae Hammock on 12/06/2022 08:26:32 -------------------------------------------------------------------------------- Problem List Details Patient Name: Date of Service: [name] [name] 12/06/2022 8:00 A M Medical Record Number: [id number] Patient Account Number: [id number] Date of Birth/Sex: Treating RN: 10-15-1946 (76 y.o. F) Primary Care Provider: [name] Other Clinician: Referring  Provider: Treating Provider/Extender: [name] in Treatment: [address] 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 09/20/2022 No Yes without evidence of necrosis L97.812 Non-pressure chronic ulcer of other part of right lower leg with fat layer 09/20/2022 No Yes exposed D40.81 Chronic systolic (congestive) heart failure 09/20/2022 No Yes M06.9 Rheumatoid arthritis, unspecified 09/20/2022 No Yes Z79.52 Long term (current) use of systemic steroids 09/20/2022 No Yes I70.90 Unspecified atherosclerosis 09/20/2022 No Yes 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 12/06/2022 8:00 A M Medical Record Number: [id number] Patient Account Number: [id number] Date of Birth/Sex: Treating RN: 03/13/1946 (76 y.o. F) Primary Care Provider: [name] Other Clinician: Referring Provider: Treating Provider/Extender: [name] in Treatment: 11 Subjective History of Present Illness (HPI) ADMISSION 09/20/2022 This is a 76 year old woman with a past medical history significant for congestive heart failure, atrial fibrillation, rheumatoid arthritis on long-term steroid treatment, CMML currently receiving chemotherapy and moderate malnutrition. She presents to clinic today with a wound on the dorsum of her right foot. She says it started out as a bruise and then developed a bump which subsequently broke  open. She says she was sent to our clinic by her oncologist. She is not diabetic and does not smoke. ABI in clinic today was 1.55. She has had formal segmental arterial Dopplers done, a little over a year ago, which did not show any evidence of occlusive vascular disease. On the dorsal aspect of the right foot, there is a wound with a lot of old hematoma, slough, eschar, and and nonviable subcutaneous tissue. T the best of my o ability to discern, it does appear to involve the muscle layer, but there is no evidence of necrosis. The wound is pouring serous fluid; the patient has 3+ pitting edema  to the bilateral lower extremities. 09/29/2022: The patient came to clinic today with nothing but a T overlying her wound. She is accompanied by her husband today. She has not elfa communicated with her cardiologist regarding her fluid overload. She still has 3+ pitting edema bilaterally. The wound is still pouring serous fluid, although not quite as effusively as at her initial visit. There is still quite a bit of nonviable tissue and hematoma present. LIZZET, HENDLEY ([id number]) [id number] Page 5 of 7 10/13; patient's wound on the right lateral lower leg his eschared over and may well be on its way to healing. The real problem here is on the right dorsal foot just proximal to her toes. The patient is still puzzled by the cause of this. Looking at a picture on the patient's smart phone there is a suggestion that this might have been a hematoma which seems to fit what Dr. [name] had felt on her initial evaluation. They have been using iodoform packing kerlix and Coban. 10/20; right lower leg is healed. We are approved for snap VAC on the right foot. We will try to arrange home health but until then she is going to need a nurse visit on Tuesday 10/20/2022: It has been 2 weeks since I have seen the wound. It has improved significantly since then. She is currently in a snap VAC and  I think this has made quite a difference. The overall wound measurements are smaller. There is no massive drainage, as there had been previously. There is some good granulation tissue forming underneath a bit of slough. 10/30/2022: The wound continues to improve with use of the snap VAC. The swelling in her foot is better. The undermining continues to close in. There is a layer of slough overlying good granulation tissue. 11/06/2022: The wound has contracted further. The undermining is still present but the wound surface is better and her edema control is good. 11/13/2022: The wound is smaller again and the undermining has decreased as well. There is slough accumulation over a nice bed of granulation tissue. 11/20/2022: We did not have any snap VAC devices in clinic last week so the wound was just packed. It did well and is smaller with less undermining today. There is still slough present on the surface. 11/29/2022: The wound has contracted considerably and the undermining has resolved. There is still some slough on the wound surface. 12/13; slough on the surface of the wound required debridement however afterwards the granulation looks healthy the wound has been contracting. She has been using a snap VAC with collagen Objective Constitutional Sitting or standing Blood Pressure is within target range for patient.. Pulse regular and within target range for patient.Marland Kitchen Respirations regular, non-labored and within target range.. Temperature is normal and within the target range for the patient.Marland Kitchen Appears in no distress. Vitals Time Taken: 8:10 AM, Height: 64 in, Weight: 111 lbs, BMI: 19.1, Temperature: 97.7 F, Pulse: 127 bpm, Respiratory Rate: 20 breaths/min, Blood Pressure: 128/84 mmHg. General Notes: Wound exam; healthy looking wound after debridement of surface slough for which I used a #3 curette there was no bleeding. There is no evidence of infection. The wound is small with virtually no  depth Integumentary (Hair, Skin) Wound #1 status is Open. Original cause of wound was Blister. The date acquired was: 08/23/2022. The wound has been in treatment 11 weeks. The wound is located on the Right,Dorsal Foot. The wound measures 0.9cm length x 0.5cm width x 0.1cm depth; 0.353cm^2 area and 0.035cm^3 volume. There  is Fat Layer (Subcutaneous Tissue) exposed. There is no tunneling or undermining noted. There is a medium amount of serosanguineous drainage noted. The wound margin is distinct with the outline attached to the wound base. There is medium (34-66%) pink granulation within the wound bed. There is a medium (34-66%) amount of necrotic tissue within the wound bed including Adherent Slough. The periwound skin appearance had no abnormalities noted for texture. The periwound skin appearance exhibited: Dry/Scaly. The periwound skin appearance did not exhibit: Erythema. Periwound temperature was noted as No Abnormality. 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 There was a Selective/Open Wound Non-Viable Tissue Debridement with a total area of 0.45 sq cm performed by Ricard Dillon., MD. With the following instrument(s): Curette to remove Viable and Non-Viable tissue/material. Material removed includes [facility] after achieving pain control using Lidocaine. No specimens were taken. A time out was conducted at 08:25, prior to the start of the procedure. A Minimum amount of bleeding was controlled  with Pressure. The procedure was tolerated well with a pain level of 0 throughout and a pain level of 0 following the procedure. Post Debridement Measurements: 0.9cm length x 0.5cm width x 0.1cm depth; 0.035cm^3 volume. Character of Wound/Ulcer Post Debridement is improved. Post procedure Diagnosis Wound #1: Same as Pre-Procedure SHANORA, CHRISTENSEN ([id number]) [id number] Page 6 of 7 Plan Follow-up Appointments: Return Appointment in 1 week. - Dr. [name] Bathing/ Shower/ Hygiene: May shower with protection but do not get wound dressing(s) wet. Edema Control - Lymphedema / SCD / Other: Elevate legs to the level of the heart or above for 30 minutes daily and/or when sitting, a frequency of: Avoid standing for long periods of time. Patient to wear own compression stockings every day. Compression stocking or Garment 20-30 mm/Hg pressure to: - left leg WOUND #1: - Foot Wound Laterality: Dorsal, Right Cleanser: Soap and Water Every Other Day/15 Days Discharge Instructions: May shower and wash wound with dial antibacterial soap and water prior to dressing change. Cleanser: Wound Cleanser (DME) (Generic) Every Other Day/15 Days Discharge Instructions: Cleanse the wound with wound cleanser prior to applying a clean dressing using gauze sponges, not tissue or cotton balls. Prim Dressing: Hydrofera Blue Ready Transfer Foam, 2.5x2.5 (in/in) Every Other Day/15 Days ary Discharge Instructions: Apply directly to wound bed as directed Secondary Dressing: Woven Gauze Sponge, Non-Sterile 4x4 in (DME) (Generic) Every Other Day/15 Days Discharge Instructions: Apply over primary dressing as directed. Secondary Dressing: Zetuvit Plus Silicone Border Dressing 4x4 (in/in) (DME) (Generic) Every Other Day/15 Days Discharge Instructions: Apply silicone border over primary dressing as directed. Com pression Wrap: 83M ACE Elastic Bandage With VELCRO Brand Closure, 4 (in) (DME) (Generic) Every  Other Day/15 Days 1. Very healthy looking wound on the dorsal right foot. I do not think she requires a snap VAC as there is no depth with healthy granulation. I will change the primary dressing to [facility] which she will change every second day Electronic Signature(s) Signed: 12/06/2022 5:47:10 PM By: Linton Ham MD Entered By: Linton Ham on 12/06/2022 08:31:04 -------------------------------------------------------------------------------- SuperBill Details Patient Name: Date of Service: [name] RO  Ovidio Hanger 12/06/2022 Medical Record Number: [id number] Patient Account Number: [id number] Date of Birth/Sex: Treating RN: 1946-12-03 (76 y.o. 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 Facility Procedures : CPT4 Code: [id number] Description: 53614 - DEBRIDE WOUND 1ST 20 SQ CM OR < ICD-10 Diagnosis Description 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

## 2022-12-07 NOTE — Progress Notes (Signed)
JASKIRAT, ZERTUCHE ([id number]) [id number] Page 1 of 7 Visit Report for 12/06/2022 Arrival Information Details Patient Name: Date of Service: [name], [name] 12/06/2022 8:00 A M Medical Record Number: [id number] Patient Account Number: [id number] Date of Birth/Sex: Treating RN: Aug 10, 1946 (76 y.o. F) Primary Care Clotiel Troop: [name] Other Clinician: Referring Mckinsey Keagle: Treating Kristeen Lantz/Extender: [name] in Treatment: 11 Visit Information History Since Last Visit All ordered tests and consults were completed: No Patient Arrived: Wheel Chair Added or deleted any medications: No Arrival Time: 08:11 Any new allergies or adverse reactions: No Accompanied By: self Had a fall or experienced change in No Transfer Assistance: None activities of daily living that may affect Patient Identification Verified: Yes risk of falls: Secondary Verification Process Completed: Yes Signs or symptoms of abuse/neglect since last visito No Patient Requires Transmission-Based Precautions: No Hospitalized since last visit: No Patient Has Alerts: No Pain Present Now: No Electronic Signature(s) Signed: 12/06/2022 8:43:05 AM By: Worthy Rancher Entered By: Worthy Rancher on 12/06/2022 08:12:04 -------------------------------------------------------------------------------- Encounter Discharge Information Details Patient Name: Date of Service: [name], [place]. 12/06/2022 8:00 A M Medical Record Number: [id number] Patient Account Number: [id number] Date of Birth/Sex: Treating RN: Nov 11, 1946 (75 y.o. Tonita Phoenix, Lauren Primary Care Fareedah Mahler: [name] Other Clinician: Referring Kaeya Schiffer: Treating Sebasthian Stailey/Extender: [name] in Treatment: 11 Encounter Discharge Information Items Post Procedure Vitals Discharge Condition: Stable Temperature (F): 98.7 Ambulatory Status: Ambulatory Pulse (bpm):  74 Discharge Destination: Home Respiratory Rate (breaths/min): 17 Transportation: Private Auto Blood Pressure (mmHg): 134/74 Accompanied By: self Schedule Follow-up Appointment: Yes Clinical Summary of Care: Patient Declined Electronic Signature(s) Signed: 12/07/2022 5:16:09 PM By: Rhae Hammock RN Entered By: Rhae Hammock on 12/06/2022 08:28:30 Bedgood, [name] ([id number]) [id number] Page 2 of 7 -------------------------------------------------------------------------------- Lower Extremity Assessment Details Patient Name: Date of Service: [name], [name] 12/06/2022 8:00 A M Medical Record Number: [id number] Patient Account Number: [id number] Date of Birth/Sex: Treating RN: 07/13/1946 (76 y.o. Tonita Phoenix, Lauren Primary Care Rodolfo Gaster: [name] Other Clinician: Referring Jakeia Carreras: Treating Vicent Febles/Extender: [name] in Treatment: 11 Edema Assessment Assessed: Shirlyn Goltz: No] Patrice Paradise: Yes] Edema: [Left: N] [Right: o] Calf Left: Right: Point of Measurement: From Medial Instep 27.5 cm Ankle Left: Right: Point of Measurement: From Medial Instep 19.5 cm Vascular Assessment Pulses: Dorsalis Pedis Palpable: [Right:Yes] Posterior Tibial Palpable: [Right:Yes] Electronic Signature(s) Signed: 12/07/2022 5:16:09 PM By: Rhae Hammock RN Entered By: Rhae Hammock on 12/06/2022 08:17:06 -------------------------------------------------------------------------------- Multi Wound Chart Details Patient Name: Date of Service: Sherry Masson [name] 12/06/2022 8:00 A M Medical Record Number: [id number] Patient Account Number: [id number] Date of Birth/Sex: Treating RN: 1946-01-06 (76 y.o. F) Primary Care Raylan Hanton: [name] Other Clinician: Referring La Shehan: Treating Sue Mcalexander/Extender: [name] in Treatment: 11 Vital Signs Height(in): 64 Pulse(bpm): 127 Weight(lbs):  111 Blood Pressure(mmHg): 128/84 Body Mass Index(BMI): 19.1 Temperature(F): 97.7 Respiratory Rate(breaths/min): 20 [1:Photos:] [N/A:N/A] Right, Dorsal Foot N/A N/A Wound Location: Blister N/A N/A Wounding Event: Lymphedema N/A N/A Primary EtiologyZOHAR, LAING ([id number]) [id number] Page 3 of 7 Asthma, Chronic Obstructive N/A N/A Comorbid History: Pulmonary Disease (COPD), Congestive Heart Failure, Hypertension, Rheumatoid Arthritis, Neuropathy, Received Chemotherapy 08/23/2022 N/A N/A Date Acquired: 11 N/A N/A Weeks of Treatment: Open N/A N/A Wound Status: No N/A N/A Wound Recurrence: 0.9x0.5x0.1 N/A N/A Measurements L x W x D (cm) 0.353 N/A N/A A (cm) : rea 0.035 N/A N/A Volume (cm) : 66.00% N/A N/A % Reduction in A rea:  94.40% N/A N/A % Reduction in Volume: Full Thickness Without Exposed N/A N/A Classification: Support Structures Medium N/A N/A Exudate A mount: Serosanguineous N/A N/A Exudate Type: red, brown N/A N/A Exudate Color: Distinct, outline attached N/A N/A Wound Margin: Medium (34-66%) N/A N/A Granulation A mount: Pink N/A N/A Granulation Quality: Medium (34-66%) N/A N/A Necrotic A mount: Fat Layer (Subcutaneous Tissue): Yes N/A N/A Exposed Structures: Fascia: No Tendon: No Muscle: No Joint: No Bone: No None N/A N/A Epithelialization: Debridement - Selective/Open Wound N/A N/A Debridement: Pre-procedure Verification/Time Out 08:25 N/A N/A Taken: Lidocaine N/A N/A Pain Control: Slough N/A N/A Tissue Debrided: Non-Viable Tissue N/A N/A Level: 0.45 N/A N/A Debridement A (sq cm): rea Curette N/A N/A Instrument: Minimum N/A N/A Bleeding: Pressure N/A N/A Hemostasis A chieved: 0 N/A N/A Procedural Pain: 0 N/A N/A Post Procedural Pain: Procedure was tolerated well N/A N/A Debridement Treatment Response: 0.9x0.5x0.1 N/A N/A Post Debridement Measurements L x W x D (cm) 0.035 N/A  N/A Post Debridement Volume: (cm) No Abnormalities Noted N/A N/A Periwound Skin Texture: Dry/Scaly: Yes N/A N/A Periwound Skin Moisture: Erythema: No N/A N/A Periwound Skin Color: No Abnormality N/A N/A Temperature: Debridement N/A N/A Procedures Performed: Treatment Notes Electronic Signature(s) Signed: 12/06/2022 5:47:10 PM By: Linton Ham MD Entered By: Linton Ham on 12/06/2022 08:25:54 -------------------------------------------------------------------------------- Multi-Disciplinary Care Plan Details Patient Name: Date of Service: [name], [place]. 12/06/2022 8:00 A M Medical Record Number: [id number] Patient Account Number: [id number] Date of Birth/Sex: Treating RN: 1946/01/06 (76 y.o. Tonita Phoenix, Lauren Primary Care : [name] Other Clinician: Referring : Treating /Extender: [name] in Treatment: 11 Active Inactive Peripheral Neuropathy AARTI, MANKOWSKI ([id number]) [id number] Page 4 of 7 Nursing Diagnoses: Potential alteration in peripheral tissue perfusion (select prior to confirmation of diagnosis) Goals: Patient/caregiver will verbalize understanding of disease process and disease management Date Initiated: 09/20/2022 Target Resolution Date: 01/17/2023 Goal Status: Active Interventions: Provide education on Management of Neuropathy and Related Ulcers Treatment Activities: Patient referred for customized footwear/offloading : 09/20/2022 Notes: Wound/Skin Impairment Nursing Diagnoses: Knowledge deficit related to ulceration/compromised skin integrity Goals: Ulcer/skin breakdown will have a volume reduction of 30% by week 4 Date Initiated: 09/20/2022 Date Inactivated: 11/29/2022 Target Resolution Date: 11/30/2022 Goal Status: Met Ulcer/skin breakdown will have a volume reduction of 50% by week 8 Date Initiated: 11/29/2022 Target Resolution Date: 01/24/2023 Goal  Status: Active Interventions: Assess ulceration(s) every visit Treatment Activities: Skin care regimen initiated : 09/20/2022 Topical wound management initiated : 09/20/2022 Notes: Electronic Signature(s) Signed: 12/07/2022 5:16:09 PM By: Rhae Hammock RN Entered By: Rhae Hammock on 12/06/2022 08:17:17 -------------------------------------------------------------------------------- Pain Assessment Details Patient Name: Date of Service: [name], [place]. 12/06/2022 8:00 A M Medical Record Number: [id number] Patient Account Number: [id number] Date of Birth/Sex: Treating RN: 1946-08-31 (76 y.o. F) Primary Care : [name] Other Clinician: Referring : Treating /Extender: [name] in Treatment: 11 Active Problems Location of Pain Severity and Description of Pain Patient Has Paino No Site Locations Isola, Lynch ([id number]) [id number] Page 5 of 7 Pain Management and Medication Current Pain Management: Electronic Signature(s) Signed: 12/06/2022 8:43:05 AM By: Worthy Rancher Entered By: Worthy Rancher on 12/06/2022 08:15:44 -------------------------------------------------------------------------------- Patient/Caregiver Education Details Patient Name: Date of Service: [name] 12/13/2023andnbsp8:00 A M Medical Record Number: [id number] Patient Account Number: [id number] Date of Birth/Gender: Treating RN: Mar 13, 1946 (76 y.o. Tonita Phoenix, [place] Primary Care Physician: [name] Other Clinician: Referring Physician: Treating Physician/Extender: Herma Ard, Duaine Dredge  in Treatment: 11 Education Assessment Education Provided To: Patient Education Topics Provided Peripheral Neuropathy: Methods: Explain/Verbal Responses: Reinforcements needed, State content correctly Electronic Signature(s) Signed: 12/07/2022 5:16:09 PM By: Rhae Hammock RN Entered  By: Rhae Hammock on 12/06/2022 08:17:30 -------------------------------------------------------------------------------- Wound Assessment Details Patient Name: Date of Service: Sherry Masson [name] 12/06/2022 8:00 A M Medical Record Number: [id number] Patient Account Number: [id number] Date of Birth/Sex: Treating RN: Apr 22, 1946 (76 y.o. F) Primary Care Luqman Perrelli: [name] Other Clinician: Referring Lailoni Baquera: Treating Dejon Lukas/Extender: [name], [name], [name] ([id number]) [id number].pdf Page 6 of 7 Weeks in Treatment: 11 Wound Status Wound Number: 1 Primary Lymphedema Etiology: Wound Location: Right, Dorsal Foot Wound Open Wounding Event: Blister Status: Date Acquired: 08/23/2022 Comorbid Asthma, Chronic Obstructive Pulmonary Disease (COPD), Weeks Of Treatment: 11 History: Congestive Heart Failure, Hypertension, Rheumatoid Arthritis, Clustered Wound: No Neuropathy, Received Chemotherapy Photos Wound Measurements Length: (cm) 0.9 Width: (cm) 0.5 Depth: (cm) 0.1 Area: (cm) 0.353 Volume: (cm) 0.035 % Reduction in Area: 66% % Reduction in Volume: 94.4% Epithelialization: None Tunneling: No Undermining: No Wound Description Classification: Full Thickness Without Exposed Support Structures Wound Margin: Distinct, outline attached Exudate Amount: Medium Exudate Type: Serosanguineous Exudate Color: red, brown Foul Odor After Cleansing: No Slough/Fibrino Yes Wound Bed Granulation Amount: Medium (34-66%) Exposed Structure Granulation Quality: Pink Fascia Exposed: No Necrotic Amount: Medium (34-66%) Fat Layer (Subcutaneous Tissue) Exposed: Yes Necrotic Quality: Adherent Slough Tendon Exposed: No Muscle Exposed: No Joint Exposed: No Bone Exposed: No Periwound Skin Texture Texture Color No Abnormalities Noted: Yes No Abnormalities Noted: No Erythema: No Moisture No Abnormalities Noted: No Temperature /  Pain Dry / Scaly: Yes Temperature: No Abnormality Treatment Notes Wound #1 (Foot) Wound Laterality: Dorsal, Right Cleanser Soap and Water Discharge Instruction: May shower and wash wound with dial antibacterial soap and water prior to dressing change. Wound Cleanser Discharge Instruction: Cleanse the wound with wound cleanser prior to applying a clean dressing using gauze sponges, not tissue or cotton balls. Peri-Wound Care Topical Primary Dressing Hydrofera Blue Ready Transfer Foam, 2.5x2.5 (in/in) Discharge Instruction: Apply directly to wound bed as directed Secondary Dressing [name], [name] ([id number]) [id number] Page 7 of 7 Woven Gauze Sponge, Non-Sterile 4x4 in Discharge Instruction: Apply over primary dressing as directed. Zetuvit Plus Silicone Border Dressing 4x4 (in/in) Discharge Instruction: Apply silicone border over primary dressing as directed. Secured With Compression Wrap 19M ACE Elastic Bandage With VELCRO Brand Closure, 4 (in) Compression Stockings Add-Ons Electronic Signature(s) Signed: 12/06/2022 8:43:05 AM By: Worthy Rancher Entered By: Worthy Rancher on 12/06/2022 08:19:03 -------------------------------------------------------------------------------- Vitals Details Patient Name: Date of Service: Sherry Masson [name] 12/06/2022 8:00 A M Medical Record Number: [id number] Patient Account Number: [id number] Date of Birth/Sex: Treating RN: October 12, 1946 (76 y.o. F) Primary Care Camdin Hegner: [name] Other Clinician: Referring Monish Haliburton: Treating Yahira Timberman/Extender: [name] in Treatment: 11 Vital Signs Time Taken: 08:10 Temperature (F): 97.7 Height (in): 64 Pulse (bpm): 127 Weight (lbs): 111 Respiratory Rate (breaths/min): 20 Body Mass Index (BMI): 19.1 Blood Pressure (mmHg): 128/84 Reference Range: 80 - 120 mg / dl Electronic Signature(s) Signed: 12/06/2022 8:43:05 AM By: Worthy Rancher Entered  By: Worthy Rancher on 12/06/2022 08:12:34

## 2022-12-10 NOTE — Assessment & Plan Note (Addendum)
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  -She began azacitadine on 02/06/22. she has had multiple hospital admissions and her performance status has been very low.  Changed her chemo to every 6 weeks in 08/2022 due to her fatigue.

## 2022-12-11 ENCOUNTER — Inpatient Hospital Stay: Payer: Medicare Other

## 2022-12-11 ENCOUNTER — Other Ambulatory Visit: Payer: Self-pay

## 2022-12-11 ENCOUNTER — Inpatient Hospital Stay (HOSPITAL_BASED_OUTPATIENT_CLINIC_OR_DEPARTMENT_OTHER): Payer: Medicare Other | Admitting: Hematology

## 2022-12-11 ENCOUNTER — Encounter: Payer: Self-pay | Admitting: Hematology

## 2022-12-11 VITALS — BP 111/91 | HR 85 | Temp 98.1°F | Resp 19 | Ht 62.0 in | Wt 104.2 lb

## 2022-12-11 DIAGNOSIS — C931 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: Secondary | ICD-10-CM

## 2022-12-11 DIAGNOSIS — Z95828 Presence of other vascular implants and grafts: Secondary | ICD-10-CM

## 2022-12-11 DIAGNOSIS — Z5111 Encounter for antineoplastic chemotherapy: Secondary | ICD-10-CM | POA: Diagnosis not present

## 2022-12-11 LAB — CBC WITH DIFFERENTIAL (CANCER CENTER ONLY)
Abs Immature Granulocytes: 0.09 10*3/uL — ABNORMAL HIGH (ref 0.00–0.07)
Basophils Absolute: 0.1 10*3/uL (ref 0.0–0.1)
Basophils Relative: 1 %
Eosinophils Absolute: 0.6 10*3/uL — ABNORMAL HIGH (ref 0.0–0.5)
Eosinophils Relative: 6 %
HCT: 31.5 % — ABNORMAL LOW (ref 36.0–46.0)
Hemoglobin: 10.3 g/dL — ABNORMAL LOW (ref 12.0–15.0)
Immature Granulocytes: 1 %
Lymphocytes Relative: 20 %
Lymphs Abs: 2.1 10*3/uL (ref 0.7–4.0)
MCH: 35.8 pg — ABNORMAL HIGH (ref 26.0–34.0)
MCHC: 32.7 g/dL (ref 30.0–36.0)
MCV: 109.4 fL — ABNORMAL HIGH (ref 80.0–100.0)
Monocytes Absolute: 3 10*3/uL — ABNORMAL HIGH (ref 0.1–1.0)
Monocytes Relative: 29 %
Neutro Abs: 4.6 10*3/uL (ref 1.7–7.7)
Neutrophils Relative %: 43 %
Platelet Count: 105 10*3/uL — ABNORMAL LOW (ref 150–400)
RBC: 2.88 MIL/uL — ABNORMAL LOW (ref 3.87–5.11)
RDW: 15.1 % (ref 11.5–15.5)
WBC Count: 10.5 10*3/uL (ref 4.0–10.5)
nRBC: 0 % (ref 0.0–0.2)

## 2022-12-11 LAB — BASIC METABOLIC PANEL - CANCER CENTER ONLY
Anion gap: 7 (ref 5–15)
BUN: 15 mg/dL (ref 8–23)
CO2: 28 mmol/L (ref 22–32)
Calcium: 9.1 mg/dL (ref 8.9–10.3)
Chloride: 103 mmol/L (ref 98–111)
Creatinine: 0.83 mg/dL (ref 0.44–1.00)
GFR, Estimated: >60 mL/min (ref >60)
Glucose, Bld: 88 mg/dL (ref 70–99)
Potassium: 4 mmol/L (ref 3.5–5.1)
Sodium: 138 mmol/L (ref 135–145)

## 2022-12-11 MED ORDER — SODIUM CHLORIDE 0.9 % IV SOLN
75.0000 mg/m2 | Freq: Once | INTRAVENOUS | Status: AC
  Administered 2022-12-11: 115 mg via INTRAVENOUS
  Filled 2022-12-11: qty 11.5

## 2022-12-11 MED ORDER — SODIUM CHLORIDE 0.9% FLUSH
10.0000 mL | Freq: Once | INTRAVENOUS | Status: AC
  Administered 2022-12-11: 10 mL

## 2022-12-11 MED ORDER — SODIUM CHLORIDE 0.9 % IV SOLN: Freq: Once | INTRAVENOUS | Status: AC

## 2022-12-11 MED ORDER — ONDANSETRON HCL 4 MG/2ML IJ SOLN
8.0000 mg | Freq: Once | INTRAMUSCULAR | Status: AC
  Administered 2022-12-11: 8 mg via INTRAVENOUS

## 2022-12-11 NOTE — Progress Notes (Signed)
[place]   [phone] Fax:([phone]) [phone]   Clinic Follow up Note   Patient Care Team: [name], MD as PCP - General (Family Medicine) [name], Wonda Cheng, MD as PCP - Cardiology (Cardiology) Gavin Pound, MD as Consulting Physician (Rheumatology) Truitt Merle, MD as Consulting Physician (Hematology) Juanita Craver, MD as Consulting Physician (Gastroenterology) Garner Nash, DO as Consulting Physician (Pulmonary Disease)  Date of Service:  12/11/2022  CHIEF COMPLAINT: f/u of CMML   CURRENT THERAPY:  Azacitadine, days 1-5 q6weeks, starting 02/06/22    ASSESSMENT:  [name] is a 76 y.o. 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 Her BM biopsy was reviewed at [facility] and was felt to be CMML-1 with 5% blasts.  -She began azacitadine on 02/06/22. she has had multiple hospital admissions and her performance status has been very low.  Changed her chemo to every 6 weeks in 08/2022 due to her fatigue.    PLAN: -Lab reviewed, adequate for treatment. -Proceed with treatment C4 azacitadine today and daily for next 4 days  -Will monitor labs every 2 weeks to see if she needs any blood transfusion -Follow-up in 6 weeks to start next cycle azacitidine  SUMMARY OF ONCOLOGIC HISTORY: Oncology History  CMML (chronic myelomonocytic leukemia) ([place])  11/09/2021 Initial Biopsy   DIAGNOSIS:   -  Monoclonal B-cell population with co-expression of CD5 comprises 17%  of all lymphocytes  -  See comment   COMMENT:  In addition to the clonal B-cell population, there is a myeloblast  population (CD34, CD38,  HLA-DR, CD117, CD123 and CD33) that comprises 2% of the total cellular events.  Please see concurrent tissue biopsy (below) for additional work-up and final diagnosis.    FINAL MICROSCOPIC DIAGNOSIS:   A. SOFT TISSUE MASS, PRE SACRAL, NEEDLE CORE BIOPSY:  -  Chronic lymphocytic leukemia/small lymphocytic lymphoma  -  Extra medullary hematopoiesis  -  See comment   COMMENT:  The biopsy consists of multiple soft tissue cores with lymphoid nodules and a dense hematopoietic infiltrate consistent with extra medullary hematopoiesis.  MPO and E-cadherin highlight myeloid and erythroid precursors respectively.  CD34 highlights increased vasculature and is also positive within the cytoplasm of megakaryocytes.  A few small, immature mononuclear cells appear to be positive for CD34 and CD117. TdT shows rare, scattered positive cells.  CD20 highlights aggregates of B cells which are admixed with CD3 positive T cells.  T cells are an admixture of CD4 and CD8.  The B cells are also positive for CD5, CD23 and Bcl-2.  The B cells do not show significant staining for CD10, BCL6 or cyclin D1.  CD138 highlights scattered plasma cells which are polytypic by kappa and lambda in situ hybridization.  Flow cytometry performed on the sample (see WL [id number]) identified a kappa restricted CD5 positive B-cell population comprising 70% of lymphocytes.  In addition, a small myeloblast population comprised 2% of the total cellular events.   Overall, the findings are consistent with soft tissue involvement by  chronic lymphocytic leukemia/small lymphocytic lymphoma and extra medullary hematopoiesis. In reviewing the patient's CBC data (macrocytic anemia and thrombocytopenia), I would recommend a bone marrow biopsy to assess for marrow involvement by CLL/SLL.  12/23/2021 Imaging   EXAM: CT CHEST, ABDOMEN, AND PELVIS WITH CONTRAST  IMPRESSION: 1. Slight interval enlargement of a presacral soft tissue mass measuring 7.2 x  4.7 cm, previously 6.9 x 4.1 cm on prior MR of the pelvis dated 07/04/2021. By report, this represents a biopsy proven lymphoma. 2. Pleural nodule of the dependent right lower lobe overlying the posterior right tenth rib and pleural or paraspinous soft tissue mass overlying the right aspect of the T10 vertebral body, very slightly enlarged compared to prior examination of the chest dated 06/25/2020, consistent with additional sites of lymphomatous involvement given very indolent growth. These could be better assessed for metabolic activity by FDG PET/CT if desired. 3. There is mild, bibasilar predominant pulmonary fibrosis in a pattern featuring irregular peripheral interstitial opacity, septal thickening, but without clear evidence of subpleural bronchiolectasis or honeycombing, with a somewhat asymmetric distribution most conspicuously involving the right lower lobe and lingula. These findings are significantly worsened when compared to prior examination dated 06/25/2020, particularly in the right lower lobe. Given interval change, this may reflect sequelae of interval infection or aspiration, however appearance is generally suspicious for fibrotic interstitial lung disease, and if characterized by ATS pulmonary fibrosis is in an "indeterminate for UIP" pattern, differential considerations including both UIP and NSIP. 4. Emphysema.   Aortic Atherosclerosis (ICD10-I70.0) and Emphysema (ICD10-J43.9).   01/05/2022 Pathology Results   DIAGNOSIS:   BONE MARROW, ASPIRATE, CLOT, CORE:  -Hypercellular bone marrow for age with features of  myelodysplastic/myeloproliferative neoplasm  -Minor abnormal B-cell population  -See comment   PERIPHERAL BLOOD:  -Macrocytic anemia  -Neutrophilic left shift and monocytosis  -Thrombocytopenia   COMMENT:  The bone marrow is hypercellular for age with dyspoietic changes  involving myeloid cell lines associated with monocytosis and increased number of  blastic cells (12%) as primarily seen by morphology, many of which display monocytic features.  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  In this background, there are several predominantly small lymphoid aggregates mostly composed of small lymphoid cells.  By flow cytometry, a minor abnormal B-cell population expressing CD5 is seen and representing 2% of all cells.  This correlate with previously known B-cell lymphoproliferative process.  Correlation with cytogenetic and FISH studies is strongly recommended.    DIAGNOSIS:   -Increased number of monocytic cells present (25%)  -Minor abnormal B-cell population identified.  -See comment   COMMENT:  Flow cytometric analysis shows increased number of monocytic cells representing 25% of all cells but without aberrant phenotype or CD34 expression.  A significant CD34-positive blastic population is not identified.  The lymphoid population shows a minor B-cell population representing 2% of all cells and expressing B-cell antigens including CD20 associated with CD5, CD200 and possibly dim kappa expression.  The latter findings are abnormal and correlate with previously known B-cell lymphoproliferative process.  No significant T-cell phenotypic abnormalities identified.    01/12/2022 Initial Diagnosis   CMML (chronic myelomonocytic leukemia) ([place])   01/12/2022 Cancer Staging   Staging form: Chronic Myeloid Leukemia, AJCC 8th Edition - Clinical stage from 01/12/2022: Bone marrow blast count (%): 12, Additional clonal changes: Unknown - Signed by Truitt Merle, MD on 01/12/2022 Stage prefix: Initial diagnosis   02/06/2022 - 08/11/2022 Chemotherapy   Patient is on Treatment Plan : MYELODYSPLASIA  Azacitidine IV D1-7 q28d     02/06/2022 -  Chemotherapy   Patient is on Treatment Plan :  MYELODYSPLASIA  Azacitidine IV D1-7 q28d        INTERVAL HISTORY:  CATHARINE KETTLEWELL is here for a follow up of  CMML  She was last seen by me on 10/30/2022 She presents to the clinic alone.Pt states she get along pretty well with her ADL"s. Pt report of having pain in her legs. She states she does not do any exercise.    All other systems were reviewed with the patient and are negative.  MEDICAL HISTORY:  Past Medical History:  Diagnosis Date   Arthritis    Rheumatoid arthritis   Celiac disease    Chronic kidney disease    stage 3 from MD notes   COPD (chronic obstructive pulmonary disease) ([place])    Dyspnea    with going up stairs   Family history of adverse reaction to anesthesia    father had hard time waking up   Headache    sinus headaches   Hot flashes    Hypertension    Iron deficiency anemia    Pneumonia    per patient "I have walking pneumonia"    SURGICAL HISTORY: Past Surgical History:  Procedure Laterality Date   COLONOSCOPY     ECTOPIC PREGNANCY SURGERY      x 2   IR IMAGING GUIDED PORT INSERTION  01/31/2022   REVERSE SHOULDER ARTHROPLASTY Right 02/02/2017   Procedure: RIGHT REVERSE SHOULDER ARTHROPLASTY;  Surgeon: Netta Cedars, MD;  Location: [place];  Service: Orthopedics;  Laterality: Right;    I have reviewed the social history and family history with the patient and they are unchanged from previous note.  ALLERGIES:  is allergic to digoxin and related, gluten meal, penicillins, alendronate, and hydroxychloroquine.  MEDICATIONS:  Current Outpatient Medications  Medication Sig Dispense Refill   denosumab (PROLIA) 60 MG/ML SOSY injection Inject 60 mg into the skin every 6 (six) months.     diltiazem (CARDIZEM CD) 240 MG 24 hr capsule Take 1 capsule (240 mg total) by mouth daily. 30 capsule 6   fluconazole (DIFLUCAN) 150 MG tablet Take 150 mg by mouth daily.     furosemide (LASIX) 20 MG tablet Take 1 tablet (20 mg total) by mouth daily. 30 tablet 11    gabapentin (NEURONTIN) 300 MG capsule Take 300 mg by mouth in the morning.     InFLIXimab (REMICADE IV) Inject 10 mg into the vein every 2 (two) months.     methylPREDNISolone (MEDROL DOSEPAK) 4 MG TBPK tablet Take 6 pills by mouth day 1, 5 on day 2, 4 on day 3, 3 on day 4, 2 on day 5, 1 on day 6 21 tablet 0   Metoprolol Tartrate 75 MG TABS Take 150 mg by mouth 2 (two) times daily. 360 tablet 3   mirtazapine (REMERON) 7.5 MG tablet Take 1 tablet (7.5 mg total) by mouth at bedtime. 30 tablet 0   nystatin cream (MYCOSTATIN) Apply topically 2 (two) times daily.     potassium chloride SA (KLOR-CON M20) 20 MEQ tablet Take 1 tablet (20 mEq total) by mouth daily. Take with furosemide 90 tablet 3   predniSONE (DELTASONE) 5 MG tablet Take 5 mg by mouth daily with breakfast.     Tiotropium Bromide-Olodaterol (STIOLTO RESPIMAT) 2.5-2.5 MCG/ACT AERS Inhale 2 puffs into the lungs daily. 4 g 5   No current facility-administered medications for this visit.   Facility-Administered Medications Ordered in Other Visits  Medication Dose Route Frequency Provider Last Rate Last Admin   acetaminophen (TYLENOL) 325 MG tablet  diphenhydrAMINE (BENADRYL) 25 mg capsule             PHYSICAL EXAMINATION: ECOG PERFORMANCE STATUS: 3 - Symptomatic, >50% confined to bed  Vitals:   12/11/22 1329  BP: (!) 111/91  Pulse: 85  Resp: 19  Temp: 98.1 F (36.7 C)  SpO2: 100%   Wt Readings from Last 3 Encounters:  12/11/22 104 lb 3.2 oz (47.3 kg)  10/30/22 107 lb 9.6 oz (48.8 kg)  10/25/22 104 lb (47.2 kg)    GENERAL:alert, no distress and comfortable SKIN: skin color, texture, turgor are normal, no rashes or significant lesions EYES: normal, Conjunctiva are pink and non-injected, sclera clear NECK: supple, thyroid normal size, non-tender, without nodularity LYMPH:  no palpable lymphadenopathy in the cervical, axillary  LUNGS: clear to auscultation and percussion with normal breathing effort HEART: regular  rate & rhythm and no murmurs and no lower extremity edema  NEURO: alert & oriented x 3 with fluent speech, no focal motor/sensory deficits  LABORATORY DATA:  I have reviewed the data as listed    Latest Ref Rng & Units 12/11/2022   12:53 PM 11/28/2022   11:49 AM 11/14/2022   11:59 AM  CBC  WBC 4.0 - 10.5 K/uL 10.5  9.7  6.5   Hemoglobin 12.0 - 15.0 g/dL 10.3  11.1  10.5   Hematocrit 36.0 - 46.0 % 31.5  33.9  31.8   Platelets 150 - 400 K/uL 105  72  44         Latest Ref Rng & Units 12/11/2022   12:53 PM 11/28/2022   11:49 AM 11/14/2022   11:59 AM  CMP  Glucose 70 - 99 mg/dL 88  96  111   BUN 8 - 23 mg/dL _0 Creatinine 0.44 - 1.00 mg/dL 0.83  0.94  0.98   Sodium 135 - 145 mmol/L 138  140  137   Potassium 3.5 - 5.1 mmol/L 4.0  3.6  4.9   Chloride 98 - 111 mmol/L 103  100  98   CO2 22 - 32 mmol/L 28  30  32   Calcium 8.9 - 10.3 mg/dL 9.1  9.5  9.9   Total Protein 6.5 - 8.1 g/dL  5.6  6.2   Total Bilirubin 0.3 - 1.2 mg/dL  1.0  0.9   Alkaline Phos 38 - 126 U/L  55  51   AST 15 - 41 U/L  19  24   ALT 0 - 44 U/L  23  29       RADIOGRAPHIC STUDIES: I have personally reviewed the radiological images as listed and agreed with the findings in the report. No results found.    Orders Placed This Encounter  Procedures   CBC with Differential ([place] Only)    Standing Status:   Future    Standing Expiration Date:   0/97/3532   Basic Metabolic Panel - [place] Only    Standing Status:   Future    Standing Expiration Date:   01/23/2024   All questions were answered. The patient knows to call the clinic with any problems, questions or concerns. No barriers to learning was detected. The total time spent in the appointment was 30 minutes.     Truitt Merle, MD 12/11/2022   Felicity Coyer, CMA, am acting as scribe for Truitt Merle, MD.   I have reviewed the above documentation for accuracy and completeness, and I agree with the above.

## 2022-12-12 ENCOUNTER — Inpatient Hospital Stay: Payer: Medicare Other

## 2022-12-12 ENCOUNTER — Telehealth: Payer: Self-pay | Admitting: Hematology

## 2022-12-12 ENCOUNTER — Inpatient Hospital Stay: Payer: Medicare Other | Admitting: Dietician

## 2022-12-12 VITALS — BP 108/59 | HR 92 | Temp 97.8°F | Resp 18

## 2022-12-12 DIAGNOSIS — C931 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: Secondary | ICD-10-CM

## 2022-12-12 DIAGNOSIS — Z5111 Encounter for antineoplastic chemotherapy: Secondary | ICD-10-CM | POA: Diagnosis not present

## 2022-12-12 MED ORDER — SODIUM CHLORIDE 0.9 % IV SOLN
75.0000 mg/m2 | Freq: Once | INTRAVENOUS | Status: AC
  Administered 2022-12-12: 115 mg via INTRAVENOUS
  Filled 2022-12-12: qty 11.5

## 2022-12-12 MED ORDER — SODIUM CHLORIDE 0.9 % IV SOLN: Freq: Once | INTRAVENOUS | Status: AC

## 2022-12-12 MED ORDER — SODIUM CHLORIDE 0.9% FLUSH
10.0000 mL | INTRAVENOUS | Status: DC | PRN
  Administered 2022-12-12: 10 mL

## 2022-12-12 MED ORDER — HEPARIN SOD (PORK) LOCK FLUSH 100 UNIT/ML IV SOLN
500.0000 [IU] | Freq: Once | INTRAVENOUS | Status: AC | PRN
  Administered 2022-12-12: 500 [IU]

## 2022-12-12 MED ORDER — ONDANSETRON HCL 4 MG/2ML IJ SOLN
8.0000 mg | Freq: Once | INTRAMUSCULAR | Status: AC
  Administered 2022-12-12: 8 mg via INTRAVENOUS
  Filled 2022-12-12: qty 4

## 2022-12-12 NOTE — Progress Notes (Signed)
Nutrition Follow-up:  Patient with CMML. She is currently receiving azacitidine q28d.   Met with patient during infusion. She reports appetite comes and goes. Recently, appetite has not been as good. Patient relates this to the holiday's and not having family close by. She is drinking one Boost. Patient reports she forgot about increasing this to twice daily. These are filling. She denies nausea, vomiting, diarrhea, constipation.   Medications: reviewed   Labs: 12/18 - Hgb 10.3  Anthropometrics: Wt 104 lb 3.2 oz on 12/18 decreased 2.8% in 6 weeks - insignificant for time frame, however concerning  11/6 - 107 lb 9.6 oz    NUTRITION DIAGNOSIS: Inadequate oral intake continues     INTERVENTION:  Continue drinking Boost Plus, recommend increasing to 2x/day or having high calorie high protein bedtime snack (hot chocolate, peanut butter on GF crackers with glass of whole milk) Pt given blanket and lotion for her birthday - she was appreciative     MONITORING, EVALUATION, GOAL: weight trends, intake    NEXT VISIT: Wednesday January 31 during infusion

## 2022-12-12 NOTE — Telephone Encounter (Signed)
Gave patient updated copy of  schedule while in infusion

## 2022-12-13 ENCOUNTER — Inpatient Hospital Stay: Payer: Medicare Other

## 2022-12-13 ENCOUNTER — Encounter (HOSPITAL_BASED_OUTPATIENT_CLINIC_OR_DEPARTMENT_OTHER): Payer: Medicare Other | Admitting: General Surgery

## 2022-12-13 ENCOUNTER — Other Ambulatory Visit: Payer: Self-pay

## 2022-12-13 VITALS — BP 110/56 | HR 81 | Temp 97.8°F | Resp 18

## 2022-12-13 DIAGNOSIS — I5022 Chronic systolic (congestive) heart failure: Secondary | ICD-10-CM | POA: Diagnosis not present

## 2022-12-13 DIAGNOSIS — L97512 Non-pressure chronic ulcer of other part of right foot with fat layer exposed: Secondary | ICD-10-CM | POA: Diagnosis not present

## 2022-12-13 DIAGNOSIS — L97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 evidence of necrosis: Secondary | ICD-10-CM | POA: Diagnosis not present

## 2022-12-13 DIAGNOSIS — M069 Rheumatoid arthritis, unspecified: Secondary | ICD-10-CM | POA: Diagnosis not present

## 2022-12-13 DIAGNOSIS — C931 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: Secondary | ICD-10-CM | POA: Diagnosis not present

## 2022-12-13 DIAGNOSIS — I130 Hypertensive heart and chronic kidney disease with heart failure and stage 1 through stage 4 chronic kidney disease, or unspecified chronic kidney disease: Secondary | ICD-10-CM | POA: Diagnosis not present

## 2022-12-13 DIAGNOSIS — N183 Chronic kidney disease, stage 3 unspecified: Secondary | ICD-10-CM | POA: Diagnosis not present

## 2022-12-13 DIAGNOSIS — Z5111 Encounter for antineoplastic chemotherapy: Secondary | ICD-10-CM | POA: Diagnosis not present

## 2022-12-13 DIAGNOSIS — I890 Lymphedema, not elsewhere classified: Secondary | ICD-10-CM | POA: Diagnosis not present

## 2022-12-13 DIAGNOSIS — L97812 Non-pressure chronic ulcer of other part of right lower leg with fat layer exposed: Secondary | ICD-10-CM | POA: Diagnosis not present

## 2022-12-13 MED ORDER — ONDANSETRON HCL 4 MG/2ML IJ SOLN
8.0000 mg | Freq: Once | INTRAMUSCULAR | Status: AC
  Administered 2022-12-13: 8 mg via INTRAVENOUS
  Filled 2022-12-13: qty 4

## 2022-12-13 MED ORDER — HEPARIN SOD (PORK) LOCK FLUSH 100 UNIT/ML IV SOLN
500.0000 [IU] | Freq: Once | INTRAVENOUS | Status: AC | PRN
  Administered 2022-12-13: 500 [IU]

## 2022-12-13 MED ORDER — SODIUM CHLORIDE 0.9 % IV SOLN
75.0000 mg/m2 | Freq: Once | INTRAVENOUS | Status: AC
  Administered 2022-12-13: 115 mg via INTRAVENOUS
  Filled 2022-12-13: qty 11.5

## 2022-12-13 MED ORDER — SODIUM CHLORIDE 0.9% FLUSH
10.0000 mL | INTRAVENOUS | Status: DC | PRN
  Administered 2022-12-13: 10 mL

## 2022-12-13 MED ORDER — SODIUM CHLORIDE 0.9 % IV SOLN: Freq: Once | INTRAVENOUS | Status: AC

## 2022-12-13 NOTE — Patient Instructions (Signed)
[facility]  Discharge Instructions: Thank you for choosing [facility] to provide your oncology and hematology care.   If you have a lab appointment with the [facility], please go directly to the [facility] and check in at the registration area.   Wear comfortable clothing and clothing appropriate for easy access to any Portacath or PICC line.   We strive to give you quality time with your provider. You may need to reschedule your appointment if you arrive late (15 or more minutes).  Arriving late affects you and other patients whose appointments are after yours.  Also, if you miss three or more appointments without notifying the office, you may be dismissed from the clinic at the provider's discretion.      For prescription refill requests, have your pharmacy contact our office and allow 72 hours for refills to be completed.    Today you received the following chemotherapy and/or immunotherapy agent: Azacitidine (Vidaza)   To help prevent nausea and vomiting after your treatment, we encourage you to take your nausea medication as directed.  BELOW ARE SYMPTOMS THAT SHOULD BE REPORTED IMMEDIATELY: *FEVER GREATER THAN 100.4 F (38 C) OR HIGHER *CHILLS OR SWEATING *NAUSEA AND VOMITING THAT IS NOT CONTROLLED WITH YOUR NAUSEA MEDICATION *UNUSUAL SHORTNESS OF BREATH *UNUSUAL BRUISING OR BLEEDING *URINARY PROBLEMS (pain or burning when urinating, or frequent urination) *BOWEL PROBLEMS (unusual diarrhea, constipation, pain near the anus) TENDERNESS IN MOUTH AND THROAT WITH OR WITHOUT PRESENCE OF ULCERS (sore throat, sores in mouth, or a toothache) UNUSUAL RASH, SWELLING OR PAIN  UNUSUAL VAGINAL DISCHARGE OR ITCHING   Items with * indicate a potential emergency and should be followed up as soon as possible or go to the Emergency Department if any problems should occur.  Please show the CHEMOTHERAPY ALERT CARD or IMMUNOTHERAPY ALERT CARD at  check-in to the Emergency Department and triage nurse.  Should you have questions after your visit or need to cancel or reschedule your appointment, please contact [facility]  Dept: [phone]  and follow the prompts.  Office hours are 8:00 a.m. to 4:30 p.m. Monday - Friday. Please note that voicemails left after 4:00 p.m. may not be returned until the following business day.  We are closed weekends and major holidays. You have access to a nurse at all times for urgent questions. Please call the main number to the clinic Dept: [phone] and follow the prompts.   For any non-urgent questions, you may also contact your provider using MyChart. We now offer e-Visits for anyone 36 and older to request care online for non-urgent symptoms. For details visit [email or web address].   Also download the MyChart app! Go to the app store, search "MyChart", open the app, select [facility], and log in with your MyChart username and password.  Masks are optional in the cancer centers. If you would like for your care team to wear a mask while they are taking care of you, please let them know. You may have one support person who is at least 76 years old accompany you for your appointments. Azacitidine Injection What is this medication? AZACITIDINE (ay [place]) treats blood and bone marrow cancers. It works by slowing down the growth of cancer cells. This medicine may be used for other purposes; ask your health care provider or pharmacist if you have questions. COMMON BRAND NAME(S): Vidaza What should I tell my care team before I take this medication? They  need to know if you have any of these conditions: Kidney disease Liver disease Low blood cell levels, such as low white cells, platelets, or red blood cells Low levels of albumin in the blood Low levels of bicarbonate in the blood An unusual or allergic reaction to azacitidine, mannitol, other medications, foods,  dyes, or preservatives If you or your partner are pregnant or trying to get pregnant Breast-feeding How should I use this medication? This medication is injected into a vein or under the skin. It is given by your care team in a hospital or clinic setting. Talk to your care team about the use of this medication in children. While it may be prescribed for children as young as 1 month for selected conditions, precautions do apply. Overdosage: If you think you have taken too much of this medicine contact a poison control center or emergency room at once. NOTE: This medicine is only for you. Do not share this medicine with others. What if I miss a dose? Keep appointments for follow-up doses. It is important not to miss your dose. Call your care team if you are unable to keep an appointment. What may interact with this medication? Interactions are not expected. This list may not describe all possible interactions. Give your health care provider a list of all the medicines, herbs, non-prescription drugs, or dietary supplements you use. Also tell them if you smoke, drink alcohol, or use illegal drugs. Some items may interact with your medicine. What should I watch for while using this medication? Your condition will be monitored carefully while you are receiving this medication. You may need blood work while taking this medication. This medication may make you feel generally unwell. This is not uncommon as chemotherapy can affect healthy cells as well as cancer cells. Report any side effects. Continue your course of treatment even though you feel ill unless your care team tells you to stop. Other product types may be available that contain the medication azacitidine. The injection and oral products should not be used in place of one another. Talk to your care team if you have questions. This medication can cause serious side effects. To reduce the risk, your care team may give you other medications to take  before receiving this one. Be sure to follow the directions from your care team. This medication may increase your risk of getting an infection. Call your care team for advice if you get a fever, chills, sore throat, or other symptoms of a cold or flu. Do not treat yourself. Try to avoid being around people who are sick. Avoid taking medications that contain aspirin, acetaminophen, ibuprofen, naproxen, or ketoprofen unless instructed by your care team. These medications may hide a fever. This medication may increase your risk to bruise or bleed. Call your care team if you notice any unusual bleeding. Be careful brushing or flossing your teeth or using a toothpick because you may get an infection or bleed more easily. If you have any dental work done, tell your dentist you are receiving this medication. Talk to your care team if you or your partner may be pregnant. Serious birth defects can occur if you take this medication during pregnancy and for 6 months after the last dose. You will need a negative pregnancy test before starting this medication. Contraception is recommended while taking his medication and for 6 months after the last dose. Your care team can help you find the option that works for you. If your partner can get  pregnant, use a condom during sex while taking this medication and for 3 months after the last dose. Do not breastfeed while taking this medication and for 1 week after the last dose. This medication may cause infertility. Talk to your care team if you are concerned about your fertility. What side effects may I notice from receiving this medication? Side effects that you should report to your care team as soon as possible: Allergic reactions--skin rash, itching, hives, swelling of the face, lips, tongue, or throat Infection--fever, chills, cough, sore throat, wounds that don't heal, pain or trouble when passing urine, general feeling of discomfort or being unwell Kidney  injury--decrease in the amount of urine, swelling of the ankles, hands, or feet Liver injury--right upper belly pain, loss of appetite, nausea, light-colored stool, dark yellow or brown urine, yellowing skin or eyes, unusual weakness or fatigue Low red blood cell level--unusual weakness or fatigue, dizziness, headache, trouble breathing Tumor lysis syndrome (TLS)--nausea, vomiting, diarrhea, decrease in the amount of urine, dark urine, unusual weakness or fatigue, confusion, muscle pain or cramps, fast or irregular heartbeat, joint pain Unusual bruising or bleeding Side effects that usually do not require medical attention (report to your care team if they continue or are bothersome): Constipation Diarrhea Nausea Pain, redness, or irritation at injection site Vomiting This list may not describe all possible side effects. Call your doctor for medical advice about side effects. You may report side effects to FDA at 1-800-FDA-1088. Where should I keep my medication? This medication is given in a hospital or clinic. It will not be stored at home. NOTE: This sheet is a summary. It may not cover all possible information. If you have questions about this medicine, talk to your doctor, pharmacist, or health care provider.  2023 Elsevier/Gold Standard (2022-04-27 00:00:00) Ondansetron Injection What is this medication? ONDANSETRON (on DAN se tron) prevents nausea and vomiting from chemotherapy, radiation, or surgery. It works by blocking substances in the body that may cause nausea or vomiting. It belongs to a class of medications called antiemetics. This medicine may be used for other purposes; ask your health care provider or pharmacist if you have questions. COMMON BRAND NAME(S): Zofran, Zofran in Dextrose, Zofran Solution What should I tell my care team before I take this medication? They need to know if you have any of these conditions: Heart disease History of irregular heartbeat Liver  disease Low levels of magnesium or potassium in the blood An unusual or allergic reaction to ondansetron, granisetron, other medications, foods, dyes, or preservatives Pregnant or trying to get pregnant Breast-feeding How should I use this medication? This medication is injected into a vein. It is given by your care team in a hospital or clinic setting. Talk to your care team about the use of this medication in children. Special care may be needed. Overdosage: If you think you have taken too much of this medicine contact a poison control center or emergency room at once. NOTE: This medicine is only for you. Do not share this medicine with others. What if I miss a dose? This does not apply. What may interact with this medication? Do not take this medication with any of the following: Apomorphine Certain medications for fungal infections, such as fluconazole, itraconazole, ketoconazole, posaconazole, voriconazole Cisapride Dronedarone Pimozide Thioridazine This medication may also interact with the following: Carbamazepine Certain medications for depression, anxiety, or mental health conditions Fentanyl Linezolid MAOIs, such as Carbex, Eldepryl, Marplan, Nardil, and Parnate Methylene blue (injected into a vein) Other  medications that cause heart rhythm changes, such as dofetilide, ziprasidone Phenytoin Rifampicin Tramadol This list may not describe all possible interactions. Give your health care provider a list of all the medicines, herbs, non-prescription drugs, or dietary supplements you use. Also tell them if you smoke, drink alcohol, or use illegal drugs. Some items may interact with your medicine. What should I watch for while using this medication? Your condition will be monitored carefully while you are receiving this medication. What side effects may I notice from receiving this medication? Side effects that you should report to your care team as soon as possible: Allergic  reactions--skin rash, itching, hives, swelling of the face, lips, tongue, or throat Bowel blockage--stomach cramping, unable to have a bowel movement or pass gas, loss of appetite, vomiting Chest pain (angina)--pain, pressure, or tightness in the chest, neck, back, or arms Heart rhythm changes--fast or irregular heartbeat, dizziness, feeling faint or lightheaded, chest pain, trouble breathing Irritability, confusion, fast or irregular heartbeat, muscle stiffness, twitching muscles, sweating, high fever, seizure, chills, vomiting, diarrhea, which may be signs of serotonin syndrome Side effects that usually do not require medical attention (report to your care team if they continue or are bothersome): Constipation Diarrhea General discomfort and fatigue Headache This list may not describe all possible side effects. Call your doctor for medical advice about side effects. You may report side effects to FDA at 1-800-FDA-1088. Where should I keep my medication? This medication is given in a hospital or clinic and will not be stored at home. NOTE: This sheet is a summary. It may not cover all possible information. If you have questions about this medicine, talk to your doctor, pharmacist, or health care provider.  2023 Elsevier/Gold Standard (2004-12-30 00:00:00)

## 2022-12-14 ENCOUNTER — Inpatient Hospital Stay: Payer: Medicare Other

## 2022-12-14 VITALS — BP 100/76 | HR 88 | Temp 97.9°F | Resp 16 | Wt 106.5 lb

## 2022-12-14 DIAGNOSIS — C931 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: Secondary | ICD-10-CM | POA: Diagnosis not present

## 2022-12-14 DIAGNOSIS — Z5111 Encounter for antineoplastic chemotherapy: Secondary | ICD-10-CM | POA: Diagnosis not present

## 2022-12-14 MED ORDER — HEPARIN SOD (PORK) LOCK FLUSH 100 UNIT/ML IV SOLN
500.0000 [IU] | Freq: Once | INTRAVENOUS | Status: AC | PRN
  Administered 2022-12-14: 500 [IU]

## 2022-12-14 MED ORDER — SODIUM CHLORIDE 0.9 % IV SOLN
75.0000 mg/m2 | Freq: Once | INTRAVENOUS | Status: AC
  Administered 2022-12-14: 115 mg via INTRAVENOUS
  Filled 2022-12-14: qty 11.5

## 2022-12-14 MED ORDER — ONDANSETRON HCL 4 MG/2ML IJ SOLN
8.0000 mg | Freq: Once | INTRAMUSCULAR | Status: AC
  Administered 2022-12-14: 8 mg via INTRAVENOUS
  Filled 2022-12-14: qty 4

## 2022-12-14 MED ORDER — SODIUM CHLORIDE 0.9% FLUSH
10.0000 mL | INTRAVENOUS | Status: DC | PRN
  Administered 2022-12-14: 10 mL

## 2022-12-14 MED ORDER — SODIUM CHLORIDE 0.9 % IV SOLN: Freq: Once | INTRAVENOUS | Status: AC

## 2022-12-14 NOTE — Patient Instructions (Signed)
[facility]  Discharge Instructions: Thank you for choosing [facility] to provide your oncology and hematology care.   If you have a lab appointment with the [facility], please go directly to the [facility] and check in at the registration area.   Wear comfortable clothing and clothing appropriate for easy access to any Portacath or PICC line.   We strive to give you quality time with your provider. You may need to reschedule your appointment if you arrive late (15 or more minutes).  Arriving late affects you and other patients whose appointments are after yours.  Also, if you miss three or more appointments without notifying the office, you may be dismissed from the clinic at the provider's discretion.      For prescription refill requests, have your pharmacy contact our office and allow 72 hours for refills to be completed.    Today you received the following chemotherapy and/or immunotherapy agent: Azacitidine (Vidaza)   To help prevent nausea and vomiting after your treatment, we encourage you to take your nausea medication as directed.  BELOW ARE SYMPTOMS THAT SHOULD BE REPORTED IMMEDIATELY: *FEVER GREATER THAN 100.4 F (38 C) OR HIGHER *CHILLS OR SWEATING *NAUSEA AND VOMITING THAT IS NOT CONTROLLED WITH YOUR NAUSEA MEDICATION *UNUSUAL SHORTNESS OF BREATH *UNUSUAL BRUISING OR BLEEDING *URINARY PROBLEMS (pain or burning when urinating, or frequent urination) *BOWEL PROBLEMS (unusual diarrhea, constipation, pain near the anus) TENDERNESS IN MOUTH AND THROAT WITH OR WITHOUT PRESENCE OF ULCERS (sore throat, sores in mouth, or a toothache) UNUSUAL RASH, SWELLING OR PAIN  UNUSUAL VAGINAL DISCHARGE OR ITCHING   Items with * indicate a potential emergency and should be followed up as soon as possible or go to the Emergency Department if any problems should occur.  Please show the CHEMOTHERAPY ALERT CARD or IMMUNOTHERAPY ALERT CARD at  check-in to the Emergency Department and triage nurse.  Should you have questions after your visit or need to cancel or reschedule your appointment, please contact [facility]  Dept: [phone]  and follow the prompts.  Office hours are 8:00 a.m. to 4:30 p.m. Monday - Friday. Please note that voicemails left after 4:00 p.m. may not be returned until the following business day.  We are closed weekends and major holidays. You have access to a nurse at all times for urgent questions. Please call the main number to the clinic Dept: [phone] and follow the prompts.   For any non-urgent questions, you may also contact your provider using MyChart. We now offer e-Visits for anyone 49 and older to request care online for non-urgent symptoms. For details visit [email or web address].   Also download the MyChart app! Go to the app store, search "MyChart", open the app, select [facility], and log in with your MyChart username and password.  Masks are optional in the cancer centers. If you would like for your care team to wear a mask while they are taking care of you, please let them know. You may have one support person who is at least 76 years old accompany you for your appointments. Azacitidine Injection What is this medication? AZACITIDINE (ay [place]) treats blood and bone marrow cancers. It works by slowing down the growth of cancer cells. This medicine may be used for other purposes; ask your health care provider or pharmacist if you have questions. COMMON BRAND NAME(S): Vidaza What should I tell my care team before I take this medication? They  need to know if you have any of these conditions: Kidney disease Liver disease Low blood cell levels, such as low white cells, platelets, or red blood cells Low levels of albumin in the blood Low levels of bicarbonate in the blood An unusual or allergic reaction to azacitidine, mannitol, other medications, foods,  dyes, or preservatives If you or your partner are pregnant or trying to get pregnant Breast-feeding How should I use this medication? This medication is injected into a vein or under the skin. It is given by your care team in a hospital or clinic setting. Talk to your care team about the use of this medication in children. While it may be prescribed for children as young as 1 month for selected conditions, precautions do apply. Overdosage: If you think you have taken too much of this medicine contact a poison control center or emergency room at once. NOTE: This medicine is only for you. Do not share this medicine with others. What if I miss a dose? Keep appointments for follow-up doses. It is important not to miss your dose. Call your care team if you are unable to keep an appointment. What may interact with this medication? Interactions are not expected. This list may not describe all possible interactions. Give your health care provider a list of all the medicines, herbs, non-prescription drugs, or dietary supplements you use. Also tell them if you smoke, drink alcohol, or use illegal drugs. Some items may interact with your medicine. What should I watch for while using this medication? Your condition will be monitored carefully while you are receiving this medication. You may need blood work while taking this medication. This medication may make you feel generally unwell. This is not uncommon as chemotherapy can affect healthy cells as well as cancer cells. Report any side effects. Continue your course of treatment even though you feel ill unless your care team tells you to stop. Other product types may be available that contain the medication azacitidine. The injection and oral products should not be used in place of one another. Talk to your care team if you have questions. This medication can cause serious side effects. To reduce the risk, your care team may give you other medications to take  before receiving this one. Be sure to follow the directions from your care team. This medication may increase your risk of getting an infection. Call your care team for advice if you get a fever, chills, sore throat, or other symptoms of a cold or flu. Do not treat yourself. Try to avoid being around people who are sick. Avoid taking medications that contain aspirin, acetaminophen, ibuprofen, naproxen, or ketoprofen unless instructed by your care team. These medications may hide a fever. This medication may increase your risk to bruise or bleed. Call your care team if you notice any unusual bleeding. Be careful brushing or flossing your teeth or using a toothpick because you may get an infection or bleed more easily. If you have any dental work done, tell your dentist you are receiving this medication. Talk to your care team if you or your partner may be pregnant. Serious birth defects can occur if you take this medication during pregnancy and for 6 months after the last dose. You will need a negative pregnancy test before starting this medication. Contraception is recommended while taking his medication and for 6 months after the last dose. Your care team can help you find the option that works for you. If your partner can get  pregnant, use a condom during sex while taking this medication and for 3 months after the last dose. Do not breastfeed while taking this medication and for 1 week after the last dose. This medication may cause infertility. Talk to your care team if you are concerned about your fertility. What side effects may I notice from receiving this medication? Side effects that you should report to your care team as soon as possible: Allergic reactions--skin rash, itching, hives, swelling of the face, lips, tongue, or throat Infection--fever, chills, cough, sore throat, wounds that don't heal, pain or trouble when passing urine, general feeling of discomfort or being unwell Kidney  injury--decrease in the amount of urine, swelling of the ankles, hands, or feet Liver injury--right upper belly pain, loss of appetite, nausea, light-colored stool, dark yellow or brown urine, yellowing skin or eyes, unusual weakness or fatigue Low red blood cell level--unusual weakness or fatigue, dizziness, headache, trouble breathing Tumor lysis syndrome (TLS)--nausea, vomiting, diarrhea, decrease in the amount of urine, dark urine, unusual weakness or fatigue, confusion, muscle pain or cramps, fast or irregular heartbeat, joint pain Unusual bruising or bleeding Side effects that usually do not require medical attention (report to your care team if they continue or are bothersome): Constipation Diarrhea Nausea Pain, redness, or irritation at injection site Vomiting This list may not describe all possible side effects. Call your doctor for medical advice about side effects. You may report side effects to FDA at 1-800-FDA-1088. Where should I keep my medication? This medication is given in a hospital or clinic. It will not be stored at home. NOTE: This sheet is a summary. It may not cover all possible information. If you have questions about this medicine, talk to your doctor, pharmacist, or health care provider.  2023 Elsevier/Gold Standard (2022-04-27 00:00:00)

## 2022-12-14 NOTE — Progress Notes (Signed)
[name], [name] ([id number]) [id number] Page 1 of 7 Visit Report for 12/13/2022 Arrival Information Details Patient Name: Date of Service: [name], [name] 12/13/2022 3:15 PM Medical Record Number: [id number] Patient Account Number: [id number] Date of Birth/Sex: Treating RN: 05/31/1946 (76 y.o. F) [name] Primary Care Win Guajardo: [name] [name] Other Clinician: Referring [name]: Treating [name]/Extender: [name], [name] Weeks in Treatment: 12 Visit Information History Since Last Visit Added or deleted any medications: No Patient Arrived: Wheel Chair Any new allergies or adverse reactions: No Arrival Time: 15:43 Had a fall or experienced change in No Accompanied By: spouse activities of daily living that may affect Transfer Assistance: None risk of falls: Patient Identification Verified: Yes Signs or symptoms of abuse/neglect since last visito No Patient Requires Transmission-Based Precautions: No Hospitalized since last visit: No Patient Has Alerts: No Implantable device outside of the clinic excluding No cellular tissue based products placed in the center since last visit: Has Dressing in Place as Prescribed: Yes Pain Present Now: No Electronic Signature(s) Signed: 12/13/2022 5:28:34 PM By: Sherry Catholic RN Entered By: [name] on 12/13/2022 15:44:33 -------------------------------------------------------------------------------- Encounter Discharge Information Details Patient Name: Date of Service: Atomic [name], [name]. 12/13/2022 3:15 PM Medical Record Number: [id number] Patient Account Number: [id number] Date of Birth/Sex: Treating RN: [birth year] (76 y.o. [name], [name] Primary Care Psalm Arman: [name] Other Clinician: Referring Gao Mitnick: Treating Jansen Sciuto/Extender: [name], [name] Weeks in Treatment: 12 Encounter Discharge Information Items Post Procedure  Vitals Discharge Condition: Stable Temperature (F): 97.8 Ambulatory Status: Wheelchair Pulse (bpm): 55 Discharge Destination: Home Respiratory Rate (breaths/min): 18 Transportation: Private Auto Blood Pressure (mmHg): 115/73 Accompanied By: self Schedule Follow-up Appointment: Yes Clinical Summary of Care: Electronic Signature(s) Signed: 12/13/2022 5:28:34 PM By: Sherry Catholic RN Previous Signature: 12/13/2022 4:41:22 PM Version By: Sherry East RN Entered By: [name] on 12/13/2022 16:44:16 [name] ([id number]) [id number] Page 2 of 7 -------------------------------------------------------------------------------- Lower Extremity Assessment Details Patient Name: Date of Service: [name], [name] 12/13/2022 3:15 PM Medical Record Number: [id number] Patient Account Number: [id number] Date of Birth/Sex: Treating RN: 07-23-1946 (76 y.o. [name] Primary Care Brigido Mera: [name] Other Clinician: Referring Miquel Lamson: Treating Ilsa Bonello/Extender: [name], [name] Weeks in Treatment: 12 Edema Assessment Assessed: [Left: No] [Right: No] Edema: [Left: N] [Right: o] Calf Left: Right: Point of Measurement: From Medial Instep 27.6 cm Ankle Left: Right: Point of Measurement: From Medial Instep 23 cm Vascular Assessment Pulses: Dorsalis Pedis Palpable: [Right:Yes] Electronic Signature(s) Signed: 12/13/2022 5:28:34 PM By: Sherry Catholic RN Entered By: [name] on 12/13/2022 15:53:21 -------------------------------------------------------------------------------- Multi Wound Chart Details Patient Name: Date of Service: Sherry Masson RO Ovidio Hanger. 12/13/2022 3:15 PM Medical Record Number: [id number] Patient Account Number: [id number] Date of Birth/Sex: Treating RN: Jul 17, 1946 (76 y.o. F) Primary Care Kuba Shepherd: [name] Other Clinician: Referring Mckinna Demars: Treating Josuel Koeppen/Extender: [name], [name] Weeks in Treatment: 12 Vital Signs Height(in): 64 Pulse(bpm): 55 Weight(lbs): 111 Blood Pressure(mmHg): 115/73 Body Mass Index(BMI): 19.1 Temperature(F): 97.8 Respiratory Rate(breaths/min): [1:Photos:] [N/A:N/A] Right, Dorsal Foot N/A N/A Wound Location: Blister N/A N/A Wounding Event: Lymphedema N/A N/A Primary Etiology: Asthma, Chronic Obstructive N/A N/A Comorbid History: Pulmonary Disease (COPD), Congestive Heart Failure, Hypertension, Rheumatoid Arthritis, Neuropathy, Received Chemotherapy 08/23/2022 N/A N/A Date Acquired: 12 N/A N/A Weeks of Treatment: Open N/A N/A Wound Status: No N/A N/A Wound Recurrence: 0.9x0.5x0.1 N/A N/A Measurements L x W x D (cm) 0.353 N/A N/A A (cm) : rea  0.035 N/A N/A Volume (cm) : 66.00% N/A N/A % Reduction in A rea: 94.40% N/A N/A % Reduction in Volume: Full Thickness Without Exposed N/A N/A Classification: Support Structures Medium N/A N/A Exudate A mount: Serosanguineous N/A N/A Exudate Type: red, [name] N/A N/A Exudate Color: Distinct, outline attached N/A N/A Wound Margin: Large (67-100%) N/A N/A Granulation A mount: Pink N/A N/A Granulation Quality: Small (1-33%) N/A N/A Necrotic A mount: Fat Layer (Subcutaneous Tissue): Yes N/A N/A Exposed Structures: Fascia: No Tendon: No Muscle: No Joint: No Bone: No None N/A N/A Epithelialization: Debridement - Selective/Open Wound N/A N/A Debridement: Pre-procedure Verification/Time Out 16:10 N/A N/A Taken: Lidocaine 5% topical ointment N/A N/A Pain Control: Slough N/A N/A Tissue Debrided: Non-Viable Tissue N/A N/A Level: 0.45 N/A N/A Debridement A (sq cm): rea Curette N/A N/A Instrument: Minimum N/A N/A Bleeding: Pressure N/A N/A Hemostasis A chieved: Procedure was tolerated well N/A N/A Debridement Treatment Response: 0.9x0.5x0.1 N/A N/A Post Debridement Measurements L x W x D (cm) 0.035 N/A N/A Post Debridement  Volume: (cm) No Abnormalities Noted N/A N/A Periwound Skin Texture: Dry/Scaly: Yes N/A N/A Periwound Skin Moisture: Erythema: No N/A N/A Periwound Skin Color: No Abnormality N/A N/A Temperature: Debridement N/A N/A Procedures Performed: Treatment Notes Electronic Signature(s) Signed: 12/13/2022 4:37:44 PM By: Fredirick Maudlin MD FACS Entered By: Fredirick Maudlin on 12/13/2022 16:37:44 -------------------------------------------------------------------------------- Multi-Disciplinary Care Plan Details Patient Name: Date of Service: [name], Sherry [name] 12/13/2022 3:15 PM Medical Record Number: [id number] Patient Account Number: [id number] Date of Birth/Sex: Treating RN: [birth year] (76 y.o. [name], [name] Primary Care Jassmin Kemmerer: [name] Other Clinician: Referring Anaiza Behrens: Treating Saryn Cherry/Extender: [name], [name] Weeks in Treatment: [address] [place] ([id number]) [id number] Page 4 of 7 Peripheral Neuropathy Nursing Diagnoses: Potential alteration in peripheral tissue perfusion (select prior to confirmation of diagnosis) Goals: Patient/caregiver will verbalize understanding of disease process and disease management Date Initiated: 09/20/2022 Target Resolution Date: 01/17/2023 Goal Status: Active Interventions: Provide education on Management of Neuropathy and Related Ulcers Treatment Activities: Patient referred for customized footwear/offloading : 09/20/2022 Notes: Wound/Skin Impairment Nursing Diagnoses: Knowledge deficit related to ulceration/compromised skin integrity Goals: Ulcer/skin breakdown will have a volume reduction of 30% by week 4 Date Initiated: 09/20/2022 Date Inactivated: 11/29/2022 Target Resolution Date: 11/30/2022 Goal Status: Met Ulcer/skin breakdown will have a volume reduction of 50% by week 8 Date Initiated: 11/29/2022 Target Resolution Date: 01/24/2023 Goal Status:  Active Interventions: Assess ulceration(s) every visit Treatment Activities: Skin care regimen initiated : 09/20/2022 Topical wound management initiated : 09/20/2022 Notes: Electronic Signature(s) Signed: 12/13/2022 4:19:23 PM By: Sherry East RN Entered By: [name] on 12/13/2022 16:19:22 -------------------------------------------------------------------------------- Pain Assessment Details Patient Name: Date of Service: Sherry Masson [name] 12/13/2022 3:15 PM Medical Record Number: [id number] Patient Account Number: [id number] Date of Birth/Sex: Treating RN: [birth year] (76 y.o. [name] Primary Care Napoleon Monacelli: [name] Other Clinician: Referring Caylon Saine: Treating Peola Joynt/Extender: [name], [name] Weeks in Treatment: 12 Active Problems Location of Pain Severity and Description of Pain Patient Has Paino No Site Locations [place], [place] ([id number]) [id number] Page 5 of 7 Pain Management and Medication Current Pain Management: Electronic Signature(s) Signed: 12/13/2022 5:28:34 PM By: Sherry Catholic RN Entered By: [name] on 12/13/2022 15:45:17 -------------------------------------------------------------------------------- Patient/Caregiver Education Details Patient Name: Date of Service: [name] 12/20/2023andnbsp3:15 PM Medical Record Number: [id number] Patient Account Number: [id number] Date of Birth/Gender: Treating RN: 02-15-1946 (76 y.o. F) Zochol, [name] Primary Care Physician: Laurann Montana, Sherry  [name] Other Clinician: Referring Physician: Treating Physician/Extender: [name], [name] Weeks in Treatment: 12 Education Assessment Education Provided To: Patient Education Topics Provided Wound/Skin Impairment: Methods: Explain/Verbal Responses: Reinforcements needed, State content correctly Electronic Signature(s) Signed: 12/14/2022 4:49:39 PM By: Sherry East  RN Entered By: [name] on 12/13/2022 16:19:34 -------------------------------------------------------------------------------- Wound Assessment Details Patient Name: Date of Service: [name], Sherry RO Ovidio Hanger. 12/13/2022 3:15 PM Medical Record Number: [id number] Patient Account Number: [id number] Date of Birth/Sex: Treating RN: 21-Oct-1946 (76 y.o. [name] Primary Care Airiana Elman: [name] Other Clinician: Referring Tim Corriher: Treating Devan Babino/Extender: [name], [name] [place], [place] ([id number]) [id number] Page 6 of 7 Weeks in Treatment: 12 Wound Status Wound Number: 1 Primary Lymphedema Etiology: Wound Location: Right, Dorsal Foot Wound Open Wounding Event: Blister Status: Date Acquired: 08/23/2022 Comorbid Asthma, Chronic Obstructive Pulmonary Disease (COPD), Weeks Of Treatment: 12 History: Congestive Heart Failure, Hypertension, Rheumatoid Arthritis, Clustered Wound: No Neuropathy, Received Chemotherapy Photos Wound Measurements Length: (cm) 0.9 Width: (cm) 0.5 Depth: (cm) 0.1 Area: (cm) 0.353 Volume: (cm) 0.035 % Reduction in Area: 66% % Reduction in Volume: 94.4% Epithelialization: None Tunneling: No Undermining: No Wound Description Classification: Full Thickness Without Exposed Support Structures Wound Margin: Distinct, outline attached Exudate Amount: Medium Exudate Type: Serosanguineous Exudate Color: red, [name] Foul Odor After Cleansing: No Slough/Fibrino Yes Wound Bed Granulation Amount: Large (67-100%) Exposed Structure Granulation Quality: Pink Fascia Exposed: No Necrotic Amount: Small (1-33%) Fat Layer (Subcutaneous Tissue) Exposed: Yes Necrotic Quality: Adherent Slough Tendon Exposed: No Muscle Exposed: No Joint Exposed: No Bone Exposed: No Periwound Skin Texture Texture Color No Abnormalities Noted: Yes No Abnormalities Noted: No Erythema: No Moisture No Abnormalities Noted:  No Temperature / Pain Dry / Scaly: Yes Temperature: No Abnormality Treatment Notes Wound #1 (Foot) Wound Laterality: Dorsal, Right Cleanser Soap and Water Discharge Instruction: May shower and wash wound with dial antibacterial soap and water prior to dressing change. Wound Cleanser Discharge Instruction: Cleanse the wound with wound cleanser prior to applying a clean dressing using gauze sponges, not tissue or cotton balls. Peri-Wound Care Topical Primary Dressing Hydrofera Blue Ready Transfer Foam, 2.5x2.5 (in/in) Discharge Instruction: Apply directly to wound bed as directed Secondary Dressing [name], [name] ([id number]) [id number] Page 7 of 7 Woven Gauze Sponge, Non-Sterile 4x4 in Discharge Instruction: Apply over primary dressing as directed. Zetuvit Plus Silicone Border Dressing 4x4 (in/in) Discharge Instruction: Apply silicone border over primary dressing as directed. Secured With Compression Wrap 53M ACE Elastic Bandage With VELCRO Brand Closure, 4 (in) Compression Stockings Add-Ons Electronic Signature(s) Signed: 12/13/2022 5:28:34 PM By: Sherry Catholic RN Entered By: [name] on 12/13/2022 15:56:04 -------------------------------------------------------------------------------- Vitals Details Patient Name: Date of Service: Sherry Masson [name] 12/13/2022 3:15 PM Medical Record Number: [id number] Patient Account Number: [id number] Date of Birth/Sex: Treating RN: 1946/02/24 (76 y.o. [name] Primary Care Orvis Stann: [name] [name] Other Clinician: Referring Terius Jacuinde: Treating Tywone Bembenek/Extender: [name], [name] Weeks in Treatment: 12 Vital Signs Time Taken: 15:45 Temperature (F): 97.8 Height (in): 64 Pulse (bpm): 55 Weight (lbs): 111 Respiratory Rate (breaths/min): 18 Body Mass Index (BMI): 19.1 Blood Pressure (mmHg): 115/73 Reference Range: 80 - 120 mg / dl Electronic Signature(s) Signed:  12/13/2022 4:40:41 PM By: Sherry East RN Entered By: [name] on 12/13/2022 16:40:41

## 2022-12-14 NOTE — Progress Notes (Signed)
LORRIE, GARGAN ([id number]) [id number] Page 1 of 9 Visit Report for 12/13/2022 Chief Complaint Document Details Patient Name: Date of Service: [name], [name] 12/13/2022 3:15 PM Medical Record Number: [id number] Patient Account Number: [id number] Date of Birth/Sex: Treating RN: 01-30-1946 (76 y.o. F) Primary Care Provider: Marcy Panning Other Clinician: Referring Provider: Treating Provider/Extender: Lacie Draft, ELA INE Weeks in Treatment: 12 Information Obtained from: Patient Chief Complaint Patient seen for complaints of Non-Healing Wounds. Electronic Signature(s) Signed: 12/13/2022 4:37:51 PM By: Fredirick Maudlin MD FACS Entered By: Fredirick Maudlin on 12/13/2022 16:37:51 -------------------------------------------------------------------------------- Debridement Details Patient Name: Date of Service: Sherry Masson RO N [name]. 12/13/2022 3:15 PM Medical Record Number: [id number] Patient Account Number: [id number] Date of Birth/Sex: Treating RN: 08-04-1946 (76 y.o. F) [place], [place] Primary Care Provider: Judie Bonus INE Other Clinician: Referring Provider: Treating Provider/Extender: Lacie Draft, ELA INE Weeks in Treatment: 12 Debridement Performed for Assessment: Wound #1 Right,Dorsal Foot Performed By: Physician Fredirick Maudlin, MD Debridement Type: Debridement Level of Consciousness (Pre-procedure): Awake and Alert Pre-procedure Verification/Time Out Yes - 16:10 Taken: Start Time: 16:11 Pain Control: Lidocaine 5% topical ointment T Area Debrided (L x W): otal 0.9 (cm) x 0.5 (cm) = 0.45 (cm) Tissue and other material debrided: Non-Viable, Slough, Slough Level: Non-Viable Tissue Debridement Description: Selective/Open Wound Instrument: Curette Bleeding: Minimum Hemostasis Achieved: Pressure Response to Treatment: Procedure was tolerated well Level of Consciousness (Post- Awake and Alert procedure): Post  Debridement Measurements of Total Wound Length: (cm) 0.9 Width: (cm) 0.5 Depth: (cm) 0.1 Volume: (cm) 0.035 Character of Wound/Ulcer Post Debridement: Requires Further Debridement Post Procedure Diagnosis Same as Pre-procedure Analiyah, Lechuga Tate ([id number]) [id number] Page 2 of 9 Notes Scribed for Dr. [name] by Blanche East, RN Electronic Signature(s) Signed: 12/13/2022 4:53:29 PM By: Fredirick Maudlin MD FACS Signed: 12/14/2022 4:49:39 PM By: Blanche East RN Entered By: Blanche East on 12/13/2022 16:12:23 -------------------------------------------------------------------------------- HPI Details Patient Name: Date of Service: Sherry Masson RO N [name]. 12/13/2022 3:15 PM Medical Record Number: [id number] Patient Account Number: [id number] Date of Birth/Sex: Treating RN: [birth year] (76 y.o. F) Primary Care Provider: Marcy Panning Other Clinician: Referring Provider: Treating Provider/Extender: Lacie Draft, ELA INE Weeks in Treatment: 12 History of Present Illness HPI Description: ADMISSION 09/20/2022 This is a 76 year old woman with a past medical history significant for congestive heart failure, atrial fibrillation, rheumatoid arthritis on long-term steroid treatment, CMML currently receiving chemotherapy and moderate malnutrition. She presents to clinic today with a wound on the dorsum of her right foot. She says it started out as a bruise and then developed a bump which subsequently broke open. She says she was sent to our clinic by her oncologist. She is not diabetic and does not smoke. ABI in clinic today was 1.55. She has had formal segmental arterial Dopplers done, a little over a year ago, which did not show any evidence of occlusive vascular disease. On the dorsal aspect of the right foot, there is a wound with a lot of old hematoma, slough, eschar, and and nonviable subcutaneous tissue. T the best of my o ability to discern, it  does appear to involve the muscle layer, but there is no evidence of necrosis. The wound is pouring serous fluid; the patient has 3+ pitting edema to the bilateral lower extremities. 09/29/2022: The patient came to clinic today with nothing but a T overlying her wound. She is accompanied by her husband today. She has not elfa communicated with her cardiologist regarding her fluid overload. She still  has 3+ pitting edema bilaterally. The wound is still pouring serous fluid, although not quite as effusively as at her initial visit. There is still quite a bit of nonviable tissue and hematoma present. 10/13; patient's wound on the right lateral lower leg his eschared over and may well be on its way to healing. The real problem here is on the right dorsal foot just proximal to her toes. The patient is still puzzled by the cause of this. Looking at a picture on the patient's smart phone there is a suggestion that this might have been a hematoma which seems to fit what Dr. [name] had felt on her initial evaluation. They have been using iodoform packing kerlix and Coban. 10/20; right lower leg is healed. We are approved for snap VAC on the right foot. We will try to arrange home health but until then she is going to need a nurse visit on Tuesday 10/20/2022: It has been 2 weeks since I have seen the wound. It has improved significantly since then. She is currently in a snap VAC and I think this has made quite a difference. The overall wound measurements are smaller. There is no massive drainage, as there had been previously. There is some good granulation tissue forming underneath a bit of slough. 10/30/2022: The wound continues to improve with use of the snap VAC. The swelling in her foot is better. The undermining continues to close in. There is a layer of slough overlying good granulation tissue. 11/06/2022: The wound has contracted further. The undermining is still present but the wound surface is better  and her edema control is good. 11/13/2022: The wound is smaller again and the undermining has decreased as well. There is slough accumulation over a nice bed of granulation tissue. 11/20/2022: We did not have any snap VAC devices in clinic last week so the wound was just packed. It did well and is smaller with less undermining today. There is still slough present on the surface. 11/29/2022: The wound has contracted considerably and the undermining has resolved. There is still some slough on the wound surface. 12/13; slough on the surface of the wound required debridement however afterwards the granulation looks healthy the wound has been contracting. She has been using a snap VAC with collagen 12/13/2022: The wound was too small and superficial last week and the snap VAC was discontinued. They have been using Hydrofera Blue due to the hypertrophic granulation tissue. The wound has a little bit of slough on the surface. Electronic Signature(s) Signed: 12/13/2022 4:38:30 PM By: Fredirick Maudlin MD FACS Entered By: Fredirick Maudlin on 12/13/2022 16:38:30 [name], [name] ([id number]) [id number] Page 3 of 9 -------------------------------------------------------------------------------- Physical Exam Details Patient Name: Date of Service: [name], [name] 12/13/2022 3:15 PM Medical Record Number: [id number] Patient Account Number: [id number] Date of Birth/Sex: Treating RN: [birth year] (76 y.o. F) Primary Care Provider: Marcy Panning Other Clinician: Referring Provider: Treating Provider/Extender: Lacie Draft, ELA INE Weeks in Treatment: 12 Constitutional . Bradycardic, asymptomatic.. . . No acute distress. Respiratory Normal work of breathing on room air. Notes 12/13/2022: The wound is smaller and nearly flush with the surrounding skin surface. There is a little slough present. Electronic Signature(s) Signed: 12/13/2022 4:39:23 PM By: Fredirick Maudlin MD FACS Entered By: Fredirick Maudlin on 12/13/2022 16:39:23 -------------------------------------------------------------------------------- Physician Orders Details Patient Name: Date of Service: [name], Sherry RO N [name]. 12/13/2022 3:15 PM Medical Record Number: [id number] Patient Account Number: [id number] Date of Birth/Sex: Treating RN: 11/17/1946 (76 y.o.  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 - Dr. [name] Return A Bathing/ Shower/ Hygiene May shower with protection but do not get wound dressing(s) wet. Edema Control - Lymphedema / SCD / Other Left Lower Extremity Elevate legs to the level of the heart or above for 30 minutes daily and/or when sitting, a frequency of: Avoid standing for long periods of time. Patient to wear own compression stockings every day. Compression stocking or Garment 20-30 mm/Hg pressure to: - left leg Wound Treatment Wound #1 - Foot Wound Laterality: Dorsal, Right Cleanser: Soap and Water Every Other Day/15 Days GATHA, MCNULTY ([id number]) [id number] Page 4 of 9 Discharge Instructions: May shower and wash wound with dial antibacterial soap and water prior to dressing change. Cleanser: Wound Cleanser (Generic) Every Other Day/15 Days Discharge Instructions: Cleanse the wound with wound cleanser prior to  applying a clean dressing using gauze sponges, not tissue or cotton balls. Prim Dressing: Hydrofera Blue Ready Transfer Foam, 2.5x2.5 (in/in) Every Other Day/15 Days ary Discharge Instructions: Apply directly to wound bed as directed Secondary Dressing: Woven Gauze Sponge, Non-Sterile 4x4 in (Generic) Every Other Day/15 Days Discharge Instructions: Apply over primary dressing as directed. Secondary Dressing: Zetuvit Plus Silicone Border Dressing 4x4 (in/in) (Generic) Every Other Day/15 Days Discharge Instructions: Apply silicone border over primary dressing as directed. Compression Wrap: 72M ACE Elastic Bandage With VELCRO Brand Closure, 4 (in) (Generic) Every Other Day/15 Days Electronic Signature(s) Signed: 12/13/2022 4:53:29 PM By: Fredirick Maudlin MD FACS Entered By: Fredirick Maudlin on 12/13/2022 16:39:37 -------------------------------------------------------------------------------- Problem List Details Patient Name: Date of Service: Sherry Masson RO N [name]. 12/13/2022 3:15 PM Medical Record Number: [id number] Patient Account Number: [id number] Date of Birth/Sex: Treating RN: Apr 05, 1946 (76 y.o. 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 09/20/2022 No Yes without evidence of necrosis N23.55 Chronic systolic (congestive) heart failure 09/20/2022 No Yes M06.9 Rheumatoid arthritis, unspecified 09/20/2022 No Yes Z79.52 Long term (current) use of systemic steroids 09/20/2022 No Yes I70.90 Unspecified atherosclerosis 09/20/2022 No Yes 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 12/13/2022 3:15 PM Medical Record Number: [id number] Patient Account Number: [id number] Date of Birth/Sex: Treating RN: 1946/05/28 (76 y.o. F) Primary Care Provider: Marcy Panning Other Clinician: Referring Provider: Treating Provider/Extender: Lacie Draft, ELA INE Weeks in Treatment: 12 Subjective Chief Complaint Information obtained from Patient Patient seen for complaints of Non-Healing Wounds. History of Present Illness (HPI)  ADMISSION 09/20/2022 This is a 76 year old woman with a past medical history significant for congestive heart failure, atrial fibrillation, rheumatoid arthritis on long-term steroid treatment, CMML currently receiving chemotherapy and moderate malnutrition. She presents to clinic today with a wound on the dorsum of her right foot. She says it started out as a bruise and then developed a bump which subsequently broke open. She says she was sent to our clinic by her oncologist. She is not diabetic and does not smoke. ABI in clinic today was 1.55. She has had formal segmental arterial Dopplers done, a little over a year ago, which did not show any evidence of occlusive vascular disease. On the dorsal aspect of the right foot, there is a wound with a lot of old hematoma, slough, eschar, and and nonviable subcutaneous tissue. T the best of my o ability to discern, it does appear to involve the muscle layer, but there is no evidence of necrosis. The wound is  pouring serous fluid; the patient has 3+ pitting edema to the bilateral lower extremities. 09/29/2022: The patient came to clinic today with nothing but a T overlying her wound. She is accompanied by her husband today. She has not elfa communicated with her cardiologist regarding her fluid overload. She still has 3+ pitting edema bilaterally. The wound is still pouring serous fluid, although not quite as effusively as at her initial visit. There is still quite a bit of nonviable tissue and hematoma present. 10/13; patient's wound on the right lateral lower leg his eschared over and may well be on its way to healing. The real problem here is on the right dorsal foot just proximal to her toes. The patient is still puzzled by the cause of this. Looking at a picture on the patient's smart phone there is a suggestion that this might have been a hematoma which seems to fit what Dr. [name] had felt on her initial evaluation. They have been using iodoform packing kerlix and Coban. 10/20; right lower leg is healed. We are approved for snap VAC on the right foot. We will try to arrange home health but until then she is going to need a nurse visit on Tuesday 10/20/2022: It has been 2 weeks since I have seen the wound. It has improved significantly since then. She is currently in a snap VAC and I think this has made quite a difference. The overall wound measurements are smaller. There is no massive drainage, as there had been previously. There is some good granulation tissue forming underneath a bit of slough. 10/30/2022: The wound continues to improve with use of the snap VAC. The swelling in her foot is better. The undermining continues to close in. There is a layer of slough overlying good granulation tissue. 11/06/2022: The wound has contracted further. The undermining is still present but the wound surface is better and her edema control is good. 11/13/2022: The wound is smaller again and the undermining has  decreased as well. There is slough accumulation over a nice bed of granulation tissue. 11/20/2022: We did not have any snap VAC devices in clinic last week so the wound was just packed. It did well and is smaller with less undermining today. There is still slough present on the surface. 11/29/2022: The wound has contracted considerably and the undermining has resolved. There is still some slough on the wound surface. 12/13; slough on the surface of the wound required debridement however afterwards the granulation looks healthy the wound has been contracting. She has been  using a snap VAC with collagen 12/13/2022: The wound was too small and superficial last week and the snap VAC was discontinued. They have been using Hydrofera Blue due to the hypertrophic granulation tissue. The wound has a little bit of slough on the surface. Patient History Information obtained from Patient. Family History [name], [name] ([id number]) [id number] Page 6 of 9 Unknown History, Diabetes - Father, Heart Disease - Father, No family history of Cancer. Social History Former smoker - quit 2003, Marital Status - Married, Alcohol Use - Daily, Drug Use - Prior History, Caffeine Use - Daily. Medical History Eyes Denies history of Cataracts, Glaucoma, Optic Neuritis Ear/Nose/Mouth/Throat Denies history of Chronic sinus problems/congestion, Middle ear problems Respiratory Patient has history of Asthma, Chronic Obstructive Pulmonary Disease (COPD) Cardiovascular Patient has history of Congestive Heart Failure, Hypertension Denies history of Coronary Artery Disease, Deep Vein Thrombosis, Hypotension, Myocardial Infarction Endocrine Denies history of Type I Diabetes, Type II Diabetes Immunological Denies history of Lupus Erythematosus, Raynaudoos, Scleroderma Integumentary (Skin) Denies history of History of Burn Musculoskeletal Patient has history of Rheumatoid Arthritis Denies  history of Gout Neurologic Patient has history of Neuropathy Oncologic Patient has history of Received Chemotherapy - currently receiving Hospitalization/Surgery History - reverse shoulder arthroplasty Rt- 2018. Medical A Surgical History Notes nd Cardiovascular AFIB Genitourinary Stage 3 CKD Objective Constitutional Bradycardic, asymptomatic.Marland Kitchen No acute distress. Vitals Time Taken: 3:45 PM, Height: 64 in, Weight: 111 lbs, BMI: 19.1, Temperature: 97.8 F, Pulse: 55 bpm, Blood Pressure: 115/73 mmHg. Respiratory Normal work of breathing on room air. General Notes: 12/13/2022: The wound is smaller and nearly flush with the surrounding skin surface. There is a little slough present. Integumentary (Hair, Skin) Wound #1 status is Open. Original cause of wound was Blister. The date acquired was: 08/23/2022. The wound has been in treatment 12 weeks. The wound is located on the Right,Dorsal Foot. The wound measures 0.9cm length x 0.5cm width x 0.1cm depth; 0.353cm^2 area and 0.035cm^3 volume. There is Fat Layer (Subcutaneous Tissue) exposed. There is no tunneling or undermining noted. There is a medium amount of serosanguineous drainage noted. The wound margin is distinct with the outline attached to the wound base. There is large (67-100%) pink granulation within the wound bed. There is a small (1-33%) amount of necrotic tissue within the wound bed including Adherent Slough. The periwound skin appearance had no abnormalities noted for texture. The periwound skin appearance exhibited: Dry/Scaly. The periwound skin appearance did not exhibit: Erythema. Periwound temperature was noted as No Abnormality. 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 There was a Selective/Open Wound Non-Viable Tissue Debridement with a total area of 0.45 sq cm performed by Fredirick Maudlin, MD. With the following instrument(s): Curette to remove Non-Viable tissue/material. Material removed includes [facility] after achieving pain control using Lidocaine 5% topical ointment. No specimens were taken. A time out was conducted at 16:10, prior to the start of the procedure. A Minimum amount of bleeding was controlled with Pressure. The procedure was tolerated well. Post Debridement Measurements: 0.9cm length x 0.5cm width x 0.1cm depth; 0.035cm^3 volume. Character of Wound/Ulcer Post Debridement requires further debridement. Post procedure Diagnosis Wound #  1: Same as Pre-Procedure General Notes: Scribed for Dr. [name] by Blanche East, RN. Plan Follow-up Appointments: Return Appointment in 1 week. - Dr. [name] Bathing/ Shower/ Hygiene: May shower with protection but do not get wound dressing(s) wet. Edema Control - Lymphedema / SCD / Other: Elevate legs to the level of the heart or above for 30 minutes daily and/or when sitting, a frequency of: Avoid standing for long periods of time. Patient to wear own compression stockings every day. Compression stocking or Garment 20-30 mm/Hg pressure to: - left leg WOUND #1: - Foot Wound Laterality: Dorsal, Right Cleanser: Soap and Water Every Other Day/15 Days Discharge Instructions: May shower and wash wound with dial antibacterial soap and water prior to dressing change. Cleanser: Wound Cleanser (Generic) Every Other Day/15 Days Discharge Instructions: Cleanse the wound with wound cleanser prior to applying a clean dressing using gauze sponges, not tissue or  cotton balls. Prim Dressing: Hydrofera Blue Ready Transfer Foam, 2.5x2.5 (in/in) Every Other Day/15 Days ary Discharge Instructions: Apply directly to wound bed as directed Secondary Dressing: Woven Gauze Sponge, Non-Sterile 4x4 in (Generic) Every Other Day/15 Days Discharge Instructions: Apply over primary dressing as directed. Secondary Dressing: Zetuvit Plus Silicone Border Dressing 4x4 (in/in) (Generic) Every Other Day/15 Days Discharge Instructions: Apply silicone border over primary dressing as directed. Com pression Wrap: 34M ACE Elastic Bandage With VELCRO Brand Closure, 4 (in) (Generic) Every Other Day/15 Days 12/13/2022: The wound is smaller and nearly flush with the surrounding skin surface. There is a little slough present. I used a curette to debride the slough from the wound surface. We will continue Hydrofera Blue ready foam with Kerlix and Ace bandaging. Follow-up in 1 week. Electronic Signature(s) Signed: 12/13/2022 4:45:19 PM By: Fredirick Maudlin MD FACS Entered By: Fredirick Maudlin on 12/13/2022 16:45:19 -------------------------------------------------------------------------------- HxROS Details Patient Name: Date of Service: Sherry Masson RO N [name]. 12/13/2022 3:15 PM Medical Record Number: [id number] Patient Account Number: [id number] Date of Birth/Sex: Treating RN: August 17, 1946 (76 y.o. F) Primary Care Provider: Marcy Panning Other Clinician: Referring Provider: Treating Provider/Extender: Lacie Draft, ELA INE Weeks in Treatment: 12 Information Obtained From Patient Eyes Medical History: Negative for: Cataracts; Glaucoma; Optic Neuritis Ear/Nose/Mouth/Throat [name], [name] ([id number]) [id number] Page 8 of 9 Medical History: Negative for: Chronic sinus problems/congestion; Middle ear problems Respiratory Medical History: Positive for: Asthma; Chronic Obstructive Pulmonary Disease (COPD) Cardiovascular Medical  History: Positive for: Congestive Heart Failure; Hypertension Negative for: Coronary Artery Disease; Deep Vein Thrombosis; Hypotension; Myocardial Infarction Past Medical History Notes: AFIB Endocrine Medical History: Negative for: Type I Diabetes; Type II Diabetes Genitourinary Medical History: Past Medical History Notes: Stage 3 CKD Immunological Medical History: Negative for: Lupus Erythematosus; Raynauds; Scleroderma Integumentary (Skin) Medical History: Negative for: History of Burn Musculoskeletal Medical History: Positive for: Rheumatoid Arthritis Negative for: Gout Neurologic Medical History: Positive for: Neuropathy Oncologic Medical History: Positive for: Received Chemotherapy - currently receiving Immunizations Pneumococcal Vaccine: Received Pneumococcal Vaccination: Yes Received Pneumococcal Vaccination On or After 60th Birthday: Yes Implantable Devices None Hospitalization / Surgery History Type of Hospitalization/Surgery reverse shoulder arthroplasty Rt- 2018 Family and Social History Unknown History: Yes; Cancer: No; Diabetes: Yes - Father; Heart Disease: Yes - Father; Former smoker - quit 2003; Marital Status - Married; Alcohol Use: Daily; Drug Use: Prior History; Caffeine Use: Daily; Financial Concerns: No; Food, Clothing or Shelter Needs: No; Support System Lacking: No; Transportation Concerns: No Electronic Signature(s) Signed: 12/13/2022 4:53:29 PM By: Fredirick Maudlin MD FACS Entered By: Fredirick Maudlin on 12/13/2022 16:38:36 [place],  [name] ([id number]) [id number] Page 9 of 9 -------------------------------------------------------------------------------- SuperBill Details Patient Name: Date of Service: [name], [name] 12/13/2022 Medical Record Number: [id number] Patient Account Number: [id number] Date of Birth/Sex: Treating RN: May 16, 1946 (76 y.o. 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 Facility Procedures : CPT4 Code: [id number] Description: 37106 - DEBRIDE WOUND 1ST 20 SQ CM OR < ICD-10 Diagnosis Description 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 wit Modifier: hout evidence of n Quantity: 1 ecrosis Physician Procedures : CPT4 Code Description Modifier [id number] - WC PHYS LEVEL 3 - EST PT 25 ICD-10 Diagnosis Description 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

## 2022-12-15 ENCOUNTER — Other Ambulatory Visit: Payer: Self-pay

## 2022-12-15 ENCOUNTER — Inpatient Hospital Stay: Payer: Medicare Other

## 2022-12-15 VITALS — BP 111/89 | HR 90 | Temp 97.9°F | Resp 18

## 2022-12-15 DIAGNOSIS — Z5111 Encounter for antineoplastic chemotherapy: Secondary | ICD-10-CM | POA: Diagnosis not present

## 2022-12-15 DIAGNOSIS — C931 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: Secondary | ICD-10-CM

## 2022-12-15 MED ORDER — SODIUM CHLORIDE 0.9% FLUSH
10.0000 mL | INTRAVENOUS | Status: DC | PRN
  Administered 2022-12-15: 10 mL

## 2022-12-15 MED ORDER — SODIUM CHLORIDE 0.9 % IV SOLN: Freq: Once | INTRAVENOUS | Status: AC

## 2022-12-15 MED ORDER — ONDANSETRON HCL 4 MG/2ML IJ SOLN
8.0000 mg | Freq: Once | INTRAMUSCULAR | Status: AC
  Administered 2022-12-15: 8 mg via INTRAVENOUS
  Filled 2022-12-15: qty 4

## 2022-12-15 MED ORDER — SODIUM CHLORIDE 0.9 % IV SOLN
75.0000 mg/m2 | Freq: Once | INTRAVENOUS | Status: AC
  Administered 2022-12-15: 115 mg via INTRAVENOUS
  Filled 2022-12-15: qty 11.5

## 2022-12-15 MED ORDER — HEPARIN SOD (PORK) LOCK FLUSH 100 UNIT/ML IV SOLN
500.0000 [IU] | Freq: Once | INTRAVENOUS | Status: AC | PRN
  Administered 2022-12-15: 500 [IU]

## 2022-12-15 NOTE — Patient Instructions (Signed)
[facility]  Discharge Instructions: Thank you for choosing [facility] to provide your oncology and hematology care.   If you have a lab appointment with the [facility], please go directly to the [facility] and check in at the registration area.   Wear comfortable clothing and clothing appropriate for easy access to any Portacath or PICC line.   We strive to give you quality time with your provider. You may need to reschedule your appointment if you arrive late (15 or more minutes).  Arriving late affects you and other patients whose appointments are after yours.  Also, if you miss three or more appointments without notifying the office, you may be dismissed from the clinic at the provider's discretion.      For prescription refill requests, have your pharmacy contact our office and allow 72 hours for refills to be completed.    Today you received the following chemotherapy and/or immunotherapy agents: Vidaza.      To help prevent nausea and vomiting after your treatment, we encourage you to take your nausea medication as directed.  BELOW ARE SYMPTOMS THAT SHOULD BE REPORTED IMMEDIATELY: *FEVER GREATER THAN 100.4 F (38 C) OR HIGHER *CHILLS OR SWEATING *NAUSEA AND VOMITING THAT IS NOT CONTROLLED WITH YOUR NAUSEA MEDICATION *UNUSUAL SHORTNESS OF BREATH *UNUSUAL BRUISING OR BLEEDING *URINARY PROBLEMS (pain or burning when urinating, or frequent urination) *BOWEL PROBLEMS (unusual diarrhea, constipation, pain near the anus) TENDERNESS IN MOUTH AND THROAT WITH OR WITHOUT PRESENCE OF ULCERS (sore throat, sores in mouth, or a toothache) UNUSUAL RASH, SWELLING OR PAIN  UNUSUAL VAGINAL DISCHARGE OR ITCHING   Items with * indicate a potential emergency and should be followed up as soon as possible or go to the Emergency Department if any problems should occur.  Please show the CHEMOTHERAPY ALERT CARD or IMMUNOTHERAPY ALERT CARD at check-in to  the Emergency Department and triage nurse.  Should you have questions after your visit or need to cancel or reschedule your appointment, please contact [facility]  Dept: [phone]  and follow the prompts.  Office hours are 8:00 a.m. to 4:30 p.m. Monday - Friday. Please note that voicemails left after 4:00 p.m. may not be returned until the following business day.  We are closed weekends and major holidays. You have access to a nurse at all times for urgent questions. Please call the main number to the clinic Dept: [phone] and follow the prompts.   For any non-urgent questions, you may also contact your provider using MyChart. We now offer e-Visits for anyone 36 and older to request care online for non-urgent symptoms. For details visit [email or web address].   Also download the MyChart app! Go to the app store, search "MyChart", open the app, select [facility], and log in with your MyChart username and password.  Masks are optional in the cancer centers. If you would like for your care team to wear a mask while they are taking care of you, please let them know. You may have one support person who is at least 76 years old accompany you for your appointments.

## 2022-12-17 ENCOUNTER — Encounter (HOSPITAL_COMMUNITY): Payer: Self-pay

## 2022-12-17 ENCOUNTER — Emergency Department (HOSPITAL_COMMUNITY)
Admission: EM | Admit: 2022-12-17 | Discharge: 2022-12-17 | Disposition: A | Discharge status: Home / Self Care | Payer: Medicare Other | Attending: Emergency Medicine | Admitting: Emergency Medicine

## 2022-12-17 DIAGNOSIS — N183 Chronic kidney disease, stage 3 unspecified: Secondary | ICD-10-CM | POA: Diagnosis not present

## 2022-12-17 DIAGNOSIS — Y9389 Activity, other specified: Secondary | ICD-10-CM | POA: Diagnosis not present

## 2022-12-17 DIAGNOSIS — I129 Hypertensive chronic kidney disease with stage 1 through stage 4 chronic kidney disease, or unspecified chronic kidney disease: Secondary | ICD-10-CM | POA: Insufficient documentation

## 2022-12-17 DIAGNOSIS — Y9289 Other specified places as the place of occurrence of the external cause: Secondary | ICD-10-CM | POA: Diagnosis not present

## 2022-12-17 DIAGNOSIS — J449 Chronic obstructive pulmonary disease, unspecified: Secondary | ICD-10-CM | POA: Diagnosis not present

## 2022-12-17 DIAGNOSIS — S81812A Laceration without foreign body, left lower leg, initial encounter: Secondary | ICD-10-CM | POA: Insufficient documentation

## 2022-12-17 DIAGNOSIS — Z23 Encounter for immunization: Secondary | ICD-10-CM | POA: Diagnosis not present

## 2022-12-17 DIAGNOSIS — Y998 Other external cause status: Secondary | ICD-10-CM | POA: Insufficient documentation

## 2022-12-17 MED ORDER — BACITRACIN ZINC 500 UNIT/GM EX OINT: TOPICAL_OINTMENT | Freq: Two times a day (BID) | CUTANEOUS | Status: DC

## 2022-12-17 MED ORDER — OXYCODONE-ACETAMINOPHEN 5-325 MG PO TABS
1.0000 | ORAL_TABLET | Freq: Once | ORAL | Status: AC
  Administered 2022-12-17: 1 via ORAL
  Filled 2022-12-17: qty 1

## 2022-12-17 MED ORDER — LIDOCAINE HCL (PF) 1 % IJ SOLN
20.0000 mL | Freq: Once | INTRAMUSCULAR | Status: AC
  Administered 2022-12-17: 20 mL via INTRADERMAL
  Filled 2022-12-17: qty 30

## 2022-12-17 MED ORDER — MUPIROCIN CALCIUM 2 % EX CREA: 1.0000 | TOPICAL_CREAM | Freq: Two times a day (BID) | CUTANEOUS | 0 refills | Status: DC

## 2022-12-17 MED ORDER — TETANUS-DIPHTH-ACELL PERTUSSIS 5-2.5-18.5 LF-MCG/0.5 IM SUSY
0.5000 mL | PREFILLED_SYRINGE | Freq: Once | INTRAMUSCULAR | Status: AC
  Administered 2022-12-17: 0.5 mL via INTRAMUSCULAR
  Filled 2022-12-17: qty 0.5

## 2022-12-17 NOTE — Discharge Instructions (Addendum)
Please return to an urgent care or your primary care physician in 10-14 days for suture removal.  Keep your wound clean and dry, change dressings every day. Take tylenol/ibuprofen for pain.  Please do not hesitate to return to emergency department if worrisome signs symptoms we discussed become apparent.

## 2022-12-17 NOTE — ED Provider Notes (Signed)
[facility] DEPT Provider Note   CSN: [id number] Arrival date & time: 12/17/22  0844     History  Chief Complaint  Patient presents with   Laceration    [name] is a 76 y.o. female with a past medical history of CKD, COPD, hypertension presenting to the emergency department for evaluation of a laceration on her right shin.  Patient reports she cut her leg on a wheelchair last night.  Unknown last tetanus shot.  Patient is not taking any blood thinners.  Bleeding has been controlled with pressure and gauze.  No fever.  Past Medical History:  Diagnosis Date   Arthritis    Rheumatoid arthritis   Celiac disease    Chronic kidney disease    stage 3 from MD notes   COPD (chronic obstructive pulmonary disease) ([place])    Dyspnea    with going up stairs   Family history of adverse reaction to anesthesia    father had hard time waking up   Headache    sinus headaches   Hot flashes    Hypertension    Iron deficiency anemia    Pneumonia    per patient "I have walking pneumonia"   Past Surgical History:  Procedure Laterality Date   COLONOSCOPY     ECTOPIC PREGNANCY SURGERY      x 2   IR IMAGING GUIDED PORT INSERTION  01/31/2022   REVERSE SHOULDER ARTHROPLASTY Right 02/02/2017   Procedure: RIGHT REVERSE SHOULDER ARTHROPLASTY;  Surgeon: Netta Cedars, MD;  Location: [facility];  Service: Orthopedics;  Laterality: Right;      Laceration      Home Medications Prior to Admission medications   Medication Sig Start Date End Date Taking? Authorizing Provider  mupirocin cream (BACTROBAN) 2 % Apply 1 Application topically 2 (two) times daily. 12/17/22  Yes Rex Kras, PA  denosumab (PROLIA) 60 MG/ML SOSY injection Inject 60 mg into the skin every 6 (six) months.    [provider]  diltiazem (CARDIZEM CD) 240 MG 24 hr capsule Take 1 capsule (240 mg total) by mouth daily. 08/20/22   Barrett, Evelene Croon, PA-C  fluconazole (DIFLUCAN) 150 MG tablet  Take 150 mg by mouth daily. 10/23/22   [provider]  furosemide (LASIX) 20 MG tablet Take 1 tablet (20 mg total) by mouth daily. 05/21/22 05/21/23  Kathie Dike, MD  gabapentin (NEURONTIN) 300 MG capsule Take 300 mg by mouth in the morning. 07/21/14   [provider]  InFLIXimab (REMICADE IV) Inject 10 mg into the vein every 2 (two) months.    [provider]  methylPREDNISolone (MEDROL DOSEPAK) 4 MG TBPK tablet Take 6 pills by mouth day 1, 5 on day 2, 4 on day 3, 3 on day 4, 2 on day 5, 1 on day 6 11/02/22   Walisiewicz, Harley Hallmark, PA-C  Metoprolol Tartrate 75 MG TABS Take 150 mg by mouth 2 (two) times daily. 07/24/22   [name], Wonda Cheng, MD  mirtazapine (REMERON) 7.5 MG tablet Take 1 tablet (7.5 mg total) by mouth at bedtime. 09/04/22   Truitt Merle, MD  nystatin cream (MYCOSTATIN) Apply topically 2 (two) times daily. 10/23/22   [provider]  potassium chloride SA (KLOR-CON M20) 20 MEQ tablet Take 1 tablet (20 mEq total) by mouth daily. Take with furosemide 06/02/22   [name], Wonda Cheng, MD  predniSONE (DELTASONE) 5 MG tablet Take 5 mg by mouth daily with breakfast. 12/12/21   Gavin Pound, MD  Tiotropium Bromide-Olodaterol (STIOLTO RESPIMAT) 2.5-2.5 MCG/ACT AERS Inhale 2 puffs into the lungs daily. 06/12/22   Garner Nash, DO      Allergies    Digoxin and related, Gluten meal, Penicillins, Alendronate, and Hydroxychloroquine    Review of Systems   Review of Systems Negative except as per HPI.  Physical Exam Updated Vital Signs BP 92/69 (BP Location: Left Arm)   Pulse 78   Temp 97.6 F (36.4 C) (Oral)   Resp 16   Ht 5' 2"  (1.575 m)   Wt 48.1 kg   SpO2 100%   BMI 19.39 kg/m  Physical Exam Vitals and nursing note reviewed.  Constitutional:      Appearance: Normal appearance.  HENT:     Head: Normocephalic and atraumatic.     Mouth/Throat:     Mouth: Mucous membranes are moist.  Eyes:     General: No scleral icterus. Cardiovascular:      Rate and Rhythm: Normal rate and regular rhythm.     Pulses: Normal pulses.     Heart sounds: Normal heart sounds.  Pulmonary:     Effort: Pulmonary effort is normal.     Breath sounds: Normal breath sounds.  Abdominal:     General: Abdomen is flat.     Palpations: Abdomen is soft.     Tenderness: There is no abdominal tenderness.  Musculoskeletal:        General: No deformity.  Skin:    General: Skin is warm.     Findings: No rash.     Comments: 6 cm curve laceration on L shin.  Neurological:     General: No focal deficit present.     Mental Status: She is alert.  Psychiatric:        Mood and Affect: Mood normal.     ED Results / Procedures / Treatments   Labs (all labs ordered are listed, but only abnormal results are displayed) Labs Reviewed - No data to display  EKG None  Radiology No results found.  Procedures .Marland KitchenLaceration Repair  Date/Time: 12/17/2022 10:22 AM  Performed by: Rex Kras, PA Authorized by: Rex Kras, PA   Consent:    Consent obtained:  Verbal   Consent given by:  Patient   Risks, benefits, and alternatives were discussed: yes     Risks discussed:  Infection and pain Universal protocol:    Procedure explained and questions answered to patient or proxy's satisfaction: yes     Relevant documents present and verified: yes     Patient identity confirmed:  Verbally with patient and arm band Anesthesia:    Anesthesia method:  Local infiltration   Local anesthetic:  Lidocaine 1% w/o epi Laceration details:    Location: L lower leg.   Length (cm):  7   Depth (mm):  3 Pre-procedure details:    Preparation:  Patient was prepped and draped in usual sterile fashion Exploration:    Limited defect created (wound extended): no     Hemostasis achieved with:  Direct pressure   Imaging outcome: foreign body not noted     Contaminated: no   Treatment:    Area cleansed with:  Chlorhexidine   Amount of cleaning:  Extensive   Irrigation solution:  Sterile  saline   Irrigation volume:  100   Irrigation method:  Pressure wash   Visualized foreign bodies/material removed: no     Debridement:  None   Undermining:  None Skin repair:    Repair method:  Sutures  Suture size:  3-0   Suture material:  Nylon   Number of sutures:  7 Approximation:    Approximation:  Close Repair type:    Repair type:  Simple Post-procedure details:    Dressing:  Antibiotic ointment   Procedure completion:  Tolerated well, no immediate complications     Medications Ordered in ED Medications  bacitracin ointment (has no administration in time range)  lidocaine (PF) (XYLOCAINE) 1 % injection 20 mL (20 mLs Intradermal Given by Other 12/17/22 1000)  Tdap (BOOSTRIX) injection 0.5 mL (0.5 mLs Intramuscular Given 12/17/22 0945)  oxyCODONE-acetaminophen (PERCOCET/ROXICET) 5-325 MG per tablet 1 tablet (1 tablet Oral Given 12/17/22 0945)    ED Course/ Medical Decision Making/ A&P                           Medical Decision Making Risk Prescription drug management.   This patient presents to the ED for laceration, this involves an extensive number of treatment options, and is a complaint that carries with a high risk of complications and morbidity.  The differential diagnosis includes laceration, cellulitis.  This is not an exhaustive list.  Lab tests:   Imaging studies:  Problem list/ ED course/ Critical interventions/ Medical management: HPI: See above Vital signs within normal range and stable throughout visit. Laboratory/imaging studies significant for: See above. On physical examination, patient is afebrile and appears in no acute distress. Wound inspected under direct bright light with good visualization. Area with linear laceration across soft tissue through adipose without exposure of muscle belly or tendon. No overt foreign body. Area hemostatic. Neurovascular exam congruent with above. Area extensively irrigated with sterile normal saline under  pressure. Laceration repaired in simple fashion as above (please see procedure note for further details). Patient tolerated procedure well and neurovascular exam intact and unchanged post repair with intact distal pulses and cap refill. Cautious return precautions discussed w/ full understanding. Wound care discussed. Prompt follow up with primary care physician discussed and return for suture removal in 10-14 days.  I sent an Rx of Bactroban and advised patient to apply on her leg every day.  Return to the ER if new or worsening symptoms. I have reviewed the patient home medicines and have made adjustments as needed.  Cardiac monitoring/EKG:  Additional history obtained:  Consultations obtained:  Disposition Continued outpatient therapy. Follow-up with PCP or urgent care recommended for reevaluation of symptoms. Treatment plan discussed with patient.  Pt acknowledged understanding was agreeable to the plan. Worrisome signs and symptoms were discussed with patient, and patient acknowledged understanding to return to the ED if they noticed these signs and symptoms. Patient was stable upon discharge.   This chart was dictated using voice recognition software.  Despite best efforts to proofread,  errors can occur which can change the documentation meaning.          Final Clinical Impression(s) / ED Diagnoses Final diagnoses:  Laceration of left lower extremity, initial encounter    Rx / DC Orders ED Discharge Orders          Ordered    mupirocin cream (BACTROBAN) 2 %  2 times daily        12/17/22 1034              Rex Kras, [place] 12/17/22 1038    Elgie Congo, MD 12/18/22 1526

## 2022-12-17 NOTE — ED Triage Notes (Addendum)
Pt has a lac on her right shin. Pt states that she cut her leg on her wheelchair last night. Unknown last tetanus. No thinners. Wound has been cleaned and dressed.

## 2022-12-20 ENCOUNTER — Ambulatory Visit (HOSPITAL_BASED_OUTPATIENT_CLINIC_OR_DEPARTMENT_OTHER): Payer: Medicare Other | Admitting: General Surgery

## 2022-12-26 ENCOUNTER — Inpatient Hospital Stay: Payer: Medicare Other | Attending: Nurse Practitioner

## 2022-12-26 ENCOUNTER — Other Ambulatory Visit: Payer: Self-pay

## 2022-12-26 DIAGNOSIS — C931 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: Secondary | ICD-10-CM

## 2022-12-26 DIAGNOSIS — Z5111 Encounter for antineoplastic chemotherapy: Secondary | ICD-10-CM | POA: Diagnosis not present

## 2022-12-26 LAB — CBC WITH DIFFERENTIAL/PLATELET
Abs Immature Granulocytes: 0.02 10*3/uL (ref 0.00–0.07)
Basophils Absolute: 0 10*3/uL (ref 0.0–0.1)
Basophils Relative: 1 %
Eosinophils Absolute: 0.6 10*3/uL — ABNORMAL HIGH (ref 0.0–0.5)
Eosinophils Relative: 13 %
HCT: 30.2 % — ABNORMAL LOW (ref 36.0–46.0)
Hemoglobin: 9.9 g/dL — ABNORMAL LOW (ref 12.0–15.0)
Immature Granulocytes: 0 %
Lymphocytes Relative: 35 %
Lymphs Abs: 1.7 10*3/uL (ref 0.7–4.0)
MCH: 36.3 pg — ABNORMAL HIGH (ref 26.0–34.0)
MCHC: 32.8 g/dL (ref 30.0–36.0)
MCV: 110.6 fL — ABNORMAL HIGH (ref 80.0–100.0)
Monocytes Absolute: 0.7 10*3/uL (ref 0.1–1.0)
Monocytes Relative: 14 %
Neutro Abs: 1.8 10*3/uL (ref 1.7–7.7)
Neutrophils Relative %: 37 %
Platelets: 37 10*3/uL — ABNORMAL LOW (ref 150–400)
RBC: 2.73 MIL/uL — ABNORMAL LOW (ref 3.87–5.11)
RDW: 16.1 % — ABNORMAL HIGH (ref 11.5–15.5)
WBC: 4.8 10*3/uL (ref 4.0–10.5)
nRBC: 0 % (ref 0.0–0.2)

## 2022-12-26 LAB — CMP (CANCER CENTER ONLY)
ALT: 11 U/L (ref 0–44)
AST: 18 U/L (ref 15–41)
Albumin: 3.3 g/dL — ABNORMAL LOW (ref 3.5–5.0)
Alkaline Phosphatase: 57 U/L (ref 38–126)
Anion gap: 7 (ref 5–15)
BUN: 17 mg/dL (ref 8–23)
CO2: 30 mmol/L (ref 22–32)
Calcium: 9 mg/dL (ref 8.9–10.3)
Chloride: 103 mmol/L (ref 98–111)
Creatinine: 0.91 mg/dL (ref 0.44–1.00)
GFR, Estimated: >60 mL/min (ref >60)
Glucose, Bld: 101 mg/dL — ABNORMAL HIGH (ref 70–99)
Potassium: 4.3 mmol/L (ref 3.5–5.1)
Sodium: 140 mmol/L (ref 135–145)
Total Bilirubin: 0.5 mg/dL (ref 0.3–1.2)
Total Protein: 5.4 g/dL — ABNORMAL LOW (ref 6.5–8.1)

## 2022-12-28 ENCOUNTER — Encounter (HOSPITAL_BASED_OUTPATIENT_CLINIC_OR_DEPARTMENT_OTHER): Payer: Medicare Other | Attending: General Surgery | Admitting: General Surgery

## 2022-12-28 DIAGNOSIS — N183 Chronic kidney disease, stage 3 unspecified: Secondary | ICD-10-CM | POA: Diagnosis not present

## 2022-12-28 DIAGNOSIS — I5022 Chronic systolic (congestive) heart failure: Secondary | ICD-10-CM | POA: Diagnosis not present

## 2022-12-28 DIAGNOSIS — S91311D Laceration without foreign body, right foot, subsequent encounter: Secondary | ICD-10-CM | POA: Insufficient documentation

## 2022-12-28 DIAGNOSIS — J449 Chronic obstructive pulmonary disease, unspecified: Secondary | ICD-10-CM | POA: Diagnosis not present

## 2022-12-28 DIAGNOSIS — S9031XA Contusion of right foot, initial encounter: Secondary | ICD-10-CM | POA: Diagnosis not present

## 2022-12-28 DIAGNOSIS — G629 Polyneuropathy, unspecified: Secondary | ICD-10-CM | POA: Diagnosis not present

## 2022-12-28 DIAGNOSIS — I890 Lymphedema, not elsewhere classified: Secondary | ICD-10-CM | POA: Diagnosis not present

## 2022-12-28 DIAGNOSIS — L97812 Non-pressure chronic ulcer of other part of right lower leg with fat layer exposed: Secondary | ICD-10-CM | POA: Diagnosis not present

## 2022-12-28 DIAGNOSIS — C931 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: Secondary | ICD-10-CM | POA: Insufficient documentation

## 2022-12-28 DIAGNOSIS — S81811D Laceration without foreign body, right lower leg, subsequent encounter: Secondary | ICD-10-CM | POA: Diagnosis not present

## 2022-12-28 DIAGNOSIS — E440 Moderate protein-calorie malnutrition: Secondary | ICD-10-CM | POA: Diagnosis not present

## 2022-12-28 DIAGNOSIS — M069 Rheumatoid arthritis, unspecified: Secondary | ICD-10-CM | POA: Insufficient documentation

## 2022-12-28 DIAGNOSIS — L97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 evidence of necrosis: Secondary | ICD-10-CM | POA: Diagnosis not present

## 2022-12-28 DIAGNOSIS — L304 Erythema intertrigo: Secondary | ICD-10-CM | POA: Diagnosis not present

## 2022-12-28 DIAGNOSIS — I4891 Unspecified atrial fibrillation: Secondary | ICD-10-CM | POA: Diagnosis not present

## 2022-12-28 DIAGNOSIS — S81811A Laceration without foreign body, right lower leg, initial encounter: Secondary | ICD-10-CM | POA: Diagnosis not present

## 2022-12-28 DIAGNOSIS — L97512 Non-pressure chronic ulcer of other part of right foot with fat layer exposed: Secondary | ICD-10-CM | POA: Diagnosis not present

## 2022-12-28 DIAGNOSIS — I130 Hypertensive heart and chronic kidney disease with heart failure and stage 1 through stage 4 chronic kidney disease, or unspecified chronic kidney disease: Secondary | ICD-10-CM | POA: Insufficient documentation

## 2022-12-28 NOTE — Progress Notes (Signed)
[name], [name] ([id number]) [id number] Page 1 of 10 Visit Report for 12/28/2022 Chief Complaint Document Details Patient Name: Date of Service: [name], [name] 12/28/2022 11:00 A M Medical Record Number: [id number] Patient Account Number: [id number] Date of Birth/Sex: Treating RN: 12-15-1946 (77 y.o. F) Primary Care Provider: Marcy Panning Other Clinician: Referring Provider: Treating Provider/Extender: Lacie Draft, ELA INE Weeks in Treatment: 14 Information Obtained from: Patient Chief Complaint Patient seen for complaints of Non-Healing Wounds. Electronic Signature(s) Signed: 12/28/2022 11:44:46 AM By: Fredirick Maudlin MD FACS Entered By: Fredirick Maudlin on 12/28/2022 11:44:46 -------------------------------------------------------------------------------- Debridement Details Patient Name: Date of Service: Sherry Masson RO N [name]. 12/28/2022 11:00 A M Medical Record Number: [id number] Patient Account Number: [id number] Date of Birth/Sex: Treating RN: [birth year] (77 y.o. [name] Primary Care Provider: Judie Bonus INE Other Clinician: Referring Provider: Treating Provider/Extender: Lacie Draft, ELA INE Weeks in Treatment: 14 Debridement Performed for Assessment: Wound #1 Right,Dorsal Foot Performed By: Physician Fredirick Maudlin, MD Debridement Type: Debridement Level of Consciousness (Pre-procedure): Awake and Alert Pre-procedure Verification/Time Out Yes - 11:35 Taken: Start Time: 11:35 Pain Control: Lidocaine 4% T opical Solution T Area Debrided (L x W): otal 0.6 (cm) x 0.4 (cm) = 0.24 (cm) Tissue and other material debrided: Non-Viable, Slough, Slough Level: Non-Viable Tissue Debridement Description: Selective/Open Wound Instrument: Curette Bleeding: Minimum Hemostasis Achieved: Pressure End Time: 11:36 Procedural Pain: 0 Post Procedural Pain: 0 Response to Treatment: Procedure was tolerated  well Level of Consciousness (Post- Responds to Painful Stimuli procedure): Post Debridement Measurements of Total Wound Length: (cm) 0.6 Width: (cm) 0.4 Depth: (cm) 0.1 Volume: (cm) 0.019 Character of Wound/Ulcer Post Debridement: Improved Post Procedure Diagnosis [name], [name] ([id number]) [phone].pdf Page 2 of 10 Same as Pre-procedure Notes Scribed for Dr. [name] by J.Scotton Electronic Signature(s) Signed: 12/28/2022 12:05:17 PM By: Dellie Catholic RN Signed: 12/28/2022 12:11:14 PM By: Fredirick Maudlin MD FACS Entered By: Dellie Catholic on 12/28/2022 11:43:29 -------------------------------------------------------------------------------- HPI Details Patient Name: Date of Service: Sherry Masson RO N [name]. 12/28/2022 11:00 A M Medical Record Number: [id number] Patient Account Number: [id number] Date of Birth/Sex: Treating RN: 1946/05/06 (77 y.o. F) Primary Care Provider: Marcy Panning Other Clinician: Referring Provider: Treating Provider/Extender: Lacie Draft, ELA INE Weeks in Treatment: 14 History of Present Illness HPI Description: ADMISSION 09/20/2022 This is a 77 year old woman with a past medical history significant for congestive heart failure, atrial fibrillation, rheumatoid arthritis on long-term steroid treatment, CMML currently receiving chemotherapy and moderate malnutrition. She presents to clinic today with a wound on the dorsum of her right foot. She says it started out as a bruise and then developed a bump which subsequently broke open. She says she was sent to our clinic by her oncologist. She is not diabetic and does not smoke. ABI in clinic today was 1.55. She has had formal segmental arterial Dopplers done, a little over a year ago, which did not show any evidence of occlusive vascular disease. On the dorsal aspect of the right foot, there is a wound with a lot of old hematoma, slough, eschar, and and nonviable  subcutaneous tissue. T the best of my o ability to discern, it does appear to involve the muscle layer, but there is no evidence of necrosis. The wound is pouring serous fluid; the patient has 3+ pitting edema to the bilateral lower extremities. 09/29/2022: The patient came to clinic today with nothing but a T overlying her wound. She is accompanied by her husband today. She has not  elfa communicated with her cardiologist regarding her fluid overload. She still has 3+ pitting edema bilaterally. The wound is still pouring serous fluid, although not quite as effusively as at her initial visit. There is still quite a bit of nonviable tissue and hematoma present. 10/13; patient's wound on the right lateral lower leg his eschared over and may well be on its way to healing. The real problem here is on the right dorsal foot just proximal to her toes. The patient is still puzzled by the cause of this. Looking at a picture on the patient's smart phone there is a suggestion that this might have been a hematoma which seems to fit what Dr. [name] had felt on her initial evaluation. They have been using iodoform packing kerlix and Coban. 10/20; right lower leg is healed. We are approved for snap VAC on the right foot. We will try to arrange home health but until then she is going to need a nurse visit on Tuesday 10/20/2022: It has been 2 weeks since I have seen the wound. It has improved significantly since then. She is currently in a snap VAC and I think this has made quite a difference. The overall wound measurements are smaller. There is no massive drainage, as there had been previously. There is some good granulation tissue forming underneath a bit of slough. 10/30/2022: The wound continues to improve with use of the snap VAC. The swelling in her foot is better. The undermining continues to close in. There is a layer of slough overlying good granulation tissue. 11/06/2022: The wound has contracted further.  The undermining is still present but the wound surface is better and her edema control is good. 11/13/2022: The wound is smaller again and the undermining has decreased as well. There is slough accumulation over a nice bed of granulation tissue. 11/20/2022: We did not have any snap VAC devices in clinic last week so the wound was just packed. It did well and is smaller with less undermining today. There is still slough present on the surface. 11/29/2022: The wound has contracted considerably and the undermining has resolved. There is still some slough on the wound surface. 12/13; slough on the surface of the wound required debridement however afterwards the granulation looks healthy the wound has been contracting. She has been using a snap VAC with collagen 12/13/2022: The wound was too small and superficial last week and the snap VAC was discontinued. They have been using Hydrofera Blue due to the hypertrophic granulation tissue. The wound has a little bit of slough on the surface. 12/28/2022: The wound on her dorsal foot continues to contract and is quite superficial at this point. There is a little bit of slough on the surface. Unfortunately, she struck her right medial leg on a hard surface on Christmas, resulting in a laceration. It was sutured in the emergency room. She just came from having the sutures removed. Overall, I do not think the skin is likely to survive, but for now there is no grossly open ulcer at this site. Electronic Signature(s) Signed: 12/28/2022 11:45:39 AM By: Fredirick Maudlin MD FACS Entered By: Fredirick Maudlin on 12/28/2022 11:45:39 [place], [name] ([id number]) [id number] Page 3 of 10 -------------------------------------------------------------------------------- Physical Exam Details Patient Name: Date of Service: [name], [name] 12/28/2022 11:00 A M Medical Record Number: [id number] Patient Account Number: [id number] Date of Birth/Sex:  Treating RN: Jan 04, 1946 (77 y.o. F) Primary Care Provider: Marcy Panning Other Clinician: Referring Provider: Treating Provider/Extender: Lacie Draft,  ELA INE Weeks in Treatment: 14 Constitutional . . . . No acute distress. Respiratory Normal work of breathing on room air. Notes 12/28/2022: The wound on her dorsal foot continues to contract and is quite superficial at this point. There is a little bit of slough on the surface. Unfortunately, she struck her right medial leg on a hard surface on Christmas, resulting in a laceration. It was sutured in the emergency room. She just came from having the sutures removed. Overall, I do not think the skin is likely to survive, but for now there is no grossly open ulcer at this site. Electronic Signature(s) Signed: 12/28/2022 11:53:24 AM By: Fredirick Maudlin MD FACS Entered By: Fredirick Maudlin on 12/28/2022 11:53:24 -------------------------------------------------------------------------------- Physician Orders Details Patient Name: Date of Service: Sherry Masson RO N [name]. 12/28/2022 11:00 A M Medical Record Number: [id number] Patient Account Number: [id number] Date of Birth/Sex: Treating RN: [birth year] (77 y.o. 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 - Dr. [name] Room 1 Return A Bathing/ Shower/ Hygiene Other Bathing/Shower/Hygiene Orders/Instructions: - Please do not get LEG WRAPS on Right leg wet. Sponge bathe or use a cast protector on right leg if showering/bathing. Edema Control - Lymphedema / SCD / Other Left Lower Extremity Avoid standing for long periods of time. Patient to wear own compression stockings every day. [name], [name] ([id number]) [id number] Page 4 of 10 Compression stocking or Garment 20-30 mm/Hg pressure to: - left leg Wound Treatment Wound #1 - Foot Wound Laterality: Dorsal, Right Cleanser: Soap and Water Every Other Day/15 Days Discharge Instructions: May shower and wash wound with dial antibacterial soap and water prior to dressing change. Cleanser: Wound Cleanser (Generic) Every Other Day/15 Days Discharge Instructions: Cleanse the wound with wound cleanser prior to applying a clean dressing using gauze sponges, not tissue or cotton balls. Prim Dressing: Sorbalgon AG Dressing 2x2 (in/in) Every Other Day/15 Days ary Discharge Instructions: Apply to wound bed as instructed Secondary Dressing: Woven Gauze Sponge, Non-Sterile 4x4 in (Generic) Every Other Day/15 Days Discharge Instructions: Apply over primary dressing as directed. Secondary Dressing: Zetuvit Plus Silicone Border Dressing 4x4 (in/in) (Generic) Every Other Day/15 Days Discharge Instructions: Apply silicone border over primary dressing as directed. Wound #3 - Lower Leg Wound Laterality: Right, Medial Cleanser: Soap and Water 1 x Per Week/30 Days Discharge Instructions: May shower and wash wound with dial antibacterial soap and water prior to dressing change. Cleanser: Wound Cleanser 1 x Per Week/30 Days Discharge Instructions: Cleanse the wound with wound cleanser prior to applying a clean dressing using gauze sponges, not tissue or cotton balls. Prim Dressing: Sorbalgon AG Dressing,  4x4 (in/in) 1 x Per Week/30 Days ary Discharge Instructions: Apply to wound bed as instructed Secondary Dressing: ABD Pad, 8x10 1 x Per Week/30 Days Discharge Instructions: Apply over primary dressing as directed. Compression Wrap: Kerlix Roll 4.5x3.1 (in/yd) 1 x Per Week/30 Days Discharge Instructions: Apply Kerlix and Coban compression as directed. Compression Wrap: Coban  Self-Adherent Wrap 4x5 (in/yd) 1 x Per Week/30 Days Discharge Instructions: Apply over Kerlix as directed. Electronic Signature(s) Signed: 12/28/2022 12:11:14 PM By: Fredirick Maudlin MD FACS Entered By: Fredirick Maudlin on 12/28/2022 11:53:36 -------------------------------------------------------------------------------- Problem List Details Patient Name: Date of Service: Sherry Masson RO N [name]. 12/28/2022 11:00 A M Medical Record Number: [id number] Patient Account Number: [id number] Date of Birth/Sex: Treating RN: April 17, 1946 (77 y.o. 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 09/20/2022 No Yes without evidence of necrosis L97.812 Non-pressure chronic ulcer of other part of right lower leg with fat layer 12/28/2022 No Yes exposed [place], [place] ([id number]) [phone].pdf Page 5 of 10 I94.85 Chronic systolic (congestive) heart failure 09/20/2022 No Yes M06.9 Rheumatoid arthritis, unspecified 09/20/2022 No Yes Z79.52 Long term (current) use of systemic steroids 09/20/2022 No Yes I70.90 Unspecified atherosclerosis 09/20/2022 No Yes 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 12/28/2022 11:00 A M Medical Record Number: [id number] Patient Account Number: [id number] Date of Birth/Sex: Treating RN: 1946/04/17 (77 y.o. F) Primary Care Provider: Marcy Panning Other Clinician: Referring Provider: Treating Provider/Extender: Lacie Draft, ELA INE Weeks in Treatment: 14 Subjective Chief Complaint Information obtained from Patient Patient seen for complaints of Non-Healing Wounds. History of Present Illness (HPI) ADMISSION 09/20/2022 This is a 77 year old woman with a past medical history significant for congestive heart failure, atrial fibrillation, rheumatoid arthritis on long-term steroid treatment, CMML currently receiving chemotherapy and moderate malnutrition. She presents to clinic today with a wound on the dorsum of her right foot. She says it started out as a bruise and then developed a bump which subsequently broke open. She says she was sent to our clinic by her oncologist. She is not diabetic and does not smoke. ABI in clinic today was 1.55. She has had formal segmental arterial Dopplers done, a little over a year ago, which did not show any evidence of occlusive vascular disease. On the dorsal aspect of the right foot, there is a wound with a lot of old hematoma, slough, eschar, and and nonviable subcutaneous tissue. T the best of my o ability to discern, it does appear to involve the muscle layer, but there is no evidence of necrosis. The wound is pouring serous fluid; the patient has 3+ pitting edema to the bilateral lower extremities. 09/29/2022: The  patient came to clinic today with nothing but a T overlying her wound. She is accompanied by her husband today. She has not elfa communicated with her cardiologist regarding her fluid overload. She still has 3+ pitting edema bilaterally. The wound is still pouring serous fluid, although not quite as effusively as at her initial visit. There is still quite a bit of nonviable tissue and hematoma present. 10/13; patient's wound on the right lateral lower leg his eschared over and may well be on its way to healing. The real problem here is on the right dorsal foot [name], [name] ([id number]) [phone].pdf Page  6 of 10 just proximal to her toes. The patient is still puzzled by the cause of this. Looking at a picture on the patient's smart phone there is a suggestion that this might have been a hematoma which seems to fit what Dr. [name] had felt on her initial evaluation. They have been using iodoform packing kerlix and Coban. 10/20; right lower leg is healed. We are approved for snap VAC on the right foot. We will try to arrange home health but until then she is going to need a nurse visit on Tuesday 10/20/2022: It has been 2 weeks since I have seen the wound. It has improved significantly since then. She is currently in a snap VAC and I think this has made quite a difference. The overall wound measurements are smaller. There is no massive drainage, as there had been previously. There is some good granulation tissue forming underneath a bit of slough. 10/30/2022: The wound continues to improve with use of the snap VAC. The swelling in her foot is better. The undermining continues to close in. There is a layer of slough overlying good granulation tissue. 11/06/2022: The wound has contracted further. The undermining is still present but the wound surface is better and her edema control is good. 11/13/2022: The wound is smaller again and the undermining has decreased as well.  There is slough accumulation over a nice bed of granulation tissue. 11/20/2022: We did not have any snap VAC devices in clinic last week so the wound was just packed. It did well and is smaller with less undermining today. There is still slough present on the surface. 11/29/2022: The wound has contracted considerably and the undermining has resolved. There is still some slough on the wound surface. 12/13; slough on the surface of the wound required debridement however afterwards the granulation looks healthy the wound has been contracting. She has been using a snap VAC with collagen 12/13/2022: The wound was too small and superficial last week and the snap VAC was discontinued. They have been using Hydrofera Blue due to the hypertrophic granulation tissue. The wound has a little bit of slough on the surface. 12/28/2022: The wound on her dorsal foot continues to contract and is quite superficial at this point. There is a little bit of slough on the surface. Unfortunately, she struck her right medial leg on a hard surface on Christmas, resulting in a laceration. It was sutured in the emergency room. She just came from having the sutures removed. Overall, I do not think the skin is likely to survive, but for now there is no grossly open ulcer at this site. Patient History Information obtained from Patient. Family History Unknown History, Diabetes - Father, Heart Disease - Father, No family history of Cancer. Social History Former smoker - quit 2003, Marital Status - Married, Alcohol Use - Daily, Drug Use - Prior History, Caffeine Use - Daily. Medical History Eyes Denies history of Cataracts, Glaucoma, Optic Neuritis Ear/Nose/Mouth/Throat Denies history of Chronic sinus problems/congestion, Middle ear problems Respiratory Patient has history of Asthma, Chronic Obstructive Pulmonary Disease (COPD) Cardiovascular Patient has history of Congestive Heart Failure, Hypertension Denies history of  Coronary Artery Disease, Deep Vein Thrombosis, Hypotension, Myocardial Infarction Endocrine Denies history of Type I Diabetes, Type II Diabetes Immunological Denies history of Lupus Erythematosus, Raynaudoos, Scleroderma Integumentary (Skin) Denies history of History of Burn Musculoskeletal Patient has history of Rheumatoid Arthritis Denies history of Gout Neurologic Patient has history of Neuropathy Oncologic Patient has history of Received Chemotherapy - currently  receiving Hospitalization/Surgery History - reverse shoulder arthroplasty Rt- 2018. Medical A Surgical History Notes nd Cardiovascular AFIB Genitourinary Stage 3 CKD Objective Constitutional No acute distress. Vitals Time Taken: 11:14 AM, Height: 64 in, Weight: 111 lbs, BMI: 19.1, Temperature: 97.5 F, Pulse: 64 bpm, Respiratory Rate: 20 breaths/min, Blood Pressure: [name], [name] ([id number]) [phone].pdf Page 7 of 10 103/65 mmHg. Respiratory Normal work of breathing on room air. General Notes: 12/28/2022: The wound on her dorsal foot continues to contract and is quite superficial at this point. There is a little bit of slough on the surface. Unfortunately, she struck her right medial leg on a hard surface on Christmas, resulting in a laceration. It was sutured in the emergency room. She just came from having the sutures removed. Overall, I do not think the skin is likely to survive, but for now there is no grossly open ulcer at this site. Integumentary (Hair, Skin) Wound #1 status is Open. Original cause of wound was Blister. The date acquired was: 08/23/2022. The wound has been in treatment 14 weeks. The wound is located on the Right,Dorsal Foot. The wound measures 0.6cm length x 0.4cm width x 0.1cm depth; 0.188cm^2 area and 0.019cm^3 volume. There is Fat Layer (Subcutaneous Tissue) exposed. There is no tunneling or undermining noted. There is a medium amount of serosanguineous drainage  noted. The wound margin is distinct with the outline attached to the wound base. There is large (67-100%) pink granulation within the wound bed. There is a small (1-33%) amount of necrotic tissue within the wound bed including Adherent Slough. The periwound skin appearance had no abnormalities noted for texture. The periwound skin appearance exhibited: Dry/Scaly. The periwound skin appearance did not exhibit: Erythema. Periwound temperature was noted as No Abnormality. Wound #3 status is Open. Original cause of wound was Laceration. The date acquired was: 12/18/2022. The wound is located on the Right,Medial Lower Leg. The wound measures 5.5cm length x 2.5cm width x 0.1cm depth; 10.799cm^2 area and 1.08cm^3 volume. There is Fat Layer (Subcutaneous Tissue) exposed. There is no tunneling or undermining noted. There is a medium amount of serosanguineous drainage noted. There is small (1-33%) red granulation within the wound bed. There is a large (67-100%) amount of necrotic tissue within the wound bed including Eschar and Adherent Slough. The periwound skin appearance had no abnormalities noted for texture. The periwound skin appearance had no abnormalities noted for moisture. The periwound skin appearance had no abnormalities noted for color. Periwound temperature was noted as No Abnormality. 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 There was a Selective/Open Wound Non-Viable Tissue Debridement with a  total area of 0.24 sq cm performed by Fredirick Maudlin, MD. With the following instrument(s): Curette to remove Non-Viable tissue/material. Material removed includes [facility] after achieving pain control using Lidocaine 4% Topical Solution. No specimens were taken. A time out was conducted at 11:35, prior to the start of the procedure. A Minimum amount of bleeding was controlled with Pressure. The procedure was tolerated well with a pain level of 0 throughout and a pain level of 0 following the procedure. Post Debridement Measurements: 0.6cm length  x 0.4cm width x 0.1cm depth; 0.019cm^3 volume. Character of Wound/Ulcer Post Debridement is improved. Post procedure Diagnosis Wound #1: Same as Pre-Procedure General Notes: Scribed for Dr. [name] by J.Scotton. Plan Follow-up Appointments: Return Appointment in 1 week. - Dr. [name] Room 1 Bathing/ Shower/ Hygiene: Other Bathing/Shower/Hygiene Orders/Instructions: - Please do not get LEG WRAPS on Right leg wet. Sponge bathe or use a cast protector on right leg if showering/bathing. Edema Control - Lymphedema / SCD / Other: Avoid standing for long periods of time. Patient to wear own compression stockings every day. Compression stocking or Garment 20-30 mm/Hg pressure to: - left leg WOUND #1: - Foot Wound Laterality: Dorsal, Right Cleanser: Soap and Water Every Other Day/15 Days Discharge Instructions: May shower and wash wound with dial antibacterial soap and water prior to dressing change. Cleanser: Wound Cleanser (Generic) Every Other Day/15 Days Discharge Instructions: Cleanse the wound with wound cleanser prior to applying a clean dressing using gauze sponges, not tissue or cotton balls. Prim Dressing: Sorbalgon AG Dressing 2x2 (in/in) Every Other Day/15 Days ary Discharge Instructions: Apply to wound bed as instructed Secondary Dressing: Woven Gauze Sponge, Non-Sterile 4x4 in (Generic) Every Other Day/15 Days Discharge Instructions: Apply over  primary dressing as directed. Secondary Dressing: Zetuvit Plus Silicone Border Dressing 4x4 (in/in) (Generic) Every Other Day/15 Days Discharge Instructions: Apply silicone border over primary dressing as directed. WOUND #3: - Lower Leg Wound Laterality: Right, Medial Cleanser: Soap and Water 1 x Per Week/30 Days Discharge Instructions: May shower and wash wound with dial antibacterial soap and water prior to dressing change. [name], [name] ([id number]) [id number] Page 8 of 10 Cleanser: Wound Cleanser 1 x Per Week/30 Days Discharge Instructions: Cleanse the wound with wound cleanser prior to applying a clean dressing using gauze sponges, not tissue or cotton balls. Prim Dressing: Sorbalgon AG Dressing, 4x4 (in/in) 1 x Per Week/30 Days ary Discharge Instructions: Apply to wound bed as instructed Secondary Dressing: ABD Pad, 8x10 1 x Per Week/30 Days Discharge Instructions: Apply over primary dressing as directed. Com pression Wrap: Kerlix Roll 4.5x3.1 (in/yd) 1 x Per Week/30 Days Discharge Instructions: Apply Kerlix and Coban compression as directed. Com pression Wrap: Coban Self-Adherent Wrap 4x5 (in/yd) 1 x Per Week/30 Days Discharge Instructions: Apply over Kerlix as directed. 12/28/2022: The wound on her dorsal foot continues to contract and is quite superficial at this point. There is a little bit of slough on the surface. Unfortunately, she struck her right medial leg on a hard surface on Christmas, resulting in a laceration. It was sutured in the emergency room. She just came from having the sutures removed. Overall, I do not think the skin is likely to survive, but for now there is no grossly open ulcer at this site. I used a curette to debride slough from the dorsal foot wound. I elected to leave the medial leg wound alone today although I suspect that the skin will likely slough off. We will use silver alginate to both sites and apply Kerlix and Coban wrap.  Follow-up in 1 week. Electronic Signature(s) Signed: 12/28/2022 11:54:19 AM By: Fredirick Maudlin MD FACS Entered By: Fredirick Maudlin on 12/28/2022 11:54:19 -------------------------------------------------------------------------------- HxROS Details Patient Name: Date of Service: Sherry Masson RO N [name]. 12/28/2022 11:00 A M Medical Record Number: [id number] Patient Account Number: [id number] Date of Birth/Sex: Treating RN: 18-Mar-1946 (77 y.o. F) Primary Care Provider: Marcy Panning Other Clinician: Referring Provider: Treating Provider/Extender: Lacie Draft, ELA INE Weeks in Treatment: 14 Information Obtained From Patient  Eyes Medical History: Negative for: Cataracts; Glaucoma; Optic Neuritis Ear/Nose/Mouth/Throat Medical History: Negative for: Chronic sinus problems/congestion; Middle ear problems Respiratory Medical History: Positive for: Asthma; Chronic Obstructive Pulmonary Disease (COPD) Cardiovascular Medical History: Positive for: Congestive Heart Failure; Hypertension Negative for: Coronary Artery Disease; Deep Vein Thrombosis; Hypotension; Myocardial Infarction Past Medical History Notes: AFIB Endocrine Medical History: Negative for: Type I Diabetes; Type II Diabetes Genitourinary Medical History: Past Medical History Notes: Stage 3 CKD [name], [name] ([id number]) [id number] Page 9 of 10 Immunological Medical History: Negative for: Lupus Erythematosus; Raynauds; Scleroderma Integumentary (Skin) Medical History: Negative for: History of Burn Musculoskeletal Medical History: Positive for: Rheumatoid Arthritis Negative for: Gout Neurologic Medical History: Positive for: Neuropathy Oncologic Medical History: Positive for: Received Chemotherapy - currently receiving Immunizations Pneumococcal Vaccine: Received Pneumococcal Vaccination: Yes Received Pneumococcal Vaccination On or After 60th Birthday:  Yes Implantable Devices None Hospitalization / Surgery History Type of Hospitalization/Surgery reverse shoulder arthroplasty Rt- 2018 Family and Social History Unknown History: Yes; Cancer: No; Diabetes: Yes - Father; Heart Disease: Yes - Father; Former smoker - quit 2003; Marital Status - Married; Alcohol Use: Daily; Drug Use: Prior History; Caffeine Use: Daily; Financial Concerns: No; Food, Clothing or Shelter Needs: No; Support System Lacking: No; Transportation Concerns: No Electronic Signature(s) Signed: 12/28/2022 12:11:14 PM By: Fredirick Maudlin MD FACS Entered By: Fredirick Maudlin on 12/28/2022 11:45:45 -------------------------------------------------------------------------------- SuperBill Details Patient Name: Date of Service: Sherry Masson RO Ovidio Hanger. 12/28/2022 Medical Record Number: [id number] Patient Account Number: [id number] Date of Birth/Sex: Treating RN: 12-May-1946 (77 y.o. 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 exposed A55.25 Chronic systolic (congestive) heart failure [place], Averianna [name] ([id number]) [id number].pdf Page 10 of 10 M06.9 Rheumatoid arthritis, unspecified Z79.52 Long term (current) use of systemic steroids I70.90 Unspecified atherosclerosis 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 Facility Procedures : CPT4 Code: [id number] Description: [phone] - DEBRIDE WOUND 1ST 20 SQ CM OR < ICD-10 Diagnosis Description 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 wi Modifier: thout evidence of n Quantity: 1  ecrosis Physician Procedures : CPT4 Code Description Modifier [id number] - WC PHYS LEVEL 3 - EST PT 25 ICD-10 Diagnosis Description 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

## 2022-12-28 NOTE — Progress Notes (Addendum)
Sherry, [name] ([id number]) [id number] Page 1 of 9 Visit Report for 12/28/2022 Arrival Information Details Patient Name: Date of Service: [name], [name] 12/28/2022 11:00 A M Medical Record Number: [id number] Patient Account Number: [id number] Date of Birth/Sex: Treating RN: 1946-02-14 (77 y.o. F) Primary Care [name]: [name] INE Other Clinician: Referring Janathan Bribiesca: Treating Kailah Pennel/Extender: Lacie Draft, ELA INE Weeks in Treatment: 14 Visit Information History Since Last Visit All ordered tests and consults were completed: No Patient Arrived: Wheel Chair Added or deleted any medications: No Arrival Time: 11:13 Any new allergies or adverse reactions: No Accompanied By: husband Had a fall or experienced change in No Transfer Assistance: Manual activities of daily living that may affect Patient Identification Verified: Yes risk of falls: Secondary Verification Process Completed: Yes Signs or symptoms of abuse/neglect since last visito No Patient Requires Transmission-Based Precautions: No Hospitalized since last visit: No Patient Has Alerts: No Implantable device outside of the clinic excluding No cellular tissue based products placed in the center since last visit: Pain Present Now: No Electronic Signature(s) Signed: 12/28/2022 12:05:17 PM By: Dellie Catholic RN Previous Signature: 12/28/2022 11:21:59 AM Version By: Worthy Rancher Entered By: Dellie Catholic on 12/28/2022 11:25:05 -------------------------------------------------------------------------------- Encounter Discharge Information Details Patient Name: Date of Service: [name] [name] 12/28/2022 11:00 A M Medical Record Number: [id number] Patient Account Number: [id number] Date of Birth/Sex: Treating RN: 01/28/1946 (77 y.o. [name] Primary Care [name]: [name] Other Clinician: Referring [name]: Treating [name]/Extender: Lacie Draft, ELA INE Weeks in Treatment: 61 Encounter Discharge Information Items Post Procedure Vitals Discharge Condition: Stable Temperature (F): 97.5 Ambulatory Status: Wheelchair Pulse (bpm): 64 Discharge Destination: Home Respiratory Rate (breaths/min): 20 Transportation: Private Auto Blood Pressure (mmHg): 103/65 Accompanied By: spouse Schedule Follow-up Appointment: Yes Clinical Summary of Care: Patient Declined Electronic Signature(s) Signed: 12/28/2022 12:05:17 PM By: Dellie Catholic RN Entered By: Dellie Catholic on 12/28/2022 12:04:23 [name] ([id number]) [id number] Page 2 of 9 -------------------------------------------------------------------------------- Lower Extremity Assessment Details Patient Name: Date of Service: [name], [name] 12/28/2022 11:00 A M Medical Record Number: [id number] Patient Account Number: [id number] Date of Birth/Sex: Treating RN: 19-Jun-1946 (77 y.o. [name] Primary Care Angelus [name]: [name] Other Clinician: Referring Jackeline Gutknecht: Treating Jahkai [name]/Extender: Lacie Draft, ELA INE Weeks in Treatment: 14 Edema Assessment Assessed: [Left: No] [Right: No] Edema: [Left: N] [Right: o] Calf Left: Right: Point of Measurement: From Medial Instep 29 cm Ankle Left: Right: Point of Measurement: From Medial Instep 23 cm Vascular Assessment Pulses: Dorsalis Pedis Palpable: [Right:Yes] Electronic Signature(s) Signed: 12/28/2022 12:05:17 PM By: Dellie Catholic RN Entered By: Dellie Catholic on 12/28/2022 11:25:30 -------------------------------------------------------------------------------- Multi Wound Chart Details Patient Name: Date of Service: [name] [name] 12/28/2022 11:00 A M Medical Record Number: [id number] Patient Account Number: [id number] Date of Birth/Sex: Treating RN: 25-Nov-1946 (77 y.o. F) Primary Care [name]: [name] INE Other Clinician: Referring  [name]: Treating [name]/Extender: Lacie Draft, ELA INE Weeks in Treatment: 14 Vital Signs Height(in): 64 Pulse(bpm): 64 Weight(lbs): 111 Blood Pressure(mmHg): 103/65 Body Mass Index(BMI): 19.1 Temperature(F): 97.5 Respiratory Rate(breaths/min): 20 [1:Photos:] [N/A:N/A [id number] Page 3 of 9] Right, Dorsal Foot Right, Medial Lower Leg N/A Wound Location: Blister Laceration N/A Wounding Event: Lymphedema Trauma, Other N/A Primary Etiology: Asthma, Chronic Obstructive Asthma, Chronic Obstructive N/A Comorbid History: Pulmonary Disease (COPD), Pulmonary Disease (COPD), Congestive Heart Failure, Congestive Heart Failure, Hypertension, Rheumatoid Arthritis, Hypertension, Rheumatoid Arthritis, Neuropathy, Received Chemotherapy Neuropathy, Received Chemotherapy 08/23/2022 12/18/2022 N/A Date Acquired:  14 0 N/A Weeks of Treatment: Open Open N/A Wound Status: No No N/A Wound Recurrence: 0.6x0.4x0.1 5.5x2.5x0.1 N/A Measurements L x W x D (cm) 0.188 10.799 N/A A (cm) : rea 0.019 1.08 N/A Volume (cm) : 81.90% N/A N/A % Reduction in A rea: 96.90% N/A N/A % Reduction in Volume: Full Thickness Without Exposed Full Thickness Without Exposed N/A Classification: Support Structures Support Structures Medium Medium N/A Exudate A mount: Serosanguineous Serosanguineous N/A Exudate Type: red, brown red, brown N/A Exudate Color: Distinct, outline attached N/A N/A Wound Margin: Large (67-100%) Small (1-33%) N/A Granulation A mount: Pink Red N/A Granulation Quality: Small (1-33%) Large (67-100%) N/A Necrotic A mount: Adherent Slough Eschar, Adherent Slough N/A Necrotic Tissue: Fat Layer (Subcutaneous Tissue): Yes Fat Layer (Subcutaneous Tissue): Yes N/A Exposed Structures: Fascia: No Fascia: No Tendon: No Tendon: No Muscle: No Muscle: No Joint: No Joint: No Bone: No Bone: No None N/A N/A Epithelialization: Debridement -  Selective/Open Wound N/A N/A Debridement: Pre-procedure Verification/Time Out 11:35 N/A N/A Taken: Lidocaine 4% Topical Solution N/A N/A Pain Control: Slough N/A N/A Tissue Debrided: Non-Viable Tissue N/A N/A Level: 0.24 N/A N/A Debridement A (sq cm): rea Curette N/A N/A Instrument: Minimum N/A N/A Bleeding: Pressure N/A N/A Hemostasis A chieved: 0 N/A N/A Procedural Pain: 0 N/A N/A Post Procedural Pain: Procedure was tolerated well N/A N/A Debridement Treatment Response: 0.6x0.4x0.1 N/A N/A Post Debridement Measurements L x W x D (cm) 0.019 N/A N/A Post Debridement Volume: (cm) No Abnormalities Noted No Abnormalities Noted N/A Periwound Skin Texture: Dry/Scaly: Yes No Abnormalities Noted N/A Periwound Skin Moisture: Erythema: No No Abnormalities Noted N/A Periwound Skin Color: No Abnormality No Abnormality N/A Temperature: Debridement N/A N/A Procedures Performed: Treatment Notes Electronic Signature(s) Signed: 12/28/2022 11:44:40 AM By: Fredirick Maudlin MD FACS Entered By: Fredirick Maudlin on 12/28/2022 11:44:40 -------------------------------------------------------------------------------- Multi-Disciplinary Care Plan Details Patient Name: Date of Service: [name] [name] 12/28/2022 11:00 A M Medical Record Number: [id number] Patient Account Number: [id number] Date of Birth/Sex: Treating RN: [birth year] (77 y.o. [name] Primary Care Ermelinda Eckert: [name] Other Clinician: Referring Delani Kohli: Treating Kebin Maye/Extender: Lacie Draft, ELA INE Weeks in Treatment: [address], [place] ([id number]) [phone].pdf Page 4 of 9 Active Inactive Peripheral Neuropathy Nursing Diagnoses: Potential alteration in peripheral tissue perfusion (select prior to confirmation of diagnosis) Goals: Patient/caregiver will verbalize understanding of disease process and disease management Date Initiated: 09/20/2022 Target  Resolution Date: 03/25/2023 Goal Status: Active Interventions: Provide education on Management of Neuropathy and Related Ulcers Treatment Activities: Patient referred for customized footwear/offloading : 09/20/2022 Notes: Wound/Skin Impairment Nursing Diagnoses: Knowledge deficit related to ulceration/compromised skin integrity Goals: Ulcer/skin breakdown will have a volume reduction of 30% by week 4 Date Initiated: 09/20/2022 Date Inactivated: 11/29/2022 Target Resolution Date: 11/30/2022 Goal Status: Met Ulcer/skin breakdown will have a volume reduction of 50% by week 8 Date Initiated: 11/29/2022 Target Resolution Date: 03/25/2023 Goal Status: Active Interventions: Assess ulceration(s) every visit Treatment Activities: Skin care regimen initiated : 09/20/2022 Topical wound management initiated : 09/20/2022 Notes: Electronic Signature(s) Signed: 12/28/2022 12:05:17 PM By: Dellie Catholic RN Entered By: Dellie Catholic on 12/28/2022 12:02:59 -------------------------------------------------------------------------------- Pain Assessment Details Patient Name: Date of Service: [name] [name] 12/28/2022 11:00 A M Medical Record Number: [id number] Patient Account Number: [id number] Date of Birth/Sex: Treating RN: December 17, 1946 (77 y.o. F) Primary Care Anterio Scheel: [name] Other Clinician: Referring Shakyra Mattera: Treating Neko Mcgeehan/Extender: Lacie Draft, ELA INE Weeks in Treatment: 14 Active Problems Location of Pain Severity and Description  of Pain Patient Has Paino No Site Locations Rate the pain. MAURINE, MOWBRAY ([id number]) [id number] Page 5 of 9 Rate the pain. Current Pain Level: 5 Worst Pain Level: 10 Least Pain Level: 0 Tolerable Pain Level: 3 Pain Management and Medication Current Pain Management: Electronic Signature(s) Signed: 12/28/2022 11:21:59 AM By: Worthy Rancher Entered By: Worthy Rancher on 12/28/2022  11:14:36 -------------------------------------------------------------------------------- Patient/Caregiver Education Details Patient Name: Date of Service: Alroy Dust 1/4/2024andnbsp11:00 A M Medical Record Number: [id number] Patient Account Number: [id number] Date of Birth/Gender: Treating RN: [birth year] (77 y.o. [name] Primary Care Physician: [name] INE Other Clinician: Referring Physician: Treating Physician/Extender: Lacie Draft, ELA INE Weeks in Treatment: 14 Education Assessment Education Provided To: Patient Education Topics Provided Wound/Skin Impairment: Methods: Explain/Verbal Responses: Return demonstration correctly Electronic Signature(s) Signed: 12/28/2022 12:05:17 PM By: Dellie Catholic RN Entered By: Dellie Catholic on 12/28/2022 12:03:15 -------------------------------------------------------------------------------- Wound Assessment Details Patient Name: Date of Service: [name] [name] 12/28/2022 11:00 A M Medical Record Number: [id number] Patient Account Number: [id number] Date of Birth/Sex: Treating RN: Aug 07, 1946 (77 y.o. F) Primary Care Janette Harvie: [name] Other Clinician: Referring Denorris Reust: Treating Misha Vanoverbeke/Extender: Lacie Draft, ELA INE [place], [place] ([id number]) [id number] Page 6 of 9 Weeks in Treatment: 14 Wound Status Wound Number: 1 Primary Lymphedema Etiology: Wound Location: Right, Dorsal Foot Wound Open Wounding Event: Blister Status: Date Acquired: 08/23/2022 Comorbid Asthma, Chronic Obstructive Pulmonary Disease (COPD), Weeks Of Treatment: 14 History: Congestive Heart Failure, Hypertension, Rheumatoid Arthritis, Clustered Wound: No Neuropathy, Received Chemotherapy Photos Wound Measurements Length: (cm) 0.6 Width: (cm) 0.4 Depth: (cm) 0.1 Area: (cm) 0.188 Volume: (cm) 0.019 % Reduction in Area: 81.9% % Reduction in Volume:  96.9% Epithelialization: None Tunneling: No Undermining: No Wound Description Classification: Full Thickness Without Exposed Support Structures Wound Margin: Distinct, outline attached Exudate Amount: Medium Exudate Type: Serosanguineous Exudate Color: red, brown Foul Odor After Cleansing: No Slough/Fibrino Yes Wound Bed Granulation Amount: Large (67-100%) Exposed Structure Granulation Quality: Pink Fascia Exposed: No Necrotic Amount: Small (1-33%) Fat Layer (Subcutaneous Tissue) Exposed: Yes Necrotic Quality: Adherent Slough Tendon Exposed: No Muscle Exposed: No Joint Exposed: No Bone Exposed: No Periwound Skin Texture Texture Color No Abnormalities Noted: Yes No Abnormalities Noted: No Erythema: No Moisture No Abnormalities Noted: No Temperature / Pain Dry / Scaly: Yes Temperature: No Abnormality Treatment Notes Wound #1 (Foot) Wound Laterality: Dorsal, Right Cleanser Soap and Water Discharge Instruction: May shower and wash wound with dial antibacterial soap and water prior to dressing change. Wound Cleanser Discharge Instruction: Cleanse the wound with wound cleanser prior to applying a clean dressing using gauze sponges, not tissue or cotton balls. Peri-Wound Care Topical Primary Dressing Sorbalgon AG Dressing 2x2 (in/in) Discharge Instruction: Apply to wound bed as instructed Secondary Dressing MARLEA, GAMBILL ([id number]) [id number] Page 7 of 9 Woven Gauze Sponge, Non-Sterile 4x4 in Discharge Instruction: Apply over primary dressing as directed. Zetuvit Plus Silicone Border Dressing 4x4 (in/in) Discharge Instruction: Apply silicone border over primary dressing as directed. Secured With Compression Wrap Compression Stockings Environmental education officer) Signed: 12/28/2022 12:05:17 PM By: Dellie Catholic RN Previous Signature: 12/28/2022 11:21:59 AM Version By: Worthy Rancher Entered By: Dellie Catholic on 12/28/2022  11:25:52 -------------------------------------------------------------------------------- Wound Assessment Details Patient Name: Date of Service: [name] [name] 12/28/2022 11:00 A M Medical Record Number: [id number] Patient Account Number: [id number] Date of Birth/Sex: Treating RN: 15-Jan-1946 (77 y.o. [name] Primary Care Jaklyn Alen: [name] Other Clinician: Referring Emiya Loomer: Treating Serenitee Fuertes/Extender: Fredirick Maudlin  GRIFFIN, ELA INE Weeks in Treatment: 14 Wound Status Wound Number: 3 Primary Trauma, Other Etiology: Wound Location: Right, Medial Lower Leg Wound Open Wounding Event: Laceration Status: Date Acquired: 12/18/2022 Comorbid Asthma, Chronic Obstructive Pulmonary Disease (COPD), Weeks Of Treatment: 0 History: Congestive Heart Failure, Hypertension, Rheumatoid Arthritis, Clustered Wound: No Neuropathy, Received Chemotherapy Photos Wound Measurements Length: (cm) 5.5 Width: (cm) 2.5 Depth: (cm) 0.1 Area: (cm) 10.799 Volume: (cm) 1.08 % Reduction in Area: % Reduction in Volume: Tunneling: No Undermining: No Wound Description Classification: Full Thickness Without Exposed Support Structures Exudate Amount: Medium Exudate Type: Serosanguineous Exudate Color: red, brown Foul Odor After Cleansing: No Slough/Fibrino Yes Wound Bed Granulation Amount: Small (1-33%) Exposed Structure Granulation Quality: Red Fascia Exposed: No Necrotic Amount: Large (67-100%) Fat Layer (Subcutaneous Tissue) Exposed: Yes Rodrigues, Nijah H ([id number]) [phone].pdf Page 8 of 9 Necrotic Quality: Eschar, Adherent Slough Tendon Exposed: No Muscle Exposed: No Joint Exposed: No Bone Exposed: No Periwound Skin Texture Texture Color No Abnormalities Noted: Yes No Abnormalities Noted: Yes Moisture Temperature / Pain No Abnormalities Noted: Yes Temperature: No Abnormality Treatment Notes Wound #3 (Lower Leg) Wound Laterality: Right,  Medial Cleanser Soap and Water Discharge Instruction: May shower and wash wound with dial antibacterial soap and water prior to dressing change. Wound Cleanser Discharge Instruction: Cleanse the wound with wound cleanser prior to applying a clean dressing using gauze sponges, not tissue or cotton balls. Peri-Wound Care Topical Primary Dressing Sorbalgon AG Dressing, 4x4 (in/in) Discharge Instruction: Apply to wound bed as instructed Secondary Dressing ABD Pad, 8x10 Discharge Instruction: Apply over primary dressing as directed. Secured With Compression Wrap Kerlix Roll 4.5x3.1 (in/yd) Discharge Instruction: Apply Kerlix and Coban compression as directed. Coban Self-Adherent Wrap 4x5 (in/yd) Discharge Instruction: Apply over Kerlix as directed. Compression Stockings Add-Ons Electronic Signature(s) Signed: 12/28/2022 12:05:17 PM By: Dellie Catholic RN Entered By: Dellie Catholic on 12/28/2022 11:34:19 -------------------------------------------------------------------------------- Vitals Details Patient Name: Date of Service: [name] [name] 12/28/2022 11:00 A M Medical Record Number: [id number] Patient Account Number: [id number] Date of Birth/Sex: Treating RN: [birth year] (77 y.o. F) Primary Care Clova Morlock: [name] INE Other Clinician: Referring Cullen Vanallen: Treating Timotheus Salm/Extender: Lacie Draft, ELA INE Weeks in Treatment: 14 Vital Signs Time Taken: 11:14 Temperature (F): 97.5 Height (in): 64 Pulse (bpm): 64 Weight (lbs): 111 Respiratory Rate (breaths/min): 20 Body Mass Index (BMI): 19.1 Blood Pressure (mmHg): 103/65 Reference Range: 80 - 120 mg / dl Donson, Caidence H ([id number]) [phone].pdf Page 9 of 9 Electronic Signature(s) Signed: 12/28/2022 11:21:59 AM By: Worthy Rancher Entered By: Worthy Rancher on 12/28/2022 11:14:21

## 2022-12-29 ENCOUNTER — Encounter: Payer: Self-pay | Admitting: Hematology

## 2023-01-03 ENCOUNTER — Encounter (HOSPITAL_BASED_OUTPATIENT_CLINIC_OR_DEPARTMENT_OTHER): Payer: Medicare Other | Admitting: General Surgery

## 2023-01-03 ENCOUNTER — Encounter: Payer: Self-pay | Admitting: Cardiovascular Disease

## 2023-01-03 ENCOUNTER — Encounter: Payer: Self-pay | Admitting: Hematology

## 2023-01-03 ENCOUNTER — Ambulatory Visit: Payer: Medicare Other | Attending: Cardiovascular Disease | Admitting: Cardiovascular Disease

## 2023-01-03 VITALS — BP 122/67 | HR 74 | Ht 62.0 in | Wt 106.2 lb

## 2023-01-03 DIAGNOSIS — L97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 evidence of necrosis: Secondary | ICD-10-CM | POA: Diagnosis not present

## 2023-01-03 DIAGNOSIS — I890 Lymphedema, not elsewhere classified: Secondary | ICD-10-CM | POA: Diagnosis not present

## 2023-01-03 DIAGNOSIS — S91311D Laceration without foreign body, right foot, subsequent encounter: Secondary | ICD-10-CM | POA: Diagnosis not present

## 2023-01-03 DIAGNOSIS — J90 Pleural effusion, not elsewhere classified: Secondary | ICD-10-CM | POA: Diagnosis not present

## 2023-01-03 DIAGNOSIS — G629 Polyneuropathy, unspecified: Secondary | ICD-10-CM | POA: Diagnosis not present

## 2023-01-03 DIAGNOSIS — L97512 Non-pressure chronic ulcer of other part of right foot with fat layer exposed: Secondary | ICD-10-CM | POA: Diagnosis not present

## 2023-01-03 DIAGNOSIS — S81811A Laceration without foreign body, right lower leg, initial encounter: Secondary | ICD-10-CM | POA: Diagnosis not present

## 2023-01-03 DIAGNOSIS — R600 Localized edema: Secondary | ICD-10-CM

## 2023-01-03 DIAGNOSIS — S9031XA Contusion of right foot, initial encounter: Secondary | ICD-10-CM | POA: Diagnosis not present

## 2023-01-03 DIAGNOSIS — L97812 Non-pressure chronic ulcer of other part of right lower leg with fat layer exposed: Secondary | ICD-10-CM | POA: Diagnosis not present

## 2023-01-03 NOTE — Patient Instructions (Signed)
Medication Instructions:  Your physician recommends that you continue on your current medications as directed. Please refer to the Current Medication list given to you today.  *If you need a refill on your cardiac medications before your next appointment, please call your pharmacy*   Lab Work: NONE If you have labs (blood work) drawn today and your tests are completely normal, you will receive your results only by: [place] (if you have MyChart) OR A paper copy in the mail If you have any lab test that is abnormal or we need to change your treatment, we will call you to review the results.   Testing/Procedures: Chest X-ray A chest x-ray takes a picture of the organs and structures inside the chest, including the heart, lungs, and blood vessels. This test can show several things, including, whether the heart is enlarges; whether fluid is building up in the lungs; and whether pacemaker / defibrillator leads are still in place.  Follow-Up: At [facility], you and your health needs are our priority.  As part of our continuing mission to provide you with exceptional heart care, we have created designated Provider Care Teams.  These Care Teams include your primary Cardiologist (physician) and Advanced Practice Providers (APPs -  Physician Assistants and Nurse Practitioners) who all work together to provide you with the care you need, when you need it.  We recommend signing up for the patient portal called "MyChart".  Sign up information is provided on this After Visit Summary.  MyChart is used to connect with patients for Virtual Visits (Telemedicine).  Patients are able to view lab/test results, encounter notes, upcoming appointments, etc.  Non-urgent messages can be sent to your provider as well.   To learn more about what you can do with MyChart, go to [email or web address].    Your next appointment:   6 month(s)  Provider:   Mertie Moores, MD

## 2023-01-03 NOTE — Progress Notes (Unsigned)
Cardiology Office Note:    Date:  01/04/2023   ID:  [name], DOB Apr 19, 1946, MRN [id number]  PCP:  [name], MD   [facility] Providers Cardiologist:  Mertie Moores, MD     Referring MD: [name], MD   Chief Complaint  Patient presents with   Atrial Fibrillation    Oct. 11, 2022   [name] is a 77 y.o. female with a hx of back pain,  CKD, HLD, COPD, paroxysmal A. fib.  We are asked to see her today by Dr. Laurann Montana for further evaluation and management of her paroxysmal atrial fibrillation.  She has not yet been started on anticoagulation. CHADS2VASC is 64 ( female, age, HTN)   No hx of CVA, CHF, DM , no vascular disease    Asymptomatic  Cannot tell when her HR is irreg.  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 Laurann Montana indicate a previous GI bleed and colonoscopy with Dr Collene Mares - she does not recall this .   Jan. 17, 2023 : Seen for her PAF  Is due for her colonoscopy   Instructed her to hold eliquis for 2 days  No symptoms that she could say is Afib   Apr 28, 2022:  [name] is seen for follow up of her PAF. 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 Hx of COPD   Was in the hospital 2 weeks ago .  Found to have severe anemia and rapid afib .  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 5, 2023 [name] is seen for follow up of her atrial fib,  anemia, failure to  thrive, COPD,  CML Wt is 114 lbs.   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 10, 2024  Is gaining her strength.   Eating a little better.  Is perhaps a litte stronger  Wt today is 106 lbs  Takes chemo every 6 weeks .    Past Medical History:  Diagnosis Date   Arthritis    Rheumatoid arthritis   Celiac disease    Chronic kidney disease    stage 3 from MD notes   COPD (chronic obstructive pulmonary disease) ([place])    Dyspnea    with going up stairs   Family history of adverse reaction to anesthesia    father had hard time waking up   Headache    sinus headaches   Hot flashes    Hypertension    Iron deficiency anemia    Pneumonia    per patient "I have walking pneumonia"    Past Surgical History:  Procedure Laterality Date   COLONOSCOPY  ECTOPIC PREGNANCY SURGERY      x 2   IR IMAGING GUIDED PORT INSERTION  01/31/2022   REVERSE SHOULDER ARTHROPLASTY Right 02/02/2017   Procedure: RIGHT REVERSE SHOULDER ARTHROPLASTY;  Surgeon: Netta Cedars, MD;  Location: [place];  Service: Orthopedics;  Laterality: Right;    Current Medications: No outpatient medications have been marked as taking for the 01/03/23 encounter (Office Visit) with Jahayra Mazo, Wonda Cheng, MD.     Allergies:   Digoxin and related, Gluten meal, Penicillins, Alendronate, and Hydroxychloroquine   Social History   Socioeconomic History   Marital status: Married    Spouse name: Not on file   Number of children: Not on file   Years of education: Not on file   Highest education level: Not on file  Occupational History   Not on file  Tobacco Use   Smoking status: Former    Packs/day: 1.00    Years: 30.00    Total pack years: 30.00    Types: Cigarettes    Quit date: 2005    Years since quitting: 19.0   Smokeless tobacco: Never  Vaping Use   Vaping Use: Never used  Substance and Sexual Activity    Alcohol use: Yes    Alcohol/week: 12.0 - 14.0 standard drinks of alcohol    Types: 12 - 14 Glasses of wine per week   Drug use: No   Sexual activity: Not Currently  Other Topics Concern   Not on file  Social History Narrative   Not on file   Social Determinants of Health   Financial Resource Strain: Not on file  Food Insecurity: Not on file  Transportation Needs: Not on file  Physical Activity: Not on file  Stress: Not on file  Social Connections: Not on file     Family History: The patient's family history includes Diabetes in her father; Peripheral Artery Disease in her father.  ROS:   Please see the history of present illness.     All other systems reviewed and are negative.  EKGs/Labs/Other Studies Reviewed:    The following studies were reviewed today: - previous ecgs     Recent Labs: 03/29/2022: TSH 0.731 05/21/2022: B Natriuretic Peptide 148.4 06/21/2022: Magnesium 1.8 12/26/2022: ALT 11; BUN 17; Creatinine 0.91; Hemoglobin 9.9; Platelets 37; Potassium 4.3; Sodium 140  Recent Lipid Panel No results found for: "CHOL", "TRIG", "HDL", "CHOLHDL", "VLDL", "LDLCALC", "LDLDIRECT"   Risk Assessment/Calculations:    CHA2DS2-VASc Score =    } This indicates a  % annual risk of stroke. The patient's score is based upon:           Physical Exam:      Physical Exam: Blood pressure 122/67, pulse 74, height '5\' 2"'$  (1.575 m), weight 106 lb 3.2 oz (48.2 kg), SpO2 98 %.     GEN:  Well nourished, well developed in no acute distress HEENT: Normal NECK: No JVD; No carotid bruits LYMPHATICS: No lymphadenopathy CARDIAC: RRR , no murmurs, rubs, gallops RESPIRATORY:  blunted breath sound 1/2 up her right lung field ( likely pleural effusion )   ABDOMEN: Soft, non-tender, non-distended MUSCULOSKELETAL:  2 + left lower leg edema  SKIN: Warm and dry NEUROLOGIC:  Alert and oriented x 3    EKG:     ASSESSMENT:    1. Bilateral leg edema   2. Pleural effusion         PLAN:      Atrial fib: rhythm is normal today  2.  Leg edema:   2 + leg edema on left leg .  This has been a chronic issue.  She has relatively poor nutrition,  stasis - spends almost all day in the wheelchair.  Does not ambulate.  We discussed increasing her lasix .  She can certainly increase her lasix fif needed.      3.  Generalized failure to thrive:     4.  Right pleural effusion :   clinically she has a moderate sized Right pleural effusion .   She is followed by Dr. Valeta Harms.  Will get a PA and lateral CXR and forward to Dr. Valeta Harms if she has a significant effusions       Medication Adjustments/Labs and Tests Ordered: Current medicines are reviewed at length with the patient today.  Concerns regarding medicines are outlined above.  Orders Placed This Encounter  Procedures   DG Chest 2 View    No orders of the defined types were placed in this encounter.    Patient Instructions  Medication Instructions:  Your physician recommends that you continue on your current medications as directed. Please refer to the Current Medication list given to you today.  *If you need a refill on your cardiac medications before your next appointment, please call your pharmacy*   Lab Work: NONE If you have labs (blood work) drawn today and your tests are completely normal, you will receive your results only by: [place] (if you have MyChart) OR A paper copy in the mail If you have any lab test that is abnormal or we need to change your treatment, we will call you to review the results.   Testing/Procedures: Chest X-ray A chest x-ray takes a picture of the organs and structures inside the chest, including the heart, lungs, and blood vessels. This test can show several things, including, whether the heart is enlarges; whether fluid is building up in the lungs; and whether pacemaker / defibrillator leads are still in place.  Follow-Up: At [facility],  you and your health needs are our priority.  As part of our continuing mission to provide you with exceptional heart care, we have created designated Provider Care Teams.  These Care Teams include your primary Cardiologist (physician) and Advanced Practice Providers (APPs -  Physician Assistants and Nurse Practitioners) who all work together to provide you with the care you need, when you need it.  We recommend signing up for the patient portal called "MyChart".  Sign up information is provided on this After Visit Summary.  MyChart is used to connect with patients for Virtual Visits (Telemedicine).  Patients are able to view lab/test results, encounter notes, upcoming appointments, etc.  Non-urgent messages can be sent to your provider as well.   To learn more about what you can do with MyChart, go to [email or web address].    Your next appointment:   6 month(s)  Provider:   Mertie Moores, MD        Signed, Mertie Moores, MD  01/04/2023 7:28 AM    [place]

## 2023-01-04 ENCOUNTER — Encounter: Payer: Self-pay | Admitting: Cardiovascular Disease

## 2023-01-04 NOTE — Progress Notes (Signed)
[name] ([id number]) [id number] Page 1 of 9 Visit Report for 01/03/2023 Arrival Information Details Patient Name: Date of Service: [name] 01/03/2023 9:30 A M Medical Record Number: [id number] Patient Account Number: [id number] Date of Birth/Sex: Treating RN: December 19, 1946 (77 y.o. F) Primary Care Ambert Virrueta: Judie Bonus INE Other Clinician: Referring Neola Worrall: Treating Tiyana Galla/Extender: Lacie Draft, ELA INE Weeks in Treatment: 15 Visit Information History Since Last Visit All ordered tests and consults were completed: No Patient Arrived: Wheel Chair Added or deleted any medications: No Arrival Time: 09:49 Any new allergies or adverse reactions: No Accompanied By: husband Had a fall or experienced change in No Transfer Assistance: None activities of daily living that may affect Patient Identification Verified: Yes risk of falls: Secondary Verification Process Completed: Yes Signs or symptoms of abuse/neglect since last visito No Patient Requires Transmission-Based Precautions: No Hospitalized since last visit: No Patient Has Alerts: No Implantable device outside of the clinic excluding No cellular tissue based products placed in the center since last visit: Pain Present Now: No Electronic Signature(s) Signed: 01/03/2023 10:36:45 AM By: Worthy Rancher Entered By: Worthy Rancher on 01/03/2023 09:49:36 -------------------------------------------------------------------------------- Encounter Discharge Information Details Patient Name: Date of Service: Sherry Masson [name] 01/03/2023 9:30 A M Medical Record Number: [id number] Patient Account Number: [id number] Date of Birth/Sex: Treating RN: 06-25-1946 (77 y.o. [name] Primary Care Taela Charbonneau: Marcy Panning Other Clinician: Referring Josey Forcier: Treating Janika Jedlicka/Extender: Lacie Draft, ELA INE Weeks in Treatment: 15 Encounter Discharge Information Items  Post Procedure Vitals Discharge Condition: Stable Temperature (F): 97.7 Ambulatory Status: Wheelchair Pulse (bpm): 71 Discharge Destination: Home Respiratory Rate (breaths/min): 20 Transportation: Private Auto Blood Pressure (mmHg): 113/74 Accompanied By: self Schedule Follow-up Appointment: Yes Clinical Summary of Care: Patient Declined Electronic Signature(s) Signed: 01/03/2023 5:41:13 PM By: Dellie Catholic RN Entered By: Dellie Catholic on 01/03/2023 17:40:51 [place], [name] ([id number]) [id number] Page 2 of 9 -------------------------------------------------------------------------------- Lower Extremity Assessment Details Patient Name: Date of Service: [name] 01/03/2023 9:30 A M Medical Record Number: [id number] Patient Account Number: [id number] Date of Birth/Sex: Treating RN: 08/29/1946 (77 y.o. [name] Primary Care Sharunda Salmon: Marcy Panning Other Clinician: Referring Vella Colquitt: Treating Emelda Kohlbeck/Extender: Lacie Draft, ELA INE Weeks in Treatment: 15 Edema Assessment Assessed: [Left: No] [Right: No] Edema: [Left: N] [Right: o] Calf Left: Right: Point of Measurement: From Medial Instep 29 cm Ankle Left: Right: Point of Measurement: From Medial Instep 23 cm Vascular Assessment Pulses: Dorsalis Pedis Palpable: [Right:Yes] Electronic Signature(s) Signed: 01/03/2023 5:41:13 PM By: Dellie Catholic RN Entered By: Dellie Catholic on 01/03/2023 10:02:36 -------------------------------------------------------------------------------- Multi Wound Chart Details Patient Name: Date of Service: Sherry Masson [name] 01/03/2023 9:30 A M Medical Record Number: [id number] Patient Account Number: [id number] Date of Birth/Sex: Treating RN: [birth year] (77 y.o. F) Primary Care Mona Ayars: Judie Bonus INE Other Clinician: Referring Nasha Diss: Treating Dima Mini/Extender: Lacie Draft, ELA INE Weeks in  Treatment: 15 Vital Signs Height(in): 64 Pulse(bpm): 71 Weight(lbs): 111 Blood Pressure(mmHg): 113/74 Body Mass Index(BMI): 19.1 Temperature(F): 97.7 Respiratory Rate(breaths/min): 20 [1:Photos:] [N/A:N/A [id number] Page 3 of 9] Right, Dorsal Foot Right, Medial Lower Leg N/A Wound Location: Blister Laceration N/A Wounding Event: Lymphedema Trauma, Other N/A Primary Etiology: Asthma, Chronic Obstructive Asthma, Chronic Obstructive N/A Comorbid History: Pulmonary Disease (COPD), Pulmonary Disease (COPD), Congestive Heart Failure, Congestive Heart Failure, Hypertension, Rheumatoid Arthritis, Hypertension, Rheumatoid Arthritis, Neuropathy, Received Chemotherapy Neuropathy, Received Chemotherapy 08/23/2022 12/18/2022 N/A Date Acquired: 15 0 N/A Weeks of Treatment: Open Open N/A Wound  Status: No No N/A Wound Recurrence: 0.4x0.3x0.1 5.5x1.3x0.1 N/A Measurements L x W x D (cm) 0.094 5.616 N/A A (cm) : rea 0.009 0.562 N/A Volume (cm) : 90.90% 48.00% N/A % Reduction in A rea: 98.60% 48.00% N/A % Reduction in Volume: Full Thickness Without Exposed Full Thickness Without Exposed N/A Classification: Support Structures Support Structures Medium Medium N/A Exudate A mount: Serosanguineous Serosanguineous N/A Exudate Type: red, brown red, brown N/A Exudate Color: Distinct, outline attached N/A N/A Wound Margin: Large (67-100%) Small (1-33%) N/A Granulation A mount: Pink Red N/A Granulation Quality: Small (1-33%) Large (67-100%) N/A Necrotic A mount: Adherent Slough Eschar, Adherent Slough N/A Necrotic Tissue: Fat Layer (Subcutaneous Tissue): Yes Fat Layer (Subcutaneous Tissue): Yes N/A Exposed Structures: Fascia: No Fascia: No Tendon: No Tendon: No Muscle: No Muscle: No Joint: No Joint: No Bone: No Bone: No None N/A N/A Epithelialization: Debridement - Excisional Debridement - Excisional N/A Debridement: Pre-procedure  Verification/Time Out 10:05 10:05 N/A Taken: Lidocaine 4% Topical Solution Lidocaine 4% Topical Solution N/A Pain Control: Subcutaneous, Slough Other, Subcutaneous, Slough N/A Tissue Debrided: Skin/Subcutaneous Tissue Skin/Subcutaneous Tissue N/A Level: 0.12 7.15 N/A Debridement A (sq cm): rea Curette Curette N/A Instrument: Minimum Minimum N/A Bleeding: Pressure Pressure N/A Hemostasis A chieved: 0 0 N/A Procedural Pain: 0 0 N/A Post Procedural Pain: Procedure was tolerated well Procedure was tolerated well N/A Debridement Treatment Response: 0.4x0.3x0.1 5.5x1.3x0.1 N/A Post Debridement Measurements L x W x D (cm) 0.009 0.562 N/A Post Debridement Volume: (cm) No Abnormalities Noted No Abnormalities Noted N/A Periwound Skin Texture: Dry/Scaly: Yes No Abnormalities Noted N/A Periwound Skin Moisture: Erythema: No No Abnormalities Noted N/A Periwound Skin Color: No Abnormality No Abnormality N/A Temperature: Debridement Debridement N/A Procedures Performed: Treatment Notes Electronic Signature(s) Signed: 01/03/2023 10:30:14 AM By: Fredirick Maudlin MD FACS Entered By: Fredirick Maudlin on 01/03/2023 10:30:14 -------------------------------------------------------------------------------- Multi-Disciplinary Care Plan Details Patient Name: Date of Service: Sherry Masson [name] 01/03/2023 9:30 A M Medical Record Number: [id number] Patient Account Number: [id number] Date of Birth/Sex: Treating RN: 11/02/1946 (77 y.o. [name] Primary Care Kurstin Dimarzo: Marcy Panning Other Clinician: Referring Kelechi Orgeron: Treating Bralyn Folkert/Extender: Lacie Draft, ELA INE Weeks in Treatment: [address], [place] ([id number]) [phone].pdf Page 4 of 9 Active Inactive Peripheral Neuropathy Nursing Diagnoses: Potential alteration in peripheral tissue perfusion (select prior to confirmation of diagnosis) Goals: Patient/caregiver will verbalize  understanding of disease process and disease management Date Initiated: 09/20/2022 Target Resolution Date: 03/25/2023 Goal Status: Active Interventions: Provide education on Management of Neuropathy and Related Ulcers Treatment Activities: Patient referred for customized footwear/offloading : 09/20/2022 Notes: Wound/Skin Impairment Nursing Diagnoses: Knowledge deficit related to ulceration/compromised skin integrity Goals: Ulcer/skin breakdown will have a volume reduction of 30% by week 4 Date Initiated: 09/20/2022 Date Inactivated: 11/29/2022 Target Resolution Date: 11/30/2022 Goal Status: Met Ulcer/skin breakdown will have a volume reduction of 50% by week 8 Date Initiated: 11/29/2022 Target Resolution Date: 03/25/2023 Goal Status: Active Interventions: Assess ulceration(s) every visit Treatment Activities: Skin care regimen initiated : 09/20/2022 Topical wound management initiated : 09/20/2022 Notes: Electronic Signature(s) Signed: 01/03/2023 5:41:13 PM By: Dellie Catholic RN Entered By: Dellie Catholic on 01/03/2023 17:39:23 -------------------------------------------------------------------------------- Pain Assessment Details Patient Name: Date of Service: Sherry Masson [name] 01/03/2023 9:30 A M Medical Record Number: [id number] Patient Account Number: [id number] Date of Birth/Sex: Treating RN: Aug 24, 1946 (77 y.o. F) Primary Care Maor Meckel: Marcy Panning Other Clinician: Referring Taeja Debellis: Treating Kydan Shanholtzer/Extender: Lacie Draft, ELA INE Weeks in Treatment: 15 Active Problems Location of Pain Severity and  Description of Pain Patient Has Paino No [address] [place], [place] ([id number]) [id number] Page 5 of 9 Pain Management and Medication Current Pain Management: Electronic Signature(s) Signed: 01/03/2023 10:36:45 AM By: Worthy Rancher Entered By: Worthy Rancher on 01/03/2023  09:50:13 -------------------------------------------------------------------------------- Patient/Caregiver Education Details Patient Name: Date of Service: Alroy Dust 1/10/2024andnbsp9:30 A M Medical Record Number: [id number] Patient Account Number: [id number] Date of Birth/Gender: Treating RN: December 08, 1946 (77 y.o. [name] Primary Care Physician: Judie Bonus INE Other Clinician: Referring Physician: Treating Physician/Extender: Lacie Draft, ELA INE Weeks in Treatment: 15 Education Assessment Education Provided To: Patient Education Topics Provided Wound/Skin Impairment: Methods: Explain/Verbal Responses: Return demonstration correctly Electronic Signature(s) Signed: 01/03/2023 5:41:13 PM By: Dellie Catholic RN Entered By: Dellie Catholic on 01/03/2023 17:39:39 -------------------------------------------------------------------------------- Wound Assessment Details Patient Name: Date of Service: Sherry Masson [name] 01/03/2023 9:30 A M Medical Record Number: [id number] Patient Account Number: [id number] Date of Birth/Sex: Treating RN: 1946/08/04 (77 y.o. F) Primary Care Zulma Court: Marcy Panning Other Clinician: Referring Coner Gibbard: Treating Kiowa Hollar/Extender: Lacie Draft, ELA INE [place], [place] ([id number]) [id number] Page 6 of 9 Weeks in Treatment: 15 Wound Status Wound Number: 1 Primary Lymphedema Etiology: Wound Location: Right, Dorsal Foot Wound Open Wounding Event: Blister Status: Date Acquired: 08/23/2022 Comorbid Asthma, Chronic Obstructive Pulmonary Disease (COPD), Weeks Of Treatment: 15 History: Congestive Heart Failure, Hypertension, Rheumatoid Arthritis, Clustered Wound: No Neuropathy, Received Chemotherapy Photos Wound Measurements Length: (cm) 0.4 Width: (cm) 0.3 Depth: (cm) 0.1 Area: (cm) 0.094 Volume: (cm) 0.009 % Reduction in Area: 90.9% % Reduction in Volume:  98.6% Epithelialization: None Tunneling: No Undermining: No Wound Description Classification: Full Thickness Without Exposed Support Structures Wound Margin: Distinct, outline attached Exudate Amount: Medium Exudate Type: Serosanguineous Exudate Color: red, brown Foul Odor After Cleansing: No Slough/Fibrino Yes Wound Bed Granulation Amount: Large (67-100%) Exposed Structure Granulation Quality: Pink Fascia Exposed: No Necrotic Amount: Small (1-33%) Fat Layer (Subcutaneous Tissue) Exposed: Yes Necrotic Quality: Adherent Slough Tendon Exposed: No Muscle Exposed: No Joint Exposed: No Bone Exposed: No Periwound Skin Texture Texture Color No Abnormalities Noted: Yes No Abnormalities Noted: No Erythema: No Moisture No Abnormalities Noted: No Temperature / Pain Dry / Scaly: Yes Temperature: No Abnormality Treatment Notes Wound #1 (Foot) Wound Laterality: Dorsal, Right Cleanser Soap and Water Discharge Instruction: May shower and wash wound with dial antibacterial soap and water prior to dressing change. Wound Cleanser Discharge Instruction: Cleanse the wound with wound cleanser prior to applying a clean dressing using gauze sponges, not tissue or cotton balls. Peri-Wound Care Topical Primary Dressing Sorbalgon AG Dressing 2x2 (in/in) Discharge Instruction: Apply to wound bed as instructed Secondary Dressing ANGELINA, VENARD ([id number]) [id number] Page 7 of 9 Woven Gauze Sponge, Non-Sterile 4x4 in Discharge Instruction: Apply over primary dressing as directed. Zetuvit Plus Silicone Border Dressing 4x4 (in/in) Discharge Instruction: Apply silicone border over primary dressing as directed. Secured With Compression Wrap Compression Stockings Environmental education officer) Signed: 01/03/2023 5:41:13 PM By: Dellie Catholic RN Entered By: Dellie Catholic on 01/03/2023  10:01:05 -------------------------------------------------------------------------------- Wound Assessment Details Patient Name: Date of Service: LAKSHMI, SUNDEEN [name] 01/03/2023 9:30 A M Medical Record Number: [id number] Patient Account Number: [id number] Date of Birth/Sex: Treating RN: [birth year] (77 y.o. F) Primary Care Celicia Minahan: Marcy Panning Other Clinician: Referring Glennie Rodda: Treating Lora Chavers/Extender: Lacie Draft, ELA INE Weeks in Treatment: 15 Wound Status Wound Number: 3 Primary Trauma, Other Etiology: Wound Location: Right, Medial Lower Leg Wound Open Wounding Event: Laceration Status: Date Acquired: 12/18/2022 Comorbid  Asthma, Chronic Obstructive Pulmonary Disease (COPD), Weeks Of Treatment: 0 History: Congestive Heart Failure, Hypertension, Rheumatoid Arthritis, Clustered Wound: No Neuropathy, Received Chemotherapy Photos Wound Measurements Length: (cm) 5.5 Width: (cm) 1.3 Depth: (cm) 0.1 Area: (cm) 5.616 Volume: (cm) 0.562 % Reduction in Area: 48% % Reduction in Volume: 48% Tunneling: No Undermining: No Wound Description Classification: Full Thickness Without Exposed Support Structures Exudate Amount: Medium Exudate Type: Serosanguineous Exudate Color: red, brown Foul Odor After Cleansing: No Slough/Fibrino Yes Wound Bed Granulation Amount: Small (1-33%) Exposed Structure Granulation Quality: Red Fascia Exposed: No Necrotic Amount: Large (67-100%) Fat Layer (Subcutaneous Tissue) Exposed: Yes Necrotic Quality: Eschar, Adherent Slough Tendon Exposed: No [place], Sharlena H ([id number]) [id number] Page 8 of 9 Muscle Exposed: No Joint Exposed: No Bone Exposed: No Periwound Skin Texture Texture Color No Abnormalities Noted: Yes No Abnormalities Noted: Yes Moisture Temperature / Pain No Abnormalities Noted: Yes Temperature: No Abnormality Treatment Notes Wound #3 (Lower Leg) Wound Laterality: Right,  Medial Cleanser Soap and Water Discharge Instruction: May shower and wash wound with dial antibacterial soap and water prior to dressing change. Wound Cleanser Discharge Instruction: Cleanse the wound with wound cleanser prior to applying a clean dressing using gauze sponges, not tissue or cotton balls. Peri-Wound Care Topical Primary Dressing IODOFLEX 0.9% Cadexomer Iodine Pad 4x6 cm Discharge Instruction: Apply to wound bed as instructed Secondary Dressing ABD Pad, 8x10 Discharge Instruction: Apply over primary dressing as directed. Secured With Elastic Bandage 4 inch (ACE bandage) Discharge Instruction: Secure with ACE bandage as directed. Kerlix Roll Sterile, 4.5x3.1 (in/yd) Discharge Instruction: Secure with Kerlix as directed. Compression Wrap Compression Stockings Add-Ons Electronic Signature(s) Signed: 01/03/2023 5:41:13 PM By: Dellie Catholic RN Entered By: Dellie Catholic on 01/03/2023 10:01:53 -------------------------------------------------------------------------------- Vitals Details Patient Name: Date of Service: Sherry Masson [name] 01/03/2023 9:30 A M Medical Record Number: [id number] Patient Account Number: [id number] Date of Birth/Sex: Treating RN: June 29, 1946 (77 y.o. F) Primary Care Omarie Parcell: Judie Bonus INE Other Clinician: Referring Ellie Spickler: Treating Lametria Klunk/Extender: Lacie Draft, ELA INE Weeks in Treatment: 15 Vital Signs Time Taken: 09:49 Temperature (F): 97.7 Height (in): 64 Pulse (bpm): 71 Weight (lbs): 111 Respiratory Rate (breaths/min): 20 Body Mass Index (BMI): 19.1 Blood Pressure (mmHg): 113/74 Reference Range: 80 - 120 mg / dl Electronic Signature(s) [place], Crystal H ([id number]) [id number] Page 9 of 9 Signed: 01/03/2023 10:36:45 AM By: Worthy Rancher Entered By: Worthy Rancher on 01/03/2023 09:50:06

## 2023-01-04 NOTE — Progress Notes (Signed)
KOURTNEY, TERRIQUEZ ([id number]) [id number] Page 1 of 11 Visit Report for 01/03/2023 Chief Complaint Document Details Patient Name: Date of Service: [name], [name] 01/03/2023 9:30 A M Medical Record Number: [id number] Patient Account Number: [id number] Date of Birth/Sex: Treating RN: [birth year] (77 y.o. F) Primary Care Provider: Marcy Panning Other Clinician: Referring Provider: Treating Provider/Extender: Lacie Draft, ELA INE Weeks in Treatment: 15 Information Obtained from: Patient Chief Complaint Patient seen for complaints of Non-Healing Wounds. Electronic Signature(s) Signed: 01/03/2023 10:30:21 AM By: Fredirick Maudlin MD FACS Entered By: Fredirick Maudlin on 01/03/2023 10:30:20 -------------------------------------------------------------------------------- Debridement Details Patient Name: Date of Service: Sherry Masson RO N [name]. 01/03/2023 9:30 A M Medical Record Number: [id number] Patient Account Number: [id number] Date of Birth/Sex: Treating RN: December 30, 1945 (77 y.o. [name] Primary Care Provider: Judie Bonus INE Other Clinician: Referring Provider: Treating Provider/Extender: Lacie Draft, ELA INE Weeks in Treatment: 15 Debridement Performed for Assessment: Wound #1 Right,Dorsal Foot Performed By: Physician Fredirick Maudlin, MD Debridement Type: Debridement Level of Consciousness (Pre-procedure): Awake and Alert Pre-procedure Verification/Time Out Yes - 10:05 Taken: Start Time: 10:05 Pain Control: Lidocaine 4% T opical Solution T Area Debrided (L x W): otal 0.4 (cm) x 0.3 (cm) = 0.12 (cm) Tissue and other material debrided: Non-Viable, Slough, Subcutaneous, Slough Level: Skin/Subcutaneous Tissue Debridement Description: Excisional Instrument: Curette Bleeding: Minimum Hemostasis Achieved: Pressure End Time: 10:06 Procedural Pain: 0 Post Procedural Pain: 0 Response to Treatment: Procedure was  tolerated well Level of Consciousness (Post- Awake and Alert procedure): Post Debridement Measurements of Total Wound Length: (cm) 0.4 Width: (cm) 0.3 Depth: (cm) 0.1 Volume: (cm) 0.009 Character of Wound/Ulcer Post Debridement: Improved Post Procedure Diagnosis [name], [name] ([id number]) [id number] Page 2 of 11 Same as Pre-procedure Notes Scribed for Dr. [name] by J.Scotton Electronic Signature(s) Signed: 01/03/2023 11:49:27 AM By: Fredirick Maudlin MD FACS Signed: 01/03/2023 5:41:13 PM By: Dellie Catholic RN Entered By: Dellie Catholic on 01/03/2023 10:20:44 -------------------------------------------------------------------------------- Debridement Details Patient Name: Date of Service: Sherry Masson RO N [name]. 01/03/2023 9:30 A M Medical Record Number: [id number] Patient Account Number: [id number] Date of Birth/Sex: Treating RN: 1946/02/11 (77 y.o. [name] Primary Care Provider: Judie Bonus INE Other Clinician: Referring Provider: Treating Provider/Extender: Lacie Draft, ELA INE Weeks in Treatment: 15 Debridement Performed for Assessment: Wound #3 Right,Medial Lower Leg Performed By: Physician Fredirick Maudlin, MD Debridement Type: Debridement Level of Consciousness (Pre-procedure): Awake and Alert Pre-procedure Verification/Time Out Yes - 10:05 Taken: Start Time: 10:05 Pain Control: Lidocaine 4% T opical Solution T Area Debrided (L x W): otal 5.5 (cm) x 1.3 (cm) = 7.15 (cm) Tissue and other material debrided: Non-Viable, Slough, Subcutaneous, Slough, Other: skin Level: Skin/Subcutaneous Tissue Debridement Description: Excisional Instrument: Curette Bleeding: Minimum Hemostasis Achieved: Pressure End Time: 10:06 Procedural Pain: 0 Post Procedural Pain: 0 Response to Treatment: Procedure was tolerated well Level of Consciousness (Post- Awake and Alert procedure): Post Debridement Measurements of Total  Wound Length: (cm) 5.5 Width: (cm) 1.3 Depth: (cm) 0.1 Volume: (cm) 0.562 Character of Wound/Ulcer Post Debridement: Improved Post Procedure Diagnosis Same as Pre-procedure Notes Scribed for Dr. [name] by J.Scotton Electronic Signature(s) Signed: 01/03/2023 11:49:27 AM By: Fredirick Maudlin MD FACS Signed: 01/03/2023 5:41:13 PM By: Dellie Catholic RN Entered By: Dellie Catholic on 01/03/2023 10:22:14 [name], [name] ([id number]) [id number] Page 3 of 11 -------------------------------------------------------------------------------- HPI Details Patient Name: Date of Service: [name], [name] 01/03/2023 9:30 A M Medical Record Number: [id number] Patient Account Number: [id number] Date of Birth/Sex: Treating RN: May 04, 1946 (  77 y.o. F) Primary Care Provider: Marcy Panning Other Clinician: Referring Provider: Treating Provider/Extender: Lacie Draft, ELA INE Weeks in Treatment: 15 History of Present Illness HPI Description: ADMISSION 09/20/2022 This is a 77 year old woman with a past medical history significant for congestive heart failure, atrial fibrillation, rheumatoid arthritis on long-term steroid treatment, CMML currently receiving chemotherapy and moderate malnutrition. She presents to clinic today with a wound on the dorsum of her right foot. She says it started out as a bruise and then developed a bump which subsequently broke open. She says she was sent to our clinic by her oncologist. She is not diabetic and does not smoke. ABI in clinic today was 1.55. She has had formal segmental arterial Dopplers done, a little over a year ago, which did not show any evidence of occlusive vascular disease. On the dorsal aspect of the right foot, there is a wound with a lot of old hematoma, slough, eschar, and and nonviable subcutaneous tissue. T the best of my o ability to discern, it does appear to involve the muscle layer, but there is no  evidence of necrosis. The wound is pouring serous fluid; the patient has 3+ pitting edema to the bilateral lower extremities. 09/29/2022: The patient came to clinic today with nothing but a T overlying her wound. She is accompanied by her husband today. She has not elfa communicated with her cardiologist regarding her fluid overload. She still has 3+ pitting edema bilaterally. The wound is still pouring serous fluid, although not quite as effusively as at her initial visit. There is still quite a bit of nonviable tissue and hematoma present. 10/13; patient's wound on the right lateral lower leg his eschared over and may well be on its way to healing. The real problem here is on the right dorsal foot just proximal to her toes. The patient is still puzzled by the cause of this. Looking at a picture on the patient's smart phone there is a suggestion that this might have been a hematoma which seems to fit what Dr. [name] had felt on her initial evaluation. They have been using iodoform packing kerlix and Coban. 10/20; right lower leg is healed. We are approved for snap VAC on the right foot. We will try to arrange home health but until then she is going to need a nurse visit on Tuesday 10/20/2022: It has been 2 weeks since I have seen the wound. It has improved significantly since then. She is currently in a snap VAC and I think this has made quite a difference. The overall wound measurements are smaller. There is no massive drainage, as there had been previously. There is some good granulation tissue forming underneath a bit of slough. 10/30/2022: The wound continues to improve with use of the snap VAC. The swelling in her foot is better. The undermining continues to close in. There is a layer of slough overlying good granulation tissue. 11/06/2022: The wound has contracted further. The undermining is still present but the wound surface is better and her edema control is good. 11/13/2022: The wound is  smaller again and the undermining has decreased as well. There is slough accumulation over a nice bed of granulation tissue. 11/20/2022: We did not have any snap VAC devices in clinic last week so the wound was just packed. It did well and is smaller with less undermining today. There is still slough present on the surface. 11/29/2022: The wound has contracted considerably and the undermining has resolved. There is still  some slough on the wound surface. 12/13; slough on the surface of the wound required debridement however afterwards the granulation looks healthy the wound has been contracting. She has been using a snap VAC with collagen 12/13/2022: The wound was too small and superficial last week and the snap VAC was discontinued. They have been using Hydrofera Blue due to the hypertrophic granulation tissue. The wound has a little bit of slough on the surface. 12/28/2022: The wound on her dorsal foot continues to contract and is quite superficial at this point. There is a little bit of slough on the surface. Unfortunately, she struck her right medial leg on a hard surface on Christmas, resulting in a laceration. It was sutured in the emergency room. She just came from having the sutures removed. Overall, I do not think the skin is likely to survive, but for now there is no grossly open ulcer at this site. 01/03/2023: Her edema is fairly significant today and not at all well-controlled. The dorsal foot wound appears deeper, but this may be an illusion secondary to the swelling. There is a fair amount of slough accumulation in the wound. The medial leg wound, as expected, shows signs of skin necrosis and partial dehiscence. There is hematoma and necrotic fat visible. Electronic Signature(s) Signed: 01/03/2023 10:31:30 AM By: Fredirick Maudlin MD FACS Entered By: Fredirick Maudlin on 01/03/2023 10:31:30 -------------------------------------------------------------------------------- Physical Exam  Details Patient Name: Date of Service: Sherry Masson RO N [name]. 01/03/2023 9:30 A M Medical Record Number: [id number] Patient Account Number: [id number] Date of Birth/Sex: Treating RN: 1946/04/19 (77 y.o. F) Primary Care Provider: Marcy Panning Other Clinician: Referring Provider: Treating Provider/Extender: Lacie Draft, ELA INE [name], [name] ([id number]) [id number] Page 4 of 11 Weeks in Treatment: 15 Constitutional . . . . no acute distress. Respiratory Normal work of breathing on room air. Cardiovascular 3+ pitting edema in the right lower leg. Notes 01/03/2023: Her edema is fairly significant today and not at all well-controlled. The dorsal foot wound appears deeper, but this may be an illusion secondary to the swelling. There is a fair amount of slough accumulation in the wound. The medial leg wound, as expected, shows signs of skin necrosis and partial dehiscence. There is hematoma and necrotic fat visible. Electronic Signature(s) Signed: 01/03/2023 10:33:15 AM By: Fredirick Maudlin MD FACS Entered By: Fredirick Maudlin on 01/03/2023 10:33:15 -------------------------------------------------------------------------------- Physician Orders Details Patient Name: Date of Service: Sherry Masson RO N [name]. 01/03/2023 9:30 A M Medical Record Number: [id number] Patient Account Number: [id number] Date of Birth/Sex: Treating RN: Sep 28, 1946 (77 y.o. 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 - Dr. [name] Room 1 Return A Bathing/ Shower/ Hygiene Other Bathing/Shower/Hygiene Orders/Instructions: - Please do not get LEG WRAPS on Right leg wet. Sponge bathe or use a cast protector on right leg if showering/bathing. Edema Control - Lymphedema / SCD / Other Left Lower Extremity Avoid standing for long periods of time. Patient to wear own  compression stockings every day. Compression stocking or Garment 20-30 mm/Hg pressure to: - left leg Wound Treatment Wound #1 - Foot Wound Laterality: Dorsal, Right Cleanser: Soap and Water Every Other Day/15 Days Discharge Instructions: May shower and wash wound with dial antibacterial soap and water prior to dressing change. Cleanser: Wound Cleanser (Generic) Every Other Day/15 Days Discharge Instructions: Cleanse the wound with wound cleanser prior to applying a clean dressing using gauze sponges, not tissue or cotton balls. Prim Dressing: Sorbalgon AG Dressing 2x2 (in/in) Every Other Day/15 Days ary Discharge Instructions: Apply to wound bed as instructed [name], [name] ([id number]) [id number] Page 5 of 11 Secondary Dressing: Woven Gauze Sponge, Non-Sterile 4x4 in (Generic) Every Other Day/15 Days Discharge Instructions: Apply over primary dressing as directed. Secondary Dressing: Zetuvit Plus Silicone Border Dressing 4x4 (in/in) (Generic) Every Other Day/15 Days Discharge Instructions: Apply silicone border over primary dressing as directed. Wound #3 - Lower Leg Wound Laterality: Right, Medial Cleanser: Soap and Water 1 x Per Week/30 Days Discharge Instructions: May shower and wash wound with dial antibacterial soap and water prior to dressing change. Cleanser:  Wound Cleanser 1 x Per Week/30 Days Discharge Instructions: Cleanse the wound with wound cleanser prior to applying a clean dressing using gauze sponges, not tissue or cotton balls. Prim Dressing: IODOFLEX 0.9% Cadexomer Iodine Pad 4x6 cm 1 x Per Week/30 Days ary Discharge Instructions: Apply to wound bed as instructed Secondary Dressing: ABD Pad, 8x10 1 x Per Week/30 Days Discharge Instructions: Apply over primary dressing as directed. Secured With: Elastic Bandage 4 inch (ACE bandage) 1 x Per Week/30 Days Discharge Instructions: Secure with ACE bandage as directed. Secured With: [employer], 4.5x3.1 (in/yd) 1 x Per Week/30 Days Discharge Instructions: Secure with Kerlix as directed. Electronic Signature(s) Signed: 01/03/2023 11:49:27 AM By: Fredirick Maudlin MD FACS Entered By: Fredirick Maudlin on 01/03/2023 10:33:31 -------------------------------------------------------------------------------- Problem List Details Patient Name: Date of Service: Sherry Masson RO N [name]. 01/03/2023 9:30 A M Medical Record Number: [id number] Patient Account Number: [id number] Date of Birth/Sex: Treating RN: 07/29/1946 (77 y.o. 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 09/20/2022 No Yes without evidence of necrosis L97.812 Non-pressure chronic ulcer of other part of right lower leg with fat layer 12/28/2022 No Yes exposed E70.35 Chronic systolic (congestive) heart failure 09/20/2022 No Yes M06.9 Rheumatoid arthritis, unspecified 09/20/2022 No Yes Z79.52 Long term (current) use of systemic steroids 09/20/2022 No Yes [name], [name] ([id number]) [id number] Page 6 of 11 I70.90 Unspecified  atherosclerosis 09/20/2022 No Yes 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 01/03/2023 9:30 A M Medical Record Number: [id number] Patient Account Number: [id number] Date of Birth/Sex: Treating RN: [birth year] (77 y.o. F) Primary Care Provider: Marcy Panning Other Clinician: Referring Provider: Treating Provider/Extender: Lacie Draft, ELA INE Weeks in Treatment: 15 Subjective Chief Complaint Information obtained from Patient Patient seen for complaints of Non-Healing Wounds. History of Present Illness (HPI) ADMISSION 09/20/2022  This is a 77 year old woman with a past medical history significant for congestive heart failure, atrial fibrillation, rheumatoid arthritis on long-term steroid treatment, CMML currently receiving chemotherapy and moderate malnutrition. She presents to clinic today with a wound on the dorsum of her right foot. She says it started out as a bruise and then developed a bump which subsequently broke open. She says she was sent to our clinic by her oncologist. She is not diabetic and does not smoke. ABI in clinic today was 1.55. She has had formal segmental arterial Dopplers done, a little over a year ago, which did not show any evidence of occlusive vascular disease. On the dorsal aspect of the right foot, there is a wound with a lot of old hematoma, slough, eschar, and and nonviable subcutaneous tissue. T the best of my o ability  to discern, it does appear to involve the muscle layer, but there is no evidence of necrosis. The wound is pouring serous fluid; the patient has 3+ pitting edema to the bilateral lower extremities. 09/29/2022: The patient came to clinic today with nothing but a T overlying her wound. She is accompanied by her husband today. She has not elfa communicated with her cardiologist regarding her fluid overload. She still has 3+ pitting edema bilaterally. The wound is still pouring serous fluid, although not quite as effusively as at her initial visit. There is still quite a bit of nonviable tissue and hematoma present. 10/13; patient's wound on the right lateral lower leg his eschared over and may well be on its way to healing. The real problem here is on the right dorsal foot just proximal to her toes. The patient is still puzzled by the cause of this. Looking at a picture on the patient's smart phone there is a suggestion that this might have been a hematoma which seems to fit what Dr. [name] had felt on her initial evaluation. They have been using iodoform packing kerlix and Coban. 10/20; right lower leg is healed. We are approved for snap VAC on the right foot. We will try to arrange home health but until then she is going to need a nurse visit on Tuesday 10/20/2022: It has been 2 weeks since I have seen the wound. It has improved significantly since then. She is currently in a snap VAC and I think this has made quite a difference. The overall wound measurements are smaller. There is no massive drainage, as there had been previously. There is some good granulation tissue forming underneath a bit of slough. 10/30/2022: The wound continues to improve with use of the snap VAC. The swelling in her foot is better. The undermining continues to close in. There is a layer of slough overlying good granulation tissue. 11/06/2022: The wound has contracted further. The undermining is still present but the wound  surface is better and her edema control is good. [name], [name] ([id number]) [id number] Page 7 of 11 11/13/2022: The wound is smaller again and the undermining has decreased as well. There is slough accumulation over a nice bed of granulation tissue. 11/20/2022: We did not have any snap VAC devices in clinic last week so the wound was just packed. It did well and is smaller with less undermining today. There is still slough present on the surface. 11/29/2022: The wound has contracted considerably and the undermining has resolved. There is still some slough on the wound surface. 12/13; slough on the surface of the wound required debridement however afterwards the granulation looks healthy the  wound has been contracting. She has been using a snap VAC with collagen 12/13/2022: The wound was too small and superficial last week and the snap VAC was discontinued. They have been using Hydrofera Blue due to the hypertrophic granulation tissue. The wound has a little bit of slough on the surface. 12/28/2022: The wound on her dorsal foot continues to contract and is quite superficial at this point. There is a little bit of slough on the surface. Unfortunately, she struck her right medial leg on a hard surface on Christmas, resulting in a laceration. It was sutured in the emergency room. She just came from having the sutures removed. Overall, I do not think the skin is likely to survive, but for now there is no grossly open ulcer at this site. 01/03/2023: Her edema is fairly significant today and not at all well-controlled. The dorsal foot wound appears deeper, but this may be an illusion secondary to the swelling. There is a fair amount of slough accumulation in the wound. The medial leg wound, as expected, shows signs of skin necrosis and partial dehiscence. There is hematoma and necrotic fat visible. Patient History Information obtained from Patient. Family History Unknown  History, Diabetes - Father, Heart Disease - Father, No family history of Cancer. Social History Former smoker - quit 2003, Marital Status - Married, Alcohol Use - Daily, Drug Use - Prior History, Caffeine Use - Daily. Medical History Eyes Denies history of Cataracts, Glaucoma, Optic Neuritis Ear/Nose/Mouth/Throat Denies history of Chronic sinus problems/congestion, Middle ear problems Respiratory Patient has history of Asthma, Chronic Obstructive Pulmonary Disease (COPD) Cardiovascular Patient has history of Congestive Heart Failure, Hypertension Denies history of Coronary Artery Disease, Deep Vein Thrombosis, Hypotension, Myocardial Infarction Endocrine Denies history of Type I Diabetes, Type II Diabetes Immunological Denies history of Lupus Erythematosus, Raynaudoos, Scleroderma Integumentary (Skin) Denies history of History of Burn Musculoskeletal Patient has history of Rheumatoid Arthritis Denies history of Gout Neurologic Patient has history of Neuropathy Oncologic Patient has history of Received Chemotherapy - currently receiving Hospitalization/Surgery History - reverse shoulder arthroplasty Rt- 2018. Medical A Surgical History Notes nd Cardiovascular AFIB Genitourinary Stage 3 CKD Objective Constitutional no acute distress. Vitals Time Taken: 9:49 AM, Height: 64 in, Weight: 111 lbs, BMI: 19.1, Temperature: 97.7 F, Pulse: 71 bpm, Respiratory Rate: 20 breaths/min, Blood Pressure: 113/74 mmHg. Respiratory Normal work of breathing on room air. Cardiovascular 3+ pitting edema in the right lower leg. General Notes: 01/03/2023: Her edema is fairly significant today and not at all well-controlled. The dorsal foot wound appears deeper, but this may be an illusion [name], [name] ([id number]) [id number] Page 8 of 11 secondary to the swelling. There is a fair amount of slough accumulation in the wound. The medial leg wound, as expected, shows  signs of skin necrosis and partial dehiscence. There is hematoma and necrotic fat visible. Integumentary (Hair, Skin) Wound #1 status is Open. Original cause of wound was Blister. The date acquired was: 08/23/2022. The wound has been in treatment 15 weeks. The wound is located on the Right,Dorsal Foot. The wound measures 0.4cm length x 0.3cm width x 0.1cm depth; 0.094cm^2 area and 0.009cm^3 volume. There is Fat Layer (Subcutaneous Tissue) exposed. There is no tunneling or undermining noted. There is a medium amount of serosanguineous drainage noted. The wound margin is distinct with the outline attached to the wound base. There is large (67-100%) pink granulation within the wound bed. There is a small (1-33%) amount of necrotic tissue within the wound bed including Adherent Slough.  The periwound skin appearance had no abnormalities noted for texture. The periwound skin appearance exhibited: Dry/Scaly. The periwound skin appearance did not exhibit: Erythema. Periwound temperature was noted as No Abnormality. Wound #3 status is Open. Original cause of wound was Laceration. The date acquired was: 12/18/2022. The wound is located on the Right,Medial Lower Leg. The wound measures 5.5cm length x 1.3cm width x 0.1cm depth; 5.616cm^2 area and 0.562cm^3 volume. There is Fat Layer (Subcutaneous Tissue) exposed. There is no tunneling or undermining noted. There is a medium amount of serosanguineous drainage noted. There is small (1-33%) red granulation within the wound bed. There is a large (67-100%) amount of necrotic tissue within the wound bed including Eschar and Adherent Slough. The periwound skin appearance had no abnormalities noted for texture. The periwound skin appearance had no abnormalities noted for moisture. The periwound skin appearance had no abnormalities noted for color. Periwound temperature was noted as No Abnormality. 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 There was a Excisional Skin/Subcutaneous Tissue Debridement with a total area of 0.12 sq cm performed by Fredirick Maudlin, MD. With the following instrument(s): Curette to remove Non-Viable tissue/material. Material removed includes Subcutaneous Tissue and Slough and after achieving pain control using Lidocaine 4% T opical Solution. No specimens were taken. A time out was conducted at 10:05, prior to the start of the procedure. A Minimum amount of bleeding was controlled with Pressure. The procedure was tolerated well with a pain level of 0 throughout and a pain level of 0 following the procedure. Post Debridement Measurements: 0.4cm length x 0.3cm width x 0.1cm depth; 0.009cm^3 volume. Character of Wound/Ulcer Post Debridement is improved. Post procedure Diagnosis Wound #1: Same as Pre-Procedure General Notes: Scribed for Dr. [name] by J.Scotton. Wound #3 Pre-procedure diagnosis of Wound #3 is a Trauma, Other located on the Right,Medial Lower Leg . There was a Excisional Skin/Subcutaneous Tissue Debridement with a total area of 7.15 sq cm performed by Fredirick Maudlin, MD. With the following instrument(s): Curette to remove Non-Viable tissue/material. Material removed includes Subcutaneous Tissue, Slough, and Other: skin after achieving pain control using Lidocaine 4% Topical Solution. No specimens were taken. A time out was conducted at 10:05, prior to the start of the procedure. A Minimum amount of bleeding was controlled  with Pressure. The procedure was tolerated well with a pain level of 0 throughout and a pain level of 0 following the procedure. Post Debridement Measurements: 5.5cm length x 1.3cm width x 0.1cm depth; 0.562cm^3 volume. Character of Wound/Ulcer Post Debridement is improved. Post procedure Diagnosis Wound #3: Same as Pre-Procedure General Notes: Scribed for Dr. [name] by J.Scotton. Plan Follow-up Appointments: Return Appointment in 1 week. - Dr. [name] Room 1 Bathing/ Shower/ Hygiene: Other Bathing/Shower/Hygiene Orders/Instructions: - Please do not get LEG WRAPS on Right leg wet. Sponge bathe or use a cast protector on right leg if showering/bathing. Edema Control - Lymphedema / SCD / Other: Avoid standing for long periods of time. Patient to wear own compression stockings every day. Compression stocking or Garment 20-30 mm/Hg pressure to: - left leg WOUND #1: - Foot Wound Laterality: Dorsal,  Right Cleanser: Soap and Water Every Other Day/15 Days Discharge Instructions: May shower and wash wound with dial antibacterial soap and water prior to dressing change. Cleanser: Wound Cleanser (Generic) Every Other Day/15 Days Discharge Instructions: Cleanse the wound with wound cleanser prior to applying a clean dressing using gauze sponges, not tissue or cotton balls. Prim Dressing: Sorbalgon AG Dressing 2x2 (in/in) Every Other Day/15 Days ary Discharge Instructions: Apply to wound bed as instructed Secondary Dressing: Woven Gauze Sponge, Non-Sterile 4x4 in (Generic) Every Other Day/15 Days Discharge Instructions: Apply over primary dressing as directed. Secondary Dressing: Zetuvit Plus Silicone Border Dressing 4x4 (in/in) (Generic) Every Other Day/15 Days [name], [name] ([id number]) [id number] Page 9 of 11 Discharge Instructions: Apply silicone border over primary dressing as directed. WOUND #3: - Lower Leg Wound Laterality: Right, Medial Cleanser: Soap and  Water 1 x Per Week/30 Days Discharge Instructions: May shower and wash wound with dial antibacterial soap and water prior to dressing change. Cleanser: Wound Cleanser 1 x Per Week/30 Days Discharge Instructions: Cleanse the wound with wound cleanser prior to applying a clean dressing using gauze sponges, not tissue or cotton balls. Prim Dressing: IODOFLEX 0.9% Cadexomer Iodine Pad 4x6 cm 1 x Per Week/30 Days ary Discharge Instructions: Apply to wound bed as instructed Secondary Dressing: ABD Pad, 8x10 1 x Per Week/30 Days Discharge Instructions: Apply over primary dressing as directed. Secured With: Elastic Bandage 4 inch (ACE bandage) 1 x Per Week/30 Days Discharge Instructions: Secure with ACE bandage as directed. Secured With: [employer], 4.5x3.1 (in/yd) 1 x Per Week/30 Days Discharge Instructions: Secure with Kerlix as directed. 01/03/2023: Her edema is fairly significant today and not at all well-controlled. The dorsal foot wound appears deeper, but this may be an illusion secondary to the swelling. There is a fair amount of slough accumulation in the wound. The medial leg wound, as expected, shows signs of skin necrosis and partial dehiscence. There is hematoma and necrotic fat visible. I used a curette to debride slough and nonviable subcutaneous tissue from the dorsal foot wound. We will continue silver alginate to this location. I used scissors and forceps to cut away the necrotic skin from the medial leg wound. I then used a curette to debride hematoma, slough, and nonviable subcutaneous tissue from the medial leg wound. This wound will benefit from additional chemical debridement so we are going to use Iodoflex in this location. She complained bitterly about the compression from Kerlix and Coban so I am going to use Kerlix and an Ace bandage as her edema is very poorly controlled, but her ABIs were elevated at 1.55 and therefore I cannot put her in 3 or 4 layer compression  without getting formal vascular studies. She will follow-up in 1 week's time. Electronic Signature(s) Signed: 01/03/2023 10:34:56 AM By: Fredirick Maudlin MD FACS Entered By: Fredirick Maudlin on 01/03/2023 10:34:56 -------------------------------------------------------------------------------- HxROS Details Patient Name: Date of Service: Sherry Masson RO N [name]. 01/03/2023 9:30 A M Medical Record Number: [id number] Patient Account Number: [id number] Date of Birth/Sex: Treating RN: 28-Mar-1946 (77 y.o. F) Primary Care Provider: Marcy Panning Other Clinician: Referring Provider: Treating Provider/Extender: Lacie Draft, ELA INE Weeks in Treatment: 15 Information Obtained From Patient Eyes Medical History: Negative for: Cataracts; Glaucoma; Optic Neuritis Ear/Nose/Mouth/Throat Medical History: Negative for: Chronic sinus problems/congestion; Middle ear problems Respiratory Medical History: Positive for: Asthma; Chronic Obstructive Pulmonary Disease (COPD) Cardiovascular Medical History: Positive for: Congestive Heart Failure; Hypertension Negative for: Coronary Artery Disease; Deep Vein Thrombosis; Hypotension; Myocardial  Infarction Past Medical History Notes: AFIB Endocrine Medical HistoryANIESA, [name] ([id number]) [id number] Page 10 of 11 Negative for: Type I Diabetes; Type II Diabetes Genitourinary Medical History: Past Medical History Notes: Stage 3 CKD Immunological Medical History: Negative for: Lupus Erythematosus; Raynauds; Scleroderma Integumentary (Skin) Medical History: Negative for: History of Burn Musculoskeletal Medical History: Positive for: Rheumatoid Arthritis Negative for: Gout Neurologic Medical History: Positive for: Neuropathy Oncologic Medical History: Positive for: Received Chemotherapy - currently receiving Immunizations Pneumococcal Vaccine: Received Pneumococcal Vaccination: Yes Received  Pneumococcal Vaccination On or After 60th Birthday: Yes Implantable Devices None Hospitalization / Surgery History Type of Hospitalization/Surgery reverse shoulder arthroplasty Rt- 2018 Family and Social History Unknown History: Yes; Cancer: No; Diabetes: Yes - Father; Heart Disease: Yes - Father; Former smoker - quit 2003; Marital Status - Married; Alcohol Use: Daily; Drug Use: Prior History; Caffeine Use: Daily; Financial Concerns: No; Food, Clothing or Shelter Needs: No; Support System Lacking: No; Transportation Concerns: No Electronic Signature(s) Signed: 01/03/2023 11:49:27 AM By: Fredirick Maudlin MD FACS Entered By: Fredirick Maudlin on 01/03/2023 10:31:35 -------------------------------------------------------------------------------- SuperBill Details Patient Name: Date of Service: Sherry Masson RO Ovidio Hanger 01/03/2023 Medical Record Number: [id number] Patient Account Number: [id number] Date of Birth/Sex: Treating RN: 10-Sep-1946 (77 y.o. 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 L97.812 Non-pressure chronic ulcer of other part of right lower leg with fat layer exposed G40.10 Chronic systolic (congestive) heart failure M06.9 Rheumatoid arthritis, unspecified Z79.52 Long term (current) use of systemic steroids I70.90 Unspecified atherosclerosis 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 Facility Procedures : CPT4 Code: [id number] Description: [place] - DEB SUBQ TISSUE 20 SQ CM/< ICD-10 Diagnosis Description 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  L97.812 Non-pressure chronic ulcer of other part of right lower leg with fat layer expo Modifier: without evidence of n sed Quantity: 1 ecrosis Physician Procedures : CPT4 Code Description Modifier [id number] - WC PHYS LEVEL 3 - EST PT 25 ICD-10 Diagnosis Description 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 evidence of n L97.812 Non-pressure chronic ulcer of  other part of right lower leg with fat layer exposed O75.64 Chronic systolic (congestive) heart failure Z79.52 Long term (current) use of systemic steroids Quantity: 1 ecrosis : [id number] - WC PHYS SUBQ TISS 20 SQ CM ICD-10 Diagnosis Description 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

## 2023-01-05 ENCOUNTER — Ambulatory Visit
Admission: RE | Admit: 2023-01-05 | Discharge: 2023-01-05 | Disposition: A | Discharge status: Home / Self Care | Payer: Medicare Other | Source: Ambulatory Visit | Attending: Cardiovascular Disease | Admitting: Cardiovascular Disease

## 2023-01-05 DIAGNOSIS — J90 Pleural effusion, not elsewhere classified: Secondary | ICD-10-CM

## 2023-01-05 DIAGNOSIS — R600 Localized edema: Secondary | ICD-10-CM

## 2023-01-05 DIAGNOSIS — Z856 Personal history of leukemia: Secondary | ICD-10-CM | POA: Diagnosis not present

## 2023-01-05 DIAGNOSIS — I119 Hypertensive heart disease without heart failure: Secondary | ICD-10-CM | POA: Diagnosis not present

## 2023-01-05 DIAGNOSIS — Z8709 Personal history of other diseases of the respiratory system: Secondary | ICD-10-CM | POA: Diagnosis not present

## 2023-01-09 ENCOUNTER — Other Ambulatory Visit: Payer: Self-pay

## 2023-01-09 ENCOUNTER — Inpatient Hospital Stay: Payer: Medicare Other

## 2023-01-09 DIAGNOSIS — Z5111 Encounter for antineoplastic chemotherapy: Secondary | ICD-10-CM | POA: Diagnosis not present

## 2023-01-09 DIAGNOSIS — C931 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: Secondary | ICD-10-CM | POA: Diagnosis not present

## 2023-01-09 LAB — CBC WITH DIFFERENTIAL/PLATELET
Abs Immature Granulocytes: 0.02 10*3/uL (ref 0.00–0.07)
Basophils Absolute: 0.3 10*3/uL — ABNORMAL HIGH (ref 0.0–0.1)
Basophils Relative: 5 %
Eosinophils Absolute: 0.2 10*3/uL (ref 0.0–0.5)
Eosinophils Relative: 4 %
HCT: 32.9 % — ABNORMAL LOW (ref 36.0–46.0)
Hemoglobin: 10.8 g/dL — ABNORMAL LOW (ref 12.0–15.0)
Immature Granulocytes: 0 %
Lymphocytes Relative: 38 %
Lymphs Abs: 2.3 10*3/uL (ref 0.7–4.0)
MCH: 36.1 pg — ABNORMAL HIGH (ref 26.0–34.0)
MCHC: 32.8 g/dL (ref 30.0–36.0)
MCV: 110 fL — ABNORMAL HIGH (ref 80.0–100.0)
Monocytes Absolute: 1.2 10*3/uL — ABNORMAL HIGH (ref 0.1–1.0)
Monocytes Relative: 20 %
Neutro Abs: 2 10*3/uL (ref 1.7–7.7)
Neutrophils Relative %: 33 %
Platelets: 116 10*3/uL — ABNORMAL LOW (ref 150–400)
RBC: 2.99 MIL/uL — ABNORMAL LOW (ref 3.87–5.11)
RDW: 15.1 % (ref 11.5–15.5)
WBC: 6 10*3/uL (ref 4.0–10.5)
nRBC: 0 % (ref 0.0–0.2)

## 2023-01-09 LAB — CMP (CANCER CENTER ONLY)
ALT: 12 U/L (ref 0–44)
AST: 20 U/L (ref 15–41)
Albumin: 3.2 g/dL — ABNORMAL LOW (ref 3.5–5.0)
Alkaline Phosphatase: 81 U/L (ref 38–126)
Anion gap: 7 (ref 5–15)
BUN: 14 mg/dL (ref 8–23)
CO2: 28 mmol/L (ref 22–32)
Calcium: 9.2 mg/dL (ref 8.9–10.3)
Chloride: 105 mmol/L (ref 98–111)
Creatinine: 0.82 mg/dL (ref 0.44–1.00)
GFR, Estimated: >60 mL/min (ref >60)
Glucose, Bld: 90 mg/dL (ref 70–99)
Potassium: 4.6 mmol/L (ref 3.5–5.1)
Sodium: 140 mmol/L (ref 135–145)
Total Bilirubin: 0.6 mg/dL (ref 0.3–1.2)
Total Protein: 5.7 g/dL — ABNORMAL LOW (ref 6.5–8.1)

## 2023-01-10 ENCOUNTER — Encounter (HOSPITAL_BASED_OUTPATIENT_CLINIC_OR_DEPARTMENT_OTHER): Payer: Medicare Other | Admitting: General Surgery

## 2023-01-10 DIAGNOSIS — S81801A Unspecified open wound, right lower leg, initial encounter: Secondary | ICD-10-CM | POA: Diagnosis not present

## 2023-01-10 DIAGNOSIS — S91311D Laceration without foreign body, right foot, subsequent encounter: Secondary | ICD-10-CM | POA: Diagnosis not present

## 2023-01-10 DIAGNOSIS — L97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 evidence of necrosis: Secondary | ICD-10-CM | POA: Diagnosis not present

## 2023-01-10 DIAGNOSIS — S9031XA Contusion of right foot, initial encounter: Secondary | ICD-10-CM | POA: Diagnosis not present

## 2023-01-10 DIAGNOSIS — G629 Polyneuropathy, unspecified: Secondary | ICD-10-CM | POA: Diagnosis not present

## 2023-01-10 DIAGNOSIS — L97812 Non-pressure chronic ulcer of other part of right lower leg with fat layer exposed: Secondary | ICD-10-CM | POA: Diagnosis not present

## 2023-01-10 DIAGNOSIS — I890 Lymphedema, not elsewhere classified: Secondary | ICD-10-CM | POA: Diagnosis not present

## 2023-01-10 NOTE — Progress Notes (Signed)
RIDDHI, GRETHER ([id number]) [id number] Page 1 of 11 Visit Report for 01/10/2023 Chief Complaint Document Details Patient Name: Date of Service: [name], [name] 01/10/2023 10:30 A M Medical Record Number: [id number] Patient Account Number: [id number] Date of Birth/Sex: Treating RN: 1946-01-08 (77 y.o. F) Primary Care Provider: Marcy Panning Other Clinician: Referring Provider: Treating Provider/Extender: Lacie Draft, ELA INE Weeks in Treatment: 16 Information Obtained from: Patient Chief Complaint Patient seen for complaints of Non-Healing Wounds. Electronic Signature(s) Signed: 01/10/2023 11:25:20 AM By: Fredirick Maudlin MD FACS Entered By: Fredirick Maudlin on 01/10/2023 11:25:20 -------------------------------------------------------------------------------- Debridement Details Patient Name: Date of Service: [name] 01/10/2023 10:30 A M Medical Record Number: [id number] Patient Account Number: [id number] Date of Birth/Sex: Treating RN: 06/22/1946 (77 y.o. F) [place], [place] Primary Care Provider: Judie Bonus INE Other Clinician: Referring Provider: Treating Provider/Extender: Lacie Draft, ELA INE Weeks in Treatment: 16 Debridement Performed for Assessment: Wound #3 Right,Medial Lower Leg Performed By: Physician Fredirick Maudlin, MD Debridement Type: Debridement Level of Consciousness (Pre-procedure): Awake and Alert Pre-procedure Verification/Time Out Yes - 11:03 Taken: Start Time: 11:04 Pain Control: Lidocaine 5% topical ointment T Area Debrided (L x W): otal 2.8 (cm) x 2 (cm) = 5.6 (cm) Tissue and other material debrided: Viable, Non-Viable, Slough, Subcutaneous, Slough Level: Skin/Subcutaneous Tissue Debridement Description: Excisional Instrument: Curette Bleeding: Minimum Hemostasis Achieved: Pressure Response to Treatment: Procedure was tolerated well Level of Consciousness (Post- Awake and  Alert procedure): Post Debridement Measurements of Total Wound Length: (cm) 2.8 Width: (cm) 2 Depth: (cm) 0.1 Volume: (cm) 0.44 Character of Wound/Ulcer Post Debridement: Requires Further Debridement Post Procedure Diagnosis Same as Pre-procedure SARAHELIZABETH, CONWAY H ([id number]) [phone].pdf Page 2 of 11 Notes Scribed for Dr. [name] by Blanche East, RN Electronic Signature(s) Signed: 01/10/2023 11:55:49 AM By: Fredirick Maudlin MD FACS Signed: 01/10/2023 5:25:22 PM By: Blanche East RN Entered By: Blanche East on 01/10/2023 11:05:41 -------------------------------------------------------------------------------- Debridement Details Patient Name: Date of Service: [name] 01/10/2023 10:30 A M Medical Record Number: [id number] Patient Account Number: [id number] Date of Birth/Sex: Treating RN: [birth year] (77 y.o. F) Zochol, [place] Primary Care Provider: Judie Bonus INE Other Clinician: Referring Provider: Treating Provider/Extender: Lacie Draft, ELA INE Weeks in Treatment: 16 Debridement Performed for Assessment: Wound #1 Right,Dorsal Foot Performed By: Physician Fredirick Maudlin, MD Debridement Type: Debridement Level of Consciousness (Pre-procedure): Awake and Alert Pre-procedure Verification/Time Out Yes - 11:03 Taken: Start Time: 11:04 Pain Control: Lidocaine 5% topical ointment T Area Debrided (L x W): otal 0.4 (cm) x 0.3 (cm) = 0.12 (cm) Tissue and other material debrided: Viable, Non-Viable, Slough, Subcutaneous, Slough Level: Skin/Subcutaneous Tissue Debridement Description: Excisional Instrument: Curette Bleeding: Minimum Hemostasis Achieved: Pressure Response to Treatment: Procedure was tolerated well Level of Consciousness (Post- Awake and Alert procedure): Post Debridement Measurements of Total Wound Length: (cm) 0.4 Width: (cm) 0.3 Depth: (cm) 0.1 Volume: (cm) 0.009 Character of Wound/Ulcer Post  Debridement: Requires Further Debridement Post Procedure Diagnosis Same as Pre-procedure Notes Scribed for Dr. [name] by Blanche East, RN Electronic Signature(s) Signed: 01/10/2023 11:55:49 AM By: Fredirick Maudlin MD FACS Signed: 01/10/2023 5:25:22 PM By: Blanche East RN Entered By: Blanche East on 01/10/2023 11:06:10 -------------------------------------------------------------------------------- HPI Details Patient Name: Date of Service: [name] 01/10/2023 10:30 A M Medical Record Number: [id number] Patient Account Number: [id number] Date of Birth/Sex: Treating RN: 1946-07-14 (77 y.o. F) Primary Care Provider: Marcy Panning Other Clinician: VANNAH, NADAL ([id number]) [id number] Page 3 of 11 Referring Provider: Treating  Provider/Extender: Lacie Draft, ELA INE Weeks in Treatment: 16 History of Present Illness HPI Description: ADMISSION 09/20/2022 This is a 77 year old woman with a past medical history significant for congestive heart failure, atrial fibrillation, rheumatoid arthritis on long-term steroid treatment, CMML currently receiving chemotherapy and moderate malnutrition. She presents to clinic today with a wound on the dorsum of her right foot. She says it started out as a bruise and then developed a bump which subsequently broke open. She says she was sent to our clinic by her oncologist. She is not diabetic and does not smoke. ABI in clinic today was 1.55. She has had formal segmental arterial Dopplers done, a little over a year ago, which did not show any evidence of occlusive vascular disease. On the dorsal aspect of the right foot, there is a wound with a lot of old hematoma, slough, eschar, and and nonviable subcutaneous tissue. T the best of my o ability to discern, it does appear to involve the muscle layer, but there is no evidence of necrosis. The wound is pouring serous fluid; the patient has 3+  pitting edema to the bilateral lower extremities. 09/29/2022: The patient came to clinic today with nothing but a T overlying her wound. She is accompanied by her husband today. She has not elfa communicated with her cardiologist regarding her fluid overload. She still has 3+ pitting edema bilaterally. The wound is still pouring serous fluid, although not quite as effusively as at her initial visit. There is still quite a bit of nonviable tissue and hematoma present. 10/13; patient's wound on the right lateral lower leg his eschared over and may well be on its way to healing. The real problem here is on the right dorsal foot just proximal to her toes. The patient is still puzzled by the cause of this. Looking at a picture on the patient's smart phone there is a suggestion that this might have been a hematoma which seems to fit what Dr. [name] had felt on her initial evaluation. They have been using iodoform packing kerlix and Coban. 10/20; right lower leg is healed. We are approved for snap VAC on the right foot. We will try to arrange home health but until then she is going to need a nurse visit on Tuesday 10/20/2022: It has been 2 weeks since I have seen the wound. It has improved significantly since then. She is currently in a snap VAC and I think this has made quite a difference. The overall wound measurements are smaller. There is no massive drainage, as there had been previously. There is some good granulation tissue forming underneath a bit of slough. 10/30/2022: The wound continues to improve with use of the snap VAC. The swelling in her foot is better. The undermining continues to close in. There is a layer of slough overlying good granulation tissue. 11/06/2022: The wound has contracted further. The undermining is still present but the wound surface is better and her edema control is good. 11/13/2022: The wound is smaller again and the undermining has decreased as well. There is slough  accumulation over a nice bed of granulation tissue. 11/20/2022: We did not have any snap VAC devices in clinic last week so the wound was just packed. It did well and is smaller with less undermining today. There is still slough present on the surface. 11/29/2022: The wound has contracted considerably and the undermining has resolved. There is still some slough on the wound surface. 12/13; slough on the surface of the wound  required debridement however afterwards the granulation looks healthy the wound has been contracting. She has been using a snap VAC with collagen 12/13/2022: The wound was too small and superficial last week and the snap VAC was discontinued. They have been using Hydrofera Blue due to the hypertrophic granulation tissue. The wound has a little bit of slough on the surface. 12/28/2022: The wound on her dorsal foot continues to contract and is quite superficial at this point. There is a little bit of slough on the surface. Unfortunately, she struck her right medial leg on a hard surface on Christmas, resulting in a laceration. It was sutured in the emergency room. She just came from having the sutures removed. Overall, I do not think the skin is likely to survive, but for now there is no grossly open ulcer at this site. 01/03/2023: Her edema is fairly significant today and not at all well-controlled. The dorsal foot wound appears deeper, but this may be an illusion secondary to the swelling. There is a fair amount of slough accumulation in the wound. The medial leg wound, as expected, shows signs of skin necrosis and partial dehiscence. There is hematoma and necrotic fat visible. 01/10/2023: She removed her wrap about 4 days ago; despite this, both wounds are smaller and cleaner. There is still slough accumulation in both sites. Electronic Signature(s) Signed: 01/10/2023 11:26:16 AM By: Fredirick Maudlin MD FACS Entered By: Fredirick Maudlin on 01/10/2023  11:26:15 -------------------------------------------------------------------------------- Physical Exam Details Patient Name: Date of Service: [name] 01/10/2023 10:30 A M Medical Record Number: [id number] Patient Account Number: [id number] Date of Birth/Sex: Treating RN: 27-Apr-1946 (77 y.o. F) Primary Care Provider: Marcy Panning Other Clinician: Referring Provider: Treating Provider/Extender: Lacie Draft, ELA INE Weeks in Treatment: [address], [place] ([id number]) [id number] Page 4 of 11 . . . . no acute distress. Respiratory Normal work of breathing on room air. Notes 01/10/2023: She removed her wrap about 4 days ago; despite this, both wounds are smaller and cleaner. There is still slough accumulation in both sites. Electronic Signature(s) Signed: 01/10/2023 11:26:57 AM By: Fredirick Maudlin MD FACS Entered By: Fredirick Maudlin on 01/10/2023 11:26:57 -------------------------------------------------------------------------------- Physician Orders Details Patient Name: Date of Service: [name] 01/10/2023 10:30 A M Medical Record Number: [id number] Patient Account Number: [id number] Date of Birth/Sex: Treating RN: 1946-03-26 (77 y.o. 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 - Dr. [name] Room 1 Return A Thursday 01/18/23 at 3:00pm Bathing/ Shower/ Hygiene Other Bathing/Shower/Hygiene Orders/Instructions: - Please do not get LEG WRAPS on Right leg wet. Sponge bathe or use a cast protector on right leg if showering/bathing. Edema Control - Lymphedema / SCD / Other Left Lower Extremity Avoid standing for long periods of time. Patient to wear own compression stockings every day. Compression stocking or Garment 20-30 mm/Hg pressure to: - left leg Wound Treatment Wound #1 - Foot Wound Laterality: Dorsal, Right Cleanser: Soap and Water  Every Other Day/15 Days Discharge Instructions: May shower and wash wound with dial antibacterial soap and water prior to dressing change. Cleanser: Wound Cleanser (Generic) Every Other Day/15 Days Discharge Instructions: Cleanse the wound with wound cleanser prior to applying a clean dressing using gauze sponges, not tissue or cotton balls. Prim Dressing: Sorbalgon AG Dressing 2x2 (in/in) Every Other Day/15 Days ary Discharge Instructions: Apply to wound bed as instructed Secondary Dressing: Woven Gauze Sponge, Non-Sterile 4x4 in (Generic) Every Other Day/15 Days Discharge Instructions: Apply over primary dressing as directed. Secondary Dressing: Zetuvit Plus Silicone Border Dressing 4x4 (in/in) (Generic) Every Other Day/15 Days Discharge Instructions: Apply silicone border over primary dressing as directed. Wound #3 - Lower Leg Wound Laterality: Right, Medial Evanny, Ellerbe Tamilyn H ([id number]) [phone].pdf Page 5 of 11 Cleanser: Soap and Water 1 x Per Week/30 Days Discharge Instructions: May shower and wash wound with dial antibacterial soap and water prior to dressing change. Cleanser: Wound Cleanser 1 x Per Week/30 Days Discharge Instructions: Cleanse the wound with wound cleanser prior to applying a clean dressing using  gauze sponges, not tissue or cotton balls. Prim Dressing: Sorbalgon AG Dressing 2x2 (in/in) 1 x Per Week/30 Days ary Discharge Instructions: Apply to wound bed as instructed Secondary Dressing: ABD Pad, 8x10 1 x Per Week/30 Days Discharge Instructions: Apply over primary dressing as directed. Secured With: Elastic Bandage 4 inch (ACE bandage) 1 x Per Week/30 Days Discharge Instructions: Secure with ACE bandage as directed. Secured With: [employer], 4.5x3.1 (in/yd) 1 x Per Week/30 Days Discharge Instructions: Secure with Kerlix as directed. Electronic Signature(s) Signed: 01/10/2023 11:55:49 AM By: Fredirick Maudlin MD FACS Entered By: Fredirick Maudlin on 01/10/2023 11:27:12 -------------------------------------------------------------------------------- Problem List Details Patient Name: Date of Service: [name] 01/10/2023 10:30 A M Medical Record Number: [id number] Patient Account Number: [id number] Date of Birth/Sex: Treating RN: 1946/01/03 (77 y.o. 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 09/20/2022 No Yes without evidence of necrosis L97.812 Non-pressure chronic ulcer of other part of right lower leg with fat layer 12/28/2022 No Yes exposed N47.09 Chronic systolic (congestive) heart failure 09/20/2022 No Yes M06.9 Rheumatoid arthritis, unspecified 09/20/2022 No Yes Z79.52 Long term (current) use of systemic steroids 09/20/2022 No Yes I70.90 Unspecified atherosclerosis 09/20/2022 No Yes 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 01/10/2023 10:30 A M Medical Record Number: [id number] Patient Account Number: [id number] Date of Birth/Sex: Treating RN: 01-03-1946 (77 y.o. F) Primary Care Provider: Marcy Panning Other Clinician: Referring Provider: Treating Provider/Extender: Lacie Draft, ELA INE Weeks in Treatment: 16 Subjective Chief Complaint Information obtained from Patient Patient seen for complaints of Non-Healing Wounds. History of Present Illness (HPI) ADMISSION 09/20/2022 This is a 77 year old woman with a past medical history significant for congestive heart failure, atrial fibrillation, rheumatoid arthritis on long-term steroid treatment, CMML currently receiving chemotherapy and moderate malnutrition. She  presents to clinic today with a wound on the dorsum of her right foot. She says it started out as a bruise and then developed a bump which subsequently broke open. She says she was sent to our clinic by her oncologist. She is not diabetic and does not smoke. ABI in clinic today was 1.55. She has had formal segmental arterial Dopplers done, a little over a year ago, which did not show any evidence of occlusive vascular disease. On the dorsal aspect of the right foot, there is a wound with a lot of old hematoma, slough, eschar, and and nonviable subcutaneous tissue. T the best of my o ability to discern, it does appear to involve the muscle layer, but there is no evidence of necrosis. The wound is pouring serous fluid; the patient has 3+  pitting edema to the bilateral lower extremities. 09/29/2022: The patient came to clinic today with nothing but a T overlying her wound. She is accompanied by her husband today. She has not elfa communicated with her cardiologist regarding her fluid overload. She still has 3+ pitting edema bilaterally. The wound is still pouring serous fluid, although not quite as effusively as at her initial visit. There is still quite a bit of nonviable tissue and hematoma present. 10/13; patient's wound on the right lateral lower leg his eschared over and may well be on its way to healing. The real problem here is on the right dorsal foot just proximal to her toes. The patient is still puzzled by the cause of this. Looking at a picture on the patient's smart phone there is a suggestion that this might have been a hematoma which seems to fit what Dr. [name] had felt on her initial evaluation. They have been using iodoform packing kerlix and Coban. 10/20; right lower leg is healed. We are approved for snap VAC on the right foot. We will try to arrange home health but until then she is going to need a nurse visit on Tuesday 10/20/2022: It has been 2 weeks since I have seen the wound. It has improved significantly since then. She is currently in a snap VAC and I think this has made quite a difference. The overall wound measurements are smaller. There is no massive drainage, as there had been previously. There is some good granulation tissue forming underneath a bit of slough. 10/30/2022: The wound continues to improve with use of the snap VAC. The swelling in her foot is better. The undermining continues to close in. There is a layer of slough overlying good granulation tissue. 11/06/2022: The wound has contracted further. The undermining is still present but the wound surface is better and her edema control is good. 11/13/2022: The wound is smaller again and the undermining has decreased as well. There is slough  accumulation over a nice bed of granulation tissue. 11/20/2022: We did not have any snap VAC devices in clinic last week so the wound was just packed. It did well and is smaller with less undermining today. There is still slough present on the surface. 11/29/2022: The wound has contracted considerably and the undermining has resolved. There is still some slough on the wound surface. 12/13; slough on the surface of the wound required debridement however afterwards the granulation looks healthy the wound has been contracting. She has been using a snap VAC with collagen TEEHAN, Melonee H ([id number]) [phone].pdf Page 7 of 11 12/13/2022: The wound was too small and superficial last week and the snap VAC was discontinued. They have  been using Hydrofera Blue due to the hypertrophic granulation tissue. The wound has a little bit of slough on the surface. 12/28/2022: The wound on her dorsal foot continues to contract and is quite superficial at this point. There is a little bit of slough on the surface. Unfortunately, she struck her right medial leg on a hard surface on Christmas, resulting in a laceration. It was sutured in the emergency room. She just came from having the sutures removed. Overall, I do not think the skin is likely to survive, but for now there is no grossly open ulcer at this site. 01/03/2023: Her edema is fairly significant today and not at all well-controlled. The dorsal foot wound appears deeper, but this may be an illusion secondary to the swelling. There is a fair amount of slough accumulation in the wound. The medial leg wound, as expected, shows signs of skin necrosis and partial dehiscence. There is hematoma and necrotic fat visible. 01/10/2023: She removed her wrap about 4 days ago; despite this, both wounds are smaller and cleaner. There is still slough accumulation in both sites. Patient History Information obtained from Patient. Family History Unknown  History, Diabetes - Father, Heart Disease - Father, No family history of Cancer. Social History Former smoker - quit 2003, Marital Status - Married, Alcohol Use - Daily, Drug Use - Prior History, Caffeine Use - Daily. Medical History Eyes Denies history of Cataracts, Glaucoma, Optic Neuritis Ear/Nose/Mouth/Throat Denies history of Chronic sinus problems/congestion, Middle ear problems Respiratory Patient has history of Asthma, Chronic Obstructive Pulmonary Disease (COPD) Cardiovascular Patient has history of Congestive Heart Failure, Hypertension Denies history of Coronary Artery Disease, Deep Vein Thrombosis, Hypotension, Myocardial Infarction Endocrine Denies history of Type I Diabetes, Type II Diabetes Immunological Denies history of Lupus Erythematosus, Raynaudoos, Scleroderma Integumentary (Skin) Denies history of History of Burn Musculoskeletal Patient has history of Rheumatoid Arthritis Denies history of Gout Neurologic Patient has history of Neuropathy Oncologic Patient has history of Received Chemotherapy - currently receiving Hospitalization/Surgery History - reverse shoulder arthroplasty Rt- 2018. Medical A Surgical History Notes nd Cardiovascular AFIB Genitourinary Stage 3 CKD Objective Constitutional no acute distress. Vitals Time Taken: 10:53 AM, Height: 64 in, Weight: 111 lbs, BMI: 19.1, Temperature: 98.4 F, Pulse: 79 bpm, Respiratory Rate: 18 breaths/min, Blood Pressure: 127/72 mmHg. Respiratory Normal work of breathing on room air. General Notes: 01/10/2023: She removed her wrap about 4 days ago; despite this, both wounds are smaller and cleaner. There is still slough accumulation in both sites. Integumentary (Hair, Skin) Wound #1 status is Open. Original cause of wound was Blister. The date acquired was: 08/23/2022. The wound has been in treatment 16 weeks. The wound is located on the Right,Dorsal Foot. The wound measures 0.4cm length x 0.3cm width x  0.1cm depth; 0.094cm^2 area and 0.009cm^3 volume. There is Fat Layer (Subcutaneous Tissue) exposed. There is no tunneling or undermining noted. There is a medium amount of serosanguineous drainage noted. The wound margin is distinct with the outline attached to the wound base. There is large (67-100%) pink granulation within the wound bed. There is a small (1-33%) amount of necrotic tissue within the wound bed including Adherent Slough. The periwound skin appearance had no abnormalities noted for texture. The periwound skin appearance exhibited: Dry/Scaly. The periwound skin appearance did not exhibit: Erythema. Periwound temperature was noted as No Abnormality. Wound #3 status is Open. Original cause of wound was Laceration. The date acquired was: 12/18/2022. The wound has been in treatment 1 weeks. The wound Maue, Anayelli  Lemmie Evens ([id number]) [id number] Page 8 of 11 is located on the Right,Medial Lower Leg. The wound measures 2.8cm length x 2cm width x 0.1cm depth; 4.398cm^2 area and 0.44cm^3 volume. There is Fat Layer (Subcutaneous Tissue) exposed. There is no tunneling or undermining noted. There is a medium amount of serosanguineous drainage noted. There is small (1-33%) red granulation within the wound bed. There is a large (67-100%) amount of necrotic tissue within the wound bed including Eschar and Adherent Slough. The periwound skin appearance had no abnormalities noted for texture. The periwound skin appearance had no abnormalities noted for moisture. The periwound skin appearance had no abnormalities noted for color. Periwound temperature was noted as No Abnormality. 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 There was a Excisional Skin/Subcutaneous Tissue Debridement with a total area of 0.12 sq cm performed by Fredirick Maudlin, MD. With the following instrument(s): Curette to remove Viable and Non-Viable tissue/material. Material removed includes Subcutaneous Tissue and Slough and after achieving pain control using Lidocaine 5% topical ointment. No specimens were taken. A time out was conducted at 11:03, prior to the start of the procedure. A Minimum amount of bleeding was controlled with Pressure. The procedure was tolerated well. Post Debridement Measurements: 0.4cm length x 0.3cm width x 0.1cm depth; 0.009cm^3 volume. Character of Wound/Ulcer Post Debridement requires further debridement. Post procedure Diagnosis Wound #1: Same as Pre-Procedure General Notes: Scribed for Dr. [name] by Blanche East, RN. Wound #3 Pre-procedure diagnosis of Wound #3 is a Trauma, Other located on the Right,Medial Lower Leg . There was a Excisional Skin/Subcutaneous Tissue Debridement with a total area of 5.6 sq cm performed by Fredirick Maudlin, MD. With the following instrument(s): Curette to remove Viable and Non-Viable tissue/material. Material removed includes Subcutaneous Tissue and Slough and after achieving pain control using Lidocaine 5% topical ointment. No specimens were taken. A time out was conducted at 11:03, prior to the start of the procedure. A Minimum amount of bleeding was controlled with Pressure. The procedure was tolerated well. Post Debridement Measurements: 2.8cm length x 2cm width x 0.1cm depth; 0.44cm^3 volume. Character of Wound/Ulcer Post Debridement requires further debridement. Post procedure Diagnosis Wound #3: Same as  Pre-Procedure General Notes: Scribed for Dr. [name] by Blanche East, RN. Plan Follow-up Appointments: Return Appointment in 1 week. - Dr. [name] Room 1 Thursday 01/18/23 at 3:00pm Bathing/ Shower/ Hygiene: Other Bathing/Shower/Hygiene Orders/Instructions: - Please do not get LEG WRAPS on Right leg wet. Sponge bathe or use a cast protector on right leg if showering/bathing. Edema Control - Lymphedema / SCD / Other: Avoid standing for long periods of time. Patient to wear own compression stockings every day. Compression stocking or Garment 20-30 mm/Hg pressure to: - left leg WOUND #1: - Foot Wound Laterality: Dorsal, Right Cleanser: Soap and Water Every Other Day/15 Days Discharge Instructions: May shower and wash wound with dial antibacterial soap and water prior to dressing change. Cleanser: Wound Cleanser (Generic) Every Other Day/15 Days Discharge Instructions: Cleanse the wound with wound cleanser prior to applying a clean dressing using gauze sponges, not tissue or cotton balls. Prim Dressing: [place] 2x2 (  in/in) Every Other Day/15 Days ary Discharge Instructions: Apply to wound bed as instructed Secondary Dressing: Woven Gauze Sponge, Non-Sterile 4x4 in (Generic) Every Other Day/15 Days Discharge Instructions: Apply over primary dressing as directed. Secondary Dressing: Zetuvit Plus Silicone Border Dressing 4x4 (in/in) (Generic) Every Other Day/15 Days Discharge Instructions: Apply silicone border over primary dressing as directed. WOUND #3: - Lower Leg Wound Laterality: Right, Medial Cleanser: Soap and Water 1 x Per Week/30 Days Discharge Instructions: May shower and wash wound with dial antibacterial soap and water prior to dressing change. Cleanser: Wound Cleanser 1 x Per Week/30 Days Discharge Instructions: Cleanse the wound with wound cleanser prior to applying a clean dressing using gauze sponges, not tissue or cotton balls. Prim Dressing: Sorbalgon [employer] Dressing  2x2 (in/in) 1 x Per Week/30 Days ary Discharge Instructions: Apply to wound bed as instructed Secondary Dressing: ABD Pad, 8x10 1 x Per Week/30 Days Discharge Instructions: Apply over primary dressing as directed. Secured With: Elastic Bandage 4 inch (ACE bandage) 1 x Per Week/30 Days Discharge Instructions: Secure with ACE bandage as directed. Secured With: [employer], 4.5x3.1 (in/yd) 1 x Per Week/30 Days Discharge Instructions: Secure with Kerlix as directed. ARACELY, RICKETT ([id number]) [id number] Page 9 of 11 01/10/2023: She removed her wrap about 4 days ago; despite this, both wounds are smaller and cleaner. There is still slough accumulation in both sites. I used a curette to debride slough and nonviable subcutaneous tissue from both wounds. We will use silver alginate to both sites. Kerlix and ace wrap. She now understands the importance of edema control in her wound healing and said that she would leave wraps in place. She will follow-up in 1 week. Electronic Signature(s) Signed: 01/10/2023 11:29:00 AM By: Fredirick Maudlin MD FACS Entered By: Fredirick Maudlin on 01/10/2023 11:29:00 -------------------------------------------------------------------------------- HxROS Details Patient Name: Date of Service: [name] 01/10/2023 10:30 A M Medical Record Number: [id number] Patient Account Number: [id number] Date of Birth/Sex: Treating RN: October 19, 1946 (77 y.o. F) Primary Care Provider: Marcy Panning Other Clinician: Referring Provider: Treating Provider/Extender: Lacie Draft, ELA INE Weeks in Treatment: 16 Information Obtained From Patient Eyes Medical History: Negative for: Cataracts; Glaucoma; Optic Neuritis Ear/Nose/Mouth/Throat Medical History: Negative for: Chronic sinus problems/congestion; Middle ear problems Respiratory Medical History: Positive for: Asthma; Chronic Obstructive Pulmonary Disease  (COPD) Cardiovascular Medical History: Positive for: Congestive Heart Failure; Hypertension Negative for: Coronary Artery Disease; Deep Vein Thrombosis; Hypotension; Myocardial Infarction Past Medical History Notes: AFIB Endocrine Medical History: Negative for: Type I Diabetes; Type II Diabetes Genitourinary Medical History: Past Medical History Notes: Stage 3 CKD Immunological Medical History: Negative for: Lupus Erythematosus; Raynauds; Scleroderma Integumentary (Skin) Medical History: Negative for: History of Burn VONITA, CALLOWAY ([id number]) [id number] Page 10 of 11 Musculoskeletal Medical History: Positive for: Rheumatoid Arthritis Negative for: Gout Neurologic Medical History: Positive for: Neuropathy Oncologic Medical History: Positive for: Received Chemotherapy - currently receiving Immunizations Pneumococcal Vaccine: Received Pneumococcal Vaccination: Yes Received Pneumococcal Vaccination On or After 60th Birthday: Yes Implantable Devices None Hospitalization / Surgery History Type of Hospitalization/Surgery reverse shoulder arthroplasty Rt- 2018 Family and Social History Unknown History: Yes; Cancer: No; Diabetes: Yes - Father; Heart Disease: Yes - Father; Former smoker - quit 2003; Marital Status - Married; Alcohol Use: Daily; Drug Use: Prior History; Caffeine Use: Daily; Financial Concerns: No; Food, Clothing or Shelter Needs: No; Support System Lacking: No; Transportation Concerns: No Electronic Signature(s) Signed: 01/10/2023 11:55:49 AM By: Fredirick Maudlin MD FACS Entered By: Fredirick Maudlin on 01/10/2023  11:26:35 -------------------------------------------------------------------------------- SuperBill Details Patient Name: Date of Service: JMYA, ULIANO 01/10/2023 Medical Record Number: [id number] Patient Account Number: [id number] Date of Birth/Sex: Treating RN: [birth year] (77 y.o. 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 Facility Procedures : MCKENSI, REDINGER Code: [id number] [place] ([id number] Description: 11042 - DEB SUBQ TISSUE 20 SQ CM/< ICD-10 Diagnosis Description 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 [id number]) [phone] L97.812 Non-pressure chronic ulcer of other part of right lower  leg with fat layer exposed Modifier: without evidence of n [id number] Quantity: 1 ecrosis df Page 11 of 11 Physician Procedures : CPT4 Code Description Modifier [id number] 17494 - WC PHYS LEVEL 3 - EST PT 25 ICD-10 Diagnosis Description 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 evidence of n L97.812 Non-pressure chronic ulcer of  other part of right lower leg with fat layer exposed Z79.52 Long term (current) use of systemic steroids W96.75 Chronic systolic (congestive) heart failure Quantity: 1 ecrosis : [id number] 11042 - WC PHYS SUBQ TISS 20 SQ CM ICD-10 Diagnosis Description 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

## 2023-01-10 NOTE — Progress Notes (Signed)
Sherry, [name] ([id number]) [id number] Page 1 of 9 Visit Report for 01/10/2023 Arrival Information Details Patient Name: Date of Service: [name], [name] 01/10/2023 10:30 A M Medical Record Number: [id number] Patient Account Number: [id number] Date of Birth/Sex: Treating [name]: March 19, 1946 (77 y.o. [name], [name] Primary Care [name]: [name] [name] Other Clinician: Referring [name]: Treating [name]/Extender: [name], [name] Weeks in Treatment: 16 Visit Information History Since Last Visit Added or deleted any medications: No Patient Arrived: Wheel Chair Any new allergies or adverse reactions: No Arrival Time: 10:52 Had a fall or experienced change in No Accompanied By: husband activities of daily living that may affect Transfer Assistance: Manual risk of falls: Patient Identification Verified: Yes Signs or symptoms of abuse/neglect since last visito No Secondary Verification Process Completed: Yes Hospitalized since last visit: No Patient Requires Transmission-Based Precautions: No Implantable device outside of the clinic excluding No Patient Has Alerts: No cellular tissue based products placed in the center since last visit: Has Compression in Place as Prescribed: Yes Pain Present Now: Yes Electronic Signature(s) Signed: 01/10/2023 5:25:22 PM By: [name] Entered By: Sherry East on 01/10/2023 10:53:36 -------------------------------------------------------------------------------- Encounter Discharge Information Details Patient Name: Date of Service: [name]. 01/10/2023 10:30 A M Medical Record Number: [id number] Patient Account Number: [id number] Date of Birth/Sex: Treating [name]: 08/31/1946 (77 y.o. [name], [name] Primary Care Dima Ferrufino: Marcy Panning Other Clinician: Referring Azalia Neuberger: Treating Garnett Nunziata/Extender: [name], [name] Weeks in Treatment: 71 Encounter Discharge  Information Items Post Procedure Vitals Discharge Condition: Stable Temperature (F): 98.4 Ambulatory Status: Wheelchair Pulse (bpm): 79 Discharge Destination: Home Respiratory Rate (breaths/min): 18 Transportation: Private Auto Blood Pressure (mmHg): 127/72 Accompanied By: spouse Schedule Follow-up Appointment: Yes Clinical Summary of Care: Electronic Signature(s) Signed: 01/10/2023 5:25:22 PM By: [name] Entered By: Sherry East on 01/10/2023 11:10:13 [name] ([id number]) [id number] Page 2 of 9 -------------------------------------------------------------------------------- Lower Extremity Assessment Details Patient Name: Date of Service: [name] 01/10/2023 10:30 A M Medical Record Number: [id number] Patient Account Number: [id number] Date of Birth/Sex: Treating [name]: 04/19/1946 (77 y.o. [name], Engelhard Primary Care Arnulfo Batson: Marcy Panning Other Clinician: Referring Anndee Connett: Treating Bettyanne Dittman/Extender: [name], [name] Weeks in Treatment: 16 Edema Assessment Assessed: [Left: No] [Right: No] Edema: [Left: N] [Right: o] Calf Left: Right: Point of Measurement: From Medial Instep 27 cm Ankle Left: Right: Point of Measurement: From Medial Instep 22 cm Vascular Assessment Pulses: Dorsalis Pedis Palpable: [Right:Yes] Electronic Signature(s) Signed: 01/10/2023 5:25:22 PM By: [name] Entered By: Sherry East on 01/10/2023 10:54:40 -------------------------------------------------------------------------------- Multi Wound Chart Details Patient Name: Date of Service: [name]. 01/10/2023 10:30 A M Medical Record Number: [id number] Patient Account Number: [id number] Date of Birth/Sex: Treating [name]: Apr 27, 1946 (77 y.o. F) Primary Care Rosalia Mcavoy: [name] [name] Other Clinician: Referring Dajanee Voorheis: Treating Tudor Chandley/Extender: [name], [name] Weeks in Treatment:  16 Vital Signs Height(in): 64 Pulse(bpm): 79 Weight(lbs): 111 Blood Pressure(mmHg): 127/72 Body Mass Index(BMI): 19.1 Temperature(F): 98.4 Respiratory Rate(breaths/min): 18 [1:Photos:] [N/A:N/A [id number] Page 3 of 9] Right, Dorsal Foot Right, Medial Lower Leg N/A Wound Location: Blister Laceration N/A Wounding Event: Lymphedema Trauma, Other N/A Primary Etiology: Asthma, Chronic Obstructive Asthma, Chronic Obstructive N/A Comorbid History: Pulmonary Disease (COPD), Pulmonary Disease (COPD), Congestive Heart Failure, Congestive Heart Failure, Hypertension, Rheumatoid Arthritis, Hypertension, Rheumatoid Arthritis, Neuropathy, Received Chemotherapy Neuropathy, Received Chemotherapy 08/23/2022 12/18/2022 N/A Date Acquired: 16 1 N/A Weeks of Treatment: Open Open N/A Wound  Status: No No N/A Wound Recurrence: 0.4x0.3x0.1 2.8x2x0.1 N/A Measurements L x W x D (cm) 0.094 4.398 N/A A (cm) : rea 0.009 0.44 N/A Volume (cm) : 90.90% 59.30% N/A % Reduction in A rea: 98.60% 59.30% N/A % Reduction in Volume: Full Thickness Without Exposed Full Thickness Without Exposed N/A Classification: Support Structures Support Structures Medium Medium N/A Exudate A mount: Serosanguineous Serosanguineous N/A Exudate Type: red, brown red, brown N/A Exudate Color: Distinct, outline attached N/A N/A Wound Margin: Large (67-100%) Small (1-33%) N/A Granulation A mount: Pink Red N/A Granulation Quality: Small (1-33%) Large (67-100%) N/A Necrotic A mount: Adherent Slough Eschar, Adherent Slough N/A Necrotic Tissue: Fat Layer (Subcutaneous Tissue): Yes Fat Layer (Subcutaneous Tissue): Yes N/A Exposed Structures: Fascia: No Fascia: No Tendon: No Tendon: No Muscle: No Muscle: No Joint: No Joint: No Bone: No Bone: No Small (1-33%) Small (1-33%) N/A Epithelialization: Debridement - Excisional Debridement - Excisional N/A Debridement: Pre-procedure  Verification/Time Out 11:03 11:03 N/A Taken: Lidocaine 5% topical ointment Lidocaine 5% topical ointment N/A Pain Control: Subcutaneous, Slough Subcutaneous, Slough N/A Tissue Debrided: Skin/Subcutaneous Tissue Skin/Subcutaneous Tissue N/A Level: 0.12 5.6 N/A Debridement A (sq cm): rea Curette Curette N/A Instrument: Minimum Minimum N/A Bleeding: Pressure Pressure N/A Hemostasis A chieved: Procedure was tolerated well Procedure was tolerated well N/A Debridement Treatment Response: 0.4x0.3x0.1 2.8x2x0.1 N/A Post Debridement Measurements L x W x D (cm) 0.009 0.44 N/A Post Debridement Volume: (cm) No Abnormalities Noted No Abnormalities Noted N/A Periwound Skin Texture: Dry/Scaly: Yes No Abnormalities Noted N/A Periwound Skin Moisture: Erythema: No No Abnormalities Noted N/A Periwound Skin Color: No Abnormality No Abnormality N/A Temperature: Debridement Debridement N/A Procedures Performed: Treatment Notes Wound #1 (Foot) Wound Laterality: Dorsal, Right Cleanser Soap and Water Discharge Instruction: May shower and wash wound with dial antibacterial soap and water prior to dressing change. Wound Cleanser Discharge Instruction: Cleanse the wound with wound cleanser prior to applying a clean dressing using gauze sponges, not tissue or cotton balls. Peri-Wound Care Topical Primary Dressing Sorbalgon AG Dressing 2x2 (in/in) Discharge Instruction: Apply to wound bed as instructed Secondary Dressing Woven Gauze Sponge, Non-Sterile 4x4 in Discharge Instruction: Apply over primary dressing as directed. Zetuvit Plus Silicone Border Dressing 4x4 (in/in) Discharge Instruction: Apply silicone border over primary dressing as directed. Secured With [employer], Selinda [name] ([id number]) [id number] Page 4 of 9 Compression Stockings Add-Ons Wound #3 (Lower Leg) Wound Laterality: Right, Medial Cleanser Soap and Water Discharge Instruction:  May shower and wash wound with dial antibacterial soap and water prior to dressing change. Wound Cleanser Discharge Instruction: Cleanse the wound with wound cleanser prior to applying a clean dressing using gauze sponges, not tissue or cotton balls. Peri-Wound Care Topical Primary Dressing Sorbalgon AG Dressing 2x2 (in/in) Discharge Instruction: Apply to wound bed as instructed Secondary Dressing ABD Pad, 8x10 Discharge Instruction: Apply over primary dressing as directed. Secured With Elastic Bandage 4 inch (ACE bandage) Discharge Instruction: Secure with ACE bandage as directed. Kerlix Roll Sterile, 4.5x3.1 (in/yd) Discharge Instruction: Secure with Kerlix as directed. Compression Wrap Compression Stockings Add-Ons Electronic Signature(s) Signed: 01/10/2023 11:10:23 AM By: Fredirick Maudlin MD FACS Entered By: Fredirick Maudlin on 01/10/2023 11:10:23 -------------------------------------------------------------------------------- Multi-Disciplinary Care Plan Details Patient Name: Date of Service: [name]. 01/10/2023 10:30 A M Medical Record Number: [id number] Patient Account Number: [id number] Date of Birth/Sex: Treating [name]: [birth year] (77 y.o. [name], [name] Primary Care Jeena Arnett: Marcy Panning Other Clinician: Referring Latrisha Coiro: Treating Natahlia Hoggard/Extender: [name], [name] Weeks in Treatment: [phone] Active  Inactive Peripheral Neuropathy Nursing Diagnoses: Potential alteration in peripheral tissue perfusion (select prior to confirmation of diagnosis) Goals: Patient/caregiver will verbalize understanding of disease process and disease management Date Initiated: 09/20/2022 Target Resolution Date: 03/25/2023 Goal Status: Active Interventions: Provide education on Management of Neuropathy and Related Ulcers [name], [name] ([id number]) [phone].pdf Page 5 of 9 Treatment Activities: Patient referred for customized  footwear/offloading : 09/20/2022 Notes: Wound/Skin Impairment Nursing Diagnoses: Knowledge deficit related to ulceration/compromised skin integrity Goals: Ulcer/skin breakdown will have a volume reduction of 30% by week 4 Date Initiated: 09/20/2022 Date Inactivated: 11/29/2022 Target Resolution Date: 11/30/2022 Goal Status: Met Ulcer/skin breakdown will have a volume reduction of 50% by week 8 Date Initiated: 11/29/2022 Target Resolution Date: 03/25/2023 Goal Status: Active Interventions: Assess ulceration(s) every visit Treatment Activities: Skin care regimen initiated : 09/20/2022 Topical wound management initiated : 09/20/2022 Notes: Electronic Signature(s) Signed: 01/10/2023 5:25:22 PM By: [name] Entered By: Sherry East on 01/10/2023 11:09:08 -------------------------------------------------------------------------------- Pain Assessment Details Patient Name: Date of Service: [name]. 01/10/2023 10:30 A M Medical Record Number: [id number] Patient Account Number: [id number] Date of Birth/Sex: Treating [name]: [birth year] (77 y.o. [name], [name] Primary Care Cas Tracz: Marcy Panning Other Clinician: Referring Raynold Blankenbaker: Treating Shaylene Paganelli/Extender: [name], [name] Weeks in Treatment: 16 Active Problems Location of Pain Severity and Description of Pain Patient Has Paino No Site Locations With Dressing Change: Yes Rate the pain. Current Pain Level: 0 Worst Pain Level: 6 Character of Pain Describe the Pain: Burning, Throbbing Pain Management and Medication Current Pain Management: ELLYN, RUBIANO ([id number]) [phone].pdf Page 6 of 9 Electronic Signature(s) Signed: 01/10/2023 5:25:22 PM By: [name] Entered By: Sherry East on 01/10/2023 10:54:11 -------------------------------------------------------------------------------- Patient/Caregiver Education Details Patient Name: Date of Service: Alroy Dust 1/17/2024andnbsp10:30 A M Medical Record Number: [id number] Patient Account Number: [id number] Date of Birth/Gender: Treating [name]: [birth year] (77 y.o. Marta Lamas Primary Care Physician: [name] [name] Other Clinician: Referring Physician: Treating Physician/Extender: [name], [name] Weeks in Treatment: 16 Education Assessment Education Provided To: Patient Education Topics Provided Wound Debridement: Methods: Explain/Verbal Responses: Reinforcements needed Wound/Skin Impairment: Methods: Explain/Verbal Responses: Reinforcements needed, State content correctly Electronic Signature(s) Signed: 01/10/2023 5:25:22 PM By: [name] Entered By: Sherry East on 01/10/2023 11:09:26 -------------------------------------------------------------------------------- Wound Assessment Details Patient Name: Date of Service: [name]. 01/10/2023 10:30 A M Medical Record Number: [id number] Patient Account Number: [id number] Date of Birth/Sex: Treating [name]: Jul 29, 1946 (77 y.o. [name], [name] Primary Care Edvardo Honse: Marcy Panning Other Clinician: Referring Saifan Rayford: Treating Janaiyah Blackard/Extender: [name], [name] Weeks in Treatment: 16 Wound Status Wound Number: 1 Primary Lymphedema Etiology: Wound Location: Right, Dorsal Foot Wound Open Wounding Event: Blister Status: Date Acquired: 08/23/2022 Comorbid Asthma, Chronic Obstructive Pulmonary Disease (COPD), Weeks Of Treatment: 16 History: Congestive Heart Failure, Hypertension, Rheumatoid Arthritis, Clustered Wound: No Neuropathy, Received Chemotherapy Photos [name], [name] ([id number]) [phone].pdf Page 7 of 9 Wound Measurements Length: (cm) 0.4 Width: (cm) 0.3 Depth: (cm) 0.1 Area: (cm) 0.094 Volume: (cm) 0.009 % Reduction in Area: 90.9% % Reduction in Volume: 98.6% Epithelialization: Small (1-33%) Tunneling: No Undermining:  No Wound Description Classification: Full Thickness Without Exposed Support Structures Wound Margin: Distinct, outline attached Exudate Amount: Medium Exudate Type: Serosanguineous Exudate Color: red, brown Foul Odor After Cleansing: No Slough/Fibrino Yes Wound Bed Granulation Amount: Large (67-100%) Exposed Structure Granulation Quality: Pink Fascia Exposed: No Necrotic Amount: Small (1-33%) Fat Layer (Subcutaneous Tissue) Exposed: Yes Necrotic  Quality: Adherent Slough Tendon Exposed: No Muscle Exposed: No Joint Exposed: No Bone Exposed: No Periwound Skin Texture Texture Color No Abnormalities Noted: Yes No Abnormalities Noted: No Erythema: No Moisture No Abnormalities Noted: No Temperature / Pain Dry / Scaly: Yes Temperature: No Abnormality Treatment Notes Wound #1 (Foot) Wound Laterality: Dorsal, Right Cleanser Soap and Water Discharge Instruction: May shower and wash wound with dial antibacterial soap and water prior to dressing change. Wound Cleanser Discharge Instruction: Cleanse the wound with wound cleanser prior to applying a clean dressing using gauze sponges, not tissue or cotton balls. Peri-Wound Care Topical Primary Dressing Sorbalgon AG Dressing 2x2 (in/in) Discharge Instruction: Apply to wound bed as instructed Secondary Dressing Woven Gauze Sponge, Non-Sterile 4x4 in Discharge Instruction: Apply over primary dressing as directed. Zetuvit Plus Silicone Border Dressing 4x4 (in/in) Discharge Instruction: Apply silicone border over primary dressing as directed. Secured With Compression Wrap Compression Stockings Add-Ons [name], [name] ([id number]) [phone].pdf Page 8 of 9 Electronic Signature(s) Signed: 01/10/2023 5:25:22 PM By: [name] Entered By: Sherry East on 01/10/2023 10:55:43 -------------------------------------------------------------------------------- Wound Assessment Details Patient Name: Date of  Service: [name], Sherry RO N [name]. 01/10/2023 10:30 A M Medical Record Number: [id number] Patient Account Number: [id number] Date of Birth/Sex: Treating [name]: Dec 22, 1946 (77 y.o. F) [name], [name] Primary Care Orrie Schubert: [name] [name] Other Clinician: Referring Taichi Repka: Treating Lilliam Chamblee/Extender: [name], [name] Weeks in Treatment: 16 Wound Status Wound Number: 3 Primary Trauma, Other Etiology: Wound Location: Right, Medial Lower Leg Wound Open Wounding Event: Laceration Status: Date Acquired: 12/18/2022 Comorbid Asthma, Chronic Obstructive Pulmonary Disease (COPD), Weeks Of Treatment: 1 History: Congestive Heart Failure, Hypertension, Rheumatoid Arthritis, Clustered Wound: No Neuropathy, Received Chemotherapy Photos Wound Measurements Length: (cm) 2.8 Width: (cm) 2 Depth: (cm) 0.1 Area: (cm) 4.398 Volume: (cm) 0.44 % Reduction in Area: 59.3% % Reduction in Volume: 59.3% Epithelialization: Small (1-33%) Tunneling: No Undermining: No Wound Description Classification: Full Thickness Without Exposed Suppor Exudate Amount: Medium Exudate Type: Serosanguineous Exudate Color: red, brown t Structures Foul Odor After Cleansing: No Slough/Fibrino Yes Wound Bed Granulation Amount: Small (1-33%) Exposed Structure Granulation Quality: Red Fascia Exposed: No Necrotic Amount: Large (67-100%) Fat Layer (Subcutaneous Tissue) Exposed: Yes Necrotic Quality: Eschar, Adherent Slough Tendon Exposed: No Muscle Exposed: No Joint Exposed: No Bone Exposed: No Periwound Skin Texture Texture Color No Abnormalities Noted: Yes No Abnormalities Noted: Yes Moisture Temperature / Pain No Abnormalities Noted: Yes Temperature: No Abnormality Treatment Notes [name], [name] ([id number]) [phone].pdf Page 9 of 9 Wound #3 (Lower Leg) Wound Laterality: Right, Medial Cleanser Soap and Water Discharge Instruction: May shower and wash wound with dial  antibacterial soap and water prior to dressing change. Wound Cleanser Discharge Instruction: Cleanse the wound with wound cleanser prior to applying a clean dressing using gauze sponges, not tissue or cotton balls. Peri-Wound Care Topical Primary Dressing Sorbalgon AG Dressing 2x2 (in/in) Discharge Instruction: Apply to wound bed as instructed Secondary Dressing ABD Pad, 8x10 Discharge Instruction: Apply over primary dressing as directed. Secured With Elastic Bandage 4 inch (ACE bandage) Discharge Instruction: Secure with ACE bandage as directed. Kerlix Roll Sterile, 4.5x3.1 (in/yd) Discharge Instruction: Secure with Kerlix as directed. Compression Wrap Compression Stockings Add-Ons Electronic Signature(s) Signed: 01/10/2023 5:25:22 PM By: [name] Entered By: Sherry East on 01/10/2023 10:57:11 -------------------------------------------------------------------------------- Vitals Details Patient Name: Date of Service: [name]. 01/10/2023 10:30 A M Medical Record Number: [id number] Patient Account Number: [id number] Date of Birth/Sex: Treating [name]: 1946/12/07 (77 y.o. F) [name], [name] Primary  Care Lasha Echeverria: [name] [name] Other Clinician: Referring Ival Pacer: Treating Raelie Lohr/Extender: [name], [name] Weeks in Treatment: 16 Vital Signs Time Taken: 10:53 Temperature (F): 98.4 Height (in): 64 Pulse (bpm): 79 Weight (lbs): 111 Respiratory Rate (breaths/min): 18 Body Mass Index (BMI): 19.1 Blood Pressure (mmHg): 127/72 Reference Range: 80 - 120 mg / dl Electronic Signature(s) Signed: 01/10/2023 5:25:22 PM By: [name] Entered By: Sherry East on 01/10/2023 10:53:52

## 2023-01-18 ENCOUNTER — Encounter (HOSPITAL_BASED_OUTPATIENT_CLINIC_OR_DEPARTMENT_OTHER): Payer: Medicare Other | Admitting: General Surgery

## 2023-01-18 DIAGNOSIS — S91311D Laceration without foreign body, right foot, subsequent encounter: Secondary | ICD-10-CM | POA: Diagnosis not present

## 2023-01-18 DIAGNOSIS — L97812 Non-pressure chronic ulcer of other part of right lower leg with fat layer exposed: Secondary | ICD-10-CM | POA: Diagnosis not present

## 2023-01-18 DIAGNOSIS — S81801A Unspecified open wound, right lower leg, initial encounter: Secondary | ICD-10-CM | POA: Diagnosis not present

## 2023-01-18 DIAGNOSIS — L97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 evidence of necrosis: Secondary | ICD-10-CM | POA: Diagnosis not present

## 2023-01-18 DIAGNOSIS — I890 Lymphedema, not elsewhere classified: Secondary | ICD-10-CM | POA: Diagnosis not present

## 2023-01-18 DIAGNOSIS — G629 Polyneuropathy, unspecified: Secondary | ICD-10-CM | POA: Diagnosis not present

## 2023-01-18 DIAGNOSIS — S9031XA Contusion of right foot, initial encounter: Secondary | ICD-10-CM | POA: Diagnosis not present

## 2023-01-18 NOTE — Progress Notes (Signed)
DENNI, [place] ([id number]) [id number] Page 1 of 9 Visit Report for 01/18/2023 Arrival Information Details Patient Name: Date of Service: [name], [name] 01/18/2023 3:00 PM Medical Record Number: [id number] Patient Account Number: [id number] Date of Birth/Sex: Treating RN: 1945/12/28 (76 y.o. F) Zochol, [name] Primary Care Sevin Langenbach: Judie Bonus INE Other Clinician: Referring Clotilde Loth: Treating Elfego Giammarino/Extender: Lacie Draft, ELA INE Weeks in Treatment: 36 Visit Information History Since Last Visit Added or deleted any medications: No Patient Arrived: Wheel Chair Any new allergies or adverse reactions: No Arrival Time: 15:15 Had a fall or experienced change in No Accompanied By: Spouse activities of daily living that may affect Transfer Assistance: Manual risk of falls: Patient Identification Verified: Yes Signs or symptoms of abuse/neglect since last visito No Secondary Verification Process Completed: Yes Hospitalized since last visit: No Patient Requires Transmission-Based Precautions: No Implantable device outside of the clinic excluding No Patient Has Alerts: No cellular tissue based products placed in the center since last visit: Has Compression in Place as Prescribed: Yes Pain Present Now: No Electronic Signature(s) Signed: 01/18/2023 4:08:23 PM By: Blanche East RN Entered By: Blanche East on 01/18/2023 15:15:28 -------------------------------------------------------------------------------- Encounter Discharge Information Details Patient Name: Date of Service: [name], [place]. 01/18/2023 3:00 PM Medical Record Number: [id number] Patient Account Number: [id number] Date of Birth/Sex: Treating RN: [birth year] (77 y.o. [name], [place] Primary Care Naisha Wisdom: Marcy Panning Other Clinician: Referring [place]: Treating Trevonn Hallum/Extender: Lacie Draft, ELA INE Weeks in Treatment: 17 Encounter Discharge  Information Items Post Procedure Vitals Discharge Condition: Stable Temperature (F): 98.2 Ambulatory Status: Wheelchair Pulse (bpm): 73 Discharge Destination: Home Respiratory Rate (breaths/min): 18 Transportation: Private Auto Blood Pressure (mmHg): 122/75 Accompanied By: spouse Schedule Follow-up Appointment: Yes Clinical Summary of Care: Electronic Signature(s) Signed: 01/18/2023 3:51:21 PM By: Blanche East RN Entered By: Blanche East on 01/18/2023 15:51:21 [place], [name] ([id number]) [id number] Page 2 of 9 -------------------------------------------------------------------------------- Lower Extremity Assessment Details Patient Name: Date of Service: [name], [name] 01/18/2023 3:00 PM Medical Record Number: [id number] Patient Account Number: [id number] Date of Birth/Sex: Treating RN: 1946/04/26 (77 y.o. [name], [place] Primary Care Phinehas Grounds: Marcy Panning Other Clinician: Referring Trampus Mcquerry: Treating Jaliyah Fotheringham/Extender: Lacie Draft, ELA INE Weeks in Treatment: 17 Edema Assessment Assessed: [Left: No] [Right: No] Edema: [Left: N] [Right: o] Calf Left: Right: Point of Measurement: From Medial Instep 26.8 cm Ankle Left: Right: Point of Measurement: From Medial Instep 22 cm Vascular Assessment Pulses: Dorsalis Pedis Palpable: [Right:Yes] Electronic Signature(s) Signed: 01/18/2023 4:01:10 PM By: Dellie Catholic RN Signed: 01/18/2023 4:08:23 PM By: Blanche East RN Entered By: Dellie Catholic on 01/18/2023 15:20:00 -------------------------------------------------------------------------------- Multi Wound Chart Details Patient Name: Date of Service: [name], Sherry [place]. 01/18/2023 3:00 PM Medical Record Number: [id number] Patient Account Number: [id number] Date of Birth/Sex: Treating RN: 1946/04/21 (77 y.o. F) Primary Care Darnell Stimson: Judie Bonus INE Other Clinician: Referring Ollen Rao: Treating Paton Crum/Extender:  Lacie Draft, ELA INE Weeks in Treatment: 17 Vital Signs Height(in): 64 Pulse(bpm): 73 Weight(lbs): 111 Blood Pressure(mmHg): 122/75 Body Mass Index(BMI): 19.1 Temperature(F): 98.2 Respiratory Rate(breaths/min): 18 [1:Photos:] [N/A:N/A [id number] Page 3 of 9] Right, Dorsal Foot Right, Medial Lower Leg N/A Wound Location: Blister Laceration N/A Wounding Event: Lymphedema Trauma, Other N/A Primary Etiology: Asthma, Chronic Obstructive Asthma, Chronic Obstructive N/A Comorbid History: Pulmonary Disease (COPD), Pulmonary Disease (COPD), Congestive Heart Failure, Congestive Heart Failure, Hypertension, Rheumatoid Arthritis, Hypertension, Rheumatoid Arthritis, Neuropathy, Received Chemotherapy Neuropathy, Received Chemotherapy 08/23/2022 12/18/2022 N/A Date Acquired: 17 3 N/A Weeks of Treatment:  Open Open N/A Wound Status: No No N/A Wound Recurrence: 0.4x0.4x0.1 2.3x1.7x0.1 N/A Measurements L x W x D (cm) 0.126 3.071 N/A A (cm) : rea 0.013 0.307 N/A Volume (cm) : 87.80% 71.60% N/A % Reduction in A rea: 97.90% 71.60% N/A % Reduction in Volume: Full Thickness Without Exposed Full Thickness Without Exposed N/A Classification: Support Structures Support Structures Medium Medium N/A Exudate A mount: Serosanguineous Serosanguineous N/A Exudate Type: red, brown red, brown N/A Exudate Color: Distinct, outline attached N/A N/A Wound Margin: Large (67-100%) Small (1-33%) N/A Granulation A mount: Pink Red N/A Granulation Quality: Small (1-33%) Large (67-100%) N/A Necrotic A mount: Adherent Slough Eschar, Adherent Slough N/A Necrotic Tissue: Fat Layer (Subcutaneous Tissue): Yes Fat Layer (Subcutaneous Tissue): Yes N/A Exposed Structures: Fascia: No Fascia: No Tendon: No Tendon: No Muscle: No Muscle: No Joint: No Joint: No Bone: No Bone: No Small (1-33%) Small (1-33%) N/A Epithelialization: Debridement - Selective/Open  Wound Debridement - Selective/Open Wound N/A Debridement: Pre-procedure Verification/Time Out 15:28 15:28 N/A Taken: Lidocaine 5% topical ointment Lidocaine 5% topical ointment N/A Pain Control: [employer] N/A Tissue Debrided: Non-Viable Tissue Non-Viable Tissue N/A Level: 0.16 3.91 N/A Debridement A (sq cm): rea Curette Curette N/A Instrument: Minimum Minimum N/A Bleeding: Pressure Pressure N/A Hemostasis A chieved: Procedure was tolerated well Procedure was tolerated well N/A Debridement Treatment Response: 0.4x0.4x0.1 2.3x1.7x0.1 N/A Post Debridement Measurements L x W x D (cm) 0.013 0.307 N/A Post Debridement Volume: (cm) No Abnormalities Noted No Abnormalities Noted N/A Periwound Skin Texture: Dry/Scaly: Yes No Abnormalities Noted N/A Periwound Skin Moisture: Erythema: No No Abnormalities Noted N/A Periwound Skin Color: No Abnormality No Abnormality N/A Temperature: Debridement Debridement N/A Procedures Performed: Treatment Notes Electronic Signature(s) Signed: 01/18/2023 3:37:59 PM By: Fredirick Maudlin MD FACS Entered By: Fredirick Maudlin on 01/18/2023 15:37:59 -------------------------------------------------------------------------------- Multi-Disciplinary Care Plan Details Patient Name: Date of Service: [name], [place]. 01/18/2023 3:00 PM Medical Record Number: [id number] Patient Account Number: [id number] Date of Birth/Sex: Treating RN: 02-26-1946 (77 y.o. [name] Primary Care Eiley Mcginnity: Marcy Panning Other Clinician: Referring Mabry Santarelli: Treating Gage Treiber/Extender: Lacie Draft, ELA INE Weeks in Treatment: [address] [place] [name] ([id number]) [id number] Page 4 of 9 Peripheral Neuropathy Nursing Diagnoses: Potential alteration in peripheral tissue perfusion (select prior to confirmation of diagnosis) Goals: Patient/caregiver will verbalize understanding of disease process and  disease management Date Initiated: 09/20/2022 Target Resolution Date: 03/25/2023 Goal Status: Active Interventions: Provide education on Management of Neuropathy and Related Ulcers Treatment Activities: Patient referred for customized footwear/offloading : 09/20/2022 Notes: Wound/Skin Impairment Nursing Diagnoses: Knowledge deficit related to ulceration/compromised skin integrity Goals: Ulcer/skin breakdown will have a volume reduction of 30% by week 4 Date Initiated: 09/20/2022 Date Inactivated: 11/29/2022 Target Resolution Date: 11/30/2022 Goal Status: Met Ulcer/skin breakdown will have a volume reduction of 50% by week 8 Date Initiated: 11/29/2022 Target Resolution Date: 03/25/2023 Goal Status: Active Interventions: Assess ulceration(s) every visit Treatment Activities: Skin care regimen initiated : 09/20/2022 Topical wound management initiated : 09/20/2022 Notes: Electronic Signature(s) Signed: 01/18/2023 4:01:10 PM By: Dellie Catholic RN Entered By: Dellie Catholic on 01/18/2023 15:24:33 -------------------------------------------------------------------------------- Pain Assessment Details Patient Name: Date of Service: [name], [place]. 01/18/2023 3:00 PM Medical Record Number: [id number] Patient Account Number: [id number] Date of Birth/Sex: Treating RN: 28-Dec-1945 (77 y.o. [name] Primary Care Kaitrin Seybold: Marcy Panning Other Clinician: Referring Alejandra Hunt: Treating Destiny Hagin/Extender: Lacie Draft, ELA INE Weeks in Treatment: 17 Active Problems Location of Pain Severity and Description of Pain Patient Has Paino  Yes Site Locations Pain LocationSAMREEN, SELTZER ([id number]) [id number] Page 5 of 9 Pain Location: Generalized Pain With Dressing Change: No Duration of the Pain. Constant / Intermittento Constant Rate the pain. Current Pain Level: 2 Worst Pain Level: 10 Least Pain Level: 2 Tolerable Pain Level:  2 Character of Pain Describe the Pain: Difficult to Pinpoint Pain Management and Medication Current Pain Management: Medication: Yes Cold Application: No Rest: Yes Massage: No Activity: No T.E.N.S.: No Heat Application: No Leg drop or elevation: No Is the Current Pain Management Adequate: Adequate How does your wound impact your activities of daily livingo Sleep: No Bathing: No Appetite: No Relationship With Others: No Bladder Continence: No Emotions: No Bowel Continence: No Work: No Toileting: No Drive: No Dressing: No Hobbies: No Electronic Signature(s) Signed: 01/18/2023 4:01:10 PM By: Dellie Catholic RN Entered By: Dellie Catholic on 01/18/2023 15:19:53 -------------------------------------------------------------------------------- Patient/Caregiver Education Details Patient Name: Date of Service: [name] 1/25/2024andnbsp3:00 PM Medical Record Number: [id number] Patient Account Number: [id number] Date of Birth/Gender: Treating RN: [birth year] (77 y.o. [name] Primary Care Physician: Judie Bonus INE Other Clinician: Referring Physician: Treating Physician/Extender: Lacie Draft, ELA INE Weeks in Treatment: 17 Education Assessment Education Provided To: Patient Education Topics Provided Wound/Skin Impairment: Methods: Explain/Verbal Responses: Return demonstration correctly Electronic Signature(s) Signed: 01/18/2023 4:01:10 PM By: Dellie Catholic RN [name], [name] ([id number]) [id number].pdf Page 6 of 9 Entered By: Dellie Catholic on 01/18/2023 15:24:47 -------------------------------------------------------------------------------- Wound Assessment Details Patient Name: Date of Service: [name], [name] 01/18/2023 3:00 PM Medical Record Number: [id number] Patient Account Number: [id number] Date of Birth/Sex: Treating RN: [birth year] (77 y.o. F) Zochol, [name] Primary Care Demetrice Combes: Judie Bonus  INE Other Clinician: Referring Shraddha Lebron: Treating Orson Rho/Extender: Lacie Draft, ELA INE Weeks in Treatment: 17 Wound Status Wound Number: 1 Primary Lymphedema Etiology: Wound Location: Right, Dorsal Foot Wound Open Wounding Event: Blister Status: Date Acquired: 08/23/2022 Comorbid Asthma, Chronic Obstructive Pulmonary Disease (COPD), Weeks Of Treatment: 17 History: Congestive Heart Failure, Hypertension, Rheumatoid Arthritis, Clustered Wound: No Neuropathy, Received Chemotherapy Photos Wound Measurements Length: (cm) 0.4 Width: (cm) 0.4 Depth: (cm) 0.1 Area: (cm) 0.126 Volume: (cm) 0.013 % Reduction in Area: 87.8% % Reduction in Volume: 97.9% Epithelialization: Small (1-33%) Tunneling: No Undermining: No Wound Description Classification: Full Thickness Without Exposed Suppor Wound Margin: Distinct, outline attached Exudate Amount: Medium Exudate Type: Serosanguineous Exudate Color: red, brown t Structures Foul Odor After Cleansing: No Slough/Fibrino Yes Wound Bed Granulation Amount: Large (67-100%) Exposed Structure Granulation Quality: Pink Fascia Exposed: No Necrotic Amount: Small (1-33%) Fat Layer (Subcutaneous Tissue) Exposed: Yes Necrotic Quality: Adherent Slough Tendon Exposed: No Muscle Exposed: No Joint Exposed: No Bone Exposed: No Periwound Skin Texture Texture Color No Abnormalities Noted: Yes No Abnormalities Noted: No Erythema: No Moisture No Abnormalities Noted: No Temperature / Pain Dry / Scaly: Yes Temperature: No Abnormality Treatment Notes [name], [name] ([id number]) [phone].pdf Page 7 of 9 Wound #1 (Foot) Wound Laterality: Dorsal, Right Cleanser Soap and Water Discharge Instruction: May shower and wash wound with dial antibacterial soap and water prior to dressing change. Wound Cleanser Discharge Instruction: Cleanse the wound with wound cleanser prior to applying a clean dressing using gauze  sponges, not tissue or cotton balls. Peri-Wound Care Topical Primary Dressing Promogran Prisma Matrix, 4.34 (sq in) (silver collagen) Discharge Instruction: Moisten collagen with saline or hydrogel Secondary Dressing Woven Gauze Sponge, Non-Sterile 4x4 in Discharge Instruction: Apply over primary dressing as directed. Zetuvit Plus Silicone Border Dressing 4x4 (in/in) Discharge Instruction: Apply  silicone border over primary dressing as directed. Secured With Compression Wrap Compression Stockings Environmental education officer) Signed: 01/18/2023 4:08:23 PM By: Blanche East RN Entered By: Blanche East on 01/18/2023 15:17:06 -------------------------------------------------------------------------------- Wound Assessment Details Patient Name: Date of Service: [name], [place]. 01/18/2023 3:00 PM Medical Record Number: [id number] Patient Account Number: [id number] Date of Birth/Sex: Treating RN: October 20, 1946 (77 y.o. F) Zochol, [name] Primary Care Taylen Wendland: Judie Bonus INE Other Clinician: Referring Ova Gillentine: Treating Oneda Duffett/Extender: Lacie Draft, ELA INE Weeks in Treatment: 17 Wound Status Wound Number: 3 Primary Trauma, Other Etiology: Wound Location: Right, Medial Lower Leg Wound Open Wounding Event: Laceration Status: Date Acquired: 12/18/2022 Comorbid Asthma, Chronic Obstructive Pulmonary Disease (COPD), Weeks Of Treatment: 3 History: Congestive Heart Failure, Hypertension, Rheumatoid Arthritis, Clustered Wound: No Neuropathy, Received Chemotherapy Photos Wound Measurements Length: (cm) 2.3 [name], [name] ([id number]) Width: (cm) 1.7 Depth: (cm) 0.1 Area: (cm) 3.071 Volume: (cm) 0.307 % Reduction in Area: 71.6% [id number] Page 8 of 9 % Reduction in Volume: 71.6% Epithelialization: Small (1-33%) Tunneling: No Undermining: No Wound Description Classification: Full Thickness Without Exposed Support  Structures Exudate Amount: Medium Exudate Type: Serosanguineous Exudate Color: red, brown Foul Odor After Cleansing: No Slough/Fibrino Yes Wound Bed Granulation Amount: Small (1-33%) Exposed Structure Granulation Quality: Red Fascia Exposed: No Necrotic Amount: Large (67-100%) Fat Layer (Subcutaneous Tissue) Exposed: Yes Necrotic Quality: Eschar, Adherent Slough Tendon Exposed: No Muscle Exposed: No Joint Exposed: No Bone Exposed: No Periwound Skin Texture Texture Color No Abnormalities Noted: Yes No Abnormalities Noted: Yes Moisture Temperature / Pain No Abnormalities Noted: Yes Temperature: No Abnormality Treatment Notes Wound #3 (Lower Leg) Wound Laterality: Right, Medial Cleanser Soap and Water Discharge Instruction: May shower and wash wound with dial antibacterial soap and water prior to dressing change. Wound Cleanser Discharge Instruction: Cleanse the wound with wound cleanser prior to applying a clean dressing using gauze sponges, not tissue or cotton balls. Peri-Wound Care Topical Primary Dressing Sorbalgon AG Dressing 2x2 (in/in) Discharge Instruction: Apply to wound bed as instructed Secondary Dressing ABD Pad, 8x10 Discharge Instruction: Apply over primary dressing as directed. Secured With Elastic Bandage 4 inch (ACE bandage) Discharge Instruction: Secure with ACE bandage as directed. Kerlix Roll Sterile, 4.5x3.1 (in/yd) Discharge Instruction: Secure with Kerlix as directed. Compression Wrap Compression Stockings Add-Ons Electronic Signature(s) Signed: 01/18/2023 4:08:23 PM By: Blanche East RN Entered By: Blanche East on 01/18/2023 15:17:48 Vitals Details -------------------------------------------------------------------------------- [name] ([id number]) [id number] Page 9 of 9 Patient Name: Date of Service: [name], [place] 01/18/2023 3:00 PM Medical Record Number: [id number] Patient Account Number:  [id number] Date of Birth/Sex: Treating RN: 05/15/1946 (77 y.o. [name] Primary Care Stella Bortle: Judie Bonus INE Other Clinician: Referring Itzamar Traynor: Treating Boleslaus Holloway/Extender: Lacie Draft, ELA INE Weeks in Treatment: 17 Vital Signs Time Taken: 15:14 Temperature (F): 98.2 Height (in): 64 Pulse (bpm): 73 Weight (lbs): 111 Respiratory Rate (breaths/min): 18 Body Mass Index (BMI): 19.1 Blood Pressure (mmHg): 122/75 Reference Range: 80 - 120 mg / dl Electronic Signature(s) Signed: 01/18/2023 4:01:10 PM By: Dellie Catholic RN Entered By: Dellie Catholic on 01/18/2023 15:19:07

## 2023-01-19 ENCOUNTER — Telehealth: Payer: Self-pay | Admitting: Hematology

## 2023-01-19 NOTE — Progress Notes (Signed)
[name] ([id number]) [id number] Page 1 of 11 Visit Report for 01/18/2023 Chief Complaint Document Details Patient Name: Date of Service: [name], [name] 01/18/2023 3:00 PM Medical Record Number: [id number] Patient Account Number: [id number] Date of Birth/Sex: Treating RN: June 29, 1946 (77 y.o. F) Primary Care Provider: Marcy [name] Other Clinician: Referring Provider: Treating Provider/Extender: [name], ELA [name] Weeks in Treatment: 17 Information Obtained from: Patient Chief Complaint Patient seen for complaints of Non-Healing Wounds. Electronic Signature(s) Signed: 01/18/2023 3:40:28 PM By: Sherry Maudlin MD FACS Entered By: [name] on 01/18/2023 15:40:28 -------------------------------------------------------------------------------- Debridement Details Patient Name: Date of Service: [name]. 01/18/2023 3:00 PM Medical Record Number: [id number] Patient Account Number: [id number] Date of Birth/Sex: Treating RN: 10-04-1946 (77 y.o. [name] Primary Care Provider: Judie Bonus [name] Other Clinician: Referring Provider: Treating Provider/Extender: [name], ELA [name] Weeks in Treatment: 17 Debridement Performed for Assessment: Wound #1 Right,Dorsal Foot Performed By: Physician Sherry Maudlin, MD Debridement Type: Debridement Level of Consciousness (Pre-procedure): Awake and Alert Pre-procedure Verification/Time Out Yes - 15:28 Taken: Start Time: 15:29 Pain Control: Lidocaine 5% topical ointment T Area Debrided (L x W): otal 0.4 (cm) x 0.4 (cm) = 0.16 (cm) Tissue and other material debrided: Non-Viable, Slough, Slough Level: Non-Viable Tissue Debridement Description: Selective/Open Wound Instrument: Curette Bleeding: Minimum Hemostasis Achieved: Pressure Response to Treatment: Procedure was tolerated well Level of Consciousness (Post- Responds to Painful  Stimuli procedure): Post Debridement Measurements of Total Wound Length: (cm) 0.4 Width: (cm) 0.4 Depth: (cm) 0.1 Volume: (cm) 0.013 Character of Wound/Ulcer Post Debridement: Requires Further Debridement Post Procedure Diagnosis Same as Pre-procedure [name], [name] ([id number]) [phone].pdf Page 2 of 11 Notes Scribed for Dr. Celine [name] by Sherry East, RN Electronic Signature(s) Signed: 01/18/2023 4:01:10 PM By: Sherry Catholic RN Signed: 01/19/2023 7:39:53 AM By: Sherry Maudlin MD FACS Entered By: [name] on 01/18/2023 15:30:01 -------------------------------------------------------------------------------- Debridement Details Patient Name: Date of Service: [name]. 01/18/2023 3:00 PM Medical Record Number: [id number] Patient Account Number: [id number] Date of Birth/Sex: Treating RN: [birth year] (77 y.o. [name] Primary Care Provider: Judie Bonus [name] Other Clinician: Referring Provider: Treating Provider/Extender: [name], ELA [name] Weeks in Treatment: 17 Debridement Performed for Assessment: Wound #3 Right,Medial Lower Leg Performed By: Physician Sherry Maudlin, MD Debridement Type: Debridement Level of Consciousness (Pre-procedure): Awake and Alert Pre-procedure Verification/Time Out Yes - 15:28 Taken: Start Time: 15:29 Pain Control: Lidocaine 5% topical ointment T Area Debrided (L x W): otal 2.3 (cm) x 1.7 (cm) = 3.91 (cm) Tissue and other material debrided: Non-Viable, Slough, Slough Level: Non-Viable Tissue Debridement Description: Selective/Open Wound Instrument: Curette Bleeding: Minimum Hemostasis Achieved: Pressure Response to Treatment: Procedure was tolerated well Level of Consciousness (Post- Responds to Painful Stimuli procedure): Post Debridement Measurements of Total Wound Length: (cm) 2.3 Width: (cm) 1.7 Depth: (cm) 0.1 Volume: (cm) 0.307 Character of Wound/Ulcer Post  Debridement: Requires Further Debridement Post Procedure Diagnosis Same as Pre-procedure Notes Scribed for Dr. Celine [name] by Sherry East, RN Electronic Signature(s) Signed: 01/18/2023 4:01:10 PM By: Sherry Catholic RN Signed: 01/19/2023 7:39:53 AM By: Sherry Maudlin MD FACS Entered By: [name] on 01/18/2023 15:31:25 -------------------------------------------------------------------------------- HPI Details Patient Name: Date of Service: [name]. 01/18/2023 3:00 PM Medical Record Number: [id number] Patient Account Number: [id number] Date of Birth/Sex: Treating RN: [birth year] (77 y.o. F) Primary Care Provider: Marcy [name] Other Clinician: [name], [name] ([id number]) [id number] Page 3 of 11 Referring Provider: Treating Provider/Extender: [name],  ELA [name] Weeks in Treatment: 17 History of Present Illness HPI Description: ADMISSION 09/20/2022 This is a 77 year old woman with a past medical history significant for congestive heart failure, atrial fibrillation, rheumatoid arthritis on long-term steroid treatment, CMML currently receiving chemotherapy and moderate malnutrition. She presents to clinic today with a wound on the dorsum of her right foot. She says it started out as a bruise and then developed a bump which subsequently broke open. She says she was sent to our clinic by her oncologist. She is not diabetic and does not smoke. ABI in clinic today was 1.55. She has had formal segmental arterial Dopplers done, a little over a year ago, which did not show any evidence of occlusive vascular disease. On the dorsal aspect of the right foot, there is a wound with a lot of old hematoma, slough, eschar, and and nonviable subcutaneous tissue. T the best of my o ability to discern, it does appear to involve the muscle layer, but there is no evidence of necrosis. The wound is pouring serous fluid; the patient has 3+  pitting edema to the bilateral lower extremities. 09/29/2022: The patient came to clinic today with nothing but a T overlying her wound. She is accompanied by her husband today. She has not elfa communicated with her cardiologist regarding her fluid overload. She still has 3+ pitting edema bilaterally. The wound is still pouring serous fluid, although not quite as effusively as at her initial visit. There is still quite a bit of nonviable tissue and hematoma present. 10/13; patient's wound on the right lateral lower leg his eschared over and may well be on its way to healing. The real problem here is on the right dorsal foot just proximal to her toes. The patient is still puzzled by the cause of this. Looking at a picture on the patient's smart phone there is a suggestion that this might have been a hematoma which seems to fit what Dr. Celine [name] had felt on her initial evaluation. They have been using iodoform packing kerlix and Coban. 10/20; right lower leg is healed. We are approved for snap VAC on the right foot. We will try to arrange home health but until then she is going to need a nurse visit on Tuesday 10/20/2022: It has been 2 weeks since I have seen the wound. It has improved significantly since then. She is currently in a snap VAC and I think this has made quite a difference. The overall wound measurements are smaller. There is no massive drainage, as there had been previously. There is some good granulation tissue forming underneath a bit of slough. 10/30/2022: The wound continues to improve with use of the snap VAC. The swelling in her foot is better. The undermining continues to close in. There is a layer of slough overlying good granulation tissue. 11/06/2022: The wound has contracted further. The undermining is still present but the wound surface is better and her edema control is good. 11/13/2022: The wound is smaller again and the undermining has decreased as well. There is slough  accumulation over a nice bed of granulation tissue. 11/20/2022: We did not have any snap VAC devices in clinic last week so the wound was just packed. It did well and is smaller with less undermining today. There is still slough present on the surface. 11/29/2022: The wound has contracted considerably and the undermining has resolved. There is still some slough on the wound surface. 12/13; slough on the surface of the wound required debridement however afterwards  the granulation looks healthy the wound has been contracting. She has been using a snap VAC with collagen 12/13/2022: The wound was too small and superficial last week and the snap VAC was discontinued. They have been using Hydrofera Blue due to the hypertrophic granulation tissue. The wound has a little bit of slough on the surface. 12/28/2022: The wound on her dorsal foot continues to contract and is quite superficial at this point. There is a little bit of slough on the surface. Unfortunately, she struck her right medial leg on a hard surface on Christmas, resulting in a laceration. It was sutured in the emergency room. She just came from having the sutures removed. Overall, I do not think the skin is likely to survive, but for now there is no grossly open ulcer at this site. 01/03/2023: Her edema is fairly significant today and not at all well-controlled. The dorsal foot wound appears deeper, but this may be an illusion secondary to the swelling. There is a fair amount of slough accumulation in the wound. The medial leg wound, as expected, shows signs of skin necrosis and partial dehiscence. There is hematoma and necrotic fat visible. 01/10/2023: She removed her wrap about 4 days ago; despite this, both wounds are smaller and cleaner. There is still slough accumulation in both sites. 01/18/2023: She came in with her wrap on today but once it was removed, her leg began to visibly swell; it was quite an interesting phenomenon to observe.  I suspect that she has poor oncotic pressure due to her low albumin. As result, her edema control is suboptimal. Both wounds are smaller with just a little bit of slough present. Electronic Signature(s) Signed: 01/18/2023 3:45:02 PM By: Sherry Maudlin MD FACS Entered By: [name] on 01/18/2023 15:45:02 -------------------------------------------------------------------------------- Physical Exam Details Patient Name: Date of Service: [name]. 01/18/2023 3:00 PM Medical Record Number: [id number] Patient Account Number: [id number] Date of Birth/Sex: Treating RN: 1946/06/13 (77 y.o. F) Primary Care Provider: Marcy [name] Other Clinician: Referring Provider: Treating Provider/Extender: [name], ELA [name] Weeks in Treatment: [address] [place] ([id number]) [id number] Page 4 of 11 Constitutional . . . . no acute distress. Respiratory Normal work of breathing on room air. Notes 01/18/2023: Her edema control is suboptimal. Both wounds are smaller with just a little bit of slough present. Electronic Signature(s) Signed: 01/18/2023 3:45:39 PM By: Sherry Maudlin MD FACS Entered By: [name] on 01/18/2023 15:45:39 -------------------------------------------------------------------------------- Physician Orders Details Patient Name: Date of Service: [name]. 01/18/2023 3:00 PM Medical Record Number: [id number] Patient Account Number: [id number] Date of Birth/Sex: Treating RN: 1946-06-07 (77 y.o. 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 - Dr. Celine [name] Room 2 Return A Bathing/ Shower/ Hygiene Other Bathing/Shower/Hygiene Orders/Instructions: - Please do not get LEG WRAPS on Right leg wet. Sponge bathe or use a cast protector on right leg if showering/bathing. Edema Control - Lymphedema / SCD / Other  Left Lower Extremity Avoid standing for long periods of time. Patient to wear own compression stockings every day. Compression stocking or Garment 20-30 mm/Hg pressure to: - left leg Wound Treatment Wound #1 - Foot Wound Laterality: Dorsal, Right Cleanser: Soap and Water Every Other Day/15 Days Discharge Instructions: May shower and wash wound with dial antibacterial soap and water prior to dressing change. Cleanser: Wound Cleanser (Generic) Every Other Day/15 Days Discharge Instructions: Cleanse the wound with wound cleanser prior to applying a clean dressing using gauze sponges, not tissue or cotton balls. Prim Dressing: Promogran Prisma Matrix, 4.34 (sq in) (silver collagen) Every Other Day/15 Days ary Discharge Instructions: Moisten collagen with saline or hydrogel Secondary Dressing: Woven Gauze Sponge, Non-Sterile 4x4 in (Generic) Every Other Day/15 Days Discharge Instructions: Apply over primary dressing as directed. Secondary Dressing: Zetuvit Plus Silicone Border Dressing 4x4 (in/in) (Generic) Every Other Day/15 Days Discharge Instructions: Apply silicone border over primary dressing as directed. [name], [name] ([id number]) [id number] Page 5 of 11 Wound #3 - Lower Leg Wound Laterality: Right, Medial Cleanser: Soap and Water 1 x Per Week/30  Days Discharge Instructions: May shower and wash wound with dial antibacterial soap and water prior to dressing change. Cleanser: Wound Cleanser 1 x Per Week/30 Days Discharge Instructions: Cleanse the wound with wound cleanser prior to applying a clean dressing using gauze sponges, not tissue or cotton balls. Prim Dressing: Sorbalgon AG Dressing 2x2 (in/in) 1 x Per Week/30 Days ary Discharge Instructions: Apply to wound bed as instructed Secondary Dressing: ABD Pad, 8x10 1 x Per Week/30 Days Discharge Instructions: Apply over primary dressing as directed. Secured With: Elastic Bandage 4 inch (ACE bandage) 1 x Per Week/30 Days Discharge Instructions: Secure with ACE bandage as directed. Secured With: [employer], 4.5x3.1 (in/yd) 1 x Per Week/30 Days Discharge Instructions: Secure with Kerlix as directed. Electronic Signature(s) Signed: 01/19/2023 7:39:53 AM By: Sherry Maudlin MD FACS Entered By: [name] on 01/18/2023 15:45:59 -------------------------------------------------------------------------------- Problem List Details Patient Name: Date of Service: [name]. 01/18/2023 3:00 PM Medical Record Number: [id number] Patient Account Number: [id number] Date of Birth/Sex: Treating RN: 11-19-1946 (77 y.o. 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 09/20/2022 No Yes without evidence of necrosis L97.812 Non-pressure chronic ulcer of other part of right lower leg with fat layer 12/28/2022 No Yes exposed W73.71 Chronic systolic (congestive) heart failure 09/20/2022 No Yes M06.9 Rheumatoid arthritis, unspecified 09/20/2022 No Yes Z79.52 Long term (current) use of systemic steroids  09/20/2022 No Yes I70.90 Unspecified atherosclerosis 09/20/2022 No Yes 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 01/18/2023 3:00 PM Medical Record Number: [id number] Patient Account Number: [id number] Date of Birth/Sex: Treating RN: [birth year] (77 y.o. F) Primary Care Provider: Marcy [name] Other Clinician: Referring Provider: Treating Provider/Extender: [name], ELA [name] Weeks in Treatment: 17 Subjective Chief Complaint Information obtained from Patient Patient  seen for complaints of Non-Healing Wounds. History of Present Illness (HPI) ADMISSION 09/20/2022 This is a 77 year old woman with a past medical history significant for congestive heart failure, atrial fibrillation, rheumatoid arthritis on long-term steroid treatment, CMML currently receiving chemotherapy and moderate malnutrition. She presents to clinic today with a wound on the dorsum of her right foot. She says it started out as a bruise and then developed a bump which subsequently broke open. She says she was sent to our clinic by her oncologist. She is not diabetic and does not smoke. ABI in clinic today was 1.55. She has had formal segmental arterial Dopplers done, a little over a year ago, which did not show any evidence of occlusive vascular disease. On the dorsal aspect of the right foot, there is  a wound with a lot of old hematoma, slough, eschar, and and nonviable subcutaneous tissue. T the best of my o ability to discern, it does appear to involve the muscle layer, but there is no evidence of necrosis. The wound is pouring serous fluid; the patient has 3+ pitting edema to the bilateral lower extremities. 09/29/2022: The patient came to clinic today with nothing but a T overlying her wound. She is accompanied by her husband today. She has not elfa communicated with her cardiologist regarding her fluid overload. She still has 3+ pitting edema bilaterally. The wound is still pouring serous fluid, although not quite as effusively as at her initial visit. There is still quite a bit of nonviable tissue and hematoma present. 10/13; patient's wound on the right lateral lower leg his eschared over and may well be on its way to healing. The real problem here is on the right dorsal foot just proximal to her toes. The patient is still puzzled by the cause of this. Looking at a picture on the patient's smart phone there is a suggestion that this might have been a hematoma which seems to fit what Dr. Celine [name] had felt on her initial evaluation. They have been using iodoform packing kerlix and Coban. 10/20; right lower leg is healed. We are approved for snap VAC on the right foot. We will try to arrange home health but until then she is going to need a nurse visit on Tuesday 10/20/2022: It has been 2 weeks since I have seen the wound. It has improved significantly since then. She is currently in a snap VAC and I think this has made quite a difference. The overall wound measurements are smaller. There is no massive drainage, as there had been previously. There is some good granulation tissue forming underneath a bit of slough. 10/30/2022: The wound continues to improve with use of the snap VAC. The swelling in her foot is better. The undermining continues to close in. There is a layer of slough overlying good  granulation tissue. 11/06/2022: The wound has contracted further. The undermining is still present but the wound surface is better and her edema control is good. 11/13/2022: The wound is smaller again and the undermining has decreased as well. There is slough accumulation over a nice bed of granulation tissue. 11/20/2022: We did not have any snap VAC devices in clinic last week so the wound was just packed. It did well and is smaller with less undermining today. There is still slough present on the surface. 11/29/2022: The wound has contracted considerably and the undermining has resolved. There is still some slough on the wound surface. [name], [name] ([id number]) [id number] Page 7 of 11 12/13; slough on the  surface of the wound required debridement however afterwards the granulation looks healthy the wound has been contracting. She has been using a snap VAC with collagen 12/13/2022: The wound was too small and superficial last week and the snap VAC was discontinued. They have been using Hydrofera Blue due to the hypertrophic granulation tissue. The wound has a little bit of slough on the surface. 12/28/2022: The wound on her dorsal foot continues to contract and is quite superficial at this point. There is a little bit of slough on the surface. Unfortunately, she struck her right medial leg on a hard surface on Christmas, resulting in a laceration. It was sutured in the emergency room. She just came from having the sutures removed. Overall, I do not think the skin is likely to survive, but for now there is no grossly open ulcer at this site. 01/03/2023: Her edema is fairly significant today and not at all well-controlled. The dorsal foot wound appears deeper, but this may be an illusion secondary to the swelling. There is a fair amount of slough accumulation in the wound. The medial leg wound, as expected, shows signs of skin necrosis and partial dehiscence. There is  hematoma and necrotic fat visible. 01/10/2023: She removed her wrap about 4 days ago; despite this, both wounds are smaller and cleaner. There is still slough accumulation in both sites. 01/18/2023: She came in with her wrap on today but once it was removed, her leg began to visibly swell; it was quite an interesting phenomenon to observe. I suspect that she has poor oncotic pressure due to her low albumin. As result, her edema control is suboptimal. Both wounds are smaller with just a little bit of slough present. Patient History Information obtained from Patient. Family History Unknown History, Diabetes - Father, Heart Disease - Father, No family history of Cancer. Social History Former smoker - quit 2003, Marital Status - Married, Alcohol Use - Daily, Drug Use - Prior History, Caffeine Use - Daily. Medical History Eyes Denies history of Cataracts, Glaucoma, Optic Neuritis Ear/Nose/Mouth/Throat Denies history of Chronic sinus problems/congestion, Middle ear problems Respiratory Patient has history of Asthma, Chronic Obstructive Pulmonary Disease (COPD) Cardiovascular Patient has history of Congestive Heart Failure, Hypertension Denies history of Coronary Artery Disease, Deep Vein Thrombosis, Hypotension, Myocardial Infarction Endocrine Denies history of Type I Diabetes, Type II Diabetes Immunological Denies history of Lupus Erythematosus, Raynaudoos, Scleroderma Integumentary (Skin) Denies history of History of Burn Musculoskeletal Patient has history of Rheumatoid Arthritis Denies history of Gout Neurologic Patient has history of Neuropathy Oncologic Patient has history of Received Chemotherapy - currently receiving Hospitalization/Surgery History - reverse shoulder arthroplasty Rt- 2018. Medical A Surgical History Notes nd Cardiovascular AFIB Genitourinary Stage 3 CKD Objective Constitutional no acute distress. Vitals Time Taken: 3:14 PM, Height: 64 in, Weight: 111  lbs, BMI: 19.1, Temperature: 98.2 F, Pulse: 73 bpm, Respiratory Rate: 18 breaths/min, Blood Pressure: 122/75 mmHg. Respiratory Normal work of breathing on room air. General Notes: 01/18/2023: Her edema control is suboptimal. Both wounds are smaller with just a little bit of slough present. Integumentary (Hair, Skin) Wound #1 status is Open. Original cause of wound was Blister. The date acquired was: 08/23/2022. The wound has been in treatment 17 weeks. The wound is located on the Right,Dorsal Foot. The wound measures 0.4cm length x 0.4cm width x 0.1cm depth; 0.126cm^2 area and 0.013cm^3 volume. There is Fat [name], [name] ([id number]) [id number] Page 8 of 11 (Subcutaneous Tissue) exposed. There is no tunneling or undermining noted. There  is a medium amount of serosanguineous drainage noted. The wound margin is distinct with the outline attached to the wound base. There is large (67-100%) pink granulation within the wound bed. There is a small (1-33%) amount of necrotic tissue within the wound bed including Adherent Slough. The periwound skin appearance had no abnormalities noted for texture. The periwound skin appearance exhibited: Dry/Scaly. The periwound skin appearance did not exhibit: Erythema. Periwound temperature was noted as No Abnormality. Wound #3 status is Open. Original cause of wound was Laceration. The date acquired was: 12/18/2022. The wound has been in treatment 3 weeks. The wound is located on the Right,Medial Lower Leg. The wound measures 2.3cm length x 1.7cm width x 0.1cm depth; 3.071cm^2 area and 0.307cm^3 volume. There is Fat Layer (Subcutaneous Tissue) exposed. There is no tunneling or undermining noted. There is a medium amount of serosanguineous drainage noted. There is small (1-33%) red granulation within the wound bed. There is a large (67-100%) amount of necrotic tissue within the wound bed including Eschar and Adherent Slough. The  periwound skin appearance had no abnormalities noted for texture. The periwound skin appearance had no abnormalities noted for moisture. The periwound skin appearance had no abnormalities noted for color. Periwound temperature was noted as No Abnormality. 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 There was a Selective/Open Wound Non-Viable Tissue Debridement with a total area of 0.16 sq cm performed by Sherry Maudlin, MD. With the following instrument(s): Curette to remove Non-Viable tissue/material. Material removed includes [facility] after achieving pain control using Lidocaine 5% topical ointment. No specimens were taken. A time out was conducted at 15:28, prior to the start of the procedure. A Minimum amount of bleeding was controlled with Pressure. The procedure was tolerated well. Post Debridement Measurements: 0.4cm length x 0.4cm width x 0.1cm depth; 0.013cm^3 volume. Character of Wound/Ulcer Post Debridement requires further debridement. Post procedure Diagnosis Wound #1: Same as Pre-Procedure General Notes: Scribed for Dr. Celine [name] by Sherry East, RN. Wound #3 Pre-procedure diagnosis of Wound #3 is a Trauma, Other located on the Right,Medial Lower Leg . There was a Selective/Open Wound Non-Viable Tissue Debridement with a total area of 3.91 sq cm performed by Sherry Maudlin, MD. With the following instrument(s): Curette to remove Non-Viable tissue/material. Material removed includes  [facility] after achieving pain control using Lidocaine 5% topical ointment. No specimens were taken. A time out was conducted at 15:28, prior to the start of the procedure. A Minimum amount of bleeding was controlled with Pressure. The procedure was tolerated well. Post Debridement Measurements: 2.3cm length x 1.7cm width x 0.1cm depth; 0.307cm^3 volume. Character of Wound/Ulcer Post Debridement requires further debridement. Post procedure Diagnosis Wound #3: Same as Pre-Procedure General Notes: Scribed for Dr. Celine [name] by Sherry East, RN. Plan Follow-up Appointments: Return Appointment in 1 week. - Dr. Celine [name] Room 2 Bathing/ Shower/ Hygiene: Other Bathing/Shower/Hygiene Orders/Instructions: - Please do not get LEG WRAPS on Right leg wet. Sponge bathe or use a cast protector on right leg if showering/bathing. Edema Control - Lymphedema / SCD / Other: Avoid standing for long periods of time. Patient to wear own compression  stockings every day. Compression stocking or Garment 20-30 mm/Hg pressure to: - left leg WOUND #1: - Foot Wound Laterality: Dorsal, Right Cleanser: Soap and Water Every Other Day/15 Days Discharge Instructions: May shower and wash wound with dial antibacterial soap and water prior to dressing change. Cleanser: Wound Cleanser (Generic) Every Other Day/15 Days Discharge Instructions: Cleanse the wound with wound cleanser prior to applying a clean dressing using gauze sponges, not tissue or cotton balls. Prim Dressing: Promogran Prisma Matrix, 4.34 (sq in) (silver collagen) Every Other Day/15 Days ary Discharge Instructions: Moisten collagen with saline or hydrogel Secondary Dressing: Woven Gauze Sponge, Non-Sterile 4x4 in (Generic) Every Other Day/15 Days Discharge Instructions: Apply over primary dressing as directed. Secondary Dressing: Zetuvit Plus Silicone Border Dressing 4x4 (in/in) (Generic) Every Other Day/15 Days Discharge Instructions: Apply silicone border over  primary dressing as directed. WOUND #3: - Lower Leg Wound Laterality: Right, Medial Cleanser: Soap and Water 1 x Per Week/30 Days Discharge Instructions: May shower and wash wound with dial antibacterial soap and water prior to dressing change. Cleanser: Wound Cleanser 1 x Per Week/30 Days Discharge Instructions: Cleanse the wound with wound cleanser prior to applying a clean dressing using gauze sponges, not tissue or cotton balls. Prim Dressing: Sorbalgon AG Dressing 2x2 (in/in) 1 x Per Week/30 Days ary Discharge Instructions: Apply to wound bed as instructed [name], [name] ([id number]) [phone].pdf Page 9 of 11 Secondary Dressing: ABD Pad, 8x10 1 x Per Week/30 Days Discharge Instructions: Apply over primary dressing as directed. Secured With: Elastic Bandage 4 inch (ACE bandage) 1 x Per Week/30 Days Discharge Instructions: Secure with ACE bandage as directed. Secured With: [employer], 4.5x3.1 (in/yd) 1 x Per Week/30 Days Discharge Instructions: Secure with Kerlix as directed. 01/18/2023: Her edema control is suboptimal. Both wounds are smaller with just a little bit of slough present. I used a curette to debride slough from both of her wounds. I am going to change the contact layer on her dorsal foot to Prisma silver collagen. Continue silver alginate to the medial leg wound. Continue Kerlix and Ace bandage compression. She is due for chemotherapy and asked if this would affect her wound healing. I said that there is certainly some effect but there is no reason to skip her chemotherapy in an effort to get her wounds healed and that I certainly would not recommend that she do so. Follow-up in 1 week. Electronic Signature(s) Signed: 01/18/2023 3:49:43 PM By: Sherry Maudlin MD FACS Entered By: [name] on 01/18/2023 15:49:43 -------------------------------------------------------------------------------- HxROS Details Patient Name: Date of  Service: [name]. 01/18/2023 3:00 PM Medical Record Number: [id number] Patient Account Number: [id number] Date of Birth/Sex: Treating RN: [birth year] (77 y.o. F) Primary Care Provider: Marcy [name] Other Clinician: Referring Provider: Treating Provider/Extender: [name], ELA [name] Weeks in Treatment: 17 Information Obtained From Patient Eyes Medical History: Negative for: Cataracts; Glaucoma; Optic Neuritis Ear/Nose/Mouth/Throat Medical History: Negative for: Chronic sinus problems/congestion; Middle ear problems Respiratory Medical History: Positive for: Asthma; Chronic Obstructive Pulmonary Disease (COPD) Cardiovascular Medical History: Positive for: Congestive Heart Failure; Hypertension Negative for: Coronary Artery Disease; Deep Vein Thrombosis; Hypotension; Myocardial Infarction Past Medical History Notes: AFIB Endocrine Medical History: Negative for: Type I Diabetes; Type II Diabetes Genitourinary Medical History: Past Medical History Notes: Stage 3 CKD Immunological Medical HistoryAAIRA, [name] ([id number]) [id number] Page 10 of 11 Negative for: Lupus Erythematosus; Raynauds; Scleroderma Integumentary (Skin) Medical History: Negative for: History of Burn Musculoskeletal Medical History: Positive for: Rheumatoid  Arthritis Negative for: Gout Neurologic Medical History: Positive for: Neuropathy Oncologic Medical History: Positive for: Received Chemotherapy - currently receiving Immunizations Pneumococcal Vaccine: Received Pneumococcal Vaccination: Yes Received Pneumococcal Vaccination On or After 60th Birthday: Yes Implantable Devices None Hospitalization / Surgery History Type of Hospitalization/Surgery reverse shoulder arthroplasty Rt- 2018 Family and Social History Unknown History: Yes; Cancer: No; Diabetes: Yes - Father; Heart Disease: Yes - Father; Former smoker - quit 2003; Marital  Status - Married; Alcohol Use: Daily; Drug Use: Prior History; Caffeine Use: Daily; Financial Concerns: No; Food, Clothing or Shelter Needs: No; Support System Lacking: No; Transportation Concerns: No Electronic Signature(s) Signed: 01/19/2023 7:39:53 AM By: Sherry Maudlin MD FACS Entered By: [name] on 01/18/2023 15:45:07 -------------------------------------------------------------------------------- SuperBill Details Patient Name: Date of Service: Sherry Masson RO Ovidio Hanger 01/18/2023 Medical Record Number: [id number] Patient Account Number: [id number] Date of Birth/Sex: Treating RN: 1945/12/27 (77 y.o. 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 L97.812 Non-pressure chronic ulcer of other part of right lower leg with fat layer exposed Z16.96 Chronic systolic (congestive) heart failure M06.9 Rheumatoid arthritis, unspecified Z79.52 Long term (current) use of systemic steroids I70.90 Unspecified atherosclerosis 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 [name], [name] ([id number]) [phone].pdf Page 11 of 11 Facility Procedures : CPT4 Code: [id number] Description: [phone] - DEBRIDE WOUND 1ST 20 SQ CM OR < ICD-10 Diagnosis Description 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 wi L97.812 Non-pressure chronic ulcer of other part of right lower leg with fat layer  expose Modifier: thout evidence of n d Quantity: 1 ecrosis Physician Procedures : CPT4 Code Description Modifier [id number] - WC PHYS LEVEL 3 - EST PT 25 ICD-10 Diagnosis Description L97.515 Non-pressure chronic ulcer of other part of right foot  with muscle involvement without evidence of ne L97.812 Non-pressure chronic ulcer of  other part of right lower leg with fat layer exposed Z79.52 Long term (current) use of systemic steroids 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

## 2023-01-19 NOTE — Telephone Encounter (Signed)
Left patient a vm regarding appointment change 1/29

## 2023-01-21 NOTE — Assessment & Plan Note (Signed)
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  -She began azacitadine on 02/06/22. she has had multiple hospital admissions and her performance status has been very low.  Changed her chemo to every 6 weeks in 08/2022 due to her fatigue. 

## 2023-01-22 ENCOUNTER — Inpatient Hospital Stay (HOSPITAL_BASED_OUTPATIENT_CLINIC_OR_DEPARTMENT_OTHER): Payer: Medicare Other | Admitting: Hematology

## 2023-01-22 ENCOUNTER — Inpatient Hospital Stay: Payer: Medicare Other

## 2023-01-22 ENCOUNTER — Encounter: Payer: Self-pay | Admitting: Hematology

## 2023-01-22 VITALS — BP 140/61 | HR 60 | Temp 98.7°F | Resp 18 | Wt 103.4 lb

## 2023-01-22 DIAGNOSIS — C931 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: Secondary | ICD-10-CM | POA: Diagnosis not present

## 2023-01-22 DIAGNOSIS — Z95828 Presence of other vascular implants and grafts: Secondary | ICD-10-CM

## 2023-01-22 DIAGNOSIS — Z5111 Encounter for antineoplastic chemotherapy: Secondary | ICD-10-CM | POA: Diagnosis not present

## 2023-01-22 LAB — BASIC METABOLIC PANEL - CANCER CENTER ONLY
Anion gap: 7 (ref 5–15)
BUN: 16 mg/dL (ref 8–23)
CO2: 29 mmol/L (ref 22–32)
Calcium: 9.1 mg/dL (ref 8.9–10.3)
Chloride: 101 mmol/L (ref 98–111)
Creatinine: 0.89 mg/dL (ref 0.44–1.00)
GFR, Estimated: >60 mL/min (ref >60)
Glucose, Bld: 102 mg/dL — ABNORMAL HIGH (ref 70–99)
Potassium: 4.2 mmol/L (ref 3.5–5.1)
Sodium: 137 mmol/L (ref 135–145)

## 2023-01-22 LAB — CBC WITH DIFFERENTIAL (CANCER CENTER ONLY)
Abs Immature Granulocytes: 0.08 10*3/uL — ABNORMAL HIGH (ref 0.00–0.07)
Basophils Absolute: 0.2 10*3/uL — ABNORMAL HIGH (ref 0.0–0.1)
Basophils Relative: 2 %
Eosinophils Absolute: 1.4 10*3/uL — ABNORMAL HIGH (ref 0.0–0.5)
Eosinophils Relative: 13 %
HCT: 33 % — ABNORMAL LOW (ref 36.0–46.0)
Hemoglobin: 11 g/dL — ABNORMAL LOW (ref 12.0–15.0)
Immature Granulocytes: 1 %
Lymphocytes Relative: 21 %
Lymphs Abs: 2.3 10*3/uL (ref 0.7–4.0)
MCH: 35.1 pg — ABNORMAL HIGH (ref 26.0–34.0)
MCHC: 33.3 g/dL (ref 30.0–36.0)
MCV: 105.4 fL — ABNORMAL HIGH (ref 80.0–100.0)
Monocytes Absolute: 2.7 10*3/uL — ABNORMAL HIGH (ref 0.1–1.0)
Monocytes Relative: 24 %
Neutro Abs: 4.3 10*3/uL (ref 1.7–7.7)
Neutrophils Relative %: 39 %
Platelet Count: 177 10*3/uL (ref 150–400)
RBC: 3.13 MIL/uL — ABNORMAL LOW (ref 3.87–5.11)
RDW: 14.5 % (ref 11.5–15.5)
WBC Count: 10.9 10*3/uL — ABNORMAL HIGH (ref 4.0–10.5)
nRBC: 0 % (ref 0.0–0.2)

## 2023-01-22 MED ORDER — SODIUM CHLORIDE 0.9 % IV SOLN: Freq: Once | INTRAVENOUS | Status: AC

## 2023-01-22 MED ORDER — SODIUM CHLORIDE 0.9 % IV SOLN
75.0000 mg/m2 | Freq: Once | INTRAVENOUS | Status: AC
  Administered 2023-01-22: 115 mg via INTRAVENOUS
  Filled 2023-01-22: qty 11.5

## 2023-01-22 MED ORDER — ONDANSETRON HCL 4 MG/2ML IJ SOLN
8.0000 mg | Freq: Once | INTRAMUSCULAR | Status: AC
  Administered 2023-01-22: 8 mg via INTRAVENOUS
  Filled 2023-01-22: qty 4

## 2023-01-22 MED ORDER — SODIUM CHLORIDE 0.9% FLUSH
10.0000 mL | Freq: Once | INTRAVENOUS | Status: AC
  Administered 2023-01-22: 10 mL

## 2023-01-22 NOTE — Progress Notes (Signed)
[place]   Telephone:([phone]) [phone] Fax:(336) [phone]   Clinic Follow up Note   Patient Care Team: [name], MD as PCP - General (Family Medicine) [name], Wonda Cheng, MD as PCP - Cardiology (Cardiology) Gavin Pound, MD as Consulting Physician (Rheumatology) Truitt Merle, MD as Consulting Physician (Hematology) Juanita Craver, MD as Consulting Physician (Gastroenterology) Garner Nash, DO as Consulting Physician (Pulmonary Disease)  Date of Service:  01/22/2023  CHIEF COMPLAINT: f/u of CMML    CURRENT THERAPY:  ASSESSMENT:  [name] is a 77 y.o. 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 Her BM biopsy was reviewed at [facility] and was felt to be CMML-1 with 5% blasts.  -She began azacitadine on 02/06/22. she has had multiple hospital admissions and her performance status has been very low.  Changed her chemo to every 6 weeks in 08/2022 due to her fatigue. -She is clinically stable, fatigue overall slightly improved, no other new complaints. -Lab reviewed, adequate for treatment, will proceed cycle 5 Vidaza -Continue to monitor her blood counts closely -Follow-up in 6 weeks for cycle 6 Vidaza     PLAN: - encourage patient to be more active at home to improved strength  -Labs reviewed, adequate for treatment, will proceed cycle 5 provide dated today, and continue daily for next 4 days - forward today's office visit notes to Dr. Trudie Reed Rheumatologist, patient would like to restart her rheumatoid arthritis treatment. 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  Please see concurrent tissue biopsy (below) for additional work-up and final diagnosis.    FINAL MICROSCOPIC DIAGNOSIS:   A. SOFT TISSUE MASS, PRE SACRAL, NEEDLE CORE BIOPSY:  -  Chronic lymphocytic leukemia/small lymphocytic lymphoma  -  Extra medullary hematopoiesis  -  See comment   COMMENT:  The biopsy consists of multiple soft tissue cores with lymphoid nodules and a dense hematopoietic infiltrate consistent with extra medullary hematopoiesis.  MPO and E-cadherin highlight myeloid and erythroid precursors respectively.  CD34 highlights increased vasculature and is also positive within the cytoplasm of megakaryocytes.  A few small, immature mononuclear cells appear to be positive for CD34 and CD117. TdT shows rare, scattered positive cells.  CD20 highlights aggregates of B cells which are admixed with CD3 positive T cells.  T cells are an admixture of CD4 and CD8.  The B cells are also positive for CD5, CD23 and Bcl-2.  The B cells do not show significant staining for CD10, BCL6 or cyclin D1.  CD138 highlights scattered plasma cells which are polytypic by kappa and lambda in situ hybridization.  Flow cytometry performed on the sample (see WL [id number]) identified a kappa restricted CD5 positive B-cell population  comprising 70% of lymphocytes.  In addition, a small myeloblast population comprised 2% of the total cellular events.   Overall, the findings are consistent with soft tissue involvement by  chronic lymphocytic leukemia/small  lymphocytic lymphoma and extra medullary hematopoiesis. In reviewing the patient's CBC data (macrocytic anemia and thrombocytopenia), I would recommend a bone marrow biopsy to assess for marrow involvement by CLL/SLL.    12/23/2021 Imaging   EXAM: CT CHEST, ABDOMEN, AND PELVIS WITH CONTRAST  IMPRESSION: 1. Slight interval enlargement of a presacral soft tissue mass measuring 7.2 x 4.7 cm, previously 6.9 x 4.1 cm on prior MR of the pelvis dated 07/04/2021. By report, this represents a biopsy proven lymphoma. 2. Pleural nodule of the dependent right lower lobe overlying the posterior right tenth rib and pleural or paraspinous soft tissue mass overlying the right aspect of the T10 vertebral body, very slightly enlarged compared to prior examination of the chest dated 06/25/2020, consistent with additional sites of lymphomatous involvement given very indolent growth. These could be better assessed for metabolic activity by FDG PET/CT if desired. 3. There is mild, bibasilar predominant pulmonary fibrosis in a pattern featuring irregular peripheral interstitial opacity, septal thickening, but without clear evidence of subpleural bronchiolectasis or honeycombing, with a somewhat asymmetric distribution most conspicuously involving the right lower lobe and lingula. These findings are significantly worsened when compared to prior examination dated 06/25/2020, particularly in the right lower lobe. Given interval change, this may reflect sequelae of interval infection or aspiration, however appearance is generally suspicious for fibrotic interstitial lung disease, and if characterized by ATS pulmonary fibrosis is in an "indeterminate for UIP" pattern, differential considerations including both UIP and NSIP. 4. Emphysema.   Aortic Atherosclerosis (ICD10-I70.0) and Emphysema (ICD10-J43.9).   01/05/2022 Pathology Results   DIAGNOSIS:   BONE MARROW, ASPIRATE, CLOT, CORE:  -Hypercellular bone marrow  for age with features of  myelodysplastic/myeloproliferative neoplasm  -Minor abnormal B-cell population  -See comment   PERIPHERAL BLOOD:  -Macrocytic anemia  -Neutrophilic left shift and monocytosis  -Thrombocytopenia   COMMENT:  The bone marrow is hypercellular for age with dyspoietic changes  involving myeloid cell lines associated with monocytosis and increased number of blastic cells (12%) as primarily seen by morphology, many of which display monocytic features.  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  In this background, there are several predominantly small lymphoid aggregates mostly composed of small lymphoid cells.  By flow cytometry, a minor abnormal B-cell population expressing CD5 is seen and representing 2% of all cells.  This correlate with previously known B-cell lymphoproliferative process.  Correlation with cytogenetic and FISH studies is strongly recommended.    DIAGNOSIS:   -Increased number of monocytic cells present (25%)  -Minor abnormal B-cell population identified.  -See comment   COMMENT:  Flow cytometric analysis shows increased number of monocytic cells representing 25% of all cells but without aberrant phenotype or CD34 expression.  A significant CD34-positive blastic population is not identified.  The lymphoid population shows a minor B-cell population representing 2% of all cells and expressing B-cell antigens including CD20 associated with CD5, CD200 and possibly dim kappa expression.  The latter findings are abnormal and correlate with previously known B-cell lymphoproliferative process.  No significant T-cell phenotypic abnormalities identified.    01/12/2022 Initial Diagnosis   CMML (chronic myelomonocytic leukemia) ([place])   01/12/2022 Cancer Staging   Staging form: Chronic  Myeloid Leukemia,  AJCC 8th Edition - Clinical stage from 01/12/2022: Bone marrow blast count (%): 12, Additional clonal changes: Unknown - Signed by Truitt Merle, MD on 01/12/2022 Stage prefix: Initial diagnosis   02/06/2022 - 08/11/2022 Chemotherapy   Patient is on Treatment Plan : MYELODYSPLASIA  Azacitidine IV D1-7 q28d     02/06/2022 -  Chemotherapy   Patient is on Treatment Plan : MYELODYSPLASIA  Azacitidine IV D1-7 q28d        INTERVAL HISTORY:  [name] is here for a follow up of CMML.   She was last seen by Dr. Truitt Merle on 12/11/2022. She presents to the clinic alone in infusion, pt states she is having itching on arms and hands, pt has an injury on her lower right ankle that required stitches it is healing well, no fevers, chills, or shortness of breath, pt has crackling in her left lung, appetite is good, patient able to cook and care for herself, pt is having low back pain but it is manageable, pt is having joint pain     All other systems were reviewed with the patient and are negative.  MEDICAL HISTORY:  Past Medical History:  Diagnosis Date   Arthritis    Rheumatoid arthritis   Celiac disease    Chronic kidney disease    stage 3 from MD notes   COPD (chronic obstructive pulmonary disease) ([place])    Dyspnea    with going up stairs   Family history of adverse reaction to anesthesia    father had hard time waking up   Headache    sinus headaches   Hot flashes    Hypertension    Iron deficiency anemia    Pneumonia    per patient "I have walking pneumonia"    SURGICAL HISTORY: Past Surgical History:  Procedure Laterality Date   COLONOSCOPY     ECTOPIC PREGNANCY SURGERY      x 2   IR IMAGING GUIDED PORT INSERTION  01/31/2022   REVERSE SHOULDER ARTHROPLASTY Right 02/02/2017   Procedure: RIGHT REVERSE SHOULDER ARTHROPLASTY;  Surgeon: Netta Cedars, MD;  Location: [facility];  Service: Orthopedics;  Laterality: Right;    I have reviewed the social history and family  history with the patient and they are unchanged from previous note.  ALLERGIES:  is allergic to digoxin and related, gluten meal, penicillins, alendronate, and hydroxychloroquine.  MEDICATIONS:  Current Outpatient Medications  Medication Sig Dispense Refill   denosumab (PROLIA) 60 MG/ML SOSY injection Inject 60 mg into the skin every 6 (six) months.     diltiazem (CARDIZEM CD) 240 MG 24 hr capsule Take 1 capsule (240 mg total) by mouth daily. 30 capsule 6   furosemide (LASIX) 20 MG tablet Take 1 tablet (20 mg total) by mouth daily. 30 tablet 11   gabapentin (NEURONTIN) 300 MG capsule Take 300 mg by mouth in the morning.     Metoprolol Tartrate 75 MG TABS Take 150 mg by mouth 2 (two) times daily. 360 tablet 3   potassium chloride SA (KLOR-CON M20) 20 MEQ tablet Take 1 tablet (20 mEq total) by mouth daily. Take with furosemide 90 tablet 3   Tiotropium Bromide-Olodaterol (STIOLTO RESPIMAT) 2.5-2.5 MCG/ACT AERS Inhale 2 puffs into the lungs daily. 4 g 5   No current facility-administered medications for this visit.   Facility-Administered Medications Ordered in Other Visits  Medication Dose Route Frequency Provider Last Rate Last Admin   acetaminophen (TYLENOL) 325 MG tablet  diphenhydrAMINE (BENADRYL) 25 mg capsule             PHYSICAL EXAMINATION: ECOG PERFORMANCE STATUS: 3 - Symptomatic, >50% confined to bed  There were no vitals filed for this visit. Wt Readings from Last 3 Encounters:  01/22/23 103 lb 6 oz (46.9 kg)  01/03/23 106 lb 3.2 oz (48.2 kg)  12/17/22 106 lb (48.1 kg)    GENERAL:alert, no distress and comfortable SKIN: skin color, texture, turgor are normal, no rashes or significant lesions EYES: normal, Conjunctiva are pink and non-injected, sclera clear NECK: supple, thyroid normal size, non-tender, without nodularity LYMPH:  no palpable lymphadenopathy in the cervical, axillary  LUNGS: (-)clear to auscultation and percussion with normal breathing  effort HEART: (-)regular rate & rhythm and no murmurs and no lower extremity edema ABDOMEN:abdomen soft, non-tender and normal bowel sounds Musculoskeletal:no cyanosis of digits and no clubbing  NEURO: alert & oriented x 3 with fluent speech, no focal motor/sensory deficits  LABORATORY DATA:  I have reviewed the data as listed    Latest Ref Rng & Units 01/22/2023   12:53 PM 01/09/2023   12:03 PM 12/26/2022   11:47 AM  CBC  WBC 4.0 - 10.5 K/uL 10.9  6.0  4.8   Hemoglobin 12.0 - 15.0 g/dL 11.0  10.8  9.9   Hematocrit 36.0 - 46.0 % 33.0  32.9  30.2   Platelets 150 - 400 K/uL 177  116  37         Latest Ref Rng & Units 01/22/2023   12:53 PM 01/09/2023   12:03 PM 12/26/2022   11:47 AM  CMP  Glucose 70 - 99 mg/dL 102  90  101   BUN 8 - 23 mg/dL '16  14  17   '$ Creatinine 0.44 - 1.00 mg/dL 0.89  0.82  0.91   Sodium 135 - 145 mmol/L 137  140  140   Potassium 3.5 - 5.1 mmol/L 4.2  4.6  4.3   Chloride 98 - 111 mmol/L 101  105  103   CO2 22 - 32 mmol/L '29  28  30   '$ Calcium 8.9 - 10.3 mg/dL 9.1  9.2  9.0   Total Protein 6.5 - 8.1 g/dL  5.7  5.4   Total Bilirubin 0.3 - 1.2 mg/dL  0.6  0.5   Alkaline Phos 38 - 126 U/L  81  57   AST 15 - 41 U/L  20  18   ALT 0 - 44 U/L  12  11       RADIOGRAPHIC STUDIES: I have personally reviewed the radiological images as listed and agreed with the findings in the report. No results found.    No orders of the defined types were placed in this encounter.  All questions were answered. The patient knows to call the clinic with any problems, questions or concerns. No barriers to learning was detected. The total time spent in the appointment was 30 minutes.     Truitt Merle, MD 01/22/2023   I, Maurine Simmering, CMA, am acting as scribe for Truitt Merle, MD.   I have reviewed the above documentation for accuracy and completeness, and I agree with the above.

## 2023-01-23 ENCOUNTER — Inpatient Hospital Stay: Payer: Medicare Other

## 2023-01-23 ENCOUNTER — Telehealth: Payer: Self-pay

## 2023-01-23 ENCOUNTER — Telehealth: Payer: Self-pay | Admitting: Hematology

## 2023-01-23 VITALS — BP 113/50 | HR 70 | Temp 98.2°F | Resp 12

## 2023-01-23 DIAGNOSIS — C931 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: Secondary | ICD-10-CM

## 2023-01-23 DIAGNOSIS — Z5111 Encounter for antineoplastic chemotherapy: Secondary | ICD-10-CM | POA: Diagnosis not present

## 2023-01-23 MED ORDER — SODIUM CHLORIDE 0.9% FLUSH
10.0000 mL | INTRAVENOUS | Status: DC | PRN
  Administered 2023-01-23: 10 mL

## 2023-01-23 MED ORDER — SODIUM CHLORIDE 0.9 % IV SOLN
75.0000 mg/m2 | Freq: Once | INTRAVENOUS | Status: AC
  Administered 2023-01-23: 115 mg via INTRAVENOUS
  Filled 2023-01-23: qty 11.5

## 2023-01-23 MED ORDER — SODIUM CHLORIDE 0.9 % IV SOLN: Freq: Once | INTRAVENOUS | Status: AC

## 2023-01-23 MED ORDER — HEPARIN SOD (PORK) LOCK FLUSH 100 UNIT/ML IV SOLN
500.0000 [IU] | Freq: Once | INTRAVENOUS | Status: AC | PRN
  Administered 2023-01-23: 500 [IU]

## 2023-01-23 MED ORDER — ONDANSETRON HCL 4 MG/2ML IJ SOLN
8.0000 mg | Freq: Once | INTRAMUSCULAR | Status: AC
  Administered 2023-01-23: 8 mg via INTRAVENOUS
  Filled 2023-01-23: qty 4

## 2023-01-23 NOTE — Telephone Encounter (Signed)
Left a voicemail regarding all upcoming appointments

## 2023-01-23 NOTE — Telephone Encounter (Signed)
Faxed patients last office visit note to Dr. Gavin Pound as per Dr. Burr Medico and as per the patient.

## 2023-01-23 NOTE — Patient Instructions (Signed)
[facility]  Discharge Instructions: Thank you for choosing [facility] to provide your oncology and hematology care.   If you have a lab appointment with the Cancer Center, please go directly to the Cancer Center and check in at the registration area.   Wear comfortable clothing and clothing appropriate for easy access to any Portacath or PICC line.   We strive to give you quality time with your provider. You may need to reschedule your appointment if you arrive late (15 or more minutes).  Arriving late affects you and other patients whose appointments are after yours.  Also, if you miss three or more appointments without notifying the office, you may be dismissed from the clinic at the provider's discretion.      For prescription refill requests, have your pharmacy contact our office and allow 72 hours for refills to be completed.    Today you received the following chemotherapy and/or immunotherapy agents Vidaza      To help prevent nausea and vomiting after your treatment, we encourage you to take your nausea medication as directed.  BELOW ARE SYMPTOMS THAT SHOULD BE REPORTED IMMEDIATELY: *FEVER GREATER THAN 100.4 F (38 C) OR HIGHER *CHILLS OR SWEATING *NAUSEA AND VOMITING THAT IS NOT CONTROLLED WITH YOUR NAUSEA MEDICATION *UNUSUAL SHORTNESS OF BREATH *UNUSUAL BRUISING OR BLEEDING *URINARY PROBLEMS (pain or burning when urinating, or frequent urination) *BOWEL PROBLEMS (unusual diarrhea, constipation, pain near the anus) TENDERNESS IN MOUTH AND THROAT WITH OR WITHOUT PRESENCE OF ULCERS (sore throat, sores in mouth, or a toothache) UNUSUAL RASH, SWELLING OR PAIN  UNUSUAL VAGINAL DISCHARGE OR ITCHING   Items with * indicate a potential emergency and should be followed up as soon as possible or go to the Emergency Department if any problems should occur.  Please show the CHEMOTHERAPY ALERT CARD or IMMUNOTHERAPY ALERT CARD at check-in  to the Emergency Department and triage nurse.  Should you have questions after your visit or need to cancel or reschedule your appointment, please contact [facility]  Dept: [phone]  and follow the prompts.  Office hours are 8:00 a.m. to 4:30 p.m. Monday - Friday. Please note that voicemails left after 4:00 p.m. may not be returned until the following business day.  We are closed weekends and major holidays. You have access to a nurse at all times for urgent questions. Please call the main number to the clinic Dept: [phone] and follow the prompts.   For any non-urgent questions, you may also contact your provider using MyChart. We now offer e-Visits for anyone 18 and older to request care online for non-urgent symptoms. For details visit [email or web address].   Also download the MyChart app! Go to the app store, search "MyChart", open the app, select [place], and log in with your MyChart username and password.   

## 2023-01-24 ENCOUNTER — Inpatient Hospital Stay: Payer: Medicare Other

## 2023-01-24 ENCOUNTER — Telehealth: Payer: Self-pay | Admitting: Pulmonary Disease

## 2023-01-24 ENCOUNTER — Other Ambulatory Visit: Payer: Self-pay | Admitting: Hematology

## 2023-01-24 ENCOUNTER — Other Ambulatory Visit: Payer: Self-pay

## 2023-01-24 ENCOUNTER — Other Ambulatory Visit: Payer: Self-pay | Admitting: Pulmonary Disease

## 2023-01-24 ENCOUNTER — Inpatient Hospital Stay: Payer: Medicare Other | Admitting: Hematology

## 2023-01-24 VITALS — BP 116/59 | HR 61 | Temp 98.4°F | Resp 18

## 2023-01-24 DIAGNOSIS — Z5111 Encounter for antineoplastic chemotherapy: Secondary | ICD-10-CM | POA: Diagnosis not present

## 2023-01-24 DIAGNOSIS — C931 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: Secondary | ICD-10-CM | POA: Diagnosis not present

## 2023-01-24 MED ORDER — SODIUM CHLORIDE 0.9 % IV SOLN: Freq: Once | INTRAVENOUS | Status: AC

## 2023-01-24 MED ORDER — SODIUM CHLORIDE 0.9% FLUSH
10.0000 mL | INTRAVENOUS | Status: DC | PRN
  Administered 2023-01-24: 10 mL

## 2023-01-24 MED ORDER — ONDANSETRON HCL 4 MG/2ML IJ SOLN
8.0000 mg | Freq: Once | INTRAMUSCULAR | Status: AC
  Administered 2023-01-24: 8 mg via INTRAVENOUS
  Filled 2023-01-24: qty 4

## 2023-01-24 MED ORDER — SODIUM CHLORIDE 0.9 % IV SOLN
75.0000 mg/m2 | Freq: Once | INTRAVENOUS | Status: AC
  Administered 2023-01-24: 115 mg via INTRAVENOUS
  Filled 2023-01-24: qty 11.5

## 2023-01-24 MED ORDER — HEPARIN SOD (PORK) LOCK FLUSH 100 UNIT/ML IV SOLN
500.0000 [IU] | Freq: Once | INTRAVENOUS | Status: AC | PRN
  Administered 2023-01-24: 500 [IU]

## 2023-01-24 MED ORDER — PROCHLORPERAZINE MALEATE 10 MG PO TABS: 10.0000 mg | ORAL_TABLET | Freq: Four times a day (QID) | ORAL | 2 refills | Status: DC | PRN

## 2023-01-24 NOTE — Patient Instructions (Addendum)
[facility]  Discharge Instructions: Thank you for choosing [facility] to provide your oncology and hematology care.   If you have a lab appointment with the Cancer Center, please go directly to the Cancer Center and check in at the registration area.   Wear comfortable clothing and clothing appropriate for easy access to any Portacath or PICC line.   We strive to give you quality time with your provider. You may need to reschedule your appointment if you arrive late (15 or more minutes).  Arriving late affects you and other patients whose appointments are after yours.  Also, if you miss three or more appointments without notifying the office, you may be dismissed from the clinic at the provider's discretion.      For prescription refill requests, have your pharmacy contact our office and allow 72 hours for refills to be completed.    Today you received the following chemotherapy and/or immunotherapy agents Vidaza      To help prevent nausea and vomiting after your treatment, we encourage you to take your nausea medication as directed.  BELOW ARE SYMPTOMS THAT SHOULD BE REPORTED IMMEDIATELY: *FEVER GREATER THAN 100.4 F (38 C) OR HIGHER *CHILLS OR SWEATING *NAUSEA AND VOMITING THAT IS NOT CONTROLLED WITH YOUR NAUSEA MEDICATION *UNUSUAL SHORTNESS OF BREATH *UNUSUAL BRUISING OR BLEEDING *URINARY PROBLEMS (pain or burning when urinating, or frequent urination) *BOWEL PROBLEMS (unusual diarrhea, constipation, pain near the anus) TENDERNESS IN MOUTH AND THROAT WITH OR WITHOUT PRESENCE OF ULCERS (sore throat, sores in mouth, or a toothache) UNUSUAL RASH, SWELLING OR PAIN  UNUSUAL VAGINAL DISCHARGE OR ITCHING   Items with * indicate a potential emergency and should be followed up as soon as possible or go to the Emergency Department if any problems should occur.  Please show the CHEMOTHERAPY ALERT CARD or IMMUNOTHERAPY ALERT CARD at check-in  to the Emergency Department and triage nurse.  Should you have questions after your visit or need to cancel or reschedule your appointment, please contact [facility]  Dept: [phone]  and follow the prompts.  Office hours are 8:00 a.m. to 4:30 p.m. Monday - Friday. Please note that voicemails left after 4:00 p.m. may not be returned until the following business day.  We are closed weekends and major holidays. You have access to a nurse at all times for urgent questions. Please call the main number to the clinic Dept: [phone] and follow the prompts.   For any non-urgent questions, you may also contact your provider using MyChart. We now offer e-Visits for anyone 18 and older to request care online for non-urgent symptoms. For details visit [email or web address].   Also download the MyChart app! Go to the app store, search "MyChart", open the app, select [facility], and log in with your MyChart username and password.   

## 2023-01-25 ENCOUNTER — Other Ambulatory Visit: Payer: Self-pay

## 2023-01-25 ENCOUNTER — Inpatient Hospital Stay: Payer: Medicare Other | Attending: Nurse Practitioner

## 2023-01-25 ENCOUNTER — Telehealth: Payer: Self-pay | Admitting: Hematology

## 2023-01-25 ENCOUNTER — Encounter: Payer: Self-pay | Admitting: Hematology

## 2023-01-25 VITALS — BP 124/49 | HR 63 | Temp 98.1°F | Resp 12

## 2023-01-25 DIAGNOSIS — C931 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: Secondary | ICD-10-CM | POA: Insufficient documentation

## 2023-01-25 DIAGNOSIS — Z5111 Encounter for antineoplastic chemotherapy: Secondary | ICD-10-CM | POA: Insufficient documentation

## 2023-01-25 MED ORDER — ONDANSETRON HCL 4 MG/2ML IJ SOLN
8.0000 mg | Freq: Once | INTRAMUSCULAR | Status: AC
  Administered 2023-01-25: 8 mg via INTRAVENOUS
  Filled 2023-01-25: qty 4

## 2023-01-25 MED ORDER — HEPARIN SOD (PORK) LOCK FLUSH 100 UNIT/ML IV SOLN
500.0000 [IU] | Freq: Once | INTRAVENOUS | Status: AC | PRN
  Administered 2023-01-25: 500 [IU]

## 2023-01-25 MED ORDER — SODIUM CHLORIDE 0.9 % IV SOLN
75.0000 mg/m2 | Freq: Once | INTRAVENOUS | Status: AC
  Administered 2023-01-25: 115 mg via INTRAVENOUS
  Filled 2023-01-25: qty 11.5

## 2023-01-25 MED ORDER — SODIUM CHLORIDE 0.9% FLUSH
10.0000 mL | INTRAVENOUS | Status: DC | PRN
  Administered 2023-01-25: 10 mL

## 2023-01-25 MED ORDER — SODIUM CHLORIDE 0.9 % IV SOLN: Freq: Once | INTRAVENOUS | Status: AC

## 2023-01-25 NOTE — Addendum Note (Signed)
Addended by: Truitt Merle on: 01/25/2023 03:26 PM   Modules accepted: Orders

## 2023-01-25 NOTE — Progress Notes (Signed)
Patient c/o of a rash on her legs and arms. This RN assess the rash on her thighs and upper arms, red, angry with some open areas where she has been scratching. She stated that she has been applying cream. She stated that Dr. Burr Medico did see the rash earlier in the week but it has since gotten worse. Per Dr. Burr Medico okay to treat, does not feel it is associated with Vidaza. Advised patient to apply a hydrocortisone/benadryl ointment. She stated that she has l/m with her PCP for a refill on the hydrocortisone cream. Advised her to f/u with her PCP since the rash has gotten worse. Patient verbalized understanding and had no other questions or concerns.

## 2023-01-25 NOTE — Patient Instructions (Signed)
[facility]  Discharge Instructions: Thank you for choosing [facility] to provide your oncology and hematology care.   If you have a lab appointment with the Cancer Center, please go directly to the Cancer Center and check in at the registration area.   Wear comfortable clothing and clothing appropriate for easy access to any Portacath or PICC line.   We strive to give you quality time with your provider. You may need to reschedule your appointment if you arrive late (15 or more minutes).  Arriving late affects you and other patients whose appointments are after yours.  Also, if you miss three or more appointments without notifying the office, you may be dismissed from the clinic at the provider's discretion.      For prescription refill requests, have your pharmacy contact our office and allow 72 hours for refills to be completed.    Today you received the following chemotherapy and/or immunotherapy agents Vidaza      To help prevent nausea and vomiting after your treatment, we encourage you to take your nausea medication as directed.  BELOW ARE SYMPTOMS THAT SHOULD BE REPORTED IMMEDIATELY: *FEVER GREATER THAN 100.4 F (38 C) OR HIGHER *CHILLS OR SWEATING *NAUSEA AND VOMITING THAT IS NOT CONTROLLED WITH YOUR NAUSEA MEDICATION *UNUSUAL SHORTNESS OF BREATH *UNUSUAL BRUISING OR BLEEDING *URINARY PROBLEMS (pain or burning when urinating, or frequent urination) *BOWEL PROBLEMS (unusual diarrhea, constipation, pain near the anus) TENDERNESS IN MOUTH AND THROAT WITH OR WITHOUT PRESENCE OF ULCERS (sore throat, sores in mouth, or a toothache) UNUSUAL RASH, SWELLING OR PAIN  UNUSUAL VAGINAL DISCHARGE OR ITCHING   Items with * indicate a potential emergency and should be followed up as soon as possible or go to the Emergency Department if any problems should occur.  Please show the CHEMOTHERAPY ALERT CARD or IMMUNOTHERAPY ALERT CARD at check-in  to the Emergency Department and triage nurse.  Should you have questions after your visit or need to cancel or reschedule your appointment, please contact [facility]  Dept: [phone]  and follow the prompts.  Office hours are 8:00 a.m. to 4:30 p.m. Monday - Friday. Please note that voicemails left after 4:00 p.m. may not be returned until the following business day.  We are closed weekends and major holidays. You have access to a nurse at all times for urgent questions. Please call the main number to the clinic Dept: [phone] and follow the prompts.   For any non-urgent questions, you may also contact your provider using MyChart. We now offer e-Visits for anyone 18 and older to request care online for non-urgent symptoms. For details visit [email or web address].   Also download the MyChart app! Go to the app store, search "MyChart", open the app, select [facility], and log in with your MyChart username and password.   

## 2023-01-25 NOTE — Telephone Encounter (Signed)
Contacted patient to scheduled appointments. Patient is aware of appointments that are scheduled.   

## 2023-01-26 ENCOUNTER — Encounter (HOSPITAL_BASED_OUTPATIENT_CLINIC_OR_DEPARTMENT_OTHER): Payer: Medicare Other | Attending: General Surgery | Admitting: General Surgery

## 2023-01-26 ENCOUNTER — Other Ambulatory Visit: Payer: Self-pay

## 2023-01-26 ENCOUNTER — Inpatient Hospital Stay: Payer: Medicare Other

## 2023-01-26 VITALS — BP 125/51 | HR 63 | Temp 97.7°F | Resp 17

## 2023-01-26 DIAGNOSIS — L97512 Non-pressure chronic ulcer of other part of right foot with fat layer exposed: Secondary | ICD-10-CM | POA: Diagnosis not present

## 2023-01-26 DIAGNOSIS — C931 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: Secondary | ICD-10-CM | POA: Diagnosis not present

## 2023-01-26 DIAGNOSIS — Z5111 Encounter for antineoplastic chemotherapy: Secondary | ICD-10-CM | POA: Diagnosis not present

## 2023-01-26 DIAGNOSIS — M069 Rheumatoid arthritis, unspecified: Secondary | ICD-10-CM | POA: Diagnosis not present

## 2023-01-26 DIAGNOSIS — N183 Chronic kidney disease, stage 3 unspecified: Secondary | ICD-10-CM | POA: Insufficient documentation

## 2023-01-26 DIAGNOSIS — E440 Moderate protein-calorie malnutrition: Secondary | ICD-10-CM | POA: Diagnosis not present

## 2023-01-26 DIAGNOSIS — J4489 Other specified chronic obstructive pulmonary disease: Secondary | ICD-10-CM | POA: Diagnosis not present

## 2023-01-26 DIAGNOSIS — I890 Lymphedema, not elsewhere classified: Secondary | ICD-10-CM | POA: Diagnosis not present

## 2023-01-26 DIAGNOSIS — S81801A Unspecified open wound, right lower leg, initial encounter: Secondary | ICD-10-CM | POA: Diagnosis not present

## 2023-01-26 DIAGNOSIS — L97812 Non-pressure chronic ulcer of other part of right lower leg with fat layer exposed: Secondary | ICD-10-CM | POA: Insufficient documentation

## 2023-01-26 DIAGNOSIS — L97822 Non-pressure chronic ulcer of other part of left lower leg with fat layer exposed: Secondary | ICD-10-CM | POA: Insufficient documentation

## 2023-01-26 DIAGNOSIS — L97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 evidence of necrosis: Secondary | ICD-10-CM | POA: Insufficient documentation

## 2023-01-26 DIAGNOSIS — I5022 Chronic systolic (congestive) heart failure: Secondary | ICD-10-CM | POA: Insufficient documentation

## 2023-01-26 DIAGNOSIS — I130 Hypertensive heart and chronic kidney disease with heart failure and stage 1 through stage 4 chronic kidney disease, or unspecified chronic kidney disease: Secondary | ICD-10-CM | POA: Diagnosis not present

## 2023-01-26 MED ORDER — SODIUM CHLORIDE 0.9% FLUSH
10.0000 mL | INTRAVENOUS | Status: DC | PRN
  Administered 2023-01-26: 10 mL

## 2023-01-26 MED ORDER — HEPARIN SOD (PORK) LOCK FLUSH 100 UNIT/ML IV SOLN
500.0000 [IU] | Freq: Once | INTRAVENOUS | Status: AC | PRN
  Administered 2023-01-26: 500 [IU]

## 2023-01-26 MED ORDER — SODIUM CHLORIDE 0.9 % IV SOLN: Freq: Once | INTRAVENOUS | Status: AC

## 2023-01-26 MED ORDER — ONDANSETRON HCL 4 MG/2ML IJ SOLN
8.0000 mg | Freq: Once | INTRAMUSCULAR | Status: AC
  Administered 2023-01-26: 8 mg via INTRAVENOUS
  Filled 2023-01-26: qty 4

## 2023-01-26 MED ORDER — SODIUM CHLORIDE 0.9 % IV SOLN
75.0000 mg/m2 | Freq: Once | INTRAVENOUS | Status: AC
  Administered 2023-01-26: 115 mg via INTRAVENOUS
  Filled 2023-01-26: qty 11.5

## 2023-01-26 NOTE — Patient Instructions (Signed)
Azacitidine Injection What is this medication? AZACITIDINE (ay [place]) treats blood and bone marrow cancers. It works by slowing down the growth of cancer cells. This medicine may be used for other purposes; ask your health care provider or pharmacist if you have questions. COMMON BRAND NAME(S): Vidaza What should I tell my care team before I take this medication? They need to know if you have any of these conditions: Kidney disease Liver disease Low blood cell levels, such as low white cells, platelets, or red blood cells Low levels of albumin in the blood Low levels of bicarbonate in the blood An unusual or allergic reaction to azacitidine, mannitol, other medications, foods, dyes, or preservatives If you or your partner are pregnant or trying to get pregnant Breast-feeding How should I use this medication? This medication is injected into a vein or under the skin. It is given by your care team in a hospital or clinic setting. Talk to your care team about the use of this medication in children. While it may be prescribed for children as young as 1 month for selected conditions, precautions do apply. Overdosage: If you think you have taken too much of this medicine contact a poison control center or emergency room at once. NOTE: This medicine is only for you. Do not share this medicine with others. What if I miss a dose? Keep appointments for follow-up doses. It is important not to miss your dose. Call your care team if you are unable to keep an appointment. What may interact with this medication? Interactions are not expected. This list may not describe all possible interactions. Give your health care provider a list of all the medicines, herbs, non-prescription drugs, or dietary supplements you use. Also tell them if you smoke, drink alcohol, or use illegal drugs. Some items may interact with your medicine. What should I watch for while using this medication? Your condition will  be monitored carefully while you are receiving this medication. You may need blood work while taking this medication. This medication may make you feel generally unwell. This is not uncommon as chemotherapy can affect healthy cells as well as cancer cells. Report any side effects. Continue your course of treatment even though you feel ill unless your care team tells you to stop. Other product types may be available that contain the medication azacitidine. The injection and oral products should not be used in place of one another. Talk to your care team if you have questions. This medication can cause serious side effects. To reduce the risk, your care team may give you other medications to take before receiving this one. Be sure to follow the directions from your care team. This medication may increase your risk of getting an infection. Call your care team for advice if you get a fever, chills, sore throat, or other symptoms of a cold or flu. Do not treat yourself. Try to avoid being around people who are sick. Avoid taking medications that contain aspirin, acetaminophen, ibuprofen, naproxen, or ketoprofen unless instructed by your care team. These medications may hide a fever. This medication may increase your risk to bruise or bleed. Call your care team if you notice any unusual bleeding. Be careful brushing or flossing your teeth or using a toothpick because you may get an infection or bleed more easily. If you have any dental work done, tell your dentist you are receiving this medication. Talk to your care team if you or your partner may be pregnant. Serious  birth defects can occur if you take this medication during pregnancy and for 6 months after the last dose. You will need a negative pregnancy test before starting this medication. Contraception is recommended while taking his medication and for 6 months after the last dose. Your care team can help you find the option that works for you. If your  partner can get pregnant, use a condom during sex while taking this medication and for 3 months after the last dose. Do not breastfeed while taking this medication and for 1 week after the last dose. This medication may cause infertility. Talk to your care team if you are concerned about your fertility. What side effects may I notice from receiving this medication? Side effects that you should report to your care team as soon as possible: Allergic reactions--skin rash, itching, hives, swelling of the face, lips, tongue, or throat Infection--fever, chills, cough, sore throat, wounds that don't heal, pain or trouble when passing urine, general feeling of discomfort or being unwell Kidney injury--decrease in the amount of urine, swelling of the ankles, hands, or feet Liver injury--right upper belly pain, loss of appetite, nausea, light-colored stool, dark yellow or brown urine, yellowing skin or eyes, unusual weakness or fatigue Low red blood cell level--unusual weakness or fatigue, dizziness, headache, trouble breathing Tumor lysis syndrome (TLS)--nausea, vomiting, diarrhea, decrease in the amount of urine, dark urine, unusual weakness or fatigue, confusion, muscle pain or cramps, fast or irregular heartbeat, joint pain Unusual bruising or bleeding Side effects that usually do not require medical attention (report to your care team if they continue or are bothersome): Constipation Diarrhea Nausea Pain, redness, or irritation at injection site Vomiting This list may not describe all possible side effects. Call your doctor for medical advice about side effects. You may report side effects to FDA at 1-800-FDA-1088. Where should I keep my medication? This medication is given in a hospital or clinic. It will not be stored at home. NOTE: This sheet is a summary. It may not cover all possible information. If you have questions about this medicine, talk to your doctor, pharmacist, or health care  provider.  2023 Elsevier/Gold Standard (2022-04-27 00:00:00)

## 2023-01-26 NOTE — Progress Notes (Signed)
ASLIN, FARINAS ([id number]) [id number] Page 1 of 9 Visit Report for 01/26/2023 Arrival Information Details Patient Name: Date of Service: [name], [name] 01/26/2023 9:15 A M Medical Record Number: [id number] Patient Account Number: [id number] Date of Birth/Sex: Treating RN: 1946/12/08 (77 y.o. [name] Primary Care Diasia Henken: Judie Bonus INE Other Clinician: Referring Santiago Stenzel: Treating Avalene Sealy/Extender: Lacie Draft, ELA INE Weeks in Treatment: 18 Visit Information History Since Last Visit Added or deleted any medications: No Patient Arrived: Wheel Chair Any new allergies or adverse reactions: No Arrival Time: 09:19 Had a fall or experienced change in No Accompanied By: self activities of daily living that may affect Transfer Assistance: Manual risk of falls: Patient Identification Verified: Yes Signs or symptoms of abuse/neglect since last visito No Secondary Verification Process Completed: Yes Hospitalized since last visit: No Patient Requires Transmission-Based Precautions: No Implantable device outside of the clinic excluding No Patient Has Alerts: No cellular tissue based products placed in the center since last visit: Has Dressing in Place as Prescribed: Yes Pain Present Now: No Electronic Signature(s) Signed: 01/26/2023 4:41:03 PM By: Adline Peals Entered By: Adline Peals on 01/26/2023 09:19:53 -------------------------------------------------------------------------------- Encounter Discharge Information Details Patient Name: Date of Service: Sherry Masson RO N [name]. 01/26/2023 9:15 A M Medical Record Number: [id number] Patient Account Number: [id number] Date of Birth/Sex: Treating RN: 10/20/1946 (77 y.o. [name] Primary Care Lindaann Gradilla: Judie Bonus INE Other Clinician: Referring Holden Maniscalco: Treating Jenea Dake/Extender: Lacie Draft, ELA INE Weeks in Treatment: 97 Encounter  Discharge Information Items Post Procedure Vitals Discharge Condition: Stable Temperature (F): 97.6 Ambulatory Status: Wheelchair Pulse (bpm): 73 Discharge Destination: Home Respiratory Rate (breaths/min): 16 Transportation: Private Auto Blood Pressure (mmHg): 130/75 Accompanied By: self Schedule Follow-up Appointment: Yes Clinical Summary of Care: Patient Declined Electronic Signature(s) Signed: 01/26/2023 4:41:03 PM By: Adline Peals Entered By: Adline Peals on 01/26/2023 10:29:00 [place], [name] ([id number]) [id number] Page 2 of 9 -------------------------------------------------------------------------------- Lower Extremity Assessment Details Patient Name: Date of Service: KETRA, DUCHESNE 01/26/2023 9:15 A M Medical Record Number: [id number] Patient Account Number: [id number] Date of Birth/Sex: Treating RN: 08/01/1946 (77 y.o. [name] Primary Care Mette Southgate: Judie Bonus INE Other Clinician: Referring Daison Braxton: Treating Jaiah Weigel/Extender: Lacie Draft, ELA INE Weeks in Treatment: 18 Edema Assessment Assessed: [Left: No] [Right: No] Edema: [Left: N] [Right: o] Calf Left: Right: Point of Measurement: From Medial Instep 27.5 cm Ankle Left: Right: Point of Measurement: From Medial Instep 21 cm Vascular Assessment Pulses: Dorsalis Pedis Palpable: [Right:Yes] Electronic Signature(s) Signed: 01/26/2023 4:41:03 PM By: Adline Peals Entered By: Adline Peals on 01/26/2023 09:28:27 -------------------------------------------------------------------------------- Multi Wound Chart Details Patient Name: Date of Service: Sherry Masson RO N [name]. 01/26/2023 9:15 A M Medical Record Number: [id number] Patient Account Number: [id number] Date of Birth/Sex: Treating RN: March 24, 1946 (77 y.o. F) Primary Care Mahek Schlesinger: Judie Bonus INE Other Clinician: Referring Danille Oppedisano: Treating Jasha Hodzic/Extender: Lacie Draft, ELA INE Weeks in Treatment: 18 Vital Signs Height(in): 64 Pulse(bpm): 73 Weight(lbs): 111 Blood Pressure(mmHg): 130/75 Body Mass Index(BMI): 19.1 Temperature(F): 97.6 Respiratory Rate(breaths/min): 16 [1:Photos:] [N/A:N/A [id number] Page 3 of 9] Right, Dorsal Foot Right, Medial Lower Leg N/A Wound Location: Blister Laceration N/A Wounding Event: Lymphedema Trauma, Other N/A Primary Etiology: Asthma, Chronic Obstructive Asthma, Chronic Obstructive N/A Comorbid History: Pulmonary Disease (COPD), Pulmonary Disease (COPD), Congestive Heart Failure, Congestive Heart Failure, Hypertension, Rheumatoid Arthritis, Hypertension, Rheumatoid Arthritis, Neuropathy, Received Chemotherapy Neuropathy, Received Chemotherapy 08/23/2022 12/18/2022 N/A Date Acquired: 18 4 N/A Weeks of Treatment: Open Open N/A Wound Status:  No No N/A Wound Recurrence: 0.5x0.5x0.1 1.7x1.5x0.1 N/A Measurements L x W x D (cm) 0.196 2.003 N/A A (cm) : rea 0.02 0.2 N/A Volume (cm) : 81.10% 81.50% N/A % Reduction in A rea: 96.80% 81.50% N/A % Reduction in Volume: Full Thickness Without Exposed Full Thickness Without Exposed N/A Classification: Support Structures Support Structures Medium Medium N/A Exudate A mount: Serosanguineous Serosanguineous N/A Exudate Type: red, brown red, brown N/A Exudate Color: Distinct, outline attached Distinct, outline attached N/A Wound Margin: Large (67-100%) Large (67-100%) N/A Granulation A mount: Pink Red N/A Granulation Quality: Small (1-33%) Small (1-33%) N/A Necrotic A mount: Fat Layer (Subcutaneous Tissue): Yes Fat Layer (Subcutaneous Tissue): Yes N/A Exposed Structures: Fascia: No Fascia: No Tendon: No Tendon: No Muscle: No Muscle: No Joint: No Joint: No Bone: No Bone: No Small (1-33%) Medium (34-66%) N/A Epithelialization: Debridement - Selective/Open Wound Debridement - Selective/Open Wound  N/A Debridement: Pre-procedure Verification/Time Out 09:36 09:36 N/A Taken: Lidocaine 4% T opical Solution Lidocaine 4% Topical Solution N/A Pain Control: [employer] N/A Tissue Debrided: Non-Viable Tissue Non-Viable Tissue N/A Level: 0.25 2.55 N/A Debridement A (sq cm): rea Curette Curette N/A Instrument: Minimum Minimum N/A Bleeding: Silver Nitrate Pressure N/A Hemostasis A chieved: Procedure was tolerated well Procedure was tolerated well N/A Debridement Treatment Response: 0.5x0.5x0.1 1.7x1.5x0.1 N/A Post Debridement Measurements L x W x D (cm) 0.02 0.2 N/A Post Debridement Volume: (cm) No Abnormalities Noted No Abnormalities Noted N/A Periwound Skin Texture: Dry/Scaly: Yes No Abnormalities Noted N/A Periwound Skin Moisture: Erythema: No Hemosiderin Staining: Yes N/A Periwound Skin Color: No Abnormality No Abnormality N/A Temperature: Debridement Debridement N/A Procedures Performed: Treatment Notes Electronic Signature(s) Signed: 01/26/2023 9:39:34 AM By: Fredirick Maudlin MD FACS Entered By: Fredirick Maudlin on 01/26/2023 09:39:34 -------------------------------------------------------------------------------- Multi-Disciplinary Care Plan Details Patient Name: Date of Service: Sherry Masson RO N [name]. 01/26/2023 9:15 A M Medical Record Number: [id number] Patient Account Number: [id number] Date of Birth/Sex: Treating RN: [birth year] (77 y.o. [name] Primary Care Chancelor Hardrick: Judie Bonus INE Other Clinician: Referring Willodene Stallings: Treating Shelda Truby/Extender: Lacie Draft, ELA INE Weeks in Treatment: [address], [place] [name] ([id number]) [id number] Page 4 of 9 Peripheral Neuropathy Nursing Diagnoses: Potential alteration in peripheral tissue perfusion (select prior to confirmation of diagnosis) Goals: Patient/caregiver will verbalize understanding of disease process and disease management Date Initiated:  09/20/2022 Target Resolution Date: 03/25/2023 Goal Status: Active Interventions: Provide education on Management of Neuropathy and Related Ulcers Treatment Activities: Patient referred for customized footwear/offloading : 09/20/2022 Notes: Wound/Skin Impairment Nursing Diagnoses: Knowledge deficit related to ulceration/compromised skin integrity Goals: Ulcer/skin breakdown will have a volume reduction of 30% by week 4 Date Initiated: 09/20/2022 Date Inactivated: 11/29/2022 Target Resolution Date: 11/30/2022 Goal Status: Met Ulcer/skin breakdown will have a volume reduction of 50% by week 8 Date Initiated: 11/29/2022 Target Resolution Date: 03/25/2023 Goal Status: Active Interventions: Assess ulceration(s) every visit Treatment Activities: Skin care regimen initiated : 09/20/2022 Topical wound management initiated : 09/20/2022 Notes: Electronic Signature(s) Signed: 01/26/2023 4:41:03 PM By: Adline Peals Entered By: Adline Peals on 01/26/2023 09:35:26 -------------------------------------------------------------------------------- Pain Assessment Details Patient Name: Date of Service: Sherry Masson RO N [name]. 01/26/2023 9:15 A M Medical Record Number: [id number] Patient Account Number: [id number] Date of Birth/Sex: Treating RN: October 26, 1946 (77 y.o. [name] Primary Care Katelyn Broadnax: Judie Bonus INE Other Clinician: Referring Jakorey Mcconathy: Treating Braxtin Bamba/Extender: Lacie Draft, ELA INE Weeks in Treatment: 18 Active Problems Location of Pain Severity and Description of Pain Patient Has Paino No Site Locations Rate the pain. [name], [name]  Lemmie Evens ([id number]) [id number] Page 5 of 9 Rate the pain. Current Pain Level: 0 Pain Management and Medication Current Pain Management: Electronic Signature(s) Signed: 01/26/2023 4:41:03 PM By: Adline Peals Entered By: Adline Peals on 01/26/2023  09:22:28 -------------------------------------------------------------------------------- Patient/Caregiver Education Details Patient Name: Date of Service: [name] 2/2/2024andnbsp9:15 A M Medical Record Number: [id number] Patient Account Number: [id number] Date of Birth/Gender: Treating RN: 10-Mar-1946 (77 y.o. [name] Primary Care Physician: Judie Bonus INE Other Clinician: Referring Physician: Treating Physician/Extender: Lacie Draft, ELA INE Weeks in Treatment: 24 Education Assessment Education Provided To: Patient Education Topics Provided Wound/Skin Impairment: Methods: Explain/Verbal Responses: Reinforcements needed, State content correctly Electronic Signature(s) Signed: 01/26/2023 4:41:03 PM By: Adline Peals Entered By: Adline Peals on 01/26/2023 09:35:43 -------------------------------------------------------------------------------- Wound Assessment Details Patient Name: Date of Service: Sherry Masson RO N [name]. 01/26/2023 9:15 A M Medical Record Number: [id number] Patient Account Number: [id number] Date of Birth/Sex: Treating RN: 1946/12/06 (77 y.o. [name] Primary Care Analis Distler: Marcy Panning Other Clinician: Referring Clois Treanor: Treating Grey Schlauch/Extender: Lacie Draft, ELA INE [place], [place] ([id number]) [id number] Page 6 of 9 Weeks in Treatment: 18 Wound Status Wound Number: 1 Primary Lymphedema Etiology: Wound Location: Right, Dorsal Foot Wound Open Wounding Event: Blister Status: Date Acquired: 08/23/2022 Comorbid Asthma, Chronic Obstructive Pulmonary Disease (COPD), Weeks Of Treatment: 18 History: Congestive Heart Failure, Hypertension, Rheumatoid Arthritis, Clustered Wound: No Neuropathy, Received Chemotherapy Photos Wound Measurements Length: (cm) 0.5 Width: (cm) 0.5 Depth: (cm) 0.1 Area: (cm) 0.196 Volume: (cm) 0.02 % Reduction in Area:  81.1% % Reduction in Volume: 96.8% Epithelialization: Small (1-33%) Tunneling: No Undermining: No Wound Description Classification: Full Thickness Without Exposed Support Structures Wound Margin: Distinct, outline attached Exudate Amount: Medium Exudate Type: Serosanguineous Exudate Color: red, brown Foul Odor After Cleansing: No Slough/Fibrino Yes Wound Bed Granulation Amount: Large (67-100%) Exposed Structure Granulation Quality: Pink Fascia Exposed: No Necrotic Amount: Small (1-33%) Fat Layer (Subcutaneous Tissue) Exposed: Yes Necrotic Quality: Adherent Slough Tendon Exposed: No Muscle Exposed: No Joint Exposed: No Bone Exposed: No Periwound Skin Texture Texture Color No Abnormalities Noted: Yes No Abnormalities Noted: No Erythema: No Moisture No Abnormalities Noted: No Temperature / Pain Dry / Scaly: Yes Temperature: No Abnormality Treatment Notes Wound #1 (Foot) Wound Laterality: Dorsal, Right Cleanser Soap and Water Discharge Instruction: May shower and wash wound with dial antibacterial soap and water prior to dressing change. Wound Cleanser Discharge Instruction: Cleanse the wound with wound cleanser prior to applying a clean dressing using gauze sponges, not tissue or cotton balls. Peri-Wound Care Topical Primary Dressing Promogran Prisma Matrix, 4.34 (sq in) (silver collagen) Discharge Instruction: Moisten collagen with saline or hydrogel Secondary Dressing [name], [name] ([id number]) [id number] Page 7 of 9 Woven Gauze Sponge, Non-Sterile 4x4 in Discharge Instruction: Apply over primary dressing as directed. Zetuvit Plus Silicone Border Dressing 4x4 (in/in) Discharge Instruction: Apply silicone border over primary dressing as directed. Secured With Compression Wrap Compression Stockings Environmental education officer) Signed: 01/26/2023 4:41:03 PM By: Adline Peals Entered By: Adline Peals on 01/26/2023  09:31:38 -------------------------------------------------------------------------------- Wound Assessment Details Patient Name: Date of Service: [name], Sherry RO N [name]. 01/26/2023 9:15 A M Medical Record Number: [id number] Patient Account Number: [id number] Date of Birth/Sex: Treating RN: 04-Aug-1946 (78 y.o. [name] Primary Care Quincy Prisco: Judie Bonus INE Other Clinician: Referring Lerlene Treadwell: Treating Zyann Mabry/Extender: Lacie Draft, ELA INE Weeks in Treatment: 18 Wound Status Wound Number: 3 Primary Trauma, Other Etiology: Wound Location: Right, Medial Lower Leg Wound Open Wounding Event: Laceration Status:  Date Acquired: 12/18/2022 Comorbid Asthma, Chronic Obstructive Pulmonary Disease (COPD), Weeks Of Treatment: 4 History: Congestive Heart Failure, Hypertension, Rheumatoid Arthritis, Clustered Wound: No Neuropathy, Received Chemotherapy Photos Wound Measurements Length: (cm) 1.7 Width: (cm) 1.5 Depth: (cm) 0.1 Area: (cm) 2.003 Volume: (cm) 0.2 % Reduction in Area: 81.5% % Reduction in Volume: 81.5% Epithelialization: Medium (34-66%) Tunneling: No Undermining: No Wound Description Classification: Full Thickness Without Exposed Support Structures Wound Margin: Distinct, outline attached Exudate Amount: Medium Exudate Type: Serosanguineous Exudate Color: red, brown Foul Odor After Cleansing: No Slough/Fibrino Yes Wound Bed Granulation Amount: Large (67-100%) Exposed Structure Granulation Quality: Red Fascia Exposed: No Necrotic Amount: Small (1-33%) Fat Layer (Subcutaneous Tissue) Exposed: Yes [name], [name] ([id number]) [phone] Page 8 of 9 Necrotic Quality: Adherent Slough Tendon Exposed: No Muscle Exposed: No Joint Exposed: No Bone Exposed: No Periwound Skin Texture Texture Color No Abnormalities Noted: Yes No Abnormalities Noted: No Hemosiderin Staining: Yes Moisture No Abnormalities Noted: Yes  Temperature / Pain Temperature: No Abnormality Treatment Notes Wound #3 (Lower Leg) Wound Laterality: Right, Medial Cleanser Soap and Water Discharge Instruction: May shower and wash wound with dial antibacterial soap and water prior to dressing change. Wound Cleanser Discharge Instruction: Cleanse the wound with wound cleanser prior to applying a clean dressing using gauze sponges, not tissue or cotton balls. Peri-Wound Care Topical Primary Dressing Sorbalgon AG Dressing 2x2 (in/in) Discharge Instruction: Apply to wound bed as instructed Secondary Dressing ABD Pad, 8x10 Discharge Instruction: Apply over primary dressing as directed. Secured With Elastic Bandage 4 inch (ACE bandage) Discharge Instruction: Secure with ACE bandage as directed. Kerlix Roll Sterile, 4.5x3.1 (in/yd) Discharge Instruction: Secure with Kerlix as directed. Compression Wrap Compression Stockings Add-Ons Electronic Signature(s) Signed: 01/26/2023 4:41:03 PM By: Adline Peals Entered By: Adline Peals on 01/26/2023 09:31:58 -------------------------------------------------------------------------------- Vitals Details Patient Name: Date of Service: Sherry Masson RO N [name]. 01/26/2023 9:15 A M Medical Record Number: [id number] Patient Account Number: [id number] Date of Birth/Sex: Treating RN: Apr 04, 1946 (76 y.o. [name] Primary Care Darelle Kings: Judie Bonus INE Other Clinician: Referring Taym Twist: Treating Mirely Pangle/Extender: Lacie Draft, ELA INE Weeks in Treatment: 18 Vital Signs Time Taken: 09:22 Temperature (F): 97.6 Height (in): 64 Pulse (bpm): 73 Weight (lbs): 111 Respiratory Rate (breaths/min): 16 Body Mass Index (BMI): 19.1 Blood Pressure (mmHg): 130/75 Reference Range: 80 - 120 mg / dl [name], [name] ([id number]) [id number].pdf Page 9 of 9 Electronic Signature(s) Signed: 01/26/2023 4:41:03 PM By: Adline Peals Entered By:  Adline Peals on 01/26/2023 09:23:02

## 2023-01-26 NOTE — Progress Notes (Signed)
Sherry, [name] ([id number]) [id number] Page 1 of 11 Visit Report for 01/26/2023 Chief Complaint Document Details Patient Name: Date of Service: [name], [name] 01/26/2023 9:15 A M Medical Record Number: [id number] Patient Account Number: [id number] Date of Birth/Sex: Treating RN: 04-Jul-1946 (77 y.o. F) Primary Care Provider: Marcy Panning Other Clinician: Referring Provider: Treating Provider/Extender: Lacie Draft, ELA INE Weeks in Treatment: 19 Information Obtained from: Patient Chief Complaint Patient seen for complaints of Non-Healing Wounds. Electronic Signature(s) Signed: 01/26/2023 9:39:46 AM By: Fredirick Maudlin MD FACS Entered By: Fredirick Maudlin on 01/26/2023 09:39:46 -------------------------------------------------------------------------------- Debridement Details Patient Name: Date of Service: [name]. 01/26/2023 9:15 A M Medical Record Number: [id number] Patient Account Number: [id number] Date of Birth/Sex: Treating RN: August 05, 1946 (77 y.o. [name] Primary Care Provider: Judie Bonus INE Other Clinician: Referring Provider: Treating Provider/Extender: Lacie Draft, ELA INE Weeks in Treatment: 18 Debridement Performed for Assessment: Wound #3 Right,Medial Lower Leg Performed By: Physician Fredirick Maudlin, MD Debridement Type: Debridement Level of Consciousness (Pre-procedure): Awake and Alert Pre-procedure Verification/Time Out Yes - 09:36 Taken: Start Time: 09:36 Pain Control: Lidocaine 4% T opical Solution T Area Debrided (L x W): otal 1.7 (cm) x 1.5 (cm) = 2.55 (cm) Tissue and other material debrided: Non-Viable, Slough, Slough Level: Non-Viable Tissue Debridement Description: Selective/Open Wound Instrument: Curette Bleeding: Minimum Hemostasis Achieved: Pressure Response to Treatment: Procedure was tolerated well Level of Consciousness (Post- Awake and  Alert procedure): Post Debridement Measurements of Total Wound Length: (cm) 1.7 Width: (cm) 1.5 Depth: (cm) 0.1 Volume: (cm) 0.2 Character of Wound/Ulcer Post Debridement: Improved Post Procedure Diagnosis Same as Pre-procedure [name], [name] [name] ([id number]) [phone].pdf Page 2 of 11 Notes scribed for Dr. [name] by Adline Peals, RN Electronic Signature(s) Signed: 01/26/2023 9:43:37 AM By: Fredirick Maudlin MD FACS Signed: 01/26/2023 4:41:03 PM By: Sabas Sous By: Adline Peals on 01/26/2023 09:37:01 -------------------------------------------------------------------------------- Debridement Details Patient Name: Date of Service: [name]. 01/26/2023 9:15 A M Medical Record Number: [id number] Patient Account Number: [id number] Date of Birth/Sex: Treating RN: May 18, 1946 (77 y.o. [name] Primary Care Provider: Judie Bonus INE Other Clinician: Referring Provider: Treating Provider/Extender: Lacie Draft, ELA INE Weeks in Treatment: 18 Debridement Performed for Assessment: Wound #1 Right,Dorsal Foot Performed By: Physician Fredirick Maudlin, MD Debridement Type: Debridement Level of Consciousness (Pre-procedure): Awake and Alert Pre-procedure Verification/Time Out Yes - 09:36 Taken: Start Time: 09:36 Pain Control: Lidocaine 4% T opical Solution T Area Debrided (L x W): otal 0.5 (cm) x 0.5 (cm) = 0.25 (cm) Tissue and other material debrided: Non-Viable, Slough, Slough Level: Non-Viable Tissue Debridement Description: Selective/Open Wound Instrument: Curette Bleeding: Minimum Hemostasis Achieved: Silver Nitrate Response to Treatment: Procedure was tolerated well Level of Consciousness (Post- Awake and Alert procedure): Post Debridement Measurements of Total Wound Length: (cm) 0.5 Width: (cm) 0.5 Depth: (cm) 0.1 Volume: (cm) 0.02 Character of Wound/Ulcer Post Debridement: Improved Post  Procedure Diagnosis Same as Pre-procedure Notes scribed for Dr. [name] by Adline Peals, RN Electronic Signature(s) Signed: 01/26/2023 9:43:37 AM By: Fredirick Maudlin MD FACS Signed: 01/26/2023 4:41:03 PM By: Adline Peals Entered By: Adline Peals on 01/26/2023 09:37:50 -------------------------------------------------------------------------------- HPI Details Patient Name: Date of Service: [name]. 01/26/2023 9:15 A M Medical Record Number: [id number] Patient Account Number: [id number] Date of Birth/Sex: Treating RN: 1946/09/07 (77 y.o. F) Primary Care Provider: Marcy Panning Other Clinician: [name], [name] ([id number]) [id number] Page 3 of 11 Referring Provider: Treating Provider/Extender: Lacie Draft, ELA  INE Weeks in Treatment: 18 History of Present Illness HPI Description: ADMISSION 09/20/2022 This is a 77 year old woman with a past medical history significant for congestive heart failure, atrial fibrillation, rheumatoid arthritis on long-term steroid treatment, CMML currently receiving chemotherapy and moderate malnutrition. She presents to clinic today with a wound on the dorsum of her right foot. She says it started out as a bruise and then developed a bump which subsequently broke open. She says she was sent to our clinic by her oncologist. She is not diabetic and does not smoke. ABI in clinic today was 1.55. She has had formal segmental arterial Dopplers done, a little over a year ago, which did not show any evidence of occlusive vascular disease. On the dorsal aspect of the right foot, there is a wound with a lot of old hematoma, slough, eschar, and and nonviable subcutaneous tissue. T the best of my o ability to discern, it does appear to involve the muscle layer, but there is no evidence of necrosis. The wound is pouring serous fluid; the patient has 3+ pitting edema to the bilateral lower  extremities. 09/29/2022: The patient came to clinic today with nothing but a T overlying her wound. She is accompanied by her husband today. She has not elfa communicated with her cardiologist regarding her fluid overload. She still has 3+ pitting edema bilaterally. The wound is still pouring serous fluid, although not quite as effusively as at her initial visit. There is still quite a bit of nonviable tissue and hematoma present. 10/13; patient's wound on the right lateral lower leg his eschared over and may well be on its way to healing. The real problem here is on the right dorsal foot just proximal to her toes. The patient is still puzzled by the cause of this. Looking at a picture on the patient's smart phone there is a suggestion that this might have been a hematoma which seems to fit what Dr. [name] had felt on her initial evaluation. They have been using iodoform packing kerlix and Coban. 10/20; right lower leg is healed. We are approved for snap VAC on the right foot. We will try to arrange home health but until then she is going to need a nurse visit on Tuesday 10/20/2022: It has been 2 weeks since I have seen the wound. It has improved significantly since then. She is currently in a snap VAC and I think this has made quite a difference. The overall wound measurements are smaller. There is no massive drainage, as there had been previously. There is some good granulation tissue forming underneath a bit of slough. 10/30/2022: The wound continues to improve with use of the snap VAC. The swelling in her foot is better. The undermining continues to close in. There is a layer of slough overlying good granulation tissue. 11/06/2022: The wound has contracted further. The undermining is still present but the wound surface is better and her edema control is good. 11/13/2022: The wound is smaller again and the undermining has decreased as well. There is slough accumulation over a nice bed of granulation  tissue. 11/20/2022: We did not have any snap VAC devices in clinic last week so the wound was just packed. It did well and is smaller with less undermining today. There is still slough present on the surface. 11/29/2022: The wound has contracted considerably and the undermining has resolved. There is still some slough on the wound surface. 12/13; slough on the surface of the wound required debridement however afterwards the  granulation looks healthy the wound has been contracting. She has been using a snap VAC with collagen 12/13/2022: The wound was too small and superficial last week and the snap VAC was discontinued. They have been using Hydrofera Blue due to the hypertrophic granulation tissue. The wound has a little bit of slough on the surface. 12/28/2022: The wound on her dorsal foot continues to contract and is quite superficial at this point. There is a little bit of slough on the surface. Unfortunately, she struck her right medial leg on a hard surface on Christmas, resulting in a laceration. It was sutured in the emergency room. She just came from having the sutures removed. Overall, I do not think the skin is likely to survive, but for now there is no grossly open ulcer at this site. 01/03/2023: Her edema is fairly significant today and not at all well-controlled. The dorsal foot wound appears deeper, but this may be an illusion secondary to the swelling. There is a fair amount of slough accumulation in the wound. The medial leg wound, as expected, shows signs of skin necrosis and partial dehiscence. There is hematoma and necrotic fat visible. 01/10/2023: She removed her wrap about 4 days ago; despite this, both wounds are smaller and cleaner. There is still slough accumulation in both sites. 01/18/2023: She came in with her wrap on today but once it was removed, her leg began to visibly swell; it was quite an interesting phenomenon to observe. I suspect that she has poor oncotic pressure due to  her low albumin. As result, her edema control is suboptimal. Both wounds are smaller with just a little bit of slough present. 01/26/2023: Both wounds are considerably smaller today. Light slough on both surfaces. Small amount of hypertrophic granulation tissue on the dorsal foot wound. Electronic Signature(s) Signed: 01/26/2023 9:40:18 AM By: Fredirick Maudlin MD FACS Entered By: Fredirick Maudlin on 01/26/2023 09:40:17 -------------------------------------------------------------------------------- Physical Exam Details Patient Name: Date of Service: [name]. 01/26/2023 9:15 A M Medical Record Number: [id number] Patient Account Number: [id number] Date of Birth/Sex: Treating RN: [birth year] (77 y.o. Peta Peachey, Jensine [name] ([id number]) [id number] Page 4 of 11 Primary Care Provider: Marcy Panning Other Clinician: Referring Provider: Treating Provider/Extender: Lacie Draft, ELA INE Weeks in Treatment: 18 Constitutional . . . . no acute distress. Respiratory Normal work of breathing on room air. Notes 01/26/2023: Both wounds are considerably smaller today. Light slough on both surfaces. Small amount of hypertrophic granulation tissue on the dorsal foot wound. Electronic Signature(s) Signed: 01/26/2023 9:40:44 AM By: Fredirick Maudlin MD FACS Entered By: Fredirick Maudlin on 01/26/2023 09:40:44 -------------------------------------------------------------------------------- Physician Orders Details Patient Name: Date of Service: [name]. 01/26/2023 9:15 A M Medical Record Number: [id number] Patient Account Number: [id number] Date of Birth/Sex: Treating RN: 18-May-1946 (77 y.o. 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 - Dr. [name] Room 4 Return A Anesthetic (In clinic) Topical Lidocaine 4% applied to  wound bed Bathing/ Shower/ Hygiene Other Bathing/Shower/Hygiene Orders/Instructions: - Please do not get LEG WRAPS on Right leg wet. Sponge bathe or use a cast protector on right leg if showering/bathing. Edema Control - Lymphedema / SCD / Other Left Lower Extremity Avoid standing for long periods of time. Patient to wear own compression stockings every day. Compression stocking or Garment 20-30 mm/Hg pressure to: - left leg Wound Treatment Wound #1 - Foot Wound Laterality: Dorsal, Right Cleanser: Soap and Water Every Other Day/15 Days Discharge Instructions: May shower and wash wound with dial antibacterial soap and water prior to dressing change. Cleanser: Wound Cleanser (Generic) Every Other Day/15 Days Discharge Instructions: Cleanse the wound with wound cleanser prior to applying a clean dressing using gauze sponges, not tissue or cotton balls. Prim Dressing: Promogran Prisma Matrix, 4.34 (sq in) (silver collagen) ary Every Other Day/15 Days [name], [name] ([id number]) [id number].pdf Page 5 of 11 Discharge Instructions: Moisten collagen with saline or hydrogel Secondary Dressing: Woven Gauze Sponge, Non-Sterile 4x4 in (Generic) Every Other Day/15 Days Discharge Instructions: Apply over primary dressing as directed. Secondary Dressing: Zetuvit Plus Silicone Border  Dressing 4x4 (in/in) (Generic) Every Other Day/15 Days Discharge Instructions: Apply silicone border over primary dressing as directed. Wound #3 - Lower Leg Wound Laterality: Right, Medial Cleanser: Soap and Water 1 x Per Week/30 Days Discharge Instructions: May shower and wash wound with dial antibacterial soap and water prior to dressing change. Cleanser: Wound Cleanser 1 x Per Week/30 Days Discharge Instructions: Cleanse the wound with wound cleanser prior to applying a clean dressing using gauze sponges, not tissue or cotton balls. Prim Dressing: Sorbalgon AG Dressing 2x2 (in/in) 1 x Per Week/30 Days ary Discharge Instructions: Apply to wound bed as instructed Secondary Dressing: ABD Pad, 8x10 1 x Per Week/30 Days Discharge Instructions: Apply over primary dressing as directed. Secured With: Elastic Bandage 4 inch (ACE bandage) 1 x Per Week/30 Days Discharge Instructions: Secure with ACE bandage as directed. Secured With: [employer], 4.5x3.1 (in/yd) 1 x Per Week/30 Days Discharge Instructions: Secure with Kerlix as directed. Patient Medications llergies: penicillin, Plaquenil, digoxin, hydroxychloroquine, alendronate sodium, gluten A Notifications Medication Indication Start End 01/26/2023 lidocaine DOSE topical 4 % cream - cream topical Electronic Signature(s) Signed: 01/26/2023 9:43:37 AM By: Fredirick Maudlin MD FACS Entered By: Fredirick Maudlin on 01/26/2023 09:41:05 -------------------------------------------------------------------------------- Problem List Details Patient Name: Date of Service: [name]. 01/26/2023 9:15 A M Medical Record Number: [id number] Patient Account Number: [id number] Date of Birth/Sex: Treating RN: 1946-06-03 (77 y.o. 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 09/20/2022 No Yes without evidence of necrosis L97.812 Non-pressure chronic ulcer of other part of right lower leg with fat layer 12/28/2022 No Yes exposed G28.36 Chronic systolic (congestive) heart failure 09/20/2022 No Yes [place], Shantia [name] ([id number]) [phone].pdf Page 6 of 11 M06.9 Rheumatoid arthritis, unspecified 09/20/2022 No Yes Z79.52 Long term (current) use of systemic steroids 09/20/2022 No Yes I70.90 Unspecified atherosclerosis 09/20/2022 No Yes 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 09/20/2022 No Yes Inactive Problems Resolved Problems ICD-10 Code Description Active Date Resolved Date L97.812 Non-pressure chronic ulcer of other part of right lower leg with fat layer exposed 09/20/2022 09/20/2022 Electronic Signature(s) Signed: 01/26/2023 9:39:13 AM By: Fredirick Maudlin MD FACS Entered By: Fredirick Maudlin  on 01/26/2023 09:39:13 -------------------------------------------------------------------------------- Progress Note Details Patient Name: Date of Service: [name], [name] 01/26/2023 9:15 A M Medical Record Number: [id number] Patient Account Number: [id number] Date of Birth/Sex: Treating RN: 1946/01/14 (77 y.o. F) Primary Care Provider: Marcy Panning Other Clinician: Referring Provider: Treating Provider/Extender: Lacie Draft, ELA INE Weeks in Treatment: 18 Subjective Chief Complaint Information obtained from Patient Patient seen for complaints of Non-Healing Wounds. History of Present Illness (HPI) ADMISSION 09/20/2022 This is a 77 year old woman with a past medical history significant for congestive heart failure, atrial fibrillation, rheumatoid arthritis on long-term steroid treatment, CMML currently receiving chemotherapy and moderate malnutrition. She presents to clinic today with a  wound on the dorsum of her right foot. She says it started out as a bruise and then developed a bump which subsequently broke open. She says she was sent to our clinic by her oncologist. She is not diabetic and does not smoke. ABI in clinic today was 1.55. She has had formal segmental arterial Dopplers done, a little over a year ago, which did not show any evidence of occlusive vascular disease. On the dorsal aspect of the right foot, there is a wound with a lot of old hematoma, slough, eschar, and and nonviable subcutaneous tissue. T the best of my o ability to discern, it does appear to involve the muscle layer, but there is no evidence of necrosis. The wound is pouring serous fluid; the patient has 3+ pitting edema to the bilateral lower extremities. 09/29/2022: The patient came to clinic today with nothing but a T overlying her wound. She is accompanied by her husband today. She has not elfa communicated with her cardiologist regarding her fluid overload. She still has 3+ pitting edema bilaterally. The wound is still pouring serous fluid, although not quite as effusively as at her initial visit. There is still quite a bit of nonviable tissue and hematoma present. 10/13; patient's wound on the right lateral lower leg his eschared over and may well be on its way to healing. The real problem here is on the right dorsal foot just proximal to her toes. The patient is still puzzled by the cause of this. Looking at a picture on the patient's smart phone there is a suggestion that this might have been a hematoma which seems to fit what Dr. [name] had felt on her initial evaluation. They have been using iodoform packing kerlix and Coban. [name], [name] ([id number]) [id number] Page 7 of 11 10/20; right lower leg is healed. We are approved for snap VAC on the right foot. We will try to arrange home health but until then she is going to need a nurse visit on  Tuesday 10/20/2022: It has been 2 weeks since I have seen the wound. It has improved significantly since then. She is currently in a snap VAC and I think this has made quite a difference. The overall wound measurements are smaller. There is no massive drainage, as there had been previously. There is some good granulation tissue forming underneath a bit of slough. 10/30/2022: The wound continues to improve with use of the snap VAC. The swelling in her foot is better. The undermining continues to close in. There is a layer of slough overlying good granulation tissue. 11/06/2022: The wound has contracted further. The undermining is still present but the wound surface is better and her edema control is good. 11/13/2022: The wound is smaller again and the undermining has decreased as well. There is slough accumulation over  a nice bed of granulation tissue. 11/20/2022: We did not have any snap VAC devices in clinic last week so the wound was just packed. It did well and is smaller with less undermining today. There is still slough present on the surface. 11/29/2022: The wound has contracted considerably and the undermining has resolved. There is still some slough on the wound surface. 12/13; slough on the surface of the wound required debridement however afterwards the granulation looks healthy the wound has been contracting. She has been using a snap VAC with collagen 12/13/2022: The wound was too small and superficial last week and the snap VAC was discontinued. They have been using Hydrofera Blue due to the hypertrophic granulation tissue. The wound has a little bit of slough on the surface. 12/28/2022: The wound on her dorsal foot continues to contract and is quite superficial at this point. There is a little bit of slough on the surface. Unfortunately, she struck her right medial leg on a hard surface on Christmas, resulting in a laceration. It was sutured in the emergency room. She just came from having  the sutures removed. Overall, I do not think the skin is likely to survive, but for now there is no grossly open ulcer at this site. 01/03/2023: Her edema is fairly significant today and not at all well-controlled. The dorsal foot wound appears deeper, but this may be an illusion secondary to the swelling. There is a fair amount of slough accumulation in the wound. The medial leg wound, as expected, shows signs of skin necrosis and partial dehiscence. There is hematoma and necrotic fat visible. 01/10/2023: She removed her wrap about 4 days ago; despite this, both wounds are smaller and cleaner. There is still slough accumulation in both sites. 01/18/2023: She came in with her wrap on today but once it was removed, her leg began to visibly swell; it was quite an interesting phenomenon to observe. I suspect that she has poor oncotic pressure due to her low albumin. As result, her edema control is suboptimal. Both wounds are smaller with just a little bit of slough present. 01/26/2023: Both wounds are considerably smaller today. Light slough on both surfaces. Small amount of hypertrophic granulation tissue on the dorsal foot wound. Patient History Information obtained from Patient. Family History Unknown History, Diabetes - Father, Heart Disease - Father, No family history of Cancer. Social History Former smoker - quit 2003, Marital Status - Married, Alcohol Use - Daily, Drug Use - Prior History, Caffeine Use - Daily. Medical History Eyes Denies history of Cataracts, Glaucoma, Optic Neuritis Ear/Nose/Mouth/Throat Denies history of Chronic sinus problems/congestion, Middle ear problems Respiratory Patient has history of Asthma, Chronic Obstructive Pulmonary Disease (COPD) Cardiovascular Patient has history of Congestive Heart Failure, Hypertension Denies history of Coronary Artery Disease, Deep Vein Thrombosis, Hypotension, Myocardial Infarction Endocrine Denies history of Type I Diabetes, Type  II Diabetes Immunological Denies history of Lupus Erythematosus, Raynaudoos, Scleroderma Integumentary (Skin) Denies history of History of Burn Musculoskeletal Patient has history of Rheumatoid Arthritis Denies history of Gout Neurologic Patient has history of Neuropathy Oncologic Patient has history of Received Chemotherapy - currently receiving Hospitalization/Surgery History - reverse shoulder arthroplasty Rt- 2018. Medical A Surgical History Notes nd Cardiovascular AFIB Genitourinary Stage 3 CKD [name], [name] ([id number]) [id number] Page 8 of 11 Objective Constitutional no acute distress. Vitals Time Taken: 9:22 AM, Height: 64 in, Weight: 111 lbs, BMI: 19.1, Temperature: 97.6 F, Pulse: 73 bpm, Respiratory Rate: 16 breaths/min, Blood Pressure: 130/75 mmHg. Respiratory Normal work of  breathing on room air. General Notes: 01/26/2023: Both wounds are considerably smaller today. Light slough on both surfaces. Small amount of hypertrophic granulation tissue on the dorsal foot wound. Integumentary (Hair, Skin) Wound #1 status is Open. Original cause of wound was Blister. The date acquired was: 08/23/2022. The wound has been in treatment 18 weeks. The wound is located on the Right,Dorsal Foot. The wound measures 0.5cm length x 0.5cm width x 0.1cm depth; 0.196cm^2 area and 0.02cm^3 volume. There is Fat Layer (Subcutaneous Tissue) exposed. There is no tunneling or undermining noted. There is a medium amount of serosanguineous drainage noted. The wound margin is distinct with the outline attached to the wound base. There is large (67-100%) pink granulation within the wound bed. There is a small (1-33%) amount of necrotic tissue within the wound bed including Adherent Slough. The periwound skin appearance had no abnormalities noted for texture. The periwound skin appearance exhibited: Dry/Scaly. The periwound skin appearance did not exhibit: Erythema.  Periwound temperature was noted as No Abnormality. Wound #3 status is Open. Original cause of wound was Laceration. The date acquired was: 12/18/2022. The wound has been in treatment 4 weeks. The wound is located on the Right,Medial Lower Leg. The wound measures 1.7cm length x 1.5cm width x 0.1cm depth; 2.003cm^2 area and 0.2cm^3 volume. There is Fat Layer (Subcutaneous Tissue) exposed. There is no tunneling or undermining noted. There is a medium amount of serosanguineous drainage noted. The wound margin is distinct with the outline attached to the wound base. There is large (67-100%) red granulation within the wound bed. There is a small (1-33%) amount of necrotic tissue within the wound bed including Adherent Slough. The periwound skin appearance had no abnormalities noted for texture. The periwound skin appearance had no abnormalities noted for moisture. The periwound skin appearance exhibited: Hemosiderin Staining. Periwound temperature was noted as No Abnormality. 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 There was a Selective/Open Wound Non-Viable Tissue Debridement with a total area of 0.25 sq cm performed by Fredirick Maudlin, MD. With the following instrument(s): Curette to remove Non-Viable tissue/material. Material removed includes [facility] after achieving pain control using Lidocaine 4% Topical Solution. No specimens were taken. A time out was conducted at 09:36, prior to the start of the  procedure. A Minimum amount of bleeding was controlled with Silver Nitrate. The procedure was tolerated well. Post Debridement Measurements: 0.5cm length x 0.5cm width x 0.1cm depth; 0.02cm^3 volume. Character of Wound/Ulcer Post Debridement is improved. Post procedure Diagnosis Wound #1: Same as Pre-Procedure General Notes: scribed for Dr. [name] by Adline Peals, RN. Wound #3 Pre-procedure diagnosis of Wound #3 is a Trauma, Other located on the Right,Medial Lower Leg . There was a Selective/Open Wound Non-Viable Tissue Debridement with a total area of 2.55 sq cm performed by Fredirick Maudlin, MD. With the following instrument(s): Curette to remove Non-Viable tissue/material. Material removed includes [facility] after achieving pain control using Lidocaine 4% Topical Solution. No specimens were taken. A time out was conducted at 09:36, prior to the start of the procedure. A Minimum amount of bleeding was controlled with Pressure. The procedure was  tolerated well. Post Debridement Measurements: 1.7cm length x 1.5cm width x 0.1cm depth; 0.2cm^3 volume. Character of Wound/Ulcer Post Debridement is improved. Post procedure Diagnosis Wound #3: Same as Pre-Procedure General Notes: scribed for Dr. [name] by Adline Peals, RN. Plan Follow-up Appointments: Return Appointment in 1 week. - Dr. [name] Room 4 Anesthetic: [name], [name] ([id number]) [id number] Page 9 of 11 (In clinic) Topical Lidocaine 4% applied to wound bed Bathing/ Shower/ Hygiene: Other Bathing/Shower/Hygiene Orders/Instructions: - Please do not get LEG WRAPS on Right leg wet. Sponge bathe or use a cast protector on right leg if showering/bathing. Edema Control - Lymphedema / SCD / Other: Avoid standing for long periods of time. Patient to wear own compression stockings every day. Compression stocking or Garment 20-30 mm/Hg pressure to: - left leg The following medication(s) was prescribed:  lidocaine topical 4 % cream cream topical was prescribed at facility WOUND #1: - Foot Wound Laterality: Dorsal, Right Cleanser: Soap and Water Every Other Day/15 Days Discharge Instructions: May shower and wash wound with dial antibacterial soap and water prior to dressing change. Cleanser: Wound Cleanser (Generic) Every Other Day/15 Days Discharge Instructions: Cleanse the wound with wound cleanser prior to applying a clean dressing using gauze sponges, not tissue or cotton balls. Prim Dressing: Promogran Prisma Matrix, 4.34 (sq in) (silver collagen) Every Other Day/15 Days ary Discharge Instructions: Moisten collagen with saline or hydrogel Secondary Dressing: Woven Gauze Sponge, Non-Sterile 4x4 in (Generic) Every Other Day/15 Days Discharge Instructions: Apply over primary dressing as directed. Secondary Dressing: Zetuvit Plus Silicone Border Dressing 4x4 (in/in) (Generic) Every Other Day/15 Days Discharge Instructions: Apply silicone border over primary dressing as directed. WOUND #3: - Lower Leg Wound Laterality: Right, Medial Cleanser: Soap and Water 1 x Per Week/30 Days Discharge Instructions: May shower and wash wound with dial antibacterial soap and water prior to dressing change. Cleanser: Wound Cleanser 1 x Per Week/30 Days Discharge Instructions: Cleanse the wound with wound cleanser prior to applying a clean dressing using gauze sponges, not tissue or cotton balls. Prim Dressing: Sorbalgon AG Dressing 2x2 (in/in) 1 x Per Week/30 Days ary Discharge Instructions: Apply to wound bed as instructed Secondary Dressing: ABD Pad, 8x10 1 x Per Week/30 Days Discharge Instructions: Apply over primary dressing as directed. Secured With: Elastic Bandage 4 inch (ACE bandage) 1 x Per Week/30 Days Discharge Instructions: Secure with ACE bandage as directed. Secured With: [employer], 4.5x3.1 (in/yd) 1 x Per Week/30 Days Discharge Instructions: Secure with Kerlix as directed. 01/26/2023:  Both wounds are considerably smaller today. Light slough on both surfaces. Small amount of hypertrophic granulation tissue on the dorsal foot wound. I used a curette to debride slough from both of the wound sites. I also chemically cauterized the hypertrophic granulation tissue on her foot. We will continue silver alginate to the leg and silver collagen to the foot. Continue Kerlix and Ace bandaging. I am hopeful that she will be healed within the next 4 weeks. Follow-up in 1 week. Electronic Signature(s) Signed: 01/26/2023 9:41:55 AM By: Fredirick Maudlin MD FACS Entered By: Fredirick Maudlin on 01/26/2023 09:41:55 -------------------------------------------------------------------------------- HxROS Details Patient Name: Date of Service: [name]. 01/26/2023 9:15 A M Medical Record Number: [id number] Patient Account Number: [id number] Date of Birth/Sex: Treating RN: 08/29/1946 (77 y.o. F) Primary Care Provider: Marcy Panning Other Clinician: Referring Provider: Treating Provider/Extender: Lacie Draft, ELA INE Weeks in Treatment: 18 Information Obtained From Patient Eyes Medical History: Negative for: Cataracts; Glaucoma; Optic Neuritis Ear/Nose/Mouth/Throat Medical History:  Negative for: Chronic sinus problems/congestion; Middle ear problems Respiratory Tyshika, [place] Norelle [name] ([id number]) [id number] Page 10 of 11 Medical History: Positive for: Asthma; Chronic Obstructive Pulmonary Disease (COPD) Cardiovascular Medical History: Positive for: Congestive Heart Failure; Hypertension Negative for: Coronary Artery Disease; Deep Vein Thrombosis; Hypotension; Myocardial Infarction Past Medical History Notes: AFIB Endocrine Medical History: Negative for: Type I Diabetes; Type II Diabetes Genitourinary Medical History: Past Medical History Notes: Stage 3 CKD Immunological Medical History: Negative for: Lupus Erythematosus; Raynauds;  Scleroderma Integumentary (Skin) Medical History: Negative for: History of Burn Musculoskeletal Medical History: Positive for: Rheumatoid Arthritis Negative for: Gout Neurologic Medical History: Positive for: Neuropathy Oncologic Medical History: Positive for: Received Chemotherapy - currently receiving Immunizations Pneumococcal Vaccine: Received Pneumococcal Vaccination: Yes Received Pneumococcal Vaccination On or After 60th Birthday: Yes Implantable Devices None Hospitalization / Surgery History Type of Hospitalization/Surgery reverse shoulder arthroplasty Rt- 2018 Family and Social History Unknown History: Yes; Cancer: No; Diabetes: Yes - Father; Heart Disease: Yes - Father; Former smoker - quit 2003; Marital Status - Married; Alcohol Use: Daily; Drug Use: Prior History; Caffeine Use: Daily; Financial Concerns: No; Food, Clothing or Shelter Needs: No; Support System Lacking: No; Transportation Concerns: No Electronic Signature(s) Signed: 01/26/2023 9:43:37 AM By: Fredirick Maudlin MD FACS Entered By: Fredirick Maudlin on 01/26/2023 09:40:24 [place], [name] ([id number]) [id number] Page 11 of 11 -------------------------------------------------------------------------------- SuperBill Details Patient Name: Date of Service: NAARA, KELTY 01/26/2023 Medical Record Number: [id number] Patient Account Number: [id number] Date of Birth/Sex: Treating RN: [birth year] (77 y.o. 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 Facility Procedures : CPT4 Code: [id number] Description: 80165 - DEBRIDE WOUND 1ST 20 SQ CM OR < ICD-10 Diagnosis Description 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 wit L97.812 Non-pressure chronic ulcer of other part of right lower leg with fat  layer exposed Modifier: hout evidence of n Quantity: 1 ecrosis Physician Procedures : CPT4 Code Description Modifier [id number] - WC PHYS LEVEL 3 - EST PT 25 ICD-10 Diagnosis Description 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 evidence of ne L97.812 Non-pressure chronic ulcer of  other part of right lower leg with fat layer exposed J44.92 Chronic systolic (congestive) heart failure Z79.52 Long term (current) use of systemic steroids Quantity: 1 crosis : [id number] 19758 - WC PHYS DEBR WO ANESTH 20 SQ CM ICD-10 Diagnosis Description 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

## 2023-01-29 DIAGNOSIS — M0579 Rheumatoid arthritis with rheumatoid factor of multiple sites without organ or systems involvement: Secondary | ICD-10-CM | POA: Diagnosis not present

## 2023-02-01 NOTE — Telephone Encounter (Signed)
Left voicemail to follow up with patient to ensure she was able to get Stiolto from the pharmacy. Informed patient to call back if she has any problems getting the inhaler. Closing encounter.

## 2023-02-02 ENCOUNTER — Encounter (HOSPITAL_BASED_OUTPATIENT_CLINIC_OR_DEPARTMENT_OTHER): Payer: Medicare Other | Admitting: General Surgery

## 2023-02-02 DIAGNOSIS — L97822 Non-pressure chronic ulcer of other part of left lower leg with fat layer exposed: Secondary | ICD-10-CM | POA: Diagnosis not present

## 2023-02-02 DIAGNOSIS — I130 Hypertensive heart and chronic kidney disease with heart failure and stage 1 through stage 4 chronic kidney disease, or unspecified chronic kidney disease: Secondary | ICD-10-CM | POA: Diagnosis not present

## 2023-02-02 DIAGNOSIS — L97512 Non-pressure chronic ulcer of other part of right foot with fat layer exposed: Secondary | ICD-10-CM | POA: Diagnosis not present

## 2023-02-02 DIAGNOSIS — I890 Lymphedema, not elsewhere classified: Secondary | ICD-10-CM | POA: Diagnosis not present

## 2023-02-02 DIAGNOSIS — S81801A Unspecified open wound, right lower leg, initial encounter: Secondary | ICD-10-CM | POA: Diagnosis not present

## 2023-02-02 DIAGNOSIS — J4489 Other specified chronic obstructive pulmonary disease: Secondary | ICD-10-CM | POA: Diagnosis not present

## 2023-02-02 DIAGNOSIS — L97812 Non-pressure chronic ulcer of other part of right lower leg with fat layer exposed: Secondary | ICD-10-CM | POA: Diagnosis not present

## 2023-02-02 DIAGNOSIS — L97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 evidence of necrosis: Secondary | ICD-10-CM | POA: Diagnosis not present

## 2023-02-03 NOTE — Progress Notes (Signed)
AVLEEN, ATWAL ([id number]) [id number] Page 1 of 9 Visit Report for 02/02/2023 Arrival Information Details Patient Name: Date of Service: [name], [name] 02/02/2023 10:00 A M Medical Record Number: [id number] Patient Account Number: [id number] Date of Birth/Sex: Treating RN: [birth year]-[name] (77 y.o. F) Primary Care [name]: [name] INE Other Clinician: Referring [name]: Treating [name]/Extender: [name], ELA INE Weeks in Treatment: 75 Visit Information History Since Last Visit All ordered tests and consults were completed: No Patient Arrived: Wheel Chair Added or deleted any medications: No Arrival Time: 10:36 Any new allergies or adverse reactions: No Accompanied By: self Had a fall or experienced change in No Transfer Assistance: None activities of daily living that may affect Patient Identification Verified: Yes risk of falls: Secondary Verification Process Completed: Yes Signs or symptoms of abuse/neglect since last visito No Patient Requires Transmission-Based Precautions: No Hospitalized since last visit: No Patient Has Alerts: No Implantable device outside of the clinic excluding No cellular tissue based products placed in the center since last visit: Pain Present Now: No Electronic Signature(s) Signed: 02/02/2023 11:27:31 AM By: [name] Entered By: [name] on 02/02/2023 10:37:07 -------------------------------------------------------------------------------- Encounter Discharge Information Details Patient Name: Date of Service: [name], [place]. 02/02/2023 10:00 A M Medical Record Number: [id number] Patient Account Number: [id number] Date of Birth/Sex: Treating RN: [birth year][name] (77 y.o. [name] Primary Care [name]: Marcy Panning Other Clinician: Referring [name]: Treating [name]/Extender: [name], ELA INE Weeks in Treatment: 66 Encounter Discharge Information Items Post  Procedure Vitals Discharge Condition: Stable Temperature (F): 97.8 Ambulatory Status: Wheelchair Pulse (bpm): 66 Discharge Destination: Home Respiratory Rate (breaths/min): 20 Transportation: Private Auto Blood Pressure (mmHg): 107/62 Accompanied By: spouse Schedule Follow-up Appointment: Yes Clinical Summary of Care: Patient Declined Electronic Signature(s) Signed: 02/02/2023 5:51:49 PM By: Sherry Catholic RN Entered By: [name] on 02/02/2023 17:36:[name] [name], [name] ([id number] Page 2 of 9 -------------------------------------------------------------------------------- Lower Extremity Assessment Details Patient Name: Date of Service: [name], [name] 02/02/2023 10:00 A M Medical Record Number: [id number] Patient Account Number: [id number] Date of Birth/Sex: Treating RN: [name][birth year] (77 y.o. [name], [place] Primary Care [name]: Marcy Panning Other Clinician: Referring [name]: Treating [name]/Extender: [name], ELA INE Weeks in Treatment: 19 Edema Assessment Assessed: [Left: No] [Right: No] Edema: [Left: N] [Right: o] Calf Left: Right: Point of Measurement: From Medial Instep 26 cm Ankle Left: Right: Point of Measurement: From Medial Instep 19.5 cm Vascular Assessment Pulses: Dorsalis Pedis Palpable: [Right:Yes] Electronic Signature(s) Signed: 02/02/2023 4:08:01 PM By: Sherry East RN Entered By: [name] on 02/02/2023 10:45:30 -------------------------------------------------------------------------------- Multi Wound Chart Details Patient Name: Date of Service: Sherry Masson RO N [name]. 02/02/2023 10:00 A M Medical Record Number: [id number] Patient Account Number: [id number] Date of Birth/Sex: Treating RN: [name]-[birth year] (77 y.o. F) Primary Care [name]: [name] INE Other Clinician: Referring [name]: Treating [name]/Extender: [name], ELA INE Weeks in Treatment:  19 Vital Signs Height(in): 64 Pulse(bpm): 66 Weight(lbs): 111 Blood Pressure(mmHg): 107/62 Body Mass Index(BMI): 19.1 Temperature(F): 97.8 Respiratory Rate(breaths/min): 20 [1:Photos:] [3:No Photos [id number] Page 3 of 9] Right, Dorsal Foot Right, Dorsal Foot Right, Medial Lower Leg Wound Location: Blister Blister Laceration Wounding Event: Lymphedema Lymphedema Trauma, Other Primary Etiology: Asthma, Chronic Obstructive Asthma, Chronic Obstructive Asthma, Chronic Obstructive Comorbid History: Pulmonary Disease (COPD), Pulmonary Disease (COPD), Pulmonary Disease (COPD), Congestive Heart Failure, Congestive Heart Failure, Congestive Heart Failure, Hypertension, Rheumatoid Arthritis, Hypertension, Rheumatoid Arthritis, Hypertension, Rheumatoid Arthritis, Neuropathy, Received Chemotherapy Neuropathy, Received Chemotherapy Neuropathy,  Received Chemotherapy 08/23/2022 08/23/2022 12/18/2022 Date Acquired: 19 19 5 $ Weeks of Treatment: Open Open Open Wound Status: No No No Wound Recurrence: 0.4x0.4x0.1 0.4x0.4x0.1 1.5x0.4x0.1 Measurements L x W x D (cm) 0.126 0.126 0.471 A (cm) : rea 0.013 0.013 0.047 Volume (cm) : 87.80% 87.80% 95.60% % Reduction in A rea: 97.90% 97.90% 95.60% % Reduction in Volume: Full Thickness Without Exposed Full Thickness Without Exposed Full Thickness Without Exposed Classification: Support Structures Support Structures Support Structures Medium Medium Medium Exudate A mount: Serosanguineous Serosanguineous Serosanguineous Exudate Type: red, brown red, brown red, brown Exudate Color: Distinct, outline attached Distinct, outline attached Distinct, outline attached Wound Margin: Large (67-100%) Large (67-100%) Large (67-100%) Granulation A mount: Pink Pink Red Granulation Quality: Small (1-33%) Small (1-33%) Small (1-33%) Necrotic A mount: Fat Layer (Subcutaneous Tissue): Yes Fat Layer (Subcutaneous Tissue): Yes Fat  Layer (Subcutaneous Tissue): Yes Exposed Structures: Fascia: No Fascia: No Fascia: No Tendon: No Tendon: No Tendon: No Muscle: No Muscle: No Muscle: No Joint: No Joint: No Joint: No Bone: No Bone: No Bone: No Small (1-33%) Small (1-33%) Medium (34-66%) Epithelialization: Debridement - Selective/Open Wound Debridement - Selective/Open Wound Debridement - Selective/Open Wound Debridement: Pre-procedure Verification/Time Out 10:55 10:55 10:55 Taken: Lidocaine 5% topical ointment Lidocaine 5% topical ointment Lidocaine 5% topical ointment Pain Control: 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 manager, [employer] Tissue Debrided: Non-Viable Tissue Non-Viable Tissue Non-Viable Tissue Level: 0.16 0.16 0.6 Debridement A (sq cm): rea Curette Curette Curette Instrument: Minimum Minimum Minimum Bleeding: Pressure Pressure Pressure Hemostasis A chieved: 0 0 0 Procedural Pain: 0 0 0 Post Procedural Pain: Procedure was tolerated well Procedure was tolerated well Procedure was tolerated well Debridement Treatment Response: 0.4x0.4x0.1 0.4x0.4x0.1 1.5x0.4x0.1 Post Debridement Measurements L x W x D (cm) 0.013 0.013 0.047 Post Debridement Volume: (cm) No Abnormalities Noted No Abnormalities Noted No Abnormalities Noted Periwound Skin Texture: Dry/Scaly: Yes Dry/Scaly: Yes No Abnormalities Noted Periwound Skin Moisture: Erythema: No Erythema: No Hemosiderin Staining: Yes Periwound Skin Color: No Abnormality No Abnormality No Abnormality Temperature: Debridement Debridement Debridement Procedures Performed: Treatment Notes Electronic Signature(s) Signed: 02/02/2023 11:11:57 AM By: Fredirick Maudlin MD FACS Entered By: Fredirick Maudlin on 02/02/2023 11:11:57 -------------------------------------------------------------------------------- Multi-Disciplinary Care Plan Details Patient Name: Date of Service: Sherry Masson RO N [name]. 02/02/2023 10:00 A M Medical Record Number: [id number] Patient  Account Number: [id number] Date of Birth/Sex: Treating RN: [birth year]-[name] (77 y.o. [name] Primary Care Richardson Dubree: Marcy Panning Other Clinician: Referring Tesa Meadors: Treating Ewin Rehberg/Extender: [name], ELA INE Weeks in Treatment: [address] [place] [name] ([id number]) [id number] Page 4 of 9 Peripheral Neuropathy Nursing Diagnoses: Potential alteration in peripheral tissue perfusion (select prior to confirmation of diagnosis) Goals: Patient/caregiver will verbalize understanding of disease process and disease management Date Initiated: 09/20/2022 Target Resolution Date: 03/25/2023 Goal Status: Active Interventions: Provide education on Management of Neuropathy and Related Ulcers Treatment Activities: Patient referred for customized footwear/offloading : 09/20/2022 Notes: Wound/Skin Impairment Nursing Diagnoses: Knowledge deficit related to ulceration/compromised skin integrity Goals: Ulcer/skin breakdown will have a volume reduction of 30% by week 4 Date Initiated: 09/20/2022 Date Inactivated: 11/29/2022 Target Resolution Date: 11/30/2022 Goal Status: Met Ulcer/skin breakdown will have a volume reduction of 50% by week 8 Date Initiated: 11/29/2022 Target Resolution Date: 03/25/2023 Goal Status: Active Interventions: Assess ulceration(s) every visit Treatment Activities: Skin care regimen initiated : 09/20/2022 Topical wound management initiated : 09/20/2022 Notes: Electronic Signature(s) Signed: 02/02/2023 5:51:49 PM By: Sherry Catholic RN Entered By: [name] on 02/02/2023 17:34:37 -------------------------------------------------------------------------------- Pain Assessment Details Patient Name: Date of Service: [name],  Sherry RO N [name]. 02/02/2023 10:00 A M Medical Record Number: [id number] Patient Account Number: [id number] Date of Birth/Sex: Treating RN: [birth year] (77 y.o. F) Primary Care Sherril Shipman: Marcy Panning Other Clinician: Referring Haseeb Fiallos: Treating Doshie Maggi/Extender: [name], ELA INE Weeks in Treatment: 19 Active Problems Location of Pain Severity and Description of Pain Patient Has Paino No Site Locations [place], [place] ([id number]) [id number] Page 5 of 9 Pain Management and Medication Current Pain Management: Electronic Signature(s) Signed: 02/02/2023 11:27:31 AM By: [name] Entered By: [name] on 02/02/2023 10:37:38 -------------------------------------------------------------------------------- Patient/Caregiver Education Details Patient Name: Date of Service: [name] 2/9/2024andnbsp10:00 A M Medical Record Number: [id number] Patient Account Number: [id number] Date of Birth/Gender: Treating RN: [birth year]-[name] (77 y.o. [name] Primary Care Physician: [name] INE Other Clinician: Referring Physician: Treating Physician/Extender: [name], ELA INE Weeks in Treatment: 50 Education Assessment Education Provided To: Patient Education Topics Provided Wound/Skin Impairment: Methods: Explain/Verbal Responses: Return demonstration correctly Electronic Signature(s) Signed: 02/02/2023 5:51:49 PM By: Sherry Catholic RN Entered By: [name] on 02/02/2023 17:34:50 -------------------------------------------------------------------------------- Wound Assessment Details Patient Name: Date of Service: Sherry Masson RO N [name]. 02/02/2023 10:00 A M Medical Record Number: [id number] Patient Account Number: [id number] Date of Birth/Sex: Treating RN: [birth year]/[name] (77 y.o. Marta Lamas Primary Care Zain Bingman: Marcy Panning Other Clinician: Referring Dwayn Moravek: Treating Jashaun Penrose/Extender: [name], ELA INE [place], [place] ([id number]) [id number] Page 6 of 9 Weeks in Treatment: 19 Wound Status Wound Number: 1 Primary  Lymphedema Etiology: Wound Location: Right, Dorsal Foot Wound Open Wounding Event: Blister Status: Date Acquired: 08/23/2022 Comorbid Asthma, Chronic Obstructive Pulmonary Disease (COPD), Weeks Of Treatment: 19 History: Congestive Heart Failure, Hypertension, Rheumatoid Arthritis, Clustered Wound: No Neuropathy, Received Chemotherapy Photos Wound Measurements Length: (cm) 0.4 Width: (cm) 0.4 Depth: (cm) 0.1 Area: (cm) 0.126 Volume: (cm) 0.013 % Reduction in Area: 87.8% % Reduction in Volume: 97.9% Epithelialization: Small (1-33%) Tunneling: No Undermining: No Wound Description Classification: Full Thickness Without Exposed Suppor Wound Margin: Distinct, outline attached Exudate Amount: Medium Exudate Type: Serosanguineous Exudate Color: red, brown t Structures Foul Odor After Cleansing: No Slough/Fibrino Yes Wound Bed Granulation Amount: Large (67-100%) Exposed Structure Granulation Quality: Pink Fascia Exposed: No Necrotic Amount: Small (1-33%) Fat Layer (Subcutaneous Tissue) Exposed: Yes Necrotic Quality: Adherent Slough Tendon Exposed: No Muscle Exposed: No Joint Exposed: No Bone Exposed: No Periwound Skin Texture Texture Color No Abnormalities Noted: Yes No Abnormalities Noted: No Erythema: No Moisture No Abnormalities Noted: No Temperature / Pain Dry / Scaly: Yes Temperature: No Abnormality Electronic Signature(s) Signed: 02/02/2023 4:08:01 PM By: Sherry East RN Entered By: [name] on 02/02/2023 10:49:28 -------------------------------------------------------------------------------- Wound Assessment Details Patient Name: Date of Service: Sherry Masson RO N [name]. 02/02/2023 10:00 A M Medical Record Number: [id number] Patient Account Number: [id number] Date of Birth/Sex: Treating RN: [name][birth year] (77 y.o. Marta Lamas Primary Care Zaxton Angerer: Marcy Panning Other Clinician: MERLENE, [name] ([id number]) [id number] Page 7  of 9 Referring Oak Dorey: Treating Kaenan Jake/Extender: [name], ELA INE Weeks in Treatment: 19 Wound Status Wound Number: 1 Primary Lymphedema Etiology: Wound Location: Right, Dorsal Foot Wound Open Wounding Event: Blister Status: Date Acquired: 08/23/2022 Comorbid Asthma, Chronic Obstructive Pulmonary Disease (COPD), Weeks Of Treatment: 19 History: Congestive Heart Failure, Hypertension, Rheumatoid Arthritis, Clustered Wound: No Neuropathy, Received Chemotherapy Photos Wound Measurements Length: (cm) 0.4 Width: (cm) 0.4 Depth: (cm) 0.1 Area: (cm) 0.126 Volume: (cm) 0.013 % Reduction in Area: 87.8% % Reduction in Volume: 97.9% Epithelialization: Small (1-33%) Tunneling:  No Undermining: No Wound Description Classification: Full Thickness Without Exposed Support Structures Wound Margin: Distinct, outline attached Exudate Amount: Medium Exudate Type: Serosanguineous Exudate Color: red, brown Foul Odor After Cleansing: No Slough/Fibrino Yes Wound Bed Granulation Amount: Large (67-100%) Exposed Structure Granulation Quality: Pink Fascia Exposed: No Necrotic Amount: Small (1-33%) Fat Layer (Subcutaneous Tissue) Exposed: Yes Necrotic Quality: Adherent Slough Tendon Exposed: No Muscle Exposed: No Joint Exposed: No Bone Exposed: No Periwound Skin Texture Texture Color No Abnormalities Noted: Yes No Abnormalities Noted: No Erythema: No Moisture No Abnormalities Noted: No Temperature / Pain Dry / Scaly: Yes Temperature: No Abnormality Treatment Notes Wound #1 (Foot) Wound Laterality: Dorsal, Right Cleanser Soap and Water Discharge Instruction: May shower and wash wound with dial antibacterial soap and water prior to dressing change. Wound Cleanser Discharge Instruction: Cleanse the wound with wound cleanser prior to applying a clean dressing using gauze sponges, not tissue or cotton balls. Peri-Wound Care Topical Primary Dressing Promogran  Prisma Matrix, 4.34 (sq in) (silver collagen) Discharge Instruction: Moisten collagen with saline or hydrogel [name], [name] ([id number]) [id number] Page 8 of 9 Secondary Dressing Woven Gauze Sponge, Non-Sterile 4x4 in Discharge Instruction: Apply over primary dressing as directed. Zetuvit Plus Silicone Border Dressing 4x4 (in/in) Discharge Instruction: Apply silicone border over primary dressing as directed. Secured With Compression Wrap Compression Stockings Environmental education officer) Signed: 02/02/2023 4:08:01 PM By: Sherry East RN Entered By: [name] on 02/02/2023 10:50:03 -------------------------------------------------------------------------------- Wound Assessment Details Patient Name: Date of Service: Sherry Masson RO N [name]. 02/02/2023 10:00 A M Medical Record Number: [id number] Patient Account Number: [id number] Date of Birth/Sex: Treating RN: [name], [name] (77 y.o. F) [name], [name] Primary Care Zahava Quant: [name] INE Other Clinician: Referring Eulah Walkup: Treating Tniyah Nakagawa/Extender: [name], ELA INE Weeks in Treatment: 19 Wound Status Wound Number: 3 Primary Trauma, Other Etiology: Wound Location: Right, Medial Lower Leg Wound Open Wounding Event: Laceration Status: Date Acquired: 12/18/2022 Comorbid Asthma, Chronic Obstructive Pulmonary Disease (COPD), Weeks Of Treatment: 5 History: Congestive Heart Failure, Hypertension, Rheumatoid Arthritis, Clustered Wound: No Neuropathy, Received Chemotherapy Wound Measurements Length: (cm) 1.5 Width: (cm) 0.4 Depth: (cm) 0.1 Area: (cm) 0.471 Volume: (cm) 0.047 % Reduction in Area: 95.6% % Reduction in Volume: 95.6% Epithelialization: Medium (34-66%) Tunneling: No Undermining: No Wound Description Classification: Full Thickness Without Exposed Support Structures Wound Margin: Distinct, outline attached Exudate Amount: Medium Exudate Type:  Serosanguineous Exudate Color: red, brown Foul Odor After Cleansing: No Slough/Fibrino Yes Wound Bed Granulation Amount: Large (67-100%) Exposed Structure Granulation Quality: Red Fascia Exposed: No Necrotic Amount: Small (1-33%) Fat Layer (Subcutaneous Tissue) Exposed: Yes Necrotic Quality: Adherent Slough Tendon Exposed: No Muscle Exposed: No Joint Exposed: No Bone Exposed: No Periwound Skin Texture Texture Color No Abnormalities Noted: Yes No Abnormalities Noted: No Hemosiderin Staining: Yes Moisture No Abnormalities Noted: Yes Temperature / Pain Temperature: No Abnormality [name], [name] ([id number] Page 9 of 9 Treatment Notes Wound #3 (Lower Leg) Wound Laterality: Right, Medial Cleanser Soap and Water Discharge Instruction: May shower and wash wound with dial antibacterial soap and water prior to dressing change. Wound Cleanser Discharge Instruction: Cleanse the wound with wound cleanser prior to applying a clean dressing using gauze sponges, not tissue or cotton balls. Peri-Wound Care Topical Primary Dressing Sorbalgon AG Dressing 2x2 (in/in) Discharge Instruction: Apply to wound bed as instructed Secondary Dressing ABD Pad, 8x10 Discharge Instruction: Apply over primary dressing as directed. Secured With Elastic Bandage 4 inch (ACE bandage) Discharge Instruction: Secure with ACE bandage as directed. Kerlix Roll Sterile, 4.5x3.1 (  in/yd) Discharge Instruction: Secure with Kerlix as directed. Compression Wrap Compression Stockings Add-Ons Electronic Signature(s) Signed: 02/02/2023 4:08:01 PM By: Sherry East RN Entered By: [name] on 02/02/2023 10:48:53 -------------------------------------------------------------------------------- Vitals Details Patient Name: Date of Service: Sherry Masson RO N [name]. 02/02/2023 10:00 A M Medical Record Number: [id number] Patient Account Number: [id number] Date of Birth/Sex: Treating  RN: [birth year] (77 y.o. F) Primary Care Gerilyn Stargell: [name] INE Other Clinician: Referring Breccan Galant: Treating Jonique Kulig/Extender: [name], ELA INE Weeks in Treatment: 19 Vital Signs Time Taken: 10:37 Temperature (F): 97.8 Height (in): 64 Pulse (bpm): 66 Weight (lbs): 111 Respiratory Rate (breaths/min): 20 Body Mass Index (BMI): 19.1 Blood Pressure (mmHg): 107/62 Reference Range: 80 - 120 mg / dl Electronic Signature(s) Signed: 02/02/2023 11:27:31 AM By: [name] Entered By: [name] on 02/02/2023 10:37:30

## 2023-02-03 NOTE — Progress Notes (Signed)
[name], [name] ([id number]) [id number] Page 1 of 11 Visit Report for 02/02/2023 Chief Complaint Document Details Patient Name: Date of Service: [name], [name] 02/02/2023 10:00 A M Medical Record Number: [id number] Patient Account Number: [id number] Date of Birth/Sex: Treating RN: March 26, 1946 (77 y.o. F) Primary Care Provider: Marcy Panning Other Clinician: Referring Provider: Treating Provider/Extender: Lacie Draft, ELA INE Weeks in Treatment: 46 Information Obtained from: Patient Chief Complaint Patient seen for complaints of Non-Healing Wounds. Electronic Signature(s) Signed: 02/02/2023 11:12:04 AM By: Fredirick Maudlin MD FACS Entered By: Fredirick Maudlin on 02/02/2023 11:12:04 -------------------------------------------------------------------------------- Debridement Details Patient Name: Date of Service: [name] [name] 02/02/2023 10:00 A M Medical Record Number: [id number] Patient Account Number: [id number] Date of Birth/Sex: Treating RN: [birth year] (77 y.o. [name] Primary Care Provider: Judie Bonus INE Other Clinician: Referring Provider: Treating Provider/Extender: Lacie Draft, ELA INE Weeks in Treatment: 19 Debridement Performed for Assessment: Wound #1 Right,Dorsal Foot Performed By: Physician Fredirick Maudlin, MD Debridement Type: Debridement Level of Consciousness (Pre-procedure): Awake and Alert Pre-procedure Verification/Time Out Yes - 10:55 Taken: Start Time: 10:55 Pain Control: Lidocaine 5% topical ointment T Area Debrided (L x W): otal 0.4 (cm) x 0.4 (cm) = 0.16 (cm) Tissue and other material debrided: Non-Viable, Slough, Slough Level: Non-Viable Tissue Debridement Description: Selective/Open Wound Instrument: Curette Bleeding: Minimum Hemostasis Achieved: Pressure End Time: 10:57 Procedural Pain: 0 Post Procedural Pain: 0 Response to Treatment: Procedure was tolerated  well Level of Consciousness (Post- Awake and Alert procedure): Post Debridement Measurements of Total Wound Length: (cm) 0.4 Width: (cm) 0.4 Depth: (cm) 0.1 Volume: (cm) 0.013 Character of Wound/Ulcer Post Debridement: Improved Post Procedure Diagnosis Ajanae, Fuente Christella H ([id number] Page 2 of 11 Same as Pre-procedure Notes Scribed for Dr. [name] by J.Scotton Electronic Signature(s) Signed: 02/02/2023 12:35:47 PM By: Fredirick Maudlin MD FACS Signed: 02/02/2023 5:51:49 PM By: Dellie Catholic RN Entered By: Dellie Catholic on 02/02/2023 11:01:51 -------------------------------------------------------------------------------- Debridement Details Patient Name: Date of Service: [name] [name] 02/02/2023 10:00 A M Medical Record Number: [id number] Patient Account Number: [id number] Date of Birth/Sex: Treating RN: 1946-12-21 (77 y.o. [name] Primary Care Provider: Judie Bonus INE Other Clinician: Referring Provider: Treating Provider/Extender: Lacie Draft, ELA INE Weeks in Treatment: 19 Debridement Performed for Assessment: Wound #3 Right,Medial Lower Leg Performed By: Physician Fredirick Maudlin, MD Debridement Type: Debridement Level of Consciousness (Pre-procedure): Awake and Alert Pre-procedure Verification/Time Out Yes - 10:55 Taken: Start Time: 10:55 Pain Control: Lidocaine 5% topical ointment T Area Debrided (L x W): otal 1.5 (cm) x 0.4 (cm) = 0.6 (cm) Tissue and other material debrided: Non-Viable, Eschar, Slough, Slough Level: Non-Viable Tissue Debridement Description: Selective/Open Wound Instrument: Curette Bleeding: Minimum Hemostasis Achieved: Pressure End Time: 10:57 Procedural Pain: 0 Post Procedural Pain: 0 Response to Treatment: Procedure was tolerated well Level of Consciousness (Post- Awake and Alert procedure): Post Debridement Measurements of Total Wound Length: (cm) 1.5 Width: (cm)  0.4 Depth: (cm) 0.1 Volume: (cm) 0.047 Character of Wound/Ulcer Post Debridement: Improved Post Procedure Diagnosis Same as Pre-procedure Notes Scribed for Dr. [name] by J.Scotton Electronic Signature(s) Signed: 02/02/2023 12:35:47 PM By: Fredirick Maudlin MD FACS Signed: 02/02/2023 5:51:49 PM By: Dellie Catholic RN Entered By: Dellie Catholic on 02/02/2023 11:02:44 Demirjian, [name] ([id number] Page 3 of 11 -------------------------------------------------------------------------------- HPI Details Patient Name: Date of Service: Sherry, [name] 02/02/2023 10:00 A M Medical Record Number: [id number] Patient Account Number: [id number] Date of Birth/Sex: Treating RN: 1946-08-11 (77 y.o. F)  Primary Care Provider: Marcy Panning Other Clinician: Referring Provider: Treating Provider/Extender: Lacie Draft, ELA INE Weeks in Treatment: 73 History of Present Illness HPI Description: ADMISSION 09/20/2022 This is a 77 year old woman with a past medical history significant for congestive heart failure, atrial fibrillation, rheumatoid arthritis on long-term steroid treatment, CMML currently receiving chemotherapy and moderate malnutrition. She presents to clinic today with a wound on the dorsum of her right foot. She says it started out as a bruise and then developed a bump which subsequently broke open. She says she was sent to our clinic by her oncologist. She is not diabetic and does not smoke. ABI in clinic today was 1.55. She has had formal segmental arterial Dopplers done, a little over a year ago, which did not show any evidence of occlusive vascular disease. On the dorsal aspect of the right foot, there is a wound with a lot of old hematoma, slough, eschar, and and nonviable subcutaneous tissue. T the best of my o ability to discern, it does appear to involve the muscle layer, but there is no evidence of necrosis. The wound is pouring  serous fluid; the patient has 3+ pitting edema to the bilateral lower extremities. 09/29/2022: The patient came to clinic today with nothing but a T overlying her wound. She is accompanied by her husband today. She has not elfa communicated with her cardiologist regarding her fluid overload. She still has 3+ pitting edema bilaterally. The wound is still pouring serous fluid, although not quite as effusively as at her initial visit. There is still quite a bit of nonviable tissue and hematoma present. 10/13; patient's wound on the right lateral lower leg his eschared over and may well be on its way to healing. The real problem here is on the right dorsal foot just proximal to her toes. The patient is still puzzled by the cause of this. Looking at a picture on the patient's smart phone there is a suggestion that this might have been a hematoma which seems to fit what Dr. [name] had felt on her initial evaluation. They have been using iodoform packing kerlix and Coban. 10/20; right lower leg is healed. We are approved for snap VAC on the right foot. We will try to arrange home health but until then she is going to need a nurse visit on Tuesday 10/20/2022: It has been 2 weeks since I have seen the wound. It has improved significantly since then. She is currently in a snap VAC and I think this has made quite a difference. The overall wound measurements are smaller. There is no massive drainage, as there had been previously. There is some good granulation tissue forming underneath a bit of slough. 10/30/2022: The wound continues to improve with use of the snap VAC. The swelling in her foot is better. The undermining continues to close in. There is a layer of slough overlying good granulation tissue. 11/06/2022: The wound has contracted further. The undermining is still present but the wound surface is better and her edema control is good. 11/13/2022: The wound is smaller again and the undermining has  decreased as well. There is slough accumulation over a nice bed of granulation tissue. 11/20/2022: We did not have any snap VAC devices in clinic last week so the wound was just packed. It did well and is smaller with less undermining today. There is still slough present on the surface. 11/29/2022: The wound has contracted considerably and the undermining has resolved. There is still some slough on  the wound surface. 12/13; slough on the surface of the wound required debridement however afterwards the granulation looks healthy the wound has been contracting. She has been using a snap VAC with collagen 12/13/2022: The wound was too small and superficial last week and the snap VAC was discontinued. They have been using Hydrofera Blue due to the hypertrophic granulation tissue. The wound has a little bit of slough on the surface. 12/28/2022: The wound on her dorsal foot continues to contract and is quite superficial at this point. There is a little bit of slough on the surface. Unfortunately, she struck her right medial leg on a hard surface on Christmas, resulting in a laceration. It was sutured in the emergency room. She just came from having the sutures removed. Overall, I do not think the skin is likely to survive, but for now there is no grossly open ulcer at this site. 01/03/2023: Her edema is fairly significant today and not at all well-controlled. The dorsal foot wound appears deeper, but this may be an illusion secondary to the swelling. There is a fair amount of slough accumulation in the wound. The medial leg wound, as expected, shows signs of skin necrosis and partial dehiscence. There is hematoma and necrotic fat visible. 01/10/2023: She removed her wrap about 4 days ago; despite this, both wounds are smaller and cleaner. There is still slough accumulation in both sites. 01/18/2023: She came in with her wrap on today but once it was removed, her leg began to visibly swell; it was quite an  interesting phenomenon to observe. I suspect that she has poor oncotic pressure due to her low albumin. As result, her edema control is suboptimal. Both wounds are smaller with just a little bit of slough present. 01/26/2023: Both wounds are considerably smaller today. Light slough on both surfaces. Small amount of hypertrophic granulation tissue on the dorsal foot wound. 02/02/2023: Both wounds continue to contract. There is some slough and eschar on the medial leg and a little bit of slough on the dorsal foot. Edema control is much better this week. Electronic Signature(s) Signed: 02/02/2023 11:12:34 AM By: Fredirick Maudlin MD FACS Entered By: Fredirick Maudlin on 02/02/2023 11:12:34 Allemand, [name] ([id number] Page 4 of 11 -------------------------------------------------------------------------------- Physical Exam Details Patient Name: Date of Service: SAMRIDHI, ADAMEK 02/02/2023 10:00 A M Medical Record Number: [id number] Patient Account Number: [id number] Date of Birth/Sex: Treating RN: Sep 11, 1946 (77 y.o. F) Primary Care Provider: Marcy Panning Other Clinician: Referring Provider: Treating Provider/Extender: Lacie Draft, ELA INE Weeks in Treatment: 19 Constitutional . . . . no acute distress. Respiratory Normal work of breathing on room air. Notes 02/02/2023: Both wounds continue to contract. There is some slough and eschar on the medial leg and a little bit of slough on the dorsal foot. Edema control is much better this week. Electronic Signature(s) Signed: 02/02/2023 11:12:59 AM By: Fredirick Maudlin MD FACS Entered By: Fredirick Maudlin on 02/02/2023 11:12:58 -------------------------------------------------------------------------------- Physician Orders Details Patient Name: Date of Service: [name] [name] 02/02/2023 10:00 A M Medical Record Number: [id number] Patient Account Number: [id number] Date of Birth/Sex:  Treating RN: [birth year] (77 y.o. 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 - Dr. [name] Room 1 Return A Anesthetic (In clinic) Topical Lidocaine 4% applied to wound bed Bathing/ Shower/ Hygiene Other Bathing/Shower/Hygiene Orders/Instructions: - Please do not get LEG WRAPS on Right leg wet. Sponge bathe or use a cast protector on right leg if showering/bathing. Edema Control - Lymphedema / SCD / Other Left Lower Extremity Avoid standing for long periods of time. Patient to wear own compression stockings every day. SAMARIS, MOZQUEDA ([id number]) [id number] Page 5 of 11 Compression stocking or Garment 20-30 mm/Hg pressure to: - left leg Wound Treatment Wound #1 - Foot Wound Laterality: Dorsal, Right Cleanser: Soap and Water Every Other Day/15 Days Discharge Instructions: May shower and wash wound with dial antibacterial soap and water prior to dressing change. Cleanser: Wound Cleanser (Generic) Every Other Day/15 Days Discharge Instructions: Cleanse the wound with wound cleanser prior to applying a clean dressing using gauze sponges, not tissue or cotton balls. Prim Dressing: Promogran Prisma Matrix, 4.34 (sq in) (silver collagen) Every Other Day/15  Days ary Discharge Instructions: Moisten collagen with saline or hydrogel Secondary Dressing: Woven Gauze Sponge, Non-Sterile 4x4 in (Generic) Every Other Day/15 Days Discharge Instructions: Apply over primary dressing as directed. Secondary Dressing: Zetuvit Plus Silicone Border Dressing 4x4 (in/in) (Generic) Every Other Day/15 Days Discharge Instructions: Apply silicone border over primary dressing as directed. Wound #3 - Lower Leg Wound Laterality: Right, Medial Cleanser: Soap and Water 1 x Per Week/30 Days Discharge Instructions: May shower and wash wound with dial antibacterial soap and water prior to dressing change. Cleanser: Wound Cleanser 1 x Per Week/30 Days Discharge Instructions: Cleanse the wound with wound cleanser prior to applying a clean dressing using gauze sponges, not tissue or cotton balls. Prim Dressing: Sorbalgon AG Dressing 2x2 (in/in) 1 x Per Week/30 Days ary Discharge Instructions: Apply to wound bed as instructed Secondary Dressing: ABD Pad, 8x10 1 x Per Week/30 Days Discharge Instructions: Apply over primary dressing as directed. Secured With: Elastic Bandage 4 inch (ACE bandage) 1 x Per Week/30 Days Discharge Instructions: Secure with ACE bandage as directed. Secured With: [employer], 4.5x3.1 (in/yd) 1 x Per Week/30 Days Discharge Instructions: Secure with Kerlix as directed. Electronic Signature(s) Signed: 02/02/2023 12:35:47 PM By: Fredirick Maudlin MD FACS Entered By: Fredirick Maudlin on 02/02/2023 11:13:14 -------------------------------------------------------------------------------- Problem List Details Patient Name: Date of Service: [name] [name] 02/02/2023 10:00 A M Medical Record Number: [id number] Patient Account Number: [id number] Date of Birth/Sex: Treating RN: 1946/11/27 (77 y.o. 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 layer 12/28/2022 No Yes exposed [place] ([id number]) [id number] Page 6 of 11 [id number] Chronic systolic (congestive) heart failure 09/20/2022 No Yes M06.9 Rheumatoid arthritis, unspecified 09/20/2022 No Yes Z79.52 Long term (current) use of systemic steroids 09/20/2022 No Yes I70.90 Unspecified atherosclerosis 09/20/2022 No Yes 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 02/02/2023 10:00 A M Medical Record Number: [id number] Patient Account Number: [id number] Date of Birth/Sex: Treating RN: 06/11/1946 (77 y.o. F) Primary Care Provider: Marcy Panning Other Clinician: Referring Provider: Treating Provider/Extender: Lacie Draft, ELA INE Weeks in Treatment: 26 Subjective Chief Complaint Information obtained from Patient Patient seen for complaints of Non-Healing Wounds. History of Present Illness (HPI) ADMISSION 09/20/2022 This is a 77 year old woman with a past medical history significant for congestive heart failure, atrial fibrillation, rheumatoid arthritis on long-term steroid treatment, CMML currently receiving  chemotherapy and moderate malnutrition. She presents to clinic today with a wound on the dorsum of her right foot. She says it started out as a bruise and then developed a bump which subsequently broke open. She says she was sent to our clinic by her oncologist. She is not diabetic and does not smoke. ABI in clinic today was 1.55. She has had formal segmental arterial Dopplers done, a little over a year ago, which did not show any evidence of occlusive vascular disease. On the dorsal aspect of the right foot, there is a wound with a lot of old hematoma, slough, eschar, and and nonviable subcutaneous tissue. T the best of my o ability to discern, it does appear to involve the muscle layer, but there is no evidence of necrosis. The wound is pouring serous fluid; the patient has 3+ pitting edema to the bilateral lower extremities. 09/29/2022: The patient came to clinic today with nothing but a T overlying her wound. She is accompanied by her husband today. She has not elfa communicated with her cardiologist regarding her fluid overload. She still has 3+ pitting edema bilaterally. The wound is still pouring serous fluid, although not quite as effusively as at her initial visit. There is still quite a bit of nonviable tissue and hematoma present. 10/13; patient's wound on the right lateral lower leg his eschared over and may well be on its way to healing. The real problem here is on the right dorsal foot LIVIGNI, Buelah H ([id number]) [id number] Page 7 of 11 just proximal to her toes. The patient is still puzzled by the cause of this. Looking at a picture on the patient's smart phone there is a suggestion that this might have been a hematoma which seems to fit what Dr. [name] had felt on her initial evaluation. They have been using iodoform packing kerlix and Coban. 10/20; right lower leg is healed. We are approved for snap VAC on the right foot. We will try to arrange home  health but until then she is going to need a nurse visit on Tuesday 10/20/2022: It has been 2 weeks since I have seen the wound. It has improved significantly since then. She is currently in a snap VAC and I think this has made quite a difference. The overall wound measurements are smaller. There is no massive drainage, as there had been previously. There is some good granulation tissue forming underneath a bit of slough. 10/30/2022: The wound continues to improve with use of the snap VAC. The swelling in her foot is better. The undermining continues to close in. There is a layer of slough overlying good granulation tissue. 11/06/2022: The wound has contracted further. The undermining  is still present but the wound surface is better and her edema control is good. 11/13/2022: The wound is smaller again and the undermining has decreased as well. There is slough accumulation over a nice bed of granulation tissue. 11/20/2022: We did not have any snap VAC devices in clinic last week so the wound was just packed. It did well and is smaller with less undermining today. There is still slough present on the surface. 11/29/2022: The wound has contracted considerably and the undermining has resolved. There is still some slough on the wound surface. 12/13; slough on the surface of the wound required debridement however afterwards the granulation looks healthy the wound has been contracting. She has been using a snap VAC with collagen 12/13/2022: The wound was too small and superficial last week and the snap VAC was discontinued. They have been using Hydrofera Blue due to the hypertrophic granulation tissue. The wound has a little bit of slough on the surface. 12/28/2022: The wound on her dorsal foot continues to contract and is quite superficial at this point. There is a little bit of slough on the surface. Unfortunately, she struck her right medial leg on a hard surface on Christmas, resulting in a laceration. It  was sutured in the emergency room. She just came from having the sutures removed. Overall, I do not think the skin is likely to survive, but for now there is no grossly open ulcer at this site. 01/03/2023: Her edema is fairly significant today and not at all well-controlled. The dorsal foot wound appears deeper, but this may be an illusion secondary to the swelling. There is a fair amount of slough accumulation in the wound. The medial leg wound, as expected, shows signs of skin necrosis and partial dehiscence. There is hematoma and necrotic fat visible. 01/10/2023: She removed her wrap about 4 days ago; despite this, both wounds are smaller and cleaner. There is still slough accumulation in both sites. 01/18/2023: She came in with her wrap on today but once it was removed, her leg began to visibly swell; it was quite an interesting phenomenon to observe. I suspect that she has poor oncotic pressure due to her low albumin. As result, her edema control is suboptimal. Both wounds are smaller with just a little bit of slough present. 01/26/2023: Both wounds are considerably smaller today. Light slough on both surfaces. Small amount of hypertrophic granulation tissue on the dorsal foot wound. 02/02/2023: Both wounds continue to contract. There is some slough and eschar on the medial leg and a little bit of slough on the dorsal foot. Edema control is much better this week. Patient History Information obtained from Patient. Family History Unknown History, Diabetes - Father, Heart Disease - Father, No family history of Cancer. Social History Former smoker - quit 2003, Marital Status - Married, Alcohol Use - Daily, Drug Use - Prior History, Caffeine Use - Daily. Medical History Eyes Denies history of Cataracts, Glaucoma, Optic Neuritis Ear/Nose/Mouth/Throat Denies history of Chronic sinus problems/congestion, Middle ear problems Respiratory Patient has history of Asthma, Chronic Obstructive Pulmonary  Disease (COPD) Cardiovascular Patient has history of Congestive Heart Failure, Hypertension Denies history of Coronary Artery Disease, Deep Vein Thrombosis, Hypotension, Myocardial Infarction Endocrine Denies history of Type I Diabetes, Type II Diabetes Immunological Denies history of Lupus Erythematosus, Raynaudoos, Scleroderma Integumentary (Skin) Denies history of History of Burn Musculoskeletal Patient has history of Rheumatoid Arthritis Denies history of Gout Neurologic Patient has history of Neuropathy Oncologic Patient has history of Received Chemotherapy - currently  receiving Hospitalization/Surgery History - reverse shoulder arthroplasty Rt- 2018. Medical A Surgical History Notes nd Cardiovascular AFIB Genitourinary Stage 3 CKD Celyna, Ona Yeraldine H ([id number]) [id number] Page 8 of 11 Objective Constitutional no acute distress. Vitals Time Taken: 10:37 AM, Height: 64 in, Weight: 111 lbs, BMI: 19.1, Temperature: 97.8 F, Pulse: 66 bpm, Respiratory Rate: 20 breaths/min, Blood Pressure: 107/62 mmHg. Respiratory Normal work of breathing on room air. General Notes: 02/02/2023: Both wounds continue to contract. There is some slough and eschar on the medial leg and a little bit of slough on the dorsal foot. Edema control is much better this week. Integumentary (Hair, Skin) Wound #1 status is Open. Original cause of wound was Blister. The date acquired was: 08/23/2022. The wound has been in treatment 19 weeks. The wound is located on the Right,Dorsal Foot. The wound measures 0.4cm length x 0.4cm width x 0.1cm depth; 0.126cm^2 area and 0.013cm^3 volume. There is Fat Layer (Subcutaneous Tissue) exposed. There is no tunneling or undermining noted. There is a medium amount of serosanguineous drainage noted. The wound margin is distinct with the outline attached to the wound base. There is large (67-100%) pink granulation within the wound bed. There is a  small (1-33%) amount of necrotic tissue within the wound bed including Adherent Slough. The periwound skin appearance had no abnormalities noted for texture. The periwound skin appearance exhibited: Dry/Scaly. The periwound skin appearance did not exhibit: Erythema. Periwound temperature was noted as No Abnormality. Wound #1 status is Open. Original cause of wound was Blister. The date acquired was: 08/23/2022. The wound has been in treatment 19 weeks. The wound is located on the Right,Dorsal Foot. The wound measures 0.4cm length x 0.4cm width x 0.1cm depth; 0.126cm^2 area and 0.013cm^3 volume. There is Fat Layer (Subcutaneous Tissue) exposed. There is no tunneling or undermining noted. There is a medium amount of serosanguineous drainage noted. The wound margin is distinct with the outline attached to the wound base. There is large (67-100%) pink granulation within the wound bed. There is a small (1-33%) amount of necrotic tissue within the wound bed including Adherent Slough. The periwound skin appearance had no abnormalities noted for texture. The periwound skin appearance exhibited: Dry/Scaly. The periwound skin appearance did not exhibit: Erythema. Periwound temperature was noted as No Abnormality. Wound #3 status is Open. Original cause of wound was Laceration. The date acquired was: 12/18/2022. The wound has been in treatment 5 weeks. The wound is located on the Right,Medial Lower Leg. The wound measures 1.5cm length x 0.4cm width x 0.1cm depth; 0.471cm^2 area and 0.047cm^3 volume. There is Fat Layer (Subcutaneous Tissue) exposed. There is no tunneling or undermining noted. There is a medium amount of serosanguineous drainage noted. The wound margin is distinct with the outline attached to the wound base. There is large (67-100%) red granulation within the wound bed. There is a small (1-33%) amount of necrotic tissue within the wound bed including Adherent Slough. The periwound skin appearance had  no abnormalities noted for texture. The periwound skin appearance had no abnormalities noted for moisture. The periwound skin appearance exhibited: Hemosiderin Staining. Periwound temperature was noted as No Abnormality. 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 There was a Selective/Open Wound Non-Viable Tissue Debridement with a total area of 0.16 sq cm performed by Fredirick Maudlin, MD. With the following instrument(s): Curette to remove Non-Viable tissue/material. Material removed includes [facility] after achieving pain control using Lidocaine 5% topical ointment. No specimens were taken. A time out was conducted at 10:55, prior to the start of the procedure. A Minimum amount of bleeding was controlled with Pressure. The procedure was tolerated well with a pain level of 0 throughout and a pain level of 0 following the procedure. Post Debridement Measurements: 0.4cm length x 0.4cm width x 0.1cm depth; 0.013cm^3 volume. Character of Wound/Ulcer Post Debridement is improved. Post procedure Diagnosis Wound #1: Same as Pre-Procedure General Notes: Scribed for Dr. [name] by J.Scotton. Wound #3 Pre-procedure diagnosis of Wound #3 is a Trauma, Other located on the Right,Medial Lower Leg . There was a Selective/Open Wound Non-Viable Tissue Debridement with a total area of 0.6 sq cm performed by Fredirick Maudlin, MD. With the following instrument(s): Curette to remove Non-Viable tissue/material. Material  removed includes Eschar and Slough and after achieving pain control using Lidocaine 5% topical ointment. No specimens were taken. A time out was conducted at 10:55, prior to the start of the procedure. A Minimum amount of bleeding was controlled with Pressure. The procedure was tolerated well with a pain level of 0 throughout and a pain level of 0 following the procedure. Post Debridement Measurements: 1.5cm length x 0.4cm width x 0.1cm depth; 0.047cm^3 volume. ACCALIA, URUCHIMA ([id number]) [id number] Page 9 of 11 Character of Wound/Ulcer Post Debridement is improved. Post procedure Diagnosis Wound #3: Same as Pre-Procedure General Notes: Scribed for Dr. [name] by J.Scotton. Plan Follow-up Appointments: Return Appointment in 1 week. - Dr. [name] Room 1 Anesthetic: (In clinic) Topical Lidocaine 4% applied to wound bed Bathing/ Shower/ Hygiene: Other Bathing/Shower/Hygiene Orders/Instructions: - Please do not get LEG WRAPS on Right leg wet. Sponge bathe or use a cast protector on right leg if showering/bathing. Edema Control - Lymphedema / SCD / Other: Avoid standing for long periods of time. Patient to wear own compression stockings every day. Compression stocking or Garment 20-30 mm/Hg pressure to: - left leg WOUND #1: - Foot Wound Laterality: Dorsal, Right Cleanser: Soap and Water Every Other Day/15 Days Discharge Instructions: May shower and wash wound with dial antibacterial soap and water prior to dressing change. Cleanser: Wound Cleanser (Generic) Every Other Day/15 Days Discharge Instructions: Cleanse the wound with wound cleanser prior to applying a clean dressing using gauze sponges, not tissue or cotton balls. Prim Dressing: Promogran Prisma Matrix, 4.34 (sq in) (silver collagen) Every Other Day/15 Days ary Discharge Instructions: Moisten collagen with saline or hydrogel Secondary Dressing: Woven Gauze Sponge, Non-Sterile 4x4 in (Generic) Every Other  Day/15 Days Discharge Instructions: Apply over primary dressing as directed. Secondary Dressing: Zetuvit Plus Silicone Border Dressing 4x4 (in/in) (Generic) Every Other Day/15 Days Discharge Instructions: Apply silicone border over primary dressing as directed. WOUND #3: - Lower Leg Wound Laterality: Right, Medial Cleanser: Soap and Water 1 x Per Week/30 Days Discharge Instructions: May shower and wash wound with dial antibacterial soap and water prior to dressing change. Cleanser: Wound Cleanser 1 x Per Week/30 Days Discharge Instructions: Cleanse the wound with wound cleanser prior to applying a clean dressing using gauze sponges, not tissue or cotton balls. Prim Dressing: Sorbalgon AG Dressing 2x2 (in/in) 1 x Per Week/30 Days ary Discharge Instructions: Apply to wound bed as  instructed Secondary Dressing: ABD Pad, 8x10 1 x Per Week/30 Days Discharge Instructions: Apply over primary dressing as directed. Secured With: Elastic Bandage 4 inch (ACE bandage) 1 x Per Week/30 Days Discharge Instructions: Secure with ACE bandage as directed. Secured With: [employer], 4.5x3.1 (in/yd) 1 x Per Week/30 Days Discharge Instructions: Secure with Kerlix as directed. 02/02/2023: Both wounds continue to contract. There is some slough and eschar on the medial leg and a little bit of slough on the dorsal foot. Edema control is much better this week. I used a curette to debride slough and eschar from the medial leg wound and slough from the dorsal foot wound. We will continue Prisma on the foot and silver alginate on the medial leg. Continue Kerlix and Ace bandage wrapping. Follow-up in 1 week. Electronic Signature(s) Signed: 02/02/2023 11:13:56 AM By: Fredirick Maudlin MD FACS Entered By: Fredirick Maudlin on 02/02/2023 11:13:56 -------------------------------------------------------------------------------- HxROS Details Patient Name: Date of Service: [name] [name] 02/02/2023 10:00 A M Medical  Record Number: [id number] Patient Account Number: [id number] Date of Birth/Sex: Treating RN: 30-Sep-1946 (77 y.o. F) Primary Care Provider: Marcy Panning Other Clinician: Referring Provider: Treating Provider/Extender: Lacie Draft, ELA INE Weeks in Treatment: 49 Information Obtained From Patient Eyes Medical History: ELIJAH, HIRTZ ([id number]) [id number] Page 10 of 11 Negative for: Cataracts; Glaucoma; Optic Neuritis Ear/Nose/Mouth/Throat Medical History: Negative for: Chronic sinus problems/congestion; Middle ear problems Respiratory Medical History: Positive for: Asthma; Chronic Obstructive Pulmonary Disease (COPD) Cardiovascular Medical History: Positive for: Congestive Heart Failure; Hypertension Negative for: Coronary Artery Disease; Deep Vein Thrombosis; Hypotension; Myocardial Infarction Past Medical History Notes: AFIB Endocrine Medical History: Negative for: Type I Diabetes; Type II Diabetes Genitourinary Medical History: Past Medical History Notes: Stage 3 CKD Immunological Medical History: Negative for: Lupus Erythematosus; Raynauds; Scleroderma Integumentary (Skin) Medical History: Negative for: History of Burn Musculoskeletal Medical History: Positive for: Rheumatoid Arthritis Negative for: Gout Neurologic Medical History: Positive for: Neuropathy Oncologic Medical History: Positive for: Received Chemotherapy - currently receiving Immunizations Pneumococcal Vaccine: Received Pneumococcal Vaccination: Yes Received Pneumococcal Vaccination On or After 60th Birthday: Yes Implantable Devices None Hospitalization / Surgery History Type of Hospitalization/Surgery reverse shoulder arthroplasty Rt- 2018 Family and Social History Unknown History: Yes; Cancer: No; Diabetes: Yes - Father; Heart Disease: Yes - Father; Former smoker - quit 2003; Marital Status - Married; Alcohol Use: Daily; Drug Use: Prior  History; Caffeine Use: Daily; Financial Concerns: No; Food, Clothing or Shelter Needs: No; Support System Lacking: No; Transportation Concerns: No Engineer, maintenance) Signed: 02/02/2023 12:35:47 PM By: Fredirick Maudlin MD FACS [place], Signed: 02/02/2023 12:35:47 PM By: Fredirick Maudlin MD [place] ([id number]) [id number] Page 11 of 11 Entered By: Fredirick Maudlin on 02/02/2023 11:12:39 -------------------------------------------------------------------------------- SuperBill Details Patient Name: Date of Service: VERANIA, ROYTMAN 02/02/2023 Medical Record Number: [id number] Patient Account Number: [id number] Date of Birth/Sex: Treating RN: Feb 02, 1946 (77 y.o. 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 Facility Procedures : CPT4 Code: [id number] Description: [id number] - DEBRIDE WOUND 1ST 20 SQ CM  OR < ICD-10 Diagnosis Description 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 wit L97.812 Non-pressure chronic ulcer of other part of right lower leg with fat  layer exposed Modifier: hout evidence of n Quantity: 1 ecrosis Physician Procedures : CPT4 Code Description Modifier [id number] - WC PHYS LEVEL 3 - EST PT 25 ICD-10 Diagnosis Description L97.515 Non-pressure chronic ulcer of other part of right  foot with muscle involvement without evidence of ne L97.812 Non-pressure chronic ulcer of  other part of right lower leg with fat layer exposed [id number] Chronic systolic (congestive) heart failure Z79.52 Long term (current) use of systemic steroids Quantity: 1 crosis : [id number] - WC PHYS DEBR WO ANESTH 20 SQ CM ICD-10 Diagnosis Description 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

## 2023-02-05 ENCOUNTER — Other Ambulatory Visit: Payer: Self-pay

## 2023-02-05 DIAGNOSIS — C931 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: Secondary | ICD-10-CM

## 2023-02-06 ENCOUNTER — Inpatient Hospital Stay: Payer: Medicare Other

## 2023-02-06 DIAGNOSIS — Z95828 Presence of other vascular implants and grafts: Secondary | ICD-10-CM

## 2023-02-06 DIAGNOSIS — C931 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: Secondary | ICD-10-CM

## 2023-02-06 DIAGNOSIS — Z5111 Encounter for antineoplastic chemotherapy: Secondary | ICD-10-CM | POA: Diagnosis not present

## 2023-02-06 LAB — CMP (CANCER CENTER ONLY)
ALT: 25 U/L (ref 0–44)
AST: 25 U/L (ref 15–41)
Albumin: 3.9 g/dL (ref 3.5–5.0)
Alkaline Phosphatase: 57 U/L (ref 38–126)
Anion gap: 8 (ref 5–15)
BUN: 16 mg/dL (ref 8–23)
CO2: 28 mmol/L (ref 22–32)
Calcium: 9.1 mg/dL (ref 8.9–10.3)
Chloride: 102 mmol/L (ref 98–111)
Creatinine: 0.86 mg/dL (ref 0.44–1.00)
GFR, Estimated: >60 mL/min (ref >60)
Glucose, Bld: 113 mg/dL — ABNORMAL HIGH (ref 70–99)
Potassium: 4.6 mmol/L (ref 3.5–5.1)
Sodium: 138 mmol/L (ref 135–145)
Total Bilirubin: 0.7 mg/dL (ref 0.3–1.2)
Total Protein: 6.2 g/dL — ABNORMAL LOW (ref 6.5–8.1)

## 2023-02-06 LAB — CBC WITH DIFFERENTIAL (CANCER CENTER ONLY)
Abs Immature Granulocytes: 0.01 10*3/uL (ref 0.00–0.07)
Basophils Absolute: 0 10*3/uL (ref 0.0–0.1)
Basophils Relative: 0 %
Eosinophils Absolute: 0 10*3/uL (ref 0.0–0.5)
Eosinophils Relative: 1 %
HCT: 30.6 % — ABNORMAL LOW (ref 36.0–46.0)
Hemoglobin: 10.1 g/dL — ABNORMAL LOW (ref 12.0–15.0)
Immature Granulocytes: 0 %
Lymphocytes Relative: 27 %
Lymphs Abs: 1.3 10*3/uL (ref 0.7–4.0)
MCH: 35.1 pg — ABNORMAL HIGH (ref 26.0–34.0)
MCHC: 33 g/dL (ref 30.0–36.0)
MCV: 106.3 fL — ABNORMAL HIGH (ref 80.0–100.0)
Monocytes Absolute: 0.4 10*3/uL (ref 0.1–1.0)
Monocytes Relative: 9 %
Neutro Abs: 3 10*3/uL (ref 1.7–7.7)
Neutrophils Relative %: 63 %
Platelet Count: 67 10*3/uL — ABNORMAL LOW (ref 150–400)
RBC: 2.88 MIL/uL — ABNORMAL LOW (ref 3.87–5.11)
RDW: 15.6 % — ABNORMAL HIGH (ref 11.5–15.5)
WBC Count: 4.8 10*3/uL (ref 4.0–10.5)
nRBC: 0 % (ref 0.0–0.2)

## 2023-02-06 MED ORDER — HEPARIN SOD (PORK) LOCK FLUSH 100 UNIT/ML IV SOLN
500.0000 [IU] | Freq: Once | INTRAVENOUS | Status: AC
  Administered 2023-02-06: 500 [IU]

## 2023-02-06 MED ORDER — SODIUM CHLORIDE 0.9% FLUSH
10.0000 mL | Freq: Once | INTRAVENOUS | Status: AC
  Administered 2023-02-06: 10 mL

## 2023-02-09 ENCOUNTER — Encounter (HOSPITAL_BASED_OUTPATIENT_CLINIC_OR_DEPARTMENT_OTHER): Payer: Medicare Other | Admitting: General Surgery

## 2023-02-09 DIAGNOSIS — J4489 Other specified chronic obstructive pulmonary disease: Secondary | ICD-10-CM | POA: Diagnosis not present

## 2023-02-09 DIAGNOSIS — I890 Lymphedema, not elsewhere classified: Secondary | ICD-10-CM | POA: Diagnosis not present

## 2023-02-09 DIAGNOSIS — L97822 Non-pressure chronic ulcer of other part of left lower leg with fat layer exposed: Secondary | ICD-10-CM | POA: Diagnosis not present

## 2023-02-09 DIAGNOSIS — I130 Hypertensive heart and chronic kidney disease with heart failure and stage 1 through stage 4 chronic kidney disease, or unspecified chronic kidney disease: Secondary | ICD-10-CM | POA: Diagnosis not present

## 2023-02-09 DIAGNOSIS — S81801A Unspecified open wound, right lower leg, initial encounter: Secondary | ICD-10-CM | POA: Diagnosis not present

## 2023-02-09 DIAGNOSIS — L97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 evidence of necrosis: Secondary | ICD-10-CM | POA: Diagnosis not present

## 2023-02-09 DIAGNOSIS — L97812 Non-pressure chronic ulcer of other part of right lower leg with fat layer exposed: Secondary | ICD-10-CM | POA: Diagnosis not present

## 2023-02-10 NOTE — Progress Notes (Signed)
[name] ([id number]) [id number] Page 1 of 11 Visit Report for 02/09/2023 Chief Complaint Document Details Patient Name: Date of Service: [name], [name] 02/09/2023 8:30 A M Medical Record Number: [id number] Patient Account Number: [id number] Date of Birth/Sex: Treating RN: 05/09/1946 (77 y.o. F) Primary Care Provider: Marcy Panning Other Clinician: Referring Provider: Treating Provider/Extender: Lacie Draft, ELA INE Weeks in Treatment: 20 Information Obtained from: Patient Chief Complaint Patient seen for complaints of Non-Healing Wounds. Electronic Signature(s) Signed: 02/09/2023 8:54:15 AM By: Fredirick Maudlin MD FACS Entered By: Fredirick Maudlin on 02/09/2023 08:54:14 -------------------------------------------------------------------------------- Debridement Details Patient Name: Date of Service: [name]. 02/09/2023 8:30 A M Medical Record Number: [id number] Patient Account Number: [id number] Date of Birth/Sex: Treating RN: [birth year] (77 y.o. [name] Primary Care Provider: Judie Bonus INE Other Clinician: Referring Provider: Treating Provider/Extender: Lacie Draft, ELA INE Weeks in Treatment: 20 Debridement Performed for Assessment: Wound #1 Right,Dorsal Foot Performed By: Physician Fredirick Maudlin, MD Debridement Type: Debridement Level of Consciousness (Pre-procedure): Awake and Alert Pre-procedure Verification/Time Out Yes - 08:44 Taken: Start Time: 08:44 Pain Control: Lidocaine 4% T opical Solution T Area Debrided (L x W): otal 0.2 (cm) x 0.2 (cm) = 0.04 (cm) Tissue and other material debrided: Non-Viable, Eschar, Slough, Slough Level: Non-Viable Tissue Debridement Description: Selective/Open Wound Instrument: Curette Bleeding: Minimum Hemostasis Achieved: Pressure Response to Treatment: Procedure was tolerated well Level of Consciousness (Post- Awake and  Alert procedure): Post Debridement Measurements of Total Wound Length: (cm) 0.2 Width: (cm) 0.2 Depth: (cm) 0.1 Volume: (cm) 0.003 Character of Wound/Ulcer Post Debridement: Improved Post Procedure Diagnosis Same as Pre-procedure [name], [name] [name] ([id number]) [phone].pdf Page 2 of 11 Notes scribed for Dr. [name] by Adline Peals, RN Electronic Signature(s) Signed: 02/09/2023 11:29:12 AM By: Fredirick Maudlin MD FACS Signed: 02/09/2023 4:31:34 PM By: Sabas Sous By: Adline Peals on 02/09/2023 08:46:20 -------------------------------------------------------------------------------- Debridement Details Patient Name: Date of Service: [name]. 02/09/2023 8:30 A M Medical Record Number: [id number] Patient Account Number: [id number] Date of Birth/Sex: Treating RN: Jul 18, 1946 (77 y.o. [name] Primary Care Provider: Judie Bonus INE Other Clinician: Referring Provider: Treating Provider/Extender: Lacie Draft, ELA INE Weeks in Treatment: 20 Debridement Performed for Assessment: Wound #3 Right,Medial Lower Leg Performed By: Physician Fredirick Maudlin, MD Debridement Type: Debridement Level of Consciousness (Pre-procedure): Awake and Alert Pre-procedure Verification/Time Out Yes - 08:44 Taken: Start Time: 08:44 Pain Control: Lidocaine 4% T opical Solution T Area Debrided (L x W): otal 1 (cm) x 0.7 (cm) = 0.7 (cm) Tissue and other material debrided: Non-Viable, Eschar, Slough, Slough Level: Non-Viable Tissue Debridement Description: Selective/Open Wound Instrument: Curette Bleeding: Minimum Hemostasis Achieved: Pressure Response to Treatment: Procedure was tolerated well Level of Consciousness (Post- Awake and Alert procedure): Post Debridement Measurements of Total Wound Length: (cm) 1 Width: (cm) 0.7 Depth: (cm) 0.1 Volume: (cm) 0.055 Character of Wound/Ulcer Post Debridement:  Improved Post Procedure Diagnosis Same as Pre-procedure Notes scribed for Dr. [name] by Adline Peals, RN Electronic Signature(s) Signed: 02/09/2023 11:29:12 AM By: Fredirick Maudlin MD FACS Signed: 02/09/2023 4:31:34 PM By: Adline Peals Entered By: Adline Peals on 02/09/2023 08:47:15 -------------------------------------------------------------------------------- HPI Details Patient Name: Date of Service: [name]. 02/09/2023 8:30 A M Medical Record Number: [id number] Patient Account Number: [id number] Date of Birth/Sex: Treating RN: 28-Jan-1946 (77 y.o. F) Primary Care Provider: Marcy Panning Other Clinician: VERNEL, [name] ([id number]) [id number] Page 3 of 11 Referring Provider: Treating Provider/Extender: Lacie Draft,  ELA INE Weeks in Treatment: 20 History of Present Illness HPI Description: ADMISSION 09/20/2022 This is a 77 year old woman with a past medical history significant for congestive heart failure, atrial fibrillation, rheumatoid arthritis on long-term steroid treatment, CMML currently receiving chemotherapy and moderate malnutrition. She presents to clinic today with a wound on the dorsum of her right foot. She says it started out as a bruise and then developed a bump which subsequently broke open. She says she was sent to our clinic by her oncologist. She is not diabetic and does not smoke. ABI in clinic today was 1.55. She has had formal segmental arterial Dopplers done, a little over a year ago, which did not show any evidence of occlusive vascular disease. On the dorsal aspect of the right foot, there is a wound with a lot of old hematoma, slough, eschar, and and nonviable subcutaneous tissue. T the best of my o ability to discern, it does appear to involve the muscle layer, but there is no evidence of necrosis. The wound is pouring serous fluid; the patient has 3+ pitting edema to the bilateral  lower extremities. 09/29/2022: The patient came to clinic today with nothing but a T overlying her wound. She is accompanied by her husband today. She has not elfa communicated with her cardiologist regarding her fluid overload. She still has 3+ pitting edema bilaterally. The wound is still pouring serous fluid, although not quite as effusively as at her initial visit. There is still quite a bit of nonviable tissue and hematoma present. 10/13; patient's wound on the right lateral lower leg his eschared over and may well be on its way to healing. The real problem here is on the right dorsal foot just proximal to her toes. The patient is still puzzled by the cause of this. Looking at a picture on the patient's smart phone there is a suggestion that this might have been a hematoma which seems to fit what Dr. [name] had felt on her initial evaluation. They have been using iodoform packing kerlix and Coban. 10/20; right lower leg is healed. We are approved for snap VAC on the right foot. We will try to arrange home health but until then she is going to need a nurse visit on Tuesday 10/20/2022: It has been 2 weeks since I have seen the wound. It has improved significantly since then. She is currently in a snap VAC and I think this has made quite a difference. The overall wound measurements are smaller. There is no massive drainage, as there had been previously. There is some good granulation tissue forming underneath a bit of slough. 10/30/2022: The wound continues to improve with use of the snap VAC. The swelling in her foot is better. The undermining continues to close in. There is a layer of slough overlying good granulation tissue. 11/06/2022: The wound has contracted further. The undermining is still present but the wound surface is better and her edema control is good. 11/13/2022: The wound is smaller again and the undermining has decreased as well. There is slough accumulation over a nice bed of  granulation tissue. 11/20/2022: We did not have any snap VAC devices in clinic last week so the wound was just packed. It did well and is smaller with less undermining today. There is still slough present on the surface. 11/29/2022: The wound has contracted considerably and the undermining has resolved. There is still some slough on the wound surface. 12/13; slough on the surface of the wound required debridement however afterwards  the granulation looks healthy the wound has been contracting. She has been using a snap VAC with collagen 12/13/2022: The wound was too small and superficial last week and the snap VAC was discontinued. They have been using Hydrofera Blue due to the hypertrophic granulation tissue. The wound has a little bit of slough on the surface. 12/28/2022: The wound on her dorsal foot continues to contract and is quite superficial at this point. There is a little bit of slough on the surface. Unfortunately, she struck her right medial leg on a hard surface on Christmas, resulting in a laceration. It was sutured in the emergency room. She just came from having the sutures removed. Overall, I do not think the skin is likely to survive, but for now there is no grossly open ulcer at this site. 01/03/2023: Her edema is fairly significant today and not at all well-controlled. The dorsal foot wound appears deeper, but this may be an illusion secondary to the swelling. There is a fair amount of slough accumulation in the wound. The medial leg wound, as expected, shows signs of skin necrosis and partial dehiscence. There is hematoma and necrotic fat visible. 01/10/2023: She removed her wrap about 4 days ago; despite this, both wounds are smaller and cleaner. There is still slough accumulation in both sites. 01/18/2023: She came in with her wrap on today but once it was removed, her leg began to visibly swell; it was quite an interesting phenomenon to observe. I suspect that she has poor oncotic  pressure due to her low albumin. As result, her edema control is suboptimal. Both wounds are smaller with just a little bit of slough present. 01/26/2023: Both wounds are considerably smaller today. Light slough on both surfaces. Small amount of hypertrophic granulation tissue on the dorsal foot wound. 02/02/2023: Both wounds continue to contract. There is some slough and eschar on the medial leg and a little bit of slough on the dorsal foot. Edema control is much better this week. 02/09/2023: The dorsal foot wound is nearly closed. The medial leg wound is substantially smaller. There is slough on the dorsal foot and slough and eschar on the medial leg. Edema control remains good. Electronic Signature(s) Signed: 02/09/2023 8:54:45 AM By: Fredirick Maudlin MD FACS Entered By: Fredirick Maudlin on 02/09/2023 08:54:45 Remmert, [name] ([id number] Page 4 of 11 -------------------------------------------------------------------------------- Physical Exam Details Patient Name: Date of Service: [name], [name] 02/09/2023 8:30 A M Medical Record Number: [id number] Patient Account Number: [id number] Date of Birth/Sex: Treating RN: 09/11/1946 (77 y.o. F) Primary Care Provider: Marcy Panning Other Clinician: Referring Provider: Treating Provider/Extender: Lacie Draft, ELA INE Weeks in Treatment: 20 Constitutional . . . . no acute distress. Respiratory Normal work of breathing on room air. Notes 02/09/2023: The dorsal foot wound is nearly closed. The medial leg wound is substantially smaller. There is slough on the dorsal foot and slough and eschar on the medial leg. Edema control remains good. Electronic Signature(s) Signed: 02/09/2023 8:56:47 AM By: Fredirick Maudlin MD FACS Entered By: Fredirick Maudlin on 02/09/2023 08:56:47 -------------------------------------------------------------------------------- Physician Orders Details Patient  Name: Date of Service: [name]. 02/09/2023 8:30 A M Medical Record Number: [id number] Patient Account Number: [id number] Date of Birth/Sex: Treating RN: 06/28/1946 (77 y.o. 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 - Dr. [name] Room 2 Return A Anesthetic (In clinic) Topical Lidocaine 4% applied to wound bed Bathing/ Shower/ Hygiene Other Bathing/Shower/Hygiene Orders/Instructions: - Please do not get LEG WRAPS on Right leg wet. Sponge bathe or use a cast protector on right leg if showering/bathing. Edema Control - Lymphedema / SCD / Other Left Lower Extremity Avoid standing for long periods of time. Patient to wear own compression stockings every day. Compression stocking or Garment 20-30 mm/Hg pressure to: - left leg Wound Treatment Wound #1 - Foot Wound Laterality: Dorsal, Right Cleanser: Soap and Water Every Other Day/15 Days Discharge Instructions: May shower and wash wound with dial antibacterial soap and water prior to dressing change. SOREN, LANGWORTHY ([id number]) [id number] Page 5 of 11 Cleanser: Wound Cleanser (Generic) Every Other Day/15 Days Discharge Instructions: Cleanse the wound with wound cleanser prior to applying a  clean dressing using gauze sponges, not tissue or cotton balls. Prim Dressing: Promogran Prisma Matrix, 4.34 (sq in) (silver collagen) Every Other Day/15 Days ary Discharge Instructions: Moisten collagen with saline or hydrogel Secondary Dressing: Woven Gauze Sponge, Non-Sterile 4x4 in (Generic) Every Other Day/15 Days Discharge Instructions: Apply over primary dressing as directed. Secondary Dressing: Zetuvit Plus Silicone Border Dressing 4x4 (in/in) (Generic) Every Other Day/15 Days Discharge Instructions: Apply silicone border over primary dressing as directed. Wound #3 - Lower Leg Wound Laterality: Right, Medial Cleanser: Soap and Water 1 x Per Week/30 Days Discharge Instructions: May shower and wash wound with dial antibacterial soap and water prior to dressing change. Cleanser: Wound Cleanser 1 x Per Week/30 Days Discharge Instructions: Cleanse the wound with wound cleanser prior to applying a clean dressing using gauze sponges, not tissue or cotton balls. Prim Dressing: Sorbalgon AG Dressing 2x2 (in/in) 1 x Per Week/30 Days ary Discharge Instructions: Apply to wound bed as instructed Secondary Dressing: ABD Pad, 8x10 1 x Per Week/30 Days Discharge Instructions: Apply over primary dressing as directed. Secured With: Elastic Bandage 4 inch (ACE bandage) 1 x Per Week/30 Days Discharge Instructions: Secure with ACE bandage as directed. Secured With: [employer], 4.5x3.1 (in/yd) 1 x Per Week/30 Days Discharge Instructions: Secure with Kerlix as directed. Patient Medications llergies: penicillin, Plaquenil, digoxin, hydroxychloroquine, alendronate sodium, gluten A Notifications Medication Indication Start End 02/09/2023 lidocaine DOSE topical 4 % cream - cream topical Electronic Signature(s) Signed: 02/09/2023 11:29:12 AM By: Fredirick Maudlin MD FACS Entered By: Fredirick Maudlin on 02/09/2023  08:57:02 -------------------------------------------------------------------------------- Problem List Details Patient Name: Date of Service: [name]. 02/09/2023 8:30 A M Medical Record Number: [id number] Patient Account Number: [id number] Date of Birth/Sex: Treating RN: 1946/09/19 (77 y.o. 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 09/20/2022 No Yes without evidence of necrosis L97.812 Non-pressure chronic ulcer of other part of right lower leg with fat layer 12/28/2022 No Yes [place], Danniel [name] ([id number]) [phone].pdf Page 6 of 11 exposed [id number] Chronic systolic (congestive) heart failure 09/20/2022 No  Yes M06.9 Rheumatoid arthritis, unspecified 09/20/2022 No Yes Z79.52 Long term (current) use of systemic steroids 09/20/2022 No Yes I70.90 Unspecified atherosclerosis 09/20/2022 No Yes 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 02/09/2023 8:30 A M Medical Record Number: [id number] Patient Account Number: [id number] Date of Birth/Sex: Treating RN: August 22, 1946 (77 y.o. F) Primary Care Provider: Marcy Panning Other Clinician: Referring  Provider: Treating Provider/Extender: Lacie Draft, ELA INE Weeks in Treatment: 20 Subjective Chief Complaint Information obtained from Patient Patient seen for complaints of Non-Healing Wounds. History of Present Illness (HPI) ADMISSION 09/20/2022 This is a 77 year old woman with a past medical history significant for congestive heart failure, atrial fibrillation, rheumatoid arthritis on long-term steroid treatment, CMML currently receiving chemotherapy and moderate malnutrition. She presents to clinic today with a wound on the dorsum of her right foot. She says it started out as a bruise and then developed a bump which subsequently broke open. She says she was sent to our clinic by her oncologist. She is not diabetic and does not smoke. ABI in clinic today was 1.55. She has had formal segmental arterial Dopplers done, a little over a year ago, which did not show any evidence of occlusive vascular disease. On the dorsal aspect of the right foot, there is a wound with a lot of old hematoma, slough, eschar, and and nonviable subcutaneous tissue. T the best of my o ability to discern, it does appear to involve the muscle layer, but there is no evidence of necrosis. The wound is pouring serous fluid; the patient has 3+ pitting edema to the bilateral lower extremities. 09/29/2022: The patient came to clinic today with nothing but a T overlying her wound. She is accompanied by her husband today. She has not elfa communicated with her cardiologist regarding her fluid overload. She still has 3+ pitting edema bilaterally. The wound is still pouring serous fluid, although not [place], [name] ([id number]) [phone].pdf Page 7 of 11 quite as effusively as at her initial visit. There is still quite a bit of nonviable tissue and hematoma present. 10/13; patient's wound on the right lateral lower leg his eschared over and may well be on its way to healing. The real  problem here is on the right dorsal foot just proximal to her toes. The patient is still puzzled by the cause of this. Looking at a picture on the patient's smart phone there is a suggestion that this might have been a hematoma which seems to fit what Dr. [name] had felt on her initial evaluation. They have been using iodoform packing kerlix and Coban. 10/20; right lower leg is healed. We are approved for snap VAC on the right foot. We will try to arrange home health but until then she is going to need a nurse visit on Tuesday 10/20/2022: It has been 2 weeks since I have seen the wound. It has improved significantly since then. She is currently in a snap VAC and I think this has made quite a difference. The overall wound measurements are smaller. There is no massive drainage, as there had been previously. There is some good granulation tissue forming underneath a  bit of slough. 10/30/2022: The wound continues to improve with use of the snap VAC. The swelling in her foot is better. The undermining continues to close in. There is a layer of slough overlying good granulation tissue. 11/06/2022: The wound has contracted further. The undermining is still present but the wound surface is better and her edema control is good. 11/13/2022: The wound is smaller again and the undermining has decreased as well. There is slough accumulation over a nice bed of granulation tissue. 11/20/2022: We did not have any snap VAC devices in clinic last week so the wound was just packed. It did well and is smaller with less undermining today. There is still slough present on the surface. 11/29/2022: The wound has contracted considerably and the undermining has resolved. There is still some slough on the wound surface. 12/13; slough on the surface of the wound required debridement however afterwards the granulation looks healthy the wound has been contracting. She has been using a snap VAC with collagen 12/13/2022: The wound  was too small and superficial last week and the snap VAC was discontinued. They have been using Hydrofera Blue due to the hypertrophic granulation tissue. The wound has a little bit of slough on the surface. 12/28/2022: The wound on her dorsal foot continues to contract and is quite superficial at this point. There is a little bit of slough on the surface. Unfortunately, she struck her right medial leg on a hard surface on Christmas, resulting in a laceration. It was sutured in the emergency room. She just came from having the sutures removed. Overall, I do not think the skin is likely to survive, but for now there is no grossly open ulcer at this site. 01/03/2023: Her edema is fairly significant today and not at all well-controlled. The dorsal foot wound appears deeper, but this may be an illusion secondary to the swelling. There is a fair amount of slough accumulation in the wound. The medial leg wound, as expected, shows signs of skin necrosis and partial dehiscence. There is hematoma and necrotic fat visible. 01/10/2023: She removed her wrap about 4 days ago; despite this, both wounds are smaller and cleaner. There is still slough accumulation in both sites. 01/18/2023: She came in with her wrap on today but once it was removed, her leg began to visibly swell; it was quite an interesting phenomenon to observe. I suspect that she has poor oncotic pressure due to her low albumin. As result, her edema control is suboptimal. Both wounds are smaller with just a little bit of slough present. 01/26/2023: Both wounds are considerably smaller today. Light slough on both surfaces. Small amount of hypertrophic granulation tissue on the dorsal foot wound. 02/02/2023: Both wounds continue to contract. There is some slough and eschar on the medial leg and a little bit of slough on the dorsal foot. Edema control is much better this week. 02/09/2023: The dorsal foot wound is nearly closed. The medial leg wound is  substantially smaller. There is slough on the dorsal foot and slough and eschar on the medial leg. Edema control remains good. Patient History Information obtained from Patient. Family History Unknown History, Diabetes - Father, Heart Disease - Father, No family history of Cancer. Social History Former smoker - quit 2003, Marital Status - Married, Alcohol Use - Daily, Drug Use - Prior History, Caffeine Use - Daily. Medical History Eyes Denies history of Cataracts, Glaucoma, Optic Neuritis Ear/Nose/Mouth/Throat Denies history of Chronic sinus problems/congestion, Middle ear problems Respiratory Patient has history  of Asthma, Chronic Obstructive Pulmonary Disease (COPD) Cardiovascular Patient has history of Congestive Heart Failure, Hypertension Denies history of Coronary Artery Disease, Deep Vein Thrombosis, Hypotension, Myocardial Infarction Endocrine Denies history of Type I Diabetes, Type II Diabetes Immunological Denies history of Lupus Erythematosus, Raynaudoos, Scleroderma Integumentary (Skin) Denies history of History of Burn Musculoskeletal Patient has history of Rheumatoid Arthritis Denies history of Gout Neurologic Patient has history of Neuropathy Oncologic Patient has history of Received Chemotherapy - currently receiving Hospitalization/Surgery History - reverse shoulder arthroplasty Rt- 2018. Medical A Surgical History Notes nd QUIANNA, [name] ([id number]) [id number] Page 8 of 11 Cardiovascular AFIB Genitourinary Stage 3 CKD Objective Constitutional no acute distress. Vitals Time Taken: 8:33 AM, Height: 64 in, Weight: 111 lbs, BMI: 19.1, Temperature: 98.2 F, Pulse: 69 bpm, Respiratory Rate: 18 breaths/min, Blood Pressure: 129/68 mmHg. Respiratory Normal work of breathing on room air. General Notes: 02/09/2023: The dorsal foot wound is nearly closed. The medial leg wound is substantially smaller. There is slough on the dorsal  foot and slough and eschar on the medial leg. Edema control remains good. Integumentary (Hair, Skin) Wound #1 status is Open. Original cause of wound was Blister. The date acquired was: 08/23/2022. The wound has been in treatment 20 weeks. The wound is located on the Right,Dorsal Foot. The wound measures 0.2cm length x 0.2cm width x 0.1cm depth; 0.031cm^2 area and 0.003cm^3 volume. There is Fat Layer (Subcutaneous Tissue) exposed. There is no tunneling or undermining noted. There is a medium amount of serosanguineous drainage noted. The wound margin is distinct with the outline attached to the wound base. There is large (67-100%) pink granulation within the wound bed. There is a small (1-33%) amount of necrotic tissue within the wound bed including Adherent Slough. The periwound skin appearance had no abnormalities noted for texture. The periwound skin appearance exhibited: Dry/Scaly. The periwound skin appearance did not exhibit: Erythema. Periwound temperature was noted as No Abnormality. Wound #3 status is Open. Original cause of wound was Laceration. The date acquired was: 12/18/2022. The wound has been in treatment 6 weeks. The wound is located on the Right,Medial Lower Leg. The wound measures 1cm length x 0.7cm width x 0.1cm depth; 0.55cm^2 area and 0.055cm^3 volume. There is Fat Layer (Subcutaneous Tissue) exposed. There is no tunneling or undermining noted. There is a medium amount of serosanguineous drainage noted. The wound margin is distinct with the outline attached to the wound base. There is large (67-100%) red granulation within the wound bed. There is a small (1-33%) amount of necrotic tissue within the wound bed including Adherent Slough. The periwound skin appearance had no abnormalities noted for texture. The periwound skin appearance had no abnormalities noted for moisture. The periwound skin appearance exhibited: Hemosiderin Staining. Periwound temperature was noted as  No Abnormality. 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 There was a Selective/Open Wound Non-Viable Tissue Debridement with a total area of 0.04 sq cm performed by Fredirick Maudlin, MD. With the following instrument(s): Curette to remove Non-Viable tissue/material. Material removed includes Eschar and Slough and after achieving pain control using Lidocaine 4% T opical Solution. No  specimens were taken. A time out was conducted at 08:44, prior to the start of the procedure. A Minimum amount of bleeding was controlled with Pressure. The procedure was tolerated well. Post Debridement Measurements: 0.2cm length x 0.2cm width x 0.1cm depth; 0.003cm^3 volume. Character of Wound/Ulcer Post Debridement is improved. Post procedure Diagnosis Wound #1: Same as Pre-Procedure General Notes: scribed for Dr. [name] by Adline Peals, RN. Wound #3 Pre-procedure diagnosis of Wound #3 is a Trauma, Other located on the Right,Medial Lower Leg . There was a Selective/Open Wound Non-Viable Tissue Debridement with a total area of 0.7 sq cm performed by Fredirick Maudlin, MD. With the following instrument(s): Curette to remove Non-Viable tissue/material. Material removed includes Eschar and Slough and after achieving pain control using Lidocaine 4% Topical Solution. No specimens were taken. A time out was conducted at 08:44, prior to the start of the procedure. A Minimum amount of bleeding was controlled with  Pressure. The procedure was tolerated well. Post Debridement Measurements: 1cm length x 0.7cm width x 0.1cm depth; 0.055cm^3 volume. Character of Wound/Ulcer Post Debridement is improved. Post procedure Diagnosis Wound #3: Same as Pre-Procedure [name], [name] [name] ([id number]) [phone].pdf Page 9 of 11 General Notes: scribed for Dr. [name] by Adline Peals, RN. Plan Follow-up Appointments: Return Appointment in 1 week. - Dr. [name] Room 2 Anesthetic: (In clinic) Topical Lidocaine 4% applied to wound bed Bathing/ Shower/ Hygiene: Other Bathing/Shower/Hygiene Orders/Instructions: - Please do not get LEG WRAPS on Right leg wet. Sponge bathe or use a cast protector on right leg if showering/bathing. Edema Control - Lymphedema / SCD / Other: Avoid standing for long periods of time. Patient to wear own compression stockings every day. Compression stocking or Garment 20-30 mm/Hg pressure to: - left leg The following medication(s) was prescribed: lidocaine topical 4 % cream cream topical was prescribed at facility WOUND #1: - Foot Wound Laterality: Dorsal, Right Cleanser: Soap and Water Every Other Day/15 Days Discharge Instructions: May shower and wash wound with dial antibacterial soap and water prior to dressing change. Cleanser: Wound Cleanser (Generic) Every Other Day/15 Days Discharge Instructions: Cleanse the wound with wound cleanser prior to applying a clean dressing using gauze sponges, not tissue or cotton balls. Prim Dressing: Promogran Prisma Matrix, 4.34 (sq in) (silver collagen) Every Other Day/15 Days ary Discharge Instructions: Moisten collagen with saline or hydrogel Secondary Dressing: Woven Gauze Sponge, Non-Sterile 4x4 in (Generic) Every Other Day/15 Days Discharge Instructions: Apply over primary dressing as directed. Secondary Dressing: Zetuvit Plus Silicone Border Dressing 4x4 (in/in) (Generic) Every Other Day/15 Days Discharge Instructions:  Apply silicone border over primary dressing as directed. WOUND #3: - Lower Leg Wound Laterality: Right, Medial Cleanser: Soap and Water 1 x Per Week/30 Days Discharge Instructions: May shower and wash wound with dial antibacterial soap and water prior to dressing change. Cleanser: Wound Cleanser 1 x Per Week/30 Days Discharge Instructions: Cleanse the wound with wound cleanser prior to applying a clean dressing using gauze sponges, not tissue or cotton balls. Prim Dressing: Sorbalgon AG Dressing 2x2 (in/in) 1 x Per Week/30 Days ary Discharge Instructions: Apply to wound bed as instructed Secondary Dressing: ABD Pad, 8x10 1 x Per Week/30 Days Discharge Instructions: Apply over primary dressing as directed. Secured With: Elastic Bandage 4 inch (ACE bandage) 1 x Per Week/30 Days Discharge Instructions: Secure with ACE bandage as directed. Secured With: [employer], 4.5x3.1 (in/yd) 1 x Per Week/30 Days Discharge Instructions: Secure with Kerlix as directed. 02/09/2023: The dorsal foot wound is nearly closed. The  medial leg wound is substantially smaller. There is slough on the dorsal foot and slough and eschar on the medial leg. Edema control remains good. I used a curette to debride slough from the foot and slough and eschar from the leg. We will use silver alginate to both sites and apply Kerlix and Ace bandage compression. Follow-up in 1 week. I anticipate the foot will likely be healed at that time. Electronic Signature(s) Signed: 02/09/2023 8:57:40 AM By: Fredirick Maudlin MD FACS Entered By: Fredirick Maudlin on 02/09/2023 08:57:40 -------------------------------------------------------------------------------- HxROS Details Patient Name: Date of Service: [name]. 02/09/2023 8:30 A M Medical Record Number: [id number] Patient Account Number: [id number] Date of Birth/Sex: Treating RN: [birth year] (77 y.o. F) Primary Care Provider: Marcy Panning Other Clinician: Referring  Provider: Treating Provider/Extender: Lacie Draft, ELA INE Weeks in Treatment: 20 Information Obtained From Patient Eyes Medical HistoryBILLIE, [name] ([id number]) [id number] Page 10 of 11 Negative for: Cataracts; Glaucoma; Optic Neuritis Ear/Nose/Mouth/Throat Medical History: Negative for: Chronic sinus problems/congestion; Middle ear problems Respiratory Medical History: Positive for: Asthma; Chronic Obstructive Pulmonary Disease (COPD) Cardiovascular Medical History: Positive for: Congestive Heart Failure; Hypertension Negative for: Coronary Artery Disease; Deep Vein Thrombosis; Hypotension; Myocardial Infarction Past Medical History Notes: AFIB Endocrine Medical History: Negative for: Type I Diabetes; Type II Diabetes Genitourinary Medical History: Past Medical History Notes: Stage 3 CKD Immunological Medical History: Negative for: Lupus Erythematosus; Raynauds; Scleroderma Integumentary (Skin) Medical History: Negative for: History of Burn Musculoskeletal Medical History: Positive for: Rheumatoid Arthritis Negative for: Gout Neurologic Medical History: Positive for: Neuropathy Oncologic Medical History: Positive for: Received Chemotherapy - currently receiving Immunizations Pneumococcal Vaccine: Received Pneumococcal Vaccination: Yes Received Pneumococcal Vaccination On or After 60th Birthday: Yes Implantable Devices None Hospitalization / Surgery History Type of Hospitalization/Surgery reverse shoulder arthroplasty Rt- 2018 Family and Social History Unknown History: Yes; Cancer: No; Diabetes: Yes - Father; Heart Disease: Yes - Father; Former smoker - quit 2003; Marital Status - Married; Alcohol Use: Daily; Drug Use: Prior History; Caffeine Use: Daily; Financial Concerns: No; Food, Clothing or Shelter Needs: No; Support System Lacking: No; Transportation Concerns: No Electronic Signature(s) Signed: 02/09/2023  11:29:12 AM By: Fredirick Maudlin MD FACS [name], Signed: 02/09/2023 11:29:12 AM By: Fredirick Maudlin MD FACS [name] ([id number]) [id number] Page 11 of 11 Entered By: Fredirick Maudlin on 02/09/2023 08:54:53 -------------------------------------------------------------------------------- SuperBill Details Patient Name: Date of Service: [name], [name] 02/09/2023 Medical Record Number: [id number] Patient Account Number: [id number] Date of Birth/Sex: Treating RN: 02-24-1946 (77 y.o. 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 Facility Procedures : CPT4 Code: [id number] Description: [id number] - DEBRIDE WOUND 1ST 20 SQ CM OR < ICD-10 Diagnosis Description 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 wit L97.812 Non-pressure chronic ulcer of other part of right lower leg with fat  layer exposed Modifier: hout evidence of n Quantity: 1 ecrosis Physician Procedures : CPT4 Code Description Modifier [id number] - WC PHYS LEVEL 3 - EST PT 25 ICD-10 Diagnosis Description L97.515  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 evidence of ne L97.812 Non-pressure chronic ulcer of  other part of right lower leg with fat layer exposed [id number] Chronic systolic (congestive) heart  failure Z79.52 Long term (current) use of systemic steroids Quantity: 1 crosis : [id number] - WC PHYS DEBR WO ANESTH 20 SQ CM ICD-10 Diagnosis Description 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

## 2023-02-10 NOTE — Progress Notes (Signed)
[name], [name] ([id number]) [id number] Page 1 of 9 Visit Report for 02/09/2023 Arrival Information Details Patient Name: Date of Service: [name], [name] 02/09/2023 8:30 A M Medical Record Number: [id number] Patient Account Number: [id number] Date of Birth/Sex: Treating RN: 01/04/1946 (77 y.o. [name] Primary Care Cruz Devilla: Judie Bonus INE Other Clinician: Referring Obadiah Dennard: Treating Dani Danis/Extender: Lacie Draft, ELA INE Weeks in Treatment: 20 Visit Information History Since Last Visit Added or deleted any medications: No Patient Arrived: Wheel Chair Any new allergies or adverse reactions: No Arrival Time: 08:32 Had a fall or experienced change in No Accompanied By: self activities of daily living that may affect Transfer Assistance: Manual risk of falls: Patient Identification Verified: Yes Signs or symptoms of abuse/neglect since last visito No Secondary Verification Process Completed: Yes Hospitalized since last visit: No Patient Requires Transmission-Based Precautions: No Implantable device outside of the clinic excluding No Patient Has Alerts: No cellular tissue based products placed in the center since last visit: Has Dressing in Place as Prescribed: Yes Pain Present Now: No Electronic Signature(s) Signed: 02/09/2023 4:31:34 PM By: Adline Peals Entered By: Adline Peals on 02/09/2023 08:32:44 -------------------------------------------------------------------------------- Encounter Discharge Information Details Patient Name: Date of Service: [name] RO [name]. 02/09/2023 8:30 A M Medical Record Number: [id number] Patient Account Number: [id number] Date of Birth/Sex: Treating RN: Mar 20, 1946 (77 y.o. [name] Primary Care Rommie Dunn: Judie Bonus INE Other Clinician: Referring Arath Kaigler: Treating Adarsh Mundorf/Extender: Lacie Draft, ELA INE Weeks in Treatment: 20 Encounter  Discharge Information Items Post Procedure Vitals Discharge Condition: Stable Temperature (F): 98.2 Ambulatory Status: Wheelchair Pulse (bpm): 69 Discharge Destination: Home Respiratory Rate (breaths/min): 18 Transportation: Private Auto Blood Pressure (mmHg): 129/68 Accompanied By: self Schedule Follow-up Appointment: Yes Clinical Summary of Care: Patient Declined Electronic Signature(s) Signed: 02/09/2023 4:31:34 PM By: Adline Peals Entered By: Adline Peals on 02/09/2023 08:47:57 [employer], [name] ([id number].pdf Page 2 of 9 -------------------------------------------------------------------------------- Lower Extremity Assessment Details Patient Name: Date of Service: [name], [name] 02/09/2023 8:30 A M Medical Record Number: [id number] Patient Account Number: [id number] Date of Birth/Sex: Treating RN: December 05, 1946 (77 y.o. [name] Primary Care Isadore Palecek: Judie Bonus INE Other Clinician: Referring Torrance Frech: Treating Circe Chilton/Extender: Lacie Draft, ELA INE Weeks in Treatment: 20 Edema Assessment Assessed: [Left: No] [Right: No] Edema: [Left: Sherry] [Right: o] Calf Left: Right: Point of Measurement: From Medial Instep 27 cm Ankle Left: Right: Point of Measurement: From Medial Instep 18.3 cm Vascular Assessment Pulses: Dorsalis Pedis Palpable: [Right:Yes] Electronic Signature(s) Signed: 02/09/2023 4:31:34 PM By: Adline Peals Entered By: Adline Peals on 02/09/2023 08:36:07 -------------------------------------------------------------------------------- Multi Wound Chart Details Patient Name: Date of Service: [name] RO [name]. 02/09/2023 8:30 A M Medical Record Number: [id number] Patient Account Number: [id number] Date of Birth/Sex: Treating RN: 1946-09-05 (77 y.o. F) Primary Care Chinmayi Rumer: Judie Bonus INE Other Clinician: Referring Jarl Sellitto: Treating Colleen Donahoe/Extender: Lacie Draft, ELA INE Weeks in Treatment: 20 Vital Signs Height(in): 64 Pulse(bpm): 69 Weight(lbs): 111 Blood Pressure(mmHg): 129/68 Body Mass Index(BMI): 19.1 Temperature(F): 98.2 Respiratory Rate(breaths/min): 18 [1:Photos:] [Sherry/A:Sherry/A [id number] Page 3 of 9] Right, Dorsal Foot Right, Medial Lower Leg Sherry/A Wound Location: Blister Laceration Sherry/A Wounding Event: Lymphedema Trauma, Other Sherry/A Primary Etiology: Asthma, Chronic Obstructive Asthma, Chronic Obstructive Sherry/A Comorbid History: Pulmonary Disease (COPD), Pulmonary Disease (COPD), Congestive Heart Failure, Congestive Heart Failure, Hypertension, Rheumatoid Arthritis, Hypertension, Rheumatoid Arthritis, Neuropathy, Received Chemotherapy Neuropathy, Received Chemotherapy 08/23/2022 12/18/2022 Sherry/A Date Acquired: 20 6 Sherry/A Weeks of Treatment: Open Open Sherry/A Wound Status:  No No Sherry/A Wound Recurrence: 0.2x0.2x0.1 1x0.7x0.1 Sherry/A Measurements L x W x D (cm) 0.031 0.55 Sherry/A A (cm) : rea 0.003 0.055 Sherry/A Volume (cm) : 97.00% 94.90% Sherry/A % Reduction in A rea: 99.50% 94.90% Sherry/A % Reduction in Volume: Full Thickness Without Exposed Full Thickness Without Exposed Sherry/A Classification: Support Structures Support Structures Medium Medium Sherry/A Exudate A mount: Serosanguineous Serosanguineous Sherry/A Exudate Type: red, brown red, brown Sherry/A Exudate Color: Distinct, outline attached Distinct, outline attached Sherry/A Wound Margin: Large (67-100%) Large (67-100%) Sherry/A Granulation A mount: Pink Red Sherry/A Granulation Quality: Small (1-33%) Small (1-33%) Sherry/A Necrotic A mount: Fat Layer (Subcutaneous Tissue): Yes Fat Layer (Subcutaneous Tissue): Yes Sherry/A Exposed Structures: Fascia: No Fascia: No Tendon: No Tendon: No Muscle: No Muscle: No Joint: No Joint: No Bone: No Bone: No Medium (34-66%) Medium (34-66%) Sherry/A Epithelialization: Debridement - Selective/Open Wound Debridement - Selective/Open Wound  Sherry/A Debridement: Pre-procedure Verification/Time Out 08:44 08:44 Sherry/A Taken: Lidocaine 4% Topical Solution Lidocaine 4% Topical Solution Sherry/A Pain Control: Necrotic/Eschar, Psychologist, prison and probation services, Slough Sherry/A Tissue Debrided: Non-Viable Tissue Non-Viable Tissue Sherry/A Level: 0.04 0.7 Sherry/A Debridement A (sq cm): rea Curette Curette Sherry/A Instrument: Minimum Minimum Sherry/A Bleeding: Pressure Pressure Sherry/A Hemostasis A chieved: Procedure was tolerated well Procedure was tolerated well Sherry/A Debridement Treatment Response: 0.2x0.2x0.1 1x0.7x0.1 Sherry/A Post Debridement Measurements L x W x D (cm) 0.003 0.055 Sherry/A Post Debridement Volume: (cm) No Abnormalities Noted No Abnormalities Noted Sherry/A Periwound Skin Texture: Dry/Scaly: Yes No Abnormalities Noted Sherry/A Periwound Skin Moisture: Erythema: No Hemosiderin Staining: Yes Sherry/A Periwound Skin Color: No Abnormality No Abnormality Sherry/A Temperature: Debridement Debridement Sherry/A Procedures Performed: Treatment Notes Wound #1 (Foot) Wound Laterality: Dorsal, Right Cleanser Soap and Water Discharge Instruction: May shower and wash wound with dial antibacterial soap and water prior to dressing change. Wound Cleanser Discharge Instruction: Cleanse the wound with wound cleanser prior to applying a clean dressing using gauze sponges, not tissue or cotton balls. Peri-Wound Care Topical Primary Dressing Promogran Prisma Matrix, 4.34 (sq in) (silver collagen) Discharge Instruction: Moisten collagen with saline or hydrogel Secondary Dressing Woven Gauze Sponge, Non-Sterile 4x4 in Discharge Instruction: Apply over primary dressing as directed. Zetuvit Plus Silicone Border Dressing 4x4 (in/in) Discharge Instruction: Apply silicone border over primary dressing as directed. Secured With [employer], Rakeb [name] ([id number]) [id number] Page 4 of 9 Compression Stockings Add-Ons Wound #3 (Lower Leg) Wound Laterality:  Right, Medial Cleanser Soap and Water Discharge Instruction: May shower and wash wound with dial antibacterial soap and water prior to dressing change. Wound Cleanser Discharge Instruction: Cleanse the wound with wound cleanser prior to applying a clean dressing using gauze sponges, not tissue or cotton balls. Peri-Wound Care Topical Primary Dressing Sorbalgon AG Dressing 2x2 (in/in) Discharge Instruction: Apply to wound bed as instructed Secondary Dressing ABD Pad, 8x10 Discharge Instruction: Apply over primary dressing as directed. Secured With Elastic Bandage 4 inch (ACE bandage) Discharge Instruction: Secure with ACE bandage as directed. Kerlix Roll Sterile, 4.5x3.1 (in/yd) Discharge Instruction: Secure with Kerlix as directed. Compression Wrap Compression Stockings Add-Ons Electronic Signature(s) Signed: 02/09/2023 8:54:06 AM By: Fredirick Maudlin MD FACS Entered By: Fredirick Maudlin on 02/09/2023 08:54:05 -------------------------------------------------------------------------------- Multi-Disciplinary Care Plan Details Patient Name: Date of Service: [name] RO [name]. 02/09/2023 8:30 A M Medical Record Number: [id number] Patient Account Number: [id number] Date of Birth/Sex: Treating RN: [birth year] (77 y.o. [name] Primary Care Nielle Duford: Judie Bonus INE Other Clinician: Referring Celes Dedic: Treating Nocole Zammit/Extender: Lacie Draft, ELA INE Weeks in Treatment: 20 Active Inactive Peripheral  Neuropathy Nursing Diagnoses: Potential alteration in peripheral tissue perfusion (select prior to confirmation of diagnosis) Goals: Patient/caregiver will verbalize understanding of disease process and disease management Date Initiated: 09/20/2022 Target Resolution Date: 03/25/2023 Goal Status: Active Interventions: Provide education on Management of Neuropathy and Related Ulcers MACAULEY, BICK [name] ([id number]) [phone].pdf Page 5  of 9 Treatment Activities: Patient referred for customized footwear/offloading : 09/20/2022 Notes: Wound/Skin Impairment Nursing Diagnoses: Knowledge deficit related to ulceration/compromised skin integrity Goals: Ulcer/skin breakdown will have a volume reduction of 30% by week 4 Date Initiated: 09/20/2022 Date Inactivated: 11/29/2022 Target Resolution Date: 11/30/2022 Goal Status: Met Ulcer/skin breakdown will have a volume reduction of 50% by week 8 Date Initiated: 11/29/2022 Target Resolution Date: 03/25/2023 Goal Status: Active Interventions: Assess ulceration(s) every visit Treatment Activities: Skin care regimen initiated : 09/20/2022 Topical wound management initiated : 09/20/2022 Notes: Electronic Signature(s) Signed: 02/09/2023 4:31:34 PM By: Adline Peals Entered By: Adline Peals on 02/09/2023 08:46:54 -------------------------------------------------------------------------------- Pain Assessment Details Patient Name: Date of Service: [name] RO [name]. 02/09/2023 8:30 A M Medical Record Number: [id number] Patient Account Number: [id number] Date of Birth/Sex: Treating RN: 1946/08/22 (77 y.o. [name] Primary Care Zaryah Seckel: Judie Bonus INE Other Clinician: Referring Cielo Arias: Treating Yashas Camilli/Extender: Lacie Draft, ELA INE Weeks in Treatment: 20 Active Problems Location of Pain Severity and Description of Pain Patient Has Paino No Site Locations Rate the pain. Current Pain Level: 0 Pain Management and Medication Current Pain Management: ANGLEA, COLFLESH ([id number]) [id number] Page 6 of 9 Electronic Signature(s) Signed: 02/09/2023 4:31:34 PM By: Sabas Sous By: Adline Peals on 02/09/2023 08:32:53 -------------------------------------------------------------------------------- Patient/Caregiver Education Details Patient Name: Date of Service: Alroy Dust  2/16/2024andnbsp8:30 A M Medical Record Number: [id number] Patient Account Number: [id number] Date of Birth/Gender: Treating RN: 1946/04/14 (77 y.o. [name] Primary Care Physician: Judie Bonus INE Other Clinician: Referring Physician: Treating Physician/Extender: Lacie Draft, ELA INE Weeks in Treatment: 20 Education Assessment Education Provided To: Patient Education Topics Provided Wound/Skin Impairment: Methods: Explain/Verbal Responses: Reinforcements needed, State content correctly Electronic Signature(s) Signed: 02/09/2023 4:31:34 PM By: Adline Peals Entered By: Adline Peals on 02/09/2023 08:47:05 -------------------------------------------------------------------------------- Wound Assessment Details Patient Name: Date of Service: [name] RO [name]. 02/09/2023 8:30 A M Medical Record Number: [id number] Patient Account Number: [id number] Date of Birth/Sex: Treating RN: [birth year] (77 y.o. [name] Primary Care Ovidio Steele: Judie Bonus INE Other Clinician: Referring Caylan Schifano: Treating Alicja Everitt/Extender: Lacie Draft, ELA INE Weeks in Treatment: 20 Wound Status Wound Number: 1 Primary Lymphedema Etiology: Wound Location: Right, Dorsal Foot Wound Open Wounding Event: Blister Status: Date Acquired: 08/23/2022 Comorbid Asthma, Chronic Obstructive Pulmonary Disease (COPD), Weeks Of Treatment: 20 History: Congestive Heart Failure, Hypertension, Rheumatoid Arthritis, Clustered Wound: No Neuropathy, Received Chemotherapy Photos RENIYA, [name] ([id number]) [phone].pdf Page 7 of 9 Wound Measurements Length: (cm) 0.2 Width: (cm) 0.2 Depth: (cm) 0.1 Area: (cm) 0.031 Volume: (cm) 0.003 % Reduction in Area: 97% % Reduction in Volume: 99.5% Epithelialization: Medium (34-66%) Tunneling: No Undermining: No Wound Description Classification: Full Thickness Without Exposed Support  Structures Wound Margin: Distinct, outline attached Exudate Amount: Medium Exudate Type: Serosanguineous Exudate Color: red, brown Foul Odor After Cleansing: No Slough/Fibrino Yes Wound Bed Granulation Amount: Large (67-100%) Exposed Structure Granulation Quality: Pink Fascia Exposed: No Necrotic Amount: Small (1-33%) Fat Layer (Subcutaneous Tissue) Exposed: Yes Necrotic Quality: Adherent Slough Tendon Exposed: No Muscle Exposed: No Joint Exposed: No Bone Exposed: No Periwound Skin Texture Texture Color No Abnormalities Noted: Yes No Abnormalities Noted: No  Erythema: No Moisture No Abnormalities Noted: No Temperature / Pain Dry / Scaly: Yes Temperature: No Abnormality Treatment Notes Wound #1 (Foot) Wound Laterality: Dorsal, Right Cleanser Soap and Water Discharge Instruction: May shower and wash wound with dial antibacterial soap and water prior to dressing change. Wound Cleanser Discharge Instruction: Cleanse the wound with wound cleanser prior to applying a clean dressing using gauze sponges, not tissue or cotton balls. Peri-Wound Care Topical Primary Dressing Promogran Prisma Matrix, 4.34 (sq in) (silver collagen) Discharge Instruction: Moisten collagen with saline or hydrogel Secondary Dressing Woven Gauze Sponge, Non-Sterile 4x4 in Discharge Instruction: Apply over primary dressing as directed. Zetuvit Plus Silicone Border Dressing 4x4 (in/in) Discharge Instruction: Apply silicone border over primary dressing as directed. Secured With Compression Wrap Compression Stockings Add-Ons MERTIS, EYESTONE ([id number]) [id number] Page 8 of 9 Electronic Signature(s) Signed: 02/09/2023 4:31:34 PM By: Sabas Sous By: Adline Peals on 02/09/2023 08:41:36 -------------------------------------------------------------------------------- Wound Assessment Details Patient Name: Date of Service: [name], [name] RO [name]. 02/09/2023 8:30  A M Medical Record Number: [id number] Patient Account Number: [id number] Date of Birth/Sex: Treating RN: 10-09-1946 (77 y.o. [name] Primary Care Lebron Nauert: Judie Bonus INE Other Clinician: Referring Oluwademilade Kellett: Treating Dacari Beckstrand/Extender: Lacie Draft, ELA INE Weeks in Treatment: 20 Wound Status Wound Number: 3 Primary Trauma, Other Etiology: Wound Location: Right, Medial Lower Leg Wound Open Wounding Event: Laceration Status: Date Acquired: 12/18/2022 Comorbid Asthma, Chronic Obstructive Pulmonary Disease (COPD), Weeks Of Treatment: 6 History: Congestive Heart Failure, Hypertension, Rheumatoid Arthritis, Clustered Wound: No Neuropathy, Received Chemotherapy Photos Wound Measurements Length: (cm) 1 Width: (cm) 0.7 Depth: (cm) 0.1 Area: (cm) 0.55 Volume: (cm) 0.055 % Reduction in Area: 94.9% % Reduction in Volume: 94.9% Epithelialization: Medium (34-66%) Tunneling: No Undermining: No Wound Description Classification: Full Thickness Without Exposed Support Structures Wound Margin: Distinct, outline attached Exudate Amount: Medium Exudate Type: Serosanguineous Exudate Color: red, brown Foul Odor After Cleansing: No Slough/Fibrino Yes Wound Bed Granulation Amount: Large (67-100%) Exposed Structure Granulation Quality: Red Fascia Exposed: No Necrotic Amount: Small (1-33%) Fat Layer (Subcutaneous Tissue) Exposed: Yes Necrotic Quality: Adherent Slough Tendon Exposed: No Muscle Exposed: No Joint Exposed: No Bone Exposed: No Periwound Skin Texture Texture Color No Abnormalities Noted: Yes No Abnormalities Noted: No Hemosiderin Staining: Yes Moisture No Abnormalities Noted: Yes Temperature / Pain Temperature: No Abnormality Gagliano, Ellinor [name] ([id number]) [phone].pdf Page 9 of 9 Treatment Notes Wound #3 (Lower Leg) Wound Laterality: Right, Medial Cleanser Soap and Water Discharge Instruction: May shower and wash  wound with dial antibacterial soap and water prior to dressing change. Wound Cleanser Discharge Instruction: Cleanse the wound with wound cleanser prior to applying a clean dressing using gauze sponges, not tissue or cotton balls. Peri-Wound Care Topical Primary Dressing Sorbalgon AG Dressing 2x2 (in/in) Discharge Instruction: Apply to wound bed as instructed Secondary Dressing ABD Pad, 8x10 Discharge Instruction: Apply over primary dressing as directed. Secured With Elastic Bandage 4 inch (ACE bandage) Discharge Instruction: Secure with ACE bandage as directed. Kerlix Roll Sterile, 4.5x3.1 (in/yd) Discharge Instruction: Secure with Kerlix as directed. Compression Wrap Compression Stockings Add-Ons Electronic Signature(s) Signed: 02/09/2023 4:31:34 PM By: Adline Peals Entered By: Adline Peals on 02/09/2023 08:42:06 -------------------------------------------------------------------------------- Vitals Details Patient Name: Date of Service: [name] RO [name]. 02/09/2023 8:30 A M Medical Record Number: [id number] Patient Account Number: [id number] Date of Birth/Sex: Treating RN: 04-18-1946 (77 y.o. [name] Primary Care Fayetta Sorenson: Judie Bonus INE Other Clinician: Referring Laveah Gloster: Treating Saryn Cherry/Extender: Lacie Draft, ELA INE Weeks in Treatment: 20 Vital  Signs Time Taken: 08:33 Temperature (F): 98.2 Height (in): 64 Pulse (bpm): 69 Weight (lbs): 111 Respiratory Rate (breaths/min): 18 Body Mass Index (BMI): 19.1 Blood Pressure (mmHg): 129/68 Reference Range: 80 - 120 mg / dl Electronic Signature(s) Signed: 02/09/2023 4:31:34 PM By: Adline Peals Entered By: Adline Peals on 02/09/2023 08:33:20

## 2023-02-16 ENCOUNTER — Encounter (HOSPITAL_BASED_OUTPATIENT_CLINIC_OR_DEPARTMENT_OTHER): Payer: Medicare Other | Admitting: General Surgery

## 2023-02-16 DIAGNOSIS — I890 Lymphedema, not elsewhere classified: Secondary | ICD-10-CM | POA: Diagnosis not present

## 2023-02-16 DIAGNOSIS — L97812 Non-pressure chronic ulcer of other part of right lower leg with fat layer exposed: Secondary | ICD-10-CM | POA: Diagnosis not present

## 2023-02-16 DIAGNOSIS — J4489 Other specified chronic obstructive pulmonary disease: Secondary | ICD-10-CM | POA: Diagnosis not present

## 2023-02-16 DIAGNOSIS — I130 Hypertensive heart and chronic kidney disease with heart failure and stage 1 through stage 4 chronic kidney disease, or unspecified chronic kidney disease: Secondary | ICD-10-CM | POA: Diagnosis not present

## 2023-02-16 DIAGNOSIS — S81811A Laceration without foreign body, right lower leg, initial encounter: Secondary | ICD-10-CM | POA: Diagnosis not present

## 2023-02-16 DIAGNOSIS — S91301A Unspecified open wound, right foot, initial encounter: Secondary | ICD-10-CM | POA: Diagnosis not present

## 2023-02-16 DIAGNOSIS — L97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 evidence of necrosis: Secondary | ICD-10-CM | POA: Diagnosis not present

## 2023-02-16 DIAGNOSIS — L97822 Non-pressure chronic ulcer of other part of left lower leg with fat layer exposed: Secondary | ICD-10-CM | POA: Diagnosis not present

## 2023-02-16 DIAGNOSIS — S81812A Laceration without foreign body, left lower leg, initial encounter: Secondary | ICD-10-CM | POA: Diagnosis not present

## 2023-02-17 NOTE — Progress Notes (Signed)
[name], [name] ([id number]) [id number] Page 1 of 10 Visit Report for 02/16/2023 Arrival Information Details Patient Name: Date of Service: [name], [name] 02/16/2023 2:15 PM Medical Record Number: [id number] Patient Account Number: [id number] Date of Birth/Sex: Treating RN: [name] (77 y.o. [name] Primary Care [name]: [name] INE Other Clinician: Referring [name]: Treating [name]: [name], ELA INE Weeks in Treatment: 21 Visit Information History Since Last Visit Added or deleted any medications: No Patient Arrived: Wheel Chair Any new allergies or adverse reactions: No Arrival Time: 14:10 Had a fall or experienced change in No Accompanied By: spouse activities of daily living that may affect Transfer Assistance: Manual risk of falls: Patient Requires Transmission-Based Precautions: No Signs or symptoms of abuse/neglect since last visito No Patient Has Alerts: No Hospitalized since last visit: No Implantable device outside of the clinic excluding No cellular tissue based products placed in the center since last visit: Pain Present Now: No Electronic Signature(s) Signed: 02/16/2023 4:48:17 PM By: Sherry Catholic RN Entered By: [name] on 02/16/2023 14:20:33 -------------------------------------------------------------------------------- Clinic Level of Care Assessment Details Patient Name: Date of Service: [name], [name] 02/16/2023 2:15 PM Medical Record Number: [id number] Patient Account Number: [id number] Date of Birth/Sex: Treating RN: [name] (77 y.o. [name] Primary Care [name]: [name] INE Other Clinician: Referring [name]: Treating [name]/Extender: [name], ELA INE Weeks in Treatment: 21 Clinic Level of Care Assessment Items TOOL 4 Quantity Score X- 1 0 Use when only an EandM is performed on FOLLOW-UP visit ASSESSMENTS - Nursing  Assessment / Reassessment X- 1 10 Reassessment of Co-morbidities (includes updates in patient status) X- 1 5 Reassessment of Adherence to Treatment Plan ASSESSMENTS - Wound and Skin A ssessment / Reassessment X - Simple Wound Assessment / Reassessment - one wound 1 5 '[]'$  - 0 Complex Wound Assessment / Reassessment - multiple wounds '[]'$  - 0 Dermatologic / Skin Assessment (not related to wound area) ASSESSMENTS - Focused Assessment '[]'$  - 0 Circumferential Edema Measurements - multi extremities '[]'$  - 0 Nutritional Assessment / Counseling / Intervention '[]'$  - 0 Lower Extremity Assessment (monofilament, tuning fork, pulses) '[]'$  - 0 Peripheral Arterial Disease Assessment (using hand held doppler) ASSESSMENTS - Ostomy and/or Continence Assessment and Care '[]'$  - 0 Incontinence Assessment and Management '[]'$  - 0 Ostomy Care Assessment and Management (repouching, etc.) PROCESS - Coordination of Care X - Simple Patient / Family Education for ongoing care 1 [address] [place] ([id number]) [id number] Page 2 of 10 '[]'$  - 0 Complex (extensive) Patient / Family Education for ongoing care X- 1 10 Staff obtains Consents, Records, T Results / Process Orders est X- 1 10 Staff telephones HHA, Nursing Homes / Clarify orders / etc '[]'$  - 0 Routine Transfer to another Facility (non-emergent condition) '[]'$  - 0 Routine Hospital Admission (non-emergent condition) '[]'$  - 0 New Admissions / Biomedical engineer / Ordering NPWT Apligraf, etc. , '[]'$  - 0 Emergency Hospital Admission (emergent condition) X- 1 10 Simple Discharge Coordination '[]'$  - 0 Complex (extensive) Discharge Coordination PROCESS - Special Needs '[]'$  - 0 Pediatric / Minor Patient Management '[]'$  - 0 Isolation Patient Management '[]'$  - 0 Hearing / Language / Visual special needs '[]'$  - 0 Assessment of Community assistance (transportation, D/C planning, etc.) '[]'$  - 0 Additional assistance / Altered mentation '[]'$  -  0 Support Surface(s) Assessment (bed, cushion, seat, etc.) INTERVENTIONS - Wound Cleansing / Measurement X - Simple Wound Cleansing - one wound 1 5 '[]'$  - 0 Complex Wound  Cleansing - multiple wounds X- 1 5 Wound Imaging (photographs - any number of wounds) '[]'$  - 0 Wound Tracing (instead of photographs) X- 1 5 Simple Wound Measurement - one wound '[]'$  - 0 Complex Wound Measurement - multiple wounds INTERVENTIONS - Wound Dressings X - Small Wound Dressing one or multiple wounds 1 10 '[]'$  - 0 Medium Wound Dressing one or multiple wounds '[]'$  - 0 Large Wound Dressing one or multiple wounds '[]'$  - 0 Application of Medications - topical '[]'$  - 0 Application of Medications - injection INTERVENTIONS - Miscellaneous '[]'$  - 0 External ear exam '[]'$  - 0 Specimen Collection (cultures, biopsies, blood, body fluids, etc.) '[]'$  - 0 Specimen(s) / Culture(s) sent or taken to Lab for analysis '[]'$  - 0 Patient Transfer (multiple staff / Civil Service fast streamer / Similar devices) '[]'$  - 0 Simple Staple / Suture removal (25 or less) '[]'$  - 0 Complex Staple / Suture removal (26 or more) '[]'$  - 0 Hypo / Hyperglycemic Management (close monitor of Blood Glucose) '[]'$  - 0 Ankle / Brachial Index (ABI) - do not check if billed separately X- 1 5 Vital Signs Has the patient been seen at the hospital within the last three years: Yes Total Score: 95 Level Of Care: New/Established - Level 3 Electronic Signature(s) Signed: 02/16/2023 4:48:17 PM By: Sherry Catholic RN Entered By: [name] on 02/16/2023 16:33:21 [place], [name] ([id number]) [id number] Page 3 of 10 -------------------------------------------------------------------------------- Encounter Discharge Information Details Patient Name: Date of Service: [name], [name] 02/16/2023 2:15 PM Medical Record Number: [id number] Patient Account Number: [id number] Date of Birth/Sex: Treating RN: [name] (77 y.o. [name] Primary Care  Jatia Musa: Marcy Panning Other Clinician: Referring Montserrat Shek: Treating Galileo Colello/Extender: [name], ELA INE Weeks in Treatment: 21 Encounter Discharge Information Items Discharge Condition: Stable Ambulatory Status: Wheelchair Discharge Destination: Home Transportation: Private Auto Accompanied By: spouse Schedule Follow-up Appointment: Yes Clinical Summary of Care: Patient Declined Electronic Signature(s) Signed: 02/16/2023 4:48:17 PM By: Sherry Catholic RN Entered By: [name] on 02/16/2023 16:35:22 -------------------------------------------------------------------------------- Lower Extremity Assessment Details Patient Name: Date of Service: [name], [name] 02/16/2023 2:15 PM Medical Record Number: [id number] Patient Account Number: [id number] Date of Birth/Sex: Treating RN: [name] (77 y.o. [name] Primary Care Jaysin Gayler: Marcy Panning Other Clinician: Referring Ly Wass: Treating Jillaine Waren/Extender: [name], ELA INE Weeks in Treatment: 21 Edema Assessment Assessed: [Left: No] [Right: No] Edema: [Left: Yes] [Right: No] Calf Left: Right: Point of Measurement: From Medial Instep 28.3 cm 27 cm Ankle Left: Right: Point of Measurement: From Medial Instep 23 cm 18.3 cm Vascular Assessment Pulses: Dorsalis Pedis Palpable: [Left:Yes] [Right:Yes] Electronic Signature(s) Signed: 02/16/2023 4:48:17 PM By: Sherry Catholic RN Entered By: [name] on 02/16/2023 14:22:40 -------------------------------------------------------------------------------- Multi Wound Chart Details Patient Name: Date of Service: Sherry Masson RO Ovidio Hanger. 02/16/2023 2:15 PM Medical Record Number: [id number] Patient Account Number: [id number] Date of Birth/Sex: Treating RN: [birth year][name] (77 y.o. F) Primary Care Deja Pisarski: Marcy Panning Other Clinician: Referring Yeni Jiggetts: Treating Eames Dibiasio/Extender: [name], ELA INE Weeks  in Treatment: [address] [place] ([id number]) [phone].pdf Page 4 of 10 Vital Signs Height(in): 64 Pulse(bpm): 63 Weight(lbs): 111 Blood Pressure(mmHg): 136/68 Body Mass Index(BMI): 19.1 Temperature(F): 97.8 Respiratory Rate(breaths/min): 16 [1:Photos:] Right, Dorsal Foot Right, Medial Lower Leg Left, Medial Lower Leg Wound Location: Blister Laceration Skin Tear/Laceration Wounding Event: Lymphedema Trauma, Other Skin Tear Primary Etiology: Asthma, Chronic Obstructive Asthma, Chronic Obstructive Asthma, Chronic Obstructive Comorbid History: Pulmonary Disease (COPD), Pulmonary Disease (COPD), Pulmonary Disease (  COPD), Congestive Heart Failure, Congestive Heart Failure, Congestive Heart Failure, Hypertension, Rheumatoid Arthritis, Hypertension, Rheumatoid Arthritis, Hypertension, Rheumatoid Arthritis, Neuropathy, Received Chemotherapy Neuropathy, Received Chemotherapy Neuropathy, Received Chemotherapy 08/23/2022 12/18/2022 02/15/2023 Date Acquired: 21 7 0 Weeks of Treatment: Healed - Epithelialized Healed - Epithelialized Open Wound Status: No No No Wound Recurrence: 0x0x0 0x0x0 4x0.5x0.1 Measurements L x W x D (cm) 0 0 1.571 A (cm) : rea 0 0 0.157 Volume (cm) : 100.00% 100.00% N/A % Reduction in Area: 100.00% 100.00% N/A % Reduction in Volume: Full Thickness Without Exposed Full Thickness Without Exposed Partial Thickness Classification: Support Structures Support Structures None Present None Present N/A Exudate A mount: Distinct, outline attached Distinct, outline attached N/A Wound Margin: None Present (0%) None Present (0%) Small (1-33%) Granulation Amount: N/A N/A Red Granulation Quality: None Present (0%) None Present (0%) Large (67-100%) Necrotic Amount: N/A N/A Eschar, Adherent Slough Necrotic Tissue: Fascia: No Fascia: No Fat Layer (Subcutaneous Tissue): Yes Exposed Structures: Fat Layer (Subcutaneous Tissue): No Fat  Layer (Subcutaneous Tissue): No Fascia: No Tendon: No Tendon: No Tendon: No Muscle: No Muscle: No Muscle: No Joint: No Joint: No Joint: No Bone: No Bone: No Bone: No Large (67-100%) Medium (34-66%) None Epithelialization: No Abnormalities Noted Excoriation: No Scarring: Yes Periwound Skin Texture: Induration: No Callus: No Crepitus: No Rash: No Scarring: No Dry/Scaly: Yes Maceration: No No Abnormalities Noted Periwound Skin Moisture: Dry/Scaly: No Erythema: No Atrophie Blanche: No No Abnormalities Noted Periwound Skin Color: Cyanosis: No Ecchymosis: No Erythema: No Hemosiderin Staining: No Mottled: No Pallor: No Rubor: No No Abnormality No Abnormality No Abnormality Temperature: Treatment Notes Electronic Signature(s) Signed: 02/16/2023 2:42:50 PM By: Fredirick Maudlin MD FACS Entered By: Fredirick Maudlin on 02/16/2023 14:42:50 [place] Details -------------------------------------------------------------------------------- [name] ([id number]) [id number] Page 5 of 10 Patient Name: Date of Service: [name], [name] 02/16/2023 2:15 PM Medical Record Number: [id number] Patient Account Number: [id number] Date of Birth/Sex: Treating RN: [birth year] (77 y.o. [name] Primary Care Callin Ashe: Marcy Panning Other Clinician: Referring Dava Rensch: Treating Kaelea Gathright/Extender: [name], ELA INE Weeks in Treatment: 21 Active Inactive Peripheral Neuropathy Nursing Diagnoses: Potential alteration in peripheral tissue perfusion (select prior to confirmation of diagnosis) Goals: Patient/caregiver will verbalize understanding of disease process and disease management Date Initiated: 09/20/2022 Target Resolution Date: 03/25/2023 Goal Status: Active Interventions: Provide education on Management of Neuropathy and Related Ulcers Treatment Activities: Patient referred for customized  footwear/offloading : 09/20/2022 Notes: Wound/Skin Impairment Nursing Diagnoses: Knowledge deficit related to ulceration/compromised skin integrity Goals: Ulcer/skin breakdown will have a volume reduction of 30% by week 4 Date Initiated: 09/20/2022 Date Inactivated: 11/29/2022 Target Resolution Date: 11/30/2022 Goal Status: Met Ulcer/skin breakdown will have a volume reduction of 50% by week 8 Date Initiated: 11/29/2022 Target Resolution Date: 03/25/2023 Goal Status: Active Interventions: Assess ulceration(s) every visit Treatment Activities: Skin care regimen initiated : 09/20/2022 Topical wound management initiated : 09/20/2022 Notes: Electronic Signature(s) Signed: 02/16/2023 4:48:17 PM By: Sherry Catholic RN Entered By: [name] on 02/16/2023 16:32:06 -------------------------------------------------------------------------------- Pain Assessment Details Patient Name: Date of Service: [name], Sherry [name] 02/16/2023 2:15 PM Medical Record Number: [id number] Patient Account Number: [id number] Date of Birth/Sex: Treating RN: [birth year] [name] (77 y.o. [name] Primary Care Scout Gumbs: Marcy Panning Other Clinician: Referring Jordan Caraveo: Treating Elise Gladden/Extender: [name], ELA INE Weeks in Treatment: 21 Active Problems Location of Pain Severity and Description of Pain Patient Has Paino No Site Locations [place], [place] ([id number]) [phone].pdf Page 6 of 10 Pain Management  and Medication Current Pain Management: Electronic Signature(s) Signed: 02/16/2023 4:48:17 PM By: Sherry Catholic RN Entered By: [name] on 02/16/2023 14:21:11 -------------------------------------------------------------------------------- Patient/Caregiver Education Details Patient Name: Date of Service: [name] 2/23/2024andnbsp2:15 PM Medical Record Number: [id number] Patient Account Number: [id number] Date of Birth/Gender: Treating  RN: [name] (77 y.o. [name] Primary Care Physician: [name] INE Other Clinician: Referring Physician: Treating Physician/Extender: [name], ELA INE Weeks in Treatment: 21 Education Assessment Education Provided To: Patient Education Topics Provided Wound/Skin Impairment: Methods: Explain/Verbal Responses: Return demonstration correctly Electronic Signature(s) Signed: 02/16/2023 4:48:17 PM By: Sherry Catholic RN Entered By: [name] on 02/16/2023 16:32:23 -------------------------------------------------------------------------------- Wound Assessment Details Patient Name: Date of Service: Sherry Masson RO Ovidio Hanger. 02/16/2023 2:15 PM Medical Record Number: [id number] Patient Account Number: [id number] Date of Birth/Sex: Treating RN: [birth year] (77 y.o. [name] Primary Care Coby Antrobus: Marcy Panning Other Clinician: Referring Azhar Knope: Treating Jalaya Sarver/Extender: [name], ELA INE Weeks in Treatment: 21 Wound Status Wound Number: 1 Primary Lymphedema Etiology: Wound Location: Right, Dorsal Foot Wound Healed - Epithelialized Wounding Event: Blister Status: Date Acquired: 08/23/2022 Comorbid Asthma, Chronic Obstructive Pulmonary Disease (COPD), Weeks Of Treatment: 21 History: Congestive Heart Failure, Hypertension, Rheumatoid Arthritis, Clustered Wound: No Neuropathy, Received Chemotherapy Garriga, Jacalynn H ([id number]) [id number] Page 7 of 10 Photos Wound Measurements Length: (cm) Width: (cm) Depth: (cm) Area: (cm) Volume: (cm) 0 % Reduction in Area: 100% 0 % Reduction in Volume: 100% 0 Epithelialization: Large (67-100%) 0 Tunneling: No 0 Undermining: No Wound Description Classification: Full Thickness Without Exposed Support Structures Wound Margin: Distinct, outline attached Exudate Amount: None Present Foul Odor After Cleansing: No Slough/Fibrino No Wound  Bed Granulation Amount: None Present (0%) Exposed Structure Necrotic Amount: None Present (0%) Fascia Exposed: No Fat Layer (Subcutaneous Tissue) Exposed: No Tendon Exposed: No Muscle Exposed: No Joint Exposed: No Bone Exposed: No Periwound Skin Texture Texture Color No Abnormalities Noted: Yes No Abnormalities Noted: Yes Moisture Temperature / Pain No Abnormalities Noted: Yes Temperature: No Abnormality Electronic Signature(s) Signed: 02/16/2023 4:48:17 PM By: Sherry Catholic RN Entered By: [name] on 02/16/2023 14:32:20 -------------------------------------------------------------------------------- Wound Assessment Details Patient Name: Date of Service: [name], Sherry RO Ovidio Hanger. 02/16/2023 2:15 PM Medical Record Number: [id number] Patient Account Number: [id number] Date of Birth/Sex: Treating RN: [birth year] (77 y.o. [name] Primary Care Augusta Mirkin: Marcy Panning Other Clinician: Referring Jalan Fariss: Treating Clifford Coudriet/Extender: [name], ELA INE Weeks in Treatment: 21 Wound Status Wound Number: 3 Primary Trauma, Other Etiology: Wound Location: Right, Medial Lower Leg Wound Healed - Epithelialized Wounding Event: Laceration Status: Date Acquired: 12/18/2022 Comorbid Asthma, Chronic Obstructive Pulmonary Disease (COPD), Weeks Of Treatment: 7 History: Congestive Heart Failure, Hypertension, Rheumatoid Arthritis, Clustered Wound: No Neuropathy, Received Chemotherapy Photos CHITRA, IGOU H ([id number]) [id number] Page 8 of 10 Wound Measurements Length: (cm) Width: (cm) Depth: (cm) Area: (cm) Volume: (cm) 0 % Reduction in Area: 100% 0 % Reduction in Volume: 100% 0 Epithelialization: Medium (34-66%) 0 Tunneling: No 0 Undermining: No Wound Description Classification: Full Thickness Without Exposed Support Structures Wound Margin: Distinct, outline attached Exudate Amount: None Present Foul Odor After  Cleansing: No Slough/Fibrino No Wound Bed Granulation Amount: None Present (0%) Exposed Structure Necrotic Amount: None Present (0%) Fascia Exposed: No Fat Layer (Subcutaneous Tissue) Exposed: No Tendon Exposed: No Muscle Exposed: No Joint Exposed: No Bone Exposed: No Periwound Skin Texture Texture Color No Abnormalities Noted: Yes No Abnormalities Noted: Yes Moisture Temperature / Pain No Abnormalities Noted: Yes Temperature: No Abnormality  Electronic Signature(s) Signed: 02/16/2023 4:48:17 PM By: Sherry Catholic RN Entered By: [name] on 02/16/2023 14:33:20 -------------------------------------------------------------------------------- Wound Assessment Details Patient Name: Date of Service: LAYNI, PELTZ 02/16/2023 2:15 PM Medical Record Number: [id number] Patient Account Number: [id number] Date of Birth/Sex: Treating RN: [birth year] (78 y.o. F) Primary Care [place] Doig: Marcy Panning Other Clinician: Referring Diamonte Stavely: Treating Layia Walla/Extender: [name], ELA INE Weeks in Treatment: 21 Wound Status Wound Number: 4 Primary Skin T ear Etiology: Wound Location: Left, Medial Lower Leg Wound Open Wounding Event: Skin Tear/Laceration Status: Date Acquired: 02/15/2023 Comorbid Asthma, Chronic Obstructive Pulmonary Disease (COPD), Weeks Of Treatment: 0 History: Congestive Heart Failure, Hypertension, Rheumatoid Arthritis, Clustered Wound: No Neuropathy, Received Chemotherapy Photos KENYONNA, JASPER H ([id number]) [id number] Page 9 of 10 Wound Measurements Length: (cm) 4 Width: (cm) 0.5 Depth: (cm) 0.1 Area: (cm) 1.571 Volume: (cm) 0.157 % Reduction in Area: % Reduction in Volume: Epithelialization: None Tunneling: No Undermining: No Wound Description Classification: Partial Thickness Foul Odor After Cleansing: No Slough/Fibrino Yes Wound Bed Granulation Amount: Small (1-33%) Exposed  Structure Granulation Quality: Red Fascia Exposed: No Necrotic Amount: Large (67-100%) Fat Layer (Subcutaneous Tissue) Exposed: Yes Necrotic Quality: Eschar, Adherent Slough Tendon Exposed: No Muscle Exposed: No Joint Exposed: No Bone Exposed: No Periwound Skin Texture Texture Color No Abnormalities Noted: No No Abnormalities Noted: Yes Scarring: Yes Temperature / Pain Temperature: No Abnormality Moisture No Abnormalities Noted: Yes Treatment Notes Wound #4 (Lower Leg) Wound Laterality: Left, Medial Cleanser Soap and Water Discharge Instruction: May shower and wash wound with dial antibacterial soap and water prior to dressing change. Wound Cleanser Discharge Instruction: Cleanse the wound with wound cleanser prior to applying a clean dressing using gauze sponges, not tissue or cotton balls. Peri-Wound Care Topical Primary Dressing Sorbalgon AG Dressing, 4x4 (in/in) Discharge Instruction: Apply to wound bed as instructed Secondary Dressing Woven Gauze Sponge, Non-Sterile 4x4 in Discharge Instruction: Apply over primary dressing as directed. Secured With Transpore Surgical Tape, 2x10 (in/yd) Discharge Instruction: Secure dressing with tape as directed. Compression Wrap Kerlix Roll 4.5x3.1 (in/yd) Discharge Instruction: Apply Kerlix and Coban compression as directed. Coban Self-Adherent Wrap 4x5 (in/yd) Discharge Instruction: Apply over Kerlix as directed. ACQUANETTA, PRASKA ([id number]) [id number] Page 10 of 10 Compression Stockings Add-Ons Electronic Signature(s) Signed: 02/16/2023 4:48:17 PM By: Sherry Catholic RN Entered By: [name] on 02/16/2023 14:23:44 -------------------------------------------------------------------------------- Vitals Details Patient Name: Date of Service: Sherry Masson [name] 02/16/2023 2:15 PM Medical Record Number: [id number] Patient Account Number: [id number] Date of Birth/Sex: Treating RN: [name] (77  y.o. [name] Primary Care Storm Sovine: [name] INE Other Clinician: Referring Deshone Lyssy: Treating Larson Limones/Extender: [name], ELA INE Weeks in Treatment: 21 Vital Signs Time Taken: 14:11 Temperature (F): 97.8 Height (in): 64 Pulse (bpm): 63 Weight (lbs): 111 Respiratory Rate (breaths/min): 16 Body Mass Index (BMI): 19.1 Blood Pressure (mmHg): 136/68 Reference Range: 80 - 120 mg / dl Electronic Signature(s) Signed: 02/16/2023 4:48:17 PM By: Sherry Catholic RN Entered By: [name] on 02/16/2023 14:21:03

## 2023-02-17 NOTE — Progress Notes (Signed)
RICA, RIBAR ([id number]) [id number] Page 1 of 9 Visit Report for 2/[name] Chief Complaint Document Details Patient Name: Date of Service: [name], [name] 2/[name] 2:15 PM Medical Record Number: [id number] Patient Account Number: [id number] Date of Birth/Sex: Treating RN: [birth year]/[name] (77 y.o. F) Primary Care Provider: Marcy [name] Other Clinician: Referring Provider: Treating Provider/Extender: [name], [name] in Treatment: 21 Information Obtained from: Patient Chief Complaint Patient seen for complaints of Non-Healing Wounds. Electronic Signature(s) Signed: 2/[name] 2:42:59 PM By: Fredirick Maudlin MD FACS Entered By: Fredirick Maudlin on 02/[name] 14:42:59 -------------------------------------------------------------------------------- HPI Details Patient Name: Date of Service: [name]. 2/[name] 2:15 PM Medical Record Number: [id number] Patient Account Number: [id number] Date of Birth/Sex: Treating RN: [name] Primary Care Provider: Marcy [name] Other Clinician: Referring Provider: Treating Provider/Extender: [name], [name] in Treatment: 21 History of Present Illness HPI Description: ADMISSION 09/20/2022 This is a 77 year old woman with a past medical history significant for congestive heart failure, atrial fibrillation, rheumatoid arthritis on long-term steroid treatment, CMML currently receiving chemotherapy and moderate malnutrition. She presents to clinic today with a wound on the dorsum of her right foot. She says it started out as a bruise and then developed a bump which subsequently broke open. She says she was sent to our clinic by her oncologist. She is not diabetic and does not smoke. ABI in clinic today was 1.55. She has had formal segmental arterial Dopplers done, a little over a year ago, which did not show any evidence of occlusive vascular  disease. On the dorsal aspect of the right foot, there is a wound with a lot of old hematoma, slough, eschar, and and nonviable subcutaneous tissue. T the best of my o ability to discern, it does appear to involve the muscle layer, but there is no evidence of necrosis. The wound is pouring serous fluid; the patient has 3+ pitting edema to the bilateral lower extremities. 09/29/2022: The patient came to clinic today with nothing but a T overlying her wound. She is accompanied by her husband today. She has not elfa communicated with her cardiologist regarding her fluid overload. She still has 3+ pitting edema bilaterally. The wound is still pouring serous fluid, although not quite as effusively as at her initial visit. There is still quite a bit of nonviable tissue and hematoma present. 10/[name]; patient's wound on the right lateral lower leg his eschared over and Sherry well be on its way to healing. The real problem here is on the right dorsal foot just proximal to her toes. The patient is still puzzled by the cause of this. Looking at a picture on the patient's smart phone there is a suggestion that this might have been a hematoma which seems to fit what Dr. [name] had felt on her initial evaluation. They have been using iodoform packing kerlix and Coban. 10/20; right lower leg is healed. We are approved for snap VAC on the right foot. We will try to arrange home health but until then she is going to need a nurse visit on Tuesday 10/20/2022: It has been 2 [name] since I have seen the wound. It has improved significantly since then. She is currently in a snap VAC and I think this has made quite a difference. The overall wound measurements are smaller. There is no massive drainage, as there had been previously. There is some good granulation tissue forming underneath a bit of slough. 10/30/2022: The wound continues to  improve with use of the snap VAC. The swelling in her foot is better. The undermining  continues to close in. There is a layer of slough overlying good granulation tissue. [name]: The wound has contracted further. The undermining is still present but the wound surface is better and her edema control is good. 11/13/2022: The wound is smaller again and the undermining has decreased as well. There is slough accumulation over a nice bed of granulation tissue. 11/20/2022: We did not have any snap VAC devices in clinic last week so the wound was just packed. It did well and is smaller with less undermining today. There is still slough present on the surface. 11/29/2022: The wound has contracted considerably and the undermining has resolved. There is still some slough on the wound surface. [name]; slough on the surface of the wound required debridement however afterwards the granulation looks healthy the wound has been contracting. She has been using a snap VAC with collagen FIEBIG, [name] ([id number]) [id number] Page 2 of 9 12/13/2022: The wound was too small and superficial last week and the snap VAC was discontinued. They have been using Hydrofera Blue due to the hypertrophic granulation tissue. The wound has a little bit of slough on the surface. 12/28/2022: The wound on her dorsal foot continues to contract and is quite superficial at this point. There is a little bit of slough on the surface. Unfortunately, she struck her right medial leg on a hard surface on Christmas, resulting in a laceration. It was sutured in the emergency room. She just came from having the sutures removed. Overall, I do not think the skin is likely to survive, but for now there is no grossly open ulcer at this site. 01/03/2023: Her edema is fairly significant today and not at all well-controlled. The dorsal foot wound appears deeper, but this Sherry be an illusion secondary to the swelling. There is a fair amount of slough accumulation in the wound. The medial leg wound, as expected,  shows signs of skin necrosis and partial dehiscence. There is hematoma and necrotic fat visible. 01/10/2023: She removed her wrap about 4 days ago; despite this, both wounds are smaller and cleaner. There is still slough accumulation in both sites. 01/18/2023: She came in with her wrap on today but once it was removed, her leg began to visibly swell; it was quite an interesting phenomenon to observe. I suspect that she has poor oncotic pressure due to her low albumin. As result, her edema control is suboptimal. Both wounds are smaller with just a little bit of slough present. 01/26/2023: Both wounds are considerably smaller today. Light slough on both surfaces. Small amount of hypertrophic granulation tissue on the dorsal foot wound. 02/02/2023: Both wounds continue to contract. There is some slough and eschar on the medial leg and a little bit of slough on the dorsal foot. Edema control is much better this week. 02/09/2023: The dorsal foot wound is nearly closed. The medial leg wound is substantially smaller. There is slough on the dorsal foot and slough and eschar on the medial leg. Edema control remains good. 2/[name]: Both the right dorsal foot wound and right medial leg wound have healed. Unfortunately, she has a new laceration on her left medial lower leg with the same etiology as the now healed right medial leg wound; part of her walker cut her leg. There is a flap of skin that is partially adherent and Sherry or Sherry not survive. The fat layer is exposed but the wound  is clean. Electronic Signature(s) Signed: 2/[name] 2:44:46 PM By: Fredirick Maudlin MD FACS Entered By: Fredirick Maudlin on 02/[name] 14:44:46 -------------------------------------------------------------------------------- Physical Exam Details Patient Name: Date of Service: [name]. 2/[name] 2:15 PM Medical Record Number: [id number] Patient Account Number: [id number] Date of Birth/Sex: Treating RN: [birth year] (77  y.o. F) Primary Care Provider: Marcy [name] Other Clinician: Referring Provider: Treating Provider/Extender: [name], [name] in Treatment: 21 Constitutional . . . . no acute distress. Respiratory Normal work of breathing on room air. Notes 2/[name]: Both the right dorsal foot wound and right medial leg wound have healed. Unfortunately, she has a new laceration on her left medial lower leg. There is a flap of skin that is partially adherent and Sherry or Sherry not survive. The fat layer is exposed but the wound is clean. Electronic Signature(s) Signed: 2/[name] 2:45:41 PM By: Fredirick Maudlin MD FACS Entered By: Fredirick Maudlin on 02/[name] 14:45:41 -------------------------------------------------------------------------------- Physician Orders Details Patient Name: Date of Service: [name], [name] RO N [name]. 2/[name] 2:15 PM Medical Record Number: [id number] Patient Account Number: [id number] Date of Birth/Sex: Treating RN: [birth year] (78 y.o. [name] Primary Care Provider: Marcy [name] Other Clinician: Referring Provider: Treating Provider/Extender: [name], [name] in Treatment: 49 Verbal / Phone Orders: No Diagnosis Coding ICD-10 Coding Code Description [phone] Non-pressure chronic ulcer of other part of left lower leg with fat layer exposed [place], [place] ([id number]) [id number] Page 3 of 9 [id number] Chronic systolic (congestive) heart failure M06.9 Rheumatoid arthritis, unspecified Z79.52 Long term (current) use of systemic steroids I70.90 Unspecified atherosclerosis 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 - Dr. [name] Room 1 Return A ( Monday 02/26/23 at 11am Room 3) Anesthetic (In clinic) Topical Lidocaine 4% applied to wound bed Bathing/ Shower/ Hygiene Other Bathing/Shower/Hygiene Orders/Instructions: -  Please do not get LEG WRAPS on left leg wet. Sponge bathe or use a cast protector on left leg if showering/bathing. Edema Control - Lymphedema / SCD / Other Left Lower Extremity Avoid standing for long periods of time. Patient to wear own compression stockings every day. Compression stocking or Garment 20-30 mm/Hg pressure to: - left leg (when healed) Wound Treatment Wound #4 - Lower Leg Wound Laterality: Left, Medial Cleanser: Soap and Water 1 x Per Week/30 Days Discharge Instructions: Sherry shower and wash wound with dial antibacterial soap and water prior to dressing change. Cleanser: Wound Cleanser 1 x Per Week/30 Days Discharge Instructions: Cleanse the wound with wound cleanser prior to applying a clean dressing using gauze sponges, not tissue or cotton balls. Prim Dressing: Sorbal[employer] Dressing, 4x4 (in/in) 1 x Per Week/30 Days ary Discharge Instructions: Apply to wound bed as instructed Secondary Dressing: Woven Gauze Sponge, Non-Sterile 4x4 in 1 x Per Week/30 Days Discharge Instructions: Apply over primary dressing as directed. Secured With: Transpore Surgical Tape, 2x10 (in/yd) 1 x Per Week/30 Days Discharge Instructions: Secure dressing with tape as directed. Compression Wrap: Kerlix Roll 4.5x3.1 (in/yd) 1 x Per Week/30 Days Discharge Instructions: Apply Kerlix and Coban compression as directed. Compression Wrap: Coban Self-Adherent Wrap 4x5 (in/yd) 1 x Per Week/30 Days Discharge Instructions: Apply over Kerlix as directed. Electronic Signature(s) Signed: 2/[name] 3:28:32 PM By: Fredirick Maudlin MD FACS Entered By: Fredirick Maudlin on 02/[name] 14:45:56 -------------------------------------------------------------------------------- Problem List Details Patient Name: Date of Service: [name]. 2/[name] 2:15 PM Medical Record Number: [id number] Patient Account Number: [id number] Date of Birth/Sex: Treating RN: [name], [name] (  77 y.o. F) Primary Care Provider:  Marcy [name] Other Clinician: Referring Provider: Treating Provider/Extender: [name], [name] in Treatment: 21 Active Problems ICD-10 Encounter Code Description Active Date MDM Diagnosis L97.822 Non-pressure chronic ulcer of other part of left lower leg with fat layer exposed 2/[name] No Yes [id number] Chronic systolic (congestive) heart failure 09/20/2022 No Yes [name], [name] ([id number]) [id number] Page 4 of 9 M06.9 Rheumatoid arthritis, unspecified 09/20/2022 No Yes Z79.52 Long term (current) use of systemic steroids 09/20/2022 No Yes I70.90 Unspecified atherosclerosis 09/20/2022 No Yes 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 09/20/2022 No Yes Inactive Problems Resolved Problems ICD-10 Code Description Active Date Resolved Date L97.812 Non-pressure chronic ulcer of other part of right lower leg with fat layer exposed 09/20/2022 09/20/2022 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 09/20/2022 09/20/2022 evidence of necrosis L97.812 Non-pressure chronic ulcer of other part of right lower leg with fat layer exposed 12/28/2022 12/28/2022 Electronic Signature(s) Signed: 2/[name] 2:40:47 PM By: Fredirick Maudlin MD FACS Entered By: Fredirick Maudlin on 02/[name] 14:40:47 -------------------------------------------------------------------------------- Progress Note Details Patient Name: Date of Service: [name]. 2/[name] 2:15 PM Medical Record Number: [id number] Patient Account Number: [id number] Date of Birth/Sex: Treating RN: [name], [name] (77 y.o. F) Primary Care Provider: Marcy [name] Other Clinician: Referring Provider: Treating Provider/Extender: [name], [name] in Treatment: 21 Subjective Chief Complaint Information obtained from Patient Patient seen for complaints of Non-Healing Wounds. History of Present  Illness (HPI) ADMISSION 09/20/2022 This is a 77 year old woman with a past medical history significant for congestive heart failure, atrial fibrillation, rheumatoid arthritis on long-term steroid treatment, CMML currently receiving chemotherapy and moderate malnutrition. She presents to clinic today with a wound on the dorsum of her right foot. She says it started out as a bruise and then developed a bump which subsequently broke open. She says she was sent to our clinic by her oncologist. She is not diabetic and does not smoke. ABI in clinic today was 1.55. She has had formal segmental arterial Dopplers done, a little over a year ago, which did not show any evidence of occlusive vascular disease. On the dorsal aspect of the right foot, there is a wound with a lot of old hematoma, slough, eschar, and and nonviable subcutaneous tissue. T the best of my o ability to discern, it does appear to involve the muscle layer, but there is no evidence of necrosis. The wound is pouring serous fluid; the patient has 3+ pitting edema to the bilateral lower extremities. 09/29/2022: The patient came to clinic today with nothing but a T overlying her wound. She is accompanied by her husband today. She has not elfa communicated with her cardiologist regarding her fluid overload. She still has 3+ pitting edema bilaterally. The wound is still pouring serous fluid, although not quite as effusively as at her initial visit. There is still quite a bit of nonviable tissue and hematoma present. 10/[name]; patient's wound on the right lateral lower leg his eschared over and Sherry well be on its way to healing. The real problem here is on the right dorsal foot just proximal to her toes. The patient is still puzzled by the cause of this. Looking at a picture on the patient's smart phone there is a suggestion that this might [name], [name] ([id number]) [id number] Page 5 of 9 have been a hematoma which  seems to fit what Dr. [name] had felt on her initial  evaluation. They have been using iodoform packing kerlix and Coban. 10/20; right lower leg is healed. We are approved for snap VAC on the right foot. We will try to arrange home health but until then she is going to need a nurse visit on Tuesday 10/20/2022: It has been 2 [name] since I have seen the wound. It has improved significantly since then. She is currently in a snap VAC and I think this has made quite a difference. The overall wound measurements are smaller. There is no massive drainage, as there had been previously. There is some good granulation tissue forming underneath a bit of slough. 10/30/2022: The wound continues to improve with use of the snap VAC. The swelling in her foot is better. The undermining continues to close in. There is a layer of slough overlying good granulation tissue. [name]: The wound has contracted further. The undermining is still present but the wound surface is better and her edema control is good. 11/13/2022: The wound is smaller again and the undermining has decreased as well. There is slough accumulation over a nice bed of granulation tissue. 11/20/2022: We did not have any snap VAC devices in clinic last week so the wound was just packed. It did well and is smaller with less undermining today. There is still slough present on the surface. 11/29/2022: The wound has contracted considerably and the undermining has resolved. There is still some slough on the wound surface. [name]; slough on the surface of the wound required debridement however afterwards the granulation looks healthy the wound has been contracting. She has been using a snap VAC with collagen 12/13/2022: The wound was too small and superficial last week and the snap VAC was discontinued. They have been using Hydrofera Blue due to the hypertrophic granulation tissue. The wound has a little bit of slough on the surface. 12/28/2022: The wound on her  dorsal foot continues to contract and is quite superficial at this point. There is a little bit of slough on the surface. Unfortunately, she struck her right medial leg on a hard surface on Christmas, resulting in a laceration. It was sutured in the emergency room. She just came from having the sutures removed. Overall, I do not think the skin is likely to survive, but for now there is no grossly open ulcer at this site. 01/03/2023: Her edema is fairly significant today and not at all well-controlled. The dorsal foot wound appears deeper, but this Sherry be an illusion secondary to the swelling. There is a fair amount of slough accumulation in the wound. The medial leg wound, as expected, shows signs of skin necrosis and partial dehiscence. There is hematoma and necrotic fat visible. 01/10/2023: She removed her wrap about 4 days ago; despite this, both wounds are smaller and cleaner. There is still slough accumulation in both sites. 01/18/2023: She came in with her wrap on today but once it was removed, her leg began to visibly swell; it was quite an interesting phenomenon to observe. I suspect that she has poor oncotic pressure due to her low albumin. As result, her edema control is suboptimal. Both wounds are smaller with just a little bit of slough present. 01/26/2023: Both wounds are considerably smaller today. Light slough on both surfaces. Small amount of hypertrophic granulation tissue on the dorsal foot wound. 02/02/2023: Both wounds continue to contract. There is some slough and eschar on the medial leg and a little bit of slough on the dorsal foot. Edema control is much better this week.  02/09/2023: The dorsal foot wound is nearly closed. The medial leg wound is substantially smaller. There is slough on the dorsal foot and slough and eschar on the medial leg. Edema control remains good. 2/[name]: Both the right dorsal foot wound and right medial leg wound have healed. Unfortunately, she has a new  laceration on her left medial lower leg with the same etiology as the now healed right medial leg wound; part of her walker cut her leg. There is a flap of skin that is partially adherent and Sherry or Sherry not survive. The fat layer is exposed but the wound is clean. Patient History Information obtained from Patient. Family History Unknown History, Diabetes - Father, Heart Disease - Father, No family history of Cancer. Social History Former smoker - quit 2003, Marital Status - Married, Alcohol Use - Daily, Drug Use - Prior History, Caffeine Use - Daily. Medical History Eyes Denies history of Cataracts, Glaucoma, Optic Neuritis Ear/Nose/Mouth/Throat Denies history of Chronic sinus problems/congestion, Middle ear problems Respiratory Patient has history of Asthma, Chronic Obstructive Pulmonary Disease (COPD) Cardiovascular Patient has history of Congestive Heart Failure, Hypertension Denies history of Coronary Artery Disease, Deep Vein Thrombosis, Hypotension, Myocardial Infarction Endocrine Denies history of Type I Diabetes, Type II Diabetes Immunological Denies history of Lupus Erythematosus, Raynaudoos, Scleroderma Integumentary (Skin) Denies history of History of Burn Musculoskeletal Patient has history of Rheumatoid Arthritis Denies history of Gout Neurologic Patient has history of Neuropathy Oncologic Patient has history of Received Chemotherapy - currently receiving Hospitalization/Surgery History - reverse shoulder arthroplasty Rt- 2018. Medical A Surgical History Notes nd [name], [name] ([id number]) [id number] Page 6 of 9 Cardiovascular AFIB Genitourinary Stage 3 CKD Objective Constitutional no acute distress. Vitals Time Taken: 2:11 PM, Height: 64 in, Weight: 111 lbs, BMI: 19.1, Temperature: 97.8 F, Pulse: 63 bpm, Respiratory Rate: 16 breaths/min, Blood Pressure: 136/68 mmHg. Respiratory Normal work of breathing on room  air. General Notes: 2/[name]: Both the right dorsal foot wound and right medial leg wound have healed. Unfortunately, she has a new laceration on her left medial lower leg. There is a flap of skin that is partially adherent and Sherry or Sherry not survive. The fat layer is exposed but the wound is clean. Integumentary (Hair, Skin) Wound #1 status is Healed - Epithelialized. Original cause of wound was Blister. The date acquired was: 08/23/2022. The wound has been in treatment 21 [name]. The wound is located on the Right,Dorsal Foot. The wound measures 0cm length x 0cm width x 0cm depth; 0cm^2 area and 0cm^3 volume. There is no tunneling or undermining noted. There is a none present amount of drainage noted. The wound margin is distinct with the outline attached to the wound base. There is no granulation within the wound bed. There is no necrotic tissue within the wound bed. The periwound skin appearance had no abnormalities noted for texture. The periwound skin appearance had no abnormalities noted for moisture. The periwound skin appearance had no abnormalities noted for color. Periwound temperature was noted as No Abnormality. Wound #3 status is Healed - Epithelialized. Original cause of wound was Laceration. The date acquired was: 12/18/2022. The wound has been in treatment 7 [name]. The wound is located on the Right,Medial Lower Leg. The wound measures 0cm length x 0cm width x 0cm depth; 0cm^2 area and 0cm^3 volume. There is no tunneling or undermining noted. There is a none present amount of drainage noted. The wound margin is distinct with the outline attached to the wound base. There is no  granulation within the wound bed. There is no necrotic tissue within the wound bed. The periwound skin appearance had no abnormalities noted for texture. The periwound skin appearance had no abnormalities noted for moisture. The periwound skin appearance had no abnormalities noted for color. Periwound temperature  was noted as No Abnormality. Wound #4 status is Open. Original cause of wound was Skin T ear/Laceration. The date acquired was: 02/15/2023. The wound is located on the Left,Medial Lower Leg. The wound measures 4cm length x 0.5cm width x 0.1cm depth; 1.571cm^2 area and 0.157cm^3 volume. There is Fat Layer (Subcutaneous Tissue) exposed. There is no tunneling or undermining noted. There is small (1-33%) red granulation within the wound bed. There is a large (67-100%) amount of necrotic tissue within the wound bed including Eschar and Adherent Slough. The periwound skin appearance had no abnormalities noted for moisture. The periwound skin appearance had no abnormalities noted for color. The periwound skin appearance exhibited: Scarring. Periwound temperature was noted as No Abnormality. 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 - Dr. [name] Room 1 ( Monday 02/26/23 at 11am Room 3) Anesthetic: (In clinic) Topical Lidocaine 4% applied to wound bed Bathing/ Shower/ Hygiene: Other Bathing/Shower/Hygiene Orders/Instructions: - Please do not get LEG WRAPS on left leg wet. Sponge bathe or use a cast protector on left leg if showering/bathing. Edema Control - Lymphedema / SCD / Other: Avoid standing for long periods of time. Patient to wear own compression stockings every day. Compression stocking or Garment 20-30 mm/Hg pressure to: - left leg (when healed) WOUND #4: - Lower Leg Wound Laterality: Left, Medial Cleanser: Soap and Water 1 x Per Week/30 Days Discharge Instructions: Sherry shower and wash wound with dial antibacterial soap and water prior to dressing  change. [name], [name] ([id number]) [id number] Page 7 of 9 Cleanser: Wound Cleanser 1 x Per Week/30 Days Discharge Instructions: Cleanse the wound with wound cleanser prior to applying a clean dressing using gauze sponges, not tissue or cotton balls. Prim Dressing: Sorbalgon [employer] Dressing, 4x4 (in/in) 1 x Per Week/30 Days ary Discharge Instructions: Apply to wound bed as instructed Secondary Dressing: Woven Gauze Sponge, Non-Sterile 4x4 in 1 x Per Week/30 Days Discharge Instructions: Apply over primary dressing as directed. Secured With: Transpore Surgical T ape, 2x10 (in/yd) 1 x Per Week/30 Days Discharge Instructions: Secure dressing with tape as directed. Com pression Wrap: Kerlix Roll 4.5x3.1 (in/yd) 1 x Per Week/30 Days Discharge Instructions: Apply Kerlix and Coban compression as directed. Com pression Wrap: Coban Self-Adherent Wrap 4x5 (in/yd) 1 x Per Week/30 Days Discharge Instructions: Apply over Kerlix as directed. 2/[name]: Both the right dorsal foot wound and right medial leg wound have healed. Unfortunately, she has a new laceration on her left medial lower leg with the same etiology as the now healed right medial leg wound; part of her walker cut her leg. There is a flap of skin that is partially adherent and Sherry or Sherry not survive. The fat layer is exposed but the wound is clean. The wound did not require debridement. We will monitor the skin flap and will need to excise it if it necrosis. In the meantime, we will apply silver alginate to the open areas of the new wound and apply Kerlix and Ace bandage  compression. She was advised to wear compression stocking on her right leg. Follow-up in 1 week. Electronic Signature(s) Signed: 2/[name] 2:47:52 PM By: Fredirick Maudlin MD FACS Entered By: Fredirick Maudlin on 02/[name] 14:47:51 -------------------------------------------------------------------------------- HxROS Details Patient Name: Date of  Service: [name]. 2/[name] 2:15 PM Medical Record Number: [id number] Patient Account Number: [id number] Date of Birth/Sex: Treating RN: [name] (77 y.o. F) Primary Care Provider: Marcy [name] Other Clinician: Referring Provider: Treating Provider/Extender: [name], [name] in Treatment: 21 Information Obtained From Patient Eyes Medical History: Negative for: Cataracts; Glaucoma; Optic Neuritis Ear/Nose/Mouth/Throat Medical History: Negative for: Chronic sinus problems/congestion; Middle ear problems Respiratory Medical History: Positive for: Asthma; Chronic Obstructive Pulmonary Disease (COPD) Cardiovascular Medical History: Positive for: Congestive Heart Failure; Hypertension Negative for: Coronary Artery Disease; Deep Vein Thrombosis; Hypotension; Myocardial Infarction Past Medical History Notes: AFIB Endocrine Medical History: Negative for: Type I Diabetes; Type II Diabetes Genitourinary Medical History: Past Medical History Notes: Stage 3 CKD Immunological Kadeisha, Konieczka Graceyn [name] ([id number]) [id number] Page 8 of 9 Medical History: Negative for: Lupus Erythematosus; Raynauds; Scleroderma Integumentary (Skin) Medical History: Negative for: History of Burn Musculoskeletal Medical History: Positive for: Rheumatoid Arthritis Negative for: Gout Neurologic Medical History: Positive for: Neuropathy Oncologic Medical History: Positive for: Received Chemotherapy - currently receiving Immunizations Pneumococcal Vaccine: Received Pneumococcal Vaccination: Yes Received Pneumococcal Vaccination On or After 60th Birthday: Yes Implantable Devices None Hospitalization / Surgery History Type of Hospitalization/Surgery reverse shoulder arthroplasty Rt- 2018 Family and Social History Unknown History: Yes; Cancer: No; Diabetes: Yes - Father; Heart Disease: Yes - Father; Former smoker - quit 2003; Marital  Status - Married; Alcohol Use: Daily; Drug Use: Prior History; Caffeine Use: Daily; Financial Concerns: No; Food, Clothing or Shelter Needs: No; Support System Lacking: No; Transportation Concerns: No Engineer, maintenance) Signed: 2/[name] 3:28:32 PM By: Fredirick Maudlin MD FACS Entered By: Fredirick Maudlin on 02/[name] 14:44:53 -------------------------------------------------------------------------------- SuperBill Details Patient Name: Date of Service: [name] 2/[name] Medical Record Number: [id number] Patient Account Number: [id number] Date of Birth/Sex: Treating RN: [name], [name] (77 y.o. 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 Facility Procedures : [name], [name] Code: [id number] CLORA CERRUTI ([id number]) Description: [place] VISIT-LEV 3 EST PT [id number] Modifier: [id number] Quantity: 1 [id number] Page 9 of 9 Physician Procedures : CPT4 Code Description Modifier [id number] - WC PHYS LEVEL 3 - EST PT ICD-10 Diagnosis Description L97.822 Non-pressure chronic ulcer of other part of left lower leg with fat layer exposed [id number] Chronic systolic (congestive) heart failure Z79.52  Long term (current) use of systemic steroids 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

## 2023-02-20 ENCOUNTER — Inpatient Hospital Stay: Payer: Medicare Other

## 2023-02-20 DIAGNOSIS — C931 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: Secondary | ICD-10-CM | POA: Diagnosis not present

## 2023-02-20 DIAGNOSIS — Z5111 Encounter for antineoplastic chemotherapy: Secondary | ICD-10-CM | POA: Diagnosis not present

## 2023-02-20 DIAGNOSIS — Z95828 Presence of other vascular implants and grafts: Secondary | ICD-10-CM

## 2023-02-20 LAB — CBC WITH DIFFERENTIAL (CANCER CENTER ONLY)
Abs Immature Granulocytes: 0.08 10*3/uL — ABNORMAL HIGH (ref 0.00–0.07)
Basophils Absolute: 0.1 10*3/uL (ref 0.0–0.1)
Basophils Relative: 2 %
Eosinophils Absolute: 0.1 10*3/uL (ref 0.0–0.5)
Eosinophils Relative: 1 %
HCT: 31.9 % — ABNORMAL LOW (ref 36.0–46.0)
Hemoglobin: 10.7 g/dL — ABNORMAL LOW (ref 12.0–15.0)
Immature Granulocytes: 1 %
Lymphocytes Relative: 28 %
Lymphs Abs: 1.8 10*3/uL (ref 0.7–4.0)
MCH: 35.2 pg — ABNORMAL HIGH (ref 26.0–34.0)
MCHC: 33.5 g/dL (ref 30.0–36.0)
MCV: 104.9 fL — ABNORMAL HIGH (ref 80.0–100.0)
Monocytes Absolute: 1.1 10*3/uL — ABNORMAL HIGH (ref 0.1–1.0)
Monocytes Relative: 17 %
Neutro Abs: 3.3 10*3/uL (ref 1.7–7.7)
Neutrophils Relative %: 51 %
Platelet Count: 98 10*3/uL — ABNORMAL LOW (ref 150–400)
RBC: 3.04 MIL/uL — ABNORMAL LOW (ref 3.87–5.11)
RDW: 14.7 % (ref 11.5–15.5)
WBC Count: 6.4 10*3/uL (ref 4.0–10.5)
nRBC: 0 % (ref 0.0–0.2)

## 2023-02-20 LAB — CMP (CANCER CENTER ONLY)
ALT: 18 U/L (ref 0–44)
AST: 22 U/L (ref 15–41)
Albumin: 4 g/dL (ref 3.5–5.0)
Alkaline Phosphatase: 47 U/L (ref 38–126)
Anion gap: 7 (ref 5–15)
BUN: 14 mg/dL (ref 8–23)
CO2: 29 mmol/L (ref 22–32)
Calcium: 9.1 mg/dL (ref 8.9–10.3)
Chloride: 100 mmol/L (ref 98–111)
Creatinine: 0.89 mg/dL (ref 0.44–1.00)
GFR, Estimated: >60 mL/min (ref >60)
Glucose, Bld: 101 mg/dL — ABNORMAL HIGH (ref 70–99)
Potassium: 4 mmol/L (ref 3.5–5.1)
Sodium: 136 mmol/L (ref 135–145)
Total Bilirubin: 0.7 mg/dL (ref 0.3–1.2)
Total Protein: 6.3 g/dL — ABNORMAL LOW (ref 6.5–8.1)

## 2023-02-20 MED ORDER — SODIUM CHLORIDE 0.9% FLUSH
10.0000 mL | Freq: Once | INTRAVENOUS | Status: AC
  Administered 2023-02-20: 10 mL

## 2023-02-20 MED ORDER — HEPARIN SOD (PORK) LOCK FLUSH 100 UNIT/ML IV SOLN
500.0000 [IU] | Freq: Once | INTRAVENOUS | Status: AC
  Administered 2023-02-20: 500 [IU]

## 2023-02-22 DIAGNOSIS — I10 Essential (primary) hypertension: Secondary | ICD-10-CM | POA: Diagnosis not present

## 2023-02-22 DIAGNOSIS — M069 Rheumatoid arthritis, unspecified: Secondary | ICD-10-CM | POA: Diagnosis not present

## 2023-02-22 DIAGNOSIS — Z7901 Long term (current) use of anticoagulants: Secondary | ICD-10-CM | POA: Diagnosis not present

## 2023-02-22 DIAGNOSIS — Z79899 Other long term (current) drug therapy: Secondary | ICD-10-CM | POA: Diagnosis not present

## 2023-02-22 DIAGNOSIS — I4891 Unspecified atrial fibrillation: Secondary | ICD-10-CM | POA: Diagnosis not present

## 2023-02-22 DIAGNOSIS — C931 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: Secondary | ICD-10-CM | POA: Diagnosis not present

## 2023-02-22 DIAGNOSIS — G629 Polyneuropathy, unspecified: Secondary | ICD-10-CM | POA: Diagnosis not present

## 2023-02-22 DIAGNOSIS — M810 Age-related osteoporosis without current pathological fracture: Secondary | ICD-10-CM | POA: Diagnosis not present

## 2023-02-22 DIAGNOSIS — Z7952 Long term (current) use of systemic steroids: Secondary | ICD-10-CM | POA: Diagnosis not present

## 2023-02-26 ENCOUNTER — Encounter (HOSPITAL_BASED_OUTPATIENT_CLINIC_OR_DEPARTMENT_OTHER): Payer: Medicare Other | Attending: General Surgery | Admitting: General Surgery

## 2023-02-26 DIAGNOSIS — J449 Chronic obstructive pulmonary disease, unspecified: Secondary | ICD-10-CM | POA: Insufficient documentation

## 2023-02-26 DIAGNOSIS — I4891 Unspecified atrial fibrillation: Secondary | ICD-10-CM | POA: Insufficient documentation

## 2023-02-26 DIAGNOSIS — I130 Hypertensive heart and chronic kidney disease with heart failure and stage 1 through stage 4 chronic kidney disease, or unspecified chronic kidney disease: Secondary | ICD-10-CM | POA: Diagnosis not present

## 2023-02-26 DIAGNOSIS — M069 Rheumatoid arthritis, unspecified: Secondary | ICD-10-CM | POA: Diagnosis not present

## 2023-02-26 DIAGNOSIS — N183 Chronic kidney disease, stage 3 unspecified: Secondary | ICD-10-CM | POA: Insufficient documentation

## 2023-02-26 DIAGNOSIS — I5022 Chronic systolic (congestive) heart failure: Secondary | ICD-10-CM | POA: Diagnosis not present

## 2023-02-26 DIAGNOSIS — E440 Moderate protein-calorie malnutrition: Secondary | ICD-10-CM | POA: Insufficient documentation

## 2023-02-26 DIAGNOSIS — G629 Polyneuropathy, unspecified: Secondary | ICD-10-CM | POA: Diagnosis not present

## 2023-02-26 DIAGNOSIS — C9310 Chronic myelomonocytic leukemia not having achieved remission: Secondary | ICD-10-CM | POA: Insufficient documentation

## 2023-02-26 DIAGNOSIS — L97822 Non-pressure chronic ulcer of other part of left lower leg with fat layer exposed: Secondary | ICD-10-CM | POA: Insufficient documentation

## 2023-02-26 DIAGNOSIS — S81811A Laceration without foreign body, right lower leg, initial encounter: Secondary | ICD-10-CM | POA: Diagnosis not present

## 2023-02-26 DIAGNOSIS — I890 Lymphedema, not elsewhere classified: Secondary | ICD-10-CM | POA: Diagnosis not present

## 2023-02-28 NOTE — Progress Notes (Signed)
Sherry, [name] ([id number]) [id number] Page 1 of 10 Visit Report for 02/26/2023 Chief Complaint Document Details Patient Name: Date of Service: [name], [name] 02/26/2023 11:00 A M Medical Record Number: [id number] Patient Account Number: [id number] Date of Birth/Sex: Treating RN: 1946-06-30 (77 y.o. F) Primary Care Provider: Marcy Panning Other Clinician: Referring Provider: Treating Provider/Extender: Lacie Draft, ELA INE Weeks in Treatment: 22 Information Obtained from: Patient Chief Complaint Patient seen for complaints of Non-Healing Wounds. Electronic Signature(s) Signed: 02/26/2023 12:11:53 PM By: Fredirick Maudlin MD FACS Entered By: Fredirick Maudlin on 02/26/2023 12:11:53 -------------------------------------------------------------------------------- Debridement Details Patient Name: Date of Service: [name] [name] 02/26/2023 11:00 A M Medical Record Number: [id number] Patient Account Number: [id number] Date of Birth/Sex: Treating RN: [birth year] (77 y.o. [name] Primary Care Provider: Judie Bonus INE Other Clinician: Referring Provider: Treating Provider/Extender: Lacie Draft, ELA INE Weeks in Treatment: 22 Debridement Performed for Assessment: Wound #4 Left,Medial Lower Leg Performed By: Physician Fredirick Maudlin, MD Debridement Type: Debridement Level of Consciousness (Pre-procedure): Awake and Alert Pre-procedure Verification/Time Out Yes - 11:22 Taken: Start Time: 11:22 Pain Control: Lidocaine 5% topical ointment T Area Debrided (L x W): otal 2 (cm) x 0.4 (cm) = 0.8 (cm) Tissue and other material debrided: Non-Viable, [place], Other: Hematoma and skin Level: Non-Viable Tissue Debridement Description: Selective/Open Wound Instrument: Curette Bleeding: Minimum Hemostasis Achieved: Pressure End Time: 11:23 Post Procedural Pain: 0 Response to Treatment: Procedure was tolerated  well Level of Consciousness (Post- Awake and Alert procedure): Post Debridement Measurements of Total Wound Length: (cm) 2 Width: (cm) 0.4 Depth: (cm) 0.1 Volume: (cm) 0.063 Character of Wound/Ulcer Post Debridement: Improved Post Procedure Diagnosis Same as Pre-procedure Notes Scribed for Dr. [name] by J.Scotton Electronic Signature(s) Signed: 02/26/2023 12:33:57 PM By: Fredirick Maudlin MD FACS Signed: 02/26/2023 6:24:12 PM By: Dellie Catholic RN Entered By: Dellie Catholic on 02/26/2023 11:27:01 Imogene, [name] ([id number] Page 2 of 10 -------------------------------------------------------------------------------- HPI Details Patient Name: Date of Service: Sherry, [name] 02/26/2023 11:00 A M Medical Record Number: [id number] Patient Account Number: [id number] Date of Birth/Sex: Treating RN: 1946/07/05 (77 y.o. F) Primary Care Provider: Marcy Panning Other Clinician: Referring Provider: Treating Provider/Extender: Lacie Draft, ELA INE Weeks in Treatment: 22 History of Present Illness HPI Description: ADMISSION 09/20/2022 This is a 77 year old woman with a past medical history significant for congestive heart failure, atrial fibrillation, rheumatoid arthritis on long-term steroid treatment, CMML currently receiving chemotherapy and moderate malnutrition. She presents to clinic today with a wound on the dorsum of her right foot. She says it started out as a bruise and then developed a bump which subsequently broke open. She says she was sent to our clinic by her oncologist. She is not diabetic and does not smoke. ABI in clinic today was 1.55. She has had formal segmental arterial Dopplers done, a little over a year ago, which did not show any evidence of occlusive vascular disease. On the dorsal aspect of the right foot, there is a wound with a lot of old hematoma, slough, eschar, and and nonviable subcutaneous tissue. T  the best of my o ability to discern, it does appear to involve the muscle layer, but there is no evidence of necrosis. The wound is pouring serous fluid; the patient has 3+ pitting edema to the bilateral lower extremities. 09/29/2022: The patient came to clinic today with nothing but a T overlying her wound. She is accompanied by her husband today. She has not  elfa communicated with her cardiologist regarding her fluid overload. She still has 3+ pitting edema bilaterally. The wound is still pouring serous fluid, although not quite as effusively as at her initial visit. There is still quite a bit of nonviable tissue and hematoma present. 10/13; patient's wound on the right lateral lower leg his eschared over and may well be on its way to healing. The real problem here is on the right dorsal foot just proximal to her toes. The patient is still puzzled by the cause of this. Looking at a picture on the patient's smart phone there is a suggestion that this might have been a hematoma which seems to fit what Dr. [name] had felt on her initial evaluation. They have been using iodoform packing kerlix and Coban. 10/20; right lower leg is healed. We are approved for snap VAC on the right foot. We will try to arrange home health but until then she is going to need a nurse visit on Tuesday 10/20/2022: It has been 2 weeks since I have seen the wound. It has improved significantly since then. She is currently in a snap VAC and I think this has made quite a difference. The overall wound measurements are smaller. There is no massive drainage, as there had been previously. There is some good granulation tissue forming underneath a bit of slough. 10/30/2022: The wound continues to improve with use of the snap VAC. The swelling in her foot is better. The undermining continues to close in. There is a layer of slough overlying good granulation tissue. 11/06/2022: The wound has contracted further. The undermining is still  present but the wound surface is better and her edema control is good. 11/13/2022: The wound is smaller again and the undermining has decreased as well. There is slough accumulation over a nice bed of granulation tissue. 11/20/2022: We did not have any snap VAC devices in clinic last week so the wound was just packed. It did well and is smaller with less undermining today. There is still slough present on the surface. 11/29/2022: The wound has contracted considerably and the undermining has resolved. There is still some slough on the wound surface. 12/13; slough on the surface of the wound required debridement however afterwards the granulation looks healthy the wound has been contracting. She has been using a snap VAC with collagen 12/13/2022: The wound was too small and superficial last week and the snap VAC was discontinued. They have been using Hydrofera Blue due to the hypertrophic granulation tissue. The wound has a little bit of slough on the surface. 12/28/2022: The wound on her dorsal foot continues to contract and is quite superficial at this point. There is a little bit of slough on the surface. Unfortunately, she struck her right medial leg on a hard surface on Christmas, resulting in a laceration. It was sutured in the emergency room. She just came from having the sutures removed. Overall, I do not think the skin is likely to survive, but for now there is no grossly open ulcer at this site. 01/03/2023: Her edema is fairly significant today and not at all well-controlled. The dorsal foot wound appears deeper, but this may be an illusion secondary to the swelling. There is a fair amount of slough accumulation in the wound. The medial leg wound, as expected, shows signs of skin necrosis and partial dehiscence. There is hematoma and necrotic fat visible. 01/10/2023: She removed her wrap about 4 days ago; despite this, both wounds are smaller and cleaner. There  is still slough accumulation in both  sites. 01/18/2023: She came in with her wrap on today but once it was removed, her leg began to visibly swell; it was quite an interesting phenomenon to observe. I suspect that she has poor oncotic pressure due to her low albumin. As result, her edema control is suboptimal. Both wounds are smaller with just a little bit of slough present. 01/26/2023: Both wounds are considerably smaller today. Light slough on both surfaces. Small amount of hypertrophic granulation tissue on the dorsal foot wound. 02/02/2023: Both wounds continue to contract. There is some slough and eschar on the medial leg and a little bit of slough on the dorsal foot. Edema control is much better this week. 02/09/2023: The dorsal foot wound is nearly closed. The medial leg wound is substantially smaller. There is slough on the dorsal foot and slough and eschar on the medial leg. Edema control remains good. 02/16/2023: Both the right dorsal foot wound and right medial leg wound have healed. Unfortunately, she has a new laceration on her left medial lower leg with the same etiology as the now healed right medial leg wound; part of her walker cut her leg. There is a flap of skin that is partially adherent and may or may not survive. The fat layer is exposed but the wound is clean. 02/26/2023: Most of the skin flap looks viable. The corner at the midpoint of the wound is not, however. There is some old hematoma and slough buildup. JAZLENE, HIGHMAN ([id number]) [id number] Page 3 of 10 Electronic Signature(s) Signed: 02/26/2023 12:12:36 PM By: Fredirick Maudlin MD FACS Entered By: Fredirick Maudlin on 02/26/2023 12:12:35 -------------------------------------------------------------------------------- Physical Exam Details Patient Name: Date of Service: [name] [name] 02/26/2023 11:00 A M Medical Record Number: [id number] Patient Account Number: [id number] Date of Birth/Sex: Treating RN: Apr 22, 1946 (77 y.o.  F) Primary Care Provider: Marcy Panning Other Clinician: Referring Provider: Treating Provider/Extender: Lacie Draft, ELA INE Weeks in Treatment: 22 Constitutional . . . . no acute distress. Respiratory Normal work of breathing on room air. Notes 02/26/2023: Most of the skin flap looks viable. The corner at the midpoint of the wound is not, however. There is some old hematoma and slough buildup. Electronic Signature(s) Signed: 02/26/2023 12:13:34 PM By: Fredirick Maudlin MD FACS Entered By: Fredirick Maudlin on 02/26/2023 12:13:34 -------------------------------------------------------------------------------- Physician Orders Details Patient Name: Date of Service: [name] [name] 02/26/2023 11:00 A M Medical Record Number: [id number] Patient Account Number: [id number] Date of Birth/Sex: Treating RN: Dec 20, 1946 (28 y.o. 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 - Dr. [name] Room 1 Return A Anesthetic (In clinic) Topical Lidocaine 4% applied to wound bed Bathing/ Shower/ Hygiene Other Bathing/Shower/Hygiene Orders/Instructions: - Please do not get LEG WRAPS on left leg wet. Sponge bathe or use a cast protector on left leg if showering/bathing. Edema Control - Lymphedema / SCD / Other Left Lower Extremity Avoid standing for long periods of time. Patient to wear own  compression stockings every day. Other Edema Control Orders/Instructions: - Use  compression stocking 15-20 mm/Hg Wound Treatment Wound #4 - Lower Leg Wound Laterality: Left, Medial Jewelia, Moros Karole H ([id number]) [phone].pdf Page 4 of 10 Cleanser: Soap and Water 1 x Per Week/30 Days Discharge Instructions: May shower and wash wound with dial antibacterial soap and water prior to dressing change. Cleanser: Wound Cleanser 1 x Per Week/30 Days Discharge Instructions: Cleanse the wound with wound cleanser prior to applying a clean dressing using gauze sponges, not tissue or cotton balls. Prim Dressing: Sorbalgon AG Dressing, 4x4 (in/in) 1 x Per Week/30 Days ary Discharge Instructions: Apply to wound bed as instructed Secondary Dressing: Woven Gauze Sponge, Non-Sterile 4x4 in 1 x Per Week/30 Days Discharge Instructions: Apply over primary dressing as directed. Secured With: Transpore Surgical Tape, 2x10 (in/yd) 1 x Per Week/30 Days Discharge Instructions: Secure dressing with tape as directed. Compression Wrap: Kerlix Roll 4.5x3.1 (in/yd) 1 x Per Week/30 Days Discharge Instructions: Apply Kerlix and Coban compression as directed. Compression Wrap: Coban Self-Adherent Wrap 4x5 (in/yd) 1 x Per Week/30 Days Discharge Instructions: Apply over Kerlix as directed. Electronic Signature(s) Signed: 02/26/2023 12:33:57 PM By: Fredirick Maudlin MD FACS Entered By: Fredirick Maudlin on 02/26/2023 12:13:47 -------------------------------------------------------------------------------- Problem List Details Patient Name: Date of Service: [name] [name] 02/26/2023 11:00 A M Medical Record Number: [id number] Patient Account Number: [id number] Date of Birth/Sex: Treating RN: [birth year] (77 y.o. 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 09/20/2022 No Yes Inactive Problems Resolved Problems ICD-10 Code Description Active Date Resolved Date SHAYERA, DENKER ([id number]) [id number] Page 5 of 10 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 02/26/2023 11:00 A M Medical Record Number: [id number] Patient Account Number: [id number] Date of Birth/Sex: Treating RN: 1946-02-16 (77 y.o. F) Primary Care Provider: Marcy Panning Other Clinician: Referring Provider: Treating Provider/Extender: Lacie Draft, ELA INE Weeks in Treatment: 22 Subjective Chief Complaint Information obtained from Patient Patient seen for complaints of Non-Healing Wounds. History of Present Illness (HPI) ADMISSION 09/20/2022 This is a 77 year old woman with a past medical history significant for congestive heart failure, atrial  fibrillation, rheumatoid arthritis on long-term steroid treatment, CMML currently receiving chemotherapy and moderate malnutrition. She presents to clinic today with a wound on the dorsum of her right foot. She says it started out as a bruise and then developed a bump which subsequently broke open. She says she was sent to our clinic by her oncologist. She is not diabetic and does not smoke. ABI in clinic today was 1.55. She has had formal segmental arterial Dopplers done, a little over a year ago, which did  not show any evidence of occlusive vascular disease. On the dorsal aspect of the right foot, there is a wound with a lot of old hematoma, slough, eschar, and and nonviable subcutaneous tissue. T the best of my o ability to discern, it does appear to involve the muscle layer, but there is no evidence of necrosis. The wound is pouring serous fluid; the patient has 3+ pitting edema to the bilateral lower extremities. 09/29/2022: The patient came to clinic today with nothing but a T overlying her wound. She is accompanied by her husband today. She has not elfa communicated with her cardiologist regarding her fluid overload. She still has 3+ pitting edema bilaterally. The wound is still pouring serous fluid, although not quite as effusively as at her initial visit. There is still quite a bit of nonviable tissue and hematoma present. 10/13; patient's wound on the right lateral lower leg his eschared over and may well be on its way to healing. The real problem here is on the right dorsal foot just proximal to her toes. The patient is still puzzled by the cause of this. Looking at a picture on the patient's smart phone there is a suggestion that this might have been a hematoma which seems to fit what Dr. [name] had felt on her initial evaluation. They have been using iodoform packing kerlix and Coban. 10/20; right lower leg is healed. We are approved for snap VAC on the right foot. We will try to arrange  home health but until then she is going to need a nurse visit on Tuesday 10/20/2022: It has been 2 weeks since I have seen the wound. It has improved significantly since then. She is currently in a snap VAC and I think this has made quite a difference. The overall wound measurements are smaller. There is no massive drainage, as there had been previously. There is some good granulation tissue forming underneath a bit of slough. 10/30/2022: The wound continues to improve with use of the snap VAC. The swelling in her foot is better. The undermining continues to close in. There is a layer of slough overlying good granulation tissue. 11/06/2022: The wound has contracted further. The undermining is still present but the wound surface is better and her edema control is good. 11/13/2022: The wound is smaller again and the undermining has decreased as well. There is slough accumulation over a nice bed of granulation tissue. 11/20/2022: We did not have any snap VAC devices in clinic last week so the wound was just packed. It did well and is smaller with less undermining today. There is still slough present on the surface. 11/29/2022: The wound has contracted considerably and the undermining has resolved. There is still some slough on the wound surface. 12/13; slough on the surface of the wound required debridement however afterwards the granulation looks healthy the wound has been contracting. She has been using a snap VAC with collagen 12/13/2022: The wound was too small and superficial last week and the snap VAC was discontinued. They have been using Hydrofera Blue due to the hypertrophic granulation tissue. The wound has a little bit of slough on the surface. 12/28/2022: The wound on her dorsal foot continues to contract and is quite superficial at this point. There is a little bit of slough on the surface. Unfortunately, she struck her right medial leg on a hard surface on Christmas, resulting in a  laceration. It was sutured in the emergency room. She just came from having the sutures removed. Overall,  I do not think the skin is likely to survive, but for now there is no grossly open ulcer at this site. ESSYNCE, OZIER ([id number]) [id number] Page 6 of 10 01/03/2023: Her edema is fairly significant today and not at all well-controlled. The dorsal foot wound appears deeper, but this may be an illusion secondary to the swelling. There is a fair amount of slough accumulation in the wound. The medial leg wound, as expected, shows signs of skin necrosis and partial dehiscence. There is hematoma and necrotic fat visible. 01/10/2023: She removed her wrap about 4 days ago; despite this, both wounds are smaller and cleaner. There is still slough accumulation in both sites. 01/18/2023: She came in with her wrap on today but once it was removed, her leg began to visibly swell; it was quite an interesting phenomenon to observe. I suspect that she has poor oncotic pressure due to her low albumin. As result, her edema control is suboptimal. Both wounds are smaller with just a little bit of slough present. 01/26/2023: Both wounds are considerably smaller today. Light slough on both surfaces. Small amount of hypertrophic granulation tissue on the dorsal foot wound. 02/02/2023: Both wounds continue to contract. There is some slough and eschar on the medial leg and a little bit of slough on the dorsal foot. Edema control is much better this week. 02/09/2023: The dorsal foot wound is nearly closed. The medial leg wound is substantially smaller. There is slough on the dorsal foot and slough and eschar on the medial leg. Edema control remains good. 02/16/2023: Both the right dorsal foot wound and right medial leg wound have healed. Unfortunately, she has a new laceration on her left medial lower leg with the same etiology as the now healed right medial leg wound; part of her walker cut her  leg. There is a flap of skin that is partially adherent and may or may not survive. The fat layer is exposed but the wound is clean. 02/26/2023: Most of the skin flap looks viable. The corner at the midpoint of the wound is not, however. There is some old hematoma and slough buildup. Patient History Information obtained from Patient. Family History Unknown History, Diabetes - Father, Heart Disease - Father, No family history of Cancer. Social History Former smoker - quit 2003, Marital Status - Married, Alcohol Use - Daily, Drug Use - Prior History, Caffeine Use - Daily. Medical History Eyes Denies history of Cataracts, Glaucoma, Optic Neuritis Ear/Nose/Mouth/Throat Denies history of Chronic sinus problems/congestion, Middle ear problems Respiratory Patient has history of Asthma, Chronic Obstructive Pulmonary Disease (COPD) Cardiovascular Patient has history of Congestive Heart Failure, Hypertension Denies history of Coronary Artery Disease, Deep Vein Thrombosis, Hypotension, Myocardial Infarction Endocrine Denies history of Type I Diabetes, Type II Diabetes Immunological Denies history of Lupus Erythematosus, Raynaudoos, Scleroderma Integumentary (Skin) Denies history of History of Burn Musculoskeletal Patient has history of Rheumatoid Arthritis Denies history of Gout Neurologic Patient has history of Neuropathy Oncologic Patient has history of Received Chemotherapy - currently receiving Hospitalization/Surgery History - reverse shoulder arthroplasty Rt- 2018. Medical A Surgical History Notes nd Cardiovascular AFIB Genitourinary Stage 3 CKD Objective Constitutional no acute distress. Vitals Time Taken: 11:01 AM, Height: 64 in, Weight: 111 lbs, BMI: 19.1, Temperature: 98.5 F, Pulse: 73 bpm, Respiratory Rate: 18 breaths/min, Blood Pressure: 127/78 mmHg. Respiratory Normal work of breathing on room air. SAKAYE, TEVES ([id number]) [id number]  Page 7 of 10 General Notes: 02/26/2023: Most of the skin flap looks viable. The corner at the  midpoint of the wound is not, however. There is some old hematoma and slough buildup. Integumentary (Hair, Skin) Wound #4 status is Open. Original cause of wound was Skin T ear/Laceration. The date acquired was: 02/15/2023. The wound has been in treatment 1 weeks. The wound is located on the Left,Medial Lower Leg. The wound measures 2cm length x 0.4cm width x 0.1cm depth; 0.628cm^2 area and 0.063cm^3 volume. There is Fat Layer (Subcutaneous Tissue) exposed. There is no tunneling or undermining noted. There is small (1-33%) red granulation within the wound bed. There is a large (67-100%) amount of necrotic tissue within the wound bed including Eschar and Adherent Slough. The periwound skin appearance had no abnormalities noted for moisture. The periwound skin appearance had no abnormalities noted for color. The periwound skin appearance exhibited: Scarring. Periwound temperature was noted as No Abnormality. 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 There was a Selective/Open Wound Non-Viable Tissue Debridement ear with a total area of 0.8 sq cm performed by Fredirick Maudlin, MD. With the following instrument(s): Curette to remove Non-Viable tissue/material. Material removed includes [facility] and Other: Hematoma and skin after achieving pain control using Lidocaine 5% topical ointment. No specimens were taken. A time out was conducted at 11:22, prior to the start of the procedure. A Minimum amount of bleeding was  controlled with Pressure. The procedure was tolerated well. The patient had a pain level of 0 following the procedure. Post Debridement Measurements: 2cm length x 0.4cm width x 0.1cm depth; 0.063cm^3 volume. Character of Wound/Ulcer Post Debridement is improved. Post procedure Diagnosis Wound #4: Same as Pre-Procedure General Notes: Scribed for Dr. [name] by J.Scotton. Plan Follow-up Appointments: Return Appointment in 1 week. - Dr. [name] Room 1 Anesthetic: (In clinic) Topical Lidocaine 4% applied to wound bed Bathing/ Shower/ Hygiene: Other Bathing/Shower/Hygiene Orders/Instructions: - Please do not get LEG WRAPS on left leg wet. Sponge bathe or use a cast protector on left leg if showering/bathing. Edema Control - Lymphedema / SCD / Other: Avoid standing for long periods of time. Patient to wear own compression stockings every day. Other Edema Control Orders/Instructions: - Use compression stocking 15-20 mm/Hg WOUND #4: - Lower Leg Wound Laterality: Left, Medial Cleanser: Soap and Water 1 x Per Week/30 Days Discharge Instructions: May shower and wash wound with dial antibacterial soap and water prior to dressing change. Cleanser: Wound Cleanser 1 x Per Week/30 Days Discharge Instructions: Cleanse the wound with wound cleanser prior to applying a clean dressing using gauze sponges, not tissue or cotton balls. Prim Dressing: Sorbalgon AG Dressing, 4x4 (in/in) 1 x Per Week/30 Days ary Discharge Instructions: Apply to wound bed as instructed Secondary Dressing: Woven Gauze Sponge, Non-Sterile 4x4 in 1 x Per Week/30 Days Discharge Instructions: Apply over primary dressing as directed. Secured With: Transpore Surgical T ape, 2x10 (in/yd) 1 x Per Week/30 Days Discharge Instructions: Secure dressing with tape as directed. Com pression Wrap: Kerlix Roll 4.5x3.1 (in/yd) 1 x Per Week/30 Days Discharge Instructions: Apply Kerlix and Coban compression as directed. Com pression Wrap: Coban  Self-Adherent Wrap 4x5 (in/yd) 1 x Per Week/30 Days Discharge Instructions: Apply over Kerlix as directed. 02/26/2023: Most of the skin flap looks viable. The corner at  the midpoint of the wound is not, however. There is some old hematoma and slough buildup. I used scissors and forceps to cut away the dead skin. I then used a curette to debride hematoma and slough from the wound. We will continue silver alginate with Kerlix and Coban wrap. Follow-up in 1 week. DENECE, SPEZIA ([id number]) [id number] Page 8 of 10 Electronic Signature(s) Signed: 02/26/2023 12:14:19 PM By: Fredirick Maudlin MD FACS Entered By: Fredirick Maudlin on 02/26/2023 12:14:19 -------------------------------------------------------------------------------- HxROS Details Patient Name: Date of Service: [name] [name] 02/26/2023 11:00 A M Medical Record Number: [id number] Patient Account Number: [id number] Date of Birth/Sex: Treating RN: 05-28-1946 (77 y.o. F) Primary Care Provider: Marcy Panning Other Clinician: Referring Provider: Treating Provider/Extender: Lacie Draft, ELA INE Weeks in Treatment: 22 Information Obtained From Patient Eyes Medical History: Negative for: Cataracts; Glaucoma; Optic Neuritis Ear/Nose/Mouth/Throat Medical History: Negative for: Chronic sinus problems/congestion; Middle ear problems Respiratory Medical History: Positive for: Asthma; Chronic Obstructive Pulmonary Disease (COPD) Cardiovascular Medical History: Positive for: Congestive Heart Failure; Hypertension Negative for: Coronary Artery Disease; Deep Vein Thrombosis; Hypotension; Myocardial Infarction Past Medical History Notes: AFIB Endocrine Medical History: Negative for: Type I Diabetes; Type II Diabetes Genitourinary Medical History: Past Medical History Notes: Stage 3 CKD Immunological Medical History: Negative for: Lupus Erythematosus; Raynauds;  Scleroderma Integumentary (Skin) Medical History: Negative for: History of Burn Musculoskeletal Medical History: Positive for: Rheumatoid Arthritis Negative for: Gout Neurologic Medical History: Positive for: Neuropathy Oncologic Medical History: Positive for: Received Chemotherapy - currently receiving CHANDY, VANSCOYK ([id number]) [phone] Page 9 of 10 Immunizations Pneumococcal Vaccine: Received Pneumococcal Vaccination: Yes Received Pneumococcal Vaccination On or After 60th Birthday: Yes Implantable Devices None Hospitalization / Surgery History Type of Hospitalization/Surgery reverse shoulder arthroplasty Rt- 2018 Family and Social History Unknown History: Yes; Cancer: No; Diabetes: Yes - Father; Heart Disease: Yes - Father; Former smoker - quit 2003; Marital Status - Married; Alcohol Use: Daily; Drug Use: Prior History; Caffeine Use: Daily; Financial Concerns: No; Food, Clothing or Shelter Needs: No; Support System Lacking: No; Transportation Concerns: No Electronic Signature(s) Signed: 02/26/2023 12:33:57 PM By: Fredirick Maudlin MD FACS Entered By: Fredirick Maudlin on 02/26/2023 12:12:41 -------------------------------------------------------------------------------- SuperBill Details Patient Name: Date of Service: [name] RO Ovidio Hanger. 02/26/2023 Medical Record Number: [id number] Patient Account Number: [id number] Date of Birth/Sex: Treating RN: 11/24/1946 (77 y.o. 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 Facility Procedures : CPT4 Code: [id number] Description: [id number] - DEBRIDE WOUND 1ST 20 SQ CM OR < ICD-10 Diagnosis Description L97.822 Non-pressure chronic ulcer of other part of left lower leg with fat layer expose Modifier: d Quantity: 1 Physician Procedures : CPT4 Code Description Modifier [id number] - WC PHYS LEVEL 3 - EST PT 25 ICD-10 Diagnosis Description L97.822 Non-pressure chronic ulcer of other part of left lower leg with fat layer exposed [id number] Chronic systolic (congestive) heart failure Z79.52  Long term (current) use of systemic steroids 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

## 2023-03-01 NOTE — Progress Notes (Signed)
DNYLAH, MATHES ([id number]) [id number] Page 1 of 6 Visit Report for 02/26/2023 Arrival Information Details Patient Name: Date of Service: [name], [name] 02/26/2023 11:00 A M Medical Record Number: [id number] Patient Account Number: [id number] Date of Birth/Sex: Treating RN: Aug 26, 1946 (77 y.o. [name], [name] Primary Care [name]: [name] INE Other Clinician: Referring [name]: Treating [name]/Extender: [name], ELA INE Weeks in Treatment: 22 Visit Information History Since Last Visit Added or deleted any medications: No Patient Arrived: Wheel Chair Any new allergies or adverse reactions: No Arrival Time: 10:56 Had a fall or experienced change in No Accompanied By: spouse activities of daily living that may affect Transfer Assistance: None risk of falls: Patient Identification Verified: Yes Signs or symptoms of abuse/neglect since last visito No Secondary Verification Process Completed: Yes Hospitalized since last visit: No Patient Requires Transmission-Based Precautions: No Implantable device outside of the clinic excluding No Patient Has Alerts: No cellular tissue based products placed in the center since last visit: Has Dressing in Place as Prescribed: Yes Has Compression in Place as Prescribed: Yes Pain Present Now: No Electronic Signature(s) Signed: 02/27/2023 5:11:49 PM By: Baruch Gouty RN, BSN Entered By: Baruch Gouty on 02/26/2023 10:59:50 -------------------------------------------------------------------------------- Encounter Discharge Information Details Patient Name: Date of Service: Sherry Masson [name] 02/26/2023 11:00 A M Medical Record Number: [id number] Patient Account Number: [id number] Date of Birth/Sex: Treating RN: 1946/08/18 (77 y.o. [name] Primary Care Evamaria Detore: Marcy Panning Other Clinician: Referring Stori Royse: Treating [name]: [name], ELA  INE Weeks in Treatment: 22 Encounter Discharge Information Items Post Procedure Vitals Discharge Condition: Stable Temperature (F): 98.5 Ambulatory Status: Wheelchair Pulse (bpm): 73 Discharge Destination: Home Respiratory Rate (breaths/min): 18 Transportation: Private Auto Blood Pressure (mmHg): 127/78 Accompanied By: spouse Schedule Follow-up Appointment: Yes Clinical Summary of Care: Patient Declined Electronic Signature(s) Signed: 02/26/2023 6:24:12 PM By: Dellie Catholic RN Entered By: Dellie Catholic on 02/26/2023 18:01:55 -------------------------------------------------------------------------------- Lower Extremity Assessment Details Patient Name: Date of Service: [name], Sherry [name] 02/26/2023 11:00 A M Medical Record Number: [id number] Patient Account Number: [id number] Date of Birth/Sex: Treating RN: March 06, 1946 (77 y.o. [name] Primary Care Deltha Bernales: Marcy Panning Other Clinician: Referring Aadhira Heffernan: Treating Tanzie Rothschild/Extender: [name], ELA INE Weeks in Treatment: 22 Edema Assessment S[LeftCHANEQUA, [name] [id number] Patrice [id number].pdf Page 2 of 6] Assessed: [Left: No] [Right: No] Edema: [Left: Ye] [Right: s] Calf Left: Right: Point of Measurement: From Medial Instep 29 cm Ankle Left: Right: Point of Measurement: From Medial Instep 21 cm Vascular Assessment Pulses: Dorsalis Pedis Palpable: [Left:Yes] Electronic Signature(s) Signed: 02/27/2023 5:11:49 PM By: Baruch Gouty RN, BSN Entered By: Baruch Gouty on 02/26/2023 11:07:41 -------------------------------------------------------------------------------- Multi Wound Chart Details Patient Name: Date of Service: Sherry Masson [name] 02/26/2023 11:00 A M Medical Record Number: [id number] Patient Account Number: [id number] Date of Birth/Sex: Treating RN: 1946/05/31 (77 y.o. F) Primary Care Jevan Gaunt: [name] INE Other Clinician: Referring  Stephone Gum: Treating Gurshaan Matsuoka/Extender: [name], ELA INE Weeks in Treatment: 22 Vital Signs Height(in): 64 Pulse(bpm): 65 Weight(lbs): 111 Blood Pressure(mmHg): 127/78 Body Mass Index(BMI): 19.1 Temperature(F): 98.5 Respiratory Rate(breaths/min): 18 [4:Photos:] [N/A:N/A] Left, Medial Lower Leg N/A N/A Wound Location: Skin Tear/Laceration N/A N/A Wounding Event: Skin Tear N/A N/A Primary Etiology: Asthma, Chronic Obstructive N/A N/A Comorbid History: Pulmonary Disease (COPD), Congestive Heart Failure, Hypertension, Rheumatoid Arthritis, Neuropathy, Received Chemotherapy 02/15/2023 N/A N/A Date Acquired: 1 N/A N/A Weeks of Treatment: Open N/A N/A Wound Status: No N/A N/A Wound Recurrence: 2x0.4x0.1  N/A N/A Measurements L x W x D (cm) 0.628 N/A N/A A (cm) : rea 0.063 N/A N/A Volume (cm) : 60.00% N/A N/A % Reduction in A rea: 59.90% N/A N/A % Reduction in Volume: Partial Thickness N/A N/A Classification: Small (1-33%) N/A N/A Granulation A mount: Red N/A N/A Granulation Quality: Large (67-100%) N/A N/A Necrotic A mount: Eschar, Adherent Slough N/A N/A Necrotic Tissue: Fat Layer (Subcutaneous Tissue): Yes N/A N/A Exposed Structures: Fascia: No [name], [name] ([id number] Page 3 of 6 Tendon: No Muscle: No Joint: No Bone: No None N/A N/A Epithelialization: Debridement - Selective/Open Wound N/A N/A Debridement: Pre-procedure Verification/Time Out 11:22 N/A N/A Taken: Lidocaine 5% topical ointment N/A N/A Pain Control: Other, Slough N/A N/A Tissue Debrided: Non-Viable Tissue N/A N/A Level: 0.8 N/A N/A Debridement A (sq cm): rea Curette N/A N/A Instrument: Minimum N/A N/A Bleeding: Pressure N/A N/A Hemostasis A chieved: 0 N/A N/A Post Procedural Pain: Procedure was tolerated well N/A N/A Debridement Treatment Response: 2x0.4x0.1 N/A N/A Post Debridement Measurements L x W x D  (cm) 0.063 N/A N/A Post Debridement Volume: (cm) Scarring: Yes N/A N/A Periwound Skin Texture: No Abnormalities Noted N/A N/A Periwound Skin Moisture: No Abnormalities Noted N/A N/A Periwound Skin Color: No Abnormality N/A N/A Temperature: Debridement N/A N/A Procedures Performed: Treatment Notes Electronic Signature(s) Signed: 02/26/2023 12:11:45 PM By: Fredirick Maudlin MD FACS Entered By: Fredirick Maudlin on 02/26/2023 12:11:45 -------------------------------------------------------------------------------- Multi-Disciplinary Care Plan Details Patient Name: Date of Service: Sherry Masson [name] 02/26/2023 11:00 A M Medical Record Number: [id number] Patient Account Number: [id number] Date of Birth/Sex: Treating RN: 19-Nov-1946 (77 y.o. [name] Primary Care Rainna Nearhood: Marcy Panning Other Clinician: Referring Americo Vallery: Treating Benny Henrie/Extender: [name], ELA INE Weeks in Treatment: 22 Active Inactive Peripheral Neuropathy Nursing Diagnoses: Potential alteration in peripheral tissue perfusion (select prior to confirmation of diagnosis) Goals: Patient/caregiver will verbalize understanding of disease process and disease management Date Initiated: 09/20/2022 Target Resolution Date: 03/25/2023 Goal Status: Active Interventions: Provide education on Management of Neuropathy and Related Ulcers Treatment Activities: Patient referred for customized footwear/offloading : 09/20/2022 Notes: Wound/Skin Impairment Nursing Diagnoses: Knowledge deficit related to ulceration/compromised skin integrity Goals: Ulcer/skin breakdown will have a volume reduction of 30% by week 4 Date Initiated: 09/20/2022 Date Inactivated: 11/29/2022 Target Resolution Date: 11/30/2022 Goal Status: Met Ulcer/skin breakdown will have a volume reduction of 50% by week 8 Date Initiated: 11/29/2022 Target Resolution Date: 03/25/2023 Goal Status: Active [name], [name] ([id number])  [phone].pdf Page 4 of 6 Interventions: Assess ulceration(s) every visit Treatment Activities: Skin care regimen initiated : 09/20/2022 Topical wound management initiated : 09/20/2022 Notes: Electronic Signature(s) Signed: 02/26/2023 6:24:12 PM By: Dellie Catholic RN Entered By: Dellie Catholic on 02/26/2023 18:00:44 -------------------------------------------------------------------------------- Pain Assessment Details Patient Name: Date of Service: Sherry Masson [name] 02/26/2023 11:00 A M Medical Record Number: [id number] Patient Account Number: [id number] Date of Birth/Sex: Treating RN: [birth year] (77 y.o. [name] Primary Care Brandace Cargle: Marcy Panning Other Clinician: Referring Denaisha Swango: Treating Brenlynn [name]/Extender: [name], ELA INE Weeks in Treatment: 22 Active Problems Location of Pain Severity and Description of Pain Patient Has Paino No Site Locations Rate the pain. Current Pain Level: 0 Pain Management and Medication Current Pain Management: Electronic Signature(s) Signed: 02/27/2023 5:11:49 PM By: Baruch Gouty RN, BSN Entered By: Baruch Gouty on 02/26/2023 11:02:25 -------------------------------------------------------------------------------- Patient/Caregiver Education Details Patient Name: Date of Service: [name] 3/4/2024andnbsp11:00 A M Medical Record Number: [id number] Patient Account Number: [id number] Date of  Birth/Gender: Treating RN: 1946/02/22 (77 y.o. [name] Primary Care Physician: [name] INE Other Clinician: Referring Physician: Treating Physician/Extender: [name], ELA INE Weeks in Treatment: 22 Education Assessment Education Provided To: Patient [name], [name] ([id number]) [id number] Page 5 of 6 Education Topics Provided Wound/Skin Impairment: Methods: Explain/Verbal Responses: Return demonstration  correctly Electronic Signature(s) Signed: 02/26/2023 6:24:12 PM By: Dellie Catholic RN Entered By: Dellie Catholic on 02/26/2023 18:01:00 -------------------------------------------------------------------------------- Wound Assessment Details Patient Name: Date of Service: Sherry Masson [name] 02/26/2023 11:00 A M Medical Record Number: [id number] Patient Account Number: [id number] Date of Birth/Sex: Treating RN: 03-Oct-1946 (77 y.o. [name] Primary Care Taylore Hinde: [name] INE Other Clinician: Referring Lando Alcalde: Treating Taesha Goodell/Extender: [name], ELA INE Weeks in Treatment: 22 Wound Status Wound Number: 4 Primary Skin T ear Etiology: Wound Location: Left, Medial Lower Leg Wound Open Wounding Event: Skin Tear/Laceration Status: Date Acquired: 02/15/2023 Comorbid Asthma, Chronic Obstructive Pulmonary Disease (COPD), Weeks Of Treatment: 1 History: Congestive Heart Failure, Hypertension, Rheumatoid Arthritis, Clustered Wound: No Neuropathy, Received Chemotherapy Photos Wound Measurements Length: (cm) 2 Width: (cm) 0.4 Depth: (cm) 0.1 Area: (cm) 0.628 Volume: (cm) 0.063 % Reduction in Area: 60% % Reduction in Volume: 59.9% Epithelialization: None Tunneling: No Undermining: No Wound Description Classification: Partial Thickness Foul Odor After Cleansing: No Slough/Fibrino Yes Wound Bed Granulation Amount: Small (1-33%) Exposed Structure Granulation Quality: Red Fascia Exposed: No Necrotic Amount: Large (67-100%) Fat Layer (Subcutaneous Tissue) Exposed: Yes Necrotic Quality: Eschar, Adherent Slough Tendon Exposed: No Muscle Exposed: No Joint Exposed: No Bone Exposed: No Periwound Skin Texture Texture Color No Abnormalities Noted: No No Abnormalities Noted: Yes Scarring: Yes Temperature / Pain Temperature: No Abnormality Moisture No Abnormalities NotedTYSHELL, [name] ([id number] Page 6  of 6 Treatment Notes Wound #4 (Lower Leg) Wound Laterality: Left, Medial Cleanser Soap and Water Discharge Instruction: May shower and wash wound with dial antibacterial soap and water prior to dressing change. Wound Cleanser Discharge Instruction: Cleanse the wound with wound cleanser prior to applying a clean dressing using gauze sponges, not tissue or cotton balls. Peri-Wound Care Topical Primary Dressing Sorbalgon AG Dressing, 4x4 (in/in) Discharge Instruction: Apply to wound bed as instructed Secondary Dressing Woven Gauze Sponge, Non-Sterile 4x4 in Discharge Instruction: Apply over primary dressing as directed. Secured With Transpore Surgical Tape, 2x10 (in/yd) Discharge Instruction: Secure dressing with tape as directed. Compression Wrap Kerlix Roll 4.5x3.1 (in/yd) Discharge Instruction: Apply Kerlix and Coban compression as directed. Coban Self-Adherent Wrap 4x5 (in/yd) Discharge Instruction: Apply over Kerlix as directed. Compression Stockings Add-Ons Electronic Signature(s) Signed: 02/26/2023 6:24:12 PM By: Dellie Catholic RN Entered By: Dellie Catholic on 02/26/2023 11:10:12 -------------------------------------------------------------------------------- Vitals Details Patient Name: Date of Service: Sherry Masson [name] 02/26/2023 11:00 A M Medical Record Number: [id number] Patient Account Number: [id number] Date of Birth/Sex: Treating RN: [birth year] (77 y.o. [name] Primary Care Raaga Maeder: [name] INE Other Clinician: Referring Aseneth Hack: Treating Jozelynn Danielson/Extender: [name], ELA INE Weeks in Treatment: 22 Vital Signs Time Taken: 11:01 Temperature (F): 98.5 Height (in): 64 Pulse (bpm): 73 Weight (lbs): 111 Respiratory Rate (breaths/min): 18 Body Mass Index (BMI): 19.1 Blood Pressure (mmHg): 127/78 Reference Range: 80 - 120 mg / dl Electronic Signature(s) Signed: 02/27/2023 5:11:49 PM By: Baruch Gouty RN, BSN Entered By:  Baruch Gouty on 02/26/2023 11:02:17

## 2023-03-04 NOTE — Assessment & Plan Note (Signed)
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  -She began azacitadine on 02/06/22. she has had multiple hospital admissions and her performance status has been very low.  Changed her chemo to every 6 weeks in 08/2022 due to her fatigue. -She is clinically stable, fatigue overall slightly improved, no other new complaints. -Lab reviewed, adequate for treatment, will proceed cycle 6 Vidaza -Continue to monitor her blood counts closely -Follow-up in 6 weeks for cycle 7 Vidaza

## 2023-03-05 ENCOUNTER — Inpatient Hospital Stay: Payer: Medicare Other

## 2023-03-05 ENCOUNTER — Inpatient Hospital Stay: Payer: Medicare Other | Attending: Nurse Practitioner

## 2023-03-05 ENCOUNTER — Encounter (HOSPITAL_BASED_OUTPATIENT_CLINIC_OR_DEPARTMENT_OTHER): Payer: Medicare Other | Admitting: General Surgery

## 2023-03-05 ENCOUNTER — Encounter: Payer: Self-pay | Admitting: Hematology

## 2023-03-05 ENCOUNTER — Inpatient Hospital Stay (HOSPITAL_BASED_OUTPATIENT_CLINIC_OR_DEPARTMENT_OTHER): Payer: Medicare Other | Admitting: Hematology

## 2023-03-05 ENCOUNTER — Inpatient Hospital Stay: Payer: Medicare Other | Admitting: Hematology

## 2023-03-05 VITALS — BP 112/70 | HR 82 | Temp 98.1°F | Resp 18 | Ht 62.0 in | Wt 107.6 lb

## 2023-03-05 DIAGNOSIS — C9310 Chronic myelomonocytic leukemia not having achieved remission: Secondary | ICD-10-CM

## 2023-03-05 DIAGNOSIS — G629 Polyneuropathy, unspecified: Secondary | ICD-10-CM | POA: Diagnosis not present

## 2023-03-05 DIAGNOSIS — J449 Chronic obstructive pulmonary disease, unspecified: Secondary | ICD-10-CM | POA: Diagnosis not present

## 2023-03-05 DIAGNOSIS — Z95828 Presence of other vascular implants and grafts: Secondary | ICD-10-CM

## 2023-03-05 DIAGNOSIS — S81812A Laceration without foreign body, left lower leg, initial encounter: Secondary | ICD-10-CM | POA: Diagnosis not present

## 2023-03-05 DIAGNOSIS — I130 Hypertensive heart and chronic kidney disease with heart failure and stage 1 through stage 4 chronic kidney disease, or unspecified chronic kidney disease: Secondary | ICD-10-CM | POA: Diagnosis not present

## 2023-03-05 DIAGNOSIS — L97822 Non-pressure chronic ulcer of other part of left lower leg with fat layer exposed: Secondary | ICD-10-CM | POA: Diagnosis not present

## 2023-03-05 DIAGNOSIS — Z5111 Encounter for antineoplastic chemotherapy: Secondary | ICD-10-CM | POA: Diagnosis not present

## 2023-03-05 DIAGNOSIS — N183 Chronic kidney disease, stage 3 unspecified: Secondary | ICD-10-CM | POA: Diagnosis not present

## 2023-03-05 DIAGNOSIS — I5022 Chronic systolic (congestive) heart failure: Secondary | ICD-10-CM | POA: Diagnosis not present

## 2023-03-05 LAB — CBC WITH DIFFERENTIAL (CANCER CENTER ONLY)
Abs Immature Granulocytes: 0.07 10*3/uL (ref 0.00–0.07)
Basophils Absolute: 0.1 10*3/uL (ref 0.0–0.1)
Basophils Relative: 1 %
Eosinophils Absolute: 0.2 10*3/uL (ref 0.0–0.5)
Eosinophils Relative: 2 %
HCT: 34.4 % — ABNORMAL LOW (ref 36.0–46.0)
Hemoglobin: 11.3 g/dL — ABNORMAL LOW (ref 12.0–15.0)
Immature Granulocytes: 1 %
Lymphocytes Relative: 16 %
Lymphs Abs: 1.5 10*3/uL (ref 0.7–4.0)
MCH: 34.5 pg — ABNORMAL HIGH (ref 26.0–34.0)
MCHC: 32.8 g/dL (ref 30.0–36.0)
MCV: 104.9 fL — ABNORMAL HIGH (ref 80.0–100.0)
Monocytes Absolute: 1.6 10*3/uL — ABNORMAL HIGH (ref 0.1–1.0)
Monocytes Relative: 17 %
Neutro Abs: 6 10*3/uL (ref 1.7–7.7)
Neutrophils Relative %: 63 %
Platelet Count: 171 10*3/uL (ref 150–400)
RBC: 3.28 MIL/uL — ABNORMAL LOW (ref 3.87–5.11)
RDW: 14.6 % (ref 11.5–15.5)
WBC Count: 9.5 10*3/uL (ref 4.0–10.5)
nRBC: 0 % (ref 0.0–0.2)

## 2023-03-05 LAB — CMP (CANCER CENTER ONLY)
ALT: 17 U/L (ref 0–44)
AST: 22 U/L (ref 15–41)
Albumin: 4 g/dL (ref 3.5–5.0)
Alkaline Phosphatase: 46 U/L (ref 38–126)
Anion gap: 6 (ref 5–15)
BUN: 15 mg/dL (ref 8–23)
CO2: 29 mmol/L (ref 22–32)
Calcium: 9.2 mg/dL (ref 8.9–10.3)
Chloride: 102 mmol/L (ref 98–111)
Creatinine: 0.84 mg/dL (ref 0.44–1.00)
GFR, Estimated: >60 mL/min (ref >60)
Glucose, Bld: 130 mg/dL — ABNORMAL HIGH (ref 70–99)
Potassium: 4.3 mmol/L (ref 3.5–5.1)
Sodium: 137 mmol/L (ref 135–145)
Total Bilirubin: 0.6 mg/dL (ref 0.3–1.2)
Total Protein: 6.2 g/dL — ABNORMAL LOW (ref 6.5–8.1)

## 2023-03-05 MED ORDER — SODIUM CHLORIDE 0.9% FLUSH
10.0000 mL | INTRAVENOUS | Status: DC | PRN
  Administered 2023-03-05: 10 mL

## 2023-03-05 MED ORDER — HEPARIN SOD (PORK) LOCK FLUSH 100 UNIT/ML IV SOLN
500.0000 [IU] | Freq: Once | INTRAVENOUS | Status: AC | PRN
  Administered 2023-03-05: 500 [IU]

## 2023-03-05 MED ORDER — SODIUM CHLORIDE 0.9% FLUSH
10.0000 mL | Freq: Once | INTRAVENOUS | Status: AC
  Administered 2023-03-05: 10 mL

## 2023-03-05 MED ORDER — SODIUM CHLORIDE 0.9 % IV SOLN
75.0000 mg/m2 | Freq: Once | INTRAVENOUS | Status: AC
  Administered 2023-03-05: 114 mg via INTRAVENOUS
  Filled 2023-03-05: qty 11.4

## 2023-03-05 MED ORDER — SODIUM CHLORIDE 0.9 % IV SOLN: Freq: Once | INTRAVENOUS | Status: AC

## 2023-03-05 MED ORDER — ONDANSETRON HCL 4 MG/2ML IJ SOLN
8.0000 mg | Freq: Once | INTRAMUSCULAR | Status: AC
  Administered 2023-03-05: 8 mg via INTRAVENOUS
  Filled 2023-03-05: qty 4

## 2023-03-05 NOTE — Patient Instructions (Signed)
[facility]  Discharge Instructions: Thank you for choosing [facility] to provide your oncology and hematology care.   If you have a lab appointment with the Cancer Center, please go directly to the Cancer Center and check in at the registration area.   Wear comfortable clothing and clothing appropriate for easy access to any Portacath or PICC line.   We strive to give you quality time with your provider. You may need to reschedule your appointment if you arrive late (15 or more minutes).  Arriving late affects you and other patients whose appointments are after yours.  Also, if you miss three or more appointments without notifying the office, you may be dismissed from the clinic at the provider's discretion.      For prescription refill requests, have your pharmacy contact our office and allow 72 hours for refills to be completed.    Today you received the following chemotherapy and/or immunotherapy agents: Vidaza     To help prevent nausea and vomiting after your treatment, we encourage you to take your nausea medication as directed.  BELOW ARE SYMPTOMS THAT SHOULD BE REPORTED IMMEDIATELY: *FEVER GREATER THAN 100.4 F (38 C) OR HIGHER *CHILLS OR SWEATING *NAUSEA AND VOMITING THAT IS NOT CONTROLLED WITH YOUR NAUSEA MEDICATION *UNUSUAL SHORTNESS OF BREATH *UNUSUAL BRUISING OR BLEEDING *URINARY PROBLEMS (pain or burning when urinating, or frequent urination) *BOWEL PROBLEMS (unusual diarrhea, constipation, pain near the anus) TENDERNESS IN MOUTH AND THROAT WITH OR WITHOUT PRESENCE OF ULCERS (sore throat, sores in mouth, or a toothache) UNUSUAL RASH, SWELLING OR PAIN  UNUSUAL VAGINAL DISCHARGE OR ITCHING   Items with * indicate a potential emergency and should be followed up as soon as possible or go to the Emergency Department if any problems should occur.  Please show the CHEMOTHERAPY ALERT CARD or IMMUNOTHERAPY ALERT CARD at check-in  to the Emergency Department and triage nurse.  Should you have questions after your visit or need to cancel or reschedule your appointment, please contact [facility]  Dept: [phone]  and follow the prompts.  Office hours are 8:00 a.m. to 4:30 p.m. Monday - Friday. Please note that voicemails left after 4:00 p.m. may not be returned until the following business day.  We are closed weekends and major holidays. You have access to a nurse at all times for urgent questions. Please call the main number to the clinic Dept: [phone] and follow the prompts.   For any non-urgent questions, you may also contact your provider using MyChart. We now offer e-Visits for anyone 18 and older to request care online for non-urgent symptoms. For details visit [email or web address].   Also download the MyChart app! Go to the app store, search "MyChart", open the app, select [facility], and log in with your MyChart username and password.  

## 2023-03-05 NOTE — Progress Notes (Signed)
Nutrition Follow-up:  Patient with CMML.  Receiving azacitidine days 1-7 q 28 days.   Met with patient during infusion.  Reports that she is eating mostly 2 meals a day (breakfast and early supper).  Ate bagel with cream cheese and egg drop soup this am for breakfast.  Ate shrimp cocktail for dinner last night.  Drinks a boost glucose control shake daily but does not really like them.    Medications: reviewed  Labs: reviewed  Anthropometrics:   Weight 107 lb 9.6 oz today  104 lb 3.2 oz on 12/18   NUTRITION DIAGNOSIS: Inadequate oral intake continues   INTERVENTION:  Continue high calorie, high protein foods to promote weight gain Continue boost shake for added calories    MONITORING, EVALUATION, GOAL: weight trends, intake   NEXT VISIT: as needed with treatment  Ethridge Sollenberger B. Zenia Resides, [place], [place] Registered Dietitian [phone]

## 2023-03-05 NOTE — Progress Notes (Signed)
[facility]   Telephone:([phone] Fax:([phone]) [phone]   Clinic Follow up Note   Patient Care Team: [name], MD as PCP - General (Family Medicine) [name], Wonda Cheng, MD as PCP - Cardiology (Cardiology) Gavin Pound, MD as Consulting Physician (Rheumatology) Truitt Merle, MD as Consulting Physician (Hematology) Juanita Craver, MD as Consulting Physician (Gastroenterology) Garner Nash, DO as Consulting Physician (Pulmonary Disease)  Date of Service:  03/05/2023  CHIEF COMPLAINT: f/u of CMML    CURRENT THERAPY:  ASSESSMENT:  [name] is a 77 y.o. 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 Her BM biopsy was reviewed at [facility] and was felt to be CMML-1 with 5% blasts.  -She began azacitadine on 02/06/22. she has had multiple hospital admissions and her performance status has been very low.  Changed her chemo to every 6 weeks in 08/2022 due to her fatigue. -she is tolerating every 6 weeks much better, feels better and able to do all her ADL now -she saw Dr. Linus Orn in Feb 2024, who recommends to continue current therapy  -Lab reviewed, adequate for treatment, will proceed cycle 6 Vidaza -Continue to monitor her blood counts closely -Follow-up in 6 weeks for cycle 7 Vidaza  2. LE wound  -healing, f/u with wound clinic   3.  A-fib with RVR; CHF -hospitalized three times in 2023 for CHF exacerbation -s/p thoracentesis for PE 03/30/22 - Heart rate controlled with high dose metoprolol, digoxin and diltiazem   4. COPD, AF, HTN -on resp meds and eliquis; continue per PCP,  cardiology, and pulmonology  -Due to thrombocytopenia, Eliquis has been on hold -Continue PCP, cardiology, and pulmonology follow-up   5. Rheumatoid arthritis -on Remicade q8 weeks per rheumatologist Dr. Trudie Reed.  -She takes vitamin supplements and gets prolia injection q44month with her PCP.   PLAN: - HGB 11.3 no infusion needed - reviewed labs - can continue with treatment today C6 Vidaza and daily for 4 more days  -lab, follow up and chemo in 6 weeks, lab/flush in 3 weeks    SUMMARY OF ONCOLOGIC HISTORY: Oncology History  CMML (ch[place]ocytic [place]) [place]  11/09/2021 Initial Biopsy   DIAGNOSIS:   -  Monoclonal B-cell population with co-expression of CD5 comprises 17%  of all lymphocytes  -  See comment   COMMENT:  In addition to the clonal B-cell population, there is a myeloblast  population (CD34, CD38, HLA-DR, CD117, CD123 and CD33) that comprises 2% of the total cellular events.  Please see concurrent tissue biopsy (below) for additional work-up and final diagnosis.    FINAL MICROSCOPIC DIAGNOSIS:   A. SOFT TISSUE MASS, PRE SACRAL, NEEDLE CORE BIOPSY:  -  Chronic lymphocytic leukemia/small lymphocytic lymphoma  -  Extra medullary hematopoiesis  -  See comment   COMMENT:  The biopsy consists of multiple soft tissue cores with lymphoid nodules and a dense hematopoietic infiltrate consistent with extra medullary hematopoiesis.  MPO and E-cadherin highlight myeloid and erythroid precursors respectively.  CD34 highlights increased vasculature and is also positive within the cytoplasm of megakaryocytes.  A few small, immature mononuclear cells appear to be positive for CD34 and CD117. TdT shows rare, scattered positive  cells.  CD20 highlights aggregates of B cells which are admixed with CD3 positive T cells.  T cells are an admixture of CD4 and CD8.  The B cells are also positive for CD5, CD23 and Bcl-2.  The B cells do not show significant staining for CD10, BCL6 or  cyclin D1.  CD138 highlights scattered plasma cells which are polytypic by kappa and lambda in situ hybridization.  Flow cytometry performed on the sample (see WL [id number]) identified a kappa restricted CD5 positive B-cell population comprising 70% of lymphocytes.  In addition, a small myeloblast population comprised 2% of the total cellular events.   Overall, the findings are consistent with soft tissue involvement by  chronic lymphocytic leukemia/small lymphocytic lymphoma and extra medullary hematopoiesis. In reviewing the patient's CBC data (macrocytic anemia and thrombocytopenia), I would recommend a bone marrow biopsy to assess for marrow involvement by CLL/SLL.    12/23/2021 Imaging   EXAM: CT CHEST, ABDOMEN, AND PELVIS WITH CONTRAST  IMPRESSION: 1. Slight interval enlargement of a presacral soft tissue mass measuring 7.2 x 4.7 cm, previously 6.9 x 4.1 cm on prior MR of the pelvis dated 07/04/2021. By report, this represents a biopsy proven lymphoma. 2. Pleural nodule of the dependent right lower lobe overlying the posterior right tenth rib and pleural or paraspinous soft tissue mass overlying the right aspect of the T10 vertebral body, very slightly enlarged compared to prior examination of the chest dated 06/25/2020, consistent with additional sites of lymphomatous involvement given very indolent growth. These could be better assessed for metabolic activity by FDG PET/CT if desired. 3. There is mild, bibasilar predominant pulmonary fibrosis in a pattern featuring irregular peripheral interstitial opacity, septal thickening, but without clear evidence of subpleural bronchiolectasis or honeycombing, with a somewhat asymmetric distribution most conspicuously involving the right lower lobe and lingula. These findings are significantly worsened when compared to prior examination dated 06/25/2020, particularly in the right lower lobe. Given interval change, this may reflect sequelae of  interval infection or aspiration, however appearance is generally suspicious for fibrotic interstitial lung disease, and if characterized by ATS pulmonary fibrosis is in an "indeterminate for UIP" pattern, differential considerations including both UIP and NSIP. 4. Emphysema.   Aortic Atherosclerosis (ICD10-I70.0) and Emphysema (ICD10-J43.9).   01/05/2022 Pathology Results   DIAGNOSIS:   BONE MARROW, ASPIRATE, CLOT, CORE:  -Hypercellular bone marrow for age with features of  myelodysplastic/myeloproliferative neoplasm  -Minor abnormal B-cell population  -See comment   PERIPHERAL BLOOD:  -Macrocytic anemia  -Neutrophilic left shift and monocytosis  -Thrombocytopenia   COMMENT:  The bone marrow is hypercellular for age with dyspoietic changes  involving myeloid cell lines associated with monocytosis and increased number of blastic cells (12%) as primarily seen by morphology, many of which display monocytic features.  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  In this background, there are several predominantly small lymphoid aggregates mostly composed of small lymphoid cells.  By flow cytometry, a minor abnormal B-cell population expressing CD5 is seen and representing 2% of all cells.  This correlate with previously known B-cell lymphoproliferative process.  Correlation with cytogenetic and FISH studies is strongly recommended.    DIAGNOSIS:   -Increased number of monocytic cells present (25%)  -Minor abnormal B-cell population identified.  -See comment   COMMENT:  Flow cytometric analysis shows increased number of monocytic cells representing 25% of all cells but without aberrant phenotype or  CD34 expression.  A significant CD34-positive blastic population is not identified.  The lymphoid population shows a  minor B-cell population representing 2% of all cells and expressing B-cell antigens including CD20 associated with CD5, CD200 and possibly dim kappa expression.  The latter findings are abnormal and correlate with previously known B-cell lymphoproliferative process.  No significant T-cell phenotypic abnormalities identified.    01/12/2022 Initial Diagnosis   CMML (chronic myelomonocytic leukemia) ([place])   01/12/2022 Cancer Staging   Staging form: Chronic Myeloid Leukemia, AJCC 8th Edition - Clinical stage from 01/12/2022: Bone marrow blast count (%): 12, Additional clonal changes: Unknown - Signed by Truitt Merle, MD on 01/12/2022 Stage prefix: Initial diagnosis   02/06/2022 - 08/11/2022 Chemotherapy   Patient is on Treatment Plan : MYELODYSPLASIA  Azacitidine IV D1-7 q28d     02/06/2022 -  Chemotherapy   Patient is on Treatment Plan : MYELODYSPLASIA  Azacitidine IV D1-7 q28d        INTERVAL HISTORY:  [name] is here for a follow up of CMML.   She was last seen by Dr. Truitt Merle on 01/22/2023. She presents to the clinic with family member. Labs have improved and patient has gained weight since her last visit. Cancer is stable and she can be seen every six weeks. Back pain has improved but she can't stand straight up due to the tumor. Have to use walker at all times to walk around.  Some blood seen in bowel movements, may be due to hemroids.    All other systems were reviewed with the patient and are negative.  MEDICAL HISTORY:  Past Medical History:  Diagnosis Date   Arthritis    Rheumatoid arthritis   Celiac disease    Chronic kidney disease    stage 3 from MD notes   COPD (chronic obstructive pulmonary disease) ([place])    Dyspnea    with going up stairs   Family history of adverse reaction to anesthesia    father had hard time waking up   Headache    sinus headaches   Hot flashes    Hypertension    Iron deficiency anemia    Pneumonia    per patient "I have walking pneumonia"     SURGICAL HISTORY: Past Surgical History:  Procedure Laterality Date   COLONOSCOPY     ECTOPIC PREGNANCY SURGERY      x 2   IR IMAGING GUIDED PORT INSERTION  01/31/2022   REVERSE SHOULDER ARTHROPLASTY Right 02/02/2017   Procedure: RIGHT REVERSE SHOULDER ARTHROPLASTY;  Surgeon: Netta Cedars, MD;  Location: [facility];  Service: Orthopedics;  Laterality: Right;    I have reviewed the social history and family history with the patient and they are unchanged from previous note.  ALLERGIES:  is allergic to digoxin and related, gluten meal, penicillins, alendronate, and hydroxychloroquine.  MEDICATIONS:  Current Outpatient Medications  Medication Sig Dispense Refill   denosumab (PROLIA) 60 MG/ML SOSY injection Inject 60 mg into the skin every 6 (six) months.     diltiazem (CARDIZEM CD) 240 MG 24 hr capsule Take 1 capsule (240 mg total) by mouth daily. 30 capsule 6   furosemide (LASIX) 20 MG tablet Take 1 tablet (20 mg total) by mouth daily. 30 tablet 11   gabapentin (NEURONTIN) 300 MG capsule Take 300 mg by mouth in the morning.     Metoprolol Tartrate 75 MG TABS Take 150 mg by mouth 2 (two) times daily. 360 tablet 3   potassium chloride SA (KLOR-CON  M20) 20 MEQ tablet Take 1 tablet (20 mEq total) by mouth daily. Take with furosemide 90 tablet 3   prochlorperazine (COMPAZINE) 10 MG tablet Take 1 tablet (10 mg total) by mouth every 6 (six) hours as needed for nausea or vomiting. 30 tablet 2   STIOLTO RESPIMAT 2.5-2.5 MCG/ACT AERS INHALE 2 PUFFS INTO THE LUNGS DAILY 4 g 5   No current facility-administered medications for this visit.   Facility-Administered Medications Ordered in Other Visits  Medication Dose Route Frequency Provider Last Rate Last Admin   acetaminophen (TYLENOL) 325 MG tablet            diphenhydrAMINE (BENADRYL) 25 mg capsule             PHYSICAL EXAMINATION: ECOG PERFORMANCE STATUS: 3 - Symptomatic, >50% confined to bed  Vitals:   03/05/23 1220  BP: 112/70  Pulse: 82   Resp: 18  Temp: 98.1 F (36.7 C)  SpO2: 100%   Wt Readings from Last 3 Encounters:  03/05/23 107 lb 9.6 oz (48.8 kg)  01/22/23 103 lb 6 oz (46.9 kg)  01/03/23 106 lb 3.2 oz (48.2 kg)    GENERAL:alert, no distress and comfortable SKIN: skin color, texture, turgor are normal, no rashes or significant lesions EYES: normal, Conjunctiva are pink and non-injected, sclera clear NECK: supple, thyroid normal size, non-tender, without nodularity LYMPH:  no palpable lymphadenopathy in the cervical, axillary  LUNGS: (-)clear to auscultation and percussion with normal breathing effort HEART: (-)regular rate & rhythm and no murmurs and no lower extremity edema ABDOMEN:abdomen soft, non-tender and normal bowel sounds Musculoskeletal:no cyanosis of digits and no clubbing  NEURO: alert & oriented x 3 with fluent speech, no focal motor/sensory deficits  LABORATORY DATA:  I have reviewed the data as listed    Latest Ref Rng & Units 03/05/2023   11:44 AM 02/20/2023    2:07 PM 02/06/2023    1:55 PM  CBC  WBC 4.0 - 10.5 K/uL 9.5  6.4  4.8   Hemoglobin 12.0 - 15.0 g/dL 11.3  10.7  10.1   Hematocrit 36.0 - 46.0 % 34.4  31.9  30.6   Platelets 150 - 400 K/uL 171  98  67         Latest Ref Rng & Units 03/05/2023   11:44 AM 02/20/2023    2:07 PM 02/06/2023    1:55 PM  CMP  Glucose 70 - 99 mg/dL 130  101  113   BUN 8 - 23 mg/dL '15  14  16   '$ Creatinine 0.44 - 1.00 mg/dL 0.84  0.89  0.86   Sodium 135 - 145 mmol/L 137  136  138   Potassium 3.5 - 5.1 mmol/L 4.3  4.0  4.6   Chloride 98 - 111 mmol/L 102  100  102   CO2 22 - 32 mmol/L '29  29  28   '$ Calcium 8.9 - 10.3 mg/dL 9.2  9.1  9.1   Total Protein 6.5 - 8.1 g/dL 6.2  6.3  6.2   Total Bilirubin 0.3 - 1.2 mg/dL 0.6  0.7  0.7   Alkaline Phos 38 - 126 U/L 46  47  57   AST 15 - 41 U/L '22  22  25   '$ ALT 0 - 44 U/L '17  18  25       '$ RADIOGRAPHIC STUDIES: I have personally reviewed the radiological images as listed and agreed with the findings in the  report. No results found.  Orders Placed This Encounter  Procedures   CBC with Differential ([place] Only)    Standing Status:   Future    Standing Expiration Date:   123456   Basic Metabolic Panel - [place] Only    Standing Status:   Future    Standing Expiration Date:   04/15/2024   All questions were answered. The patient knows to call the clinic with any problems, questions or concerns. No barriers to learning was detected. The total time spent in the appointment was 30 minutes.     Truitt Merle, MD 03/05/2023   I, Maurine Simmering, CMA, am acting as scribe for Truitt Merle, MD.   I have reviewed the above documentation for accuracy and completeness, and I agree with the above.

## 2023-03-06 ENCOUNTER — Ambulatory Visit: Payer: Medicare Other | Admitting: Cardiovascular Disease

## 2023-03-06 ENCOUNTER — Inpatient Hospital Stay: Payer: Medicare Other

## 2023-03-06 VITALS — BP 123/54 | HR 64 | Temp 97.7°F | Resp 18

## 2023-03-06 DIAGNOSIS — C9310 Chronic myelomonocytic leukemia not having achieved remission: Secondary | ICD-10-CM | POA: Diagnosis not present

## 2023-03-06 DIAGNOSIS — Z5111 Encounter for antineoplastic chemotherapy: Secondary | ICD-10-CM | POA: Diagnosis not present

## 2023-03-06 MED ORDER — SODIUM CHLORIDE 0.9% FLUSH
10.0000 mL | INTRAVENOUS | Status: DC | PRN
  Administered 2023-03-06: 10 mL

## 2023-03-06 MED ORDER — HEPARIN SOD (PORK) LOCK FLUSH 100 UNIT/ML IV SOLN
500.0000 [IU] | Freq: Once | INTRAVENOUS | Status: AC | PRN
  Administered 2023-03-06: 500 [IU]

## 2023-03-06 MED ORDER — ONDANSETRON HCL 4 MG/2ML IJ SOLN
8.0000 mg | Freq: Once | INTRAMUSCULAR | Status: AC
  Administered 2023-03-06: 8 mg via INTRAVENOUS
  Filled 2023-03-06: qty 4

## 2023-03-06 MED ORDER — SODIUM CHLORIDE 0.9 % IV SOLN: Freq: Once | INTRAVENOUS | Status: AC

## 2023-03-06 MED ORDER — SODIUM CHLORIDE 0.9 % IV SOLN
75.0000 mg/m2 | Freq: Once | INTRAVENOUS | Status: AC
  Administered 2023-03-06: 114 mg via INTRAVENOUS
  Filled 2023-03-06: qty 11.4

## 2023-03-06 NOTE — Progress Notes (Signed)
TAMICHA, TOWSLEY ([id number]) [id number] Page 1 of 10 Visit Report for 03/05/2023 Chief Complaint Document Details Patient Name: Date of Service: [name], [name] 03/05/2023 8:15 A M Medical Record Number: [id number] Patient Account Number: [id number] Date of Birth/Sex: Treating RN: 08-09-1946 (77 y.o. F) Primary Care Provider: Marcy [name] Other Clinician: Referring Provider: Treating Provider/Extender: [name], [name] in Treatment: 23 Information Obtained from: Patient Chief Complaint Patient seen for complaints of Non-Healing Wounds. Electronic Signature(s) Signed: 03/05/2023 9:22:00 AM By: Sherry Maudlin MD FACS Entered By: [name] on 03/05/2023 09:22:00 -------------------------------------------------------------------------------- Debridement Details Patient Name: Date of Service: [name] RO N [name]. 03/05/2023 8:15 A M Medical Record Number: [id number] Patient Account Number: [id number] Date of Birth/Sex: Treating RN: 10/14/1946 (77 y.o. [name], [name] Primary Care Provider: Judie [name] INE Other Clinician: Referring Provider: Treating Provider/Extender: [name], [name] in Treatment: 23 Debridement Performed for Assessment: Wound #4 Left,Medial Lower Leg Performed By: Physician Sherry Maudlin, MD Debridement Type: Debridement Level of Consciousness (Pre-procedure): Awake and Alert Pre-procedure Verification/Time Out Yes - 08:55 Taken: Start Time: 08:55 Pain Control: Lidocaine 4% T opical Solution T Area Debrided (L x W): otal 1 (cm) x 0.3 (cm) = 0.3 (cm) Tissue and other material debrided: Non-Viable, Slough, Slough Level: Non-Viable Tissue Debridement Description: Selective/Open Wound Instrument: Curette Bleeding: Minimum Hemostasis Achieved: Pressure Procedural Pain: 0 Post Procedural Pain: 0 Response to Treatment: Procedure was tolerated well Level of  Consciousness (Post- Awake and Alert procedure): Post Debridement Measurements of Total Wound Length: (cm) 1 Width: (cm) 0.3 Depth: (cm) 0.1 Volume: (cm) 0.024 Character of Wound/Ulcer Post Debridement: Improved Post Procedure Diagnosis Same as Pre-procedure Notes Scribed for Dr. Celine [name] by Sherry Gouty, RN Electronic Signature(s) Signed: 03/05/2023 5:44:44 PM By: Sherry Gouty RN, BSN Signed: 03/06/2023 7:40:18 AM By: Sherry Maudlin MD FACS Entered By: [name] on 03/05/2023 08:56:30 [name], [name] ([id number] Page 2 of 10 -------------------------------------------------------------------------------- HPI Details Patient Name: Date of Service: Sherry, [name] 03/05/2023 8:15 A M Medical Record Number: [id number] Patient Account Number: [id number] Date of Birth/Sex: Treating RN: July 24, 1946 (77 y.o. F) Primary Care Provider: Marcy [name] Other Clinician: Referring Provider: Treating Provider/Extender: [name], [name] in Treatment: 23 History of Present Illness HPI Description: ADMISSION 09/20/2022 This is a 77 year old woman with a past medical history significant for congestive heart failure, atrial fibrillation, rheumatoid arthritis on long-term steroid treatment, CMML currently receiving chemotherapy and moderate malnutrition. She presents to clinic today with a wound on the dorsum of her right foot. She says it started out as a bruise and then developed a bump which subsequently broke open. She says she was sent to our clinic by her oncologist. She is not diabetic and does not smoke. ABI in clinic today was 1.55. She has had formal segmental arterial Dopplers done, a little over a year ago, which did not show any evidence of occlusive vascular disease. On the dorsal aspect of the right foot, there is a wound with a lot of old hematoma, slough, eschar, and and nonviable subcutaneous tissue. T  the best of my o ability to discern, it does appear to involve the muscle layer, but there is no evidence of necrosis. The wound is pouring serous fluid; the patient has 3+ pitting edema to the bilateral lower extremities. 09/29/2022: The patient came to clinic today with nothing but a T overlying her wound. She is accompanied by her husband today. She has not  elfa communicated with her cardiologist regarding her fluid overload. She still has 3+ pitting edema bilaterally. The wound is still pouring serous fluid, although not quite as effusively as at her initial visit. There is still quite a bit of nonviable tissue and hematoma present. 10/13; patient's wound on the right lateral lower leg his eschared over and may well be on its way to healing. The real problem here is on the right dorsal foot just proximal to her toes. The patient is still puzzled by the cause of this. Looking at a picture on the patient's smart phone there is a suggestion that this might have been a hematoma which seems to fit what Dr. Celine [name] had felt on her initial evaluation. They have been using iodoform packing kerlix and Coban. 10/20; right lower leg is healed. We are approved for snap VAC on the right foot. We will try to arrange home health but until then she is going to need a nurse visit on Tuesday 10/20/2022: It has been 2 [name] since I have seen the wound. It has improved significantly since then. She is currently in a snap VAC and I think this has made quite a difference. The overall wound measurements are smaller. There is no massive drainage, as there had been previously. There is some good granulation tissue forming underneath a bit of slough. 10/30/2022: The wound continues to improve with use of the snap VAC. The swelling in her foot is better. The undermining continues to close in. There is a layer of slough overlying good granulation tissue. 11/06/2022: The wound has contracted further. The undermining is still  present but the wound surface is better and her edema control is good. 11/13/2022: The wound is smaller again and the undermining has decreased as well. There is slough accumulation over a nice bed of granulation tissue. 11/20/2022: We did not have any snap VAC devices in clinic last week so the wound was just packed. It did well and is smaller with less undermining today. There is still slough present on the surface. 11/29/2022: The wound has contracted considerably and the undermining has resolved. There is still some slough on the wound surface. 12/13; slough on the surface of the wound required debridement however afterwards the granulation looks healthy the wound has been contracting. She has been using a snap VAC with collagen 12/13/2022: The wound was too small and superficial last week and the snap VAC was discontinued. They have been using Hydrofera Blue due to the hypertrophic granulation tissue. The wound has a little bit of slough on the surface. 12/28/2022: The wound on her dorsal foot continues to contract and is quite superficial at this point. There is a little bit of slough on the surface. Unfortunately, she struck her right medial leg on a hard surface on Christmas, resulting in a laceration. It was sutured in the emergency room. She just came from having the sutures removed. Overall, I do not think the skin is likely to survive, but for now there is no grossly open ulcer at this site. 01/03/2023: Her edema is fairly significant today and not at all well-controlled. The dorsal foot wound appears deeper, but this may be an illusion secondary to the swelling. There is a fair amount of slough accumulation in the wound. The medial leg wound, as expected, shows signs of skin necrosis and partial dehiscence. There is hematoma and necrotic fat visible. 01/10/2023: She removed her wrap about 4 days ago; despite this, both wounds are smaller and cleaner. There  is still slough accumulation in both  sites. 01/18/2023: She came in with her wrap on today but once it was removed, her leg began to visibly swell; it was quite an interesting phenomenon to observe. I suspect that she has poor oncotic pressure due to her low albumin. As result, her edema control is suboptimal. Both wounds are smaller with just a little bit of slough present. 01/26/2023: Both wounds are considerably smaller today. Light slough on both surfaces. Small amount of hypertrophic granulation tissue on the dorsal foot wound. 02/02/2023: Both wounds continue to contract. There is some slough and eschar on the medial leg and a little bit of slough on the dorsal foot. Edema control is much better this week. 02/09/2023: The dorsal foot wound is nearly closed. The medial leg wound is substantially smaller. There is slough on the dorsal foot and slough and eschar on the medial leg. Edema control remains good. 02/16/2023: Both the right dorsal foot wound and right medial leg wound have healed. Unfortunately, she has a new laceration on her left medial lower leg with the same etiology as the now healed right medial leg wound; part of her walker cut her leg. There is a flap of skin that is partially adherent and may or may not survive. The fat layer is exposed but the wound is clean. 02/26/2023: Most of the skin flap looks viable. The corner at the midpoint of the wound is not, however. There is some old hematoma and slough buildup. [name], [name] ([id number]) [id number] Page 3 of 10 03/05/2023: Fortunately, the skin flap survived. There is just a small open area of the wound with some light slough accumulation. Electronic Signature(s) Signed: 03/05/2023 9:22:40 AM By: Sherry Maudlin MD FACS Entered By: [name] on 03/05/2023 09:22:39 -------------------------------------------------------------------------------- Physical Exam Details Patient Name: Date of Service: [name] RO N [name]. 03/05/2023 8:15  A M Medical Record Number: [id number] Patient Account Number: [id number] Date of Birth/Sex: Treating RN: 12/09/1946 (77 y.o. F) Primary Care Provider: Marcy [name] Other Clinician: Referring Provider: Treating Provider/Extender: [name], [name] in Treatment: 23 Constitutional .Tachycardic, asymptomatic. . . no acute distress. Respiratory Normal work of breathing on room air. Notes 03/05/2023: Fortunately, the skin flap survived. There is just a small open area of the wound with some light slough accumulation. Electronic Signature(s) Signed: 03/05/2023 9:32:25 AM By: Sherry Maudlin MD FACS Previous Signature: 03/05/2023 9:23:50 AM Version By: Sherry Maudlin MD FACS Entered By: [name] on 03/05/2023 09:32:24 -------------------------------------------------------------------------------- Physician Orders Details Patient Name: Date of Service: [name] RO N [name]. 03/05/2023 8:15 A M Medical Record Number: [id number] Patient Account Number: [id number] Date of Birth/Sex: Treating RN: 04-Feb-1946 (77 y.o. 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 - Dr. Celine [name] RM 1 Return A Tuesday 3/26 @ 10:30 am Nurse Visit: - 1 week RM 1 Monday 3/18 @ 11:15 am Anesthetic (In clinic) Topical Lidocaine 4% applied to wound bed Bathing/ Shower/ Hygiene May shower with  protection but do not get wound dressing(s) wet. Protect dressing(s) with water  repellant cover (for example, large plastic bag) or a cast cover and may then take shower. Edema Control - Lymphedema / SCD / Other Left Lower Extremity Elevate legs to the level of the heart or above for 30 minutes daily and/or when sitting for 3-4 times a day throughout the day. SHRADHA, FERRARE ([id number]) [id number] Page 4 of 10 Avoid standing for long periods of time. Patient to wear own compression stockings every day. Exercise regularly Other Edema Control Orders/Instructions: - Use compression stocking 15-20 mm/Hg right leg daily Wound Treatment Wound #4 - Lower Leg Wound Laterality: Left, Medial Cleanser: Soap and Water 1 x Per Week/30 Days Discharge Instructions: May shower and wash wound with dial antibacterial soap and water prior to dressing change. Cleanser: Wound Cleanser 1 x Per Week/30 Days Discharge Instructions: Cleanse the wound with wound cleanser prior to applying a clean dressing using gauze sponges, not tissue or cotton balls. Prim Dressing: Sorbalgon [employer] Dressing, 4x4 (in/in) 1 x Per Week/30 Days ary Discharge Instructions: Apply to wound bed as instructed Secondary Dressing: Woven Gauze Sponge, Non-Sterile 4x4 in 1 x Per Week/30 Days Discharge Instructions: Apply over primary dressing as directed. Secured With: Transpore Surgical Tape, 2x10 (in/yd) 1 x Per Week/30 Days Discharge Instructions: Secure dressing with tape as directed. Compression Wrap: Kerlix Roll 4.5x3.1 (in/yd) 1 x Per Week/30 Days Discharge Instructions: Apply Kerlix and Coban compression as directed. Compression Wrap: Coban Self-Adherent Wrap 4x5 (in/yd) 1 x Per Week/30 Days Discharge Instructions: Apply over Kerlix as directed. Electronic Signature(s) Signed: 03/06/2023 7:40:18 AM By: Sherry Maudlin MD FACS Entered By: [name] on 03/05/2023  09:32:43 -------------------------------------------------------------------------------- Problem List Details Patient Name: Date of Service: [name] RO N [name]. 03/05/2023 8:15 A M Medical Record Number: [id number] Patient Account Number: [id number] Date of Birth/Sex: Treating RN: [birth year] (77 y.o. [name] Primary Care Provider: Marcy [name] Other Clinician: Referring Provider: Treating Provider/Extender: [name], [name] in Treatment: 23 Active Problems ICD-10 Encounter Code Description Active Date MDM Diagnosis [phone] Non-pressure chronic ulcer of other part of left lower leg with fat layer exposed 02/16/2023 No Yes [id number] Chronic systolic (congestive) heart failure 09/20/2022 No Yes M06.9 Rheumatoid arthritis, unspecified 09/20/2022 No Yes Z79.52 Long term (current) use of systemic steroids 09/20/2022 No Yes I70.90 Unspecified atherosclerosis 09/20/2022 No Yes 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 09/20/2022 No Yes [name], [name] ([id number]) [phone].pdf Page 5 of 10 Inactive Problems Resolved Problems ICD-10 Code Description Active Date Resolved Date 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 03/05/2023 8:15 A M Medical Record Number: [id number] Patient Account Number: [id number] Date of  Birth/Sex: Treating RN: 1946-04-21 (77 y.o. F) Primary Care Provider: Marcy [name] Other Clinician: Referring Provider: Treating Provider/Extender: [name], [name] in Treatment: 23 Subjective Chief Complaint Information obtained from Patient Patient seen for complaints of Non-Healing Wounds. History of Present Illness (HPI) ADMISSION 09/20/2022 This is a 77 year old woman with a past medical history significant for congestive heart failure, atrial fibrillation, rheumatoid arthritis on long-term steroid treatment,  CMML currently receiving chemotherapy and moderate malnutrition. She presents to clinic today with a wound on the dorsum of her right foot. She says it started out as a bruise and then developed a bump which subsequently broke open. She says she was sent to our clinic by her oncologist. She is not diabetic and does not smoke. ABI in clinic today was 1.55. She has had formal segmental arterial Dopplers done, a little over a year ago, which did not show any evidence of occlusive vascular disease. On the dorsal aspect of the right foot, there is a wound with a lot of old hematoma, slough, eschar, and and nonviable subcutaneous tissue. T the best of my o ability to discern, it does appear to involve the muscle layer, but there is no evidence of necrosis. The wound is pouring serous fluid; the patient has 3+ pitting edema to the bilateral lower extremities. 09/29/2022: The patient came to clinic today with nothing but a T overlying her wound. She is accompanied by her husband today. She has not elfa communicated with her cardiologist regarding her fluid overload. She still has 3+ pitting edema bilaterally. The wound is still pouring serous fluid, although not quite as effusively as at her initial visit. There is still quite a bit of nonviable tissue and hematoma present. 10/13; patient's wound on the right lateral lower leg his eschared over and may well be on  its way to healing. The real problem here is on the right dorsal foot just proximal to her toes. The patient is still puzzled by the cause of this. Looking at a picture on the patient's smart phone there is a suggestion that this might have been a hematoma which seems to fit what Dr. Celine [name] had felt on her initial evaluation. They have been using iodoform packing kerlix and Coban. 10/20; right lower leg is healed. We are approved for snap VAC on the right foot. We will try to arrange home health but until then she is going to need a nurse visit on Tuesday 10/20/2022: It has been 2 [name] since I have seen the wound. It has improved significantly since then. She is currently in a snap VAC and I think this has made quite a difference. The overall wound measurements are smaller. There is no massive drainage, as there had been previously. There is some good granulation tissue forming underneath a bit of slough. 10/30/2022: The wound continues to improve with use of the snap VAC. The swelling in her foot is better. The undermining continues to close in. There is a layer of slough overlying good granulation tissue. 11/06/2022: The wound has contracted further. The undermining is still present but the wound surface is better and her edema control is good. 11/13/2022: The wound is smaller again and the undermining has decreased as well. There is slough accumulation over a nice bed of granulation tissue. 11/20/2022: We did not have any snap VAC devices in clinic last week so the wound was just packed. It did well and is smaller with less undermining today. There is still slough present on the surface. 11/29/2022: The wound has contracted considerably and the undermining has resolved. There is still some slough on the wound surface. 12/13; slough on the surface of the wound required debridement however afterwards the granulation looks healthy the wound has been contracting. She has been using a snap VAC with  collagen [name], [name] ([id number]) [phone].pdf Page 6 of 10 12/13/2022: The wound was too small and superficial last week  and the snap VAC was discontinued. They have been using Hydrofera Blue due to the hypertrophic granulation tissue. The wound has a little bit of slough on the surface. 12/28/2022: The wound on her dorsal foot continues to contract and is quite superficial at this point. There is a little bit of slough on the surface. Unfortunately, she struck her right medial leg on a hard surface on Christmas, resulting in a laceration. It was sutured in the emergency room. She just came from having the sutures removed. Overall, I do not think the skin is likely to survive, but for now there is no grossly open ulcer at this site. 01/03/2023: Her edema is fairly significant today and not at all well-controlled. The dorsal foot wound appears deeper, but this may be an illusion secondary to the swelling. There is a fair amount of slough accumulation in the wound. The medial leg wound, as expected, shows signs of skin necrosis and partial dehiscence. There is hematoma and necrotic fat visible. 01/10/2023: She removed her wrap about 4 days ago; despite this, both wounds are smaller and cleaner. There is still slough accumulation in both sites. 01/18/2023: She came in with her wrap on today but once it was removed, her leg began to visibly swell; it was quite an interesting phenomenon to observe. I suspect that she has poor oncotic pressure due to her low albumin. As result, her edema control is suboptimal. Both wounds are smaller with just a little bit of slough present. 01/26/2023: Both wounds are considerably smaller today. Light slough on both surfaces. Small amount of hypertrophic granulation tissue on the dorsal foot wound. 02/02/2023: Both wounds continue to contract. There is some slough and eschar on the medial leg and a little bit of slough on the dorsal foot. Edema  control is much better this week. 02/09/2023: The dorsal foot wound is nearly closed. The medial leg wound is substantially smaller. There is slough on the dorsal foot and slough and eschar on the medial leg. Edema control remains good. 02/16/2023: Both the right dorsal foot wound and right medial leg wound have healed. Unfortunately, she has a new laceration on her left medial lower leg with the same etiology as the now healed right medial leg wound; part of her walker cut her leg. There is a flap of skin that is partially adherent and may or may not survive. The fat layer is exposed but the wound is clean. 02/26/2023: Most of the skin flap looks viable. The corner at the midpoint of the wound is not, however. There is some old hematoma and slough buildup. 03/05/2023: Fortunately, the skin flap survived. There is just a small open area of the wound with some light slough accumulation. Patient History Information obtained from Patient. Family History Unknown History, Diabetes - Father, Heart Disease - Father, No family history of Cancer. Social History Former smoker - quit 2003, Marital Status - Married, Alcohol Use - Daily, Drug Use - Prior History, Caffeine Use - Daily. Medical History Eyes Denies history of Cataracts, Glaucoma, Optic Neuritis Ear/Nose/Mouth/Throat Denies history of Chronic sinus problems/congestion, Middle ear problems Respiratory Patient has history of Asthma, Chronic Obstructive Pulmonary Disease (COPD) Cardiovascular Patient has history of Congestive Heart Failure, Hypertension Denies history of Coronary Artery Disease, Deep Vein Thrombosis, Hypotension, Myocardial Infarction Endocrine Denies history of Type I Diabetes, Type II Diabetes Immunological Denies history of Lupus Erythematosus, Raynaudoos, Scleroderma Integumentary (Skin) Denies history of History of Burn Musculoskeletal Patient has history of Rheumatoid Arthritis Denies history of  Gout Neurologic Patient has history of Neuropathy Oncologic Patient has history of Received Chemotherapy - currently receiving Hospitalization/Surgery History - reverse shoulder arthroplasty Rt- 2018. Medical A Surgical History Notes nd Cardiovascular AFIB Genitourinary Stage 3 CKD Objective Constitutional [name], [name] ([id number]) [id number] Page 7 of 10 Tachycardic, asymptomatic. no acute distress. Vitals Time Taken: 8:40 AM, Height: 64 in, Weight: 111 lbs, BMI: 19.1, Temperature: 97.5 F, Pulse: 116 bpm, Respiratory Rate: 18 breaths/min, Blood Pressure: 136/75 mmHg. Respiratory Normal work of breathing on room air. General Notes: 03/05/2023: Fortunately, the skin flap survived. There is just a small open area of the wound with some light slough accumulation. Integumentary (Hair, Skin) Wound #4 status is Open. Original cause of wound was Skin T ear/Laceration. The date acquired was: 02/15/2023. The wound has been in treatment 2 [name]. The wound is located on the Left,Medial Lower Leg. The wound measures 1cm length x 0.3cm width x 0.1cm depth; 0.236cm^2 area and 0.024cm^3 volume. There is Fat Layer (Subcutaneous Tissue) exposed. There is no tunneling or undermining noted. There is a small amount of serosanguineous drainage noted. The wound margin is flat and intact. There is large (67-100%) red granulation within the wound bed. There is a small (1-33%) amount of necrotic tissue within the wound bed including Adherent Slough. The periwound skin appearance had no abnormalities noted for texture. The periwound skin appearance had no abnormalities noted for moisture. The periwound skin appearance had no abnormalities noted for color. Periwound temperature was noted as No Abnormality. The periwound has tenderness on palpation. 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 There was a Selective/Open Wound Non-Viable Tissue Debridement ear with a total area of 0.3 sq cm performed by Sherry Maudlin, MD. With the following instrument(s): Curette to remove Non-Viable tissue/material. Material removed includes [facility] after achieving pain control using Lidocaine 4% Topical Solution. No specimens were taken. A time out was conducted at 08:55, prior to the start of the procedure. A Minimum amount of bleeding was controlled with Pressure. The procedure was tolerated well with a pain level of 0 throughout and a pain level of 0 following the procedure. Post Debridement Measurements: 1cm length x 0.3cm width x 0.1cm depth; 0.024cm^3 volume. Character of Wound/Ulcer Post Debridement is improved. Post procedure Diagnosis Wound #4: Same as Pre-Procedure General Notes: Scribed for Dr. Celine [name] by Sherry Gouty, RN. Plan Follow-up Appointments: Return Appointment in 2 [name]. - Dr. Celine [name] RM 1 Tuesday 3/26 @ 10:30 am Nurse Visit: - 1 week RM 1 Monday 3/18 @ 11:15 am Anesthetic: (In clinic) Topical Lidocaine 4% applied to wound bed Bathing/ Shower/ Hygiene: May shower with protection but do not get wound dressing(s) wet. Protect dressing(s) with water repellant cover (for example, large plastic bag) or a cast cover and may then take shower. Edema Control - Lymphedema / SCD / Other: Elevate legs to the level of the heart or above for 30 minutes daily and/or when sitting for 3-4 times a day throughout the day. Avoid standing for long periods of time. Patient to wear own compression stockings every day. Exercise regularly Other Edema Control Orders/Instructions: - Use  compression stocking 15-20 mm/Hg right leg daily WOUND #4: - Lower Leg Wound  Laterality: Left, Medial Cleanser: Soap and Water 1 x Per Week/30 Days Discharge Instructions: May shower and wash wound with dial antibacterial soap and water prior to dressing change. Cleanser: Wound Cleanser 1 x Per Week/30 Days Discharge Instructions: Cleanse the wound with wound cleanser prior to applying a clean dressing using gauze sponges, not tissue or cotton balls. Prim Dressing: Sorbalgon AG Dressing, 4x4 (in/in) 1 x Per Week/30 Days ary Discharge Instructions: Apply to wound bed as instructed Secondary Dressing: Woven Gauze Sponge, Non-Sterile 4x4 in 1 x Per Week/30 Days Discharge Instructions: Apply over primary dressing as directed. Secured With: Transpore Surgical T ape, 2x10 (in/yd) 1 x Per Week/30 Days Discharge Instructions: Secure dressing with tape as directed. Com pression Wrap: Kerlix Roll 4.5x3.1 (in/yd) 1 x Per Week/30 Days Discharge Instructions: Apply Kerlix and Coban compression as directed. Com pression Wrap: Coban Self-Adherent Wrap 4x5 (in/yd) 1 x Per Week/30 Days Discharge Instructions: Apply over Kerlix as directed. [name], [name] ([id number]) [id number] Page 8 of 10 03/05/2023: Fortunately, the skin flap survived. There is just a small open area of the wound with some light slough accumulation. I used a curette to debride the slough from the wound surface. We will continue silver alginate with Kerlix and Coban wrapping. She will have a nurse visit next week to have her dressing changed and I will see her in 2 [name]. Electronic Signature(s) Signed: 03/05/2023 9:33:13 AM By: Sherry Maudlin MD FACS Entered By: [name] on 03/05/2023 09:33:13 -------------------------------------------------------------------------------- HxROS Details Patient Name: Date of Service: [name] RO N [name]. 03/05/2023 8:15 A M Medical Record Number: [id number] Patient  Account Number: [id number] Date of Birth/Sex: Treating RN: June 12, 1946 (77 y.o. F) Primary Care Provider: Marcy [name] Other Clinician: Referring Provider: Treating Provider/Extender: [name], [name] in Treatment: 23 Information Obtained From Patient Eyes Medical History: Negative for: Cataracts; Glaucoma; Optic Neuritis Ear/Nose/Mouth/Throat Medical History: Negative for: Chronic sinus problems/congestion; Middle ear problems Respiratory Medical History: Positive for: Asthma; Chronic Obstructive Pulmonary Disease (COPD) Cardiovascular Medical History: Positive for: Congestive Heart Failure; Hypertension Negative for: Coronary Artery Disease; Deep Vein Thrombosis; Hypotension; Myocardial Infarction Past Medical History Notes: AFIB Endocrine Medical History: Negative for: Type I Diabetes; Type II Diabetes Genitourinary Medical History: Past Medical History Notes: Stage 3 CKD Immunological Medical History: Negative for: Lupus Erythematosus; Raynauds; Scleroderma Integumentary (Skin) Medical History: Negative for: History of Burn Musculoskeletal Medical History: Positive for: Rheumatoid Arthritis Negative for: Gout [name], [name] ([id number]) [phone].pdf Page 9 of 10 Neurologic Medical History: Positive for: Neuropathy Oncologic Medical History: Positive for: Received Chemotherapy - currently receiving Immunizations Pneumococcal Vaccine: Received Pneumococcal Vaccination: Yes Received Pneumococcal Vaccination On or After 60th Birthday: Yes Implantable Devices None Hospitalization / Surgery History Type of Hospitalization/Surgery reverse shoulder arthroplasty Rt- 2018 Family and Social History Unknown History: Yes; Cancer: No; Diabetes: Yes - Father; Heart Disease: Yes - Father; Former smoker - quit 2003; Marital Status - Married; Alcohol Use: Daily; Drug Use: Prior History; Caffeine Use: Daily; Financial  Concerns: No; Food, Clothing or Shelter Needs: No; Support System Lacking: No; Transportation Concerns: No Electronic Signature(s) Signed: 03/06/2023 7:40:18 AM By: Sherry Maudlin MD FACS Entered By: [name] on 03/05/2023 09:22:46 -------------------------------------------------------------------------------- SuperBill Details Patient Name: Date of Service: [name] RO N [name]. 03/05/2023 Medical Record Number: [id number] Patient Account Number: [id number] Date of Birth/Sex: Treating RN: 04-19-1946 (77 y.o. 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 Facility Procedures : CPT4 Code: [id number] Description: [id number] - DEBRIDE WOUND 1ST 20 SQ CM OR < ICD-10 Diagnosis Description L97.822 Non-pressure chronic ulcer of other part of left lower leg with fat layer expose Modifier: d Quantity: 1 Physician Procedures : CPT4 Code Description Modifier [id number] - WC PHYS LEVEL 3 - EST PT 25 ICD-10 Diagnosis Description L97.822 Non-pressure chronic ulcer of other part of left lower leg with fat layer exposed [id number] Chronic systolic (congestive) heart failure Z79.52  Long term (current) use of systemic steroids [name], [name] ([id number]) [id number] 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

## 2023-03-06 NOTE — Progress Notes (Signed)
CORTNE, KOWALKE ([id number]) [id number] Page 1 of 7 Visit Report for 03/05/2023 Arrival Information Details Patient Name: Date of Service: [name], [name] 03/05/2023 8:15 A M Medical Record Number: [id number] Patient Account Number: [id number] Date of Birth/Sex: Treating RN: 1946/03/04 (77 y.o. [name], Linda Primary Care Hoorain Kozakiewicz: Judie Bonus INE Other Clinician: Referring Urie Loughner: Treating Amarissa Koerner/Extender: Lacie Draft, ELA INE Weeks in Treatment: 23 Visit Information History Since Last Visit Added or deleted any medications: No Patient Arrived: Wheel Chair Any new allergies or adverse reactions: No Arrival Time: 08:38 Had a fall or experienced change in No Accompanied By: spouse activities of daily living that may affect Transfer Assistance: None risk of falls: Patient Identification Verified: Yes Signs or symptoms of abuse/neglect since last visito No Secondary Verification Process Completed: Yes Hospitalized since last visit: No Patient Requires Transmission-Based Precautions: No Implantable device outside of the clinic excluding No Patient Has Alerts: No cellular tissue based products placed in the center since last visit: Has Dressing in Place as Prescribed: Yes Has Compression in Place as Prescribed: Yes Pain Present Now: No Electronic Signature(s) Signed: 03/05/2023 5:44:44 PM By: Baruch Gouty RN, BSN Entered By: Baruch Gouty on 03/05/2023 08:39:00 -------------------------------------------------------------------------------- Encounter Discharge Information Details Patient Name: Date of Service: Sherry Masson RO N [name]. 03/05/2023 8:15 A M Medical Record Number: [id number] Patient Account Number: [id number] Date of Birth/Sex: Treating RN: 02-Nov-1946 (77 y.o. [name] Primary Care Deanglo Hissong: Marcy Panning Other Clinician: Referring Julian Askin: Treating Caylen Yardley/Extender: Lacie Draft, ELA  INE Weeks in Treatment: 23 Encounter Discharge Information Items Post Procedure Vitals Discharge Condition: Stable Temperature (F): 97.5 Ambulatory Status: Wheelchair Pulse (bpm): 116 Discharge Destination: [name] Respiratory Rate (breaths/min): 18 Transportation: Private Auto Blood Pressure (mmHg): 136/75 Accompanied By: spouse Schedule Follow-up Appointment: Yes Clinical Summary of Care: Patient Declined Electronic Signature(s) Signed: 03/05/2023 5:44:44 PM By: Baruch Gouty RN, BSN Entered By: Baruch Gouty on 03/05/2023 09:09:30 -------------------------------------------------------------------------------- Lower Extremity Assessment Details Patient Name: Date of Service: [name], Sherry RO N [name]. 03/05/2023 8:15 A M Medical Record Number: [id number] Patient Account Number: [id number] Date of Birth/Sex: Treating RN: 1946/11/09 (77 y.o. [name] Primary Care Jennae Hakeem: Marcy Panning Other Clinician: Referring Tristian Sickinger: Treating Derron Pipkins/Extender: Lacie Draft, ELA INE Weeks in Treatment: [address] Edema Assessment S[LeftTERRIKA, SEELY [id number] Patrice [id number].pdf Page 2 of 7] Assessed: [Left: No] [Right: No] Edema: [Left: Ye] [Right: s] Calf Left: Right: Point of Measurement: From Medial Instep 26 cm Ankle Left: Right: Point of Measurement: From Medial Instep 21 cm Vascular Assessment Pulses: Dorsalis Pedis Palpable: [Left:Yes] Electronic Signature(s) Signed: 03/05/2023 5:44:44 PM By: Baruch Gouty RN, BSN Entered By: Baruch Gouty on 03/05/2023 08:45:31 -------------------------------------------------------------------------------- Multi Wound Chart Details Patient Name: Date of Service: Sherry Masson RO N [name]. 03/05/2023 8:15 A M Medical Record Number: [id number] Patient Account Number: [id number] Date of Birth/Sex: Treating RN: 07-Jun-1946 (77 y.o. F) Primary Care Mazzie Brodrick: Judie Bonus INE Other  Clinician: Referring Jannelle Notaro: Treating Stanisha Lorenz/Extender: Lacie Draft, ELA INE Weeks in Treatment: 23 Vital Signs Height(in): 64 Pulse(bpm): 116 Weight(lbs): 111 Blood Pressure(mmHg): 136/75 Body Mass Index(BMI): 19.1 Temperature(F): 97.5 Respiratory Rate(breaths/min): 18 [4:Photos:] [N/A:N/A] Left, Medial Lower Leg N/A N/A Wound Location: Skin Tear/Laceration N/A N/A Wounding Event: Skin Tear N/A N/A Primary Etiology: Asthma, Chronic Obstructive N/A N/A Comorbid History: Pulmonary Disease (COPD), Congestive Heart Failure, Hypertension, Rheumatoid Arthritis, Neuropathy, Received Chemotherapy 02/15/2023 N/A N/A Date Acquired: 2 N/A N/A Weeks of Treatment: Open N/A N/A Wound Status: No N/A N/A Wound Recurrence:  1x0.3x0.1 N/A N/A Measurements L x W x D (cm) 0.236 N/A N/A A (cm) : rea 0.024 N/A N/A Volume (cm) : 85.00% N/A N/A % Reduction in A rea: 84.70% N/A N/A % Reduction in Volume: Partial Thickness N/A N/A Classification: Small N/A N/A Exudate A mount: Serosanguineous N/A N/A Exudate Type: red, brown N/A N/A Exudate Color: Flat and Intact N/A N/A Wound Margin: Large (67-100%) N/A N/A Granulation A mount: Red N/A N/A Granulation QualityYASHICA, RESKO ([id number].pdf Page 3 of 7 Small (1-33%) N/A N/A Necrotic Amount: Fat Layer (Subcutaneous Tissue): Yes N/A N/A Exposed Structures: Fascia: No Tendon: No Muscle: No Joint: No Bone: No Medium (34-66%) N/A N/A Epithelialization: Debridement - Selective/Open Wound N/A N/A Debridement: Pre-procedure Verification/Time Out 08:55 N/A N/A Taken: Lidocaine 4% Topical Solution N/A N/A Pain Control: Slough N/A N/A Tissue Debrided: Non-Viable Tissue N/A N/A Level: 0.3 N/A N/A Debridement A (sq cm): rea Curette N/A N/A Instrument: Minimum N/A N/A Bleeding: Pressure N/A N/A Hemostasis A chieved: 0 N/A N/A Procedural Pain: 0 N/A N/A Post  Procedural Pain: Procedure was tolerated well N/A N/A Debridement Treatment Response: 1x0.3x0.1 N/A N/A Post Debridement Measurements L x W x D (cm) 0.024 N/A N/A Post Debridement Volume: (cm) Scarring: No N/A N/A Periwound Skin Texture: No Abnormalities Noted N/A N/A Periwound Skin Moisture: No Abnormalities Noted N/A N/A Periwound Skin Color: No Abnormality N/A N/A Temperature: Yes N/A N/A Tenderness on Palpation: Debridement N/A N/A Procedures Performed: Treatment Notes Wound #4 (Lower Leg) Wound Laterality: Left, Medial Cleanser Soap and Water Discharge Instruction: May shower and wash wound with dial antibacterial soap and water prior to dressing change. Wound Cleanser Discharge Instruction: Cleanse the wound with wound cleanser prior to applying a clean dressing using gauze sponges, not tissue or cotton balls. Peri-Wound Care Topical Primary Dressing Sorbalgon AG Dressing, 4x4 (in/in) Discharge Instruction: Apply to wound bed as instructed Secondary Dressing Woven Gauze Sponge, Non-Sterile 4x4 in Discharge Instruction: Apply over primary dressing as directed. Secured With Transpore Surgical Tape, 2x10 (in/yd) Discharge Instruction: Secure dressing with tape as directed. Compression Wrap Kerlix Roll 4.5x3.1 (in/yd) Discharge Instruction: Apply Kerlix and Coban compression as directed. Coban Self-Adherent Wrap 4x5 (in/yd) Discharge Instruction: Apply over Kerlix as directed. Compression Stockings Add-Ons Electronic Signature(s) Signed: 03/05/2023 9:21:45 AM By: Fredirick Maudlin MD FACS Entered By: Fredirick Maudlin on 03/05/2023 09:21:45 -------------------------------------------------------------------------------- Multi-Disciplinary Care Plan Details Patient Name: Date of Service: [name], Sherry RO N [name]. 03/05/2023 8:15 A Anne Ng, [name] ([id number].pdf Page 4 of 7 Medical Record Number: [id number] Patient Account Number:  [id number] Date of Birth/Sex: Treating RN: 1946-11-10 (77 y.o. [name] Primary Care Shaleta Ruacho: Marcy Panning Other Clinician: Referring Nitara Szczerba: Treating Arrabella Westerman/Extender: Lacie Draft, ELA INE Weeks in Treatment: 23 Active Inactive Peripheral Neuropathy Nursing Diagnoses: Potential alteration in peripheral tissue perfusion (select prior to confirmation of diagnosis) Goals: Patient/caregiver will verbalize understanding of disease process and disease management Date Initiated: 09/20/2022 Target Resolution Date: 03/25/2023 Goal Status: Active Interventions: Provide education on Management of Neuropathy and Related Ulcers Treatment Activities: Patient referred for customized footwear/offloading : 09/20/2022 Notes: Wound/Skin Impairment Nursing Diagnoses: Knowledge deficit related to ulceration/compromised skin integrity Goals: Ulcer/skin breakdown will have a volume reduction of 30% by week 4 Date Initiated: 09/20/2022 Date Inactivated: 11/29/2022 Target Resolution Date: 11/30/2022 Goal Status: Met Ulcer/skin breakdown will have a volume reduction of 50% by week 8 Date Initiated: 11/29/2022 Target Resolution Date: 03/25/2023 Goal Status: Active Interventions: Assess ulceration(s) every visit Treatment Activities: Skin care regimen  initiated : 09/20/2022 Topical wound management initiated : 09/20/2022 Notes: Electronic Signature(s) Signed: 03/05/2023 5:44:44 PM By: Baruch Gouty RN, BSN Entered By: Baruch Gouty on 03/05/2023 08:50:45 -------------------------------------------------------------------------------- Pain Assessment Details Patient Name: Date of Service: Sherry Masson RO N [name]. 03/05/2023 8:15 A M Medical Record Number: [id number] Patient Account Number: [id number] Date of Birth/Sex: Treating RN: 1946-07-29 (77 y.o. [name] Primary Care Rilynn Habel: Marcy Panning Other Clinician: Referring Seyon Strader: Treating Ansel Ferrall/Extender:  Lacie Draft, ELA INE Weeks in Treatment: 23 Active Problems Location of Pain Severity and Description of Pain Patient Has Paino No Site Locations Rate the pain. [name], [name] ([id number]) [id number] Page 5 of 7 Rate the pain. Current Pain Level: 0 Pain Management and Medication Current Pain Management: Electronic Signature(s) Signed: 03/05/2023 5:44:44 PM By: Baruch Gouty RN, BSN Entered By: Baruch Gouty on 03/05/2023 08:45:07 -------------------------------------------------------------------------------- Patient/Caregiver Education Details Patient Name: Date of Service: [name] 3/11/2024andnbsp8:15 A M Medical Record Number: [id number] Patient Account Number: [id number] Date of Birth/Gender: Treating RN: [birth year] (77 y.o. [name] Primary Care Physician: Wilkinson, [place] Other Clinician: Referring Physician: Treating Physician/Extender: Lacie Draft, ELA INE Weeks in Treatment: 83 Education Assessment Education Provided To: Patient Education Topics Provided Venous: Methods: Explain/Verbal Responses: Reinforcements needed, State content correctly Wound/Skin Impairment: Methods: Explain/Verbal Responses: Reinforcements needed, State content correctly Electronic Signature(s) Signed: 03/05/2023 5:44:44 PM By: Baruch Gouty RN, BSN Entered By: Baruch Gouty on 03/05/2023 08:51:05 -------------------------------------------------------------------------------- Wound Assessment Details Patient Name: Date of Service: Sherry Masson RO N [name]. 03/05/2023 8:15 A M Medical Record Number: [id number] Patient Account Number: [id number] Date of Birth/Sex: Treating RN: July 03, 1946 (77 y.o. [name] Primary Care Arlin Sass: Marcy Panning Other Clinician: Referring Deavon Podgorski: Treating Daysean Tinkham/Extender: Lacie Draft, ELA INE Weeks in Treatment: 23 Wound Status Wound Number: 4  Primary Skin Tear Etiology: Wound Location: Left, Medial Lower Leg [name], [name] ([id number].pdf Page 6 of 7 Wound Open Wounding Event: Skin Tear/Laceration Status: Date Acquired: 02/15/2023 Comorbid Asthma, Chronic Obstructive Pulmonary Disease (COPD), Weeks Of Treatment: 2 History: Congestive Heart Failure, Hypertension, Rheumatoid Arthritis, Clustered Wound: No Neuropathy, Received Chemotherapy Photos Wound Measurements Length: (cm) 1 Width: (cm) 0.3 Depth: (cm) 0.1 Area: (cm) 0.236 Volume: (cm) 0.024 % Reduction in Area: 85% % Reduction in Volume: 84.7% Epithelialization: Medium (34-66%) Tunneling: No Undermining: No Wound Description Classification: Partial Thickness Wound Margin: Flat and Intact Exudate Amount: Small Exudate Type: Serosanguineous Exudate Color: red, brown Foul Odor After Cleansing: No Slough/Fibrino No Wound Bed Granulation Amount: Large (67-100%) Exposed Structure Granulation Quality: Red Fascia Exposed: No Necrotic Amount: Small (1-33%) Fat Layer (Subcutaneous Tissue) Exposed: Yes Necrotic Quality: Adherent Slough Tendon Exposed: No Muscle Exposed: No Joint Exposed: No Bone Exposed: No Periwound Skin Texture Texture Color No Abnormalities Noted: Yes No Abnormalities Noted: Yes Moisture Temperature / Pain No Abnormalities Noted: Yes Temperature: No Abnormality Tenderness on Palpation: Yes Treatment Notes Wound #4 (Lower Leg) Wound Laterality: Left, Medial Cleanser Soap and Water Discharge Instruction: May shower and wash wound with dial antibacterial soap and water prior to dressing change. Wound Cleanser Discharge Instruction: Cleanse the wound with wound cleanser prior to applying a clean dressing using gauze sponges, not tissue or cotton balls. Peri-Wound Care Topical Primary Dressing Sorbalgon AG Dressing, 4x4 (in/in) Discharge Instruction: Apply to wound bed as instructed Secondary  Dressing Woven Gauze Sponge, Non-Sterile 4x4 in Discharge Instruction: Apply over primary dressing as directed. Secured With [employer], 2x10 (in/yd) [name], [name] ([id number]) [id number] Page  7 of 7 Discharge Instruction: Secure dressing with tape as directed. Compression Wrap Kerlix Roll 4.5x3.1 (in/yd) Discharge Instruction: Apply Kerlix and Coban compression as directed. Coban Self-Adherent Wrap 4x5 (in/yd) Discharge Instruction: Apply over Kerlix as directed. Compression Stockings Add-Ons Electronic Signature(s) Signed: 03/05/2023 5:44:44 PM By: Baruch Gouty RN, BSN Entered By: Baruch Gouty on 03/05/2023 08:47:39 -------------------------------------------------------------------------------- [place] Details Patient Name: Date of Service: Sherry Masson RO N [name]. 03/05/2023 8:15 A M Medical Record Number: [id number] Patient Account Number: [id number] Date of Birth/Sex: Treating RN: 12/15/1946 (77 y.o. [name] Primary Care Angelo Caroll: Judie Bonus INE Other Clinician: Referring Maria Coin: Treating Tamecca Artiga/Extender: Lacie Draft, ELA INE Weeks in Treatment: 23 Vital Signs Time Taken: 08:40 Temperature (F): 97.5 Height (in): 64 Pulse (bpm): 116 Weight (lbs): 111 Respiratory Rate (breaths/min): 18 Body Mass Index (BMI): 19.1 Blood Pressure (mmHg): 136/75 Reference Range: 80 - 120 mg / dl Electronic Signature(s) Signed: 03/05/2023 5:44:44 PM By: Baruch Gouty RN, BSN Entered By: Baruch Gouty on 03/05/2023 08:40:14

## 2023-03-06 NOTE — Patient Instructions (Signed)
[facility]  Discharge Instructions: Thank you for choosing [facility] to provide your oncology and hematology care.   If you have a lab appointment with the Cancer Center, please go directly to the Cancer Center and check in at the registration area.   Wear comfortable clothing and clothing appropriate for easy access to any Portacath or PICC line.   We strive to give you quality time with your provider. You may need to reschedule your appointment if you arrive late (15 or more minutes).  Arriving late affects you and other patients whose appointments are after yours.  Also, if you miss three or more appointments without notifying the office, you may be dismissed from the clinic at the provider's discretion.      For prescription refill requests, have your pharmacy contact our office and allow 72 hours for refills to be completed.    Today you received the following chemotherapy and/or immunotherapy agents: Vidaza     To help prevent nausea and vomiting after your treatment, we encourage you to take your nausea medication as directed.  BELOW ARE SYMPTOMS THAT SHOULD BE REPORTED IMMEDIATELY: *FEVER GREATER THAN 100.4 F (38 C) OR HIGHER *CHILLS OR SWEATING *NAUSEA AND VOMITING THAT IS NOT CONTROLLED WITH YOUR NAUSEA MEDICATION *UNUSUAL SHORTNESS OF BREATH *UNUSUAL BRUISING OR BLEEDING *URINARY PROBLEMS (pain or burning when urinating, or frequent urination) *BOWEL PROBLEMS (unusual diarrhea, constipation, pain near the anus) TENDERNESS IN MOUTH AND THROAT WITH OR WITHOUT PRESENCE OF ULCERS (sore throat, sores in mouth, or a toothache) UNUSUAL RASH, SWELLING OR PAIN  UNUSUAL VAGINAL DISCHARGE OR ITCHING   Items with * indicate a potential emergency and should be followed up as soon as possible or go to the Emergency Department if any problems should occur.  Please show the CHEMOTHERAPY ALERT CARD or IMMUNOTHERAPY ALERT CARD at check-in  to the Emergency Department and triage nurse.  Should you have questions after your visit or need to cancel or reschedule your appointment, please contact [facility]  Dept: [phone]  and follow the prompts.  Office hours are 8:00 a.m. to 4:30 p.m. Monday - Friday. Please note that voicemails left after 4:00 p.m. may not be returned until the following business day.  We are closed weekends and major holidays. You have access to a nurse at all times for urgent questions. Please call the main number to the clinic Dept: [phone] and follow the prompts.   For any non-urgent questions, you may also contact your provider using MyChart. We now offer e-Visits for anyone 18 and older to request care online for non-urgent symptoms. For details visit [email or web address].   Also download the MyChart app! Go to the app store, search "MyChart", open the app, select [place], and log in with your MyChart username and password.  

## 2023-03-07 ENCOUNTER — Inpatient Hospital Stay: Payer: Medicare Other

## 2023-03-07 VITALS — BP 125/75 | HR 73 | Temp 97.8°F | Resp 17

## 2023-03-07 DIAGNOSIS — C9310 Chronic myelomonocytic leukemia not having achieved remission: Secondary | ICD-10-CM | POA: Diagnosis not present

## 2023-03-07 DIAGNOSIS — Z5111 Encounter for antineoplastic chemotherapy: Secondary | ICD-10-CM | POA: Diagnosis not present

## 2023-03-07 MED ORDER — SODIUM CHLORIDE 0.9 % IV SOLN
75.0000 mg/m2 | Freq: Once | INTRAVENOUS | Status: AC
  Administered 2023-03-07: 114 mg via INTRAVENOUS
  Filled 2023-03-07: qty 11.4

## 2023-03-07 MED ORDER — SODIUM CHLORIDE 0.9 % IV SOLN: Freq: Once | INTRAVENOUS | Status: AC

## 2023-03-07 MED ORDER — SODIUM CHLORIDE 0.9% FLUSH
10.0000 mL | INTRAVENOUS | Status: DC | PRN
  Administered 2023-03-07: 10 mL

## 2023-03-07 MED ORDER — HEPARIN SOD (PORK) LOCK FLUSH 100 UNIT/ML IV SOLN
500.0000 [IU] | Freq: Once | INTRAVENOUS | Status: AC | PRN
  Administered 2023-03-07: 500 [IU]

## 2023-03-07 MED ORDER — ONDANSETRON HCL 4 MG/2ML IJ SOLN
8.0000 mg | Freq: Once | INTRAMUSCULAR | Status: AC
  Administered 2023-03-07: 8 mg via INTRAVENOUS
  Filled 2023-03-07: qty 4

## 2023-03-07 NOTE — Patient Instructions (Signed)
[facility]  Discharge Instructions: Thank you for choosing [facility] to provide your oncology and hematology care.   If you have a lab appointment with the Cancer Center, please go directly to the Cancer Center and check in at the registration area.   Wear comfortable clothing and clothing appropriate for easy access to any Portacath or PICC line.   We strive to give you quality time with your provider. You may need to reschedule your appointment if you arrive late (15 or more minutes).  Arriving late affects you and other patients whose appointments are after yours.  Also, if you miss three or more appointments without notifying the office, you may be dismissed from the clinic at the provider's discretion.      For prescription refill requests, have your pharmacy contact our office and allow 72 hours for refills to be completed.    Today you received the following chemotherapy and/or immunotherapy agents: Vidaza     To help prevent nausea and vomiting after your treatment, we encourage you to take your nausea medication as directed.  BELOW ARE SYMPTOMS THAT SHOULD BE REPORTED IMMEDIATELY: *FEVER GREATER THAN 100.4 F (38 C) OR HIGHER *CHILLS OR SWEATING *NAUSEA AND VOMITING THAT IS NOT CONTROLLED WITH YOUR NAUSEA MEDICATION *UNUSUAL SHORTNESS OF BREATH *UNUSUAL BRUISING OR BLEEDING *URINARY PROBLEMS (pain or burning when urinating, or frequent urination) *BOWEL PROBLEMS (unusual diarrhea, constipation, pain near the anus) TENDERNESS IN MOUTH AND THROAT WITH OR WITHOUT PRESENCE OF ULCERS (sore throat, sores in mouth, or a toothache) UNUSUAL RASH, SWELLING OR PAIN  UNUSUAL VAGINAL DISCHARGE OR ITCHING   Items with * indicate a potential emergency and should be followed up as soon as possible or go to the Emergency Department if any problems should occur.  Please show the CHEMOTHERAPY ALERT CARD or IMMUNOTHERAPY ALERT CARD at check-in  to the Emergency Department and triage nurse.  Should you have questions after your visit or need to cancel or reschedule your appointment, please contact [facility]  Dept: [phone]  and follow the prompts.  Office hours are 8:00 a.m. to 4:30 p.m. Monday - Friday. Please note that voicemails left after 4:00 p.m. may not be returned until the following business day.  We are closed weekends and major holidays. You have access to a nurse at all times for urgent questions. Please call the main number to the clinic Dept: [phone] and follow the prompts.   For any non-urgent questions, you may also contact your provider using MyChart. We now offer e-Visits for anyone 18 and older to request care online for non-urgent symptoms. For details visit [email or web address].   Also download the MyChart app! Go to the app store, search "MyChart", open the app, select [place], and log in with your MyChart username and password.  

## 2023-03-08 ENCOUNTER — Inpatient Hospital Stay: Payer: Medicare Other

## 2023-03-08 VITALS — BP 136/62 | HR 66 | Temp 98.8°F | Resp 16

## 2023-03-08 DIAGNOSIS — C9310 Chronic myelomonocytic leukemia not having achieved remission: Secondary | ICD-10-CM | POA: Diagnosis not present

## 2023-03-08 DIAGNOSIS — Z5111 Encounter for antineoplastic chemotherapy: Secondary | ICD-10-CM | POA: Diagnosis not present

## 2023-03-08 MED ORDER — SODIUM CHLORIDE 0.9 % IV SOLN
75.0000 mg/m2 | Freq: Once | INTRAVENOUS | Status: AC
  Administered 2023-03-08: 114 mg via INTRAVENOUS
  Filled 2023-03-08: qty 11.4

## 2023-03-08 MED ORDER — SODIUM CHLORIDE 0.9 % IV SOLN: Freq: Once | INTRAVENOUS | Status: AC

## 2023-03-08 MED ORDER — ONDANSETRON HCL 4 MG/2ML IJ SOLN
8.0000 mg | Freq: Once | INTRAMUSCULAR | Status: AC
  Administered 2023-03-08: 8 mg via INTRAVENOUS
  Filled 2023-03-08: qty 4

## 2023-03-08 NOTE — Patient Instructions (Signed)
[facility]  Discharge Instructions: Thank you for choosing [facility] to provide your oncology and hematology care.   If you have a lab appointment with the [facility], please go directly to the [facility] and check in at the registration area.   Wear comfortable clothing and clothing appropriate for easy access to any Portacath or PICC line.   We strive to give you quality time with your provider. You may need to reschedule your appointment if you arrive late (15 or more minutes).  Arriving late affects you and other patients whose appointments are after yours.  Also, if you miss three or more appointments without notifying the office, you may be dismissed from the clinic at the provider's discretion.      For prescription refill requests, have your pharmacy contact our office and allow 72 hours for refills to be completed.    Today you received the following chemotherapy and/or immunotherapy agents vidaza       To help prevent nausea and vomiting after your treatment, we encourage you to take your nausea medication as directed.  BELOW ARE SYMPTOMS THAT SHOULD BE REPORTED IMMEDIATELY: *FEVER GREATER THAN 100.4 F (38 C) OR HIGHER *CHILLS OR SWEATING *NAUSEA AND VOMITING THAT IS NOT CONTROLLED WITH YOUR NAUSEA MEDICATION *UNUSUAL SHORTNESS OF BREATH *UNUSUAL BRUISING OR BLEEDING *URINARY PROBLEMS (pain or burning when urinating, or frequent urination) *BOWEL PROBLEMS (unusual diarrhea, constipation, pain near the anus) TENDERNESS IN MOUTH AND THROAT WITH OR WITHOUT PRESENCE OF ULCERS (sore throat, sores in mouth, or a toothache) UNUSUAL RASH, SWELLING OR PAIN  UNUSUAL VAGINAL DISCHARGE OR ITCHING   Items with * indicate a potential emergency and should be followed up as soon as possible or go to the Emergency Department if any problems should occur.  Please show the CHEMOTHERAPY ALERT CARD or IMMUNOTHERAPY ALERT CARD at  check-in to the Emergency Department and triage nurse.  Should you have questions after your visit or need to cancel or reschedule your appointment, please contact [facility]  Dept: [phone]  and follow the prompts.  Office hours are 8:00 a.m. to 4:30 p.m. Monday - Friday. Please note that voicemails left after 4:00 p.m. may not be returned until the following business day.  We are closed weekends and major holidays. You have access to a nurse at all times for urgent questions. Please call the main number to the clinic Dept: [phone] and follow the prompts.   For any non-urgent questions, you may also contact your provider using MyChart. We now offer e-Visits for anyone 65 and older to request care online for non-urgent symptoms. For details visit [email or web address].   Also download the MyChart app! Go to the app store, search "MyChart", open the app, select [place] [facility], and log in with your MyChart username and password.

## 2023-03-09 ENCOUNTER — Inpatient Hospital Stay: Payer: Medicare Other

## 2023-03-09 VITALS — BP 128/54 | HR 65 | Temp 98.1°F | Resp 16

## 2023-03-09 DIAGNOSIS — C9310 Chronic myelomonocytic leukemia not having achieved remission: Secondary | ICD-10-CM | POA: Diagnosis not present

## 2023-03-09 DIAGNOSIS — Z5111 Encounter for antineoplastic chemotherapy: Secondary | ICD-10-CM | POA: Diagnosis not present

## 2023-03-09 MED ORDER — ONDANSETRON HCL 4 MG/2ML IJ SOLN
8.0000 mg | Freq: Once | INTRAMUSCULAR | Status: AC
  Administered 2023-03-09: 8 mg via INTRAVENOUS
  Filled 2023-03-09: qty 4

## 2023-03-09 MED ORDER — SODIUM CHLORIDE 0.9% FLUSH: 10.0000 mL | INTRAVENOUS | Status: DC | PRN

## 2023-03-09 MED ORDER — SODIUM CHLORIDE 0.9 % IV SOLN
75.0000 mg/m2 | Freq: Once | INTRAVENOUS | Status: AC
  Administered 2023-03-09: 114 mg via INTRAVENOUS
  Filled 2023-03-09: qty 11.4

## 2023-03-09 MED ORDER — HEPARIN SOD (PORK) LOCK FLUSH 100 UNIT/ML IV SOLN: 500.0000 [IU] | Freq: Once | INTRAVENOUS | Status: DC | PRN

## 2023-03-09 MED ORDER — SODIUM CHLORIDE 0.9 % IV SOLN: Freq: Once | INTRAVENOUS | Status: AC

## 2023-03-09 NOTE — Patient Instructions (Signed)
[facility]  Discharge Instructions: Thank you for choosing [facility] to provide your oncology and hematology care.   If you have a lab appointment with the Cancer Center, please go directly to the Cancer Center and check in at the registration area.   Wear comfortable clothing and clothing appropriate for easy access to any Portacath or PICC line.   We strive to give you quality time with your provider. You may need to reschedule your appointment if you arrive late (15 or more minutes).  Arriving late affects you and other patients whose appointments are after yours.  Also, if you miss three or more appointments without notifying the office, you may be dismissed from the clinic at the provider's discretion.      For prescription refill requests, have your pharmacy contact our office and allow 72 hours for refills to be completed.    Today you received the following chemotherapy and/or immunotherapy agents: Vidaza     To help prevent nausea and vomiting after your treatment, we encourage you to take your nausea medication as directed.  BELOW ARE SYMPTOMS THAT SHOULD BE REPORTED IMMEDIATELY: *FEVER GREATER THAN 100.4 F (38 C) OR HIGHER *CHILLS OR SWEATING *NAUSEA AND VOMITING THAT IS NOT CONTROLLED WITH YOUR NAUSEA MEDICATION *UNUSUAL SHORTNESS OF BREATH *UNUSUAL BRUISING OR BLEEDING *URINARY PROBLEMS (pain or burning when urinating, or frequent urination) *BOWEL PROBLEMS (unusual diarrhea, constipation, pain near the anus) TENDERNESS IN MOUTH AND THROAT WITH OR WITHOUT PRESENCE OF ULCERS (sore throat, sores in mouth, or a toothache) UNUSUAL RASH, SWELLING OR PAIN  UNUSUAL VAGINAL DISCHARGE OR ITCHING   Items with * indicate a potential emergency and should be followed up as soon as possible or go to the Emergency Department if any problems should occur.  Please show the CHEMOTHERAPY ALERT CARD or IMMUNOTHERAPY ALERT CARD at check-in  to the Emergency Department and triage nurse.  Should you have questions after your visit or need to cancel or reschedule your appointment, please contact [facility]  Dept: [phone]  and follow the prompts.  Office hours are 8:00 a.m. to 4:30 p.m. Monday - Friday. Please note that voicemails left after 4:00 p.m. may not be returned until the following business day.  We are closed weekends and major holidays. You have access to a nurse at all times for urgent questions. Please call the main number to the clinic Dept: [phone] and follow the prompts.   For any non-urgent questions, you may also contact your provider using MyChart. We now offer e-Visits for anyone 18 and older to request care online for non-urgent symptoms. For details visit [email or web address].   Also download the MyChart app! Go to the app store, search "MyChart", open the app, select [place], and log in with your MyChart username and password.  

## 2023-03-12 ENCOUNTER — Encounter (HOSPITAL_BASED_OUTPATIENT_CLINIC_OR_DEPARTMENT_OTHER): Payer: Medicare Other | Admitting: Internal Medicine

## 2023-03-12 DIAGNOSIS — J449 Chronic obstructive pulmonary disease, unspecified: Secondary | ICD-10-CM | POA: Diagnosis not present

## 2023-03-12 DIAGNOSIS — G629 Polyneuropathy, unspecified: Secondary | ICD-10-CM | POA: Diagnosis not present

## 2023-03-12 DIAGNOSIS — I5022 Chronic systolic (congestive) heart failure: Secondary | ICD-10-CM | POA: Diagnosis not present

## 2023-03-12 DIAGNOSIS — N183 Chronic kidney disease, stage 3 unspecified: Secondary | ICD-10-CM | POA: Diagnosis not present

## 2023-03-12 DIAGNOSIS — L97822 Non-pressure chronic ulcer of other part of left lower leg with fat layer exposed: Secondary | ICD-10-CM | POA: Diagnosis not present

## 2023-03-12 DIAGNOSIS — I130 Hypertensive heart and chronic kidney disease with heart failure and stage 1 through stage 4 chronic kidney disease, or unspecified chronic kidney disease: Secondary | ICD-10-CM | POA: Diagnosis not present

## 2023-03-19 ENCOUNTER — Other Ambulatory Visit: Payer: Self-pay | Admitting: Physician Assistant

## 2023-03-19 NOTE — Progress Notes (Signed)
SHADAE, [name] ([id number]) [id number] Page 1 of 1 Visit Report for 03/12/2023 SuperBill Details Patient Name: Date of Service: [name], [name] 03/12/2023 Medical Record Number: [id number] Patient Account Number: [id number] Date of Birth/Sex: Treating RN: [birth year] (77 y.o. 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

## 2023-03-19 NOTE — Progress Notes (Signed)
HIMA, LINNEY ([id number]) [id number] Page 1 of 5 Visit Report for 03/12/2023 Arrival Information Details Patient Name: Date of Service: [name], [name] 03/12/2023 11:15 A M Medical Record Number: [id number] Patient Account Number: [id number] Date of Birth/Sex: Treating RN: [birth year] (77 y.o. Female) Primary Care Alaney Witter: Judie Bonus INE Other Clinician: Referring Khloei Spiker: Treating Cortney Beissel/Extender: [name] Ream, ELA INE Weeks in Treatment: 24 Visit Information History Since Last Visit Added or deleted any medications: No Patient Arrived: Ambulatory Any new allergies or adverse reactions: No Arrival Time: 11:17 Had a fall or experienced change in No Accompanied By: self activities of daily living that may affect Transfer Assistance: None risk of falls: Patient Identification Verified: Yes Signs or symptoms of abuse/neglect since last visito No Secondary Verification Process Completed: Yes Hospitalized since last visit: No Patient Requires Transmission-Based Precautions: No Implantable device outside of the clinic excluding No Patient Has Alerts: No cellular tissue based products placed in the center since last visit: Has Dressing in Place as Prescribed: Yes Pain Present Now: No Electronic Signature(s) Signed: 03/19/2023 4:17:02 PM By: Erenest Blank Entered By: Erenest Blank on 03/12/2023 11:17:34 -------------------------------------------------------------------------------- Clinic Level of Care Assessment Details Patient Name: Date of Service: [name], [name] 03/12/2023 11:15 A M Medical Record Number: [id number] Patient Account Number: [id number] Date of Birth/Sex: Treating RN: [birth year] (77 y.o. Female) Primary Care Jacqueline Spofford: Judie Bonus INE Other Clinician: Referring Natesha Hassey: Treating Omnia Dollinger/Extender: [name] Ream, ELA INE Weeks in Treatment: 24 Clinic Level of Care Assessment Items TOOL 4  Quantity Score X- 1 0 Use when only an EandM is performed on FOLLOW-UP visit ASSESSMENTS - Nursing Assessment / Reassessment X- 1 10 Reassessment of Co-morbidities (includes updates in patient status) X- 1 5 Reassessment of Adherence to Treatment Plan ASSESSMENTS - Wound and Skin A ssessment / Reassessment X - Simple Wound Assessment / Reassessment - one wound 1 5 []  - 0 Complex Wound Assessment / Reassessment - multiple wounds []  - 0 Dermatologic / Skin Assessment (not related to wound area) ASSESSMENTS - Focused Assessment []  - 0 Circumferential Edema Measurements - multi extremities []  - 0 Nutritional Assessment / Counseling / Intervention []  - 0 Lower Extremity Assessment (monofilament, tuning fork, pulses) []  - 0 Peripheral Arterial Disease Assessment (using hand held doppler) ASSESSMENTS - Ostomy and/or Continence Assessment and Care []  - 0 Incontinence Assessment and Management []  - 0 Ostomy Care Assessment and Management (repouching, etc.) PROCESS - Coordination of Care X - Simple Patient / Family Education for ongoing care 1 [address] [place] ([id number]) [phone].pdf Page 2 of 5 []  - 0 Complex (extensive) Patient / Family Education for ongoing care []  - 0 Staff obtains Programmer, systems, Records, T Results / Process Orders est []  - 0 Staff telephones HHA, Nursing Homes / Clarify orders / etc []  - 0 Routine Transfer to another Facility (non-emergent condition) []  - 0 Routine Hospital Admission (non-emergent condition) []  - 0 New Admissions / Biomedical engineer / Ordering NPWT Apligraf, etc. , []  - 0 Emergency Hospital Admission (emergent condition) X- 1 10 Simple Discharge Coordination []  - 0 Complex (extensive) Discharge Coordination PROCESS - Special Needs []  - 0 Pediatric / Minor Patient Management []  - 0 Isolation Patient Management []  - 0 Hearing / Language / Visual special needs []  - 0 Assessment of Community assistance  (transportation, D/C planning, etc.) []  - 0 Additional assistance / Altered mentation []  - 0 Support Surface(s) Assessment (bed, cushion, seat, etc.) INTERVENTIONS - Wound Cleansing / Measurement X - Simple  Wound Cleansing - one wound 1 5 []  - 0 Complex Wound Cleansing - multiple wounds []  - 0 Wound Imaging (photographs - any number of wounds) []  - 0 Wound Tracing (instead of photographs) []  - 0 Simple Wound Measurement - one wound []  - 0 Complex Wound Measurement - multiple wounds INTERVENTIONS - Wound Dressings []  - 0 Small Wound Dressing one or multiple wounds X- 1 15 Medium Wound Dressing one or multiple wounds []  - 0 Large Wound Dressing one or multiple wounds []  - 0 Application of Medications - topical []  - 0 Application of Medications - injection INTERVENTIONS - Miscellaneous []  - 0 External ear exam []  - 0 Specimen Collection (cultures, biopsies, blood, body fluids, etc.) []  - 0 Specimen(s) / Culture(s) sent or taken to Lab for analysis []  - 0 Patient Transfer (multiple staff / Civil Service fast streamer / Similar devices) []  - 0 Simple Staple / Suture removal (25 or less) []  - 0 Complex Staple / Suture removal (26 or more) []  - 0 Hypo / Hyperglycemic Management (close monitor of Blood Glucose) []  - 0 Ankle / Brachial Index (ABI) - do not check if billed separately []  - 0 Vital Signs Has the patient been seen at the hospital within the last three years: Yes Total Score: 65 Level Of Care: New/Established - Level 2 Electronic Signature(s) Signed: 03/19/2023 4:17:02 PM By: Erenest Blank Entered By: Erenest Blank on 03/12/2023 11:44:11 [place], [name] ([id number] Page 3 of 5 -------------------------------------------------------------------------------- Encounter Discharge Information Details Patient Name: Date of Service: [name], [name] 03/12/2023 11:15 A M Medical Record Number: [id number] Patient Account Number:  [id number] Date of Birth/Sex: Treating RN: [birth year] (77 y.o. Female) Primary Care Rider Ermis: Judie Bonus INE Other Clinician: Erenest Blank Referring Jamarrion Budai: Treating Jere Bostrom/Extender: [name] Ream, ELA INE Weeks in Treatment: 24 Encounter Discharge Information Items Discharge Condition: Stable Ambulatory Status: Walker Discharge Destination: Home Transportation: Private Auto Accompanied By: self Schedule Follow-up Appointment: Yes Clinical Summary of Care: Electronic Signature(s) Signed: 03/19/2023 4:17:02 PM By: Erenest Blank Entered By: Erenest Blank on 03/12/2023 11:45:01 -------------------------------------------------------------------------------- Patient/Caregiver Education Details Patient Name: Date of Service: [name] 3/18/2024andnbsp11:15 A M Medical Record Number: [id number] Patient Account Number: [id number] Date of Birth/Gender: Treating RN: [birth year] (77 y.o. Female) Primary Care Physician: [place], [place] Other Clinician: Erenest Blank Referring Physician: Treating Physician/Extender: [name] Ream, [place] INE Weeks in Treatment: 24 Education Assessment Education Provided To: Patient Education Topics Provided Electronic Signature(s) Signed: 03/19/2023 4:17:02 PM By: Erenest Blank Entered By: Erenest Blank on 03/12/2023 11:44:36 -------------------------------------------------------------------------------- Wound Assessment Details Patient Name: Date of Service: [name], Sherry [name] 03/12/2023 11:15 A M Medical Record Number: [id number] Patient Account Number: [id number] Date of Birth/Sex: Treating RN: 1946-05-22 (77 y.o. Female) Primary Care Starlin Steib: Judie Bonus INE Other Clinician: Referring Amore Grater: Treating Raelyn Racette/Extender: [name] Ream, ELA INE Weeks in Treatment: 24 Wound Status Wound Number: 4 Primary Etiology: Skin Tear Wound Location: Left, Medial Lower Leg Wound Status:  Open Wounding Event: Skin Tear/Laceration Date Acquired: 02/15/2023 Weeks Of Treatment: 3 Clustered Wound: No Wound Measurements Length: (cm) 1 Piano, Mitchelle H ([id number]) Width: (cm) Depth: (cm) Area: (cm) Volume: (cm) % Reduction in Area: 85% [phone].pdf Page 4 of 5 0.3 % Reduction in Volume: 84.7% 0.1 0.236 0.024 Wound Description Classification: Exudate Amount: Exudate Type: Exudate Color: Partial Thickness Small Serosanguineous red, brown Periwound Skin Texture Texture Color No Abnormalities Noted: No No Abnormalities Noted: No Moisture No Abnormalities Noted: No Treatment Notes Wound #4 (Lower  Leg) Wound Laterality: Left, Medial Cleanser Soap and Water Discharge Instruction: May shower and wash wound with dial antibacterial soap and water prior to dressing change. Wound Cleanser Discharge Instruction: Cleanse the wound with wound cleanser prior to applying a clean dressing using gauze sponges, not tissue or cotton balls. Peri-Wound Care Topical Primary Dressing Sorbalgon AG Dressing, 4x4 (in/in) Discharge Instruction: Apply to wound bed as instructed Secondary Dressing Woven Gauze Sponge, Non-Sterile 4x4 in Discharge Instruction: Apply over primary dressing as directed. Secured With Transpore Surgical Tape, 2x10 (in/yd) Discharge Instruction: Secure dressing with tape as directed. Compression Wrap Kerlix Roll 4.5x3.1 (in/yd) Discharge Instruction: Apply Kerlix and Coban compression as directed. Coban Self-Adherent Wrap 4x5 (in/yd) Discharge Instruction: Apply over Kerlix as directed. Compression Stockings Add-Ons Electronic Signature(s) Signed: 03/19/2023 4:17:02 PM By: Erenest Blank Entered By: Erenest Blank on 03/12/2023 11:17:53 -------------------------------------------------------------------------------- Vitals Details Patient Name: Date of Service: Sherry Masson [name] 03/12/2023 11:15 A M Medical Record  Number: [id number] Patient Account Number: [id number] Date of Birth/Sex: Treating RN: Jun 15, 1946 (77 y.o. Female) Primary Care Chabely Norby: Judie Bonus INE Other Clinician: Referring Elysse Polidore: Treating Renika Shiflet/Extender: [name] Ream, ELA INE Weeks in Treatment: 24 Vital Signs Time Taken: 11:17 Reference Range: 80 - 120 mg / dl Height (in): [name], [name] ([id number]) [phone].pdf Page 5 of 5 Height (in): 64 Weight (lbs): 111 Body Mass Index (BMI): 19.1 Electronic Signature(s) Signed: 03/19/2023 4:17:02 PM By: Erenest Blank Entered By: Erenest Blank on 03/12/2023 11:17:41

## 2023-03-20 ENCOUNTER — Ambulatory Visit (HOSPITAL_BASED_OUTPATIENT_CLINIC_OR_DEPARTMENT_OTHER): Payer: Medicare Other | Admitting: General Surgery

## 2023-03-21 ENCOUNTER — Encounter (HOSPITAL_BASED_OUTPATIENT_CLINIC_OR_DEPARTMENT_OTHER): Payer: Medicare Other | Admitting: General Surgery

## 2023-03-21 DIAGNOSIS — S81812A Laceration without foreign body, left lower leg, initial encounter: Secondary | ICD-10-CM | POA: Diagnosis not present

## 2023-03-21 DIAGNOSIS — S46012D Strain of muscle(s) and tendon(s) of the rotator cuff of left shoulder, subsequent encounter: Secondary | ICD-10-CM | POA: Diagnosis not present

## 2023-03-21 DIAGNOSIS — G629 Polyneuropathy, unspecified: Secondary | ICD-10-CM | POA: Diagnosis not present

## 2023-03-21 DIAGNOSIS — I130 Hypertensive heart and chronic kidney disease with heart failure and stage 1 through stage 4 chronic kidney disease, or unspecified chronic kidney disease: Secondary | ICD-10-CM | POA: Diagnosis not present

## 2023-03-21 DIAGNOSIS — N183 Chronic kidney disease, stage 3 unspecified: Secondary | ICD-10-CM | POA: Diagnosis not present

## 2023-03-21 DIAGNOSIS — L97822 Non-pressure chronic ulcer of other part of left lower leg with fat layer exposed: Secondary | ICD-10-CM | POA: Diagnosis not present

## 2023-03-21 DIAGNOSIS — I5022 Chronic systolic (congestive) heart failure: Secondary | ICD-10-CM | POA: Diagnosis not present

## 2023-03-21 DIAGNOSIS — J449 Chronic obstructive pulmonary disease, unspecified: Secondary | ICD-10-CM | POA: Diagnosis not present

## 2023-03-21 NOTE — Progress Notes (Signed)
[name] ([id number]) [id number] Page 1 of 8 Visit Report for 03/21/2023 Chief Complaint Document Details Patient Name: Date of Service: [name], [name] 03/21/2023 10:30 A M Medical Record Number: [id number] Patient Account Number: [id number] Date of Birth/Sex: Treating RN: March 10, 1946 (77 y.o. F) Primary Care Provider: Marcy [name] Other Clinician: Referring Provider: Treating Provider/Extender: [name]: 26 Information Obtained from: Patient Chief Complaint Patient seen for complaints of Non-Healing Wounds. Electronic Signature(s) Signed: 03/21/2023 11:36:24 AM By: Sherry Maudlin MD FACS Entered By: [name] on 03/21/2023 11:36:24 -------------------------------------------------------------------------------- HPI Details Patient Name: Date of Service: [name]. 03/21/2023 10:30 A M Medical Record Number: [id number] Patient Account Number: [id number] Date of Birth/Sex: Treating RN: 1946-06-09 (77 y.o. F) Primary Care Provider: Marcy [name] Other Clinician: Referring Provider: Treating Provider/Extender: [name]: 61 History of Present Illness HPI Description: ADMISSION 09/20/2022 This is a 77 year old woman with a past medical history significant for congestive heart failure, atrial fibrillation, rheumatoid arthritis on long-term steroid [name], CMML currently receiving chemotherapy and moderate malnutrition. She presents to clinic today with a wound on the dorsum of her right foot. She says it started out as a bruise and then developed a bump which subsequently broke open. She says she was sent to our clinic by her oncologist. She is not diabetic and does not smoke. ABI in clinic today was 1.55. She has had formal segmental arterial Dopplers done, a little over a year ago, which did not show any evidence of occlusive  vascular disease. On the dorsal aspect of the right foot, there is a wound with a lot of old hematoma, slough, eschar, and and nonviable subcutaneous tissue. T the best of my o ability to discern, it does appear to involve the muscle layer, but there is no evidence of necrosis. The wound is pouring serous fluid; the patient has 3+ pitting edema to the bilateral lower extremities. 09/29/2022: The patient came to clinic today with nothing but a T overlying her wound. She is accompanied by her husband today. She has not elfa communicated with her cardiologist regarding her fluid overload. She still has 3+ pitting edema bilaterally. The wound is still pouring serous fluid, although not quite as effusively as at her initial visit. There is still quite a bit of nonviable tissue and hematoma present. 10/13; patient's wound on the right lateral lower leg his eschared over and may well be on its way to healing. The real problem here is on the right dorsal foot just proximal to her toes. The patient is still puzzled by the cause of this. Looking at a picture on the patient's smart phone there is a suggestion that this might have been a hematoma which seems to fit what Dr. [name] had felt on her initial evaluation. They have been using iodoform packing kerlix and Coban. 10/20; right lower leg is healed. We are approved for snap VAC on the right foot. We will try to arrange home health but until then she is going to need a nurse visit on Tuesday 10/20/2022: It has been 2 weeks since I have seen the wound. It has improved significantly since then. She is currently in a snap VAC and I think this has made quite a difference. The overall wound measurements are smaller. There is no massive drainage, as there had been previously. There is some good granulation tissue forming underneath a bit of slough. 10/30/2022: The wound  continues to improve with use of the snap VAC. The swelling in her foot is better. The  undermining continues to close in. There is a layer of slough overlying good granulation tissue. 11/06/2022: The wound has contracted further. The undermining is still present but the wound surface is better and her edema control is good. 11/13/2022: The wound is smaller again and the undermining has decreased as well. There is slough accumulation over a nice bed of granulation tissue. 11/20/2022: We did not have any snap VAC devices in clinic last week so the wound was just packed. It did well and is smaller with less undermining today. There is still slough present on the surface. 11/29/2022: The wound has contracted considerably and the undermining has resolved. There is still some slough on the wound surface. 12/13; slough on the surface of the wound required debridement however afterwards the granulation looks healthy the wound has been contracting. She has been using a snap VAC with collagen [name], [name] ([id number]) [phone].pdf Page 2 of 8 12/13/2022: The wound was too small and superficial last week and the snap VAC was discontinued. They have been using Hydrofera Blue due to the hypertrophic granulation tissue. The wound has a little bit of slough on the surface. 12/28/2022: The wound on her dorsal foot continues to contract and is quite superficial at this point. There is a little bit of slough on the surface. Unfortunately, she struck her right medial leg on a hard surface on Christmas, resulting in a laceration. It was sutured in the emergency room. She just came from having the sutures removed. Overall, I do not think the skin is likely to survive, but for now there is no grossly open ulcer at this site. 01/03/2023: Her edema is fairly significant today and not at all well-controlled. The dorsal foot wound appears deeper, but this may be an illusion secondary to the swelling. There is a fair amount of slough accumulation in the wound. The medial leg wound, as  expected, shows signs of skin necrosis and partial dehiscence. There is hematoma and necrotic fat visible. 01/10/2023: She removed her wrap about 4 days ago; despite this, both wounds are smaller and cleaner. There is still slough accumulation in both sites. 01/18/2023: She came in with her wrap on today but once it was removed, her leg began to visibly swell; it was quite an interesting phenomenon to observe. I suspect that she has poor oncotic pressure due to her low albumin. As result, her edema control is suboptimal. Both wounds are smaller with just a little bit of slough present. 01/26/2023: Both wounds are considerably smaller today. Light slough on both surfaces. Small amount of hypertrophic granulation tissue on the dorsal foot wound. 02/02/2023: Both wounds continue to contract. There is some slough and eschar on the medial leg and a little bit of slough on the dorsal foot. Edema control is much better this week. 02/09/2023: The dorsal foot wound is nearly closed. The medial leg wound is substantially smaller. There is slough on the dorsal foot and slough and eschar on the medial leg. Edema control remains good. 02/16/2023: Both the right dorsal foot wound and right medial leg wound have healed. Unfortunately, she has a new laceration on her left medial lower leg with the same etiology as the now healed right medial leg wound; part of her walker cut her leg. There is a flap of skin that is partially adherent and may or may not survive. The fat layer is exposed but  the wound is clean. 02/26/2023: Most of the skin flap looks viable. The corner at the midpoint of the wound is not, however. There is some old hematoma and slough buildup. 03/05/2023: Fortunately, the skin flap survived. There is just a small open area of the wound with some light slough accumulation. 03/21/2023: Her wound is healed. Electronic Signature(s) Signed: 03/21/2023 11:36:39 AM By: Sherry Maudlin MD FACS Entered By: [name] on 03/21/2023 11:36:39 -------------------------------------------------------------------------------- Physical Exam Details Patient Name: Date of Service: [name]. 03/21/2023 10:30 A M Medical Record Number: [id number] Patient Account Number: [id number] Date of Birth/Sex: Treating RN: [birth year] (77 y.o. F) Primary Care Provider: Marcy [name] Other Clinician: Referring Provider: Treating Provider/Extender: [name]: 26 Constitutional . . . . no acute distress. Respiratory Normal work of breathing on room air. Notes 03/21/2023: Her wound is healed. Electronic Signature(s) Signed: 03/21/2023 11:39:29 AM By: Sherry Maudlin MD FACS Entered By: [name] on 03/21/2023 11:39:29 -------------------------------------------------------------------------------- Physician Orders Details Patient Name: Date of Service: [name]. 03/21/2023 10:30 A M Medical Record Number: [id number] Patient Account Number: [id number] Date of Birth/Sex: Treating RN: 06-Sep-1946 (77 y.o. [name] Primary Care Provider: Marcy [name] Other Clinician: Referring Provider: Treating Provider/Extender: [name]: 48 Verbal / Phone Orders: No Diagnosis [address] 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 Edema Control - Lymphedema / SCD /  Other Left Lower Extremity Elevate legs to the level of the heart or above for 30 minutes daily and/or when sitting for 3-4 times a day throughout the day. Avoid standing for long periods of time. Patient to wear own compression stockings every day. Exercise regularly Other Edema Control Orders/Instructions: - Use support hose 15-20 mm/Hg both legs daily Electronic Signature(s) Signed: 03/21/2023 11:39:39 AM By: Sherry Maudlin MD FACS Entered By: [name] on 03/21/2023 11:39:38 -------------------------------------------------------------------------------- Problem List Details Patient Name: Date of Service: [name]. 03/21/2023 10:30 A M Medical Record Number: [id number] Patient Account Number: [id number] Date of Birth/Sex: Treating RN: 10/31/1946 (77 y.o. 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 09/20/2022 No Yes Inactive Problems Resolved Problems Carte, Jahlia [name] ([id number]) [phone].pdf Page 4 of 8 ICD-10 Code Description Active Date Resolved Date 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 03/21/2023 10:30 A M Medical Record Number: [id number] Patient Account Number: [id number] Date of Birth/Sex: Treating RN: 1946-08-25 (77 y.o. F) Primary Care Provider: Marcy [name] Other Clinician: Referring Provider: Treating Provider/Extender: [name]: 50 Subjective Chief Complaint Information obtained from Patient Patient seen for complaints of Non-Healing Wounds. History of Present Illness (HPI) ADMISSION 09/20/2022 This is a 77 year old woman with a past medical history significant for congestive heart failure, atrial fibrillation, rheumatoid arthritis on long-term steroid [name], CMML currently receiving chemotherapy and moderate malnutrition. She presents to clinic today with a wound on the dorsum of her right foot. She says it started out as a bruise and then developed a bump which subsequently broke open. She says she was sent to our clinic by her oncologist. She is not diabetic and does not smoke. ABI in clinic today was 1.55. She has had formal segmental arterial Dopplers done, a little over a year ago, which did not show any evidence of occlusive vascular disease. On the dorsal aspect of the right foot, there is a wound with a lot of old hematoma, slough, eschar, and and nonviable subcutaneous tissue. T the best of my o ability to discern, it does appear to involve the muscle layer, but there is no evidence of necrosis. The wound is pouring serous fluid; the patient has 3+ pitting edema to the bilateral lower extremities. 09/29/2022: The  patient came to clinic today with nothing but a T overlying her wound. She is accompanied by her husband today. She has not elfa communicated with her cardiologist regarding her fluid overload. She still has 3+ pitting edema bilaterally. The wound is still pouring serous fluid, although not quite as effusively as at her initial visit. There is still quite a bit of nonviable tissue and hematoma present. 10/13; patient's wound on the right lateral lower leg his eschared over and may well be on its way to healing. The real problem here is on the right dorsal foot just proximal to her toes. The patient is still puzzled by the cause of this. Looking at a picture on the patient's smart phone there is a suggestion that this might have been a hematoma which seems to fit what Dr. [name] had felt on her initial evaluation. They have been using iodoform packing kerlix and Coban. 10/20; right lower leg is healed. We are approved for snap VAC on the right foot. We will try to arrange home health but until then she is going to need a nurse visit on Tuesday 10/20/2022: It has been 2 weeks since I have seen the wound. It has improved significantly since then. She is currently in a snap VAC and I think this has made quite a difference. The overall wound measurements are smaller. There is no massive drainage, as there had been previously. There is some good granulation tissue forming underneath a bit of slough. 10/30/2022: The wound continues to improve with use of the snap VAC. The swelling in her foot is better. The undermining continues to close in. There is a layer of slough overlying good granulation tissue. 11/06/2022: The wound has contracted further. The undermining is still present but the wound surface is better and her edema control is good. 11/13/2022: The wound is smaller again and the undermining  has decreased as well. There is slough accumulation over a nice bed of granulation tissue. 11/20/2022: We did  not have any snap VAC devices in clinic last week so the wound was just packed. It did well and is smaller with less undermining today. There is still slough present on the surface. 11/29/2022: The wound has contracted considerably and the undermining has resolved. There is still some slough on the wound surface. 12/13; slough on the surface of the wound required debridement however afterwards the granulation looks healthy the wound has been contracting. She has been using a snap VAC with collagen 12/13/2022: The wound was too small and superficial last week and the snap VAC was discontinued. They have been using Hydrofera Blue due to the hypertrophic granulation tissue. The wound has a little bit of slough on the surface. 12/28/2022: The wound on her dorsal foot continues to contract and is quite superficial at this point. There is a little bit of slough on the surface. Unfortunately, [name], [name] ([id number]) [id number] Page 5 of 8 she struck her right medial leg on a hard surface on Christmas, resulting in a laceration. It was sutured in the emergency room. She just came from having the sutures removed. Overall, I do not think the skin is likely to survive, but for now there is no grossly open ulcer at this site. 01/03/2023: Her edema is fairly significant today and not at all well-controlled. The dorsal foot wound appears deeper, but this may be an illusion secondary to the swelling. There is a fair amount of slough accumulation in the wound. The medial leg wound, as expected, shows signs of skin necrosis and partial dehiscence. There is hematoma and necrotic fat visible. 01/10/2023: She removed her wrap about 4 days ago; despite this, both wounds are smaller and cleaner. There is still slough accumulation in both sites. 01/18/2023: She came in with her wrap on today but once it was removed, her leg began to visibly swell; it was quite an interesting phenomenon to  observe. I suspect that she has poor oncotic pressure due to her low albumin. As result, her edema control is suboptimal. Both wounds are smaller with just a little bit of slough present. 01/26/2023: Both wounds are considerably smaller today. Light slough on both surfaces. Small amount of hypertrophic granulation tissue on the dorsal foot wound. 02/02/2023: Both wounds continue to contract. There is some slough and eschar on the medial leg and a little bit of slough on the dorsal foot. Edema control is much better this week. 02/09/2023: The dorsal foot wound is nearly closed. The medial leg wound is substantially smaller. There is slough on the dorsal foot and slough and eschar on the medial leg. Edema control remains good. 02/16/2023: Both the right dorsal foot wound and right medial leg wound have healed. Unfortunately, she has a new laceration on her left medial lower leg with the same etiology as the now healed right medial leg wound; part of her walker cut her leg. There is a flap of skin that is partially adherent and may or may not survive. The fat layer is exposed but the wound is clean. 02/26/2023: Most of the skin flap looks viable. The corner at the midpoint of the wound is not, however. There is some old hematoma and slough buildup. 03/05/2023: Fortunately, the skin flap survived. There is just a small open area of the wound with some light slough accumulation. 03/21/2023: Her wound is healed. Patient History Information obtained from Patient. Family  History Unknown History, Diabetes - Father, Heart Disease - Father, No family history of Cancer. Social History Former smoker - quit 2003, Marital Status - Married, Alcohol Use - Daily, Drug Use - Prior History, Caffeine Use - Daily. Medical History Eyes Denies history of Cataracts, Glaucoma, Optic Neuritis Ear/Nose/Mouth/Throat Denies history of Chronic sinus problems/congestion, Middle ear problems Respiratory Patient has history of  Asthma, Chronic Obstructive Pulmonary Disease (COPD) Cardiovascular Patient has history of Congestive Heart Failure, Hypertension Denies history of Coronary Artery Disease, Deep Vein Thrombosis, Hypotension, Myocardial Infarction Endocrine Denies history of Type I Diabetes, Type II Diabetes Immunological Denies history of Lupus Erythematosus, Raynaudoos, Scleroderma Integumentary (Skin) Denies history of History of Burn Musculoskeletal Patient has history of Rheumatoid Arthritis Denies history of Gout Neurologic Patient has history of Neuropathy Oncologic Patient has history of Received Chemotherapy - currently receiving Hospitalization/Surgery History - reverse shoulder arthroplasty Rt- 2018. Medical A Surgical History Notes nd Cardiovascular AFIB Genitourinary Stage 3 CKD Objective Constitutional no acute distress. Vitals Time Taken: 10:52 AM, Height: 64 in, Weight: 111 lbs, BMI: 19.1, Temperature: 97.8 F, Pulse: 69 bpm, Respiratory Rate: 18 breaths/min, Blood [name], [name] ([id number]) [phone].pdf Page 6 of 8 Pressure: 130/65 mmHg. Respiratory Normal work of breathing on room air. General Notes: 03/21/2023: Her wound is healed. Integumentary (Hair, Skin) Wound #4 status is Healed - Epithelialized. Original cause of wound was Skin T ear/Laceration. The date acquired was: 02/15/2023. The wound has been in [name] 4 weeks. The wound is located on the Left,Medial Lower Leg. The wound measures 0cm length x 0cm width x 0cm depth; 0cm^2 area and 0cm^3 volume. There is no tunneling or undermining noted. There is a none present amount of drainage noted. The wound margin is flat and intact. There is no granulation within the wound bed. There is no necrotic tissue within the wound bed. The periwound skin appearance had no abnormalities noted for texture. The periwound skin appearance had no abnormalities noted for moisture. The periwound skin appearance  had no abnormalities noted for color. 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 Edema Control - Lymphedema / SCD / Other: Elevate legs to the level of the heart or above for 30 minutes daily and/or when sitting for 3-4 times a day throughout the day. Avoid standing for long periods of time. Patient to wear own compression stockings every day. Exercise regularly Other Edema Control Orders/Instructions: - Use support hose 15-20 mm/Hg both legs daily 03/21/2023: Her wound is healed. She was advised to wear compression stockings and elevate her legs is much as possible throughout the day and at night when she sleeps. She has already taken precautions with her walker to pad the areas that have caused trauma in the past so hopefully she will not end up with more wounds from her assistive devices. We will discharge her from the wound care center and she may follow-up as needed. Electronic Signature(s) Signed: 03/21/2023 11:40:26 AM By: Sherry Maudlin MD FACS Entered By: [name] on 03/21/2023 11:40:26 -------------------------------------------------------------------------------- HxROS Details Patient Name: Date of Service: [name]. 03/21/2023 10:30 A M Medical Record Number: [id number] Patient Account Number: [id number] Date of  Birth/Sex: Treating RN: [birth year] (76 y.o. F) Primary Care Provider: Marcy [name] Other Clinician: Referring Provider: Treating Provider/Extender: Sherry Draft, ELA INE Weeks in  [name]: 58 Information Obtained From Patient Eyes Medical History: Negative for: Cataracts; Glaucoma; Optic Neuritis [name], [name] ([id number]) [id number] Page 7 of 8 Ear/Nose/Mouth/Throat Medical History: Negative for: Chronic sinus problems/congestion; Middle ear problems Respiratory Medical History: Positive for: Asthma; Chronic Obstructive Pulmonary Disease (COPD) Cardiovascular Medical History: Positive for: Congestive Heart Failure; Hypertension Negative for: Coronary Artery Disease; Deep Vein Thrombosis; Hypotension; Myocardial Infarction Past Medical History Notes: AFIB Endocrine Medical History: Negative for: Type I Diabetes; Type II Diabetes Genitourinary Medical History: Past Medical History Notes: Stage 3 CKD Immunological Medical History: Negative for: Lupus Erythematosus; Raynauds; Scleroderma Integumentary (Skin) Medical History: Negative for: History of Burn Musculoskeletal Medical History: Positive for: Rheumatoid Arthritis Negative for: Gout Neurologic Medical History: Positive for: Neuropathy Oncologic Medical History: Positive for: Received Chemotherapy - currently receiving Immunizations Pneumococcal Vaccine: Received Pneumococcal Vaccination: Yes Received Pneumococcal Vaccination On or After 60th Birthday: Yes Implantable Devices None Hospitalization / Surgery History Type of Hospitalization/Surgery reverse shoulder arthroplasty Rt- 2018 Family and Social History Unknown History: Yes; Cancer: No; Diabetes: Yes - Father; Heart Disease: Yes - Father; Former smoker - quit 2003; Marital Status - Married; Alcohol Use: Daily; Drug Use: Prior History; Caffeine Use: Daily; Financial Concerns: No; Food, Clothing or Shelter Needs: No; Support System Lacking: No; Transportation Concerns: No Electronic Signature(s) Signed: 03/21/2023 12:36:47 PM By: Sherry Maudlin MD FACS Entered By: [name] on 03/21/2023  11:38:45 [name], [name] ([id number].pdf Page 8 of 8 -------------------------------------------------------------------------------- SuperBill Details Patient Name: Date of Service: [name], [name] 03/21/2023 Medical Record Number: [id number] Patient Account Number: [id number] Date of Birth/Sex: Treating RN: [birth year] (77 y.o. 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 Facility Procedures : CPT4 Code: [id number] Description: 99213 - WOUND CARE VISIT-LEV 3 EST PT Modifier: Quantity: 1 Physician Procedures : CPT4 Code Description Modifier [id number] 99213 - WC PHYS LEVEL 3 - EST PT ICD-10 Diagnosis Description L97.822 Non-pressure chronic ulcer of other part of left lower leg with fat layer exposed XX123456 Chronic systolic (congestive) heart failure Z79.52  Long term (current) use of systemic steroids 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

## 2023-03-22 NOTE — Progress Notes (Signed)
[name], [name] ([id number]) [id number] Page 1 of 7 Visit Report for 03/21/2023 Arrival Information Details Patient Name: Date of Service: [name] 03/21/2023 10:30 A M Medical Record Number: [id number] Patient Account Number: [id number] Date of Birth/Sex: Treating RN: [birth year] (77 y.o. [name], [name] Primary Care [name]: [name] [name] Other Clinician: Referring [name]: Treating [name]/Extender: [name], [name] Weeks in Treatment: 26 Visit Information History Since Last Visit Added or deleted any medications: No Patient Arrived: Walker Any new allergies or adverse reactions: No Arrival Time: 10:51 Had a fall or experienced change in No Accompanied By: spouse activities of daily living that may affect Transfer Assistance: None risk of falls: Patient Identification Verified: Yes Signs or symptoms of abuse/neglect since last visito No Secondary Verification Process Completed: Yes Hospitalized since last visit: No Patient Requires Transmission-Based Precautions: No Implantable device outside of the clinic excluding No Patient Has Alerts: No cellular tissue based products placed in the center since last visit: Has Dressing in Place as Prescribed: Yes Has Compression in Place as Prescribed: Yes Pain Present Now: No Electronic Signature(s) Signed: 03/21/2023 5:17:40 PM By: Baruch Gouty RN, BSN Entered By: Baruch Gouty on 03/21/2023 10:52:14 -------------------------------------------------------------------------------- Clinic Level of Care Assessment Details Patient Name: Date of Service: Sherry, TAGLIERI [name] 03/21/2023 10:30 A M Medical Record Number: [id number] Patient Account Number: [id number] Date of Birth/Sex: Treating RN: 1946-04-02 (77 y.o. [name] Primary Care Adrian Dinovo: [name] [name] Other Clinician: Referring Narya Beavin: Treating Keilynn Marano/Extender: [name], Sherry  [name] Weeks in Treatment: 26 Clinic Level of Care Assessment Items TOOL 4 Quantity Score []  - 0 Use when only an EandM is performed on FOLLOW-UP visit ASSESSMENTS - Nursing Assessment / Reassessment X- 1 10 Reassessment of Co-morbidities (includes updates in patient status) X- 1 5 Reassessment of Adherence to Treatment Plan ASSESSMENTS - Wound and Skin A ssessment / Reassessment X - Simple Wound Assessment / Reassessment - one wound 1 5 []  - 0 Complex Wound Assessment / Reassessment - multiple wounds []  - 0 Dermatologic / Skin Assessment (not related to wound area) ASSESSMENTS - Focused Assessment X- 1 5 Circumferential Edema Measurements - multi extremities []  - 0 Nutritional Assessment / Counseling / Intervention X- 1 5 Lower Extremity Assessment (monofilament, tuning fork, pulses) []  - 0 Peripheral Arterial Disease Assessment (using hand held doppler) ASSESSMENTS - Ostomy and/or Continence Assessment and Care []  - 0 Incontinence Assessment and Management []  - 0 Ostomy Care Assessment and Management (repouching, etc.) PROCESS - Coordination of Care [name] ([id number]) [id number] Page 2 of 7 X- 1 15 Simple Patient / Family Education for ongoing care []  - 0 Complex (extensive) Patient / Family Education for ongoing care X- 1 10 Staff obtains Programmer, systems, Records, T Results / Process Orders est []  - 0 Staff telephones HHA, Nursing Homes / Clarify orders / etc []  - 0 Routine Transfer to another Facility (non-emergent condition) []  - 0 Routine Hospital Admission (non-emergent condition) []  - 0 New Admissions / Biomedical engineer / Ordering NPWT Apligraf, etc. , []  - 0 Emergency Hospital Admission (emergent condition) X- 1 10 Simple Discharge Coordination []  - 0 Complex (extensive) Discharge Coordination PROCESS - Special Needs []  - 0 Pediatric / Minor Patient Management []  - 0 Isolation Patient Management []  - 0 Hearing /  Language / Visual special needs []  - 0 Assessment of Community assistance (transportation, D/C planning, etc.) []  - 0 Additional assistance / Altered mentation []  - 0 Support Surface(s) Assessment (bed,  cushion, seat, etc.) INTERVENTIONS - Wound Cleansing / Measurement X - Simple Wound Cleansing - one wound 1 5 []  - 0 Complex Wound Cleansing - multiple wounds X- 1 5 Wound Imaging (photographs - any number of wounds) []  - 0 Wound Tracing (instead of photographs) []  - 0 Simple Wound Measurement - one wound []  - 0 Complex Wound Measurement - multiple wounds INTERVENTIONS - Wound Dressings []  - 0 Small Wound Dressing one or multiple wounds []  - 0 Medium Wound Dressing one or multiple wounds []  - 0 Large Wound Dressing one or multiple wounds []  - 0 Application of Medications - topical []  - 0 Application of Medications - injection INTERVENTIONS - Miscellaneous []  - 0 External ear exam []  - 0 Specimen Collection (cultures, biopsies, blood, body fluids, etc.) []  - 0 Specimen(s) / Culture(s) sent or taken to Lab for analysis []  - 0 Patient Transfer (multiple staff / Civil Service fast streamer / Similar devices) []  - 0 Simple Staple / Suture removal (25 or less) []  - 0 Complex Staple / Suture removal (26 or more) []  - 0 Hypo / Hyperglycemic Management (close monitor of Blood Glucose) []  - 0 Ankle / Brachial Index (ABI) - do not check if billed separately X- 1 5 Vital Signs Has the patient been seen at the hospital within the last three years: Yes Total Score: 80 Level Of Care: New/Established - Level 3 Electronic Signature(s) Signed: 03/21/2023 5:17:40 PM By: Baruch Gouty RN, BSN [place], [place] ([id number]) [phone].pdf Page 3 of 7 Entered By: Baruch Gouty on 03/21/2023 11:31:52 -------------------------------------------------------------------------------- Encounter Discharge Information Details Patient Name: Date of Service: Sherry, [name]  03/21/2023 10:30 A M Medical Record Number: [id number] Patient Account Number: [id number] Date of Birth/Sex: Treating RN: [birth year] (77 y.o. [name] Primary Care Cuahutemoc Attar: Marcy Panning Other Clinician: Referring Jillian Warth: Treating Reginaldo Hazard/Extender: [name], [name] Weeks in Treatment: 26 Encounter Discharge Information Items Discharge Condition: Stable Ambulatory Status: Walker Discharge Destination: Home Transportation: Private Auto Accompanied By: spouse Schedule Follow-up Appointment: Yes Clinical Summary of Care: Patient Declined Electronic Signature(s) Signed: 03/21/2023 5:17:40 PM By: Baruch Gouty RN, BSN Entered By: Baruch Gouty on 03/21/2023 11:32:48 -------------------------------------------------------------------------------- Lower Extremity Assessment Details Patient Name: Date of Service: Sherry, DISIMONE [name] 03/21/2023 10:30 A M Medical Record Number: [id number] Patient Account Number: [id number] Date of Birth/Sex: Treating RN: 03-08-1946 (77 y.o. [name] Primary Care Glenice Ciccone: Marcy Panning Other Clinician: Referring Kerim Statzer: Treating Onelia Cadmus/Extender: [name], [name] Weeks in Treatment: 26 Edema Assessment Assessed: [Left: No] [Right: No] Edema: [Left: Ye] [Right: s] Calf Left: Right: Point of Measurement: From Medial Instep 24.6 cm Ankle Left: Right: Point of Measurement: From Medial Instep 18 cm Vascular Assessment Pulses: Dorsalis Pedis Palpable: [Left:Yes] Electronic Signature(s) Signed: 03/21/2023 5:17:40 PM By: Baruch Gouty RN, BSN Entered By: Baruch Gouty on 03/21/2023 10:56:06 -------------------------------------------------------------------------------- Multi Wound Chart Details Patient Name: Date of Service: Sherry Masson [name] 03/21/2023 10:30 A M Medical Record Number: [id number] Patient Account Number: [id number] Date of Birth/Sex: Treating RN: 1946/08/05 (78  y.o. F) Primary Care Amber Williard: Marcy Panning Other Clinician: Referring Shareeka Yim: Treating Xoie Kreuser/Extender: [name], [name] [place], [place] ([id number]) [id number] Page 4 of 7 Weeks in Treatment: 26 Vital Signs Height(in): 64 Pulse(bpm): 69 Weight(lbs): 111 Blood Pressure(mmHg): 130/65 Body Mass Index(BMI): 19.1 Temperature(F): 97.8 Respiratory Rate(breaths/min): 18 [4:Photos:] [N/A:N/A] Left, Medial Lower Leg N/A N/A Wound Location: Skin T ear/Laceration N/A N/A Wounding Event: Skin T ear N/A N/A  Primary Etiology: Asthma, Chronic Obstructive N/A N/A Comorbid History: Pulmonary Disease (COPD), Congestive Heart Failure, Hypertension, Rheumatoid Arthritis, Neuropathy, Received Chemotherapy 02/15/2023 N/A N/A Date Acquired: 4 N/A N/A Weeks of Treatment: Healed - Epithelialized N/A N/A Wound Status: No N/A N/A Wound Recurrence: 0x0x0 N/A N/A Measurements L x W x D (cm) 0 N/A N/A A (cm) : rea 0 N/A N/A Volume (cm) : 100.00% N/A N/A % Reduction in A rea: 100.00% N/A N/A % Reduction in Volume: Partial Thickness N/A N/A Classification: None Present N/A N/A Exudate A mount: Flat and Intact N/A N/A Wound Margin: None Present (0%) N/A N/A Granulation A mount: None Present (0%) N/A N/A Necrotic A mount: Fascia: No N/A N/A Exposed Structures: Fat Layer (Subcutaneous Tissue): No Tendon: No Muscle: No Joint: No Bone: No Large (67-100%) N/A N/A Epithelialization: No Abnormalities Noted N/A N/A Periwound Skin Texture: No Abnormalities Noted N/A N/A Periwound Skin Moisture: No Abnormalities Noted N/A N/A Periwound Skin Color: scabbed N/A N/A Assessment Notes: Treatment Notes Electronic Signature(s) Signed: 03/21/2023 11:36:15 AM By: Fredirick Maudlin MD FACS Entered By: Fredirick Maudlin on 03/21/2023  11:36:15 -------------------------------------------------------------------------------- Multi-Disciplinary Care Plan Details Patient Name: Date of Service: Sherry Masson [name] 03/21/2023 10:30 A M Medical Record Number: [id number] Patient Account Number: [id number] Date of Birth/Sex: Treating RN: 01/22/1946 (77 y.o. [name] Primary Care Jenayah Antu: Marcy Panning Other Clinician: Referring Leiah Giannotti: Treating Yusuke Beza/Extender: [name], [name] Weeks in Treatment: [place] reviewed with physician [address] [place]ISHAAL, [place] ([id number]) [id number] Page 5 of 7 Electronic Signature(s) Signed: 03/21/2023 5:17:40 PM By: Baruch Gouty RN, BSN Entered By: Baruch Gouty on 03/21/2023 11:32:14 -------------------------------------------------------------------------------- Pain Assessment Details Patient Name: Date of Service: Sherry, KYGER [name] 03/21/2023 10:30 A M Medical Record Number: [id number] Patient Account Number: [id number] Date of Birth/Sex: Treating RN: 07-26-1946 (77 y.o. [name] Primary Care Saher Davee: Marcy Panning Other Clinician: Referring Shiana Rappleye: Treating Jacorion Klem/Extender: [name], [name] Weeks in Treatment: 26 Active Problems Location of Pain Severity and Description of Pain Patient Has Paino No Site Locations Rate the pain. Current Pain Level: 0 Pain Management and Medication Current Pain Management: Electronic Signature(s) Signed: 03/21/2023 5:17:40 PM By: Baruch Gouty RN, BSN Entered By: Baruch Gouty on 03/21/2023 10:52:49 -------------------------------------------------------------------------------- Patient/Caregiver Education Details Patient Name: Date of Service: [name] 3/27/2024andnbsp10:30 A M Medical Record Number: [id number] Patient Account Number: [id number] Date of Birth/Gender: Treating RN: 28-Jun-1946 (77  y.o. [name] Primary Care Physician: [place], [place] Other Clinician: Referring Physician: Treating Physician/Extender: [name], [name] Weeks in Treatment: 34 Education Assessment Education Provided To: Patient Education Topics Provided Venous: Methods: Explain/Verbal Responses: Reinforcements needed, State content correctly Wound/Skin Impairment: Methods: Explain/Verbal [place], [place] Milee H ([id number]) [phone].pdf Page 6 of 7 Responses: Reinforcements needed, State content correctly Electronic Signature(s) Signed: 03/21/2023 5:17:40 PM By: Baruch Gouty RN, BSN Entered By: Baruch Gouty on 03/21/2023 11:04:48 -------------------------------------------------------------------------------- Wound Assessment Details Patient Name: Date of Service: Sherry Masson [name] 03/21/2023 10:30 A M Medical Record Number: [id number] Patient Account Number: [id number] Date of Birth/Sex: Treating RN: 1946-05-29 (77 y.o. [name] Primary Care Hershall Benkert: Marcy Panning Other Clinician: Referring Marelyn Rouser: Treating Benjamyn Hestand/Extender: [name], [name] Weeks in Treatment: 26 Wound Status Wound Number: 4 Primary Skin T ear Etiology: Wound Location: Left, Medial Lower Leg Wound Healed - Epithelialized Wounding Event: Skin Tear/Laceration Status: Date Acquired: 02/15/2023 Comorbid Asthma, Chronic Obstructive Pulmonary Disease (COPD), Weeks Of Treatment:  4 History: Congestive Heart Failure, Hypertension, Rheumatoid Arthritis, Clustered Wound: No Neuropathy, Received Chemotherapy Photos Wound Measurements Length: (cm) Width: (cm) Depth: (cm) Area: (cm) Volume: (cm) 0 % Reduction in Area: 100% 0 % Reduction in Volume: 100% 0 Epithelialization: Large (67-100%) 0 Tunneling: No 0 Undermining: No Wound Description Classification: Partial Thickness Wound Margin: Flat and Intact Exudate Amount: None  Present Foul Odor After Cleansing: No Slough/Fibrino No Wound Bed Granulation Amount: None Present (0%) Exposed Structure Necrotic Amount: None Present (0%) Fascia Exposed: No Fat Layer (Subcutaneous Tissue) Exposed: No Tendon Exposed: No Muscle Exposed: No Joint Exposed: No Bone Exposed: No Periwound Skin Texture Texture Color No Abnormalities Noted: Yes No Abnormalities Noted: Yes Moisture No Abnormalities Noted: Yes Assessment Notes TAVARA, ZEEMAN ([id number]) [id number] Page 7 of 7 Electronic Signature(s) Signed: 03/21/2023 5:17:40 PM By: Baruch Gouty RN, BSN Entered By: Baruch Gouty on 03/21/2023 11:27:20 -------------------------------------------------------------------------------- Vitals Details Patient Name: Date of Service: Sherry Masson [name] 03/21/2023 10:30 A M Medical Record Number: [id number] Patient Account Number: [id number] Date of Birth/Sex: Treating RN: [birth year] (77 y.o. [name] Primary Care Jerrie Schussler: [name] [name] Other Clinician: Referring Leah Skora: Treating Donnah Levert/Extender: [name], [name] Weeks in Treatment: 26 Vital Signs Time Taken: 10:52 Temperature (F): 97.8 Height (in): 64 Pulse (bpm): 69 Weight (lbs): 111 Respiratory Rate (breaths/min): 18 Body Mass Index (BMI): 19.1 Blood Pressure (mmHg): 130/65 Reference Range: 80 - 120 mg / dl Electronic Signature(s) Signed: 03/21/2023 5:17:40 PM By: Baruch Gouty RN, BSN Entered By: Baruch Gouty on 03/21/2023 10:52:39

## 2023-03-26 ENCOUNTER — Inpatient Hospital Stay: Payer: Medicare Other | Attending: Nurse Practitioner

## 2023-03-26 ENCOUNTER — Encounter: Payer: Self-pay | Admitting: Cardiovascular Disease

## 2023-03-26 DIAGNOSIS — Z5111 Encounter for antineoplastic chemotherapy: Secondary | ICD-10-CM | POA: Insufficient documentation

## 2023-03-26 DIAGNOSIS — R5383 Other fatigue: Secondary | ICD-10-CM | POA: Diagnosis not present

## 2023-03-26 DIAGNOSIS — M0579 Rheumatoid arthritis with rheumatoid factor of multiple sites without organ or systems involvement: Secondary | ICD-10-CM | POA: Diagnosis not present

## 2023-03-26 DIAGNOSIS — Z79899 Other long term (current) drug therapy: Secondary | ICD-10-CM | POA: Insufficient documentation

## 2023-03-26 DIAGNOSIS — C9310 Chronic myelomonocytic leukemia not having achieved remission: Secondary | ICD-10-CM

## 2023-03-26 DIAGNOSIS — Z95828 Presence of other vascular implants and grafts: Secondary | ICD-10-CM

## 2023-03-26 LAB — CBC WITH DIFFERENTIAL (CANCER CENTER ONLY)
Abs Immature Granulocytes: 0.08 K/uL — ABNORMAL HIGH (ref 0.00–0.07)
Basophils Absolute: 0 K/uL (ref 0.0–0.1)
Basophils Relative: 0 %
Eosinophils Absolute: 0.1 K/uL (ref 0.0–0.5)
Eosinophils Relative: 1 %
HCT: 33.6 % — ABNORMAL LOW (ref 36.0–46.0)
Hemoglobin: 11.3 g/dL — ABNORMAL LOW (ref 12.0–15.0)
Immature Granulocytes: 1 %
Lymphocytes Relative: 22 %
Lymphs Abs: 1.9 K/uL (ref 0.7–4.0)
MCH: 35.1 pg — ABNORMAL HIGH (ref 26.0–34.0)
MCHC: 33.6 g/dL (ref 30.0–36.0)
MCV: 104.3 fL — ABNORMAL HIGH (ref 80.0–100.0)
Monocytes Absolute: 1.4 K/uL — ABNORMAL HIGH (ref 0.1–1.0)
Monocytes Relative: 17 %
Neutro Abs: 5.1 K/uL (ref 1.7–7.7)
Neutrophils Relative %: 59 %
Platelet Count: 107 K/uL — ABNORMAL LOW (ref 150–400)
RBC: 3.22 MIL/uL — ABNORMAL LOW (ref 3.87–5.11)
RDW: 15.2 % (ref 11.5–15.5)
WBC Count: 8.5 K/uL (ref 4.0–10.5)
nRBC: 0 % (ref 0.0–0.2)

## 2023-03-26 LAB — CMP (CANCER CENTER ONLY)
ALT: 22 U/L (ref 0–44)
AST: 20 U/L (ref 15–41)
Albumin: 4.2 g/dL (ref 3.5–5.0)
Alkaline Phosphatase: 44 U/L (ref 38–126)
Anion gap: 9 (ref 5–15)
BUN: 23 mg/dL (ref 8–23)
CO2: 26 mmol/L (ref 22–32)
Calcium: 10.2 mg/dL (ref 8.9–10.3)
Chloride: 101 mmol/L (ref 98–111)
Creatinine: 0.94 mg/dL (ref 0.44–1.00)
GFR, Estimated: >60 mL/min
Glucose, Bld: 152 mg/dL — ABNORMAL HIGH (ref 70–99)
Potassium: 4 mmol/L (ref 3.5–5.1)
Sodium: 136 mmol/L (ref 135–145)
Total Bilirubin: 0.7 mg/dL (ref 0.3–1.2)
Total Protein: 6.5 g/dL (ref 6.5–8.1)

## 2023-03-26 MED ORDER — SODIUM CHLORIDE 0.9% FLUSH
10.0000 mL | Freq: Once | INTRAVENOUS | Status: AC
  Administered 2023-03-26: 10 mL

## 2023-03-26 MED ORDER — HEPARIN SOD (PORK) LOCK FLUSH 100 UNIT/ML IV SOLN
500.0000 [IU] | Freq: Once | INTRAVENOUS | Status: AC
  Administered 2023-03-26: 500 [IU]

## 2023-03-26 NOTE — Progress Notes (Signed)
Cardiology Office Note:    Date:  03/27/2023   ID:  [name], DOB 1946/05/04, MRN [id number]  PCP:  Maurice Small, MD   [facility] Providers Cardiologist:  Kristeen Miss, MD     Referring MD: Maurice Small, MD   Chief Complaint  Patient presents with   Atrial Fibrillation        Leg Swelling    Oct. 11, 2022   [name] is a 77 y.o. female with a hx of back pain,  CKD, HLD, COPD, paroxysmal A. fib.  We are asked to see her today by Dr. Valentina Lucks for further evaluation and management of her paroxysmal atrial fibrillation.  She has not yet been started on anticoagulation. CHADS2VASC is 71 ( female, age, HTN)   No hx of CVA, CHF, DM , no vascular disease    Asymptomatic  Cannot tell when her HR is irreg.  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 Valentina Lucks indicate a previous GI bleed and colonoscopy with Dr Loreta Ave - she does not recall this .   Jan. 17, 2023 : Seen for her PAF  Is due for her colonoscopy   Instructed her to hold eliquis for 2 days  No symptoms that she could say is Afib   Apr 28, 2022:  [name] is seen for follow up of her PAF. 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 Hx of COPD   Was in the hospital 2 weeks ago .  Found to have severe anemia and rapid afib .  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 5, 2023 [name] is seen for follow up of her atrial fib,   anemia, failure to thrive, COPD,  CML Wt is 114 lbs.   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 10, 2024  Is gaining her strength.   Eating a little better.  Is perhaps a litte stronger  Wt today is 106 lbs  Takes chemo every 6 weeks .   March 27, 2023 [name] is seen for her HTN, failure to thrive , COPD  On chemo for her CML Wt is 105 lb Trying to eat    Leg edema Walked in with a walker today   No CP , no dyspnea   Overall she looks good      Past Medical History:  Diagnosis Date   Arthritis    Rheumatoid arthritis   Celiac disease    Chronic kidney disease    stage 3 from MD notes   COPD (chronic obstructive pulmonary disease)    Dyspnea    with going up stairs   Family history of  adverse reaction to anesthesia    father had hard time waking up   Headache    sinus headaches   Hot flashes    Hypertension    Iron deficiency anemia    Pneumonia    per patient "I have walking pneumonia"    Past Surgical History:  Procedure Laterality Date   COLONOSCOPY     ECTOPIC PREGNANCY SURGERY      x 2   IR IMAGING GUIDED PORT INSERTION  01/31/2022   REVERSE SHOULDER ARTHROPLASTY Right 02/02/2017   Procedure: RIGHT REVERSE SHOULDER ARTHROPLASTY;  Surgeon: Beverely Low, MD;  Location: [facility] OR;  Service: Orthopedics;  Laterality: Right;    Current Medications: Current Meds  Medication Sig   denosumab (PROLIA) 60 MG/ML SOSY injection Inject 60 mg into the skin every 6 (six) months.   diltiazem (CARDIZEM CD) 240 MG 24 hr capsule TAKE 1 CAPSULE(240 MG) BY MOUTH DAILY   furosemide (LASIX) 20 MG tablet Take 1 tablet (20 mg total) by mouth daily.   gabapentin (NEURONTIN) 300 MG capsule Take 300 mg by mouth in the morning.   Metoprolol Tartrate 75 MG TABS Take 150 mg by mouth 2 (two) times daily.   potassium chloride SA (KLOR-CON M20) 20 MEQ tablet Take 1 tablet (20  mEq total) by mouth daily. Take with furosemide   predniSONE (DELTASONE) 5 MG tablet Take 5 mg by mouth daily with breakfast.   STIOLTO RESPIMAT 2.5-2.5 MCG/ACT AERS INHALE 2 PUFFS INTO THE LUNGS DAILY     Allergies:   Digoxin and related, Gluten meal, Penicillins, Fexofenadine, Alendronate, and Hydroxychloroquine   Social History   Socioeconomic History   Marital status: Married    Spouse name: Not on file   Number of children: Not on file   Years of education: Not on file   Highest education level: Not on file  Occupational History   Not on file  Tobacco Use   Smoking status: Former    Packs/day: 1.00    Years: 30.00    Additional pack years: 0.00    Total pack years: 30.00    Types: Cigarettes    Quit date: 2005    Years since quitting: 19.2   Smokeless tobacco: Never  Vaping Use   Vaping Use: Never used  Substance and Sexual Activity   Alcohol use: Yes    Alcohol/week: 12.0 - 14.0 standard drinks of alcohol    Types: 12 - 14 Glasses of wine per week   Drug use: No   Sexual activity: Not Currently  Other Topics Concern   Not on file  Social History Narrative   Not on file   Social Determinants of Health   Financial Resource Strain: Not on file  Food Insecurity: Not on file  Transportation Needs: Not on file  Physical Activity: Not on file  Stress: Not on file  Social Connections: Not on file     Family History: The patient's family history includes Diabetes in her father; Peripheral Artery Disease in her father.  ROS:   Please see the history of present illness.     All other systems reviewed and are negative.  EKGs/Labs/Other Studies Reviewed:    The following studies were reviewed today: - previous ecgs     Recent Labs: 03/29/2022: TSH 0.731 05/21/2022: B Natriuretic Peptide 148.4 06/21/2022: Magnesium 1.8 03/26/2023: ALT 22; BUN 23; Creatinine 0.94; Hemoglobin 11.3; Platelet Count 107; Potassium 4.0; Sodium 136  Recent Lipid Panel No results found  for: "CHOL", "TRIG", "HDL", "  CHOLHDL", "VLDL", "LDLCALC", "LDLDIRECT"   Risk Assessment/Calculations:    CHA2DS2-VASc Score =    } This indicates a  % annual risk of stroke. The patient's score is based upon:           Physical Exam:      Physical Exam: Blood pressure 106/60, height 5\' 2"  (1.575 m), weight 105 lb 3.2 oz (47.7 kg).       GEN:  elderly female,  chronically ill , no acute distress HEENT: Normal NECK: No JVD; No carotid bruits LYMPHATICS: No lymphadenopathy CARDIAC: RRR , no murmurs, rubs, gallops RESPIRATORY:  Clear to auscultation without rales, wheezing or rhonchi  ABDOMEN: Soft, non-tender, non-distended MUSCULOSKELETAL:  trace - 1+  edema; No deformity  SKIN: Warm and dry NEUROLOGIC:  Alert and oriented x 3    EKG:     ASSESSMENT:  / Plan    1. Leg edema   Seems to be generally improved.  Advised more exercise .  Would not change meds at this point   2.  Atrial fib:  is in NSR today               2.  Leg edema:   2 + leg edema on left leg .  This has been a chronic issue.  She has relatively poor nutrition,  stasis - spends almost all day in the wheelchair.  Does not ambulate.        3.  Generalized failure to thrive:     4.  Right pleural effusion :   clini        Medication Adjustments/Labs and Tests Ordered: Current medicines are reviewed at length with the patient today.  Concerns regarding medicines are outlined above.  No orders of the defined types were placed in this encounter.   No orders of the defined types were placed in this encounter.    Patient Instructions  Medication Instructions:  Your physician recommends that you continue on your current medications as directed. Please refer to the Current Medication list given to you today.  *If you need a refill on your cardiac medications before your next appointment, please call your pharmacy*  Follow-Up: At [facility], you and your health needs are  our priority.  As part of our continuing mission to provide you with exceptional heart care, we have created designated Provider Care Teams.  These Care Teams include your primary Cardiologist (physician) and Advanced Practice Providers (APPs -  Physician Assistants and Nurse Practitioners) who all work together to provide you with the care you need, when you need it.  Your next appointment:   6 month(s)  Provider:   Kristeen Miss, MD       Signed, Kristeen Miss, MD  03/27/2023 11:31 AM    [facility]

## 2023-03-27 ENCOUNTER — Encounter: Payer: Self-pay | Admitting: Cardiovascular Disease

## 2023-03-27 ENCOUNTER — Other Ambulatory Visit: Payer: Self-pay

## 2023-03-27 ENCOUNTER — Ambulatory Visit: Payer: Medicare Other | Attending: Cardiovascular Disease | Admitting: Cardiovascular Disease

## 2023-03-27 VITALS — BP 106/60 | Ht 62.0 in | Wt 105.2 lb

## 2023-03-27 DIAGNOSIS — R600 Localized edema: Secondary | ICD-10-CM | POA: Diagnosis not present

## 2023-03-27 NOTE — Patient Instructions (Signed)
Medication Instructions:  Your physician recommends that you continue on your current medications as directed. Please refer to the Current Medication list given to you today. *If you need a refill on your cardiac medications before your next appointment, please call your pharmacy*  Follow-Up: At [facility], you and your health needs are our priority.  As part of our continuing mission to provide you with exceptional heart care, we have created designated Provider Care Teams.  These Care Teams include your primary Cardiologist (physician) and Advanced Practice Providers (APPs -  Physician Assistants and Nurse Practitioners) who all work together to provide you with the care you need, when you need it.   Your next appointment:   6 month(s)  Provider:   Philip [name], MD  

## 2023-03-28 ENCOUNTER — Other Ambulatory Visit: Payer: Self-pay

## 2023-03-28 ENCOUNTER — Encounter: Payer: Self-pay | Admitting: Hematology

## 2023-03-28 DIAGNOSIS — E790 Hyperuricemia without signs of inflammatory arthritis and tophaceous disease: Secondary | ICD-10-CM | POA: Diagnosis not present

## 2023-03-28 DIAGNOSIS — Z681 Body mass index (BMI) 19 or less, adult: Secondary | ICD-10-CM | POA: Diagnosis not present

## 2023-03-28 DIAGNOSIS — Z79899 Other long term (current) drug therapy: Secondary | ICD-10-CM | POA: Diagnosis not present

## 2023-03-28 DIAGNOSIS — M1991 Primary osteoarthritis, unspecified site: Secondary | ICD-10-CM | POA: Diagnosis not present

## 2023-03-28 DIAGNOSIS — M0579 Rheumatoid arthritis with rheumatoid factor of multiple sites without organ or systems involvement: Secondary | ICD-10-CM | POA: Diagnosis not present

## 2023-03-28 DIAGNOSIS — C9210 Chronic myeloid leukemia, BCR/ABL-positive, not having achieved remission: Secondary | ICD-10-CM | POA: Diagnosis not present

## 2023-04-15 NOTE — Assessment & Plan Note (Signed)
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 Her BM biopsy was reviewed at [facility] and was felt to be CMML-1 with 5% blasts.  -She began azacitadine on 02/06/22. she has had multiple hospital admissions and her performance status has been very low.  Changed her chemo to every 6 weeks in 08/2022 due to her fatigue. -she is tolerating every 6 weeks much better, feels better and able to do all her ADL now -she saw Dr. Sharyne Richters in Feb 2024, who recommends to continue current therapy  -Lab reviewed, adequate for treatment, will proceed cycle 6 Vidaza -Continue to monitor her blood counts closely -Follow-up in 6 weeks for cycle 8 Vidaza

## 2023-04-16 ENCOUNTER — Inpatient Hospital Stay: Payer: Medicare Other

## 2023-04-16 ENCOUNTER — Encounter: Payer: Self-pay | Admitting: Cardiovascular Disease

## 2023-04-16 ENCOUNTER — Inpatient Hospital Stay (HOSPITAL_BASED_OUTPATIENT_CLINIC_OR_DEPARTMENT_OTHER): Payer: Medicare Other | Admitting: Hematology

## 2023-04-16 ENCOUNTER — Encounter: Payer: Self-pay | Admitting: Hematology

## 2023-04-16 VITALS — BP 112/59 | HR 67 | Temp 97.8°F | Resp 18 | Ht 62.0 in | Wt 106.0 lb

## 2023-04-16 DIAGNOSIS — C9310 Chronic myelomonocytic leukemia not having achieved remission: Secondary | ICD-10-CM

## 2023-04-16 DIAGNOSIS — Z5111 Encounter for antineoplastic chemotherapy: Secondary | ICD-10-CM | POA: Diagnosis not present

## 2023-04-16 DIAGNOSIS — Z95828 Presence of other vascular implants and grafts: Secondary | ICD-10-CM

## 2023-04-16 DIAGNOSIS — Z79899 Other long term (current) drug therapy: Secondary | ICD-10-CM | POA: Diagnosis not present

## 2023-04-16 LAB — BASIC METABOLIC PANEL - CANCER CENTER ONLY
Anion gap: 9 (ref 5–15)
BUN: 23 mg/dL (ref 8–23)
CO2: 26 mmol/L (ref 22–32)
Calcium: 9.7 mg/dL (ref 8.9–10.3)
Chloride: 100 mmol/L (ref 98–111)
Creatinine: 0.93 mg/dL (ref 0.44–1.00)
GFR, Estimated: >60 mL/min (ref >60)
Glucose, Bld: 117 mg/dL — ABNORMAL HIGH (ref 70–99)
Potassium: 4.9 mmol/L (ref 3.5–5.1)
Sodium: 135 mmol/L (ref 135–145)

## 2023-04-16 LAB — CBC WITH DIFFERENTIAL (CANCER CENTER ONLY)
Abs Immature Granulocytes: 0.2 10*3/uL — ABNORMAL HIGH (ref 0.00–0.07)
Basophils Absolute: 0.2 10*3/uL — ABNORMAL HIGH (ref 0.0–0.1)
Basophils Relative: 1 %
Eosinophils Absolute: 0.4 10*3/uL (ref 0.0–0.5)
Eosinophils Relative: 3 %
HCT: 33.5 % — ABNORMAL LOW (ref 36.0–46.0)
Hemoglobin: 11.3 g/dL — ABNORMAL LOW (ref 12.0–15.0)
Immature Granulocytes: 2 %
Lymphocytes Relative: 12 %
Lymphs Abs: 1.5 10*3/uL (ref 0.7–4.0)
MCH: 34.1 pg — ABNORMAL HIGH (ref 26.0–34.0)
MCHC: 33.7 g/dL (ref 30.0–36.0)
MCV: 101.2 fL — ABNORMAL HIGH (ref 80.0–100.0)
Monocytes Absolute: 1.9 10*3/uL — ABNORMAL HIGH (ref 0.1–1.0)
Monocytes Relative: 15 %
Neutro Abs: 8.9 10*3/uL — ABNORMAL HIGH (ref 1.7–7.7)
Neutrophils Relative %: 67 %
Platelet Count: 190 10*3/uL (ref 150–400)
RBC: 3.31 MIL/uL — ABNORMAL LOW (ref 3.87–5.11)
RDW: 13.7 % (ref 11.5–15.5)
WBC Count: 13 10*3/uL — ABNORMAL HIGH (ref 4.0–10.5)
nRBC: 0 % (ref 0.0–0.2)

## 2023-04-16 MED ORDER — SODIUM CHLORIDE 0.9 % IV SOLN
75.0000 mg/m2 | Freq: Once | INTRAVENOUS | Status: AC
  Administered 2023-04-16: 114 mg via INTRAVENOUS
  Filled 2023-04-16: qty 11.4

## 2023-04-16 MED ORDER — SODIUM CHLORIDE 0.9% FLUSH
10.0000 mL | Freq: Once | INTRAVENOUS | Status: AC
  Administered 2023-04-16: 10 mL

## 2023-04-16 MED ORDER — ONDANSETRON HCL 4 MG/2ML IJ SOLN
8.0000 mg | Freq: Once | INTRAMUSCULAR | Status: AC
  Administered 2023-04-16: 8 mg via INTRAVENOUS
  Filled 2023-04-16: qty 4

## 2023-04-16 MED ORDER — SODIUM CHLORIDE 0.9 % IV SOLN: Freq: Once | INTRAVENOUS | Status: AC

## 2023-04-16 NOTE — Progress Notes (Signed)
[facility]   [phone] [phone]   Clinic Follow up Note   Patient Care Team: Maurice Small, MD as PCP - General (Family Medicine) [name], Deloris Ping, MD as PCP - Cardiology (Cardiology) Zenovia Jordan, MD as Consulting Physician (Rheumatology) Malachy Mood, MD as Consulting Physician (Hematology) Charna Elizabeth, MD as Consulting Physician (Gastroenterology) Josephine Igo, DO as Consulting Physician (Pulmonary Disease)  Date of Service:  04/16/2023  CHIEF COMPLAINT: f/u of CMML    CURRENT THERAPY:  MYELODYSPLASIA Azacitidine IV D1-7 q 6 weeks  ASSESSMENT:  [name] is a 77 y.o. female with   CMML (chronic myelomonocytic leukemia) (HCC) -diagnosed in 10/2021 ---She developed worsening back pain, pelvic MRI 07/04/21 showed a mass in the presacral space. 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 Her BM biopsy was reviewed at [facility] and was felt to be CMML-1 with 5% blasts.  -She began azacitadine on 02/06/22. she has had multiple hospital admissions and her performance status has been very low.  Changed her chemo to every 6 weeks in 08/2022 due to her fatigue. -she is tolerating every 6 weeks much better, feels better and able to do all her ADL now -she saw Dr. Sharyne Richters in Feb 2024, who recommends to continue current therapy  -Lab reviewed, adequate for treatment, will proceed cycle 7 Vidaza -Continue to monitor her blood counts closely -Follow-up in 6 weeks for cycle 8 Vidaza -She is clinically doing well, has more energy lately, and is able to function better at home.  No new complaints.   PLAN: -Lab reviewed, adequate for treatment, will proceed with cycle 7 azacitidine today -Lab in 3 weeks -Lab and follow-up in 6 weeks before cycle 8 Vidaza   SUMMARY OF  ONCOLOGIC HISTORY: Oncology History  CMML (chronic myelomonocytic leukemia)  11/09/2021 Initial Biopsy   DIAGNOSIS:   -  Monoclonal B-cell population with co-expression of CD5 comprises 17%  of all lymphocytes  -  See comment   COMMENT:  In addition to the clonal B-cell population, there is a myeloblast  population (CD34, CD38, HLA-DR, CD117, CD123 and CD33) that comprises 2% of the total cellular events.  Please see concurrent tissue biopsy (below) for additional work-up and final diagnosis.    FINAL MICROSCOPIC DIAGNOSIS:   A. SOFT TISSUE MASS, PRE SACRAL, NEEDLE CORE BIOPSY:  -  Chronic lymphocytic leukemia/small lymphocytic lymphoma  -  Extra medullary hematopoiesis  -  See comment   COMMENT:  The biopsy consists of multiple soft tissue cores with lymphoid nodules and a dense hematopoietic infiltrate consistent with extra medullary hematopoiesis.  MPO and E-cadherin highlight myeloid and erythroid precursors respectively.  CD34 highlights increased vasculature and is also positive within the cytoplasm of megakaryocytes.  A few small, immature mononuclear cells appear to be positive for CD34 and CD117. TdT shows rare, scattered positive cells.  CD20 highlights aggregates of B cells which are admixed with CD3 positive T cells.  T cells are an admixture of CD4 and CD8.  The B cells are also positive for CD5, CD23 and Bcl-2.  The B cells do not show significant staining for CD10, BCL6 or cyclin D1.  CD138 highlights scattered plasma cells which are polytypic by kappa and lambda in situ hybridization.  Flow cytometry performed on the sample (see WL [id number]) identified a kappa restricted CD5 positive B-cell population comprising 70% of lymphocytes.  In addition, a small myeloblast population comprised 2% of the total cellular events.   Overall, the findings are consistent with soft tissue involvement by  chronic lymphocytic leukemia/small lymphocytic lymphoma and extra medullary  hematopoiesis. In reviewing the patient's CBC data (macrocytic anemia and thrombocytopenia), I would recommend a bone marrow biopsy to assess for marrow involvement by CLL/SLL.    12/23/2021 Imaging   EXAM: CT CHEST, ABDOMEN, AND PELVIS WITH CONTRAST  IMPRESSION: 1. Slight interval enlargement of a presacral soft tissue mass measuring 7.2 x 4.7 cm, previously 6.9 x 4.1 cm on prior MR of the pelvis dated 07/04/2021. By report, this represents a biopsy proven lymphoma. 2. Pleural nodule of the dependent right lower lobe overlying the posterior right tenth rib and pleural or paraspinous soft tissue mass overlying the right aspect of the T10 vertebral body, very slightly enlarged compared to prior examination of the chest dated 06/25/2020, consistent with additional sites of lymphomatous involvement given very indolent growth. These could be better assessed for metabolic activity by FDG PET/CT if desired. 3. There is mild, bibasilar predominant pulmonary fibrosis in a pattern featuring irregular peripheral interstitial opacity, septal thickening, but without clear evidence of subpleural bronchiolectasis or honeycombing, with a somewhat asymmetric distribution most conspicuously involving the right lower lobe and lingula. These findings are significantly worsened when compared to prior examination dated 06/25/2020, particularly in the right lower lobe. Given interval change, this may reflect sequelae of interval infection or aspiration, however appearance is generally suspicious for fibrotic interstitial lung disease, and if characterized by ATS pulmonary fibrosis is in an "indeterminate for UIP" pattern, differential considerations including both UIP and NSIP. 4. Emphysema.   Aortic Atherosclerosis (ICD10-I70.0) and Emphysema (ICD10-J43.9).   01/05/2022 Pathology Results   DIAGNOSIS:   BONE MARROW, ASPIRATE, CLOT, CORE:  -Hypercellular bone marrow for age with features of   myelodysplastic/myeloproliferative neoplasm  -Minor abnormal B-cell population  -See comment   PERIPHERAL BLOOD:  -Macrocytic anemia  -Neutrophilic left shift and monocytosis  -Thrombocytopenia   COMMENT:  The bone marrow is hypercellular for age with dyspoietic changes  involving myeloid cell lines associated with monocytosis and increased number of blastic cells (12%) as primarily seen by morphology, many of which display monocytic features.  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  In this background, there are several predominantly small lymphoid aggregates mostly composed of small lymphoid cells.  By flow cytometry, a minor abnormal B-cell population expressing CD5 is seen and representing 2% of all cells.  This correlate with previously known B-cell lymphoproliferative process.  Correlation with cytogenetic and FISH studies is strongly recommended.    DIAGNOSIS:   -Increased number of monocytic cells present (25%)  -Minor abnormal B-cell population identified.  -See comment   COMMENT:  Flow cytometric analysis shows increased number of monocytic cells representing 25% of all cells but without aberrant phenotype or CD34 expression.  A significant CD34-positive blastic population is not identified.  The lymphoid population shows a minor B-cell population representing 2% of all cells and expressing B-cell antigens including CD20 associated with CD5, CD200 and possibly dim kappa expression.  The latter findings are abnormal and correlate with previously known B-cell lymphoproliferative process.  No significant T-cell phenotypic abnormalities identified.    01/12/2022 Initial Diagnosis   CMML (chronic myelomonocytic leukemia) (HCC)   01/12/2022 Cancer Staging   Staging form: Chronic Myeloid Leukemia, AJCC 8th  Edition - Clinical  stage from 01/12/2022: Bone marrow blast count (%): 12, Additional clonal changes: Unknown - Signed by Malachy Mood, MD on 01/12/2022 Stage prefix: Initial diagnosis   02/06/2022 - 08/11/2022 Chemotherapy   Patient is on Treatment Plan : MYELODYSPLASIA  Azacitidine IV D1-7 q28d     02/06/2022 -  Chemotherapy   Patient is on Treatment Plan : MYELODYSPLASIA  Azacitidine IV D1-7 q28d        INTERVAL HISTORY:  [name] is here for a follow up of CMML . She was last seen by me on 03/05/2023. She presents to the clinic with her husband. She is clinically doing better. She has more energy, and able to function better than before.  Her low back pain is stable, no other new complaints.  She denies any fever, bleeding, or recent episodes of infection.    All other systems were reviewed with the patient and are negative.  MEDICAL HISTORY:  Past Medical History:  Diagnosis Date   Arthritis    Rheumatoid arthritis   Celiac disease    Chronic kidney disease    stage 3 from MD notes   COPD (chronic obstructive pulmonary disease)    Dyspnea    with going up stairs   Family history of adverse reaction to anesthesia    father had hard time waking up   Headache    sinus headaches   Hot flashes    Hypertension    Iron deficiency anemia    Pneumonia    per patient "I have walking pneumonia"    SURGICAL HISTORY: Past Surgical History:  Procedure Laterality Date   COLONOSCOPY     ECTOPIC PREGNANCY SURGERY      x 2   IR IMAGING GUIDED PORT INSERTION  01/31/2022   REVERSE SHOULDER ARTHROPLASTY Right 02/02/2017   Procedure: RIGHT REVERSE SHOULDER ARTHROPLASTY;  Surgeon: Beverely Low, MD;  Location: [facility] OR;  Service: Orthopedics;  Laterality: Right;    I have reviewed the social history and family history with the patient and they are unchanged from previous note.  ALLERGIES:  is allergic to digoxin and related, gluten meal, penicillins, fexofenadine, alendronate, and  hydroxychloroquine.  MEDICATIONS:  Current Outpatient Medications  Medication Sig Dispense Refill   denosumab (PROLIA) 60 MG/ML SOSY injection Inject 60 mg into the skin every 6 (six) months.     diltiazem (CARDIZEM CD) 240 MG 24 hr capsule TAKE 1 CAPSULE(240 MG) BY MOUTH DAILY 30 capsule 11   furosemide (LASIX) 20 MG tablet Take 1 tablet (20 mg total) by mouth daily. 30 tablet 11   gabapentin (NEURONTIN) 300 MG capsule Take 300 mg by mouth in the morning.     Metoprolol Tartrate 75 MG TABS Take 150 mg by mouth 2 (two) times daily. 360 tablet 3   potassium chloride SA (KLOR-CON M20) 20 MEQ tablet Take 1 tablet (20 mEq total) by mouth daily. Take with furosemide 90 tablet 3   predniSONE (DELTASONE) 5 MG tablet Take 5 mg by mouth daily with breakfast.     STIOLTO RESPIMAT 2.5-2.5 MCG/ACT AERS INHALE 2 PUFFS INTO THE LUNGS DAILY 4 g 5   No current facility-administered medications for this visit.   Facility-Administered Medications Ordered in Other Visits  Medication Dose Route Frequency Provider Last Rate Last Admin   acetaminophen (TYLENOL) 325 MG tablet            diphenhydrAMINE (BENADRYL) 25 mg capsule             PHYSICAL EXAMINATION: ECOG  PERFORMANCE STATUS: 2 - Symptomatic, <50% confined to bed  Vitals:   04/16/23 1224  BP: (!) 112/59  Pulse: 67  Resp: 18  Temp: 97.8 F (36.6 C)  SpO2: 98%   Wt Readings from Last 3 Encounters:  04/16/23 106 lb (48.1 kg)  03/27/23 105 lb 3.2 oz (47.7 kg)  03/05/23 107 lb 9.6 oz (48.8 kg)     GENERAL:alert, no distress and comfortable SKIN: skin color, texture, turgor are normal, no rashes or significant lesions EYES: normal, Conjunctiva are pink and non-injected, sclera clear NECK: supple, thyroid normal size, non-tender, without nodularity LYMPH:  no palpable lymphadenopathy in the cervical, axillary  LUNGS: clear to auscultation and percussion with normal breathing effort HEART: regular rate & rhythm and no murmurs and no lower  extremity edema ABDOMEN:abdomen soft, non-tender and normal bowel sounds Musculoskeletal:no cyanosis of digits and no clubbing  NEURO: alert & oriented x 3 with fluent speech, no focal motor/sensory deficits  LABORATORY DATA:  I have reviewed the data as listed    Latest Ref Rng & Units 04/16/2023   11:59 AM 03/26/2023    2:36 PM 03/05/2023   11:44 AM  CBC  WBC 4.0 - 10.5 K/uL 13.0  8.5  9.5   Hemoglobin 12.0 - 15.0 g/dL 16.1  09.6  04.5   Hematocrit 36.0 - 46.0 % 33.5  33.6  34.4   Platelets 150 - 400 K/uL 190  107  171         Latest Ref Rng & Units 04/16/2023   11:59 AM 03/26/2023    2:36 PM 03/05/2023   11:44 AM  CMP  Glucose 70 - 99 mg/dL 409  811  914   BUN 8 - 23 mg/dL Creatinine 0.44 - 1.00 mg/dL 7.82  9.56  2.13   Sodium 135 - 145 mmol/L 135  136  137   Potassium 3.5 - 5.1 mmol/L 4.9  4.0  4.3   Chloride 98 - 111 mmol/L 100  101  102   CO2 22 - 32 mmol/L Calcium 8.9 - 10.3 mg/dL 9.7  08.6  9.2   Total Protein 6.5 - 8.1 g/dL  6.5  6.2   Total Bilirubin 0.3 - 1.2 mg/dL  0.7  0.6   Alkaline Phos 38 - 126 U/L  44  46   AST 15 - 41 U/L  20  22   ALT 0 - 44 U/L  22  17       RADIOGRAPHIC STUDIES: I have personally reviewed the radiological images as listed and agreed with the findings in the report. No results found.    Orders Placed This Encounter  Procedures   CBC with Differential (Cancer Center Only)    Standing Status:   Future    Standing Expiration Date:   05/27/2024   Basic Metabolic Panel - Cancer Center Only    Standing Status:   Future    Standing Expiration Date:   05/27/2024   All questions were answered. The patient knows to call the clinic with any problems, questions or concerns. No barriers to learning was detected. The total time spent in the appointment was 25 minutes.     Malachy Mood, MD 04/16/2023   Carolin Coy, CMA, am acting as scribe for Malachy Mood, MD.   I have reviewed the above documentation for accuracy  and completeness, and I agree with the above.

## 2023-04-16 NOTE — Telephone Encounter (Signed)
Called and spoke with patient. She was due for 6 month follow-up appt in October, I'm assuming that's what the letter would have beens since I don't see anything else in the chart. Pt agrees. Scheduled for 09/28/23 @ 1:20.

## 2023-04-16 NOTE — Progress Notes (Signed)
Nutrition  RD planning to follow-up with patient during infusion.  Received message from MD saying that patient states she does not need to see RD today as she has gained some weight back.  Appointment cancelled.  Please re-consult if needed in the future.  Kailynn Satterly B. Freida Busman, RD, LDN Registered Dietitian [phone]

## 2023-04-17 ENCOUNTER — Inpatient Hospital Stay: Payer: Medicare Other

## 2023-04-17 ENCOUNTER — Other Ambulatory Visit: Payer: Self-pay

## 2023-04-17 VITALS — BP 122/54 | HR 71 | Temp 97.6°F | Resp 16

## 2023-04-17 DIAGNOSIS — C9310 Chronic myelomonocytic leukemia not having achieved remission: Secondary | ICD-10-CM

## 2023-04-17 DIAGNOSIS — Z79899 Other long term (current) drug therapy: Secondary | ICD-10-CM | POA: Diagnosis not present

## 2023-04-17 DIAGNOSIS — Z5111 Encounter for antineoplastic chemotherapy: Secondary | ICD-10-CM | POA: Diagnosis not present

## 2023-04-17 MED ORDER — SODIUM CHLORIDE 0.9 % IV SOLN: Freq: Once | INTRAVENOUS | Status: AC

## 2023-04-17 MED ORDER — HEPARIN SOD (PORK) LOCK FLUSH 100 UNIT/ML IV SOLN
500.0000 [IU] | Freq: Once | INTRAVENOUS | Status: AC | PRN
  Administered 2023-04-17: 500 [IU]

## 2023-04-17 MED ORDER — SODIUM CHLORIDE 0.9% FLUSH
10.0000 mL | INTRAVENOUS | Status: DC | PRN
  Administered 2023-04-17: 10 mL

## 2023-04-17 MED ORDER — ONDANSETRON HCL 4 MG/2ML IJ SOLN
8.0000 mg | Freq: Once | INTRAMUSCULAR | Status: AC
  Administered 2023-04-17: 8 mg via INTRAVENOUS
  Filled 2023-04-17: qty 4

## 2023-04-17 MED ORDER — SODIUM CHLORIDE 0.9 % IV SOLN
75.0000 mg/m2 | Freq: Once | INTRAVENOUS | Status: AC
  Administered 2023-04-17: 114 mg via INTRAVENOUS
  Filled 2023-04-17: qty 11.4

## 2023-04-17 NOTE — Patient Instructions (Signed)
[facility]  Discharge Instructions: Thank you for choosing [facility] to provide your oncology and hematology care.   If you have a lab appointment with the Cancer Center, please go directly to the Cancer Center and check in at the registration area.   Wear comfortable clothing and clothing appropriate for easy access to any Portacath or PICC line.   We strive to give you quality time with your provider. You may need to reschedule your appointment if you arrive late (15 or more minutes).  Arriving late affects you and other patients whose appointments are after yours.  Also, if you miss three or more appointments without notifying the office, you may be dismissed from the clinic at the provider's discretion.      For prescription refill requests, have your pharmacy contact our office and allow 72 hours for refills to be completed.    Today you received the following chemotherapy and/or immunotherapy agents: Vidaza     To help prevent nausea and vomiting after your treatment, we encourage you to take your nausea medication as directed.  BELOW ARE SYMPTOMS THAT SHOULD BE REPORTED IMMEDIATELY: *FEVER GREATER THAN 100.4 F (38 C) OR HIGHER *CHILLS OR SWEATING *NAUSEA AND VOMITING THAT IS NOT CONTROLLED WITH YOUR NAUSEA MEDICATION *UNUSUAL SHORTNESS OF BREATH *UNUSUAL BRUISING OR BLEEDING *URINARY PROBLEMS (pain or burning when urinating, or frequent urination) *BOWEL PROBLEMS (unusual diarrhea, constipation, pain near the anus) TENDERNESS IN MOUTH AND THROAT WITH OR WITHOUT PRESENCE OF ULCERS (sore throat, sores in mouth, or a toothache) UNUSUAL RASH, SWELLING OR PAIN  UNUSUAL VAGINAL DISCHARGE OR ITCHING   Items with * indicate a potential emergency and should be followed up as soon as possible or go to the Emergency Department if any problems should occur.  Please show the CHEMOTHERAPY ALERT CARD or IMMUNOTHERAPY ALERT CARD at check-in  to the Emergency Department and triage nurse.  Should you have questions after your visit or need to cancel or reschedule your appointment, please contact [facility]  Dept: [phone]  and follow the prompts.  Office hours are 8:00 a.m. to 4:30 p.m. Monday - Friday. Please note that voicemails left after 4:00 p.m. may not be returned until the following business day.  We are closed weekends and major holidays. You have access to a nurse at all times for urgent questions. Please call the main number to the clinic Dept: [phone] and follow the prompts.   For any non-urgent questions, you may also contact your provider using MyChart. We now offer e-Visits for anyone 18 and older to request care online for non-urgent symptoms. For details visit [email or web address].   Also download the MyChart app! Go to the app store, search "MyChart", open the app, select [facility], and log in with your MyChart username and password.  

## 2023-04-18 ENCOUNTER — Inpatient Hospital Stay: Payer: Medicare Other

## 2023-04-18 VITALS — BP 150/71 | HR 68 | Temp 97.7°F | Resp 16

## 2023-04-18 DIAGNOSIS — C9310 Chronic myelomonocytic leukemia not having achieved remission: Secondary | ICD-10-CM

## 2023-04-18 DIAGNOSIS — Z5111 Encounter for antineoplastic chemotherapy: Secondary | ICD-10-CM | POA: Diagnosis not present

## 2023-04-18 DIAGNOSIS — Z79899 Other long term (current) drug therapy: Secondary | ICD-10-CM | POA: Diagnosis not present

## 2023-04-18 MED ORDER — HEPARIN SOD (PORK) LOCK FLUSH 100 UNIT/ML IV SOLN: 500.0000 [IU] | Freq: Once | INTRAVENOUS | Status: DC | PRN

## 2023-04-18 MED ORDER — SODIUM CHLORIDE 0.9 % IV SOLN
75.0000 mg/m2 | Freq: Once | INTRAVENOUS | Status: AC
  Administered 2023-04-18: 114 mg via INTRAVENOUS
  Filled 2023-04-18: qty 11.4

## 2023-04-18 MED ORDER — SODIUM CHLORIDE 0.9% FLUSH: 10.0000 mL | INTRAVENOUS | Status: DC | PRN

## 2023-04-18 MED ORDER — SODIUM CHLORIDE 0.9 % IV SOLN: Freq: Once | INTRAVENOUS | Status: AC

## 2023-04-18 MED ORDER — ONDANSETRON HCL 4 MG/2ML IJ SOLN
8.0000 mg | Freq: Once | INTRAMUSCULAR | Status: AC
  Administered 2023-04-18: 8 mg via INTRAVENOUS
  Filled 2023-04-18: qty 4

## 2023-04-19 ENCOUNTER — Other Ambulatory Visit: Payer: Self-pay

## 2023-04-19 ENCOUNTER — Inpatient Hospital Stay: Payer: Medicare Other

## 2023-04-19 VITALS — BP 155/67 | HR 63 | Temp 97.6°F | Resp 17 | Ht 63.0 in | Wt 107.5 lb

## 2023-04-19 DIAGNOSIS — Z5111 Encounter for antineoplastic chemotherapy: Secondary | ICD-10-CM | POA: Diagnosis not present

## 2023-04-19 DIAGNOSIS — C9310 Chronic myelomonocytic leukemia not having achieved remission: Secondary | ICD-10-CM

## 2023-04-19 DIAGNOSIS — Z79899 Other long term (current) drug therapy: Secondary | ICD-10-CM | POA: Diagnosis not present

## 2023-04-19 MED ORDER — HEPARIN SOD (PORK) LOCK FLUSH 100 UNIT/ML IV SOLN
500.0000 [IU] | Freq: Once | INTRAVENOUS | Status: AC | PRN
  Administered 2023-04-19: 500 [IU]

## 2023-04-19 MED ORDER — SODIUM CHLORIDE 0.9 % IV SOLN: Freq: Once | INTRAVENOUS | Status: AC

## 2023-04-19 MED ORDER — SODIUM CHLORIDE 0.9% FLUSH
10.0000 mL | INTRAVENOUS | Status: DC | PRN
  Administered 2023-04-19: 10 mL

## 2023-04-19 MED ORDER — SODIUM CHLORIDE 0.9 % IV SOLN
75.0000 mg/m2 | Freq: Once | INTRAVENOUS | Status: AC
  Administered 2023-04-19: 114 mg via INTRAVENOUS
  Filled 2023-04-19: qty 11.4

## 2023-04-19 MED ORDER — ONDANSETRON HCL 4 MG/2ML IJ SOLN
8.0000 mg | Freq: Once | INTRAMUSCULAR | Status: AC
  Administered 2023-04-19: 8 mg via INTRAVENOUS
  Filled 2023-04-19: qty 4

## 2023-04-19 NOTE — Patient Instructions (Signed)
[facility]  Discharge Instructions: Thank you for choosing [facility] to provide your oncology and hematology care.   If you have a lab appointment with the Cancer Center, please go directly to the Cancer Center and check in at the registration area.   Wear comfortable clothing and clothing appropriate for easy access to any Portacath or PICC line.   We strive to give you quality time with your provider. You may need to reschedule your appointment if you arrive late (15 or more minutes).  Arriving late affects you and other patients whose appointments are after yours.  Also, if you miss three or more appointments without notifying the office, you may be dismissed from the clinic at the provider's discretion.      For prescription refill requests, have your pharmacy contact our office and allow 72 hours for refills to be completed.    Today you received the following chemotherapy and/or immunotherapy agent: Vidaza    To help prevent nausea and vomiting after your treatment, we encourage you to take your nausea medication as directed.  BELOW ARE SYMPTOMS THAT SHOULD BE REPORTED IMMEDIATELY: *FEVER GREATER THAN 100.4 F (38 C) OR HIGHER *CHILLS OR SWEATING *NAUSEA AND VOMITING THAT IS NOT CONTROLLED WITH YOUR NAUSEA MEDICATION *UNUSUAL SHORTNESS OF BREATH *UNUSUAL BRUISING OR BLEEDING *URINARY PROBLEMS (pain or burning when urinating, or frequent urination) *BOWEL PROBLEMS (unusual diarrhea, constipation, pain near the anus) TENDERNESS IN MOUTH AND THROAT WITH OR WITHOUT PRESENCE OF ULCERS (sore throat, sores in mouth, or a toothache) UNUSUAL RASH, SWELLING OR PAIN  UNUSUAL VAGINAL DISCHARGE OR ITCHING   Items with * indicate a potential emergency and should be followed up as soon as possible or go to the Emergency Department if any problems should occur.  Please show the CHEMOTHERAPY ALERT CARD or IMMUNOTHERAPY ALERT CARD at check-in to  the Emergency Department and triage nurse.  Should you have questions after your visit or need to cancel or reschedule your appointment, please contact [facility]  Dept: [phone]  and follow the prompts.  Office hours are 8:00 a.m. to 4:30 p.m. Monday - Friday. Please note that voicemails left after 4:00 p.m. may not be returned until the following business day.  We are closed weekends and major holidays. You have access to a nurse at all times for urgent questions. Please call the main number to the clinic Dept: [phone] and follow the prompts.   For any non-urgent questions, you may also contact your provider using MyChart. We now offer e-Visits for anyone 16 and older to request care online for non-urgent symptoms. For details visit [email or web address].   Also download the MyChart app! Go to the app store, search "MyChart", open the app, select [facility], and log in with your MyChart username and password.  Azacitidine Injection What is this medication? AZACITIDINE (ay za SITE i deen) treats blood and bone marrow cancers. It works by slowing down the growth of cancer cells. This medicine may be used for other purposes; ask your health care provider or pharmacist if you have questions. COMMON BRAND NAME(S): Vidaza What should I tell my care team before I take this medication? They need to know if you have any of these conditions: Kidney disease Liver disease Low blood cell levels, such as low white cells, platelets, or red blood cells Low levels of albumin in the blood Low levels of bicarbonate in the blood An unusual  or allergic reaction to azacitidine, mannitol, other medications, foods, dyes, or preservatives If you or your partner are pregnant or trying to get pregnant Breast-feeding How should I use this medication? This medication is injected into a vein or under the skin. It is given by your care team in a hospital or clinic  setting. Talk to your care team about the use of this medication in children. While it may be prescribed for children as young as 1 month for selected conditions, precautions do apply. Overdosage: If you think you have taken too much of this medicine contact a poison control center or emergency room at once. NOTE: This medicine is only for you. Do not share this medicine with others. What if I miss a dose? Keep appointments for follow-up doses. It is important not to miss your dose. Call your care team if you are unable to keep an appointment. What may interact with this medication? Interactions are not expected. This list may not describe all possible interactions. Give your health care provider a list of all the medicines, herbs, non-prescription drugs, or dietary supplements you use. Also tell them if you smoke, drink alcohol, or use illegal drugs. Some items may interact with your medicine. What should I watch for while using this medication? Your condition will be monitored carefully while you are receiving this medication. You may need blood work while taking this medication. This medication may make you feel generally unwell. This is not uncommon as chemotherapy can affect healthy cells as well as cancer cells. Report any side effects. Continue your course of treatment even though you feel ill unless your care team tells you to stop. Other product types may be available that contain the medication azacitidine. The injection and oral products should not be used in place of one another. Talk to your care team if you have questions. This medication can cause serious side effects. To reduce the risk, your care team may give you other medications to take before receiving this one. Be sure to follow the directions from your care team. This medication may increase your risk of getting an infection. Call your care team for advice if you get a fever, chills, sore throat, or other symptoms of a cold or  flu. Do not treat yourself. Try to avoid being around people who are sick. Avoid taking medications that contain aspirin, acetaminophen, ibuprofen, naproxen, or ketoprofen unless instructed by your care team. These medications may hide a fever. This medication may increase your risk to bruise or bleed. Call your care team if you notice any unusual bleeding. Be careful brushing or flossing your teeth or using a toothpick because you may get an infection or bleed more easily. If you have any dental work done, tell your dentist you are receiving this medication. Talk to your care team if you or your partner may be pregnant. Serious birth defects can occur if you take this medication during pregnancy and for 6 months after the last dose. You will need a negative pregnancy test before starting this medication. Contraception is recommended while taking his medication and for 6 months after the last dose. Your care team can help you find the option that works for you. If your partner can get pregnant, use a condom during sex while taking this medication and for 3 months after the last dose. Do not breastfeed while taking this medication and for 1 week after the last dose. This medication may cause infertility. Talk to your care team if  you are concerned about your fertility. What side effects may I notice from receiving this medication? Side effects that you should report to your care team as soon as possible: Allergic reactions--skin rash, itching, hives, swelling of the face, lips, tongue, or throat Infection--fever, chills, cough, sore throat, wounds that don't heal, pain or trouble when passing urine, general feeling of discomfort or being unwell Kidney injury--decrease in the amount of urine, swelling of the ankles, hands, or feet Liver injury--right upper belly pain, loss of appetite, nausea, light-colored stool, dark yellow or brown urine, yellowing skin or eyes, unusual weakness or fatigue Low red  blood cell level--unusual weakness or fatigue, dizziness, headache, trouble breathing Tumor lysis syndrome (TLS)--nausea, vomiting, diarrhea, decrease in the amount of urine, dark urine, unusual weakness or fatigue, confusion, muscle pain or cramps, fast or irregular heartbeat, joint pain Unusual bruising or bleeding Side effects that usually do not require medical attention (report to your care team if they continue or are bothersome): Constipation Diarrhea Nausea Pain, redness, or irritation at injection site Vomiting This list may not describe all possible side effects. Call your doctor for medical advice about side effects. You may report side effects to FDA at 1-800-FDA-1088. Where should I keep my medication? This medication is given in a hospital or clinic. It will not be stored at home. NOTE: This sheet is a summary. It may not cover all possible information. If you have questions about this medicine, talk to your doctor, pharmacist, or health care provider.  2023 Elsevier/Gold Standard (2022-04-27 00:00:00)

## 2023-04-20 ENCOUNTER — Inpatient Hospital Stay: Payer: Medicare Other

## 2023-04-20 VITALS — BP 144/58 | HR 66 | Temp 97.8°F | Resp 18

## 2023-04-20 DIAGNOSIS — Z5111 Encounter for antineoplastic chemotherapy: Secondary | ICD-10-CM | POA: Diagnosis not present

## 2023-04-20 DIAGNOSIS — Z79899 Other long term (current) drug therapy: Secondary | ICD-10-CM | POA: Diagnosis not present

## 2023-04-20 DIAGNOSIS — C9310 Chronic myelomonocytic leukemia not having achieved remission: Secondary | ICD-10-CM | POA: Diagnosis not present

## 2023-04-20 MED ORDER — SODIUM CHLORIDE 0.9% FLUSH
10.0000 mL | INTRAVENOUS | Status: DC | PRN
  Administered 2023-04-20: 10 mL

## 2023-04-20 MED ORDER — SODIUM CHLORIDE 0.9 % IV SOLN: Freq: Once | INTRAVENOUS | Status: AC

## 2023-04-20 MED ORDER — HEPARIN SOD (PORK) LOCK FLUSH 100 UNIT/ML IV SOLN
500.0000 [IU] | Freq: Once | INTRAVENOUS | Status: AC | PRN
  Administered 2023-04-20: 500 [IU]

## 2023-04-20 MED ORDER — SODIUM CHLORIDE 0.9 % IV SOLN
75.0000 mg/m2 | Freq: Once | INTRAVENOUS | Status: AC
  Administered 2023-04-20: 114 mg via INTRAVENOUS
  Filled 2023-04-20: qty 11.4

## 2023-04-20 MED ORDER — ONDANSETRON HCL 4 MG/2ML IJ SOLN
8.0000 mg | Freq: Once | INTRAMUSCULAR | Status: AC
  Administered 2023-04-20: 8 mg via INTRAVENOUS
  Filled 2023-04-20: qty 4

## 2023-04-20 NOTE — Patient Instructions (Signed)
[facility]  Discharge Instructions: Thank you for choosing [facility] to provide your oncology and hematology care.   If you have a lab appointment with the Cancer Center, please go directly to the Cancer Center and check in at the registration area.   Wear comfortable clothing and clothing appropriate for easy access to any Portacath or PICC line.   We strive to give you quality time with your provider. You may need to reschedule your appointment if you arrive late (15 or more minutes).  Arriving late affects you and other patients whose appointments are after yours.  Also, if you miss three or more appointments without notifying the office, you may be dismissed from the clinic at the provider's discretion.      For prescription refill requests, have your pharmacy contact our office and allow 72 hours for refills to be completed.    Today you received the following chemotherapy and/or immunotherapy agents vidaza       To help prevent nausea and vomiting after your treatment, we encourage you to take your nausea medication as directed.  BELOW ARE SYMPTOMS THAT SHOULD BE REPORTED IMMEDIATELY: *FEVER GREATER THAN 100.4 F (38 C) OR HIGHER *CHILLS OR SWEATING *NAUSEA AND VOMITING THAT IS NOT CONTROLLED WITH YOUR NAUSEA MEDICATION *UNUSUAL SHORTNESS OF BREATH *UNUSUAL BRUISING OR BLEEDING *URINARY PROBLEMS (pain or burning when urinating, or frequent urination) *BOWEL PROBLEMS (unusual diarrhea, constipation, pain near the anus) TENDERNESS IN MOUTH AND THROAT WITH OR WITHOUT PRESENCE OF ULCERS (sore throat, sores in mouth, or a toothache) UNUSUAL RASH, SWELLING OR PAIN  UNUSUAL VAGINAL DISCHARGE OR ITCHING   Items with * indicate a potential emergency and should be followed up as soon as possible or go to the Emergency Department if any problems should occur.  Please show the CHEMOTHERAPY ALERT CARD or IMMUNOTHERAPY ALERT CARD at  check-in to the Emergency Department and triage nurse.  Should you have questions after your visit or need to cancel or reschedule your appointment, please contact [facility]  Dept: [phone]  and follow the prompts.  Office hours are 8:00 a.m. to 4:30 p.m. Monday - Friday. Please note that voicemails left after 4:00 p.m. may not be returned until the following business day.  We are closed weekends and major holidays. You have access to a nurse at all times for urgent questions. Please call the main number to the clinic Dept: [phone] and follow the prompts.   For any non-urgent questions, you may also contact your provider using MyChart. We now offer e-Visits for anyone 18 and older to request care online for non-urgent symptoms. For details visit [email or web address].   Also download the MyChart app! Go to the app store, search "MyChart", open the app, select [place], and log in with your MyChart username and password.   

## 2023-04-23 ENCOUNTER — Ambulatory Visit (INDEPENDENT_AMBULATORY_CARE_PROVIDER_SITE_OTHER): Payer: Medicare Other | Admitting: Pulmonary Disease

## 2023-04-23 ENCOUNTER — Encounter: Payer: Self-pay | Admitting: Pulmonary Disease

## 2023-04-23 VITALS — BP 110/66 | HR 60 | Ht 62.0 in | Wt 107.2 lb

## 2023-04-23 DIAGNOSIS — C9310 Chronic myelomonocytic leukemia not having achieved remission: Secondary | ICD-10-CM

## 2023-04-23 DIAGNOSIS — J189 Pneumonia, unspecified organism: Secondary | ICD-10-CM | POA: Diagnosis not present

## 2023-04-23 DIAGNOSIS — J449 Chronic obstructive pulmonary disease, unspecified: Secondary | ICD-10-CM | POA: Diagnosis not present

## 2023-04-23 MED ORDER — STIOLTO RESPIMAT 2.5-2.5 MCG/ACT IN AERS: 2.0000 | INHALATION_SPRAY | Freq: Every day | RESPIRATORY_TRACT | 11 refills | Status: DC

## 2023-04-23 NOTE — Progress Notes (Signed)
Synopsis: Referred in August 2020 for shortness of breath by Maurice Small, MD  Subjective:   PATIENT ID: [name] GENDER: female DOB: [birth year], MRN: [id number]  Chief Complaint  Patient presents with   Follow-up    6 mos F/up    This is a 77 year old female past medical history of rheumatoid arthritis + remicade q8weeks, celiac disease, CKD stage III, former smoker quit 15 years ago, 30+-pack-year history of smoking, COPD Moderate GOLD 2, 77% predicted, 1.7L.  Lung RADS 4 a on most recent low-dose lung cancer screening.  Patient was scheduled to follow-up with me regarding abnormal lung nodule.  Follows with [facility].  OV 08/06/2019: she has been smoking for many years, quit 16 years ago. She stopped 2 times in her life. She quit the first time in her 30s. She was enrolled LDCT and doing well.  She is concerned because she watched her sister have continued decline from COPD.  She also saw her mother died from COPD.  She is not been on any maintenance medications before.  She is only been using as needed short acting Combivent.  She does have exertional dyspnea.  She does try to exercise regularly.  She feels some shortness of breath with exercise but otherwise doing well.  OV 11/12/2019: Low-dose lung cancer screening CT completed in October.  Lung nodule followed in the lingula has resolved. Overall, she is doing well today. Breathlessness is stable.  Overall doing well today.  She does feels her breathing is stable.  Today in the office we reviewed her CT imaging.  Her joints in her hands tend to bother her towards the last few weeks before its time to get her next Remicade injection.  She is currently taking prednisone 5 mg every other day.  She has been working with her rheumatologist to try to come off of this if at all possible.  Currently doing well with her inhaler regimen.  And glad that she was able to switch.  Husband is currently out of town on a  fishing/hunting trip in the [place].  OV 02/25/2020: Patient here today for follow-up after recent HRCT imaging.  Multiple small pulmonary nodules stable evidence of centrilobular and paraseptal emphysema.  No evidence of ILD.  Did review images today in the office.  Patient currently managed with Stiolto.  Breathing well at this time.  She does complain of thoracic back pain shoulder pain arm pain hand pain all the which is chronic for her and related to her prior rheumatoid arthritis history.  She still is receiving her Remicade infusions.  She did receive both of her COVID-19 vaccinations.  OV 03/10/2022: Here today for follow-up.  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 Mosetta Putt.  From a respiratory standpoint she is doing okay.  But she does feel short of breath with exertion.  OV 09/27/2022: Here today for follow-up.  Last seen in the office in March.  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  Currently undergoing chemotherapy treatments.  She is also receiving infliximab infusions with rheumatology.  Initially was seen for lung nodules.  She does have a 30+ pack year history of smoking.  She had a CT scan of the chest completed in June 2023.  Was reviewed today in the office.She had numerous right-sided pleural-based metastasis seen in April 2023 CT chest.  She had a right sided paraspinal soft tissue mass at the level of T10.  OV 04/23/2023: Here today for follow-up.  Currently under treatments for CMML from medical oncology.  She also has infliximab infusions from rheumatology.  Was initially seen with some pulmonary nodules.  She does have a 30+ pack year history of smoking.  Currently on Stiolto.  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      Past Medical History:  Diagnosis Date   Arthritis    Rheumatoid arthritis   Celiac disease    Chronic  kidney disease    stage 3 from MD notes   COPD (chronic obstructive pulmonary disease) (HCC)    Dyspnea    with going up stairs   Family history of adverse reaction to anesthesia    father had hard time waking up   Headache    sinus headaches   Hot flashes    Hypertension    Iron deficiency anemia    Pneumonia    per patient "I have walking pneumonia"     Family History  Problem Relation Age of Onset   Peripheral Artery Disease Father    Diabetes Father      Past Surgical History:  Procedure Laterality Date   COLONOSCOPY     ECTOPIC PREGNANCY SURGERY      x 2   IR IMAGING GUIDED PORT INSERTION  01/31/2022   REVERSE SHOULDER ARTHROPLASTY Right 02/02/2017   Procedure: RIGHT REVERSE SHOULDER ARTHROPLASTY;  Surgeon: Beverely Low, MD;  Location: [facility] OR;  Service: Orthopedics;  Laterality: Right;    Social History   Socioeconomic History   Marital status: Married    Spouse name: Not on file   Number of children: Not on file   Years of education: Not on file   Highest education level: Not on file  Occupational History   Not on file  Tobacco Use   Smoking status: Former    Packs/day: 1.00    Years: 30.00    Additional pack years: 0.00    Total pack years: 30.00    Types: Cigarettes    Quit date: 2005    Years since quitting: 19.3   Smokeless tobacco: Never  Vaping Use   Vaping Use: Never used  Substance and Sexual Activity   Alcohol use: Yes    Alcohol/week: 12.0 - 14.0 standard drinks of alcohol    Types: 12 - 14 Glasses of wine per week   Drug use: No   Sexual activity: Not Currently  Other Topics Concern   Not on file  Social History Narrative   Not on file   Social Determinants of Health   Financial Resource Strain: Not on file  Food Insecurity: Not on file  Transportation Needs: Not on file  Physical Activity: Not on file  Stress: Not on file  Social Connections: Not on file  Intimate Partner Violence: Not on file     Allergies  Allergen Reactions    Digoxin And Related Rash    Generalized total body rash with immense itching   Gluten Meal Diarrhea   Penicillins Rash and Other (See Comments)      Did it involve swelling of the face/tongue/throat, SOB, or low BP? No Did it involve sudden or severe rash/hives, skin peeling, or any reaction on the inside of your mouth or nose? Yes Did you need to seek medical attention at a hospital or doctor's office? No When did it last happen?    "Many Years Ago"  If  all above answers are "NO", may proceed with cephalosporin use.      Fexofenadine     Other Reaction(s): dry mouth   Alendronate Rash   Hydroxychloroquine Rash     Outpatient Medications Prior to Visit  Medication Sig Dispense Refill   denosumab (PROLIA) 60 MG/ML SOSY injection Inject 60 mg into the skin every 6 (six) months.     diltiazem (CARDIZEM CD) 240 MG 24 hr capsule TAKE 1 CAPSULE(240 MG) BY MOUTH DAILY 30 capsule 11   furosemide (LASIX) 20 MG tablet Take 1 tablet (20 mg total) by mouth daily. 30 tablet 11   gabapentin (NEURONTIN) 300 MG capsule Take 300 mg by mouth in the morning.     Metoprolol Tartrate 75 MG TABS Take 150 mg by mouth 2 (two) times daily. 360 tablet 3   potassium chloride SA (KLOR-CON M20) 20 MEQ tablet Take 1 tablet (20 mEq total) by mouth daily. Take with furosemide 90 tablet 3   predniSONE (DELTASONE) 5 MG tablet Take 5 mg by mouth daily with breakfast.     STIOLTO RESPIMAT 2.5-2.5 MCG/ACT AERS INHALE 2 PUFFS INTO THE LUNGS DAILY 4 g 5   Facility-Administered Medications Prior to Visit  Medication Dose Route Frequency Provider Last Rate Last Admin   acetaminophen (TYLENOL) 325 MG tablet            diphenhydrAMINE (BENADRYL) 25 mg capsule             Review of Systems  Constitutional:  Positive for malaise/fatigue. Negative for chills, fever and weight loss.  HENT:  Negative for hearing loss, sore throat and tinnitus.   Eyes:  Negative for blurred vision and double vision.  Respiratory:   Positive for shortness of breath. Negative for cough, hemoptysis, sputum production, wheezing and stridor.   Cardiovascular:  Negative for chest pain, palpitations, orthopnea, leg swelling and PND.  Gastrointestinal:  Negative for abdominal pain, constipation, diarrhea, heartburn, nausea and vomiting.  Genitourinary:  Negative for dysuria, hematuria and urgency.  Musculoskeletal:  Negative for joint pain and myalgias.  Skin:  Negative for itching and rash.  Neurological:  Negative for dizziness, tingling, weakness and headaches.  Endo/Heme/Allergies:  Negative for environmental allergies. Does not bruise/bleed easily.  Psychiatric/Behavioral:  Negative for depression. The patient is not nervous/anxious and does not have insomnia.   All other systems reviewed and are negative.    Objective:  Physical Exam Vitals reviewed.  Constitutional:      General: She is not in acute distress.    Appearance: She is well-developed.  HENT:     Head: Normocephalic and atraumatic.     Mouth/Throat:     Pharynx: No oropharyngeal exudate.  Eyes:     Conjunctiva/sclera: Conjunctivae normal.     Pupils: Pupils are equal, round, and reactive to light.  Neck:     Vascular: No JVD.     Trachea: No tracheal deviation.     Comments: Loss of supraclavicular fat Cardiovascular:     Rate and Rhythm: Normal rate and regular rhythm.     Heart sounds: S1 normal and S2 normal.     Comments: Distant heart tones Pulmonary:     Effort: No tachypnea or accessory muscle usage.     Breath sounds: No stridor. Decreased breath sounds (throughout all lung fields) present. No wheezing, rhonchi or rales.     Comments: Clear bilaterally  Abdominal:     General: There is no distension.     Palpations: Abdomen is soft.  Tenderness: There is no abdominal tenderness.  Musculoskeletal:        General: Deformity (muscle wasting ) present.  Skin:    General: Skin is warm and dry.     Capillary Refill: Capillary refill  takes less than 2 seconds.     Findings: No rash.  Neurological:     Mental Status: She is alert and oriented to person, place, and time.  Psychiatric:        Behavior: Behavior normal.      Vitals:   04/23/23 1033  BP: 110/66  Pulse: 60  SpO2: 98%  Weight: 107 lb 3.2 oz (48.6 kg)  Height: 5\' 2"  (1.575 m)   98% on RA BMI Readings from Last 3 Encounters:  04/23/23 19.61 kg/m  04/19/23 19.04 kg/m  04/16/23 19.39 kg/m   Wt Readings from Last 3 Encounters:  04/23/23 107 lb 3.2 oz (48.6 kg)  04/19/23 107 lb 8 oz (48.8 kg)  04/16/23 106 lb (48.1 kg)     CBC    Component Value Date/Time   WBC 13.0 (H) 04/16/2023 1159   WBC 6.0 01/09/2023 1203   RBC 3.31 (L) 04/16/2023 1159   HGB 11.3 (L) 04/16/2023 1159   HGB 10.8 (L) 01/03/2017 1246   HCT 33.5 (L) 04/16/2023 1159   HCT 32.8 (L) 01/03/2017 1246   PLT 190 04/16/2023 1159   PLT 163 01/03/2017 1246   MCV 101.2 (H) 04/16/2023 1159   MCV 102.8 (H) 01/03/2017 1246   MCH 34.1 (H) 04/16/2023 1159   MCHC 33.7 04/16/2023 1159   RDW 13.7 04/16/2023 1159   RDW 14.3 01/03/2017 1246   LYMPHSABS 1.5 04/16/2023 1159   LYMPHSABS 2.9 01/03/2017 1246   MONOABS 1.9 (H) 04/16/2023 1159   MONOABS 1.1 (H) 01/03/2017 1246   EOSABS 0.4 04/16/2023 1159   EOSABS 0.1 01/03/2017 1246   BASOSABS 0.2 (H) 04/16/2023 1159   BASOSABS 0.0 01/03/2017 1246    Chest Imaging: 07/07/2019: CT lung cancer screening: Lung RADS 4 a, right lower lobe patchy opacities.  Significant emphysema.  10/08/2019 low-dose lung cancer screening CT: Lung RADS 2, inferior lingular nodule resolved.  Now quit greater than 15 years no additional need for low-dose lung cancer screening.  Septal thickening in the bases concerning for potential ILD.  02/20/2020 CT chest: Multiple small pulmonary nodules 5 mm or less in size.  Stable from previous imaging evidence of mild centrilobular and paraseptal emphysema. The patient's images have been independently reviewed by me.     12/23/2021 CT chest abdomen pelvis: Interval enlargement in the 7.2 cm soft tissue presacral mass.  Pleural-based nodule. Also with a paraspinous soft tissue mass overlying the right aspect of the T10 vertebral body. The patient's images have been independently reviewed by me.    April 2023 CT chest: Pleural-based lesions as well as T10 paraspinal soft tissue lesion. The patient's images have been independently reviewed by me.     Pulmonary Functions Testing Results:    Latest Ref Rng & Units 05/03/2021    1:55 PM 01/26/2016    3:31 PM  PFT Results  FVC-Pre L 2.19  2.31   FVC-Predicted Pre % 80  79   FVC-Post L 2.21  2.45   FVC-Predicted Post % 80  84   Pre FEV1/FVC % % 68  69   Post FEV1/FCV % % 68  69   FEV1-Pre L 1.49  1.60   FEV1-Predicted Pre % 72  72   FEV1-Post L 1.51  1.70  DLCO uncorrected ml/min/mmHg 10.27  12.99   DLCO UNC% % 55  56   DLCO corrected ml/min/mmHg 10.27    DLCO COR %Predicted % 55    DLVA Predicted % 72  70   TLC L 4.39  4.25   TLC % Predicted % 89  86   RV % Predicted % 100  84     FeNO: None   Pathology: None  Echocardiogram: None   Heart Catheterization: None     Assessment & Plan:     ICD-10-CM   1. Community acquired pneumonia of right upper lobe of lung  J18.9     2. Multifocal pneumonia  J18.9     3. Stage 2 moderate COPD by GOLD classification (HCC)  J44.9     4. 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  She had recent HRCT imaging that showed an enlarged presacral mass and paraspinal soft tissue lesion ultimately diagnosed with CMML.  You are currently undergoing treatments with medical oncology.  She has stage II COPD on Stiolto.  Plan: Continue Stiolto Can follow-up with primary care in the future regarding inhaler management.  I would not put her  on a ICS unless she had increased symptomatology.  Mainly doing to be immune suppressed and try to avoid steroids in the setting which may increase her risk for community-acquired pneumonia. She can return to clinic to see [place] in 1 year or as needed.  Continue albuterol as needed.     Current Outpatient Medications:    denosumab (PROLIA) 60 MG/ML SOSY injection, Inject 60 mg into the skin every 6 (six) months., Disp: , Rfl:    diltiazem (CARDIZEM CD) 240 MG 24 hr capsule, TAKE 1 CAPSULE(240 MG) BY MOUTH DAILY, Disp: 30 capsule, Rfl: 11   furosemide (LASIX) 20 MG tablet, Take 1 tablet (20 mg total) by mouth daily., Disp: 30 tablet, Rfl: 11   gabapentin (NEURONTIN) 300 MG capsule, Take 300 mg by mouth in the morning., Disp: , Rfl:    Metoprolol Tartrate 75 MG TABS, Take 150 mg by mouth 2 (two) times daily., Disp: 360 tablet, Rfl: 3   potassium chloride SA (KLOR-CON M20) 20 MEQ tablet, Take 1 tablet (20 mEq total) by mouth daily. Take with furosemide, Disp: 90 tablet, Rfl: 3   predniSONE (DELTASONE) 5 MG tablet, Take 5 mg by mouth daily with breakfast., Disp: , Rfl:    Tiotropium Bromide-Olodaterol (STIOLTO RESPIMAT) 2.5-2.5 MCG/ACT AERS, Inhale 2 puffs into the lungs daily., Disp: 4 g, Rfl: 11 No current facility-administered medications for this visit.  Facility-Administered Medications Ordered in Other Visits:    acetaminophen (TYLENOL) 325 MG tablet, , , ,    diphenhydrAMINE (BENADRYL) 25 mg capsule, , , ,    Josephine Igo, DO [facility] 04/23/2023 10:56 AM

## 2023-04-23 NOTE — Patient Instructions (Signed)
Thank you for visiting Dr. Tonia Brooms at [facility]. Today we recommend the following:  Meds ordered this encounter  Medications   Tiotropium Bromide-Olodaterol (STIOLTO RESPIMAT) 2.5-2.5 MCG/ACT AERS    Sig: Inhale 2 puffs into the lungs daily.    Dispense:  4 g    Refill:  11   Return in about 1 year (around 04/22/2024), or if symptoms worsen or fail to improve.    Please do your part to reduce the spread of COVID-19.

## 2023-05-08 ENCOUNTER — Ambulatory Visit: Payer: Medicare Other | Admitting: Nurse Practitioner

## 2023-05-08 ENCOUNTER — Inpatient Hospital Stay: Payer: Medicare Other | Attending: Nurse Practitioner

## 2023-05-08 DIAGNOSIS — C9310 Chronic myelomonocytic leukemia not having achieved remission: Secondary | ICD-10-CM | POA: Insufficient documentation

## 2023-05-08 DIAGNOSIS — Z95828 Presence of other vascular implants and grafts: Secondary | ICD-10-CM

## 2023-05-08 LAB — CMP (CANCER CENTER ONLY)
ALT: 18 U/L (ref 0–44)
AST: 20 U/L (ref 15–41)
Albumin: 4.4 g/dL (ref 3.5–5.0)
Alkaline Phosphatase: 44 U/L (ref 38–126)
Anion gap: 9 (ref 5–15)
BUN: 26 mg/dL — ABNORMAL HIGH (ref 8–23)
CO2: 26 mmol/L (ref 22–32)
Calcium: 9.8 mg/dL (ref 8.9–10.3)
Chloride: 100 mmol/L (ref 98–111)
Creatinine: 0.92 mg/dL (ref 0.44–1.00)
GFR, Estimated: >60 mL/min (ref >60)
Glucose, Bld: 111 mg/dL — ABNORMAL HIGH (ref 70–99)
Potassium: 4.4 mmol/L (ref 3.5–5.1)
Sodium: 135 mmol/L (ref 135–145)
Total Bilirubin: 0.7 mg/dL (ref 0.3–1.2)
Total Protein: 6.6 g/dL (ref 6.5–8.1)

## 2023-05-08 LAB — CBC WITH DIFFERENTIAL (CANCER CENTER ONLY)
Abs Immature Granulocytes: 0.04 10*3/uL (ref 0.00–0.07)
Basophils Absolute: 0 10*3/uL (ref 0.0–0.1)
Basophils Relative: 1 %
Eosinophils Absolute: 0 10*3/uL (ref 0.0–0.5)
Eosinophils Relative: 0 %
HCT: 31.4 % — ABNORMAL LOW (ref 36.0–46.0)
Hemoglobin: 10.6 g/dL — ABNORMAL LOW (ref 12.0–15.0)
Immature Granulocytes: 1 %
Lymphocytes Relative: 16 %
Lymphs Abs: 1.4 10*3/uL (ref 0.7–4.0)
MCH: 34.6 pg — ABNORMAL HIGH (ref 26.0–34.0)
MCHC: 33.8 g/dL (ref 30.0–36.0)
MCV: 102.6 fL — ABNORMAL HIGH (ref 80.0–100.0)
Monocytes Absolute: 1.1 10*3/uL — ABNORMAL HIGH (ref 0.1–1.0)
Monocytes Relative: 12 %
Neutro Abs: 6 10*3/uL (ref 1.7–7.7)
Neutrophils Relative %: 70 %
Platelet Count: 94 10*3/uL — ABNORMAL LOW (ref 150–400)
RBC: 3.06 MIL/uL — ABNORMAL LOW (ref 3.87–5.11)
RDW: 15.5 % (ref 11.5–15.5)
WBC Count: 8.5 10*3/uL (ref 4.0–10.5)
nRBC: 0 % (ref 0.0–0.2)

## 2023-05-08 MED ORDER — SODIUM CHLORIDE 0.9% FLUSH
10.0000 mL | Freq: Once | INTRAVENOUS | Status: AC
  Administered 2023-05-08: 10 mL

## 2023-05-08 MED ORDER — HEPARIN SOD (PORK) LOCK FLUSH 100 UNIT/ML IV SOLN
500.0000 [IU] | Freq: Once | INTRAVENOUS | Status: AC
  Administered 2023-05-08: 500 [IU]

## 2023-05-11 ENCOUNTER — Encounter: Payer: Self-pay | Admitting: Hematology

## 2023-05-15 ENCOUNTER — Telehealth: Payer: Self-pay

## 2023-05-15 NOTE — Telephone Encounter (Signed)
Called patient as per Dr. Mosetta Putt to see if she is still having severe pack pain. Patient stated that she thinks she may have slept wrong and she is feeling better. So she doesn't feel she needs to be seen but will let us know if she has any further issues.  Please call pt to see if she still has severe back pain, if yes, please let her come in to see me or [facility] if Lacie's schedule is full), and we will decide if she needs MRI to evaluate, thanks  Malachy Mood

## 2023-05-22 ENCOUNTER — Other Ambulatory Visit: Payer: Self-pay | Admitting: *Deleted

## 2023-05-22 DIAGNOSIS — M0579 Rheumatoid arthritis with rheumatoid factor of multiple sites without organ or systems involvement: Secondary | ICD-10-CM | POA: Diagnosis not present

## 2023-05-22 DIAGNOSIS — Z79899 Other long term (current) drug therapy: Secondary | ICD-10-CM | POA: Diagnosis not present

## 2023-05-22 DIAGNOSIS — R5383 Other fatigue: Secondary | ICD-10-CM | POA: Diagnosis not present

## 2023-05-22 DIAGNOSIS — Z111 Encounter for screening for respiratory tuberculosis: Secondary | ICD-10-CM | POA: Diagnosis not present

## 2023-05-22 MED ORDER — POTASSIUM CHLORIDE CRYS ER 20 MEQ PO TBCR: 20.0000 meq | EXTENDED_RELEASE_TABLET | Freq: Every day | ORAL | 3 refills | Status: DC

## 2023-05-27 NOTE — Assessment & Plan Note (Signed)
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  -She began azacitadine on 02/06/22. she has had multiple hospital admissions and her performance status has been very low.  Changed her chemo to every 6 weeks in 08/2022 due to her fatigue. -she is tolerating every 6 weeks much better, feels better and able to do all her ADL now -she saw Dr. Sharyne Richters in Feb 2024, who recommends to continue current therapy  -Lab reviewed, adequate for treatment, will proceed cycle 7 Vidaza -Continue to monitor her blood counts closely -Follow-up in 6 weeks for cycle 8 Vidaza -She is clinically doing well, has more energy lately, and is able to function better at home.  No new complaints.

## 2023-05-28 ENCOUNTER — Other Ambulatory Visit: Payer: Self-pay

## 2023-05-28 ENCOUNTER — Inpatient Hospital Stay: Payer: Medicare Other | Attending: Nurse Practitioner

## 2023-05-28 ENCOUNTER — Encounter: Payer: Self-pay | Admitting: Hematology

## 2023-05-28 ENCOUNTER — Inpatient Hospital Stay: Payer: Medicare Other

## 2023-05-28 ENCOUNTER — Inpatient Hospital Stay (HOSPITAL_BASED_OUTPATIENT_CLINIC_OR_DEPARTMENT_OTHER): Payer: Medicare Other | Admitting: Hematology

## 2023-05-28 VITALS — BP 128/67 | HR 74 | Resp 16

## 2023-05-28 VITALS — BP 125/87 | HR 90 | Temp 97.6°F | Resp 18 | Ht 62.0 in | Wt 108.0 lb

## 2023-05-28 DIAGNOSIS — Z95828 Presence of other vascular implants and grafts: Secondary | ICD-10-CM

## 2023-05-28 DIAGNOSIS — C9310 Chronic myelomonocytic leukemia not having achieved remission: Secondary | ICD-10-CM | POA: Diagnosis not present

## 2023-05-28 DIAGNOSIS — S92302G Fracture of unspecified metatarsal bone(s), left foot, subsequent encounter for fracture with delayed healing: Secondary | ICD-10-CM

## 2023-05-28 DIAGNOSIS — Z5111 Encounter for antineoplastic chemotherapy: Secondary | ICD-10-CM | POA: Insufficient documentation

## 2023-05-28 LAB — CBC WITH DIFFERENTIAL (CANCER CENTER ONLY)
Abs Immature Granulocytes: 0.2 10*3/uL — ABNORMAL HIGH (ref 0.00–0.07)
Basophils Absolute: 0.2 10*3/uL — ABNORMAL HIGH (ref 0.0–0.1)
Basophils Relative: 2 %
Eosinophils Absolute: 0.3 10*3/uL (ref 0.0–0.5)
Eosinophils Relative: 2 %
HCT: 33.8 % — ABNORMAL LOW (ref 36.0–46.0)
Hemoglobin: 11.5 g/dL — ABNORMAL LOW (ref 12.0–15.0)
Immature Granulocytes: 2 %
Lymphocytes Relative: 17 %
Lymphs Abs: 2.1 10*3/uL (ref 0.7–4.0)
MCH: 34.4 pg — ABNORMAL HIGH (ref 26.0–34.0)
MCHC: 34 g/dL (ref 30.0–36.0)
MCV: 101.2 fL — ABNORMAL HIGH (ref 80.0–100.0)
Monocytes Absolute: 2.1 10*3/uL — ABNORMAL HIGH (ref 0.1–1.0)
Monocytes Relative: 17 %
Neutro Abs: 7.6 10*3/uL (ref 1.7–7.7)
Neutrophils Relative %: 60 %
Platelet Count: 211 10*3/uL (ref 150–400)
RBC: 3.34 MIL/uL — ABNORMAL LOW (ref 3.87–5.11)
RDW: 14.4 % (ref 11.5–15.5)
WBC Count: 12.3 10*3/uL — ABNORMAL HIGH (ref 4.0–10.5)
nRBC: 0 % (ref 0.0–0.2)

## 2023-05-28 LAB — CMP (CANCER CENTER ONLY)
ALT: 19 U/L (ref 0–44)
AST: 23 U/L (ref 15–41)
Albumin: 4.5 g/dL (ref 3.5–5.0)
Alkaline Phosphatase: 54 U/L (ref 38–126)
Anion gap: 11 (ref 5–15)
BUN: 34 mg/dL — ABNORMAL HIGH (ref 8–23)
CO2: 27 mmol/L (ref 22–32)
Calcium: 10.4 mg/dL — ABNORMAL HIGH (ref 8.9–10.3)
Chloride: 97 mmol/L — ABNORMAL LOW (ref 98–111)
Creatinine: 1 mg/dL (ref 0.44–1.00)
GFR, Estimated: 58 mL/min — ABNORMAL LOW (ref >60)
Glucose, Bld: 126 mg/dL — ABNORMAL HIGH (ref 70–99)
Potassium: 4.5 mmol/L (ref 3.5–5.1)
Sodium: 135 mmol/L (ref 135–145)
Total Bilirubin: 0.7 mg/dL (ref 0.3–1.2)
Total Protein: 7 g/dL (ref 6.5–8.1)

## 2023-05-28 MED ORDER — SODIUM CHLORIDE 0.9% FLUSH
10.0000 mL | INTRAVENOUS | Status: DC | PRN
  Administered 2023-05-28: 10 mL

## 2023-05-28 MED ORDER — ACETAMINOPHEN-CODEINE 300-30 MG PO TABS: 1.0000 | ORAL_TABLET | Freq: Four times a day (QID) | ORAL | 0 refills | Status: DC | PRN

## 2023-05-28 MED ORDER — SODIUM CHLORIDE 0.9 % IV SOLN: Freq: Once | INTRAVENOUS | Status: AC

## 2023-05-28 MED ORDER — ONDANSETRON HCL 4 MG/2ML IJ SOLN
8.0000 mg | Freq: Once | INTRAMUSCULAR | Status: AC
  Administered 2023-05-28: 8 mg via INTRAVENOUS
  Filled 2023-05-28: qty 4

## 2023-05-28 MED ORDER — HEPARIN SOD (PORK) LOCK FLUSH 100 UNIT/ML IV SOLN
500.0000 [IU] | Freq: Once | INTRAVENOUS | Status: AC | PRN
  Administered 2023-05-28: 500 [IU]

## 2023-05-28 MED ORDER — SODIUM CHLORIDE 0.9 % IV SOLN
75.0000 mg/m2 | Freq: Once | INTRAVENOUS | Status: AC
  Administered 2023-05-28: 114 mg via INTRAVENOUS
  Filled 2023-05-28: qty 11.4

## 2023-05-28 MED ORDER — SODIUM CHLORIDE 0.9% FLUSH
10.0000 mL | Freq: Once | INTRAVENOUS | Status: AC
  Administered 2023-05-28: 10 mL

## 2023-05-28 NOTE — Patient Instructions (Signed)
[facility]  Discharge Instructions: Thank you for choosing [facility] to provide your oncology and hematology care.   If you have a lab appointment with the Cancer Center, please go directly to the Cancer Center and check in at the registration area.   Wear comfortable clothing and clothing appropriate for easy access to any Portacath or PICC line.   We strive to give you quality time with your provider. You may need to reschedule your appointment if you arrive late (15 or more minutes).  Arriving late affects you and other patients whose appointments are after yours.  Also, if you miss three or more appointments without notifying the office, you may be dismissed from the clinic at the provider's discretion.      For prescription refill requests, have your pharmacy contact our office and allow 72 hours for refills to be completed.    Today you received the following chemotherapy and/or immunotherapy agents: Vidaza     To help prevent nausea and vomiting after your treatment, we encourage you to take your nausea medication as directed.  BELOW ARE SYMPTOMS THAT SHOULD BE REPORTED IMMEDIATELY: *FEVER GREATER THAN 100.4 F (38 C) OR HIGHER *CHILLS OR SWEATING *NAUSEA AND VOMITING THAT IS NOT CONTROLLED WITH YOUR NAUSEA MEDICATION *UNUSUAL SHORTNESS OF BREATH *UNUSUAL BRUISING OR BLEEDING *URINARY PROBLEMS (pain or burning when urinating, or frequent urination) *BOWEL PROBLEMS (unusual diarrhea, constipation, pain near the anus) TENDERNESS IN MOUTH AND THROAT WITH OR WITHOUT PRESENCE OF ULCERS (sore throat, sores in mouth, or a toothache) UNUSUAL RASH, SWELLING OR PAIN  UNUSUAL VAGINAL DISCHARGE OR ITCHING   Items with * indicate a potential emergency and should be followed up as soon as possible or go to the Emergency Department if any problems should occur.  Please show the CHEMOTHERAPY ALERT CARD or IMMUNOTHERAPY ALERT CARD at check-in  to the Emergency Department and triage nurse.  Should you have questions after your visit or need to cancel or reschedule your appointment, please contact [facility]  Dept: [phone]  and follow the prompts.  Office hours are 8:00 a.m. to 4:30 p.m. Monday - Friday. Please note that voicemails left after 4:00 p.m. may not be returned until the following business day.  We are closed weekends and major holidays. You have access to a nurse at all times for urgent questions. Please call the main number to the clinic Dept: [phone] and follow the prompts.   For any non-urgent questions, you may also contact your provider using MyChart. We now offer e-Visits for anyone 18 and older to request care online for non-urgent symptoms. For details visit [email or web address].   Also download the MyChart app! Go to the app store, search "MyChart", open the app, select [facility], and log in with your MyChart username and password.  

## 2023-05-28 NOTE — Progress Notes (Signed)
[facility]   [phone] [phone]) [phone]   Clinic Follow up Note   Patient Care Team: Maurice Small, MD (Inactive) as PCP - General (Family Medicine) [name], Deloris Ping, MD as PCP - Cardiology (Cardiology) Zenovia Jordan, MD as Consulting Physician (Rheumatology) Malachy Mood, MD as Consulting Physician (Hematology) Charna Elizabeth, MD as Consulting Physician (Gastroenterology) Josephine Igo, DO as Consulting Physician (Pulmonary Disease)  Date of Service:  05/28/2023  CHIEF COMPLAINT: f/u of CMML     CURRENT THERAPY:  MYELODYSPLASIA Azacitidine IV D1-7 q 6 weeks   ASSESSMENT:  [name] is a 77 y.o. female with    CMML (chronic myelomonocytic leukemia) (HCC) -diagnosed in 10/2021 ---She developed worsening back pain, pelvic MRI 07/04/21 showed a mass in the presacral space. 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 Her BM biopsy was reviewed at [facility] and was felt to be CMML-1 with 5% blasts.  -She began azacitadine on 02/06/22. she has had multiple hospital admissions and her performance status has been very low.  Changed her chemo to every 6 weeks in 08/2022 due to her fatigue. -she is tolerating every 6 weeks much better, feels better and able to do all her ADL now -she saw Dr. Sharyne Richters in Feb 2024, who recommends to continue current therapy  -Lab reviewed, adequate for treatment, will proceed cycle 7 Vidaza -Continue to monitor her blood counts closely -Follow-up in 6 weeks for cycle 8 Vidaza -She is overall stable, her husband noticed worsening low back pain, will repeat lumbar MRI for evaluation -Lab reviewed, adequate for treatment, will proceed by today's injection today     PLAN: - MRI next week - f/u after MRI - proceed to C8 treatment today and daily for next 4  days       SUMMARY OF ONCOLOGIC HISTORY: Oncology History  CMML (chronic myelomonocytic leukemia) (HCC)  11/09/2021 Initial Biopsy   DIAGNOSIS:   -  Monoclonal B-cell population with co-expression of CD5 comprises 17%  of all lymphocytes  -  See comment   COMMENT:  In addition to the clonal B-cell population, there is a myeloblast  population (CD34, CD38, HLA-DR, CD117, CD123 and CD33) that comprises 2% of the total cellular events.  Please see concurrent tissue biopsy (below) for additional work-up and final diagnosis.    FINAL MICROSCOPIC DIAGNOSIS:   A. SOFT TISSUE MASS, PRE SACRAL, NEEDLE CORE BIOPSY:  -  Chronic lymphocytic leukemia/small lymphocytic lymphoma  -  Extra medullary hematopoiesis  -  See comment   COMMENT:  The biopsy consists of multiple soft tissue cores with lymphoid nodules and a dense hematopoietic infiltrate consistent with extra medullary hematopoiesis.  MPO and E-cadherin highlight myeloid and erythroid precursors respectively.  CD34 highlights increased vasculature and is also positive within the cytoplasm of megakaryocytes.  A few small, immature mononuclear cells appear to be positive for CD34 and CD117. TdT shows rare, scattered positive cells.  CD20 highlights aggregates of B cells which are admixed with CD3 positive T cells.  T cells are an admixture of CD4 and CD8.  The B cells are also positive for CD5, CD23 and Bcl-2.  The B cells do not show significant staining for CD10, BCL6 or cyclin D1.  CD138 highlights scattered plasma cells which are polytypic by kappa and lambda in situ hybridization.  Flow cytometry performed on the sample (see WL [id number]) identified  a kappa restricted CD5 positive B-cell population comprising 70% of lymphocytes.  In addition, a small myeloblast population comprised 2% of the total cellular events.   Overall, the findings are consistent with soft tissue involvement by  chronic lymphocytic leukemia/small lymphocytic  lymphoma and extra medullary hematopoiesis. In reviewing the patient's CBC data (macrocytic anemia and thrombocytopenia), I would recommend a bone marrow biopsy to assess for marrow involvement by CLL/SLL.    12/23/2021 Imaging   EXAM: CT CHEST, ABDOMEN, AND PELVIS WITH CONTRAST  IMPRESSION: 1. Slight interval enlargement of a presacral soft tissue mass measuring 7.2 x 4.7 cm, previously 6.9 x 4.1 cm on prior MR of the pelvis dated 07/04/2021. By report, this represents a biopsy proven lymphoma. 2. Pleural nodule of the dependent right lower lobe overlying the posterior right tenth rib and pleural or paraspinous soft tissue mass overlying the right aspect of the T10 vertebral body, very slightly enlarged compared to prior examination of the chest dated 06/25/2020, consistent with additional sites of lymphomatous involvement given very indolent growth. These could be better assessed for metabolic activity by FDG PET/CT if desired. 3. There is mild, bibasilar predominant pulmonary fibrosis in a pattern featuring irregular peripheral interstitial opacity, septal thickening, but without clear evidence of subpleural bronchiolectasis or honeycombing, with a somewhat asymmetric distribution most conspicuously involving the right lower lobe and lingula. These findings are significantly worsened when compared to prior examination dated 06/25/2020, particularly in the right lower lobe. Given interval change, this may reflect sequelae of interval infection or aspiration, however appearance is generally suspicious for fibrotic interstitial lung disease, and if characterized by ATS pulmonary fibrosis is in an "indeterminate for UIP" pattern, differential considerations including both UIP and NSIP. 4. Emphysema.   Aortic Atherosclerosis (ICD10-I70.0) and Emphysema (ICD10-J43.9).   01/05/2022 Pathology Results   DIAGNOSIS:   BONE MARROW, ASPIRATE, CLOT, CORE:  -Hypercellular bone marrow for age with  features of  myelodysplastic/myeloproliferative neoplasm  -Minor abnormal B-cell population  -See comment   PERIPHERAL BLOOD:  -Macrocytic anemia  -Neutrophilic left shift and monocytosis  -Thrombocytopenia   COMMENT:  The bone marrow is hypercellular for age with dyspoietic changes  involving myeloid cell lines associated with monocytosis and increased number of blastic cells (12%) as primarily seen by morphology, many of which display monocytic features.  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  In this background, there are several predominantly small lymphoid aggregates mostly composed of small lymphoid cells.  By flow cytometry, a minor abnormal B-cell population expressing CD5 is seen and representing 2% of all cells.  This correlate with previously known B-cell lymphoproliferative process.  Correlation with cytogenetic and FISH studies is strongly recommended.    DIAGNOSIS:   -Increased number of monocytic cells present (25%)  -Minor abnormal B-cell population identified.  -See comment   COMMENT:  Flow cytometric analysis shows increased number of monocytic cells representing 25% of all cells but without aberrant phenotype or CD34 expression.  A significant CD34-positive blastic population is not identified.  The lymphoid population shows a minor B-cell population representing 2% of all cells and expressing B-cell antigens including CD20 associated with CD5, CD200 and possibly dim kappa expression.  The latter findings are abnormal and correlate with previously known B-cell lymphoproliferative process.  No significant T-cell phenotypic abnormalities identified.    01/12/2022 Initial Diagnosis   CMML (chronic myelomonocytic leukemia) (HCC)   01/12/2022 Cancer Staging  Staging form: Chronic Myeloid Leukemia,  AJCC 8th Edition - Clinical stage from 01/12/2022: Bone marrow blast count (%): 12, Additional clonal changes: Unknown - Signed by Malachy Mood, MD on 01/12/2022 Stage prefix: Initial diagnosis   02/06/2022 - 08/11/2022 Chemotherapy   Patient is on Treatment Plan : MYELODYSPLASIA  Azacitidine IV D1-7 q28d     02/06/2022 -  Chemotherapy   Patient is on Treatment Plan : MYELODYSPLASIA  Azacitidine IV D1-7 q28d        INTERVAL HISTORY:  LARRIE HAFFNER is here for a follow up of  CMML.   She was last seen by Dr. Malachy Mood on 03/05/2023. She presents to the clinic with family member. Patient states she is doing ok, she says she is week up to 4 weeks after treatment. Patient states she is still having back pain that comes and goes.     MEDICAL HISTORY:  Past Medical History:  Diagnosis Date   Arthritis    Rheumatoid arthritis   Celiac disease    Chronic kidney disease    stage 3 from MD notes   COPD (chronic obstructive pulmonary disease) (HCC)    Dyspnea    with going up stairs   Family history of adverse reaction to anesthesia    father had hard time waking up   Headache    sinus headaches   Hot flashes    Hypertension    Iron deficiency anemia    Pneumonia    per patient "I have walking pneumonia"    SURGICAL HISTORY: Past Surgical History:  Procedure Laterality Date   COLONOSCOPY     ECTOPIC PREGNANCY SURGERY      x 2   IR IMAGING GUIDED PORT INSERTION  01/31/2022   REVERSE SHOULDER ARTHROPLASTY Right 02/02/2017   Procedure: RIGHT REVERSE SHOULDER ARTHROPLASTY;  Surgeon: Beverely Low, MD;  Location: [facility] OR;  Service: Orthopedics;  Laterality: Right;    I have reviewed the social history and family history with the patient and they are unchanged from previous note.  ALLERGIES:  is allergic to digoxin and related, gluten meal, penicillins, fexofenadine, alendronate, and hydroxychloroquine.  MEDICATIONS:  Current Outpatient Medications  Medication Sig Dispense Refill    acetaminophen-codeine (TYLENOL #3) 300-30 MG tablet Take 1 tablet by mouth every 6 (six) hours as needed for moderate pain. 20 tablet 0   denosumab (PROLIA) 60 MG/ML SOSY injection Inject 60 mg into the skin every 6 (six) months.     diltiazem (CARDIZEM CD) 240 MG 24 hr capsule TAKE 1 CAPSULE(240 MG) BY MOUTH DAILY 30 capsule 11   furosemide (LASIX) 20 MG tablet Take 1 tablet (20 mg total) by mouth daily. 30 tablet 11   gabapentin (NEURONTIN) 300 MG capsule Take 300 mg by mouth in the morning.     Metoprolol Tartrate 75 MG TABS Take 150 mg by mouth 2 (two) times daily. 360 tablet 3   potassium chloride SA (KLOR-CON M20) 20 MEQ tablet Take 1 tablet (20 mEq total) by mouth daily. Take with furosemide 90 tablet 3   predniSONE (DELTASONE) 5 MG tablet Take 5 mg by mouth daily with breakfast.     Tiotropium Bromide-Olodaterol (STIOLTO RESPIMAT) 2.5-2.5 MCG/ACT AERS Inhale 2 puffs into the lungs daily. 4 g 11   No current facility-administered medications for this visit.   Facility-Administered Medications Ordered in Other Visits  Medication Dose Route Frequency Provider Last Rate Last Admin   acetaminophen (TYLENOL) 325 MG tablet  diphenhydrAMINE (BENADRYL) 25 mg capsule            sodium chloride flush (NS) 0.9 % injection 10 mL  10 mL Intracatheter PRN Malachy Mood, MD   10 mL at 05/28/23 1414    PHYSICAL EXAMINATION: ECOG PERFORMANCE STATUS: 2 - Symptomatic, <50% confined to bed  Vitals:   05/28/23 1210  BP: 125/87  Pulse: 90  Resp: 18  Temp: 97.6 F (36.4 C)  SpO2: 95%   Wt Readings from Last 3 Encounters:  05/28/23 108 lb (49 kg)  04/23/23 107 lb 3.2 oz (48.6 kg)  04/19/23 107 lb 8 oz (48.8 kg)     GENERAL:alert, no distress and comfortable SKIN: skin color, texture, turgor are normal, no rashes or significant lesions EYES: normal, Conjunctiva are pink and non-injected, sclera clear NECK: supple, thyroid normal size, non-tender, without nodularity LYMPH:  no palpable  lymphadenopathy in the cervical, axillary  LUNGS: clear to auscultation and percussion with normal breathing effort HEART: regular rate & rhythm and no murmurs and no lower extremity edema ABDOMEN:abdomen soft, non-tender and normal bowel sounds Musculoskeletal:no cyanosis of digits and no clubbing  NEURO: alert & oriented x 3 with fluent speech, no focal motor/sensory deficits  LABORATORY DATA:  I have reviewed the data as listed    Latest Ref Rng & Units 05/28/2023   11:46 AM 05/08/2023    1:40 PM 04/16/2023   11:59 AM  CBC  WBC 4.0 - 10.5 K/uL 12.3  8.5  13.0   Hemoglobin 12.0 - 15.0 g/dL 30.1  60.1  09.3   Hematocrit 36.0 - 46.0 % 33.8  31.4  33.5   Platelets 150 - 400 K/uL 211  94  190         Latest Ref Rng & Units 05/28/2023   11:46 AM 05/08/2023    1:40 PM 04/16/2023   11:59 AM  CMP  Glucose 70 - 99 mg/dL 235  573  220   BUN 8 - 23 mg/dL 34  26  23   Creatinine 0.44 - 1.00 mg/dL 2.54  2.70  6.23   Sodium 135 - 145 mmol/L 135  135  135   Potassium 3.5 - 5.1 mmol/L 4.5  4.4  4.9   Chloride 98 - 111 mmol/L 97  100  100   CO2 22 - 32 mmol/L 27  26  26    Calcium 8.9 - 10.3 mg/dL 76.2  9.8  9.7   Total Protein 6.5 - 8.1 g/dL 7.0  6.6    Total Bilirubin 0.3 - 1.2 mg/dL 0.7  0.7    Alkaline Phos 38 - 126 U/L 54  44    AST 15 - 41 U/L 23  20    ALT 0 - 44 U/L 19  18        RADIOGRAPHIC STUDIES: I have personally reviewed the radiological images as listed and agreed with the findings in the report. No results found.    Orders Placed This Encounter  Procedures   CBC with Differential (Cancer Center Only)    Standing Status:   Future    Standing Expiration Date:   07/08/2024   Basic Metabolic Panel - Cancer Center Only    Standing Status:   Future    Standing Expiration Date:   07/08/2024   CBC with Differential (Cancer Center Only)    Standing Status:   Future    Standing Expiration Date:   08/19/2024   Basic Metabolic Panel - Cancer Center Only  Standing Status:    Future    Standing Expiration Date:   08/19/2024   All questions were answered. The patient knows to call the clinic with any problems, questions or concerns. No barriers to learning was detected. The total time spent in the appointment was 25 minutes.     Malachy Mood, MD 05/28/2023   I, Sharlette Dense, CMA, am acting as scribe for Malachy Mood, MD.   I have reviewed the above documentation for accuracy and completeness, and I agree with the above.

## 2023-05-29 ENCOUNTER — Inpatient Hospital Stay: Payer: Medicare Other

## 2023-05-29 VITALS — BP 128/68 | HR 85 | Temp 97.9°F | Resp 18

## 2023-05-29 DIAGNOSIS — Z5111 Encounter for antineoplastic chemotherapy: Secondary | ICD-10-CM | POA: Diagnosis not present

## 2023-05-29 DIAGNOSIS — C9310 Chronic myelomonocytic leukemia not having achieved remission: Secondary | ICD-10-CM | POA: Diagnosis not present

## 2023-05-29 MED ORDER — HEPARIN SOD (PORK) LOCK FLUSH 100 UNIT/ML IV SOLN
500.0000 [IU] | Freq: Once | INTRAVENOUS | Status: AC | PRN
  Administered 2023-05-29: 500 [IU]

## 2023-05-29 MED ORDER — SODIUM CHLORIDE 0.9 % IV SOLN: Freq: Once | INTRAVENOUS | Status: AC

## 2023-05-29 MED ORDER — SODIUM CHLORIDE 0.9 % IV SOLN
75.0000 mg/m2 | Freq: Once | INTRAVENOUS | Status: AC
  Administered 2023-05-29: 114 mg via INTRAVENOUS
  Filled 2023-05-29: qty 11.4

## 2023-05-29 MED ORDER — SODIUM CHLORIDE 0.9% FLUSH
10.0000 mL | INTRAVENOUS | Status: DC | PRN
  Administered 2023-05-29: 10 mL

## 2023-05-29 MED ORDER — ONDANSETRON HCL 4 MG/2ML IJ SOLN
8.0000 mg | Freq: Once | INTRAMUSCULAR | Status: AC
  Administered 2023-05-29: 8 mg via INTRAVENOUS
  Filled 2023-05-29: qty 4

## 2023-05-29 NOTE — Patient Instructions (Signed)
[facility]  Discharge Instructions: Thank you for choosing [facility] to provide your oncology and hematology care.   If you have a lab appointment with the Cancer Center, please go directly to the Cancer Center and check in at the registration area.   Wear comfortable clothing and clothing appropriate for easy access to any Portacath or PICC line.   We strive to give you quality time with your provider. You may need to reschedule your appointment if you arrive late (15 or more minutes).  Arriving late affects you and other patients whose appointments are after yours.  Also, if you miss three or more appointments without notifying the office, you may be dismissed from the clinic at the provider's discretion.      For prescription refill requests, have your pharmacy contact our office and allow 72 hours for refills to be completed.    Today you received the following chemotherapy and/or immunotherapy agents: Vidaza     To help prevent nausea and vomiting after your treatment, we encourage you to take your nausea medication as directed.  BELOW ARE SYMPTOMS THAT SHOULD BE REPORTED IMMEDIATELY: *FEVER GREATER THAN 100.4 F (38 C) OR HIGHER *CHILLS OR SWEATING *NAUSEA AND VOMITING THAT IS NOT CONTROLLED WITH YOUR NAUSEA MEDICATION *UNUSUAL SHORTNESS OF BREATH *UNUSUAL BRUISING OR BLEEDING *URINARY PROBLEMS (pain or burning when urinating, or frequent urination) *BOWEL PROBLEMS (unusual diarrhea, constipation, pain near the anus) TENDERNESS IN MOUTH AND THROAT WITH OR WITHOUT PRESENCE OF ULCERS (sore throat, sores in mouth, or a toothache) UNUSUAL RASH, SWELLING OR PAIN  UNUSUAL VAGINAL DISCHARGE OR ITCHING   Items with * indicate a potential emergency and should be followed up as soon as possible or go to the Emergency Department if any problems should occur.  Please show the CHEMOTHERAPY ALERT CARD or IMMUNOTHERAPY ALERT CARD at check-in  to the Emergency Department and triage nurse.  Should you have questions after your visit or need to cancel or reschedule your appointment, please contact [facility]  Dept: [phone]  and follow the prompts.  Office hours are 8:00 a.m. to 4:30 p.m. Monday - Friday. Please note that voicemails left after 4:00 p.m. may not be returned until the following business day.  We are closed weekends and major holidays. You have access to a nurse at all times for urgent questions. Please call the main number to the clinic Dept: [phone] and follow the prompts.   For any non-urgent questions, you may also contact your provider using MyChart. We now offer e-Visits for anyone 18 and older to request care online for non-urgent symptoms. For details visit [email or web address].   Also download the MyChart app! Go to the app store, search "MyChart", open the app, select [facility], and log in with your MyChart username and password.  

## 2023-05-30 ENCOUNTER — Inpatient Hospital Stay: Payer: Medicare Other

## 2023-05-30 VITALS — BP 146/72 | HR 71 | Temp 97.5°F | Resp 18

## 2023-05-30 DIAGNOSIS — C9310 Chronic myelomonocytic leukemia not having achieved remission: Secondary | ICD-10-CM

## 2023-05-30 DIAGNOSIS — Z5111 Encounter for antineoplastic chemotherapy: Secondary | ICD-10-CM | POA: Diagnosis not present

## 2023-05-30 MED ORDER — ONDANSETRON HCL 4 MG/2ML IJ SOLN
8.0000 mg | Freq: Once | INTRAMUSCULAR | Status: AC
  Administered 2023-05-30: 8 mg via INTRAVENOUS
  Filled 2023-05-30: qty 4

## 2023-05-30 MED ORDER — SODIUM CHLORIDE 0.9 % IV SOLN
75.0000 mg/m2 | Freq: Once | INTRAVENOUS | Status: AC
  Administered 2023-05-30: 114 mg via INTRAVENOUS
  Filled 2023-05-30: qty 11.4

## 2023-05-30 MED ORDER — SODIUM CHLORIDE 0.9% FLUSH
10.0000 mL | INTRAVENOUS | Status: DC | PRN
  Administered 2023-05-30: 10 mL

## 2023-05-30 MED ORDER — HEPARIN SOD (PORK) LOCK FLUSH 100 UNIT/ML IV SOLN
500.0000 [IU] | Freq: Once | INTRAVENOUS | Status: AC | PRN
  Administered 2023-05-30: 500 [IU]

## 2023-05-30 MED ORDER — SODIUM CHLORIDE 0.9 % IV SOLN: Freq: Once | INTRAVENOUS | Status: AC

## 2023-05-30 NOTE — Patient Instructions (Signed)
[facility]  Discharge Instructions: Thank you for choosing [facility] to provide your oncology and hematology care.   If you have a lab appointment with the Cancer Center, please go directly to the Cancer Center and check in at the registration area.   Wear comfortable clothing and clothing appropriate for easy access to any Portacath or PICC line.   We strive to give you quality time with your provider. You may need to reschedule your appointment if you arrive late (15 or more minutes).  Arriving late affects you and other patients whose appointments are after yours.  Also, if you miss three or more appointments without notifying the office, you may be dismissed from the clinic at the provider's discretion.      For prescription refill requests, have your pharmacy contact our office and allow 72 hours for refills to be completed.    Today you received the following chemotherapy and/or immunotherapy agents: Vidaza     To help prevent nausea and vomiting after your treatment, we encourage you to take your nausea medication as directed.  BELOW ARE SYMPTOMS THAT SHOULD BE REPORTED IMMEDIATELY: *FEVER GREATER THAN 100.4 F (38 C) OR HIGHER *CHILLS OR SWEATING *NAUSEA AND VOMITING THAT IS NOT CONTROLLED WITH YOUR NAUSEA MEDICATION *UNUSUAL SHORTNESS OF BREATH *UNUSUAL BRUISING OR BLEEDING *URINARY PROBLEMS (pain or burning when urinating, or frequent urination) *BOWEL PROBLEMS (unusual diarrhea, constipation, pain near the anus) TENDERNESS IN MOUTH AND THROAT WITH OR WITHOUT PRESENCE OF ULCERS (sore throat, sores in mouth, or a toothache) UNUSUAL RASH, SWELLING OR PAIN  UNUSUAL VAGINAL DISCHARGE OR ITCHING   Items with * indicate a potential emergency and should be followed up as soon as possible or go to the Emergency Department if any problems should occur.  Please show the CHEMOTHERAPY ALERT CARD or IMMUNOTHERAPY ALERT CARD at check-in  to the Emergency Department and triage nurse.  Should you have questions after your visit or need to cancel or reschedule your appointment, please contact [facility]  Dept: [phone]  and follow the prompts.  Office hours are 8:00 a.m. to 4:30 p.m. Monday - Friday. Please note that voicemails left after 4:00 p.m. may not be returned until the following business day.  We are closed weekends and major holidays. You have access to a nurse at all times for urgent questions. Please call the main number to the clinic Dept: [phone] and follow the prompts.   For any non-urgent questions, you may also contact your provider using MyChart. We now offer e-Visits for anyone 18 and older to request care online for non-urgent symptoms. For details visit [email or web address].   Also download the MyChart app! Go to the app store, search "MyChart", open the app, select [facility], and log in with your MyChart username and password.  

## 2023-05-31 ENCOUNTER — Other Ambulatory Visit: Payer: Self-pay

## 2023-05-31 ENCOUNTER — Inpatient Hospital Stay: Payer: Medicare Other

## 2023-05-31 VITALS — BP 140/61 | HR 68 | Temp 97.6°F | Resp 17

## 2023-05-31 DIAGNOSIS — C9310 Chronic myelomonocytic leukemia not having achieved remission: Secondary | ICD-10-CM | POA: Diagnosis not present

## 2023-05-31 DIAGNOSIS — Z5111 Encounter for antineoplastic chemotherapy: Secondary | ICD-10-CM | POA: Diagnosis not present

## 2023-05-31 MED ORDER — ONDANSETRON HCL 4 MG/2ML IJ SOLN
8.0000 mg | Freq: Once | INTRAMUSCULAR | Status: AC
  Administered 2023-05-31: 8 mg via INTRAVENOUS
  Filled 2023-05-31: qty 4

## 2023-05-31 MED ORDER — SODIUM CHLORIDE 0.9% FLUSH
10.0000 mL | INTRAVENOUS | Status: DC | PRN
  Administered 2023-05-31: 10 mL

## 2023-05-31 MED ORDER — SODIUM CHLORIDE 0.9 % IV SOLN: Freq: Once | INTRAVENOUS | Status: AC

## 2023-05-31 MED ORDER — HEPARIN SOD (PORK) LOCK FLUSH 100 UNIT/ML IV SOLN
500.0000 [IU] | Freq: Once | INTRAVENOUS | Status: AC | PRN
  Administered 2023-05-31: 500 [IU]

## 2023-05-31 MED ORDER — SODIUM CHLORIDE 0.9 % IV SOLN
75.0000 mg/m2 | Freq: Once | INTRAVENOUS | Status: AC
  Administered 2023-05-31: 114 mg via INTRAVENOUS
  Filled 2023-05-31: qty 11.4

## 2023-06-01 ENCOUNTER — Other Ambulatory Visit: Payer: Self-pay

## 2023-06-01 ENCOUNTER — Emergency Department (HOSPITAL_COMMUNITY): Payer: Medicare Other

## 2023-06-01 ENCOUNTER — Inpatient Hospital Stay: Payer: Medicare Other

## 2023-06-01 ENCOUNTER — Emergency Department (HOSPITAL_COMMUNITY)
Admission: EM | Admit: 2023-06-01 | Discharge: 2023-06-01 | Disposition: A | Discharge status: Home / Self Care | Payer: Medicare Other | Attending: Emergency Medicine | Admitting: Emergency Medicine

## 2023-06-01 ENCOUNTER — Encounter (HOSPITAL_COMMUNITY): Payer: Self-pay

## 2023-06-01 ENCOUNTER — Telehealth: Payer: Self-pay | Admitting: Physician Assistant

## 2023-06-01 VITALS — BP 116/75 | HR 85 | Temp 98.7°F | Resp 18

## 2023-06-01 DIAGNOSIS — C9310 Chronic myelomonocytic leukemia not having achieved remission: Secondary | ICD-10-CM

## 2023-06-01 DIAGNOSIS — R000 Tachycardia, unspecified: Secondary | ICD-10-CM | POA: Diagnosis present

## 2023-06-01 DIAGNOSIS — I4891 Unspecified atrial fibrillation: Secondary | ICD-10-CM | POA: Diagnosis not present

## 2023-06-01 LAB — BASIC METABOLIC PANEL
Anion gap: 10 (ref 5–15)
BUN: 30 mg/dL — ABNORMAL HIGH (ref 8–23)
CO2: 27 mmol/L (ref 22–32)
Calcium: 10.3 mg/dL (ref 8.9–10.3)
Chloride: 99 mmol/L (ref 98–111)
Creatinine, Ser: 1.14 mg/dL — ABNORMAL HIGH (ref 0.44–1.00)
GFR, Estimated: 50 mL/min — ABNORMAL LOW (ref >60)
Glucose, Bld: 124 mg/dL — ABNORMAL HIGH (ref 70–99)
Potassium: 3.7 mmol/L (ref 3.5–5.1)
Sodium: 136 mmol/L (ref 135–145)

## 2023-06-01 LAB — CBC
HCT: 35 % — ABNORMAL LOW (ref 36.0–46.0)
Hemoglobin: 11.3 g/dL — ABNORMAL LOW (ref 12.0–15.0)
MCH: 33.8 pg (ref 26.0–34.0)
MCHC: 32.3 g/dL (ref 30.0–36.0)
MCV: 104.8 fL — ABNORMAL HIGH (ref 80.0–100.0)
Platelets: 195 10*3/uL (ref 150–400)
RBC: 3.34 MIL/uL — ABNORMAL LOW (ref 3.87–5.11)
RDW: 14.4 % (ref 11.5–15.5)
WBC: 12.8 10*3/uL — ABNORMAL HIGH (ref 4.0–10.5)
nRBC: 0 % (ref 0.0–0.2)

## 2023-06-01 MED ORDER — HEPARIN SOD (PORK) LOCK FLUSH 100 UNIT/ML IV SOLN
INTRAVENOUS | Status: AC
  Filled 2023-06-01: qty 5

## 2023-06-01 MED ORDER — DILTIAZEM HCL ER COATED BEADS 360 MG PO CP24: 360.0000 mg | ORAL_CAPSULE | Freq: Every day | ORAL | 0 refills | Status: DC

## 2023-06-01 MED ORDER — SODIUM CHLORIDE 0.9 % IV SOLN
75.0000 mg/m2 | Freq: Once | INTRAVENOUS | Status: DC
  Filled 2023-06-01: qty 11.4

## 2023-06-01 MED ORDER — ONDANSETRON HCL 4 MG/2ML IJ SOLN
8.0000 mg | Freq: Once | INTRAMUSCULAR | Status: DC
  Filled 2023-06-01: qty 4

## 2023-06-01 MED ORDER — SODIUM CHLORIDE 0.9% FLUSH: 10.0000 mL | INTRAVENOUS | Status: DC | PRN

## 2023-06-01 MED ORDER — DILTIAZEM HCL 30 MG PO TABS
30.0000 mg | ORAL_TABLET | Freq: Once | ORAL | Status: AC
  Administered 2023-06-01: 30 mg via ORAL
  Filled 2023-06-01: qty 1

## 2023-06-01 MED ORDER — SODIUM CHLORIDE 0.9 % IV SOLN: Freq: Once | INTRAVENOUS | Status: AC

## 2023-06-01 MED ORDER — HEPARIN SOD (PORK) LOCK FLUSH 100 UNIT/ML IV SOLN: 500.0000 [IU] | Freq: Once | INTRAVENOUS | Status: DC | PRN

## 2023-06-01 NOTE — Discharge Instructions (Signed)
1.  I have written a prescription for an increased dose of your diltiazem (Cardizem).  You are currently taking diltiazem 240 mg in the evening.  Take that dose this evening when you get home.  Tomorrow fill the prescription for diltiazem (Cardizem) 360 mg and start taking that tomorrow evening.  Do not add this to your previous dose.  Discard or set aside the prior dose of 240 mg.  You may need several adjustments of your medications for heart rate control.  Is very important that you call your family doctor or cardiologist tomorrow to discuss ongoing heart rate monitoring and dosing of your medications. 2.  You have atrial fibrillation.  Depending on certain risk factors people are treated with a blood thinner.  You had previously been taking a blood thinner but it appears it was stopped due to a problem with intestinal bleeding.  Reviewed this with your doctor and determine if you should be restarted on the blood thinner or it is still too dangerous for you to take. 3.  Return to emergency department immediately if you have new worsening or concerning symptoms.

## 2023-06-01 NOTE — ED Notes (Signed)
Pt ambulated to the bathroom without assistance. Tolerated well.

## 2023-06-01 NOTE — ED Triage Notes (Signed)
Per CA PA (from cancer center)  A. Fib withRVR New onset No blood thinners Pt denies symptoms

## 2023-06-01 NOTE — ED Provider Notes (Signed)
H/O PAF at cancer center rate was rapid. Asymptomatic.  Given PO cardizem. Not chronically anticoagulated. H/O GI bleed. F/U labs and dispo. Physical Exam  BP 124/80   Pulse (!) 119   Temp 97.9 F (36.6 C) (Oral)   Resp 18   Ht 5\' 2"  (1.575 m)   Wt 49 kg   SpO2 99%   BMI 19.75 kg/m   Physical Exam  Procedures  Procedures  ED Course / MDM    Medical Decision Making Amount and/or Complexity of Data Reviewed Labs: ordered. Radiology: ordered.  Risk Prescription drug management.   17: 00 patient reports that she feels fine.  She reports she never felt badly but she feels at baseline.  Monitor shows heart rate in the 80s atrial fibrillation narrow complex.  Patient's blood pressures have been stable in the 120s over 80s or 90s.  This time stable for discharge.  Reviewed a plan with the patient and her family member at bedside of increasing her daily Cardizem dose.  I prescribed for 360 mg extended release.  Patient will continue her metoprolol as prescribed.  I also advised the patient and her family member to discuss if the patient is again a candidate for Eliquis or she should continue without anticoagulation therapy, based on bleeding risk.       Arby Barrette, MD 06/01/23 1710

## 2023-06-01 NOTE — Telephone Encounter (Signed)
Called to infusion center to evaluate patient for irregular HR. On arrival HR ranging from 83-136. She is asymptomatic, unsure when this started. EKS showing afib with RVR. Brief chart review shows she has a history of paroxsymal afib and last saw cardiology 03/27/23. At that visit EKG showed NSR. Patient will not receive treatment today and instead will have ED evaluation. She did not receive any mediations while here. Report given to accepting ED RN. Oncologist made aware.

## 2023-06-01 NOTE — ED Provider Notes (Signed)
[place] EMERGENCY DEPARTMENT AT [facility] Provider Note   CSN: [id number] Arrival date & time: 06/01/23  1414     History  Chief Complaint  Patient presents with   Irregular Heart Beat    [name] is a 77 y.o. female.  HPI 77 year old female sent here from cancer center due to rapid heart rate.  Patient reports that she has felt well and did not have any symptoms.  She was therefore chemotherapy.  She states she is presenting for day 5 of multiple treatments.  She reports that she lives in her own home and has been getting around well.  She denies any fever, chills, cough, chest pain, dyspnea, lightheadedness, nausea, vomiting, or diarrhea.  She reports that she has had previous episodes of this happen in the past but cannot recall how it was treated.  She states she is not on any blood thinners and she follows with audiology    Home Medications Prior to Admission medications   Medication Sig Start Date End Date Taking? Authorizing Provider  acetaminophen-codeine (TYLENOL #3) 300-30 MG tablet Take 1 tablet by mouth every 6 (six) hours as needed for moderate pain. 05/28/23   Malachy Mood, MD  denosumab (PROLIA) 60 MG/ML SOSY injection Inject 60 mg into the skin every 6 (six) months.    [provider]  diltiazem (CARDIZEM CD) 240 MG 24 hr capsule TAKE 1 CAPSULE(240 MG) BY MOUTH DAILY 03/19/23   [name], Deloris Ping, MD  furosemide (LASIX) 20 MG tablet Take 1 tablet (20 mg total) by mouth daily. 05/21/22 05/21/23  Erick Blinks, MD  gabapentin (NEURONTIN) 300 MG capsule Take 300 mg by mouth in the morning. 07/21/14   [provider]  Metoprolol Tartrate 75 MG TABS Take 150 mg by mouth 2 (two) times daily. 07/24/22   [name], Deloris Ping, MD  potassium chloride SA (KLOR-CON M20) 20 MEQ tablet Take 1 tablet (20 mEq total) by mouth daily. Take with furosemide 05/22/23   [name], Deloris Ping, MD  predniSONE (DELTASONE) 5 MG tablet Take 5 mg by mouth daily with breakfast.  09/22/21   [provider]  Tiotropium Bromide-Olodaterol (STIOLTO RESPIMAT) 2.5-2.5 MCG/ACT AERS Inhale 2 puffs into the lungs daily. 04/23/23   Josephine Igo, DO      Allergies    Digoxin and related, Gluten meal, Penicillins, Fexofenadine, Alendronate, and Hydroxychloroquine    Review of Systems   Review of Systems  Physical Exam Updated Vital Signs BP 124/80   Pulse (!) 119   Temp 97.9 F (36.6 C) (Oral)   Resp 18   Ht 1.575 m (5\' 2" )   Wt 49 kg   SpO2 99%   BMI 19.75 kg/m  Physical Exam Vitals reviewed.  Constitutional:      Appearance: Normal appearance.  HENT:     Head: Normocephalic.     Right Ear: External ear normal.     Left Ear: External ear normal.     Mouth/Throat:     Pharynx: Oropharynx is clear.  Eyes:     Pupils: Pupils are equal, round, and reactive to light.  Cardiovascular:     Rate and Rhythm: Tachycardia present. Rhythm irregular.  Pulmonary:     Effort: Pulmonary effort is normal.     Breath sounds: Normal breath sounds.     Comments: Port right anterior chest No surrounding warmth, induration, or tenderness Abdominal:     General: Bowel sounds are normal.     Palpations: Abdomen is soft.  Musculoskeletal:        General: Normal range of motion.     Cervical back: Normal range of motion.  Skin:    General: Skin is warm and dry.     Capillary Refill: Capillary refill takes less than 2 seconds.  Neurological:     General: No focal deficit present.     Mental Status: She is alert.  Psychiatric:        Mood and Affect: Mood normal.     ED Results / Procedures / Treatments   Labs (all labs ordered are listed, but only abnormal results are displayed) Labs Reviewed  CBC - Abnormal; Notable for the following components:      Result Value   WBC 12.8 (*)    RBC 3.34 (*)    Hemoglobin 11.3 (*)    HCT 35.0 (*)    MCV 104.8 (*)    All other components within normal limits  BASIC METABOLIC PANEL    EKG None  Radiology DG  Chest Port 1 View  Result Date: 06/01/2023 CLINICAL DATA:  Atrial fibrillation. EXAM: PORTABLE CHEST 1 VIEW COMPARISON:  01/05/2023 FINDINGS: Mild patient rotation. Mild cardiomegaly. Unchanged mediastinal contours. Aortic atherosclerosis. Accessed right chest port with tip in the SVC. No pulmonary edema, pleural effusion or pneumothorax. Right proximal humeral hardware. IMPRESSION: Mild cardiomegaly. No acute chest findings. Electronically Signed   By: Narda Rutherford M.D.   On: 06/01/2023 15:27    Procedures Procedures    Medications Ordered in ED Medications  diltiazem (CARDIZEM) tablet 30 mg (30 mg Oral Given 06/01/23 1502)    ED Course/ Medical Decision Making/ A&P                             Medical Decision Making Amount and/or Complexity of Data Reviewed Labs: ordered. Radiology: ordered.  Risk Prescription drug management.   Reviewed records from cardiology.  Patient has been on Eliquis in the past.  She was in A-fib with RVR in May 2023 when she was hospitalized with anemia 77 year old female history of paroxysmal A-fib presents today from cardiology clinic with A-fib with RVR. She reports taking her home medications which include Cardizem as prescribed.  She does not report being on Eliquis and this is not listed in her medications. Patient remains hemodynamically stable HGB normal        Final Clinical Impression(s) / ED Diagnoses Final diagnoses:  Atrial fibrillation with RVR [facility])    Rx / DC Orders ED Discharge Orders     None         Margarita Grizzle, MD 06/01/23 1547

## 2023-06-01 NOTE — Progress Notes (Signed)
Upon taking vital signs, RN noticed irregular pulse rate. [facility] PA informed. PA ordered EKG. EKG showed RVR with ST abnormality. Pt transferred to ED in wheelchair by [facility] PA and charge RN.

## 2023-06-03 ENCOUNTER — Encounter: Payer: Self-pay | Admitting: Hematology

## 2023-06-03 ENCOUNTER — Ambulatory Visit (HOSPITAL_COMMUNITY)
Admission: RE | Admit: 2023-06-03 | Discharge: 2023-06-03 | Disposition: A | Discharge status: Home / Self Care | Payer: Medicare Other | Source: Ambulatory Visit | Attending: Hematology | Admitting: Hematology

## 2023-06-03 DIAGNOSIS — S92302G Fracture of unspecified metatarsal bone(s), left foot, subsequent encounter for fracture with delayed healing: Secondary | ICD-10-CM | POA: Insufficient documentation

## 2023-06-03 DIAGNOSIS — M545 Low back pain, unspecified: Secondary | ICD-10-CM | POA: Diagnosis not present

## 2023-06-03 MED ORDER — GADOBUTROL 1 MMOL/ML IV SOLN
5.0000 mL | Freq: Once | INTRAVENOUS | Status: AC | PRN
  Administered 2023-06-03: 5 mL via INTRAVENOUS

## 2023-06-04 ENCOUNTER — Inpatient Hospital Stay (HOSPITAL_BASED_OUTPATIENT_CLINIC_OR_DEPARTMENT_OTHER): Payer: Medicare Other | Admitting: Hematology

## 2023-06-04 ENCOUNTER — Telehealth: Payer: Self-pay | Admitting: Hematology

## 2023-06-04 ENCOUNTER — Encounter: Payer: Self-pay | Admitting: Cardiovascular Disease

## 2023-06-04 ENCOUNTER — Encounter: Payer: Self-pay | Admitting: Hematology

## 2023-06-04 DIAGNOSIS — C9310 Chronic myelomonocytic leukemia not having achieved remission: Secondary | ICD-10-CM

## 2023-06-04 NOTE — Progress Notes (Signed)
[facility]   [phone] [phone]   Clinic Follow up Note   Patient Care Team: Maurice Small, MD (Inactive) as PCP - General (Family Medicine) [name], Deloris Ping, MD as PCP - Cardiology (Cardiology) Zenovia Jordan, MD as Consulting Physician (Rheumatology) Malachy Mood, MD as Consulting Physician (Hematology) Charna Elizabeth, MD as Consulting Physician (Gastroenterology) Josephine Igo, DO as Consulting Physician (Pulmonary Disease)  Date of Service:  06/04/2023  I connected with Normand Sloop on 06/04/2023 at  3:45 PM EDT by telephone visit and verified that I am speaking with the correct person using two identifiers.  I discussed the limitations, risks, security and privacy concerns of performing an evaluation and management service by telephone and the availability of in person appointments. I also discussed with the patient that there may be a patient responsible charge related to this service. The patient expressed understanding and agreed to proceed.   Other persons participating in the visit and their role in the encounter:  Husband  Patient's location:  Home Provider's location:  [facility] Office  CHIEF COMPLAINT: f/u of CMML    CURRENT THERAPY:  MYELODYSPLASIA Azacitidine IV D1-7 q 6 weeks    ASSESSMENT & PLAN:  [name] is a 77 y.o. female with   CMML (chronic myelomonocytic leukemia) (HCC) -diagnosed in 10/2021 ---She developed worsening back pain, pelvic MRI 07/04/21 showed a mass in the presacral space. 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 Her BM biopsy was reviewed at [facility] and was felt to be CMML-1 with 5% blasts.  -She began azacitadine on 02/06/22. she has had multiple hospital admissions and her performance status has been very low.  Changed her  chemo to every 6 weeks in 08/2022 due to her fatigue. -she is tolerating every 6 weeks much better, feels better and able to do all her ADL now -she saw Dr. Sharyne Richters in Feb 2024, who recommends to continue current therapy  -Lab reviewed, adequate for treatment, will proceed cycle 7 Vidaza -Continue to monitor her blood counts closely -Follow-up in 6 weeks for cycle 8 Vidaza -She is overall stable, her husband noticed worsening low back pain, will repeat lumbar MRI for evaluation -I reviewed her lumbar spine MRI with and without contrast from yesterday, which showed enlarging presacral soft tissue mass, measuring 8 cm now, and degenerative changes. -I will reach out to radiation oncologist to see if she would benefit from palliative radiation to presacral mass, which was previously biopsied and confirmed CMML.      Plan: -reviewed MRI with pt -I will reach out to Radiation oncology to see if she would benefit from palliative radiation Mosetta Putt to her presacral mass -Follow-up in 2 weeks -Lab, follow-up and chemo in 5 weeks   SUMMARY OF ONCOLOGIC HISTORY: Oncology History Overview Note   Cancer Staging  CMML (chronic myelomonocytic leukemia) (HCC) Staging form: Chronic Myeloid Leukemia, AJCC 8th Edition - Clinical stage from 01/12/2022: Bone marrow blast count (%): 12, Additional clonal changes: Unknown - Signed by Malachy Mood, MD on 01/12/2022 Stage prefix: Initial diagnosis     CMML (chronic myelomonocytic leukemia) (HCC)  11/09/2021 Initial Biopsy   DIAGNOSIS:   -  Monoclonal B-cell population with co-expression of CD5 comprises 17%  of all lymphocytes  -  See comment   COMMENT:  In addition to the clonal B-cell population, there is a myeloblast  population (  CD34, CD38, HLA-DR, CD117, CD123 and CD33) that comprises 2% of the total cellular events.  Please see concurrent tissue biopsy (below) for additional work-up and final diagnosis.    FINAL MICROSCOPIC DIAGNOSIS:   A. SOFT TISSUE  MASS, PRE SACRAL, NEEDLE CORE BIOPSY:  -  Chronic lymphocytic leukemia/small lymphocytic lymphoma  -  Extra medullary hematopoiesis  -  See comment   COMMENT:  The biopsy consists of multiple soft tissue cores with lymphoid nodules and a dense hematopoietic infiltrate consistent with extra medullary hematopoiesis.  MPO and E-cadherin highlight myeloid and erythroid precursors respectively.  CD34 highlights increased vasculature and is also positive within the cytoplasm of megakaryocytes.  A few small, immature mononuclear cells appear to be positive for CD34 and CD117. TdT shows rare, scattered positive cells.  CD20 highlights aggregates of B cells which are admixed with CD3 positive T cells.  T cells are an admixture of CD4 and CD8.  The B cells are also positive for CD5, CD23 and Bcl-2.  The B cells do not show significant staining for CD10, BCL6 or cyclin D1.  CD138 highlights scattered plasma cells which are polytypic by kappa and lambda in situ hybridization.  Flow cytometry performed on the sample (see WL [id number]) identified a kappa restricted CD5 positive B-cell population comprising 70% of lymphocytes.  In addition, a small myeloblast population comprised 2% of the total cellular events.   Overall, the findings are consistent with soft tissue involvement by  chronic lymphocytic leukemia/small lymphocytic lymphoma and extra medullary hematopoiesis. In reviewing the patient's CBC data (macrocytic anemia and thrombocytopenia), I would recommend a bone marrow biopsy to assess for marrow involvement by CLL/SLL.    12/23/2021 Imaging   EXAM: CT CHEST, ABDOMEN, AND PELVIS WITH CONTRAST  IMPRESSION: 1. Slight interval enlargement of a presacral soft tissue mass measuring 7.2 x 4.7 cm, previously 6.9 x 4.1 cm on prior MR of the pelvis dated 07/04/2021. By report, this represents a biopsy proven lymphoma. 2. Pleural nodule of the dependent right lower lobe overlying the posterior right tenth rib  and pleural or paraspinous soft tissue mass overlying the right aspect of the T10 vertebral body, very slightly enlarged compared to prior examination of the chest dated 06/25/2020, consistent with additional sites of lymphomatous involvement given very indolent growth. These could be better assessed for metabolic activity by FDG PET/CT if desired. 3. There is mild, bibasilar predominant pulmonary fibrosis in a pattern featuring irregular peripheral interstitial opacity, septal thickening, but without clear evidence of subpleural bronchiolectasis or honeycombing, with a somewhat asymmetric distribution most conspicuously involving the right lower lobe and lingula. These findings are significantly worsened when compared to prior examination dated 06/25/2020, particularly in the right lower lobe. Given interval change, this may reflect sequelae of interval infection or aspiration, however appearance is generally suspicious for fibrotic interstitial lung disease, and if characterized by ATS pulmonary fibrosis is in an "indeterminate for UIP" pattern, differential considerations including both UIP and NSIP. 4. Emphysema.   Aortic Atherosclerosis (ICD10-I70.0) and Emphysema (ICD10-J43.9).   01/05/2022 Pathology Results   DIAGNOSIS:   BONE MARROW, ASPIRATE, CLOT, CORE:  -Hypercellular bone marrow for age with features of  myelodysplastic/myeloproliferative neoplasm  -Minor abnormal B-cell population  -See comment   PERIPHERAL BLOOD:  -Macrocytic anemia  -Neutrophilic left shift and monocytosis  -Thrombocytopenia   COMMENT:  The bone marrow is hypercellular for age with dyspoietic changes  involving myeloid cell lines associated with monocytosis and increased number of blastic cells (12%) as primarily seen  by morphology, many of which display monocytic features.  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  In this background, there are several predominantly small lymphoid aggregates mostly composed of small lymphoid cells.  By flow cytometry, a minor abnormal B-cell population expressing CD5 is seen and representing 2% of all cells.  This correlate with previously known B-cell lymphoproliferative process.  Correlation with cytogenetic and FISH studies is strongly recommended.    DIAGNOSIS:   -Increased number of monocytic cells present (25%)  -Minor abnormal B-cell population identified.  -See comment   COMMENT:  Flow cytometric analysis shows increased number of monocytic cells representing 25% of all cells but without aberrant phenotype or CD34 expression.  A significant CD34-positive blastic population is not identified.  The lymphoid population shows a minor B-cell population representing 2% of all cells and expressing B-cell antigens including CD20 associated with CD5, CD200 and possibly dim kappa expression.  The latter findings are abnormal and correlate with previously known B-cell lymphoproliferative process.  No significant T-cell phenotypic abnormalities identified.    01/12/2022 Initial Diagnosis   CMML (chronic myelomonocytic leukemia) (HCC)   01/12/2022 Cancer Staging   Staging form: Chronic Myeloid Leukemia, AJCC 8th Edition - Clinical stage from 01/12/2022: Bone marrow blast count (%): 12, Additional clonal changes: Unknown - Signed by Malachy Mood, MD on 01/12/2022 Stage prefix: Initial diagnosis   02/06/2022 - 08/11/2022 Chemotherapy   Patient is on Treatment Plan : MYELODYSPLASIA  Azacitidine IV D1-7 q28d     02/06/2022 -  Chemotherapy   Patient is on Treatment Plan : MYELODYSPLASIA  Azacitidine IV D1-7 q28d        INTERVAL HISTORY:  RAENA PAU was contacted for a follow up of CMML . She was last seen by me on 05/28/2023. Pt state the she was in the ED last week for about 5  hours, because of AFIB. Pt state that she has lower back pain, and she take Tylenol to manage it and it seems to help.   All other systems were reviewed with the patient and are negative.  MEDICAL HISTORY:  Past Medical History:  Diagnosis Date   Arthritis    Rheumatoid arthritis   Celiac disease    Chronic kidney disease    stage 3 from MD notes   COPD (chronic obstructive pulmonary disease) (HCC)    Dyspnea    with going up stairs   Family history of adverse reaction to anesthesia    father had hard time waking up   Headache    sinus headaches   Hot flashes    Hypertension    Iron deficiency anemia    Pneumonia    per patient "I have walking pneumonia"    SURGICAL HISTORY: Past Surgical History:  Procedure Laterality Date   COLONOSCOPY     ECTOPIC PREGNANCY SURGERY      x 2   IR IMAGING GUIDED PORT INSERTION  01/31/2022   REVERSE SHOULDER ARTHROPLASTY Right 02/02/2017   Procedure: RIGHT REVERSE SHOULDER ARTHROPLASTY;  Surgeon: Beverely Low, MD;  Location: [facility] OR;  Service: Orthopedics;  Laterality: Right;    I have reviewed the social history and family history with the patient and they are unchanged from previous note.  ALLERGIES:  is allergic to digoxin and related, gluten meal, penicillins, fexofenadine, alendronate, and hydroxychloroquine.  MEDICATIONS:  Current Outpatient Medications  Medication Sig Dispense Refill   acetaminophen-codeine (  TYLENOL #3) 300-30 MG tablet Take 1 tablet by mouth every 6 (six) hours as needed for moderate pain. 20 tablet 0   denosumab (PROLIA) 60 MG/ML SOSY injection Inject 60 mg into the skin every 6 (six) months.     diltiazem (CARDIZEM CD) 240 MG 24 hr capsule TAKE 1 CAPSULE(240 MG) BY MOUTH DAILY 30 capsule 11   diltiazem (CARDIZEM CD) 360 MG 24 hr capsule Take 1 capsule (360 mg total) by mouth daily. 30 capsule 0   furosemide (LASIX) 20 MG tablet Take 1 tablet (20 mg total) by mouth daily. 30 tablet 11   gabapentin (NEURONTIN) 300 MG  capsule Take 300 mg by mouth in the morning.     Metoprolol Tartrate 75 MG TABS Take 150 mg by mouth 2 (two) times daily. 360 tablet 3   potassium chloride SA (KLOR-CON M20) 20 MEQ tablet Take 1 tablet (20 mEq total) by mouth daily. Take with furosemide 90 tablet 3   predniSONE (DELTASONE) 5 MG tablet Take 5 mg by mouth daily with breakfast.     Tiotropium Bromide-Olodaterol (STIOLTO RESPIMAT) 2.5-2.5 MCG/ACT AERS Inhale 2 puffs into the lungs daily. 4 g 11   No current facility-administered medications for this visit.   Facility-Administered Medications Ordered in Other Visits  Medication Dose Route Frequency Provider Last Rate Last Admin   acetaminophen (TYLENOL) 325 MG tablet            diphenhydrAMINE (BENADRYL) 25 mg capsule             PHYSICAL EXAMINATION: ECOG PERFORMANCE STATUS: 2 - Symptomatic, <50% confined to bed  There were no vitals filed for this visit. Wt Readings from Last 3 Encounters:  06/01/23 108 lb (49 kg)  05/28/23 108 lb (49 kg)  04/23/23 107 lb 3.2 oz (48.6 kg)     No vitals taken today, Exam not performed today  LABORATORY DATA:  I have reviewed the data as listed    Latest Ref Rng & Units 06/01/2023    2:54 PM 05/28/2023   11:46 AM 05/08/2023    1:40 PM  CBC  WBC 4.0 - 10.5 K/uL 12.8  12.3  8.5   Hemoglobin 12.0 - 15.0 g/dL 40.9  81.1  91.4   Hematocrit 36.0 - 46.0 % 35.0  33.8  31.4   Platelets 150 - 400 K/uL 195  211  94         Latest Ref Rng & Units 06/01/2023    2:54 PM 05/28/2023   11:46 AM 05/08/2023    1:40 PM  CMP  Glucose 70 - 99 mg/dL 782  956  213   BUN 8 - 23 mg/dL 30  34  26   Creatinine 0.44 - 1.00 mg/dL 0.86  5.78  4.69   Sodium 135 - 145 mmol/L 136  135  135   Potassium 3.5 - 5.1 mmol/L 3.7  4.5  4.4   Chloride 98 - 111 mmol/L 99  97  100   CO2 22 - 32 mmol/L 27  27  26    Calcium 8.9 - 10.3 mg/dL 62.9  52.8  9.8   Total Protein 6.5 - 8.1 g/dL  7.0  6.6   Total Bilirubin 0.3 - 1.2 mg/dL  0.7  0.7   Alkaline Phos 38 - 126 U/L   54  44   AST 15 - 41 U/L  23  20   ALT 0 - 44 U/L  19  18       RADIOGRAPHIC  STUDIES: I have personally reviewed the radiological images as listed and agreed with the findings in the report. MR Lumbar Spine W Wo Contrast  Result Date: 06/03/2023 CLINICAL DATA:  Low back pain with history of prior surgery. Presacral mass on prior imaging. EXAM: MRI LUMBAR SPINE WITHOUT AND WITH CONTRAST TECHNIQUE: Multiplanar and multiecho pulse sequences of the lumbar spine were obtained without and with intravenous contrast. CONTRAST:  5mL GADAVIST GADOBUTROL 1 MMOL/ML IV SOLN COMPARISON:  07/07/2022 FINDINGS: Segmentation:  Standard. Alignment: Dextroscoliosis. Right lateral translation at L3-4. Mild L4-5 anterolisthesis. Vertebrae:  No fracture, evidence of discitis, or bone lesion. Conus medullaris and cauda equina: Conus extends to the L1 level. Conus and cauda equina appear normal. Paraspinal and other soft tissues: Presacral mass which is partially covered. The mass measures at least 8 cm craniocaudal and 4.6 cm in thickness. Even less coverage on comparison from 2023. The mass is long-standing with visualization since at least 2016, at least doubled in size since that time. Some internal fat was confirmed on a 2016 abdominal CT, although less pronounced today. Mass could reflect a myelolipoma, low-grade sarcoma, or teratoma. Disc levels: T12- L1: Unremarkable. L1-L2: Small central protrusion. L2-L3: Disc narrowing and bulging with left paracentral extrusion. Degenerative facet spurring. Left subarticular recess stenosis with left L3 impingement. L3-L4: The disc is narrowed and bulging with endplate and facet spurring. Moderate right foraminal stenosis L4-L5: Disc collapse with endplate and facet spurring eccentric to the right. Advanced right foraminal stenosis. Right subarticular recess narrowing that could affect the right L5 nerve root L5-S1:Disc narrowing with endplate and facet spurring. Moderate right  foraminal narrowing. IMPRESSION: 1. Long-standing presacral mass, at least doubled in size since 2016, now at least 8 cm in length. There is internal fat components and differential considerations include myelolipoma, liposarcoma, and teratoma. 2. 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 3. L2-3 left paracentral extrusion compressing the left L3 nerve root. 4. Right foraminal impingement at L3-4 to L4-5, greatest at L4-5. Right subarticular recess narrowing at L3-4 and L4-5. Electronically Signed   By: Tiburcio Pea M.D.   On: 06/03/2023 11:28      Orders Placed This Encounter  Procedures   Ambulatory referral to Radiation Oncology    Referral Priority:   Routine    Referral Type:   Consultation    Referral Reason:   Specialty Services Required    Requested Specialty:   Radiation Oncology    Number of Visits Requested:   1   All questions were answered. The patient knows to call the clinic with any problems, questions or concerns. No barriers to learning was detected. The total time spent in the appointment was 21 minutes.     Malachy Mood, MD 06/04/2023   Carolin Coy am acting as scribe for Malachy Mood, MD.   I have reviewed the above documentation for accuracy and completeness, and I agree with the above.

## 2023-06-04 NOTE — Telephone Encounter (Signed)
Contacted patient to scheduled appointments. Patient is aware of appointments that are scheduled.   

## 2023-06-05 ENCOUNTER — Inpatient Hospital Stay: Payer: Medicare Other

## 2023-06-05 ENCOUNTER — Other Ambulatory Visit: Payer: Self-pay

## 2023-06-05 ENCOUNTER — Other Ambulatory Visit: Payer: Self-pay | Admitting: Hematology

## 2023-06-05 ENCOUNTER — Telehealth: Payer: Self-pay

## 2023-06-05 VITALS — BP 117/60 | HR 74 | Temp 98.0°F | Resp 17 | Wt 106.8 lb

## 2023-06-05 DIAGNOSIS — I4891 Unspecified atrial fibrillation: Secondary | ICD-10-CM | POA: Diagnosis not present

## 2023-06-05 DIAGNOSIS — Z5111 Encounter for antineoplastic chemotherapy: Secondary | ICD-10-CM | POA: Diagnosis not present

## 2023-06-05 DIAGNOSIS — R1909 Other intra-abdominal and pelvic swelling, mass and lump: Secondary | ICD-10-CM | POA: Diagnosis not present

## 2023-06-05 DIAGNOSIS — C9310 Chronic myelomonocytic leukemia not having achieved remission: Secondary | ICD-10-CM | POA: Diagnosis not present

## 2023-06-05 MED ORDER — HEPARIN SOD (PORK) LOCK FLUSH 100 UNIT/ML IV SOLN
500.0000 [IU] | Freq: Once | INTRAVENOUS | Status: AC | PRN
  Administered 2023-06-05: 500 [IU]

## 2023-06-05 MED ORDER — SODIUM CHLORIDE 0.9 % IV SOLN: Freq: Once | INTRAVENOUS | Status: AC

## 2023-06-05 MED ORDER — DILTIAZEM HCL ER COATED BEADS 360 MG PO CP24: 360.0000 mg | ORAL_CAPSULE | Freq: Every day | ORAL | 3 refills | Status: DC

## 2023-06-05 MED ORDER — APIXABAN 5 MG PO TABS: 5.0000 mg | ORAL_TABLET | Freq: Two times a day (BID) | ORAL | 11 refills | Status: DC

## 2023-06-05 MED ORDER — ONDANSETRON HCL 4 MG/2ML IJ SOLN
8.0000 mg | Freq: Once | INTRAMUSCULAR | Status: AC
  Administered 2023-06-05: 8 mg via INTRAVENOUS
  Filled 2023-06-05: qty 4

## 2023-06-05 MED ORDER — SODIUM CHLORIDE 0.9 % IV SOLN
75.0000 mg/m2 | Freq: Once | INTRAVENOUS | Status: AC
  Administered 2023-06-05: 114 mg via INTRAVENOUS
  Filled 2023-06-05: qty 11.4

## 2023-06-05 MED ORDER — SODIUM CHLORIDE 0.9% FLUSH
10.0000 mL | INTRAVENOUS | Status: DC | PRN
  Administered 2023-06-05: 10 mL

## 2023-06-05 NOTE — Patient Instructions (Signed)
[facility]  Discharge Instructions: Thank you for choosing [facility] to provide your oncology and hematology care.   If you have a lab appointment with the Cancer Center, please go directly to the Cancer Center and check in at the registration area.   Wear comfortable clothing and clothing appropriate for easy access to any Portacath or PICC line.   We strive to give you quality time with your provider. You may need to reschedule your appointment if you arrive late (15 or more minutes).  Arriving late affects you and other patients whose appointments are after yours.  Also, if you miss three or more appointments without notifying the office, you may be dismissed from the clinic at the provider's discretion.      For prescription refill requests, have your pharmacy contact our office and allow 72 hours for refills to be completed.    Today you received the following chemotherapy and/or immunotherapy agents: Vidaza     To help prevent nausea and vomiting after your treatment, we encourage you to take your nausea medication as directed.  BELOW ARE SYMPTOMS THAT SHOULD BE REPORTED IMMEDIATELY: *FEVER GREATER THAN 100.4 F (38 C) OR HIGHER *CHILLS OR SWEATING *NAUSEA AND VOMITING THAT IS NOT CONTROLLED WITH YOUR NAUSEA MEDICATION *UNUSUAL SHORTNESS OF BREATH *UNUSUAL BRUISING OR BLEEDING *URINARY PROBLEMS (pain or burning when urinating, or frequent urination) *BOWEL PROBLEMS (unusual diarrhea, constipation, pain near the anus) TENDERNESS IN MOUTH AND THROAT WITH OR WITHOUT PRESENCE OF ULCERS (sore throat, sores in mouth, or a toothache) UNUSUAL RASH, SWELLING OR PAIN  UNUSUAL VAGINAL DISCHARGE OR ITCHING   Items with * indicate a potential emergency and should be followed up as soon as possible or go to the Emergency Department if any problems should occur.  Please show the CHEMOTHERAPY ALERT CARD or IMMUNOTHERAPY ALERT CARD at check-in  to the Emergency Department and triage nurse.  Should you have questions after your visit or need to cancel or reschedule your appointment, please contact [facility]  Dept: [phone]  and follow the prompts.  Office hours are 8:00 a.m. to 4:30 p.m. Monday - Friday. Please note that voicemails left after 4:00 p.m. may not be returned until the following business day.  We are closed weekends and major holidays. You have access to a nurse at all times for urgent questions. Please call the main number to the clinic Dept: [phone] and follow the prompts.   For any non-urgent questions, you may also contact your provider using MyChart. We now offer e-Visits for anyone 18 and older to request care online for non-urgent symptoms. For details visit [email or web address].   Also download the MyChart app! Go to the app store, search "MyChart", open the app, select [facility], and log in with your MyChart username and password.  

## 2023-06-05 NOTE — Telephone Encounter (Signed)
-----   Message from Vesta Mixer, MD sent at 06/05/2023 11:32 AM EDT ----- Christena Flake you please start [name] on Eliquis 5 mg PO BID   Thanks  PN   ----- Message ----- From: Malachy Mood, MD Sent: 06/05/2023  11:26 AM EDT To: Vesta Mixer, MD; Lars Mage, RN  Yes, no problem, I will monitor her CBC closely   Terrace Arabia ----- Message ----- From: Vesta Mixer, MD Sent: 06/05/2023  10:41 AM EDT To: Malachy Mood, MD; Lars Mage, RN  Hi Dr. Mosetta Putt,  [name] has had more episodes of PAF. She has a CHAD2VASC score of 3 We have not started Eliquis due to her hx of intermittant thrombocytopenia  I see her platelet counts are back up now  Can we safely start eliquis 5 mg PO BID on her   Delane Ginger  [facility]

## 2023-06-05 NOTE — Telephone Encounter (Signed)
Medication sent to pharmacy on file and pt made aware via MyChart (this is their preferred method of communication)

## 2023-06-05 NOTE — Progress Notes (Signed)
Patient didn't receive Cycle 8, Day 5 on 06/01/23. Orders entered for patient to receive Cycle 8, Day 5 today per MD.  Richardean Sale, RPH, BCPS, BCOP 06/05/2023 8:21 AM

## 2023-06-07 NOTE — Progress Notes (Signed)
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 Mosetta Putt 06/04/2023 -I reviewed her lumbar spine MRI with and without contrast from yesterday, which showed enlarging presacral soft tissue mass, measuring 8 cm now, and degenerative changes.  -I will reach out to Radiation oncology to see if she would benefit from palliative radiation to her presacral mass  - Chemo q6 weeks, 7 cycles complete.   Pain on a scale of 0-10 is: Lower back 4/10, taking tylenol.   Ambulatory status? Walker? Wheelchair?: Ambulatory with a rolling walker.  SAFETY ISSUES: Prior radiation? No Pacemaker/ICD? No Possible current pregnancy? Postmenopausal Is the patient on methotrexate? No   Current Complaints / other details:   Right Chest [facility]

## 2023-06-08 ENCOUNTER — Ambulatory Visit
Admission: RE | Admit: 2023-06-08 | Discharge: 2023-06-08 | Disposition: A | Discharge status: Home / Self Care | Payer: Medicare Other | Source: Ambulatory Visit | Attending: Radiation Oncology | Admitting: Radiation Oncology

## 2023-06-08 ENCOUNTER — Other Ambulatory Visit: Payer: Self-pay

## 2023-06-08 ENCOUNTER — Encounter: Payer: Self-pay | Admitting: Radiation Oncology

## 2023-06-08 VITALS — BP 115/58 | HR 88 | Temp 97.7°F | Resp 18 | Ht 62.0 in | Wt 106.6 lb

## 2023-06-08 DIAGNOSIS — R0602 Shortness of breath: Secondary | ICD-10-CM | POA: Insufficient documentation

## 2023-06-08 DIAGNOSIS — D168 Benign neoplasm of pelvic bones, sacrum and coccyx: Secondary | ICD-10-CM | POA: Diagnosis not present

## 2023-06-08 DIAGNOSIS — Z87891 Personal history of nicotine dependence: Secondary | ICD-10-CM | POA: Insufficient documentation

## 2023-06-08 DIAGNOSIS — C9310 Chronic myelomonocytic leukemia not having achieved remission: Secondary | ICD-10-CM | POA: Insufficient documentation

## 2023-06-08 DIAGNOSIS — R232 Flushing: Secondary | ICD-10-CM | POA: Insufficient documentation

## 2023-06-08 DIAGNOSIS — M48061 Spinal stenosis, lumbar region without neurogenic claudication: Secondary | ICD-10-CM | POA: Insufficient documentation

## 2023-06-08 DIAGNOSIS — I4891 Unspecified atrial fibrillation: Secondary | ICD-10-CM | POA: Insufficient documentation

## 2023-06-08 DIAGNOSIS — Z510 Encounter for antineoplastic radiation therapy: Secondary | ICD-10-CM | POA: Diagnosis not present

## 2023-06-08 DIAGNOSIS — Z7952 Long term (current) use of systemic steroids: Secondary | ICD-10-CM | POA: Insufficient documentation

## 2023-06-08 DIAGNOSIS — M069 Rheumatoid arthritis, unspecified: Secondary | ICD-10-CM | POA: Diagnosis not present

## 2023-06-08 DIAGNOSIS — N183 Chronic kidney disease, stage 3 unspecified: Secondary | ICD-10-CM | POA: Diagnosis not present

## 2023-06-08 DIAGNOSIS — J449 Chronic obstructive pulmonary disease, unspecified: Secondary | ICD-10-CM | POA: Insufficient documentation

## 2023-06-08 DIAGNOSIS — Z7901 Long term (current) use of anticoagulants: Secondary | ICD-10-CM | POA: Diagnosis not present

## 2023-06-08 DIAGNOSIS — M5116 Intervertebral disc disorders with radiculopathy, lumbar region: Secondary | ICD-10-CM | POA: Insufficient documentation

## 2023-06-08 DIAGNOSIS — Z79899 Other long term (current) drug therapy: Secondary | ICD-10-CM | POA: Insufficient documentation

## 2023-06-08 DIAGNOSIS — D509 Iron deficiency anemia, unspecified: Secondary | ICD-10-CM | POA: Insufficient documentation

## 2023-06-08 DIAGNOSIS — I10 Essential (primary) hypertension: Secondary | ICD-10-CM | POA: Diagnosis not present

## 2023-06-08 DIAGNOSIS — I700 Atherosclerosis of aorta: Secondary | ICD-10-CM | POA: Insufficient documentation

## 2023-06-08 NOTE — Progress Notes (Signed)
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   HISTORY OF PRESENT ILLNESS: [name] is a 77 y.o. female seen at the request of Dr. 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 Patient was originally diagnosed in November of 2022.She began azacitadine therapy in February 2023. Since then she had multiple hospital admissions and her performance status was very low. Dr. Mosetta Putt switched her chemo to every 6 weeks in September of 2023 and she has continued to tolerate this new regimen well.   Most recently, she developed worsening low back pain which prompted follow-up imaging. Lumbar MRI on 06/03/23 revealed an enlarging presacral soft tissue mass, measuring 8 cm, and degenerative changes. This mass was seen on previous imaging dating back to 2016, but it has at least doubled in size since then.   Patient has reviewed these imaging results with Dr. Mosetta Putt. We were kindly referred to today to discuss radiation therapy to the enlarging presacral mass.    PREVIOUS RADIATION THERAPY: No   PAST MEDICAL HISTORY:  Past Medical History:  Diagnosis Date   Arthritis    Rheumatoid arthritis   Celiac disease    Chronic kidney disease    stage 3 from MD notes   COPD (chronic obstructive pulmonary disease) (HCC)    Dyspnea    with going up stairs   Family history of adverse reaction to anesthesia    father had hard time waking up   Headache    sinus headaches   Hot flashes    Hypertension    Iron deficiency anemia    Pneumonia    per patient "I have walking pneumonia"       PAST SURGICAL HISTORY: Past Surgical History:  Procedure  Laterality Date   COLONOSCOPY     ECTOPIC PREGNANCY SURGERY      x 2   IR IMAGING GUIDED PORT INSERTION  01/31/2022   REVERSE SHOULDER ARTHROPLASTY Right 02/02/2017   Procedure: RIGHT REVERSE SHOULDER ARTHROPLASTY;  Surgeon: Beverely Low, MD;  Location: [facility] OR;  Service: Orthopedics;  Laterality: Right;     FAMILY HISTORY:  Family History  Problem Relation Age of Onset   Peripheral Artery Disease Father    Diabetes Father      SOCIAL HISTORY:  reports that she quit smoking about 19 years ago. Her smoking use included cigarettes. She has a 30.00 pack-year smoking history. She has never used smokeless tobacco. She reports current alcohol use of about 12.0 - 14.0 standard drinks of alcohol per week. She reports that she does not use drugs.   ALLERGIES: Digoxin and related, Gluten meal, Penicillins, Fexofenadine, Alendronate, and Hydroxychloroquine   MEDICATIONS:  Current Outpatient Medications  Medication Sig Dispense Refill   acetaminophen-codeine (TYLENOL #3) 300-30 MG tablet Take 1 tablet by mouth every 6 (six) hours as needed for moderate pain. 20 tablet 0   apixaban (ELIQUIS) 5 MG TABS tablet Take 1 tablet (5 mg total) by mouth 2 (two) times daily. 60 tablet 11   denosumab (PROLIA) 60 MG/ML SOSY injection Inject 60 mg into the skin every 6 (six) months.     diltiazem (CARDIZEM CD) 360  MG 24 hr capsule Take 1 capsule (360 mg total) by mouth daily. 90 capsule 3   furosemide (LASIX) 20 MG tablet Take 1 tablet (20 mg total) by mouth daily. 30 tablet 11   gabapentin (NEURONTIN) 300 MG capsule Take 300 mg by mouth in the morning.     Metoprolol Tartrate 75 MG TABS Take 150 mg by mouth 2 (two) times daily. 360 tablet 3   potassium chloride SA (KLOR-CON M20) 20 MEQ tablet Take 1 tablet (20 mEq total) by mouth daily. Take with furosemide 90 tablet 3   predniSONE (DELTASONE) 5 MG tablet Take 5 mg by mouth daily with breakfast.     Tiotropium Bromide-Olodaterol (STIOLTO RESPIMAT) 2.5-2.5  MCG/ACT AERS Inhale 2 puffs into the lungs daily. 4 g 11   No current facility-administered medications for this encounter.   Facility-Administered Medications Ordered in Other Encounters  Medication Dose Route Frequency Provider Last Rate Last Admin   acetaminophen (TYLENOL) 325 MG tablet            diphenhydrAMINE (BENADRYL) 25 mg capsule              REVIEW OF SYSTEMS: On review of systems, the patient reports a history of lower back pain. The pain has continued to worsen. Tylenol is no longer providing adequate relief for her. The pain does not radiate. She denies numbness/weakness in her legs.      PHYSICAL EXAM:  Wt Readings from Last 3 Encounters:  06/08/23 106 lb 9.6 oz (48.4 kg)  06/05/23 106 lb 12.8 oz (48.4 kg)  06/01/23 108 lb (49 kg)   Temp Readings from Last 3 Encounters:  06/08/23 97.7 F (36.5 C)  06/05/23 98 F (36.7 C) (Oral)  06/01/23 97.9 F (36.6 C) (Oral)   BP Readings from Last 3 Encounters:  06/08/23 (!) 115/58  06/05/23 117/60  06/01/23 127/82   Pulse Readings from Last 3 Encounters:  06/08/23 88  06/05/23 74  06/01/23 98   Pain Assessment Pain Score: 4  Pain Loc: Back (Lower)/10  In general this is a well appearing female in no acute distress. She's alert and oriented x4 and appropriate throughout the examination. Cardiopulmonary assessment is negative for acute distress and she exhibits normal effort. She is using a walker for ambulation.     ECOG = 1  0 - Asymptomatic (Fully active, able to carry on all predisease activities without restriction)  1 - Symptomatic but completely ambulatory (Restricted in physically strenuous activity but ambulatory and able to carry out work of a light or sedentary nature. For example, light housework, office work)  2 - Symptomatic, <50% in bed during the day (Ambulatory and capable of all self care but unable to carry out any work activities. Up and about more than 50% of waking hours)  3 -  Symptomatic, >50% in bed, but not bedbound (Capable of only limited self-care, confined to bed or chair 50% or more of waking hours)  4 - Bedbound (Completely disabled. Cannot carry on any self-care. Totally confined to bed or chair)  5 - Death   Santiago Glad MM, Creech RH, Tormey DC, et al. [phone]). "Toxicity and response criteria of the [facility]". Am. Evlyn Clines. Oncol. 5 (6): 649-55    LABORATORY DATA:  Lab Results  Component Value Date   WBC 12.8 (H) 06/01/2023   HGB 11.3 (L) 06/01/2023   HCT 35.0 (L) 06/01/2023   MCV 104.8 (H) 06/01/2023   PLT 195 06/01/2023   Lab Results  Component Value Date   NA 136 06/01/2023   K 3.7 06/01/2023   CL 99 06/01/2023   CO2 27 06/01/2023   Lab Results  Component Value Date   ALT 19 05/28/2023   AST 23 05/28/2023   ALKPHOS 54 05/28/2023   BILITOT 0.7 05/28/2023      RADIOGRAPHY: MR Lumbar Spine W Wo Contrast  Result Date: 06/03/2023 CLINICAL DATA:  Low back pain with history of prior surgery. Presacral mass on prior imaging. EXAM: MRI LUMBAR SPINE WITHOUT AND WITH CONTRAST TECHNIQUE: Multiplanar and multiecho pulse sequences of the lumbar spine were obtained without and with intravenous contrast. CONTRAST:  5mL GADAVIST GADOBUTROL 1 MMOL/ML IV SOLN COMPARISON:  07/07/2022 FINDINGS: Segmentation:  Standard. Alignment: Dextroscoliosis. Right lateral translation at L3-4. Mild L4-5 anterolisthesis. Vertebrae:  No fracture, evidence of discitis, or bone lesion. Conus medullaris and cauda equina: Conus extends to the L1 level. Conus and cauda equina appear normal. Paraspinal and other soft tissues: Presacral mass which is partially covered. The mass measures at least 8 cm craniocaudal and 4.6 cm in thickness. Even less coverage on comparison from 2023. The mass is long-standing with visualization since at least 2016, at least doubled in size since that time. Some internal fat was confirmed on a 2016 abdominal CT, although less  pronounced today. Mass could reflect a myelolipoma, low-grade sarcoma, or teratoma. Disc levels: T12- L1: Unremarkable. L1-L2: Small central protrusion. L2-L3: Disc narrowing and bulging with left paracentral extrusion. Degenerative facet spurring. Left subarticular recess stenosis with left L3 impingement. L3-L4: The disc is narrowed and bulging with endplate and facet spurring. Moderate right foraminal stenosis L4-L5: Disc collapse with endplate and facet spurring eccentric to the right. Advanced right foraminal stenosis. Right subarticular recess narrowing that could affect the right L5 nerve root L5-S1:Disc narrowing with endplate and facet spurring. Moderate right foraminal narrowing. IMPRESSION: 1. Long-standing presacral mass, at least doubled in size since 2016, now at least 8 cm in length. There is internal fat components and differential considerations include myelolipoma, liposarcoma, and teratoma. 2. 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 3. L2-3 left paracentral extrusion compressing the left L3 nerve root. 4. Right foraminal impingement at L3-4 to L4-5, greatest at L4-5. Right subarticular recess narrowing at L3-4 and L4-5. Electronically Signed   By: Tiburcio Pea M.D.   On: 06/03/2023 11:28   DG Chest Port 1 View  Result Date: 06/01/2023 CLINICAL DATA:  Atrial fibrillation. EXAM: PORTABLE CHEST 1 VIEW COMPARISON:  01/05/2023 FINDINGS: Mild patient rotation. Mild cardiomegaly. Unchanged mediastinal contours. Aortic atherosclerosis. Accessed right chest port with tip in the SVC. No pulmonary edema, pleural effusion or pneumothorax. Right proximal humeral hardware. IMPRESSION: Mild cardiomegaly. No acute chest findings. Electronically Signed   By: Narda Rutherford M.D.   On: 06/01/2023 15:27       IMPRESSION/PLAN: 1. Presacral soft tissue mass, secondary from CMML  Recent MRI shows enlarging, presacral mass that has almost doubled in size since it was  originally visualized in 2016. She is starting to experience worsening pain to this area that is no longer alleviated with Tylenol. Dr. Mitzi Hansen agrees that she is good candidate for palliative radiation therapy to the visualized mass to decrease the risk of disease progression and to alleviate her pain.  Today we discussed the nature of soft tissue masses from CMML and the role radiotherapy plays in treatment. We discussed the logistics of radiation as well as the risks, benefits, and side effects to treatment. She understands that  the side effects would likely include fatigue, and possible nausea, diarrhea, or appetite changes. She expressed a good understanding of the treatment which is of palliative intent. No guarantees of treatment were given. Patient expressed readiness to proceed today.  A consent for was signed and placed in her chart. She is scheduled for CT simulation later this afternoon. Dr. Mitzi Hansen anticipates a 2 week course of palliative radiation to the visualized presacral mass.   In a visit lasting 60 minutes, greater than 50% of the time was spent face to face discussing the patient's condition, in preparation for the discussion, and coordinating the patient's care.   The above documentation reflects my direct findings during this shared patient visit. Please see the separate note by Dr. Mitzi Hansen on this date for the remainder of the patient's plan of care.    Joyice Faster, PA-C    **Disclaimer: This note was dictated with voice recognition software. Similar sounding words can inadvertently be transcribed and this note may contain transcription errors which may not have been corrected upon publication of note.**

## 2023-06-14 DIAGNOSIS — Z510 Encounter for antineoplastic radiation therapy: Secondary | ICD-10-CM | POA: Diagnosis not present

## 2023-06-14 DIAGNOSIS — Z87891 Personal history of nicotine dependence: Secondary | ICD-10-CM | POA: Diagnosis not present

## 2023-06-14 DIAGNOSIS — D168 Benign neoplasm of pelvic bones, sacrum and coccyx: Secondary | ICD-10-CM | POA: Diagnosis not present

## 2023-06-14 DIAGNOSIS — C9310 Chronic myelomonocytic leukemia not having achieved remission: Secondary | ICD-10-CM | POA: Diagnosis not present

## 2023-06-18 ENCOUNTER — Ambulatory Visit
Admission: RE | Admit: 2023-06-18 | Discharge: 2023-06-18 | Disposition: A | Discharge status: Home / Self Care | Payer: Medicare Other | Source: Ambulatory Visit | Attending: Radiation Oncology | Admitting: Radiation Oncology

## 2023-06-18 ENCOUNTER — Inpatient Hospital Stay: Payer: Medicare Other

## 2023-06-18 ENCOUNTER — Other Ambulatory Visit: Payer: Self-pay

## 2023-06-18 DIAGNOSIS — Z95828 Presence of other vascular implants and grafts: Secondary | ICD-10-CM

## 2023-06-18 DIAGNOSIS — C9310 Chronic myelomonocytic leukemia not having achieved remission: Secondary | ICD-10-CM | POA: Diagnosis not present

## 2023-06-18 DIAGNOSIS — Z510 Encounter for antineoplastic radiation therapy: Secondary | ICD-10-CM | POA: Diagnosis not present

## 2023-06-18 DIAGNOSIS — D168 Benign neoplasm of pelvic bones, sacrum and coccyx: Secondary | ICD-10-CM | POA: Diagnosis not present

## 2023-06-18 DIAGNOSIS — Z87891 Personal history of nicotine dependence: Secondary | ICD-10-CM | POA: Diagnosis not present

## 2023-06-18 LAB — CMP (CANCER CENTER ONLY)
ALT: 19 U/L (ref 0–44)
AST: 22 U/L (ref 15–41)
Albumin: 4.1 g/dL (ref 3.5–5.0)
Alkaline Phosphatase: 57 U/L (ref 38–126)
Anion gap: 8 (ref 5–15)
BUN: 24 mg/dL — ABNORMAL HIGH (ref 8–23)
CO2: 27 mmol/L (ref 22–32)
Calcium: 10.1 mg/dL (ref 8.9–10.3)
Chloride: 102 mmol/L (ref 98–111)
Creatinine: 0.97 mg/dL (ref 0.44–1.00)
GFR, Estimated: >60 mL/min (ref >60)
Glucose, Bld: 118 mg/dL — ABNORMAL HIGH (ref 70–99)
Potassium: 4.4 mmol/L (ref 3.5–5.1)
Sodium: 137 mmol/L (ref 135–145)
Total Bilirubin: 0.7 mg/dL (ref 0.3–1.2)
Total Protein: 6.1 g/dL — ABNORMAL LOW (ref 6.5–8.1)

## 2023-06-18 LAB — CBC WITH DIFFERENTIAL (CANCER CENTER ONLY)
Abs Immature Granulocytes: 0.06 10*3/uL (ref 0.00–0.07)
Basophils Absolute: 0.1 10*3/uL (ref 0.0–0.1)
Basophils Relative: 1 %
Eosinophils Absolute: 0.1 10*3/uL (ref 0.0–0.5)
Eosinophils Relative: 2 %
HCT: 30.7 % — ABNORMAL LOW (ref 36.0–46.0)
Hemoglobin: 10.1 g/dL — ABNORMAL LOW (ref 12.0–15.0)
Immature Granulocytes: 1 %
Lymphocytes Relative: 18 %
Lymphs Abs: 1.6 10*3/uL (ref 0.7–4.0)
MCH: 35.1 pg — ABNORMAL HIGH (ref 26.0–34.0)
MCHC: 32.9 g/dL (ref 30.0–36.0)
MCV: 106.6 fL — ABNORMAL HIGH (ref 80.0–100.0)
Monocytes Absolute: 1.3 10*3/uL — ABNORMAL HIGH (ref 0.1–1.0)
Monocytes Relative: 15 %
Neutro Abs: 5.5 10*3/uL (ref 1.7–7.7)
Neutrophils Relative %: 63 %
Platelet Count: 105 10*3/uL — ABNORMAL LOW (ref 150–400)
RBC: 2.88 MIL/uL — ABNORMAL LOW (ref 3.87–5.11)
RDW: 16 % — ABNORMAL HIGH (ref 11.5–15.5)
WBC Count: 8.5 10*3/uL (ref 4.0–10.5)
nRBC: 0 % (ref 0.0–0.2)

## 2023-06-18 LAB — RAD ONC ARIA SESSION SUMMARY
Course Elapsed Days: 0
Plan Fractions Treated to Date: 1
Plan Prescribed Dose Per Fraction: 3 Gy
Plan Total Fractions Prescribed: 10
Plan Total Prescribed Dose: 30 Gy
Reference Point Dosage Given to Date: 3 Gy
Reference Point Session Dosage Given: 3 Gy
Session Number: 1

## 2023-06-18 MED ORDER — SODIUM CHLORIDE 0.9% FLUSH
10.0000 mL | Freq: Once | INTRAVENOUS | Status: AC
  Administered 2023-06-18: 10 mL

## 2023-06-18 MED ORDER — HEPARIN SOD (PORK) LOCK FLUSH 100 UNIT/ML IV SOLN
500.0000 [IU] | Freq: Once | INTRAVENOUS | Status: AC
  Administered 2023-06-18: 500 [IU]

## 2023-06-19 ENCOUNTER — Other Ambulatory Visit: Payer: Self-pay

## 2023-06-19 ENCOUNTER — Ambulatory Visit
Admission: RE | Admit: 2023-06-19 | Discharge: 2023-06-19 | Disposition: A | Discharge status: Home / Self Care | Payer: Medicare Other | Source: Ambulatory Visit | Attending: Radiation Oncology | Admitting: Radiation Oncology

## 2023-06-19 DIAGNOSIS — D168 Benign neoplasm of pelvic bones, sacrum and coccyx: Secondary | ICD-10-CM | POA: Diagnosis not present

## 2023-06-19 DIAGNOSIS — C9310 Chronic myelomonocytic leukemia not having achieved remission: Secondary | ICD-10-CM | POA: Diagnosis not present

## 2023-06-19 DIAGNOSIS — Z87891 Personal history of nicotine dependence: Secondary | ICD-10-CM | POA: Diagnosis not present

## 2023-06-19 DIAGNOSIS — Z510 Encounter for antineoplastic radiation therapy: Secondary | ICD-10-CM | POA: Diagnosis not present

## 2023-06-19 LAB — RAD ONC ARIA SESSION SUMMARY
Course Elapsed Days: 1
Plan Fractions Treated to Date: 2
Plan Prescribed Dose Per Fraction: 3 Gy
Plan Total Fractions Prescribed: 10
Plan Total Prescribed Dose: 30 Gy
Reference Point Dosage Given to Date: 6 Gy
Reference Point Session Dosage Given: 3 Gy
Session Number: 2

## 2023-06-20 ENCOUNTER — Ambulatory Visit
Admission: RE | Admit: 2023-06-20 | Discharge: 2023-06-20 | Disposition: A | Discharge status: Home / Self Care | Payer: Medicare Other | Source: Ambulatory Visit | Attending: Radiation Oncology | Admitting: Radiation Oncology

## 2023-06-20 ENCOUNTER — Other Ambulatory Visit: Payer: Self-pay

## 2023-06-20 DIAGNOSIS — Z510 Encounter for antineoplastic radiation therapy: Secondary | ICD-10-CM | POA: Diagnosis not present

## 2023-06-20 DIAGNOSIS — C9310 Chronic myelomonocytic leukemia not having achieved remission: Secondary | ICD-10-CM | POA: Diagnosis not present

## 2023-06-20 DIAGNOSIS — Z87891 Personal history of nicotine dependence: Secondary | ICD-10-CM | POA: Diagnosis not present

## 2023-06-20 DIAGNOSIS — D168 Benign neoplasm of pelvic bones, sacrum and coccyx: Secondary | ICD-10-CM | POA: Diagnosis not present

## 2023-06-20 LAB — RAD ONC ARIA SESSION SUMMARY
Course Elapsed Days: 2
Plan Fractions Treated to Date: 3
Plan Prescribed Dose Per Fraction: 3 Gy
Plan Total Fractions Prescribed: 10
Plan Total Prescribed Dose: 30 Gy
Reference Point Dosage Given to Date: 9 Gy
Reference Point Session Dosage Given: 3 Gy
Session Number: 3

## 2023-06-21 ENCOUNTER — Other Ambulatory Visit: Payer: Self-pay

## 2023-06-21 ENCOUNTER — Ambulatory Visit
Admission: RE | Admit: 2023-06-21 | Discharge: 2023-06-21 | Disposition: A | Discharge status: Home / Self Care | Payer: Medicare Other | Source: Ambulatory Visit | Attending: Radiation Oncology | Admitting: Radiation Oncology

## 2023-06-21 DIAGNOSIS — C9310 Chronic myelomonocytic leukemia not having achieved remission: Secondary | ICD-10-CM | POA: Diagnosis not present

## 2023-06-21 DIAGNOSIS — Z87891 Personal history of nicotine dependence: Secondary | ICD-10-CM | POA: Diagnosis not present

## 2023-06-21 DIAGNOSIS — D168 Benign neoplasm of pelvic bones, sacrum and coccyx: Secondary | ICD-10-CM | POA: Diagnosis not present

## 2023-06-21 DIAGNOSIS — Z510 Encounter for antineoplastic radiation therapy: Secondary | ICD-10-CM | POA: Diagnosis not present

## 2023-06-21 LAB — RAD ONC ARIA SESSION SUMMARY
Course Elapsed Days: 3
Plan Fractions Treated to Date: 4
Plan Prescribed Dose Per Fraction: 3 Gy
Plan Total Fractions Prescribed: 10
Plan Total Prescribed Dose: 30 Gy
Reference Point Dosage Given to Date: 12 Gy
Reference Point Session Dosage Given: 3 Gy
Session Number: 4

## 2023-06-22 ENCOUNTER — Other Ambulatory Visit: Payer: Self-pay

## 2023-06-22 ENCOUNTER — Ambulatory Visit
Admission: RE | Admit: 2023-06-22 | Discharge: 2023-06-22 | Disposition: A | Discharge status: Home / Self Care | Payer: Medicare Other | Source: Ambulatory Visit | Attending: Radiation Oncology | Admitting: Radiation Oncology

## 2023-06-22 DIAGNOSIS — C9310 Chronic myelomonocytic leukemia not having achieved remission: Secondary | ICD-10-CM | POA: Diagnosis not present

## 2023-06-22 DIAGNOSIS — Z510 Encounter for antineoplastic radiation therapy: Secondary | ICD-10-CM | POA: Diagnosis not present

## 2023-06-22 DIAGNOSIS — D168 Benign neoplasm of pelvic bones, sacrum and coccyx: Secondary | ICD-10-CM | POA: Diagnosis not present

## 2023-06-22 DIAGNOSIS — Z87891 Personal history of nicotine dependence: Secondary | ICD-10-CM | POA: Diagnosis not present

## 2023-06-22 LAB — RAD ONC ARIA SESSION SUMMARY
Course Elapsed Days: 4
Plan Fractions Treated to Date: 5
Plan Prescribed Dose Per Fraction: 3 Gy
Plan Total Fractions Prescribed: 10
Plan Total Prescribed Dose: 30 Gy
Reference Point Dosage Given to Date: 15 Gy
Reference Point Session Dosage Given: 3 Gy
Session Number: 5

## 2023-06-25 ENCOUNTER — Other Ambulatory Visit: Payer: Self-pay

## 2023-06-25 ENCOUNTER — Ambulatory Visit
Admission: RE | Admit: 2023-06-25 | Discharge: 2023-06-25 | Disposition: A | Discharge status: Home / Self Care | Payer: Medicare Other | Source: Ambulatory Visit | Attending: Radiation Oncology | Admitting: Radiation Oncology

## 2023-06-25 DIAGNOSIS — C9310 Chronic myelomonocytic leukemia not having achieved remission: Secondary | ICD-10-CM | POA: Diagnosis not present

## 2023-06-25 DIAGNOSIS — Z87891 Personal history of nicotine dependence: Secondary | ICD-10-CM | POA: Insufficient documentation

## 2023-06-25 DIAGNOSIS — D168 Benign neoplasm of pelvic bones, sacrum and coccyx: Secondary | ICD-10-CM | POA: Diagnosis not present

## 2023-06-25 DIAGNOSIS — Z510 Encounter for antineoplastic radiation therapy: Secondary | ICD-10-CM | POA: Diagnosis not present

## 2023-06-25 LAB — RAD ONC ARIA SESSION SUMMARY
Course Elapsed Days: 7
Plan Fractions Treated to Date: 6
Plan Prescribed Dose Per Fraction: 3 Gy
Plan Total Fractions Prescribed: 10
Plan Total Prescribed Dose: 30 Gy
Reference Point Dosage Given to Date: 18 Gy
Reference Point Session Dosage Given: 3 Gy
Session Number: 6

## 2023-06-26 ENCOUNTER — Ambulatory Visit
Admission: RE | Admit: 2023-06-26 | Discharge: 2023-06-26 | Disposition: A | Discharge status: Home / Self Care | Payer: Medicare Other | Source: Ambulatory Visit | Attending: Radiation Oncology | Admitting: Radiation Oncology

## 2023-06-26 ENCOUNTER — Other Ambulatory Visit: Payer: Self-pay

## 2023-06-26 DIAGNOSIS — C9310 Chronic myelomonocytic leukemia not having achieved remission: Secondary | ICD-10-CM | POA: Diagnosis not present

## 2023-06-26 DIAGNOSIS — D168 Benign neoplasm of pelvic bones, sacrum and coccyx: Secondary | ICD-10-CM | POA: Diagnosis not present

## 2023-06-26 DIAGNOSIS — Z510 Encounter for antineoplastic radiation therapy: Secondary | ICD-10-CM | POA: Diagnosis not present

## 2023-06-26 DIAGNOSIS — Z87891 Personal history of nicotine dependence: Secondary | ICD-10-CM | POA: Diagnosis not present

## 2023-06-26 LAB — RAD ONC ARIA SESSION SUMMARY
Course Elapsed Days: 8
Plan Fractions Treated to Date: 7
Plan Prescribed Dose Per Fraction: 3 Gy
Plan Total Fractions Prescribed: 10
Plan Total Prescribed Dose: 30 Gy
Reference Point Dosage Given to Date: 21 Gy
Reference Point Session Dosage Given: 3 Gy
Session Number: 7

## 2023-06-27 ENCOUNTER — Other Ambulatory Visit: Payer: Self-pay

## 2023-06-27 ENCOUNTER — Ambulatory Visit
Admission: RE | Admit: 2023-06-27 | Discharge: 2023-06-27 | Disposition: A | Discharge status: Home / Self Care | Payer: Medicare Other | Source: Ambulatory Visit | Attending: Radiation Oncology | Admitting: Radiation Oncology

## 2023-06-27 DIAGNOSIS — Z510 Encounter for antineoplastic radiation therapy: Secondary | ICD-10-CM | POA: Diagnosis not present

## 2023-06-27 DIAGNOSIS — D168 Benign neoplasm of pelvic bones, sacrum and coccyx: Secondary | ICD-10-CM | POA: Diagnosis not present

## 2023-06-27 DIAGNOSIS — C9310 Chronic myelomonocytic leukemia not having achieved remission: Secondary | ICD-10-CM | POA: Diagnosis not present

## 2023-06-27 DIAGNOSIS — Z87891 Personal history of nicotine dependence: Secondary | ICD-10-CM | POA: Diagnosis not present

## 2023-06-27 LAB — RAD ONC ARIA SESSION SUMMARY
Course Elapsed Days: 9
Plan Fractions Treated to Date: 8
Plan Prescribed Dose Per Fraction: 3 Gy
Plan Total Fractions Prescribed: 10
Plan Total Prescribed Dose: 30 Gy
Reference Point Dosage Given to Date: 24 Gy
Reference Point Session Dosage Given: 3 Gy
Session Number: 8

## 2023-06-29 ENCOUNTER — Ambulatory Visit
Admission: RE | Admit: 2023-06-29 | Discharge: 2023-06-29 | Disposition: A | Discharge status: Home / Self Care | Payer: Medicare Other | Source: Ambulatory Visit | Attending: Radiation Oncology | Admitting: Radiation Oncology

## 2023-06-29 ENCOUNTER — Other Ambulatory Visit: Payer: Self-pay

## 2023-06-29 DIAGNOSIS — D168 Benign neoplasm of pelvic bones, sacrum and coccyx: Secondary | ICD-10-CM | POA: Diagnosis not present

## 2023-06-29 DIAGNOSIS — C9310 Chronic myelomonocytic leukemia not having achieved remission: Secondary | ICD-10-CM | POA: Diagnosis not present

## 2023-06-29 DIAGNOSIS — Z87891 Personal history of nicotine dependence: Secondary | ICD-10-CM | POA: Diagnosis not present

## 2023-06-29 DIAGNOSIS — Z510 Encounter for antineoplastic radiation therapy: Secondary | ICD-10-CM | POA: Diagnosis not present

## 2023-06-29 LAB — RAD ONC ARIA SESSION SUMMARY
Course Elapsed Days: 11
Plan Fractions Treated to Date: 9
Plan Prescribed Dose Per Fraction: 3 Gy
Plan Total Fractions Prescribed: 10
Plan Total Prescribed Dose: 30 Gy
Reference Point Dosage Given to Date: 27 Gy
Reference Point Session Dosage Given: 3 Gy
Session Number: 9

## 2023-07-02 ENCOUNTER — Ambulatory Visit
Admission: RE | Admit: 2023-07-02 | Discharge: 2023-07-02 | Disposition: A | Discharge status: Home / Self Care | Payer: Medicare Other | Source: Ambulatory Visit | Attending: Radiation Oncology | Admitting: Radiation Oncology

## 2023-07-02 ENCOUNTER — Other Ambulatory Visit: Payer: Self-pay

## 2023-07-02 DIAGNOSIS — D168 Benign neoplasm of pelvic bones, sacrum and coccyx: Secondary | ICD-10-CM | POA: Diagnosis not present

## 2023-07-02 DIAGNOSIS — C9310 Chronic myelomonocytic leukemia not having achieved remission: Secondary | ICD-10-CM | POA: Diagnosis not present

## 2023-07-02 DIAGNOSIS — Z87891 Personal history of nicotine dependence: Secondary | ICD-10-CM | POA: Diagnosis not present

## 2023-07-02 DIAGNOSIS — Z510 Encounter for antineoplastic radiation therapy: Secondary | ICD-10-CM | POA: Diagnosis not present

## 2023-07-02 LAB — RAD ONC ARIA SESSION SUMMARY
Course Elapsed Days: 14
Plan Fractions Treated to Date: 10
Plan Prescribed Dose Per Fraction: 3 Gy
Plan Total Fractions Prescribed: 10
Plan Total Prescribed Dose: 30 Gy
Reference Point Dosage Given to Date: 30 Gy
Reference Point Session Dosage Given: 3 Gy
Session Number: 10

## 2023-07-04 NOTE — Radiation Completion Notes (Addendum)
  Radiation Oncology         [phone] ________________________________  Name: [name] MRN: [id number]  Date of Service: 07/02/2023  DOB: 03-09-1946  End of Treatment Note  Diagnosis: Chronic Myelomonocytic Leukemia   Intent: Palliative     ==========DELIVERED PLANS==========  First Treatment Date: 2023-06-18 - Last Treatment Date: 2023-07-02   Plan Name: Pelvis Site: Pelvis Technique: 3D Mode: Photon Dose Per Fraction: 3 Gy Prescribed Dose (Delivered / Prescribed): 30 Gy / 30 Gy Prescribed Fxs (Delivered / Prescribed): 10 / 10     ==========ON TREATMENT VISIT DATES========== 2023-06-22, 2023-06-29   See weekly On Treatment Notes in Epic for details. The patient tolerated radiation. She developed fatigue and loose stool during treatment.  The patient will receive a call in about one month from the radiation oncology department. She will continue follow up with Dr. Mosetta Putt as well.      Osker Mason, PAC

## 2023-07-08 NOTE — Assessment & Plan Note (Deleted)
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  -She began azacitadine on 02/06/22. she has had multiple hospital admissions and her performance status has been very low.  Changed her chemo to every 6 weeks in 08/2022 due to her fatigue. -she is tolerating every 6 weeks much better, feels better and able to do all her ADL now -she saw Dr. Sharyne Richters in Feb 2024, who recommends to continue current therapy  -Due to worsening low back pain, she received palliative radiation to CMML mass in presacral area from June 14 to 07/02/2023

## 2023-07-09 ENCOUNTER — Emergency Department (HOSPITAL_COMMUNITY)
Admission: EM | Admit: 2023-07-09 | Discharge: 2023-07-09 | Disposition: A | Discharge status: Home / Self Care | Payer: Medicare Other | Attending: Emergency Medicine | Admitting: Emergency Medicine

## 2023-07-09 ENCOUNTER — Encounter: Payer: Self-pay | Admitting: Hematology

## 2023-07-09 ENCOUNTER — Telehealth: Payer: Self-pay

## 2023-07-09 ENCOUNTER — Inpatient Hospital Stay: Payer: Medicare Other

## 2023-07-09 ENCOUNTER — Emergency Department (HOSPITAL_COMMUNITY): Payer: Medicare Other

## 2023-07-09 ENCOUNTER — Encounter (HOSPITAL_COMMUNITY): Payer: Self-pay

## 2023-07-09 ENCOUNTER — Other Ambulatory Visit: Payer: Self-pay

## 2023-07-09 ENCOUNTER — Inpatient Hospital Stay: Payer: Medicare Other | Admitting: Hematology

## 2023-07-09 ENCOUNTER — Other Ambulatory Visit: Payer: Self-pay | Admitting: Hematology

## 2023-07-09 DIAGNOSIS — K5750 Diverticulosis of both small and large intestine without perforation or abscess without bleeding: Secondary | ICD-10-CM | POA: Diagnosis not present

## 2023-07-09 DIAGNOSIS — C9310 Chronic myelomonocytic leukemia not having achieved remission: Secondary | ICD-10-CM

## 2023-07-09 DIAGNOSIS — R112 Nausea with vomiting, unspecified: Secondary | ICD-10-CM | POA: Diagnosis not present

## 2023-07-09 DIAGNOSIS — I517 Cardiomegaly: Secondary | ICD-10-CM | POA: Diagnosis not present

## 2023-07-09 DIAGNOSIS — K920 Hematemesis: Secondary | ICD-10-CM | POA: Diagnosis not present

## 2023-07-09 DIAGNOSIS — K209 Esophagitis, unspecified without bleeding: Secondary | ICD-10-CM | POA: Insufficient documentation

## 2023-07-09 DIAGNOSIS — Z7901 Long term (current) use of anticoagulants: Secondary | ICD-10-CM | POA: Insufficient documentation

## 2023-07-09 DIAGNOSIS — K8689 Other specified diseases of pancreas: Secondary | ICD-10-CM | POA: Diagnosis not present

## 2023-07-09 DIAGNOSIS — Z96611 Presence of right artificial shoulder joint: Secondary | ICD-10-CM | POA: Diagnosis not present

## 2023-07-09 DIAGNOSIS — R1114 Bilious vomiting: Secondary | ICD-10-CM | POA: Diagnosis not present

## 2023-07-09 DIAGNOSIS — R109 Unspecified abdominal pain: Secondary | ICD-10-CM | POA: Diagnosis not present

## 2023-07-09 LAB — CBC WITH DIFFERENTIAL/PLATELET
Abs Immature Granulocytes: 0.15 10*3/uL — ABNORMAL HIGH (ref 0.00–0.07)
Basophils Absolute: 0.2 10*3/uL — ABNORMAL HIGH (ref 0.0–0.1)
Basophils Relative: 2 %
Eosinophils Absolute: 0.6 10*3/uL — ABNORMAL HIGH (ref 0.0–0.5)
Eosinophils Relative: 5 %
HCT: 31.5 % — ABNORMAL LOW (ref 36.0–46.0)
Hemoglobin: 10.2 g/dL — ABNORMAL LOW (ref 12.0–15.0)
Immature Granulocytes: 1 %
Lymphocytes Relative: 5 %
Lymphs Abs: 0.6 10*3/uL — ABNORMAL LOW (ref 0.7–4.0)
MCH: 34.1 pg — ABNORMAL HIGH (ref 26.0–34.0)
MCHC: 32.4 g/dL (ref 30.0–36.0)
MCV: 105.4 fL — ABNORMAL HIGH (ref 80.0–100.0)
Monocytes Absolute: 4.6 10*3/uL — ABNORMAL HIGH (ref 0.1–1.0)
Monocytes Relative: 40 %
Neutro Abs: 5.5 10*3/uL (ref 1.7–7.7)
Neutrophils Relative %: 47 %
Platelets: 134 10*3/uL — ABNORMAL LOW (ref 150–400)
RBC: 2.99 MIL/uL — ABNORMAL LOW (ref 3.87–5.11)
RDW: 14.9 % (ref 11.5–15.5)
WBC: 11.6 10*3/uL — ABNORMAL HIGH (ref 4.0–10.5)
nRBC: 0 % (ref 0.0–0.2)

## 2023-07-09 LAB — COMPREHENSIVE METABOLIC PANEL
ALT: 12 U/L (ref 0–44)
AST: 16 U/L (ref 15–41)
Albumin: 3.6 g/dL (ref 3.5–5.0)
Alkaline Phosphatase: 49 U/L (ref 38–126)
Anion gap: 10 (ref 5–15)
BUN: 26 mg/dL — ABNORMAL HIGH (ref 8–23)
CO2: 27 mmol/L (ref 22–32)
Calcium: 9.2 mg/dL (ref 8.9–10.3)
Chloride: 95 mmol/L — ABNORMAL LOW (ref 98–111)
Creatinine, Ser: 1.09 mg/dL — ABNORMAL HIGH (ref 0.44–1.00)
GFR, Estimated: 53 mL/min — ABNORMAL LOW (ref >60)
Glucose, Bld: 140 mg/dL — ABNORMAL HIGH (ref 70–99)
Potassium: 3 mmol/L — ABNORMAL LOW (ref 3.5–5.1)
Sodium: 132 mmol/L — ABNORMAL LOW (ref 135–145)
Total Bilirubin: 1 mg/dL (ref 0.3–1.2)
Total Protein: 5.7 g/dL — ABNORMAL LOW (ref 6.5–8.1)

## 2023-07-09 LAB — PROTIME-INR
INR: 2 — ABNORMAL HIGH (ref 0.8–1.2)
Prothrombin Time: 22.7 seconds — ABNORMAL HIGH (ref 11.4–15.2)

## 2023-07-09 LAB — TYPE AND SCREEN
ABO/RH(D): B POS
Antibody Screen: NEGATIVE

## 2023-07-09 MED ORDER — SODIUM CHLORIDE 0.9 % IV BOLUS
1000.0000 mL | Freq: Once | INTRAVENOUS | Status: AC
  Administered 2023-07-09: 1000 mL via INTRAVENOUS

## 2023-07-09 MED ORDER — PANTOPRAZOLE INFUSION (NEW) - SIMPLE MED
8.0000 mg/h | INTRAVENOUS | Status: DC
  Administered 2023-07-09: 8 mg/h via INTRAVENOUS
  Filled 2023-07-09: qty 80

## 2023-07-09 MED ORDER — SODIUM CHLORIDE (PF) 0.9 % IJ SOLN
INTRAMUSCULAR | Status: AC
  Filled 2023-07-09: qty 50

## 2023-07-09 MED ORDER — LIDOCAINE VISCOUS HCL 2 % MT SOLN: 10.0000 mL | Freq: Four times a day (QID) | OROMUCOSAL | 0 refills | Status: AC | PRN

## 2023-07-09 MED ORDER — PANTOPRAZOLE SODIUM 40 MG IV SOLR: 40.0000 mg | Freq: Two times a day (BID) | INTRAVENOUS | Status: DC

## 2023-07-09 MED ORDER — LIDOCAINE VISCOUS HCL 2 % MT SOLN
15.0000 mL | Freq: Once | OROMUCOSAL | Status: AC
  Administered 2023-07-09: 15 mL via OROMUCOSAL
  Filled 2023-07-09: qty 15

## 2023-07-09 MED ORDER — IOHEXOL 300 MG/ML  SOLN
80.0000 mL | Freq: Once | INTRAMUSCULAR | Status: AC | PRN
  Administered 2023-07-09: 80 mL via INTRAVENOUS

## 2023-07-09 MED ORDER — PANTOPRAZOLE SODIUM 20 MG PO TBEC: 20.0000 mg | DELAYED_RELEASE_TABLET | Freq: Every day | ORAL | 0 refills | Status: DC

## 2023-07-09 MED ORDER — HEPARIN SOD (PORK) LOCK FLUSH 100 UNIT/ML IV SOLN
500.0000 [IU] | Freq: Once | INTRAVENOUS | Status: DC
  Filled 2023-07-09: qty 5

## 2023-07-09 MED ORDER — PANTOPRAZOLE 80MG IVPB - SIMPLE MED
80.0000 mg | Freq: Once | INTRAVENOUS | Status: AC
  Administered 2023-07-09: 80 mg via INTRAVENOUS
  Filled 2023-07-09: qty 80

## 2023-07-09 MED ORDER — FLUCONAZOLE 10 MG/ML PO SUSR: 100.0000 mg | Freq: Every day | ORAL | 0 refills | Status: AC

## 2023-07-09 NOTE — ED Provider Notes (Signed)
[place] EMERGENCY DEPARTMENT AT [facility] Provider Note   CSN: [id number] Arrival date & time: 07/09/23  4696     History  Chief Complaint  Patient presents with   Emesis    [name] is a 77 y.o. female.  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  She is on a schedule of chemotherapy every 6 weeks, next dose tomorrow.  She completed 10 courses of radiation therapy 1 week ago today.  About that day she developed intermittent abdominal crampiness and loose stool.  The symptoms persisted for a week, but now over the past 2 days she has had diffuse abdominal bloating, vomiting, and p.o. intolerance.  No fever, no chest pain, no dyspnea.  There is associated weakness.     Home Medications Prior to Admission medications   Medication Sig Start Date End Date Taking? Authorizing Provider  fluconazole (DIFLUCAN) 10 MG/ML suspension Take 10 mLs (100 mg total) by mouth daily for 7 days. 07/09/23 07/16/23 Yes Gerhard Munch, MD  lidocaine (XYLOCAINE) 2 % solution Use as directed 10 mLs in the mouth or throat every 6 (six) hours as needed for up to 5 days for mouth pain. 07/09/23 07/14/23 Yes Gerhard Munch, MD  pantoprazole (PROTONIX) 20 MG tablet Take 1 tablet (20 mg total) by mouth daily. For three weeks 07/09/23  Yes Gerhard Munch, MD  acetaminophen-codeine (TYLENOL #3) 300-30 MG tablet Take 1 tablet by mouth every 6 (six) hours as needed for moderate pain. 05/28/23   Malachy Mood, MD  apixaban (ELIQUIS) 5 MG TABS tablet Take 1 tablet (5 mg total) by mouth 2 (two) times daily. 06/05/23   [name], Deloris Ping, MD  denosumab (PROLIA) 60 MG/ML SOSY injection Inject 60 mg into the skin every 6 (six) months.    [provider]  diltiazem (CARDIZEM CD) 360 MG 24 hr capsule Take 1 capsule (360 mg total) by mouth daily. 06/05/23   [name], Deloris Ping, MD  furosemide (LASIX) 20 MG tablet  Take 1 tablet (20 mg total) by mouth daily. 05/21/22 06/08/23  Erick Blinks, MD  gabapentin (NEURONTIN) 300 MG capsule Take 300 mg by mouth in the morning. 07/21/14   [provider]  Metoprolol Tartrate 75 MG TABS Take 150 mg by mouth 2 (two) times daily. 07/24/22   [name], Deloris Ping, MD  potassium chloride SA (KLOR-CON M20) 20 MEQ tablet Take 1 tablet (20 mEq total) by mouth daily. Take with furosemide 05/22/23   [name], Deloris Ping, MD  predniSONE (DELTASONE) 5 MG tablet Take 5 mg by mouth daily with breakfast. 09/22/21   [provider]  Tiotropium Bromide-Olodaterol (STIOLTO RESPIMAT) 2.5-2.5 MCG/ACT AERS Inhale 2 puffs into the lungs daily. 04/23/23   Josephine Igo, DO      Allergies    Digoxin and related, Gluten meal, Penicillins, Fexofenadine, Alendronate, and Hydroxychloroquine    Review of Systems   Review of Systems  All other systems reviewed and are negative.   Physical Exam Updated Vital Signs BP (!) 86/56   Pulse (!) 112   Temp 98 F (36.7 C)   Resp 16   Ht 5\' 3"  (1.6 m)   Wt 47.6 kg   SpO2 96%   BMI 18.60 kg/m  Physical Exam Vitals and nursing note reviewed.  Constitutional:      General: She is not in acute distress.    Appearance: She is well-developed. She is ill-appearing. She  is not toxic-appearing.  HENT:     Head: Normocephalic and atraumatic.  Eyes:     Conjunctiva/sclera: Conjunctivae normal.  Cardiovascular:     Rate and Rhythm: Tachycardia present. Rhythm irregular.  Pulmonary:     Effort: Pulmonary effort is normal. No respiratory distress.     Breath sounds: No stridor.  Abdominal:     General: There is no distension.     Tenderness: There is abdominal tenderness. There is no guarding.     Comments: bloating  Skin:    General: Skin is warm and dry.  Neurological:     Mental Status: She is alert and oriented to person, place, and time.     Cranial Nerves: No cranial nerve deficit.  Psychiatric:        Mood and Affect: Mood  normal.     ED Results / Procedures / Treatments   Labs (all labs ordered are listed, but only abnormal results are displayed) Labs Reviewed  COMPREHENSIVE METABOLIC PANEL - Abnormal; Notable for the following components:      Result Value   Sodium 132 (*)    Potassium 3.0 (*)    Chloride 95 (*)    Glucose, Bld 140 (*)    BUN 26 (*)    Creatinine, Ser 1.09 (*)    Total Protein 5.7 (*)    GFR, Estimated 53 (*)    All other components within normal limits  CBC WITH DIFFERENTIAL/PLATELET - Abnormal; Notable for the following components:   WBC 11.6 (*)    RBC 2.99 (*)    Hemoglobin 10.2 (*)    HCT 31.5 (*)    MCV 105.4 (*)    MCH 34.1 (*)    Platelets 134 (*)    Lymphs Abs 0.6 (*)    Monocytes Absolute 4.6 (*)    Eosinophils Absolute 0.6 (*)    Basophils Absolute 0.2 (*)    Abs Immature Granulocytes 0.15 (*)    All other components within normal limits  PROTIME-INR - Abnormal; Notable for the following components:   Prothrombin Time 22.7 (*)    INR 2.0 (*)    All other components within normal limits  TYPE AND SCREEN    EKG None  Radiology CT ABDOMEN PELVIS W CONTRAST  Result Date: 07/09/2023 CLINICAL DATA:  Nonlocalized abdominal pain. Personal history of chronic myelomonocytic leukemia EXAM: CT ABDOMEN AND PELVIS WITH CONTRAST TECHNIQUE: Multidetector CT imaging of the abdomen and pelvis was performed using the standard protocol following bolus administration of intravenous contrast. RADIATION DOSE REDUCTION: This exam was performed according to the departmental dose-optimization program which includes automated exposure control, adjustment of the mA and/or kV according to patient size and/or use of iterative reconstruction technique. CONTRAST:  80mL OMNIPAQUE IOHEXOL 300 MG/ML  SOLN COMPARISON:  CTA abdomen and pelvis 06/19/2022 FINDINGS: Lower chest: 4.4 x 2.1 cm right paraspinal soft tissue lesion on image 1 of series 2 is progressive from 3.1 x 0.9 cm on the 06/19/2022  exam.2.9 x 1.2 cm posterior chest wall lesion on 3/2 is also progressive. Hepatobiliary: Scattered tiny hypodensities in the liver parenchyma are too small to characterize but are statistically most likely benign. No followup imaging is recommended. Trace biliary prominence noted anterior right liver. There is no evidence for gallstones, gallbladder wall thickening, or pericholecystic fluid. Common bile duct measures 7 mm diameter, stable in upper normal for patient age. Pancreas: Diffuse prominence of the main pancreatic duct, unchanged. No pancreatic mass lesion evident. Spleen: No splenomegaly. No suspicious  focal mass lesion. Adrenals/Urinary Tract: No adrenal nodule or mass. 3.5 cm cyst noted lower pole right kidney. No followup imaging is recommended. Tiny well-defined homogeneous low-density lesions in both kidneys are too small to characterize but are statistically most likely benign and probably cysts. No followup imaging is recommended. No evidence for hydroureter. The urinary bladder appears normal for the degree of distention. Stomach/Bowel: Mild circumferential wall thickening noted distal esophagus. Stomach is unremarkable. No gastric wall thickening. No evidence of outlet obstruction. Duodenum is normally positioned as is the ligament of Treitz. Duodenal diverticulum noted. No small bowel wall thickening. No small bowel dilatation. The terminal ileum is normal. The appendix is normal. No gross colonic mass. No colonic wall thickening. Diverticular changes are noted in the left colon without evidence of diverticulitis. Vascular/Lymphatic: There is advanced atherosclerotic calcification of the abdominal aorta without aneurysm. Upper normal lymph nodes are seen in the hepatoduodenal ligament, similar to prior. No retroperitoneal lymphadenopathy. No pelvic sidewall lymphadenopathy. Presacral soft tissue lesions are progressive in the interval. 5.3 x 4.0 cm lesion on 55/2 was 4.3 x 3.4 cm when remeasured  in a similar fashion on the previous exam. More caudally, a mixed attenuation fat and soft tissue density lesion between the rectum and the inferior sacrum measures 7.5 x 5.1 cm today compared to 7.0 x 4.1 cm previously. Reproductive: Unremarkable. Other: No intraperitoneal free fluid. Musculoskeletal: No worrisome lytic or sclerotic osseous abnormality. Degenerative disc disease noted lumbar spine. IMPRESSION: 1. 1 no acute findings in the abdomen or pelvis. Specifically, no findings to explain the patient's history of pain. 2. Interval progression of presacral and right thoracic paraspinal soft tissue lesions compatible with mild progression of disease since 06/19/2022. Small posterior right thoracic chest wall lesion also mildly progressive. 3. Mild circumferential wall thickening distal esophagus. Esophagitis would be a consideration. 4.  Aortic Atherosclerosis (ICD10-I70.0). Electronically Signed   By: Kennith Center M.D.   On: 07/09/2023 10:55   DG Chest Port 1 View  Result Date: 07/09/2023 CLINICAL DATA:  Hematemesis EXAM: PORTABLE CHEST 1 VIEW COMPARISON:  Chest radiograph 06/01/2023 FINDINGS: The right chest wall port is stable with tip terminating in the region of the cavoatrial junction. The cardiomediastinal silhouette is stable with unchanged mild cardiomegaly. There is no focal consolidation or pulmonary edema. There is no pleural effusion or pneumothorax. There is no acute osseous abnormality. Right shoulder arthroplasty hardware is again noted. IMPRESSION: Stable chest with no radiographic evidence of acute cardiopulmonary process. Electronically Signed   By: Lesia Hausen M.D.   On: 07/09/2023 09:14    Procedures Procedures    Medications Ordered in ED Medications  pantoprozole (PROTONIX) 80 mg /NS 100 mL infusion (0 mg/hr Intravenous Stopped 07/09/23 1011)  pantoprazole (PROTONIX) injection 40 mg (has no administration in time range)  sodium chloride 0.9 % bolus 1,000 mL (0 mLs  Intravenous Stopped 07/09/23 1250)  pantoprazole (PROTONIX) 80 mg /NS 100 mL IVPB (0 mg Intravenous Stopped 07/09/23 0950)  iohexol (OMNIPAQUE) 300 MG/ML solution 80 mL (80 mLs Intravenous Contrast Given 07/09/23 1015)  lidocaine (XYLOCAINE) 2 % viscous mouth solution 15 mL (15 mLs Mouth/Throat Given 07/09/23 1131)    ED Course/ Medical Decision Making/ A&P                             Medical Decision Making Patient with multiple medical issues including A-fib, anticoagulated, CML actively receiving chemotherapy, radiation therapy presents with weakness, nausea, vomiting, diarrhea.  Broad differential including obstruction, infection, dehydration, medication effects considered.  Cardiac monitor variable rate A-fib 130s 150s abnormal Pulse ox 99% room air normal   Amount and/or Complexity of Data Reviewed Independent Historian: spouse    Details: At bedside HPI External Data Reviewed: notes.    Details: Radiation therapy notes from last week reviewed Labs: ordered. Decision-making details documented in ED Course. Radiology: ordered and independent interpretation performed. Decision-making details documented in ED Course.  Risk Prescription drug management. Decision regarding hospitalization.   1:52 PM Patient awake, alert, has been tolerant of oral meds, liquids, is feeling better has had no additional episodes of vomiting. I reviewed the patient's CT scan previously, and discussed with her and her husband.  Some evidence for esophagitis, possible mild disease progression, but no obstruction, no diverticulitis. With mild leukocytosis, mild lab abnormalities patient was monitored for hours, had general decrease in her heart rate, improvement in her overall condition, and now she is tolerating p.o. she, and her husband discussed admission versus discharge with next day follow-up with oncology as she was supposed to start chemotherapy today, would like to proceed with that tomorrow.  Admission  is reasonable, but would be prohibitive in terms of starting chemotherapy and the patient would like to do this.  Patient will start appropriate medication for esophagitis, and having had fluid resuscitation here, meds here, will be discharged to follow-up tomorrow with her physician.  Discharge does have mild tachycardia, rate 90s, 105, still in A-fib, and she is taking her anticoagulation.         Final Clinical Impression(s) / ED Diagnoses Final diagnoses:  Bilious vomiting with nausea  Esophagitis    Rx / DC Orders ED Discharge Orders          Ordered    pantoprazole (PROTONIX) 20 MG tablet  Daily        07/09/23 1352    fluconazole (DIFLUCAN) 10 MG/ML suspension  Daily        07/09/23 1352    lidocaine (XYLOCAINE) 2 % solution  Every 6 hours PRN        07/09/23 1352              Gerhard Munch, MD 07/09/23 1352

## 2023-07-09 NOTE — ED Notes (Signed)
Pt discharged home. Discharge information discussed. No s/s of distress observed during discharge. 

## 2023-07-09 NOTE — Progress Notes (Signed)
Per Dr. Mosetta Putt, "OK To use the CBC w/Diff drawn on 07/09/2023 for C9D1 and pt will have CMP redrawn ONLY for C9D1 d/t electrolytes and Scr+ outside tx parameters".

## 2023-07-09 NOTE — Discharge Instructions (Signed)
As discussed, monitor your condition carefully and follow-up with your physician tomorrow.  If you develop new, or concerning changes in the interim, return here.

## 2023-07-09 NOTE — ED Triage Notes (Addendum)
Patient has CMML cancer, doing chemo and radiation. Has been vomiting dark blood since last night. Abdominal pain all over. Diarrhea.

## 2023-07-09 NOTE — Telephone Encounter (Signed)
Pt's husband Carmie Kanner called stating that the pt will be d/c home from the ED today.  [name] stated that the pt was able to eat, drink, swallow, and take her medications w/o complications; therefore, the pt will be d/c home.  [name] stated that the pt has esophageal irritation.  [name] wants to know if Dr. Mosetta Putt would like to reschedule the pt's tx this week since the pt missed today's tx.  Spoke with Dr. Mosetta Putt regarding [name]'s request and "Yes" Dr. Mosetta Putt would like to reschedule the pt's chemo tx this week to next week.  Informed [name] of Dr. Latanya Maudlin response.  Also stated, Dr. Mosetta Putt will redraw the pt's CMP d/t pt's electrolytes are out of normal range.  Instructed [name] to contact Dr. Latanya Maudlin office by Wednesday 07/11/2023, if the pt's pain and esophageal irritation does not get better please contact Dr. Latanya Maudlin office so the pt can be seen this week in clinic by Dr. Mosetta Putt.  [name] verbalized understanding.  Scheduler Patricia Nettle added to [employer] with [employer] Nurse Adela Lank "[employer].

## 2023-07-10 ENCOUNTER — Inpatient Hospital Stay: Payer: Medicare Other

## 2023-07-11 ENCOUNTER — Inpatient Hospital Stay: Payer: Medicare Other

## 2023-07-12 ENCOUNTER — Inpatient Hospital Stay: Payer: Medicare Other

## 2023-07-13 ENCOUNTER — Ambulatory Visit: Payer: Medicare Other

## 2023-07-16 ENCOUNTER — Inpatient Hospital Stay: Payer: Medicare Other | Attending: Nurse Practitioner

## 2023-07-16 ENCOUNTER — Inpatient Hospital Stay: Payer: Medicare Other

## 2023-07-16 ENCOUNTER — Inpatient Hospital Stay (HOSPITAL_BASED_OUTPATIENT_CLINIC_OR_DEPARTMENT_OTHER): Payer: Medicare Other | Admitting: Hematology

## 2023-07-16 ENCOUNTER — Encounter: Payer: Self-pay | Admitting: Hematology

## 2023-07-16 VITALS — BP 109/66 | HR 91 | Temp 97.8°F | Resp 18 | Wt 106.9 lb

## 2023-07-16 DIAGNOSIS — Z5111 Encounter for antineoplastic chemotherapy: Secondary | ICD-10-CM | POA: Insufficient documentation

## 2023-07-16 DIAGNOSIS — C9310 Chronic myelomonocytic leukemia not having achieved remission: Secondary | ICD-10-CM | POA: Insufficient documentation

## 2023-07-16 DIAGNOSIS — Z95828 Presence of other vascular implants and grafts: Secondary | ICD-10-CM

## 2023-07-16 LAB — CBC WITH DIFFERENTIAL (CANCER CENTER ONLY)
Abs Immature Granulocytes: 0.24 10*3/uL — ABNORMAL HIGH (ref 0.00–0.07)
Basophils Absolute: 0 10*3/uL (ref 0.0–0.1)
Basophils Relative: 0 %
Eosinophils Absolute: 0.2 10*3/uL (ref 0.0–0.5)
Eosinophils Relative: 2 %
HCT: 28.6 % — ABNORMAL LOW (ref 36.0–46.0)
Hemoglobin: 9.4 g/dL — ABNORMAL LOW (ref 12.0–15.0)
Immature Granulocytes: 2 %
Lymphocytes Relative: 10 %
Lymphs Abs: 1 10*3/uL (ref 0.7–4.0)
MCH: 34.3 pg — ABNORMAL HIGH (ref 26.0–34.0)
MCHC: 32.9 g/dL (ref 30.0–36.0)
MCV: 104.4 fL — ABNORMAL HIGH (ref 80.0–100.0)
Monocytes Absolute: 1.5 10*3/uL — ABNORMAL HIGH (ref 0.1–1.0)
Monocytes Relative: 15 %
Neutro Abs: 7.4 10*3/uL (ref 1.7–7.7)
Neutrophils Relative %: 71 %
Platelet Count: 138 10*3/uL — ABNORMAL LOW (ref 150–400)
RBC: 2.74 MIL/uL — ABNORMAL LOW (ref 3.87–5.11)
RDW: 14.8 % (ref 11.5–15.5)
WBC Count: 10.4 10*3/uL (ref 4.0–10.5)
nRBC: 0 % (ref 0.0–0.2)

## 2023-07-16 LAB — BASIC METABOLIC PANEL - CANCER CENTER ONLY
Anion gap: 8 (ref 5–15)
BUN: 23 mg/dL (ref 8–23)
CO2: 27 mmol/L (ref 22–32)
Calcium: 9.2 mg/dL (ref 8.9–10.3)
Chloride: 98 mmol/L (ref 98–111)
Creatinine: 0.99 mg/dL (ref 0.44–1.00)
GFR, Estimated: 59 mL/min — ABNORMAL LOW (ref >60)
Glucose, Bld: 113 mg/dL — ABNORMAL HIGH (ref 70–99)
Potassium: 4.1 mmol/L (ref 3.5–5.1)
Sodium: 133 mmol/L — ABNORMAL LOW (ref 135–145)

## 2023-07-16 MED ORDER — SODIUM CHLORIDE 0.9 % IV SOLN
75.0000 mg/m2 | Freq: Once | INTRAVENOUS | Status: AC
  Administered 2023-07-16: 114 mg via INTRAVENOUS
  Filled 2023-07-16: qty 11.4

## 2023-07-16 MED ORDER — SODIUM CHLORIDE 0.9% FLUSH
10.0000 mL | INTRAVENOUS | Status: DC | PRN
  Administered 2023-07-16: 10 mL

## 2023-07-16 MED ORDER — ONDANSETRON HCL 4 MG/2ML IJ SOLN
8.0000 mg | Freq: Once | INTRAMUSCULAR | Status: AC
  Administered 2023-07-16: 8 mg via INTRAVENOUS
  Filled 2023-07-16: qty 4

## 2023-07-16 MED ORDER — SODIUM CHLORIDE 0.9 % IV SOLN: Freq: Once | INTRAVENOUS | Status: AC

## 2023-07-16 MED ORDER — HEPARIN SOD (PORK) LOCK FLUSH 100 UNIT/ML IV SOLN
500.0000 [IU] | Freq: Once | INTRAVENOUS | Status: AC | PRN
  Administered 2023-07-16: 500 [IU]

## 2023-07-16 MED ORDER — SODIUM CHLORIDE 0.9% FLUSH
10.0000 mL | Freq: Once | INTRAVENOUS | Status: AC
  Administered 2023-07-16: 10 mL

## 2023-07-16 NOTE — Assessment & Plan Note (Addendum)
diagnosed in 10/2021 ---She developed worsening back pain, pelvic MRI 07/04/21 showed a mass in the presacral space. 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  -She began azacitadine on 02/06/22. she has had multiple hospital admissions and her performance status has been very low.  Changed her chemo to every 6 weeks in 08/2022 due to her fatigue. -she is tolerating every 6 weeks much better, feels better and able to do all her ADL now -she saw Dr. Sharyne Richters in Feb 2024, who recommends to continue current therapy  -Due to worsening low back pain, she completed palliative radiation on 07/02/2023 -Chemo was held last week due to her nausea, vomiting, and the ED visit.  She has recovered well. -Lab reviewed, adequate for treatment, will proceed cycle 8 Vidaza

## 2023-07-16 NOTE — Patient Instructions (Signed)
[facility]  Discharge Instructions: Thank you for choosing [facility] to provide your oncology and hematology care.   If you have a lab appointment with the Cancer Center, please go directly to the Cancer Center and check in at the registration area.   Wear comfortable clothing and clothing appropriate for easy access to any Portacath or PICC line.   We strive to give you quality time with your provider. You may need to reschedule your appointment if you arrive late (15 or more minutes).  Arriving late affects you and other patients whose appointments are after yours.  Also, if you miss three or more appointments without notifying the office, you may be dismissed from the clinic at the provider's discretion.      For prescription refill requests, have your pharmacy contact our office and allow 72 hours for refills to be completed.    Today you received the following chemotherapy and/or immunotherapy agents vidaza       To help prevent nausea and vomiting after your treatment, we encourage you to take your nausea medication as directed.  BELOW ARE SYMPTOMS THAT SHOULD BE REPORTED IMMEDIATELY: *FEVER GREATER THAN 100.4 F (38 C) OR HIGHER *CHILLS OR SWEATING *NAUSEA AND VOMITING THAT IS NOT CONTROLLED WITH YOUR NAUSEA MEDICATION *UNUSUAL SHORTNESS OF BREATH *UNUSUAL BRUISING OR BLEEDING *URINARY PROBLEMS (pain or burning when urinating, or frequent urination) *BOWEL PROBLEMS (unusual diarrhea, constipation, pain near the anus) TENDERNESS IN MOUTH AND THROAT WITH OR WITHOUT PRESENCE OF ULCERS (sore throat, sores in mouth, or a toothache) UNUSUAL RASH, SWELLING OR PAIN  UNUSUAL VAGINAL DISCHARGE OR ITCHING   Items with * indicate a potential emergency and should be followed up as soon as possible or go to the Emergency Department if any problems should occur.  Please show the CHEMOTHERAPY ALERT CARD or IMMUNOTHERAPY ALERT CARD at  check-in to the Emergency Department and triage nurse.  Should you have questions after your visit or need to cancel or reschedule your appointment, please contact [facility]  Dept: [phone]  and follow the prompts.  Office hours are 8:00 a.m. to 4:30 p.m. Monday - Friday. Please note that voicemails left after 4:00 p.m. may not be returned until the following business day.  We are closed weekends and major holidays. You have access to a nurse at all times for urgent questions. Please call the main number to the clinic Dept: [phone] and follow the prompts.   For any non-urgent questions, you may also contact your provider using MyChart. We now offer e-Visits for anyone 18 and older to request care online for non-urgent symptoms. For details visit [email or web address].   Also download the MyChart app! Go to the app store, search "MyChart", open the app, select [place], and log in with your MyChart username and password.   

## 2023-07-16 NOTE — Progress Notes (Signed)
[facility]   [phone] [phone]   Clinic Follow up Note   Patient Care Team: [name], MD as PCP - General (Internal Medicine) [name], Deloris Ping, MD as PCP - Cardiology (Cardiology) Zenovia Jordan, MD as Consulting Physician (Rheumatology) Malachy Mood, MD as Consulting Physician (Hematology) Charna Elizabeth, MD as Consulting Physician (Gastroenterology) Josephine Igo, DO as Consulting Physician (Pulmonary Disease)  Date of Service:  07/16/2023  CHIEF COMPLAINT: f/u of CMML    CURRENT THERAPY:  MYELODYSPLASIA Azacitidine IV D1-7 q 6 weeks   ASSESSMENT:  [name] is a 77 y.o. female with   CMML (chronic myelomonocytic leukemia) (HCC) diagnosed in 10/2021 ---She developed worsening back pain, pelvic MRI 07/04/21 showed a mass in the presacral space. 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 Her BM biopsy was reviewed at [facility] and was felt to be CMML-1 with 5% blasts.  -She began azacitadine on 02/06/22. she has had multiple hospital admissions and her performance status has been very low.  Changed her chemo to every 6 weeks in 08/2022 due to her fatigue. -she is tolerating every 6 weeks much better, feels better and able to do all her ADL now -she saw Dr. Sharyne Richters in Feb 2024, who recommends to continue current therapy  -Due to worsening low back pain, she completed palliative radiation on 07/02/2023 -Chemo was held last week due to her nausea, vomiting, and the ED visit.  She has recovered well. -Lab reviewed, adequate for treatment, will proceed cycle 8 Vidaza  Leg edema  -chronic and fluctuates  -I recommend her to elevated her legs and ware compression stocks   PLAN: -lab reviewed hg 9.4 -I recommend pt wearing compression socks and keep feet  elevated. -proceed with Vidaza today and daily for next 4 days  -lab and f/u in 3 weeks, next cycle chemo in 6 weeks   SUMMARY OF ONCOLOGIC HISTORY: Oncology History Overview Note   Cancer Staging  CMML (chronic myelomonocytic leukemia) (HCC) Staging form: Chronic Myeloid Leukemia, AJCC 8th Edition - Clinical stage from 01/12/2022: Bone marrow blast count (%): 12, Additional clonal changes: Unknown - Signed by Malachy Mood, MD on 01/12/2022 Stage prefix: Initial diagnosis     CMML (chronic myelomonocytic leukemia) (HCC)  11/09/2021 Initial Biopsy   DIAGNOSIS:   -  Monoclonal B-cell population with co-expression of CD5 comprises 17%  of all lymphocytes  -  See comment   COMMENT:  In addition to the clonal B-cell population, there is a myeloblast  population (CD34, CD38, HLA-DR, CD117, CD123 and CD33) that comprises 2% of the total cellular events.  Please see concurrent tissue biopsy (below) for additional work-up and final diagnosis.    FINAL MICROSCOPIC DIAGNOSIS:   A. SOFT TISSUE MASS, PRE SACRAL, NEEDLE CORE BIOPSY:  -  Chronic lymphocytic leukemia/small lymphocytic lymphoma  -  Extra medullary hematopoiesis  -  See comment   COMMENT:  The biopsy consists of multiple soft tissue cores with lymphoid nodules and a dense hematopoietic infiltrate consistent with extra medullary hematopoiesis.  MPO and E-cadherin highlight myeloid and erythroid precursors respectively.  CD34 highlights increased vasculature and is also positive within the cytoplasm of megakaryocytes.  A few small, immature mononuclear cells appear to be positive for CD34 and CD117. TdT shows rare, scattered positive cells.  CD20 highlights aggregates of B cells which are admixed with CD3 positive  T cells.  T cells are an admixture of CD4 and CD8.  The B cells are also positive for CD5, CD23 and Bcl-2.  The B cells do not show significant staining for CD10, BCL6 or cyclin D1.  CD138 highlights scattered plasma cells which  are polytypic by kappa and lambda in situ hybridization.  Flow cytometry performed on the sample (see WL [id number]) identified a kappa restricted CD5 positive B-cell population comprising 70% of lymphocytes.  In addition, a small myeloblast population comprised 2% of the total cellular events.   Overall, the findings are consistent with soft tissue involvement by  chronic lymphocytic leukemia/small lymphocytic lymphoma and extra medullary hematopoiesis. In reviewing the patient's CBC data (macrocytic anemia and thrombocytopenia), I would recommend a bone marrow biopsy to assess for marrow involvement by CLL/SLL.    12/23/2021 Imaging   EXAM: CT CHEST, ABDOMEN, AND PELVIS WITH CONTRAST  IMPRESSION: 1. Slight interval enlargement of a presacral soft tissue mass measuring 7.2 x 4.7 cm, previously 6.9 x 4.1 cm on prior MR of the pelvis dated 07/04/2021. By report, this represents a biopsy proven lymphoma. 2. Pleural nodule of the dependent right lower lobe overlying the posterior right tenth rib and pleural or paraspinous soft tissue mass overlying the right aspect of the T10 vertebral body, very slightly enlarged compared to prior examination of the chest dated 06/25/2020, consistent with additional sites of lymphomatous involvement given very indolent growth. These could be better assessed for metabolic activity by FDG PET/CT if desired. 3. There is mild, bibasilar predominant pulmonary fibrosis in a pattern featuring irregular peripheral interstitial opacity, septal thickening, but without clear evidence of subpleural bronchiolectasis or honeycombing, with a somewhat asymmetric distribution most conspicuously involving the right lower lobe and lingula. These findings are significantly worsened when compared to prior examination dated 06/25/2020, particularly in the right lower lobe. Given interval change, this may reflect sequelae of interval infection or aspiration, however appearance is  generally suspicious for fibrotic interstitial lung disease, and if characterized by ATS pulmonary fibrosis is in an "indeterminate for UIP" pattern, differential considerations including both UIP and NSIP. 4. Emphysema.   Aortic Atherosclerosis (ICD10-I70.0) and Emphysema (ICD10-J43.9).   01/05/2022 Pathology Results   DIAGNOSIS:   BONE MARROW, ASPIRATE, CLOT, CORE:  -Hypercellular bone marrow for age with features of  myelodysplastic/myeloproliferative neoplasm  -Minor abnormal B-cell population  -See comment   PERIPHERAL BLOOD:  -Macrocytic anemia  -Neutrophilic left shift and monocytosis  -Thrombocytopenia   COMMENT:  The bone marrow is hypercellular for age with dyspoietic changes  involving myeloid cell lines associated with monocytosis and increased number of blastic cells (12%) as primarily seen by morphology, many of which display monocytic features.  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  In this background, there are several predominantly small lymphoid aggregates mostly composed of small lymphoid cells.  By flow cytometry, a minor abnormal B-cell population expressing CD5 is seen and representing 2% of all cells.  This correlate with previously known B-cell lymphoproliferative process.  Correlation with cytogenetic and FISH studies is strongly recommended.    DIAGNOSIS:   -Increased number of monocytic cells present (25%)  -Minor abnormal B-cell population identified.  -See comment   COMMENT:  Flow cytometric analysis shows increased number of monocytic cells representing 25% of all cells but without aberrant phenotype or CD34 expression.  A significant CD34-positive blastic population is not identified.  The lymphoid  population shows a minor B-cell population representing 2% of all cells and  expressing B-cell antigens including CD20 associated with CD5, CD200 and possibly dim kappa expression.  The latter findings are abnormal and correlate with previously known B-cell lymphoproliferative process.  No significant T-cell phenotypic abnormalities identified.    01/12/2022 Initial Diagnosis   CMML (chronic myelomonocytic leukemia) (HCC)   01/12/2022 Cancer Staging   Staging form: Chronic Myeloid Leukemia, AJCC 8th Edition - Clinical stage from 01/12/2022: Bone marrow blast count (%): 12, Additional clonal changes: Unknown - Signed by Malachy Mood, MD on 01/12/2022 Stage prefix: Initial diagnosis   02/06/2022 - 08/11/2022 Chemotherapy   Patient is on Treatment Plan : MYELODYSPLASIA  Azacitidine IV D1-7 q28d     02/06/2022 -  Chemotherapy   Patient is on Treatment Plan : MYELODYSPLASIA  Azacitidine IV D1-7 q28d        INTERVAL HISTORY:  [name] is here for a follow up of CMML . She was last seen by me on 06/04/2023. She presents to the clinic accompanied by husband. Pt state that radiation was rough. Pt reports of having decrease pain in her back since having radiation. Pt report of swelling in her ankles that started two days ago. Pt state that she eats , but her husbands said its less.    All other systems were reviewed with the patient and are negative.  MEDICAL HISTORY:  Past Medical History:  Diagnosis Date   Arthritis    Rheumatoid arthritis   Celiac disease    Chronic kidney disease    stage 3 from MD notes   COPD (chronic obstructive pulmonary disease) (HCC)    Dyspnea    with going up stairs   Family history of adverse reaction to anesthesia    father had hard time waking up   Headache    sinus headaches   Hot flashes    Hypertension    Iron deficiency anemia    Pneumonia    per patient "I have walking pneumonia"    SURGICAL HISTORY: Past Surgical History:  Procedure Laterality Date   COLONOSCOPY     ECTOPIC PREGNANCY SURGERY      x 2   IR IMAGING  GUIDED PORT INSERTION  01/31/2022   REVERSE SHOULDER ARTHROPLASTY Right 02/02/2017   Procedure: RIGHT REVERSE SHOULDER ARTHROPLASTY;  Surgeon: Beverely Low, MD;  Location: [facility] OR;  Service: Orthopedics;  Laterality: Right;    I have reviewed the social history and family history with the patient and they are unchanged from previous note.  ALLERGIES:  is allergic to digoxin and related, gluten meal, penicillins, fexofenadine, alendronate, and hydroxychloroquine.  MEDICATIONS:  Current Outpatient Medications  Medication Sig Dispense Refill   acetaminophen-codeine (TYLENOL #3) 300-30 MG tablet Take 1 tablet by mouth every 6 (six) hours as needed for moderate pain. 20 tablet 0   apixaban (ELIQUIS) 5 MG TABS tablet Take 1 tablet (5 mg total) by mouth 2 (two) times daily. 60 tablet 11   denosumab (PROLIA) 60 MG/ML SOSY injection Inject 60 mg into the skin every 6 (six) months.     diltiazem (CARDIZEM CD) 360 MG 24 hr capsule Take 1 capsule (360 mg total) by mouth daily. 90 capsule 3   fluconazole (DIFLUCAN) 10 MG/ML suspension Take 10 mLs (100 mg total) by mouth daily for 7 days. 35 mL 0   furosemide (LASIX) 20 MG tablet Take 1 tablet (20 mg total) by mouth daily. 30 tablet 11   gabapentin (NEURONTIN) 300  MG capsule Take 300 mg by mouth in the morning.     Metoprolol Tartrate 75 MG TABS Take 150 mg by mouth 2 (two) times daily. 360 tablet 3   pantoprazole (PROTONIX) 20 MG tablet Take 1 tablet (20 mg total) by mouth daily. For three weeks 21 tablet 0   potassium chloride SA (KLOR-CON M20) 20 MEQ tablet Take 1 tablet (20 mEq total) by mouth daily. Take with furosemide 90 tablet 3   predniSONE (DELTASONE) 5 MG tablet Take 5 mg by mouth daily with breakfast.     Tiotropium Bromide-Olodaterol (STIOLTO RESPIMAT) 2.5-2.5 MCG/ACT AERS Inhale 2 puffs into the lungs daily. 4 g 11   No current facility-administered medications for this visit.   Facility-Administered Medications Ordered in Other Visits   Medication Dose Route Frequency Provider Last Rate Last Admin   acetaminophen (TYLENOL) 325 MG tablet            diphenhydrAMINE (BENADRYL) 25 mg capsule             PHYSICAL EXAMINATION: ECOG PERFORMANCE STATUS: 2 - Symptomatic, <50% confined to bed  Vitals:   07/16/23 1436  BP: 109/66  Pulse: 91  Resp: 18  Temp: 97.8 F (36.6 C)  SpO2: 100%   Wt Readings from Last 3 Encounters:  07/16/23 106 lb 14.4 oz (48.5 kg)  07/09/23 105 lb (47.6 kg)  06/08/23 106 lb 9.6 oz (48.4 kg)     GENERAL:alert, no distress and comfortable SKIN: skin color normal, no rashes or significant lesions EYES: normal, Conjunctiva are pink and non-injected, sclera clear  NEURO: alert & oriented x 3 with fluent speech LABORATORY DATA:  I have reviewed the data as listed    Latest Ref Rng & Units 07/16/2023    1:46 PM 07/09/2023    9:00 AM 06/18/2023   12:30 PM  CBC  WBC 4.0 - 10.5 K/uL 10.4  11.6  8.5   Hemoglobin 12.0 - 15.0 g/dL 9.4  27.2  53.6   Hematocrit 36.0 - 46.0 % 28.6  31.5  30.7   Platelets 150 - 400 K/uL 138  134  105         Latest Ref Rng & Units 07/16/2023    1:46 PM 07/09/2023    9:00 AM 06/18/2023   12:30 PM  CMP  Glucose 70 - 99 mg/dL 644  034  742   BUN 8 - 23 mg/dL 23  26  24    Creatinine 0.44 - 1.00 mg/dL 5.95  6.38  7.56   Sodium 135 - 145 mmol/L 133  132  137   Potassium 3.5 - 5.1 mmol/L 4.1  3.0  4.4   Chloride 98 - 111 mmol/L 98  95  102   CO2 22 - 32 mmol/L 27  27  27    Calcium 8.9 - 10.3 mg/dL 9.2  9.2  43.3   Total Protein 6.5 - 8.1 g/dL  5.7  6.1   Total Bilirubin 0.3 - 1.2 mg/dL  1.0  0.7   Alkaline Phos 38 - 126 U/L  49  57   AST 15 - 41 U/L  16  22   ALT 0 - 44 U/L  12  19       RADIOGRAPHIC STUDIES: I have personally reviewed the radiological images as listed and agreed with the findings in the report. No results found.    No orders of the defined types were placed in this encounter.  All questions were answered. The patient knows to  call the  clinic with any problems, questions or concerns. No barriers to learning was detected. The total time spent in the appointment was 30 minutes.     Malachy Mood, MD 07/16/2023   Carolin Coy, CMA, am acting as scribe for Malachy Mood, MD.   I have reviewed the above documentation for accuracy and completeness, and I agree with the above.

## 2023-07-17 ENCOUNTER — Inpatient Hospital Stay: Payer: Medicare Other

## 2023-07-17 VITALS — BP 109/61 | HR 92 | Temp 97.9°F | Resp 16

## 2023-07-17 DIAGNOSIS — C9310 Chronic myelomonocytic leukemia not having achieved remission: Secondary | ICD-10-CM

## 2023-07-17 DIAGNOSIS — M0579 Rheumatoid arthritis with rheumatoid factor of multiple sites without organ or systems involvement: Secondary | ICD-10-CM | POA: Diagnosis not present

## 2023-07-17 DIAGNOSIS — Z5111 Encounter for antineoplastic chemotherapy: Secondary | ICD-10-CM | POA: Diagnosis not present

## 2023-07-17 MED ORDER — ONDANSETRON HCL 4 MG/2ML IJ SOLN
8.0000 mg | Freq: Once | INTRAMUSCULAR | Status: AC
  Administered 2023-07-17: 8 mg via INTRAVENOUS
  Filled 2023-07-17: qty 4

## 2023-07-17 MED ORDER — SODIUM CHLORIDE 0.9% FLUSH
10.0000 mL | INTRAVENOUS | Status: DC | PRN
  Administered 2023-07-17: 10 mL

## 2023-07-17 MED ORDER — SODIUM CHLORIDE 0.9 % IV SOLN: Freq: Once | INTRAVENOUS | Status: AC

## 2023-07-17 MED ORDER — SODIUM CHLORIDE 0.9 % IV SOLN
75.0000 mg/m2 | Freq: Once | INTRAVENOUS | Status: AC
  Administered 2023-07-17: 114 mg via INTRAVENOUS
  Filled 2023-07-17: qty 11.4

## 2023-07-17 MED ORDER — HEPARIN SOD (PORK) LOCK FLUSH 100 UNIT/ML IV SOLN
500.0000 [IU] | Freq: Once | INTRAVENOUS | Status: AC | PRN
  Administered 2023-07-17: 500 [IU]

## 2023-07-17 NOTE — Patient Instructions (Signed)
[facility]  Discharge Instructions: Thank you for choosing [facility] to provide your oncology and hematology care.   If you have a lab appointment with the Cancer Center, please go directly to the Cancer Center and check in at the registration area.   Wear comfortable clothing and clothing appropriate for easy access to any Portacath or PICC line.   We strive to give you quality time with your provider. You may need to reschedule your appointment if you arrive late (15 or more minutes).  Arriving late affects you and other patients whose appointments are after yours.  Also, if you miss three or more appointments without notifying the office, you may be dismissed from the clinic at the provider's discretion.      For prescription refill requests, have your pharmacy contact our office and allow 72 hours for refills to be completed.    Today you received the following chemotherapy and/or immunotherapy agents: Vidaza     To help prevent nausea and vomiting after your treatment, we encourage you to take your nausea medication as directed.  BELOW ARE SYMPTOMS THAT SHOULD BE REPORTED IMMEDIATELY: *FEVER GREATER THAN 100.4 F (38 C) OR HIGHER *CHILLS OR SWEATING *NAUSEA AND VOMITING THAT IS NOT CONTROLLED WITH YOUR NAUSEA MEDICATION *UNUSUAL SHORTNESS OF BREATH *UNUSUAL BRUISING OR BLEEDING *URINARY PROBLEMS (pain or burning when urinating, or frequent urination) *BOWEL PROBLEMS (unusual diarrhea, constipation, pain near the anus) TENDERNESS IN MOUTH AND THROAT WITH OR WITHOUT PRESENCE OF ULCERS (sore throat, sores in mouth, or a toothache) UNUSUAL RASH, SWELLING OR PAIN  UNUSUAL VAGINAL DISCHARGE OR ITCHING   Items with * indicate a potential emergency and should be followed up as soon as possible or go to the Emergency Department if any problems should occur.  Please show the CHEMOTHERAPY ALERT CARD or IMMUNOTHERAPY ALERT CARD at check-in  to the Emergency Department and triage nurse.  Should you have questions after your visit or need to cancel or reschedule your appointment, please contact [facility]  Dept: [phone]  and follow the prompts.  Office hours are 8:00 a.m. to 4:30 p.m. Monday - Friday. Please note that voicemails left after 4:00 p.m. may not be returned until the following business day.  We are closed weekends and major holidays. You have access to a nurse at all times for urgent questions. Please call the main number to the clinic Dept: [phone] and follow the prompts.   For any non-urgent questions, you may also contact your provider using MyChart. We now offer e-Visits for anyone 18 and older to request care online for non-urgent symptoms. For details visit [email or web address].   Also download the MyChart app! Go to the app store, search "MyChart", open the app, select [place], and log in with your MyChart username and password.  

## 2023-07-18 ENCOUNTER — Inpatient Hospital Stay: Payer: Medicare Other

## 2023-07-18 VITALS — BP 98/54 | HR 59 | Temp 97.4°F | Resp 18 | Wt 106.8 lb

## 2023-07-18 DIAGNOSIS — C9310 Chronic myelomonocytic leukemia not having achieved remission: Secondary | ICD-10-CM | POA: Diagnosis not present

## 2023-07-18 DIAGNOSIS — Z5111 Encounter for antineoplastic chemotherapy: Secondary | ICD-10-CM | POA: Diagnosis not present

## 2023-07-18 MED ORDER — SODIUM CHLORIDE 0.9% FLUSH
10.0000 mL | INTRAVENOUS | Status: DC | PRN
  Administered 2023-07-18: 10 mL

## 2023-07-18 MED ORDER — SODIUM CHLORIDE 0.9 % IV SOLN: Freq: Once | INTRAVENOUS | Status: AC

## 2023-07-18 MED ORDER — HEPARIN SOD (PORK) LOCK FLUSH 100 UNIT/ML IV SOLN
500.0000 [IU] | Freq: Once | INTRAVENOUS | Status: AC | PRN
  Administered 2023-07-18: 500 [IU]

## 2023-07-18 MED ORDER — ONDANSETRON HCL 4 MG/2ML IJ SOLN
8.0000 mg | Freq: Once | INTRAMUSCULAR | Status: AC
  Administered 2023-07-18: 8 mg via INTRAVENOUS
  Filled 2023-07-18: qty 4

## 2023-07-18 MED ORDER — SODIUM CHLORIDE 0.9 % IV SOLN
75.0000 mg/m2 | Freq: Once | INTRAVENOUS | Status: AC
  Administered 2023-07-18: 114 mg via INTRAVENOUS
  Filled 2023-07-18: qty 11.4

## 2023-07-19 ENCOUNTER — Other Ambulatory Visit: Payer: Self-pay

## 2023-07-19 ENCOUNTER — Inpatient Hospital Stay: Payer: Medicare Other

## 2023-07-19 VITALS — BP 112/52 | HR 72 | Temp 97.8°F | Resp 17

## 2023-07-19 DIAGNOSIS — Z5111 Encounter for antineoplastic chemotherapy: Secondary | ICD-10-CM | POA: Diagnosis not present

## 2023-07-19 DIAGNOSIS — C9310 Chronic myelomonocytic leukemia not having achieved remission: Secondary | ICD-10-CM

## 2023-07-19 MED ORDER — SODIUM CHLORIDE 0.9 % IV SOLN
75.0000 mg/m2 | Freq: Once | INTRAVENOUS | Status: AC
  Administered 2023-07-19: 114 mg via INTRAVENOUS
  Filled 2023-07-19: qty 11.4

## 2023-07-19 MED ORDER — HEPARIN SOD (PORK) LOCK FLUSH 100 UNIT/ML IV SOLN
500.0000 [IU] | Freq: Once | INTRAVENOUS | Status: AC | PRN
  Administered 2023-07-19: 500 [IU]

## 2023-07-19 MED ORDER — ONDANSETRON HCL 4 MG/2ML IJ SOLN
8.0000 mg | Freq: Once | INTRAMUSCULAR | Status: AC
  Administered 2023-07-19: 8 mg via INTRAVENOUS
  Filled 2023-07-19: qty 4

## 2023-07-19 MED ORDER — SODIUM CHLORIDE 0.9 % IV SOLN: Freq: Once | INTRAVENOUS | Status: AC

## 2023-07-19 MED ORDER — SODIUM CHLORIDE 0.9% FLUSH
10.0000 mL | INTRAVENOUS | Status: DC | PRN
  Administered 2023-07-19: 10 mL

## 2023-07-19 NOTE — Patient Instructions (Signed)
[facility]  Discharge Instructions: Thank you for choosing [facility] to provide your oncology and hematology care.   If you have a lab appointment with the Cancer Center, please go directly to the Cancer Center and check in at the registration area.   Wear comfortable clothing and clothing appropriate for easy access to any Portacath or PICC line.   We strive to give you quality time with your provider. You may need to reschedule your appointment if you arrive late (15 or more minutes).  Arriving late affects you and other patients whose appointments are after yours.  Also, if you miss three or more appointments without notifying the office, you may be dismissed from the clinic at the provider's discretion.      For prescription refill requests, have your pharmacy contact our office and allow 72 hours for refills to be completed.    Today you received the following chemotherapy and/or immunotherapy agents vidaza       To help prevent nausea and vomiting after your treatment, we encourage you to take your nausea medication as directed.  BELOW ARE SYMPTOMS THAT SHOULD BE REPORTED IMMEDIATELY: *FEVER GREATER THAN 100.4 F (38 C) OR HIGHER *CHILLS OR SWEATING *NAUSEA AND VOMITING THAT IS NOT CONTROLLED WITH YOUR NAUSEA MEDICATION *UNUSUAL SHORTNESS OF BREATH *UNUSUAL BRUISING OR BLEEDING *URINARY PROBLEMS (pain or burning when urinating, or frequent urination) *BOWEL PROBLEMS (unusual diarrhea, constipation, pain near the anus) TENDERNESS IN MOUTH AND THROAT WITH OR WITHOUT PRESENCE OF ULCERS (sore throat, sores in mouth, or a toothache) UNUSUAL RASH, SWELLING OR PAIN  UNUSUAL VAGINAL DISCHARGE OR ITCHING   Items with * indicate a potential emergency and should be followed up as soon as possible or go to the Emergency Department if any problems should occur.  Please show the CHEMOTHERAPY ALERT CARD or IMMUNOTHERAPY ALERT CARD at  check-in to the Emergency Department and triage nurse.  Should you have questions after your visit or need to cancel or reschedule your appointment, please contact [facility]  Dept: [phone]  and follow the prompts.  Office hours are 8:00 a.m. to 4:30 p.m. Monday - Friday. Please note that voicemails left after 4:00 p.m. may not be returned until the following business day.  We are closed weekends and major holidays. You have access to a nurse at all times for urgent questions. Please call the main number to the clinic Dept: [phone] and follow the prompts.   For any non-urgent questions, you may also contact your provider using MyChart. We now offer e-Visits for anyone 18 and older to request care online for non-urgent symptoms. For details visit [email or web address].   Also download the MyChart app! Go to the app store, search "MyChart", open the app, select [place], and log in with your MyChart username and password.   

## 2023-07-20 ENCOUNTER — Inpatient Hospital Stay: Payer: Medicare Other

## 2023-07-20 VITALS — BP 127/83 | HR 75 | Temp 97.7°F | Resp 16

## 2023-07-20 DIAGNOSIS — C9310 Chronic myelomonocytic leukemia not having achieved remission: Secondary | ICD-10-CM | POA: Diagnosis not present

## 2023-07-20 DIAGNOSIS — Z5111 Encounter for antineoplastic chemotherapy: Secondary | ICD-10-CM | POA: Diagnosis not present

## 2023-07-20 MED ORDER — SODIUM CHLORIDE 0.9 % IV SOLN
75.0000 mg/m2 | Freq: Once | INTRAVENOUS | Status: AC
  Administered 2023-07-20: 114 mg via INTRAVENOUS
  Filled 2023-07-20: qty 11.4

## 2023-07-20 MED ORDER — ONDANSETRON HCL 4 MG/2ML IJ SOLN
8.0000 mg | Freq: Once | INTRAMUSCULAR | Status: AC
  Administered 2023-07-20: 8 mg via INTRAVENOUS
  Filled 2023-07-20: qty 4

## 2023-07-20 MED ORDER — SODIUM CHLORIDE 0.9 % IV SOLN: Freq: Once | INTRAVENOUS | Status: AC

## 2023-07-20 MED ORDER — HEPARIN SOD (PORK) LOCK FLUSH 100 UNIT/ML IV SOLN
500.0000 [IU] | Freq: Once | INTRAVENOUS | Status: AC | PRN
  Administered 2023-07-20: 500 [IU]

## 2023-07-20 MED ORDER — SODIUM CHLORIDE 0.9% FLUSH
10.0000 mL | INTRAVENOUS | Status: DC | PRN
  Administered 2023-07-20: 10 mL

## 2023-07-20 NOTE — Patient Instructions (Signed)
[facility]  Discharge Instructions: Thank you for choosing [facility] to provide your oncology and hematology care.   If you have a lab appointment with the Cancer Center, please go directly to the Cancer Center and check in at the registration area.   Wear comfortable clothing and clothing appropriate for easy access to any Portacath or PICC line.   We strive to give you quality time with your provider. You may need to reschedule your appointment if you arrive late (15 or more minutes).  Arriving late affects you and other patients whose appointments are after yours.  Also, if you miss three or more appointments without notifying the office, you may be dismissed from the clinic at the provider's discretion.      For prescription refill requests, have your pharmacy contact our office and allow 72 hours for refills to be completed.    Today you received the following chemotherapy and/or immunotherapy agents: azacitidine      To help prevent nausea and vomiting after your treatment, we encourage you to take your nausea medication as directed.  BELOW ARE SYMPTOMS THAT SHOULD BE REPORTED IMMEDIATELY: *FEVER GREATER THAN 100.4 F (38 C) OR HIGHER *CHILLS OR SWEATING *NAUSEA AND VOMITING THAT IS NOT CONTROLLED WITH YOUR NAUSEA MEDICATION *UNUSUAL SHORTNESS OF BREATH *UNUSUAL BRUISING OR BLEEDING *URINARY PROBLEMS (pain or burning when urinating, or frequent urination) *BOWEL PROBLEMS (unusual diarrhea, constipation, pain near the anus) TENDERNESS IN MOUTH AND THROAT WITH OR WITHOUT PRESENCE OF ULCERS (sore throat, sores in mouth, or a toothache) UNUSUAL RASH, SWELLING OR PAIN  UNUSUAL VAGINAL DISCHARGE OR ITCHING   Items with * indicate a potential emergency and should be followed up as soon as possible or go to the Emergency Department if any problems should occur.  Please show the CHEMOTHERAPY ALERT CARD or IMMUNOTHERAPY ALERT CARD at  check-in to the Emergency Department and triage nurse.  Should you have questions after your visit or need to cancel or reschedule your appointment, please contact [facility]  Dept: [phone]  and follow the prompts.  Office hours are 8:00 a.m. to 4:30 p.m. Monday - Friday. Please note that voicemails left after 4:00 p.m. may not be returned until the following business day.  We are closed weekends and major holidays. You have access to a nurse at all times for urgent questions. Please call the main number to the clinic Dept: [phone] and follow the prompts.   For any non-urgent questions, you may also contact your provider using MyChart. We now offer e-Visits for anyone 18 and older to request care online for non-urgent symptoms. For details visit [email or web address].   Also download the MyChart app! Go to the app store, search "MyChart", open the app, select [place], and log in with your MyChart username and password.   

## 2023-07-22 ENCOUNTER — Other Ambulatory Visit: Payer: Self-pay | Admitting: Cardiovascular Disease

## 2023-07-25 ENCOUNTER — Other Ambulatory Visit: Payer: Self-pay

## 2023-07-25 MED ORDER — METOPROLOL TARTRATE 75 MG PO TABS: 150.0000 mg | ORAL_TABLET | Freq: Two times a day (BID) | ORAL | 2 refills | Status: DC

## 2023-07-25 NOTE — Telephone Encounter (Signed)
Pt's medication was sent to pt's pharmacy as requested. Confirmation received.  °

## 2023-07-27 DIAGNOSIS — L0232 Furuncle of buttock: Secondary | ICD-10-CM | POA: Diagnosis not present

## 2023-07-27 DIAGNOSIS — I4891 Unspecified atrial fibrillation: Secondary | ICD-10-CM | POA: Diagnosis not present

## 2023-07-27 DIAGNOSIS — R1909 Other intra-abdominal and pelvic swelling, mass and lump: Secondary | ICD-10-CM | POA: Diagnosis not present

## 2023-07-27 DIAGNOSIS — M810 Age-related osteoporosis without current pathological fracture: Secondary | ICD-10-CM | POA: Diagnosis not present

## 2023-08-03 DIAGNOSIS — L821 Other seborrheic keratosis: Secondary | ICD-10-CM | POA: Diagnosis not present

## 2023-08-03 DIAGNOSIS — I872 Venous insufficiency (chronic) (peripheral): Secondary | ICD-10-CM | POA: Diagnosis not present

## 2023-08-03 DIAGNOSIS — L245 Irritant contact dermatitis due to other chemical products: Secondary | ICD-10-CM | POA: Diagnosis not present

## 2023-08-03 DIAGNOSIS — L309 Dermatitis, unspecified: Secondary | ICD-10-CM | POA: Diagnosis not present

## 2023-08-03 DIAGNOSIS — D692 Other nonthrombocytopenic purpura: Secondary | ICD-10-CM | POA: Diagnosis not present

## 2023-08-03 DIAGNOSIS — L723 Sebaceous cyst: Secondary | ICD-10-CM | POA: Diagnosis not present

## 2023-08-03 DIAGNOSIS — I8312 Varicose veins of left lower extremity with inflammation: Secondary | ICD-10-CM | POA: Diagnosis not present

## 2023-08-03 DIAGNOSIS — I8311 Varicose veins of right lower extremity with inflammation: Secondary | ICD-10-CM | POA: Diagnosis not present

## 2023-08-06 NOTE — Assessment & Plan Note (Signed)
diagnosed in 10/2021 ---She developed worsening back pain, pelvic MRI 07/04/21 showed a mass in the presacral space. 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 Her BM biopsy was reviewed at [facility] and was felt to be CMML-1 with 5% blasts.  -She began azacitadine on 02/06/22. she has had multiple hospital admissions and her performance status has been very low.  Changed her chemo to every 6 weeks in 08/2022 due to her fatigue. -she is tolerating every 6 weeks much better, feels better and able to do all her ADL now -she saw Dr. Sharyne Richters in Feb 2024, who recommends to continue current therapy  -Due to worsening low back pain, she completed palliative radiation on 07/02/2023 -Lab reviewed, adequate for treatment, will continue azacitidine every 6 weeks

## 2023-08-07 ENCOUNTER — Inpatient Hospital Stay: Payer: Medicare Other | Attending: Nurse Practitioner

## 2023-08-07 ENCOUNTER — Inpatient Hospital Stay (HOSPITAL_BASED_OUTPATIENT_CLINIC_OR_DEPARTMENT_OTHER): Payer: Medicare Other | Admitting: Hematology

## 2023-08-07 ENCOUNTER — Encounter: Payer: Self-pay | Admitting: Hematology

## 2023-08-07 ENCOUNTER — Other Ambulatory Visit: Payer: Self-pay

## 2023-08-07 VITALS — BP 110/46 | HR 78 | Temp 97.7°F | Resp 18 | Ht 63.0 in | Wt 105.1 lb

## 2023-08-07 DIAGNOSIS — Z923 Personal history of irradiation: Secondary | ICD-10-CM | POA: Diagnosis not present

## 2023-08-07 DIAGNOSIS — Z95828 Presence of other vascular implants and grafts: Secondary | ICD-10-CM

## 2023-08-07 DIAGNOSIS — Z79899 Other long term (current) drug therapy: Secondary | ICD-10-CM | POA: Insufficient documentation

## 2023-08-07 DIAGNOSIS — C9310 Chronic myelomonocytic leukemia not having achieved remission: Secondary | ICD-10-CM | POA: Insufficient documentation

## 2023-08-07 LAB — CBC WITH DIFFERENTIAL (CANCER CENTER ONLY)
Abs Immature Granulocytes: 0.08 10*3/uL — ABNORMAL HIGH (ref 0.00–0.07)
Basophils Absolute: 0.1 10*3/uL (ref 0.0–0.1)
Basophils Relative: 1 %
Eosinophils Absolute: 0.1 10*3/uL (ref 0.0–0.5)
Eosinophils Relative: 1 %
HCT: 29.6 % — ABNORMAL LOW (ref 36.0–46.0)
Hemoglobin: 9.8 g/dL — ABNORMAL LOW (ref 12.0–15.0)
Immature Granulocytes: 1 %
Lymphocytes Relative: 18 %
Lymphs Abs: 1.6 10*3/uL (ref 0.7–4.0)
MCH: 35.8 pg — ABNORMAL HIGH (ref 26.0–34.0)
MCHC: 33.1 g/dL (ref 30.0–36.0)
MCV: 108 fL — ABNORMAL HIGH (ref 80.0–100.0)
Monocytes Absolute: 1.4 10*3/uL — ABNORMAL HIGH (ref 0.1–1.0)
Monocytes Relative: 16 %
Neutro Abs: 5.7 10*3/uL (ref 1.7–7.7)
Neutrophils Relative %: 63 %
Platelet Count: 86 10*3/uL — ABNORMAL LOW (ref 150–400)
RBC: 2.74 MIL/uL — ABNORMAL LOW (ref 3.87–5.11)
RDW: 16.5 % — ABNORMAL HIGH (ref 11.5–15.5)
WBC Count: 9 10*3/uL (ref 4.0–10.5)
nRBC: 0.6 % — ABNORMAL HIGH (ref 0.0–0.2)

## 2023-08-07 LAB — CMP (CANCER CENTER ONLY)
ALT: 13 U/L (ref 0–44)
AST: 17 U/L (ref 15–41)
Albumin: 3.8 g/dL (ref 3.5–5.0)
Alkaline Phosphatase: 65 U/L (ref 38–126)
Anion gap: 8 (ref 5–15)
BUN: 23 mg/dL (ref 8–23)
CO2: 30 mmol/L (ref 22–32)
Calcium: 9.8 mg/dL (ref 8.9–10.3)
Chloride: 102 mmol/L (ref 98–111)
Creatinine: 1 mg/dL (ref 0.44–1.00)
GFR, Estimated: 58 mL/min — ABNORMAL LOW (ref >60)
Glucose, Bld: 174 mg/dL — ABNORMAL HIGH (ref 70–99)
Potassium: 4.7 mmol/L (ref 3.5–5.1)
Sodium: 140 mmol/L (ref 135–145)
Total Bilirubin: 0.6 mg/dL (ref 0.3–1.2)
Total Protein: 6.1 g/dL — ABNORMAL LOW (ref 6.5–8.1)

## 2023-08-07 MED ORDER — SODIUM CHLORIDE 0.9% FLUSH
10.0000 mL | Freq: Once | INTRAVENOUS | Status: AC
  Administered 2023-08-07: 10 mL

## 2023-08-07 MED ORDER — HEPARIN SOD (PORK) LOCK FLUSH 100 UNIT/ML IV SOLN
500.0000 [IU] | Freq: Once | INTRAVENOUS | Status: AC
  Administered 2023-08-07: 500 [IU]

## 2023-08-07 NOTE — Progress Notes (Signed)
[facility]   [phone] [phone]   Clinic Follow up Note   Patient Care Team: [name], MD as PCP - General (Internal Medicine) [name], Deloris Ping, MD as PCP - Cardiology (Cardiology) Zenovia Jordan, MD as Consulting Physician (Rheumatology) Malachy Mood, MD as Consulting Physician (Hematology) Charna Elizabeth, MD as Consulting Physician (Gastroenterology) Josephine Igo, DO as Consulting Physician (Pulmonary Disease)  Date of Service:  08/07/2023  CHIEF COMPLAINT: f/u of CMML   CURRENT THERAPY:  MYELODYSPLASIA Azacitidine IV D1-5 q 6 weeks     ASSESSMENT:  [name] is a 77 y.o. female with   CMML (chronic myelomonocytic leukemia) (HCC) diagnosed in 10/2021 ---She developed worsening back pain, pelvic MRI 07/04/21 showed a mass in the presacral space. 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 Her BM biopsy was reviewed at [facility] and was felt to be CMML-1 with 5% blasts.  -She began azacitadine on 02/06/22. she has had multiple hospital admissions and her performance status has been very low.  Changed her chemo to every 6 weeks in 08/2022 due to her fatigue. -she is tolerating every 6 weeks much better, feels better and able to do all her ADL now -she saw Dr. Sharyne Richters in Feb 2024, who recommends to continue current therapy  -Due to worsening low back pain, she completed palliative radiation on 07/02/2023 -She developed a significant fatigue and some GI symptoms after radiation, but she has recovered well recently. -Lab reviewed, no blood transfusion needed. -She will return in 2 weeks for next cycle chemo    PLAN: Lab today -lab/flush and f/u and treatment 9/3   SUMMARY OF ONCOLOGIC HISTORY: Oncology History Overview Note   Cancer Staging  CMML (chronic  myelomonocytic leukemia) (HCC) Staging form: Chronic Myeloid Leukemia, AJCC 8th Edition - Clinical stage from 01/12/2022: Bone marrow blast count (%): 12, Additional clonal changes: Unknown - Signed by Malachy Mood, MD on 01/12/2022 Stage prefix: Initial diagnosis     CMML (chronic myelomonocytic leukemia) (HCC)  11/09/2021 Initial Biopsy   DIAGNOSIS:   -  Monoclonal B-cell population with co-expression of CD5 comprises 17%  of all lymphocytes  -  See comment   COMMENT:  In addition to the clonal B-cell population, there is a myeloblast  population (CD34, CD38, HLA-DR, CD117, CD123 and CD33) that comprises 2% of the total cellular events.  Please see concurrent tissue biopsy (below) for additional work-up and final diagnosis.    FINAL MICROSCOPIC DIAGNOSIS:   A. SOFT TISSUE MASS, PRE SACRAL, NEEDLE CORE BIOPSY:  -  Chronic lymphocytic leukemia/small lymphocytic lymphoma  -  Extra medullary hematopoiesis  -  See comment   COMMENT:  The biopsy consists of multiple soft tissue cores with lymphoid nodules and a dense hematopoietic infiltrate consistent with extra medullary hematopoiesis.  MPO and E-cadherin highlight myeloid and erythroid precursors respectively.  CD34 highlights increased vasculature and is also positive within the cytoplasm of megakaryocytes.  A few small, immature mononuclear cells appear to be positive for CD34 and CD117. TdT shows rare, scattered positive cells.  CD20 highlights aggregates of B cells which are admixed with CD3 positive T cells.  T cells are an admixture of CD4 and CD8.  The B cells are also positive for CD5, CD23 and Bcl-2.  The B cells do not show significant staining for CD10, BCL6 or cyclin D1.  CD138 highlights  scattered plasma cells which are polytypic by kappa and lambda in situ hybridization.  Flow cytometry performed on the sample (see WL [id number]) identified a kappa restricted CD5 positive B-cell population comprising 70% of lymphocytes.  In  addition, a small myeloblast population comprised 2% of the total cellular events.   Overall, the findings are consistent with soft tissue involvement by  chronic lymphocytic leukemia/small lymphocytic lymphoma and extra medullary hematopoiesis. In reviewing the patient's CBC data (macrocytic anemia and thrombocytopenia), I would recommend a bone marrow biopsy to assess for marrow involvement by CLL/SLL.    12/23/2021 Imaging   EXAM: CT CHEST, ABDOMEN, AND PELVIS WITH CONTRAST  IMPRESSION: 1. Slight interval enlargement of a presacral soft tissue mass measuring 7.2 x 4.7 cm, previously 6.9 x 4.1 cm on prior MR of the pelvis dated 07/04/2021. By report, this represents a biopsy proven lymphoma. 2. Pleural nodule of the dependent right lower lobe overlying the posterior right tenth rib and pleural or paraspinous soft tissue mass overlying the right aspect of the T10 vertebral body, very slightly enlarged compared to prior examination of the chest dated 06/25/2020, consistent with additional sites of lymphomatous involvement given very indolent growth. These could be better assessed for metabolic activity by FDG PET/CT if desired. 3. There is mild, bibasilar predominant pulmonary fibrosis in a pattern featuring irregular peripheral interstitial opacity, septal thickening, but without clear evidence of subpleural bronchiolectasis or honeycombing, with a somewhat asymmetric distribution most conspicuously involving the right lower lobe and lingula. These findings are significantly worsened when compared to prior examination dated 06/25/2020, particularly in the right lower lobe. Given interval change, this may reflect sequelae of interval infection or aspiration, however appearance is generally suspicious for fibrotic interstitial lung disease, and if characterized by ATS pulmonary fibrosis is in an "indeterminate for UIP" pattern, differential considerations including both UIP and NSIP. 4.  Emphysema.   Aortic Atherosclerosis (ICD10-I70.0) and Emphysema (ICD10-J43.9).   01/05/2022 Pathology Results   DIAGNOSIS:   BONE MARROW, ASPIRATE, CLOT, CORE:  -Hypercellular bone marrow for age with features of  myelodysplastic/myeloproliferative neoplasm  -Minor abnormal B-cell population  -See comment   PERIPHERAL BLOOD:  -Macrocytic anemia  -Neutrophilic left shift and monocytosis  -Thrombocytopenia   COMMENT:  The bone marrow is hypercellular for age with dyspoietic changes  involving myeloid cell lines associated with monocytosis and increased number of blastic cells (12%) as primarily seen by morphology, many of which display monocytic features.  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  In this background, there are several predominantly small lymphoid aggregates mostly composed of small lymphoid cells.  By flow cytometry, a minor abnormal B-cell population expressing CD5 is seen and representing 2% of all cells.  This correlate with previously known B-cell lymphoproliferative process.  Correlation with cytogenetic and FISH studies is strongly recommended.    DIAGNOSIS:   -Increased number of monocytic cells present (25%)  -Minor abnormal B-cell population identified.  -See comment   COMMENT:  Flow cytometric analysis shows increased number of monocytic cells representing 25% of all cells but without aberrant phenotype or CD34 expression.  A significant CD34-positive blastic population is not identified.  The lymphoid population shows a minor B-cell population representing 2% of all cells and expressing B-cell antigens including CD20 associated with CD5, CD200 and possibly dim kappa expression.  The latter findings are abnormal and correlate with previously known B-cell lymphoproliferative process.  No  significant T-cell phenotypic abnormalities identified.    01/12/2022 Initial Diagnosis   CMML (chronic myelomonocytic leukemia) (HCC)   01/12/2022 Cancer Staging   Staging form: Chronic Myeloid Leukemia, AJCC 8th Edition - Clinical stage from 01/12/2022: Bone marrow blast count (%): 12, Additional clonal changes: Unknown - Signed by Malachy Mood, MD on 01/12/2022 Stage prefix: Initial diagnosis   02/06/2022 - 08/11/2022 Chemotherapy   Patient is on Treatment Plan : MYELODYSPLASIA  Azacitidine IV D1-7 q28d     02/06/2022 -  Chemotherapy   Patient is on Treatment Plan : MYELODYSPLASIA  Azacitidine IV D1-7 q28d        INTERVAL HISTORY:  [name] is here for a follow up of CMML. She was last seen by me on 07/16/2023. She presents to the clinic accompanied by son. Pt state that her back pain has gotten better since completing radiation. Pt denies having nausea and vomiting . Her appetite is  good.     All other systems were reviewed with the patient and are negative.  MEDICAL HISTORY:  Past Medical History:  Diagnosis Date   Arthritis    Rheumatoid arthritis   Celiac disease    Chronic kidney disease    stage 3 from MD notes   COPD (chronic obstructive pulmonary disease) (HCC)    Dyspnea    with going up stairs   Family history of adverse reaction to anesthesia    father had hard time waking up   Headache    sinus headaches   Hot flashes    Hypertension    Iron deficiency anemia    Pneumonia    per patient "I have walking pneumonia"    SURGICAL HISTORY: Past Surgical History:  Procedure Laterality Date   COLONOSCOPY     ECTOPIC PREGNANCY SURGERY      x 2   IR IMAGING GUIDED PORT INSERTION  01/31/2022   REVERSE SHOULDER ARTHROPLASTY Right 02/02/2017   Procedure: RIGHT REVERSE SHOULDER ARTHROPLASTY;  Surgeon: Beverely Low, MD;  Location: [facility] OR;  Service: Orthopedics;  Laterality: Right;    I have reviewed the social history and family history with the patient and they  are unchanged from previous note.  ALLERGIES:  is allergic to digoxin and related, gluten meal, penicillins, fexofenadine, alendronate, and hydroxychloroquine.  MEDICATIONS:  Current Outpatient Medications  Medication Sig Dispense Refill   acetaminophen-codeine (TYLENOL #3) 300-30 MG tablet Take 1 tablet by mouth every 6 (six) hours as needed for moderate pain. 20 tablet 0   apixaban (ELIQUIS) 5 MG TABS tablet Take 1 tablet (5 mg total) by mouth 2 (two) times daily. 60 tablet 11   denosumab (PROLIA) 60 MG/ML SOSY injection Inject 60 mg into the skin every 6 (six) months.     diltiazem (CARDIZEM CD) 360 MG 24 hr capsule Take 1 capsule (360 mg total) by mouth daily. 90 capsule 3   furosemide (LASIX) 20 MG tablet Take 1 tablet (20 mg total) by mouth daily. 30 tablet 11   gabapentin (NEURONTIN) 300 MG capsule Take 300 mg by mouth in the morning.     Metoprolol Tartrate 75 MG TABS Take 2 tablets (150 mg total) by mouth 2 (two) times daily. 360 tablet 2   pantoprazole (PROTONIX) 20 MG tablet Take 1 tablet (20 mg total) by mouth daily. For three weeks 21 tablet 0   potassium chloride SA (KLOR-CON M20) 20 MEQ tablet Take 1 tablet (20 mEq total) by mouth daily. Take with furosemide 90 tablet 3  predniSONE (DELTASONE) 5 MG tablet Take 5 mg by mouth daily with breakfast.     Tiotropium Bromide-Olodaterol (STIOLTO RESPIMAT) 2.5-2.5 MCG/ACT AERS Inhale 2 puffs into the lungs daily. 4 g 11   No current facility-administered medications for this visit.   Facility-Administered Medications Ordered in Other Visits  Medication Dose Route Frequency Provider Last Rate Last Admin   acetaminophen (TYLENOL) 325 MG tablet            diphenhydrAMINE (BENADRYL) 25 mg capsule             PHYSICAL EXAMINATION: ECOG PERFORMANCE STATUS: 2 - Symptomatic, <50% confined to bed  Vitals:   08/07/23 1136  BP: (!) 110/46  Pulse: 78  Resp: 18  Temp: 97.7 F (36.5 C)  SpO2: 92%   Wt Readings from Last 3 Encounters:   08/07/23 105 lb 1.6 oz (47.7 kg)  07/18/23 106 lb 12 oz (48.4 kg)  07/16/23 106 lb 14.4 oz (48.5 kg)     GENERAL:alert, no distress and comfortable SKIN: skin color normal, no rashes or significant lesions EYES: normal, Conjunctiva are pink and non-injected, sclera clear  NEURO: alert & oriented x 3 with fluent speech  LABORATORY DATA:  I have reviewed the data as listed    Latest Ref Rng & Units 08/07/2023   11:37 AM 07/16/2023    1:46 PM 07/09/2023    9:00 AM  CBC  WBC 4.0 - 10.5 K/uL 9.0  10.4  11.6   Hemoglobin 12.0 - 15.0 g/dL 9.8  9.4  09.8   Hematocrit 36.0 - 46.0 % 29.6  28.6  31.5   Platelets 150 - 400 K/uL 86  138  134         Latest Ref Rng & Units 08/07/2023   11:37 AM 07/16/2023    1:46 PM 07/09/2023    9:00 AM  CMP  Glucose 70 - 99 mg/dL 119  147  829   BUN 8 - 23 mg/dL 23  23  26    Creatinine 0.44 - 1.00 mg/dL 5.62  1.30  8.65   Sodium 135 - 145 mmol/L 140  133  132   Potassium 3.5 - 5.1 mmol/L 4.7  4.1  3.0   Chloride 98 - 111 mmol/L 102  98  95   CO2 22 - 32 mmol/L 30  27  27    Calcium 8.9 - 10.3 mg/dL 9.8  9.2  9.2   Total Protein 6.5 - 8.1 g/dL 6.1   5.7   Total Bilirubin 0.3 - 1.2 mg/dL 0.6   1.0   Alkaline Phos 38 - 126 U/L 65   49   AST 15 - 41 U/L 17   16   ALT 0 - 44 U/L 13   12       RADIOGRAPHIC STUDIES: I have personally reviewed the radiological images as listed and agreed with the findings in the report. No results found.    No orders of the defined types were placed in this encounter.  All questions were answered. The patient knows to call the clinic with any problems, questions or concerns. No barriers to learning was detected. The total time spent in the appointment was 30 minutes.     Malachy Mood, MD 08/07/2023   Carolin Coy, CMA, am acting as scribe for Malachy Mood, MD.   I have reviewed the above documentation for accuracy and completeness, and I agree with the above.

## 2023-08-20 ENCOUNTER — Ambulatory Visit: Payer: Medicare Other | Admitting: Nurse Practitioner

## 2023-08-20 ENCOUNTER — Other Ambulatory Visit: Payer: Medicare Other

## 2023-08-20 ENCOUNTER — Ambulatory Visit: Payer: Medicare Other

## 2023-08-21 ENCOUNTER — Ambulatory Visit: Payer: Medicare Other

## 2023-08-22 ENCOUNTER — Ambulatory Visit: Payer: Medicare Other

## 2023-08-23 ENCOUNTER — Ambulatory Visit: Payer: Medicare Other

## 2023-08-24 ENCOUNTER — Ambulatory Visit: Payer: Medicare Other

## 2023-08-28 ENCOUNTER — Inpatient Hospital Stay: Payer: Medicare Other | Attending: Nurse Practitioner

## 2023-08-28 ENCOUNTER — Inpatient Hospital Stay: Payer: Medicare Other

## 2023-08-28 ENCOUNTER — Inpatient Hospital Stay (HOSPITAL_BASED_OUTPATIENT_CLINIC_OR_DEPARTMENT_OTHER): Payer: Medicare Other | Admitting: Hematology

## 2023-08-28 ENCOUNTER — Encounter: Payer: Self-pay | Admitting: Hematology

## 2023-08-28 VITALS — BP 108/75 | HR 76 | Temp 97.6°F | Resp 17 | Wt 104.7 lb

## 2023-08-28 DIAGNOSIS — C9310 Chronic myelomonocytic leukemia not having achieved remission: Secondary | ICD-10-CM | POA: Diagnosis not present

## 2023-08-28 DIAGNOSIS — Z79899 Other long term (current) drug therapy: Secondary | ICD-10-CM | POA: Diagnosis not present

## 2023-08-28 DIAGNOSIS — Z5111 Encounter for antineoplastic chemotherapy: Secondary | ICD-10-CM | POA: Insufficient documentation

## 2023-08-28 DIAGNOSIS — Z95828 Presence of other vascular implants and grafts: Secondary | ICD-10-CM

## 2023-08-28 DIAGNOSIS — D696 Thrombocytopenia, unspecified: Secondary | ICD-10-CM | POA: Diagnosis not present

## 2023-08-28 LAB — BASIC METABOLIC PANEL - CANCER CENTER ONLY
Anion gap: 9 (ref 5–15)
BUN: 29 mg/dL — ABNORMAL HIGH (ref 8–23)
CO2: 28 mmol/L (ref 22–32)
Calcium: 9.7 mg/dL (ref 8.9–10.3)
Chloride: 102 mmol/L (ref 98–111)
Creatinine: 0.99 mg/dL (ref 0.44–1.00)
GFR, Estimated: 59 mL/min — ABNORMAL LOW (ref >60)
Glucose, Bld: 95 mg/dL (ref 70–99)
Potassium: 4.1 mmol/L (ref 3.5–5.1)
Sodium: 139 mmol/L (ref 135–145)

## 2023-08-28 LAB — CBC WITH DIFFERENTIAL (CANCER CENTER ONLY)
Abs Immature Granulocytes: 0.1 10*3/uL — ABNORMAL HIGH (ref 0.00–0.07)
Basophils Absolute: 0.2 10*3/uL — ABNORMAL HIGH (ref 0.0–0.1)
Basophils Relative: 2 %
Eosinophils Absolute: 0.9 10*3/uL — ABNORMAL HIGH (ref 0.0–0.5)
Eosinophils Relative: 7 %
HCT: 32.2 % — ABNORMAL LOW (ref 36.0–46.0)
Hemoglobin: 10.4 g/dL — ABNORMAL LOW (ref 12.0–15.0)
Immature Granulocytes: 1 %
Lymphocytes Relative: 23 %
Lymphs Abs: 2.8 10*3/uL (ref 0.7–4.0)
MCH: 34.3 pg — ABNORMAL HIGH (ref 26.0–34.0)
MCHC: 32.3 g/dL (ref 30.0–36.0)
MCV: 106.3 fL — ABNORMAL HIGH (ref 80.0–100.0)
Monocytes Absolute: 3.5 10*3/uL — ABNORMAL HIGH (ref 0.1–1.0)
Monocytes Relative: 29 %
Neutro Abs: 4.6 10*3/uL (ref 1.7–7.7)
Neutrophils Relative %: 38 %
Platelet Count: 158 10*3/uL (ref 150–400)
RBC: 3.03 MIL/uL — ABNORMAL LOW (ref 3.87–5.11)
RDW: 15.2 % (ref 11.5–15.5)
WBC Count: 12.2 10*3/uL — ABNORMAL HIGH (ref 4.0–10.5)
nRBC: 0 % (ref 0.0–0.2)

## 2023-08-28 MED ORDER — ONDANSETRON HCL 4 MG/2ML IJ SOLN
8.0000 mg | Freq: Once | INTRAMUSCULAR | Status: AC
  Administered 2023-08-28: 8 mg via INTRAVENOUS
  Filled 2023-08-28: qty 4

## 2023-08-28 MED ORDER — HEPARIN SOD (PORK) LOCK FLUSH 100 UNIT/ML IV SOLN
500.0000 [IU] | Freq: Once | INTRAVENOUS | Status: AC | PRN
  Administered 2023-08-28: 500 [IU]

## 2023-08-28 MED ORDER — SODIUM CHLORIDE 0.9 % IV SOLN
75.0000 mg/m2 | Freq: Once | INTRAVENOUS | Status: AC
  Administered 2023-08-28: 114 mg via INTRAVENOUS
  Filled 2023-08-28: qty 11.4

## 2023-08-28 MED ORDER — SODIUM CHLORIDE 0.9 % IV SOLN: Freq: Once | INTRAVENOUS | Status: AC

## 2023-08-28 MED ORDER — SODIUM CHLORIDE 0.9% FLUSH
10.0000 mL | INTRAVENOUS | Status: DC | PRN
  Administered 2023-08-28: 10 mL

## 2023-08-28 MED ORDER — SODIUM CHLORIDE 0.9% FLUSH
10.0000 mL | Freq: Once | INTRAVENOUS | Status: AC
  Administered 2023-08-28: 10 mL

## 2023-08-28 NOTE — Patient Instructions (Signed)
[facility]  Discharge Instructions: Thank you for choosing [facility] to provide your oncology and hematology care.   If you have a lab appointment with the Cancer Center, please go directly to the Cancer Center and check in at the registration area.   Wear comfortable clothing and clothing appropriate for easy access to any Portacath or PICC line.   We strive to give you quality time with your provider. You may need to reschedule your appointment if you arrive late (15 or more minutes).  Arriving late affects you and other patients whose appointments are after yours.  Also, if you miss three or more appointments without notifying the office, you may be dismissed from the clinic at the provider's discretion.      For prescription refill requests, have your pharmacy contact our office and allow 72 hours for refills to be completed.    Today you received the following chemotherapy and/or immunotherapy agents vidaza       To help prevent nausea and vomiting after your treatment, we encourage you to take your nausea medication as directed.  BELOW ARE SYMPTOMS THAT SHOULD BE REPORTED IMMEDIATELY: *FEVER GREATER THAN 100.4 F (38 C) OR HIGHER *CHILLS OR SWEATING *NAUSEA AND VOMITING THAT IS NOT CONTROLLED WITH YOUR NAUSEA MEDICATION *UNUSUAL SHORTNESS OF BREATH *UNUSUAL BRUISING OR BLEEDING *URINARY PROBLEMS (pain or burning when urinating, or frequent urination) *BOWEL PROBLEMS (unusual diarrhea, constipation, pain near the anus) TENDERNESS IN MOUTH AND THROAT WITH OR WITHOUT PRESENCE OF ULCERS (sore throat, sores in mouth, or a toothache) UNUSUAL RASH, SWELLING OR PAIN  UNUSUAL VAGINAL DISCHARGE OR ITCHING   Items with * indicate a potential emergency and should be followed up as soon as possible or go to the Emergency Department if any problems should occur.  Please show the CHEMOTHERAPY ALERT CARD or IMMUNOTHERAPY ALERT CARD at  check-in to the Emergency Department and triage nurse.  Should you have questions after your visit or need to cancel or reschedule your appointment, please contact [facility]  Dept: [phone]  and follow the prompts.  Office hours are 8:00 a.m. to 4:30 p.m. Monday - Friday. Please note that voicemails left after 4:00 p.m. may not be returned until the following business day.  We are closed weekends and major holidays. You have access to a nurse at all times for urgent questions. Please call the main number to the clinic Dept: [phone] and follow the prompts.   For any non-urgent questions, you may also contact your provider using MyChart. We now offer e-Visits for anyone 18 and older to request care online for non-urgent symptoms. For details visit [email or web address].   Also download the MyChart app! Go to the app store, search "MyChart", open the app, select [place], and log in with your MyChart username and password.   

## 2023-08-28 NOTE — Progress Notes (Signed)
[facility]   Telephone:([phone]) [phone] Fax:(336) [phone]   Clinic Follow up Note   Patient Care Team: [name], MD as PCP - General (Internal Medicine) [name], Deloris Ping, MD as PCP - Cardiology (Cardiology) Zenovia Jordan, MD as Consulting Physician (Rheumatology) Malachy Mood, MD as Consulting Physician (Hematology) Charna Elizabeth, MD as Consulting Physician (Gastroenterology) Josephine Igo, DO as Consulting Physician (Pulmonary Disease)  Date of Service:  08/28/2023  CHIEF COMPLAINT: f/u of CMML     CURRENT THERAPY:  MYELODYSPLASIA Azacitidine IV D1-5 q 6 weeks   ASSESSMENT:  [name] is a 77 y.o. female with   CMML (chronic myelomonocytic leukemia) (HCC) diagnosed in 10/2021 ---She developed worsening back pain, pelvic MRI 07/04/21 showed a mass in the presacral space. 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 Her BM biopsy was reviewed at [facility] and was felt to be CMML-1 with 5% blasts.  -She began azacitadine on 02/06/22. she has had multiple hospital admissions and her performance status has been very low.  Changed her chemo to every 6 weeks in 08/2022 due to her fatigue. -she is tolerating every 6 weeks much better, feels better and able to do all her ADL now -she saw Dr. Sharyne Richters in Feb 2024, who recommends to continue current therapy  -Due to worsening low back pain, she completed palliative radiation on 07/02/2023 -Lab reviewed, adequate for treatment, will proceed with azacitidine today for 5 days and continue every 6 weeks  Thrombocytopenia (HCC) -Overall mild and intermittent, secondary to chemotherapy     PLAN: -lab -pending -CMP-pending -Pt will cont. Vidaza -lab /port flush in 3 weeks -lab/port flush and f/u in 6 weeks  SUMMARY OF ONCOLOGIC HISTORY: Oncology  History Overview Note   Cancer Staging  CMML (chronic myelomonocytic leukemia) (HCC) Staging form: Chronic Myeloid Leukemia, AJCC 8th Edition - Clinical stage from 01/12/2022: Bone marrow blast count (%): 12, Additional clonal changes: Unknown - Signed by Malachy Mood, MD on 01/12/2022 Stage prefix: Initial diagnosis     CMML (chronic myelomonocytic leukemia) (HCC)  11/09/2021 Initial Biopsy   DIAGNOSIS:   -  Monoclonal B-cell population with co-expression of CD5 comprises 17%  of all lymphocytes  -  See comment   COMMENT:  In addition to the clonal B-cell population, there is a myeloblast  population (CD34, CD38, HLA-DR, CD117, CD123 and CD33) that comprises 2% of the total cellular events.  Please see concurrent tissue biopsy (below) for additional work-up and final diagnosis.    FINAL MICROSCOPIC DIAGNOSIS:   A. SOFT TISSUE MASS, PRE SACRAL, NEEDLE CORE BIOPSY:  -  Chronic lymphocytic leukemia/small lymphocytic lymphoma  -  Extra medullary hematopoiesis  -  See comment   COMMENT:  The biopsy consists of multiple soft tissue cores with lymphoid nodules and a dense hematopoietic infiltrate consistent with extra medullary hematopoiesis.  MPO and E-cadherin highlight myeloid and erythroid precursors respectively.  CD34 highlights increased vasculature and is also positive within the cytoplasm of megakaryocytes.  A few small, immature mononuclear cells appear to be positive for CD34 and CD117. TdT shows rare, scattered positive cells.  CD20 highlights aggregates of B cells which are admixed with CD3 positive T cells.  T cells are an admixture of CD4 and CD8.  The B cells are also positive for CD5, CD23 and Bcl-2.  The B cells do not show significant staining for CD10,  BCL6 or cyclin D1.  CD138 highlights scattered plasma cells which are polytypic by kappa and lambda in situ hybridization.  Flow cytometry performed on the sample (see WL [id number]) identified a kappa restricted CD5 positive  B-cell population comprising 70% of lymphocytes.  In addition, a small myeloblast population comprised 2% of the total cellular events.   Overall, the findings are consistent with soft tissue involvement by  chronic lymphocytic leukemia/small lymphocytic lymphoma and extra medullary hematopoiesis. In reviewing the patient's CBC data (macrocytic anemia and thrombocytopenia), I would recommend a bone marrow biopsy to assess for marrow involvement by CLL/SLL.    12/23/2021 Imaging   EXAM: CT CHEST, ABDOMEN, AND PELVIS WITH CONTRAST  IMPRESSION: 1. Slight interval enlargement of a presacral soft tissue mass measuring 7.2 x 4.7 cm, previously 6.9 x 4.1 cm on prior MR of the pelvis dated 07/04/2021. By report, this represents a biopsy proven lymphoma. 2. Pleural nodule of the dependent right lower lobe overlying the posterior right tenth rib and pleural or paraspinous soft tissue mass overlying the right aspect of the T10 vertebral body, very slightly enlarged compared to prior examination of the chest dated 06/25/2020, consistent with additional sites of lymphomatous involvement given very indolent growth. These could be better assessed for metabolic activity by FDG PET/CT if desired. 3. There is mild, bibasilar predominant pulmonary fibrosis in a pattern featuring irregular peripheral interstitial opacity, septal thickening, but without clear evidence of subpleural bronchiolectasis or honeycombing, with a somewhat asymmetric distribution most conspicuously involving the right lower lobe and lingula. These findings are significantly worsened when compared to prior examination dated 06/25/2020, particularly in the right lower lobe. Given interval change, this may reflect sequelae of interval infection or aspiration, however appearance is generally suspicious for fibrotic interstitial lung disease, and if characterized by ATS pulmonary fibrosis is in an "indeterminate for UIP"  pattern, differential considerations including both UIP and NSIP. 4. Emphysema.   Aortic Atherosclerosis (ICD10-I70.0) and Emphysema (ICD10-J43.9).   01/05/2022 Pathology Results   DIAGNOSIS:   BONE MARROW, ASPIRATE, CLOT, CORE:  -Hypercellular bone marrow for age with features of  myelodysplastic/myeloproliferative neoplasm  -Minor abnormal B-cell population  -See comment   PERIPHERAL BLOOD:  -Macrocytic anemia  -Neutrophilic left shift and monocytosis  -Thrombocytopenia   COMMENT:  The bone marrow is hypercellular for age with dyspoietic changes  involving myeloid cell lines associated with monocytosis and increased number of blastic cells (12%) as primarily seen by morphology, many of which display monocytic features.  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  In this background, there are several predominantly small lymphoid aggregates mostly composed of small lymphoid cells.  By flow cytometry, a minor abnormal B-cell population expressing CD5 is seen and representing 2% of all cells.  This correlate with previously known B-cell lymphoproliferative process.  Correlation with cytogenetic and FISH studies is strongly recommended.    DIAGNOSIS:   -Increased number of monocytic cells present (25%)  -Minor abnormal B-cell population identified.  -See comment   COMMENT:  Flow cytometric analysis shows increased number of monocytic cells representing 25% of all cells but without aberrant phenotype or CD34 expression.  A significant CD34-positive blastic population is not identified.  The lymphoid population shows a minor B-cell population representing 2% of all cells and expressing B-cell antigens including CD20 associated with CD5, CD200 and possibly dim kappa expression.  The latter findings are abnormal and  correlate  with previously known B-cell lymphoproliferative process.  No significant T-cell phenotypic abnormalities identified.    01/12/2022 Initial Diagnosis   CMML (chronic myelomonocytic leukemia) (HCC)   01/12/2022 Cancer Staging   Staging form: Chronic Myeloid Leukemia, AJCC 8th Edition - Clinical stage from 01/12/2022: Bone marrow blast count (%): 12, Additional clonal changes: Unknown - Signed by Malachy Mood, MD on 01/12/2022 Stage prefix: Initial diagnosis   02/06/2022 - 08/11/2022 Chemotherapy   Patient is on Treatment Plan : MYELODYSPLASIA  Azacitidine IV D1-7 q28d     02/06/2022 -  Chemotherapy   Patient is on Treatment Plan : MYELODYSPLASIA  Azacitidine IV D1-7 q28d        INTERVAL HISTORY:  [name] is here for a follow up of CMML . She was last seen by me on 08/07/2023. She presents to the clinic accompanied by husband. Pt report of having some back pain , due to sitting to much. Pt state that  she takes tylenol for pain. Pt reports of having a good appetite, she denies having and BM issues.      All other systems were reviewed with the patient and are negative.  MEDICAL HISTORY:  Past Medical History:  Diagnosis Date   Arthritis    Rheumatoid arthritis   Celiac disease    Chronic kidney disease    stage 3 from MD notes   COPD (chronic obstructive pulmonary disease) (HCC)    Dyspnea    with going up stairs   Family history of adverse reaction to anesthesia    father had hard time waking up   Headache    sinus headaches   Hot flashes    Hypertension    Iron deficiency anemia    Pneumonia    per patient "I have walking pneumonia"    SURGICAL HISTORY: Past Surgical History:  Procedure Laterality Date   COLONOSCOPY     ECTOPIC PREGNANCY SURGERY      x 2   IR IMAGING GUIDED PORT INSERTION  01/31/2022   REVERSE SHOULDER ARTHROPLASTY Right 02/02/2017   Procedure: RIGHT REVERSE SHOULDER ARTHROPLASTY;  Surgeon: Beverely Low, MD;  Location: [facility] OR;  Service:  Orthopedics;  Laterality: Right;    I have reviewed the social history and family history with the patient and they are unchanged from previous note.  ALLERGIES:  is allergic to digoxin and related, gluten meal, penicillins, fexofenadine, alendronate, and hydroxychloroquine.  MEDICATIONS:  Current Outpatient Medications  Medication Sig Dispense Refill   acetaminophen-codeine (TYLENOL #3) 300-30 MG tablet Take 1 tablet by mouth every 6 (six) hours as needed for moderate pain. 20 tablet 0   apixaban (ELIQUIS) 5 MG TABS tablet Take 1 tablet (5 mg total) by mouth 2 (two) times daily. 60 tablet 11   denosumab (PROLIA) 60 MG/ML SOSY injection Inject 60 mg into the skin every 6 (six) months.     diltiazem (CARDIZEM CD) 360 MG 24 hr capsule Take 1 capsule (360 mg total) by mouth daily. 90 capsule 3   furosemide (LASIX) 20 MG tablet Take 1 tablet (20 mg total) by mouth daily. 30 tablet 11   gabapentin (NEURONTIN) 300 MG capsule Take 300 mg by mouth in the morning.     Metoprolol Tartrate 75 MG TABS Take 2 tablets (150 mg total) by mouth 2 (two) times daily. 360 tablet 2   pantoprazole (PROTONIX) 20 MG tablet Take 1 tablet (20 mg total) by mouth daily. For three weeks 21 tablet 0   potassium chloride SA (KLOR-CON  M20) 20 MEQ tablet Take 1 tablet (20 mEq total) by mouth daily. Take with furosemide 90 tablet 3   predniSONE (DELTASONE) 5 MG tablet Take 5 mg by mouth daily with breakfast.     Tiotropium Bromide-Olodaterol (STIOLTO RESPIMAT) 2.5-2.5 MCG/ACT AERS Inhale 2 puffs into the lungs daily. 4 g 11   No current facility-administered medications for this visit.   Facility-Administered Medications Ordered in Other Visits  Medication Dose Route Frequency Provider Last Rate Last Admin   acetaminophen (TYLENOL) 325 MG tablet            diphenhydrAMINE (BENADRYL) 25 mg capsule            sodium chloride flush (NS) 0.9 % injection 10 mL  10 mL Intracatheter PRN Malachy Mood, MD   10 mL at 08/28/23 1551     PHYSICAL EXAMINATION: ECOG PERFORMANCE STATUS: 2 - Symptomatic, <50% confined to bed  Vitals:   08/28/23 1357  BP: 108/75  Pulse: 76  Resp: 17  Temp: 97.6 F (36.4 C)  SpO2: 100%   Wt Readings from Last 3 Encounters:  08/28/23 104 lb 11.2 oz (47.5 kg)  08/07/23 105 lb 1.6 oz (47.7 kg)  07/18/23 106 lb 12 oz (48.4 kg)     GENERAL:alert, no distress and comfortable SKIN: skin color normal, no rashes or significant lesions EYES: normal, Conjunctiva are pink and non-injected, sclera clear  NEURO: alert & oriented x 3 with fluent speech  (-) lower extremity edema  LABORATORY DATA:  I have reviewed the data as listed    Latest Ref Rng & Units 08/28/2023    1:21 PM 08/07/2023   11:37 AM 07/16/2023    1:46 PM  CBC  WBC 4.0 - 10.5 K/uL 12.2  9.0  10.4   Hemoglobin 12.0 - 15.0 g/dL 16.1  9.8  9.4   Hematocrit 36.0 - 46.0 % 32.2  29.6  28.6   Platelets 150 - 400 K/uL 158  86  138         Latest Ref Rng & Units 08/28/2023    1:21 PM 08/07/2023   11:37 AM 07/16/2023    1:46 PM  CMP  Glucose 70 - 99 mg/dL 95  096  045   BUN 8 - 23 mg/dL 29  23  23    Creatinine 0.44 - 1.00 mg/dL 4.09  8.11  9.14   Sodium 135 - 145 mmol/L 139  140  133   Potassium 3.5 - 5.1 mmol/L 4.1  4.7  4.1   Chloride 98 - 111 mmol/L 102  102  98   CO2 22 - 32 mmol/L 28  30  27    Calcium 8.9 - 10.3 mg/dL 9.7  9.8  9.2   Total Protein 6.5 - 8.1 g/dL  6.1    Total Bilirubin 0.3 - 1.2 mg/dL  0.6    Alkaline Phos 38 - 126 U/L  65    AST 15 - 41 U/L  17    ALT 0 - 44 U/L  13        RADIOGRAPHIC STUDIES: I have personally reviewed the radiological images as listed and agreed with the findings in the report. No results found.    Orders Placed This Encounter  Procedures   CBC with Differential (Cancer Center Only)    Standing Status:   Future    Standing Expiration Date:   10/08/2024   Basic Metabolic Panel - Cancer Center Only    Standing Status:   Future  Standing Expiration Date:   10/08/2024    CBC with Differential (Cancer Center Only)    Standing Status:   Future    Standing Expiration Date:   11/19/2024   Basic Metabolic Panel - Cancer Center Only    Standing Status:   Future    Standing Expiration Date:   11/19/2024   All questions were answered. The patient knows to call the clinic with any problems, questions or concerns. No barriers to learning was detected. The total time spent in the appointment was 25 minutes.     Malachy Mood, MD 08/28/2023   Carolin Coy, CMA, am acting as scribe for Malachy Mood, MD.   I have reviewed the above documentation for accuracy and completeness, and I agree with the above.

## 2023-08-28 NOTE — Assessment & Plan Note (Signed)
diagnosed in 10/2021 ---She developed worsening back pain, pelvic MRI 07/04/21 showed a mass in the presacral space. 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 Her BM biopsy was reviewed at [facility] and was felt to be CMML-1 with 5% blasts.  -She began azacitadine on 02/06/22. she has had multiple hospital admissions and her performance status has been very low.  Changed her chemo to every 6 weeks in 08/2022 due to her fatigue. -she is tolerating every 6 weeks much better, feels better and able to do all her ADL now -she saw Dr. Sharyne Richters in Feb 2024, who recommends to continue current therapy  -Due to worsening low back pain, she completed palliative radiation on 07/02/2023 -Lab reviewed, adequate for treatment, will proceed with azacitidine today for 5 days and continue every 6 weeks

## 2023-08-28 NOTE — Assessment & Plan Note (Signed)
-  Overall mild and intermittent, secondary to chemotherapy

## 2023-08-29 ENCOUNTER — Inpatient Hospital Stay: Payer: Medicare Other

## 2023-08-29 ENCOUNTER — Encounter: Payer: Self-pay | Admitting: Hematology

## 2023-08-29 VITALS — BP 91/56 | HR 65 | Temp 97.9°F | Resp 17

## 2023-08-29 DIAGNOSIS — C9310 Chronic myelomonocytic leukemia not having achieved remission: Secondary | ICD-10-CM

## 2023-08-29 DIAGNOSIS — Z79899 Other long term (current) drug therapy: Secondary | ICD-10-CM | POA: Diagnosis not present

## 2023-08-29 DIAGNOSIS — Z5111 Encounter for antineoplastic chemotherapy: Secondary | ICD-10-CM | POA: Diagnosis not present

## 2023-08-29 MED ORDER — HEPARIN SOD (PORK) LOCK FLUSH 100 UNIT/ML IV SOLN
500.0000 [IU] | Freq: Once | INTRAVENOUS | Status: AC | PRN
  Administered 2023-08-29: 500 [IU]

## 2023-08-29 MED ORDER — SODIUM CHLORIDE 0.9% FLUSH
10.0000 mL | INTRAVENOUS | Status: DC | PRN
  Administered 2023-08-29: 10 mL

## 2023-08-29 MED ORDER — ONDANSETRON HCL 4 MG/2ML IJ SOLN
8.0000 mg | Freq: Once | INTRAMUSCULAR | Status: AC
  Administered 2023-08-29: 8 mg via INTRAVENOUS
  Filled 2023-08-29: qty 4

## 2023-08-29 MED ORDER — SODIUM CHLORIDE 0.9 % IV SOLN
75.0000 mg/m2 | Freq: Once | INTRAVENOUS | Status: AC
  Administered 2023-08-29: 114 mg via INTRAVENOUS
  Filled 2023-08-29: qty 11.4

## 2023-08-29 MED ORDER — SODIUM CHLORIDE 0.9 % IV SOLN: Freq: Once | INTRAVENOUS | Status: AC

## 2023-08-30 ENCOUNTER — Inpatient Hospital Stay: Payer: Medicare Other

## 2023-08-30 ENCOUNTER — Other Ambulatory Visit: Payer: Self-pay

## 2023-08-30 VITALS — BP 115/47 | HR 64 | Temp 97.9°F | Resp 18

## 2023-08-30 DIAGNOSIS — C9310 Chronic myelomonocytic leukemia not having achieved remission: Secondary | ICD-10-CM | POA: Diagnosis not present

## 2023-08-30 DIAGNOSIS — Z5111 Encounter for antineoplastic chemotherapy: Secondary | ICD-10-CM | POA: Diagnosis not present

## 2023-08-30 DIAGNOSIS — Z79899 Other long term (current) drug therapy: Secondary | ICD-10-CM | POA: Diagnosis not present

## 2023-08-30 MED ORDER — SODIUM CHLORIDE 0.9 % IV SOLN: Freq: Once | INTRAVENOUS | Status: AC

## 2023-08-30 MED ORDER — SODIUM CHLORIDE 0.9% FLUSH
10.0000 mL | INTRAVENOUS | Status: DC | PRN
  Administered 2023-08-30: 10 mL

## 2023-08-30 MED ORDER — ONDANSETRON HCL 4 MG/2ML IJ SOLN
8.0000 mg | Freq: Once | INTRAMUSCULAR | Status: AC
  Administered 2023-08-30: 8 mg via INTRAVENOUS
  Filled 2023-08-30: qty 4

## 2023-08-30 MED ORDER — HEPARIN SOD (PORK) LOCK FLUSH 100 UNIT/ML IV SOLN
500.0000 [IU] | Freq: Once | INTRAVENOUS | Status: AC | PRN
  Administered 2023-08-30: 500 [IU]

## 2023-08-30 MED ORDER — SODIUM CHLORIDE 0.9 % IV SOLN
75.0000 mg/m2 | Freq: Once | INTRAVENOUS | Status: AC
  Administered 2023-08-30: 114 mg via INTRAVENOUS
  Filled 2023-08-30: qty 11.4

## 2023-08-30 NOTE — Patient Instructions (Signed)
[facility]  Discharge Instructions: Thank you for choosing [facility] to provide your oncology and hematology care.   If you have a lab appointment with the Cancer Center, please go directly to the Cancer Center and check in at the registration area.   Wear comfortable clothing and clothing appropriate for easy access to any Portacath or PICC line.   We strive to give you quality time with your provider. You may need to reschedule your appointment if you arrive late (15 or more minutes).  Arriving late affects you and other patients whose appointments are after yours.  Also, if you miss three or more appointments without notifying the office, you may be dismissed from the clinic at the provider's discretion.      For prescription refill requests, have your pharmacy contact our office and allow 72 hours for refills to be completed.    Today you received the following chemotherapy and/or immunotherapy agents: azacitidine      To help prevent nausea and vomiting after your treatment, we encourage you to take your nausea medication as directed.  BELOW ARE SYMPTOMS THAT SHOULD BE REPORTED IMMEDIATELY: *FEVER GREATER THAN 100.4 F (38 C) OR HIGHER *CHILLS OR SWEATING *NAUSEA AND VOMITING THAT IS NOT CONTROLLED WITH YOUR NAUSEA MEDICATION *UNUSUAL SHORTNESS OF BREATH *UNUSUAL BRUISING OR BLEEDING *URINARY PROBLEMS (pain or burning when urinating, or frequent urination) *BOWEL PROBLEMS (unusual diarrhea, constipation, pain near the anus) TENDERNESS IN MOUTH AND THROAT WITH OR WITHOUT PRESENCE OF ULCERS (sore throat, sores in mouth, or a toothache) UNUSUAL RASH, SWELLING OR PAIN  UNUSUAL VAGINAL DISCHARGE OR ITCHING   Items with * indicate a potential emergency and should be followed up as soon as possible or go to the Emergency Department if any problems should occur.  Please show the CHEMOTHERAPY ALERT CARD or IMMUNOTHERAPY ALERT CARD at  check-in to the Emergency Department and triage nurse.  Should you have questions after your visit or need to cancel or reschedule your appointment, please contact [facility]  Dept: [phone]  and follow the prompts.  Office hours are 8:00 a.m. to 4:30 p.m. Monday - Friday. Please note that voicemails left after 4:00 p.m. may not be returned until the following business day.  We are closed weekends and major holidays. You have access to a nurse at all times for urgent questions. Please call the main number to the clinic Dept: [phone] and follow the prompts.   For any non-urgent questions, you may also contact your provider using MyChart. We now offer e-Visits for anyone 18 and older to request care online for non-urgent symptoms. For details visit [email or web address].   Also download the MyChart app! Go to the app store, search "MyChart", open the app, select [place], and log in with your MyChart username and password.   

## 2023-08-31 ENCOUNTER — Inpatient Hospital Stay: Payer: Medicare Other

## 2023-08-31 VITALS — BP 99/52 | HR 73 | Temp 97.7°F | Resp 18 | Wt 106.8 lb

## 2023-08-31 DIAGNOSIS — C9310 Chronic myelomonocytic leukemia not having achieved remission: Secondary | ICD-10-CM

## 2023-08-31 DIAGNOSIS — Z5111 Encounter for antineoplastic chemotherapy: Secondary | ICD-10-CM | POA: Diagnosis not present

## 2023-08-31 DIAGNOSIS — Z79899 Other long term (current) drug therapy: Secondary | ICD-10-CM | POA: Diagnosis not present

## 2023-08-31 MED ORDER — SODIUM CHLORIDE 0.9 % IV SOLN
75.0000 mg/m2 | Freq: Once | INTRAVENOUS | Status: AC
  Administered 2023-08-31: 114 mg via INTRAVENOUS
  Filled 2023-08-31: qty 11.4

## 2023-08-31 MED ORDER — HEPARIN SOD (PORK) LOCK FLUSH 100 UNIT/ML IV SOLN
500.0000 [IU] | Freq: Once | INTRAVENOUS | Status: AC | PRN
  Administered 2023-08-31: 500 [IU]

## 2023-08-31 MED ORDER — SODIUM CHLORIDE 0.9 % IV SOLN: Freq: Once | INTRAVENOUS | Status: AC

## 2023-08-31 MED ORDER — SODIUM CHLORIDE 0.9% FLUSH
10.0000 mL | INTRAVENOUS | Status: DC | PRN
  Administered 2023-08-31: 10 mL

## 2023-08-31 MED ORDER — ONDANSETRON HCL 4 MG/2ML IJ SOLN
8.0000 mg | Freq: Once | INTRAMUSCULAR | Status: AC
  Administered 2023-08-31: 8 mg via INTRAVENOUS
  Filled 2023-08-31: qty 4

## 2023-08-31 NOTE — Patient Instructions (Signed)
[facility] [facility]  Discharge Instructions: Thank you for choosing [facility] to provide your oncology and hematology care.   If you have a lab appointment with the Cancer Center, please go directly to the Cancer Center and check in at the registration area.   Wear comfortable clothing and clothing appropriate for easy access to any Portacath or PICC line.   We strive to give you quality time with your provider. You may need to reschedule your appointment if you arrive late (15 or more minutes).  Arriving late affects you and other patients whose appointments are after yours.  Also, if you miss three or more appointments without notifying the office, you may be dismissed from the clinic at the provider's discretion.      For prescription refill requests, have your pharmacy contact our office and allow 72 hours for refills to be completed.    Today you received the following chemotherapy and/or immunotherapy agents :  Azacitidine.   To help prevent nausea and vomiting after your treatment, we encourage you to take your nausea medication as directed.  BELOW ARE SYMPTOMS THAT SHOULD BE REPORTED IMMEDIATELY: *FEVER GREATER THAN 100.4 F (38 C) OR HIGHER *CHILLS OR SWEATING *NAUSEA AND VOMITING THAT IS NOT CONTROLLED WITH YOUR NAUSEA MEDICATION *UNUSUAL SHORTNESS OF BREATH *UNUSUAL BRUISING OR BLEEDING *URINARY PROBLEMS (pain or burning when urinating, or frequent urination) *BOWEL PROBLEMS (unusual diarrhea, constipation, pain near the anus) TENDERNESS IN MOUTH AND THROAT WITH OR WITHOUT PRESENCE OF ULCERS (sore throat, sores in mouth, or a toothache) UNUSUAL RASH, SWELLING OR PAIN  UNUSUAL VAGINAL DISCHARGE OR ITCHING   Items with * indicate a potential emergency and should be followed up as soon as possible or go to the Emergency Department if any problems should occur.  Please show the CHEMOTHERAPY ALERT CARD or IMMUNOTHERAPY ALERT CARD at  check-in to the Emergency Department and triage nurse.  Should you have questions after your visit or need to cancel or reschedule your appointment, please contact [facility]  Dept: [phone]  and follow the prompts.  Office hours are 8:00 a.m. to 4:30 p.m. Monday - Friday. Please note that voicemails left after 4:00 p.m. may not be returned until the following business day.  We are closed weekends and major holidays. You have access to a nurse at all times for urgent questions. Please call the main number to the clinic Dept: [phone] and follow the prompts.   For any non-urgent questions, you may also contact your provider using MyChart. We now offer e-Visits for anyone 24 and older to request care online for non-urgent symptoms. For details visit [email or web address].   Also download the MyChart app! Go to the app store, search "MyChart", open the app, select [facility], and log in with your MyChart username and password.

## 2023-09-03 ENCOUNTER — Inpatient Hospital Stay: Payer: Medicare Other

## 2023-09-03 VITALS — BP 105/63 | HR 88 | Temp 97.8°F | Resp 18 | Wt 107.0 lb

## 2023-09-03 DIAGNOSIS — Z5111 Encounter for antineoplastic chemotherapy: Secondary | ICD-10-CM | POA: Diagnosis not present

## 2023-09-03 DIAGNOSIS — Z79899 Other long term (current) drug therapy: Secondary | ICD-10-CM | POA: Diagnosis not present

## 2023-09-03 DIAGNOSIS — C9310 Chronic myelomonocytic leukemia not having achieved remission: Secondary | ICD-10-CM | POA: Diagnosis not present

## 2023-09-03 MED ORDER — HEPARIN SOD (PORK) LOCK FLUSH 100 UNIT/ML IV SOLN
500.0000 [IU] | Freq: Once | INTRAVENOUS | Status: AC | PRN
  Administered 2023-09-03: 500 [IU]

## 2023-09-03 MED ORDER — SODIUM CHLORIDE 0.9 % IV SOLN
75.0000 mg/m2 | Freq: Once | INTRAVENOUS | Status: AC
  Administered 2023-09-03: 114 mg via INTRAVENOUS
  Filled 2023-09-03: qty 11.4

## 2023-09-03 MED ORDER — SODIUM CHLORIDE 0.9 % IV SOLN: Freq: Once | INTRAVENOUS | Status: AC

## 2023-09-03 MED ORDER — SODIUM CHLORIDE 0.9% FLUSH
10.0000 mL | INTRAVENOUS | Status: DC | PRN
  Administered 2023-09-03: 10 mL

## 2023-09-03 MED ORDER — ONDANSETRON HCL 4 MG/2ML IJ SOLN
8.0000 mg | Freq: Once | INTRAMUSCULAR | Status: AC
  Administered 2023-09-03: 8 mg via INTRAVENOUS
  Filled 2023-09-03: qty 4

## 2023-09-03 NOTE — Patient Instructions (Signed)
[facility]  Discharge Instructions: Thank you for choosing [facility] to provide your oncology and hematology care.   If you have a lab appointment with the Cancer Center, please go directly to the Cancer Center and check in at the registration area.   Wear comfortable clothing and clothing appropriate for easy access to any Portacath or PICC line.   We strive to give you quality time with your provider. You may need to reschedule your appointment if you arrive late (15 or more minutes).  Arriving late affects you and other patients whose appointments are after yours.  Also, if you miss three or more appointments without notifying the office, you may be dismissed from the clinic at the provider's discretion.      For prescription refill requests, have your pharmacy contact our office and allow 72 hours for refills to be completed.    Today you received the following chemotherapy and/or immunotherapy agents: Vidaza     To help prevent nausea and vomiting after your treatment, we encourage you to take your nausea medication as directed.  BELOW ARE SYMPTOMS THAT SHOULD BE REPORTED IMMEDIATELY: *FEVER GREATER THAN 100.4 F (38 C) OR HIGHER *CHILLS OR SWEATING *NAUSEA AND VOMITING THAT IS NOT CONTROLLED WITH YOUR NAUSEA MEDICATION *UNUSUAL SHORTNESS OF BREATH *UNUSUAL BRUISING OR BLEEDING *URINARY PROBLEMS (pain or burning when urinating, or frequent urination) *BOWEL PROBLEMS (unusual diarrhea, constipation, pain near the anus) TENDERNESS IN MOUTH AND THROAT WITH OR WITHOUT PRESENCE OF ULCERS (sore throat, sores in mouth, or a toothache) UNUSUAL RASH, SWELLING OR PAIN  UNUSUAL VAGINAL DISCHARGE OR ITCHING   Items with * indicate a potential emergency and should be followed up as soon as possible or go to the Emergency Department if any problems should occur.  Please show the CHEMOTHERAPY ALERT CARD or IMMUNOTHERAPY ALERT CARD at check-in  to the Emergency Department and triage nurse.  Should you have questions after your visit or need to cancel or reschedule your appointment, please contact [facility]  Dept: [phone]  and follow the prompts.  Office hours are 8:00 a.m. to 4:30 p.m. Monday - Friday. Please note that voicemails left after 4:00 p.m. may not be returned until the following business day.  We are closed weekends and major holidays. You have access to a nurse at all times for urgent questions. Please call the main number to the clinic Dept: [phone] and follow the prompts.   For any non-urgent questions, you may also contact your provider using MyChart. We now offer e-Visits for anyone 18 and older to request care online for non-urgent symptoms. For details visit [email or web address].   Also download the MyChart app! Go to the app store, search "MyChart", open the app, select [place], and log in with your MyChart username and password.  

## 2023-09-07 NOTE — Progress Notes (Addendum)
Radiation Oncology         [phone] ________________________________  Name: [name] MRN: [id number]  Date of Service: 09/07/2023  DOB: 26-Aug-1946  Post Treatment Telephone Note  Diagnosis:  Chronic Myelomonocytic Leukemia(as documented in provider EOT note)  Site-Pelvis  The patient was available for call today.   Symptoms of fatigue have improved since completing therapy.  Pain has improved.   Symptoms of skin changes have improved since completing therapy.  Symptoms of nausea or vomiting have improved since completing therapy.   The patient has scheduled follow up with her medical oncologist Dr. Mosetta Putt for ongoing surveillance, and was encouraged to call if she develops concerns or questions regarding radiation.    This concludes the interaction.  Ruel Favors, LPN

## 2023-09-10 ENCOUNTER — Ambulatory Visit
Admission: RE | Admit: 2023-09-10 | Discharge: 2023-09-10 | Disposition: A | Discharge status: Home / Self Care | Payer: Medicare Other | Source: Ambulatory Visit | Attending: Hematology | Admitting: Hematology

## 2023-09-11 DIAGNOSIS — Z79899 Other long term (current) drug therapy: Secondary | ICD-10-CM | POA: Diagnosis not present

## 2023-09-11 DIAGNOSIS — M0579 Rheumatoid arthritis with rheumatoid factor of multiple sites without organ or systems involvement: Secondary | ICD-10-CM | POA: Diagnosis not present

## 2023-09-13 DIAGNOSIS — Z1231 Encounter for screening mammogram for malignant neoplasm of breast: Secondary | ICD-10-CM | POA: Diagnosis not present

## 2023-09-14 DIAGNOSIS — Z23 Encounter for immunization: Secondary | ICD-10-CM | POA: Diagnosis not present

## 2023-09-19 ENCOUNTER — Inpatient Hospital Stay: Payer: Medicare Other

## 2023-09-19 DIAGNOSIS — Z5111 Encounter for antineoplastic chemotherapy: Secondary | ICD-10-CM | POA: Diagnosis not present

## 2023-09-19 DIAGNOSIS — C9310 Chronic myelomonocytic leukemia not having achieved remission: Secondary | ICD-10-CM | POA: Diagnosis not present

## 2023-09-19 DIAGNOSIS — Z95828 Presence of other vascular implants and grafts: Secondary | ICD-10-CM

## 2023-09-19 DIAGNOSIS — Z79899 Other long term (current) drug therapy: Secondary | ICD-10-CM | POA: Diagnosis not present

## 2023-09-19 LAB — CMP (CANCER CENTER ONLY)
ALT: 15 U/L (ref 0–44)
AST: 18 U/L (ref 15–41)
Albumin: 4.2 g/dL (ref 3.5–5.0)
Alkaline Phosphatase: 70 U/L (ref 38–126)
Anion gap: 8 (ref 5–15)
BUN: 17 mg/dL (ref 8–23)
CO2: 28 mmol/L (ref 22–32)
Calcium: 9.8 mg/dL (ref 8.9–10.3)
Chloride: 103 mmol/L (ref 98–111)
Creatinine: 0.97 mg/dL (ref 0.44–1.00)
GFR, Estimated: >60 mL/min (ref >60)
Glucose, Bld: 107 mg/dL — ABNORMAL HIGH (ref 70–99)
Potassium: 4.3 mmol/L (ref 3.5–5.1)
Sodium: 139 mmol/L (ref 135–145)
Total Bilirubin: 0.8 mg/dL (ref 0.3–1.2)
Total Protein: 6.4 g/dL — ABNORMAL LOW (ref 6.5–8.1)

## 2023-09-19 LAB — CBC WITH DIFFERENTIAL (CANCER CENTER ONLY)
Abs Immature Granulocytes: 0.02 10*3/uL (ref 0.00–0.07)
Basophils Absolute: 0.1 10*3/uL (ref 0.0–0.1)
Basophils Relative: 1 %
Eosinophils Absolute: 0 10*3/uL (ref 0.0–0.5)
Eosinophils Relative: 1 %
HCT: 30.6 % — ABNORMAL LOW (ref 36.0–46.0)
Hemoglobin: 9.8 g/dL — ABNORMAL LOW (ref 12.0–15.0)
Immature Granulocytes: 0 %
Lymphocytes Relative: 30 %
Lymphs Abs: 1.6 10*3/uL (ref 0.7–4.0)
MCH: 34.4 pg — ABNORMAL HIGH (ref 26.0–34.0)
MCHC: 32 g/dL (ref 30.0–36.0)
MCV: 107.4 fL — ABNORMAL HIGH (ref 80.0–100.0)
Monocytes Absolute: 1.1 10*3/uL — ABNORMAL HIGH (ref 0.1–1.0)
Monocytes Relative: 21 %
Neutro Abs: 2.5 10*3/uL (ref 1.7–7.7)
Neutrophils Relative %: 47 %
Platelet Count: 75 10*3/uL — ABNORMAL LOW (ref 150–400)
RBC: 2.85 MIL/uL — ABNORMAL LOW (ref 3.87–5.11)
RDW: 16.5 % — ABNORMAL HIGH (ref 11.5–15.5)
WBC Count: 5.2 10*3/uL (ref 4.0–10.5)
nRBC: 0 % (ref 0.0–0.2)

## 2023-09-19 MED ORDER — SODIUM CHLORIDE 0.9% FLUSH
10.0000 mL | Freq: Once | INTRAVENOUS | Status: AC
  Administered 2023-09-19: 10 mL

## 2023-09-19 MED ORDER — HEPARIN SOD (PORK) LOCK FLUSH 100 UNIT/ML IV SOLN
500.0000 [IU] | Freq: Once | INTRAVENOUS | Status: AC
  Administered 2023-09-19: 500 [IU]

## 2023-09-26 DIAGNOSIS — Z79899 Other long term (current) drug therapy: Secondary | ICD-10-CM | POA: Diagnosis not present

## 2023-09-26 DIAGNOSIS — M0579 Rheumatoid arthritis with rheumatoid factor of multiple sites without organ or systems involvement: Secondary | ICD-10-CM | POA: Diagnosis not present

## 2023-09-26 DIAGNOSIS — M1991 Primary osteoarthritis, unspecified site: Secondary | ICD-10-CM | POA: Diagnosis not present

## 2023-09-26 DIAGNOSIS — Z681 Body mass index (BMI) 19 or less, adult: Secondary | ICD-10-CM | POA: Diagnosis not present

## 2023-09-26 DIAGNOSIS — C9210 Chronic myeloid leukemia, BCR/ABL-positive, not having achieved remission: Secondary | ICD-10-CM | POA: Diagnosis not present

## 2023-09-26 DIAGNOSIS — E790 Hyperuricemia without signs of inflammatory arthritis and tophaceous disease: Secondary | ICD-10-CM | POA: Diagnosis not present

## 2023-09-26 DIAGNOSIS — S46012D Strain of muscle(s) and tendon(s) of the rotator cuff of left shoulder, subsequent encounter: Secondary | ICD-10-CM | POA: Diagnosis not present

## 2023-09-27 DIAGNOSIS — C9210 Chronic myeloid leukemia, BCR/ABL-positive, not having achieved remission: Secondary | ICD-10-CM | POA: Diagnosis not present

## 2023-09-27 DIAGNOSIS — C9311 Chronic myelomonocytic leukemia, in remission: Secondary | ICD-10-CM | POA: Diagnosis not present

## 2023-09-27 NOTE — Progress Notes (Signed)
Cardiology Office Note:    Date:  09/28/2023   ID:  [name], DOB [birth year], MRN [id number]  PCP:  [name], MD   [facility] Providers Cardiologist:  Kristeen Miss, MD     Referring MD: Maurice Small, MD   Chief Complaint  Patient presents with   Atrial Fibrillation   Leg Swelling    Oct. 11, 2022   [name] is a 77 y.o. female with a hx of back pain,  CKD, HLD, COPD, paroxysmal A. fib.  We are asked to see her today by Dr. Valentina Lucks for further evaluation and management of her paroxysmal atrial fibrillation.  She has not yet been started on anticoagulation. CHADS2VASC is 14 ( female, age, HTN)   No hx of CVA, CHF, DM , no vascular disease    Asymptomatic  Cannot tell when her HR is irreg.  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 Valentina Lucks indicate a previous GI bleed and colonoscopy with Dr Loreta Ave - she does not recall this .   Jan. 17, 2023 : Seen for her PAF  Is due for her colonoscopy   Instructed her to hold eliquis for 2 days  No symptoms that she could say is Afib   Apr 28, 2022:  [name] is seen for follow up of her PAF. 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 Hx of COPD   Was in the hospital 2 weeks ago .  Found to have severe anemia and rapid afib .  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 5, 2023 [name] is seen for follow up of her atrial fib,  anemia,  failure to thrive, COPD,  CML Wt is 114 lbs.   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 10, 2024  Is gaining her strength.   Eating a little better.  Is perhaps a litte stronger  Wt today is 106 lbs  Takes chemo every 6 weeks .   March 27, 2023 [name] is seen for her HTN, failure to thrive , COPD  On chemo for her CML Wt is 105 lb Trying to eat    Leg edema Walked in with a walker today   No CP , no dyspnea   Overall she looks good   Oct. 4, 2024 [name] is seen for follow up of her atrial fib, leg edema  Wt is 106 lbs  Walking with a walker today  Doing well according to her husband     Past Medical History:  Diagnosis Date   Arthritis    Rheumatoid arthritis   Celiac disease    Chronic kidney disease  stage 3 from MD notes   COPD (chronic obstructive pulmonary disease) (HCC)    Dyspnea    with going up stairs   Family history of adverse reaction to anesthesia    father had hard time waking up   Headache    sinus headaches   Hot flashes    Hypertension    Iron deficiency anemia    Pneumonia    per patient "I have walking pneumonia"    Past Surgical History:  Procedure Laterality Date   COLONOSCOPY     ECTOPIC PREGNANCY SURGERY      x 2   IR IMAGING GUIDED PORT INSERTION  01/31/2022   REVERSE SHOULDER ARTHROPLASTY Right 02/02/2017   Procedure: RIGHT REVERSE SHOULDER ARTHROPLASTY;  Surgeon: Beverely Low, MD;  Location: [facility] OR;  Service: Orthopedics;  Laterality: Right;    Current Medications: Current Meds  Medication Sig   acetaminophen-codeine (TYLENOL #3) 300-30 MG tablet Take 1 tablet by mouth every 6 (six) hours as needed for moderate pain.   apixaban (ELIQUIS) 5 MG TABS tablet Take 1 tablet (5 mg total) by mouth 2 (two) times daily.   denosumab (PROLIA) 60 MG/ML SOSY injection Inject 60 mg into the skin every 6 (six) months.    diltiazem (CARDIZEM CD) 360 MG 24 hr capsule Take 1 capsule (360 mg total) by mouth daily.   gabapentin (NEURONTIN) 300 MG capsule Take 300 mg by mouth in the morning.   Metoprolol Tartrate 75 MG TABS Take 2 tablets (150 mg total) by mouth 2 (two) times daily.   potassium chloride SA (KLOR-CON M20) 20 MEQ tablet Take 1 tablet (20 mEq total) by mouth daily. Take with furosemide   predniSONE (DELTASONE) 5 MG tablet Take 5 mg by mouth daily with breakfast.   Tiotropium Bromide-Olodaterol (STIOLTO RESPIMAT) 2.5-2.5 MCG/ACT AERS Inhale 2 puffs into the lungs daily.     Allergies:   Digoxin and related, Gluten meal, Penicillins, Fexofenadine, Alendronate, and Hydroxychloroquine   Social History   Socioeconomic History   Marital status: Married    Spouse name: Not on file   Number of children: Not on file   Years of education: Not on file   Highest education level: Not on file  Occupational History   Not on file  Tobacco Use   Smoking status: Former    Current packs/day: 0.00    Average packs/day: 1 pack/day for 30.0 years (30.0 ttl pk-yrs)    Types: Cigarettes    Start date: 7    Quit date: 2005    Years since quitting: 19.7   Smokeless tobacco: Never  Vaping Use   Vaping status: Never Used  Substance and Sexual Activity   Alcohol use: Yes    Alcohol/week: 12.0 - 14.0 standard drinks of alcohol    Types: 12 - 14 Glasses of wine per week   Drug use: No   Sexual activity: Not Currently  Other Topics Concern   Not on file  Social History Narrative   Not on file   Social Determinants of Health   Financial Resource Strain: Not on file  Food Insecurity: No Food Insecurity (06/08/2023)   Hunger Vital Sign    Worried About Running Out of Food in the Last Year: Never true    Ran Out of Food in the Last Year: Never true  Transportation Needs: No Transportation Needs (06/08/2023)   PRAPARE - Administrator, Civil Service (Medical): No    Lack of Transportation  (Non-Medical): No  Physical Activity: Not on file  Stress: Not on file  Social Connections: Not on file     Family History: The patient's family history includes Diabetes in her father; Peripheral Artery Disease in her father.  ROS:   Please see the history of present illness.     All other systems reviewed and are negative.  EKGs/Labs/Other Studies Reviewed:    The following studies were reviewed today: - previous ecgs     Recent Labs: 09/19/2023: ALT 15; BUN 17; Creatinine 0.97; Hemoglobin 9.8; Platelet Count 75; Potassium 4.3; Sodium 139  Recent Lipid Panel No results found for: "CHOL", "TRIG", "HDL", "CHOLHDL", "VLDL", "LDLCALC", "LDLDIRECT"   Risk Assessment/Calculations:    CHA2DS2-VASc Score =    } This indicates a  % annual risk of stroke. The patient's score is based upon:           Physical Exam:      Physical Exam: Blood pressure 102/66, pulse 66, height 5\' 3"  (1.6 m), weight 106 lb (48.1 kg), SpO2 98%.       GEN: Elderly, chronically ill-appearing patient examined in wheelchair.  In no acute distress HEENT: Normal NECK: No JVD; No carotid bruits LYMPHATICS: No lymphadenopathy CARDIAC: irreg. Irreg .    RESPIRATORY:  Clear to auscultation without rales, wheezing or rhonchi  ABDOMEN: Soft, non-tender, non-distended MUSCULOSKELETAL:  trace leg edema ,   low muscle mass  SKIN: Warm and dry NEUROLOGIC:  Alert and oriented x 3    EKG:     ASSESSMENT:  / Plan      2.  Atrial fib:  is in Afib today .  No changes in meds / plans    2.  Leg edema:   has chornic leg edema .   Does not exercise at all  Cont .  Current meds       3.  Generalized failure to thrive:   seems to be stable for now   4.  Right pleural effusion :    resolved        Medication Adjustments/Labs and Tests Ordered: Current medicines are reviewed at length with the patient today.  Concerns regarding medicines are outlined above.  No orders of the defined types were  placed in this encounter.   No orders of the defined types were placed in this encounter.    Patient Instructions  Medication Instructions:  Your physician recommends that you continue on your current medications as directed. Please refer to the Current Medication list given to you today.  *If you need a refill on your cardiac medications before your next appointment, please call your pharmacy*   Lab Work: NONE If you have labs (blood work) drawn today and your tests are completely normal, you will receive your results only by: MyChart Message (if you have MyChart) OR A paper copy in the mail If you have any lab test that is abnormal or we need to change your treatment, we will call you to review the results.   Testing/Procedures: NONE   Follow-Up: At [facility], you and your health needs are our priority.  As part of our continuing mission to provide you with exceptional heart care, we have created designated Provider Care Teams.  These Care Teams include your primary Cardiologist (physician) and Advanced Practice Providers (APPs -  Physician Assistants and Nurse Practitioners) who all work together to provide you with the care you need, when you need it.  We recommend signing up for the patient portal called "  MyChart".  Sign up information is provided on this After Visit Summary.  MyChart is used to connect with patients for Virtual Visits (Telemedicine).  Patients are able to view lab/test results, encounter notes, upcoming appointments, etc.  Non-urgent messages can be sent to your provider as well.   To learn more about what you can do with MyChart, go to [email or web address].    Your next appointment:   1 year(s)  Provider:   Odis Hollingshead, MD       Signed, Kristeen Miss, MD  09/28/2023 4:54 PM    [facility]

## 2023-09-28 ENCOUNTER — Encounter: Payer: Self-pay | Admitting: Cardiovascular Disease

## 2023-09-28 ENCOUNTER — Ambulatory Visit: Payer: Medicare Other | Attending: Cardiovascular Disease | Admitting: Cardiovascular Disease

## 2023-09-28 VITALS — BP 102/66 | HR 66 | Ht 63.0 in | Wt 106.0 lb

## 2023-09-28 DIAGNOSIS — I4819 Other persistent atrial fibrillation: Secondary | ICD-10-CM | POA: Diagnosis not present

## 2023-09-28 DIAGNOSIS — R600 Localized edema: Secondary | ICD-10-CM | POA: Insufficient documentation

## 2023-09-28 NOTE — Patient Instructions (Signed)
Medication Instructions:  Your physician recommends that you continue on your current medications as directed. Please refer to the Current Medication list given to you today.  *If you need a refill on your cardiac medications before your next appointment, please call your pharmacy*   Lab Work: NONE If you have labs (blood work) drawn today and your tests are completely normal, you will receive your results only by: MyChart Message (if you have MyChart) OR A paper copy in the mail If you have any lab test that is abnormal or we need to change your treatment, we will call you to review the results.   Testing/Procedures: NONE   Follow-Up: At [facility], you and your health needs are our priority.  As part of our continuing mission to provide you with exceptional heart care, we have created designated Provider Care Teams.  These Care Teams include your primary Cardiologist (physician) and Advanced Practice Providers (APPs -  Physician Assistants and Nurse Practitioners) who all work together to provide you with the care you need, when you need it.  We recommend signing up for the patient portal called "MyChart".  Sign up information is provided on this After Visit Summary.  MyChart is used to connect with patients for Virtual Visits (Telemedicine).  Patients are able to view lab/test results, encounter notes, upcoming appointments, etc.  Non-urgent messages can be sent to your provider as well.   To learn more about what you can do with MyChart, go to [email or web address].    Your next appointment:   1 year(s)  Provider:   Odis Hollingshead, MD

## 2023-09-30 ENCOUNTER — Other Ambulatory Visit: Payer: Self-pay

## 2023-10-03 ENCOUNTER — Telehealth: Payer: Self-pay | Admitting: Hematology

## 2023-10-03 ENCOUNTER — Encounter: Payer: Self-pay | Admitting: Hematology

## 2023-10-03 ENCOUNTER — Other Ambulatory Visit: Payer: Self-pay

## 2023-10-03 NOTE — Progress Notes (Unsigned)
Patient Care Team: [name], MD as PCP - General (Internal Medicine) [name], Deloris Ping, MD as PCP - Cardiology (Cardiology) Zenovia Jordan, MD as Consulting Physician (Rheumatology) Malachy Mood, MD as Consulting Physician (Hematology) Charna Elizabeth, MD as Consulting Physician (Gastroenterology) Josephine Igo, DO as Consulting Physician (Pulmonary Disease)  Clinic Day:  10/03/2023  Referring physician: [name], MD  ASSESSMENT & PLAN:   Assessment & Plan: CMML (chronic myelomonocytic leukemia) (HCC) diagnosed in 10/2021 ---She developed worsening back pain. Pelvic MRI 07/04/21 showed a mass in the presacral space. 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 Her BM biopsy was reviewed at [facility] and was felt to be CMML-1 with 5% blasts.  -She began azacitadine on 02/06/22. she has had multiple hospital admissions and her performance status has been very low.  Changed her chemo to every 6 weeks in 08/2022 due to her fatigue. -she is tolerating every 6 weeks much better, feels better and able to do all her ADL now -she saw Dr. Sharyne Richters in Feb 2024, who recommends to continue current therapy  -Due to worsening low back pain, she completed palliative radiation on 07/02/2023 -Lab reviewed, adequate for treatment, will proceed with azacitidine today for 5 days and continue every 6 weeks    The patient understands the plans discussed today and is in agreement with them.  She knows to contact our office if she develops concerns prior to her next appointment.  I provided *** minutes of face-to-face time during this encounter and > 50% was spent counseling as documented under my assessment and plan.    Carlean Jews, NP  [place] [facility] [address] [place] [address] Dept: [phone] Dept Fax: [phone]   No orders of the defined types were placed in this encounter.     CHIEF COMPLAINT:  CC: ***  Current Treatment:  ***  INTERVAL HISTORY:  [name] is here today for repeat clinical assessment. She denies fevers or chills. She denies pain. Her appetite is good. Her weight {Weight change:10426}.  I have reviewed the past medical history, past surgical history, social history and family history with the patient and they are unchanged from previous note.  ALLERGIES:  is allergic to digoxin and related, gluten meal, penicillins, fexofenadine, alendronate, and hydroxychloroquine.  MEDICATIONS:  Current Outpatient Medications  Medication Sig Dispense Refill   acetaminophen-codeine (TYLENOL #3) 300-30 MG tablet Take 1 tablet by mouth every 6 (six) hours as needed for moderate pain. 20 tablet 0   apixaban (ELIQUIS) 5 MG TABS tablet Take 1 tablet (5 mg total) by mouth 2 (two) times daily. 60 tablet 11   denosumab (PROLIA) 60 MG/ML SOSY injection Inject 60 mg into the skin every 6 (six) months.     diltiazem (CARDIZEM CD) 360 MG 24 hr capsule Take 1 capsule (360 mg total) by mouth daily. 90 capsule 3   furosemide (LASIX) 20 MG tablet Take 1 tablet (20 mg total) by mouth daily. 30 tablet 11   gabapentin (NEURONTIN) 300 MG capsule Take 300 mg by mouth in the morning.     Metoprolol Tartrate 75 MG TABS Take 2 tablets (150 mg total) by mouth 2 (two) times daily. 360 tablet 2   pantoprazole (PROTONIX) 20 MG tablet Take 1 tablet (20 mg total) by mouth daily. For three weeks (Patient  not taking: Reported on 09/28/2023) 21 tablet 0   potassium chloride SA (KLOR-CON M20) 20 MEQ tablet Take 1 tablet (20 mEq total) by mouth daily. Take with furosemide 90 tablet 3   predniSONE (DELTASONE) 5 MG tablet Take 5 mg by mouth daily with breakfast.     Tiotropium Bromide-Olodaterol (STIOLTO RESPIMAT) 2.5-2.5 MCG/ACT AERS Inhale 2 puffs into the  lungs daily. 4 g 11   No current facility-administered medications for this visit.   Facility-Administered Medications Ordered in Other Visits  Medication Dose Route Frequency Provider Last Rate Last Admin   acetaminophen (TYLENOL) 325 MG tablet            diphenhydrAMINE (BENADRYL) 25 mg capsule             HISTORY OF PRESENT ILLNESS:   Oncology History Overview Note   Cancer Staging  CMML (chronic myelomonocytic leukemia) (HCC) Staging form: Chronic Myeloid Leukemia, AJCC 8th Edition - Clinical stage from 01/12/2022: Bone marrow blast count (%): 12, Additional clonal changes: Unknown - Signed by Malachy Mood, MD on 01/12/2022 Stage prefix: Initial diagnosis     CMML (chronic myelomonocytic leukemia) (HCC)  11/09/2021 Initial Biopsy   DIAGNOSIS:   -  Monoclonal B-cell population with co-expression of CD5 comprises 17%  of all lymphocytes  -  See comment   COMMENT:  In addition to the clonal B-cell population, there is a myeloblast  population (CD34, CD38, HLA-DR, CD117, CD123 and CD33) that comprises 2% of the total cellular events.  Please see concurrent tissue biopsy (below) for additional work-up and final diagnosis.    FINAL MICROSCOPIC DIAGNOSIS:   A. SOFT TISSUE MASS, PRE SACRAL, NEEDLE CORE BIOPSY:  -  Chronic lymphocytic leukemia/small lymphocytic lymphoma  -  Extra medullary hematopoiesis  -  See comment   COMMENT:  The biopsy consists of multiple soft tissue cores with lymphoid nodules and a dense hematopoietic infiltrate consistent with extra medullary hematopoiesis.  MPO and E-cadherin highlight myeloid and erythroid precursors respectively.  CD34 highlights increased vasculature and is also positive within the cytoplasm of megakaryocytes.  A few small, immature mononuclear cells appear to be positive for CD34 and CD117. TdT shows rare, scattered positive cells.  CD20 highlights aggregates of B cells which are admixed with CD3 positive T cells.  T cells are an  admixture of CD4 and CD8.  The B cells are also positive for CD5, CD23 and Bcl-2.  The B cells do not show significant staining for CD10, BCL6 or cyclin D1.  CD138 highlights scattered plasma cells which are polytypic by kappa and lambda in situ hybridization.  Flow cytometry performed on the sample (see WL [id number]) identified a kappa restricted CD5 positive B-cell population comprising 70% of lymphocytes.  In addition, a small myeloblast population comprised 2% of the total cellular events.   Overall, the findings are consistent with soft tissue involvement by  chronic lymphocytic leukemia/small lymphocytic lymphoma and extra medullary hematopoiesis. In reviewing the patient's CBC data (macrocytic anemia and thrombocytopenia), I would recommend a bone marrow biopsy to assess for marrow involvement by CLL/SLL.    12/23/2021 Imaging   EXAM: CT CHEST, ABDOMEN, AND PELVIS WITH CONTRAST  IMPRESSION: 1. Slight interval enlargement of a presacral soft tissue mass measuring 7.2 x 4.7 cm, previously 6.9 x 4.1 cm on prior MR of the pelvis dated 07/04/2021. By report, this represents a biopsy proven lymphoma. 2. Pleural nodule of the dependent right lower lobe overlying the posterior right tenth rib and pleural or paraspinous soft  tissue mass overlying the right aspect of the T10 vertebral body, very slightly enlarged compared to prior examination of the chest dated 06/25/2020, consistent with additional sites of lymphomatous involvement given very indolent growth. These could be better assessed for metabolic activity by FDG PET/CT if desired. 3. There is mild, bibasilar predominant pulmonary fibrosis in a pattern featuring irregular peripheral interstitial opacity, septal thickening, but without clear evidence of subpleural bronchiolectasis or honeycombing, with a somewhat asymmetric distribution most conspicuously involving the right lower lobe and lingula. These findings are significantly  worsened when compared to prior examination dated 06/25/2020, particularly in the right lower lobe. Given interval change, this may reflect sequelae of interval infection or aspiration, however appearance is generally suspicious for fibrotic interstitial lung disease, and if characterized by ATS pulmonary fibrosis is in an "indeterminate for UIP" pattern, differential considerations including both UIP and NSIP. 4. Emphysema.   Aortic Atherosclerosis (ICD10-I70.0) and Emphysema (ICD10-J43.9).   01/05/2022 Pathology Results   DIAGNOSIS:   BONE MARROW, ASPIRATE, CLOT, CORE:  -Hypercellular bone marrow for age with features of  myelodysplastic/myeloproliferative neoplasm  -Minor abnormal B-cell population  -See comment   PERIPHERAL BLOOD:  -Macrocytic anemia  -Neutrophilic left shift and monocytosis  -Thrombocytopenia   COMMENT:  The bone marrow is hypercellular for age with dyspoietic changes  involving myeloid cell lines associated with monocytosis and increased number of blastic cells (12%) as primarily seen by morphology, many of which display monocytic features.  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  In this background, there are several predominantly small lymphoid aggregates mostly composed of small lymphoid cells.  By flow cytometry, a minor abnormal B-cell population expressing CD5 is seen and representing 2% of all cells.  This correlate with previously known B-cell lymphoproliferative process.  Correlation with cytogenetic and FISH studies is strongly recommended.    DIAGNOSIS:   -Increased number of monocytic cells present (25%)  -Minor abnormal B-cell population identified.  -See comment   COMMENT:  Flow cytometric analysis shows increased number of monocytic cells representing 25% of all cells but  without aberrant phenotype or CD34 expression.  A significant CD34-positive blastic population is not identified.  The lymphoid population shows a minor B-cell population representing 2% of all cells and expressing B-cell antigens including CD20 associated with CD5, CD200 and possibly dim kappa expression.  The latter findings are abnormal and correlate with previously known B-cell lymphoproliferative process.  No significant T-cell phenotypic abnormalities identified.    01/12/2022 Initial Diagnosis   CMML (chronic myelomonocytic leukemia) (HCC)   01/12/2022 Cancer Staging   Staging form: Chronic Myeloid Leukemia, AJCC 8th Edition - Clinical stage from 01/12/2022: Bone marrow blast count (%): 12, Additional clonal changes: Unknown - Signed by Malachy Mood, MD on 01/12/2022 Stage prefix: Initial diagnosis   02/06/2022 - 08/11/2022 Chemotherapy   Patient is on Treatment Plan : MYELODYSPLASIA  Azacitidine IV D1-7 q28d     02/06/2022 -  Chemotherapy   Patient is on Treatment Plan : MYELODYSPLASIA  Azacitidine IV D1-7 q28d     06/03/2023 Imaging   MR lumbar spine with and without contrast IMPRESSION: 1. Long-standing presacral mass, at least doubled in size since 2016, now at least 8 cm in length. There is internal fat components and differential considerations include myelolipoma, liposarcoma, and teratoma. 2. 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 3. L2-3 left paracentral extrusion compressing the left L3  nerve root. 4. Right foraminal impingement at L3-4 to L4-5, greatest at L4-5. Right subarticular recess narrowing at L3-4 and L4-5.   07/09/2023 Imaging   CT abdomen and pelvis with contrast  IMPRESSION: No acute findings in the abdomen or pelvis. Specifically, no findings to explain the patient's history of pain. 2. Interval progression of presacral and right thoracic paraspinal soft tissue lesions compatible with mild progression of disease since 06/19/2022.  Small posterior right thoracic chest wall lesion also mildly progressive. 3. Mild circumferential wall thickening distal esophagus. Esophagitis would be a consideration. 4.  Aortic Atherosclerosis (ICD10-I70.0).       REVIEW OF SYSTEMS:   Constitutional: Denies fevers, chills or abnormal weight loss Eyes: Denies blurriness of vision Ears, nose, mouth, throat, and face: Denies mucositis or sore throat Respiratory: Denies cough, dyspnea or wheezes Cardiovascular: Denies palpitation, chest discomfort or lower extremity swelling Gastrointestinal:  Denies nausea, heartburn or change in bowel habits Skin: Denies abnormal skin rashes Lymphatics: Denies new lymphadenopathy or easy bruising Neurological:Denies numbness, tingling or new weaknesses Behavioral/Psych: Mood is stable, no new changes  All other systems were reviewed with the patient and are negative.   VITALS:  There were no vitals taken for this visit.  Wt Readings from Last 3 Encounters:  09/28/23 106 lb (48.1 kg)  09/03/23 107 lb (48.5 kg)  08/31/23 106 lb 12 oz (48.4 kg)    There is no height or weight on file to calculate BMI.  Performance status (ECOG): {CHL ONC [id number]  PHYSICAL EXAM:   GENERAL:alert, no distress and comfortable SKIN: skin color, texture, turgor are normal, no rashes or significant lesions EYES: normal, Conjunctiva are pink and non-injected, sclera clear OROPHARYNX:no exudate, no erythema and lips, buccal mucosa, and tongue normal  NECK: supple, thyroid normal size, non-tender, without nodularity LYMPH:  no palpable lymphadenopathy in the cervical, axillary or inguinal LUNGS: clear to auscultation and percussion with normal breathing effort HEART: regular rate & rhythm and no murmurs and no lower extremity edema ABDOMEN:abdomen soft, non-tender and normal bowel sounds Musculoskeletal:no cyanosis of digits and no clubbing  NEURO: alert & oriented x 3 with fluent speech, no focal  motor/sensory deficits  LABORATORY DATA:  I have reviewed the data as listed    Component Value Date/Time   NA 139 09/19/2023 1338   NA 141 01/04/2016 1001   K 4.3 09/19/2023 1338   K 4.5 01/04/2016 1001   CL 103 09/19/2023 1338   CO2 28 09/19/2023 1338   CO2 24 01/04/2016 1001   GLUCOSE 107 (H) 09/19/2023 1338   GLUCOSE 105 01/04/2016 1001   BUN 17 09/19/2023 1338   BUN 21.7 01/04/2016 1001   CREATININE 0.97 09/19/2023 1338   CREATININE 1.2 (H) 01/04/2016 1001   CALCIUM 9.8 09/19/2023 1338   CALCIUM 9.7 01/04/2016 1001   PROT 6.4 (L) 09/19/2023 1338   PROT 7.0 01/04/2016 1001   ALBUMIN 4.2 09/19/2023 1338   ALBUMIN 4.0 01/04/2016 1001   AST 18 09/19/2023 1338   AST 26 01/04/2016 1001   ALT 15 09/19/2023 1338   ALT 24 01/04/2016 1001   ALKPHOS 70 09/19/2023 1338   ALKPHOS 61 01/04/2016 1001   BILITOT 0.8 09/19/2023 1338   BILITOT 0.41 01/04/2016 1001   GFRNONAA >60 09/19/2023 1338   GFRAA 56 (L) 01/09/2020 1339    No results found for: "SPEP", "UPEP"  Lab Results  Component Value Date   WBC 5.2 09/19/2023   NEUTROABS 2.5 09/19/2023   HGB 9.8 (L) 09/19/2023  HCT 30.6 (L) 09/19/2023   MCV 107.4 (H) 09/19/2023   PLT 75 (L) 09/19/2023      Chemistry      Component Value Date/Time   NA 139 09/19/2023 1338   NA 141 01/04/2016 1001   K 4.3 09/19/2023 1338   K 4.5 01/04/2016 1001   CL 103 09/19/2023 1338   CO2 28 09/19/2023 1338   CO2 24 01/04/2016 1001   BUN 17 09/19/2023 1338   BUN 21.7 01/04/2016 1001   CREATININE 0.97 09/19/2023 1338   CREATININE 1.2 (H) 01/04/2016 1001      Component Value Date/Time   CALCIUM 9.8 09/19/2023 1338   CALCIUM 9.7 01/04/2016 1001   ALKPHOS 70 09/19/2023 1338   ALKPHOS 61 01/04/2016 1001   AST 18 09/19/2023 1338   AST 26 01/04/2016 1001   ALT 15 09/19/2023 1338   ALT 24 01/04/2016 1001   BILITOT 0.8 09/19/2023 1338   BILITOT 0.41 01/04/2016 1001       RADIOGRAPHIC STUDIES: I have personally reviewed the  radiological images as listed and agreed with the findings in the report. No results found.

## 2023-10-03 NOTE — Assessment & Plan Note (Signed)
diagnosed in 10/2021 ---She developed worsening back pain. Pelvic MRI 07/04/21 showed a mass in the presacral space. 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 Her BM biopsy was reviewed at [facility] and was felt to be CMML-1 with 5% blasts.  -She began azacitadine on 02/06/22. she has had multiple hospital admissions and her performance status has been very low.  Changed her chemo to every 6 weeks in 08/2022 due to her fatigue. -she is tolerating every 6 weeks much better, feels better and able to do all her ADL now -she saw Dr. Sharyne Richters in Feb 2024, who recommends to continue current therapy  -Due to worsening low back pain, she completed palliative radiation on 07/02/2023 -Lab reviewed, adequate for treatment, will proceed with azacitidine today for 5 days and continue every 6 weeks

## 2023-10-04 ENCOUNTER — Inpatient Hospital Stay (HOSPITAL_BASED_OUTPATIENT_CLINIC_OR_DEPARTMENT_OTHER): Payer: Medicare Other | Admitting: Hematology

## 2023-10-04 ENCOUNTER — Inpatient Hospital Stay: Payer: Medicare Other | Attending: Nurse Practitioner

## 2023-10-04 ENCOUNTER — Encounter: Payer: Self-pay | Admitting: Hematology

## 2023-10-04 ENCOUNTER — Ambulatory Visit (HOSPITAL_COMMUNITY)
Admission: RE | Admit: 2023-10-04 | Discharge: 2023-10-04 | Disposition: A | Discharge status: Home / Self Care | Payer: Medicare Other | Source: Ambulatory Visit | Attending: Hematology | Admitting: Hematology

## 2023-10-04 VITALS — BP 98/66 | HR 103 | Temp 97.7°F | Resp 18

## 2023-10-04 DIAGNOSIS — Z95828 Presence of other vascular implants and grafts: Secondary | ICD-10-CM

## 2023-10-04 DIAGNOSIS — M5136 Other intervertebral disc degeneration, lumbar region with discogenic back pain only: Secondary | ICD-10-CM | POA: Diagnosis not present

## 2023-10-04 DIAGNOSIS — C9310 Chronic myelomonocytic leukemia not having achieved remission: Secondary | ICD-10-CM

## 2023-10-04 DIAGNOSIS — I878 Other specified disorders of veins: Secondary | ICD-10-CM | POA: Diagnosis not present

## 2023-10-04 DIAGNOSIS — Z5111 Encounter for antineoplastic chemotherapy: Secondary | ICD-10-CM | POA: Diagnosis not present

## 2023-10-04 DIAGNOSIS — M4185 Other forms of scoliosis, thoracolumbar region: Secondary | ICD-10-CM | POA: Diagnosis not present

## 2023-10-04 DIAGNOSIS — Z79899 Other long term (current) drug therapy: Secondary | ICD-10-CM | POA: Insufficient documentation

## 2023-10-04 DIAGNOSIS — I700 Atherosclerosis of aorta: Secondary | ICD-10-CM | POA: Diagnosis not present

## 2023-10-04 LAB — CMP (CANCER CENTER ONLY)
ALT: 19 U/L (ref 0–44)
AST: 18 U/L (ref 15–41)
Albumin: 4.1 g/dL (ref 3.5–5.0)
Alkaline Phosphatase: 63 U/L (ref 38–126)
Anion gap: 7 (ref 5–15)
BUN: 24 mg/dL — ABNORMAL HIGH (ref 8–23)
CO2: 27 mmol/L (ref 22–32)
Calcium: 9.9 mg/dL (ref 8.9–10.3)
Chloride: 102 mmol/L (ref 98–111)
Creatinine: 0.89 mg/dL (ref 0.44–1.00)
GFR, Estimated: >60 mL/min (ref >60)
Glucose, Bld: 124 mg/dL — ABNORMAL HIGH (ref 70–99)
Potassium: 4.2 mmol/L (ref 3.5–5.1)
Sodium: 136 mmol/L (ref 135–145)
Total Bilirubin: 0.8 mg/dL (ref 0.3–1.2)
Total Protein: 6.5 g/dL (ref 6.5–8.1)

## 2023-10-04 LAB — CBC WITH DIFFERENTIAL (CANCER CENTER ONLY)
Abs Immature Granulocytes: 0.55 10*3/uL — ABNORMAL HIGH (ref 0.00–0.07)
Basophils Absolute: 0.1 10*3/uL (ref 0.0–0.1)
Basophils Relative: 1 %
Eosinophils Absolute: 0.4 10*3/uL (ref 0.0–0.5)
Eosinophils Relative: 2 %
HCT: 33.6 % — ABNORMAL LOW (ref 36.0–46.0)
Hemoglobin: 10.7 g/dL — ABNORMAL LOW (ref 12.0–15.0)
Immature Granulocytes: 4 %
Lymphocytes Relative: 7 %
Lymphs Abs: 1.1 10*3/uL (ref 0.7–4.0)
MCH: 34.1 pg — ABNORMAL HIGH (ref 26.0–34.0)
MCHC: 31.8 g/dL (ref 30.0–36.0)
MCV: 107 fL — ABNORMAL HIGH (ref 80.0–100.0)
Monocytes Absolute: 3.8 10*3/uL — ABNORMAL HIGH (ref 0.1–1.0)
Monocytes Relative: 25 %
Neutro Abs: 9.3 10*3/uL — ABNORMAL HIGH (ref 1.7–7.7)
Neutrophils Relative %: 61 %
Platelet Count: 168 10*3/uL (ref 150–400)
RBC: 3.14 MIL/uL — ABNORMAL LOW (ref 3.87–5.11)
RDW: 14.7 % (ref 11.5–15.5)
Smear Review: NORMAL
WBC Count: 15.2 10*3/uL — ABNORMAL HIGH (ref 4.0–10.5)
nRBC: 0 % (ref 0.0–0.2)

## 2023-10-04 MED ORDER — TRAMADOL HCL 50 MG PO TABS
50.0000 mg | ORAL_TABLET | Freq: Four times a day (QID) | ORAL | 0 refills | Status: DC | PRN
Start: 2023-10-04 — End: 2023-11-12

## 2023-10-04 MED ORDER — HEPARIN SOD (PORK) LOCK FLUSH 100 UNIT/ML IV SOLN
500.0000 [IU] | Freq: Once | INTRAVENOUS | Status: AC
  Administered 2023-10-04: 500 [IU]

## 2023-10-04 MED ORDER — SODIUM CHLORIDE 0.9% FLUSH
10.0000 mL | Freq: Once | INTRAVENOUS | Status: AC
  Administered 2023-10-04: 10 mL

## 2023-10-08 ENCOUNTER — Inpatient Hospital Stay: Payer: Medicare Other

## 2023-10-08 ENCOUNTER — Inpatient Hospital Stay (HOSPITAL_BASED_OUTPATIENT_CLINIC_OR_DEPARTMENT_OTHER): Payer: Medicare Other | Admitting: Hematology

## 2023-10-08 ENCOUNTER — Encounter: Payer: Self-pay | Admitting: Hematology

## 2023-10-08 VITALS — BP 116/55 | HR 72 | Temp 98.0°F | Resp 16

## 2023-10-08 DIAGNOSIS — C9310 Chronic myelomonocytic leukemia not having achieved remission: Secondary | ICD-10-CM | POA: Diagnosis not present

## 2023-10-08 DIAGNOSIS — Z79899 Other long term (current) drug therapy: Secondary | ICD-10-CM | POA: Diagnosis not present

## 2023-10-08 DIAGNOSIS — Z95828 Presence of other vascular implants and grafts: Secondary | ICD-10-CM

## 2023-10-08 DIAGNOSIS — Z5111 Encounter for antineoplastic chemotherapy: Secondary | ICD-10-CM | POA: Diagnosis not present

## 2023-10-08 LAB — CBC WITH DIFFERENTIAL (CANCER CENTER ONLY)
Abs Immature Granulocytes: 0.22 10*3/uL — ABNORMAL HIGH (ref 0.00–0.07)
Basophils Absolute: 0.1 10*3/uL (ref 0.0–0.1)
Basophils Relative: 1 %
Eosinophils Absolute: 0.7 10*3/uL — ABNORMAL HIGH (ref 0.0–0.5)
Eosinophils Relative: 4 %
HCT: 29.3 % — ABNORMAL LOW (ref 36.0–46.0)
Hemoglobin: 9.4 g/dL — ABNORMAL LOW (ref 12.0–15.0)
Immature Granulocytes: 1 %
Lymphocytes Relative: 11 %
Lymphs Abs: 1.8 10*3/uL (ref 0.7–4.0)
MCH: 34.3 pg — ABNORMAL HIGH (ref 26.0–34.0)
MCHC: 32.1 g/dL (ref 30.0–36.0)
MCV: 106.9 fL — ABNORMAL HIGH (ref 80.0–100.0)
Monocytes Absolute: 5.3 10*3/uL — ABNORMAL HIGH (ref 0.1–1.0)
Monocytes Relative: 33 %
Neutro Abs: 7.7 10*3/uL (ref 1.7–7.7)
Neutrophils Relative %: 50 %
Platelet Count: 186 10*3/uL (ref 150–400)
RBC: 2.74 MIL/uL — ABNORMAL LOW (ref 3.87–5.11)
RDW: 14.6 % (ref 11.5–15.5)
WBC Count: 15.7 10*3/uL — ABNORMAL HIGH (ref 4.0–10.5)
nRBC: 0 % (ref 0.0–0.2)

## 2023-10-08 LAB — BASIC METABOLIC PANEL - CANCER CENTER ONLY
Anion gap: 7 (ref 5–15)
BUN: 23 mg/dL (ref 8–23)
CO2: 27 mmol/L (ref 22–32)
Calcium: 9.6 mg/dL (ref 8.9–10.3)
Chloride: 103 mmol/L (ref 98–111)
Creatinine: 1.05 mg/dL — ABNORMAL HIGH (ref 0.44–1.00)
GFR, Estimated: 55 mL/min — ABNORMAL LOW (ref >60)
Glucose, Bld: 90 mg/dL (ref 70–99)
Potassium: 4 mmol/L (ref 3.5–5.1)
Sodium: 137 mmol/L (ref 135–145)

## 2023-10-08 MED ORDER — HEPARIN SOD (PORK) LOCK FLUSH 100 UNIT/ML IV SOLN
500.0000 [IU] | Freq: Once | INTRAVENOUS | Status: AC | PRN
  Administered 2023-10-08: 500 [IU]

## 2023-10-08 MED ORDER — SODIUM CHLORIDE 0.9% FLUSH
10.0000 mL | Freq: Once | INTRAVENOUS | Status: AC
  Administered 2023-10-08: 10 mL

## 2023-10-08 MED ORDER — SODIUM CHLORIDE 0.9 % IV SOLN: Freq: Once | INTRAVENOUS | Status: AC

## 2023-10-08 MED ORDER — SODIUM CHLORIDE 0.9% FLUSH
10.0000 mL | INTRAVENOUS | Status: DC | PRN
  Administered 2023-10-08: 10 mL

## 2023-10-08 MED ORDER — SODIUM CHLORIDE 0.9 % IV SOLN
75.0000 mg/m2 | Freq: Once | INTRAVENOUS | Status: AC
  Administered 2023-10-08: 114 mg via INTRAVENOUS
  Filled 2023-10-08: qty 11.4

## 2023-10-08 MED ORDER — OXYCODONE HCL 5 MG PO TABS
2.5000 mg | ORAL_TABLET | ORAL | 0 refills | Status: DC | PRN
Start: 2023-10-08 — End: 2023-11-12

## 2023-10-08 MED ORDER — ONDANSETRON HCL 4 MG/2ML IJ SOLN
8.0000 mg | Freq: Once | INTRAMUSCULAR | Status: AC
  Administered 2023-10-08: 8 mg via INTRAVENOUS
  Filled 2023-10-08: qty 4

## 2023-10-08 NOTE — Progress Notes (Signed)
[facility]   Telephone:(336) [phone] Fax:(336) [phone]   Clinic Follow up Note   Patient Care Team: [name], MD as PCP - General (Internal Medicine) [name], Deloris Ping, MD as PCP - Cardiology (Cardiology) Zenovia Jordan, MD as Consulting Physician (Rheumatology) Malachy Mood, MD as Consulting Physician (Hematology) Charna Elizabeth, MD as Consulting Physician (Gastroenterology) Josephine Igo, DO as Consulting Physician (Pulmonary Disease)  Date of Service:  10/08/2023  CHIEF COMPLAINT: f/u of CMML  CURRENT THERAPY:  Azacitidine day 1-5 every 6 weeks  Oncology History   CMML (chronic myelomonocytic leukemia) (HCC) diagnosed in 10/2021 ---She developed worsening back pain, pelvic MRI 07/04/21 showed a mass in the presacral space. 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 Her BM biopsy was reviewed at [facility] and was felt to be CMML-1 with 5% blasts.  -She began azacitadine on 02/06/22. she has had multiple hospital admissions and her performance status has been very low.  Changed her chemo to every 6 weeks in 08/2022 due to her fatigue. -she is tolerating every 6 weeks much better, feels better and able to do all her ADL now -she saw Dr. Sharyne Richters in Feb 2024, who recommends to continue current therapy  -Due to worsening low back pain, she completed palliative radiation on 07/02/2023 -Plan to continue azacitidine every 6 weeks    Assessment and Plan    Chronic Myelomonocytic Leukemia (CMML) Stable blood counts, no fever or other signs of infection. White count elevated, likely reactive. -Continue current treatment regimen. -She is due for treatment today, will proceed with treatment today and for the rest of this week  Lower Back and Knee Pain Pain level reduced from 10 to 5-7 with Tramadol.  Pain is worse with movement and walking. Pain has moved from lower back to knees. MRI has been authorized to investigate cause of pain. -Replace Tramadol with Oxycodone 5mg , starting with 2.5mg  to assess efficacy and tolerance. -Advise patient to have Miralax or [place] at home for potential constipation due to Oxycodone. -Schedule MRI and expedite reading of results.  General Health Maintenance -Encourage patient to stay hydrated and manage constipation with over-the-counter remedies. -Follow-up appointment to be scheduled after MRI results are available.     Plan -Lab reviewed, adequate for treatment, will proceed azacitidine today and continue daily for next 4 days -I contacted the radiology department, and scheduled her MRI for this Friday, October 18, will request a stat reading -Will call her next Monday    SUMMARY OF ONCOLOGIC HISTORY: Oncology History Overview Note   Cancer Staging  CMML (chronic myelomonocytic leukemia) (HCC) Staging form: Chronic Myeloid Leukemia, AJCC 8th Edition - Clinical stage from 01/12/2022: Bone marrow blast count (%): 12, Additional clonal changes: Unknown - Signed by Malachy Mood, MD on 01/12/2022 Stage prefix: Initial diagnosis     CMML (chronic myelomonocytic leukemia) (HCC)  11/09/2021 Initial Biopsy   DIAGNOSIS:   -  Monoclonal B-cell population with co-expression of CD5 comprises 17%  of all lymphocytes  -  See comment   COMMENT:  In addition to the clonal B-cell population, there is a myeloblast  population (CD34, CD38, HLA-DR, CD117, CD123 and CD33) that comprises 2% of the total cellular events.  Please see concurrent tissue biopsy (below) for additional work-up and final diagnosis.    FINAL MICROSCOPIC DIAGNOSIS:   A. SOFT TISSUE MASS, PRE SACRAL, NEEDLE CORE BIOPSY:  -  Chronic lymphocytic leukemia/small lymphocytic lymphoma  -  Extra medullary hematopoiesis  -  See comment   COMMENT:  The biopsy consists of multiple soft tissue  cores with lymphoid nodules and a dense hematopoietic infiltrate consistent with extra medullary hematopoiesis.  MPO and E-cadherin highlight myeloid and erythroid precursors respectively.  CD34 highlights increased vasculature and is also positive within the cytoplasm of megakaryocytes.  A few small, immature mononuclear cells appear to be positive for CD34 and CD117. TdT shows rare, scattered positive cells.  CD20 highlights aggregates of B cells which are admixed with CD3 positive T cells.  T cells are an admixture of CD4 and CD8.  The B cells are also positive for CD5, CD23 and Bcl-2.  The B cells do not show significant staining for CD10, BCL6 or cyclin D1.  CD138 highlights scattered plasma cells which are polytypic by kappa and lambda in situ hybridization.  Flow cytometry performed on the sample (see WL [id number]) identified a kappa restricted CD5 positive B-cell population comprising 70% of lymphocytes.  In addition, a small myeloblast population comprised 2% of the total cellular events.   Overall, the findings are consistent with soft tissue involvement by  chronic lymphocytic leukemia/small lymphocytic lymphoma and extra medullary hematopoiesis. In reviewing the patient's CBC data (macrocytic anemia and thrombocytopenia), I would recommend a bone marrow biopsy to assess for marrow involvement by CLL/SLL.    12/23/2021 Imaging   EXAM: CT CHEST, ABDOMEN, AND PELVIS WITH CONTRAST  IMPRESSION: 1. Slight interval enlargement of a presacral soft tissue mass measuring 7.2 x 4.7 cm, previously 6.9 x 4.1 cm on prior MR of the pelvis dated 07/04/2021. By report, this represents a biopsy proven lymphoma. 2. Pleural nodule of the dependent right lower lobe overlying the posterior right tenth rib and pleural or paraspinous soft tissue mass overlying the right aspect of the T10 vertebral body, very slightly enlarged compared to prior examination of the chest dated 06/25/2020, consistent with  additional sites of lymphomatous involvement given very indolent growth. These could be better assessed for metabolic activity by FDG PET/CT if desired. 3. There is mild, bibasilar predominant pulmonary fibrosis in a pattern featuring irregular peripheral interstitial opacity, septal thickening, but without clear evidence of subpleural bronchiolectasis or honeycombing, with a somewhat asymmetric distribution most conspicuously involving the right lower lobe and lingula. These findings are significantly worsened when compared to prior examination dated 06/25/2020, particularly in the right lower lobe. Given interval change, this may reflect sequelae of interval infection or aspiration, however appearance is generally suspicious for fibrotic interstitial lung disease, and if characterized by ATS pulmonary fibrosis is in an "indeterminate for UIP" pattern, differential considerations including both UIP and NSIP. 4. Emphysema.   Aortic Atherosclerosis (ICD10-I70.0) and Emphysema (ICD10-J43.9).   01/05/2022 Pathology Results   DIAGNOSIS:   BONE MARROW, ASPIRATE, CLOT, CORE:  -Hypercellular bone marrow for age with features of  myelodysplastic/myeloproliferative neoplasm  -Minor abnormal B-cell population  -See comment   PERIPHERAL BLOOD:  -Macrocytic anemia  -Neutrophilic left shift and monocytosis  -Thrombocytopenia   COMMENT:  The bone marrow is hypercellular for age with dyspoietic changes  involving myeloid cell lines associated with monocytosis and increased number of blastic cells (12%) as primarily seen by morphology, many of which display monocytic features.  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  In this background, there are several predominantly small lymphoid aggregates mostly composed of small  lymphoid  cells.  By flow cytometry, a minor abnormal B-cell population expressing CD5 is seen and representing 2% of all cells.  This correlate with previously known B-cell lymphoproliferative process.  Correlation with cytogenetic and FISH studies is strongly recommended.    DIAGNOSIS:   -Increased number of monocytic cells present (25%)  -Minor abnormal B-cell population identified.  -See comment   COMMENT:  Flow cytometric analysis shows increased number of monocytic cells representing 25% of all cells but without aberrant phenotype or CD34 expression.  A significant CD34-positive blastic population is not identified.  The lymphoid population shows a minor B-cell population representing 2% of all cells and expressing B-cell antigens including CD20 associated with CD5, CD200 and possibly dim kappa expression.  The latter findings are abnormal and correlate with previously known B-cell lymphoproliferative process.  No significant T-cell phenotypic abnormalities identified.    01/12/2022 Initial Diagnosis   CMML (chronic myelomonocytic leukemia) (HCC)   01/12/2022 Cancer Staging   Staging form: Chronic Myeloid Leukemia, AJCC 8th Edition - Clinical stage from 01/12/2022: Bone marrow blast count (%): 12, Additional clonal changes: Unknown - Signed by Malachy Mood, MD on 01/12/2022 Stage prefix: Initial diagnosis   02/06/2022 - 08/11/2022 Chemotherapy   Patient is on Treatment Plan : MYELODYSPLASIA  Azacitidine IV D1-7 q28d     02/06/2022 -  Chemotherapy   Patient is on Treatment Plan : MYELODYSPLASIA  Azacitidine IV D1-7 q28d     06/03/2023 Imaging   MR lumbar spine with and without contrast IMPRESSION: 1. Long-standing presacral mass, at least doubled in size since 2016, now at least 8 cm in length. There is internal fat components and differential considerations include myelolipoma, liposarcoma, and teratoma. 2. 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 3. L2-3 left  paracentral extrusion compressing the left L3 nerve root. 4. Right foraminal impingement at L3-4 to L4-5, greatest at L4-5. Right subarticular recess narrowing at L3-4 and L4-5.   07/09/2023 Imaging   CT abdomen and pelvis with contrast  IMPRESSION: No acute findings in the abdomen or pelvis. Specifically, no findings to explain the patient's history of pain. 2. Interval progression of presacral and right thoracic paraspinal soft tissue lesions compatible with mild progression of disease since 06/19/2022. Small posterior right thoracic chest wall lesion also mildly progressive. 3. Mild circumferential wall thickening distal esophagus. Esophagitis would be a consideration. 4.  Aortic Atherosclerosis (ICD10-I70.0).      Discussed the use of AI scribe software for clinical note transcription with the patient, who gave verbal consent to proceed.  History of Present Illness   A 77 year old patient with a history of Chronic Myelomonocytic Leukemia (CMML) presents with persistent pain despite current medication management. The pain is primarily located in the lower back and knees, and has led to the patient's increased use of a wheelchair for mobility. The patient reports that the pain medication, Tramadol, reduces the pain level from a 10 to a 5 or 7, particularly when attempting to walk. However, even while at rest, the patient experiences aching in the lower back and knees. The patient expresses concern that the pain may be due to another tumor and is scheduled for an MRI to investigate the cause. The patient also reports low energy, which she attributes to excessive sitting due to the pain.         All other systems were reviewed with the patient and are negative.  MEDICAL HISTORY:  Past Medical History:  Diagnosis Date   Arthritis  Rheumatoid arthritis   Celiac disease    Chronic kidney disease    stage 3 from MD notes   COPD (chronic obstructive pulmonary disease) (HCC)    Dyspnea     with going up stairs   Family history of adverse reaction to anesthesia    father had hard time waking up   Headache    sinus headaches   Hot flashes    Hypertension    Iron deficiency anemia    Pneumonia    per patient "I have walking pneumonia"    SURGICAL HISTORY: Past Surgical History:  Procedure Laterality Date   COLONOSCOPY     ECTOPIC PREGNANCY SURGERY      x 2   IR IMAGING GUIDED PORT INSERTION  01/31/2022   REVERSE SHOULDER ARTHROPLASTY Right 02/02/2017   Procedure: RIGHT REVERSE SHOULDER ARTHROPLASTY;  Surgeon: Beverely Low, MD;  Location: [facility] OR;  Service: Orthopedics;  Laterality: Right;    I have reviewed the social history and family history with the patient and they are unchanged from previous note.  ALLERGIES:  is allergic to digoxin and related, gluten meal, penicillins, fexofenadine, alendronate, and hydroxychloroquine.  MEDICATIONS:  Current Outpatient Medications  Medication Sig Dispense Refill   oxyCODONE (ROXICODONE) 5 MG immediate release tablet Take 0.5-1 tablets (2.5-5 mg total) by mouth every 4 (four) hours as needed for severe pain (pain score 7-10). 30 tablet 0   acetaminophen-codeine (TYLENOL #3) 300-30 MG tablet Take 1 tablet by mouth every 6 (six) hours as needed for moderate pain. 20 tablet 0   apixaban (ELIQUIS) 5 MG TABS tablet Take 1 tablet (5 mg total) by mouth 2 (two) times daily. 60 tablet 11   denosumab (PROLIA) 60 MG/ML SOSY injection Inject 60 mg into the skin every 6 (six) months.     diltiazem (CARDIZEM CD) 360 MG 24 hr capsule Take 1 capsule (360 mg total) by mouth daily. 90 capsule 3   furosemide (LASIX) 20 MG tablet Take 1 tablet (20 mg total) by mouth daily. 30 tablet 11   gabapentin (NEURONTIN) 300 MG capsule Take 300 mg by mouth in the morning.     Metoprolol Tartrate 75 MG TABS Take 2 tablets (150 mg total) by mouth 2 (two) times daily. 360 tablet 2   pantoprazole (PROTONIX) 20 MG tablet Take 1 tablet (20 mg total) by mouth daily.  For three weeks (Patient not taking: Reported on 09/28/2023) 21 tablet 0   potassium chloride SA (KLOR-CON M20) 20 MEQ tablet Take 1 tablet (20 mEq total) by mouth daily. Take with furosemide 90 tablet 3   predniSONE (DELTASONE) 5 MG tablet Take 5 mg by mouth daily with breakfast.     Tiotropium Bromide-Olodaterol (STIOLTO RESPIMAT) 2.5-2.5 MCG/ACT AERS Inhale 2 puffs into the lungs daily. 4 g 11   traMADol (ULTRAM) 50 MG tablet Take 1-2 tablets (50-100 mg total) by mouth every 6 (six) hours as needed. 15 tablet 0   No current facility-administered medications for this visit.   Facility-Administered Medications Ordered in Other Visits  Medication Dose Route Frequency Provider Last Rate Last Admin   acetaminophen (TYLENOL) 325 MG tablet            diphenhydrAMINE (BENADRYL) 25 mg capsule            sodium chloride flush (NS) 0.9 % injection 10 mL  10 mL Intracatheter PRN Malachy Mood, MD   10 mL at 10/08/23 1630    PHYSICAL EXAMINATION: ECOG PERFORMANCE STATUS: 3 - Symptomatic, >50%  confined to bed  Vitals:   10/08/23 1411  BP: (!) 116/55  Pulse: 72  Resp: 16  Temp: 98 F (36.7 C)  SpO2: 99%   Wt Readings from Last 3 Encounters:  09/28/23 106 lb (48.1 kg)  09/03/23 107 lb (48.5 kg)  08/31/23 106 lb 12 oz (48.4 kg)     GENERAL:alert, no distress and comfortable SKIN: skin color, texture, turgor are normal, no rashes or significant lesions except a few ecchymosis on her arms EYES: normal, Conjunctiva are pink and non-injected, sclera clear NECK: supple, thyroid normal size, non-tender, without nodularity LYMPH:  no palpable lymphadenopathy in the cervical, axillary  LUNGS: clear to auscultation and percussion with normal breathing effort HEART: regular rate & rhythm and no murmurs and no lower extremity edema ABDOMEN:abdomen soft, non-tender and normal bowel sounds Musculoskeletal:no cyanosis of digits and no clubbing  NEURO: alert & oriented x 3 with fluent speech, no focal  motor/sensory deficits   LABORATORY DATA:  I have reviewed the data as listed    Latest Ref Rng & Units 10/08/2023    1:21 PM 10/04/2023   11:14 AM 09/19/2023    1:38 PM  CBC  WBC 4.0 - 10.5 K/uL 15.7  15.2  5.2   Hemoglobin 12.0 - 15.0 g/dL 9.4  16.1  9.8   Hematocrit 36.0 - 46.0 % 29.3  33.6  30.6   Platelets 150 - 400 K/uL 186  168  75         Latest Ref Rng & Units 10/08/2023    1:21 PM 10/04/2023   11:14 AM 09/19/2023    1:38 PM  CMP  Glucose 70 - 99 mg/dL 90  096  045   BUN 8 - 23 mg/dL 23  24  17    Creatinine 0.44 - 1.00 mg/dL 4.09  8.11  9.14   Sodium 135 - 145 mmol/L 137  136  139   Potassium 3.5 - 5.1 mmol/L 4.0  4.2  4.3   Chloride 98 - 111 mmol/L 103  102  103   CO2 22 - 32 mmol/L 27  27  28    Calcium 8.9 - 10.3 mg/dL 9.6  9.9  9.8   Total Protein 6.5 - 8.1 g/dL  6.5  6.4   Total Bilirubin 0.3 - 1.2 mg/dL  0.8  0.8   Alkaline Phos 38 - 126 U/L  63  70   AST 15 - 41 U/L  18  18   ALT 0 - 44 U/L  19  15       RADIOGRAPHIC STUDIES: I have personally reviewed the radiological images as listed and agreed with the findings in the report. No results found.    No orders of the defined types were placed in this encounter.  All questions were answered. The patient knows to call the clinic with any problems, questions or concerns. No barriers to learning was detected. The total time spent in the appointment was 25 minutes.     Malachy Mood, MD 10/08/2023

## 2023-10-08 NOTE — Patient Instructions (Signed)
[place] [facility]  Discharge Instructions: Thank you for choosing [facility] to provide your oncology and hematology care.   If you have a lab appointment with the Cancer Center, please go directly to the Cancer Center and check in at the registration area.   Wear comfortable clothing and clothing appropriate for easy access to any Portacath or PICC line.   We strive to give you quality time with your provider. You may need to reschedule your appointment if you arrive late (15 or more minutes).  Arriving late affects you and other patients whose appointments are after yours.  Also, if you miss three or more appointments without notifying the office, you may be dismissed from the clinic at the provider's discretion.      For prescription refill requests, have your pharmacy contact our office and allow 72 hours for refills to be completed.    Today you received the following chemotherapy and/or immunotherapy agents vidaza      To help prevent nausea and vomiting after your treatment, we encourage you to take your nausea medication as directed.  BELOW ARE SYMPTOMS THAT SHOULD BE REPORTED IMMEDIATELY: *FEVER GREATER THAN 100.4 F (38 C) OR HIGHER *CHILLS OR SWEATING *NAUSEA AND VOMITING THAT IS NOT CONTROLLED WITH YOUR NAUSEA MEDICATION *UNUSUAL SHORTNESS OF BREATH *UNUSUAL BRUISING OR BLEEDING *URINARY PROBLEMS (pain or burning when urinating, or frequent urination) *BOWEL PROBLEMS (unusual diarrhea, constipation, pain near the anus) TENDERNESS IN MOUTH AND THROAT WITH OR WITHOUT PRESENCE OF ULCERS (sore throat, sores in mouth, or a toothache) UNUSUAL RASH, SWELLING OR PAIN  UNUSUAL VAGINAL DISCHARGE OR ITCHING   Items with * indicate a potential emergency and should be followed up as soon as possible or go to the Emergency Department if any problems should occur.  Please show the CHEMOTHERAPY ALERT CARD or IMMUNOTHERAPY ALERT CARD at check-in  to the Emergency Department and triage nurse.  Should you have questions after your visit or need to cancel or reschedule your appointment, please contact [place] [facility]  Dept: [phone]  and follow the prompts.  Office hours are 8:00 a.m. to 4:30 p.m. Monday - Friday. Please note that voicemails left after 4:00 p.m. may not be returned until the following business day.  We are closed weekends and major holidays. You have access to a nurse at all times for urgent questions. Please call the main number to the clinic Dept: [phone] and follow the prompts.   For any non-urgent questions, you may also contact your provider using MyChart. We now offer e-Visits for anyone 25 and older to request care online for non-urgent symptoms. For details visit [email or web address].   Also download the MyChart app! Go to the app store, search "MyChart", open the app, select [place], and log in with your MyChart username and password.

## 2023-10-08 NOTE — Assessment & Plan Note (Signed)
diagnosed in 10/2021 ---She developed worsening back pain, pelvic MRI 07/04/21 showed a mass in the presacral space. 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 Her BM biopsy was reviewed at [facility] and was felt to be CMML-1 with 5% blasts.  -She began azacitadine on 02/06/22. she has had multiple hospital admissions and her performance status has been very low.  Changed her chemo to every 6 weeks in 08/2022 due to her fatigue. -she is tolerating every 6 weeks much better, feels better and able to do all her ADL now -she saw Dr. Sharyne Richters in Feb 2024, who recommends to continue current therapy  -Due to worsening low back pain, she completed palliative radiation on 07/02/2023 -Lab reviewed, adequate for treatment, will proceed with azacitidine today for 5 days and continue every 6 weeks

## 2023-10-09 ENCOUNTER — Inpatient Hospital Stay: Payer: Medicare Other

## 2023-10-09 VITALS — BP 117/55 | HR 72 | Temp 98.2°F | Resp 18

## 2023-10-09 DIAGNOSIS — Z79899 Other long term (current) drug therapy: Secondary | ICD-10-CM | POA: Diagnosis not present

## 2023-10-09 DIAGNOSIS — C9310 Chronic myelomonocytic leukemia not having achieved remission: Secondary | ICD-10-CM | POA: Diagnosis not present

## 2023-10-09 DIAGNOSIS — Z5111 Encounter for antineoplastic chemotherapy: Secondary | ICD-10-CM | POA: Diagnosis not present

## 2023-10-09 MED ORDER — SODIUM CHLORIDE 0.9 % IV SOLN
75.0000 mg/m2 | Freq: Once | INTRAVENOUS | Status: AC
  Administered 2023-10-09: 114 mg via INTRAVENOUS
  Filled 2023-10-09: qty 11.4

## 2023-10-09 MED ORDER — ONDANSETRON HCL 4 MG/2ML IJ SOLN
8.0000 mg | Freq: Once | INTRAMUSCULAR | Status: AC
  Administered 2023-10-09: 8 mg via INTRAVENOUS
  Filled 2023-10-09: qty 4

## 2023-10-09 MED ORDER — SODIUM CHLORIDE 0.9 % IV SOLN: Freq: Once | INTRAVENOUS | Status: AC

## 2023-10-09 NOTE — Patient Instructions (Signed)
[place] [facility]  Discharge Instructions: Thank you for choosing [facility] to provide your oncology and hematology care.   If you have a lab appointment with the Cancer Center, please go directly to the Cancer Center and check in at the registration area.   Wear comfortable clothing and clothing appropriate for easy access to any Portacath or PICC line.   We strive to give you quality time with your provider. You may need to reschedule your appointment if you arrive late (15 or more minutes).  Arriving late affects you and other patients whose appointments are after yours.  Also, if you miss three or more appointments without notifying the office, you may be dismissed from the clinic at the provider's discretion.      For prescription refill requests, have your pharmacy contact our office and allow 72 hours for refills to be completed.    Today you received the following chemotherapy and/or immunotherapy agents vidaza      To help prevent nausea and vomiting after your treatment, we encourage you to take your nausea medication as directed.  BELOW ARE SYMPTOMS THAT SHOULD BE REPORTED IMMEDIATELY: *FEVER GREATER THAN 100.4 F (38 C) OR HIGHER *CHILLS OR SWEATING *NAUSEA AND VOMITING THAT IS NOT CONTROLLED WITH YOUR NAUSEA MEDICATION *UNUSUAL SHORTNESS OF BREATH *UNUSUAL BRUISING OR BLEEDING *URINARY PROBLEMS (pain or burning when urinating, or frequent urination) *BOWEL PROBLEMS (unusual diarrhea, constipation, pain near the anus) TENDERNESS IN MOUTH AND THROAT WITH OR WITHOUT PRESENCE OF ULCERS (sore throat, sores in mouth, or a toothache) UNUSUAL RASH, SWELLING OR PAIN  UNUSUAL VAGINAL DISCHARGE OR ITCHING   Items with * indicate a potential emergency and should be followed up as soon as possible or go to the Emergency Department if any problems should occur.  Please show the CHEMOTHERAPY ALERT CARD or IMMUNOTHERAPY ALERT CARD at check-in  to the Emergency Department and triage nurse.  Should you have questions after your visit or need to cancel or reschedule your appointment, please contact [place] [facility]  Dept: [phone]  and follow the prompts.  Office hours are 8:00 a.m. to 4:30 p.m. Monday - Friday. Please note that voicemails left after 4:00 p.m. may not be returned until the following business day.  We are closed weekends and major holidays. You have access to a nurse at all times for urgent questions. Please call the main number to the clinic Dept: [phone] and follow the prompts.   For any non-urgent questions, you may also contact your provider using MyChart. We now offer e-Visits for anyone 66 and older to request care online for non-urgent symptoms. For details visit [email or web address].   Also download the MyChart app! Go to the app store, search "MyChart", open the app, select [place], and log in with your MyChart username and password.

## 2023-10-10 ENCOUNTER — Inpatient Hospital Stay: Payer: Medicare Other

## 2023-10-10 VITALS — BP 113/64 | HR 78 | Temp 98.0°F | Resp 17

## 2023-10-10 DIAGNOSIS — Z5111 Encounter for antineoplastic chemotherapy: Secondary | ICD-10-CM | POA: Diagnosis not present

## 2023-10-10 DIAGNOSIS — C9310 Chronic myelomonocytic leukemia not having achieved remission: Secondary | ICD-10-CM | POA: Diagnosis not present

## 2023-10-10 DIAGNOSIS — Z79899 Other long term (current) drug therapy: Secondary | ICD-10-CM | POA: Diagnosis not present

## 2023-10-10 MED ORDER — HEPARIN SOD (PORK) LOCK FLUSH 100 UNIT/ML IV SOLN
500.0000 [IU] | Freq: Once | INTRAVENOUS | Status: AC | PRN
  Administered 2023-10-10: 500 [IU]

## 2023-10-10 MED ORDER — SODIUM CHLORIDE 0.9 % IV SOLN
75.0000 mg/m2 | Freq: Once | INTRAVENOUS | Status: AC
  Administered 2023-10-10: 114 mg via INTRAVENOUS
  Filled 2023-10-10: qty 11.4

## 2023-10-10 MED ORDER — ONDANSETRON HCL 4 MG/2ML IJ SOLN
8.0000 mg | Freq: Once | INTRAMUSCULAR | Status: AC
  Administered 2023-10-10: 8 mg via INTRAVENOUS
  Filled 2023-10-10: qty 4

## 2023-10-10 MED ORDER — SODIUM CHLORIDE 0.9% FLUSH
10.0000 mL | INTRAVENOUS | Status: DC | PRN
  Administered 2023-10-10: 10 mL

## 2023-10-10 MED ORDER — SODIUM CHLORIDE 0.9 % IV SOLN: Freq: Once | INTRAVENOUS | Status: AC

## 2023-10-10 NOTE — Patient Instructions (Signed)
[place] [facility]  Discharge Instructions: Thank you for choosing [facility] to provide your oncology and hematology care.   If you have a lab appointment with the Cancer Center, please go directly to the Cancer Center and check in at the registration area.   Wear comfortable clothing and clothing appropriate for easy access to any Portacath or PICC line.   We strive to give you quality time with your provider. You may need to reschedule your appointment if you arrive late (15 or more minutes).  Arriving late affects you and other patients whose appointments are after yours.  Also, if you miss three or more appointments without notifying the office, you may be dismissed from the clinic at the provider's discretion.      For prescription refill requests, have your pharmacy contact our office and allow 72 hours for refills to be completed.    Today you received the following chemotherapy and/or immunotherapy agents: Vidaza     To help prevent nausea and vomiting after your treatment, we encourage you to take your nausea medication as directed.  BELOW ARE SYMPTOMS THAT SHOULD BE REPORTED IMMEDIATELY: *FEVER GREATER THAN 100.4 F (38 C) OR HIGHER *CHILLS OR SWEATING *NAUSEA AND VOMITING THAT IS NOT CONTROLLED WITH YOUR NAUSEA MEDICATION *UNUSUAL SHORTNESS OF BREATH *UNUSUAL BRUISING OR BLEEDING *URINARY PROBLEMS (pain or burning when urinating, or frequent urination) *BOWEL PROBLEMS (unusual diarrhea, constipation, pain near the anus) TENDERNESS IN MOUTH AND THROAT WITH OR WITHOUT PRESENCE OF ULCERS (sore throat, sores in mouth, or a toothache) UNUSUAL RASH, SWELLING OR PAIN  UNUSUAL VAGINAL DISCHARGE OR ITCHING   Items with * indicate a potential emergency and should be followed up as soon as possible or go to the Emergency Department if any problems should occur.  Please show the CHEMOTHERAPY ALERT CARD or IMMUNOTHERAPY ALERT CARD at check-in  to the Emergency Department and triage nurse.  Should you have questions after your visit or need to cancel or reschedule your appointment, please contact [place] [facility]  Dept: [phone]  and follow the prompts.  Office hours are 8:00 a.m. to 4:30 p.m. Monday - Friday. Please note that voicemails left after 4:00 p.m. may not be returned until the following business day.  We are closed weekends and major holidays. You have access to a nurse at all times for urgent questions. Please call the main number to the clinic Dept: [phone] and follow the prompts.   For any non-urgent questions, you may also contact your provider using MyChart. We now offer e-Visits for anyone 47 and older to request care online for non-urgent symptoms. For details visit [email or web address].   Also download the MyChart app! Go to the app store, search "MyChart", open the app, select [place], and log in with your MyChart username and password.

## 2023-10-11 ENCOUNTER — Inpatient Hospital Stay: Payer: Medicare Other

## 2023-10-11 VITALS — BP 101/56 | HR 91 | Temp 98.0°F | Resp 16

## 2023-10-11 DIAGNOSIS — C9310 Chronic myelomonocytic leukemia not having achieved remission: Secondary | ICD-10-CM | POA: Diagnosis not present

## 2023-10-11 DIAGNOSIS — Z79899 Other long term (current) drug therapy: Secondary | ICD-10-CM | POA: Diagnosis not present

## 2023-10-11 DIAGNOSIS — Z5111 Encounter for antineoplastic chemotherapy: Secondary | ICD-10-CM | POA: Diagnosis not present

## 2023-10-11 MED ORDER — ONDANSETRON HCL 4 MG/2ML IJ SOLN
8.0000 mg | Freq: Once | INTRAMUSCULAR | Status: AC
  Administered 2023-10-11: 8 mg via INTRAVENOUS
  Filled 2023-10-11: qty 4

## 2023-10-11 MED ORDER — HEPARIN SOD (PORK) LOCK FLUSH 100 UNIT/ML IV SOLN
500.0000 [IU] | Freq: Once | INTRAVENOUS | Status: AC | PRN
  Administered 2023-10-11: 500 [IU]

## 2023-10-11 MED ORDER — SODIUM CHLORIDE 0.9 % IV SOLN: Freq: Once | INTRAVENOUS | Status: AC

## 2023-10-11 MED ORDER — SODIUM CHLORIDE 0.9% FLUSH
10.0000 mL | INTRAVENOUS | Status: DC | PRN
  Administered 2023-10-11: 10 mL

## 2023-10-11 MED ORDER — SODIUM CHLORIDE 0.9 % IV SOLN
75.0000 mg/m2 | Freq: Once | INTRAVENOUS | Status: AC
  Administered 2023-10-11: 114 mg via INTRAVENOUS
  Filled 2023-10-11: qty 11.4

## 2023-10-11 NOTE — Patient Instructions (Signed)
[place] [facility]  Discharge Instructions: Thank you for choosing [facility] to provide your oncology and hematology care.   If you have a lab appointment with the Cancer Center, please go directly to the Cancer Center and check in at the registration area.   Wear comfortable clothing and clothing appropriate for easy access to any Portacath or PICC line.   We strive to give you quality time with your provider. You may need to reschedule your appointment if you arrive late (15 or more minutes).  Arriving late affects you and other patients whose appointments are after yours.  Also, if you miss three or more appointments without notifying the office, you may be dismissed from the clinic at the provider's discretion.      For prescription refill requests, have your pharmacy contact our office and allow 72 hours for refills to be completed.    Today you received the following chemotherapy and/or immunotherapy agents vidaza      To help prevent nausea and vomiting after your treatment, we encourage you to take your nausea medication as directed.  BELOW ARE SYMPTOMS THAT SHOULD BE REPORTED IMMEDIATELY: *FEVER GREATER THAN 100.4 F (38 C) OR HIGHER *CHILLS OR SWEATING *NAUSEA AND VOMITING THAT IS NOT CONTROLLED WITH YOUR NAUSEA MEDICATION *UNUSUAL SHORTNESS OF BREATH *UNUSUAL BRUISING OR BLEEDING *URINARY PROBLEMS (pain or burning when urinating, or frequent urination) *BOWEL PROBLEMS (unusual diarrhea, constipation, pain near the anus) TENDERNESS IN MOUTH AND THROAT WITH OR WITHOUT PRESENCE OF ULCERS (sore throat, sores in mouth, or a toothache) UNUSUAL RASH, SWELLING OR PAIN  UNUSUAL VAGINAL DISCHARGE OR ITCHING   Items with * indicate a potential emergency and should be followed up as soon as possible or go to the Emergency Department if any problems should occur.  Please show the CHEMOTHERAPY ALERT CARD or IMMUNOTHERAPY ALERT CARD at check-in  to the Emergency Department and triage nurse.  Should you have questions after your visit or need to cancel or reschedule your appointment, please contact [place] [facility]  Dept: [phone]  and follow the prompts.  Office hours are 8:00 a.m. to 4:30 p.m. Monday - Friday. Please note that voicemails left after 4:00 p.m. may not be returned until the following business day.  We are closed weekends and major holidays. You have access to a nurse at all times for urgent questions. Please call the main number to the clinic Dept: [phone] and follow the prompts.   For any non-urgent questions, you may also contact your provider using MyChart. We now offer e-Visits for anyone 31 and older to request care online for non-urgent symptoms. For details visit [email or web address].   Also download the MyChart app! Go to the app store, search "MyChart", open the app, select [place], and log in with your MyChart username and password.

## 2023-10-12 ENCOUNTER — Inpatient Hospital Stay: Payer: Medicare Other

## 2023-10-12 ENCOUNTER — Ambulatory Visit (HOSPITAL_COMMUNITY)
Admission: RE | Admit: 2023-10-12 | Discharge: 2023-10-12 | Disposition: A | Discharge status: Home / Self Care | Payer: Medicare Other | Source: Ambulatory Visit | Attending: Hematology | Admitting: Hematology

## 2023-10-12 VITALS — BP 102/61 | HR 82 | Temp 98.2°F | Resp 17

## 2023-10-12 DIAGNOSIS — Z79899 Other long term (current) drug therapy: Secondary | ICD-10-CM | POA: Diagnosis not present

## 2023-10-12 DIAGNOSIS — C9310 Chronic myelomonocytic leukemia not having achieved remission: Secondary | ICD-10-CM | POA: Insufficient documentation

## 2023-10-12 DIAGNOSIS — C9590 Leukemia, unspecified not having achieved remission: Secondary | ICD-10-CM | POA: Diagnosis not present

## 2023-10-12 DIAGNOSIS — R609 Edema, unspecified: Secondary | ICD-10-CM | POA: Diagnosis not present

## 2023-10-12 DIAGNOSIS — M5136 Other intervertebral disc degeneration, lumbar region with discogenic back pain only: Secondary | ICD-10-CM | POA: Diagnosis not present

## 2023-10-12 DIAGNOSIS — K5730 Diverticulosis of large intestine without perforation or abscess without bleeding: Secondary | ICD-10-CM | POA: Diagnosis not present

## 2023-10-12 DIAGNOSIS — Z5111 Encounter for antineoplastic chemotherapy: Secondary | ICD-10-CM | POA: Diagnosis not present

## 2023-10-12 MED ORDER — HEPARIN SOD (PORK) LOCK FLUSH 100 UNIT/ML IV SOLN
500.0000 [IU] | Freq: Once | INTRAVENOUS | Status: AC | PRN
  Administered 2023-10-12: 500 [IU]

## 2023-10-12 MED ORDER — GADOBUTROL 1 MMOL/ML IV SOLN
5.0000 mL | Freq: Once | INTRAVENOUS | Status: AC | PRN
  Administered 2023-10-12: 5 mL via INTRAVENOUS

## 2023-10-12 MED ORDER — SODIUM CHLORIDE 0.9% FLUSH
10.0000 mL | INTRAVENOUS | Status: DC | PRN
  Administered 2023-10-12: 10 mL

## 2023-10-12 MED ORDER — SODIUM CHLORIDE 0.9 % IV SOLN
75.0000 mg/m2 | Freq: Once | INTRAVENOUS | Status: AC
  Administered 2023-10-12: 114 mg via INTRAVENOUS
  Filled 2023-10-12: qty 11.4

## 2023-10-12 MED ORDER — ONDANSETRON HCL 4 MG/2ML IJ SOLN
8.0000 mg | Freq: Once | INTRAMUSCULAR | Status: AC
  Administered 2023-10-12: 8 mg via INTRAVENOUS
  Filled 2023-10-12: qty 4

## 2023-10-12 MED ORDER — SODIUM CHLORIDE 0.9 % IV SOLN: Freq: Once | INTRAVENOUS | Status: AC

## 2023-10-17 NOTE — Assessment & Plan Note (Signed)
diagnosed in 10/2021 ---She developed worsening back pain, pelvic MRI 07/04/21 showed a mass in the presacral space. 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 Her BM biopsy was reviewed at [facility] and was felt to be CMML-1 with 5% blasts.  -She began azacitadine on 02/06/22. she has had multiple hospital admissions and her performance status has been very low.  Changed her chemo to every 6 weeks in 08/2022 due to her fatigue. -she is tolerating every 6 weeks much better, feels better and able to do all her ADL now -she saw Dr. Sharyne Richters in Feb 2024, who recommends to continue current therapy  -Due to worsening low back pain, she completed palliative radiation on 07/02/2023

## 2023-10-18 ENCOUNTER — Inpatient Hospital Stay: Payer: Medicare Other | Admitting: Hematology

## 2023-10-18 VITALS — BP 106/75 | HR 81 | Temp 97.6°F | Resp 18 | Ht 63.0 in | Wt 111.3 lb

## 2023-10-18 DIAGNOSIS — Z79899 Other long term (current) drug therapy: Secondary | ICD-10-CM | POA: Diagnosis not present

## 2023-10-18 DIAGNOSIS — C9310 Chronic myelomonocytic leukemia not having achieved remission: Secondary | ICD-10-CM

## 2023-10-18 DIAGNOSIS — Z5111 Encounter for antineoplastic chemotherapy: Secondary | ICD-10-CM | POA: Diagnosis not present

## 2023-10-18 MED ORDER — OXYCODONE-ACETAMINOPHEN 5-325 MG PO TABS
0.5000 | ORAL_TABLET | ORAL | 0 refills | Status: DC | PRN
Start: 2023-10-18 — End: 2023-11-14

## 2023-10-18 NOTE — Progress Notes (Signed)
[facility]   [phone] [phone]   Clinic Follow up Note   Patient Care Team: [name], MD as PCP - General (Internal Medicine) [name], Deloris Ping, MD as PCP - Cardiology (Cardiology) Zenovia Jordan, MD as Consulting Physician (Rheumatology) Malachy Mood, MD as Consulting Physician (Hematology) Charna Elizabeth, MD as Consulting Physician (Gastroenterology) Josephine Igo, DO as Consulting Physician (Pulmonary Disease)  Date of Service:  10/18/2023  CHIEF COMPLAINT: f/u of back pain   CURRENT THERAPY:  Azacitidine every 6 weeks  Oncology History   CMML (chronic myelomonocytic leukemia) (HCC) diagnosed in 10/2021 ---She developed worsening back pain, pelvic MRI 07/04/21 showed a mass in the presacral space. 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  -She began azacitadine on 02/06/22. she has had multiple hospital admissions and her performance status has been very low.  Changed her chemo to every 6 weeks in 08/2022 due to her fatigue. -she is tolerating every 6 weeks much better, feels better and able to do all her ADL now -she saw Dr. Sharyne Richters in Feb 2024, who recommends to continue current therapy  -Due to worsening low back pain, she completed palliative radiation on 07/02/2023    Assessment and Plan    Chronic Myelomonocytic Leukemia (CMML) Stable with ongoing chemotherapy. No reported side effects from recent treatment. -Continue current chemotherapy regimen. -I reviewed her recent pelvic MRI findings, which showed slightly enlarged presacral mass -given stable blood counts and lack of other effective treatment options, will continue azacitidine  Sacral Fracture New diagnosis following  recent imaging. Pain is present but manageable with current medication. Fracture likely due to frailty of bones secondary to CMML and previous radiation therapy. -Refer to Orthopedic Surgeon (Dr. Ranell Patrick at [facility]) for evaluation and management. -Send patient's medical notes and MRI scan report to Orthopedic Surgeon.  Pain Management Current pain medication (Oxycodone) causing drowsiness and difficulty with pill splitting. Pain is never completely resolved. -Change medication to Percocet (Oxycodone and Acetaminophen) for easier splitting and potential increased pain relief. -Prescribe 60 tablets of Percocet to be taken every 8 hours as needed.  Follow-up Monitor patient's response to new pain medication and outcome of Orthopedic consultation. Continue to manage CMML with current chemotherapy regimen.  -f/u in 2-3 weeks    SUMMARY OF ONCOLOGIC HISTORY: Oncology History Overview Note   Cancer Staging  CMML (chronic myelomonocytic leukemia) (HCC) Staging form: Chronic Myeloid Leukemia, AJCC 8th Edition - Clinical stage from 01/12/2022: Bone marrow blast count (%): 12, Additional clonal changes: Unknown - Signed by Malachy Mood, MD on 01/12/2022 Stage prefix: Initial diagnosis     CMML (chronic myelomonocytic leukemia) (HCC)  11/09/2021 Initial Biopsy   DIAGNOSIS:   -  Monoclonal B-cell population with co-expression of CD5 comprises 17%  of all lymphocytes  -  See comment   COMMENT:  In addition to the clonal B-cell population, there is a myeloblast  population (CD34, CD38, HLA-DR, CD117, CD123 and CD33) that comprises 2% of the total cellular events.  Please see concurrent tissue biopsy (below) for additional work-up and final diagnosis.    FINAL MICROSCOPIC DIAGNOSIS:   A. SOFT TISSUE MASS, PRE SACRAL, NEEDLE CORE BIOPSY:  -  Chronic lymphocytic leukemia/small lymphocytic lymphoma  -  Extra medullary hematopoiesis  -  See comment  COMMENT:  The biopsy consists of multiple  soft tissue cores with lymphoid nodules and a dense hematopoietic infiltrate consistent with extra medullary hematopoiesis.  MPO and E-cadherin highlight myeloid and erythroid precursors respectively.  CD34 highlights increased vasculature and is also positive within the cytoplasm of megakaryocytes.  A few small, immature mononuclear cells appear to be positive for CD34 and CD117. TdT shows rare, scattered positive cells.  CD20 highlights aggregates of B cells which are admixed with CD3 positive T cells.  T cells are an admixture of CD4 and CD8.  The B cells are also positive for CD5, CD23 and Bcl-2.  The B cells do not show significant staining for CD10, BCL6 or cyclin D1.  CD138 highlights scattered plasma cells which are polytypic by kappa and lambda in situ hybridization.  Flow cytometry performed on the sample (see WL [id number]) identified a kappa restricted CD5 positive B-cell population comprising 70% of lymphocytes.  In addition, a small myeloblast population comprised 2% of the total cellular events.   Overall, the findings are consistent with soft tissue involvement by  chronic lymphocytic leukemia/small lymphocytic lymphoma and extra medullary hematopoiesis. In reviewing the patient's CBC data (macrocytic anemia and thrombocytopenia), I would recommend a bone marrow biopsy to assess for marrow involvement by CLL/SLL.    12/23/2021 Imaging   EXAM: CT CHEST, ABDOMEN, AND PELVIS WITH CONTRAST  IMPRESSION: 1. Slight interval enlargement of a presacral soft tissue mass measuring 7.2 x 4.7 cm, previously 6.9 x 4.1 cm on prior MR of the pelvis dated 07/04/2021. By report, this represents a biopsy proven lymphoma. 2. Pleural nodule of the dependent right lower lobe overlying the posterior right tenth rib and pleural or paraspinous soft tissue mass overlying the right aspect of the T10 vertebral body, very slightly enlarged compared to prior examination of the chest dated 06/25/2020, consistent  with additional sites of lymphomatous involvement given very indolent growth. These could be better assessed for metabolic activity by FDG PET/CT if desired. 3. There is mild, bibasilar predominant pulmonary fibrosis in a pattern featuring irregular peripheral interstitial opacity, septal thickening, but without clear evidence of subpleural bronchiolectasis or honeycombing, with a somewhat asymmetric distribution most conspicuously involving the right lower lobe and lingula. These findings are significantly worsened when compared to prior examination dated 06/25/2020, particularly in the right lower lobe. Given interval change, this may reflect sequelae of interval infection or aspiration, however appearance is generally suspicious for fibrotic interstitial lung disease, and if characterized by ATS pulmonary fibrosis is in an "indeterminate for UIP" pattern, differential considerations including both UIP and NSIP. 4. Emphysema.   Aortic Atherosclerosis (ICD10-I70.0) and Emphysema (ICD10-J43.9).   01/05/2022 Pathology Results   DIAGNOSIS:   BONE MARROW, ASPIRATE, CLOT, CORE:  -Hypercellular bone marrow for age with features of  myelodysplastic/myeloproliferative neoplasm  -Minor abnormal B-cell population  -See comment   PERIPHERAL BLOOD:  -Macrocytic anemia  -Neutrophilic left shift and monocytosis  -Thrombocytopenia   COMMENT:  The bone marrow is hypercellular for age with dyspoietic changes  involving myeloid cell lines associated with monocytosis and increased number of blastic cells (12%) as primarily seen by morphology, many of which display monocytic features.  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  In this background, there are several predominantly small lymphoid aggregates mostly composed of small  lymphoid cells.  By flow cytometry, a minor abnormal B-cell population expressing CD5 is seen and representing  2% of all cells.  This correlate with previously known B-cell lymphoproliferative process.  Correlation with cytogenetic and FISH studies is strongly recommended.    DIAGNOSIS:   -Increased number of monocytic cells present (25%)  -Minor abnormal B-cell population identified.  -See comment   COMMENT:  Flow cytometric analysis shows increased number of monocytic cells representing 25% of all cells but without aberrant phenotype or CD34 expression.  A significant CD34-positive blastic population is not identified.  The lymphoid population shows a minor B-cell population representing 2% of all cells and expressing B-cell antigens including CD20 associated with CD5, CD200 and possibly dim kappa expression.  The latter findings are abnormal and correlate with previously known B-cell lymphoproliferative process.  No significant T-cell phenotypic abnormalities identified.    01/12/2022 Initial Diagnosis   CMML (chronic myelomonocytic leukemia) (HCC)   01/12/2022 Cancer Staging   Staging form: Chronic Myeloid Leukemia, AJCC 8th Edition - Clinical stage from 01/12/2022: Bone marrow blast count (%): 12, Additional clonal changes: Unknown - Signed by Malachy Mood, MD on 01/12/2022 Stage prefix: Initial diagnosis   02/06/2022 - 08/11/2022 Chemotherapy   Patient is on Treatment Plan : MYELODYSPLASIA  Azacitidine IV D1-7 q28d     02/06/2022 -  Chemotherapy   Patient is on Treatment Plan : MYELODYSPLASIA  Azacitidine IV D1-7 q28d     06/03/2023 Imaging   MR lumbar spine with and without contrast IMPRESSION: 1. Long-standing presacral mass, at least doubled in size since 2016, now at least 8 cm in length. There is internal fat components and differential considerations include myelolipoma, liposarcoma, and teratoma. 2. 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 3. L2-3  left paracentral extrusion compressing the left L3 nerve root. 4. Right foraminal impingement at L3-4 to L4-5, greatest at L4-5. Right subarticular recess narrowing at L3-4 and L4-5.   07/09/2023 Imaging   CT abdomen and pelvis with contrast  IMPRESSION: No acute findings in the abdomen or pelvis. Specifically, no findings to explain the patient's history of pain. 2. Interval progression of presacral and right thoracic paraspinal soft tissue lesions compatible with mild progression of disease since 06/19/2022. Small posterior right thoracic chest wall lesion also mildly progressive. 3. Mild circumferential wall thickening distal esophagus. Esophagitis would be a consideration. 4.  Aortic Atherosclerosis (ICD10-I70.0).      Discussed the use of AI scribe software for clinical note transcription with the patient, who gave verbal consent to proceed.  History of Present Illness   The patient, a 77 year old female with a history of Chronic Myelomonocytic Leukemia (CMML), presents for a follow-up visit. She has been experiencing significant back pain, which was found to be due to a fracture in the sacrum. The patient reports not having fallen or experienced any trauma that could have caused the fracture.  The patient has been seeing a back doctor, who is likely an orthopedic surgeon, for her back pain. She has also been taking oxycodone for pain management, but reports that it makes her drowsy and she is never completely pain-free. She has been trying to split the pills to manage the drowsiness, but this has resulted in the pills crumbling.  The patient also reports that she has been undergoing chemotherapy for her CMML, which has not been causing her any problems. However, she notes that the radiation treatment has been bothersome.         All other systems were reviewed with the patient and are negative.  MEDICAL HISTORY:  Past Medical History:  Diagnosis Date  Arthritis    Rheumatoid  arthritis   Celiac disease    Chronic kidney disease    stage 3 from MD notes   COPD (chronic obstructive pulmonary disease) (HCC)    Dyspnea    with going up stairs   Family history of adverse reaction to anesthesia    father had hard time waking up   Headache    sinus headaches   Hot flashes    Hypertension    Iron deficiency anemia    Pneumonia    per patient "I have walking pneumonia"    SURGICAL HISTORY: Past Surgical History:  Procedure Laterality Date   COLONOSCOPY     ECTOPIC PREGNANCY SURGERY      x 2   IR IMAGING GUIDED PORT INSERTION  01/31/2022   REVERSE SHOULDER ARTHROPLASTY Right 02/02/2017   Procedure: RIGHT REVERSE SHOULDER ARTHROPLASTY;  Surgeon: Beverely Low, MD;  Location: [facility] OR;  Service: Orthopedics;  Laterality: Right;    I have reviewed the social history and family history with the patient and they are unchanged from previous note.  ALLERGIES:  is allergic to digoxin and related, gluten meal, penicillins, fexofenadine, alendronate, and hydroxychloroquine.  MEDICATIONS:  Current Outpatient Medications  Medication Sig Dispense Refill   oxyCODONE-acetaminophen (PERCOCET/ROXICET) 5-325 MG tablet Take 0.5-1 tablets by mouth every 4 (four) hours as needed for severe pain (pain score 7-10). 60 tablet 0   acetaminophen-codeine (TYLENOL #3) 300-30 MG tablet Take 1 tablet by mouth every 6 (six) hours as needed for moderate pain. 20 tablet 0   apixaban (ELIQUIS) 5 MG TABS tablet Take 1 tablet (5 mg total) by mouth 2 (two) times daily. 60 tablet 11   denosumab (PROLIA) 60 MG/ML SOSY injection Inject 60 mg into the skin every 6 (six) months.     diltiazem (CARDIZEM CD) 360 MG 24 hr capsule Take 1 capsule (360 mg total) by mouth daily. 90 capsule 3   furosemide (LASIX) 20 MG tablet Take 1 tablet (20 mg total) by mouth daily. 30 tablet 11   gabapentin (NEURONTIN) 300 MG capsule Take 300 mg by mouth in the morning.     Metoprolol Tartrate 75 MG TABS Take 2 tablets (150  mg total) by mouth 2 (two) times daily. 360 tablet 2   oxyCODONE (ROXICODONE) 5 MG immediate release tablet Take 0.5-1 tablets (2.5-5 mg total) by mouth every 4 (four) hours as needed for severe pain (pain score 7-10). 30 tablet 0   pantoprazole (PROTONIX) 20 MG tablet Take 1 tablet (20 mg total) by mouth daily. For three weeks (Patient not taking: Reported on 09/28/2023) 21 tablet 0   potassium chloride SA (KLOR-CON M20) 20 MEQ tablet Take 1 tablet (20 mEq total) by mouth daily. Take with furosemide 90 tablet 3   predniSONE (DELTASONE) 5 MG tablet Take 5 mg by mouth daily with breakfast.     Tiotropium Bromide-Olodaterol (STIOLTO RESPIMAT) 2.5-2.5 MCG/ACT AERS Inhale 2 puffs into the lungs daily. 4 g 11   traMADol (ULTRAM) 50 MG tablet Take 1-2 tablets (50-100 mg total) by mouth every 6 (six) hours as needed. 15 tablet 0   No current facility-administered medications for this visit.   Facility-Administered Medications Ordered in Other Visits  Medication Dose Route Frequency Provider Last Rate Last Admin   acetaminophen (TYLENOL) 325 MG tablet            diphenhydrAMINE (BENADRYL) 25 mg capsule             PHYSICAL EXAMINATION: ECOG PERFORMANCE  STATUS: 3 - Symptomatic, >50% confined to bed  Vitals:   10/18/23 1000  BP: 106/75  Pulse: 81  Resp: 18  Temp: 97.6 F (36.4 C)  SpO2: 99%   Wt Readings from Last 3 Encounters:  10/18/23 111 lb 4.8 oz (50.5 kg)  09/28/23 106 lb (48.1 kg)  09/03/23 107 lb (48.5 kg)     GENERAL:alert, no distress and comfortable, in wheelchair  SKIN: skin color, texture, turgor are normal, no rashes or significant lesions EYES: normal, Conjunctiva are pink and non-injected, sclera clear Musculoskeletal:no cyanosis of digits and no clubbing  NEURO: alert & oriented x 3 with fluent speech, no focal motor/sensory deficits    LABORATORY DATA:  I have reviewed the data as listed    Latest Ref Rng & Units 10/08/2023    1:21 PM 10/04/2023   11:14 AM  09/19/2023    1:38 PM  CBC  WBC 4.0 - 10.5 K/uL 15.7  15.2  5.2   Hemoglobin 12.0 - 15.0 g/dL 9.4  11.9  9.8   Hematocrit 36.0 - 46.0 % 29.3  33.6  30.6   Platelets 150 - 400 K/uL 186  168  75         Latest Ref Rng & Units 10/08/2023    1:21 PM 10/04/2023   11:14 AM 09/19/2023    1:38 PM  CMP  Glucose 70 - 99 mg/dL 90  147  829   BUN 8 - 23 mg/dL 23  24  17    Creatinine 0.44 - 1.00 mg/dL 5.62  1.30  8.65   Sodium 135 - 145 mmol/L 137  136  139   Potassium 3.5 - 5.1 mmol/L 4.0  4.2  4.3   Chloride 98 - 111 mmol/L 103  102  103   CO2 22 - 32 mmol/L 27  27  28    Calcium 8.9 - 10.3 mg/dL 9.6  9.9  9.8   Total Protein 6.5 - 8.1 g/dL  6.5  6.4   Total Bilirubin 0.3 - 1.2 mg/dL  0.8  0.8   Alkaline Phos 38 - 126 U/L  63  70   AST 15 - 41 U/L  18  18   ALT 0 - 44 U/L  19  15       RADIOGRAPHIC STUDIES: I have personally reviewed the radiological images as listed and agreed with the findings in the report. No results found.    No orders of the defined types were placed in this encounter.  All questions were answered. The patient knows to call the clinic with any problems, questions or concerns. No barriers to learning was detected. The total time spent in the appointment was 25 minutes.     Malachy Mood, MD 10/18/2023

## 2023-10-23 ENCOUNTER — Telehealth: Payer: Self-pay

## 2023-10-23 ENCOUNTER — Other Ambulatory Visit: Payer: Self-pay

## 2023-10-23 NOTE — Telephone Encounter (Signed)
Attempted to call pt. Received Vm. Advise pt to return call back to see if she has a scheduled appt. With the ortho.

## 2023-10-23 NOTE — Telephone Encounter (Signed)
  Faxed last office note and MRI results to [facility] attention Dr. Ranell Patrick as per Dr. Mosetta Putt. Left message for Dr.Norris to call Dr. Mosetta Putt on her cell phone. Received fax confirmation of receipt.

## 2023-10-23 NOTE — Telephone Encounter (Signed)
Open in error

## 2023-10-23 NOTE — Telephone Encounter (Signed)
-----   Message from Malachy Mood sent at 10/23/2023  7:10 AM EDT ----- Please check if she has appointment with ortho, thanks   Malachy Mood

## 2023-10-25 ENCOUNTER — Encounter: Payer: Self-pay | Admitting: Hematology

## 2023-10-25 NOTE — Telephone Encounter (Signed)
Enter in error

## 2023-10-31 DIAGNOSIS — M5451 Vertebrogenic low back pain: Secondary | ICD-10-CM | POA: Diagnosis not present

## 2023-10-31 DIAGNOSIS — M8438XA Stress fracture, other site, initial encounter for fracture: Secondary | ICD-10-CM | POA: Diagnosis not present

## 2023-11-08 DIAGNOSIS — M0579 Rheumatoid arthritis with rheumatoid factor of multiple sites without organ or systems involvement: Secondary | ICD-10-CM | POA: Diagnosis not present

## 2023-11-11 ENCOUNTER — Encounter: Payer: Self-pay | Admitting: Hematology

## 2023-11-12 ENCOUNTER — Emergency Department (HOSPITAL_COMMUNITY): Payer: Medicare Other

## 2023-11-12 ENCOUNTER — Other Ambulatory Visit: Payer: Self-pay

## 2023-11-12 ENCOUNTER — Inpatient Hospital Stay (HOSPITAL_COMMUNITY)
Admission: EM | Admit: 2023-11-12 | Discharge: 2023-11-14 | DRG: 309 | Disposition: A | Discharge status: Home Health | Payer: Medicare Other | Source: Ambulatory Visit | Attending: Family Medicine | Admitting: Family Medicine

## 2023-11-12 ENCOUNTER — Inpatient Hospital Stay (HOSPITAL_BASED_OUTPATIENT_CLINIC_OR_DEPARTMENT_OTHER): Payer: Medicare Other | Admitting: Physician Assistant

## 2023-11-12 ENCOUNTER — Inpatient Hospital Stay: Payer: Medicare Other

## 2023-11-12 VITALS — BP 127/78 | HR 139 | Temp 99.1°F | Resp 15 | Ht 63.0 in | Wt 103.0 lb

## 2023-11-12 DIAGNOSIS — Z79899 Other long term (current) drug therapy: Secondary | ICD-10-CM

## 2023-11-12 DIAGNOSIS — E440 Moderate protein-calorie malnutrition: Secondary | ICD-10-CM | POA: Diagnosis present

## 2023-11-12 DIAGNOSIS — C9310 Chronic myelomonocytic leukemia not having achieved remission: Secondary | ICD-10-CM | POA: Insufficient documentation

## 2023-11-12 DIAGNOSIS — Z880 Allergy status to penicillin: Secondary | ICD-10-CM

## 2023-11-12 DIAGNOSIS — R000 Tachycardia, unspecified: Secondary | ICD-10-CM | POA: Insufficient documentation

## 2023-11-12 DIAGNOSIS — K5730 Diverticulosis of large intestine without perforation or abscess without bleeding: Secondary | ICD-10-CM | POA: Diagnosis not present

## 2023-11-12 DIAGNOSIS — I700 Atherosclerosis of aorta: Secondary | ICD-10-CM | POA: Diagnosis not present

## 2023-11-12 DIAGNOSIS — Z833 Family history of diabetes mellitus: Secondary | ICD-10-CM

## 2023-11-12 DIAGNOSIS — E1122 Type 2 diabetes mellitus with diabetic chronic kidney disease: Secondary | ICD-10-CM | POA: Diagnosis present

## 2023-11-12 DIAGNOSIS — C7951 Secondary malignant neoplasm of bone: Secondary | ICD-10-CM | POA: Diagnosis present

## 2023-11-12 DIAGNOSIS — M533 Sacrococcygeal disorders, not elsewhere classified: Secondary | ICD-10-CM | POA: Diagnosis not present

## 2023-11-12 DIAGNOSIS — I4821 Permanent atrial fibrillation: Principal | ICD-10-CM | POA: Diagnosis present

## 2023-11-12 DIAGNOSIS — I5022 Chronic systolic (congestive) heart failure: Secondary | ICD-10-CM | POA: Diagnosis present

## 2023-11-12 DIAGNOSIS — Z91018 Allergy to other foods: Secondary | ICD-10-CM

## 2023-11-12 DIAGNOSIS — E876 Hypokalemia: Secondary | ICD-10-CM | POA: Diagnosis present

## 2023-11-12 DIAGNOSIS — E871 Hypo-osmolality and hyponatremia: Secondary | ICD-10-CM | POA: Diagnosis present

## 2023-11-12 DIAGNOSIS — Z87891 Personal history of nicotine dependence: Secondary | ICD-10-CM | POA: Diagnosis not present

## 2023-11-12 DIAGNOSIS — T451X5A Adverse effect of antineoplastic and immunosuppressive drugs, initial encounter: Secondary | ICD-10-CM | POA: Diagnosis present

## 2023-11-12 DIAGNOSIS — M48061 Spinal stenosis, lumbar region without neurogenic claudication: Secondary | ICD-10-CM | POA: Diagnosis not present

## 2023-11-12 DIAGNOSIS — M549 Dorsalgia, unspecified: Secondary | ICD-10-CM | POA: Insufficient documentation

## 2023-11-12 DIAGNOSIS — D6481 Anemia due to antineoplastic chemotherapy: Secondary | ICD-10-CM | POA: Diagnosis present

## 2023-11-12 DIAGNOSIS — D649 Anemia, unspecified: Secondary | ICD-10-CM | POA: Insufficient documentation

## 2023-11-12 DIAGNOSIS — Z95828 Presence of other vascular implants and grafts: Secondary | ICD-10-CM

## 2023-11-12 DIAGNOSIS — I130 Hypertensive heart and chronic kidney disease with heart failure and stage 1 through stage 4 chronic kidney disease, or unspecified chronic kidney disease: Secondary | ICD-10-CM | POA: Diagnosis not present

## 2023-11-12 DIAGNOSIS — Z7951 Long term (current) use of inhaled steroids: Secondary | ICD-10-CM

## 2023-11-12 DIAGNOSIS — Z923 Personal history of irradiation: Secondary | ICD-10-CM

## 2023-11-12 DIAGNOSIS — Z883 Allergy status to other anti-infective agents status: Secondary | ICD-10-CM

## 2023-11-12 DIAGNOSIS — I4891 Unspecified atrial fibrillation: Secondary | ICD-10-CM | POA: Insufficient documentation

## 2023-11-12 DIAGNOSIS — M069 Rheumatoid arthritis, unspecified: Secondary | ICD-10-CM | POA: Diagnosis present

## 2023-11-12 DIAGNOSIS — N1831 Chronic kidney disease, stage 3a: Secondary | ICD-10-CM | POA: Diagnosis present

## 2023-11-12 DIAGNOSIS — J449 Chronic obstructive pulmonary disease, unspecified: Secondary | ICD-10-CM | POA: Diagnosis not present

## 2023-11-12 DIAGNOSIS — N3289 Other specified disorders of bladder: Secondary | ICD-10-CM | POA: Diagnosis not present

## 2023-11-12 DIAGNOSIS — C9210 Chronic myeloid leukemia, BCR/ABL-positive, not having achieved remission: Secondary | ICD-10-CM | POA: Diagnosis not present

## 2023-11-12 DIAGNOSIS — I517 Cardiomegaly: Secondary | ICD-10-CM | POA: Diagnosis not present

## 2023-11-12 DIAGNOSIS — M545 Low back pain, unspecified: Secondary | ICD-10-CM

## 2023-11-12 DIAGNOSIS — G893 Neoplasm related pain (acute) (chronic): Secondary | ICD-10-CM | POA: Diagnosis present

## 2023-11-12 DIAGNOSIS — Z7952 Long term (current) use of systemic steroids: Secondary | ICD-10-CM

## 2023-11-12 DIAGNOSIS — M47896 Other spondylosis, lumbar region: Secondary | ICD-10-CM | POA: Diagnosis not present

## 2023-11-12 DIAGNOSIS — R1909 Other intra-abdominal and pelvic swelling, mass and lump: Secondary | ICD-10-CM | POA: Diagnosis not present

## 2023-11-12 DIAGNOSIS — Z96611 Presence of right artificial shoulder joint: Secondary | ICD-10-CM | POA: Diagnosis present

## 2023-11-12 DIAGNOSIS — Z681 Body mass index (BMI) 19 or less, adult: Secondary | ICD-10-CM

## 2023-11-12 DIAGNOSIS — Z7901 Long term (current) use of anticoagulants: Secondary | ICD-10-CM

## 2023-11-12 DIAGNOSIS — Z515 Encounter for palliative care: Secondary | ICD-10-CM | POA: Diagnosis not present

## 2023-11-12 DIAGNOSIS — E7800 Pure hypercholesterolemia, unspecified: Secondary | ICD-10-CM | POA: Diagnosis present

## 2023-11-12 DIAGNOSIS — Z8249 Family history of ischemic heart disease and other diseases of the circulatory system: Secondary | ICD-10-CM | POA: Diagnosis not present

## 2023-11-12 DIAGNOSIS — M47816 Spondylosis without myelopathy or radiculopathy, lumbar region: Secondary | ICD-10-CM | POA: Diagnosis present

## 2023-11-12 DIAGNOSIS — Z888 Allergy status to other drugs, medicaments and biological substances status: Secondary | ICD-10-CM

## 2023-11-12 DIAGNOSIS — M4807 Spinal stenosis, lumbosacral region: Secondary | ICD-10-CM | POA: Diagnosis not present

## 2023-11-12 DIAGNOSIS — M5126 Other intervertebral disc displacement, lumbar region: Secondary | ICD-10-CM | POA: Diagnosis not present

## 2023-11-12 LAB — CBC WITH DIFFERENTIAL (CANCER CENTER ONLY)
Abs Immature Granulocytes: 0.11 10*3/uL — ABNORMAL HIGH (ref 0.00–0.07)
Basophils Absolute: 0.1 10*3/uL (ref 0.0–0.1)
Basophils Relative: 1 %
Eosinophils Absolute: 0.1 10*3/uL (ref 0.0–0.5)
Eosinophils Relative: 1 %
HCT: 38.4 % (ref 36.0–46.0)
Hemoglobin: 12.2 g/dL (ref 12.0–15.0)
Immature Granulocytes: 1 %
Lymphocytes Relative: 13 %
Lymphs Abs: 1.4 10*3/uL (ref 0.7–4.0)
MCH: 33.4 pg (ref 26.0–34.0)
MCHC: 31.8 g/dL (ref 30.0–36.0)
MCV: 105.2 fL — ABNORMAL HIGH (ref 80.0–100.0)
Monocytes Absolute: 1.4 10*3/uL — ABNORMAL HIGH (ref 0.1–1.0)
Monocytes Relative: 13 %
Neutro Abs: 7.4 10*3/uL (ref 1.7–7.7)
Neutrophils Relative %: 71 %
Platelet Count: 222 10*3/uL (ref 150–400)
RBC: 3.65 MIL/uL — ABNORMAL LOW (ref 3.87–5.11)
RDW: 14.3 % (ref 11.5–15.5)
WBC Count: 10.5 10*3/uL (ref 4.0–10.5)
nRBC: 0 % (ref 0.0–0.2)

## 2023-11-12 LAB — CBC
HCT: 38.3 % (ref 36.0–46.0)
Hemoglobin: 12.2 g/dL (ref 12.0–15.0)
MCH: 33.9 pg (ref 26.0–34.0)
MCHC: 31.9 g/dL (ref 30.0–36.0)
MCV: 106.4 fL — ABNORMAL HIGH (ref 80.0–100.0)
Platelets: 230 10*3/uL (ref 150–400)
RBC: 3.6 MIL/uL — ABNORMAL LOW (ref 3.87–5.11)
RDW: 14.3 % (ref 11.5–15.5)
WBC: 11.6 10*3/uL — ABNORMAL HIGH (ref 4.0–10.5)
nRBC: 0 % (ref 0.0–0.2)

## 2023-11-12 LAB — URINALYSIS, ROUTINE W REFLEX MICROSCOPIC
Bilirubin Urine: NEGATIVE
Glucose, UA: NEGATIVE mg/dL
Hgb urine dipstick: NEGATIVE
Ketones, ur: 5 mg/dL — AB
Leukocytes,Ua: NEGATIVE
Nitrite: NEGATIVE
Protein, ur: NEGATIVE mg/dL
Specific Gravity, Urine: 1.012 (ref 1.005–1.030)
pH: 6 (ref 5.0–8.0)

## 2023-11-12 LAB — CMP (CANCER CENTER ONLY)
ALT: 10 U/L (ref 0–44)
AST: 16 U/L (ref 15–41)
Albumin: 4.2 g/dL (ref 3.5–5.0)
Alkaline Phosphatase: 115 U/L (ref 38–126)
Anion gap: 10 (ref 5–15)
BUN: 24 mg/dL — ABNORMAL HIGH (ref 8–23)
CO2: 27 mmol/L (ref 22–32)
Calcium: 9.8 mg/dL (ref 8.9–10.3)
Chloride: 98 mmol/L (ref 98–111)
Creatinine: 0.94 mg/dL (ref 0.44–1.00)
GFR, Estimated: >60 mL/min (ref >60)
Glucose, Bld: 115 mg/dL — ABNORMAL HIGH (ref 70–99)
Potassium: 4.1 mmol/L (ref 3.5–5.1)
Sodium: 135 mmol/L (ref 135–145)
Total Bilirubin: 0.8 mg/dL (ref <1.2)
Total Protein: 6.7 g/dL (ref 6.5–8.1)

## 2023-11-12 LAB — COMPREHENSIVE METABOLIC PANEL
ALT: 12 U/L (ref 0–44)
AST: 24 U/L (ref 15–41)
Albumin: 4.1 g/dL (ref 3.5–5.0)
Alkaline Phosphatase: 110 U/L (ref 38–126)
Anion gap: 16 — ABNORMAL HIGH (ref 5–15)
BUN: 23 mg/dL (ref 8–23)
CO2: 21 mmol/L — ABNORMAL LOW (ref 22–32)
Calcium: 9.3 mg/dL (ref 8.9–10.3)
Chloride: 99 mmol/L (ref 98–111)
Creatinine, Ser: 0.98 mg/dL (ref 0.44–1.00)
GFR, Estimated: 60 mL/min — ABNORMAL LOW (ref >60)
Glucose, Bld: 119 mg/dL — ABNORMAL HIGH (ref 70–99)
Potassium: 3.8 mmol/L (ref 3.5–5.1)
Sodium: 136 mmol/L (ref 135–145)
Total Bilirubin: 1.1 mg/dL (ref <1.2)
Total Protein: 7.2 g/dL (ref 6.5–8.1)

## 2023-11-12 LAB — TROPONIN I (HIGH SENSITIVITY)
Troponin I (High Sensitivity): 5 ng/L (ref <18)
Troponin I (High Sensitivity): 5 ng/L (ref <18)

## 2023-11-12 MED ORDER — FUROSEMIDE 40 MG PO TABS
20.0000 mg | ORAL_TABLET | Freq: Every day | ORAL | Status: DC
  Administered 2023-11-13 – 2023-11-14 (×2): 20 mg via ORAL
  Filled 2023-11-12 (×2): qty 1

## 2023-11-12 MED ORDER — ACETAMINOPHEN 325 MG PO TABS
650.0000 mg | ORAL_TABLET | Freq: Four times a day (QID) | ORAL | Status: DC | PRN
  Filled 2023-11-12 (×2): qty 2

## 2023-11-12 MED ORDER — ARFORMOTEROL TARTRATE 15 MCG/2ML IN NEBU
15.0000 ug | INHALATION_SOLUTION | Freq: Two times a day (BID) | RESPIRATORY_TRACT | Status: DC
  Administered 2023-11-13 – 2023-11-14 (×3): 15 ug via RESPIRATORY_TRACT
  Filled 2023-11-12 (×3): qty 2

## 2023-11-12 MED ORDER — OXYCODONE-ACETAMINOPHEN 5-325 MG PO TABS
0.5000 | ORAL_TABLET | ORAL | Status: DC | PRN
  Administered 2023-11-13: 1 via ORAL
  Filled 2023-11-12: qty 1

## 2023-11-12 MED ORDER — METOPROLOL TARTRATE 5 MG/5ML IV SOLN
5.0000 mg | Freq: Once | INTRAVENOUS | Status: AC
  Administered 2023-11-12: 5 mg via INTRAVENOUS
  Filled 2023-11-12: qty 5

## 2023-11-12 MED ORDER — MELATONIN 3 MG PO TABS: 3.0000 mg | ORAL_TABLET | Freq: Every evening | ORAL | Status: DC | PRN

## 2023-11-12 MED ORDER — SODIUM CHLORIDE 0.9% FLUSH
10.0000 mL | INTRAVENOUS | Status: DC | PRN
Start: 2023-11-12 — End: 2023-11-14

## 2023-11-12 MED ORDER — DILTIAZEM LOAD VIA INFUSION
20.0000 mg | Freq: Once | INTRAVENOUS | Status: AC
  Administered 2023-11-12: 20 mg via INTRAVENOUS
  Filled 2023-11-12: qty 20

## 2023-11-12 MED ORDER — SODIUM CHLORIDE 0.9% FLUSH
10.0000 mL | Freq: Two times a day (BID) | INTRAVENOUS | Status: DC
  Administered 2023-11-12 – 2023-11-13 (×2): 10 mL

## 2023-11-12 MED ORDER — CHLORHEXIDINE GLUCONATE CLOTH 2 % EX PADS
6.0000 | MEDICATED_PAD | Freq: Every day | CUTANEOUS | Status: DC
  Administered 2023-11-13 – 2023-11-14 (×2): 6 via TOPICAL

## 2023-11-12 MED ORDER — GLYCERIN (LAXATIVE) 2 G RE SUPP
1.0000 | Freq: Once | RECTAL | Status: DC
  Filled 2023-11-12: qty 1

## 2023-11-12 MED ORDER — UMECLIDINIUM BROMIDE 62.5 MCG/ACT IN AEPB
1.0000 | INHALATION_SPRAY | Freq: Every day | RESPIRATORY_TRACT | Status: DC
  Administered 2023-11-13 – 2023-11-14 (×2): 1 via RESPIRATORY_TRACT
  Filled 2023-11-12: qty 7

## 2023-11-12 MED ORDER — DILTIAZEM HCL-DEXTROSE 125-5 MG/125ML-% IV SOLN (PREMIX)
5.0000 mg/h | INTRAVENOUS | Status: DC
  Administered 2023-11-12 – 2023-11-13 (×2): 5 mg/h via INTRAVENOUS
  Filled 2023-11-12 (×2): qty 125

## 2023-11-12 MED ORDER — ONDANSETRON HCL 4 MG/2ML IJ SOLN
4.0000 mg | Freq: Once | INTRAMUSCULAR | Status: AC
  Administered 2023-11-12: 4 mg via INTRAVENOUS
  Filled 2023-11-12: qty 2

## 2023-11-12 MED ORDER — ONDANSETRON HCL 4 MG/2ML IJ SOLN: 4.0000 mg | Freq: Four times a day (QID) | INTRAMUSCULAR | Status: DC | PRN

## 2023-11-12 MED ORDER — POTASSIUM CHLORIDE CRYS ER 20 MEQ PO TBCR: 20.0000 meq | EXTENDED_RELEASE_TABLET | Freq: Every day | ORAL | Status: DC

## 2023-11-12 MED ORDER — METOPROLOL TARTRATE 50 MG PO TABS
150.0000 mg | ORAL_TABLET | Freq: Two times a day (BID) | ORAL | Status: DC
  Administered 2023-11-13 – 2023-11-14 (×3): 150 mg via ORAL
  Filled 2023-11-12 (×3): qty 3

## 2023-11-12 MED ORDER — APIXABAN 5 MG PO TABS
5.0000 mg | ORAL_TABLET | Freq: Two times a day (BID) | ORAL | Status: DC
  Administered 2023-11-12 – 2023-11-14 (×4): 5 mg via ORAL
  Filled 2023-11-12 (×4): qty 1

## 2023-11-12 MED ORDER — SORBITOL 70 % SOLN: 30.0000 mL | Freq: Every day | Status: DC | PRN

## 2023-11-12 MED ORDER — SODIUM CHLORIDE 0.9% FLUSH
10.0000 mL | Freq: Once | INTRAVENOUS | Status: AC
  Administered 2023-11-12: 10 mL

## 2023-11-12 MED ORDER — ACETAMINOPHEN 650 MG RE SUPP: 650.0000 mg | Freq: Four times a day (QID) | RECTAL | Status: DC | PRN

## 2023-11-12 MED ORDER — MORPHINE SULFATE (PF) 4 MG/ML IV SOLN
4.0000 mg | Freq: Once | INTRAVENOUS | Status: AC
  Administered 2023-11-12: 4 mg via INTRAVENOUS
  Filled 2023-11-12: qty 1

## 2023-11-12 MED ORDER — ONDANSETRON HCL 4 MG PO TABS: 4.0000 mg | ORAL_TABLET | Freq: Four times a day (QID) | ORAL | Status: DC | PRN

## 2023-11-12 MED ORDER — HYDROMORPHONE HCL 1 MG/ML IJ SOLN
1.0000 mg | Freq: Once | INTRAMUSCULAR | Status: AC
  Administered 2023-11-12: 1 mg via INTRAVENOUS
  Filled 2023-11-12: qty 1

## 2023-11-12 MED ORDER — IOHEXOL 300 MG/ML  SOLN
100.0000 mL | Freq: Once | INTRAMUSCULAR | Status: AC | PRN
  Administered 2023-11-12: 100 mL via INTRAVENOUS

## 2023-11-12 MED ORDER — SODIUM CHLORIDE 0.9 % IV BOLUS
500.0000 mL | Freq: Once | INTRAVENOUS | Status: AC
  Administered 2023-11-12: 500 mL via INTRAVENOUS

## 2023-11-12 MED ORDER — PREDNISONE 5 MG PO TABS
5.0000 mg | ORAL_TABLET | Freq: Every day | ORAL | Status: DC
  Administered 2023-11-13 – 2023-11-14 (×2): 5 mg via ORAL
  Filled 2023-11-12 (×2): qty 1

## 2023-11-12 NOTE — ED Notes (Signed)
..ED TO INPATIENT HANDOFF REPORT  Name/Age/Gender [name] 77 y.o. female  Code Status Code Status History     Date Active Date Inactive Code Status Order ID Comments User Context   06/19/2022 2303 06/23/2022 1857 Full Code [id number]  Therisa Doyne, MD Inpatient   05/19/2022 2000 05/21/2022 2109 Full Code [id number]  Charlsie Quest, MD Inpatient   03/29/2022 1747 04/13/2022 1653 Full Code [id number]  Glade Lloyd, MD ED   02/02/2017 1310 02/03/2017 1445 Full Code [id number]  Beverely Low, MD Inpatient   02/12/2015 1125 02/13/2015 0343 Full Code [id number]  Oley Balm, MD HOV      Advance Directive Documentation    Flowsheet Row Most Recent Value  Type of Advance Directive Healthcare Power of Attorney  Pre-existing out of facility DNR order (yellow form or pink MOST form) --  "MOST" Form in Place? --       Home/SNF/Other Home  Chief Complaint Atrial fibrillation with RVR (HCC) [I48.91]  Level of Care/Admitting Diagnosis ED Disposition     ED Disposition  Admit   Condition  --   Comment  Hospital Area: [facility] [[id number]]  Level of Care: Progressive [102]  Admit to Progressive based on following criteria: CARDIOVASCULAR & THORACIC of moderate stability with acute coronary syndrome symptoms/low risk myocardial infarction/hypertensive urgency/arrhythmias/heart failure potentially compromising stability and stable post cardiovascular intervention patients.  May admit patient to [facility] or [facility] if equivalent level of care is available:: No  Covid Evaluation: Asymptomatic - no recent exposure (last 10 days) testing not required  Diagnosis: Atrial fibrillation with RVR [facility]) [[id number]]  Admitting Physician: Buena Irish [[id number]]  Attending Physician: Buena Irish [phone]  Certification:: I certify this patient will need inpatient services for at least 2 midnights  Expected Medical Readiness: 11/16/2023          Medical  History Past Medical History:  Diagnosis Date   Arthritis    Rheumatoid arthritis   Celiac disease    Chronic kidney disease    stage 3 from MD notes   COPD (chronic obstructive pulmonary disease) (HCC)    Dyspnea    with going up stairs   Family history of adverse reaction to anesthesia    father had hard time waking up   Headache    sinus headaches   Hot flashes    Hypertension    Iron deficiency anemia    Pneumonia    per patient "I have walking pneumonia"    Allergies Allergies  Allergen Reactions   Digoxin And Related Rash    Generalized total body rash with immense itching   Gluten Meal Diarrhea   Penicillins Rash and Other (See Comments)      Did it involve swelling of the face/tongue/throat, SOB, or low BP? No Did it involve sudden or severe rash/hives, skin peeling, or any reaction on the inside of your mouth or nose? Yes Did you need to seek medical attention at a hospital or doctor's office? No When did it last happen?    "Many Years Ago"  If all above answers are "NO", may proceed with cephalosporin use.      Fexofenadine     Other Reaction(s): dry mouth   Alendronate Rash   Hydroxychloroquine Rash    IV Location/Drains/Wounds Patient Lines/Drains/Airways Status     Active Line/Drains/Airways     Name Placement date Placement time Site Days   Implanted Port 07/09/23 Right Chest 07/09/23  0855  Chest  126  Wound / Incision (Open or Dehisced) 11/09/21 Puncture Back Lower 11/09/21  1215  Back  733            Labs/Imaging Results for orders placed or performed during the hospital encounter of 11/12/23 (from the past 48 hour(s))  Urinalysis, Routine w reflex microscopic -Urine, Clean Catch     Status: Abnormal   Collection Time: 11/12/23  3:25 PM  Result Value Ref Range   Color, Urine YELLOW YELLOW   APPearance CLEAR CLEAR   Specific Gravity, Urine 1.012 1.005 - 1.030   pH 6.0 5.0 - 8.0   Glucose, UA NEGATIVE NEGATIVE mg/dL   Hgb urine  dipstick NEGATIVE NEGATIVE   Bilirubin Urine NEGATIVE NEGATIVE   Ketones, ur 5 (A) NEGATIVE mg/dL   Protein, ur NEGATIVE NEGATIVE mg/dL   Nitrite NEGATIVE NEGATIVE   Leukocytes,Ua NEGATIVE NEGATIVE    Comment: Performed at [facility], [address] [place], [place] [address]  CBC     Status: Abnormal   Collection Time: 11/12/23  3:42 PM  Result Value Ref Range   WBC 11.6 (H) 4.0 - 10.5 K/uL   RBC 3.60 (L) 3.87 - 5.11 MIL/uL   Hemoglobin 12.2 12.0 - 15.0 g/dL   HCT 19.1 47.8 - 29.5 %   MCV 106.4 (H) 80.0 - 100.0 fL   MCH 33.9 26.0 - 34.0 pg   MCHC 31.9 30.0 - 36.0 g/dL   RDW 62.1 30.8 - 65.7 %   Platelets 230 150 - 400 K/uL   nRBC 0.0 0.0 - 0.2 %    Comment: Performed at [facility], [address], [place], [place] [address]  Troponin I (High Sensitivity)     Status: None   Collection Time: 11/12/23  3:42 PM  Result Value Ref Range   Troponin I (High Sensitivity) 5 <18 ng/L    Comment: (NOTE) Elevated high sensitivity troponin I (hsTnI) values and significant  changes across serial measurements may suggest ACS but many other  chronic and acute conditions are known to elevate hsTnI results.  Refer to the "Links" section for chest pain algorithms and additional  guidance. Performed at [facility], [address] [place], [place] [address]   Comprehensive metabolic panel     Status: Abnormal   Collection Time: 11/12/23  3:42 PM  Result Value Ref Range   Sodium 136 135 - 145 mmol/L   Potassium 3.8 3.5 - 5.1 mmol/L   Chloride 99 98 - 111 mmol/L   CO2 21 (L) 22 - 32 mmol/L   Glucose, Bld 119 (H) 70 - 99 mg/dL    Comment: Glucose reference range applies only to samples taken after fasting for at least 8 hours.   BUN 23 8 - 23 mg/dL   Creatinine, Ser 4.13 0.44 - 1.00 mg/dL   Calcium 9.3 8.9 - 24.4 mg/dL   Total Protein 7.2 6.5 - 8.1 g/dL   Albumin 4.1 3.5 - 5.0 g/dL   AST 24 15 - 41 U/L   ALT 12 0 - 44 U/L    Alkaline Phosphatase 110 38 - 126 U/L   Total Bilirubin 1.1 <1.2 mg/dL   GFR, Estimated 60 (L) >60 mL/min    Comment: (NOTE) Calculated using the CKD-EPI Creatinine Equation (2021)    Anion gap 16 (H) 5 - 15    Comment: Performed at [facility], [address], [place], [place] [address]  Troponin I (High Sensitivity)     Status: None  Collection Time: 11/12/23  5:28 PM  Result Value Ref Range   Troponin I (High Sensitivity) 5 <18 ng/L    Comment: (NOTE) Elevated high sensitivity troponin I (hsTnI) values and significant  changes across serial measurements may suggest ACS but many other  chronic and acute conditions are known to elevate hsTnI results.  Refer to the "Links" section for chest pain algorithms and additional  guidance. Performed at [facility], [address], [place], [place] [address]    CT ABDOMEN PELVIS W CONTRAST  Result Date: 11/12/2023 CLINICAL DATA:  Left-sided back pain for several days, history of the key Mia EXAM: CT ABDOMEN AND PELVIS WITH CONTRAST TECHNIQUE: Multidetector CT imaging of the abdomen and pelvis was performed using the standard protocol following bolus administration of intravenous contrast. RADIATION DOSE REDUCTION: This exam was performed according to the departmental dose-optimization program which includes automated exposure control, adjustment of the mA and/or kV according to patient size and/or use of iterative reconstruction technique. CONTRAST:  OMNIPAQUE IOHEXOL 300 MG/ML  SOLN COMPARISON:  07/09/2023 FINDINGS: Lower chest: Small right pleural effusion is noted. Stable right paraspinal mass is seen although incompletely evaluated when compared with the prior examination. Enhancing lesion along the right lung base posteriorly within the pleural space is noted as well this is also stable from prior study. Hepatobiliary: No focal liver abnormality is seen. No gallstones, gallbladder wall thickening,  or biliary dilatation. Pancreas: Unremarkable. No pancreatic ductal dilatation or surrounding inflammatory changes. Spleen: Normal in size without focal abnormality. Adrenals/Urinary Tract: Adrenal glands are within normal limits. Kidneys demonstrate cystic change bilaterally worse on the right than the left. No follow-up is recommended as these are stable from the prior exam. The bladder is partially distended. Stomach/Bowel: Scattered diverticular change of the colon is noted. No obstructive or inflammatory changes of the colon are seen. The appendix is within normal limits. Small bowel and stomach are unremarkable. Vascular/Lymphatic: Aortic atherosclerosis. No enlarged abdominal or pelvic lymph nodes. Reproductive: Uterus and bilateral adnexa are unremarkable. Other: Presacral mass lesions are again identified similar to that seen on the prior study. They demonstrate mixed attenuation and are consistent with the given clinical history of underlying leukemia. Musculoskeletal: No acute or significant osseous findings. IMPRESSION: Stable soft tissue mass lesions in the presacral region as well as along the posterior right pleural margin and right paraspinal space. These findings are similar to that seen on prior examination consistent with the given clinical history. Diverticulosis without diverticulitis. Electronically Signed   By: Alcide Clever M.D.   On: 11/12/2023 19:58   DG Chest Port 1 View  Result Date: 11/12/2023 CLINICAL DATA:  Atrial fibrillation EXAM: PORTABLE CHEST 1 VIEW COMPARISON:  07/09/2023 FINDINGS: Right chest port remains in place. Mild cardiomegaly. Aortic atherosclerosis. No focal airspace consolidation, pleural effusion, or pneumothorax. Right shoulder prosthesis. IMPRESSION: No active disease. Electronically Signed   By: Duanne Guess D.O.   On: 11/12/2023 18:40   CT L-SPINE NO CHARGE  Result Date: 11/12/2023 CLINICAL DATA:  New left-sided back pain EXAM: CT LUMBAR SPINE WITHOUT  CONTRAST TECHNIQUE: Multidetector CT imaging of the lumbar spine was performed without intravenous contrast administration. Multiplanar CT image reconstructions were also generated. RADIATION DOSE REDUCTION: This exam was performed according to the departmental dose-optimization program which includes automated exposure control, adjustment of the mA and/or kV according to patient size and/or use of iterative reconstruction technique. COMPARISON:  Pelvis MR 10/12/23 FINDINGS: Segmentation: 5 lumbar type vertebrae. Alignment: Trace retrolisthesis of L1  on L2. There is right lateral listhesis of L3 on L4. Vertebrae: There is mild asymmetric height loss of the superior endplate of T12 (series 5, image 58). Redemonstrated heterogeneous appearance of the sacrum, in keeping with previously seen on bilateral sacral insufficiency fractures. Paraspinal and other soft tissues: Aortic atherosclerotic calcifications. Redemonstrated is a partially imaged presacral mass. Diverticulosis without diverticulitis. Disc levels: There is likely moderate to severe spinal canal narrowing at L3-L4. Severe right-sided neural foraminal stenosis at L4-L5 and L5-S1 severe left-sided neural foraminal stenosis at L5-S1 IMPRESSION: 1. Mild asymmetric height loss of the superior endplate of T12, which is age indeterminate. Correlate with point tenderness. 2. Redemonstrated heterogeneous appearance of the sacrum, in keeping with previously seen bilateral sacral insufficiency fractures. 3. Redemonstrated partially imaged presacral mass. See same day CT abdomen/pelvis for further characterization. 4. Likely moderate to severe spinal canal narrowing at L3-L4. 5. Severe right-sided neural foraminal stenosis at L4-L5 and L5-S1 and severe left-sided neural foraminal stenosis at L5-S1. Aortic Atherosclerosis (ICD10-I70.0). Electronically Signed   By: Lorenza Cambridge M.D.   On: 11/12/2023 18:22    Pending Labs Unresulted Labs (From admission, onward)     None       Vitals/Pain Today's Vitals   11/12/23 2020 11/12/23 2030 11/12/23 2040 11/12/23 2045  BP: (!) 106/54 112/74 118/62   Pulse: (!) 52 (!) 49 62 96  Resp: 15 19 15 19   Temp:      TempSrc:      SpO2: 95% 93% 97% 96%  PainSc:        Isolation Precautions No active isolations  Medications Medications  diltiazem (CARDIZEM) 1 mg/mL load via infusion 20 mg (20 mg Intravenous Bolus from Bag 11/12/23 1708)    And  diltiazem (CARDIZEM) 125 mg in dextrose 5% 125 mL (1 mg/mL) infusion (10 mg/hr Intravenous Rate/Dose Change 11/12/23 1925)  ondansetron (ZOFRAN) injection 4 mg (4 mg Intravenous Given 11/12/23 1523)  morphine (PF) 4 MG/ML injection 4 mg (4 mg Intravenous Given 11/12/23 1532)  sodium chloride 0.9 % bolus 500 mL (0 mLs Intravenous Stopped 11/12/23 1716)  metoprolol tartrate (LOPRESSOR) injection 5 mg (5 mg Intravenous Given 11/12/23 1525)  sodium chloride 0.9 % bolus 500 mL (0 mLs Intravenous Stopped 11/12/23 1905)  iohexol (OMNIPAQUE) 300 MG/ML solution 100 mL (100 mLs Intravenous Contrast Given 11/12/23 1641)  HYDROmorphone (DILAUDID) injection 1 mg (1 mg Intravenous Given 11/12/23 1710)    Mobility walks with person assist

## 2023-11-12 NOTE — ED Triage Notes (Signed)
Pt from cancer center c/o new left side/back pain for 5 days and was found to be in afib rvr at cancer center, pt has hx of this

## 2023-11-12 NOTE — H&P (Signed)
History and Physical    Patient: [name] WNU:[id number] DOB: 27-Jul-1946 DOA: 11/12/2023 DOS: the patient was seen and examined on 11/12/2023 PCP: [name], MD  Patient coming from: Home  Chief Complaint:  Chief Complaint  Patient presents with   Joint Pain   HPI: [name] is a 77 y.o. 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  She presented to her oncology office today for routine follow-up.  While in the office she was noted to be in A-fib with RVR she was she was sent to the emergency department.  In the ER she was given IV beta-blocker but she continued to have trouble with rapid ventricular response so she was started on Cardizem drip.  The patient will be admitted on a Cardizem drip for rate control.  The patient does not have palpitations and cannot tell that her heart is racing.  She denies any fevers, chills, shortness of breath, or chest pain. She does complain of increased pain in her right side.  This pain is actually quite severe and has been going on for the last 4 weeks.  The patient did have an MRI to work the pain up about a month ago and that revealed bilateral sacral fractures and stress injury of the pubic bones. The patient reports however that this pain on her right side is new.  She was having pain previously but this particular pain is different from the pain she had before.  She denies any trauma.  No increase in pain when she ambulates or moves.  There is nothing she can do that changes the pain. She says it feels "internal".  Pelvic MRI 10/18/2023 IMPRESSION: 1. Bilateral vertical sacral ala fractures. 2. Abnormal edema in both pubic bones potentially from osteitis pubis or stress injuries. There is also a band of edema and enhancement anteriorly in the right inferior pubic  ramus which may represent nondisplaced fracture or stress injury. 3. 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 Possibilities include extramedullary hematopoiesis, myelolipoma, teratoma, or liposarcoma. No sacral plexus impingement. 4. Dextroconvex lumbar scoliosis with lower lumbar spondylosis and degenerative disc disease better characterized on lumbar MRI from 06/03/2023. 5. Sigmoid colon diverticulosis.   Review of Systems: As mentioned in the history of present illness. All other systems reviewed and are negative. Past Medical History:  Diagnosis Date   Arthritis    Rheumatoid arthritis   Celiac disease    Chronic kidney disease    stage 3 from MD notes   COPD (chronic obstructive pulmonary disease) (HCC)    Dyspnea    with going up stairs   Family history of adverse reaction to anesthesia    father had hard time waking up   Headache    sinus headaches   Hot flashes    Hypertension    Iron deficiency anemia    Pneumonia    per patient "I have walking pneumonia"   Past Surgical History:  Procedure Laterality Date   COLONOSCOPY     ECTOPIC PREGNANCY SURGERY      x 2   IR IMAGING GUIDED PORT INSERTION  01/31/2022   REVERSE SHOULDER ARTHROPLASTY Right 02/02/2017   Procedure: RIGHT REVERSE SHOULDER ARTHROPLASTY;  Surgeon: Beverely Low, MD;  Location: [facility] OR;  Service: Orthopedics;  Laterality: Right;  Social History:  reports that she quit smoking about 19 years ago. Her smoking use included cigarettes. She started smoking about 49 years ago. She has a 30 pack-year smoking history. She has never used smokeless tobacco. She reports current alcohol use of about 12.0 - 14.0 standard drinks of alcohol per week. She reports that she does not use drugs.  Allergies  Allergen Reactions   Digoxin And Related Rash    Generalized total body rash with immense itching   Gluten Meal Diarrhea   Penicillins Rash and Other  (See Comments)      Did it involve swelling of the face/tongue/throat, SOB, or low BP? No Did it involve sudden or severe rash/hives, skin peeling, or any reaction on the inside of your mouth or nose? Yes Did you need to seek medical attention at a hospital or doctor's office? No When did it last happen?    "Many Years Ago"  If all above answers are "NO", may proceed with cephalosporin use.      Fexofenadine     Other Reaction(s): dry mouth   Alendronate Rash   Hydroxychloroquine Rash    Family History  Problem Relation Age of Onset   Peripheral Artery Disease Father    Diabetes Father     Prior to Admission medications   Medication Sig Start Date End Date Taking? Authorizing Provider  acetaminophen-codeine (TYLENOL #3) 300-30 MG tablet Take 1 tablet by mouth every 6 (six) hours as needed for moderate pain. 05/28/23  Yes Malachy Mood, MD  apixaban (ELIQUIS) 5 MG TABS tablet Take 1 tablet (5 mg total) by mouth 2 (two) times daily. 06/05/23  Yes [name], Deloris Ping, MD  denosumab (PROLIA) 60 MG/ML SOSY injection Inject 60 mg into the skin every 6 (six) months.   Yes [provider]  diltiazem (CARDIZEM CD) 360 MG 24 hr capsule Take 1 capsule (360 mg total) by mouth daily. 06/05/23  Yes [name], Deloris Ping, MD  furosemide (LASIX) 20 MG tablet Take 1 tablet (20 mg total) by mouth daily. 05/21/22 11/12/23 Yes Erick Blinks, MD  gabapentin (NEURONTIN) 300 MG capsule Take 300 mg by mouth in the morning. 07/21/14  Yes [provider]  Metoprolol Tartrate 75 MG TABS Take 2 tablets (150 mg total) by mouth 2 (two) times daily. 07/25/23  Yes [name], Deloris Ping, MD  oxyCODONE-acetaminophen (PERCOCET/ROXICET) 5-325 MG tablet Take 0.5-1 tablets by mouth every 4 (four) hours as needed for severe pain (pain score 7-10). 10/18/23  Yes Malachy Mood, MD  potassium chloride SA (KLOR-CON M20) 20 MEQ tablet Take 1 tablet (20 mEq total) by mouth daily. Take with furosemide 05/22/23  Yes [name], Deloris Ping, MD   predniSONE (DELTASONE) 5 MG tablet Take 5 mg by mouth daily with breakfast. 09/22/21  Yes [provider]  Tiotropium Bromide-Olodaterol (STIOLTO RESPIMAT) 2.5-2.5 MCG/ACT AERS Inhale 2 puffs into the lungs daily. 04/23/23  Yes Josephine Igo, DO    Physical Exam: Vitals:   11/12/23 2050 11/12/23 2100 11/12/23 2101 11/12/23 2104  BP: 128/81 (!) 138/126    Pulse: 73 68 71 67  Resp:  (!) 23 (!) 23 (!) 21  Temp:      TempSrc:      SpO2: 97% 98% 96% 96%   Physical Exam:  General: No acute distress, chronically ill appearing HEENT: Normocephalic, atraumatic, PERRL, injected sclera Cardiovascular: Irregular rhythm, slightly tachycardic. Distal pulses intact. Pulmonary: Normal pulmonary effort, normal breath sounds Gastrointestinal: Nondistended abdomen, soft, non-tender, normoactive bowel sounds Musculoskeletal:Normal ROM,  right hip has excellent adduction and abduction without pain.  She has no tenderness to palpation up and down her spine or in her sacral area.  No flank pain.  1+ lower ext edema Lymphadenopathy: No cervical LAD. Skin: Skin multiple healed bruises and discolorations and abrasions on all extremities Neuro: No focal deficits noted, generalized weakness, AAOx3. PSYCH: Attentive and cooperative  Data Reviewed:  Results for orders placed or performed during the hospital encounter of 11/12/23 (from the past 24 hour(s))  Urinalysis, Routine w reflex microscopic -Urine, Clean Catch     Status: Abnormal   Collection Time: 11/12/23  3:25 PM  Result Value Ref Range   Color, Urine YELLOW YELLOW   APPearance CLEAR CLEAR   Specific Gravity, Urine 1.012 1.005 - 1.030   pH 6.0 5.0 - 8.0   Glucose, UA NEGATIVE NEGATIVE mg/dL   Hgb urine dipstick NEGATIVE NEGATIVE   Bilirubin Urine NEGATIVE NEGATIVE   Ketones, ur 5 (A) NEGATIVE mg/dL   Protein, ur NEGATIVE NEGATIVE mg/dL   Nitrite NEGATIVE NEGATIVE   Leukocytes,Ua NEGATIVE NEGATIVE  CBC     Status: Abnormal    Collection Time: 11/12/23  3:42 PM  Result Value Ref Range   WBC 11.6 (H) 4.0 - 10.5 K/uL   RBC 3.60 (L) 3.87 - 5.11 MIL/uL   Hemoglobin 12.2 12.0 - 15.0 g/dL   HCT 09.6 04.5 - 40.9 %   MCV 106.4 (H) 80.0 - 100.0 fL   MCH 33.9 26.0 - 34.0 pg   MCHC 31.9 30.0 - 36.0 g/dL   RDW 81.1 91.4 - 78.2 %   Platelets 230 150 - 400 K/uL   nRBC 0.0 0.0 - 0.2 %  Troponin I (High Sensitivity)     Status: None   Collection Time: 11/12/23  3:42 PM  Result Value Ref Range   Troponin I (High Sensitivity) 5 <18 ng/L  Comprehensive metabolic panel     Status: Abnormal   Collection Time: 11/12/23  3:42 PM  Result Value Ref Range   Sodium 136 135 - 145 mmol/L   Potassium 3.8 3.5 - 5.1 mmol/L   Chloride 99 98 - 111 mmol/L   CO2 21 (L) 22 - 32 mmol/L   Glucose, Bld 119 (H) 70 - 99 mg/dL   BUN 23 8 - 23 mg/dL   Creatinine, Ser 9.56 0.44 - 1.00 mg/dL   Calcium 9.3 8.9 - 21.3 mg/dL   Total Protein 7.2 6.5 - 8.1 g/dL   Albumin 4.1 3.5 - 5.0 g/dL   AST 24 15 - 41 U/L   ALT 12 0 - 44 U/L   Alkaline Phosphatase 110 38 - 126 U/L   Total Bilirubin 1.1 <1.2 mg/dL   GFR, Estimated 60 (L) >60 mL/min   Anion gap 16 (H) 5 - 15  Troponin I (High Sensitivity)     Status: None   Collection Time: 11/12/23  5:28 PM  Result Value Ref Range   Troponin I (High Sensitivity) 5 <18 ng/L     Assessment and Plan: A-fib with RVR-patient is currently requiring a Cardizem drip which we will continue.  Will also continue her oral outpatient Lopressor dose.  Wean as tolerated Dr. Melburn Popper [facility] as her outpatient cardiologist. Keep potassium equal to 4 and magnesium at least 2.  2.  Right side pain. -The cause of this is not clear.  Her CT scan today shows some dilated loops of bowel but no bony abnormalities other than the chronic presacral mass. Her MRI from  1 month ago showed sacral and pelvic fractures but her pain is not in that area and the pain is new .  Consider repeat MRI in the morning if pain is not improved  after bowel movement.    3.  Continue gluten-free diet  4. CML - at baseline    Advance Care Planning:   Code Status: Full Code the patient names her husband as her surrogate decision-maker and she wants to be full code.  Consults: none  Family Communication: The patient's husband was at bedside  Severity of Illness: The appropriate patient status for this patient is INPATIENT. Inpatient status is judged to be reasonable and necessary in order to provide the required intensity of service to ensure the patient's safety. The patient's presenting symptoms, physical exam findings, and initial radiographic and laboratory data in the context of their chronic comorbidities is felt to place them at high risk for further clinical deterioration. Furthermore, it is not anticipated that the patient will be medically stable for discharge from the hospital within 2 midnights of admission.   * I certify that at the point of admission it is my clinical judgment that the patient will require inpatient hospital care spanning beyond 2 midnights from the point of admission due to high intensity of service, high risk for further deterioration and high frequency of surveillance required.*  Author: Buena Irish, MD 11/12/2023 9:34 PM  For on call review [email or web address].

## 2023-11-12 NOTE — Progress Notes (Addendum)
Symptom Management Consult Note [facility]    Patient Care Team: [name], MD as PCP - General (Internal Medicine) [name], Deloris Ping, MD as PCP - Cardiology (Cardiology) Zenovia Jordan, MD as Consulting Physician (Rheumatology) Malachy Mood, MD as Consulting Physician (Hematology) Charna Elizabeth, MD as Consulting Physician (Gastroenterology) Josephine Igo, DO as Consulting Physician (Pulmonary Disease)    Name / MRN / DOB: [name]  [id number]  06/26/1946   Date of visit: 11/12/2023   Chief Complaint/Reason for visit: back pain   Current Therapy: Azacitidine every 6 weeks  Last treatment:  Day 5   Cycle 11 on 10/12/23   ASSESSMENT & PLAN: Patient is a 77 y.o. 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  I have viewed most recent oncology note and lab work.    #Chronic myelomonocytic leukemia  - Next appointment with oncologist is 11/19/23  #Atrial Fibrillation -History of afib, on Cardizem and patient reports compliance. -On arrival to clinic patient with tachycardic with current heart rate in the 140s. Rhythm is irregular by radial pulse and auscultation. HR ranging from 47-141 on the pulse ox. She is in pain and unable to sit in exam chair for an EKG to be performed, prefers to stay seated in her wheelchair. No change in HR with vagal maneuver. Concern for afib with RVR. - Patient denies chest pain or shortness of breath. She is surprised HR is elevated. -Discussed risks of prolonged elevated heart rate and need for rate control in the ER.  #Back Pain -Chart review shows MR Pelvis from 10/12/23 shows presacral mass measuring 10.2 x 5.5 x 7.2 cm, with fatty elements and various regions of internal enhancement. Possibilities include extramedullary hematopoiesis, myelolipoma, teratoma, or liposarcoma. No sacral plexus impingement. Bilateral vertical sacral ala fractures. Abnormal edema in both pubic bones  potentially from osteitis pubis or stress injuries. There is also a band of edema and enhancement anteriorly in the right inferior pubic ramus which may represent nondisplaced fracture or stress injury. Dr. Mosetta Putt discussed results with ortho who recommends neurosurgery evaluation.  -Today patient is here for acute onset of severe, constant, sharp throbbing pain in the side, rated 7/10, not relieved by oxycodone with Tylenol. No history of trauma or urinary symptoms.  Discussed concern of uncontrollable pain with pain medications. -Abdominal exam is benign. No reproducible back pain on exam. No saddle anesthesia. -Discussed with patient plan would be to do outpatient CT AP for further evaluation of her pain however with her tachycardia she will need higher level of care. Port has been accessed and basic labs were collected prior to ED transfer. Results still in process. -Will defer to ED provider for work up of acute pain. Report given to accepting RN.    Heme/Onc History: Oncology History Overview Note   Cancer Staging  CMML (chronic myelomonocytic leukemia) (HCC) Staging form: Chronic Myeloid Leukemia, AJCC 8th Edition - Clinical stage from 01/12/2022: Bone marrow blast count (%): 12, Additional clonal changes: Unknown - Signed by Malachy Mood, MD on 01/12/2022 Stage prefix: Initial diagnosis     CMML (chronic myelomonocytic leukemia) (HCC)  11/09/2021 Initial Biopsy   DIAGNOSIS:   -  Monoclonal B-cell population with co-expression of CD5 comprises 17%  of all lymphocytes  -  See comment   COMMENT:  In addition to the clonal B-cell population, there is a myeloblast  population (CD34, CD38, HLA-DR, CD117, CD123 and CD33) that comprises 2% of the total cellular  events.  Please see concurrent tissue biopsy (below) for additional work-up and final diagnosis.    FINAL MICROSCOPIC DIAGNOSIS:   A. SOFT TISSUE MASS, PRE SACRAL, NEEDLE CORE BIOPSY:  -  Chronic lymphocytic leukemia/small  lymphocytic lymphoma  -  Extra medullary hematopoiesis  -  See comment   COMMENT:  The biopsy consists of multiple soft tissue cores with lymphoid nodules and a dense hematopoietic infiltrate consistent with extra medullary hematopoiesis.  MPO and E-cadherin highlight myeloid and erythroid precursors respectively.  CD34 highlights increased vasculature and is also positive within the cytoplasm of megakaryocytes.  A few small, immature mononuclear cells appear to be positive for CD34 and CD117. TdT shows rare, scattered positive cells.  CD20 highlights aggregates of B cells which are admixed with CD3 positive T cells.  T cells are an admixture of CD4 and CD8.  The B cells are also positive for CD5, CD23 and Bcl-2.  The B cells do not show significant staining for CD10, BCL6 or cyclin D1.  CD138 highlights scattered plasma cells which are polytypic by kappa and lambda in situ hybridization.  Flow cytometry performed on the sample (see WL [id number]) identified a kappa restricted CD5 positive B-cell population comprising 70% of lymphocytes.  In addition, a small myeloblast population comprised 2% of the total cellular events.   Overall, the findings are consistent with soft tissue involvement by  chronic lymphocytic leukemia/small lymphocytic lymphoma and extra medullary hematopoiesis. In reviewing the patient's CBC data (macrocytic anemia and thrombocytopenia), I would recommend a bone marrow biopsy to assess for marrow involvement by CLL/SLL.    12/23/2021 Imaging   EXAM: CT CHEST, ABDOMEN, AND PELVIS WITH CONTRAST  IMPRESSION: 1. Slight interval enlargement of a presacral soft tissue mass measuring 7.2 x 4.7 cm, previously 6.9 x 4.1 cm on prior MR of the pelvis dated 07/04/2021. By report, this represents a biopsy proven lymphoma. 2. Pleural nodule of the dependent right lower lobe overlying the posterior right tenth rib and pleural or paraspinous soft tissue mass overlying the right aspect of  the T10 vertebral body, very slightly enlarged compared to prior examination of the chest dated 06/25/2020, consistent with additional sites of lymphomatous involvement given very indolent growth. These could be better assessed for metabolic activity by FDG PET/CT if desired. 3. There is mild, bibasilar predominant pulmonary fibrosis in a pattern featuring irregular peripheral interstitial opacity, septal thickening, but without clear evidence of subpleural bronchiolectasis or honeycombing, with a somewhat asymmetric distribution most conspicuously involving the right lower lobe and lingula. These findings are significantly worsened when compared to prior examination dated 06/25/2020, particularly in the right lower lobe. Given interval change, this may reflect sequelae of interval infection or aspiration, however appearance is generally suspicious for fibrotic interstitial lung disease, and if characterized by ATS pulmonary fibrosis is in an "indeterminate for UIP" pattern, differential considerations including both UIP and NSIP. 4. Emphysema.   Aortic Atherosclerosis (ICD10-I70.0) and Emphysema (ICD10-J43.9).   01/05/2022 Pathology Results   DIAGNOSIS:   BONE MARROW, ASPIRATE, CLOT, CORE:  -Hypercellular bone marrow for age with features of  myelodysplastic/myeloproliferative neoplasm  -Minor abnormal B-cell population  -See comment   PERIPHERAL BLOOD:  -Macrocytic anemia  -Neutrophilic left shift and monocytosis  -Thrombocytopenia   COMMENT:  The bone marrow is hypercellular for age with dyspoietic changes  involving myeloid cell lines associated with monocytosis and increased number of blastic cells (12%) as primarily seen by morphology, many of which display monocytic features.  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  In this background, there are several predominantly small lymphoid aggregates mostly composed of small lymphoid cells.  By flow cytometry, a minor abnormal B-cell population expressing CD5 is seen and representing 2% of all cells.  This correlate with previously known B-cell lymphoproliferative process.  Correlation with cytogenetic and FISH studies is strongly recommended.    DIAGNOSIS:   -Increased number of monocytic cells present (25%)  -Minor abnormal B-cell population identified.  -See comment   COMMENT:  Flow cytometric analysis shows increased number of monocytic cells representing 25% of all cells but without aberrant phenotype or CD34 expression.  A significant CD34-positive blastic population is not identified.  The lymphoid population shows a minor B-cell population representing 2% of all cells and expressing B-cell antigens including CD20 associated with CD5, CD200 and possibly dim kappa expression.  The latter findings are abnormal and correlate with previously known B-cell lymphoproliferative process.  No significant T-cell phenotypic abnormalities identified.    01/12/2022 Initial Diagnosis   CMML (chronic myelomonocytic leukemia) (HCC)   01/12/2022 Cancer Staging   Staging form: Chronic Myeloid Leukemia, AJCC 8th Edition - Clinical stage from 01/12/2022: Bone marrow blast count (%): 12, Additional clonal changes: Unknown - Signed by Malachy Mood, MD on 01/12/2022 Stage prefix: Initial diagnosis   02/06/2022 - 08/11/2022 Chemotherapy   Patient is on Treatment Plan : MYELODYSPLASIA  Azacitidine IV D1-7 q28d     02/06/2022 -  Chemotherapy   Patient is on Treatment Plan : MYELODYSPLASIA  Azacitidine IV D1-7 q28d     06/03/2023 Imaging   MR lumbar spine with and without contrast IMPRESSION: 1. Long-standing presacral mass, at least doubled in size since 2016, now at least 8 cm in length. There is internal fat components and differential considerations include  myelolipoma, liposarcoma, and teratoma. 2. 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 3. L2-3 left paracentral extrusion compressing the left L3 nerve root. 4. Right foraminal impingement at L3-4 to L4-5, greatest at L4-5. Right subarticular recess narrowing at L3-4 and L4-5.   07/09/2023 Imaging   CT abdomen and pelvis with contrast  IMPRESSION: No acute findings in the abdomen or pelvis. Specifically, no findings to explain the patient's history of pain. 2. Interval progression of presacral and right thoracic paraspinal soft tissue lesions compatible with mild progression of disease since 06/19/2022. Small posterior right thoracic chest wall lesion also mildly progressive. 3. Mild circumferential wall thickening distal esophagus. Esophagitis would be a consideration. 4.  Aortic Atherosclerosis (ICD10-I70.0).       Interval history-: Discussed the use of AI scribe software for clinical note transcription with the patient, who gave verbal consent to proceed.   [name] is a 77 y.o. female with oncologic history as above presenting to [facility] today with chief complaint of back pain. Patient is accompanied by spouse who provides addition history.  Patient reports a new onset of severe, constant, sharp, right-sided pain that started approximately five to six days ago. The pain, initially thought to be related to the spinal fracture, is now believed by the patient to be a separate issue. The pain has been progressively worsening and is currently rated as a 7 out of 10 on the pain scale. The patient denies any precipitating events such as a fall or heavy lifting. The pain is described as a sharp throbbing, akin to a severe headache.  The patient has been  managing the pain with a prescription of oxycodone with Tylenol, usually taken once in the morning, with additional Tylenol as needed later in the day. However, in the past few days, the pain has not been  adequately controlled with this regimen. The patient reports no change in pain level even after taking the medication. Despite the pain, the patient is still able to walk and denies any numbness or tingling in the legs or any changes in bowel habits.  The patient also has a history of atrial fibrillation and is on medication for heart rate control. The patient reports compliance with all prescribed medications. The patient denies any shortness of breath or other symptoms suggestive of cardiac issues. The patient's heart rate was noted to be elevated upon clinic arrival to 139.   ROS  All other systems are reviewed and are negative for acute change except as noted in the HPI.    Allergies  Allergen Reactions   Digoxin And Related Rash    Generalized total body rash with immense itching   Gluten Meal Diarrhea   Penicillins Rash and Other (See Comments)      Did it involve swelling of the face/tongue/throat, SOB, or low BP? No Did it involve sudden or severe rash/hives, skin peeling, or any reaction on the inside of your mouth or nose? Yes Did you need to seek medical attention at a hospital or doctor's office? No When did it last happen?    "Many Years Ago"  If all above answers are "NO", may proceed with cephalosporin use.      Fexofenadine     Other Reaction(s): dry mouth   Alendronate Rash   Hydroxychloroquine Rash     Past Medical History:  Diagnosis Date   Arthritis    Rheumatoid arthritis   Celiac disease    Chronic kidney disease    stage 3 from MD notes   COPD (chronic obstructive pulmonary disease) (HCC)    Dyspnea    with going up stairs   Family history of adverse reaction to anesthesia    father had hard time waking up   Headache    sinus headaches   Hot flashes    Hypertension    Iron deficiency anemia    Pneumonia    per patient "I have walking pneumonia"     Past Surgical History:  Procedure Laterality Date   COLONOSCOPY     ECTOPIC PREGNANCY  SURGERY      x 2   IR IMAGING GUIDED PORT INSERTION  01/31/2022   REVERSE SHOULDER ARTHROPLASTY Right 02/02/2017   Procedure: RIGHT REVERSE SHOULDER ARTHROPLASTY;  Surgeon: Beverely Low, MD;  Location: [facility] OR;  Service: Orthopedics;  Laterality: Right;    Social History   Socioeconomic History   Marital status: Married    Spouse name: Not on file   Number of children: Not on file   Years of education: Not on file   Highest education level: Not on file  Occupational History   Not on file  Tobacco Use   Smoking status: Former    Current packs/day: 0.00    Average packs/day: 1 pack/day for 30.0 years (30.0 ttl pk-yrs)    Types: Cigarettes    Start date: 63    Quit date: 2005    Years since quitting: 19.8   Smokeless tobacco: Never  Vaping Use   Vaping status: Never Used  Substance and Sexual Activity   Alcohol use: Yes    Alcohol/week: 12.0 - 14.0 standard  drinks of alcohol    Types: 12 - 14 Glasses of wine per week   Drug use: No   Sexual activity: Not Currently  Other Topics Concern   Not on file  Social History Narrative   Not on file   Social Determinants of Health   Financial Resource Strain: Not on file  Food Insecurity: No Food Insecurity (06/08/2023)   Hunger Vital Sign    Worried About Running Out of Food in the Last Year: Never true    Ran Out of Food in the Last Year: Never true  Transportation Needs: No Transportation Needs (06/08/2023)   PRAPARE - Administrator, Civil Service (Medical): No    Lack of Transportation (Non-Medical): No  Physical Activity: Not on file  Stress: Not on file  Social Connections: Not on file  Intimate Partner Violence: Not At Risk (06/08/2023)   Humiliation, Afraid, Rape, and Kick questionnaire    Fear of Current or Ex-Partner: No    Emotionally Abused: No    Physically Abused: No    Sexually Abused: No    Family History  Problem Relation Age of Onset   Peripheral Artery Disease Father    Diabetes Father       Current Outpatient Medications:    acetaminophen-codeine (TYLENOL #3) 300-30 MG tablet, Take 1 tablet by mouth every 6 (six) hours as needed for moderate pain., Disp: 20 tablet, Rfl: 0   apixaban (ELIQUIS) 5 MG TABS tablet, Take 1 tablet (5 mg total) by mouth 2 (two) times daily., Disp: 60 tablet, Rfl: 11   denosumab (PROLIA) 60 MG/ML SOSY injection, Inject 60 mg into the skin every 6 (six) months., Disp: , Rfl:    diltiazem (CARDIZEM CD) 360 MG 24 hr capsule, Take 1 capsule (360 mg total) by mouth daily., Disp: 90 capsule, Rfl: 3   gabapentin (NEURONTIN) 300 MG capsule, Take 300 mg by mouth in the morning., Disp: , Rfl:    Metoprolol Tartrate 75 MG TABS, Take 2 tablets (150 mg total) by mouth 2 (two) times daily., Disp: 360 tablet, Rfl: 2   oxyCODONE (ROXICODONE) 5 MG immediate release tablet, Take 0.5-1 tablets (2.5-5 mg total) by mouth every 4 (four) hours as needed for severe pain (pain score 7-10)., Disp: 30 tablet, Rfl: 0   oxyCODONE-acetaminophen (PERCOCET/ROXICET) 5-325 MG tablet, Take 0.5-1 tablets by mouth every 4 (four) hours as needed for severe pain (pain score 7-10)., Disp: 60 tablet, Rfl: 0   pantoprazole (PROTONIX) 20 MG tablet, Take 1 tablet (20 mg total) by mouth daily. For three weeks, Disp: 21 tablet, Rfl: 0   potassium chloride SA (KLOR-CON M20) 20 MEQ tablet, Take 1 tablet (20 mEq total) by mouth daily. Take with furosemide, Disp: 90 tablet, Rfl: 3   predniSONE (DELTASONE) 5 MG tablet, Take 5 mg by mouth daily with breakfast., Disp: , Rfl:    Tiotropium Bromide-Olodaterol (STIOLTO RESPIMAT) 2.5-2.5 MCG/ACT AERS, Inhale 2 puffs into the lungs daily., Disp: 4 g, Rfl: 11   traMADol (ULTRAM) 50 MG tablet, Take 1-2 tablets (50-100 mg total) by mouth every 6 (six) hours as needed., Disp: 15 tablet, Rfl: 0   furosemide (LASIX) 20 MG tablet, Take 1 tablet (20 mg total) by mouth daily., Disp: 30 tablet, Rfl: 11 No current facility-administered medications for this  visit.  Facility-Administered Medications Ordered in Other Visits:    acetaminophen (TYLENOL) 325 MG tablet, , , ,    diphenhydrAMINE (BENADRYL) 25 mg capsule, , , ,  sodium chloride flush (NS) 0.9 % injection 10 mL, 10 mL, Intracatheter, Once, Malachy Mood, MD  PHYSICAL EXAM: ECOG FS:2 - Symptomatic, <50% confined to bed    Vitals:   11/12/23 1345  BP: 127/78  Pulse: (!) 139  Resp: 15  Temp: 99.1 F (37.3 C)  TempSrc: Temporal  SpO2: 100%  Weight: 103 lb (46.7 kg)  Height: 5\' 3"  (1.6 m)   Physical Exam Vitals and nursing note reviewed.  Constitutional:      Appearance: She is not ill-appearing or toxic-appearing.  HENT:     Head: Normocephalic.  Eyes:     Conjunctiva/sclera: Conjunctivae normal.  Cardiovascular:     Rate and Rhythm: Tachycardia present. Rhythm irregular.     Pulses: Normal pulses.     Heart sounds: Normal heart sounds.  Pulmonary:     Effort: Pulmonary effort is normal.     Breath sounds: Normal breath sounds.  Abdominal:     General: There is no distension.     Palpations: Abdomen is soft.     Tenderness: There is no abdominal tenderness. There is no guarding.  Musculoskeletal:     Cervical back: Normal range of motion.     Comments: Full range of motion of the T-spine and L-spineNo tenderness to palpation of the spinous processes of the T-spine or L-spine No crepitus, deformity or step-offs No tenderness to palpation of the paraspinous muscles of the L-spine  Pain is not reproducible.  Full ROM of BLE.   Skin:    General: Skin is warm and dry.  Neurological:     Mental Status: She is alert.        LABORATORY DATA: I have reviewed the data as listed    Latest Ref Rng & Units 10/08/2023    1:21 PM 10/04/2023   11:14 AM 09/19/2023    1:38 PM  CBC  WBC 4.0 - 10.5 K/uL 15.7  15.2  5.2   Hemoglobin 12.0 - 15.0 g/dL 9.4  16.1  9.8   Hematocrit 36.0 - 46.0 % 29.3  33.6  30.6   Platelets 150 - 400 K/uL 186  168  75         Latest  Ref Rng & Units 10/08/2023    1:21 PM 10/04/2023   11:14 AM 09/19/2023    1:38 PM  CMP  Glucose 70 - 99 mg/dL 90  096  045   BUN 8 - 23 mg/dL 23  24  17    Creatinine 0.44 - 1.00 mg/dL 4.09  8.11  9.14   Sodium 135 - 145 mmol/L 137  136  139   Potassium 3.5 - 5.1 mmol/L 4.0  4.2  4.3   Chloride 98 - 111 mmol/L 103  102  103   CO2 22 - 32 mmol/L 27  27  28    Calcium 8.9 - 10.3 mg/dL 9.6  9.9  9.8   Total Protein 6.5 - 8.1 g/dL  6.5  6.4   Total Bilirubin 0.3 - 1.2 mg/dL  0.8  0.8   Alkaline Phos 38 - 126 U/L  63  70   AST 15 - 41 U/L  18  18   ALT 0 - 44 U/L  19  15        RADIOGRAPHIC STUDIES (from last 24 hours if applicable) I have personally reviewed the radiological images as listed and agreed with the findings in the report. No results found.      Visit Diagnosis: 1. Chronic myelomonocytic leukemia  not having achieved remission (HCC)   2. Tachycardia   3. Port-A-Cath in place      No orders of the defined types were placed in this encounter.   All questions were answered. The patient knows to call the clinic with any problems, questions or concerns. No barriers to learning was detected.  A total of more than 40 minutes were spent on this encounter with face-to-face time and non-face-to-face time, including preparing to see the patient, ordering tests and/or medications, counseling the patient and coordination of care as outlined above.    Thank you for allowing me to participate in the care of this patient.    Shanon Ace, PA-C Department of Hematology/Oncology [facility] at [facility] Phone: [phone]  [phone]    11/12/2023 2:28 PM

## 2023-11-12 NOTE — ED Provider Notes (Signed)
[place] EMERGENCY DEPARTMENT AT [facility] Provider Note   CSN: [id number] Arrival date & time: 11/12/23  1447     History  Chief Complaint  Patient presents with   Joint Pain    [name] is a 77 y.o. female.  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  Pt has been having severe pain in her left side and back for the past 5 days.  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  She said the pain is different than what she's been experiencing.  She has been taking oxycodone, but that has not been helping much.  She went to oncology today and was found to be in afib with rvr.  She was sent down here after that was noted.  Pt has been compliant with her eliquis and other meds.  She can't tell when her HR is elevated.  Per last cards note, pt is usually in afib.        Home Medications Prior to Admission medications   Medication Sig Start Date End Date Taking? Authorizing Provider  acetaminophen-codeine (TYLENOL #3) 300-30 MG tablet Take 1 tablet by mouth every 6 (six) hours as needed for moderate pain. 05/28/23  Yes Malachy Mood, MD  apixaban (ELIQUIS) 5 MG TABS tablet Take 1 tablet (5 mg total) by mouth 2 (two) times daily. 06/05/23  Yes [name], Deloris Ping, MD  denosumab (PROLIA) 60 MG/ML SOSY injection Inject 60 mg into the skin every 6 (six) months.   Yes [provider]  diltiazem (CARDIZEM CD) 360 MG 24 hr capsule Take 1 capsule (360 mg total) by mouth daily. 06/05/23  Yes [name], Deloris Ping, MD  furosemide (LASIX) 20 MG tablet Take 1 tablet (20 mg total) by mouth daily. 05/21/22 11/12/23 Yes Erick Blinks, MD  gabapentin (NEURONTIN) 300 MG capsule Take 300 mg by mouth in the morning. 07/21/14  Yes [provider]  Metoprolol Tartrate 75 MG TABS Take 2 tablets (150 mg total) by mouth 2 (two) times daily. 07/25/23  Yes [name],  Deloris Ping, MD  oxyCODONE-acetaminophen (PERCOCET/ROXICET) 5-325 MG tablet Take 0.5-1 tablets by mouth every 4 (four) hours as needed for severe pain (pain score 7-10). 10/18/23  Yes Malachy Mood, MD  potassium chloride SA (KLOR-CON M20) 20 MEQ tablet Take 1 tablet (20 mEq total) by mouth daily. Take with furosemide 05/22/23  Yes [name], Deloris Ping, MD  predniSONE (DELTASONE) 5 MG tablet Take 5 mg by mouth daily with breakfast. 09/22/21  Yes [provider]  Tiotropium Bromide-Olodaterol (STIOLTO RESPIMAT) 2.5-2.5 MCG/ACT AERS Inhale 2 puffs into the lungs daily. 04/23/23  Yes Icard, Elige Radon L, DO      Allergies    Digoxin and related, Gluten meal, Penicillins, Fexofenadine, Alendronate, and Hydroxychloroquine    Review of Systems   Review of Systems  Musculoskeletal:  Positive for back pain.  All other systems reviewed and are negative.   Physical Exam Updated Vital Signs BP 106/75   Pulse 96   Temp 97.7 F (36.5 C) (Oral)   Resp 17   SpO2 94%  Physical Exam Vitals and nursing note reviewed.  Constitutional:      Appearance: Normal appearance.  HENT:     Head: Normocephalic and atraumatic.     Right Ear: External ear normal.     Left Ear: External ear  normal.     Nose: Nose normal.     Mouth/Throat:     Mouth: Mucous membranes are dry.  Eyes:     Extraocular Movements: Extraocular movements intact.     Conjunctiva/sclera: Conjunctivae normal.     Pupils: Pupils are equal, round, and reactive to light.  Cardiovascular:     Rate and Rhythm: Tachycardia present. Rhythm irregular.     Pulses: Normal pulses.     Heart sounds: Normal heart sounds.  Pulmonary:     Effort: Pulmonary effort is normal.     Breath sounds: Normal breath sounds.  Abdominal:     General: Abdomen is flat. Bowel sounds are normal.     Palpations: Abdomen is soft.  Musculoskeletal:        General: Normal range of motion.     Cervical back: Normal range of motion and neck supple.  Skin:    General:  Skin is warm.     Capillary Refill: Capillary refill takes less than 2 seconds.  Neurological:     General: No focal deficit present.     Mental Status: She is alert and oriented to person, place, and time.  Psychiatric:        Mood and Affect: Mood normal.        Behavior: Behavior normal.        Thought Content: Thought content normal.        Judgment: Judgment normal.     ED Results / Procedures / Treatments   Labs (all labs ordered are listed, but only abnormal results are displayed) Labs Reviewed  CBC - Abnormal; Notable for the following components:      Result Value   WBC 11.6 (*)    RBC 3.60 (*)    MCV 106.4 (*)    All other components within normal limits  COMPREHENSIVE METABOLIC PANEL - Abnormal; Notable for the following components:   CO2 21 (*)    Glucose, Bld 119 (*)    GFR, Estimated 60 (*)    Anion gap 16 (*)    All other components within normal limits  URINALYSIS, ROUTINE W REFLEX MICROSCOPIC - Abnormal; Notable for the following components:   Ketones, ur 5 (*)    All other components within normal limits  TROPONIN I (HIGH SENSITIVITY)  TROPONIN I (HIGH SENSITIVITY)    EKG None  Radiology CT L-SPINE NO CHARGE  Result Date: 11/12/2023 CLINICAL DATA:  New left-sided back pain EXAM: CT LUMBAR SPINE WITHOUT CONTRAST TECHNIQUE: Multidetector CT imaging of the lumbar spine was performed without intravenous contrast administration. Multiplanar CT image reconstructions were also generated. RADIATION DOSE REDUCTION: This exam was performed according to the departmental dose-optimization program which includes automated exposure control, adjustment of the mA and/or kV according to patient size and/or use of iterative reconstruction technique. COMPARISON:  Pelvis MR 10/12/23 FINDINGS: Segmentation: 5 lumbar type vertebrae. Alignment: Trace retrolisthesis of L1 on L2. There is right lateral listhesis of L3 on L4. Vertebrae: There is mild asymmetric height loss of the  superior endplate of T12 (series 5, image 58). Redemonstrated heterogeneous appearance of the sacrum, in keeping with previously seen on bilateral sacral insufficiency fractures. Paraspinal and other soft tissues: Aortic atherosclerotic calcifications. Redemonstrated is a partially imaged presacral mass. Diverticulosis without diverticulitis. Disc levels: There is likely moderate to severe spinal canal narrowing at L3-L4. Severe right-sided neural foraminal stenosis at L4-L5 and L5-S1 severe left-sided neural foraminal stenosis at L5-S1 IMPRESSION: 1. Mild asymmetric height loss of the superior endplate  of T12, which is age indeterminate. Correlate with point tenderness. 2. Redemonstrated heterogeneous appearance of the sacrum, in keeping with previously seen bilateral sacral insufficiency fractures. 3. Redemonstrated partially imaged presacral mass. See same day CT abdomen/pelvis for further characterization. 4. Likely moderate to severe spinal canal narrowing at L3-L4. 5. Severe right-sided neural foraminal stenosis at L4-L5 and L5-S1 and severe left-sided neural foraminal stenosis at L5-S1. Aortic Atherosclerosis (ICD10-I70.0). Electronically Signed   By: Lorenza Cambridge M.D.   On: 11/12/2023 18:22    Procedures Procedures    Medications Ordered in ED Medications  diltiazem (CARDIZEM) 1 mg/mL load via infusion 20 mg (20 mg Intravenous Bolus from Bag 11/12/23 1708)    And  diltiazem (CARDIZEM) 125 mg in dextrose 5% 125 mL (1 mg/mL) infusion (5 mg/hr Intravenous New Bag/Given 11/12/23 1707)  ondansetron (ZOFRAN) injection 4 mg (4 mg Intravenous Given 11/12/23 1523)  morphine (PF) 4 MG/ML injection 4 mg (4 mg Intravenous Given 11/12/23 1532)  sodium chloride 0.9 % bolus 500 mL (0 mLs Intravenous Stopped 11/12/23 1716)  metoprolol tartrate (LOPRESSOR) injection 5 mg (5 mg Intravenous Given 11/12/23 1525)  sodium chloride 0.9 % bolus 500 mL (500 mLs Intravenous New Bag/Given 11/12/23 1708)  iohexol  (OMNIPAQUE) 300 MG/ML solution 100 mL (100 mLs Intravenous Contrast Given 11/12/23 1641)  HYDROmorphone (DILAUDID) injection 1 mg (1 mg Intravenous Given 11/12/23 1710)    ED Course/ Medical Decision Making/ A&P                                 Medical Decision Making Amount and/or Complexity of Data Reviewed Labs: ordered. Radiology: ordered.  Risk Prescription drug management. Decision regarding hospitalization.   This patient presents to the ED for concern of pain, palp, this involves an extensive number of treatment options, and is a complaint that carries with it a high risk of complications and morbidity.  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  The pertinent results include:  ua + ketones, cbc nl, cmp nl, trop nl   Imaging Studies ordered:  I ordered imaging studies including cxr, ct abd/pelvis, l-spine  I independently visualized and interpreted imaging which showed  CT lumbar spine: Mild asymmetric height loss of the superior endplate of T12,  which is age indeterminate. Correlate with point tenderness.  2. Redemonstrated heterogeneous appearance of the sacrum, in keeping  with previously seen bilateral sacral insufficiency fractures.  3. Redemonstrated partially imaged presacral mass. See same day CT  abdomen/pelvis for further characterization.  4. Likely moderate to severe spinal canal narrowing at L3-L4.  5. Severe right-sided neural foraminal stenosis at L4-L5 and L5-S1  and severe left-sided neural foraminal stenosis at L5-S1.    Aortic Atherosclerosis (ICD10-I70.0).  CT  abd/pelvis: Stable soft tissue mass lesions in the presacral region as well as  along the posterior right pleural margin and right paraspinal space.  These findings are similar to that seen on prior examination  consistent with the given clinical history.    Diverticulosis without diverticulitis.   I agree with the radiologist interpretation   Cardiac Monitoring:  The  patient was maintained on a cardiac monitor.  I personally viewed and interpreted the cardiac monitored which showed an underlying rhythm of: afib with rvr   Medicines ordered and prescription drug management:  I ordered medication including morphine/zofran and lopressor  for sx  Reevaluation of the patient after these medicines showed that the patient improved I have reviewed the patients home medicines and have made adjustments as needed   Test Considered:  ct   Critical Interventions:  cardizem   Consultations Obtained:  I requested consultation with the hospitalist (Dr. Gasper Sells) ,  and discussed lab and imaging findings as well as pertinent plan - she will admit   Problem List / ED Course:  Afib with rvr:  pt is in permanent afib, so cardioversion will not be effective.  Lopressor did not help hr, so pt was put on a cardizem drip after a bolus.  This has helped hr.  Pt is on eliquis.    CHA2DS2/VAS Stroke Risk Points  Current as of 47 minutes ago     6 >= 2 Points: High Risk  1 to 1.99 Points: Medium Risk  0 Points: Low Risk    Last Change: N/A      Details    This score determines the patient's risk of having a stroke if the  patient has atrial fibrillation.       Points Metrics  1 Has Congestive Heart Failure:  Yes    Current as of 47 minutes ago  0 Has Vascular Disease:  No    Current as of 47 minutes ago  1 Has Hypertension:  Yes    Current as of 47 minutes ago  2 Age:  16    Current as of 47 minutes ago  1 Has Diabetes:  Yes    Current as of 47 minutes ago  0 Had Stroke:  No   Had TIA:  No  Had Thromboembolism:  No    Current as of 47 minutes ago  1 Female:  Yes    Current as of 47 minutes ago     Hip and back pain:  likely due to pelvic and sacral fx.  Pt's pain is finally better after dilaudid.        Reevaluation:  After the interventions noted above, I reevaluated the patient and found that they have :improved   Social Determinants of Health:  Lives at home   Dispostion:  After consideration of the diagnostic results and the patients response to treatment, I feel that the patent would benefit from admission.  CRITICAL CARE Performed by: Jacalyn Lefevre   Total critical care time: 30 minutes  Critical care time was exclusive of separately billable procedures and treating other patients.  Critical care was necessary to treat or prevent imminent or life-threatening deterioration.  Critical care was time spent personally by me on the following activities: development of treatment plan with patient and/or surrogate as well as nursing, discussions with consultants, evaluation of patient's response to treatment, examination of patient, obtaining history from patient or surrogate, ordering and performing treatments and interventions, ordering and review of laboratory studies, ordering and review of radiographic studies, pulse oximetry and re-evaluation of patient's condition.           Final Clinical Impression(s) / ED Diagnoses Final diagnoses:  Presacral mass  Spinal stenosis of lumbar region, unspecified whether neurogenic claudication present  Atrial fibrillation with RVR (HCC)    Rx / DC Orders ED Discharge Orders  None         Jacalyn Lefevre, MD 11/12/23 2115

## 2023-11-13 ENCOUNTER — Encounter (HOSPITAL_COMMUNITY): Payer: Self-pay | Admitting: Internal Medicine

## 2023-11-13 DIAGNOSIS — E871 Hypo-osmolality and hyponatremia: Secondary | ICD-10-CM | POA: Insufficient documentation

## 2023-11-13 DIAGNOSIS — M533 Sacrococcygeal disorders, not elsewhere classified: Secondary | ICD-10-CM | POA: Insufficient documentation

## 2023-11-13 DIAGNOSIS — M48061 Spinal stenosis, lumbar region without neurogenic claudication: Secondary | ICD-10-CM | POA: Diagnosis not present

## 2023-11-13 DIAGNOSIS — R1909 Other intra-abdominal and pelvic swelling, mass and lump: Secondary | ICD-10-CM | POA: Diagnosis not present

## 2023-11-13 DIAGNOSIS — I4891 Unspecified atrial fibrillation: Secondary | ICD-10-CM

## 2023-11-13 DIAGNOSIS — C9310 Chronic myelomonocytic leukemia not having achieved remission: Secondary | ICD-10-CM | POA: Diagnosis not present

## 2023-11-13 DIAGNOSIS — D649 Anemia, unspecified: Secondary | ICD-10-CM | POA: Insufficient documentation

## 2023-11-13 LAB — CBC
HCT: 30.4 % — ABNORMAL LOW (ref 36.0–46.0)
Hemoglobin: 9.5 g/dL — ABNORMAL LOW (ref 12.0–15.0)
MCH: 33.9 pg (ref 26.0–34.0)
MCHC: 31.3 g/dL (ref 30.0–36.0)
MCV: 108.6 fL — ABNORMAL HIGH (ref 80.0–100.0)
Platelets: 163 10*3/uL (ref 150–400)
RBC: 2.8 MIL/uL — ABNORMAL LOW (ref 3.87–5.11)
RDW: 14.5 % (ref 11.5–15.5)
WBC: 10.9 10*3/uL — ABNORMAL HIGH (ref 4.0–10.5)
nRBC: 0 % (ref 0.0–0.2)

## 2023-11-13 LAB — BASIC METABOLIC PANEL
Anion gap: 7 (ref 5–15)
BUN: 25 mg/dL — ABNORMAL HIGH (ref 8–23)
CO2: 27 mmol/L (ref 22–32)
Calcium: 8.4 mg/dL — ABNORMAL LOW (ref 8.9–10.3)
Chloride: 97 mmol/L — ABNORMAL LOW (ref 98–111)
Creatinine, Ser: 0.98 mg/dL (ref 0.44–1.00)
GFR, Estimated: 60 mL/min — ABNORMAL LOW (ref >60)
Glucose, Bld: 131 mg/dL — ABNORMAL HIGH (ref 70–99)
Potassium: 3.4 mmol/L — ABNORMAL LOW (ref 3.5–5.1)
Sodium: 131 mmol/L — ABNORMAL LOW (ref 135–145)

## 2023-11-13 LAB — MAGNESIUM: Magnesium: 1.8 mg/dL (ref 1.7–2.4)

## 2023-11-13 MED ORDER — GABAPENTIN 300 MG PO CAPS
300.0000 mg | ORAL_CAPSULE | Freq: Every day | ORAL | Status: DC
  Administered 2023-11-14: 300 mg via ORAL
  Filled 2023-11-13: qty 1

## 2023-11-13 MED ORDER — HYDROMORPHONE HCL 2 MG PO TABS
2.0000 mg | ORAL_TABLET | ORAL | Status: DC | PRN
  Administered 2023-11-13 – 2023-11-14 (×4): 2 mg via ORAL
  Filled 2023-11-13 (×5): qty 1

## 2023-11-13 MED ORDER — DILTIAZEM HCL ER COATED BEADS 180 MG PO CP24
360.0000 mg | ORAL_CAPSULE | Freq: Every day | ORAL | Status: DC
  Administered 2023-11-13 – 2023-11-14 (×2): 360 mg via ORAL
  Filled 2023-11-13 (×2): qty 2

## 2023-11-13 MED ORDER — ENSURE ENLIVE PO LIQD
237.0000 mL | Freq: Two times a day (BID) | ORAL | Status: DC
  Administered 2023-11-14: 237 mL via ORAL

## 2023-11-13 MED ORDER — HYDROMORPHONE HCL 2 MG PO TABS
2.0000 mg | ORAL_TABLET | ORAL | Status: DC | PRN
  Administered 2023-11-13: 2 mg via ORAL
  Filled 2023-11-13: qty 1

## 2023-11-13 MED ORDER — POTASSIUM CHLORIDE CRYS ER 20 MEQ PO TBCR
40.0000 meq | EXTENDED_RELEASE_TABLET | Freq: Two times a day (BID) | ORAL | Status: AC
  Administered 2023-11-13 (×2): 40 meq via ORAL
  Filled 2023-11-13 (×2): qty 2

## 2023-11-13 MED ORDER — OXYCODONE-ACETAMINOPHEN 5-325 MG PO TABS
1.0000 | ORAL_TABLET | ORAL | Status: DC | PRN
  Administered 2023-11-13: 1 via ORAL
  Filled 2023-11-13: qty 1

## 2023-11-13 MED ORDER — ACETAMINOPHEN 500 MG PO TABS
1000.0000 mg | ORAL_TABLET | Freq: Three times a day (TID) | ORAL | Status: DC
  Administered 2023-11-13 – 2023-11-14 (×3): 1000 mg via ORAL
  Filled 2023-11-13 (×3): qty 2

## 2023-11-13 NOTE — Assessment & Plan Note (Addendum)
This is slowly progressive over months, now in the last week is very severe and her main reason for presentation.    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  In the last few months, she had worsening sacral pain but still functional.  CT July 2024 showed enlargement of the sacral mass but no other new findings.  Due to worsening pain, MRI obtained in Oct and showed new sacral insufficiency fractures.  Has followed up with Dr. Tad Moore, who is managing the sacral fractures expectantly; and will see him 11/20 in the afternoon.  CT on admission showed no new findings (only known presacral mass, known sacral fractures).  No new bleeding, fluid collection, displacement of fractures.  Given no change in CT, it's unclear if the worsening pain is from the mass or from fractures.  Today I discussed case with Dr. [name] --> surgical stabilization of the sacrum would likely cause more harm than benefit, he does not recommend it but will continue to follow the patient's sacral fractures serially/expectantly  Discussed ?tumor removal with General Surgery; they are in agreement with me and Oncology that surgical removal   Radiation therapy was tried already.  Azacitadine has not shrunk the tumor.  Today, patient limited in working with PT due to pain.  Percocet 5-325 and Dilaudid PO 2mg +acetaminophen did not help. - Titrate up Percocet - Appreciate palliative care help  - If Percocet 7.5-325 or 10-325 is more effective, likely d/c tomorrow wth palliative follow up  - Oncology and Palliative care will explore nerve blocks, etc. as an outpatient

## 2023-11-13 NOTE — Assessment & Plan Note (Signed)
-   Continue home Incruse

## 2023-11-13 NOTE — Assessment & Plan Note (Signed)
Chronic.  In RVR on admission, already converted. - Stop diltiazem drip - Resume home diltiazem CD 360 - Continue home metoprolol tartrate 150 BID - Continue Eliquis

## 2023-11-13 NOTE — Evaluation (Signed)
Physical Therapy Evaluation Patient Details Name: [name] MRN: [id number] DOB: 01/03/1946 Today's Date: 11/13/2023  History of Present Illness  77 yo female presents to therapy following hospital admission on 11/12/2023 due to A-fib with RVR and L side flank/back pain. Pt was OP follow up with oncologist and directed to the ED. Pt started on IV medication to address A-fib. Pt indicated L side pain ongoing for 4 wks, now pain is more intensified over the past 5 days. 10/12/2023 pelvic MRI pt was noted to have B sacral and pubic bone fx, presacral mass and lumbar scoliosis. Pt has PMH including but not limited to CML and currently undergoing chemotherapy, chronic A-fib and on anticoagulants, COPD, RA, celiac dz, CKD III, HTN, anemia, T10 paraspinal soft tissue mass, R pleural metastatic nodule and R reverse TSA.  Clinical Impression     Pt admitted with above diagnosis.  Pt currently with functional limitations due to the deficits listed below (see PT Problem List). Pt in bed when PT arrived. Spouse present. Pt indicated that she wanted an MRI. PT noted pt had MRI in October of this year. Pt and spouse indicate no known MOI for progressive L side pain. Pt and spouse indicated current medication regime is not addressing pain at this time with pt 7/10 at rest and increasing to 9/10 with gait trials. Pt is mod I for bed mobility, S for transfers with min cues and CGA progressing to close S for gait assessment with RW min cues for posture and proper distance from RW. Pt left seated in recliner and all needs in place. Pt may benefit from follow therapy in home setting at time of d/c. Pt will benefit from acute skilled PT to increase their independence and safety with mobility to allow discharge.         If plan is discharge home, recommend the following: A little help with walking and/or transfers;A little help with bathing/dressing/bathroom;Assistance with cooking/housework;Assist for  transportation;Help with stairs or ramp for entrance   Can travel by private vehicle        Equipment Recommendations None recommended by PT  Recommendations for Other Services       Functional Status Assessment Patient has had a recent decline in their functional status and demonstrates the ability to make significant improvements in function in a reasonable and predictable amount of time.     Precautions / Restrictions Precautions Precautions: Fall Restrictions Weight Bearing Restrictions: No      Mobility  Bed Mobility Overal bed mobility: Modified Independent                  Transfers Overall transfer level: Needs assistance Equipment used: Rolling walker (2 wheels) Transfers: Sit to/from Stand Sit to Stand: Supervision           General transfer comment: min cues for RW management    Ambulation/Gait Ambulation/Gait assistance: Contact guard assist Gait Distance (Feet): 100 Feet Assistive device: Rolling walker (2 wheels) Gait Pattern/deviations: Step-through pattern, Trunk flexed Gait velocity: decreased     General Gait Details: CGA to close S min cues for posture and proper distance from [employer]            Wheelchair Mobility     Tilt Bed    Modified Rankin (Stroke Patients Only)       Balance Overall balance assessment: Needs assistance (pt and spouse deny falls) Sitting-balance support: Feet supported Sitting balance-Leahy Scale: Good     Standing balance support: Bilateral  upper extremity supported, During functional activity, Reliant on assistive device for balance Standing balance-Leahy Scale: Poor                               Pertinent Vitals/Pain Pain Assessment Pain Assessment: 0-10 Pain Score: 9  Pain Location: L flank and back Pain Descriptors / Indicators: Constant, Cramping, Discomfort, Guarding Pain Intervention(s): Limited activity within patient's tolerance, Monitored during session,  Repositioned (pt indicates pain at rest 7/10 and increased to 9/10 with gait tasks pain is improved with trunk flexion when in sitting)    Home Living Family/patient expects to be discharged to:: Private residence Living Arrangements: Spouse/significant other Available Help at Discharge: Family Type of Home: House Home Access: Stairs to enter;Ramped entrance Entrance Stairs-Rails: None Entrance Stairs-Number of Steps: 3 Alternate Level Stairs-Number of Steps: stair lift, flight Home Layout: Two level;Bed/bath upstairs Home Equipment: Rolling Walker (2 wheels);Rollator (4 wheels);Shower seat Additional Comments: pt reports husband assist with step to enter home    Prior Function Prior Level of Function : Needs assist       Physical Assist : Mobility (physical) Mobility (physical): Stairs   Mobility Comments: mod I with use of rollator and for ADLs, self care tasks       Extremity/Trunk Assessment        Lower Extremity Assessment Lower Extremity Assessment: Generalized weakness    Cervical / Trunk Assessment Cervical / Trunk Assessment: Kyphotic  Communication   Communication Communication: No apparent difficulties  Cognition Arousal: Alert Behavior During Therapy: WFL for tasks assessed/performed Overall Cognitive Status: Impaired/Different from baseline Area of Impairment: Memory                     Memory: Decreased short-term memory                  General Comments      Exercises     Assessment/Plan    PT Assessment Patient needs continued PT services  PT Problem List Decreased activity tolerance;Decreased balance;Decreased mobility;Pain       PT Treatment Interventions DME instruction;Gait training;Functional mobility training;Therapeutic activities;Therapeutic exercise;Balance training;Neuromuscular re-education;Patient/family education    PT Goals (Current goals can be found in the Care Plan section)  Acute Rehab PT Goals Patient  Stated Goal: to figure out the cause of L side pain and reslove PT Goal Formulation: With patient Time For Goal Achievement: 12/03/23 Potential to Achieve Goals: Good    Frequency Min 1X/week     Co-evaluation               AM-PAC PT "6 Clicks" Mobility  Outcome Measure Help needed turning from your back to your side while in a flat bed without using bedrails?: None Help needed moving from lying on your back to sitting on the side of a flat bed without using bedrails?: None Help needed moving to and from a bed to a chair (including a wheelchair)?: A Little Help needed standing up from a chair using your arms (e.g., wheelchair or bedside chair)?: A Little Help needed to walk in hospital room?: A Little Help needed climbing 3-5 steps with a railing? : A Lot 6 Click Score: 19    End of Session Equipment Utilized During Treatment: Gait belt Activity Tolerance: Patient limited by pain Patient left: in chair;with call bell/phone within reach;with family/visitor present Nurse Communication: Mobility status PT Visit Diagnosis: Unsteadiness on feet (R26.81);Muscle weakness (generalized) (M62.81);Difficulty in walking,  not elsewhere classified (R26.2);Pain Pain - Right/Left: Left Pain - part of body:  (back, flank)    Time: 8295-6213 PT Time Calculation (min) (ACUTE ONLY): 20 min   Charges:   PT Evaluation $PT Eval Low Complexity: 1 Low   PT General Charges $$ ACUTE PT VISIT: 1 Visit         Johnny Bridge, PT Acute Rehab   Jacqualyn Posey 11/13/2023, 11:46 AM

## 2023-11-13 NOTE — Assessment & Plan Note (Signed)
Resolved with supplementation and starting spironolactone. 

## 2023-11-13 NOTE — Assessment & Plan Note (Signed)
Cr stable relative to baseline 0.9-1.1

## 2023-11-13 NOTE — TOC Initial Note (Signed)
Transition of Care [facility]) - Initial/Assessment Note    Patient Details  Name: [name] MRN: [id number] Date of Birth: 07-14-1946  Transition of Care [facility]) CM/SW Contact:    Lanier Clam, RN Phone Number: 11/13/2023, 2:39 PM  Clinical Narrative:  No preference for HHC-HHPT recc. [facility] for HHPT rep Angela aware. Has own transport home.                 Expected Discharge Plan: Home w Home Health Services Barriers to Discharge: Continued Medical Work up   Patient Goals and CMS Choice Patient states their goals for this hospitalization and ongoing recovery are:: Home     [place] ownership interest in [facility].provided to:: Patient    Expected Discharge Plan and Services   Discharge Planning Services: CM Consult Post Acute Care Choice: Home Health Living arrangements for the past 2 months: Single Family Home                           HH Arranged: PT HH Agency: [facility] Date [facility] Agency Contacted: 11/13/23 Time HH Agency Contacted: [phone] Representative spoke with at [facility] Agency: Marylene Land  Prior Living Arrangements/Services Living arrangements for the past 2 months: Single Family Home Lives with:: Spouse Patient language and need for interpreter reviewed:: Yes Do you feel safe going back to the place where you live?: Yes      Need for Family Participation in Patient Care: Yes (Comment) Care giver support system in place?: Yes (comment) Current home services: DME (rw,cane) Criminal Activity/Legal Involvement Pertinent to Current Situation/Hospitalization: No - Comment as needed  Activities of Daily Living   ADL Screening (condition at time of admission) Independently performs ADLs?: Yes (appropriate for developmental age) Is the patient deaf or have difficulty hearing?: No Does the patient have difficulty seeing, even when wearing glasses/contacts?: No Does the patient have difficulty concentrating, remembering, or making decisions?:  No  Permission Sought/Granted Permission sought to share information with : Case Manager Permission granted to share information with : Yes, Verbal Permission Granted  Share Information with NAME: Case manager           Emotional Assessment Appearance:: Appears stated age Attitude/Demeanor/Rapport: Gracious Affect (typically observed): Accepting Orientation: : Oriented to Self, Oriented to Place, Oriented to  Time Alcohol / Substance Use: Not Applicable Psych Involvement: No (comment)  Admission diagnosis:  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] Atrial fibrillation with RVR (HCC) [I48.91] Spinal stenosis of lumbar region, unspecified whether neurogenic claudication present [M48.061] Patient Active Problem List   Diagnosis Date Noted   Hyponatremia 11/13/2023   Right sided abdominal pain 11/13/2023   Normocytic anemia 11/13/2023   Celiac disease 10/05/2022   Polymyalgia rheumatica (HCC) 10/05/2022   Gastro-esophageal reflux disease without esophagitis 10/05/2022   Pure hypercholesterolemia 10/05/2022   Acute urinary retention 06/20/2022   Arterial vascular disease 06/20/2022   Atrial fibrillation with RVR (HCC) 06/19/2022   Acute respiratory failure with hypoxia (HCC) 05/19/2022   Rheumatoid arthritis (HCC) 05/19/2022   Closed fracture of metatarsal bone of left foot 04/21/2022   Hematoma of left foot 04/08/2022   Malnutrition of moderate degree 04/06/2022   Chronic systolic CHF (congestive heart failure) (HCC) 04/05/2022   Dyspnea on exertion 04/05/2022   Hypokalemia 04/05/2022   Physical deconditioning 04/05/2022   Chronic kidney disease, stage 3a (HCC) 04/05/2022   Constipation 04/05/2022   Atrial fibrillation with slow ventricular response (HCC) 03/29/2022   Multifocal pneumonia 03/29/2022   Pancytopenia (  HCC) 03/29/2022   Bilateral pleural effusion 03/29/2022   Port-A-Cath in place 02/06/2022   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   Stage 2 moderate COPD by  GOLD classification (HCC) 05/03/2021   Hx of multiple pulmonary nodules 05/03/2021   S/P shoulder replacement, 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   Thrombocytopenia (HCC) 02/04/2015   PCP:  [name], MD Pharmacy:   [facility] [phone] - [place], [place] - [address] AT [facility] OF [address] & [address] [place] [address] Phone: [phone] Fax: [phone]     Social Determinants of Health (SDOH) Social History: SDOH Screenings   Food Insecurity: No Food Insecurity (11/13/2023)  Housing: Low Risk  (11/13/2023)  Transportation Needs: No Transportation Needs (11/13/2023)  Utilities: Not At Risk (11/13/2023)  Depression (PHQ2-9): Low Risk  (06/08/2023)  Tobacco Use: Medium Risk (11/13/2023)   SDOH Interventions:     Readmission Risk Interventions    06/23/2022   11:27 AM  Readmission Risk Prevention Plan  Transportation Screening Complete  Medication Review (RN Care Manager) Complete  PCP or Specialist appointment within 3-5 days of discharge Complete  HRI or Home Care Consult Complete  SW Recovery Care/Counseling Consult Complete  Palliative Care Screening Not Applicable  Skilled Nursing Facility Not Applicable

## 2023-11-13 NOTE — Plan of Care (Signed)

## 2023-11-13 NOTE — Assessment & Plan Note (Signed)
As evidenced by moderate loss of subcutaneous muscle mass and fat. - Ensure BID

## 2023-11-13 NOTE — Assessment & Plan Note (Addendum)
Mild, asymptomatic - Continue home furosemide

## 2023-11-13 NOTE — Assessment & Plan Note (Signed)
Hgb stable relative to baseline 

## 2023-11-13 NOTE — Progress Notes (Signed)
Progress Note   Patient: [name] WGN:[id number] DOB: 11-Jul-1946 DOA: 11/12/2023     1 DOS: the patient was seen and examined on 11/13/2023 at 8:40AM and 3:45PM      Brief hospital course: 77 y.o. 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  Started on diltiazem drip and converted to sinus rhythm in the ER.    Hospitalization complicated by intractable pelvic pain.     Assessment and Plan: * Atrial fibrillation with RVR (HCC) Chronic.  In RVR on admission, already converted. - Stop diltiazem drip - Resume home diltiazem CD 360 - Continue home metoprolol tartrate 150 BID - Continue Eliquis    Sacral pain This is slowly progressive over months, now in the last week is very severe and her main reason for presentation.    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  In the last few months, she had worsening sacral pain but still functional.  CT July 2024 showed enlargement of the sacral mass but no other new findings.  Due to worsening pain, MRI obtained in Oct and showed new sacral insufficiency fractures.  Has followed up with Dr. Tad Moore, who is managing the sacral fractures expectantly; and will see him 11/20 in the afternoon.  CT on admission showed no new findings (only known presacral mass, known sacral fractures).  No new bleeding, fluid collection, displacement of fractures.  Given no change in CT, it's unclear if the worsening pain is from the mass or from fractures.  Today I discussed case with Dr. [name] --> surgical stabilization of the sacrum would likely cause more harm than benefit, he does not recommend it but will continue to follow the patient's sacral fractures serially/expectantly  Discussed ?tumor removal  with General Surgery; they are in agreement with me and Oncology that surgical removal   Radiation therapy was tried already.  Azacitadine has not shrunk the tumor.  Today, patient limited in working with PT due to pain.  Percocet 5-325 and Dilaudid PO 2mg +acetaminophen did not help. - Titrate up Percocet - Appreciate palliative care help  - If Percocet 7.5-325 or 10-325 is more effective, likely d/c tomorrow wth palliative follow up  - Oncology and Palliative care will explore nerve blocks, etc. as an outpatient    CMML (chronic myelomonocytic leukemia) (HCC) - Continue prednisone - Consult Oncology, appreciate expertise  Normocytic anemia Hgb stable relative to baseline  Hyponatremia Mild, asymptomatic - Continue home furosemide  Pure hypercholesterolemia No longer on statin  Malnutrition of moderate degree As evidenced by moderate loss of subcutaneous muscle mass and fat. - Ensure BID  Chronic kidney disease, stage 3a (HCC) Cr stable relative to baseline 0.9-1.1  Hypokalemia - Supplement K  Stage 2 moderate COPD by GOLD classification (HCC) - Continue home Incruse          Subjective: Patient still has severe pain in the left left sacral area.  She has no chest pain, palpitations, dyspnea.     Physical Exam: BP (!) 111/57 (BP Location: Left Arm)   Pulse 65   Temp 98 F (36.7 C) (Oral)   Resp 16   Ht 5\' 3"  (1.6 m)   Wt 46 kg   SpO2 98%   BMI 17.96 kg/m  Adult female, lying in bed, interactive and appropriate, left conjunctival redness RRR, no murmurs, no peripheral edema, no JVD Respiratory normal, lungs clear without rales or wheezes Abdomen soft without tenderness palpation or guarding, no ascites or distention Attention normal, affect and pain, judgment and insight appear mildly impaired, face symmetric, speech fluent    Data Reviewed: Discussed with oncology, general surgery, and orthopedic surgery Basic metabolic panel shows mild  hyponatremia, mild hypokalemia, creatinine stable at baseline CBC shows no leukocytosis, mild anemia  Family Communication: Husband at the bedside    Disposition: Status is: Inpatient The patient was admitted with A-fib which is already converted  More importantly, she has severe sacral pain which prevents her from walking.  This is clearly related to her large pelvic mass, and new sacral fractures.  Extensive time was spent today discussing with orthopedic surgery, general surgery, and oncology, and it does not appear there are surgical options  Oncology/palliative care have negotiated a plan for oral opiate titration over the next 24 hours, and the patient is hopeful to discharge tomorrow so that she can follow-up with orthopedics tomorrow afternoon        Author: Alberteen Sam, MD 11/13/2023 5:35 PM  For on call review [email or web address].

## 2023-11-13 NOTE — Assessment & Plan Note (Signed)
No longer on statin  

## 2023-11-13 NOTE — Assessment & Plan Note (Signed)
-   Continue prednisone - Consult Oncology, appreciate expertise

## 2023-11-13 NOTE — Consult Note (Signed)
Palliative Care Consult Note                                  Date: 11/13/2023   Patient Name: [name]  DOB: [birth year]  MRN: [id number]  Age / Sex: 77 y.o., female  PCP: [name], MD Referring Physician: Alberteen Sam, *  Reason for Consultation: Non pain symptom management and Pain control  HPI/Patient Profile: Palliative Care consult requested for symptom management in this 78 y.o. 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 She was admitted on 11/12/2023 from the [facility] with shortness of breath and atrial fibrillation (140s).   Past Medical History:  Diagnosis Date   Arthritis    Rheumatoid arthritis   Celiac disease    Chronic kidney disease    stage 3 from MD notes   COPD (chronic obstructive pulmonary disease) (HCC)    Dyspnea    with going up stairs   Family history of adverse reaction to anesthesia    father had hard time waking up   Headache    sinus headaches   Hot flashes    Hypertension    Iron deficiency anemia    Pneumonia    per patient "I have walking pneumonia"     Subjective:   This NP [place]thorn reviewed medical records, received report from team, assessed the patient and then met at the patient's bedside with Dr. Maryfrances Bunnell, Mrs. Boutilier and her husband to discuss diagnosis and her ongoing pain.    Concept of Palliative Care was introduced as specialized medical care for people and their families living with serious illness.  It focuses on providing relief from the symptoms and stress of a serious illness.  The goal is to improve quality of life for both the patient and the family. Values and goals of care important to patient and family were attempted to be elicited.  I created space and opportunity for patient and  family to explore state of health prior to admission, thoughts, and feelings.   [name] complains of persistent and worsening pain in the left hip and back. The pain, described as a strong ache that occasionally sharpens, shooting, throbbing, and has been present for approximately a one to two weeks but has worsened over the past week. The patient reported that the pain is not influenced by movement or changes in position and does not seem to be alleviated or exacerbated by any specific activities. The pain is constant, with the lowest reported level being a 4 on a scale of 0 to 10, and the highest reaching a 9. At the time of the conversation, the patient rated the pain as a 7.  [name] had been managing the pain at home with Percocet (oxycodone and acetaminophen) and Tylenol (acetaminophen), but found that the pain was not adequately controlled, leading to the current hospital admission. She states that the pain had been manageable a week prior but had since increased to the point of being unmanageable, even with medication. The patient also reported that the use pain medication caused drowsiness, leading to a decrease in the frequency of administration. Mr. Maida confirms this may have also contributed to some of her pain due to limited use of medication out of concern.   Patient shared with me  prior to admission she kept up with her schedule by documenting her administration times on her cell phone. She pulled up her documentation and we were able to review together. Upon review it was identified that she had went a long stent of hours without medication which she is unsure if she had fallen asleep or did not document although husband confirms she is very detailed oriented.   Given minimal to no improvement in her pain with Percocet, the patient was switched to Dilaudid (hydromorphone), which seemed to provide better pain control than the Percocet, reducing the pain to a level 4 for a short  period. However, the pain relief does not last the full four hours between doses, and the pain begins to increase within three hours after administration. The patient reported no adverse effects from the Dilaudid, such as nausea, rash, shortness of breath, or excessive drowsiness. She reports feeling somewhat drowsy initially after taking but manageable.   I discussed at length the etiology and realistic goals of managing her pain. I shared with [name] and her husband it would be unrealistic to expect zero pain at this time with understanding of why she is having such pain. Education provided on palliative's role in her pain management while hospitalized in addition to ongoing support at [facility] once discharged. We discussed focusing on gaining better control of her pain with a goal of gaining some comfort, improving her quality of life, while setting short term pain goals. Once these are reached over time, we will continue to consistently evaluate regimen and adjust goals that are tailored to her personal needs. She verbalized understanding and appreciation.   [name] expressed concerns about managing the pain effectively while also avoiding overmedication. I assured her she would be prescribed medications appropriately and if taken as directed this would not be of concern. We spent time focusing on what an achievable and realistic short-term pain goal for her would be. The patient's expressed goal is to maintain a consistent pain  level of no more than 5 on the pain scale if at all possible.   I discussed at length her regimen. We will continue with current Dilaudid tablets with some adjustments to frequency given pain begins to increase around 3 hours. Tylenol scheduled for additional support. She would like to try this regimen overnight and evaluate tomorrow in preparation for discharging home.   All questions answered and support provided.  I discussed the importance of continued  conversation with family and their medical providers regarding overall plan of care and treatment options, ensuring decisions are within the context of the patients values and GOCs.  Questions and concerns were addressed. The patient and family was encouraged to call with questions or concerns.  PMT will continue to support holistically as needed.   Objective:   Primary Diagnoses: Present on Admission:  Atrial fibrillation with RVR (HCC)  Stage 2 moderate COPD by GOLD classification (HCC)  CMML (chronic myelomonocytic leukemia) (HCC)  Chronic kidney disease, stage 3a (HCC)  Hypokalemia  Malnutrition of moderate degree  Pure hypercholesterolemia   Scheduled Meds:  acetaminophen  1,000 mg Oral TID   apixaban  5 mg Oral BID   arformoterol  15 mcg Nebulization BID   And   umeclidinium bromide  1 puff Inhalation Daily   Chlorhexidine Gluconate Cloth  6 each Topical Daily   diltiazem  360 mg Oral Daily   furosemide  20 mg Oral Daily   Glycerin (Adult)  1 suppository Rectal Once  metoprolol tartrate  150 mg Oral BID   potassium chloride  40 mEq Oral BID   predniSONE  5 mg Oral Q breakfast   sodium chloride flush  10-40 mL Intracatheter Q12H    Continuous Infusions:   PRN Meds: acetaminophen **OR** acetaminophen, HYDROmorphone, melatonin, ondansetron **OR** ondansetron (ZOFRAN) IV, sodium chloride flush, sorbitol  Allergies  Allergen Reactions   Digoxin And Related Rash    Generalized total body rash with immense itching   Gluten Meal Diarrhea   Penicillins Rash and Other (See Comments)      Did it involve swelling of the face/tongue/throat, SOB, or low BP? No Did it involve sudden or severe rash/hives, skin peeling, or any reaction on the inside of your mouth or nose? Yes Did you need to seek medical attention at a hospital or doctor's office? No When did it last happen?    "Many Years Ago"  If all above answers are "NO", may proceed with cephalosporin use.       Fexofenadine     Other Reaction(s): dry mouth   Alendronate Rash   Hydroxychloroquine Rash    Review of Systems  Constitutional:  Positive for activity change and fatigue.  HENT:         Left eye red   Musculoskeletal:  Positive for arthralgias and back pain.       Sacral and left hip pain  Unless otherwise noted, a complete review of systems is negative.  Physical Exam General: NAD, left eye redness Cardiovascular: regular rate and rhythm Pulmonary: normal breathing pattern Extremities: no edema, no joint deformities Skin: no rashes, warm and dry Neurological: AAO x3  Vital Signs:  BP (!) 111/57 (BP Location: Left Arm)   Pulse 65   Temp 98 F (36.7 C) (Oral)   Resp 16   Ht 5\' 3"  (1.6 m)   Wt 46 kg   SpO2 98%   BMI 17.96 kg/m  Pain Scale: 0-10   Pain Score: 7   SpO2: SpO2: 98 % O2 Device:SpO2: 98 % O2 Flow Rate: .   IO: Intake/output summary:  Intake/Output Summary (Last 24 hours) at 11/13/2023 1600 Last data filed at 11/13/2023 1502 Gross per 24 hour  Intake 2192.67 ml  Output 600 ml  Net 1592.67 ml    LBM: Last BM Date : 11/11/23 Baseline Weight: Weight: 46 kg Most recent weight: Weight: 46 kg      Palliative Assessment/Data:    Advanced Care Planning:   Primary Decision Maker: PATIENT  Code Status/Advance Care Planning: Full code   Assessment & Plan:   SUMMARY OF RECOMMENDATIONS    Metastatic Cancer with Spinal/Sacral Involvement Persistent pain in the back, sacral, and left hip, described as aching, throbbing, shooting, and occasionally sharp. Pain is not positional and does not change with movement. Pain level fluctuates between 4 and 9, currently at 7. Received hydromorphone 2mg  3 hours prior to visit.   -Continue Dilaudid as needed, with a goal to maintain pain at a level of 4 or 5. -Will adjust Dilaudid frequency to every 3 hours as needed, based on the patient's reported pain pattern. - Tylenol 1000mg  every 8 hours. -Encourage  patient to request pain medication when pain level reaches 6-7 to prevent escalation to unmanageable levels. -Will continue to closely monitor and support. If pain is controlled goal is to discharge home in 24 hours per [facility] attending. Palliative will follow-up outpatient at [facility].  -Bowel regimen in the setting of opioid use.  Discharge Planning Patient to be discharged tomorrow with the same pain management regimen unless changes occur overnight.  -Ensure prescriptions are ready for delivery to the patient's room before discharge. -Follow-up visit scheduled for Monday with myself at [facility].  -Plan to call the patient on Friday to check on her condition prior to the weekend. -Please contact palliative team via secure chat for urgent unmet palliative needs.   Discussed with: Dr. Maryfrances Bunnell and Dr. Mosetta Putt  Patient and husband  expressed understanding and was in agreement with this plan.    Time Total: 90 min  Visit consisted of counseling and education dealing with the complex and emotionally intense issues of symptom management and palliative care in the setting of serious and potentially life-threatening illness.  Signed by:  Willette Alma, AGPCNP-BC Palliative Medicine [facility]   Phone: [phone] Pager: [phone] Amion: Thea Alken   Thank you for allowing the Palliative Medicine Team to assist in the care of this patient. Please utilize secure chat with additional questions, if there is no response within 30 minutes please call the above phone number. Palliative Medicine Team providers are available by phone from 7am to 5pm daily and can be reached through the team cell phone.  Should this patient require assistance outside of these hours, please call the patient's attending physician.

## 2023-11-13 NOTE — Hospital Course (Addendum)
77 y.o. 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  Started on diltiazem drip and converted to sinus rhythm in the ER.    Hospitalization complicated by intractable pelvic pain.

## 2023-11-14 ENCOUNTER — Encounter: Payer: Self-pay | Admitting: Hematology

## 2023-11-14 ENCOUNTER — Other Ambulatory Visit (HOSPITAL_COMMUNITY): Payer: Self-pay

## 2023-11-14 DIAGNOSIS — M533 Sacrococcygeal disorders, not elsewhere classified: Secondary | ICD-10-CM

## 2023-11-14 DIAGNOSIS — M47896 Other spondylosis, lumbar region: Secondary | ICD-10-CM | POA: Diagnosis not present

## 2023-11-14 DIAGNOSIS — Z515 Encounter for palliative care: Secondary | ICD-10-CM | POA: Diagnosis not present

## 2023-11-14 DIAGNOSIS — M545 Low back pain, unspecified: Secondary | ICD-10-CM | POA: Diagnosis not present

## 2023-11-14 DIAGNOSIS — R1909 Other intra-abdominal and pelvic swelling, mass and lump: Secondary | ICD-10-CM | POA: Diagnosis not present

## 2023-11-14 DIAGNOSIS — I4891 Unspecified atrial fibrillation: Secondary | ICD-10-CM | POA: Diagnosis not present

## 2023-11-14 DIAGNOSIS — C9310 Chronic myelomonocytic leukemia not having achieved remission: Secondary | ICD-10-CM

## 2023-11-14 DIAGNOSIS — M48061 Spinal stenosis, lumbar region without neurogenic claudication: Secondary | ICD-10-CM

## 2023-11-14 LAB — CBC
HCT: 32 % — ABNORMAL LOW (ref 36.0–46.0)
Hemoglobin: 9.9 g/dL — ABNORMAL LOW (ref 12.0–15.0)
MCH: 34 pg (ref 26.0–34.0)
MCHC: 30.9 g/dL (ref 30.0–36.0)
MCV: 110 fL — ABNORMAL HIGH (ref 80.0–100.0)
Platelets: 177 10*3/uL (ref 150–400)
RBC: 2.91 MIL/uL — ABNORMAL LOW (ref 3.87–5.11)
RDW: 14.4 % (ref 11.5–15.5)
WBC: 8.2 10*3/uL (ref 4.0–10.5)
nRBC: 0 % (ref 0.0–0.2)

## 2023-11-14 LAB — BASIC METABOLIC PANEL
Anion gap: 8 (ref 5–15)
BUN: 18 mg/dL (ref 8–23)
CO2: 25 mmol/L (ref 22–32)
Calcium: 9.1 mg/dL (ref 8.9–10.3)
Chloride: 103 mmol/L (ref 98–111)
Creatinine, Ser: 0.87 mg/dL (ref 0.44–1.00)
GFR, Estimated: >60 mL/min (ref >60)
Glucose, Bld: 92 mg/dL (ref 70–99)
Potassium: 4.3 mmol/L (ref 3.5–5.1)
Sodium: 136 mmol/L (ref 135–145)

## 2023-11-14 MED ORDER — NAPROXEN 375 MG PO TABS: 375.0000 mg | ORAL_TABLET | Freq: Two times a day (BID) | ORAL | 0 refills | Status: DC | PRN

## 2023-11-14 MED ORDER — ACETAMINOPHEN 500 MG PO TABS
1000.0000 mg | ORAL_TABLET | Freq: Three times a day (TID) | ORAL | 0 refills | Status: DC
  Filled 2023-11-14: qty 30, 5d supply, fill #0

## 2023-11-14 MED ORDER — NAPROXEN 375 MG PO TABS
375.0000 mg | ORAL_TABLET | Freq: Two times a day (BID) | ORAL | Status: DC
  Administered 2023-11-14: 375 mg via ORAL
  Filled 2023-11-14 (×2): qty 1

## 2023-11-14 MED ORDER — NAPROXEN 375 MG PO TABS: 375.0000 mg | ORAL_TABLET | Freq: Two times a day (BID) | ORAL | Status: DC

## 2023-11-14 MED ORDER — HYDROMORPHONE HCL 2 MG PO TABS
2.0000 mg | ORAL_TABLET | ORAL | 0 refills | Status: DC | PRN
  Filled 2023-11-14: qty 30, 4d supply, fill #0

## 2023-11-14 MED ORDER — NAPROXEN 375 MG PO TABS
375.0000 mg | ORAL_TABLET | Freq: Two times a day (BID) | ORAL | 0 refills | Status: DC | PRN
  Filled 2023-11-14: qty 2, 1d supply, fill #0

## 2023-11-14 MED ORDER — HEPARIN SOD (PORK) LOCK FLUSH 100 UNIT/ML IV SOLN
500.0000 [IU] | INTRAVENOUS | Status: AC | PRN
  Administered 2023-11-14: 500 [IU]

## 2023-11-14 NOTE — Progress Notes (Signed)
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  She was seen by our symptom management clinic NP yesterday, and was admitted for rapid A-fib and worsening low back pain and left hip pain.  She is responding to oral Dilaudid well and her pain is overall controlled.   Objective:  Vitals:   11/13/23 2135 11/14/23 0428  BP:  120/67  Pulse:  72  Resp:  16  Temp:  99.1 F (37.3 C)  SpO2: 95% 98%    Body mass index is 17.96 kg/m.  Intake/Output Summary (Last 24 hours) at 11/14/2023 0721 Last data filed at 11/13/2023 1502 Gross per 24 hour  Intake 376.33 ml  Output 600 ml  Net -223.67 ml     Sclerae unicteric  Oropharynx clear  No peripheral adenopathy  Lungs clear -- no rales or rhonchi  Heart regular rate and rhythm  Abdomen benign  MSK no focal spinal tenderness, no peripheral edema  Neuro nonfocal    CBG (last 3)  No results for input(s): "GLUCAP" in the last 72 hours.   Labs:  Lab Results  Component Value Date   WBC 8.2 11/14/2023   HGB 9.9 (L) 11/14/2023   HCT 32.0 (L) 11/14/2023   MCV 110.0 (H) 11/14/2023   PLT 177 11/14/2023   NEUTROABS 7.4 11/12/2023    @LASTCHEMISTRY @  Urine Studies No results for input(s): "UHGB", "CRYS" in the last 72 hours.  Invalid input(s): "UACOL", "UAPR", "USPG", "UPH", "UTP", "UGL", "UKET", "UBIL", "UNIT", "UROB", "ULEU", "UEPI", "UWBC", "URBC", "UBAC", "CAST", "UCOM", "BILUA"  Basic Metabolic Panel: Recent Labs  Lab 11/12/23 1436 11/12/23 1542 11/13/23 0255 11/14/23 0406  NA 135 136 131* 136  K 4.1 3.8 3.4* 4.3  CL 98 99 97* 103  CO2 27 21* 27 25  GLUCOSE 115* 119* 131* 92  BUN 24* 23 25* 18  CREATININE 0.94 0.98 0.98 0.87  CALCIUM 9.8 9.3 8.4* 9.1  MG  --   --  1.8  --    GFR Estimated Creatinine Clearance: 39.9 mL/min (by C-G formula based on SCr of 0.87 mg/dL). Liver Function  Tests: Recent Labs  Lab 11/12/23 1436 11/12/23 1542  AST 16 24  ALT 10 12  ALKPHOS 115 110  BILITOT 0.8 1.1  PROT 6.7 7.2  ALBUMIN 4.2 4.1   No results for input(s): "LIPASE", "AMYLASE" in the last 168 hours. No results for input(s): "AMMONIA" in the last 168 hours. Coagulation profile No results for input(s): "INR", "PROTIME" in the last 168 hours.  CBC: Recent Labs  Lab 11/12/23 1436 11/12/23 1542 11/13/23 0255 11/14/23 0406  WBC 10.5 11.6* 10.9* 8.2  NEUTROABS 7.4  --   --   --   HGB 12.2 12.2 9.5* 9.9*  HCT 38.4 38.3 30.4* 32.0*  MCV 105.2* 106.4* 108.6* 110.0*  PLT 222 230 163 177   Cardiac Enzymes: No results for input(s): "CKTOTAL", "CKMB", "CKMBINDEX", "TROPONINI" in the last 168 hours. BNP: Invalid input(s): "POCBNP" CBG: No results for input(s): "GLUCAP" in the last 168 hours. D-Dimer No results for input(s): "DDIMER" in the last 72 hours. Hgb A1c No results for input(s): "HGBA1C" in the last 72 hours. Lipid Profile No results for input(s): "CHOL", "HDL", "LDLCALC", "TRIG", "CHOLHDL", "LDLDIRECT" in the last 72 hours. Thyroid function studies No results for input(s): "TSH", "T4TOTAL", "T3FREE", "THYROIDAB" in the last 72 hours.  Invalid input(s): "FREET3" Anemia work  up No results for input(s): "VITAMINB12", "FOLATE", "FERRITIN", "TIBC", "IRON", "RETICCTPCT" in the last 72 hours. Microbiology No results found for this or any previous visit (from the past 240 hour(s)).    Studies:  CT ABDOMEN PELVIS W CONTRAST  Result Date: 11/12/2023 CLINICAL DATA:  Left-sided back pain for several days, history of the key Mia EXAM: CT ABDOMEN AND PELVIS WITH CONTRAST TECHNIQUE: Multidetector CT imaging of the abdomen and pelvis was performed using the standard protocol following bolus administration of intravenous contrast. RADIATION DOSE REDUCTION: This exam was performed according to the departmental dose-optimization program which includes automated exposure  control, adjustment of the mA and/or kV according to patient size and/or use of iterative reconstruction technique. CONTRAST:  OMNIPAQUE IOHEXOL 300 MG/ML  SOLN COMPARISON:  07/09/2023 FINDINGS: Lower chest: Small right pleural effusion is noted. Stable right paraspinal mass is seen although incompletely evaluated when compared with the prior examination. Enhancing lesion along the right lung base posteriorly within the pleural space is noted as well this is also stable from prior study. Hepatobiliary: No focal liver abnormality is seen. No gallstones, gallbladder wall thickening, or biliary dilatation. Pancreas: Unremarkable. No pancreatic ductal dilatation or surrounding inflammatory changes. Spleen: Normal in size without focal abnormality. Adrenals/Urinary Tract: Adrenal glands are within normal limits. Kidneys demonstrate cystic change bilaterally worse on the right than the left. No follow-up is recommended as these are stable from the prior exam. The bladder is partially distended. Stomach/Bowel: Scattered diverticular change of the colon is noted. No obstructive or inflammatory changes of the colon are seen. The appendix is within normal limits. Small bowel and stomach are unremarkable. Vascular/Lymphatic: Aortic atherosclerosis. No enlarged abdominal or pelvic lymph nodes. Reproductive: Uterus and bilateral adnexa are unremarkable. Other: Presacral mass lesions are again identified similar to that seen on the prior study. They demonstrate mixed attenuation and are consistent with the given clinical history of underlying leukemia. Musculoskeletal: No acute or significant osseous findings. IMPRESSION: Stable soft tissue mass lesions in the presacral region as well as along the posterior right pleural margin and right paraspinal space. These findings are similar to that seen on prior examination consistent with the given clinical history. Diverticulosis without diverticulitis. Electronically Signed   By:  Alcide Clever M.D.   On: 11/12/2023 19:58   DG Chest Port 1 View  Result Date: 11/12/2023 CLINICAL DATA:  Atrial fibrillation EXAM: PORTABLE CHEST 1 VIEW COMPARISON:  07/09/2023 FINDINGS: Right chest port remains in place. Mild cardiomegaly. Aortic atherosclerosis. No focal airspace consolidation, pleural effusion, or pneumothorax. Right shoulder prosthesis. IMPRESSION: No active disease. Electronically Signed   By: Duanne Guess D.O.   On: 11/12/2023 18:40   CT L-SPINE NO CHARGE  Result Date: 11/12/2023 CLINICAL DATA:  New left-sided back pain EXAM: CT LUMBAR SPINE WITHOUT CONTRAST TECHNIQUE: Multidetector CT imaging of the lumbar spine was performed without intravenous contrast administration. Multiplanar CT image reconstructions were also generated. RADIATION DOSE REDUCTION: This exam was performed according to the departmental dose-optimization program which includes automated exposure control, adjustment of the mA and/or kV according to patient size and/or use of iterative reconstruction technique. COMPARISON:  Pelvis MR 10/12/23 FINDINGS: Segmentation: 5 lumbar type vertebrae. Alignment: Trace retrolisthesis of L1 on L2. There is right lateral listhesis of L3 on L4. Vertebrae: There is mild asymmetric height loss of the superior endplate of T12 (series 5, image 58). Redemonstrated heterogeneous appearance of the sacrum, in keeping with previously seen on bilateral sacral insufficiency fractures. Paraspinal and other soft tissues: Aortic  atherosclerotic calcifications. Redemonstrated is a partially imaged presacral mass. Diverticulosis without diverticulitis. Disc levels: There is likely moderate to severe spinal canal narrowing at L3-L4. Severe right-sided neural foraminal stenosis at L4-L5 and L5-S1 severe left-sided neural foraminal stenosis at L5-S1 IMPRESSION: 1. Mild asymmetric height loss of the superior endplate of T12, which is age indeterminate. Correlate with point tenderness. 2.  Redemonstrated heterogeneous appearance of the sacrum, in keeping with previously seen bilateral sacral insufficiency fractures. 3. Redemonstrated partially imaged presacral mass. See same day CT abdomen/pelvis for further characterization. 4. Likely moderate to severe spinal canal narrowing at L3-L4. 5. Severe right-sided neural foraminal stenosis at L4-L5 and L5-S1 and severe left-sided neural foraminal stenosis at L5-S1. Aortic Atherosclerosis (ICD10-I70.0). Electronically Signed   By: Lorenza Cambridge M.D.   On: 11/12/2023 18:22    Assessment: 77 y.o. 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 -Curbside consult to orthopedic surgeon by Dr. Maryfrances Bunnell, no surgical intervention was recommended.  Patient does have appointment with spine orthopedic surgeon Dr. [name] tomorrow. -I sent a message to the on-call neurosurgeon to see if she is a candidate for nerve block or pain pump for her pain control -I appreciated palliative care NP Nikki seeing her today, she will likely go home with oral Dilaudid as needed, she is responding well overall. -I will postpone her chemo treatment which is scheduled for next Monday, for a week due to her severe pain and upcoming Thanksgiving holiday. -I will follow-up her in clinic after discharge.   Sherry Mood, MD 11/13/2023

## 2023-11-14 NOTE — Discharge Summary (Signed)
Physician Discharge Summary  [name] UJW:[id number] DOB: [birth year] DOA: 11/12/2023  PCP: Sherry Bowl, MD  Admit date: 11/12/2023 Discharge date: 11/14/2023  Time spent: 45 minutes  Recommendations for Outpatient Follow-up:  Outpatient follow-up with [name] scheduled for today CC Dr. 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--follows with oncologist [name] initially 11/09/2021 Ch COPD CKD iiiA Chr AFib on eliquis Known history of rheumatoid arthritis on Remicade previously HF R EF EF 45% previously Multiple pulmonary nodules previously   Admit from oncology office with Rapid afib  and intractable pelvic pain in her l side and back--with a Fracture prior in Oct 2024--non responsive to Oxycodone She had an MRI on 10/12/2023 showing a presacral mass 10.2 X5.5X 7.2 with fatty elements and various regions of internal enhancement-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 resumed on Cardizem CD 360 will continue on discharge metoprolol 150 twice daily and continue on Eliquis  Sacral pain indolent quite severe with known presacral mass consistent with cancer Recent CT 06/2023  showed enlargement MRI done October 2024 showed sacral insufficiency fractures and was being followed by [name] in the outpatient setting [name] will see the patient this afternoon 11/20-there is no surgical intervention for stabilization at this time-I will discharge the patient on Dilaudid for severe pain Tylenol around-the-clock for baseline pain control and give a small dose of Naprosyn twice daily with meals as needed for pain Oncologist already aware of treatments--- palliative care saw the patient and patient is a little bit better on Dilaudid and is ambulatory Outpatient discussion about nerve blocks etc. as an outpatient  Moderate malnutrition Follow-up as an outpatient  Chronic mono myelocytic leukemia Continue prednisone 5  Resolved hypokalemia  Stage II stable COPD   Discharge Exam: Vitals:   11/14/23 0853 11/14/23 0911  BP:  136/66  Pulse:    Resp:    Temp:    SpO2: 97%     Subj on day of d/c   Awake coherent no distress some pain but little bit better in the afternoon and hence wanted to go home  General Exam on discharge  EOMI NCAT no focal deficit Chest is clear no wheeze rales rhonchi Abdomen soft no rebound no guarding ROM intact Straight leg raise bilaterally equal  Discharge Instructions   Discharge Instructions     Diet - low sodium heart healthy   Complete by: As directed    Discharge instructions   Complete by: As directed    Make sure that you take the Naprosyn with food for low back pain--- you can use of the low doses of Dilaudid that we have you on until you follow-up with [name]  Use Tylenol around-the-clock Report any bleeding from the nose or any rectal bleeding as Naprosyn can cause this side effect if you are on a blood thinner Please follow-up with your oncologist in the outpatient setting they will set you up with a follow-up and a visit and I will CC them Use laxatives at home if you have severe constipation   Increase  activity slowly   Complete by: As directed       Allergies as of 11/14/2023       Reactions   Digoxin And Related Rash   Generalized total body rash with immense itching   Gluten Meal Diarrhea   Penicillins Rash, Other (See Comments)   Did it involve swelling of the face/tongue/throat, SOB, or low BP? No Did it involve sudden or severe rash/hives, skin peeling, or any reaction on the inside of your mouth or nose? Yes Did you need to seek medical attention at a hospital or doctor's office? No When did it last happen?    "Many Years Ago"  If all above answers are "NO", may proceed with cephalosporin use.   Fexofenadine    Other Reaction(s): dry mouth   Alendronate Rash   Hydroxychloroquine Rash        Medication List     STOP taking these medications    acetaminophen-codeine 300-30 MG tablet Commonly known as: TYLENOL #3   oxyCODONE-acetaminophen 5-325 MG tablet Commonly known as: PERCOCET/ROXICET   potassium chloride SA 20 MEQ tablet Commonly known as: Klor-Con M20       TAKE these medications    acetaminophen 500 MG tablet Commonly known as: TYLENOL Take 2 tablets (1,000 mg total) by mouth 3 (three) times daily.   apixaban 5 MG Tabs tablet Commonly known as: ELIQUIS Take 1 tablet (5 mg total) by mouth 2 (two) times daily.   denosumab 60 MG/ML Sosy injection Commonly known as: PROLIA Inject 60 mg into the skin every 6 (six) months.   diltiazem 360 MG 24 hr capsule Commonly known as: Cardizem CD Take 1 capsule (360 mg total) by mouth daily.   furosemide 20 MG tablet Commonly known as: Lasix Take 1 tablet (20 mg total) by mouth daily.   gabapentin 300 MG capsule Commonly known as: NEURONTIN Take 300 mg by mouth in the morning.   HYDROmorphone 2 MG tablet Commonly known as: DILAUDID Take 1 tablet (2 mg total) by mouth every 3 (three) hours as needed for severe pain (pain score 7-10) or moderate pain (pain score 4-6).   Metoprolol Tartrate 75 MG  Tabs Take 2 tablets (150 mg total) by mouth 2 (two) times daily.   naproxen 375 MG tablet Commonly known as: NAPROSYN Take 1 tablet (375 mg total) by mouth 2 (two) times daily as needed.   predniSONE 5 MG tablet Commonly known as: DELTASONE Take 5 mg by mouth daily with breakfast.   Stiolto Respimat 2.5-2.5 MCG/ACT Aers Generic drug: Tiotropium Bromide-Olodaterol Inhale 2 puffs into the lungs daily.       Allergies  Allergen Reactions   Digoxin And Related Rash    Generalized total body rash with immense itching   Gluten Meal Diarrhea   Penicillins Rash and Other (See Comments)      Did it involve swelling of the face/tongue/throat, SOB, or low BP? No Did it involve sudden or severe rash/hives, skin peeling, or any reaction on the inside of your mouth or nose? Yes Did you need to seek medical attention at a hospital or doctor's office?  No When did it last happen?    "Many Years Ago"  If all above answers are "NO", may proceed with cephalosporin use.      Fexofenadine     Other Reaction(s): dry mouth   Alendronate Rash   Hydroxychloroquine Rash    Follow-up Information     [facility], [place] Follow up.   Why: [facility] Contact information: [address] [place] [address] [phone]                  The results of significant diagnostics from this hospitalization (including imaging, microbiology, ancillary and laboratory) are listed below for reference.    Significant Diagnostic Studies: CT ABDOMEN PELVIS W CONTRAST  Result Date: 11/12/2023 CLINICAL DATA:  Left-sided back pain for several days, history of the key Mia EXAM: CT ABDOMEN AND PELVIS WITH CONTRAST TECHNIQUE: Multidetector CT imaging of the abdomen and pelvis was performed using the standard protocol following bolus administration of intravenous contrast. RADIATION DOSE REDUCTION: This exam was performed according to the  departmental dose-optimization program which includes automated exposure control, adjustment of the mA and/or kV according to patient size and/or use of iterative reconstruction technique. CONTRAST:  OMNIPAQUE IOHEXOL 300 MG/ML  SOLN COMPARISON:  07/09/2023 FINDINGS: Lower chest: Small right pleural effusion is noted. Stable right paraspinal mass is seen although incompletely evaluated when compared with the prior examination. Enhancing lesion along the right lung base posteriorly within the pleural space is noted as well this is also stable from prior study. Hepatobiliary: No focal liver abnormality is seen. No gallstones, gallbladder wall thickening, or biliary dilatation. Pancreas: Unremarkable. No pancreatic ductal dilatation or surrounding inflammatory changes. Spleen: Normal in size without focal abnormality. Adrenals/Urinary Tract: Adrenal glands are within normal limits. Kidneys demonstrate cystic change bilaterally worse on the right than the left. No follow-up is recommended as these are stable from the prior exam. The bladder is partially distended. Stomach/Bowel: Scattered diverticular change of the colon is noted. No obstructive or inflammatory changes of the colon are seen. The appendix is within normal limits. Small bowel and stomach are unremarkable. Vascular/Lymphatic: Aortic atherosclerosis. No enlarged abdominal or pelvic lymph nodes. Reproductive: Uterus and bilateral adnexa are unremarkable. Other: Presacral mass lesions are again identified similar to that seen on the prior study. They demonstrate mixed attenuation and are consistent with the given clinical history of underlying leukemia. Musculoskeletal: No acute or significant osseous findings. IMPRESSION: Stable soft tissue mass lesions in the presacral region as well as along the posterior right pleural margin and right paraspinal space. These findings are similar to that seen on prior examination consistent with the given clinical  history. Diverticulosis without diverticulitis. Electronically Signed   By: Alcide Clever M.D.   On: 11/12/2023 19:58   DG Chest Port 1 View  Result Date: 11/12/2023 CLINICAL DATA:  Atrial fibrillation EXAM: PORTABLE CHEST 1 VIEW COMPARISON:  07/09/2023 FINDINGS: Right chest port remains in place. Mild cardiomegaly. Aortic atherosclerosis. No focal airspace consolidation, pleural effusion, or pneumothorax. Right shoulder prosthesis. IMPRESSION: No active disease. Electronically Signed   By: Duanne Guess D.O.   On: 11/12/2023 18:40   CT L-SPINE NO CHARGE  Result Date: 11/12/2023 CLINICAL DATA:  New left-sided back pain EXAM: CT LUMBAR SPINE WITHOUT CONTRAST TECHNIQUE: Multidetector CT imaging of the lumbar spine was performed without intravenous contrast administration. Multiplanar CT image reconstructions were also generated. RADIATION DOSE REDUCTION: This exam was performed according to the departmental  dose-optimization program which includes automated exposure control, adjustment of the mA and/or kV according to patient size and/or use of iterative reconstruction technique. COMPARISON:  Pelvis MR 10/12/23 FINDINGS: Segmentation: 5 lumbar type vertebrae. Alignment: Trace retrolisthesis of L1 on L2. There is right lateral listhesis of L3 on L4. Vertebrae: There is mild asymmetric height loss of the superior endplate of T12 (series 5, image 58). Redemonstrated heterogeneous appearance of the sacrum, in keeping with previously seen on bilateral sacral insufficiency fractures. Paraspinal and other soft tissues: Aortic atherosclerotic calcifications. Redemonstrated is a partially imaged presacral mass. Diverticulosis without diverticulitis. Disc levels: There is likely moderate to severe spinal canal narrowing at L3-L4. Severe right-sided neural foraminal stenosis at L4-L5 and L5-S1 severe left-sided neural foraminal stenosis at L5-S1 IMPRESSION: 1. Mild asymmetric height loss of the superior endplate of  T12, which is age indeterminate. Correlate with point tenderness. 2. Redemonstrated heterogeneous appearance of the sacrum, in keeping with previously seen bilateral sacral insufficiency fractures. 3. Redemonstrated partially imaged presacral mass. See same day CT abdomen/pelvis for further characterization. 4. Likely moderate to severe spinal canal narrowing at L3-L4. 5. Severe right-sided neural foraminal stenosis at L4-L5 and L5-S1 and severe left-sided neural foraminal stenosis at L5-S1. Aortic Atherosclerosis (ICD10-I70.0). Electronically Signed   By: Lorenza Cambridge M.D.   On: 11/12/2023 18:22    Microbiology: No results found for this or any previous visit (from the past 240 hour(s)).   Labs: Basic Metabolic Panel: Recent Labs  Lab 11/12/23 1436 11/12/23 1542 11/13/23 0255 11/14/23 0406  NA 135 136 131* 136  K 4.1 3.8 3.4* 4.3  CL 98 99 97* 103  CO2 27 21* 27 25  GLUCOSE 115* 119* 131* 92  BUN 24* 23 25* 18  CREATININE 0.94 0.98 0.98 0.87  CALCIUM 9.8 9.3 8.4* 9.1  MG  --   --  1.8  --    Liver Function Tests: Recent Labs  Lab 11/12/23 1436 11/12/23 1542  AST 16 24  ALT 10 12  ALKPHOS 115 110  BILITOT 0.8 1.1  PROT 6.7 7.2  ALBUMIN 4.2 4.1   No results for input(s): "LIPASE", "AMYLASE" in the last 168 hours. No results for input(s): "AMMONIA" in the last 168 hours. CBC: Recent Labs  Lab 11/12/23 1436 11/12/23 1542 11/13/23 0255 11/14/23 0406  WBC 10.5 11.6* 10.9* 8.2  NEUTROABS 7.4  --   --   --   HGB 12.2 12.2 9.5* 9.9*  HCT 38.4 38.3 30.4* 32.0*  MCV 105.2* 106.4* 108.6* 110.0*  PLT 222 230 163 177   Cardiac Enzymes: No results for input(s): "CKTOTAL", "CKMB", "CKMBINDEX", "TROPONINI" in the last 168 hours. BNP: BNP (last 3 results) No results for input(s): "BNP" in the last 8760 hours.  ProBNP (last 3 results) No results for input(s): "PROBNP" in the last 8760 hours.  CBG: No results for input(s): "GLUCAP" in the last 168  hours.     Signed:  Rhetta Mura MD   [facility] 11/14/2023, 12:43 PM

## 2023-11-14 NOTE — Plan of Care (Signed)
  Problem: Education: Goal: Knowledge of General Education information will improve Description Including pain rating scale, medication(s)/side effects and non-pharmacologic comfort measures Outcome: Progressing   Problem: Health Behavior/Discharge Planning: Goal: Ability to manage health-related needs will improve Outcome: Progressing   

## 2023-11-14 NOTE — Progress Notes (Signed)
Mobility Specialist - Progress Note   11/14/23 1121  Mobility  Activity Ambulated with assistance in hallway  Level of Assistance Modified independent, requires aide device or extra time  Assistive Device Front wheel walker  Distance Ambulated (ft) 120 ft  Activity Response Tolerated well  Mobility Referral Yes  $Mobility charge 1 Mobility  Mobility Specialist Start Time (ACUTE ONLY) 1109  Mobility Specialist Stop Time (ACUTE ONLY) 1115  Mobility Specialist Time Calculation (min) (ACUTE ONLY) 6 min   Pt received in recliner and agreeable to mobility. Pt c/o L sided pain during session. No other complaints during session. Pt to bed after session with all needs met.     [facility]

## 2023-11-14 NOTE — Progress Notes (Incomplete)
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

## 2023-11-14 NOTE — TOC Transition Note (Signed)
Transition of Care [facility]) - CM/SW Discharge Note   Patient Details  Name: [name] MRN: [id number] Date of Birth: [birth year]  Transition of Care [facility] CM/SW Contact:  Lanier Clam, RN Phone Number: 11/14/2023, 1:50 PM   Clinical Narrative:   d/c home w/[facility] per prior TOC note. No further CM needs.    Final next level of care: Home w Home Health Services Barriers to Discharge: No Barriers Identified   Patient Goals and CMS Choice      Discharge Placement                         Discharge Plan and Services Additional resources added to the After Visit Summary for     Discharge Planning Services: CM Consult Post Acute Care Choice: Home Health                    HH Arranged: PT [facility] Agency: [facility] Date [facility] Agency Contacted: 11/13/23 Time HH Agency Contacted: [phone] Representative spoke with at [facility] Agency: Marylene Land  Social Determinants of Health (SDOH) Interventions SDOH Screenings   Food Insecurity: No Food Insecurity (11/13/2023)  Housing: Low Risk  (11/13/2023)  Transportation Needs: No Transportation Needs (11/13/2023)  Utilities: Not At Risk (11/13/2023)  Depression (PHQ2-9): Low Risk  (06/08/2023)  Tobacco Use: Medium Risk (11/13/2023)     Readmission Risk Interventions    06/23/2022   11:27 AM  Readmission Risk Prevention Plan  Transportation Screening Complete  Medication Review (RN Care Manager) Complete  PCP or Specialist appointment within 3-5 days of discharge Complete  HRI or Home Care Consult Complete  SW Recovery Care/Counseling Consult Complete  Palliative Care Screening Not Applicable  Skilled Nursing Facility Not Applicable

## 2023-11-14 NOTE — Progress Notes (Signed)
   Daily Progress Note   Patient Name: [name]       Date: 11/14/2023 DOB: August 10, 1946  Age: 77 y.o. MRN#: [id number] Attending Physician: Sherry Mura, MD Primary Care Physician: Sherry Bowl, MD Admit Date: 11/12/2023  Reason for Consultation/Follow-up: Non pain symptom management and Pain control  Subjective: Chart Reviewed. Updates Received. Patient Assessed. [name] is sitting up in bed. No acute distress noted. Husband is at the bedside. Patient shares she had a better night and pain was much better controlled compared to previous nights. Reports pain level at lowest and currently at 4 out of 10. MAR reviewed at length. [name] received 3 doses of PRN hydromorphone. Tolerated without difficulty. She is dressed and preparing for discharge home. They are planning to go to scheduled appointment with [name].   I reviewed at length pain regimen with goal to continue at home with hydromorphone as needed and scheduled Tylenol. She and husband verbalized understanding.   I will plan to contact patient by phone on Friday to follow-up on pain however they know to contact office sooner if needed.   All questions answered and support provided.    Length of Stay: 2 days  Vital Signs: BP 136/66   Pulse 72   Temp 99.1 F (37.3 C) (Oral)   Resp 16   Ht 5\' 3"  (1.6 m)   Wt 46 kg   SpO2 97%   BMI 17.96 kg/m  SpO2: SpO2: 97 % O2 Device: O2 Device: Room Air O2 Flow Rate:   Last Weight  Most recent update: 11/12/2023 11:31 PM    Weight  46 kg (101 lb 6.6 oz)             Intake/Output Summary (Last 24 hours) at 11/14/2023 1144 Last data filed at 11/13/2023 1502 Gross per 24 hour  Intake 256.33 ml  Output 200 ml  Net 56.33 ml    Physical Exam: Gen:  NAD, left eye redness.  HEENT: moist mucous membranes PULM: normal breathing pattern, No wheezes/rales/rhonchi EXT: No edema Neuro: Alert and oriented x3   Palliative Care Assessment & Plan     Recommendations/Plan:  Continue with current plan of care per medical team  Patient is discharging home per [name] attending Pain is much better controlled. Per [name] will provide with hydromorphone prescription at discharge.  Continue with Hydromorphone every 3 hours and Tylenol every 8 hours.  I will plan to contact patient by phone on Friday 11/15/2023 for close follow-up and patient to have clinic visit at [name] on Monday 11/19/2023. She is aware of appointments.    Thank you for allowing the Palliative Medicine Team to assist in the care of [name].    Palliative Medicine Team providers are available by phone from 7am to 7pm daily and can be reached through the team cell phone. Should this patient require assistance outside of these hours, please call the patient's attending physician.  Any controlled substances utilized were prescribed in the context of palliative care. PDMP has been reviewed.  Visit consisted of counseling and education dealing with the complex and emotionally intense issues of symptom management and palliative care in the setting of serious and potentially life-threatening illness.  [name], AGPCNP-BC  Palliative Medicine [facility]  [phone]  *Please note that this is a verbal dictation therefore any spelling or grammatical errors are due to the "[employer]agon [employer] One" system interpretation.

## 2023-11-15 ENCOUNTER — Other Ambulatory Visit: Payer: Self-pay

## 2023-11-15 ENCOUNTER — Encounter: Payer: Self-pay | Admitting: Hematology

## 2023-11-15 ENCOUNTER — Other Ambulatory Visit: Payer: Self-pay | Admitting: Nurse Practitioner

## 2023-11-15 DIAGNOSIS — M533 Sacrococcygeal disorders, not elsewhere classified: Secondary | ICD-10-CM

## 2023-11-15 DIAGNOSIS — N1831 Chronic kidney disease, stage 3a: Secondary | ICD-10-CM | POA: Diagnosis not present

## 2023-11-15 DIAGNOSIS — I5022 Chronic systolic (congestive) heart failure: Secondary | ICD-10-CM | POA: Diagnosis not present

## 2023-11-15 DIAGNOSIS — D509 Iron deficiency anemia, unspecified: Secondary | ICD-10-CM | POA: Diagnosis not present

## 2023-11-15 DIAGNOSIS — Z515 Encounter for palliative care: Secondary | ICD-10-CM

## 2023-11-15 DIAGNOSIS — G893 Neoplasm related pain (acute) (chronic): Secondary | ICD-10-CM

## 2023-11-15 DIAGNOSIS — E871 Hypo-osmolality and hyponatremia: Secondary | ICD-10-CM | POA: Diagnosis not present

## 2023-11-15 DIAGNOSIS — Z87891 Personal history of nicotine dependence: Secondary | ICD-10-CM | POA: Diagnosis not present

## 2023-11-15 DIAGNOSIS — I700 Atherosclerosis of aorta: Secondary | ICD-10-CM | POA: Diagnosis not present

## 2023-11-15 DIAGNOSIS — D630 Anemia in neoplastic disease: Secondary | ICD-10-CM | POA: Diagnosis not present

## 2023-11-15 DIAGNOSIS — C9310 Chronic myelomonocytic leukemia not having achieved remission: Secondary | ICD-10-CM

## 2023-11-15 DIAGNOSIS — Z7901 Long term (current) use of anticoagulants: Secondary | ICD-10-CM | POA: Diagnosis not present

## 2023-11-15 DIAGNOSIS — I130 Hypertensive heart and chronic kidney disease with heart failure and stage 1 through stage 4 chronic kidney disease, or unspecified chronic kidney disease: Secondary | ICD-10-CM | POA: Diagnosis not present

## 2023-11-15 DIAGNOSIS — I482 Chronic atrial fibrillation, unspecified: Secondary | ICD-10-CM | POA: Diagnosis not present

## 2023-11-15 DIAGNOSIS — M069 Rheumatoid arthritis, unspecified: Secondary | ICD-10-CM | POA: Diagnosis not present

## 2023-11-15 DIAGNOSIS — E440 Moderate protein-calorie malnutrition: Secondary | ICD-10-CM | POA: Diagnosis not present

## 2023-11-15 DIAGNOSIS — J449 Chronic obstructive pulmonary disease, unspecified: Secondary | ICD-10-CM | POA: Diagnosis not present

## 2023-11-15 DIAGNOSIS — Z7952 Long term (current) use of systemic steroids: Secondary | ICD-10-CM | POA: Diagnosis not present

## 2023-11-15 MED ORDER — HYDROMORPHONE HCL 2 MG PO TABS
2.0000 mg | ORAL_TABLET | ORAL | 0 refills | Status: DC | PRN
Start: 2023-11-17 — End: 2023-11-26

## 2023-11-16 ENCOUNTER — Encounter: Payer: Self-pay | Admitting: Hematology

## 2023-11-16 NOTE — Telephone Encounter (Signed)
I connected with [name] on 11/15/2023 at  by telephone and verified that I am speaking with the correct person using two identifiers.   I discussed the limitations, risks, security and privacy concerns of performing an evaluation and management service by telemedicine and the availability of in-person appointments. I also discussed with the patient that there may be a patient responsible charge related to this service. The patient expressed understanding and agreed to proceed.   Other persons participating in the visit and their role in the encounter: [name]    Patient's location: Home   Provider's location: [facility]    Chief Complaint: Medication Management   Spoke with patient and husband by phone to discuss her ongoing hip, lower back, and sacral pain. Patient also concerned about running out of hydromorphone prior to initial visit. Refill sent to her requested pharmacy to be picked-up over the weekend to prevent lapse in medication.   [name] reports medication regimen is providing some relief with pain level decreasing down to around a 5 out of 10. This remains consistent with improved levels at time of discharge which is improved from 7-9 out of 10.   Unfortunately patient is unable to take NSAIDS due to her chronic use of Eliquis. She is also taking daily Prednisone. I reviewed at length her medications confirming all active medications. Patient has been instructed by Orthopedic to discontinue use of Naproxen. This has been confirmed by oncology team.   I advised husband if there are other medications that patient is to take I will advise at later time however for now she is to continue with hydromorphone 2 mg every 3 hours as needed for pain.   1755: Unable to reach patient. Left appropriate voicemail directing patient to continue with hydromorphone 2-3mg  every 3 hours as needed for pain. Again confirming she is not at eligible to take she NSAIDS at this time due to risk of  bleeding. Patient aware I will contact she and husband at later time. Also confirming phone visit on Monday 11/19/2023.

## 2023-11-16 NOTE — Progress Notes (Unsigned)
Palliative Medicine [facility]  [phone] [phone]   Name: [name] Date: 11/16/2023 MRN: [id number]  DOB: [birth year]  Patient Care Team: [name], MD as PCP - General (Internal Medicine) [name], Deloris Ping, MD as PCP - Cardiology (Cardiology) Zenovia Jordan, MD as Consulting Physician (Rheumatology) Malachy Mood, MD as Consulting Physician (Hematology) Charna Elizabeth, MD as Consulting Physician (Gastroenterology) Josephine Igo, DO as Consulting Physician (Pulmonary Disease) Pickenpack-Cousar, Arty Baumgartner, NP as Nurse Practitioner [facility] and Palliative Medicine)   I connected with Normand Sloop on 11/20/23 at  2:00 PM EST by telephone and verified that I am speaking with the correct person using two identifiers.  I discussed the limitations, risks, security and privacy concerns of performing an evaluation and management service by telemedicine and the availability of in-person appointments. I also discussed with the patient that there may be a patient responsible charge related to this service. The patient expressed understanding and agreed to proceed.   Other persons participating in the visit and their role in the encounter: Marilynne Drivers (Husband)   Patient's location: Home  Provider's location: [facility]   Chief Complaint: Pain Management    REASON FOR CONSULTATION: [name] is a 77 y.o. 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  Palliative ask to see for symptom management and goals of care.    SOCIAL HISTORY:     reports that she quit smoking about 19 years ago. Her smoking use included cigarettes. She started smoking about 49 years ago. She has a 30 pack-year smoking history. She has never used smokeless tobacco. She reports current alcohol use of about 12.0 - 14.0 standard drinks of alcohol per week. She reports that she does not use  drugs.  ADVANCE DIRECTIVES:  None on file  CODE STATUS: Full code  PAST MEDICAL HISTORY: Past Medical History:  Diagnosis Date   Arthritis    Rheumatoid arthritis   Celiac disease    Chronic kidney disease    stage 3 from MD notes   COPD (chronic obstructive pulmonary disease) (HCC)    Dyspnea    with going up stairs   Family history of adverse reaction to anesthesia    father had hard time waking up   Headache    sinus headaches   Hot flashes    Hypertension    Iron deficiency anemia    Pneumonia    per patient "I have walking pneumonia"    PAST SURGICAL HISTORY:  Past Surgical History:  Procedure Laterality Date   COLONOSCOPY     ECTOPIC PREGNANCY SURGERY      x 2   IR IMAGING GUIDED PORT INSERTION  01/31/2022   REVERSE SHOULDER ARTHROPLASTY Right 02/02/2017   Procedure: RIGHT REVERSE SHOULDER ARTHROPLASTY;  Surgeon: Beverely Low, MD;  Location: [facility] OR;  Service: Orthopedics;  Laterality: Right;    HEMATOLOGY/ONCOLOGY HISTORY:  Oncology History Overview Note   Cancer Staging  CMML (chronic myelomonocytic leukemia) (HCC) Staging form: Chronic Myeloid Leukemia, AJCC 8th Edition - Clinical stage from 01/12/2022: Bone marrow blast count (%): 12, Additional clonal changes: Unknown - Signed by Malachy Mood, MD on 01/12/2022 Stage prefix: Initial diagnosis     CMML (chronic myelomonocytic leukemia) (HCC)  11/09/2021 Initial Biopsy   DIAGNOSIS:   -  Monoclonal B-cell population with co-expression of CD5 comprises 17%  of all lymphocytes  -  See comment   COMMENT:  In addition to the clonal B-cell population, there is a myeloblast  population (CD34, CD38, HLA-DR, CD117, CD123 and CD33) that comprises 2% of the total cellular events.  Please see concurrent tissue biopsy (below) for additional work-up and final diagnosis.    FINAL MICROSCOPIC DIAGNOSIS:   A. SOFT TISSUE MASS, PRE SACRAL, NEEDLE CORE BIOPSY:  -  Chronic lymphocytic leukemia/small lymphocytic lymphoma  -   Extra medullary hematopoiesis  -  See comment   COMMENT:  The biopsy consists of multiple soft tissue cores with lymphoid nodules and a dense hematopoietic infiltrate consistent with extra medullary hematopoiesis.  MPO and E-cadherin highlight myeloid and erythroid precursors respectively.  CD34 highlights increased vasculature and is also positive within the cytoplasm of megakaryocytes.  A few small, immature mononuclear cells appear to be positive for CD34 and CD117. TdT shows rare, scattered positive cells.  CD20 highlights aggregates of B cells which are admixed with CD3 positive T cells.  T cells are an admixture of CD4 and CD8.  The B cells are also positive for CD5, CD23 and Bcl-2.  The B cells do not show significant staining for CD10, BCL6 or cyclin D1.  CD138 highlights scattered plasma cells which are polytypic by kappa and lambda in situ hybridization.  Flow cytometry performed on the sample (see WL [id number]) identified a kappa restricted CD5 positive B-cell population comprising 70% of lymphocytes.  In addition, a small myeloblast population comprised 2% of the total cellular events.   Overall, the findings are consistent with soft tissue involvement by  chronic lymphocytic leukemia/small lymphocytic lymphoma and extra medullary hematopoiesis. In reviewing the patient's CBC data (macrocytic anemia and thrombocytopenia), I would recommend a bone marrow biopsy to assess for marrow involvement by CLL/SLL.    12/23/2021 Imaging   EXAM: CT CHEST, ABDOMEN, AND PELVIS WITH CONTRAST  IMPRESSION: 1. Slight interval enlargement of a presacral soft tissue mass measuring 7.2 x 4.7 cm, previously 6.9 x 4.1 cm on prior MR of the pelvis dated 07/04/2021. By report, this represents a biopsy proven lymphoma. 2. Pleural nodule of the dependent right lower lobe overlying the posterior right tenth rib and pleural or paraspinous soft tissue mass overlying the right aspect of the T10 vertebral body,  very slightly enlarged compared to prior examination of the chest dated 06/25/2020, consistent with additional sites of lymphomatous involvement given very indolent growth. These could be better assessed for metabolic activity by FDG PET/CT if desired. 3. There is mild, bibasilar predominant pulmonary fibrosis in a pattern featuring irregular peripheral interstitial opacity, septal thickening, but without clear evidence of subpleural bronchiolectasis or honeycombing, with a somewhat asymmetric distribution most conspicuously involving the right lower lobe and lingula. These findings are significantly worsened when compared to prior examination dated 06/25/2020, particularly in the right lower lobe. Given interval change, this may reflect sequelae of interval infection or aspiration, however appearance is generally suspicious for fibrotic interstitial lung disease, and if characterized by ATS pulmonary fibrosis is in an "indeterminate for UIP" pattern, differential considerations including both UIP and NSIP. 4. Emphysema.   Aortic Atherosclerosis (ICD10-I70.0) and Emphysema (ICD10-J43.9).   01/05/2022 Pathology Results   DIAGNOSIS:   BONE MARROW, ASPIRATE, CLOT, CORE:  -Hypercellular bone marrow for age with features of  myelodysplastic/myeloproliferative neoplasm  -Minor abnormal B-cell population  -See comment   PERIPHERAL BLOOD:  -Macrocytic anemia  -Neutrophilic left shift and monocytosis  -Thrombocytopenia   COMMENT:  The bone marrow is hypercellular for age with dyspoietic changes  involving myeloid cell lines  associated with monocytosis and increased number of blastic cells (12%) as primarily seen by morphology, many of which display monocytic features.  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  In this  background, there are several predominantly small lymphoid aggregates mostly composed of small lymphoid cells.  By flow cytometry, a minor abnormal B-cell population expressing CD5 is seen and representing 2% of all cells.  This correlate with previously known B-cell lymphoproliferative process.  Correlation with cytogenetic and FISH studies is strongly recommended.    DIAGNOSIS:   -Increased number of monocytic cells present (25%)  -Minor abnormal B-cell population identified.  -See comment   COMMENT:  Flow cytometric analysis shows increased number of monocytic cells representing 25% of all cells but without aberrant phenotype or CD34 expression.  A significant CD34-positive blastic population is not identified.  The lymphoid population shows a minor B-cell population representing 2% of all cells and expressing B-cell antigens including CD20 associated with CD5, CD200 and possibly dim kappa expression.  The latter findings are abnormal and correlate with previously known B-cell lymphoproliferative process.  No significant T-cell phenotypic abnormalities identified.    01/12/2022 Initial Diagnosis   CMML (chronic myelomonocytic leukemia) (HCC)   01/12/2022 Cancer Staging   Staging form: Chronic Myeloid Leukemia, AJCC 8th Edition - Clinical stage from 01/12/2022: Bone marrow blast count (%): 12, Additional clonal changes: Unknown - Signed by Malachy Mood, MD on 01/12/2022 Stage prefix: Initial diagnosis   02/06/2022 - 08/11/2022 Chemotherapy   Patient is on Treatment Plan : MYELODYSPLASIA  Azacitidine IV D1-7 q28d     02/06/2022 -  Chemotherapy   Patient is on Treatment Plan : MYELODYSPLASIA  Azacitidine IV D1-7 q28d     06/03/2023 Imaging   MR lumbar spine with and without contrast IMPRESSION: 1. Long-standing presacral mass, at least doubled in size since 2016, now at least 8 cm in length. There is internal fat components and differential considerations include myelolipoma, liposarcoma, and  teratoma. 2. 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 3. L2-3 left paracentral extrusion compressing the left L3 nerve root. 4. Right foraminal impingement at L3-4 to L4-5, greatest at L4-5. Right subarticular recess narrowing at L3-4 and L4-5.   07/09/2023 Imaging   CT abdomen and pelvis with contrast  IMPRESSION: No acute findings in the abdomen or pelvis. Specifically, no findings to explain the patient's history of pain. 2. Interval progression of presacral and right thoracic paraspinal soft tissue lesions compatible with mild progression of disease since 06/19/2022. Small posterior right thoracic chest wall lesion also mildly progressive. 3. Mild circumferential wall thickening distal esophagus. Esophagitis would be a consideration. 4.  Aortic Atherosclerosis (ICD10-I70.0).     ALLERGIES:  is allergic to digoxin and related, gluten meal, penicillins, fexofenadine, alendronate, and hydroxychloroquine.  MEDICATIONS:  Current Outpatient Medications  Medication Sig Dispense Refill   acetaminophen (TYLENOL) 500 MG tablet Take 2 tablets (1,000 mg total) by mouth 3 (three) times daily. 30 tablet 0   apixaban (ELIQUIS) 5 MG TABS tablet Take 1 tablet (5 mg total) by mouth 2 (two) times daily. 60 tablet 11   denosumab (PROLIA) 60 MG/ML SOSY injection Inject 60 mg into the skin every 6 (six) months.     diltiazem (CARDIZEM CD) 360 MG 24 hr capsule Take 1 capsule (360 mg total) by mouth daily. 90 capsule 3   furosemide (LASIX) 20 MG tablet Take 1 tablet (20  mg total) by mouth daily. 30 tablet 11   gabapentin (NEURONTIN) 300 MG capsule Take 300 mg by mouth in the morning.     [START ON 11/17/2023] HYDROmorphone (DILAUDID) 2 MG tablet Take 1-1.5 tablets (2-3 mg total) by mouth every 3 (three) hours as needed for severe pain (pain score 7-10) or moderate pain (pain score 4-6). 60 tablet 0   Metoprolol Tartrate 75 MG TABS Take 2 tablets (150 mg total) by mouth 2  (two) times daily. 360 tablet 2   naproxen (NAPROSYN) 375 MG tablet Take 1 tablet (375 mg total) by mouth 2 (two) times daily as needed. 40 tablet 0   predniSONE (DELTASONE) 5 MG tablet Take 5 mg by mouth daily with breakfast.     Tiotropium Bromide-Olodaterol (STIOLTO RESPIMAT) 2.5-2.5 MCG/ACT AERS Inhale 2 puffs into the lungs daily. 4 g 11   No current facility-administered medications for this visit.   Facility-Administered Medications Ordered in Other Visits  Medication Dose Route Frequency Provider Last Rate Last Admin   acetaminophen (TYLENOL) 325 MG tablet            diphenhydrAMINE (BENADRYL) 25 mg capsule             VITAL SIGNS: There were no vitals taken for this visit. There were no vitals filed for this visit.  Estimated body mass index is 17.96 kg/m as calculated from the following:   Height as of 11/12/23: 5\' 3"  (1.6 m).   Weight as of 11/12/23: 101 lb 6.6 oz (46 kg).  LABS: CBC:    Component Value Date/Time   WBC 8.2 11/14/2023 0406   HGB 9.9 (L) 11/14/2023 0406   HGB 12.2 11/12/2023 1436   HGB 10.8 (L) 01/03/2017 1246   HCT 32.0 (L) 11/14/2023 0406   HCT 32.8 (L) 01/03/2017 1246   PLT 177 11/14/2023 0406   PLT 222 11/12/2023 1436   PLT 163 01/03/2017 1246   MCV 110.0 (H) 11/14/2023 0406   MCV 102.8 (H) 01/03/2017 1246   NEUTROABS 7.4 11/12/2023 1436   NEUTROABS 6.7 (H) 01/03/2017 1246   LYMPHSABS 1.4 11/12/2023 1436   LYMPHSABS 2.9 01/03/2017 1246   MONOABS 1.4 (H) 11/12/2023 1436   MONOABS 1.1 (H) 01/03/2017 1246   EOSABS 0.1 11/12/2023 1436   EOSABS 0.1 01/03/2017 1246   BASOSABS 0.1 11/12/2023 1436   BASOSABS 0.0 01/03/2017 1246   Comprehensive Metabolic Panel:    Component Value Date/Time   NA 136 11/14/2023 0406   NA 141 01/04/2016 1001   K 4.3 11/14/2023 0406   K 4.5 01/04/2016 1001   CL 103 11/14/2023 0406   CO2 25 11/14/2023 0406   CO2 24 01/04/2016 1001   BUN 18 11/14/2023 0406   BUN 21.7 01/04/2016 1001   CREATININE 0.87  11/14/2023 0406   CREATININE 0.94 11/12/2023 1436   CREATININE 1.2 (H) 01/04/2016 1001   GLUCOSE 92 11/14/2023 0406   GLUCOSE 105 01/04/2016 1001   CALCIUM 9.1 11/14/2023 0406   CALCIUM 9.7 01/04/2016 1001   AST 24 11/12/2023 1542   AST 16 11/12/2023 1436   AST 26 01/04/2016 1001   ALT 12 11/12/2023 1542   ALT 10 11/12/2023 1436   ALT 24 01/04/2016 1001   ALKPHOS 110 11/12/2023 1542   ALKPHOS 61 01/04/2016 1001   BILITOT 1.1 11/12/2023 1542   BILITOT 0.8 11/12/2023 1436   BILITOT 0.41 01/04/2016 1001   PROT 7.2 11/12/2023 1542   PROT 7.0 01/04/2016 1001   ALBUMIN 4.1 11/12/2023 1542  ALBUMIN 4.0 01/04/2016 1001    RADIOGRAPHIC STUDIES: CT ABDOMEN PELVIS W CONTRAST  Result Date: 11/12/2023 CLINICAL DATA:  Left-sided back pain for several days, history of the key Mia EXAM: CT ABDOMEN AND PELVIS WITH CONTRAST TECHNIQUE: Multidetector CT imaging of the abdomen and pelvis was performed using the standard protocol following bolus administration of intravenous contrast. RADIATION DOSE REDUCTION: This exam was performed according to the departmental dose-optimization program which includes automated exposure control, adjustment of the mA and/or kV according to patient size and/or use of iterative reconstruction technique. CONTRAST:  OMNIPAQUE IOHEXOL 300 MG/ML  SOLN COMPARISON:  07/09/2023 FINDINGS: Lower chest: Small right pleural effusion is noted. Stable right paraspinal mass is seen although incompletely evaluated when compared with the prior examination. Enhancing lesion along the right lung base posteriorly within the pleural space is noted as well this is also stable from prior study. Hepatobiliary: No focal liver abnormality is seen. No gallstones, gallbladder wall thickening, or biliary dilatation. Pancreas: Unremarkable. No pancreatic ductal dilatation or surrounding inflammatory changes. Spleen: Normal in size without focal abnormality. Adrenals/Urinary Tract: Adrenal glands are  within normal limits. Kidneys demonstrate cystic change bilaterally worse on the right than the left. No follow-up is recommended as these are stable from the prior exam. The bladder is partially distended. Stomach/Bowel: Scattered diverticular change of the colon is noted. No obstructive or inflammatory changes of the colon are seen. The appendix is within normal limits. Small bowel and stomach are unremarkable. Vascular/Lymphatic: Aortic atherosclerosis. No enlarged abdominal or pelvic lymph nodes. Reproductive: Uterus and bilateral adnexa are unremarkable. Other: Presacral mass lesions are again identified similar to that seen on the prior study. They demonstrate mixed attenuation and are consistent with the given clinical history of underlying leukemia. Musculoskeletal: No acute or significant osseous findings. IMPRESSION: Stable soft tissue mass lesions in the presacral region as well as along the posterior right pleural margin and right paraspinal space. These findings are similar to that seen on prior examination consistent with the given clinical history. Diverticulosis without diverticulitis. Electronically Signed   By: Alcide Clever M.D.   On: 11/12/2023 19:58   DG Chest Port 1 View  Result Date: 11/12/2023 CLINICAL DATA:  Atrial fibrillation EXAM: PORTABLE CHEST 1 VIEW COMPARISON:  07/09/2023 FINDINGS: Right chest port remains in place. Mild cardiomegaly. Aortic atherosclerosis. No focal airspace consolidation, pleural effusion, or pneumothorax. Right shoulder prosthesis. IMPRESSION: No active disease. Electronically Signed   By: Duanne Guess D.O.   On: 11/12/2023 18:40   CT L-SPINE NO CHARGE  Result Date: 11/12/2023 CLINICAL DATA:  New left-sided back pain EXAM: CT LUMBAR SPINE WITHOUT CONTRAST TECHNIQUE: Multidetector CT imaging of the lumbar spine was performed without intravenous contrast administration. Multiplanar CT image reconstructions were also generated. RADIATION DOSE REDUCTION:  This exam was performed according to the departmental dose-optimization program which includes automated exposure control, adjustment of the mA and/or kV according to patient size and/or use of iterative reconstruction technique. COMPARISON:  Pelvis MR 10/12/23 FINDINGS: Segmentation: 5 lumbar type vertebrae. Alignment: Trace retrolisthesis of L1 on L2. There is right lateral listhesis of L3 on L4. Vertebrae: There is mild asymmetric height loss of the superior endplate of T12 (series 5, image 58). Redemonstrated heterogeneous appearance of the sacrum, in keeping with previously seen on bilateral sacral insufficiency fractures. Paraspinal and other soft tissues: Aortic atherosclerotic calcifications. Redemonstrated is a partially imaged presacral mass. Diverticulosis without diverticulitis. Disc levels: There is likely moderate to severe spinal canal narrowing at L3-L4. Severe right-sided  neural foraminal stenosis at L4-L5 and L5-S1 severe left-sided neural foraminal stenosis at L5-S1 IMPRESSION: 1. Mild asymmetric height loss of the superior endplate of T12, which is age indeterminate. Correlate with point tenderness. 2. Redemonstrated heterogeneous appearance of the sacrum, in keeping with previously seen bilateral sacral insufficiency fractures. 3. Redemonstrated partially imaged presacral mass. See same day CT abdomen/pelvis for further characterization. 4. Likely moderate to severe spinal canal narrowing at L3-L4. 5. Severe right-sided neural foraminal stenosis at L4-L5 and L5-S1 and severe left-sided neural foraminal stenosis at L5-S1. Aortic Atherosclerosis (ICD10-I70.0). Electronically Signed   By: Lorenza Cambridge M.D.   On: 11/12/2023 18:22    PERFORMANCE STATUS (ECOG) : 2 - Symptomatic, <50% confined to bed  Review of Systems  Constitutional:  Positive for activity change.  Musculoskeletal:  Positive for arthralgias.  Unless otherwise noted, a complete review of systems is  negative.  IMPRESSION: This is my initial visit with Mrs. Bolyard outpatient. I met with patient and her husband during her recent hospitalization on last week for symptom management. No acute distress reported. Her husband is present during call. Patient reports she is doing well overall since hospital discharge. Denies concerns with nausea, vomiting, constipation, or diarrhea. Her appetite is fair. Some days are better than others. We discussed the importance of nutrition and protein intake.   I introduced myself, Maygan RN, and Palliative's role in collaboration with the oncology team. Concept of Palliative Care was introduced as specialized medical care for people and their families living with serious illness.  It focuses on providing relief from the symptoms and stress of a serious illness.  The goal is to improve quality of life for both the patient and the family. Values and goals of care important to patient and family were attempted to be elicited.   We discussed Mrs. Spillers current sacral, lower back, and hip pain. Patient reports her pain is somewhat improved. She is taking hydromorphone 2-3 mg as needed every 3 hours with Tylenol ES scheduled every 8 hours. Tolerating regimen. Patient reports her pain is "better" and is currently "manageable". During her hospitalization we discussed realistic expectations, cancer pain etiology, disease trajectory, and set short-term goal that was comfortable for patient in regard to her pain. Margo at that time expressed her goal was to maintain a consistent pain level of no more than 5 on the pain scale if at all possible. Today when asked during the call she reports her pain level is a 4 which is within her expectations. Patient shares she is sleeping more during the day recently. She was taking hydromorphone every 3-4 hours however over the past 24-48 hours she has decreased usage to only 2-3 times daily. She does not want to "overdo" her medications. I  encouraged patient to allow herself some grace with understanding she is being closely managed. She verbalized understanding.   The spouse also discusses the patient's pain management, indicating that the patient may be underutilizing her pain medication. Mr. Duboise suggests that the patient should be the ultimate judge of her pain levels and whether her current medication regimen is sufficient. The spouse also emphasizes the importance of encouraging the patient to eat more.  Education provided on the use of long-acting medication for additional pain relief support in addition to possible interventional procedures such as spinal/bone injections per IR. A referral for evaluation has been placed. Patient and husband verbalized understanding and appreciation. At this time, she would like to continue on current regimen and request to further  discuss use of long-acting medications at next appointment allowing time for potential need.   I discussed the importance of continued conversation with family and their medical providers regarding overall plan of care and treatment options, ensuring decisions are within the context of the patients values and GOCs.  PLAN: Established therapeutic relationship. Education provided on palliative's role in collaboration with their Oncology/Radiation team.  Cancer Related Pain Management   Mrs. Buruca reports pain is controlled on current regimen. Pain level reported as level 4 on 10 pain scale. Patient's husband reports that she may be underutilizing her pain medication (Hydromorphone). Discussed the option of adding a long-acting pain medication for more consistent pain control.   -Continue hydromorphone 2-3mg  every three hours as needed for pain -Tylenol ES every 8 hours -Discuss the addition of a long-acting pain medication at the next appointment on November 26, 2023.    Nutrition   Patient's husband reports concern about her eating habits and is encouraging her  to eat more.   -Continue to encourage patient to eat more for overall health and recovery.  Health Maintenance -Patient knows to contact office if needed -I will plan to see patient in clinic on December 2nd after visit with Dr. Mosetta Putt.   Patient expressed understanding and was in agreement with this plan. She also understands that She can call the clinic at any time with any questions, concerns, or complaints.   Thank you for your referral and allowing Palliative to assist in Mrs. Renee Rival Bobby's care.   Number and complexity of problems addressed: HIGH - 1 or more chronic illnesses with SEVERE exacerbation, progression, or side effects of treatment - advanced cancer, pain. Any controlled substances utilized were prescribed in the context of palliative care.   Visit consisted of counseling and education dealing with the complex and emotionally intense issues of symptom management and palliative care in the setting of serious and potentially life-threatening illness.  Signed by: Willette Alma, AGPCNP-BC Palliative Medicine Team/[facility]   *Please note that this is a verbal dictation therefore any spelling or grammatical errors are due to the "Dragon Medical One" system interpretation.

## 2023-11-19 ENCOUNTER — Ambulatory Visit: Payer: Medicare Other | Admitting: Hematology

## 2023-11-19 ENCOUNTER — Inpatient Hospital Stay: Payer: Medicare Other | Admitting: Nurse Practitioner

## 2023-11-19 ENCOUNTER — Ambulatory Visit: Payer: Medicare Other

## 2023-11-19 ENCOUNTER — Encounter: Payer: Self-pay | Admitting: Nurse Practitioner

## 2023-11-19 ENCOUNTER — Inpatient Hospital Stay: Payer: Medicare Other

## 2023-11-19 DIAGNOSIS — C9310 Chronic myelomonocytic leukemia not having achieved remission: Secondary | ICD-10-CM | POA: Diagnosis not present

## 2023-11-19 DIAGNOSIS — R530 Neoplastic (malignant) related fatigue: Secondary | ICD-10-CM | POA: Diagnosis not present

## 2023-11-19 DIAGNOSIS — G893 Neoplasm related pain (acute) (chronic): Secondary | ICD-10-CM

## 2023-11-19 DIAGNOSIS — Z515 Encounter for palliative care: Secondary | ICD-10-CM | POA: Diagnosis not present

## 2023-11-20 ENCOUNTER — Ambulatory Visit: Payer: Medicare Other

## 2023-11-20 ENCOUNTER — Encounter: Payer: Self-pay | Admitting: Hematology

## 2023-11-20 DIAGNOSIS — C9310 Chronic myelomonocytic leukemia not having achieved remission: Secondary | ICD-10-CM | POA: Diagnosis not present

## 2023-11-20 DIAGNOSIS — N1831 Chronic kidney disease, stage 3a: Secondary | ICD-10-CM | POA: Diagnosis not present

## 2023-11-20 DIAGNOSIS — I5022 Chronic systolic (congestive) heart failure: Secondary | ICD-10-CM | POA: Diagnosis not present

## 2023-11-20 DIAGNOSIS — I482 Chronic atrial fibrillation, unspecified: Secondary | ICD-10-CM | POA: Diagnosis not present

## 2023-11-20 DIAGNOSIS — I130 Hypertensive heart and chronic kidney disease with heart failure and stage 1 through stage 4 chronic kidney disease, or unspecified chronic kidney disease: Secondary | ICD-10-CM | POA: Diagnosis not present

## 2023-11-20 DIAGNOSIS — D630 Anemia in neoplastic disease: Secondary | ICD-10-CM | POA: Diagnosis not present

## 2023-11-21 ENCOUNTER — Ambulatory Visit: Payer: Medicare Other

## 2023-11-23 ENCOUNTER — Ambulatory Visit: Payer: Medicare Other

## 2023-11-23 NOTE — Progress Notes (Unsigned)
Palliative Medicine [facility]  [phone] [phone]   Name: [name] Date: 11/23/2023 MRN: [id number]  DOB: 10-25-1946  Patient Care Team: Sherry Bowl, MD as PCP - General (Internal Medicine) [name], Deloris Ping, MD as PCP - Cardiology (Cardiology) Sherry Jordan, MD as Consulting Physician (Rheumatology) Sherry Mood, MD as Consulting Physician (Hematology) Sherry Elizabeth, MD as Consulting Physician (Gastroenterology) Sherry Brooms Rachel Bo, DO as Consulting Physician (Pulmonary Disease) Pickenpack-Cousar, Arty Baumgartner, NP as Nurse Practitioner (Hospice and Palliative Medicine)    INTERVAL HISTORY: [name] is a 77 y.o. 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  Palliative ask to see for symptom management and goals of care.     SOCIAL HISTORY:     reports that she quit smoking about 19 years ago. Her smoking use included cigarettes. She started smoking about 49 years ago. She has a 30 pack-year smoking history. She has never used smokeless tobacco. She reports current alcohol use of about 12.0 - 14.0 standard drinks of alcohol per week. She reports that she does not use drugs.  ADVANCE DIRECTIVES:  None on file  CODE STATUS: Full code  PAST MEDICAL HISTORY: Past Medical History:  Diagnosis Date   Arthritis    Rheumatoid arthritis   Celiac disease    Chronic kidney disease    stage 3 from MD notes   COPD (chronic obstructive pulmonary disease) (HCC)    Dyspnea    with going up stairs   Family history of adverse reaction to anesthesia    father had hard time waking up   Headache    sinus headaches   Hot flashes    Hypertension    Iron deficiency anemia    Pneumonia    per patient "I have walking pneumonia"    ALLERGIES:  is allergic to digoxin and related, gluten meal, penicillins, fexofenadine, alendronate, and  hydroxychloroquine.  MEDICATIONS:  Current Outpatient Medications  Medication Sig Dispense Refill   acetaminophen (TYLENOL) 500 MG tablet Take 2 tablets (1,000 mg total) by mouth 3 (three) times daily. 30 tablet 0   apixaban (ELIQUIS) 5 MG TABS tablet Take 1 tablet (5 mg total) by mouth 2 (two) times daily. 60 tablet 11   denosumab (PROLIA) 60 MG/ML SOSY injection Inject 60 mg into the skin every 6 (six) months.     diltiazem (CARDIZEM CD) 360 MG 24 hr capsule Take 1 capsule (360 mg total) by mouth daily. 90 capsule 3   furosemide (LASIX) 20 MG tablet Take 1 tablet (20 mg total) by mouth daily. 30 tablet 11   gabapentin (NEURONTIN) 300 MG capsule Take 300 mg by mouth in the morning.     HYDROmorphone (DILAUDID) 2 MG tablet Take 1-1.5 tablets (2-3 mg total) by mouth every 3 (three) hours as needed for severe pain (pain score 7-10) or moderate pain (pain score 4-6). 60 tablet 0   Metoprolol Tartrate 75 MG TABS Take 2 tablets (150 mg total) by mouth 2 (two) times daily. 360 tablet 2   naproxen (NAPROSYN) 375 MG tablet Take 1 tablet (375 mg total) by mouth 2 (two) times daily as needed. 40 tablet 0   predniSONE (DELTASONE) 5 MG tablet Take 5 mg by mouth daily with breakfast.     Tiotropium Bromide-Olodaterol (STIOLTO RESPIMAT) 2.5-2.5 MCG/ACT AERS Inhale 2 puffs into the lungs daily. 4 g 11   No current facility-administered medications for this visit.  Facility-Administered Medications Ordered in Other Visits  Medication Dose Route Frequency Provider Last Rate Last Admin   acetaminophen (TYLENOL) 325 MG tablet            diphenhydrAMINE (BENADRYL) 25 mg capsule             VITAL SIGNS: There were no vitals taken for this visit. There were no vitals filed for this visit.  Estimated body mass index is 17.96 kg/m as calculated from the following:   Height as of 11/12/23: 5\' 3"  (1.6 m).   Weight as of 11/12/23: 101 lb 6.6 oz (46 kg).   PERFORMANCE STATUS (ECOG) : 2 - Symptomatic, <50%  confined to bed Discussed the use of AI scribe software for clinical note transcription with the patient, who gave verbal consent to proceed.   Physical Exam General: NAD, in wheelchair  Cardiovascular: regular rate and rhythm Pulmonary: normal breathing pattern Extremities: left ankle edema  Skin: no rashes Neurological: AAO x3  IMPRESSION:  History of Present Illness [name] presents to clinic for symptom management follow-up. 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 She describes her pain as being most severe in the morning, often necessitating the use of hydromorphone and Tylenol. However, she notes that the pain subsides to a level of zero throughout the rest of the day, even during physical activity. This is a marked change from her previous state, where pain was a constant issue.  [name] is also managing her pain with regular physical exercises, performed twice daily. She reports no increase in pain during or after these exercises. Despite this improvement, the patient has experienced several setbacks, often resulting in hospital admissions. These setbacks appear to occur around the time of her infusion treatments, which she reports can leave her feeling weak for a couple of days. She also reports using a walker for mobility at home and has been managing her personal hygiene independently, although she finds standing in the shower for extended periods challenging. This has limited her to avoiding showers. [name] shares they have a handheld shower nozzle and shower bench available for support if patient would like to attempt showering with support. We discussed this may be an opportunity to explore with the physical therapist in upcoming weeks. Husband reports recent home PT evaluation with scheduled in-home sessions to begin tomorrow.  We discussed importance of listening to her body and considering medicating prior to therapy if needed.    Education provided on the use of long-acting pain medication for additional pain control if pain was to increase. We discussed the use of extended release versus immediate release medication and benefits. We also discussed possible interventional procedures to assist with pain management. At this time Mrs. Khanam feels current regimen is sufficient given she is only requiring hydromorphone 1-2 times daily with occasional use of Tylenol.   In addition to her pain management, the patient has been experiencing significant weight loss. She reports a decrease in appetite and changes in taste, possibly due to her medications. Despite consuming two meals a day and a daily Boost protein drink, she has continued to lose weight. She also notes that she has celiac disease, which restricts her diet to gluten-free foods. Foods have become limited and with manufacturing changes which also poses limitations in her food options. The patient acknowledges that her dietary intake may not be sufficient and plans  to keep a food diary to better understand her nutritional intake. We discussed tracking her intake over the next week allowing for a more accurate view of her caloric and protein intake. We will then discuss and can consider additional needed support. Mrs. Coulton shares she may also benefit from going to the grocery store herself as she has not been able to do so over the past several months due to her health challenges. This may allow her the opportunity to see a variety of food options that may appease her appetite. We discussed utilizing the motorized shopping scooter for support.  Overall, the patient's condition appears to have improved, with a significant reduction in pain levels and an active approach to managing her physical health. However, her weight loss and dietary concerns, along with the potential  for future setbacks, indicate a need for ongoing monitoring and support.  I discussed the importance of continued conversation with family and their medical providers regarding overall plan of care and treatment options, ensuring decisions are within the context of the patients values and GOCs.  PLAN: Cancer-related Pain Pain is well controlled with hydromorphone and Tylenol. Discussed the option of long-acting pain medication, Xtampza, for future consideration if pain control becomes an issue. -Continue current pain management regimen with hydromorphone and Tylenol as needed. -Consider Xtampza if pain control becomes an issue in the future. -Will defer interventional options for pain management at this time. However, we will continue to closely monitor for pain needs.   Weight Loss Patient has been losing weight and has a decreased appetite. Discussed the importance of small frequent meals and protein intake. -Keep a food diary for one week to assess caloric and protein intake. -Consider increasing Ensure to twice a day, in between meals. -Plan for a phone call in one week to discuss food diary and make dietary adjustments as needed.  Hypotension Blood pressure is low (90/48), possibly due to metoprolol, Cardizem, and Lasix. -Hold afternoon dose of metoprolol. -Check blood pressure in the morning before taking metoprolol. Discussed parameters to hold medication. Patient is asymptomatic.   Edema Noted swelling in the left leg. Patient is currently taking Lasix for fluid retention. -Monitor blood pressure closely due to potential hypotensive effect of Lasix.  Physical Therapy Patient is starting physical therapy and is currently doing exercises twice a day at home. -Continue with physical therapy twice weekly and home exercises.  Follow-up Plan for a phone call in one week to discuss food diary and any changes in patient's condition.   Patient expressed understanding and was in  agreement with this plan. She also understands that She can call the clinic at any time with any questions, concerns, or complaints.   Any controlled substances utilized were prescribed in the context of palliative care. PDMP has been reviewed.    Visit consisted of counseling and education dealing with the complex and emotionally intense issues of symptom management and palliative care in the setting of serious and potentially life-threatening illness.  Willette Alma, AGPCNP-BC  Palliative Medicine Team/[facility]

## 2023-11-24 NOTE — Assessment & Plan Note (Signed)
diagnosed in 10/2021 ---She developed worsening back pain, pelvic MRI 07/04/21 showed a mass in the presacral space. 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  -She began azacitadine on 02/06/22. she has had multiple hospital admissions and her performance status has been very low.  Changed her chemo to every 6 weeks in 08/2022 due to her fatigue. -she is tolerating every 6 weeks much better -she saw Dr. Sharyne Richters in Feb 2024, who recommends to continue current therapy  -Due to worsening low back pain, she completed palliative radiation on 07/02/2023 -due to worsening low back pain, she was hospitalized on November 12, 2023.  Currently follow-up with palliative care for pain control.

## 2023-11-26 ENCOUNTER — Encounter: Payer: Self-pay | Admitting: Hematology

## 2023-11-26 ENCOUNTER — Encounter: Payer: Self-pay | Admitting: Nurse Practitioner

## 2023-11-26 ENCOUNTER — Inpatient Hospital Stay (HOSPITAL_BASED_OUTPATIENT_CLINIC_OR_DEPARTMENT_OTHER): Payer: Medicare Other | Admitting: Hematology

## 2023-11-26 ENCOUNTER — Inpatient Hospital Stay (HOSPITAL_BASED_OUTPATIENT_CLINIC_OR_DEPARTMENT_OTHER): Payer: Medicare Other | Admitting: Nurse Practitioner

## 2023-11-26 ENCOUNTER — Inpatient Hospital Stay: Payer: Medicare Other

## 2023-11-26 ENCOUNTER — Inpatient Hospital Stay: Payer: Medicare Other | Attending: Nurse Practitioner

## 2023-11-26 ENCOUNTER — Ambulatory Visit: Payer: Medicare Other

## 2023-11-26 VITALS — BP 90/48

## 2023-11-26 VITALS — BP 98/56 | HR 89 | Temp 97.7°F | Resp 18

## 2023-11-26 VITALS — BP 93/58 | HR 78 | Temp 97.6°F | Resp 16 | Wt 104.2 lb

## 2023-11-26 DIAGNOSIS — Z515 Encounter for palliative care: Secondary | ICD-10-CM

## 2023-11-26 DIAGNOSIS — Z95828 Presence of other vascular implants and grafts: Secondary | ICD-10-CM

## 2023-11-26 DIAGNOSIS — C9310 Chronic myelomonocytic leukemia not having achieved remission: Secondary | ICD-10-CM

## 2023-11-26 DIAGNOSIS — Z7189 Other specified counseling: Secondary | ICD-10-CM | POA: Diagnosis not present

## 2023-11-26 DIAGNOSIS — Z5111 Encounter for antineoplastic chemotherapy: Secondary | ICD-10-CM | POA: Insufficient documentation

## 2023-11-26 DIAGNOSIS — R634 Abnormal weight loss: Secondary | ICD-10-CM

## 2023-11-26 DIAGNOSIS — G893 Neoplasm related pain (acute) (chronic): Secondary | ICD-10-CM

## 2023-11-26 DIAGNOSIS — R530 Neoplastic (malignant) related fatigue: Secondary | ICD-10-CM | POA: Diagnosis not present

## 2023-11-26 DIAGNOSIS — Z79899 Other long term (current) drug therapy: Secondary | ICD-10-CM | POA: Insufficient documentation

## 2023-11-26 DIAGNOSIS — R630 Anorexia: Secondary | ICD-10-CM

## 2023-11-26 LAB — CBC WITH DIFFERENTIAL (CANCER CENTER ONLY)
Abs Immature Granulocytes: 0.29 10*3/uL — ABNORMAL HIGH (ref 0.00–0.07)
Basophils Absolute: 0.2 10*3/uL — ABNORMAL HIGH (ref 0.0–0.1)
Basophils Relative: 1 %
Eosinophils Absolute: 0.4 10*3/uL (ref 0.0–0.5)
Eosinophils Relative: 3 %
HCT: 31.8 % — ABNORMAL LOW (ref 36.0–46.0)
Hemoglobin: 9.9 g/dL — ABNORMAL LOW (ref 12.0–15.0)
Immature Granulocytes: 2 %
Lymphocytes Relative: 7 %
Lymphs Abs: 1 10*3/uL (ref 0.7–4.0)
MCH: 32.8 pg (ref 26.0–34.0)
MCHC: 31.1 g/dL (ref 30.0–36.0)
MCV: 105.3 fL — ABNORMAL HIGH (ref 80.0–100.0)
Monocytes Absolute: 2 10*3/uL — ABNORMAL HIGH (ref 0.1–1.0)
Monocytes Relative: 14 %
Neutro Abs: 10.8 10*3/uL — ABNORMAL HIGH (ref 1.7–7.7)
Neutrophils Relative %: 73 %
Platelet Count: 259 10*3/uL (ref 150–400)
RBC: 3.02 MIL/uL — ABNORMAL LOW (ref 3.87–5.11)
RDW: 14.3 % (ref 11.5–15.5)
WBC Count: 14.7 10*3/uL — ABNORMAL HIGH (ref 4.0–10.5)
nRBC: 0 % (ref 0.0–0.2)

## 2023-11-26 LAB — BASIC METABOLIC PANEL - CANCER CENTER ONLY
Anion gap: 8 (ref 5–15)
BUN: 19 mg/dL (ref 8–23)
CO2: 30 mmol/L (ref 22–32)
Calcium: 9.5 mg/dL (ref 8.9–10.3)
Chloride: 101 mmol/L (ref 98–111)
Creatinine: 0.93 mg/dL (ref 0.44–1.00)
GFR, Estimated: >60 mL/min (ref >60)
Glucose, Bld: 147 mg/dL — ABNORMAL HIGH (ref 70–99)
Potassium: 4.1 mmol/L (ref 3.5–5.1)
Sodium: 139 mmol/L (ref 135–145)

## 2023-11-26 MED ORDER — SODIUM CHLORIDE 0.9 % IV SOLN: Freq: Once | INTRAVENOUS | Status: AC

## 2023-11-26 MED ORDER — SODIUM CHLORIDE 0.9% FLUSH
10.0000 mL | INTRAVENOUS | Status: DC | PRN
  Administered 2023-11-26: 10 mL

## 2023-11-26 MED ORDER — SODIUM CHLORIDE 0.9 % IV SOLN
75.0000 mg/m2 | Freq: Once | INTRAVENOUS | Status: AC
  Administered 2023-11-26: 114 mg via INTRAVENOUS
  Filled 2023-11-26: qty 11.4

## 2023-11-26 MED ORDER — HYDROMORPHONE HCL 2 MG PO TABS
2.0000 mg | ORAL_TABLET | ORAL | 0 refills | Status: DC | PRN
Start: 2023-11-26 — End: 2024-01-07

## 2023-11-26 MED ORDER — SODIUM CHLORIDE 0.9% FLUSH
10.0000 mL | Freq: Once | INTRAVENOUS | Status: AC
  Administered 2023-11-26: 10 mL

## 2023-11-26 MED ORDER — ONDANSETRON HCL 4 MG/2ML IJ SOLN
8.0000 mg | Freq: Once | INTRAMUSCULAR | Status: AC
Start: 2023-11-26 — End: 2023-11-26
  Administered 2023-11-26: 8 mg via INTRAVENOUS
  Filled 2023-11-26: qty 4

## 2023-11-26 MED ORDER — HEPARIN SOD (PORK) LOCK FLUSH 100 UNIT/ML IV SOLN
500.0000 [IU] | Freq: Once | INTRAVENOUS | Status: AC | PRN
  Administered 2023-11-26: 500 [IU]

## 2023-11-26 NOTE — Progress Notes (Signed)
[facility]   Telephone:([phone]) [phone] [phone]) [phone]   Clinic Follow up Note   Patient Care Team: [name], MD as PCP - General (Internal Medicine) [name], Deloris Ping, MD as PCP - Cardiology (Cardiology) Zenovia Jordan, MD as Consulting Physician (Rheumatology) Malachy Mood, MD as Consulting Physician (Hematology) Charna Elizabeth, MD as Consulting Physician (Gastroenterology) Josephine Igo, DO as Consulting Physician (Pulmonary Disease) Pickenpack-Cousar, Arty Baumgartner, NP as Nurse Practitioner [facility])  Date of Service:  11/26/2023  CHIEF COMPLAINT: f/u of CMML  CURRENT THERAPY:  Azacitidine day 1-5 every 6 weeks  Oncology History   CMML (chronic myelomonocytic leukemia) (HCC) diagnosed in 10/2021 ---She developed worsening back pain, pelvic MRI 07/04/21 showed a mass in the presacral space. 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 Her BM biopsy was reviewed at [facility] and was felt to be CMML-1 with 5% blasts.  -She began azacitadine on 02/06/22. she has had multiple hospital admissions and her performance status has been very low.  Changed her chemo to every 6 weeks in 08/2022 due to her fatigue. -she is tolerating every 6 weeks much better -she saw Dr. Sharyne Richters in Feb 2024, who recommends to continue current therapy  -Due to worsening low back pain, she completed palliative radiation on 07/02/2023 -due to worsening low back pain, she was hospitalized on November 12, 2023.  Currently follow-up with palliative care for pain control.    Assessment and Plan  CMML -Continue azacitidine, current cycle was postponed for week due to her severe pain required hospital admission    Back Pain due to Tumor and Fracture Severe back pain attributed to a tumor in presacral area and  a fracture. Current pain management with medication is effective but may not be sufficient long-term. Discussed potential future options such as a pain pump if needed. Emphasized the importance of medication management and follow-up with pain management specialist Nikki. The tumor is not operable and pain may eventually worsen. - Continue current pain medication - Discuss long-acting pain medication options with palliative care NP Nikki - Consider pain pump if pain becomes unmanageable  Anemia Hemoglobin level is 9.9, indicating mild anemia. No new symptoms such as fever or significant fatigue reported. Platelet count is normal. - Restart treatment today - Monitor hemoglobin levels  Hypertension Blood pressure is unusually low today. On metoprolol and Cardizem, both of which can lower blood pressure. No dizziness reported. Advised to monitor blood pressure at home. Will check blood pressure in the infusion room and administer IV fluids if necessary. - Monitor blood pressure at home - Check blood pressure in the infusion room - Administer IV fluids if blood pressure remains low  General Health Maintenance Left foot swelling has been persistent for 7-8 weeks. Appetite is okay but not feeling hungry. - Monitor for new symptoms - Ensure adequate fluid and nutritional intake  Plan -Lab reviewed, adequate for treatment, will proceed azacitidine this week, and continue every 6 weeks - Follow up in three weeks with nurse practitioner - Schedule next cycle in six weeks.         SUMMARY OF ONCOLOGIC HISTORY: Oncology History Overview Note   Cancer Staging  CMML (chronic myelomonocytic leukemia) (HCC) Staging form: Chronic Myeloid Leukemia, AJCC 8th Edition - Clinical stage from 01/12/2022: Bone marrow blast count (%): 12, Additional clonal changes: Unknown - Signed by Malachy Mood, MD  on 01/12/2022 Stage prefix: Initial diagnosis     CMML (chronic myelomonocytic leukemia) (HCC)  11/09/2021  Initial Biopsy   DIAGNOSIS:   -  Monoclonal B-cell population with co-expression of CD5 comprises 17%  of all lymphocytes  -  See comment   COMMENT:  In addition to the clonal B-cell population, there is a myeloblast  population (CD34, CD38, HLA-DR, CD117, CD123 and CD33) that comprises 2% of the total cellular events.  Please see concurrent tissue biopsy (below) for additional work-up and final diagnosis.    FINAL MICROSCOPIC DIAGNOSIS:   A. SOFT TISSUE MASS, PRE SACRAL, NEEDLE CORE BIOPSY:  -  Chronic lymphocytic leukemia/small lymphocytic lymphoma  -  Extra medullary hematopoiesis  -  See comment   COMMENT:  The biopsy consists of multiple soft tissue cores with lymphoid nodules and a dense hematopoietic infiltrate consistent with extra medullary hematopoiesis.  MPO and E-cadherin highlight myeloid and erythroid precursors respectively.  CD34 highlights increased vasculature and is also positive within the cytoplasm of megakaryocytes.  A few small, immature mononuclear cells appear to be positive for CD34 and CD117. TdT shows rare, scattered positive cells.  CD20 highlights aggregates of B cells which are admixed with CD3 positive T cells.  T cells are an admixture of CD4 and CD8.  The B cells are also positive for CD5, CD23 and Bcl-2.  The B cells do not show significant staining for CD10, BCL6 or cyclin D1.  CD138 highlights scattered plasma cells which are polytypic by kappa and lambda in situ hybridization.  Flow cytometry performed on the sample (see WL [id number]) identified a kappa restricted CD5 positive B-cell population comprising 70% of lymphocytes.  In addition, a small myeloblast population comprised 2% of the total cellular events.   Overall, the findings are consistent with soft tissue involvement by  chronic lymphocytic leukemia/small lymphocytic lymphoma and extra medullary hematopoiesis. In reviewing the patient's CBC data (macrocytic anemia and thrombocytopenia), I would  recommend a bone marrow biopsy to assess for marrow involvement by CLL/SLL.    12/23/2021 Imaging   EXAM: CT CHEST, ABDOMEN, AND PELVIS WITH CONTRAST  IMPRESSION: 1. Slight interval enlargement of a presacral soft tissue mass measuring 7.2 x 4.7 cm, previously 6.9 x 4.1 cm on prior MR of the pelvis dated 07/04/2021. By report, this represents a biopsy proven lymphoma. 2. Pleural nodule of the dependent right lower lobe overlying the posterior right tenth rib and pleural or paraspinous soft tissue mass overlying the right aspect of the T10 vertebral body, very slightly enlarged compared to prior examination of the chest dated 06/25/2020, consistent with additional sites of lymphomatous involvement given very indolent growth. These could be better assessed for metabolic activity by FDG PET/CT if desired. 3. There is mild, bibasilar predominant pulmonary fibrosis in a pattern featuring irregular peripheral interstitial opacity, septal thickening, but without clear evidence of subpleural bronchiolectasis or honeycombing, with a somewhat asymmetric distribution most conspicuously involving the right lower lobe and lingula. These findings are significantly worsened when compared to prior examination dated 06/25/2020, particularly in the right lower lobe. Given interval change, this may reflect sequelae of interval infection or aspiration, however appearance is generally suspicious for fibrotic interstitial lung disease, and if characterized by ATS pulmonary fibrosis is in an "indeterminate for UIP" pattern, differential considerations including both UIP and NSIP. 4. Emphysema.   Aortic Atherosclerosis (ICD10-I70.0) and Emphysema (ICD10-J43.9).   01/05/2022 Pathology Results   DIAGNOSIS:   BONE MARROW, ASPIRATE, CLOT, CORE:  -Hypercellular bone marrow for age  with features of  myelodysplastic/myeloproliferative neoplasm  -Minor abnormal B-cell population  -See comment   PERIPHERAL  BLOOD:  -Macrocytic anemia  -Neutrophilic left shift and monocytosis  -Thrombocytopenia   COMMENT:  The bone marrow is hypercellular for age with dyspoietic changes  involving myeloid cell lines associated with monocytosis and increased number of blastic cells (12%) as primarily seen by morphology, many of which display monocytic features.  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  In this background, there are several predominantly small lymphoid aggregates mostly composed of small lymphoid cells.  By flow cytometry, a minor abnormal B-cell population expressing CD5 is seen and representing 2% of all cells.  This correlate with previously known B-cell lymphoproliferative process.  Correlation with cytogenetic and FISH studies is strongly recommended.    DIAGNOSIS:   -Increased number of monocytic cells present (25%)  -Minor abnormal B-cell population identified.  -See comment   COMMENT:  Flow cytometric analysis shows increased number of monocytic cells representing 25% of all cells but without aberrant phenotype or CD34 expression.  A significant CD34-positive blastic population is not identified.  The lymphoid population shows a minor B-cell population representing 2% of all cells and expressing B-cell antigens including CD20 associated with CD5, CD200 and possibly dim kappa expression.  The latter findings are abnormal and correlate with previously known B-cell lymphoproliferative process.  No significant T-cell phenotypic abnormalities identified.    01/12/2022 Initial Diagnosis   CMML (chronic myelomonocytic leukemia) (HCC)   01/12/2022 Cancer Staging   Staging form: Chronic Myeloid Leukemia, AJCC 8th Edition - Clinical stage from 01/12/2022: Bone marrow blast count (%): 12, Additional clonal changes: Unknown -  Signed by Malachy Mood, MD on 01/12/2022 Stage prefix: Initial diagnosis   02/06/2022 - 08/11/2022 Chemotherapy   Patient is on Treatment Plan : MYELODYSPLASIA  Azacitidine IV D1-7 q28d     02/06/2022 -  Chemotherapy   Patient is on Treatment Plan : MYELODYSPLASIA  Azacitidine IV D1-7 q28d     06/03/2023 Imaging   MR lumbar spine with and without contrast IMPRESSION: 1. Long-standing presacral mass, at least doubled in size since 2016, now at least 8 cm in length. There is internal fat components and differential considerations include myelolipoma, liposarcoma, and teratoma. 2. 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 3. L2-3 left paracentral extrusion compressing the left L3 nerve root. 4. Right foraminal impingement at L3-4 to L4-5, greatest at L4-5. Right subarticular recess narrowing at L3-4 and L4-5.   07/09/2023 Imaging   CT abdomen and pelvis with contrast  IMPRESSION: No acute findings in the abdomen or pelvis. Specifically, no findings to explain the patient's history of pain. 2. Interval progression of presacral and right thoracic paraspinal soft tissue lesions compatible with mild progression of disease since 06/19/2022. Small posterior right thoracic chest wall lesion also mildly progressive. 3. Mild circumferential wall thickening distal esophagus. Esophagitis would be a consideration. 4.  Aortic Atherosclerosis (ICD10-I70.0).      Discussed the use of AI scribe software for clinical note transcription with the patient, who gave verbal consent to proceed.  History of Present Illness   The patient, with a history of a pelvic tumor and a tailbone fracture, presents with persistent back pain. She reports that the pain is constant and severe, despite being mostly sedentary and performing exercises. The pain is managed with medication, which has been effective thus far. However, she  expresses concern about the longevity of the medication's  effectiveness.  In addition to the back pain, the patient has been experiencing swelling in her left foot for the past seven to eight weeks. She denies any new symptoms, cough, or stomach pain. She reports that her appetite is normal, but she does not often feel hungry.  The patient also mentions that she has been monitoring her blood pressure at home, which has been normal, but is unusually low today. She is currently taking metoprolol and Cardizem for blood pressure management.         All other systems were reviewed with the patient and are negative.  MEDICAL HISTORY:  Past Medical History:  Diagnosis Date   Arthritis    Rheumatoid arthritis   Celiac disease    Chronic kidney disease    stage 3 from MD notes   COPD (chronic obstructive pulmonary disease) (HCC)    Dyspnea    with going up stairs   Family history of adverse reaction to anesthesia    father had hard time waking up   Headache    sinus headaches   Hot flashes    Hypertension    Iron deficiency anemia    Pneumonia    per patient "I have walking pneumonia"    SURGICAL HISTORY: Past Surgical History:  Procedure Laterality Date   COLONOSCOPY     ECTOPIC PREGNANCY SURGERY      x 2   IR IMAGING GUIDED PORT INSERTION  01/31/2022   REVERSE SHOULDER ARTHROPLASTY Right 02/02/2017   Procedure: RIGHT REVERSE SHOULDER ARTHROPLASTY;  Surgeon: Beverely Low, MD;  Location: [facility] OR;  Service: Orthopedics;  Laterality: Right;    I have reviewed the social history and family history with the patient and they are unchanged from previous note.  ALLERGIES:  is allergic to digoxin and related, gluten meal, penicillins, fexofenadine, alendronate, and hydroxychloroquine.  MEDICATIONS:  Current Outpatient Medications  Medication Sig Dispense Refill   acetaminophen (TYLENOL) 500 MG tablet Take 2 tablets (1,000 mg total) by mouth 3 (three) times daily. 30 tablet 0   apixaban (ELIQUIS) 5 MG TABS tablet Take 1 tablet (5 mg total) by  mouth 2 (two) times daily. 60 tablet 11   denosumab (PROLIA) 60 MG/ML SOSY injection Inject 60 mg into the skin every 6 (six) months.     diltiazem (CARDIZEM CD) 360 MG 24 hr capsule Take 1 capsule (360 mg total) by mouth daily. 90 capsule 3   furosemide (LASIX) 20 MG tablet Take 1 tablet (20 mg total) by mouth daily. 30 tablet 11   gabapentin (NEURONTIN) 300 MG capsule Take 300 mg by mouth in the morning.     HYDROmorphone (DILAUDID) 2 MG tablet Take 1-1.5 tablets (2-3 mg total) by mouth every 3 (three) hours as needed for severe pain (pain score 7-10) or moderate pain (pain score 4-6). 60 tablet 0   Metoprolol Tartrate 75 MG TABS Take 2 tablets (150 mg total) by mouth 2 (two) times daily. 360 tablet 2   naproxen (NAPROSYN) 375 MG tablet Take 1 tablet (375 mg total) by mouth 2 (two) times daily as needed. 40 tablet 0   predniSONE (DELTASONE) 5 MG tablet Take 5 mg by mouth daily with breakfast.     Tiotropium Bromide-Olodaterol (STIOLTO RESPIMAT) 2.5-2.5 MCG/ACT AERS Inhale 2 puffs into the lungs daily. 4 g 11   No current facility-administered medications for this visit.   Facility-Administered Medications Ordered in Other Visits  Medication Dose Route Frequency Provider  Last Rate Last Admin   acetaminophen (TYLENOL) 325 MG tablet            diphenhydrAMINE (BENADRYL) 25 mg capsule            sodium chloride flush (NS) 0.9 % injection 10 mL  10 mL Intracatheter PRN Malachy Mood, MD   10 mL at 11/26/23 1131    PHYSICAL EXAMINATION: ECOG PERFORMANCE STATUS: 3 - Symptomatic, >50% confined to bed  Vitals:   11/26/23 0852 11/26/23 1001  BP: (!) 85/53 (!) 93/58  Pulse: 78   Resp: 16   Temp: 97.6 F (36.4 C)   SpO2: 100%    Wt Readings from Last 3 Encounters:  11/26/23 104 lb 3.2 oz (47.3 kg)  11/12/23 101 lb 6.6 oz (46 kg)  11/12/23 103 lb (46.7 kg)     GENERAL:alert, no distress and comfortable SKIN: skin color, texture, turgor are normal, no rashes or significant lesions EYES:  normal, Conjunctiva are pink and non-injected, sclera clear NECK: supple, thyroid normal size, non-tender, without nodularity LYMPH:  no palpable lymphadenopathy in the cervical, axillary  LUNGS: clear to auscultation and percussion with normal breathing effort HEART: regular rate & rhythm and no murmurs and no lower extremity edema ABDOMEN:abdomen soft, non-tender and normal bowel sounds Musculoskeletal:no cyanosis of digits and no clubbing, (+) bilateral ankle edema NEURO: alert & oriented x 3 with fluent speech, no focal motor/sensory deficits  LABORATORY DATA:  I have reviewed the data as listed    Latest Ref Rng & Units 11/26/2023    8:26 AM 11/14/2023    4:06 AM 11/13/2023    2:55 AM  CBC  WBC 4.0 - 10.5 K/uL 14.7  8.2  10.9   Hemoglobin 12.0 - 15.0 g/dL 9.9  9.9  9.5   Hematocrit 36.0 - 46.0 % 31.8  32.0  30.4   Platelets 150 - 400 K/uL 259  177  163         Latest Ref Rng & Units 11/26/2023    8:26 AM 11/14/2023    4:06 AM 11/13/2023    2:55 AM  CMP  Glucose 70 - 99 mg/dL 161  92  096   BUN 8 - 23 mg/dL 19  18  25    Creatinine 0.44 - 1.00 mg/dL 0.45  4.09  8.11   Sodium 135 - 145 mmol/L 139  136  131   Potassium 3.5 - 5.1 mmol/L 4.1  4.3  3.4   Chloride 98 - 111 mmol/L 101  103  97   CO2 22 - 32 mmol/L 30  25  27    Calcium 8.9 - 10.3 mg/dL 9.5  9.1  8.4       RADIOGRAPHIC STUDIES: I have personally reviewed the radiological images as listed and agreed with the findings in the report. No results found.    Orders Placed This Encounter  Procedures   CBC with Differential (Cancer Center Only)    Standing Status:   Future    Standing Expiration Date:   01/06/2025   Basic Metabolic Panel - Cancer Center Only    Standing Status:   Future    Standing Expiration Date:   01/06/2025   CBC with Differential (Cancer Center Only)    Standing Status:   Future    Standing Expiration Date:   02/17/2025   Basic Metabolic Panel - Cancer Center Only    Standing Status:    Future    Standing Expiration Date:   02/17/2025   All  questions were answered. The patient knows to call the clinic with any problems, questions or concerns. No barriers to learning was detected. The total time spent in the appointment was 25 minutes.     Malachy Mood, MD 11/26/2023

## 2023-11-26 NOTE — Patient Instructions (Signed)
[facility]  Discharge Instructions: Thank you for choosing [facility] to provide your oncology and hematology care.   If you have a lab appointment with the Cancer Center, please go directly to the Cancer Center and check in at the registration area.   Wear comfortable clothing and clothing appropriate for easy access to any Portacath or PICC line.   We strive to give you quality time with your provider. You may need to reschedule your appointment if you arrive late (15 or more minutes).  Arriving late affects you and other patients whose appointments are after yours.  Also, if you miss three or more appointments without notifying the office, you may be dismissed from the clinic at the provider's discretion.      For prescription refill requests, have your pharmacy contact our office and allow 72 hours for refills to be completed.    Today you received the following chemotherapy and/or immunotherapy agents vidaza      To help prevent nausea and vomiting after your treatment, we encourage you to take your nausea medication as directed.  BELOW ARE SYMPTOMS THAT SHOULD BE REPORTED IMMEDIATELY: *FEVER GREATER THAN 100.4 F (38 C) OR HIGHER *CHILLS OR SWEATING *NAUSEA AND VOMITING THAT IS NOT CONTROLLED WITH YOUR NAUSEA MEDICATION *UNUSUAL SHORTNESS OF BREATH *UNUSUAL BRUISING OR BLEEDING *URINARY PROBLEMS (pain or burning when urinating, or frequent urination) *BOWEL PROBLEMS (unusual diarrhea, constipation, pain near the anus) TENDERNESS IN MOUTH AND THROAT WITH OR WITHOUT PRESENCE OF ULCERS (sore throat, sores in mouth, or a toothache) UNUSUAL RASH, SWELLING OR PAIN  UNUSUAL VAGINAL DISCHARGE OR ITCHING   Items with * indicate a potential emergency and should be followed up as soon as possible or go to the Emergency Department if any problems should occur.  Please show the CHEMOTHERAPY ALERT CARD or IMMUNOTHERAPY  ALERT CARD at check-in to the Emergency Department and triage nurse.  Should you have questions after your visit or need to cancel or reschedule your appointment, please contact [facility]  Dept: [phone]  and follow the prompts.  Office hours are 8:00 a.m. to 4:30 p.m. Monday - Friday. Please note that voicemails left after 4:00 p.m. may not be returned until the following business day.  We are closed weekends and major holidays. You have access to a nurse at all times for urgent questions. Please call the main number to the clinic Dept: [phone] and follow the prompts.   For any non-urgent questions, you may also contact your provider using MyChart. We now offer e-Visits for anyone 32 and older to request care online for non-urgent symptoms. For details visit [email or web address].   Also download the MyChart app! Go to the app store, search "MyChart", open the app, select [place], and log in with your MyChart username and password.

## 2023-11-27 ENCOUNTER — Inpatient Hospital Stay: Payer: Medicare Other

## 2023-11-27 VITALS — BP 130/81 | HR 98 | Temp 97.7°F | Resp 18 | Wt 103.8 lb

## 2023-11-27 DIAGNOSIS — I482 Chronic atrial fibrillation, unspecified: Secondary | ICD-10-CM | POA: Diagnosis not present

## 2023-11-27 DIAGNOSIS — N1831 Chronic kidney disease, stage 3a: Secondary | ICD-10-CM | POA: Diagnosis not present

## 2023-11-27 DIAGNOSIS — D630 Anemia in neoplastic disease: Secondary | ICD-10-CM | POA: Diagnosis not present

## 2023-11-27 DIAGNOSIS — C9310 Chronic myelomonocytic leukemia not having achieved remission: Secondary | ICD-10-CM

## 2023-11-27 DIAGNOSIS — I5022 Chronic systolic (congestive) heart failure: Secondary | ICD-10-CM | POA: Diagnosis not present

## 2023-11-27 DIAGNOSIS — I130 Hypertensive heart and chronic kidney disease with heart failure and stage 1 through stage 4 chronic kidney disease, or unspecified chronic kidney disease: Secondary | ICD-10-CM | POA: Diagnosis not present

## 2023-11-27 DIAGNOSIS — Z79899 Other long term (current) drug therapy: Secondary | ICD-10-CM | POA: Diagnosis not present

## 2023-11-27 DIAGNOSIS — Z5111 Encounter for antineoplastic chemotherapy: Secondary | ICD-10-CM | POA: Diagnosis not present

## 2023-11-27 MED ORDER — SODIUM CHLORIDE 0.9 % IV SOLN: Freq: Once | INTRAVENOUS | Status: AC

## 2023-11-27 MED ORDER — SODIUM CHLORIDE 0.9% FLUSH
10.0000 mL | INTRAVENOUS | Status: DC | PRN
  Administered 2023-11-27: 10 mL

## 2023-11-27 MED ORDER — HEPARIN SOD (PORK) LOCK FLUSH 100 UNIT/ML IV SOLN
500.0000 [IU] | Freq: Once | INTRAVENOUS | Status: AC | PRN
  Administered 2023-11-27: 500 [IU]

## 2023-11-27 MED ORDER — SODIUM CHLORIDE 0.9 % IV SOLN
75.0000 mg/m2 | Freq: Once | INTRAVENOUS | Status: AC
  Administered 2023-11-27: 114 mg via INTRAVENOUS
  Filled 2023-11-27: qty 11.4

## 2023-11-27 MED ORDER — ONDANSETRON HCL 4 MG/2ML IJ SOLN
8.0000 mg | Freq: Once | INTRAMUSCULAR | Status: AC
  Administered 2023-11-27: 8 mg via INTRAVENOUS
  Filled 2023-11-27: qty 4

## 2023-11-27 NOTE — Addendum Note (Signed)
Addended by: Glee Arvin on: 11/27/2023 02:55 PM   Modules accepted: Orders

## 2023-11-28 ENCOUNTER — Inpatient Hospital Stay: Payer: Medicare Other

## 2023-11-28 VITALS — BP 127/80 | HR 99 | Temp 98.1°F | Resp 17

## 2023-11-28 DIAGNOSIS — C9310 Chronic myelomonocytic leukemia not having achieved remission: Secondary | ICD-10-CM

## 2023-11-28 DIAGNOSIS — Z5111 Encounter for antineoplastic chemotherapy: Secondary | ICD-10-CM | POA: Diagnosis not present

## 2023-11-28 DIAGNOSIS — Z79899 Other long term (current) drug therapy: Secondary | ICD-10-CM | POA: Diagnosis not present

## 2023-11-28 MED ORDER — SODIUM CHLORIDE 0.9 % IV SOLN: Freq: Once | INTRAVENOUS | Status: AC

## 2023-11-28 MED ORDER — ONDANSETRON HCL 4 MG/2ML IJ SOLN
8.0000 mg | Freq: Once | INTRAMUSCULAR | Status: AC
  Administered 2023-11-28: 8 mg via INTRAVENOUS
  Filled 2023-11-28: qty 4

## 2023-11-28 MED ORDER — HEPARIN SOD (PORK) LOCK FLUSH 100 UNIT/ML IV SOLN
500.0000 [IU] | Freq: Once | INTRAVENOUS | Status: AC | PRN
  Administered 2023-11-28: 500 [IU]

## 2023-11-28 MED ORDER — SODIUM CHLORIDE 0.9 % IV SOLN
75.0000 mg/m2 | Freq: Once | INTRAVENOUS | Status: AC
  Administered 2023-11-28: 114 mg via INTRAVENOUS
  Filled 2023-11-28: qty 11.4

## 2023-11-28 MED ORDER — SODIUM CHLORIDE 0.9% FLUSH
10.0000 mL | INTRAVENOUS | Status: DC | PRN
  Administered 2023-11-28: 10 mL

## 2023-11-28 NOTE — Patient Instructions (Signed)
[facility]  Discharge Instructions: Thank you for choosing [facility] to provide your oncology and hematology care.   If you have a lab appointment with the Cancer Center, please go directly to the Cancer Center and check in at the registration area.   Wear comfortable clothing and clothing appropriate for easy access to any Portacath or PICC line.   We strive to give you quality time with your provider. You may need to reschedule your appointment if you arrive late (15 or more minutes).  Arriving late affects you and other patients whose appointments are after yours.  Also, if you miss three or more appointments without notifying the office, you may be dismissed from the clinic at the provider's discretion.      For prescription refill requests, have your pharmacy contact our office and allow 72 hours for refills to be completed.    Today you received the following chemotherapy and/or immunotherapy agents Vidaza      To help prevent nausea and vomiting after your treatment, we encourage you to take your nausea medication as directed.  BELOW ARE SYMPTOMS THAT SHOULD BE REPORTED IMMEDIATELY: *FEVER GREATER THAN 100.4 F (38 C) OR HIGHER *CHILLS OR SWEATING *NAUSEA AND VOMITING THAT IS NOT CONTROLLED WITH YOUR NAUSEA MEDICATION *UNUSUAL SHORTNESS OF BREATH *UNUSUAL BRUISING OR BLEEDING *URINARY PROBLEMS (pain or burning when urinating, or frequent urination) *BOWEL PROBLEMS (unusual diarrhea, constipation, pain near the anus) TENDERNESS IN MOUTH AND THROAT WITH OR WITHOUT PRESENCE OF ULCERS (sore throat, sores in mouth, or a toothache) UNUSUAL RASH, SWELLING OR PAIN  UNUSUAL VAGINAL DISCHARGE OR ITCHING   Items with * indicate a potential emergency and should be followed up as soon as possible or go to the Emergency Department if any problems should occur.  Please show the CHEMOTHERAPY ALERT CARD or IMMUNOTHERAPY  ALERT CARD at check-in to the Emergency Department and triage nurse.  Should you have questions after your visit or need to cancel or reschedule your appointment, please contact [facility]  Dept: [phone]  and follow the prompts.  Office hours are 8:00 a.m. to 4:30 p.m. Monday - Friday. Please note that voicemails left after 4:00 p.m. may not be returned until the following business day.  We are closed weekends and major holidays. You have access to a nurse at all times for urgent questions. Please call the main number to the clinic Dept: [phone] and follow the prompts.   For any non-urgent questions, you may also contact your provider using MyChart. We now offer e-Visits for anyone 72 and older to request care online for non-urgent symptoms. For details visit [email or web address].   Also download the MyChart app! Go to the app store, search "MyChart", open the app, select [place], and log in with your MyChart username and password.

## 2023-11-29 ENCOUNTER — Inpatient Hospital Stay: Payer: Medicare Other

## 2023-11-29 ENCOUNTER — Other Ambulatory Visit: Payer: Self-pay

## 2023-11-29 VITALS — BP 124/76 | HR 98 | Temp 97.6°F | Resp 16

## 2023-11-29 DIAGNOSIS — I130 Hypertensive heart and chronic kidney disease with heart failure and stage 1 through stage 4 chronic kidney disease, or unspecified chronic kidney disease: Secondary | ICD-10-CM | POA: Diagnosis not present

## 2023-11-29 DIAGNOSIS — Z5111 Encounter for antineoplastic chemotherapy: Secondary | ICD-10-CM | POA: Diagnosis not present

## 2023-11-29 DIAGNOSIS — C9310 Chronic myelomonocytic leukemia not having achieved remission: Secondary | ICD-10-CM | POA: Diagnosis not present

## 2023-11-29 DIAGNOSIS — Z79899 Other long term (current) drug therapy: Secondary | ICD-10-CM | POA: Diagnosis not present

## 2023-11-29 DIAGNOSIS — I482 Chronic atrial fibrillation, unspecified: Secondary | ICD-10-CM | POA: Diagnosis not present

## 2023-11-29 DIAGNOSIS — I5022 Chronic systolic (congestive) heart failure: Secondary | ICD-10-CM | POA: Diagnosis not present

## 2023-11-29 DIAGNOSIS — D630 Anemia in neoplastic disease: Secondary | ICD-10-CM | POA: Diagnosis not present

## 2023-11-29 DIAGNOSIS — N1831 Chronic kidney disease, stage 3a: Secondary | ICD-10-CM | POA: Diagnosis not present

## 2023-11-29 MED ORDER — HEPARIN SOD (PORK) LOCK FLUSH 100 UNIT/ML IV SOLN
500.0000 [IU] | Freq: Once | INTRAVENOUS | Status: AC | PRN
  Administered 2023-11-29: 500 [IU]

## 2023-11-29 MED ORDER — ONDANSETRON HCL 4 MG/2ML IJ SOLN
8.0000 mg | Freq: Once | INTRAMUSCULAR | Status: AC
  Administered 2023-11-29: 8 mg via INTRAVENOUS
  Filled 2023-11-29: qty 4

## 2023-11-29 MED ORDER — SODIUM CHLORIDE 0.9 % IV SOLN
75.0000 mg/m2 | Freq: Once | INTRAVENOUS | Status: AC
  Administered 2023-11-29: 114 mg via INTRAVENOUS
  Filled 2023-11-29: qty 11.4

## 2023-11-29 MED ORDER — SODIUM CHLORIDE 0.9 % IV SOLN: Freq: Once | INTRAVENOUS | Status: AC

## 2023-11-29 MED ORDER — SODIUM CHLORIDE 0.9% FLUSH
10.0000 mL | INTRAVENOUS | Status: DC | PRN
  Administered 2023-11-29: 10 mL

## 2023-11-30 ENCOUNTER — Inpatient Hospital Stay: Payer: Medicare Other

## 2023-11-30 VITALS — BP 130/90 | HR 94 | Temp 97.7°F | Resp 16

## 2023-11-30 DIAGNOSIS — C9310 Chronic myelomonocytic leukemia not having achieved remission: Secondary | ICD-10-CM

## 2023-11-30 DIAGNOSIS — Z5111 Encounter for antineoplastic chemotherapy: Secondary | ICD-10-CM | POA: Diagnosis not present

## 2023-11-30 DIAGNOSIS — Z79899 Other long term (current) drug therapy: Secondary | ICD-10-CM | POA: Diagnosis not present

## 2023-11-30 MED ORDER — ONDANSETRON HCL 4 MG/2ML IJ SOLN
8.0000 mg | Freq: Once | INTRAMUSCULAR | Status: AC
  Administered 2023-11-30: 8 mg via INTRAVENOUS
  Filled 2023-11-30: qty 4

## 2023-11-30 MED ORDER — SODIUM CHLORIDE 0.9 % IV SOLN
75.0000 mg/m2 | Freq: Once | INTRAVENOUS | Status: AC
  Administered 2023-11-30: 114 mg via INTRAVENOUS
  Filled 2023-11-30: qty 11.4

## 2023-11-30 MED ORDER — HEPARIN SOD (PORK) LOCK FLUSH 100 UNIT/ML IV SOLN
500.0000 [IU] | Freq: Once | INTRAVENOUS | Status: AC | PRN
  Administered 2023-11-30: 500 [IU]

## 2023-11-30 MED ORDER — SODIUM CHLORIDE 0.9 % IV SOLN: Freq: Once | INTRAVENOUS | Status: AC

## 2023-11-30 MED ORDER — SODIUM CHLORIDE 0.9% FLUSH
10.0000 mL | INTRAVENOUS | Status: DC | PRN
Start: 2023-11-30 — End: 2023-11-30
  Administered 2023-11-30: 10 mL

## 2023-11-30 NOTE — Patient Instructions (Signed)
 [facility] - A DEPT OF [facility]  Discharge Instructions: Thank you for choosing [facility] to provide your oncology and hematology care.   If you have a lab appointment with the Cancer Center, please go directly to the Cancer Center and check in at the registration area.   Wear comfortable clothing and clothing appropriate for easy access to any Portacath or PICC line.   We strive to give you quality time with your provider. You may need to reschedule your appointment if you arrive late (15 or more minutes).  Arriving late affects you and other patients whose appointments are after yours.  Also, if you miss three or more appointments without notifying the office, you may be dismissed from the clinic at the provider's discretion.      For prescription refill requests, have your pharmacy contact our office and allow 72 hours for refills to be completed.    Today you received the following chemotherapy and/or immunotherapy agents Vidaza      To help prevent nausea and vomiting after your treatment, we encourage you to take your nausea medication as directed.  BELOW ARE SYMPTOMS THAT SHOULD BE REPORTED IMMEDIATELY: *FEVER GREATER THAN 100.4 F (38 C) OR HIGHER *CHILLS OR SWEATING *NAUSEA AND VOMITING THAT IS NOT CONTROLLED WITH YOUR NAUSEA MEDICATION *UNUSUAL SHORTNESS OF BREATH *UNUSUAL BRUISING OR BLEEDING *URINARY PROBLEMS (pain or burning when urinating, or frequent urination) *BOWEL PROBLEMS (unusual diarrhea, constipation, pain near the anus) TENDERNESS IN MOUTH AND THROAT WITH OR WITHOUT PRESENCE OF ULCERS (sore throat, sores in mouth, or a toothache) UNUSUAL RASH, SWELLING OR PAIN  UNUSUAL VAGINAL DISCHARGE OR ITCHING   Items with * indicate a potential emergency and should be followed up as soon as possible or go to the Emergency Department if any problems should occur.  Please show the CHEMOTHERAPY ALERT CARD or IMMUNOTHERAPY  ALERT CARD at check-in to the Emergency Department and triage nurse.  Should you have questions after your visit or need to cancel or reschedule your appointment, please contact [facility] - A DEPT OF [facility]  Dept: [phone]  and follow the prompts.  Office hours are 8:00 a.m. to 4:30 p.m. Monday - Friday. Please note that voicemails left after 4:00 p.m. may not be returned until the following business day.  We are closed weekends and major holidays. You have access to a nurse at all times for urgent questions. Please call the main number to the clinic Dept: [phone] and follow the prompts.   For any non-urgent questions, you may also contact your provider using MyChart. We now offer e-Visits for anyone 72 and older to request care online for non-urgent symptoms. For details visit [email or web address].   Also download the MyChart app! Go to the app store, search "MyChart", open the app, select [place], and log in with your MyChart username and password.

## 2023-11-30 NOTE — Progress Notes (Unsigned)
Palliative Medicine [facility]  Telephone:([phone]) [phone] [phone]) [phone]   Name: [name] Date: 11/30/2023 MRN: [id number]  DOB: Sep 13, 1946  Patient Care Team: [name], MD as PCP - General (Internal Medicine) [name], Deloris Ping, MD as PCP - Cardiology (Cardiology) Zenovia Jordan, MD as Consulting Physician (Rheumatology) Malachy Mood, MD as Consulting Physician (Hematology) Charna Elizabeth, MD as Consulting Physician (Gastroenterology) Josephine Igo, DO as Consulting Physician (Pulmonary Disease) Pickenpack-Cousar, Arty Baumgartner, NP as Nurse Practitioner [facility] and Palliative Medicine)   I connected with [name] on 11/20/23 at  2:00 PM EST by telephone and verified that I am speaking with the correct person using two identifiers.  I discussed the limitations, risks, security and privacy concerns of performing an evaluation and management service by telemedicine and the availability of in-person appointments. I also discussed with the patient that there may be a patient responsible charge related to this service. The patient expressed understanding and agreed to proceed.   Other persons participating in the visit and their role in the encounter: Marilynne Drivers (Husband)   Patient's location: Home  Provider's location: [facility]   Chief Complaint: Pain Management    INTERVAL HISTORY: [name] is a 77 y.o. 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  Palliative ask to see for symptom management and goals of care.     SOCIAL HISTORY:     reports that she quit smoking about 19 years ago. Her smoking use included cigarettes. She started smoking about 49 years ago. She has a 30 pack-year smoking history. She has never used smokeless tobacco. She reports current alcohol use of about 12.0 - 14.0 standard drinks of alcohol per week. She reports that she does not use  drugs.  ADVANCE DIRECTIVES:  None on file  CODE STATUS: Full code  PAST MEDICAL HISTORY: Past Medical History:  Diagnosis Date   Arthritis    Rheumatoid arthritis   Celiac disease    Chronic kidney disease    stage 3 from MD notes   COPD (chronic obstructive pulmonary disease) (HCC)    Dyspnea    with going up stairs   Family history of adverse reaction to anesthesia    father had hard time waking up   Headache    sinus headaches   Hot flashes    Hypertension    Iron deficiency anemia    Pneumonia    per patient "I have walking pneumonia"    ALLERGIES:  is allergic to digoxin and related, gluten meal, penicillins, fexofenadine, alendronate, and hydroxychloroquine.  MEDICATIONS:  Current Outpatient Medications  Medication Sig Dispense Refill   acetaminophen (TYLENOL) 500 MG tablet Take 2 tablets (1,000 mg total) by mouth 3 (three) times daily. 30 tablet 0   apixaban (ELIQUIS) 5 MG TABS tablet Take 1 tablet (5 mg total) by mouth 2 (two) times daily. 60 tablet 11   denosumab (PROLIA) 60 MG/ML SOSY injection Inject 60 mg into the skin every 6 (six) months.     diltiazem (CARDIZEM CD) 360 MG 24 hr capsule Take 1 capsule (360 mg total) by mouth daily. 90 capsule 3   furosemide (LASIX) 20 MG tablet Take 1 tablet (20 mg total) by mouth daily. 30 tablet 11   gabapentin (NEURONTIN) 300 MG capsule Take 300 mg by mouth in the morning.     HYDROmorphone (DILAUDID) 2 MG tablet Take 1-1.5 tablets (2-3 mg total) by  mouth every 3 (three) hours as needed for severe pain (pain score 7-10) or moderate pain (pain score 4-6). 60 tablet 0   Metoprolol Tartrate 75 MG TABS Take 2 tablets (150 mg total) by mouth 2 (two) times daily. 360 tablet 2   naproxen (NAPROSYN) 375 MG tablet Take 1 tablet (375 mg total) by mouth 2 (two) times daily as needed. 40 tablet 0   predniSONE (DELTASONE) 5 MG tablet Take 5 mg by mouth daily with breakfast.     Tiotropium Bromide-Olodaterol (STIOLTO RESPIMAT) 2.5-2.5  MCG/ACT AERS Inhale 2 puffs into the lungs daily. 4 g 11   No current facility-administered medications for this visit.   Facility-Administered Medications Ordered in Other Visits  Medication Dose Route Frequency Provider Last Rate Last Admin   acetaminophen (TYLENOL) 325 MG tablet            diphenhydrAMINE (BENADRYL) 25 mg capsule            sodium chloride flush (NS) 0.9 % injection 10 mL  10 mL Intracatheter PRN Malachy Mood, MD   10 mL at 11/30/23 1619    VITAL SIGNS: There were no vitals taken for this visit. There were no vitals filed for this visit.  Estimated body mass index is 18.38 kg/m as calculated from the following:   Height as of 11/12/23: 5\' 3"  (1.6 m).   Weight as of 11/27/23: 103 lb 12 oz (47.1 kg).   PERFORMANCE STATUS (ECOG) : 2 - Symptomatic, <50% confined to bed Discussed the use of AI scribe software for clinical note transcription with the patient, who gave verbal consent to proceed.   Physical Exam General: NAD, in wheelchair  Cardiovascular: regular rate and rhythm Pulmonary: normal breathing pattern Extremities: left ankle edema  Skin: no rashes Neurological: AAO x3  IMPRESSION:  History of Present Illness [name] presents to clinic for symptom management follow-up. 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 She describes her pain as being most severe in the morning, often necessitating the use of hydromorphone and Tylenol. However, she notes that the pain subsides to a level of zero throughout the rest of the day, even during physical activity. This is a marked change from her previous state, where pain was a constant issue.  [name] is also managing her pain with regular physical exercises, performed twice daily. She reports no increase in pain during or after these exercises. Despite this improvement,  the patient has experienced several setbacks, often resulting in hospital admissions. These setbacks appear to occur around the time of her infusion treatments, which she reports can leave her feeling weak for a couple of days. She also reports using a walker for mobility at home and has been managing her personal hygiene independently, although she finds standing in the shower for extended periods challenging. This has limited her to avoiding showers. [name] shares they have a handheld shower nozzle and shower bench available for support if patient would like to attempt showering with support. We discussed this may be an opportunity to explore with the physical therapist in upcoming weeks. Husband reports recent home PT evaluation with scheduled in-home sessions to begin tomorrow. We discussed importance of listening to her body and considering medicating prior to therapy if needed.    Education provided on the use of long-acting pain medication for additional pain control if pain was to increase.  We discussed the use of extended release versus immediate release medication and benefits. We also discussed possible interventional procedures to assist with pain management. At this time [name] feels current regimen is sufficient given she is only requiring hydromorphone 1-2 times daily with occasional use of Tylenol.   In addition to her pain management, the patient has been experiencing significant weight loss. She reports a decrease in appetite and changes in taste, possibly due to her medications. Despite consuming two meals a day and a daily Boost protein drink, she has continued to lose weight. She also notes that she has celiac disease, which restricts her diet to gluten-free foods. Foods have become limited and with manufacturing changes which also poses limitations in her food options. The patient acknowledges that her dietary intake may not be sufficient and plans to keep a food diary to better  understand her nutritional intake. We discussed tracking her intake over the next week allowing for a more accurate view of her caloric and protein intake. We will then discuss and can consider additional needed support. [name] shares she may also benefit from going to the grocery store herself as she has not been able to do so over the past several months due to her health challenges. This may allow her the opportunity to see a variety of food options that may appease her appetite. We discussed utilizing the motorized shopping scooter for support.  Overall, the patient's condition appears to have improved, with a significant reduction in pain levels and an active approach to managing her physical health. However, her weight loss and dietary concerns, along with the potential for future setbacks, indicate a need for ongoing monitoring and support.  I discussed the importance of continued conversation with family and their medical providers regarding overall plan of care and treatment options, ensuring decisions are within the context of the patients values and GOCs.  PLAN: Cancer-related Pain Pain is well controlled with hydromorphone and Tylenol. Discussed the option of long-acting pain medication, Xtampza, for future consideration if pain control becomes an issue. -Continue current pain management regimen with hydromorphone and Tylenol as needed. -Consider Xtampza if pain control becomes an issue in the future. -Will defer interventional options for pain management at this time. However, we will continue to closely monitor for pain needs.   Weight Loss Patient has been losing weight and has a decreased appetite. Discussed the importance of small frequent meals and protein intake. -Keep a food diary for one week to assess caloric and protein intake. -Consider increasing Ensure to twice a day, in between meals. -Plan for a phone call in one week to discuss food diary and make dietary  adjustments as needed.  Hypotension Blood pressure is low (90/48), possibly due to metoprolol, Cardizem, and Lasix. -Hold afternoon dose of metoprolol. -Check blood pressure in the morning before taking metoprolol. Discussed parameters to hold medication. Patient is asymptomatic.   Edema Noted swelling in the left leg. Patient is currently taking Lasix for fluid retention. -Monitor blood pressure closely due to potential hypotensive effect of Lasix.  Physical Therapy Patient is starting physical therapy and is currently doing exercises twice a day at home. -Continue with physical therapy twice weekly and home exercises.  Follow-up Plan for a phone call in one week to discuss food diary and any changes in patient's condition.   Patient expressed understanding and was in agreement with this plan. She also understands that She can call the clinic at any time with any questions,  concerns, or complaints.   Any controlled substances utilized were prescribed in the context of palliative care. PDMP has been reviewed.    Visit consisted of counseling and education dealing with the complex and emotionally intense issues of symptom management and palliative care in the setting of serious and potentially life-threatening illness.  Willette Alma, AGPCNP-BC  Palliative Medicine Team/[facility]

## 2023-12-03 ENCOUNTER — Inpatient Hospital Stay: Payer: Medicare Other | Admitting: Nurse Practitioner

## 2023-12-03 ENCOUNTER — Encounter: Payer: Self-pay | Admitting: Nurse Practitioner

## 2023-12-03 DIAGNOSIS — I129 Hypertensive chronic kidney disease with stage 1 through stage 4 chronic kidney disease, or unspecified chronic kidney disease: Secondary | ICD-10-CM | POA: Diagnosis not present

## 2023-12-03 DIAGNOSIS — J449 Chronic obstructive pulmonary disease, unspecified: Secondary | ICD-10-CM | POA: Diagnosis not present

## 2023-12-03 DIAGNOSIS — M810 Age-related osteoporosis without current pathological fracture: Secondary | ICD-10-CM | POA: Diagnosis not present

## 2023-12-03 DIAGNOSIS — Z515 Encounter for palliative care: Secondary | ICD-10-CM | POA: Diagnosis not present

## 2023-12-03 DIAGNOSIS — G893 Neoplasm related pain (acute) (chronic): Secondary | ICD-10-CM

## 2023-12-03 DIAGNOSIS — C9310 Chronic myelomonocytic leukemia not having achieved remission: Secondary | ICD-10-CM | POA: Diagnosis not present

## 2023-12-03 DIAGNOSIS — M069 Rheumatoid arthritis, unspecified: Secondary | ICD-10-CM | POA: Diagnosis not present

## 2023-12-03 DIAGNOSIS — D638 Anemia in other chronic diseases classified elsewhere: Secondary | ICD-10-CM | POA: Diagnosis not present

## 2023-12-03 DIAGNOSIS — R1909 Other intra-abdominal and pelvic swelling, mass and lump: Secondary | ICD-10-CM | POA: Diagnosis not present

## 2023-12-03 DIAGNOSIS — E7800 Pure hypercholesterolemia, unspecified: Secondary | ICD-10-CM | POA: Diagnosis not present

## 2023-12-03 DIAGNOSIS — Z0000 Encounter for general adult medical examination without abnormal findings: Secondary | ICD-10-CM | POA: Diagnosis not present

## 2023-12-03 DIAGNOSIS — R630 Anorexia: Secondary | ICD-10-CM | POA: Diagnosis not present

## 2023-12-03 DIAGNOSIS — N1831 Chronic kidney disease, stage 3a: Secondary | ICD-10-CM | POA: Diagnosis not present

## 2023-12-03 DIAGNOSIS — K900 Celiac disease: Secondary | ICD-10-CM | POA: Diagnosis not present

## 2023-12-03 DIAGNOSIS — D693 Immune thrombocytopenic purpura: Secondary | ICD-10-CM | POA: Diagnosis not present

## 2023-12-03 DIAGNOSIS — D509 Iron deficiency anemia, unspecified: Secondary | ICD-10-CM | POA: Diagnosis not present

## 2023-12-04 ENCOUNTER — Encounter: Payer: Self-pay | Admitting: Hematology

## 2023-12-05 DIAGNOSIS — N1831 Chronic kidney disease, stage 3a: Secondary | ICD-10-CM | POA: Diagnosis not present

## 2023-12-05 DIAGNOSIS — I130 Hypertensive heart and chronic kidney disease with heart failure and stage 1 through stage 4 chronic kidney disease, or unspecified chronic kidney disease: Secondary | ICD-10-CM | POA: Diagnosis not present

## 2023-12-05 DIAGNOSIS — C9310 Chronic myelomonocytic leukemia not having achieved remission: Secondary | ICD-10-CM | POA: Diagnosis not present

## 2023-12-05 DIAGNOSIS — D630 Anemia in neoplastic disease: Secondary | ICD-10-CM | POA: Diagnosis not present

## 2023-12-05 DIAGNOSIS — I482 Chronic atrial fibrillation, unspecified: Secondary | ICD-10-CM | POA: Diagnosis not present

## 2023-12-05 DIAGNOSIS — I5022 Chronic systolic (congestive) heart failure: Secondary | ICD-10-CM | POA: Diagnosis not present

## 2023-12-07 DIAGNOSIS — C9310 Chronic myelomonocytic leukemia not having achieved remission: Secondary | ICD-10-CM | POA: Diagnosis not present

## 2023-12-07 DIAGNOSIS — I482 Chronic atrial fibrillation, unspecified: Secondary | ICD-10-CM | POA: Diagnosis not present

## 2023-12-07 DIAGNOSIS — N1831 Chronic kidney disease, stage 3a: Secondary | ICD-10-CM | POA: Diagnosis not present

## 2023-12-07 DIAGNOSIS — I130 Hypertensive heart and chronic kidney disease with heart failure and stage 1 through stage 4 chronic kidney disease, or unspecified chronic kidney disease: Secondary | ICD-10-CM | POA: Diagnosis not present

## 2023-12-07 DIAGNOSIS — D630 Anemia in neoplastic disease: Secondary | ICD-10-CM | POA: Diagnosis not present

## 2023-12-07 DIAGNOSIS — I5022 Chronic systolic (congestive) heart failure: Secondary | ICD-10-CM | POA: Diagnosis not present

## 2023-12-10 DIAGNOSIS — M818 Other osteoporosis without current pathological fracture: Secondary | ICD-10-CM | POA: Diagnosis not present

## 2023-12-11 DIAGNOSIS — C9310 Chronic myelomonocytic leukemia not having achieved remission: Secondary | ICD-10-CM | POA: Diagnosis not present

## 2023-12-11 DIAGNOSIS — D630 Anemia in neoplastic disease: Secondary | ICD-10-CM | POA: Diagnosis not present

## 2023-12-11 DIAGNOSIS — N1831 Chronic kidney disease, stage 3a: Secondary | ICD-10-CM | POA: Diagnosis not present

## 2023-12-11 DIAGNOSIS — I482 Chronic atrial fibrillation, unspecified: Secondary | ICD-10-CM | POA: Diagnosis not present

## 2023-12-11 DIAGNOSIS — I5022 Chronic systolic (congestive) heart failure: Secondary | ICD-10-CM | POA: Diagnosis not present

## 2023-12-11 DIAGNOSIS — I130 Hypertensive heart and chronic kidney disease with heart failure and stage 1 through stage 4 chronic kidney disease, or unspecified chronic kidney disease: Secondary | ICD-10-CM | POA: Diagnosis not present

## 2023-12-13 DIAGNOSIS — D630 Anemia in neoplastic disease: Secondary | ICD-10-CM | POA: Diagnosis not present

## 2023-12-13 DIAGNOSIS — N1831 Chronic kidney disease, stage 3a: Secondary | ICD-10-CM | POA: Diagnosis not present

## 2023-12-13 DIAGNOSIS — I482 Chronic atrial fibrillation, unspecified: Secondary | ICD-10-CM | POA: Diagnosis not present

## 2023-12-13 DIAGNOSIS — C9310 Chronic myelomonocytic leukemia not having achieved remission: Secondary | ICD-10-CM | POA: Diagnosis not present

## 2023-12-13 DIAGNOSIS — I130 Hypertensive heart and chronic kidney disease with heart failure and stage 1 through stage 4 chronic kidney disease, or unspecified chronic kidney disease: Secondary | ICD-10-CM | POA: Diagnosis not present

## 2023-12-13 DIAGNOSIS — I5022 Chronic systolic (congestive) heart failure: Secondary | ICD-10-CM | POA: Diagnosis not present

## 2023-12-15 DIAGNOSIS — E440 Moderate protein-calorie malnutrition: Secondary | ICD-10-CM | POA: Diagnosis not present

## 2023-12-15 DIAGNOSIS — I482 Chronic atrial fibrillation, unspecified: Secondary | ICD-10-CM | POA: Diagnosis not present

## 2023-12-15 DIAGNOSIS — I130 Hypertensive heart and chronic kidney disease with heart failure and stage 1 through stage 4 chronic kidney disease, or unspecified chronic kidney disease: Secondary | ICD-10-CM | POA: Diagnosis not present

## 2023-12-15 DIAGNOSIS — D509 Iron deficiency anemia, unspecified: Secondary | ICD-10-CM | POA: Diagnosis not present

## 2023-12-15 DIAGNOSIS — D630 Anemia in neoplastic disease: Secondary | ICD-10-CM | POA: Diagnosis not present

## 2023-12-15 DIAGNOSIS — C9310 Chronic myelomonocytic leukemia not having achieved remission: Secondary | ICD-10-CM | POA: Diagnosis not present

## 2023-12-15 DIAGNOSIS — I5022 Chronic systolic (congestive) heart failure: Secondary | ICD-10-CM | POA: Diagnosis not present

## 2023-12-15 DIAGNOSIS — M069 Rheumatoid arthritis, unspecified: Secondary | ICD-10-CM | POA: Diagnosis not present

## 2023-12-15 DIAGNOSIS — Z7901 Long term (current) use of anticoagulants: Secondary | ICD-10-CM | POA: Diagnosis not present

## 2023-12-15 DIAGNOSIS — Z87891 Personal history of nicotine dependence: Secondary | ICD-10-CM | POA: Diagnosis not present

## 2023-12-15 DIAGNOSIS — N1831 Chronic kidney disease, stage 3a: Secondary | ICD-10-CM | POA: Diagnosis not present

## 2023-12-15 DIAGNOSIS — E871 Hypo-osmolality and hyponatremia: Secondary | ICD-10-CM | POA: Diagnosis not present

## 2023-12-15 DIAGNOSIS — Z7952 Long term (current) use of systemic steroids: Secondary | ICD-10-CM | POA: Diagnosis not present

## 2023-12-15 DIAGNOSIS — I700 Atherosclerosis of aorta: Secondary | ICD-10-CM | POA: Diagnosis not present

## 2023-12-15 DIAGNOSIS — J449 Chronic obstructive pulmonary disease, unspecified: Secondary | ICD-10-CM | POA: Diagnosis not present

## 2023-12-17 DIAGNOSIS — C9310 Chronic myelomonocytic leukemia not having achieved remission: Secondary | ICD-10-CM | POA: Diagnosis not present

## 2023-12-17 DIAGNOSIS — I482 Chronic atrial fibrillation, unspecified: Secondary | ICD-10-CM | POA: Diagnosis not present

## 2023-12-17 DIAGNOSIS — D630 Anemia in neoplastic disease: Secondary | ICD-10-CM | POA: Diagnosis not present

## 2023-12-17 DIAGNOSIS — N1831 Chronic kidney disease, stage 3a: Secondary | ICD-10-CM | POA: Diagnosis not present

## 2023-12-17 DIAGNOSIS — I130 Hypertensive heart and chronic kidney disease with heart failure and stage 1 through stage 4 chronic kidney disease, or unspecified chronic kidney disease: Secondary | ICD-10-CM | POA: Diagnosis not present

## 2023-12-17 DIAGNOSIS — I5022 Chronic systolic (congestive) heart failure: Secondary | ICD-10-CM | POA: Diagnosis not present

## 2023-12-21 ENCOUNTER — Inpatient Hospital Stay (HOSPITAL_BASED_OUTPATIENT_CLINIC_OR_DEPARTMENT_OTHER): Payer: Medicare Other | Admitting: Nurse Practitioner

## 2023-12-21 ENCOUNTER — Inpatient Hospital Stay: Payer: Medicare Other

## 2023-12-21 VITALS — BP 121/53 | HR 71 | Temp 98.1°F | Resp 15 | Wt 99.8 lb

## 2023-12-21 DIAGNOSIS — C9310 Chronic myelomonocytic leukemia not having achieved remission: Secondary | ICD-10-CM

## 2023-12-21 DIAGNOSIS — Z95828 Presence of other vascular implants and grafts: Secondary | ICD-10-CM

## 2023-12-21 DIAGNOSIS — Z5111 Encounter for antineoplastic chemotherapy: Secondary | ICD-10-CM | POA: Diagnosis not present

## 2023-12-21 DIAGNOSIS — Z79899 Other long term (current) drug therapy: Secondary | ICD-10-CM | POA: Diagnosis not present

## 2023-12-21 LAB — CBC WITH DIFFERENTIAL (CANCER CENTER ONLY)
Abs Immature Granulocytes: 0.19 10*3/uL — ABNORMAL HIGH (ref 0.00–0.07)
Basophils Absolute: 0.1 10*3/uL (ref 0.0–0.1)
Basophils Relative: 1 %
Eosinophils Absolute: 0 10*3/uL (ref 0.0–0.5)
Eosinophils Relative: 0 %
HCT: 33.9 % — ABNORMAL LOW (ref 36.0–46.0)
Hemoglobin: 11 g/dL — ABNORMAL LOW (ref 12.0–15.0)
Immature Granulocytes: 1 %
Lymphocytes Relative: 12 %
Lymphs Abs: 1.6 10*3/uL (ref 0.7–4.0)
MCH: 33.8 pg (ref 26.0–34.0)
MCHC: 32.4 g/dL (ref 30.0–36.0)
MCV: 104.3 fL — ABNORMAL HIGH (ref 80.0–100.0)
Monocytes Absolute: 2.3 10*3/uL — ABNORMAL HIGH (ref 0.1–1.0)
Monocytes Relative: 17 %
Neutro Abs: 9.3 10*3/uL — ABNORMAL HIGH (ref 1.7–7.7)
Neutrophils Relative %: 69 %
Platelet Count: 146 10*3/uL — ABNORMAL LOW (ref 150–400)
RBC: 3.25 MIL/uL — ABNORMAL LOW (ref 3.87–5.11)
RDW: 16.6 % — ABNORMAL HIGH (ref 11.5–15.5)
WBC Count: 13.5 10*3/uL — ABNORMAL HIGH (ref 4.0–10.5)
nRBC: 0 % (ref 0.0–0.2)

## 2023-12-21 LAB — CMP (CANCER CENTER ONLY)
ALT: 14 U/L (ref 0–44)
AST: 21 U/L (ref 15–41)
Albumin: 4.1 g/dL (ref 3.5–5.0)
Alkaline Phosphatase: 81 U/L (ref 38–126)
Anion gap: 9 (ref 5–15)
BUN: 23 mg/dL (ref 8–23)
CO2: 25 mmol/L (ref 22–32)
Calcium: 8.8 mg/dL — ABNORMAL LOW (ref 8.9–10.3)
Chloride: 101 mmol/L (ref 98–111)
Creatinine: 0.96 mg/dL (ref 0.44–1.00)
GFR, Estimated: >60 mL/min (ref >60)
Glucose, Bld: 110 mg/dL — ABNORMAL HIGH (ref 70–99)
Potassium: 4.8 mmol/L (ref 3.5–5.1)
Sodium: 135 mmol/L (ref 135–145)
Total Bilirubin: 0.8 mg/dL (ref <1.2)
Total Protein: 6.8 g/dL (ref 6.5–8.1)

## 2023-12-21 MED ORDER — HEPARIN SOD (PORK) LOCK FLUSH 100 UNIT/ML IV SOLN
500.0000 [IU] | Freq: Once | INTRAVENOUS | Status: AC
  Administered 2023-12-21: 500 [IU]

## 2023-12-21 MED ORDER — MIRTAZAPINE 7.5 MG PO TABS: 7.5000 mg | ORAL_TABLET | Freq: Every day | ORAL | 1 refills | Status: DC

## 2023-12-21 MED ORDER — SODIUM CHLORIDE 0.9% FLUSH
10.0000 mL | Freq: Once | INTRAVENOUS | Status: AC
  Administered 2023-12-21: 10 mL

## 2023-12-21 NOTE — Progress Notes (Unsigned)
Patient Care Team: [name], MD as PCP - General (Internal Medicine) [name], Deloris Ping, MD as PCP - Cardiology (Cardiology) Zenovia Jordan, MD as Consulting Physician (Rheumatology) Malachy Mood, MD as Consulting Physician (Hematology) Charna Elizabeth, MD as Consulting Physician (Gastroenterology) Josephine Igo, DO as Consulting Physician (Pulmonary Disease) Pickenpack-Cousar, Arty Baumgartner, NP as Nurse Practitioner [facility] and Palliative Medicine)  Clinic Day:  12/21/2023  Referring physician: [name], MD  ASSESSMENT & PLAN:   Assessment & Plan: CMML (chronic myelomonocytic leukemia) (HCC) diagnosed in 10/2021 ---She developed worsening back pain, pelvic MRI 07/04/21 showed a mass in the presacral space. 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 Her BM biopsy was reviewed at [facility] and was felt to be CMML-1 with 5% blasts.  -She began azacitadine on 02/06/22. she has had multiple hospital admissions and her performance status has been very low.  Changed her chemo to every 6 weeks in 08/2022 due to her fatigue. -she is tolerating every 6 weeks much better -she saw Dr. Sharyne Richters in Feb 2024, who recommends to continue current therapy  -Due to worsening low back pain, she completed palliative radiation on 07/02/2023 -due to worsening low back pain, she was hospitalized on November 12, 2023.  Currently follow-up with palliative care for pain control.    The patient understands the plans discussed today and is in agreement with them.  She knows to contact our office if she develops concerns prior to her next appointment.  I provided *** minutes of face-to-face time during this encounter and > 50% was spent counseling as documented under my assessment and plan.    Carlean Jews, NP  [facility] - A DEPT OF [facility] [address] [place] [address] Dept: [phone] Dept Fax: [phone]   No orders of the defined types were placed in this encounter.     CHIEF COMPLAINT:  CC: Chronic myelomonocytic leukemia not having achieved remission  Current Treatment: Azacitidine days 1 through 5 every 6 weeks.  INTERVAL HISTORY:  [name] is here today for repeat clinical assessment.  He was last seen by Dr. Mosetta Putt on 11/26/2023.  She also saw Lowella Bandy, NP of palliative care to help with pain management.  She denies fevers or chills. She denies pain. Her appetite is good. Her weight {Weight change:10426}.  I have reviewed the past medical history, past surgical history, social history and family history with the patient and they are unchanged from previous note.  ALLERGIES:  is allergic to digoxin and related, gluten meal, penicillins, fexofenadine, alendronate, and hydroxychloroquine.  MEDICATIONS:  Current Outpatient Medications  Medication Sig Dispense Refill   acetaminophen (TYLENOL) 500 MG tablet Take 2 tablets (1,000 mg total) by mouth 3 (three) times daily. 30 tablet 0   apixaban (ELIQUIS) 5 MG TABS tablet Take 1 tablet (5 mg total) by mouth 2 (two) times daily. 60 tablet 11   denosumab (PROLIA) 60 MG/ML SOSY injection Inject 60 mg into the skin every 6 (six) months.     diltiazem (CARDIZEM CD) 360 MG 24 hr capsule Take 1 capsule (360 mg total) by mouth daily. 90 capsule 3   gabapentin (NEURONTIN) 300 MG capsule Take 300 mg by mouth in the morning.     Metoprolol Tartrate 75 MG TABS Take 2 tablets (150 mg total) by mouth  2 (two) times daily. 360 tablet 2   predniSONE (DELTASONE) 5 MG tablet Take 5 mg by mouth daily with breakfast.     Tiotropium Bromide-Olodaterol (STIOLTO RESPIMAT) 2.5-2.5 MCG/ACT AERS Inhale 2 puffs into the lungs daily. 4 g 11   furosemide (LASIX) 20 MG tablet Take 1 tablet (20 mg total) by mouth  daily. 30 tablet 11   HYDROmorphone (DILAUDID) 2 MG tablet Take 1-1.5 tablets (2-3 mg total) by mouth every 3 (three) hours as needed for severe pain (pain score 7-10) or moderate pain (pain score 4-6). (Patient not taking: Reported on 12/21/2023) 60 tablet 0   naproxen (NAPROSYN) 375 MG tablet Take 1 tablet (375 mg total) by mouth 2 (two) times daily as needed. (Patient not taking: Reported on 12/21/2023) 40 tablet 0   No current facility-administered medications for this visit.   Facility-Administered Medications Ordered in Other Visits  Medication Dose Route Frequency Provider Last Rate Last Admin   acetaminophen (TYLENOL) 325 MG tablet            diphenhydrAMINE (BENADRYL) 25 mg capsule             HISTORY OF PRESENT ILLNESS:   Oncology History Overview Note   Cancer Staging  CMML (chronic myelomonocytic leukemia) (HCC) Staging form: Chronic Myeloid Leukemia, AJCC 8th Edition - Clinical stage from 01/12/2022: Bone marrow blast count (%): 12, Additional clonal changes: Unknown - Signed by Malachy Mood, MD on 01/12/2022 Stage prefix: Initial diagnosis     CMML (chronic myelomonocytic leukemia) (HCC)  11/09/2021 Initial Biopsy   DIAGNOSIS:   -  Monoclonal B-cell population with co-expression of CD5 comprises 17%  of all lymphocytes  -  See comment   COMMENT:  In addition to the clonal B-cell population, there is a myeloblast  population (CD34, CD38, HLA-DR, CD117, CD123 and CD33) that comprises 2% of the total cellular events.  Please see concurrent tissue biopsy (below) for additional work-up and final diagnosis.    FINAL MICROSCOPIC DIAGNOSIS:   A. SOFT TISSUE MASS, PRE SACRAL, NEEDLE CORE BIOPSY:  -  Chronic lymphocytic leukemia/small lymphocytic lymphoma  -  Extra medullary hematopoiesis  -  See comment   COMMENT:  The biopsy consists of multiple soft tissue cores with lymphoid nodules and a dense hematopoietic infiltrate consistent with extra medullary hematopoiesis.  MPO  and E-cadherin highlight myeloid and erythroid precursors respectively.  CD34 highlights increased vasculature and is also positive within the cytoplasm of megakaryocytes.  A few small, immature mononuclear cells appear to be positive for CD34 and CD117. TdT shows rare, scattered positive cells.  CD20 highlights aggregates of B cells which are admixed with CD3 positive T cells.  T cells are an admixture of CD4 and CD8.  The B cells are also positive for CD5, CD23 and Bcl-2.  The B cells do not show significant staining for CD10, BCL6 or cyclin D1.  CD138 highlights scattered plasma cells which are polytypic by kappa and lambda in situ hybridization.  Flow cytometry performed on the sample (see WL [id number]) identified a kappa restricted CD5 positive B-cell population comprising 70% of lymphocytes.  In addition, a small myeloblast population comprised 2% of the total cellular events.   Overall, the findings are consistent with soft tissue involvement by  chronic lymphocytic leukemia/small lymphocytic lymphoma and extra medullary hematopoiesis. In reviewing the patient's CBC data (macrocytic anemia and thrombocytopenia), I would recommend a bone marrow biopsy to assess for marrow involvement by CLL/SLL.    12/23/2021 Imaging   EXAM:  CT CHEST, ABDOMEN, AND PELVIS WITH CONTRAST  IMPRESSION: 1. Slight interval enlargement of a presacral soft tissue mass measuring 7.2 x 4.7 cm, previously 6.9 x 4.1 cm on prior MR of the pelvis dated 07/04/2021. By report, this represents a biopsy proven lymphoma. 2. Pleural nodule of the dependent right lower lobe overlying the posterior right tenth rib and pleural or paraspinous soft tissue mass overlying the right aspect of the T10 vertebral body, very slightly enlarged compared to prior examination of the chest dated 06/25/2020, consistent with additional sites of lymphomatous involvement given very indolent growth. These could be better assessed for metabolic  activity by FDG PET/CT if desired. 3. There is mild, bibasilar predominant pulmonary fibrosis in a pattern featuring irregular peripheral interstitial opacity, septal thickening, but without clear evidence of subpleural bronchiolectasis or honeycombing, with a somewhat asymmetric distribution most conspicuously involving the right lower lobe and lingula. These findings are significantly worsened when compared to prior examination dated 06/25/2020, particularly in the right lower lobe. Given interval change, this may reflect sequelae of interval infection or aspiration, however appearance is generally suspicious for fibrotic interstitial lung disease, and if characterized by ATS pulmonary fibrosis is in an "indeterminate for UIP" pattern, differential considerations including both UIP and NSIP. 4. Emphysema.   Aortic Atherosclerosis (ICD10-I70.0) and Emphysema (ICD10-J43.9).   01/05/2022 Pathology Results   DIAGNOSIS:   BONE MARROW, ASPIRATE, CLOT, CORE:  -Hypercellular bone marrow for age with features of  myelodysplastic/myeloproliferative neoplasm  -Minor abnormal B-cell population  -See comment   PERIPHERAL BLOOD:  -Macrocytic anemia  -Neutrophilic left shift and monocytosis  -Thrombocytopenia   COMMENT:  The bone marrow is hypercellular for age with dyspoietic changes  involving myeloid cell lines associated with monocytosis and increased number of blastic cells (12%) as primarily seen by morphology, many of which display monocytic features.  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  In this background, there are several predominantly small lymphoid aggregates mostly composed of small lymphoid cells.  By flow cytometry, a minor abnormal B-cell population expressing CD5 is seen and representing 2% of all cells.   This correlate with previously known B-cell lymphoproliferative process.  Correlation with cytogenetic and FISH studies is strongly recommended.    DIAGNOSIS:   -Increased number of monocytic cells present (25%)  -Minor abnormal B-cell population identified.  -See comment   COMMENT:  Flow cytometric analysis shows increased number of monocytic cells representing 25% of all cells but without aberrant phenotype or CD34 expression.  A significant CD34-positive blastic population is not identified.  The lymphoid population shows a minor B-cell population representing 2% of all cells and expressing B-cell antigens including CD20 associated with CD5, CD200 and possibly dim kappa expression.  The latter findings are abnormal and correlate with previously known B-cell lymphoproliferative process.  No significant T-cell phenotypic abnormalities identified.    01/12/2022 Initial Diagnosis   CMML (chronic myelomonocytic leukemia) (HCC)   01/12/2022 Cancer Staging   Staging form: Chronic Myeloid Leukemia, AJCC 8th Edition - Clinical stage from 01/12/2022: Bone marrow blast count (%): 12, Additional clonal changes: Unknown - Signed by Malachy Mood, MD on 01/12/2022 Stage prefix: Initial diagnosis   02/06/2022 - 08/11/2022 Chemotherapy   Patient is on Treatment Plan : MYELODYSPLASIA  Azacitidine IV D1-7 q28d     02/06/2022 -  Chemotherapy   Patient is on Treatment Plan : MYELODYSPLASIA  Azacitidine IV D1-7 q28d  06/03/2023 Imaging   MR lumbar spine with and without contrast IMPRESSION: 1. Long-standing presacral mass, at least doubled in size since 2016, now at least 8 cm in length. There is internal fat components and differential considerations include myelolipoma, liposarcoma, and teratoma. 2. 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 3. L2-3 left paracentral extrusion compressing the left L3 nerve root. 4. Right foraminal impingement at L3-4 to L4-5, greatest at  L4-5. Right subarticular recess narrowing at L3-4 and L4-5.   07/09/2023 Imaging   CT abdomen and pelvis with contrast  IMPRESSION: No acute findings in the abdomen or pelvis. Specifically, no findings to explain the patient's history of pain. 2. Interval progression of presacral and right thoracic paraspinal soft tissue lesions compatible with mild progression of disease since 06/19/2022. Small posterior right thoracic chest wall lesion also mildly progressive. 3. Mild circumferential wall thickening distal esophagus. Esophagitis would be a consideration. 4.  Aortic Atherosclerosis (ICD10-I70.0).       REVIEW OF SYSTEMS:   Constitutional: Denies fevers, chills or abnormal weight loss Eyes: Denies blurriness of vision Ears, nose, mouth, throat, and face: Denies mucositis or sore throat Respiratory: Denies cough, dyspnea or wheezes Cardiovascular: Denies palpitation, chest discomfort or lower extremity swelling Gastrointestinal:  Denies nausea, heartburn or change in bowel habits Skin: Denies abnormal skin rashes Lymphatics: Denies new lymphadenopathy or easy bruising Neurological:Denies numbness, tingling or new weaknesses Behavioral/Psych: Mood is stable, no new changes  All other systems were reviewed with the patient and are negative.   VITALS:  Blood pressure (!) 121/53, pulse 71, temperature 98.1 F (36.7 C), temperature source Temporal, resp. rate 15, weight 99 lb 12.8 oz (45.3 kg), SpO2 99%.  Wt Readings from Last 3 Encounters:  12/21/23 99 lb 12.8 oz (45.3 kg)  11/27/23 103 lb 12 oz (47.1 kg)  11/26/23 104 lb 3.2 oz (47.3 kg)    Body mass index is 17.68 kg/m.  Performance status (ECOG): {CHL ONC [id number]  PHYSICAL EXAM:   GENERAL:alert, no distress and comfortable SKIN: skin color, texture, turgor are normal, no rashes or significant lesions EYES: normal, Conjunctiva are pink and non-injected, sclera clear OROPHARYNX:no exudate, no erythema and lips, buccal  mucosa, and tongue normal  NECK: supple, thyroid normal size, non-tender, without nodularity LYMPH:  no palpable lymphadenopathy in the cervical, axillary or inguinal LUNGS: clear to auscultation and percussion with normal breathing effort HEART: regular rate & rhythm and no murmurs and no lower extremity edema ABDOMEN:abdomen soft, non-tender and normal bowel sounds Musculoskeletal:no cyanosis of digits and no clubbing  NEURO: alert & oriented x 3 with fluent speech, no focal motor/sensory deficits  LABORATORY DATA:  I have reviewed the data as listed    Component Value Date/Time   NA 135 12/21/2023 1359   NA 141 01/04/2016 1001   K 4.8 12/21/2023 1359   K 4.5 01/04/2016 1001   CL 101 12/21/2023 1359   CO2 25 12/21/2023 1359   CO2 24 01/04/2016 1001   GLUCOSE 110 (H) 12/21/2023 1359   GLUCOSE 105 01/04/2016 1001   BUN 23 12/21/2023 1359   BUN 21.7 01/04/2016 1001   CREATININE 0.96 12/21/2023 1359   CREATININE 1.2 (H) 01/04/2016 1001   CALCIUM 8.8 (L) 12/21/2023 1359   CALCIUM 9.7 01/04/2016 1001   PROT 6.8 12/21/2023 1359   PROT 7.0 01/04/2016 1001   ALBUMIN 4.1 12/21/2023 1359   ALBUMIN 4.0 01/04/2016 1001   AST 21 12/21/2023 1359   AST 26 01/04/2016 1001   ALT  14 12/21/2023 1359   ALT 24 01/04/2016 1001   ALKPHOS 81 12/21/2023 1359   ALKPHOS 61 01/04/2016 1001   BILITOT 0.8 12/21/2023 1359   BILITOT 0.41 01/04/2016 1001   GFRNONAA >60 12/21/2023 1359   GFRAA 56 (L) 01/09/2020 1339    No results found for: "SPEP", "UPEP"  Lab Results  Component Value Date   WBC 13.5 (H) 12/21/2023   NEUTROABS 9.3 (H) 12/21/2023   HGB 11.0 (L) 12/21/2023   HCT 33.9 (L) 12/21/2023   MCV 104.3 (H) 12/21/2023   PLT 146 (L) 12/21/2023      Chemistry      Component Value Date/Time   NA 135 12/21/2023 1359   NA 141 01/04/2016 1001   K 4.8 12/21/2023 1359   K 4.5 01/04/2016 1001   CL 101 12/21/2023 1359   CO2 25 12/21/2023 1359   CO2 24 01/04/2016 1001   BUN 23 12/21/2023  1359   BUN 21.7 01/04/2016 1001   CREATININE 0.96 12/21/2023 1359   CREATININE 1.2 (H) 01/04/2016 1001      Component Value Date/Time   CALCIUM 8.8 (L) 12/21/2023 1359   CALCIUM 9.7 01/04/2016 1001   ALKPHOS 81 12/21/2023 1359   ALKPHOS 61 01/04/2016 1001   AST 21 12/21/2023 1359   AST 26 01/04/2016 1001   ALT 14 12/21/2023 1359   ALT 24 01/04/2016 1001   BILITOT 0.8 12/21/2023 1359   BILITOT 0.41 01/04/2016 1001       RADIOGRAPHIC STUDIES: I have personally reviewed the radiological images as listed and agreed with the findings in the report. No results found.

## 2023-12-21 NOTE — Assessment & Plan Note (Signed)
 diagnosed in 10/2021 ---She developed worsening back pain, pelvic MRI 07/04/21 showed a mass in the presacral space. 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  -She began azacitadine on 02/06/22. she has had multiple hospital admissions and her performance status has been very low.  Changed her chemo to every 6 weeks in 08/2022 due to her fatigue. -she is tolerating every 6 weeks much better -she saw Dr. Sharyne Richters in Feb 2024, who recommends to continue current therapy  -Due to worsening low back pain, she completed palliative radiation on 07/02/2023 -due to worsening low back pain, she was hospitalized on November 12, 2023.  Currently follow-up with palliative care for pain control.

## 2023-12-25 DIAGNOSIS — N1831 Chronic kidney disease, stage 3a: Secondary | ICD-10-CM | POA: Diagnosis not present

## 2023-12-25 DIAGNOSIS — I130 Hypertensive heart and chronic kidney disease with heart failure and stage 1 through stage 4 chronic kidney disease, or unspecified chronic kidney disease: Secondary | ICD-10-CM | POA: Diagnosis not present

## 2023-12-25 DIAGNOSIS — C9310 Chronic myelomonocytic leukemia not having achieved remission: Secondary | ICD-10-CM | POA: Diagnosis not present

## 2023-12-25 DIAGNOSIS — D630 Anemia in neoplastic disease: Secondary | ICD-10-CM | POA: Diagnosis not present

## 2023-12-25 DIAGNOSIS — I482 Chronic atrial fibrillation, unspecified: Secondary | ICD-10-CM | POA: Diagnosis not present

## 2023-12-25 DIAGNOSIS — I5022 Chronic systolic (congestive) heart failure: Secondary | ICD-10-CM | POA: Diagnosis not present

## 2023-12-28 ENCOUNTER — Encounter: Payer: Self-pay | Admitting: Hematology

## 2023-12-28 ENCOUNTER — Encounter: Payer: Self-pay | Admitting: Nurse Practitioner

## 2024-01-01 DIAGNOSIS — M25812 Other specified joint disorders, left shoulder: Secondary | ICD-10-CM | POA: Diagnosis not present

## 2024-01-02 DIAGNOSIS — C9310 Chronic myelomonocytic leukemia not having achieved remission: Secondary | ICD-10-CM | POA: Diagnosis not present

## 2024-01-02 DIAGNOSIS — I130 Hypertensive heart and chronic kidney disease with heart failure and stage 1 through stage 4 chronic kidney disease, or unspecified chronic kidney disease: Secondary | ICD-10-CM | POA: Diagnosis not present

## 2024-01-02 DIAGNOSIS — I482 Chronic atrial fibrillation, unspecified: Secondary | ICD-10-CM | POA: Diagnosis not present

## 2024-01-02 DIAGNOSIS — D630 Anemia in neoplastic disease: Secondary | ICD-10-CM | POA: Diagnosis not present

## 2024-01-02 DIAGNOSIS — I5022 Chronic systolic (congestive) heart failure: Secondary | ICD-10-CM | POA: Diagnosis not present

## 2024-01-02 DIAGNOSIS — N1831 Chronic kidney disease, stage 3a: Secondary | ICD-10-CM | POA: Diagnosis not present

## 2024-01-03 DIAGNOSIS — M0579 Rheumatoid arthritis with rheumatoid factor of multiple sites without organ or systems involvement: Secondary | ICD-10-CM | POA: Diagnosis not present

## 2024-01-04 ENCOUNTER — Telehealth: Payer: Self-pay | Admitting: Hematology

## 2024-01-06 NOTE — Assessment & Plan Note (Signed)
 diagnosed in 10/2021 ---She developed worsening back pain, pelvic MRI 07/04/21 showed a mass in the presacral space. 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  -She began azacitadine on 02/06/22. she has had multiple hospital admissions and her performance status has been very low.  Changed her chemo to every 6 weeks in 08/2022 due to her fatigue. -she is tolerating every 6 weeks much better -she saw Dr. Sharyne Richters in Feb 2024, who recommends to continue current therapy  -Due to worsening low back pain, she completed palliative radiation on 07/02/2023 -due to worsening low back pain, she was hospitalized on November 12, 2023.  Currently follow-up with palliative care for pain control.

## 2024-01-07 ENCOUNTER — Inpatient Hospital Stay: Payer: Medicare Other

## 2024-01-07 ENCOUNTER — Encounter: Payer: Self-pay | Admitting: Nurse Practitioner

## 2024-01-07 ENCOUNTER — Inpatient Hospital Stay: Payer: Medicare Other | Attending: Nurse Practitioner

## 2024-01-07 ENCOUNTER — Inpatient Hospital Stay (HOSPITAL_BASED_OUTPATIENT_CLINIC_OR_DEPARTMENT_OTHER): Payer: Medicare Other | Admitting: Hematology

## 2024-01-07 ENCOUNTER — Inpatient Hospital Stay (HOSPITAL_BASED_OUTPATIENT_CLINIC_OR_DEPARTMENT_OTHER): Payer: Medicare Other | Admitting: Nurse Practitioner

## 2024-01-07 ENCOUNTER — Encounter: Payer: Self-pay | Admitting: Hematology

## 2024-01-07 VITALS — BP 136/67 | HR 69 | Temp 98.0°F | Resp 16 | Wt 104.3 lb

## 2024-01-07 DIAGNOSIS — I482 Chronic atrial fibrillation, unspecified: Secondary | ICD-10-CM | POA: Diagnosis not present

## 2024-01-07 DIAGNOSIS — C9310 Chronic myelomonocytic leukemia not having achieved remission: Secondary | ICD-10-CM

## 2024-01-07 DIAGNOSIS — I130 Hypertensive heart and chronic kidney disease with heart failure and stage 1 through stage 4 chronic kidney disease, or unspecified chronic kidney disease: Secondary | ICD-10-CM | POA: Diagnosis not present

## 2024-01-07 DIAGNOSIS — Z5111 Encounter for antineoplastic chemotherapy: Secondary | ICD-10-CM | POA: Diagnosis not present

## 2024-01-07 DIAGNOSIS — I5022 Chronic systolic (congestive) heart failure: Secondary | ICD-10-CM | POA: Diagnosis not present

## 2024-01-07 DIAGNOSIS — Z79899 Other long term (current) drug therapy: Secondary | ICD-10-CM | POA: Diagnosis not present

## 2024-01-07 DIAGNOSIS — Z95828 Presence of other vascular implants and grafts: Secondary | ICD-10-CM

## 2024-01-07 DIAGNOSIS — R530 Neoplastic (malignant) related fatigue: Secondary | ICD-10-CM

## 2024-01-07 DIAGNOSIS — D630 Anemia in neoplastic disease: Secondary | ICD-10-CM | POA: Diagnosis not present

## 2024-01-07 DIAGNOSIS — Z515 Encounter for palliative care: Secondary | ICD-10-CM

## 2024-01-07 DIAGNOSIS — N1831 Chronic kidney disease, stage 3a: Secondary | ICD-10-CM | POA: Diagnosis not present

## 2024-01-07 LAB — CBC WITH DIFFERENTIAL (CANCER CENTER ONLY)
Abs Immature Granulocytes: 0.74 10*3/uL — ABNORMAL HIGH (ref 0.00–0.07)
Basophils Absolute: 0.2 10*3/uL — ABNORMAL HIGH (ref 0.0–0.1)
Basophils Relative: 1 %
Eosinophils Absolute: 0.2 10*3/uL (ref 0.0–0.5)
Eosinophils Relative: 1 %
HCT: 34.1 % — ABNORMAL LOW (ref 36.0–46.0)
Hemoglobin: 10.8 g/dL — ABNORMAL LOW (ref 12.0–15.0)
Immature Granulocytes: 5 %
Lymphocytes Relative: 11 %
Lymphs Abs: 1.6 10*3/uL (ref 0.7–4.0)
MCH: 32.8 pg (ref 26.0–34.0)
MCHC: 31.7 g/dL (ref 30.0–36.0)
MCV: 103.6 fL — ABNORMAL HIGH (ref 80.0–100.0)
Monocytes Absolute: 2.2 10*3/uL — ABNORMAL HIGH (ref 0.1–1.0)
Monocytes Relative: 15 %
Neutro Abs: 9.6 10*3/uL — ABNORMAL HIGH (ref 1.7–7.7)
Neutrophils Relative %: 67 %
Platelet Count: 235 10*3/uL (ref 150–400)
RBC: 3.29 MIL/uL — ABNORMAL LOW (ref 3.87–5.11)
RDW: 14.9 % (ref 11.5–15.5)
WBC Count: 14.5 10*3/uL — ABNORMAL HIGH (ref 4.0–10.5)
nRBC: 0 % (ref 0.0–0.2)

## 2024-01-07 LAB — BASIC METABOLIC PANEL - CANCER CENTER ONLY
Anion gap: 7 (ref 5–15)
BUN: 29 mg/dL — ABNORMAL HIGH (ref 8–23)
CO2: 28 mmol/L (ref 22–32)
Calcium: 9 mg/dL (ref 8.9–10.3)
Chloride: 101 mmol/L (ref 98–111)
Creatinine: 0.94 mg/dL (ref 0.44–1.00)
GFR, Estimated: >60 mL/min (ref >60)
Glucose, Bld: 199 mg/dL — ABNORMAL HIGH (ref 70–99)
Potassium: 4.3 mmol/L (ref 3.5–5.1)
Sodium: 136 mmol/L (ref 135–145)

## 2024-01-07 MED ORDER — HEPARIN SOD (PORK) LOCK FLUSH 100 UNIT/ML IV SOLN
500.0000 [IU] | Freq: Once | INTRAVENOUS | Status: AC | PRN
  Administered 2024-01-07: 500 [IU]

## 2024-01-07 MED ORDER — SODIUM CHLORIDE 0.9% FLUSH
10.0000 mL | INTRAVENOUS | Status: DC | PRN
  Administered 2024-01-07: 10 mL

## 2024-01-07 MED ORDER — SODIUM CHLORIDE 0.9% FLUSH
10.0000 mL | Freq: Once | INTRAVENOUS | Status: AC
  Administered 2024-01-07: 10 mL

## 2024-01-07 MED ORDER — SODIUM CHLORIDE 0.9 % IV SOLN: Freq: Once | INTRAVENOUS | Status: AC

## 2024-01-07 MED ORDER — ONDANSETRON HCL 4 MG/2ML IJ SOLN
8.0000 mg | Freq: Once | INTRAMUSCULAR | Status: AC
  Administered 2024-01-07: 8 mg via INTRAVENOUS
  Filled 2024-01-07: qty 4

## 2024-01-07 MED ORDER — SODIUM CHLORIDE 0.9 % IV SOLN
75.0000 mg/m2 | Freq: Once | INTRAVENOUS | Status: AC
  Administered 2024-01-07: 114 mg via INTRAVENOUS
  Filled 2024-01-07: qty 11.4

## 2024-01-07 NOTE — Progress Notes (Signed)
 [facility]   Telephone:([phone] Fax:([phone]   Clinic Follow up Note   Patient Care Team: [name], MD as PCP - General (Internal Medicine) [name], [name], MD as PCP - Cardiology (Cardiology) [name], MD as Consulting Physician (Rheumatology) [name], MD as Consulting Physician (Hematology) [name], MD as Consulting Physician (Gastroenterology) [name], DO as Consulting Physician (Pulmonary Disease) Pickenpack-Cousar, [name], NP as Nurse Practitioner [facility] and Palliative Medicine)  Date of Service:  01/07/2024  CHIEF COMPLAINT: f/u of CMML  CURRENT THERAPY:  Azacitidine  day 1-5 every 42 days  Oncology History   CMML (chronic myelomonocytic leukemia) (HCC) diagnosed in 10/2021 ---She developed worsening back pain, pelvic MRI 07/04/21 showed a mass in the presacral space. 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 Her BM biopsy was reviewed at [facility] and was felt to be CMML-1 with 5% blasts.  -She began azacitadine on 02/06/22. she has had multiple hospital admissions and her performance status has been very low.  Changed her chemo to every 6 weeks in 08/2022 due to her fatigue. -she is tolerating every 6 weeks much better -she saw Dr. [name] in Feb 2024, who recommends to continue current therapy  -Due to worsening low back pain, she completed palliative radiation on 07/02/2023 -due to worsening low back pain, she was hospitalized on November 12, 2023.  Currently follow-up with palliative care for pain control.    Assessment and Plan    Chronic Myelomonocytic Leukemia (CMML) 78 year old with CMML, undergoing regular infusions. Labs are stable. Pain managed with Tylenol  PRN and gabapentin  daily. Not taking hydroxychloroquine, metyrapone, naproxen , or  mirtazapine . Informed consent for today's infusion obtained; discussed need for antibiotics for invasive dental procedures due to immunocompromised status from chemotherapy. - Proceed with today's infusion at 3:00 PM - Schedule next treatment in six weeks - Advise to call if pain worsens or if experiencing extreme fatigue - Allow routine dental checkups and cleanings; advise antibiotics for invasive dental procedures  Pain Management Overall much better lately. Pain is activity-dependent and managed with Tylenol  PRN and gabapentin  daily. No other pain medications are being taken. - Continue current pain management regimen with Tylenol  PRN and gabapentin  daily - Advise to monitor pain levels and report any significant changes  General Health Maintenance Advised to continue routine dental checkups and cleanings. Antibiotics recommended for invasive dental procedures due to immunocompromised status from chemotherapy. - Allow routine dental checkups and cleanings - Advise antibiotics for invasive dental procedures  Plan -Lab reviewed, adequate for treatment, will proceed azacitidine  today and continue daily for the next 4 days - Schedule next treatment in six weeks - Advise to call if pain worsens or if experiencing extreme fatigue.         SUMMARY OF ONCOLOGIC HISTORY: Oncology History Overview Note   Cancer Staging  CMML (chronic myelomonocytic leukemia) (HCC) Staging form: Chronic Myeloid Leukemia, AJCC 8th Edition - Clinical stage from 01/12/2022: Bone marrow blast count (%): 12, Additional clonal changes: Unknown - Signed by [name], MD on 01/12/2022 Stage prefix: Initial diagnosis     CMML (chronic myelomonocytic leukemia) (HCC)  11/09/2021 Initial Biopsy   DIAGNOSIS:   -  Monoclonal B-cell population with co-expression of CD5 comprises 17%  of all lymphocytes  -  See comment   COMMENT:  In addition to the clonal B-cell population, there is a myeloblast  population  (CD34, CD38, HLA-DR, CD117, CD123 and CD33) that comprises 2% of the total cellular events.  Please see concurrent tissue biopsy (below) for additional work-up and final diagnosis.    FINAL MICROSCOPIC DIAGNOSIS:   A. SOFT TISSUE MASS, PRE SACRAL, NEEDLE CORE BIOPSY:  -  Chronic lymphocytic leukemia/small lymphocytic lymphoma  -  Extra medullary hematopoiesis  -  See comment   COMMENT:  The biopsy consists of multiple soft tissue cores with lymphoid nodules and a dense hematopoietic infiltrate consistent with extra medullary hematopoiesis.  MPO and E-cadherin highlight myeloid and erythroid precursors respectively.  CD34 highlights increased vasculature and is also positive within the cytoplasm of megakaryocytes.  A few small, immature mononuclear cells appear to be positive for CD34 and CD117. TdT shows rare, scattered positive cells.  CD20 highlights aggregates of B cells which are admixed with CD3 positive T cells.  T cells are an admixture of CD4 and CD8.  The B cells are also positive for CD5, CD23 and Bcl-2.  The B cells do not show significant staining for CD10, BCL6 or cyclin D1.  CD138 highlights scattered plasma cells which are polytypic by kappa and lambda in situ hybridization.  Flow cytometry performed on the sample (see WL [id number]) identified a kappa restricted CD5 positive B-cell population comprising 70% of lymphocytes.  In addition, a small myeloblast population comprised 2% of the total cellular events.   Overall, the findings are consistent with soft tissue involvement by  chronic lymphocytic leukemia/small lymphocytic lymphoma and extra medullary hematopoiesis. In reviewing the patient's CBC data (macrocytic anemia and thrombocytopenia), I would recommend a bone marrow biopsy to assess for marrow involvement by CLL/SLL.    12/23/2021 Imaging   EXAM: CT CHEST, ABDOMEN, AND PELVIS WITH CONTRAST  IMPRESSION: 1. Slight interval enlargement of a presacral soft tissue  mass measuring 7.2 x 4.7 cm, previously 6.9 x 4.1 cm on prior MR of the pelvis dated 07/04/2021. By report, this represents a biopsy proven lymphoma. 2. Pleural nodule of the dependent right lower lobe overlying the posterior right tenth rib and pleural or paraspinous soft tissue mass overlying the right aspect of the T10 vertebral body, very slightly enlarged compared to prior examination of the chest dated 06/25/2020, consistent with additional sites of lymphomatous involvement given very indolent growth. These could be better assessed for metabolic activity by FDG PET/CT if desired. 3. There is mild, bibasilar predominant pulmonary fibrosis in a pattern featuring irregular peripheral interstitial opacity, septal thickening, but without clear evidence of subpleural bronchiolectasis or honeycombing, with a somewhat asymmetric distribution most conspicuously involving the right lower lobe and lingula. These findings are significantly worsened when compared to prior examination dated 06/25/2020, particularly in the right lower lobe. Given interval change, this may reflect sequelae of interval infection or aspiration, however appearance is generally suspicious for fibrotic interstitial lung disease, and if characterized by ATS pulmonary fibrosis is in an indeterminate for UIP pattern, differential considerations including both UIP and NSIP. 4. Emphysema.   Aortic Atherosclerosis (ICD10-I70.0) and Emphysema (ICD10-J43.9).   01/05/2022 Pathology Results   DIAGNOSIS:   BONE MARROW, ASPIRATE, CLOT, CORE:  -Hypercellular bone marrow for age with features of  myelodysplastic/myeloproliferative neoplasm  -Minor abnormal B-cell population  -See comment   PERIPHERAL BLOOD:  -Macrocytic anemia  -Neutrophilic left shift and monocytosis  -Thrombocytopenia   COMMENT:  The bone marrow is hypercellular for age with dyspoietic changes  involving myeloid cell lines associated with monocytosis and  increased number of blastic cells (12%) as  primarily seen by morphology, many of which display monocytic features.  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  In this background, there are several predominantly small lymphoid aggregates mostly composed of small lymphoid cells.  By flow cytometry, a minor abnormal B-cell population expressing CD5 is seen and representing 2% of all cells.  This correlate with previously known B-cell lymphoproliferative process.  Correlation with cytogenetic and FISH studies is strongly recommended.    DIAGNOSIS:   -Increased number of monocytic cells present (25%)  -Minor abnormal B-cell population identified.  -See comment   COMMENT:  Flow cytometric analysis shows increased number of monocytic cells representing 25% of all cells but without aberrant phenotype or CD34 expression.  A significant CD34-positive blastic population is not identified.  The lymphoid population shows a minor B-cell population representing 2% of all cells and expressing B-cell antigens including CD20 associated with CD5, CD200 and possibly dim kappa expression.  The latter findings are abnormal and correlate with previously known B-cell lymphoproliferative process.  No significant T-cell phenotypic abnormalities identified.    01/12/2022 Initial Diagnosis   CMML (chronic myelomonocytic leukemia) (HCC)   01/12/2022 Cancer Staging   Staging form: Chronic Myeloid Leukemia, AJCC 8th Edition - Clinical stage from 01/12/2022: Bone marrow blast count (%): 12, Additional clonal changes: Unknown - Signed by [name], MD on 01/12/2022 Stage prefix: Initial diagnosis   02/06/2022 - 08/11/2022 Chemotherapy   Patient is on Treatment Plan : MYELODYSPLASIA  Azacitidine  IV D1-7 q28d     02/06/2022 -  Chemotherapy   Patient is on  Treatment Plan : MYELODYSPLASIA  Azacitidine  IV D1-7 q28d     06/03/2023 Imaging   MR lumbar spine with and without contrast IMPRESSION: 1. Long-standing presacral mass, at least doubled in size since 2016, now at least 8 cm in length. There is internal fat components and differential considerations include myelolipoma, liposarcoma, and teratoma. 2. 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 3. L2-3 left paracentral extrusion compressing the left L3 nerve root. 4. Right foraminal impingement at L3-4 to L4-5, greatest at L4-5. Right subarticular recess narrowing at L3-4 and L4-5.   07/09/2023 Imaging   CT abdomen and pelvis with contrast  IMPRESSION: No acute findings in the abdomen or pelvis. Specifically, no findings to explain the patient's history of pain. 2. Interval progression of presacral and right thoracic paraspinal soft tissue lesions compatible with mild progression of disease since 06/19/2022. Small posterior right thoracic chest wall lesion also mildly progressive. 3. Mild circumferential wall thickening distal esophagus. Esophagitis would be a consideration. 4.  Aortic Atherosclerosis (ICD10-I70.0).      Discussed the use of AI scribe software for clinical note transcription with the patient, who gave verbal consent to proceed.  History of Present Illness   The patient, a 78 year old with a history of chronic myelomonocytic leukemia (CMML), presents for a routine follow-up. She reports that her pain has improved and is now manageable with Tylenol , which she takes based on her level of activity and not daily. She has been exercising regularly and attributes her improved pain management to this increased activity. She also takes gabapentin  daily in the morning. She has stopped taking hydroxychloroquine, metyrapone, naproxen , and mirtazapine , the latter of which was prescribed for sleep and appetite. She reports no issues with appetite, eating  regularly and adequately. She also has a dental appointment scheduled for a routine check-up and cleaning.  All other systems were reviewed with the patient and are negative.  MEDICAL HISTORY:  Past Medical History:  Diagnosis Date   Arthritis    Rheumatoid arthritis   Celiac disease    Chronic kidney disease    stage 3 from MD notes   COPD (chronic obstructive pulmonary disease) (HCC)    Dyspnea    with going up stairs   Family history of adverse reaction to anesthesia    father had hard time waking up   Headache    sinus headaches   Hot flashes    Hypertension    Iron deficiency anemia    Pneumonia    per patient I have walking pneumonia    SURGICAL HISTORY: Past Surgical History:  Procedure Laterality Date   COLONOSCOPY     ECTOPIC PREGNANCY SURGERY      x 2   IR IMAGING GUIDED PORT INSERTION  01/31/2022   REVERSE SHOULDER ARTHROPLASTY Right 02/02/2017   Procedure: RIGHT REVERSE SHOULDER ARTHROPLASTY;  Surgeon: Marcey Her, MD;  Location: [facility] OR;  Service: Orthopedics;  Laterality: Right;    I have reviewed the social history and family history with the patient and they are unchanged from previous note.  ALLERGIES:  is allergic to digoxin  and related, gluten meal, penicillins, fexofenadine, alendronate, and hydroxychloroquine.  MEDICATIONS:  Current Outpatient Medications  Medication Sig Dispense Refill   acetaminophen  (TYLENOL ) 500 MG tablet Take 2 tablets (1,000 mg total) by mouth 3 (three) times daily. 30 tablet 0   apixaban  (ELIQUIS ) 5 MG TABS tablet Take 1 tablet (5 mg total) by mouth 2 (two) times daily. 60 tablet 11   denosumab  (PROLIA ) 60 MG/ML SOSY injection Inject 60 mg into the skin every 6 (six) months.     diltiazem  (CARDIZEM  CD) 360 MG 24 hr capsule Take 1 capsule (360 mg total) by mouth daily. 90 capsule 3   furosemide  (LASIX ) 20 MG tablet Take 1 tablet (20 mg total) by mouth daily. 30 tablet 11   gabapentin  (NEURONTIN ) 300 MG capsule Take  300 mg by mouth in the morning.     Metoprolol  Tartrate 75 MG TABS Take 2 tablets (150 mg total) by mouth 2 (two) times daily. 360 tablet 2   naproxen  (NAPROSYN ) 375 MG tablet Take 1 tablet (375 mg total) by mouth 2 (two) times daily as needed. (Patient not taking: Reported on 12/21/2023) 40 tablet 0   predniSONE  (DELTASONE ) 5 MG tablet Take 5 mg by mouth daily with breakfast.     Tiotropium Bromide-Olodaterol (STIOLTO RESPIMAT ) 2.5-2.5 MCG/ACT AERS Inhale 2 puffs into the lungs daily. 4 g 11   No current facility-administered medications for this visit.   Facility-Administered Medications Ordered in Other Visits  Medication Dose Route Frequency Provider Last Rate Last Admin   acetaminophen  (TYLENOL ) 325 MG tablet            diphenhydrAMINE  (BENADRYL ) 25 mg capsule            sodium chloride  flush (NS) 0.9 % injection 10 mL  10 mL Intracatheter PRN [name], MD   10 mL at 01/07/24 1605    PHYSICAL EXAMINATION: ECOG PERFORMANCE STATUS: 2 - Symptomatic, <50% confined to bed  Vitals:   01/07/24 1429  BP: 136/67  Pulse: 69  Resp: 16  Temp: 98 F (36.7 C)  SpO2: 98%   Wt Readings from Last 3 Encounters:  01/07/24 104 lb 4.8 oz (47.3 kg)  12/21/23 99 lb 12.8 oz (45.3 kg)  11/27/23 103 lb 12  oz (47.1 kg)     GENERAL:alert, no distress and comfortable, thin and frail elderly female in wheelchair  SKIN: skin color, texture, turgor are normal, no rashes or significant lesions EYES: normal, Conjunctiva are pink and non-injected, sclera clear NECK: supple, thyroid  normal size, non-tender, without nodularity LYMPH:  no palpable lymphadenopathy in the cervical, axillary  LUNGS: clear to auscultation and percussion with normal breathing effort HEART: regular rate & rhythm and no murmurs and no lower extremity edema ABDOMEN:abdomen soft, non-tender and normal bowel sounds Musculoskeletal:no cyanosis of digits and no clubbing  NEURO: alert & oriented x 3 with fluent speech, no focal  motor/sensory deficits    LABORATORY DATA:  I have reviewed the data as listed    Latest Ref Rng & Units 01/07/2024    1:47 PM 12/21/2023    1:59 PM 11/26/2023    8:26 AM  CBC  WBC 4.0 - 10.5 K/uL 14.5  13.5  14.7   Hemoglobin 12.0 - 15.0 g/dL 89.1  88.9  9.9   Hematocrit 36.0 - 46.0 % 34.1  33.9  31.8   Platelets 150 - 400 K/uL 235  146  259         Latest Ref Rng & Units 01/07/2024    1:47 PM 12/21/2023    1:59 PM 11/26/2023    8:26 AM  CMP  Glucose 70 - 99 mg/dL 800  889  852   BUN 8 - 23 mg/dL 29  23  19    Creatinine 0.44 - 1.00 mg/dL 9.05  9.03  9.06   Sodium 135 - 145 mmol/L 136  135  139   Potassium 3.5 - 5.1 mmol/L 4.3  4.8  4.1   Chloride 98 - 111 mmol/L 101  101  101   CO2 22 - 32 mmol/L 28  25  30    Calcium  8.9 - 10.3 mg/dL 9.0  8.8  9.5   Total Protein 6.5 - 8.1 g/dL  6.8    Total Bilirubin <1.2 mg/dL  0.8    Alkaline Phos 38 - 126 U/L  81    AST 15 - 41 U/L  21    ALT 0 - 44 U/L  14        RADIOGRAPHIC STUDIES: I have personally reviewed the radiological images as listed and agreed with the findings in the report. No results found.    Orders Placed This Encounter  Procedures   CBC with Differential (Cancer Center Only)    Standing Status:   Future    Expected Date:   03/31/2024    Expiration Date:   03/31/2025   Basic Metabolic Panel - Cancer Center Only    Standing Status:   Future    Expected Date:   03/31/2024    Expiration Date:   03/31/2025   All questions were answered. The patient knows to call the clinic with any problems, questions or concerns. No barriers to learning was detected. The total time spent in the appointment was 25 minutes.     Onita Mattock, MD 01/07/2024

## 2024-01-07 NOTE — Progress Notes (Signed)
 Palliative Medicine [facility]  Telephone:([phone] Fax:(336) [phone]   Name: [name] Date: 01/07/2024 MRN: [id number]  DOB: 1946-06-22  Patient Care Team: [name], MD as PCP - General (Internal Medicine) [name], [name], MD as PCP - Cardiology (Cardiology) [name], MD as Consulting Physician (Rheumatology) [name], MD as Consulting Physician (Hematology) [name], MD as Consulting Physician (Gastroenterology) [name], DO as Consulting Physician (Pulmonary Disease) Pickenpack-Cousar, [name], NP as Nurse Practitioner (Hospice and Palliative Medicine)     INTERVAL HISTORY: [name] is a 78 y.o. 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  Palliative ask to see for symptom management and goals of care.     SOCIAL HISTORY:     reports that she quit smoking about 20 years ago. Her smoking use included cigarettes. She started smoking about 50 years ago. She has a 30 pack-year smoking history. She has never used smokeless tobacco. She reports current alcohol use of about 12.0 - 14.0 standard drinks of alcohol per week. She reports that she does not use drugs.  ADVANCE DIRECTIVES:  None on file  CODE STATUS: Full code  PAST MEDICAL HISTORY: Past Medical History:  Diagnosis Date   Arthritis    Rheumatoid arthritis   Celiac disease    Chronic kidney disease    stage 3 from MD notes   COPD (chronic obstructive pulmonary disease) (HCC)    Dyspnea    with going up stairs   Family history of adverse reaction to anesthesia    father had hard time waking up   Headache    sinus headaches   Hot flashes    Hypertension    Iron deficiency anemia    Pneumonia    per patient I have walking pneumonia    ALLERGIES:  is allergic to digoxin  and related, gluten meal, penicillins, fexofenadine, alendronate, and  hydroxychloroquine.  MEDICATIONS:  Current Outpatient Medications  Medication Sig Dispense Refill   acetaminophen  (TYLENOL ) 500 MG tablet Take 2 tablets (1,000 mg total) by mouth 3 (three) times daily. 30 tablet 0   apixaban  (ELIQUIS ) 5 MG TABS tablet Take 1 tablet (5 mg total) by mouth 2 (two) times daily. 60 tablet 11   denosumab  (PROLIA ) 60 MG/ML SOSY injection Inject 60 mg into the skin every 6 (six) months.     diltiazem  (CARDIZEM  CD) 360 MG 24 hr capsule Take 1 capsule (360 mg total) by mouth daily. 90 capsule 3   furosemide  (LASIX ) 20 MG tablet Take 1 tablet (20 mg total) by mouth daily. 30 tablet 11   gabapentin  (NEURONTIN ) 300 MG capsule Take 300 mg by mouth in the morning.     HYDROmorphone  (DILAUDID ) 2 MG tablet Take 1-1.5 tablets (2-3 mg total) by mouth every 3 (three) hours as needed for severe pain (pain score 7-10) or moderate pain (pain score 4-6). (Patient not taking: Reported on 12/21/2023) 60 tablet 0   Metoprolol  Tartrate 75 MG TABS Take 2 tablets (150 mg total) by mouth 2 (two) times daily. 360 tablet 2   mirtazapine  (REMERON ) 7.5 MG tablet Take 1 tablet (7.5 mg total) by mouth at bedtime. 30 tablet 1   naproxen  (NAPROSYN ) 375 MG tablet Take 1 tablet (375 mg total) by mouth 2 (two) times daily as needed. (Patient not taking: Reported on 12/21/2023) 40 tablet 0   predniSONE  (DELTASONE ) 5 MG tablet Take 5 mg by mouth  daily with breakfast.     Tiotropium Bromide-Olodaterol (STIOLTO RESPIMAT ) 2.5-2.5 MCG/ACT AERS Inhale 2 puffs into the lungs daily. 4 g 11   No current facility-administered medications for this visit.   Facility-Administered Medications Ordered in Other Visits  Medication Dose Route Frequency Provider Last Rate Last Admin   acetaminophen  (TYLENOL ) 325 MG tablet            diphenhydrAMINE  (BENADRYL ) 25 mg capsule             VITAL SIGNS: There were no vitals taken for this visit. There were no vitals filed for this visit.  Estimated body mass index is  17.68 kg/m as calculated from the following:   Height as of 11/12/23: 5' 3 (1.6 m).   Weight as of 12/21/23: 99 lb 12.8 oz (45.3 kg).   PERFORMANCE STATUS (ECOG) : 2 - Symptomatic, <50% confined to bed Discussed the use of AI scribe software for clinical note transcription with the patient, who gave verbal consent to proceed.    IMPRESSION:  I saw Mrs. Corliss during her infusion. Tolerating well.  Denies nausea, vomiting, constipation, or diarrhea. She is taking things one day at a time.   The patient, with a history of a fractured spine, has been managing her condition with physical therapy and Tylenol .  The patient states she had a recent visit for her fractured spine and signed a document indicating she was finished with her current round of physical therapy, unless she needed to start another round.  Overall, her pain is much improved.  She reports taking Tylenol  twice on the day of the visit, although she typically only takes it once daily. No new concerns reported.   Patient aware we are available as needed.  PLAN:  Fractured Spine/Presacral tumor  Patient reports completion of a round of physical therapy and is managing pain with Tylenol  as needed, sometimes twice a day. -Continue current pain management strategy with Tylenol  as needed. -Consider restarting physical therapy if pain increases or mobility decreases. -No longer requiring opioid medications for her pain regularly.   General Health Maintenance Patient is receiving regular home health visits. -Continue with current home health visit schedule. -Encourage patient to call if any new issues arise. -I will plan to see patient in 6-8 weeks. Sooner if needed. Patient expressed understanding and was in agreement with this plan. She also understands that She can call the clinic at any time with any questions, concerns, or complaints.   Any controlled substances utilized were prescribed in the context of palliative care.  PDMP has been reviewed.    Visit consisted of counseling and education dealing with the complex and emotionally intense issues of symptom management and palliative care in the setting of serious and potentially life-threatening illness.  Levon Borer, AGPCNP-BC  Palliative Medicine Team/[facility]

## 2024-01-08 ENCOUNTER — Inpatient Hospital Stay: Payer: Medicare Other

## 2024-01-08 VITALS — BP 128/68 | HR 68 | Temp 98.2°F | Resp 18

## 2024-01-08 DIAGNOSIS — C9310 Chronic myelomonocytic leukemia not having achieved remission: Secondary | ICD-10-CM | POA: Diagnosis not present

## 2024-01-08 DIAGNOSIS — Z79899 Other long term (current) drug therapy: Secondary | ICD-10-CM | POA: Diagnosis not present

## 2024-01-08 DIAGNOSIS — Z5111 Encounter for antineoplastic chemotherapy: Secondary | ICD-10-CM | POA: Diagnosis not present

## 2024-01-08 MED ORDER — SODIUM CHLORIDE 0.9 % IV SOLN
75.0000 mg/m2 | Freq: Once | INTRAVENOUS | Status: AC
  Administered 2024-01-08: 114 mg via INTRAVENOUS
  Filled 2024-01-08: qty 11.4

## 2024-01-08 MED ORDER — ONDANSETRON HCL 4 MG/2ML IJ SOLN
8.0000 mg | Freq: Once | INTRAMUSCULAR | Status: AC
  Administered 2024-01-08: 8 mg via INTRAVENOUS
  Filled 2024-01-08: qty 4

## 2024-01-08 MED ORDER — SODIUM CHLORIDE 0.9 % IV SOLN: Freq: Once | INTRAVENOUS | Status: AC

## 2024-01-09 ENCOUNTER — Inpatient Hospital Stay: Payer: Medicare Other

## 2024-01-09 VITALS — BP 124/57 | HR 64 | Temp 97.8°F | Resp 16

## 2024-01-09 DIAGNOSIS — Z79899 Other long term (current) drug therapy: Secondary | ICD-10-CM | POA: Diagnosis not present

## 2024-01-09 DIAGNOSIS — Z5111 Encounter for antineoplastic chemotherapy: Secondary | ICD-10-CM | POA: Diagnosis not present

## 2024-01-09 DIAGNOSIS — C9310 Chronic myelomonocytic leukemia not having achieved remission: Secondary | ICD-10-CM | POA: Diagnosis not present

## 2024-01-09 MED ORDER — ONDANSETRON HCL 4 MG/2ML IJ SOLN
8.0000 mg | Freq: Once | INTRAMUSCULAR | Status: AC
  Administered 2024-01-09: 8 mg via INTRAVENOUS
  Filled 2024-01-09: qty 4

## 2024-01-09 MED ORDER — SODIUM CHLORIDE 0.9 % IV SOLN: Freq: Once | INTRAVENOUS | Status: AC

## 2024-01-09 MED ORDER — HEPARIN SOD (PORK) LOCK FLUSH 100 UNIT/ML IV SOLN
500.0000 [IU] | Freq: Once | INTRAVENOUS | Status: AC | PRN
  Administered 2024-01-09: 500 [IU]

## 2024-01-09 MED ORDER — SODIUM CHLORIDE 0.9% FLUSH
10.0000 mL | INTRAVENOUS | Status: DC | PRN
  Administered 2024-01-09: 10 mL

## 2024-01-09 MED ORDER — SODIUM CHLORIDE 0.9 % IV SOLN
75.0000 mg/m2 | Freq: Once | INTRAVENOUS | Status: AC
  Administered 2024-01-09: 114 mg via INTRAVENOUS
  Filled 2024-01-09: qty 11.4

## 2024-01-09 NOTE — Patient Instructions (Signed)
 [facility] - A DEPT OF [facility]  Discharge Instructions: Thank you for choosing [facility] to provide your oncology and hematology care.   If you have a lab appointment with the Cancer Center, please go directly to the Cancer Center and check in at the registration area.   Wear comfortable clothing and clothing appropriate for easy access to any Portacath or PICC line.   We strive to give you quality time with your provider. You may need to reschedule your appointment if you arrive late (15 or more minutes).  Arriving late affects you and other patients whose appointments are after yours.  Also, if you miss three or more appointments without notifying the office, you may be dismissed from the clinic at the provider's discretion.      For prescription refill requests, have your pharmacy contact our office and allow 72 hours for refills to be completed.    Today you received the following chemotherapy and/or immunotherapy agents: Vidaza      To help prevent nausea and vomiting after your treatment, we encourage you to take your nausea medication as directed.  BELOW ARE SYMPTOMS THAT SHOULD BE REPORTED IMMEDIATELY: *FEVER GREATER THAN 100.4 F (38 C) OR HIGHER *CHILLS OR SWEATING *NAUSEA AND VOMITING THAT IS NOT CONTROLLED WITH YOUR NAUSEA MEDICATION *UNUSUAL SHORTNESS OF BREATH *UNUSUAL BRUISING OR BLEEDING *URINARY PROBLEMS (pain or burning when urinating, or frequent urination) *BOWEL PROBLEMS (unusual diarrhea, constipation, pain near the anus) TENDERNESS IN MOUTH AND THROAT WITH OR WITHOUT PRESENCE OF ULCERS (sore throat, sores in mouth, or a toothache) UNUSUAL RASH, SWELLING OR PAIN  UNUSUAL VAGINAL DISCHARGE OR ITCHING   Items with * indicate a potential emergency and should be followed up as soon as possible or go to the Emergency Department if any problems should occur.  Please show the CHEMOTHERAPY ALERT CARD or IMMUNOTHERAPY  ALERT CARD at check-in to the Emergency Department and triage nurse.  Should you have questions after your visit or need to cancel or reschedule your appointment, please contact [facility] - A DEPT OF [facility]  Dept: [phone]  and follow the prompts.  Office hours are 8:00 a.m. to 4:30 p.m. Monday - Friday. Please note that voicemails left after 4:00 p.m. may not be returned until the following business day.  We are closed weekends and major holidays. You have access to a nurse at all times for urgent questions. Please call the main number to the clinic Dept: [phone] and follow the prompts.   For any non-urgent questions, you may also contact your provider using MyChart. We now offer e-Visits for anyone 5 and older to request care online for non-urgent symptoms. For details visit [email or web address].   Also download the MyChart app! Go to the app store, search "MyChart", open the app, select [facility], and log in with your MyChart username and password.

## 2024-01-10 ENCOUNTER — Inpatient Hospital Stay: Payer: Medicare Other

## 2024-01-10 VITALS — BP 139/62 | HR 72 | Temp 97.7°F | Resp 18

## 2024-01-10 DIAGNOSIS — Z5111 Encounter for antineoplastic chemotherapy: Secondary | ICD-10-CM | POA: Diagnosis not present

## 2024-01-10 DIAGNOSIS — C9310 Chronic myelomonocytic leukemia not having achieved remission: Secondary | ICD-10-CM

## 2024-01-10 DIAGNOSIS — Z79899 Other long term (current) drug therapy: Secondary | ICD-10-CM | POA: Diagnosis not present

## 2024-01-10 MED ORDER — HEPARIN SOD (PORK) LOCK FLUSH 100 UNIT/ML IV SOLN
500.0000 [IU] | Freq: Once | INTRAVENOUS | Status: AC | PRN
  Administered 2024-01-10: 500 [IU]

## 2024-01-10 MED ORDER — SODIUM CHLORIDE 0.9% FLUSH
10.0000 mL | INTRAVENOUS | Status: DC | PRN
  Administered 2024-01-10: 10 mL

## 2024-01-10 MED ORDER — ONDANSETRON HCL 4 MG/2ML IJ SOLN
8.0000 mg | Freq: Once | INTRAMUSCULAR | Status: AC
  Administered 2024-01-10: 8 mg via INTRAVENOUS
  Filled 2024-01-10: qty 4

## 2024-01-10 MED ORDER — SODIUM CHLORIDE 0.9 % IV SOLN: Freq: Once | INTRAVENOUS | Status: AC

## 2024-01-10 MED ORDER — SODIUM CHLORIDE 0.9 % IV SOLN
75.0000 mg/m2 | Freq: Once | INTRAVENOUS | Status: AC
  Administered 2024-01-10: 114 mg via INTRAVENOUS
  Filled 2024-01-10: qty 11.4

## 2024-01-10 NOTE — Patient Instructions (Signed)
 [facility] - A DEPT OF [facility]  Discharge Instructions: Thank you for choosing [facility] to provide your oncology and hematology care.   If you have a lab appointment with the Cancer Center, please go directly to the Cancer Center and check in at the registration area.   Wear comfortable clothing and clothing appropriate for easy access to any Portacath or PICC line.   We strive to give you quality time with your provider. You may need to reschedule your appointment if you arrive late (15 or more minutes).  Arriving late affects you and other patients whose appointments are after yours.  Also, if you miss three or more appointments without notifying the office, you may be dismissed from the clinic at the provider's discretion.      For prescription refill requests, have your pharmacy contact our office and allow 72 hours for refills to be completed.    Today you received the following chemotherapy and/or immunotherapy agents Vidaza      To help prevent nausea and vomiting after your treatment, we encourage you to take your nausea medication as directed.  BELOW ARE SYMPTOMS THAT SHOULD BE REPORTED IMMEDIATELY: *FEVER GREATER THAN 100.4 F (38 C) OR HIGHER *CHILLS OR SWEATING *NAUSEA AND VOMITING THAT IS NOT CONTROLLED WITH YOUR NAUSEA MEDICATION *UNUSUAL SHORTNESS OF BREATH *UNUSUAL BRUISING OR BLEEDING *URINARY PROBLEMS (pain or burning when urinating, or frequent urination) *BOWEL PROBLEMS (unusual diarrhea, constipation, pain near the anus) TENDERNESS IN MOUTH AND THROAT WITH OR WITHOUT PRESENCE OF ULCERS (sore throat, sores in mouth, or a toothache) UNUSUAL RASH, SWELLING OR PAIN  UNUSUAL VAGINAL DISCHARGE OR ITCHING   Items with * indicate a potential emergency and should be followed up as soon as possible or go to the Emergency Department if any problems should occur.  Please show the CHEMOTHERAPY ALERT CARD or IMMUNOTHERAPY  ALERT CARD at check-in to the Emergency Department and triage nurse.  Should you have questions after your visit or need to cancel or reschedule your appointment, please contact [facility] - A DEPT OF [facility]  Dept: [phone]  and follow the prompts.  Office hours are 8:00 a.m. to 4:30 p.m. Monday - Friday. Please note that voicemails left after 4:00 p.m. may not be returned until the following business day.  We are closed weekends and major holidays. You have access to a nurse at all times for urgent questions. Please call the main number to the clinic Dept: [phone] and follow the prompts.   For any non-urgent questions, you may also contact your provider using MyChart. We now offer e-Visits for anyone 72 and older to request care online for non-urgent symptoms. For details visit [email or web address].   Also download the MyChart app! Go to the app store, search "MyChart", open the app, select [place], and log in with your MyChart username and password.

## 2024-01-11 ENCOUNTER — Inpatient Hospital Stay: Payer: Medicare Other

## 2024-01-11 VITALS — BP 140/59 | HR 65 | Temp 97.7°F | Resp 18 | Wt 101.0 lb

## 2024-01-11 DIAGNOSIS — Z5111 Encounter for antineoplastic chemotherapy: Secondary | ICD-10-CM | POA: Diagnosis not present

## 2024-01-11 DIAGNOSIS — Z79899 Other long term (current) drug therapy: Secondary | ICD-10-CM | POA: Diagnosis not present

## 2024-01-11 DIAGNOSIS — C9310 Chronic myelomonocytic leukemia not having achieved remission: Secondary | ICD-10-CM

## 2024-01-11 MED ORDER — SODIUM CHLORIDE 0.9 % IV SOLN
75.0000 mg/m2 | Freq: Once | INTRAVENOUS | Status: AC
  Administered 2024-01-11: 114 mg via INTRAVENOUS
  Filled 2024-01-11: qty 11.4

## 2024-01-11 MED ORDER — ONDANSETRON HCL 4 MG/2ML IJ SOLN
8.0000 mg | Freq: Once | INTRAMUSCULAR | Status: AC
  Administered 2024-01-11: 8 mg via INTRAVENOUS
  Filled 2024-01-11: qty 4

## 2024-01-11 MED ORDER — SODIUM CHLORIDE 0.9 % IV SOLN
Freq: Once | INTRAVENOUS | Status: AC
Start: 2024-01-11 — End: 2024-01-11

## 2024-01-11 MED ORDER — SODIUM CHLORIDE 0.9% FLUSH
10.0000 mL | INTRAVENOUS | Status: DC | PRN
  Administered 2024-01-11: 10 mL

## 2024-01-11 MED ORDER — HEPARIN SOD (PORK) LOCK FLUSH 100 UNIT/ML IV SOLN
500.0000 [IU] | Freq: Once | INTRAVENOUS | Status: AC | PRN
  Administered 2024-01-11: 500 [IU]

## 2024-01-11 NOTE — Patient Instructions (Signed)
 [facility] - A DEPT OF [facility]  Discharge Instructions: Thank you for choosing [facility] to provide your oncology and hematology care.   If you have a lab appointment with the Cancer Center, please go directly to the Cancer Center and check in at the registration area.   Wear comfortable clothing and clothing appropriate for easy access to any Portacath or PICC line.   We strive to give you quality time with your provider. You may need to reschedule your appointment if you arrive late (15 or more minutes).  Arriving late affects you and other patients whose appointments are after yours.  Also, if you miss three or more appointments without notifying the office, you may be dismissed from the clinic at the provider's discretion.      For prescription refill requests, have your pharmacy contact our office and allow 72 hours for refills to be completed.    Today you received the following chemotherapy and/or immunotherapy agents Vidaza      To help prevent nausea and vomiting after your treatment, we encourage you to take your nausea medication as directed.  BELOW ARE SYMPTOMS THAT SHOULD BE REPORTED IMMEDIATELY: *FEVER GREATER THAN 100.4 F (38 C) OR HIGHER *CHILLS OR SWEATING *NAUSEA AND VOMITING THAT IS NOT CONTROLLED WITH YOUR NAUSEA MEDICATION *UNUSUAL SHORTNESS OF BREATH *UNUSUAL BRUISING OR BLEEDING *URINARY PROBLEMS (pain or burning when urinating, or frequent urination) *BOWEL PROBLEMS (unusual diarrhea, constipation, pain near the anus) TENDERNESS IN MOUTH AND THROAT WITH OR WITHOUT PRESENCE OF ULCERS (sore throat, sores in mouth, or a toothache) UNUSUAL RASH, SWELLING OR PAIN  UNUSUAL VAGINAL DISCHARGE OR ITCHING   Items with * indicate a potential emergency and should be followed up as soon as possible or go to the Emergency Department if any problems should occur.  Please show the CHEMOTHERAPY ALERT CARD or IMMUNOTHERAPY  ALERT CARD at check-in to the Emergency Department and triage nurse.  Should you have questions after your visit or need to cancel or reschedule your appointment, please contact [facility] - A DEPT OF [facility]  Dept: [phone]  and follow the prompts.  Office hours are 8:00 a.m. to 4:30 p.m. Monday - Friday. Please note that voicemails left after 4:00 p.m. may not be returned until the following business day.  We are closed weekends and major holidays. You have access to a nurse at all times for urgent questions. Please call the main number to the clinic Dept: [phone] and follow the prompts.   For any non-urgent questions, you may also contact your provider using MyChart. We now offer e-Visits for anyone 72 and older to request care online for non-urgent symptoms. For details visit [email or web address].   Also download the MyChart app! Go to the app store, search "MyChart", open the app, select [place], and log in with your MyChart username and password.

## 2024-02-14 NOTE — Assessment & Plan Note (Signed)
 diagnosed in 10/2021 ---She developed worsening back pain, pelvic MRI 07/04/21 showed a mass in the presacral space. 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  -She began azacitadine on 02/06/22. she has had multiple hospital admissions and her performance status has been very low.  Changed her chemo to every 6 weeks in 08/2022 due to her fatigue. -she is tolerating every 6 weeks much better -she saw Dr. Sharyne Richters in Feb 2024, who recommends to continue current therapy  -Due to worsening low back pain, she completed palliative radiation on 07/02/2023 -due to worsening low back pain, she was hospitalized on November 12, 2023.  Currently follow-up with palliative care for pain control.

## 2024-02-18 ENCOUNTER — Inpatient Hospital Stay: Payer: Medicare Other

## 2024-02-18 ENCOUNTER — Encounter: Payer: Self-pay | Admitting: Hematology

## 2024-02-18 ENCOUNTER — Inpatient Hospital Stay (HOSPITAL_BASED_OUTPATIENT_CLINIC_OR_DEPARTMENT_OTHER): Payer: Medicare Other | Admitting: Hematology

## 2024-02-18 ENCOUNTER — Inpatient Hospital Stay: Payer: Medicare Other | Attending: Nurse Practitioner

## 2024-02-18 VITALS — BP 116/43 | HR 62 | Temp 98.3°F | Resp 15 | Wt 105.4 lb

## 2024-02-18 DIAGNOSIS — Z5111 Encounter for antineoplastic chemotherapy: Secondary | ICD-10-CM | POA: Insufficient documentation

## 2024-02-18 DIAGNOSIS — C9310 Chronic myelomonocytic leukemia not having achieved remission: Secondary | ICD-10-CM | POA: Insufficient documentation

## 2024-02-18 DIAGNOSIS — Z95828 Presence of other vascular implants and grafts: Secondary | ICD-10-CM

## 2024-02-18 DIAGNOSIS — Z79899 Other long term (current) drug therapy: Secondary | ICD-10-CM | POA: Insufficient documentation

## 2024-02-18 LAB — BASIC METABOLIC PANEL - CANCER CENTER ONLY
Anion gap: 7 (ref 5–15)
BUN: 19 mg/dL (ref 8–23)
CO2: 29 mmol/L (ref 22–32)
Calcium: 8.9 mg/dL (ref 8.9–10.3)
Chloride: 104 mmol/L (ref 98–111)
Creatinine: 0.99 mg/dL (ref 0.44–1.00)
GFR, Estimated: 59 mL/min — ABNORMAL LOW (ref >60)
Glucose, Bld: 116 mg/dL — ABNORMAL HIGH (ref 70–99)
Potassium: 4.4 mmol/L (ref 3.5–5.1)
Sodium: 140 mmol/L (ref 135–145)

## 2024-02-18 LAB — CBC WITH DIFFERENTIAL (CANCER CENTER ONLY)
Abs Immature Granulocytes: 0.58 10*3/uL — ABNORMAL HIGH (ref 0.00–0.07)
Basophils Absolute: 0.3 10*3/uL — ABNORMAL HIGH (ref 0.0–0.1)
Basophils Relative: 2 %
Eosinophils Absolute: 0.5 10*3/uL (ref 0.0–0.5)
Eosinophils Relative: 3 %
HCT: 34 % — ABNORMAL LOW (ref 36.0–46.0)
Hemoglobin: 10.9 g/dL — ABNORMAL LOW (ref 12.0–15.0)
Immature Granulocytes: 3 %
Lymphocytes Relative: 8 %
Lymphs Abs: 1.4 10*3/uL (ref 0.7–4.0)
MCH: 32.9 pg (ref 26.0–34.0)
MCHC: 32.1 g/dL (ref 30.0–36.0)
MCV: 102.7 fL — ABNORMAL HIGH (ref 80.0–100.0)
Monocytes Absolute: 2.7 10*3/uL — ABNORMAL HIGH (ref 0.1–1.0)
Monocytes Relative: 16 %
Neutro Abs: 11.8 10*3/uL — ABNORMAL HIGH (ref 1.7–7.7)
Neutrophils Relative %: 68 %
Platelet Count: 216 10*3/uL (ref 150–400)
RBC: 3.31 MIL/uL — ABNORMAL LOW (ref 3.87–5.11)
RDW: 14.6 % (ref 11.5–15.5)
WBC Count: 17.1 10*3/uL — ABNORMAL HIGH (ref 4.0–10.5)
nRBC: 0 % (ref 0.0–0.2)

## 2024-02-18 MED ORDER — HEPARIN SOD (PORK) LOCK FLUSH 100 UNIT/ML IV SOLN
500.0000 [IU] | Freq: Once | INTRAVENOUS | Status: AC | PRN
  Administered 2024-02-18: 500 [IU]

## 2024-02-18 MED ORDER — SODIUM CHLORIDE 0.9 % IV SOLN: Freq: Once | INTRAVENOUS | Status: AC

## 2024-02-18 MED ORDER — SODIUM CHLORIDE 0.9% FLUSH
10.0000 mL | INTRAVENOUS | Status: DC | PRN
  Administered 2024-02-18: 10 mL

## 2024-02-18 MED ORDER — SODIUM CHLORIDE 0.9 % IV SOLN
75.0000 mg/m2 | Freq: Once | INTRAVENOUS | Status: AC
  Administered 2024-02-18: 114 mg via INTRAVENOUS
  Filled 2024-02-18: qty 11.4

## 2024-02-18 MED ORDER — SODIUM CHLORIDE 0.9% FLUSH
10.0000 mL | Freq: Once | INTRAVENOUS | Status: AC
  Administered 2024-02-18: 10 mL

## 2024-02-18 MED ORDER — ONDANSETRON HCL 4 MG/2ML IJ SOLN
8.0000 mg | Freq: Once | INTRAMUSCULAR | Status: AC
  Administered 2024-02-18: 8 mg via INTRAVENOUS
  Filled 2024-02-18: qty 4

## 2024-02-18 NOTE — Progress Notes (Signed)
 [facility]   [phone] [phone]   Clinic Follow up Note   Patient Care Team: [name], MD as PCP - General (Internal Medicine) [name], Deloris Ping, MD as PCP - Cardiology (Cardiology) Zenovia Jordan, MD as Consulting Physician (Rheumatology) Malachy Mood, MD as Consulting Physician (Hematology) Charna Elizabeth, MD as Consulting Physician (Gastroenterology) Tonia Brooms Rachel Bo, DO as Consulting Physician (Pulmonary Disease) Pickenpack-Cousar, Arty Baumgartner, NP as Nurse Practitioner [facility])  Date of Service:  02/18/2024  CHIEF COMPLAINT: f/u of CMML  CURRENT THERAPY:  Vidaza on date 1-5 every 6 weeks  Oncology History   CMML (chronic myelomonocytic leukemia) (HCC) diagnosed in 10/2021 ---She developed worsening back pain, pelvic MRI 07/04/21 showed a mass in the presacral space. 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 Her BM biopsy was reviewed at [facility] and was felt to be CMML-1 with 5% blasts.  -She began azacitadine on 02/06/22. she has had multiple hospital admissions and her performance status has been very low.  Changed her chemo to every 6 weeks in 08/2022 due to her fatigue. -she is tolerating every 6 weeks much better -she saw Dr. Sharyne Richters in Feb 2024, who recommends to continue current therapy  -Due to worsening low back pain, she completed palliative radiation on 07/02/2023 -due to worsening low back pain, she was hospitalized on November 12, 2023.  Currently follow-up with palliative care for pain control.   Assessment and Plan    Chronic Myelomonocytic Leukemia (CMML) Follow-up for CMML. Symptoms well-managed, no recent hospitalizations or ER visits. Hemoglobin 10.9, WBC 17, platelets normal. No recent transfusions. Prefers virtual follow-up  with Dr. Sharyne Richters. - Administer scheduled treatment today - Next treatment session on April 7th - Monitor blood counts regularly - Advise to call if experiencing extreme fatigue or worsening pain - Arrange virtual follow-up with Dr. Sharyne Richters  Nausea and Vomiting Intermittent nausea and vomiting post-meals, etiology unclear. - Monitor symptoms - Report if symptoms worsen or become more frequent  Low Back Pain Low back pain in the tailbone area, improved. Uses Tylenol occasionally, avoids NSAIDs due to blood thinner use. - Continue Tylenol as needed - Avoid NSAIDs like naproxen  Neuropathy Taking prednisone 5 mg daily for symptom management. - Continue prednisone 5 mg daily  General Health Maintenance On metoprolol and Eliquis, kidney function normal. - Continue metoprolol and Eliquis as prescribed  Plan -Lab reviewed, adequate for treatment, will proceed Vidaza today and continue daily for the next 4 days - Schedule next in-person follow-up in six weeks before next cycle chemo.         SUMMARY OF ONCOLOGIC HISTORY: Oncology History Overview Note   Cancer Staging  CMML (chronic myelomonocytic leukemia) (HCC) Staging form: Chronic Myeloid Leukemia, AJCC 8th Edition - Clinical stage from 01/12/2022: Bone marrow blast count (%): 12, Additional clonal changes: Unknown - Signed by Malachy Mood, MD on 01/12/2022 Stage prefix: Initial diagnosis     CMML (chronic myelomonocytic leukemia) (HCC)  11/09/2021 Initial Biopsy   DIAGNOSIS:   -  Monoclonal B-cell population with co-expression of CD5 comprises 17%  of all lymphocytes  -  See comment   COMMENT:  In addition to the clonal B-cell population, there is a myeloblast  population (CD34, CD38, HLA-DR, CD117, CD123 and CD33) that comprises 2% of the total cellular events.  Please see concurrent tissue biopsy (below) for additional work-up  and final diagnosis.    FINAL MICROSCOPIC DIAGNOSIS:   A. SOFT TISSUE MASS, PRE SACRAL,  NEEDLE CORE BIOPSY:  -  Chronic lymphocytic leukemia/small lymphocytic lymphoma  -  Extra medullary hematopoiesis  -  See comment   COMMENT:  The biopsy consists of multiple soft tissue cores with lymphoid nodules and a dense hematopoietic infiltrate consistent with extra medullary hematopoiesis.  MPO and E-cadherin highlight myeloid and erythroid precursors respectively.  CD34 highlights increased vasculature and is also positive within the cytoplasm of megakaryocytes.  A few small, immature mononuclear cells appear to be positive for CD34 and CD117. TdT shows rare, scattered positive cells.  CD20 highlights aggregates of B cells which are admixed with CD3 positive T cells.  T cells are an admixture of CD4 and CD8.  The B cells are also positive for CD5, CD23 and Bcl-2.  The B cells do not show significant staining for CD10, BCL6 or cyclin D1.  CD138 highlights scattered plasma cells which are polytypic by kappa and lambda in situ hybridization.  Flow cytometry performed on the sample (see WL [id number]) identified a kappa restricted CD5 positive B-cell population comprising 70% of lymphocytes.  In addition, a small myeloblast population comprised 2% of the total cellular events.   Overall, the findings are consistent with soft tissue involvement by  chronic lymphocytic leukemia/small lymphocytic lymphoma and extra medullary hematopoiesis. In reviewing the patient's CBC data (macrocytic anemia and thrombocytopenia), I would recommend a bone marrow biopsy to assess for marrow involvement by CLL/SLL.    12/23/2021 Imaging   EXAM: CT CHEST, ABDOMEN, AND PELVIS WITH CONTRAST  IMPRESSION: 1. Slight interval enlargement of a presacral soft tissue mass measuring 7.2 x 4.7 cm, previously 6.9 x 4.1 cm on prior MR of the pelvis dated 07/04/2021. By report, this represents a biopsy proven lymphoma. 2. Pleural nodule of the dependent right lower lobe overlying the posterior right tenth rib and pleural or  paraspinous soft tissue mass overlying the right aspect of the T10 vertebral body, very slightly enlarged compared to prior examination of the chest dated 06/25/2020, consistent with additional sites of lymphomatous involvement given very indolent growth. These could be better assessed for metabolic activity by FDG PET/CT if desired. 3. There is mild, bibasilar predominant pulmonary fibrosis in a pattern featuring irregular peripheral interstitial opacity, septal thickening, but without clear evidence of subpleural bronchiolectasis or honeycombing, with a somewhat asymmetric distribution most conspicuously involving the right lower lobe and lingula. These findings are significantly worsened when compared to prior examination dated 06/25/2020, particularly in the right lower lobe. Given interval change, this may reflect sequelae of interval infection or aspiration, however appearance is generally suspicious for fibrotic interstitial lung disease, and if characterized by ATS pulmonary fibrosis is in an "indeterminate for UIP" pattern, differential considerations including both UIP and NSIP. 4. Emphysema.   Aortic Atherosclerosis (ICD10-I70.0) and Emphysema (ICD10-J43.9).   01/05/2022 Pathology Results   DIAGNOSIS:   BONE MARROW, ASPIRATE, CLOT, CORE:  -Hypercellular bone marrow for age with features of  myelodysplastic/myeloproliferative neoplasm  -Minor abnormal B-cell population  -See comment   PERIPHERAL BLOOD:  -Macrocytic anemia  -Neutrophilic left shift and monocytosis  -Thrombocytopenia   COMMENT:  The bone marrow is hypercellular for age with dyspoietic changes  involving myeloid cell lines associated with monocytosis and increased number of blastic cells (12%) as primarily seen by morphology, many of which display monocytic features.  Given the overall features and particularly in the presence of peripheral monocytosis, the findings are consistent  with  myelodysplastic/myeloproliferative neoplasm particularly chronic myelomonocytic leukemia (CMML-2).  In this background, there are several predominantly small lymphoid aggregates mostly composed of small lymphoid cells.  By flow cytometry, a minor abnormal B-cell population expressing CD5 is seen and representing 2% of all cells.  This correlate with previously known B-cell lymphoproliferative process.  Correlation with cytogenetic and FISH studies is strongly recommended.    DIAGNOSIS:   -Increased number of monocytic cells present (25%)  -Minor abnormal B-cell population identified.  -See comment   COMMENT:  Flow cytometric analysis shows increased number of monocytic cells representing 25% of all cells but without aberrant phenotype or CD34 expression.  A significant CD34-positive blastic population is not identified.  The lymphoid population shows a minor B-cell population representing 2% of all cells and expressing B-cell antigens including CD20 associated with CD5, CD200 and possibly dim kappa expression.  The latter findings are abnormal and correlate with previously known B-cell lymphoproliferative process.  No significant T-cell phenotypic abnormalities identified.    01/12/2022 Initial Diagnosis   CMML (chronic myelomonocytic leukemia) (HCC)   01/12/2022 Cancer Staging   Staging form: Chronic Myeloid Leukemia, AJCC 8th Edition - Clinical stage from 01/12/2022: Bone marrow blast count (%): 12, Additional clonal changes: Unknown - Signed by Malachy Mood, MD on 01/12/2022 Stage prefix: Initial diagnosis   02/06/2022 - 08/11/2022 Chemotherapy   Patient is on Treatment Plan : MYELODYSPLASIA  Azacitidine IV D1-7 q28d     02/06/2022 -  Chemotherapy   Patient is on Treatment Plan : MYELODYSPLASIA  Azacitidine IV D1-7 q28d     06/03/2023 Imaging   MR lumbar spine with and without contrast IMPRESSION: 1. Long-standing presacral mass, at least doubled in size since 2016, now at least 8 cm in length.  There is internal fat components and differential considerations include myelolipoma, liposarcoma, and teratoma. 2. 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 3. L2-3 left paracentral extrusion compressing the left L3 nerve root. 4. Right foraminal impingement at L3-4 to L4-5, greatest at L4-5. Right subarticular recess narrowing at L3-4 and L4-5.   07/09/2023 Imaging   CT abdomen and pelvis with contrast  IMPRESSION: No acute findings in the abdomen or pelvis. Specifically, no findings to explain the patient's history of pain. 2. Interval progression of presacral and right thoracic paraspinal soft tissue lesions compatible with mild progression of disease since 06/19/2022. Small posterior right thoracic chest wall lesion also mildly progressive. 3. Mild circumferential wall thickening distal esophagus. Esophagitis would be a consideration. 4.  Aortic Atherosclerosis (ICD10-I70.0).      Discussed the use of AI scribe software for clinical note transcription with the patient, who gave verbal consent to proceed.  History of Present Illness   A 78 year old patient with a history of Chronic Myelomonocytic Leukemia (CMML) presents for a follow-up visit. She reports an improvement in her appetite and has gained weight since her last visit. However, she experiences occasional episodes of unexplained vomiting. She also reports a reduction in her back pain, which is now not severe enough to require daily Tylenol. She took some Tylenol upon arrival at the clinic and last took it several days prior. She has concerns about her medication regimen, specifically Naproxen, which she has stopped taking due to potential interactions with her blood thinner. She continues to take Prednisone for neuropathy and reports that it wouldn't be detrimental if she didn't take it.         All other systems were reviewed with the patient and are negative.  MEDICAL HISTORY:  Past  Medical History:  Diagnosis Date   Arthritis    Rheumatoid arthritis   Celiac disease    Chronic kidney disease    stage 3 from MD notes   COPD (chronic obstructive pulmonary disease) (HCC)    Dyspnea    with going up stairs   Family history of adverse reaction to anesthesia    father had hard time waking up   Headache    sinus headaches   Hot flashes    Hypertension    Iron deficiency anemia    Pneumonia    per patient "I have walking pneumonia"    SURGICAL HISTORY: Past Surgical History:  Procedure Laterality Date   COLONOSCOPY     ECTOPIC PREGNANCY SURGERY      x 2   IR IMAGING GUIDED PORT INSERTION  01/31/2022   REVERSE SHOULDER ARTHROPLASTY Right 02/02/2017   Procedure: RIGHT REVERSE SHOULDER ARTHROPLASTY;  Surgeon: Beverely Low, MD;  Location: [facility] OR;  Service: Orthopedics;  Laterality: Right;    I have reviewed the social history and family history with the patient and they are unchanged from previous note.  ALLERGIES:  is allergic to digoxin and related, gluten meal, penicillins, fexofenadine, alendronate, and hydroxychloroquine.  MEDICATIONS:  Current Outpatient Medications  Medication Sig Dispense Refill   acetaminophen (TYLENOL) 500 MG tablet Take 2 tablets (1,000 mg total) by mouth 3 (three) times daily. 30 tablet 0   apixaban (ELIQUIS) 5 MG TABS tablet Take 1 tablet (5 mg total) by mouth 2 (two) times daily. 60 tablet 11   denosumab (PROLIA) 60 MG/ML SOSY injection Inject 60 mg into the skin every 6 (six) months.     diltiazem (CARDIZEM CD) 360 MG 24 hr capsule Take 1 capsule (360 mg total) by mouth daily. 90 capsule 3   furosemide (LASIX) 20 MG tablet Take 1 tablet (20 mg total) by mouth daily. 30 tablet 11   gabapentin (NEURONTIN) 300 MG capsule Take 300 mg by mouth in the morning.     Metoprolol Tartrate 75 MG TABS Take 2 tablets (150 mg total) by mouth 2 (two) times daily. 360 tablet 2   predniSONE (DELTASONE) 5 MG tablet Take 5 mg by mouth daily with  breakfast.     Tiotropium Bromide-Olodaterol (STIOLTO RESPIMAT) 2.5-2.5 MCG/ACT AERS Inhale 2 puffs into the lungs daily. 4 g 11   No current facility-administered medications for this visit.   Facility-Administered Medications Ordered in Other Visits  Medication Dose Route Frequency Provider Last Rate Last Admin   acetaminophen (TYLENOL) 325 MG tablet            diphenhydrAMINE (BENADRYL) 25 mg capsule            sodium chloride flush (NS) 0.9 % injection 10 mL  10 mL Intracatheter PRN Malachy Mood, MD   10 mL at 02/18/24 1452    PHYSICAL EXAMINATION: ECOG PERFORMANCE STATUS: 2 - Symptomatic, <50% confined to bed  Vitals:   02/18/24 1304  BP: (!) 116/43  Pulse: 62  Resp: 15  Temp: 98.3 F (36.8 C)  SpO2: 98%   Wt Readings from Last 3 Encounters:  02/18/24 105 lb 6.4 oz (47.8 kg)  01/11/24 101 lb (45.8 kg)  01/07/24 104 lb 4.8 oz (47.3 kg)     GENERAL:alert, no distress and comfortable SKIN: skin color, texture, turgor are normal, no rashes or significant lesions EYES: normal, Conjunctiva are pink and non-injected, sclera clear NECK: supple, thyroid normal size, non-tender, without nodularity LYMPH:  no palpable lymphadenopathy in the cervical, axillary  LUNGS: clear to auscultation and percussion with normal breathing effort HEART: regular rate & rhythm and no murmurs and no lower extremity edema ABDOMEN:abdomen soft, non-tender and normal bowel sounds Musculoskeletal:no cyanosis of digits and no clubbing  NEURO: alert & oriented x 3 with fluent speech, no focal motor/sensory deficits    LABORATORY DATA:  I have reviewed the data as listed    Latest Ref Rng & Units 02/18/2024   12:36 PM 01/07/2024    1:47 PM 12/21/2023    1:59 PM  CBC  WBC 4.0 - 10.5 K/uL 17.1  14.5  13.5   Hemoglobin 12.0 - 15.0 g/dL 16.1  09.6  04.5   Hematocrit 36.0 - 46.0 % 34.0  34.1  33.9   Platelets 150 - 400 K/uL 216  235  146         Latest Ref Rng & Units 02/18/2024   12:36 PM  01/07/2024    1:47 PM 12/21/2023    1:59 PM  CMP  Glucose 70 - 99 mg/dL 409  811  914   BUN 8 - 23 mg/dL 19  29  23    Creatinine 0.44 - 1.00 mg/dL 7.82  9.56  2.13   Sodium 135 - 145 mmol/L 140  136  135   Potassium 3.5 - 5.1 mmol/L 4.4  4.3  4.8   Chloride 98 - 111 mmol/L 104  101  101   CO2 22 - 32 mmol/L 29  28  25    Calcium 8.9 - 10.3 mg/dL 8.9  9.0  8.8   Total Protein 6.5 - 8.1 g/dL   6.8   Total Bilirubin <1.2 mg/dL   0.8   Alkaline Phos 38 - 126 U/L   81   AST 15 - 41 U/L   21   ALT 0 - 44 U/L   14       RADIOGRAPHIC STUDIES: I have personally reviewed the radiological images as listed and agreed with the findings in the report. No results found.    Orders Placed This Encounter  Procedures   CBC with Differential (Cancer Center Only)    Standing Status:   Future    Expected Date:   05/12/2024    Expiration Date:   05/12/2025   Basic Metabolic Panel - Cancer Center Only    Standing Status:   Future    Expected Date:   05/12/2024    Expiration Date:   05/12/2025   All questions were answered. The patient knows to call the clinic with any problems, questions or concerns. No barriers to learning was detected. The total time spent in the appointment was 25 minutes.     Malachy Mood, MD 02/18/2024

## 2024-02-18 NOTE — Patient Instructions (Signed)
 [facility] - A DEPT OF [facility]  Discharge Instructions: Thank you for choosing [facility] to provide your oncology and hematology care.   If you have a lab appointment with the Cancer Center, please go directly to the Cancer Center and check in at the registration area.   Wear comfortable clothing and clothing appropriate for easy access to any Portacath or PICC line.   We strive to give you quality time with your provider. You may need to reschedule your appointment if you arrive late (15 or more minutes).  Arriving late affects you and other patients whose appointments are after yours.  Also, if you miss three or more appointments without notifying the office, you may be dismissed from the clinic at the provider's discretion.      For prescription refill requests, have your pharmacy contact our office and allow 72 hours for refills to be completed.    Today you received the following chemotherapy and/or immunotherapy agents: Vidaza      To help prevent nausea and vomiting after your treatment, we encourage you to take your nausea medication as directed.  BELOW ARE SYMPTOMS THAT SHOULD BE REPORTED IMMEDIATELY: *FEVER GREATER THAN 100.4 F (38 C) OR HIGHER *CHILLS OR SWEATING *NAUSEA AND VOMITING THAT IS NOT CONTROLLED WITH YOUR NAUSEA MEDICATION *UNUSUAL SHORTNESS OF BREATH *UNUSUAL BRUISING OR BLEEDING *URINARY PROBLEMS (pain or burning when urinating, or frequent urination) *BOWEL PROBLEMS (unusual diarrhea, constipation, pain near the anus) TENDERNESS IN MOUTH AND THROAT WITH OR WITHOUT PRESENCE OF ULCERS (sore throat, sores in mouth, or a toothache) UNUSUAL RASH, SWELLING OR PAIN  UNUSUAL VAGINAL DISCHARGE OR ITCHING   Items with * indicate a potential emergency and should be followed up as soon as possible or go to the Emergency Department if any problems should occur.  Please show the CHEMOTHERAPY ALERT CARD or IMMUNOTHERAPY  ALERT CARD at check-in to the Emergency Department and triage nurse.  Should you have questions after your visit or need to cancel or reschedule your appointment, please contact [facility] - A DEPT OF [facility]  Dept: [phone]  and follow the prompts.  Office hours are 8:00 a.m. to 4:30 p.m. Monday - Friday. Please note that voicemails left after 4:00 p.m. may not be returned until the following business day.  We are closed weekends and major holidays. You have access to a nurse at all times for urgent questions. Please call the main number to the clinic Dept: [phone] and follow the prompts.   For any non-urgent questions, you may also contact your provider using MyChart. We now offer e-Visits for anyone 5 and older to request care online for non-urgent symptoms. For details visit [email or web address].   Also download the MyChart app! Go to the app store, search "MyChart", open the app, select [facility], and log in with your MyChart username and password.

## 2024-02-19 ENCOUNTER — Inpatient Hospital Stay: Payer: Medicare Other

## 2024-02-19 VITALS — BP 123/48 | HR 68 | Temp 98.2°F | Resp 16

## 2024-02-19 DIAGNOSIS — Z79899 Other long term (current) drug therapy: Secondary | ICD-10-CM | POA: Diagnosis not present

## 2024-02-19 DIAGNOSIS — C9310 Chronic myelomonocytic leukemia not having achieved remission: Secondary | ICD-10-CM

## 2024-02-19 DIAGNOSIS — Z5111 Encounter for antineoplastic chemotherapy: Secondary | ICD-10-CM | POA: Diagnosis not present

## 2024-02-19 MED ORDER — SODIUM CHLORIDE 0.9% FLUSH
10.0000 mL | INTRAVENOUS | Status: DC | PRN
  Administered 2024-02-19: 10 mL

## 2024-02-19 MED ORDER — SODIUM CHLORIDE 0.9 % IV SOLN
75.0000 mg/m2 | Freq: Once | INTRAVENOUS | Status: AC
  Administered 2024-02-19: 114 mg via INTRAVENOUS
  Filled 2024-02-19: qty 11.4

## 2024-02-19 MED ORDER — SODIUM CHLORIDE 0.9 % IV SOLN
Freq: Once | INTRAVENOUS | Status: AC
Start: 2024-02-19 — End: 2024-02-19

## 2024-02-19 MED ORDER — ONDANSETRON HCL 4 MG/2ML IJ SOLN
8.0000 mg | Freq: Once | INTRAMUSCULAR | Status: AC
  Administered 2024-02-19: 8 mg via INTRAVENOUS
  Filled 2024-02-19: qty 4

## 2024-02-19 MED ORDER — HEPARIN SOD (PORK) LOCK FLUSH 100 UNIT/ML IV SOLN
500.0000 [IU] | Freq: Once | INTRAVENOUS | Status: AC | PRN
  Administered 2024-02-19: 500 [IU]

## 2024-02-19 NOTE — Patient Instructions (Signed)
 [facility]  Discharge Instructions: Thank you for choosing [facility] to provide your oncology and hematology care.   If you have a lab appointment with the Cancer Center, please go directly to the Cancer Center and check in at the registration area.   Wear comfortable clothing and clothing appropriate for easy access to any Portacath or PICC line.   We strive to give you quality time with your provider. You may need to reschedule your appointment if you arrive late (15 or more minutes).  Arriving late affects you and other patients whose appointments are after yours.  Also, if you miss three or more appointments without notifying the office, you may be dismissed from the clinic at the provider's discretion.      For prescription refill requests, have your pharmacy contact our office and allow 72 hours for refills to be completed.    Today you received the following chemotherapy and/or immunotherapy agent: Azacitidine (Vidaza)      To help prevent nausea and vomiting after your treatment, we encourage you to take your nausea medication as directed.  BELOW ARE SYMPTOMS THAT SHOULD BE REPORTED IMMEDIATELY: *FEVER GREATER THAN 100.4 F (38 C) OR HIGHER *CHILLS OR SWEATING *NAUSEA AND VOMITING THAT IS NOT CONTROLLED WITH YOUR NAUSEA MEDICATION *UNUSUAL SHORTNESS OF BREATH *UNUSUAL BRUISING OR BLEEDING *URINARY PROBLEMS (pain or burning when urinating, or frequent urination) *BOWEL PROBLEMS (unusual diarrhea, constipation, pain near the anus) TENDERNESS IN MOUTH AND THROAT WITH OR WITHOUT PRESENCE OF ULCERS (sore throat, sores in mouth, or a toothache) UNUSUAL RASH, SWELLING OR PAIN  UNUSUAL VAGINAL DISCHARGE OR ITCHING   Items with * indicate a potential emergency and should be followed up as soon as possible or go to the Emergency Department if any problems should occur.  Please show the CHEMOTHERAPY ALERT CARD or  IMMUNOTHERAPY ALERT CARD at check-in to the Emergency Department and triage nurse.  Should you have questions after your visit or need to cancel or reschedule your appointment, please contact [facility] - A DEPT OF [facility]  Dept: [phone]  and follow the prompts.  Office hours are 8:00 a.m. to 4:30 p.m. Monday - Friday. Please note that voicemails left after 4:00 p.m. may not be returned until the following business day.  We are closed weekends and major holidays. You have access to a nurse at all times for urgent questions. Please call the main number to the clinic Dept: [phone] and follow the prompts.   For any non-urgent questions, you may also contact your provider using MyChart. We now offer e-Visits for anyone 57 and older to request care online for non-urgent symptoms. For details visit [email or web address].   Also download the MyChart app! Go to the app store, search "MyChart", open the app, select [place], and log in with your MyChart username and password.  Azacitidine Injection What is this medication? AZACITIDINE (ay za SITE i deen) treats blood and bone marrow cancers. It works by slowing down the growth of cancer cells. This medicine may be used for other purposes; ask your health care provider or pharmacist if you have questions. COMMON BRAND NAME(S): Vidaza What should I tell my care team before I take this medication? They need to know if you have any of these conditions: Kidney disease Liver disease Low blood cell levels, such as low white cells, platelets, or red blood  cells Low levels of albumin in the blood Low levels of bicarbonate in the blood An unusual or allergic reaction to azacitidine, mannitol, other medications, foods, dyes, or preservatives If you or your partner are pregnant or trying to get pregnant Breast-feeding How should I use this medication? This medication is injected into a vein or under the skin.  It is given by your care team in a hospital or clinic setting. Talk to your care team about the use of this medication in children. While it may be prescribed for children as young as 1 month for selected conditions, precautions do apply. Overdosage: If you think you have taken too much of this medicine contact a poison control center or emergency room at once. NOTE: This medicine is only for you. Do not share this medicine with others. What if I miss a dose? Keep appointments for follow-up doses. It is important not to miss your dose. Call your care team if you are unable to keep an appointment. What may interact with this medication? Interactions are not expected. This list may not describe all possible interactions. Give your health care provider a list of all the medicines, herbs, non-prescription drugs, or dietary supplements you use. Also tell them if you smoke, drink alcohol, or use illegal drugs. Some items may interact with your medicine. What should I watch for while using this medication? Your condition will be monitored carefully while you are receiving this medication. This medication may make you feel generally unwell. This is not uncommon as chemotherapy can affect healthy cells as well as cancer cells. Report any side effects. Continue your course of treatment even though you feel ill unless your care team tells you to stop. You may need blood work done while you are taking this medication. Other product types may be available that contain the medication azacitidine. The injection and oral products should not be used in place of one another. Talk to your care team if you have questions. This medication can cause serious side effects. To reduce the risk, your care team may give you other medications to take before receiving this one. Be sure to follow the directions from your care team. This medication may increase your risk of getting an infection. Call your care team for advice if you  get a fever, chills, sore throat, or other symptoms of a cold or flu. Do not treat yourself. Try to avoid being around people who are sick. Avoid taking medications that contain aspirin, acetaminophen, ibuprofen, naproxen, or ketoprofen unless instructed by your care team. These medications may hide a fever. Be careful brushing or flossing your teeth or using a toothpick because you may get an infection or bleed more easily. If you have any dental work done, tell your dentist you are receiving this medication. Talk to your care team if you or your partner may be pregnant. Serious birth defects can occur if you take this medication during pregnancy and for 6 months after the last dose. You will need a negative pregnancy test before starting this medication. Contraception is recommended while taking this medication and for 6 months after the last dose. Your care team can help you find the option that works for you. If your partner can get pregnant, use a condom during sex while taking this medication and for 3 months after the last dose. Do not breastfeed while taking this medication and for 1 week after the last dose. This medication may cause infertility. Talk to your care team if  you are concerned about your fertility. What side effects may I notice from receiving this medication? Side effects that you should report to your care team as soon as possible: Allergic reactions--skin rash, itching, hives, swelling of the face, lips, tongue, or throat Infection--fever, chills, cough, sore throat, wounds that don't heal, pain or trouble when passing urine, general feeling of discomfort or being unwell Kidney injury--decrease in the amount of urine, swelling of the ankles, hands, or feet Liver injury--right upper belly pain, loss of appetite, nausea, light-colored stool, dark yellow or brown urine, yellowing skin or eyes, unusual weakness or fatigue Low red blood cell level--unusual weakness or fatigue,  dizziness, headache, trouble breathing Tumor lysis syndrome (TLS)--nausea, vomiting, diarrhea, decrease in the amount of urine, dark urine, unusual weakness or fatigue, confusion, muscle pain or cramps, fast or irregular heartbeat, joint pain Unusual bruising or bleeding Side effects that usually do not require medical attention (report to your care team if they continue or are bothersome): Constipation Diarrhea Nausea Pain, redness, or irritation at injection site Vomiting This list may not describe all possible side effects. Call your doctor for medical advice about side effects. You may report side effects to FDA at 1-800-FDA-1088. Where should I keep my medication? This medication is given in a hospital or clinic. It will not be stored at home. NOTE: This sheet is a summary. It may not cover all possible information. If you have questions about this medicine, talk to your doctor, pharmacist, or health care provider.  2024 [employer]vier/Gold Standard (2023-08-13 00:00:00)

## 2024-02-20 ENCOUNTER — Inpatient Hospital Stay: Payer: Medicare Other

## 2024-02-20 VITALS — BP 124/49 | HR 65 | Resp 16

## 2024-02-20 DIAGNOSIS — Z5111 Encounter for antineoplastic chemotherapy: Secondary | ICD-10-CM | POA: Diagnosis not present

## 2024-02-20 DIAGNOSIS — Z79899 Other long term (current) drug therapy: Secondary | ICD-10-CM | POA: Diagnosis not present

## 2024-02-20 DIAGNOSIS — C9310 Chronic myelomonocytic leukemia not having achieved remission: Secondary | ICD-10-CM

## 2024-02-20 MED ORDER — HEPARIN SOD (PORK) LOCK FLUSH 100 UNIT/ML IV SOLN
500.0000 [IU] | Freq: Once | INTRAVENOUS | Status: AC | PRN
  Administered 2024-02-20: 500 [IU]

## 2024-02-20 MED ORDER — SODIUM CHLORIDE 0.9 % IV SOLN
75.0000 mg/m2 | Freq: Once | INTRAVENOUS | Status: AC
  Administered 2024-02-20: 114 mg via INTRAVENOUS
  Filled 2024-02-20: qty 11.4

## 2024-02-20 MED ORDER — SODIUM CHLORIDE 0.9% FLUSH
10.0000 mL | INTRAVENOUS | Status: DC | PRN
  Administered 2024-02-20: 10 mL

## 2024-02-20 MED ORDER — ONDANSETRON HCL 4 MG/2ML IJ SOLN
8.0000 mg | Freq: Once | INTRAMUSCULAR | Status: AC
  Administered 2024-02-20: 8 mg via INTRAVENOUS
  Filled 2024-02-20: qty 4

## 2024-02-20 MED ORDER — SODIUM CHLORIDE 0.9 % IV SOLN: Freq: Once | INTRAVENOUS | Status: AC

## 2024-02-20 NOTE — Patient Instructions (Signed)
 [facility] - A DEPT OF [facility]  Discharge Instructions: Thank you for choosing [facility] to provide your oncology and hematology care.   If you have a lab appointment with the Cancer Center, please go directly to the Cancer Center and check in at the registration area.   Wear comfortable clothing and clothing appropriate for easy access to any Portacath or PICC line.   We strive to give you quality time with your provider. You may need to reschedule your appointment if you arrive late (15 or more minutes).  Arriving late affects you and other patients whose appointments are after yours.  Also, if you miss three or more appointments without notifying the office, you may be dismissed from the clinic at the provider's discretion.      For prescription refill requests, have your pharmacy contact our office and allow 72 hours for refills to be completed.    Today you received the following chemotherapy and/or immunotherapy agents: Vidaza      To help prevent nausea and vomiting after your treatment, we encourage you to take your nausea medication as directed.  BELOW ARE SYMPTOMS THAT SHOULD BE REPORTED IMMEDIATELY: *FEVER GREATER THAN 100.4 F (38 C) OR HIGHER *CHILLS OR SWEATING *NAUSEA AND VOMITING THAT IS NOT CONTROLLED WITH YOUR NAUSEA MEDICATION *UNUSUAL SHORTNESS OF BREATH *UNUSUAL BRUISING OR BLEEDING *URINARY PROBLEMS (pain or burning when urinating, or frequent urination) *BOWEL PROBLEMS (unusual diarrhea, constipation, pain near the anus) TENDERNESS IN MOUTH AND THROAT WITH OR WITHOUT PRESENCE OF ULCERS (sore throat, sores in mouth, or a toothache) UNUSUAL RASH, SWELLING OR PAIN  UNUSUAL VAGINAL DISCHARGE OR ITCHING   Items with * indicate a potential emergency and should be followed up as soon as possible or go to the Emergency Department if any problems should occur.  Please show the CHEMOTHERAPY ALERT CARD or IMMUNOTHERAPY  ALERT CARD at check-in to the Emergency Department and triage nurse.  Should you have questions after your visit or need to cancel or reschedule your appointment, please contact [facility] - A DEPT OF [facility]  Dept: [phone]  and follow the prompts.  Office hours are 8:00 a.m. to 4:30 p.m. Monday - Friday. Please note that voicemails left after 4:00 p.m. may not be returned until the following business day.  We are closed weekends and major holidays. You have access to a nurse at all times for urgent questions. Please call the main number to the clinic Dept: [phone] and follow the prompts.   For any non-urgent questions, you may also contact your provider using MyChart. We now offer e-Visits for anyone 5 and older to request care online for non-urgent symptoms. For details visit [email or web address].   Also download the MyChart app! Go to the app store, search "MyChart", open the app, select [facility], and log in with your MyChart username and password.

## 2024-02-21 ENCOUNTER — Inpatient Hospital Stay: Payer: Medicare Other

## 2024-02-21 VITALS — BP 135/57 | HR 60 | Temp 98.2°F | Resp 13

## 2024-02-21 DIAGNOSIS — C9310 Chronic myelomonocytic leukemia not having achieved remission: Secondary | ICD-10-CM | POA: Diagnosis not present

## 2024-02-21 DIAGNOSIS — Z5111 Encounter for antineoplastic chemotherapy: Secondary | ICD-10-CM | POA: Diagnosis not present

## 2024-02-21 DIAGNOSIS — Z79899 Other long term (current) drug therapy: Secondary | ICD-10-CM | POA: Diagnosis not present

## 2024-02-21 MED ORDER — SODIUM CHLORIDE 0.9 % IV SOLN
Freq: Once | INTRAVENOUS | Status: AC
Start: 2024-02-21 — End: 2024-02-21

## 2024-02-21 MED ORDER — SODIUM CHLORIDE 0.9 % IV SOLN
75.0000 mg/m2 | Freq: Once | INTRAVENOUS | Status: AC
  Administered 2024-02-21: 114 mg via INTRAVENOUS
  Filled 2024-02-21: qty 11.4

## 2024-02-21 MED ORDER — HEPARIN SOD (PORK) LOCK FLUSH 100 UNIT/ML IV SOLN
500.0000 [IU] | Freq: Once | INTRAVENOUS | Status: AC | PRN
  Administered 2024-02-21: 500 [IU]

## 2024-02-21 MED ORDER — SODIUM CHLORIDE 0.9% FLUSH
10.0000 mL | INTRAVENOUS | Status: DC | PRN
Start: 2024-02-21 — End: 2024-02-21
  Administered 2024-02-21: 10 mL

## 2024-02-21 MED ORDER — ONDANSETRON HCL 4 MG/2ML IJ SOLN
8.0000 mg | Freq: Once | INTRAMUSCULAR | Status: AC
  Administered 2024-02-21: 8 mg via INTRAVENOUS
  Filled 2024-02-21: qty 4

## 2024-02-21 NOTE — Patient Instructions (Signed)
 [facility]  Discharge Instructions: Thank you for choosing [facility] to provide your oncology and hematology care.   If you have a lab appointment with the Cancer Center, please go directly to the Cancer Center and check in at the registration area.   Wear comfortable clothing and clothing appropriate for easy access to any Portacath or PICC line.   We strive to give you quality time with your provider. You may need to reschedule your appointment if you arrive late (15 or more minutes).  Arriving late affects you and other patients whose appointments are after yours.  Also, if you miss three or more appointments without notifying the office, you may be dismissed from the clinic at the provider's discretion.      For prescription refill requests, have your pharmacy contact our office and allow 72 hours for refills to be completed.    Today you received the following chemotherapy and/or immunotherapy agent: Azacitidine (Vidaza)      To help prevent nausea and vomiting after your treatment, we encourage you to take your nausea medication as directed.  BELOW ARE SYMPTOMS THAT SHOULD BE REPORTED IMMEDIATELY: *FEVER GREATER THAN 100.4 F (38 C) OR HIGHER *CHILLS OR SWEATING *NAUSEA AND VOMITING THAT IS NOT CONTROLLED WITH YOUR NAUSEA MEDICATION *UNUSUAL SHORTNESS OF BREATH *UNUSUAL BRUISING OR BLEEDING *URINARY PROBLEMS (pain or burning when urinating, or frequent urination) *BOWEL PROBLEMS (unusual diarrhea, constipation, pain near the anus) TENDERNESS IN MOUTH AND THROAT WITH OR WITHOUT PRESENCE OF ULCERS (sore throat, sores in mouth, or a toothache) UNUSUAL RASH, SWELLING OR PAIN  UNUSUAL VAGINAL DISCHARGE OR ITCHING   Items with * indicate a potential emergency and should be followed up as soon as possible or go to the Emergency Department if any problems should occur.  Please show the CHEMOTHERAPY ALERT CARD or  IMMUNOTHERAPY ALERT CARD at check-in to the Emergency Department and triage nurse.  Should you have questions after your visit or need to cancel or reschedule your appointment, please contact [facility] - A DEPT OF [facility]  Dept: [phone]  and follow the prompts.  Office hours are 8:00 a.m. to 4:30 p.m. Monday - Friday. Please note that voicemails left after 4:00 p.m. may not be returned until the following business day.  We are closed weekends and major holidays. You have access to a nurse at all times for urgent questions. Please call the main number to the clinic Dept: [phone] and follow the prompts.   For any non-urgent questions, you may also contact your provider using MyChart. We now offer e-Visits for anyone 57 and older to request care online for non-urgent symptoms. For details visit [email or web address].   Also download the MyChart app! Go to the app store, search "MyChart", open the app, select [place], and log in with your MyChart username and password.  Azacitidine Injection What is this medication? AZACITIDINE (ay za SITE i deen) treats blood and bone marrow cancers. It works by slowing down the growth of cancer cells. This medicine may be used for other purposes; ask your health care provider or pharmacist if you have questions. COMMON BRAND NAME(S): Vidaza What should I tell my care team before I take this medication? They need to know if you have any of these conditions: Kidney disease Liver disease Low blood cell levels, such as low white cells, platelets, or red blood  cells Low levels of albumin in the blood Low levels of bicarbonate in the blood An unusual or allergic reaction to azacitidine, mannitol, other medications, foods, dyes, or preservatives If you or your partner are pregnant or trying to get pregnant Breast-feeding How should I use this medication? This medication is injected into a vein or under the skin.  It is given by your care team in a hospital or clinic setting. Talk to your care team about the use of this medication in children. While it may be prescribed for children as young as 1 month for selected conditions, precautions do apply. Overdosage: If you think you have taken too much of this medicine contact a poison control center or emergency room at once. NOTE: This medicine is only for you. Do not share this medicine with others. What if I miss a dose? Keep appointments for follow-up doses. It is important not to miss your dose. Call your care team if you are unable to keep an appointment. What may interact with this medication? Interactions are not expected. This list may not describe all possible interactions. Give your health care provider a list of all the medicines, herbs, non-prescription drugs, or dietary supplements you use. Also tell them if you smoke, drink alcohol, or use illegal drugs. Some items may interact with your medicine. What should I watch for while using this medication? Your condition will be monitored carefully while you are receiving this medication. This medication may make you feel generally unwell. This is not uncommon as chemotherapy can affect healthy cells as well as cancer cells. Report any side effects. Continue your course of treatment even though you feel ill unless your care team tells you to stop. You may need blood work done while you are taking this medication. Other product types may be available that contain the medication azacitidine. The injection and oral products should not be used in place of one another. Talk to your care team if you have questions. This medication can cause serious side effects. To reduce the risk, your care team may give you other medications to take before receiving this one. Be sure to follow the directions from your care team. This medication may increase your risk of getting an infection. Call your care team for advice if you  get a fever, chills, sore throat, or other symptoms of a cold or flu. Do not treat yourself. Try to avoid being around people who are sick. Avoid taking medications that contain aspirin, acetaminophen, ibuprofen, naproxen, or ketoprofen unless instructed by your care team. These medications may hide a fever. Be careful brushing or flossing your teeth or using a toothpick because you may get an infection or bleed more easily. If you have any dental work done, tell your dentist you are receiving this medication. Talk to your care team if you or your partner may be pregnant. Serious birth defects can occur if you take this medication during pregnancy and for 6 months after the last dose. You will need a negative pregnancy test before starting this medication. Contraception is recommended while taking this medication and for 6 months after the last dose. Your care team can help you find the option that works for you. If your partner can get pregnant, use a condom during sex while taking this medication and for 3 months after the last dose. Do not breastfeed while taking this medication and for 1 week after the last dose. This medication may cause infertility. Talk to your care team if  you are concerned about your fertility. What side effects may I notice from receiving this medication? Side effects that you should report to your care team as soon as possible: Allergic reactions--skin rash, itching, hives, swelling of the face, lips, tongue, or throat Infection--fever, chills, cough, sore throat, wounds that don't heal, pain or trouble when passing urine, general feeling of discomfort or being unwell Kidney injury--decrease in the amount of urine, swelling of the ankles, hands, or feet Liver injury--right upper belly pain, loss of appetite, nausea, light-colored stool, dark yellow or brown urine, yellowing skin or eyes, unusual weakness or fatigue Low red blood cell level--unusual weakness or fatigue,  dizziness, headache, trouble breathing Tumor lysis syndrome (TLS)--nausea, vomiting, diarrhea, decrease in the amount of urine, dark urine, unusual weakness or fatigue, confusion, muscle pain or cramps, fast or irregular heartbeat, joint pain Unusual bruising or bleeding Side effects that usually do not require medical attention (report to your care team if they continue or are bothersome): Constipation Diarrhea Nausea Pain, redness, or irritation at injection site Vomiting This list may not describe all possible side effects. Call your doctor for medical advice about side effects. You may report side effects to FDA at 1-800-FDA-1088. Where should I keep my medication? This medication is given in a hospital or clinic. It will not be stored at home. NOTE: This sheet is a summary. It may not cover all possible information. If you have questions about this medicine, talk to your doctor, pharmacist, or health care provider.  2024 [employer]vier/Gold Standard (2023-08-13 00:00:00)

## 2024-02-22 ENCOUNTER — Inpatient Hospital Stay: Payer: Medicare Other

## 2024-02-22 VITALS — BP 122/57 | HR 89 | Temp 98.1°F | Resp 16

## 2024-02-22 DIAGNOSIS — Z5111 Encounter for antineoplastic chemotherapy: Secondary | ICD-10-CM | POA: Diagnosis not present

## 2024-02-22 DIAGNOSIS — C9310 Chronic myelomonocytic leukemia not having achieved remission: Secondary | ICD-10-CM | POA: Diagnosis not present

## 2024-02-22 DIAGNOSIS — Z79899 Other long term (current) drug therapy: Secondary | ICD-10-CM | POA: Diagnosis not present

## 2024-02-22 MED ORDER — SODIUM CHLORIDE 0.9 % IV SOLN: Freq: Once | INTRAVENOUS | Status: AC

## 2024-02-22 MED ORDER — SODIUM CHLORIDE 0.9% FLUSH
10.0000 mL | INTRAVENOUS | Status: DC | PRN
  Administered 2024-02-22: 10 mL

## 2024-02-22 MED ORDER — HEPARIN SOD (PORK) LOCK FLUSH 100 UNIT/ML IV SOLN
500.0000 [IU] | Freq: Once | INTRAVENOUS | Status: AC | PRN
  Administered 2024-02-22: 500 [IU]

## 2024-02-22 MED ORDER — SODIUM CHLORIDE 0.9 % IV SOLN
75.0000 mg/m2 | Freq: Once | INTRAVENOUS | Status: AC
  Administered 2024-02-22: 114 mg via INTRAVENOUS
  Filled 2024-02-22: qty 11.4

## 2024-02-22 MED ORDER — ONDANSETRON HCL 4 MG/2ML IJ SOLN
8.0000 mg | Freq: Once | INTRAMUSCULAR | Status: AC
  Administered 2024-02-22: 8 mg via INTRAVENOUS
  Filled 2024-02-22: qty 4

## 2024-02-22 NOTE — Patient Instructions (Signed)
 [facility]   Discharge Instructions: Thank you for choosing [facility] to provide your oncology and hematology care.   If you have a lab appointment with the Cancer Center, please go directly to the Cancer Center and check in at the registration area.   Wear comfortable clothing and clothing appropriate for easy access to any Portacath or PICC line.   We strive to give you quality time with your provider. You may need to reschedule your appointment if you arrive late (15 or more minutes).  Arriving late affects you and other patients whose appointments are after yours.  Also, if you miss three or more appointments without notifying the office, you may be dismissed from the clinic at the provider's discretion.      For prescription refill requests, have your pharmacy contact our office and allow 72 hours for refills to be completed.    Today you received the following chemotherapy and/or immunotherapy agents: Azacitidine (Vidaza)      To help prevent nausea and vomiting after your treatment, we encourage you to take your nausea medication as directed.  BELOW ARE SYMPTOMS THAT SHOULD BE REPORTED IMMEDIATELY: *FEVER GREATER THAN 100.4 F (38 C) OR HIGHER *CHILLS OR SWEATING *NAUSEA AND VOMITING THAT IS NOT CONTROLLED WITH YOUR NAUSEA MEDICATION *UNUSUAL SHORTNESS OF BREATH *UNUSUAL BRUISING OR BLEEDING *URINARY PROBLEMS (pain or burning when urinating, or frequent urination) *BOWEL PROBLEMS (unusual diarrhea, constipation, pain near the anus) TENDERNESS IN MOUTH AND THROAT WITH OR WITHOUT PRESENCE OF ULCERS (sore throat, sores in mouth, or a toothache) UNUSUAL RASH, SWELLING OR PAIN  UNUSUAL VAGINAL DISCHARGE OR ITCHING   Items with * indicate a potential emergency and should be followed up as soon as possible or go to the Emergency Department if any problems should occur.  Please show the CHEMOTHERAPY ALERT CARD or  IMMUNOTHERAPY ALERT CARD at check-in to the Emergency Department and triage nurse.  Should you have questions after your visit or need to cancel or reschedule your appointment, please contact [facility] - A DEPT OF [facility]  Dept: [phone]  and follow the prompts.  Office hours are 8:00 a.m. to 4:30 p.m. Monday - Friday. Please note that voicemails left after 4:00 p.m. may not be returned until the following business day.  We are closed weekends and major holidays. You have access to a nurse at all times for urgent questions. Please call the main number to the clinic Dept: [phone] and follow the prompts.   For any non-urgent questions, you may also contact your provider using MyChart. We now offer e-Visits for anyone 1 and older to request care online for non-urgent symptoms. For details visit [email or web address].   Also download the MyChart app! Go to the app store, search "MyChart", open the app, select [place], and log in with your MyChart username and password.

## 2024-02-28 DIAGNOSIS — M0579 Rheumatoid arthritis with rheumatoid factor of multiple sites without organ or systems involvement: Secondary | ICD-10-CM | POA: Diagnosis not present

## 2024-02-28 DIAGNOSIS — Z79899 Other long term (current) drug therapy: Secondary | ICD-10-CM | POA: Diagnosis not present

## 2024-03-26 DIAGNOSIS — M0579 Rheumatoid arthritis with rheumatoid factor of multiple sites without organ or systems involvement: Secondary | ICD-10-CM | POA: Diagnosis not present

## 2024-03-26 DIAGNOSIS — Z79899 Other long term (current) drug therapy: Secondary | ICD-10-CM | POA: Diagnosis not present

## 2024-03-26 DIAGNOSIS — E790 Hyperuricemia without signs of inflammatory arthritis and tophaceous disease: Secondary | ICD-10-CM | POA: Diagnosis not present

## 2024-03-26 DIAGNOSIS — Z681 Body mass index (BMI) 19 or less, adult: Secondary | ICD-10-CM | POA: Diagnosis not present

## 2024-03-26 DIAGNOSIS — M1991 Primary osteoarthritis, unspecified site: Secondary | ICD-10-CM | POA: Diagnosis not present

## 2024-03-30 NOTE — Assessment & Plan Note (Signed)
 diagnosed in 10/2021 ---She developed worsening back pain, pelvic MRI 07/04/21 showed a mass in the presacral space. 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  -She began azacitadine on 02/06/22. she has had multiple hospital admissions and her performance status has been very low.  Changed her chemo to every 6 weeks in 08/2022 due to her fatigue. -she is tolerating every 6 weeks much better -she saw Dr. Sharyne Richters in Feb 2024, who recommends to continue current therapy  -Due to worsening low back pain, she completed palliative radiation on 07/02/2023 -due to worsening low back pain, she was hospitalized on November 12, 2023.  Currently follow-up with palliative care for pain control. -On Vidaza on days 1 through 5, every 6 weeks.  -03/31/2024 is cycle 15 day 1

## 2024-03-30 NOTE — Progress Notes (Unsigned)
 Patient Care Team: [name], MD as PCP - General (Internal Medicine) [name], Deloris Ping, MD as PCP - Cardiology (Cardiology) Zenovia Jordan, MD as Consulting Physician (Rheumatology) Malachy Mood, MD as Consulting Physician (Hematology) Charna Elizabeth, MD as Consulting Physician (Gastroenterology) Josephine Igo, DO as Consulting Physician (Pulmonary Disease) Pickenpack-Cousar, Arty Baumgartner, NP as Nurse Practitioner [facility] and Palliative Medicine)  Clinic Day:  03/31/2024  Referring physician: Malachy Mood, MD  ASSESSMENT & PLAN:   Assessment & Plan: CMML (chronic myelomonocytic leukemia) (HCC) diagnosed in 10/2021 ---She developed worsening back pain, pelvic MRI 07/04/21 showed a mass in the presacral space. 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 Her BM biopsy was reviewed at [facility] and was felt to be CMML-1 with 5% blasts.  -She began azacitadine on 02/06/22. she has had multiple hospital admissions and her performance status has been very low.  Changed her chemo to every 6 weeks in 08/2022 due to her fatigue. -she is tolerating every 6 weeks much better -she saw Dr. Sharyne Richters in Feb 2024, who recommends to continue current therapy  -Due to worsening low back pain, she completed palliative radiation on 07/02/2023 -due to worsening low back pain, she was hospitalized on November 12, 2023.  Currently follow-up with palliative care for pain control. -On Vidaza on days 1 through 5, every 6 weeks.  -03/31/2024 is cycle 15 day 1 Vidaza.   Swelling and discoloration right lower extremity Majority of right lower extremity has a bruised appearance with 1+ pitting edema.  There is tenderness across the right calf with palpation.  There is palpable lump to posterior right lower leg.  ROM and strength of right lower leg is intact.   She is able to walk with help of her walker.  This is baseline.  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  Plan to get stat Doppler for right lower extremity to evaluate for DVT.  Patient is on Eliquis for chronic A-fib.   Plan Labs reviewed. -Stable leukocytosis.  Stable and mild anemia.  Electrolytes and kidney functions within normal limits.  Okay to proceed with Vidaza. -Today of cycle 15 day 1 Vidaza.  Treatment is scheduled for days 1 through 5 of every 6 weeks. -Continue labs, flush, follow-ups, and treatments as currently scheduled.  The patient understands the plans discussed today and is in agreement with them.  She knows to contact our office if she develops concerns prior to her next appointment.  I provided 25 minutes of face-to-face time during this encounter and > 50% was spent counseling as documented under my assessment and plan.    Carlean Jews, NP  [facility] - A DEPT OF [facility] [address] [place] [address] Dept: [phone] Dept Fax: [phone]   No orders of the defined types were placed in this encounter.     CHIEF COMPLAINT:  CC: CMML  Current Treatment:  Vidaza on days 1 through 5, every 6 weeks  INTERVAL HISTORY:  [name] is here today for repeat clinical assessment. Last seen by Dr. Mosetta Putt on 02/18/2024.  She states that today she woke up with unusual bruising to her right lower extremity.  Bruising is present from just below the knees to just above the ankle.  She also has noticed some mild swelling.  There is some tenderness to  the right lower extremity.  Started to happen to left lower extremity as well.  Does not remember when her leg or any trauma to the leg.  Strength of the lower extremity and range of motion is intact.  She denies chest pain, chest pressure, or shortness of breath. She denies headaches or visual disturbances. She denies abdominal pain, nausea,  vomiting, or changes in bowel or bladder habits.  She denies fevers or chills. She denies pain. Her appetite is good. Her weight has increased 3 pounds over last 6 weeks .  I have reviewed the past medical history, past surgical history, social history and family history with the patient and they are unchanged from previous note.  ALLERGIES:  is allergic to digoxin and related, gluten meal, penicillins, fexofenadine, alendronate, and hydroxychloroquine.  MEDICATIONS:  Current Outpatient Medications  Medication Sig Dispense Refill   acetaminophen (TYLENOL) 500 MG tablet Take 2 tablets (1,000 mg total) by mouth 3 (three) times daily. 30 tablet 0   apixaban (ELIQUIS) 5 MG TABS tablet Take 1 tablet (5 mg total) by mouth 2 (two) times daily. 60 tablet 11   denosumab (PROLIA) 60 MG/ML SOSY injection Inject 60 mg into the skin every 6 (six) months.     diltiazem (CARDIZEM CD) 360 MG 24 hr capsule Take 1 capsule (360 mg total) by mouth daily. 90 capsule 3   gabapentin (NEURONTIN) 300 MG capsule Take 300 mg by mouth in the morning.     Metoprolol Tartrate 75 MG TABS Take 2 tablets (150 mg total) by mouth 2 (two) times daily. 360 tablet 2   predniSONE (DELTASONE) 5 MG tablet Take 5 mg by mouth daily with breakfast.     Tiotropium Bromide-Olodaterol (STIOLTO RESPIMAT) 2.5-2.5 MCG/ACT AERS Inhale 2 puffs into the lungs daily. 4 g 11   furosemide (LASIX) 20 MG tablet Take 1 tablet (20 mg total) by mouth daily. 30 tablet 11   No current facility-administered medications for this visit.   Facility-Administered Medications Ordered in Other Visits  Medication Dose Route Frequency Provider Last Rate Last Admin   acetaminophen (TYLENOL) 325 MG tablet            azaCITIDine (VIDAZA) 114 mg in sodium chloride 0.9 % 50 mL chemo infusion  75 mg/m2 (Treatment Plan Recorded) Intravenous Once Malachy Mood, MD       diphenhydrAMINE (BENADRYL) 25 mg capsule            heparin lock flush 100 unit/mL  500 Units  Intracatheter Once PRN Malachy Mood, MD       sodium chloride flush (NS) 0.9 % injection 10 mL  10 mL Intracatheter PRN Malachy Mood, MD        HISTORY OF PRESENT ILLNESS:   Oncology History Overview Note   Cancer Staging  CMML (chronic myelomonocytic leukemia) (HCC) Staging form: Chronic Myeloid Leukemia, AJCC 8th Edition - Clinical stage from 01/12/2022: Bone marrow blast count (%): 12, Additional clonal changes: Unknown - Signed by Malachy Mood, MD on 01/12/2022 Stage prefix: Initial diagnosis     CMML (chronic myelomonocytic leukemia) (HCC)  11/09/2021 Initial Biopsy   DIAGNOSIS:   -  Monoclonal B-cell population with co-expression of CD5 comprises 17%  of all lymphocytes  -  See comment   COMMENT:  In addition to the clonal B-cell population, there is a myeloblast  population (CD34, CD38, HLA-DR, CD117, CD123 and CD33) that comprises 2% of the total cellular events.  Please see concurrent tissue biopsy (below) for additional work-up and  final diagnosis.    FINAL MICROSCOPIC DIAGNOSIS:   A. SOFT TISSUE MASS, PRE SACRAL, NEEDLE CORE BIOPSY:  -  Chronic lymphocytic leukemia/small lymphocytic lymphoma  -  Extra medullary hematopoiesis  -  See comment   COMMENT:  The biopsy consists of multiple soft tissue cores with lymphoid nodules and a dense hematopoietic infiltrate consistent with extra medullary hematopoiesis.  MPO and E-cadherin highlight myeloid and erythroid precursors respectively.  CD34 highlights increased vasculature and is also positive within the cytoplasm of megakaryocytes.  A few small, immature mononuclear cells appear to be positive for CD34 and CD117. TdT shows rare, scattered positive cells.  CD20 highlights aggregates of B cells which are admixed with CD3 positive T cells.  T cells are an admixture of CD4 and CD8.  The B cells are also positive for CD5, CD23 and Bcl-2.  The B cells do not show significant staining for CD10, BCL6 or cyclin D1.  CD138 highlights scattered  plasma cells which are polytypic by kappa and lambda in situ hybridization.  Flow cytometry performed on the sample (see [facility] [id number]) identified a kappa restricted CD5 positive B-cell population comprising 70% of lymphocytes.  In addition, a small myeloblast population comprised 2% of the total cellular events.   Overall, the findings are consistent with soft tissue involvement by  chronic lymphocytic leukemia/small lymphocytic lymphoma and extra medullary hematopoiesis. In reviewing the patient's CBC data (macrocytic anemia and thrombocytopenia), I would recommend a bone marrow biopsy to assess for marrow involvement by CLL/SLL.    12/23/2021 Imaging   EXAM: CT CHEST, ABDOMEN, AND PELVIS WITH CONTRAST  IMPRESSION: 1. Slight interval enlargement of a presacral soft tissue mass measuring 7.2 x 4.7 cm, previously 6.9 x 4.1 cm on prior MR of the pelvis dated 07/04/2021. By report, this represents a biopsy proven lymphoma. 2. Pleural nodule of the dependent right lower lobe overlying the posterior right tenth rib and pleural or paraspinous soft tissue mass overlying the right aspect of the T10 vertebral body, very slightly enlarged compared to prior examination of the chest dated 06/25/2020, consistent with additional sites of lymphomatous involvement given very indolent growth. These could be better assessed for metabolic activity by FDG PET/CT if desired. 3. There is mild, bibasilar predominant pulmonary fibrosis in a pattern featuring irregular peripheral interstitial opacity, septal thickening, but without clear evidence of subpleural bronchiolectasis or honeycombing, with a somewhat asymmetric distribution most conspicuously involving the right lower lobe and lingula. These findings are significantly worsened when compared to prior examination dated 06/25/2020, particularly in the right lower lobe. Given interval change, this may reflect sequelae of interval infection or aspiration,  however appearance is generally suspicious for fibrotic interstitial lung disease, and if characterized by ATS pulmonary fibrosis is in an "indeterminate for UIP" pattern, differential considerations including both UIP and NSIP. 4. Emphysema.   Aortic Atherosclerosis (ICD10-I70.0) and Emphysema (ICD10-J43.9).   01/05/2022 Pathology Results   DIAGNOSIS:   BONE MARROW, ASPIRATE, CLOT, CORE:  -Hypercellular bone marrow for age with features of  myelodysplastic/myeloproliferative neoplasm  -Minor abnormal B-cell population  -See comment   PERIPHERAL BLOOD:  -Macrocytic anemia  -Neutrophilic left shift and monocytosis  -Thrombocytopenia   COMMENT:  The bone marrow is hypercellular for age with dyspoietic changes  involving myeloid cell lines associated with monocytosis and increased number of blastic cells (12%) as primarily seen by morphology, many of which display monocytic features.  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  In this background, there are several predominantly small lymphoid aggregates mostly composed of small lymphoid cells.  By flow cytometry, a minor abnormal B-cell population expressing CD5 is seen and representing 2% of all cells.  This correlate with previously known B-cell lymphoproliferative process.  Correlation with cytogenetic and FISH studies is strongly recommended.    DIAGNOSIS:   -Increased number of monocytic cells present (25%)  -Minor abnormal B-cell population identified.  -See comment   COMMENT:  Flow cytometric analysis shows increased number of monocytic cells representing 25% of all cells but without aberrant phenotype or CD34 expression.  A significant CD34-positive blastic population is not identified.  The lymphoid population shows a minor B-cell population representing  2% of all cells and expressing B-cell antigens including CD20 associated with CD5, CD200 and possibly dim kappa expression.  The latter findings are abnormal and correlate with previously known B-cell lymphoproliferative process.  No significant T-cell phenotypic abnormalities identified.    01/12/2022 Initial Diagnosis   CMML (chronic myelomonocytic leukemia) (HCC)   01/12/2022 Cancer Staging   Staging form: Chronic Myeloid Leukemia, AJCC 8th Edition - Clinical stage from 01/12/2022: Bone marrow blast count (%): 12, Additional clonal changes: Unknown - Signed by Malachy Mood, MD on 01/12/2022 Stage prefix: Initial diagnosis   02/06/2022 - 08/11/2022 Chemotherapy   Patient is on Treatment Plan : MYELODYSPLASIA  Azacitidine IV D1-7 q28d     02/06/2022 -  Chemotherapy   Patient is on Treatment Plan : MYELODYSPLASIA  Azacitidine IV D1-7 q28d     06/03/2023 Imaging   MR lumbar spine with and without contrast IMPRESSION: 1. Long-standing presacral mass, at least doubled in size since 2016, now at least 8 cm in length. There is internal fat components and differential considerations include myelolipoma, liposarcoma, and teratoma. 2. 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 3. L2-3 left paracentral extrusion compressing the left L3 nerve root. 4. Right foraminal impingement at L3-4 to L4-5, greatest at L4-5. Right subarticular recess narrowing at L3-4 and L4-5.   07/09/2023 Imaging   CT abdomen and pelvis with contrast  IMPRESSION: No acute findings in the abdomen or pelvis. Specifically, no findings to explain the patient's history of pain. 2. Interval progression of presacral and right thoracic paraspinal soft tissue lesions compatible with mild progression of disease since 06/19/2022. Small posterior right thoracic chest wall lesion also mildly progressive. 3. Mild circumferential wall thickening distal esophagus. Esophagitis would be a consideration. 4.  Aortic  Atherosclerosis (ICD10-I70.0).       REVIEW OF SYSTEMS:   Constitutional: Denies fevers, chills or abnormal weight loss Eyes: Denies blurriness of vision Ears, nose, mouth, throat, and face: Denies mucositis or sore throat Respiratory: Denies cough, dyspnea or wheezes Cardiovascular: Denies palpitation or chest discomfort.  Does have swelling to right lower extremity into lesser degree, left lower extremity. Gastrointestinal:  Denies nausea, heartburn or change in bowel habits Skin: Denies abnormal skin rashes Lymphatics: Denies new lymphadenopathy or easy bruising Neurological:Denies numbness, tingling or new weaknesses Behavioral/Psych: Mood is stable, no new changes  All other systems were reviewed with the patient and are negative.   VITALS:   Today's Vitals   03/31/24 1341  BP: 110/66  Pulse: 76  Resp: (!) 21  Temp: 98.8 F (37.1 C)  TempSrc: Temporal  SpO2: 96%  Weight: 108 lb 3.2 oz (49.1 kg)  Height: 5\' 3"  (1.6 m)   Body mass index is 19.17 kg/m.   Wt Readings  from Last 3 Encounters:  03/31/24 108 lb 3.2 oz (49.1 kg)  02/18/24 105 lb 6.4 oz (47.8 kg)  01/11/24 101 lb (45.8 kg)    Body mass index is 19.17 kg/m.  Performance status (ECOG): 1 - Symptomatic but completely ambulatory  PHYSICAL EXAM:   GENERAL:alert, no distress and comfortable SKIN: skin color, texture, turgor are normal, no rashes or significant lesions EYES: normal, Conjunctiva are pink and non-injected, sclera clear OROPHARYNX:no exudate, no erythema and lips, buccal mucosa, and tongue normal  NECK: supple, thyroid normal size, non-tender, without nodularity LYMPH:  no palpable lymphadenopathy in the cervical, axillary or inguinal LUNGS: clear to auscultation and percussion with normal breathing effort HEART: She does have irregular heart rate and rhythm.  She also has 1+ pitting edema in bilateral lower extremities.  Right lower extremity is tender to palpate.  Varicose veins present on  both lower extremities. ABDOMEN:abdomen soft, non-tender and normal bowel sounds Musculoskeletal:no cyanosis of digits and no clubbing  NEURO: alert & oriented x 3 with fluent speech, no focal motor/sensory deficits  LABORATORY DATA:  I have reviewed the data as listed    Component Value Date/Time   NA 136 03/31/2024 1311   NA 141 01/04/2016 1001   K 4.1 03/31/2024 1311   K 4.5 01/04/2016 1001   CL 100 03/31/2024 1311   CO2 28 03/31/2024 1311   CO2 24 01/04/2016 1001   GLUCOSE 119 (H) 03/31/2024 1311   GLUCOSE 105 01/04/2016 1001   BUN 28 (H) 03/31/2024 1311   BUN 21.7 01/04/2016 1001   CREATININE 0.97 03/31/2024 1311   CREATININE 1.2 (H) 01/04/2016 1001   CALCIUM 9.5 03/31/2024 1311   CALCIUM 9.7 01/04/2016 1001   PROT 6.8 12/21/2023 1359   PROT 7.0 01/04/2016 1001   ALBUMIN 4.1 12/21/2023 1359   ALBUMIN 4.0 01/04/2016 1001   AST 21 12/21/2023 1359   AST 26 01/04/2016 1001   ALT 14 12/21/2023 1359   ALT 24 01/04/2016 1001   ALKPHOS 81 12/21/2023 1359   ALKPHOS 61 01/04/2016 1001   BILITOT 0.8 12/21/2023 1359   BILITOT 0.41 01/04/2016 1001   GFRNONAA >60 03/31/2024 1311   GFRAA 56 (L) 01/09/2020 1339    Lab Results  Component Value Date   WBC 19.9 (H) 03/31/2024   NEUTROABS 11.8 (H) 03/31/2024   HGB 10.5 (L) 03/31/2024   HCT 31.7 (L) 03/31/2024   MCV 99.4 03/31/2024   PLT 222 03/31/2024

## 2024-03-31 ENCOUNTER — Encounter: Payer: Self-pay | Admitting: Nurse Practitioner

## 2024-03-31 ENCOUNTER — Inpatient Hospital Stay: Payer: Medicare Other | Attending: Nurse Practitioner

## 2024-03-31 ENCOUNTER — Inpatient Hospital Stay: Payer: Medicare Other

## 2024-03-31 ENCOUNTER — Other Ambulatory Visit: Payer: Self-pay

## 2024-03-31 ENCOUNTER — Inpatient Hospital Stay (HOSPITAL_BASED_OUTPATIENT_CLINIC_OR_DEPARTMENT_OTHER): Payer: Medicare Other | Admitting: Nurse Practitioner

## 2024-03-31 VITALS — BP 110/66 | HR 76 | Temp 98.8°F | Resp 21 | Ht 63.0 in | Wt 108.2 lb

## 2024-03-31 DIAGNOSIS — C9310 Chronic myelomonocytic leukemia not having achieved remission: Secondary | ICD-10-CM

## 2024-03-31 DIAGNOSIS — Z5111 Encounter for antineoplastic chemotherapy: Secondary | ICD-10-CM | POA: Insufficient documentation

## 2024-03-31 DIAGNOSIS — M7989 Other specified soft tissue disorders: Secondary | ICD-10-CM

## 2024-03-31 DIAGNOSIS — Z79899 Other long term (current) drug therapy: Secondary | ICD-10-CM | POA: Insufficient documentation

## 2024-03-31 DIAGNOSIS — M79604 Pain in right leg: Secondary | ICD-10-CM | POA: Diagnosis not present

## 2024-03-31 DIAGNOSIS — Z95828 Presence of other vascular implants and grafts: Secondary | ICD-10-CM

## 2024-03-31 LAB — CBC WITH DIFFERENTIAL (CANCER CENTER ONLY)
Abs Immature Granulocytes: 1.31 10*3/uL — ABNORMAL HIGH (ref 0.00–0.07)
Basophils Absolute: 0.3 10*3/uL — ABNORMAL HIGH (ref 0.0–0.1)
Basophils Relative: 2 %
Eosinophils Absolute: 0.5 10*3/uL (ref 0.0–0.5)
Eosinophils Relative: 2 %
HCT: 31.7 % — ABNORMAL LOW (ref 36.0–46.0)
Hemoglobin: 10.5 g/dL — ABNORMAL LOW (ref 12.0–15.0)
Immature Granulocytes: 7 %
Lymphocytes Relative: 10 %
Lymphs Abs: 1.9 10*3/uL (ref 0.7–4.0)
MCH: 32.9 pg (ref 26.0–34.0)
MCHC: 33.1 g/dL (ref 30.0–36.0)
MCV: 99.4 fL (ref 80.0–100.0)
Monocytes Absolute: 4.1 10*3/uL — ABNORMAL HIGH (ref 0.1–1.0)
Monocytes Relative: 21 %
Neutro Abs: 11.8 10*3/uL — ABNORMAL HIGH (ref 1.7–7.7)
Neutrophils Relative %: 58 %
Platelet Count: 222 10*3/uL (ref 150–400)
RBC: 3.19 MIL/uL — ABNORMAL LOW (ref 3.87–5.11)
RDW: 14.9 % (ref 11.5–15.5)
WBC Count: 19.9 10*3/uL — ABNORMAL HIGH (ref 4.0–10.5)
nRBC: 0 % (ref 0.0–0.2)

## 2024-03-31 LAB — BASIC METABOLIC PANEL - CANCER CENTER ONLY
Anion gap: 8 (ref 5–15)
BUN: 28 mg/dL — ABNORMAL HIGH (ref 8–23)
CO2: 28 mmol/L (ref 22–32)
Calcium: 9.5 mg/dL (ref 8.9–10.3)
Chloride: 100 mmol/L (ref 98–111)
Creatinine: 0.97 mg/dL (ref 0.44–1.00)
GFR, Estimated: >60 mL/min (ref >60)
Glucose, Bld: 119 mg/dL — ABNORMAL HIGH (ref 70–99)
Potassium: 4.1 mmol/L (ref 3.5–5.1)
Sodium: 136 mmol/L (ref 135–145)

## 2024-03-31 MED ORDER — HEPARIN SOD (PORK) LOCK FLUSH 100 UNIT/ML IV SOLN
500.0000 [IU] | Freq: Once | INTRAVENOUS | Status: AC | PRN
  Administered 2024-03-31: 500 [IU]

## 2024-03-31 MED ORDER — ONDANSETRON HCL 4 MG/2ML IJ SOLN
8.0000 mg | Freq: Once | INTRAMUSCULAR | Status: AC
  Administered 2024-03-31: 8 mg via INTRAVENOUS
  Filled 2024-03-31: qty 4

## 2024-03-31 MED ORDER — SODIUM CHLORIDE 0.9 % IV SOLN
75.0000 mg/m2 | Freq: Once | INTRAVENOUS | Status: AC
  Administered 2024-03-31: 114 mg via INTRAVENOUS
  Filled 2024-03-31: qty 11.4

## 2024-03-31 MED ORDER — SODIUM CHLORIDE 0.9 % IV SOLN: Freq: Once | INTRAVENOUS | Status: AC

## 2024-03-31 MED ORDER — SODIUM CHLORIDE 0.9% FLUSH
10.0000 mL | Freq: Once | INTRAVENOUS | Status: AC
  Administered 2024-03-31: 10 mL

## 2024-03-31 MED ORDER — SODIUM CHLORIDE 0.9% FLUSH
10.0000 mL | INTRAVENOUS | Status: DC | PRN
  Administered 2024-03-31: 10 mL

## 2024-04-01 ENCOUNTER — Ambulatory Visit (HOSPITAL_COMMUNITY)
Admission: RE | Admit: 2024-04-01 | Discharge: 2024-04-01 | Disposition: A | Discharge status: Home / Self Care | Source: Ambulatory Visit | Attending: Nurse Practitioner | Admitting: Nurse Practitioner

## 2024-04-01 ENCOUNTER — Inpatient Hospital Stay: Payer: Medicare Other

## 2024-04-01 VITALS — BP 118/57 | HR 79 | Temp 97.7°F | Resp 18

## 2024-04-01 DIAGNOSIS — C9310 Chronic myelomonocytic leukemia not having achieved remission: Secondary | ICD-10-CM

## 2024-04-01 DIAGNOSIS — Z5111 Encounter for antineoplastic chemotherapy: Secondary | ICD-10-CM | POA: Diagnosis not present

## 2024-04-01 DIAGNOSIS — M7989 Other specified soft tissue disorders: Secondary | ICD-10-CM | POA: Diagnosis not present

## 2024-04-01 DIAGNOSIS — M79604 Pain in right leg: Secondary | ICD-10-CM | POA: Insufficient documentation

## 2024-04-01 DIAGNOSIS — Z79899 Other long term (current) drug therapy: Secondary | ICD-10-CM | POA: Diagnosis not present

## 2024-04-01 MED ORDER — SODIUM CHLORIDE 0.9 % IV SOLN: Freq: Once | INTRAVENOUS | Status: AC

## 2024-04-01 MED ORDER — SODIUM CHLORIDE 0.9% FLUSH
10.0000 mL | INTRAVENOUS | Status: DC | PRN
  Administered 2024-04-01: 10 mL

## 2024-04-01 MED ORDER — HEPARIN SOD (PORK) LOCK FLUSH 100 UNIT/ML IV SOLN
500.0000 [IU] | Freq: Once | INTRAVENOUS | Status: AC | PRN
  Administered 2024-04-01: 500 [IU]

## 2024-04-01 MED ORDER — SODIUM CHLORIDE 0.9 % IV SOLN
75.0000 mg/m2 | Freq: Once | INTRAVENOUS | Status: AC
  Administered 2024-04-01: 114 mg via INTRAVENOUS
  Filled 2024-04-01: qty 11.4

## 2024-04-01 MED ORDER — ONDANSETRON HCL 4 MG/2ML IJ SOLN
8.0000 mg | Freq: Once | INTRAMUSCULAR | Status: AC
  Administered 2024-04-01: 8 mg via INTRAVENOUS
  Filled 2024-04-01: qty 4

## 2024-04-01 NOTE — Patient Instructions (Signed)
 [facility] - A DEPT OF [facility]  Discharge Instructions: Thank you for choosing [facility] to provide your oncology and hematology care.   If you have a lab appointment with the Cancer Center, please go directly to the Cancer Center and check in at the registration area.   Wear comfortable clothing and clothing appropriate for easy access to any Portacath or PICC line.   We strive to give you quality time with your provider. You may need to reschedule your appointment if you arrive late (15 or more minutes).  Arriving late affects you and other patients whose appointments are after yours.  Also, if you miss three or more appointments without notifying the office, you may be dismissed from the clinic at the provider's discretion.      For prescription refill requests, have your pharmacy contact our office and allow 72 hours for refills to be completed.    Today you received the following chemotherapy and/or immunotherapy agents: Vidaza      To help prevent nausea and vomiting after your treatment, we encourage you to take your nausea medication as directed.  BELOW ARE SYMPTOMS THAT SHOULD BE REPORTED IMMEDIATELY: *FEVER GREATER THAN 100.4 F (38 C) OR HIGHER *CHILLS OR SWEATING *NAUSEA AND VOMITING THAT IS NOT CONTROLLED WITH YOUR NAUSEA MEDICATION *UNUSUAL SHORTNESS OF BREATH *UNUSUAL BRUISING OR BLEEDING *URINARY PROBLEMS (pain or burning when urinating, or frequent urination) *BOWEL PROBLEMS (unusual diarrhea, constipation, pain near the anus) TENDERNESS IN MOUTH AND THROAT WITH OR WITHOUT PRESENCE OF ULCERS (sore throat, sores in mouth, or a toothache) UNUSUAL RASH, SWELLING OR PAIN  UNUSUAL VAGINAL DISCHARGE OR ITCHING   Items with * indicate a potential emergency and should be followed up as soon as possible or go to the Emergency Department if any problems should occur.  Please show the CHEMOTHERAPY ALERT CARD or IMMUNOTHERAPY  ALERT CARD at check-in to the Emergency Department and triage nurse.  Should you have questions after your visit or need to cancel or reschedule your appointment, please contact [facility] - A DEPT OF [facility]  Dept: [phone]  and follow the prompts.  Office hours are 8:00 a.m. to 4:30 p.m. Monday - Friday. Please note that voicemails left after 4:00 p.m. may not be returned until the following business day.  We are closed weekends and major holidays. You have access to a nurse at all times for urgent questions. Please call the main number to the clinic Dept: [phone] and follow the prompts.   For any non-urgent questions, you may also contact your provider using MyChart. We now offer e-Visits for anyone 5 and older to request care online for non-urgent symptoms. For details visit [email or web address].   Also download the MyChart app! Go to the app store, search "MyChart", open the app, select [facility], and log in with your MyChart username and password.

## 2024-04-02 ENCOUNTER — Inpatient Hospital Stay: Payer: Medicare Other

## 2024-04-02 VITALS — BP 111/52 | HR 87 | Resp 18

## 2024-04-02 DIAGNOSIS — Z79899 Other long term (current) drug therapy: Secondary | ICD-10-CM | POA: Diagnosis not present

## 2024-04-02 DIAGNOSIS — C9310 Chronic myelomonocytic leukemia not having achieved remission: Secondary | ICD-10-CM

## 2024-04-02 DIAGNOSIS — Z5111 Encounter for antineoplastic chemotherapy: Secondary | ICD-10-CM | POA: Diagnosis not present

## 2024-04-02 MED ORDER — ONDANSETRON HCL 4 MG/2ML IJ SOLN
8.0000 mg | Freq: Once | INTRAMUSCULAR | Status: AC
  Administered 2024-04-02: 8 mg via INTRAVENOUS
  Filled 2024-04-02: qty 4

## 2024-04-02 MED ORDER — AZACITIDINE CHEMO INJECTION 100 MG
75.0000 mg/m2 | Freq: Once | INTRAMUSCULAR | Status: AC
  Administered 2024-04-02: 114 mg via INTRAVENOUS
  Filled 2024-04-02: qty 11.4

## 2024-04-02 MED ORDER — SODIUM CHLORIDE 0.9 % IV SOLN: Freq: Once | INTRAVENOUS | Status: AC

## 2024-04-02 MED ORDER — HEPARIN SOD (PORK) LOCK FLUSH 100 UNIT/ML IV SOLN
500.0000 [IU] | Freq: Once | INTRAVENOUS | Status: DC | PRN
Start: 2024-04-02 — End: 2024-04-02

## 2024-04-02 MED ORDER — SODIUM CHLORIDE 0.9% FLUSH: 10.0000 mL | INTRAVENOUS | Status: DC | PRN

## 2024-04-02 NOTE — Patient Instructions (Signed)
 [facility] - A DEPT OF [facility]  Discharge Instructions: Thank you for choosing [facility] to provide your oncology and hematology care.   If you have a lab appointment with the Cancer Center, please go directly to the Cancer Center and check in at the registration area.   Wear comfortable clothing and clothing appropriate for easy access to any Portacath or PICC line.   We strive to give you quality time with your provider. You may need to reschedule your appointment if you arrive late (15 or more minutes).  Arriving late affects you and other patients whose appointments are after yours.  Also, if you miss three or more appointments without notifying the office, you may be dismissed from the clinic at the provider's discretion.      For prescription refill requests, have your pharmacy contact our office and allow 72 hours for refills to be completed.    Today you received the following chemotherapy and/or immunotherapy agents: Vidaza      To help prevent nausea and vomiting after your treatment, we encourage you to take your nausea medication as directed.  BELOW ARE SYMPTOMS THAT SHOULD BE REPORTED IMMEDIATELY: *FEVER GREATER THAN 100.4 F (38 C) OR HIGHER *CHILLS OR SWEATING *NAUSEA AND VOMITING THAT IS NOT CONTROLLED WITH YOUR NAUSEA MEDICATION *UNUSUAL SHORTNESS OF BREATH *UNUSUAL BRUISING OR BLEEDING *URINARY PROBLEMS (pain or burning when urinating, or frequent urination) *BOWEL PROBLEMS (unusual diarrhea, constipation, pain near the anus) TENDERNESS IN MOUTH AND THROAT WITH OR WITHOUT PRESENCE OF ULCERS (sore throat, sores in mouth, or a toothache) UNUSUAL RASH, SWELLING OR PAIN  UNUSUAL VAGINAL DISCHARGE OR ITCHING   Items with * indicate a potential emergency and should be followed up as soon as possible or go to the Emergency Department if any problems should occur.  Please show the CHEMOTHERAPY ALERT CARD or IMMUNOTHERAPY  ALERT CARD at check-in to the Emergency Department and triage nurse.  Should you have questions after your visit or need to cancel or reschedule your appointment, please contact [facility] - A DEPT OF [facility]  Dept: [phone]  and follow the prompts.  Office hours are 8:00 a.m. to 4:30 p.m. Monday - Friday. Please note that voicemails left after 4:00 p.m. may not be returned until the following business day.  We are closed weekends and major holidays. You have access to a nurse at all times for urgent questions. Please call the main number to the clinic Dept: [phone] and follow the prompts.   For any non-urgent questions, you may also contact your provider using MyChart. We now offer e-Visits for anyone 5 and older to request care online for non-urgent symptoms. For details visit [email or web address].   Also download the MyChart app! Go to the app store, search "MyChart", open the app, select [facility], and log in with your MyChart username and password.

## 2024-04-03 ENCOUNTER — Inpatient Hospital Stay: Payer: Medicare Other

## 2024-04-03 VITALS — BP 118/52 | HR 78 | Temp 98.2°F | Resp 18

## 2024-04-03 DIAGNOSIS — Z79899 Other long term (current) drug therapy: Secondary | ICD-10-CM | POA: Diagnosis not present

## 2024-04-03 DIAGNOSIS — C9311 Chronic myelomonocytic leukemia, in remission: Secondary | ICD-10-CM | POA: Diagnosis not present

## 2024-04-03 DIAGNOSIS — C9310 Chronic myelomonocytic leukemia not having achieved remission: Secondary | ICD-10-CM | POA: Diagnosis not present

## 2024-04-03 DIAGNOSIS — Z5111 Encounter for antineoplastic chemotherapy: Secondary | ICD-10-CM | POA: Diagnosis not present

## 2024-04-03 MED ORDER — ONDANSETRON HCL 4 MG/2ML IJ SOLN
8.0000 mg | Freq: Once | INTRAMUSCULAR | Status: AC
  Administered 2024-04-03: 8 mg via INTRAVENOUS

## 2024-04-03 MED ORDER — SODIUM CHLORIDE 0.9 % IV SOLN: Freq: Once | INTRAVENOUS | Status: AC

## 2024-04-03 MED ORDER — AZACITIDINE CHEMO INJECTION 100 MG
75.0000 mg/m2 | Freq: Once | INTRAMUSCULAR | Status: AC
  Administered 2024-04-03: 114 mg via INTRAVENOUS
  Filled 2024-04-03: qty 11.4

## 2024-04-04 ENCOUNTER — Emergency Department (HOSPITAL_COMMUNITY)
Admission: EM | Admit: 2024-04-04 | Discharge: 2024-04-04 | Disposition: A | Discharge status: Home / Self Care | Attending: Emergency Medicine | Admitting: Emergency Medicine

## 2024-04-04 ENCOUNTER — Other Ambulatory Visit: Payer: Self-pay

## 2024-04-04 ENCOUNTER — Encounter (HOSPITAL_COMMUNITY): Payer: Self-pay | Admitting: *Deleted

## 2024-04-04 ENCOUNTER — Emergency Department (HOSPITAL_COMMUNITY)

## 2024-04-04 ENCOUNTER — Telehealth: Payer: Self-pay

## 2024-04-04 ENCOUNTER — Inpatient Hospital Stay: Payer: Medicare Other

## 2024-04-04 DIAGNOSIS — Z7901 Long term (current) use of anticoagulants: Secondary | ICD-10-CM | POA: Insufficient documentation

## 2024-04-04 DIAGNOSIS — R609 Edema, unspecified: Secondary | ICD-10-CM | POA: Diagnosis not present

## 2024-04-04 DIAGNOSIS — X58XXXA Exposure to other specified factors, initial encounter: Secondary | ICD-10-CM | POA: Diagnosis not present

## 2024-04-04 DIAGNOSIS — S9002XA Contusion of left ankle, initial encounter: Secondary | ICD-10-CM | POA: Diagnosis present

## 2024-04-04 DIAGNOSIS — C9310 Chronic myelomonocytic leukemia not having achieved remission: Secondary | ICD-10-CM

## 2024-04-04 DIAGNOSIS — Z5111 Encounter for antineoplastic chemotherapy: Secondary | ICD-10-CM | POA: Diagnosis not present

## 2024-04-04 DIAGNOSIS — Z79899 Other long term (current) drug therapy: Secondary | ICD-10-CM | POA: Diagnosis not present

## 2024-04-04 DIAGNOSIS — M7989 Other specified soft tissue disorders: Secondary | ICD-10-CM | POA: Diagnosis not present

## 2024-04-04 LAB — CBC WITH DIFFERENTIAL/PLATELET
Abs Immature Granulocytes: 0.49 10*3/uL — ABNORMAL HIGH (ref 0.00–0.07)
Basophils Absolute: 0.5 10*3/uL — ABNORMAL HIGH (ref 0.0–0.1)
Basophils Relative: 3 %
Eosinophils Absolute: 1 10*3/uL — ABNORMAL HIGH (ref 0.0–0.5)
Eosinophils Relative: 5 %
HCT: 31.6 % — ABNORMAL LOW (ref 36.0–46.0)
Hemoglobin: 9.8 g/dL — ABNORMAL LOW (ref 12.0–15.0)
Immature Granulocytes: 3 %
Lymphocytes Relative: 7 %
Lymphs Abs: 1.2 10*3/uL (ref 0.7–4.0)
MCH: 33 pg (ref 26.0–34.0)
MCHC: 31 g/dL (ref 30.0–36.0)
MCV: 106.4 fL — ABNORMAL HIGH (ref 80.0–100.0)
Monocytes Absolute: 4.8 10*3/uL — ABNORMAL HIGH (ref 0.1–1.0)
Monocytes Relative: 27 %
Neutro Abs: 10.1 10*3/uL — ABNORMAL HIGH (ref 1.7–7.7)
Neutrophils Relative %: 55 %
Platelets: 184 10*3/uL (ref 150–400)
RBC: 2.97 MIL/uL — ABNORMAL LOW (ref 3.87–5.11)
RDW: 15.3 % (ref 11.5–15.5)
WBC: 18 10*3/uL — ABNORMAL HIGH (ref 4.0–10.5)
nRBC: 0 % (ref 0.0–0.2)

## 2024-04-04 LAB — COMPREHENSIVE METABOLIC PANEL WITH GFR
ALT: 14 U/L (ref 0–44)
AST: 20 U/L (ref 15–41)
Albumin: 3.6 g/dL (ref 3.5–5.0)
Alkaline Phosphatase: 40 U/L (ref 38–126)
Anion gap: 9 (ref 5–15)
BUN: 28 mg/dL — ABNORMAL HIGH (ref 8–23)
CO2: 24 mmol/L (ref 22–32)
Calcium: 8.8 mg/dL — ABNORMAL LOW (ref 8.9–10.3)
Chloride: 100 mmol/L (ref 98–111)
Creatinine, Ser: 1.17 mg/dL — ABNORMAL HIGH (ref 0.44–1.00)
GFR, Estimated: 48 mL/min — ABNORMAL LOW (ref >60)
Glucose, Bld: 121 mg/dL — ABNORMAL HIGH (ref 70–99)
Potassium: 3.7 mmol/L (ref 3.5–5.1)
Sodium: 133 mmol/L — ABNORMAL LOW (ref 135–145)
Total Bilirubin: 1.1 mg/dL (ref 0.0–1.2)
Total Protein: 6.4 g/dL — ABNORMAL LOW (ref 6.5–8.1)

## 2024-04-04 MED ORDER — AZACITIDINE CHEMO INJECTION 100 MG
75.0000 mg/m2 | Freq: Once | INTRAMUSCULAR | Status: AC
  Administered 2024-04-04: 114 mg via INTRAVENOUS
  Filled 2024-04-04: qty 11.4

## 2024-04-04 MED ORDER — ENSURE MAX PROTEIN PO LIQD
11.0000 [oz_av] | Freq: Every day | ORAL | Status: DC
  Administered 2024-04-04: 11 [oz_av] via ORAL
  Filled 2024-04-04: qty 330

## 2024-04-04 MED ORDER — SODIUM CHLORIDE 0.9 % IV SOLN: Freq: Once | INTRAVENOUS | Status: AC

## 2024-04-04 MED ORDER — ONDANSETRON HCL 4 MG/2ML IJ SOLN
8.0000 mg | Freq: Once | INTRAMUSCULAR | Status: AC
  Administered 2024-04-04: 8 mg via INTRAVENOUS
  Filled 2024-04-04: qty 4

## 2024-04-04 MED ORDER — HEPARIN SOD (PORK) LOCK FLUSH 100 UNIT/ML IV SOLN
500.0000 [IU] | Freq: Once | INTRAVENOUS | Status: AC | PRN
Start: 2024-04-04 — End: 2024-04-04
  Administered 2024-04-04: 500 [IU]

## 2024-04-04 MED ORDER — SODIUM CHLORIDE 0.9% FLUSH
10.0000 mL | INTRAVENOUS | Status: DC | PRN
  Administered 2024-04-04: 10 mL

## 2024-04-04 NOTE — Discharge Instructions (Signed)
 Return for any problem.  ?

## 2024-04-04 NOTE — Telephone Encounter (Signed)
 Patients husband called in stating that his wife seen the [facility] this week and told her about the discoloration of her legs so Vincent Gros NP ordered a doppler for her. She had it done and it was negative. The patient woke up this morning stating she had a lump on the side of her left leg that is painful to the touch, it is discolored even more, and it is hot to the touch. I spoke with Janett Billow RN and she advised that she needs to go to the ER to be seen this morning. The patient didn't want to go but she agreed to go be seen.

## 2024-04-04 NOTE — ED Provider Notes (Signed)
 [place] EMERGENCY DEPARTMENT AT [facility] Provider Note   CSN: [id number] Arrival date & time: 04/04/24  4098     History  Chief Complaint  Patient presents with   Leg Pain    [name] is a 78 y.o. female.  78 year old female with prior medical history as detailed below presents for evaluation.  Patient complains of tender bruise to the anterior medial aspect of the left ankle.  She reports that she woke up with this bruise on her ankle today.  She denies other complaint.  She is on Eliquis.  She denies specific injury.  She is ambulatory without difficulty.  She apparently contacted her regular doctor who advised her to come to the ED.  The history is provided by the patient and the spouse.       Home Medications Prior to Admission medications   Medication Sig Start Date End Date Taking? Authorizing Provider  acetaminophen (TYLENOL) 500 MG tablet Take 2 tablets (1,000 mg total) by mouth 3 (three) times daily. 11/14/23   Rhetta Mura, MD  apixaban (ELIQUIS) 5 MG TABS tablet Take 1 tablet (5 mg total) by mouth 2 (two) times daily. 06/05/23   [name], Deloris Ping, MD  denosumab (PROLIA) 60 MG/ML SOSY injection Inject 60 mg into the skin every 6 (six) months.    [provider]  diltiazem (CARDIZEM CD) 360 MG 24 hr capsule Take 1 capsule (360 mg total) by mouth daily. 06/05/23   [name], Deloris Ping, MD  furosemide (LASIX) 20 MG tablet Take 1 tablet (20 mg total) by mouth daily. 05/21/22 11/12/23  Erick Blinks, MD  gabapentin (NEURONTIN) 300 MG capsule Take 300 mg by mouth in the morning. 07/21/14   [provider]  Metoprolol Tartrate 75 MG TABS Take 2 tablets (150 mg total) by mouth 2 (two) times daily. 07/25/23   [name], Deloris Ping, MD  predniSONE (DELTASONE) 5 MG tablet Take 5 mg by mouth daily with breakfast. 09/22/21   [provider]  Tiotropium Bromide-Olodaterol (STIOLTO RESPIMAT) 2.5-2.5 MCG/ACT AERS Inhale 2 puffs into the lungs  daily. 04/23/23   Josephine Igo, DO      Allergies    Digoxin and related, Gluten meal, Penicillins, Fexofenadine, Alendronate, and Hydroxychloroquine    Review of Systems   Review of Systems  All other systems reviewed and are negative.   Physical Exam Updated Vital Signs BP 130/71   Pulse (!) 137   Temp 98 F (36.7 C)   Resp 18   Ht 5\' 3"  (1.6 m)   Wt 49.1 kg   SpO2 92%   BMI 19.16 kg/m  Physical Exam Vitals and nursing note reviewed.  Constitutional:      General: She is not in acute distress.    Appearance: Normal appearance. She is well-developed.  HENT:     Head: Normocephalic and atraumatic.  Eyes:     Conjunctiva/sclera: Conjunctivae normal.     Pupils: Pupils are equal, round, and reactive to light.  Cardiovascular:     Rate and Rhythm: Normal rate and regular rhythm.     Heart sounds: Normal heart sounds.  Pulmonary:     Effort: Pulmonary effort is normal. No respiratory distress.     Breath sounds: Normal breath sounds.  Abdominal:     General: There is no distension.     Palpations: Abdomen is soft.     Tenderness: There is no abdominal tenderness.  Musculoskeletal:        General: No  deformity. Normal range of motion.     Cervical back: Normal range of motion and neck supple.  Skin:    General: Skin is warm and dry.     Comments: Hematoma to the anterior medial aspect of the left ankle.  This is consistent with ecchymosis secondary to varicose vein rupture.  No overlying erythema.  No evidence of cellulitis.  Neurological:     General: No focal deficit present.     Mental Status: She is alert and oriented to person, place, and time.     ED Results / Procedures / Treatments   Labs (all labs ordered are listed, but only abnormal results are displayed) Labs Reviewed - No data to display  EKG None  Radiology No results found.  Procedures Procedures    Medications Ordered in ED Medications - No data to display  ED Course/ Medical  Decision Making/ A&P                                 Medical Decision Making Amount and/or Complexity of Data Reviewed Labs: ordered. Radiology: ordered.  Risk OTC drugs.    Medical Screen Complete  This patient presented to the ED with complaint of bruise to left ankle.  This complaint involves an extensive number of treatment options. The initial differential diagnosis includes, but is not limited to, ruptured varicose vein, metabolic abnormality, etc.  This presentation is: Acute, Chronic, Self-Limited, Previously Undiagnosed, Uncertain Prognosis, Complicated, Systemic Symptoms, and Threat to Life/Bodily Function  Patient presents with hematoma likely secondary to varicose vein on left ankle.  Patient is on Eliquis.  She is very concerned about other significant pathology such as fracture or DVT.  Despite my attempts to verbally reassure her she insist on workup.  Obtained labs are without significant abnormality.  Obtained imaging including x-ray and ultrasound to rule out DVT is without significant abnormality.  Patient is reassured by workup.  She is appropriate for discharge home.  Importance of close follow-up stressed.  Co morbidities that complicated the patient's evaluation  See HPI   Additional history obtained:  External records from outside sources obtained and reviewed including prior ED visits and prior Inpatient records.    Problem List / ED Course:  Hematoma, left ankle   Disposition:  After consideration of the diagnostic results and the patients response to treatment, I feel that the patent would benefit from close outpatient followup.          Final Clinical Impression(s) / ED Diagnoses Final diagnoses:  Contusion of left ankle, initial encounter    Rx / DC Orders ED Discharge Orders     None         Wynetta Fines, MD 04/04/24 1301

## 2024-04-04 NOTE — ED Triage Notes (Addendum)
 Pt arrives with left ankle pain with and swelling. Pt had similar in rt ankle last week and r/o DVT, this morning woke with left ankle involvement. She had last chemo yesterday and will f/u with another tx today at 1400.   During triage she states the rt ankle is starting to bother her again.  Pain is more of a shooting pain than constant.

## 2024-04-23 ENCOUNTER — Ambulatory Visit: Admitting: Nurse Practitioner

## 2024-04-23 ENCOUNTER — Encounter: Payer: Self-pay | Admitting: Nurse Practitioner

## 2024-04-23 VITALS — BP 100/86 | HR 52 | Ht 61.0 in | Wt 107.2 lb

## 2024-04-23 DIAGNOSIS — C9210 Chronic myeloid leukemia, BCR/ABL-positive, not having achieved remission: Secondary | ICD-10-CM | POA: Diagnosis not present

## 2024-04-23 DIAGNOSIS — J449 Chronic obstructive pulmonary disease, unspecified: Secondary | ICD-10-CM

## 2024-04-23 DIAGNOSIS — C9310 Chronic myelomonocytic leukemia not having achieved remission: Secondary | ICD-10-CM

## 2024-04-23 DIAGNOSIS — R918 Other nonspecific abnormal finding of lung field: Secondary | ICD-10-CM | POA: Diagnosis not present

## 2024-04-23 DIAGNOSIS — C7800 Secondary malignant neoplasm of unspecified lung: Secondary | ICD-10-CM

## 2024-04-23 MED ORDER — ALBUTEROL SULFATE HFA 108 (90 BASE) MCG/ACT IN AERS: 2.0000 | INHALATION_SPRAY | Freq: Four times a day (QID) | RESPIRATORY_TRACT | 2 refills | Status: DC | PRN

## 2024-04-23 NOTE — Progress Notes (Signed)
 @Patient  ID: [name], female    DOB: July 16, 1946, 78 y.o.   MRN: [id number]  Chief Complaint  Patient presents with   Follow-up    Patient states she doing well    Referring provider: Elester Grim, MD  HPI: 78 year old female, former smoker followed for COPD Gold 2 and lung nodules. She is a patient of Dr. Glenis Langdon and last seen in office 04/23/2023. Past medical history significant for rheumatoid arthritis on remicade  and daily prednisone , PAF, CHF, celiac disease, GERD, CKD stage 3. She is followed by Dr. Meredith Stalls with rheumatology. 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 Carlye Child.   TEST/EVENTS:  05/03/2021 PFT: FVC 80, FEV1 73, ratio 68, TLC 89, DLCOcor 55. No BD 03/29/2022 CTA chest: mild cardiomegaly. moderate right and trace left pleural effusions. Patchy airspace opacities in RLL and LUL; mild opacity in LLL. Numerous right-sided pleural based mases increased in size. Right sided paraspinal soft tissue mass at the T10 level, now 4.7x2. 03/30/2022 echo: 45-50%. Global hypokinesis. RV function mildly reduced. RV size nl. Mildly elevated PASP. Mild to moderate MR.   09/27/2022: OV with Dr. Thelda Finney. Last seen in March. 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 She also had a paraspinal soft tissue mass at the level of T10. Diagnosed via CT guided tissue biopsy. Currently undergoing chemo treatments for CMML. Recently had b/l pneumonia, worse on right than left. Plan to repeat CXR in 4 weeks for 6 week f/u.   10/25/2022: OV with Leomia Blake NP for follow up to assess for resolution of previously treated CAP. She is doing quite well from a pulmonary standpoint. No significant dyspnea, cough or chest congestion. She does have some fatigue but attributes that to her CMML treatments; unchanged. No fevers, chills, hemoptysis. Continues on Stiolto daily. Rarely uses albuterol . Currently undergoing treatment for a non-healing RLE  wound.   04/23/2023: OV with Dr. Thelda Finney. Currently under treatment for CMML from medical oncology. Also has infliximab  infusions from rheumatology. 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 Currently on Stiolto. Stable on this.   04/23/2024: Today - follow up Patient presents today for follow up. She has been doing well since she was here last. No issues with her breathing. No chest congestion, wheezing, cough. She still uses her Stiolto daily. Hasn't ever had to use her albuterol . She doesn't think she even has one at home. She's still on treatment for CML and followed by oncology. Has not had a recent CT scan.   Allergies  Allergen Reactions   Digoxin  And Related Rash    Generalized total body rash with immense itching   Gluten Meal Diarrhea   Penicillins Rash and Other (See Comments)      Did it involve swelling of the face/tongue/throat, SOB, or low BP? No Did it involve sudden or severe rash/hives, skin peeling, or any reaction on the inside of your mouth or nose? Yes Did you need to seek medical attention at a hospital or doctor's office? No When did it last happen?    "Many Years Ago"  If all above answers are "NO", may proceed with cephalosporin use.      Fexofenadine     Other Reaction(s): dry mouth   Alendronate Rash   Hydroxychloroquine Rash    Immunization History  Administered Date(s) Administered   DTaP / Hep B / IPV 09/03/2018, 03/17/2019, 09/26/2019, 03/29/2020, 10/08/2020, 04/11/2021   Fluad Quad(high Dose 65+) 09/25/2019  H1N1 11/17/2008   Influenza Split 10/28/2009, 10/27/2010   Influenza, High Dose Seasonal PF 10/05/2015, 09/15/2016, 09/17/2017, 09/03/2018, 09/06/2019, 10/08/2020   Influenza,inj,quad, With Preservative 10/29/2014   Influenza-Unspecified 09/19/2012, 10/15/2013   PFIZER(Purple Top)SARS-COV-2 Vaccination 01/16/2020, 02/06/2020, 08/16/2020   Pneumococcal Conjugate-13 07/14/2014   Pneumococcal Polysaccharide-23 07/09/2012    Td 05/27/2004   Tdap 07/09/2012, 11/10/2019, 12/17/2022   Zoster, Live 08/22/2012    Past Medical History:  Diagnosis Date   Arthritis    Rheumatoid arthritis   Celiac disease    Chronic kidney disease    stage 3 from MD notes   COPD (chronic obstructive pulmonary disease) (HCC)    Dyspnea    with going up stairs   Family history of adverse reaction to anesthesia    father had hard time waking up   Headache    sinus headaches   Hot flashes    Hypertension    Iron deficiency anemia    Pneumonia    per patient "I have walking pneumonia"    Tobacco History: Social History   Tobacco Use  Smoking Status Former   Current packs/day: 0.00   Average packs/day: 1 pack/day for 30.0 years (30.0 ttl pk-yrs)   Types: Cigarettes   Start date: 60   Quit date: 2005   Years since quitting: 20.3  Smokeless Tobacco Never   Counseling given: Not Answered   Outpatient Medications Prior to Visit  Medication Sig Dispense Refill   acetaminophen  (TYLENOL ) 500 MG tablet Take 2 tablets (1,000 mg total) by mouth 3 (three) times daily. 30 tablet 0   apixaban  (ELIQUIS ) 5 MG TABS tablet Take 1 tablet (5 mg total) by mouth 2 (two) times daily. 60 tablet 11   azaCITIDine  (VIDAZA ) 100 MG SUSR 114mg  Injection every 6 weeks     denosumab  (PROLIA ) 60 MG/ML SOSY injection Inject 60 mg into the skin every 6 (six) months.     diltiazem  (CARDIZEM  CD) 360 MG 24 hr capsule Take 1 capsule (360 mg total) by mouth daily. 90 capsule 3   gabapentin  (NEURONTIN ) 300 MG capsule Take 300 mg by mouth in the morning.     Metoprolol  Tartrate 75 MG TABS Take 2 tablets (150 mg total) by mouth 2 (two) times daily. 360 tablet 2   predniSONE  (DELTASONE ) 5 MG tablet Take 5 mg by mouth daily with breakfast.     Tiotropium Bromide-Olodaterol (STIOLTO RESPIMAT ) 2.5-2.5 MCG/ACT AERS Inhale 2 puffs into the lungs daily. 4 g 11   furosemide  (LASIX ) 20 MG tablet Take 1 tablet (20 mg total) by mouth daily. 30 tablet 11    Facility-Administered Medications Prior to Visit  Medication Dose Route Frequency Provider Last Rate Last Admin   acetaminophen  (TYLENOL ) 325 MG tablet            diphenhydrAMINE  (BENADRYL ) 25 mg capsule              Review of Systems:   Constitutional: No weight loss or gain, night sweats, fevers, chills. +fatigue, lassitude. (Baseline) HEENT: No headaches, difficulty swallowing, tooth/dental problems, or sore throat. No sneezing, itching, ear ache, nasal congestion, or post nasal drip CV:  No chest pain, orthopnea, PND, swelling in lower extremities, anasarca, dizziness, palpitations, syncope Resp: No shortness of breath with exertion or at rest. No excess mucus or change in color of mucus. No productive or non-productive. No hemoptysis. No wheezing.  No chest wall deformity Neuro: No dizziness or lightheadedness.  Psych: No depression or anxiety. Mood stable.     Physical  Exam:  BP 100/86 (BP Location: Right Arm, Patient Position: Sitting, Cuff Size: Normal)   Pulse (!) 52   Ht 5\' 1"  (1.549 m)   Wt 107 lb 3.2 oz (48.6 kg)   SpO2 97%   BMI 20.26 kg/m   GEN: Pleasant, interactive, chronically-ill appearing; elderly, frail; in no acute distress. HEENT:  Normocephalic and atraumatic. PERRLA. Sclera white. Nasal turbinates pink, moist and patent bilaterally. No rhinorrhea present. Oropharynx pink and moist, without exudate or edema. No lesions, ulcerations, or postnasal drip.  NECK:  Supple w/ fair ROM. No JVD present. Normal carotid impulses w/o bruits. Thyroid  symmetrical with no goiter or nodules palpated. No lymphadenopathy.   CV: RRR, no m/r/g, no peripheral edema. Pulses intact, +2 bilaterally. No cyanosis, pallor or clubbing. PULMONARY:  Unlabored, regular breathing. Clear bilaterally A&P w/o wheezes/rales/rhonchi. No accessory muscle use.  GI: BS present and normoactive. Soft, non-tender to palpation. No organomegaly or masses detected.  MSK: No deformities or joint  swelling noted.  Neuro: A/Ox3. No focal deficits noted.   Psych: Normal affect and behavior. Judgement and thought content appropriate.     Lab Results:  CBC    Component Value Date/Time   WBC 18.0 (H) 04/04/2024 0903   RBC 2.97 (L) 04/04/2024 0903   HGB 9.8 (L) 04/04/2024 0903   HGB 10.5 (L) 03/31/2024 1311   HGB 10.8 (L) 01/03/2017 1246   HCT 31.6 (L) 04/04/2024 0903   HCT 32.8 (L) 01/03/2017 1246   PLT 184 04/04/2024 0903   PLT 222 03/31/2024 1311   PLT 163 01/03/2017 1246   MCV 106.4 (H) 04/04/2024 0903   MCV 102.8 (H) 01/03/2017 1246   MCH 33.0 04/04/2024 0903   MCHC 31.0 04/04/2024 0903   RDW 15.3 04/04/2024 0903   RDW 14.3 01/03/2017 1246   LYMPHSABS 1.2 04/04/2024 0903   LYMPHSABS 2.9 01/03/2017 1246   MONOABS 4.8 (H) 04/04/2024 0903   MONOABS 1.1 (H) 01/03/2017 1246   EOSABS 1.0 (H) 04/04/2024 0903   EOSABS 0.1 01/03/2017 1246   BASOSABS 0.5 (H) 04/04/2024 0903   BASOSABS 0.0 01/03/2017 1246    BMET    Component Value Date/Time   NA 133 (L) 04/04/2024 0903   NA 141 01/04/2016 1001   K 3.7 04/04/2024 0903   K 4.5 01/04/2016 1001   CL 100 04/04/2024 0903   CO2 24 04/04/2024 0903   CO2 24 01/04/2016 1001   GLUCOSE 121 (H) 04/04/2024 0903   GLUCOSE 105 01/04/2016 1001   BUN 28 (H) 04/04/2024 0903   BUN 21.7 01/04/2016 1001   CREATININE 1.17 (H) 04/04/2024 0903   CREATININE 0.97 03/31/2024 1311   CREATININE 1.2 (H) 01/04/2016 1001   CALCIUM  8.8 (L) 04/04/2024 0903   CALCIUM  9.7 01/04/2016 1001   GFRNONAA 48 (L) 04/04/2024 0903   GFRNONAA >60 03/31/2024 1311   GFRAA 56 (L) 01/09/2020 1339    BNP    Component Value Date/Time   BNP 148.4 (H) 05/21/2022 0345     Imaging:  VAS US  LOWER EXTREMITY VENOUS (DVT) (ONLY [facility] & [facility]) Result Date: 04/04/2024  Lower Venous DVT Study Patient Name:  AARIANNA NALLEY  Date of Exam:   04/04/2024 Medical Rec #: [id number]         Accession #:    [id number] Date of Birth: 1946/10/26        Patient Gender: F Patient  Age:   21 years Exam Location:  [facility] Procedure:      VAS US  LOWER  EXTREMITY VENOUS (DVT) Referring Phys: PETER MESSICK --------------------------------------------------------------------------------  Indications: Pain, and Swelling.  Comparison Study: Previous study of the right lower extremity on 4.8.2025. Performing Technologist: Ria Chad  Examination Guidelines: A complete evaluation includes B-mode imaging, spectral Doppler, color Doppler, and power Doppler as needed of all accessible portions of each vessel. Bilateral testing is considered an integral part of a complete examination. Limited examinations for reoccurring indications may be performed as noted. The reflux portion of the exam is performed with the patient in reverse Trendelenburg.  +-----+---------------+---------+-----------+----------+--------------+ RIGHTCompressibilityPhasicitySpontaneityPropertiesThrombus Aging +-----+---------------+---------+-----------+----------+--------------+ CFV  Full           Yes      Yes                                 +-----+---------------+---------+-----------+----------+--------------+ SFJ  Full           Yes      Yes                                 +-----+---------------+---------+-----------+----------+--------------+   +---------+---------------+---------+-----------+----------+-------------------+ LEFT     CompressibilityPhasicitySpontaneityPropertiesThrombus Aging      +---------+---------------+---------+-----------+----------+-------------------+ CFV      Full           Yes      Yes                                      +---------+---------------+---------+-----------+----------+-------------------+ SFJ      Full           Yes      Yes                                      +---------+---------------+---------+-----------+----------+-------------------+ FV Prox  Full                                                              +---------+---------------+---------+-----------+----------+-------------------+ FV Mid   Full                                                             +---------+---------------+---------+-----------+----------+-------------------+ FV DistalFull                                                             +---------+---------------+---------+-----------+----------+-------------------+ PFV      Partial                                                          +---------+---------------+---------+-----------+----------+-------------------+  POP      Full           Yes      Yes                                      +---------+---------------+---------+-----------+----------+-------------------+ PTV                                                   Not well visualized +---------+---------------+---------+-----------+----------+-------------------+ PERO     Full                                                             +---------+---------------+---------+-----------+----------+-------------------+ Area of concern: Left distal calf with complex collection possible hematoma measuring 2.9 x 1.1 x 2.3 cm.   Summary: RIGHT: - No evidence of common femoral vein obstruction.   LEFT: - There is no evidence of deep vein thrombosis in the lower extremity. However, portions of this examination were limited- see technologist comments above.  *See table(s) above for measurements and observations. Electronically signed by Irvin Mantel on 04/04/2024 at 8:59:06 PM.    Final    DG Ankle Complete Left Result Date: 04/04/2024 CLINICAL DATA:  Left ankle swelling. EXAM: LEFT ANKLE COMPLETE - 3+ VIEW COMPARISON:  None Available. FINDINGS: There is no evidence of fracture, dislocation, or joint effusion. There is no evidence of arthropathy or other focal bone abnormality. Soft tissues are unremarkable. IMPRESSION: Negative. Electronically Signed   By: Rosalene Colon M.D.   On: 04/04/2024 09:54    VAS US  LOWER EXTREMITY VENOUS (DVT) Result Date: 04/01/2024  Lower Venous DVT Study Patient Name:  ADILEE CARDINAL [facility]  Date of Exam:   04/01/2024 Medical Rec #: [id number]         Accession #:    [id number] Date of Birth: [birth year]        Patient Gender: F Patient Age:   25 years Exam Location:  [facility] Procedure:      VAS US  LOWER EXTREMITY VENOUS (DVT) Referring Phys: HEATHER BOSCIA --------------------------------------------------------------------------------  Indications: Pain, and Swelling.  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 Anticoagulation: Eliquis . Comparison Study: Previous exam on 03/30/2022 was negative for DVT Performing Technologist: Arlyce Berger RVT, RDMS  Examination Guidelines: A complete evaluation includes B-mode imaging, spectral Doppler, color Doppler, and power Doppler as needed of all accessible portions of each vessel. Bilateral testing is considered an integral part of a complete examination. Limited examinations for reoccurring indications may be performed as noted. The reflux portion of the exam is performed with the patient in reverse Trendelenburg.  +---------+---------------+---------+-----------+----------+-------------------+ RIGHT    CompressibilityPhasicitySpontaneityPropertiesThrombus Aging      +---------+---------------+---------+-----------+----------+-------------------+ CFV      Full           No       Yes        pulsatile                     +---------+---------------+---------+-----------+----------+-------------------+ SFJ      Full                                                             +---------+---------------+---------+-----------+----------+-------------------+  FV Prox  Full           Yes      Yes                                      +---------+---------------+---------+-----------+----------+-------------------+ FV Mid   Full           Yes      Yes                                       +---------+---------------+---------+-----------+----------+-------------------+ FV DistalFull           Yes      Yes                                      +---------+---------------+---------+-----------+----------+-------------------+ PFV      Full                                                             +---------+---------------+---------+-----------+----------+-------------------+ POP      Full           Yes      Yes                                      +---------+---------------+---------+-----------+----------+-------------------+ PTV      Full                                         Not well visualized +---------+---------------+---------+-----------+----------+-------------------+ PERO     Full                                         Not well visualized +---------+---------------+---------+-----------+----------+-------------------+     Summary: RIGHT: - There is no evidence of deep vein thrombosis in the lower extremity.  - No cystic structure found in the popliteal fossa. Subcutaneous edema of calf and ankle  LEFT: - No evidence of common femoral vein obstruction.   *See table(s) above for measurements and observations. Electronically signed by Angela Kell MD on 04/01/2024 at 7:47:42 PM.    Final     azaCITIDine  (VIDAZA ) 114 mg in sodium chloride  0.9 % 50 mL chemo infusion     Date Action Dose Route User   Discharged on 04/04/2024   Admitted on 04/04/2024   03/31/2024 1537 Rate/Dose Change (none) Intravenous Oraegbunam, Ifeoma K, RN   03/31/2024 1537 Rate/Dose Change (none) Intravenous Oraegbunam, Ifeoma K, RN   03/31/2024 1522 New Bag/Given 114 mg Intravenous Oraegbunam, Ifeoma K, RN      azaCITIDine  (VIDAZA ) 114 mg in sodium chloride  0.9 % 50 mL chemo infusion     Date Action Dose Route User   Discharged on 04/04/2024   Admitted on 04/04/2024   04/01/2024 1435 Infusion Verify (none) Intravenous Terrie Fetter, RN  04/01/2024 1435 Rate/Dose Change (none)  Intravenous Terrie Fetter, RN   04/01/2024 1435 New Bag/Given 114 mg Intravenous Terrie Fetter, RN      azaCITIDine  (VIDAZA ) 114 mg in sodium chloride  0.9 % 50 mL chemo infusion     Date Action Dose Route User   Discharged on 04/04/2024   Admitted on 04/04/2024   04/02/2024 1454 Infusion Verify (none) Intravenous Thermon Flattery, RN   04/02/2024 1454 New Bag/Given 114 mg Intravenous Purgason, Cortney D, RN      azaCITIDine  (VIDAZA ) 114 mg in sodium chloride  0.9 % 50 mL chemo infusion     Date Action Dose Route User   Discharged on 04/04/2024   Admitted on 04/04/2024   04/03/2024 1502 Infusion Verify (none) Intravenous Blair Bumpers, RN   04/03/2024 1502 Rate/Dose Change (none) Intravenous Blair Bumpers, RN   04/03/2024 1502 New Bag/Given 114 mg Intravenous Blair Bumpers, RN      azaCITIDine  (VIDAZA ) 114 mg in sodium chloride  0.9 % 50 mL chemo infusion     Date Action Dose Route User   04/04/2024 1443 Infusion Verify (none) Intravenous Dallis Dues, RN   04/04/2024 1443 Rate/Dose Change (none) Intravenous Dallis Dues, RN   04/04/2024 1443 New Bag/Given 114 mg Intravenous Dallis Dues, RN      heparin  lock flush 100 unit/mL     Date Action Dose Route User   Discharged on 04/04/2024   Admitted on 04/04/2024   03/31/2024 1553 Given 500 Units Intracatheter Oraegbunam, Ifeoma K, RN      heparin  lock flush 100 unit/mL     Date Action Dose Route User   Discharged on 04/04/2024   Admitted on 04/04/2024   04/01/2024 1452 Given 500 Units Intracatheter [name], Carrie E, RN      heparin  lock flush 100 unit/mL     Date Action Dose Route User   04/04/2024 1444 Given 500 Units Intracatheter Dallis Dues, RN      ondansetron  (ZOFRAN ) injection 8 mg     Date Action Dose Route User   Discharged on 04/04/2024   Admitted on 04/04/2024   03/31/2024 1438 Given 8 mg Intravenous Oraegbunam, Ifeoma K, RN      ondansetron  (ZOFRAN ) injection 8 mg     Date Action Dose Route  User   Discharged on 04/04/2024   Admitted on 04/04/2024   04/01/2024 1358 Given 8 mg Intravenous [name], Carrie E, RN      ondansetron  (ZOFRAN ) injection 8 mg     Date Action Dose Route User   Discharged on 04/04/2024   Admitted on 04/04/2024   04/02/2024 1413 Given 8 mg Intravenous Purgason, Cortney D, RN      ondansetron  (ZOFRAN ) injection 8 mg     Date Action Dose Route User   Discharged on 04/04/2024   Admitted on 04/04/2024   04/03/2024 1430 Given 8 mg Intravenous Blair Bumpers, RN      ondansetron  (ZOFRAN ) injection 8 mg     Date Action Dose Route User   04/04/2024 1409 Given 8 mg Intravenous Dallis Dues, RN      0.9 %  sodium chloride  infusion     Date Action Dose Route User   Discharged on 04/04/2024   Admitted on 04/04/2024   03/31/2024 1546 Rate/Dose Change (none) Intravenous Oraegbunam, Ifeoma K, RN   03/31/2024 1543 Rate/Dose Change (none) Intravenous Oraegbunam, Ifeoma K, RN   03/31/2024 1515 Infusion Verify (none) Intravenous Oraegbunam, Ifeoma K, RN  03/31/2024 1515 Infusion Verify (none) Intravenous Oraegbunam, Ifeoma K, RN   03/31/2024 1445 Rate/Dose Change (none) Intravenous Oraegbunam, Ifeoma K, RN      0.9 %  sodium chloride  infusion     Date Action Dose Route User   Discharged on 04/04/2024   Admitted on 04/04/2024   04/01/2024 1453 Rate/Dose Change (none) Intravenous Terrie Fetter, RN   04/01/2024 1435 Infusion Verify (none) Intravenous Terrie Fetter, RN   04/01/2024 1409 Rate/Dose Change (none) Intravenous Terrie Fetter, RN   04/01/2024 1405 Rate/Dose Change (none) Intravenous Terrie Fetter, RN   04/01/2024 1359 Rate/Dose Change (none) Intravenous Terrie Fetter, RN      0.9 %  sodium chloride  infusion     Date Action Dose Route User   Discharged on 04/04/2024   Admitted on 04/04/2024   04/02/2024 1519 Rate/Dose Change (none) Intravenous Thermon Flattery, RN   04/02/2024 1516 Rate/Dose Change (none) Intravenous Thermon Flattery, RN   04/02/2024 1452  Infusion Verify (none) Intravenous Thermon Flattery, RN   04/02/2024 1422 Infusion Verify (none) Intravenous Thermon Flattery, RN   04/02/2024 1422 Rate/Dose Change (none) Intravenous Thermon Flattery, RN      0.9 %  sodium chloride  infusion     Date Action Dose Route User   Discharged on 04/04/2024   Admitted on 04/04/2024   04/03/2024 1436 Rate/Dose Change (none) Intravenous Blair Bumpers, RN   04/03/2024 1430 New Bag/Given (none) Intravenous Blair Bumpers, RN      0.9 %  sodium chloride  infusion     Date Action Dose Route User   04/04/2024 1504 Rate/Dose Change (none) Intravenous Dallis Dues, RN   04/04/2024 1501 Rate/Dose Change (none) Intravenous Dallis Dues, RN   04/04/2024 1443 Infusion Verify (none) Intravenous Dallis Dues, RN   04/04/2024 1416 Restarted (none) Intravenous Dallis Dues, RN   04/04/2024 1407 New Bag/Given (none) Intravenous Dallis Dues, RN      sodium chloride  flush (NS) 0.9 % injection 10 mL     Date Action Dose Route User   Discharged on 04/04/2024   Admitted on 04/04/2024   03/31/2024 1316 Given 10 mL Intracatheter Amison, Alyssa M, LPN      sodium chloride  flush (NS) 0.9 % injection 10 mL     Date Action Dose Route User   Discharged on 04/04/2024   Admitted on 04/04/2024   03/31/2024 1553 Given 10 mL Intracatheter Oraegbunam, Ifeoma K, RN      sodium chloride  flush (NS) 0.9 % injection 10 mL     Date Action Dose Route User   Discharged on 04/04/2024   Admitted on 04/04/2024   04/01/2024 1452 Given 10 mL Intracatheter Terrie Fetter, RN      sodium chloride  flush (NS) 0.9 % injection 10 mL     Date Action Dose Route User   04/04/2024 1444 Given 10 mL Intracatheter Dallis Dues, RN          Latest Ref Rng & Units 05/03/2021    1:55 PM 01/26/2016    3:31 PM  PFT Results  FVC-Pre L 2.19  2.31   FVC-Predicted Pre % 80  79   FVC-Post L 2.21  2.45   FVC-Predicted Post % 80  84   Pre FEV1/FVC % % 68  69   Post FEV1/FCV % % 68   69   FEV1-Pre L 1.49  1.60   FEV1-Predicted Pre % 72  72  FEV1-Post L 1.51  1.70   DLCO uncorrected ml/min/mmHg 10.27  12.99   DLCO UNC% % 55  56   DLCO corrected ml/min/mmHg 10.27    DLCO COR %Predicted % 55    DLVA Predicted % 72  70   TLC L 4.39  4.25   TLC % Predicted % 89  86   RV % Predicted % 100  84     No results found for: "NITRICOXIDE"      Assessment & Plan:   Stage 2 moderate COPD by GOLD classification (HCC) Stable and compensated on current regimen. Minimal symptom burden. Compliant with therapy. No recent exacerbations. Action plan in place. Provided with rx for albuterol  PRN   Patient Instructions  Continue Stiolto 2 puffs daily  Continue Albuterol  inhaler 2 puffs every 6 hours as needed for shortness of breath or wheezing. Notify if symptoms persist despite rescue inhaler/neb use.  Follow up with oncology as scheduled    Follow up in 6 months with Dr. Thelda Finney. If symptoms worsen, please contact office for sooner follow up or seek emergency care.    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 Has not had repeat imaging recent. Will order CT chest for monitoring.   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 See above. Follow up with oncology as scheduled   I spent 32 minutes of dedicated to the care of this patient on the date of this encounter to include pre-visit review of records, face-to-face time with the patient discussing conditions above, post visit ordering of testing, clinical documentation with the electronic health record, making appropriate referrals as documented, and communicating necessary findings to members of the patients care team.  Roetta Clarke, NP 04/23/2024  Pt aware and understands NP's role.

## 2024-04-23 NOTE — Assessment & Plan Note (Signed)
 Undergoing treatment with oncology. See above. Follow up with oncology as scheduled

## 2024-04-23 NOTE — Assessment & Plan Note (Signed)
 Felt to be metastases related to CML. Has not had repeat imaging recent. Will order CT chest for monitoring.

## 2024-04-23 NOTE — Patient Instructions (Addendum)
 Continue Stiolto 2 puffs daily  Continue Albuterol  inhaler 2 puffs every 6 hours as needed for shortness of breath or wheezing. Notify if symptoms persist despite rescue inhaler/neb use.  Follow up with oncology as scheduled    Follow up in 6 months with Dr. Thelda Finney. If symptoms worsen, please contact office for sooner follow up or seek emergency care.

## 2024-04-23 NOTE — Assessment & Plan Note (Signed)
 Stable and compensated on current regimen. Minimal symptom burden. Compliant with therapy. No recent exacerbations. Action plan in place. Provided with rx for albuterol  PRN   Patient Instructions  Continue Stiolto 2 puffs daily  Continue Albuterol  inhaler 2 puffs every 6 hours as needed for shortness of breath or wheezing. Notify if symptoms persist despite rescue inhaler/neb use.  Follow up with oncology as scheduled    Follow up in 6 months with Dr. Thelda Finney. If symptoms worsen, please contact office for sooner follow up or seek emergency care.

## 2024-04-24 DIAGNOSIS — Z79899 Other long term (current) drug therapy: Secondary | ICD-10-CM | POA: Diagnosis not present

## 2024-04-24 DIAGNOSIS — R5383 Other fatigue: Secondary | ICD-10-CM | POA: Diagnosis not present

## 2024-04-24 DIAGNOSIS — M0579 Rheumatoid arthritis with rheumatoid factor of multiple sites without organ or systems involvement: Secondary | ICD-10-CM | POA: Diagnosis not present

## 2024-04-24 DIAGNOSIS — Z111 Encounter for screening for respiratory tuberculosis: Secondary | ICD-10-CM | POA: Diagnosis not present

## 2024-05-01 ENCOUNTER — Other Ambulatory Visit: Payer: Self-pay

## 2024-05-01 ENCOUNTER — Other Ambulatory Visit: Payer: Self-pay | Admitting: Hematology

## 2024-05-01 ENCOUNTER — Telehealth (HOSPITAL_BASED_OUTPATIENT_CLINIC_OR_DEPARTMENT_OTHER): Payer: Self-pay

## 2024-05-01 DIAGNOSIS — C9310 Chronic myelomonocytic leukemia not having achieved remission: Secondary | ICD-10-CM

## 2024-05-01 NOTE — Telephone Encounter (Signed)
 Left pt message to return call to find out which rx she is referring to    Copied from CRM [id number]. Topic: Clinical - Prescription Issue >> May 01, 2024 10:34 AM Hilton Lucky wrote: Reason for CRM: Patient states she has been prescribed a prescription she is not using and she wants NP Cobb to call her back to discuss.

## 2024-05-05 ENCOUNTER — Inpatient Hospital Stay

## 2024-05-05 ENCOUNTER — Inpatient Hospital Stay: Admitting: Hematology

## 2024-05-05 NOTE — Telephone Encounter (Signed)
 ATC X1. LMTCB

## 2024-05-05 NOTE — Telephone Encounter (Signed)
 Copied from CRM [id number]. Topic: Clinical - Medical Advice >> May 05, 2024 11:02 AM Isabell A wrote: Reason for CRM: Patient returning phone call from [facility] - patient would like a MyChart message instead.  Sending Mychart

## 2024-05-06 ENCOUNTER — Ambulatory Visit

## 2024-05-07 ENCOUNTER — Ambulatory Visit

## 2024-05-08 ENCOUNTER — Ambulatory Visit

## 2024-05-09 ENCOUNTER — Ambulatory Visit

## 2024-05-11 ENCOUNTER — Other Ambulatory Visit: Payer: Self-pay | Admitting: Cardiovascular Disease

## 2024-05-11 NOTE — Assessment & Plan Note (Signed)
 diagnosed in 10/2021 ---She developed worsening back pain, pelvic MRI 07/04/21 showed a mass in the presacral space. 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  -She began azacitadine on 02/06/22. she has had multiple hospital admissions and her performance status has been very low.  Changed her chemo to every 6 weeks in 08/2022 due to her fatigue. -she is tolerating every 6 weeks much better -she saw Dr. Sharyne Richters in Feb 2024, who recommends to continue current therapy  -Due to worsening low back pain, she completed palliative radiation on 07/02/2023 -due to worsening low back pain, she was hospitalized on November 12, 2023.  Currently follow-up with palliative care for pain control.

## 2024-05-12 ENCOUNTER — Inpatient Hospital Stay: Attending: Nurse Practitioner

## 2024-05-12 ENCOUNTER — Ambulatory Visit
Admission: RE | Admit: 2024-05-12 | Discharge: 2024-05-12 | Disposition: A | Discharge status: Home / Self Care | Source: Ambulatory Visit | Attending: Nurse Practitioner | Admitting: Nurse Practitioner

## 2024-05-12 ENCOUNTER — Telehealth: Payer: Self-pay | Admitting: Cardiovascular Disease

## 2024-05-12 ENCOUNTER — Inpatient Hospital Stay

## 2024-05-12 ENCOUNTER — Encounter: Payer: Self-pay | Admitting: Hematology

## 2024-05-12 ENCOUNTER — Inpatient Hospital Stay (HOSPITAL_BASED_OUTPATIENT_CLINIC_OR_DEPARTMENT_OTHER): Admitting: Hematology

## 2024-05-12 VITALS — BP 110/60 | HR 80 | Temp 98.7°F | Resp 20 | Ht 61.0 in | Wt 105.5 lb

## 2024-05-12 DIAGNOSIS — C9310 Chronic myelomonocytic leukemia not having achieved remission: Secondary | ICD-10-CM | POA: Diagnosis not present

## 2024-05-12 DIAGNOSIS — Z5111 Encounter for antineoplastic chemotherapy: Secondary | ICD-10-CM | POA: Diagnosis not present

## 2024-05-12 DIAGNOSIS — J439 Emphysema, unspecified: Secondary | ICD-10-CM | POA: Diagnosis not present

## 2024-05-12 DIAGNOSIS — C7800 Secondary malignant neoplasm of unspecified lung: Secondary | ICD-10-CM

## 2024-05-12 DIAGNOSIS — C9210 Chronic myeloid leukemia, BCR/ABL-positive, not having achieved remission: Secondary | ICD-10-CM

## 2024-05-12 DIAGNOSIS — Z79899 Other long term (current) drug therapy: Secondary | ICD-10-CM | POA: Insufficient documentation

## 2024-05-12 DIAGNOSIS — Z95828 Presence of other vascular implants and grafts: Secondary | ICD-10-CM

## 2024-05-12 DIAGNOSIS — R918 Other nonspecific abnormal finding of lung field: Secondary | ICD-10-CM | POA: Diagnosis not present

## 2024-05-12 DIAGNOSIS — R599 Enlarged lymph nodes, unspecified: Secondary | ICD-10-CM | POA: Diagnosis not present

## 2024-05-12 DIAGNOSIS — I517 Cardiomegaly: Secondary | ICD-10-CM | POA: Diagnosis not present

## 2024-05-12 LAB — BASIC METABOLIC PANEL - CANCER CENTER ONLY
Anion gap: 9 (ref 5–15)
BUN: 28 mg/dL — ABNORMAL HIGH (ref 8–23)
CO2: 28 mmol/L (ref 22–32)
Calcium: 9.6 mg/dL (ref 8.9–10.3)
Chloride: 100 mmol/L (ref 98–111)
Creatinine: 1.13 mg/dL — ABNORMAL HIGH (ref 0.44–1.00)
GFR, Estimated: 50 mL/min — ABNORMAL LOW (ref >60)
Glucose, Bld: 124 mg/dL — ABNORMAL HIGH (ref 70–99)
Potassium: 4.5 mmol/L (ref 3.5–5.1)
Sodium: 137 mmol/L (ref 135–145)

## 2024-05-12 LAB — CBC WITH DIFFERENTIAL (CANCER CENTER ONLY)
Abs Immature Granulocytes: 1.87 10*3/uL — ABNORMAL HIGH (ref 0.00–0.07)
Basophils Absolute: 0.5 10*3/uL — ABNORMAL HIGH (ref 0.0–0.1)
Basophils Relative: 2 %
Eosinophils Absolute: 0.6 10*3/uL — ABNORMAL HIGH (ref 0.0–0.5)
Eosinophils Relative: 3 %
HCT: 33 % — ABNORMAL LOW (ref 36.0–46.0)
Hemoglobin: 10.7 g/dL — ABNORMAL LOW (ref 12.0–15.0)
Immature Granulocytes: 8 %
Lymphocytes Relative: 8 %
Lymphs Abs: 1.8 10*3/uL (ref 0.7–4.0)
MCH: 32.8 pg (ref 26.0–34.0)
MCHC: 32.4 g/dL (ref 30.0–36.0)
MCV: 101.2 fL — ABNORMAL HIGH (ref 80.0–100.0)
Monocytes Absolute: 5.4 10*3/uL — ABNORMAL HIGH (ref 0.1–1.0)
Monocytes Relative: 24 %
Neutro Abs: 12.2 10*3/uL — ABNORMAL HIGH (ref 1.7–7.7)
Neutrophils Relative %: 55 %
Platelet Count: 216 10*3/uL (ref 150–400)
RBC: 3.26 MIL/uL — ABNORMAL LOW (ref 3.87–5.11)
RDW: 14.9 % (ref 11.5–15.5)
Smear Review: NORMAL
WBC Count: 22.3 10*3/uL — ABNORMAL HIGH (ref 4.0–10.5)
nRBC: 0 % (ref 0.0–0.2)

## 2024-05-12 MED ORDER — SODIUM CHLORIDE 0.9% FLUSH
10.0000 mL | Freq: Once | INTRAVENOUS | Status: AC
  Administered 2024-05-12: 10 mL

## 2024-05-12 MED ORDER — SODIUM CHLORIDE 0.9 % IV SOLN
75.0000 mg/m2 | Freq: Once | INTRAVENOUS | Status: AC
  Administered 2024-05-12: 114 mg via INTRAVENOUS
  Filled 2024-05-12: qty 11.4

## 2024-05-12 MED ORDER — METOPROLOL TARTRATE 75 MG PO TABS: 150.0000 mg | ORAL_TABLET | Freq: Two times a day (BID) | ORAL | 1 refills | Status: DC

## 2024-05-12 MED ORDER — ONDANSETRON HCL 4 MG/2ML IJ SOLN
8.0000 mg | Freq: Once | INTRAMUSCULAR | Status: AC
  Administered 2024-05-12: 8 mg via INTRAVENOUS
  Filled 2024-05-12: qty 4

## 2024-05-12 MED ORDER — SODIUM CHLORIDE 0.9 % IV SOLN: Freq: Once | INTRAVENOUS | Status: AC

## 2024-05-12 NOTE — Telephone Encounter (Signed)
 Pt's medication was sent to pt's pharmacy as requested. Confirmation received.

## 2024-05-12 NOTE — Telephone Encounter (Signed)
 *  STAT* If patient is at the pharmacy, call can be transferred to refill team.   1. Which medications need to be refilled? (please list name of each medication and dose if known)   Metoprolol  Tartrate 75 MG TABS     2. Would you like to learn more about the convenience, safety, & potential cost savings by using the [facility]? No      3. Are you open to using the [facility] (Type [facility]. ). No    4. Which pharmacy/location (including street and city if local pharmacy) is medication to be sent to? [id number] - [place], [place] - [address] AT [facility] OF [address]     5. Do they need a 30 day or 90 day supply? 90 days

## 2024-05-12 NOTE — Progress Notes (Signed)
 [facility]   [phone] [phone]   Clinic Follow up Note   Patient Care Team: Elester Grim, MD as PCP - General (Internal Medicine) [name], Lela Purple, MD as PCP - Cardiology (Cardiology) Stefan Edge, MD as Consulting Physician (Rheumatology) Sonja Gresham, MD as Consulting Physician (Hematology) Tami Falcon, MD as Consulting Physician (Gastroenterology) Icard, Lucie Ruts, DO (Inactive) as Consulting Physician (Pulmonary Disease) Pickenpack-Cousar, Giles Labrum, NP as Nurse Practitioner [facility] and Palliative Medicine)  Date of Service:  05/12/2024  CHIEF COMPLAINT: f/u of CMML  CURRENT THERAPY:  Azacitidine  day 1-5 every 42 days  Oncology History   CMML (chronic myelomonocytic leukemia) (HCC) diagnosed in 10/2021 ---She developed worsening back pain, pelvic MRI 07/04/21 showed a mass in the presacral space. 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 Her BM biopsy was reviewed at [facility] and was felt to be CMML-1 with 5% blasts.  -She began azacitadine on 02/06/22. she has had multiple hospital admissions and her performance status has been very low.  Changed her chemo to every 6 weeks in 08/2022 due to her fatigue. -she is tolerating every 6 weeks much better -she saw Dr. Vira Grieves in Feb 2024, who recommends to continue current therapy  -Due to worsening low back pain, she completed palliative radiation on 07/02/2023 -due to worsening low back pain, she was hospitalized on November 12, 2023.  Currently follow-up with palliative care for pain control.   Assessment & Plan Chronic myelomonocytic leukemia (CMML) Chronic myelomonocytic leukemia with a slowly increasing white blood cell count at 22,000, predominantly neutrophils and monocytes. Hemoglobin is 10.7, and platelet count is  normal. She experiences variable fatigue post-treatment. Current treatment involves infusions every six weeks. The differential for the elevated white count is pending; if immature or leukemia cells are present, a bone marrow biopsy may be considered. The infusion aims to control the disease, but further intervention may be necessary if concerning cells are found. - Continue infusions every six weeks. - Monitor white blood cell differential for immature or leukemia cells. - Consider bone marrow biopsy if concerning cells are present in the differential.  Low back pain Chronic low back pain managed with daily acetaminophen , typically once a day. The pain is persistent but not severe enough to require additional intervention. - Continue current management with acetaminophen  as needed.  Plan - Lab reviewed, adequate for treatment, will proceed azacitidine  today and next 4 days - Follow-up in 6 weeks before next cycle of treatment   SUMMARY OF ONCOLOGIC HISTORY: Oncology History Overview Note   Cancer Staging  CMML (chronic myelomonocytic leukemia) (HCC) Staging form: Chronic Myeloid Leukemia, AJCC 8th Edition - Clinical stage from 01/12/2022: Bone marrow blast count (%): 12, Additional clonal changes: Unknown - Signed by Sonja Hope, MD on 01/12/2022 Stage prefix: Initial diagnosis     CMML (chronic myelomonocytic leukemia) (HCC)  11/09/2021 Initial Biopsy   DIAGNOSIS:   -  Monoclonal B-cell population with co-expression of CD5 comprises 17%  of all lymphocytes  -  See comment   COMMENT:  In addition to the clonal B-cell population, there is a myeloblast  population (CD34, CD38, HLA-DR, CD117, CD123 and CD33) that comprises 2% of the total cellular events.  Please see concurrent tissue biopsy (below) for additional work-up and final diagnosis.    FINAL MICROSCOPIC DIAGNOSIS:   A. SOFT TISSUE MASS, PRE SACRAL, NEEDLE CORE BIOPSY:  -  Chronic lymphocytic leukemia/small lymphocytic  lymphoma  -  Extra medullary hematopoiesis  -  See comment   COMMENT:  The biopsy consists of multiple soft tissue cores with lymphoid nodules and a dense hematopoietic infiltrate consistent with extra medullary hematopoiesis.  MPO and E-cadherin highlight myeloid and erythroid precursors respectively.  CD34 highlights increased vasculature and is also positive within the cytoplasm of megakaryocytes.  A few small, immature mononuclear cells appear to be positive for CD34 and CD117. TdT shows rare, scattered positive cells.  CD20 highlights aggregates of B cells which are admixed with CD3 positive T cells.  T cells are an admixture of CD4 and CD8.  The B cells are also positive for CD5, CD23 and Bcl-2.  The B cells do not show significant staining for CD10, BCL6 or cyclin D1.  CD138 highlights scattered plasma cells which are polytypic by kappa and lambda in situ hybridization.  Flow cytometry performed on the sample (see WL [id number]) identified a kappa restricted CD5 positive B-cell population comprising 70% of lymphocytes.  In addition, a small myeloblast population comprised 2% of the total cellular events.   Overall, the findings are consistent with soft tissue involvement by  chronic lymphocytic leukemia/small lymphocytic lymphoma and extra medullary hematopoiesis. In reviewing the patient's CBC data (macrocytic anemia and thrombocytopenia), I would recommend a bone marrow biopsy to assess for marrow involvement by CLL/SLL.    12/23/2021 Imaging   EXAM: CT CHEST, ABDOMEN, AND PELVIS WITH CONTRAST  IMPRESSION: 1. Slight interval enlargement of a presacral soft tissue mass measuring 7.2 x 4.7 cm, previously 6.9 x 4.1 cm on prior MR of the pelvis dated 07/04/2021. By report, this represents a biopsy proven lymphoma. 2. Pleural nodule of the dependent right lower lobe overlying the posterior right tenth rib and pleural or paraspinous soft tissue mass overlying the right aspect of the T10  vertebral body, very slightly enlarged compared to prior examination of the chest dated 06/25/2020, consistent with additional sites of lymphomatous involvement given very indolent growth. These could be better assessed for metabolic activity by FDG PET/CT if desired. 3. There is mild, bibasilar predominant pulmonary fibrosis in a pattern featuring irregular peripheral interstitial opacity, septal thickening, but without clear evidence of subpleural bronchiolectasis or honeycombing, with a somewhat asymmetric distribution most conspicuously involving the right lower lobe and lingula. These findings are significantly worsened when compared to prior examination dated 06/25/2020, particularly in the right lower lobe. Given interval change, this may reflect sequelae of interval infection or aspiration, however appearance is generally suspicious for fibrotic interstitial lung disease, and if characterized by ATS pulmonary fibrosis is in an "indeterminate for UIP" pattern, differential considerations including both UIP and NSIP. 4. Emphysema.   Aortic Atherosclerosis (ICD10-I70.0) and Emphysema (ICD10-J43.9).   01/05/2022 Pathology Results   DIAGNOSIS:   BONE MARROW, ASPIRATE, CLOT, CORE:  -Hypercellular bone marrow for age with features of  myelodysplastic/myeloproliferative neoplasm  -Minor abnormal B-cell population  -See comment   PERIPHERAL BLOOD:  -Macrocytic anemia  -Neutrophilic left shift and monocytosis  -Thrombocytopenia   COMMENT:  The bone marrow is hypercellular for age with dyspoietic changes  involving myeloid cell lines associated with monocytosis and increased number of blastic cells (12%) as primarily seen by morphology, many of which display monocytic features.  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  In this background, there are several predominantly small lymphoid aggregates mostly composed of  small lymphoid cells.  By flow cytometry, a minor abnormal B-cell population expressing CD5 is seen and representing 2% of all cells.  This correlate with previously known B-cell lymphoproliferative process.  Correlation with cytogenetic and FISH studies is strongly recommended.    DIAGNOSIS:   -Increased number of monocytic cells present (25%)  -Minor abnormal B-cell population identified.  -See comment   COMMENT:  Flow cytometric analysis shows increased number of monocytic cells representing 25% of all cells but without aberrant phenotype or CD34 expression.  A significant CD34-positive blastic population is not identified.  The lymphoid population shows a minor B-cell population representing 2% of all cells and expressing B-cell antigens including CD20 associated with CD5, CD200 and possibly dim kappa expression.  The latter findings are abnormal and correlate with previously known B-cell lymphoproliferative process.  No significant T-cell phenotypic abnormalities identified.    01/12/2022 Initial Diagnosis   CMML (chronic myelomonocytic leukemia) (HCC)   01/12/2022 Cancer Staging   Staging form: Chronic Myeloid Leukemia, AJCC 8th Edition - Clinical stage from 01/12/2022: Bone marrow blast count (%): 12, Additional clonal changes: Unknown - Signed by Sonja [place], MD on 01/12/2022 Stage prefix: Initial diagnosis   02/06/2022 - 08/11/2022 Chemotherapy   Patient is on Treatment Plan : MYELODYSPLASIA  Azacitidine  IV D1-7 q28d     02/06/2022 -  Chemotherapy   Patient is on Treatment Plan : MYELODYSPLASIA  Azacitidine  IV D1-7 q28d     06/03/2023 Imaging   MR lumbar spine with and without contrast IMPRESSION: 1. Long-standing presacral mass, at least doubled in size since 2016, now at least 8 cm in length. There is internal fat components and differential considerations include myelolipoma,  liposarcoma, and teratoma. 2. 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 3. L2-3 left paracentral extrusion compressing the left L3 nerve root. 4. Right foraminal impingement at L3-4 to L4-5, greatest at L4-5. Right subarticular recess narrowing at L3-4 and L4-5.   07/09/2023 Imaging   CT abdomen and pelvis with contrast  IMPRESSION: No acute findings in the abdomen or pelvis. Specifically, no findings to explain the patient's history of pain. 2. Interval progression of presacral and right thoracic paraspinal soft tissue lesions compatible with mild progression of disease since 06/19/2022. Small posterior right thoracic chest wall lesion also mildly progressive. 3. Mild circumferential wall thickening distal esophagus. Esophagitis would be a consideration. 4.  Aortic Atherosclerosis (ICD10-I70.0).      Discussed the use of AI scribe software for clinical note transcription with the patient, who gave verbal consent to proceed.  History of Present Illness [name] is a 78 year old female with chronic myelomonocytic leukemia (CMML) who presents for follow-up.  Her white blood cell count is 22, hemoglobin is 10.7, and platelet count is normal. She has not required a blood transfusion. Fatigue persists following her last treatment six weeks ago, with daily variations in tiredness.  Persistent lower back and shoulder pain is managed with daily Tylenol . There have been no recent falls, and she remains independent in daily activities.  Her weight has decreased from 108 pounds in early April to 105 pounds currently. She hydrates with water and supplements her nutrition with protein shakes and Boost. A family member is concerned about her nutritional intake.  She visited the emergency room on April 11 for left ankle swelling, with no fractures identified. The swelling has mostly resolved, with mild persistent swelling in her ankles. No recent trauma or  bleeding is reported. No fever, infection, stomach pain,  or significant leg swelling beyond mild ankle swelling is present.     All other systems were reviewed with the patient and are negative.  MEDICAL HISTORY:  Past Medical History:  Diagnosis Date   Arthritis    Rheumatoid arthritis   Celiac disease    Chronic kidney disease    stage 3 from MD notes   COPD (chronic obstructive pulmonary disease) (HCC)    Dyspnea    with going up stairs   Family history of adverse reaction to anesthesia    father had hard time waking up   Headache    sinus headaches   Hot flashes    Hypertension    Iron deficiency anemia    Pneumonia    per patient "I have walking pneumonia"    SURGICAL HISTORY: Past Surgical History:  Procedure Laterality Date   COLONOSCOPY     ECTOPIC PREGNANCY SURGERY      x 2   IR IMAGING GUIDED PORT INSERTION  01/31/2022   REVERSE SHOULDER ARTHROPLASTY Right 02/02/2017   Procedure: RIGHT REVERSE SHOULDER ARTHROPLASTY;  Surgeon: Winston Hawking, MD;  Location: [facility] OR;  Service: Orthopedics;  Laterality: Right;    I have reviewed the social history and family history with the patient and they are unchanged from previous note.  ALLERGIES:  is allergic to digoxin  and related, gluten meal, penicillins, fexofenadine, alendronate, and hydroxychloroquine.  MEDICATIONS:  Current Outpatient Medications  Medication Sig Dispense Refill   acetaminophen  (TYLENOL ) 500 MG tablet Take 2 tablets (1,000 mg total) by mouth 3 (three) times daily. 30 tablet 0   albuterol  (VENTOLIN  HFA) 108 (90 Base) MCG/ACT inhaler Inhale 2 puffs into the lungs every 6 (six) hours as needed for wheezing or shortness of breath. 8 g 2   apixaban  (ELIQUIS ) 5 MG TABS tablet Take 1 tablet (5 mg total) by mouth 2 (two) times daily. 60 tablet 11   azaCITIDine  (VIDAZA ) 100 MG SUSR 114mg  Injection every 6 weeks     denosumab  (PROLIA ) 60 MG/ML SOSY injection Inject 60 mg into the skin every 6 (six) months.      diltiazem  (CARDIZEM  CD) 360 MG 24 hr capsule Take 1 capsule (360 mg total) by mouth daily. 90 capsule 3   gabapentin  (NEURONTIN ) 300 MG capsule Take 300 mg by mouth in the morning.     Metoprolol  Tartrate 75 MG TABS Take 2 tablets (150 mg total) by mouth 2 (two) times daily. 360 tablet 1   predniSONE  (DELTASONE ) 5 MG tablet Take 5 mg by mouth daily with breakfast.     Tiotropium Bromide-Olodaterol (STIOLTO RESPIMAT ) 2.5-2.5 MCG/ACT AERS Inhale 2 puffs into the lungs daily. 4 g 11   furosemide  (LASIX ) 20 MG tablet Take 1 tablet (20 mg total) by mouth daily. 30 tablet 11   No current facility-administered medications for this visit.   Facility-Administered Medications Ordered in Other Visits  Medication Dose Route Frequency Provider Last Rate Last Admin   acetaminophen  (TYLENOL ) 325 MG tablet            diphenhydrAMINE  (BENADRYL ) 25 mg capsule             PHYSICAL EXAMINATION: ECOG PERFORMANCE STATUS: 2 - Symptomatic, <50% confined to bed  Vitals:   05/12/24 1312  BP: 110/60  Pulse: 80  Resp: 20  Temp: 98.7 F (37.1 C)  SpO2: 97%   Wt Readings from Last 3 Encounters:  05/12/24 105 lb 8 oz (47.9 kg)  04/23/24 107 lb 3.2 oz (48.6 kg)  04/04/24  108 lb 2.9 oz (49.1 kg)     GENERAL:alert, no distress and comfortable, thin elderly lady SKIN: skin color, texture, turgor are normal, no rashes or significant lesions except diffuse skin hyperpigmentation in her arms EYES: normal, Conjunctiva are pink and non-injected, sclera clear NECK: supple, thyroid  normal size, non-tender, without nodularity LYMPH:  no palpable lymphadenopathy in the cervical, axillary  LUNGS: clear to auscultation and percussion with normal breathing effort HEART: regular rate & rhythm and no murmurs and no lower extremity edema ABDOMEN:abdomen soft, non-tender and normal bowel sounds Musculoskeletal:no cyanosis of digits and no clubbing  NEURO: alert & oriented x 3 with fluent speech, no focal motor/sensory  deficits  Physical Exam   LABORATORY DATA:  I have reviewed the data as listed    Latest Ref Rng & Units 05/12/2024   12:51 PM 04/04/2024    9:03 AM 03/31/2024    1:11 PM  CBC  WBC 4.0 - 10.5 K/uL 22.3  18.0  19.9   Hemoglobin 12.0 - 15.0 g/dL 03.4  9.8  74.2   Hematocrit 36.0 - 46.0 % 33.0  31.6  31.7   Platelets 150 - 400 K/uL 216  184  222         Latest Ref Rng & Units 05/12/2024   12:51 PM 04/04/2024    9:03 AM 03/31/2024    1:11 PM  CMP  Glucose 70 - 99 mg/dL 595  638  756   BUN 8 - 23 mg/dL 28  28  28    Creatinine 0.44 - 1.00 mg/dL 4.33  2.95  1.88   Sodium 135 - 145 mmol/L 137  133  136   Potassium 3.5 - 5.1 mmol/L 4.5  3.7  4.1   Chloride 98 - 111 mmol/L 100  100  100   CO2 22 - 32 mmol/L 28  24  28    Calcium  8.9 - 10.3 mg/dL 9.6  8.8  9.5   Total Protein 6.5 - 8.1 g/dL  6.4    Total Bilirubin 0.0 - 1.2 mg/dL  1.1    Alkaline Phos 38 - 126 U/L  40    AST 15 - 41 U/L  20    ALT 0 - 44 U/L  14        RADIOGRAPHIC STUDIES: I have personally reviewed the radiological images as listed and agreed with the findings in the report. No results found.    Orders Placed This Encounter  Procedures   CBC with Differential (Cancer Center Only)    Standing Status:   Future    Expected Date:   08/04/2024    Expiration Date:   08/04/2025   Basic Metabolic Panel - Cancer Center Only    Standing Status:   Future    Expected Date:   08/04/2024    Expiration Date:   08/04/2025   All questions were answered. The patient knows to call the clinic with any problems, questions or concerns. No barriers to learning was detected. The total time spent in the appointment was 25 minutes, including review of chart and various tests results, discussions about plan of care and coordination of care plan     Sonja Paris, MD 05/12/2024

## 2024-05-12 NOTE — Patient Instructions (Signed)
 [facility] - A DEPT OF [facility]  Discharge Instructions: Thank you for choosing [facility] to provide your oncology and hematology care.   If you have a lab appointment with the Cancer Center, please go directly to the Cancer Center and check in at the registration area.   Wear comfortable clothing and clothing appropriate for easy access to any Portacath or PICC line.   We strive to give you quality time with your provider. You may need to reschedule your appointment if you arrive late (15 or more minutes).  Arriving late affects you and other patients whose appointments are after yours.  Also, if you miss three or more appointments without notifying the office, you may be dismissed from the clinic at the provider's discretion.      For prescription refill requests, have your pharmacy contact our office and allow 72 hours for refills to be completed.    Today you received the following chemotherapy and/or immunotherapy agents vidaza       To help prevent nausea and vomiting after your treatment, we encourage you to take your nausea medication as directed.  BELOW ARE SYMPTOMS THAT SHOULD BE REPORTED IMMEDIATELY: *FEVER GREATER THAN 100.4 F (38 C) OR HIGHER *CHILLS OR SWEATING *NAUSEA AND VOMITING THAT IS NOT CONTROLLED WITH YOUR NAUSEA MEDICATION *UNUSUAL SHORTNESS OF BREATH *UNUSUAL BRUISING OR BLEEDING *URINARY PROBLEMS (pain or burning when urinating, or frequent urination) *BOWEL PROBLEMS (unusual diarrhea, constipation, pain near the anus) TENDERNESS IN MOUTH AND THROAT WITH OR WITHOUT PRESENCE OF ULCERS (sore throat, sores in mouth, or a toothache) UNUSUAL RASH, SWELLING OR PAIN  UNUSUAL VAGINAL DISCHARGE OR ITCHING   Items with * indicate a potential emergency and should be followed up as soon as possible or go to the Emergency Department if any problems should occur.  Please show the CHEMOTHERAPY ALERT CARD or IMMUNOTHERAPY  ALERT CARD at check-in to the Emergency Department and triage nurse.  Should you have questions after your visit or need to cancel or reschedule your appointment, please contact [facility] - A DEPT OF [facility]  Dept: [phone]  and follow the prompts.  Office hours are 8:00 a.m. to 4:30 p.m. Monday - Friday. Please note that voicemails left after 4:00 p.m. may not be returned until the following business day.  We are closed weekends and major holidays. You have access to a nurse at all times for urgent questions. Please call the main number to the clinic Dept: [phone] and follow the prompts.   For any non-urgent questions, you may also contact your provider using MyChart. We now offer e-Visits for anyone 28 and older to request care online for non-urgent symptoms. For details visit [email or web address].   Also download the MyChart app! Go to the app store, search "MyChart", open the app, select [place], and log in with your MyChart username and password.

## 2024-05-13 ENCOUNTER — Telehealth: Payer: Self-pay | Admitting: Nurse Practitioner

## 2024-05-13 ENCOUNTER — Inpatient Hospital Stay

## 2024-05-13 VITALS — BP 111/67 | HR 66 | Temp 98.2°F | Resp 20

## 2024-05-13 DIAGNOSIS — Z79899 Other long term (current) drug therapy: Secondary | ICD-10-CM | POA: Diagnosis not present

## 2024-05-13 DIAGNOSIS — C9310 Chronic myelomonocytic leukemia not having achieved remission: Secondary | ICD-10-CM | POA: Diagnosis not present

## 2024-05-13 DIAGNOSIS — Z5111 Encounter for antineoplastic chemotherapy: Secondary | ICD-10-CM | POA: Diagnosis not present

## 2024-05-13 MED ORDER — ONDANSETRON HCL 4 MG/2ML IJ SOLN
8.0000 mg | Freq: Once | INTRAMUSCULAR | Status: AC
  Administered 2024-05-13: 8 mg via INTRAVENOUS
  Filled 2024-05-13: qty 4

## 2024-05-13 MED ORDER — HEPARIN SOD (PORK) LOCK FLUSH 100 UNIT/ML IV SOLN
500.0000 [IU] | Freq: Once | INTRAVENOUS | Status: AC | PRN
  Administered 2024-05-13: 500 [IU]

## 2024-05-13 MED ORDER — SODIUM CHLORIDE 0.9% FLUSH
10.0000 mL | INTRAVENOUS | Status: DC | PRN
  Administered 2024-05-13: 10 mL

## 2024-05-13 MED ORDER — SODIUM CHLORIDE 0.9 % IV SOLN
75.0000 mg/m2 | Freq: Once | INTRAVENOUS | Status: AC
  Administered 2024-05-13: 114 mg via INTRAVENOUS
  Filled 2024-05-13: qty 11.4

## 2024-05-13 MED ORDER — SODIUM CHLORIDE 0.9 % IV SOLN: Freq: Once | INTRAVENOUS | Status: AC

## 2024-05-13 NOTE — Telephone Encounter (Signed)
 Spoke with patient while she was getting infusion. She does have a few fluid filled blisters on the tips of her fingers and palms of her hands. They are non tender. There is no surrounding redness, warmth, or evidence of infection. Recommended she apply neosporin antibiotic ointment to blisters 2 to 3 times per day. She should advise us  if they start to get more severe, red, with pain, and drainage of infected appearing fluids. Advised that blisters may be caused from her treatment with Vidasa in combination with peeling many crawfish a few weeks ago. She voiced understanding of the instructions and stated that she would be here all week and would definitely keep us  up to date with symptoms.  Rande Bushy, NP

## 2024-05-13 NOTE — Patient Instructions (Signed)
 [facility] - A DEPT OF [facility]  Discharge Instructions: Thank you for choosing [facility] to provide your oncology and hematology care.   If you have a lab appointment with the Cancer Center, please go directly to the Cancer Center and check in at the registration area.   Wear comfortable clothing and clothing appropriate for easy access to any Portacath or PICC line.   We strive to give you quality time with your provider. You may need to reschedule your appointment if you arrive late (15 or more minutes).  Arriving late affects you and other patients whose appointments are after yours.  Also, if you miss three or more appointments without notifying the office, you may be dismissed from the clinic at the provider's discretion.      For prescription refill requests, have your pharmacy contact our office and allow 72 hours for refills to be completed.    Today you received the following chemotherapy and/or immunotherapy agents: Vidaza      To help prevent nausea and vomiting after your treatment, we encourage you to take your nausea medication as directed.  BELOW ARE SYMPTOMS THAT SHOULD BE REPORTED IMMEDIATELY: *FEVER GREATER THAN 100.4 F (38 C) OR HIGHER *CHILLS OR SWEATING *NAUSEA AND VOMITING THAT IS NOT CONTROLLED WITH YOUR NAUSEA MEDICATION *UNUSUAL SHORTNESS OF BREATH *UNUSUAL BRUISING OR BLEEDING *URINARY PROBLEMS (pain or burning when urinating, or frequent urination) *BOWEL PROBLEMS (unusual diarrhea, constipation, pain near the anus) TENDERNESS IN MOUTH AND THROAT WITH OR WITHOUT PRESENCE OF ULCERS (sore throat, sores in mouth, or a toothache) UNUSUAL RASH, SWELLING OR PAIN  UNUSUAL VAGINAL DISCHARGE OR ITCHING   Items with * indicate a potential emergency and should be followed up as soon as possible or go to the Emergency Department if any problems should occur.  Please show the CHEMOTHERAPY ALERT CARD or IMMUNOTHERAPY  ALERT CARD at check-in to the Emergency Department and triage nurse.  Should you have questions after your visit or need to cancel or reschedule your appointment, please contact [facility] - A DEPT OF [facility]  Dept: [phone]  and follow the prompts.  Office hours are 8:00 a.m. to 4:30 p.m. Monday - Friday. Please note that voicemails left after 4:00 p.m. may not be returned until the following business day.  We are closed weekends and major holidays. You have access to a nurse at all times for urgent questions. Please call the main number to the clinic Dept: [phone] and follow the prompts.   For any non-urgent questions, you may also contact your provider using MyChart. We now offer e-Visits for anyone 5 and older to request care online for non-urgent symptoms. For details visit [email or web address].   Also download the MyChart app! Go to the app store, search "MyChart", open the app, select [facility], and log in with your MyChart username and password.

## 2024-05-14 ENCOUNTER — Inpatient Hospital Stay

## 2024-05-14 VITALS — BP 109/65 | HR 85 | Resp 17

## 2024-05-14 DIAGNOSIS — C9310 Chronic myelomonocytic leukemia not having achieved remission: Secondary | ICD-10-CM | POA: Diagnosis not present

## 2024-05-14 DIAGNOSIS — Z5111 Encounter for antineoplastic chemotherapy: Secondary | ICD-10-CM | POA: Diagnosis not present

## 2024-05-14 DIAGNOSIS — Z79899 Other long term (current) drug therapy: Secondary | ICD-10-CM | POA: Diagnosis not present

## 2024-05-14 MED ORDER — HEPARIN SOD (PORK) LOCK FLUSH 100 UNIT/ML IV SOLN
500.0000 [IU] | Freq: Once | INTRAVENOUS | Status: AC | PRN
Start: 2024-05-14 — End: 2024-05-14
  Administered 2024-05-14: 500 [IU]

## 2024-05-14 MED ORDER — SODIUM CHLORIDE 0.9 % IV SOLN
75.0000 mg/m2 | Freq: Once | INTRAVENOUS | Status: AC
  Administered 2024-05-14: 114 mg via INTRAVENOUS
  Filled 2024-05-14: qty 11.4

## 2024-05-14 MED ORDER — SODIUM CHLORIDE 0.9 % IV SOLN: Freq: Once | INTRAVENOUS | Status: AC

## 2024-05-14 MED ORDER — ONDANSETRON HCL 4 MG/2ML IJ SOLN
8.0000 mg | Freq: Once | INTRAMUSCULAR | Status: AC
  Administered 2024-05-14: 8 mg via INTRAVENOUS
  Filled 2024-05-14: qty 4

## 2024-05-14 MED ORDER — SODIUM CHLORIDE 0.9% FLUSH
10.0000 mL | INTRAVENOUS | Status: DC | PRN
  Administered 2024-05-14: 10 mL

## 2024-05-14 NOTE — Patient Instructions (Signed)
 [facility] - A DEPT OF [facility]  Discharge Instructions: Thank you for choosing [facility] to provide your oncology and hematology care.   If you have a lab appointment with the Cancer Center, please go directly to the Cancer Center and check in at the registration area.   Wear comfortable clothing and clothing appropriate for easy access to any Portacath or PICC line.   We strive to give you quality time with your provider. You may need to reschedule your appointment if you arrive late (15 or more minutes).  Arriving late affects you and other patients whose appointments are after yours.  Also, if you miss three or more appointments without notifying the office, you may be dismissed from the clinic at the provider's discretion.      For prescription refill requests, have your pharmacy contact our office and allow 72 hours for refills to be completed.    Today you received the following chemotherapy and/or immunotherapy agents: Vidaza      To help prevent nausea and vomiting after your treatment, we encourage you to take your nausea medication as directed.  BELOW ARE SYMPTOMS THAT SHOULD BE REPORTED IMMEDIATELY: *FEVER GREATER THAN 100.4 F (38 C) OR HIGHER *CHILLS OR SWEATING *NAUSEA AND VOMITING THAT IS NOT CONTROLLED WITH YOUR NAUSEA MEDICATION *UNUSUAL SHORTNESS OF BREATH *UNUSUAL BRUISING OR BLEEDING *URINARY PROBLEMS (pain or burning when urinating, or frequent urination) *BOWEL PROBLEMS (unusual diarrhea, constipation, pain near the anus) TENDERNESS IN MOUTH AND THROAT WITH OR WITHOUT PRESENCE OF ULCERS (sore throat, sores in mouth, or a toothache) UNUSUAL RASH, SWELLING OR PAIN  UNUSUAL VAGINAL DISCHARGE OR ITCHING   Items with * indicate a potential emergency and should be followed up as soon as possible or go to the Emergency Department if any problems should occur.  Please show the CHEMOTHERAPY ALERT CARD or IMMUNOTHERAPY  ALERT CARD at check-in to the Emergency Department and triage nurse.  Should you have questions after your visit or need to cancel or reschedule your appointment, please contact [facility] - A DEPT OF [facility]  Dept: [phone]  and follow the prompts.  Office hours are 8:00 a.m. to 4:30 p.m. Monday - Friday. Please note that voicemails left after 4:00 p.m. may not be returned until the following business day.  We are closed weekends and major holidays. You have access to a nurse at all times for urgent questions. Please call the main number to the clinic Dept: [phone] and follow the prompts.   For any non-urgent questions, you may also contact your provider using MyChart. We now offer e-Visits for anyone 5 and older to request care online for non-urgent symptoms. For details visit [email or web address].   Also download the MyChart app! Go to the app store, search "MyChart", open the app, select [facility], and log in with your MyChart username and password.

## 2024-05-15 ENCOUNTER — Inpatient Hospital Stay

## 2024-05-15 VITALS — BP 102/70 | HR 85 | Temp 97.8°F | Resp 17

## 2024-05-15 DIAGNOSIS — Z5111 Encounter for antineoplastic chemotherapy: Secondary | ICD-10-CM | POA: Diagnosis not present

## 2024-05-15 DIAGNOSIS — C9310 Chronic myelomonocytic leukemia not having achieved remission: Secondary | ICD-10-CM

## 2024-05-15 DIAGNOSIS — Z79899 Other long term (current) drug therapy: Secondary | ICD-10-CM | POA: Diagnosis not present

## 2024-05-15 MED ORDER — ONDANSETRON HCL 4 MG/2ML IJ SOLN
8.0000 mg | Freq: Once | INTRAMUSCULAR | Status: AC
  Administered 2024-05-15: 8 mg via INTRAVENOUS
  Filled 2024-05-15: qty 4

## 2024-05-15 MED ORDER — SODIUM CHLORIDE 0.9 % IV SOLN
75.0000 mg/m2 | Freq: Once | INTRAVENOUS | Status: AC
  Administered 2024-05-15: 114 mg via INTRAVENOUS
  Filled 2024-05-15: qty 11.4

## 2024-05-15 MED ORDER — HEPARIN SOD (PORK) LOCK FLUSH 100 UNIT/ML IV SOLN
500.0000 [IU] | Freq: Once | INTRAVENOUS | Status: AC | PRN
  Administered 2024-05-15: 500 [IU]

## 2024-05-15 MED ORDER — SODIUM CHLORIDE 0.9 % IV SOLN: Freq: Once | INTRAVENOUS | Status: AC

## 2024-05-15 MED ORDER — SODIUM CHLORIDE 0.9% FLUSH
10.0000 mL | INTRAVENOUS | Status: DC | PRN
  Administered 2024-05-15: 10 mL

## 2024-05-16 ENCOUNTER — Inpatient Hospital Stay

## 2024-05-16 VITALS — BP 115/72 | HR 85 | Temp 97.5°F | Resp 18

## 2024-05-16 DIAGNOSIS — C9310 Chronic myelomonocytic leukemia not having achieved remission: Secondary | ICD-10-CM | POA: Diagnosis not present

## 2024-05-16 DIAGNOSIS — Z5111 Encounter for antineoplastic chemotherapy: Secondary | ICD-10-CM | POA: Diagnosis not present

## 2024-05-16 DIAGNOSIS — Z79899 Other long term (current) drug therapy: Secondary | ICD-10-CM | POA: Diagnosis not present

## 2024-05-16 MED ORDER — HEPARIN SOD (PORK) LOCK FLUSH 100 UNIT/ML IV SOLN
500.0000 [IU] | Freq: Once | INTRAVENOUS | Status: DC | PRN
Start: 2024-05-16 — End: 2024-05-16

## 2024-05-16 MED ORDER — ONDANSETRON HCL 4 MG/2ML IJ SOLN
8.0000 mg | Freq: Once | INTRAMUSCULAR | Status: AC
  Administered 2024-05-16: 8 mg via INTRAVENOUS
  Filled 2024-05-16: qty 4

## 2024-05-16 MED ORDER — SODIUM CHLORIDE 0.9 % IV SOLN
75.0000 mg/m2 | Freq: Once | INTRAVENOUS | Status: AC
  Administered 2024-05-16: 114 mg via INTRAVENOUS
  Filled 2024-05-16: qty 11.4

## 2024-05-16 MED ORDER — SODIUM CHLORIDE 0.9% FLUSH
10.0000 mL | INTRAVENOUS | Status: DC | PRN
Start: 2024-05-16 — End: 2024-05-16

## 2024-05-16 MED ORDER — SODIUM CHLORIDE 0.9 % IV SOLN: Freq: Once | INTRAVENOUS | Status: AC

## 2024-05-16 NOTE — Patient Instructions (Signed)
 [facility] - A DEPT OF [facility]  Discharge Instructions: Thank you for choosing [facility] to provide your oncology and hematology care.   If you have a lab appointment with the Cancer Center, please go directly to the Cancer Center and check in at the registration area.   Wear comfortable clothing and clothing appropriate for easy access to any Portacath or PICC line.   We strive to give you quality time with your provider. You may need to reschedule your appointment if you arrive late (15 or more minutes).  Arriving late affects you and other patients whose appointments are after yours.  Also, if you miss three or more appointments without notifying the office, you may be dismissed from the clinic at the provider's discretion.      For prescription refill requests, have your pharmacy contact our office and allow 72 hours for refills to be completed.    Today you received the following chemotherapy and/or immunotherapy agents: Vidaza      To help prevent nausea and vomiting after your treatment, we encourage you to take your nausea medication as directed.  BELOW ARE SYMPTOMS THAT SHOULD BE REPORTED IMMEDIATELY: *FEVER GREATER THAN 100.4 F (38 C) OR HIGHER *CHILLS OR SWEATING *NAUSEA AND VOMITING THAT IS NOT CONTROLLED WITH YOUR NAUSEA MEDICATION *UNUSUAL SHORTNESS OF BREATH *UNUSUAL BRUISING OR BLEEDING *URINARY PROBLEMS (pain or burning when urinating, or frequent urination) *BOWEL PROBLEMS (unusual diarrhea, constipation, pain near the anus) TENDERNESS IN MOUTH AND THROAT WITH OR WITHOUT PRESENCE OF ULCERS (sore throat, sores in mouth, or a toothache) UNUSUAL RASH, SWELLING OR PAIN  UNUSUAL VAGINAL DISCHARGE OR ITCHING   Items with * indicate a potential emergency and should be followed up as soon as possible or go to the Emergency Department if any problems should occur.  Please show the CHEMOTHERAPY ALERT CARD or IMMUNOTHERAPY  ALERT CARD at check-in to the Emergency Department and triage nurse.  Should you have questions after your visit or need to cancel or reschedule your appointment, please contact [facility] - A DEPT OF [facility]  Dept: [phone]  and follow the prompts.  Office hours are 8:00 a.m. to 4:30 p.m. Monday - Friday. Please note that voicemails left after 4:00 p.m. may not be returned until the following business day.  We are closed weekends and major holidays. You have access to a nurse at all times for urgent questions. Please call the main number to the clinic Dept: [phone] and follow the prompts.   For any non-urgent questions, you may also contact your provider using MyChart. We now offer e-Visits for anyone 5 and older to request care online for non-urgent symptoms. For details visit [email or web address].   Also download the MyChart app! Go to the app store, search "MyChart", open the app, select [facility], and log in with your MyChart username and password.

## 2024-05-21 ENCOUNTER — Other Ambulatory Visit: Payer: Self-pay | Admitting: Nurse Practitioner

## 2024-05-21 ENCOUNTER — Other Ambulatory Visit: Payer: Self-pay

## 2024-05-21 MED ORDER — STIOLTO RESPIMAT 2.5-2.5 MCG/ACT IN AERS: 2.0000 | INHALATION_SPRAY | Freq: Every day | RESPIRATORY_TRACT | 11 refills | Status: DC

## 2024-05-21 NOTE — Telephone Encounter (Signed)
 Copied from CRM [id number]. Topic: Clinical - Medication Refill >> May 21, 2024  9:42 AM Corean Deutscher wrote: Medication: Tiotropium Bromide-Olodaterol (STIOLTO RESPIMAT ) 2.5-2.5 MCG/ACT AERS  Has the patient contacted their pharmacy? Yes (Agent: If no, request that the patient contact the pharmacy for the refill. If patient does not wish to contact the pharmacy document the reason why and proceed with request.) (Agent: If yes, when and what did the pharmacy advise?)  This is the patient's preferred pharmacy:  [id number] - [place], [place] - [address] AT [facility] OF [address] [place] [address] Phone: [phone] Fax: [phone]   Is this the correct pharmacy for this prescription? Yes If no, delete pharmacy and type the correct one.   Has the prescription been filled recently? No  Is the patient out of the medication? No, patient stated she has maybe 1 more day.  Has the patient been seen for an appointment in the last year OR does the patient have an upcoming appointment? Yes  Can we respond through MyChart? Yes  Agent: Please be advised that Rx refills may take up to 3 business days. We ask that you follow-up with your pharmacy.

## 2024-05-26 ENCOUNTER — Ambulatory Visit: Payer: Self-pay | Admitting: Nurse Practitioner

## 2024-05-26 NOTE — Progress Notes (Signed)
 CT scan shows some changes that could be concerning for progression of her leukemia. I am going to forward to her oncology team. The right effusion has resolved since her last scan, which is good. There is some interstitial thickening which could be inflammation, fluid or mild interstitial lung disease. Will reassess on follow up. Ensure she has follow up scheduled with us .

## 2024-06-10 DIAGNOSIS — I700 Atherosclerosis of aorta: Secondary | ICD-10-CM | POA: Diagnosis not present

## 2024-06-10 DIAGNOSIS — I480 Paroxysmal atrial fibrillation: Secondary | ICD-10-CM | POA: Diagnosis not present

## 2024-06-10 DIAGNOSIS — M069 Rheumatoid arthritis, unspecified: Secondary | ICD-10-CM | POA: Diagnosis not present

## 2024-06-10 DIAGNOSIS — J449 Chronic obstructive pulmonary disease, unspecified: Secondary | ICD-10-CM | POA: Diagnosis not present

## 2024-06-10 DIAGNOSIS — I129 Hypertensive chronic kidney disease with stage 1 through stage 4 chronic kidney disease, or unspecified chronic kidney disease: Secondary | ICD-10-CM | POA: Diagnosis not present

## 2024-06-10 DIAGNOSIS — D693 Immune thrombocytopenic purpura: Secondary | ICD-10-CM | POA: Diagnosis not present

## 2024-06-10 DIAGNOSIS — C9310 Chronic myelomonocytic leukemia not having achieved remission: Secondary | ICD-10-CM | POA: Diagnosis not present

## 2024-06-10 DIAGNOSIS — E7800 Pure hypercholesterolemia, unspecified: Secondary | ICD-10-CM | POA: Diagnosis not present

## 2024-06-10 DIAGNOSIS — M810 Age-related osteoporosis without current pathological fracture: Secondary | ICD-10-CM | POA: Diagnosis not present

## 2024-06-10 DIAGNOSIS — N1831 Chronic kidney disease, stage 3a: Secondary | ICD-10-CM | POA: Diagnosis not present

## 2024-06-10 DIAGNOSIS — I5022 Chronic systolic (congestive) heart failure: Secondary | ICD-10-CM | POA: Diagnosis not present

## 2024-06-10 DIAGNOSIS — D509 Iron deficiency anemia, unspecified: Secondary | ICD-10-CM | POA: Diagnosis not present

## 2024-06-19 DIAGNOSIS — M0579 Rheumatoid arthritis with rheumatoid factor of multiple sites without organ or systems involvement: Secondary | ICD-10-CM | POA: Diagnosis not present

## 2024-06-21 ENCOUNTER — Other Ambulatory Visit: Payer: Self-pay | Admitting: Cardiovascular Disease

## 2024-06-22 NOTE — Assessment & Plan Note (Signed)
 diagnosed in 10/2021 ---She developed worsening back pain, pelvic MRI 07/04/21 showed a mass in the presacral space. 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  -She began azacitadine on 02/06/22. she has had multiple hospital admissions and her performance status has been very low.  Changed her chemo to every 6 weeks in 08/2022 due to her fatigue. -she is tolerating every 6 weeks much better -she saw Dr. Sharyne Richters in Feb 2024, who recommends to continue current therapy  -Due to worsening low back pain, she completed palliative radiation on 07/02/2023 -due to worsening low back pain, she was hospitalized on November 12, 2023.  Currently follow-up with palliative care for pain control.

## 2024-06-23 ENCOUNTER — Inpatient Hospital Stay

## 2024-06-23 ENCOUNTER — Inpatient Hospital Stay: Attending: Nurse Practitioner

## 2024-06-23 ENCOUNTER — Inpatient Hospital Stay (HOSPITAL_BASED_OUTPATIENT_CLINIC_OR_DEPARTMENT_OTHER): Admitting: Hematology

## 2024-06-23 ENCOUNTER — Encounter: Payer: Self-pay | Admitting: Hematology

## 2024-06-23 VITALS — BP 120/62 | HR 71 | Temp 97.3°F | Resp 16 | Ht 61.0 in | Wt 107.6 lb

## 2024-06-23 DIAGNOSIS — C9310 Chronic myelomonocytic leukemia not having achieved remission: Secondary | ICD-10-CM | POA: Diagnosis not present

## 2024-06-23 DIAGNOSIS — Z79899 Other long term (current) drug therapy: Secondary | ICD-10-CM | POA: Insufficient documentation

## 2024-06-23 DIAGNOSIS — Z5111 Encounter for antineoplastic chemotherapy: Secondary | ICD-10-CM | POA: Diagnosis present

## 2024-06-23 LAB — CBC WITH DIFFERENTIAL (CANCER CENTER ONLY)
Abs Immature Granulocytes: 1.67 10*3/uL — ABNORMAL HIGH (ref 0.00–0.07)
Basophils Absolute: 0.6 10*3/uL — ABNORMAL HIGH (ref 0.0–0.1)
Basophils Relative: 3 %
Eosinophils Absolute: 0.2 10*3/uL (ref 0.0–0.5)
Eosinophils Relative: 1 %
HCT: 34.6 % — ABNORMAL LOW (ref 36.0–46.0)
Hemoglobin: 11 g/dL — ABNORMAL LOW (ref 12.0–15.0)
Immature Granulocytes: 9 %
Lymphocytes Relative: 8 %
Lymphs Abs: 1.6 10*3/uL (ref 0.7–4.0)
MCH: 32.4 pg (ref 26.0–34.0)
MCHC: 31.8 g/dL (ref 30.0–36.0)
MCV: 101.8 fL — ABNORMAL HIGH (ref 80.0–100.0)
Monocytes Absolute: 4.1 10*3/uL — ABNORMAL HIGH (ref 0.1–1.0)
Monocytes Relative: 21 %
Neutro Abs: 11.3 10*3/uL — ABNORMAL HIGH (ref 1.7–7.7)
Neutrophils Relative %: 58 %
Platelet Count: 249 10*3/uL (ref 150–400)
RBC: 3.4 MIL/uL — ABNORMAL LOW (ref 3.87–5.11)
RDW: 15.8 % — ABNORMAL HIGH (ref 11.5–15.5)
WBC Count: 19.4 10*3/uL — ABNORMAL HIGH (ref 4.0–10.5)
nRBC: 0.1 % (ref 0.0–0.2)

## 2024-06-23 LAB — BASIC METABOLIC PANEL - CANCER CENTER ONLY
Anion gap: 9 (ref 5–15)
BUN: 20 mg/dL (ref 8–23)
CO2: 25 mmol/L (ref 22–32)
Calcium: 9 mg/dL (ref 8.9–10.3)
Chloride: 103 mmol/L (ref 98–111)
Creatinine: 1.09 mg/dL — ABNORMAL HIGH (ref 0.44–1.00)
GFR, Estimated: 52 mL/min — ABNORMAL LOW (ref >60)
Glucose, Bld: 120 mg/dL — ABNORMAL HIGH (ref 70–99)
Potassium: 4.5 mmol/L (ref 3.5–5.1)
Sodium: 137 mmol/L (ref 135–145)

## 2024-06-23 MED ORDER — ONDANSETRON HCL 4 MG/2ML IJ SOLN
8.0000 mg | Freq: Once | INTRAMUSCULAR | Status: AC
  Administered 2024-06-23: 8 mg via INTRAVENOUS
  Filled 2024-06-23: qty 4

## 2024-06-23 MED ORDER — SODIUM CHLORIDE 0.9 % IV SOLN: Freq: Once | INTRAVENOUS | Status: AC

## 2024-06-23 MED ORDER — SODIUM CHLORIDE 0.9% FLUSH
10.0000 mL | INTRAVENOUS | Status: DC | PRN
Start: 2024-06-23 — End: 2024-06-23
  Administered 2024-06-23: 10 mL

## 2024-06-23 MED ORDER — SODIUM CHLORIDE 0.9 % IV SOLN
75.0000 mg/m2 | Freq: Once | INTRAVENOUS | Status: AC
  Administered 2024-06-23: 114 mg via INTRAVENOUS
  Filled 2024-06-23: qty 11.4

## 2024-06-23 MED ORDER — HEPARIN SOD (PORK) LOCK FLUSH 100 UNIT/ML IV SOLN
500.0000 [IU] | Freq: Once | INTRAVENOUS | Status: AC | PRN
  Administered 2024-06-23: 500 [IU]

## 2024-06-23 NOTE — Patient Instructions (Signed)
 [facility] - A DEPT OF [facility]  Discharge Instructions: Thank you for choosing [facility] to provide your oncology and hematology care.   If you have a lab appointment with the Cancer Center, please go directly to the Cancer Center and check in at the registration area.   Wear comfortable clothing and clothing appropriate for easy access to any Portacath or PICC line.   We strive to give you quality time with your provider. You may need to reschedule your appointment if you arrive late (15 or more minutes).  Arriving late affects you and other patients whose appointments are after yours.  Also, if you miss three or more appointments without notifying the office, you may be dismissed from the clinic at the provider's discretion.      For prescription refill requests, have your pharmacy contact our office and allow 72 hours for refills to be completed.    Today you received the following chemotherapy and/or immunotherapy agents: Vidaza      To help prevent nausea and vomiting after your treatment, we encourage you to take your nausea medication as directed.  BELOW ARE SYMPTOMS THAT SHOULD BE REPORTED IMMEDIATELY: *FEVER GREATER THAN 100.4 F (38 C) OR HIGHER *CHILLS OR SWEATING *NAUSEA AND VOMITING THAT IS NOT CONTROLLED WITH YOUR NAUSEA MEDICATION *UNUSUAL SHORTNESS OF BREATH *UNUSUAL BRUISING OR BLEEDING *URINARY PROBLEMS (pain or burning when urinating, or frequent urination) *BOWEL PROBLEMS (unusual diarrhea, constipation, pain near the anus) TENDERNESS IN MOUTH AND THROAT WITH OR WITHOUT PRESENCE OF ULCERS (sore throat, sores in mouth, or a toothache) UNUSUAL RASH, SWELLING OR PAIN  UNUSUAL VAGINAL DISCHARGE OR ITCHING   Items with * indicate a potential emergency and should be followed up as soon as possible or go to the Emergency Department if any problems should occur.  Please show the CHEMOTHERAPY ALERT CARD or IMMUNOTHERAPY  ALERT CARD at check-in to the Emergency Department and triage nurse.  Should you have questions after your visit or need to cancel or reschedule your appointment, please contact [facility] - A DEPT OF [facility]  Dept: [phone]  and follow the prompts.  Office hours are 8:00 a.m. to 4:30 p.m. Monday - Friday. Please note that voicemails left after 4:00 p.m. may not be returned until the following business day.  We are closed weekends and major holidays. You have access to a nurse at all times for urgent questions. Please call the main number to the clinic Dept: [phone] and follow the prompts.   For any non-urgent questions, you may also contact your provider using MyChart. We now offer e-Visits for anyone 5 and older to request care online for non-urgent symptoms. For details visit [email or web address].   Also download the MyChart app! Go to the app store, search "MyChart", open the app, select [facility], and log in with your MyChart username and password.

## 2024-06-23 NOTE — Progress Notes (Signed)
 [facility]   Telephone:([phone]) [phone] [phone]   Clinic Follow up Note   Patient Care Team: Sherry Velna SAUNDERS, MD as PCP - General (Internal Medicine) [name], [name], MD as PCP - Cardiology (Cardiology) Sherry Slough, MD as Consulting Physician (Rheumatology) Sherry Callander, MD as Consulting Physician (Hematology) Sherry Lamprey, MD as Consulting Physician (Gastroenterology) Icard, [name], DO (Inactive) as Consulting Physician (Pulmonary Disease) Pickenpack-Cousar, [name], NP as Nurse Practitioner [facility] and Palliative Medicine)  Date of Service:  06/23/2024  CHIEF COMPLAINT: f/u of CMML  CURRENT THERAPY:  Azacitidine  every 6 weeks  Oncology History   CMML (chronic myelomonocytic leukemia) (HCC) diagnosed in 10/2021 ---She developed worsening back pain, pelvic MRI 07/04/21 showed a mass in the presacral space. 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 Her BM biopsy was reviewed at [facility] and was felt to be CMML-1 with 5% blasts.  -She began azacitadine on 02/06/22. she has had multiple hospital admissions and her performance status has been very low.  Changed her chemo to every 6 weeks in 08/2022 due to her fatigue. -she is tolerating every 6 weeks much better -she saw [name] in Feb 2024, who recommends to continue current therapy  -Due to worsening low back pain, she completed palliative radiation on 07/02/2023 -due to worsening low back pain, she was hospitalized on November 12, 2023.  Currently follow-up with palliative care for pain control.  Assessment & Plan Chronic myelomonocytic leukemia (CMML) CMML is well-managed with current treatment. White blood cell count is elevated at 19.4 but stable. Hemoglobin is at 11, which is considered good, and platelet count is normal. She is  interested in exploring clinical trials, and her performance status has improved, though she may still not meet the criteria for most trials. - Continue current treatment every six weeks. - Inquire with [name] regarding potential clinical trials at [facility]. - Schedule next treatment session for August 11 and subsequent session for September 22.  Atrial fibrillation Atrial fibrillation is well-controlled with no new issues reported.  Shoulder pain due to need for replacement Shoulder pain persists due to the need for replacement, but the procedure is currently on hold. The pain is managed with Tylenol , which she takes almost daily, timing it around her activities.  Rash on legs The rash on the legs has resolved, with no recurrence reported in the last six months.  Plan - He is clinically stable, lab reviewed, mild anemia and leukocytosis is stable, will proceed azacitidine  today and continue for the next 4 days - Follow-up in 6 weeks before next cycle treatment - I reached out to [name] at [facility], they unfortunately do not have clinical trial for her   SUMMARY OF ONCOLOGIC HISTORY: Oncology History Overview Note   Cancer Staging  CMML (chronic myelomonocytic leukemia) (HCC) Staging form: Chronic Myeloid Leukemia, AJCC 8th Edition - Clinical stage from 01/12/2022: Bone marrow blast count (%): 12, Additional clonal changes: Unknown - Signed by Sherry Callander, MD on 01/12/2022 Stage prefix: Initial diagnosis     CMML (chronic myelomonocytic leukemia) (HCC)  11/09/2021 Initial Biopsy   DIAGNOSIS:   -  Monoclonal B-cell population with co-expression of CD5 comprises 17%  of all lymphocytes  -  See comment   COMMENT:  In addition to the clonal B-cell population, there is a myeloblast  population (CD34, CD38, HLA-DR, CD117, CD123 and  CD33) that comprises 2% of the total cellular events.  Please see concurrent tissue biopsy (below) for additional work-up and final diagnosis.     FINAL MICROSCOPIC DIAGNOSIS:   A. SOFT TISSUE MASS, PRE SACRAL, NEEDLE CORE BIOPSY:  -  Chronic lymphocytic leukemia/small lymphocytic lymphoma  -  Extra medullary hematopoiesis  -  See comment   COMMENT:  The biopsy consists of multiple soft tissue cores with lymphoid nodules and a dense hematopoietic infiltrate consistent with extra medullary hematopoiesis.  MPO and E-cadherin highlight myeloid and erythroid precursors respectively.  CD34 highlights increased vasculature and is also positive within the cytoplasm of megakaryocytes.  A few small, immature mononuclear cells appear to be positive for CD34 and CD117. TdT shows rare, scattered positive cells.  CD20 highlights aggregates of B cells which are admixed with CD3 positive T cells.  T cells are an admixture of CD4 and CD8.  The B cells are also positive for CD5, CD23 and Bcl-2.  The B cells do not show significant staining for CD10, BCL6 or cyclin D1.  CD138 highlights scattered plasma cells which are polytypic by kappa and lambda in situ hybridization.  Flow cytometry performed on the sample (see WL [id number]) identified a kappa restricted CD5 positive B-cell population comprising 70% of lymphocytes.  In addition, a small myeloblast population comprised 2% of the total cellular events.   Overall, the findings are consistent with soft tissue involvement by  chronic lymphocytic leukemia/small lymphocytic lymphoma and extra medullary hematopoiesis. In reviewing the patient's CBC data (macrocytic anemia and thrombocytopenia), I would recommend a bone marrow biopsy to assess for marrow involvement by CLL/SLL.    12/23/2021 Imaging   EXAM: CT CHEST, ABDOMEN, AND PELVIS WITH CONTRAST  IMPRESSION: 1. Slight interval enlargement of a presacral soft tissue mass measuring 7.2 x 4.7 cm, previously 6.9 x 4.1 cm on prior MR of the pelvis dated 07/04/2021. By report, this represents a biopsy proven lymphoma. 2. Pleural nodule of the dependent  right lower lobe overlying the posterior right tenth rib and pleural or paraspinous soft tissue mass overlying the right aspect of the T10 vertebral body, very slightly enlarged compared to prior examination of the chest dated 06/25/2020, consistent with additional sites of lymphomatous involvement given very indolent growth. These could be better assessed for metabolic activity by FDG PET/CT if desired. 3. There is mild, bibasilar predominant pulmonary fibrosis in a pattern featuring irregular peripheral interstitial opacity, septal thickening, but without clear evidence of subpleural bronchiolectasis or honeycombing, with a somewhat asymmetric distribution most conspicuously involving the right lower lobe and lingula. These findings are significantly worsened when compared to prior examination dated 06/25/2020, particularly in the right lower lobe. Given interval change, this may reflect sequelae of interval infection or aspiration, however appearance is generally suspicious for fibrotic interstitial lung disease, and if characterized by ATS pulmonary fibrosis is in an indeterminate for UIP pattern, differential considerations including both UIP and NSIP. 4. Emphysema.   Aortic Atherosclerosis (ICD10-I70.0) and Emphysema (ICD10-J43.9).   01/05/2022 Pathology Results   DIAGNOSIS:   BONE MARROW, ASPIRATE, CLOT, CORE:  -Hypercellular bone marrow for age with features of  myelodysplastic/myeloproliferative neoplasm  -Minor abnormal B-cell population  -See comment   PERIPHERAL BLOOD:  -Macrocytic anemia  -Neutrophilic left shift and monocytosis  -Thrombocytopenia   COMMENT:  The bone marrow is hypercellular for age with dyspoietic changes  involving myeloid cell lines associated with monocytosis and increased number of blastic cells (12%) as primarily seen by morphology, many of which display  monocytic features.  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  In this background, there are several predominantly small lymphoid aggregates mostly composed of small lymphoid cells.  By flow cytometry, a minor abnormal B-cell population expressing CD5 is seen and representing 2% of all cells.  This correlate with previously known B-cell lymphoproliferative process.  Correlation with cytogenetic and FISH studies is strongly recommended.    DIAGNOSIS:   -Increased number of monocytic cells present (25%)  -Minor abnormal B-cell population identified.  -See comment   COMMENT:  Flow cytometric analysis shows increased number of monocytic cells representing 25% of all cells but without aberrant phenotype or CD34 expression.  A significant CD34-positive blastic population is not identified.  The lymphoid population shows a minor B-cell population representing 2% of all cells and expressing B-cell antigens including CD20 associated with CD5, CD200 and possibly dim kappa expression.  The latter findings are abnormal and correlate with previously known B-cell lymphoproliferative process.  No significant T-cell phenotypic abnormalities identified.    01/12/2022 Initial Diagnosis   CMML (chronic myelomonocytic leukemia) (HCC)   01/12/2022 Cancer Staging   Staging form: Chronic Myeloid Leukemia, AJCC 8th Edition - Clinical stage from 01/12/2022: Bone marrow blast count (%): 12, Additional clonal changes: Unknown - Signed by Sherry Callander, MD on 01/12/2022 Stage prefix: Initial diagnosis   02/06/2022 - 08/11/2022 Chemotherapy   Patient is on Treatment Plan : MYELODYSPLASIA  Azacitidine  IV D1-7 q28d     02/06/2022 -  Chemotherapy   Patient is on Treatment Plan : MYELODYSPLASIA  Azacitidine  IV D1-7 q28d     06/03/2023 Imaging   MR lumbar spine with and without contrast IMPRESSION: 1. Long-standing presacral mass, at least  doubled in size since 2016, now at least 8 cm in length. There is internal fat components and differential considerations include myelolipoma, liposarcoma, and teratoma. 2. 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 3. L2-3 left paracentral extrusion compressing the left L3 nerve root. 4. Right foraminal impingement at L3-4 to L4-5, greatest at L4-5. Right subarticular recess narrowing at L3-4 and L4-5.   07/09/2023 Imaging   CT abdomen and pelvis with contrast  IMPRESSION: No acute findings in the abdomen or pelvis. Specifically, no findings to explain the patient's history of pain. 2. Interval progression of presacral and right thoracic paraspinal soft tissue lesions compatible with mild progression of disease since 06/19/2022. Small posterior right thoracic chest wall lesion also mildly progressive. 3. Mild circumferential wall thickening distal esophagus. Esophagitis would be a consideration. 4.  Aortic Atherosclerosis (ICD10-I70.0).      Discussed the use of AI scribe software for clinical note transcription with the patient, who gave verbal consent to proceed.  History of Present Illness [name] is a 78 year old female with chronic myelomonocytic leukemia who presents for follow-up.  Her white blood cell count is elevated at 19.4 but stable. Hemoglobin is at 11, and platelet count is normal. She is undergoing treatment every six weeks with improvement in her overall condition. A benign mass related to her CMML is present without new pain or complications.  Shoulder pain due to a postponed joint replacement is tolerable and managed with Tylenol . Back pain is also managed with Tylenol , timed around activities. Ankle swelling reduces with elevation using a recliner.  She is able to drive and perform daily activities, requiring assistance with stairs and parking lots to prevent  falls. Bowel movements are normal, and she can stand and urinate  without issues. No new pain or issues related to atrial fibrillation.     All other systems were reviewed with the patient and are negative.  MEDICAL HISTORY:  Past Medical History:  Diagnosis Date   Arthritis    Rheumatoid arthritis   Celiac disease    Chronic kidney disease    stage 3 from MD notes   COPD (chronic obstructive pulmonary disease) (HCC)    Dyspnea    with going up stairs   Family history of adverse reaction to anesthesia    father had hard time waking up   Headache    sinus headaches   Hot flashes    Hypertension    Iron deficiency anemia    Pneumonia    per patient I have walking pneumonia    SURGICAL HISTORY: Past Surgical History:  Procedure Laterality Date   COLONOSCOPY     ECTOPIC PREGNANCY SURGERY      x 2   IR IMAGING GUIDED PORT INSERTION  01/31/2022   REVERSE SHOULDER ARTHROPLASTY Right 02/02/2017   Procedure: RIGHT REVERSE SHOULDER ARTHROPLASTY;  Surgeon: Marcey Her, MD;  Location: [facility] OR;  Service: Orthopedics;  Laterality: Right;    I have reviewed the social history and family history with the patient and they are unchanged from previous note.  ALLERGIES:  is allergic to digoxin  and related, gluten meal, penicillins, fexofenadine, alendronate, and hydroxychloroquine.  MEDICATIONS:  Current Outpatient Medications  Medication Sig Dispense Refill   acetaminophen  (TYLENOL ) 500 MG tablet Take 2 tablets (1,000 mg total) by mouth 3 (three) times daily. 30 tablet 0   apixaban  (ELIQUIS ) 5 MG TABS tablet TAKE 1 TABLET(5 MG) BY MOUTH TWICE DAILY 60 tablet 5   azaCITIDine  (VIDAZA ) 100 MG SUSR 114mg  Injection every 6 weeks     denosumab  (PROLIA ) 60 MG/ML SOSY injection Inject 60 mg into the skin every 6 (six) months.     diltiazem  (CARDIZEM  CD) 360 MG 24 hr capsule Take 1 capsule (360 mg total) by mouth daily. 90 capsule 3   furosemide  (LASIX ) 20 MG tablet Take 1 tablet (20 mg total) by mouth daily. 30 tablet 11   gabapentin  (NEURONTIN ) 300 MG  capsule Take 300 mg by mouth in the morning.     Metoprolol  Tartrate 75 MG TABS Take 2 tablets (150 mg total) by mouth 2 (two) times daily. 360 tablet 1   predniSONE  (DELTASONE ) 5 MG tablet Take 5 mg by mouth daily with breakfast.     Tiotropium Bromide-Olodaterol (STIOLTO RESPIMAT ) 2.5-2.5 MCG/ACT AERS Inhale 2 puffs into the lungs daily. 4 g 11   albuterol  (VENTOLIN  HFA) 108 (90 Base) MCG/ACT inhaler Inhale 2 puffs into the lungs every 6 (six) hours as needed for wheezing or shortness of breath. (Patient not taking: Reported on 06/23/2024) 8 g 2   No current facility-administered medications for this visit.   Facility-Administered Medications Ordered in Other Visits  Medication Dose Route Frequency Provider Last Rate Last Admin   acetaminophen  (TYLENOL ) 325 MG tablet            diphenhydrAMINE  (BENADRYL ) 25 mg capsule            sodium chloride  flush (NS) 0.9 % injection 10 mL  10 mL Intracatheter PRN Sherry Callander, MD   10 mL at 06/23/24 1444    PHYSICAL EXAMINATION: ECOG PERFORMANCE STATUS: 2 - Symptomatic, <50% confined to bed  Vitals:   06/23/24 1305  BP: 120/62  Pulse: 71  Resp: 16  Temp: (!) 97.3 F (36.3 C)  SpO2: 94%   Wt Readings from Last 3 Encounters:  06/23/24 107 lb 9.6 oz (48.8 kg)  05/12/24 105 lb 8 oz (47.9 kg)  04/23/24 107 lb 3.2 oz (48.6 kg)     GENERAL:alert, no distress and comfortable SKIN: skin color, texture, turgor are normal, no rashes or significant lesions EYES: normal, Conjunctiva are pink and non-injected, sclera clear NECK: supple, thyroid  normal size, non-tender, without nodularity LYMPH:  no palpable lymphadenopathy in the cervical, axillary  LUNGS: clear to auscultation and percussion with normal breathing effort HEART: regular rate & rhythm and no murmurs and no lower extremity edema ABDOMEN:abdomen soft, non-tender and normal bowel sounds Musculoskeletal:no cyanosis of digits and no clubbing  NEURO: alert & oriented x 3 with fluent speech, no  focal motor/sensory deficits  Physical Exam ABDOMEN: Abdomen soft and non-tender.  LABORATORY DATA:  I have reviewed the data as listed    Latest Ref Rng & Units 06/23/2024   12:42 PM 05/12/2024   12:51 PM 04/04/2024    9:03 AM  CBC  WBC 4.0 - 10.5 K/uL 19.4  22.3  18.0   Hemoglobin 12.0 - 15.0 g/dL 88.9  89.2  9.8   Hematocrit 36.0 - 46.0 % 34.6  33.0  31.6   Platelets 150 - 400 K/uL 249  216  184         Latest Ref Rng & Units 06/23/2024   12:42 PM 05/12/2024   12:51 PM 04/04/2024    9:03 AM  CMP  Glucose 70 - 99 mg/dL 879  875  878   BUN 8 - 23 mg/dL 20  28  28    Creatinine 0.44 - 1.00 mg/dL 8.90  8.86  8.82   Sodium 135 - 145 mmol/L 137  137  133   Potassium 3.5 - 5.1 mmol/L 4.5  4.5  3.7   Chloride 98 - 111 mmol/L 103  100  100   CO2 22 - 32 mmol/L 25  28  24    Calcium  8.9 - 10.3 mg/dL 9.0  9.6  8.8   Total Protein 6.5 - 8.1 g/dL   6.4   Total Bilirubin 0.0 - 1.2 mg/dL   1.1   Alkaline Phos 38 - 126 U/L   40   AST 15 - 41 U/L   20   ALT 0 - 44 U/L   14       RADIOGRAPHIC STUDIES: I have personally reviewed the radiological images as listed and agreed with the findings in the report. No results found.    Orders Placed This Encounter  Procedures   CBC with Differential (Cancer Center Only)    Standing Status:   Future    Expected Date:   09/15/2024    Expiration Date:   09/15/2025   Basic Metabolic Panel - Cancer Center Only    Standing Status:   Future    Expected Date:   09/15/2024    Expiration Date:   09/15/2025   All questions were answered. The patient knows to call the clinic with any problems, questions or concerns. No barriers to learning was detected. The total time spent in the appointment was 30 minutes, including review of chart and various tests results, discussions about plan of care and coordination of care plan     Onita Mattock, MD 06/23/2024

## 2024-06-23 NOTE — Telephone Encounter (Signed)
 Prescription refill request for Eliquis  received. Indication: PAF Last office visit: 09/28/23  P [name] MD Scr: 1.13 on 05/12/24  Epic Age: 78 Weight: 48.1kg  Based on above findings Eliquis  5mg  twice daily is the appropriate dose.  Refill approved.

## 2024-06-24 ENCOUNTER — Inpatient Hospital Stay: Attending: Nurse Practitioner

## 2024-06-24 VITALS — BP 110/62 | HR 65 | Temp 98.1°F | Resp 17

## 2024-06-24 DIAGNOSIS — Z5111 Encounter for antineoplastic chemotherapy: Secondary | ICD-10-CM | POA: Diagnosis not present

## 2024-06-24 DIAGNOSIS — C9310 Chronic myelomonocytic leukemia not having achieved remission: Secondary | ICD-10-CM | POA: Diagnosis not present

## 2024-06-24 MED ORDER — SODIUM CHLORIDE 0.9 % IV SOLN: Freq: Once | INTRAVENOUS | Status: AC

## 2024-06-24 MED ORDER — HEPARIN SOD (PORK) LOCK FLUSH 100 UNIT/ML IV SOLN
500.0000 [IU] | Freq: Once | INTRAVENOUS | Status: AC | PRN
  Administered 2024-06-24: 500 [IU]

## 2024-06-24 MED ORDER — SODIUM CHLORIDE 0.9% FLUSH
10.0000 mL | INTRAVENOUS | Status: DC | PRN
  Administered 2024-06-24: 10 mL

## 2024-06-24 MED ORDER — ONDANSETRON HCL 4 MG/2ML IJ SOLN
8.0000 mg | Freq: Once | INTRAMUSCULAR | Status: AC
  Administered 2024-06-24: 8 mg via INTRAVENOUS
  Filled 2024-06-24: qty 4

## 2024-06-24 MED ORDER — SODIUM CHLORIDE 0.9 % IV SOLN
75.0000 mg/m2 | Freq: Once | INTRAVENOUS | Status: AC
  Administered 2024-06-24: 114 mg via INTRAVENOUS
  Filled 2024-06-24: qty 11.4

## 2024-06-24 NOTE — Patient Instructions (Signed)
 [facility]   Discharge Instructions: Thank you for choosing [facility] to provide your oncology and hematology care.   If you have a lab appointment with the Cancer Center, please go directly to the Cancer Center and check in at the registration area.   Wear comfortable clothing and clothing appropriate for easy access to any Portacath or PICC line.   We strive to give you quality time with your provider. You may need to reschedule your appointment if you arrive late (15 or more minutes).  Arriving late affects you and other patients whose appointments are after yours.  Also, if you miss three or more appointments without notifying the office, you may be dismissed from the clinic at the provider's discretion.      For prescription refill requests, have your pharmacy contact our office and allow 72 hours for refills to be completed.    Today you received the following chemotherapy and/or immunotherapy agents: Azacitidine (Vidaza)      To help prevent nausea and vomiting after your treatment, we encourage you to take your nausea medication as directed.  BELOW ARE SYMPTOMS THAT SHOULD BE REPORTED IMMEDIATELY: *FEVER GREATER THAN 100.4 F (38 C) OR HIGHER *CHILLS OR SWEATING *NAUSEA AND VOMITING THAT IS NOT CONTROLLED WITH YOUR NAUSEA MEDICATION *UNUSUAL SHORTNESS OF BREATH *UNUSUAL BRUISING OR BLEEDING *URINARY PROBLEMS (pain or burning when urinating, or frequent urination) *BOWEL PROBLEMS (unusual diarrhea, constipation, pain near the anus) TENDERNESS IN MOUTH AND THROAT WITH OR WITHOUT PRESENCE OF ULCERS (sore throat, sores in mouth, or a toothache) UNUSUAL RASH, SWELLING OR PAIN  UNUSUAL VAGINAL DISCHARGE OR ITCHING   Items with * indicate a potential emergency and should be followed up as soon as possible or go to the Emergency Department if any problems should occur.  Please show the CHEMOTHERAPY ALERT CARD or  IMMUNOTHERAPY ALERT CARD at check-in to the Emergency Department and triage nurse.  Should you have questions after your visit or need to cancel or reschedule your appointment, please contact [facility] - A DEPT OF [facility]  Dept: [phone]  and follow the prompts.  Office hours are 8:00 a.m. to 4:30 p.m. Monday - Friday. Please note that voicemails left after 4:00 p.m. may not be returned until the following business day.  We are closed weekends and major holidays. You have access to a nurse at all times for urgent questions. Please call the main number to the clinic Dept: [phone] and follow the prompts.   For any non-urgent questions, you may also contact your provider using MyChart. We now offer e-Visits for anyone 1 and older to request care online for non-urgent symptoms. For details visit [email or web address].   Also download the MyChart app! Go to the app store, search "MyChart", open the app, select [place], and log in with your MyChart username and password.

## 2024-06-25 ENCOUNTER — Encounter: Payer: Self-pay | Admitting: Hematology

## 2024-06-25 ENCOUNTER — Inpatient Hospital Stay

## 2024-06-25 VITALS — BP 114/74 | HR 93 | Temp 98.0°F | Resp 16

## 2024-06-25 DIAGNOSIS — Z5111 Encounter for antineoplastic chemotherapy: Secondary | ICD-10-CM | POA: Diagnosis not present

## 2024-06-25 DIAGNOSIS — C9310 Chronic myelomonocytic leukemia not having achieved remission: Secondary | ICD-10-CM | POA: Diagnosis not present

## 2024-06-25 MED ORDER — SODIUM CHLORIDE 0.9% FLUSH
10.0000 mL | INTRAVENOUS | Status: DC | PRN
  Administered 2024-06-25: 10 mL

## 2024-06-25 MED ORDER — ONDANSETRON HCL 4 MG/2ML IJ SOLN
8.0000 mg | Freq: Once | INTRAMUSCULAR | Status: AC
  Administered 2024-06-25: 8 mg via INTRAVENOUS
  Filled 2024-06-25: qty 4

## 2024-06-25 MED ORDER — SODIUM CHLORIDE 0.9 % IV SOLN: Freq: Once | INTRAVENOUS | Status: AC

## 2024-06-25 MED ORDER — HEPARIN SOD (PORK) LOCK FLUSH 100 UNIT/ML IV SOLN: 500.0000 [IU] | Freq: Once | INTRAVENOUS | Status: DC | PRN

## 2024-06-25 MED ORDER — SODIUM CHLORIDE 0.9 % IV SOLN
75.0000 mg/m2 | Freq: Once | INTRAVENOUS | Status: AC
  Administered 2024-06-25: 114 mg via INTRAVENOUS
  Filled 2024-06-25: qty 11.4

## 2024-06-25 NOTE — Patient Instructions (Signed)
 [facility]   Discharge Instructions: Thank you for choosing [facility] to provide your oncology and hematology care.   If you have a lab appointment with the Cancer Center, please go directly to the Cancer Center and check in at the registration area.   Wear comfortable clothing and clothing appropriate for easy access to any Portacath or PICC line.   We strive to give you quality time with your provider. You may need to reschedule your appointment if you arrive late (15 or more minutes).  Arriving late affects you and other patients whose appointments are after yours.  Also, if you miss three or more appointments without notifying the office, you may be dismissed from the clinic at the provider's discretion.      For prescription refill requests, have your pharmacy contact our office and allow 72 hours for refills to be completed.    Today you received the following chemotherapy and/or immunotherapy agents: Azacitidine (Vidaza)      To help prevent nausea and vomiting after your treatment, we encourage you to take your nausea medication as directed.  BELOW ARE SYMPTOMS THAT SHOULD BE REPORTED IMMEDIATELY: *FEVER GREATER THAN 100.4 F (38 C) OR HIGHER *CHILLS OR SWEATING *NAUSEA AND VOMITING THAT IS NOT CONTROLLED WITH YOUR NAUSEA MEDICATION *UNUSUAL SHORTNESS OF BREATH *UNUSUAL BRUISING OR BLEEDING *URINARY PROBLEMS (pain or burning when urinating, or frequent urination) *BOWEL PROBLEMS (unusual diarrhea, constipation, pain near the anus) TENDERNESS IN MOUTH AND THROAT WITH OR WITHOUT PRESENCE OF ULCERS (sore throat, sores in mouth, or a toothache) UNUSUAL RASH, SWELLING OR PAIN  UNUSUAL VAGINAL DISCHARGE OR ITCHING   Items with * indicate a potential emergency and should be followed up as soon as possible or go to the Emergency Department if any problems should occur.  Please show the CHEMOTHERAPY ALERT CARD or  IMMUNOTHERAPY ALERT CARD at check-in to the Emergency Department and triage nurse.  Should you have questions after your visit or need to cancel or reschedule your appointment, please contact [facility] - A DEPT OF [facility]  Dept: [phone]  and follow the prompts.  Office hours are 8:00 a.m. to 4:30 p.m. Monday - Friday. Please note that voicemails left after 4:00 p.m. may not be returned until the following business day.  We are closed weekends and major holidays. You have access to a nurse at all times for urgent questions. Please call the main number to the clinic Dept: [phone] and follow the prompts.   For any non-urgent questions, you may also contact your provider using MyChart. We now offer e-Visits for anyone 1 and older to request care online for non-urgent symptoms. For details visit [email or web address].   Also download the MyChart app! Go to the app store, search "MyChart", open the app, select [place], and log in with your MyChart username and password.

## 2024-06-26 ENCOUNTER — Inpatient Hospital Stay

## 2024-06-26 VITALS — BP 119/68 | HR 93 | Temp 98.0°F | Resp 18

## 2024-06-26 DIAGNOSIS — C9310 Chronic myelomonocytic leukemia not having achieved remission: Secondary | ICD-10-CM | POA: Diagnosis not present

## 2024-06-26 DIAGNOSIS — Z5111 Encounter for antineoplastic chemotherapy: Secondary | ICD-10-CM | POA: Diagnosis not present

## 2024-06-26 MED ORDER — ONDANSETRON HCL 4 MG/2ML IJ SOLN
8.0000 mg | Freq: Once | INTRAMUSCULAR | Status: AC
  Administered 2024-06-26: 8 mg via INTRAVENOUS
  Filled 2024-06-26: qty 4

## 2024-06-26 MED ORDER — SODIUM CHLORIDE 0.9% FLUSH
10.0000 mL | INTRAVENOUS | Status: DC | PRN
  Administered 2024-06-26: 10 mL

## 2024-06-26 MED ORDER — SODIUM CHLORIDE 0.9 % IV SOLN: Freq: Once | INTRAVENOUS | Status: AC

## 2024-06-26 MED ORDER — HEPARIN SOD (PORK) LOCK FLUSH 100 UNIT/ML IV SOLN
500.0000 [IU] | Freq: Once | INTRAVENOUS | Status: AC | PRN
  Administered 2024-06-26: 500 [IU]

## 2024-06-26 MED ORDER — SODIUM CHLORIDE 0.9 % IV SOLN
75.0000 mg/m2 | Freq: Once | INTRAVENOUS | Status: AC
  Administered 2024-06-26: 114 mg via INTRAVENOUS
  Filled 2024-06-26: qty 11.4

## 2024-06-30 ENCOUNTER — Inpatient Hospital Stay

## 2024-06-30 VITALS — BP 115/77 | HR 99 | Temp 97.7°F | Resp 19 | Wt 105.0 lb

## 2024-06-30 DIAGNOSIS — Z5111 Encounter for antineoplastic chemotherapy: Secondary | ICD-10-CM | POA: Diagnosis not present

## 2024-06-30 DIAGNOSIS — C9310 Chronic myelomonocytic leukemia not having achieved remission: Secondary | ICD-10-CM | POA: Diagnosis not present

## 2024-06-30 MED ORDER — ONDANSETRON HCL 4 MG/2ML IJ SOLN
8.0000 mg | Freq: Once | INTRAMUSCULAR | Status: AC
  Administered 2024-06-30: 8 mg via INTRAVENOUS
  Filled 2024-06-30: qty 4

## 2024-06-30 MED ORDER — SODIUM CHLORIDE 0.9 % IV SOLN
75.0000 mg/m2 | Freq: Once | INTRAVENOUS | Status: AC
  Administered 2024-06-30: 114 mg via INTRAVENOUS
  Filled 2024-06-30: qty 11.4

## 2024-06-30 MED ORDER — SODIUM CHLORIDE 0.9 % IV SOLN: Freq: Once | INTRAVENOUS | Status: AC

## 2024-06-30 MED ORDER — SODIUM CHLORIDE 0.9% FLUSH
10.0000 mL | INTRAVENOUS | Status: DC | PRN
  Administered 2024-06-30: 10 mL

## 2024-06-30 MED ORDER — HEPARIN SOD (PORK) LOCK FLUSH 100 UNIT/ML IV SOLN
500.0000 [IU] | Freq: Once | INTRAVENOUS | Status: AC | PRN
  Administered 2024-06-30: 500 [IU]

## 2024-07-11 ENCOUNTER — Telehealth: Payer: Self-pay | Admitting: Hematology

## 2024-07-11 NOTE — Telephone Encounter (Signed)
 Scheduled appointments per WQ. Talked with the patient and she is aware of the made appointments.

## 2024-07-16 ENCOUNTER — Ambulatory Visit: Admitting: Nurse Practitioner

## 2024-07-16 ENCOUNTER — Encounter: Payer: Self-pay | Admitting: Nurse Practitioner

## 2024-07-16 VITALS — BP 124/74 | HR 66 | Ht 63.0 in | Wt 106.8 lb

## 2024-07-16 DIAGNOSIS — C9210 Chronic myeloid leukemia, BCR/ABL-positive, not having achieved remission: Secondary | ICD-10-CM

## 2024-07-16 DIAGNOSIS — R918 Other nonspecific abnormal finding of lung field: Secondary | ICD-10-CM | POA: Diagnosis not present

## 2024-07-16 DIAGNOSIS — R590 Localized enlarged lymph nodes: Secondary | ICD-10-CM | POA: Diagnosis not present

## 2024-07-16 DIAGNOSIS — J449 Chronic obstructive pulmonary disease, unspecified: Secondary | ICD-10-CM

## 2024-07-16 DIAGNOSIS — I4891 Unspecified atrial fibrillation: Secondary | ICD-10-CM

## 2024-07-16 DIAGNOSIS — C9310 Chronic myelomonocytic leukemia not having achieved remission: Secondary | ICD-10-CM

## 2024-07-16 DIAGNOSIS — Z87891 Personal history of nicotine dependence: Secondary | ICD-10-CM

## 2024-07-16 NOTE — Assessment & Plan Note (Addendum)
 Stable and compensated on current regimen. Minimal symptom burden. Compliant with therapy. No recent exacerbations. Action plan in place  Patient Instructions  Continue Stiolto 2 puffs daily  Continue Albuterol  inhaler 2 puffs every 6 hours as needed for shortness of breath or wheezing. Notify if symptoms persist despite rescue inhaler/neb use.   Follow up with oncology as scheduled   Repeat CT chest in September 2025 to follow up on the enlarged lymph node    Follow up in late September with Dr. Shelah (new pt 30 min slot). If symptoms worsen, please contact office for sooner follow up or seek emergency care.

## 2024-07-16 NOTE — Assessment & Plan Note (Signed)
 Felt to represent medullary hematopoiesis or leukemic deposits. Slight interval increase. Will reassess on repeat CT chest in September 2025.

## 2024-07-16 NOTE — Assessment & Plan Note (Signed)
Rate controlled. On chronic AC. Follow up with cardiology as scheduled.

## 2024-07-16 NOTE — Assessment & Plan Note (Addendum)
 Undergoing treatment with oncology. Concern for possible disease progression on CT imaging from May 2025. Repeat CT w/ con Sept. 2025 for 3-6 month follow up. Results were routed to Dr. Lanny, who confirmed receipt. She continues on azactidine. Routed pt's CT results to pt's oncologist at [facility], by request of pt and her husband. Advised them to follow up with Dr. Lanny.

## 2024-07-16 NOTE — Progress Notes (Signed)
 @Patient  ID: [name], female    DOB: [birth year], 78 y.o.   MRN: [id number]  Chief Complaint  Patient presents with   COPD    Patient states no improvement, she still gets out of breath when walking.    Referring provider: [name], MD  HPI: 78 year old female, former smoker followed for COPD Gold 2 and lung nodules. She is a former patient of Dr. Harriet and last seen in office 04/23/2024 by Alberto Pina NP. Past medical history significant for rheumatoid arthritis on remicade  and daily prednisone , PAF, CHF, celiac disease, GERD, CKD stage 3. She is followed by Dr. Ishmael with rheumatology. 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 Lanny.   TEST/EVENTS:  05/03/2021 PFT: FVC 80, FEV1 73, ratio 68, TLC 89, DLCOcor 55. No BD 03/29/2022 CTA chest: mild cardiomegaly. moderate right and trace left pleural effusions. Patchy airspace opacities in RLL and LUL; mild opacity in LLL. Numerous right-sided pleural based mases increased in size. Right sided paraspinal soft tissue mass at the T10 level, now 4.7x2. 03/30/2022 echo: 45-50%. Global hypokinesis. RV function mildly reduced. RV size nl. Mildly elevated PASP. Mild to moderate MR.  05/12/2024 CT chest: mild cardiomegaly. Atherosclerosis. Interval development of single mildly enlarged left axillary lymph node, 12 mm. Moderate emphysema. Effusion resolved. Multiple subpleural/paraspinal masses in right hemithorax, slight interval increase. Multiple additional similar nodular masses, may reflect medullary hematopoiesis or leukemic deposits. Mild interstitial thickening at bases, trac pulmonary edema suggested. Cholelithiasis. Simple cyst in left hepatic lobe   09/27/2022: OV with Dr. Brenna. Last seen in March. 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 She also had a paraspinal soft tissue mass at the level of T10. Diagnosed via CT guided tissue biopsy. Currently undergoing chemo  treatments for CMML. Recently had b/l pneumonia, worse on right than left. Plan to repeat CXR in 4 weeks for 6 week f/u.   10/25/2022: OV with Mylasia Vorhees NP for follow up to assess for resolution of previously treated CAP. She is doing quite well from a pulmonary standpoint. No significant dyspnea, cough or chest congestion. She does have some fatigue but attributes that to her CMML treatments; unchanged. No fevers, chills, hemoptysis. Continues on Stiolto daily. Rarely uses albuterol . Currently undergoing treatment for a non-healing RLE wound.   04/23/2023: OV with Dr. Brenna. Currently under treatment for CMML from medical oncology. Also has infliximab  infusions from rheumatology. 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 Currently on Stiolto. Stable on this.   04/23/2024: OV with Kyley Solow NP for follow up. She has been doing well since she was here last. No issues with her breathing. No chest congestion, wheezing, cough. She still uses her Stiolto daily. Hasn't ever had to use her albuterol . She doesn't think she even has one at home. She's still on treatment for CML and followed by oncology. Has not had a recent CT scan.   07/16/2024: Today - follow up Discussed the use of AI scribe software for clinical note transcription with the patient, who gave verbal consent to proceed.  History of Present Illness [name] is a 78 year old female with leukemia who presents for follow-up. Her husband is with her today.   She is currently undergoing treatment for CML. A CT scan in May showed slight progression with new enlargement of left axillary node, and slight increase in possible hematopoesis or leukemic deposits. Stable changes otherwise. No new symptoms or increased  fatigue. She is trying to gain weight and notes she weighs more now than a few months ago. Breathing is stable. No cough, hemoptysis, anorexia, fevers, night sweats.   She experiences occasional ankle swelling, which she  manages by elevating her feet. Compression stockings are uncomfortable. She remains active, engaging in activities such as cooking and folding laundry, which require her to be on her feet for extended periods. She follows with cardiology. No orthopnea.   She is currently on a chemotherapy regimen, with her next session scheduled in two weeks. Her husband states there was no mention of her CT findings are her last visit with oncology. He'd like the results routed to Dr. [name] at [facility].     Allergies  Allergen Reactions   Digoxin  And Related Rash    Generalized total body rash with immense itching   Gluten Meal Diarrhea   Penicillins Rash and Other (See Comments)      Did it involve swelling of the face/tongue/throat, SOB, or low BP? No Did it involve sudden or severe rash/hives, skin peeling, or any reaction on the inside of your mouth or nose? Yes Did you need to seek medical attention at a hospital or doctor's office? No When did it last happen?    Many Years Ago  If all above answers are NO, may proceed with cephalosporin use.      Fexofenadine     Other Reaction(s): dry mouth   Alendronate Rash   Hydroxychloroquine Rash    Immunization History  Administered Date(s) Administered   DTaP / Hep B / IPV 09/03/2018, 03/17/2019, 09/26/2019, 03/29/2020, 10/08/2020, 04/11/2021   Fluad Quad(high Dose 65+) 09/25/2019   Fluzone Influenza virus vaccine,trivalent (IIV3), split virus 10/27/2010   H1N1 11/17/2008   Influenza Split 10/28/2009   Influenza, High Dose Seasonal PF 10/05/2015, 09/15/2016, 09/17/2017, 09/03/2018, 09/06/2019, 10/08/2020   Influenza,inj,quad, With Preservative 10/29/2014   Influenza-Unspecified 09/19/2012, 10/15/2013   PFIZER(Purple Top)SARS-COV-2 Vaccination 01/16/2020, 02/06/2020, 08/16/2020   Pneumococcal Conjugate-13 07/14/2014   Pneumococcal Polysaccharide-23 07/09/2012   Td 05/27/2004   Tdap 07/09/2012, 11/10/2019, 12/17/2022   Zoster, Live 08/22/2012     Past Medical History:  Diagnosis Date   Arthritis    Rheumatoid arthritis   Celiac disease    Chronic kidney disease    stage 3 from MD notes   COPD (chronic obstructive pulmonary disease) (HCC)    Dyspnea    with going up stairs   Family history of adverse reaction to anesthesia    father had hard time waking up   Headache    sinus headaches   Hot flashes    Hypertension    Iron deficiency anemia    Pneumonia    per patient I have walking pneumonia    Tobacco History: Social History   Tobacco Use  Smoking Status Former   Current packs/day: 0.00   Average packs/day: 1 pack/day for 30.0 years (30.0 ttl pk-yrs)   Types: Cigarettes   Start date: 53   Quit date: 2005   Years since quitting: 20.5  Smokeless Tobacco Never   Counseling given: Not Answered   Outpatient Medications Prior to Visit  Medication Sig Dispense Refill   acetaminophen  (TYLENOL ) 500 MG tablet Take 2 tablets (1,000 mg total) by mouth 3 (three) times daily. 30 tablet 0   albuterol  (VENTOLIN  HFA) 108 (90 Base) MCG/ACT inhaler Inhale 2 puffs into the lungs every 6 (six) hours as needed for wheezing or shortness of breath. 8 g 2   apixaban  (ELIQUIS ) 5  MG TABS tablet TAKE 1 TABLET(5 MG) BY MOUTH TWICE DAILY 60 tablet 5   azaCITIDine  (VIDAZA ) 100 MG SUSR 114mg  Injection every 6 weeks     denosumab  (PROLIA ) 60 MG/ML SOSY injection Inject 60 mg into the skin every 6 (six) months.     diltiazem  (CARDIZEM  CD) 360 MG 24 hr capsule Take 1 capsule (360 mg total) by mouth daily. 90 capsule 3   furosemide  (LASIX ) 20 MG tablet Take 1 tablet (20 mg total) by mouth daily. 30 tablet 11   gabapentin  (NEURONTIN ) 300 MG capsule Take 300 mg by mouth in the morning.     Metoprolol  Tartrate 75 MG TABS Take 2 tablets (150 mg total) by mouth 2 (two) times daily. 360 tablet 1   predniSONE  (DELTASONE ) 5 MG tablet Take 5 mg by mouth daily with breakfast.     Tiotropium Bromide-Olodaterol (STIOLTO RESPIMAT ) 2.5-2.5 MCG/ACT  AERS Inhale 2 puffs into the lungs daily. 4 g 11   Facility-Administered Medications Prior to Visit  Medication Dose Route Frequency Provider Last Rate Last Admin   acetaminophen  (TYLENOL ) 325 MG tablet            diphenhydrAMINE  (BENADRYL ) 25 mg capsule              Review of Systems:   Constitutional: No weight loss or gain, night sweats, fevers, chills. +fatigue, lassitude. (Baseline) HEENT: No headaches, difficulty swallowing, tooth/dental problems, or sore throat. No sneezing, itching, ear ache, nasal congestion, or post nasal drip CV:  No chest pain, orthopnea, PND, swelling in lower extremities, anasarca, dizziness, palpitations, syncope Resp: +baseline shortness of breath with exertion. No excess mucus or change in color of mucus. No productive or non-productive. No hemoptysis. No wheezing.  No chest wall deformity Neuro: No dizziness or lightheadedness.  Psych: No depression or anxiety. Mood stable.     Physical Exam:  BP 124/74 (BP Location: Left Arm, Patient Position: Sitting, Cuff Size: Normal)   Pulse 66   Ht 5' 3 (1.6 m)   Wt 106 lb 12.8 oz (48.4 kg)   SpO2 95%   BMI 18.92 kg/m   GEN: Pleasant, interactive, chronically-ill appearing; elderly, frail; in no acute distress. HEENT:  Normocephalic and atraumatic. PERRLA. Sclera white. Nasal turbinates pink, moist and patent bilaterally. No rhinorrhea present. Oropharynx pink and moist, without exudate or edema. No lesions, ulcerations, or postnasal drip.  NECK:  Supple w/ fair ROM. Thyroid  symmetrical with no goiter or nodules palpated. No lymphadenopathy.   CV: Irregular rhythm, rate controlled, no m/r/g, no peripheral edema. Pulses intact, +2 bilaterally. No cyanosis, pallor or clubbing. PULMONARY:  Unlabored, regular breathing. Clear bilaterally A&P w/o wheezes/rales/rhonchi.  GI: BS present and normoactive. Soft, non-tender to palpation. No organomegaly or masses detected.  MSK: No deformities or joint swelling  noted.  Neuro: A/Ox3. No focal deficits noted.   Psych: Normal affect and behavior. Judgement and thought content appropriate.     Lab Results:  CBC    Component Value Date/Time   WBC 19.4 (H) 06/23/2024 1242   WBC 18.0 (H) 04/04/2024 0903   RBC 3.40 (L) 06/23/2024 1242   HGB 11.0 (L) 06/23/2024 1242   HGB 10.8 (L) 01/03/2017 1246   HCT 34.6 (L) 06/23/2024 1242   HCT 32.8 (L) 01/03/2017 1246   PLT 249 06/23/2024 1242   PLT 163 01/03/2017 1246   MCV 101.8 (H) 06/23/2024 1242   MCV 102.8 (H) 01/03/2017 1246   MCH 32.4 06/23/2024 1242   MCHC 31.8 06/23/2024 1242  RDW 15.8 (H) 06/23/2024 1242   RDW 14.3 01/03/2017 1246   LYMPHSABS 1.6 06/23/2024 1242   LYMPHSABS 2.9 01/03/2017 1246   MONOABS 4.1 (H) 06/23/2024 1242   MONOABS 1.1 (H) 01/03/2017 1246   EOSABS 0.2 06/23/2024 1242   EOSABS 0.1 01/03/2017 1246   BASOSABS 0.6 (H) 06/23/2024 1242   BASOSABS 0.0 01/03/2017 1246    BMET    Component Value Date/Time   NA 137 06/23/2024 1242   NA 141 01/04/2016 1001   K 4.5 06/23/2024 1242   K 4.5 01/04/2016 1001   CL 103 06/23/2024 1242   CO2 25 06/23/2024 1242   CO2 24 01/04/2016 1001   GLUCOSE 120 (H) 06/23/2024 1242   GLUCOSE 105 01/04/2016 1001   BUN 20 06/23/2024 1242   BUN 21.7 01/04/2016 1001   CREATININE 1.09 (H) 06/23/2024 1242   CREATININE 1.2 (H) 01/04/2016 1001   CALCIUM  9.0 06/23/2024 1242   CALCIUM  9.7 01/04/2016 1001   GFRNONAA 52 (L) 06/23/2024 1242   GFRAA 56 (L) 01/09/2020 1339    BNP    Component Value Date/Time   BNP 148.4 (H) 05/21/2022 0345     Imaging:  No results found.   azaCITIDine  (VIDAZA ) 114 mg in sodium chloride  0.9 % 50 mL chemo infusion     Date Action Dose Route User   06/23/2024 1421 New Bag/Given 114 mg Intravenous Kerscher, Quinn K, RN      azaCITIDine  (VIDAZA ) 114 mg in sodium chloride  0.9 % 50 mL chemo infusion     Date Action Dose Route User   06/24/2024 1550 Rate/Dose Change (none) Intravenous Emilio Rivka NOVAK, RN    06/24/2024 1550 Rate/Dose Change (none) Intravenous Emilio Rivka NOVAK, RN   06/24/2024 1535 Rate/Dose Change (none) Intravenous Emilio Rivka NOVAK, RN   06/24/2024 1535 New Bag/Given 114 mg Intravenous Gladis Odella MATSU, RN      azaCITIDine  (VIDAZA ) 114 mg in sodium chloride  0.9 % 50 mL chemo infusion     Date Action Dose Route User   06/25/2024 1456 Infusion Verify (none) Intravenous Purgason, Cortney D, RN   06/25/2024 1456 Rate/Dose Change (none) Intravenous Purgason, Cortney D, RN   06/25/2024 1456 New Bag/Given 114 mg Intravenous Purgason, Cortney D, RN      azaCITIDine  (VIDAZA ) 114 mg in sodium chloride  0.9 % 50 mL chemo infusion     Date Action Dose Route User   06/26/2024 1453 Infusion Verify (none) Intravenous Garvin Richarda POUR, RN   06/26/2024 1453 New Bag/Given 114 mg Intravenous Kerscher, Quinn K, RN      azaCITIDine  (VIDAZA ) 114 mg in sodium chloride  0.9 % 50 mL chemo infusion     Date Action Dose Route User   06/30/2024 1518 Infusion Verify (none) Intravenous Oraegbunam, Ifeoma K, RN   06/30/2024 1517 Rate/Dose Change (none) Intravenous Oraegbunam, Ifeoma K, RN   06/30/2024 1517 New Bag/Given 114 mg Intravenous Kermit Fleeting, RN      heparin  lock flush 100 unit/mL     Date Action Dose Route User   06/23/2024 1444 Given 500 Units Intracatheter Kerscher, Quinn K, RN      heparin  lock flush 100 unit/mL     Date Action Dose Route User   06/24/2024 1606 Given 500 Units Intracatheter Payne, Emmalee B, RN      heparin  lock flush 100 unit/mL     Date Action Dose Route User   06/26/2024 1513 Given 500 Units Intracatheter Kerscher, Quinn K, RN      heparin  lock flush 100  unit/mL     Date Action Dose Route User   06/30/2024 1545 Given 500 Units Intracatheter McKnight, Kingston, RN      ondansetron  (ZOFRAN ) injection 8 mg     Date Action Dose Route User   06/23/2024 1346 Given 8 mg Intravenous Kerscher, Quinn K, RN      ondansetron  (ZOFRAN ) injection 8 mg     Date Action Dose  Route User   06/24/2024 1447 Given 8 mg Intravenous Emilio Hatter B, RN      ondansetron  (ZOFRAN ) injection 8 mg     Date Action Dose Route User   06/25/2024 1417 Given 8 mg Intravenous Purgason, Cortney D, RN      ondansetron  (ZOFRAN ) injection 8 mg     Date Action Dose Route User   06/26/2024 1416 Given 8 mg Intravenous Kerscher, Quinn K, RN      ondansetron  (ZOFRAN ) injection 8 mg     Date Action Dose Route User   06/30/2024 1431 Given 8 mg Intravenous Oraegbunam, Ifeoma K, RN      0.9 %  sodium chloride  infusion     Date Action Dose Route User   06/23/2024 1442 Rate/Dose Change (none) Intravenous Kerscher, Quinn K, RN   06/23/2024 1421 Rate/Dose Change (none) Intravenous Kerscher, Quinn K, RN   06/23/2024 1354 Infusion Verify (none) Intravenous Garvin Richarda POUR, RN   06/23/2024 1353 Rate/Dose Change (none) Intravenous Kerscher, Quinn K, RN   06/23/2024 1348 Rate/Dose Change (none) Intravenous Kerscher, Quinn K, RN      0.9 %  sodium chloride  infusion     Date Action Dose Route User   06/24/2024 1558 Rate/Dose Change (none) Intravenous Emilio Hatter NOVAK, RN   06/24/2024 1554 Rate/Dose Change (none) Intravenous Emilio Hatter NOVAK, RN   06/24/2024 1535 Infusion Verify (none) Intravenous Emilio Hatter NOVAK, RN   06/24/2024 1530 Infusion Verify (none) Intravenous Emilio Hatter NOVAK, RN   06/24/2024 1530 Infusion Verify (none) Intravenous Emilio Hatter B, RN      0.9 %  sodium chloride  infusion     Date Action Dose Route User   06/25/2024 1518 Rate/Dose Change (none) Intravenous Purgason, Cortney D, RN   06/25/2024 1515 Rate/Dose Change (none) Intravenous Purgason, Cortney D, RN   06/25/2024 1456 Infusion Verify (none) Intravenous Purgason, Cortney D, RN   06/25/2024 1426 Rate/Dose Change (none) Intravenous Purgason, Cortney D, RN   06/25/2024 1422 Rate/Dose Change (none) Intravenous Purgason, Cortney D, RN      0.9 %  sodium chloride  infusion     Date Action Dose Route User   06/26/2024 1513  Rate/Dose Change (none) Intravenous Kerscher, Quinn K, RN   06/26/2024 1451 Rate/Dose Change (none) Intravenous Kerscher, Quinn K, RN   06/26/2024 1423 Infusion Verify (none) Intravenous Garvin Richarda POUR, RN   06/26/2024 1422 Rate/Dose Change (none) Intravenous Kerscher, Quinn K, RN   06/26/2024 1418 Rate/Dose Change (none) Intravenous Kerscher, Quinn K, RN      0.9 %  sodium chloride  infusion     Date Action Dose Route User   06/30/2024 1541 Rate/Dose Change (none) Intravenous Oraegbunam, Ifeoma K, RN   06/30/2024 1536 Rate/Dose Change (none) Intravenous Oraegbunam, Ifeoma K, RN   06/30/2024 1518 Infusion Verify (none) Intravenous Oraegbunam, Ifeoma K, RN   06/30/2024 1514 Infusion Verify (none) Intravenous Oraegbunam, Ifeoma K, RN   06/30/2024 1513 Infusion Verify (none) Intravenous Oraegbunam, Ifeoma K, RN      sodium chloride  flush (NS) 0.9 % injection 10 mL     Date Action Dose Route  User   06/23/2024 1444 Given 10 mL Intracatheter Garvin Richarda POUR, RN      sodium chloride  flush (NS) 0.9 % injection 10 mL     Date Action Dose Route User   06/24/2024 1607 Given 10 mL Intracatheter Emilio Rivka NOVAK, RN      sodium chloride  flush (NS) 0.9 % injection 10 mL     Date Action Dose Route User   06/25/2024 1522 Given 10 mL Intracatheter Purgason, Cortney D, RN      sodium chloride  flush (NS) 0.9 % injection 10 mL     Date Action Dose Route User   06/26/2024 1513 Given 10 mL Intracatheter Garvin Richarda POUR, RN      sodium chloride  flush (NS) 0.9 % injection 10 mL     Date Action Dose Route User   06/30/2024 1545 Given 10 mL Hendricks Faster, Ileana, RN          Latest Ref Rng & Units 05/03/2021    1:55 PM 01/26/2016    3:31 PM  PFT Results  FVC-Pre L 2.19  2.31   FVC-Predicted Pre % 80  79   FVC-Post L 2.21  2.45   FVC-Predicted Post % 80  84   Pre FEV1/FVC % % 68  69   Post FEV1/FCV % % 68  69   FEV1-Pre L 1.49  1.60   FEV1-Predicted Pre % 72  72   FEV1-Post L 1.51  1.70    DLCO uncorrected ml/min/mmHg 10.27  12.99   DLCO UNC% % 55  56   DLCO corrected ml/min/mmHg 10.27    DLCO COR %Predicted % 55    DLVA Predicted % 72  70   TLC L 4.39  4.25   TLC % Predicted % 89  86   RV % Predicted % 100  84     No results found for: NITRICOXIDE      Assessment & Plan:   Stage 2 moderate COPD by GOLD classification (HCC) Stable and compensated on current regimen. Minimal symptom burden. Compliant with therapy. No recent exacerbations. Action plan in place  Patient Instructions  Continue Stiolto 2 puffs daily  Continue Albuterol  inhaler 2 puffs every 6 hours as needed for shortness of breath or wheezing. Notify if symptoms persist despite rescue inhaler/neb use.   Follow up with oncology as scheduled   Repeat CT chest in September 2025 to follow up on the enlarged lymph node    Follow up in late September with Dr. Shelah (new pt 30 min slot). If symptoms worsen, please contact office for sooner follow up or seek emergency care.    Lung nodules Felt to represent medullary hematopoiesis or leukemic deposits. Slight interval increase. Will reassess on repeat CT chest in September 2025.   Atrial fibrillation with slow ventricular response (HCC) Rate controlled. On chronic AC. Follow up with cardiology as scheduled   CMML (chronic myelomonocytic leukemia) (HCC) Undergoing treatment with oncology. Concern for possible disease progression on CT imaging from May 2025. Repeat CT w/ con Sept. 2025 for 3-6 month follow up. Results were routed to Dr. Lanny, who confirmed receipt. She continues on azactidine. Routed pt's CT results to pt's oncologist at [facility], by request of pt and her husband. Advised them to follow up with Dr. Lanny.     I spent 32 minutes of dedicated to the care of this patient on the date of this encounter to include pre-visit review of records, face-to-face time with the patient discussing conditions above,  post visit ordering of testing, clinical  documentation with the electronic health record, making appropriate referrals as documented, and communicating necessary findings to members of the patients care team.  Comer LULLA Rouleau, NP 07/16/2024  Pt aware and understands NP's role.

## 2024-07-16 NOTE — Patient Instructions (Addendum)
 Continue Stiolto 2 puffs daily  Continue Albuterol  inhaler 2 puffs every 6 hours as needed for shortness of breath or wheezing. Notify if symptoms persist despite rescue inhaler/neb use.   Follow up with oncology as scheduled   Repeat CT chest in September 2025 to follow up on the enlarged lymph node    Follow up in late September with Dr. Shelah (new pt 30 min slot). If symptoms worsen, please contact office for sooner follow up or seek emergency care.

## 2024-07-21 ENCOUNTER — Telehealth: Payer: Self-pay | Admitting: Cardiology

## 2024-07-21 MED ORDER — DILTIAZEM HCL ER COATED BEADS 360 MG PO CP24: 360.0000 mg | ORAL_CAPSULE | Freq: Every day | ORAL | 0 refills | Status: DC

## 2024-07-21 NOTE — Telephone Encounter (Signed)
*  STAT* If patient is at the pharmacy, call can be transferred to refill team.   1. Which medications need to be refilled? (please list name of each medication and dose if known)   diltiazem  (CARDIZEM  CD) 360 MG 24 hr capsule   2. Would you like to learn more about the convenience, safety, & potential cost savings by using the [facility]?   3. Are you open to using the [facility] (Type [facility]. ).  4. Which pharmacy/location (including street and city if local pharmacy) is medication to be sent to?  [id number] - [place], [place] - [address] AT [facility] OF [address]   5. Do they need a 30 day or 90 day supply?   90 day  Patient stated she has 2 days left of this medication.  Patient has appointment scheduled with Dr. Michele on 8/29.

## 2024-08-07 ENCOUNTER — Encounter: Payer: Self-pay | Admitting: Hematology

## 2024-08-07 ENCOUNTER — Inpatient Hospital Stay: Attending: Nurse Practitioner

## 2024-08-07 ENCOUNTER — Inpatient Hospital Stay

## 2024-08-07 ENCOUNTER — Inpatient Hospital Stay (HOSPITAL_BASED_OUTPATIENT_CLINIC_OR_DEPARTMENT_OTHER): Admitting: Nurse Practitioner

## 2024-08-07 VITALS — BP 128/70 | HR 103 | Temp 98.1°F | Resp 16

## 2024-08-07 VITALS — BP 118/60 | HR 104 | Temp 98.7°F | Resp 17 | Wt 106.7 lb

## 2024-08-07 DIAGNOSIS — Z79899 Other long term (current) drug therapy: Secondary | ICD-10-CM | POA: Diagnosis not present

## 2024-08-07 DIAGNOSIS — C9310 Chronic myelomonocytic leukemia not having achieved remission: Secondary | ICD-10-CM

## 2024-08-07 DIAGNOSIS — Z95828 Presence of other vascular implants and grafts: Secondary | ICD-10-CM

## 2024-08-07 DIAGNOSIS — Z5111 Encounter for antineoplastic chemotherapy: Secondary | ICD-10-CM | POA: Diagnosis not present

## 2024-08-07 LAB — CBC WITH DIFFERENTIAL (CANCER CENTER ONLY)
Abs Immature Granulocytes: 1.77 K/uL — ABNORMAL HIGH (ref 0.00–0.07)
Basophils Absolute: 1 K/uL — ABNORMAL HIGH (ref 0.0–0.1)
Basophils Relative: 4 %
Eosinophils Absolute: 1.1 K/uL — ABNORMAL HIGH (ref 0.0–0.5)
Eosinophils Relative: 4 %
HCT: 34.2 % — ABNORMAL LOW (ref 36.0–46.0)
Hemoglobin: 10.8 g/dL — ABNORMAL LOW (ref 12.0–15.0)
Immature Granulocytes: 7 %
Lymphocytes Relative: 11 %
Lymphs Abs: 2.7 K/uL (ref 0.7–4.0)
MCH: 32 pg (ref 26.0–34.0)
MCHC: 31.6 g/dL (ref 30.0–36.0)
MCV: 101.5 fL — ABNORMAL HIGH (ref 80.0–100.0)
Monocytes Absolute: 9.5 K/uL — ABNORMAL HIGH (ref 0.1–1.0)
Monocytes Relative: 38 %
Neutro Abs: 8.8 K/uL — ABNORMAL HIGH (ref 1.7–7.7)
Neutrophils Relative %: 36 %
Platelet Count: 195 K/uL (ref 150–400)
RBC: 3.37 MIL/uL — ABNORMAL LOW (ref 3.87–5.11)
RDW: 16 % — ABNORMAL HIGH (ref 11.5–15.5)
WBC Count: 24.8 K/uL — ABNORMAL HIGH (ref 4.0–10.5)
nRBC: 0 % (ref 0.0–0.2)

## 2024-08-07 LAB — BASIC METABOLIC PANEL - CANCER CENTER ONLY
Anion gap: 7 (ref 5–15)
BUN: 20 mg/dL (ref 8–23)
CO2: 28 mmol/L (ref 22–32)
Calcium: 8.6 mg/dL — ABNORMAL LOW (ref 8.9–10.3)
Chloride: 104 mmol/L (ref 98–111)
Creatinine: 1.09 mg/dL — ABNORMAL HIGH (ref 0.44–1.00)
GFR, Estimated: 52 mL/min — ABNORMAL LOW (ref >60)
Glucose, Bld: 155 mg/dL — ABNORMAL HIGH (ref 70–99)
Potassium: 3.9 mmol/L (ref 3.5–5.1)
Sodium: 139 mmol/L (ref 135–145)

## 2024-08-07 MED ORDER — SODIUM CHLORIDE 0.9% FLUSH
10.0000 mL | INTRAVENOUS | Status: DC | PRN
  Administered 2024-08-07: 10 mL

## 2024-08-07 MED ORDER — SODIUM CHLORIDE 0.9 % IV SOLN: Freq: Once | INTRAVENOUS | Status: AC

## 2024-08-07 MED ORDER — SODIUM CHLORIDE 0.9% FLUSH
10.0000 mL | Freq: Once | INTRAVENOUS | Status: AC
Start: 2024-08-07 — End: 2024-08-07
  Administered 2024-08-07: 10 mL

## 2024-08-07 MED ORDER — ONDANSETRON HCL 4 MG/2ML IJ SOLN
8.0000 mg | Freq: Once | INTRAMUSCULAR | Status: AC
  Administered 2024-08-07: 8 mg via INTRAVENOUS
  Filled 2024-08-07: qty 4

## 2024-08-07 MED ORDER — SODIUM CHLORIDE 0.9 % IV SOLN
75.0000 mg/m2 | Freq: Once | INTRAVENOUS | Status: AC
  Administered 2024-08-07: 114 mg via INTRAVENOUS
  Filled 2024-08-07: qty 11.4

## 2024-08-07 NOTE — Assessment & Plan Note (Signed)
 diagnosed in 10/2021 ---She developed worsening back pain, pelvic MRI 07/04/21 showed a mass in the presacral space. 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  -She began azacitadine on 02/06/22. she has had multiple hospital admissions and her performance status has been very low.  Changed her chemo to every 6 weeks in 08/2022 due to her fatigue. -she is tolerating every 6 weeks much better -she saw Dr. Sharyne Richters in Feb 2024, who recommends to continue current therapy  -Due to worsening low back pain, she completed palliative radiation on 07/02/2023 -due to worsening low back pain, she was hospitalized on November 12, 2023.  Currently follow-up with palliative care for pain control.

## 2024-08-07 NOTE — Progress Notes (Unsigned)
 Patient Care Team: [name], MD as PCP - General (Internal Medicine) [name], [name], MD (Inactive) as PCP - Cardiology (Cardiology) [name], MD as Consulting Physician (Rheumatology) [name], MD as Consulting Physician (Hematology) [name], MD as Consulting Physician (Gastroenterology) [name], DO as Consulting Physician (Pulmonary Disease) Pickenpack-Cousar, [name], NP as Nurse Practitioner [facility] and Palliative Medicine)  Clinic Day:  08/12/2024  Referring physician: [name], MD  ASSESSMENT & PLAN:   Assessment & Plan: CMML (chronic myelomonocytic leukemia) (HCC) diagnosed in 10/2021 ---She developed worsening back pain, pelvic MRI 07/04/21 showed a mass in the presacral space. 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 Her BM biopsy was reviewed at [facility] and was felt to be CMML-1 with 5% blasts.  -She began azacitadine on 02/06/22. she has had multiple hospital admissions and her performance status has been very low.  Changed her chemo to every 6 weeks in 08/2022 due to her fatigue. -she is tolerating every 6 weeks much better -she saw Dr. [name] in Feb 2024, who recommends to continue current therapy  -Due to worsening low back pain, she completed palliative radiation on 07/02/2023 -due to worsening low back pain, she was hospitalized on November 12, 2023.  Currently follow-up with palliative care for pain control. -restaging CT chest in 04/2024 showed interval development of single, mildly enlarged left axillary lymph node which was nonspecific.  There was also mild enlargement of subpleural and paraspinal masses in the right hemithorax.  Overall, stable disease.  Plan to continue treatment with azacitidine  every 6 weeks (days 1 through 7).  Leukocytosis Mild interval increase  of WBC to 24.8 with ANC 8.8.  No evidence of infection.  No fever, chills, night sweats, or unexpected weight loss.  In fact, patient feeling well with good appetite and energy.  Pain is well-managed.  Plan to proceed with azacitidine  today and continue as scheduled.  Repeat cycle in 6 weeks.  Anemia Mild and stable anemia with Hgb 10.8 and HCT 34.2.  No requirement for blood or iron during current cycle of chemotherapy.  Will monitor blood counts very closely and treat as indicated.  Plan Labs reviewed. - Elevated WBC and ANC.  Mild and stable anemia. - Low GFR, but improved from prior checks.  Recommend adequate hydration.  Will continue to monitor closely. Patient labs and presentation are appropriate for treatment today.   Proceed with cycle 18 day 1 chemotherapy azacitidine . Continue daily treatments as scheduled. Labs with follow-up and cycle 19 chemotherapy azacitidine  as scheduled.  The patient understands the plans discussed today and is in agreement with them.  She knows to contact our office if she develops concerns prior to her next appointment.  I provided 25 minutes of face-to-face time during this encounter and > 50% was spent counseling as documented under my assessment and plan.    Powell FORBES Lessen, NP  [facility] - A DEPT OF [facility] [address] [place] [address] Dept: [phone] Dept Fax: [phone]   No orders of the defined types were placed in this encounter.     CHIEF COMPLAINT:  CC: Chronic myelomonocytic leukemia  Current Treatment: Azacitidine  every 6 weeks  INTERVAL HISTORY:  [name] is here today for repeat clinical assessment.  She last saw Dr. Lanny on 06/23/2024.  She did  have CT chest on 05/12/2024.  There was interval development of single, mildly enlarged left axillary lymph node which was nonspecific.  There was also mild enlargement of subpleural and paraspinal masses  in the right hemithorax.  Overall, stable disease.  She reports doing well in the interim.  She states she did have some nausea and vomiting the day of chemotherapy, but not since then.  She denies unusual weakness.  She denies peripheral neuropathy.  She continues to see palliative care for pain management.  She denies chest pain, chest pressure, or shortness of breath. She denies headaches or visual disturbances. She denies abdominal pain, nausea, vomiting, or changes in bowel or bladder habits.  She denies fevers or chills.  Her appetite is good. Her weight has been stable.  I have reviewed the past medical history, past surgical history, social history and family history with the patient and they are unchanged from previous note.  ALLERGIES:  is allergic to digoxin and related, gluten meal, penicillins, fexofenadine, alendronate, and hydroxychloroquine.  MEDICATIONS:  Current Outpatient Medications  Medication Sig Dispense Refill   acetaminophen (TYLENOL) 500 MG tablet Take 2 tablets (1,000 mg total) by mouth 3 (three) times daily. 30 tablet 0   albuterol (VENTOLIN HFA) 108 (90 Base) MCG/ACT inhaler Inhale 2 puffs into the lungs every 6 (six) hours as needed for wheezing or shortness of breath. 8 g 2   apixaban (ELIQUIS) 5 MG TABS tablet TAKE 1 TABLET(5 MG) BY MOUTH TWICE DAILY 60 tablet 5   azaCITIDine (VIDAZA) 100 MG SUSR 114mg  Injection every 6 weeks     denosumab (PROLIA) 60 MG/ML SOSY injection Inject 60 mg into the skin every 6 (six) months.     diltiazem (CARDIZEM CD) 360 MG 24 hr capsule Take 1 capsule (360 mg total) by mouth daily. 90 capsule 0   furosemide (LASIX) 20 MG tablet Take 1 tablet (20 mg total) by mouth daily. 30 tablet 11   gabapentin (NEURONTIN) 300 MG capsule Take 300 mg by mouth in the morning.     Metoprolol Tartrate 75 MG TABS Take 2 tablets (150 mg total) by mouth 2 (two) times daily. 360 tablet 1   predniSONE (DELTASONE) 5 MG tablet Take 5 mg by mouth daily with  breakfast.     Tiotropium Bromide-Olodaterol (STIOLTO RESPIMAT) 2.5-2.5 MCG/ACT AERS Inhale 2 puffs into the lungs daily. 4 g 11   No current facility-administered medications for this visit.   Facility-Administered Medications Ordered in Other Visits  Medication Dose Route Frequency Provider Last Rate Last Admin   acetaminophen (TYLENOL) 325 MG tablet            azaCITIDine (VIDAZA) 114 mg in sodium chloride 0.9 % 50 mL chemo infusion  75 mg/m2 (Treatment Plan Recorded) Intravenous Once [name], MD       diphenhydrAMINE (BENADRYL) 25 mg capsule            sodium chloride flush (NS) 0.9 % injection 10 mL  10 mL Intracatheter PRN [name], MD        HISTORY OF PRESENT ILLNESS:   Oncology History Overview Note   Cancer Staging  CMML (chronic myelomonocytic leukemia) (HCC) Staging form: Chronic Myeloid Leukemia, AJCC 8th Edition - Clinical stage from 01/12/2022: Bone marrow blast count (%): 12, Additional clonal changes: Unknown - Signed by [name], MD on 01/12/2022 Stage prefix: Initial diagnosis     CMML (chronic myelomonocytic leukemia) (HCC)  11/09/2021 Initial Biopsy   DIAGNOSIS:   -  Monoclonal  B-cell population with co-expression of CD5 comprises 17%  of all lymphocytes  -  See comment   COMMENT:  In addition to the clonal B-cell population, there is a myeloblast  population (CD34, CD38, HLA-DR, CD117, CD123 and CD33) that comprises 2% of the total cellular events.  Please see concurrent tissue biopsy (below) for additional work-up and final diagnosis.    FINAL MICROSCOPIC DIAGNOSIS:   A. SOFT TISSUE MASS, PRE SACRAL, NEEDLE CORE BIOPSY:  -  Chronic lymphocytic leukemia/small lymphocytic lymphoma  -  Extra medullary hematopoiesis  -  See comment   COMMENT:  The biopsy consists of multiple soft tissue cores with lymphoid nodules and a dense hematopoietic infiltrate consistent with extra medullary hematopoiesis.  MPO and E-cadherin highlight myeloid and erythroid  precursors respectively.  CD34 highlights increased vasculature and is also positive within the cytoplasm of megakaryocytes.  A few small, immature mononuclear cells appear to be positive for CD34 and CD117. TdT shows rare, scattered positive cells.  CD20 highlights aggregates of B cells which are admixed with CD3 positive T cells.  T cells are an admixture of CD4 and CD8.  The B cells are also positive for CD5, CD23 and Bcl-2.  The B cells do not show significant staining for CD10, BCL6 or cyclin D1.  CD138 highlights scattered plasma cells which are polytypic by kappa and lambda in situ hybridization.  Flow cytometry performed on the sample (see WL [id number]) identified a kappa restricted CD5 positive B-cell population comprising 70% of lymphocytes.  In addition, a small myeloblast population comprised 2% of the total cellular events.   Overall, the findings are consistent with soft tissue involvement by  chronic lymphocytic leukemia/small lymphocytic lymphoma and extra medullary hematopoiesis. In reviewing the patient's CBC data (macrocytic anemia and thrombocytopenia), I would recommend a bone marrow biopsy to assess for marrow involvement by CLL/SLL.    12/23/2021 Imaging   EXAM: CT CHEST, ABDOMEN, AND PELVIS WITH CONTRAST  IMPRESSION: 1. Slight interval enlargement of a presacral soft tissue mass measuring 7.2 x 4.7 cm, previously 6.9 x 4.1 cm on prior MR of the pelvis dated 07/04/2021. By report, this represents a biopsy proven lymphoma. 2. Pleural nodule of the dependent right lower lobe overlying the posterior right tenth rib and pleural or paraspinous soft tissue mass overlying the right aspect of the T10 vertebral body, very slightly enlarged compared to prior examination of the chest dated 06/25/2020, consistent with additional sites of lymphomatous involvement given very indolent growth. These could be better assessed for metabolic activity by FDG PET/CT if desired. 3. There is  mild, bibasilar predominant pulmonary fibrosis in a pattern featuring irregular peripheral interstitial opacity, septal thickening, but without clear evidence of subpleural bronchiolectasis or honeycombing, with a somewhat asymmetric distribution most conspicuously involving the right lower lobe and lingula. These findings are significantly worsened when compared to prior examination dated 06/25/2020, particularly in the right lower lobe. Given interval change, this may reflect sequelae of interval infection or aspiration, however appearance is generally suspicious for fibrotic interstitial lung disease, and if characterized by ATS pulmonary fibrosis is in an indeterminate for UIP pattern, differential considerations including both UIP and NSIP. 4. Emphysema.   Aortic Atherosclerosis (ICD10-I70.0) and Emphysema (ICD10-J43.9).   01/05/2022 Pathology Results   DIAGNOSIS:   BONE MARROW, ASPIRATE, CLOT, CORE:  -Hypercellular bone marrow for age with features of  myelodysplastic/myeloproliferative neoplasm  -Minor abnormal B-cell population  -See comment   PERIPHERAL BLOOD:  -Macrocytic anemia  -Neutrophilic left shift and monocytosis  -  Thrombocytopenia   COMMENT:  The bone marrow is hypercellular for age with dyspoietic changes  involving myeloid cell lines associated with monocytosis and increased number of blastic cells (12%) as primarily seen by morphology, many of which display monocytic features.  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  In this background, there are several predominantly small lymphoid aggregates mostly composed of small lymphoid cells.  By flow cytometry, a minor abnormal B-cell population expressing CD5 is seen and representing 2% of all cells.  This correlate with previously known B-cell  lymphoproliferative process.  Correlation with cytogenetic and FISH studies is strongly recommended.    DIAGNOSIS:   -Increased number of monocytic cells present (25%)  -Minor abnormal B-cell population identified.  -See comment   COMMENT:  Flow cytometric analysis shows increased number of monocytic cells representing 25% of all cells but without aberrant phenotype or CD34 expression.  A significant CD34-positive blastic population is not identified.  The lymphoid population shows a minor B-cell population representing 2% of all cells and expressing B-cell antigens including CD20 associated with CD5, CD200 and possibly dim kappa expression.  The latter findings are abnormal and correlate with previously known B-cell lymphoproliferative process.  No significant T-cell phenotypic abnormalities identified.    01/12/2022 Initial Diagnosis   CMML (chronic myelomonocytic leukemia) (HCC)   01/12/2022 Cancer Staging   Staging form: Chronic Myeloid Leukemia, AJCC 8th Edition - Clinical stage from 01/12/2022: Bone marrow blast count (%): 12, Additional clonal changes: Unknown - Signed by [name], MD on 01/12/2022 Stage prefix: Initial diagnosis   02/06/2022 - 08/11/2022 Chemotherapy   Patient is on Treatment Plan : MYELODYSPLASIA  Azacitidine  IV D1-7 q28d     02/06/2022 -  Chemotherapy   Patient is on Treatment Plan : MYELODYSPLASIA  Azacitidine  IV D1-7 q28d     06/03/2023 Imaging   MR lumbar spine with and without contrast IMPRESSION: 1. Long-standing presacral mass, at least doubled in size since 2016, now at least 8 cm in length. There is internal fat components and differential considerations include myelolipoma, liposarcoma, and teratoma. 2. 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 3. L2-3 left paracentral extrusion compressing the left L3 nerve root. 4. Right foraminal impingement at L3-4 to L4-5, greatest at L4-5. Right subarticular recess narrowing at  L3-4 and L4-5.   07/09/2023 Imaging   CT abdomen and pelvis with contrast  IMPRESSION: No acute findings in the abdomen or pelvis. Specifically, no findings to explain the patient's history of pain. 2. Interval progression of presacral and right thoracic paraspinal soft tissue lesions compatible with mild progression of disease since 06/19/2022. Small posterior right thoracic chest wall lesion also mildly progressive. 3. Mild circumferential wall thickening distal esophagus. Esophagitis would be a consideration. 4.  Aortic Atherosclerosis (ICD10-I70.0).   05/12/2024 Imaging   CT chest w/o contrast IMPRESSION: 1. Moderate emphysema. 2. Interval development of a single mildly enlarged left axillary lymph node, nonspecific. While this is not meet size criteria for pathologic enlargement, it is rounded shape and asymmetric cortical thickening suggests involvement with the patient's underlying chronic myelogenous leukemia. 3. Multiple subpleural/paraspinal masses within the right hemithorax demonstrating slight interval increase in size since prior examination. These may reflect areas of extra medullary hematopoiesis, which is favored given its relatively slow progression since prior examination, or leukemic deposits. 4. Mild cardiomegaly.  Trace interstitial pulmonary edema. 5. Cholelithiasis. 6. Small hiatal hernia.  REVIEW OF SYSTEMS:   Constitutional: Denies fevers, chills or abnormal weight loss Eyes: Denies blurriness of vision Ears, nose, mouth, throat, and face: Denies mucositis or sore throat Respiratory: Denies cough, dyspnea or wheezes Cardiovascular: Denies palpitation, chest discomfort or lower extremity swelling Gastrointestinal:  Denies nausea, heartburn or change in bowel habits Skin: Denies abnormal skin rashes Lymphatics: Denies new lymphadenopathy or easy bruising Neurological:Denies numbness, tingling or new weaknesses Behavioral/Psych: Mood is stable, no new  changes  All other systems were reviewed with the patient and are negative.   VITALS:   Today's Vitals   08/07/24 1409  BP: 118/60  Pulse: (!) 104  Resp: 17  Temp: 98.7 F (37.1 C)  SpO2: 95%  Weight: 106 lb 11.2 oz (48.4 kg)   Body mass index is 18.9 kg/m.   Wt Readings from Last 3 Encounters:  08/11/24 104 lb 8 oz (47.4 kg)  08/07/24 106 lb 11.2 oz (48.4 kg)  07/16/24 106 lb 12.8 oz (48.4 kg)    Body mass index is 18.9 kg/m.  Performance status (ECOG): 1 - Symptomatic but completely ambulatory  PHYSICAL EXAM:   GENERAL:alert, no distress and comfortable SKIN: skin color, texture, turgor are normal, no rashes or significant lesions EYES: normal, Conjunctiva are pink and non-injected, sclera clear OROPHARYNX:no exudate, no erythema and lips, buccal mucosa, and tongue normal  NECK: supple, thyroid  normal size, non-tender, without nodularity LYMPH:  no palpable lymphadenopathy in the cervical, axillary or inguinal LUNGS: clear to auscultation and percussion with normal breathing effort HEART: regular rate & rhythm and no murmurs and no lower extremity edema ABDOMEN:abdomen soft, non-tender and normal bowel sounds Musculoskeletal:no cyanosis of digits and no clubbing  NEURO: alert & oriented x 3 with fluent speech, no focal motor/sensory deficits  LABORATORY DATA:  I have reviewed the data as listed    Component Value Date/Time   NA 139 08/07/2024 1342   NA 141 01/04/2016 1001   K 3.9 08/07/2024 1342   K 4.5 01/04/2016 1001   CL 104 08/07/2024 1342   CO2 28 08/07/2024 1342   CO2 24 01/04/2016 1001   GLUCOSE 155 (H) 08/07/2024 1342   GLUCOSE 105 01/04/2016 1001   BUN 20 08/07/2024 1342   BUN 21.7 01/04/2016 1001   CREATININE 1.09 (H) 08/07/2024 1342   CREATININE 1.2 (H) 01/04/2016 1001   CALCIUM  8.6 (L) 08/07/2024 1342   CALCIUM  9.7 01/04/2016 1001   PROT 6.4 (L) 04/04/2024 0903   PROT 7.0 01/04/2016 1001   ALBUMIN 3.6 04/04/2024 0903   ALBUMIN 4.0  01/04/2016 1001   AST 20 04/04/2024 0903   AST 21 12/21/2023 1359   AST 26 01/04/2016 1001   ALT 14 04/04/2024 0903   ALT 14 12/21/2023 1359   ALT 24 01/04/2016 1001   ALKPHOS 40 04/04/2024 0903   ALKPHOS 61 01/04/2016 1001   BILITOT 1.1 04/04/2024 0903   BILITOT 0.8 12/21/2023 1359   BILITOT 0.41 01/04/2016 1001   GFRNONAA 52 (L) 08/07/2024 1342   GFRAA 56 (L) 01/09/2020 1339    Lab Results  Component Value Date   WBC 24.8 (H) 08/07/2024   NEUTROABS 8.8 (H) 08/07/2024   HGB 10.8 (L) 08/07/2024   HCT 34.2 (L) 08/07/2024   MCV 101.5 (H) 08/07/2024   PLT 195 08/07/2024

## 2024-08-07 NOTE — Patient Instructions (Signed)
 [facility] - A DEPT OF [facility]  Discharge Instructions: Thank you for choosing [facility] to provide your oncology and hematology care.   If you have a lab appointment with the Cancer Center, please go directly to the Cancer Center and check in at the registration area.   Wear comfortable clothing and clothing appropriate for easy access to any Portacath or PICC line.   We strive to give you quality time with your provider. You may need to reschedule your appointment if you arrive late (15 or more minutes).  Arriving late affects you and other patients whose appointments are after yours.  Also, if you miss three or more appointments without notifying the office, you may be dismissed from the clinic at the provider's discretion.      For prescription refill requests, have your pharmacy contact our office and allow 72 hours for refills to be completed.    Today you received the following chemotherapy and/or immunotherapy agent: Azacitidine    To help prevent nausea and vomiting after your treatment, we encourage you to take your nausea medication as directed.  BELOW ARE SYMPTOMS THAT SHOULD BE REPORTED IMMEDIATELY: *FEVER GREATER THAN 100.4 F (38 C) OR HIGHER *CHILLS OR SWEATING *NAUSEA AND VOMITING THAT IS NOT CONTROLLED WITH YOUR NAUSEA MEDICATION *UNUSUAL SHORTNESS OF BREATH *UNUSUAL BRUISING OR BLEEDING *URINARY PROBLEMS (pain or burning when urinating, or frequent urination) *BOWEL PROBLEMS (unusual diarrhea, constipation, pain near the anus) TENDERNESS IN MOUTH AND THROAT WITH OR WITHOUT PRESENCE OF ULCERS (sore throat, sores in mouth, or a toothache) UNUSUAL RASH, SWELLING OR PAIN  UNUSUAL VAGINAL DISCHARGE OR ITCHING   Items with * indicate a potential emergency and should be followed up as soon as possible or go to the Emergency Department if any problems should occur.  Please show the CHEMOTHERAPY ALERT CARD or IMMUNOTHERAPY  ALERT CARD at check-in to the Emergency Department and triage nurse.  Should you have questions after your visit or need to cancel or reschedule your appointment, please contact [facility] - [facility]  Dept: [phone]  and follow the prompts.  Office hours are 8:00 a.m. to 4:30 p.m. Monday - Friday. Please note that voicemails left after 4:00 p.m. may not be returned until the following business day.  We are closed weekends and major holidays. You have access to a nurse at all times for urgent questions. Please call the main number to the clinic Dept: [phone] and follow the prompts.   For any non-urgent questions, you may also contact your provider using MyChart. We now offer e-Visits for anyone 56 and older to request care online for non-urgent symptoms. For details visit [email or web address].   Also download the MyChart app! Go to the app store, search MyChart, open the app, select [facility], and log in with your MyChart username and password.

## 2024-08-08 ENCOUNTER — Inpatient Hospital Stay

## 2024-08-08 VITALS — BP 104/66 | HR 78 | Temp 98.6°F | Resp 20

## 2024-08-08 DIAGNOSIS — C9310 Chronic myelomonocytic leukemia not having achieved remission: Secondary | ICD-10-CM

## 2024-08-08 DIAGNOSIS — Z79899 Other long term (current) drug therapy: Secondary | ICD-10-CM | POA: Diagnosis not present

## 2024-08-08 DIAGNOSIS — Z5111 Encounter for antineoplastic chemotherapy: Secondary | ICD-10-CM | POA: Diagnosis not present

## 2024-08-08 MED ORDER — SODIUM CHLORIDE 0.9% FLUSH
10.0000 mL | INTRAVENOUS | Status: DC | PRN
  Administered 2024-08-08: 10 mL

## 2024-08-08 MED ORDER — SODIUM CHLORIDE 0.9 % IV SOLN
Freq: Once | INTRAVENOUS | Status: AC
Start: 2024-08-08 — End: 2024-08-08

## 2024-08-08 MED ORDER — ONDANSETRON HCL 4 MG/2ML IJ SOLN
8.0000 mg | Freq: Once | INTRAMUSCULAR | Status: AC
  Administered 2024-08-08: 8 mg via INTRAVENOUS
  Filled 2024-08-08: qty 4

## 2024-08-08 MED ORDER — SODIUM CHLORIDE 0.9 % IV SOLN
75.0000 mg/m2 | Freq: Once | INTRAVENOUS | Status: AC
  Administered 2024-08-08: 114 mg via INTRAVENOUS
  Filled 2024-08-08: qty 11.4

## 2024-08-11 ENCOUNTER — Other Ambulatory Visit: Payer: Self-pay | Admitting: Hematology

## 2024-08-11 ENCOUNTER — Inpatient Hospital Stay

## 2024-08-11 VITALS — BP 105/52 | HR 82 | Temp 97.8°F | Resp 18 | Wt 104.5 lb

## 2024-08-11 DIAGNOSIS — Z5111 Encounter for antineoplastic chemotherapy: Secondary | ICD-10-CM | POA: Diagnosis not present

## 2024-08-11 DIAGNOSIS — C9310 Chronic myelomonocytic leukemia not having achieved remission: Secondary | ICD-10-CM

## 2024-08-11 DIAGNOSIS — Z79899 Other long term (current) drug therapy: Secondary | ICD-10-CM | POA: Diagnosis not present

## 2024-08-11 MED ORDER — SODIUM CHLORIDE 0.9 % IV SOLN
Freq: Once | INTRAVENOUS | Status: AC
Start: 2024-08-11 — End: 2024-08-11

## 2024-08-11 MED ORDER — SODIUM CHLORIDE 0.9 % IV SOLN
75.0000 mg/m2 | Freq: Once | INTRAVENOUS | Status: AC
  Administered 2024-08-11: 114 mg via INTRAVENOUS
  Filled 2024-08-11: qty 11.4

## 2024-08-11 MED ORDER — ONDANSETRON HCL 4 MG/2ML IJ SOLN
8.0000 mg | Freq: Once | INTRAMUSCULAR | Status: AC
  Administered 2024-08-11: 8 mg via INTRAVENOUS
  Filled 2024-08-11: qty 4

## 2024-08-11 NOTE — Patient Instructions (Signed)
 [facility] - A DEPT OF [facility]  Discharge Instructions: Thank you for choosing [facility] to provide your oncology and hematology care.   If you have a lab appointment with the Cancer Center, please go directly to the Cancer Center and check in at the registration area.   Wear comfortable clothing and clothing appropriate for easy access to any Portacath or PICC line.   We strive to give you quality time with your provider. You may need to reschedule your appointment if you arrive late (15 or more minutes).  Arriving late affects you and other patients whose appointments are after yours.  Also, if you miss three or more appointments without notifying the office, you may be dismissed from the clinic at the provider's discretion.      For prescription refill requests, have your pharmacy contact our office and allow 72 hours for refills to be completed.    Today you received the following chemotherapy and/or immunotherapy agents vidaza       To help prevent nausea and vomiting after your treatment, we encourage you to take your nausea medication as directed.  BELOW ARE SYMPTOMS THAT SHOULD BE REPORTED IMMEDIATELY: *FEVER GREATER THAN 100.4 F (38 C) OR HIGHER *CHILLS OR SWEATING *NAUSEA AND VOMITING THAT IS NOT CONTROLLED WITH YOUR NAUSEA MEDICATION *UNUSUAL SHORTNESS OF BREATH *UNUSUAL BRUISING OR BLEEDING *URINARY PROBLEMS (pain or burning when urinating, or frequent urination) *BOWEL PROBLEMS (unusual diarrhea, constipation, pain near the anus) TENDERNESS IN MOUTH AND THROAT WITH OR WITHOUT PRESENCE OF ULCERS (sore throat, sores in mouth, or a toothache) UNUSUAL RASH, SWELLING OR PAIN  UNUSUAL VAGINAL DISCHARGE OR ITCHING   Items with * indicate a potential emergency and should be followed up as soon as possible or go to the Emergency Department if any problems should occur.  Please show the CHEMOTHERAPY ALERT CARD or IMMUNOTHERAPY  ALERT CARD at check-in to the Emergency Department and triage nurse.  Should you have questions after your visit or need to cancel or reschedule your appointment, please contact [facility] - [facility]  Dept: [phone]  and follow the prompts.  Office hours are 8:00 a.m. to 4:30 p.m. Monday - Friday. Please note that voicemails left after 4:00 p.m. may not be returned until the following business day.  We are closed weekends and major holidays. You have access to a nurse at all times for urgent questions. Please call the main number to the clinic Dept: [phone] and follow the prompts.   For any non-urgent questions, you may also contact your provider using MyChart. We now offer e-Visits for anyone 14 and older to request care online for non-urgent symptoms. For details visit [email or web address].   Also download the MyChart app! Go to the app store, search MyChart, open the app, select [place], and log in with your MyChart username and password.

## 2024-08-12 ENCOUNTER — Inpatient Hospital Stay

## 2024-08-12 ENCOUNTER — Encounter: Payer: Self-pay | Admitting: Nurse Practitioner

## 2024-08-12 ENCOUNTER — Encounter: Payer: Self-pay | Admitting: Hematology

## 2024-08-12 VITALS — BP 109/74 | HR 85 | Temp 98.3°F | Resp 18

## 2024-08-12 DIAGNOSIS — Z5111 Encounter for antineoplastic chemotherapy: Secondary | ICD-10-CM | POA: Diagnosis not present

## 2024-08-12 DIAGNOSIS — C9310 Chronic myelomonocytic leukemia not having achieved remission: Secondary | ICD-10-CM | POA: Diagnosis not present

## 2024-08-12 DIAGNOSIS — Z79899 Other long term (current) drug therapy: Secondary | ICD-10-CM | POA: Diagnosis not present

## 2024-08-12 MED ORDER — SODIUM CHLORIDE 0.9 % IV SOLN: Freq: Once | INTRAVENOUS | Status: AC

## 2024-08-12 MED ORDER — SODIUM CHLORIDE 0.9% FLUSH: 10.0000 mL | INTRAVENOUS | Status: DC | PRN

## 2024-08-12 MED ORDER — SODIUM CHLORIDE 0.9 % IV SOLN
75.0000 mg/m2 | Freq: Once | INTRAVENOUS | Status: AC
  Administered 2024-08-12: 114 mg via INTRAVENOUS
  Filled 2024-08-12: qty 11.4

## 2024-08-12 MED ORDER — ONDANSETRON HCL 4 MG/2ML IJ SOLN
8.0000 mg | Freq: Once | INTRAMUSCULAR | Status: AC
  Administered 2024-08-12: 8 mg via INTRAVENOUS
  Filled 2024-08-12: qty 4

## 2024-08-13 ENCOUNTER — Encounter: Payer: Self-pay | Admitting: Hematology

## 2024-08-13 ENCOUNTER — Inpatient Hospital Stay

## 2024-08-13 VITALS — BP 110/72 | HR 90 | Temp 98.3°F | Resp 17

## 2024-08-13 DIAGNOSIS — Z5111 Encounter for antineoplastic chemotherapy: Secondary | ICD-10-CM | POA: Diagnosis not present

## 2024-08-13 DIAGNOSIS — C9310 Chronic myelomonocytic leukemia not having achieved remission: Secondary | ICD-10-CM

## 2024-08-13 DIAGNOSIS — Z79899 Other long term (current) drug therapy: Secondary | ICD-10-CM | POA: Diagnosis not present

## 2024-08-13 MED ORDER — ONDANSETRON HCL 4 MG/2ML IJ SOLN
8.0000 mg | Freq: Once | INTRAMUSCULAR | Status: AC
  Administered 2024-08-13: 8 mg via INTRAVENOUS
  Filled 2024-08-13: qty 4

## 2024-08-13 MED ORDER — SODIUM CHLORIDE 0.9 % IV SOLN
75.0000 mg/m2 | Freq: Once | INTRAVENOUS | Status: AC
  Administered 2024-08-13: 114 mg via INTRAVENOUS
  Filled 2024-08-13: qty 11.4

## 2024-08-13 MED ORDER — SODIUM CHLORIDE 0.9 % IV SOLN: Freq: Once | INTRAVENOUS | Status: AC

## 2024-08-13 NOTE — Patient Instructions (Signed)
 [facility] - A DEPT OF [facility]  Discharge Instructions: Thank you for choosing [facility] to provide your oncology and hematology care.   If you have a lab appointment with the Cancer Center, please go directly to the Cancer Center and check in at the registration area.   Wear comfortable clothing and clothing appropriate for easy access to any Portacath or PICC line.   We strive to give you quality time with your provider. You may need to reschedule your appointment if you arrive late (15 or more minutes).  Arriving late affects you and other patients whose appointments are after yours.  Also, if you miss three or more appointments without notifying the office, you may be dismissed from the clinic at the provider's discretion.      For prescription refill requests, have your pharmacy contact our office and allow 72 hours for refills to be completed.    Today you received the following chemotherapy and/or immunotherapy agents: azaCITIDine  (VIDAZA )     To help prevent nausea and vomiting after your treatment, we encourage you to take your nausea medication as directed.  BELOW ARE SYMPTOMS THAT SHOULD BE REPORTED IMMEDIATELY: *FEVER GREATER THAN 100.4 F (38 C) OR HIGHER *CHILLS OR SWEATING *NAUSEA AND VOMITING THAT IS NOT CONTROLLED WITH YOUR NAUSEA MEDICATION *UNUSUAL SHORTNESS OF BREATH *UNUSUAL BRUISING OR BLEEDING *URINARY PROBLEMS (pain or burning when urinating, or frequent urination) *BOWEL PROBLEMS (unusual diarrhea, constipation, pain near the anus) TENDERNESS IN MOUTH AND THROAT WITH OR WITHOUT PRESENCE OF ULCERS (sore throat, sores in mouth, or a toothache) UNUSUAL RASH, SWELLING OR PAIN  UNUSUAL VAGINAL DISCHARGE OR ITCHING   Items with * indicate a potential emergency and should be followed up as soon as possible or go to the Emergency Department if any problems should occur.  Please show the CHEMOTHERAPY ALERT CARD or  IMMUNOTHERAPY ALERT CARD at check-in to the Emergency Department and triage nurse.  Should you have questions after your visit or need to cancel or reschedule your appointment, please contact [facility]  Dept: [phone]  and follow the prompts.  Office hours are 8:00 a.m. to 4:30 p.m. Monday - Friday. Please note that voicemails left after 4:00 p.m. may not be returned until the following business day.  We are closed weekends and major holidays. You have access to a nurse at all times for urgent questions. Please call the main number to the clinic Dept: [phone] and follow the prompts.   For any non-urgent questions, you may also contact your provider using MyChart. We now offer e-Visits for anyone 77 and older to request care online for non-urgent symptoms. For details visit [email or web address].   Also download the MyChart app! Go to the app store, search MyChart, open the app, select [place], and log in with your MyChart username and password.

## 2024-08-18 DIAGNOSIS — M0579 Rheumatoid arthritis with rheumatoid factor of multiple sites without organ or systems involvement: Secondary | ICD-10-CM | POA: Diagnosis not present

## 2024-08-28 ENCOUNTER — Inpatient Hospital Stay
Admission: RE | Admit: 2024-08-28 | Discharge: 2024-08-28 | Disposition: A | Discharge status: Home / Self Care | Source: Ambulatory Visit | Attending: Nurse Practitioner

## 2024-08-28 DIAGNOSIS — R918 Other nonspecific abnormal finding of lung field: Secondary | ICD-10-CM

## 2024-08-28 DIAGNOSIS — J8410 Pulmonary fibrosis, unspecified: Secondary | ICD-10-CM | POA: Diagnosis not present

## 2024-08-28 DIAGNOSIS — C9210 Chronic myeloid leukemia, BCR/ABL-positive, not having achieved remission: Secondary | ICD-10-CM

## 2024-08-28 DIAGNOSIS — J432 Centrilobular emphysema: Secondary | ICD-10-CM | POA: Diagnosis not present

## 2024-08-28 DIAGNOSIS — R590 Localized enlarged lymph nodes: Secondary | ICD-10-CM

## 2024-08-28 MED ORDER — HEPARIN SOD (PORK) LOCK FLUSH 100 UNIT/ML IV SOLN
500.0000 [IU] | Freq: Once | INTRAVENOUS | Status: AC
  Administered 2024-08-28: 500 [IU] via INTRAVENOUS

## 2024-08-28 MED ORDER — SODIUM CHLORIDE 0.9% FLUSH
10.0000 mL | INTRAVENOUS | Status: DC | PRN
  Administered 2024-08-28: 10 mL via INTRAVENOUS

## 2024-08-28 MED ORDER — IOPAMIDOL (ISOVUE-370) INJECTION 76%
65.0000 mL | Freq: Once | INTRAVENOUS | Status: AC | PRN
  Administered 2024-08-28: 65 mL via INTRAVENOUS

## 2024-09-03 ENCOUNTER — Ambulatory Visit: Payer: Self-pay | Admitting: Nurse Practitioner

## 2024-09-03 NOTE — Progress Notes (Signed)
 Reassuring follow up CT. Resolved axillary LAD. The other masses are either stable or slightly decreased, which is indicative of a positive treatment response. She has a small lesion on her pancreas, which is felt to be insignificant but can continue to be monitor on repeat scans as ordered by oncology. Will let them decide on timing of repeat scans. Thanks.

## 2024-09-18 ENCOUNTER — Other Ambulatory Visit

## 2024-09-18 ENCOUNTER — Ambulatory Visit: Admitting: Hematology

## 2024-09-18 ENCOUNTER — Ambulatory Visit

## 2024-09-18 DIAGNOSIS — Z1231 Encounter for screening mammogram for malignant neoplasm of breast: Secondary | ICD-10-CM | POA: Diagnosis not present

## 2024-09-19 ENCOUNTER — Ambulatory Visit

## 2024-09-21 NOTE — Assessment & Plan Note (Signed)
 diagnosed in 10/2021 ---She developed worsening back pain, pelvic MRI 07/04/21 showed a mass in the presacral space. 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  -She began azacitadine on 02/06/22. she has had multiple hospital admissions and her performance status has been very low.  Changed her chemo to every 6 weeks in 08/2022 due to her fatigue. -she is tolerating every 6 weeks much better -she saw Dr. [name] in Feb 2024, who recommends to continue current therapy  -Due to worsening low back pain, she completed palliative radiation on 07/02/2023 -due to worsening low back pain, she was hospitalized on November 12, 2023.  Currently follow-up with palliative care for pain control. -restaging CT chest in 04/2024 showed interval development of single, mildly enlarged left axillary lymph node which was nonspecific.  There was also mild enlargement of subpleural and paraspinal masses in the right hemithorax.  Overall, stable disease.  Plan to continue treatment with azacitidine  every 6 weeks (days 1 through 7).

## 2024-09-21 NOTE — Progress Notes (Unsigned)
 Patient Care Team: [name], MD as PCP - General (Internal Medicine) [name], [name], MD (Inactive) as PCP - Cardiology (Cardiology) [name], MD as Consulting Physician (Rheumatology) [name], MD as Consulting Physician (Hematology) [name], MD as Consulting Physician (Gastroenterology) [name], DO as Consulting Physician (Pulmonary Disease) Pickenpack-Cousar, [name], NP as Nurse Practitioner [facility] and Palliative Medicine)  Clinic Day:  09/22/2024  Referring physician: [name], MD  ASSESSMENT & PLAN:   Assessment & Plan: CMML (chronic myelomonocytic leukemia) (HCC) diagnosed in 10/2021 ---She developed worsening back pain, pelvic MRI 07/04/21 showed a mass in the presacral space. 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 Her BM biopsy was reviewed at [facility] and was felt to be CMML-1 with 5% blasts.  -She began azacitadine on 02/06/22. she has had multiple hospital admissions and her performance status has been very low.  Changed her chemo to every 6 weeks in 08/2022 due to her fatigue. -she is tolerating every 6 weeks much better -she saw Dr. [name] in Feb 2024, who recommends to continue current therapy  -Due to worsening low back pain, she completed palliative radiation on 07/02/2023 -due to worsening low back pain, she was hospitalized on November 12, 2023.  Currently follow-up with palliative care for pain control. -restaging CT chest in 04/2024 showed interval development of single, mildly enlarged left axillary lymph node which was nonspecific.  There was also mild enlargement of subpleural and paraspinal masses in the right hemithorax.  Overall, stable disease.  Plan to continue treatment with azacitidine  every 6 weeks (days 1 through 5) of every 6 weeks.     Leukocytosis Due to MPN.  Denies fever, chills, unintentional weight loss, or night sweats.  Will proceed with treatment today chemotherapy azacitidine .  Anemia Mild and stable stable anemia.  No need for blood transfusion or iron infusion.  Monitor every 6 weeks prior to chemotherapy.  Mild dehydration Evidenced by elevation in serum creatinine and decreased eGFR.  We discussed increasing water intake to approximately 64 ounces water per day.  Plan Labs reviewed. -Mild leukocytosis and anemia. - Mild dehydration. Labs and patient presentation are appropriate for treatment today. Proceed with cycle 19 day 1 chemotherapy azacitidine .  Subsequent treatments for days 2 through 5 as scheduled. Labs/flush, follow-up, and cycle 20 in 6 weeks.   The patient understands the plans discussed today and is in agreement with them.  She knows to contact our office if she develops concerns prior to her next appointment.  I provided 25 minutes of face-to-face time during this encounter and > 50% was spent counseling as documented under my assessment and plan.    Powell FORBES Lessen, NP  [facility] - A DEPT OF [facility] [address] [place] [address] Dept: [phone] Dept Fax: [phone]   No orders of the defined types were placed in this encounter.     CHIEF COMPLAINT:  CC: Chronic myelomonocytic leukemia  Current Treatment: Azacitidine  D1-7 every 6 weeks  INTERVAL HISTORY:  [name] is here today for repeat clinical assessment.  She last saw me on 08/07/2024.  She underwent CT chest with contrast on 08/28/2024.  This showed response to therapy of left axillary lymphadenopathy.  There is stable appearance of paraspinous and lateral masses.  Stable.  Right internal mammary adenopathy versus  nodularity.  1 cm pancreatic cystic lesion noted, likely pseudocyst or indolent cystic neoplasm.  Attention on follow-up CT scans.  She denies chest pain, chest pressure, or shortness of breath. She denies headaches or visual disturbances. She denies abdominal pain, nausea, vomiting, or changes in bowel or bladder habits.   She denies fevers or chills, night sweats, or unintentional weight loss. She denies pain. Her appetite is okaygood extremities normal. Her weight has been stable.  I have reviewed the past medical history, past surgical history, social history and family history with the patient and they are unchanged from previous note.  ALLERGIES:  is allergic to digoxin  and related, gluten meal, penicillins, fexofenadine, alendronate, and hydroxychloroquine.  MEDICATIONS:  Current Outpatient Medications  Medication Sig Dispense Refill   acetaminophen  (TYLENOL ) 500 MG tablet Take 2 tablets (1,000 mg total) by mouth 3 (three) times daily. 30 tablet 0   apixaban  (ELIQUIS ) 5 MG TABS tablet TAKE 1 TABLET(5 MG) BY MOUTH TWICE DAILY 60 tablet 5   azaCITIDine  (VIDAZA ) 100 MG SUSR 114mg  Injection every 6 weeks     denosumab  (PROLIA ) 60 MG/ML SOSY injection Inject 60 mg into the skin every 6 (six) months.     diltiazem  (CARDIZEM  CD) 360 MG 24 hr capsule Take 1 capsule (360 mg total) by mouth daily. 90 capsule 0   furosemide  (LASIX ) 20 MG tablet Take 1 tablet (20 mg total) by mouth daily. 30 tablet 11   gabapentin  (NEURONTIN ) 300 MG capsule Take 300 mg by mouth in the morning.     inFLIXimab  (REMICADE ) 100 MG injection Inject into the vein See admin instructions.     loratadine  (CLARITIN ) 10 MG tablet Take 10 mg by mouth daily.     Metoprolol  Tartrate 75 MG TABS Take 2 tablets (150 mg total) by mouth 2 (two) times daily. 360 tablet 1   predniSONE  (DELTASONE ) 5 MG tablet Take 5 mg by mouth daily with breakfast.     Tiotropium Bromide-Olodaterol (STIOLTO RESPIMAT ) 2.5-2.5 MCG/ACT AERS Inhale 2 puffs into the lungs daily. 4 g 11   No current facility-administered medications for this visit.   Facility-Administered Medications  Ordered in Other Visits  Medication Dose Route Frequency Provider Last Rate Last Admin   acetaminophen  (TYLENOL ) 325 MG tablet            diphenhydrAMINE  (BENADRYL ) 25 mg capsule             HISTORY OF PRESENT ILLNESS:   Oncology History Overview Note   Cancer Staging  CMML (chronic myelomonocytic leukemia) (HCC) Staging form: Chronic Myeloid Leukemia, AJCC 8th Edition - Clinical stage from 01/12/2022: Bone marrow blast count (%): 12, Additional clonal changes: Unknown - Signed by [name], MD on 01/12/2022 Stage prefix: Initial diagnosis     CMML (chronic myelomonocytic leukemia) (HCC)  11/09/2021 Initial Biopsy   DIAGNOSIS:   -  Monoclonal B-cell population with co-expression of CD5 comprises 17%  of all lymphocytes  -  See comment   COMMENT:  In addition to the clonal B-cell population, there is a myeloblast  population (CD34, CD38, HLA-DR, CD117, CD123 and CD33) that comprises 2% of the total cellular events.  Please see concurrent tissue biopsy (below) for additional work-up and final diagnosis.    FINAL MICROSCOPIC DIAGNOSIS:   A. SOFT TISSUE MASS, PRE SACRAL, NEEDLE CORE BIOPSY:  -  Chronic lymphocytic leukemia/small lymphocytic lymphoma  -  Extra medullary hematopoiesis  -  See comment   COMMENT:  The biopsy consists of multiple soft tissue  cores with lymphoid nodules and a dense hematopoietic infiltrate consistent with extra medullary hematopoiesis.  MPO and E-cadherin highlight myeloid and erythroid precursors respectively.  CD34 highlights increased vasculature and is also positive within the cytoplasm of megakaryocytes.  A few small, immature mononuclear cells appear to be positive for CD34 and CD117. TdT shows rare, scattered positive cells.  CD20 highlights aggregates of B cells which are admixed with CD3 positive T cells.  T cells are an admixture of CD4 and CD8.  The B cells are also positive for CD5, CD23 and Bcl-2.  The B cells do not show significant staining  for CD10, BCL6 or cyclin D1.  CD138 highlights scattered plasma cells which are polytypic by kappa and lambda in situ hybridization.  Flow cytometry performed on the sample (see WL [id number]) identified a kappa restricted CD5 positive B-cell population comprising 70% of lymphocytes.  In addition, a small myeloblast population comprised 2% of the total cellular events.   Overall, the findings are consistent with soft tissue involvement by  chronic lymphocytic leukemia/small lymphocytic lymphoma and extra medullary hematopoiesis. In reviewing the patient's CBC data (macrocytic anemia and thrombocytopenia), I would recommend a bone marrow biopsy to assess for marrow involvement by CLL/SLL.    12/23/2021 Imaging   EXAM: CT CHEST, ABDOMEN, AND PELVIS WITH CONTRAST  IMPRESSION: 1. Slight interval enlargement of a presacral soft tissue mass measuring 7.2 x 4.7 cm, previously 6.9 x 4.1 cm on prior MR of the pelvis dated 07/04/2021. By report, this represents a biopsy proven lymphoma. 2. Pleural nodule of the dependent right lower lobe overlying the posterior right tenth rib and pleural or paraspinous soft tissue mass overlying the right aspect of the T10 vertebral body, very slightly enlarged compared to prior examination of the chest dated 06/25/2020, consistent with additional sites of lymphomatous involvement given very indolent growth. These could be better assessed for metabolic activity by FDG PET/CT if desired. 3. There is mild, bibasilar predominant pulmonary fibrosis in a pattern featuring irregular peripheral interstitial opacity, septal thickening, but without clear evidence of subpleural bronchiolectasis or honeycombing, with a somewhat asymmetric distribution most conspicuously involving the right lower lobe and lingula. These findings are significantly worsened when compared to prior examination dated 06/25/2020, particularly in the right lower lobe. Given interval change, this may  reflect sequelae of interval infection or aspiration, however appearance is generally suspicious for fibrotic interstitial lung disease, and if characterized by ATS pulmonary fibrosis is in an indeterminate for UIP pattern, differential considerations including both UIP and NSIP. 4. Emphysema.   Aortic Atherosclerosis (ICD10-I70.0) and Emphysema (ICD10-J43.9).   01/05/2022 Pathology Results   DIAGNOSIS:   BONE MARROW, ASPIRATE, CLOT, CORE:  -Hypercellular bone marrow for age with features of  myelodysplastic/myeloproliferative neoplasm  -Minor abnormal B-cell population  -See comment   PERIPHERAL BLOOD:  -Macrocytic anemia  -Neutrophilic left shift and monocytosis  -Thrombocytopenia   COMMENT:  The bone marrow is hypercellular for age with dyspoietic changes  involving myeloid cell lines associated with monocytosis and increased number of blastic cells (12%) as primarily seen by morphology, many of which display monocytic features.  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  In this background, there are several predominantly small lymphoid aggregates mostly composed of small lymphoid cells.  By flow cytometry, a minor abnormal B-cell population expressing CD5 is seen and representing 2% of all cells.  This correlate with previously known  B-cell lymphoproliferative process.  Correlation with cytogenetic and FISH studies is strongly recommended.    DIAGNOSIS:   -Increased number of monocytic cells present (25%)  -Minor abnormal B-cell population identified.  -See comment   COMMENT:  Flow cytometric analysis shows increased number of monocytic cells representing 25% of all cells but without aberrant phenotype or CD34 expression.  A significant CD34-positive blastic population is not identified.  The lymphoid  population shows a minor B-cell population representing 2% of all cells and expressing B-cell antigens including CD20 associated with CD5, CD200 and possibly dim kappa expression.  The latter findings are abnormal and correlate with previously known B-cell lymphoproliferative process.  No significant T-cell phenotypic abnormalities identified.    01/12/2022 Initial Diagnosis   CMML (chronic myelomonocytic leukemia) (HCC)   01/12/2022 Cancer Staging   Staging form: Chronic Myeloid Leukemia, AJCC 8th Edition - Clinical stage from 01/12/2022: Bone marrow blast count (%): 12, Additional clonal changes: Unknown - Signed by [name], MD on 01/12/2022 Stage prefix: Initial diagnosis   02/06/2022 - 08/11/2022 Chemotherapy   Patient is on Treatment Plan : MYELODYSPLASIA  Azacitidine  IV D1-7 q28d     02/06/2022 -  Chemotherapy   Patient is on Treatment Plan : MYELODYSPLASIA  Azacitidine  IV D1-7 q28d     06/03/2023 Imaging   MR lumbar spine with and without contrast IMPRESSION: 1. Long-standing presacral mass, at least doubled in size since 2016, now at least 8 cm in length. There is internal fat components and differential considerations include myelolipoma, liposarcoma, and teratoma. 2. 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 3. L2-3 left paracentral extrusion compressing the left L3 nerve root. 4. Right foraminal impingement at L3-4 to L4-5, greatest at L4-5. Right subarticular recess narrowing at L3-4 and L4-5.   07/09/2023 Imaging   CT abdomen and pelvis with contrast  IMPRESSION: No acute findings in the abdomen or pelvis. Specifically, no findings to explain the patient's history of pain. 2. Interval progression of presacral and right thoracic paraspinal soft tissue lesions compatible with mild progression of disease since 06/19/2022. Small posterior right thoracic chest wall lesion also mildly progressive. 3. Mild circumferential wall thickening distal  esophagus. Esophagitis would be a consideration. 4.  Aortic Atherosclerosis (ICD10-I70.0).   05/12/2024 Imaging   CT chest w/o contrast IMPRESSION: 1. Moderate emphysema. 2. Interval development of a single mildly enlarged left axillary lymph node, nonspecific. While this is not meet size criteria for pathologic enlargement, it is rounded shape and asymmetric cortical thickening suggests involvement with the patient's underlying chronic myelogenous leukemia. 3. Multiple subpleural/paraspinal masses within the right hemithorax demonstrating slight interval increase in size since prior examination. These may reflect areas of extra medullary hematopoiesis, which is favored given its relatively slow progression since prior examination, or leukemic deposits. 4. Mild cardiomegaly.  Trace interstitial pulmonary edema. 5. Cholelithiasis. 6. Small hiatal hernia.         REVIEW OF SYSTEMS:   Constitutional: Denies fevers, chills or abnormal weight loss Eyes: Denies blurriness of vision Ears, nose, mouth, throat, and face: Denies mucositis or sore throat Respiratory: Denies cough, dyspnea or wheezes Cardiovascular: Denies palpitation, chest discomfort or lower extremity swelling Gastrointestinal:  Denies nausea, heartburn or change in bowel habits Skin: Denies abnormal skin rashes Lymphatics: Denies new lymphadenopathy or easy bruising Neurological:Denies numbness, tingling or new weaknesses Behavioral/Psych: Mood is stable, no new changes  All other systems were reviewed with the patient and are negative.   VITALS:   Today's Vitals   09/22/24  1310  BP: 110/60  Pulse: 78  Resp: 17  Temp: 98.1 F (36.7 C)  SpO2: 98%  Weight: 104 lb 9.6 oz (47.4 kg)   Body mass index is 18.53 kg/m.   Wt Readings from Last 3 Encounters:  09/22/24 104 lb 9.6 oz (47.4 kg)  09/22/24 105 lb (47.6 kg)  08/11/24 104 lb 8 oz (47.4 kg)    Body mass index is 18.53 kg/m.  Performance status (ECOG): 1 -  Symptomatic but completely ambulatory  PHYSICAL EXAM:   GENERAL:alert, no distress and comfortable SKIN: skin color, texture, turgor are normal, no rashes or significant lesions EYES: normal, Conjunctiva are pink and non-injected, sclera clear OROPHARYNX:no exudate, no erythema and lips, buccal mucosa, and tongue normal  NECK: supple, thyroid  normal size, non-tender, without nodularity LYMPH:  no palpable lymphadenopathy in the cervical, axillary or inguinal LUNGS: clear to auscultation and percussion with normal breathing effort HEART: regular rate & rhythm and no murmurs and no lower extremity edema ABDOMEN:abdomen soft, non-tender and normal bowel sounds Musculoskeletal:no cyanosis of digits and no clubbing  NEURO: alert & oriented x 3 with fluent speech, no focal motor/sensory deficits  LABORATORY DATA:  I have reviewed the data as listed    Component Value Date/Time   NA 138 09/22/2024 1245   NA 141 01/04/2016 1001   K 4.3 09/22/2024 1245   K 4.5 01/04/2016 1001   CL 103 09/22/2024 1245   CO2 27 09/22/2024 1245   CO2 24 01/04/2016 1001   GLUCOSE 112 (H) 09/22/2024 1245   GLUCOSE 105 01/04/2016 1001   BUN 23 09/22/2024 1245   BUN 21.7 01/04/2016 1001   CREATININE 1.28 (H) 09/22/2024 1245   CREATININE 1.2 (H) 01/04/2016 1001   CALCIUM  9.1 09/22/2024 1245   CALCIUM  9.7 01/04/2016 1001   PROT 6.4 (L) 04/04/2024 0903   PROT 7.0 01/04/2016 1001   ALBUMIN 3.6 04/04/2024 0903   ALBUMIN 4.0 01/04/2016 1001   AST 20 04/04/2024 0903   AST 21 12/21/2023 1359   AST 26 01/04/2016 1001   ALT 14 04/04/2024 0903   ALT 14 12/21/2023 1359   ALT 24 01/04/2016 1001   ALKPHOS 40 04/04/2024 0903   ALKPHOS 61 01/04/2016 1001   BILITOT 1.1 04/04/2024 0903   BILITOT 0.8 12/21/2023 1359   BILITOT 0.41 01/04/2016 1001   GFRNONAA 43 (L) 09/22/2024 1245   GFRAA 56 (L) 01/09/2020 1339     Lab Results  Component Value Date   WBC 37.9 (H) 09/22/2024   NEUTROABS 17.8 (H) 09/22/2024    HGB 11.2 (L) 09/22/2024   HCT 34.3 (L) 09/22/2024   MCV 100.0 09/22/2024   PLT 269 09/22/2024    RADIOGRAPHIC STUDIES: CT Chest W Contrast Result Date: 08/30/2024 CLINICAL DATA:  Chronic myelogenous leukemia. Lymphadenopathy. * Tracking Code: BO * EXAM: CT CHEST WITH CONTRAST TECHNIQUE: Multidetector CT imaging of the chest was performed during intravenous contrast administration. RADIATION DOSE REDUCTION: This exam was performed according to the departmental dose-optimization program which includes automated exposure control, adjustment of the mA and/or kV according to patient size and/or use of iterative reconstruction technique. CONTRAST:  65mL ISOVUE -370 IOPAMIDOL  (ISOVUE -370) INJECTION 76% COMPARISON:  05/12/2024 FINDINGS: Cardiovascular: Right Port-A-Cath tip at high right atrium. Aortic atherosclerosis. Tortuous thoracic aorta. Moderate cardiomegaly, without pericardial effusion. No central pulmonary embolism, on this non-dedicated study. Mediastinum/Nodes: No supraclavicular adenopathy. Resolved left axillary adenopathy. No mediastinal or hilar adenopathy. Right internal mammary node versus anterior pleural implant measures 10 x 9 mm on 73/2  and is similar to the prior exam (when remeasured). Right greater than left paravertebral soft tissue thickening/masses. Right-sided index mass measures 5.3 x 2.7 cm on 97/2 versus 5.4 x 2.3 cm on the prior exam (when remeasured). Lungs/Pleura: No pleural effusion. Right greater than left areas of pleural thickening/nodularity again identified. Example within the posterior right pleural space at 2.6 x 1.2 cm on 99/2. Compare 3.1 x 1.1 cm on the prior. Centrilobular and paraseptal emphysema. Biapical pleuroparenchymal scarring. Upper Abdomen: Hepatic cysts of up to 1.5 cm. Dependent gallstone. Normal imaged portions of the spleen, stomach, adrenal glands. Low-density left renal lesions of up to 1.5 cm are likely cysts. Pancreatic head/neck 1.0 cm hypoattenuating  lesion on 148/2 is relatively similar to 07/09/2023. Musculoskeletal: Lower thoracic spondylosis. IMPRESSION: 1. Response to therapy of left axillary adenopathy. 2. Relatively similar appearance of right greater than left paraspinous and pleural based masses. Differential considerations remain extramedullary hematopoiesis or lymphoma. Given response to therapy of the left axillary adenopathy, extramedullary hematopoiesis is favored. 3. Right internal mammary adenopathy versus anterior pleural nodularity, similar and of likely the same etiology as impression 2. 4. 1 cm pancreatic cystic lesion is of doubtful clinical significance. Most likely a pseudocyst or indolent cystic neoplasm. This can be re-evaluated on follow-up staging CTs. 5.  Aortic Atherosclerosis (ICD10-I70.0). 6. Cholelithiasis Electronically Signed   By: Rockey Kilts M.D.   On: 08/30/2024 15:46

## 2024-09-22 ENCOUNTER — Encounter: Payer: Self-pay | Admitting: Nurse Practitioner

## 2024-09-22 ENCOUNTER — Inpatient Hospital Stay

## 2024-09-22 ENCOUNTER — Encounter: Payer: Self-pay | Admitting: Hematology

## 2024-09-22 ENCOUNTER — Inpatient Hospital Stay: Attending: Nurse Practitioner | Admitting: Nurse Practitioner

## 2024-09-22 ENCOUNTER — Ambulatory Visit

## 2024-09-22 ENCOUNTER — Encounter: Payer: Self-pay | Admitting: Cardiology

## 2024-09-22 ENCOUNTER — Ambulatory Visit: Attending: Cardiology | Admitting: Cardiology

## 2024-09-22 VITALS — BP 110/60 | HR 78 | Temp 98.1°F | Resp 17 | Wt 104.6 lb

## 2024-09-22 VITALS — BP 117/71 | HR 69 | Resp 16 | Ht 63.0 in | Wt 105.0 lb

## 2024-09-22 DIAGNOSIS — E782 Mixed hyperlipidemia: Secondary | ICD-10-CM | POA: Diagnosis not present

## 2024-09-22 DIAGNOSIS — C9310 Chronic myelomonocytic leukemia not having achieved remission: Secondary | ICD-10-CM | POA: Insufficient documentation

## 2024-09-22 DIAGNOSIS — I4892 Unspecified atrial flutter: Secondary | ICD-10-CM | POA: Diagnosis not present

## 2024-09-22 DIAGNOSIS — Z79899 Other long term (current) drug therapy: Secondary | ICD-10-CM | POA: Insufficient documentation

## 2024-09-22 DIAGNOSIS — Z5111 Encounter for antineoplastic chemotherapy: Secondary | ICD-10-CM | POA: Insufficient documentation

## 2024-09-22 DIAGNOSIS — Z7901 Long term (current) use of anticoagulants: Secondary | ICD-10-CM | POA: Diagnosis not present

## 2024-09-22 DIAGNOSIS — I4891 Unspecified atrial fibrillation: Secondary | ICD-10-CM | POA: Insufficient documentation

## 2024-09-22 LAB — CBC WITH DIFFERENTIAL (CANCER CENTER ONLY)
Abs Immature Granulocytes: 4.99 K/uL — ABNORMAL HIGH (ref 0.00–0.07)
Basophils Absolute: 1.1 K/uL — ABNORMAL HIGH (ref 0.0–0.1)
Basophils Relative: 3 %
Eosinophils Absolute: 1.3 K/uL — ABNORMAL HIGH (ref 0.0–0.5)
Eosinophils Relative: 3 %
HCT: 34.3 % — ABNORMAL LOW (ref 36.0–46.0)
Hemoglobin: 11.2 g/dL — ABNORMAL LOW (ref 12.0–15.0)
Immature Granulocytes: 13 %
Lymphocytes Relative: 6 %
Lymphs Abs: 2.4 K/uL (ref 0.7–4.0)
MCH: 32.7 pg (ref 26.0–34.0)
MCHC: 32.7 g/dL (ref 30.0–36.0)
MCV: 100 fL (ref 80.0–100.0)
Monocytes Absolute: 10.3 K/uL — ABNORMAL HIGH (ref 0.1–1.0)
Monocytes Relative: 27 %
Neutro Abs: 17.8 K/uL — ABNORMAL HIGH (ref 1.7–7.7)
Neutrophils Relative %: 48 %
Platelet Count: 269 K/uL (ref 150–400)
RBC: 3.43 MIL/uL — ABNORMAL LOW (ref 3.87–5.11)
RDW: 16.8 % — ABNORMAL HIGH (ref 11.5–15.5)
WBC Count: 37.9 K/uL — ABNORMAL HIGH (ref 4.0–10.5)
nRBC: 0.1 % (ref 0.0–0.2)

## 2024-09-22 LAB — BASIC METABOLIC PANEL - CANCER CENTER ONLY
Anion gap: 8 (ref 5–15)
BUN: 23 mg/dL (ref 8–23)
CO2: 27 mmol/L (ref 22–32)
Calcium: 9.1 mg/dL (ref 8.9–10.3)
Chloride: 103 mmol/L (ref 98–111)
Creatinine: 1.28 mg/dL — ABNORMAL HIGH (ref 0.44–1.00)
GFR, Estimated: 43 mL/min — ABNORMAL LOW (ref >60)
Glucose, Bld: 112 mg/dL — ABNORMAL HIGH (ref 70–99)
Potassium: 4.3 mmol/L (ref 3.5–5.1)
Sodium: 138 mmol/L (ref 135–145)

## 2024-09-22 MED ORDER — SODIUM CHLORIDE 0.9 % IV SOLN: Freq: Once | INTRAVENOUS | Status: AC

## 2024-09-22 MED ORDER — ONDANSETRON HCL 4 MG/2ML IJ SOLN
8.0000 mg | Freq: Once | INTRAMUSCULAR | Status: AC
  Administered 2024-09-22: 8 mg via INTRAVENOUS
  Filled 2024-09-22: qty 4

## 2024-09-22 MED ORDER — SODIUM CHLORIDE 0.9 % IV SOLN
75.0000 mg/m2 | Freq: Once | INTRAVENOUS | Status: AC
  Administered 2024-09-22: 114 mg via INTRAVENOUS
  Filled 2024-09-22: qty 11.4

## 2024-09-22 NOTE — Patient Instructions (Signed)
 Medication Instructions:  Your physician recommends that you continue on your current medications as directed. Please refer to the Current Medication list given to you today.  *If you need a refill on your cardiac medications before your next appointment, please call your pharmacy*  Lab Work: NONE  If you have labs (blood work) drawn today and your tests are completely normal, you will receive your results only by: MyChart Message (if you have MyChart) OR A paper copy in the mail If you have any lab test that is abnormal or we need to change your treatment, we will call you to review the results.  Testing/Procedures: NONE  Follow-Up: At [facility], you and your health needs are our priority.  As part of our continuing mission to provide you with exceptional heart care, our providers are all part of one team.  This team includes your primary Cardiologist (physician) and Advanced Practice Providers or APPs (Physician Assistants and Nurse Practitioners) who all work together to provide you with the care you need, when you need it.  Your next appointment:   6 month(s)  Provider:   One of our Advanced Practice Providers (APPs): Morse Clause, PA-C  Lamarr Satterfield, NP Miriam Shams, NP  [name] Pavy, PA-C Josefa Beauvais, NP  Leontine Salen, PA-C Orren Fabry, PA-C  [place], PA-C Ernest Dick, NP  Damien Braver, NP Jon Hails, PA-C  Waddell Donath, PA-C    Dayna Dunn, PA-C  Scott Weaver, PA-C Lum Louis, NP Katlyn West, NP [name] Goodrich, PA-C  Xika Zhao, NP Sheng Haley, PA-C    Kathleen Johnson, PA-C   Then, Sunit Tolia, DO will plan to see you again in 1 year(s).

## 2024-09-22 NOTE — Progress Notes (Signed)
 Cardiology Office Note:  .   Date:  09/22/2024  ID:  [name], DOB 04-May-1946, MRN [id number] PCP:  [name], MD  Former Cardiology Providers: Dr. Aleene Passe [facility] Providers Cardiologist:  Aleene Passe, MD (Inactive) , [facility] (established care 09/22/24) Electrophysiologist:  None  Click to update primary MD,subspecialty MD or APP then REFRESH:1}    Chief Complaint  Patient presents with   Follow-up    1 year    History of Present Illness: .   [name] is a 78 y.o. Caucasian female whose past medical history and cardiovascular risk factors includes: CKD, hyperlipidemia, COPD, atrial flutter/fibrillation, CMML, history of anemia requiring PRBCs, aortic atherosclerosis.  Formally under the care of Dr. Aleene Passe  who last saw [name] back in 09/2023. I am seeing her for the first time to re-establishing care.   Patient is accompanied by her husband at today's office visit.  Atrial fibrillation: Diagnosed approximately 2 years ago in 2023, per husband Patient is not aware of when she goes in and out of atrial fibrillation. No prior history of cardioversion. In the past when she was thrombocytopenic she was not on anticoagulation. 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 Her most recent hemoglobin in August 2025 remained stable, between 10-11 g/dL Patient does not endorse evidence of bleeding  Review of Systems: .   Review of Systems  Cardiovascular:  Negative for chest pain, claudication, irregular heartbeat, leg swelling, near-syncope, orthopnea, palpitations, paroxysmal nocturnal dyspnea and syncope.  Respiratory:  Negative for shortness of breath.   Hematologic/Lymphatic: Negative for bleeding problem.    Studies Reviewed:   EKG: EKG Interpretation Date/Time:  Monday September 22 2024 11:08:26 EDT Ventricular Rate:  105 PR Interval:    QRS Duration:  80 QT  Interval:  298 QTC Calculation: 393 R Axis:   146  Text Interpretation: Atrial flutter with variable A-V block with premature ventricular or aberrantly conducted complexes Possible Right ventricular hypertrophy Possible Anterior infarct , age undetermined When compared with ECG of 04-Apr-2024 08:38, Since last tracing rate slower Confirmed by Michele Richardson [phone]) on 09/22/2024 11:39:36 AM  Echocardiogram: 03/2022 LVEF 45-50%, global hypokinesis. Diastolic dysfunction could not be evaluated. RV function mildly reduced, RV size normal. RVSP 38 mmHg. Mild to moderate MR Estimated RAP 15 mmHg  RADIOLOGY: N/A  Risk Assessment/Calculations:   Click Here to Calculate/Change CHADS2VASc Score The patient's CHADS2-VASc score is 6, indicating a 9.7% annual risk of stroke.   CHF History: Yes HTN History: Yes Diabetes History: No Stroke History: No Vascular Disease History: Yes   Labs:       Latest Ref Rng & Units 08/07/2024    1:42 PM 06/23/2024   12:42 PM 05/12/2024   12:51 PM  CBC  WBC 4.0 - 10.5 K/uL 24.8  19.4  22.3   Hemoglobin 12.0 - 15.0 g/dL 89.1  88.9  89.2   Hematocrit 36.0 - 46.0 % 34.2  34.6  33.0   Platelets 150 - 400 K/uL 195  249  216        Latest Ref Rng & Units 08/07/2024    1:42 PM 06/23/2024   12:42 PM 05/12/2024   12:51 PM  BMP  Glucose 70 - 99 mg/dL 844  879  875   BUN 8 - 23 mg/dL 20  20  28    Creatinine 0.44 - 1.00 mg/dL 8.90  8.90  8.86   Sodium 135 -  145 mmol/L 139  137  137   Potassium 3.5 - 5.1 mmol/L 3.9  4.5  4.5   Chloride 98 - 111 mmol/L 104  103  100   CO2 22 - 32 mmol/L 28  25  28    Calcium  8.9 - 10.3 mg/dL 8.6  9.0  9.6       Latest Ref Rng & Units 08/07/2024    1:42 PM 06/23/2024   12:42 PM 05/12/2024   12:51 PM  CMP  Glucose 70 - 99 mg/dL 844  879  875   BUN 8 - 23 mg/dL 20  20  28    Creatinine 0.44 - 1.00 mg/dL 8.90  8.90  8.86   Sodium 135 - 145 mmol/L 139  137  137   Potassium 3.5 - 5.1 mmol/L 3.9  4.5  4.5   Chloride 98 - 111  mmol/L 104  103  100   CO2 22 - 32 mmol/L 28  25  28    Calcium  8.9 - 10.3 mg/dL 8.6  9.0  9.6     No results found for: CHOL, HDL, LDLCALC, LDLDIRECT, TRIG, CHOLHDL No results for input(s): LIPOA in the last 8760 hours. No components found for: NTPROBNP No results for input(s): PROBNP in the last 8760 hours. No results for input(s): TSH in the last 8760 hours.  Physical Exam:    Today's Vitals   09/22/24 1109  BP: 117/71  Pulse: 69  Resp: 16  SpO2: 96%  Weight: 105 lb (47.6 kg)  Height: 5' 3 (1.6 m)   Body mass index is 18.6 kg/m. Wt Readings from Last 3 Encounters:  09/22/24 105 lb (47.6 kg)  08/11/24 104 lb 8 oz (47.4 kg)  08/07/24 106 lb 11.2 oz (48.4 kg)    Physical Exam  Constitutional: No distress. She appears chronically ill.  hemodynamically stable, frail, ambulates with a walker  Neck: No JVD present.  Cardiovascular: Normal rate, S1 normal and S2 normal. An irregularly irregular rhythm present. Exam reveals no gallop, no S3 and no S4.  No murmur heard. Pulmonary/Chest: Effort normal. No stridor. She has no wheezes. She has no rales.  Decreased breath sound   Musculoskeletal:        General: No edema.     Cervical back: Neck supple.  Skin: Skin is warm and dry.  Ecchymosis     Impression & Recommendation(s):  Impression:   ICD-10-CM   1. Atrial fibrillation/flutter (HCC)  I48.91 EKG 12-Lead   I48.92     2. Long term (current) use of anticoagulants  Z79.01     3. Mixed hyperlipidemia  E78.2        Recommendation(s):  Atrial fibrillation/flutter (HCC) Diagnosed, approximately 2023 per patient and family Asymptomatic Remains in rate controlled atrial fibrillation/flutter. No prior history of direct-current cardioversion. Continue diltiazem  360 mg p.o. daily. Continue metoprolol  75 mg 2 tabs twice daily Continue Eliquis  5 mg p.o. twice daily We discussed rhythm management during today's office visit which included  antiarrhythmic as well as direct-current cardioversion.  Shared decision between the patient and husband with regards to their goals of care was to continue conservative management. Recent labs from 08/07/2024 reviewed, hemoglobin stable  Long term (current) use of anticoagulants Indication: Persistent atrial fibrillation/flutter But does not endorse evidence of bleeding Does carry history of thrombocytopenia and anemia requiring PRBCs Risks, benefits, alternatives to anticoagulation discussed She routinely has labs with hematology given her history of CMML.  Mixed hyperlipidemia Carries a history of hyperlipidemia, not on pharmacological  therapy Cardiology following peripherally, managed by primary care provider.   Orders Placed:  Orders Placed This Encounter  Procedures   EKG 12-Lead     Final Medication List:   No orders of the defined types were placed in this encounter.   Medications Discontinued During This Encounter  Medication Reason   albuterol  (VENTOLIN  HFA) 108 (90 Base) MCG/ACT inhaler Change in therapy     Current Outpatient Medications:    acetaminophen  (TYLENOL ) 500 MG tablet, Take 2 tablets (1,000 mg total) by mouth 3 (three) times daily., Disp: 30 tablet, Rfl: 0   apixaban  (ELIQUIS ) 5 MG TABS tablet, TAKE 1 TABLET(5 MG) BY MOUTH TWICE DAILY, Disp: 60 tablet, Rfl: 5   azaCITIDine  (VIDAZA ) 100 MG SUSR, 114mg  Injection every 6 weeks, Disp: , Rfl:    denosumab  (PROLIA ) 60 MG/ML SOSY injection, Inject 60 mg into the skin every 6 (six) months., Disp: , Rfl:    diltiazem  (CARDIZEM  CD) 360 MG 24 hr capsule, Take 1 capsule (360 mg total) by mouth daily., Disp: 90 capsule, Rfl: 0   furosemide  (LASIX ) 20 MG tablet, Take 1 tablet (20 mg total) by mouth daily., Disp: 30 tablet, Rfl: 11   gabapentin  (NEURONTIN ) 300 MG capsule, Take 300 mg by mouth in the morning., Disp: , Rfl:    inFLIXimab  (REMICADE ) 100 MG injection, Inject into the vein See admin instructions., Disp: , Rfl:     loratadine  (CLARITIN ) 10 MG tablet, Take 10 mg by mouth daily., Disp: , Rfl:    Metoprolol  Tartrate 75 MG TABS, Take 2 tablets (150 mg total) by mouth 2 (two) times daily., Disp: 360 tablet, Rfl: 1   predniSONE  (DELTASONE ) 5 MG tablet, Take 5 mg by mouth daily with breakfast., Disp: , Rfl:    Tiotropium Bromide-Olodaterol (STIOLTO RESPIMAT ) 2.5-2.5 MCG/ACT AERS, Inhale 2 puffs into the lungs daily., Disp: 4 g, Rfl: 11 No current facility-administered medications for this visit.  Facility-Administered Medications Ordered in Other Visits:    acetaminophen  (TYLENOL ) 325 MG tablet, , , ,    diphenhydrAMINE  (BENADRYL ) 25 mg capsule, , , ,   Consent:   NA  Disposition:   36-month follow-up with APP. 1 year follow-up with myself  Her questions and concerns were addressed to her satisfaction. She voices understanding of the recommendations provided during this encounter.    Signed, Madonna Michele HAS, [facility]  A Division of [facility] [address], [place], [place] [address]  09/22/2024 1:06 PM

## 2024-09-22 NOTE — Patient Instructions (Signed)
 [facility] - A DEPT OF [facility]  Discharge Instructions: Thank you for choosing [facility] to provide your oncology and hematology care.   If you have a lab appointment with the Cancer Center, please go directly to the Cancer Center and check in at the registration area.   Wear comfortable clothing and clothing appropriate for easy access to any Portacath or PICC line.   We strive to give you quality time with your provider. You may need to reschedule your appointment if you arrive late (15 or more minutes).  Arriving late affects you and other patients whose appointments are after yours.  Also, if you miss three or more appointments without notifying the office, you may be dismissed from the clinic at the provider's discretion.      For prescription refill requests, have your pharmacy contact our office and allow 72 hours for refills to be completed.    Today you received the following chemotherapy and/or immunotherapy agents: Vidaza      To help prevent nausea and vomiting after your treatment, we encourage you to take your nausea medication as directed.  BELOW ARE SYMPTOMS THAT SHOULD BE REPORTED IMMEDIATELY: *FEVER GREATER THAN 100.4 F (38 C) OR HIGHER *CHILLS OR SWEATING *NAUSEA AND VOMITING THAT IS NOT CONTROLLED WITH YOUR NAUSEA MEDICATION *UNUSUAL SHORTNESS OF BREATH *UNUSUAL BRUISING OR BLEEDING *URINARY PROBLEMS (pain or burning when urinating, or frequent urination) *BOWEL PROBLEMS (unusual diarrhea, constipation, pain near the anus) TENDERNESS IN MOUTH AND THROAT WITH OR WITHOUT PRESENCE OF ULCERS (sore throat, sores in mouth, or a toothache) UNUSUAL RASH, SWELLING OR PAIN  UNUSUAL VAGINAL DISCHARGE OR ITCHING   Items with * indicate a potential emergency and should be followed up as soon as possible or go to the Emergency Department if any problems should occur.  Please show the CHEMOTHERAPY ALERT CARD or IMMUNOTHERAPY  ALERT CARD at check-in to the Emergency Department and triage nurse.  Should you have questions after your visit or need to cancel or reschedule your appointment, please contact [facility] - A DEPT OF [facility]  Dept: [phone]  and follow the prompts.  Office hours are 8:00 a.m. to 4:30 p.m. Monday - Friday. Please note that voicemails left after 4:00 p.m. may not be returned until the following business day.  We are closed weekends and major holidays. You have access to a nurse at all times for urgent questions. Please call the main number to the clinic Dept: [phone] and follow the prompts.   For any non-urgent questions, you may also contact your provider using MyChart. We now offer e-Visits for anyone 5 and older to request care online for non-urgent symptoms. For details visit [email or web address].   Also download the MyChart app! Go to the app store, search "MyChart", open the app, select [facility], and log in with your MyChart username and password.

## 2024-09-23 ENCOUNTER — Inpatient Hospital Stay

## 2024-09-23 ENCOUNTER — Telehealth: Payer: Self-pay | Admitting: Nurse Practitioner

## 2024-09-23 ENCOUNTER — Other Ambulatory Visit: Payer: Self-pay

## 2024-09-23 ENCOUNTER — Ambulatory Visit

## 2024-09-23 VITALS — BP 110/70 | HR 95 | Temp 98.0°F | Resp 15

## 2024-09-23 DIAGNOSIS — Z5111 Encounter for antineoplastic chemotherapy: Secondary | ICD-10-CM | POA: Diagnosis not present

## 2024-09-23 DIAGNOSIS — C9310 Chronic myelomonocytic leukemia not having achieved remission: Secondary | ICD-10-CM | POA: Diagnosis not present

## 2024-09-23 DIAGNOSIS — Z79899 Other long term (current) drug therapy: Secondary | ICD-10-CM | POA: Diagnosis not present

## 2024-09-23 MED ORDER — SODIUM CHLORIDE 0.9 % IV SOLN: Freq: Once | INTRAVENOUS | Status: AC

## 2024-09-23 MED ORDER — ONDANSETRON HCL 4 MG PO TABS: 4.0000 mg | ORAL_TABLET | Freq: Three times a day (TID) | ORAL | 1 refills | Status: DC | PRN

## 2024-09-23 MED ORDER — ONDANSETRON HCL 4 MG/2ML IJ SOLN
8.0000 mg | Freq: Once | INTRAMUSCULAR | Status: AC
  Administered 2024-09-23: 8 mg via INTRAVENOUS
  Filled 2024-09-23: qty 4

## 2024-09-23 MED ORDER — SODIUM CHLORIDE 0.9 % IV SOLN
75.0000 mg/m2 | Freq: Once | INTRAVENOUS | Status: AC
  Administered 2024-09-23: 114 mg via INTRAVENOUS
  Filled 2024-09-23: qty 11.4

## 2024-09-23 NOTE — Patient Instructions (Signed)
 [facility] - A DEPT OF [facility]  Discharge Instructions: Thank you for choosing [facility] to provide your oncology and hematology care.   If you have a lab appointment with the Cancer Center, please go directly to the Cancer Center and check in at the registration area.   Wear comfortable clothing and clothing appropriate for easy access to any Portacath or PICC line.   We strive to give you quality time with your provider. You may need to reschedule your appointment if you arrive late (15 or more minutes).  Arriving late affects you and other patients whose appointments are after yours.  Also, if you miss three or more appointments without notifying the office, you may be dismissed from the clinic at the provider's discretion.      For prescription refill requests, have your pharmacy contact our office and allow 72 hours for refills to be completed.    Today you received the following chemotherapy and/or immunotherapy agents: Vidaza      To help prevent nausea and vomiting after your treatment, we encourage you to take your nausea medication as directed.  BELOW ARE SYMPTOMS THAT SHOULD BE REPORTED IMMEDIATELY: *FEVER GREATER THAN 100.4 F (38 C) OR HIGHER *CHILLS OR SWEATING *NAUSEA AND VOMITING THAT IS NOT CONTROLLED WITH YOUR NAUSEA MEDICATION *UNUSUAL SHORTNESS OF BREATH *UNUSUAL BRUISING OR BLEEDING *URINARY PROBLEMS (pain or burning when urinating, or frequent urination) *BOWEL PROBLEMS (unusual diarrhea, constipation, pain near the anus) TENDERNESS IN MOUTH AND THROAT WITH OR WITHOUT PRESENCE OF ULCERS (sore throat, sores in mouth, or a toothache) UNUSUAL RASH, SWELLING OR PAIN  UNUSUAL VAGINAL DISCHARGE OR ITCHING   Items with * indicate a potential emergency and should be followed up as soon as possible or go to the Emergency Department if any problems should occur.  Please show the CHEMOTHERAPY ALERT CARD or IMMUNOTHERAPY  ALERT CARD at check-in to the Emergency Department and triage nurse.  Should you have questions after your visit or need to cancel or reschedule your appointment, please contact [facility] - A DEPT OF [facility]  Dept: [phone]  and follow the prompts.  Office hours are 8:00 a.m. to 4:30 p.m. Monday - Friday. Please note that voicemails left after 4:00 p.m. may not be returned until the following business day.  We are closed weekends and major holidays. You have access to a nurse at all times for urgent questions. Please call the main number to the clinic Dept: [phone] and follow the prompts.   For any non-urgent questions, you may also contact your provider using MyChart. We now offer e-Visits for anyone 5 and older to request care online for non-urgent symptoms. For details visit [email or web address].   Also download the MyChart app! Go to the app store, search "MyChart", open the app, select [facility], and log in with your MyChart username and password.

## 2024-09-23 NOTE — Telephone Encounter (Signed)
 [name] has been scheduled for her November appointments and she is aware of all appointment details.

## 2024-09-23 NOTE — Progress Notes (Signed)
 Vidaza  prepared today expires at 0714 on 09/24/24.  Crystin Lechtenberg, PharmD, MBA

## 2024-09-23 NOTE — Progress Notes (Signed)
 Verbal order w/readback from Dr. Lanny for Ondansetron  4mg  PO Q8hrs PRN 20Qty w/1refill.  Order entered and sent to pt's preferred pharmacy on file.

## 2024-09-24 ENCOUNTER — Ambulatory Visit

## 2024-09-24 ENCOUNTER — Inpatient Hospital Stay: Attending: Nurse Practitioner

## 2024-09-24 DIAGNOSIS — E790 Hyperuricemia without signs of inflammatory arthritis and tophaceous disease: Secondary | ICD-10-CM | POA: Diagnosis not present

## 2024-09-24 DIAGNOSIS — Z5111 Encounter for antineoplastic chemotherapy: Secondary | ICD-10-CM | POA: Insufficient documentation

## 2024-09-24 DIAGNOSIS — C9310 Chronic myelomonocytic leukemia not having achieved remission: Secondary | ICD-10-CM | POA: Diagnosis not present

## 2024-09-24 DIAGNOSIS — Z79899 Other long term (current) drug therapy: Secondary | ICD-10-CM | POA: Diagnosis not present

## 2024-09-24 DIAGNOSIS — C9210 Chronic myeloid leukemia, BCR/ABL-positive, not having achieved remission: Secondary | ICD-10-CM | POA: Diagnosis not present

## 2024-09-24 DIAGNOSIS — M1991 Primary osteoarthritis, unspecified site: Secondary | ICD-10-CM | POA: Diagnosis not present

## 2024-09-24 DIAGNOSIS — M0579 Rheumatoid arthritis with rheumatoid factor of multiple sites without organ or systems involvement: Secondary | ICD-10-CM | POA: Diagnosis not present

## 2024-09-24 DIAGNOSIS — Z681 Body mass index (BMI) 19 or less, adult: Secondary | ICD-10-CM | POA: Diagnosis not present

## 2024-09-24 MED ORDER — SODIUM CHLORIDE 0.9 % IV SOLN
75.0000 mg/m2 | Freq: Once | INTRAVENOUS | Status: AC
  Administered 2024-09-24: 114 mg via INTRAVENOUS
  Filled 2024-09-24: qty 11.4

## 2024-09-24 MED ORDER — ONDANSETRON HCL 4 MG/2ML IJ SOLN
8.0000 mg | Freq: Once | INTRAMUSCULAR | Status: AC
  Administered 2024-09-24: 8 mg via INTRAVENOUS
  Filled 2024-09-24: qty 4

## 2024-09-24 MED ORDER — SODIUM CHLORIDE 0.9 % IV SOLN: Freq: Once | INTRAVENOUS | Status: AC

## 2024-09-24 NOTE — Patient Instructions (Signed)
 [facility] - A DEPT OF [facility]  Discharge Instructions: Thank you for choosing [facility] to provide your oncology and hematology care.   If you have a lab appointment with the Cancer Center, please go directly to the Cancer Center and check in at the registration area.   Wear comfortable clothing and clothing appropriate for easy access to any Portacath or PICC line.   We strive to give you quality time with your provider. You may need to reschedule your appointment if you arrive late (15 or more minutes).  Arriving late affects you and other patients whose appointments are after yours.  Also, if you miss three or more appointments without notifying the office, you may be dismissed from the clinic at the provider's discretion.      For prescription refill requests, have your pharmacy contact our office and allow 72 hours for refills to be completed.    Today you received the following chemotherapy and/or immunotherapy agents vidaza       To help prevent nausea and vomiting after your treatment, we encourage you to take your nausea medication as directed.  BELOW ARE SYMPTOMS THAT SHOULD BE REPORTED IMMEDIATELY: *FEVER GREATER THAN 100.4 F (38 C) OR HIGHER *CHILLS OR SWEATING *NAUSEA AND VOMITING THAT IS NOT CONTROLLED WITH YOUR NAUSEA MEDICATION *UNUSUAL SHORTNESS OF BREATH *UNUSUAL BRUISING OR BLEEDING *URINARY PROBLEMS (pain or burning when urinating, or frequent urination) *BOWEL PROBLEMS (unusual diarrhea, constipation, pain near the anus) TENDERNESS IN MOUTH AND THROAT WITH OR WITHOUT PRESENCE OF ULCERS (sore throat, sores in mouth, or a toothache) UNUSUAL RASH, SWELLING OR PAIN  UNUSUAL VAGINAL DISCHARGE OR ITCHING   Items with * indicate a potential emergency and should be followed up as soon as possible or go to the Emergency Department if any problems should occur.  Please show the CHEMOTHERAPY ALERT CARD or IMMUNOTHERAPY  ALERT CARD at check-in to the Emergency Department and triage nurse.  Should you have questions after your visit or need to cancel or reschedule your appointment, please contact [facility] - [facility]  Dept: [phone]  and follow the prompts.  Office hours are 8:00 a.m. to 4:30 p.m. Monday - Friday. Please note that voicemails left after 4:00 p.m. may not be returned until the following business day.  We are closed weekends and major holidays. You have access to a nurse at all times for urgent questions. Please call the main number to the clinic Dept: [phone] and follow the prompts.   For any non-urgent questions, you may also contact your provider using MyChart. We now offer e-Visits for anyone 14 and older to request care online for non-urgent symptoms. For details visit [email or web address].   Also download the MyChart app! Go to the app store, search MyChart, open the app, select [place], and log in with your MyChart username and password.

## 2024-09-25 ENCOUNTER — Inpatient Hospital Stay

## 2024-09-25 VITALS — BP 110/69 | HR 90 | Temp 98.0°F | Resp 17

## 2024-09-25 DIAGNOSIS — C9310 Chronic myelomonocytic leukemia not having achieved remission: Secondary | ICD-10-CM | POA: Diagnosis not present

## 2024-09-25 DIAGNOSIS — Z5111 Encounter for antineoplastic chemotherapy: Secondary | ICD-10-CM | POA: Diagnosis not present

## 2024-09-25 MED ORDER — SODIUM CHLORIDE 0.9% FLUSH: 10.0000 mL | INTRAVENOUS | Status: DC | PRN

## 2024-09-25 MED ORDER — SODIUM CHLORIDE 0.9 % IV SOLN
75.0000 mg/m2 | Freq: Once | INTRAVENOUS | Status: AC
  Administered 2024-09-25: 114 mg via INTRAVENOUS
  Filled 2024-09-25: qty 11.4

## 2024-09-25 MED ORDER — ONDANSETRON HCL 4 MG/2ML IJ SOLN
8.0000 mg | Freq: Once | INTRAMUSCULAR | Status: AC
  Administered 2024-09-25: 8 mg via INTRAVENOUS
  Filled 2024-09-25: qty 4

## 2024-09-25 MED ORDER — SODIUM CHLORIDE 0.9 % IV SOLN: Freq: Once | INTRAVENOUS | Status: AC

## 2024-09-25 NOTE — Patient Instructions (Signed)
 [facility] - A DEPT OF [facility]  Discharge Instructions: Thank you for choosing [facility] to provide your oncology and hematology care.   If you have a lab appointment with the Cancer Center, please go directly to the Cancer Center and check in at the registration area.   Wear comfortable clothing and clothing appropriate for easy access to any Portacath or PICC line.   We strive to give you quality time with your provider. You may need to reschedule your appointment if you arrive late (15 or more minutes).  Arriving late affects you and other patients whose appointments are after yours.  Also, if you miss three or more appointments without notifying the office, you may be dismissed from the clinic at the provider's discretion.      For prescription refill requests, have your pharmacy contact our office and allow 72 hours for refills to be completed.    Today you received the following chemotherapy and/or immunotherapy agents vidaza       To help prevent nausea and vomiting after your treatment, we encourage you to take your nausea medication as directed.  BELOW ARE SYMPTOMS THAT SHOULD BE REPORTED IMMEDIATELY: *FEVER GREATER THAN 100.4 F (38 C) OR HIGHER *CHILLS OR SWEATING *NAUSEA AND VOMITING THAT IS NOT CONTROLLED WITH YOUR NAUSEA MEDICATION *UNUSUAL SHORTNESS OF BREATH *UNUSUAL BRUISING OR BLEEDING *URINARY PROBLEMS (pain or burning when urinating, or frequent urination) *BOWEL PROBLEMS (unusual diarrhea, constipation, pain near the anus) TENDERNESS IN MOUTH AND THROAT WITH OR WITHOUT PRESENCE OF ULCERS (sore throat, sores in mouth, or a toothache) UNUSUAL RASH, SWELLING OR PAIN  UNUSUAL VAGINAL DISCHARGE OR ITCHING   Items with * indicate a potential emergency and should be followed up as soon as possible or go to the Emergency Department if any problems should occur.  Please show the CHEMOTHERAPY ALERT CARD or IMMUNOTHERAPY  ALERT CARD at check-in to the Emergency Department and triage nurse.  Should you have questions after your visit or need to cancel or reschedule your appointment, please contact [facility] - [facility]  Dept: [phone]  and follow the prompts.  Office hours are 8:00 a.m. to 4:30 p.m. Monday - Friday. Please note that voicemails left after 4:00 p.m. may not be returned until the following business day.  We are closed weekends and major holidays. You have access to a nurse at all times for urgent questions. Please call the main number to the clinic Dept: [phone] and follow the prompts.   For any non-urgent questions, you may also contact your provider using MyChart. We now offer e-Visits for anyone 14 and older to request care online for non-urgent symptoms. For details visit [email or web address].   Also download the MyChart app! Go to the app store, search MyChart, open the app, select [place], and log in with your MyChart username and password.

## 2024-09-26 ENCOUNTER — Inpatient Hospital Stay

## 2024-09-26 VITALS — BP 96/74 | HR 73 | Temp 98.1°F | Resp 18

## 2024-09-26 DIAGNOSIS — Z5111 Encounter for antineoplastic chemotherapy: Secondary | ICD-10-CM | POA: Diagnosis not present

## 2024-09-26 DIAGNOSIS — C9310 Chronic myelomonocytic leukemia not having achieved remission: Secondary | ICD-10-CM | POA: Diagnosis not present

## 2024-09-26 MED ORDER — SODIUM CHLORIDE 0.9 % IV SOLN: Freq: Once | INTRAVENOUS | Status: AC

## 2024-09-26 MED ORDER — SODIUM CHLORIDE 0.9 % IV SOLN
75.0000 mg/m2 | Freq: Once | INTRAVENOUS | Status: AC
  Administered 2024-09-26: 114 mg via INTRAVENOUS
  Filled 2024-09-26: qty 11.4

## 2024-09-26 MED ORDER — ONDANSETRON HCL 4 MG/2ML IJ SOLN
8.0000 mg | Freq: Once | INTRAMUSCULAR | Status: AC
  Administered 2024-09-26: 8 mg via INTRAVENOUS
  Filled 2024-09-26: qty 4

## 2024-09-26 NOTE — Patient Instructions (Signed)
 [facility] - A DEPT OF [facility]  Discharge Instructions: Thank you for choosing [facility] to provide your oncology and hematology care.   If you have a lab appointment with the Cancer Center, please go directly to the Cancer Center and check in at the registration area.   Wear comfortable clothing and clothing appropriate for easy access to any Portacath or PICC line.   We strive to give you quality time with your provider. You may need to reschedule your appointment if you arrive late (15 or more minutes).  Arriving late affects you and other patients whose appointments are after yours.  Also, if you miss three or more appointments without notifying the office, you may be dismissed from the clinic at the provider's discretion.      For prescription refill requests, have your pharmacy contact our office and allow 72 hours for refills to be completed.    Today you received the following chemotherapy and/or immunotherapy agents: azaCITIDine  (VIDAZA )     To help prevent nausea and vomiting after your treatment, we encourage you to take your nausea medication as directed.  BELOW ARE SYMPTOMS THAT SHOULD BE REPORTED IMMEDIATELY: *FEVER GREATER THAN 100.4 F (38 C) OR HIGHER *CHILLS OR SWEATING *NAUSEA AND VOMITING THAT IS NOT CONTROLLED WITH YOUR NAUSEA MEDICATION *UNUSUAL SHORTNESS OF BREATH *UNUSUAL BRUISING OR BLEEDING *URINARY PROBLEMS (pain or burning when urinating, or frequent urination) *BOWEL PROBLEMS (unusual diarrhea, constipation, pain near the anus) TENDERNESS IN MOUTH AND THROAT WITH OR WITHOUT PRESENCE OF ULCERS (sore throat, sores in mouth, or a toothache) UNUSUAL RASH, SWELLING OR PAIN  UNUSUAL VAGINAL DISCHARGE OR ITCHING   Items with * indicate a potential emergency and should be followed up as soon as possible or go to the Emergency Department if any problems should occur.  Please show the CHEMOTHERAPY ALERT CARD or  IMMUNOTHERAPY ALERT CARD at check-in to the Emergency Department and triage nurse.  Should you have questions after your visit or need to cancel or reschedule your appointment, please contact [facility]  Dept: [phone]  and follow the prompts.  Office hours are 8:00 a.m. to 4:30 p.m. Monday - Friday. Please note that voicemails left after 4:00 p.m. may not be returned until the following business day.  We are closed weekends and major holidays. You have access to a nurse at all times for urgent questions. Please call the main number to the clinic Dept: [phone] and follow the prompts.   For any non-urgent questions, you may also contact your provider using MyChart. We now offer e-Visits for anyone 77 and older to request care online for non-urgent symptoms. For details visit [email or web address].   Also download the MyChart app! Go to the app store, search MyChart, open the app, select [place], and log in with your MyChart username and password.

## 2024-10-02 DIAGNOSIS — C9310 Chronic myelomonocytic leukemia not having achieved remission: Secondary | ICD-10-CM | POA: Diagnosis not present

## 2024-10-02 DIAGNOSIS — C9311 Chronic myelomonocytic leukemia, in remission: Secondary | ICD-10-CM | POA: Diagnosis not present

## 2024-10-11 DIAGNOSIS — Z23 Encounter for immunization: Secondary | ICD-10-CM | POA: Diagnosis not present

## 2024-10-13 DIAGNOSIS — M0579 Rheumatoid arthritis with rheumatoid factor of multiple sites without organ or systems involvement: Secondary | ICD-10-CM | POA: Diagnosis not present

## 2024-11-03 ENCOUNTER — Inpatient Hospital Stay

## 2024-11-03 ENCOUNTER — Inpatient Hospital Stay (HOSPITAL_BASED_OUTPATIENT_CLINIC_OR_DEPARTMENT_OTHER): Admitting: Hematology

## 2024-11-03 ENCOUNTER — Inpatient Hospital Stay: Admitting: Nurse Practitioner

## 2024-11-03 ENCOUNTER — Encounter: Payer: Self-pay | Admitting: Hematology

## 2024-11-03 ENCOUNTER — Inpatient Hospital Stay: Attending: Nurse Practitioner

## 2024-11-03 VITALS — BP 93/47 | HR 59 | Temp 98.3°F | Resp 16 | Wt 105.3 lb

## 2024-11-03 DIAGNOSIS — Z5111 Encounter for antineoplastic chemotherapy: Secondary | ICD-10-CM | POA: Insufficient documentation

## 2024-11-03 DIAGNOSIS — Z79899 Other long term (current) drug therapy: Secondary | ICD-10-CM | POA: Insufficient documentation

## 2024-11-03 DIAGNOSIS — C9310 Chronic myelomonocytic leukemia not having achieved remission: Secondary | ICD-10-CM | POA: Diagnosis present

## 2024-11-03 LAB — BASIC METABOLIC PANEL - CANCER CENTER ONLY
Anion gap: 8 (ref 5–15)
BUN: 25 mg/dL — ABNORMAL HIGH (ref 8–23)
CO2: 29 mmol/L (ref 22–32)
Calcium: 9.1 mg/dL (ref 8.9–10.3)
Chloride: 101 mmol/L (ref 98–111)
Creatinine: 1.28 mg/dL — ABNORMAL HIGH (ref 0.44–1.00)
GFR, Estimated: 43 mL/min — ABNORMAL LOW (ref >60)
Glucose, Bld: 113 mg/dL — ABNORMAL HIGH (ref 70–99)
Potassium: 4.7 mmol/L (ref 3.5–5.1)
Sodium: 138 mmol/L (ref 135–145)

## 2024-11-03 LAB — CBC WITH DIFFERENTIAL (CANCER CENTER ONLY)
Abs Immature Granulocytes: 5.77 K/uL — ABNORMAL HIGH (ref 0.00–0.07)
Basophils Absolute: 1 K/uL — ABNORMAL HIGH (ref 0.0–0.1)
Basophils Relative: 3 %
Eosinophils Absolute: 1.1 K/uL — ABNORMAL HIGH (ref 0.0–0.5)
Eosinophils Relative: 3 %
HCT: 31.8 % — ABNORMAL LOW (ref 36.0–46.0)
Hemoglobin: 10.3 g/dL — ABNORMAL LOW (ref 12.0–15.0)
Immature Granulocytes: 15 %
Lymphocytes Relative: 6 %
Lymphs Abs: 2.3 K/uL (ref 0.7–4.0)
MCH: 32.8 pg (ref 26.0–34.0)
MCHC: 32.4 g/dL (ref 30.0–36.0)
MCV: 101.3 fL — ABNORMAL HIGH (ref 80.0–100.0)
Monocytes Absolute: 11.9 K/uL — ABNORMAL HIGH (ref 0.1–1.0)
Monocytes Relative: 31 %
Neutro Abs: 17 K/uL — ABNORMAL HIGH (ref 1.7–7.7)
Neutrophils Relative %: 42 %
Platelet Count: 200 K/uL (ref 150–400)
RBC: 3.14 MIL/uL — ABNORMAL LOW (ref 3.87–5.11)
RDW: 17 % — ABNORMAL HIGH (ref 11.5–15.5)
WBC Count: 39.1 K/uL — ABNORMAL HIGH (ref 4.0–10.5)
nRBC: 0.1 % (ref 0.0–0.2)

## 2024-11-03 MED ORDER — ONDANSETRON HCL 4 MG/2ML IJ SOLN
8.0000 mg | Freq: Once | INTRAMUSCULAR | Status: AC
  Administered 2024-11-03: 8 mg via INTRAVENOUS
  Filled 2024-11-03: qty 4

## 2024-11-03 MED ORDER — SODIUM CHLORIDE 0.9 % IV SOLN
75.0000 mg/m2 | Freq: Once | INTRAVENOUS | Status: AC
  Administered 2024-11-03: 114 mg via INTRAVENOUS
  Filled 2024-11-03: qty 11.4

## 2024-11-03 MED ORDER — SODIUM CHLORIDE 0.9 % IV SOLN: Freq: Once | INTRAVENOUS | Status: AC

## 2024-11-03 NOTE — Progress Notes (Signed)
 [facility]   [phone] [phone]   Clinic Follow up Note   Patient Care Team: [name], MD as PCP - General (Internal Medicine) [name], [name], MD (Inactive) as PCP - Cardiology (Cardiology) [name], MD as Consulting Physician (Rheumatology) [name], MD as Consulting Physician (Hematology) [name], MD as Consulting Physician (Gastroenterology) [name], DO as Consulting Physician (Pulmonary Disease) Pickenpack-Cousar, [name], NP as Nurse Practitioner [facility] and Palliative Medicine)  Date of Service:  11/03/2024  CHIEF COMPLAINT: f/u of CMML  CURRENT THERAPY:  Azacitidine  every 6 weeks  Oncology History   CMML (chronic myelomonocytic leukemia) (HCC) diagnosed in 10/2021 ---She developed worsening back pain, pelvic MRI 07/04/21 showed a mass in the presacral space. 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 Her BM biopsy was reviewed at [facility] and was felt to be CMML-1 with 5% blasts.  -She began azacitadine on 02/06/22. she has had multiple hospital admissions and her performance status has been very low.  Changed her chemo to every 6 weeks in 08/2022 due to her fatigue. -she is tolerating every 6 weeks much better -she saw Dr. [name] in Feb 2024, who recommends to continue current therapy  -Due to worsening low back pain, she completed palliative radiation on 07/02/2023 -due to worsening low back pain, she was hospitalized on November 12, 2023.  Currently follow-up with palliative care for pain control.  Assessment & Plan Chronic myelomonocytic leukemia not having achieved remission Chronic myelomonocytic leukemia with elevated white blood cell count, predominantly neutrophils, and increased monocytes. Hemoglobin is stable at 10.3, and  platelet count is normal. No current need for transfusion. Concern for potential worsening of CMML due to elevated blood counts. No neutropenia or anemia present. - Continue current treatment regimen. - Monitor blood counts for any further increase or development of low blood counts. - Will consider repeat pulmonary biopsy if low blood counts develop.  Chronic low back pain Chronic low back pain, currently tolerable and managed with Tylenol . No new changes in pain management or severity reported. - Continue current pain management with Tylenol .  Plan - She is clinically stable, pain is manageable. - Lab reviewed, her WBC has increased lately, with predominant neutrophils and monocytes, probably related to her CMML. - Will proceed I decided today and continue every 6 weeks for now. - Up in 6 weeks   SUMMARY OF ONCOLOGIC HISTORY: Oncology History Overview Note   Cancer Staging  CMML (chronic myelomonocytic leukemia) (HCC) Staging form: Chronic Myeloid Leukemia, AJCC 8th Edition - Clinical stage from 01/12/2022: Bone marrow blast count (%): 12, Additional clonal changes: Unknown - Signed by [name], MD on 01/12/2022 Stage prefix: Initial diagnosis     CMML (chronic myelomonocytic leukemia) (HCC)  11/09/2021 Initial Biopsy   DIAGNOSIS:   -  Monoclonal B-cell population with co-expression of CD5 comprises 17%  of all lymphocytes  -  See comment   COMMENT:  In addition to the clonal B-cell population, there is a myeloblast  population (CD34, CD38, HLA-DR, CD117, CD123 and CD33) that comprises 2% of the total cellular events.  Please see concurrent tissue biopsy (below) for additional work-up and final diagnosis.    FINAL MICROSCOPIC DIAGNOSIS:   A. SOFT TISSUE MASS, PRE SACRAL, NEEDLE CORE BIOPSY:  -  Chronic lymphocytic leukemia/small lymphocytic lymphoma  -  Extra medullary hematopoiesis  -  See  comment   COMMENT:  The biopsy consists of multiple soft tissue cores with  lymphoid nodules and a dense hematopoietic infiltrate consistent with extra medullary hematopoiesis.  MPO and E-cadherin highlight myeloid and erythroid precursors respectively.  CD34 highlights increased vasculature and is also positive within the cytoplasm of megakaryocytes.  A few small, immature mononuclear cells appear to be positive for CD34 and CD117. TdT shows rare, scattered positive cells.  CD20 highlights aggregates of B cells which are admixed with CD3 positive T cells.  T cells are an admixture of CD4 and CD8.  The B cells are also positive for CD5, CD23 and Bcl-2.  The B cells do not show significant staining for CD10, BCL6 or cyclin D1.  CD138 highlights scattered plasma cells which are polytypic by kappa and lambda in situ hybridization.  Flow cytometry performed on the sample (see WL [id number]) identified a kappa restricted CD5 positive B-cell population comprising 70% of lymphocytes.  In addition, a small myeloblast population comprised 2% of the total cellular events.   Overall, the findings are consistent with soft tissue involvement by  chronic lymphocytic leukemia/small lymphocytic lymphoma and extra medullary hematopoiesis. In reviewing the patient's CBC data (macrocytic anemia and thrombocytopenia), I would recommend a bone marrow biopsy to assess for marrow involvement by CLL/SLL.    12/23/2021 Imaging   EXAM: CT CHEST, ABDOMEN, AND PELVIS WITH CONTRAST  IMPRESSION: 1. Slight interval enlargement of a presacral soft tissue mass measuring 7.2 x 4.7 cm, previously 6.9 x 4.1 cm on prior MR of the pelvis dated 07/04/2021. By report, this represents a biopsy proven lymphoma. 2. Pleural nodule of the dependent right lower lobe overlying the posterior right tenth rib and pleural or paraspinous soft tissue mass overlying the right aspect of the T10 vertebral body, very slightly enlarged compared to prior examination of the chest dated 06/25/2020, consistent with additional sites of  lymphomatous involvement given very indolent growth. These could be better assessed for metabolic activity by FDG PET/CT if desired. 3. There is mild, bibasilar predominant pulmonary fibrosis in a pattern featuring irregular peripheral interstitial opacity, septal thickening, but without clear evidence of subpleural bronchiolectasis or honeycombing, with a somewhat asymmetric distribution most conspicuously involving the right lower lobe and lingula. These findings are significantly worsened when compared to prior examination dated 06/25/2020, particularly in the right lower lobe. Given interval change, this may reflect sequelae of interval infection or aspiration, however appearance is generally suspicious for fibrotic interstitial lung disease, and if characterized by ATS pulmonary fibrosis is in an indeterminate for UIP pattern, differential considerations including both UIP and NSIP. 4. Emphysema.   Aortic Atherosclerosis (ICD10-I70.0) and Emphysema (ICD10-J43.9).   01/05/2022 Pathology Results   DIAGNOSIS:   BONE MARROW, ASPIRATE, CLOT, CORE:  -Hypercellular bone marrow for age with features of  myelodysplastic/myeloproliferative neoplasm  -Minor abnormal B-cell population  -See comment   PERIPHERAL BLOOD:  -Macrocytic anemia  -Neutrophilic left shift and monocytosis  -Thrombocytopenia   COMMENT:  The bone marrow is hypercellular for age with dyspoietic changes  involving myeloid cell lines associated with monocytosis and increased number of blastic cells (12%) as primarily seen by morphology, many of which display monocytic features.  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  In this background, there are several predominantly small lymphoid aggregates mostly composed of small lymphoid cells.  By flow  cytometry, a minor abnormal B-cell population expressing CD5 is  seen and representing 2% of all cells.  This correlate with previously known B-cell lymphoproliferative process.  Correlation with cytogenetic and FISH studies is strongly recommended.    DIAGNOSIS:   -Increased number of monocytic cells present (25%)  -Minor abnormal B-cell population identified.  -See comment   COMMENT:  Flow cytometric analysis shows increased number of monocytic cells representing 25% of all cells but without aberrant phenotype or CD34 expression.  A significant CD34-positive blastic population is not identified.  The lymphoid population shows a minor B-cell population representing 2% of all cells and expressing B-cell antigens including CD20 associated with CD5, CD200 and possibly dim kappa expression.  The latter findings are abnormal and correlate with previously known B-cell lymphoproliferative process.  No significant T-cell phenotypic abnormalities identified.    01/12/2022 Initial Diagnosis   CMML (chronic myelomonocytic leukemia) (HCC)   01/12/2022 Cancer Staging   Staging form: Chronic Myeloid Leukemia, AJCC 8th Edition - Clinical stage from 01/12/2022: Bone marrow blast count (%): 12, Additional clonal changes: Unknown - Signed by [name], MD on 01/12/2022 Stage prefix: Initial diagnosis   02/06/2022 - 08/11/2022 Chemotherapy   Patient is on Treatment Plan : MYELODYSPLASIA  Azacitidine  IV D1-7 q28d     02/06/2022 -  Chemotherapy   Patient is on Treatment Plan : MYELODYSPLASIA  Azacitidine  IV D1-7 q28d     06/03/2023 Imaging   MR lumbar spine with and without contrast IMPRESSION: 1. Long-standing presacral mass, at least doubled in size since 2016, now at least 8 cm in length. There is internal fat components and differential considerations include myelolipoma, liposarcoma, and teratoma. 2. 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 3. L2-3 left paracentral  extrusion compressing the left L3 nerve root. 4. Right foraminal impingement at L3-4 to L4-5, greatest at L4-5. Right subarticular recess narrowing at L3-4 and L4-5.   07/09/2023 Imaging   CT abdomen and pelvis with contrast  IMPRESSION: No acute findings in the abdomen or pelvis. Specifically, no findings to explain the patient's history of pain. 2. Interval progression of presacral and right thoracic paraspinal soft tissue lesions compatible with mild progression of disease since 06/19/2022. Small posterior right thoracic chest wall lesion also mildly progressive. 3. Mild circumferential wall thickening distal esophagus. Esophagitis would be a consideration. 4.  Aortic Atherosclerosis (ICD10-I70.0).   05/12/2024 Imaging   CT chest w/o contrast IMPRESSION: 1. Moderate emphysema. 2. Interval development of a single mildly enlarged left axillary lymph node, nonspecific. While this is not meet size criteria for pathologic enlargement, it is rounded shape and asymmetric cortical thickening suggests involvement with the patient's underlying chronic myelogenous leukemia. 3. Multiple subpleural/paraspinal masses within the right hemithorax demonstrating slight interval increase in size since prior examination. These may reflect areas of extra medullary hematopoiesis, which is favored given its relatively slow progression since prior examination, or leukemic deposits. 4. Mild cardiomegaly.  Trace interstitial pulmonary edema. 5. Cholelithiasis. 6. Small hiatal hernia.        Discussed the use of AI scribe software for clinical note transcription with the patient, who gave verbal consent to proceed.  History of Present Illness [name] is a 78 year old female with chronic myelomonocytic leukemia (CMML) who presents for a follow-up visit. She is accompanied by her daughter.  Recent blood tests show an elevated white blood cell count of 39, predominantly neutrophils and increased  monocytes. Hemoglobin remains stable at 10.3, and platelet count is normal. She continues on 5 mg of prednisone  daily without changes.  Chronic back pain is present but tolerable, managed with Tylenol . Creatinine levels are slightly elevated but not significantly concerning. Hydration is maintained with a jug of water by her chair. No current skin issues are noted.  She has received both flu and COVID vaccinations. Appetite is adequate, though she eats less than she believes she can. No fever is present.     All other systems were reviewed with the patient and are negative.  MEDICAL HISTORY:  Past Medical History:  Diagnosis Date   Arthritis    Rheumatoid arthritis   Celiac disease    Chronic kidney disease    stage 3 from MD notes   COPD (chronic obstructive pulmonary disease) (HCC)    Dyspnea    with going up stairs   Family history of adverse reaction to anesthesia    father had hard time waking up   Headache    sinus headaches   Hot flashes    Hypertension    Iron deficiency anemia    Pneumonia    per patient I have walking pneumonia    SURGICAL HISTORY: Past Surgical History:  Procedure Laterality Date   COLONOSCOPY     ECTOPIC PREGNANCY SURGERY      x 2   IR IMAGING GUIDED PORT INSERTION  01/31/2022   REVERSE SHOULDER ARTHROPLASTY Right 02/02/2017   Procedure: RIGHT REVERSE SHOULDER ARTHROPLASTY;  Surgeon: Marcey Her, MD;  Location: [facility] OR;  Service: Orthopedics;  Laterality: Right;    I have reviewed the social history and family history with the patient and they are unchanged from previous note.  ALLERGIES:  is allergic to digoxin  and related, gluten meal, penicillins, fexofenadine, alendronate, and hydroxychloroquine.  MEDICATIONS:  Current Outpatient Medications  Medication Sig Dispense Refill   acetaminophen  (TYLENOL ) 500 MG tablet Take 2 tablets (1,000 mg total) by mouth 3 (three) times daily. 30 tablet 0   apixaban  (ELIQUIS ) 5 MG TABS tablet TAKE 1  TABLET(5 MG) BY MOUTH TWICE DAILY 60 tablet 5   azaCITIDine  (VIDAZA ) 100 MG SUSR 114mg  Injection every 6 weeks     denosumab  (PROLIA ) 60 MG/ML SOSY injection Inject 60 mg into the skin every 6 (six) months.     diltiazem  (CARDIZEM  CD) 360 MG 24 hr capsule Take 1 capsule (360 mg total) by mouth daily. 90 capsule 0   furosemide  (LASIX ) 20 MG tablet Take 1 tablet (20 mg total) by mouth daily. 30 tablet 11   gabapentin  (NEURONTIN ) 300 MG capsule Take 300 mg by mouth in the morning.     inFLIXimab  (REMICADE ) 100 MG injection Inject into the vein See admin instructions.     loratadine  (CLARITIN ) 10 MG tablet Take 10 mg by mouth daily.     Metoprolol  Tartrate 75 MG TABS Take 2 tablets (150 mg total) by mouth 2 (two) times daily. 360 tablet 1   ondansetron  (ZOFRAN ) 4 MG tablet Take 1 tablet (4 mg total) by mouth every 8 (eight) hours as needed for nausea or vomiting. 20 tablet 1   predniSONE  (DELTASONE ) 5 MG tablet Take 5 mg by mouth daily with breakfast.     Tiotropium Bromide-Olodaterol (STIOLTO RESPIMAT ) 2.5-2.5 MCG/ACT AERS Inhale 2 puffs into the lungs daily. 4 g 11   No current facility-administered medications for this visit.   Facility-Administered Medications Ordered in Other Visits  Medication Dose Route Frequency Provider Last Rate Last Admin   acetaminophen  (TYLENOL ) 325 MG tablet            diphenhydrAMINE  (BENADRYL ) 25 mg  capsule             PHYSICAL EXAMINATION: ECOG PERFORMANCE STATUS: 2 - Symptomatic, <50% confined to bed  Vitals:   11/03/24 1103  BP: (!) 93/47  Pulse: (!) 59  Resp: 16  Temp: 98.3 F (36.8 C)  SpO2: 100%   Wt Readings from Last 3 Encounters:  11/03/24 105 lb 4.8 oz (47.8 kg)  09/22/24 104 lb 9.6 oz (47.4 kg)  09/22/24 105 lb (47.6 kg)     GENERAL:alert, no distress and comfortable SKIN: skin color, texture, turgor are normal, no rashes or significant lesions EYES: normal, Conjunctiva are pink and non-injected, sclera clear NECK: supple, thyroid   normal size, non-tender, without nodularity LYMPH:  no palpable lymphadenopathy in the cervical, axillary  LUNGS: clear to auscultation and percussion with normal breathing effort HEART: regular rate & rhythm and no murmurs and no lower extremity edema ABDOMEN:abdomen soft, non-tender and normal bowel sounds Musculoskeletal:no cyanosis of digits and no clubbing  NEURO: alert & oriented x 3 with fluent speech, no focal motor/sensory deficits  Physical Exam CHEST: Lungs with crackles at bases.  LABORATORY DATA:  I have reviewed the data as listed    Latest Ref Rng & Units 11/03/2024   10:22 AM 09/22/2024   12:45 PM 08/07/2024    1:42 PM  CBC  WBC 4.0 - 10.5 K/uL 39.1  37.9  24.8   Hemoglobin 12.0 - 15.0 g/dL 89.6  88.7  89.1   Hematocrit 36.0 - 46.0 % 31.8  34.3  34.2   Platelets 150 - 400 K/uL 200  269  195         Latest Ref Rng & Units 11/03/2024   10:22 AM 09/22/2024   12:45 PM 08/07/2024    1:42 PM  CMP  Glucose 70 - 99 mg/dL 886  887  844   BUN 8 - 23 mg/dL 25  23  20    Creatinine 0.44 - 1.00 mg/dL 8.71  8.71  8.90   Sodium 135 - 145 mmol/L 138  138  139   Potassium 3.5 - 5.1 mmol/L 4.7  4.3  3.9   Chloride 98 - 111 mmol/L 101  103  104   CO2 22 - 32 mmol/L 29  27  28    Calcium  8.9 - 10.3 mg/dL 9.1  9.1  8.6       RADIOGRAPHIC STUDIES: I have personally reviewed the radiological images as listed and agreed with the findings in the report. No results found.    No orders of the defined types were placed in this encounter.  All questions were answered. The patient knows to call the clinic with any problems, questions or concerns. No barriers to learning was detected. The total time spent in the appointment was 25 minutes, including review of chart and various tests results, discussions about plan of care and coordination of care plan     Onita Mattock, MD 11/03/2024

## 2024-11-03 NOTE — Assessment & Plan Note (Signed)
 diagnosed in 10/2021 ---She developed worsening back pain, pelvic MRI 07/04/21 showed a mass in the presacral space. 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  -She began azacitadine on 02/06/22. she has had multiple hospital admissions and her performance status has been very low.  Changed her chemo to every 6 weeks in 08/2022 due to her fatigue. -she is tolerating every 6 weeks much better -she saw Dr. Sharyne Richters in Feb 2024, who recommends to continue current therapy  -Due to worsening low back pain, she completed palliative radiation on 07/02/2023 -due to worsening low back pain, she was hospitalized on November 12, 2023.  Currently follow-up with palliative care for pain control.

## 2024-11-03 NOTE — Patient Instructions (Signed)
 [facility] - A DEPT OF [facility]  Discharge Instructions: Thank you for choosing [facility] to provide your oncology and hematology care.   If you have a lab appointment with the Cancer Center, please go directly to the Cancer Center and check in at the registration area.   Wear comfortable clothing and clothing appropriate for easy access to any Portacath or PICC line.   We strive to give you quality time with your provider. You may need to reschedule your appointment if you arrive late (15 or more minutes).  Arriving late affects you and other patients whose appointments are after yours.  Also, if you miss three or more appointments without notifying the office, you may be dismissed from the clinic at the provider's discretion.      For prescription refill requests, have your pharmacy contact our office and allow 72 hours for refills to be completed.    Today you received the following chemotherapy and/or immunotherapy agents: Vidaza      To help prevent nausea and vomiting after your treatment, we encourage you to take your nausea medication as directed.  BELOW ARE SYMPTOMS THAT SHOULD BE REPORTED IMMEDIATELY: *FEVER GREATER THAN 100.4 F (38 C) OR HIGHER *CHILLS OR SWEATING *NAUSEA AND VOMITING THAT IS NOT CONTROLLED WITH YOUR NAUSEA MEDICATION *UNUSUAL SHORTNESS OF BREATH *UNUSUAL BRUISING OR BLEEDING *URINARY PROBLEMS (pain or burning when urinating, or frequent urination) *BOWEL PROBLEMS (unusual diarrhea, constipation, pain near the anus) TENDERNESS IN MOUTH AND THROAT WITH OR WITHOUT PRESENCE OF ULCERS (sore throat, sores in mouth, or a toothache) UNUSUAL RASH, SWELLING OR PAIN  UNUSUAL VAGINAL DISCHARGE OR ITCHING   Items with * indicate a potential emergency and should be followed up as soon as possible or go to the Emergency Department if any problems should occur.  Please show the CHEMOTHERAPY ALERT CARD or IMMUNOTHERAPY  ALERT CARD at check-in to the Emergency Department and triage nurse.  Should you have questions after your visit or need to cancel or reschedule your appointment, please contact [facility] - A DEPT OF [facility]  Dept: [phone]  and follow the prompts.  Office hours are 8:00 a.m. to 4:30 p.m. Monday - Friday. Please note that voicemails left after 4:00 p.m. may not be returned until the following business day.  We are closed weekends and major holidays. You have access to a nurse at all times for urgent questions. Please call the main number to the clinic Dept: [phone] and follow the prompts.   For any non-urgent questions, you may also contact your provider using MyChart. We now offer e-Visits for anyone 5 and older to request care online for non-urgent symptoms. For details visit [email or web address].   Also download the MyChart app! Go to the app store, search "MyChart", open the app, select [facility], and log in with your MyChart username and password.

## 2024-11-04 ENCOUNTER — Inpatient Hospital Stay

## 2024-11-04 VITALS — BP 122/70 | HR 73 | Temp 97.8°F | Resp 18

## 2024-11-04 DIAGNOSIS — C9310 Chronic myelomonocytic leukemia not having achieved remission: Secondary | ICD-10-CM | POA: Diagnosis not present

## 2024-11-04 DIAGNOSIS — Z5111 Encounter for antineoplastic chemotherapy: Secondary | ICD-10-CM | POA: Diagnosis not present

## 2024-11-04 DIAGNOSIS — Z79899 Other long term (current) drug therapy: Secondary | ICD-10-CM | POA: Diagnosis not present

## 2024-11-04 MED ORDER — SODIUM CHLORIDE 0.9 % IV SOLN
75.0000 mg/m2 | Freq: Once | INTRAVENOUS | Status: AC
  Administered 2024-11-04: 114 mg via INTRAVENOUS
  Filled 2024-11-04: qty 11.4

## 2024-11-04 MED ORDER — SODIUM CHLORIDE 0.9 % IV SOLN: Freq: Once | INTRAVENOUS | Status: AC

## 2024-11-04 MED ORDER — ONDANSETRON HCL 4 MG/2ML IJ SOLN
8.0000 mg | Freq: Once | INTRAMUSCULAR | Status: AC
  Administered 2024-11-04: 8 mg via INTRAVENOUS
  Filled 2024-11-04: qty 4

## 2024-11-05 ENCOUNTER — Inpatient Hospital Stay

## 2024-11-05 VITALS — BP 113/44 | HR 67 | Temp 98.0°F | Resp 18

## 2024-11-05 DIAGNOSIS — C9310 Chronic myelomonocytic leukemia not having achieved remission: Secondary | ICD-10-CM | POA: Diagnosis not present

## 2024-11-05 DIAGNOSIS — Z79899 Other long term (current) drug therapy: Secondary | ICD-10-CM | POA: Diagnosis not present

## 2024-11-05 DIAGNOSIS — Z5111 Encounter for antineoplastic chemotherapy: Secondary | ICD-10-CM | POA: Diagnosis not present

## 2024-11-05 MED ORDER — SODIUM CHLORIDE 0.9 % IV SOLN: Freq: Once | INTRAVENOUS | Status: AC

## 2024-11-05 MED ORDER — ONDANSETRON HCL 4 MG/2ML IJ SOLN
8.0000 mg | Freq: Once | INTRAMUSCULAR | Status: AC
  Administered 2024-11-05: 8 mg via INTRAVENOUS
  Filled 2024-11-05: qty 4

## 2024-11-05 MED ORDER — SODIUM CHLORIDE 0.9 % IV SOLN
75.0000 mg/m2 | Freq: Once | INTRAVENOUS | Status: AC
  Administered 2024-11-05: 114 mg via INTRAVENOUS
  Filled 2024-11-05: qty 11.4

## 2024-11-05 NOTE — Patient Instructions (Signed)
 [facility] - A DEPT OF [facility]  Discharge Instructions: Thank you for choosing [facility] to provide your oncology and hematology care.   If you have a lab appointment with the Cancer Center, please go directly to the Cancer Center and check in at the registration area.   Wear comfortable clothing and clothing appropriate for easy access to any Portacath or PICC line.   We strive to give you quality time with your provider. You may need to reschedule your appointment if you arrive late (15 or more minutes).  Arriving late affects you and other patients whose appointments are after yours.  Also, if you miss three or more appointments without notifying the office, you may be dismissed from the clinic at the provider's discretion.      For prescription refill requests, have your pharmacy contact our office and allow 72 hours for refills to be completed.    Today you received the following chemotherapy and/or immunotherapy agents: Vidaza      To help prevent nausea and vomiting after your treatment, we encourage you to take your nausea medication as directed.  BELOW ARE SYMPTOMS THAT SHOULD BE REPORTED IMMEDIATELY: *FEVER GREATER THAN 100.4 F (38 C) OR HIGHER *CHILLS OR SWEATING *NAUSEA AND VOMITING THAT IS NOT CONTROLLED WITH YOUR NAUSEA MEDICATION *UNUSUAL SHORTNESS OF BREATH *UNUSUAL BRUISING OR BLEEDING *URINARY PROBLEMS (pain or burning when urinating, or frequent urination) *BOWEL PROBLEMS (unusual diarrhea, constipation, pain near the anus) TENDERNESS IN MOUTH AND THROAT WITH OR WITHOUT PRESENCE OF ULCERS (sore throat, sores in mouth, or a toothache) UNUSUAL RASH, SWELLING OR PAIN  UNUSUAL VAGINAL DISCHARGE OR ITCHING   Items with * indicate a potential emergency and should be followed up as soon as possible or go to the Emergency Department if any problems should occur.  Please show the CHEMOTHERAPY ALERT CARD or IMMUNOTHERAPY  ALERT CARD at check-in to the Emergency Department and triage nurse.  Should you have questions after your visit or need to cancel or reschedule your appointment, please contact [facility] - A DEPT OF [facility]  Dept: [phone]  and follow the prompts.  Office hours are 8:00 a.m. to 4:30 p.m. Monday - Friday. Please note that voicemails left after 4:00 p.m. may not be returned until the following business day.  We are closed weekends and major holidays. You have access to a nurse at all times for urgent questions. Please call the main number to the clinic Dept: [phone] and follow the prompts.   For any non-urgent questions, you may also contact your provider using MyChart. We now offer e-Visits for anyone 5 and older to request care online for non-urgent symptoms. For details visit [email or web address].   Also download the MyChart app! Go to the app store, search "MyChart", open the app, select [facility], and log in with your MyChart username and password.

## 2024-11-06 ENCOUNTER — Inpatient Hospital Stay

## 2024-11-06 VITALS — BP 100/60 | HR 60 | Temp 98.0°F | Resp 16 | Wt 106.0 lb

## 2024-11-06 DIAGNOSIS — C9310 Chronic myelomonocytic leukemia not having achieved remission: Secondary | ICD-10-CM | POA: Diagnosis not present

## 2024-11-06 DIAGNOSIS — Z5111 Encounter for antineoplastic chemotherapy: Secondary | ICD-10-CM | POA: Diagnosis not present

## 2024-11-06 DIAGNOSIS — Z79899 Other long term (current) drug therapy: Secondary | ICD-10-CM | POA: Diagnosis not present

## 2024-11-06 MED ORDER — SODIUM CHLORIDE 0.9 % IV SOLN: Freq: Once | INTRAVENOUS | Status: AC

## 2024-11-06 MED ORDER — SODIUM CHLORIDE 0.9 % IV SOLN
75.0000 mg/m2 | Freq: Once | INTRAVENOUS | Status: AC
  Administered 2024-11-06: 114 mg via INTRAVENOUS
  Filled 2024-11-06: qty 11.4

## 2024-11-06 MED ORDER — ONDANSETRON HCL 4 MG/2ML IJ SOLN
8.0000 mg | Freq: Once | INTRAMUSCULAR | Status: AC
  Administered 2024-11-06: 8 mg via INTRAVENOUS
  Filled 2024-11-06: qty 4

## 2024-11-06 NOTE — Progress Notes (Signed)
 Pt bp was lower at discharge, pt stated feel the same, seen by Mallie ORN pa, ok to dc home

## 2024-11-06 NOTE — Patient Instructions (Signed)
 [facility] - A DEPT OF [facility]  Discharge Instructions: Thank you for choosing [facility] to provide your oncology and hematology care.   If you have a lab appointment with the Cancer Center, please go directly to the Cancer Center and check in at the registration area.   Wear comfortable clothing and clothing appropriate for easy access to any Portacath or PICC line.   We strive to give you quality time with your provider. You may need to reschedule your appointment if you arrive late (15 or more minutes).  Arriving late affects you and other patients whose appointments are after yours.  Also, if you miss three or more appointments without notifying the office, you may be dismissed from the clinic at the provider's discretion.      For prescription refill requests, have your pharmacy contact our office and allow 72 hours for refills to be completed.    Today you received the following chemotherapy and/or immunotherapy agents: Vidaza      To help prevent nausea and vomiting after your treatment, we encourage you to take your nausea medication as directed.  BELOW ARE SYMPTOMS THAT SHOULD BE REPORTED IMMEDIATELY: *FEVER GREATER THAN 100.4 F (38 C) OR HIGHER *CHILLS OR SWEATING *NAUSEA AND VOMITING THAT IS NOT CONTROLLED WITH YOUR NAUSEA MEDICATION *UNUSUAL SHORTNESS OF BREATH *UNUSUAL BRUISING OR BLEEDING *URINARY PROBLEMS (pain or burning when urinating, or frequent urination) *BOWEL PROBLEMS (unusual diarrhea, constipation, pain near the anus) TENDERNESS IN MOUTH AND THROAT WITH OR WITHOUT PRESENCE OF ULCERS (sore throat, sores in mouth, or a toothache) UNUSUAL RASH, SWELLING OR PAIN  UNUSUAL VAGINAL DISCHARGE OR ITCHING   Items with * indicate a potential emergency and should be followed up as soon as possible or go to the Emergency Department if any problems should occur.  Please show the CHEMOTHERAPY ALERT CARD or IMMUNOTHERAPY  ALERT CARD at check-in to the Emergency Department and triage nurse.  Should you have questions after your visit or need to cancel or reschedule your appointment, please contact [facility] - A DEPT OF [facility]  Dept: [phone]  and follow the prompts.  Office hours are 8:00 a.m. to 4:30 p.m. Monday - Friday. Please note that voicemails left after 4:00 p.m. may not be returned until the following business day.  We are closed weekends and major holidays. You have access to a nurse at all times for urgent questions. Please call the main number to the clinic Dept: [phone] and follow the prompts.   For any non-urgent questions, you may also contact your provider using MyChart. We now offer e-Visits for anyone 5 and older to request care online for non-urgent symptoms. For details visit [email or web address].   Also download the MyChart app! Go to the app store, search "MyChart", open the app, select [facility], and log in with your MyChart username and password.

## 2024-11-07 ENCOUNTER — Inpatient Hospital Stay

## 2024-11-07 ENCOUNTER — Encounter: Payer: Self-pay | Admitting: Hematology

## 2024-11-07 VITALS — BP 109/64 | HR 72 | Temp 97.9°F | Resp 16

## 2024-11-07 DIAGNOSIS — C9310 Chronic myelomonocytic leukemia not having achieved remission: Secondary | ICD-10-CM

## 2024-11-07 DIAGNOSIS — Z5111 Encounter for antineoplastic chemotherapy: Secondary | ICD-10-CM | POA: Diagnosis not present

## 2024-11-07 DIAGNOSIS — Z79899 Other long term (current) drug therapy: Secondary | ICD-10-CM | POA: Diagnosis not present

## 2024-11-07 MED ORDER — SODIUM CHLORIDE 0.9 % IV SOLN
75.0000 mg/m2 | Freq: Once | INTRAVENOUS | Status: AC
  Administered 2024-11-07: 114 mg via INTRAVENOUS
  Filled 2024-11-07: qty 11.4

## 2024-11-07 MED ORDER — SODIUM CHLORIDE 0.9 % IV SOLN: Freq: Once | INTRAVENOUS | Status: AC

## 2024-11-07 MED ORDER — ONDANSETRON HCL 4 MG/2ML IJ SOLN
8.0000 mg | Freq: Once | INTRAMUSCULAR | Status: AC
  Administered 2024-11-07: 8 mg via INTRAVENOUS
  Filled 2024-11-07: qty 4

## 2024-11-07 NOTE — Patient Instructions (Signed)
 [facility]   Discharge Instructions: Thank you for choosing [facility] to provide your oncology and hematology care.   If you have a lab appointment with the Cancer Center, please go directly to the Cancer Center and check in at the registration area.   Wear comfortable clothing and clothing appropriate for easy access to any Portacath or PICC line.   We strive to give you quality time with your provider. You may need to reschedule your appointment if you arrive late (15 or more minutes).  Arriving late affects you and other patients whose appointments are after yours.  Also, if you miss three or more appointments without notifying the office, you may be dismissed from the clinic at the provider's discretion.      For prescription refill requests, have your pharmacy contact our office and allow 72 hours for refills to be completed.    Today you received the following chemotherapy and/or immunotherapy agents: Azacitidine (Vidaza)      To help prevent nausea and vomiting after your treatment, we encourage you to take your nausea medication as directed.  BELOW ARE SYMPTOMS THAT SHOULD BE REPORTED IMMEDIATELY: *FEVER GREATER THAN 100.4 F (38 C) OR HIGHER *CHILLS OR SWEATING *NAUSEA AND VOMITING THAT IS NOT CONTROLLED WITH YOUR NAUSEA MEDICATION *UNUSUAL SHORTNESS OF BREATH *UNUSUAL BRUISING OR BLEEDING *URINARY PROBLEMS (pain or burning when urinating, or frequent urination) *BOWEL PROBLEMS (unusual diarrhea, constipation, pain near the anus) TENDERNESS IN MOUTH AND THROAT WITH OR WITHOUT PRESENCE OF ULCERS (sore throat, sores in mouth, or a toothache) UNUSUAL RASH, SWELLING OR PAIN  UNUSUAL VAGINAL DISCHARGE OR ITCHING   Items with * indicate a potential emergency and should be followed up as soon as possible or go to the Emergency Department if any problems should occur.  Please show the CHEMOTHERAPY ALERT CARD or  IMMUNOTHERAPY ALERT CARD at check-in to the Emergency Department and triage nurse.  Should you have questions after your visit or need to cancel or reschedule your appointment, please contact [facility] - A DEPT OF [facility]  Dept: [phone]  and follow the prompts.  Office hours are 8:00 a.m. to 4:30 p.m. Monday - Friday. Please note that voicemails left after 4:00 p.m. may not be returned until the following business day.  We are closed weekends and major holidays. You have access to a nurse at all times for urgent questions. Please call the main number to the clinic Dept: [phone] and follow the prompts.   For any non-urgent questions, you may also contact your provider using MyChart. We now offer e-Visits for anyone 1 and older to request care online for non-urgent symptoms. For details visit [email or web address].   Also download the MyChart app! Go to the app store, search "MyChart", open the app, select [place], and log in with your MyChart username and password.

## 2024-11-13 ENCOUNTER — Telehealth: Payer: Self-pay | Admitting: Hematology

## 2024-11-13 NOTE — Telephone Encounter (Signed)
 Called  pt and confirm  time and dates.

## 2024-11-17 ENCOUNTER — Other Ambulatory Visit: Payer: Self-pay

## 2024-11-19 MED ORDER — METOPROLOL TARTRATE 75 MG PO TABS: 150.0000 mg | ORAL_TABLET | Freq: Two times a day (BID) | ORAL | 3 refills | Status: DC

## 2024-11-21 ENCOUNTER — Other Ambulatory Visit: Payer: Self-pay | Admitting: Hematology

## 2024-11-21 ENCOUNTER — Telehealth: Payer: Self-pay | Admitting: Nurse Practitioner

## 2024-11-21 ENCOUNTER — Encounter: Payer: Self-pay | Admitting: Hematology

## 2024-11-21 DIAGNOSIS — C9310 Chronic myelomonocytic leukemia not having achieved remission: Secondary | ICD-10-CM

## 2024-11-21 NOTE — Telephone Encounter (Signed)
 Spoke with the patient and her husband over the phone to discuss new symptoms. She has decreased energy. She is complaining of generalized muscle weakness. Her husband states that she is sleeping much more than normal. She denies fevers, chills, night sweats, joint pain, or appetite decrease. She does believe she has lost some weight. She is interested in having a physical therapy consultation to help her build up her strength. I think this is reasonable request and will begin this process on Monday. I also asked that she be seen for office visit with labs. She may be anemic or dehydrated. We can evaluate that through lab work. I will have our scheduler reach out to her to set this up. The patient and her husband are agreeable to this and understand that it would likely be Monday when they were contacted for appointment. I advised her to be seen in the emergency department if she developed fever, chills, nausea and vomiting, or diarrhea over the weekend. Because she could get dehydrated very quickly and develop infection, these symptoms should be attended to emergently. She and her husband were both agreeable to this plan. All questions were answered.  -Powell Lessen, NP{

## 2024-11-24 ENCOUNTER — Other Ambulatory Visit: Payer: Self-pay | Admitting: Nurse Practitioner

## 2024-11-24 DIAGNOSIS — C9310 Chronic myelomonocytic leukemia not having achieved remission: Secondary | ICD-10-CM

## 2024-11-24 DIAGNOSIS — R8271 Bacteriuria: Secondary | ICD-10-CM

## 2024-11-24 NOTE — Progress Notes (Unsigned)
 [name] Care Team: [name], MD as PCP - General (Internal Medicine) [name], [name], MD (Inactive) as PCP - Cardiology (Cardiology) [name], MD as Consulting Physician (Rheumatology) [name], MD as Consulting Physician (Hematology) [name], MD as Consulting Physician (Gastroenterology) [name], DO as Consulting Physician (Pulmonary Disease) Pickenpack-Cousar, [name], NP as Nurse Practitioner [facility] and Palliative Medicine)  Clinic Day:  11/25/2024  Referring physician: [name], MD  ASSESSMENT & PLAN:   Assessment & Plan: CMML (chronic myelomonocytic leukemia) (HCC) diagnosed in 10/2021 ---[name] developed worsening back pain, pelvic MRI 07/04/21 showed a mass in Sherry presacral space. 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 [name] BM biopsy was reviewed at [facility] and was felt to be CMML-1 with 5% blasts.  -[name] began azacitadine on 02/06/22. [name] has had multiple hospital admissions and [name] performance status has been very low.  Changed [name] chemo to every 6 weeks in 08/2022 due to [name] fatigue. -[name] is tolerating every 6 weeks much better -[name] saw Dr. [name] in Feb 2024, who recommends to continue current therapy  -Due to worsening low back pain, [name] completed palliative radiation on 07/02/2023 -due to worsening low back pain, [name] was hospitalized on November 12, 2023.  Currently follow-up with palliative care for pain control.   Weakness [name] has left shoulder weakness with limited mobility. [name] states that [name] needs to have shoulder replacement surgery, but currently not recommended due to chemotherapy. [name] had Sherry right shoulder replaced in Sherry past and has excellent range of motion. 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 [name] states this causes a great deal of  back pain. [name] states that it is difficult for [name] to stand up straight. [name] uses a walker to help with mobility. [name] has had physical therapy in Sherry past and it was very helpful. [name] has noted that strength and balance seem to be getting worse. [name] recommended another round of physical therapy at home to help build strength and balance. This was initially ordered last Friday.   Urinary tract infection  Urine sample positive for large leukocytes indicating a probable urinary tract infection. This may be Sherry cause of increased fatigue and weakness [name] experienced after chemotherapy. Started macrobid BID for 7 days. Will adjust antibiotic based on culture results.   CMML Blood work results reviewed with [name] and [name]. Improved WBC at 30.5 with anc 12.8. stable Hgb 10.4 and Hct 32.5. Renal functions abnormal, but also improved.   Plan: Labs reviewed. -CBC improved  -renal functions improved with BUN 24, creatinine 1.13, and eGFR 50.  Treat for uti with macrobid and adjust as indicated.  Referral to physical therapy.  Labs/flush, follow up, and next treatment as scheduled.   [name] understands Sherry plans discussed today and is in agreement with them.  [name] knows to contact our office if [name] develops concerns prior to [name] next appointment.  [name] provided 25 minutes of face-to-face time during this encounter and > 50% was spent counseling as documented under my assessment and plan.    Powell FORBES Lessen, NP  [facility] - A DEPT OF [facility] [address] [place] [address] Dept: [phone] Dept Fax: [phone]   No orders of Sherry defined types were placed in this encounter.  CHIEF COMPLAINT:  CC: CMML  Current Treatment:  Azacitidine  every 6 weeks   INTERVAL HISTORY:  [name] is here today for repeat clinical assessment. [name] has been experiencing increased weakness and tiredness since [name]  last infusion which was 11/03/2024. Generally, [name] improves after a few days. [name] states every cycle is a little different. This cycle, [name] symptoms seem to be getting worse with time. [name] and [name] are considering a physical therapy consultation. [name] spoke to [name] on 11/21/2024 over Sherry phone. [name] states that since then, [name] symptoms have improved. [name] feels stronger, [name] is hungry, and [name] has much more energy. [name] denies chest pain, chest pressure, or shortness of breath. [name] denies headaches or visual disturbances. [name] denies abdominal pain, nausea, vomiting, or changes in bowel or bladder habits.  [name] denies fevers or chills. [name] denies pain. [name] weight has been stable.  [name] have reviewed Sherry past medical history, past surgical history, social history and family history with [name] and they are unchanged from previous note.  ALLERGIES:  is allergic to digoxin  and related, gluten meal, penicillins, fexofenadine, alendronate, and hydroxychloroquine.  MEDICATIONS:  Current Outpatient Medications  Medication Sig Dispense Refill   acetaminophen  (TYLENOL ) 500 MG tablet Take 2 tablets (1,000 mg total) by mouth 3 (three) times daily. 30 tablet 0   apixaban  (ELIQUIS ) 5 MG TABS tablet TAKE 1 TABLET(5 MG) BY MOUTH TWICE DAILY ([name] taking differently: Take 5 mg by mouth 2 (two) times daily.) 60 tablet 5   azaCITIDine  (VIDAZA ) 100 MG SUSR 114mg  Injection every 6 weeks     denosumab  (PROLIA ) 60 MG/ML SOSY injection Inject 60 mg into Sherry skin every 6 (six) months.     diltiazem  (CARDIZEM  CD) 360 MG 24 hr capsule Take 1 capsule (360 mg total) by mouth daily. 90 capsule 0   furosemide  (LASIX ) 20 MG tablet Take 1 tablet (20 mg total) by mouth daily. 30 tablet 11   gabapentin  (NEURONTIN ) 300 MG capsule Take 300 mg by mouth in Sherry morning.     inFLIXimab  (REMICADE ) 100 MG injection Inject into Sherry vein See admin instructions.     loratadine  (CLARITIN ) 10 MG tablet Take 10 mg by mouth daily.      loratadine  (CLARITIN ) 10 MG tablet Take 10 mg by mouth daily.     Metoprolol  Tartrate 75 MG TABS Take 2 tablets (150 mg total) by mouth 2 (two) times daily. 360 tablet 3   nitrofurantoin, macrocrystal-monohydrate, (MACROBID) 100 MG capsule Take 1 capsule (100 mg total) by mouth 2 (two) times daily. 14 capsule 0   ondansetron  (ZOFRAN ) 4 MG tablet Take 1 tablet (4 mg total) by mouth every 8 (eight) hours as needed for nausea or vomiting. 20 tablet 1   predniSONE  (DELTASONE ) 5 MG tablet Take 5 mg by mouth daily with breakfast.     Tiotropium Bromide-Olodaterol (STIOLTO RESPIMAT ) 2.5-2.5 MCG/ACT AERS Inhale 2 puffs into Sherry lungs daily. 4 g 11   No current facility-administered medications for this visit.   Facility-Administered Medications Ordered in Other Visits  Medication Dose Route Frequency Provider Last Rate Last Admin   acetaminophen  (TYLENOL ) 325 MG tablet            diphenhydrAMINE  (BENADRYL ) 25 mg capsule             HISTORY OF PRESENT ILLNESS:   Oncology History Overview Note   Cancer Staging  CMML (chronic myelomonocytic leukemia) (HCC) Staging form: Chronic Myeloid Leukemia, AJCC 8th Edition - Clinical stage  from 01/12/2022: Bone marrow blast count (%): 12, Additional clonal changes: Unknown - Signed by [name], MD on 01/12/2022 Stage prefix: Initial diagnosis     CMML (chronic myelomonocytic leukemia) (HCC)  11/09/2021 Initial Biopsy   DIAGNOSIS:   -  Monoclonal B-cell population with co-expression of CD5 comprises 17%  of all lymphocytes  -  See comment   COMMENT:  In addition to Sherry clonal B-cell population, there is a myeloblast  population (CD34, CD38, HLA-DR, CD117, CD123 and CD33) that comprises 2% of Sherry total cellular events.  Please see concurrent tissue biopsy (below) for additional work-up and final diagnosis.    FINAL MICROSCOPIC DIAGNOSIS:   A. SOFT TISSUE MASS, PRE SACRAL, NEEDLE CORE BIOPSY:  -  Chronic lymphocytic leukemia/small lymphocytic  lymphoma  -  Extra medullary hematopoiesis  -  See comment   COMMENT:  Sherry biopsy consists of multiple soft tissue cores with lymphoid nodules and a dense hematopoietic infiltrate consistent with extra medullary hematopoiesis.  MPO and E-cadherin highlight myeloid and erythroid precursors respectively.  CD34 highlights increased vasculature and is also positive within Sherry cytoplasm of megakaryocytes.  A few small, immature mononuclear cells appear to be positive for CD34 and CD117. TdT shows rare, scattered positive cells.  CD20 highlights aggregates of B cells which are admixed with CD3 positive T cells.  T cells are an admixture of CD4 and CD8.  Sherry B cells are also positive for CD5, CD23 and Bcl-2.  Sherry B cells do not show significant staining for CD10, BCL6 or cyclin D1.  CD138 highlights scattered plasma cells which are polytypic by kappa and lambda in situ hybridization.  Flow cytometry performed on Sherry sample (see [facility] [id number]) identified a kappa restricted CD5 positive B-cell population comprising 70% of lymphocytes.  In addition, a small myeloblast population comprised 2% of Sherry total cellular events.   Overall, Sherry findings are consistent with soft tissue involvement by  chronic lymphocytic leukemia/small lymphocytic lymphoma and extra medullary hematopoiesis. In reviewing [name]'s CBC data (macrocytic anemia and thrombocytopenia), [name] would recommend a bone marrow biopsy to assess for marrow involvement by CLL/SLL.    12/23/2021 Imaging   EXAM: CT CHEST, ABDOMEN, AND PELVIS WITH CONTRAST  IMPRESSION: 1. Slight interval enlargement of a presacral soft tissue mass measuring 7.2 x 4.7 cm, previously 6.9 x 4.1 cm on prior MR of Sherry pelvis dated 07/04/2021. By report, this represents a biopsy proven lymphoma. 2. Pleural nodule of Sherry dependent right lower lobe overlying Sherry posterior right tenth rib and pleural or paraspinous soft tissue mass overlying Sherry right aspect of Sherry T10  vertebral body, very slightly enlarged compared to prior examination of Sherry chest dated 06/25/2020, consistent with additional sites of lymphomatous involvement given very indolent growth. These could be better assessed for metabolic activity by FDG PET/CT if desired. 3. There is mild, bibasilar predominant pulmonary fibrosis in a pattern featuring irregular peripheral interstitial opacity, septal thickening, but without clear evidence of subpleural bronchiolectasis or honeycombing, with a somewhat asymmetric distribution most conspicuously involving Sherry right lower lobe and lingula. These findings are significantly worsened when compared to prior examination dated 06/25/2020, particularly in Sherry right lower lobe. Given interval change, this may reflect sequelae of interval infection or aspiration, however appearance is generally suspicious for fibrotic interstitial lung disease, and if characterized by ATS pulmonary fibrosis is in an indeterminate for UIP pattern, differential considerations including both UIP and NSIP. 4. Emphysema.   Aortic Atherosclerosis (ICD10-I70.0) and Emphysema (ICD10-J43.9).   01/05/2022  Pathology Results   DIAGNOSIS:   BONE MARROW, ASPIRATE, CLOT, CORE:  -Hypercellular bone marrow for age with features of  myelodysplastic/myeloproliferative neoplasm  -Minor abnormal B-cell population  -See comment   PERIPHERAL BLOOD:  -Macrocytic anemia  -Neutrophilic left shift and monocytosis  -Thrombocytopenia   COMMENT:  Sherry bone marrow is hypercellular for age with dyspoietic changes  involving myeloid cell lines associated with monocytosis and increased number of blastic cells (12%) as primarily seen by morphology, many of which display monocytic features.  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  In this background, there are several predominantly small lymphoid aggregates mostly composed of small lymphoid cells.  By flow cytometry, a minor abnormal B-cell population expressing CD5 is seen and representing 2% of all cells.  This correlate with previously known B-cell lymphoproliferative process.  Correlation with cytogenetic and FISH studies is strongly recommended.    DIAGNOSIS:   -Increased number of monocytic cells present (25%)  -Minor abnormal B-cell population identified.  -See comment   COMMENT:  Flow cytometric analysis shows increased number of monocytic cells representing 25% of all cells but without aberrant phenotype or CD34 expression.  A significant CD34-positive blastic population is not identified.  Sherry lymphoid population shows a minor B-cell population representing 2% of all cells and expressing B-cell antigens including CD20 associated with CD5, CD200 and possibly dim kappa expression.  Sherry latter findings are abnormal and correlate with previously known B-cell lymphoproliferative process.  No significant T-cell phenotypic abnormalities identified.    01/12/2022 Initial Diagnosis   CMML (chronic myelomonocytic leukemia) (HCC)   01/12/2022 Cancer Staging   Staging form: Chronic Myeloid Leukemia, AJCC 8th Edition - Clinical stage from 01/12/2022: Bone marrow blast count (%): 12, Additional clonal changes: Unknown - Signed by [name], MD on 01/12/2022 Stage prefix: Initial diagnosis   02/06/2022 - 08/11/2022 Chemotherapy   [name] is on Treatment Plan : MYELODYSPLASIA  Azacitidine  IV D1-7 q28d     02/06/2022 -  Chemotherapy   [name] is on Treatment Plan : MYELODYSPLASIA  Azacitidine  IV D1-7 q28d     06/03/2023 Imaging   MR lumbar spine with and without contrast IMPRESSION: 1. Long-standing presacral mass, at least doubled in size since 2016, now at least 8 cm in length. There is internal fat components and differential considerations include myelolipoma,  liposarcoma, and teratoma. 2. 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 3. L2-3 left paracentral extrusion compressing Sherry left L3 nerve root. 4. Right foraminal impingement at L3-4 to L4-5, greatest at L4-5. Right subarticular recess narrowing at L3-4 and L4-5.   07/09/2023 Imaging   CT abdomen and pelvis with contrast  IMPRESSION: No acute findings in Sherry abdomen or pelvis. Specifically, no findings to explain [name]'s history of pain. 2. Interval progression of presacral and right thoracic paraspinal soft tissue lesions compatible with mild progression of disease since 06/19/2022. Small posterior right thoracic chest wall lesion also mildly progressive. 3. Mild circumferential wall thickening distal esophagus. Esophagitis would be a consideration. 4.  Aortic Atherosclerosis (ICD10-I70.0).   05/12/2024 Imaging   CT chest w/o contrast IMPRESSION: 1. Moderate emphysema. 2. Interval development of a single mildly enlarged left axillary lymph node, nonspecific. While this is not meet size criteria for pathologic enlargement, it is rounded shape and asymmetric cortical thickening suggests involvement with [name]'s underlying chronic myelogenous leukemia. 3. Multiple subpleural/paraspinal masses within Sherry right hemithorax  demonstrating slight interval increase in size since prior examination. These may reflect areas of extra medullary hematopoiesis, which is favored given its relatively slow progression since prior examination, or leukemic deposits. 4. Mild cardiomegaly.  Trace interstitial pulmonary edema. 5. Cholelithiasis. 6. Small hiatal hernia.         REVIEW OF SYSTEMS:   Constitutional: Denies fevers, chills or abnormal weight loss. Was feeling fatigue and weakness. This has resolved.  Eyes: Denies blurriness of vision Ears, nose, mouth, throat, and face: Denies mucositis or sore throat Respiratory: Denies cough, dyspnea or  wheezes Cardiovascular: Denies palpitation, chest discomfort or lower extremity swelling Gastrointestinal:  Denies nausea, heartburn or change in bowel habits Skin: Denies abnormal skin rashes Lymphatics: Denies new lymphadenopathy or easy bruising Neurological:Denies numbness, tingling or new weaknesses Behavioral/Psych: Mood is stable, no new changes  All other systems were reviewed with [name] and are negative.   VITALS:   Today's Vitals   11/25/24 1007 11/25/24 1009  BP: 100/65   Pulse: 96   Resp: 16   Temp: (!) 97.3 F (36.3 C)   TempSrc: Temporal   SpO2: 99%   Weight: 105 lb (47.6 kg)   PainSc:  0-No pain   Body mass index is 18.6 kg/m.    Wt Readings from Last 3 Encounters:  11/25/24 105 lb (47.6 kg)  11/06/24 106 lb (48.1 kg)  11/03/24 105 lb 4.8 oz (47.8 kg)    Body mass index is 18.6 kg/m.  Performance status (ECOG): 2 - Symptomatic, <50% confined to bed  PHYSICAL EXAM:   GENERAL:alert, no distress and comfortable SKIN: skin color, texture, turgor are normal, no rashes or significant lesions EYES: normal, Conjunctiva are pink and non-injected, sclera clear OROPHARYNX:no exudate, no erythema and lips, buccal mucosa, and tongue normal  NECK: supple, thyroid  normal size, non-tender, without nodularity LYMPH:  no palpable lymphadenopathy in Sherry cervical, axillary or inguinal LUNGS: clear to auscultation and percussion with normal breathing effort HEART: regular rate & rhythm and no murmurs and no lower extremity edema ABDOMEN:abdomen soft, non-tender and normal bowel sounds Musculoskeletal:no cyanosis of digits and no clubbing. Left shoulder tenderness and reduced ROM. Uses a rollator walker for gait stability.  NEURO: alert & oriented x 3 with fluent speech, no focal motor/sensory deficits  LABORATORY DATA:  [name] have reviewed Sherry data as listed    Component Value Date/Time   NA 138 11/25/2024 0938   NA 141 01/04/2016 1001   K 4.8 11/25/2024 0938   K  4.5 01/04/2016 1001   CL 100 11/25/2024 0938   CO2 26 11/25/2024 0938   CO2 24 01/04/2016 1001   GLUCOSE 147 (H) 11/25/2024 0938   GLUCOSE 105 01/04/2016 1001   BUN 24 (H) 11/25/2024 0938   BUN 21.7 01/04/2016 1001   CREATININE 1.13 (H) 11/25/2024 0938   CREATININE 1.2 (H) 01/04/2016 1001   CALCIUM  9.2 11/25/2024 0938   CALCIUM  9.7 01/04/2016 1001   PROT 6.4 (L) 04/04/2024 0903   PROT 7.0 01/04/2016 1001   ALBUMIN 3.6 04/04/2024 0903   ALBUMIN 4.0 01/04/2016 1001   AST 20 04/04/2024 0903   AST 21 12/21/2023 1359   AST 26 01/04/2016 1001   ALT 14 04/04/2024 0903   ALT 14 12/21/2023 1359   ALT 24 01/04/2016 1001   ALKPHOS 40 04/04/2024 0903   ALKPHOS 61 01/04/2016 1001   BILITOT 1.1 04/04/2024 0903   BILITOT 0.8 12/21/2023 1359   BILITOT 0.41 01/04/2016 1001   GFRNONAA 50 (L) 11/25/2024 9061  GFRAA 56 (L) 01/09/2020 1339    Lab Results  Component Value Date   WBC 30.5 (H) 11/25/2024   NEUTROABS 12.8 (H) 11/25/2024   HGB 10.4 (L) 11/25/2024   HCT 32.5 (L) 11/25/2024   MCV 104.8 (H) 11/25/2024   PLT 185 11/25/2024    Urinalysis    Component Value Date/Time   COLORURINE YELLOW 11/25/2024 0940   APPEARANCEUR HAZY (A) 11/25/2024 0940   LABSPEC 1.006 11/25/2024 0940   PHURINE 6.0 11/25/2024 0940   GLUCOSEU NEGATIVE 11/25/2024 0940   HGBUR SMALL (A) 11/25/2024 0940   BILIRUBINUR NEGATIVE 11/25/2024 0940   KETONESUR NEGATIVE 11/25/2024 0940   PROTEINUR NEGATIVE 11/25/2024 0940   UROBILINOGEN 0.2 04/28/2011 1859   NITRITE NEGATIVE 11/25/2024 0940   LEUKOCYTESUR LARGE (A) 11/25/2024 0940

## 2024-11-25 ENCOUNTER — Other Ambulatory Visit: Payer: Self-pay

## 2024-11-25 ENCOUNTER — Inpatient Hospital Stay: Attending: Nurse Practitioner

## 2024-11-25 ENCOUNTER — Inpatient Hospital Stay: Attending: Nurse Practitioner | Admitting: Nurse Practitioner

## 2024-11-25 ENCOUNTER — Encounter: Payer: Self-pay | Admitting: Nurse Practitioner

## 2024-11-25 VITALS — BP 100/65 | HR 96 | Temp 97.3°F | Resp 16 | Wt 105.0 lb

## 2024-11-25 DIAGNOSIS — Z79899 Other long term (current) drug therapy: Secondary | ICD-10-CM | POA: Insufficient documentation

## 2024-11-25 DIAGNOSIS — R8271 Bacteriuria: Secondary | ICD-10-CM

## 2024-11-25 DIAGNOSIS — C9310 Chronic myelomonocytic leukemia not having achieved remission: Secondary | ICD-10-CM

## 2024-11-25 DIAGNOSIS — Z5111 Encounter for antineoplastic chemotherapy: Secondary | ICD-10-CM | POA: Insufficient documentation

## 2024-11-25 DIAGNOSIS — N390 Urinary tract infection, site not specified: Secondary | ICD-10-CM | POA: Insufficient documentation

## 2024-11-25 DIAGNOSIS — R531 Weakness: Secondary | ICD-10-CM | POA: Insufficient documentation

## 2024-11-25 LAB — CBC WITH DIFFERENTIAL (CANCER CENTER ONLY)
Abs Immature Granulocytes: 3.78 K/uL — ABNORMAL HIGH (ref 0.00–0.07)
Basophils Absolute: 0.7 K/uL — ABNORMAL HIGH (ref 0.0–0.1)
Basophils Relative: 2 %
Eosinophils Absolute: 0.8 K/uL — ABNORMAL HIGH (ref 0.0–0.5)
Eosinophils Relative: 3 %
HCT: 32.5 % — ABNORMAL LOW (ref 36.0–46.0)
Hemoglobin: 10.4 g/dL — ABNORMAL LOW (ref 12.0–15.0)
Immature Granulocytes: 12 %
Lymphocytes Relative: 7 %
Lymphs Abs: 2.2 K/uL (ref 0.7–4.0)
MCH: 33.5 pg (ref 26.0–34.0)
MCHC: 32 g/dL (ref 30.0–36.0)
MCV: 104.8 fL — ABNORMAL HIGH (ref 80.0–100.0)
Monocytes Absolute: 10.1 K/uL — ABNORMAL HIGH (ref 0.1–1.0)
Monocytes Relative: 33 %
Neutro Abs: 12.8 K/uL — ABNORMAL HIGH (ref 1.7–7.7)
Neutrophils Relative %: 43 %
Platelet Count: 185 K/uL (ref 150–400)
RBC: 3.1 MIL/uL — ABNORMAL LOW (ref 3.87–5.11)
RDW: 18.9 % — ABNORMAL HIGH (ref 11.5–15.5)
WBC Count: 30.5 K/uL — ABNORMAL HIGH (ref 4.0–10.5)
nRBC: 0.1 % (ref 0.0–0.2)

## 2024-11-25 LAB — URINALYSIS, COMPLETE (UACMP) WITH MICROSCOPIC
Bilirubin Urine: NEGATIVE
Glucose, UA: NEGATIVE mg/dL
Ketones, ur: NEGATIVE mg/dL
Nitrite: NEGATIVE
Protein, ur: NEGATIVE mg/dL
Specific Gravity, Urine: 1.006 (ref 1.005–1.030)
WBC, UA: >50 WBC/hpf (ref 0–5)
pH: 6 (ref 5.0–8.0)

## 2024-11-25 LAB — BASIC METABOLIC PANEL - CANCER CENTER ONLY
Anion gap: 11 (ref 5–15)
BUN: 24 mg/dL — ABNORMAL HIGH (ref 8–23)
CO2: 26 mmol/L (ref 22–32)
Calcium: 9.2 mg/dL (ref 8.9–10.3)
Chloride: 100 mmol/L (ref 98–111)
Creatinine: 1.13 mg/dL — ABNORMAL HIGH (ref 0.44–1.00)
GFR, Estimated: 50 mL/min — ABNORMAL LOW (ref >60)
Glucose, Bld: 147 mg/dL — ABNORMAL HIGH (ref 70–99)
Potassium: 4.8 mmol/L (ref 3.5–5.1)
Sodium: 138 mmol/L (ref 135–145)

## 2024-11-25 MED ORDER — NITROFURANTOIN MONOHYD MACRO 100 MG PO CAPS: 100.0000 mg | ORAL_CAPSULE | Freq: Two times a day (BID) | ORAL | 0 refills | Status: DC

## 2024-11-25 NOTE — Assessment & Plan Note (Signed)
 diagnosed in 10/2021 ---She developed worsening back pain, pelvic MRI 07/04/21 showed a mass in the presacral space. 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  -She began azacitadine on 02/06/22. she has had multiple hospital admissions and her performance status has been very low.  Changed her chemo to every 6 weeks in 08/2022 due to her fatigue. -she is tolerating every 6 weeks much better -she saw Dr. Sharyne Richters in Feb 2024, who recommends to continue current therapy  -Due to worsening low back pain, she completed palliative radiation on 07/02/2023 -due to worsening low back pain, she was hospitalized on November 12, 2023.  Currently follow-up with palliative care for pain control.

## 2024-11-26 ENCOUNTER — Other Ambulatory Visit: Payer: Self-pay

## 2024-11-26 DIAGNOSIS — C9310 Chronic myelomonocytic leukemia not having achieved remission: Secondary | ICD-10-CM

## 2024-11-27 ENCOUNTER — Other Ambulatory Visit: Payer: Self-pay

## 2024-11-27 LAB — URINE CULTURE: Culture: >=100000 — AB

## 2024-11-27 NOTE — Progress Notes (Signed)
 Contacted [facility] to see if they could do PT for this patient. They have set up an appointment to go out to see the patient 11/27/24 per [facility].

## 2024-12-09 ENCOUNTER — Telehealth: Payer: Self-pay

## 2024-12-09 NOTE — Telephone Encounter (Signed)
 Pt and spouse called wanting to know if Dr Lanny wanted the pt to do her 5th Vidaza  injection on 12/22/2025 since the pt will miss the 12/18/2025 injection d/t the Infusion Center being closed for Christmas.

## 2024-12-11 DIAGNOSIS — M0579 Rheumatoid arthritis with rheumatoid factor of multiple sites without organ or systems involvement: Secondary | ICD-10-CM | POA: Diagnosis not present

## 2024-12-14 NOTE — Progress Notes (Unsigned)
 "     [facility]   [phone] [phone]    Patient Care Team: Sherry Velna SAUNDERS, MD as PCP - General (Internal Medicine) [name], [name], MD (Inactive) as PCP - Cardiology (Cardiology) Sherry Slough, MD as Consulting Physician (Rheumatology) Sherry Callander, MD as Consulting Physician (Hematology) Sherry Lamprey, MD as Consulting Physician (Gastroenterology) Sherry [name], DO as Consulting Physician (Pulmonary Disease) Pickenpack-Cousar, [name], NP as Nurse Practitioner (Hospice and Palliative Medicine)   CHIEF COMPLAINT: Follow up CMML  CURRENT THERAPY: Vidaza , dose reduced to 5 days q6 weeks   INTERVAL HISTORY [name] returns with you spouse, doing well overall. Hard to predict how she'll tolerate treatment, sometimes n/v and weakness worse than others but doing well currently. Wants to postpone PT to after the New Year. Denies urinary sx, bowel changes, new/worsening pain, s/sx infection, or neuropathy.   ROS  All other systems reviewed and negative  Past Medical History:  Diagnosis Date   Arthritis    Rheumatoid arthritis   Celiac disease    Chronic kidney disease    stage 3 from MD notes   COPD (chronic obstructive pulmonary disease) (HCC)    Dyspnea    with going up stairs   Family history of adverse reaction to anesthesia    father had hard time waking up   Headache    sinus headaches   Hot flashes    Hypertension    Iron deficiency anemia    Pneumonia    per patient I have walking pneumonia     Past Surgical History:  Procedure Laterality Date   COLONOSCOPY     ECTOPIC PREGNANCY SURGERY      x 2   IR IMAGING GUIDED PORT INSERTION  01/31/2022   REVERSE SHOULDER ARTHROPLASTY Right 02/02/2017   Procedure: RIGHT REVERSE SHOULDER ARTHROPLASTY;  Surgeon: Sherry Her, MD;  Location: [facility] OR;  Service: Orthopedics;  Laterality: Right;     Outpatient Encounter Medications as of 12/15/2024  Medication Sig   acetaminophen   (TYLENOL ) 500 MG tablet Take 2 tablets (1,000 mg total) by mouth 3 (three) times daily.   apixaban  (ELIQUIS ) 5 MG TABS tablet TAKE 1 TABLET(5 MG) BY MOUTH TWICE DAILY (Patient taking differently: Take 5 mg by mouth 2 (two) times daily.)   azaCITIDine  (VIDAZA ) 100 MG SUSR 114mg  Injection every 6 weeks   denosumab  (PROLIA ) 60 MG/ML SOSY injection Inject 60 mg into the skin every 6 (six) months.   diltiazem  (CARDIZEM  CD) 360 MG 24 hr capsule Take 1 capsule (360 mg total) by mouth daily.   furosemide  (LASIX ) 20 MG tablet Take 1 tablet (20 mg total) by mouth daily.   gabapentin  (NEURONTIN ) 300 MG capsule Take 300 mg by mouth in the morning.   inFLIXimab  (REMICADE ) 100 MG injection Inject into the vein See admin instructions.   loratadine  (CLARITIN ) 10 MG tablet Take 10 mg by mouth daily.   Metoprolol  Tartrate 75 MG TABS Take 2 tablets (150 mg total) by mouth 2 (two) times daily.   nitrofurantoin , macrocrystal-monohydrate, (MACROBID ) 100 MG capsule Take 1 capsule (100 mg total) by mouth 2 (two) times daily.   ondansetron  (ZOFRAN ) 4 MG tablet Take 1 tablet (4 mg total) by mouth every 8 (eight) hours as needed for nausea or vomiting.   predniSONE  (DELTASONE ) 5 MG tablet Take 5 mg by mouth daily with breakfast.   Tiotropium Bromide-Olodaterol (STIOLTO RESPIMAT ) 2.5-2.5 MCG/ACT AERS Inhale 2 puffs into the lungs daily.   Facility-Administered Encounter Medications  as of 12/15/2024  Medication   acetaminophen  (TYLENOL ) 325 MG tablet   diphenhydrAMINE  (BENADRYL ) 25 mg capsule     Today's Vitals   12/15/24 1137 12/15/24 1154  BP: 118/60   Pulse: 65   Resp: 17   Temp: 98.1 F (36.7 C)   SpO2: 99%   Weight: 103 lb 12.8 oz (47.1 kg)   PainSc:  0-No pain   Body mass index is 18.39 kg/m.   ECOG PERFORMANCE STATUS: 2 - Symptomatic, <50% confined to bed  PHYSICAL EXAM GENERAL:alert, no distress and comfortable SKIN: no rash  EYES: sclera clear LUNGS: clear with normal breathing effort HEART:  regular rate & rhythm, pitting bilateral lower extremity edema with dry skin  ABDOMEN: abdomen soft, non-tender and normal bowel sounds NEURO: alert & oriented x 3 with fluent speech, ambulates with walker  PAC without erythema    CBC    Latest Ref Rng & Units 12/15/2024   10:57 AM 11/25/2024    9:38 AM 11/03/2024   10:22 AM  CBC  WBC 4.0 - 10.5 K/uL 46.9  30.5  39.1   Hemoglobin 12.0 - 15.0 g/dL 9.5  89.5  89.6   Hematocrit 36.0 - 46.0 % 29.3  32.5  31.8   Platelets 150 - 400 K/uL 211  185  200       CMP     Latest Ref Rng & Units 12/15/2024   10:57 AM 11/25/2024    9:38 AM 11/03/2024   10:22 AM  CMP  Glucose 70 - 99 mg/dL 888  852  886   BUN 8 - 23 mg/dL 31  24  25    Creatinine 0.44 - 1.00 mg/dL 8.51  8.86  8.71   Sodium 135 - 145 mmol/L 138  138  138   Potassium 3.5 - 5.1 mmol/L 4.0  4.8  4.7   Chloride 98 - 111 mmol/L 100  100  101   CO2 22 - 32 mmol/L 24  26  29    Calcium  8.9 - 10.3 mg/dL 9.2  9.2  9.1       ASSESSMENT & PLAN: 78 year old female   CMML (chronic myelomonocytic leukemia) (HCC) -diagnosed in 10/2021 -She developed worsening back pain, pelvic MRI 07/04/21 showed a mass in the presacral space. 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 [name] BM biopsy was reviewed at [facility] and was felt to be CMML-1 with 5% blasts.  -She began azacitadine on 02/06/22. she has had multiple hospital admissions and [name] performance status has been very low.  Changed [name] chemo from 7 days to 5 days and q4 weeks to q6 in 08/2022 due to [name] fatigue. -she is tolerating every 6 weeks much better -she saw Dr. [name] in Feb 2024, who recommends to continue current therapy  -Due to worsening low back pain, she completed palliative radiation on 07/02/2023 -due to worsening low back pain, she was hospitalized on November 12, 2023.  Currently follow-up with palliative care for pain control. -[name] appears stable, tolerating dose reduced vidaza  well overall, with mild nausea and weakness. SEs are managed with supportive care at home. She requests to postpone PT to early 2026    PLAN: -Labs reviewed -Proceed with Vidaza  today, no dose adjustments (no chemo 12/25 and complete day 5 on 12/29) -Follow up and next cycle in 6 weeks -Will cc PT to r/s after  the holidays    All questions were answered. The patient knows to call the clinic with any problems, questions or concerns. No barriers to learning were detected.   Kanae Ignatowski K Lenor Provencher, NP 12/15/2024  "

## 2024-12-15 ENCOUNTER — Inpatient Hospital Stay

## 2024-12-15 ENCOUNTER — Inpatient Hospital Stay: Admitting: Nurse Practitioner

## 2024-12-15 ENCOUNTER — Encounter: Payer: Self-pay | Admitting: Nurse Practitioner

## 2024-12-15 VITALS — BP 118/60 | HR 65 | Temp 98.1°F | Resp 17 | Wt 103.8 lb

## 2024-12-15 DIAGNOSIS — C9310 Chronic myelomonocytic leukemia not having achieved remission: Secondary | ICD-10-CM

## 2024-12-15 DIAGNOSIS — Z5111 Encounter for antineoplastic chemotherapy: Secondary | ICD-10-CM | POA: Diagnosis not present

## 2024-12-15 LAB — BASIC METABOLIC PANEL - CANCER CENTER ONLY
Anion gap: 14 (ref 5–15)
BUN: 31 mg/dL — ABNORMAL HIGH (ref 8–23)
CO2: 24 mmol/L (ref 22–32)
Calcium: 9.2 mg/dL (ref 8.9–10.3)
Chloride: 100 mmol/L (ref 98–111)
Creatinine: 1.48 mg/dL — ABNORMAL HIGH (ref 0.44–1.00)
GFR, Estimated: 36 mL/min — ABNORMAL LOW
Glucose, Bld: 111 mg/dL — ABNORMAL HIGH (ref 70–99)
Potassium: 4 mmol/L (ref 3.5–5.1)
Sodium: 138 mmol/L (ref 135–145)

## 2024-12-15 LAB — CBC WITH DIFFERENTIAL (CANCER CENTER ONLY)
Abs Immature Granulocytes: 7.26 K/uL — ABNORMAL HIGH (ref 0.00–0.07)
Basophils Absolute: 1 K/uL — ABNORMAL HIGH (ref 0.0–0.1)
Basophils Relative: 2 %
Eosinophils Absolute: 0.8 K/uL — ABNORMAL HIGH (ref 0.0–0.5)
Eosinophils Relative: 2 %
HCT: 29.3 % — ABNORMAL LOW (ref 36.0–46.0)
Hemoglobin: 9.5 g/dL — ABNORMAL LOW (ref 12.0–15.0)
Immature Granulocytes: 16 %
Lymphocytes Relative: 5 %
Lymphs Abs: 2.5 K/uL (ref 0.7–4.0)
MCH: 33.7 pg (ref 26.0–34.0)
MCHC: 32.4 g/dL (ref 30.0–36.0)
MCV: 103.9 fL — ABNORMAL HIGH (ref 80.0–100.0)
Monocytes Absolute: 13.2 K/uL — ABNORMAL HIGH (ref 0.1–1.0)
Monocytes Relative: 28 %
Neutro Abs: 22.1 K/uL — ABNORMAL HIGH (ref 1.7–7.7)
Neutrophils Relative %: 47 %
Platelet Count: 211 K/uL (ref 150–400)
RBC: 2.82 MIL/uL — ABNORMAL LOW (ref 3.87–5.11)
RDW: 17.4 % — ABNORMAL HIGH (ref 11.5–15.5)
WBC Count: 46.9 K/uL — ABNORMAL HIGH (ref 4.0–10.5)
nRBC: 0.1 % (ref 0.0–0.2)

## 2024-12-15 MED ORDER — SODIUM CHLORIDE 0.9 % IV SOLN
75.0000 mg/m2 | Freq: Once | INTRAVENOUS | Status: AC
  Administered 2024-12-15: 114 mg via INTRAVENOUS
  Filled 2024-12-15: qty 11.4

## 2024-12-15 MED ORDER — SODIUM CHLORIDE 0.9 % IV SOLN: Freq: Once | INTRAVENOUS | Status: AC

## 2024-12-15 MED ORDER — ONDANSETRON HCL 4 MG/2ML IJ SOLN
8.0000 mg | Freq: Once | INTRAMUSCULAR | Status: AC
  Administered 2024-12-15: 8 mg via INTRAVENOUS
  Filled 2024-12-15: qty 4

## 2024-12-16 ENCOUNTER — Inpatient Hospital Stay

## 2024-12-16 VITALS — BP 107/71 | HR 89 | Temp 98.7°F | Resp 20

## 2024-12-16 DIAGNOSIS — C9310 Chronic myelomonocytic leukemia not having achieved remission: Secondary | ICD-10-CM

## 2024-12-16 DIAGNOSIS — Z5111 Encounter for antineoplastic chemotherapy: Secondary | ICD-10-CM | POA: Diagnosis not present

## 2024-12-16 MED ORDER — SODIUM CHLORIDE 0.9 % IV SOLN
75.0000 mg/m2 | Freq: Once | INTRAVENOUS | Status: AC
  Administered 2024-12-16: 114 mg via INTRAVENOUS
  Filled 2024-12-16 (×2): qty 11.4

## 2024-12-16 MED ORDER — SODIUM CHLORIDE 0.9 % IV SOLN: Freq: Once | INTRAVENOUS | Status: AC

## 2024-12-16 MED ORDER — SODIUM CHLORIDE 0.9% FLUSH: 10.0000 mL | INTRAVENOUS | Status: DC | PRN

## 2024-12-16 MED ORDER — ONDANSETRON HCL 4 MG/2ML IJ SOLN
8.0000 mg | Freq: Once | INTRAMUSCULAR | Status: AC
  Administered 2024-12-16: 8 mg via INTRAVENOUS
  Filled 2024-12-16: qty 4

## 2024-12-17 ENCOUNTER — Inpatient Hospital Stay

## 2024-12-17 VITALS — BP 102/58 | HR 68 | Temp 97.7°F | Resp 18

## 2024-12-17 DIAGNOSIS — Z5111 Encounter for antineoplastic chemotherapy: Secondary | ICD-10-CM | POA: Diagnosis not present

## 2024-12-17 DIAGNOSIS — C9310 Chronic myelomonocytic leukemia not having achieved remission: Secondary | ICD-10-CM

## 2024-12-17 MED ORDER — SODIUM CHLORIDE 0.9 % IV SOLN: Freq: Once | INTRAVENOUS | Status: AC

## 2024-12-17 MED ORDER — SODIUM CHLORIDE 0.9% FLUSH: 10.0000 mL | INTRAVENOUS | Status: DC | PRN

## 2024-12-17 MED ORDER — SODIUM CHLORIDE 0.9 % IV SOLN
75.0000 mg/m2 | Freq: Once | INTRAVENOUS | Status: AC
  Administered 2024-12-17: 114 mg via INTRAVENOUS
  Filled 2024-12-17: qty 11.4

## 2024-12-17 MED ORDER — ONDANSETRON HCL 4 MG/2ML IJ SOLN
8.0000 mg | Freq: Once | INTRAMUSCULAR | Status: AC
  Administered 2024-12-17: 8 mg via INTRAVENOUS
  Filled 2024-12-17: qty 4

## 2024-12-17 NOTE — Patient Instructions (Signed)
 [facility] - A DEPT OF [facility]  Discharge Instructions: Thank you for choosing [facility] to provide your oncology and hematology care.   If you have a lab appointment with the Cancer Center, please go directly to the Cancer Center and check in at the registration area.   Wear comfortable clothing and clothing appropriate for easy access to any Portacath or PICC line.   We strive to give you quality time with your provider. You may need to reschedule your appointment if you arrive late (15 or more minutes).  Arriving late affects you and other patients whose appointments are after yours.  Also, if you miss three or more appointments without notifying the office, you may be dismissed from the clinic at the provider's discretion.      For prescription refill requests, have your pharmacy contact our office and allow 72 hours for refills to be completed.    Today you received the following chemotherapy and/or immunotherapy agents vidaza       To help prevent nausea and vomiting after your treatment, we encourage you to take your nausea medication as directed.  BELOW ARE SYMPTOMS THAT SHOULD BE REPORTED IMMEDIATELY: *FEVER GREATER THAN 100.4 F (38 C) OR HIGHER *CHILLS OR SWEATING *NAUSEA AND VOMITING THAT IS NOT CONTROLLED WITH YOUR NAUSEA MEDICATION *UNUSUAL SHORTNESS OF BREATH *UNUSUAL BRUISING OR BLEEDING *URINARY PROBLEMS (pain or burning when urinating, or frequent urination) *BOWEL PROBLEMS (unusual diarrhea, constipation, pain near the anus) TENDERNESS IN MOUTH AND THROAT WITH OR WITHOUT PRESENCE OF ULCERS (sore throat, sores in mouth, or a toothache) UNUSUAL RASH, SWELLING OR PAIN  UNUSUAL VAGINAL DISCHARGE OR ITCHING   Items with * indicate a potential emergency and should be followed up as soon as possible or go to the Emergency Department if any problems should occur.  Please show the CHEMOTHERAPY ALERT CARD or IMMUNOTHERAPY  ALERT CARD at check-in to the Emergency Department and triage nurse.  Should you have questions after your visit or need to cancel or reschedule your appointment, please contact [facility] - [facility]  Dept: [phone]  and follow the prompts.  Office hours are 8:00 a.m. to 4:30 p.m. Monday - Friday. Please note that voicemails left after 4:00 p.m. may not be returned until the following business day.  We are closed weekends and major holidays. You have access to a nurse at all times for urgent questions. Please call the main number to the clinic Dept: [phone] and follow the prompts.   For any non-urgent questions, you may also contact your provider using MyChart. We now offer e-Visits for anyone 14 and older to request care online for non-urgent symptoms. For details visit [email or web address].   Also download the MyChart app! Go to the app store, search MyChart, open the app, select [place], and log in with your MyChart username and password.

## 2024-12-19 ENCOUNTER — Inpatient Hospital Stay

## 2024-12-19 VITALS — BP 123/89 | HR 88 | Temp 97.8°F | Resp 16

## 2024-12-19 DIAGNOSIS — C9310 Chronic myelomonocytic leukemia not having achieved remission: Secondary | ICD-10-CM

## 2024-12-19 DIAGNOSIS — Z5111 Encounter for antineoplastic chemotherapy: Secondary | ICD-10-CM | POA: Diagnosis not present

## 2024-12-19 MED ORDER — ONDANSETRON HCL 4 MG/2ML IJ SOLN
8.0000 mg | Freq: Once | INTRAMUSCULAR | Status: AC
  Administered 2024-12-19: 8 mg via INTRAVENOUS
  Filled 2024-12-19: qty 4

## 2024-12-19 MED ORDER — SODIUM CHLORIDE 0.9 % IV SOLN
75.0000 mg/m2 | Freq: Once | INTRAVENOUS | Status: AC
  Administered 2024-12-19: 114 mg via INTRAVENOUS
  Filled 2024-12-19: qty 11.4

## 2024-12-19 MED ORDER — SODIUM CHLORIDE 0.9 % IV SOLN: Freq: Once | INTRAVENOUS | Status: AC

## 2024-12-19 NOTE — Patient Instructions (Signed)
 [facility] - A DEPT OF [facility]  Discharge Instructions: Thank you for choosing [facility] to provide your oncology and hematology care.   If you have a lab appointment with the Cancer Center, please go directly to the Cancer Center and check in at the registration area.   Wear comfortable clothing and clothing appropriate for easy access to any Portacath or PICC line.   We strive to give you quality time with your provider. You may need to reschedule your appointment if you arrive late (15 or more minutes).  Arriving late affects you and other patients whose appointments are after yours.  Also, if you miss three or more appointments without notifying the office, you may be dismissed from the clinic at the provider's discretion.      For prescription refill requests, have your pharmacy contact our office and allow 72 hours for refills to be completed.    Today you received the following chemotherapy and/or immunotherapy agents azacitidine      To help prevent nausea and vomiting after your treatment, we encourage you to take your nausea medication as directed.  BELOW ARE SYMPTOMS THAT SHOULD BE REPORTED IMMEDIATELY: *FEVER GREATER THAN 100.4 F (38 C) OR HIGHER *CHILLS OR SWEATING *NAUSEA AND VOMITING THAT IS NOT CONTROLLED WITH YOUR NAUSEA MEDICATION *UNUSUAL SHORTNESS OF BREATH *UNUSUAL BRUISING OR BLEEDING *URINARY PROBLEMS (pain or burning when urinating, or frequent urination) *BOWEL PROBLEMS (unusual diarrhea, constipation, pain near the anus) TENDERNESS IN MOUTH AND THROAT WITH OR WITHOUT PRESENCE OF ULCERS (sore throat, sores in mouth, or a toothache) UNUSUAL RASH, SWELLING OR PAIN  UNUSUAL VAGINAL DISCHARGE OR ITCHING   Items with * indicate a potential emergency and should be followed up as soon as possible or go to the Emergency Department if any problems should occur.  Please show the CHEMOTHERAPY ALERT CARD or IMMUNOTHERAPY  ALERT CARD at check-in to the Emergency Department and triage nurse.  Should you have questions after your visit or need to cancel or reschedule your appointment, please contact [facility] - A DEPT OF [facility]  Dept: [phone]  and follow the prompts.  Office hours are 8:00 a.m. to 4:30 p.m. Monday - Friday. Please note that voicemails left after 4:00 p.m. may not be returned until the following business day.  We are closed weekends and major holidays. You have access to a nurse at all times for urgent questions. Please call the main number to the clinic Dept: [phone] and follow the prompts.   For any non-urgent questions, you may also contact your provider using MyChart. We now offer e-Visits for anyone 48 and older to request care online for non-urgent symptoms. For details visit [email or web address].   Also download the MyChart app! Go to the app store, search "MyChart", open the app, select [place], and log in with your MyChart username and password.

## 2024-12-22 ENCOUNTER — Inpatient Hospital Stay

## 2024-12-22 VITALS — BP 104/69 | HR 79 | Temp 97.3°F | Resp 16 | Wt 105.5 lb

## 2024-12-22 DIAGNOSIS — Z5111 Encounter for antineoplastic chemotherapy: Secondary | ICD-10-CM | POA: Diagnosis not present

## 2024-12-22 DIAGNOSIS — C9310 Chronic myelomonocytic leukemia not having achieved remission: Secondary | ICD-10-CM

## 2024-12-22 MED ORDER — SODIUM CHLORIDE 0.9 % IV SOLN: Freq: Once | INTRAVENOUS | Status: AC

## 2024-12-22 MED ORDER — AZACITIDINE CHEMO INJECTION 100 MG
75.0000 mg/m2 | Freq: Once | INTRAMUSCULAR | Status: AC
  Administered 2024-12-22: 114 mg via INTRAVENOUS
  Filled 2024-12-22: qty 11.4

## 2024-12-22 MED ORDER — ONDANSETRON HCL 4 MG/2ML IJ SOLN
8.0000 mg | Freq: Once | INTRAMUSCULAR | Status: AC
  Administered 2024-12-22: 8 mg via INTRAVENOUS
  Filled 2024-12-22: qty 4

## 2024-12-22 NOTE — Patient Instructions (Signed)
 [facility]   Discharge Instructions: Thank you for choosing [facility] to provide your oncology and hematology care.   If you have a lab appointment with the Cancer Center, please go directly to the Cancer Center and check in at the registration area.   Wear comfortable clothing and clothing appropriate for easy access to any Portacath or PICC line.   We strive to give you quality time with your provider. You may need to reschedule your appointment if you arrive late (15 or more minutes).  Arriving late affects you and other patients whose appointments are after yours.  Also, if you miss three or more appointments without notifying the office, you may be dismissed from the clinic at the provider's discretion.      For prescription refill requests, have your pharmacy contact our office and allow 72 hours for refills to be completed.    Today you received the following chemotherapy and/or immunotherapy agents: Azacitidine (Vidaza)      To help prevent nausea and vomiting after your treatment, we encourage you to take your nausea medication as directed.  BELOW ARE SYMPTOMS THAT SHOULD BE REPORTED IMMEDIATELY: *FEVER GREATER THAN 100.4 F (38 C) OR HIGHER *CHILLS OR SWEATING *NAUSEA AND VOMITING THAT IS NOT CONTROLLED WITH YOUR NAUSEA MEDICATION *UNUSUAL SHORTNESS OF BREATH *UNUSUAL BRUISING OR BLEEDING *URINARY PROBLEMS (pain or burning when urinating, or frequent urination) *BOWEL PROBLEMS (unusual diarrhea, constipation, pain near the anus) TENDERNESS IN MOUTH AND THROAT WITH OR WITHOUT PRESENCE OF ULCERS (sore throat, sores in mouth, or a toothache) UNUSUAL RASH, SWELLING OR PAIN  UNUSUAL VAGINAL DISCHARGE OR ITCHING   Items with * indicate a potential emergency and should be followed up as soon as possible or go to the Emergency Department if any problems should occur.  Please show the CHEMOTHERAPY ALERT CARD or  IMMUNOTHERAPY ALERT CARD at check-in to the Emergency Department and triage nurse.  Should you have questions after your visit or need to cancel or reschedule your appointment, please contact [facility] - A DEPT OF [facility]  Dept: [phone]  and follow the prompts.  Office hours are 8:00 a.m. to 4:30 p.m. Monday - Friday. Please note that voicemails left after 4:00 p.m. may not be returned until the following business day.  We are closed weekends and major holidays. You have access to a nurse at all times for urgent questions. Please call the main number to the clinic Dept: [phone] and follow the prompts.   For any non-urgent questions, you may also contact your provider using MyChart. We now offer e-Visits for anyone 1 and older to request care online for non-urgent symptoms. For details visit [email or web address].   Also download the MyChart app! Go to the app store, search "MyChart", open the app, select [place], and log in with your MyChart username and password.

## 2024-12-23 ENCOUNTER — Encounter: Payer: Self-pay | Admitting: Rheumatology

## 2025-01-05 ENCOUNTER — Encounter: Payer: Self-pay | Admitting: Cardiology

## 2025-01-05 ENCOUNTER — Telehealth: Payer: Self-pay

## 2025-01-05 MED ORDER — APIXABAN 5 MG PO TABS: ORAL_TABLET | ORAL | 5 refills | Status: DC

## 2025-01-06 MED ORDER — APIXABAN 5 MG PO TABS: ORAL_TABLET | ORAL | 5 refills | Status: DC

## 2025-01-06 NOTE — Telephone Encounter (Signed)
 Prescription refill request for Eliquis  received. Indication: AFIB  Last office visit: 09/22/2024 Scr:  1.48 (12/15/24) Age: 79 Weight: 47.9 KG  Eliquis  refill sent.

## 2025-01-06 NOTE — Telephone Encounter (Signed)
 Refill was done via mychart.

## 2025-01-06 NOTE — Telephone Encounter (Signed)
" °*  STAT* If patient is at the pharmacy, call can be transferred to refill team.   1. Which medications need to be refilled? (please list name of each medication and dose if known)   apixaban  (ELIQUIS ) 5 MG TABS tablet   2. Would you like to learn more about the convenience, safety, & potential cost savings by using the [facility]?   3. Are you open to using the [facility] (Type [facility]. ).  4. Which pharmacy/location (including street and city if local pharmacy) is medication to be sent to?  [id number] - [place], [place] - [address] AT [facility] OF [address]   5. Do they need a 30 day or 90 day supply?   90 day  Husband [facility]) stated patient is completely out of this medication.   Patient has appointment scheduled with Dr. Michele on 3/30. "

## 2025-01-10 ENCOUNTER — Encounter: Payer: Self-pay | Admitting: Hematology

## 2025-01-12 ENCOUNTER — Emergency Department (HOSPITAL_COMMUNITY)

## 2025-01-12 ENCOUNTER — Other Ambulatory Visit: Payer: Self-pay

## 2025-01-12 ENCOUNTER — Encounter (HOSPITAL_COMMUNITY): Payer: Self-pay | Admitting: General Surgery

## 2025-01-12 ENCOUNTER — Inpatient Hospital Stay (HOSPITAL_COMMUNITY)
Admission: EM | Admit: 2025-01-12 | Discharge: 2025-01-25 | DRG: 871 | Disposition: E | Attending: Emergency Medicine | Admitting: Emergency Medicine

## 2025-01-12 DIAGNOSIS — E872 Acidosis, unspecified: Secondary | ICD-10-CM | POA: Diagnosis present

## 2025-01-12 DIAGNOSIS — I130 Hypertensive heart and chronic kidney disease with heart failure and stage 1 through stage 4 chronic kidney disease, or unspecified chronic kidney disease: Secondary | ICD-10-CM | POA: Diagnosis present

## 2025-01-12 DIAGNOSIS — G9341 Metabolic encephalopathy: Secondary | ICD-10-CM | POA: Insufficient documentation

## 2025-01-12 DIAGNOSIS — Z880 Allergy status to penicillin: Secondary | ICD-10-CM

## 2025-01-12 DIAGNOSIS — Z7901 Long term (current) use of anticoagulants: Secondary | ICD-10-CM

## 2025-01-12 DIAGNOSIS — N2581 Secondary hyperparathyroidism of renal origin: Secondary | ICD-10-CM | POA: Diagnosis present

## 2025-01-12 DIAGNOSIS — K2100 Gastro-esophageal reflux disease with esophagitis, without bleeding: Secondary | ICD-10-CM | POA: Diagnosis present

## 2025-01-12 DIAGNOSIS — D696 Thrombocytopenia, unspecified: Secondary | ICD-10-CM | POA: Diagnosis present

## 2025-01-12 DIAGNOSIS — I4892 Unspecified atrial flutter: Secondary | ICD-10-CM | POA: Diagnosis present

## 2025-01-12 DIAGNOSIS — N179 Acute kidney failure, unspecified: Secondary | ICD-10-CM | POA: Insufficient documentation

## 2025-01-12 DIAGNOSIS — M069 Rheumatoid arthritis, unspecified: Secondary | ICD-10-CM | POA: Diagnosis present

## 2025-01-12 DIAGNOSIS — Z681 Body mass index (BMI) 19 or less, adult: Secondary | ICD-10-CM

## 2025-01-12 DIAGNOSIS — F32A Depression, unspecified: Secondary | ICD-10-CM | POA: Diagnosis present

## 2025-01-12 DIAGNOSIS — D6481 Anemia due to antineoplastic chemotherapy: Secondary | ICD-10-CM | POA: Diagnosis present

## 2025-01-12 DIAGNOSIS — J690 Pneumonitis due to inhalation of food and vomit: Secondary | ICD-10-CM | POA: Diagnosis present

## 2025-01-12 DIAGNOSIS — Z883 Allergy status to other anti-infective agents status: Secondary | ICD-10-CM

## 2025-01-12 DIAGNOSIS — E785 Hyperlipidemia, unspecified: Secondary | ICD-10-CM | POA: Diagnosis present

## 2025-01-12 DIAGNOSIS — S80821A Blister (nonthermal), right lower leg, initial encounter: Secondary | ICD-10-CM | POA: Diagnosis present

## 2025-01-12 DIAGNOSIS — L97829 Non-pressure chronic ulcer of other part of left lower leg with unspecified severity: Secondary | ICD-10-CM | POA: Diagnosis present

## 2025-01-12 DIAGNOSIS — R270 Ataxia, unspecified: Secondary | ICD-10-CM | POA: Diagnosis not present

## 2025-01-12 DIAGNOSIS — Z833 Family history of diabetes mellitus: Secondary | ICD-10-CM

## 2025-01-12 DIAGNOSIS — R64 Cachexia: Secondary | ICD-10-CM | POA: Diagnosis present

## 2025-01-12 DIAGNOSIS — E1151 Type 2 diabetes mellitus with diabetic peripheral angiopathy without gangrene: Secondary | ICD-10-CM | POA: Diagnosis present

## 2025-01-12 DIAGNOSIS — K3580 Unspecified acute appendicitis: Secondary | ICD-10-CM | POA: Diagnosis not present

## 2025-01-12 DIAGNOSIS — T451X5A Adverse effect of antineoplastic and immunosuppressive drugs, initial encounter: Secondary | ICD-10-CM | POA: Diagnosis present

## 2025-01-12 DIAGNOSIS — R54 Age-related physical debility: Secondary | ICD-10-CM | POA: Diagnosis present

## 2025-01-12 DIAGNOSIS — Z79620 Long term (current) use of immunosuppressive biologic: Secondary | ICD-10-CM

## 2025-01-12 DIAGNOSIS — I5043 Acute on chronic combined systolic (congestive) and diastolic (congestive) heart failure: Secondary | ICD-10-CM | POA: Diagnosis present

## 2025-01-12 DIAGNOSIS — A419 Sepsis, unspecified organism: Principal | ICD-10-CM | POA: Diagnosis present

## 2025-01-12 DIAGNOSIS — Z515 Encounter for palliative care: Secondary | ICD-10-CM

## 2025-01-12 DIAGNOSIS — J90 Pleural effusion, not elsewhere classified: Secondary | ICD-10-CM | POA: Diagnosis present

## 2025-01-12 DIAGNOSIS — F1010 Alcohol abuse, uncomplicated: Secondary | ICD-10-CM | POA: Diagnosis present

## 2025-01-12 DIAGNOSIS — G8929 Other chronic pain: Secondary | ICD-10-CM | POA: Diagnosis present

## 2025-01-12 DIAGNOSIS — L97429 Non-pressure chronic ulcer of left heel and midfoot with unspecified severity: Secondary | ICD-10-CM | POA: Diagnosis present

## 2025-01-12 DIAGNOSIS — K921 Melena: Secondary | ICD-10-CM | POA: Diagnosis present

## 2025-01-12 DIAGNOSIS — Z96611 Presence of right artificial shoulder joint: Secondary | ICD-10-CM | POA: Diagnosis present

## 2025-01-12 DIAGNOSIS — R188 Other ascites: Secondary | ICD-10-CM | POA: Diagnosis present

## 2025-01-12 DIAGNOSIS — K5710 Diverticulosis of small intestine without perforation or abscess without bleeding: Secondary | ICD-10-CM | POA: Diagnosis present

## 2025-01-12 DIAGNOSIS — Z66 Do not resuscitate: Secondary | ICD-10-CM | POA: Diagnosis present

## 2025-01-12 DIAGNOSIS — Z87891 Personal history of nicotine dependence: Secondary | ICD-10-CM

## 2025-01-12 DIAGNOSIS — I480 Paroxysmal atrial fibrillation: Secondary | ICD-10-CM

## 2025-01-12 DIAGNOSIS — N1831 Chronic kidney disease, stage 3a: Secondary | ICD-10-CM | POA: Diagnosis present

## 2025-01-12 DIAGNOSIS — I4819 Other persistent atrial fibrillation: Secondary | ICD-10-CM | POA: Diagnosis present

## 2025-01-12 DIAGNOSIS — E876 Hypokalemia: Secondary | ICD-10-CM | POA: Diagnosis present

## 2025-01-12 DIAGNOSIS — C9310 Chronic myelomonocytic leukemia not having achieved remission: Secondary | ICD-10-CM | POA: Diagnosis present

## 2025-01-12 DIAGNOSIS — L89321 Pressure ulcer of left buttock, stage 1: Secondary | ICD-10-CM | POA: Diagnosis present

## 2025-01-12 DIAGNOSIS — J449 Chronic obstructive pulmonary disease, unspecified: Secondary | ICD-10-CM | POA: Diagnosis present

## 2025-01-12 DIAGNOSIS — I5023 Acute on chronic systolic (congestive) heart failure: Secondary | ICD-10-CM | POA: Diagnosis present

## 2025-01-12 DIAGNOSIS — Z7952 Long term (current) use of systemic steroids: Secondary | ICD-10-CM

## 2025-01-12 DIAGNOSIS — K3533 Acute appendicitis with perforation and localized peritonitis, with abscess: Secondary | ICD-10-CM | POA: Diagnosis present

## 2025-01-12 DIAGNOSIS — I3139 Other pericardial effusion (noninflammatory): Secondary | ICD-10-CM | POA: Diagnosis present

## 2025-01-12 DIAGNOSIS — T502X5A Adverse effect of carbonic-anhydrase inhibitors, benzothiadiazides and other diuretics, initial encounter: Secondary | ICD-10-CM | POA: Diagnosis not present

## 2025-01-12 DIAGNOSIS — L8990 Pressure ulcer of unspecified site, unspecified stage: Secondary | ICD-10-CM | POA: Insufficient documentation

## 2025-01-12 DIAGNOSIS — J440 Chronic obstructive pulmonary disease with acute lower respiratory infection: Secondary | ICD-10-CM | POA: Diagnosis present

## 2025-01-12 DIAGNOSIS — I4891 Unspecified atrial fibrillation: Secondary | ICD-10-CM | POA: Diagnosis present

## 2025-01-12 DIAGNOSIS — E440 Moderate protein-calorie malnutrition: Secondary | ICD-10-CM | POA: Diagnosis present

## 2025-01-12 DIAGNOSIS — K37 Unspecified appendicitis: Secondary | ICD-10-CM | POA: Diagnosis present

## 2025-01-12 DIAGNOSIS — E11621 Type 2 diabetes mellitus with foot ulcer: Secondary | ICD-10-CM | POA: Diagnosis present

## 2025-01-12 DIAGNOSIS — Z79899 Other long term (current) drug therapy: Secondary | ICD-10-CM

## 2025-01-12 DIAGNOSIS — R1031 Right lower quadrant pain: Principal | ICD-10-CM

## 2025-01-12 DIAGNOSIS — R6520 Severe sepsis without septic shock: Secondary | ICD-10-CM | POA: Diagnosis present

## 2025-01-12 DIAGNOSIS — K8070 Calculus of gallbladder and bile duct without cholecystitis without obstruction: Secondary | ICD-10-CM | POA: Diagnosis present

## 2025-01-12 DIAGNOSIS — E8809 Other disorders of plasma-protein metabolism, not elsewhere classified: Secondary | ICD-10-CM | POA: Diagnosis present

## 2025-01-12 DIAGNOSIS — Z8249 Family history of ischemic heart disease and other diseases of the circulatory system: Secondary | ICD-10-CM

## 2025-01-12 DIAGNOSIS — Z7189 Other specified counseling: Secondary | ICD-10-CM

## 2025-01-12 DIAGNOSIS — E1122 Type 2 diabetes mellitus with diabetic chronic kidney disease: Secondary | ICD-10-CM | POA: Diagnosis present

## 2025-01-12 DIAGNOSIS — J189 Pneumonia, unspecified organism: Secondary | ICD-10-CM | POA: Diagnosis present

## 2025-01-12 DIAGNOSIS — X58XXXA Exposure to other specified factors, initial encounter: Secondary | ICD-10-CM | POA: Diagnosis present

## 2025-01-12 DIAGNOSIS — I429 Cardiomyopathy, unspecified: Secondary | ICD-10-CM | POA: Diagnosis present

## 2025-01-12 DIAGNOSIS — T426X5A Adverse effect of other antiepileptic and sedative-hypnotic drugs, initial encounter: Secondary | ICD-10-CM | POA: Diagnosis not present

## 2025-01-12 LAB — COMPREHENSIVE METABOLIC PANEL WITH GFR
ALT: 12 U/L (ref 0–44)
AST: 26 U/L (ref 15–41)
Albumin: 3.5 g/dL (ref 3.5–5.0)
Alkaline Phosphatase: 105 U/L (ref 38–126)
Anion gap: 13 (ref 5–15)
BUN: 21 mg/dL (ref 8–23)
CO2: 23 mmol/L (ref 22–32)
Calcium: 8.1 mg/dL — ABNORMAL LOW (ref 8.9–10.3)
Chloride: 97 mmol/L — ABNORMAL LOW (ref 98–111)
Creatinine, Ser: 1.07 mg/dL — ABNORMAL HIGH (ref 0.44–1.00)
GFR, Estimated: 53 mL/min — ABNORMAL LOW
Glucose, Bld: 124 mg/dL — ABNORMAL HIGH (ref 70–99)
Potassium: 3.9 mmol/L (ref 3.5–5.1)
Sodium: 133 mmol/L — ABNORMAL LOW (ref 135–145)
Total Bilirubin: 0.6 mg/dL (ref 0.0–1.2)
Total Protein: 6.3 g/dL — ABNORMAL LOW (ref 6.5–8.1)

## 2025-01-12 LAB — TYPE AND SCREEN
ABO/RH(D): B POS
Antibody Screen: NEGATIVE

## 2025-01-12 LAB — URINALYSIS, ROUTINE W REFLEX MICROSCOPIC
Bilirubin Urine: NEGATIVE
Glucose, UA: NEGATIVE mg/dL
Ketones, ur: NEGATIVE mg/dL
Leukocytes,Ua: NEGATIVE
Nitrite: NEGATIVE
Protein, ur: NEGATIVE mg/dL
Specific Gravity, Urine: 1.029 (ref 1.005–1.030)
pH: 6 (ref 5.0–8.0)

## 2025-01-12 LAB — CBC
HCT: 32.5 % — ABNORMAL LOW (ref 36.0–46.0)
Hemoglobin: 10.4 g/dL — ABNORMAL LOW (ref 12.0–15.0)
MCH: 33.5 pg (ref 26.0–34.0)
MCHC: 32 g/dL (ref 30.0–36.0)
MCV: 104.8 fL — ABNORMAL HIGH (ref 80.0–100.0)
Platelets: 172 K/uL (ref 150–400)
RBC: 3.1 MIL/uL — ABNORMAL LOW (ref 3.87–5.11)
RDW: 17.2 % — ABNORMAL HIGH (ref 11.5–15.5)
WBC: 84.9 K/uL (ref 4.0–10.5)
nRBC: 0.1 % (ref 0.0–0.2)

## 2025-01-12 LAB — PROTIME-INR
INR: 1.7 — ABNORMAL HIGH (ref 0.8–1.2)
Prothrombin Time: 20.6 s — ABNORMAL HIGH (ref 11.4–15.2)

## 2025-01-12 LAB — POC OCCULT BLOOD, ED: Fecal Occult Bld: POSITIVE — AB

## 2025-01-12 LAB — LIPASE, BLOOD: Lipase: 26 U/L (ref 11–51)

## 2025-01-12 LAB — APTT: aPTT: 33 s (ref 24–36)

## 2025-01-12 MED ORDER — SODIUM CHLORIDE 0.9 % IV SOLN
2.0000 g | Freq: Once | INTRAVENOUS | Status: AC
  Administered 2025-01-12: 2 g via INTRAVENOUS
  Filled 2025-01-12: qty 12.5

## 2025-01-12 MED ORDER — ARFORMOTEROL TARTRATE 15 MCG/2ML IN NEBU
15.0000 ug | INHALATION_SOLUTION | Freq: Two times a day (BID) | RESPIRATORY_TRACT | Status: DC
  Administered 2025-01-14 – 2025-01-20 (×9): 15 ug via RESPIRATORY_TRACT
  Filled 2025-01-12 (×14): qty 2

## 2025-01-12 MED ORDER — SODIUM CHLORIDE 0.9 % IV SOLN
2.0000 g | Freq: Three times a day (TID) | INTRAVENOUS | Status: DC
  Administered 2025-01-12 – 2025-01-13 (×3): 2 g via INTRAVENOUS
  Filled 2025-01-12 (×3): qty 12.5

## 2025-01-12 MED ORDER — UMECLIDINIUM BROMIDE 62.5 MCG/ACT IN AEPB
1.0000 | INHALATION_SPRAY | Freq: Every day | RESPIRATORY_TRACT | Status: DC
  Administered 2025-01-14 – 2025-01-17 (×4): 1 via RESPIRATORY_TRACT
  Filled 2025-01-12 (×2): qty 7

## 2025-01-12 MED ORDER — DILTIAZEM HCL 25 MG/5ML IV SOLN
10.0000 mg | Freq: Once | INTRAVENOUS | Status: AC
  Administered 2025-01-12: 10 mg via INTRAVENOUS
  Filled 2025-01-12: qty 5

## 2025-01-12 MED ORDER — METRONIDAZOLE 500 MG/100ML IV SOLN
500.0000 mg | Freq: Two times a day (BID) | INTRAVENOUS | Status: DC
  Administered 2025-01-13 – 2025-01-18 (×12): 500 mg via INTRAVENOUS
  Filled 2025-01-12 (×12): qty 100

## 2025-01-12 MED ORDER — PANTOPRAZOLE SODIUM 40 MG IV SOLR
40.0000 mg | INTRAVENOUS | Status: DC
  Administered 2025-01-13 – 2025-01-15 (×3): 40 mg via INTRAVENOUS
  Filled 2025-01-12 (×3): qty 10

## 2025-01-12 MED ORDER — LACTATED RINGERS IV SOLN: INTRAVENOUS | Status: AC

## 2025-01-12 MED ORDER — ACETAMINOPHEN 325 MG PO TABS
650.0000 mg | ORAL_TABLET | Freq: Four times a day (QID) | ORAL | Status: DC | PRN
  Administered 2025-01-15 – 2025-01-16 (×3): 650 mg via ORAL
  Filled 2025-01-12 (×3): qty 2

## 2025-01-12 MED ORDER — SODIUM CHLORIDE 0.9 % IV BOLUS
1000.0000 mL | Freq: Once | INTRAVENOUS | Status: AC
  Administered 2025-01-12: 1000 mL via INTRAVENOUS

## 2025-01-12 MED ORDER — IOHEXOL 300 MG/ML  SOLN
100.0000 mL | Freq: Once | INTRAMUSCULAR | Status: AC | PRN
  Administered 2025-01-12: 100 mL via INTRAVENOUS

## 2025-01-12 MED ORDER — GABAPENTIN 300 MG PO CAPS
300.0000 mg | ORAL_CAPSULE | Freq: Every day | ORAL | Status: DC
  Administered 2025-01-12 – 2025-01-19 (×7): 300 mg via ORAL
  Filled 2025-01-12 (×9): qty 1

## 2025-01-12 MED ORDER — PANTOPRAZOLE SODIUM 40 MG IV SOLR
40.0000 mg | Freq: Once | INTRAVENOUS | Status: AC
  Administered 2025-01-12: 40 mg via INTRAVENOUS
  Filled 2025-01-12: qty 10

## 2025-01-12 MED ORDER — ONDANSETRON HCL 4 MG/2ML IJ SOLN
4.0000 mg | Freq: Once | INTRAMUSCULAR | Status: AC
  Administered 2025-01-12: 4 mg via INTRAVENOUS
  Filled 2025-01-12: qty 2

## 2025-01-12 MED ORDER — ONDANSETRON HCL 4 MG/2ML IJ SOLN
4.0000 mg | Freq: Four times a day (QID) | INTRAMUSCULAR | Status: DC | PRN
  Administered 2025-01-19 – 2025-01-20 (×2): 4 mg via INTRAVENOUS
  Filled 2025-01-12 (×2): qty 2

## 2025-01-12 MED ORDER — METRONIDAZOLE 500 MG/100ML IV SOLN
500.0000 mg | Freq: Once | INTRAVENOUS | Status: AC
  Administered 2025-01-12: 500 mg via INTRAVENOUS
  Filled 2025-01-12: qty 100

## 2025-01-12 MED ORDER — METOPROLOL TARTRATE 50 MG PO TABS
150.0000 mg | ORAL_TABLET | Freq: Two times a day (BID) | ORAL | Status: DC
  Administered 2025-01-12: 150 mg via ORAL
  Filled 2025-01-12 (×3): qty 6
  Filled 2025-01-12 (×2): qty 3

## 2025-01-12 MED ORDER — MORPHINE SULFATE (PF) 4 MG/ML IV SOLN
4.0000 mg | INTRAVENOUS | Status: DC | PRN
  Administered 2025-01-12 – 2025-01-15 (×2): 4 mg via INTRAVENOUS
  Filled 2025-01-12 (×2): qty 1

## 2025-01-12 MED ORDER — ONDANSETRON HCL 4 MG PO TABS: 4.0000 mg | ORAL_TABLET | Freq: Four times a day (QID) | ORAL | Status: DC | PRN

## 2025-01-12 MED ORDER — MORPHINE SULFATE (PF) 4 MG/ML IV SOLN
4.0000 mg | Freq: Once | INTRAVENOUS | Status: AC
  Administered 2025-01-12: 4 mg via INTRAVENOUS
  Filled 2025-01-12: qty 1

## 2025-01-12 MED ORDER — ACETAMINOPHEN 650 MG RE SUPP: 650.0000 mg | Freq: Four times a day (QID) | RECTAL | Status: DC | PRN

## 2025-01-12 MED ORDER — DILTIAZEM HCL ER COATED BEADS 120 MG PO CP24: 360.0000 mg | ORAL_CAPSULE | Freq: Every day | ORAL | Status: DC

## 2025-01-12 NOTE — ED Provider Notes (Signed)
 " [place] [facility] DE[facility]MENT AT [facility] Provider Note   CSN: [id number] Arrival date & time: 01/12/25  1008     Patient presents with: Emesis   [name] is a 79 y.o. 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  She also endorses dark stool.  Vomiting and nausea ongoing for around 3 days.    Emesis      Prior to Admission medications  Medication Sig Start Date End Date Taking? Authorizing Provider  acetaminophen  (TYLENOL ) 500 MG tablet Take 2 tablets (1,000 mg total) by mouth 3 (three) times daily. 11/14/23   Samtani, Jai-Gurmukh, MD  apixaban  (ELIQUIS ) 5 MG TABS tablet TAKE 1 TABLET(5 MG) BY MOUTH TWICE DAILY 01/06/25   Tolia, Sunit, DO  azaCITIDine  (VIDAZA ) 100 MG SUSR 114mg  Injection every 6 weeks 03/29/23   [provider]  denosumab  (PROLIA ) 60 MG/ML SOSY injection Inject 60 mg into the skin every 6 (six) months.    [provider]  diltiazem  (CARDIZEM  CD) 360 MG 24 hr capsule Take 1 capsule (360 mg total) by mouth daily. 07/21/24   Swinyer, Rosaline HERO, NP  furosemide  (LASIX ) 20 MG tablet Take 1 tablet (20 mg total) by mouth daily. 05/21/22 12/15/24  Antoinette Doe, MD  gabapentin  (NEURONTIN ) 300 MG capsule Take 300 mg by mouth in the morning. 07/21/14   [provider]  inFLIXimab  (REMICADE ) 100 MG injection Inject into the vein See admin instructions.    [provider]  loratadine  (CLARITIN ) 10 MG tablet Take 10 mg by mouth daily.    [provider]  Metoprolol  Tartrate 75 MG TABS Take 2 tablets (150 mg total) by mouth 2 (two) times daily. 11/19/24   Tolia, Sunit, DO  nitrofurantoin , macrocrystal-monohydrate, (MACROBID ) 100 MG capsule Take 1 capsule (100 mg total) by mouth 2 (two) times daily. 11/25/24   Boscia, Heather E, NP  ondansetron  (ZOFRAN ) 4 MG tablet  Take 1 tablet (4 mg total) by mouth every 8 (eight) hours as needed for nausea or vomiting. 09/23/24   [name], MD  predniSONE  (DELTASONE ) 5 MG tablet Take 5 mg by mouth daily with breakfast. 09/22/21   [provider]  Tiotropium Bromide-Olodaterol (STIOLTO RESPIMAT ) 2.5-2.5 MCG/ACT AERS Inhale 2 puffs into the lungs daily. 05/21/24   Cobb, Comer GAILS, NP    Allergies: Digoxin  and related, Gluten meal, Penicillins, Fexofenadine, Alendronate, and Hydroxychloroquine    Review of Systems  Gastrointestinal:  Positive for vomiting.  All other systems reviewed and are negative.   Updated Vital Signs BP (!) 129/108   Pulse 88   Temp 97.9 F (36.6 C) (Oral)   Resp 18   SpO2 94%   Physical Exam Vitals and nursing note reviewed.  Constitutional:      General: She is not in acute distress.    Appearance: Normal appearance.  HENT:     Head: Normocephalic and atraumatic.  Eyes:     General:        Right eye: No discharge.        Left eye: No discharge.  Cardiovascular:     Rate and Rhythm: Normal rate and regular rhythm.     Heart sounds: No murmur heard.    No friction rub. No gallop.  Pulmonary:     Effort: Pulmonary effort is normal.     Breath sounds: Normal breath sounds.  Abdominal:  General: Bowel sounds are normal.     Palpations: Abdomen is soft.     Comments: Significant focal tenderness to palpation in the lower abdomen, right lower quadrant.  Genitourinary:    Comments: Gross melena on exam Skin:    General: Skin is warm and dry.     Capillary Refill: Capillary refill takes less than 2 seconds.  Neurological:     Mental Status: She is alert and oriented to person, place, and time.  Psychiatric:        Mood and Affect: Mood normal.        Behavior: Behavior normal.     (all labs ordered are listed, but only abnormal results are displayed) Labs Reviewed  COMPREHENSIVE METABOLIC PANEL WITH GFR - Abnormal; Notable for the following components:       Result Value   Sodium 133 (*)    Chloride 97 (*)    Glucose, Bld 124 (*)    Creatinine, Ser 1.07 (*)    Calcium  8.1 (*)    Total Protein 6.3 (*)    GFR, Estimated 53 (*)    All other components within normal limits  CBC - Abnormal; Notable for the following components:   WBC 84.9 (*)    RBC 3.10 (*)    Hemoglobin 10.4 (*)    HCT 32.5 (*)    MCV 104.8 (*)    RDW 17.2 (*)    All other components within normal limits  URINALYSIS, ROUTINE W REFLEX MICROSCOPIC - Abnormal; Notable for the following components:   Hgb urine dipstick SMALL (*)    Bacteria, UA RARE (*)    All other components within normal limits  PROTIME-INR - Abnormal; Notable for the following components:   Prothrombin Time 20.6 (*)    INR 1.7 (*)    All other components within normal limits  POC OCCULT BLOOD, ED - Abnormal; Notable for the following components:   Fecal Occult Bld POSITIVE (*)    All other components within normal limits  LIPASE, BLOOD  APTT  TYPE AND SCREEN    EKG: None  Radiology: CT ABDOMEN PELVIS W CONTRAST Result Date: 01/12/2025 EXAM: CT ABDOMEN AND PELVIS WITH CONTRAST 01/12/2025 12:46:02 PM TECHNIQUE: CT of the abdomen and pelvis was performed with the administration of 100 mL of iohexol  (OMNIPAQUE ) 300 MG/ML solution. Multiplanar reformatted images are provided for review. Automated exposure control, iterative reconstruction, and/or weight-based adjustment of the mA/kV was utilized to reduce the radiation dose to as low as reasonably achievable. COMPARISON: 11/12/2023 CLINICAL HISTORY: Nausea, vomiting, and weakness for 3 days. History of chronic myelomonocytic leukemia. * Tracking Code: BO * FINDINGS: LOWER CHEST: Stable appearance of lower thoracic paraspinal and bilateral posterior pleural based masses, the posterior pleural based mass is a short axis diameter of 1.3 cm, previously 1.2 cm. Small bilateral pleural effusions with passive atelectasis. Cardiomegaly noted with mitral valve  calcifications. Emphysema. LIVER: Scattered hypodense hepatic lesions favoring cysts, the largest is in the lateral segment left hepatic lobe with fluid density and measuring 1.5 cm in long axis. GALLBLADDER AND BILE DUCTS: The dependent density in the gallbladder suspicious for small gallstone, image 28 series 2. 5 mm linear calcification posteriorly in the distal common bile duct suspicious for choledocholithiasis, image 33 series 2 and image 80 series 10. The common bile duct measures 6 mm in diameter which is within normal limits and similar to prior. No intrahepatic biliary dilatation. SPLEEN: No acute abnormality. PANCREAS: No acute abnormality. ADRENAL GLANDS: No acute abnormality.  KIDNEYS, URETERS AND BLADDER: In addition to bilateral Bosniak category 1 renal cysts there are multiple hypodense renal lesions which are likely cysts but are technically too small to characterize. These lesions do not require further imaging workup. No stones in the kidneys or ureters. No hydronephrosis. No perinephric or periureteral stranding. Urinary bladder is unremarkable. GI AND BOWEL: Abnormal pericecal collection in the expected vicinity of the appendix measuring 4.8 x 2.7 x 2.6 cm, not readily separable from the appendix, suspicious for appendicitis possibly with contained periappendiceal abscess. Appendiceal mass is a less likely differential diagnostic consideration given the unremarkable appearance of the appendix on 11/12/2023. Correlate with the patient's symptoms. There is a mild stranding around this lesion indicating inflammation. Sigmoid colon diverticulosis. Scattered diverticula elsewhere in the descending colon. Stomach demonstrates no acute abnormality. There is no bowel obstruction. PERITONEUM AND RETROPERITONEUM: No ascites. No free air. VASCULATURE: Systemic atherosclerosis is present, including the aorta and iliac arteries. Aorta is normal in caliber. LYMPH NODES: No lymphadenopathy. REPRODUCTIVE ORGANS:  No acute abnormality. 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 Sclerosis in the sacrum likely attributable to remote sacral insufficiency fracture. Extra convex lower thoracic and upper lumbar scoliosis. Right foraminal impingement of L4-L5 and L5-S1 primarily from spondylosis. IMPRESSION: 1. Abnormal pericecal collection inseparable from the appendix, suspicious for acute appendicitis with contained periappendiceal abscess; surgical consultation is recommended. 2. Linear calcification in the distal common bile duct suspicious for choledocholithiasis. 3. Cholelithiasis. 4. Similar appearance of presacral fatty masses, favored extramedullary hematopoiesis. 5. Stable lower thoracic paraspinal and bilateral posterior pleural-based masses, likewise favoring extramedullary hematopoiesis. 6. Small bilateral pleural effusions with passive atelectasis. 7. Emphysema. 8. Cardiomegaly with mitral valve calcifications. 9. Sigmoid colon diverticulosis. 10. Systemic atherosclerosis. 11. Sclerosis in the sacrum likely from remote sacral insufficiency fracture. 12. Lower thoracic and upper lumbar scoliosis. 13. Spondylosis with right foraminal impingement at L4-5 and L5-S1. Electronically signed by: Ryan Salvage MD 01/12/2025 02:30 PM EST RP Workstation: [id number]     Procedures   Medications Ordered in the ED  ceFEPIme  (MAXIPIME ) 2 g in sodium chloride  0.9 % 100 mL IVPB (2 g Intravenous New Bag/Given 01/12/25 1503)    And  metroNIDAZOLE  (FLAGYL ) IVPB 500 mg (500 mg Intravenous New Bag/Given 01/12/25 1506)  morphine  (PF) 4 MG/ML injection 4 mg (4 mg Intravenous Given 01/12/25 1057)  ondansetron  (ZOFRAN ) injection 4 mg (4 mg Intravenous Given 01/12/25 1054)  iohexol   (OMNIPAQUE ) 300 MG/ML solution 100 mL (100 mLs Intravenous Contrast Given 01/12/25 1223)  pantoprazole  (PROTONIX ) injection 40 mg (40 mg Intravenous Given 01/12/25 1217)  sodium chloride  0.9 % bolus 1,000 mL (1,000 mLs Intravenous New Bag/Given 01/12/25 1502)  diltiazem  (CARDIZEM ) injection 10 mg (10 mg Intravenous Given 01/12/25 1457)                                    Medical Decision Making  This patient is a 79 y.o. female  who presents to the ED for concern of nausea, vomiting, abdominal pain, weakness, dark stool.   Differential diagnoses prior to evaluation: The emergent differential diagnosis includes, but is not limited to,  The causes of generalized abdominal pain include but are not limited to AAA, mesenteric ischemia, appendicitis, diverticulitis, DKA, gastritis, gastroenteritis, AMI, nephrolithiasis, pancreatitis, peritonitis,  adrenal insufficiency,lead poisoning, iron toxicity, intestinal ischemia, constipation, UTI,SBO/LBO, splenic rupture, biliary disease, IBD, IBS, PUD, or hepatitis . This is not an exhaustive differential.   Past Medical History / Co-morbidities / Social History: hypertension, CKD, COPD, GERD, esophagitis, CHF, A-fib, CLL who takes Eliquis    Additional history: Chart reviewed. Pertinent results include: Reviewed outpatient hematology oncology visits  Physical Exam: Physical exam performed. The pertinent findings include: Gross melena on exam  Significant focal tenderness to palpation in the lower abdomen, right lower quadrant.   Lab Tests/Imaging studies: I personally interpreted labs/imaging and the pertinent results include: CBC notable for significant leukocytosis, white blood cells 84.9, she has CLL, normally with chronic elevated white blood cells, significantly more elevated in context of acute intra-abdominal pathology.  Her hemoglobin is fairly stable at 10.4 today.  Hemoccult is positive.  CMP notable for elevated creatinine at 1.07, but normal BUN,  mild hyponatremia, sodium 133.  CT abdomen pelvis with contrast shows  1. Abnormal pericecal collection inseparable from the appendix, suspicious for  acute appendicitis with contained periappendiceal abscess; surgical  consultation is recommended.  2. Linear calcification in the distal common bile duct suspicious for  choledocholithiasis.  3. Cholelithiasis.  4. Similar appearance of presacral fatty masses, favored extramedullary  hematopoiesis.  5. Stable lower thoracic paraspinal and bilateral posterior pleural-based  masses, likewise favoring extramedullary hematopoiesis.  6. Small bilateral pleural effusions with passive atelectasis.  7. Emphysema.  8. Cardiomegaly with mitral valve calcifications.  9. Sigmoid colon diverticulosis.  10. Systemic atherosclerosis.  11. Sclerosis in the sacrum likely from remote sacral insufficiency fracture.  12. Lower thoracic and upper lumbar scoliosis.  13. Spondylosis with right foraminal impingement at L4-5 and L5-S1.  . I agree with the radiologist interpretation.  Cardiac monitoring: EKG obtained and interpreted by myself and attending physician which shows: A-fib, mild accelerated ventricular rate at around 114.  During her ED evaluation she did develop A-fib with rapid ventricular response, heart rate into the 160s.   Consults: Spoke with surgery,who will consult after speaking to radiology to figure out a plan for possible surgery. Recommends medicine admission.  Medications: I ordered medication including Protonix  for suspected upper GI bleed, morphine , Zofran , cefepime , Flagyl  for suspected appendicitis, Cardizem , fluid bolus for A-fib with RVR.  I have reviewed the patients home medicines and have made adjustments as needed.   3:11 PM Care of [name] transferred to [facility] and Dr. Pamella at the end of my shift as the patient will require reassessment once labs/imaging have resulted. Patient presentation, ED course, and  plan of care discussed with review of all pertinent labs and imaging. Please see his/her note for further details regarding further ED course and disposition. Plan at time of handoff is patient will need admission for acute appendicitis, upper GI bleed, A-fib with RVR, with plan for surgery. This may be altered or completely changed at the discretion of the oncoming team pending results of further workup.  Final diagnoses:  None    ED Discharge Orders     None          [name] 01/12/25 1511    Doretha Folks, MD 01/12/25 1525  "

## 2025-01-12 NOTE — ED Notes (Signed)
 First poc with patient. Pt resting in bed on continuous cardiac, pulse ox and bp monitoring.  No respiratory distress noted.  PT refuses Brovana  order at this time, states does not see a need for it right now.,  VS obtained and recorded. Pt assisted to and from bedside commode, tolerated fairly well. Noted unsteady gait, but patient prefers to ambulate with assist to and from bed.

## 2025-01-12 NOTE — Consult Note (Signed)
 "    [name] 05/15/1946  [id number].    Requesting MD: Dr. 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  She states that Saturday she just wasn't feeling right, but wasn't having any abdominal pain at the time.  She then developed N/V x3 yesterday.  Today she states she noticed some pain in his RLQ that is achy.  She has not had any further vomiting today.  Her last BM was yesterday which was black.  This is new.  She denies any NSAIDs/goody powder, etc.  Fecal occult today does confirm blood in stool.  She has not had a colonoscopy in many years.  She was told with all she has going on 3-4 years ago that she shouldn't get any further colonoscopies.  She has not had an EGD.  She denies any previous episodes of melena.  She denies any other symptoms related to her abdomen before this all started on Saturday.    Here in the ED she has been found to have a WBC of 84.9, last WBC 46.9 4 weeks ago.  Hgb is 10.4, 9.5 4 weeks ago.  Cr stable at 1.07.  INR is 1.7, lipase normal.  She underwent a CT scan that reveals multiple findings.  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  We have been asked to see her.  ROS: ROS: see HPI  Family History  Problem Relation Age of Onset   Peripheral Artery Disease Father    Diabetes Father     Past Medical History:  Diagnosis Date   Arthritis    Rheumatoid arthritis   Celiac disease    Chronic kidney disease     stage 3 from MD notes   COPD (chronic obstructive pulmonary disease) (HCC)    Dyspnea    with going up stairs   Family history of adverse reaction to anesthesia    father had hard time waking up   Headache    sinus headaches   Hot flashes    Hypertension    Iron deficiency anemia    Pneumonia    per patient I have walking pneumonia    Past Surgical History:  Procedure Laterality Date   COLONOSCOPY     ECTOPIC PREGNANCY SURGERY      x 2   IR IMAGING GUIDED PORT INSERTION  01/31/2022   REVERSE SHOULDER ARTHROPLASTY Right 02/02/2017   Procedure: RIGHT REVERSE SHOULDER ARTHROPLASTY;  Surgeon: Marcey Her, MD;  Location: [facility] OR;  Service: Orthopedics;  Laterality: Right;    Social History:  reports that she quit smoking about 21 years ago. Her smoking use included cigarettes. She started smoking about 51 years ago. She has a 30 pack-year smoking history. She has never used smokeless tobacco. She reports current alcohol use of about 12.0 - 14.0 standard drinks of alcohol per week. She reports that she does not use drugs.  Allergies: Allergies[1]  (Not in a hospital admission)    Physical Exam: Blood pressure  123/88, pulse (!) 140, temperature 97.9 F (36.6 C), temperature source Oral, resp. rate 14, SpO2 93%. General: pleasant, WD, WN, but somewhat chronically ill-appearing white female who is laying in bed in NAD HEENT: head is normocephalic, atraumatic.  Sclera are noninjected.  PERRL.  Ears and nose without any masses or lesions.  Mouth is pink and dry Heart: irregular, tachy in 120-140s  Normal s1,s2. No obvious murmurs, gallops, or rubs noted.  Lungs: CTAB, no wheezes, rhonchi, or rales noted.  Respiratory effort nonlabored Abd: soft, tender focally in RLQ, no peritonitis or rebound, some distention of lower abdomen, +BS, no masses, hernias, or organomegaly Psych: A&Ox3 with an appropriate affect.   Results for orders placed or performed during the hospital encounter of  01/12/25 (from the past 48 hours)  POC occult blood, ED Provider will collect     Status: Abnormal   Collection Time: 01/12/25 10:48 AM  Result Value Ref Range   Fecal Occult Bld POSITIVE (A) NEGATIVE  Type and screen [facility]     Status: None   Collection Time: 01/12/25 10:58 AM  Result Value Ref Range   ABO/RH(D) B POS    Antibody Screen NEG    Sample Expiration      01/15/2025,2359 Performed at [facility], [address], [place], [place] [address]   Lipase, blood     Status: None   Collection Time: 01/12/25 11:23 AM  Result Value Ref Range   Lipase 26 11 - 51 U/L    Comment: Performed at [facility], [address], [place], [place] [address]  Comprehensive metabolic panel     Status: Abnormal   Collection Time: 01/12/25 11:23 AM  Result Value Ref Range   Sodium 133 (L) 135 - 145 mmol/L   Potassium 3.9 3.5 - 5.1 mmol/L   Chloride 97 (L) 98 - 111 mmol/L   CO2 23 22 - 32 mmol/L   Glucose, Bld 124 (H) 70 - 99 mg/dL    Comment: Glucose reference range applies only to samples taken after fasting for at least 8 hours.   BUN 21 8 - 23 mg/dL   Creatinine, Ser 8.92 (H) 0.44 - 1.00 mg/dL   Calcium  8.1 (L) 8.9 - 10.3 mg/dL   Total Protein 6.3 (L) 6.5 - 8.1 g/dL   Albumin 3.5 3.5 - 5.0 g/dL   AST 26 15 - 41 U/L   ALT 12 0 - 44 U/L   Alkaline Phosphatase 105 38 - 126 U/L   Total Bilirubin 0.6 0.0 - 1.2 mg/dL   GFR, Estimated 53 (L) >60 mL/min    Comment: (NOTE) Calculated using the CKD-EPI Creatinine Equation (2021)    Anion gap 13 5 - 15    Comment: Performed at [facility], [address] [place], [place] [address]  CBC     Status: Abnormal   Collection Time: 01/12/25 11:23 AM  Result Value Ref Range   WBC 84.9 (HH) 4.0 - 10.5 K/uL    Comment: REPEATED TO VERIFY This critical result has been called to MILLER, A, RN by [phone] on 01/12/2025 11:53:21, and has been read back.    RBC 3.10 (L) 3.87  - 5.11 MIL/uL   Hemoglobin 10.4 (L) 12.0 - 15.0 g/dL   HCT 67.4 (L) 63.9 - 53.9 %   MCV 104.8 (H) 80.0 - 100.0 fL   MCH 33.5 26.0 - 34.0 pg   MCHC 32.0 30.0 - 36.0 g/dL   RDW  17.2 (H) 11.5 - 15.5 %   Platelets 172 150 - 400 K/uL   nRBC 0.1 0.0 - 0.2 %    Comment: Performed at [facility], [address] [place], [place] [address]  Protime-INR     Status: Abnormal   Collection Time: 01/12/25 11:23 AM  Result Value Ref Range   Prothrombin Time 20.6 (H) 11.4 - 15.2 seconds   INR 1.7 (H) 0.8 - 1.2    Comment: (NOTE) INR goal varies based on device and disease states. Performed at [facility], [address] [place], [place] [address]   APTT     Status: None   Collection Time: 01/12/25 11:23 AM  Result Value Ref Range   aPTT 33 24 - 36 seconds    Comment: Performed at [facility], [address], [place], [place] [address]  Urinalysis, Routine w reflex microscopic -Urine, Clean Catch     Status: Abnormal   Collection Time: 01/12/25  2:07 PM  Result Value Ref Range   Color, Urine YELLOW YELLOW   APPearance CLEAR CLEAR   Specific Gravity, Urine 1.029 1.005 - 1.030   pH 6.0 5.0 - 8.0   Glucose, UA NEGATIVE NEGATIVE mg/dL   Hgb urine dipstick SMALL (A) NEGATIVE   Bilirubin Urine NEGATIVE NEGATIVE   Ketones, ur NEGATIVE NEGATIVE mg/dL   Protein, ur NEGATIVE NEGATIVE mg/dL   Nitrite NEGATIVE NEGATIVE   Leukocytes,Ua NEGATIVE NEGATIVE   RBC / HPF 0-5 0 - 5 RBC/hpf   WBC, UA 0-5 0 - 5 WBC/hpf   Bacteria, UA RARE (A) NONE SEEN   Squamous Epithelial / HPF 0-5 0 - 5 /HPF   Mucus PRESENT     Comment: Performed at [facility], [address] [place], [place] [address]   CT ABDOMEN PELVIS W CONTRAST Result Date: 01/12/2025 EXAM: CT ABDOMEN AND PELVIS WITH CONTRAST 01/12/2025 12:46:02 PM TECHNIQUE: CT of the abdomen and pelvis was performed with the administration of 100 mL of iohexol  (OMNIPAQUE ) 300 MG/ML  solution. Multiplanar reformatted images are provided for review. Automated exposure control, iterative reconstruction, and/or weight-based adjustment of the mA/kV was utilized to reduce the radiation dose to as low as reasonably achievable. COMPARISON: 11/12/2023 CLINICAL HISTORY: Nausea, vomiting, and weakness for 3 days. History of chronic myelomonocytic leukemia. * Tracking Code: BO * FINDINGS: LOWER CHEST: Stable appearance of lower thoracic paraspinal and bilateral posterior pleural based masses, the posterior pleural based mass is a short axis diameter of 1.3 cm, previously 1.2 cm. Small bilateral pleural effusions with passive atelectasis. Cardiomegaly noted with mitral valve calcifications. Emphysema. LIVER: Scattered hypodense hepatic lesions favoring cysts, the largest is in the lateral segment left hepatic lobe with fluid density and measuring 1.5 cm in long axis. GALLBLADDER AND BILE DUCTS: The dependent density in the gallbladder suspicious for small gallstone, image 28 series 2. 5 mm linear calcification posteriorly in the distal common bile duct suspicious for choledocholithiasis, image 33 series 2 and image 80 series 10. The common bile duct measures 6 mm in diameter which is within normal limits and similar to prior. No intrahepatic biliary dilatation. SPLEEN: No acute abnormality. PANCREAS: No acute abnormality. ADRENAL GLANDS: No acute abnormality. KIDNEYS, URETERS AND BLADDER: In addition to bilateral Bosniak category 1 renal cysts there are multiple hypodense renal lesions which are likely cysts but are technically too small to characterize. These lesions do not require further imaging workup. No stones in the kidneys or ureters. No hydronephrosis. No  perinephric or periureteral stranding. Urinary bladder is unremarkable. GI AND BOWEL: Abnormal pericecal collection in the expected vicinity of the appendix measuring 4.8 x 2.7 x 2.6 cm, not readily separable from the appendix, suspicious for  appendicitis possibly with contained periappendiceal abscess. Appendiceal mass is a less likely differential diagnostic consideration given the unremarkable appearance of the appendix on 11/12/2023. Correlate with the patient's symptoms. There is a mild stranding around this lesion indicating inflammation. Sigmoid colon diverticulosis. Scattered diverticula elsewhere in the descending colon. Stomach demonstrates no acute abnormality. There is no bowel obstruction. PERITONEUM AND RETROPERITONEUM: No ascites. No free air. VASCULATURE: Systemic atherosclerosis is present, including the aorta and iliac arteries. Aorta is normal in caliber. LYMPH NODES: No lymphadenopathy. REPRODUCTIVE ORGANS: No acute abnormality. 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 Sclerosis in the sacrum likely attributable to remote sacral insufficiency fracture. Extra convex lower thoracic and upper lumbar scoliosis. Right foraminal impingement of L4-L5 and L5-S1 primarily from spondylosis. IMPRESSION: 1. Abnormal pericecal collection inseparable from the appendix, suspicious for acute appendicitis with contained periappendiceal abscess; surgical consultation is recommended. 2. Linear calcification in the distal common bile duct suspicious for choledocholithiasis. 3. Cholelithiasis. 4. Similar appearance of presacral fatty masses, favored extramedullary hematopoiesis. 5. Stable lower thoracic paraspinal and bilateral posterior pleural-based masses, likewise favoring extramedullary hematopoiesis. 6. Small bilateral pleural effusions with passive atelectasis. 7. Emphysema. 8. Cardiomegaly with mitral valve calcifications. 9. Sigmoid colon diverticulosis. 10. Systemic atherosclerosis. 11. Sclerosis in  the sacrum likely from remote sacral insufficiency fracture. 12. Lower thoracic and upper lumbar scoliosis. 13. Spondylosis with right foraminal impingement at L4-5 and L5-S1. Electronically signed by: Ryan Salvage MD 01/12/2025 02:30 PM EST RP Workstation: [id number]      Assessment/Plan Acute appendicitis with periappendiceal abscess The patient has been seen, examined, labs, vitals, chart, and imaging personally reviewed and reviewed in person with IR.  She appears to likely have appendicitis with a periappendiceal abscess.  There is not air in this collection and location makes it a bit difficult to drain as well.  Recommendations from radiology are for IV abx and likely to repeat a CT in about 3 days to see if it is more amendable to intervention at that time.  Given her multiple medical problems including recent chemo, daily steroid use, Eliquis , etc, we would like to treat with conservative management at this time and avoid surgical intervention if able.  If she fails conservative management, she may require surgical intervention for which she would certainly be higher risk.  We discussed this with the patient and the husband who both understand and agree.  We would recommend bowel rest for today and IVFs.  Hold Eliqius.  Discussed plan for this issue and all issues below with the admitting hospitalist as well as with IR MD in person to help formulate a plan.   FEN - NPO/IVFs VTE - hold Eliquis , hold addition of heparin  gtt due to melena ID - Cefipime/Flagyl  Admit - per TRH  UGI Bleed/Melena/anemia - unclear etiology.  No history of ulcer disease or NSAID use.  Her hgb appears stable at this time and she is HDS stable considering her Afib with RVR currently.  Hold her Eliquis  and would hold on initiation of Heparin  gtt until we can verify that she is not continuing to have UGI bleed.  If this  persists though, she may need a GI consult for EGD Choledocholithiasis - seems to be  asymptomatic.  LFTs are normal indicating likely this to be nonocclusive.  Monitor LFTs in am.  At some point, will likely need this addressed, but as of now this is not the most acute problem A fib, RVR - per medicine CML - followed by Dr. Lanny, last chemo 3-4 weeks ago COPD CHF HTN RA - on Remicade  and Prednisone  (5mg  daily) Extramedullary hematopoiesis with large sacral tumor CKD Celiac disease   I reviewed ED provider notes, last 24 h vitals and pain scores, last 48 h intake and output, last 24 h labs and trends, and last 24 h imaging results.  Burnard FORBES Banter, PA-C [facility] 01/12/2025, 4:25 PM Please see Amion for pager number during day hours 7:00am-4:30pm or 7:00am -11:30am on weekends      [1]  Allergies Allergen Reactions   Digoxin  And Related Rash    Generalized total body rash with immense itching   Gluten Meal Diarrhea   Penicillins Rash and Other (See Comments)      Did it involve swelling of the face/tongue/throat, SOB, or low BP? No Did it involve sudden or severe rash/hives, skin peeling, or any reaction on the inside of your mouth or nose? Yes Did you need to seek medical attention at a hospital or doctor's office? No When did it last happen?    Many Years Ago  If all above answers are NO, may proceed with cephalosporin use.      Fexofenadine     Other Reaction(s): dry mouth   Alendronate Rash   Hydroxychloroquine Rash   "

## 2025-01-12 NOTE — ED Triage Notes (Signed)
 PT arrives to triage via wheelchair with complaints of nausea, vomiting, and weakness X3 days. PT took ondansetron  at approx 8AM, and it did not help.

## 2025-01-12 NOTE — ED Provider Notes (Signed)
 Burnard Hammer PA with General Surgery evaluated the patient at bedside. Please see consultation note. Dr. Collie will be admitting the patient to the medicine service.    Bernis Ernst, PA-C 01/12/25 1723    Pamella Ozell LABOR, DO 01/14/25 [phone]

## 2025-01-12 NOTE — Progress Notes (Signed)
 [name]   DOB:1946/12/10   FM#:[id number]   RDW#:[id number]  Medical oncology follow-up note  Subjective: Patient is well-known to me, under my care for CMML, she is on Vidaza  chemo every 6 weeks.  She complains of new onset nausea and right lower abdominal pain since 1 to 2 days ago, she finally agreed to come into the ED, and the CT scan showed probable acute pancreatitis.  Her husband is at bedside.   Objective:  Vitals:   01/12/25 1600 01/12/25 1712  BP: 123/88 119/70  Pulse: (!) 140 (!) 126  Resp: 14 20  Temp:  97.7 F (36.5 C)  SpO2: 93% 92%    There is no height or weight on file to calculate BMI. No intake or output data in the 24 hours ending 01/12/25 1741   Sclerae unicteric  Oropharynx clear  No peripheral adenopathy  Lungs clear -- no rales or rhonchi  Heart regular rate and rhythm  Abdomen soft with tenderness in the right lower quadrant of abdomen.  No leg edema   CBG (last 3)  No results for input(s): GLUCAP in the last 72 hours.   Labs:  Lab Results  Component Value Date   WBC 84.9 (HH) 01/12/2025   HGB 10.4 (L) 01/12/2025   HCT 32.5 (L) 01/12/2025   MCV 104.8 (H) 01/12/2025   PLT 172 01/12/2025   NEUTROABS 22.1 (H) 12/15/2024     Urine Studies No results for input(s): UHGB, CRYS in the last 72 hours.  Invalid input(s): UACOL, UAPR, USPG, UPH, UTP, UGL, UKET, UBIL, UNIT, UROB, ULEU, UEPI, UWBC, URBC, UBAC, CAST, UCOM, BILUA  Basic Metabolic Panel: Recent Labs  Lab 01/12/25 1123  NA 133*  K 3.9  CL 97*  CO2 23  GLUCOSE 124*  BUN 21  CREATININE 1.07*  CALCIUM  8.1*   GFR CrCl cannot be calculated (Unknown ideal weight.). Liver Function Tests: Recent Labs  Lab 01/12/25 1123  AST 26  ALT 12  ALKPHOS 105  BILITOT 0.6  PROT 6.3*  ALBUMIN 3.5   Recent Labs  Lab 01/12/25 1123  LIPASE 26   No results for input(s): AMMONIA in the last 168 hours. Coagulation profile Recent Labs   Lab 01/12/25 1123  INR 1.7*    CBC: Recent Labs  Lab 01/12/25 1123  WBC 84.9*  HGB 10.4*  HCT 32.5*  MCV 104.8*  PLT 172   Cardiac Enzymes: No results for input(s): CKTOTAL, CKMB, CKMBINDEX, TROPONINI in the last 168 hours. BNP: Invalid input(s): POCBNP CBG: No results for input(s): GLUCAP in the last 168 hours. D-Dimer No results for input(s): DDIMER in the last 72 hours. Hgb A1c No results for input(s): HGBA1C in the last 72 hours. Lipid Profile No results for input(s): CHOL, HDL, LDLCALC, TRIG, CHOLHDL, LDLDIRECT in the last 72 hours. Thyroid  function studies No results for input(s): TSH, T4TOTAL, T3FREE, THYROIDAB in the last 72 hours.  Invalid input(s): FREET3 Anemia work up No results for input(s): VITAMINB12, FOLATE, FERRITIN, TIBC, IRON, RETICCTPCT in the last 72 hours. Microbiology No results found for this or any previous visit (from the past 240 hours).    Studies:  CT ABDOMEN PELVIS W CONTRAST Result Date: 01/12/2025 EXAM: CT ABDOMEN AND PELVIS WITH CONTRAST 01/12/2025 12:46:02 PM TECHNIQUE: CT of the abdomen and pelvis was performed with the administration of 100 mL of iohexol  (OMNIPAQUE ) 300 MG/ML solution. Multiplanar reformatted images are provided for review. Automated exposure control, iterative reconstruction, and/or weight-based adjustment of the mA/kV was utilized to  reduce the radiation dose to as low as reasonably achievable. COMPARISON: 11/12/2023 CLINICAL HISTORY: Nausea, vomiting, and weakness for 3 days. History of chronic myelomonocytic leukemia. * Tracking Code: BO * FINDINGS: LOWER CHEST: Stable appearance of lower thoracic paraspinal and bilateral posterior pleural based masses, the posterior pleural based mass is a short axis diameter of 1.3 cm, previously 1.2 cm. Small bilateral pleural effusions with passive atelectasis. Cardiomegaly noted with mitral valve calcifications. Emphysema. LIVER:  Scattered hypodense hepatic lesions favoring cysts, the largest is in the lateral segment left hepatic lobe with fluid density and measuring 1.5 cm in long axis. GALLBLADDER AND BILE DUCTS: The dependent density in the gallbladder suspicious for small gallstone, image 28 series 2. 5 mm linear calcification posteriorly in the distal common bile duct suspicious for choledocholithiasis, image 33 series 2 and image 80 series 10. The common bile duct measures 6 mm in diameter which is within normal limits and similar to prior. No intrahepatic biliary dilatation. SPLEEN: No acute abnormality. PANCREAS: No acute abnormality. ADRENAL GLANDS: No acute abnormality. KIDNEYS, URETERS AND BLADDER: In addition to bilateral Bosniak category 1 renal cysts there are multiple hypodense renal lesions which are likely cysts but are technically too small to characterize. These lesions do not require further imaging workup. No stones in the kidneys or ureters. No hydronephrosis. No perinephric or periureteral stranding. Urinary bladder is unremarkable. GI AND BOWEL: Abnormal pericecal collection in the expected vicinity of the appendix measuring 4.8 x 2.7 x 2.6 cm, not readily separable from the appendix, suspicious for appendicitis possibly with contained periappendiceal abscess. Appendiceal mass is a less likely differential diagnostic consideration given the unremarkable appearance of the appendix on 11/12/2023. Correlate with the patient's symptoms. There is a mild stranding around this lesion indicating inflammation. Sigmoid colon diverticulosis. Scattered diverticula elsewhere in the descending colon. Stomach demonstrates no acute abnormality. There is no bowel obstruction. PERITONEUM AND RETROPERITONEUM: No ascites. No free air. VASCULATURE: Systemic atherosclerosis is present, including the aorta and iliac arteries. Aorta is normal in caliber. LYMPH NODES: No lymphadenopathy. REPRODUCTIVE ORGANS: No acute abnormality. BONES AND  SOFT TISSUES: Similar appearance of presacral masses with fatty components likely from extramedullary hematopoiesis with less likely possibilities including myelolipoma or liposarcoma. 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 Sclerosis in the sacrum likely attributable to remote sacral insufficiency fracture. Extra convex lower thoracic and upper lumbar scoliosis. Right foraminal impingement of L4-L5 and L5-S1 primarily from spondylosis. IMPRESSION: 1. Abnormal pericecal collection inseparable from the appendix, suspicious for acute appendicitis with contained periappendiceal abscess; surgical consultation is recommended. 2. Linear calcification in the distal common bile duct suspicious for choledocholithiasis. 3. Cholelithiasis. 4. Similar appearance of presacral fatty masses, favored extramedullary hematopoiesis. 5. Stable lower thoracic paraspinal and bilateral posterior pleural-based masses, likewise favoring extramedullary hematopoiesis. 6. Small bilateral pleural effusions with passive atelectasis. 7. Emphysema. 8. Cardiomegaly with mitral valve calcifications. 9. Sigmoid colon diverticulosis. 10. Systemic atherosclerosis. 11. Sclerosis in the sacrum likely from remote sacral insufficiency fracture. 12. Lower thoracic and upper lumbar scoliosis. 13. Spondylosis with right foraminal impingement at L4-5 and L5-S1. Electronically signed by: Ryan Salvage MD 01/12/2025 02:30 PM EST RP Workstation: [id number]    Assessment: 79 y.o. female   Acute appendicitis with periappendiceal abscess Atrial fibrillation with rapid ventricular response CMML, on Vidaza  COPD CHF Hypertension Rheumatoid arthritis CKD  Plan:  - Chart reviewed, especially lab and recent CT images.  Discussed the findings with patient and her husband. - Patient has been  seen by general surgeon PA, medical treatment with antibiotics is recommended, and try to avoid surgery due  to her advanced age and medical comorbidities, especially chemotherapy.  - Her last dose of chemo Vidaza  was on December 22, 2024.  If urgent surgery is eventually needed, I think is okay to proceed from CMML standpoint. -I will f/u as needed    Onita Mattock, MD 01/12/2025  5:41 PM

## 2025-01-12 NOTE — H&P (Addendum)
 " History and Physical    Patient: [name] FMW:[id number] DOB: Dec 15, 1946 DOA: 01/12/2025 DOS: the patient was seen and examined on 01/12/2025 PCP: [name], MD  Patient coming from: Home  Chief Complaint:  Chief Complaint  Patient presents with   Emesis   HPI: [name] is a 79 y.o. 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 No fevers or association with food.  She admits to drinking wine and beer daily,  but denies ever having withdrawal symptoms. Last drink yesterday.  Last dose of eliquis  yesterday afternoon.  Denies history of significant GI bleeding.  In the ED, she was in afib RVR with rates 120-140, otherwise afebrile, hemodynamically stable. WBC 84.9, hgb stable around 10. Fecal occult blood positive.  CT abdomen pelvis concerning for acute appendicitis with periappendiceal abscess, cholelithiasis with a linear calcification of the distal CBD suspicious for choledocholithiasis.   General surgery was consulted in the emergency department, recommend admission for conservative management for now.    Review of Systems: Review of Systems  Constitutional:  Negative for fever, malaise/fatigue and weight loss.  Respiratory:  Negative for cough, sputum production and shortness of breath.   Cardiovascular:  Negative for chest pain, palpitations and leg swelling.  Gastrointestinal:  Positive for abdominal pain, blood in stool, nausea and vomiting. Negative for constipation, diarrhea and heartburn.  Genitourinary:  Negative for flank pain and frequency.  Musculoskeletal:  Negative for joint pain.  Skin:  Negative for itching and rash.  Neurological:  Negative for dizziness, speech change, focal weakness and headaches.  Psychiatric/Behavioral:  Positive  for memory loss. The patient is not nervous/anxious.     Past Medical History:  Diagnosis Date   Arthritis    Rheumatoid arthritis   Celiac disease    Chronic kidney disease    stage 3 from MD notes   COPD (chronic obstructive pulmonary disease) (HCC)    Dyspnea    with going up stairs   Family history of adverse reaction to anesthesia    father had hard time waking up   Headache    sinus headaches   Hot flashes    Hypertension    Iron deficiency anemia    Pneumonia    per patient I have walking pneumonia   Past Surgical History:  Procedure Laterality Date   COLONOSCOPY     ECTOPIC PREGNANCY SURGERY      x 2   IR IMAGING GUIDED PORT INSERTION  01/31/2022   REVERSE SHOULDER ARTHROPLASTY Right 02/02/2017   Procedure: RIGHT REVERSE SHOULDER ARTHROPLASTY;  Surgeon: Marcey Her, MD;  Location: [facility] OR;  Service: Orthopedics;  Laterality: Right;   Social History:  reports that she quit smoking about 21 years ago. Her smoking use included cigarettes. She started smoking about 51 years ago. She has a 30 pack-year smoking history. She has never used smokeless tobacco. She reports current alcohol use of about 12.0 - 14.0 standard drinks of alcohol per week. She reports that she does not use drugs.  Allergies[1]  Family History  Problem Relation Age of Onset   Peripheral Artery Disease Father    Diabetes Father     Prior to Admission medications  Medication Sig Start Date End Date Taking? Authorizing Provider  acetaminophen  (TYLENOL ) 500 MG tablet Take 2 tablets (1,000 mg total) by  mouth 3 (three) times daily. 11/14/23   Samtani, Jai-Gurmukh, MD  apixaban  (ELIQUIS ) 5 MG TABS tablet TAKE 1 TABLET(5 MG) BY MOUTH TWICE DAILY 01/06/25   Tolia, Sunit, DO  azaCITIDine  (VIDAZA ) 100 MG SUSR 114mg  Injection every 6 weeks 03/29/23   [provider]  denosumab  (PROLIA ) 60 MG/ML SOSY injection Inject 60 mg into the skin every 6 (six) months.    [provider]  diltiazem   (CARDIZEM  CD) 360 MG 24 hr capsule Take 1 capsule (360 mg total) by mouth daily. 07/21/24   Swinyer, Rosaline HERO, NP  furosemide  (LASIX ) 20 MG tablet Take 1 tablet (20 mg total) by mouth daily. 05/21/22 12/15/24  Antoinette Doe, MD  gabapentin  (NEURONTIN ) 300 MG capsule Take 300 mg by mouth in the morning. 07/21/14   [provider]  inFLIXimab  (REMICADE ) 100 MG injection Inject into the vein See admin instructions.    [provider]  loratadine  (CLARITIN ) 10 MG tablet Take 10 mg by mouth daily.    [provider]  Metoprolol  Tartrate 75 MG TABS Take 2 tablets (150 mg total) by mouth 2 (two) times daily. 11/19/24   Tolia, Sunit, DO  nitrofurantoin , macrocrystal-monohydrate, (MACROBID ) 100 MG capsule Take 1 capsule (100 mg total) by mouth 2 (two) times daily. 11/25/24   Boscia, Heather E, NP  ondansetron  (ZOFRAN ) 4 MG tablet Take 1 tablet (4 mg total) by mouth every 8 (eight) hours as needed for nausea or vomiting. 09/23/24   [name], MD  predniSONE  (DELTASONE ) 5 MG tablet Take 5 mg by mouth daily with breakfast. 09/22/21   [provider]  Tiotropium Bromide-Olodaterol (STIOLTO RESPIMAT ) 2.5-2.5 MCG/ACT AERS Inhale 2 puffs into the lungs daily. 05/21/24   Malachy Comer GAILS, NP    Physical Exam: Vitals:   01/12/25 1500 01/12/25 1530 01/12/25 1600 01/12/25 1712  BP: 113/69 94/69 123/88 119/70  Pulse: 82 (!) 140 (!) 140 (!) 126  Resp: 12 16 14 20   Temp:    97.7 F (36.5 C)  TempSrc:      SpO2: 96% 97% 93% 92%   Physical Exam Vitals and nursing note reviewed.  Constitutional:      General: She is not in acute distress.    Appearance: She is not toxic-appearing.  HENT:     Head: Normocephalic and atraumatic.     Mouth/Throat:     Mouth: Mucous membranes are moist.  Eyes:     General: No scleral icterus.    Pupils: Pupils are equal, round, and reactive to light.  Cardiovascular:     Rate and Rhythm: Tachycardia present. Rhythm irregular.  Pulmonary:      Effort: Pulmonary effort is normal. No respiratory distress.  Abdominal:     General: There is distension.     Palpations: Abdomen is soft.     Tenderness: There is no abdominal tenderness. There is no guarding.  Musculoskeletal:        General: Normal range of motion.     Cervical back: Neck supple.     Right lower leg: No edema.     Left lower leg: No edema.  Skin:    General: Skin is warm and dry.     Capillary Refill: Capillary refill takes less than 2 seconds.  Neurological:     General: No focal deficit present.     Mental Status: She is alert. Mental status is at baseline.  Psychiatric:        Mood and Affect: Mood normal.  Behavior: Behavior normal.     Data Reviewed:   Labs on Admission: I have personally reviewed following labs and imaging studies  CBC: Recent Labs  Lab 01/12/25 1123  WBC 84.9*  HGB 10.4*  HCT 32.5*  MCV 104.8*  PLT 172   Basic Metabolic Panel: Recent Labs  Lab 01/12/25 1123  NA 133*  K 3.9  CL 97*  CO2 23  GLUCOSE 124*  BUN 21  CREATININE 1.07*  CALCIUM  8.1*   GFR: CrCl cannot be calculated (Unknown ideal weight.). Liver Function Tests: Recent Labs  Lab 01/12/25 1123  AST 26  ALT 12  ALKPHOS 105  BILITOT 0.6  PROT 6.3*  ALBUMIN 3.5   Recent Labs  Lab 01/12/25 1123  LIPASE 26   No results for input(s): AMMONIA in the last 168 hours. Coagulation Profile: Recent Labs  Lab 01/12/25 1123  INR 1.7*   Cardiac Enzymes: No results for input(s): CKTOTAL, CKMB, CKMBINDEX, TROPONINI in the last 168 hours. BNP (last 3 results) No results for input(s): PROBNP in the last 8760 hours. HbA1C: No results for input(s): HGBA1C in the last 72 hours. CBG: No results for input(s): GLUCAP in the last 168 hours. Lipid Profile: No results for input(s): CHOL, HDL, LDLCALC, TRIG, CHOLHDL, LDLDIRECT in the last 72 hours. Thyroid  Function Tests: No results for input(s): TSH, T4TOTAL, FREET4,  T3FREE, THYROIDAB in the last 72 hours. Anemia Panel: No results for input(s): VITAMINB12, FOLATE, FERRITIN, TIBC, IRON, RETICCTPCT in the last 72 hours. Urine analysis:    Component Value Date/Time   COLORURINE YELLOW 01/12/2025 1407   APPEARANCEUR CLEAR 01/12/2025 1407   LABSPEC 1.029 01/12/2025 1407   PHURINE 6.0 01/12/2025 1407   GLUCOSEU NEGATIVE 01/12/2025 1407   HGBUR SMALL (A) 01/12/2025 1407   BILIRUBINUR NEGATIVE 01/12/2025 1407   KETONESUR NEGATIVE 01/12/2025 1407   PROTEINUR NEGATIVE 01/12/2025 1407   UROBILINOGEN 0.2 04/28/2011 1859   NITRITE NEGATIVE 01/12/2025 1407   LEUKOCYTESUR NEGATIVE 01/12/2025 1407    Radiological Exams on Admission: CT ABDOMEN PELVIS W CONTRAST Result Date: 01/12/2025 EXAM: CT ABDOMEN AND PELVIS WITH CONTRAST 01/12/2025 12:46:02 PM TECHNIQUE: CT of the abdomen and pelvis was performed with the administration of 100 mL of iohexol  (OMNIPAQUE ) 300 MG/ML solution. Multiplanar reformatted images are provided for review. Automated exposure control, iterative reconstruction, and/or weight-based adjustment of the mA/kV was utilized to reduce the radiation dose to as low as reasonably achievable. COMPARISON: 11/12/2023 CLINICAL HISTORY: Nausea, vomiting, and weakness for 3 days. History of chronic myelomonocytic leukemia. * Tracking Code: BO * FINDINGS: LOWER CHEST: Stable appearance of lower thoracic paraspinal and bilateral posterior pleural based masses, the posterior pleural based mass is a short axis diameter of 1.3 cm, previously 1.2 cm. Small bilateral pleural effusions with passive atelectasis. Cardiomegaly noted with mitral valve calcifications. Emphysema. LIVER: Scattered hypodense hepatic lesions favoring cysts, the largest is in the lateral segment left hepatic lobe with fluid density and measuring 1.5 cm in long axis. GALLBLADDER AND BILE DUCTS: The dependent density in the gallbladder suspicious for small gallstone, image 28 series 2.  5 mm linear calcification posteriorly in the distal common bile duct suspicious for choledocholithiasis, image 33 series 2 and image 80 series 10. The common bile duct measures 6 mm in diameter which is within normal limits and similar to prior. No intrahepatic biliary dilatation. SPLEEN: No acute abnormality. PANCREAS: No acute abnormality. ADRENAL GLANDS: No acute abnormality. KIDNEYS, URETERS AND BLADDER: In addition to bilateral Bosniak category 1 renal cysts there are  multiple hypodense renal lesions which are likely cysts but are technically too small to characterize. These lesions do not require further imaging workup. No stones in the kidneys or ureters. No hydronephrosis. No perinephric or periureteral stranding. Urinary bladder is unremarkable. GI AND BOWEL: Abnormal pericecal collection in the expected vicinity of the appendix measuring 4.8 x 2.7 x 2.6 cm, not readily separable from the appendix, suspicious for appendicitis possibly with contained periappendiceal abscess. Appendiceal mass is a less likely differential diagnostic consideration given the unremarkable appearance of the appendix on 11/12/2023. Correlate with the patient's symptoms. There is a mild stranding around this lesion indicating inflammation. Sigmoid colon diverticulosis. Scattered diverticula elsewhere in the descending colon. Stomach demonstrates no acute abnormality. There is no bowel obstruction. PERITONEUM AND RETROPERITONEUM: No ascites. No free air. VASCULATURE: Systemic atherosclerosis is present, including the aorta and iliac arteries. Aorta is normal in caliber. LYMPH NODES: No lymphadenopathy. REPRODUCTIVE ORGANS: No acute abnormality. 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  Sclerosis in the sacrum likely attributable to remote sacral insufficiency fracture. Extra convex lower thoracic and upper lumbar scoliosis. Right foraminal impingement of L4-L5 and L5-S1 primarily from spondylosis. IMPRESSION: 1. Abnormal pericecal collection inseparable from the appendix, suspicious for acute appendicitis with contained periappendiceal abscess; surgical consultation is recommended. 2. Linear calcification in the distal common bile duct suspicious for choledocholithiasis. 3. Cholelithiasis. 4. Similar appearance of presacral fatty masses, favored extramedullary hematopoiesis. 5. Stable lower thoracic paraspinal and bilateral posterior pleural-based masses, likewise favoring extramedullary hematopoiesis. 6. Small bilateral pleural effusions with passive atelectasis. 7. Emphysema. 8. Cardiomegaly with mitral valve calcifications. 9. Sigmoid colon diverticulosis. 10. Systemic atherosclerosis. 11. Sclerosis in the sacrum likely from remote sacral insufficiency fracture. 12. Lower thoracic and upper lumbar scoliosis. 13. Spondylosis with right foraminal impingement at L4-5 and L5-S1. Electronically signed by: Ryan Salvage MD 01/12/2025 02:30 PM EST RP Workstation: [id number]       Assessment and Plan:  Acute appendicitis with periappendiceal abscess  -General Surgery assistance appreciated, attempt conservative management for now. repeat CT scan in 3 days  - bowel rest, IVF, supportive care  - cefepime , flagyl    Afib RVR  - hold a/c for now given pt reported melena. Resume as indicated  - resume metoprolol , diltiazem    Anemia  Melena  - pt reported at home yesterday, has not had BM today. FOBT positive in ED. Hgb stable near baseline.  - hold a/c  - protonix   - monitor/transfuse as indicated.   CMML with elevated WBC  - with worsening leukocytosis noted for the past few months per office visit notes - monitor   ETOH abuse - reports drinking wine and beer daily, no hx  withdrawals  - monitor for now    NPO  LR@75  SCD's Monitor/replace electrolytes   Advance Care Planning:   Code Status: Prior discussed with patient at time of admission   Consults: General Surgery   Family Communication: Husband at bedside   Severity of Illness: The appropriate patient status for this patient is INPATIENT. Inpatient status is judged to be reasonable and necessary in order to provide the required intensity of service to ensure the patient's safety. The patient's presenting symptoms, physical exam findings, and initial radiographic and laboratory data in the context of their chronic comorbidities is felt to place them at high risk for further  clinical deterioration. Furthermore, it is not anticipated that the patient will be medically stable for discharge from the hospital within 2 midnights of admission.   * I certify that at the point of admission it is my clinical judgment that the patient will require inpatient hospital care spanning beyond 2 midnights from the point of admission due to high intensity of service, high risk for further deterioration and high frequency of surveillance required.*  Author: Daved JAYSON Pump, DO 01/12/2025 5:18 PM  For on call review [email or web address].      [1]  Allergies Allergen Reactions   Digoxin  And Related Rash    Generalized total body rash with immense itching   Gluten Meal Diarrhea   Penicillins Rash and Other (See Comments)      Did it involve swelling of the face/tongue/throat, SOB, or low BP? No Did it involve sudden or severe rash/hives, skin peeling, or any reaction on the inside of your mouth or nose? Yes Did you need to seek medical attention at a hospital or doctor's office? No When did it last happen?    Many Years Ago  If all above answers are NO, may proceed with cephalosporin use.      Fexofenadine     Other Reaction(s): dry mouth   Alendronate Rash   Hydroxychloroquine Rash   "

## 2025-01-12 NOTE — Progress Notes (Signed)
 [facility] Liaison Note                  This patient is a current Midwife patient with [facility].   Hospital Liaison Team will follow for disposition.   Please reach out if there are questions or concerns.   Greig Basket, BSN, RN [facility] Liaison  [phone]

## 2025-01-13 DIAGNOSIS — K3533 Acute appendicitis with perforation and localized peritonitis, with abscess: Secondary | ICD-10-CM | POA: Diagnosis present

## 2025-01-13 DIAGNOSIS — I482 Chronic atrial fibrillation, unspecified: Secondary | ICD-10-CM

## 2025-01-13 DIAGNOSIS — J4489 Other specified chronic obstructive pulmonary disease: Secondary | ICD-10-CM | POA: Diagnosis not present

## 2025-01-13 DIAGNOSIS — I5022 Chronic systolic (congestive) heart failure: Secondary | ICD-10-CM | POA: Diagnosis not present

## 2025-01-13 DIAGNOSIS — C9310 Chronic myelomonocytic leukemia not having achieved remission: Secondary | ICD-10-CM | POA: Diagnosis not present

## 2025-01-13 LAB — COMPREHENSIVE METABOLIC PANEL WITH GFR
ALT: 10 U/L (ref 0–44)
AST: 22 U/L (ref 15–41)
Albumin: 2.8 g/dL — ABNORMAL LOW (ref 3.5–5.0)
Alkaline Phosphatase: 80 U/L (ref 38–126)
Anion gap: 8 (ref 5–15)
BUN: 16 mg/dL (ref 8–23)
CO2: 21 mmol/L — ABNORMAL LOW (ref 22–32)
Calcium: 6.5 mg/dL — ABNORMAL LOW (ref 8.9–10.3)
Chloride: 105 mmol/L (ref 98–111)
Creatinine, Ser: 0.95 mg/dL (ref 0.44–1.00)
GFR, Estimated: >60 mL/min
Glucose, Bld: 73 mg/dL (ref 70–99)
Potassium: 4.2 mmol/L (ref 3.5–5.1)
Sodium: 134 mmol/L — ABNORMAL LOW (ref 135–145)
Total Bilirubin: 0.4 mg/dL (ref 0.0–1.2)
Total Protein: 4.9 g/dL — ABNORMAL LOW (ref 6.5–8.1)

## 2025-01-13 LAB — MAGNESIUM: Magnesium: 2.4 mg/dL (ref 1.7–2.4)

## 2025-01-13 LAB — CBC
HCT: 29.8 % — ABNORMAL LOW (ref 36.0–46.0)
Hemoglobin: 9.1 g/dL — ABNORMAL LOW (ref 12.0–15.0)
MCH: 33.1 pg (ref 26.0–34.0)
MCHC: 30.5 g/dL (ref 30.0–36.0)
MCV: 108.4 fL — ABNORMAL HIGH (ref 80.0–100.0)
Platelets: 140 K/uL — ABNORMAL LOW (ref 150–400)
RBC: 2.75 MIL/uL — ABNORMAL LOW (ref 3.87–5.11)
RDW: 17.2 % — ABNORMAL HIGH (ref 11.5–15.5)
WBC: 72.3 K/uL (ref 4.0–10.5)
nRBC: 0 % (ref 0.0–0.2)

## 2025-01-13 LAB — MRSA NEXT GEN BY PCR, NASAL: MRSA by PCR Next Gen: NOT DETECTED

## 2025-01-13 MED ORDER — SODIUM CHLORIDE 0.9 % IV SOLN
2.0000 g | Freq: Two times a day (BID) | INTRAVENOUS | Status: DC
  Administered 2025-01-14 – 2025-01-19 (×11): 2 g via INTRAVENOUS
  Filled 2025-01-13 (×11): qty 12.5

## 2025-01-13 MED ORDER — ORAL CARE MOUTH RINSE: 15.0000 mL | OROMUCOSAL | Status: DC | PRN

## 2025-01-13 MED ORDER — DILTIAZEM LOAD VIA INFUSION
15.0000 mg | Freq: Once | INTRAVENOUS | Status: AC
  Administered 2025-01-13: 15 mg via INTRAVENOUS
  Filled 2025-01-13: qty 15

## 2025-01-13 MED ORDER — PREDNISONE 5 MG PO TABS
5.0000 mg | ORAL_TABLET | Freq: Every day | ORAL | Status: DC
  Administered 2025-01-13 – 2025-01-20 (×7): 5 mg via ORAL
  Filled 2025-01-13 (×7): qty 1

## 2025-01-13 MED ORDER — DILTIAZEM HCL-DEXTROSE 125-5 MG/125ML-% IV SOLN (PREMIX)
5.0000 mg/h | INTRAVENOUS | Status: DC
  Administered 2025-01-13: 5 mg/h via INTRAVENOUS
  Administered 2025-01-14: 15 mg/h via INTRAVENOUS
  Administered 2025-01-15: 10 mg/h via INTRAVENOUS
  Filled 2025-01-13 (×4): qty 125

## 2025-01-13 MED ORDER — SODIUM CHLORIDE 0.9% FLUSH
10.0000 mL | Freq: Two times a day (BID) | INTRAVENOUS | Status: DC
  Administered 2025-01-14 (×2): 20 mL
  Administered 2025-01-14 – 2025-01-15 (×2): 10 mL
  Administered 2025-01-15: 20 mL
  Administered 2025-01-16: 10 mL
  Administered 2025-01-16: 40 mL
  Administered 2025-01-17 – 2025-01-19 (×4): 10 mL

## 2025-01-13 MED ORDER — CHLORHEXIDINE GLUCONATE CLOTH 2 % EX PADS
6.0000 | MEDICATED_PAD | Freq: Every day | CUTANEOUS | Status: DC
  Administered 2025-01-13 – 2025-01-20 (×8): 6 via TOPICAL

## 2025-01-13 NOTE — ED Notes (Signed)
 Care delay: late with diltiazem  titrations d/t nurse having 2 other critical patients

## 2025-01-13 NOTE — ED Notes (Signed)
 MD notified of systolic pressure below 90. Pt does not report light headedness or symptoms

## 2025-01-13 NOTE — ED Notes (Signed)
 NP aware of inability to titrate diltiazem  up d/t pt pressure not being high enough

## 2025-01-13 NOTE — ED Notes (Signed)
 Per pt request, nasal cannula removed; pt educated, pt still requested for it to be off of her. Nurse will continue to monitor O2 saturation

## 2025-01-13 NOTE — Progress Notes (Addendum)
 " PROGRESS NOTE  [name] FMW:[id number] DOB: June 01, 1946   PCP: [name], MD  Patient is from: Home.  Lives with husband.  Uses rolling walker for mobility.  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    In ED, in A-fib with RVR with HR from 120s to 140s.  WBC 84.9 (baseline 40-50).  Hgb 10 (baseline 9-10).  Fecal Hemoccult positive.  CT abdomen and pelvis showed acute appendicitis with periappendiceal abscess, cholelithiasis with linear calcification of the distal CBD suspicious for choledocholithiasis.  General surgery consulted.  Patient was started on IV cefepime  and Flagyl , IV fluids and admitted for further care.   Subjective: Seen and examined earlier this morning.  No major events overnight or this morning.  No major complaints other than RLQ pain after she had abdominal exam by provider earlier.  Denies nausea or vomiting.  Denies chest pain, shortness of breath, palpitation or dizziness.    Assessment and plan: Acute appendicitis with periappendiceal abscess-presents with RLQ pain, nausea and vomiting.  Markedly elevated leukocytosis above baseline.  -Continue IV cefepime  and Flagyl . -Surgery recommended conservative management with IV antibiotics and repeat CT in 3 days -Continue gentle IV fluid cautiously given cardiomyopathy   Persistent A-fib with RVR: RVR could be due to missed dose of medication and infection as above. -Start Cardizem  drip -Discontinue p.o. Cardizem  and metoprolol  -Appreciate help by cardiology. -Follow  echocardiogram. -Optimize electrolytes. -Admitted to stepdown unit. - Holding Eliquis  due to melanotic stool.  Normocytic anemia: Patient reports melanotic stool.  Heme occult positive but H&H relatively at baseline. -Continue IV Protonix . -Continue holding anticoagulation for now  Chronic HFmrEF: TTE in 2023 with LVEF of 45 to 50%, GH, indeterminate DD, RVSP of 38.2 mmHg.  Overall, appears euvolemic.  No cardiopulmonary symptoms.  Seems to be on p.o. Lasix  20 mg daily at home. - Continue monitoring fluid status. - Strict intake and output, daily weight - Follow echocardiogram  Cholelithiasis with possible choledocholithiasis: CT raise concern for choledocholithiasis but LFT within normal. - Monitor clinically.  Physical deconditioning: Patient reports ambulating with walker at baseline. - PT/OT  Chronic COPD: Stable. 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    CMML with elevated WBC:  - Appreciate input by oncology.   Alcohol use: No withdrawal symptoms. - Continue monitoring  Thrombocytopenia: Mild - Continue monitoring  Nutrition Body mass index is 18.71 kg/m. - Will consult dietitian when she started eating          Pressure skin injury: She has dark blister on right leg. -Consult wound care DVT prophylaxis:  SCDs Start: 01/12/25 1820  Code Status: Full code Family Communication: Updated patient's husband at bedside Level of care: Stepdown Status is: Inpatient Remains inpatient appropriate because: Acute appendicitis with periappendiceal abscess, A-fib with RVR,   Final disposition: Yet to be determined.   55 minutes with more than 50% spent in reviewing records, counseling patient/family and coordinating care.  Consultants:  General Surgery Cardiology Oncology/hematology  Procedures: None  Microbiology summarized: None  Objective: Vitals:   01/13/25 1048 01/13/25 1130 01/13/25 1143  01/13/25 1149  BP: (!) 108/59 106/75 106/75 106/74  Pulse:  (!) 145 (!) 116 (!) 134  Resp:  19 18 20   Temp:   100 F (37.8 C) 98.7 F (37.1 C)  TempSrc:   Oral Oral  SpO2:  92% 91% 92%  Weight:      Height:        Examination:  GENERAL: No apparent distress.  Nontoxic. HEENT: MMM.  Vision and hearing grossly intact.  NECK: Supple.  No apparent JVD.  RESP:  No IWOB.  Fair aeration bilaterally. CVS: IRR.  HR in 130s.SABRA Heart sounds normal.  ABD/GI/GU: BS+.  Patient declined full abdominal exam due to pain from prior exam. MSK/EXT:  Moves extremities. No apparent deformity.  1+ BLE edema. Bloody blister/bulla over LLE. SKIN: As above. NEURO: AA.  Oriented appropriately.  No apparent focal neuro deficit. PSYCH: Calm. Normal affect.   Sch Meds:  Scheduled Meds:  arformoterol   15 mcg Nebulization BID   And   umeclidinium bromide   1 puff Inhalation Daily   gabapentin   300 mg Oral Daily   pantoprazole  (PROTONIX ) IV  40 mg Intravenous Q24H   predniSONE   5 mg Oral Q breakfast   Continuous Infusions:  ceFEPime  (MAXIPIME ) IV Stopped (01/13/25 9351)   diltiazem  (CARDIZEM ) infusion 5 mg/hr (01/13/25 1150)   lactated ringers  75 mL/hr at 01/13/25 1155   metronidazole  Stopped (01/13/25 0431)   PRN Meds:.acetaminophen  **OR** acetaminophen , morphine  injection, ondansetron  **OR** ondansetron  (ZOFRAN ) IV  Antimicrobials: Anti-infectives (From admission, onward)    Start     Dose/Rate Route Frequency Ordered Stop   01/12/25 1830  metroNIDAZOLE  (FLAGYL ) IVPB 500 mg        500 mg 100 mL/hr over 60 Minutes Intravenous Every 12 hours 01/12/25 1822     01/12/25 1830  ceFEPIme  (MAXIPIME ) 2 g in sodium chloride  0.9 % 100 mL IVPB        2 g 200 mL/hr over 30 Minutes Intravenous Every 8 hours 01/12/25 1825     01/12/25 1445  ceFEPIme  (MAXIPIME ) 2 g in sodium chloride  0.9 % 100 mL IVPB       Placed in And Linked Group   2 g 200 mL/hr over 30 Minutes Intravenous  Once 01/12/25 1436 01/12/25  1533   01/12/25 1445  metroNIDAZOLE  (FLAGYL ) IVPB 500 mg       Placed in And Linked Group   500 mg 100 mL/hr over 60 Minutes Intravenous  Once 01/12/25 1436 01/12/25 1606        I have personally reviewed the following labs and images: CBC: Recent Labs  Lab 01/12/25 1123 01/13/25 0615  WBC 84.9* 72.3*  HGB 10.4* 9.1*  HCT 32.5* 29.8*  MCV 104.8* 108.4*  PLT 172 140*   BMP &GFR Recent Labs  Lab 01/12/25 1123 01/12/25 2349 01/13/25 0615  NA 133*  --  134*  K 3.9  --  4.2  CL 97*  --  105  CO2 23  --  21*  GLUCOSE 124*  --  73  BUN 21  --  16  CREATININE 1.07*  --  0.95  CALCIUM  8.1*  --  6.5*  MG  --  2.4  --    Estimated Creatinine Clearance: 36.9 mL/min (by C-G formula based on SCr of 0.95 mg/dL). Liver & Pancreas: Recent Labs  Lab 01/12/25 1123 01/13/25 0615  AST 26 22  ALT 12 10  ALKPHOS 105 80  BILITOT 0.6 0.4  PROT 6.3* 4.9*  ALBUMIN 3.5 2.8*   Recent Labs  Lab 01/12/25 1123  LIPASE 26   No results for input(s): AMMONIA in the last 168 hours. Diabetic: No results for input(s): HGBA1C in the last 72 hours. No results for input(s): GLUCAP in the last 168 hours. Cardiac Enzymes: No results for input(s): CKTOTAL, CKMB, CKMBINDEX, TROPONINI in the last 168 hours. No results for input(s): PROBNP in the last 8760 hours. Coagulation Profile: Recent Labs  Lab 01/12/25 1123  INR 1.7*   Thyroid  Function Tests: No results for input(s): TSH, T4TOTAL, FREET4, T3FREE, THYROIDAB in the last 72 hours. Lipid Profile: No results for input(s): CHOL, HDL, LDLCALC, TRIG, CHOLHDL, LDLDIRECT in the last 72 hours. Anemia Panel: No results for input(s): VITAMINB12, FOLATE, FERRITIN, TIBC, IRON, RETICCTPCT in the last 72 hours. Urine analysis:    Component Value Date/Time   COLORURINE YELLOW 01/12/2025 1407   APPEARANCEUR CLEAR 01/12/2025 1407   LABSPEC 1.029 01/12/2025 1407   PHURINE 6.0 01/12/2025 1407    GLUCOSEU NEGATIVE 01/12/2025 1407   HGBUR SMALL (A) 01/12/2025 1407   BILIRUBINUR NEGATIVE 01/12/2025 1407   KETONESUR NEGATIVE 01/12/2025 1407   PROTEINUR NEGATIVE 01/12/2025 1407   UROBILINOGEN 0.2 04/28/2011 1859   NITRITE NEGATIVE 01/12/2025 1407   LEUKOCYTESUR NEGATIVE 01/12/2025 1407   Sepsis Labs: Invalid input(s): PROCALCITONIN, LACTICIDVEN  Microbiology: No results found for this or any previous visit (from the past 240 hours).  Radiology Studies: CT ABDOMEN PELVIS W CONTRAST Result Date: 01/12/2025 EXAM: CT ABDOMEN AND PELVIS WITH CONTRAST 01/12/2025 12:46:02 PM TECHNIQUE: CT of the abdomen and pelvis was performed with the administration of 100 mL of iohexol  (OMNIPAQUE ) 300 MG/ML solution. Multiplanar reformatted images are provided for review. Automated exposure control, iterative reconstruction, and/or weight-based adjustment of the mA/kV was utilized to reduce the radiation dose to as low as reasonably achievable. COMPARISON: 11/12/2023 CLINICAL HISTORY: Nausea, vomiting, and weakness for 3 days. History of chronic myelomonocytic leukemia. * Tracking Code: BO * FINDINGS: LOWER CHEST: Stable appearance of lower thoracic paraspinal and bilateral posterior pleural based masses, the posterior pleural based mass is a short axis diameter of 1.3 cm, previously 1.2 cm. Small bilateral pleural effusions with passive atelectasis. Cardiomegaly noted with mitral valve calcifications. Emphysema. LIVER: Scattered hypodense hepatic lesions favoring cysts, the largest is in the lateral segment left hepatic lobe with fluid density and measuring 1.5 cm in long axis. GALLBLADDER AND BILE DUCTS: The dependent density in the gallbladder suspicious for small gallstone, image 28 series 2. 5 mm linear calcification posteriorly in the distal common bile duct suspicious for choledocholithiasis, image 33 series 2 and image 80 series 10. The common bile duct measures 6 mm in diameter which is within normal  limits and similar to prior. No intrahepatic biliary dilatation. SPLEEN: No acute abnormality. PANCREAS: No acute abnormality. ADRENAL GLANDS: No acute abnormality. KIDNEYS, URETERS AND BLADDER: In addition to bilateral Bosniak category 1 renal cysts there are multiple hypodense renal lesions which are likely cysts but are technically too small to characterize. These lesions do not require further imaging workup. No stones in the kidneys or ureters. No hydronephrosis. No perinephric or periureteral stranding. Urinary bladder is unremarkable. GI AND BOWEL: Abnormal pericecal collection in the expected vicinity of the appendix measuring 4.8 x 2.7 x 2.6 cm, not readily separable from the appendix, suspicious for appendicitis possibly with contained periappendiceal abscess. Appendiceal mass is a less likely differential diagnostic consideration given the unremarkable appearance of the appendix on  11/12/2023. Correlate with the patient's symptoms. There is a mild stranding around this lesion indicating inflammation. Sigmoid colon diverticulosis. Scattered diverticula elsewhere in the descending colon. Stomach demonstrates no acute abnormality. There is no bowel obstruction. PERITONEUM AND RETROPERITONEUM: No ascites. No free air. VASCULATURE: Systemic atherosclerosis is present, including the aorta and iliac arteries. Aorta is normal in caliber. LYMPH NODES: No lymphadenopathy. REPRODUCTIVE ORGANS: No acute abnormality. 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 Sclerosis in the sacrum likely attributable to remote sacral insufficiency fracture. Extra convex lower thoracic and upper lumbar scoliosis. Right foraminal impingement of L4-L5 and L5-S1 primarily from spondylosis.  IMPRESSION: 1. Abnormal pericecal collection inseparable from the appendix, suspicious for acute appendicitis with contained periappendiceal abscess; surgical consultation is recommended. 2. Linear calcification in the distal common bile duct suspicious for choledocholithiasis. 3. Cholelithiasis. 4. Similar appearance of presacral fatty masses, favored extramedullary hematopoiesis. 5. Stable lower thoracic paraspinal and bilateral posterior pleural-based masses, likewise favoring extramedullary hematopoiesis. 6. Small bilateral pleural effusions with passive atelectasis. 7. Emphysema. 8. Cardiomegaly with mitral valve calcifications. 9. Sigmoid colon diverticulosis. 10. Systemic atherosclerosis. 11. Sclerosis in the sacrum likely from remote sacral insufficiency fracture. 12. Lower thoracic and upper lumbar scoliosis. 13. Spondylosis with right foraminal impingement at L4-5 and L5-S1. Electronically signed by: Ryan Salvage MD 01/12/2025 02:30 PM EST RP Workstation: [id number]      Ujbz T. Jashley Yellin [facility]  If 7PM-7AM, please contact night-coverage [email or web address] 01/13/2025, 12:11 PM   "

## 2025-01-13 NOTE — Progress Notes (Signed)
 "  Progress Note     Subjective: Family member at bedside.  Patient denies pain at rest. Currently NPO. Denies n/v. Has not had BM since admission. Having flatulence. Wanting to eat.  ROS  All negative with the exception of above.  Objective: Vital signs in last 24 hours: Temp:  [97.5 F (36.4 C)-100 F (37.8 C)] 98.7 F (37.1 C) (01/20 1149) Pulse Rate:  [82-156] 134 (01/20 1149) Resp:  [12-28] 20 (01/20 1149) BP: (94-129)/(53-108) 103/62 (01/20 1223) SpO2:  [85 %-99 %] 92 % (01/20 1149) Weight:  [47.9 kg] 47.9 kg (01/19 2108)    Intake/Output from previous day: 01/19 0701 - 01/20 0700 In: 297.9 [IV Piggyback:297.9] Out: -  Intake/Output this shift: No intake/output data recorded.  PE: General: Pleasant female who is laying in bed in NAD. HEENT: Head is normocephalic, atraumatic. Mouth is pink and moist. Heart: HR elevated during encounter.  Lungs: Respiratory effort nonlabored on room air.  Abd: Soft with some distention. Tenderness to palpation of RLQ. MS: Able to move all 4 extremities.  Skin: Warm and dry.  Psych: A&Ox3 with an appropriate affect.    Lab Results:  Recent Labs    01/12/25 1123 01/13/25 0615  WBC 84.9* 72.3*  HGB 10.4* 9.1*  HCT 32.5* 29.8*  PLT 172 140*   BMET Recent Labs    01/12/25 1123 01/13/25 0615  NA 133* 134*  K 3.9 4.2  CL 97* 105  CO2 23 21*  GLUCOSE 124* 73  BUN 21 16  CREATININE 1.07* 0.95  CALCIUM  8.1* 6.5*   PT/INR Recent Labs    01/12/25 1123  LABPROT 20.6*  INR 1.7*   CMP     Component Value Date/Time   NA 134 (L) 01/13/2025 0615   NA 141 01/04/2016 1001   K 4.2 01/13/2025 0615   K 4.5 01/04/2016 1001   CL 105 01/13/2025 0615   CO2 21 (L) 01/13/2025 0615   CO2 24 01/04/2016 1001   GLUCOSE 73 01/13/2025 0615   GLUCOSE 105 01/04/2016 1001   BUN 16 01/13/2025 0615   BUN 21.7 01/04/2016 1001   CREATININE 0.95 01/13/2025 0615   CREATININE 1.48 (H) 12/15/2024 1057   CREATININE 1.2 (H) 01/04/2016  1001   CALCIUM  6.5 (L) 01/13/2025 0615   CALCIUM  9.7 01/04/2016 1001   PROT 4.9 (L) 01/13/2025 0615   PROT 7.0 01/04/2016 1001   ALBUMIN 2.8 (L) 01/13/2025 0615   ALBUMIN 4.0 01/04/2016 1001   AST 22 01/13/2025 0615   AST 21 12/21/2023 1359   AST 26 01/04/2016 1001   ALT 10 01/13/2025 0615   ALT 14 12/21/2023 1359   ALT 24 01/04/2016 1001   ALKPHOS 80 01/13/2025 0615   ALKPHOS 61 01/04/2016 1001   BILITOT 0.4 01/13/2025 0615   BILITOT 0.8 12/21/2023 1359   BILITOT 0.41 01/04/2016 1001   GFRNONAA >60 01/13/2025 0615   GFRNONAA 36 (L) 12/15/2024 1057   GFRAA 56 (L) 01/09/2020 1339   Lipase     Component Value Date/Time   LIPASE 26 01/12/2025 1123       Studies/Results: CT ABDOMEN PELVIS W CONTRAST Result Date: 01/12/2025 EXAM: CT ABDOMEN AND PELVIS WITH CONTRAST 01/12/2025 12:46:02 PM TECHNIQUE: CT of the abdomen and pelvis was performed with the administration of 100 mL of iohexol  (OMNIPAQUE ) 300 MG/ML solution. Multiplanar reformatted images are provided for review. Automated exposure control, iterative reconstruction, and/or weight-based adjustment of the mA/kV was utilized to reduce the radiation dose to as low as reasonably  achievable. COMPARISON: 11/12/2023 CLINICAL HISTORY: Nausea, vomiting, and weakness for 3 days. History of chronic myelomonocytic leukemia. * Tracking Code: BO * FINDINGS: LOWER CHEST: Stable appearance of lower thoracic paraspinal and bilateral posterior pleural based masses, the posterior pleural based mass is a short axis diameter of 1.3 cm, previously 1.2 cm. Small bilateral pleural effusions with passive atelectasis. Cardiomegaly noted with mitral valve calcifications. Emphysema. LIVER: Scattered hypodense hepatic lesions favoring cysts, the largest is in the lateral segment left hepatic lobe with fluid density and measuring 1.5 cm in long axis. GALLBLADDER AND BILE DUCTS: The dependent density in the gallbladder suspicious for small gallstone, image 28  series 2. 5 mm linear calcification posteriorly in the distal common bile duct suspicious for choledocholithiasis, image 33 series 2 and image 80 series 10. The common bile duct measures 6 mm in diameter which is within normal limits and similar to prior. No intrahepatic biliary dilatation. SPLEEN: No acute abnormality. PANCREAS: No acute abnormality. ADRENAL GLANDS: No acute abnormality. KIDNEYS, URETERS AND BLADDER: In addition to bilateral Bosniak category 1 renal cysts there are multiple hypodense renal lesions which are likely cysts but are technically too small to characterize. These lesions do not require further imaging workup. No stones in the kidneys or ureters. No hydronephrosis. No perinephric or periureteral stranding. Urinary bladder is unremarkable. GI AND BOWEL: Abnormal pericecal collection in the expected vicinity of the appendix measuring 4.8 x 2.7 x 2.6 cm, not readily separable from the appendix, suspicious for appendicitis possibly with contained periappendiceal abscess. Appendiceal mass is a less likely differential diagnostic consideration given the unremarkable appearance of the appendix on 11/12/2023. Correlate with the patient's symptoms. There is a mild stranding around this lesion indicating inflammation. Sigmoid colon diverticulosis. Scattered diverticula elsewhere in the descending colon. Stomach demonstrates no acute abnormality. There is no bowel obstruction. PERITONEUM AND RETROPERITONEUM: No ascites. No free air. VASCULATURE: Systemic atherosclerosis is present, including the aorta and iliac arteries. Aorta is normal in caliber. LYMPH NODES: No lymphadenopathy. REPRODUCTIVE ORGANS: No acute abnormality. 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 In conjunction with the findings in the chest, extramedullary hematopoiesis is strongly  favored. Sclerosis in the sacrum likely attributable to remote sacral insufficiency fracture. Extra convex lower thoracic and upper lumbar scoliosis. Right foraminal impingement of L4-L5 and L5-S1 primarily from spondylosis. IMPRESSION: 1. Abnormal pericecal collection inseparable from the appendix, suspicious for acute appendicitis with contained periappendiceal abscess; surgical consultation is recommended. 2. Linear calcification in the distal common bile duct suspicious for choledocholithiasis. 3. Cholelithiasis. 4. Similar appearance of presacral fatty masses, favored extramedullary hematopoiesis. 5. Stable lower thoracic paraspinal and bilateral posterior pleural-based masses, likewise favoring extramedullary hematopoiesis. 6. Small bilateral pleural effusions with passive atelectasis. 7. Emphysema. 8. Cardiomegaly with mitral valve calcifications. 9. Sigmoid colon diverticulosis. 10. Systemic atherosclerosis. 11. Sclerosis in the sacrum likely from remote sacral insufficiency fracture. 12. Lower thoracic and upper lumbar scoliosis. 13. Spondylosis with right foraminal impingement at L4-5 and L5-S1. Electronically signed by: Ryan Salvage MD 01/12/2025 02:30 PM EST RP Workstation: [id number]    Anti-infectives: Anti-infectives (From admission, onward)    Start     Dose/Rate Route Frequency Ordered Stop   01/12/25 1830  metroNIDAZOLE  (FLAGYL ) IVPB 500 mg        500 mg 100 mL/hr over 60 Minutes Intravenous Every 12 hours 01/12/25 1822     01/12/25 1830  ceFEPIme  (MAXIPIME ) 2 g in  sodium chloride  0.9 % 100 mL IVPB        2 g 200 mL/hr over 30 Minutes Intravenous Every 8 hours 01/12/25 1825     01/12/25 1445  ceFEPIme  (MAXIPIME ) 2 g in sodium chloride  0.9 % 100 mL IVPB       Placed in And Linked Group   2 g 200 mL/hr over 30 Minutes Intravenous  Once 01/12/25 1436 01/12/25 1533   01/12/25 1445  metroNIDAZOLE  (FLAGYL ) IVPB 500 mg       Placed in And Linked Group   500 mg 100 mL/hr over  60 Minutes Intravenous  Once 01/12/25 1436 01/12/25 1606        Assessment/Plan Acute appendicitis with periappendiceal abscess -Likely to have appendicitis with a periappendiceal abscess. Reviewed with IR and there is not air in this collection and location makes it a bit difficult to drain as well. -Afebrile on last set of vitals but Tmax 100F. Elevated HR during encounter of 108. -WBC decreased from 72.3 from 84.9; HGB 9.1 from 10.4. -Continue with IV abx and will plan to repeat CT around 1/22 to see if it is more amendable to intervention at that time. -Given her multiple medical problems including recent chemo, daily steroid use, Eliquis , etc, we pan to continue to treat with conservative management at this time and avoid surgical intervention if able.   -If she fails conservative management, she may require surgical intervention for which she would certainly be higher risk.  -Okay for CLD. -Will continue to follow.   FEN - CLD; IVFs per primary team VTE - Hold Eliquis , hold addition of heparin  gtt due to melena ID - Cefipime/Flagyl    UGI Bleed/Melena/anemia - unclear etiology.  No history of ulcer disease or NSAID use.  Her hgb appears stable at this time and she is HDS stable considering her Afib with RVR.   Choledocholithiasis - seems to be asymptomatic.  LFTs are normal indicating likely this to be nonocclusive.  Monitor LFTs in am.  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  This care  required moderate level of medical decision making.   Marjorie Carlyon Favre, [facility] 01/13/2025, 12:34 PM Please see Amion for pager number during day hours 7:00am-4:30pm  "

## 2025-01-13 NOTE — Consult Note (Signed)
 "  Cardiology Consultation   Patient ID: LAURINDA CARRENO MRN: [id number]; DOB: Apr 15, 1946  Admit date: 01/12/2025 Date of Consult: 01/13/2025  PCP:  [name], MD   [facility] Providers Cardiologist:  Madonna Large, DO      Patient Profile: FONDA ROCHON is a 79 y.o. 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  History of Present Illness: Ms. Thackston is a 79 year old female with prior cardiac history listed below.  The patient was initially diagnosed with atrial fibrillation back in 2023.  On 03/2022 the patient was hospitalized for concerns of shortness of breath and palpitations.  She was found to be in A-fib with RVR.  She was started on anticoagulation but this later needed to be held due to concerns of bleeding.  Because of this she was not a candidate for a cardioversion.  An echocardiogram was done and showed a moderately reduced LVEF of 40 to 45%.  She was felt to be a poor candidate for amiodarone  given conversion risk and prolonged QTc.  She was rate controlled on metoprolol , Cardizem , and digoxin .  Patient was last seen in the clinic by Dr. Large on 08/2024.  At that visit the patient was in atrial flutter.  Her medications included metoprolol  75 mg twice daily, Cardizem  360 mg daily, and Eliquis  5 mg twice daily.  A cardioversion was discussed with the patient but she preferred a rate control strategy.  Patient presented to the hospital for concerns of nausea and vomiting.  CT abdomen and pelvis was done and indicated that the patient had acute appendicitis with a periappendiceal abscess.  Surgery was consulted.  Surgery is currently favoring a conservative management approach but is continuing to follow the patient.  Due to concerns for anemia and  a GI bleed the patient's anticoagulation was held.  EKG showed atrial flutter with RVR.  Patient was started on IV Cardizem .  Most recent blood pressure was 99/67.  On interview the patient report that she is asymptomatic with her atrial fibrillation.  Stated that her heart rates are typically in the 110s and 120s.  Denies any chest pain, shortness of breath, orthopnea, lower extremity edema.  Reported that she had melena on Monday prior to being admitted to the hospital.  Patient reported that she prefers to do a rate control strategy over a rhythm control strategy.  Labs showed positive FOBT, potassium of 4.2, creatinine of 0.95, calcium  of 6.5, albumin of 2.8, LFTs within normal limits, leukocytosis with a WBC count 72.3, anemia with a hemoglobin of 9.1  EKG showed atrial flutter with a ventricular rate of 112 and variable AV node conduction.  CT abdomen and pelvis found acute appendicitis with contained periappendiceal abscess, linear calcification suspicious for choledocholithiasis, masses favoring extra-medullary hematopoiesis, small bilateral pleural effusions, cardiomegaly with mitral valve calcifications, and systemic atherosclerosis   Past Medical History:  Diagnosis Date   Arthritis    Rheumatoid arthritis   Celiac disease    Chronic kidney disease    stage 3 from MD notes   COPD (chronic obstructive pulmonary disease) (HCC)    Dyspnea    with going up stairs   Family history of adverse reaction to anesthesia    father had hard time waking up   Headache    sinus headaches  Hot flashes    Hypertension    Iron deficiency anemia    Pneumonia    per patient I have walking pneumonia    Past Surgical History:  Procedure Laterality Date   COLONOSCOPY     ECTOPIC PREGNANCY SURGERY      x 2   IR IMAGING GUIDED PORT INSERTION  01/31/2022   REVERSE SHOULDER ARTHROPLASTY Right 02/02/2017   Procedure: RIGHT REVERSE SHOULDER ARTHROPLASTY;  Surgeon: Marcey Her, MD;  Location: [facility]  OR;  Service: Orthopedics;  Laterality: Right;     Home Medications:  Prior to Admission medications  Medication Sig Start Date End Date Taking? Authorizing Provider  acetaminophen  (TYLENOL ) 500 MG tablet Take 2 tablets (1,000 mg total) by mouth 3 (three) times daily. Patient taking differently: Take 1,000 mg by mouth daily at 12 noon. 11/14/23  Yes Samtani, Jai-Gurmukh, MD  apixaban  (ELIQUIS ) 5 MG TABS tablet TAKE 1 TABLET(5 MG) BY MOUTH TWICE DAILY 01/06/25  Yes Tolia, Sunit, DO  azaCITIDine  (VIDAZA ) 100 MG SUSR Inject 114 mg into the vein every 6 (six) weeks. 03/29/23  Yes [provider]  CLARITIN  10 MG tablet Take 10 mg by mouth daily.   Yes [provider]  denosumab  (PROLIA ) 60 MG/ML SOSY injection Inject 60 mg into the skin every 6 (six) months.   Yes [provider]  diltiazem  (CARDIZEM  CD) 360 MG 24 hr capsule Take 1 capsule (360 mg total) by mouth daily. 07/21/24  Yes Swinyer, Rosaline HERO, NP  furosemide  (LASIX ) 20 MG tablet Take 20 mg by mouth in the morning.   Yes [provider]  gabapentin  (NEURONTIN ) 300 MG capsule Take 300 mg by mouth in the morning. 07/21/14  Yes [provider]  inFLIXimab  (REMICADE ) 100 MG injection Inject into the vein every 8 (eight) weeks.   Yes [provider]  Metoprolol  Tartrate 75 MG TABS Take 2 tablets (150 mg total) by mouth 2 (two) times daily. 11/19/24  Yes Tolia, Sunit, DO  ondansetron  (ZOFRAN ) 4 MG tablet Take 1 tablet (4 mg total) by mouth every 8 (eight) hours as needed for nausea or vomiting. 09/23/24  Yes [name], MD  predniSONE  (DELTASONE ) 5 MG tablet Take 5 mg by mouth daily with breakfast. 09/22/21  Yes [provider]  Tiotropium Bromide-Olodaterol (STIOLTO RESPIMAT ) 2.5-2.5 MCG/ACT AERS Inhale 2 puffs into the lungs daily. 05/21/24  Yes Cobb, Comer GAILS, NP  furosemide  (LASIX ) 20 MG tablet Take 1 tablet (20 mg total) by mouth daily. Patient not taking: Reported on 01/12/2025 05/21/22  01/12/25  Antoinette Doe, MD  nitrofurantoin , macrocrystal-monohydrate, (MACROBID ) 100 MG capsule Take 1 capsule (100 mg total) by mouth 2 (two) times daily. Patient not taking: Reported on 01/12/2025 11/25/24   Boscia, Heather E, NP    Scheduled Meds:  arformoterol   15 mcg Nebulization BID   And   umeclidinium bromide   1 puff Inhalation Daily   gabapentin   300 mg Oral Daily   pantoprazole  (PROTONIX ) IV  40 mg Intravenous Q24H   predniSONE   5 mg Oral Q breakfast   Continuous Infusions:  ceFEPime  (MAXIPIME ) IV Stopped (01/13/25 9351)   diltiazem  (CARDIZEM ) infusion 5 mg/hr (01/13/25 1049)   lactated ringers  75 mL/hr at 01/12/25 2048   metronidazole  Stopped (01/13/25 0431)   PRN Meds: acetaminophen  **OR** acetaminophen , morphine  injection, ondansetron  **OR** ondansetron  (ZOFRAN ) IV  Allergies:   Allergies[1]  Social History:   Social History   Socioeconomic History   Marital status: Married    Spouse name: Not on  file   Number of children: Not on file   Years of education: Not on file   Highest education level: Not on file  Occupational History   Not on file  Tobacco Use   Smoking status: Former    Current packs/day: 0.00    Average packs/day: 1 pack/day for 30.0 years (30.0 ttl pk-yrs)    Types: Cigarettes    Start date: 4    Quit date: 2005    Years since quitting: 21.0   Smokeless tobacco: Never  Vaping Use   Vaping status: Never Used  Substance and Sexual Activity   Alcohol use: Yes    Alcohol/week: 12.0 - 14.0 standard drinks of alcohol    Types: 12 - 14 Glasses of wine per week   Drug use: No   Sexual activity: Not Currently  Other Topics Concern   Not on file  Social History Narrative   Not on file   Social Drivers of Health   Tobacco Use: Medium Risk (01/12/2025)   Patient History    Smoking Tobacco Use: Former    Smokeless Tobacco Use: Never    Passive Exposure: Not on Actuary Strain: Not on file  Food Insecurity: No Food  Insecurity (12/15/2024)   ACO Reach    Worried About Running Out of Food in the Last Year: No    Ran Out of Food in the Last Year: No  Transportation Needs: No Transportation Needs (12/15/2024)   ACO Reach    Lack of Transportation: No  Physical Activity: Not on file  Stress: Not on file  Social Connections: Not on file  Intimate Partner Violence: At Risk (12/15/2024)   ACO Reach    Feels Physically and Emotionally Safe: No    Physically Hurt by Someone: No    Humiliated or Emotionally Abused by Someone: No  Depression (PHQ2-9): Low Risk (12/22/2024)   Depression (PHQ2-9)    PHQ-2 Score: 0  Alcohol Screen: Not on file  Housing: At Risk (12/15/2024)   ACO Reach    Has Housing: No    Worried About Losing Housing: No    Unable to Get Utilities: No  Utilities: At Risk (12/15/2024)   ACO Reach    Has Housing: No    Worried About Losing Housing: No    Unable to Get Utilities: No  Health Literacy: Not on file    Family History:    Family History  Problem Relation Age of Onset   Peripheral Artery Disease Father    Diabetes Father      ROS:  Please see the history of present illness.   All other ROS reviewed and negative.     Physical Exam/Data: Vitals:   01/13/25 0930 01/13/25 0935 01/13/25 1010 01/13/25 1048  BP: 107/71 97/61 99/67  (!) 108/59  Pulse: (!) 113     Resp: (!) 21     Temp:      TempSrc:      SpO2: 92%     Weight:      Height:        Intake/Output Summary (Last 24 hours) at 01/13/2025 1122 Last data filed at 01/13/2025 0648 Gross per 24 hour  Intake 297.92 ml  Output --  Net 297.92 ml      01/12/2025    9:08 PM 12/22/2024    1:36 PM 12/15/2024   11:37 AM  Last 3 Weights  Weight (lbs) 105 lb 9.6 oz 105 lb 8 oz 103 lb 12.8 oz  Weight (kg)  47.9 kg 47.854 kg 47.083 kg     Body mass index is 18.71 kg/m.  General: Frail appearing elderly female, well developed, in no acute distress.  Alert and orientated on room air. HEENT: normal Neck: no  JVD Vascular: No carotid bruits; Distal pulses 2+ bilaterally Cardiac:  normal S1, S2; irregular rhythm; no murmur  Lungs:  clear to auscultation bilaterally, no wheezing, rhonchi or rales  Abd: Abdomen is tender in her right lower quadrant. Ext: no edema Musculoskeletal:  No deformities Skin: warm and dry  Neuro:  no focal abnormalities noted Psych:  Normal affect   EKG:  The EKG was personally reviewed and demonstrates:  atrial flutter with a ventricular rate of 112 and variable AV node conduction. Telemetry:  Telemetry was personally reviewed and demonstrates: Atrial flutter with variable AV conduction and ventricular heart rates in the 160s to 120s.   Relevant CV Studies: Echo pending  Laboratory Data: High Sensitivity Troponin:  No results for input(s): TROPONINIHS in the last 720 hours. No results for input(s): TRNPT in the last 720 hours.    Chemistry Recent Labs  Lab 01/12/25 1123 01/12/25 2349 01/13/25 0615  NA 133*  --  134*  K 3.9  --  4.2  CL 97*  --  105  CO2 23  --  21*  GLUCOSE 124*  --  73  BUN 21  --  16  CREATININE 1.07*  --  0.95  CALCIUM  8.1*  --  6.5*  MG  --  2.4  --   GFRNONAA 53*  --  >60  ANIONGAP 13  --  8    Recent Labs  Lab 01/12/25 1123 01/13/25 0615  PROT 6.3* 4.9*  ALBUMIN 3.5 2.8*  AST 26 22  ALT 12 10  ALKPHOS 105 80  BILITOT 0.6 0.4   Lipids No results for input(s): CHOL, TRIG, HDL, LABVLDL, LDLCALC, CHOLHDL in the last 168 hours.  Hematology Recent Labs  Lab 01/12/25 1123 01/13/25 0615  WBC 84.9* 72.3*  RBC 3.10* 2.75*  HGB 10.4* 9.1*  HCT 32.5* 29.8*  MCV 104.8* 108.4*  MCH 33.5 33.1  MCHC 32.0 30.5  RDW 17.2* 17.2*  PLT 172 140*   Thyroid  No results for input(s): TSH, FREET4 in the last 168 hours.  BNPNo results for input(s): BNP, PROBNP in the last 168 hours.  DDimer No results for input(s): DDIMER in the last 168 hours.  Radiology/Studies:  CT ABDOMEN PELVIS W CONTRAST Result Date:  01/12/2025 EXAM: CT ABDOMEN AND PELVIS WITH CONTRAST 01/12/2025 12:46:02 PM TECHNIQUE: CT of the abdomen and pelvis was performed with the administration of 100 mL of iohexol  (OMNIPAQUE ) 300 MG/ML solution. Multiplanar reformatted images are provided for review. Automated exposure control, iterative reconstruction, and/or weight-based adjustment of the mA/kV was utilized to reduce the radiation dose to as low as reasonably achievable. COMPARISON: 11/12/2023 CLINICAL HISTORY: Nausea, vomiting, and weakness for 3 days. History of chronic myelomonocytic leukemia. * Tracking Code: BO * FINDINGS: LOWER CHEST: Stable appearance of lower thoracic paraspinal and bilateral posterior pleural based masses, the posterior pleural based mass is a short axis diameter of 1.3 cm, previously 1.2 cm. Small bilateral pleural effusions with passive atelectasis. Cardiomegaly noted with mitral valve calcifications. Emphysema. LIVER: Scattered hypodense hepatic lesions favoring cysts, the largest is in the lateral segment left hepatic lobe with fluid density and measuring 1.5 cm in long axis. GALLBLADDER AND BILE DUCTS: The dependent density in the gallbladder suspicious for small gallstone, image 28 series 2. 5  mm linear calcification posteriorly in the distal common bile duct suspicious for choledocholithiasis, image 33 series 2 and image 80 series 10. The common bile duct measures 6 mm in diameter which is within normal limits and similar to prior. No intrahepatic biliary dilatation. SPLEEN: No acute abnormality. PANCREAS: No acute abnormality. ADRENAL GLANDS: No acute abnormality. KIDNEYS, URETERS AND BLADDER: In addition to bilateral Bosniak category 1 renal cysts there are multiple hypodense renal lesions which are likely cysts but are technically too small to characterize. These lesions do not require further imaging workup. No stones in the kidneys or ureters. No hydronephrosis. No perinephric or periureteral stranding. Urinary  bladder is unremarkable. GI AND BOWEL: Abnormal pericecal collection in the expected vicinity of the appendix measuring 4.8 x 2.7 x 2.6 cm, not readily separable from the appendix, suspicious for appendicitis possibly with contained periappendiceal abscess. Appendiceal mass is a less likely differential diagnostic consideration given the unremarkable appearance of the appendix on 11/12/2023. Correlate with the patient's symptoms. There is a mild stranding around this lesion indicating inflammation. Sigmoid colon diverticulosis. Scattered diverticula elsewhere in the descending colon. Stomach demonstrates no acute abnormality. There is no bowel obstruction. PERITONEUM AND RETROPERITONEUM: No ascites. No free air. VASCULATURE: Systemic atherosclerosis is present, including the aorta and iliac arteries. Aorta is normal in caliber. LYMPH NODES: No lymphadenopathy. REPRODUCTIVE ORGANS: No acute abnormality. 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 Sclerosis in the sacrum likely attributable to remote sacral insufficiency fracture. Extra convex lower thoracic and upper lumbar scoliosis. Right foraminal impingement of L4-L5 and L5-S1 primarily from spondylosis. IMPRESSION: 1. Abnormal pericecal collection inseparable from the appendix, suspicious for acute appendicitis with contained periappendiceal abscess; surgical consultation is recommended. 2. Linear calcification in the distal common bile duct suspicious for choledocholithiasis. 3. Cholelithiasis. 4. Similar appearance of presacral fatty masses, favored extramedullary hematopoiesis. 5. Stable lower thoracic paraspinal and bilateral posterior pleural-based masses, likewise favoring extramedullary hematopoiesis. 6. Small  bilateral pleural effusions with passive atelectasis. 7. Emphysema. 8. Cardiomegaly with mitral valve calcifications. 9. Sigmoid colon diverticulosis. 10. Systemic atherosclerosis. 11. Sclerosis in the sacrum likely from remote sacral insufficiency fracture. 12. Lower thoracic and upper lumbar scoliosis. 13. Spondylosis with right foraminal impingement at L4-5 and L5-S1. Electronically signed by: Ryan Salvage MD 01/12/2025 02:30 PM EST RP Workstation: [id number]     Assessment and Plan: NOMI RUDNICKI is a 79 y.o. 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   Acute appendicitis Periappendiceal abscess Patient presented to the hospital for concerns of nausea and vomiting.  CT abdomen and pelvis was done and indicated that the patient had acute appendicitis with a periappendiceal abscess.  Surgery was consulted.  Surgery is currently favoring a conservative management approach but is continuing to follow the patient.  The patient was made NPO. Management per surgery and primary  Longstanding persistent/permanent atrial flutter CHA2DS2-VASc Score = 6 [CHF History: 1, HTN History: 1, Diabetes History: 0, Stroke History: 0, Vascular Disease History: 1, Age Score: 2, Gender Score: 1].  Therefore, the patient's annual risk of stroke is 9.7 %.    Patient was initially diagnosed with atrial fibrillation back in 2023.  Reported that her heart rate typically runs in the 110s and 120s at home.  Prior to admission the patient was on Eliquis  5 mg twice daily, metoprolol  titrate 75 mg twice daily, and Cardizem  360 mg daily.  Discussed with patient and she prefers a rate control strategy over a rhythm control strategy.  Suspect that the patient's increased heart rates are secondary to acute  appendicitis. EKG showed atrial flutter with a ventricular rate of 112 and variable AV node conduction. Patient was started on IV Cardizem  and heart rates have improved slightly but remain elevated in the 130s. Labs showed a creatinine of 0.95, potassium of 4.2, and magnesium  of 2.4. Order TSH Continue IV Cardizem  May consider starting IV digoxin . Will avoid metoprolol  for now given that patient is NPO.   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  Does not appear like patient has previously had an ischemic evaluation. An echocardiogram was done on 03/2022 and showed a moderately reduced LVEF of 40 to 45%. Patient denies any symptoms of heart failure such as shortness of breath, orthopnea, lower extremity edema, PND. CT abdomen and pelvis showed  small bilateral pleural effusions, cardiomegaly with mitral valve calcifications, and systemic atherosclerosis. 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 The patient's score is based upon: CHF History: 1 HTN History: 1 Diabetes History: 0 Stroke History: 0 Vascular Disease History: 1 Age Score: 2 Gender Score: 1        For questions or updates, please contact [facility] Please consult [email or web address] for contact info under     Signed, Morse Clause, PA-C  01/13/2025 11:22 AM     [1]  Allergies Allergen Reactions   Digoxin  And Related Rash and Other (See Comments)    Generalized total body rash with immense itching   Gluten Meal Diarrhea   Penicillins Rash and Other (See Comments)    Did it involve sudden or severe rash/hives, skin peeling, or any reaction on the inside of your mouth or nose? Yes When did it last happen? Many Years Ago       Fexofenadine Other (See Comments)    Dry mouth   Alendronate Rash   Hydroxychloroquine Rash   "

## 2025-01-14 ENCOUNTER — Inpatient Hospital Stay (HOSPITAL_COMMUNITY)

## 2025-01-14 ENCOUNTER — Encounter: Payer: Self-pay | Admitting: Internal Medicine

## 2025-01-14 DIAGNOSIS — I5022 Chronic systolic (congestive) heart failure: Secondary | ICD-10-CM | POA: Diagnosis not present

## 2025-01-14 DIAGNOSIS — K3533 Acute appendicitis with perforation and localized peritonitis, with abscess: Secondary | ICD-10-CM | POA: Diagnosis not present

## 2025-01-14 DIAGNOSIS — C9310 Chronic myelomonocytic leukemia not having achieved remission: Secondary | ICD-10-CM | POA: Diagnosis not present

## 2025-01-14 DIAGNOSIS — I482 Chronic atrial fibrillation, unspecified: Secondary | ICD-10-CM | POA: Diagnosis not present

## 2025-01-14 DIAGNOSIS — J4489 Other specified chronic obstructive pulmonary disease: Secondary | ICD-10-CM | POA: Diagnosis not present

## 2025-01-14 LAB — COMPREHENSIVE METABOLIC PANEL WITH GFR
ALT: 10 U/L (ref 0–44)
AST: 19 U/L (ref 15–41)
Albumin: 2.9 g/dL — ABNORMAL LOW (ref 3.5–5.0)
Alkaline Phosphatase: 86 U/L (ref 38–126)
Anion gap: 7 (ref 5–15)
BUN: 17 mg/dL (ref 8–23)
CO2: 24 mmol/L (ref 22–32)
Calcium: 6.8 mg/dL — ABNORMAL LOW (ref 8.9–10.3)
Chloride: 105 mmol/L (ref 98–111)
Creatinine, Ser: 0.98 mg/dL (ref 0.44–1.00)
GFR, Estimated: 59 mL/min — ABNORMAL LOW
Glucose, Bld: 100 mg/dL — ABNORMAL HIGH (ref 70–99)
Potassium: 3.8 mmol/L (ref 3.5–5.1)
Sodium: 136 mmol/L (ref 135–145)
Total Bilirubin: 0.4 mg/dL (ref 0.0–1.2)
Total Protein: 5 g/dL — ABNORMAL LOW (ref 6.5–8.1)

## 2025-01-14 LAB — CBC WITH DIFFERENTIAL/PLATELET
Abs Immature Granulocytes: 0.7 K/uL — ABNORMAL HIGH (ref 0.00–0.07)
Band Neutrophils: 2 %
Basophils Absolute: 0.7 K/uL — ABNORMAL HIGH (ref 0.0–0.1)
Basophils Relative: 1 %
Eosinophils Absolute: 0 K/uL (ref 0.0–0.5)
Eosinophils Relative: 0 %
HCT: 26.8 % — ABNORMAL LOW (ref 36.0–46.0)
Hemoglobin: 8.6 g/dL — ABNORMAL LOW (ref 12.0–15.0)
Lymphocytes Relative: 30 %
Lymphs Abs: 21.1 K/uL — ABNORMAL HIGH (ref 0.7–4.0)
MCH: 33.9 pg (ref 26.0–34.0)
MCHC: 32.1 g/dL (ref 30.0–36.0)
MCV: 105.5 fL — ABNORMAL HIGH (ref 80.0–100.0)
Monocytes Absolute: 2.1 K/uL — ABNORMAL HIGH (ref 0.1–1.0)
Monocytes Relative: 3 %
Myelocytes: 1 %
Neutro Abs: 44.3 K/uL — ABNORMAL HIGH (ref 1.7–7.7)
Neutrophils Relative %: 61 %
Other: 2 %
Platelets: 149 K/uL — ABNORMAL LOW (ref 150–400)
RBC: 2.54 MIL/uL — ABNORMAL LOW (ref 3.87–5.11)
RDW: 16.9 % — ABNORMAL HIGH (ref 11.5–15.5)
Smear Review: NORMAL
WBC: 70.3 K/uL (ref 4.0–10.5)
nRBC: 0 % (ref 0.0–0.2)

## 2025-01-14 LAB — MAGNESIUM: Magnesium: 2.2 mg/dL (ref 1.7–2.4)

## 2025-01-14 LAB — PHOSPHORUS: Phosphorus: 2.5 mg/dL (ref 2.5–4.6)

## 2025-01-14 LAB — PATHOLOGIST SMEAR REVIEW

## 2025-01-14 MED ORDER — METOPROLOL TARTRATE 25 MG PO TABS
25.0000 mg | ORAL_TABLET | Freq: Two times a day (BID) | ORAL | Status: DC
  Administered 2025-01-14 (×2): 25 mg via ORAL
  Filled 2025-01-14 (×2): qty 1

## 2025-01-14 MED ORDER — IOHEXOL 300 MG/ML  SOLN
80.0000 mL | Freq: Once | INTRAMUSCULAR | Status: AC | PRN
  Administered 2025-01-14: 80 mL via INTRAVENOUS

## 2025-01-14 MED ORDER — POTASSIUM CHLORIDE CRYS ER 20 MEQ PO TBCR
20.0000 meq | EXTENDED_RELEASE_TABLET | Freq: Once | ORAL | Status: AC
  Administered 2025-01-14: 20 meq via ORAL
  Filled 2025-01-14: qty 1

## 2025-01-14 MED ORDER — IOHEXOL 9 MG/ML PO SOLN
1000.0000 mL | ORAL | Status: AC
  Administered 2025-01-14: 1000 mL via ORAL

## 2025-01-14 NOTE — Progress Notes (Signed)
 Patient does not prefer oxygen via [place] to support oxygen demands. Pt encouraged complete deep breathing exercises.   01/14/25 0900  Vitals  BP 94/65  MAP (mmHg) 74  BP Location Left Arm  BP Method Automatic  Patient Position (if appropriate) Lying  Pulse Rate (!) 149  ECG Heart Rate (!) 140  Resp 19  MEWS COLOR  MEWS Score Color Red  Oxygen Therapy  SpO2 92 %  O2 Device Room Air  MEWS Score  MEWS Temp 0  MEWS Systolic 1  MEWS Pulse 3  MEWS RR 0  MEWS LOC 0  MEWS Score 4

## 2025-01-14 NOTE — Progress Notes (Addendum)
 "  Progress Note     Subjective: Notes some more right lower quadrant pain today.  ROS  All negative with the exception of above.  Objective: Vital signs in last 24 hours: Temp:  [98.2 F (36.8 C)-100 F (37.8 C)] 98.4 F (36.9 C) (01/21 0300) Pulse Rate:  [96-163] 133 (01/21 0700) Resp:  [12-33] 20 (01/21 0700) BP: (80-110)/(40-75) 107/62 (01/21 0700) SpO2:  [91 %-100 %] 96 % (01/21 0700) Weight:  [47.7 kg] 47.7 kg (01/20 1936) Last BM Date :  (PTA)  Intake/Output from previous day: 01/20 0701 - 01/21 0700 In: 501 [I.V.:134.9; IV Piggyback:366.1] Out: 700 [Urine:700] Intake/Output this shift: No intake/output data recorded.  PE: General: Pleasant chronically ill appearing female who is laying in bed in NAD. Chest:  tachy/ af rvr; Respiratory effort nonlabored  Abd: Soft with minimal distention. Mild tenderness to palpation of RLQ.  No peritoneal signs Skin: Warm and dry.  Psych: A&Ox3 with an appropriate affect.    Lab Results:  Recent Labs    01/13/25 0615 01/14/25 0304  WBC 72.3* 70.3*  HGB 9.1* 8.6*  HCT 29.8* 26.8*  PLT 140* 149*   BMET Recent Labs    01/13/25 0615 01/14/25 0304  NA 134* 136  K 4.2 3.8  CL 105 105  CO2 21* 24  GLUCOSE 73 100*  BUN 16 17  CREATININE 0.95 0.98  CALCIUM  6.5* 6.8*   PT/INR Recent Labs    01/12/25 1123  LABPROT 20.6*  INR 1.7*   CMP     Component Value Date/Time   NA 136 01/14/2025 0304   NA 141 01/04/2016 1001   K 3.8 01/14/2025 0304   K 4.5 01/04/2016 1001   CL 105 01/14/2025 0304   CO2 24 01/14/2025 0304   CO2 24 01/04/2016 1001   GLUCOSE 100 (H) 01/14/2025 0304   GLUCOSE 105 01/04/2016 1001   BUN 17 01/14/2025 0304   BUN 21.7 01/04/2016 1001   CREATININE 0.98 01/14/2025 0304   CREATININE 1.48 (H) 12/15/2024 1057   CREATININE 1.2 (H) 01/04/2016 1001   CALCIUM  6.8 (L) 01/14/2025 0304   CALCIUM  9.7 01/04/2016 1001   PROT 5.0 (L) 01/14/2025 0304   PROT 7.0 01/04/2016 1001   ALBUMIN 2.9 (L)  01/14/2025 0304   ALBUMIN 4.0 01/04/2016 1001   AST 19 01/14/2025 0304   AST 21 12/21/2023 1359   AST 26 01/04/2016 1001   ALT 10 01/14/2025 0304   ALT 14 12/21/2023 1359   ALT 24 01/04/2016 1001   ALKPHOS 86 01/14/2025 0304   ALKPHOS 61 01/04/2016 1001   BILITOT 0.4 01/14/2025 0304   BILITOT 0.8 12/21/2023 1359   BILITOT 0.41 01/04/2016 1001   GFRNONAA 59 (L) 01/14/2025 0304   GFRNONAA 36 (L) 12/15/2024 1057   GFRAA 56 (L) 01/09/2020 1339   Lipase     Component Value Date/Time   LIPASE 26 01/12/2025 1123       Studies/Results: CT ABDOMEN PELVIS W CONTRAST Result Date: 01/12/2025 EXAM: CT ABDOMEN AND PELVIS WITH CONTRAST 01/12/2025 12:46:02 PM TECHNIQUE: CT of the abdomen and pelvis was performed with the administration of 100 mL of iohexol  (OMNIPAQUE ) 300 MG/ML solution. Multiplanar reformatted images are provided for review. Automated exposure control, iterative reconstruction, and/or weight-based adjustment of the mA/kV was utilized to reduce the radiation dose to as low as reasonably achievable. COMPARISON: 11/12/2023 CLINICAL HISTORY: Nausea, vomiting, and weakness for 3 days. History of chronic myelomonocytic leukemia. * Tracking Code: BO * FINDINGS: LOWER CHEST: Stable appearance  of lower thoracic paraspinal and bilateral posterior pleural based masses, the posterior pleural based mass is a short axis diameter of 1.3 cm, previously 1.2 cm. Small bilateral pleural effusions with passive atelectasis. Cardiomegaly noted with mitral valve calcifications. Emphysema. LIVER: Scattered hypodense hepatic lesions favoring cysts, the largest is in the lateral segment left hepatic lobe with fluid density and measuring 1.5 cm in long axis. GALLBLADDER AND BILE DUCTS: The dependent density in the gallbladder suspicious for small gallstone, image 28 series 2. 5 mm linear calcification posteriorly in the distal common bile duct suspicious for choledocholithiasis, image 33 series 2 and image 80  series 10. The common bile duct measures 6 mm in diameter which is within normal limits and similar to prior. No intrahepatic biliary dilatation. SPLEEN: No acute abnormality. PANCREAS: No acute abnormality. ADRENAL GLANDS: No acute abnormality. KIDNEYS, URETERS AND BLADDER: In addition to bilateral Bosniak category 1 renal cysts there are multiple hypodense renal lesions which are likely cysts but are technically too small to characterize. These lesions do not require further imaging workup. No stones in the kidneys or ureters. No hydronephrosis. No perinephric or periureteral stranding. Urinary bladder is unremarkable. GI AND BOWEL: Abnormal pericecal collection in the expected vicinity of the appendix measuring 4.8 x 2.7 x 2.6 cm, not readily separable from the appendix, suspicious for appendicitis possibly with contained periappendiceal abscess. Appendiceal mass is a less likely differential diagnostic consideration given the unremarkable appearance of the appendix on 11/12/2023. Correlate with the patient's symptoms. There is a mild stranding around this lesion indicating inflammation. Sigmoid colon diverticulosis. Scattered diverticula elsewhere in the descending colon. Stomach demonstrates no acute abnormality. There is no bowel obstruction. PERITONEUM AND RETROPERITONEUM: No ascites. No free air. VASCULATURE: Systemic atherosclerosis is present, including the aorta and iliac arteries. Aorta is normal in caliber. LYMPH NODES: No lymphadenopathy. REPRODUCTIVE ORGANS: No acute abnormality. 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 Sclerosis in the sacrum likely attributable to remote sacral insufficiency fracture. Extra convex lower thoracic and upper lumbar  scoliosis. Right foraminal impingement of L4-L5 and L5-S1 primarily from spondylosis. IMPRESSION: 1. Abnormal pericecal collection inseparable from the appendix, suspicious for acute appendicitis with contained periappendiceal abscess; surgical consultation is recommended. 2. Linear calcification in the distal common bile duct suspicious for choledocholithiasis. 3. Cholelithiasis. 4. Similar appearance of presacral fatty masses, favored extramedullary hematopoiesis. 5. Stable lower thoracic paraspinal and bilateral posterior pleural-based masses, likewise favoring extramedullary hematopoiesis. 6. Small bilateral pleural effusions with passive atelectasis. 7. Emphysema. 8. Cardiomegaly with mitral valve calcifications. 9. Sigmoid colon diverticulosis. 10. Systemic atherosclerosis. 11. Sclerosis in the sacrum likely from remote sacral insufficiency fracture. 12. Lower thoracic and upper lumbar scoliosis. 13. Spondylosis with right foraminal impingement at L4-5 and L5-S1. Electronically signed by: Ryan Salvage MD 01/12/2025 02:30 PM EST RP Workstation: [id number]    Anti-infectives: Anti-infectives (From admission, onward)    Start     Dose/Rate Route Frequency Ordered Stop   01/14/25 0600  ceFEPIme  (MAXIPIME ) 2 g in sodium chloride  0.9 % 100 mL IVPB        2 g 200 mL/hr over 30 Minutes Intravenous Every 12 hours 01/13/25 1629     01/12/25 1830  metroNIDAZOLE  (FLAGYL ) IVPB 500 mg        500 mg 100 mL/hr over 60 Minutes Intravenous Every 12 hours 01/12/25 1822  01/12/25 1830  ceFEPIme  (MAXIPIME ) 2 g in sodium chloride  0.9 % 100 mL IVPB  Status:  Discontinued        2 g 200 mL/hr over 30 Minutes Intravenous Every 8 hours 01/12/25 1825 01/13/25 1629   01/12/25 1445  ceFEPIme  (MAXIPIME ) 2 g in sodium chloride  0.9 % 100 mL IVPB       Placed in And Linked Group   2 g 200 mL/hr over 30 Minutes Intravenous  Once 01/12/25 1436 01/12/25 1533   01/12/25 1445  metroNIDAZOLE  (FLAGYL ) IVPB 500 mg        Placed in And Linked Group   500 mg 100 mL/hr over 60 Minutes Intravenous  Once 01/12/25 1436 01/12/25 1606        Assessment/Plan Acute appendicitis with periappendiceal abscess -Likely to have appendicitis with a periappendiceal abscess. Reviewed with IR and there is not air in this collection and location makes it a bit difficult to drain as well. -Afebrile, WBC 70.3 from 72.3 from 84.9; HGB slowly drifting now 8.6 -Continue with IV abx and will plan to repeat CT this afternoon to see if it is more amendable to intervention. -Given her multiple medical problems including recent chemo, daily steroid use, Eliquis , etc, we pan to continue to treat with conservative management at this time and avoid surgical intervention if able.   -If she fails conservative management, she may require surgical intervention for which she would certainly be higher risk.  -Okay for CLD. -Will continue to follow.   FEN - CLD; IVFs per primary team VTE - Hold Eliquis , hold addition of heparin  gtt due to melena ID - Cefipime/Flagyl    UGI Bleed/Melena/anemia - unclear etiology.  No history of ulcer disease or NSAID use.  Her hgb appears stable at this time and she is HDS stable considering her Afib with RVR.  Will need GI eval  Choledocholithiasis - seems to be asymptomatic.  LFTs are normal indicating likely this to be nonocclusive.  Monitor LFTs.  At some point, will likely need this addressed, but as of now this is not the most acute problem. 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 WBC baseline prior to admission ranging from 25-47 CHF/ cardiomyopathy A fib, RVR - cardiology consulted, echo pending. Anticoagulation on hold for GI bleed COPD Lung nodules HTN RA - on Remicade  and Prednisone  (5mg  daily) Extramedullary hematopoiesis with large presacral tumor CKD PVD Physical deconditioning Malnutrition Celiac disease Alcohol use RLE pressure skin injury   LOS: 2  days   I reviewed specialist notes, hospitalist notes, consulting provider notes, nursing notes, last 24 h vitals and pain scores, last 48 h intake and output, last 24 h labs and trends, and last 24 h imaging results.  This care required high level of medical decision making.   Mitzie DELENA Freund, MD  [facility] 01/14/2025, 7:36 AM Please see Amion for pager number during day hours 7:00am-4:30pm  "

## 2025-01-14 NOTE — TOC Initial Note (Signed)
 Transition of Care [facility]) - Initial/Assessment Note    Patient Details  Name: [name] MRN: [id number] Date of Birth: 01-Aug-1946  [facility] CM/SW Contact:    Jon ONEIDA Anon, RN Phone Number: 01/14/2025, 3:35 PM  Clinical Narrative:                 Pt is from home. Came into the ED with complaints of N/V and generalized weakness for a few days. Pt admitted with diagnosis of Acute appendicitis with appendiceal abscess. Pt in Afib, being followed by Cardiology at this time. Surgery is following. PT/OT has been consulted, awaiting recommendations. Pt is in the [employer] program, established with [facility]. ICM will follow for DC planning needs.     Expected Discharge Plan:  (TBD) Barriers to Discharge: Continued Medical Work up   Patient Goals and CMS Choice Patient states their goals for this hospitalization and ongoing recovery are:: Home CMS Medicare.gov Compare Post Acute Care list provided to:: Patient Choice offered to / list presented to : Patient [place] ownership interest in [facility].provided to:: Patient    Expected Discharge Plan and Services In-house Referral: NA Discharge Planning Services: CM Consult Post Acute Care Choice: Durable Medical Equipment Living arrangements for the past 2 months: Single Family Home                 DME Arranged: N/A DME Agency: NA       HH Arranged: NA HH Agency: NA        Prior Living Arrangements/Services Living arrangements for the past 2 months: Single Family Home Lives with:: Relatives Patient language and need for interpreter reviewed:: Yes Do you feel safe going back to the place where you live?: Yes      Need for Family Participation in Patient Care: Yes (Comment) Care giver support system in place?: Yes (comment) Current home services: DME Criminal Activity/Legal Involvement Pertinent to Current Situation/Hospitalization: No - Comment as  needed  Activities of Daily Living   ADL Screening (condition at time of admission) Independently performs ADLs?: Yes (appropriate for developmental age) Is the patient deaf or have difficulty hearing?: No Does the patient have difficulty seeing, even when wearing glasses/contacts?: Yes Does the patient have difficulty concentrating, remembering, or making decisions?: No  Permission Sought/Granted Permission sought to share information with : Family Supports, Magazine Features Editor    Share Information with NAME: Avriel, Kandel, Emergency Contact  [phone]  Permission granted to share info w AGENCY: [employer]        Emotional Assessment Appearance:: Other (Comment Required (UTA) Attitude/Demeanor/Rapport: Unable to Assess Affect (typically observed): Unable to Assess   Alcohol / Substance Use: Not Applicable Psych Involvement: No (comment)  Admission diagnosis:  Melena [K92.1] Appendicitis [K37] Right lower quadrant abdominal pain [R10.31] Acute appendicitis with appendiceal abscess [K35.33] Atrial fibrillation, unspecified type (HCC) [I48.91] Acute appendicitis with localized peritonitis and abscess, unspecified whether gangrene present, unspecified whether perforation present [K35.33] Patient Active Problem List   Diagnosis Date Noted   Acute appendicitis with appendiceal abscess 01/13/2025   Appendicitis 01/12/2025   Lung nodules 04/23/2024   Hyponatremia 11/13/2023   Sacral pain 11/13/2023   Normocytic anemia 11/13/2023   Celiac disease 10/05/2022   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   Gastro-esophageal reflux disease without esophagitis 10/05/2022   Pure hypercholesterolemia 10/05/2022   Acute urinary retention 06/20/2022   Arterial vascular disease 06/20/2022   Atrial fibrillation with rapid ventricular response (HCC) 06/19/2022   Acute  respiratory failure with hypoxia (HCC) 05/19/2022   Rheumatoid arthritis (HCC) 05/19/2022    Closed fracture of metatarsal bone of left foot 04/21/2022   Hematoma of left foot 04/08/2022   Malnutrition of moderate degree 04/06/2022   Chronic systolic CHF (congestive heart failure) (HCC) 04/05/2022   Dyspnea on exertion 04/05/2022   Hypokalemia 04/05/2022   Physical deconditioning 04/05/2022   Chronic kidney disease, stage 3a (HCC) 04/05/2022   Constipation 04/05/2022   Atrial fibrillation with slow ventricular response (HCC) 03/29/2022   Multifocal pneumonia 03/29/2022   Pancytopenia (HCC) 03/29/2022   Bilateral pleural effusion 03/29/2022   Port-A-Cath in place 02/06/2022   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   Stage 2 moderate COPD by GOLD classification (HCC) 05/03/2021   Hx of multiple pulmonary nodules 05/03/2021   S/P shoulder replacement, 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   Thrombocytopenia 02/04/2015   PCP:  [name], MD Pharmacy:   [facility] [phone] - [place], [place] - [address] AT [facility] OF [address] [place] [address] Phone: [phone] Fax: [phone]     Social Drivers of Health (SDOH) Social History: SDOH Screenings   Food Insecurity: No Food Insecurity (01/13/2025)  Housing: Low Risk (01/13/2025)  Recent Concern: Housing - At Risk (12/15/2024)  Transportation Needs: No Transportation Needs (01/13/2025)  Utilities: Not At Risk (01/13/2025)  Recent Concern: Utilities - At Risk (12/15/2024)  Depression (PHQ2-9): Low Risk (12/22/2024)  Social Connections: Unknown (01/13/2025)  Tobacco Use: Medium Risk (01/12/2025)   SDOH Interventions:     Readmission Risk Interventions    01/14/2025    3:31 PM 06/23/2022   11:27 AM  Readmission Risk Prevention Plan  Transportation Screening Complete Complete  PCP or Specialist Appt within 3-5 Days Complete   HRI or Home Care Consult Complete   Social Work Consult for Recovery Care Planning/Counseling Complete   Palliative Care Screening Not  Applicable   Medication Review Oceanographer) Complete Complete  PCP or Specialist appointment within 3-5 days of discharge  Complete  HRI or Home Care Consult  Complete  SW Recovery Care/Counseling Consult  Complete  Palliative Care Screening  Not Applicable  Skilled Nursing Facility  Not Applicable

## 2025-01-14 NOTE — Plan of Care (Signed)

## 2025-01-14 NOTE — Progress Notes (Signed)
 "   DAILY PROGRESS NOTE   Patient Name: [name] Date of Encounter: 01/14/2025 Cardiologist: Madonna Large, DO  Chief Complaint   Confused  Patient Profile   [name] is a 79 y.o. 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   Subjective   Remains in afib/flutter with RVR today- BP improved. On IV diltiazem  at 10 mg/hr.  Objective   Vitals:   01/14/25 0900 01/14/25 0926 01/14/25 1000 01/14/25 1100  BP: 94/65 (!) 127/53 100/62 (!) 108/58  Pulse: (!) 149 (!) 130 (!) 135   Resp: 19 20 16  (!) 21  Temp:      TempSrc:      SpO2: 92% 91% 95%   Weight:      Height:        Intake/Output Summary (Last 24 hours) at 01/14/2025 1115 Last data filed at 01/14/2025 0600 Gross per 24 hour  Intake 501.04 ml  Output 700 ml  Net -198.96 ml   Filed Weights   01/12/25 2108 01/13/25 1936  Weight: 47.9 kg 47.7 kg    Physical Exam   General appearance: alert and no distress Lungs: clear to auscultation bilaterally Heart: irregularly irregular rhythm Extremities: extremities normal, atraumatic, no cyanosis or edema Neurologic: Mental status: awake, but confused  Inpatient Medications    Scheduled Meds:  arformoterol   15 mcg Nebulization BID   And   umeclidinium bromide   1 puff Inhalation Daily   Chlorhexidine  Gluconate Cloth  6 each Topical Daily   gabapentin   300 mg Oral Daily   pantoprazole  (PROTONIX ) IV  40 mg Intravenous Q24H   predniSONE   5 mg Oral Q breakfast   sodium chloride  flush  10-40 mL Intracatheter Q12H    Continuous Infusions:  ceFEPime  (MAXIPIME ) IV Stopped (01/14/25 0548)   diltiazem  (CARDIZEM ) infusion 12.5 mg/hr (01/14/25 1112)   metronidazole  Stopped (01/14/25 0424)    PRN Meds: acetaminophen  **OR** acetaminophen , morphine   injection, ondansetron  **OR** ondansetron  (ZOFRAN ) IV, mouth rinse   Labs   Results for orders placed or performed during the hospital encounter of 01/12/25 (from the past 48 hours)  Lipase, blood     Status: None   Collection Time: 01/12/25 11:23 AM  Result Value Ref Range   Lipase 26 11 - 51 U/L    Comment: Performed at [facility], [address] [place], [place] [address]  Comprehensive metabolic panel     Status: Abnormal   Collection Time: 01/12/25 11:23 AM  Result Value Ref Range   Sodium 133 (L) 135 - 145 mmol/L   Potassium 3.9 3.5 - 5.1 mmol/L   Chloride 97 (L) 98 - 111 mmol/L   CO2 23 22 - 32 mmol/L   Glucose, Bld 124 (H) 70 - 99 mg/dL    Comment: Glucose reference range applies only to samples taken after fasting for at least 8 hours.   BUN 21 8 - 23 mg/dL   Creatinine, Ser 8.92 (H) 0.44 - 1.00 mg/dL   Calcium  8.1 (L) 8.9 - 10.3 mg/dL   Total Protein 6.3 (L) 6.5 - 8.1 g/dL   Albumin 3.5 3.5 - 5.0 g/dL   AST 26 15 - 41 U/L   ALT 12 0 - 44 U/L   Alkaline Phosphatase 105 38 - 126 U/L   Total Bilirubin 0.6 0.0 -  1.2 mg/dL   GFR, Estimated 53 (L) >60 mL/min    Comment: (NOTE) Calculated using the CKD-EPI Creatinine Equation (2021)    Anion gap 13 5 - 15    Comment: Performed at [facility], [address], [place], [place] [address]  CBC     Status: Abnormal   Collection Time: 01/12/25 11:23 AM  Result Value Ref Range   WBC 84.9 (HH) 4.0 - 10.5 K/uL    Comment: REPEATED TO VERIFY This critical result has been called to MILLER, A, RN by [phone] on 01/12/2025 11:53:21, and has been read back.    RBC 3.10 (L) 3.87 - 5.11 MIL/uL   Hemoglobin 10.4 (L) 12.0 - 15.0 g/dL   HCT 67.4 (L) 63.9 - 53.9 %   MCV 104.8 (H) 80.0 - 100.0 fL   MCH 33.5 26.0 - 34.0 pg   MCHC 32.0 30.0 - 36.0 g/dL   RDW 82.7 (H) 88.4 - 84.4 %   Platelets 172 150 - 400 K/uL   nRBC 0.1 0.0 - 0.2 %    Comment: Performed at [facility], [address] [place], [place] [address]  Protime-INR     Status: Abnormal   Collection Time: 01/12/25 11:23 AM  Result Value Ref Range   Prothrombin Time 20.6 (H) 11.4 - 15.2 seconds   INR 1.7 (H) 0.8 - 1.2    Comment: (NOTE) INR goal varies based on device and disease states. Performed at [facility], [address], [place], [place] [address]   APTT     Status: None   Collection Time: 01/12/25 11:23 AM  Result Value Ref Range   aPTT 33 24 - 36 seconds    Comment: Performed at [facility], [address], [place], [place] [address]  Urinalysis, Routine w reflex microscopic -Urine, Clean Catch     Status: Abnormal   Collection Time: 01/12/25  2:07 PM  Result Value Ref Range   Color, Urine YELLOW YELLOW   APPearance CLEAR CLEAR   Specific Gravity, Urine 1.029 1.005 - 1.030   pH 6.0 5.0 - 8.0   Glucose, UA NEGATIVE NEGATIVE mg/dL   Hgb urine dipstick SMALL (A) NEGATIVE   Bilirubin Urine NEGATIVE NEGATIVE   Ketones, ur NEGATIVE NEGATIVE mg/dL   Protein, ur NEGATIVE NEGATIVE mg/dL   Nitrite NEGATIVE NEGATIVE   Leukocytes,Ua NEGATIVE NEGATIVE   RBC / HPF 0-5 0 - 5 RBC/hpf   WBC, UA 0-5 0 - 5 WBC/hpf   Bacteria, UA RARE (A) NONE SEEN   Squamous Epithelial / HPF 0-5 0 - 5 /HPF   Mucus PRESENT     Comment: Performed at [facility], [address] [place], [place] [address]  Magnesium      Status: None   Collection Time: 01/12/25 11:49 PM  Result Value Ref Range   Magnesium  2.4 1.7 - 2.4 mg/dL    Comment: Performed at [facility], [address] [place], [place] [address]  Comprehensive metabolic panel     Status: Abnormal   Collection Time: 01/13/25  6:15 AM  Result Value Ref Range   Sodium 134 (L) 135 - 145 mmol/L   Potassium 4.2 3.5 - 5.1 mmol/L   Chloride 105 98 - 111 mmol/L   CO2 21 (L) 22 - 32 mmol/L   Glucose, Bld 73 70 - 99 mg/dL    Comment: Glucose reference range applies only to samples  taken after fasting for at least  8 hours.   BUN 16 8 - 23 mg/dL   Creatinine, Ser 9.04 0.44 - 1.00 mg/dL   Calcium  6.5 (L) 8.9 - 10.3 mg/dL   Total Protein 4.9 (L) 6.5 - 8.1 g/dL   Albumin  2.8 (L) 3.5 - 5.0 g/dL   AST 22 15 - 41 U/L   ALT 10 0 - 44 U/L   Alkaline Phosphatase 80 38 - 126 U/L   Total Bilirubin 0.4 0.0 - 1.2 mg/dL   GFR, Estimated >39 >39 mL/min    Comment: (NOTE) Calculated using the CKD-EPI Creatinine Equation (2021)    Anion gap 8 5 - 15    Comment: Performed at [facility], [address], [place], [place] [address]  CBC     Status: Abnormal   Collection Time: 01/13/25  6:15 AM  Result Value Ref Range   WBC 72.3 (HH) 4.0 - 10.5 K/uL    Comment: CRITICAL VALUE NOTED.  VALUE IS CONSISTENT WITH PREVIOUSLY REPORTED AND CALLED VALUE. REPEATED TO VERIFY    RBC 2.75 (L) 3.87 - 5.11 MIL/uL   Hemoglobin 9.1 (L) 12.0 - 15.0 g/dL   HCT 70.1 (L) 63.9 - 53.9 %   MCV 108.4 (H) 80.0 - 100.0 fL   MCH 33.1 26.0 - 34.0 pg   MCHC 30.5 30.0 - 36.0 g/dL   RDW 82.7 (H) 88.4 - 84.4 %   Platelets 140 (L) 150 - 400 K/uL   nRBC 0.0 0.0 - 0.2 %    Comment: Performed at [facility], [address], [place], [place] [address]  MRSA Next Gen by PCR, Nasal     Status: None   Collection Time: 01/13/25  7:03 PM   Specimen: Nasal Mucosa; Nasal Swab  Result Value Ref Range   MRSA by PCR Next Gen NOT DETECTED NOT DETECTED    Comment: (NOTE) The GeneXpert MRSA Assay (FDA approved for NASAL specimens only), is one component of a comprehensive MRSA colonization surveillance program. It is not intended to diagnose MRSA infection nor to guide or monitor treatment for MRSA infections. Test performance is not FDA approved in patients less than 14 years old. Performed at [facility], [address], [place], [place] [address]   Comprehensive metabolic panel with GFR     Status: Abnormal   Collection Time: 01/14/25  3:04 AM  Result  Value Ref Range   Sodium 136 135 - 145 mmol/L   Potassium 3.8 3.5 - 5.1 mmol/L   Chloride 105 98 - 111 mmol/L   CO2 24 22 - 32 mmol/L   Glucose, Bld 100 (H) 70 - 99 mg/dL    Comment: Glucose reference range applies only to samples taken after fasting for at least 8 hours.   BUN 17 8 - 23 mg/dL   Creatinine, Ser 9.01 0.44 - 1.00 mg/dL   Calcium  6.8 (L) 8.9 - 10.3 mg/dL   Total Protein 5.0 (L) 6.5 - 8.1 g/dL   Albumin  2.9 (L) 3.5 - 5.0 g/dL   AST 19 15 - 41 U/L   ALT 10 0 - 44 U/L   Alkaline Phosphatase 86 38 - 126 U/L   Total Bilirubin 0.4 0.0 - 1.2 mg/dL   GFR, Estimated 59 (L) >60 mL/min    Comment: (NOTE) Calculated using the CKD-EPI Creatinine Equation (2021)    Anion gap 7 5 - 15    Comment: Performed at [facility], [address], [place], [place] [address]  CBC with  Differential/Platelet     Status: Abnormal   Collection Time: 01/14/25  3:04 AM  Result Value Ref Range   WBC 70.3 (HH) 4.0 - 10.5 K/uL    Comment: CRITICAL VALUE NOTED.  VALUE IS CONSISTENT WITH PREVIOUSLY REPORTED AND CALLED VALUE. REPEATED TO VERIFY WHITE COUNT CONFIRMED ON SMEAR    RBC 2.54 (L) 3.87 - 5.11 MIL/uL   Hemoglobin 8.6 (L) 12.0 - 15.0 g/dL   HCT 73.1 (L) 63.9 - 53.9 %   MCV 105.5 (H) 80.0 - 100.0 fL   MCH 33.9 26.0 - 34.0 pg   MCHC 32.1 30.0 - 36.0 g/dL   RDW 83.0 (H) 88.4 - 84.4 %   Platelets 149 (L) 150 - 400 K/uL   nRBC 0.0 0.0 - 0.2 %   Neutrophils Relative % 61 %   Neutro Abs 44.3 (H) 1.7 - 7.7 K/uL   Band Neutrophils 2 %   Lymphocytes Relative 30 %   Lymphs Abs 21.1 (H) 0.7 - 4.0 K/uL   Monocytes Relative 3 %   Monocytes Absolute 2.1 (H) 0.1 - 1.0 K/uL   Eosinophils Relative 0 %   Eosinophils Absolute 0.0 0.0 - 0.5 K/uL   Basophils Relative 1 %   Basophils Absolute 0.7 (H) 0.0 - 0.1 K/uL   WBC Morphology See Note     Comment: Mild Left Shift (1-5% metas, occ myelo) Abnormal lymphocytes present    RBC Morphology MORPHOLOGY UNREMARKABLE    Smear Review  Normal platelet morphology    Other 2 %   Myelocytes 1 %   Abs Immature Granulocytes 0.70 (H) 0.00 - 0.07 K/uL    Comment: Performed at [facility], [address] [place], [place] [address]  Magnesium      Status: None   Collection Time: 01/14/25  3:04 AM  Result Value Ref Range   Magnesium  2.2 1.7 - 2.4 mg/dL    Comment: Performed at [facility], [address] [place], [place] [address]  Phosphorus     Status: None   Collection Time: 01/14/25  3:04 AM  Result Value Ref Range   Phosphorus 2.5 2.5 - 4.6 mg/dL    Comment: Performed at [facility], [address], [place], [place] [address]    ECG   N/A  Telemetry   Afib with RVR - Personally Reviewed  Radiology    CT ABDOMEN PELVIS W CONTRAST Result Date: 01/12/2025 EXAM: CT ABDOMEN AND PELVIS WITH CONTRAST 01/12/2025 12:46:02 PM TECHNIQUE: CT of the abdomen and pelvis was performed with the administration of 100 mL of iohexol  (OMNIPAQUE ) 300 MG/ML solution. Multiplanar reformatted images are provided for review. Automated exposure control, iterative reconstruction, and/or weight-based adjustment of the mA/kV was utilized to reduce the radiation dose to as low as reasonably achievable. COMPARISON: 11/12/2023 CLINICAL HISTORY: Nausea, vomiting, and weakness for 3 days. History of chronic myelomonocytic leukemia. * Tracking Code: BO * FINDINGS: LOWER CHEST: Stable appearance of lower thoracic paraspinal and bilateral posterior pleural based masses, the posterior pleural based mass is a short axis diameter of 1.3 cm, previously 1.2 cm. Small bilateral pleural effusions with passive atelectasis. Cardiomegaly noted with mitral valve calcifications. Emphysema. LIVER: Scattered hypodense hepatic lesions favoring cysts, the largest is in the lateral segment left hepatic lobe with fluid density and measuring 1.5 cm in long axis. GALLBLADDER AND BILE DUCTS: The dependent density in the  gallbladder suspicious for small gallstone, image 28 series 2. 5 mm linear calcification posteriorly in the distal common  bile duct suspicious for choledocholithiasis, image 33 series 2 and image 80 series 10. The common bile duct measures 6 mm in diameter which is within normal limits and similar to prior. No intrahepatic biliary dilatation. SPLEEN: No acute abnormality. PANCREAS: No acute abnormality. ADRENAL GLANDS: No acute abnormality. KIDNEYS, URETERS AND BLADDER: In addition to bilateral Bosniak category 1 renal cysts there are multiple hypodense renal lesions which are likely cysts but are technically too small to characterize. These lesions do not require further imaging workup. No stones in the kidneys or ureters. No hydronephrosis. No perinephric or periureteral stranding. Urinary bladder is unremarkable. GI AND BOWEL: Abnormal pericecal collection in the expected vicinity of the appendix measuring 4.8 x 2.7 x 2.6 cm, not readily separable from the appendix, suspicious for appendicitis possibly with contained periappendiceal abscess. Appendiceal mass is a less likely differential diagnostic consideration given the unremarkable appearance of the appendix on 11/12/2023. Correlate with the patient's symptoms. There is a mild stranding around this lesion indicating inflammation. Sigmoid colon diverticulosis. Scattered diverticula elsewhere in the descending colon. Stomach demonstrates no acute abnormality. There is no bowel obstruction. PERITONEUM AND RETROPERITONEUM: No ascites. No free air. VASCULATURE: Systemic atherosclerosis is present, including the aorta and iliac arteries. Aorta is normal in caliber. LYMPH NODES: No lymphadenopathy. REPRODUCTIVE ORGANS: No acute abnormality. 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 Sclerosis in the sacrum likely attributable to remote sacral insufficiency fracture. Extra convex lower thoracic and upper lumbar scoliosis. Right foraminal impingement of L4-L5 and L5-S1 primarily from spondylosis. IMPRESSION: 1. Abnormal pericecal collection inseparable from the appendix, suspicious for acute appendicitis with contained periappendiceal abscess; surgical consultation is recommended. 2. Linear calcification in the distal common bile duct suspicious for choledocholithiasis. 3. Cholelithiasis. 4. Similar appearance of presacral fatty masses, favored extramedullary hematopoiesis. 5. Stable lower thoracic paraspinal and bilateral posterior pleural-based masses, likewise favoring extramedullary hematopoiesis. 6. Small bilateral pleural effusions with passive atelectasis. 7. Emphysema. 8. Cardiomegaly with mitral valve calcifications. 9. Sigmoid colon diverticulosis. 10. Systemic atherosclerosis. 11. Sclerosis in the sacrum likely from remote sacral insufficiency fracture. 12. Lower thoracic and upper lumbar scoliosis. 13. Spondylosis with right foraminal impingement at L4-5 and L5-S1. Electronically signed by: Ryan Salvage MD 01/12/2025 02:30 PM EST RP Workstation: [id number]    Cardiac Studies   Echo pending  Assessment   Principal Problem:   Appendicitis Active Problems:   Atrial fibrillation with rapid ventricular response (HCC)   Acute appendicitis with appendiceal abscess   Plan   Needs better rate control - BP improved today in the 120's systolic - ok to increase diltiazem  to 15 mg/hr. I discussed this with nursing. Will follow-up on echo results. Hemoglobin lower today at 8.6 - holding anticoagulation.  Time Spent Directly with Patient:  I have spent a total of 25 minutes with the patient reviewing hospital notes, telemetry, EKGs, labs and examining the patient as well as establishing an assessment and plan  that was discussed personally with the patient.  > 50% of time was spent in direct patient care.  Length of Stay:  LOS: 2 days   Vinie KYM Maxcy, MD, [facility], FNLA, FACP  [place]  [facility]  Medical Director of the [facility]  [facility] Diplomate of the American Board of Clinical Lipidology Attending Cardiologist  Direct Dial: [phone]  Fax: [phone]  Website:  [email or web address] Ibrahima Holberg 01/14/2025, 11:15 AM   "

## 2025-01-14 NOTE — Progress Notes (Addendum)
 Patients heart rate has sustained 120s-140s atrial fibrillation. Cardizem  gtt 15ml/hr. Secure chat sent. Cardiologist office contacted, provider paged [phone].    01/14/25 1238  Vitals  BP (!) 109/57  MAP (mmHg) 72  Pulse Rate (!) 142  ECG Heart Rate (!) 135  Resp 20  MEWS COLOR  MEWS Score Color Yellow  Oxygen Therapy  SpO2 93 %  O2 Device Room Air  MEWS Score  MEWS Temp 0  MEWS Systolic 0  MEWS Pulse 3  MEWS RR 0  MEWS LOC 0  MEWS Score 3

## 2025-01-14 NOTE — Progress Notes (Signed)
 " PROGRESS NOTE  [name] FMW:[id number] DOB: [birth year]   PCP: [name], MD  Patient is from: Home.  Lives with husband.  Uses rolling walker for mobility.  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    In ED, in A-fib with RVR with HR from 120s to 140s.  WBC 84.9 (baseline 40-50).  Hgb 10 (baseline 9-10).  Fecal Hemoccult positive.  CT abdomen and pelvis showed acute appendicitis with periappendiceal abscess, cholelithiasis with linear calcification of the distal CBD suspicious for choledocholithiasis.  General surgery consulted.  Patient was started on IV cefepime  and Flagyl , IV fluids and admitted for further care.  The next day, started on IV Cardizem .  Cardiology consulted.   Subjective: Seen and examined earlier this morning.  No major events overnight or this morning.  Remains in A-fib with RVR.  Reports improvement in her abdominal pain.  Has not had a bowel movement last night.  Denies nausea or vomiting.  Working on oral contrast for CT   Assessment and plan: Acute appendicitis with periappendiceal abscess-presents with RLQ pain, nausea and vomiting.  Markedly elevated leukocytosis above baseline.  -Continue IV cefepime  and Flagyl . -General Surgery on board.  Follow repeat CT abdomen and pelvis. - Continue clear liquid diet.   Persistent A-fib with RVR: RVR could be due to missed dose of medication and infection as above.  - Cardiology on board. - Continue Cardizem  drip -Follow echocardiogram. -Optimize  electrolytes. -Admitted to stepdown unit. - Holding Eliquis  due to melanotic stool.  Normocytic anemia: Patient reports melanotic stool.  Heme occult positive but H&H relatively at baseline.  Hemoglobin drifted down this morning.  No overt bleeding.  Has not a bowel movement since admission. Recent Labs    05/12/24 1251 06/23/24 1242 08/07/24 1342 09/22/24 1245 11/03/24 1022 11/25/24 0938 12/15/24 1057 01/12/25 1123 01/13/25 0615 01/14/25 0304  HGB 10.7* 11.0* 10.8* 11.2* 10.3* 10.4* 9.5* 10.4* 9.1* 8.6*  -Continue IV Protonix . -Continue holding Eliquis .  Chronic HFmrEF: TTE in 2023 with LVEF of 45 to 50%, GH, indeterminate DD, RVSP of 38.2 mmHg.  Overall, appears euvolemic.  No cardiopulmonary symptoms.  Seems to be on p.o. Lasix  20 mg daily at home. - Continue monitoring fluid status. - Strict intake and output, daily weight - Follow echocardiogram  Cholelithiasis with possible choledocholithiasis: CT raise concern for choledocholithiasis but LFT within normal. - Monitor clinically.  Physical deconditioning: Patient reports ambulating with walker at baseline. - PT/OT  Chronic COPD: Stable. 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   CMML with elevated WBC:  - Appreciate input by oncology.   Alcohol use: No withdrawal symptoms. - Continue monitoring  Thrombocytopenia: Mild - Continue monitoring  Nutrition Body mass index is 19.87 kg/m. - Will consult dietitian when she started eating        Wound 01/13/25 1900 Pressure Injury Buttocks Left;Lower Stage 1 -  Intact skin with non-blanchable redness of a localized area usually over a bony prominence. (Active)   Pressure skin injury: She has dark  blister on right leg. -Consult wound care DVT prophylaxis:  SCDs Start: 01/12/25 1820  Code Status: Full code Family Communication: Updated patient's husband at bedside Level of care: Stepdown Status is:  Inpatient Remains inpatient appropriate because: Acute appendicitis with periappendiceal abscess, A-fib with RVR,   Final disposition: Yet to be determined.   55 minutes with more than 50% spent in reviewing records, counseling patient/family and coordinating care.  Consultants:  General Surgery Cardiology Oncology/hematology  Procedures: None  Microbiology summarized: None  Objective: Vitals:   01/14/25 1152 01/14/25 1200 01/14/25 1238 01/14/25 1300  BP: 110/81 (!) 102/54 (!) 109/57 117/60  Pulse:  (!) 122 (!) 142 (!) 138  Resp: 14 15 20  (!) 22  Temp:      TempSrc:      SpO2:  96% 93% 95%  Weight:      Height:        Examination:  GENERAL: No apparent distress.  Nontoxic. HEENT: MMM.  Vision and hearing grossly intact.  NECK: Supple.  No apparent JVD.  RESP:  No IWOB.  Fair aeration bilaterally. CVS: IRR.  HR in 120s.SABRA Heart sounds normal.  ABD/GI/GU: BS+.  Patient declined full abdominal exam due to pain from prior exam. MSK/EXT:  Moves extremities. No apparent deformity.  1+ BLE edema. Bloody blister/bulla over LLE. SKIN: As above. NEURO: AA.  Oriented appropriately.  No apparent focal neuro deficit. PSYCH: Calm. Normal affect.   Sch Meds:  Scheduled Meds:  arformoterol   15 mcg Nebulization BID   And   umeclidinium bromide   1 puff Inhalation Daily   Chlorhexidine  Gluconate Cloth  6 each Topical Daily   gabapentin   300 mg Oral Daily   metoprolol  tartrate  25 mg Oral BID   pantoprazole  (PROTONIX ) IV  40 mg Intravenous Q24H   predniSONE   5 mg Oral Q breakfast   sodium chloride  flush  10-40 mL Intracatheter Q12H   Continuous Infusions:  ceFEPime  (MAXIPIME ) IV Stopped (01/14/25 0548)   diltiazem  (CARDIZEM ) infusion 15 mg/hr (01/14/25 1138)   metronidazole  500 mg (01/14/25 1438)   PRN Meds:.acetaminophen  **OR** acetaminophen , morphine  injection, ondansetron  **OR** ondansetron  (ZOFRAN ) IV, mouth rinse  Antimicrobials: Anti-infectives (From admission,  onward)    Start     Dose/Rate Route Frequency Ordered Stop   01/14/25 0600  ceFEPIme  (MAXIPIME ) 2 g in sodium chloride  0.9 % 100 mL IVPB        2 g 200 mL/hr over 30 Minutes Intravenous Every 12 hours 01/13/25 1629     01/12/25 1830  metroNIDAZOLE  (FLAGYL ) IVPB 500 mg        500 mg 100 mL/hr over 60 Minutes Intravenous Every 12 hours 01/12/25 1822     01/12/25 1830  ceFEPIme  (MAXIPIME ) 2 g in sodium chloride  0.9 % 100 mL IVPB  Status:  Discontinued        2 g 200 mL/hr over 30 Minutes Intravenous Every 8 hours 01/12/25 1825 01/13/25 1629   01/12/25 1445  ceFEPIme  (MAXIPIME ) 2 g in sodium chloride  0.9 % 100 mL IVPB       Placed in And Linked Group   2 g 200 mL/hr over 30 Minutes Intravenous  Once 01/12/25 1436 01/12/25 1533   01/12/25 1445  metroNIDAZOLE  (FLAGYL ) IVPB 500 mg       Placed in And Linked Group   500 mg 100 mL/hr over 60 Minutes Intravenous  Once 01/12/25 1436 01/12/25 1606        I have personally reviewed the following labs and images: CBC:  Recent Labs  Lab 01/12/25 1123 01/13/25 0615 01/14/25 0304  WBC 84.9* 72.3* 70.3*  NEUTROABS  --   --  44.3*  HGB 10.4* 9.1* 8.6*  HCT 32.5* 29.8* 26.8*  MCV 104.8* 108.4* 105.5*  PLT 172 140* 149*   BMP &GFR Recent Labs  Lab 01/12/25 1123 01/12/25 2349 01/13/25 0615 01/14/25 0304  NA 133*  --  134* 136  K 3.9  --  4.2 3.8  CL 97*  --  105 105  CO2 23  --  21* 24  GLUCOSE 124*  --  73 100*  BUN 21  --  16 17  CREATININE 1.07*  --  0.95 0.98  CALCIUM  8.1*  --  6.5* 6.8*  MG  --  2.4  --  2.2  PHOS  --   --   --  2.5   Estimated Creatinine Clearance: 35.6 mL/min (by C-G formula based on SCr of 0.98 mg/dL). Liver & Pancreas: Recent Labs  Lab 01/12/25 1123 01/13/25 0615 01/14/25 0304  AST 26 22 19   ALT 12 10 10   ALKPHOS 105 80 86  BILITOT 0.6 0.4 0.4  PROT 6.3* 4.9* 5.0*  ALBUMIN 3.5 2.8* 2.9*   Recent Labs  Lab 01/12/25 1123  LIPASE 26   No results for input(s): AMMONIA in the last 168  hours. Diabetic: No results for input(s): HGBA1C in the last 72 hours. No results for input(s): GLUCAP in the last 168 hours. Cardiac Enzymes: No results for input(s): CKTOTAL, CKMB, CKMBINDEX, TROPONINI in the last 168 hours. No results for input(s): PROBNP in the last 8760 hours. Coagulation Profile: Recent Labs  Lab 01/12/25 1123  INR 1.7*   Thyroid  Function Tests: No results for input(s): TSH, T4TOTAL, FREET4, T3FREE, THYROIDAB in the last 72 hours. Lipid Profile: No results for input(s): CHOL, HDL, LDLCALC, TRIG, CHOLHDL, LDLDIRECT in the last 72 hours. Anemia Panel: No results for input(s): VITAMINB12, FOLATE, FERRITIN, TIBC, IRON, RETICCTPCT in the last 72 hours. Urine analysis:    Component Value Date/Time   COLORURINE YELLOW 01/12/2025 1407   APPEARANCEUR CLEAR 01/12/2025 1407   LABSPEC 1.029 01/12/2025 1407   PHURINE 6.0 01/12/2025 1407   GLUCOSEU NEGATIVE 01/12/2025 1407   HGBUR SMALL (A) 01/12/2025 1407   BILIRUBINUR NEGATIVE 01/12/2025 1407   KETONESUR NEGATIVE 01/12/2025 1407   PROTEINUR NEGATIVE 01/12/2025 1407   UROBILINOGEN 0.2 04/28/2011 1859   NITRITE NEGATIVE 01/12/2025 1407   LEUKOCYTESUR NEGATIVE 01/12/2025 1407   Sepsis Labs: Invalid input(s): PROCALCITONIN, LACTICIDVEN  Microbiology: Recent Results (from the past 240 hours)  MRSA Next Gen by PCR, Nasal     Status: None   Collection Time: 01/13/25  7:03 PM   Specimen: Nasal Mucosa; Nasal Swab  Result Value Ref Range Status   MRSA by PCR Next Gen NOT DETECTED NOT DETECTED Final    Comment: (NOTE) The GeneXpert MRSA Assay (FDA approved for NASAL specimens only), is one component of a comprehensive MRSA colonization surveillance program. It is not intended to diagnose MRSA infection nor to guide or monitor treatment for MRSA infections. Test performance is not FDA approved in patients less than 44 years old. Performed at [facility], [address], [place], [place] [address]     Radiology Studies: No results found.     Ambre Kobayashi T. Ervie Mccard [facility]  If 7PM-7AM, please contact night-coverage [email or web address] 01/14/2025, 3:28 PM   "

## 2025-01-15 ENCOUNTER — Inpatient Hospital Stay (HOSPITAL_COMMUNITY)

## 2025-01-15 DIAGNOSIS — K3580 Unspecified acute appendicitis: Secondary | ICD-10-CM | POA: Diagnosis not present

## 2025-01-15 DIAGNOSIS — K3533 Acute appendicitis with perforation and localized peritonitis, with abscess: Secondary | ICD-10-CM | POA: Diagnosis not present

## 2025-01-15 DIAGNOSIS — I4891 Unspecified atrial fibrillation: Secondary | ICD-10-CM

## 2025-01-15 DIAGNOSIS — I3139 Other pericardial effusion (noninflammatory): Secondary | ICD-10-CM | POA: Diagnosis not present

## 2025-01-15 DIAGNOSIS — I4892 Unspecified atrial flutter: Secondary | ICD-10-CM

## 2025-01-15 LAB — CBC
HCT: 29.9 % — ABNORMAL LOW (ref 36.0–46.0)
Hemoglobin: 9.1 g/dL — ABNORMAL LOW (ref 12.0–15.0)
MCH: 33.3 pg (ref 26.0–34.0)
MCHC: 30.4 g/dL (ref 30.0–36.0)
MCV: 109.5 fL — ABNORMAL HIGH (ref 80.0–100.0)
Platelets: 150 K/uL (ref 150–400)
RBC: 2.73 MIL/uL — ABNORMAL LOW (ref 3.87–5.11)
RDW: 17.1 % — ABNORMAL HIGH (ref 11.5–15.5)
WBC: 77.7 K/uL (ref 4.0–10.5)
nRBC: 0 % (ref 0.0–0.2)

## 2025-01-15 LAB — ECHOCARDIOGRAM COMPLETE
Area-P 1/2: 8.34 cm2
Height: 61 in
S' Lateral: 2.3 cm
Weight: 1682.55 [oz_av]

## 2025-01-15 LAB — COMPREHENSIVE METABOLIC PANEL WITH GFR
ALT: 9 U/L (ref 0–44)
AST: 19 U/L (ref 15–41)
Albumin: 2.8 g/dL — ABNORMAL LOW (ref 3.5–5.0)
Alkaline Phosphatase: 82 U/L (ref 38–126)
Anion gap: 9 (ref 5–15)
BUN: 12 mg/dL (ref 8–23)
CO2: 20 mmol/L — ABNORMAL LOW (ref 22–32)
Calcium: 6.6 mg/dL — ABNORMAL LOW (ref 8.9–10.3)
Chloride: 107 mmol/L (ref 98–111)
Creatinine, Ser: 0.89 mg/dL (ref 0.44–1.00)
GFR, Estimated: >60 mL/min
Glucose, Bld: 96 mg/dL (ref 70–99)
Potassium: 3.8 mmol/L (ref 3.5–5.1)
Sodium: 136 mmol/L (ref 135–145)
Total Bilirubin: 0.4 mg/dL (ref 0.0–1.2)
Total Protein: 5.2 g/dL — ABNORMAL LOW (ref 6.5–8.1)

## 2025-01-15 LAB — MAGNESIUM: Magnesium: 2.2 mg/dL (ref 1.7–2.4)

## 2025-01-15 LAB — PROTIME-INR
INR: 1.8 — ABNORMAL HIGH (ref 0.8–1.2)
Prothrombin Time: 22.2 s — ABNORMAL HIGH (ref 11.4–15.2)

## 2025-01-15 MED ORDER — SODIUM CHLORIDE 0.9% FLUSH
5.0000 mL | Freq: Three times a day (TID) | INTRAVENOUS | Status: DC
  Administered 2025-01-15 – 2025-01-20 (×15): 5 mL

## 2025-01-15 MED ORDER — FENTANYL CITRATE (PF) 100 MCG/2ML IJ SOLN
INTRAMUSCULAR | Status: AC
  Filled 2025-01-15: qty 2

## 2025-01-15 MED ORDER — SODIUM CHLORIDE 0.9 % IV SOLN
INTRAVENOUS | Status: AC
  Filled 2025-01-15: qty 500

## 2025-01-15 MED ORDER — AMIODARONE HCL IN DEXTROSE 360-4.14 MG/200ML-% IV SOLN
60.0000 mg/h | INTRAVENOUS | Status: AC
  Administered 2025-01-15: 60 mg/h via INTRAVENOUS
  Filled 2025-01-15: qty 200

## 2025-01-15 MED ORDER — AMIODARONE LOAD VIA INFUSION
150.0000 mg | Freq: Once | INTRAVENOUS | Status: AC
  Administered 2025-01-15: 150 mg via INTRAVENOUS
  Filled 2025-01-15: qty 83.34

## 2025-01-15 MED ORDER — MIDAZOLAM HCL (PF) 2 MG/2ML IJ SOLN
INTRAMUSCULAR | Status: AC | PRN
  Administered 2025-01-15: 1 mg via INTRAVENOUS

## 2025-01-15 MED ORDER — FENTANYL CITRATE (PF) 100 MCG/2ML IJ SOLN
INTRAMUSCULAR | Status: AC | PRN
  Administered 2025-01-15: 50 ug via INTRAVENOUS

## 2025-01-15 MED ORDER — MIDAZOLAM HCL 2 MG/2ML IJ SOLN
INTRAMUSCULAR | Status: AC
  Filled 2025-01-15: qty 2

## 2025-01-15 MED ORDER — AMIODARONE HCL IN DEXTROSE 360-4.14 MG/200ML-% IV SOLN
60.0000 mg/h | INTRAVENOUS | Status: DC
  Administered 2025-01-15: 30 mg/h via INTRAVENOUS
  Administered 2025-01-16: 60 mg/h via INTRAVENOUS
  Administered 2025-01-16: 30 mg/h via INTRAVENOUS
  Administered 2025-01-16 – 2025-01-18 (×7): 60 mg/h via INTRAVENOUS
  Filled 2025-01-15 (×12): qty 200

## 2025-01-15 NOTE — Sedation Documentation (Signed)
 RN Cheynne Virden pulled 2 mg Versed  and 100 mcg Fentanyl  in Ct pysix. Pt. Received 2 mg Versed  and 100 mcg Fentanyl  throughout the procedure.

## 2025-01-15 NOTE — Progress Notes (Signed)
 "  Progress Note     Subjective:  Minimal right lower quadrant pain.  She did have a loose bowel movement this morning which she states was dark, but not sure if melanotic or any blood.  No nausea.  ROS  All negative with the exception of above.  Objective: Vital signs in last 24 hours: Temp:  [97.8 F (36.6 C)-98.6 F (37 C)] 98 F (36.7 C) (01/22 0800) Pulse Rate:  [61-149] 82 (01/22 0700) Resp:  [11-28] 26 (01/22 0700) BP: (84-127)/(36-81) 95/58 (01/22 0700) SpO2:  [86 %-99 %] 86 % (01/22 0700) Last BM Date : 01/14/25  Intake/Output from previous day: 01/21 0701 - 01/22 0700 In: 810.7 [P.O.:120; I.V.:275.2; IV Piggyback:415.5] Out: 950 [Urine:950] Intake/Output this shift: No intake/output data recorded.  PE: General: Pleasant chronically ill appearing female who is laying in bed in NAD. Chest:  tachy/ af rvr; blood pressures appear a little bit soft, respiratory effort nonlabored  Abd: Soft, nondistended.  Minimal tenderness to palpation of RLQ.  No peritoneal signs Skin: Warm and dry.  Psych: A&Ox3 with an appropriate affect.    Lab Results:  Recent Labs    01/13/25 0615 01/14/25 0304  WBC 72.3* 70.3*  HGB 9.1* 8.6*  HCT 29.8* 26.8*  PLT 140* 149*   BMET Recent Labs    01/13/25 0615 01/14/25 0304  NA 134* 136  K 4.2 3.8  CL 105 105  CO2 21* 24  GLUCOSE 73 100*  BUN 16 17  CREATININE 0.95 0.98  CALCIUM  6.5* 6.8*   PT/INR Recent Labs    01/12/25 1123  LABPROT 20.6*  INR 1.7*   CMP     Component Value Date/Time   NA 136 01/14/2025 0304   NA 141 01/04/2016 1001   K 3.8 01/14/2025 0304   K 4.5 01/04/2016 1001   CL 105 01/14/2025 0304   CO2 24 01/14/2025 0304   CO2 24 01/04/2016 1001   GLUCOSE 100 (H) 01/14/2025 0304   GLUCOSE 105 01/04/2016 1001   BUN 17 01/14/2025 0304   BUN 21.7 01/04/2016 1001   CREATININE 0.98 01/14/2025 0304   CREATININE 1.48 (H) 12/15/2024 1057   CREATININE 1.2 (H) 01/04/2016 1001   CALCIUM  6.8 (L) 01/14/2025  0304   CALCIUM  9.7 01/04/2016 1001   PROT 5.0 (L) 01/14/2025 0304   PROT 7.0 01/04/2016 1001   ALBUMIN 2.9 (L) 01/14/2025 0304   ALBUMIN 4.0 01/04/2016 1001   AST 19 01/14/2025 0304   AST 21 12/21/2023 1359   AST 26 01/04/2016 1001   ALT 10 01/14/2025 0304   ALT 14 12/21/2023 1359   ALT 24 01/04/2016 1001   ALKPHOS 86 01/14/2025 0304   ALKPHOS 61 01/04/2016 1001   BILITOT 0.4 01/14/2025 0304   BILITOT 0.8 12/21/2023 1359   BILITOT 0.41 01/04/2016 1001   GFRNONAA 59 (L) 01/14/2025 0304   GFRNONAA 36 (L) 12/15/2024 1057   GFRAA 56 (L) 01/09/2020 1339   Lipase     Component Value Date/Time   LIPASE 26 01/12/2025 1123       Studies/Results: CT ABDOMEN PELVIS W CONTRAST Result Date: 01/14/2025 EXAM: CT ABDOMEN AND PELVIS WITH CONTRAST 01/14/2025 03:25:20 PM TECHNIQUE: CT of the abdomen and pelvis was performed with the administration of 80 mL iohexol  (OMNIPAQUE ) 300 MG/ML solution. Multiplanar reformatted images are provided for review. Automated exposure control, iterative reconstruction, and/or weight-based adjustment of the mA/kV was utilized to reduce the radiation dose to as low as reasonably achievable. COMPARISON: 01/12/2025 CLINICAL HISTORY: RLQ  abdominal pain FINDINGS: LOWER CHEST: Similar moderate volume bilateral pleural effusions. Similar appearance of the enhancing ovoid mass in the right lung base in the extrapleural fat measuring 13 x 27 mm. Similar compressive atelectasis also noted in the lung bases. Partially visualized central venous catheter terminating at the cavoatrial junction. LIVER: Unchanged cyst in the left hepatic lobe. Mild dilation of the intrahepatic biliary ducts. GALLBLADDER AND BILE DUCTS: Punctate radiopaque gallstones. No wall thickening. Minimal dilation of the intrahepatic biliary ducts with a 5 mm calculus layering in the distal common bile duct (axial 45). SPLEEN: No acute abnormality. PANCREAS: No acute abnormality. ADRENAL GLANDS: No acute  abnormality. KIDNEYS, URETERS AND BLADDER: Similar appearance of multiple bilateral renal cysts. No stones in the kidneys or ureters. No hydronephrosis. No perinephric or periureteral stranding. The urinary bladder is distended without focal abnormality. GI AND BOWEL: Decompressed stomach. Ampullary duodenal diverticulum measuring 1.9 cm. Decompressed appendix with mucosal enhancement. Similar appearance of the peripherally enhancing fluid collection centered about the distal appendix, measuring 3.2 x 4.5 x 3.9 cm. Extensive descending and sigmoid colonic diverticulosis. No changes of acute diverticulitis. No extravasation of enteric contrast to suggest bowel perforation. There is no bowel obstruction. PERITONEUM AND RETROPERITONEUM: No ascites. No free air. Similar appearance of the large multilobular fat containing masses in the presacral space. VASCULATURE: Aorta is normal in caliber. Diffuse aortoiliac atherosclerosis. LYMPH NODES: No lymphadenopathy. REPRODUCTIVE ORGANS: Age related atrophy of the uterus and ovaries. BONES AND SOFT TISSUES: Diffuse osteopenia. Moderate dextral curvature of the thoracolumbar spine with superimposed multilevel degenerative disc disease. Mild anasarca. No focal soft tissue abnormality. IMPRESSION: 1. Stable periappendiceal abscess centered about the distal appendix, measuring 3.2 x 4.5 x 3.9 cm. 2. 5 mm linear calcification again noted in the distal CBD, most likely reflecting choledocholithiasis. Minimal intrahepatic biliary ductal dilation present, correlation with serum bilirubin recommended. 3. Similar appearance of the moderate bilateral pleural effusions. Electronically signed by: Rogelia Myers MD 01/14/2025 04:05 PM EST RP Workstation: [id number]    Anti-infectives: Anti-infectives (From admission, onward)    Start     Dose/Rate Route Frequency Ordered Stop   01/14/25 0600  ceFEPIme  (MAXIPIME ) 2 g in sodium chloride  0.9 % 100 mL IVPB        2 g 200 mL/hr over 30  Minutes Intravenous Every 12 hours 01/13/25 1629     01/12/25 1830  metroNIDAZOLE  (FLAGYL ) IVPB 500 mg        500 mg 100 mL/hr over 60 Minutes Intravenous Every 12 hours 01/12/25 1822     01/12/25 1830  ceFEPIme  (MAXIPIME ) 2 g in sodium chloride  0.9 % 100 mL IVPB  Status:  Discontinued        2 g 200 mL/hr over 30 Minutes Intravenous Every 8 hours 01/12/25 1825 01/13/25 1629   01/12/25 1445  ceFEPIme  (MAXIPIME ) 2 g in sodium chloride  0.9 % 100 mL IVPB       Placed in And Linked Group   2 g 200 mL/hr over 30 Minutes Intravenous  Once 01/12/25 1436 01/12/25 1533   01/12/25 1445  metroNIDAZOLE  (FLAGYL ) IVPB 500 mg       Placed in And Linked Group   500 mg 100 mL/hr over 60 Minutes Intravenous  Once 01/12/25 1436 01/12/25 1606        Assessment/Plan Periappendiceal abscess -Reviewed with IR 1/19, plan at that time for repeat CT in a few days- CT 1/22 with essentially no change. IR re-eval pending -Afebrile, WBC pending for today -Continue with IV abx  -  Given her frailty and multiple medical problems including recent chemo, daily steroid use, Eliquis , active A-fib with RVR and hypotension etc, we pan to continue to treat with conservative management at this time and avoid surgical intervention if able. She is extremely high risk.   FEN - Npo for possible procedure; IVFs per primary team VTE - Hold Eliquis , hold addition of heparin  gtt due to melena ID - Cefipime/Flagyl    UGI Bleed/Melena/anemia - unclear etiology.  No history of ulcer disease or NSAID use.  Her hgb appears stable at this time and she is HDS stable considering her Afib with RVR.  Will need GI eval  Choledocholithiasis - seems to be asymptomatic.  LFTs are normal indicating likely this to be nonocclusive.  Monitor LFTs.  At some point, will likely need this addressed, but as of now this is not the most acute problem. 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 WBC baseline prior to admission  ranging from 25-47 CHF/ cardiomyopathy A fib, RVR - cardiology consulted, echo pending. Anticoagulation on hold for GI bleed COPD Lung nodules HTN RA - on Remicade  and Prednisone  (5mg  daily) Extramedullary hematopoiesis with large presacral tumor CKD PVD Physical deconditioning Malnutrition Celiac disease Alcohol use RLE pressure skin injury   LOS: 3 days   I reviewed specialist notes, hospitalist notes, consulting provider notes, nursing notes, last 24 h vitals and pain scores, last 48 h intake and output, last 24 h labs and trends, and last 24 h imaging results.  This care required high level of medical decision making.   Mitzie DELENA Freund, MD  [facility] 01/15/2025, 8:55 AM Please see Amion for pager number during day hours 7:00am-4:30pm  "

## 2025-01-15 NOTE — Plan of Care (Signed)

## 2025-01-15 NOTE — Progress Notes (Addendum)
 "   DAILY PROGRESS NOTE   Patient Name: [name] Date of Encounter: 01/15/2025 Cardiologist: Madonna Large, DO  Chief Complaint   Confused  Patient Profile   [name] is a 79 y.o. 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   Subjective   Afib with RVR overnight- has been hypotensive. Echo today shows low normal LVEF 50-55%, moderate-sized pericardial effusion, large pleural effusion.   Objective   Vitals:   01/15/25 0700 01/15/25 0800 01/15/25 0900 01/15/25 0907  BP: (!) 95/58 (!) 99/53 (!) 100/55   Pulse: 82 (!) 133 (!) 160   Resp: (!) 26 20    Temp:  98 F (36.7 C)    TempSrc:  Oral    SpO2: (!) 86% 96% 94% 94%  Weight:      Height:        Intake/Output Summary (Last 24 hours) at 01/15/2025 1130 Last data filed at 01/15/2025 0600 Gross per 24 hour  Intake 690.74 ml  Output 950 ml  Net -259.26 ml   Filed Weights   01/12/25 2108 01/13/25 1936  Weight: 47.9 kg 47.7 kg    Physical Exam   General appearance: alert and no distress Lungs: clear to auscultation bilaterally Heart: irregularly irregular rhythm Extremities: extremities normal, atraumatic, no cyanosis or edema Neurologic: Mental status: awake, but confused  Inpatient Medications    Scheduled Meds:  arformoterol   15 mcg Nebulization BID   And   umeclidinium bromide   1 puff Inhalation Daily   Chlorhexidine  Gluconate Cloth  6 each Topical Daily   gabapentin   300 mg Oral Daily   pantoprazole  (PROTONIX ) IV  40 mg Intravenous Q24H   predniSONE   5 mg Oral Q breakfast   sodium chloride  flush  10-40 mL Intracatheter Q12H    Continuous Infusions:  amiodarone  60 mg/hr (01/15/25 1031)   Followed by   amiodarone      ceFEPime  (MAXIPIME ) IV Stopped (01/15/25 0550)    metronidazole  Stopped (01/15/25 0444)    PRN Meds: acetaminophen  **OR** acetaminophen , morphine  injection, ondansetron  **OR** ondansetron  (ZOFRAN ) IV, mouth rinse   Labs   Results for orders placed or performed during the hospital encounter of 01/12/25 (from the past 48 hours)  MRSA Next Gen by PCR, Nasal     Status: None   Collection Time: 01/13/25  7:03 PM   Specimen: Nasal Mucosa; Nasal Swab  Result Value Ref Range   MRSA by PCR Next Gen NOT DETECTED NOT DETECTED    Comment: (NOTE) The GeneXpert MRSA Assay (FDA approved for NASAL specimens only), is one component of a comprehensive MRSA colonization surveillance program. It is not intended to diagnose MRSA infection nor to guide or monitor treatment for MRSA infections. Test performance is not FDA approved in patients less than 39 years old. Performed at [facility], [address], [place], [place] [address]   Comprehensive metabolic panel with GFR     Status: Abnormal   Collection Time: 01/14/25  3:04 AM  Result Value Ref Range   Sodium 136 135 - 145 mmol/L   Potassium 3.8 3.5 - 5.1 mmol/L   Chloride 105 98 - 111 mmol/L   CO2 24 22 - 32 mmol/L   Glucose, Bld 100 (H) 70 - 99 mg/dL    Comment: Glucose reference range applies only  to samples taken after fasting for at least 8 hours.   BUN 17 8 - 23 mg/dL   Creatinine, Ser 9.01 0.44 - 1.00 mg/dL   Calcium  6.8 (L) 8.9 - 10.3 mg/dL   Total Protein 5.0 (L) 6.5 - 8.1 g/dL   Albumin 2.9 (L) 3.5 - 5.0 g/dL   AST 19 15 - 41 U/L   ALT 10 0 - 44 U/L   Alkaline Phosphatase 86 38 - 126 U/L   Total Bilirubin 0.4 0.0 - 1.2 mg/dL   GFR, Estimated 59 (L) >60 mL/min    Comment: (NOTE) Calculated using the CKD-EPI Creatinine Equation (2021)    Anion gap 7 5 - 15    Comment: Performed at [facility], [address] [place], [place] [address]  CBC with Differential/Platelet     Status: Abnormal   Collection Time: 01/14/25  3:04 AM  Result Value  Ref Range   WBC 70.3 (HH) 4.0 - 10.5 K/uL    Comment: CRITICAL VALUE NOTED.  VALUE IS CONSISTENT WITH PREVIOUSLY REPORTED AND CALLED VALUE. REPEATED TO VERIFY WHITE COUNT CONFIRMED ON SMEAR    RBC 2.54 (L) 3.87 - 5.11 MIL/uL   Hemoglobin 8.6 (L) 12.0 - 15.0 g/dL   HCT 73.1 (L) 63.9 - 53.9 %   MCV 105.5 (H) 80.0 - 100.0 fL   MCH 33.9 26.0 - 34.0 pg   MCHC 32.1 30.0 - 36.0 g/dL   RDW 83.0 (H) 88.4 - 84.4 %   Platelets 149 (L) 150 - 400 K/uL   nRBC 0.0 0.0 - 0.2 %   Neutrophils Relative % 61 %   Neutro Abs 44.3 (H) 1.7 - 7.7 K/uL   Band Neutrophils 2 %   Lymphocytes Relative 30 %   Lymphs Abs 21.1 (H) 0.7 - 4.0 K/uL   Monocytes Relative 3 %   Monocytes Absolute 2.1 (H) 0.1 - 1.0 K/uL   Eosinophils Relative 0 %   Eosinophils Absolute 0.0 0.0 - 0.5 K/uL   Basophils Relative 1 %   Basophils Absolute 0.7 (H) 0.0 - 0.1 K/uL   WBC Morphology See Note     Comment: Mild Left Shift (1-5% metas, occ myelo) Abnormal lymphocytes present    RBC Morphology MORPHOLOGY UNREMARKABLE    Smear Review Normal platelet morphology    Other 2 %   Myelocytes 1 %   Abs Immature Granulocytes 0.70 (H) 0.00 - 0.07 K/uL    Comment: Performed at [facility], [address], [place], [place] [address]  Magnesium      Status: None   Collection Time: 01/14/25  3:04 AM  Result Value Ref Range   Magnesium  2.2 1.7 - 2.4 mg/dL    Comment: Performed at [facility], [address] [place], [place] [address]  Phosphorus     Status: None   Collection Time: 01/14/25  3:04 AM  Result Value Ref Range   Phosphorus 2.5 2.5 - 4.6 mg/dL    Comment: Performed at [facility], [address], [place], [place] [address]  Pathologist smear review     Status: None   Collection Time: 01/14/25  3:04 AM  Result Value Ref Range   Path Review       FINDINGS CONSISTENT WITH KNOWN CMML WITH MONOCYTIC LEFT SHIFT AND SOME PROMONOCYTES. CLINICAL CORRELATION RECOMMENDED.     Comment: Reviewed by Mark P. LeGolvan, M.D.  01/14/25 Performed at [facility], [address], [place], [place] [address]  Magnesium      Status: None   Collection Time: 01/15/25  8:30 AM  Result Value Ref Range   Magnesium  2.2 1.7 - 2.4 mg/dL    Comment: Performed at [facility], [address] [place], [place] [address]  CBC     Status: Abnormal   Collection Time: 01/15/25  8:30 AM  Result Value Ref Range   WBC 77.7 (HH) 4.0 - 10.5 K/uL    Comment: REPEATED TO VERIFY CRITICAL VALUE NOTED.  VALUE IS CONSISTENT WITH PREVIOUSLY REPORTED AND CALLED VALUE. WHITE COUNT CONFIRMED ON SMEAR    RBC 2.73 (L) 3.87 - 5.11 MIL/uL   Hemoglobin 9.1 (L) 12.0 - 15.0 g/dL   HCT 70.0 (L) 63.9 - 53.9 %   MCV 109.5 (H) 80.0 - 100.0 fL   MCH 33.3 26.0 - 34.0 pg   MCHC 30.4 30.0 - 36.0 g/dL   RDW 82.8 (H) 88.4 - 84.4 %   Platelets 150 150 - 400 K/uL   nRBC 0.0 0.0 - 0.2 %    Comment: Performed at [facility], [address] [place], [place] [address]  Protime-INR     Status: Abnormal   Collection Time: 01/15/25  8:30 AM  Result Value Ref Range   Prothrombin Time 22.2 (H) 11.4 - 15.2 seconds   INR 1.8 (H) 0.8 - 1.2    Comment: (NOTE) INR goal varies based on device and disease states. Performed at [facility], [address], [place], [place] [address]   Comprehensive metabolic panel     Status: Abnormal   Collection Time: 01/15/25  8:30 AM  Result Value Ref Range   Sodium 136 135 - 145 mmol/L   Potassium 3.8 3.5 - 5.1 mmol/L   Chloride 107 98 - 111 mmol/L   CO2 20 (L) 22 - 32 mmol/L   Glucose, Bld 96 70 - 99 mg/dL    Comment: Glucose reference range applies only to samples taken after fasting for at least 8 hours.   BUN 12 8 - 23 mg/dL   Creatinine, Ser 9.10 0.44 - 1.00 mg/dL   Calcium  6.6 (L) 8.9 - 10.3 mg/dL   Total Protein 5.2 (L) 6.5 - 8.1 g/dL   Albumin 2.8 (L) 3.5 - 5.0 g/dL   AST 19 15 - 41 U/L    ALT 9 0 - 44 U/L   Alkaline Phosphatase 82 38 - 126 U/L   Total Bilirubin 0.4 0.0 - 1.2 mg/dL   GFR, Estimated >39 >39 mL/min    Comment: (NOTE) Calculated using the CKD-EPI Creatinine Equation (2021)    Anion gap 9 5 - 15    Comment: Performed at [facility], [address], [place], [place] [address]    ECG   N/A  Telemetry   Afib with RVR - Personally Reviewed  Radiology    ECHOCARDIOGRAM COMPLETE Result Date: 01/15/2025    ECHOCARDIOGRAM REPORT   Patient Name:   JEANISE DURFEY [facility] Date of Exam: 01/15/2025 Medical Rec #:  [id number]        Height:       61.0 in Accession #:    [id number]       Weight:       105.2 lb Date of Birth:  1946-07-03       BSA:          1.437 m Patient Age:    78 years         BP:  99/53 mmHg Patient Gender: F                HR:           97 bpm. Exam Location:  Inpatient Procedure: 2D Echo, Cardiac Doppler and Color Doppler (Both Spectral and Color            Flow Doppler were utilized during procedure). Indications:    Atrial Flutter  History:        Patient has prior history of Echocardiogram examinations, most                 recent 03/30/2022. COPD; Arrythmias:Atrial Fibrillation.  Sonographer:    Philomena Daring Referring Phys: [id number] ZANE ADAMS IMPRESSIONS  1. Left ventricular ejection fraction, by estimation, is 50 to 55%. The left ventricle has low normal function. The left ventricle has no regional wall motion abnormalities. Left ventricular diastolic function could not be evaluated.  2. Right ventricular systolic function is low normal. The right ventricular size is normal. Mildly increased right ventricular wall thickness. There is normal pulmonary artery systolic pressure.  3. Moderate size pericardial effusion (best seen subcostal view, posterior to the LV, respiratory variations across the MV and TV not obtained, IVC is dilated but compressible, not suggestive of tamponade physiology, clinical correlation required). Large pleural  effusion in the left lateral region.  4. The mitral valve is degenerative. Mild mitral valve regurgitation. No evidence of mitral stenosis.  5. The aortic valve was not well visualized. Unable to determine aortic valve morphology due to image quality. Aortic valve regurgitation is not visualized. No aortic stenosis is present.  6. The inferior vena cava is dilated in size with >50% respiratory variability, suggesting right atrial pressure of 8 mmHg. Comparison(s): A prior study was performed on 03/30/2022. LVEF 45-50%, RV function mildly reduced, mild/moderate MR, estimated RAP . Pericardial and pleural effusion are new compared to prior study. Conclusion(s)/Recommendation(s): Consider repeat limited echo in 24-48hrs to evaluate the pericaridal effusion and obtain images to evaluate for tamponade physiology if clinically indicated. FINDINGS  Left Ventricle: Left ventricular ejection fraction, by estimation, is 50 to 55%. The left ventricle has low normal function. The left ventricle has no regional wall motion abnormalities. The left ventricular internal cavity size was small. There is no left ventricular hypertrophy. Left ventricular diastolic function could not be evaluated due to atrial fibrillation. Left ventricular diastolic function could not be evaluated. Right Ventricle: The right ventricular size is normal. Mildly increased right ventricular wall thickness. Right ventricular systolic function is low normal. There is normal pulmonary artery systolic pressure. The tricuspid regurgitant velocity is 2.63 m/s, and with an assumed right atrial pressure of 8 mmHg, the estimated right ventricular systolic pressure is 35.7 mmHg. Left Atrium: Left atrial size was normal in size. Right Atrium: Right atrial size was normal in size. Pericardium: Moderate size pericardial effusion (best seen subcostal view, posterior to the LV, respiratory variations across the MV and TV not obtained, IVC is dilated but  compressible, not suggestive of tamponade physiology, clinical correlation required). Mitral Valve: The mitral valve is degenerative in appearance. Mild mitral valve regurgitation. No evidence of mitral valve stenosis. Tricuspid Valve: The tricuspid valve is grossly normal. Tricuspid valve regurgitation is mild . No evidence of tricuspid stenosis. Aortic Valve: The aortic valve was not well visualized. Aortic valve regurgitation is not visualized. No aortic stenosis is present. Pulmonic Valve: The pulmonic valve was not well visualized. Pulmonic valve regurgitation is not visualized. Aorta: The aortic root  and ascending aorta are structurally normal, with no evidence of dilitation. Venous: The inferior vena cava is dilated in size with greater than 50% respiratory variability, suggesting right atrial pressure of 8 mmHg. IAS/Shunts: The atrial septum is grossly normal. Additional Comments: There is a large pleural effusion in the left lateral region.  LEFT VENTRICLE PLAX 2D LVIDd:         3.20 cm   Diastology LVIDs:         2.30 cm   LV e' medial:    8.12 cm/s LV PW:         0.80 cm   LV E/e' medial:  13.6 LV IVS:        0.90 cm   LV e' lateral:   10.47 cm/s LVOT diam:     1.80 cm   LV E/e' lateral: 10.6 LV SV:         39 LV SV Index:   27 LVOT Area:     2.54 cm  RIGHT VENTRICLE             IVC RV S prime:     10.10 cm/s  IVC diam: 2.10 cm TAPSE (M-mode): 1.4 cm LEFT ATRIUM             Index        RIGHT ATRIUM           Index LA diam:        3.40 cm 2.37 cm/m   RA Area:     17.10 cm LA Vol (A2C):   40.2 ml 27.97 ml/m  RA Volume:   40.30 ml  28.04 ml/m LA Vol (A4C):   38.4 ml 26.72 ml/m LA Biplane Vol: 41.8 ml 29.08 ml/m  AORTIC VALVE LVOT Vmax:   104.00 cm/s LVOT Vmean:  67.000 cm/s LVOT VTI:    0.155 m  AORTA Ao Root diam: 2.30 cm Ao Asc diam:  2.30 cm MITRAL VALVE                TRICUSPID VALVE MV Area (PHT): 8.34 cm     TR Peak grad:   27.7 mmHg MV Decel Time: 91 msec      TR Vmax:        263.00 cm/s MV E  velocity: 110.67 cm/s                             SHUNTS                             Systemic VTI:  0.16 m                             Systemic Diam: 1.80 cm Sunit Tolia Electronically signed by Madonna Large Signature Date/Time: 01/15/2025/10:48:44 AM    Final    CT ABDOMEN PELVIS W CONTRAST Result Date: 01/14/2025 EXAM: CT ABDOMEN AND PELVIS WITH CONTRAST 01/14/2025 03:25:20 PM TECHNIQUE: CT of the abdomen and pelvis was performed with the administration of 80 mL iohexol  (OMNIPAQUE ) 300 MG/ML solution. Multiplanar reformatted images are provided for review. Automated exposure control, iterative reconstruction, and/or weight-based adjustment of the mA/kV was utilized to reduce the radiation dose to as low as reasonably achievable. COMPARISON: 01/12/2025 CLINICAL HISTORY: RLQ abdominal pain FINDINGS: LOWER CHEST: Similar moderate volume bilateral pleural effusions. Similar appearance of the enhancing ovoid mass in the  right lung base in the extrapleural fat measuring 13 x 27 mm. Similar compressive atelectasis also noted in the lung bases. Partially visualized central venous catheter terminating at the cavoatrial junction. LIVER: Unchanged cyst in the left hepatic lobe. Mild dilation of the intrahepatic biliary ducts. GALLBLADDER AND BILE DUCTS: Punctate radiopaque gallstones. No wall thickening. Minimal dilation of the intrahepatic biliary ducts with a 5 mm calculus layering in the distal common bile duct (axial 45). SPLEEN: No acute abnormality. PANCREAS: No acute abnormality. ADRENAL GLANDS: No acute abnormality. KIDNEYS, URETERS AND BLADDER: Similar appearance of multiple bilateral renal cysts. No stones in the kidneys or ureters. No hydronephrosis. No perinephric or periureteral stranding. The urinary bladder is distended without focal abnormality. GI AND BOWEL: Decompressed stomach. Ampullary duodenal diverticulum measuring 1.9 cm. Decompressed appendix with mucosal enhancement. Similar appearance of the  peripherally enhancing fluid collection centered about the distal appendix, measuring 3.2 x 4.5 x 3.9 cm. Extensive descending and sigmoid colonic diverticulosis. No changes of acute diverticulitis. No extravasation of enteric contrast to suggest bowel perforation. There is no bowel obstruction. PERITONEUM AND RETROPERITONEUM: No ascites. No free air. Similar appearance of the large multilobular fat containing masses in the presacral space. VASCULATURE: Aorta is normal in caliber. Diffuse aortoiliac atherosclerosis. LYMPH NODES: No lymphadenopathy. REPRODUCTIVE ORGANS: Age related atrophy of the uterus and ovaries. BONES AND SOFT TISSUES: Diffuse osteopenia. Moderate dextral curvature of the thoracolumbar spine with superimposed multilevel degenerative disc disease. Mild anasarca. No focal soft tissue abnormality. IMPRESSION: 1. Stable periappendiceal abscess centered about the distal appendix, measuring 3.2 x 4.5 x 3.9 cm. 2. 5 mm linear calcification again noted in the distal CBD, most likely reflecting choledocholithiasis. Minimal intrahepatic biliary ductal dilation present, correlation with serum bilirubin recommended. 3. Similar appearance of the moderate bilateral pleural effusions. Electronically signed by: Rogelia Myers MD 01/14/2025 04:05 PM EST RP Workstation: [id number]    Cardiac Studies   See echo above  Assessment   Principal Problem:   Appendicitis Active Problems:   Atrial fibrillation with rapid ventricular response (HCC)   Acute appendicitis with appendiceal abscess   Plan   Afib rates not controlled- stopped IV diltiazem  and metoprolol  and switched to amiodarone  this am due to hypotension and no other options. While asleep, her afib rate is in the 90's. She is going to have a CT guided drain placed today. Echo reassuring with improved LVEF, however, there is a pericardial effusion. This may relate to pleural effusion. Would evaluate pleural effusion more - may need  thoracentesis.  Time Spent Directly with Patient:  I have spent a total of 25 minutes with the patient reviewing hospital notes, telemetry, EKGs, labs and examining the patient as well as establishing an assessment and plan that was discussed personally with the patient.  > 50% of time was spent in direct patient care.  Length of Stay:  LOS: 3 days   Vinie KYM Maxcy, MD, [facility], FNLA, FACP  [place]  [facility]  Medical Director of the [facility]  Card[facility] Risk Reduction Clinic Diplomate of the American Board of Clinical Lipidology Attending Cardiologist  Direct Dial: [phone]  Fax: [phone]  Website:  [email or web address] Vinie BROCKS Annahi Short 01/15/2025, 11:30 AM   "

## 2025-01-15 NOTE — Consult Note (Addendum)
 "    Chief Complaint: Patient was seen in consultation today for periappendiceal abscess, with consideration for drainage.  Referring Provider(s): Dr. Mitzie Freund, MD.   Supervising Physician: Jennefer Rover  Patient Status: [facility] - In-pt  Patient is Full Code  History of Present Illness: [name] is a 79 y.o. 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  Patient presented to ED on 1/19 with RLQ abdominal pain, melena, and nausea and vomiting, and admitted with acute appendicitis with abscess, A-fib with RVR and possible choledocholithiasis. CT A/P showed acute appendicitis with periappendiceal abscess, and cholelithiasis with a 5 mm linear calcification within the distal CBD suspicious for choledocholithiasis. WBC 70 from baseline in 40s.  General surgery consulted but recommending IR drainage as patient is a high risk surgical candidate with current co-morbidities. IR had deferred drainage request on 1/19 as collection was small and without air, and recommended repeat imaging in a few days. CT A/P w/ cm on 1/21 was performed, and reviewed and approved by Dr. Jennefer. Patient is tentatively scheduled for abd fluid collection drain placement in IR today.   Patient is alert and laying in bed, calm.  Patient is currently without any significant complaints.  Patient denies any fevers, headache, chest pain, SOB, cough, abdominal pain, nausea, vomiting or hematuria. Endorses dark stools and RLQ pain that is present   She does not use CPAP or home O2. Denies previous complications related to sedation medication.     Past Medical History:  Diagnosis Date   Arthritis    Rheumatoid arthritis   Celiac disease    Chronic kidney disease    stage 3 from MD notes   COPD (chronic obstructive pulmonary disease) (HCC)     Dyspnea    with going up stairs   Family history of adverse reaction to anesthesia    father had hard time waking up   Headache    sinus headaches   Hot flashes    Hypertension    Iron deficiency anemia    Pneumonia    per patient I have walking pneumonia    Past Surgical History:  Procedure Laterality Date   COLONOSCOPY     ECTOPIC PREGNANCY SURGERY      x 2   IR IMAGING GUIDED PORT INSERTION  01/31/2022   REVERSE SHOULDER ARTHROPLASTY Right 02/02/2017   Procedure: RIGHT REVERSE SHOULDER ARTHROPLASTY;  Surgeon: Marcey Her, MD;  Location: [facility] OR;  Service: Orthopedics;  Laterality: Right;    Allergies: Digoxin  and related, Gluten meal, Penicillins, Fexofenadine, Alendronate, and Hydroxychloroquine  Medications: Prior to Admission medications  Medication Sig Start Date End Date Taking? Authorizing Provider  acetaminophen  (TYLENOL ) 500 MG tablet Take 2 tablets (1,000 mg total) by mouth 3 (three) times daily. Patient taking differently: Take 1,000 mg by mouth daily at 12 noon. 11/14/23  Yes Samtani, Jai-Gurmukh, MD  apixaban  (ELIQUIS ) 5 MG TABS tablet TAKE 1 TABLET(5 MG) BY MOUTH TWICE DAILY 01/06/25  Yes Tolia, Sunit, DO  azaCITIDine  (VIDAZA ) 100 MG SUSR Inject 114 mg into the vein every 6 (six) weeks. 03/29/23  Yes [provider]  CLARITIN  10 MG tablet Take 10 mg by mouth daily.   Yes [provider]  denosumab  (PROLIA ) 60 MG/ML SOSY injection Inject 60 mg into the skin every 6 (six) months.  Yes [provider]  diltiazem  (CARDIZEM  CD) 360 MG 24 hr capsule Take 1 capsule (360 mg total) by mouth daily. 07/21/24  Yes Swinyer, Rosaline HERO, NP  furosemide  (LASIX ) 20 MG tablet Take 20 mg by mouth in the morning.   Yes [provider]  gabapentin  (NEURONTIN ) 300 MG capsule Take 300 mg by mouth in the morning. 07/21/14  Yes [provider]  inFLIXimab  (REMICADE ) 100 MG injection Inject into the vein every 8 (eight) weeks.   Yes [provider]  Metoprolol  Tartrate 75 MG TABS Take 2 tablets (150 mg total) by mouth 2 (two) times daily. 11/19/24  Yes Tolia, Sunit, DO  ondansetron  (ZOFRAN ) 4 MG tablet Take 1 tablet (4 mg total) by mouth every 8 (eight) hours as needed for nausea or vomiting. 09/23/24  Yes [name], MD  predniSONE  (DELTASONE ) 5 MG tablet Take 5 mg by mouth daily with breakfast. 09/22/21  Yes [provider]  Tiotropium Bromide-Olodaterol (STIOLTO RESPIMAT ) 2.5-2.5 MCG/ACT AERS Inhale 2 puffs into the lungs daily. 05/21/24  Yes Cobb, Comer GAILS, NP  furosemide  (LASIX ) 20 MG tablet Take 1 tablet (20 mg total) by mouth daily. Patient not taking: Reported on 01/12/2025 05/21/22 01/12/25  Antoinette Doe, MD  nitrofurantoin , macrocrystal-monohydrate, (MACROBID ) 100 MG capsule Take 1 capsule (100 mg total) by mouth 2 (two) times daily. Patient not taking: Reported on 01/12/2025 11/25/24   Hanford Powell BRAVO, NP     Family History  Problem Relation Age of Onset   Peripheral Artery Disease Father    Diabetes Father     Social History   Socioeconomic History   Marital status: Married    Spouse name: Not on file   Number of children: Not on file   Years of education: Not on file   Highest education level: Not on file  Occupational History   Not on file  Tobacco Use   Smoking status: Former    Current packs/day: 0.00    Average packs/day: 1 pack/day for 30.0 years (30.0 ttl pk-yrs)    Types: Cigarettes    Start date: 63    Quit date: 2005    Years since quitting: 21.0   Smokeless tobacco: Never  Vaping Use   Vaping status: Never Used  Substance and Sexual Activity   Alcohol use: Yes    Alcohol/week: 12.0 - 14.0 standard drinks of alcohol    Types: 12 - 14 Glasses of wine per week   Drug use: No   Sexual activity: Not Currently  Other Topics Concern   Not on file  Social History Narrative   Not on file   Social Drivers of Health   Tobacco Use: Medium Risk (01/12/2025)   Patient History     Smoking Tobacco Use: Former    Smokeless Tobacco Use: Never    Passive Exposure: Not on Actuary Strain: Not on file  Food Insecurity: No Food Insecurity (01/13/2025)   Epic    Worried About Programme Researcher, Broadcasting/film/video in the Last Year: Never true    Ran Out of Food in the Last Year: Never true  Transportation Needs: No Transportation Needs (01/13/2025)   Epic    Lack of Transportation (Medical): No    Lack of Transportation (Non-Medical): No  Physical Activity: Not on file  Stress: Not on file  Social Connections: Unknown (01/13/2025)   Social Connection and Isolation Panel    Frequency of Communication with Friends and Family: More than three times a week  Frequency of Social Gatherings with Friends and Family: Twice a week    Attends Religious Services: 1 to 4 times per year    Active Member of Clubs or Organizations: Patient unable to answer    Attends Banker Meetings: Patient unable to answer    Marital Status: Married  Depression (PHQ2-9): Low Risk (12/22/2024)   Depression (PHQ2-9)    PHQ-2 Score: 0  Alcohol Screen: Not on file  Housing: Low Risk (01/13/2025)   Epic    Unable to Pay for Housing in the Last Year: No    Number of Times Moved in the Last Year: 0    Homeless in the Last Year: No  Recent Concern: Housing - At Risk (12/15/2024)   ACO Reach    Has Housing: No    Worried About Losing Housing: No    Unable to Get Utilities: No  Utilities: Not At Risk (01/13/2025)   Epic    Threatened with loss of utilities: No  Recent Concern: Utilities - At Risk (12/15/2024)   ACO Reach    Has Housing: No    Worried About Losing Housing: No    Unable to Get Utilities: No  Health Literacy: Not on file     Review of Systems: A 12 point ROS discussed and pertinent positives are indicated in the HPI above.  All other systems are negative.  Vital Signs: BP (!) 100/55   Pulse (!) 160   Temp 98 F (36.7 C) (Oral)   Resp 20   Ht 5' 1 (1.549 m)    Wt 105 lb 2.6 oz (47.7 kg)   SpO2 94%   BMI 19.87 kg/m   Advance Care Plan: The advanced care place/surrogate decision maker was discussed at the time of visit and the patient did not wish to discuss or was not able to name a surrogate decision maker or provide an advance care plan.  Physical Exam HENT:     Mouth/Throat:     Mouth: Mucous membranes are moist.     Pharynx: Oropharynx is clear.  Cardiovascular:     Rate and Rhythm: Tachycardia present. Rhythm irregular.     Heart sounds: Normal heart sounds.     Comments: R chest port a cath present and accessed Pulmonary:     Effort: Pulmonary effort is normal. No respiratory distress.     Breath sounds: No wheezing.     Comments: Diminished bilateral Abdominal:     General: There is no distension.     Palpations: Abdomen is soft.     Comments: RLQ tenderness  Skin:    General: Skin is warm and dry.     Comments: L ankle wound  Neurological:     Mental Status: She is alert and oriented to person, place, and time.     Sensory: No sensory deficit.  Psychiatric:        Mood and Affect: Mood normal.        Behavior: Behavior normal.        Thought Content: Thought content normal.        Judgment: Judgment normal.     Imaging: CT ABDOMEN PELVIS W CONTRAST Result Date: 01/14/2025 EXAM: CT ABDOMEN AND PELVIS WITH CONTRAST 01/14/2025 03:25:20 PM TECHNIQUE: CT of the abdomen and pelvis was performed with the administration of 80 mL iohexol  (OMNIPAQUE ) 300 MG/ML solution. Multiplanar reformatted images are provided for review. Automated exposure control, iterative reconstruction, and/or weight-based adjustment of the mA/kV was utilized to reduce the radiation  dose to as low as reasonably achievable. COMPARISON: 01/12/2025 CLINICAL HISTORY: RLQ abdominal pain FINDINGS: LOWER CHEST: Similar moderate volume bilateral pleural effusions. Similar appearance of the enhancing ovoid mass in the right lung base in the extrapleural fat measuring 13  x 27 mm. Similar compressive atelectasis also noted in the lung bases. Partially visualized central venous catheter terminating at the cavoatrial junction. LIVER: Unchanged cyst in the left hepatic lobe. Mild dilation of the intrahepatic biliary ducts. GALLBLADDER AND BILE DUCTS: Punctate radiopaque gallstones. No wall thickening. Minimal dilation of the intrahepatic biliary ducts with a 5 mm calculus layering in the distal common bile duct (axial 45). SPLEEN: No acute abnormality. PANCREAS: No acute abnormality. ADRENAL GLANDS: No acute abnormality. KIDNEYS, URETERS AND BLADDER: Similar appearance of multiple bilateral renal cysts. No stones in the kidneys or ureters. No hydronephrosis. No perinephric or periureteral stranding. The urinary bladder is distended without focal abnormality. GI AND BOWEL: Decompressed stomach. Ampullary duodenal diverticulum measuring 1.9 cm. Decompressed appendix with mucosal enhancement. Similar appearance of the peripherally enhancing fluid collection centered about the distal appendix, measuring 3.2 x 4.5 x 3.9 cm. Extensive descending and sigmoid colonic diverticulosis. No changes of acute diverticulitis. No extravasation of enteric contrast to suggest bowel perforation. There is no bowel obstruction. PERITONEUM AND RETROPERITONEUM: No ascites. No free air. Similar appearance of the large multilobular fat containing masses in the presacral space. VASCULATURE: Aorta is normal in caliber. Diffuse aortoiliac atherosclerosis. LYMPH NODES: No lymphadenopathy. REPRODUCTIVE ORGANS: Age related atrophy of the uterus and ovaries. BONES AND SOFT TISSUES: Diffuse osteopenia. Moderate dextral curvature of the thoracolumbar spine with superimposed multilevel degenerative disc disease. Mild anasarca. No focal soft tissue abnormality. IMPRESSION: 1. Stable periappendiceal abscess centered about the distal appendix, measuring 3.2 x 4.5 x 3.9 cm. 2. 5 mm linear calcification again noted in the  distal CBD, most likely reflecting choledocholithiasis. Minimal intrahepatic biliary ductal dilation present, correlation with serum bilirubin recommended. 3. Similar appearance of the moderate bilateral pleural effusions. Electronically signed by: Rogelia Myers MD 01/14/2025 04:05 PM EST RP Workstation: [id number]   CT ABDOMEN PELVIS W CONTRAST Result Date: 01/12/2025 EXAM: CT ABDOMEN AND PELVIS WITH CONTRAST 01/12/2025 12:46:02 PM TECHNIQUE: CT of the abdomen and pelvis was performed with the administration of 100 mL of iohexol  (OMNIPAQUE ) 300 MG/ML solution. Multiplanar reformatted images are provided for review. Automated exposure control, iterative reconstruction, and/or weight-based adjustment of the mA/kV was utilized to reduce the radiation dose to as low as reasonably achievable. COMPARISON: 11/12/2023 CLINICAL HISTORY: Nausea, vomiting, and weakness for 3 days. History of chronic myelomonocytic leukemia. * Tracking Code: BO * FINDINGS: LOWER CHEST: Stable appearance of lower thoracic paraspinal and bilateral posterior pleural based masses, the posterior pleural based mass is a short axis diameter of 1.3 cm, previously 1.2 cm. Small bilateral pleural effusions with passive atelectasis. Cardiomegaly noted with mitral valve calcifications. Emphysema. LIVER: Scattered hypodense hepatic lesions favoring cysts, the largest is in the lateral segment left hepatic lobe with fluid density and measuring 1.5 cm in long axis. GALLBLADDER AND BILE DUCTS: The dependent density in the gallbladder suspicious for small gallstone, image 28 series 2. 5 mm linear calcification posteriorly in the distal common bile duct suspicious for choledocholithiasis, image 33 series 2 and image 80 series 10. The common bile duct measures 6 mm in diameter which is within normal limits and similar to prior. No intrahepatic biliary dilatation. SPLEEN: No acute abnormality. PANCREAS: No acute abnormality. ADRENAL GLANDS: No acute  abnormality. KIDNEYS, URETERS AND BLADDER: In  addition to bilateral Bosniak category 1 renal cysts there are multiple hypodense renal lesions which are likely cysts but are technically too small to characterize. These lesions do not require further imaging workup. No stones in the kidneys or ureters. No hydronephrosis. No perinephric or periureteral stranding. Urinary bladder is unremarkable. GI AND BOWEL: Abnormal pericecal collection in the expected vicinity of the appendix measuring 4.8 x 2.7 x 2.6 cm, not readily separable from the appendix, suspicious for appendicitis possibly with contained periappendiceal abscess. Appendiceal mass is a less likely differential diagnostic consideration given the unremarkable appearance of the appendix on 11/12/2023. Correlate with the patient's symptoms. There is a mild stranding around this lesion indicating inflammation. Sigmoid colon diverticulosis. Scattered diverticula elsewhere in the descending colon. Stomach demonstrates no acute abnormality. There is no bowel obstruction. PERITONEUM AND RETROPERITONEUM: No ascites. No free air. VASCULATURE: Systemic atherosclerosis is present, including the aorta and iliac arteries. Aorta is normal in caliber. LYMPH NODES: No lymphadenopathy. REPRODUCTIVE ORGANS: No acute abnormality. 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 Sclerosis in the sacrum likely attributable to remote sacral insufficiency fracture. Extra convex lower thoracic and upper lumbar scoliosis. Right foraminal impingement of L4-L5 and L5-S1 primarily from spondylosis. IMPRESSION: 1. Abnormal pericecal collection inseparable from the appendix, suspicious for acute appendicitis with contained periappendiceal abscess;  surgical consultation is recommended. 2. Linear calcification in the distal common bile duct suspicious for choledocholithiasis. 3. Cholelithiasis. 4. Similar appearance of presacral fatty masses, favored extramedullary hematopoiesis. 5. Stable lower thoracic paraspinal and bilateral posterior pleural-based masses, likewise favoring extramedullary hematopoiesis. 6. Small bilateral pleural effusions with passive atelectasis. 7. Emphysema. 8. Cardiomegaly with mitral valve calcifications. 9. Sigmoid colon diverticulosis. 10. Systemic atherosclerosis. 11. Sclerosis in the sacrum likely from remote sacral insufficiency fracture. 12. Lower thoracic and upper lumbar scoliosis. 13. Spondylosis with right foraminal impingement at L4-5 and L5-S1. Electronically signed by: Ryan Salvage MD 01/12/2025 02:30 PM EST RP Workstation: [id number]    Labs:  CBC: Recent Labs    12/15/24 1057 01/12/25 1123 01/13/25 0615 01/14/25 0304  WBC 46.9* 84.9* 72.3* 70.3*  HGB 9.5* 10.4* 9.1* 8.6*  HCT 29.3* 32.5* 29.8* 26.8*  PLT 211 172 140* 149*    COAGS: Recent Labs    01/12/25 1123 01/15/25 0830  INR 1.7* 1.8*  APTT 33  --     BMP: Recent Labs    01/12/25 1123 01/13/25 0615 01/14/25 0304 01/15/25 0830  NA 133* 134* 136 136  K 3.9 4.2 3.8 3.8  CL 97* 105 105 107  CO2 23 21* 24 20*  GLUCOSE 124* 73 100* 96  BUN 21 16 17 12   CALCIUM  8.1* 6.5* 6.8* 6.6*  CREATININE 1.07* 0.95 0.98 0.89  GFRNONAA 53* >60 59* >60    LIVER FUNCTION TESTS: Recent Labs    01/12/25 1123 01/13/25 0615 01/14/25 0304 01/15/25 0830  BILITOT 0.6 0.4 0.4 0.4  AST 26 22 19 19   ALT 12 10 10 9   ALKPHOS 105 80 86 82  PROT 6.3* 4.9* 5.0* 5.2*  ALBUMIN 3.5 2.8* 2.9* 2.8*    TUMOR MARKERS: No results for input(s): AFPTM, CEA, CA199, CHROMGRNA in the last 8760 hours.  Assessment and Plan: Patient presented to ED on 1/19 with RLQ abdominal pain, melena, and nausea and vomiting, and admitted with acute  appendicitis with abscess, A-fib with  RVR and possible choledocholithiasis. CT A/P showed acute appendicitis with periappendiceal abscess. General surgery consulted but recommending IR drainage as patient is a high risk surgical candidate with current co-morbidities. IR had deferred drainage request on 1/19 as collection was small and without air, and recommended repeat imaging in a few days. CT A/P w/ cm on 1/21 was performed, and reviewed and approved by Dr. Jennefer for 1/22.   Patient will tentatively presents for scheduled abdominal fluid collection drainage in IR today.  Patient has been NPO since midnight  in anticipation of moderate sedation with IR today. However, due to patient's hypotension overnight and ongoing A-fib with RVR, IR may opt for one-drug sedation once patient is evaluated in department prior to procedure. Patient and her husband are aware.  On 1/19, INR 1.7. Repeat INR this am 1.8. Dr. Jennefer has reviewed labs, and IR will proceed with placement attempt today. 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 All remaining labs and medications are within acceptable parameters.  Allergies reviewed: Digoxin ; Gluten; Penicillins; Fexofenadine; Alendronate; Hydroxychloroquine.   Risks and benefits discussed with the patient including bleeding, infection, damage to adjacent structures, bowel perforation/fistula connection, and sepsis.  All of the patient's questions were answered, patient is agreeable to proceed. Consent signed and in chart.    Thank you for allowing our service to participate in ALIYANAH ROZAS 's care.  Electronically Signed: Laymon Coast, NP   01/15/2025, 9:34 AM     I spent a total of 20 Minutes in face to face in clinical consultation, greater than 50% of which was counseling/coordinating care for  periappendiceal abscess, with consideration for drainage.  "

## 2025-01-15 NOTE — Progress Notes (Signed)
 " PROGRESS NOTE  [name] FMW:[id number] DOB: May 19, 1946   PCP: [name], MD  Patient is from: Home.  Lives with husband.  Uses rolling walker for mobility.  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    In ED, in A-fib with RVR with HR from 120s to 140s.  WBC 84.9 (baseline 40-50).  Hgb 10 (baseline 9-10).  Fecal Hemoccult positive.  CT abdomen and pelvis showed acute appendicitis with periappendiceal abscess, cholelithiasis with linear calcification of the distal CBD suspicious for choledocholithiasis.  General surgery consulted.  Patient was started on IV cefepime  and Flagyl , IV fluids and admitted for further care.  The next day, started on IV Cardizem .  Cardiology consulted.  Repeat CT abdomen and pelvis on 1/21 without significant change.  Surgery consulted IR for possible periappendiceal abscess drainage, also recommended GI consult for anemia and possible choledocholithiasis.   Subjective: Seen and examined earlier this morning.  No major events overnight or this morning.  No complaints.  She says she was not as tender when surgery palpated her abdomen.  Denies chest pain, shortness of breath, dizziness or palpitation.  Heart rate in 150s on bedside monitor.  Husband at bedside.   Assessment and plan: Acute appendicitis with periappendiceal abscess-presents with RLQ pain, nausea and vomiting.  Markedly elevated leukocytosis above baseline.  Repeat CT without significant change. -Continue IV cefepime  and Flagyl . -Deemed to be high  risk for surgery with underlying comorbidity. -General Surgery consulted IR for drainage of periappendiceal abscess   Persistent A-fib with RVR: Remain in RVR despite Cardizem  drip.  Also soft blood pressure.  TTE with LVEF of 50 to 55%, moderate pericardial effusion, large pleural effusion in the left lateral region.  -Cardiology on board-started amiodarone  - Holding Eliquis  due to melanotic stool. - Optimize electrolytes.  Normocytic anemia: Patient reports melanotic stool.  Heme occult positive but H&H relatively at baseline.  Hgb stable after initial drop.  No overt bleeding.  No BM since admission. Recent Labs    06/23/24 1242 08/07/24 1342 09/22/24 1245 11/03/24 1022 11/25/24 0938 12/15/24 1057 01/12/25 1123 01/13/25 0615 01/14/25 0304 01/15/25 0830  HGB 11.0* 10.8* 11.2* 10.3* 10.4* 9.5* 10.4* 9.1* 8.6* 9.1*  -Continue IV Protonix . -Continue holding Eliquis . - GI consulted.  Chronic HFmrEF: TTE  LVEF of 50 to 55% (45 to 50% in 2023), moderate pericardial effusion, large pleural effusion in the left lateral region. Overall, appears euvolemic.  No cardiopulmonary symptoms.  Seems to be on p.o. Lasix  20 mg daily at home. - Continue monitoring fluid status. - Strict intake and output, daily weight - Follow echocardiogram  Moderate pericardial effusion: Noted on echocardiogram.   - Defer to cardiology  Moderate bilateral pleural effusion: Noted on CT abdomen and pelvis.  Echocardiogram also mentioned large left pleural effusion. Patient with history of CHF and anemia and malignancy.  Prior CT in 08/2024 also showed left axillary adenopathy and stable right greater than left paraspinous and pleural-based masses.   Patient denies cardiopulmonary symptoms. - Will consider thoracocentesis at some point  Cholelithiasis with possible choledocholithiasis:  Noted on initial and repeat CT.  LFT within normal.  No RUQ tenderness. - GI consulted  Physical deconditioning: Patient reports  ambulating with walker at baseline. - PT/OT  Chronic COPD: Stable. 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   CMML with elevated WBC:  - Appreciate input by oncology.   Alcohol use: No withdrawal symptoms. - Continue monitoring  Thrombocytopenia: Mild - Continue monitoring  Nutrition Body mass index is 19.87 kg/m. - Will consult dietitian when she started eating        Pressure skin injury: Present on admission. Wound 01/13/25 1900 Pressure Injury Buttocks Left;Lower Stage 1 -  Intact skin with non-blanchable redness of a localized area usually over a bony prominence. (Active)   Pressure skin injury: She has dark blister on right leg. -Consult wound care DVT prophylaxis:  SCDs Start: 01/12/25 1820  Code Status: Full code Family Communication: Updated patient's husband at bedside Level of care: Stepdown Status is: Inpatient Remains inpatient appropriate because: Acute appendicitis with periappendiceal abscess, A-fib with RVR,   Final disposition: Yet to be determined.   55 minutes with more than 50% spent in reviewing records, counseling patient/family and coordinating care.  Consultants:  General Surgery Cardiology Oncology/hematology Gastroenterology  Procedures: None  Microbiology summarized: None  Objective: Vitals:   01/15/25 0700 01/15/25 0800 01/15/25 0900 01/15/25 0907  BP: (!) 95/58 (!) 99/53 (!) 100/55   Pulse: 82 (!) 133 (!) 160   Resp: (!) 26 20    Temp:  98 F (36.7 C)    TempSrc:  Oral    SpO2: (!) 86% 96% 94% 94%  Weight:      Height:        Examination:  GENERAL: No apparent distress.  Nontoxic. HEENT: MMM.  Vision and hearing grossly intact.  NECK: Supple.  No apparent JVD.  RESP:  No IWOB.  Fair aeration bilaterally. CVS: IRR.  HR in 150s. Heart sounds normal.  ABD/GI/GU: BS+.  Mild RLQ tenderness. MSK/EXT:  Moves extremities. No apparent deformity.  Trace BLE edema.   Bloody blister/bulla over LLE. SKIN: As above. NEURO: AA.  Oriented appropriately.  No apparent focal neuro deficit. PSYCH: Calm. Normal affect.   Sch Meds:  Scheduled Meds:  arformoterol   15 mcg Nebulization BID   And   umeclidinium bromide   1 puff Inhalation Daily   Chlorhexidine  Gluconate Cloth  6 each Topical Daily   gabapentin   300 mg Oral Daily   pantoprazole  (PROTONIX ) IV  40 mg Intravenous Q24H   predniSONE   5 mg Oral Q breakfast   sodium chloride  flush  10-40 mL Intracatheter Q12H   Continuous Infusions:  amiodarone  60 mg/hr (01/15/25 1031)   Followed by   amiodarone      ceFEPime  (MAXIPIME ) IV Stopped (01/15/25 0550)   metronidazole  Stopped (01/15/25 0444)   PRN Meds:.acetaminophen  **OR** acetaminophen , morphine  injection, ondansetron  **OR** ondansetron  (ZOFRAN ) IV, mouth rinse  Antimicrobials: Anti-infectives (From admission, onward)    Start     Dose/Rate Route Frequency Ordered Stop   01/14/25 0600  ceFEPIme  (MAXIPIME ) 2 g in sodium chloride  0.9 % 100 mL IVPB        2 g 200 mL/hr over 30 Minutes Intravenous Every 12 hours 01/13/25 1629     01/12/25 1830  metroNIDAZOLE  (FLAGYL ) IVPB 500 mg        500 mg 100 mL/hr over 60 Minutes Intravenous Every 12 hours 01/12/25 1822     01/12/25 1830  ceFEPIme  (  MAXIPIME ) 2 g in sodium chloride  0.9 % 100 mL IVPB  Status:  Discontinued        2 g 200 mL/hr over 30 Minutes Intravenous Every 8 hours 01/12/25 1825 01/13/25 1629   01/12/25 1445  ceFEPIme  (MAXIPIME ) 2 g in sodium chloride  0.9 % 100 mL IVPB       Placed in And Linked Group   2 g 200 mL/hr over 30 Minutes Intravenous  Once 01/12/25 1436 01/12/25 1533   01/12/25 1445  metroNIDAZOLE  (FLAGYL ) IVPB 500 mg       Placed in And Linked Group   500 mg 100 mL/hr over 60 Minutes Intravenous  Once 01/12/25 1436 01/12/25 1606        I have personally reviewed the following labs and images: CBC: Recent Labs  Lab 01/12/25 1123 01/13/25 0615 01/14/25 0304  01/15/25 0830  WBC 84.9* 72.3* 70.3* 77.7*  NEUTROABS  --   --  44.3*  --   HGB 10.4* 9.1* 8.6* 9.1*  HCT 32.5* 29.8* 26.8* 29.9*  MCV 104.8* 108.4* 105.5* 109.5*  PLT 172 140* 149* 150   BMP &GFR Recent Labs  Lab 01/12/25 1123 01/12/25 2349 01/13/25 0615 01/14/25 0304 01/15/25 0830  NA 133*  --  134* 136 136  K 3.9  --  4.2 3.8 3.8  CL 97*  --  105 105 107  CO2 23  --  21* 24 20*  GLUCOSE 124*  --  73 100* 96  BUN 21  --  16 17 12   CREATININE 1.07*  --  0.95 0.98 0.89  CALCIUM  8.1*  --  6.5* 6.8* 6.6*  MG  --  2.4  --  2.2 2.2  PHOS  --   --   --  2.5  --    Estimated Creatinine Clearance: 39.2 mL/min (by C-G formula based on SCr of 0.89 mg/dL). Liver & Pancreas: Recent Labs  Lab 01/12/25 1123 01/13/25 0615 01/14/25 0304 01/15/25 0830  AST 26 22 19 19   ALT 12 10 10 9   ALKPHOS 105 80 86 82  BILITOT 0.6 0.4 0.4 0.4  PROT 6.3* 4.9* 5.0* 5.2*  ALBUMIN 3.5 2.8* 2.9* 2.8*   Recent Labs  Lab 01/12/25 1123  LIPASE 26   No results for input(s): AMMONIA in the last 168 hours. Diabetic: No results for input(s): HGBA1C in the last 72 hours. No results for input(s): GLUCAP in the last 168 hours. Cardiac Enzymes: No results for input(s): CKTOTAL, CKMB, CKMBINDEX, TROPONINI in the last 168 hours. No results for input(s): PROBNP in the last 8760 hours. Coagulation Profile: Recent Labs  Lab 01/12/25 1123 01/15/25 0830  INR 1.7* 1.8*   Thyroid  Function Tests: No results for input(s): TSH, T4TOTAL, FREET4, T3FREE, THYROIDAB in the last 72 hours. Lipid Profile: No results for input(s): CHOL, HDL, LDLCALC, TRIG, CHOLHDL, LDLDIRECT in the last 72 hours. Anemia Panel: No results for input(s): VITAMINB12, FOLATE, FERRITIN, TIBC, IRON, RETICCTPCT in the last 72 hours. Urine analysis:    Component Value Date/Time   COLORURINE YELLOW 01/12/2025 1407   APPEARANCEUR CLEAR 01/12/2025 1407   LABSPEC 1.029 01/12/2025 1407    PHURINE 6.0 01/12/2025 1407   GLUCOSEU NEGATIVE 01/12/2025 1407   HGBUR SMALL (A) 01/12/2025 1407   BILIRUBINUR NEGATIVE 01/12/2025 1407   KETONESUR NEGATIVE 01/12/2025 1407   PROTEINUR NEGATIVE 01/12/2025 1407   UROBILINOGEN 0.2 04/28/2011 1859   NITRITE NEGATIVE 01/12/2025 1407   LEUKOCYTESUR NEGATIVE 01/12/2025 1407   Sepsis Labs: Invalid input(s): PROCALCITONIN, LACTICIDVEN  Microbiology: Recent Results (from the past 240 hours)  MRSA Next Gen by PCR, Nasal     Status: None   Collection Time: 01/13/25  7:03 PM   Specimen: Nasal Mucosa; Nasal Swab  Result Value Ref Range Status   MRSA by PCR Next Gen NOT DETECTED NOT DETECTED Final    Comment: (NOTE) The GeneXpert MRSA Assay (FDA approved for NASAL specimens only), is one component of a comprehensive MRSA colonization surveillance program. It is not intended to diagnose MRSA infection nor to guide or monitor treatment for MRSA infections. Test performance is not FDA approved in patients less than 23 years old. Performed at [facility], [address] [place], [place] [address]     Radiology Studies: ECHOCARDIOGRAM COMPLETE Result Date: 01/15/2025    ECHOCARDIOGRAM REPORT   Patient Name:   MODELL FENDRICK [facility] Date of Exam: 01/15/2025 Medical Rec #:  [id number]        Height:       61.0 in Accession #:    [id number]       Weight:       105.2 lb Date of Birth:  [birth year]       BSA:          1.437 m Patient Age:    78 years         BP:           99/53 mmHg Patient Gender: F                HR:           97 bpm. Exam Location:  Inpatient Procedure: 2D Echo, Cardiac Doppler and Color Doppler (Both Spectral and Color            Flow Doppler were utilized during procedure). Indications:    Atrial Flutter  History:        Patient has prior history of Echocardiogram examinations, most                 recent 03/30/2022. COPD; Arrythmias:Atrial Fibrillation.  Sonographer:    Philomena Daring Referring Phys: [id number] ZANE ADAMS  IMPRESSIONS  1. Left ventricular ejection fraction, by estimation, is 50 to 55%. The left ventricle has low normal function. The left ventricle has no regional wall motion abnormalities. Left ventricular diastolic function could not be evaluated.  2. Right ventricular systolic function is low normal. The right ventricular size is normal. Mildly increased right ventricular wall thickness. There is normal pulmonary artery systolic pressure.  3. Moderate size pericardial effusion (best seen subcostal view, posterior to the LV, respiratory variations across the MV and TV not obtained, IVC is dilated but compressible, not suggestive of tamponade physiology, clinical correlation required). Large pleural effusion in the left lateral region.  4. The mitral valve is degenerative. Mild mitral valve regurgitation. No evidence of mitral stenosis.  5. The aortic valve was not well visualized. Unable to determine aortic valve morphology due to image quality. Aortic valve regurgitation is not visualized. No aortic stenosis is present.  6. The inferior vena cava is dilated in size with >50% respiratory variability, suggesting right atrial pressure of 8 mmHg. Comparison(s): A prior study was performed on 03/30/2022. LVEF 45-50%, RV function mildly reduced, mild/moderate MR, estimated RAP . Pericardial and pleural effusion are new compared to prior study. Conclusion(s)/Recommendation(s): Consider repeat limited echo in 24-48hrs to evaluate the pericaridal effusion and obtain images to evaluate for tamponade physiology if clinically indicated. FINDINGS  Left Ventricle: Left ventricular ejection  fraction, by estimation, is 50 to 55%. The left ventricle has low normal function. The left ventricle has no regional wall motion abnormalities. The left ventricular internal cavity size was small. There is no left ventricular hypertrophy. Left ventricular diastolic function could not be evaluated due to atrial fibrillation. Left  ventricular diastolic function could not be evaluated. Right Ventricle: The right ventricular size is normal. Mildly increased right ventricular wall thickness. Right ventricular systolic function is low normal. There is normal pulmonary artery systolic pressure. The tricuspid regurgitant velocity is 2.63 m/s, and with an assumed right atrial pressure of 8 mmHg, the estimated right ventricular systolic pressure is 35.7 mmHg. Left Atrium: Left atrial size was normal in size. Right Atrium: Right atrial size was normal in size. Pericardium: Moderate size pericardial effusion (best seen subcostal view, posterior to the LV, respiratory variations across the MV and TV not obtained, IVC is dilated but compressible, not suggestive of tamponade physiology, clinical correlation required). Mitral Valve: The mitral valve is degenerative in appearance. Mild mitral valve regurgitation. No evidence of mitral valve stenosis. Tricuspid Valve: The tricuspid valve is grossly normal. Tricuspid valve regurgitation is mild . No evidence of tricuspid stenosis. Aortic Valve: The aortic valve was not well visualized. Aortic valve regurgitation is not visualized. No aortic stenosis is present. Pulmonic Valve: The pulmonic valve was not well visualized. Pulmonic valve regurgitation is not visualized. Aorta: The aortic root and ascending aorta are structurally normal, with no evidence of dilitation. Venous: The inferior vena cava is dilated in size with greater than 50% respiratory variability, suggesting right atrial pressure of 8 mmHg. IAS/Shunts: The atrial septum is grossly normal. Additional Comments: There is a large pleural effusion in the left lateral region.  LEFT VENTRICLE PLAX 2D LVIDd:         3.20 cm   Diastology LVIDs:         2.30 cm   LV e' medial:    8.12 cm/s LV PW:         0.80 cm   LV E/e' medial:  13.6 LV IVS:        0.90 cm   LV e' lateral:   10.47 cm/s LVOT diam:     1.80 cm   LV E/e' lateral: 10.6 LV SV:         39 LV  SV Index:   27 LVOT Area:     2.54 cm  RIGHT VENTRICLE             IVC RV S prime:     10.10 cm/s  IVC diam: 2.10 cm TAPSE (M-mode): 1.4 cm LEFT ATRIUM             Index        RIGHT ATRIUM           Index LA diam:        3.40 cm 2.37 cm/m   RA Area:     17.10 cm LA Vol (A2C):   40.2 ml 27.97 ml/m  RA Volume:   40.30 ml  28.04 ml/m LA Vol (A4C):   38.4 ml 26.72 ml/m LA Biplane Vol: 41.8 ml 29.08 ml/m  AORTIC VALVE LVOT Vmax:   104.00 cm/s LVOT Vmean:  67.000 cm/s LVOT VTI:    0.155 m  AORTA Ao Root diam: 2.30 cm Ao Asc diam:  2.30 cm MITRAL VALVE                TRICUSPID VALVE MV Area (PHT): 8.34 cm  TR Peak grad:   27.7 mmHg MV Decel Time: 91 msec      TR Vmax:        263.00 cm/s MV E velocity: 110.67 cm/s                             SHUNTS                             Systemic VTI:  0.16 m                             Systemic Diam: 1.80 cm Sunit Tolia Electronically signed by Madonna Large Signature Date/Time: 01/15/2025/10:48:44 AM    Final    CT ABDOMEN PELVIS W CONTRAST Result Date: 01/14/2025 EXAM: CT ABDOMEN AND PELVIS WITH CONTRAST 01/14/2025 03:25:20 PM TECHNIQUE: CT of the abdomen and pelvis was performed with the administration of 80 mL iohexol  (OMNIPAQUE ) 300 MG/ML solution. Multiplanar reformatted images are provided for review. Automated exposure control, iterative reconstruction, and/or weight-based adjustment of the mA/kV was utilized to reduce the radiation dose to as low as reasonably achievable. COMPARISON: 01/12/2025 CLINICAL HISTORY: RLQ abdominal pain FINDINGS: LOWER CHEST: Similar moderate volume bilateral pleural effusions. Similar appearance of the enhancing ovoid mass in the right lung base in the extrapleural fat measuring 13 x 27 mm. Similar compressive atelectasis also noted in the lung bases. Partially visualized central venous catheter terminating at the cavoatrial junction. LIVER: Unchanged cyst in the left hepatic lobe. Mild dilation of the intrahepatic biliary ducts.  GALLBLADDER AND BILE DUCTS: Punctate radiopaque gallstones. No wall thickening. Minimal dilation of the intrahepatic biliary ducts with a 5 mm calculus layering in the distal common bile duct (axial 45). SPLEEN: No acute abnormality. PANCREAS: No acute abnormality. ADRENAL GLANDS: No acute abnormality. KIDNEYS, URETERS AND BLADDER: Similar appearance of multiple bilateral renal cysts. No stones in the kidneys or ureters. No hydronephrosis. No perinephric or periureteral stranding. The urinary bladder is distended without focal abnormality. GI AND BOWEL: Decompressed stomach. Ampullary duodenal diverticulum measuring 1.9 cm. Decompressed appendix with mucosal enhancement. Similar appearance of the peripherally enhancing fluid collection centered about the distal appendix, measuring 3.2 x 4.5 x 3.9 cm. Extensive descending and sigmoid colonic diverticulosis. No changes of acute diverticulitis. No extravasation of enteric contrast to suggest bowel perforation. There is no bowel obstruction. PERITONEUM AND RETROPERITONEUM: No ascites. No free air. Similar appearance of the large multilobular fat containing masses in the presacral space. VASCULATURE: Aorta is normal in caliber. Diffuse aortoiliac atherosclerosis. LYMPH NODES: No lymphadenopathy. REPRODUCTIVE ORGANS: Age related atrophy of the uterus and ovaries. BONES AND SOFT TISSUES: Diffuse osteopenia. Moderate dextral curvature of the thoracolumbar spine with superimposed multilevel degenerative disc disease. Mild anasarca. No focal soft tissue abnormality. IMPRESSION: 1. Stable periappendiceal abscess centered about the distal appendix, measuring 3.2 x 4.5 x 3.9 cm. 2. 5 mm linear calcification again noted in the distal CBD, most likely reflecting choledocholithiasis. Minimal intrahepatic biliary ductal dilation present, correlation with serum bilirubin recommended. 3. Similar appearance of the moderate bilateral pleural effusions. Electronically signed by: Rogelia Myers MD 01/14/2025 04:05 PM EST RP Workstation: CARREN Simmer T. Henli Hey [facility]  If 7PM-7AM, please contact night-coverage [email or web address] 01/15/2025, 11:28 AM   "

## 2025-01-15 NOTE — Progress Notes (Signed)
 OT Cancellation Note  Patient Details Name: RENEZMAE CANLAS MRN: [id number] DOB: 20-Jun-1946   Cancelled Treatment:    Reason Eval/Treat Not Completed: Medical issues which prohibited therapy. Pt with low blood pressure and elevated heart rate.   [name], OTR/L 01/15/2025, 2:24 PM

## 2025-01-15 NOTE — Progress Notes (Signed)
 PT Cancellation Note  Patient Details Name: [name] MRN: [id number] DOB: 30-Apr-1946   Cancelled Treatment:    Reason Eval/Treat Not Completed: Medical issues which prohibited therapy, increased HR, low BP.  Started On pressors. [name] PT Acute Rehabilitation Services Office [phone]    [name] 01/15/2025, 11:14 AM

## 2025-01-15 NOTE — Progress Notes (Signed)
 [name]   DOB:01-Jun-1946   FM#:[id number]   RDW#:[id number]  Hem/omc follow up note   Subjective: Patient underwent CT-guided percutaneous right lower quadrant abdominal drain placement by IR this afternoon, she tolerated procedure well, still has pain in the area.  She is also on amiodarone  drip for rapid A-fib.   Objective:  Vitals:   01/15/25 1500 01/15/25 1600  BP: 137/66 (!) 106/52  Pulse: (!) 119 (!) 136  Resp: (!) 30 20  Temp:  98.1 F (36.7 C)  SpO2: 93% 97%    Body mass index is 19.87 kg/m.  Intake/Output Summary (Last 24 hours) at 01/15/2025 1749 Last data filed at 01/15/2025 1601 Gross per 24 hour  Intake 1096.16 ml  Output 950 ml  Net 146.16 ml     Sclerae unicteric  Oropharynx clear  no peripheral edema  Neuro nonfocal    CBG (last 3)  No results for input(s): GLUCAP in the last 72 hours.   Labs:  Lab Results  Component Value Date   WBC 77.7 (HH) 01/15/2025   HGB 9.1 (L) 01/15/2025   HCT 29.9 (L) 01/15/2025   MCV 109.5 (H) 01/15/2025   PLT 150 01/15/2025   NEUTROABS 44.3 (H) 01/14/2025     Urine Studies No results for input(s): UHGB, CRYS in the last 72 hours.  Invalid input(s): UACOL, UAPR, USPG, UPH, UTP, UGL, UKET, UBIL, UNIT, UROB, [place], UEPI, UWBC, CORINN NUMBERS [place], [place], [place]  Basic Metabolic Panel: Recent Labs  Lab 01/12/25 1123 01/12/25 2349 01/13/25 0615 01/14/25 0304 01/15/25 0830  NA 133*  --  134* 136 136  K 3.9  --  4.2 3.8 3.8  CL 97*  --  105 105 107  CO2 23  --  21* 24 20*  GLUCOSE 124*  --  73 100* 96  BUN 21  --  16 17 12   CREATININE 1.07*  --  0.95 0.98 0.89  CALCIUM  8.1*  --  6.5* 6.8* 6.6*  MG  --  2.4  --  2.2 2.2  PHOS  --   --   --  2.5  --    GFR Estimated Creatinine Clearance: 39.2 mL/min (by C-G formula based on SCr of 0.89 mg/dL). Liver Function Tests: Recent Labs  Lab 01/12/25 1123 01/13/25 0615 01/14/25 0304 01/15/25 0830  AST 26 22 19 19   ALT  12 10 10 9   ALKPHOS 105 80 86 82  BILITOT 0.6 0.4 0.4 0.4  PROT 6.3* 4.9* 5.0* 5.2*  ALBUMIN 3.5 2.8* 2.9* 2.8*   Recent Labs  Lab 01/12/25 1123  LIPASE 26   No results for input(s): AMMONIA in the last 168 hours. Coagulation profile Recent Labs  Lab 01/12/25 1123 01/15/25 0830  INR 1.7* 1.8*    CBC: Recent Labs  Lab 01/12/25 1123 01/13/25 0615 01/14/25 0304 01/15/25 0830  WBC 84.9* 72.3* 70.3* 77.7*  NEUTROABS  --   --  44.3*  --   HGB 10.4* 9.1* 8.6* 9.1*  HCT 32.5* 29.8* 26.8* 29.9*  MCV 104.8* 108.4* 105.5* 109.5*  PLT 172 140* 149* 150   Cardiac Enzymes: No results for input(s): CKTOTAL, CKMB, CKMBINDEX, TROPONINI in the last 168 hours. BNP: Invalid input(s): POCBNP CBG: No results for input(s): GLUCAP in the last 168 hours. D-Dimer No results for input(s): DDIMER in the last 72 hours. Hgb A1c No results for input(s): HGBA1C in the last 72 hours. Lipid Profile No results for input(s): CHOL, HDL, LDLCALC, TRIG, CHOLHDL, LDLDIRECT in the last 72 hours.  Thyroid  function studies No results for input(s): TSH, T4TOTAL, T3FREE, THYROIDAB in the last 72 hours.  Invalid input(s): FREET3 Anemia work up No results for input(s): VITAMINB12, FOLATE, FERRITIN, TIBC, IRON, RETICCTPCT in the last 72 hours. Microbiology Recent Results (from the past 240 hours)  MRSA Next Gen by PCR, Nasal     Status: None   Collection Time: 01/13/25  7:03 PM   Specimen: Nasal Mucosa; Nasal Swab  Result Value Ref Range Status   MRSA by PCR Next Gen NOT DETECTED NOT DETECTED Final    Comment: (NOTE) The GeneXpert MRSA Assay (FDA approved for NASAL specimens only), is one component of a comprehensive MRSA colonization surveillance program. It is not intended to diagnose MRSA infection nor to guide or monitor treatment for MRSA infections. Test performance is not FDA approved in patients less than 70 years old. Performed at [facility], [address], [place], [place] [address]   Aerobic/Anaerobic Culture w Gram Stain (surgical/deep wound)     Status: None (Preliminary result)   Collection Time: 01/15/25 12:59 PM   Specimen: Abdomen; Abscess  Result Value Ref Range Status   Specimen Description   Final    ABDOMEN Performed at [facility], [address], [place], [place] [address]    Special Requests   Final    NONE Performed at [facility], [address] [place], [place] [address]    Gram Stain   Final    ABUNDANT WBC PRESENT, PREDOMINANTLY PMN RARE GRAM NEGATIVE RODS Performed at [facility], [address] [place], [place] [address]    Culture PENDING  Incomplete   Report Status PENDING  Incomplete      Studies:  CT GUIDED PERITONEAL/RETROPERITONEAL FLUID DRAIN BY PERC CATH Result Date: 01/15/2025 INDICATION: 79 year old female with right lower quadrant fluid collection concerning for abscess, likely perforated appendicitis. EXAM: CT PERC DRAIN PERITONEAL ABCESS COMPARISON:  01/14/2025 MEDICATIONS: The patient is currently admitted to the hospital and receiving intravenous antibiotics. The antibiotics were administered within an appropriate time frame prior to the initiation of the procedure. ANESTHESIA/SEDATION: Moderate (conscious) sedation was employed during this procedure. A total of Versed  2 mg and Fentanyl  100 mcg was administered intravenously. Moderate Sedation Time: 11 minutes. The patient's level of consciousness and vital signs were monitored continuously by radiology nursing throughout the procedure under my direct supervision. CONTRAST:  None COMPLICATIONS: None immediate. PROCEDURE: RADIATION DOSE REDUCTION: This exam was performed according to the departmental dose-optimization program which includes automated exposure control, adjustment of the mA and/or kV according to patient size and/or use of iterative reconstruction  technique. Informed written consent was obtained from the patient after a discussion of the risks, benefits and alternatives to treatment. The patient was placed supine on the CT gantry and a pre procedural CT was performed re-demonstrating the known abscess/fluid collection within the right lower quadrant. The procedure was planned. A timeout was performed prior to the initiation of the procedure. The right lower quadrant was prepped and draped in the usual sterile fashion. The overlying soft tissues were anesthetized with 1% lidocaine  with epinephrine . Appropriate trajectory was planned with the use of a 22 gauge spinal needle. An 18 gauge trocar needle was advanced into the abscess/fluid collection and a short Amplatz super stiff wire was coiled within the collection. Appropriate positioning was confirmed with a limited CT scan. The tract was serially dilated allowing placement of a 8 French mini loop drainage catheter. Appropriate positioning was  confirmed with a limited postprocedural CT scan. Twenty ml of purulent fluid was aspirated. The tube was connected to a bulb suction and sutured in place. A dressing was placed. The patient tolerated the procedure well without immediate post procedural complication. IMPRESSION: Successful CT guided placement of a 8 French mini loop drain catheter into the periappendiceal abscess with aspiration of 20 mL of purulent fluid. Samples were sent to the laboratory as requested by the ordering clinical team. Ester Sides, MD [facility] Electronically Signed   By: Ester Sides M.D.   On: 01/15/2025 13:47   ECHOCARDIOGRAM COMPLETE Result Date: 01/15/2025    ECHOCARDIOGRAM REPORT   Patient Name:   MICHEAL SHEEN [facility] Date of Exam: 01/15/2025 Medical Rec #:  [id number]        Height:       61.0 in Accession #:    [id number]       Weight:       105.2 lb Date of Birth:  14-Sep-1946       BSA:          1.437 m Patient Age:     78 years         BP:           99/53 mmHg Patient Gender: F                HR:           97 bpm. Exam Location:  Inpatient Procedure: 2D Echo, Cardiac Doppler and Color Doppler (Both Spectral and Color            Flow Doppler were utilized during procedure). Indications:    Atrial Flutter  History:        Patient has prior history of Echocardiogram examinations, most                 recent 03/30/2022. COPD; Arrythmias:Atrial Fibrillation.  Sonographer:    Philomena Daring Referring Phys: [id number] ZANE ADAMS IMPRESSIONS  1. Left ventricular ejection fraction, by estimation, is 50 to 55%. The left ventricle has low normal function. The left ventricle has no regional wall motion abnormalities. Left ventricular diastolic function could not be evaluated.  2. Right ventricular systolic function is low normal. The right ventricular size is normal. Mildly increased right ventricular wall thickness. There is normal pulmonary artery systolic pressure.  3. Moderate size pericardial effusion (best seen subcostal view, posterior to the LV, respiratory variations across the MV and TV not obtained, IVC is dilated but compressible, not suggestive of tamponade physiology, clinical correlation required). Large pleural effusion in the left lateral region.  4. The mitral valve is degenerative. Mild mitral valve regurgitation. No evidence of mitral stenosis.  5. The aortic valve was not well visualized. Unable to determine aortic valve morphology due to image quality. Aortic valve regurgitation is not visualized. No aortic stenosis is present.  6. The inferior vena cava is dilated in size with >50% respiratory variability, suggesting right atrial pressure of 8 mmHg. Comparison(s): A prior study was performed on 03/30/2022. LVEF 45-50%, RV function mildly reduced, mild/moderate MR, estimated RAP . Pericardial and pleural effusion are new compared to prior study. Conclusion(s)/Recommendation(s): Consider repeat limited echo in 24-48hrs to  evaluate the pericaridal effusion and obtain images to evaluate for tamponade physiology if clinically indicated. FINDINGS  Left Ventricle: Left ventricular ejection fraction, by estimation, is 50 to 55%. The left ventricle has low normal function. The left ventricle has no regional wall motion  abnormalities. The left ventricular internal cavity size was small. There is no left ventricular hypertrophy. Left ventricular diastolic function could not be evaluated due to atrial fibrillation. Left ventricular diastolic function could not be evaluated. Right Ventricle: The right ventricular size is normal. Mildly increased right ventricular wall thickness. Right ventricular systolic function is low normal. There is normal pulmonary artery systolic pressure. The tricuspid regurgitant velocity is 2.63 m/s, and with an assumed right atrial pressure of 8 mmHg, the estimated right ventricular systolic pressure is 35.7 mmHg. Left Atrium: Left atrial size was normal in size. Right Atrium: Right atrial size was normal in size. Pericardium: Moderate size pericardial effusion (best seen subcostal view, posterior to the LV, respiratory variations across the MV and TV not obtained, IVC is dilated but compressible, not suggestive of tamponade physiology, clinical correlation required). Mitral Valve: The mitral valve is degenerative in appearance. Mild mitral valve regurgitation. No evidence of mitral valve stenosis. Tricuspid Valve: The tricuspid valve is grossly normal. Tricuspid valve regurgitation is mild . No evidence of tricuspid stenosis. Aortic Valve: The aortic valve was not well visualized. Aortic valve regurgitation is not visualized. No aortic stenosis is present. Pulmonic Valve: The pulmonic valve was not well visualized. Pulmonic valve regurgitation is not visualized. Aorta: The aortic root and ascending aorta are structurally normal, with no evidence of dilitation. Venous: The inferior vena cava is dilated in size with  greater than 50% respiratory variability, suggesting right atrial pressure of 8 mmHg. IAS/Shunts: The atrial septum is grossly normal. Additional Comments: There is a large pleural effusion in the left lateral region.  LEFT VENTRICLE PLAX 2D LVIDd:         3.20 cm   Diastology LVIDs:         2.30 cm   LV e' medial:    8.12 cm/s LV PW:         0.80 cm   LV E/e' medial:  13.6 LV IVS:        0.90 cm   LV e' lateral:   10.47 cm/s LVOT diam:     1.80 cm   LV E/e' lateral: 10.6 LV SV:         39 LV SV Index:   27 LVOT Area:     2.54 cm  RIGHT VENTRICLE             IVC RV S prime:     10.10 cm/s  IVC diam: 2.10 cm TAPSE (M-mode): 1.4 cm LEFT ATRIUM             Index        RIGHT ATRIUM           Index LA diam:        3.40 cm 2.37 cm/m   RA Area:     17.10 cm LA Vol (A2C):   40.2 ml 27.97 ml/m  RA Volume:   40.30 ml  28.04 ml/m LA Vol (A4C):   38.4 ml 26.72 ml/m LA Biplane Vol: 41.8 ml 29.08 ml/m  AORTIC VALVE LVOT Vmax:   104.00 cm/s LVOT Vmean:  67.000 cm/s LVOT VTI:    0.155 m  AORTA Ao Root diam: 2.30 cm Ao Asc diam:  2.30 cm MITRAL VALVE                TRICUSPID VALVE MV Area (PHT): 8.34 cm     TR Peak grad:   27.7 mmHg MV Decel Time: 91 msec      TR Vmax:  263.00 cm/s MV E velocity: 110.67 cm/s                             SHUNTS                             Systemic VTI:  0.16 m                             Systemic Diam: 1.80 cm Sunit Tolia Electronically signed by Madonna Large Signature Date/Time: 01/15/2025/10:48:44 AM    Final    CT ABDOMEN PELVIS W CONTRAST Result Date: 01/14/2025 EXAM: CT ABDOMEN AND PELVIS WITH CONTRAST 01/14/2025 03:25:20 PM TECHNIQUE: CT of the abdomen and pelvis was performed with the administration of 80 mL iohexol  (OMNIPAQUE ) 300 MG/ML solution. Multiplanar reformatted images are provided for review. Automated exposure control, iterative reconstruction, and/or weight-based adjustment of the mA/kV was utilized to reduce the radiation dose to as low as reasonably achievable.  COMPARISON: 01/12/2025 CLINICAL HISTORY: RLQ abdominal pain FINDINGS: LOWER CHEST: Similar moderate volume bilateral pleural effusions. Similar appearance of the enhancing ovoid mass in the right lung base in the extrapleural fat measuring 13 x 27 mm. Similar compressive atelectasis also noted in the lung bases. Partially visualized central venous catheter terminating at the cavoatrial junction. LIVER: Unchanged cyst in the left hepatic lobe. Mild dilation of the intrahepatic biliary ducts. GALLBLADDER AND BILE DUCTS: Punctate radiopaque gallstones. No wall thickening. Minimal dilation of the intrahepatic biliary ducts with a 5 mm calculus layering in the distal common bile duct (axial 45). SPLEEN: No acute abnormality. PANCREAS: No acute abnormality. ADRENAL GLANDS: No acute abnormality. KIDNEYS, URETERS AND BLADDER: Similar appearance of multiple bilateral renal cysts. No stones in the kidneys or ureters. No hydronephrosis. No perinephric or periureteral stranding. The urinary bladder is distended without focal abnormality. GI AND BOWEL: Decompressed stomach. Ampullary duodenal diverticulum measuring 1.9 cm. Decompressed appendix with mucosal enhancement. Similar appearance of the peripherally enhancing fluid collection centered about the distal appendix, measuring 3.2 x 4.5 x 3.9 cm. Extensive descending and sigmoid colonic diverticulosis. No changes of acute diverticulitis. No extravasation of enteric contrast to suggest bowel perforation. There is no bowel obstruction. PERITONEUM AND RETROPERITONEUM: No ascites. No free air. Similar appearance of the large multilobular fat containing masses in the presacral space. VASCULATURE: Aorta is normal in caliber. Diffuse aortoiliac atherosclerosis. LYMPH NODES: No lymphadenopathy. REPRODUCTIVE ORGANS: Age related atrophy of the uterus and ovaries. BONES AND SOFT TISSUES: Diffuse osteopenia. Moderate dextral curvature of the thoracolumbar spine with superimposed  multilevel degenerative disc disease. Mild anasarca. No focal soft tissue abnormality. IMPRESSION: 1. Stable periappendiceal abscess centered about the distal appendix, measuring 3.2 x 4.5 x 3.9 cm. 2. 5 mm linear calcification again noted in the distal CBD, most likely reflecting choledocholithiasis. Minimal intrahepatic biliary ductal dilation present, correlation with serum bilirubin recommended. 3. Similar appearance of the moderate bilateral pleural effusions. Electronically signed by: Rogelia Myers MD 01/14/2025 04:05 PM EST RP Workstation: [id number]    Assessment: 79 y.o. 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 - Her anemia is stable, has not required any blood transfusion. -I also reviewed her CT images -other management per primary, surgical and cardiology teams -I will f/u as needed    Onita Mattock, MD 01/15/2025  5:49 PM

## 2025-01-15 NOTE — Consult Note (Signed)
 [facility] Gastroenterology Consult  Referring Provider: No ref. provider found Primary Care Physician:  [name], MD Primary Gastroenterologist: Drs Kristie and Rollin  Reason for Consultation: Choledocholithiasis, anemia/melena  SUBJECTIVE:   HPI: [name] is a 79 y.o. female with past medical history significant for CML, COPD, celiac disease, hypertension, rheumatoid arthritis. Presented to hospital on 01/13/25 with chief complaint of nausea, vomiting, abdominal pain.   Spouse present at bedside during my evaluation today. Began to have abdominal pain, nausea, vomiting 01/10/25 and 01/11/25. Had never before black colored stool during this time as well. No fevers or chills. No chest pain or shortness of breath.   CTAP showed enhancing ovoid mass in R lung base 13 x 27 mm, punctate cholelithiasis, intrahepatic biliary duct 5 mm, 5 mm calculus in distal common bile duct, ampullary duodenal diverticulum, peripherally enhancing fluid collection in distal appendix 3.2 x 4.5 x 3.9 cm.   She is status post CT guided peritoneal fluid drain on 01/15/25.   Labs AST/ALT 19/9, ALP 82, Tb 0.4, Na 136, K 3.8, WBC 77.7 (baseline typically 30-40), Hgb 9.1 (baseline typically 10-11), PLT 150.   Flexible sigmoidoscopy 01/25/2022 (Dr. Kristie) for rectal bleeding and colon cancer screening - fair prep, sigmoid colon diverticulosis, prominent internal hemorrhoids, procedure aborted at 40 cm.   EGD/COL 09/24/2009 (Dr. Kristie) normal esophagus, moderate diffuse gastritis, blunting of villi in small bowel, small internal hemorrhoids, large sessile polyp at 30 cm removed via hot snare, scattered sigmoid diverticulosis, barotrauma in cecum and right colon.  Past Medical History:  Diagnosis Date   Arthritis    Rheumatoid arthritis   Celiac disease    Chronic kidney disease    stage 3 from MD notes   COPD (chronic obstructive pulmonary disease) (HCC)    Dyspnea    with going up stairs   Family history of adverse  reaction to anesthesia    father had hard time waking up   Headache    sinus headaches   Hot flashes    Hypertension    Iron deficiency anemia    Pneumonia    per patient I have walking pneumonia   Past Surgical History:  Procedure Laterality Date   COLONOSCOPY     ECTOPIC PREGNANCY SURGERY      x 2   IR IMAGING GUIDED PORT INSERTION  01/31/2022   REVERSE SHOULDER ARTHROPLASTY Right 02/02/2017   Procedure: RIGHT REVERSE SHOULDER ARTHROPLASTY;  Surgeon: Marcey Her, MD;  Location: [facility] OR;  Service: Orthopedics;  Laterality: Right;   Prior to Admission medications  Medication Sig Start Date End Date Taking? Authorizing Provider  acetaminophen  (TYLENOL ) 500 MG tablet Take 2 tablets (1,000 mg total) by mouth 3 (three) times daily. Patient taking differently: Take 1,000 mg by mouth daily at 12 noon. 11/14/23  Yes Samtani, Jai-Gurmukh, MD  apixaban  (ELIQUIS ) 5 MG TABS tablet TAKE 1 TABLET(5 MG) BY MOUTH TWICE DAILY 01/06/25  Yes Tolia, Sunit, DO  azaCITIDine  (VIDAZA ) 100 MG SUSR Inject 114 mg into the vein every 6 (six) weeks. 03/29/23  Yes [provider]  CLARITIN  10 MG tablet Take 10 mg by mouth daily.   Yes [provider]  denosumab  (PROLIA ) 60 MG/ML SOSY injection Inject 60 mg into the skin every 6 (six) months.   Yes [provider]  diltiazem  (CARDIZEM  CD) 360 MG 24 hr capsule Take 1 capsule (360 mg total) by mouth daily. 07/21/24  Yes Swinyer, Rosaline HERO, NP  furosemide  (LASIX ) 20 MG tablet Take 20  mg by mouth in the morning.   Yes [provider]  gabapentin  (NEURONTIN ) 300 MG capsule Take 300 mg by mouth in the morning. 07/21/14  Yes [provider]  inFLIXimab  (REMICADE ) 100 MG injection Inject into the vein every 8 (eight) weeks.   Yes [provider]  Metoprolol  Tartrate 75 MG TABS Take 2 tablets (150 mg total) by mouth 2 (two) times daily. 11/19/24  Yes Tolia, Sunit, DO  ondansetron  (ZOFRAN ) 4 MG tablet Take 1 tablet (4 mg total)  by mouth every 8 (eight) hours as needed for nausea or vomiting. 09/23/24  Yes [name], MD  predniSONE  (DELTASONE ) 5 MG tablet Take 5 mg by mouth daily with breakfast. 09/22/21  Yes [provider]  Tiotropium Bromide-Olodaterol (STIOLTO RESPIMAT ) 2.5-2.5 MCG/ACT AERS Inhale 2 puffs into the lungs daily. 05/21/24  Yes Cobb, Comer GAILS, NP  furosemide  (LASIX ) 20 MG tablet Take 1 tablet (20 mg total) by mouth daily. Patient not taking: Reported on 01/12/2025 05/21/22 01/12/25  Antoinette Doe, MD  nitrofurantoin , macrocrystal-monohydrate, (MACROBID ) 100 MG capsule Take 1 capsule (100 mg total) by mouth 2 (two) times daily. Patient not taking: Reported on 01/12/2025 11/25/24   Hanford Powell BRAVO, NP   Current Facility-Administered Medications  Medication Dose Route Frequency Provider Last Rate Last Admin   acetaminophen  (TYLENOL ) tablet 650 mg  650 mg Oral Q6H PRN Krugh, Marissa C, DO       Or   acetaminophen  (TYLENOL ) suppository 650 mg  650 mg Rectal Q6H PRN Krugh, Marissa C, DO       amiodarone  (NEXTERONE  PREMIX) 360-4.14 MG/200ML-% (1.8 mg/mL) IV infusion  60 mg/hr Intravenous Continuous Mona Vinie BROCKS, MD 33.3 mL/hr at 01/15/25 1515 60 mg/hr at 01/15/25 1515   Followed by   amiodarone  (NEXTERONE  PREMIX) 360-4.14 MG/200ML-% (1.8 mg/mL) IV infusion  30 mg/hr Intravenous Continuous Hilty, Vinie BROCKS, MD       arformoterol  (BROVANA ) nebulizer solution 15 mcg  15 mcg Nebulization BID Krugh, Marissa C, DO   15 mcg at 01/14/25 2018   And   umeclidinium bromide  (INCRUSE ELLIPTA ) 62.5 MCG/ACT 1 puff  1 puff Inhalation Daily Krugh, Marissa C, DO   1 puff at 01/15/25 0905   ceFEPIme  (MAXIPIME ) 2 g in sodium chloride  0.9 % 100 mL IVPB  2 g Intravenous Q12H Kathrin Mignon DASEN, MD   Stopped at 01/15/25 0550   Chlorhexidine  Gluconate Cloth 2 % PADS 6 each  6 each Topical Daily Gonfa, Taye T, MD   6 each at 01/14/25 1959   gabapentin  (NEURONTIN ) capsule 300 mg  300 mg Oral Daily Krugh, Marissa C, DO   300 mg  at 01/14/25 9077   metroNIDAZOLE  (FLAGYL ) IVPB 500 mg  500 mg Intravenous Q12H Krugh, Marissa C, DO   Stopped at 01/15/25 1427   morphine  (PF) 4 MG/ML injection 4 mg  4 mg Intravenous Q4H PRN Krugh, Marissa C, DO   4 mg at 01/12/25 1836   ondansetron  (ZOFRAN ) tablet 4 mg  4 mg Oral Q6H PRN Krugh, Marissa C, DO       Or   ondansetron  (ZOFRAN ) injection 4 mg  4 mg Intravenous Q6H PRN Krugh, Marissa C, DO       Oral care mouth rinse  15 mL Mouth Rinse PRN Gonfa, Taye T, MD       pantoprazole  (PROTONIX ) injection 40 mg  40 mg Intravenous Q24H Krugh, Marissa C, DO   40 mg at 01/15/25 1321   predniSONE  (DELTASONE ) tablet 5  mg  5 mg Oral Q breakfast Gonfa, Taye T, MD   5 mg at 01/14/25 0900   sodium chloride  flush (NS) 0.9 % injection 10-40 mL  10-40 mL Intracatheter Q12H Kathrin Simmer T, MD   10 mL at 01/15/25 9188   sodium chloride  flush (NS) 0.9 % injection 5 mL  5 mL Intracatheter Q8H Suttle, Dylan J, MD   5 mL at 01/15/25 1328   Facility-Administered Medications Ordered in Other Encounters  Medication Dose Route Frequency Provider Last Rate Last Admin   acetaminophen  (TYLENOL ) 325 MG tablet            diphenhydrAMINE  (BENADRYL ) 25 mg capsule            Allergies as of 01/12/2025 - Review Complete 01/12/2025  Allergen Reaction Noted   Digoxin  and related Rash and Other (See Comments) 05/23/2022   Gluten meal Diarrhea 01/22/2017   Penicillins Rash and Other (See Comments) 08/03/2014   Fexofenadine Other (See Comments) 03/27/2023   Alendronate Rash 01/22/2017   Hydroxychloroquine Rash 08/15/2016   Family History  Problem Relation Age of Onset   Peripheral Artery Disease Father    Diabetes Father    Social History   Socioeconomic History   Marital status: Married    Spouse name: Not on file   Number of children: Not on file   Years of education: Not on file   Highest education level: Not on file  Occupational History   Not on file  Tobacco Use   Smoking status: Former    Current  packs/day: 0.00    Average packs/day: 1 pack/day for 30.0 years (30.0 ttl pk-yrs)    Types: Cigarettes    Start date: 21    Quit date: 2005    Years since quitting: 21.0   Smokeless tobacco: Never  Vaping Use   Vaping status: Never Used  Substance and Sexual Activity   Alcohol use: Yes    Alcohol/week: 12.0 - 14.0 standard drinks of alcohol    Types: 12 - 14 Glasses of wine per week   Drug use: No   Sexual activity: Not Currently  Other Topics Concern   Not on file  Social History Narrative   Not on file   Social Drivers of Health   Tobacco Use: Medium Risk (01/12/2025)   Patient History    Smoking Tobacco Use: Former    Smokeless Tobacco Use: Never    Passive Exposure: Not on Actuary Strain: Not on file  Food Insecurity: No Food Insecurity (01/13/2025)   Epic    Worried About Programme Researcher, Broadcasting/film/video in the Last Year: Never true    Ran Out of Food in the Last Year: Never true  Transportation Needs: No Transportation Needs (01/13/2025)   Epic    Lack of Transportation (Medical): No    Lack of Transportation (Non-Medical): No  Physical Activity: Not on file  Stress: Not on file  Social Connections: Unknown (01/13/2025)   Social Connection and Isolation Panel    Frequency of Communication with Friends and Family: More than three times a week    Frequency of Social Gatherings with Friends and Family: Twice a week    Attends Religious Services: 1 to 4 times per year    Active Member of Clubs or Organizations: Patient unable to answer    Attends Banker Meetings: Patient unable to answer    Marital Status: Married  Catering Manager Violence: Not At Risk (01/13/2025)   Epic  Fear of Current or Ex-Partner: No    Emotionally Abused: No    Physically Abused: No    Sexually Abused: No  Recent Concern: Intimate Partner Violence - At Risk (12/15/2024)   ACO Reach    Feels Physically and Emotionally Safe: No    Physically Hurt by Someone: No     Humiliated or Emotionally Abused by Someone: No  Depression (PHQ2-9): Low Risk (12/22/2024)   Depression (PHQ2-9)    PHQ-2 Score: 0  Alcohol Screen: Not on file  Housing: Low Risk (01/13/2025)   Epic    Unable to Pay for Housing in the Last Year: No    Number of Times Moved in the Last Year: 0    Homeless in the Last Year: No  Recent Concern: Housing - At Risk (12/15/2024)   ACO Reach    Has Housing: No    Worried About Losing Housing: No    Unable to Get Utilities: No  Utilities: Not At Risk (01/13/2025)   Epic    Threatened with loss of utilities: No  Recent Concern: Utilities - At Risk (12/15/2024)   ACO Reach    Has Housing: No    Worried About Losing Housing: No    Unable to Get Utilities: No  Health Literacy: Not on file   Review of Systems:  Review of Systems  Respiratory:  Negative for shortness of breath.   Cardiovascular:  Negative for chest pain.  Gastrointestinal:  Positive for abdominal pain, melena, nausea and vomiting.    OBJECTIVE:   Temp:  [98 F (36.7 C)-98.6 F (37 C)] 98.1 F (36.7 C) (01/22 1347) Pulse Rate:  [61-160] 126 (01/22 1245) Resp:  [11-28] 14 (01/22 1245) BP: (84-116)/(36-65) 95/56 (01/22 1245) SpO2:  [86 %-99 %] 97 % (01/22 1245) Last BM Date : 01/14/25 Physical Exam Constitutional:      General: She is not in acute distress.    Appearance: She is not ill-appearing, toxic-appearing or diaphoretic.  Cardiovascular:     Rate and Rhythm: Tachycardia present. Rhythm irregular.  Pulmonary:     Effort: No respiratory distress.     Breath sounds: Normal breath sounds.  Abdominal:     General: Bowel sounds are normal. There is no distension.     Palpations: Abdomen is soft.     Tenderness: There is abdominal tenderness.     Comments: JP drain appreciated with sanguinous and purulent fluid.  Skin:    General: Skin is warm and dry.     Coloration: Skin is pale.  Neurological:     Mental Status: She is alert.     Labs: Recent Labs     01/13/25 0615 01/14/25 0304 01/15/25 0830  WBC 72.3* 70.3* 77.7*  HGB 9.1* 8.6* 9.1*  HCT 29.8* 26.8* 29.9*  PLT 140* 149* 150   BMET Recent Labs    01/13/25 0615 01/14/25 0304 01/15/25 0830  NA 134* 136 136  K 4.2 3.8 3.8  CL 105 105 107  CO2 21* 24 20*  GLUCOSE 73 100* 96  BUN 16 17 12   CREATININE 0.95 0.98 0.89  CALCIUM  6.5* 6.8* 6.6*   LFT Recent Labs    01/15/25 0830  PROT 5.2*  ALBUMIN 2.8*  AST 19  ALT 9  ALKPHOS 82  BILITOT 0.4   PT/INR Recent Labs    01/15/25 0830  LABPROT 22.2*  INR 1.8*    Diagnostic imaging: CT GUIDED PERITONEAL/RETROPERITONEAL FLUID DRAIN BY PERC CATH Result Date: 01/15/2025 INDICATION: 79 year old female with right  lower quadrant fluid collection concerning for abscess, likely perforated appendicitis. EXAM: CT PERC DRAIN PERITONEAL ABCESS COMPARISON:  01/14/2025 MEDICATIONS: The patient is currently admitted to the hospital and receiving intravenous antibiotics. The antibiotics were administered within an appropriate time frame prior to the initiation of the procedure. ANESTHESIA/SEDATION: Moderate (conscious) sedation was employed during this procedure. A total of Versed  2 mg and Fentanyl  100 mcg was administered intravenously. Moderate Sedation Time: 11 minutes. The patient's level of consciousness and vital signs were monitored continuously by radiology nursing throughout the procedure under my direct supervision. CONTRAST:  None COMPLICATIONS: None immediate. PROCEDURE: RADIATION DOSE REDUCTION: This exam was performed according to the departmental dose-optimization program which includes automated exposure control, adjustment of the mA and/or kV according to patient size and/or use of iterative reconstruction technique. Informed written consent was obtained from the patient after a discussion of the risks, benefits and alternatives to treatment. The patient was placed supine on the CT gantry and a pre procedural CT was performed  re-demonstrating the known abscess/fluid collection within the right lower quadrant. The procedure was planned. A timeout was performed prior to the initiation of the procedure. The right lower quadrant was prepped and draped in the usual sterile fashion. The overlying soft tissues were anesthetized with 1% lidocaine  with epinephrine . Appropriate trajectory was planned with the use of a 22 gauge spinal needle. An 18 gauge trocar needle was advanced into the abscess/fluid collection and a short Amplatz super stiff wire was coiled within the collection. Appropriate positioning was confirmed with a limited CT scan. The tract was serially dilated allowing placement of a 8 French mini loop drainage catheter. Appropriate positioning was confirmed with a limited postprocedural CT scan. Twenty ml of purulent fluid was aspirated. The tube was connected to a bulb suction and sutured in place. A dressing was placed. The patient tolerated the procedure well without immediate post procedural complication. IMPRESSION: Successful CT guided placement of a 8 French mini loop drain catheter into the periappendiceal abscess with aspiration of 20 mL of purulent fluid. Samples were sent to the laboratory as requested by the ordering clinical team. Ester Sides, MD [facility] Radiology Electronically Signed   By: Ester Sides M.D.   On: 01/15/2025 13:47   ECHOCARDIOGRAM COMPLETE Result Date: 01/15/2025    ECHOCARDIOGRAM REPORT   Patient Name:   BRENLEY PRIORE [facility] Date of Exam: 01/15/2025 Medical Rec #:  [id number]        Height:       61.0 in Accession #:    [id number]       Weight:       105.2 lb Date of Birth:  [birth year]       BSA:          1.437 m Patient Age:    78 years         BP:           99/53 mmHg Patient Gender: F                HR:           97 bpm. Exam Location:  Inpatient Procedure: 2D Echo, Cardiac Doppler and Color Doppler (Both Spectral and Color            Flow  Doppler were utilized during procedure). Indications:    Atrial Flutter  History:        Patient has prior history of Echocardiogram examinations, most  recent 03/30/2022. COPD; Arrythmias:Atrial Fibrillation.  Sonographer:    Philomena Daring Referring Phys: [id number] ZANE ADAMS IMPRESSIONS  1. Left ventricular ejection fraction, by estimation, is 50 to 55%. The left ventricle has low normal function. The left ventricle has no regional wall motion abnormalities. Left ventricular diastolic function could not be evaluated.  2. Right ventricular systolic function is low normal. The right ventricular size is normal. Mildly increased right ventricular wall thickness. There is normal pulmonary artery systolic pressure.  3. Moderate size pericardial effusion (best seen subcostal view, posterior to the LV, respiratory variations across the MV and TV not obtained, IVC is dilated but compressible, not suggestive of tamponade physiology, clinical correlation required). Large pleural effusion in the left lateral region.  4. The mitral valve is degenerative. Mild mitral valve regurgitation. No evidence of mitral stenosis.  5. The aortic valve was not well visualized. Unable to determine aortic valve morphology due to image quality. Aortic valve regurgitation is not visualized. No aortic stenosis is present.  6. The inferior vena cava is dilated in size with >50% respiratory variability, suggesting right atrial pressure of 8 mmHg. Comparison(s): A prior study was performed on 03/30/2022. LVEF 45-50%, RV function mildly reduced, mild/moderate MR, estimated RAP . Pericardial and pleural effusion are new compared to prior study. Conclusion(s)/Recommendation(s): Consider repeat limited echo in 24-48hrs to evaluate the pericaridal effusion and obtain images to evaluate for tamponade physiology if clinically indicated. FINDINGS  Left Ventricle: Left ventricular ejection fraction, by estimation, is 50 to 55%. The left  ventricle has low normal function. The left ventricle has no regional wall motion abnormalities. The left ventricular internal cavity size was small. There is no left ventricular hypertrophy. Left ventricular diastolic function could not be evaluated due to atrial fibrillation. Left ventricular diastolic function could not be evaluated. Right Ventricle: The right ventricular size is normal. Mildly increased right ventricular wall thickness. Right ventricular systolic function is low normal. There is normal pulmonary artery systolic pressure. The tricuspid regurgitant velocity is 2.63 m/s, and with an assumed right atrial pressure of 8 mmHg, the estimated right ventricular systolic pressure is 35.7 mmHg. Left Atrium: Left atrial size was normal in size. Right Atrium: Right atrial size was normal in size. Pericardium: Moderate size pericardial effusion (best seen subcostal view, posterior to the LV, respiratory variations across the MV and TV not obtained, IVC is dilated but compressible, not suggestive of tamponade physiology, clinical correlation required). Mitral Valve: The mitral valve is degenerative in appearance. Mild mitral valve regurgitation. No evidence of mitral valve stenosis. Tricuspid Valve: The tricuspid valve is grossly normal. Tricuspid valve regurgitation is mild . No evidence of tricuspid stenosis. Aortic Valve: The aortic valve was not well visualized. Aortic valve regurgitation is not visualized. No aortic stenosis is present. Pulmonic Valve: The pulmonic valve was not well visualized. Pulmonic valve regurgitation is not visualized. Aorta: The aortic root and ascending aorta are structurally normal, with no evidence of dilitation. Venous: The inferior vena cava is dilated in size with greater than 50% respiratory variability, suggesting right atrial pressure of 8 mmHg. IAS/Shunts: The atrial septum is grossly normal. Additional Comments: There is a large pleural effusion in the left lateral  region.  LEFT VENTRICLE PLAX 2D LVIDd:         3.20 cm   Diastology LVIDs:         2.30 cm   LV e' medial:    8.12 cm/s LV PW:         0.80 cm  LV E/e' medial:  13.6 LV IVS:        0.90 cm   LV e' lateral:   10.47 cm/s LVOT diam:     1.80 cm   LV E/e' lateral: 10.6 LV SV:         39 LV SV Index:   27 LVOT Area:     2.54 cm  RIGHT VENTRICLE             IVC RV S prime:     10.10 cm/s  IVC diam: 2.10 cm TAPSE (M-mode): 1.4 cm LEFT ATRIUM             Index        RIGHT ATRIUM           Index LA diam:        3.40 cm 2.37 cm/m   RA Area:     17.10 cm LA Vol (A2C):   40.2 ml 27.97 ml/m  RA Volume:   40.30 ml  28.04 ml/m LA Vol (A4C):   38.4 ml 26.72 ml/m LA Biplane Vol: 41.8 ml 29.08 ml/m  AORTIC VALVE LVOT Vmax:   104.00 cm/s LVOT Vmean:  67.000 cm/s LVOT VTI:    0.155 m  AORTA Ao Root diam: 2.30 cm Ao Asc diam:  2.30 cm MITRAL VALVE                TRICUSPID VALVE MV Area (PHT): 8.34 cm     TR Peak grad:   27.7 mmHg MV Decel Time: 91 msec      TR Vmax:        263.00 cm/s MV E velocity: 110.67 cm/s                             SHUNTS                             Systemic VTI:  0.16 m                             Systemic Diam: 1.80 cm Sunit Tolia Electronically signed by Madonna Large Signature Date/Time: 01/15/2025/10:48:44 AM    Final    CT ABDOMEN PELVIS W CONTRAST Result Date: 01/14/2025 EXAM: CT ABDOMEN AND PELVIS WITH CONTRAST 01/14/2025 03:25:20 PM TECHNIQUE: CT of the abdomen and pelvis was performed with the administration of 80 mL iohexol  (OMNIPAQUE ) 300 MG/ML solution. Multiplanar reformatted images are provided for review. Automated exposure control, iterative reconstruction, and/or weight-based adjustment of the mA/kV was utilized to reduce the radiation dose to as low as reasonably achievable. COMPARISON: 01/12/2025 CLINICAL HISTORY: RLQ abdominal pain FINDINGS: LOWER CHEST: Similar moderate volume bilateral pleural effusions. Similar appearance of the enhancing ovoid mass in the right lung base in the  extrapleural fat measuring 13 x 27 mm. Similar compressive atelectasis also noted in the lung bases. Partially visualized central venous catheter terminating at the cavoatrial junction. LIVER: Unchanged cyst in the left hepatic lobe. Mild dilation of the intrahepatic biliary ducts. GALLBLADDER AND BILE DUCTS: Punctate radiopaque gallstones. No wall thickening. Minimal dilation of the intrahepatic biliary ducts with a 5 mm calculus layering in the distal common bile duct (axial 45). SPLEEN: No acute abnormality. PANCREAS: No acute abnormality. ADRENAL GLANDS: No acute abnormality. KIDNEYS, URETERS AND BLADDER: Similar appearance of multiple bilateral renal cysts. No stones in the kidneys or  ureters. No hydronephrosis. No perinephric or periureteral stranding. The urinary bladder is distended without focal abnormality. GI AND BOWEL: Decompressed stomach. Ampullary duodenal diverticulum measuring 1.9 cm. Decompressed appendix with mucosal enhancement. Similar appearance of the peripherally enhancing fluid collection centered about the distal appendix, measuring 3.2 x 4.5 x 3.9 cm. Extensive descending and sigmoid colonic diverticulosis. No changes of acute diverticulitis. No extravasation of enteric contrast to suggest bowel perforation. There is no bowel obstruction. PERITONEUM AND RETROPERITONEUM: No ascites. No free air. Similar appearance of the large multilobular fat containing masses in the presacral space. VASCULATURE: Aorta is normal in caliber. Diffuse aortoiliac atherosclerosis. LYMPH NODES: No lymphadenopathy. REPRODUCTIVE ORGANS: Age related atrophy of the uterus and ovaries. BONES AND SOFT TISSUES: Diffuse osteopenia. Moderate dextral curvature of the thoracolumbar spine with superimposed multilevel degenerative disc disease. Mild anasarca. No focal soft tissue abnormality. IMPRESSION: 1. Stable periappendiceal abscess centered about the distal appendix, measuring 3.2 x 4.5 x 3.9 cm. 2. 5 mm linear  calcification again noted in the distal CBD, most likely reflecting choledocholithiasis. Minimal intrahepatic biliary ductal dilation present, correlation with serum bilirubin recommended. 3. Similar appearance of the moderate bilateral pleural effusions. Electronically signed by: Rogelia Myers MD 01/14/2025 04:05 PM EST RP Workstation: [id number]   IMPRESSION: Melena Acute blood loss anemia Atrial fibrillation with rapid ventricular response, on Eliquis  prior to admission  Intra-abdominal abscess, peri-appendiceal   -S/p CT guided drainage 01/15/25 Incidentally noted choledocholithiasis History CML Celiac disease COPD Rheumatoid arthritis   PLAN: - Would benefit from EGD for melena/anemia and ERCP for definitive choledocholith management, upon further review of chart, patient seen by Dr. Kristie in past, will defer timing of procedures to their team, Dr. Rollin to evaluate patient - Trend H/H, transfuse for Hgb < 7 - IV PPI Q12Hr - Hold Eliquis  - Trend liver enzyme profile - [facility] will defer further management to Dr. Sybil group   LOS: 3 days   Estefana Keas, DO [facility]

## 2025-01-15 NOTE — Procedures (Signed)
 Interventional Radiology Procedure Note  Procedure: CT guided right lower quadrant drain placement  Findings: Please refer to procedural dictation for full description. 8 Fr mini loop drain placed in periappendiceal abscess, to bulb suction.  20 mL purulent aspirate sent for culture.  Complications: None immediate  Estimated Blood Loss: < 5 mL  Recommendations: Keep to bulb suction for now. IR will follow.   Ester Sides, MD

## 2025-01-15 NOTE — Plan of Care (Signed)
  Problem: Education: Goal: Knowledge of General Education information will improve Description: Including pain rating scale, medication(s)/side effects and non-pharmacologic comfort measures Outcome: Progressing   Problem: Activity: Goal: Risk for activity intolerance will decrease Outcome: Progressing   Problem: Elimination: Goal: Will not experience complications related to bowel motility Outcome: Progressing   Problem: Pain Managment: Goal: General experience of comfort will improve and/or be controlled Outcome: Progressing   Problem: Safety: Goal: Ability to remain free from injury will improve Outcome: Progressing   Problem: Skin Integrity: Goal: Risk for impaired skin integrity will decrease Outcome: Progressing

## 2025-01-16 DIAGNOSIS — Z7189 Other specified counseling: Secondary | ICD-10-CM

## 2025-01-16 DIAGNOSIS — L8990 Pressure ulcer of unspecified site, unspecified stage: Secondary | ICD-10-CM | POA: Insufficient documentation

## 2025-01-16 DIAGNOSIS — I4891 Unspecified atrial fibrillation: Secondary | ICD-10-CM | POA: Diagnosis not present

## 2025-01-16 DIAGNOSIS — K3580 Unspecified acute appendicitis: Secondary | ICD-10-CM | POA: Diagnosis not present

## 2025-01-16 DIAGNOSIS — K3533 Acute appendicitis with perforation and localized peritonitis, with abscess: Secondary | ICD-10-CM | POA: Diagnosis not present

## 2025-01-16 DIAGNOSIS — I3139 Other pericardial effusion (noninflammatory): Secondary | ICD-10-CM | POA: Diagnosis not present

## 2025-01-16 LAB — RENAL FUNCTION PANEL
Albumin: 2.8 g/dL — ABNORMAL LOW (ref 3.5–5.0)
Anion gap: 9 (ref 5–15)
BUN: 11 mg/dL (ref 8–23)
CO2: 19 mmol/L — ABNORMAL LOW (ref 22–32)
Calcium: 6.6 mg/dL — ABNORMAL LOW (ref 8.9–10.3)
Chloride: 106 mmol/L (ref 98–111)
Creatinine, Ser: 0.99 mg/dL (ref 0.44–1.00)
GFR, Estimated: 58 mL/min — ABNORMAL LOW
Glucose, Bld: 92 mg/dL (ref 70–99)
Phosphorus: 1.4 mg/dL — ABNORMAL LOW (ref 2.5–4.6)
Potassium: 3.8 mmol/L (ref 3.5–5.1)
Sodium: 135 mmol/L (ref 135–145)

## 2025-01-16 LAB — CBC
HCT: 32 % — ABNORMAL LOW (ref 36.0–46.0)
Hemoglobin: 9.7 g/dL — ABNORMAL LOW (ref 12.0–15.0)
MCH: 33.1 pg (ref 26.0–34.0)
MCHC: 30.3 g/dL (ref 30.0–36.0)
MCV: 109.2 fL — ABNORMAL HIGH (ref 80.0–100.0)
Platelets: 172 K/uL (ref 150–400)
RBC: 2.93 MIL/uL — ABNORMAL LOW (ref 3.87–5.11)
RDW: 17.1 % — ABNORMAL HIGH (ref 11.5–15.5)
WBC: 87.6 K/uL (ref 4.0–10.5)
nRBC: 0 % (ref 0.0–0.2)

## 2025-01-16 LAB — MAGNESIUM: Magnesium: 2.1 mg/dL (ref 1.7–2.4)

## 2025-01-16 MED ORDER — CARMEX CLASSIC LIP BALM EX OINT
1.0000 | TOPICAL_OINTMENT | CUTANEOUS | Status: DC | PRN
  Filled 2025-01-16: qty 10

## 2025-01-16 MED ORDER — POLYETHYLENE GLYCOL 3350 17 G PO PACK
17.0000 g | PACK | Freq: Two times a day (BID) | ORAL | Status: DC
  Administered 2025-01-16 – 2025-01-17 (×2): 17 g via ORAL
  Filled 2025-01-16 (×7): qty 1

## 2025-01-16 MED ORDER — METOPROLOL TARTRATE 25 MG PO TABS
25.0000 mg | ORAL_TABLET | Freq: Three times a day (TID) | ORAL | Status: DC
  Administered 2025-01-16 – 2025-01-17 (×5): 25 mg via ORAL
  Filled 2025-01-16 (×6): qty 1

## 2025-01-16 MED ORDER — OXYCODONE HCL 5 MG PO TABS
5.0000 mg | ORAL_TABLET | Freq: Three times a day (TID) | ORAL | Status: DC | PRN
  Administered 2025-01-19: 5 mg via ORAL
  Filled 2025-01-16 (×2): qty 1

## 2025-01-16 MED ORDER — CALCIUM GLUCONATE-NACL 2-0.675 GM/100ML-% IV SOLN
2.0000 g | Freq: Once | INTRAVENOUS | Status: AC
  Administered 2025-01-16: 2000 mg via INTRAVENOUS
  Filled 2025-01-16: qty 100

## 2025-01-16 MED ORDER — PANTOPRAZOLE SODIUM 40 MG IV SOLR
40.0000 mg | Freq: Two times a day (BID) | INTRAVENOUS | Status: DC
  Administered 2025-01-16 – 2025-01-20 (×9): 40 mg via INTRAVENOUS
  Filled 2025-01-16 (×9): qty 10

## 2025-01-16 MED ORDER — SODIUM PHOSPHATES 45 MMOLE/15ML IV SOLN
45.0000 mmol | Freq: Once | INTRAVENOUS | Status: AC
  Administered 2025-01-16: 45 mmol via INTRAVENOUS
  Filled 2025-01-16: qty 15

## 2025-01-16 MED ORDER — ALBUMIN HUMAN 5 % IV SOLN
25.0000 g | Freq: Once | INTRAVENOUS | Status: AC
  Administered 2025-01-16: 25 g via INTRAVENOUS
  Filled 2025-01-16: qty 500

## 2025-01-16 MED ORDER — BOOST PLUS PO LIQD
237.0000 mL | Freq: Two times a day (BID) | ORAL | Status: DC
  Administered 2025-01-16 – 2025-01-19 (×6): 237 mL via ORAL
  Filled 2025-01-16 (×9): qty 237

## 2025-01-16 NOTE — Progress Notes (Addendum)
 " PROGRESS NOTE  [name] FMW:[id number] DOB: [birth year]   PCP: [name], MD  Patient is from: Home.  Lives with husband.  Uses rolling walker for mobility.  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    In ED, in A-fib with RVR with HR from 120s to 140s.  WBC 84.9 (baseline 40-50).  Hgb 10 (baseline 9-10).  Fecal Hemoccult positive.  CT abdomen and pelvis showed acute appendicitis with periappendiceal abscess, cholelithiasis with linear calcification of the distal CBD suspicious for choledocholithiasis.  General surgery consulted.  Patient was started on IV cefepime  and Flagyl , IV fluids and admitted for further care.  The next day, started on IV Cardizem .  Remained in RVR despite IV Cardizem .  Transitioned to IV amiodarone .  Cardiology following.  Repeat CT abdomen and pelvis on 1/21 without significant change.  Surgery consulted IR for possible periappendiceal abscess drainage, also recommended GI consult for anemia and possible choledocholithiasis.  She had drain placed by IR on 1/22.  Abscess culture with rare GNR.  Remains on IV cefepime  and Flagyl .  GI consulted for anemia and possible choledocholithiasis.   Subjective: Seen and examined earlier this morning.  No major events overnight or this morning.  Reports improvement in abdominal pain.  Denies chest pain, dyspnea, dizziness or palpitation.  Main issue today is back pain from being in bed continuously.  She rates her pain 5-7 on a scale of 10.  No radiation to her  legs.   Assessment and plan: Acute appendicitis with periappendiceal abscess-presents with RLQ pain, nausea and vomiting.  Markedly elevated leukocytosis above baseline.  Repeat CT without significant change. -Deemed to be high risk for surgery with underlying comorbidity. -S/p drain placement for periappendiceal abscess on 1/22. -Gram stain from abscess with rare GNR.  Culture pending. -Continue IV cefepime  and Flagyl . -General Surgery and IR following.   Persistent A-fib with RVR: Remain in RVR despite Cardizem  drip.  Also soft blood pressure.  TTE with LVEF of 50 to 55%, moderate pericardial effusion, large pleural effusion in the left lateral region.  -Now on amiodarone  drip and metoprolol  per cardiology. -Holding Eliquis  due to melanotic stool pending GI recs -Optimize electrolytes.  Normocytic anemia: Patient reports melanotic stool.  Heme occult positive but H&H relatively at baseline.  Hgb stable after initial drop.  No overt bleeding.  No BM since admission. Recent Labs    08/07/24 1342 09/22/24 1245 11/03/24 1022 11/25/24 0938 12/15/24 1057 01/12/25 1123 01/13/25 0615 01/14/25 0304 01/15/25 0830 01/16/25 0815  HGB 10.8* 11.2* 10.3* 10.4* 9.5* 10.4* 9.1* 8.6* 9.1* 9.7*  -Continue IV Protonix . -Continue holding Eliquis  until we have clear plan from GI.  Chronic HFmrEF: TTE  LVEF of 50 to 55% (45 to 50% in 2023), moderate pericardial effusion, large pleural effusion in the left lateral region. Overall, appears euvolemic.  No cardiopulmonary symptoms.  Seems to be on p.o. Lasix  20 mg daily at home. - Continue monitoring fluid status. - Strict intake and output, daily weight  Moderate pericardial effusion: Noted on echocardiogram.   - Defer to  cardiology  Moderate bilateral pleural effusion: Noted on CT abdomen and pelvis.  Echocardiogram also mentioned large left pleural effusion. Patient with history of CHF and anemia and malignancy.  Prior CT in 08/2024 also showed left  axillary adenopathy and stable right greater than left paraspinous and pleural-based masses.   Patient denies cardiopulmonary symptoms. - Will consider thoracocentesis at some point or if she becomes symptomatic  Cholelithiasis with possible choledocholithiasis: Noted on initial and repeat CT.  No RUQ tenderness. LFT within normal.  - GI on board.  Physical deconditioning: Patient reports ambulating with walker at baseline. - OOB, PT/OT  Chronic COPD: Stable. 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   CMML with elevated WBC:  - Appreciate input by oncology.   Alcohol use: No withdrawal symptoms. - Continue monitoring  Back pain: Likely from lying in bed continuously - Continue Tylenol  and morphine  injection. - Add p.o. oxycodone  - OOB, PT/OT  Hypocalcemia/hypophosphatemia - Monitor replenish as appropriate  Thrombocytopenia: Mild - Continue monitoring  Advance care planning: Full code with full scope of care.  Not interested in palliative visit.  See IPAL note.   Nutrition Body mass index is 19.87 kg/m. - Consult dietitian.        Pressure skin injury: Present on admission. Wound 01/13/25 1900 Pressure Injury Buttocks Left;Lower Stage 1 -  Intact skin with non-blanchable redness of a localized area usually over a bony prominence. (Active)   Pressure skin injury: She has dark blister on right leg. -Consult wound care DVT prophylaxis:  SCDs Start: 01/12/25 1820  Code Status: Full code Family Communication: Updated patient's husband at bedside Level of care: Stepdown Status is: Inpatient Remains inpatient appropriate because: Acute appendicitis with periappendiceal abscess, A-fib with RVR,   Final disposition: Yet to be determined.   55 minutes with more than 50% spent in reviewing records, counseling patient/family and coordinating care.  Consultants:  General  Surgery Cardiology Oncology/hematology Gastroenterology  Procedures: None  Microbiology summarized: None  Objective: Vitals:   01/16/25 0811 01/16/25 0900 01/16/25 0911 01/16/25 1000  BP: 117/67 113/61  122/86  Pulse: (!) 155 (!) 130  (!) 128  Resp:  17  (!) 21  Temp:      TempSrc:      SpO2:  95% 94% 95%  Weight:      Height:        Examination:  GENERAL: No apparent distress.  Nontoxic. HEENT: MMM.  Vision and hearing grossly intact.  NECK: Supple.  No apparent JVD.  RESP:  No IWOB.  Fair aeration bilaterally. CVS: IRR.  HR in 120s.  Heart sounds normal.  ABD/GI/GU: BS+.  Mild RLQ tenderness.  Drain in place. MSK/EXT:  Moves extremities. No apparent deformity.  Trace BLE edema.  Bloody blister/bulla over LLE. SKIN: As above. NEURO: AA.  Oriented appropriately.  No apparent focal neuro deficit. PSYCH: Calm. Normal affect.   Sch Meds:  Scheduled Meds:  arformoterol   15 mcg Nebulization BID   And   umeclidinium bromide   1 puff Inhalation Daily   Chlorhexidine  Gluconate Cloth  6 each Topical Daily   gabapentin   300 mg Oral Daily   metoprolol  tartrate  25 mg Oral TID   pantoprazole  (PROTONIX ) IV  40 mg Intravenous BID   predniSONE   5 mg Oral Q breakfast   sodium chloride  flush  10-40 mL Intracatheter Q12H   sodium chloride  flush  5 mL Intracatheter Q8H   Continuous Infusions:  amiodarone   60 mg/hr (01/16/25 0853)   calcium  gluconate 2,000 mg (01/16/25 1128)   ceFEPime  (MAXIPIME ) IV Stopped (01/16/25 9361)   metronidazole  Stopped (01/16/25 0424)   sodium phosphate  45 mmol in sodium chloride  0.9 % 250 mL infusion     PRN Meds:.acetaminophen  **OR** acetaminophen , lip balm, morphine  injection, ondansetron  **OR** ondansetron  (ZOFRAN ) IV, mouth rinse, oxyCODONE   Antimicrobials: Anti-infectives (From admission, onward)    Start     Dose/Rate Route Frequency Ordered Stop   01/14/25 0600  ceFEPIme  (MAXIPIME ) 2 g in sodium chloride  0.9 % 100 mL IVPB        2 g 200  mL/hr over 30 Minutes Intravenous Every 12 hours 01/13/25 1629     01/12/25 1830  metroNIDAZOLE  (FLAGYL ) IVPB 500 mg        500 mg 100 mL/hr over 60 Minutes Intravenous Every 12 hours 01/12/25 1822     01/12/25 1830  ceFEPIme  (MAXIPIME ) 2 g in sodium chloride  0.9 % 100 mL IVPB  Status:  Discontinued        2 g 200 mL/hr over 30 Minutes Intravenous Every 8 hours 01/12/25 1825 01/13/25 1629   01/12/25 1445  ceFEPIme  (MAXIPIME ) 2 g in sodium chloride  0.9 % 100 mL IVPB       Placed in And Linked Group   2 g 200 mL/hr over 30 Minutes Intravenous  Once 01/12/25 1436 01/12/25 1533   01/12/25 1445  metroNIDAZOLE  (FLAGYL ) IVPB 500 mg       Placed in And Linked Group   500 mg 100 mL/hr over 60 Minutes Intravenous  Once 01/12/25 1436 01/12/25 1606        I have personally reviewed the following labs and images: CBC: Recent Labs  Lab 01/12/25 1123 01/13/25 0615 01/14/25 0304 01/15/25 0830 01/16/25 0815  WBC 84.9* 72.3* 70.3* 77.7* 87.6*  NEUTROABS  --   --  44.3*  --   --   HGB 10.4* 9.1* 8.6* 9.1* 9.7*  HCT 32.5* 29.8* 26.8* 29.9* 32.0*  MCV 104.8* 108.4* 105.5* 109.5* 109.2*  PLT 172 140* 149* 150 172   BMP &GFR Recent Labs  Lab 01/12/25 1123 01/12/25 2349 01/13/25 0615 01/14/25 0304 01/15/25 0830 01/16/25 0815  NA 133*  --  134* 136 136 135  K 3.9  --  4.2 3.8 3.8 3.8  CL 97*  --  105 105 107 106  CO2 23  --  21* 24 20* 19*  GLUCOSE 124*  --  73 100* 96 92  BUN 21  --  16 17 12 11   CREATININE 1.07*  --  0.95 0.98 0.89 0.99  CALCIUM  8.1*  --  6.5* 6.8* 6.6* 6.6*  MG  --  2.4  --  2.2 2.2 2.1  PHOS  --   --   --  2.5  --  1.4*   Estimated Creatinine Clearance: 35.3 mL/min (by C-G formula based on SCr of 0.99 mg/dL). Liver & Pancreas: Recent Labs  Lab 01/12/25 1123 01/13/25 0615 01/14/25 0304 01/15/25 0830 01/16/25 0815  AST 26 22 19 19   --   ALT 12 10 10 9   --   ALKPHOS 105 80 86 82  --   BILITOT 0.6 0.4 0.4 0.4  --   PROT 6.3* 4.9* 5.0* 5.2*  --    ALBUMIN 3.5 2.8* 2.9* 2.8* 2.8*   Recent Labs  Lab 01/12/25 1123  LIPASE 26   No results for input(s): AMMONIA in the last 168 hours. Diabetic: No results for input(s): HGBA1C in  the last 72 hours. No results for input(s): GLUCAP in the last 168 hours. Cardiac Enzymes: No results for input(s): CKTOTAL, CKMB, CKMBINDEX, TROPONINI in the last 168 hours. No results for input(s): PROBNP in the last 8760 hours. Coagulation Profile: Recent Labs  Lab 01/12/25 1123 01/15/25 0830  INR 1.7* 1.8*   Thyroid  Function Tests: No results for input(s): TSH, T4TOTAL, FREET4, T3FREE, THYROIDAB in the last 72 hours. Lipid Profile: No results for input(s): CHOL, HDL, LDLCALC, TRIG, CHOLHDL, LDLDIRECT in the last 72 hours. Anemia Panel: No results for input(s): VITAMINB12, FOLATE, FERRITIN, TIBC, IRON, RETICCTPCT in the last 72 hours. Urine analysis:    Component Value Date/Time   COLORURINE YELLOW 01/12/2025 1407   APPEARANCEUR CLEAR 01/12/2025 1407   LABSPEC 1.029 01/12/2025 1407   PHURINE 6.0 01/12/2025 1407   GLUCOSEU NEGATIVE 01/12/2025 1407   HGBUR SMALL (A) 01/12/2025 1407   BILIRUBINUR NEGATIVE 01/12/2025 1407   KETONESUR NEGATIVE 01/12/2025 1407   PROTEINUR NEGATIVE 01/12/2025 1407   UROBILINOGEN 0.2 04/28/2011 1859   NITRITE NEGATIVE 01/12/2025 1407   LEUKOCYTESUR NEGATIVE 01/12/2025 1407   Sepsis Labs: Invalid input(s): PROCALCITONIN, LACTICIDVEN  Microbiology: Recent Results (from the past 240 hours)  MRSA Next Gen by PCR, Nasal     Status: None   Collection Time: 01/13/25  7:03 PM   Specimen: Nasal Mucosa; Nasal Swab  Result Value Ref Range Status   MRSA by PCR Next Gen NOT DETECTED NOT DETECTED Final    Comment: (NOTE) The GeneXpert MRSA Assay (FDA approved for NASAL specimens only), is one component of a comprehensive MRSA colonization surveillance program. It is not intended to diagnose MRSA infection nor to  guide or monitor treatment for MRSA infections. Test performance is not FDA approved in patients less than 72 years old. Performed at [facility], [address], [place], [place] [address]   Aerobic/Anaerobic Culture w Gram Stain (surgical/deep wound)     Status: None (Preliminary result)   Collection Time: 01/15/25 12:59 PM   Specimen: Abdomen; Abscess  Result Value Ref Range Status   Specimen Description   Final    ABDOMEN Performed at [facility], [address] [place], [place] [address]    Special Requests   Final    NONE Performed at [facility], [address], [place], [place] [address]    Gram Stain   Final    ABUNDANT WBC PRESENT, PREDOMINANTLY PMN RARE GRAM NEGATIVE RODS Performed at [facility], [address] [place], [place] [address]    Culture PENDING  Incomplete   Report Status PENDING  Incomplete    Radiology Studies: CT GUIDED PERITONEAL/RETROPERITONEAL FLUID DRAIN BY PERC CATH Result Date: 01/15/2025 INDICATION: 79 year old female with right lower quadrant fluid collection concerning for abscess, likely perforated appendicitis. EXAM: CT PERC DRAIN PERITONEAL ABCESS COMPARISON:  01/14/2025 MEDICATIONS: The patient is currently admitted to the hospital and receiving intravenous antibiotics. The antibiotics were administered within an appropriate time frame prior to the initiation of the procedure. ANESTHESIA/SEDATION: Moderate (conscious) sedation was employed during this procedure. A total of Versed  2 mg and Fentanyl  100 mcg was administered intravenously. Moderate Sedation Time: 11 minutes. The patient's level of consciousness and vital signs were monitored continuously by radiology nursing throughout the procedure under my direct supervision. CONTRAST:  None COMPLICATIONS: None immediate. PROCEDURE: RADIATION DOSE REDUCTION: This exam was performed according to the departmental dose-optimization  program which includes automated exposure control, adjustment of the mA and/or kV according  to patient size and/or use of iterative reconstruction technique. Informed written consent was obtained from the patient after a discussion of the risks, benefits and alternatives to treatment. The patient was placed supine on the CT gantry and a pre procedural CT was performed re-demonstrating the known abscess/fluid collection within the right lower quadrant. The procedure was planned. A timeout was performed prior to the initiation of the procedure. The right lower quadrant was prepped and draped in the usual sterile fashion. The overlying soft tissues were anesthetized with 1% lidocaine  with epinephrine . Appropriate trajectory was planned with the use of a 22 gauge spinal needle. An 18 gauge trocar needle was advanced into the abscess/fluid collection and a short Amplatz super stiff wire was coiled within the collection. Appropriate positioning was confirmed with a limited CT scan. The tract was serially dilated allowing placement of a 8 French mini loop drainage catheter. Appropriate positioning was confirmed with a limited postprocedural CT scan. Twenty ml of purulent fluid was aspirated. The tube was connected to a bulb suction and sutured in place. A dressing was placed. The patient tolerated the procedure well without immediate post procedural complication. IMPRESSION: Successful CT guided placement of a 8 French mini loop drain catheter into the periappendiceal abscess with aspiration of 20 mL of purulent fluid. Samples were sent to the laboratory as requested by the ordering clinical team. Ester Sides, MD [facility] Electronically Signed   By: Ester Sides M.D.   On: 01/15/2025 13:47       Elyan Vanwieren T. Imelda Dandridge [facility]  If 7PM-7AM, please contact night-coverage [email or web address] 01/16/2025, 11:46 AM   "

## 2025-01-16 NOTE — Progress Notes (Signed)
 OT Cancellation Note  Patient Details Name: [name] MRN: [id number] DOB: October 01, 1946   Cancelled Treatment:    Reason Eval/Treat Not Completed: Medical issues which prohibited therapy. Medical hold due pt with ongoing a fib.   [name], OTR/L 01/16/2025, 9:26 AM

## 2025-01-16 NOTE — Progress Notes (Signed)
 Initial Nutrition Assessment  DOCUMENTATION CODES:   Non-severe (moderate) malnutrition in context of chronic illness  INTERVENTION:  - Gluten Free diet - patient reports having celiac disease. - Boost Plus po TID, each supplement provides 360 kcal and 14 grams of protein - Encourage intake at all meals and of supplements.  - Monitor weight trends.   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  GOAL:   Patient will meet greater than or equal to 90% of their needs  MONITOR:   PO intake, Supplement acceptance, Weight trends  REASON FOR ASSESSMENT:   Consult Assessment of nutrition requirement/status  ASSESSMENT:   79 y.o. 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 Admitted for acute appendicitis with periappendiceal abscess.   Patient reports her weight has been stable over the past year. EMR confirms weight has remained stable between 103-108# over the past year.   She endorses loving to eat great food and was eating well until her symptoms started that caused her to come into the hospital.  Typically eats 3 full meals a day and follows a gluten free diet as she reports she has Celiac disease. She also consumes 1 Boost Plus daily at home for extra protein.   Current appetite is good and patient reports eating all her breakfast besides a small amount of eggs. Patient originally on a DYS 3 diet for tolerance but getting foods she cannot eat. Discussed with MD and diet changed to gluten free. Will also add assistance with ordering so she has help knowing what foods she can order off the limited gluten free diet.  Patient agreeable to receive Boost Plus during admission to support  intake.   Medications reviewed and include: -  Labs reviewed:  Magnesium  1.4   NUTRITION - FOCUSED PHYSICAL EXAM:  Flowsheet Row Most Recent Value  Orbital Region Mild depletion  Upper Arm Region Moderate depletion  Thoracic and Lumbar Region Unable to assess  Buccal Region No depletion  Temple Region No depletion  Clavicle Bone Region Mild depletion  Clavicle and Acromion Bone Region Moderate depletion  Scapular Bone Region Unable to assess  Dorsal Hand Moderate depletion  Patellar Region Moderate depletion  Anterior Thigh Region Moderate depletion  Posterior Calf Region Moderate depletion  Edema (RD Assessment) None  Hair Reviewed  Eyes Reviewed  Mouth Reviewed  Skin Reviewed  Nails Reviewed    Diet Order:   Diet Order             Diet gluten free Room service appropriate? Yes; Fluid consistency: Thin; Fluid restriction: 1500 mL Fluid  Diet effective now                   EDUCATION NEEDS:  Education needs have been addressed  Skin:  Skin Assessment: Skin Integrity Issues: Skin Integrity Issues:: Stage I Stage I: Left Buttocks  Last BM:  1/21  Height:  Ht Readings from Last 1 Encounters:  01/13/25 5' 1 (1.549 m)   Weight:  Wt Readings from Last 1 Encounters:  01/13/25 47.7 kg   BMI:  Body mass index is 19.87 kg/m.  Estimated Nutritional Needs:  Kcal:  1550-1700 kcals Protein:  65-75 grams Fluid:  >/= 1.6L    Trude Ned RD,  LDN Contact via Secure Chat.

## 2025-01-16 NOTE — Progress Notes (Signed)
 Subjective: No complaints.  No reports of any melena or bowel movements.  Objective: Vital signs in last 24 hours: Temp:  [97.9 F (36.6 C)-98.5 F (36.9 C)] 98.5 F (36.9 C) (01/23 0800) Pulse Rate:  [92-155] 155 (01/23 0811) Resp:  [13-30] 17 (01/23 0700) BP: (87-138)/(48-80) 117/67 (01/23 0811) SpO2:  [93 %-100 %] 94 % (01/23 0911) Last BM Date : 01/14/25  Intake/Output from previous day: 01/22 0701 - 01/23 0700 In: 950.3 [I.V.:583.9; IV Piggyback:366.4] Out: 320 [Urine:300; Drains:20] Intake/Output this shift: Total I/O In: 98 [I.V.:46.6; IV Piggyback:51.4] Out: -   General appearance: alert and no distress GI: soft, non-tender; bowel sounds normal; no masses,  no organomegaly  Lab Results: Recent Labs    01/14/25 0304 01/15/25 0830 01/16/25 0815  WBC 70.3* 77.7* 87.6*  HGB 8.6* 9.1* 9.7*  HCT 26.8* 29.9* 32.0*  PLT 149* 150 172   BMET Recent Labs    01/14/25 0304 01/15/25 0830 01/16/25 0815  NA 136 136 135  K 3.8 3.8 3.8  CL 105 107 106  CO2 24 20* 19*  GLUCOSE 100* 96 92  BUN 17 12 11   CREATININE 0.98 0.89 0.99  CALCIUM  6.8* 6.6* 6.6*   LFT Recent Labs    01/15/25 0830 01/16/25 0815  PROT 5.2*  --   ALBUMIN 2.8* 2.8*  AST 19  --   ALT 9  --   ALKPHOS 82  --   BILITOT 0.4  --    PT/INR Recent Labs    01/15/25 0830  LABPROT 22.2*  INR 1.8*   Hepatitis Panel No results for input(s): HEPBSAG, HCVAB, HEPAIGM, HEPBIGM in the last 72 hours. C-Diff No results for input(s): CDIFFTOX in the last 72 hours. Fecal Lactopherrin No results for input(s): FECLLACTOFRN in the last 72 hours.  Studies/Results: CT GUIDED PERITONEAL/RETROPERITONEAL FLUID DRAIN BY PERC CATH Result Date: 01/15/2025 INDICATION: 79 year old female with right lower quadrant fluid collection concerning for abscess, likely perforated appendicitis. EXAM: CT PERC DRAIN PERITONEAL ABCESS COMPARISON:  01/14/2025 MEDICATIONS: The patient is currently admitted to the  hospital and receiving intravenous antibiotics. The antibiotics were administered within an appropriate time frame prior to the initiation of the procedure. ANESTHESIA/SEDATION: Moderate (conscious) sedation was employed during this procedure. A total of Versed  2 mg and Fentanyl  100 mcg was administered intravenously. Moderate Sedation Time: 11 minutes. The patient's level of consciousness and vital signs were monitored continuously by radiology nursing throughout the procedure under my direct supervision. CONTRAST:  None COMPLICATIONS: None immediate. PROCEDURE: RADIATION DOSE REDUCTION: This exam was performed according to the departmental dose-optimization program which includes automated exposure control, adjustment of the mA and/or kV according to patient size and/or use of iterative reconstruction technique. Informed written consent was obtained from the patient after a discussion of the risks, benefits and alternatives to treatment. The patient was placed supine on the CT gantry and a pre procedural CT was performed re-demonstrating the known abscess/fluid collection within the right lower quadrant. The procedure was planned. A timeout was performed prior to the initiation of the procedure. The right lower quadrant was prepped and draped in the usual sterile fashion. The overlying soft tissues were anesthetized with 1% lidocaine  with epinephrine . Appropriate trajectory was planned with the use of a 22 gauge spinal needle. An 18 gauge trocar needle was advanced into the abscess/fluid collection and a short Amplatz super stiff wire was coiled within the collection. Appropriate positioning was confirmed with a limited CT scan. The tract was serially dilated allowing placement of  a 8 French mini loop drainage catheter. Appropriate positioning was confirmed with a limited postprocedural CT scan. Twenty ml of purulent fluid was aspirated. The tube was connected to a bulb suction and sutured in place. A dressing was  placed. The patient tolerated the procedure well without immediate post procedural complication. IMPRESSION: Successful CT guided placement of a 8 French mini loop drain catheter into the periappendiceal abscess with aspiration of 20 mL of purulent fluid. Samples were sent to the laboratory as requested by the ordering clinical team. Ester Sides, MD [facility] Electronically Signed   By: Ester Sides M.D.   On: 01/15/2025 13:47   ECHOCARDIOGRAM COMPLETE Result Date: 01/15/2025    ECHOCARDIOGRAM REPORT   Patient Name:   [name] [facility] Date of Exam: 01/15/2025 Medical Rec #:  [id number]        Height:       61.0 in Accession #:    [id number]       Weight:       105.2 lb Date of Birth:  [birth year]       BSA:          1.437 m Patient Age:    79 years         BP:           99/53 mmHg Patient Gender: F                HR:           97 bpm. Exam Location:  Inpatient Procedure: 2D Echo, Cardiac Doppler and Color Doppler (Both Spectral and Color            Flow Doppler were utilized during procedure). Indications:    Atrial Flutter  History:        Patient has prior history of Echocardiogram examinations, most                 recent 03/30/2022. COPD; Arrythmias:Atrial Fibrillation.  Sonographer:    Philomena Daring Referring Phys: [id number] ZANE ADAMS IMPRESSIONS  1. Left ventricular ejection fraction, by estimation, is 50 to 55%. The left ventricle has low normal function. The left ventricle has no regional wall motion abnormalities. Left ventricular diastolic function could not be evaluated.  2. Right ventricular systolic function is low normal. The right ventricular size is normal. Mildly increased right ventricular wall thickness. There is normal pulmonary artery systolic pressure.  3. Moderate size pericardial effusion (best seen subcostal view, posterior to the LV, respiratory variations across the MV and TV not obtained, IVC is dilated but compressible, not  suggestive of tamponade physiology, clinical correlation required). Large pleural effusion in the left lateral region.  4. The mitral valve is degenerative. Mild mitral valve regurgitation. No evidence of mitral stenosis.  5. The aortic valve was not well visualized. Unable to determine aortic valve morphology due to image quality. Aortic valve regurgitation is not visualized. No aortic stenosis is present.  6. The inferior vena cava is dilated in size with >50% respiratory variability, suggesting right atrial pressure of 8 mmHg. Comparison(s): A prior study was performed on 03/30/2022. LVEF 45-50%, RV function mildly reduced, mild/moderate MR, estimated RAP . Pericardial and pleural effusion are new compared to prior study. Conclusion(s)/Recommendation(s): Consider repeat limited echo in 24-48hrs to evaluate the pericaridal effusion and obtain images to evaluate for tamponade physiology if clinically indicated. FINDINGS  Left Ventricle: Left ventricular ejection fraction, by estimation, is 50 to 55%. The left ventricle has low  normal function. The left ventricle has no regional wall motion abnormalities. The left ventricular internal cavity size was small. There is no left ventricular hypertrophy. Left ventricular diastolic function could not be evaluated due to atrial fibrillation. Left ventricular diastolic function could not be evaluated. Right Ventricle: The right ventricular size is normal. Mildly increased right ventricular wall thickness. Right ventricular systolic function is low normal. There is normal pulmonary artery systolic pressure. The tricuspid regurgitant velocity is 2.63 m/s, and with an assumed right atrial pressure of 8 mmHg, the estimated right ventricular systolic pressure is 35.7 mmHg. Left Atrium: Left atrial size was normal in size. Right Atrium: Right atrial size was normal in size. Pericardium: Moderate size pericardial effusion (best seen subcostal view, posterior to the LV,  respiratory variations across the MV and TV not obtained, IVC is dilated but compressible, not suggestive of tamponade physiology, clinical correlation required). Mitral Valve: The mitral valve is degenerative in appearance. Mild mitral valve regurgitation. No evidence of mitral valve stenosis. Tricuspid Valve: The tricuspid valve is grossly normal. Tricuspid valve regurgitation is mild . No evidence of tricuspid stenosis. Aortic Valve: The aortic valve was not well visualized. Aortic valve regurgitation is not visualized. No aortic stenosis is present. Pulmonic Valve: The pulmonic valve was not well visualized. Pulmonic valve regurgitation is not visualized. Aorta: The aortic root and ascending aorta are structurally normal, with no evidence of dilitation. Venous: The inferior vena cava is dilated in size with greater than 50% respiratory variability, suggesting right atrial pressure of 8 mmHg. IAS/Shunts: The atrial septum is grossly normal. Additional Comments: There is a large pleural effusion in the left lateral region.  LEFT VENTRICLE PLAX 2D LVIDd:         3.20 cm   Diastology LVIDs:         2.30 cm   LV e' medial:    8.12 cm/s LV PW:         0.80 cm   LV E/e' medial:  13.6 LV IVS:        0.90 cm   LV e' lateral:   10.47 cm/s LVOT diam:     1.80 cm   LV E/e' lateral: 10.6 LV SV:         39 LV SV Index:   27 LVOT Area:     2.54 cm  RIGHT VENTRICLE             IVC RV S prime:     10.10 cm/s  IVC diam: 2.10 cm TAPSE (M-mode): 1.4 cm LEFT ATRIUM             Index        RIGHT ATRIUM           Index LA diam:        3.40 cm 2.37 cm/m   RA Area:     17.10 cm LA Vol (A2C):   40.2 ml 27.97 ml/m  RA Volume:   40.30 ml  28.04 ml/m LA Vol (A4C):   38.4 ml 26.72 ml/m LA Biplane Vol: 41.8 ml 29.08 ml/m  AORTIC VALVE LVOT Vmax:   104.00 cm/s LVOT Vmean:  67.000 cm/s LVOT VTI:    0.155 m  AORTA Ao Root diam: 2.30 cm Ao Asc diam:  2.30 cm MITRAL VALVE                TRICUSPID VALVE MV Area (PHT): 8.34 cm     TR Peak  grad:   27.7 mmHg MV Decel  Time: 91 msec      TR Vmax:        263.00 cm/s MV E velocity: 110.67 cm/s                             SHUNTS                             Systemic VTI:  0.16 m                             Systemic Diam: 1.80 cm Sunit Tolia Electronically signed by Madonna Large Signature Date/Time: 01/15/2025/10:48:44 AM    Final    CT ABDOMEN PELVIS W CONTRAST Result Date: 01/14/2025 EXAM: CT ABDOMEN AND PELVIS WITH CONTRAST 01/14/2025 03:25:20 PM TECHNIQUE: CT of the abdomen and pelvis was performed with the administration of 80 mL iohexol  (OMNIPAQUE ) 300 MG/ML solution. Multiplanar reformatted images are provided for review. Automated exposure control, iterative reconstruction, and/or weight-based adjustment of the mA/kV was utilized to reduce the radiation dose to as low as reasonably achievable. COMPARISON: 01/12/2025 CLINICAL HISTORY: RLQ abdominal pain FINDINGS: LOWER CHEST: Similar moderate volume bilateral pleural effusions. Similar appearance of the enhancing ovoid mass in the right lung base in the extrapleural fat measuring 13 x 27 mm. Similar compressive atelectasis also noted in the lung bases. Partially visualized central venous catheter terminating at the cavoatrial junction. LIVER: Unchanged cyst in the left hepatic lobe. Mild dilation of the intrahepatic biliary ducts. GALLBLADDER AND BILE DUCTS: Punctate radiopaque gallstones. No wall thickening. Minimal dilation of the intrahepatic biliary ducts with a 5 mm calculus layering in the distal common bile duct (axial 45). SPLEEN: No acute abnormality. PANCREAS: No acute abnormality. ADRENAL GLANDS: No acute abnormality. KIDNEYS, URETERS AND BLADDER: Similar appearance of multiple bilateral renal cysts. No stones in the kidneys or ureters. No hydronephrosis. No perinephric or periureteral stranding. The urinary bladder is distended without focal abnormality. GI AND BOWEL: Decompressed stomach. Ampullary duodenal diverticulum measuring 1.9 cm.  Decompressed appendix with mucosal enhancement. Similar appearance of the peripherally enhancing fluid collection centered about the distal appendix, measuring 3.2 x 4.5 x 3.9 cm. Extensive descending and sigmoid colonic diverticulosis. No changes of acute diverticulitis. No extravasation of enteric contrast to suggest bowel perforation. There is no bowel obstruction. PERITONEUM AND RETROPERITONEUM: No ascites. No free air. Similar appearance of the large multilobular fat containing masses in the presacral space. VASCULATURE: Aorta is normal in caliber. Diffuse aortoiliac atherosclerosis. LYMPH NODES: No lymphadenopathy. REPRODUCTIVE ORGANS: Age related atrophy of the uterus and ovaries. BONES AND SOFT TISSUES: Diffuse osteopenia. Moderate dextral curvature of the thoracolumbar spine with superimposed multilevel degenerative disc disease. Mild anasarca. No focal soft tissue abnormality. IMPRESSION: 1. Stable periappendiceal abscess centered about the distal appendix, measuring 3.2 x 4.5 x 3.9 cm. 2. 5 mm linear calcification again noted in the distal CBD, most likely reflecting choledocholithiasis. Minimal intrahepatic biliary ductal dilation present, correlation with serum bilirubin recommended. 3. Similar appearance of the moderate bilateral pleural effusions. Electronically signed by: Rogelia Myers MD 01/14/2025 04:05 PM EST RP Workstation: [id number]    Medications: Scheduled:  arformoterol   15 mcg Nebulization BID   And   umeclidinium bromide   1 puff Inhalation Daily   Chlorhexidine  Gluconate Cloth  6 each Topical Daily   gabapentin   300 mg Oral Daily   metoprolol  tartrate  25 mg Oral  TID   pantoprazole  (PROTONIX ) IV  40 mg Intravenous BID   predniSONE   5 mg Oral Q breakfast   sodium chloride  flush  10-40 mL Intracatheter Q12H   sodium chloride  flush  5 mL Intracatheter Q8H   Continuous:  amiodarone  60 mg/hr (01/16/25 0853)   calcium  gluconate     ceFEPime  (MAXIPIME ) IV Stopped (01/16/25  9361)   metronidazole  Stopped (01/16/25 0424)   sodium phosphate  45 mmol in sodium chloride  0.9 % 250 mL infusion      Assessment/Plan: 1) Anemia and recent melena. 2) Nonobstructive choledocholithiasis. 3) Appendiceal abscess. 4) CMML. 5) Afib.   The patient is stable.  Her HGB is at her baseline and she does not report any further melena and no hematochezia.  She feels better with the IR drain.  Her chart was reviewed in detail.  She does not have an obstructive CBD stone.  Her liver enzymes are normal and she does not have any symptoms consistent with gallstones.  The patient is in overall poor health.  Her Charlson Comorbidity Index is 6, which gives her a 2% chance of living in the next 10 years.  GI procedures can be pursued, if it is a true necessity, but in that instance she will be at even higher risk.  Plan: 1) Monitor HGB. 2) Transfuse as necessary. 3) Dr. Kristie will follow up tomorrow AM.  LOS: 4 days   Bryceton Hantz D 01/16/2025, 10:37 AM

## 2025-01-16 NOTE — Progress Notes (Signed)
"  Patient HR 140-150s, asymptomatic. Informed hospitalist and cardiology team; per cardiologist to give PM metoprolol  early and follow up with cardiology in 90 minutes if HR not lower than 120.  Metoprolol  given. "

## 2025-01-16 NOTE — Progress Notes (Signed)
 "   Referring Physician(s): Dr. Mitzie Freund  Supervising Physician: Luverne Aran  Patient Status:  [facility] - In-pt  Chief Complaint: Intra-abdominal fluid collection  Subjective: Sitting up in bed.  RLQ drain in place.  No complaints related to drain.   Allergies: Digoxin  and related, Gluten meal, Penicillins, Fexofenadine, Alendronate, and Hydroxychloroquine  Medications: Prior to Admission medications  Medication Sig Start Date End Date Taking? Authorizing Provider  acetaminophen  (TYLENOL ) 500 MG tablet Take 2 tablets (1,000 mg total) by mouth 3 (three) times daily. Patient taking differently: Take 1,000 mg by mouth daily at 12 noon. 11/14/23  Yes Samtani, Jai-Gurmukh, MD  apixaban  (ELIQUIS ) 5 MG TABS tablet TAKE 1 TABLET(5 MG) BY MOUTH TWICE DAILY 01/06/25  Yes Tolia, Sunit, DO  azaCITIDine  (VIDAZA ) 100 MG SUSR Inject 114 mg into the vein every 6 (six) weeks. 03/29/23  Yes [provider]  CLARITIN  10 MG tablet Take 10 mg by mouth daily.   Yes [provider]  denosumab  (PROLIA ) 60 MG/ML SOSY injection Inject 60 mg into the skin every 6 (six) months.   Yes [provider]  diltiazem  (CARDIZEM  CD) 360 MG 24 hr capsule Take 1 capsule (360 mg total) by mouth daily. 07/21/24  Yes Swinyer, Rosaline HERO, NP  furosemide  (LASIX ) 20 MG tablet Take 20 mg by mouth in the morning.   Yes [provider]  gabapentin  (NEURONTIN ) 300 MG capsule Take 300 mg by mouth in the morning. 07/21/14  Yes [provider]  inFLIXimab  (REMICADE ) 100 MG injection Inject into the vein every 8 (eight) weeks.   Yes [provider]  Metoprolol  Tartrate 75 MG TABS Take 2 tablets (150 mg total) by mouth 2 (two) times daily. 11/19/24  Yes Tolia, Sunit, DO  ondansetron  (ZOFRAN ) 4 MG tablet Take 1 tablet (4 mg total) by mouth every 8 (eight) hours as needed for nausea or vomiting. 09/23/24  Yes [name], MD  predniSONE  (DELTASONE ) 5 MG tablet Take 5 mg by mouth daily  with breakfast. 09/22/21  Yes [provider]  Tiotropium Bromide-Olodaterol (STIOLTO RESPIMAT ) 2.5-2.5 MCG/ACT AERS Inhale 2 puffs into the lungs daily. 05/21/24  Yes Cobb, Comer GAILS, NP  furosemide  (LASIX ) 20 MG tablet Take 1 tablet (20 mg total) by mouth daily. Patient not taking: Reported on 01/12/2025 05/21/22 01/12/25  Antoinette Doe, MD  nitrofurantoin , macrocrystal-monohydrate, (MACROBID ) 100 MG capsule Take 1 capsule (100 mg total) by mouth 2 (two) times daily. Patient not taking: Reported on 01/12/2025 11/25/24   Hanford Powell BRAVO, NP     Vital Signs: BP 111/61   Pulse (!) 107   Temp 97.9 F (36.6 C) (Oral)   Resp (!) 22   Ht 5' 1 (1.549 m)   Wt 105 lb 2.6 oz (47.7 kg)   SpO2 98%   BMI 19.87 kg/m   Physical Exam NAD, alert Abdomen: RLQ drain in place.  Insertion site intact, suture in place.  Significant amount of blood-tinged beige output on dressing around the drain with only a small amount of serous fluid in the bulb.  Flushes and aspirates with small amount of thick fibrinous clot.   Imaging: CT GUIDED PERITONEAL/RETROPERITONEAL FLUID DRAIN BY PERC CATH Result Date: 01/15/2025 INDICATION: 79 year old female with right lower quadrant fluid collection concerning for abscess, likely perforated appendicitis. EXAM: CT PERC DRAIN PERITONEAL ABCESS COMPARISON:  01/14/2025 MEDICATIONS: The patient is currently admitted to the hospital and receiving intravenous antibiotics. The antibiotics were administered within an appropriate time frame prior to the initiation of  the procedure. ANESTHESIA/SEDATION: Moderate (conscious) sedation was employed during this procedure. A total of Versed  2 mg and Fentanyl  100 mcg was administered intravenously. Moderate Sedation Time: 11 minutes. The patient's level of consciousness and vital signs were monitored continuously by radiology nursing throughout the procedure under my direct supervision. CONTRAST:  None COMPLICATIONS: None immediate.  PROCEDURE: RADIATION DOSE REDUCTION: This exam was performed according to the departmental dose-optimization program which includes automated exposure control, adjustment of the mA and/or kV according to patient size and/or use of iterative reconstruction technique. Informed written consent was obtained from the patient after a discussion of the risks, benefits and alternatives to treatment. The patient was placed supine on the CT gantry and a pre procedural CT was performed re-demonstrating the known abscess/fluid collection within the right lower quadrant. The procedure was planned. A timeout was performed prior to the initiation of the procedure. The right lower quadrant was prepped and draped in the usual sterile fashion. The overlying soft tissues were anesthetized with 1% lidocaine  with epinephrine . Appropriate trajectory was planned with the use of a 22 gauge spinal needle. An 18 gauge trocar needle was advanced into the abscess/fluid collection and a short Amplatz super stiff wire was coiled within the collection. Appropriate positioning was confirmed with a limited CT scan. The tract was serially dilated allowing placement of a 8 French mini loop drainage catheter. Appropriate positioning was confirmed with a limited postprocedural CT scan. Twenty ml of purulent fluid was aspirated. The tube was connected to a bulb suction and sutured in place. A dressing was placed. The patient tolerated the procedure well without immediate post procedural complication. IMPRESSION: Successful CT guided placement of a 8 French mini loop drain catheter into the periappendiceal abscess with aspiration of 20 mL of purulent fluid. Samples were sent to the laboratory as requested by the ordering clinical team. Ester Sides, MD [facility] Electronically Signed   By: Ester Sides M.D.   On: 01/15/2025 13:47   ECHOCARDIOGRAM COMPLETE Result Date: 01/15/2025     ECHOCARDIOGRAM REPORT   Patient Name:   [name] [facility] Date of Exam: 01/15/2025 Medical Rec #:  [id number]        Height:       61.0 in Accession #:    [id number]       Weight:       105.2 lb Date of Birth:  [birth year]       BSA:          1.437 m Patient Age:    78 years         BP:           99/53 mmHg Patient Gender: F                HR:           97 bpm. Exam Location:  Inpatient Procedure: 2D Echo, Cardiac Doppler and Color Doppler (Both Spectral and Color            Flow Doppler were utilized during procedure). Indications:    Atrial Flutter  History:        Patient has prior history of Echocardiogram examinations, most                 recent 03/30/2022. COPD; Arrythmias:Atrial Fibrillation.  Sonographer:    Philomena Daring Referring Phys: [id number] ZANE ADAMS IMPRESSIONS  1. Left ventricular ejection fraction, by estimation, is 50 to 55%. The left ventricle has low normal  function. The left ventricle has no regional wall motion abnormalities. Left ventricular diastolic function could not be evaluated.  2. Right ventricular systolic function is low normal. The right ventricular size is normal. Mildly increased right ventricular wall thickness. There is normal pulmonary artery systolic pressure.  3. Moderate size pericardial effusion (best seen subcostal view, posterior to the LV, respiratory variations across the MV and TV not obtained, IVC is dilated but compressible, not suggestive of tamponade physiology, clinical correlation required). Large pleural effusion in the left lateral region.  4. The mitral valve is degenerative. Mild mitral valve regurgitation. No evidence of mitral stenosis.  5. The aortic valve was not well visualized. Unable to determine aortic valve morphology due to image quality. Aortic valve regurgitation is not visualized. No aortic stenosis is present.  6. The inferior vena cava is dilated in size with >50% respiratory variability, suggesting right atrial pressure of 8 mmHg. Comparison(s): A  prior study was performed on 03/30/2022. LVEF 45-50%, RV function mildly reduced, mild/moderate MR, estimated RAP . Pericardial and pleural effusion are new compared to prior study. Conclusion(s)/Recommendation(s): Consider repeat limited echo in 24-48hrs to evaluate the pericaridal effusion and obtain images to evaluate for tamponade physiology if clinically indicated. FINDINGS  Left Ventricle: Left ventricular ejection fraction, by estimation, is 50 to 55%. The left ventricle has low normal function. The left ventricle has no regional wall motion abnormalities. The left ventricular internal cavity size was small. There is no left ventricular hypertrophy. Left ventricular diastolic function could not be evaluated due to atrial fibrillation. Left ventricular diastolic function could not be evaluated. Right Ventricle: The right ventricular size is normal. Mildly increased right ventricular wall thickness. Right ventricular systolic function is low normal. There is normal pulmonary artery systolic pressure. The tricuspid regurgitant velocity is 2.63 m/s, and with an assumed right atrial pressure of 8 mmHg, the estimated right ventricular systolic pressure is 35.7 mmHg. Left Atrium: Left atrial size was normal in size. Right Atrium: Right atrial size was normal in size. Pericardium: Moderate size pericardial effusion (best seen subcostal view, posterior to the LV, respiratory variations across the MV and TV not obtained, IVC is dilated but compressible, not suggestive of tamponade physiology, clinical correlation required). Mitral Valve: The mitral valve is degenerative in appearance. Mild mitral valve regurgitation. No evidence of mitral valve stenosis. Tricuspid Valve: The tricuspid valve is grossly normal. Tricuspid valve regurgitation is mild . No evidence of tricuspid stenosis. Aortic Valve: The aortic valve was not well visualized. Aortic valve regurgitation is not visualized. No aortic stenosis is present.  Pulmonic Valve: The pulmonic valve was not well visualized. Pulmonic valve regurgitation is not visualized. Aorta: The aortic root and ascending aorta are structurally normal, with no evidence of dilitation. Venous: The inferior vena cava is dilated in size with greater than 50% respiratory variability, suggesting right atrial pressure of 8 mmHg. IAS/Shunts: The atrial septum is grossly normal. Additional Comments: There is a large pleural effusion in the left lateral region.  LEFT VENTRICLE PLAX 2D LVIDd:         3.20 cm   Diastology LVIDs:         2.30 cm   LV e' medial:    8.12 cm/s LV PW:         0.80 cm   LV E/e' medial:  13.6 LV IVS:        0.90 cm   LV e' lateral:   10.47 cm/s LVOT diam:     1.80 cm  LV E/e' lateral: 10.6 LV SV:         39 LV SV Index:   27 LVOT Area:     2.54 cm  RIGHT VENTRICLE             IVC RV S prime:     10.10 cm/s  IVC diam: 2.10 cm TAPSE (M-mode): 1.4 cm LEFT ATRIUM             Index        RIGHT ATRIUM           Index LA diam:        3.40 cm 2.37 cm/m   RA Area:     17.10 cm LA Vol (A2C):   40.2 ml 27.97 ml/m  RA Volume:   40.30 ml  28.04 ml/m LA Vol (A4C):   38.4 ml 26.72 ml/m LA Biplane Vol: 41.8 ml 29.08 ml/m  AORTIC VALVE LVOT Vmax:   104.00 cm/s LVOT Vmean:  67.000 cm/s LVOT VTI:    0.155 m  AORTA Ao Root diam: 2.30 cm Ao Asc diam:  2.30 cm MITRAL VALVE                TRICUSPID VALVE MV Area (PHT): 8.34 cm     TR Peak grad:   27.7 mmHg MV Decel Time: 91 msec      TR Vmax:        263.00 cm/s MV E velocity: 110.67 cm/s                             SHUNTS                             Systemic VTI:  0.16 m                             Systemic Diam: 1.80 cm Sunit Tolia Electronically signed by Madonna Large Signature Date/Time: 01/15/2025/10:48:44 AM    Final    CT ABDOMEN PELVIS W CONTRAST Result Date: 01/14/2025 EXAM: CT ABDOMEN AND PELVIS WITH CONTRAST 01/14/2025 03:25:20 PM TECHNIQUE: CT of the abdomen and pelvis was performed with the administration of 80 mL iohexol   (OMNIPAQUE ) 300 MG/ML solution. Multiplanar reformatted images are provided for review. Automated exposure control, iterative reconstruction, and/or weight-based adjustment of the mA/kV was utilized to reduce the radiation dose to as low as reasonably achievable. COMPARISON: 01/12/2025 CLINICAL HISTORY: RLQ abdominal pain FINDINGS: LOWER CHEST: Similar moderate volume bilateral pleural effusions. Similar appearance of the enhancing ovoid mass in the right lung base in the extrapleural fat measuring 13 x 27 mm. Similar compressive atelectasis also noted in the lung bases. Partially visualized central venous catheter terminating at the cavoatrial junction. LIVER: Unchanged cyst in the left hepatic lobe. Mild dilation of the intrahepatic biliary ducts. GALLBLADDER AND BILE DUCTS: Punctate radiopaque gallstones. No wall thickening. Minimal dilation of the intrahepatic biliary ducts with a 5 mm calculus layering in the distal common bile duct (axial 45). SPLEEN: No acute abnormality. PANCREAS: No acute abnormality. ADRENAL GLANDS: No acute abnormality. KIDNEYS, URETERS AND BLADDER: Similar appearance of multiple bilateral renal cysts. No stones in the kidneys or ureters. No hydronephrosis. No perinephric or periureteral stranding. The urinary bladder is distended without focal abnormality. GI AND BOWEL: Decompressed stomach. Ampullary duodenal diverticulum measuring 1.9 cm. Decompressed appendix with mucosal enhancement. Similar appearance of  the peripherally enhancing fluid collection centered about the distal appendix, measuring 3.2 x 4.5 x 3.9 cm. Extensive descending and sigmoid colonic diverticulosis. No changes of acute diverticulitis. No extravasation of enteric contrast to suggest bowel perforation. There is no bowel obstruction. PERITONEUM AND RETROPERITONEUM: No ascites. No free air. Similar appearance of the large multilobular fat containing masses in the presacral space. VASCULATURE: Aorta is normal in  caliber. Diffuse aortoiliac atherosclerosis. LYMPH NODES: No lymphadenopathy. REPRODUCTIVE ORGANS: Age related atrophy of the uterus and ovaries. BONES AND SOFT TISSUES: Diffuse osteopenia. Moderate dextral curvature of the thoracolumbar spine with superimposed multilevel degenerative disc disease. Mild anasarca. No focal soft tissue abnormality. IMPRESSION: 1. Stable periappendiceal abscess centered about the distal appendix, measuring 3.2 x 4.5 x 3.9 cm. 2. 5 mm linear calcification again noted in the distal CBD, most likely reflecting choledocholithiasis. Minimal intrahepatic biliary ductal dilation present, correlation with serum bilirubin recommended. 3. Similar appearance of the moderate bilateral pleural effusions. Electronically signed by: Rogelia Myers MD 01/14/2025 04:05 PM EST RP Workstation: [id number]    Labs:  CBC: Recent Labs    01/13/25 0615 01/14/25 0304 01/15/25 0830 01/16/25 0815  WBC 72.3* 70.3* 77.7* 87.6*  HGB 9.1* 8.6* 9.1* 9.7*  HCT 29.8* 26.8* 29.9* 32.0*  PLT 140* 149* 150 172    COAGS: Recent Labs    01/12/25 1123 01/15/25 0830  INR 1.7* 1.8*  APTT 33  --     BMP: Recent Labs    01/13/25 0615 01/14/25 0304 01/15/25 0830 01/16/25 0815  NA 134* 136 136 135  K 4.2 3.8 3.8 3.8  CL 105 105 107 106  CO2 21* 24 20* 19*  GLUCOSE 73 100* 96 92  BUN 16 17 12 11   CALCIUM  6.5* 6.8* 6.6* 6.6*  CREATININE 0.95 0.98 0.89 0.99  GFRNONAA >60 59* >60 58*    LIVER FUNCTION TESTS: Recent Labs    01/12/25 1123 01/13/25 0615 01/14/25 0304 01/15/25 0830 01/16/25 0815  BILITOT 0.6 0.4 0.4 0.4  --   AST 26 22 19 19   --   ALT 12 10 10 9   --   ALKPHOS 105 80 86 82  --   PROT 6.3* 4.9* 5.0* 5.2*  --   ALBUMIN 3.5 2.8* 2.9* 2.8* 2.8*    Assessment and Plan: Intra-abdominal fluid collection s/p RLQ drain placement 1/22 by Dr. Jennefer Ribas Location: RLQ Size: Fr size: 8 Fr mini loop Date of placement: 1/22  Currently to: Drain collection device: suction  bulb 24 hour output:  Output by Drain (mL) 01/14/25 0701 - 01/14/25 1900 01/14/25 1901 - 01/15/25 0700 01/15/25 0701 - 01/15/25 1900 01/15/25 1901 - 01/16/25 0700 01/16/25 0701 - 01/16/25 1650  Closed System Drain Inferior;Lateral;Right Back    20 25    Interval imaging/drain manipulation:  None  Current examination: Flushes/aspirates easily with some fibrinous/clotted return. Dressed saturated, will need to be flushed regularly to prevent leaking around the drain.   Plan: Continue TID flushes with 5 cc NS. Record output Q shift. Dressing changes QD or PRN if soiled.  Dressing changed at visit today.   IR will continue to follow - please call with questions or concerns.  Electronically Signed: Guhan Bruington Sue-Ellen Zach Tietje, PA 01/16/2025, 4:47 PM   I spent a total of 15 Minutes at the the patient's bedside AND on the patient's hospital floor or unit, greater than 50% of which was counseling/coordinating care for intra-abdominal fluid collection.       "

## 2025-01-16 NOTE — Progress Notes (Addendum)
 Had discussed RVR, echo, and clinical case with Dr. Floretta this afternoon (one of covering physicians for Dr. Mona this afternoon). Per our discussion, RVR likely reactive to multiple physiologic stressors, OK to continue plan as outlined for metoprolol  and gave afternoon dose early. HR reassessed >90 minutes and remains 110-120 which appears appropriate for degree of illness. Blood pressure remains stable at 111/61 and she remains asymptomatic. Continue plan as outlined. Dr. Floretta reported that if rate remains difficult to control, could consider another bolus of amiodarone , understanding that patient is not on anticoagulation but otherwise options may be limited if blood pressure does not tolerate aggressive titration otherwise.

## 2025-01-16 NOTE — Progress Notes (Signed)
 "   DAILY PROGRESS NOTE   Patient Name: [name] Date of Encounter: 01/16/2025 Cardiologist: Madonna Large, DO  Chief Complaint   No complaints  Patient Profile   [name] is a 79 y.o. 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   Subjective   AFib is again rapid on amiodarone  - HR in the 150's. She is unaware of this.   Objective   Vitals:   01/16/25 0500 01/16/25 0521 01/16/25 0600 01/16/25 0700  BP: 105/76  104/75 123/66  Pulse: (!) 134 92 (!) 148 (!) 141  Resp: 15 15 14 17   Temp:  98 F (36.7 C)    TempSrc:  Oral    SpO2: 96% 100% 95% 95%  Weight:      Height:        Intake/Output Summary (Last 24 hours) at 01/16/2025 0758 Last data filed at 01/16/2025 9376 Gross per 24 hour  Intake 950.26 ml  Output 320 ml  Net 630.26 ml   Filed Weights   01/12/25 2108 01/13/25 1936  Weight: 47.9 kg 47.7 kg    Physical Exam   General appearance: alert and no distress Lungs: clear to auscultation bilaterally Heart: irregularly irregular rhythm Extremities: extremities normal, atraumatic, no cyanosis or edema Neurologic: Mental status: awake, but confused  Inpatient Medications    Scheduled Meds:  arformoterol   15 mcg Nebulization BID   And   umeclidinium bromide   1 puff Inhalation Daily   Chlorhexidine  Gluconate Cloth  6 each Topical Daily   gabapentin   300 mg Oral Daily   pantoprazole  (PROTONIX ) IV  40 mg Intravenous Q24H   predniSONE   5 mg Oral Q breakfast   sodium chloride  flush  10-40 mL Intracatheter Q12H   sodium chloride  flush  5 mL Intracatheter Q8H    Continuous Infusions:  amiodarone  30 mg/hr (01/16/25 9376)   ceFEPime  (MAXIPIME ) IV 200 mL/hr at 01/16/25 9376   metronidazole  Stopped (01/16/25 0424)    PRN  Meds: acetaminophen  **OR** acetaminophen , morphine  injection, ondansetron  **OR** ondansetron  (ZOFRAN ) IV, mouth rinse   Labs   Results for orders placed or performed during the hospital encounter of 01/12/25 (from the past 48 hours)  Magnesium      Status: None   Collection Time: 01/15/25  8:30 AM  Result Value Ref Range   Magnesium  2.2 1.7 - 2.4 mg/dL    Comment: Performed at [facility] [address] [place], [place] [address]  CBC     Status: Abnormal   Collection Time: 01/15/25  8:30 AM  Result Value Ref Range   WBC 77.7 (HH) 4.0 - 10.5 K/uL    Comment: REPEATED TO VERIFY CRITICAL VALUE NOTED.  VALUE IS CONSISTENT WITH PREVIOUSLY REPORTED AND CALLED VALUE. WHITE COUNT CONFIRMED ON SMEAR    RBC 2.73 (L) 3.87 - 5.11 MIL/uL   Hemoglobin 9.1 (L) 12.0 - 15.0 g/dL   HCT 70.0 (L) 63.9 - 53.9 %   MCV 109.5 (H) 80.0 - 100.0 fL   MCH 33.3 26.0 - 34.0 pg   MCHC 30.4 30.0 - 36.0 g/dL   RDW 82.8 (H) 88.4 - 84.4 %   Platelets 150 150 - 400 K/uL   nRBC 0.0 0.0 - 0.2 %    Comment: Performed at [facility], [address]  [place], [place] [address]  Protime-INR     Status: Abnormal   Collection Time: 01/15/25  8:30 AM  Result Value Ref Range   Prothrombin Time 22.2 (H) 11.4 - 15.2 seconds   INR 1.8 (H) 0.8 - 1.2    Comment: (NOTE) INR goal varies based on device and disease states. Performed at [facility], [address] [place], [place] [address]   Comprehensive metabolic panel     Status: Abnormal   Collection Time: 01/15/25  8:30 AM  Result Value Ref Range   Sodium 136 135 - 145 mmol/L   Potassium 3.8 3.5 - 5.1 mmol/L   Chloride 107 98 - 111 mmol/L   CO2 20 (L) 22 - 32 mmol/L   Glucose, Bld 96 70 - 99 mg/dL    Comment: Glucose reference range applies only to samples taken after fasting for at least 8 hours.   BUN 12 8 - 23 mg/dL   Creatinine, Ser 9.10 0.44 - 1.00 mg/dL   Calcium  6.6 (L) 8.9 - 10.3 mg/dL   Total  Protein 5.2 (L) 6.5 - 8.1 g/dL   Albumin  2.8 (L) 3.5 - 5.0 g/dL   AST 19 15 - 41 U/L   ALT 9 0 - 44 U/L   Alkaline Phosphatase 82 38 - 126 U/L   Total Bilirubin 0.4 0.0 - 1.2 mg/dL   GFR, Estimated >39 >39 mL/min    Comment: (NOTE) Calculated using the CKD-EPI Creatinine Equation (2021)    Anion gap 9 5 - 15    Comment: Performed at [facility], [address], [place], [place] [address]  Aerobic/Anaerobic Culture w Gram Stain (surgical/deep wound)     Status: None (Preliminary result)   Collection Time: 01/15/25 12:59 PM   Specimen: Abdomen; Abscess  Result Value Ref Range   Specimen Description      ABDOMEN Performed at [facility], [address], [place], [place] [address]    Special Requests      NONE Performed at [facility], [address], [place], [place] [address]    Gram Stain      ABUNDANT WBC PRESENT, PREDOMINANTLY PMN RARE GRAM NEGATIVE RODS Performed at [facility], [address] [place], [place] [address]    Culture PENDING    Report Status PENDING     ECG   N/A  Telemetry   Afib with RVR - Personally Reviewed  Radiology    CT GUIDED PERITONEAL/RETROPERITONEAL FLUID DRAIN BY PERC CATH Result Date: 01/15/2025 INDICATION: 79 year old female with right lower quadrant fluid collection concerning for abscess, likely perforated appendicitis. EXAM: CT PERC DRAIN PERITONEAL ABCESS COMPARISON:  01/14/2025 MEDICATIONS: The patient is currently admitted to the hospital and receiving intravenous antibiotics. The antibiotics were administered within an appropriate time frame prior to the initiation of the procedure. ANESTHESIA/SEDATION: Moderate (conscious) sedation was employed during this procedure. A total of Versed  2 mg and Fentanyl  100 mcg was administered intravenously. Moderate Sedation Time: 11 minutes. The patient's level of consciousness and vital signs were monitored continuously by  radiology nursing throughout the procedure under my direct supervision. CONTRAST:  None COMPLICATIONS: None immediate. PROCEDURE: RADIATION DOSE REDUCTION: This exam was performed according to the departmental dose-optimization program which includes automated exposure control, adjustment of the mA and/or kV according to patient size and/or use of iterative reconstruction technique. Informed written consent was obtained from the patient after a discussion of the risks, benefits and alternatives to treatment. The patient  was placed supine on the CT gantry and a pre procedural CT was performed re-demonstrating the known abscess/fluid collection within the right lower quadrant. The procedure was planned. A timeout was performed prior to the initiation of the procedure. The right lower quadrant was prepped and draped in the usual sterile fashion. The overlying soft tissues were anesthetized with 1% lidocaine  with epinephrine . Appropriate trajectory was planned with the use of a 22 gauge spinal needle. An 18 gauge trocar needle was advanced into the abscess/fluid collection and a short Amplatz super stiff wire was coiled within the collection. Appropriate positioning was confirmed with a limited CT scan. The tract was serially dilated allowing placement of a 8 French mini loop drainage catheter. Appropriate positioning was confirmed with a limited postprocedural CT scan. Twenty ml of purulent fluid was aspirated. The tube was connected to a bulb suction and sutured in place. A dressing was placed. The patient tolerated the procedure well without immediate post procedural complication. IMPRESSION: Successful CT guided placement of a 8 French mini loop drain catheter into the periappendiceal abscess with aspiration of 20 mL of purulent fluid. Samples were sent to the laboratory as requested by the ordering clinical team. Ester Sides, MD [facility] Electronically  Signed   By: Ester Sides M.D.   On: 01/15/2025 13:47   ECHOCARDIOGRAM COMPLETE Result Date: 01/15/2025    ECHOCARDIOGRAM REPORT   Patient Name:   RAINIE CRENSHAW [facility] Date of Exam: 01/15/2025 Medical Rec #:  [id number]        Height:       61.0 in Accession #:    [id number]       Weight:       105.2 lb Date of Birth:  1946/11/16       BSA:          1.437 m Patient Age:    78 years         BP:           99/53 mmHg Patient Gender: F                HR:           97 bpm. Exam Location:  Inpatient Procedure: 2D Echo, Cardiac Doppler and Color Doppler (Both Spectral and Color            Flow Doppler were utilized during procedure). Indications:    Atrial Flutter  History:        Patient has prior history of Echocardiogram examinations, most                 recent 03/30/2022. COPD; Arrythmias:Atrial Fibrillation.  Sonographer:    Philomena Daring Referring Phys: [id number] ZANE ADAMS IMPRESSIONS  1. Left ventricular ejection fraction, by estimation, is 50 to 55%. The left ventricle has low normal function. The left ventricle has no regional wall motion abnormalities. Left ventricular diastolic function could not be evaluated.  2. Right ventricular systolic function is low normal. The right ventricular size is normal. Mildly increased right ventricular wall thickness. There is normal pulmonary artery systolic pressure.  3. Moderate size pericardial effusion (best seen subcostal view, posterior to the LV, respiratory variations across the MV and TV not obtained, IVC is dilated but compressible, not suggestive of tamponade physiology, clinical correlation required). Large pleural effusion in the left lateral region.  4. The mitral valve is degenerative. Mild mitral valve regurgitation. No evidence of mitral stenosis.  5. The aortic valve was not  well visualized. Unable to determine aortic valve morphology due to image quality. Aortic valve regurgitation is not visualized. No aortic stenosis is present.  6. The inferior vena cava is  dilated in size with >50% respiratory variability, suggesting right atrial pressure of 8 mmHg. Comparison(s): A prior study was performed on 03/30/2022. LVEF 45-50%, RV function mildly reduced, mild/moderate MR, estimated RAP . Pericardial and pleural effusion are new compared to prior study. Conclusion(s)/Recommendation(s): Consider repeat limited echo in 24-48hrs to evaluate the pericaridal effusion and obtain images to evaluate for tamponade physiology if clinically indicated. FINDINGS  Left Ventricle: Left ventricular ejection fraction, by estimation, is 50 to 55%. The left ventricle has low normal function. The left ventricle has no regional wall motion abnormalities. The left ventricular internal cavity size was small. There is no left ventricular hypertrophy. Left ventricular diastolic function could not be evaluated due to atrial fibrillation. Left ventricular diastolic function could not be evaluated. Right Ventricle: The right ventricular size is normal. Mildly increased right ventricular wall thickness. Right ventricular systolic function is low normal. There is normal pulmonary artery systolic pressure. The tricuspid regurgitant velocity is 2.63 m/s, and with an assumed right atrial pressure of 8 mmHg, the estimated right ventricular systolic pressure is 35.7 mmHg. Left Atrium: Left atrial size was normal in size. Right Atrium: Right atrial size was normal in size. Pericardium: Moderate size pericardial effusion (best seen subcostal view, posterior to the LV, respiratory variations across the MV and TV not obtained, IVC is dilated but compressible, not suggestive of tamponade physiology, clinical correlation required). Mitral Valve: The mitral valve is degenerative in appearance. Mild mitral valve regurgitation. No evidence of mitral valve stenosis. Tricuspid Valve: The tricuspid valve is grossly normal. Tricuspid valve regurgitation is mild . No evidence of tricuspid stenosis. Aortic Valve: The  aortic valve was not well visualized. Aortic valve regurgitation is not visualized. No aortic stenosis is present. Pulmonic Valve: The pulmonic valve was not well visualized. Pulmonic valve regurgitation is not visualized. Aorta: The aortic root and ascending aorta are structurally normal, with no evidence of dilitation. Venous: The inferior vena cava is dilated in size with greater than 50% respiratory variability, suggesting right atrial pressure of 8 mmHg. IAS/Shunts: The atrial septum is grossly normal. Additional Comments: There is a large pleural effusion in the left lateral region.  LEFT VENTRICLE PLAX 2D LVIDd:         3.20 cm   Diastology LVIDs:         2.30 cm   LV e' medial:    8.12 cm/s LV PW:         0.80 cm   LV E/e' medial:  13.6 LV IVS:        0.90 cm   LV e' lateral:   10.47 cm/s LVOT diam:     1.80 cm   LV E/e' lateral: 10.6 LV SV:         39 LV SV Index:   27 LVOT Area:     2.54 cm  RIGHT VENTRICLE             IVC RV S prime:     10.10 cm/s  IVC diam: 2.10 cm TAPSE (M-mode): 1.4 cm LEFT ATRIUM             Index        RIGHT ATRIUM           Index LA diam:        3.40 cm 2.37 cm/m  RA Area:     17.10 cm LA Vol (A2C):   40.2 ml 27.97 ml/m  RA Volume:   40.30 ml  28.04 ml/m LA Vol (A4C):   38.4 ml 26.72 ml/m LA Biplane Vol: 41.8 ml 29.08 ml/m  AORTIC VALVE LVOT Vmax:   104.00 cm/s LVOT Vmean:  67.000 cm/s LVOT VTI:    0.155 m  AORTA Ao Root diam: 2.30 cm Ao Asc diam:  2.30 cm MITRAL VALVE                TRICUSPID VALVE MV Area (PHT): 8.34 cm     TR Peak grad:   27.7 mmHg MV Decel Time: 91 msec      TR Vmax:        263.00 cm/s MV E velocity: 110.67 cm/s                             SHUNTS                             Systemic VTI:  0.16 m                             Systemic Diam: 1.80 cm Sunit Tolia Electronically signed by Madonna Large Signature Date/Time: 01/15/2025/10:48:44 AM    Final    CT ABDOMEN PELVIS W CONTRAST Result Date: 01/14/2025 EXAM: CT ABDOMEN AND PELVIS WITH CONTRAST  01/14/2025 03:25:20 PM TECHNIQUE: CT of the abdomen and pelvis was performed with the administration of 80 mL iohexol  (OMNIPAQUE ) 300 MG/ML solution. Multiplanar reformatted images are provided for review. Automated exposure control, iterative reconstruction, and/or weight-based adjustment of the mA/kV was utilized to reduce the radiation dose to as low as reasonably achievable. COMPARISON: 01/12/2025 CLINICAL HISTORY: RLQ abdominal pain FINDINGS: LOWER CHEST: Similar moderate volume bilateral pleural effusions. Similar appearance of the enhancing ovoid mass in the right lung base in the extrapleural fat measuring 13 x 27 mm. Similar compressive atelectasis also noted in the lung bases. Partially visualized central venous catheter terminating at the cavoatrial junction. LIVER: Unchanged cyst in the left hepatic lobe. Mild dilation of the intrahepatic biliary ducts. GALLBLADDER AND BILE DUCTS: Punctate radiopaque gallstones. No wall thickening. Minimal dilation of the intrahepatic biliary ducts with a 5 mm calculus layering in the distal common bile duct (axial 45). SPLEEN: No acute abnormality. PANCREAS: No acute abnormality. ADRENAL GLANDS: No acute abnormality. KIDNEYS, URETERS AND BLADDER: Similar appearance of multiple bilateral renal cysts. No stones in the kidneys or ureters. No hydronephrosis. No perinephric or periureteral stranding. The urinary bladder is distended without focal abnormality. GI AND BOWEL: Decompressed stomach. Ampullary duodenal diverticulum measuring 1.9 cm. Decompressed appendix with mucosal enhancement. Similar appearance of the peripherally enhancing fluid collection centered about the distal appendix, measuring 3.2 x 4.5 x 3.9 cm. Extensive descending and sigmoid colonic diverticulosis. No changes of acute diverticulitis. No extravasation of enteric contrast to suggest bowel perforation. There is no bowel obstruction. PERITONEUM AND RETROPERITONEUM: No ascites. No free air. Similar  appearance of the large multilobular fat containing masses in the presacral space. VASCULATURE: Aorta is normal in caliber. Diffuse aortoiliac atherosclerosis. LYMPH NODES: No lymphadenopathy. REPRODUCTIVE ORGANS: Age related atrophy of the uterus and ovaries. BONES AND SOFT TISSUES: Diffuse osteopenia. Moderate dextral curvature of the thoracolumbar spine with superimposed multilevel degenerative disc disease. Mild anasarca. No focal soft tissue abnormality.  IMPRESSION: 1. Stable periappendiceal abscess centered about the distal appendix, measuring 3.2 x 4.5 x 3.9 cm. 2. 5 mm linear calcification again noted in the distal CBD, most likely reflecting choledocholithiasis. Minimal intrahepatic biliary ductal dilation present, correlation with serum bilirubin recommended. 3. Similar appearance of the moderate bilateral pleural effusions. Electronically signed by: Rogelia Myers MD 01/14/2025 04:05 PM EST RP Workstation: [id number]    Cardiac Studies   See echo above  Assessment   Principal Problem:   Appendicitis Active Problems:   Atrial fibrillation with rapid ventricular response (HCC)   Acute appendicitis with appendiceal abscess   Plan   Afib continues to be rapid.  Recommend increasing infusion rate to 60 mg/hr. Will add back metoprolol  25 mg q8hr.  Time Spent Directly with Patient:  I have spent a total of 25 minutes with the patient reviewing hospital notes, telemetry, EKGs, labs and examining the patient as well as establishing an assessment and plan that was discussed personally with the patient.  > 50% of time was spent in direct patient care.  Length of Stay:  LOS: 4 days   Vinie KYM Maxcy, MD, [facility], FNLA, FACP  [place]  [facility]  Medical Director of the [facility] Lipid Disorders &  Cardiovascular Risk Reduction Clinic Diplomate of the American Board of Clinical Lipidology Attending Cardiologist  Direct Dial: [phone]  Fax: [phone]  Website:   [email or web address] Vinie JAYSON Maxcy 01/16/2025, 7:58 AM   "

## 2025-01-16 NOTE — IPAL (Signed)
" °  Interdisciplinary Goals of Care Family Meeting   Date carried out: 01/16/2025  Location of the meeting: Bedside  Member's involved: Physician, Bedside Registered Nurse, and Family Member or next of kin (husband)  Durable Power of Insurance risk surveyor: Patient  Discussion: We discussed goals of care for [name] . Extensive goals of care discussion with focus on CODE STATUS.  Patient is awake and oriented x 4 with good insight.  We have discussed pros and cons of CPR in light of his comorbidity and current condition.  She has significant cardiopulmonary disease and deemed to be high risk even for simple surgery such as appendectomy.  We have discussed that chance of successful CPR is very low.  It also comes with high risk of suffering such as rib fracture, pain and significant brain damage afterward.  Quality of life will not be the same.  Patient's voiced understanding but likes to remain full code for now.  I also offered palliative care consult for further discussion and clarification but patient kindly declined this at this time.  She says she will let us  know if she changes her mind.      Code status:   Code Status: Full Code   Disposition: Continue current acute care  Time spent for the meeting: 30 minutes    Sherry ONEIDA Bump, MD  01/16/2025, 11:24 AM   "

## 2025-01-16 NOTE — Progress Notes (Signed)
 "  Progress Note     Subjective:  No specific complaints this morning.  ROS  All negative with the exception of above.  Objective: Vital signs in last 24 hours: Temp:  [97.9 F (36.6 C)-98.3 F (36.8 C)] 98 F (36.7 C) (01/23 0521) Pulse Rate:  [92-155] 155 (01/23 0811) Resp:  [13-30] 17 (01/23 0700) BP: (87-138)/(48-80) 117/67 (01/23 0811) SpO2:  [93 %-100 %] 94 % (01/23 0911) Last BM Date : 01/14/25  Intake/Output from previous day: 01/22 0701 - 01/23 0700 In: 950.3 [I.V.:583.9; IV Piggyback:366.4] Out: 320 [Urine:300; Drains:20] Intake/Output this shift: Total I/O In: 98 [I.V.:46.6; IV Piggyback:51.4] Out: -   PE: General: Pleasant chronically ill appearing female who is laying in bed in NAD. Chest:  tachy/ af rvr; blood pressures appear a little bit soft, respiratory effort nonlabored  Abd: Soft, nondistended.  Minimal tenderness to palpation of RLQ.  No peritoneal signs.  Drain output is blood-tinged, slightly murky but more serosanguineous Skin: Warm and dry.  Psych: A&Ox3 with an appropriate affect.    Lab Results:  Recent Labs    01/15/25 0830 01/16/25 0815  WBC 77.7* 87.6*  HGB 9.1* 9.7*  HCT 29.9* 32.0*  PLT 150 172   BMET Recent Labs    01/14/25 0304 01/15/25 0830  NA 136 136  K 3.8 3.8  CL 105 107  CO2 24 20*  GLUCOSE 100* 96  BUN 17 12  CREATININE 0.98 0.89  CALCIUM  6.8* 6.6*   PT/INR Recent Labs    01/15/25 0830  LABPROT 22.2*  INR 1.8*   CMP     Component Value Date/Time   NA 136 01/15/2025 0830   NA 141 01/04/2016 1001   K 3.8 01/15/2025 0830   K 4.5 01/04/2016 1001   CL 107 01/15/2025 0830   CO2 20 (L) 01/15/2025 0830   CO2 24 01/04/2016 1001   GLUCOSE 96 01/15/2025 0830   GLUCOSE 105 01/04/2016 1001   BUN 12 01/15/2025 0830   BUN 21.7 01/04/2016 1001   CREATININE 0.89 01/15/2025 0830   CREATININE 1.48 (H) 12/15/2024 1057   CREATININE 1.2 (H) 01/04/2016 1001   CALCIUM  6.6 (L) 01/15/2025 0830   CALCIUM  9.7  01/04/2016 1001   PROT 5.2 (L) 01/15/2025 0830   PROT 7.0 01/04/2016 1001   ALBUMIN 2.8 (L) 01/15/2025 0830   ALBUMIN 4.0 01/04/2016 1001   AST 19 01/15/2025 0830   AST 21 12/21/2023 1359   AST 26 01/04/2016 1001   ALT 9 01/15/2025 0830   ALT 14 12/21/2023 1359   ALT 24 01/04/2016 1001   ALKPHOS 82 01/15/2025 0830   ALKPHOS 61 01/04/2016 1001   BILITOT 0.4 01/15/2025 0830   BILITOT 0.8 12/21/2023 1359   BILITOT 0.41 01/04/2016 1001   GFRNONAA >60 01/15/2025 0830   GFRNONAA 36 (L) 12/15/2024 1057   GFRAA 56 (L) 01/09/2020 1339   Lipase     Component Value Date/Time   LIPASE 26 01/12/2025 1123       Studies/Results: CT GUIDED PERITONEAL/RETROPERITONEAL FLUID DRAIN BY PERC CATH Result Date: 01/15/2025 INDICATION: 79 year old female with right lower quadrant fluid collection concerning for abscess, likely perforated appendicitis. EXAM: CT PERC DRAIN PERITONEAL ABCESS COMPARISON:  01/14/2025 MEDICATIONS: The patient is currently admitted to the hospital and receiving intravenous antibiotics. The antibiotics were administered within an appropriate time frame prior to the initiation of the procedure. ANESTHESIA/SEDATION: Moderate (conscious) sedation was employed during this procedure. A total of Versed  2 mg and Fentanyl  100 mcg was administered  intravenously. Moderate Sedation Time: 11 minutes. The patient's level of consciousness and vital signs were monitored continuously by radiology nursing throughout the procedure under my direct supervision. CONTRAST:  None COMPLICATIONS: None immediate. PROCEDURE: RADIATION DOSE REDUCTION: This exam was performed according to the departmental dose-optimization program which includes automated exposure control, adjustment of the mA and/or kV according to patient size and/or use of iterative reconstruction technique. Informed written consent was obtained from the patient after a discussion of the risks, benefits and alternatives to treatment. The  patient was placed supine on the CT gantry and a pre procedural CT was performed re-demonstrating the known abscess/fluid collection within the right lower quadrant. The procedure was planned. A timeout was performed prior to the initiation of the procedure. The right lower quadrant was prepped and draped in the usual sterile fashion. The overlying soft tissues were anesthetized with 1% lidocaine  with epinephrine . Appropriate trajectory was planned with the use of a 22 gauge spinal needle. An 18 gauge trocar needle was advanced into the abscess/fluid collection and a short Amplatz super stiff wire was coiled within the collection. Appropriate positioning was confirmed with a limited CT scan. The tract was serially dilated allowing placement of a 8 French mini loop drainage catheter. Appropriate positioning was confirmed with a limited postprocedural CT scan. Twenty ml of purulent fluid was aspirated. The tube was connected to a bulb suction and sutured in place. A dressing was placed. The patient tolerated the procedure well without immediate post procedural complication. IMPRESSION: Successful CT guided placement of a 8 French mini loop drain catheter into the periappendiceal abscess with aspiration of 20 mL of purulent fluid. Samples were sent to the laboratory as requested by the ordering clinical team. Sherry Sides, MD [facility] Radiology Electronically Signed   By: [name] M.D.   On: 01/15/2025 13:47   ECHOCARDIOGRAM COMPLETE Result Date: 01/15/2025    ECHOCARDIOGRAM REPORT   Patient Name:   [name] [facility] Date of Exam: 01/15/2025 Medical Rec #:  [id number]        Height:       61.0 in Accession #:    [id number]       Weight:       105.2 lb Date of Birth:  1946-01-15       BSA:          1.437 m Patient Age:    78 years         BP:           99/53 mmHg Patient Gender: F                HR:           97 bpm. Exam Location:  Inpatient Procedure: 2D Echo,  Cardiac Doppler and Color Doppler (Both Spectral and Color            Flow Doppler were utilized during procedure). Indications:    Atrial Flutter  History:        Patient has prior history of Echocardiogram examinations, most                 recent 03/30/2022. COPD; Arrythmias:Atrial Fibrillation.  Sonographer:    Philomena Daring Referring Phys: [id number] ZANE ADAMS IMPRESSIONS  1. Left ventricular ejection fraction, by estimation, is 50 to 55%. The left ventricle has low normal function. The left ventricle has no regional wall motion abnormalities. Left ventricular diastolic function could not be evaluated.  2. Right ventricular systolic  function is low normal. The right ventricular size is normal. Mildly increased right ventricular wall thickness. There is normal pulmonary artery systolic pressure.  3. Moderate size pericardial effusion (best seen subcostal view, posterior to the LV, respiratory variations across the MV and TV not obtained, IVC is dilated but compressible, not suggestive of tamponade physiology, clinical correlation required). Large pleural effusion in the left lateral region.  4. The mitral valve is degenerative. Mild mitral valve regurgitation. No evidence of mitral stenosis.  5. The aortic valve was not well visualized. Unable to determine aortic valve morphology due to image quality. Aortic valve regurgitation is not visualized. No aortic stenosis is present.  6. The inferior vena cava is dilated in size with >50% respiratory variability, suggesting right atrial pressure of 8 mmHg. Comparison(s): A prior study was performed on 03/30/2022. LVEF 45-50%, RV function mildly reduced, mild/moderate MR, estimated RAP . Pericardial and pleural effusion are new compared to prior study. Conclusion(s)/Recommendation(s): Consider repeat limited echo in 24-48hrs to evaluate the pericaridal effusion and obtain images to evaluate for tamponade physiology if clinically indicated. FINDINGS  Left Ventricle:  Left ventricular ejection fraction, by estimation, is 50 to 55%. The left ventricle has low normal function. The left ventricle has no regional wall motion abnormalities. The left ventricular internal cavity size was small. There is no left ventricular hypertrophy. Left ventricular diastolic function could not be evaluated due to atrial fibrillation. Left ventricular diastolic function could not be evaluated. Right Ventricle: The right ventricular size is normal. Mildly increased right ventricular wall thickness. Right ventricular systolic function is low normal. There is normal pulmonary artery systolic pressure. The tricuspid regurgitant velocity is 2.63 m/s, and with an assumed right atrial pressure of 8 mmHg, the estimated right ventricular systolic pressure is 35.7 mmHg. Left Atrium: Left atrial size was normal in size. Right Atrium: Right atrial size was normal in size. Pericardium: Moderate size pericardial effusion (best seen subcostal view, posterior to the LV, respiratory variations across the MV and TV not obtained, IVC is dilated but compressible, not suggestive of tamponade physiology, clinical correlation required). Mitral Valve: The mitral valve is degenerative in appearance. Mild mitral valve regurgitation. No evidence of mitral valve stenosis. Tricuspid Valve: The tricuspid valve is grossly normal. Tricuspid valve regurgitation is mild . No evidence of tricuspid stenosis. Aortic Valve: The aortic valve was not well visualized. Aortic valve regurgitation is not visualized. No aortic stenosis is present. Pulmonic Valve: The pulmonic valve was not well visualized. Pulmonic valve regurgitation is not visualized. Aorta: The aortic root and ascending aorta are structurally normal, with no evidence of dilitation. Venous: The inferior vena cava is dilated in size with greater than 50% respiratory variability, suggesting right atrial pressure of 8 mmHg. IAS/Shunts: The atrial septum is grossly normal.  Additional Comments: There is a large pleural effusion in the left lateral region.  LEFT VENTRICLE PLAX 2D LVIDd:         3.20 cm   Diastology LVIDs:         2.30 cm   LV e' medial:    8.12 cm/s LV PW:         0.80 cm   LV E/e' medial:  13.6 LV IVS:        0.90 cm   LV e' lateral:   10.47 cm/s LVOT diam:     1.80 cm   LV E/e' lateral: 10.6 LV SV:         39 LV SV Index:   27 LVOT  Area:     2.54 cm  RIGHT VENTRICLE             IVC RV S prime:     10.10 cm/s  IVC diam: 2.10 cm TAPSE (M-mode): 1.4 cm LEFT ATRIUM             Index        RIGHT ATRIUM           Index LA diam:        3.40 cm 2.37 cm/m   RA Area:     17.10 cm LA Vol (A2C):   40.2 ml 27.97 ml/m  RA Volume:   40.30 ml  28.04 ml/m LA Vol (A4C):   38.4 ml 26.72 ml/m LA Biplane Vol: 41.8 ml 29.08 ml/m  AORTIC VALVE LVOT Vmax:   104.00 cm/s LVOT Vmean:  67.000 cm/s LVOT VTI:    0.155 m  AORTA Ao Root diam: 2.30 cm Ao Asc diam:  2.30 cm MITRAL VALVE                TRICUSPID VALVE MV Area (PHT): 8.34 cm     TR Peak grad:   27.7 mmHg MV Decel Time: 91 msec      TR Vmax:        263.00 cm/s MV E velocity: 110.67 cm/s                             SHUNTS                             Systemic VTI:  0.16 m                             Systemic Diam: 1.80 cm Sunit Tolia Electronically signed by Madonna Large Signature Date/Time: 01/15/2025/10:48:44 AM    Final    CT ABDOMEN PELVIS W CONTRAST Result Date: 01/14/2025 EXAM: CT ABDOMEN AND PELVIS WITH CONTRAST 01/14/2025 03:25:20 PM TECHNIQUE: CT of the abdomen and pelvis was performed with the administration of 80 mL iohexol  (OMNIPAQUE ) 300 MG/ML solution. Multiplanar reformatted images are provided for review. Automated exposure control, iterative reconstruction, and/or weight-based adjustment of the mA/kV was utilized to reduce the radiation dose to as low as reasonably achievable. COMPARISON: 01/12/2025 CLINICAL HISTORY: RLQ abdominal pain FINDINGS: LOWER CHEST: Similar moderate volume bilateral pleural effusions.  Similar appearance of the enhancing ovoid mass in the right lung base in the extrapleural fat measuring 13 x 27 mm. Similar compressive atelectasis also noted in the lung bases. Partially visualized central venous catheter terminating at the cavoatrial junction. LIVER: Unchanged cyst in the left hepatic lobe. Mild dilation of the intrahepatic biliary ducts. GALLBLADDER AND BILE DUCTS: Punctate radiopaque gallstones. No wall thickening. Minimal dilation of the intrahepatic biliary ducts with a 5 mm calculus layering in the distal common bile duct (axial 45). SPLEEN: No acute abnormality. PANCREAS: No acute abnormality. ADRENAL GLANDS: No acute abnormality. KIDNEYS, URETERS AND BLADDER: Similar appearance of multiple bilateral renal cysts. No stones in the kidneys or ureters. No hydronephrosis. No perinephric or periureteral stranding. The urinary bladder is distended without focal abnormality. GI AND BOWEL: Decompressed stomach. Ampullary duodenal diverticulum measuring 1.9 cm. Decompressed appendix with mucosal enhancement. Similar appearance of the peripherally enhancing fluid collection centered about the distal appendix, measuring 3.2 x 4.5 x 3.9 cm. Extensive descending and sigmoid colonic  diverticulosis. No changes of acute diverticulitis. No extravasation of enteric contrast to suggest bowel perforation. There is no bowel obstruction. PERITONEUM AND RETROPERITONEUM: No ascites. No free air. Similar appearance of the large multilobular fat containing masses in the presacral space. VASCULATURE: Aorta is normal in caliber. Diffuse aortoiliac atherosclerosis. LYMPH NODES: No lymphadenopathy. REPRODUCTIVE ORGANS: Age related atrophy of the uterus and ovaries. BONES AND SOFT TISSUES: Diffuse osteopenia. Moderate dextral curvature of the thoracolumbar spine with superimposed multilevel degenerative disc disease. Mild anasarca. No focal soft tissue abnormality. IMPRESSION: 1. Stable periappendiceal abscess centered  about the distal appendix, measuring 3.2 x 4.5 x 3.9 cm. 2. 5 mm linear calcification again noted in the distal CBD, most likely reflecting choledocholithiasis. Minimal intrahepatic biliary ductal dilation present, correlation with serum bilirubin recommended. 3. Similar appearance of the moderate bilateral pleural effusions. Electronically signed by: Rogelia Myers MD 01/14/2025 04:05 PM EST RP Workstation: [id number]    Anti-infectives: Anti-infectives (From admission, onward)    Start     Dose/Rate Route Frequency Ordered Stop   01/14/25 0600  ceFEPIme  (MAXIPIME ) 2 g in sodium chloride  0.9 % 100 mL IVPB        2 g 200 mL/hr over 30 Minutes Intravenous Every 12 hours 01/13/25 1629     01/12/25 1830  metroNIDAZOLE  (FLAGYL ) IVPB 500 mg        500 mg 100 mL/hr over 60 Minutes Intravenous Every 12 hours 01/12/25 1822     01/12/25 1830  ceFEPIme  (MAXIPIME ) 2 g in sodium chloride  0.9 % 100 mL IVPB  Status:  Discontinued        2 g 200 mL/hr over 30 Minutes Intravenous Every 8 hours 01/12/25 1825 01/13/25 1629   01/12/25 1445  ceFEPIme  (MAXIPIME ) 2 g in sodium chloride  0.9 % 100 mL IVPB       Placed in And Linked Group   2 g 200 mL/hr over 30 Minutes Intravenous  Once 01/12/25 1436 01/12/25 1533   01/12/25 1445  metroNIDAZOLE  (FLAGYL ) IVPB 500 mg       Placed in And Linked Group   500 mg 100 mL/hr over 60 Minutes Intravenous  Once 01/12/25 1436 01/12/25 1606        Assessment/Plan Periappendiceal abscess -Reviewed with IR 1/19, plan at that time for repeat CT in a few days- CT 1/22 with minimal change and subsequently IR drain placed successfully -Afebrile, WBC remains markedly elevated, now up to 87.6 -Continue with IV abx  -Given her frailty and multiple medical problems including recent chemo, daily steroid use, Eliquis , active A-fib with RVR and hypotension etc, we pan to continue to treat with conservative management at this time and avoid surgical intervention if able. She is  extremely high risk.   FEN -D3 diet; IVFs per primary team VTE - Hold Eliquis , hold addition of heparin  gtt due to melena ID - Cefipime/Flagyl    UGI Bleed/Melena/anemia - unclear etiology.  No history of ulcer disease or NSAID use.  Her hgb appears stable at this time and she is HDS stable considering her Afib with RVR.  Will need GI eval  Choledocholithiasis - seems to be asymptomatic.  LFTs are normal indicating likely this to be nonocclusive.  Monitor LFTs.  At some point, will likely need this addressed, but as of now this is not the most acute problem. 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 WBC baseline prior to admission ranging from 25-47 CHF/ cardiomyopathy A fib, RVR -  cardiology consulted, echo pending. 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  This care required high level of medical decision making.   Sherry DELENA Freund, MD  [facility] Surgery 01/16/2025, 9:18 AM Please see Amion for pager number during day hours 7:00am-4:30pm  "

## 2025-01-16 NOTE — Progress Notes (Signed)
 PT Cancellation Note  Patient Details Name: [name] MRN: [id number] DOB: 1946-05-30   Cancelled Treatment:    Reason Eval/Treat Not Completed: Medical issues which prohibited therapy, afib, on amio.PT will follow for medical stability to evaluate. [name] PT Acute Rehabilitation Services Office [phone]    [name] 01/16/2025, 9:19 AM

## 2025-01-17 ENCOUNTER — Inpatient Hospital Stay (HOSPITAL_COMMUNITY)

## 2025-01-17 DIAGNOSIS — K3580 Unspecified acute appendicitis: Secondary | ICD-10-CM | POA: Diagnosis not present

## 2025-01-17 DIAGNOSIS — I3139 Other pericardial effusion (noninflammatory): Secondary | ICD-10-CM | POA: Diagnosis not present

## 2025-01-17 DIAGNOSIS — E440 Moderate protein-calorie malnutrition: Secondary | ICD-10-CM | POA: Diagnosis not present

## 2025-01-17 DIAGNOSIS — I4891 Unspecified atrial fibrillation: Secondary | ICD-10-CM | POA: Diagnosis not present

## 2025-01-17 DIAGNOSIS — K3533 Acute appendicitis with perforation and localized peritonitis, with abscess: Secondary | ICD-10-CM | POA: Diagnosis not present

## 2025-01-17 DIAGNOSIS — Z7189 Other specified counseling: Secondary | ICD-10-CM | POA: Diagnosis not present

## 2025-01-17 LAB — RENAL FUNCTION PANEL
Albumin: 3.1 g/dL — ABNORMAL LOW (ref 3.5–5.0)
Anion gap: 12 (ref 5–15)
BUN: 11 mg/dL (ref 8–23)
CO2: 19 mmol/L — ABNORMAL LOW (ref 22–32)
Calcium: 7 mg/dL — ABNORMAL LOW (ref 8.9–10.3)
Chloride: 107 mmol/L (ref 98–111)
Creatinine, Ser: 1.02 mg/dL — ABNORMAL HIGH (ref 0.44–1.00)
GFR, Estimated: 56 mL/min — ABNORMAL LOW
Glucose, Bld: 107 mg/dL — ABNORMAL HIGH (ref 70–99)
Phosphorus: 3.2 mg/dL (ref 2.5–4.6)
Potassium: 3.3 mmol/L — ABNORMAL LOW (ref 3.5–5.1)
Sodium: 138 mmol/L (ref 135–145)

## 2025-01-17 LAB — CBC
HCT: 26.8 % — ABNORMAL LOW (ref 36.0–46.0)
Hemoglobin: 8.6 g/dL — ABNORMAL LOW (ref 12.0–15.0)
MCH: 33.6 pg (ref 26.0–34.0)
MCHC: 32.1 g/dL (ref 30.0–36.0)
MCV: 104.7 fL — ABNORMAL HIGH (ref 80.0–100.0)
Platelets: 174 10*3/uL (ref 150–400)
RBC: 2.56 MIL/uL — ABNORMAL LOW (ref 3.87–5.11)
RDW: 17.2 % — ABNORMAL HIGH (ref 11.5–15.5)
WBC: 80.1 10*3/uL (ref 4.0–10.5)
nRBC: 0 % (ref 0.0–0.2)

## 2025-01-17 LAB — PRO BRAIN NATRIURETIC PEPTIDE: Pro Brain Natriuretic Peptide: 8319 pg/mL — ABNORMAL HIGH

## 2025-01-17 LAB — MAGNESIUM: Magnesium: 2.1 mg/dL (ref 1.7–2.4)

## 2025-01-17 MED ORDER — MORPHINE SULFATE (PF) 2 MG/ML IV SOLN
2.0000 mg | INTRAVENOUS | Status: DC | PRN
  Administered 2025-01-19: 2 mg via INTRAVENOUS
  Filled 2025-01-17: qty 1

## 2025-01-17 MED ORDER — POTASSIUM CHLORIDE CRYS ER 20 MEQ PO TBCR
40.0000 meq | EXTENDED_RELEASE_TABLET | ORAL | Status: AC
  Administered 2025-01-17 (×2): 40 meq via ORAL
  Filled 2025-01-17 (×2): qty 2

## 2025-01-17 MED ORDER — ACETAMINOPHEN 325 MG PO TABS
650.0000 mg | ORAL_TABLET | Freq: Four times a day (QID) | ORAL | Status: DC
  Administered 2025-01-17 – 2025-01-20 (×11): 650 mg via ORAL
  Filled 2025-01-17 (×12): qty 2

## 2025-01-17 MED ORDER — IPRATROPIUM-ALBUTEROL 0.5-2.5 (3) MG/3ML IN SOLN
3.0000 mL | RESPIRATORY_TRACT | Status: DC | PRN
  Administered 2025-01-20: 3 mL via RESPIRATORY_TRACT
  Filled 2025-01-17: qty 3

## 2025-01-17 NOTE — Evaluation (Signed)
 Physical Therapy Evaluation Patient Details Name: [name] MRN: [id number] DOB: 02/07/1946 Today's Date: 01/17/2025  History of Present Illness  [name] is a 79 y.o. female who presented to the ED due to cramping RLQ abdominal pain with associated abdominal distension, black stools, nausea, vomiting that started yesterday. Found to have periappendiceal abscess with IR drain placed, not a candidate for sx due to co -morbidities. 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 Currently she is limited by generalized weakness, balance deficits, and poor activity tolerance. She will continue to benefit from acute PT with follow up from intensive inpatient follow-up therapy, >3 hours/day. Pt has a very supportive husb       If plan is discharge home, recommend the following: A little help with walking and/or transfers;A little help with bathing/dressing/bathroom;Assistance with cooking/housework;Assist for transportation;Help with stairs or ramp for entrance   Can travel by private vehicle        Equipment Recommendations None recommended by PT  Recommendations for Other Services       Functional Status Assessment Patient has had a recent decline in their functional status and demonstrates the ability to make significant improvements in function in a reasonable and predictable amount of time.     Precautions / Restrictions Precautions Precautions: Fall Precaution/Restrictions Comments: monitor O2 Restrictions Weight Bearing Restrictions Per Provider Order: No      Mobility  Bed Mobility Overal bed mobility: Needs Assistance Bed Mobility: Rolling, Sidelying to Sit Rolling: Contact guard  assist Sidelying to sit: Min assist, Mod assist       General bed mobility comments: cues for log roll; significant assist to bring trunk to upright    Transfers Overall transfer level: Needs assistance Equipment used: Rolling walker (2 wheels) Transfers: Sit to/from Stand Sit to Stand: Min assist, +2 safety/equipment           General transfer comment: cues for LE management and use of UEs to self assist    Ambulation/Gait Ambulation/Gait assistance: Min assist, +2 safety/equipment Gait Distance (Feet): 58 Feet Assistive device: Rolling walker (2 wheels) Gait Pattern/deviations: Step-to pattern, Step-through pattern, Decreased step length - right, Decreased step length - left, Shuffle, Trunk flexed Gait velocity: decr     General Gait Details: cues for posture, position from RW and safety awareness; distance ltd by fatigue  Stairs            Wheelchair Mobility     Tilt Bed    Modified Rankin (Stroke Patients Only)       Balance Overall balance assessment: Needs assistance Sitting-balance support: No upper extremity supported, Feet supported Sitting balance-Leahy Scale: Fair     Standing balance support: Bilateral upper extremity supported, Reliant on assistive device for balance Standing balance-Leahy Scale: Poor                               Pertinent Vitals/Pain Pain Assessment Pain Assessment: No/denies pain    Home Living Family/patient expects to be discharged to:: Inpatient rehab Living Arrangements: Spouse/significant other Available Help at Discharge: Family;Available 24 hours/day Type of Home: House Home Access: Ramped entrance     Alternate  Level Stairs-Number of Steps: stair lift Home Layout: Two level;Bed/bath upstairs Home Equipment: Rolling Walker (2 wheels);Rollator (4 wheels);Shower seat Additional Comments: pt has rollator downstairs and RW upstairs    Prior Function Prior Level of Function : Needs assist        Physical Assist : Mobility (physical) Mobility (physical): Stairs   Mobility Comments: rollator downstairs, RW upstairs ADLs Comments: mostly Mod I with ADLs     Extremity/Trunk Assessment   Upper Extremity Assessment Upper Extremity Assessment: Right hand dominant RUE Deficits / Details: R TSA history RUE Coordination: decreased gross motor LUE Deficits / Details: needs L TSA per pt LUE Coordination: decreased gross motor    Lower Extremity Assessment Lower Extremity Assessment: Generalized weakness    Cervical / Trunk Assessment Cervical / Trunk Assessment: Kyphotic  Communication   Communication Communication: No apparent difficulties    Cognition Arousal: Alert Behavior During Therapy: WFL for tasks assessed/performed                             Following commands: Intact       Cueing Cueing Techniques: Verbal cues     General Comments      Exercises     Assessment/Plan    PT Assessment Patient needs continued PT services  PT Problem List Decreased strength;Decreased range of motion;Decreased activity tolerance;Decreased balance;Decreased mobility;Decreased knowledge of use of DME       PT Treatment Interventions DME instruction;Gait training;Stair training;Functional mobility training;Therapeutic activities;Therapeutic exercise;Balance training;Patient/family education    PT Goals (Current goals can be found in the Care Plan section)  Acute Rehab PT Goals Patient Stated Goal: Regain IND PT Goal Formulation: With patient Time For Goal Achievement: 01/31/25 Potential to Achieve Goals: Good    Frequency Min 3X/week     Co-evaluation PT/OT/SLP Co-Evaluation/Treatment: Yes Reason for Co-Treatment: For patient/therapist safety;To address functional/ADL transfers PT goals addressed during session: Mobility/safety with mobility         AM-PAC PT 6 Clicks Mobility  Outcome Measure Help needed turning from your back to your side  while in a flat bed without using bedrails?: A Lot Help needed moving from lying on your back to sitting on the side of a flat bed without using bedrails?: A Lot Help needed moving to and from a bed to a chair (including a wheelchair)?: A Lot Help needed standing up from a chair using your arms (e.g., wheelchair or bedside chair)?: A Lot Help needed to walk in hospital room?: A Lot Help needed climbing 3-5 steps with a railing? : Total 6 Click Score: 11    End of Session Equipment Utilized During Treatment: Gait belt;Oxygen Activity Tolerance: Patient tolerated treatment well;Patient limited by fatigue Patient left: in chair;with call bell/phone within reach;with chair alarm set Nurse Communication: Mobility status PT Visit Diagnosis: Unsteadiness on feet (R26.81);Muscle weakness (generalized) (M62.81);Difficulty in walking, not elsewhere classified (R26.2)    Time: 8879-8847 PT Time Calculation (min) (ACUTE ONLY): 32 min   Charges:   PT Evaluation $PT Eval Low Complexity: 1 Low   PT General Charges $$ ACUTE PT VISIT: 1 Visit         [facility] PT Acute Rehabilitation Services Office [phone]   Bryar Dahms 01/17/2025, 1:34 PM

## 2025-01-17 NOTE — Progress Notes (Signed)
 Subjective: Patient seen and examined.  Chart reviewed.  She denies any GI symptoms at the present time. She is complaining of worsening shortness of breath.  She taken her oxygen off while she was eating breakfast.  She she has a bowel movement 2 days ago as per her husband.  Patient cannot remember when her last BM was.  Objective: Vital signs in last 24 hours: Temp:  [97.5 F (36.4 C)-98.7 F (37.1 C)] 98.7 F (37.1 C) (01/24 0725) Pulse Rate:  [62-157] 95 (01/24 0800) Resp:  [16-24] 21 (01/24 0800) BP: (91-128)/(45-86) 101/69 (01/24 0800) SpO2:  [92 %-100 %] 99 % (01/24 0800) Last BM Date : 01/14/25  Intake/Output from previous day: 01/23 0701 - 01/24 0700 In: 1395.5 [P.O.:350; I.V.:703.8; IV Piggyback:336.7] Out: 325 [Urine:300; Drains:25] Intake/Output this shift: No intake/output data recorded.  General appearance: alert, cooperative, appears older than stated age, fatigued, and mild distress Resp: rhonchi bibasilar and wheezes anterior - bilateral and bases Cardio: regular rate and rhythm, S1, S2 normal, no murmur, click, rub or gallop GI: soft, non-tender; bowel sounds normal; no masses,  no organomegaly  Lab Results: Recent Labs    01/15/25 0830 01/16/25 0815 01/17/25 0649  WBC 77.7* 87.6* 80.1*  HGB 9.1* 9.7* 8.6*  HCT 29.9* 32.0* 26.8*  PLT 150 172 174   BMET Recent Labs    01/15/25 0830 01/16/25 0815 01/17/25 0649  NA 136 135 138  K 3.8 3.8 3.3*  CL 107 106 107  CO2 20* 19* 19*  GLUCOSE 96 92 107*  BUN 12 11 11   CREATININE 0.89 0.99 1.02*  CALCIUM  6.6* 6.6* 7.0*   LFT Recent Labs    01/15/25 0830 01/16/25 0815 01/17/25 0649  PROT 5.2*  --   --   ALBUMIN  2.8*   < > 3.1*  AST 19  --   --   ALT 9  --   --   ALKPHOS 82  --   --   BILITOT 0.4  --   --    < > = values in this interval not displayed.   PT/INR Recent Labs    01/15/25 0830  LABPROT 22.2*  INR 1.8*   Studies/Results: CT GUIDED PERITONEAL/RETROPERITONEAL FLUID DRAIN BY PERC  CATH Result Date: 01/15/2025 INDICATION: 79 year old female with right lower quadrant fluid collection concerning for abscess, likely perforated appendicitis. EXAM: CT PERC DRAIN PERITONEAL ABCESS COMPARISON:  01/14/2025 MEDICATIONS: The patient is currently admitted to the hospital and receiving intravenous antibiotics. The antibiotics were administered within an appropriate time frame prior to the initiation of the procedure. ANESTHESIA/SEDATION: Moderate (conscious) sedation was employed during this procedure. A total of Versed  2 mg and Fentanyl  100 mcg was administered intravenously. Moderate Sedation Time: 11 minutes. The patient's level of consciousness and vital signs were monitored continuously by radiology nursing throughout the procedure under my direct supervision. CONTRAST:  None COMPLICATIONS: None immediate. PROCEDURE: RADIATION DOSE REDUCTION: This exam was performed according to the departmental dose-optimization program which includes automated exposure control, adjustment of the mA and/or kV according to patient size and/or use of iterative reconstruction technique. Informed written consent was obtained from the patient after a discussion of the risks, benefits and alternatives to treatment. The patient was placed supine on the CT gantry and a pre procedural CT was performed re-demonstrating the known abscess/fluid collection within the right lower quadrant. The procedure was planned. A timeout was performed prior to the initiation of the procedure. The right lower quadrant was prepped and draped in the  usual sterile fashion. The overlying soft tissues were anesthetized with 1% lidocaine  with epinephrine . Appropriate trajectory was planned with the use of a 22 gauge spinal needle. An 18 gauge trocar needle was advanced into the abscess/fluid collection and a short Amplatz super stiff wire was coiled within the collection. Appropriate positioning was confirmed with a limited CT scan. The tract was  serially dilated allowing placement of a 8 French mini loop drainage catheter. Appropriate positioning was confirmed with a limited postprocedural CT scan. Twenty ml of purulent fluid was aspirated. The tube was connected to a bulb suction and sutured in place. A dressing was placed. The patient tolerated the procedure well without immediate post procedural complication. IMPRESSION: Successful CT guided placement of a 8 French mini loop drain catheter into the periappendiceal abscess with aspiration of 20 mL of purulent fluid. Samples were sent to the laboratory as requested by the ordering clinical team. Ester Sides, MD [facility] Radiology Electronically Signed   By: Ester Sides M.D.   On: 01/15/2025 13:47    Medications: I have reviewed the patient's current medications. Prior to Admission:  Medications Prior to Admission  Medication Sig Dispense Refill Last Dose/Taking   acetaminophen  (TYLENOL ) 500 MG tablet Take 2 tablets (1,000 mg total) by mouth 3 (three) times daily. (Patient taking differently: Take 1,000 mg by mouth daily at 12 noon.) 30 tablet 0 01/11/2025 Noon   apixaban  (ELIQUIS ) 5 MG TABS tablet TAKE 1 TABLET(5 MG) BY MOUTH TWICE DAILY 60 tablet 5 01/11/2025 at 12:00 PM   azaCITIDine  (VIDAZA ) 100 MG SUSR Inject 114 mg into the vein every 6 (six) weeks.   Past Month   CLARITIN  10 MG tablet Take 10 mg by mouth daily.   01/11/2025 Morning   denosumab  (PROLIA ) 60 MG/ML SOSY injection Inject 60 mg into the skin every 6 (six) months.   Unknown   diltiazem  (CARDIZEM  CD) 360 MG 24 hr capsule Take 1 capsule (360 mg total) by mouth daily. 90 capsule 0 01/11/2025 Morning   furosemide  (LASIX ) 20 MG tablet Take 20 mg by mouth in the morning.   01/11/2025 Morning   gabapentin  (NEURONTIN ) 300 MG capsule Take 300 mg by mouth in the morning.   01/11/2025 Noon   inFLIXimab  (REMICADE ) 100 MG injection Inject into the vein every 8 (eight) weeks.   Unknown    Metoprolol  Tartrate 75 MG TABS Take 2 tablets (150 mg total) by mouth 2 (two) times daily. 360 tablet 3 01/11/2025 Morning   ondansetron  (ZOFRAN ) 4 MG tablet Take 1 tablet (4 mg total) by mouth every 8 (eight) hours as needed for nausea or vomiting. 20 tablet 1 01/11/2025 Noon   predniSONE  (DELTASONE ) 5 MG tablet Take 5 mg by mouth daily with breakfast.   01/11/2025 Morning   Tiotropium Bromide-Olodaterol (STIOLTO RESPIMAT ) 2.5-2.5 MCG/ACT AERS Inhale 2 puffs into the lungs daily. 4 g 11 01/11/2025 Morning   furosemide  (LASIX ) 20 MG tablet Take 1 tablet (20 mg total) by mouth daily. (Patient not taking: Reported on 01/12/2025) 30 tablet 11 Not Taking   nitrofurantoin , macrocrystal-monohydrate, (MACROBID ) 100 MG capsule Take 1 capsule (100 mg total) by mouth 2 (two) times daily. (Patient not taking: Reported on 01/12/2025) 14 capsule 0 Not Taking   Scheduled:  arformoterol   15 mcg Nebulization BID   And   umeclidinium bromide   1 puff Inhalation Daily   Chlorhexidine  Gluconate Cloth  6 each Topical Daily   gabapentin   300 mg Oral Daily   lactose  free nutrition  237 mL Oral BID BM   metoprolol  tartrate  25 mg Oral TID   pantoprazole  (PROTONIX ) IV  40 mg Intravenous BID   polyethylene glycol  17 g Oral BID   potassium chloride   40 mEq Oral Q3H   predniSONE   5 mg Oral Q breakfast   sodium chloride  flush  10-40 mL Intracatheter Q12H   sodium chloride  flush  5 mL Intracatheter Q8H   Continuous:  amiodarone  60 mg/hr (01/17/25 0741)   ceFEPime  (MAXIPIME ) IV Stopped (01/17/25 0705)   metronidazole  Stopped (01/17/25 0407)   PRN:acetaminophen  **OR** acetaminophen , ipratropium-albuterol , lip balm, morphine  injection, ondansetron  **OR** ondansetron  (ZOFRAN ) IV, mouth rinse, oxyCODONE   Assessment/Plan: 1) History of melena with anemia-patient is overall respiratory status is very poor and therefore no invasive procedures can be attempted at this time unless is absolutely necessary.  There does not seem to  be any ongoing evidence of GI blood loss at the present time; continue to monitor closely. 2) Acute appendicitis with periappendiceal abscess with markedly elevated WBC count drain placement on 01/15/2025 on IV Cefepime  and Flagyl . 3) Persistent A-fib with RVR/chronic heart failure with preserved ejection fraction-the Eliquis  is currently on hold. 4) Moderate pericardial effusion/moderate bilateral pleural effusions. 5) CMML with elevated WBC count. 6) Chronic COPD. 7) History of rheumatoid arthritis on Biologics. 8) Cholelithiasis with possible choledocholithiasis noted on initial scan but no associated symptoms at the present time. 9) History of alcohol  use with thrombocytopenia. 10) Hypocalcemia/hypophosphatemia.  LOS: 5 days   Renaye Sous 01/17/2025, 8:59 AM

## 2025-01-17 NOTE — Consult Note (Incomplete)
 "                                                  Palliative Care Consult Note                                  Date: 01/17/2025   Patient Name: [name]  DOB: 1946-05-10  MRN: [id number]  Age / Sex: 79 y.o., female  PCP: [name], MD Referring Physician: Kathrin Mignon DASEN, MD  Reason for Consultation: Establishing goals of care  HPI/Patient Profile: 79 y.o. female  with past medical history of CML on chemotherapy, rheumatoid arthritis, A-fib on Eliquis , chronic HFmrEF, COPD, CKD 3A, lung nodules, and sacral mass who presented to the ED on 01/12/2025 with abdominal pain, distention, melanotic stool, and nausea and vomiting.  She is admitted with acute appendicitis with periappendiceal abscess, A-fib with RVR, and possible choledocholithiasis. On 1/22, she had IR drain placement in the periappendiceal abscess.  Palliative Medicine has been consulted for goals of care discussions and complex medical decision making.   Clinical Assessment and Goals of Care:   Extensive chart review has been completed prior including labs, vital signs, imaging, progress/consult notes, orders, medications and available advance directive documents.    I met with patient and husband at bedside to discuss diagnosis, prognosis, GOC, disposition, and options. Patient is alert/oriented and denies pain. She was having some shortness of breath earlier today, but states this has improved. She remains on an amiodarone  infusion.   Patient has been previously seen by PMT (most recent progress note was January 2025). I re-introduced Palliative Medicine as specialized medical care for people living with serious illness. It focuses on providing relief from the symptoms and stress of a serious illness.   Created space and opportunity for patient and husband to express thoughts and feelings regarding current medical situation. Values and goals of care were attempted to be elicited.  Life Review: Patient was born in  [place], when her father was stationed there during his service in the [employer]. She then lived in [place], and later relocated to [place] [facility] for her husband's job.  Patient and husband have been married for 60 years. They have 1 daughter. They own a art gallery manager business.   Functional Status: Prior to admission, was completely independent with ADLs and mobility. Currently limited by generalized weakness and fatigue.   Discussion: We discussed patient's current medical situation and what it means in the larger context of her ongoing co-morbidities. Current clinical status was reviewed.   We reviewed her main issues including acute appendicitis with periappendiceal abscess and a-fib with RVR. 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 Patient and husband understand that given her co-morbidities, she is very high risk for any surgical intervention.   A discussion was had today regarding advanced directives. We discussed code status and scopes of care. We discussed the difference between full scope versus limited interventions versus comfort care. The MOST form was introduced.  Encouraged patient to consider what medical interventions patient would or would not want in the event her condition were to deteriorate. Encouraged consideration of DNR status and provided education on evidence-based poor outcomes in similar hospitalized patients, as the cause of cardiac arrest would likely be associated with advanced illness rather than  a reversible condition.  Husband states that they have completed advanced directive documents, and he thinks DNR status would be consistent with patient's wishes as per their previous discussions. However patient herself is unsure and wants to remain full code for now. She does speak to the importance of quality of life, and indicates she would not want to be resuscitated if she was significantly debilitated and there was minimal chance for meaningful recovery.   For  now, patient remains open to all offered and available medical interventions to prolong life and ultimately is hopeful for improvement.  We discussed PT/OT recommendations for inpatient rehab (CIR) to improve functional status. Patient and husband would agree to CIR, but would likely not agree to rehab at a SNF (as they had a very bad experience several years ago).   Discussed the importance of continued conversation with the medical team regarding overall plan of care and treatment options. Questions and concerns addressed. Emotional support provided.   Review of Systems  Constitutional:  Positive for fatigue.    Objective:   Primary Diagnoses: Present on Admission:  Appendicitis  Acute appendicitis with appendiceal abscess  Atrial fibrillation with rapid ventricular response (HCC)  Protein-calorie malnutrition, moderate   Physical Exam Vitals reviewed.  Constitutional:      General: She is not in acute distress.    Appearance: She is ill-appearing.  Cardiovascular:     Rate and Rhythm: Normal rate. Rhythm irregularly irregular.  Pulmonary:     Effort: Pulmonary effort is normal.  Neurological:     Mental Status: She is alert and oriented to person, place, and time.    Palliative Assessment/Data: PPS 40%     Assessment & Plan:   SUMMARY OF RECOMMENDATIONS   Continue full scope care Goal of care is medical stabilization and recovery Patient and husband are hopeful she is accepted to [facility] inpatient rehab (CIR) Ongoing palliative support  Primary Decision Maker: PATIENT  Existing Vynca/ACP Documentation: None - I have encouraged husband to bring any AD documents  Code Status: Full code  Discharge Planning:  To Be Determined    Thank you for allowing us  to participate in the care of [name]   I personally spent a total of 75 minutes in the care of the patient today including preparing to see the patient, getting/reviewing separately obtained history,  performing a medically appropriate exam/evaluation, counseling and educating, referring and communicating with other health care professionals, and documenting clinical information in the EHR.   Signed by: Recardo Loll, NP Palliative Medicine Team  Team Phone # [phone]  For individual providers, please see AMION                "

## 2025-01-17 NOTE — Progress Notes (Signed)

## 2025-01-17 NOTE — Progress Notes (Signed)
 "  Progress Note     Subjective:  Shortness of breath this morning  ROS  All negative with the exception of above.  Objective: Vital signs in last 24 hours: Temp:  [97.5 F (36.4 C)-98.7 F (37.1 C)] 98.7 F (37.1 C) (01/24 0725) Pulse Rate:  [62-157] 62 (01/23 2215) Resp:  [16-24] 22 (01/23 2215) BP: (91-128)/(47-86) 95/47 (01/23 2215) SpO2:  [92 %-100 %] 100 % (01/23 2215) Last BM Date : 01/14/25  Intake/Output from previous day: 01/23 0701 - 01/24 0700 In: 1395.5 [P.O.:350; I.V.:703.8; IV Piggyback:336.7] Out: 325 [Urine:300; Drains:25] Intake/Output this shift: No intake/output data recorded.  PE: General: Pleasant chronically ill appearing female who is laying in bed in NAD. Chest: Tachypneic and dyspneic, but saturating 100% Abd: Soft, nondistended.  Minimal tenderness to palpation of RLQ.  No peritoneal signs.  Drain output is serosanguineous this morning Skin: Warm and dry.  Psych: A&Ox3 with an appropriate affect.    Lab Results:  Recent Labs    01/16/25 0815 01/17/25 0649  WBC 87.6* 80.1*  HGB 9.7* 8.6*  HCT 32.0* 26.8*  PLT 172 174   BMET Recent Labs    01/16/25 0815 01/17/25 0649  NA 135 138  K 3.8 3.3*  CL 106 107  CO2 19* 19*  GLUCOSE 92 107*  BUN 11 11  CREATININE 0.99 1.02*  CALCIUM  6.6* 7.0*   PT/INR Recent Labs    01/15/25 0830  LABPROT 22.2*  INR 1.8*   CMP     Component Value Date/Time   NA 138 01/17/2025 0649   NA 141 01/04/2016 1001   K 3.3 (L) 01/17/2025 0649   K 4.5 01/04/2016 1001   CL 107 01/17/2025 0649   CO2 19 (L) 01/17/2025 0649   CO2 24 01/04/2016 1001   GLUCOSE 107 (H) 01/17/2025 0649   GLUCOSE 105 01/04/2016 1001   BUN 11 01/17/2025 0649   BUN 21.7 01/04/2016 1001   CREATININE 1.02 (H) 01/17/2025 0649   CREATININE 1.48 (H) 12/15/2024 1057   CREATININE 1.2 (H) 01/04/2016 1001   CALCIUM  7.0 (L) 01/17/2025 0649   CALCIUM  9.7 01/04/2016 1001   PROT 5.2 (L) 01/15/2025 0830   PROT 7.0 01/04/2016 1001    ALBUMIN  3.1 (L) 01/17/2025 0649   ALBUMIN  4.0 01/04/2016 1001   AST 19 01/15/2025 0830   AST 21 12/21/2023 1359   AST 26 01/04/2016 1001   ALT 9 01/15/2025 0830   ALT 14 12/21/2023 1359   ALT 24 01/04/2016 1001   ALKPHOS 82 01/15/2025 0830   ALKPHOS 61 01/04/2016 1001   BILITOT 0.4 01/15/2025 0830   BILITOT 0.8 12/21/2023 1359   BILITOT 0.41 01/04/2016 1001   GFRNONAA 56 (L) 01/17/2025 0649   GFRNONAA 36 (L) 12/15/2024 1057   GFRAA 56 (L) 01/09/2020 1339   Lipase     Component Value Date/Time   LIPASE 26 01/12/2025 1123       Studies/Results: CT GUIDED PERITONEAL/RETROPERITONEAL FLUID DRAIN BY PERC CATH Result Date: 01/15/2025 INDICATION: 79 year old female with right lower quadrant fluid collection concerning for abscess, likely perforated appendicitis. EXAM: CT PERC DRAIN PERITONEAL ABCESS COMPARISON:  01/14/2025 MEDICATIONS: The patient is currently admitted to the hospital and receiving intravenous antibiotics. The antibiotics were administered within an appropriate time frame prior to the initiation of the procedure. ANESTHESIA/SEDATION: Moderate (conscious) sedation was employed during this procedure. A total of Versed  2 mg and Fentanyl  100 mcg was administered intravenously. Moderate Sedation Time: 11 minutes. The patient's level of consciousness and vital  signs were monitored continuously by radiology nursing throughout the procedure under my direct supervision. CONTRAST:  None COMPLICATIONS: None immediate. PROCEDURE: RADIATION DOSE REDUCTION: This exam was performed according to the departmental dose-optimization program which includes automated exposure control, adjustment of the mA and/or kV according to patient size and/or use of iterative reconstruction technique. Informed written consent was obtained from the patient after a discussion of the risks, benefits and alternatives to treatment. The patient was placed supine on the CT gantry and a pre procedural CT was  performed re-demonstrating the known abscess/fluid collection within the right lower quadrant. The procedure was planned. A timeout was performed prior to the initiation of the procedure. The right lower quadrant was prepped and draped in the usual sterile fashion. The overlying soft tissues were anesthetized with 1% lidocaine  with epinephrine . Appropriate trajectory was planned with the use of a 22 gauge spinal needle. An 18 gauge trocar needle was advanced into the abscess/fluid collection and a short Amplatz super stiff wire was coiled within the collection. Appropriate positioning was confirmed with a limited CT scan. The tract was serially dilated allowing placement of a 8 French mini loop drainage catheter. Appropriate positioning was confirmed with a limited postprocedural CT scan. Twenty ml of purulent fluid was aspirated. The tube was connected to a bulb suction and sutured in place. A dressing was placed. The patient tolerated the procedure well without immediate post procedural complication. IMPRESSION: Successful CT guided placement of a 8 French mini loop drain catheter into the periappendiceal abscess with aspiration of 20 mL of purulent fluid. Samples were sent to the laboratory as requested by the ordering clinical team. Ester Sides, MD [facility] Electronically Signed   By: Ester Sides M.D.   On: 01/15/2025 13:47   ECHOCARDIOGRAM COMPLETE Result Date: 01/15/2025    ECHOCARDIOGRAM REPORT   Patient Name:   [name] [facility] Date of Exam: 01/15/2025 Medical Rec #:  [id number]        Height:       61.0 in Accession #:    [id number]       Weight:       105.2 lb Date of Birth:  08/06/1946       BSA:          1.437 m Patient Age:    78 years         BP:           99/53 mmHg Patient Gender: F                HR:           97 bpm. Exam Location:  Inpatient Procedure: 2D Echo, Cardiac Doppler and Color Doppler (Both Spectral and Color             Flow Doppler were utilized during procedure). Indications:    Atrial Flutter  History:        Patient has prior history of Echocardiogram examinations, most                 recent 03/30/2022. COPD; Arrythmias:Atrial Fibrillation.  Sonographer:    Philomena Daring Referring Phys: [id number] ZANE ADAMS IMPRESSIONS  1. Left ventricular ejection fraction, by estimation, is 50 to 55%. The left ventricle has low normal function. The left ventricle has no regional wall motion abnormalities. Left ventricular diastolic function could not be evaluated.  2. Right ventricular systolic function is low normal. The right ventricular size is normal. Mildly increased right  ventricular wall thickness. There is normal pulmonary artery systolic pressure.  3. Moderate size pericardial effusion (best seen subcostal view, posterior to the LV, respiratory variations across the MV and TV not obtained, IVC is dilated but compressible, not suggestive of tamponade physiology, clinical correlation required). Large pleural effusion in the left lateral region.  4. The mitral valve is degenerative. Mild mitral valve regurgitation. No evidence of mitral stenosis.  5. The aortic valve was not well visualized. Unable to determine aortic valve morphology due to image quality. Aortic valve regurgitation is not visualized. No aortic stenosis is present.  6. The inferior vena cava is dilated in size with >50% respiratory variability, suggesting right atrial pressure of 8 mmHg. Comparison(s): A prior study was performed on 03/30/2022. LVEF 45-50%, RV function mildly reduced, mild/moderate MR, estimated RAP . Pericardial and pleural effusion are new compared to prior study. Conclusion(s)/Recommendation(s): Consider repeat limited echo in 24-48hrs to evaluate the pericaridal effusion and obtain images to evaluate for tamponade physiology if clinically indicated. FINDINGS  Left Ventricle: Left ventricular ejection fraction, by estimation, is 50 to 55%. The left  ventricle has low normal function. The left ventricle has no regional wall motion abnormalities. The left ventricular internal cavity size was small. There is no left ventricular hypertrophy. Left ventricular diastolic function could not be evaluated due to atrial fibrillation. Left ventricular diastolic function could not be evaluated. Right Ventricle: The right ventricular size is normal. Mildly increased right ventricular wall thickness. Right ventricular systolic function is low normal. There is normal pulmonary artery systolic pressure. The tricuspid regurgitant velocity is 2.63 m/s, and with an assumed right atrial pressure of 8 mmHg, the estimated right ventricular systolic pressure is 35.7 mmHg. Left Atrium: Left atrial size was normal in size. Right Atrium: Right atrial size was normal in size. Pericardium: Moderate size pericardial effusion (best seen subcostal view, posterior to the LV, respiratory variations across the MV and TV not obtained, IVC is dilated but compressible, not suggestive of tamponade physiology, clinical correlation required). Mitral Valve: The mitral valve is degenerative in appearance. Mild mitral valve regurgitation. No evidence of mitral valve stenosis. Tricuspid Valve: The tricuspid valve is grossly normal. Tricuspid valve regurgitation is mild . No evidence of tricuspid stenosis. Aortic Valve: The aortic valve was not well visualized. Aortic valve regurgitation is not visualized. No aortic stenosis is present. Pulmonic Valve: The pulmonic valve was not well visualized. Pulmonic valve regurgitation is not visualized. Aorta: The aortic root and ascending aorta are structurally normal, with no evidence of dilitation. Venous: The inferior vena cava is dilated in size with greater than 50% respiratory variability, suggesting right atrial pressure of 8 mmHg. IAS/Shunts: The atrial septum is grossly normal. Additional Comments: There is a large pleural effusion in the left lateral  region.  LEFT VENTRICLE PLAX 2D LVIDd:         3.20 cm   Diastology LVIDs:         2.30 cm   LV e' medial:    8.12 cm/s LV PW:         0.80 cm   LV E/e' medial:  13.6 LV IVS:        0.90 cm   LV e' lateral:   10.47 cm/s LVOT diam:     1.80 cm   LV E/e' lateral: 10.6 LV SV:         39 LV SV Index:   27 LVOT Area:     2.54 cm  RIGHT VENTRICLE  IVC RV S prime:     10.10 cm/s  IVC diam: 2.10 cm TAPSE (M-mode): 1.4 cm LEFT ATRIUM             Index        RIGHT ATRIUM           Index LA diam:        3.40 cm 2.37 cm/m   RA Area:     17.10 cm LA Vol (A2C):   40.2 ml 27.97 ml/m  RA Volume:   40.30 ml  28.04 ml/m LA Vol (A4C):   38.4 ml 26.72 ml/m LA Biplane Vol: 41.8 ml 29.08 ml/m  AORTIC VALVE LVOT Vmax:   104.00 cm/s LVOT Vmean:  67.000 cm/s LVOT VTI:    0.155 m  AORTA Ao Root diam: 2.30 cm Ao Asc diam:  2.30 cm MITRAL VALVE                TRICUSPID VALVE MV Area (PHT): 8.34 cm     TR Peak grad:   27.7 mmHg MV Decel Time: 91 msec      TR Vmax:        263.00 cm/s MV E velocity: 110.67 cm/s                             SHUNTS                             Systemic VTI:  0.16 m                             Systemic Diam: 1.80 cm Sunit Tolia Electronically signed by Madonna Large Signature Date/Time: 01/15/2025/10:48:44 AM    Final     Anti-infectives: Anti-infectives (From admission, onward)    Start     Dose/Rate Route Frequency Ordered Stop   01/14/25 0600  ceFEPIme  (MAXIPIME ) 2 g in sodium chloride  0.9 % 100 mL IVPB        2 g 200 mL/hr over 30 Minutes Intravenous Every 12 hours 01/13/25 1629     01/12/25 1830  metroNIDAZOLE  (FLAGYL ) IVPB 500 mg        500 mg 100 mL/hr over 60 Minutes Intravenous Every 12 hours 01/12/25 1822     01/12/25 1830  ceFEPIme  (MAXIPIME ) 2 g in sodium chloride  0.9 % 100 mL IVPB  Status:  Discontinued        2 g 200 mL/hr over 30 Minutes Intravenous Every 8 hours 01/12/25 1825 01/13/25 1629   01/12/25 1445  ceFEPIme  (MAXIPIME ) 2 g in sodium chloride  0.9 % 100 mL IVPB        Placed in And Linked Group   2 g 200 mL/hr over 30 Minutes Intravenous  Once 01/12/25 1436 01/12/25 1533   01/12/25 1445  metroNIDAZOLE  (FLAGYL ) IVPB 500 mg       Placed in And Linked Group   500 mg 100 mL/hr over 60 Minutes Intravenous  Once 01/12/25 1436 01/12/25 1606        Assessment/Plan Periappendiceal abscess -Reviewed with IR 1/19, plan at that time for repeat CT in a few days- CT 1/22 with minimal change and subsequently IR drain placed successfully -Afebrile, WBC remains markedly elevated -Continue with IV abx  -Given her frailty and multiple medical problems including recent chemo, daily steroid use, Eliquis , active A-fib with RVR and hypotension  etc, we pan to continue to treat with conservative management at this time and avoid surgical intervention if able. She is extremely high risk.  - New shortness of breath: X-ray and hospitalist are at bedside.  She did have pleural effusions on presentation, suspect some pulmonary edema and increased effusions given ongoing A-fib and intra-abdominal process   FEN -D3 diet; IVFs per primary team VTE - Hold Eliquis , hold addition of heparin  gtt due to melena ID - Cefipime/Flagyl    UGI Bleed/Melena/anemia - unclear etiology.  No history of ulcer disease or NSAID use.  Her hgb appears stable at this time and she is HDS stable considering her Afib with RVR.  Will need GI eval  Choledocholithiasis - seems to be asymptomatic.  LFTs are normal indicating likely this to be nonocclusive.  Monitor LFTs.  At some point, will likely need this addressed, but as of now this is not the most acute problem. 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 WBC baseline prior to admission ranging from 25-47 CHF/ cardiomyopathy A fib, RVR - cardiology consulted, echo pending. 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  This care required high level of medical decision making.   Sherry DELENA Freund, MD  [facility] 01/17/2025, 8:25 AM Please see Amion for pager number during day hours 7:00am-4:30pm  "

## 2025-01-17 NOTE — Progress Notes (Signed)
 " PROGRESS NOTE  [name] FMW:[id number] DOB: 04/09/1946   PCP: [name], MD  Patient is from: Home.  Lives with husband.  Uses rolling walker for mobility.  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    In ED, in A-fib with RVR with HR from 120s to 140s.  WBC 84.9 (baseline 40-50).  Hgb 10 (baseline 9-10).  Fecal Hemoccult positive.  CT abdomen and pelvis showed acute appendicitis with periappendiceal abscess, cholelithiasis with linear calcification of the distal CBD suspicious for choledocholithiasis.  General surgery consulted.  Patient was started on IV cefepime  and Flagyl , IV fluids and admitted for further care.  The next day, started on IV Cardizem .  Remained in RVR despite IV Cardizem .  Transitioned to IV amiodarone .  Cardiology following.  Repeat CT abdomen and pelvis on 1/21 without significant change.  Surgery consulted IR for possible periappendiceal abscess drainage, also recommended GI consult for anemia and possible choledocholithiasis.  She had drain placed by IR on 1/22.  Abscess culture with rare GNR.  Remains on IV cefepime  and Flagyl .  GI consulted for anemia and possible choledocholithiasis.   Subjective: Seen and examined earlier this morning.  No major events overnight or this morning.  Reports shortness of breath and dry cough this morning.  Denies chest pain.  No bowel movement in the last 24 hours.  Assessment and plan: Acute appendicitis with periappendiceal abscess-presents with RLQ pain, nausea and vomiting.   Markedly elevated leukocytosis above baseline.  Repeat CT without significant change. -Deemed to be high risk for surgery with underlying comorbidity. -S/p drain placement for periappendiceal abscess on 1/22.  Gram stain GNR.  Culture multiple species.   -Continue IV cefepime  and Flagyl . -General Surgery and IR following.   Persistent A-fib with RVR: Remain in RVR despite Cardizem  drip.  Also soft blood pressure.  TTE with LVEF of 50 to 55%, moderate pericardial effusion, large pleural effusion in the left lateral region.  -Now on amiodarone  drip and metoprolol  per cardiology. -Holding Eliquis  due to anemia and melanotic stool  -Optimize electrolytes.  Normocytic anemia: Patient reports melanotic stool.  Heme occult positive but H&H relatively at baseline.  Hgb stable after initial drop.  No overt bleeding.  LBM on 1/22. Recent Labs    09/22/24 1245 11/03/24 1022 11/25/24 0938 12/15/24 1057 01/12/25 1123 01/13/25 0615 01/14/25 0304 01/15/25 0830 01/16/25 0815 01/17/25 0649  HGB 11.2* 10.3* 10.4* 9.5* 10.4* 9.1* 8.6* 9.1* 9.7* 8.6*  -Continue IV Protonix . -Continue holding Eliquis  - Appreciate input by GI  Chronic HFmrEF: TTE  LVEF of 50 to 55% (45 to 50% in 2023), moderate pericardial effusion, large pleural effusion in the left lateral region. Overall, appears euvolemic except for edema.  Seems to be on p.o. Lasix  20 mg daily at home.  Reports shortness of breath and cough this morning. -Check chest x-ray and proBNP. -Intake and output, daily weight, renal functions and electrolytes -Defer diuretics to cardiology   Moderate pericardial effusion: Noted on echocardiogram.   - Defer to cardiology  Moderate bilateral pleural effusion: Noted on CT  abdomen and pelvis.  Echocardiogram also mentioned large left pleural effusion. Patient with history of CHF and anemia and malignancy.  Prior CT in 08/2024 also showed left axillary adenopathy and stable right greater than left paraspinous  and pleural-based masses.  Now with shortness of breath and cough. - Follow chest x-ray-if significant effusion, will consult IR for thoracocentesis.  Cholelithiasis with possible choledocholithiasis: Noted on initial and repeat CT.  No RUQ tenderness. LFT within normal.  I wonder if this is some calcification versus true choledocholithiasis. - GI on board-no plan for procedure.  Physical deconditioning: Patient reports ambulating with walker at baseline. - OOB, PT/OT  Chronic COPD: Stable. 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   CMML with elevated WBC:  - Appreciate input by oncology.   Alcohol  use: No withdrawal symptoms. - Continue monitoring  Back pain: Likely from lying in bed continuously - Scheduled Tylenol  every 6 hours while awake -Oxycodone  as needed -Decrease morphine  to 2 mg every 4 hours as needed severe pain. - OOB, PT/OT  Hypokalemia/hypophosphatemia/hypocalcemia - Monitor replenish as appropriate  Thrombocytopenia: Mild - Continue monitoring  Advance care planning: Full code with full scope of care.   See IPAL note on 1/23.  - Palliative on board.  Moderate malnutrition Body mass index is 19.87 kg/m. 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 Wound 01/13/25 1900 Pressure Injury Buttocks Left;Lower Stage 1 -  Intact skin with non-blanchable redness of a localized area usually over a bony prominence. (Active)   Pressure skin injury: She has dark blister on right leg. -Consult wound care DVT prophylaxis:  SCDs Start: 01/12/25 1820  Code Status: Full code Family Communication: Updated patient's husband at bedside Level of care: Stepdown Status is: Inpatient Remains inpatient appropriate  because: Acute appendicitis with periappendiceal abscess, A-fib with RVR,   Final disposition: Yet to be determined.   55 minutes with more than 50% spent in reviewing records, counseling patient/family and coordinating care.  Consultants:  General Surgery Cardiology Oncology/hematology Gastroenterology Palliative medicine  Procedures: None  Microbiology summarized: None  Objective: Vitals:   01/17/25 0900 01/17/25 1000 01/17/25 1100 01/17/25 1150  BP: (!) 135/54 119/71 (!) 126/55   Pulse: 98 95 90   Resp: (!) 23 (!) 23 (!) 26   Temp:    98.1 F (36.7 C)  TempSrc:    Oral  SpO2: 95% 98% 98%   Weight:      Height:        Examination:  GENERAL: No apparent distress.  Nontoxic. HEENT: MMM.  Vision and hearing grossly intact.  NECK: Supple.  No apparent JVD.  RESP:  No IWOB.  Somewhat diminished aeration bilaterally. CVS: IRR.  HR in 120s.  Heart sounds normal.  ABD/GI/GU: BS+.  Mild RLQ tenderness.  Drain in place. MSK/EXT:  Moves extremities. No apparent deformity.  Trace BLE edema.  SKIN: As above. NEURO: AA.  Oriented appropriately.  No apparent focal neuro deficit. PSYCH: Calm. Normal affect.   Sch Meds:  Scheduled Meds:  arformoterol   15 mcg Nebulization BID   And   umeclidinium bromide   1 puff Inhalation Daily   Chlorhexidine  Gluconate Cloth  6 each Topical Daily   gabapentin   300 mg Oral Daily   lactose free nutrition  237 mL Oral BID BM   metoprolol   tartrate  25 mg Oral TID   pantoprazole  (PROTONIX ) IV  40 mg Intravenous BID   polyethylene glycol  17 g Oral BID   potassium chloride   40 mEq Oral Q3H   predniSONE   5 mg Oral Q breakfast   sodium chloride  flush  10-40 mL Intracatheter Q12H   sodium chloride  flush  5 mL Intracatheter Q8H   Continuous Infusions:  amiodarone  60 mg/hr (01/17/25 0741)   ceFEPime  (MAXIPIME ) IV Stopped (01/17/25 0705)   metronidazole  Stopped (01/17/25 0407)   PRN Meds:.acetaminophen  **OR** acetaminophen ,  ipratropium-albuterol , lip balm, morphine  injection, ondansetron  **OR** ondansetron  (ZOFRAN ) IV, mouth rinse, oxyCODONE   Antimicrobials: Anti-infectives (From admission, onward)    Start     Dose/Rate Route Frequency Ordered Stop   01/14/25 0600  ceFEPIme  (MAXIPIME ) 2 g in sodium chloride  0.9 % 100 mL IVPB        2 g 200 mL/hr over 30 Minutes Intravenous Every 12 hours 01/13/25 1629     01/12/25 1830  metroNIDAZOLE  (FLAGYL ) IVPB 500 mg        500 mg 100 mL/hr over 60 Minutes Intravenous Every 12 hours 01/12/25 1822     01/12/25 1830  ceFEPIme  (MAXIPIME ) 2 g in sodium chloride  0.9 % 100 mL IVPB  Status:  Discontinued        2 g 200 mL/hr over 30 Minutes Intravenous Every 8 hours 01/12/25 1825 01/13/25 1629   01/12/25 1445  ceFEPIme  (MAXIPIME ) 2 g in sodium chloride  0.9 % 100 mL IVPB       Placed in And Linked Group   2 g 200 mL/hr over 30 Minutes Intravenous  Once 01/12/25 1436 01/12/25 1533   01/12/25 1445  metroNIDAZOLE  (FLAGYL ) IVPB 500 mg       Placed in And Linked Group   500 mg 100 mL/hr over 60 Minutes Intravenous  Once 01/12/25 1436 01/12/25 1606        I have personally reviewed the following labs and images: CBC: Recent Labs  Lab 01/13/25 0615 01/14/25 0304 01/15/25 0830 01/16/25 0815 01/17/25 0649  WBC 72.3* 70.3* 77.7* 87.6* 80.1*  NEUTROABS  --  44.3*  --   --   --   HGB 9.1* 8.6* 9.1* 9.7* 8.6*  HCT 29.8* 26.8* 29.9* 32.0* 26.8*  MCV 108.4* 105.5* 109.5* 109.2* 104.7*  PLT 140* 149* 150 172 174   BMP &GFR Recent Labs  Lab 01/12/25 2349 01/13/25 0615 01/14/25 0304 01/15/25 0830 01/16/25 0815 01/17/25 0649  NA  --  134* 136 136 135 138  K  --  4.2 3.8 3.8 3.8 3.3*  CL  --  105 105 107 106 107  CO2  --  21* 24 20* 19* 19*  GLUCOSE  --  73 100* 96 92 107*  BUN  --  16 17 12 11 11   CREATININE  --  0.95 0.98 0.89 0.99 1.02*  CALCIUM   --  6.5* 6.8* 6.6* 6.6* 7.0*  MG 2.4  --  2.2 2.2 2.1 2.1  PHOS  --   --  2.5  --  1.4* 3.2   Estimated  Creatinine Clearance: 34.2 mL/min (A) (by C-G formula based on SCr of 1.02 mg/dL (H)). Liver & Pancreas: Recent Labs  Lab 01/12/25 1123 01/13/25 0615 01/14/25 0304 01/15/25 0830 01/16/25 0815 01/17/25 0649  AST 26 22 19 19   --   --   ALT 12 10 10 9   --   --   ALKPHOS 105 80 86 82  --   --  BILITOT 0.6 0.4 0.4 0.4  --   --   PROT 6.3* 4.9* 5.0* 5.2*  --   --   ALBUMIN  3.5 2.8* 2.9* 2.8* 2.8* 3.1*   Recent Labs  Lab 01/12/25 1123  LIPASE 26   No results for input(s): AMMONIA in the last 168 hours. Diabetic: No results for input(s): HGBA1C in the last 72 hours. No results for input(s): GLUCAP in the last 168 hours. Cardiac Enzymes: No results for input(s): CKTOTAL, CKMB, CKMBINDEX, TROPONINI in the last 168 hours. No results for input(s): PROBNP in the last 8760 hours. Coagulation Profile: Recent Labs  Lab 01/12/25 1123 01/15/25 0830  INR 1.7* 1.8*   Thyroid  Function Tests: No results for input(s): TSH, T4TOTAL, FREET4, T3FREE, THYROIDAB in the last 72 hours. Lipid Profile: No results for input(s): CHOL, HDL, LDLCALC, TRIG, CHOLHDL, LDLDIRECT in the last 72 hours. Anemia Panel: No results for input(s): VITAMINB12, FOLATE, FERRITIN, TIBC, IRON, RETICCTPCT in the last 72 hours. Urine analysis:    Component Value Date/Time   COLORURINE YELLOW 01/12/2025 1407   APPEARANCEUR CLEAR 01/12/2025 1407   LABSPEC 1.029 01/12/2025 1407   PHURINE 6.0 01/12/2025 1407   GLUCOSEU NEGATIVE 01/12/2025 1407   HGBUR SMALL (A) 01/12/2025 1407   BILIRUBINUR NEGATIVE 01/12/2025 1407   KETONESUR NEGATIVE 01/12/2025 1407   PROTEINUR NEGATIVE 01/12/2025 1407   UROBILINOGEN 0.2 04/28/2011 1859   NITRITE NEGATIVE 01/12/2025 1407   LEUKOCYTESUR NEGATIVE 01/12/2025 1407   Sepsis Labs: Invalid input(s): PROCALCITONIN, LACTICIDVEN  Microbiology: Recent Results (from the past 240 hours)  MRSA Next Gen by PCR, Nasal     Status: None    Collection Time: 01/13/25  7:03 PM   Specimen: Nasal Mucosa; Nasal Swab  Result Value Ref Range Status   MRSA by PCR Next Gen NOT DETECTED NOT DETECTED Final    Comment: (NOTE) The GeneXpert MRSA Assay (FDA approved for NASAL specimens only), is one component of a comprehensive MRSA colonization surveillance program. It is not intended to diagnose MRSA infection nor to guide or monitor treatment for MRSA infections. Test performance is not FDA approved in patients less than 27 years old. Performed at [facility], [address] [place], [place] [address]   Aerobic/Anaerobic Culture w Gram Stain (surgical/deep wound)     Status: None (Preliminary result)   Collection Time: 01/15/25 12:59 PM   Specimen: Abdomen; Abscess  Result Value Ref Range Status   Specimen Description   Final    ABDOMEN Performed at [facility], [address] [place], [place] [address]    Special Requests   Final    NONE Performed at [facility], [address] [place], [place] [address]    Gram Stain   Final    ABUNDANT WBC PRESENT, PREDOMINANTLY PMN RARE GRAM NEGATIVE RODS Performed at [facility], [address], [place], [place] [address]    Culture FEW MULTIPLE ORGANISMS PRESENT, NONE PREDOMINANT  Final   Report Status PENDING  Incomplete    Radiology Studies: No results found.      Bethanny Toelle T. Janann Boeve [facility]  If 7PM-7AM, please contact night-coverage [email or web address] 01/17/2025, 12:06 PM   "

## 2025-01-17 NOTE — Progress Notes (Signed)
"  °  Progress Note Patient Name: [name] Date of Encounter: 01/17/2025 [facility] Cardiologist: Madonna Large, DO   Interval Summary   No events overnight. Reports some cough this morning and continues to have some dyspnea.  Vital Signs Vitals:   01/17/25 0900 01/17/25 1000 01/17/25 1100 01/17/25 1150  BP: (!) 135/54 119/71 (!) 126/55   Pulse: 98 95 90   Resp: (!) 23 (!) 23 (!) 26   Temp:    98.1 F (36.7 C)  TempSrc:    Oral  SpO2: 95% 98% 98%   Weight:      Height:        Intake/Output Summary (Last 24 hours) at 01/17/2025 1205 Last data filed at 01/17/2025 0930 Gross per 24 hour  Intake 947.48 ml  Output 975 ml  Net -27.52 ml      01/13/2025    7:36 PM 01/12/2025    9:08 PM 12/22/2024    1:36 PM  Last 3 Weights  Weight (lbs) 105 lb 2.6 oz 105 lb 9.6 oz 105 lb 8 oz  Weight (kg) 47.7 kg 47.9 kg 47.854 kg      Telemetry/ECG  NSR - Personally Reviewed  Physical Exam  GEN: No acute distress.   Neck: No JVD Cardiac: RRR, no murmurs, rubs, or gallops.  Respiratory: Clear to auscultation bilaterally. GI: Soft, nontender, non-distended  MS: No edema  Assessment & Plan  Ms Kocak is a 45 yoF with Hx of Afib and HFmrEF who presented for acute appendicitis and found to be in Afib with RVR. She was briefly on dilt gtt before being transitioned to amio gtt and PO metop. Eliquis  was held due to concern for melena, though Hgb has been stable and melena has resolved. Her metop was increased from 25 BID to TID on 1/23. TTE with 50-55% and mod pericardial effusion.   #Afib with RVR #HFimprovedEF 50-55% #Moderate pericardial effusion - Reporting some dyspnea and cough this AM. No evidence of volume overload. - Follow up with chest x-ray - Patient is now in NSR; will transition to PO amio load in AM - Cont metop 25 TID - Heparin  gtt today with plan for DOAC if Hgb stable - Repeat limited echo on Monday - We will cont to follow  For questions or updates,  please contact [facility] Please consult [email or web address] for contact info under   Signed, Joelle VEAR Ren Donley, MD  "

## 2025-01-17 NOTE — Evaluation (Signed)
 Occupational Therapy Evaluation Patient Details Name: [name] MRN: [id number] DOB: 1946-07-03 Today's Date: 01/17/2025   History of Present Illness   [name] is a 79 y.o. female who presented to the ED due to cramping RLQ abdominal pain with associated abdominal distension, black stools, nausea, vomiting that started yesterday. Found to have periappendiceal abscess with IR drain placed, not a candidate for sx due to co -morbidities. PHMx: RA, CMML on chemotherapy, afib,   R TSA needs L TSA, Chronic A-fib,COPD, celiac dz, CKD III, HTN, anemia, T10 paraspinal soft tissue mass, R pleural metastatic nodule     Clinical Impressions This 79 yo female admitted with above presents to acute OT with PLOF of being totally independent to Mod I with basic ADLs and mobility. Currently she is setup-mod A for basic ADLs with SOB playing a big role in this. She will continue to benefit from acute OT with follow up from intensive inpatient follow-up therapy, >3 hours/day. Pt has a very supportive husband.      If plan is discharge home, recommend the following:   Two people to help with walking and/or transfers;A lot of help with bathing/dressing/bathroom;Assistance with cooking/housework;Help with stairs or ramp for entrance;Assist for transportation     Functional Status Assessment   Patient has had a recent decline in their functional status and demonstrates the ability to make significant improvements in function in a reasonable and predictable amount of time.     Equipment Recommendations   None recommended by OT     Recommendations for Other Services   Rehab consult     Precautions/Restrictions   Precautions Precautions: Fall Precaution/Restrictions Comments: monitor O2 Restrictions Weight Bearing Restrictions Per Provider Order: No     Mobility Bed Mobility Overal bed mobility: Needs Assistance Bed Mobility: Supine to Sit     Supine to sit: Contact  guard, HOB elevated, Used rails     General bed mobility comments: increased time, SOB once EOB on RA (dropped as low as 89%). O2 reapplied at 2 liters before continuing on with ambulation    Transfers Overall transfer level: Needs assistance Equipment used: Rolling walker (2 wheels) Transfers: Sit to/from Stand Sit to Stand: Min assist, +2 safety/equipment                  Balance Overall balance assessment: Needs assistance Sitting-balance support: No upper extremity supported, Feet supported Sitting balance-Leahy Scale: Fair     Standing balance support: Bilateral upper extremity supported, Reliant on assistive device for balance Standing balance-Leahy Scale: Poor Standing balance comment: reliant on RW and additional external A from therapist                           ADL either performed or assessed with clinical judgement   ADL Overall ADL's : Needs assistance/impaired Eating/Feeding: Independent;Sitting   Grooming: Set up;Sitting   Upper Body Bathing: Set up;Sitting   Lower Body Bathing: Moderate assistance Lower Body Bathing Details (indicate cue type and reason): Min A sit<>stand Upper Body Dressing : Set up;Sitting   Lower Body Dressing: Moderate assistance Lower Body Dressing Details (indicate cue type and reason): Min A sit<>stand Toilet Transfer: Minimal assistance;+2 for safety/equipment;Rolling walker (2 wheels) Toilet Transfer Details (indicate cue type and reason): simulated bed>out and ambulating in hallway>back to recliner in room Toileting- Clothing Manipulation and Hygiene: Total assistance Toileting - Clothing Manipulation Details (indicate cue type and reason): min A sit<>stand  General ADL Comments: Educated on purse lipped breathing (pt reports she cannot breath through her nose)     Vision Patient Visual Report: No change from baseline              Pertinent Vitals/Pain Pain Assessment Pain Assessment: No/denies  pain     Extremity/Trunk Assessment Upper Extremity Assessment Upper Extremity Assessment: Right hand dominant;RUE deficits/detail;LUE deficits/detail RUE Deficits / Details: R TSA history RUE Coordination: decreased gross motor LUE Deficits / Details: needs L TSA per pt LUE Coordination: decreased gross motor           Communication Communication Communication: No apparent difficulties   Cognition Arousal: Alert Behavior During Therapy: WFL for tasks assessed/performed Cognition: No apparent impairments                               Following commands: Intact       Cueing   Cueing Techniques: Verbal cues              Home Living Family/patient expects to be discharged to:: Inpatient rehab Living Arrangements: Spouse/significant other Available Help at Discharge: Family;Available 24 hours/day Type of Home: House Home Access: Ramped entrance     Home Layout: Two level;Bed/bath upstairs Alternate Level Stairs-Number of Steps: stair lift   Bathroom Shower/Tub: Walk-in shower   Bathroom Toilet: Handicapped height     Home Equipment: Agricultural Consultant (2 wheels);Rollator (4 wheels);Shower seat   Additional Comments: pt has rollator downstairs and RW upstairs      Prior Functioning/Environment Prior Level of Function : Needs assist             Mobility Comments: rollator downstairs, RW upstairs ADLs Comments: mostly Mod I with ADLs    OT Problem List: Decreased strength;Decreased range of motion;Decreased activity tolerance;Impaired balance (sitting and/or standing);Cardiopulmonary status limiting activity   OT Treatment/Interventions: Self-care/ADL training;Patient/family education;DME and/or AE instruction;Balance training;Energy conservation      OT Goals(Current goals can be found in the care plan section)   Acute Rehab OT Goals Patient Stated Goal: to breath better OT Goal Formulation: With patient Time For Goal Achievement:  01/31/25 Potential to Achieve Goals: Good   OT Frequency:  Min 2X/week       AM-PAC OT 6 Clicks Daily Activity     Outcome Measure Help from another person eating meals?: None Help from another person taking care of personal grooming?: A Little Help from another person toileting, which includes using toliet, bedpan, or urinal?: A Lot Help from another person bathing (including washing, rinsing, drying)?: A Lot Help from another person to put on and taking off regular upper body clothing?: A Little Help from another person to put on and taking off regular lower body clothing?: A Lot 6 Click Score: 16   End of Session Equipment Utilized During Treatment: Rolling walker (2 wheels) Nurse Communication: Mobility status (+1 min A but need +2 due to lines)  Activity Tolerance: Patient limited by fatigue (reports arms feel weak) Patient left: in chair;with call bell/phone within reach;with family/visitor present  OT Visit Diagnosis: Unsteadiness on feet (R26.81);Other abnormalities of gait and mobility (R26.89);Muscle weakness (generalized) (M62.81)                Time: 8984-8952 OT Time Calculation (min): 32 min Charges:  OT Evaluation $OT Eval Moderate Complexity: 1 Mod  Sherry L. OT Acute Rehabilitation Services Office [phone]    [name] 01/17/2025, 12:04 PM

## 2025-01-18 ENCOUNTER — Inpatient Hospital Stay (HOSPITAL_COMMUNITY)

## 2025-01-18 DIAGNOSIS — I3139 Other pericardial effusion (noninflammatory): Secondary | ICD-10-CM

## 2025-01-18 DIAGNOSIS — K3580 Unspecified acute appendicitis: Secondary | ICD-10-CM | POA: Diagnosis not present

## 2025-01-18 DIAGNOSIS — I5023 Acute on chronic systolic (congestive) heart failure: Secondary | ICD-10-CM

## 2025-01-18 DIAGNOSIS — K3533 Acute appendicitis with perforation and localized peritonitis, with abscess: Secondary | ICD-10-CM | POA: Diagnosis not present

## 2025-01-18 DIAGNOSIS — I4891 Unspecified atrial fibrillation: Secondary | ICD-10-CM | POA: Diagnosis not present

## 2025-01-18 DIAGNOSIS — Z7189 Other specified counseling: Secondary | ICD-10-CM | POA: Diagnosis not present

## 2025-01-18 LAB — CBC WITH DIFFERENTIAL/PLATELET
Abs Immature Granulocytes: 17.91 10*3/uL — ABNORMAL HIGH (ref 0.00–0.07)
Basophils Absolute: 0.8 10*3/uL — ABNORMAL HIGH (ref 0.0–0.1)
Basophils Relative: 1 %
Eosinophils Absolute: 3 10*3/uL — ABNORMAL HIGH (ref 0.0–0.5)
Eosinophils Relative: 3 %
HCT: 30.5 % — ABNORMAL LOW (ref 36.0–46.0)
Hemoglobin: 9.4 g/dL — ABNORMAL LOW (ref 12.0–15.0)
Immature Granulocytes: 17 %
Lymphocytes Relative: 4 %
Lymphs Abs: 4.2 10*3/uL — ABNORMAL HIGH (ref 0.7–4.0)
MCH: 32.8 pg (ref 26.0–34.0)
MCHC: 30.8 g/dL (ref 30.0–36.0)
MCV: 106.3 fL — ABNORMAL HIGH (ref 80.0–100.0)
Monocytes Absolute: 32.3 10*3/uL — ABNORMAL HIGH (ref 0.1–1.0)
Monocytes Relative: 31 %
Neutro Abs: 47.5 10*3/uL — ABNORMAL HIGH (ref 1.7–7.7)
Neutrophils Relative %: 44 %
Platelets: 202 10*3/uL (ref 150–400)
RBC: 2.87 MIL/uL — ABNORMAL LOW (ref 3.87–5.11)
RDW: 17.8 % — ABNORMAL HIGH (ref 11.5–15.5)
WBC: 105.6 10*3/uL (ref 4.0–10.5)
nRBC: 0.1 % (ref 0.0–0.2)

## 2025-01-18 LAB — RENAL FUNCTION PANEL
Albumin: 3.1 g/dL — ABNORMAL LOW (ref 3.5–5.0)
Anion gap: 12 (ref 5–15)
BUN: 12 mg/dL (ref 8–23)
CO2: 17 mmol/L — ABNORMAL LOW (ref 22–32)
Calcium: 6.8 mg/dL — ABNORMAL LOW (ref 8.9–10.3)
Chloride: 105 mmol/L (ref 98–111)
Creatinine, Ser: 1.19 mg/dL — ABNORMAL HIGH (ref 0.44–1.00)
GFR, Estimated: 47 mL/min — ABNORMAL LOW
Glucose, Bld: 150 mg/dL — ABNORMAL HIGH (ref 70–99)
Phosphorus: 2 mg/dL — ABNORMAL LOW (ref 2.5–4.6)
Potassium: 4.4 mmol/L (ref 3.5–5.1)
Sodium: 134 mmol/L — ABNORMAL LOW (ref 135–145)

## 2025-01-18 LAB — ECHOCARDIOGRAM LIMITED
Calc EF: 57.1 %
Height: 61 in
Single Plane A2C EF: 57.9 %
Single Plane A4C EF: 59.5 %
Weight: 1682.55 [oz_av]

## 2025-01-18 LAB — APTT: aPTT: 66 s — ABNORMAL HIGH (ref 24–36)

## 2025-01-18 LAB — HEPARIN LEVEL (UNFRACTIONATED): Heparin Unfractionated: 0.14 [IU]/mL — ABNORMAL LOW (ref 0.30–0.70)

## 2025-01-18 LAB — MAGNESIUM: Magnesium: 1.9 mg/dL (ref 1.7–2.4)

## 2025-01-18 LAB — LACTIC ACID, PLASMA: Lactic Acid, Venous: 1.3 mmol/L (ref 0.5–1.9)

## 2025-01-18 MED ORDER — FUROSEMIDE 10 MG/ML IJ SOLN
40.0000 mg | Freq: Two times a day (BID) | INTRAMUSCULAR | Status: DC
  Administered 2025-01-18 – 2025-01-19 (×3): 40 mg via INTRAVENOUS
  Filled 2025-01-18 (×3): qty 4

## 2025-01-18 MED ORDER — METOPROLOL TARTRATE 50 MG PO TABS
75.0000 mg | ORAL_TABLET | Freq: Two times a day (BID) | ORAL | Status: DC
  Administered 2025-01-18 – 2025-01-20 (×5): 75 mg via ORAL
  Filled 2025-01-18 (×6): qty 1

## 2025-01-18 MED ORDER — METRONIDAZOLE 500 MG/100ML IV SOLN
500.0000 mg | Freq: Two times a day (BID) | INTRAVENOUS | Status: DC
  Administered 2025-01-19 – 2025-01-20 (×3): 500 mg via INTRAVENOUS
  Filled 2025-01-18 (×3): qty 100

## 2025-01-18 MED ORDER — HEPARIN (PORCINE) 25000 UT/250ML-% IV SOLN: 10.0000 [IU]/kg/h | INTRAVENOUS | Status: DC

## 2025-01-18 MED ORDER — HEPARIN (PORCINE) 25000 UT/250ML-% IV SOLN
850.0000 [IU]/h | INTRAVENOUS | Status: DC
  Administered 2025-01-18: 600 [IU]/h via INTRAVENOUS
  Administered 2025-01-19: 850 [IU]/h via INTRAVENOUS
  Filled 2025-01-18 (×2): qty 250

## 2025-01-18 NOTE — Progress Notes (Addendum)
 PHARAMACY - ANTICOAGULATION CONSULT NOTE  Pharmacy Consult for IV heparin  Indication: atrial fibrillation  Allergies[1]  Patient Measurements: Height: 5' 1 (154.9 cm) Weight: 47.7 kg (105 lb 2.6 oz) IBW/kg (Calculated) : 47.8 HEPARIN  DW (KG): 47.7  Vital Signs: Temp: 97.1 F (36.2 C) (01/25 0423) Temp Source: Axillary (01/25 0423) BP: 119/64 (01/25 0423) Pulse Rate: 73 (01/25 0423)  Labs: Recent Labs    01/15/25 0830 01/16/25 0815 01/17/25 0649 01/18/25 0448  HGB 9.1* 9.7* 8.6* 9.4*  HCT 29.9* 32.0* 26.8* 30.5*  PLT 150 172 174 202  LABPROT 22.2*  --   --   --   INR 1.8*  --   --   --   CREATININE 0.89 0.99 1.02*  --     Estimated Creatinine Clearance: 34.2 mL/min (A) (by C-G formula based on SCr of 1.02 mg/dL (H)).   Medical History: Past Medical History:  Diagnosis Date   Arthritis    Rheumatoid arthritis   Celiac disease    Chronic kidney disease    stage 3 from MD notes   COPD (chronic obstructive pulmonary disease) (HCC)    Dyspnea    with going up stairs   Family history of adverse reaction to anesthesia    father had hard time waking up   Headache    sinus headaches   Hot flashes    Hypertension    Iron deficiency anemia    Pneumonia    per patient I have walking pneumonia    Medications:  Medications Prior to Admission  Medication Sig Dispense Refill Last Dose/Taking   acetaminophen  (TYLENOL ) 500 MG tablet Take 2 tablets (1,000 mg total) by mouth 3 (three) times daily. (Patient taking differently: Take 1,000 mg by mouth daily at 12 noon.) 30 tablet 0 01/11/2025 Noon   apixaban  (ELIQUIS ) 5 MG TABS tablet TAKE 1 TABLET(5 MG) BY MOUTH TWICE DAILY 60 tablet 5 01/11/2025 at 12:00 PM   azaCITIDine  (VIDAZA ) 100 MG SUSR Inject 114 mg into the vein every 6 (six) weeks.   Past Month   CLARITIN  10 MG tablet Take 10 mg by mouth daily.   01/11/2025 Morning   denosumab  (PROLIA ) 60 MG/ML SOSY injection Inject 60 mg into the skin every 6 (six) months.    Unknown   diltiazem  (CARDIZEM  CD) 360 MG 24 hr capsule Take 1 capsule (360 mg total) by mouth daily. 90 capsule 0 01/11/2025 Morning   furosemide  (LASIX ) 20 MG tablet Take 20 mg by mouth in the morning.   01/11/2025 Morning   gabapentin  (NEURONTIN ) 300 MG capsule Take 300 mg by mouth in the morning.   01/11/2025 Noon   inFLIXimab  (REMICADE ) 100 MG injection Inject into the vein every 8 (eight) weeks.   Unknown   Metoprolol  Tartrate 75 MG TABS Take 2 tablets (150 mg total) by mouth 2 (two) times daily. 360 tablet 3 01/11/2025 Morning   ondansetron  (ZOFRAN ) 4 MG tablet Take 1 tablet (4 mg total) by mouth every 8 (eight) hours as needed for nausea or vomiting. 20 tablet 1 01/11/2025 Noon   predniSONE  (DELTASONE ) 5 MG tablet Take 5 mg by mouth daily with breakfast.   01/11/2025 Morning   Tiotropium Bromide-Olodaterol (STIOLTO RESPIMAT ) 2.5-2.5 MCG/ACT AERS Inhale 2 puffs into the lungs daily. 4 g 11 01/11/2025 Morning   furosemide  (LASIX ) 20 MG tablet Take 1 tablet (20 mg total) by mouth daily. (Patient not taking: Reported on 01/12/2025) 30 tablet 11 Not Taking   nitrofurantoin , macrocrystal-monohydrate, (MACROBID ) 100 MG capsule Take 1  capsule (100 mg total) by mouth 2 (two) times daily. (Patient not taking: Reported on 01/12/2025) 14 capsule 0 Not Taking   Scheduled:   acetaminophen   650 mg Oral Q6H WA   arformoterol   15 mcg Nebulization BID   And   umeclidinium bromide   1 puff Inhalation Daily   Chlorhexidine  Gluconate Cloth  6 each Topical Daily   furosemide   40 mg Intravenous BID   gabapentin   300 mg Oral Daily   lactose free nutrition  237 mL Oral BID BM   metoprolol  tartrate  75 mg Oral BID   pantoprazole  (PROTONIX ) IV  40 mg Intravenous BID   polyethylene glycol  17 g Oral BID   predniSONE   5 mg Oral Q breakfast   sodium chloride  flush  10-40 mL Intracatheter Q12H   sodium chloride  flush  5 mL Intracatheter Q8H   PRN: ipratropium-albuterol , lip balm, morphine  injection, ondansetron  **OR**  ondansetron  (ZOFRAN ) IV, mouth rinse, oxyCODONE   Assessment: 70 yoF with PMH Afib on PTA Eliquis , 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 Found to have acute appendicitis with periappendiceal abscess and possible choledocholithiasis. FOBT+. Also noted to be in Afib-RVR on admission. Had periappendiceal abscess drain placed 1/22; current plan is for non-surgical management d/t multiple comorbidities making patient extremely high-risk for surgery. On 1/25, Pharmacy consulted to resume heparin   Baseline INR slightly elevated (as expected w/ DOAC); aPTT WNL Prior anticoagulation: Eliquis  5 mg BID, last dose 1/18 at noon  Significant events:  Today, 01/18/2025: CBC: Hgb remains low but stable since admission; has not required any blood products thus far. Plt stable WNL SCr slightly elevated above baseline of ~0.9; CrCl low primarily d/t low weight No bleeding or infusion issues per nursing  Goal of Therapy: Heparin  level 0.3-0.7 units/ml; initially would aim for lower end of goal (0.3-0.5) given recent LGIB Monitor platelets by anticoagulation protocol: Yes  Plan: Start heparin  600 units/hr IV infusion; no bolus given recent GIB Check heparin  level (and aPTT in case some residual DOAC effects present) 8 hrs after start Daily CBC, daily heparin  level once stable Monitor for signs of bleeding or thrombosis  Bard Jeans, PharmD, BCPS [phone] 01/18/2025, 6:43 AM      [1]  Allergies Allergen Reactions   Digoxin  And Related Rash and Other (See Comments)    Generalized total body rash with immense itching   Gluten Meal Diarrhea   Penicillins Rash and Other (See Comments)    Did it involve sudden or severe rash/hives, skin peeling, or any reaction on the inside of your mouth or nose? Yes When did it last happen? Many Years Ago      Fexofenadine Other (See Comments)    Dry mouth   Alendronate Rash   Hydroxychloroquine Rash

## 2025-01-18 NOTE — Progress Notes (Signed)
 PHARAMACY - ANTICOAGULATION CONSULT NOTE  Pharmacy Consult for IV heparin  Indication: atrial fibrillation  Allergies[1]  Patient Measurements: Height: 5' 1 (154.9 cm) Weight: 47.7 kg (105 lb 2.6 oz) IBW/kg (Calculated) : 47.8 HEPARIN  DW (KG): 47.7  Vital Signs: Temp: 98 F (36.7 C) (01/25 1311) Temp Source: Oral (01/25 1311) BP: 173/90 (01/25 1311) Pulse Rate: 73 (01/25 1311)  Labs: Recent Labs    01/16/25 0815 01/17/25 0649 01/18/25 0448 01/18/25 1612  HGB 9.7* 8.6* 9.4*  --   HCT 32.0* 26.8* 30.5*  --   PLT 172 174 202  --   HEPARINUNFRC  --   --   --  0.14*  CREATININE 0.99 1.02* 1.19*  --     Estimated Creatinine Clearance: 29.3 mL/min (A) (by C-G formula based on SCr of 1.19 mg/dL (H)).   Medical History: Past Medical History:  Diagnosis Date   Arthritis    Rheumatoid arthritis   Celiac disease    Chronic kidney disease    stage 3 from MD notes   COPD (chronic obstructive pulmonary disease) (HCC)    Dyspnea    with going up stairs   Family history of adverse reaction to anesthesia    father had hard time waking up   Headache    sinus headaches   Hot flashes    Hypertension    Iron deficiency anemia    Pneumonia    per patient I have walking pneumonia    Medications:  Medications Prior to Admission  Medication Sig Dispense Refill Last Dose/Taking   acetaminophen  (TYLENOL ) 500 MG tablet Take 2 tablets (1,000 mg total) by mouth 3 (three) times daily. (Patient taking differently: Take 1,000 mg by mouth daily at 12 noon.) 30 tablet 0 01/11/2025 Noon   apixaban  (ELIQUIS ) 5 MG TABS tablet TAKE 1 TABLET(5 MG) BY MOUTH TWICE DAILY 60 tablet 5 01/11/2025 at 12:00 PM   azaCITIDine  (VIDAZA ) 100 MG SUSR Inject 114 mg into the vein every 6 (six) weeks.   Past Month   CLARITIN  10 MG tablet Take 10 mg by mouth daily.   01/11/2025 Morning   denosumab  (PROLIA ) 60 MG/ML SOSY injection Inject 60 mg into the skin every 6 (six) months.   Unknown   diltiazem  (CARDIZEM   CD) 360 MG 24 hr capsule Take 1 capsule (360 mg total) by mouth daily. 90 capsule 0 01/11/2025 Morning   furosemide  (LASIX ) 20 MG tablet Take 20 mg by mouth in the morning.   01/11/2025 Morning   gabapentin  (NEURONTIN ) 300 MG capsule Take 300 mg by mouth in the morning.   01/11/2025 Noon   inFLIXimab  (REMICADE ) 100 MG injection Inject into the vein every 8 (eight) weeks.   Unknown   Metoprolol  Tartrate 75 MG TABS Take 2 tablets (150 mg total) by mouth 2 (two) times daily. 360 tablet 3 01/11/2025 Morning   ondansetron  (ZOFRAN ) 4 MG tablet Take 1 tablet (4 mg total) by mouth every 8 (eight) hours as needed for nausea or vomiting. 20 tablet 1 01/11/2025 Noon   predniSONE  (DELTASONE ) 5 MG tablet Take 5 mg by mouth daily with breakfast.   01/11/2025 Morning   Tiotropium Bromide-Olodaterol (STIOLTO RESPIMAT ) 2.5-2.5 MCG/ACT AERS Inhale 2 puffs into the lungs daily. 4 g 11 01/11/2025 Morning   furosemide  (LASIX ) 20 MG tablet Take 1 tablet (20 mg total) by mouth daily. (Patient not taking: Reported on 01/12/2025) 30 tablet 11 Not Taking   nitrofurantoin , macrocrystal-monohydrate, (MACROBID ) 100 MG capsule Take 1 capsule (100 mg total) by mouth  2 (two) times daily. (Patient not taking: Reported on 01/12/2025) 14 capsule 0 Not Taking   Scheduled:   acetaminophen   650 mg Oral Q6H WA   arformoterol   15 mcg Nebulization BID   And   umeclidinium bromide   1 puff Inhalation Daily   Chlorhexidine  Gluconate Cloth  6 each Topical Daily   furosemide   40 mg Intravenous BID   gabapentin   300 mg Oral Daily   lactose free nutrition  237 mL Oral BID BM   metoprolol  tartrate  75 mg Oral BID   pantoprazole  (PROTONIX ) IV  40 mg Intravenous BID   polyethylene glycol  17 g Oral BID   predniSONE   5 mg Oral Q breakfast   sodium chloride  flush  10-40 mL Intracatheter Q12H   sodium chloride  flush  5 mL Intracatheter Q8H   PRN: ipratropium-albuterol , lip balm, morphine  injection, ondansetron  **OR** ondansetron  (ZOFRAN ) IV, mouth  rinse, oxyCODONE   Assessment: 62 yoF with PMH Afib on PTA Eliquis , 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 Found to have acute appendicitis with periappendiceal abscess and possible choledocholithiasis. FOBT+. Also noted to be in Afib-RVR on admission. Had periappendiceal abscess drain placed 1/22; current plan is for non-surgical management d/t multiple comorbidities making patient extremely high-risk for surgery. On 1/25, Pharmacy consulted to resume heparin   Baseline INR slightly elevated (as expected w/ DOAC); aPTT WNL Prior anticoagulation: Eliquis  5 mg BID, last dose 1/18 at noon  Significant events:  Today, 01/18/2025: CBC: Hgb remains low but stable since admission; has not required any blood products thus far. Plt stable WNL SCr slightly elevated above baseline of ~0.9; CrCl low primarily d/t low weight No bleeding or infusion issues per nursing HL is 0.14, subtherapeutic  aPTT 66 sec   Goal of Therapy: Heparin  level 0.3-0.7 units/ml; initially would aim for lower end of goal (0.3-0.5) given recent LGIB Monitor platelets by anticoagulation protocol: Yes  Plan: Increase heparin  to 750 units/hr IV infusion; no bolus given recent GIB Check heparin  level   8 hrs after rate change  Daily CBC, daily heparin  level once stable Monitor for signs of bleeding or thrombosis   Dolphus Roller, PharmD, BCPS 01/18/2025 4:58 PM        [1]  Allergies Allergen Reactions   Digoxin  And Related Rash and Other (See Comments)    Generalized total body rash with immense itching   Gluten Meal Diarrhea   Penicillins Rash and Other (See Comments)    Did it involve sudden or severe rash/hives, skin peeling, or any reaction on the inside of your mouth or nose? Yes When did it last happen? Many Years Ago      Fexofenadine Other (See Comments)    Dry mouth   Alendronate Rash   Hydroxychloroquine Rash

## 2025-01-18 NOTE — Progress Notes (Signed)
 "  Progress Note     Subjective:  No abdominal pain, not much appetite.  Still has some shortness of breath when she tries to move.  She feels cold and had some sweats overnight  ROS  All negative with the exception of above.  Objective: Vital signs in last 24 hours: Temp:  [97.1 F (36.2 C)-98.7 F (37.1 C)] 97.1 F (36.2 C) (01/25 0423) Pulse Rate:  [73-98] 73 (01/25 0423) Resp:  [16-26] 20 (01/25 0423) BP: (101-135)/(45-80) 119/64 (01/25 0423) SpO2:  [95 %-99 %] 99 % (01/25 0423) FiO2 (%):  [21 %] 21 % (01/24 1706) Last BM Date : 01/17/25  Intake/Output from previous day: 01/24 0701 - 01/25 0700 In: 1493.4 [P.O.:200; I.V.:803.9; IV Piggyback:489.6] Out: 1530 [Urine:1500; Drains:30] Intake/Output this shift: Total I/O In: 1488.4 [P.O.:200; I.V.:798.9; IV Piggyback:489.6] Out: 15 [Drains:15]  PE: General: Pleasant chronically ill appearing female who is laying in bed in NAD. Chest: Tachypneic and dyspneic, but saturating 100% Abd: Soft, nondistended.  Minimal tenderness to palpation of RLQ.  No peritoneal signs.  Drain output is slightly murky serosanguineous Skin: Warm and dry.  Psych: A&Ox3 with an appropriate affect.    Lab Results:  Recent Labs    01/17/25 0649 01/18/25 0448  WBC 80.1* 105.6*  HGB 8.6* 9.4*  HCT 26.8* 30.5*  PLT 174 202   BMET Recent Labs    01/16/25 0815 01/17/25 0649  NA 135 138  K 3.8 3.3*  CL 106 107  CO2 19* 19*  GLUCOSE 92 107*  BUN 11 11  CREATININE 0.99 1.02*  CALCIUM  6.6* 7.0*   PT/INR Recent Labs    01/15/25 0830  LABPROT 22.2*  INR 1.8*   CMP     Component Value Date/Time   NA 138 01/17/2025 0649   NA 141 01/04/2016 1001   K 3.3 (L) 01/17/2025 0649   K 4.5 01/04/2016 1001   CL 107 01/17/2025 0649   CO2 19 (L) 01/17/2025 0649   CO2 24 01/04/2016 1001   GLUCOSE 107 (H) 01/17/2025 0649   GLUCOSE 105 01/04/2016 1001   BUN 11 01/17/2025 0649   BUN 21.7 01/04/2016 1001   CREATININE 1.02 (H) 01/17/2025  0649   CREATININE 1.48 (H) 12/15/2024 1057   CREATININE 1.2 (H) 01/04/2016 1001   CALCIUM  7.0 (L) 01/17/2025 0649   CALCIUM  9.7 01/04/2016 1001   PROT 5.2 (L) 01/15/2025 0830   PROT 7.0 01/04/2016 1001   ALBUMIN  3.1 (L) 01/17/2025 0649   ALBUMIN  4.0 01/04/2016 1001   AST 19 01/15/2025 0830   AST 21 12/21/2023 1359   AST 26 01/04/2016 1001   ALT 9 01/15/2025 0830   ALT 14 12/21/2023 1359   ALT 24 01/04/2016 1001   ALKPHOS 82 01/15/2025 0830   ALKPHOS 61 01/04/2016 1001   BILITOT 0.4 01/15/2025 0830   BILITOT 0.8 12/21/2023 1359   BILITOT 0.41 01/04/2016 1001   GFRNONAA 56 (L) 01/17/2025 0649   GFRNONAA 36 (L) 12/15/2024 1057   GFRAA 56 (L) 01/09/2020 1339   Lipase     Component Value Date/Time   LIPASE 26 01/12/2025 1123       Studies/Results: DG Chest Port 1 View Result Date: 01/17/2025 EXAM: 1 VIEW(S) XRAY OF THE CHEST 01/17/2025 08:26:32 AM COMPARISON: 11/12/2023 CLINICAL HISTORY: Shortness of breath. FINDINGS: LINES, TUBES AND DEVICES: Right chest port with catheter tip at proximal right atrium. LUNGS AND PLEURA: New small bilateral pleural effusions, left greater than right. New bilateral interstitial opacities, and airspace opacities in  the lung bases left worse than right. No pneumothorax. HEART AND MEDIASTINUM: Mild cardiomegaly. Aortic arch atherosclerosis. No acute abnormality of the cardiac and mediastinal silhouettes. BONES AND SOFT TISSUES: Stable right shoulder arthroplasty. No acute osseous abnormality. IMPRESSION: 1. New bilateral interstitial and airspace opacities in the lung bases, left worse than right. 2. New small bilateral pleural effusions, left greater than right. 3. Mild cardiomegaly. Electronically signed by: Dayne Hassell MD 01/17/2025 03:24 PM EST RP Workstation: [id number]    Anti-infectives: Anti-infectives (From admission, onward)    Start     Dose/Rate Route Frequency Ordered Stop   01/14/25 0600  ceFEPIme  (MAXIPIME ) 2 g in sodium chloride  0.9  % 100 mL IVPB        2 g 200 mL/hr over 30 Minutes Intravenous Every 12 hours 01/13/25 1629     01/12/25 1830  metroNIDAZOLE  (FLAGYL ) IVPB 500 mg        500 mg 100 mL/hr over 60 Minutes Intravenous Every 12 hours 01/12/25 1822     01/12/25 1830  ceFEPIme  (MAXIPIME ) 2 g in sodium chloride  0.9 % 100 mL IVPB  Status:  Discontinued        2 g 200 mL/hr over 30 Minutes Intravenous Every 8 hours 01/12/25 1825 01/13/25 1629   01/12/25 1445  ceFEPIme  (MAXIPIME ) 2 g in sodium chloride  0.9 % 100 mL IVPB       Placed in And Linked Group   2 g 200 mL/hr over 30 Minutes Intravenous  Once 01/12/25 1436 01/12/25 1533   01/12/25 1445  metroNIDAZOLE  (FLAGYL ) IVPB 500 mg       Placed in And Linked Group   500 mg 100 mL/hr over 60 Minutes Intravenous  Once 01/12/25 1436 01/12/25 1606        Assessment/Plan Periappendiceal abscess -Reviewed with IR 1/19, plan at that time for repeat CT in a few days- CT 1/22 with minimal change and subsequently IR drain placed successfully -Afebrile, WBC remains markedly elevated and is increased this morning to 105.6 from 80.1 yesterday -Continue with IV abx  -Given her frailty and multiple medical problems including recent chemo, daily steroid use, Eliquis , active A-fib with RVR and hypotension etc, we pan to continue to treat with conservative management at this time and avoid surgical intervention if able. She is extremely high risk.  - New shortness of breath: Per primary team, planning thoracentesis for pleural effusions.  Suspect her white count is partially coming from her lungs at this point   FEN -gluten-free diet/1500 mL fluid restriction; IVFs per primary team VTE - Hold Eliquis , hold addition of heparin  gtt due to melena ID - Cefipime/Flagyl    UGI Bleed/Melena/anemia - unclear etiology.  No history of ulcer disease or NSAID use.  Her hgb appears stable at this time and she is HDS stable considering her Afib with RVR.  Dr. Kristie  evaluating. Choledocholithiasis - seems to be asymptomatic.  LFTs are normal indicating likely this to be nonocclusive.  Monitor LFTs.  At some point, will likely need this addressed, but as of now this is not the most acute problem. Dr. Kristie evaluating. 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 WBC baseline prior to admission ranging from 25-47 CHF/ cardiomyopathy A fib, RVR - cardiology consulted, echo pending. Anticoagulation on hold for GI bleed COPD Lung nodules HTN RA - on Remicade  and Prednisone  (5mg  daily) Extramedullary hematopoiesis with large presacral tumor CKD PVD Physical deconditioning Malnutrition Celiac disease Alcohol  use RLE pressure skin injury  LOS: 6 days   I reviewed specialist notes, hospitalist notes, consulting provider notes, nursing notes, last 24 h vitals and pain scores, last 48 h intake and output, last 24 h labs and trends, and last 24 h imaging results.  This care required high level of medical decision making.   Mitzie DELENA Freund, MD  [facility] 01/18/2025, 6:42 AM Please see Amion for pager number during day hours 7:00am-4:30pm  "

## 2025-01-18 NOTE — Progress Notes (Signed)
 " PROGRESS NOTE  [name] FMW:[id number] DOB: [birth year]   PCP: [name], MD  Patient is from: Home.  Lives with husband.  Uses rolling walker for mobility.  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    In ED, in A-fib with RVR with HR from 120s to 140s.  WBC 84.9 (baseline 40-50).  Hgb 10 (baseline 9-10).  Fecal Hemoccult positive.  CT abdomen and pelvis showed acute appendicitis with periappendiceal abscess, cholelithiasis with linear calcification of the distal CBD suspicious for choledocholithiasis.  General surgery consulted.  Patient was started on IV cefepime  and Flagyl , IV fluids and admitted for further care.  The next day, started on IV Cardizem .  Remained in RVR despite IV Cardizem .  Transitioned to IV amiodarone .  Cardiology following.  Repeat CT abdomen and pelvis on 1/21 without significant change.  Surgery consulted IR for possible periappendiceal abscess drainage, also recommended GI consult for anemia and possible choledocholithiasis.  She had drain placed by IR on 1/22.  Abscess culture with rare GNR.  Remains on IV cefepime  and Flagyl .  GI consulted for anemia and possible choledocholithiasis but no plan for any procedure due to risk.   Subjective: Seen and examined earlier this morning.  No major events overnight or this morning.  Reports feeling winded with minimal activity.  Also reports abdominal cramps.  She says she has 3 bowel movements since midnight.  proBNP was elevated to 8000 yesterday.  Portable CXR showed  new bilateral interstitial and airspace opacities in lung bases and small bilateral pleural effusions and mild cardiomegaly.  Seems like she was started on IV Lasix  this morning  Assessment and plan: Acute appendicitis with periappendiceal abscess-presents with RLQ pain, nausea and vomiting.  Markedly elevated leukocytosis above baseline.  Repeat CT without significant change. -Deemed to be high risk for surgery with underlying comorbidity. -S/p drain placement for periappendiceal abscess on 1/22.  Gram stain GNR.  Culture multiple species.   -Continue IV cefepime  and Flagyl  1/19>> -General Surgery and IR following.   Persistent A-fib with RVR: Remained in RVR on Cardizem  drip with soft blood pressure and transition to amiodarone  drip.  TTE with LVEF of 50 to 55%, moderate pericardial effusion, large pleural effusion in the left lateral region.  Currently in sinus rhythm -Amiodarone  drip off.  Started on metoprolol  75 mg twice daily -Eliquis  on hold.  On heparin  for anticoagulation. -Optimize electrolytes.  Normocytic anemia: Patient reports melanotic stool.  Heme occult positive but H&H relatively at baseline.  Hgb stable after initial drop.  No overt bleeding.  LBM on 1/22. Recent Labs    11/03/24 1022 11/25/24 0938 12/15/24 1057 01/12/25 1123 01/13/25 0615 01/14/25 0304 01/15/25 0830 01/16/25 0815 01/17/25 0649 01/18/25 0448  HGB 10.3* 10.4* 9.5* 10.4* 9.1* 8.6* 9.1* 9.7* 8.6* 9.4*  -Continue IV Protonix . - Appreciate input by GI-no plan for procedure due to risk.  Acute on chronic HFmrEF: TTE  LVEF of 50 to 55% (45 to 50% in 2023), moderate pericardial effusion, large pleural effusion in the left lateral region. Overall, appears euvolemic except  for edema.  Seems to be on p.o. Lasix  20 mg daily at home.  Some dyspnea and cough here.  Portable CXR concerning for CHF.  proBNP elevated to 8000. -Started on IV Lasix  40 mg twice daily by cardiology -Intake and output, daily weight,  renal functions and electrolytes   Moderate pericardial effusion: Noted on echocardiogram.   - Defer to cardiology  Small bilateral pleural effusion: Echocardiogram and CT abdomen and pelvis suggested moderate but portable CXR with small bilateral pleural effusion. Patient with history of CHF and anemia and malignancy.  Prior CT in 08/2024 also showed left axillary adenopathy and stable right greater than left paraspinous and pleural-based masses.  Now with shortness of breath and cough. - Started on IV Lasix  by cardiology.  Cholelithiasis with possible choledocholithiasis: Noted on initial and repeat CT.  No RUQ tenderness. LFT within normal.  I wonder if this is some calcification versus true choledocholithiasis. - GI on board-no plan for procedure.  Physical deconditioning: Patient reports ambulating with walker at baseline. - OOB, PT/OT  Chronic COPD: Stable. 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   CMML with elevated WBC:  - Appreciate input by oncology.   Alcohol  use: No withdrawal symptoms. - Continue monitoring  Back pain: Likely from lying in bed continuously -Scheduled Tylenol  every 6 hours while awake -Oxycodone  as needed -Decrease morphine  to 2 mg every 4 hours as needed severe pain. - OOB, PT/OT  Hypokalemia/hypophosphatemia/hypocalcemia - Monitor replenish as appropriate  Thrombocytopenia: Mild - Continue monitoring  Advance care planning: Full code with full scope of care.   See IPAL note on 1/23.  - Palliative on board.  Moderate malnutrition Body mass index is 19.87 kg/m. 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 Wound 01/13/25 1900 Pressure Injury Buttocks Left;Lower Stage 1 -  Intact  skin with non-blanchable redness of a localized area usually over a bony prominence. (Active)   Pressure skin injury: She has dark blister on right leg. -Consult wound care DVT prophylaxis:  SCDs Start: 01/12/25 1820  Code Status: Full code Family Communication: Updated patient's husband at bedside Level of care: Progressive Status is: Inpatient Remains inpatient appropriate because: Acute appendicitis with periappendiceal abscess, A-fib, acute CHF,   Final disposition: CIR   55 minutes with more than 50% spent in reviewing records, counseling patient/family and coordinating care.  Consultants:  General Surgery Cardiology Oncology/hematology Gastroenterology Palliative medicine  Procedures: None  Microbiology summarized: None  Objective: Vitals:   01/17/25 2111 01/17/25 2113 01/18/25 0114 01/18/25 0423  BP: 105/62 113/61 109/66 119/64  Pulse:   73 73  Resp:   18 20  Temp:   98.4 F (36.9 C) (!) 97.1 F (36.2 C)  TempSrc:   Oral Axillary  SpO2:   95% 99%  Weight:      Height:        Examination:  GENERAL: No apparent distress.  Nontoxic. HEENT: MMM.  Vision and hearing grossly intact.  NECK: Supple.  No apparent JVD.  RESP:  No IWOB.  Somewhat diminished aeration over lung bases. CVS: RRR at 80.  Heart sounds normal.  ABD/GI/GU: BS+.  Mild RLQ tenderness.  Mild tenderness across lower abdomen.  Drain in place. MSK/EXT:  Moves extremities. No apparent deformity.  Trace BLE edema.  SKIN: As above. NEURO: AA.  Oriented appropriately.  No apparent focal neuro deficit. PSYCH: Calm. Normal affect.   Sch Meds:  Scheduled Meds:  acetaminophen   650 mg Oral Q6H WA   arformoterol   15 mcg Nebulization BID   And   umeclidinium bromide   1 puff Inhalation Daily   Chlorhexidine  Gluconate Cloth  6 each Topical Daily   furosemide   40 mg Intravenous BID   gabapentin   300 mg Oral Daily   lactose free nutrition  237 mL Oral BID BM   metoprolol  tartrate  75 mg Oral BID    pantoprazole  (PROTONIX ) IV  40 mg Intravenous BID   polyethylene glycol  17 g Oral BID   predniSONE   5 mg Oral Q breakfast   sodium chloride  flush  10-40 mL Intracatheter Q12H   sodium chloride  flush  5 mL Intracatheter Q8H   Continuous Infusions:  ceFEPime  (MAXIPIME ) IV 2 g (01/18/25 0542)   heparin  600 Units/hr (01/18/25 0749)   metronidazole  500 mg (01/18/25 0208)   PRN Meds:.ipratropium-albuterol , lip balm, morphine  injection, ondansetron  **OR** ondansetron  (ZOFRAN ) IV, mouth rinse, oxyCODONE   Antimicrobials: Anti-infectives (From admission, onward)    Start     Dose/Rate Route Frequency Ordered Stop   01/14/25 0600  ceFEPIme  (MAXIPIME ) 2 g in sodium chloride  0.9 % 100 mL IVPB        2 g 200 mL/hr over 30 Minutes Intravenous Every 12 hours 01/13/25 1629     01/12/25 1830  metroNIDAZOLE  (FLAGYL ) IVPB 500 mg        500 mg 100 mL/hr over 60 Minutes Intravenous Every 12 hours 01/12/25 1822     01/12/25 1830  ceFEPIme  (MAXIPIME ) 2 g in sodium chloride  0.9 % 100 mL IVPB  Status:  Discontinued        2 g 200 mL/hr over 30 Minutes Intravenous Every 8 hours 01/12/25 1825 01/13/25 1629   01/12/25 1445  ceFEPIme  (MAXIPIME ) 2 g in sodium chloride  0.9 % 100 mL IVPB       Placed in And Linked Group   2 g 200 mL/hr over 30 Minutes Intravenous  Once 01/12/25 1436 01/12/25 1533   01/12/25 1445  metroNIDAZOLE  (FLAGYL ) IVPB 500 mg       Placed in And Linked Group   500 mg 100 mL/hr over 60 Minutes Intravenous  Once 01/12/25 1436 01/12/25 1606        I have personally reviewed the following labs and images: CBC: Recent Labs  Lab 01/14/25 0304 01/15/25 0830 01/16/25 0815 01/17/25 0649 01/18/25 0448  WBC 70.3* 77.7* 87.6* 80.1* 105.6*  NEUTROABS 44.3*  --   --   --  47.5*  HGB 8.6* 9.1* 9.7* 8.6* 9.4*  HCT 26.8* 29.9* 32.0* 26.8* 30.5*  MCV 105.5* 109.5* 109.2* 104.7* 106.3*  PLT 149* 150 172 174 202   BMP &GFR Recent Labs  Lab 01/14/25 0304 01/15/25 0830  01/16/25 0815 01/17/25 0649 01/18/25 0448  NA 136 136 135 138 134*  K 3.8 3.8 3.8 3.3* 4.4  CL 105 107 106 107 105  CO2 24 20* 19* 19* 17*  GLUCOSE 100* 96 92 107* 150*  BUN 17 12 11 11 12   CREATININE 0.98 0.89 0.99 1.02* 1.19*  CALCIUM  6.8* 6.6* 6.6* 7.0* 6.8*  MG 2.2 2.2 2.1 2.1 1.9  PHOS 2.5  --  1.4* 3.2 2.0*   Estimated Creatinine Clearance: 29.3 mL/min (A) (by C-G formula based on SCr of 1.19 mg/dL (H)). Liver & Pancreas: Recent Labs  Lab 01/12/25 1123 01/13/25 0615 01/14/25 0304 01/15/25  0830 01/16/25 0815 01/17/25 0649 01/18/25 0448  AST 26 22 19 19   --   --   --   ALT 12 10 10 9   --   --   --   ALKPHOS 105 80 86 82  --   --   --   BILITOT 0.6 0.4 0.4 0.4  --   --   --   PROT 6.3* 4.9* 5.0* 5.2*  --   --   --   ALBUMIN  3.5 2.8* 2.9* 2.8* 2.8* 3.1* 3.1*   Recent Labs  Lab 01/12/25 1123  LIPASE 26   No results for input(s): AMMONIA in the last 168 hours. Diabetic: No results for input(s): HGBA1C in the last 72 hours. No results for input(s): GLUCAP in the last 168 hours. Cardiac Enzymes: No results for input(s): CKTOTAL, CKMB, CKMBINDEX, TROPONINI in the last 168 hours. Recent Labs    01/17/25 1310  PROBNP 8,319.0*   Coagulation Profile: Recent Labs  Lab 01/12/25 1123 01/15/25 0830  INR 1.7* 1.8*   Thyroid  Function Tests: No results for input(s): TSH, T4TOTAL, FREET4, T3FREE, THYROIDAB in the last 72 hours. Lipid Profile: No results for input(s): CHOL, HDL, LDLCALC, TRIG, CHOLHDL, LDLDIRECT in the last 72 hours. Anemia Panel: No results for input(s): VITAMINB12, FOLATE, FERRITIN, TIBC, IRON, RETICCTPCT in the last 72 hours. Urine analysis:    Component Value Date/Time   COLORURINE YELLOW 01/12/2025 1407   APPEARANCEUR CLEAR 01/12/2025 1407   LABSPEC 1.029 01/12/2025 1407   PHURINE 6.0 01/12/2025 1407   GLUCOSEU NEGATIVE 01/12/2025 1407   HGBUR SMALL (A) 01/12/2025 1407   BILIRUBINUR NEGATIVE  01/12/2025 1407   KETONESUR NEGATIVE 01/12/2025 1407   PROTEINUR NEGATIVE 01/12/2025 1407   UROBILINOGEN 0.2 04/28/2011 1859   NITRITE NEGATIVE 01/12/2025 1407   LEUKOCYTESUR NEGATIVE 01/12/2025 1407   Sepsis Labs: Invalid input(s): PROCALCITONIN, LACTICIDVEN  Microbiology: Recent Results (from the past 240 hours)  MRSA Next Gen by PCR, Nasal     Status: None   Collection Time: 01/13/25  7:03 PM   Specimen: Nasal Mucosa; Nasal Swab  Result Value Ref Range Status   MRSA by PCR Next Gen NOT DETECTED NOT DETECTED Final    Comment: (NOTE) The GeneXpert MRSA Assay (FDA approved for NASAL specimens only), is one component of a comprehensive MRSA colonization surveillance program. It is not intended to diagnose MRSA infection nor to guide or monitor treatment for MRSA infections. Test performance is not FDA approved in patients less than 43 years old. Performed at [facility], [address], [place], [place] [address]   Aerobic/Anaerobic Culture w Gram Stain (surgical/deep wound)     Status: Abnormal (Preliminary result)   Collection Time: 01/15/25 12:59 PM   Specimen: Abdomen; Abscess  Result Value Ref Range Status   Specimen Description   Final    ABDOMEN Performed at [facility], [address] [place], [place] [address]    Special Requests   Final    NONE Performed at [facility], [address] [place], [place] [address]    Gram Stain   Final    ABUNDANT WBC PRESENT, PREDOMINANTLY PMN RARE GRAM NEGATIVE RODS Performed at [facility], [address] [place], [place] [address]    Culture (A)  Final    MULTIPLE ORGANISMS PRESENT, NONE PREDOMINANT NO ANAEROBES ISOLATED; CULTURE IN PROGRESS FOR 5 DAYS    Report Status PENDING  Incomplete    Radiology Studies: ECHOCARDIOGRAM LIMITED Result Date:  01/18/2025    ECHOCARDIOGRAM LIMITED REPORT   Patient Name:   SHARLOT STURKEY [facility] Date of Exam: 01/18/2025  Medical Rec #:  [id number]        Height:       61.0 in Accession #:    [id number]       Weight:       105.2 lb Date of Birth:  [birth year]       BSA:          1.437 m Patient Age:    78 years         BP:           119/64 mmHg Patient Gender: F                HR:           77 bpm. Exam Location:  Inpatient Procedure: Cardiac Doppler, Limited Echo and Limited Color Doppler (Both            Spectral and Color Flow Doppler were utilized during procedure). Indications:    Pericardial effusion I31.3  History:        Patient has prior history of Echocardiogram examinations, most                 recent 01/15/2025. Risk Factors:Hypertension.  Sonographer:    Sydnee [name] RDCS Referring Phys: [id number] FRANCK H AZOBOU TONLEU IMPRESSIONS  1. Left ventricular ejection fraction, by estimation, is 55 to 60%. Left ventricular ejection fraction by 2D MOD biplane is 57.1 %. The left ventricle has normal function.  2. A small pericardial effusion is present. The pericardial effusion is posterior to the left ventricle. Large pleural effusion in the left lateral region.  3. Aortic valve sclerosis/calcification is present, without any evidence of aortic stenosis. Comparison(s): Prior images reviewed side by side. There remains a large pleural effusion but pericardial effusion is now small (best seen in subcostal view). FINDINGS  Left Ventricle: Left ventricular ejection fraction, by estimation, is 55 to 60%. Left ventricular ejection fraction by 2D MOD biplane is 57.1 %. The left ventricle has normal function. Pericardium: A small pericardial effusion is present. The pericardial effusion is posterior to the left ventricle. Aortic Valve: Aortic valve sclerosis/calcification is present, without any evidence of aortic stenosis. Additional Comments: There is a large pleural effusion in the left lateral region.   LV Volumes (MOD)               Biplane EF (MOD) LV vol d, MOD    57.2 ml       LV Biplane EF:   Left A2C:                                             ventricular LV vol d, MOD    58.2 ml                        ejection A4C:                                            fraction by LV vol s, MOD    24.1 ml  2D MOD A2C:                                            biplane is LV vol s, MOD    23.6 ml                        57.1 %. A4C: LV SV MOD A2C:   33.1 ml LV SV MOD A4C:   58.2 ml LV SV MOD BP:    33.0 ml Franck Azobou Tonleu Electronically signed by Joelle Cedars Tonleu Signature Date/Time: 01/18/2025/11:12:50 AM    Final         Kacia Halley T. Jaree Dwight [facility]  If 7PM-7AM, please contact night-coverage [email or web address] 01/18/2025, 12:28 PM   "

## 2025-01-18 NOTE — Progress Notes (Signed)
"  °  Progress Note  Patient Name: [name] Date of Encounter: 01/18/2025 [facility] Cardiologist: Madonna Large, DO   Interval Summary   She has continued to have some cough, and reports some dyspnea  Vital Signs Vitals:   01/17/25 2111 01/17/25 2113 01/18/25 0114 01/18/25 0423  BP: 105/62 113/61 109/66 119/64  Pulse:   73 73  Resp:   18 20  Temp:   98.4 F (36.9 C) (!) 97.1 F (36.2 C)  TempSrc:   Oral Axillary  SpO2:   95% 99%  Weight:      Height:        Intake/Output Summary (Last 24 hours) at 01/18/2025 0622 Last data filed at 01/18/2025 9379 Gross per 24 hour  Intake 1498.43 ml  Output 1530 ml  Net -31.57 ml      01/13/2025    7:36 PM 01/12/2025    9:08 PM 12/22/2024    1:36 PM  Last 3 Weights  Weight (lbs) 105 lb 2.6 oz 105 lb 9.6 oz 105 lb 8 oz  Weight (kg) 47.7 kg 47.9 kg 47.854 kg      Telemetry/ECG  NSR - Personally Reviewed  Physical Exam  GEN: No acute distress.   Neck: No JVD Cardiac: RRR, no murmurs, rubs, or gallops.  Respiratory: Clear to auscultation bilaterally. GI: Soft, nontender, non-distended  Sherry: No edema  Assessment & Plan  [name] is a 85 yoF with Hx of Afib and HFmrEF who presented for acute appendicitis and found to be in Afib with RVR. She was briefly on dilt gtt before being transitioned to amio gtt and PO metop. Eliquis  was held due to concern for melena, though Hgb has been stable and melena has resolved. Her metop was increased from 25 BID to TID on 1/23. TTE with 50-55% and mod pericardial effusion. She reported some dyspnea and cough on 1/24 and was found to have new small effusion on chest x-ray. She was started on heparin  gtt on 1/25 with plan to transition back to her home Eliquis  if Hgb is stable.    #Afib with RVR #HFimprovedEF 50-55% #Moderate pericardial effusion - Reporting persistent cough and dyspnea - Switch metop 25 TID to her home dose of 75 mg BID; if BP remains stable over the next 24 hrs, can  add her home Dilt ER 360 mg daily - Will discontinue amio given that her afib was in setting of acute illness, and I do not want to commit to longterm amio therapy - IV lasix  40 mg BID given new dyspnea and effusion on x-ray - Started heparin  gtt today with plan to switch to Eliquis  if Hgb remains stable - Repeat limited echo for pericardial effusion - We will cont to follow  For questions or updates, please contact [facility] Please consult [email or web address] for contact info under  Signed, [name] Sherry Donley, MD  "

## 2025-01-19 ENCOUNTER — Inpatient Hospital Stay (HOSPITAL_COMMUNITY)

## 2025-01-19 DIAGNOSIS — Z7189 Other specified counseling: Secondary | ICD-10-CM | POA: Diagnosis not present

## 2025-01-19 DIAGNOSIS — J90 Pleural effusion, not elsewhere classified: Secondary | ICD-10-CM

## 2025-01-19 DIAGNOSIS — E785 Hyperlipidemia, unspecified: Secondary | ICD-10-CM

## 2025-01-19 DIAGNOSIS — I4891 Unspecified atrial fibrillation: Secondary | ICD-10-CM

## 2025-01-19 DIAGNOSIS — I3139 Other pericardial effusion (noninflammatory): Secondary | ICD-10-CM

## 2025-01-19 DIAGNOSIS — Z7901 Long term (current) use of anticoagulants: Secondary | ICD-10-CM

## 2025-01-19 DIAGNOSIS — K3533 Acute appendicitis with perforation and localized peritonitis, with abscess: Secondary | ICD-10-CM | POA: Diagnosis not present

## 2025-01-19 DIAGNOSIS — I5023 Acute on chronic systolic (congestive) heart failure: Secondary | ICD-10-CM | POA: Diagnosis not present

## 2025-01-19 DIAGNOSIS — K3580 Unspecified acute appendicitis: Secondary | ICD-10-CM | POA: Diagnosis not present

## 2025-01-19 LAB — RENAL FUNCTION PANEL
Albumin: 2.8 g/dL — ABNORMAL LOW (ref 3.5–5.0)
Anion gap: 12 (ref 5–15)
BUN: 18 mg/dL (ref 8–23)
CO2: 17 mmol/L — ABNORMAL LOW (ref 22–32)
Calcium: 6.5 mg/dL — ABNORMAL LOW (ref 8.9–10.3)
Chloride: 107 mmol/L (ref 98–111)
Creatinine, Ser: 1.64 mg/dL — ABNORMAL HIGH (ref 0.44–1.00)
GFR, Estimated: 32 mL/min — ABNORMAL LOW
Glucose, Bld: 80 mg/dL (ref 70–99)
Phosphorus: 2.1 mg/dL — ABNORMAL LOW (ref 2.5–4.6)
Potassium: 3.9 mmol/L (ref 3.5–5.1)
Sodium: 136 mmol/L (ref 135–145)

## 2025-01-19 LAB — MRSA NEXT GEN BY PCR, NASAL: MRSA by PCR Next Gen: NOT DETECTED

## 2025-01-19 LAB — CBC
HCT: 27.9 % — ABNORMAL LOW (ref 36.0–46.0)
Hemoglobin: 9 g/dL — ABNORMAL LOW (ref 12.0–15.0)
MCH: 33.8 pg (ref 26.0–34.0)
MCHC: 32.3 g/dL (ref 30.0–36.0)
MCV: 104.9 fL — ABNORMAL HIGH (ref 80.0–100.0)
Platelets: 227 10*3/uL (ref 150–400)
RBC: 2.66 MIL/uL — ABNORMAL LOW (ref 3.87–5.11)
RDW: 18.5 % — ABNORMAL HIGH (ref 11.5–15.5)
WBC: 117.6 10*3/uL (ref 4.0–10.5)
nRBC: 0.1 % (ref 0.0–0.2)

## 2025-01-19 LAB — HEPARIN LEVEL (UNFRACTIONATED)
Heparin Unfractionated: 0.2 [IU]/mL — ABNORMAL LOW (ref 0.30–0.70)
Heparin Unfractionated: 0.58 [IU]/mL (ref 0.30–0.70)

## 2025-01-19 LAB — VITAMIN D 25 HYDROXY (VIT D DEFICIENCY, FRACTURES): Vit D, 25-Hydroxy: 31.2 ng/mL (ref 30–100)

## 2025-01-19 LAB — MAGNESIUM: Magnesium: 1.8 mg/dL (ref 1.7–2.4)

## 2025-01-19 MED ORDER — K PHOS MONO-SOD PHOS DI & MONO 155-852-130 MG PO TABS
500.0000 mg | ORAL_TABLET | ORAL | Status: AC
  Administered 2025-01-19 (×3): 500 mg via ORAL
  Filled 2025-01-19 (×4): qty 2

## 2025-01-19 MED ORDER — CALCIUM GLUCONATE-NACL 2-0.675 GM/100ML-% IV SOLN
2.0000 g | Freq: Once | INTRAVENOUS | Status: AC
  Administered 2025-01-19: 2000 mg via INTRAVENOUS
  Filled 2025-01-19: qty 100

## 2025-01-19 MED ORDER — FUROSEMIDE 40 MG PO TABS
40.0000 mg | ORAL_TABLET | Freq: Every day | ORAL | Status: DC
  Administered 2025-01-20: 40 mg via ORAL
  Filled 2025-01-19: qty 1

## 2025-01-19 MED ORDER — SODIUM CHLORIDE 0.9 % IV SOLN
2.0000 g | INTRAVENOUS | Status: DC
  Administered 2025-01-19 – 2025-01-20 (×2): 2 g via INTRAVENOUS
  Filled 2025-01-19 (×2): qty 20

## 2025-01-19 MED ORDER — SODIUM BICARBONATE 650 MG PO TABS
650.0000 mg | ORAL_TABLET | Freq: Three times a day (TID) | ORAL | Status: DC
  Administered 2025-01-19 – 2025-01-20 (×3): 650 mg via ORAL
  Filled 2025-01-19 (×4): qty 1

## 2025-01-19 MED ORDER — AMIODARONE HCL 200 MG PO TABS
200.0000 mg | ORAL_TABLET | Freq: Two times a day (BID) | ORAL | Status: DC
  Administered 2025-01-19 – 2025-01-20 (×2): 200 mg via ORAL
  Filled 2025-01-19 (×2): qty 1

## 2025-01-19 NOTE — Progress Notes (Signed)
 "  Progress Note     Subjective: Patient reports some RLQ abdominal pain that has been helped this AM with medications. Had a BM yesterday that was dark. None today. Denies n/v, flatulence.   ROS  All negative with the exception of above.  Objective: Vital signs in last 24 hours: Temp:  [98 F (36.7 C)-98.9 F (37.2 C)] 98 F (36.7 C) (01/26 0407) Pulse Rate:  [73-86] 79 (01/26 0407) Resp:  [14-18] 14 (01/26 0407) BP: (99-173)/(51-90) 99/51 (01/26 0407) SpO2:  [92 %-97 %] 92 % (01/26 0407) Last BM Date : 01/18/25  Intake/Output from previous day: 01/25 0701 - 01/26 0700 In: 736.7 [P.O.:240; I.V.:190.3; IV Piggyback:301.4] Out: 360 [Urine:350; Drains:10] Intake/Output this shift: No intake/output data recorded.  PE: General: Pleasant chronically ill appearing female who is laying in bed in NAD. Chest: Nonlabored breathing on room air. Abd: Soft, nondistended.  Minimal tenderness to palpation of RLQ.  No peritoneal signs.  Drain output is minimal slightly murky serosanguineous. Skin: Warm and dry.  Psych: A&Ox3 with an appropriate affect.     Lab Results:  Recent Labs    01/18/25 0448 01/19/25 0530  WBC 105.6* 117.6*  HGB 9.4* 9.0*  HCT 30.5* 27.9*  PLT 202 227   BMET Recent Labs    01/18/25 0448 01/19/25 0530  NA 134* 136  K 4.4 3.9  CL 105 107  CO2 17* 17*  GLUCOSE 150* 80  BUN 12 18  CREATININE 1.19* 1.64*  CALCIUM  6.8* 6.5*   PT/INR No results for input(s): LABPROT, INR in the last 72 hours. CMP     Component Value Date/Time   NA 136 01/19/2025 0530   NA 141 01/04/2016 1001   K 3.9 01/19/2025 0530   K 4.5 01/04/2016 1001   CL 107 01/19/2025 0530   CO2 17 (L) 01/19/2025 0530   CO2 24 01/04/2016 1001   GLUCOSE 80 01/19/2025 0530   GLUCOSE 105 01/04/2016 1001   BUN 18 01/19/2025 0530   BUN 21.7 01/04/2016 1001   CREATININE 1.64 (H) 01/19/2025 0530   CREATININE 1.48 (H) 12/15/2024 1057   CREATININE 1.2 (H) 01/04/2016 1001   CALCIUM  6.5  (L) 01/19/2025 0530   CALCIUM  9.7 01/04/2016 1001   PROT 5.2 (L) 01/15/2025 0830   PROT 7.0 01/04/2016 1001   ALBUMIN  2.8 (L) 01/19/2025 0530   ALBUMIN  4.0 01/04/2016 1001   AST 19 01/15/2025 0830   AST 21 12/21/2023 1359   AST 26 01/04/2016 1001   ALT 9 01/15/2025 0830   ALT 14 12/21/2023 1359   ALT 24 01/04/2016 1001   ALKPHOS 82 01/15/2025 0830   ALKPHOS 61 01/04/2016 1001   BILITOT 0.4 01/15/2025 0830   BILITOT 0.8 12/21/2023 1359   BILITOT 0.41 01/04/2016 1001   GFRNONAA 32 (L) 01/19/2025 0530   GFRNONAA 36 (L) 12/15/2024 1057   GFRAA 56 (L) 01/09/2020 1339   Lipase     Component Value Date/Time   LIPASE 26 01/12/2025 1123       Studies/Results: ECHOCARDIOGRAM LIMITED Result Date: 01/18/2025    ECHOCARDIOGRAM LIMITED REPORT   Patient Name:   [name] Date of Exam: 01/18/2025 Medical Rec #:  [id number]        Height:       61.0 in Accession #:    [id number]       Weight:       105.2 lb Date of Birth:  Sep 19, 1946       BSA:  1.437 m Patient Age:    79 years         BP:           119/64 mmHg Patient Gender: F                HR:           77 bpm. Exam Location:  Inpatient Procedure: Cardiac Doppler, Limited Echo and Limited Color Doppler (Both            Spectral and Color Flow Doppler were utilized during procedure). Indications:    Pericardial effusion I31.3  History:        Patient has prior history of Echocardiogram examinations, most                 recent 01/15/2025. Risk Factors:Hypertension.  Sonographer:    Sydnee [name] RDCS Referring Phys: [id number] FRANCK H AZOBOU TONLEU IMPRESSIONS  1. Left ventricular ejection fraction, by estimation, is 55 to 60%. Left ventricular ejection fraction by 2D MOD biplane is 57.1 %. The left ventricle has normal function.  2. A small pericardial effusion is present. The pericardial effusion is posterior to the left ventricle. Large pleural effusion in the left lateral region.  3. Aortic valve sclerosis/calcification is present,  without any evidence of aortic stenosis. Comparison(s): Prior images reviewed side by side. There remains a large pleural effusion but pericardial effusion is now small (best seen in subcostal view). FINDINGS  Left Ventricle: Left ventricular ejection fraction, by estimation, is 55 to 60%. Left ventricular ejection fraction by 2D MOD biplane is 57.1 %. The left ventricle has normal function. Pericardium: A small pericardial effusion is present. The pericardial effusion is posterior to the left ventricle. Aortic Valve: Aortic valve sclerosis/calcification is present, without any evidence of aortic stenosis. Additional Comments: There is a large pleural effusion in the left lateral region.   LV Volumes (MOD)               Biplane EF (MOD) LV vol d, MOD    57.2 ml       LV Biplane EF:   Left A2C:                                            ventricular LV vol d, MOD    58.2 ml                        ejection A4C:                                            fraction by LV vol s, MOD    24.1 ml                        2D MOD A2C:                                            biplane is LV vol s, MOD    23.6 ml                        57.1 %.  A4C: LV SV MOD A2C:   33.1 ml LV SV MOD A4C:   58.2 ml LV SV MOD BP:    33.0 ml Franck Azobou Tonleu Electronically signed by Joelle Cedars Tonleu Signature Date/Time: 01/18/2025/11:12:50 AM    Final     Anti-infectives: Anti-infectives (From admission, onward)    Start     Dose/Rate Route Frequency Ordered Stop   01/19/25 0600  metroNIDAZOLE  (FLAGYL ) IVPB 500 mg        500 mg 100 mL/hr over 60 Minutes Intravenous Every 12 hours 01/18/25 2003     01/14/25 0600  ceFEPIme  (MAXIPIME ) 2 g in sodium chloride  0.9 % 100 mL IVPB        2 g 200 mL/hr over 30 Minutes Intravenous Every 12 hours 01/13/25 1629     01/12/25 1830  metroNIDAZOLE  (FLAGYL ) IVPB 500 mg  Status:  Discontinued        500 mg 100 mL/hr over 60 Minutes Intravenous Every 12 hours 01/12/25 1822 01/18/25 2003   01/12/25  1830  ceFEPIme  (MAXIPIME ) 2 g in sodium chloride  0.9 % 100 mL IVPB  Status:  Discontinued        2 g 200 mL/hr over 30 Minutes Intravenous Every 8 hours 01/12/25 1825 01/13/25 1629   01/12/25 1445  ceFEPIme  (MAXIPIME ) 2 g in sodium chloride  0.9 % 100 mL IVPB       Placed in And Linked Group   2 g 200 mL/hr over 30 Minutes Intravenous  Once 01/12/25 1436 01/12/25 1533   01/12/25 1445  metroNIDAZOLE  (FLAGYL ) IVPB 500 mg       Placed in And Linked Group   500 mg 100 mL/hr over 60 Minutes Intravenous  Once 01/12/25 1436 01/12/25 1606        Assessment/Plan Periappendiceal abscess -Reviewed with IR 1/19, plan at that time for repeat CT in a few days - CT 1/22 with minimal change and subsequently IR drain placed successfully. -Afebrile, WBC remains elevated 117.6 from 105.6. -Continue with IV abx  -Given her frailty and multiple medical problems including recent chemo, daily steroid use, Eliquis , active A-fib with RVR and hypotension etc, we pan to continue to treat with conservative management at this time and avoid surgical intervention if able. She is extremely high risk.   -Per primary team, pt being considered for thoracentesis for pleural effusions.  Suspect her white count is partially coming from her lungs at this point. On IV Lasix  by cardiology.   FEN -gluten-free diet/1500 mL fluid restriction; IVFs per primary team VTE - Hold Eliquis , Heparin  infusions IV ID - Cefipime/Flagyl    UGI Bleed/Melena/anemia - unclear etiology.  No history of ulcer disease or NSAID use.  Her hgb appears stable at this time and she is HDS stable considering her Afib with RVR.  Choledocholithiasis - seems to be asymptomatic.  LFTs are normal indicating likely this to be nonocclusive.  Monitor LFTs.  At some point, will likely need this addressed, but as of now this is not the most acute problem. Dr. Kristie evaluating. 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 WBC baseline prior to admission  ranging from 25-47 CHF/ cardiomyopathy A fib, RVR - cardiology consulted, echo pending. 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  This care required moderate level of medical decision making.   Marjorie Carlyon Favre, [facility] Surgery 01/19/2025, 8:48 AM Please see Amion for pager number during day hours 7:00am-4:30pm  "

## 2025-01-19 NOTE — Progress Notes (Signed)
 "   Referring Physician(s): Dr. Mitzie Freund, MD  Supervising Physician: Luverne Aran  Patient Status:  [facility] - In-pt  Chief Complaint: Intra-abdominal fluid collection s/p drain placement on 1/22.  Subjective: Patient alert and laying in bed, calm. Her husband is at her side. Currently without any significant complaints. She states she does not feel her drain. Patient denies any fevers, headache, chest pain, SOB, cough, abdominal pain, nausea, vomiting or bleeding.   Allergies: Digoxin  and related, Gluten meal, Penicillins, Fexofenadine, Alendronate, and Hydroxychloroquine  Medications: Prior to Admission medications  Medication Sig Start Date End Date Taking? Authorizing Provider  acetaminophen  (TYLENOL ) 500 MG tablet Take 2 tablets (1,000 mg total) by mouth 3 (three) times daily. Patient taking differently: Take 1,000 mg by mouth daily at 12 noon. 11/14/23  Yes Samtani, Jai-Gurmukh, MD  apixaban  (ELIQUIS ) 5 MG TABS tablet TAKE 1 TABLET(5 MG) BY MOUTH TWICE DAILY 01/06/25  Yes Tolia, Sunit, DO  azaCITIDine  (VIDAZA ) 100 MG SUSR Inject 114 mg into the vein every 6 (six) weeks. 03/29/23  Yes [provider]  CLARITIN  10 MG tablet Take 10 mg by mouth daily.   Yes [provider]  denosumab  (PROLIA ) 60 MG/ML SOSY injection Inject 60 mg into the skin every 6 (six) months.   Yes [provider]  diltiazem  (CARDIZEM  CD) 360 MG 24 hr capsule Take 1 capsule (360 mg total) by mouth daily. 07/21/24  Yes Swinyer, Rosaline HERO, NP  furosemide  (LASIX ) 20 MG tablet Take 20 mg by mouth in the morning.   Yes [provider]  gabapentin  (NEURONTIN ) 300 MG capsule Take 300 mg by mouth in the morning. 07/21/14  Yes [provider]  inFLIXimab  (REMICADE ) 100 MG injection Inject into the vein every 8 (eight) weeks.   Yes [provider]  Metoprolol  Tartrate 75 MG TABS Take 2 tablets (150 mg total) by mouth 2 (two) times daily. 11/19/24  Yes Tolia, Sunit, DO   ondansetron  (ZOFRAN ) 4 MG tablet Take 1 tablet (4 mg total) by mouth every 8 (eight) hours as needed for nausea or vomiting. 09/23/24  Yes [name], MD  predniSONE  (DELTASONE ) 5 MG tablet Take 5 mg by mouth daily with breakfast. 09/22/21  Yes [provider]  Tiotropium Bromide-Olodaterol (STIOLTO RESPIMAT ) 2.5-2.5 MCG/ACT AERS Inhale 2 puffs into the lungs daily. 05/21/24  Yes Cobb, Comer GAILS, NP  furosemide  (LASIX ) 20 MG tablet Take 1 tablet (20 mg total) by mouth daily. Patient not taking: Reported on 01/12/2025 05/21/22 01/12/25  Antoinette Doe, MD  nitrofurantoin , macrocrystal-monohydrate, (MACROBID ) 100 MG capsule Take 1 capsule (100 mg total) by mouth 2 (two) times daily. Patient not taking: Reported on 01/12/2025 11/25/24   Hanford Powell BRAVO, NP     Vital Signs: BP (!) 89/75 (BP Location: Right Arm)   Pulse 90   Temp 98.3 F (36.8 C) (Oral)   Resp 15   Ht 5' 1 (1.549 m)   Wt 105 lb 2.6 oz (47.7 kg)   SpO2 92%   BMI 19.87 kg/m   Physical Exam Constitutional:      Appearance: Normal appearance.  Cardiovascular:     Rate and Rhythm: Normal rate.  Pulmonary:     Effort: Pulmonary effort is normal.  Abdominal:     General: Abdomen is flat.     Comments: RLQ drain appropriately dressed. Dressing is clean, dry, intact. Drain incision site non-tender, without evidence of infection. Retaining suture and Stat Lock in place.  5 ml or serosanguinous output in collection  bulb. Line flushes well.     Musculoskeletal:        General: Normal range of motion.  Skin:    General: Skin is warm and dry.  Neurological:     Mental Status: She is alert and oriented to person, place, and time.    NAD, alert Abdomen: RLQ drain in place.  Insertion site intact, suture in place.  Significant amount of blood-tinged beige output on dressing around the drain with only a small amount of serous fluid in the bulb.  Flushes and aspirates with small amount of thick fibrinous clot.    Imaging: ECHOCARDIOGRAM LIMITED Result Date: 01/18/2025    ECHOCARDIOGRAM LIMITED REPORT   Patient Name:   [name] [facility] Date of Exam: 01/18/2025 Medical Rec #:  [id number]        Height:       61.0 in Accession #:    [id number]       Weight:       105.2 lb Date of Birth:  Jul 16, 1946       BSA:          1.437 m Patient Age:    78 years         BP:           119/64 mmHg Patient Gender: F                HR:           77 bpm. Exam Location:  Inpatient Procedure: Cardiac Doppler, Limited Echo and Limited Color Doppler (Both            Spectral and Color Flow Doppler were utilized during procedure). Indications:    Pericardial effusion I31.3  History:        Patient has prior history of Echocardiogram examinations, most                 recent 01/15/2025. Risk Factors:Hypertension.  Sonographer:    Sydnee [name] RDCS Referring Phys: [id number] FRANCK H AZOBOU TONLEU IMPRESSIONS  1. Left ventricular ejection fraction, by estimation, is 55 to 60%. Left ventricular ejection fraction by 2D MOD biplane is 57.1 %. The left ventricle has normal function.  2. A small pericardial effusion is present. The pericardial effusion is posterior to the left ventricle. Large pleural effusion in the left lateral region.  3. Aortic valve sclerosis/calcification is present, without any evidence of aortic stenosis. Comparison(s): Prior images reviewed side by side. There remains a large pleural effusion but pericardial effusion is now small (best seen in subcostal view). FINDINGS  Left Ventricle: Left ventricular ejection fraction, by estimation, is 55 to 60%. Left ventricular ejection fraction by 2D MOD biplane is 57.1 %. The left ventricle has normal function. Pericardium: A small pericardial effusion is present. The pericardial effusion is posterior to the left ventricle. Aortic Valve: Aortic valve sclerosis/calcification is present, without any evidence of aortic stenosis. Additional Comments: There is a large pleural effusion in the  left lateral region.   LV Volumes (MOD)               Biplane EF (MOD) LV vol d, MOD    57.2 ml       LV Biplane EF:   Left A2C:                                            ventricular  LV vol d, MOD    58.2 ml                        ejection A4C:                                            fraction by LV vol s, MOD    24.1 ml                        2D MOD A2C:                                            biplane is LV vol s, MOD    23.6 ml                        57.1 %. A4C: LV SV MOD A2C:   33.1 ml LV SV MOD A4C:   58.2 ml LV SV MOD BP:    33.0 ml Franck Azobou Tonleu Electronically signed by Joelle Ren Ny Signature Date/Time: 01/18/2025/11:12:50 AM    Final    DG Chest Port 1 View Result Date: 01/17/2025 EXAM: 1 VIEW(S) XRAY OF THE CHEST 01/17/2025 08:26:32 AM COMPARISON: 11/12/2023 CLINICAL HISTORY: Shortness of breath. FINDINGS: LINES, TUBES AND DEVICES: Right chest port with catheter tip at proximal right atrium. LUNGS AND PLEURA: New small bilateral pleural effusions, left greater than right. New bilateral interstitial opacities, and airspace opacities in the lung bases left worse than right. No pneumothorax. HEART AND MEDIASTINUM: Mild cardiomegaly. Aortic arch atherosclerosis. No acute abnormality of the cardiac and mediastinal silhouettes. BONES AND SOFT TISSUES: Stable right shoulder arthroplasty. No acute osseous abnormality. IMPRESSION: 1. New bilateral interstitial and airspace opacities in the lung bases, left worse than right. 2. New small bilateral pleural effusions, left greater than right. 3. Mild cardiomegaly. Electronically signed by: Katheleen Faes MD 01/17/2025 03:24 PM EST RP Workstation: [id number]    Labs:  CBC: Recent Labs    01/16/25 0815 01/17/25 0649 01/18/25 0448 01/19/25 0530  WBC 87.6* 80.1* 105.6* 117.6*  HGB 9.7* 8.6* 9.4* 9.0*  HCT 32.0* 26.8* 30.5* 27.9*  PLT 172 174 202 227    COAGS: Recent Labs    01/12/25 1123 01/15/25 0830 01/18/25 1612  INR 1.7*  1.8*  --   APTT 33  --  66*    BMP: Recent Labs    01/16/25 0815 01/17/25 0649 01/18/25 0448 01/19/25 0530  NA 135 138 134* 136  K 3.8 3.3* 4.4 3.9  CL 106 107 105 107  CO2 19* 19* 17* 17*  GLUCOSE 92 107* 150* 80  BUN 11 11 12 18   CALCIUM  6.6* 7.0* 6.8* 6.5*  CREATININE 0.99 1.02* 1.19* 1.64*  GFRNONAA 58* 56* 47* 32*    LIVER FUNCTION TESTS: Recent Labs    01/12/25 1123 01/13/25 0615 01/14/25 0304 01/15/25 0830 01/16/25 0815 01/17/25 0649 01/18/25 0448 01/19/25 0530  BILITOT 0.6 0.4 0.4 0.4  --   --   --   --   AST 26 22 19 19   --   --   --   --   ALT 12 10 10 9   --   --   --   --  ALKPHOS 105 80 86 82  --   --   --   --   PROT 6.3* 4.9* 5.0* 5.2*  --   --   --   --   ALBUMIN  3.5 2.8* 2.9* 2.8* 2.8* 3.1* 3.1* 2.8*    Assessment and Plan: Intra-abdominal fluid collection s/p RLQ drain placement 1/22 by Dr. Jennefer Ribas Location: RLQ Size: Fr size: 8 Fr mini loop Date of placement: 1/22  Currently to: Drain collection device: suction bulb 24 hour output:  Output by Drain (mL) 01/17/25 0701 - 01/17/25 1900 01/17/25 1901 - 01/18/25 0700 01/18/25 0701 - 01/18/25 1900 01/18/25 1901 - 01/19/25 0700 01/19/25 0701 - 01/19/25 1706  Closed System Drain Inferior;Lateral;Right Back 15 15  10      Interval imaging/drain manipulation:  None  Current examination: Flushes/aspirates easily.  Insertion site unremarkable. Suture in place. Dressed appropriately.   Plan: Continue TID flushes with 5 cc NS. Record output Q shift. Dressing changes QD or PRN if soiled.  Dressing changed at visit today.   Discharge planning: Please contact IR APP or on call IR MD prior to patient d/c to ensure appropriate follow up plans are in place. Typically patient will follow up with IR clinic 10-14 days post d/c for repeat imaging/possible drain injection. IR scheduler will contact patient with date/time of appointment. Patient will need to flush drain QD with 5 cc NS, record output  QD, dressing changes every 2-3 days or earlier if soiled.   IR will continue to follow - please call with questions or concerns.  Electronically Signed: Carlin DELENA Griffon, PA-C 01/19/2025, 5:03 PM   I spent a total of 15 Minutes at the the patient's bedside AND on the patient's hospital floor or unit, greater than 50% of which was counseling/coordinating care for intra-abdominal fluid collection.   "

## 2025-01-19 NOTE — Progress Notes (Addendum)
 " PROGRESS NOTE  [name] FMW:[id number] DOB: 10-27-1946   PCP: [name], MD  Patient is from: Home.  Lives with husband.  Uses rolling walker for mobility.  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    In ED, in A-fib with RVR with HR from 120s to 140s.  WBC 84.9 (baseline 40-50).  Hgb 10 (baseline 9-10).  Fecal Hemoccult positive.  CT abdomen and pelvis showed acute appendicitis with periappendiceal abscess, cholelithiasis with linear calcification of the distal CBD suspicious for choledocholithiasis.  General surgery consulted.  Patient was started on IV cefepime  and Flagyl , IV fluids and admitted for further care.  The next day, started on IV Cardizem .  Remained in RVR despite IV Cardizem .  Transitioned to IV amiodarone .  Cardiology following.  Repeat CT abdomen and pelvis on 1/21 without significant change.  Surgery consulted IR for possible periappendiceal abscess drainage, also recommended GI consult for anemia and possible choledocholithiasis.  She had drain placed by IR on 1/22.  Abscess culture with rare GNR.  Remains on IV cefepime  and Flagyl .  GI consulted for anemia and possible choledocholithiasis but no plan for any procedure due to risk.   Subjective: Seen and examined earlier this morning.  No major events overnight or this morning.   She reports vomiting after she had a coffee this morning.  Not sure about abdominal pain and shortness of breath. She is somewhat confused this morning.  Does not recall few things.  Husband  at bedside.  Assessment and plan: Acute appendicitis with periappendiceal abscess-presents with RLQ pain, nausea and vomiting.  Markedly elevated leukocytosis above baseline.  Repeat CT without significant change. -Deemed to be high risk for surgery with underlying comorbidity. -S/p drain placement for periappendiceal abscess on 1/22.  Gram stain GNR.  Culture multiple species.   -Continue IV cefepime  and Flagyl  1/19>> CTX and Flagyl  1/26>> -General Surgery and IR following.   Persistent A-fib with RVR: Remained in RVR on Cardizem  drip with soft blood pressure and transition to amiodarone  drip.  TTE with LVEF of 50 to 55%, moderate pericardial effusion, large pleural effusion in the left lateral region.  Currently in sinus rhythm -Off amiodarone  drip to p.o. metoprolol  -Eliquis  on hold.  On heparin  for anticoagulation. -Optimize electrolytes.  Normocytic anemia: Patient reports melanotic stool.  Heme occult positive but H&H relatively at baseline.  Hgb stable after initial drop.  No overt bleeding.  Recent Labs    11/25/24 0938 12/15/24 1057 01/12/25 1123 01/13/25 0615 01/14/25 0304 01/15/25 0830 01/16/25 0815 01/17/25 0649 01/18/25 0448 01/19/25 0530  HGB 10.4* 9.5* 10.4* 9.1* 8.6* 9.1* 9.7* 8.6* 9.4* 9.0*  -Continue IV Protonix . -Appreciate input by GI-no plan for procedure due to risk.  Acute on chronic HFmrEF: TTE  LVEF of 50 to 55% (45 to 50% in 2023), moderate pericardial effusion, large pleural effusion in the left lateral region. Overall, appears euvolemic except for edema.  Seems to be on p.o. Lasix  20 mg daily at home.  Some dyspnea and cough here.  Portable CXR concerning for CHF.  proBNP  elevated to 8000. -Transitioned to p.o. Lasix  40 mg daily by cardiology. -Intake and output, daily weight, renal functions and electrolytes   Moderate pericardial effusion: Noted on echocardiogram.   - Defer to cardiology  Small bilateral pleural effusion: Echocardiogram and CT  abdomen and pelvis suggested moderate but portable CXR with small bilateral pleural effusion. Patient with history of CHF and anemia and malignancy.  Prior CT in 08/2024 also showed left axillary adenopathy and stable right greater than left paraspinous and pleural-based masses.  - Diuretics as above  Cholelithiasis with possible choledocholithiasis: Noted on initial and repeat CT.  No RUQ tenderness. LFT within normal.  I wonder if this is some calcification versus true choledocholithiasis. - GI on board-no plan for procedure.  Physical deconditioning: Patient reports ambulating with walker at baseline. - PT/OT recommended CIR.  CIR following.  Chronic COPD: Stable. 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   CMML with elevated WBC: Open we does.  Leukocytosis up to 117,000. 30k-50k at baseline. -Defer to oncology.   Alcohol  use: No withdrawal symptoms. - Continue monitoring  Back pain: Likely from lying in bed continuously -Scheduled Tylenol  every 6 hours while awake -Oxycodone  and IV morphine  as needed - OOB, PT/OT  Hypokalemia/hypophosphatemia/hypocalcemia -Check vitamin D  and PTH level for refractory hypocalcemia - Monitor replenish as appropriate  Thrombocytopenia: Mild - Continue monitoring  Advance care planning: Full code with full scope of care.   See IPAL note on 1/23.  - Palliative on board.  Metabolic acidosis - P.o. sodium bicarbonate   Moderate malnutrition Body mass index is 19.87 kg/m. 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 Wound 01/13/25 1900 Pressure Injury Buttocks Left;Lower Stage 1 -  Intact skin with non-blanchable redness of a localized area usually over a bony prominence. (Active)    Pressure skin injury: She has dark blister on right leg. -Consult wound care DVT prophylaxis:  SCDs Start: 01/12/25 1820  Code Status: Full code Family Communication: Updated patient's husband at bedside Level of care: Progressive Status is: Inpatient Remains inpatient appropriate because: Acute appendicitis with periappendiceal abscess, A-fib, acute CHF,   Final disposition: CIR   55 minutes with more than 50% spent in reviewing records, counseling patient/family and coordinating care.  Consultants:  General Surgery Cardiology Oncology/hematology Gastroenterology Palliative medicine  Procedures: None  Microbiology summarized: None  Objective: Vitals:   01/18/25 2105 01/18/25 2111 01/19/25 0407 01/19/25 1209  BP: 117/63  (!) 99/51 (!) 103/55  Pulse: 86  79 90  Resp:   14 14  Temp:   98 F (36.7 C) 98.1 F (36.7 C)  TempSrc:      SpO2:  92% 92% 93%  Weight:      Height:        Examination:  GENERAL: No apparent distress.  Nontoxic. HEENT: MMM.  Vision and hearing grossly intact.  NECK: Supple.  No apparent JVD.  RESP:  No IWOB.  Somewhat diminished aeration over lung bases. CVS: RRR at 80.  Heart sounds normal.  ABD/GI/GU: BS+.  Mild RLQ tenderness.   MSK/EXT:  Moves extremities. No apparent deformity.  Trace BLE edema.  SKIN: As above. NEURO: AA.  Oriented failure.  No apparent focal neuro deficit. PSYCH: Calm. Normal affect.   Sch Meds:  Scheduled Meds:  acetaminophen   650 mg Oral Q6H  WA   arformoterol   15 mcg Nebulization BID   And   umeclidinium bromide   1 puff Inhalation Daily   Chlorhexidine  Gluconate Cloth  6 each Topical Daily   [START ON 01/20/2025] furosemide   40 mg Oral Daily   gabapentin   300 mg Oral Daily   lactose free nutrition  237 mL Oral BID BM   metoprolol  tartrate  75 mg Oral BID   pantoprazole  (PROTONIX ) IV  40 mg Intravenous BID   phosphorus  500 mg Oral Q4H   polyethylene glycol  17 g Oral BID   predniSONE   5 mg Oral Q  breakfast   sodium bicarbonate   650 mg Oral TID   sodium chloride  flush  10-40 mL Intracatheter Q12H   sodium chloride  flush  5 mL Intracatheter Q8H   Continuous Infusions:  ceFEPime  (MAXIPIME ) IV 2 g (01/19/25 0509)   heparin  850 Units/hr (01/19/25 0304)   metronidazole  500 mg (01/19/25 0559)   PRN Meds:.ipratropium-albuterol , lip balm, morphine  injection, ondansetron  **OR** ondansetron  (ZOFRAN ) IV, mouth rinse, oxyCODONE   Antimicrobials: Anti-infectives (From admission, onward)    Start     Dose/Rate Route Frequency Ordered Stop   01/19/25 0600  metroNIDAZOLE  (FLAGYL ) IVPB 500 mg        500 mg 100 mL/hr over 60 Minutes Intravenous Every 12 hours 01/18/25 2003     01/14/25 0600  ceFEPIme  (MAXIPIME ) 2 g in sodium chloride  0.9 % 100 mL IVPB        2 g 200 mL/hr over 30 Minutes Intravenous Every 12 hours 01/13/25 1629     01/12/25 1830  metroNIDAZOLE  (FLAGYL ) IVPB 500 mg  Status:  Discontinued        500 mg 100 mL/hr over 60 Minutes Intravenous Every 12 hours 01/12/25 1822 01/18/25 2003   01/12/25 1830  ceFEPIme  (MAXIPIME ) 2 g in sodium chloride  0.9 % 100 mL IVPB  Status:  Discontinued        2 g 200 mL/hr over 30 Minutes Intravenous Every 8 hours 01/12/25 1825 01/13/25 1629   01/12/25 1445  ceFEPIme  (MAXIPIME ) 2 g in sodium chloride  0.9 % 100 mL IVPB       Placed in And Linked Group   2 g 200 mL/hr over 30 Minutes Intravenous  Once 01/12/25 1436 01/12/25 1533   01/12/25 1445  metroNIDAZOLE  (FLAGYL ) IVPB 500 mg       Placed in And Linked Group   500 mg 100 mL/hr over 60 Minutes Intravenous  Once 01/12/25 1436 01/12/25 1606        I have personally reviewed the following labs and images: CBC: Recent Labs  Lab 01/14/25 0304 01/15/25 0830 01/16/25 0815 01/17/25 0649 01/18/25 0448 01/19/25 0530  WBC 70.3* 77.7* 87.6* 80.1* 105.6* 117.6*  NEUTROABS 44.3*  --   --   --  47.5*  --   HGB 8.6* 9.1* 9.7* 8.6* 9.4* 9.0*  HCT 26.8* 29.9* 32.0* 26.8* 30.5* 27.9*  MCV  105.5* 109.5* 109.2* 104.7* 106.3* 104.9*  PLT 149* 150 172 174 202 227   BMP &GFR Recent Labs  Lab 01/14/25 0304 01/15/25 0830 01/16/25 0815 01/17/25 0649 01/18/25 0448 01/19/25 0530  NA 136 136 135 138 134* 136  K 3.8 3.8 3.8 3.3* 4.4 3.9  CL 105 107 106 107 105 107  CO2 24 20* 19* 19* 17* 17*  GLUCOSE 100* 96 92 107* 150* 80  BUN 17 12 11 11 12 18   CREATININE 0.98 0.89 0.99 1.02* 1.19* 1.64*  CALCIUM  6.8* 6.6* 6.6* 7.0*  6.8* 6.5*  MG 2.2 2.2 2.1 2.1 1.9 1.8  PHOS 2.5  --  1.4* 3.2 2.0* 2.1*   Estimated Creatinine Clearance: 21.3 mL/min (A) (by C-G formula based on SCr of 1.64 mg/dL (H)). Liver & Pancreas: Recent Labs  Lab 01/13/25 0615 01/14/25 0304 01/15/25 0830 01/16/25 0815 01/17/25 0649 01/18/25 0448 01/19/25 0530  AST 22 19 19   --   --   --   --   ALT 10 10 9   --   --   --   --   ALKPHOS 80 86 82  --   --   --   --   BILITOT 0.4 0.4 0.4  --   --   --   --   PROT 4.9* 5.0* 5.2*  --   --   --   --   ALBUMIN  2.8* 2.9* 2.8* 2.8* 3.1* 3.1* 2.8*   No results for input(s): LIPASE, AMYLASE in the last 168 hours.  No results for input(s): AMMONIA in the last 168 hours. Diabetic: No results for input(s): HGBA1C in the last 72 hours. No results for input(s): GLUCAP in the last 168 hours. Cardiac Enzymes: No results for input(s): CKTOTAL, CKMB, CKMBINDEX, TROPONINI in the last 168 hours. Recent Labs    01/17/25 1310  PROBNP 8,319.0*   Coagulation Profile: Recent Labs  Lab 01/15/25 0830  INR 1.8*   Thyroid  Function Tests: No results for input(s): TSH, T4TOTAL, FREET4, T3FREE, THYROIDAB in the last 72 hours. Lipid Profile: No results for input(s): CHOL, HDL, LDLCALC, TRIG, CHOLHDL, LDLDIRECT in the last 72 hours. Anemia Panel: No results for input(s): VITAMINB12, FOLATE, FERRITIN, TIBC, IRON, RETICCTPCT in the last 72 hours. Urine analysis:    Component Value Date/Time   COLORURINE YELLOW 01/12/2025  1407   APPEARANCEUR CLEAR 01/12/2025 1407   LABSPEC 1.029 01/12/2025 1407   PHURINE 6.0 01/12/2025 1407   GLUCOSEU NEGATIVE 01/12/2025 1407   HGBUR SMALL (A) 01/12/2025 1407   BILIRUBINUR NEGATIVE 01/12/2025 1407   KETONESUR NEGATIVE 01/12/2025 1407   PROTEINUR NEGATIVE 01/12/2025 1407   UROBILINOGEN 0.2 04/28/2011 1859   NITRITE NEGATIVE 01/12/2025 1407   LEUKOCYTESUR NEGATIVE 01/12/2025 1407   Sepsis Labs: Invalid input(s): PROCALCITONIN, LACTICIDVEN  Microbiology: Recent Results (from the past 240 hours)  MRSA Next Gen by PCR, Nasal     Status: None   Collection Time: 01/13/25  7:03 PM   Specimen: Nasal Mucosa; Nasal Swab  Result Value Ref Range Status   MRSA by PCR Next Gen NOT DETECTED NOT DETECTED Final    Comment: (NOTE) The GeneXpert MRSA Assay (FDA approved for NASAL specimens only), is one component of a comprehensive MRSA colonization surveillance program. It is not intended to diagnose MRSA infection nor to guide or monitor treatment for MRSA infections. Test performance is not FDA approved in patients less than 35 years old. Performed at [facility], [address], [place], [place] [address]   Aerobic/Anaerobic Culture w Gram Stain (surgical/deep wound)     Status: Abnormal (Preliminary result)   Collection Time: 01/15/25 12:59 PM   Specimen: Abdomen; Abscess  Result Value Ref Range Status   Specimen Description   Final    ABDOMEN Performed at [facility], [address] [place], [place] [address]    Special Requests   Final    NONE Performed at [facility], [address] [place], [place] [address]    Gram Stain   Final    ABUNDANT WBC PRESENT,  PREDOMINANTLY PMN RARE GRAM NEGATIVE RODS Performed at [facility], [address], [place], [place] [address]    Culture (A)  Final    MULTIPLE ORGANISMS PRESENT, NONE PREDOMINANT NO ANAEROBES ISOLATED; CULTURE IN PROGRESS FOR 5 DAYS     Report Status PENDING  Incomplete    Radiology Studies: No results found.       Krystopher Kuenzel T. Shadavia Dampier [facility]  If 7PM-7AM, please contact night-coverage [email or web address] 01/19/2025, 12:13 PM   "

## 2025-01-19 NOTE — PMR Pre-admission (Shared)
 PMR Admission Coordinator Pre-Admission Assessment  Patient: [name] is an 79 y.o., female MRN: [id number] DOB: January 05, 1946 Height: 5' 1 (154.9 cm) Weight: 47.7 kg  Insurance Information HMO:     PPO:      PCP:      IPA:      80/20:      OTHER:  PRIMARY: Medicare A/B      Policy#: [id number]       Subscriber: pt CM Name:       Phone#:      Fax#:  Pre-Cert#: verified Health And Safety Inspector:  Benefits:  Phone #:      Name:  Eff. Date: 11/25/11 A/B     Deduct: $1736      Out of Pocket Max: n/a      Life Max: n/a CIR: 100%      SNF: 20 full days  Outpatient: 80%     Co-Pay: 20% Home Health: 100%      Co-Pay:  DME: 80%     Co-Pay: 20% Providers:  SECONDARY: BCBS Supplement      Policy#: [id number]     Phone#: [phone]  Financial Counselor:       Phone#:   The Data Collection Information Summary for patients in Inpatient Rehabilitation Facilities with attached Privacy Act Statement-Health Care Records was provided and verbally reviewed with: Patient and Family  Emergency Contact Information Contact Information     Name Relation Home Work Mobile   M[place] Spouse [phone]  [phone]      Other Contacts   None on File     Current Medical History  Patient Admitting Diagnosis: *** History of Present Illness: ***    Patient's medical record from [facility] has been reviewed by the rehabilitation admission coordinator and physician.  Past Medical History  Past Medical History:  Diagnosis Date   Arthritis    Rheumatoid arthritis   Celiac disease    Chronic kidney disease    stage 3 from MD notes   COPD (chronic obstructive pulmonary disease) (HCC)    Dyspnea    with going up stairs   Family history of adverse reaction to anesthesia    father had hard time waking up   Headache    sinus headaches   Hot flashes    Hypertension    Iron deficiency anemia    Pneumonia    per patient I have walking pneumonia    Has the patient had  major surgery during 100 days prior to admission? {Yes/No/Unknown:304600602}  Family History   family history includes Diabetes in her father; Peripheral Artery Disease in her father.  Current Medications Current Medications[1]  Patients Current Diet:  Diet Order             Diet gluten free Room service appropriate? Yes; Fluid consistency: Thin; Fluid restriction: 1500 mL Fluid  Diet effective now                   Precautions / Restrictions Precautions Precautions: Fall Precaution/Restrictions Comments: monitor O2 Restrictions Weight Bearing Restrictions Per Provider Order: No   Has the patient had 2 or more falls or a fall with injury in the past year? No  Prior Activity Level Limited Community (1-2x/wk): mod I with RW prior to admission, doesn't drive (spouse takes her out a few times a week), mod I with ADLs, stays on first floor of the house but does have a stair lift to get upstairs if needed  Prior Functional Level Self Care: Did the patient need help bathing, dressing, using the toilet or eating? Independent  Indoor Mobility: Did the patient need assistance with walking from room to room (with or without device)? Independent  Stairs: Did the patient need assistance with internal or external stairs (with or without device)? Needed some help  Functional Cognition: Did the patient need help planning regular tasks such as shopping or remembering to take medications? Needed some help  Patient Information Are you of Hispanic, Latino/a,or Spanish origin?: A. No, not of Hispanic, Latino/a, or Spanish origin What is your race?: A. White Do you need or want an interpreter to communicate with a doctor or health care staff?: 0. No  Patient's Response To:  Health Literacy and Transportation Is the patient able to respond to health literacy and transportation needs?: Yes Health Literacy - How often do you need to have someone help you when you read instructions,  pamphlets, or other written material from your doctor or pharmacy?: Never In the past 12 months, has lack of transportation kept you from medical appointments or from getting medications?: No In the past 12 months, has lack of transportation kept you from meetings, work, or from getting things needed for daily living?: No  Home Assistive Devices / Equipment Home Equipment: Agricultural Consultant (2 wheels), Rollator (4 wheels), Shower seat  Prior Device Use: Indicate devices/aids used by the patient prior to current illness, exacerbation or injury? Walker and stair lift  Current Functional Level Cognition  Orientation Level: Oriented X4    Extremity Assessment (includes Sensation/Coordination)  Upper Extremity Assessment: Right hand dominant RUE Deficits / Details: R TSA history RUE Coordination: decreased gross motor LUE Deficits / Details: needs L TSA per pt LUE Coordination: decreased gross motor  Lower Extremity Assessment: Generalized weakness    ADLs  Overall ADL's : Needs assistance/impaired Eating/Feeding: Independent, Sitting Grooming: Set up, Sitting Upper Body Bathing: Set up, Sitting Lower Body Bathing: Moderate assistance Lower Body Bathing Details (indicate cue type and reason): Min A sit<>stand Upper Body Dressing : Set up, Sitting Lower Body Dressing: Moderate assistance Lower Body Dressing Details (indicate cue type and reason): Min A sit<>stand Toilet Transfer: Minimal assistance, +2 for safety/equipment, Rolling walker (2 wheels) Toilet Transfer Details (indicate cue type and reason): simulated bed>out and ambulating in hallway>back to recliner in room Toileting- Clothing Manipulation and Hygiene: Total assistance Toileting - Clothing Manipulation Details (indicate cue type and reason): min A sit<>stand General ADL Comments: Educated on purse lipped breathing (pt reports she cannot breath through her nose)    Mobility  Overal bed mobility: Needs Assistance Bed  Mobility: Rolling, Sidelying to Sit Rolling: Contact guard assist Sidelying to sit: Min assist, Mod assist Supine to sit: Contact guard, HOB elevated, Used rails General bed mobility comments: cues for log roll; significant assist to bring trunk to upright    Transfers  Overall transfer level: Needs assistance Equipment used: Rolling walker (2 wheels) Transfers: Sit to/from Stand Sit to Stand: Min assist, +2 safety/equipment General transfer comment: cues for LE management and use of UEs to self assist    Ambulation / Gait / Stairs / Wheelchair Mobility  Ambulation/Gait Ambulation/Gait assistance: Min assist, +2 safety/equipment Gait Distance (Feet): 58 Feet Assistive device: Rolling walker (2 wheels) Gait Pattern/deviations: Step-to pattern, Step-through pattern, Decreased step length - right, Decreased step length - left, Shuffle, Trunk flexed General Gait Details: cues for posture, position from RW and safety awareness; distance ltd by fatigue Gait velocity: decr  Posture / Balance Balance Overall balance assessment: Needs assistance Sitting-balance support: No upper extremity supported, Feet supported Sitting balance-Leahy Scale: Fair Standing balance support: Bilateral upper extremity supported, Reliant on assistive device for balance Standing balance-Leahy Scale: Poor Standing balance comment: reliant on RW and additional external A from therapist    Special considerations/life events  Continuous Drip IV  maxipime , flagyl , heparin  and Skin stage 1 pressure injury to buttocks, unstageable wound to Left pretibial area    Previous Home Environment (from acute therapy documentation) Living Arrangements: Spouse/significant other Available Help at Discharge: Family, Available 24 hours/day Type of Home: House Home Layout: Two level, Bed/bath upstairs Alternate Level Stairs-Rails: Right Alternate Level Stairs-Number of Steps: stair lift Home Access: Ramped entrance Bathroom  Shower/Tub: Health Visitor: Handicapped height Home Care Services: No Additional Comments: pt has rollator downstairs and RW upstairs  Discharge Living Setting Plans for Discharge Living Setting: Patient's home, Lives with (comment) (spouse, Sherry) Type of Home at Discharge: House Discharge Home Layout: Two level, Bed/bath upstairs Alternate Level Stairs-Rails:  (stair lift) Alternate Level Stairs-Number of Steps: n/a Discharge Home Access: Ramped entrance Discharge Bathroom Shower/Tub: Walk-in shower Discharge Bathroom Toilet: Handicapped height Discharge Bathroom Accessibility: Yes How Accessible: Accessible via walker Does the patient have any problems obtaining your medications?: No  Social/Family/Support Systems Patient Roles: Spouse Anticipated Caregiver: [name] Anticipated Caregiver's Contact Information: [phone] Ability/Limitations of Caregiver: none stated Caregiver Availability: 24/7 Discharge Plan Discussed with Primary Caregiver: Yes Is Caregiver In Agreement with Plan?: Yes Does Caregiver/Family have Issues with Lodging/Transportation while Pt is in Rehab?: No  Goals Patient/Family Goal for Rehab: PT/OT supervision to mod I, SLP n/a Expected length of stay: 7-10 days *** Additional Information: Discharge plan: discharge to pts home where she lives with her spouse who can provide 24/7 supervision up to min assist if needed Pt/Family Agrees to Admission and willing to participate: Yes Program Orientation Provided & Reviewed with Pt/Caregiver Including Roles  & Responsibilities: Yes  Decrease burden of Care through IP rehab admission: n/a  Possible need for SNF placement upon discharge: not anticipated.  Plan for d/c home with spouse who can provide 24/7 supervision/assist.   Patient Condition: I have reviewed medical records from [facility], spoken with CSW, and patient and spouse. I discussed via phone for inpatient rehabilitation  assessment.  Patient will benefit from ongoing PT and OT, can actively participate in 3 hours of therapy a day 5 days of the week, and can make measurable gains during the admission.  Patient will also benefit from the coordinated team approach during an Inpatient Acute Rehabilitation admission.  The patient will receive intensive therapy as well as Rehabilitation physician, nursing, social worker, and care management interventions.  Due to safety, skin/wound care, disease management, medication administration, pain management, and patient education the patient requires 24 hour a day rehabilitation nursing.  The patient is currently *** with mobility and basic ADLs.  Discharge setting and therapy post discharge at home with home health is anticipated.  Patient has agreed to participate in the Acute Inpatient Rehabilitation Program and will admit ***.  Preadmission Screen Completed By:  Reche FORBES Lowers, 01/19/2025 11:23 AM ______________________________________________________________________   Discussed status with Dr. PIERRETTE on *** at *** and received approval for admission today.  Admission Coordinator:  Bravery Ketcham E Salbador Fiveash, PT, time PIERRETTEPattricia ***   Assessment/Plan: Diagnosis: *** Does the need for close, 24 hr/day Medical supervision in concert with the patient's rehab needs make it unreasonable for this patient to be  served in a less intensive setting? {yes_no_potentially:3041433} Co-Morbidities requiring supervision/potential complications: *** Due to {due un:6958565}, does the patient require 24 hr/day rehab nursing? {yes_no_potentially:3041433} Does the patient require coordinated care of a physician, rehab nurse, PT, OT, and SLP to address physical and functional deficits in the context of the above medical diagnosis(es)? {yes_no_potentially:3041433} Addressing deficits in the following areas: {deficits:3041436} Can the patient actively participate in an intensive therapy program of at least 3 hrs of  therapy 5 days a week? {yes_no_potentially:3041433} The potential for patient to make measurable gains while on inpatient rehab is {potential:3041437} Anticipated functional outcomes upon discharge from inpatient rehab: {functional outcomes:304600100} PT, {functional outcomes:304600100} OT, {functional outcomes:304600100} SLP Estimated rehab length of stay to reach the above functional goals is: *** Anticipated discharge destination: {anticipated dc setting:21604} 10. Overall Rehab/Functional Prognosis: {potential:3041437}   MD Signature: ***    [1]  Current Facility-Administered Medications:    acetaminophen  (TYLENOL ) tablet 650 mg, 650 mg, Oral, Q6H WA, 650 mg at 01/19/25 0742 **OR** [DISCONTINUED] acetaminophen  (TYLENOL ) suppository 650 mg, 650 mg, Rectal, Q6H PRN, Krugh, Marissa C, DO   arformoterol  (BROVANA ) nebulizer solution 15 mcg, 15 mcg, Nebulization, BID, 15 mcg at 01/18/25 2111 **AND** umeclidinium bromide  (INCRUSE ELLIPTA ) 62.5 MCG/ACT 1 puff, 1 puff, Inhalation, Daily, Krugh, Marissa C, DO, 1 puff at 01/17/25 0711   ceFEPIme  (MAXIPIME ) 2 g in sodium chloride  0.9 % 100 mL IVPB, 2 g, Intravenous, Q12H, Gonfa, Taye T, MD, Last Rate: 200 mL/hr at 01/19/25 0509, 2 g at 01/19/25 0509   Chlorhexidine  Gluconate Cloth 2 % PADS 6 each, 6 each, Topical, Daily, Gonfa, Taye T, MD, 6 each at 01/18/25 1355   furosemide  (LASIX ) injection 40 mg, 40 mg, Intravenous, BID, Azobou Tonleu, Joelle DEL, MD, 40 mg at 01/19/25 9257   gabapentin  (NEURONTIN ) capsule 300 mg, 300 mg, Oral, Daily, Krugh, Marissa C, DO, 300 mg at 01/18/25 0834   heparin  ADULT infusion 100 units/mL (25000 units/250mL), 850 Units/hr, Intravenous, Continuous, Gonfa, Taye T, MD, Last Rate: 8.5 mL/hr at 01/19/25 0304, 850 Units/hr at 01/19/25 0304   ipratropium-albuterol  (DUONEB) 0.5-2.5 (3) MG/3ML nebulizer solution 3 mL, 3 mL, Nebulization, Q3H PRN, Gonfa, Taye T, MD   lactose free nutrition (BOOST PLUS) liquid 237 mL, 237 mL, Oral,  BID BM, Gonfa, Taye T, MD, 237 mL at 01/18/25 1401   lip balm (CARMEX) ointment 1 Application, 1 Application, Topical, PRN, Gonfa, Taye T, MD   metoprolol  tartrate (LOPRESSOR ) tablet 75 mg, 75 mg, Oral, BID, Azobou Tonleu, Joelle DEL, MD, 75 mg at 01/18/25 2105   metroNIDAZOLE  (FLAGYL ) IVPB 500 mg, 500 mg, Intravenous, Q12H, Lynwood Setter, RPH, Last Rate: 100 mL/hr at 01/19/25 0559, 500 mg at 01/19/25 0559   morphine  (PF) 2 MG/ML injection 2 mg, 2 mg, Intravenous, Q4H PRN, Gonfa, Taye T, MD, 2 mg at 01/19/25 0741   ondansetron  (ZOFRAN ) tablet 4 mg, 4 mg, Oral, Q6H PRN **OR** ondansetron  (ZOFRAN ) injection 4 mg, 4 mg, Intravenous, Q6H PRN, Krugh, Marissa C, DO, 4 mg at 01/19/25 9077   Oral care mouth rinse, 15 mL, Mouth Rinse, PRN, Gonfa, Taye T, MD   oxyCODONE  (Oxy IR/ROXICODONE ) immediate release tablet 5 mg, 5 mg, Oral, Q8H PRN, Gonfa, Taye T, MD, 5 mg at 01/19/25 0509   pantoprazole  (PROTONIX ) injection 40 mg, 40 mg, Intravenous, BID, Kriss Stagger H, DO, 40 mg at 01/19/25 0906   phosphorus (K PHOS  NEUTRAL) tablet 500 mg, 500 mg, Oral, Q4H, Gonfa, Taye T, MD   polyethylene glycol (MIRALAX  / GLYCOLAX )  packet 17 g, 17 g, Oral, BID, Gonfa, Taye T, MD, 17 g at 01/17/25 9076   predniSONE  (DELTASONE ) tablet 5 mg, 5 mg, Oral, Q breakfast, Gonfa, Taye T, MD, 5 mg at 01/19/25 9257   sodium bicarbonate  tablet 650 mg, 650 mg, Oral, TID, Gonfa, Taye T, MD   sodium chloride  flush (NS) 0.9 % injection 10-40 mL, 10-40 mL, Intracatheter, Q12H, Gonfa, Taye T, MD, 10 mL at 01/19/25 0936   sodium chloride  flush (NS) 0.9 % injection 5 mL, 5 mL, Intracatheter, Q8H, Suttle, Dylan J, MD, 5 mL at 01/19/25 0559  Facility-Administered Medications Ordered in Other Encounters:    acetaminophen  (TYLENOL ) 325 MG tablet, , , ,    diphenhydrAMINE  (BENADRYL ) 25 mg capsule, , , ,

## 2025-01-19 NOTE — Progress Notes (Addendum)
 "  Progress Note  Patient Name: [name] Date of Encounter: 01/19/2025 [facility] Cardiologist: Madonna Large, DO   Interval Summary   Denies any chest pain or orthopnea. Continues to have 4/10 RLQ abdominal pain.   Vital Signs Vitals:   01/18/25 2007 01/18/25 2105 01/18/25 2111 01/19/25 0407  BP: 117/63 117/63  (!) 99/51  Pulse: 86 86  79  Resp: 18   14  Temp: 98.9 F (37.2 C)   98 F (36.7 C)  TempSrc:      SpO2: 95%  92% 92%  Weight:      Height:        Intake/Output Summary (Last 24 hours) at 01/19/2025 1157 Last data filed at 01/19/2025 0900 Gross per 24 hour  Intake 696.69 ml  Output 360 ml  Net 336.69 ml      01/13/2025    7:36 PM 01/12/2025    9:08 PM 12/22/2024    1:36 PM  Last 3 Weights  Weight (lbs) 105 lb 2.6 oz 105 lb 9.6 oz 105 lb 8 oz  Weight (kg) 47.7 kg 47.9 kg 47.854 kg      Telemetry/ECG  NSR 70-80 - Personally Reviewed  Physical Exam  GEN: No acute distress.  Alert and orientated on room air. Neck: no JVD Cardiac: RRR, no murmurs, rubs, or gallops.  Respiratory: Crackles present . GI: Soft, nontender, non-distended  MS: trace+ bilateral lower extremity edema  Assessment & Plan   [name] is a 79 y.o. 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     Acute appendicitis Periappendiceal abscess Patient presented to the hospital for concerns of nausea and vomiting.   CT abdomen and pelvis was done and indicated that the patient had acute appendicitis with a periappendiceal abscess.   Surgery was consulted.   Surgery is currently favoring a conservative management approach but is continuing to follow the patient.   IR successfully placed a drain in the patient's abscess on 01/15/25.   Leukocytosis has  worsened to 117.6    Longstanding persistent/permanent atrial flutter CHA2DS2-VASc Score = 6 [CHF History: 1, HTN History: 1, Diabetes History: 0, Stroke History: 0, Vascular Disease History: 1, Age Score: 2, Gender Score: 1].  Therefore, the patient's annual risk of stroke is 9.7 %.    Patient was initially diagnosed with atrial fibrillation back in 2023.    Reported that her heart rate typically runs in the 110s and 120s at home.   Prior to admission the patient was on Eliquis  5 mg twice daily, metoprolol  titrate 75 mg twice daily, and Cardizem  360 mg daily.   Discussed with patient and she prefers a rate control strategy over a rhythm control strategy.   Suspect that the patient's increased heart rates are secondary to acute appendicitis.   Was initially placed on IV amiodarone  but this was held.   Home metoprolol  75 mg twice daily was recently started.   Continue to monitor patient's blood pressure and increase her AV nodal agents back to home dose if able to tolerate.  Limited echo yesterday showed an LVEF of 55 to 60%. Continue metoprolol  tartrate 75 mg twice daily Continue IV heparin . May consider transitioning back to eliquis  when no more surgeries or procedures are planned.    History of cardiomyopathy Small pericardial  effusion Does not appear like patient has previously had an ischemic evaluation. An echocardiogram was done on 03/2022 and showed a moderately reduced LVEF of 40 to 45%. Denies symptoms of heart failure such as shortness of breath, orthopnea, lower extremity edema, PND. CT abdomen and pelvis showed  small bilateral pleural effusions, cardiomegaly with mitral valve calcifications, and systemic atherosclerosis. Limited echo on 01/18/2025 showed small pericardial effusion and large left pleural effusion.  Patient is under consideration for thoracentesis.  Was started on IV Lasix  40 mg twice daily.  Creatinine this morning is worsened to 1.64. I&O are net in 2L but appear  incomplete.  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  I personally taken a history, examined the patient, reviewed relevant notes,  laboratory data / imaging studies.  I performed a substantive portion of this encounter and formulated the important aspects of the plan.  I agree with the APP's note, impression, and recommendations; however, I have edited the note to reflect changes or salient points.   Patient seen and examined at bedside. Accompanied by her husband. No cardiac symptoms. Mildly upset due to nausea and vomiting earlier this morning which prevented some the medications being withheld.  PHYSICAL EXAM: Today's Vitals   01/19/25 0554 01/19/25 0741 01/19/25 0811 01/19/25 1154  BP:      Pulse:      Resp:      Temp:      TempSrc:      SpO2:      Weight:      Height:      PainSc: Asleep 6  2  3     Body mass index is 19.87 kg/m.   Net IO Since Admission: 2,200.54 mL [01/19/25 1157]  Filed Weights   01/12/25 2108 01/13/25 1936  Weight: 47.9 kg 47.7 kg    General: Appears older than stated age, hemodynamically stable, no acute distress HEENT: Normocephalic, atraumatic, trachea midline, no JVP Lungs: Decreased breath sounds bilaterally at the bases and rales mid lung fields  Heart: Regular positive S1-S2, no murmurs rubs or gallops appreciated. Abdomen: Soft, nontender, nondistended, JP drain present Extremities: +1 edema, warm to touch Neuro: Nonfocal, moves all 4 extremities Psych: Cooperative  EKG: (personally reviewed by me) No new  tracing  Telemetry: (personally reviewed by me) Currently sinus with intermittent A-fib   Impression & Recommendations: :  Persistent atrial fibrillation/flutter -currently sinus Long-term oral anticoagulation. Pericardial effusion. Pleural effusion Hyperlipidemia Acute appendicitis with periappendiceal abscess  Cardiology consulted during his hospitalization for atrial fibrillation management as she presents with GI symptoms and diagnosed with acute appendicitis.  Her atrial fibrillation with rapid ventricular rate was likely physiological in the setting of acute appendicitis and periappendiceal abscess.  Patient is on AV nodal blocking agents at home as well as anticoagulation for thromboembolic prophylaxis.  Patient required IV amiodarone  for short period of time given her RVR.  Patient is currently on Lopressor  75 mg p.o. twice daily. Can restart Cardizem   once blood pressures are better controlled and work her way up to  her home dose. Currently on IV heparin  drip can transition to oral once stable and no longer needs interventions.  Echocardiogram noted pericardial effusion which has improved in size.  No tamponade physiology based on the recent echocardiogram.  Pleural effusion noted on prior echocardiogram.  Will defer management to primary team.  Cardiology will sign off for now please reach out if any questions or concerns arise.  Medical Decision:  Complexity: High Interdisciplinary: Yes, spoke to nursing staff Independently reviewed: Vitals, strict I's and O's, labs, telemetry, echocardiogram.s during his hospitalization Prescription drug management -recommendations stated above Family updated: Has been at bedside  Further recommendations to follow as the case evolves.   This note was created using a voice recognition software as a result there may be grammatical errors inadvertently enclosed that do not reflect the nature of this encounter. Every attempt is made to  correct such errors.   Madonna Large, DO, [facility]  A Division of [facility] [address], [place], [place] [address]  01/19/2025 11:57 AM    Addendum:  Attending and RN reached out this afternoon due to concern for Afib RVR and hypotension  I would not be surprised if she is in A-fib with RVR given her known history of underlying persistent A-fib, acute appendicitis with appendical abscess, white count 117K.  I would be more concerned for possible sepsis given her immunocompromise status. Limited echocardiogram yesterday continues to show large left pericardial effusion.  Recommended getting an EKG. Patient has been on IV amiodarone  we will start her on oral amiodarone  200 mg twice daily for the next 14 days and then can discontinue or reevaluate based on clinical trajectory Continue AV nodal blocking agents. Continue telemetry. Earlier I had signed off as she was in sinus rhythm but will continue to follow her tomorrow. Plan of care discussed with attending physician  Dr. Large 4:21 PM  EKG notes Afib with RVR with prolong QT  Will ask pharmacy to review medications and make sure we change them to alternative (if possible).  Monitor electrolytes keep Mg at 2 and potassium at 4.   Dr. Large 4:45 PM  "

## 2025-01-19 NOTE — Progress Notes (Signed)
 PHARAMACY - ANTICOAGULATION CONSULT NOTE  Pharmacy Consult for IV heparin  Indication: atrial fibrillation  Allergies[1]  Patient Measurements: Height: 5' 1 (154.9 cm) Weight: 47.7 kg (105 lb 2.6 oz) IBW/kg (Calculated) : 47.8 HEPARIN  DW (KG): 47.7  Vital Signs: Temp: 98.1 F (36.7 C) (01/26 1209) BP: 103/55 (01/26 1209) Pulse Rate: 90 (01/26 1209)  Labs: Recent Labs    01/17/25 0649 01/18/25 0448 01/18/25 1612 01/19/25 0159 01/19/25 0530 01/19/25 1121  HGB 8.6* 9.4*  --   --  9.0*  --   HCT 26.8* 30.5*  --   --  27.9*  --   PLT 174 202  --   --  227  --   APTT  --   --  66*  --   --   --   HEPARINUNFRC  --   --  0.14* 0.20*  --  0.58  CREATININE 1.02* 1.19*  --   --  1.64*  --     Estimated Creatinine Clearance: 21.3 mL/min (A) (by C-G formula based on SCr of 1.64 mg/dL (H)).   Assessment: 64 yoF with PMH Afib on PTA Eliquis , 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 Found to have acute appendicitis with periappendiceal abscess and possible choledocholithiasis. FOBT+. Also noted to be in Afib-RVR on admission. Had periappendiceal abscess drain placed 1/22; current plan is for non-surgical management d/t multiple comorbidities making patient extremely high-risk for surgery. On 1/25, Pharmacy consulted to resume heparin   Baseline INR slightly elevated (as expected w/ DOAC); aPTT WNL Prior anticoagulation: Eliquis  5 mg BID, last dose 1/18 at noon  Significant events:  Today, 01/19/2025: Heparin  level drawn at 11:21 am = 0.58 is therapeutic after heparin  increased to 850 units/hr CBC stable  No bleeding noted per RN  Limited echo on 01/18/2025 showed small pericardial effusion and large left pleural effusion.   Goal of Therapy: Heparin  level 0.3-0.7 units/ml; initially would aim for lower end of goal (0.3-0.5) given recent LGIB Monitor platelets by anticoagulation protocol: Yes  Plan: Continue heparin  drip @   850 units/hr IV infusion Daily CBC and daily heparin  level  Monitor for signs of bleeding or thrombosis F/u for ability to resume PTA Eliquis  once no longer a possibility of thoracentesis   Rosaline IVAR Edison, Pharm.D Use secure chat for questions 01/19/2025 12:53 PM           [1]  Allergies Allergen Reactions   Digoxin  And Related Rash and Other (See Comments)    Generalized total body rash with immense itching   Gluten Meal Diarrhea   Penicillins Rash and Other (See Comments)    Did it involve sudden or severe rash/hives, skin peeling, or any reaction on the inside of your mouth or nose? Yes When did it last happen? Many Years Ago      Fexofenadine Other (See Comments)    Dry mouth   Alendronate Rash   Hydroxychloroquine Rash

## 2025-01-19 NOTE — TOC Progression Note (Signed)
 Transition of Care [facility]) - Progression Note    Patient Details  Name: ITXEL WICKARD MRN: [id number] Date of Birth: 07-Apr-1946  Transition of Care [facility]) CM/SW Contact  Tawni CHRISTELLA Eva, LCSW Phone Number: 01/19/2025, 8:27 AM  Clinical Narrative:     Per chart review pt rec for AIR; being reviewed by CIR. ICM to follow for d/c needs.  Expected Discharge Plan:  (TBD) Barriers to Discharge: Continued Medical Work up               Expected Discharge Plan and Services In-house Referral: NA Discharge Planning Services: CM Consult Post Acute Care Choice: Durable Medical Equipment Living arrangements for the past 2 months: Single Family Home                 DME Arranged: N/A DME Agency: NA       HH Arranged: NA HH Agency: NA         Social Drivers of Health (SDOH) Interventions SDOH Screenings   Food Insecurity: No Food Insecurity (01/13/2025)  Housing: Low Risk (01/13/2025)  Recent Concern: Housing - At Risk (12/15/2024)  Transportation Needs: No Transportation Needs (01/13/2025)  Utilities: Not At Risk (01/13/2025)  Recent Concern: Utilities - At Risk (12/15/2024)  Depression (PHQ2-9): Low Risk (12/22/2024)  Social Connections: Unknown (01/13/2025)  Tobacco Use: Medium Risk (01/12/2025)    Readmission Risk Interventions    01/14/2025    3:31 PM 06/23/2022   11:27 AM  Readmission Risk Prevention Plan  Transportation Screening Complete Complete  PCP or Specialist Appt within 3-5 Days Complete   HRI or Home Care Consult Complete   Social Work Consult for Recovery Care Planning/Counseling Complete   Palliative Care Screening Not Applicable   Medication Review Oceanographer) Complete Complete  PCP or Specialist appointment within 3-5 days of discharge  Complete  HRI or Home Care Consult  Complete  SW Recovery Care/Counseling Consult  Complete  Palliative Care Screening  Not Applicable  Skilled Nursing Facility  Not Applicable

## 2025-01-19 NOTE — Progress Notes (Signed)
 PHARAMACY - ANTICOAGULATION CONSULT NOTE  Pharmacy Consult for IV heparin  Indication: atrial fibrillation  Allergies[1]  Patient Measurements: Height: 5' 1 (154.9 cm) Weight: 47.7 kg (105 lb 2.6 oz) IBW/kg (Calculated) : 47.8 HEPARIN  DW (KG): 47.7  Vital Signs: Temp: 98.9 F (37.2 C) (01/25 2007) BP: 117/63 (01/25 2105) Pulse Rate: 86 (01/25 2105)  Labs: Recent Labs    01/16/25 0815 01/17/25 0649 01/18/25 0448 01/18/25 1612 01/19/25 0159  HGB 9.7* 8.6* 9.4*  --   --   HCT 32.0* 26.8* 30.5*  --   --   PLT 172 174 202  --   --   APTT  --   --   --  66*  --   HEPARINUNFRC  --   --   --  0.14* 0.20*  CREATININE 0.99 1.02* 1.19*  --   --     Estimated Creatinine Clearance: 29.3 mL/min (A) (by C-G formula based on SCr of 1.19 mg/dL (H)).   Medical History: Past Medical History:  Diagnosis Date   Arthritis    Rheumatoid arthritis   Celiac disease    Chronic kidney disease    stage 3 from MD notes   COPD (chronic obstructive pulmonary disease) (HCC)    Dyspnea    with going up stairs   Family history of adverse reaction to anesthesia    father had hard time waking up   Headache    sinus headaches   Hot flashes    Hypertension    Iron deficiency anemia    Pneumonia    per patient I have walking pneumonia    Medications:  Medications Prior to Admission  Medication Sig Dispense Refill Last Dose/Taking   acetaminophen  (TYLENOL ) 500 MG tablet Take 2 tablets (1,000 mg total) by mouth 3 (three) times daily. (Patient taking differently: Take 1,000 mg by mouth daily at 12 noon.) 30 tablet 0 01/11/2025 Noon   apixaban  (ELIQUIS ) 5 MG TABS tablet TAKE 1 TABLET(5 MG) BY MOUTH TWICE DAILY 60 tablet 5 01/11/2025 at 12:00 PM   azaCITIDine  (VIDAZA ) 100 MG SUSR Inject 114 mg into the vein every 6 (six) weeks.   Past Month   CLARITIN  10 MG tablet Take 10 mg by mouth daily.   01/11/2025 Morning   denosumab  (PROLIA ) 60 MG/ML SOSY injection Inject 60 mg into the skin every 6  (six) months.   Unknown   diltiazem  (CARDIZEM  CD) 360 MG 24 hr capsule Take 1 capsule (360 mg total) by mouth daily. 90 capsule 0 01/11/2025 Morning   furosemide  (LASIX ) 20 MG tablet Take 20 mg by mouth in the morning.   01/11/2025 Morning   gabapentin  (NEURONTIN ) 300 MG capsule Take 300 mg by mouth in the morning.   01/11/2025 Noon   inFLIXimab  (REMICADE ) 100 MG injection Inject into the vein every 8 (eight) weeks.   Unknown   Metoprolol  Tartrate 75 MG TABS Take 2 tablets (150 mg total) by mouth 2 (two) times daily. 360 tablet 3 01/11/2025 Morning   ondansetron  (ZOFRAN ) 4 MG tablet Take 1 tablet (4 mg total) by mouth every 8 (eight) hours as needed for nausea or vomiting. 20 tablet 1 01/11/2025 Noon   predniSONE  (DELTASONE ) 5 MG tablet Take 5 mg by mouth daily with breakfast.   01/11/2025 Morning   Tiotropium Bromide-Olodaterol (STIOLTO RESPIMAT ) 2.5-2.5 MCG/ACT AERS Inhale 2 puffs into the lungs daily. 4 g 11 01/11/2025 Morning   furosemide  (LASIX ) 20 MG tablet Take 1 tablet (20 mg total) by mouth daily. (Patient not  taking: Reported on 01/12/2025) 30 tablet 11 Not Taking   nitrofurantoin , macrocrystal-monohydrate, (MACROBID ) 100 MG capsule Take 1 capsule (100 mg total) by mouth 2 (two) times daily. (Patient not taking: Reported on 01/12/2025) 14 capsule 0 Not Taking   Scheduled:   acetaminophen   650 mg Oral Q6H WA   arformoterol   15 mcg Nebulization BID   And   umeclidinium bromide   1 puff Inhalation Daily   Chlorhexidine  Gluconate Cloth  6 each Topical Daily   furosemide   40 mg Intravenous BID   gabapentin   300 mg Oral Daily   lactose free nutrition  237 mL Oral BID BM   metoprolol  tartrate  75 mg Oral BID   pantoprazole  (PROTONIX ) IV  40 mg Intravenous BID   polyethylene glycol  17 g Oral BID   predniSONE   5 mg Oral Q breakfast   sodium chloride  flush  10-40 mL Intracatheter Q12H   sodium chloride  flush  5 mL Intracatheter Q8H   PRN: ipratropium-albuterol , lip balm, morphine  injection,  ondansetron  **OR** ondansetron  (ZOFRAN ) IV, mouth rinse, oxyCODONE   Assessment: 54 yoF with PMH Afib on PTA Eliquis , 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 Found to have acute appendicitis with periappendiceal abscess and possible choledocholithiasis. FOBT+. Also noted to be in Afib-RVR on admission. Had periappendiceal abscess drain placed 1/22; current plan is for non-surgical management d/t multiple comorbidities making patient extremely high-risk for surgery. On 1/25, Pharmacy consulted to resume heparin   Baseline INR slightly elevated (as expected w/ DOAC); aPTT WNL Prior anticoagulation: Eliquis  5 mg BID, last dose 1/18 at noon  Significant events:  Today, 01/19/2025: Heparin  level 0.2 subtherapeutic on 750 units/hr No bleeding noted per RN   Goal of Therapy: Heparin  level 0.3-0.7 units/ml; initially would aim for lower end of goal (0.3-0.5) given recent LGIB Monitor platelets by anticoagulation protocol: Yes  Plan: Increase heparin  to 850 units/hr IV infusion; no bolus given recent GIB Check heparin  level   8 hrs after rate change  Daily CBC, daily heparin  level once stable Monitor for signs of bleeding or thrombosis   Leeroy Mace RPh 01/19/2025, 2:52 AM         [1]  Allergies Allergen Reactions   Digoxin  And Related Rash and Other (See Comments)    Generalized total body rash with immense itching   Gluten Meal Diarrhea   Penicillins Rash and Other (See Comments)    Did it involve sudden or severe rash/hives, skin peeling, or any reaction on the inside of your mouth or nose? Yes When did it last happen? Many Years Ago      Fexofenadine Other (See Comments)    Dry mouth   Alendronate Rash   Hydroxychloroquine Rash

## 2025-01-19 NOTE — Progress Notes (Signed)
 "                                                                                                                                                                                                          Daily Progress Note   Patient Name: [name]       Date: 01/19/2025 DOB: March 26, 1946  Age: 79 y.o. MRN#: [id number] Attending Physician: Kathrin Mignon DASEN, MD Primary Care Physician: [name], MD Admit Date: 01/12/2025  Reason for Follow-up: Establishing goals of care  Patient Profile/HPI:  78 y.o. 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  She is admitted with acute appendicitis with periappendiceal abscess, A-fib with RVR, and possible choledocholithiasis. On 1/22, she had IR drain placement in the periappendiceal abscess. Workup has also revealed pericardial effusion.    Palliative Medicine has been consulted for goals of care discussions and complex medical decision making.   Discussion: Chart reviewed-  Labs- BMET- significant hypocalcemia 6.5. Cr is up today at 1.64.     Component Value Date/Time   NA 136 01/19/2025 0530   NA 141 01/04/2016 1001   K 3.9 01/19/2025 0530   K 4.5 01/04/2016 1001   CL 107 01/19/2025 0530   CO2 17 (L) 01/19/2025 0530   CO2 24 01/04/2016 1001   GLUCOSE 80 01/19/2025 0530   GLUCOSE 105 01/04/2016 1001   BUN 18 01/19/2025 0530   BUN 21.7 01/04/2016 1001   CREATININE 1.64 (H) 01/19/2025 0530   CREATININE 1.48 (H) 12/15/2024 1057   CREATININE 1.2 (H) 01/04/2016 1001   CALCIUM  6.5 (L) 01/19/2025 0530   CALCIUM  9.7 01/04/2016 1001   EGFR 44 (L) 01/04/2016 1001   GFRNONAA 32 (L) 01/19/2025 0530   GFRNONAA 36 (L) 12/15/2024 1057   CBC with significant leucocytosis- 117.6  On evaluation she is awake and  alert. Tells me that she has been feeling confused, problems with word finding, feels like there's a constant buzzing in her head.  On exam she is oriented x 3, knows year and president. Craniofacial nerves in tact. Equal strength in upper and lower extremities. No slurring of speech. No arm drift of R arm. L arm movement is limited due to shoulder injury that is normal for her.  She also reports some twitching in her lower extremities and I observed her R foot/leg twitching  after strength testing.  I reviewed her labs with her- noted hypocalcemia- I discussed with her that this can cause issues with mental clarity and her muscle twitching. She recalled previous Palliative discussion regarding resuscitation. She continues to wish to remain full scope care and full code.  Per notes she is being evaluated for CIR.  Above discussed with Dr. Gonfa.   Review of Systems  Constitutional:  Positive for malaise/fatigue.  Neurological:  Negative for speech change and focal weakness.     Physical Exam Vitals and nursing note reviewed.  Eyes:     General: No visual field deficit. Cardiovascular:     Rate and Rhythm: Normal rate.  Pulmonary:     Effort: Pulmonary effort is normal.  Skin:    General: Skin is warm and dry.  Neurological:     Mental Status: She is alert.     GCS: GCS eye subscore is 4. GCS verbal subscore is 5. GCS motor subscore is 6.     Cranial Nerves: No cranial nerve deficit, dysarthria or facial asymmetry.             Vital Signs: BP (!) 103/55   Pulse 90   Temp 98.1 F (36.7 C)   Resp 14   Ht 5' 1 (1.549 m)   Wt 47.7 kg   SpO2 93%   BMI 19.87 kg/m  SpO2: SpO2: 93 % O2 Device: O2 Device: Room Air O2 Flow Rate: O2 Flow Rate (L/min): 0 L/min  Intake/output summary:  Intake/Output Summary (Last 24 hours) at 01/19/2025 1432 Last data filed at 01/19/2025 0900 Gross per 24 hour  Intake 696.69 ml  Output 360 ml  Net 336.69 ml   LBM: Last BM Date :  01/19/25 Baseline Weight: Weight: 47.9 kg Most recent weight: Weight: 47.7 kg    Patient Active Problem List   Diagnosis Date Noted   Current use of long term anticoagulation 01/19/2025   Pericardial effusion 01/19/2025   Hyperlipidemia 01/19/2025   Pleural effusion 01/19/2025   Atrial fibrillation/flutter (HCC) 01/19/2025   Advance care planning 01/16/2025   Pressure injury of skin 01/16/2025   Acute appendicitis with appendiceal abscess 01/13/2025   Appendicitis 01/12/2025   Lung nodules 04/23/2024   Hyponatremia 11/13/2023   Sacral pain 11/13/2023   Normocytic anemia 11/13/2023   Celiac disease 10/05/2022   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   Gastro-esophageal reflux disease without esophagitis 10/05/2022   Pure hypercholesterolemia 10/05/2022   Acute urinary retention 06/20/2022   Arterial vascular disease 06/20/2022   Atrial fibrillation with rapid ventricular response (HCC) 06/19/2022   Acute respiratory failure with hypoxia (HCC) 05/19/2022   Rheumatoid arthritis (HCC) 05/19/2022   Closed fracture of metatarsal bone of left foot 04/21/2022   Hematoma of left foot 04/08/2022   Protein-calorie malnutrition, moderate 04/06/2022   Chronic systolic CHF (congestive heart failure) (HCC) 04/05/2022   Dyspnea on exertion 04/05/2022   Hypokalemia 04/05/2022   Physical deconditioning 04/05/2022   Chronic kidney disease, stage 3a (HCC) 04/05/2022   Constipation 04/05/2022   Atrial fibrillation with slow ventricular response (HCC) 03/29/2022   Multifocal pneumonia 03/29/2022   Pancytopenia (HCC) 03/29/2022   Bilateral pleural effusion 03/29/2022   Port-A-Cath in place 02/06/2022   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   Stage 2 moderate COPD by GOLD classification (HCC) 05/03/2021   Hx of multiple pulmonary nodules 05/03/2021   S/P shoulder replacement, 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 Kathrin   Code Status:   Code Status: Full Code   Prognosis:  Unable to determine  Discharge Planning: To Be Determined  Care plan was discussed with patient and Dr. Gonfa.   Thank you for allowing the Palliative Medicine Team to assist in the care of this patient.  I personally spent a total of 45 minutes in the care of the patient today including preparing to see the patient, getting/reviewing separately obtained history, performing a medically appropriate exam/evaluation, counseling and educating, referring and communicating with other health care professionals, documenting clinical information in the EHR, independently interpreting results, and communicating results.   Cassondra Stain, AGNP-C Palliative Medicine   Please contact Palliative Medicine Team phone at [phone] for questions and concerns.        "

## 2025-01-19 NOTE — Progress Notes (Signed)
 Inpatient Rehab Coordinator Note:  I spoke with patient and her spouse over the phone to discuss CIR recommendations and goals/expectations of CIR stay.  We reviewed 3 hrs/day of therapy, physician follow up, and average length of stay 2 weeks (dependent upon progress) with goals of supervision.  Pt notes not been OOB since therapy session on 1/24 and therapy not assigned today so will see if they can stop in.  Also will discuss timing of potential readiness for transfer to rehab with MDs.  Pt has Medicare A/B which will cover CIR and does not require prior authorization.  I will review this with them next time we speak.    Reche Lowers, PT, DPT Admissions Coordinator [phone] 01/19/25 11:17 AM

## 2025-01-19 NOTE — Progress Notes (Signed)
 [facility] Liaison Note   This is a current home health PT patient with [facility]. We will follow for discharge disposition.   Please provide resumption of care orders for home health PT at discharge.     Please call with any questions or concerns.   Thank you, Eleanor Nail, [facility] Liaison [phone]

## 2025-01-20 ENCOUNTER — Inpatient Hospital Stay (HOSPITAL_COMMUNITY)

## 2025-01-20 DIAGNOSIS — Z7189 Other specified counseling: Secondary | ICD-10-CM | POA: Diagnosis not present

## 2025-01-20 DIAGNOSIS — I4892 Unspecified atrial flutter: Secondary | ICD-10-CM | POA: Diagnosis not present

## 2025-01-20 DIAGNOSIS — J690 Pneumonitis due to inhalation of food and vomit: Secondary | ICD-10-CM

## 2025-01-20 DIAGNOSIS — E785 Hyperlipidemia, unspecified: Secondary | ICD-10-CM | POA: Diagnosis not present

## 2025-01-20 DIAGNOSIS — Z7901 Long term (current) use of anticoagulants: Secondary | ICD-10-CM

## 2025-01-20 DIAGNOSIS — K3533 Acute appendicitis with perforation and localized peritonitis, with abscess: Secondary | ICD-10-CM | POA: Diagnosis not present

## 2025-01-20 DIAGNOSIS — E8809 Other disorders of plasma-protein metabolism, not elsewhere classified: Secondary | ICD-10-CM

## 2025-01-20 DIAGNOSIS — I3139 Other pericardial effusion (noninflammatory): Secondary | ICD-10-CM | POA: Diagnosis not present

## 2025-01-20 DIAGNOSIS — I4891 Unspecified atrial fibrillation: Secondary | ICD-10-CM | POA: Diagnosis not present

## 2025-01-20 DIAGNOSIS — J189 Pneumonia, unspecified organism: Secondary | ICD-10-CM

## 2025-01-20 DIAGNOSIS — J90 Pleural effusion, not elsewhere classified: Secondary | ICD-10-CM | POA: Diagnosis not present

## 2025-01-20 DIAGNOSIS — I480 Paroxysmal atrial fibrillation: Secondary | ICD-10-CM

## 2025-01-20 LAB — BLOOD GAS, VENOUS
Acid-base deficit: 11.4 mmol/L — ABNORMAL HIGH (ref 0.0–2.0)
Bicarbonate: 16 mmol/L — ABNORMAL LOW (ref 20.0–28.0)
O2 Saturation: 53.3 %
Patient temperature: 37.3
pCO2, Ven: 42 mmHg — ABNORMAL LOW (ref 44–60)
pH, Ven: 7.2 — ABNORMAL LOW (ref 7.25–7.43)
pO2, Ven: 37 mmHg (ref 32–45)

## 2025-01-20 LAB — COMPREHENSIVE METABOLIC PANEL WITH GFR
ALT: 12 U/L (ref 0–44)
AST: 32 U/L (ref 15–41)
Albumin: 2.8 g/dL — ABNORMAL LOW (ref 3.5–5.0)
Alkaline Phosphatase: 120 U/L (ref 38–126)
Anion gap: 15 (ref 5–15)
BUN: 22 mg/dL (ref 8–23)
CO2: 17 mmol/L — ABNORMAL LOW (ref 22–32)
Calcium: 6.7 mg/dL — ABNORMAL LOW (ref 8.9–10.3)
Chloride: 104 mmol/L (ref 98–111)
Creatinine, Ser: 2.31 mg/dL — ABNORMAL HIGH (ref 0.44–1.00)
GFR, Estimated: 21 mL/min — ABNORMAL LOW
Glucose, Bld: 73 mg/dL (ref 70–99)
Potassium: 4 mmol/L (ref 3.5–5.1)
Sodium: 135 mmol/L (ref 135–145)
Total Bilirubin: 0.3 mg/dL (ref 0.0–1.2)
Total Protein: 4.9 g/dL — ABNORMAL LOW (ref 6.5–8.1)

## 2025-01-20 LAB — AMMONIA: Ammonia: 77 umol/L — ABNORMAL HIGH (ref 9–35)

## 2025-01-20 LAB — TSH: TSH: 3.54 u[IU]/mL (ref 0.350–4.500)

## 2025-01-20 LAB — CBC WITH DIFFERENTIAL/PLATELET
Abs Immature Granulocytes: 22.79 10*3/uL — ABNORMAL HIGH (ref 0.00–0.07)
Basophils Absolute: 0.7 10*3/uL — ABNORMAL HIGH (ref 0.0–0.1)
Basophils Relative: 1 %
Eosinophils Absolute: 2.5 10*3/uL — ABNORMAL HIGH (ref 0.0–0.5)
Eosinophils Relative: 2 %
HCT: 28.1 % — ABNORMAL LOW (ref 36.0–46.0)
Hemoglobin: 8.8 g/dL — ABNORMAL LOW (ref 12.0–15.0)
Immature Granulocytes: 18 %
Lymphocytes Relative: 6 %
Lymphs Abs: 7.5 10*3/uL — ABNORMAL HIGH (ref 0.7–4.0)
MCH: 33.1 pg (ref 26.0–34.0)
MCHC: 31.3 g/dL (ref 30.0–36.0)
MCV: 105.6 fL — ABNORMAL HIGH (ref 80.0–100.0)
Monocytes Absolute: 39.2 10*3/uL — ABNORMAL HIGH (ref 0.1–1.0)
Monocytes Relative: 31 %
Neutro Abs: 56 10*3/uL — ABNORMAL HIGH (ref 1.7–7.7)
Neutrophils Relative %: 42 %
Platelets: 251 10*3/uL (ref 150–400)
RBC: 2.66 MIL/uL — ABNORMAL LOW (ref 3.87–5.11)
RDW: 19 % — ABNORMAL HIGH (ref 11.5–15.5)
WBC: 128.6 10*3/uL (ref 4.0–10.5)
nRBC: 0.2 % (ref 0.0–0.2)

## 2025-01-20 LAB — PTH, INTACT AND CALCIUM
Calcium, Total (PTH): 6.8 mg/dL — CL (ref 8.7–10.3)
PTH: 124 pg/mL — ABNORMAL HIGH (ref 15–65)

## 2025-01-20 LAB — TECHNOLOGIST SMEAR REVIEW
Clinical Information: ELEVATED
Plt Morphology: NORMAL

## 2025-01-20 LAB — HEPARIN LEVEL (UNFRACTIONATED): Heparin Unfractionated: 0.51 [IU]/mL (ref 0.30–0.70)

## 2025-01-20 LAB — MAGNESIUM: Magnesium: 1.9 mg/dL (ref 1.7–2.4)

## 2025-01-20 LAB — VITAMIN B12: Vitamin B-12: >4000 pg/mL — ABNORMAL HIGH (ref 180–914)

## 2025-01-20 LAB — PRO BRAIN NATRIURETIC PEPTIDE: Pro Brain Natriuretic Peptide: 30644 pg/mL — ABNORMAL HIGH

## 2025-01-20 LAB — PROCALCITONIN: Procalcitonin: 1.8 ng/mL

## 2025-01-20 MED ORDER — FUROSEMIDE 10 MG/ML IJ SOLN
60.0000 mg | Freq: Once | INTRAMUSCULAR | Status: AC
  Administered 2025-01-20: 60 mg via INTRAVENOUS
  Filled 2025-01-20: qty 6

## 2025-01-20 MED ORDER — LORAZEPAM 1 MG PO TABS: 1.0000 mg | ORAL_TABLET | ORAL | Status: DC | PRN

## 2025-01-20 MED ORDER — GLYCOPYRROLATE 1 MG PO TABS: 1.0000 mg | ORAL_TABLET | ORAL | Status: DC | PRN

## 2025-01-20 MED ORDER — CALCIUM GLUCONATE-NACL 2-0.675 GM/100ML-% IV SOLN
2.0000 g | Freq: Four times a day (QID) | INTRAVENOUS | Status: DC
  Administered 2025-01-20: 2000 mg via INTRAVENOUS
  Filled 2025-01-20 (×2): qty 100

## 2025-01-20 MED ORDER — HALOPERIDOL LACTATE 5 MG/ML IJ SOLN: 0.5000 mg | INTRAMUSCULAR | Status: DC | PRN

## 2025-01-20 MED ORDER — HALOPERIDOL 0.5 MG PO TABS: 0.5000 mg | ORAL_TABLET | ORAL | Status: DC | PRN

## 2025-01-20 MED ORDER — FUROSEMIDE 10 MG/ML IJ SOLN: 40.0000 mg | Freq: Two times a day (BID) | INTRAMUSCULAR | Status: DC

## 2025-01-20 MED ORDER — LORAZEPAM 2 MG/ML IJ SOLN: 1.0000 mg | INTRAMUSCULAR | Status: DC | PRN

## 2025-01-20 MED ORDER — GLYCOPYRROLATE 0.2 MG/ML IJ SOLN: 0.2000 mg | INTRAMUSCULAR | Status: DC | PRN

## 2025-01-20 MED ORDER — POLYVINYL ALCOHOL 1.4 % OP SOLN
1.0000 [drp] | Freq: Four times a day (QID) | OPHTHALMIC | Status: DC | PRN
  Administered 2025-01-21: 1 [drp] via OPHTHALMIC
  Filled 2025-01-20: qty 15

## 2025-01-20 MED ORDER — LORAZEPAM 2 MG/ML IJ SOLN
1.0000 mg | Freq: Four times a day (QID) | INTRAMUSCULAR | Status: DC
  Administered 2025-01-20 – 2025-01-21 (×2): 1 mg via INTRAVENOUS
  Filled 2025-01-20 (×2): qty 1

## 2025-01-20 MED ORDER — HYDROMORPHONE HCL 1 MG/ML IJ SOLN
0.5000 mg | INTRAMUSCULAR | Status: DC | PRN
  Administered 2025-01-21: 0.5 mg via INTRAVENOUS
  Filled 2025-01-20: qty 0.5

## 2025-01-20 MED ORDER — LORAZEPAM 2 MG/ML PO CONC: 1.0000 mg | ORAL | Status: DC | PRN

## 2025-01-20 MED ORDER — LACTULOSE 10 GM/15ML PO SOLN
20.0000 g | Freq: Three times a day (TID) | ORAL | Status: DC
  Filled 2025-01-20: qty 30

## 2025-01-20 MED ORDER — ACETAMINOPHEN 650 MG RE SUPP: 650.0000 mg | Freq: Four times a day (QID) | RECTAL | Status: DC | PRN

## 2025-01-20 MED ORDER — HYDROMORPHONE HCL 1 MG/ML IJ SOLN
0.2500 mg | INTRAMUSCULAR | Status: DC | PRN
  Administered 2025-01-20: 0.25 mg via INTRAVENOUS
  Filled 2025-01-20: qty 0.5

## 2025-01-20 MED ORDER — HALOPERIDOL LACTATE 2 MG/ML PO CONC: 0.5000 mg | ORAL | Status: DC | PRN

## 2025-01-20 MED ORDER — ALBUMIN HUMAN 25 % IV SOLN
12.5000 g | Freq: Once | INTRAVENOUS | Status: DC
  Filled 2025-01-20: qty 50

## 2025-01-20 MED ORDER — LORAZEPAM 2 MG/ML IJ SOLN: 1.0000 mg | Freq: Four times a day (QID) | INTRAMUSCULAR | Status: DC

## 2025-01-20 MED ORDER — LORAZEPAM 2 MG/ML IJ SOLN
1.0000 mg | INTRAMUSCULAR | Status: AC
  Administered 2025-01-20: 1 mg via INTRAVENOUS
  Filled 2025-01-20: qty 1

## 2025-01-20 MED ORDER — ALBUMIN HUMAN 25 % IV SOLN
12.5000 g | Freq: Once | INTRAVENOUS | Status: AC
  Administered 2025-01-20: 12.5 g via INTRAVENOUS
  Filled 2025-01-20: qty 50

## 2025-01-20 MED ORDER — APIXABAN 2.5 MG PO TABS
2.5000 mg | ORAL_TABLET | Freq: Two times a day (BID) | ORAL | Status: DC
  Filled 2025-01-20: qty 1

## 2025-01-20 MED ORDER — ACETAMINOPHEN 325 MG PO TABS: 650.0000 mg | ORAL_TABLET | Freq: Four times a day (QID) | ORAL | Status: DC | PRN

## 2025-01-20 NOTE — Progress Notes (Signed)
 Physical Therapy Treatment Patient Details Name: [name] MRN: [id number] DOB: [birth year] Today's Date: 01/20/2025   History of Present Illness [name] is a 79 y.o. female who presented to the ED due to cramping RLQ abdominal pain with associated abdominal distension, black stools, nausea, vomiting that started yesterday. Found to have periappendiceal abscess with IR drain placed, not a candidate for sx due to co -morbidities. PHMx: RA, CMML on chemotherapy, afib,   R TSA needs L TSA, Chronic A-fib,COPD, celiac dz, CKD III, HTN, anemia, T10 paraspinal soft tissue mass, R pleural metastatic nodule    PT Comments   Pt admitted with above diagnosis.  Pt currently with functional limitations due to the deficits listed below (see PT Problem List). Pt in bed when PT arrived. Pt apparently agreeable to therapy intervention. Pt limiting verbal communication and increased time for appropriate response, pt exhibits a significant functional decline from time of eval. Pt required total A x 2 for supine <> sit with bed pad, mod to min A for sitting balance EOB and strong cues for attention and pursed lip breathing. Pt difficulty to assess for o2 saturation due to involuntary mm jerking movements, obtained with dynamap and 85% on 5 L /min and nurse made aware. Pt left in bed all needs in place. Patient will benefit from continued inpatient follow up therapy, <3 hours/day. Pt will benefit from acute skilled PT to increase their independence and safety with mobility to allow discharge.      If plan is discharge home, recommend the following: Assistance with cooking/housework;Assist for transportation;Help with stairs or ramp for entrance;Two people to help with walking and/or transfers;Two people to help with bathing/dressing/bathroom   Can travel by private vehicle        Equipment Recommendations  None recommended by PT    Recommendations for Other Services       Precautions / Restrictions  Precautions Precautions: Fall Precaution/Restrictions Comments: monitor O2 Restrictions Weight Bearing Restrictions Per Provider Order: No     Mobility  Bed Mobility Overal bed mobility: Needs Assistance Bed Mobility: Supine to Sit, Sit to Supine     Supine to sit: Total assist, +2 for physical assistance, +2 for safety/equipment, HOB elevated Sit to supine: Total assist, +2 for physical assistance, +2 for safety/equipment   General bed mobility comments: no apparent intiaition for supine to sit, use of bed pad for safety and abdominal precuations for comfort, once in sitting pt required mod A for initial sitting balance with posterior and R lateral lean    Transfers                   General transfer comment: NT    Ambulation/Gait               General Gait Details: NT   Stairs             Wheelchair Mobility     Tilt Bed    Modified Rankin (Stroke Patients Only)       Balance Overall balance assessment: Needs assistance Sitting-balance support: Feet supported Sitting balance-Leahy Scale: Poor Sitting balance - Comments: mod to min A for static sitting balance       Standing balance comment: NT                            Communication Communication Communication: Impaired Factors Affecting Communication: Difficulty expressing self;Reduced clarity of speech  Cognition Arousal: Alert Behavior  During Therapy: Flat affect   PT - Cognitive impairments: Attention, Initiation                       PT - Cognition Comments: limtied verbal communication with pt able to state first name with increased time report last name and responded with yes to pain and with increased time and repeated questioning pt able to state abdominal Following commands: Intact      Cueing Cueing Techniques: Verbal cues, Tactile cues, Gestural cues, Visual cues  Exercises      General Comments General comments (skin integrity, edema, etc.):  5 L/min and pt desaturated with assisted bed mobiltiy and sitting EOB to 85% cues for pursed lip breathing, absent return demonstration, pt noted to have L distal medial wound covered with bandage, B proximal edema and trunkal edema, pt limited with verbal responses      Pertinent Vitals/Pain Pain Assessment Pain Assessment: Faces Faces Pain Scale: Hurts little more Pain Location: abdomen Pain Descriptors / Indicators: Aching, Cramping Pain Intervention(s): Limited activity within patient's tolerance, Monitored during session    Home Living                          Prior Function            PT Goals (current goals can now be found in the care plan section) Acute Rehab PT Goals Patient Stated Goal: Regain IND PT Goal Formulation: With patient Time For Goal Achievement: 01/31/25 Potential to Achieve Goals: Good Progress towards PT goals: Not progressing toward goals - comment    Frequency    Min 2X/week      PT Plan      Co-evaluation              AM-PAC PT 6 Clicks Mobility   Outcome Measure  Help needed turning from your back to your side while in a flat bed without using bedrails?: Total Help needed moving from lying on your back to sitting on the side of a flat bed without using bedrails?: Total Help needed moving to and from a bed to a chair (including a wheelchair)?: Total Help needed standing up from a chair using your arms (e.g., wheelchair or bedside chair)?: Total Help needed to walk in hospital room?: Total   6 Click Score: 5    End of Session Equipment Utilized During Treatment: Gait belt;Oxygen Activity Tolerance: Patient tolerated treatment well;Patient limited by fatigue Patient left: in chair;with call bell/phone within reach;with chair alarm set Nurse Communication: Mobility status;Other (comment) (O2 findings) PT Visit Diagnosis: Unsteadiness on feet (R26.81);Muscle weakness (generalized) (M62.81);Difficulty in walking, not  elsewhere classified (R26.2)     Time: 8951-8892 PT Time Calculation (min) (ACUTE ONLY): 19 min  Charges:    $Therapeutic Activity: 8-22 mins PT General Charges $$ ACUTE PT VISIT: 1 Visit                     [place], PT Acute Rehab    [place] VEAR Drone 01/20/2025, 1:16 PM

## 2025-01-20 NOTE — Progress Notes (Addendum)
 "  Progress Note  Patient Name: [name] Date of Encounter: 01/20/2025 [facility] Cardiologist: Madonna Large, DO   Interval Summary   Patient has had worsening mental status overnight.  She had difficulty answering what her name and date of birth is.  Vital Signs Vitals:   01/20/25 0500 01/20/25 0532 01/20/25 0806 01/20/25 0939  BP:  (!) 94/53 (!) 102/54   Pulse:  83 86   Resp:  19    Temp:  98.8 F (37.1 C)  99.1 F (37.3 C)  TempSrc:  Oral  Axillary  SpO2:  93%  94%  Weight: 55.5 kg     Height:        Intake/Output Summary (Last 24 hours) at 01/20/2025 1020 Last data filed at 01/20/2025 0657 Gross per 24 hour  Intake 779.82 ml  Output 405 ml  Net 374.82 ml      01/20/2025    5:00 AM 01/13/2025    7:36 PM 01/12/2025    9:08 PM  Last 3 Weights  Weight (lbs) 122 lb 5.7 oz 105 lb 2.6 oz 105 lb 9.6 oz  Weight (kg) 55.5 kg 47.7 kg 47.9 kg      Telemetry/ECG  Normal sinus rhythm with heart rates in the 80s.  Patient converted back into sinus rhythm yesterday at 1900 hrs.  Prior to this was in atrial fibrillation with heart rates in the 100s to 130s.- Personally Reviewed  Physical Exam  GEN: No acute distress.  Patient has had worsening confusion.  Is currently on 4 L nasal cannula. Neck: Positive JVD Cardiac: RRR, no murmurs, rubs, or gallops.  Respiratory: Positive bibasilar crackles. GI: Abdomen tender to palpation. MS: 1+ bilateral lower extremity edema edema  Assessment & Plan   [name] is a 79 y.o. 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     Acute appendicitis Periappendiceal abscess Patient presented to the hospital for concerns of nausea and vomiting.   CT abdomen and pelvis was done and indicated that the patient  had acute appendicitis with a periappendiceal abscess.   Surgery was consulted.   Surgery is currently favoring a conservative management approach but is continuing to follow the patient.   IR successfully placed a drain in the patient's abscess on 01/15/25.   Leukocytosis has worsened to 117.6     Persistent atrial flutter CHA2DS2-VASc Score = 6 [CHF History: 1, HTN History: 1, Diabetes History: 0, Stroke History: 0, Vascular Disease History: 1, Age Score: 2, Gender Score: 1].  Therefore, the patient's annual risk of stroke is 9.7 %.    Patient was initially diagnosed with atrial fibrillation back in 2023.   Prior to admission the patient was on Eliquis  5 mg twice daily, metoprolol  titrate 75 mg twice daily, and Cardizem  360 mg daily.   Discussed with patient and she prefers a rate control strategy over a rhythm control strategy.   Suspect that the patient's increased heart rates are secondary to acute appendicitis.   Was initially placed on IV amiodarone  but this was held after patient went back into sinus rhythm. Home metoprolol  75 mg twice daily was recently restarted.  May also consider restarting home Cardizem  if blood pressures improved.  Yesterday the patient went back into atrial flutter with RVR.  Because of that she was  started on oral amiodarone .  The patient later converted back into sinus rhythm.  Limited echo this hospitalization showed a normal LVEF of 55 to 60%. Continue metoprolol  tartrate 75 mg twice daily Continue IV heparin . May consider transitioning back to eliquis  when no more surgeries or procedures are planned.     History of cardiomyopathy Small pericardial effusion Acute on chronic diastolic heart failure Does not appear like patient has previously had an ischemic evaluation. An echocardiogram was done on 03/2022 and showed a moderately reduced LVEF of 40 to 45%. Denies symptoms of heart failure such as shortness of breath, orthopnea, lower extremity edema, PND. CT  abdomen and pelvis showed  small bilateral pleural effusions, cardiomegaly with mitral valve calcifications, and systemic atherosclerosis. Limited echo on 01/18/2025 showed small pericardial effusion and large left pleural effusion.  Patient is under consideration for thoracentesis.  Was started on IV Lasix  40 mg twice daily.  Creatinine this morning is worsened to 2.31.  Most recent weight is 122 pounds up from 105 on admission. Start 40 mg IV Lasix  twice daily. Order daily weights and strict I&O     CMML on chemotherapy Patient is followed by oncology and is currently on Vidaza  every 6-weeks Management per oncology   Prolonged Qtc EKG this morning showed normal sinus rhythm with a rate of 84 and a prolonged QTc of 424 with a QTc B of 501. QTc prolonging agents patient is currently on include amiodarone , Zofran , and possibly Flagyl .  Right lung pneumonia Chest x-ray showed Small pleural effusions with stable bibasilar hypoventilation. Progressing right upper lobe opacity suspicious for worsening pneumonia.   For questions or updates, please contact [facility] Please consult [email or web address] for contact info under   Signed, Morse Clause, PA-C   Patient seen and examined, note reviewed with the signed Advanced Practice Provider. I personally reviewed laboratory data, imaging studies and relevant notes. I independently examined the patient and formulated the important aspects of the plan. I have personally discussed the plan with the patient and/or family. Comments or changes to the note/plan are indicated below.  Patient seen and examined at her bedside.  In sinus rhythm today, continue current dose of Amiodarone  200 mg BID. Continue low dose lopressor .  Agree with restarting her IV Lasix .  Close monitoring of her cr and blood pressure.  If blood pressure drops may need to discontinue lopressor  for keeping her on the IV Lasix .   Dub Huntsman DO, MS [facility] Attending  Cardiologist [facility]  [address] [place], [place] [address] [phone] Website: [email or web address]  "

## 2025-01-20 NOTE — Progress Notes (Addendum)
 "                                                                                                                                                                                                          Daily Progress Note   Patient Name: [name]       Date: 01/20/2025 DOB: [birth year]  Age: 79 y.o. MRN#: [id number] Attending Physician: Kathrin Mignon DASEN, MD Primary Care Physician: [name], MD Admit Date: 01/12/2025  Reason for Follow-up: Establishing goals of care  Patient Profile/HPI:  79 y.o. 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  She is admitted with acute appendicitis with periappendiceal abscess, A-fib with RVR, and possible choledocholithiasis. On 1/22, she had IR drain placement in the periappendiceal abscess. Workup has also revealed pericardial effusion.    Palliative Medicine has been consulted for goals of care discussions and complex medical decision making.   Discussion: Chart reviewed-  Labs- elevated ammonia- 77 WBC up to 128.6, PCT up 1.80, proBNP 30,644, Cr is trending up 2.31. Chest xray personally reviewed- suspicious for worsening pneumonia.  On evaluation patient is not responding, she is moaning, grimacing and holding her stomach.  I met with husband. He is distressed at seeing [name] suffering. We discussed her code status and he requested change to DNR.  We discussed goals of care. Option for continued aggressive intervention vs transition to full comfort measures was discussed. Discussed transition to comfort measures only which includes stopping IV fluids, antibiotics, labs and providing symptom management for SOB, anxiety, nausea, vomiting, and other symptoms of dying.  [name] was uncertain. He requested to speak  with attending MD Dr. Kathrin before making decisions. However, he did want her to receive pain medication at this time.  I communicated [name]'s request to Dr. Kathrin and he kindly reached out and decision was made to transition to full comfort measures only.  I confirmed comfort measures with [name] and entered orders. Emotional support provided.  On second eval patient appeared more uncomfortable, with tremors. I discussed care with bedside RN.    Review of Systems  Unable to perform ROS: Mental status change     Physical Exam Vitals and nursing note reviewed.  Constitutional:      General: She is in acute distress.     Appearance: She is ill-appearing.  Comments: Frail, cachetic  Cardiovascular:     Rate and Rhythm: Normal rate.  Pulmonary:     Effort: Pulmonary effort is normal.  Skin:    Coloration: Skin is pale.  Neurological:     Comments: unresponsive             Vital Signs: BP 99/76   Pulse 86   Temp 97.9 F (36.6 C)   Resp 18   Ht 5' 1 (1.549 m)   Wt 55.5 kg   SpO2 95%   BMI 23.12 kg/m  SpO2: SpO2: 95 % O2 Device: O2 Device: Nasal Cannula O2 Flow Rate: O2 Flow Rate (L/min): 4 L/min  Intake/output summary:  Intake/Output Summary (Last 24 hours) at 01/20/2025 1358 Last data filed at 01/20/2025 9342 Gross per 24 hour  Intake 779.82 ml  Output 405 ml  Net 374.82 ml   LBM: Last BM Date : 01/19/25 Baseline Weight: Weight: 47.9 kg Most recent weight: Weight: 55.5 kg       Palliative Assessment/Data: PPS: 10%      Patient Active Problem List   Diagnosis Date Noted   PAF (paroxysmal atrial fibrillation) (HCC) 01/20/2025   Current use of long term anticoagulation 01/19/2025   Pericardial effusion 01/19/2025   Hyperlipidemia 01/19/2025   Pleural effusion 01/19/2025   Atrial fibrillation/flutter (HCC) 01/19/2025   Advance care planning 01/16/2025   Pressure injury of skin 01/16/2025   Acute appendicitis with appendiceal abscess 01/13/2025    Appendicitis 01/12/2025   Lung nodules 04/23/2024   Hyponatremia 11/13/2023   Sacral pain 11/13/2023   Normocytic anemia 11/13/2023   Celiac disease 10/05/2022   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   Gastro-esophageal reflux disease without esophagitis 10/05/2022   Pure hypercholesterolemia 10/05/2022   Acute urinary retention 06/20/2022   Arterial vascular disease 06/20/2022   Atrial fibrillation with rapid ventricular response (HCC) 06/19/2022   Acute respiratory failure with hypoxia (HCC) 05/19/2022   Rheumatoid arthritis (HCC) 05/19/2022   Closed fracture of metatarsal bone of left foot 04/21/2022   Hematoma of left foot 04/08/2022   Protein-calorie malnutrition, moderate 04/06/2022   Chronic systolic CHF (congestive heart failure) (HCC) 04/05/2022   Dyspnea on exertion 04/05/2022   Hypokalemia 04/05/2022   Physical deconditioning 04/05/2022   Chronic kidney disease, stage 3a (HCC) 04/05/2022   Constipation 04/05/2022   Atrial fibrillation with slow ventricular response (HCC) 03/29/2022   Multifocal pneumonia 03/29/2022   Pancytopenia (HCC) 03/29/2022   Bilateral pleural effusion 03/29/2022   Port-A-Cath in place 02/06/2022   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   Stage 2 moderate COPD by GOLD classification (HCC) 05/03/2021   Hx of multiple pulmonary nodules 05/03/2021   S/P shoulder replacement, 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   Thrombocytopenia 02/04/2015    Palliative Care Assessment & Plan    Assessment/Recommendations/Plan  Severe sepsis due to appendiceal abscess, AKI, hyperammoniemia, CML, hypocalcemia, pneumonia, CHF- patient's status greatly worse today- transitioned to full comfort measures only- Symptom management-  Lorazepam  IV 1mg  q4hr prn Hydromorphone  0.5mg  IV q15 min prn- will start infusion if needed- discussed with RN Robinul  0.2mg  IV q4hr prn Other comfort medications as needed D/C medications not indicated for comfort  Will eval tomorrow for  disposition options- likely anticipate hospital death Addendum- discussed with RN- lorazepam  was helpful for tremors- order entered for scheduled lorazepam  q6 hours  Code Status:   Code Status: Limited: Do not attempt resuscitation (DNR) -DNR-LIMITED -Do Not Intubate/DNI    Prognosis:  Hours - Days  Discharge Planning: To Be Determined  Thank  you for allowing the Palliative Medicine Team to assist in the care of this patient.  I personally spent a total of 80 minutes in the care of the patient today including preparing to see the patient, getting/reviewing separately obtained history, performing a medically appropriate exam/evaluation, counseling and educating, placing orders, referring and communicating with other health care professionals, and documenting clinical information in the EHR.   Cassondra Stain, AGNP-C Palliative Medicine   Please contact Palliative Medicine Team phone at [phone] for questions and concerns.        "

## 2025-01-20 NOTE — Progress Notes (Signed)
 SLP Cancellation Note  Patient Details Name: AMALIYA WHITELAW MRN: [id number] DOB: [birth year]   Cancelled treatment:       Reason Eval/Treat Not Completed: Medical issues which prohibited therapy. Pt just given something for pain, sleepy. Will f/u for swallow eval if needed.    Chiara Coltrin, Consuelo Fitch 01/20/2025, 2:40 PM

## 2025-01-20 NOTE — Progress Notes (Signed)
 [facility] - ANTICOAGULATION CONSULT NOTE  Pharmacy Consult for IV heparin  Indication: atrial fibrillation  Allergies[1]  Patient Measurements: Height: 5' 1 (154.9 cm) Weight: 55.5 kg (122 lb 5.7 oz) IBW/kg (Calculated) : 47.8 HEPARIN  DW (KG): 47.7  Vital Signs: Temp: 98.8 F (37.1 C) (01/27 0532) Temp Source: Oral (01/27 0532) BP: 94/53 (01/27 0532) Pulse Rate: 83 (01/27 0532)  Labs: Recent Labs    01/17/25 0649 01/18/25 0448 01/18/25 1612 01/18/25 1612 01/19/25 0159 01/19/25 0530 01/19/25 1121 01/20/25 0448  HGB 8.6* 9.4*  --   --   --  9.0*  --  8.8*  HCT 26.8* 30.5*  --   --   --  27.9*  --  28.1*  PLT 174 202  --   --   --  227  --  251  APTT  --   --  66*  --   --   --   --   --   HEPARINUNFRC  --   --  0.14*   < > 0.20*  --  0.58 0.51  CREATININE 1.02* 1.19*  --   --   --  1.64*  --   --    < > = values in this interval not displayed.    Estimated Creatinine Clearance: 21.3 mL/min (A) (by C-G formula based on SCr of 1.64 mg/dL (H)).   Assessment: 20 yoF with PMH Afib on PTA Eliquis , 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 Found to have acute appendicitis with periappendiceal abscess and possible choledocholithiasis. FOBT+. Also noted to be in Afib-RVR on admission. Had periappendiceal abscess drain placed 1/22; current plan is for non-surgical management d/t multiple comorbidities making patient extremely high-risk for surgery. On 1/25, Pharmacy consulted to resume heparin   Baseline INR slightly elevated (as expected w/ DOAC); aPTT WNL Prior anticoagulation: Eliquis  5 mg BID, last dose 1/18 at noon  Significant events:  Today, 01/20/2025: Heparin  level 0.51- therapeutic on heparin  850 units/hr CBC stable  No bleeding or infusion related concerns reported by RN   Goal of Therapy: Heparin  level 0.3-0.7 units/ml; initially would aim for lower end of goal (0.3-0.5) given recent LGIB Monitor platelets by  anticoagulation protocol: Yes  Plan: Continue heparin  drip @  850 units/hr IV infusion Daily CBC and daily heparin  level  Monitor for signs of bleeding or thrombosis F/u for ability to resume PTA Eliquis  once no longer a possibility of thoracentesis   Rosaline Millet, PharmD, BCPS 01/20/2025 5:56 AM            [1]  Allergies Allergen Reactions   Digoxin  And Related Rash and Other (See Comments)    Generalized total body rash with immense itching   Gluten Meal Diarrhea   Penicillins Rash and Other (See Comments)    Did it involve sudden or severe rash/hives, skin peeling, or any reaction on the inside of your mouth or nose? Yes When did it last happen? Many Years Ago      Fexofenadine Other (See Comments)    Dry mouth   Alendronate Rash   Hydroxychloroquine Rash

## 2025-01-20 NOTE — IPAL (Signed)
" °  Interdisciplinary Goals of Care Family Meeting   Date carried out: 01/20/2025  Location of the meeting: Bedside  Member's involved: Physician and Family Member or next of kin (husband)  Durable Power of Insurance risk surveyor: Husband  Discussion: We discussed goals of care for [name] . CODE STATUS was changed to DNR earlier by palliative care.  Notified by palliative medicine that husband has question for the doctor before making further goal of care decision.  When I went to visit, patient was somewhat somnolent.  Husband was at bedside.  He says she was since she received IV Dilaudid .  We had extensive discussion about patient's current condition and decline despite all the efforts made by care team.  We have discussed options including continuation of current intervention with attempts to fix problems which could also cause more suffering including pain and discomfort.  We have also discussed about full comfort care.  We have discussed what this entails  including use of medications for pain, air hunger, anxiety, agitation... to optimize patient's comfort for the time she got left.   Patient's husband voiced understanding.  Patient's husband states that they have been together for 60 years and had a wonderful time. He says she has been suffering since this morning.  He does not want her to continue suffering like this.  He thinks full comfort care is to her best interest at this point.  He is also open to Dilaudid  infusion if necessary to optimize comfort care.   I have notified care team including patient's RN, cardiology, surgery, oncology and palliative by secure chat.  Palliative to place comfort care orders.    Code status:   Code Status: Limited: Do not attempt resuscitation (DNR) -DNR-LIMITED -Do Not Intubate/DNI    Disposition: In-patient comfort care  Time spent for the meeting: 40 minutes    Mignon ONEIDA Bump, MD  01/20/2025, 3:18 PM   "

## 2025-01-20 NOTE — Progress Notes (Signed)
 Patient ID: [name], female   DOB: [birth year], 79 y.o.   MRN: [id number]  Referring Physician(s): Dr. Mitzie Freund, MD   Supervising Physician: Luverne Aran   Patient Status:  [facility] - In-pt   Chief Complaint: Intra-abdominal fluid collection s/p drain placement on 1/22.  Subjective:  Patient alert and lying in bed. She was responsive and appeared to be in slight pain. Patient has no significant complaints. Patient denies any fever, chills, chest pain, SOB, cough, abdominal pain, nausea, vomiting or bleeding.   Physical Exam: Constitutional: Normal appearance. Appears stated age.  Abdominal: Abdomen is flat. RLQ drain is appropriately dressed. Dressing is clean, dry and intact. Slight pain at drain incision site. 5 mL of serosanguinous output in JP drain. Line flushes well.  Assessment and Plan: Intra-abdominal fluid collection s/p RLQ drain placement 1/22 by Dr. Jennefer Ribas Location: RLQ Size: 8 Fr mini loop Date of placement: 1/22 Currently to: JP drain collection device No Imaging acquired 24 hour output Output by Drain (mL) 01/18/25 0700 - 01/18/25 1459 01/18/25 1500 - 01/18/25 2259 01/18/25 2300 - 01/19/25 0659 01/19/25 0700 - 01/19/25 1459 01/19/25 1500 - 01/19/25 2259 01/19/25 2300 - 01/20/25 0659 01/20/25 0700 - 01/20/25 1326  Closed System Drain Inferior;Lateral;Right Back   10   5      Current Examination:  - Flushes and aspirates easily - Insertion site unremarkable - Suture in place - Dressed appropriately  Plan:  - Continue 5 CC NS flushes TID - Record output Q shift - Dressing changes every day if soiled  Discharge planning: Please contact IR APP or on call IR MD prior to patient d/c to ensure appropriate follow up plans are in place. Typically patient will follow up with IR clinic 10-14 days post d/c for repeat imaging/possible drain injection. IR scheduler will contact patient with date/time of appointment. Patient will need to flush drain QD with  5 cc NS, record output QD, dressing changes every 2-3 days or earlier if soiled.   IR will continue to follow - please call with any questions or concerns.  Electronically Signed:  Leanor Forget PA-C 01/20/2025 1325

## 2025-01-20 NOTE — Progress Notes (Cosign Needed)
 [name]   DOB:06-15-1946   FM#:[id number]      CLINICAL SUMMARY:  [name] is a 79 year old female patient admitted from home on 01/12/2025 with complaints of emesis.  Oncologic history significant for CMML.  Medical oncology following.   ASSESSMENT & PLAN:  Abdominal pain Acute appendicitis with periappendiceal abscess - Status post right-sided JP drain intact. - IR managing drain, appreciated.  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 - Status post cycle 21 azacitidine  on 12/15/2024.  Cycle 22 due on 01/27/2025. - Medical oncology/Dr. Ileana following closely  Leukocytosis - WBC elevated 1-8.6 - Likely multifactorial related to CMML, and infectious process - Continue antibiotics as ordered - Continue to monitor CBC with differential  Anemia - Likely related to malignancy and acute illness - Hemoglobin 8.8 - Recommend PRBC transfusion for hemoglobin <7.0 - Continue to monitor CBC with differential  Pericardial effusion Bilateral pleural effusion History of CHF Pressure ulcers - Management by primary, surgical and cardio teams    Code Status Full  Subjective:  Patient seen awake alert and oriented laying in bed.  She is very weak appearing.  Right-sided JP drain intact.  Reports right-sided abdominal pain.  Denies chest pain.  Denies shortness of breath or other acute issues.  Objective:   Intake/Output Summary (Last 24 hours) at 01/20/2025 1031 Last data filed at 01/20/2025 9342 Gross per 24 hour  Intake 779.82 ml  Output 405 ml  Net 374.82 ml     PHYSICAL EXAMINATION: ECOG PERFORMANCE STATUS: 4 - Bedbound  Vitals:   01/20/25 0806 01/20/25 0939  BP: (!) 102/54   Pulse: 86   Resp:    Temp:  99.1 F (37.3 C)  SpO2:  94%   Filed Weights   01/12/25 2108 01/13/25 1936 01/20/25 0500  Weight: 105 lb 9.6 oz (47.9 kg) 105 lb 2.6 oz (47.7 kg) 122 lb 5.7 oz (55.5 kg)    GENERAL: alert, no distress and comfortable + chronically  ill-appearing SKIN: + Pale skin color, texture, + left heel and foot ulcers with multiple dressings intact EYES: normal, conjunctiva are pink and non-injected, sclera clear OROPHARYNX: no exudate, no erythema and lips, buccal mucosa, and tongue normal  NECK: supple, thyroid  normal size, non-tender, without nodularity LYMPH: no palpable lymphadenopathy in the cervical, axillary or inguinal LUNGS: clear to auscultation and percussion with normal breathing effort HEART: regular rate & rhythm and no murmurs and no lower extremity edema ABDOMEN: abdomen soft, non-tender and normal bowel sounds MUSCULOSKELETAL: no cyanosis of digits and no clubbing  PSYCH: alert & oriented x 3 with fluent speech NEURO: no focal motor/sensory deficits   All questions were answered. The patient knows to call the clinic with any problems, questions or concerns.   I personally spent a total of 40 minutes minutes in the care of the patient today including preparing to see the patient, getting/reviewing separately obtained history, performing a medically appropriate exam/evaluation, referring and communicating with other health care professionals, documenting clinical information in the EHR, communicating results, and coordinating care.    Olam PARAS Enmanuel Zufall, NP 01/20/2025 10:31 AM    Labs Reviewed:  Lab Results  Component Value Date   WBC 128.6 (HH) 01/20/2025   HGB 8.8 (L) 01/20/2025   HCT 28.1 (L) 01/20/2025   MCV 105.6 (H) 01/20/2025   PLT 251 01/20/2025   Recent Labs    01/14/25 0304 01/15/25 0830 01/16/25 0815 01/18/25 0448 01/19/25 0530 01/20/25 0448  NA 136 136   < > 134* 136  135  K 3.8 3.8   < > 4.4 3.9 4.0  CL 105 107   < > 105 107 104  CO2 24 20*   < > 17* 17* 17*  GLUCOSE 100* 96   < > 150* 80 73  BUN 17 12   < > 12 18 22   CREATININE 0.98 0.89   < > 1.19* 1.64* 2.31*  CALCIUM  6.8* 6.6*   < > 6.8* 6.5* 6.7*  GFRNONAA 59* >60   < > 47* 32* 21*  PROT 5.0* 5.2*  --   --   --  4.9*  ALBUMIN   2.9* 2.8*   < > 3.1* 2.8* 2.8*  AST 19 19  --   --   --  32  ALT 10 9  --   --   --  12  ALKPHOS 86 82  --   --   --  120  BILITOT 0.4 0.4  --   --   --  0.3   < > = values in this interval not displayed.    Studies Reviewed:   DG CHEST PORT 1 VIEW Result Date: 01/20/2025 EXAM: 1 VIEW(S) XRAY OF THE CHEST 01/20/2025 05:58:00 AM COMPARISON: 01/19/2025 CLINICAL HISTORY: 79 year old female with volume overload. FINDINGS: LINES, TUBES AND DEVICES: Right chest port with tip terminating over the superior cavoatrial junction, stable in place. LUNGS AND PLEURA: Small bilateral pleural effusions do not appear significantly changed, greater on the left. Stable bibasilar hypoventilation. Increasing confluence of widespread right upper lobe opacity radiating from the hilum. No air bronchograms at this time. No pneumothorax. No overt edema. HEART AND MEDIASTINUM: Aortic arch atherosclerosis. No acute abnormality of the cardiac and mediastinal silhouettes. BONES AND SOFT TISSUES: Right shoulder arthroplasty noted. No acute osseous abnormality. ABDOMEN: Paucity of visible bowel gas. IMPRESSION: 1. Small pleural effusions with stable bibasilar hypoventilation. Progressing right upper lobe opacity suspicious for worsening pneumonia. Electronically signed by: Helayne Hurst MD 01/20/2025 06:02 AM EST RP Workstation: [id number]   DG Chest 2 View Result Date: 01/19/2025 EXAM: 2 VIEW(S) XRAY OF THE CHEST 01/19/2025 05:09:00 PM COMPARISON: 01/17/2025 CLINICAL HISTORY: Pleural effusion. FINDINGS: LINES, TUBES AND DEVICES: Right chest port in place with tip terminating over the superior cavoatrial junction. LUNGS AND PLEURA: Small bilateral pleural effusions. Increased interstitial markings throughout the right lung and left lung base. Infiltrates in the right base, likely pneumonia. Aspiration would also be a possibility. Right base infiltrates have progressed since the prior study. No pneumothorax. HEART AND MEDIASTINUM:  Atherosclerotic calcifications. No acute abnormality of the cardiac and mediastinal silhouettes. BONES AND SOFT TISSUES: Status post right shoulder arthroplasty. No acute osseous abnormality. IMPRESSION: 1. Small bilateral pleural effusions. 2. Progressive right basilar infiltrates, likely pneumonia. Aspiration is also a possibility. Electronically signed by: Elsie Gravely MD 01/19/2025 05:18 PM EST RP Workstation: [id number]   ECHOCARDIOGRAM LIMITED Result Date: 01/18/2025    ECHOCARDIOGRAM LIMITED REPORT   Patient Name:   [name] [facility] Date of Exam: 01/18/2025 Medical Rec #:  [id number]        Height:       61.0 in Accession #:    [id number]       Weight:       105.2 lb Date of Birth:  [birth year]       BSA:          1.437 m Patient Age:    78 years         BP:  119/64 mmHg Patient Gender: F                HR:           77 bpm. Exam Location:  Inpatient Procedure: Cardiac Doppler, Limited Echo and Limited Color Doppler (Both            Spectral and Color Flow Doppler were utilized during procedure). Indications:    Pericardial effusion I31.3  History:        Patient has prior history of Echocardiogram examinations, most                 recent 01/15/2025. Risk Factors:Hypertension.  Sonographer:    Sydnee [name] RDCS Referring Phys: [id number] FRANCK H AZOBOU TONLEU IMPRESSIONS  1. Left ventricular ejection fraction, by estimation, is 55 to 60%. Left ventricular ejection fraction by 2D MOD biplane is 57.1 %. The left ventricle has normal function.  2. A small pericardial effusion is present. The pericardial effusion is posterior to the left ventricle. Large pleural effusion in the left lateral region.  3. Aortic valve sclerosis/calcification is present, without any evidence of aortic stenosis. Comparison(s): Prior images reviewed side by side. There remains a large pleural effusion but pericardial effusion is now small (best seen in subcostal view). FINDINGS  Left Ventricle: Left ventricular ejection  fraction, by estimation, is 55 to 60%. Left ventricular ejection fraction by 2D MOD biplane is 57.1 %. The left ventricle has normal function. Pericardium: A small pericardial effusion is present. The pericardial effusion is posterior to the left ventricle. Aortic Valve: Aortic valve sclerosis/calcification is present, without any evidence of aortic stenosis. Additional Comments: There is a large pleural effusion in the left lateral region.   LV Volumes (MOD)               Biplane EF (MOD) LV vol d, MOD    57.2 ml       LV Biplane EF:   Left A2C:                                            ventricular LV vol d, MOD    58.2 ml                        ejection A4C:                                            fraction by LV vol s, MOD    24.1 ml                        2D MOD A2C:                                            biplane is LV vol s, MOD    23.6 ml                        57.1 %. A4C: LV SV MOD A2C:   33.1 ml LV SV MOD A4C:   58.2 ml LV SV MOD BP:    33.0 ml Joelle  Azobou Tonleu Electronically signed by Joelle Ren Ny Signature Date/Time: 01/18/2025/11:12:50 AM    Final    DG Chest Port 1 View Result Date: 01/17/2025 EXAM: 1 VIEW(S) XRAY OF THE CHEST 01/17/2025 08:26:32 AM COMPARISON: 11/12/2023 CLINICAL HISTORY: Shortness of breath. FINDINGS: LINES, TUBES AND DEVICES: Right chest port with catheter tip at proximal right atrium. LUNGS AND PLEURA: New small bilateral pleural effusions, left greater than right. New bilateral interstitial opacities, and airspace opacities in the lung bases left worse than right. No pneumothorax. HEART AND MEDIASTINUM: Mild cardiomegaly. Aortic arch atherosclerosis. No acute abnormality of the cardiac and mediastinal silhouettes. BONES AND SOFT TISSUES: Stable right shoulder arthroplasty. No acute osseous abnormality. IMPRESSION: 1. New bilateral interstitial and airspace opacities in the lung bases, left worse than right. 2. New small bilateral pleural effusions, left greater  than right. 3. Mild cardiomegaly. Electronically signed by: Katheleen Faes MD 01/17/2025 03:24 PM EST RP Workstation: [id number]   CT GUIDED PERITONEAL/RETROPERITONEAL FLUID DRAIN BY PERC CATH Result Date: 01/15/2025 INDICATION: 79 year old female with right lower quadrant fluid collection concerning for abscess, likely perforated appendicitis. EXAM: CT PERC DRAIN PERITONEAL ABCESS COMPARISON:  01/14/2025 MEDICATIONS: The patient is currently admitted to the hospital and receiving intravenous antibiotics. The antibiotics were administered within an appropriate time frame prior to the initiation of the procedure. ANESTHESIA/SEDATION: Moderate (conscious) sedation was employed during this procedure. A total of Versed  2 mg and Fentanyl  100 mcg was administered intravenously. Moderate Sedation Time: 11 minutes. The patient's level of consciousness and vital signs were monitored continuously by radiology nursing throughout the procedure under my direct supervision. CONTRAST:  None COMPLICATIONS: None immediate. PROCEDURE: RADIATION DOSE REDUCTION: This exam was performed according to the departmental dose-optimization program which includes automated exposure control, adjustment of the mA and/or kV according to patient size and/or use of iterative reconstruction technique. Informed written consent was obtained from the patient after a discussion of the risks, benefits and alternatives to treatment. The patient was placed supine on the CT gantry and a pre procedural CT was performed re-demonstrating the known abscess/fluid collection within the right lower quadrant. The procedure was planned. A timeout was performed prior to the initiation of the procedure. The right lower quadrant was prepped and draped in the usual sterile fashion. The overlying soft tissues were anesthetized with 1% lidocaine  with epinephrine . Appropriate trajectory was planned with the use of a 22 gauge spinal needle. An 18 gauge trocar needle was  advanced into the abscess/fluid collection and a short Amplatz super stiff wire was coiled within the collection. Appropriate positioning was confirmed with a limited CT scan. The tract was serially dilated allowing placement of a 8 French mini loop drainage catheter. Appropriate positioning was confirmed with a limited postprocedural CT scan. Twenty ml of purulent fluid was aspirated. The tube was connected to a bulb suction and sutured in place. A dressing was placed. The patient tolerated the procedure well without immediate post procedural complication. IMPRESSION: Successful CT guided placement of a 8 French mini loop drain catheter into the periappendiceal abscess with aspiration of 20 mL of purulent fluid. Samples were sent to the laboratory as requested by the ordering clinical team. Ester Sides, MD [facility] Electronically Signed   By: Ester Sides M.D.   On: 01/15/2025 13:47   ECHOCARDIOGRAM COMPLETE Result Date: 01/15/2025    ECHOCARDIOGRAM REPORT   Patient Name:   [name] Date of Exam: 01/15/2025 Medical Rec #:  [id number]  Height:       61.0 in Accession #:    [id number]       Weight:       105.2 lb Date of Birth:  03-21-1946       BSA:          1.437 m Patient Age:    78 years         BP:           99/53 mmHg Patient Gender: F                HR:           97 bpm. Exam Location:  Inpatient Procedure: 2D Echo, Cardiac Doppler and Color Doppler (Both Spectral and Color            Flow Doppler were utilized during procedure). Indications:    Atrial Flutter  History:        Patient has prior history of Echocardiogram examinations, most                 recent 03/30/2022. COPD; Arrythmias:Atrial Fibrillation.  Sonographer:    Philomena Daring Referring Phys: [id number] ZANE ADAMS IMPRESSIONS  1. Left ventricular ejection fraction, by estimation, is 50 to 55%. The left ventricle has low normal function. The left ventricle has no regional  wall motion abnormalities. Left ventricular diastolic function could not be evaluated.  2. Right ventricular systolic function is low normal. The right ventricular size is normal. Mildly increased right ventricular wall thickness. There is normal pulmonary artery systolic pressure.  3. Moderate size pericardial effusion (best seen subcostal view, posterior to the LV, respiratory variations across the MV and TV not obtained, IVC is dilated but compressible, not suggestive of tamponade physiology, clinical correlation required). Large pleural effusion in the left lateral region.  4. The mitral valve is degenerative. Mild mitral valve regurgitation. No evidence of mitral stenosis.  5. The aortic valve was not well visualized. Unable to determine aortic valve morphology due to image quality. Aortic valve regurgitation is not visualized. No aortic stenosis is present.  6. The inferior vena cava is dilated in size with >50% respiratory variability, suggesting right atrial pressure of 8 mmHg. Comparison(s): A prior study was performed on 03/30/2022. LVEF 45-50%, RV function mildly reduced, mild/moderate MR, estimated RAP . Pericardial and pleural effusion are new compared to prior study. Conclusion(s)/Recommendation(s): Consider repeat limited echo in 24-48hrs to evaluate the pericaridal effusion and obtain images to evaluate for tamponade physiology if clinically indicated. FINDINGS  Left Ventricle: Left ventricular ejection fraction, by estimation, is 50 to 55%. The left ventricle has low normal function. The left ventricle has no regional wall motion abnormalities. The left ventricular internal cavity size was small. There is no left ventricular hypertrophy. Left ventricular diastolic function could not be evaluated due to atrial fibrillation. Left ventricular diastolic function could not be evaluated. Right Ventricle: The right ventricular size is normal. Mildly increased right ventricular wall thickness. Right  ventricular systolic function is low normal. There is normal pulmonary artery systolic pressure. The tricuspid regurgitant velocity is 2.63 m/s, and with an assumed right atrial pressure of 8 mmHg, the estimated right ventricular systolic pressure is 35.7 mmHg. Left Atrium: Left atrial size was normal in size. Right Atrium: Right atrial size was normal in size. Pericardium: Moderate size pericardial effusion (best seen subcostal view, posterior to the LV, respiratory variations across the MV and TV not obtained, IVC is dilated but compressible, not suggestive of tamponade physiology, clinical  correlation required). Mitral Valve: The mitral valve is degenerative in appearance. Mild mitral valve regurgitation. No evidence of mitral valve stenosis. Tricuspid Valve: The tricuspid valve is grossly normal. Tricuspid valve regurgitation is mild . No evidence of tricuspid stenosis. Aortic Valve: The aortic valve was not well visualized. Aortic valve regurgitation is not visualized. No aortic stenosis is present. Pulmonic Valve: The pulmonic valve was not well visualized. Pulmonic valve regurgitation is not visualized. Aorta: The aortic root and ascending aorta are structurally normal, with no evidence of dilitation. Venous: The inferior vena cava is dilated in size with greater than 50% respiratory variability, suggesting right atrial pressure of 8 mmHg. IAS/Shunts: The atrial septum is grossly normal. Additional Comments: There is a large pleural effusion in the left lateral region.  LEFT VENTRICLE PLAX 2D LVIDd:         3.20 cm   Diastology LVIDs:         2.30 cm   LV e' medial:    8.12 cm/s LV PW:         0.80 cm   LV E/e' medial:  13.6 LV IVS:        0.90 cm   LV e' lateral:   10.47 cm/s LVOT diam:     1.80 cm   LV E/e' lateral: 10.6 LV SV:         39 LV SV Index:   27 LVOT Area:     2.54 cm  RIGHT VENTRICLE             IVC RV S prime:     10.10 cm/s  IVC diam: 2.10 cm TAPSE (M-mode): 1.4 cm LEFT ATRIUM              Index        RIGHT ATRIUM           Index LA diam:        3.40 cm 2.37 cm/m   RA Area:     17.10 cm LA Vol (A2C):   40.2 ml 27.97 ml/m  RA Volume:   40.30 ml  28.04 ml/m LA Vol (A4C):   38.4 ml 26.72 ml/m LA Biplane Vol: 41.8 ml 29.08 ml/m  AORTIC VALVE LVOT Vmax:   104.00 cm/s LVOT Vmean:  67.000 cm/s LVOT VTI:    0.155 m  AORTA Ao Root diam: 2.30 cm Ao Asc diam:  2.30 cm MITRAL VALVE                TRICUSPID VALVE MV Area (PHT): 8.34 cm     TR Peak grad:   27.7 mmHg MV Decel Time: 91 msec      TR Vmax:        263.00 cm/s MV E velocity: 110.67 cm/s                             SHUNTS                             Systemic VTI:  0.16 m                             Systemic Diam: 1.80 cm Sunit Tolia Electronically signed by Madonna Large Signature Date/Time: 01/15/2025/10:48:44 AM    Final    CT ABDOMEN PELVIS W CONTRAST Result Date: 01/14/2025 EXAM: CT ABDOMEN AND PELVIS WITH CONTRAST  01/14/2025 03:25:20 PM TECHNIQUE: CT of the abdomen and pelvis was performed with the administration of 80 mL iohexol  (OMNIPAQUE ) 300 MG/ML solution. Multiplanar reformatted images are provided for review. Automated exposure control, iterative reconstruction, and/or weight-based adjustment of the mA/kV was utilized to reduce the radiation dose to as low as reasonably achievable. COMPARISON: 01/12/2025 CLINICAL HISTORY: RLQ abdominal pain FINDINGS: LOWER CHEST: Similar moderate volume bilateral pleural effusions. Similar appearance of the enhancing ovoid mass in the right lung base in the extrapleural fat measuring 13 x 27 mm. Similar compressive atelectasis also noted in the lung bases. Partially visualized central venous catheter terminating at the cavoatrial junction. LIVER: Unchanged cyst in the left hepatic lobe. Mild dilation of the intrahepatic biliary ducts. GALLBLADDER AND BILE DUCTS: Punctate radiopaque gallstones. No wall thickening. Minimal dilation of the intrahepatic biliary ducts with a 5 mm calculus layering in the  distal common bile duct (axial 45). SPLEEN: No acute abnormality. PANCREAS: No acute abnormality. ADRENAL GLANDS: No acute abnormality. KIDNEYS, URETERS AND BLADDER: Similar appearance of multiple bilateral renal cysts. No stones in the kidneys or ureters. No hydronephrosis. No perinephric or periureteral stranding. The urinary bladder is distended without focal abnormality. GI AND BOWEL: Decompressed stomach. Ampullary duodenal diverticulum measuring 1.9 cm. Decompressed appendix with mucosal enhancement. Similar appearance of the peripherally enhancing fluid collection centered about the distal appendix, measuring 3.2 x 4.5 x 3.9 cm. Extensive descending and sigmoid colonic diverticulosis. No changes of acute diverticulitis. No extravasation of enteric contrast to suggest bowel perforation. There is no bowel obstruction. PERITONEUM AND RETROPERITONEUM: No ascites. No free air. Similar appearance of the large multilobular fat containing masses in the presacral space. VASCULATURE: Aorta is normal in caliber. Diffuse aortoiliac atherosclerosis. LYMPH NODES: No lymphadenopathy. REPRODUCTIVE ORGANS: Age related atrophy of the uterus and ovaries. BONES AND SOFT TISSUES: Diffuse osteopenia. Moderate dextral curvature of the thoracolumbar spine with superimposed multilevel degenerative disc disease. Mild anasarca. No focal soft tissue abnormality. IMPRESSION: 1. Stable periappendiceal abscess centered about the distal appendix, measuring 3.2 x 4.5 x 3.9 cm. 2. 5 mm linear calcification again noted in the distal CBD, most likely reflecting choledocholithiasis. Minimal intrahepatic biliary ductal dilation present, correlation with serum bilirubin recommended. 3. Similar appearance of the moderate bilateral pleural effusions. Electronically signed by: Rogelia Myers MD 01/14/2025 04:05 PM EST RP Workstation: [id number]   CT ABDOMEN PELVIS W CONTRAST Result Date: 01/12/2025 EXAM: CT ABDOMEN AND PELVIS WITH CONTRAST  01/12/2025 12:46:02 PM TECHNIQUE: CT of the abdomen and pelvis was performed with the administration of 100 mL of iohexol  (OMNIPAQUE ) 300 MG/ML solution. Multiplanar reformatted images are provided for review. Automated exposure control, iterative reconstruction, and/or weight-based adjustment of the mA/kV was utilized to reduce the radiation dose to as low as reasonably achievable. COMPARISON: 11/12/2023 CLINICAL HISTORY: Nausea, vomiting, and weakness for 3 days. History of chronic myelomonocytic leukemia. * Tracking Code: BO * FINDINGS: LOWER CHEST: Stable appearance of lower thoracic paraspinal and bilateral posterior pleural based masses, the posterior pleural based mass is a short axis diameter of 1.3 cm, previously 1.2 cm. Small bilateral pleural effusions with passive atelectasis. Cardiomegaly noted with mitral valve calcifications. Emphysema. LIVER: Scattered hypodense hepatic lesions favoring cysts, the largest is in the lateral segment left hepatic lobe with fluid density and measuring 1.5 cm in long axis. GALLBLADDER AND BILE DUCTS: The dependent density in the gallbladder suspicious for small gallstone, image 28 series 2. 5 mm linear calcification posteriorly in the distal common bile duct suspicious for choledocholithiasis, image 33 series  2 and image 80 series 10. The common bile duct measures 6 mm in diameter which is within normal limits and similar to prior. No intrahepatic biliary dilatation. SPLEEN: No acute abnormality. PANCREAS: No acute abnormality. ADRENAL GLANDS: No acute abnormality. KIDNEYS, URETERS AND BLADDER: In addition to bilateral Bosniak category 1 renal cysts there are multiple hypodense renal lesions which are likely cysts but are technically too small to characterize. These lesions do not require further imaging workup. No stones in the kidneys or ureters. No hydronephrosis. No perinephric or periureteral stranding. Urinary bladder is unremarkable. GI AND BOWEL: Abnormal  pericecal collection in the expected vicinity of the appendix measuring 4.8 x 2.7 x 2.6 cm, not readily separable from the appendix, suspicious for appendicitis possibly with contained periappendiceal abscess. Appendiceal mass is a less likely differential diagnostic consideration given the unremarkable appearance of the appendix on 11/12/2023. Correlate with the patient's symptoms. There is a mild stranding around this lesion indicating inflammation. Sigmoid colon diverticulosis. Scattered diverticula elsewhere in the descending colon. Stomach demonstrates no acute abnormality. There is no bowel obstruction. PERITONEUM AND RETROPERITONEUM: No ascites. No free air. VASCULATURE: Systemic atherosclerosis is present, including the aorta and iliac arteries. Aorta is normal in caliber. LYMPH NODES: No lymphadenopathy. REPRODUCTIVE ORGANS: No acute abnormality. 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 Sclerosis in the sacrum likely attributable to remote sacral insufficiency fracture. Extra convex lower thoracic and upper lumbar scoliosis. Right foraminal impingement of L4-L5 and L5-S1 primarily from spondylosis. IMPRESSION: 1. Abnormal pericecal collection inseparable from the appendix, suspicious for acute appendicitis with contained periappendiceal abscess; surgical consultation is recommended. 2. Linear calcification in the distal common bile duct suspicious for choledocholithiasis. 3. Cholelithiasis. 4. Similar appearance of presacral fatty masses, favored extramedullary hematopoiesis. 5. Stable lower thoracic paraspinal and bilateral posterior pleural-based masses, likewise favoring extramedullary hematopoiesis. 6. Small bilateral pleural effusions with passive  atelectasis. 7. Emphysema. 8. Cardiomegaly with mitral valve calcifications. 9. Sigmoid colon diverticulosis. 10. Systemic atherosclerosis. 11. Sclerosis in the sacrum likely from remote sacral insufficiency fracture. 12. Lower thoracic and upper lumbar scoliosis. 13. Spondylosis with right foraminal impingement at L4-5 and L5-S1. Electronically signed by: Ryan Salvage MD 01/12/2025 02:30 PM EST RP Workstation: [id number]   Addendum I have seen the patient, examined her. I agree with the assessment and and plan and have edited the notes.   Patient's overall condition deteriorated rapidly in the past 1 to 2 days, family has agreed with comfort care.  Patient was lethargic, nonverbal when I saw her.  I spoke with her husband and provided emotional support. Unfortunately she will probably die in the next day or two.   Onita Mattock MD 2025/01/24

## 2025-01-20 NOTE — Progress Notes (Signed)
 "  Progress Note     Subjective: Hospitalist and nursing staff present during encounter.  Patient having nausea and feels that she needs to vomit during encounter. Having jerking movements that hospitalist feels could be related to gabapentin  which has now been discontinued. Patient reports right lower abdominal pain. One stool recorded. Had some PO intake.   ROS  All negative with the exception of above.  Objective: Vital signs in last 24 hours: Temp:  [98.1 F (36.7 C)-98.8 F (37.1 C)] 98.8 F (37.1 C) (01/27 0532) Pulse Rate:  [83-90] 86 (01/27 0806) Resp:  [14-19] 19 (01/27 0532) BP: (89-106)/(53-75) 102/54 (01/27 0806) SpO2:  [90 %-94 %] 93 % (01/27 0532) Weight:  [55.5 kg] 55.5 kg (01/27 0500) Last BM Date : 01/19/25  Intake/Output from previous day: 01/26 0701 - 01/27 0700 In: 979.8 [P.O.:520; I.V.:202.6; IV Piggyback:252.3] Out: 405 [Urine:400; Drains:5] Intake/Output this shift: No intake/output data recorded.  PE: General: Pleasant chronically ill appearing female who is laying in bed in NAD. Abd: Soft, nondistended. Tenderness to palpation of RLQ.  No peritoneal signs.  Drain output is minimal slightly murky serosanguineous. Skin: Warm and dry.  Psych: A&Ox3 with an appropriate affect.     Lab Results:  Recent Labs    01/19/25 0530 01/20/25 0448  WBC 117.6* 128.6*  HGB 9.0* 8.8*  HCT 27.9* 28.1*  PLT 227 251   BMET Recent Labs    01/19/25 0530 01/20/25 0448  NA 136 135  K 3.9 4.0  CL 107 104  CO2 17* 17*  GLUCOSE 80 73  BUN 18 22  CREATININE 1.64* 2.31*  CALCIUM  6.5* 6.7*   PT/INR No results for input(s): LABPROT, INR in the last 72 hours. CMP     Component Value Date/Time   NA 135 01/20/2025 0448   NA 141 01/04/2016 1001   K 4.0 01/20/2025 0448   K 4.5 01/04/2016 1001   CL 104 01/20/2025 0448   CO2 17 (L) 01/20/2025 0448   CO2 24 01/04/2016 1001   GLUCOSE 73 01/20/2025 0448   GLUCOSE 105 01/04/2016 1001   BUN 22 01/20/2025  0448   BUN 21.7 01/04/2016 1001   CREATININE 2.31 (H) 01/20/2025 0448   CREATININE 1.48 (H) 12/15/2024 1057   CREATININE 1.2 (H) 01/04/2016 1001   CALCIUM  6.7 (L) 01/20/2025 0448   CALCIUM  9.7 01/04/2016 1001   PROT 4.9 (L) 01/20/2025 0448   PROT 7.0 01/04/2016 1001   ALBUMIN  2.8 (L) 01/20/2025 0448   ALBUMIN  4.0 01/04/2016 1001   AST 32 01/20/2025 0448   AST 21 12/21/2023 1359   AST 26 01/04/2016 1001   ALT 12 01/20/2025 0448   ALT 14 12/21/2023 1359   ALT 24 01/04/2016 1001   ALKPHOS 120 01/20/2025 0448   ALKPHOS 61 01/04/2016 1001   BILITOT 0.3 01/20/2025 0448   BILITOT 0.8 12/21/2023 1359   BILITOT 0.41 01/04/2016 1001   GFRNONAA 21 (L) 01/20/2025 0448   GFRNONAA 36 (L) 12/15/2024 1057   GFRAA 56 (L) 01/09/2020 1339   Lipase     Component Value Date/Time   LIPASE 26 01/12/2025 1123       Studies/Results: DG CHEST PORT 1 VIEW Result Date: 01/20/2025 EXAM: 1 VIEW(S) XRAY OF THE CHEST 01/20/2025 05:58:00 AM COMPARISON: 01/19/2025 CLINICAL HISTORY: 79 year old female with volume overload. FINDINGS: LINES, TUBES AND DEVICES: Right chest port with tip terminating over the superior cavoatrial junction, stable in place. LUNGS AND PLEURA: Small bilateral pleural effusions do not appear significantly changed, greater  on the left. Stable bibasilar hypoventilation. Increasing confluence of widespread right upper lobe opacity radiating from the hilum. No air bronchograms at this time. No pneumothorax. No overt edema. HEART AND MEDIASTINUM: Aortic arch atherosclerosis. No acute abnormality of the cardiac and mediastinal silhouettes. BONES AND SOFT TISSUES: Right shoulder arthroplasty noted. No acute osseous abnormality. ABDOMEN: Paucity of visible bowel gas. IMPRESSION: 1. Small pleural effusions with stable bibasilar hypoventilation. Progressing right upper lobe opacity suspicious for worsening pneumonia. Electronically signed by: Helayne Hurst MD 01/20/2025 06:02 AM EST RP Workstation:  [id number]   DG Chest 2 View Result Date: 01/19/2025 EXAM: 2 VIEW(S) XRAY OF THE CHEST 01/19/2025 05:09:00 PM COMPARISON: 01/17/2025 CLINICAL HISTORY: Pleural effusion. FINDINGS: LINES, TUBES AND DEVICES: Right chest port in place with tip terminating over the superior cavoatrial junction. LUNGS AND PLEURA: Small bilateral pleural effusions. Increased interstitial markings throughout the right lung and left lung base. Infiltrates in the right base, likely pneumonia. Aspiration would also be a possibility. Right base infiltrates have progressed since the prior study. No pneumothorax. HEART AND MEDIASTINUM: Atherosclerotic calcifications. No acute abnormality of the cardiac and mediastinal silhouettes. BONES AND SOFT TISSUES: Status post right shoulder arthroplasty. No acute osseous abnormality. IMPRESSION: 1. Small bilateral pleural effusions. 2. Progressive right basilar infiltrates, likely pneumonia. Aspiration is also a possibility. Electronically signed by: Elsie Gravely MD 01/19/2025 05:18 PM EST RP Workstation: [id number]    Anti-infectives: Anti-infectives (From admission, onward)    Start     Dose/Rate Route Frequency Ordered Stop   01/19/25 1315  cefTRIAXone  (ROCEPHIN ) 2 g in sodium chloride  0.9 % 100 mL IVPB        2 g 200 mL/hr over 30 Minutes Intravenous Every 24 hours 01/19/25 1220     01/19/25 0600  metroNIDAZOLE  (FLAGYL ) IVPB 500 mg        500 mg 100 mL/hr over 60 Minutes Intravenous Every 12 hours 01/18/25 2003     01/14/25 0600  ceFEPIme  (MAXIPIME ) 2 g in sodium chloride  0.9 % 100 mL IVPB  Status:  Discontinued        2 g 200 mL/hr over 30 Minutes Intravenous Every 12 hours 01/13/25 1629 01/19/25 1220   01/12/25 1830  metroNIDAZOLE  (FLAGYL ) IVPB 500 mg  Status:  Discontinued        500 mg 100 mL/hr over 60 Minutes Intravenous Every 12 hours 01/12/25 1822 01/18/25 2003   01/12/25 1830  ceFEPIme  (MAXIPIME ) 2 g in sodium chloride  0.9 % 100 mL IVPB  Status:  Discontinued        2  g 200 mL/hr over 30 Minutes Intravenous Every 8 hours 01/12/25 1825 01/13/25 1629   01/12/25 1445  ceFEPIme  (MAXIPIME ) 2 g in sodium chloride  0.9 % 100 mL IVPB       Placed in And Linked Group   2 g 200 mL/hr over 30 Minutes Intravenous  Once 01/12/25 1436 01/12/25 1533   01/12/25 1445  metroNIDAZOLE  (FLAGYL ) IVPB 500 mg       Placed in And Linked Group   500 mg 100 mL/hr over 60 Minutes Intravenous  Once 01/12/25 1436 01/12/25 1606        Assessment/Plan Periappendiceal abscess -Reviewed with IR 1/19, plan at that time for repeat CT in a few days - CT 1/22 with minimal change and subsequently IR drain placed successfully. -Afebrile, WBC remains elevated 128.6 from 117.6.  -Continue with IV abx  -Given her frailty and multiple medical problems including recent chemo, daily steroid use, Eliquis , active A-fib with  RVR and hypotension etc, we pan to continue to treat with conservative management at this time and avoid surgical intervention if able. She is extremely high risk.   -Per primary team, pt thoracentesis not able to be completed. On going work up among hospitalist and oncology regarding leukocytosis.    FEN -gluten-free diet/1500 mL fluid restriction; IVFs per primary team VTE - Hold Eliquis , Heparin  infusions IV ID - Cefipime/Flagyl    UGI Bleed/Melena/anemia - unclear etiology.  No history of ulcer disease or NSAID use.  Her hgb appears stable at this time and she is HDS stable considering her Afib with RVR.  Choledocholithiasis - seems to be asymptomatic.  LFTs are normal indicating likely this to be nonocclusive.  Monitor LFTs.  At some point, will likely need this addressed, but as of now this is not the most acute problem. Dr. Kristie evaluating. 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 WBC baseline prior to admission ranging from 25-47 CHF/ cardiomyopathy A fib, RVR - cardiology consulted, echo pending. 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  This care required moderate level of medical decision making.   Marjorie Carlyon Favre, [facility] Surgery 01/20/2025, 8:59 AM Please see Amion for pager number during day hours 7:00am-4:30pm  "

## 2025-01-20 NOTE — Progress Notes (Signed)
 " PROGRESS NOTE  [name] FMW:[id number] DOB: July 14, 1946   PCP: [name], MD  Patient is from: Home.  Lives with husband.  Uses rolling walker for mobility.  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    In ED, in A-fib with RVR with HR from 120s to 140s.  WBC 84.9 (baseline 40-50).  Hgb 10 (baseline 9-10).  Fecal Hemoccult positive.  CT abdomen and pelvis showed acute appendicitis with periappendiceal abscess, cholelithiasis with linear calcification of the distal CBD suspicious for choledocholithiasis.  General surgery consulted.  Patient was started on IV cefepime  and Flagyl , IV fluids and admitted for further care.  The next day, started on IV Cardizem .  Remained in RVR despite IV Cardizem .  Transitioned to IV amiodarone .  Cardiology following.  Repeat CT abdomen and pelvis on 1/21 without significant change.  Surgery consulted IR for possible periappendiceal abscess drainage, also recommended GI consult for anemia and possible choledocholithiasis.  She had drain placed by IR on 1/22.  Abscess culture with rare GNR.  Was on cefepime  and Flagyl .  Antibiotic escalated to ceftriaxone  and Flagyl .  GI consulted for anemia and possible choledocholithiasis but no plan for any procedure due to risk.  Patient was started on IV Lasix    Subjective: Seen and examined earlier this morning.  No major events overnight or this morning.   She reports vomiting after she had a coffee this morning.  Not sure about abdominal pain and shortness of breath.  She is somewhat confused this morning.  Does not recall few things.  Husband at bedside.  Assessment and plan: Acute appendicitis with periappendiceal abscess-presents with RLQ pain, nausea and vomiting.  Markedly elevated leukocytosis above baseline.  Repeat CT without significant change. -Deemed to be high risk for surgery with underlying comorbidity. -S/p drain placement for periappendiceal abscess on 1/22.  Gram stain GNR.  Culture multiple species.   -Continue IV cefepime  and Flagyl  1/19>> CTX and Flagyl  1/26>> -General Surgery and IR following.   Persistent A-fib with RVR: Remained in RVR on Cardizem  drip with soft blood pressure and transition to amiodarone  drip.  TTE with LVEF of 50 to 55%, moderate pericardial effusion, large pleural effusion in the left lateral region.  Pleural effusion small on subsequent chest x-rays.  Now rate controlled. - On amiodarone  and metoprolol  per cardiology - Eliquis  for anticoagulation -Optimize electrolytes.  Right lung pneumonia/infiltrate: Chest x-ray is concerning for progressive right lower and upper lobe infiltrate concerning for aspiration pneumonia although she is on antibiotics as above.  Reported difficulty swallowing to RN.  MRSA PCR screen negative.  She has no fever but worsening leukocytosis above baseline.  Pro-Cal elevated to 1.8.  proBNP elevated to 30,600. - Continue ceftriaxone  and Flagyl  - Aspiration precaution - SLP eval   Normocytic anemia: Patient reports melanotic stool.  Heme occult positive but H&H relatively at baseline.  Hgb stable after initial drop.  No overt bleeding.  GI consulted but no plan for procedure due to risk.  Anticoagulation resumed with IV heparin .  Hgb remained stable. Recent Labs  12/15/24 1057 01/12/25 1123 01/13/25 0615 01/14/25 0304 01/15/25 0830 01/16/25 0815 01/17/25 0649 01/18/25 0448 01/19/25 0530 01/20/25 0448  HGB 9.5* 10.4* 9.1* 8.6* 9.1* 9.7* 8.6* 9.4* 9.0* 8.8*  -Continue IV  Protonix . - Okay to switch anticoagulation to Eliquis   Acute on chronic HFmrEF: TTE  LVEF of 50 to 55% (45 to 50% in 2023), moderate pericardial effusion, large pleural effusion in the left lateral region. Overall, appears euvolemic except for edema.  Seems to be on p.o. Lasix  20 mg daily at home.  Some dyspnea and cough here.  Portable CXR concerning for CHF.  proBNP was 8000.  Started on IV Lasix  but developed AKI.  proBNP trended up to 30,600 despite diuretics. -Discontinue Lasix  pending cardiology input -Intake and output, daily weight, renal functions and electrolytes  AKI on CKD-3A: b/l Cr ~0.9.  AKI likely due to diuretics.  She was also on cefepime  Recent Labs    12/15/24 1057 01/12/25 1123 01/13/25 0615 01/14/25 0304 01/15/25 0830 01/16/25 0815 01/17/25 0649 01/18/25 0448 01/19/25 0530 01/20/25 0448  BUN 31* 21 16 17 12 11 11 12 18 22   CREATININE 1.48* 1.07* 0.95 0.98 0.89 0.99 1.02* 1.19* 1.64* 2.31*  - Discontinue IV Lasix . - Strict intake and output -Renally dose medications - Recheck in the morning.  Moderate pericardial effusion: Noted on initial echocardiogram.  Reported are small on repeat echocardiogram on 1/25. - Defer to cardiology  Small bilateral pleural effusion: Initially noted on CT and echocardiogram.  Subsequent x-rays with small pleural effusion.  Diuresed with IV Lasix  but developed AKI.  Cholelithiasis with possible choledocholithiasis: Noted on initial and repeat CT.  No RUQ tenderness. LFT within normal.  I wonder if this is some calcification versus true choledocholithiasis. - GI on board-no plan for procedure.  Acute metabolic encephalopathy/ataxia: This is likely due to gabapentin  and morphine  in the setting of AKI.  There could be some element of delirium as well. - Discontinue gabapentin , morphine  and Lasix  - Check encephalopathy labs including VBG, ammonia, TSH, B12, RPR  CMML with elevated WBC: Open we does.  Leukocytosis up to 129K.   30k-50k at baseline. - Discussed with oncology who recommended technologist smear to morning lab. -Follow recommendation by oncology.  Refractory hypocalcemia: Calcium  6.7 and corrects to 7.7 for hypoalbuminemia.  Remains low despite IV replacement.  Vitamin D  level within normal. - IV calcium  gluconate 2 g x 2 - Follow-up PTH  Physical deconditioning: Patient reports ambulating with walker at baseline. - PT/OT recommended CIR.  CIR following.  Chronic COPD: Stable. 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   Alcohol  use: No withdrawal symptoms. - Continue monitoring  Back pain: Likely from lying in bed continuously -Scheduled Tylenol  every 6 hours while awake -Continue p.o. oxycodone .  Discontinue IV morphine  -OOB, PT/OT  Hypokalemia/hypophosphatemia - Monitor replenish as appropriate  Thrombocytopenia: Mild.  Resolved.  Advance care planning: Full code with full scope of care.   See IPAL note on 1/23.  Patient seems to be declining.  Discussed my concern with patient's husband who voiced understanding. - Palliative on board.  Metabolic acidosis - Continue p.o. sodium bicarbonate   Moderate malnutrition Body mass index is 23.12 kg/m. 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 Wound 01/13/25 1900 Pressure Injury Buttocks Left;Lower Stage 1 -  Intact skin  with non-blanchable redness of a localized area usually over a bony prominence. (Active)    DVT prophylaxis:  SCDs Start: 01/12/25 1820  Code Status: Full code Family Communication: Updated patient's husband at bedside Level of care: Progressive Status is: Inpatient Remains inpatient appropriate because: Acute appendicitis with periappendiceal abscess,  A-fib with RVR, acute CHF, aspiration pneumonia and AKI   Final disposition: CIR   55 minutes with more than 50% spent in reviewing records, counseling patient/family and coordinating care.  Consultants:  General Surgery Cardiology Oncology/hematology Gastroenterology Palliative medicine  Procedures: None  Microbiology summarized: MRSA PCR screen negative Abscess culture with multiple species.  Objective: Vitals:   01/20/25 0500 01/20/25 0532 01/20/25 0806 01/20/25 0939  BP:  (!) 94/53 (!) 102/54   Pulse:  83 86   Resp:  19    Temp:  98.8 F (37.1 C)    TempSrc:  Oral    SpO2:  93%  94%  Weight: 55.5 kg     Height:        Examination:  GENERAL: No apparent distress.  Nontoxic. HEENT: MMM.  Vision and hearing grossly intact.  NECK: Supple.  No apparent JVD.  RESP:  No IWOB.  Initiation bilaterally. CVS: RRR at 80.  Heart sounds normal.  ABD/GI/GU: BS+.  No significant tenderness on exam. MSK/EXT:  Moves extremities. No apparent deformity.  Trace BLE edema.  NEURO: AA.  But confused.  Noted to have ataxia.  No apparent focal neuro deficit. PSYCH: Somewhat confused and anxious.  Sch Meds:  Scheduled Meds:  acetaminophen   650 mg Oral Q6H WA   amiodarone   200 mg Oral BID   arformoterol   15 mcg Nebulization BID   And   umeclidinium bromide   1 puff Inhalation Daily   Chlorhexidine  Gluconate Cloth  6 each Topical Daily   lactose free nutrition  237 mL Oral BID BM   metoprolol  tartrate  75 mg Oral BID   pantoprazole  (PROTONIX ) IV  40 mg Intravenous BID   polyethylene glycol  17 g Oral BID   predniSONE   5 mg Oral Q breakfast   sodium bicarbonate   650 mg Oral TID   sodium chloride  flush  10-40 mL Intracatheter Q12H   sodium chloride  flush  5 mL Intracatheter Q8H   Continuous Infusions:  albumin  human     calcium  gluconate 2,000 mg (01/20/25 0847)   cefTRIAXone  (ROCEPHIN )  IV Stopped (01/19/25 1429)   heparin  850 Units/hr (01/19/25 1845)   metronidazole  500  mg (01/20/25 0504)   PRN Meds:.ipratropium-albuterol , lip balm, ondansetron  **OR** ondansetron  (ZOFRAN ) IV, mouth rinse, oxyCODONE   Antimicrobials: Anti-infectives (From admission, onward)    Start     Dose/Rate Route Frequency Ordered Stop   01/19/25 1315  cefTRIAXone  (ROCEPHIN ) 2 g in sodium chloride  0.9 % 100 mL IVPB        2 g 200 mL/hr over 30 Minutes Intravenous Every 24 hours 01/19/25 1220     01/19/25 0600  metroNIDAZOLE  (FLAGYL ) IVPB 500 mg        500 mg 100 mL/hr over 60 Minutes Intravenous Every 12 hours 01/18/25 2003     01/14/25 0600  ceFEPIme  (MAXIPIME ) 2 g in sodium chloride  0.9 % 100 mL IVPB  Status:  Discontinued        2 g 200 mL/hr over 30 Minutes Intravenous Every 12 hours 01/13/25 1629 01/19/25 1220   01/12/25 1830  metroNIDAZOLE  (FLAGYL ) IVPB 500 mg  Status:  Discontinued        500 mg 100  mL/hr over 60 Minutes Intravenous Every 12 hours 01/12/25 1822 01/18/25 2003   01/12/25 1830  ceFEPIme  (MAXIPIME ) 2 g in sodium chloride  0.9 % 100 mL IVPB  Status:  Discontinued        2 g 200 mL/hr over 30 Minutes Intravenous Every 8 hours 01/12/25 1825 01/13/25 1629   01/12/25 1445  ceFEPIme  (MAXIPIME ) 2 g in sodium chloride  0.9 % 100 mL IVPB       Placed in And Linked Group   2 g 200 mL/hr over 30 Minutes Intravenous  Once 01/12/25 1436 01/12/25 1533   01/12/25 1445  metroNIDAZOLE  (FLAGYL ) IVPB 500 mg       Placed in And Linked Group   500 mg 100 mL/hr over 60 Minutes Intravenous  Once 01/12/25 1436 01/12/25 1606        I have personally reviewed the following labs and images: CBC: Recent Labs  Lab 01/14/25 0304 01/15/25 0830 01/16/25 0815 01/17/25 0649 01/18/25 0448 01/19/25 0530 01/20/25 0448  WBC 70.3*   < > 87.6* 80.1* 105.6* 117.6* 128.6*  NEUTROABS 44.3*  --   --   --  47.5*  --  56.0*  HGB 8.6*   < > 9.7* 8.6* 9.4* 9.0* 8.8*  HCT 26.8*   < > 32.0* 26.8* 30.5* 27.9* 28.1*  MCV 105.5*   < > 109.2* 104.7* 106.3* 104.9* 105.6*  PLT 149*   < > 172  174 202 227 251   < > = values in this interval not displayed.   BMP &GFR Recent Labs  Lab 01/14/25 0304 01/15/25 0830 01/16/25 0815 01/17/25 0649 01/18/25 0448 01/19/25 0530 01/20/25 0448  NA 136   < > 135 138 134* 136 135  K 3.8   < > 3.8 3.3* 4.4 3.9 4.0  CL 105   < > 106 107 105 107 104  CO2 24   < > 19* 19* 17* 17* 17*  GLUCOSE 100*   < > 92 107* 150* 80 73  BUN 17   < > 11 11 12 18 22   CREATININE 0.98   < > 0.99 1.02* 1.19* 1.64* 2.31*  CALCIUM  6.8*   < > 6.6* 7.0* 6.8* 6.5* 6.7*  MG 2.2   < > 2.1 2.1 1.9 1.8 1.9  PHOS 2.5  --  1.4* 3.2 2.0* 2.1*  --    < > = values in this interval not displayed.   Estimated Creatinine Clearance: 15.1 mL/min (A) (by C-G formula based on SCr of 2.31 mg/dL (H)). Liver & Pancreas: Recent Labs  Lab 01/14/25 0304 01/15/25 0830 01/16/25 0815 01/17/25 0649 01/18/25 0448 01/19/25 0530 01/20/25 0448  AST 19 19  --   --   --   --  32  ALT 10 9  --   --   --   --  12  ALKPHOS 86 82  --   --   --   --  120  BILITOT 0.4 0.4  --   --   --   --  0.3  PROT 5.0* 5.2*  --   --   --   --  4.9*  ALBUMIN  2.9* 2.8* 2.8* 3.1* 3.1* 2.8* 2.8*   No results for input(s): LIPASE, AMYLASE in the last 168 hours.  No results for input(s): AMMONIA in the last 168 hours. Diabetic: No results for input(s): HGBA1C in the last 72 hours. No results for input(s): GLUCAP in the last 168 hours. Cardiac Enzymes: No results for input(s): CKTOTAL, CKMB,  CKMBINDEX, TROPONINI in the last 168 hours. Recent Labs    01/17/25 1310 01/20/25 0448  PROBNP 8,319.0* 30,644.0*   Coagulation Profile: Recent Labs  Lab 01/15/25 0830  INR 1.8*   Thyroid  Function Tests: No results for input(s): TSH, T4TOTAL, FREET4, T3FREE, THYROIDAB in the last 72 hours. Lipid Profile: No results for input(s): CHOL, HDL, LDLCALC, TRIG, CHOLHDL, LDLDIRECT in the last 72 hours. Anemia Panel: No results for input(s): VITAMINB12, FOLATE,  FERRITIN, TIBC, IRON, RETICCTPCT in the last 72 hours. Urine analysis:    Component Value Date/Time   COLORURINE YELLOW 01/12/2025 1407   APPEARANCEUR CLEAR 01/12/2025 1407   LABSPEC 1.029 01/12/2025 1407   PHURINE 6.0 01/12/2025 1407   GLUCOSEU NEGATIVE 01/12/2025 1407   HGBUR SMALL (A) 01/12/2025 1407   BILIRUBINUR NEGATIVE 01/12/2025 1407   KETONESUR NEGATIVE 01/12/2025 1407   PROTEINUR NEGATIVE 01/12/2025 1407   UROBILINOGEN 0.2 04/28/2011 1859   NITRITE NEGATIVE 01/12/2025 1407   LEUKOCYTESUR NEGATIVE 01/12/2025 1407   Sepsis Labs: Invalid input(s): PROCALCITONIN, LACTICIDVEN  Microbiology: Recent Results (from the past 240 hours)  MRSA Next Gen by PCR, Nasal     Status: None   Collection Time: 01/13/25  7:03 PM   Specimen: Nasal Mucosa; Nasal Swab  Result Value Ref Range Status   MRSA by PCR Next Gen NOT DETECTED NOT DETECTED Final    Comment: (NOTE) The GeneXpert MRSA Assay (FDA approved for NASAL specimens only), is one component of a comprehensive MRSA colonization surveillance program. It is not intended to diagnose MRSA infection nor to guide or monitor treatment for MRSA infections. Test performance is not FDA approved in patients less than 71 years old. Performed at [facility], [address], [place], [place] [address]   Aerobic/Anaerobic Culture w Gram Stain (surgical/deep wound)     Status: Abnormal (Preliminary result)   Collection Time: 01/15/25 12:59 PM   Specimen: Abdomen; Abscess  Result Value Ref Range Status   Specimen Description   Final    ABDOMEN Performed at [facility], [address], [place], [place] [address]    Special Requests   Final    NONE Performed at [facility], [address] [place], [place] [address]    Gram Stain   Final    ABUNDANT WBC PRESENT, PREDOMINANTLY PMN RARE GRAM NEGATIVE RODS Performed at [facility], [address],  [place], [place] [address]    Culture (A)  Final    MULTIPLE ORGANISMS PRESENT, NONE PREDOMINANT NO ANAEROBES ISOLATED; CULTURE IN PROGRESS FOR 5 DAYS    Report Status PENDING  Incomplete  MRSA Next Gen by PCR, Nasal     Status: None   Collection Time: 01/19/25  6:47 PM   Specimen: Nasal Mucosa; Nasal Swab  Result Value Ref Range Status   MRSA by PCR Next Gen NOT DETECTED NOT DETECTED Final    Comment: (NOTE) The GeneXpert MRSA Assay (FDA approved for NASAL specimens only), is one component of a comprehensive MRSA colonization surveillance program. It is not intended to diagnose MRSA infection nor to guide or monitor treatment for MRSA infections. Test performance is not FDA approved in patients less than 65 years old. Performed at [facility], [address], [place], [place] [address]     Radiology Studies: DG CHEST PORT 1 VIEW Result Date: 01/20/2025 EXAM: 1 VIEW(S) XRAY OF THE CHEST 01/20/2025 05:58:00 AM COMPARISON: 01/19/2025 CLINICAL HISTORY: 79 year old female with volume overload. FINDINGS: LINES, TUBES AND  DEVICES: Right chest port with tip terminating over the superior cavoatrial junction, stable in place. LUNGS AND PLEURA: Small bilateral pleural effusions do not appear significantly changed, greater on the left. Stable bibasilar hypoventilation. Increasing confluence of widespread right upper lobe opacity radiating from the hilum. No air bronchograms at this time. No pneumothorax. No overt edema. HEART AND MEDIASTINUM: Aortic arch atherosclerosis. No acute abnormality of the cardiac and mediastinal silhouettes. BONES AND SOFT TISSUES: Right shoulder arthroplasty noted. No acute osseous abnormality. ABDOMEN: Paucity of visible bowel gas. IMPRESSION: 1. Small pleural effusions with stable bibasilar hypoventilation. Progressing right upper lobe opacity suspicious for worsening pneumonia. Electronically signed by: Helayne Hurst MD 01/20/2025 06:02 AM EST RP Workstation:  [id number]   DG Chest 2 View Result Date: 01/19/2025 EXAM: 2 VIEW(S) XRAY OF THE CHEST 01/19/2025 05:09:00 PM COMPARISON: 01/17/2025 CLINICAL HISTORY: Pleural effusion. FINDINGS: LINES, TUBES AND DEVICES: Right chest port in place with tip terminating over the superior cavoatrial junction. LUNGS AND PLEURA: Small bilateral pleural effusions. Increased interstitial markings throughout the right lung and left lung base. Infiltrates in the right base, likely pneumonia. Aspiration would also be a possibility. Right base infiltrates have progressed since the prior study. No pneumothorax. HEART AND MEDIASTINUM: Atherosclerotic calcifications. No acute abnormality of the cardiac and mediastinal silhouettes. BONES AND SOFT TISSUES: Status post right shoulder arthroplasty. No acute osseous abnormality. IMPRESSION: 1. Small bilateral pleural effusions. 2. Progressive right basilar infiltrates, likely pneumonia. Aspiration is also a possibility. Electronically signed by: Elsie Gravely MD 01/19/2025 05:18 PM EST RP Workstation: [id number]         [name] T. [name] [facility]  If 7PM-7AM, please contact night-coverage [email or web address] 01/20/2025, 9:52 AM   "

## 2025-01-21 ENCOUNTER — Other Ambulatory Visit: Payer: Self-pay

## 2025-01-21 ENCOUNTER — Other Ambulatory Visit: Payer: Self-pay | Admitting: Hematology

## 2025-01-21 DIAGNOSIS — Z515 Encounter for palliative care: Secondary | ICD-10-CM

## 2025-01-21 DIAGNOSIS — G9341 Metabolic encephalopathy: Secondary | ICD-10-CM | POA: Insufficient documentation

## 2025-01-21 DIAGNOSIS — K3533 Acute appendicitis with perforation and localized peritonitis, with abscess: Secondary | ICD-10-CM | POA: Diagnosis not present

## 2025-01-21 DIAGNOSIS — N179 Acute kidney failure, unspecified: Secondary | ICD-10-CM | POA: Insufficient documentation

## 2025-01-21 DIAGNOSIS — R270 Ataxia, unspecified: Secondary | ICD-10-CM

## 2025-01-21 DIAGNOSIS — Z66 Do not resuscitate: Secondary | ICD-10-CM | POA: Insufficient documentation

## 2025-01-21 LAB — AEROBIC/ANAEROBIC CULTURE W GRAM STAIN (SURGICAL/DEEP WOUND)

## 2025-01-21 LAB — SYPHILIS: RPR W/REFLEX TO RPR TITER AND TREPONEMAL ANTIBODIES, TRADITIONAL SCREENING AND DIAGNOSIS ALGORITHM: RPR Ser Ql: NONREACTIVE

## 2025-01-25 NOTE — Death Summary Note (Signed)
 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, 41-49%) (HCC)   Atrial fibrillation with rapid ventricular response (HCC)   CMML (chronic myelomonocytic leukemia) (HCC)   Stage 2 moderate COPD by GOLD classification (HCC)   Bilateral pleural effusion   Chronic kidney disease, stage 3a (HCC)   Protein-calorie malnutrition, moderate   Rheumatoid arthritis (HCC)   Appendicitis   Advance care planning   Pressure injury of skin   Current use of long term anticoagulation   Pericardial effusion   Hyperlipidemia   Pleural effusion   Atrial fibrillation/flutter (HCC)   Paroxysmal atrial fibrillation with RVR (HCC)   Hypocalcemia   Hypoalbuminemia   Pneumonia due to infectious organism   End of life care   DNR (do not resuscitate)   Hospital Course: 79 year old F with PMH of RA on biologic, CMML on chemo, A-fib on Eliquis , HFmrEF, COPD, CKD-3A, PVD, lung nodules and alcohol  use presented to ED with RLQ abdominal pain, distention, melanotic stool, nausea and vomiting for 1 to 2 days, and admitted with acute appendicitis with periappendiceal abscess, A-fib with RVR and possible choledocholithiasis.    In ED, in A-fib with RVR with HR from 120s to 140s.  WBC 84.9 (baseline 40-50).  Hgb 10 (baseline 9-10).  Fecal Hemoccult positive.  CT abdomen and pelvis showed acute appendicitis with periappendiceal abscess, cholelithiasis with linear calcification of the distal CBD suspicious for choledocholithiasis.  General  surgery consulted.  Patient was started on IV cefepime  and Flagyl , IV fluids and admitted for further care.   The next day, started on IV Cardizem .  Remained in RVR despite IV Cardizem .  Transitioned to IV amiodarone .  Cardiology following.   Repeat CT abdomen and pelvis on 1/21 without significant change.  Surgery consulted IR for possible periappendiceal abscess drainage, also recommended GI consult for anemia and possible choledocholithiasis.   She had drain placed by IR on 1/22.  Abscess culture with rare GNR.  Was on cefepime  and Flagyl .  Antibiotic escalated to ceftriaxone  and Flagyl .   GI consulted for anemia and possible choledocholithiasis but no plan for any procedure due to risk.   Patient was started on IV Lasix  by cardiology but developed AKI.  She also developed acute metabolic encephalopathy with ataxia.   After goals of care discussion with patient's husband, patient was transition to full comfort care on February 05, 2025 and passed away on 79-Feb-2026.   Assessment and Plan: End-of-life care/full comfort care: Started on 2025/02/05 after discussion with patient's husband. -Appreciate help by palliative medicine   Acute appendicitis with periappendiceal abscess-presents with RLQ pain, nausea and vomiting.  Markedly elevated leukocytosis above baseline.  Repeat CT without significant change. -Deemed to be high risk for surgery with underlying comorbidity. -S/p drain placement for periappendiceal abscess on 1/22.  Gram stain GNR.  Culture multiple species.   -Continue IV cefepime  and Flagyl  1/19>> CTX and Flagyl  1/26-1/27 -Appreciate elbow surgery and IR.   Persistent A-fib with RVR: Remained in RVR on Cardizem  drip with soft blood pressure and transition to amiodarone  drip.  TTE with LVEF of 50 to 55%, moderate pericardial effusion, large pleural effusion in the left lateral region.  Pleural effusion small on subsequent chest x-rays.  Now rate controlled. - On amiodarone , metoprolol   and Eliquis  per cardiology   Right lung pneumonia/infiltrate: Chest x-ray is concerning for progressive right lower and upper lobe infiltrate concerning for aspiration pneumonia although she is on antibiotics as above.  Reported difficulty swallowing to RN.  MRSA PCR screen negative.  She has no fever but worsening leukocytosis above baseline.  Pro-Cal elevated to 1.8.  proBNP elevated to 30,600. - Received antibiotics and Lasix  as above.     Normocytic anemia: Patient reports melanotic stool.  Heme occult positive but H&H relatively at baseline.  Hgb stable after initial drop.  No overt bleeding.  GI consulted but no plan for procedure due to risk.  Anticoagulation resumed with IV heparin .  Hgb remained stable.    Acute on chronic HFmrEF: TTE  LVEF of 50 to 55% (45 to 50% in 2023), moderate pericardial effusion, large pleural effusion in the left lateral region. Overall, appears euvolemic except for edema.  Seems to be on p.o. Lasix  20 mg daily at home.  Some dyspnea and cough here.  Portable CXR concerning for CHF.  proBNP was 8000.  Started on IV Lasix  but developed AKI.  proBNP trended up to 30,600 despite diuretics. - Received diuretics per cardiology.   AKI on CKD-3A: b/l Cr ~0.9.  AKI likely due to diuretics.  She was also on cefepime   Moderate pericardial effusion: Noted on initial echocardiogram.  Reported are small on repeat echocardiogram on 1/25.   Small bilateral pleural effusion: Initially noted on CT and echocardiogram.  Subsequent x-rays with small pleural effusion.  Diuresed with IV Lasix  but developed AKI.   Cholelithiasis with possible choledocholithiasis: Noted on initial and repeat CT.  No RUQ tenderness. LFT within normal.  I wonder if this is some calcification versus true choledocholithiasis. - GI on board-no plan for procedure.   Acute metabolic encephalopathy/ataxia: This is likely due to gabapentin  and morphine  in the setting of AKI.  There could be some element of  delirium as well. - Discontinue gabapentin , morphine  and Lasix    CMML with elevated WBC: Open we does.  Leukocytosis up to 129K.  30k-50k at baseline. -Discussed with oncology who recommended technologist smear to morning lab.  - Per oncology, no concern for acute leukemia.   Refractory hypocalcemia: Calcium  6.7 and corrects to 7.7 for hypoalbuminemia.  Remains low despite IV replacement.  Vitamin D  level within normal.  PTH elevated to 124 suggesting secondary hyperparathyroidism.    Physical deconditioning: Patient reports ambulating with walker at baseline. - PT/OT recommended CIR.     Chronic COPD: Stable.    History of RA on biologic    Alcohol  use: No withdrawal symptoms.    Back pain: Likely from lying in bed continuously -Scheduled Tylenol  every 6 hours while awake -Continue p.o. oxycodone .  Discontinue IV morphine  given AKI    Hypokalemia/hypophosphatemia resolved   Thrombocytopenia: Mild.  Resolved.   Metabolic acidosis - Continue p.o. sodium bicarbonate    Moderate malnutrition        Procedures: Drain placement by IR for periappendiceal abscess  Consultations: General surgery, IR, cardiology, hematology/oncology and palliative medicine  The results of significant diagnostics from this hospitalization (including imaging, microbiology, ancillary and laboratory) are listed below for reference.   Significant Diagnostic Studies: US  RENAL Result Date: 01/20/2025 EXAM: RETROPERITONEAL ULTRASOUND OF THE KIDNEYS 01/20/2025 12:07:49 PM TECHNIQUE:  Real-time ultrasonography of the retroperitoneum, specifically the kidneys and urinary bladder, was performed. COMPARISON: US  Renal 04/27/2011. CLINICAL HISTORY: Acute renal injury. FINDINGS: RIGHT KIDNEY: Right kidney measures 9.0 x 4.8 x 5.2 cm. The calculated volume is 118 ml. A 3.6 cm simple cyst is noted within the lower pole of the right kidney. Normal cortical echogenicity. No hydronephrosis. No calculus. No mass.  LEFT KIDNEY: Left kidney measures 9.3 x 4.7 x 4.4 cm. The calculated volume is 101 ml. A 1.8 cm simple cyst is noted within the left kidney in the mid pole region. Normal cortical echogenicity. No hydronephrosis. No calculus. No mass. BLADDER: Unremarkable appearance of the bladder. Incidental note is made of left pleural effusion. IMPRESSION: 1. Bilateral renal cystic change. No follow-up is recommended. Electronically signed by: Oneil Devonshire MD 01/20/2025 08:38 PM EST RP Workstation: [id number]   DG CHEST PORT 1 VIEW Result Date: 01/20/2025 EXAM: 1 VIEW(S) XRAY OF THE CHEST 01/20/2025 05:58:00 AM COMPARISON: 01/19/2025 CLINICAL HISTORY: 79 year old female with volume overload. FINDINGS: LINES, TUBES AND DEVICES: Right chest port with tip terminating over the superior cavoatrial junction, stable in place. LUNGS AND PLEURA: Small bilateral pleural effusions do not appear significantly changed, greater on the left. Stable bibasilar hypoventilation. Increasing confluence of widespread right upper lobe opacity radiating from the hilum. No air bronchograms at this time. No pneumothorax. No overt edema. HEART AND MEDIASTINUM: Aortic arch atherosclerosis. No acute abnormality of the cardiac and mediastinal silhouettes. BONES AND SOFT TISSUES: Right shoulder arthroplasty noted. No acute osseous abnormality. ABDOMEN: Paucity of visible bowel gas. IMPRESSION: 1. Small pleural effusions with stable bibasilar hypoventilation. Progressing right upper lobe opacity suspicious for worsening pneumonia. Electronically signed by: Helayne Hurst MD 01/20/2025 06:02 AM EST RP Workstation: [id number]   DG Chest 2 View Result Date: 01/19/2025 EXAM: 2 VIEW(S) XRAY OF THE CHEST 01/19/2025 05:09:00 PM COMPARISON: 01/17/2025 CLINICAL HISTORY: Pleural effusion. FINDINGS: LINES, TUBES AND DEVICES: Right chest port in place with tip terminating over the superior cavoatrial junction. LUNGS AND PLEURA: Small bilateral pleural effusions.  Increased interstitial markings throughout the right lung and left lung base. Infiltrates in the right base, likely pneumonia. Aspiration would also be a possibility. Right base infiltrates have progressed since the prior study. No pneumothorax. HEART AND MEDIASTINUM: Atherosclerotic calcifications. No acute abnormality of the cardiac and mediastinal silhouettes. BONES AND SOFT TISSUES: Status post right shoulder arthroplasty. No acute osseous abnormality. IMPRESSION: 1. Small bilateral pleural effusions. 2. Progressive right basilar infiltrates, likely pneumonia. Aspiration is also a possibility. Electronically signed by: Elsie Gravely MD 01/19/2025 05:18 PM EST RP Workstation: [id number]   ECHOCARDIOGRAM LIMITED Result Date: 01/18/2025    ECHOCARDIOGRAM LIMITED REPORT   Patient Name:   CAMIYAH FRIBERG [facility] Date of Exam: 01/18/2025 Medical Rec #:  [id number]        Height:       61.0 in Accession #:    [id number]       Weight:       105.2 lb Date of Birth:  06/28/1946       BSA:          1.437 m Patient Age:    78 years         BP:           119/64 mmHg Patient Gender: F                HR:           77 bpm. Exam Location:  Inpatient Procedure: Cardiac Doppler, Limited  Echo and Limited Color Doppler (Both            Spectral and Color Flow Doppler were utilized during procedure). Indications:    Pericardial effusion I31.3  History:        Patient has prior history of Echocardiogram examinations, most                 recent 01/15/2025. Risk Factors:Hypertension.  Sonographer:    Sydnee [name] RDCS Referring Phys: [id number] FRANCK H AZOBOU TONLEU IMPRESSIONS  1. Left ventricular ejection fraction, by estimation, is 55 to 60%. Left ventricular ejection fraction by 2D MOD biplane is 57.1 %. The left ventricle has normal function.  2. A small pericardial effusion is present. The pericardial effusion is posterior to the left ventricle. Large pleural effusion in the left lateral region.  3. Aortic valve  sclerosis/calcification is present, without any evidence of aortic stenosis. Comparison(s): Prior images reviewed side by side. There remains a large pleural effusion but pericardial effusion is now small (best seen in subcostal view). FINDINGS  Left Ventricle: Left ventricular ejection fraction, by estimation, is 55 to 60%. Left ventricular ejection fraction by 2D MOD biplane is 57.1 %. The left ventricle has normal function. Pericardium: A small pericardial effusion is present. The pericardial effusion is posterior to the left ventricle. Aortic Valve: Aortic valve sclerosis/calcification is present, without any evidence of aortic stenosis. Additional Comments: There is a large pleural effusion in the left lateral region.   LV Volumes (MOD)               Biplane EF (MOD) LV vol d, MOD    57.2 ml       LV Biplane EF:   Left A2C:                                            ventricular LV vol d, MOD    58.2 ml                        ejection A4C:                                            fraction by LV vol s, MOD    24.1 ml                        2D MOD A2C:                                            biplane is LV vol s, MOD    23.6 ml                        57.1 %. A4C: LV SV MOD A2C:   33.1 ml LV SV MOD A4C:   58.2 ml LV SV MOD BP:    33.0 ml Franck Azobou Tonleu Electronically signed by Joelle Ren Ny Signature Date/Time: 01/18/2025/11:12:50 AM    Final    DG Chest Port 1 View Result Date: 01/17/2025 EXAM: 1 VIEW(S) XRAY OF THE CHEST 01/17/2025 08:26:32 AM COMPARISON: 11/12/2023 CLINICAL HISTORY:  Shortness of breath. FINDINGS: LINES, TUBES AND DEVICES: Right chest port with catheter tip at proximal right atrium. LUNGS AND PLEURA: New small bilateral pleural effusions, left greater than right. New bilateral interstitial opacities, and airspace opacities in the lung bases left worse than right. No pneumothorax. HEART AND MEDIASTINUM: Mild cardiomegaly. Aortic arch atherosclerosis. No acute abnormality of the  cardiac and mediastinal silhouettes. BONES AND SOFT TISSUES: Stable right shoulder arthroplasty. No acute osseous abnormality. IMPRESSION: 1. New bilateral interstitial and airspace opacities in the lung bases, left worse than right. 2. New small bilateral pleural effusions, left greater than right. 3. Mild cardiomegaly. Electronically signed by: Katheleen Faes MD 01/17/2025 03:24 PM EST RP Workstation: [id number]   CT GUIDED PERITONEAL/RETROPERITONEAL FLUID DRAIN BY PERC CATH Result Date: 01/15/2025 INDICATION: 79 year old female with right lower quadrant fluid collection concerning for abscess, likely perforated appendicitis. EXAM: CT PERC DRAIN PERITONEAL ABCESS COMPARISON:  01/14/2025 MEDICATIONS: The patient is currently admitted to the hospital and receiving intravenous antibiotics. The antibiotics were administered within an appropriate time frame prior to the initiation of the procedure. ANESTHESIA/SEDATION: Moderate (conscious) sedation was employed during this procedure. A total of Versed  2 mg and Fentanyl  100 mcg was administered intravenously. Moderate Sedation Time: 11 minutes. The patient's level of consciousness and vital signs were monitored continuously by radiology nursing throughout the procedure under my direct supervision. CONTRAST:  None COMPLICATIONS: None immediate. PROCEDURE: RADIATION DOSE REDUCTION: This exam was performed according to the departmental dose-optimization program which includes automated exposure control, adjustment of the mA and/or kV according to patient size and/or use of iterative reconstruction technique. Informed written consent was obtained from the patient after a discussion of the risks, benefits and alternatives to treatment. The patient was placed supine on the CT gantry and a pre procedural CT was performed re-demonstrating the known abscess/fluid collection within the right lower quadrant. The procedure was planned. A timeout was performed prior to the  initiation of the procedure. The right lower quadrant was prepped and draped in the usual sterile fashion. The overlying soft tissues were anesthetized with 1% lidocaine  with epinephrine . Appropriate trajectory was planned with the use of a 22 gauge spinal needle. An 18 gauge trocar needle was advanced into the abscess/fluid collection and a short Amplatz super stiff wire was coiled within the collection. Appropriate positioning was confirmed with a limited CT scan. The tract was serially dilated allowing placement of a 8 French mini loop drainage catheter. Appropriate positioning was confirmed with a limited postprocedural CT scan. Twenty ml of purulent fluid was aspirated. The tube was connected to a bulb suction and sutured in place. A dressing was placed. The patient tolerated the procedure well without immediate post procedural complication. IMPRESSION: Successful CT guided placement of a 8 French mini loop drain catheter into the periappendiceal abscess with aspiration of 20 mL of purulent fluid. Samples were sent to the laboratory as requested by the ordering clinical team. Ester Sides, MD [facility] Electronically Signed   By: Ester Sides M.D.   On: 01/15/2025 13:47   ECHOCARDIOGRAM COMPLETE Result Date: 01/15/2025    ECHOCARDIOGRAM REPORT   Patient Name:   TYREA FROBERG Date of Exam: 01/15/2025 Medical Rec #:  [id number]        Height:       61.0 in Accession #:    [id number]       Weight:       105.2 lb Date of Birth:  1946/06/06  BSA:          1.437 m Patient Age:    78 years         BP:           99/53 mmHg Patient Gender: F                HR:           97 bpm. Exam Location:  Inpatient Procedure: 2D Echo, Cardiac Doppler and Color Doppler (Both Spectral and Color            Flow Doppler were utilized during procedure). Indications:    Atrial Flutter  History:        Patient has prior history of Echocardiogram examinations, most                  recent 03/30/2022. COPD; Arrythmias:Atrial Fibrillation.  Sonographer:    Philomena Daring Referring Phys: [id number] ZANE ADAMS IMPRESSIONS  1. Left ventricular ejection fraction, by estimation, is 50 to 55%. The left ventricle has low normal function. The left ventricle has no regional wall motion abnormalities. Left ventricular diastolic function could not be evaluated.  2. Right ventricular systolic function is low normal. The right ventricular size is normal. Mildly increased right ventricular wall thickness. There is normal pulmonary artery systolic pressure.  3. Moderate size pericardial effusion (best seen subcostal view, posterior to the LV, respiratory variations across the MV and TV not obtained, IVC is dilated but compressible, not suggestive of tamponade physiology, clinical correlation required). Large pleural effusion in the left lateral region.  4. The mitral valve is degenerative. Mild mitral valve regurgitation. No evidence of mitral stenosis.  5. The aortic valve was not well visualized. Unable to determine aortic valve morphology due to image quality. Aortic valve regurgitation is not visualized. No aortic stenosis is present.  6. The inferior vena cava is dilated in size with >50% respiratory variability, suggesting right atrial pressure of 8 mmHg. Comparison(s): A prior study was performed on 03/30/2022. LVEF 45-50%, RV function mildly reduced, mild/moderate MR, estimated RAP . Pericardial and pleural effusion are new compared to prior study. Conclusion(s)/Recommendation(s): Consider repeat limited echo in 24-48hrs to evaluate the pericaridal effusion and obtain images to evaluate for tamponade physiology if clinically indicated. FINDINGS  Left Ventricle: Left ventricular ejection fraction, by estimation, is 50 to 55%. The left ventricle has low normal function. The left ventricle has no regional wall motion abnormalities. The left ventricular internal cavity size was small. There is  no left ventricular hypertrophy. Left ventricular diastolic function could not be evaluated due to atrial fibrillation. Left ventricular diastolic function could not be evaluated. Right Ventricle: The right ventricular size is normal. Mildly increased right ventricular wall thickness. Right ventricular systolic function is low normal. There is normal pulmonary artery systolic pressure. The tricuspid regurgitant velocity is 2.63 m/s, and with an assumed right atrial pressure of 8 mmHg, the estimated right ventricular systolic pressure is 35.7 mmHg. Left Atrium: Left atrial size was normal in size. Right Atrium: Right atrial size was normal in size. Pericardium: Moderate size pericardial effusion (best seen subcostal view, posterior to the LV, respiratory variations across the MV and TV not obtained, IVC is dilated but compressible, not suggestive of tamponade physiology, clinical correlation required). Mitral Valve: The mitral valve is degenerative in appearance. Mild mitral valve regurgitation. No evidence of mitral valve stenosis. Tricuspid Valve: The tricuspid valve is grossly normal. Tricuspid valve regurgitation is mild . No evidence of tricuspid stenosis. Aortic  Valve: The aortic valve was not well visualized. Aortic valve regurgitation is not visualized. No aortic stenosis is present. Pulmonic Valve: The pulmonic valve was not well visualized. Pulmonic valve regurgitation is not visualized. Aorta: The aortic root and ascending aorta are structurally normal, with no evidence of dilitation. Venous: The inferior vena cava is dilated in size with greater than 50% respiratory variability, suggesting right atrial pressure of 8 mmHg. IAS/Shunts: The atrial septum is grossly normal. Additional Comments: There is a large pleural effusion in the left lateral region.  LEFT VENTRICLE PLAX 2D LVIDd:         3.20 cm   Diastology LVIDs:         2.30 cm   LV e' medial:    8.12 cm/s LV PW:         0.80 cm   LV E/e' medial:   13.6 LV IVS:        0.90 cm   LV e' lateral:   10.47 cm/s LVOT diam:     1.80 cm   LV E/e' lateral: 10.6 LV SV:         39 LV SV Index:   27 LVOT Area:     2.54 cm  RIGHT VENTRICLE             IVC RV S prime:     10.10 cm/s  IVC diam: 2.10 cm TAPSE (M-mode): 1.4 cm LEFT ATRIUM             Index        RIGHT ATRIUM           Index LA diam:        3.40 cm 2.37 cm/m   RA Area:     17.10 cm LA Vol (A2C):   40.2 ml 27.97 ml/m  RA Volume:   40.30 ml  28.04 ml/m LA Vol (A4C):   38.4 ml 26.72 ml/m LA Biplane Vol: 41.8 ml 29.08 ml/m  AORTIC VALVE LVOT Vmax:   104.00 cm/s LVOT Vmean:  67.000 cm/s LVOT VTI:    0.155 m  AORTA Ao Root diam: 2.30 cm Ao Asc diam:  2.30 cm MITRAL VALVE                TRICUSPID VALVE MV Area (PHT): 8.34 cm     TR Peak grad:   27.7 mmHg MV Decel Time: 91 msec      TR Vmax:        263.00 cm/s MV E velocity: 110.67 cm/s                             SHUNTS                             Systemic VTI:  0.16 m                             Systemic Diam: 1.80 cm Sunit Tolia Electronically signed by Madonna Large Signature Date/Time: 01/15/2025/10:48:44 AM    Final    CT ABDOMEN PELVIS W CONTRAST Result Date: 01/14/2025 EXAM: CT ABDOMEN AND PELVIS WITH CONTRAST 01/14/2025 03:25:20 PM TECHNIQUE: CT of the abdomen and pelvis was performed with the administration of 80 mL iohexol  (OMNIPAQUE ) 300 MG/ML solution. Multiplanar reformatted images are provided for review. Automated exposure control, iterative reconstruction, and/or weight-based adjustment of the mA/kV  was utilized to reduce the radiation dose to as low as reasonably achievable. COMPARISON: 01/12/2025 CLINICAL HISTORY: RLQ abdominal pain FINDINGS: LOWER CHEST: Similar moderate volume bilateral pleural effusions. Similar appearance of the enhancing ovoid mass in the right lung base in the extrapleural fat measuring 13 x 27 mm. Similar compressive atelectasis also noted in the lung bases. Partially visualized central venous catheter terminating at  the cavoatrial junction. LIVER: Unchanged cyst in the left hepatic lobe. Mild dilation of the intrahepatic biliary ducts. GALLBLADDER AND BILE DUCTS: Punctate radiopaque gallstones. No wall thickening. Minimal dilation of the intrahepatic biliary ducts with a 5 mm calculus layering in the distal common bile duct (axial 45). SPLEEN: No acute abnormality. PANCREAS: No acute abnormality. ADRENAL GLANDS: No acute abnormality. KIDNEYS, URETERS AND BLADDER: Similar appearance of multiple bilateral renal cysts. No stones in the kidneys or ureters. No hydronephrosis. No perinephric or periureteral stranding. The urinary bladder is distended without focal abnormality. GI AND BOWEL: Decompressed stomach. Ampullary duodenal diverticulum measuring 1.9 cm. Decompressed appendix with mucosal enhancement. Similar appearance of the peripherally enhancing fluid collection centered about the distal appendix, measuring 3.2 x 4.5 x 3.9 cm. Extensive descending and sigmoid colonic diverticulosis. No changes of acute diverticulitis. No extravasation of enteric contrast to suggest bowel perforation. There is no bowel obstruction. PERITONEUM AND RETROPERITONEUM: No ascites. No free air. Similar appearance of the large multilobular fat containing masses in the presacral space. VASCULATURE: Aorta is normal in caliber. Diffuse aortoiliac atherosclerosis. LYMPH NODES: No lymphadenopathy. REPRODUCTIVE ORGANS: Age related atrophy of the uterus and ovaries. BONES AND SOFT TISSUES: Diffuse osteopenia. Moderate dextral curvature of the thoracolumbar spine with superimposed multilevel degenerative disc disease. Mild anasarca. No focal soft tissue abnormality. IMPRESSION: 1. Stable periappendiceal abscess centered about the distal appendix, measuring 3.2 x 4.5 x 3.9 cm. 2. 5 mm linear calcification again noted in the distal CBD, most likely reflecting choledocholithiasis. Minimal intrahepatic biliary ductal dilation present, correlation with serum  bilirubin recommended. 3. Similar appearance of the moderate bilateral pleural effusions. Electronically signed by: Rogelia Myers MD 01/14/2025 04:05 PM EST RP Workstation: [id number]   CT ABDOMEN PELVIS W CONTRAST Result Date: 01/12/2025 EXAM: CT ABDOMEN AND PELVIS WITH CONTRAST 01/12/2025 12:46:02 PM TECHNIQUE: CT of the abdomen and pelvis was performed with the administration of 100 mL of iohexol  (OMNIPAQUE ) 300 MG/ML solution. Multiplanar reformatted images are provided for review. Automated exposure control, iterative reconstruction, and/or weight-based adjustment of the mA/kV was utilized to reduce the radiation dose to as low as reasonably achievable. COMPARISON: 11/12/2023 CLINICAL HISTORY: Nausea, vomiting, and weakness for 3 days. History of chronic myelomonocytic leukemia. * Tracking Code: BO * FINDINGS: LOWER CHEST: Stable appearance of lower thoracic paraspinal and bilateral posterior pleural based masses, the posterior pleural based mass is a short axis diameter of 1.3 cm, previously 1.2 cm. Small bilateral pleural effusions with passive atelectasis. Cardiomegaly noted with mitral valve calcifications. Emphysema. LIVER: Scattered hypodense hepatic lesions favoring cysts, the largest is in the lateral segment left hepatic lobe with fluid density and measuring 1.5 cm in long axis. GALLBLADDER AND BILE DUCTS: The dependent density in the gallbladder suspicious for small gallstone, image 28 series 2. 5 mm linear calcification posteriorly in the distal common bile duct suspicious for choledocholithiasis, image 33 series 2 and image 80 series 10. The common bile duct measures 6 mm in diameter which is within normal limits and similar to prior. No intrahepatic biliary dilatation. SPLEEN: No acute abnormality. PANCREAS: No acute abnormality. ADRENAL GLANDS: No acute abnormality.  KIDNEYS, URETERS AND BLADDER: In addition to bilateral Bosniak category 1 renal cysts there are multiple hypodense renal lesions  which are likely cysts but are technically too small to characterize. These lesions do not require further imaging workup. No stones in the kidneys or ureters. No hydronephrosis. No perinephric or periureteral stranding. Urinary bladder is unremarkable. GI AND BOWEL: Abnormal pericecal collection in the expected vicinity of the appendix measuring 4.8 x 2.7 x 2.6 cm, not readily separable from the appendix, suspicious for appendicitis possibly with contained periappendiceal abscess. Appendiceal mass is a less likely differential diagnostic consideration given the unremarkable appearance of the appendix on 11/12/2023. Correlate with the patient's symptoms. There is a mild stranding around this lesion indicating inflammation. Sigmoid colon diverticulosis. Scattered diverticula elsewhere in the descending colon. Stomach demonstrates no acute abnormality. There is no bowel obstruction. PERITONEUM AND RETROPERITONEUM: No ascites. No free air. VASCULATURE: Systemic atherosclerosis is present, including the aorta and iliac arteries. Aorta is normal in caliber. LYMPH NODES: No lymphadenopathy. REPRODUCTIVE ORGANS: No acute abnormality. 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 Sclerosis in the sacrum likely attributable to remote sacral insufficiency fracture. Extra convex lower thoracic and upper lumbar scoliosis. Right foraminal impingement of L4-L5 and L5-S1 primarily from spondylosis. IMPRESSION: 1. Abnormal pericecal collection inseparable from the appendix, suspicious for acute appendicitis with contained periappendiceal abscess; surgical consultation is recommended. 2. Linear calcification in the distal common bile duct suspicious for choledocholithiasis. 3.  Cholelithiasis. 4. Similar appearance of presacral fatty masses, favored extramedullary hematopoiesis. 5. Stable lower thoracic paraspinal and bilateral posterior pleural-based masses, likewise favoring extramedullary hematopoiesis. 6. Small bilateral pleural effusions with passive atelectasis. 7. Emphysema. 8. Cardiomegaly with mitral valve calcifications. 9. Sigmoid colon diverticulosis. 10. Systemic atherosclerosis. 11. Sclerosis in the sacrum likely from remote sacral insufficiency fracture. 12. Lower thoracic and upper lumbar scoliosis. 13. Spondylosis with right foraminal impingement at L4-5 and L5-S1. Electronically signed by: Ryan Salvage MD 01/12/2025 02:30 PM EST RP Workstation: [id number]    Microbiology: Recent Results (from the past 240 hours)  MRSA Next Gen by PCR, Nasal     Status: None   Collection Time: 01/13/25  7:03 PM   Specimen: Nasal Mucosa; Nasal Swab  Result Value Ref Range Status   MRSA by PCR Next Gen NOT DETECTED NOT DETECTED Final    Comment: (NOTE) The GeneXpert MRSA Assay (FDA approved for NASAL specimens only), is one component of a comprehensive MRSA colonization surveillance program. It is not intended to diagnose MRSA infection nor to guide or monitor treatment for MRSA infections. Test performance is not FDA approved in patients less than 56 years old. Performed at [facility], [address] [place], [place] [address]   Aerobic/Anaerobic Culture w Gram Stain (surgical/deep wound)     Status: Abnormal (Preliminary result)   Collection Time: 01/15/25 12:59 PM   Specimen: Abdomen; Abscess  Result Value Ref Range Status   Specimen Description   Final    ABDOMEN Performed at [facility], [address], [place], [place] [address]    Special Requests   Final    NONE Performed at [facility], [address] [place], [place] [address]    Gram Stain   Final    ABUNDANT WBC PRESENT,  PREDOMINANTLY PMN RARE GRAM NEGATIVE RODS  Culture (A)  Final    MULTIPLE ORGANISMS PRESENT, NONE PREDOMINANT HOLDING FOR POSSIBLE ANAEROBE Performed at [facility], [address], [place], [place] [address]    Report Status PENDING  Incomplete  MRSA Next Gen by PCR, Nasal     Status: None   Collection Time: 01/19/25  6:47 PM   Specimen: Nasal Mucosa; Nasal Swab  Result Value Ref Range Status   MRSA by PCR Next Gen NOT DETECTED NOT DETECTED Final    Comment: (NOTE) The GeneXpert MRSA Assay (FDA approved for NASAL specimens only), is one component of a comprehensive MRSA colonization surveillance program. It is not intended to diagnose MRSA infection nor to guide or monitor treatment for MRSA infections. Test performance is not FDA approved in patients less than 63 years old. Performed at [facility], [address] [place], [place] [address]     Time spent: 35 minutes  Signed: Mignon ONEIDA Bump, MD 18-Feb-2025   "

## 2025-01-25 NOTE — Progress Notes (Addendum)
" ° °  Reviewed patients chart and patient/family have decided to pursue a palliative approach. Will discuss with Dr Luane but suspect cardiology will sign off at this point.   Signed,  Morse Clause, PA-C 02-07-25, 7:29 AM   Addendum  Discussed with Dr. Michele and cardiology will sign off of the patient.  Addendum:  During morning rounds just went to see her given the course of events and see if husband had any questions.  However, patient had passed away.  Husband was not at bedside as he was making arrangements per RN.   Dr. Michele  "

## 2025-01-25 NOTE — TOC Transition Note (Signed)
 Transition of Care [facility]) - Discharge Note  Patient Details  Name: [name] MRN: [id number] Date of Birth: 07/23/1946  [facility] CM/SW Contact:  Duwaine GORMAN Aran, LCSW Phone Number: 2025/01/27, 1:05 PM  Clinical Narrative: Patient expired, so AIR will no longer be needed. Care management signing off.  Final next level of care: Expired Barriers to Discharge: No Barriers Identified  Patient Goals and CMS Choice Patient states their goals for this hospitalization and ongoing recovery are:: Home CMS Medicare.gov Compare Post Acute Care list provided to:: Patient Choice offered to / list presented to : Patient [place] ownership interest in [facility].provided to:: Patient   Discharge Plan and Services Additional resources added to the After Visit Summary for   In-house Referral: NA Discharge Planning Services: CM Consult Post Acute Care Choice: Durable Medical Equipment          DME Arranged: N/A DME Agency: NA HH Arranged: NA HH Agency: NA  Social Drivers of Health (SDOH) Interventions SDOH Screenings   Food Insecurity: No Food Insecurity (01/13/2025)  Housing: Low Risk (01/13/2025)  Recent Concern: Housing - At Risk (12/15/2024)  Transportation Needs: No Transportation Needs (01/13/2025)  Utilities: Not At Risk (01/13/2025)  Recent Concern: Utilities - At Risk (12/15/2024)  Depression (PHQ2-9): Low Risk (12/22/2024)  Social Connections: Unknown (01/13/2025)  Tobacco Use: Medium Risk (01/12/2025)   Readmission Risk Interventions    01/14/2025    3:31 PM 06/23/2022   11:27 AM  Readmission Risk Prevention Plan  Transportation Screening Complete Complete  PCP or Specialist Appt within 3-5 Days Complete   HRI or Home Care Consult Complete   Social Work Consult for Recovery Care Planning/Counseling Complete   Palliative Care Screening Not Applicable   Medication Review [employer]) Complete Complete  PCP or Specialist appointment within 3-5  days of discharge  Complete  HRI or Home Care Consult  Complete  SW Recovery Care/Counseling Consult  Complete  Palliative Care Screening  Not Applicable  Skilled Nursing Facility  Not Applicable

## 2025-01-25 NOTE — Progress Notes (Signed)
 No pulse. No respirations. Pronounced death with Lonell Alstrom RN. Husband at bedside. Dr Kathrin notified.

## 2025-01-25 DEATH — deceased

## 2025-01-26 ENCOUNTER — Inpatient Hospital Stay: Attending: Nurse Practitioner

## 2025-01-26 ENCOUNTER — Inpatient Hospital Stay: Admitting: Nurse Practitioner

## 2025-01-26 ENCOUNTER — Inpatient Hospital Stay

## 2025-01-26 ENCOUNTER — Inpatient Hospital Stay: Admitting: Hematology

## 2025-01-27 ENCOUNTER — Inpatient Hospital Stay

## 2025-01-28 ENCOUNTER — Inpatient Hospital Stay

## 2025-01-29 ENCOUNTER — Inpatient Hospital Stay

## 2025-01-30 ENCOUNTER — Inpatient Hospital Stay

## 2025-03-12 ENCOUNTER — Ambulatory Visit: Admitting: Physician Assistant

## 2025-03-23 ENCOUNTER — Ambulatory Visit: Admitting: Cardiology
